# Patient Record
Sex: Male | Born: 1945
Health system: Southern US, Community
[De-identification: ages and names within clinical notes are randomized; demographics above are authoritative.]

## PROBLEM LIST (undated history)

## (undated) DIAGNOSIS — N179 Acute kidney failure, unspecified: Secondary | ICD-10-CM

## (undated) DIAGNOSIS — N400 Enlarged prostate without lower urinary tract symptoms: Secondary | ICD-10-CM

## (undated) DIAGNOSIS — M549 Dorsalgia, unspecified: Secondary | ICD-10-CM

## (undated) DIAGNOSIS — A498 Other bacterial infections of unspecified site: Secondary | ICD-10-CM

## (undated) DIAGNOSIS — G4733 Obstructive sleep apnea (adult) (pediatric): Secondary | ICD-10-CM

## (undated) DIAGNOSIS — G709 Myoneural disorder, unspecified: Secondary | ICD-10-CM

## (undated) DIAGNOSIS — G5790 Unspecified mononeuropathy of unspecified lower limb: Secondary | ICD-10-CM

## (undated) DIAGNOSIS — I10 Essential (primary) hypertension: Secondary | ICD-10-CM

## (undated) DIAGNOSIS — F32A Depression, unspecified: Secondary | ICD-10-CM

## (undated) DIAGNOSIS — F329 Major depressive disorder, single episode, unspecified: Secondary | ICD-10-CM

## (undated) DIAGNOSIS — M1990 Unspecified osteoarthritis, unspecified site: Secondary | ICD-10-CM

## (undated) DIAGNOSIS — R600 Localized edema: Secondary | ICD-10-CM

## (undated) DIAGNOSIS — K439 Ventral hernia without obstruction or gangrene: Secondary | ICD-10-CM

## (undated) DIAGNOSIS — F419 Anxiety disorder, unspecified: Secondary | ICD-10-CM

## (undated) DIAGNOSIS — I509 Heart failure, unspecified: Secondary | ICD-10-CM

## (undated) DIAGNOSIS — M545 Low back pain, unspecified: Secondary | ICD-10-CM

## (undated) DIAGNOSIS — E7800 Pure hypercholesterolemia, unspecified: Secondary | ICD-10-CM

## (undated) DIAGNOSIS — I82409 Acute embolism and thrombosis of unspecified deep veins of unspecified lower extremity: Secondary | ICD-10-CM

## (undated) DIAGNOSIS — E119 Type 2 diabetes mellitus without complications: Secondary | ICD-10-CM

## (undated) DIAGNOSIS — J189 Pneumonia, unspecified organism: Secondary | ICD-10-CM

## (undated) DIAGNOSIS — I82411 Acute embolism and thrombosis of right femoral vein: Secondary | ICD-10-CM

## (undated) DIAGNOSIS — M2550 Pain in unspecified joint: Secondary | ICD-10-CM

## (undated) DIAGNOSIS — I2699 Other pulmonary embolism without acute cor pulmonale: Secondary | ICD-10-CM

## (undated) DIAGNOSIS — D689 Coagulation defect, unspecified: Secondary | ICD-10-CM

## (undated) DIAGNOSIS — R519 Headache, unspecified: Secondary | ICD-10-CM

## (undated) DIAGNOSIS — C801 Malignant (primary) neoplasm, unspecified: Secondary | ICD-10-CM

## (undated) DIAGNOSIS — I4892 Unspecified atrial flutter: Secondary | ICD-10-CM

## (undated) DIAGNOSIS — R300 Dysuria: Secondary | ICD-10-CM

## (undated) DIAGNOSIS — K649 Unspecified hemorrhoids: Secondary | ICD-10-CM

## (undated) DIAGNOSIS — M109 Gout, unspecified: Secondary | ICD-10-CM

## (undated) DIAGNOSIS — A080 Rotaviral enteritis: Secondary | ICD-10-CM

## (undated) HISTORY — DX: Pain in unspecified joint: M25.50

## (undated) HISTORY — DX: Acute embolism and thrombosis of right femoral vein: I82.411

## (undated) HISTORY — DX: Major depressive disorder, single episode, unspecified: F32.9

## (undated) HISTORY — DX: Acute kidney failure, unspecified: N17.9

## (undated) HISTORY — DX: Acute embolism and thrombosis of unspecified deep veins of unspecified lower extremity: I82.409

## (undated) HISTORY — DX: Pure hypercholesterolemia, unspecified: E78.00

## (undated) HISTORY — DX: Unspecified osteoarthritis, unspecified site: M19.90

## (undated) HISTORY — DX: Myoneural disorder, unspecified: G70.9

## (undated) HISTORY — PX: HERNIA REPAIR: SHX51

## (undated) HISTORY — PX: EYE SURGERY: SHX253

## (undated) HISTORY — DX: Depression, unspecified: F32.A

## (undated) HISTORY — DX: Other bacterial infections of unspecified site: A49.8

## (undated) HISTORY — DX: Type 2 diabetes mellitus without complications: E11.9

## (undated) HISTORY — DX: Ventral hernia without obstruction or gangrene: K43.9

## (undated) HISTORY — DX: Coagulation defect, unspecified: D68.9

## (undated) HISTORY — DX: Benign prostatic hyperplasia without lower urinary tract symptoms: N40.0

## (undated) HISTORY — DX: Localized edema: R60.0

## (undated) HISTORY — DX: Obstructive sleep apnea (adult) (pediatric): G47.33

## (undated) HISTORY — DX: Other pulmonary embolism without acute cor pulmonale: I26.99

## (undated) HISTORY — DX: Essential (primary) hypertension: I10

## (undated) HISTORY — DX: Headache, unspecified: R51.9

## (undated) HISTORY — DX: Rotaviral enteritis: A08.0

## (undated) HISTORY — DX: Gout, unspecified: M10.9

## (undated) HISTORY — DX: Dysuria: R30.0

## (undated) HISTORY — DX: Unspecified hemorrhoids: K64.9

## (undated) HISTORY — PX: HX HEMORRHOIDECTOMY: SHX153

---

## 1997-06-15 HISTORY — PX: HERNIA REPAIR: SHX51

## 1997-12-22 ENCOUNTER — Emergency Department (HOSPITAL_COMMUNITY): Admission: EM | Admit: 1997-12-22 | Discharge: 1997-12-22 | Payer: Self-pay | Admitting: Emergency Medicine

## 1997-12-24 ENCOUNTER — Encounter: Admission: RE | Admit: 1997-12-24 | Discharge: 1998-03-22 | Payer: Self-pay | Admitting: Internal Medicine

## 2001-10-06 ENCOUNTER — Encounter (INDEPENDENT_AMBULATORY_CARE_PROVIDER_SITE_OTHER): Payer: Self-pay | Admitting: Specialist

## 2001-10-06 ENCOUNTER — Encounter: Payer: Self-pay | Admitting: General Surgery

## 2001-10-06 ENCOUNTER — Ambulatory Visit (HOSPITAL_COMMUNITY): Admission: RE | Admit: 2001-10-06 | Discharge: 2001-10-06 | Payer: Self-pay | Admitting: General Surgery

## 2001-10-26 ENCOUNTER — Ambulatory Visit (HOSPITAL_COMMUNITY): Admission: RE | Admit: 2001-10-26 | Discharge: 2001-10-26 | Payer: Self-pay | Admitting: General Surgery

## 2001-10-26 ENCOUNTER — Encounter: Payer: Self-pay | Admitting: General Surgery

## 2003-06-21 ENCOUNTER — Ambulatory Visit (HOSPITAL_BASED_OUTPATIENT_CLINIC_OR_DEPARTMENT_OTHER): Admission: RE | Admit: 2003-06-21 | Discharge: 2003-06-21 | Payer: Self-pay | Admitting: Internal Medicine

## 2004-05-19 ENCOUNTER — Ambulatory Visit (HOSPITAL_COMMUNITY): Admission: RE | Admit: 2004-05-19 | Discharge: 2004-05-19 | Payer: Self-pay | Admitting: Ophthalmology

## 2004-05-26 ENCOUNTER — Ambulatory Visit (HOSPITAL_COMMUNITY): Admission: RE | Admit: 2004-05-26 | Discharge: 2004-05-26 | Payer: Self-pay | Admitting: Ophthalmology

## 2004-11-20 ENCOUNTER — Ambulatory Visit: Payer: Self-pay | Admitting: Internal Medicine

## 2004-12-02 ENCOUNTER — Ambulatory Visit: Payer: Self-pay | Admitting: Internal Medicine

## 2006-03-30 ENCOUNTER — Ambulatory Visit: Payer: Self-pay | Admitting: Internal Medicine

## 2006-03-30 LAB — CONVERTED CEMR LAB
ALT: 19 units/L (ref 0–40)
AST: 18 units/L (ref 0–37)
Albumin: 3.6 g/dL (ref 3.5–5.2)
Alkaline Phosphatase: 75 units/L (ref 39–117)
BUN: 16 mg/dL (ref 6–23)
Basophils Absolute: 0 10*3/uL (ref 0.0–0.1)
Basophils Relative: 0.5 % (ref 0.0–1.0)
Bilirubin Urine: NEGATIVE
CO2: 27 meq/L (ref 19–32)
Calcium: 8.9 mg/dL (ref 8.4–10.5)
Chloride: 106 meq/L (ref 96–112)
Chol/HDL Ratio, serum: 5
Cholesterol: 144 mg/dL (ref 0–200)
Creatinine, Ser: 1 mg/dL (ref 0.4–1.5)
Eosinophil percent: 1.5 % (ref 0.0–5.0)
GFR calc non Af Amer: 81 mL/min
Glomerular Filtration Rate, Af Am: 98 mL/min/{1.73_m2}
Glucose, Bld: 108 mg/dL — ABNORMAL HIGH (ref 70–99)
HCT: 42 % (ref 39.0–52.0)
HDL: 28.6 mg/dL — ABNORMAL LOW (ref >39.0)
Hemoglobin, Urine: NEGATIVE
Hemoglobin: 14.3 g/dL (ref 13.0–17.0)
Ketones, ur: NEGATIVE mg/dL
LDL Cholesterol: 78 mg/dL (ref 0–99)
Leukocytes, UA: NEGATIVE
Lymphocytes Relative: 19.8 % (ref 12.0–46.0)
MCHC: 34 g/dL (ref 30.0–36.0)
MCV: 88.6 fL (ref 78.0–100.0)
Monocytes Absolute: 0.3 10*3/uL (ref 0.2–0.7)
Monocytes Relative: 5.1 % (ref 3.0–11.0)
Neutro Abs: 5.1 10*3/uL (ref 1.4–7.7)
Neutrophils Relative %: 73.1 % (ref 43.0–77.0)
Nitrite: NEGATIVE
PSA: 1.36 ng/mL (ref 0.10–4.00)
Platelets: 198 10*3/uL (ref 150–400)
Potassium: 4.2 meq/L (ref 3.5–5.1)
RBC: 4.74 M/uL (ref 4.22–5.81)
RDW: 13.5 % (ref 11.5–14.6)
Sodium: 140 meq/L (ref 135–145)
Specific Gravity, Urine: 1.02 (ref 1.000–1.03)
TSH: 2.15 microintl units/mL (ref 0.35–5.50)
Total Bilirubin: 0.5 mg/dL (ref 0.3–1.2)
Total Protein, Urine: NEGATIVE mg/dL
Total Protein: 6.2 g/dL (ref 6.0–8.3)
Triglyceride fasting, serum: 187 mg/dL — ABNORMAL HIGH (ref 0–149)
Urine Glucose: NEGATIVE mg/dL
Urobilinogen, UA: 0.2 (ref 0.0–1.0)
VLDL: 37 mg/dL (ref 0–40)
WBC: 6.8 10*3/uL (ref 4.5–10.5)
pH: 6 (ref 5.0–8.0)

## 2006-04-05 ENCOUNTER — Ambulatory Visit: Payer: Self-pay | Admitting: Internal Medicine

## 2006-05-31 ENCOUNTER — Ambulatory Visit: Payer: Self-pay | Admitting: Internal Medicine

## 2006-06-22 ENCOUNTER — Ambulatory Visit: Payer: Self-pay | Admitting: Internal Medicine

## 2007-03-29 ENCOUNTER — Encounter: Payer: Self-pay | Admitting: Internal Medicine

## 2007-03-29 ENCOUNTER — Ambulatory Visit: Payer: Self-pay | Admitting: Internal Medicine

## 2007-03-29 DIAGNOSIS — L0390 Cellulitis, unspecified: Secondary | ICD-10-CM

## 2007-03-29 DIAGNOSIS — N400 Enlarged prostate without lower urinary tract symptoms: Secondary | ICD-10-CM | POA: Insufficient documentation

## 2007-03-29 DIAGNOSIS — M1710 Unilateral primary osteoarthritis, unspecified knee: Secondary | ICD-10-CM | POA: Insufficient documentation

## 2007-03-29 DIAGNOSIS — F329 Major depressive disorder, single episode, unspecified: Secondary | ICD-10-CM | POA: Insufficient documentation

## 2007-03-29 DIAGNOSIS — I10 Essential (primary) hypertension: Secondary | ICD-10-CM | POA: Insufficient documentation

## 2007-03-29 DIAGNOSIS — G4733 Obstructive sleep apnea (adult) (pediatric): Secondary | ICD-10-CM | POA: Insufficient documentation

## 2007-03-29 DIAGNOSIS — F411 Generalized anxiety disorder: Secondary | ICD-10-CM | POA: Insufficient documentation

## 2007-03-29 DIAGNOSIS — F32A Depression, unspecified: Secondary | ICD-10-CM | POA: Insufficient documentation

## 2007-03-29 DIAGNOSIS — M169 Osteoarthritis of hip, unspecified: Secondary | ICD-10-CM | POA: Insufficient documentation

## 2007-03-29 DIAGNOSIS — H409 Unspecified glaucoma: Secondary | ICD-10-CM | POA: Insufficient documentation

## 2007-03-29 DIAGNOSIS — Z8669 Personal history of other diseases of the nervous system and sense organs: Secondary | ICD-10-CM | POA: Insufficient documentation

## 2007-03-29 DIAGNOSIS — L0291 Cutaneous abscess, unspecified: Secondary | ICD-10-CM | POA: Insufficient documentation

## 2007-03-29 DIAGNOSIS — Z87898 Personal history of other specified conditions: Secondary | ICD-10-CM | POA: Insufficient documentation

## 2007-06-02 ENCOUNTER — Encounter: Payer: Self-pay | Admitting: Internal Medicine

## 2007-06-03 ENCOUNTER — Telehealth: Payer: Self-pay | Admitting: Internal Medicine

## 2007-06-29 ENCOUNTER — Encounter: Payer: Self-pay | Admitting: *Deleted

## 2007-06-29 DIAGNOSIS — Z9889 Other specified postprocedural states: Secondary | ICD-10-CM | POA: Insufficient documentation

## 2007-06-29 DIAGNOSIS — M722 Plantar fascial fibromatosis: Secondary | ICD-10-CM | POA: Insufficient documentation

## 2007-09-13 ENCOUNTER — Ambulatory Visit: Payer: Self-pay | Admitting: Internal Medicine

## 2007-09-13 LAB — CONVERTED CEMR LAB
ALT: 18 units/L (ref 0–53)
AST: 19 units/L (ref 0–37)
Albumin: 3.3 g/dL — ABNORMAL LOW (ref 3.5–5.2)
Alkaline Phosphatase: 78 units/L (ref 39–117)
BUN: 17 mg/dL (ref 6–23)
Bacteria, UA: NEGATIVE
Basophils Absolute: 0 10*3/uL (ref 0.0–0.1)
Basophils Relative: 0.3 % (ref 0.0–1.0)
Bilirubin Urine: NEGATIVE
Bilirubin, Direct: 0.1 mg/dL (ref 0.0–0.3)
CO2: 30 meq/L (ref 19–32)
Calcium: 9 mg/dL (ref 8.4–10.5)
Chloride: 104 meq/L (ref 96–112)
Cholesterol: 133 mg/dL (ref 0–200)
Creatinine, Ser: 1 mg/dL (ref 0.4–1.5)
Crystals: NEGATIVE
Direct LDL: 65.9 mg/dL
Eosinophils Absolute: 0.2 10*3/uL (ref 0.0–0.7)
Eosinophils Relative: 2.1 % (ref 0.0–5.0)
GFR calc Af Amer: 97 mL/min
GFR calc non Af Amer: 80 mL/min
Glucose, Bld: 107 mg/dL — ABNORMAL HIGH (ref 70–99)
HCT: 43.1 % (ref 39.0–52.0)
HDL: 29 mg/dL — ABNORMAL LOW (ref >39.0)
Hemoglobin, Urine: NEGATIVE
Hemoglobin: 14 g/dL (ref 13.0–17.0)
Ketones, ur: NEGATIVE mg/dL
Leukocytes, UA: NEGATIVE
Lymphocytes Relative: 23.1 % (ref 12.0–46.0)
MCHC: 32.4 g/dL (ref 30.0–36.0)
MCV: 86.2 fL (ref 78.0–100.0)
Monocytes Absolute: 0.5 10*3/uL (ref 0.1–1.0)
Monocytes Relative: 6.9 % (ref 3.0–12.0)
Mucus, UA: NEGATIVE
Neutro Abs: 5.4 10*3/uL (ref 1.4–7.7)
Neutrophils Relative %: 67.6 % (ref 43.0–77.0)
Nitrite: NEGATIVE
PSA: 1.09 ng/mL (ref 0.10–4.00)
Platelets: 193 10*3/uL (ref 150–400)
Potassium: 4 meq/L (ref 3.5–5.1)
RBC: 5 M/uL (ref 4.22–5.81)
RDW: 13.5 % (ref 11.5–14.6)
Sodium: 143 meq/L (ref 135–145)
Specific Gravity, Urine: 1.02 (ref 1.000–1.03)
TSH: 2.28 microintl units/mL (ref 0.35–5.50)
Total Bilirubin: 0.7 mg/dL (ref 0.3–1.2)
Total CHOL/HDL Ratio: 4.6
Total Protein, Urine: 30 mg/dL — AB
Total Protein: 6.5 g/dL (ref 6.0–8.3)
Triglycerides: 219 mg/dL (ref 0–149)
Urine Glucose: NEGATIVE mg/dL
Urobilinogen, UA: 0.2 (ref 0.0–1.0)
VLDL: 44 mg/dL — ABNORMAL HIGH (ref 0–40)
WBC: 7.9 10*3/uL (ref 4.5–10.5)
pH: 6.5 (ref 5.0–8.0)

## 2007-09-16 ENCOUNTER — Ambulatory Visit: Payer: Self-pay | Admitting: Internal Medicine

## 2007-09-16 DIAGNOSIS — G4700 Insomnia, unspecified: Secondary | ICD-10-CM | POA: Insufficient documentation

## 2010-10-31 NOTE — Op Note (Signed)
[facility]  Patient:    AZARI, HASLER Visit Number: [id number] MRN: [id number]          Service Type: DSU Location: DAY Attending Physician:  Carson Myrtle Dictated by:   Sheppard Plumber Earlene Plater, M.D. Proc. Date: 10/06/01 Admit Date:  10/06/2001   CC:         Buren Kos, M.D.   Operative Report  PREOPERATIVE DIAGNOSIS:  Incarcerated umbilical hernia.  POSTOPERATIVE DIAGNOSIS:  Incarcerated umbilical hernia.  PROCEDURE:  Repair of incarcerated umbilical hernia with mesh.  SURGEON:  Timothy E. Earlene Plater, M.D.  ANESTHESIA:  General.  HISTORY:  Mr. Gunning is 65 years old, is rather significantly obese with a large abdominal girth. He is a heavy smoker and has an incarcerated umbilical hernia. After careful discussion, he wishes to have a repair. His preoperative chest x-ray is abnormal and will be followed up.  DESCRIPTION OF PROCEDURE:  The patient was evaluated by anesthesia, taken to the operating room, placed supine, general endotracheal anesthesia administered. The abdomen was shaved, scrubbed, prepped and draped in the usual fashion. A large incarcerated umbilical hernia was present. The area was anesthetized with 0.25% Marcaine with epinephrine. A supraumbilical curvilinear incision made. The hernia was in the subcutaneous tissue. It was dissected from the surrounding tissue down to its neck. The actual hernia defect was approximately 1.5 to 2 cm. The hernia contents were incarcerated, the sac was opened. The omentum was dissected off of the hernia sac and reduced into the abdomen. The hernia sac was excised. A circular portion of mesh was placed in the defect and anchored with four sutures of 0 Prolene to the surrounding strong fascia and then a running 2-0 Prolene was placed circling the mesh and attaching it to the fascia. With this, the procedure was complete. The deep subcutaneous was closed with 3-0 Monocryl and the skin with 3-0  Monocryl subcuticular. Steri-Strips were applied. A formed dressing was placed into the umbilical defect. He tolerated it well, was awakened and taken to the recovery room in good condition.  Written and verbal instructions were given including Percocet #36 and he will be followed as an outpatient. Dictated by:   Sheppard Plumber Earlene Plater, M.D. Attending Physician:  Carson Myrtle DD:  10/06/01 TD:  10/06/01 Job: [id number]

## 2010-10-31 NOTE — Assessment & Plan Note (Signed)
[facility]                             PRIMARY CARE OFFICE NOTE   NAME:[name] [name] MAHRT                       MRN:          [id number]  DATE:04/05/2006                            DOB:          03-25-1946    [name] [name] is a 65 year old Caucasian gentleman who is followed for:  1. History of right facial hemiparesis secondary to Bell's palsy.  2. History of glaucoma and status post iridotomies.  3. Obstructive sleep apnea, rated as mild by sleep study.  4. Hammertoe difficulties with painful feet.  5. The patient had a mild depression that was situational in nature.  6. Benign prostatic hypertrophy.  7. Obesity.   CHIEF COMPLAINT:  1. Easy bruising.  2. Pruritus in the EAC's. Swelling of the AC's and decreased hearing.   PAST MEDICAL HISTORY:   SURGICAL:  1. 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  2. Stress fracture left foot.  3. Umbilical hernia repair.   MEDICAL:  1. Usual childhood diseases.  2. Plantar fasciitis.  3. Degenerative joint disease knee and hip.  4. Benign prostatic hypertrophy.  5. Obstructive sleep apnea.  6. Obesity.  7. Anxiety with depression.   CURRENT MEDICATIONS:  1. Multivitamin daily.  2. Aspirin 81 mg daily.  3. Vitamin E 400 international units daily.  4. Wellbutrin XL 300 mg daily.  5. Avodart 0.5 mg daily.  6. Meloxicam 7.5 mg daily.  7. Glucosamine daily.   FAMILY HISTORY:  Positive for arthritis in his mother. Positive secondary  kinship for heart disease and stroke. Positive for diabetes in his mother.   SOCIAL HISTORY:  The patient has had a distinguished armed forces career.  The patient is married 38 years. He has two sons, ages 32 and 59. He has  three grandchildren. 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 He reports he is now restarting a  similar business working with his  son.   HABITS:  The patient has a greater than 40-pack year smoking history, but  has quit cigarettes. He rarely uses alcohol.   ALLERGIES:  No known drug allergies.   In my chart review, the patient had an MRI of the brain due to Bell's palsy,  which was read out showing abnormal asymmetric enhancement of the right  facial nerve associated with Bell's palsy. No other abnormalities were  otherwise noted.   REVIEW OF SYSTEMS:  The patient has had no fevers, sweats or chills. His  weight fluctuated some during the year, but he wound up with a net 1 pound  weight loss from last year. No opthalmic, ENT, cardiovascular, respiratory,  GI or GU complaints.   PHYSICAL EXAMINATION:  Temperature: 98.5. Blood pressure: 164/87. Pulse: 96.  Weight: 297.  GENERAL APPEARANCE: This is an obese, Caucasian male who looks his stated  age in no acute distress.  HEENT: Normocephalic, atraumatic. EAC's with cerumen, which irrigated  easily. Oropharynx  with native dentition with no lesions noted. The patient  has obvious lid lag on the right. Conjunctivae and sclerae was clear on both  eyes. Pupils equal, round and reactive to light and accommodation.  Funduscopic examination reveals normal disc margins with no abnormality.  Neck was supple without thyromegaly.  NODES: No adenopathy was noted in the cervical or supraclavicular regions.  CHEST: No costovertebral angle tenderness. The patient has mild gynecomastia  secondary to obesity.  LUNGS:  Clear to auscultation and percussion.  CARDIOVASCULAR: 2+ radial pulses. No JVD or carotid bruits. He had a quiet  precordium with regular rate and rhythm without murmurs, rubs or gallops.  ABDOMEN: Obese, soft. No guarding or rebound. Palpation was hindered by his  girth. He had no tenderness, no rebound, no guarding.  RECTAL: Revealed the patient to have external skin tags. He had normal  sphincter tone. Prostate was smooth,  round, normal in size and contour  without nodules.  EXTREMITIES: Without clubbing, cyanosis, edema and no deformities were  noted.  DERM: The patient has some small bruises on his upper extremities, which  appear to be capillary in nature with no significant deep bruising.  NEUROLOGIC: Was nonfocal.   DATABASE: Hemoglobin 14.3 grams. White count was 6,800 with a normal  differential. Cholesterol was 144, triglycerides 187, HDL 28.6, LDL was 78.  Chemistries revealed glucose 108, electrolytes were normal. Creatinine 1.0.  Liver functions were normal. A TSH normal at 2.15. PSA was normal at 1.36,  down from 3.30. Urinalysis was negative.   ASSESSMENT/PLAN:  1. Benign prostatic hypertrophy: Patient with significant improvement in      symptoms on Avodart which I believe is responsible for his drop in his      PSA.  Plan: The patient is to continue his present regimen.  2. Obstructive sleep apnea: The patient has not ever had consultation or      treatment. In reviewing his sleep study, he was read out as having mild-      moderate obstructive sleep apnea. The patient is asymptomatic at this      time. Plan: Weight loss. The patient is instructed if his symptoms      become worse including increased daytime somnolence then referral to a      sleep specialist would be appropriate.  3. Anxiety/depression: The patient reports he is doing well with      Wellbutrin XL 300 mg once daily and would like to continue the same.  4. Plantar fasciitis and foot pain: The patient is well-controlled on      meloxicam at this time.  5. Obesity: Discussed at length with the patient in regards to need for      weight loss. Counseled him on the need for daily exercise even if 15-20      minutes including just plain walking. He also is instructed in smart      food choices and in portion size restriction. Weight loss goal was one      pound per month on a steady and regular basis. 6. Neurology: The patient  with stable Bell's palsy. There has been no      change in his facial droop. At this point this is most likely a      permanent deficit. No further evaluation is needed at this time.  7. Health maintenance: I could not locate a colonoscopy on the chart and      the patient would be a good candidate for colonoscopy for  routine      colorectal cancer screening. The patient otherwise is currently up-to-      date with health screening. The patient did have a 12-lead      electrocardiogram today which revealed incomplete right bundle branch      block, possible left atrial enlargement and otherwise was unremarkable.   In summary, this is a very pleasant gentleman with his medical problems  essentially being stable. His biggest medical problem at this point is his  weight.   The patient did have his ears irrigated today as it was no trouble.            ______________________________  Rosalyn Gess. Norins, MD      MEN/MedQ  DD:  04/05/2006  DT:  04/06/2006  Job #:  [id number]

## 2011-08-21 ENCOUNTER — Emergency Department (HOSPITAL_COMMUNITY)
Admission: EM | Admit: 2011-08-21 | Discharge: 2011-08-21 | Disposition: A | Discharge status: Home / Self Care | Payer: Medicare Other | Attending: Emergency Medicine | Admitting: Emergency Medicine

## 2011-08-21 ENCOUNTER — Encounter (HOSPITAL_COMMUNITY): Payer: Self-pay | Admitting: Emergency Medicine

## 2011-08-21 DIAGNOSIS — I10 Essential (primary) hypertension: Secondary | ICD-10-CM | POA: Insufficient documentation

## 2011-08-21 DIAGNOSIS — M533 Sacrococcygeal disorders, not elsewhere classified: Secondary | ICD-10-CM | POA: Insufficient documentation

## 2011-08-21 DIAGNOSIS — M25559 Pain in unspecified hip: Secondary | ICD-10-CM | POA: Insufficient documentation

## 2011-08-21 HISTORY — DX: Essential (primary) hypertension: I10

## 2011-08-21 MED ORDER — HYDROCODONE-ACETAMINOPHEN 5-325 MG PO TABS: 1.0000 | ORAL_TABLET | ORAL | Status: AC | PRN

## 2011-08-21 MED ORDER — PREDNISONE 10 MG PO TABS: 40.0000 mg | ORAL_TABLET | Freq: Every day | ORAL | Status: DC

## 2011-08-21 NOTE — Discharge Instructions (Signed)
Sacroiliac Joint Dysfunction The sacroiliac joint connects the lower part of the spine (the sacrum) with the bones of the pelvis. CAUSES  Sometimes, there is no obvious reason for sacroiliac joint dysfunction. Other times, it may occur   During pregnancy.   After injury, such as:   Car accidents.   Sport-related injuries.   Work-related injuries.   Due to one leg being shorter than the other.   Due to other conditions that affect the joints, such as:   Rheumatoid arthritis.   Gout.   Psoriasis.   Joint infection (septic arthritis).  SYMPTOMS  Symptoms may include:  Pain in the:   Lower back.   Buttocks.   Groin.   Thighs and legs.   Difficult sitting, standing, walking, lying, bending or lifting.  DIAGNOSIS  A number of tests may be used to help diagnose the cause of sacroiliac joint dysfunction, including:  Imaging tests to look for other causes of pain, including:   MRI.   CT scan.   Bone scan.   Diagnostic injection: During a special x-ray (called fluoroscopy), a needle is put into the sacroiliac joint. A numbing medicine is injected into the joint. If the pain is improved or stopped, the diagnosis of sacroiliac joint dysfunction is more likely.  TREATMENT  There are a number of types of treatment used for sacroiliac joint dysfunction, including:  Only take over-the-counter or prescription medicines for pain, discomfort, or fever as directed by your caregiver.   Medications to relax muscles.   Rest. Decreasing activity can help cut down on painful muscle spasms and allow the back to heal.   Application of heat or ice to the lower back may improve muscle spasms and soothe pain.   Brace. A special back brace, called a sacroiliac belt, can help support the joint while your back is healing.   Physical therapy can help teach comfortable positions and exercises to strengthen muscles that support the sacroiliac joint.   Cortisone injections. Injections  of steroid medicine into the joint can help decrease swelling and improve pain.   Hyaluronic acid injections. This chemical improves lubrication within the sacroiliac joint, thereby decreasing pain.   Radiofrequency ablation. A special needle is placed into the joint, where it burns away nerves that are carrying pain messages from the joint.   Surgery. Because pain occurs during movement of the joint, screws and plates may be installed in order to limit or prevent joint motion.  HOME CARE INSTRUCTIONS   Take all medications exactly as directed.   Follow instructions regarding both rest and physical activity, to avoid worsening the pain.   Do physical therapy exercises exactly as prescribed.  SEEK IMMEDIATE MEDICAL CARE IF:  You experience increasingly severe pain.   You develop new symptoms, such as numbness or tingling in your legs or feet.   You lose bladder or bowel control.  Document Released: 08/28/2008 Document Revised: 05/21/2011 Document Reviewed: 08/28/2008 Exit[employer] Patient Information 2012 [employer]. 

## 2011-08-21 NOTE — ED Notes (Signed)
Pt goes to [place] for epidurals to relieve pain. C/o sacroiliac pain radiating down rt leg.

## 2011-08-21 NOTE — ED Provider Notes (Signed)
Medical screening examination/treatment/procedure(s) were performed by non-physician practitioner and as supervising physician I was immediately available for consultation/collaboration.  Ethelda Chick, MD 08/21/11 [phone]

## 2011-08-21 NOTE — ED Provider Notes (Signed)
History     CSN: [id number]  Arrival date & time 08/21/11  1028   First MD Initiated Contact with Patient 08/21/11 1209      Chief Complaint  Patient presents with  . Hip Pain    (Consider location/radiation/quality/duration/timing/severity/associated sxs/prior treatment) HPI Comments: Patient reports that three days ago he began having pain of the right buttocks.  He reports that the pain radiates down his right posterior leg.  Pain is constant and is worse with palpation.  No trauma or injury.  No fever/chills.  No erythema or swelling.  No numbness or tingling.  He is able to ambulate and move both of his hips.  He states he has a history of arthritis in his lower back.  He states that he is seen by the [place] in [place] and has been given epidural shots for his back pain.  Patient denies any bowel or urinary incontinence.    Patient is a 66 y.o. male presenting with hip pain. The history is provided by the patient.  Hip Pain This is a new problem. Episode onset: approximately 3 days ago. Pertinent negatives include no chills, diaphoresis, fever, joint swelling, neck pain, numbness, rash, vomiting or weakness. The symptoms are aggravated by walking. He has tried NSAIDs for the symptoms. The treatment provided mild relief.    Past Medical History  Diagnosis Date  . Hypertension     History reviewed. No pertinent past surgical history.  History reviewed. No pertinent family history.  History  Substance Use Topics  . Smoking status: Never Smoker   . Smokeless tobacco: Not on file  . Alcohol Use: Yes     occasional      Review of Systems  Constitutional: Negative for fever, chills and diaphoresis.  HENT: Negative for neck pain and neck stiffness.   Gastrointestinal: Negative for vomiting.  Genitourinary: Negative for hematuria and decreased urine volume.  Musculoskeletal: Positive for back pain. Negative for joint swelling and gait problem.  Skin: Negative for color change,  rash and wound.  Neurological: Negative for weakness and numbness.    Allergies  Codeine sulfate  Home Medications   Current Outpatient Rx  Name Route Sig Dispense Refill  . ASPIRIN EC 81 MG PO TBEC Oral Take 81 mg by mouth every evening.    Marland Kitchen BUPROPION HCL ER (SR) 150 MG PO TB12 Oral Take 150 mg by mouth 2 (two) times daily.    Marland Kitchen FINASTERIDE 5 MG PO TABS Oral Take 5 mg by mouth at bedtime.    Marland Kitchen GABAPENTIN 300 MG PO CAPS Oral Take 300 mg by mouth daily.    Marland Kitchen HYDROCODONE-ACETAMINOPHEN 7.5-750 MG PO TABS Oral Take 1 tablet by mouth every 6 (six) hours as needed. For pain    . THERA M PLUS PO TABS Oral Take 1 tablet by mouth daily.    . TRAMADOL HCL 50 MG PO TABS Oral Take 100 mg by mouth 3 (three) times daily.    Marland Kitchen ZOLPIDEM TARTRATE 10 MG PO TABS Oral Take 10 mg by mouth at bedtime as needed. For sleep      BP 139/76  Pulse 89  Temp 98.8 F (37.1 C)  Resp 20  SpO2 95%  Physical Exam  Nursing note and vitals reviewed. Constitutional: He is oriented to person, place, and time. He appears well-developed and well-nourished. No distress.  HENT:  Head: Normocephalic and atraumatic.  Neck: Normal range of motion. Neck supple.  Cardiovascular: Normal rate, regular rhythm and normal heart sounds.  Pulmonary/Chest: Effort normal and breath sounds normal.  Musculoskeletal: Normal range of motion. He exhibits no edema and no tenderness.       Right hip: He exhibits normal range of motion, no tenderness, no bony tenderness and no deformity.       Left hip: He exhibits normal range of motion, normal strength, no bony tenderness, no swelling and no deformity.       Tenderness to palpation of the posterior SI joint.  No erythema, edema, or warmth to the touch.    Neurological: He is alert and oriented to person, place, and time.  Skin: Skin is warm and dry. He is not diaphoretic. No erythema.  Psychiatric: He has a normal mood and affect.    ED Course  Procedures (including critical care  time)  Labs Reviewed - No data to display No results found.   No diagnosis found.    MDM  Patient with pain over the posterior SI joint.  Patient given short course of prednisone and instructed to follow up with his PCP.  No acute trauma or injury, therefore, did not order xrays.          Pascal [place], PA-C 08/21/11 [phone]

## 2011-08-21 NOTE — ED Notes (Signed)
Pt c/o right hip pain with radiation down right leg starting yesterday; pt denies hx of same; pt sts hx of chronic back pain

## 2013-02-12 ENCOUNTER — Ambulatory Visit (INDEPENDENT_AMBULATORY_CARE_PROVIDER_SITE_OTHER): Payer: Medicare Other | Admitting: Family Medicine

## 2013-02-12 ENCOUNTER — Ambulatory Visit (HOSPITAL_COMMUNITY)
Admission: RE | Admit: 2013-02-12 | Discharge: 2013-02-12 | Disposition: A | Discharge status: Home / Self Care | Payer: Medicare Other | Source: Ambulatory Visit | Attending: Family Medicine | Admitting: Family Medicine

## 2013-02-12 VITALS — BP 138/84 | HR 84 | Temp 98.2°F | Resp 16 | Ht 66.5 in | Wt 281.8 lb

## 2013-02-12 DIAGNOSIS — I824Y9 Acute embolism and thrombosis of unspecified deep veins of unspecified proximal lower extremity: Secondary | ICD-10-CM | POA: Insufficient documentation

## 2013-02-12 DIAGNOSIS — I82402 Acute embolism and thrombosis of unspecified deep veins of left lower extremity: Secondary | ICD-10-CM

## 2013-02-12 DIAGNOSIS — M7989 Other specified soft tissue disorders: Secondary | ICD-10-CM

## 2013-02-12 DIAGNOSIS — M79609 Pain in unspecified limb: Secondary | ICD-10-CM | POA: Insufficient documentation

## 2013-02-12 DIAGNOSIS — I82409 Acute embolism and thrombosis of unspecified deep veins of unspecified lower extremity: Secondary | ICD-10-CM

## 2013-02-12 DIAGNOSIS — G629 Polyneuropathy, unspecified: Secondary | ICD-10-CM

## 2013-02-12 DIAGNOSIS — G589 Mononeuropathy, unspecified: Secondary | ICD-10-CM

## 2013-02-12 LAB — COMPREHENSIVE METABOLIC PANEL WITH GFR
AST: 15 U/L (ref 0–37)
Albumin: 4.3 g/dL (ref 3.5–5.2)
Alkaline Phosphatase: 91 U/L (ref 39–117)
Glucose, Bld: 88 mg/dL (ref 70–99)
Potassium: 3.7 meq/L (ref 3.5–5.3)
Sodium: 138 meq/L (ref 135–145)
Total Bilirubin: 0.8 mg/dL (ref 0.3–1.2)
Total Protein: 6.7 g/dL (ref 6.0–8.3)

## 2013-02-12 LAB — POCT CBC
Granulocyte percent: 72.8 % (ref 37–80)
HCT, POC: 50.6 % (ref 43.5–53.7)
Hemoglobin: 16.5 g/dL (ref 14.1–18.1)
Lymph, poc: 2.2 (ref 0.6–3.4)
MCH, POC: 29.6 pg (ref 27–31.2)
MCHC: 32.6 g/dL (ref 31.8–35.4)
MCV: 90.9 fL (ref 80–97)
MID (cbc): 0.7 (ref 0–0.9)
MPV: 8.2 fL (ref 0–99.8)
POC Granulocyte: 7.6 — AB (ref 2–6.9)
POC LYMPH PERCENT: 20.9 % (ref 10–50)
POC MID %: 6.3 % (ref 0–12)
Platelet Count, POC: 240 10*3/uL (ref 142–424)
RBC: 5.57 M/uL (ref 4.69–6.13)
RDW, POC: 15.1 %
WBC: 10.5 10*3/uL — AB (ref 4.6–10.2)

## 2013-02-12 LAB — COMPREHENSIVE METABOLIC PANEL
ALT: 18 U/L (ref 0–53)
BUN: 23 mg/dL (ref 6–23)
CO2: 34 mEq/L — ABNORMAL HIGH (ref 19–32)
Calcium: 9.6 mg/dL (ref 8.4–10.5)
Chloride: 95 mEq/L — ABNORMAL LOW (ref 96–112)
Creat: 1.24 mg/dL (ref 0.50–1.35)

## 2013-02-12 LAB — POCT URINALYSIS DIPSTICK
Bilirubin, UA: NEGATIVE
Blood, UA: NEGATIVE
Glucose, UA: NEGATIVE
Ketones, UA: NEGATIVE
Leukocytes, UA: NEGATIVE
Nitrite, UA: NEGATIVE
Spec Grav, UA: >=1.03
Urobilinogen, UA: 0.2
pH, UA: 5.5

## 2013-02-12 MED ORDER — RIVAROXABAN 20 MG PO TABS: 20.0000 mg | ORAL_TABLET | Freq: Every day | ORAL | Status: DC

## 2013-02-12 MED ORDER — RIVAROXABAN 15 MG PO TABS: 15.0000 mg | ORAL_TABLET | Freq: Two times a day (BID) | ORAL | Status: DC

## 2013-02-12 NOTE — Patient Instructions (Addendum)
Go to [facility] ER and let them know that you are there for a Venous Doppler.Deep Vein Thrombosis A deep vein thrombosis (DVT) is a blood clot that develops in a deep vein. A DVT is a clot in the deep, larger veins of the leg, arm, or pelvis. These are more dangerous than clots that might form in veins near the surface of the body. A DVT can lead to complications if the clot breaks off and travels in the bloodstream to the lungs.  A DVT can damage the valves in your leg veins, so that instead of flowing upwards, the blood pools in the lower leg. This is called post-thrombotic syndrome, and can result in pain, swelling, discoloration, and sores on the leg. Once identified, a DVT can be treated. It can also be prevented in some circumstances. Once you have had a DVT, you may be at increased risk for a DVT in the future. CAUSES Blood clots form in a vein for different reasons. Usually several things contribute to blood clots. Contributing factors include:  The flow of blood slows down.  The inside of the vein is damaged in some way.  The person has a condition that makes blood clot more easily. Some people are more likely than others to develop blood clots. That is because they have more factors that make clots likely. These are called risk factors. Risk factors include:   Older age, especially over 45 years old.  Having a history of blood clots. This means you have had one before. Or, it means that someone else in your family has had blood clots. You may have a genetic tendency to form clots.  Having major or lengthy surgery. This is especially true for surgery on the hip, knee, or belly (abdomen). Hip surgery is particularly high risk.  Breaking a hip or leg.  Sitting or lying still for a long time. This includes long distance travel, paralysis, or recovery from an illness or surgery.  Cancer, or cancer treatment.  Having a long, thin tube (catheter) placed inside a vein during a medical  procedure.  Being overweight (obese).  Pregnancy and childbirth. Hormone changes make the blood clot more easily during pregnancy. The fetus puts pressure on the veins of the pelvis. There is also risk of injury to veins during delivery or a caesarean. The risk is at its highest just after childbirth.  Medicines with the male hormone estrogen. This includes birth control pills and hormone replacement therapy.  Smoking.  Other circulation or heart problems. SYMPTOMS When a clot forms, it can either partially or totally block the blood flow in that vein. Symptoms of a DVT can include:  Swelling of the leg or arm, especially if one side is much worse.  Warmth and redness of the leg or arm, especially if one side is much worse.  Pain in an arm or leg. If the clot is in the leg, symptoms may be more noticeable or worse when standing or walking. The symptoms of a DVT that has traveled to the lungs (pulmonary embolism, PE) usually start suddenly, and include:  Shortness of breath.  Coughing.  Coughing up blood or blood-tinged phlegm.  Chest pain. The chest pain is often worse with deep breaths.  Rapid heartbeat. Anyone with these symptoms should get emergency medical treatment right away. Call your local emergency services (911 in [place] if you have these symptoms. DIAGNOSIS If a DVT is suspected, your caregiver will take a full medical history and carry out  a physical exam. Tests that also may be required include:  Blood tests, including studies of the clotting properties of the blood.  Ultrasonography to see if you have clots in your legs or lungs.  X-rays to show the flow of blood when dye is injected into the veins (venography).  Studies of your lungs, if you have any chest symptoms. PREVENTION  Exercise the legs regularly. Take a brisk 30 minute walk every day.  Maintain a weight that is appropriate for your height.  Avoid sitting or lying in bed for long periods of time  without moving your legs.  Women, particularly those over the age of 52, should consider the risks and benefits of taking estrogen medicines, including birth control pills.  Do not smoke, especially if you take estrogen medicines.  Long distance travel can increase your risk of DVT. You should exercise your legs by walking or pumping the muscles every hour.  In-hospital prevention:  Many of the risk factors above relate to situations that exist with hospitalization, either for illness, injury, or elective surgery.  Your caregiver will assess you for the need for venous thromboembolism prophylaxis when you are admitted to the hospital. If you are having surgery, your surgeon will assess you the day of or day after surgery.  Prevention may include medical and nonmedical measures. TREATMENT Treatment for DVT helps prevent death and disability. The most common treatment for DVT is blood thinning (anticoagulant) medicine, which reduces the blood's tendency to clot. Anticoagulants can stop new blood clots from forming and old ones from growing. They cannot dissolve existing clots. Your body does this by itself over time. Anticoagulants can be given by mouth, by intravenous (IV) access, or by injection. Your caregiver will determine the best program for you.  Heparin or related medicines (low molecular weight heparin) are usually the first treatment for a blood clot. They act quickly. However, they cannot be taken orally.  Heparin can cause a fall in a component of blood that stops bleeding and forms blood clots (platelets). You will be monitored with blood tests to be sure this does not occur.  Warfarin is an anticoagulant that can be swallowed (taken orally). It takes a few days to start working, so usually heparin or related medicines are used in combination. Once warfarin is working, heparin is usually stopped.  Less commonly, clot dissolving drugs (thrombolytics) are used to dissolve a DVT.  They carry a high risk of bleeding, so they are used mainly in severe cases, where a life or limb is threatened.  Very rarely, a blood clot in the leg needs to be removed surgically.  If you are unable to take anticoagulants, your caregiver may arrange for you to have a filter placed in a main vein in your belly (abdomen). This filter prevents clots from traveling to your lungs. HOME CARE INSTRUCTIONS  Take all medicines prescribed by your caregiver. Follow the directions carefully.  Warfarin. Most people will continue taking warfarin after hospital discharge. Your caregiver will advise you on the length of treatment (usually 3 6 months, sometimes lifelong).  Too much and too little warfarin are both dangerous. Too much warfarin increases the risk of bleeding. Too little warfarin continues to allow the risk for blood clots. While taking warfarin, you will need to have regular blood tests to measure your blood clotting time. These blood tests usually include both the prothrombin time (PT) and international normalized ratio (INR) tests. The PT and INR results allow your caregiver to adjust  your dose of warfarin. The dose can change for many reasons. It is critically important that you take warfarin exactly as prescribed, and that you have your PT and INR levels drawn exactly as directed.  Many foods, especially foods high in vitamin K can interfere with warfarin and affect the PT and INR results. Foods high in vitamin K include spinach, kale, broccoli, cabbage, collard and turnip greens, brussels sprouts, peas, cauliflower, seaweed, and parsley as well as beef and pork liver, green tea, and soybean oil. You should eat a consistent amount of foods high in vitamin K. Avoid major changes in your diet, or notify your caregiver before changing your diet. Arrange a visit with a dietitian to answer your questions.  Many medicines can interfere with warfarin and affect the PT and INR results. You must tell your  caregiver about any and all medicines you take, this includes all vitamins and supplements. Be especially cautious with aspirin and anti-inflammatory medicines. Ask your caregiver before taking these. Do not take or discontinue any prescribed or over-the-counter medicine except on the advice of your caregiver or pharmacist.  Warfarin can have side effects, primarily excessive bruising or bleeding. You will need to hold pressure over cuts for longer than usual. Your caregiver or pharmacist will discuss other potential side effects.  Alcohol can change the body's ability to handle warfarin. It is best to avoid alcoholic drinks or consume only very small amounts while taking warfarin. Notify your caregiver if you change your alcohol intake.  Notify your dentist or other caregivers before procedures.  Activity. Ask your caregiver how soon you can go back to normal activities. It is important to stay active to prevent blood clots. If you are on anticoagulant medicine, avoid contact sports.  Exercise. It is very important to exercise. This is especially important while traveling, sitting or standing for long periods of time. Exercise your legs by walking or by pumping the muscles frequently. Take frequent walks.  Compression stockings. These are tight elastic stockings that apply pressure to the lower legs. This pressure can help keep the blood in the legs from clotting. You may need to wear compressions stockings at home to help prevent a DVT.  Smoking. If you smoke, quit. Ask your caregiver for help with quitting smoking.  Learn as much as you can about DVT. Knowing more about the condition should help you keep it from coming back.  Wear a medical alert bracelet or carry a medical alert card. SEEK MEDICAL CARE IF:  You notice a rapid heartbeat.  You feel weaker or more tired than usual.  You feel faint.  You notice increased bruising.  You feel your symptoms are not getting better in the  time expected.  You believe you are having side effects of medicine. SEEK IMMEDIATE MEDICAL CARE IF:  You have chest pain.  You have trouble breathing.  You have new or increased swelling or pain in one leg.  You cough up blood.  You notice blood in vomit, in a bowel movement, or in urine. MAKE SURE YOU:  Understand these instructions.  Will watch your condition.  Will get help right away if you are not doing well or get worse. Document Released: 06/01/2005 Document Revised: 02/24/2012 Document Reviewed: 07/24/2010 [facility] Patient Information 2014 [place], [place].

## 2013-02-12 NOTE — Progress Notes (Signed)
[facility]:  Office Visit  Chief Complaint:  Chief Complaint  Patient presents with  . Leg Pain    edema x 3-4 dys  . Foot Swelling    x 3-4 dys    HPI: Tanner [name] is a 67 y.o. male who complains of  Left leg swelling x 3-4 days.  He denies SOB, cough, fevers or chills There has been significant swelling in left leg He has some color change and redness He normally weighs  170 lbs and has been stable at that weight, denies CP, PND, orthopnea He took 40 mg of his wife's lasix x 2 days without relief of swelling, he has propped up his leg without releif as well He denies recent long travels, prior DVT, malignancy, trauma/surgery. He gets up every hr, but admits to being sedentary Normal swelling in leg is usually in both ankles and goes away in 1-2 days No new meds besides lasix.  He has neuropathy in his L3-5 spin and so cannot tell me if his leg swelling is painful. He states it is "uncomfortable"    Past Medical History  Diagnosis Date  . Hypertension   . Depression   . Neuromuscular disorder    Past Surgical History  Procedure Laterality Date  . Eye surgery    . Hernia repair     History   Social History  . Marital Status: Married    Spouse Name: N/A    Number of Children: N/A  . Years of Education: N/A   Social History Main Topics  . Smoking status: Never Smoker   . Smokeless tobacco: None  . Alcohol Use: Yes     Comment: occasional  . Drug Use: No  . Sexual Activity: None   Other Topics Concern  . None   Social History Narrative  . None   History reviewed. No pertinent family history. Allergies  Allergen Reactions  . Codeine Sulfate    Prior to Admission medications   Medication Sig Start Date End Date Taking? Authorizing Provider  aspirin EC 81 MG tablet Take 81 mg by mouth every evening.   Yes Historical Provider, MD  buPROPion (WELLBUTRIN SR) 150 MG 12 hr tablet Take 150 mg by mouth 2 (two) times daily.   Yes Historical  Provider, MD  finasteride (PROSCAR) 5 MG tablet Take 5 mg by mouth at bedtime.   Yes Historical Provider, MD  gabapentin (NEURONTIN) 300 MG capsule Take 300 mg by mouth daily.   Yes Historical Provider, MD  HYDROcodone-acetaminophen (VICODIN ES) 7.5-750 MG per tablet Take 1 tablet by mouth every 6 (six) hours as needed. For pain   Yes Historical Provider, MD  Multiple Vitamins-Minerals (MULTIVITAMINS THER. W/MINERALS) TABS Take 1 tablet by mouth daily.   Yes Historical Provider, MD  traMADol (ULTRAM) 50 MG tablet Take 100 mg by mouth 3 (three) times daily.   Yes Historical Provider, MD  zolpidem (AMBIEN) 10 MG tablet Take 10 mg by mouth at bedtime as needed. For sleep   Yes Historical Provider, MD     ROS: The patient denies fevers, chills, night sweats, unintentional weight loss, chest pain, palpitations, wheezing, dyspnea on exertion, nausea, vomiting, abdominal pain, dysuria, hematuria, melena, + numbness, weakness, or tingling.   All other systems have been reviewed and were otherwise negative with the exception of those mentioned in the HPI and as above.    PHYSICAL EXAM: Filed Vitals:   02/12/13 1351  BP: 138/84  Pulse: 84  Temp:  98.2 F (36.8 C)  Resp: 16   Filed Vitals:   02/12/13 1351  Height: 5' 6.5" (1.689 m)  Weight: 281 lb 12.8 oz (127.824 kg)   Body mass index is 44.81 kg/(m^2).  General: Alert, no acute distress HEENT:  Normocephalic, atraumatic, oropharynx patent. EOMI, PERRLA Cardiovascular:  Regular rate and rhythm, no rubs murmurs or gallops.  No Carotid bruits, radial pulse intact.+ left leg edema Respiratory: Clear to auscultation bilaterally.  No wheezes, rales, or rhonchi.  No cyanosis, no use of accessory musculature GI: No organomegaly, abdomen is soft and non-tender, positive bowel sounds.  No masses. Skin: No rashes. Neurologic: Facial musculature asymmetric. + right sided Bell's Palsy. Psychiatric: Patient is appropriate throughout our  interaction. Lymphatic: No cervical lymphadenopathy Musculoskeletal: Gait intact. Left leg 13.5 inches distal tibfib and right is 11 inches  + nonpitting edema Slightly ruddy color   LABS: Results for orders placed in visit on 02/12/13  POCT CBC      Result Value Range   WBC 10.5 (*) 4.6 - 10.2 K/uL   Lymph, poc 2.2  0.6 - 3.4   POC LYMPH PERCENT 20.9  10 - 50 %L   MID (cbc) 0.7  0 - 0.9   POC MID % 6.3  0 - 12 %M   POC Granulocyte 7.6 (*) 2 - 6.9   Granulocyte percent 72.8  37 - 80 %G   RBC 5.57  4.69 - 6.13 M/uL   Hemoglobin 16.5  14.1 - 18.1 g/dL   HCT, POC 45.4  09.8 - 53.7 %   MCV 90.9  80 - 97 fL   MCH, POC 29.6  27 - 31.2 pg   MCHC 32.6  31.8 - 35.4 g/dL   RDW, POC 11.9     Platelet Count, POC 240  142 - 424 K/uL   MPV 8.2  0 - 99.8 fL  POCT URINALYSIS DIPSTICK      Result Value Range   Color, UA dark yellow     Clarity, UA clear     Glucose, UA neg     Bilirubin, UA neg     Ketones, UA neg     Spec Grav, UA >=1.030     Blood, UA neg     pH, UA 5.5     Protein, UA trace     Urobilinogen, UA 0.2     Nitrite, UA neg     Leukocytes, UA Negative       EKG/XRAY:   Primary read interpreted by Dr. Conley Rolls at [facility].   ASSESSMENT/PLAN: Encounter Diagnoses  Name Primary?  . Left leg swelling Yes  . Neuropathy     Rule out DVT, WBC nonimpressive for cellulitis Will get stat venous doppler He is not on amlodipine Will follow-up with him on next steps once get doppler results.   Rockne Coons, DO 02/12/2013 3:04 PM   Patient came back to discuss DVT and also rx Rx Xarelto 15 mg BID x 21 days then 20 mg daily Refer to Dr Twanna Hy for further management, advise to go to ER prn for worsening sxs or f/u with [name] if he has not gotten referral in 1 week with Dr. Pearlie Oyster Advise to avoid exertional actvity and compression of Tanner [name] Gross sideeffects, risk and benefits, and alternatives of medications d/w patient. Patient is aware that all medications have potential  sideeffects and we are unable to predict every sideeffect or drug-drug interaction that may occur.   Summary: Findings consistent with occlusive,  acute deep vein thrombosis involving the femoral, popliteal and posterior tibial veins of the left lower extremity. There is also non occlusive, acute DVT noted in the left common femoral vein. No propagation noted to the right lower extremity.

## 2013-02-12 NOTE — Progress Notes (Signed)
VASCULAR LAB PRELIMINARY  PRELIMINARY  PRELIMINARY  PRELIMINARY  Left lower extremity venous Doppler completed.    Preliminary report:  There is acute, occlusive DVT noted in the left posterior tibial, popliteal, and common femoral veins.  There is acute, non occlusive DVT noted in the left common femoral vein.  No propagation of DVT noted to the right lower extremity.  Mushka Laconte, RVT 02/12/2013, 4:42 PM

## 2013-02-14 LAB — HYPERCOAGULABLE PANEL, COMPREHENSIVE
AntiThromb III Func: 101 % (ref 76–126)
Anticardiolipin IgA: 2 U/mL (ref <22)
Anticardiolipin IgG: 5 GPL U/mL (ref <23)
Anticardiolipin IgM: 2 [MPL'U]/mL (ref <11)
Beta-2 Glyco I IgG: 3 G Units (ref <20)
Beta-2-Glycoprotein I IgA: 0 A Units (ref <20)
Beta-2-Glycoprotein I IgM: 0 M Units (ref <20)
DRVVT: 33.1 secs (ref <42.9)
Lupus Anticoagulant: NOT DETECTED
PTT Lupus Anticoagulant: 30.9 s (ref 28.0–43.0)
Protein C Activity: 154 % — ABNORMAL HIGH (ref 75–133)
Protein C, Total: 89 % (ref 72–160)
Protein S Activity: 65 % — ABNORMAL LOW (ref 69–129)
Protein S Total: 77 % (ref 60–150)

## 2013-02-15 ENCOUNTER — Telehealth: Payer: Self-pay | Admitting: Hematology & Oncology

## 2013-02-15 NOTE — Telephone Encounter (Signed)
Wife called wanting to schedule appointment. She is aware we got referral yesterday and that MD is reviewing.

## 2013-02-15 NOTE — Telephone Encounter (Signed)
Pt aware of 02-17-13 appointment °

## 2013-02-17 ENCOUNTER — Ambulatory Visit (HOSPITAL_BASED_OUTPATIENT_CLINIC_OR_DEPARTMENT_OTHER)
Admission: RE | Admit: 2013-02-17 | Discharge: 2013-02-17 | Disposition: A | Discharge status: Home / Self Care | Payer: Medicare Other | Source: Ambulatory Visit | Attending: Hematology & Oncology | Admitting: Hematology & Oncology

## 2013-02-17 ENCOUNTER — Ambulatory Visit: Payer: Medicare Other

## 2013-02-17 ENCOUNTER — Encounter (HOSPITAL_BASED_OUTPATIENT_CLINIC_OR_DEPARTMENT_OTHER): Payer: Self-pay | Admitting: Emergency Medicine

## 2013-02-17 ENCOUNTER — Inpatient Hospital Stay (HOSPITAL_BASED_OUTPATIENT_CLINIC_OR_DEPARTMENT_OTHER)
Admission: EM | Admit: 2013-02-17 | Discharge: 2013-02-21 | DRG: 176 | Disposition: A | Discharge status: Home / Self Care | Payer: Medicare Other | Attending: Internal Medicine | Admitting: Internal Medicine

## 2013-02-17 ENCOUNTER — Ambulatory Visit (HOSPITAL_BASED_OUTPATIENT_CLINIC_OR_DEPARTMENT_OTHER): Payer: Medicare Other | Admitting: Hematology & Oncology

## 2013-02-17 ENCOUNTER — Other Ambulatory Visit: Payer: Medicare Other | Admitting: Lab

## 2013-02-17 ENCOUNTER — Other Ambulatory Visit: Payer: Self-pay

## 2013-02-17 VITALS — BP 124/52 | HR 67 | Temp 98.2°F | Resp 20 | Ht 67.0 in | Wt 283.0 lb

## 2013-02-17 DIAGNOSIS — N400 Enlarged prostate without lower urinary tract symptoms: Secondary | ICD-10-CM | POA: Diagnosis present

## 2013-02-17 DIAGNOSIS — I82402 Acute embolism and thrombosis of unspecified deep veins of left lower extremity: Secondary | ICD-10-CM | POA: Diagnosis present

## 2013-02-17 DIAGNOSIS — R1032 Left lower quadrant pain: Secondary | ICD-10-CM

## 2013-02-17 DIAGNOSIS — R059 Cough, unspecified: Secondary | ICD-10-CM

## 2013-02-17 DIAGNOSIS — Z79899 Other long term (current) drug therapy: Secondary | ICD-10-CM

## 2013-02-17 DIAGNOSIS — F3289 Other specified depressive episodes: Secondary | ICD-10-CM | POA: Diagnosis present

## 2013-02-17 DIAGNOSIS — R030 Elevated blood-pressure reading, without diagnosis of hypertension: Secondary | ICD-10-CM

## 2013-02-17 DIAGNOSIS — R05 Cough: Secondary | ICD-10-CM

## 2013-02-17 DIAGNOSIS — R609 Edema, unspecified: Secondary | ICD-10-CM

## 2013-02-17 DIAGNOSIS — H409 Unspecified glaucoma: Secondary | ICD-10-CM

## 2013-02-17 DIAGNOSIS — I2699 Other pulmonary embolism without acute cor pulmonale: Principal | ICD-10-CM | POA: Diagnosis present

## 2013-02-17 DIAGNOSIS — I10 Essential (primary) hypertension: Secondary | ICD-10-CM | POA: Diagnosis present

## 2013-02-17 DIAGNOSIS — G5790 Unspecified mononeuropathy of unspecified lower limb: Secondary | ICD-10-CM | POA: Diagnosis present

## 2013-02-17 DIAGNOSIS — G4700 Insomnia, unspecified: Secondary | ICD-10-CM

## 2013-02-17 DIAGNOSIS — I82409 Acute embolism and thrombosis of unspecified deep veins of unspecified lower extremity: Secondary | ICD-10-CM | POA: Diagnosis present

## 2013-02-17 DIAGNOSIS — E669 Obesity, unspecified: Secondary | ICD-10-CM | POA: Diagnosis present

## 2013-02-17 DIAGNOSIS — F329 Major depressive disorder, single episode, unspecified: Secondary | ICD-10-CM | POA: Diagnosis present

## 2013-02-17 HISTORY — DX: Dorsalgia, unspecified: M54.9

## 2013-02-17 HISTORY — DX: Unspecified mononeuropathy of unspecified lower limb: G57.90

## 2013-02-17 HISTORY — DX: Low back pain: M54.5

## 2013-02-17 HISTORY — DX: Low back pain, unspecified: M54.50

## 2013-02-17 LAB — CBC WITH DIFFERENTIAL/PLATELET
Basophils Absolute: 0 10*3/uL (ref 0.0–0.1)
Eosinophils Absolute: 0.1 10*3/uL (ref 0.0–0.7)
Lymphocytes Relative: 18 % (ref 12–46)
Monocytes Relative: 6 % (ref 3–12)
Platelets: 207 10*3/uL (ref 150–400)
RDW: 14.9 % (ref 11.5–15.5)
WBC: 11.4 10*3/uL — ABNORMAL HIGH (ref 4.0–10.5)

## 2013-02-17 LAB — BASIC METABOLIC PANEL
CO2: 32 mEq/L (ref 19–32)
Calcium: 9.9 mg/dL (ref 8.4–10.5)
Chloride: 93 mEq/L — ABNORMAL LOW (ref 96–112)
GFR calc Af Amer: 70 mL/min — ABNORMAL LOW (ref >90)
Sodium: 137 mEq/L (ref 135–145)

## 2013-02-17 LAB — PROTIME-INR
INR: 1.31 (ref 0.00–1.49)
Prothrombin Time: 16 seconds — ABNORMAL HIGH (ref 11.6–15.2)

## 2013-02-17 LAB — APTT: aPTT: 34 seconds (ref 24–37)

## 2013-02-17 MED ORDER — IOHEXOL 300 MG/ML  SOLN
100.0000 mL | Freq: Once | INTRAMUSCULAR | Status: AC | PRN
  Administered 2013-02-17: 100 mL via INTRAVENOUS

## 2013-02-17 MED ORDER — HEPARIN (PORCINE) IN NACL 100-0.45 UNIT/ML-% IJ SOLN
INTRAMUSCULAR | Status: AC
  Filled 2013-02-17: qty 250

## 2013-02-17 MED ORDER — HEPARIN SODIUM (PORCINE) 5000 UNIT/ML IJ SOLN: 60.0000 [IU]/kg | Freq: Once | INTRAMUSCULAR | Status: DC

## 2013-02-17 MED ORDER — HEPARIN (PORCINE) IN NACL 100-0.45 UNIT/ML-% IJ SOLN: 12.0000 [IU]/kg/h | Freq: Once | INTRAMUSCULAR | Status: DC

## 2013-02-17 MED ORDER — HEPARIN (PORCINE) IN NACL 100-0.45 UNIT/ML-% IJ SOLN
1500.0000 [IU]/h | INTRAMUSCULAR | Status: AC
  Administered 2013-02-17: 1700 [IU]/h via INTRAVENOUS
  Administered 2013-02-18 – 2013-02-19 (×3): 1500 [IU]/h via INTRAVENOUS
  Filled 2013-02-17 (×6): qty 250

## 2013-02-17 NOTE — ED Notes (Signed)
heparin checked with TM RN

## 2013-02-17 NOTE — ED Notes (Signed)
Pt brought to Ed from radiology where he was having an outpatient CTA.  Bilateral PE found on CT. Pt diagnosed with left leg DVT earlier this week.

## 2013-02-17 NOTE — Progress Notes (Signed)
This office note has been dictated.

## 2013-02-17 NOTE — ED Provider Notes (Signed)
CSN: [id number]     Arrival date & time 02/17/13  1729 History   First MD Initiated Contact with Patient 02/17/13 1739     Chief Complaint  Patient presents with  . Shortness of Breath    HPI Patient presents to the emergency room after being diagnosed with a pulmonary embolism as an outpatient. The patient was sent to the emergency room to arrange for a hospital admission. Patient states he was diagnosed with a DVT about a week ago. He experienced pain and swelling in his left leg. He saw his doctor and had an outpatient ultrasound which confirmed a pulmonary embolism. The patient was seen Dr. Robbi Garter today as an outpatient. Patient states he had a CT scan to evaluate the source of his blood clot. Patient states he was not having any trouble with any chest pain or shortness of breath. He continues to be asymptomatic other than the calf pain and discomfort.  Past Medical History  Diagnosis Date  . Hypertension   . Depression   . Neuromuscular disorder   . Back pain   . Low back pain potentially associated with spinal stenosis   . Neuropathy of lower extremity     bilateral   Past Surgical History  Procedure Laterality Date  . Eye surgery    . Hernia repair     No family history on file. History  Substance Use Topics  . Smoking status: Former Games developer  . Smokeless tobacco: Never Used  . Alcohol Use: Yes     Comment: occasional    Review of Systems  All other systems reviewed and are negative.    Allergies  Codeine sulfate  Home Medications   Current Outpatient Rx  Name  Route  Sig  Dispense  Refill  . atenolol (TENORMIN) 25 MG tablet   Oral   Take 12.5 mg by mouth daily.         . hydrochlorothiazide (HYDRODIURIL) 25 MG tablet   Oral   Take 25 mg by mouth daily.         Marland Kitchen ketoconazole (NIZORAL) 2 % shampoo   Topical   Apply topically daily.         . Menthol-Methyl Salicylate (THERA-GESIC PLUS) CREA   Apply externally   Apply topically.         Marland Kitchen  aspirin EC 81 MG tablet   Oral   Take 81 mg by mouth every evening.         Marland Kitchen buPROPion (WELLBUTRIN SR) 150 MG 12 hr tablet   Oral   Take 150 mg by mouth 2 (two) times daily.         . finasteride (PROSCAR) 5 MG tablet   Oral   Take 5 mg by mouth at bedtime.         . gabapentin (NEURONTIN) 300 MG capsule   Oral   Take 300 mg by mouth daily.         . Multiple Vitamins-Minerals (MULTIVITAMINS THER. W/MINERALS) TABS   Oral   Take 1 tablet by mouth daily.         . Rivaroxaban (XARELTO) 20 MG TABS tablet   Oral   Take 20 mg by mouth daily.         . traMADol (ULTRAM) 50 MG tablet   Oral   Take 100 mg by mouth 3 (three) times daily. Takes 2 tabs         . zolpidem (AMBIEN) 10 MG tablet   Oral  Take 10 mg by mouth at bedtime as needed. For sleep          BP 139/77  Pulse 72  Temp(Src) 98.6 F (37 C) (Oral)  Resp 19  Ht 5' 6.93" (1.7 m)  Wt 283 lb 1.1 oz (128.4 kg)  BMI 44.43 kg/m2  SpO2 98% Physical Exam  Nursing note and vitals reviewed. Constitutional: He appears well-developed and well-nourished. No distress.  Obese  HENT:  Head: Normocephalic and atraumatic.  Right Ear: External ear normal.  Left Ear: External ear normal.  Eyes: Conjunctivae are normal. Right eye exhibits no discharge. Left eye exhibits no discharge. No scleral icterus.  Neck: Neck supple. No tracheal deviation present.  Cardiovascular: Normal rate, regular rhythm and intact distal pulses.   Pulmonary/Chest: Effort normal and breath sounds normal. No stridor. No respiratory distress. He has no wheezes. He has no rales.  Abdominal: Soft. Bowel sounds are normal. He exhibits no distension. There is no tenderness. There is no rebound and no guarding.  Musculoskeletal: He exhibits edema and tenderness.  Swelling and tenderness of the left calf mild erythema  Neurological: He is alert. He has normal strength. No sensory deficit. Cranial nerve deficit:  no gross defecits noted. He  exhibits normal muscle tone. He displays no seizure activity. Coordination normal.  Skin: Skin is warm and dry. No rash noted.  Psychiatric: He has a normal mood and affect.    ED Course  Procedures (including critical care time)  EKG Normal sinus rhythm rate 80 Right bundle branch block Normal ST T waves No significant change when compared to EKG dated 06 October 2001 Labs Review Labs Reviewed  PROTIME-INR - Abnormal; Notable for the following:    Prothrombin Time 16.0 (*)    All other components within normal limits  CBC WITH DIFFERENTIAL - Abnormal; Notable for the following:    WBC 11.4 (*)    Neutro Abs 8.5 (*)    All other components within normal limits  BASIC METABOLIC PANEL - Abnormal; Notable for the following:    Chloride 93 (*)    GFR calc non Af Amer 60 (*)    GFR calc Af Amer 70 (*)    All other components within normal limits  APTT  HEPARIN LEVEL (UNFRACTIONATED)   Imaging Review Ct Chest W Contrast  02/17/2013   *RADIOLOGY REPORT*  Clinical Data:  Cough.  New deep venous thrombosis identified in the left leg.  Abdominal pain.  CT CHEST, ABDOMEN AND PELVIS WITH CONTRAST  Technique:  Multidetector CT imaging of the chest, abdomen and pelvis was performed following the standard protocol during bolus administration of intravenous contrast.  Contrast: OMNIPAQUE IOHEXOL 300 MG/ML  SOLN,  Comparison:  No priors.  CT CHEST  Findings:  Mediastinum: There are multiple filling defects within the pulmonary arterial tree in the lungs bilaterally (right greater than left), compatible with acute pulmonary embolus.  The largest burden of clot is in the distal right main pulmonary artery extending predominately into lobar, segmental and subsegmental sized branches to the right lower lobe.  Several of these appear completely occluded.  There is also predominately nonocclusive embolus to the distal left main pulmonary artery extending into the left lower lobe segmental and  subsegmental sized branches.  Heart size is normal. There is no significant pericardial fluid, thickening or pericardial calcification. No pathologically enlarged mediastinal or hilar lymph nodes. Esophagus is unremarkable in appearance.  Lungs/Pleura: Minimal subsegmental atelectasis or scarring in the medial segment of the right  middle lobe and inferior segment of the lingula.  No acute consolidative airspace disease to suggest hemorrhage from pulmonary infarction, or underlying infection.  No suspicious appearing pulmonary nodule or mass.  No pneumothorax.  Musculoskeletal: There are no aggressive appearing lytic or blastic lesions noted in the visualized portions of the skeleton.  IMPRESSION:  1.  Study is positive for acute pulmonary embolus in the lungs bilaterally, as detailed above.  No current findings to suggest pulmonary infarction are identified at this time.  CT ABDOMEN AND PELVIS  Findings:  Abdomen/Pelvis: Low attenuation in the hepatic parenchyma, suggestive of hepatic steatosis (this is difficult to confirm on a contrast enhanced CT scan).  No focal hepatic lesions.  The appearance of the gallbladder, pancreas, spleen and bilateral adrenal glands is unremarkable.  There are several low attenuation lesions in the kidneys bilaterally, largest of which measures 3.8 cm in diameter extending exophytically off the lower pole of the left kidney, compatible with multiple simple cysts.  The deep venous system of the abdomen, pelvis and visualized upper thighs appears widely patent without definite filling defect to suggest deep venous thrombosis in these regions at this time.  No significant volume of ascites.  No pneumoperitoneum.  No pathologic distension of small bowel.  No definite pathologic lymphadenopathy identified within the abdomen or pelvis.  Normal appendix.  Mild atherosclerosis throughout the abdominal and pelvic vasculature, without evidence of aneurysm or dissection.  Prostate gland and  urinary bladder are unremarkable in appearance.  Musculoskeletal: There are no aggressive appearing lytic or blastic lesions noted in the visualized portions of the skeleton.  IMPRESSION:  1.  No acute findings in the abdomen or pelvis. 2.  Probable hepatic steatosis. 3.  Multiple simple cysts in the kidneys bilaterally.  These results were called by telephone on 02/17/2013 at 05:20 p.m. to Dr. Myna Hidalgo, who verbally acknowledged these results.   Original Report Authenticated By: Trudie Reed, M.D.   Ct Abdomen Pelvis W Contrast  02/17/2013   *RADIOLOGY REPORT*  Clinical Data:  Cough.  New deep venous thrombosis identified in the left leg.  Abdominal pain.  CT CHEST, ABDOMEN AND PELVIS WITH CONTRAST  Technique:  Multidetector CT imaging of the chest, abdomen and pelvis was performed following the standard protocol during bolus administration of intravenous contrast.  Contrast: OMNIPAQUE IOHEXOL 300 MG/ML  SOLN,  Comparison:  No priors.  CT CHEST  Findings:  Mediastinum: There are multiple filling defects within the pulmonary arterial tree in the lungs bilaterally (right greater than left), compatible with acute pulmonary embolus.  The largest burden of clot is in the distal right main pulmonary artery extending predominately into lobar, segmental and subsegmental sized branches to the right lower lobe.  Several of these appear completely occluded.  There is also predominately nonocclusive embolus to the distal left main pulmonary artery extending into the left lower lobe segmental and subsegmental sized branches.  Heart size is normal. There is no significant pericardial fluid, thickening or pericardial calcification. No pathologically enlarged mediastinal or hilar lymph nodes. Esophagus is unremarkable in appearance.  Lungs/Pleura: Minimal subsegmental atelectasis or scarring in the medial segment of the right middle lobe and inferior segment of the lingula.  No acute consolidative airspace disease to  suggest hemorrhage from pulmonary infarction, or underlying infection.  No suspicious appearing pulmonary nodule or mass.  No pneumothorax.  Musculoskeletal: There are no aggressive appearing lytic or blastic lesions noted in the visualized portions of the skeleton.  IMPRESSION:  1.  Study is  positive for acute pulmonary embolus in the lungs bilaterally, as detailed above.  No current findings to suggest pulmonary infarction are identified at this time.  CT ABDOMEN AND PELVIS  Findings:  Abdomen/Pelvis: Low attenuation in the hepatic parenchyma, suggestive of hepatic steatosis (this is difficult to confirm on a contrast enhanced CT scan).  No focal hepatic lesions.  The appearance of the gallbladder, pancreas, spleen and bilateral adrenal glands is unremarkable.  There are several low attenuation lesions in the kidneys bilaterally, largest of which measures 3.8 cm in diameter extending exophytically off the lower pole of the left kidney, compatible with multiple simple cysts.  The deep venous system of the abdomen, pelvis and visualized upper thighs appears widely patent without definite filling defect to suggest deep venous thrombosis in these regions at this time.  No significant volume of ascites.  No pneumoperitoneum.  No pathologic distension of small bowel.  No definite pathologic lymphadenopathy identified within the abdomen or pelvis.  Normal appendix.  Mild atherosclerosis throughout the abdominal and pelvic vasculature, without evidence of aneurysm or dissection.  Prostate gland and urinary bladder are unremarkable in appearance.  Musculoskeletal: There are no aggressive appearing lytic or blastic lesions noted in the visualized portions of the skeleton.  IMPRESSION:  1.  No acute findings in the abdomen or pelvis. 2.  Probable hepatic steatosis. 3.  Multiple simple cysts in the kidneys bilaterally.  These results were called by telephone on 02/17/2013 at 05:20 p.m. to Dr. Myna Hidalgo, who verbally  acknowledged these results.   Original Report Authenticated By: Trudie Reed, M.D.    MDM   1. Pulmonary embolism      The patient had been taking Xarelto as an outpatient for his DVT. He remains asymptomatic with his pulmonary embolism. It is unclear if he or he had these pulmonary emboli at the time that he was started on Xarelto for his DVT or after the fact.  I suspect that may be the former as he continues to be asymptomatic without chest pain or shortness of breath. He cannot confirm this however.  I spoke with Dr Fonnie Mu, who sent him to the ED, and she wants the patient started on IV heparin and admitted to the hospital.    Celene Kras, MD 02/17/13 [phone]

## 2013-02-17 NOTE — Progress Notes (Signed)
ANTICOAGULATION CONSULT NOTE - Initial Consult  Pharmacy Consult for heparin Indication: pulmonary embolus  Allergies  Allergen Reactions  . Codeine Sulfate     Patient Measurements:   Heparin Dosing Weight: 101 kg  Vital Signs: Temp: 98.6 F (37 C) (09/05 1738) Temp src: Oral (09/05 1738) BP: 152/79 mmHg (09/05 1806) Pulse Rate: 80 (09/05 1806)  Labs: No results found for this basename: HGB, HCT, PLT, APTT, LABPROT, INR, HEPARINUNFRC, CREATININE, CKTOTAL, CKMB, TROPONINI,  in the last 72 hours  The CrCl is unknown because both a height and weight (above a minimum accepted value) are required for this calculation.   Medical History: Past Medical History  Diagnosis Date  . Hypertension   . Depression   . Neuromuscular disorder   . Back pain   . Low back pain potentially associated with spinal stenosis   . Neuropathy of lower extremity     bilateral    Medications:    Assessment: L leg DVT + B PE found on outpatient CT Heparin Dosing Weight: 101 kg  67 y/o M patient of Dr. Myna Hidalgo has been on Xarelto approx. 1 week for L leg DVT now found to have B PE on CT. Dr. Myna Hidalgo would like patient admitted and started on IV heparin for possible Xarelto failure (spoke with Dr. Lynelle Doctor at [facility]). Last dose of Xarelto was this am 9/5 at 0900 per patient Aurther Loft RN).   Labs: Scr 1.2, Hgb 15.8, Plts 207, Baseline INR 1.3 likely elevated secondary to Xarelto  Goal of Therapy:  Heparin level 0.3-0.7 units/ml Monitor platelets by anticoagulation protocol: Yes   Plan:  Heparin infusion (no bolus due to recent Xarelto administration) at 1700 units/hr. Check heparin level in 6 hrs and daily.   Misty Stanley Stillinger 02/17/2013,6:37 PM

## 2013-02-18 DIAGNOSIS — I82402 Acute embolism and thrombosis of unspecified deep veins of left lower extremity: Secondary | ICD-10-CM | POA: Diagnosis present

## 2013-02-18 DIAGNOSIS — Z7901 Long term (current) use of anticoagulants: Secondary | ICD-10-CM

## 2013-02-18 DIAGNOSIS — I82409 Acute embolism and thrombosis of unspecified deep veins of unspecified lower extremity: Secondary | ICD-10-CM

## 2013-02-18 DIAGNOSIS — I2699 Other pulmonary embolism without acute cor pulmonale: Secondary | ICD-10-CM | POA: Diagnosis present

## 2013-02-18 LAB — CBC
HCT: 44.3 % (ref 39.0–52.0)
MCV: 85.2 fL (ref 78.0–100.0)
Platelets: 213 10*3/uL (ref 150–400)
RBC: 5.2 MIL/uL (ref 4.22–5.81)
WBC: 10.9 10*3/uL — ABNORMAL HIGH (ref 4.0–10.5)

## 2013-02-18 LAB — BASIC METABOLIC PANEL
CO2: 27 mEq/L (ref 19–32)
Chloride: 96 mEq/L (ref 96–112)
Creatinine, Ser: 1.12 mg/dL (ref 0.50–1.35)
Potassium: 3.2 mEq/L — ABNORMAL LOW (ref 3.5–5.1)

## 2013-02-18 LAB — HEPARIN LEVEL (UNFRACTIONATED)
Heparin Unfractionated: 0.95 IU/mL — ABNORMAL HIGH (ref 0.30–0.70)
Heparin Unfractionated: 0.45 IU/mL (ref 0.30–0.70)

## 2013-02-18 MED ORDER — ZOLPIDEM TARTRATE 5 MG PO TABS
5.0000 mg | ORAL_TABLET | Freq: Every day | ORAL | Status: DC
  Administered 2013-02-18 – 2013-02-20 (×4): 5 mg via ORAL
  Filled 2013-02-18 (×4): qty 1

## 2013-02-18 MED ORDER — BUPROPION HCL ER (SR) 150 MG PO TB12
150.0000 mg | ORAL_TABLET | Freq: Two times a day (BID) | ORAL | Status: DC
  Administered 2013-02-18 – 2013-02-21 (×6): 150 mg via ORAL
  Filled 2013-02-18 (×8): qty 1

## 2013-02-18 MED ORDER — FINASTERIDE 5 MG PO TABS
5.0000 mg | ORAL_TABLET | Freq: Every day | ORAL | Status: DC
  Administered 2013-02-18 – 2013-02-20 (×3): 5 mg via ORAL
  Filled 2013-02-18 (×4): qty 1

## 2013-02-18 MED ORDER — FAMOTIDINE IN NACL 20-0.9 MG/50ML-% IV SOLN
20.0000 mg | INTRAVENOUS | Status: DC
  Filled 2013-02-18: qty 50

## 2013-02-18 MED ORDER — GABAPENTIN 400 MG PO CAPS
400.0000 mg | ORAL_CAPSULE | Freq: Every day | ORAL | Status: DC
  Administered 2013-02-18 – 2013-02-20 (×3): 400 mg via ORAL
  Filled 2013-02-18 (×4): qty 1

## 2013-02-18 MED ORDER — GABAPENTIN 400 MG PO CAPS
400.0000 mg | ORAL_CAPSULE | Freq: Once | ORAL | Status: AC
  Administered 2013-02-18: 400 mg via ORAL
  Filled 2013-02-18: qty 1

## 2013-02-18 MED ORDER — ONDANSETRON HCL 4 MG/2ML IJ SOLN: 4.0000 mg | Freq: Four times a day (QID) | INTRAMUSCULAR | Status: DC | PRN

## 2013-02-18 MED ORDER — DEXAMETHASONE SODIUM PHOSPHATE 10 MG/ML IJ SOLN
10.0000 mg | INTRAMUSCULAR | Status: DC
  Filled 2013-02-18: qty 1

## 2013-02-18 MED ORDER — SODIUM CHLORIDE 0.9 % IJ SOLN
3.0000 mL | Freq: Two times a day (BID) | INTRAMUSCULAR | Status: DC
  Administered 2013-02-18 – 2013-02-20 (×4): 3 mL via INTRAVENOUS

## 2013-02-18 MED ORDER — SODIUM CHLORIDE 0.9 % IV SOLN
500.0000 mg | INTRAVENOUS | Status: DC
  Filled 2013-02-18: qty 5

## 2013-02-18 MED ORDER — TRAMADOL HCL 50 MG PO TABS
100.0000 mg | ORAL_TABLET | Freq: Three times a day (TID) | ORAL | Status: DC
  Administered 2013-02-18 – 2013-02-21 (×11): 100 mg via ORAL
  Filled 2013-02-18 (×9): qty 2
  Filled 2013-02-18 (×2): qty 1
  Filled 2013-02-18: qty 2

## 2013-02-18 MED ORDER — BUPROPION HCL ER (SR) 150 MG PO TB12
150.0000 mg | ORAL_TABLET | Freq: Once | ORAL | Status: AC
  Administered 2013-02-18: 150 mg via ORAL
  Filled 2013-02-18: qty 1

## 2013-02-18 MED ORDER — FINASTERIDE 5 MG PO TABS
5.0000 mg | ORAL_TABLET | Freq: Once | ORAL | Status: AC
  Administered 2013-02-18: 5 mg via ORAL
  Filled 2013-02-18: qty 1

## 2013-02-18 NOTE — Progress Notes (Signed)
ANTICOAGULATION CONSULT NOTE - Follow-up  Pharmacy Consult for heparin Indication: pulmonary embolus  Allergies  Allergen Reactions  . Codeine Sulfate     Patient Measurements: Height: 5\' 6"  (167.6 cm) Weight: 281 lb 1.4 oz (127.5 kg) IBW/kg (Calculated) : 63.8 Heparin Dosing Weight: 101 kg  Vital Signs: Temp: 98.6 F (37 C) (09/06 0525) Temp src: Oral (09/06 0525) BP: 108/70 mmHg (09/06 0525) Pulse Rate: 73 (09/06 0525)  Labs:  Recent Labs  02/17/13 1846 02/18/13 0540 02/18/13 0810  HGB 15.8 15.4  --   HCT 47.0 44.3  --   PLT 207 213  --   APTT 34  --   --   LABPROT 16.0*  --   --   INR 1.31  --   --   HEPARINUNFRC  --   --  0.95*  CREATININE 1.20 1.12  --     Estimated Creatinine Clearance: 79.7 ml/min (by C-G formula based on Cr of 1.12).  Assessment:  67 yom with L leg DVT + B PE found on outpatient CT previously on Xarelto. Initial heparin level is supratherapeutic at 0.95. CBC is WNL, no bleeding noted. Baseline PTT had been WNL.   Goal of Therapy:  Heparin level 0.3-0.7 units/ml Monitor platelets by anticoagulation protocol: Yes   Plan:  1. Reduce heparin gtt to 1500 units/hr 2. Check an 8 hour heparin level  Lysle Pearl, PharmD, BCPS Pager # [phone] 02/18/2013 9:06 AM

## 2013-02-18 NOTE — Consult Note (Signed)
NAMEMarland Kitchen  ZACHARIAS, RIDLING NO.:  [id number]  MEDICAL RECORD NO.:  [id number]  LOCATION:  [id number]                        FACILITY:  [facility]  PHYSICIAN:  Josph Macho, M.D.  DATE OF BIRTH:  03-Jan-1946  DATE OF CONSULTATION: DATE OF DISCHARGE:                                CONSULTATION   REFERRING PHYSICIAN:  Dr. Jerald Kief.  REASON FOR CONSULTATION: 1. Acute pulmonary embolism-bilateral. 2. Left lower extremity deep venous thrombosis.  HISTORY OF PRESENT ILLNESS:  Mr. Omlor is a very nice 67 year old white gentleman.  I initially saw him in the office on September 5.  At that point in time, he presented with a DVT of the left leg a week ago.  He did have hypercoagulable studies done.  All studies were pretty much came back normal.  His protein S was minimally depressed at 65.  He was put on Xarelto.  He said his leg was getting better with less swelling.  He does not have any cough or shortness of breath.  There was no chest wall pain.  He had no hemoptysis.  I did go ahead and got the scans on him.  We did CT of the chest, abdomen and pelvis.  The CT of the abdomen and pelvis did not show any pathology below the diaphragm.  He may have had a fatty liver.  However, a CT of the chest showed multiple filling defects within the pulmonary artery tree.  He had bilateral pulmonary emboli.  These were occlusive. No pulmonary hemorrhage was noted.  There is no pulmonary infarction was noted.  As such, he was admitted.  He now is on IV heparin.  Again he has been totally asymptomatic.  PAST MEDICAL HISTORY:  Remarkable for; 1. Hypertension. 2. Depression. 3. Lower extremity neuropathy. 4. Chronic back pain.  ALLERGIES:  Codeine.  MEDICATIONS:  Atenolol 12.5 mg p.o. daily, hydrochlorothiazide 25 mg p.o. daily, aspirin 81 mg p.o. daily, Wellbutrin SR 150 mg p.o. b.i.d., Proscar 5 mg p.o. at bedtime, Neurontin 300 mg p.o. q. day, Ambien 10 mg p.o. at  bedtime, Ultram 100 mg p.o. t.i.d. and Xarelto 20 mg p.o. daily.  SOCIAL HISTORY:  Remarkable for past tobacco use.  There is no significant alcohol use.  FAMILY HISTORY:  Noncontributory for any thromboembolic event.  REVIEW OF SYSTEMS:  As stated in history of present illness.  PHYSICAL EXAMINATION:  GENERAL:  This is a mildly obese white gentleman in no obvious distress. VITAL SIGNS:  Temperature 98.6 pulse 73, respiratory rate 17, blood pressure 108/70. HEAD AND NECK:  No ocular or oral lesions.  There are no palpable cervical or supraclavicular lymph nodes. LUNGS:  Clear bilaterally. CARDIAC:  Regular rate and rhythm with no murmurs, rubs, or bruits. ABDOMEN:  Soft.  He is somewhat obese.  He has decent bowel sounds. There is no fluid wave.  There is no palpable hepatosplenomegaly. EXTREMITIES:  Some mild nonpitting edema of the left leg.  No obvious venous cords noted in the legs. NEUROLOGICAL:  No focal neurological deficits.  LABORATORY STUDIES:  White cell count 10.9, hemoglobin 15.4, hematocrit 44.3, platelet count 213.  His sodium 136, potassium 3.2, BUN 16,  creatinine 1.12.  INR is 1.31.  PTT 34.  IMPRESSION:  Mr. Peddie is a 67 year old gentleman with deep venous thrombosis of the left lower extremity.  He unfortunately now has bilateral pulmonary emboli.  The real problem that we have is that I do not know if these pulmonary emboli developed while on Xarelto or if he had these all along.  Since we are not sure what the "sequence of events are", I think we going to have to get him on Coumadin as the next preferred anticoagulant.  I will clearly keep him on heparin over the weekend.  I would then get him on Arixtra for about 3 weeks.  I think that we really need to get an aggressive anticoagulation program for him.  I think that once we do the 3 weeks of the Arixtra, then I would get him on Coumadin.  I suspect that he likely will need anticoagulation for a good  year.  I am just surprised that he is asymptomatic.  We will go ahead and plan to follow along.  I very much appreciate the great care that he is getting over at [facility] [place].     Josph Macho, M.D.     PRE/MEDQ  D:  02/18/2013  T:  02/18/2013  Job:  [id number]

## 2013-02-18 NOTE — Progress Notes (Signed)
DIAGNOSIS:  Idiopathic deep vein thrombosis of the left leg.  HISTORY OF PRESENT ILLNESS:  [name] is a really nice 67 year old white gentleman.  He has been in pretty decent health.  He has history of hypertension.  He just woke up,I think last Wednesday or Thursday, with swelling in his left leg.  From the notes that I have from Dr. Conley Rolls, apparent there has been some swelling for about 3 or 4 days.  He has not had any recent trips.  He has not been immobilized.  There has been no change in medications.  He does not smoke currently, although he used to smoke several years ago.  He subsequently went to the emergency room.  He had a Doppler done.  The Doppler showed an acute thrombus in the left femoral, popliteal and posterior tibial veins.  He did have a nonocclusive thrombus in the left common femoral vein.  He had a hypercoagulable panel done at that time.  Everything was essentially negative.  He had a minimally depressed protein S level of 65%.  Factor V and prothrombin II gene mutations were all negative.  Again, he was placed on Xarelto at 15 mg twice a day.  We were then asked to see him for further recommendations.  He says his leg is feeling a little bit better.  It is still swollen.  He has had no abdominal pain.  He has had no cough or shortness of breath.  There is no hemoptysis.  He has had no back pain.  He does have some epidural injections because of chronic back issues.  There have been no rashes.  He has had no ecchymoses.  He has had no dysphagia or odynophagia.  There have been no visual issues.  Overall, his performance status has been quite good.  Again, were asked to see him to help with any management issues.  PAST MEDICAL HISTORY:  Remarkable for: 1. Hypertension. 2. Depression. 3. "Neuromuscular disease.". 4. Umbilical hernia repair. 5. Bell palsy on the right face with some ptosis of the right eye. 6. BPH. 7. Insomnia.  ALLERGIES:   Codeine.  MEDICATIONS: 1. Aspirin 81 mg p.o. daily (his wife says he has not taken this for     awhile). 2. Wellbutrin SR 150 mg p.o. b.i.d. 3. Proscar 5 mg p.o. q.h.s. 4. Neurontin 300 mg p.o. daily. 5. Vicodin 7.5/750 one p.o. q.6 hours p.r.n. 6. Tramadol 100 mg p.o. t.i.d. 7. Ambien 10 mg p.o. q.h.s.  SOCIAL HISTORY:  Remarkable for past tobacco use.  He probably has about a 50 pack year history of tobacco use.  He stopped I think about 12 years ago.  There is some alcohol use but very mild.  He has no obvious occupational exposures.  FAMILY HISTORY:  Negative for any thromboembolic events.  There are no miscarriages in the family.  There are no family members who died early of a heart attack or stroke.  REVIEW OF SYSTEMS:  This is unremarkable.  A 12-system review was negative.  PHYSICAL EXAMINATION:  General:  This is an obese white gentleman in no obvious distress.  Vital signs:  Temperature of 98.2, pulse 65, respiratory rate 20, blood pressure 124/52.  Weight is 283.  Head and neck:  Normocephalic, atraumatic skull.  There are no ocular or oral lesions.  There are no palpable cervical or supraclavicular lymph nodes. Lungs:  Clear bilaterally.  Cardiac:  Regular rate and rhythm with a normal S1 and S2.  There are no  murmurs, rubs or bruits.  Abdomen: Soft.  He is moderately obese.  He has decent bowel sounds.  There is no fluid wave.  There is no palpable hepatosplenomegaly.  Extremities show 2+ nonpitting edema of the left leg.  There may be some slight plethora. The swelling is mostly below the knee.  He has decent range motion of his joints.  He has good pulses in his distal extremities.  Neurologic: No focal neurological deficits.  LABORATORY STUDIES:  Not done this visit as he just had these done 4 days ago.  IMPRESSION:  [name] is a 67 year old gentleman with a deep vein thrombosis of the left leg.  I would have to suspect that this is idiopathic.  Again, the  protein S level is minimally depressed, and I really cannot get "too excited" about this.  I do think that we should get a CT scan of his chest, abdomen and pelvis.  He does have history of tobacco use.  I think it is important that we make sure that there is no underlying malignancy that could have precipitated this.  I agree with the Xarelto.  I think this is appropriate for him.  He will need a year total of Xarelto.  I probably would repeat his protein S levels in about 2 months or so.  I believe he is going to need a compression stocking for his left leg. I am really worried about the possibility of postphlebitic syndrome. This would be a devastating issue for him and would really minimize and limit his quality of life.  [name] and his wife are both very, very nice.  I had good fellowship with them.  They have a strong faith.  I want to see him back in another 6 weeks.  Again, I do not see any contraindication to him being on Xarelto.  I think this would be a good choice for him and would make life a lot easier for him so that he would not have to be tested every couple weeks with poor venous access.    ______________________________ Josph Macho, M.D. PRE/MEDQ  D:  02/17/2013  T:  02/18/2013  Job:  [id number]

## 2013-02-18 NOTE — Progress Notes (Signed)
Subjective: Tanner [name] was last seen at [facility] in 2009. He was lost to follow up having switched his care to the [place]. He reports that in the interval he has not had any serious medical illness, had eye surgery and no major injury. He has been treated for HTN, BPH.   August 31 he presented to Urgent Care for leg swelling. Was found to have DVT and was started on Xeralto. Despite NOAC he developed multiple pulmonary emboli and Dr. Myna Hidalgo, who was consults for coagulopathy, referred to ED for admission for IV anticoagulation.  Currently he is feeling OK. Denies any chest pain, SOB. He has not been active so he does not know if he has exertional dyspnea.    Objective: Lab:  Recent Labs  02/17/13 1846 02/18/13 0540  WBC 11.4* 10.9*  NEUTROABS 8.5*  --   HGB 15.8 15.4  HCT 47.0 44.3  MCV 85.6 85.2  PLT 207 213    Recent Labs  02/17/13 1846 02/18/13 0540  NA 137 136  K 3.8 3.2*  CL 93* 96  GLUCOSE 96 115*  BUN 17 16  CREATININE 1.20 1.12  CALCIUM 9.9 9.5   INR 1.31, Heparin level 0.95   Imaging:  Scheduled Meds: . sodium chloride  3 mL Intravenous Q12H  . traMADol  100 mg Oral TID  . zolpidem  5 mg Oral QHS   Continuous Infusions: . heparin 1,500 Units/hr (02/18/13 0945)   PRN Meds:.ondansetron (ZOFRAN) IV   Physical Exam: Filed Vitals:   02/18/13 0525  BP: 108/70  Pulse: 73  Temp: 98.6 F (37 C)  Resp: 17   Gen'l - obese white man in no acute distress HEENT C&S clear, PERRLA, droop of the right eyelid Cor- RRR PUlm - good breath sounds, no increased WOB Abd - obese Neuro - A&O x 3      Assessment/Plan: 1. Coagulopathy - pt has had DVT and PE. Not known if he had PE while on Xeralto but this has been stopped. Dr. Myna Hidalgo is consulting hematologist and full coag work-up is deferred to him. His recommendations have been reviewed:Heparin over the weekend that Arixtra 10 mg sq daily x 21 days then convert to coumadin.  Plan Plan as  above.  2. HTN - adequate control  3. BPH- stable.   Illene Regulus [facility] (o) [phone]; (c) [phone] Call-grp - [facility]  Tele: [phone]  02/18/2013, 11:12 AM

## 2013-02-18 NOTE — H&P (Signed)
[facility] History and Physical  Tanner [name] ZOX:[id number] DOB: July 11, 1945 DOA: 02/17/2013  Referring physician: ED PCP: Illene Regulus, MD , Saw [facility] for diagnosis of DVT  Chief Complaint: PE  HPI: Tanner [name] is a 67 y.o. male who developed leg swelling on 02/12/13.  He presented to [facility] and was diagnosed with occlusive LLE DVT fairly extensive LLE clot burden.  He was stared on Xarelto and referred to Dr. Myna Hidalgo for evaluation as this was an unprovoked DVT.  Dr. Myna Hidalgo did a hematologic work up, and because this was negative, ordered a CT scan on the patient looking for occult malignancy as the cause of the DVT.  The CT scan of his chest/abd/pelvis was negative for malignancy, but was positive for acute B pulmonary emboli (despite the fact that he had and continues to have no cardiopulmonary symptoms).  The patient was sent to the ED, and Dr. Myna Hidalgo requests that the patient be taken off of Xarelto and put instead on coumadin and admitted to the hospital.  Review of Systems: 12 systems reviewed and otherwise negative.  Past Medical History  Diagnosis Date  . Hypertension   . Depression   . Neuromuscular disorder   . Back pain   . Low back pain potentially associated with spinal stenosis   . Neuropathy of lower extremity     bilateral   Past Surgical History  Procedure Laterality Date  . Eye surgery    . Hernia repair     Social History:  reports that he has quit smoking. He has never used smokeless tobacco. He reports that  drinks alcohol. He reports that he does not use illicit drugs.  Allergies  Allergen Reactions  . Codeine Sulfate     No family history on file. No history of clotting disorder.  Prior to Admission medications   Medication Sig Start Date End Date Taking? Authorizing Provider  atenolol (TENORMIN) 25 MG tablet Take 12.5 mg by mouth daily.   Yes Historical Provider, MD  hydrochlorothiazide (HYDRODIURIL) 25 MG tablet Take 25 mg by mouth daily.    Yes Historical Provider, MD  ketoconazole (NIZORAL) 2 % shampoo Apply topically daily.   Yes Historical Provider, MD  Menthol-Methyl Salicylate (THERA-GESIC PLUS) CREA Apply topically.   Yes Historical Provider, MD  aspirin EC 81 MG tablet Take 81 mg by mouth every evening.    Historical Provider, MD  buPROPion (WELLBUTRIN SR) 150 MG 12 hr tablet Take 150 mg by mouth 2 (two) times daily.    Historical Provider, MD  finasteride (PROSCAR) 5 MG tablet Take 5 mg by mouth at bedtime.    Historical Provider, MD  gabapentin (NEURONTIN) 300 MG capsule Take 300 mg by mouth daily.    Historical Provider, MD  Multiple Vitamins-Minerals (MULTIVITAMINS THER. W/MINERALS) TABS Take 1 tablet by mouth daily.    Historical Provider, MD  Rivaroxaban (XARELTO) 20 MG TABS tablet Take 20 mg by mouth daily. 02/12/13   Thao P Le, DO  traMADol (ULTRAM) 50 MG tablet Take 100 mg by mouth 3 (three) times daily. Takes 2 tabs    Historical Provider, MD  zolpidem (AMBIEN) 10 MG tablet Take 10 mg by mouth at bedtime as needed. For sleep    Historical Provider, MD   Physical Exam: Filed Vitals:   02/17/13 2225  BP: 138/98  Pulse: 81  Temp: 98.5 F (36.9 C)  Resp: 18    General:  NAD, resting comfortably in bed Eyes: PEERLA EOMI ENT: mucous membranes moist  Neck: supple w/o JVD Cardiovascular: RRR w/o MRG Respiratory: CTA B Abdomen: soft, nt, nd, bs+ Skin: LLE swelling and erythema noted on exam Musculoskeletal: MAE, full ROM all 4 extremities Psychiatric: normal tone and affect Neurologic: AAOx3, grossly non-focal  Labs on Admission:  Basic Metabolic Panel:  Recent Labs Lab 02/12/13 1424 02/17/13 1846  NA 138 137  K 3.7 3.8  CL 95* 93*  CO2 34* 32  GLUCOSE 88 96  BUN 23 17  CREATININE 1.24 1.20  CALCIUM 9.6 9.9   Liver Function Tests:  Recent Labs Lab 02/12/13 1424  AST 15  ALT 18  ALKPHOS 91  BILITOT 0.8  PROT 6.7  ALBUMIN 4.3   No results found for this basename: LIPASE, AMYLASE,  in the  last 168 hours No results found for this basename: AMMONIA,  in the last 168 hours CBC:  Recent Labs Lab 02/12/13 1424 02/17/13 1846  WBC 10.5* 11.4*  NEUTROABS  --  8.5*  HGB 16.5 15.8  HCT 50.6 47.0  MCV 90.9 85.6  PLT  --  207   Cardiac Enzymes: No results found for this basename: CKTOTAL, CKMB, CKMBINDEX, TROPONINI,  in the last 168 hours  BNP (last 3 results) No results found for this basename: PROBNP,  in the last 8760 hours CBG: No results found for this basename: GLUCAP,  in the last 168 hours  Radiological Exams on Admission: Ct Chest W Contrast  02/17/2013   *RADIOLOGY REPORT*  Clinical Data:  Cough.  New deep venous thrombosis identified in the left leg.  Abdominal pain.  CT CHEST, ABDOMEN AND PELVIS WITH CONTRAST  Technique:  Multidetector CT imaging of the chest, abdomen and pelvis was performed following the standard protocol during bolus administration of intravenous contrast.  Contrast: OMNIPAQUE IOHEXOL 300 MG/ML  SOLN,  Comparison:  No priors.  CT CHEST  Findings:  Mediastinum: There are multiple filling defects within the pulmonary arterial tree in the lungs bilaterally (right greater than left), compatible with acute pulmonary embolus.  The largest burden of clot is in the distal right main pulmonary artery extending predominately into lobar, segmental and subsegmental sized branches to the right lower lobe.  Several of these appear completely occluded.  There is also predominately nonocclusive embolus to the distal left main pulmonary artery extending into the left lower lobe segmental and subsegmental sized branches.  Heart size is normal. There is no significant pericardial fluid, thickening or pericardial calcification. No pathologically enlarged mediastinal or hilar lymph nodes. Esophagus is unremarkable in appearance.  Lungs/Pleura: Minimal subsegmental atelectasis or scarring in the medial segment of the right middle lobe and inferior segment of the lingula.   No acute consolidative airspace disease to suggest hemorrhage from pulmonary infarction, or underlying infection.  No suspicious appearing pulmonary nodule or mass.  No pneumothorax.  Musculoskeletal: There are no aggressive appearing lytic or blastic lesions noted in the visualized portions of the skeleton.  IMPRESSION:  1.  Study is positive for acute pulmonary embolus in the lungs bilaterally, as detailed above.  No current findings to suggest pulmonary infarction are identified at this time.  CT ABDOMEN AND PELVIS  Findings:  Abdomen/Pelvis: Low attenuation in the hepatic parenchyma, suggestive of hepatic steatosis (this is difficult to confirm on a contrast enhanced CT scan).  No focal hepatic lesions.  The appearance of the gallbladder, pancreas, spleen and bilateral adrenal glands is unremarkable.  There are several low attenuation lesions in the kidneys bilaterally, largest of which measures 3.8 cm in  diameter extending exophytically off the lower pole of the left kidney, compatible with multiple simple cysts.  The deep venous system of the abdomen, pelvis and visualized upper thighs appears widely patent without definite filling defect to suggest deep venous thrombosis in these regions at this time.  No significant volume of ascites.  No pneumoperitoneum.  No pathologic distension of small bowel.  No definite pathologic lymphadenopathy identified within the abdomen or pelvis.  Normal appendix.  Mild atherosclerosis throughout the abdominal and pelvic vasculature, without evidence of aneurysm or dissection.  Prostate gland and urinary bladder are unremarkable in appearance.  Musculoskeletal: There are no aggressive appearing lytic or blastic lesions noted in the visualized portions of the skeleton.  IMPRESSION:  1.  No acute findings in the abdomen or pelvis. 2.  Probable hepatic steatosis. 3.  Multiple simple cysts in the kidneys bilaterally.  These results were called by telephone on 02/17/2013 at 05:20  p.m. to Dr. Myna Hidalgo, who verbally acknowledged these results.   Original Report Authenticated By: Trudie Reed, M.D.   Ct Abdomen Pelvis W Contrast  02/17/2013   *RADIOLOGY REPORT*  Clinical Data:  Cough.  New deep venous thrombosis identified in the left leg.  Abdominal pain.  CT CHEST, ABDOMEN AND PELVIS WITH CONTRAST  Technique:  Multidetector CT imaging of the chest, abdomen and pelvis was performed following the standard protocol during bolus administration of intravenous contrast.  Contrast: OMNIPAQUE IOHEXOL 300 MG/ML  SOLN,  Comparison:  No priors.  CT CHEST  Findings:  Mediastinum: There are multiple filling defects within the pulmonary arterial tree in the lungs bilaterally (right greater than left), compatible with acute pulmonary embolus.  The largest burden of clot is in the distal right main pulmonary artery extending predominately into lobar, segmental and subsegmental sized branches to the right lower lobe.  Several of these appear completely occluded.  There is also predominately nonocclusive embolus to the distal left main pulmonary artery extending into the left lower lobe segmental and subsegmental sized branches.  Heart size is normal. There is no significant pericardial fluid, thickening or pericardial calcification. No pathologically enlarged mediastinal or hilar lymph nodes. Esophagus is unremarkable in appearance.  Lungs/Pleura: Minimal subsegmental atelectasis or scarring in the medial segment of the right middle lobe and inferior segment of the lingula.  No acute consolidative airspace disease to suggest hemorrhage from pulmonary infarction, or underlying infection.  No suspicious appearing pulmonary nodule or mass.  No pneumothorax.  Musculoskeletal: There are no aggressive appearing lytic or blastic lesions noted in the visualized portions of the skeleton.  IMPRESSION:  1.  Study is positive for acute pulmonary embolus in the lungs bilaterally, as detailed above.  No current  findings to suggest pulmonary infarction are identified at this time.  CT ABDOMEN AND PELVIS  Findings:  Abdomen/Pelvis: Low attenuation in the hepatic parenchyma, suggestive of hepatic steatosis (this is difficult to confirm on a contrast enhanced CT scan).  No focal hepatic lesions.  The appearance of the gallbladder, pancreas, spleen and bilateral adrenal glands is unremarkable.  There are several low attenuation lesions in the kidneys bilaterally, largest of which measures 3.8 cm in diameter extending exophytically off the lower pole of the left kidney, compatible with multiple simple cysts.  The deep venous system of the abdomen, pelvis and visualized upper thighs appears widely patent without definite filling defect to suggest deep venous thrombosis in these regions at this time.  No significant volume of ascites.  No pneumoperitoneum.  No pathologic distension  of small bowel.  No definite pathologic lymphadenopathy identified within the abdomen or pelvis.  Normal appendix.  Mild atherosclerosis throughout the abdominal and pelvic vasculature, without evidence of aneurysm or dissection.  Prostate gland and urinary bladder are unremarkable in appearance.  Musculoskeletal: There are no aggressive appearing lytic or blastic lesions noted in the visualized portions of the skeleton.  IMPRESSION:  1.  No acute findings in the abdomen or pelvis. 2.  Probable hepatic steatosis. 3.  Multiple simple cysts in the kidneys bilaterally.  These results were called by telephone on 02/17/2013 at 05:20 p.m. to Dr. Myna Hidalgo, who verbally acknowledged these results.   Original Report Authenticated By: Trudie Reed, M.D.    EKG: Independently reviewed.  Assessment/Plan Principal Problem:   Acute pulmonary embolism Active Problems:   Left leg DVT   1. Acute PE from LLE DVT - patient on heparin gtt, admitted at request of Dr. Myna Hidalgo, will need to call him in the morning to see what further testing he wants ordered.   Sounds like the plan per patient was to observe and make sure that blood clots were not becoming more extensive in the next few days.  Have patient on tele monitor.    Code Status: Full Code (must indicate code status--if unknown or must be presumed, indicate so) Family Communication: Spoke with family at bedside (indicate person spoken with, if applicable, with phone number if by telephone) Disposition Plan: Admit to obs (indicate anticipated LOS)  Time spent: 50 min  Sharyl Panchal M. [facility] Pager [phone]  If 7PM-7AM, please contact night-coverage [email or web address] Password [email or web address] 02/18/2013, 1:01 AM

## 2013-02-18 NOTE — Consult Note (Signed)
#   [id number] is consultation note.  I very much believe that he would benefit from heparin over the weekend. I would then put him on Arixtra. He really 10 mg a day subcutaneous. Keep him on this for good 3 weeks and then convert over to Coumadin.  I does feel that he has an aggressive thromboembolic events. The fact that he was asymptomatic is troublesome.  I really believe that the heparin over the next 3 days will be quite beneficial for causing regression of his pulmonary emboli. 3 weeks of LMWH would then further improve his clot burden.  The problem that we have is that I don't know if these emboli developed while on Xarelto or if he had these initially.  As much is I prefer not to use Coumadin, this still does have a role in thromboembolic therapy.  I very much appreciate the great care that he has received so far.  We will follow along.  [name]

## 2013-02-18 NOTE — Progress Notes (Signed)
Utilization Review completed.  

## 2013-02-18 NOTE — Progress Notes (Signed)
ANTICOAGULATION CONSULT NOTE - Follow Up Consult  Pharmacy Consult for Heparin Indication: pulmonary embolus  Allergies  Allergen Reactions  . Codeine Sulfate Itching and Nausea Only    Patient Measurements: Height: 5\' 6"  (167.6 cm) Weight: 281 lb 1.4 oz (127.5 kg) IBW/kg (Calculated) : 63.8 Heparin Dosing Weight: 101kg  Vital Signs: Temp: 98.7 F (37.1 C) (09/06 1423) Temp src: Oral (09/06 1423) BP: 116/73 mmHg (09/06 1423) Pulse Rate: 79 (09/06 1423)  Labs:  Recent Labs  02/17/13 1846 02/18/13 0540 02/18/13 0810 02/18/13 1730  HGB 15.8 15.4  --   --   HCT 47.0 44.3  --   --   PLT 207 213  --   --   APTT 34  --   --   --   LABPROT 16.0*  --   --   --   INR 1.31  --   --   --   HEPARINUNFRC  --   --  0.95* 0.45  CREATININE 1.20 1.12  --   --     Estimated Creatinine Clearance: 79.7 ml/min (by C-G formula based on Cr of 1.12).   Medications:  Heparin @ 1500 units/hr  Assessment: 67yom continues on heparin for PE (9/5). Heparin level is now therapeutic after rate decrease this morning. No bleeding reported.  Goal of Therapy:  Heparin level 0.3-0.7 units/ml Monitor platelets by anticoagulation protocol: Yes   Plan:  1) Continue heparin at 1500 units/hr 2) 6 hour heparin level to confirm  Fredrik Rigger 02/18/2013,6:16 PM

## 2013-02-19 LAB — CBC
HCT: 41.6 % (ref 39.0–52.0)
Hemoglobin: 14.2 g/dL (ref 13.0–17.0)
MCH: 29.1 pg (ref 26.0–34.0)
MCHC: 34.1 g/dL (ref 30.0–36.0)
MCV: 85.2 fL (ref 78.0–100.0)
RDW: 14.9 % (ref 11.5–15.5)

## 2013-02-19 LAB — HEPARIN LEVEL (UNFRACTIONATED): Heparin Unfractionated: 0.35 IU/mL (ref 0.30–0.70)

## 2013-02-19 MED ORDER — FONDAPARINUX SODIUM 10 MG/0.8ML ~~LOC~~ SOLN
10.0000 mg | Freq: Every day | SUBCUTANEOUS | Status: DC
  Administered 2013-02-20 – 2013-02-21 (×2): 10 mg via SUBCUTANEOUS
  Filled 2013-02-19 (×3): qty 0.8

## 2013-02-19 MED ORDER — FONDAPARINUX (ARIXTRA) PATIENT EDUCATION KIT
PACK | Freq: Once | Status: DC
  Filled 2013-02-19: qty 1

## 2013-02-19 NOTE — Progress Notes (Signed)
Mr. Advani is doing okay. He is on a heparin drip. We will continue this today. There is no bleeding. There is no cough or shortness of breath. No chest wall pain. Left leg may be a little bit less swollen. No venous cord is noted in the left leg. He is ambulating.  His vital signs are all stable. Blood pressure 133/72. No change in his physical exam.  Again, we will maintain his heparin drip today. Tomorrow, I would start Arixtra at 10 mg a day. Hopefully, his insurance company will pay for this. If not, then he will need twice-daily Lovenox for 21 days.  I will start Coumadin as an outpatient in about 3 weeks.  He needs a compression stocking for his left leg. He is at significant risk for post phlebitic syndrome.  I appreciate great care that he is getting on [address]!!  [name]

## 2013-02-19 NOTE — Progress Notes (Signed)
ANTICOAGULATION CONSULT NOTE - Follow Up Consult  Pharmacy Consult for Heparin Indication: pulmonary embolus  Allergies  Allergen Reactions  . Codeine Sulfate Itching and Nausea Only    Patient Measurements: Height: 5\' 6"  (167.6 cm) Weight: 281 lb 1.4 oz (127.5 kg) IBW/kg (Calculated) : 63.8 Heparin Dosing Weight: 101kg  Vital Signs: Temp: 98.2 F (36.8 C) (09/07 0425) Temp src: Oral (09/07 0425) BP: 133/72 mmHg (09/07 0425) Pulse Rate: 74 (09/07 0425)  Labs:  Recent Labs  02/17/13 1846 02/18/13 0540 02/18/13 0810 02/18/13 1730 02/19/13 0415  HGB 15.8 15.4  --   --  14.2  HCT 47.0 44.3  --   --  41.6  PLT 207 213  --   --  193  APTT 34  --   --   --   --   LABPROT 16.0*  --   --   --   --   INR 1.31  --   --   --   --   HEPARINUNFRC  --   --  0.95* 0.45 0.35  CREATININE 1.20 1.12  --   --   --     Estimated Creatinine Clearance: 79.7 ml/min (by C-G formula based on Cr of 1.12).   Medications:  Heparin @ 1500 units/hr  Assessment: 67yom continues on heparin for PE (9/5). Heparin level is therapeutic. CBC is stable. No bleeding reported. Noted plan by Dr. Myna Hidalgo to switch to arixtra or lovenox tomorrow.  Goal of Therapy:  Heparin level 0.3-0.7 units/ml Monitor platelets by anticoagulation protocol: Yes   Plan:  1) Continue heparin at 1500 units/hr 2) Heparin level, CBC in AM 3) Follow up further anticoagulation plans  Fredrik Rigger 02/19/2013,10:54 AM

## 2013-02-19 NOTE — Progress Notes (Signed)
Subjective: Appreciate Dr. Gustavo Lah help - no changes from his outlined plan.  Tanner [name] is up ambulating. Has no complaints. Understands the plan as laid out by Dr. Myna Hidalgo  Objective: Lab:  Recent Labs  02/17/13 1846 02/18/13 0540 02/19/13 0415  WBC 11.4* 10.9* 7.6  NEUTROABS 8.5*  --   --   HGB 15.8 15.4 14.2  HCT 47.0 44.3 41.6  MCV 85.6 85.2 85.2  PLT 207 213 193    Recent Labs  02/17/13 1846 02/18/13 0540  NA 137 136  K 3.8 3.2*  CL 93* 96  GLUCOSE 96 115*  BUN 17 16  CREATININE 1.20 1.12  CALCIUM 9.9 9.5    Imaging:  Scheduled Meds: . buPROPion  150 mg Oral BID  . finasteride  5 mg Oral QHS  . gabapentin  400 mg Oral QHS  . sodium chloride  3 mL Intravenous Q12H  . traMADol  100 mg Oral TID  . zolpidem  5 mg Oral QHS   Continuous Infusions: . heparin 1,500 Units/hr (02/19/13 0304)   PRN Meds:.ondansetron (ZOFRAN) IV   Physical Exam: Filed Vitals:   02/19/13 0425  BP: 133/72  Pulse: 74  Temp: 98.2 F (36.8 C)  Resp: 16   Gen'l obese white male in no distress Up ambulating - no further exam      Assessment/Plan: 1. Coagulopathy - on heparin.  Plan Start arixtra tomorrow - nursing to teach self-injection    Tanner [name] [place] [facility] (o) [phone]; (c) [phone] Call-grp - Tanner [name] [facility]  Tele: [phone]  02/19/2013, 11:44 AM

## 2013-02-20 ENCOUNTER — Other Ambulatory Visit: Payer: Self-pay | Admitting: Hematology & Oncology

## 2013-02-20 DIAGNOSIS — I82402 Acute embolism and thrombosis of unspecified deep veins of left lower extremity: Secondary | ICD-10-CM

## 2013-02-20 DIAGNOSIS — I2699 Other pulmonary embolism without acute cor pulmonale: Secondary | ICD-10-CM

## 2013-02-20 LAB — CBC
MCH: 29.2 pg (ref 26.0–34.0)
MCHC: 34.2 g/dL (ref 30.0–36.0)
MCV: 85.4 fL (ref 78.0–100.0)
Platelets: 195 10*3/uL (ref 150–400)
RBC: 4.72 MIL/uL (ref 4.22–5.81)

## 2013-02-20 NOTE — Progress Notes (Signed)
Provided discharge education for arixtra injections to pt and pt's wife. Both verbalized understanding of how to give injections and rotating spots. Pt was given lovenox starter kit since we do not have arixtra kit. Pt is well educated and ready for discharge with wife.

## 2013-02-20 NOTE — Progress Notes (Signed)
Tanner [name] had a good day yesterday. He now is off heparin and on Arixtra. He has a compression stocking on his left leg. There is still some swelling in the left leg. There is no chest pain. He has no dyspnea. There is no hemoptysis.  His lab work looks good today. Platelet count 195. Hemoglobin is 13.8.   His vital signs are stable. Oxygen saturation on room air is 93%. Blood pressure 119/58. He is afebrile. Lungs are clear. Cardiac exam regular rate and rhythm with no murmurs rubs or bruits. Abdomen is obese. Extremities shows a mild nonpitting edema of the left leg. Right leg is unremarkable.  I suspect he will be discharged today. I will follow him up as an outpatient next week.  My plan is to keep him on Arixtra for 3 weeks. I will then transitioned over to Coumadin. I still suspect that the pulmonary emboli were present initially. However, I still cannot rule out the possibility that they developed while he was on Xarelto.  I very much appreciate Dr. Debby Bud' outstanding care and also the care from the staff on [address].  [name]

## 2013-02-20 NOTE — Progress Notes (Addendum)
Subjective: Holding note - if he does well with self injection will d/c home this PM  Objective: Lab:  Recent Labs  02/17/13 1846 02/18/13 0540 02/19/13 0415 02/20/13 0502  WBC 11.4* 10.9* 7.6 7.7  NEUTROABS 8.5*  --   --   --   HGB 15.8 15.4 14.2 13.8  HCT 47.0 44.3 41.6 40.3  MCV 85.6 85.2 85.2 85.4  PLT 207 213 193 195    Recent Labs  02/17/13 1846 02/18/13 0540  NA 137 136  K 3.8 3.2*  CL 93* 96  GLUCOSE 96 115*  BUN 17 16  CREATININE 1.20 1.12  CALCIUM 9.9 9.5    Imaging:  Scheduled Meds: . buPROPion  150 mg Oral BID  . finasteride  5 mg Oral QHS  . fondaparinux (ARIXTRA) injection  10 mg Subcutaneous Q0600  . fondaparinux   Does not apply Once  . gabapentin  400 mg Oral QHS  . sodium chloride  3 mL Intravenous Q12H  . traMADol  100 mg Oral TID  . zolpidem  5 mg Oral QHS   Continuous Infusions:  PRN Meds:.ondansetron (ZOFRAN) IV   Physical Exam: Filed Vitals:   02/20/13 0428  BP: 119/68  Pulse: 77  Temp: 98.4 F (36.9 C)  Resp: 16  gen'l obese white man in no distress Cor- RRR PUlm - normal respirations      Assessment/Plan: Appears stable. For d/c home this PM if all goes well today.   addendeum - Arixtra not approved at this time Plan Will ask to have medical director BC/BS call me at my office if there is a problem with approval after all the submitted information is reviewed.  The alternative per Dr. Myna Hidalgo would be lovenox full dose twice a day at an approximate cost of $90 per syringe/injection  Plan to discharge 9/9 on either Arixtra or Lovenox.   Illene Regulus [place] [facility] (o) [phone]; (c) [phone] Call-grp - [facility]  Tele: [phone]  02/20/2013, 6:34 AM

## 2013-02-20 NOTE — Care Management Note (Signed)
    Page 1 of 2   02/21/2013     2:46:52 PM   CARE MANAGEMENT NOTE 02/21/2013  Patient:  Tanner [name], Tanner [name]   Account Number:  [id number]  Date Initiated:  02/18/2013  Documentation initiated by:  Lanier Clam  Subjective/Objective Assessment:   66 y/o m admitted w/l leg dvt.     Action/Plan:   From home.   Anticipated DC Date:  02/21/2013   Anticipated DC Plan:  HOME/SELF CARE      DC Planning Services  CM consult  Medication Assistance      Choice offered to / List presented to:             Status of service:  Completed, signed off Medicare Important Message given?   (If response is "NO", the following Medicare IM given date fields will be blank) Date Medicare IM given:   Date Additional Medicare IM given:    Discharge Disposition:  HOME/SELF CARE  Per UR Regulation:  Reviewed for med. necessity/level of care/duration of stay  If discussed at Long Length of Stay Meetings, dates discussed:    Comments:  02/21/13 Avantika Shere,RN,BSN [phone] [employer] WILL APPROVE GENERIC FONDAPARINUX, BUT NOT BRAND NAME ARIXTRA.  PER [facility] ON [address], COPAY WILL BE $168.51 FOR RX.  PT STATES HE CAN AFFORD THIS.  MED BEING ORDERED TODAY, AND WILL BE AVAILABLE AT DRUG STORE TOMORROW.  EXPLAINED ALL THIS TO PT/WIFE.  PT GIVEN NEEDY MEDS RX DISCOUNT CARD FOR POSSIBLE COPAY DISCOUNT.  02/20/13 Osvaldo Lamping,RN,BSN [phone] FOLLOW UP CALL TO [employer] TO CHECK STATUS OF NEEDED PRE AUTHORIZATION.  REP STATED THAT THE NURSE THAT HAS THE CASE HAS LEFT FOR THE DAY, AND WE WILL NOT HAVE AN ANSWER UNTIL TOMORROW AM.  NOTIFIED BEDSIDE RN, HOPE OF THIS.  SHE WILL RELAY MESSAGE TO PT/WIFE.  WILL FOLLOW UP IN AM.   02/20/13 Daylani Deblois,RN,BSN [phone] PT TO DC ON ARIXTRA 10MG  SQ X 3 WEEKS.  SUBMITTED BENEFITS CHECK FOR THIS MED; MED NEEDS PREAUTHORIZATION AND IS NOT ON BLUE MEDICARE'S FORMULARY.  RECEIVED RX PRE AUTH FORM AND COMPLETED; DR ENNEVER COMPLETED, SIGNED AND FAXED BACK TO  ME.  ALL CLINICALS AND PREAUTH DOCUMENTATION FAXED TO [employer] HMO/PPO COVERAGE DETERMINATIONS AT [phone]. AWAIT WORD FROM INSURANCE CO. ON PREAUTH APPROVAL.  PT INFORMED OF THIS SITUATION, AND THAT HE MAY NOT BE DC'D TODAY IF WE DON'T HEAR FROM INSURANCE CO TODAY.  COST OF EACH SYRINGE IS $117, PER PT'S PHARMACY, [facility] ON [address].  PHARMACY WILL NEED TO ORDER ARIXTRA AS THEY DO NOT HAVE IN STOCK.

## 2013-02-21 ENCOUNTER — Telehealth: Payer: Self-pay | Admitting: Hematology & Oncology

## 2013-02-21 LAB — CBC
HCT: 39.9 % (ref 39.0–52.0)
MCHC: 34.3 g/dL (ref 30.0–36.0)
Platelets: 189 10*3/uL (ref 150–400)
RDW: 14.9 % (ref 11.5–15.5)
WBC: 8.1 10*3/uL (ref 4.0–10.5)

## 2013-02-21 MED ORDER — FONDAPARINUX SODIUM 10 MG/0.8ML ~~LOC~~ SOLN: 10.0000 mg | Freq: Every day | SUBCUTANEOUS | Status: DC

## 2013-02-21 MED ORDER — ACETAMINOPHEN 500 MG PO TABS: 1000.0000 mg | ORAL_TABLET | Freq: Three times a day (TID) | ORAL | Status: DC | PRN

## 2013-02-21 NOTE — Telephone Encounter (Signed)
Patient called and sch 03/13/13 follow up apt from hospital.

## 2013-02-21 NOTE — Progress Notes (Addendum)
Discharge instructions and new prescriptions given to pt and pt's wife. Pt has been well educated on new injections- Arixtra. Pt feels comfortable with injections. Pt is stable for discharge with wife.

## 2013-02-22 NOTE — Discharge Summary (Signed)
NAMEMarland Kitchen  RONDARIUS, KADRMAS NO.:  [id number]  MEDICAL RECORD NO.:  [id number]  LOCATION:  [id number]                        FACILITY:  [facility]  PHYSICIAN:  Rosalyn Gess. Kynzie Polgar, MD  DATE OF BIRTH:  September 25, 1945  DATE OF ADMISSION:  02/17/2013 DATE OF DISCHARGE:  02/21/2013                              DISCHARGE SUMMARY   ADMITTING DIAGNOSES:  Deep vein thrombosis with pulmonary embolus, having failed Xarelto.  DISCHARGE DIAGNOSES:  Deep vein thrombosis with pulmonary embolus, having failed Xarelto.  CONSULTANTS:  Josph Macho, M.D., for Hematology.  PROCEDURES: 1. CT abdomen and pelvis, February 17, 2013.  There are no acute     findings in the abdomen or pelvis.  Probable hepatic steatosis.     Multiple simple cysts in the kidneys bilaterally. 2. CT of the chest, study is positive for acute pulmonary embolus in     the lungs bilaterally with multiple defects in the arterial tree,     right greater than left, compatible with acute PE.  Largest burden     of clot is in the distal right main pulmonary artery, extending     predominantly into lobar segmental and subsegmental __________     branches to the right lower lobe.  Several of these appeared     completely occluded.  There is also predominantly nonocclusive     embolus in the distal left main pulmonary artery, extending into     the left lower lobe segmental and subsegmental __________ branches.  HISTORY OF PRESENT ILLNESS:  Mr. Grainger is a 67 year old gentleman who has been getting his medical care at the [facility].  He developed swelling of his left leg on February 12, 2013, and was seen at [facility], was diagnosed with an occlusive left lower extremity DVT that was fairly extensive.  He was started on Xarelto and referred to Dr. Myna Hidalgo for evaluation for an unprovoked DVT.  Dr. Myna Hidalgo did a hematologic workup and did order a CT scan of the patient, which revealed the patient to have multiple  PEs as described above. Because of his failure of Xarelto as an outpatient, he was referred to the emergency department for evaluation and admission to be started on IV heparin.  Please see the [facility] for past medical history, family history, social history, and admitting examination.  HOSPITAL COURSE:  Hematology.  The patient with multiple PEs of unknown source.  Dr. Myna Hidalgo has been working the patient up as an outpatient. No data is available other than his note which indicated that there was no coag factor deficiency.  Dr. Gustavo Lah plan was for the patient to receive IV heparin for the 3-day weekend and then to be converted over to Arixtra 10 mg daily for 3 weeks following that to be converted to Coumadin.  The patient did well during his hospitalizations with no shortness of breath, chest pain, or other significant symptoms.  The patient was scheduled for discharge, but there was a delay because [employer] refused to provide authorization for Arixtra.  At the time of this dictation, approval from [facility] is pending for Arixtra versus Lovenox, with  Arixtra being the less expensive alternative.  Once the patient has been given prior authorization for a needed anticoagulant medication, he will be discharged home.  He has followup with Dr. Myna Hidalgo in short order.  DISCHARGE PHYSICAL EXAMINATION:  VITAL SIGNS:  Temperature was 99.1, blood pressure 135/78, heart rate was 82, respirations 18, and oxygen saturations 94% on room air. GENERAL APPEARANCE:  This is an obese Caucasian gentleman, in no acute distress. HEENT:  Unremarkable.  Conjunctivae and sclerae being clear. NECK:  Supple, obese.  No thyromegaly was noted. PULMONARY:  The patient is moving air well.  He has no increased work of breathing.  He has no rales, wheezes, or rhonchi. CARDIOVASCULAR:  2+ peripheral pulses.  He had a quiet precordium.  He had a regular rate and rhythm  without murmurs, rubs, or gallops. ABDOMEN:  Morbidly obese.  Positive bowel sounds were noted. GENITALIA:  Deferred. EXTREMITIES:  The patient has no deformity.  He is wearing TED hose on his left leg for significant swelling and to prevent thrombophlebitis.  FINAL LABORATORY DATA:  CBC from February 21, 2013, with a white count of 8100, hemoglobin 13.7 g, platelet count 189,000.  Final heparin level 0.35 on February 19, 2013.  Chemistries from February 18, 2013, with a sodium of 136, potassium 3.2, chloride 96, CO2 of 27, BUN 16, creatinine 1.12, glucose was 115.  DISCHARGE MEDICATIONS:  Tylenol 500 mg 2 tablets q.8 p.r.n., aspirin 81 mg daily, Tenormin 25 mg tablet 1/2 tablet daily, Wellbutrin SR 150 mg b.i.d., Proscar 5 mg daily, Arixtra 10 mg subcutaneously daily, gabapentin 300 mg daily, hydrochlorothiazide 25 mg daily, Nizoral shampoo applied p.r.n. for dandruff, menthol methyl salicylate cream apply topically as needed, multivitamins daily, Cortisporin 1% solution otic 3 drops in each ear 3 times daily as needed, tramadol 50 mg t.i.d. p.r.n., zolpidem 10 mg q.h.s., which will be adjusted downward at office followup.  DISPOSITION:  The patient to be discharged home.  He is to see Dr. Myna Hidalgo ASAP, he needs to call the office for an appointment.  He will be followed by Dr. Debby Bud in 10 days.  Office will call with appointment.  Note:  If [employer] will not approve Arixtra, we will attempt to get Lovenox approved.  If Lovenox is not approved, the patient will need to remain in hospital for anticoagulant therapy.  I have requested that these medications were denied that medical director for [facility] directly call Dr. Debby Bud to try to resolve this issue.  [name] condition at the time of discharge dictation is medically stable.     Rosalyn Gess Celines Femia, MD     MEN/MEDQ  D:  02/21/2013  T:  02/21/2013  Job:  [id number]

## 2013-02-23 ENCOUNTER — Telehealth: Payer: Self-pay | Admitting: General Practice

## 2013-02-23 NOTE — Telephone Encounter (Signed)
Attempted transitional care call.  Left message.

## 2013-02-23 NOTE — Telephone Encounter (Signed)
Transitional care call:  Hospital discharge on 02/21/2013 - Hospital dc diagnosis:  DVT with PE having failed Xarelto.  Patient states that he is doing well.  Patient denies questions regarding hospital discharge instructions.  RN reviewed medications with patient and patient states that all medicines are in the home.  Patient lives with his wife.  Patient denies questions for Dr. Debby Bud at the present time.  Follow up appointment with Dr. Debby Bud on 9/17.

## 2013-03-01 ENCOUNTER — Encounter: Payer: Self-pay | Admitting: Internal Medicine

## 2013-03-01 ENCOUNTER — Ambulatory Visit (INDEPENDENT_AMBULATORY_CARE_PROVIDER_SITE_OTHER): Payer: Medicare Other | Admitting: Internal Medicine

## 2013-03-01 ENCOUNTER — Other Ambulatory Visit (INDEPENDENT_AMBULATORY_CARE_PROVIDER_SITE_OTHER): Payer: Medicare Other

## 2013-03-01 VITALS — BP 104/62 | HR 74 | Temp 98.6°F | Wt 285.6 lb

## 2013-03-01 DIAGNOSIS — R7309 Other abnormal glucose: Secondary | ICD-10-CM

## 2013-03-01 DIAGNOSIS — E876 Hypokalemia: Secondary | ICD-10-CM

## 2013-03-01 DIAGNOSIS — I2699 Other pulmonary embolism without acute cor pulmonale: Secondary | ICD-10-CM

## 2013-03-01 DIAGNOSIS — R739 Hyperglycemia, unspecified: Secondary | ICD-10-CM

## 2013-03-01 LAB — COMPREHENSIVE METABOLIC PANEL
Albumin: 3.9 g/dL (ref 3.5–5.2)
BUN: 19 mg/dL (ref 6–23)
CO2: 31 mEq/L (ref 19–32)
Calcium: 9.1 mg/dL (ref 8.4–10.5)
Chloride: 99 mEq/L (ref 96–112)
Creatinine, Ser: 1.5 mg/dL (ref 0.4–1.5)
GFR: 47.94 mL/min — ABNORMAL LOW (ref >60.00)
Glucose, Bld: 85 mg/dL (ref 70–99)

## 2013-03-01 NOTE — Patient Instructions (Addendum)
1. Pulmonary Embolism - you seem stable with good oxygen level today. The plan is to take Arixtra for 21 days- if there is an issue with coverage for the full period of time start working with [employer] now. We should both contact Dr. Myna Hidalgo about the starting date for coumadin. There will need to be an overlap. We can do the monitoring here.   2. Low potassium - mild issue at discharge. Also you had mildly elevated glucose - will check an A1C, a 90 picture of blood sugar.

## 2013-03-02 ENCOUNTER — Telehealth: Payer: Self-pay | Admitting: *Deleted

## 2013-03-02 NOTE — Telephone Encounter (Signed)
Ok for the additional 6 syringes

## 2013-03-02 NOTE — Telephone Encounter (Signed)
Spoke with the pharmacy advised of MDs order

## 2013-03-02 NOTE — Telephone Encounter (Signed)
Eryn from [facility] states the current Rx for Arixtra is not enough for the complete 21 day dose.  Will need an additional 4.70mL or 6 syringes to complete the 21 day regiment.  Please advise

## 2013-03-02 NOTE — Telephone Encounter (Signed)
Elease Hashimoto called states the Coumadin is to be started on Monday 9.22.14.

## 2013-03-03 ENCOUNTER — Other Ambulatory Visit: Payer: Self-pay | Admitting: General Practice

## 2013-03-03 ENCOUNTER — Telehealth: Payer: Self-pay | Admitting: General Practice

## 2013-03-03 MED ORDER — WARFARIN SODIUM 5 MG PO TABS: ORAL_TABLET | ORAL | Status: DC

## 2013-03-03 NOTE — Telephone Encounter (Signed)
Spoke with patient and instructed to continue Arixtra and start taking coumadin, 5mg  on Monday 9/22.  Patient will come to coumadin clinic on 9/26.  Patient verbalized understanding.

## 2013-03-03 NOTE — Telephone Encounter (Signed)
This patient will need transition from Arixtra to coumadin for h/o PE. Your help is greatly appreciated.  Thanks

## 2013-03-04 NOTE — Assessment & Plan Note (Signed)
Patient with large pulmonary clot burder but relatively asymptomatic. He is being seen by Dr. Myna Hidalgo for coagulopathy.  Plan Full 21 days of Arixtra and starting 03/06/13 begin a transition to coumadin for life.

## 2013-03-04 NOTE — Progress Notes (Signed)
HPI  Tanner [name] is seen for hospital follow-up after admission for heavy PE burden. He was asymptomatic. There was a question of whether he developed clots on Xeralto and is thus a candidate for long term coumadin therapy. Hospital d/C reviewed. Since being home he has done fine. No shortness of breath beyond his usual, no chest pain. He is tolerating Arixtra w/o adverse affects.  Past Medical History   Diagnosis  Date   .  Hypertension    .  Depression    .  Neuromuscular disorder    .  Back pain    .  Low back pain potentially associated with spinal stenosis    .  Neuropathy of lower extremity      bilateral    Past Surgical History   Procedure  Laterality  Date   .  Eye surgery     .  Hernia repair      Family History   Problem  Relation  Age of Onset   .  Diabetes  Mother    .  Pneumonia  Mother    .  Alzheimer's disease  Mother     History    Social History   .  Marital Status:  Married     Spouse Name:  N/A     Number of Children:  N/A   .  Years of Education:  N/A    Occupational History   .  Not on file.    Social History Main Topics   .  Smoking status:  Former Games developer   .  Smokeless tobacco:  Never Used   .  Alcohol Use:  Yes      Comment: occasional   .  Drug Use:  No   .  Sexual Activity:  Not on file    Other Topics  Concern   .  Not on file    Social History Narrative    [employer]    [employer]-2 years    Married '66    2 sons '68, '72; 3 grandchildren    Sales-petroleum equipment sales    Current Outpatient Prescriptions on File Prior to Visit   Medication  Sig  Dispense  Refill   .  acetaminophen (TYLENOL) 500 MG tablet  Take 2 tablets (1,000 mg total) by mouth every 8 (eight) hours as needed for pain.  30 tablet  0   .  aspirin EC 81 MG tablet  Take 81 mg by mouth every evening.     Marland Kitchen  atenolol (TENORMIN) 25 MG tablet  Take 12.5 mg by mouth daily.     Marland Kitchen  buPROPion (WELLBUTRIN SR) 150 MG 12 hr tablet  Take 150 mg by mouth 2 (two) times daily.     .   finasteride (PROSCAR) 5 MG tablet  Take 5 mg by mouth at bedtime.     .  fondaparinux (ARIXTRA) 10 MG/0.8ML SOLN injection  Inject 0.8 mLs (10 mg total) into the skin daily at 6 (six) AM.  4 mL  2   .  gabapentin (NEURONTIN) 300 MG capsule  Take 300 mg by mouth daily.     .  hydrochlorothiazide (HYDRODIURIL) 25 MG tablet  Take 25 mg by mouth daily.     Marland Kitchen  ketoconazole (NIZORAL) 2 % shampoo  Apply topically daily.     .  Menthol-Methyl Salicylate (THERA-GESIC PLUS) CREA  Apply 1 application topically at bedtime. Apply to lower back     .  Multiple Vitamins-Minerals (MULTIVITAMINS  THER. W/MINERALS) TABS  Take 1 tablet by mouth daily.     .  NEOMYCIN-POLYMYXIN-HYDROCORTISONE (CORTISPORIN) 1 % SOLN otic solution  Place 3 drops into both ears 3 (three) times daily as needed (for fungal infection).     .  traMADol (ULTRAM) 50 MG tablet  Take 100 mg by mouth 3 (three) times daily. Takes 2 tabs     .  zolpidem (AMBIEN) 10 MG tablet  Take 10 mg by mouth at bedtime. For sleep      No current facility-administered medications on file prior to visit.    .  Review of Systems  System review is negative for any constitutional, cardiac, pulmonary, GI or neuro symptoms or complaints other than as described in the HPI.   Objective:   Physical Exam  Filed Vitals:    03/01/13 1433   BP:  104/62   Pulse:  74   Temp:  98.6 F (37 C)    Wt Readings from Last 3 Encounters:   03/01/13  285 lb 9.6 oz (129.547 kg)   02/17/13  281 lb 1.4 oz (127.5 kg)   02/17/13  283 lb (128.368 kg)    Gen'l- obese white man in no distress  Cor- RRR  Pulm - no increased WOB, no rales, no wheezes.  Neuro - A&O x 3

## 2013-03-10 ENCOUNTER — Ambulatory Visit (INDEPENDENT_AMBULATORY_CARE_PROVIDER_SITE_OTHER): Payer: Medicare Other | Admitting: General Practice

## 2013-03-10 DIAGNOSIS — Z7901 Long term (current) use of anticoagulants: Secondary | ICD-10-CM

## 2013-03-10 DIAGNOSIS — I2699 Other pulmonary embolism without acute cor pulmonale: Secondary | ICD-10-CM

## 2013-03-10 LAB — POCT INR: INR: 1.6

## 2013-03-10 NOTE — Patient Instructions (Signed)

## 2013-03-13 ENCOUNTER — Other Ambulatory Visit (HOSPITAL_BASED_OUTPATIENT_CLINIC_OR_DEPARTMENT_OTHER): Payer: Medicare Other | Admitting: Lab

## 2013-03-13 ENCOUNTER — Ambulatory Visit (HOSPITAL_BASED_OUTPATIENT_CLINIC_OR_DEPARTMENT_OTHER): Payer: Medicare Other | Admitting: Hematology & Oncology

## 2013-03-13 VITALS — BP 117/52 | HR 70 | Temp 98.1°F | Resp 18 | Ht 66.0 in | Wt 286.0 lb

## 2013-03-13 DIAGNOSIS — I82402 Acute embolism and thrombosis of unspecified deep veins of left lower extremity: Secondary | ICD-10-CM

## 2013-03-13 DIAGNOSIS — I82409 Acute embolism and thrombosis of unspecified deep veins of unspecified lower extremity: Secondary | ICD-10-CM

## 2013-03-13 DIAGNOSIS — Z7901 Long term (current) use of anticoagulants: Secondary | ICD-10-CM

## 2013-03-13 DIAGNOSIS — I2699 Other pulmonary embolism without acute cor pulmonale: Secondary | ICD-10-CM

## 2013-03-13 LAB — CBC WITH DIFFERENTIAL (CANCER CENTER ONLY)
BASO%: 0.6 % (ref 0.0–2.0)
Eosinophils Absolute: 0.3 10*3/uL (ref 0.0–0.5)
MONO#: 0.7 10*3/uL (ref 0.1–0.9)
NEUT#: 5.1 10*3/uL (ref 1.5–6.5)
Platelets: 166 10*3/uL (ref 145–400)
RBC: 5.25 10*6/uL (ref 4.20–5.70)
RDW: 14.9 % (ref 11.1–15.7)
WBC: 7.7 10*3/uL (ref 4.0–10.0)

## 2013-03-13 LAB — D-DIMER, QUANTITATIVE: D-Dimer, Quant: 0.27 ug/mL-FEU (ref 0.00–0.48)

## 2013-03-13 NOTE — Progress Notes (Signed)
This office note has been dictated.

## 2013-03-14 NOTE — Progress Notes (Signed)
CC:   Rosalyn Gess. Norins, MD  DIAGNOSES: 1. Deep venous thrombosis of the left leg. 2. Pulmonary embolism-bilateral. 3. Possible protein S deficiency.  CURRENT THERAPY:  Coumadin to maintain INR between 2-3--patient will need 1 year of therapy.  INTERIM HISTORY:  Mr. Schrieber comes in for followup.  This is his second office visit.  When I first saw him, I decided to get a CT scan of his chest.  When I saw him, he had already been on Arixtra.  His hypercoagulable studies showed a minimal-depressed protein S level of 65%.  Shockingly, his CT of the chest showed bilateral pulmonary emboli.  I do not know if these developed while on Xarelto or if they were already present.  He was asymptomatic.  Regards, he was admitted.  We went ahead and got him on heparin.  We then got him on Coumadin.  He was on, I think, Arixtra or Lovenox shots for 3 weeks prior to Coumadin.  He feels well.  He has a compression stocking on his left leg.  He has not complained of too much pain in the left leg.  There has been no bleeding.  He has had no change in bowel or bladder habits.  He has had no nausea or vomiting.  There has been no chest pain.  He has had no hemoptysis.  His weight is quite high.  He has had no change in medications.  Dr. Debby Bud is doing a great job of following him.  Mr. Patmon is getting his Coumadin checked at the [facility].  PHYSICAL EXAMINATION:  General:  On his physical exam, this is an obese white gentleman, in no obvious distress.  Vital signs showed temperature of 98.1, pulse 70, respiratory rate 18, blood pressure 117/52.  Weight is 286 pounds.  Head and neck exam shows a  normocephalic, atraumatic skull.  There are no ocular or oral lesions.  He has no adenopathy in the neck.  Thyroid is not palpable.  Lungs are clear bilaterally.  I hear no friction rubs.  There are no rales or wheezes.  He has good air movement bilaterally.  Cardiac Exam:  Regular rate  and rhythm with a normal S1, S2.  There are no murmurs, rubs or bruits.  Abdomen:  Soft. He has good bowel sounds.  He is obese.  There is no fluid wave.  There is no guarding or rebound tenderness.  There is no palpable hepatosplenomegaly.  Extremities:  Show some trace, nonpitting edema of the left leg.  No venous cord is noted in the legs bilaterally.  He has good joint strength.  He has good range of motion.  He has good muscle strength bilaterally.  Neurological exam shows no focal neurological deficits.  Skin Exam:  No rashes, ecchymoses or petechia.  LABORATORY STUDIES:  White cell count is 7.7, hemoglobin 15.1, hematocrit 45.2, platelet count 166.  D-dimer is 0.27.  IMPRESSION:  Mr. Mavis is a nice, 67 year old gentleman.  He has DVT of the left leg and bilateral pulmonary emboli.  Personally, I believe that these were all at the same time.  However, since we are not sure of any type of Xarelto resistance, he is on Coumadin.  I would put him on blood thinner for 1 year.  I think this would be appropriate since he has a pulmonary embolism.  The protein S decrease is unclear as to whether or not this is clinically significant.  I probably would have to recheck this after  he gets done with all of his anticoagulation.  I do want to repeat scans on him.  I want to do CT angio of the chest and Doppler, probably in November.  Hopefully, we will find that there has been resolution of his thromboembolic disease.  I want see him back in 6 weeks.  He will continue to have his INR checked by the [facility].  I spent a good 40 minutes or so with Mr. Cicero and his wife.  I answered all their questions.  I am glad to see that he is feeling better.    ______________________________ Josph Macho, M.D. PRE/MEDQ  D:  03/13/2013  T:  03/14/2013  Job:  [id number]

## 2013-03-17 ENCOUNTER — Ambulatory Visit (INDEPENDENT_AMBULATORY_CARE_PROVIDER_SITE_OTHER): Payer: Medicare Other | Admitting: General Practice

## 2013-03-17 DIAGNOSIS — I2699 Other pulmonary embolism without acute cor pulmonale: Secondary | ICD-10-CM

## 2013-03-17 DIAGNOSIS — Z7901 Long term (current) use of anticoagulants: Secondary | ICD-10-CM

## 2013-03-17 LAB — POCT INR: INR: 3.1

## 2013-03-22 DIAGNOSIS — I2699 Other pulmonary embolism without acute cor pulmonale: Secondary | ICD-10-CM

## 2013-03-22 DIAGNOSIS — R7309 Other abnormal glucose: Secondary | ICD-10-CM

## 2013-03-22 DIAGNOSIS — E876 Hypokalemia: Secondary | ICD-10-CM

## 2013-03-28 ENCOUNTER — Ambulatory Visit (INDEPENDENT_AMBULATORY_CARE_PROVIDER_SITE_OTHER): Payer: Medicare Other | Admitting: General Practice

## 2013-03-28 DIAGNOSIS — Z7901 Long term (current) use of anticoagulants: Secondary | ICD-10-CM

## 2013-03-28 DIAGNOSIS — I2699 Other pulmonary embolism without acute cor pulmonale: Secondary | ICD-10-CM

## 2013-04-04 ENCOUNTER — Ambulatory Visit (INDEPENDENT_AMBULATORY_CARE_PROVIDER_SITE_OTHER): Payer: Medicare Other | Admitting: Internal Medicine

## 2013-04-04 DIAGNOSIS — I2699 Other pulmonary embolism without acute cor pulmonale: Secondary | ICD-10-CM

## 2013-04-04 DIAGNOSIS — Z7901 Long term (current) use of anticoagulants: Secondary | ICD-10-CM

## 2013-04-04 LAB — POCT INR: INR: 2.7

## 2013-04-11 ENCOUNTER — Other Ambulatory Visit (HOSPITAL_BASED_OUTPATIENT_CLINIC_OR_DEPARTMENT_OTHER): Payer: Medicare Other | Admitting: Lab

## 2013-04-11 ENCOUNTER — Ambulatory Visit (HOSPITAL_BASED_OUTPATIENT_CLINIC_OR_DEPARTMENT_OTHER): Payer: Medicare Other | Admitting: Hematology & Oncology

## 2013-04-11 VITALS — BP 125/48 | HR 66 | Temp 98.2°F | Resp 18 | Ht 66.0 in | Wt 285.0 lb

## 2013-04-11 DIAGNOSIS — I2699 Other pulmonary embolism without acute cor pulmonale: Secondary | ICD-10-CM

## 2013-04-11 DIAGNOSIS — I82409 Acute embolism and thrombosis of unspecified deep veins of unspecified lower extremity: Secondary | ICD-10-CM

## 2013-04-11 DIAGNOSIS — I82402 Acute embolism and thrombosis of unspecified deep veins of left lower extremity: Secondary | ICD-10-CM

## 2013-04-11 LAB — CBC WITH DIFFERENTIAL (CANCER CENTER ONLY)
BASO#: 0 10*3/uL (ref 0.0–0.2)
BASO%: 0.4 % (ref 0.0–2.0)
Eosinophils Absolute: 0.2 10*3/uL (ref 0.0–0.5)
HCT: 45.8 % (ref 38.7–49.9)
HGB: 15.3 g/dL (ref 13.0–17.1)
LYMPH#: 2 10*3/uL (ref 0.9–3.3)
MONO#: 0.7 10*3/uL (ref 0.1–0.9)
NEUT#: 5.5 10*3/uL (ref 1.5–6.5)
NEUT%: 65.8 % (ref 40.0–80.0)
RBC: 5.32 10*6/uL (ref 4.20–5.70)
RDW: 14.9 % (ref 11.1–15.7)
WBC: 8.4 10*3/uL (ref 4.0–10.0)

## 2013-04-11 LAB — D-DIMER, QUANTITATIVE (NOT AT ARMC): D-Dimer, Quant: 0.27 ug/mL-FEU (ref 0.00–0.48)

## 2013-04-11 NOTE — Progress Notes (Signed)
This office note has been dictated.

## 2013-04-12 NOTE — Progress Notes (Signed)
DIAGNOSES: 1. Deep venous thrombosis of the left leg/pulmonary embolism. 2. Possible protein S deficiency.  CURRENT THERAPY:  Coumadin to maintain INR between 2-3.  INTERIM HISTORY:  Tanner [name] comes in for his followup.  He is feeling well.  He has had a stocking for his left leg.  This seems to be helping out a little bit.  He has had no problems with cough or shortness of breath.  There has been no change in bowel or bladder habits.  He has had no rashes.  He has had no bleeding.  There has been no cough or chest wall pain.  He has had no headache.  PHYSICAL EXAMINATION:  General:  This is an obese white gentleman in no obvious distress.  Vital signs:  Temperature of 98.2, pulse 66, respiratory rate 18, blood pressure 125/48.  Weight is 285 pounds. Head/Neck:  A normocephalic atraumatic skull.  There are no ocular or oral lesions.  There are no palpable cervical or supraclavicular lymph nodes.  Lungs:  Clear bilaterally.  Cardiac:  Regular rate and rhythm with a normal S1 and S2.  He has no murmurs, rubs, or bruits.  Abdomen: Obese but soft.  He has good bowel sounds.  There is no fluid wave. There is no palpable abdominal mass.  There is no palpable hepatosplenomegaly.  Extremities:  No venous cord in his legs.  He has a good range motion of his joints.  He has good strength.  Skin:  No rashes, ecchymoses or petechia.  Neurological:  No focal neurological deficits.  LABORATORY STUDIES:  White cell count is 8.4, hemoglobin 15.3, hematocrit 45.8, platelet count 172.  D-dimer is 0.27.  IMPRESSION:  Tanner [name] is a 67 year old gentleman with DVT of the left leg.  He had asymptomatic pulmonary emboli when we checked him for this.  He is on Coumadin.  He had been on Xarelto, so it is hard to say if he developed a pulmonary emboli while on Xarelto.  He will need a year of anticoagulation.  It is hard to say whether he has protein S deficiency.  We will have to check him for this  after he gets off Coumadin.  We will go ahead and plan for another CT angiogram of his chest after Thanksgiving.  I will also do a Doppler of his left leg.  Hopefully, we will see resolution of his thromboembolic event.  The fact that his D-dimer is certainly an encouraging sign.    ______________________________ Josph Macho, M.D. PRE/MEDQ  D:  04/11/2013  T:  04/12/2013  Job:  [id number]

## 2013-04-18 ENCOUNTER — Telehealth: Payer: Self-pay | Admitting: Hematology & Oncology

## 2013-04-18 ENCOUNTER — Ambulatory Visit (INDEPENDENT_AMBULATORY_CARE_PROVIDER_SITE_OTHER): Payer: Medicare Other | Admitting: General Practice

## 2013-04-18 DIAGNOSIS — Z7901 Long term (current) use of anticoagulants: Secondary | ICD-10-CM

## 2013-04-18 DIAGNOSIS — I2699 Other pulmonary embolism without acute cor pulmonale: Secondary | ICD-10-CM

## 2013-04-18 NOTE — Telephone Encounter (Signed)
Pt aware of 12-4 and 12-12 appointments

## 2013-04-20 ENCOUNTER — Other Ambulatory Visit: Payer: Self-pay

## 2013-04-26 ENCOUNTER — Telehealth: Payer: Self-pay | Admitting: Hematology & Oncology

## 2013-04-26 NOTE — Telephone Encounter (Signed)
Pt moved 12-12 to 12-18 his wife is going to [facility] that day

## 2013-05-08 ENCOUNTER — Ambulatory Visit (INDEPENDENT_AMBULATORY_CARE_PROVIDER_SITE_OTHER): Payer: Medicare Other | Admitting: Internal Medicine

## 2013-05-08 ENCOUNTER — Encounter: Payer: Self-pay | Admitting: Internal Medicine

## 2013-05-08 VITALS — BP 124/60 | HR 78 | Temp 99.2°F | Wt 293.0 lb

## 2013-05-08 DIAGNOSIS — M25519 Pain in unspecified shoulder: Secondary | ICD-10-CM

## 2013-05-08 DIAGNOSIS — M25512 Pain in left shoulder: Secondary | ICD-10-CM

## 2013-05-08 NOTE — Progress Notes (Signed)
Pre visit review using our clinic review tool, if applicable. No additional management support is needed unless otherwise documented below in the visit note. 

## 2013-05-09 NOTE — Progress Notes (Signed)
  Subjective:    Patient ID: Tanner [name], male    DOB: 01-07-1946, 67 y.o.   MRN: [id number]  HPI Tanner [name] presents with left shoulder pain. He has some limited ROM. He is not a candidate for injection therapy due to being on coumadin   Review of Systems     Objective:   Physical Exam        Assessment & Plan:  Shoulder pain - left. Due to anti-coagulation he is referred to Dr. Michiel Sites for eval and treatment, with possible U/S guided injection.

## 2013-05-10 ENCOUNTER — Other Ambulatory Visit (INDEPENDENT_AMBULATORY_CARE_PROVIDER_SITE_OTHER): Payer: Medicare Other

## 2013-05-10 ENCOUNTER — Ambulatory Visit (INDEPENDENT_AMBULATORY_CARE_PROVIDER_SITE_OTHER): Payer: Medicare Other | Admitting: Family Medicine

## 2013-05-10 ENCOUNTER — Encounter: Payer: Self-pay | Admitting: Family Medicine

## 2013-05-10 ENCOUNTER — Ambulatory Visit (INDEPENDENT_AMBULATORY_CARE_PROVIDER_SITE_OTHER): Payer: Medicare Other | Admitting: General Practice

## 2013-05-10 VITALS — BP 124/70 | HR 78 | Wt 297.0 lb

## 2013-05-10 DIAGNOSIS — M75100 Unspecified rotator cuff tear or rupture of unspecified shoulder, not specified as traumatic: Secondary | ICD-10-CM

## 2013-05-10 DIAGNOSIS — Z7901 Long term (current) use of anticoagulants: Secondary | ICD-10-CM

## 2013-05-10 DIAGNOSIS — M7550 Bursitis of unspecified shoulder: Secondary | ICD-10-CM | POA: Insufficient documentation

## 2013-05-10 DIAGNOSIS — M7552 Bursitis of left shoulder: Secondary | ICD-10-CM

## 2013-05-10 DIAGNOSIS — M25519 Pain in unspecified shoulder: Secondary | ICD-10-CM

## 2013-05-10 DIAGNOSIS — M25512 Pain in left shoulder: Secondary | ICD-10-CM

## 2013-05-10 DIAGNOSIS — I2699 Other pulmonary embolism without acute cor pulmonale: Secondary | ICD-10-CM

## 2013-05-10 LAB — POCT INR: INR: 2.5

## 2013-05-10 NOTE — Progress Notes (Signed)
I'm seeing this patient by the request  of:  Illene Regulus, MD  CC: Left shoulder pain  HPI: Patient is a very pleasant 67 year old right-hand-dominant male coming in with the complaint of left shoulder pain. Patient does not remember any true injury. Patient states that it started approximately 3-4 weeks ago. Patient was moving some heavy boxes is all he can remember. Patient states that he is not having any significant weakness and only pain with certain movements. Patient states sitting position holding a book seem to make it worse. Patient denies any nighttime awakening. Patient is a severity a 6/10. Patient describes the pain as more of a dull aching with a sharp pain with certain movements. No home modality seem to help.  Of note patient is on chronic Coumadin use for acute pulmonary embolism   Past medical, surgical, family and social history reviewed. Medications reviewed all in the electronic medical record.   Review of Systems: No headache, visual changes, nausea, vomiting, diarrhea, constipation, dizziness, abdominal pain, skin rash, fevers, chills, night sweats, weight loss, swollen lymph nodes, body aches, joint swelling, muscle aches, chest pain, shortness of breath, mood changes.   Objective:    Blood pressure 124/70, pulse 78, weight 297 lb (134.718 kg), SpO2 92.00%.   General: No apparent distress alert and oriented x3 mood and affect normal, dressed appropriately.  HEENT: Pupils equal, extraocular movements intact Respiratory: Patient's speak in full sentences and does not appear short of breath Cardiovascular: No lower extremity edema, non tender, no erythema Skin: Warm dry intact with no signs of infection or rash on extremities or on axial skeleton. Abdomen: Soft nontender Neuro: Cranial nerves II through XII are intact, neurovascularly intact in all extremities with 2+ DTRs and 2+ pulses. Lymph: No lymphadenopathy of posterior or anterior cervical chain or axillae  bilaterally.  Gait normal with good balance and coordination.  MSK: Non tender with full range of motion and good stability and symmetric strength and tone of elbows, wrist, hip, knee and ankles bilaterally. Patient does have crepitus of the knees bilaterally as was the hips bilaterally with mild decrease in internal and of motion bilaterally. Shoulder: Left Inspection reveals no abnormalities, atrophy or asymmetry. Palpation is normal with no tenderness over AC joint or bicipital groove. Patient does have some mild decreased range of motion with external rotation to 20 and internal rotation to L5. Patient has full range of motion of the contralateral side. Rotator cuff strength normal throughout. Positive signs of impingement with negative Neer and Hawkin's tests, but negative empty can sign. Speeds and Yergason's tests normal. No labral pathology noted with negative Obrien's, negative clunk and good stability. Normal scapular function observed. No painful arc and no drop arm sign. No apprehension sign Contralateral shoulder unremarkable  MSK US performed of: Left This study was ordered, performed, and interpreted by Terrilee Files D.O.  Shoulder:   Supraspinatus: Tendon itself looks normal the patient does have a bursal boles her on the acromion. Impingement view. Patient has what appears to be a subacromial bursitis as well .  Infraspinatus:  Appears normal on long and transverse views. Subscapularis:  Appears normal on long and transverse views. Mild hypoechoic changes Teres Minor:  Appears normal on long and transverse views. AC joint:  Capsule distended. Glenohumeral Joint:  Appears normal without effusion. Glenoid Labrum:  Intact without visualized tears. Biceps Tendon:  Appears normal on long and transverse views, no fraying of tendon, tendon located in intertubercular groove, no subluxation with shoulder internal or external  rotation. No increased power doppler signal. Impression:  Subacromial bursitis  Procedure: Real-time Ultrasound Guided Injection of left glenohumeral joint Device: GE Logiq E  Ultrasound guided injection is preferred based studies that show increased duration, increased effect, greater accuracy, decreased procedural pain, increased response rate with ultrasound guided versus blind injection.  Verbal informed consent obtained.  Time-out conducted.  Noted no overlying erythema, induration, or other signs of local infection.  Skin prepped in a sterile fashion.  Local anesthesia: Topical Ethyl chloride.  With sterile technique and under real time ultrasound guidance:  Joint visualized.  23g 1  inch needle inserted posterior approach. Pictures taken for needle placement. Patient did have injection of 2 cc of 1% lidocaine, 2 cc of 0.5% Marcaine, and 1cc of Kenalog 40 mg/dL. Completed without difficulty  Pain immediately resolved suggesting accurate placement of the medication.  Advised to call if fevers/chills, erythema, induration, drainage, or persistent bleeding.  Images permanently stored and available for review in the ultrasound unit.  Impression: Technically successful ultrasound guided injection.    Impression and Recommendations:     This case required medical decision making of moderate complexity.

## 2013-05-10 NOTE — Assessment & Plan Note (Signed)
Patient did have what appeared to be a subacromial bursitis. No rotator cuff the tear appreciated. I do think is a good idea to do ultrasound guidance injection today secondary to patient's Coumadin used to make sure no vessel was injured. It is not a contraindication to do an injection the patient is on anticoagulants. Patient tolerated the procedure well. Patient was given home exercise program. Discussed trying tramadol and 6 hours to avoid post injection flare. Patient will try these interventions and come back and see me again in 4 weeks if continuing pain. At that time it was about potential cervical radiculopathy as a cause with patient's ultrasound being only positive for minimal bursitis.

## 2013-05-10 NOTE — Patient Instructions (Signed)
Very nice to meet you Do homework daily.  Ice 20 minutes 2 times a day can help.  Maybe see me again 4 weeks.

## 2013-05-15 ENCOUNTER — Ambulatory Visit: Payer: Medicare Other | Admitting: Internal Medicine

## 2013-05-18 ENCOUNTER — Other Ambulatory Visit: Payer: Self-pay | Admitting: *Deleted

## 2013-05-18 ENCOUNTER — Ambulatory Visit (HOSPITAL_BASED_OUTPATIENT_CLINIC_OR_DEPARTMENT_OTHER)
Admission: RE | Admit: 2013-05-18 | Discharge: 2013-05-18 | Disposition: A | Discharge status: Home / Self Care | Payer: Medicare Other | Source: Ambulatory Visit | Attending: Hematology & Oncology | Admitting: Hematology & Oncology

## 2013-05-18 ENCOUNTER — Other Ambulatory Visit (HOSPITAL_BASED_OUTPATIENT_CLINIC_OR_DEPARTMENT_OTHER): Payer: Medicare Other | Admitting: Lab

## 2013-05-18 DIAGNOSIS — Z7901 Long term (current) use of anticoagulants: Secondary | ICD-10-CM

## 2013-05-18 DIAGNOSIS — I82402 Acute embolism and thrombosis of unspecified deep veins of left lower extremity: Secondary | ICD-10-CM

## 2013-05-18 DIAGNOSIS — I2699 Other pulmonary embolism without acute cor pulmonale: Secondary | ICD-10-CM

## 2013-05-18 DIAGNOSIS — I825Y9 Chronic embolism and thrombosis of unspecified deep veins of unspecified proximal lower extremity: Secondary | ICD-10-CM | POA: Insufficient documentation

## 2013-05-18 LAB — BASIC METABOLIC PANEL - CANCER CENTER ONLY
BUN, Bld: 25 mg/dL — ABNORMAL HIGH (ref 7–22)
Chloride: 101 mEq/L (ref 98–108)
Creat: 1.2 mg/dl (ref 0.6–1.2)
Glucose, Bld: 93 mg/dL (ref 73–118)
Potassium: 4.1 mEq/L (ref 3.3–4.7)

## 2013-05-18 MED ORDER — IOHEXOL 350 MG/ML SOLN
80.0000 mL | Freq: Once | INTRAVENOUS | Status: AC | PRN
  Administered 2013-05-18: 80 mL via INTRAVENOUS

## 2013-05-26 ENCOUNTER — Ambulatory Visit: Payer: Medicare Other | Admitting: Hematology & Oncology

## 2013-06-01 ENCOUNTER — Ambulatory Visit (HOSPITAL_BASED_OUTPATIENT_CLINIC_OR_DEPARTMENT_OTHER): Payer: Medicare Other | Admitting: Hematology & Oncology

## 2013-06-01 VITALS — BP 133/65 | HR 78 | Temp 98.0°F | Resp 18 | Ht 66.0 in | Wt 290.0 lb

## 2013-06-01 DIAGNOSIS — I82402 Acute embolism and thrombosis of unspecified deep veins of left lower extremity: Secondary | ICD-10-CM

## 2013-06-01 DIAGNOSIS — I82409 Acute embolism and thrombosis of unspecified deep veins of unspecified lower extremity: Secondary | ICD-10-CM

## 2013-06-01 DIAGNOSIS — I2699 Other pulmonary embolism without acute cor pulmonale: Secondary | ICD-10-CM

## 2013-06-01 NOTE — Progress Notes (Signed)
This office note has been dictated.

## 2013-06-02 NOTE — Progress Notes (Signed)
DIAGNOSES: 1. Deep vein thrombosis of the left leg. 2. Pulmonary embolus. 3. Possible protein S deficiency.  CURRENT THERAPY:  Coumadin to maintain INR between 2-3.  INTERIM HISTORY:  [name] comes in for followup.  He has been doing fairly well.  It has now been a little over 3 months since he initially presented with the DVT and pulmonary embolism.  The pulmonary embolism was asymptomatic.  We did go ahead and repeat a CT angiogram.  This was done on December 4th.  This did not show any residual pulmonary embolism in his lungs.  We then did a venous ultrasound of his left leg.  This shows residual thrombus.  This was canalized.  This was located in the distal femoral vein.  Otherwise, the rest of the left lower leg looked okay.  He is feeling well.  He is wearing compression hose for the left leg.  He has had no bleeding with the Coumadin.  His INR is being followed by his primary doctor.  He has had no cough or shortness of breath.  He has had no rashes.  He has had no headache.  He has had fever.  He has had no change in bowel or bladder habits.  PHYSICAL EXAMINATION:  General:  This is a morbidly obese white gentleman in no obvious distress.  Vital Signs:  Temperature of 98, pulse 70, respiratory rate 18, blood pressure 133/65.  Weight is 290 pounds.  Head and Neck:  Normocephalic, atraumatic skull.  There are no ocular or oral lesions.  He has no palpable cervical or supraclavicular lymph nodes.  Lungs:  Clear bilaterally.  There is a slight decrease at the bases.  Cardiac:  Regular rate and rhythm with normal S1, S2.  There are no murmurs, rubs, or bruits.  Abdomen:  Obese but soft.  He has good bowel sounds.  There is no fluid wave.  There is no palpable abdominal mass.  There is no palpable hepatosplenomegaly.  Back:  No tenderness over the spine, ribs, or hips.  Extremities:  Show no clubbing, cyanosis, or edema.  He has a compression stocking on his left leg.   No obvious venous cord is noted in the left lower leg.  He has good pulses in both distal extremities.  Skin:  No rashes, ecchymosis, or petechia. Neurologic:  Shows no focal neurological deficits.  LABORATORY STUDIES:  Were not done this visit.  IMPRESSION:  [name] is a 67 year old gentleman.  He had a left lower extremity deep venous thrombosis.  We then found he had an asymptomatic pulmonary embolism.  He was on Xarelto.  We had to admit him.  We got him on heparin.  We then put him on Coumadin.  It is hard to say whether or not he does have the protein S deficiency. He is on Coumadin and we have to keep him on Coumadin.  I still think that we will need 1 year of anticoagulation.  I told him that the changes in his left leg will be present and maybe it may improve, but would probably never go away totally.  We probably will not repeat another Doppler for about 6 months.  I do not see that we have to repeat a CT angiogram unless he has symptoms.  He has done very, very well.  I will plan to see him back myself in 3 months.    ______________________________ Josph Macho, M.D. PRE/MEDQ  D:  06/01/2013  T:  06/02/2013  Job:  [id number] 

## 2013-06-07 ENCOUNTER — Ambulatory Visit (INDEPENDENT_AMBULATORY_CARE_PROVIDER_SITE_OTHER): Payer: Medicare Other | Admitting: General Practice

## 2013-06-07 DIAGNOSIS — Z7901 Long term (current) use of anticoagulants: Secondary | ICD-10-CM

## 2013-06-07 DIAGNOSIS — I2699 Other pulmonary embolism without acute cor pulmonale: Secondary | ICD-10-CM

## 2013-06-07 NOTE — Progress Notes (Signed)
Pre-visit discussion using our clinic review tool. No additional management support is needed unless otherwise documented below in the visit note.  

## 2013-07-04 ENCOUNTER — Encounter: Payer: Self-pay | Admitting: *Deleted

## 2013-07-04 NOTE — Progress Notes (Signed)
[employer]  Holiday representative met with pt and pt's wife at [facility] to review and complete Regulatory affairs officer.  CSW, pt, and pt's wife reviewed AD booklet and both pt and wife completed individual documents.  Documents were witnessed and notarized.  Copies were given to pt and wife, and a copy will also be placed in pt's medical record.    Tanner [name], MSW, [place] Worker [facility] [phone]

## 2013-07-05 ENCOUNTER — Ambulatory Visit (INDEPENDENT_AMBULATORY_CARE_PROVIDER_SITE_OTHER): Payer: Managed Care, Other (non HMO) | Admitting: General Practice

## 2013-07-05 DIAGNOSIS — Z7901 Long term (current) use of anticoagulants: Secondary | ICD-10-CM

## 2013-07-05 DIAGNOSIS — I2699 Other pulmonary embolism without acute cor pulmonale: Secondary | ICD-10-CM

## 2013-07-05 LAB — POCT INR: INR: 2.3

## 2013-07-05 NOTE — Progress Notes (Signed)
Pre-visit discussion using our clinic review tool. No additional management support is needed unless otherwise documented below in the visit note.  

## 2013-08-09 ENCOUNTER — Ambulatory Visit (INDEPENDENT_AMBULATORY_CARE_PROVIDER_SITE_OTHER): Payer: Managed Care, Other (non HMO) | Admitting: General Practice

## 2013-08-09 DIAGNOSIS — Z7901 Long term (current) use of anticoagulants: Secondary | ICD-10-CM

## 2013-08-09 DIAGNOSIS — Z5181 Encounter for therapeutic drug level monitoring: Secondary | ICD-10-CM

## 2013-08-09 DIAGNOSIS — I2699 Other pulmonary embolism without acute cor pulmonale: Secondary | ICD-10-CM

## 2013-08-09 LAB — POCT INR: INR: 2.9

## 2013-08-09 NOTE — Progress Notes (Signed)
Pre visit review using our clinic review tool, if applicable. No additional management support is needed unless otherwise documented below in the visit note. 

## 2013-08-14 ENCOUNTER — Telehealth: Payer: Self-pay | Admitting: *Deleted

## 2013-08-14 MED ORDER — WARFARIN SODIUM 5 MG PO TABS: ORAL_TABLET | ORAL | Status: DC

## 2013-08-14 NOTE — Telephone Encounter (Signed)
Spouse phoned stating that patient's coumadin (only-at this time) needed to be changed to [facility] [address].  Refilled per protocol (only needed for new pharmacy).  Spouse also requests for PCP to make PCP recommendation for once PCP retires.  CB# [phone]

## 2013-08-14 NOTE — Telephone Encounter (Signed)
PCP recommendation - Dr. Jenny Reichmann

## 2013-08-14 NOTE — Telephone Encounter (Signed)
Phoned patient & relayed MD's recommendation.

## 2013-08-31 ENCOUNTER — Other Ambulatory Visit (HOSPITAL_BASED_OUTPATIENT_CLINIC_OR_DEPARTMENT_OTHER): Payer: Managed Care, Other (non HMO) | Admitting: Lab

## 2013-08-31 ENCOUNTER — Ambulatory Visit (HOSPITAL_BASED_OUTPATIENT_CLINIC_OR_DEPARTMENT_OTHER): Payer: Managed Care, Other (non HMO) | Admitting: Hematology & Oncology

## 2013-08-31 ENCOUNTER — Encounter: Payer: Self-pay | Admitting: Hematology & Oncology

## 2013-08-31 VITALS — BP 143/71 | HR 80 | Temp 98.5°F | Resp 20 | Wt 298.0 lb

## 2013-08-31 DIAGNOSIS — I82409 Acute embolism and thrombosis of unspecified deep veins of unspecified lower extremity: Secondary | ICD-10-CM

## 2013-08-31 DIAGNOSIS — I82402 Acute embolism and thrombosis of unspecified deep veins of left lower extremity: Secondary | ICD-10-CM

## 2013-08-31 DIAGNOSIS — I2699 Other pulmonary embolism without acute cor pulmonale: Secondary | ICD-10-CM

## 2013-08-31 LAB — CBC WITH DIFFERENTIAL (CANCER CENTER ONLY)
BASO#: 0.1 10*3/uL (ref 0.0–0.2)
BASO%: 0.5 % (ref 0.0–2.0)
EOS%: 3.8 % (ref 0.0–7.0)
Eosinophils Absolute: 0.4 10*3/uL (ref 0.0–0.5)
HCT: 44.7 % (ref 38.7–49.9)
HGB: 14.9 g/dL (ref 13.0–17.1)
LYMPH#: 1.9 10*3/uL (ref 0.9–3.3)
LYMPH%: 19.8 % (ref 14.0–48.0)
MCH: 29.6 pg (ref 28.0–33.4)
MCHC: 33.3 g/dL (ref 32.0–35.9)
MCV: 89 fL (ref 82–98)
MONO#: 0.7 10*3/uL (ref 0.1–0.9)
MONO%: 7.1 % (ref 0.0–13.0)
NEUT#: 6.6 10*3/uL — ABNORMAL HIGH (ref 1.5–6.5)
NEUT%: 68.8 % (ref 40.0–80.0)
PLATELETS: 199 10*3/uL (ref 145–400)
RBC: 5.03 10*6/uL (ref 4.20–5.70)
RDW: 15.3 % (ref 11.1–15.7)
WBC: 9.6 10*3/uL (ref 4.0–10.0)

## 2013-09-01 NOTE — Progress Notes (Signed)
Hematology and Oncology Follow Up Visit  Tanner [name] [id number] December 16, 1945 68 y.o. 09/01/2013   Principle Diagnosis:   DVT of the left leg.  Pulmonary embolus  Possible protein S deficiency  Current Therapy:    Coumadin to maintain INR between 2-3     Interim History:  Tanner [name] is in for followup. He's doing fairly well. His wife recently underwent breast surgery. She had a fairly extensive carcinoma in situ.  He's had done well. He's had no cough or shortness of breath. He still has some swelling in the left leg. His last Doppler showed partial resolution.  Is no bleeding. He's had no chest pain. There's been no cough. He's had no change in bowel or bladder habits. There's been no rashes.  Medications: Current outpatient prescriptions:acetaminophen (TYLENOL) 500 MG tablet, Take 2 tablets (1,000 mg total) by mouth every 8 (eight) hours as needed for pain., Disp: 30 tablet, Rfl: 0;  atenolol (TENORMIN) 25 MG tablet, Take 12.5 mg by mouth daily., Disp: , Rfl: ;  buPROPion (WELLBUTRIN SR) 150 MG 12 hr tablet, Take 150 mg by mouth 2 (two) times daily., Disp: , Rfl: ;  finasteride (PROSCAR) 5 MG tablet, Take 5 mg by mouth at bedtime., Disp: , Rfl:  gabapentin (NEURONTIN) 300 MG capsule, Take 300 mg by mouth daily., Disp: , Rfl: ;  hydrochlorothiazide (HYDRODIURIL) 25 MG tablet, Take 25 mg by mouth daily., Disp: , Rfl: ;  ketoconazole (NIZORAL) 2 % shampoo, Apply topically daily., Disp: , Rfl: ;  Menthol-Methyl Salicylate (THERA-GESIC PLUS) CREA, Apply 1 application topically at bedtime. Apply to lower back, Disp: , Rfl:  NEOMYCIN-POLYMYXIN-HYDROCORTISONE (CORTISPORIN) 1 % SOLN otic solution, Place 3 drops into both ears 3 (three) times daily as needed (for fungal infection)., Disp: , Rfl: ;  traMADol (ULTRAM) 50 MG tablet, Take 100 mg by mouth 3 (three) times daily. Takes 2 tabs, Disp: , Rfl: ;  warfarin (COUMADIN) 5 MG tablet, Take as directed by coumadin clinic, Disp: 35 tablet, Rfl:  2 zolpidem (AMBIEN) 10 MG tablet, Take 10 mg by mouth at bedtime. For sleep, Disp: , Rfl: ;  Multiple Vitamins-Minerals (MULTIVITAMINS THER. W/MINERALS) TABS, Take 1 tablet by mouth daily., Disp: , Rfl:   Allergies:  Allergies  Allergen Reactions  . Codeine Sulfate Itching and Nausea Only    Past Medical History, Surgical history, Social history, and Family History were reviewed and updated.  Review of Systems: As above  Physical Exam:  weight is 298 lb (135.172 kg). His temperature is 98.5 F (36.9 C). His blood pressure is 143/71 and his pulse is 80. His respiration is 20.   Obese gentleman. Lungs are clear. Cardiac exam regular in rhythm. No murmurs rubs or bruits. Abdomen obese but soft. Has good bowel sounds. There is no fluid wave. There is no palpable liver or spleen tip. Back exam no tenderness over the spine ribs or hips. Extremities shows some mild chronic nonpitting edema of the left leg. No venous cord is noted. No problems were noted with the right leg. She has good pulses. Skin exam no rashes. Neurological exam nonfocal.  Lab Results  Component Value Date   WBC 9.6 08/31/2013   HGB 14.9 08/31/2013   HCT 44.7 08/31/2013   MCV 89 08/31/2013   PLT 199 08/31/2013     Chemistry      Component Value Date/Time   NA 138 05/18/2013 0949   NA 137 03/01/2013 1546   K 4.1 05/18/2013 0949   K  3.8 03/01/2013 1546   CL 101 05/18/2013 0949   CL 99 03/01/2013 1546   CO2 30 05/18/2013 0949   CO2 31 03/01/2013 1546   BUN 25* 05/18/2013 0949   BUN 19 03/01/2013 1546   CREATININE 1.2 05/18/2013 0949   CREATININE 1.5 03/01/2013 1546      Component Value Date/Time   CALCIUM 8.9 05/18/2013 0949   CALCIUM 9.1 03/01/2013 1546   ALKPHOS 72 03/01/2013 1546   AST 14 03/01/2013 1546   ALT 19 03/01/2013 1546   BILITOT 0.6 03/01/2013 1546         Impression and Plan: Tanner [name] is 68 year old gentleman with a DVT of the left leg. Also has a pulmonary embolism. The pulmonary embolism has resolved by  his last scan.  I will plan to see him back in 3 months. I will get a Doppler of his leg at that time.  I believe that one year of therapeutic anticoagulation will be indicated. We will complete this in September of 2015. After this, then I'll use aspirin.  Levophed his like to her surgery. She has severe lung disease and it was a tough decision to put her through surgery but she made it.   Volanda Napoleon, MD 3/20/20156:21 PM

## 2013-09-04 LAB — D-DIMER, QUANTITATIVE: D-Dimer, Quant: 0.27 ug/mL-FEU (ref 0.00–0.48)

## 2013-09-04 LAB — PROTEIN S, TOTAL: Protein S Total: 37 % — ABNORMAL LOW (ref 60–150)

## 2013-09-20 ENCOUNTER — Ambulatory Visit (INDEPENDENT_AMBULATORY_CARE_PROVIDER_SITE_OTHER): Payer: Managed Care, Other (non HMO) | Admitting: General Practice

## 2013-09-20 DIAGNOSIS — I2699 Other pulmonary embolism without acute cor pulmonale: Secondary | ICD-10-CM

## 2013-09-20 DIAGNOSIS — Z7901 Long term (current) use of anticoagulants: Secondary | ICD-10-CM

## 2013-09-20 DIAGNOSIS — Z5181 Encounter for therapeutic drug level monitoring: Secondary | ICD-10-CM

## 2013-09-20 LAB — POCT INR: INR: 2.9

## 2013-09-20 NOTE — Progress Notes (Signed)
Pre visit review using our clinic review tool, if applicable. No additional management support is needed unless otherwise documented below in the visit note. 

## 2013-09-21 ENCOUNTER — Encounter: Payer: Self-pay | Admitting: *Deleted

## 2013-09-21 ENCOUNTER — Telehealth: Payer: Self-pay | Admitting: *Deleted

## 2013-09-21 NOTE — Telephone Encounter (Signed)
Received call from patient stating that he needs instructions about how to manage Coumadin with upcoming dental surgery involving extractions.  Letter written per dr. Marin Olp instructions and will be put up front for patient to pick up.  Told patient that he needs to call us with a surgery date so we could send prescription for Arixtra as per letter.  Patient states that he has given himself shots in the stomach before. Patient agrees to let us know

## 2013-10-03 ENCOUNTER — Other Ambulatory Visit: Payer: Self-pay | Admitting: *Deleted

## 2013-10-03 MED ORDER — WARFARIN SODIUM 5 MG PO TABS: ORAL_TABLET | ORAL | Status: DC

## 2013-10-25 ENCOUNTER — Other Ambulatory Visit: Payer: Self-pay | Admitting: Hematology & Oncology

## 2013-10-25 DIAGNOSIS — I82402 Acute embolism and thrombosis of unspecified deep veins of left lower extremity: Secondary | ICD-10-CM

## 2013-10-25 MED ORDER — FONDAPARINUX SODIUM 10 MG/0.8ML ~~LOC~~ SOLN: 10.0000 mg | SUBCUTANEOUS | Status: DC

## 2013-10-30 ENCOUNTER — Telehealth: Payer: Self-pay | Admitting: *Deleted

## 2013-10-30 ENCOUNTER — Encounter: Payer: Self-pay | Admitting: *Deleted

## 2013-10-30 NOTE — Telephone Encounter (Signed)
Pt's wife called stating that the pt has been having excruciating pain from his teeth and he cannot swallow his Amoxicillin or pain medicine. She also states that pt cannot talk. Wife is concerned about pt and wants to know what Dr Marin Olp thinks he should do. Pt has appt. With dental surgeon this afternoon at 2:30 and the wife is unsure if they should attend this appt or go to the ER. I let pt know that Dr Marin Olp thinks the pt should go to the ER. I suggested pt call the surgeons office first before heading to the ER.

## 2013-11-29 ENCOUNTER — Encounter: Payer: Self-pay | Admitting: Family Medicine

## 2013-11-29 ENCOUNTER — Ambulatory Visit (INDEPENDENT_AMBULATORY_CARE_PROVIDER_SITE_OTHER): Payer: Medicare HMO | Admitting: Family Medicine

## 2013-11-29 ENCOUNTER — Ambulatory Visit (INDEPENDENT_AMBULATORY_CARE_PROVIDER_SITE_OTHER): Payer: Medicare HMO | Admitting: General Practice

## 2013-11-29 ENCOUNTER — Other Ambulatory Visit (INDEPENDENT_AMBULATORY_CARE_PROVIDER_SITE_OTHER): Payer: Managed Care, Other (non HMO)

## 2013-11-29 ENCOUNTER — Ambulatory Visit: Payer: Medicare HMO

## 2013-11-29 VITALS — BP 106/72 | HR 69 | Ht 66.0 in | Wt 280.0 lb

## 2013-11-29 DIAGNOSIS — Z5181 Encounter for therapeutic drug level monitoring: Secondary | ICD-10-CM

## 2013-11-29 DIAGNOSIS — I2699 Other pulmonary embolism without acute cor pulmonale: Secondary | ICD-10-CM

## 2013-11-29 DIAGNOSIS — M79601 Pain in right arm: Secondary | ICD-10-CM

## 2013-11-29 DIAGNOSIS — Z7901 Long term (current) use of anticoagulants: Secondary | ICD-10-CM

## 2013-11-29 DIAGNOSIS — M7550 Bursitis of unspecified shoulder: Secondary | ICD-10-CM

## 2013-11-29 DIAGNOSIS — M79609 Pain in unspecified limb: Secondary | ICD-10-CM

## 2013-11-29 DIAGNOSIS — IMO0002 Reserved for concepts with insufficient information to code with codable children: Secondary | ICD-10-CM

## 2013-11-29 DIAGNOSIS — M75100 Unspecified rotator cuff tear or rupture of unspecified shoulder, not specified as traumatic: Secondary | ICD-10-CM

## 2013-11-29 LAB — PROTIME-INR
INR: 5.6 ratio — AB (ref 0.8–1.0)
PROTHROMBIN TIME: 59.7 s — AB (ref 9.6–13.1)

## 2013-11-29 NOTE — Assessment & Plan Note (Signed)
Patient did have injection as described above. Patient did tolerate the procedure well with near complete resolution of pain immediately. Patient was given home exercise program to do a regular basis as well as a pacing protocol. Patient will try these interventions and come back and see me again in 3-4 weeks for further evaluation.  Spent greater than 25 minutes with patient face-to-face and had greater than 50% of counseling including as described above in assessment and plan.

## 2013-11-29 NOTE — Patient Instructions (Signed)
Good to see you again.  Ice 20 minutes 2 times a day can help Exercises 3 times a week.  See me when you need me.

## 2013-11-29 NOTE — Progress Notes (Signed)
Pre visit review using our clinic review tool, if applicable. No additional management support is needed unless otherwise documented below in the visit note. 

## 2013-11-29 NOTE — Progress Notes (Signed)
[name] [place], [place] [address] Phone: [phone] Subjective:     CC: right arm pain.   JHE:[id number] Tanner [name] is a 68 y.o. male coming in with complaint of right arm pain. Patient states that this does somewhat similar to his left arm previously. Patient was given an injection in his left shoulder didn't seem to completely resolve that problem. Patient states though this one seems to be worse than the other side. Patient states that it is affecting his daily activities. Patient states that certain movements he has sharp pain that can radiate down his arm. Denies any weakness toe. Patient states the pain can wake him up at night. Has tried over-the-counter medicines with minimal improvement. States that the pain is 9/10.     Past medical history, social, surgical and family history all reviewed in electronic medical record.   Review of Systems: No headache, visual changes, nausea, vomiting, diarrhea, constipation, dizziness, abdominal pain, skin rash, fevers, chills, night sweats, weight loss, swollen lymph nodes, body aches, joint swelling, muscle aches, chest pain, shortness of breath, mood changes.   Objective Blood pressure 106/72, pulse 69, height 5\' 6"  (1.676 m), weight 280 lb (127.007 kg), SpO2 90.00%.  General: No apparent distress alert and oriented x3 mood and affect normal, dressed appropriately.  HEENT: Pupils equal, extraocular movements intact  Respiratory: Patient's speak in full sentences and does not appear short of breath  Cardiovascular: No lower extremity edema, non tender, no erythema  Skin: Warm dry intact with no signs of infection or rash on extremities or on axial skeleton.  Abdomen: Soft nontender  Neuro: Cranial nerves II through XII are intact, neurovascularly intact in all extremities with 2+ DTRs and 2+ pulses.  Lymph: No lymphadenopathy of posterior or anterior cervical chain or axillae bilaterally.    Gait normal with good balance and coordination.  MSK:  Non tender with full range of motion and good stability and symmetric strength and tone of  elbows, wrist, hip, knee and ankles bilaterally.  Shoulder: Right Inspection reveals no abnormalities, atrophy or asymmetry. Palpation is normal with no tenderness over AC joint or bicipital groove. ROM is full in all planes passively. Rotator cuff strength normal throughout. signs of impingement with positive Neer and Hawkin's tests, but negative empty can sign. Speeds and Yergason's tests normal. No labral pathology noted with negative Obrien's, negative clunk and good stability. Normal scapular function observed. No painful arc and no drop arm sign. No apprehension sign  MSK US performed of: Right This study was ordered, performed, and interpreted by Charlann Boxer D.O.  Shoulder:   Supraspinatus:  Appears normal on long and transverse views, Bursal bulge seen with shoulder abduction on impingement view. Infraspinatus:  Appears normal on long and transverse views. Significant increase in Doppler flow Subscapularis:  Appears normal on long and transverse views. Positive bursa Teres Minor:  Appears normal on long and transverse views. AC joint:  Capsule undistended, no geyser sign. Glenohumeral Joint:  Appears normal without effusion. Glenoid Labrum:  Intact without visualized tears. Biceps Tendon:  Appears normal on long and transverse views, no fraying of tendon, tendon located in intertubercular groove, no subluxation with shoulder internal or external rotation.  Impression: Subacromial bursitis  Procedure: Real-time Ultrasound Guided Injection of right glenohumeral joint Device: GE Logiq E  Ultrasound guided injection is preferred based studies that show increased duration, increased effect, greater accuracy, decreased procedural pain, increased response rate with ultrasound guided versus blind  injection.  Verbal informed consent  obtained.  Time-out conducted.  Noted no overlying erythema, induration, or other signs of local infection.  Skin prepped in a sterile fashion.  Local anesthesia: Topical Ethyl chloride.  With sterile technique and under real time ultrasound guidance:  Joint visualized.  23g 1  inch needle inserted posterior approach. Pictures taken for needle placement. Patient did have injection of 2 cc of 1% lidocaine, 2 cc of 0.5% Marcaine, and 1.0 cc of Kenalog 40 mg/dL. Completed without difficulty  Pain immediately resolved suggesting accurate placement of the medication.  Advised to call if fevers/chills, erythema, induration, drainage, or persistent bleeding.  Images permanently stored and available for review in the ultrasound unit.  Impression: Technically successful ultrasound guided injection.    Impression and Recommendations:     This case required medical decision making of moderate complexity.

## 2013-11-30 ENCOUNTER — Encounter: Payer: Self-pay | Admitting: Hematology & Oncology

## 2013-11-30 ENCOUNTER — Other Ambulatory Visit (HOSPITAL_BASED_OUTPATIENT_CLINIC_OR_DEPARTMENT_OTHER): Payer: Managed Care, Other (non HMO) | Admitting: Lab

## 2013-11-30 ENCOUNTER — Ambulatory Visit (HOSPITAL_BASED_OUTPATIENT_CLINIC_OR_DEPARTMENT_OTHER)
Admission: RE | Admit: 2013-11-30 | Discharge: 2013-11-30 | Disposition: A | Discharge status: Home / Self Care | Payer: Medicare HMO | Source: Ambulatory Visit | Attending: Hematology & Oncology | Admitting: Hematology & Oncology

## 2013-11-30 ENCOUNTER — Ambulatory Visit (HOSPITAL_BASED_OUTPATIENT_CLINIC_OR_DEPARTMENT_OTHER): Payer: Managed Care, Other (non HMO) | Admitting: Hematology & Oncology

## 2013-11-30 VITALS — BP 122/58 | HR 70 | Temp 98.1°F | Resp 20 | Ht 66.0 in | Wt 278.0 lb

## 2013-11-30 DIAGNOSIS — I82402 Acute embolism and thrombosis of unspecified deep veins of left lower extremity: Secondary | ICD-10-CM

## 2013-11-30 DIAGNOSIS — I2699 Other pulmonary embolism without acute cor pulmonale: Secondary | ICD-10-CM

## 2013-11-30 DIAGNOSIS — I82409 Acute embolism and thrombosis of unspecified deep veins of unspecified lower extremity: Secondary | ICD-10-CM

## 2013-11-30 DIAGNOSIS — R197 Diarrhea, unspecified: Secondary | ICD-10-CM

## 2013-11-30 LAB — CBC WITH DIFFERENTIAL (CANCER CENTER ONLY)
BASO#: 0 10*3/uL (ref 0.0–0.2)
BASO%: 0.2 % (ref 0.0–2.0)
EOS ABS: 0.1 10*3/uL (ref 0.0–0.5)
EOS%: 0.4 % (ref 0.0–7.0)
HCT: 47.3 % (ref 38.7–49.9)
HEMOGLOBIN: 16 g/dL (ref 13.0–17.1)
LYMPH#: 1.4 10*3/uL (ref 0.9–3.3)
LYMPH%: 8.3 % — ABNORMAL LOW (ref 14.0–48.0)
MCH: 29 pg (ref 28.0–33.4)
MCHC: 33.8 g/dL (ref 32.0–35.9)
MCV: 86 fL (ref 82–98)
MONO#: 1 10*3/uL — ABNORMAL HIGH (ref 0.1–0.9)
MONO%: 5.9 % (ref 0.0–13.0)
NEUT#: 14.2 10*3/uL — ABNORMAL HIGH (ref 1.5–6.5)
NEUT%: 85.2 % — ABNORMAL HIGH (ref 40.0–80.0)
PLATELETS: 234 10*3/uL (ref 145–400)
RBC: 5.51 10*6/uL (ref 4.20–5.70)
RDW: 14.8 % (ref 11.1–15.7)
WBC: 16.6 10*3/uL — AB (ref 4.0–10.0)

## 2013-11-30 MED ORDER — METRONIDAZOLE 500 MG PO TABS: 500.0000 mg | ORAL_TABLET | Freq: Three times a day (TID) | ORAL | Status: DC

## 2013-11-30 NOTE — Progress Notes (Signed)
Hematology and Oncology Follow Up Visit  Tanner [name] [id number] 12/29/1945 68 y.o. 11/30/2013   Principle Diagnosis:   DVT of the left leg.  Pulmonary embolus  Possible protein S deficiency  Current Therapy:    Patient to finish Coumadin today. He will start aspirin 162 mg by mouth daily     Interim History:  [name] is back for followup. We will ahead and do a Doppler of his left leg. This is negative for any thrombus.  He's having more problems with his Coumadin. He had his level checked 2 days ago his INR was 5.6.  I really believe that we can get him off Coumadin now. I wanted one year but is becoming more of a difficulty for 2 him therapeutic. I think this is we cannot document any thrombus in his left leg, further anticoagulation would not be beneficial.  I will get him on aspirin. He will take 162 mg daily with food.  His problem now is that he has diarrhea. He has had for about 10 days. This happened after he took antibiotics for a mouth infection.  I will get him on some Flagyl.  He did not have a family doctor now. He probably needs to be referred to one. We will have to see what we can do.  He has had no cough. There has been no shortness of breath. He has had no bleeding.  Medications: Current outpatient prescriptions:acetaminophen (TYLENOL) 500 MG tablet, Take 2 tablets (1,000 mg total) by mouth every 8 (eight) hours as needed for pain., Disp: 30 tablet, Rfl: 0;  atenolol (TENORMIN) 25 MG tablet, Take 12.5 mg by mouth daily., Disp: , Rfl: ;  buPROPion (WELLBUTRIN SR) 150 MG 12 hr tablet, Take 150 mg by mouth 2 (two) times daily., Disp: , Rfl: ;  finasteride (PROSCAR) 5 MG tablet, Take 5 mg by mouth at bedtime., Disp: , Rfl:  fondaparinux (ARIXTRA) 10 MG/0.8ML SOLN injection, Inject 10 mg into the skin as directed., Disp: , Rfl: ;  gabapentin (NEURONTIN) 300 MG capsule, Take 300 mg by mouth daily., Disp: , Rfl: ;  hydrochlorothiazide (HYDRODIURIL) 25 MG tablet, Take  25 mg by mouth daily., Disp: , Rfl: ;  ketoconazole (NIZORAL) 2 % shampoo, Apply topically daily., Disp: , Rfl:  Menthol-Methyl Salicylate (THERA-GESIC PLUS) CREA, Apply 1 application topically at bedtime. Apply to lower back, Disp: , Rfl: ;  Multiple Vitamins-Minerals (MULTIVITAMINS THER. W/MINERALS) TABS, Take 1 tablet by mouth daily., Disp: , Rfl: ;  NEOMYCIN-POLYMYXIN-HYDROCORTISONE (CORTISPORIN) 1 % SOLN otic solution, Place 3 drops into both ears 3 (three) times daily as needed (for fungal infection)., Disp: , Rfl:  traMADol (ULTRAM) 50 MG tablet, Take 100 mg by mouth 3 (three) times daily. Takes 2 tabs, Disp: , Rfl: ;  warfarin (COUMADIN) 5 MG tablet, Take 5 mg by mouth daily at 6 PM. HOLD FOR 6-17--19-2017 RESTART @@ 1/2 TAB 6-20-, Disp: , Rfl: ;  zolpidem (AMBIEN) 10 MG tablet, Take 10 mg by mouth at bedtime. For sleep, Disp: , Rfl:   Allergies:  Allergies  Allergen Reactions  . Codeine Sulfate Itching and Nausea Only    Past Medical History, Surgical history, Social history, and Family History were reviewed and updated.  Review of Systems: As above  Physical Exam:  height is 5\' 6"  (1.676 m) and weight is 278 lb (126.1 kg). His oral temperature is 98.1 F (36.7 C). His blood pressure is 122/58 and his pulse is 70. His respiration is 20.  Obese white gentleman. Lungs are clear. Cardiac exam regular in rhythm with no murmurs rubs or bruits. Abdomen is soft. He is obese. She has good bowel sounds. There is no palpable liver or spleen tip. Back exam shows no tenderness over the spine ribs or hips. Extremities shows mild nonpitting edema of his lower legs. This is chronic. No venous cord is noted. Skin exam is slightly dry. No ecchymoses or petechia are noted. Neurological exam is nonfocal.  Lab Results  Component Value Date   WBC 16.6* 11/30/2013   HGB 16.0 11/30/2013   HCT 47.3 11/30/2013   MCV 86 11/30/2013   PLT 234 11/30/2013     Chemistry      Component Value Date/Time   NA 138  05/18/2013 0949   NA 137 03/01/2013 1546   K 4.1 05/18/2013 0949   K 3.8 03/01/2013 1546   CL 101 05/18/2013 0949   CL 99 03/01/2013 1546   CO2 30 05/18/2013 0949   CO2 31 03/01/2013 1546   BUN 25* 05/18/2013 0949   BUN 19 03/01/2013 1546   CREATININE 1.2 05/18/2013 0949   CREATININE 1.5 03/01/2013 1546      Component Value Date/Time   CALCIUM 8.9 05/18/2013 0949   CALCIUM 9.1 03/01/2013 1546   ALKPHOS 72 03/01/2013 1546   AST 14 03/01/2013 1546   ALT 19 03/01/2013 1546   BILITOT 0.6 03/01/2013 1546         Impression and Plan: [name] is 68 year old gentleman with a left lower extremity thrombus. He also may have had a pulmonary embolism. We had had him on anticoagulation for 9 months. We will now stop. We'll put him on aspirin.  The diarrhea is a real issue. We will see how Flagyl does. I also told to take a pro-biotic.  He is to have oral surgery to remove all of his teeth. We will just have him stop his aspirin 7 days beforehand. He does not need to be on any anticoagulation to get him to surgery. He is not sure when surgery will be.  We we will get him back in another 4-6 weeks.   Volanda Napoleon, MD 6/18/20153:46 PM

## 2013-12-01 ENCOUNTER — Encounter: Payer: Self-pay | Admitting: *Deleted

## 2013-12-01 LAB — D-DIMER, QUANTITATIVE (NOT AT ARMC): D DIMER QUANT: 0.27 ug{FEU}/mL (ref 0.00–0.48)

## 2013-12-19 ENCOUNTER — Emergency Department (HOSPITAL_BASED_OUTPATIENT_CLINIC_OR_DEPARTMENT_OTHER): Payer: Medicare HMO

## 2013-12-19 ENCOUNTER — Encounter (HOSPITAL_BASED_OUTPATIENT_CLINIC_OR_DEPARTMENT_OTHER): Payer: Self-pay | Admitting: Emergency Medicine

## 2013-12-19 ENCOUNTER — Inpatient Hospital Stay (HOSPITAL_BASED_OUTPATIENT_CLINIC_OR_DEPARTMENT_OTHER)
Admission: EM | Admit: 2013-12-19 | Discharge: 2013-12-21 | DRG: 372 | Disposition: A | Discharge status: Home / Self Care | Payer: Medicare HMO | Attending: Internal Medicine | Admitting: Internal Medicine

## 2013-12-19 DIAGNOSIS — F329 Major depressive disorder, single episode, unspecified: Secondary | ICD-10-CM | POA: Diagnosis present

## 2013-12-19 DIAGNOSIS — K529 Noninfective gastroenteritis and colitis, unspecified: Secondary | ICD-10-CM | POA: Insufficient documentation

## 2013-12-19 DIAGNOSIS — N400 Enlarged prostate without lower urinary tract symptoms: Secondary | ICD-10-CM | POA: Diagnosis present

## 2013-12-19 DIAGNOSIS — G4733 Obstructive sleep apnea (adult) (pediatric): Secondary | ICD-10-CM | POA: Diagnosis present

## 2013-12-19 DIAGNOSIS — M169 Osteoarthritis of hip, unspecified: Secondary | ICD-10-CM

## 2013-12-19 DIAGNOSIS — Z86718 Personal history of other venous thrombosis and embolism: Secondary | ICD-10-CM

## 2013-12-19 DIAGNOSIS — Z87898 Personal history of other specified conditions: Secondary | ICD-10-CM

## 2013-12-19 DIAGNOSIS — I2699 Other pulmonary embolism without acute cor pulmonale: Secondary | ICD-10-CM

## 2013-12-19 DIAGNOSIS — Z8669 Personal history of other diseases of the nervous system and sense organs: Secondary | ICD-10-CM

## 2013-12-19 DIAGNOSIS — Z7982 Long term (current) use of aspirin: Secondary | ICD-10-CM

## 2013-12-19 DIAGNOSIS — M722 Plantar fascial fibromatosis: Secondary | ICD-10-CM

## 2013-12-19 DIAGNOSIS — Z86711 Personal history of pulmonary embolism: Secondary | ICD-10-CM | POA: Diagnosis present

## 2013-12-19 DIAGNOSIS — G4700 Insomnia, unspecified: Secondary | ICD-10-CM

## 2013-12-19 DIAGNOSIS — Z9889 Other specified postprocedural states: Secondary | ICD-10-CM

## 2013-12-19 DIAGNOSIS — IMO0002 Reserved for concepts with insufficient information to code with codable children: Secondary | ICD-10-CM

## 2013-12-19 DIAGNOSIS — Z5181 Encounter for therapeutic drug level monitoring: Secondary | ICD-10-CM

## 2013-12-19 DIAGNOSIS — N179 Acute kidney failure, unspecified: Secondary | ICD-10-CM | POA: Diagnosis present

## 2013-12-19 DIAGNOSIS — F32A Depression, unspecified: Secondary | ICD-10-CM | POA: Diagnosis present

## 2013-12-19 DIAGNOSIS — L0291 Cutaneous abscess, unspecified: Secondary | ICD-10-CM

## 2013-12-19 DIAGNOSIS — D72829 Elevated white blood cell count, unspecified: Secondary | ICD-10-CM | POA: Diagnosis present

## 2013-12-19 DIAGNOSIS — L0390 Cellulitis, unspecified: Secondary | ICD-10-CM

## 2013-12-19 DIAGNOSIS — Z87891 Personal history of nicotine dependence: Secondary | ICD-10-CM

## 2013-12-19 DIAGNOSIS — I82402 Acute embolism and thrombosis of unspecified deep veins of left lower extremity: Secondary | ICD-10-CM

## 2013-12-19 DIAGNOSIS — E860 Dehydration: Secondary | ICD-10-CM | POA: Diagnosis present

## 2013-12-19 DIAGNOSIS — A0472 Enterocolitis due to Clostridium difficile, not specified as recurrent: Principal | ICD-10-CM | POA: Diagnosis present

## 2013-12-19 DIAGNOSIS — M7552 Bursitis of left shoulder: Secondary | ICD-10-CM

## 2013-12-19 DIAGNOSIS — J449 Chronic obstructive pulmonary disease, unspecified: Secondary | ICD-10-CM | POA: Diagnosis present

## 2013-12-19 DIAGNOSIS — R030 Elevated blood-pressure reading, without diagnosis of hypertension: Secondary | ICD-10-CM

## 2013-12-19 DIAGNOSIS — F411 Generalized anxiety disorder: Secondary | ICD-10-CM

## 2013-12-19 DIAGNOSIS — M1610 Unilateral primary osteoarthritis, unspecified hip: Secondary | ICD-10-CM

## 2013-12-19 DIAGNOSIS — J4489 Other specified chronic obstructive pulmonary disease: Secondary | ICD-10-CM | POA: Diagnosis present

## 2013-12-19 DIAGNOSIS — M1710 Unilateral primary osteoarthritis, unspecified knee: Secondary | ICD-10-CM

## 2013-12-19 DIAGNOSIS — Z7901 Long term (current) use of anticoagulants: Secondary | ICD-10-CM

## 2013-12-19 DIAGNOSIS — N1832 Chronic kidney disease, stage 3b: Secondary | ICD-10-CM | POA: Diagnosis present

## 2013-12-19 DIAGNOSIS — Z6841 Body Mass Index (BMI) 40.0 and over, adult: Secondary | ICD-10-CM

## 2013-12-19 DIAGNOSIS — F3289 Other specified depressive episodes: Secondary | ICD-10-CM | POA: Diagnosis present

## 2013-12-19 DIAGNOSIS — Z833 Family history of diabetes mellitus: Secondary | ICD-10-CM

## 2013-12-19 DIAGNOSIS — I10 Essential (primary) hypertension: Secondary | ICD-10-CM | POA: Diagnosis present

## 2013-12-19 DIAGNOSIS — H409 Unspecified glaucoma: Secondary | ICD-10-CM

## 2013-12-19 DIAGNOSIS — E669 Obesity, unspecified: Secondary | ICD-10-CM | POA: Diagnosis present

## 2013-12-19 LAB — CBC WITH DIFFERENTIAL/PLATELET
Basophils Absolute: 0 10*3/uL (ref 0.0–0.1)
Basophils Relative: 0 % (ref 0–1)
EOS PCT: 0 % (ref 0–5)
Eosinophils Absolute: 0 10*3/uL (ref 0.0–0.7)
HCT: 50.4 % (ref 39.0–52.0)
Hemoglobin: 17 g/dL (ref 13.0–17.0)
Lymphocytes Relative: 8 % — ABNORMAL LOW (ref 12–46)
Lymphs Abs: 1.3 10*3/uL (ref 0.7–4.0)
MCH: 29.1 pg (ref 26.0–34.0)
MCHC: 33.7 g/dL (ref 30.0–36.0)
MCV: 86.2 fL (ref 78.0–100.0)
MONOS PCT: 8 % (ref 3–12)
Monocytes Absolute: 1.3 10*3/uL — ABNORMAL HIGH (ref 0.1–1.0)
NEUTROS PCT: 84 % — AB (ref 43–77)
Neutro Abs: 14.2 10*3/uL — ABNORMAL HIGH (ref 1.7–7.7)
PLATELETS: 231 10*3/uL (ref 150–400)
RBC: 5.85 MIL/uL — ABNORMAL HIGH (ref 4.22–5.81)
RDW: 17.5 % — ABNORMAL HIGH (ref 11.5–15.5)
WBC: 16.9 10*3/uL — ABNORMAL HIGH (ref 4.0–10.5)

## 2013-12-19 LAB — COMPREHENSIVE METABOLIC PANEL
ALT: 13 U/L (ref 0–53)
ANION GAP: 16 — AB (ref 5–15)
AST: 16 U/L (ref 0–37)
Albumin: 3 g/dL — ABNORMAL LOW (ref 3.5–5.2)
Alkaline Phosphatase: 67 U/L (ref 39–117)
BILIRUBIN TOTAL: 0.8 mg/dL (ref 0.3–1.2)
BUN: 31 mg/dL — AB (ref 6–23)
CO2: 25 mEq/L (ref 19–32)
CREATININE: 1.5 mg/dL — AB (ref 0.50–1.35)
Calcium: 9.2 mg/dL (ref 8.4–10.5)
Chloride: 97 mEq/L (ref 96–112)
GFR calc Af Amer: 53 mL/min — ABNORMAL LOW (ref >90)
GFR calc non Af Amer: 46 mL/min — ABNORMAL LOW (ref >90)
Glucose, Bld: 131 mg/dL — ABNORMAL HIGH (ref 70–99)
Potassium: 4 mEq/L (ref 3.7–5.3)
Sodium: 138 mEq/L (ref 137–147)
TOTAL PROTEIN: 6 g/dL (ref 6.0–8.3)

## 2013-12-19 LAB — PROTIME-INR
INR: 1.13 (ref 0.00–1.49)
PROTHROMBIN TIME: 14.5 s (ref 11.6–15.2)

## 2013-12-19 LAB — CLOSTRIDIUM DIFFICILE BY PCR: CDIFFPCR: POSITIVE — AB

## 2013-12-19 LAB — PRO B NATRIURETIC PEPTIDE: PRO B NATRI PEPTIDE: 46.4 pg/mL (ref 0–125)

## 2013-12-19 LAB — LIPASE, BLOOD: LIPASE: 12 U/L (ref 11–59)

## 2013-12-19 MED ORDER — SODIUM CHLORIDE 0.9 % IV SOLN: INTRAVENOUS | Status: DC

## 2013-12-19 MED ORDER — ASPIRIN 81 MG PO CHEW
162.0000 mg | CHEWABLE_TABLET | Freq: Every day | ORAL | Status: DC
  Administered 2013-12-19 – 2013-12-21 (×3): 162 mg via ORAL
  Filled 2013-12-19 (×3): qty 2

## 2013-12-19 MED ORDER — SODIUM CHLORIDE 0.9 % IV SOLN
INTRAVENOUS | Status: DC
  Administered 2013-12-19 – 2013-12-20 (×3): via INTRAVENOUS

## 2013-12-19 MED ORDER — ADULT MULTIVITAMIN W/MINERALS CH
1.0000 | ORAL_TABLET | Freq: Every day | ORAL | Status: DC
  Administered 2013-12-19 – 2013-12-21 (×3): 1 via ORAL
  Filled 2013-12-19 (×3): qty 1

## 2013-12-19 MED ORDER — VANCOMYCIN 50 MG/ML ORAL SOLUTION
125.0000 mg | Freq: Four times a day (QID) | ORAL | Status: DC
  Filled 2013-12-19 (×3): qty 2.5

## 2013-12-19 MED ORDER — THERA M PLUS PO TABS: 1.0000 | ORAL_TABLET | Freq: Every day | ORAL | Status: DC

## 2013-12-19 MED ORDER — ASPIRIN 81 MG PO TABS: 162.0000 mg | ORAL_TABLET | Freq: Every day | ORAL | Status: DC

## 2013-12-19 MED ORDER — GABAPENTIN 400 MG PO CAPS
400.0000 mg | ORAL_CAPSULE | Freq: Two times a day (BID) | ORAL | Status: DC
  Administered 2013-12-20 – 2013-12-21 (×3): 400 mg via ORAL
  Filled 2013-12-19 (×4): qty 1

## 2013-12-19 MED ORDER — BUPROPION HCL ER (SR) 150 MG PO TB12
150.0000 mg | ORAL_TABLET | Freq: Two times a day (BID) | ORAL | Status: DC
  Administered 2013-12-19 – 2013-12-21 (×4): 150 mg via ORAL
  Filled 2013-12-19 (×5): qty 1

## 2013-12-19 MED ORDER — ACETAMINOPHEN 325 MG PO TABS: 650.0000 mg | ORAL_TABLET | Freq: Four times a day (QID) | ORAL | Status: DC | PRN

## 2013-12-19 MED ORDER — NEOMYCIN-POLYMYXIN-HC 1 % OT SOLN: 3.0000 [drp] | Freq: Three times a day (TID) | OTIC | Status: DC | PRN

## 2013-12-19 MED ORDER — ONDANSETRON HCL 4 MG/2ML IJ SOLN
4.0000 mg | Freq: Four times a day (QID) | INTRAMUSCULAR | Status: DC | PRN
  Administered 2013-12-20: 4 mg via INTRAVENOUS
  Filled 2013-12-19: qty 2

## 2013-12-19 MED ORDER — ZOLPIDEM TARTRATE 5 MG PO TABS
5.0000 mg | ORAL_TABLET | Freq: Every day | ORAL | Status: DC
  Administered 2013-12-19 – 2013-12-20 (×2): 5 mg via ORAL
  Filled 2013-12-19 (×2): qty 1

## 2013-12-19 MED ORDER — IOHEXOL 300 MG/ML  SOLN
50.0000 mL | Freq: Once | INTRAMUSCULAR | Status: AC | PRN
  Administered 2013-12-19: 50 mL via ORAL

## 2013-12-19 MED ORDER — GABAPENTIN 300 MG PO CAPS: 300.0000 mg | ORAL_CAPSULE | Freq: Every day | ORAL | Status: DC

## 2013-12-19 MED ORDER — FINASTERIDE 5 MG PO TABS
5.0000 mg | ORAL_TABLET | Freq: Every day | ORAL | Status: DC
  Administered 2013-12-19 – 2013-12-20 (×2): 5 mg via ORAL
  Filled 2013-12-19 (×3): qty 1

## 2013-12-19 MED ORDER — SODIUM CHLORIDE 0.9 % IV BOLUS (SEPSIS)
250.0000 mL | Freq: Once | INTRAVENOUS | Status: AC
  Administered 2013-12-19: 250 mL via INTRAVENOUS

## 2013-12-19 MED ORDER — ONDANSETRON HCL 4 MG PO TABS: 4.0000 mg | ORAL_TABLET | Freq: Four times a day (QID) | ORAL | Status: DC | PRN

## 2013-12-19 MED ORDER — IOHEXOL 300 MG/ML  SOLN
100.0000 mL | Freq: Once | INTRAMUSCULAR | Status: AC | PRN
  Administered 2013-12-19: 80 mL via INTRAVENOUS

## 2013-12-19 MED ORDER — VANCOMYCIN 50 MG/ML ORAL SOLUTION
125.0000 mg | Freq: Four times a day (QID) | ORAL | Status: DC
  Administered 2013-12-19 – 2013-12-21 (×8): 125 mg via ORAL
  Filled 2013-12-19 (×10): qty 2.5

## 2013-12-19 MED ORDER — SACCHAROMYCES BOULARDII 250 MG PO CAPS
250.0000 mg | ORAL_CAPSULE | Freq: Two times a day (BID) | ORAL | Status: DC
  Administered 2013-12-19 – 2013-12-21 (×4): 250 mg via ORAL
  Filled 2013-12-19 (×5): qty 1

## 2013-12-19 MED ORDER — HEPARIN SODIUM (PORCINE) 5000 UNIT/ML IJ SOLN
5000.0000 [IU] | Freq: Three times a day (TID) | INTRAMUSCULAR | Status: DC
  Administered 2013-12-19 – 2013-12-21 (×5): 5000 [IU] via SUBCUTANEOUS
  Filled 2013-12-19 (×8): qty 1

## 2013-12-19 MED ORDER — ONDANSETRON HCL 4 MG/2ML IJ SOLN
4.0000 mg | Freq: Once | INTRAMUSCULAR | Status: AC
  Administered 2013-12-19: 4 mg via INTRAVENOUS
  Filled 2013-12-19: qty 2

## 2013-12-19 MED ORDER — MORPHINE SULFATE 2 MG/ML IJ SOLN: 2.0000 mg | INTRAMUSCULAR | Status: DC | PRN

## 2013-12-19 MED ORDER — ATENOLOL 12.5 MG HALF TABLET
12.5000 mg | ORAL_TABLET | Freq: Every day | ORAL | Status: DC
  Administered 2013-12-19 – 2013-12-21 (×3): 12.5 mg via ORAL
  Filled 2013-12-19 (×3): qty 1

## 2013-12-19 MED ORDER — ACETAMINOPHEN 650 MG RE SUPP: 650.0000 mg | Freq: Four times a day (QID) | RECTAL | Status: DC | PRN

## 2013-12-19 MED ORDER — TRAMADOL HCL 50 MG PO TABS
100.0000 mg | ORAL_TABLET | Freq: Three times a day (TID) | ORAL | Status: DC
  Administered 2013-12-19 – 2013-12-21 (×6): 100 mg via ORAL
  Filled 2013-12-19 (×6): qty 2

## 2013-12-19 NOTE — Progress Notes (Signed)
Took a call from Dr. Rogene Houston, accepted patient to [facility] floor, patient with 1 month long history of diarrhea, abdominal pain, poor po intake, CT with pancolitis and C diff positive. Coming for antibiotics and IV hydration.   Maleya Leever M. Cruzita Lederer, MD [facility] [phone]

## 2013-12-19 NOTE — ED Notes (Signed)
C/o n/v/d x 1 month. C/o weakness. States he has had blood clots in the past. Has been on antibiotic for diarrhea and then diarrhea got worse after finished antibiotic.  Pt states he is not eating well.

## 2013-12-19 NOTE — H&P (Addendum)
[facility] History and Physical  HERSCHEL FLEAGLE YQI:[id number] DOB: 02-28-1946 DOA: 12/19/2013  Referring physician: Dr Venita Sheffield PCP: No primary provider on file. patient planned to establish care  with  [facility]  ( saw Dr Veverly Fells in patient)  Chief Complaint:  Abdominal pain for 2 days Diarrhea off-and-on for 3 weeks  HPI:  68 year old obese male with history of hypertension, depression, DVT and PE completed anticoagulation recently who presented to [facility] with abdominal cramps and ongoing diarrhea for the past 3 weeks. Patient reports  being on a 2 weeks course of amoxicillin for a dental procedure about one month back. Shortly after completing the antibiotics he started having 2-3 episodes of diarrhea daily. He saw his hematologist Dr. Lucrezia Starch on June 18 as a followup for his DVT and PE and he was placed on oral Flagyl for a 7 day course along with. He reports his diarrhea to have subsided during that time but as soon as he completed the antibiotic he again started having watery diarrhea. For past 2 days he has been mild diffuse abdominal cramps. Since yesterday his diarrhea worsened symptoms almost every one hour. He reports having some chills today. Denies any nausea or vomiting. Denies any blood in the stool. He denies any sick contacts or recent travel. Reports that he and his wife eat mostly outside. Denies any similar symptoms in the past. Reports poor appetite for past 2 days. Patient denies headache, dizziness, fever, chills, nausea , vomiting, chest pain, palpitations, SOB,   urinary symptoms. Denies change in weight .  Course in the ED Patient's vitals were stable. Blood work done showed WBC of 16.9 K., hemoglobin of 17, hct of 50 and platelets of 231. Chemistry showed sodium of 138, k of 4, chloride 97, CO2 25, BUN of 31 creatinine 1.5. Glucose was 131. CT scan of the abdomen and pelvis done showed diffuse colitis without any obstruction. Stool for C. difficile sent  and was positive. Patient started on oral vancomycin and given findings of C. difficile colitis transferred to [facility] long for further management.   Review of Systems:  Constitutional: Denies fever,  diaphoresis,  and fatigue.  poor appetite and chills+ HEENT: Denieseye pain,  ear pain, congestion, sore throat, rhinorrhea, sneezing, mouth sores, trouble swallowing, neck pain,  Respiratory: Denies SOB, DOE, cough, chest tightness,  and wheezing.   Cardiovascular: Denies chest pain, palpitations and leg swelling.  Gastrointestinal: Abdominal pain, watery diarrhea, Denies nausea, vomiting, , constipation, blood in stool and abdominal distention.  denies blood in stool Genitourinary: Denies dysuria, urgency, frequency, hematuria, flank pain and difficulty urinating.  Endocrine: Denies: hot or cold intolerance,  polyuria, polydipsia. Musculoskeletal: Denies myalgias, back pain, joint swelling, arthralgias and gait problem.  Skin: Denies pallor, rash and wound.  Neurological: Denies dizziness, seizures, syncope, weakness, light-headedness, numbness and headaches.  Psychiatric/Behavioral: Denies confusion,    Past Medical History  Diagnosis Date  . Hypertension   . Depression   . Neuromuscular disorder   . Back pain   . Low back pain potentially associated with spinal stenosis   . Neuropathy of lower extremity     bilateral   Past Surgical History  Procedure Laterality Date  . Eye surgery    . Hernia repair     Social History:  reports that he quit smoking about 12 years ago. His smoking use included Cigarettes. He started smoking about 45 years ago. He has a 66 pack-year smoking history. He has never used smokeless tobacco. He  reports that he drinks alcohol. He reports that he does not use illicit drugs.  Allergies  Allergen Reactions  . Codeine Sulfate Itching and Nausea Only    Family History  Problem Relation Age of Onset  . Diabetes Mother   . Pneumonia Mother   . Alzheimer's  disease Mother     Prior to Admission medications   Medication Sig Start Date End Date Taking? Authorizing Provider  acetaminophen (TYLENOL) 500 MG tablet Take 2 tablets (1,000 mg total) by mouth every 8 (eight) hours as needed for pain. 02/21/13   Neena Rhymes, MD  atenolol (TENORMIN) 25 MG tablet Take 12.5 mg by mouth daily.    Historical Provider, MD  buPROPion (WELLBUTRIN SR) 150 MG 12 hr tablet Take 150 mg by mouth 2 (two) times daily.    Historical Provider, MD  finasteride (PROSCAR) 5 MG tablet Take 5 mg by mouth at bedtime.    Historical Provider, MD  gabapentin (NEURONTIN) 300 MG capsule Take 300 mg by mouth daily.    Historical Provider, MD  hydrochlorothiazide (HYDRODIURIL) 25 MG tablet Take 25 mg by mouth daily.    Historical Provider, MD  ketoconazole (NIZORAL) 2 % shampoo Apply topically daily.    Historical Provider, MD  Menthol-Methyl Salicylate (THERA-GESIC PLUS) CREA Apply 1 application topically at bedtime. Apply to lower back    Historical Provider, MD  metroNIDAZOLE (FLAGYL) 500 MG tablet Take 1 tablet (500 mg total) by mouth 3 (three) times daily. 11/30/13   Volanda Napoleon, MD  Multiple Vitamins-Minerals (MULTIVITAMINS THER. W/MINERALS) TABS Take 1 tablet by mouth daily.    Historical Provider, MD  NEOMYCIN-POLYMYXIN-HYDROCORTISONE (CORTISPORIN) 1 % SOLN otic solution Place 3 drops into both ears 3 (three) times daily as needed (for fungal infection).    Historical Provider, MD  traMADol (ULTRAM) 50 MG tablet Take 100 mg by mouth 3 (three) times daily. Takes 2 tabs    Historical Provider, MD  zolpidem (AMBIEN) 10 MG tablet Take 10 mg by mouth at bedtime. For sleep    Historical Provider, MD     Physical Exam:  Filed Vitals:   12/19/13 1352 12/19/13 1515 12/19/13 1612  BP: 128/56 126/68 127/87  Pulse: 94 89 100  Temp: 97.7 F (36.5 C) 98.3 F (36.8 C) 98.3 F (36.8 C)  TempSrc: Oral  Oral  Resp: 18 20 20   Height:   5\' 6"  (1.676 m)  Weight:   121.564 kg (268  lb)  SpO2: 97% 98% 97%    Constitutional: Vital signs reviewed.  Patient is an elderly obese male in no acute distress  HEENT: no pallor, no icterus, dry oral mucosa,  Cardiovascular: RRR, S1 normal, S2 normal, no MRG Chest: CTAB, no wheezes, rales, or rhonchi Abdominal: Soft. , non-distended, has a ventral hernia, bowel sounds are normal, mild tenderness over left lower quadrant no masses, organomegaly, or guarding present.  Ext: warm, no edema Neurological: A&O x3, non focal  Labs on Admission:  Basic Metabolic Panel:  Recent Labs Lab 12/19/13 1115  NA 138  K 4.0  CL 97  CO2 25  GLUCOSE 131*  BUN 31*  CREATININE 1.50*  CALCIUM 9.2   Liver Function Tests:  Recent Labs Lab 12/19/13 1115  AST 16  ALT 13  ALKPHOS 67  BILITOT 0.8  PROT 6.0  ALBUMIN 3.0*    Recent Labs Lab 12/19/13 1115  LIPASE 12   No results found for this basename: AMMONIA,  in the last 168 hours CBC:  Recent  Labs Lab 12/19/13 1115  WBC 16.9*  NEUTROABS 14.2*  HGB 17.0  HCT 50.4  MCV 86.2  PLT 231   Cardiac Enzymes: No results found for this basename: CKTOTAL, CKMB, CKMBINDEX, TROPONINI,  in the last 168 hours BNP: No components found with this basename: POCBNP,  CBG: No results found for this basename: GLUCAP,  in the last 168 hours  Radiological Exams on Admission: Dg Chest 2 View  12/19/2013   CLINICAL DATA:  Nausea vomiting and diarrhea for 1 month; recent antibiotic therapy.  EXAM: CHEST  2 VIEW  COMPARISON:  CT scan of the chest dated May 18, 2013.  FINDINGS: The lungs are mildly hyperinflated. There is no focal pneumonia. The heart and pulmonary vascularity are within the limits of normal. There is no pleural effusion. The bony thorax is unremarkable.  IMPRESSION: COPD.  There is no evidence of pneumonia nor CHF.   Electronically Signed   By: David  Martinique   On: 12/19/2013 14:03   Ct Abdomen Pelvis W Contrast  12/19/2013   CLINICAL DATA:  Diarrhea.  EXAM: CT ABDOMEN AND  PELVIS WITH CONTRAST  TECHNIQUE: Multidetector CT imaging of the abdomen and pelvis was performed using the standard protocol following bolus administration of intravenous contrast.  CONTRAST:  71mL OMNIPAQUE IOHEXOL 300 MG/ML SOLN, 66mL OMNIPAQUE IOHEXOL 300 MG/ML SOLN  COMPARISON:  CT 02/17/2013.  FINDINGS: No focal hepatic abnormality. Spleen normal. Pancreas normal. No biliary distention. Gallbladder nondistended.  Adrenals normal. Multiple bilateral renal cysts again noted and unchanged. No significant renal cortical abnormalities identified. No hydronephrosis. No obstructing ureteral stone. Bladder is nondistended. Mild prostate prominence. No pathologic pelvic fluid collections.  No significant adenopathy. Aortoiliac atherosclerotic vascular disease. No aneurysm. Visceral vessels are patent. Portal vein is patent.  Appendix unremarkable. Diffuse colonic wall thickening noted particularly in the right colon. These findings suggest colitis. Mild adjacent mesenteric fat stranding is present. No evidence of bowel obstruction or free air. Stomach is not distended. No mesenteric mass or hernia.  Lung bases are clear. Degenerative changes lumbar spine and both hips. No acute bony abnormality identified.  IMPRESSION: 1.  Diffuse colitis.  2. Bilateral renal simple cysts again noted.  3. Mild prostate prominence.   Electronically Signed   By: Marcello Moores  Register   On: 12/19/2013 14:06      Assessment/Plan Principal Problem:   C. difficile colitis Possibly in the setting of  antibiotic use for dental procedure. -Start on oral vancomycin given significant leukocytosis, diffuse colitis on CT scan and dehydration.. maintain contact precautions. We'll add florastar. Patient has diffuse colitis on CT scan of the abdomen but has minimal symptoms. We'll order when necessary morphine for severe pain. Continue home dose Tylenol and tramadol. -IV hydration with normal saline. -Monitor WBC.  Active Problems: Acute  kidney injury Secondary to dehydration. Hold HCTZ. Monitor with IV fluids  BPH Continue finasteride  History of PE and DVT Patient completed a course of Coumadin for left leg DVT and PE recently about 3 weeks back. A repeat Doppler of the lower extremity checked by his hematologist Dr. Marin Olp was negative. As for him patient possibly has protein S deficiency. He was started on aspirin 162 mg daily since his last visit which I would continue.   Depression Continue Wellbutrin  Obstructive sleep apnea Not on CPAP  Diet: full liquids  DVT prophylaxis: sq heparin    Code Status: full code Family Communication: discussed with wife over the phone Disposition Plan: home once improved  Danielys Madry  [facility] Pager [phone]  Total time spent on admission :70 minutes  If 7PM-7AM, please contact night-coverage [email or web address] Password [facility] 12/19/2013, 4:39 PM

## 2013-12-19 NOTE — ED Provider Notes (Addendum)
CSN: [id number]     Arrival date & time 12/19/13  1041 History   First MD Initiated Contact with Patient 12/19/13 1130     Chief Complaint  Patient presents with  . Diarrhea     (Consider location/radiation/quality/duration/timing/severity/associated sxs/prior Treatment) Patient is a 68 y.o. male presenting with diarrhea. The history is provided by the patient and the spouse.  Diarrhea Associated symptoms: abdominal pain and vomiting   Associated symptoms: no fever and no headaches    patient with a one-month history of nausea vomiting and diarrhea. On June 18 was treated by his hematology doctor with Flagyl and a pro antibiotic. Patient's symptoms have persisted. Associated with nausea not much vomiting diarrhea no blood in either one. Mild abdominal cramping no significant abdominal pain. Patient's primary care doctor used to be Dr. Evlyn Courier who is retired.  Past Medical History  Diagnosis Date  . Hypertension   . Depression   . Neuromuscular disorder   . Back pain   . Low back pain potentially associated with spinal stenosis   . Neuropathy of lower extremity     bilateral   Past Surgical History  Procedure Laterality Date  . Eye surgery    . Hernia repair     Family History  Problem Relation Age of Onset  . Diabetes Mother   . Pneumonia Mother   . Alzheimer's disease Mother    History  Substance Use Topics  . Smoking status: Former Smoker -- 2.00 packs/day for 33 years    Types: Cigarettes    Start date: 08/30/1968    Quit date: 07/02/2001  . Smokeless tobacco: Never Used     Comment: quit 12 years ago  . Alcohol Use: Yes     Comment: occasional    Review of Systems  Constitutional: Positive for fatigue. Negative for fever.  HENT: Negative for congestion.   Eyes: Negative for visual disturbance.  Respiratory: Negative for shortness of breath.   Cardiovascular: Negative for chest pain.  Gastrointestinal: Positive for nausea, vomiting, abdominal pain and  diarrhea.  Genitourinary: Negative for dysuria.  Musculoskeletal: Negative for back pain.  Skin: Negative for rash.  Neurological: Negative for headaches.  Hematological: Bruises/bleeds easily.  Psychiatric/Behavioral: Negative for confusion.      Allergies  Codeine sulfate  Home Medications   Prior to Admission medications   Medication Sig Start Date End Date Taking? Authorizing Provider  acetaminophen (TYLENOL) 500 MG tablet Take 2 tablets (1,000 mg total) by mouth every 8 (eight) hours as needed for pain. 02/21/13   Neena Rhymes, MD  atenolol (TENORMIN) 25 MG tablet Take 12.5 mg by mouth daily.    Historical Provider, MD  buPROPion (WELLBUTRIN SR) 150 MG 12 hr tablet Take 150 mg by mouth 2 (two) times daily.    Historical Provider, MD  finasteride (PROSCAR) 5 MG tablet Take 5 mg by mouth at bedtime.    Historical Provider, MD  gabapentin (NEURONTIN) 300 MG capsule Take 300 mg by mouth daily.    Historical Provider, MD  hydrochlorothiazide (HYDRODIURIL) 25 MG tablet Take 25 mg by mouth daily.    Historical Provider, MD  ketoconazole (NIZORAL) 2 % shampoo Apply topically daily.    Historical Provider, MD  Menthol-Methyl Salicylate (THERA-GESIC PLUS) CREA Apply 1 application topically at bedtime. Apply to lower back    Historical Provider, MD  metroNIDAZOLE (FLAGYL) 500 MG tablet Take 1 tablet (500 mg total) by mouth 3 (three) times daily. 11/30/13   Volanda Napoleon, MD  Multiple  Vitamins-Minerals (MULTIVITAMINS THER. W/MINERALS) TABS Take 1 tablet by mouth daily.    Historical Provider, MD  NEOMYCIN-POLYMYXIN-HYDROCORTISONE (CORTISPORIN) 1 % SOLN otic solution Place 3 drops into both ears 3 (three) times daily as needed (for fungal infection).    Historical Provider, MD  traMADol (ULTRAM) 50 MG tablet Take 100 mg by mouth 3 (three) times daily. Takes 2 tabs    Historical Provider, MD  zolpidem (AMBIEN) 10 MG tablet Take 10 mg by mouth at bedtime. For sleep    Historical Provider, MD    BP 128/56  Pulse 94  Temp(Src) 97.7 F (36.5 C) (Oral)  Resp 18  SpO2 97% Physical Exam  Nursing note and vitals reviewed. Constitutional: He is oriented to person, place, and time. He appears well-developed and well-nourished. No distress.  HENT:  Head: Normocephalic and atraumatic.  Mouth/Throat: Oropharynx is clear and moist.  Eyes: Conjunctivae and EOM are normal. Pupils are equal, round, and reactive to light.  Neck: Normal range of motion.  Cardiovascular: Normal rate, regular rhythm and normal heart sounds.   Pulmonary/Chest: Effort normal and breath sounds normal.  Abdominal: Soft. Bowel sounds are normal. There is no tenderness.  Musculoskeletal: Normal range of motion.  Neurological: He is alert and oriented to person, place, and time. No cranial nerve deficit. He exhibits normal muscle tone. Coordination normal.  Skin: Skin is warm. No erythema.    ED Course  Procedures (including critical care time) Labs Review Labs Reviewed  COMPREHENSIVE METABOLIC PANEL - Abnormal; Notable for the following:    Glucose, Bld 131 (*)    BUN 31 (*)    Creatinine, Ser 1.50 (*)    Albumin 3.0 (*)    GFR calc non Af Amer 46 (*)    GFR calc Af Amer 53 (*)    Anion gap 16 (*)    All other components within normal limits  CBC WITH DIFFERENTIAL - Abnormal; Notable for the following:    WBC 16.9 (*)    RBC 5.85 (*)    RDW 17.5 (*)    Neutrophils Relative % 84 (*)    Neutro Abs 14.2 (*)    Lymphocytes Relative 8 (*)    Monocytes Absolute 1.3 (*)    All other components within normal limits  STOOL CULTURE  CLOSTRIDIUM DIFFICILE BY PCR  LIPASE, BLOOD  PRO B NATRIURETIC PEPTIDE  PROTIME-INR   Results for orders placed during the hospital encounter of 12/19/13  COMPREHENSIVE METABOLIC PANEL      Result Value Ref Range   Sodium 138  137 - 147 mEq/L   Potassium 4.0  3.7 - 5.3 mEq/L   Chloride 97  96 - 112 mEq/L   CO2 25  19 - 32 mEq/L   Glucose, Bld 131 (*) 70 - 99 mg/dL    BUN 31 (*) 6 - 23 mg/dL   Creatinine, Ser 1.50 (*) 0.50 - 1.35 mg/dL   Calcium 9.2  8.4 - 10.5 mg/dL   Total Protein 6.0  6.0 - 8.3 g/dL   Albumin 3.0 (*) 3.5 - 5.2 g/dL   AST 16  0 - 37 U/L   ALT 13  0 - 53 U/L   Alkaline Phosphatase 67  39 - 117 U/L   Total Bilirubin 0.8  0.3 - 1.2 mg/dL   GFR calc non Af Amer 46 (*) >90 mL/min   GFR calc Af Amer 53 (*) >90 mL/min   Anion gap 16 (*) 5 - 15  LIPASE, BLOOD  Result Value Ref Range   Lipase 12  11 - 59 U/L  CBC WITH DIFFERENTIAL      Result Value Ref Range   WBC 16.9 (*) 4.0 - 10.5 K/uL   RBC 5.85 (*) 4.22 - 5.81 MIL/uL   Hemoglobin 17.0  13.0 - 17.0 g/dL   HCT 50.4  39.0 - 52.0 %   MCV 86.2  78.0 - 100.0 fL   MCH 29.1  26.0 - 34.0 pg   MCHC 33.7  30.0 - 36.0 g/dL   RDW 17.5 (*) 11.5 - 15.5 %   Platelets 231  150 - 400 K/uL   Neutrophils Relative % 84 (*) 43 - 77 %   Neutro Abs 14.2 (*) 1.7 - 7.7 K/uL   Lymphocytes Relative 8 (*) 12 - 46 %   Lymphs Abs 1.3  0.7 - 4.0 K/uL   Monocytes Relative 8  3 - 12 %   Monocytes Absolute 1.3 (*) 0.1 - 1.0 K/uL   Eosinophils Relative 0  0 - 5 %   Eosinophils Absolute 0.0  0.0 - 0.7 K/uL   Basophils Relative 0  0 - 1 %   Basophils Absolute 0.0  0.0 - 0.1 K/uL  PRO B NATRIURETIC PEPTIDE      Result Value Ref Range   Pro B Natriuretic peptide (BNP) 46.4  0 - 125 pg/mL  PROTIME-INR      Result Value Ref Range   Prothrombin Time 14.5  11.6 - 15.2 seconds   INR 1.13  0.00 - 1.49     Imaging Review Dg Chest 2 View  12/19/2013   CLINICAL DATA:  Nausea vomiting and diarrhea for 1 month; recent antibiotic therapy.  EXAM: CHEST  2 VIEW  COMPARISON:  CT scan of the chest dated May 18, 2013.  FINDINGS: The lungs are mildly hyperinflated. There is no focal pneumonia. The heart and pulmonary vascularity are within the limits of normal. There is no pleural effusion. The bony thorax is unremarkable.  IMPRESSION: COPD.  There is no evidence of pneumonia nor CHF.   Electronically Signed   By:  David  Martinique   On: 12/19/2013 14:03   Ct Abdomen Pelvis W Contrast  12/19/2013   CLINICAL DATA:  Diarrhea.  EXAM: CT ABDOMEN AND PELVIS WITH CONTRAST  TECHNIQUE: Multidetector CT imaging of the abdomen and pelvis was performed using the standard protocol following bolus administration of intravenous contrast.  CONTRAST:  74mL OMNIPAQUE IOHEXOL 300 MG/ML SOLN, 62mL OMNIPAQUE IOHEXOL 300 MG/ML SOLN  COMPARISON:  CT 02/17/2013.  FINDINGS: No focal hepatic abnormality. Spleen normal. Pancreas normal. No biliary distention. Gallbladder nondistended.  Adrenals normal. Multiple bilateral renal cysts again noted and unchanged. No significant renal cortical abnormalities identified. No hydronephrosis. No obstructing ureteral stone. Bladder is nondistended. Mild prostate prominence. No pathologic pelvic fluid collections.  No significant adenopathy. Aortoiliac atherosclerotic vascular disease. No aneurysm. Visceral vessels are patent. Portal vein is patent.  Appendix unremarkable. Diffuse colonic wall thickening noted particularly in the right colon. These findings suggest colitis. Mild adjacent mesenteric fat stranding is present. No evidence of bowel obstruction or free air. Stomach is not distended. No mesenteric mass or hernia.  Lung bases are clear. Degenerative changes lumbar spine and both hips. No acute bony abnormality identified.  IMPRESSION: 1.  Diffuse colitis.  2. Bilateral renal simple cysts again noted.  3. Mild prostate prominence.   Electronically Signed   By: Marcello Moores  Register   On: 12/19/2013 14:06     EKG Interpretation  None      MDM   Final diagnoses:  Colitis    Patient now with a one-month history of diarrhea with some nausea and vomiting. Increasing weakness. May very well have C. difficile. Patient was on amoxicillin regular Dr. started him empirically on Flagyl and Cipro antibiotic on June 18. Symptoms have persisted. Marked leukocytosis. Patient vital sign wise not toxic but will  require admission. C. difficile sent stool cultures pending we'll discuss with hospitalist patient followed by [facility] internal medicine in the past.    Fredia Sorrow, MD 12/19/13 1449  The patient will be transferred to [facility] [place] service.. Patient's C. difficile did come back positive. Based on that'll be started on oral vancomycin.  Fredia Sorrow, MD 12/19/13 [phone]

## 2013-12-19 NOTE — ED Notes (Signed)
PTAR at bedside preparing to transfer.

## 2013-12-19 NOTE — Discharge Instructions (Signed)
Clostridium Difficile FAQs °What is Clostridium difficile infection?  °Clostridium difficile [pronounced Klo-STRID-ee-um dif-uh-SEEL], also known as "C. diff" [See-dif], is a germ that can cause diarrhea. Most cases of C. diff infection occur in patients taking antibiotics. The most common symptoms of a C. diff infection include: °· Watery diarrhea °· Fever °· Loss of appetite °· Nausea °· Belly pain and tenderness °Who is most likely to get C. diff infection? °The elderly and people with certain medical problems have the greatest chance of getting C. diff. C. diff spores can live outside the human body for a very long time and may be found on things in the environment such as bed linens, bed rails, bathroom fixtures, and medical equipment. C. diff infection can spread from person-to-person on contaminated equipment and on the hands of doctors, nurses, other healthcare providers and visitors. °Can C. diff infection be treated? °Yes, there are antibiotics that can be used to treat C. diff. In some severe cases, a person might have to have surgery to remove the infected part of the intestines. This surgery is needed in only 1 or 2 out of every 100 persons with C. diff. °What are some of the things that hospitals are doing to prevent C. diff infections? °To prevent C. diff infections, doctors, nurses, and other healthcare providers: °· Clean their hands with soap and water or an alcohol-based hand rub before and after caring for every patient. This can prevent C. diff and other germs from being passed from one patient to another on their hands. °· Carefully clean hospital rooms and medical equipment that have been used for patients with C. diff. °· Use Contact Precautions to prevent C. diff from spreading to other patients. Contact Precautions mean: °¨ Whenever possible, patients with C. diff will have a single room or share a room only with someone else who also has C. diff. °¨ Healthcare providers will put on gloves  and wear a gown over their clothing while taking care of patients with C. diff. °¨ Visitors may also be asked to wear a gown and gloves. °¨ When leaving the room, hospital providers and visitors remove their gown and gloves and clean their hands. °¨ Patients on Contact Precautions are asked to stay in their hospital rooms as much as possible. They should not go to common areas, such as the gift shop or cafeteria. They can go to other areas of the hospital for treatments and tests. °· Only give patients antibiotics when it is necessary. °What can I do to help prevent C. diff infections? °· Make sure that all doctors, nurses, and other healthcare providers clean their hands with soap and water or an alcohol-based hand rub before and after caring for you. °· If you do not see your providers clean their hands, please ask them to do so. °· Only take antibiotics as prescribed by your doctor. °· Be sure to clean your own hands often, especially after using the bathroom and before eating. °Can my friends and family get C. diff when they visit me? °C. diff infection usually does not occur in persons who are not taking antibiotics. Visitors are not likely to get C. diff. Still, to make it safer for visitors, they should: °· Clean their hands before they enter your room and as they leave your room °· Ask the nurse if they need to wear protective gowns and gloves when they visit you. °What do I need to do when I go home from the hospital? °Once you   are back at home, you can return to your normal routine. Often, the diarrhea will be better or completely gone before you go home. This makes giving C. diff to other people much less likely. There are a few things you should do, however, to lower the chances of developing C. diff infection again or of spreading it to others.  If you are given a prescription to treat C. diff, take the medicine exactly as prescribed by your doctor and pharmacist. Do not take half-doses or stop before  you run out.  Wash your hands often, especially after going to the bathroom and before preparing food.  People who live with you should wash their hands often as well.  If you develop more diarrhea after you get home, tell your doctor immediately.  Your doctor may give you additional instructions. If you have questions, please ask your doctor or nurse. Developed and co-sponsored by [employer] for [place] [phone]); Infectious Diseases Society of [place] (IDSA); [place]; Association for Professionals in Infection Control and Epidemiology (APIC); Centers for Disease Control and Prevention (CDC); and [employer]. Document Released: 06/06/2013 Document Reviewed: 05/15/2013 [facility] Patient Information 2015 [employer]. This information is not intended to replace advice given to you by your health care provider. Make sure you discuss any questions you have with your health care provider. Clostridium Difficile Infection Clostridium difficile (C. difficile) is a germ found in the intestines. C. difficile infection can occur after taking some medicines. C. difficile infection can cause watery poop (diarrhea) or severe disease. HOME CARE   Drink enough fluids to keep your pee (urine) clear or pale yellow. Avoid milk, caffeine, and alcohol.  Ask your doctor how to replace body fluid losses (rehydrate).  Eat small meals more often rather than large meals.  Take your medicine (antibiotics) as told. Finish it even if you start to feel better.  Do not  use medicines to slow the watery poop.  Wash your hands well after using the bathroom and before preparing food.  Make sure people who live with you wash their hands often.  Clean all surfaces. Use a product that contains chlorine bleach. GET HELP RIGHT AWAY IF:   The watery poop does not stop, or it comes back after you finish your medicine.  You feel very dry or thirsty  (dehydrated).  You have a fever.  You have more belly (abdominal) pain or tenderness.  There is blood in your poop (stool), or your poop is black and tar-like.  You cannot eat food or drink liquids without throwing up (vomiting). MAKE SURE YOU:   Understand these instructions.  Will watch your condition.  Will get help right away if you are not doing well or get worse. Document Released: 03/29/2009 Document Revised: 09/26/2012 Document Reviewed: 11/07/2010 [facility] Patient Information 2015 [place], [place]. This information is not intended to replace advice given to you by your health care provider. Make sure you discuss any questions you have with your health care provider.

## 2013-12-20 DIAGNOSIS — K5289 Other specified noninfective gastroenteritis and colitis: Secondary | ICD-10-CM

## 2013-12-20 LAB — BASIC METABOLIC PANEL
ANION GAP: 12 (ref 5–15)
BUN: 28 mg/dL — ABNORMAL HIGH (ref 6–23)
CO2: 25 mEq/L (ref 19–32)
CREATININE: 1.27 mg/dL (ref 0.50–1.35)
Calcium: 8.4 mg/dL (ref 8.4–10.5)
Chloride: 98 mEq/L (ref 96–112)
GFR calc Af Amer: 65 mL/min — ABNORMAL LOW (ref >90)
GFR, EST NON AFRICAN AMERICAN: 56 mL/min — AB (ref >90)
Glucose, Bld: 104 mg/dL — ABNORMAL HIGH (ref 70–99)
Potassium: 3.9 mEq/L (ref 3.7–5.3)
SODIUM: 135 meq/L — AB (ref 137–147)

## 2013-12-20 LAB — CBC
HCT: 44.6 % (ref 39.0–52.0)
Hemoglobin: 14.5 g/dL (ref 13.0–17.0)
MCH: 28.2 pg (ref 26.0–34.0)
MCHC: 32.5 g/dL (ref 30.0–36.0)
MCV: 86.6 fL (ref 78.0–100.0)
PLATELETS: 210 10*3/uL (ref 150–400)
RBC: 5.15 MIL/uL (ref 4.22–5.81)
RDW: 15.7 % — AB (ref 11.5–15.5)
WBC: 14.4 10*3/uL — ABNORMAL HIGH (ref 4.0–10.5)

## 2013-12-20 NOTE — Care Management Note (Signed)
    Page 1 of 1   12/20/2013     11:43:31 AM CARE MANAGEMENT NOTE 12/20/2013  Patient:  Tanner [name], Tanner [name]   Account Number:  [id number]  Date Initiated:  12/20/2013  Documentation initiated by:  [facility]  Subjective/Objective Assessment:   adm: abdominal pain; C. Difficile colitis     Action/Plan:   from home with wife   Anticipated DC Date:  12/23/2013   Anticipated DC Plan:  HOME/SELF CARE         Choice offered to / List presented to:             Status of service:  Completed, signed off Medicare Important Message given?   (If response is "NO", the following Medicare IM given date fields will be blank) Date Medicare IM given:   Medicare IM given by:   Date Additional Medicare IM given:   Additional Medicare IM given by:    Discharge Disposition:  HOME/SELF CARE  Per UR Regulation:  Reviewed for med. necessity/level of care/duration of stay  If discussed at [place] of Stay Meetings, dates discussed:    Comments:  12/20/13 11:40 CM reviewed.  Mariane Masters, BSN, CM [phone].

## 2013-12-20 NOTE — Progress Notes (Signed)
Patient ID: Tanner [name], male   DOB: [birth year], 68 y.o.   MRN: [id number]  [facility] PROGRESS NOTE  HEATHER STREEPER NTI:[id number] DOB: September 11, 1945 DOA: 12/19/2013 PCP: No primary provider on file.  Brief narrative: 68 year old obese male with history of hypertension, depression, DVT and PE completed anticoagulation recently who [facility] with abdominal cramps and ongoing diarrhea for the past 3 weeks. Patient reported being on a 2 weeks course of amoxicillin for a dental procedure about one month back. Shortly after completing the antibiotics he started having 2-3 episodes of diarrhea daily. He saw his hematologist Dr. Lucrezia Starch on June 18th as a followup for his DVT and PE and he was placed on oral Flagyl for a 7 day course. He reports his diarrhea to have subsided during that time but as soon as he completed the antibiotic he again started having watery diarrhea.  Principal Problem:   C. difficile colitis - pt reports clinically improving and able to tolerate current diet, full liquid  - continue oral vancomycin day #2/14 - continue to provide supportive care, advance diet to regular  Active Problems:   Leukocytosis - from C. Dif colitis - ABX as noted above - WBC is trending down, repeat CBC in AM   DEPRESSION - clinically stable    OBSTRUCTIVE SLEEP APNEA - stable, pt maintaining oxygen saturation at target range    BENIGN PROSTATIC HYPERTROPHY - good urine output    History of pulmonary embolus (PE) - recently completed AC therapy    Acute kidney failure - secondary to pre renal etiology - IVF provided and pt responded well, Cr is now stable and WNL - repeat BMP in AM  Consultants:  None Procedures/Studies: Dg Chest 2 View  12/19/2013  COPD.  There is no evidence of pneumonia nor CHF.   Ct Abdomen Pelvis W Contrast  12/19/2013  Diffuse colitis.  Bilateral renal simple cysts again noted.  Mild prostate prominence.   Antibiotics:  Vancomycin 7/7  -->   Code Status: Full Family Communication: Pt at bedside Disposition Plan: Home when medically stable  HPI/Subjective: No events overnight.   Objective: Filed Vitals:   12/19/13 1515 12/19/13 1612 12/19/13 2252 12/20/13 0530  BP: 126/68 127/87 125/75 129/68  Pulse: 89 100 74 69  Temp: 98.3 F (36.8 C) 98.3 F (36.8 C) 98.1 F (36.7 C) 98.1 F (36.7 C)  TempSrc:  Oral Oral Oral  Resp: 20 20 20 20   Height:  5\' 6"  (1.676 m)    Weight:  121.564 kg (268 lb)    SpO2: 98% 97% 95% 93%    Intake/Output Summary (Last 24 hours) at 12/20/13 1510 Last data filed at 12/20/13 0947  Gross per 24 hour  Intake 2288.33 ml  Output      0 ml  Net 2288.33 ml    Exam:   General:  Pt is alert, follows commands appropriately, not in acute distress  Cardiovascular: Regular rate and rhythm, no rubs, no gallops  Respiratory: Clear to auscultation bilaterally, no wheezing, no crackles, no rhonchi  Abdomen: Soft, non tender, non distended, bowel sounds present, no guarding  Extremities: pulses DP and PT palpable bilaterally  Neuro: Grossly nonfocal  Data Reviewed: Basic Metabolic Panel:  Recent Labs Lab 12/19/13 1115 12/20/13 0418  NA 138 135*  K 4.0 3.9  CL 97 98  CO2 25 25  GLUCOSE 131* 104*  BUN 31* 28*  CREATININE 1.50* 1.27  CALCIUM 9.2 8.4   Liver Function  Tests:  Recent Labs Lab 12/19/13 1115  AST 16  ALT 13  ALKPHOS 67  BILITOT 0.8  PROT 6.0  ALBUMIN 3.0*    Recent Labs Lab 12/19/13 1115  LIPASE 12   CBC:  Recent Labs Lab 12/19/13 1115 12/20/13 0418  WBC 16.9* 14.4*  NEUTROABS 14.2*  --   HGB 17.0 14.5  HCT 50.4 44.6  MCV 86.2 86.6  PLT 231 210   Recent Results (from the past 240 hour(s))  STOOL CULTURE     Status: None   Collection Time    12/19/13 12:22 PM      Result Value Ref Range Status   Specimen Description STOOL   Final   Special Requests NONE   Final   Culture     Final   Value: Culture reincubated for better growth      Performed at [facility]   Report Status PENDING   Incomplete  CLOSTRIDIUM DIFFICILE BY PCR     Status: Abnormal   Collection Time    12/19/13 12:22 PM      Result Value Ref Range Status   C difficile by pcr POSITIVE (*) NEGATIVE Final   Comment: CRITICAL RESULT CALLED TO, READ BACK BY AND VERIFIED WITH:     A. BURNS RN 14:55 12/19/13 ([email or web address])     Performed at [facility]     Scheduled Meds: . aspirin  162 mg Oral Daily  . atenolol  12.5 mg Oral Daily  . buPROPion  150 mg Oral BID  . finasteride  5 mg Oral QHS  . gabapentin  400 mg Oral BID  . heparin  5,000 Units Subcutaneous 3 times per day  . saccharomyces boulardii  250 mg Oral BID  . traMADol  100 mg Oral TID  . vancomycin  125 mg Oral QID  . zolpidem  5 mg Oral QHS   Continuous Infusions: . sodium chloride 100 mL/hr at 12/20/13 0224    Faye Ramsay, MD  [facility] Pager [phone]  If 7PM-7AM, please contact night-coverage [email or web address] Password [email or web address] 12/20/2013, 3:10 PM   LOS: 1 day

## 2013-12-20 NOTE — Progress Notes (Signed)
INITIAL NUTRITION ASSESSMENT  DOCUMENTATION CODES Per approved criteria  -Morbid Obesity   INTERVENTION: -Encouraged intake of fluids for hydration -Encouraged intake of heart healthy proteins and balanced meals as tolerated -Promoted use of yogurt for intestinal health -Diet advancement per MD -Will continue to monitor  NUTRITION DIAGNOSIS: Inadequate oral intake related to nausea/abd pain as evidenced by decreased PO intake for one month, 4.3% body weight loss in one month.   Goal: Pt to meet >/= 90% of their estimated nutrition needs    Monitor:  Total protein/energy intake, labs, GI profile, labs, weights  Reason for Assessment: MST  68 y.o. male  Admitting Dx: C. difficile colitis  ASSESSMENT: 68 year old obese male with history of hypertension, depression, DVT and PE completed anticoagulation recently who [facility] with abdominal cramps and ongoing diarrhea for the past 3 weeks. Patient reports being on a 2 weeks course of amoxicillin for a dental procedure about one month back.  -Pt reported decreased appetite for past one month d/t nausea/abd pain and loose stools -Diet recall indicates pt consuming fruits, and soups. Wife had tried to encourage Boost supplements, however this exacerbated pt's nausea and discontinued regimen -Endorsed a 30 lbs unintentional wt loss, which appears to have been in past 8 months since 04/2013 (10% body weight loss, non-significant for time frame). Has lost 10-12 lbs (out of 30) in past one month, most likely r/t to C.diff related nutrition side effects. Dental surgery has also contributed to decreased appetite/PO intake as he had a slow recovery with food tolerance -Is tolerating full liquid diet, encouraged fluids and heart healthy protein sources (milk/yogurt) while on liquid diet. Discussed foods that contain binding fiber to assist with stools. Pt noted that appetite was improving with improvement in GI symptoms.  Declined supplements as he does not like taste  Height: Ht Readings from Last 1 Encounters:  12/19/13 5\' 6"  (1.676 m)    Weight: Wt Readings from Last 1 Encounters:  12/19/13 268 lb (121.564 kg)    Ideal Body Weight: 142 lbs  % Ideal Body Weight: 189%  Wt Readings from Last 10 Encounters:  12/19/13 268 lb (121.564 kg)  11/30/13 278 lb (126.1 kg)  11/29/13 280 lb (127.007 kg)  08/31/13 298 lb (135.172 kg)  06/01/13 290 lb (131.543 kg)  05/10/13 297 lb (134.718 kg)  05/08/13 293 lb (132.904 kg)  04/11/13 285 lb (129.275 kg)  03/13/13 286 lb (129.729 kg)  03/01/13 285 lb 9.6 oz (129.547 kg)    Usual Body Weight:298%  % Usual Body Weight:90%  BMI:  Body mass index is 43.28 kg/(m^2). Morbid Obesity  Estimated Nutritional Needs: Kcal: 1700-1800 Protein: 110-120 Fluid: >/=1800 ml/daily  Skin: WDL  Diet Order: Full Liquid  EDUCATION NEEDS: -Education needs addressed   Intake/Output Summary (Last 24 hours) at 12/20/13 1101 Last data filed at 12/20/13 0947  Gross per 24 hour  Intake 2688.33 ml  Output      0 ml  Net 2688.33 ml    Last BM: 7/07   Labs:   Recent Labs Lab 12/19/13 1115 12/20/13 0418  NA 138 135*  K 4.0 3.9  CL 97 98  CO2 25 25  BUN 31* 28*  CREATININE 1.50* 1.27  CALCIUM 9.2 8.4  GLUCOSE 131* 104*    CBG (last 3)  No results found for this basename: GLUCAP,  in the last 72 hours  Scheduled Meds: . aspirin  162 mg Oral Daily  . atenolol  12.5 mg Oral  Daily  . buPROPion  150 mg Oral BID  . finasteride  5 mg Oral QHS  . gabapentin  400 mg Oral BID  . heparin  5,000 Units Subcutaneous 3 times per day  . multivitamin with minerals  1 tablet Oral Daily  . saccharomyces boulardii  250 mg Oral BID  . traMADol  100 mg Oral TID  . vancomycin  125 mg Oral QID  . zolpidem  5 mg Oral QHS    Continuous Infusions: . sodium chloride 100 mL/hr at 12/20/13 4782    Past Medical History  Diagnosis Date  . Hypertension   . Depression    . Neuromuscular disorder   . Back pain   . Low back pain potentially associated with spinal stenosis   . Neuropathy of lower extremity     bilateral    Past Surgical History  Procedure Laterality Date  . Eye surgery    . Hernia repair      Atlee Abide MS RD LDN Clinical Dietitian [phone]

## 2013-12-21 LAB — BASIC METABOLIC PANEL
Anion gap: 11 (ref 5–15)
BUN: 21 mg/dL (ref 6–23)
CHLORIDE: 99 meq/L (ref 96–112)
CO2: 25 mEq/L (ref 19–32)
CREATININE: 1.1 mg/dL (ref 0.50–1.35)
Calcium: 8.2 mg/dL — ABNORMAL LOW (ref 8.4–10.5)
GFR calc Af Amer: 77 mL/min — ABNORMAL LOW (ref >90)
GFR, EST NON AFRICAN AMERICAN: 67 mL/min — AB (ref >90)
Glucose, Bld: 88 mg/dL (ref 70–99)
POTASSIUM: 3.6 meq/L — AB (ref 3.7–5.3)
Sodium: 135 mEq/L — ABNORMAL LOW (ref 137–147)

## 2013-12-21 LAB — CBC
HEMATOCRIT: 40.6 % (ref 39.0–52.0)
Hemoglobin: 13.3 g/dL (ref 13.0–17.0)
MCH: 28.4 pg (ref 26.0–34.0)
MCHC: 32.8 g/dL (ref 30.0–36.0)
MCV: 86.8 fL (ref 78.0–100.0)
PLATELETS: 196 10*3/uL (ref 150–400)
RBC: 4.68 MIL/uL (ref 4.22–5.81)
RDW: 15.6 % — ABNORMAL HIGH (ref 11.5–15.5)
WBC: 10.9 10*3/uL — AB (ref 4.0–10.5)

## 2013-12-21 MED ORDER — POTASSIUM CHLORIDE CRYS ER 20 MEQ PO TBCR: 20.0000 meq | EXTENDED_RELEASE_TABLET | Freq: Every day | ORAL | Status: DC

## 2013-12-21 MED ORDER — VANCOMYCIN 50 MG/ML ORAL SOLUTION: 125.0000 mg | Freq: Four times a day (QID) | ORAL | Status: DC

## 2013-12-21 MED ORDER — POTASSIUM CHLORIDE CRYS ER 20 MEQ PO TBCR
40.0000 meq | EXTENDED_RELEASE_TABLET | Freq: Once | ORAL | Status: AC
  Administered 2013-12-21: 40 meq via ORAL
  Filled 2013-12-21: qty 2

## 2013-12-21 MED ORDER — ZOLPIDEM TARTRATE 10 MG PO TABS: 10.0000 mg | ORAL_TABLET | Freq: Every day | ORAL | Status: DC

## 2013-12-21 MED ORDER — SACCHAROMYCES BOULARDII 250 MG PO CAPS: 250.0000 mg | ORAL_CAPSULE | Freq: Two times a day (BID) | ORAL | Status: DC

## 2013-12-21 NOTE — Progress Notes (Signed)
Patient's d/c instructions rendered. Prescriptions given to patient. Teach back used to check patient knowledge on his meds. to be taken at home, verbalized understanding. Alert and orientedx4. Ambulated with PT. Has fair appetite. Stable.Sandie Ano RN

## 2013-12-21 NOTE — Evaluation (Signed)
Physical Therapy One Time Evaluation Patient Details Name: Tanner [name] MRN: [id number] DOB: 18-Nov-1945 Today's Date: 12/21/2013   History of Present Illness  68 year old obese male with history of hypertension, depression, DVT and PE completed anticoagulation recently admitted 12/19/13 with abdominal cramps and ongoing diarrhea for the past 3 weeks and diagnosed with c-diff.  Clinical Impression  Patient evaluated by Physical Therapy with no further acute PT needs identified. All education has been completed and the patient has no further questions.Pt able to ambulate around room modified independent and supervision level for longer distances as pt with occasional LOB however able to self correct and states he does not like ambulating in socks (used to always wearing his shoes) as well as this is first time ambulating since admission.  Pt reports feeling better and stronger and states spouse can assist at home.  Pt with LOB upon marching (also able to self correct) (mimicking steps) so recommended assist with steps upon return home for safety. See below for any follow-up Physial Therapy or equipment needs. PT is signing off. Thank you for this referral.     Follow Up Recommendations No PT follow up    Equipment Recommendations  None recommended by PT    Recommendations for Other Services       Precautions / Restrictions Precautions Precautions: Fall      Mobility  Bed Mobility               General bed mobility comments: pt up in recliner upon arrival  Transfers Overall transfer level: Needs assistance Equipment used: None Transfers: Sit to/from Stand Sit to Stand: Supervision;Modified independent (Device/Increase time)            Ambulation/Gait Ambulation/Gait assistance: Supervision Ambulation Distance (Feet): 400 Feet Assistive device: None Gait Pattern/deviations: Step-through pattern;Decreased stride length     General Gait Details: 1-2 instances of LOB  however pt able to self correct (looking downward and toward side) however pt able to look right and left for 30 feet without difficulty as well as walk backwards and start/stop without difficulty  Stairs            Wheelchair Mobility    Modified Rankin (Stroke Patients Only)       Balance Overall balance assessment: History of Falls (reports only fall was Sunday prior to admission)                                           Pertinent Vitals/Pain No pain reported    Home Living Family/patient expects to be discharged to:: Private residence Living Arrangements: Spouse/significant other   Type of Home: House Home Access: Stairs to enter   Technical brewer of Steps: 2-3 Home Layout: One level Home Equipment: None      Prior Function Level of Independence: Independent               Hand Dominance        Extremity/Trunk Assessment   Upper Extremity Assessment: Overall WFL for tasks assessed           Lower Extremity Assessment: Overall WFL for tasks assessed         Communication   Communication: No difficulties  Cognition Arousal/Alertness: Awake/alert Behavior During Therapy: WFL for tasks assessed/performed Overall Cognitive Status: Within Functional Limits for tasks assessed  General Comments      Exercises        Assessment/Plan    PT Assessment Patent does not need any further PT services  PT Diagnosis     PT Problem List    PT Treatment Interventions     PT Goals (Current goals can be found in the Care Plan section) Acute Rehab PT Goals PT Goal Formulation: No goals set, d/c therapy    Frequency     Barriers to discharge        Co-evaluation               End of Session   Activity Tolerance: Patient tolerated treatment well Patient left: in chair Nurse Communication: Mobility status         Time: 1310-1319 PT Time Calculation (min): 9 min   Charges:    PT Evaluation $Initial PT Evaluation Tier I: 1 Procedure PT Treatments $Gait Training: 8-22 mins   PT G Codes:          Anaya Bovee,KATHrine E 12/21/2013, 2:52 PM Carmelia Bake, PT, DPT 12/21/2013 Pager: [phone]

## 2013-12-21 NOTE — Progress Notes (Signed)
Patient d/c home. Stable.- Marion Rosenberry RN 

## 2013-12-21 NOTE — Discharge Summary (Signed)
Physician Discharge Summary  Tanner [name] ZDG:[id number] DOB: 1946/02/23 DOA: 12/19/2013  PCP: No primary provider on file.  Admit date: 12/19/2013 Discharge date: 12/21/2013  Recommendations for Outpatient Follow-up:  1. Pt will need to follow up with PCP in 2-3 weeks post discharge 2. Please obtain BMP to evaluate electrolytes and kidney function 3. Please also check CBC to evaluate Hg and Hct levels 4. Continue vancomycin PO upon discharge for C. Diff colitis   Discharge Diagnoses: C. Diff colitis  Principal Problem:   C. difficile colitis Active Problems:   DEPRESSION   OBSTRUCTIVE SLEEP APNEA   BENIGN PROSTATIC HYPERTROPHY   History of pulmonary embolus (PE)   Acute kidney injury    Discharge Condition: Stable  Diet recommendation: Heart healthy diet discussed in details   Brief narrative:  68 year old obese male with history of hypertension, depression, DVT and PE completed anticoagulation recently who [facility] with abdominal cramps and ongoing diarrhea for the past 3 weeks. Patient reported being on a 2 weeks course of amoxicillin for a dental procedure about one month back. Shortly after completing the antibiotics he started having 2-3 episodes of diarrhea daily. He saw his hematologist Dr. Lucrezia Starch on June 18th as a followup for his DVT and PE and he was placed on oral Flagyl for a 7 day course. He reports his diarrhea to have subsided during that time but as soon as he completed the antibiotic he again started having watery diarrhea.   Principal Problem:  C. difficile colitis  - pt reports clinically improving and able to tolerate current diet, full liquid  - continue oral vancomycin day #3/14  - continue to provide supportive care, advance diet to regular  Active Problems:  Leukocytosis  - from C. Dif colitis  - ABX as noted above  - WBC is trending down DEPRESSION  - clinically stable  OBSTRUCTIVE SLEEP APNEA  - stable, pt maintaining  oxygen saturation at target range  BENIGN PROSTATIC HYPERTROPHY  - good urine output  History of pulmonary embolus (PE)  - recently completed AC therapy  Acute kidney failure  - secondary to pre renal etiology  - IVF provided and pt responded well, Cr is now stable and WNL   Consultants:  None Procedures/Studies:  Dg Chest 2 View 12/19/2013 COPD. There is no evidence of pneumonia nor CHF.  Ct Abdomen Pelvis W Contrast 12/19/2013 Diffuse colitis. Bilateral renal simple cysts again noted. Mild prostate prominence.  Antibiotics:  Vancomycin 7/7 --> continue upon discharge   Code Status: Full  Family Communication: Pt at bedside  Disposition Plan: Home  Discharge Exam: Filed Vitals:   12/21/13 0954  BP: 140/72  Pulse: 75  Temp:   Resp:    Filed Vitals:   12/20/13 0530 12/20/13 2047 12/21/13 0532 12/21/13 0954  BP: 129/68 126/55 116/58 140/72  Pulse: 69 64 71 75  Temp: 98.1 F (36.7 C) 97.9 F (36.6 C) 97.4 F (36.3 C)   TempSrc: Oral Oral Oral   Resp: 20 20 18    Height:      Weight:      SpO2: 93% 90% 95%     General: Pt is alert, follows commands appropriately, not in acute distress Cardiovascular: Regular rate and rhythm, S1/S2 +, no murmurs, no rubs, no gallops Respiratory: Clear to auscultation bilaterally, no wheezing, no crackles, no rhonchi  Discharge Instructions  Discharge Instructions   Diet - low sodium heart healthy    Complete by:  As directed  Increase activity slowly    Complete by:  As directed             Medication List    STOP taking these medications       metroNIDAZOLE 500 MG tablet  Commonly known as:  FLAGYL      TAKE these medications       acetaminophen 500 MG tablet  Commonly known as:  TYLENOL  Take 2 tablets (1,000 mg total) by mouth every 8 (eight) hours as needed for pain.     aspirin 81 MG tablet  Take 162 mg by mouth daily.     atenolol 25 MG tablet  Commonly known as:  TENORMIN  Take 12.5 mg by mouth daily.      buPROPion 150 MG 12 hr tablet  Commonly known as:  WELLBUTRIN SR  Take 150 mg by mouth 2 (two) times daily.     finasteride 5 MG tablet  Commonly known as:  PROSCAR  Take 5 mg by mouth at bedtime.     gabapentin 300 MG capsule  Commonly known as:  NEURONTIN  Take 600 mg by mouth 2 (two) times daily.     hydrochlorothiazide 25 MG tablet  Commonly known as:  HYDRODIURIL  Take 25 mg by mouth daily.     ketoconazole 2 % shampoo  Commonly known as:  NIZORAL  Apply topically daily.     multivitamins ther. w/minerals Tabs tablet  Take 1 tablet by mouth daily.     potassium chloride SA 20 MEQ tablet  Commonly known as:  K-DUR,KLOR-CON  Take 1 tablet (20 mEq total) by mouth daily.     saccharomyces boulardii 250 MG capsule  Commonly known as:  FLORASTOR  Take 1 capsule (250 mg total) by mouth 2 (two) times daily.     THERA-GESIC PLUS Crea  Apply 1 application topically at bedtime. Apply to lower back     traMADol 50 MG tablet  Commonly known as:  ULTRAM  Take 100 mg by mouth 3 (three) times daily. Takes 2 tabs     vancomycin 50 mg/mL oral solution  Commonly known as:  VANCOCIN  Take 2.5 mLs (125 mg total) by mouth 4 (four) times daily. Stop taking after July 21st, 2015.     zolpidem 10 MG tablet  Commonly known as:  AMBIEN  Take 1 tablet (10 mg total) by mouth at bedtime. For sleep           Follow-up Information   Follow up with Faye Ramsay, MD. (As needed, If symptoms worsen, call my cell phone [phone])    Specialty:  Internal Medicine   Contact information:   [address] [place] [address] [phone]        The results of significant diagnostics from this hospitalization (including imaging, microbiology, ancillary and laboratory) are listed below for reference.     Microbiology: Recent Results (from the past 240 hour(s))  STOOL CULTURE     Status: None   Collection Time    12/19/13 12:22 PM      Result Value Ref Range Status    Specimen Description STOOL   Final   Special Requests NONE   Final   Culture     Final   Value: NO SUSPICIOUS COLONIES, CONTINUING TO HOLD     Performed at [facility]   Report Status PENDING   Incomplete  CLOSTRIDIUM DIFFICILE BY PCR     Status: Abnormal   Collection Time  12/19/13 12:22 PM      Result Value Ref Range Status   C difficile by pcr POSITIVE (*) NEGATIVE Final   Comment: CRITICAL RESULT CALLED TO, READ BACK BY AND VERIFIED WITH:     A. BURNS RN 14:55 12/19/13 ([email or web address])     Performed at [facility]: Basic Metabolic Panel:  Recent Labs Lab 12/19/13 1115 12/20/13 0418 12/21/13 0020  NA 138 135* 135*  K 4.0 3.9 3.6*  CL 97 98 99  CO2 25 25 25   GLUCOSE 131* 104* 88  BUN 31* 28* 21  CREATININE 1.50* 1.27 1.10  CALCIUM 9.2 8.4 8.2*   Liver Function Tests:  Recent Labs Lab 12/19/13 1115  AST 16  ALT 13  ALKPHOS 67  BILITOT 0.8  PROT 6.0  ALBUMIN 3.0*    Recent Labs Lab 12/19/13 1115  LIPASE 12   CBC:  Recent Labs Lab 12/19/13 1115 12/20/13 0418 12/21/13 0020  WBC 16.9* 14.4* 10.9*  NEUTROABS 14.2*  --   --   HGB 17.0 14.5 13.3  HCT 50.4 44.6 40.6  MCV 86.2 86.6 86.8  PLT 231 210 196   BNP (last 3 results)  Recent Labs  12/19/13 1115  PROBNP 46.4   CBG: No results found for this basename: GLUCAP,  in the last 168 hours   SIGNED: Time coordinating discharge: Over 30 minutes  Faye Ramsay, MD  [facility] 12/21/2013, 11:17 AM Pager [phone]  If 7PM-7AM, please contact night-coverage [email or web address] Password [email or web address]

## 2013-12-23 LAB — STOOL CULTURE

## 2013-12-29 ENCOUNTER — Ambulatory Visit (INDEPENDENT_AMBULATORY_CARE_PROVIDER_SITE_OTHER): Payer: Medicare HMO | Admitting: General Practice

## 2013-12-29 ENCOUNTER — Ambulatory Visit (HOSPITAL_BASED_OUTPATIENT_CLINIC_OR_DEPARTMENT_OTHER): Payer: Medicare HMO | Admitting: Hematology & Oncology

## 2013-12-29 ENCOUNTER — Other Ambulatory Visit (HOSPITAL_BASED_OUTPATIENT_CLINIC_OR_DEPARTMENT_OTHER): Payer: Medicare HMO | Admitting: Lab

## 2013-12-29 ENCOUNTER — Encounter: Payer: Self-pay | Admitting: Hematology & Oncology

## 2013-12-29 VITALS — BP 117/49 | HR 68 | Temp 98.0°F | Resp 18 | Ht 66.0 in | Wt 276.0 lb

## 2013-12-29 DIAGNOSIS — I2699 Other pulmonary embolism without acute cor pulmonale: Secondary | ICD-10-CM

## 2013-12-29 DIAGNOSIS — Z86711 Personal history of pulmonary embolism: Secondary | ICD-10-CM

## 2013-12-29 DIAGNOSIS — Z86718 Personal history of other venous thrombosis and embolism: Secondary | ICD-10-CM

## 2013-12-29 DIAGNOSIS — R197 Diarrhea, unspecified: Secondary | ICD-10-CM

## 2013-12-29 DIAGNOSIS — K029 Dental caries, unspecified: Secondary | ICD-10-CM

## 2013-12-29 LAB — CBC WITH DIFFERENTIAL (CANCER CENTER ONLY)
BASO#: 0 10*3/uL (ref 0.0–0.2)
BASO%: 0.3 % (ref 0.0–2.0)
EOS%: 2.4 % (ref 0.0–7.0)
Eosinophils Absolute: 0.2 10*3/uL (ref 0.0–0.5)
HCT: 43.7 % (ref 38.7–49.9)
HEMOGLOBIN: 14.6 g/dL (ref 13.0–17.1)
LYMPH#: 2.1 10*3/uL (ref 0.9–3.3)
LYMPH%: 23.4 % (ref 14.0–48.0)
MCH: 29.4 pg (ref 28.0–33.4)
MCHC: 33.4 g/dL (ref 32.0–35.9)
MCV: 88 fL (ref 82–98)
MONO#: 0.6 10*3/uL (ref 0.1–0.9)
MONO%: 6.2 % (ref 0.0–13.0)
NEUT#: 6.1 10*3/uL (ref 1.5–6.5)
NEUT%: 67.7 % (ref 40.0–80.0)
PLATELETS: 201 10*3/uL (ref 145–400)
RBC: 4.96 10*6/uL (ref 4.20–5.70)
RDW: 15.5 % (ref 11.1–15.7)
WBC: 9 10*3/uL (ref 4.0–10.0)

## 2013-12-29 LAB — CMP (CANCER CENTER ONLY)
ALBUMIN: 3.1 g/dL — AB (ref 3.3–5.5)
ALK PHOS: 59 U/L (ref 26–84)
ALT: 27 U/L (ref 10–47)
AST: 24 U/L (ref 11–38)
BUN, Bld: 15 mg/dL (ref 7–22)
CO2: 33 mEq/L (ref 18–33)
Calcium: 8.9 mg/dL (ref 8.0–10.3)
Chloride: 97 mEq/L — ABNORMAL LOW (ref 98–108)
Creat: 1.2 mg/dl (ref 0.6–1.2)
GLUCOSE: 110 mg/dL (ref 73–118)
POTASSIUM: 3.4 meq/L (ref 3.3–4.7)
SODIUM: 136 meq/L (ref 128–145)
TOTAL PROTEIN: 5.7 g/dL — AB (ref 6.4–8.1)
Total Bilirubin: 0.6 mg/dl (ref 0.20–1.60)

## 2013-12-29 NOTE — Progress Notes (Signed)
Hematology and Oncology Follow Up Visit  REICE BIENVENUE [id number] 12/13/1945 68 y.o. 12/29/2013   Principle Diagnosis:   DVT of the left leg.  Pulmonary embolus  Current Therapy:    Aspirin 162 mg by mouth daily     Interim History:  Mr.  Orrego is back for followup. He recently was hospitalized because of clostridium difficile colitis. He got this from taking antibiotics for a urinary tract infection and for an oral abscess. He was on clindamycin and amoxicillin.  He currently is on oral vancomycin.  Thank you, he is off Coumadin. We got him off Coumadin we repeated his Doppler test which did not show any evidence of recurrent or residual thrombus in the left leg.  We aren't checking his protein S levels to make sure that he does not have a protein S deficiency.  He still needs some oral surgery. I will see about referring him to our dental clinic to see if they can help.  He's had no bleeding. His appetite is coming back. He's had no cough or shortness of breath. There's been no chest wall pain.  Medications: Current outpatient prescriptions:acetaminophen (TYLENOL) 500 MG tablet, Take 2 tablets (1,000 mg total) by mouth every 8 (eight) hours as needed for pain., Disp: 30 tablet, Rfl: 0;  aspirin 81 MG tablet, Take 162 mg by mouth daily., Disp: , Rfl: ;  atenolol (TENORMIN) 25 MG tablet, Take 12.5 mg by mouth daily., Disp: , Rfl: ;  buPROPion (WELLBUTRIN SR) 150 MG 12 hr tablet, Take 150 mg by mouth 2 (two) times daily., Disp: , Rfl:  finasteride (PROSCAR) 5 MG tablet, Take 5 mg by mouth at bedtime., Disp: , Rfl: ;  gabapentin (NEURONTIN) 300 MG capsule, Take 600 mg by mouth 2 (two) times daily. , Disp: , Rfl: ;  hydrochlorothiazide (HYDRODIURIL) 25 MG tablet, Take 25 mg by mouth daily., Disp: , Rfl: ;  ketoconazole (NIZORAL) 2 % shampoo, Apply topically daily., Disp: , Rfl:  Menthol-Methyl Salicylate (THERA-GESIC PLUS) CREA, Apply 1 application topically at bedtime. Apply to lower back,  Disp: , Rfl: ;  metroNIDAZOLE (FLAGYL) 500 MG tablet, , Disp: , Rfl: ;  Multiple Vitamins-Minerals (MULTIVITAMINS THER. W/MINERALS) TABS, Take 1 tablet by mouth daily., Disp: , Rfl: ;  saccharomyces boulardii (FLORASTOR) 250 MG capsule, Take 1 capsule (250 mg total) by mouth 2 (two) times daily., Disp: 60 capsule, Rfl: 1 traMADol (ULTRAM) 50 MG tablet, Take 100 mg by mouth 3 (three) times daily. Takes 2 tabs, Disp: , Rfl: ;  vancomycin (VANCOCIN) 50 mg/mL oral solution, Take 2.5 mLs (125 mg total) by mouth 4 (four) times daily. Stop taking after July 21st, 2015., Disp: 150 mL, Rfl: 0;  zolpidem (AMBIEN) 10 MG tablet, Take 1 tablet (10 mg total) by mouth at bedtime. For sleep, Disp: 30 tablet, Rfl: 0 potassium chloride SA (K-DUR,KLOR-CON) 20 MEQ tablet, Take 1 tablet (20 mEq total) by mouth daily., Disp: 5 tablet, Rfl: 0  Allergies:  Allergies  Allergen Reactions  . Codeine Sulfate Itching and Nausea Only    Past Medical History, Surgical history, Social history, and Family History were reviewed and updated.  Review of Systems: As above  Physical Exam:  height is 5\' 6"  (1.676 m) and weight is 276 lb (125.193 kg). His oral temperature is 98 F (36.7 C). His blood pressure is 117/49 and his pulse is 68. His respiration is 18.   Obese white woman in no obvious distress. Lungs are clear. Cardiac exam regular in  rhythm with no murmurs rubs or bruits. Abdomen is soft. He is obese. Has good bowel sounds. There is no guarding or rebound tenderness. There is no palpable liver or spleen tip. Back exam shows no tenderness over the spine ribs or hips. Extremities shows no clubbing cyanosis or edema. No venous cord is noted in his legs. His good range motion of his joints. Skin exam no rashes ecchymosis or petechia. Neurological exam shows no focal deficits.  Lab Results  Component Value Date   WBC 9.0 12/29/2013   HGB 14.6 12/29/2013   HCT 43.7 12/29/2013   MCV 88 12/29/2013   PLT 201 12/29/2013      Chemistry      Component Value Date/Time   NA 136 12/29/2013 1356   NA 135* 12/21/2013 0020   K 3.4 12/29/2013 1356   K 3.6* 12/21/2013 0020   CL 97* 12/29/2013 1356   CL 99 12/21/2013 0020   CO2 33 12/29/2013 1356   CO2 25 12/21/2013 0020   BUN 15 12/29/2013 1356   BUN 21 12/21/2013 0020   CREATININE 1.2 12/29/2013 1356   CREATININE 1.10 12/21/2013 0020      Component Value Date/Time   CALCIUM 8.9 12/29/2013 1356   CALCIUM 8.2* 12/21/2013 0020   ALKPHOS 59 12/29/2013 1356   ALKPHOS 67 12/19/2013 1115   AST 24 12/29/2013 1356   AST 16 12/19/2013 1115   ALT 27 12/29/2013 1356   ALT 13 12/19/2013 1115   BILITOT 0.60 12/29/2013 1356   BILITOT 0.8 12/19/2013 1115         Impression and Plan: Mr. Olazabal is a 68 year old gentleman with a history of a thrombus in the left leg and pulmonary embolism. He was on anticoagulation for about 10 months. We had him on Coumadin. He did well with this.  We were sending off a protein S levels. Again, I want to make sure that he does not have any protein S deficiency.  I feel bad about the colitis. This does seem to be getting better.  We can probably get him back down in 3 months.  Again, we will make a referral for the dental clinic.   Volanda Napoleon, MD 7/17/20155:58 PM

## 2013-12-30 LAB — D-DIMER, QUANTITATIVE (NOT AT ARMC): D DIMER QUANT: 0.79 ug{FEU}/mL — AB (ref 0.00–0.48)

## 2014-01-01 ENCOUNTER — Telehealth: Payer: Self-pay | Admitting: Hematology & Oncology

## 2014-01-01 LAB — PROTEIN S ACTIVITY: PROTEIN S ACTIVITY: 71 % (ref 69–129)

## 2014-01-01 LAB — PROTEIN S, ANTIGEN, FREE: PROTEIN S AG FREE: 72 %{normal} (ref 57–171)

## 2014-01-01 NOTE — Telephone Encounter (Signed)
Left message with Dental Clinic to please call and confirm they got referral in Epic

## 2014-01-09 ENCOUNTER — Telehealth (HOSPITAL_COMMUNITY): Payer: Self-pay

## 2014-01-09 NOTE — Telephone Encounter (Signed)
01/09/2014 Called and spoke w/patient about following up with regular dentist for caries treatment on exsisting bridges and implants. Also, recommended other dentist for possible other dental treatment as well.  LRI

## 2014-01-23 ENCOUNTER — Ambulatory Visit (INDEPENDENT_AMBULATORY_CARE_PROVIDER_SITE_OTHER): Payer: Medicare HMO | Admitting: Internal Medicine

## 2014-01-23 ENCOUNTER — Encounter: Payer: Self-pay | Admitting: Internal Medicine

## 2014-01-23 ENCOUNTER — Other Ambulatory Visit: Payer: Self-pay | Admitting: Internal Medicine

## 2014-01-23 ENCOUNTER — Other Ambulatory Visit (INDEPENDENT_AMBULATORY_CARE_PROVIDER_SITE_OTHER): Payer: Medicare HMO

## 2014-01-23 VITALS — BP 128/72 | HR 78 | Temp 98.2°F | Wt 278.5 lb

## 2014-01-23 DIAGNOSIS — R197 Diarrhea, unspecified: Secondary | ICD-10-CM

## 2014-01-23 DIAGNOSIS — R609 Edema, unspecified: Secondary | ICD-10-CM

## 2014-01-23 LAB — CBC WITH DIFFERENTIAL/PLATELET
Basophils Absolute: 0.1 10*3/uL (ref 0.0–0.1)
Basophils Relative: 0.7 % (ref 0.0–3.0)
Eosinophils Absolute: 0.1 10*3/uL (ref 0.0–0.7)
Eosinophils Relative: 0.6 % (ref 0.0–5.0)
HCT: 45.3 % (ref 39.0–52.0)
HEMOGLOBIN: 15.3 g/dL (ref 13.0–17.0)
LYMPHS PCT: 13.6 % (ref 12.0–46.0)
Lymphs Abs: 1.8 10*3/uL (ref 0.7–4.0)
MCHC: 33.7 g/dL (ref 30.0–36.0)
MCV: 87.5 fl (ref 78.0–100.0)
MONOS PCT: 6.3 % (ref 3.0–12.0)
Monocytes Absolute: 0.8 10*3/uL (ref 0.1–1.0)
NEUTROS ABS: 10.3 10*3/uL — AB (ref 1.4–7.7)
Neutrophils Relative %: 78.8 % — ABNORMAL HIGH (ref 43.0–77.0)
Platelets: 186 10*3/uL (ref 150.0–400.0)
RBC: 5.17 Mil/uL (ref 4.22–5.81)
RDW: 16.7 % — ABNORMAL HIGH (ref 11.5–15.5)
WBC: 13.1 10*3/uL — ABNORMAL HIGH (ref 4.0–10.5)

## 2014-01-23 LAB — BASIC METABOLIC PANEL
BUN: 18 mg/dL (ref 6–23)
CALCIUM: 8.6 mg/dL (ref 8.4–10.5)
CO2: 24 mEq/L (ref 19–32)
Chloride: 104 mEq/L (ref 96–112)
Creatinine, Ser: 1.3 mg/dL (ref 0.4–1.5)
GFR: 60.83 mL/min (ref >60.00)
Glucose, Bld: 90 mg/dL (ref 70–99)
Potassium: 3.9 mEq/L (ref 3.5–5.1)
SODIUM: 139 meq/L (ref 135–145)

## 2014-01-23 MED ORDER — DIPHENOXYLATE-ATROPINE 2.5-0.025 MG PO TABS: 1.0000 | ORAL_TABLET | Freq: Four times a day (QID) | ORAL | Status: DC | PRN

## 2014-01-23 NOTE — Progress Notes (Signed)
Pre visit review using our clinic review tool, if applicable. No additional management support is needed unless otherwise documented below in the visit note. 

## 2014-01-23 NOTE — Progress Notes (Signed)
   Subjective:    Patient ID: Tanner [name], male    DOB: 03-Sep-1945, 68 y.o.   MRN: [id number]  HPI   He was hospitalized 7/7-12/21/13 with C. difficile colitis. This occurred after he had  been on 2 rounds of amoxicillin necessitated by dental work he is having done. This is a prelude to complete dentures  He completed 14 days of vancomycin as of 01/02/14  As of 01/18/14 he developed frankly watery stool. He continued to have up to 16 bowel movements a day. This is somewhat better through the day.There is essentially no formed stool.   He's lost 15-20 pounds during this illness.  He had been taking Florastor, probiotic but this was discontinued as he did not feel it was of benefit  He denies nausea, vomiting, dyspepsia, abdominal pain, hematemesis, melena, or rectal bleeding.  He has not had fever but has noted some chills.  He also denies lightheadedness or decreased urine volume.  He has had some increased lower extremity edema. He does not restrict sodium. This did improve with support hose. He is concerned in that he has had a past history of deep venous thrombosis and pulmonary thromboemboli. He has no active cardio pulmonary symptoms at this time.   Review of Systems    Chest pain, palpitations, tachycardia, exertional dyspnea, paroxysmal nocturnal dyspnea, or claudication are absent.        Objective:   Physical Exam Pertinent positive findings include: There is asymmetry of the eyes; this relates to Bell's palsy and subsequent surgeries. He has an upper partial. He has fair to poor mandibular dentition. Abdomen is massive with central obesity.  He has a large ventral hernia He has 1+ pitting edema. Posterior tibial pulses are decreased due to the edema Homans sign is negative.  General appearance :adequately nourished; in no distress. Eyes: No conjunctival inflammation or scleral icterus is present. Oral exam:  Lips and gums are healthy appearing.There is no  oropharyngeal erythema or exudate noted.  Heart:  Normal rate and regular rhythm. S1 and S2 normal without gallop, murmur, click, rub or other extra sounds   Lungs:Chest clear to auscultation; no wheezes, rhonchi,rales ,or rubs present.No increased work of breathing.  Abdomen: bowel sounds normal, soft and non-tender without masses or organomegaly noted.  No guarding or rebound. No flank tenderness to percussion. Skin:Warm & dry.  Intact without suspicious lesions or rashes ; no jaundice or tenting Lymphatic: No lymphadenopathy is noted about the head, neck, axilla         Assessment & Plan:  #1 frank diarrhea; rule out recurrent C. difficile colitis #2 edema; clinically no DVT present  See orders and recommendations

## 2014-01-23 NOTE — Patient Instructions (Signed)
Immodium AD as needed for frank diarrhea  Please take a probiotic , Florastor OR Align, every day if stools loose. This will replace the normal bacteria which  are necessary for formation of normal stool and processing of food.   Avoid ingestion of  excess salt/sodium.Cook with pepper & other spices . Use the salt substitute "No Salt" OR the Mrs Deliah Boston products to season food @ the table. Avoid foods which taste salty or "vinegary" as their sodium content will be high.

## 2014-01-24 ENCOUNTER — Telehealth: Payer: Self-pay

## 2014-01-24 LAB — C. DIFFICILE GDH AND TOXIN A/B
C. difficile GDH: DETECTED — AB
C. difficile Toxin A/B: DETECTED — AB

## 2014-01-24 NOTE — Telephone Encounter (Signed)
Phone call from [employer] lab. Patient tested positive for c-diff. Has been on Vancomycin 50 mg/ml solution in the past, with directions to take 2.5 ml by mouth four times a day. 150 ml was dispensed.

## 2014-01-25 ENCOUNTER — Telehealth: Payer: Self-pay | Admitting: *Deleted

## 2014-01-25 ENCOUNTER — Other Ambulatory Visit: Payer: Self-pay | Admitting: Internal Medicine

## 2014-01-25 DIAGNOSIS — A0472 Enterocolitis due to Clostridium difficile, not specified as recurrent: Secondary | ICD-10-CM

## 2014-01-25 MED ORDER — VANCOMYCIN 50 MG/ML ORAL SOLUTION: 125.0000 mg | Freq: Four times a day (QID) | ORAL | Status: DC

## 2014-01-25 NOTE — Telephone Encounter (Signed)
Wife called and stated md was suppose to send pt another refill on his vancomycin. MD sent msg on mychart with C-Diff results. I confirm msg inform wife will send to [place]...Tanner [name]

## 2014-01-26 ENCOUNTER — Encounter: Payer: Self-pay | Admitting: Internal Medicine

## 2014-03-29 ENCOUNTER — Encounter: Payer: Self-pay | Admitting: Internal Medicine

## 2014-03-29 ENCOUNTER — Ambulatory Visit (INDEPENDENT_AMBULATORY_CARE_PROVIDER_SITE_OTHER): Payer: Medicare HMO | Admitting: Internal Medicine

## 2014-03-29 VITALS — BP 134/64 | HR 72 | Ht 66.0 in | Wt 283.5 lb

## 2014-03-29 DIAGNOSIS — A047 Enterocolitis due to Clostridium difficile: Secondary | ICD-10-CM

## 2014-03-29 DIAGNOSIS — A0472 Enterocolitis due to Clostridium difficile, not specified as recurrent: Secondary | ICD-10-CM

## 2014-03-29 NOTE — Progress Notes (Signed)
HISTORY OF PRESENT ILLNESS:  Tanner [name] is a 68 y.o. male with multiple significant medical problems including morbid obesity who presents today upon referral from Dr. hopper regarding recurrent Clostridium difficile diarrhea. Patient has had medical care at the [facility] over the past 3 years. He reports colonoscopy 2 years ago with some polyps removed. In April of this year he was treated with antibiotics for urinary tract infection as well as dental issues. In June he developed Clostridium difficile associated diarrhea which resulted in 3 day hospitalization. He was discharged on vancomycin with resolution of diarrhea. However, in August diarrhea recurred. He was treated with vancomycin for 2 weeks. Diarrhea has resolved. No recurrence since. He reports normal bowel habits at his baseline. GI review of systems is otherwise negative.  REVIEW OF SYSTEMS:  All non-GI ROS negative except for arthritis, back pain, swelling of the feet  Past Medical History  Diagnosis Date  . Hypertension   . Depression   . Neuromuscular disorder   . Back pain   . Low back pain potentially associated with spinal stenosis   . Neuropathy of lower extremity     bilateral  . Clostridium difficile infection   . Ventral hernia   . DVT (deep venous thrombosis)   . PE (pulmonary embolism)   . OSA (obstructive sleep apnea)   . BPH (benign prostatic hypertrophy)   . Acute kidney failure     Past Surgical History  Procedure Laterality Date  . Eye surgery    . Hernia repair      Social History Tanner [name]  reports that he quit smoking about 12 years ago. His smoking use included Cigarettes. He started smoking about 45 years ago. He has a 66 pack-year smoking history. He has never used smokeless tobacco. He reports that he drinks alcohol. He reports that he does not use illicit drugs.  family history includes Alzheimer's disease in his mother; Diabetes in his mother; Pneumonia in his mother. There is no  history of Colon cancer or Colon polyps.  Allergies  Allergen Reactions  . Codeine Sulfate Itching and Nausea Only       PHYSICAL EXAMINATION: Vital signs: BP 134/64  Pulse 72  Ht 5\' 6"  (1.676 m)  Wt 283 lb 8 oz (128.595 kg)  BMI 45.78 kg/m2 General: Markedly obese, Well-developed, well-nourished, no acute distress HEENT: Sclerae are anicteric, conjunctiva pink. Oral mucosa intact Lungs: Clear Heart: Regular Abdomen: soft, obese, nontender, nondistended, no obvious ascites, no peritoneal signs, normal bowel sounds. No organomegaly. Extremities: No edema Psychiatric: alert and oriented x3. Cooperative   ASSESSMENT:  #1. Recurrent Clostridium difficile colitis in face of antibiotic therapy. Resolved after her second course of vancomycin   PLAN:  #1. I discussed with him Clostridium difficile associated diarrhea. I discussed pathophysiology as well as recurrence rates and treatment options for recurrent or recalcitrant disease. Thankfully, he is doing well at this time. I did caution him with regards to future antibiotic use. I asked him to contact the office if needed. Otherwise, resume general medical care with his primary provider

## 2014-03-29 NOTE — Patient Instructions (Signed)
Please follow up with Dr. Perry as needed 

## 2014-03-30 ENCOUNTER — Encounter: Payer: Self-pay | Admitting: Hematology & Oncology

## 2014-03-30 ENCOUNTER — Other Ambulatory Visit (HOSPITAL_BASED_OUTPATIENT_CLINIC_OR_DEPARTMENT_OTHER): Payer: Medicare HMO | Admitting: Lab

## 2014-03-30 ENCOUNTER — Ambulatory Visit (HOSPITAL_BASED_OUTPATIENT_CLINIC_OR_DEPARTMENT_OTHER): Payer: Medicare HMO | Admitting: Hematology & Oncology

## 2014-03-30 VITALS — BP 128/56 | HR 71 | Temp 97.7°F | Resp 18 | Ht 66.0 in | Wt 284.0 lb

## 2014-03-30 DIAGNOSIS — Z86718 Personal history of other venous thrombosis and embolism: Secondary | ICD-10-CM

## 2014-03-30 DIAGNOSIS — I2699 Other pulmonary embolism without acute cor pulmonale: Secondary | ICD-10-CM

## 2014-03-30 DIAGNOSIS — Z86711 Personal history of pulmonary embolism: Secondary | ICD-10-CM

## 2014-03-30 DIAGNOSIS — I82402 Acute embolism and thrombosis of unspecified deep veins of left lower extremity: Secondary | ICD-10-CM

## 2014-03-30 LAB — CBC WITH DIFFERENTIAL (CANCER CENTER ONLY)
BASO#: 0 10*3/uL (ref 0.0–0.2)
BASO%: 0.4 % (ref 0.0–2.0)
EOS%: 2 % (ref 0.0–7.0)
Eosinophils Absolute: 0.2 10*3/uL (ref 0.0–0.5)
HEMATOCRIT: 42 % (ref 38.7–49.9)
HEMOGLOBIN: 14.3 g/dL (ref 13.0–17.1)
LYMPH#: 2 10*3/uL (ref 0.9–3.3)
LYMPH%: 23.4 % (ref 14.0–48.0)
MCH: 30.3 pg (ref 28.0–33.4)
MCHC: 34 g/dL (ref 32.0–35.9)
MCV: 89 fL (ref 82–98)
MONO#: 0.8 10*3/uL (ref 0.1–0.9)
MONO%: 9.1 % (ref 0.0–13.0)
NEUT#: 5.6 10*3/uL (ref 1.5–6.5)
NEUT%: 65.1 % (ref 40.0–80.0)
Platelets: 176 10*3/uL (ref 145–400)
RBC: 4.72 10*6/uL (ref 4.20–5.70)
RDW: 14.9 % (ref 11.1–15.7)
WBC: 8.6 10*3/uL (ref 4.0–10.0)

## 2014-03-30 NOTE — Progress Notes (Signed)
Hematology and Oncology Follow Up Visit  Tanner [name] [id number] Oct 20, 1945 68 y.o. 03/30/2014   Principle Diagnosis:   DVT of the left leg.  Pulmonary embolus  Current Therapy:    Aspirin 162 mg by mouth daily     Interim History:  [name] is back for followup. He's doing pretty well. He now has dentures in. He had his teeth removed and had a full dentures put in. He's getting use to these.  He is on aspirin. He was on Coumadin for 10 months. He presented with a pulmonary embolism  and left leg thrombus back in September of 2014.  We repeated his protein S levels. They were normal. His protein S activity was 71%.  He's had no cough. There's no shortness of breath. He's had no bleeding. He's had no leg swelling.  He is trying to exercise a little more.     Medications: Current outpatient prescriptions:acetaminophen (TYLENOL) 500 MG tablet, Take 1,000 mg by mouth as needed., Disp: , Rfl: ;  aspirin 81 MG tablet, Take 162 mg by mouth daily., Disp: , Rfl: ;  atenolol (TENORMIN) 25 MG tablet, Take 12.5 mg by mouth daily., Disp: , Rfl: ;  buPROPion (WELLBUTRIN SR) 150 MG 12 hr tablet, Take 150 mg by mouth 2 (two) times daily., Disp: , Rfl:  diphenoxylate-atropine (LOMOTIL) 2.5-0.025 MG per tablet, Take 1 tablet by mouth as needed for diarrhea or loose stools., Disp: , Rfl: ;  finasteride (PROSCAR) 5 MG tablet, Take 5 mg by mouth at bedtime., Disp: , Rfl: ;  gabapentin (NEURONTIN) 300 MG capsule, Take 600 mg by mouth 2 (two) times daily. , Disp: , Rfl: ;  hydrochlorothiazide (HYDRODIURIL) 25 MG tablet, Take 25 mg by mouth daily., Disp: , Rfl:  ketoconazole (NIZORAL) 2 % shampoo, Apply topically daily., Disp: , Rfl: ;  Menthol-Methyl Salicylate (THERA-GESIC PLUS) CREA, Apply 1 application topically at bedtime. Apply to lower back, Disp: , Rfl: ;  Multiple Vitamins-Minerals (MULTIVITAMINS THER. W/MINERALS) TABS, Take 1 tablet by mouth daily., Disp: , Rfl: ;  potassium chloride SA  (K-DUR,KLOR-CON) 20 MEQ tablet, Take 20 mEq by mouth as needed., Disp: , Rfl:  saccharomyces boulardii (FLORASTOR) 250 MG capsule, Take 1 capsule (250 mg total) by mouth 2 (two) times daily., Disp: 60 capsule, Rfl: 1;  traMADol (ULTRAM) 50 MG tablet, Take 100 mg by mouth 3 (three) times daily. Takes 2 tabs, Disp: , Rfl: ;  zolpidem (AMBIEN) 10 MG tablet, Take 1 tablet (10 mg total) by mouth at bedtime. For sleep, Disp: 30 tablet, Rfl: 0  Allergies:  Allergies  Allergen Reactions  . Codeine Sulfate Itching and Nausea Only    Past Medical History, Surgical history, Social history, and Family History were reviewed and updated.  Review of Systems: As above  Physical Exam:  height is 5\' 6"  (1.676 m) and weight is 284 lb (128.822 kg). His oral temperature is 97.7 F (36.5 C). His blood pressure is 128/56 and his pulse is 71. His respiration is 18.   Obese white woman in no obvious distress. Lungs are clear. Cardiac exam regular in rhythm with no murmurs rubs or bruits. Abdomen is soft. He is obese. Has good bowel sounds. There is no guarding or rebound tenderness. There is no palpable liver or spleen tip. Back exam shows no tenderness over the spine ribs or hips. Extremities shows no clubbing cyanosis or edema. No venous cord is noted in his legs. His good range motion of his joints. Skin  exam no rashes ecchymosis or petechia. Neurological exam shows no focal deficits.  Lab Results  Component Value Date   WBC 8.6 03/30/2014   HGB 14.3 03/30/2014   HCT 42.0 03/30/2014   MCV 89 03/30/2014   PLT 176 03/30/2014     Chemistry      Component Value Date/Time   NA 139 01/23/2014 1233   NA 136 12/29/2013 1356   K 3.9 01/23/2014 1233   K 3.4 12/29/2013 1356   CL 104 01/23/2014 1233   CL 97* 12/29/2013 1356   CO2 24 01/23/2014 1233   CO2 33 12/29/2013 1356   BUN 18 01/23/2014 1233   BUN 15 12/29/2013 1356   CREATININE 1.3 01/23/2014 1233   CREATININE 1.2 12/29/2013 1356      Component Value Date/Time    CALCIUM 8.6 01/23/2014 1233   CALCIUM 8.9 12/29/2013 1356   ALKPHOS 59 12/29/2013 1356   ALKPHOS 67 12/19/2013 1115   AST 24 12/29/2013 1356   AST 16 12/19/2013 1115   ALT 27 12/29/2013 1356   ALT 13 12/19/2013 1115   BILITOT 0.60 12/29/2013 1356   BILITOT 0.8 12/19/2013 1115         Impression and Plan: Tanner [name] is a 68 year old gentleman with a history of a thrombus in the left leg and pulmonary embolism. He was on anticoagulation for about 10 months. We had him on Coumadin. He did well with this.  I think we can let him go from the clinic now. He comes in with his wife so we do see him if there is any problems.  Hopefully, he will not have problems down the road. He does have some risk factors, mostly his weight.   Volanda Napoleon, MD 10/16/20153:56 PM

## 2014-04-13 LAB — BASIC METABOLIC PANEL
BUN: 23 mg/dL — AB (ref 4–21)
CREATININE: 1.4 mg/dL — AB (ref 0.6–1.3)
Glucose: 87 mg/dL
POTASSIUM: 3.5 mmol/L (ref 3.4–5.3)
Sodium: 140 mmol/L (ref 137–147)

## 2014-04-13 LAB — HEPATIC FUNCTION PANEL
ALT: 32 U/L (ref 10–40)
AST: 22 U/L (ref 14–40)
Alkaline Phosphatase: 104 U/L (ref 25–125)
Bilirubin, Total: 0.6 mg/dL

## 2014-04-13 LAB — LIPID PANEL
CHOLESTEROL: 91 mg/dL (ref 0–200)
HDL: 31 mg/dL — AB (ref 35–70)
LDL Cholesterol: 40 mg/dL
TRIGLYCERIDES: 298 mg/dL — AB (ref 40–160)

## 2014-04-13 LAB — HEMOGLOBIN A1C: HEMOGLOBIN A1C: 2.1 % — AB (ref 4.0–6.0)

## 2014-04-19 ENCOUNTER — Encounter: Payer: Self-pay | Admitting: Internal Medicine

## 2014-04-19 ENCOUNTER — Ambulatory Visit (INDEPENDENT_AMBULATORY_CARE_PROVIDER_SITE_OTHER): Payer: Medicare HMO | Admitting: Internal Medicine

## 2014-04-19 VITALS — BP 124/72 | HR 72 | Temp 98.2°F | Resp 12 | Ht 68.0 in | Wt 289.4 lb

## 2014-04-19 DIAGNOSIS — Z86711 Personal history of pulmonary embolism: Secondary | ICD-10-CM

## 2014-04-19 DIAGNOSIS — A047 Enterocolitis due to Clostridium difficile: Secondary | ICD-10-CM

## 2014-04-19 DIAGNOSIS — A0472 Enterocolitis due to Clostridium difficile, not specified as recurrent: Secondary | ICD-10-CM

## 2014-04-19 DIAGNOSIS — Z0000 Encounter for general adult medical examination without abnormal findings: Secondary | ICD-10-CM

## 2014-04-19 DIAGNOSIS — N400 Enlarged prostate without lower urinary tract symptoms: Secondary | ICD-10-CM

## 2014-04-19 NOTE — Patient Instructions (Signed)
We want you to work on increasing her exercise and getting back in better shape. This will likely help take some of the stress off your back.   Come back in about 6-12 months for checkup. If you continue to have problems with swelling in your legs feel free to call us back sooner. If you're sitting at home please try to keep your legs elevated as this will reduce the amount of swelling.

## 2014-04-19 NOTE — Progress Notes (Signed)
   Subjective:    Patient ID: Tanner [name], male    DOB: 1946/04/18, 68 y.o.   MRN: [id number]  HPI The patient is a 68 year old man who has past medical history BPH, recurrent C. Difficile, depression, PE (9/14 status post 10 months course Coumadin). He still goes to the [place] for some of his medical care. He is is doing very well at this time and has not had any problem with his bowels. They're back to normal. He has not had any further leg pain or swelling. He does still have some edema in both legs however this has been going on for multiple months. No shortness of breath, chest pain. He is not exercising much lately and feels that he has been gaining some weight.  Review of Systems  Constitutional: Negative for fever, activity change, appetite change, fatigue and unexpected weight change.  HENT: Negative.   Respiratory: Negative for cough, chest tightness, shortness of breath and wheezing.   Cardiovascular: Negative for chest pain, palpitations and leg swelling.  Gastrointestinal: Negative for abdominal pain, diarrhea, constipation and abdominal distention.  Musculoskeletal: Negative.   Skin: Negative.   Neurological: Negative.       Objective:   Physical Exam  Constitutional: He is oriented to person, place, and time. He appears well-developed and well-nourished.  HENT:  Head: Normocephalic and atraumatic.  Eyes: EOM are normal.  Neck: Normal range of motion.  Cardiovascular: Normal rate and regular rhythm.   Pulmonary/Chest: Effort normal and breath sounds normal. No respiratory distress. He has no wheezes. He has no rales.  Abdominal: Soft. Bowel sounds are normal.  Neurological: He is alert and oriented to person, place, and time. Coordination normal.  Skin: Skin is warm and dry.   Filed Vitals:   04/19/14 1304  BP: 124/72  Pulse: 72  Temp: 98.2 F (36.8 C)  TempSrc: Oral  Resp: 12  Height: 5\' 8"  (1.727 m)  Weight: 289 lb 6.4 oz (131.271 kg)  SpO2: 95%        Assessment & Plan:

## 2014-04-19 NOTE — Progress Notes (Signed)
Pre visit review using our clinic review tool, if applicable. No additional management support is needed unless otherwise documented below in the visit note. 

## 2014-04-20 DIAGNOSIS — Z0000 Encounter for general adult medical examination without abnormal findings: Secondary | ICD-10-CM | POA: Insufficient documentation

## 2014-04-20 NOTE — Assessment & Plan Note (Signed)
Doing well on finasteride 

## 2014-04-20 NOTE — Assessment & Plan Note (Signed)
Without any known cause. He did 10 months of warfarin and now is off anticoagulation.

## 2014-04-20 NOTE — Assessment & Plan Note (Signed)
Bowels back to normal, no problems with diarrhea.

## 2014-04-20 NOTE — Assessment & Plan Note (Signed)
Prevention he has had colonoscopy, he has been offered CT scan of the chest high resolution for his past history of smoking. Does not want any injection today.

## 2014-06-21 ENCOUNTER — Ambulatory Visit (HOSPITAL_COMMUNITY): Payer: Self-pay

## 2014-07-09 LAB — HM COLONOSCOPY

## 2014-08-06 DIAGNOSIS — Z6833 Body mass index (BMI) 33.0-33.9, adult: Secondary | ICD-10-CM | POA: Insufficient documentation

## 2014-10-30 LAB — HM COLONOSCOPY

## 2014-11-07 LAB — BASIC METABOLIC PANEL
BUN: 18 mg/dL (ref 4–21)
Creatinine: 1.2 mg/dL (ref 0.6–1.3)
GLUCOSE: 84 mg/dL
POTASSIUM: 3.8 mmol/L (ref 3.4–5.3)
Sodium: 140 mmol/L (ref 137–147)

## 2014-11-07 LAB — HEPATIC FUNCTION PANEL
ALT: 29 U/L (ref 10–40)
AST: 17 U/L (ref 14–40)

## 2014-11-07 LAB — LIPID PANEL
CHOLESTEROL: 91 mg/dL (ref 0–200)
HDL: 29 mg/dL — AB (ref 35–70)
LDL CALC: 43 mg/dL
Triglycerides: 235 mg/dL — AB (ref 40–160)

## 2014-11-07 LAB — HEMOGLOBIN A1C: HEMOGLOBIN A1C: 5.8 % (ref 4.0–6.0)

## 2015-01-29 ENCOUNTER — Telehealth: Payer: Self-pay

## 2015-01-29 NOTE — Telephone Encounter (Signed)
Patient called to educate on Medicare Wellness apt. LVM for the patient to call back to educate and schedule for wellness visit.  Left message to call [phone] or practice at [phone] and schedule fup with [facility] and [facility] with Manuela Schwartz

## 2015-03-15 ENCOUNTER — Encounter: Payer: Self-pay | Admitting: Internal Medicine

## 2015-03-15 ENCOUNTER — Ambulatory Visit (INDEPENDENT_AMBULATORY_CARE_PROVIDER_SITE_OTHER): Payer: Medicare HMO | Admitting: Internal Medicine

## 2015-03-15 VITALS — BP 128/68 | HR 72 | Temp 97.9°F | Ht 68.0 in | Wt 304.5 lb

## 2015-03-15 DIAGNOSIS — K130 Diseases of lips: Secondary | ICD-10-CM

## 2015-03-15 DIAGNOSIS — Z23 Encounter for immunization: Secondary | ICD-10-CM | POA: Diagnosis not present

## 2015-03-15 MED ORDER — AMOXICILLIN-POT CLAVULANATE 875-125 MG PO TABS: 1.0000 | ORAL_TABLET | Freq: Two times a day (BID) | ORAL | Status: DC

## 2015-03-15 NOTE — Progress Notes (Signed)
Pre visit review using our clinic review tool, if applicable. No additional management support is needed unless otherwise documented below in the visit note. 

## 2015-03-15 NOTE — Assessment & Plan Note (Addendum)
Abscess in lower lip of unknown etiology He will start warm compresses Start Augmentin bid x 7 days - hopefully that will be enough to treat the infection - will try to keep antibiotics to a minimum given his history of cdiff x 2 (related to extensive antibiotics after a dental infection).  At this point I think the benefits of the antibiotics outweigh the risk of cidff. He will continue his probiotics and call if he develops any diarrhea  If there is no improvement over the weekend he will call his dentist - may need to see him or an oral surgeon for drainage  He will call with any concerns

## 2015-03-15 NOTE — Progress Notes (Signed)
   Subjective:    Patient ID: Tanner [name], male    DOB: 1946-01-21, 69 y.o.   MRN: [id number]  HPI He woke up yesterday with mild puffiness in his lower lip on the right.  The swelling worsened throughout the day yesterday and the lip was the most swollen this morning.  During the day today the swelling has decreased slightly. There is a palpable lump in the lip that is painful.  The lump has not changed in size.  There has been no drainage.  He denies any new medications, otc meds, food, products and bug bites that he is aware of.  He has not had any dental work recently and has dentures.  He denies gum pain.    He denies fevers, cold symptoms, sore throat, difficulty swallowing.  He denies numbness/tingling in face/mouth.    Medications and allergies reviewed with patient and updated if appropriate.   Review of Systems  Constitutional: Negative for fever and chills.  HENT: Negative for congestion, dental problem, ear pain, facial swelling, mouth sores, sinus pressure, sore throat and trouble swallowing.   Respiratory: Negative for cough, chest tightness, shortness of breath and wheezing.   Cardiovascular: Negative for chest pain.  Skin: Negative for rash.  Neurological: Negative for light-headedness and headaches.       Objective:   Physical Exam  Constitutional: He appears well-developed and well-nourished.  HENT:  Mouth/Throat: No oropharyngeal exudate.  Oral mucosa dry.  Lower lip swollen on right side only with a tender lump more prominent on the inside of the lip.  No drainage.  Dentures removed - no gum swelling or abscess.    Eyes: Conjunctivae are normal.  Right eye ptosis from prior bells palsy  Neck: No thyromegaly present.  Lymphadenopathy:    He has no cervical adenopathy.      Filed Vitals:   03/15/15 1329  BP: 128/68  Pulse: 72  Temp: 97.9 F (36.6 C)  TempSrc: Oral  Height: 5\' 8"  (1.727 m)  Weight: 304 lb 8 oz (138.12 kg)  SpO2: 93%         Assessment & Plan:   He is afebrile and would like to get his vaccines today.  He will receive the flu and prevnar vaccines.

## 2015-03-15 NOTE — Patient Instructions (Addendum)
You have an abscess (infection) in your lower lip.  An antibiotic was called into your pharmacy.  Take it as directed.  Apply warm compresses as often as possible to help facilitate drainage.  If no improvement over the weekend call your dentist - you may need to be referred to an oral surgeon to drain the abscess.  Continue your antibiotic.  If you experience any diarrhea call us immediately.    You have received the flu shot and pneumonia shot.     Abscess An abscess is an infected area that contains a collection of pus and debris.It can occur in almost any part of the body. An abscess is also known as a furuncle or boil. CAUSES  An abscess occurs when tissue gets infected. This can occur from blockage of oil or sweat glands, infection of hair follicles, or a minor injury to the skin. As the body tries to fight the infection, pus collects in the area and creates pressure under the skin. This pressure causes pain. People with weakened immune systems have difficulty fighting infections and get certain abscesses more often.  SYMPTOMS Usually an abscess develops on the skin and becomes a painful mass that is red, warm, and tender. If the abscess forms under the skin, you may feel a moveable soft area under the skin. Some abscesses break open (rupture) on their own, but most will continue to get worse without care. The infection can spread deeper into the body and eventually into the bloodstream, causing you to feel ill.  DIAGNOSIS  Your caregiver will take your medical history and perform a physical exam. A sample of fluid may also be taken from the abscess to determine what is causing your infection. TREATMENT  Your caregiver may prescribe antibiotic medicines to fight the infection. However, taking antibiotics alone usually does not cure an abscess. Your caregiver may need to make a small cut (incision) in the abscess to drain the pus. In some cases, gauze is packed into the abscess to reduce  pain and to continue draining the area. HOME CARE INSTRUCTIONS   Only take over-the-counter or prescription medicines for pain, discomfort, or fever as directed by your caregiver.  If you were prescribed antibiotics, take them as directed. Finish them even if you start to feel better.  If gauze is used, follow your caregiver's directions for changing the gauze.  To avoid spreading the infection:  Keep your draining abscess covered with a bandage.  Wash your hands well.  Do not share personal care items, towels, or whirlpools with others.  Avoid skin contact with others.  Keep your skin and clothes clean around the abscess.  Keep all follow-up appointments as directed by your caregiver. SEEK MEDICAL CARE IF:   You have increased pain, swelling, redness, fluid drainage, or bleeding.  You have muscle aches, chills, or a general ill feeling.  You have a fever. MAKE SURE YOU:   Understand these instructions.  Will watch your condition.  Will get help right away if you are not doing well or get worse. Document Released: 03/11/2005 Document Revised: 12/01/2011 Document Reviewed: 08/14/2011 [facility] Patient Information 2015 [place], [place]. This information is not intended to replace advice given to you by your health care provider. Make sure you discuss any questions you have with your health care provider.

## 2015-03-22 ENCOUNTER — Telehealth: Payer: Self-pay | Admitting: *Deleted

## 2015-03-22 NOTE — Telephone Encounter (Signed)
Pt left msg on triage stating saw Dr. Quay Burow couple days ago was told to let her know if sores in his mouth clear up. Pt stated yes took antibiotic that md rx feel a lot better...Tanner [name]

## 2015-04-19 ENCOUNTER — Ambulatory Visit (INDEPENDENT_AMBULATORY_CARE_PROVIDER_SITE_OTHER): Payer: Medicare HMO | Admitting: Family Medicine

## 2015-04-19 VITALS — BP 132/78 | HR 75 | Temp 98.4°F | Resp 20 | Ht 67.0 in | Wt 304.8 lb

## 2015-04-19 DIAGNOSIS — N400 Enlarged prostate without lower urinary tract symptoms: Secondary | ICD-10-CM | POA: Diagnosis not present

## 2015-04-19 DIAGNOSIS — N3001 Acute cystitis with hematuria: Secondary | ICD-10-CM

## 2015-04-19 DIAGNOSIS — N3000 Acute cystitis without hematuria: Secondary | ICD-10-CM | POA: Diagnosis not present

## 2015-04-19 LAB — POC MICROSCOPIC URINALYSIS (UMFC)

## 2015-04-19 LAB — POCT URINALYSIS DIP (MANUAL ENTRY)
BILIRUBIN UA: NEGATIVE
Bilirubin, UA: NEGATIVE
Glucose, UA: NEGATIVE
Nitrite, UA: NEGATIVE
PH UA: 8
Spec Grav, UA: 1.015
Urobilinogen, UA: 0.2

## 2015-04-19 MED ORDER — CIPROFLOXACIN HCL 250 MG PO TABS: 250.0000 mg | ORAL_TABLET | Freq: Two times a day (BID) | ORAL | Status: DC

## 2015-04-19 MED ORDER — PHENAZOPYRIDINE HCL 200 MG PO TABS: 200.0000 mg | ORAL_TABLET | Freq: Three times a day (TID) | ORAL | Status: DC | PRN

## 2015-04-19 NOTE — Patient Instructions (Signed)

## 2015-04-19 NOTE — Progress Notes (Signed)
Subjective:    Patient ID: Tanner [name], male    DOB: [birth year], 69 y.o.   MRN: [id number] This chart was scribed for Delman Cheadle, MD by Zola Button, Medical Scribe. This patient was seen in Room 10 and the patient's care was started at 2:06 PM.   Chief Complaint  Patient presents with  . Urinary Tract Infection    1 week    HPI HPI Comments: Tanner [name] is a 69 y.o. male with a history of BPH and C. Difficile colitis who presents to the [facility] complaining of burning dysuria that started 4 days ago. Patient reports having associated urinary urgency, urinary frequency, increased nocturia from baseline, and dark malodorous urine. He has been eating, drinking and making bowel movements without problems. Patient denies fever, chills, groin rash, groin pain, penile discharge, flank pain, worsening of back pain from baseline, nausea, vomiting, diarrhea, and constipation. He also denies history of kidney stones and other kidney problems. He was put on Augmentin about a month ago for lip abscess.  Past Medical History  Diagnosis Date  . Hypertension   . Depression   . Neuromuscular disorder ([place])   . Back pain   . Low back pain potentially associated with spinal stenosis   . Neuropathy of lower extremity     bilateral  . Clostridium difficile infection   . Ventral hernia   . DVT (deep venous thrombosis) ([place])   . PE (pulmonary embolism)   . OSA (obstructive sleep apnea)   . BPH (benign prostatic hypertrophy)   . Acute kidney failure ([place])   . Arthritis   . Clotting disorder [facility])    Current Outpatient Prescriptions on File Prior to Visit  Medication Sig Dispense Refill  . acetaminophen (TYLENOL) 500 MG tablet Take 1,000 mg by mouth as needed.    Marland Kitchen aspirin 81 MG tablet Take 162 mg by mouth daily.    Marland Kitchen atenolol (TENORMIN) 25 MG tablet Take 12.5 mg by mouth daily.    Marland Kitchen buPROPion (WELLBUTRIN SR) 150 MG 12 hr tablet Take 150 mg by mouth 2 (two) times daily.    .  finasteride (PROSCAR) 5 MG tablet Take 5 mg by mouth at bedtime.    . furosemide (LASIX) 20 MG tablet Take 20 mg by mouth every other day.    . gabapentin (NEURONTIN) 300 MG capsule 600 mg. Take 3 tablets twice daily    . hydrochlorothiazide (HYDRODIURIL) 25 MG tablet Take 25 mg by mouth daily.    Marland Kitchen ketoconazole (NIZORAL) 2 % shampoo Apply topically daily.    . Menthol-Methyl Salicylate (THERA-GESIC PLUS) CREA Apply 1 application topically at bedtime. Apply to lower back    . Multiple Vitamins-Minerals (MULTIVITAMINS THER. W/MINERALS) TABS Take 1 tablet by mouth daily.    . potassium chloride SA (K-DUR,KLOR-CON) 20 MEQ tablet Take 20 mEq by mouth as needed.    . saccharomyces boulardii (FLORASTOR) 250 MG capsule Take 1 capsule (250 mg total) by mouth 2 (two) times daily. 60 capsule 1  . traMADol (ULTRAM) 50 MG tablet Take 100 mg by mouth 3 (three) times daily. Takes 2 tabs    . zolpidem (AMBIEN) 10 MG tablet Take 1 tablet (10 mg total) by mouth at bedtime. For sleep 30 tablet 0  . diphenoxylate-atropine (LOMOTIL) 2.5-0.025 MG per tablet Take 1 tablet by mouth as needed for diarrhea or loose stools.     No current facility-administered medications on file prior to visit.   Allergies  Allergen Reactions  . Codeine Sulfate Itching and Nausea Only     Review of Systems  Constitutional: Negative for fever and chills.  Gastrointestinal: Negative for nausea, vomiting, diarrhea and constipation.  Genitourinary: Positive for dysuria, urgency and frequency. Negative for flank pain, discharge and genital sores.  Skin: Negative for rash.       Objective:  BP 132/78 mmHg  Pulse 75  Temp(Src) 98.4 F (36.9 C) (Oral)  Resp 20  Ht 5\' 7"  (1.702 m)  Wt 304 lb 12.8 oz (138.256 kg)  BMI 47.73 kg/m2  SpO2 94%  Physical Exam  Constitutional: He is oriented to person, place, and time. He appears well-developed and well-nourished. No distress.  HENT:  Head: Normocephalic and atraumatic.    Mouth/Throat: Oropharynx is clear and moist. No oropharyngeal exudate.  Eyes: Pupils are equal, round, and reactive to light.  Neck: Neck supple.  Cardiovascular: Normal rate.   Distant heart sounds due to body habitus.  Pulmonary/Chest: Effort normal.  Abdominal: Soft. Bowel sounds are normal. He exhibits distension. A hernia is present. Hernia confirmed positive in the ventral area.  Organomegaly not palpable due to body habitus. Positive ventral hernias, diastasis recti, and question of lipomas in abdominal wall.  Musculoskeletal: He exhibits no edema.  Neurological: He is alert and oriented to person, place, and time. No cranial nerve deficit.  Skin: Skin is warm and dry. No rash noted.  Psychiatric: He has a normal mood and affect. His behavior is normal.  Nursing note and vitals reviewed.   Results for orders placed or performed in visit on 04/19/15  POCT urinalysis dipstick  Result Value Ref Range   Color, UA yellow yellow   Clarity, UA clear clear   Glucose, UA negative negative   Bilirubin, UA negative negative   Ketones, POC UA negative negative   Spec Grav, UA 1.015    Blood, UA trace-intact (A) negative   pH, UA 8.0    Protein Ur, POC trace (A) negative   Urobilinogen, UA 0.2    Nitrite, UA Negative Negative   Leukocytes, UA Trace (A) Negative  POCT Microscopic Urinalysis ([facility])  Result Value Ref Range   WBC,UR,HPF,POC Few (A) None WBC/hpf   RBC,UR,HPF,POC Moderate (A) None RBC/hpf   Bacteria Moderate (A) None, Too numerous to count   Mucus Present (A) Absent   Epithelial Cells, UR Per Microscopy Few (A) None, Too numerous to count cells/hpf         Assessment & Plan:   1. BPH (benign prostatic hyperplasia)   2. Acute cystitis with hematuria     Orders Placed This Encounter  Procedures  . Urine culture  . POCT urinalysis dipstick  . POCT Microscopic Urinalysis ([facility])    Meds ordered this encounter  Medications  . ciprofloxacin (CIPRO) 250 MG  tablet    Sig: Take 1 tablet (250 mg total) by mouth 2 (two) times daily.    Dispense:  14 tablet    Refill:  0  . phenazopyridine (PYRIDIUM) 200 MG tablet    Sig: Take 1 tablet (200 mg total) by mouth 3 (three) times daily as needed (bladder pain).    Dispense:  10 tablet    Refill:  0    I personally performed the services described in this documentation, which was scribed in my presence. The recorded information has been reviewed and considered, and addended by me as needed.  Delman Cheadle, MD MPH   By signing my name below, I, Zola Button, attest that  this documentation has been prepared under the direction and in the presence of Delman Cheadle, MD.  Electronically Signed: Zola Button, Medical Scribe. 04/19/2015. 2:06 PM.

## 2015-04-22 LAB — URINE CULTURE: Colony Count: >=100000

## 2015-05-03 ENCOUNTER — Encounter: Payer: Self-pay | Admitting: Internal Medicine

## 2015-10-11 DIAGNOSIS — Z0000 Encounter for general adult medical examination without abnormal findings: Secondary | ICD-10-CM | POA: Diagnosis not present

## 2015-10-11 DIAGNOSIS — E7800 Pure hypercholesterolemia, unspecified: Secondary | ICD-10-CM | POA: Diagnosis not present

## 2015-10-11 DIAGNOSIS — Z6841 Body Mass Index (BMI) 40.0 and over, adult: Secondary | ICD-10-CM | POA: Diagnosis not present

## 2015-10-11 DIAGNOSIS — I10 Essential (primary) hypertension: Secondary | ICD-10-CM | POA: Diagnosis not present

## 2016-01-29 NOTE — Progress Notes (Signed)
[name] [address], [place] [address] Phone: [phone] Subjective:    I'm seeing this patient by the request  of:  Hoyt Koch, MD   CC: Left knee pain  [id number]  Tanner [name] is a 70 y.o. male coming in with complaint of left knee pain. Patient has a past medical history significant for left leg DVT. Patient states he is having more of the left knee pain. Was doing some mild increase in activity. Last 2 days started having swelling of the leg. Was unable to move the knee very well. Overall starting to improve a little bit. Walking with the aid of a cane. Rates the severity of pain a 6 out of 10 but initially was 9 out of 10. She did have a fall a proximal a one month ago that could've been contributing he states.     Past Medical History:  Diagnosis Date  . Acute kidney failure ([place])   . Arthritis   . Back pain   . BPH (benign prostatic hypertrophy)   . Clostridium difficile infection   . Clotting disorder ([place])   . Depression   . DVT (deep venous thrombosis) ([place])   . Hypertension   . Low back pain potentially associated with spinal stenosis   . Neuromuscular disorder ([place])   . Neuropathy of lower extremity    bilateral  . OSA (obstructive sleep apnea)   . PE (pulmonary embolism)   . Ventral hernia    Past Surgical History:  Procedure Laterality Date  . EYE SURGERY    . HERNIA REPAIR     Social History   Social History  . Marital status: Married    Spouse name: N/A  . Number of children: 2  . Years of education: N/A   Occupational History  . Retired    Social History Main Topics  . Smoking status: Former Smoker    Packs/day: 2.00    Years: 33.00    Types: Cigarettes    Start date: 08/30/1968    Quit date: 07/02/2001  . Smokeless tobacco: Never Used     Comment: quit 12 years ago  . Alcohol use Yes     Comment: occasional  . Drug use: No  . Sexual activity: Not Currently   Other Topics  Concern  . Not on file   Social History Narrative   HSG   [employer]-2 years   Married '66    2 sons '68, '72; 3 grandchildren   Sales-petroleum equipment sales    Allergies  Allergen Reactions  . Codeine Sulfate Itching and Nausea Only   Family History  Problem Relation Age of Onset  . Diabetes Mother   . Pneumonia Mother   . Alzheimer's disease Mother   . Colon cancer Neg Hx   . Colon polyps Neg Hx     Past medical history, social, surgical and family history all reviewed in electronic medical record.  No pertanent information unless stated regarding to the chief complaint.   Review of Systems: No headache, visual changes, nausea, vomiting, diarrhea, constipation, dizziness, abdominal pain, skin rash, fevers, chills, night sweats, weight loss, swollen lymph nodes, body aches, joint swelling, muscle aches, chest pain, shortness of breath, mood changes.   Objective  There were no vitals taken for this visit.  General: No apparent distress alert and oriented x3 mood and affect normal, dressed appropriately.  HEENT: Pupils equal, extraocular movements intact  Respiratory: Patient's speak in full sentences  and does not appear short of breath  Cardiovascular: No lower extremity edema, non tender, no erythema  Skin: Warm dry intact with no signs of infection or rash on extremities or on axial skeleton.  Abdomen: Soft nontender  Neuro: Cranial nerves II through XII are intact, neurovascularly intact in all extremities with 2+ DTRs and 2+ pulses.  Lymph: No lymphadenopathy of posterior or anterior cervical chain or axillae bilaterally.  Gait antalgic gait MSK:  Non tender with full range of motion and good stability and symmetric strength and tone of shoulders, elbows, wrist, hip and ankles bilaterally.  Knee:left  Distal to assess secondary to patient's body habitus. Large thigh to calf ratio noted.. Pain mostly over the medial joint line as well as the patellofemoral joint Lacks last 5  degrees of flexion and extension.  Ligaments with solid consistent endpoints including ACL, PCL, LCL, MCL. Negative Mcmurray's, Apley's, and Thessalonian tests. painful patellar compression. Patellar glide with moderate crepitus. Patellar and quadriceps tendons unremarkable. Hamstring and quadriceps strength is normal.  Difficult to assess secondary to patient's body habitus but no pain.  MSK US performed of: left  This study was ordered, performed, and interpreted by Charlann Boxer D.O.  Knee: All structures visualized. Narrowing of the patellofemoral joint. Patient does have what appears to be a chronic nonhealing fracture on the lateral aspect of the patella. No significant displacement. Hypoechoic changes noted. Increasing Doppler flow noted. Patient is a some mild degenerative changes of the medial and lateral joint space. Patellar Tendon unremarkable on long and transverse views without effusion. No abnormality of prepatellar bursa.  IMPRESSION:  Patellofemoral arthritis    After informed written and verbal consent, patient was seated on exam table. Left knee was prepped with alcohol swab and utilizing anterolateral approach, patient's left knee space was injected with 4:1  marcaine 0.5%: Kenalog 40mg /dL. Patient tolerated the procedure well without immediate complications. Impression and Recommendations:     This case required medical decision making of moderate complexity.      Note: This dictation was prepared with Dragon dictation along with smaller phrase technology. Any transcriptional errors that result from this process are unintentional.

## 2016-01-30 ENCOUNTER — Other Ambulatory Visit: Payer: Self-pay

## 2016-01-30 ENCOUNTER — Encounter: Payer: Self-pay | Admitting: Family Medicine

## 2016-01-30 ENCOUNTER — Ambulatory Visit (INDEPENDENT_AMBULATORY_CARE_PROVIDER_SITE_OTHER): Payer: Medicare HMO | Admitting: Family Medicine

## 2016-01-30 VITALS — BP 118/80 | HR 84 | Wt 304.0 lb

## 2016-01-30 DIAGNOSIS — M25562 Pain in left knee: Secondary | ICD-10-CM

## 2016-01-30 DIAGNOSIS — M1712 Unilateral primary osteoarthritis, left knee: Secondary | ICD-10-CM | POA: Diagnosis not present

## 2016-01-30 DIAGNOSIS — Z6841 Body Mass Index (BMI) 40.0 and over, adult: Secondary | ICD-10-CM | POA: Insufficient documentation

## 2016-01-30 NOTE — Assessment & Plan Note (Signed)
Patient given injection today and tolerated the procedure well. We discussed icing regimen and home exercises. We discussed avoiding certain activities. Patient could be a candidate for custom brace as well as viscous supple mentation if he continues. We did discuss with him to watch for swelling with him having a history of having a blood clot but this does not have any redness, any swelling that is out of his ordinary at the moment. Same size as other leg.  RTC in 4 weeks.

## 2016-01-30 NOTE — Patient Instructions (Signed)
Great to see you  Ice 20 minutes 2 times daily. Usually after activity and before bed. Exercises 3 times a week.  pennsaid pinkie amount topically 2 times daily as needed.  Avoid twisting motions See me again in 4 weeks to [facility] sure you are doing better.

## 2016-02-26 ENCOUNTER — Ambulatory Visit (INDEPENDENT_AMBULATORY_CARE_PROVIDER_SITE_OTHER): Payer: Medicare HMO | Admitting: Family Medicine

## 2016-02-26 ENCOUNTER — Encounter: Payer: Self-pay | Admitting: Family Medicine

## 2016-02-26 VITALS — BP 116/72 | HR 69 | Wt 304.0 lb

## 2016-02-26 DIAGNOSIS — Z23 Encounter for immunization: Secondary | ICD-10-CM | POA: Diagnosis not present

## 2016-02-26 DIAGNOSIS — M1712 Unilateral primary osteoarthritis, left knee: Secondary | ICD-10-CM | POA: Diagnosis not present

## 2016-02-26 NOTE — Assessment & Plan Note (Signed)
Patient did not have any significant improvement with the cortical steroid injection. Patient has failed all other conservative therapy and elected try viscous supplementation. Patient has tramadol for breakthrough pain. We discussed icing regimen and home exercises. Patient declined a custom brace at this moment. Failed formal physical therapy previously and does not want to repeat it at this time. Follow-up again in 6 weeks. In 6 weeks if worsening pain we'll consider injection in the knee.

## 2016-02-26 NOTE — Patient Instructions (Signed)
Good to see you  Ice 20 minutes 2 times daily. Usually after activity and before bed. We did monovisc today that should help Keep trying to be active.  Good shoes and weight loss can help See me again in 6 weeks to make sure you are doing well and if needed we will repeat the steroid shot.

## 2016-02-26 NOTE — Progress Notes (Signed)
[name] [address], [place] [address] Phone: [phone] Subjective:    I'm seeing this patient by the request  of:  Hoyt Koch, MD   CC: Left knee pain Follow-up  [id number]  Tanner [name] is a 70 y.o. male coming in with complaint of left knee pain. Patient has a past medical history significant for left leg DVT. Patient states he is having more of the left knee pain.  Patient was seen one month ago. Patient was given injections in the left knee for the underlying arthritis. Patient states that for 2 weeks he is approximately 80% better and he is still 20% better than prior to visit. Unfortunately though continues have pain and some instability. Has done multiple different things for this over the course of time. Once seems very is anything else that can be done other than surgical intervention.     Past Medical History:  Diagnosis Date  . Acute kidney failure ([place])   . Arthritis   . Back pain   . BPH (benign prostatic hypertrophy)   . Clostridium difficile infection   . Clotting disorder ([place])   . Depression   . DVT (deep venous thrombosis) ([place])   . Hypertension   . Low back pain potentially associated with spinal stenosis   . Neuromuscular disorder ([place])   . Neuropathy of lower extremity    bilateral  . OSA (obstructive sleep apnea)   . PE (pulmonary embolism)   . Ventral hernia    Past Surgical History:  Procedure Laterality Date  . EYE SURGERY    . HERNIA REPAIR     Social History   Social History  . Marital status: Married    Spouse name: N/A  . Number of children: 2  . Years of education: N/A   Occupational History  . Retired    Social History Main Topics  . Smoking status: Former Smoker    Packs/day: 2.00    Years: 33.00    Types: Cigarettes    Start date: 08/30/1968    Quit date: 07/02/2001  . Smokeless tobacco: Never Used     Comment: quit 12 years ago  . Alcohol use Yes     Comment:  occasional  . Drug use: No  . Sexual activity: Not Currently   Other Topics Concern  . None   Social History Narrative   HSG   [employer]-2 years   Married '66    2 sons '68, '72; 3 grandchildren   Sales-petroleum equipment sales    Allergies  Allergen Reactions  . Codeine Sulfate Itching and Nausea Only   Family History  Problem Relation Age of Onset  . Diabetes Mother   . Pneumonia Mother   . Alzheimer's disease Mother   . Colon cancer Neg Hx   . Colon polyps Neg Hx     Past medical history, social, surgical and family history all reviewed in electronic medical record.  No pertanent information unless stated regarding to the chief complaint.   Review of Systems: No headache, visual changes, nausea, vomiting, diarrhea, constipation, dizziness, abdominal pain, skin rash, fevers, chills, night sweats, weight loss, swollen lymph nodes, body aches, joint swelling, muscle aches, chest pain, shortness of breath, mood changes.   Objective  Blood pressure 116/72, weight (!) 304 lb (137.9 kg).  General: No apparent distress alert and oriented x3 mood and affect normal, dressed appropriately.  HEENT: Pupils equal, extraocular movements intact  Respiratory: Patient's speak in  full sentences and does not appear short of breath  Cardiovascular: No lower extremity edema, non tender, no erythema  Skin: Warm dry intact with no signs of infection or rash on extremities or on axial skeleton.  Abdomen: Soft nontender  Neuro: Cranial nerves II through XII are intact, neurovascularly intact in all extremities with 2+ DTRs and 2+ pulses.  Lymph: No lymphadenopathy of posterior or anterior cervical chain or axillae bilaterally.  Gait antalgic gait MSK:  Non tender with full range of motion and good stability and symmetric strength and tone of shoulders, elbows, wrist, hip and ankles bilaterally.  Knee:left  Distal to assess secondary to patient's body habitus. Large thigh to calf ratio noted.. Pain  mostly over the medial joint line as well as the patellofemoral joint Lacks last 5 degrees of flexion and extension.  Instability with valgus force painful patellar compression. Patellar glide with moderate crepitus. Patellar and quadriceps tendons unremarkable. Hamstring and quadriceps strength is normal.  Difficult to assess secondary to patient's body habitus but no pain. Mild worsening from previous exam.    After informed written and verbal consent, patient was seated on exam table. Left knee was prepped with alcohol swab and utilizing anterolateral approach, patient's left knee space was injected with 22 mg/1 mL of Monovisc (sodium hyaluronate) in a prefilled syringe was injected easily into the knee through a 22-gauge needle.. Patient tolerated the procedure well without immediate complications. Impression and Recommendations:     This case required medical decision making of moderate complexity.      Note: This dictation was prepared with Dragon dictation along with smaller phrase technology. Any transcriptional errors that result from this process are unintentional.

## 2016-04-07 ENCOUNTER — Ambulatory Visit: Payer: Medicare HMO | Admitting: Family Medicine

## 2016-04-08 NOTE — Progress Notes (Signed)
[name] [address] [place], [place] [address] Phone: [phone] Subjective:    I'm seeing this patient by the request  of:  Hoyt Koch, MD   CC: Left knee pain Follow-up  [id number]  Tanner [name] is a 70 y.o. male coming in with complaint of left knee pain. Patient has a past medical history significant for left leg DVT. Patient states he is having more of the left knee pain.  Patient was seen 6 weeks ago and was given viscous supplementation after failing all other conservative therapy. Patient was to continue all other conservative therapy. Patient states He is doing about 70% better. Would state that he does not have pain but continues to have discomfort. Patient denies any increasing instability. Patient states that he is able to do daily activities. Patient is the primary caregiver for his ailing wife that sometimes seems to aggravate the knee.     Past Medical History:  Diagnosis Date  . Acute kidney failure ([place])   . Arthritis   . Back pain   . BPH (benign prostatic hypertrophy)   . Clostridium difficile infection   . Clotting disorder ([place])   . Depression   . DVT (deep venous thrombosis) ([place])   . Hypertension   . Low back pain potentially associated with spinal stenosis   . Neuromuscular disorder ([place])   . Neuropathy of lower extremity    bilateral  . OSA (obstructive sleep apnea)   . PE (pulmonary embolism)   . Ventral hernia    Past Surgical History:  Procedure Laterality Date  . EYE SURGERY    . HERNIA REPAIR     Social History   Social History  . Marital status: Married    Spouse name: Tanner [name]  . Number of children: 2  . Years of education: Tanner [name]   Occupational History  . Retired    Social History Main Topics  . Smoking status: Former Smoker    Packs/day: 2.00    Years: 33.00    Types: Cigarettes    Start date: 08/30/1968    Quit date: 07/02/2001  . Smokeless tobacco: Never Used     Comment: quit 12  years ago  . Alcohol use Yes     Comment: occasional  . Drug use: No  . Sexual activity: Not Currently   Other Topics Concern  . Not on file   Social History Narrative   HSG   [employer]-2 years   Married '66    2 sons '68, '72; 3 grandchildren   Sales-petroleum equipment sales    Allergies  Allergen Reactions  . Codeine Sulfate Itching and Nausea Only   Family History  Problem Relation Age of Onset  . Diabetes Mother   . Pneumonia Mother   . Alzheimer's disease Mother   . Colon cancer Neg Hx   . Colon polyps Neg Hx     Past medical history, social, surgical and family history all reviewed in electronic medical record.  No pertanent information unless stated regarding to the chief complaint.   Review of Systems: No headache, visual changes, nausea, vomiting, diarrhea, constipation, dizziness, abdominal pain, skin rash, fevers, chills, night sweats, weight loss, swollen lymph nodes, , chest pain, shortness of breath, mood changes.   Objective  There were no vitals taken for this visit.  General: No apparent distress alert and oriented x3 mood and affect normal, dressed appropriately.  HEENT: Pupils equal, extraocular movements intact mild lazy eye on right  Respiratory: Patient's speak in full sentences and does not appear short of breath  Cardiovascular: No lower extremity edema, non tender, no erythema  Skin: Warm dry intact with no signs of infection or rash on extremities or on axial skeleton.  Abdomen: Soft nontender  Neuro: Cranial nerves II through XII are intact, neurovascularly intact in all extremities with 2+ DTRs and 2+ pulses.  Lymph: No lymphadenopathy of posterior or anterior cervical chain or axillae bilaterally.  Gait antalgic gait still present.  MSK:  Non tender with full range of motion and good stability and symmetric strength and tone of shoulders, elbows, wrist, hip and ankles bilaterally. Arthritic changes of some joints.  Knee:left  Difficult to assess  secondary to patient's body habitus. Large thigh to calf ratio noted.. Continued discomfort over the medial and patellofemoral joints Instability with valgus force painful patellar compression. Patellar glide with mild itus. Patellar and quadriceps tendons unremarkable. Hamstring and quadriceps strength is normal.  Contralateral knee has degenerative changes but no pain.       Impression and Recommendations:     This case required medical decision making of moderate complexity.      Note: This dictation was prepared with Dragon dictation along with smaller phrase technology. Any transcriptional errors that result from this process are unintentional.

## 2016-04-09 ENCOUNTER — Ambulatory Visit (INDEPENDENT_AMBULATORY_CARE_PROVIDER_SITE_OTHER): Payer: Medicare HMO | Admitting: Family Medicine

## 2016-04-09 ENCOUNTER — Encounter: Payer: Self-pay | Admitting: Family Medicine

## 2016-04-09 DIAGNOSIS — M1712 Unilateral primary osteoarthritis, left knee: Secondary | ICD-10-CM

## 2016-04-09 NOTE — Assessment & Plan Note (Signed)
Patient does have some degenerative changes of the left knee. Patient is doing better after the viscous supplementation. Encourage weight loss. Patient declined formal physical therapy again at this time. Patient also does not want a custom brace yet. Follow-up as needed.

## 2016-04-09 NOTE — Patient Instructions (Signed)
I aglad we are doing better Overall keep trucking a long Call me if you want the brace If pain worsens we can try the steroid injection again  Need to wait until March for the monovisc again  See me again when you need me

## 2016-06-23 ENCOUNTER — Other Ambulatory Visit (INDEPENDENT_AMBULATORY_CARE_PROVIDER_SITE_OTHER): Payer: Medicare HMO

## 2016-06-23 ENCOUNTER — Ambulatory Visit (INDEPENDENT_AMBULATORY_CARE_PROVIDER_SITE_OTHER): Payer: Medicare HMO | Admitting: Physician Assistant

## 2016-06-23 ENCOUNTER — Ambulatory Visit: Payer: Medicare HMO | Admitting: Nurse Practitioner

## 2016-06-23 ENCOUNTER — Encounter: Payer: Self-pay | Admitting: Physician Assistant

## 2016-06-23 VITALS — BP 124/70 | HR 76 | Ht 66.0 in | Wt 292.0 lb

## 2016-06-23 DIAGNOSIS — Z8619 Personal history of other infectious and parasitic diseases: Secondary | ICD-10-CM | POA: Diagnosis not present

## 2016-06-23 DIAGNOSIS — R197 Diarrhea, unspecified: Secondary | ICD-10-CM

## 2016-06-23 LAB — CBC WITH DIFFERENTIAL/PLATELET
BASOS ABS: 0.1 10*3/uL (ref 0.0–0.1)
Basophils Relative: 0.6 % (ref 0.0–3.0)
EOS ABS: 0.2 10*3/uL (ref 0.0–0.7)
Eosinophils Relative: 2.2 % (ref 0.0–5.0)
HEMATOCRIT: 43.5 % (ref 39.0–52.0)
HEMOGLOBIN: 14.6 g/dL (ref 13.0–17.0)
LYMPHS PCT: 22 % (ref 12.0–46.0)
Lymphs Abs: 2.3 10*3/uL (ref 0.7–4.0)
MCHC: 33.6 g/dL (ref 30.0–36.0)
MCV: 87.7 fl (ref 78.0–100.0)
MONO ABS: 0.6 10*3/uL (ref 0.1–1.0)
Monocytes Relative: 6.2 % (ref 3.0–12.0)
Neutro Abs: 7.2 10*3/uL (ref 1.4–7.7)
Neutrophils Relative %: 69 % (ref 43.0–77.0)
Platelets: 233 10*3/uL (ref 150.0–400.0)
RBC: 4.96 Mil/uL (ref 4.22–5.81)
RDW: 15.7 % — AB (ref 11.5–15.5)
WBC: 10.4 10*3/uL (ref 4.0–10.5)

## 2016-06-23 NOTE — Progress Notes (Signed)
Subjective:    Patient ID: Tanner [name], male    DOB: [birth year], 71 y.o.   MRN: [id number]  HPI Tanner [name] is a pleasant 71 year old white male known previously to Dr. Henrene Pastor. He has not been seen in our office in the past couple of years. He did have colonoscopy done in May 2016 at the [facility] and had 5 sessile polyps removed. Is also noted to have left colon diverticulosis. I do not have access to his path report but he was told to have follow-up in 3 years. Patient had an admission in July 2015 with C. difficile colitis which responded to vancomycin He says he has had one other episode of milder C. difficile colitis as well. Comes in today with 5 week history of diarrhea. He has not been on any recent antibiotics, and denies any new medication supplements etc. He has not had any known infectious exposures, no recent travel etc. He says he feels like he felt early on with the C. difficile previously. He is having 8-9 bowel movements per 24 hours. He says usually has several bowel movements early in the morning none of his stools are formed. He says he will usually get up at least once in the middle of the night with diarrhea as well. He is also had urgency. He has not noted any melena or hematochezia, no fever or chills. No complaints of abdominal pain or cramping and appetite has been fine. Does admit to being fatigued.  Review of Systems Pertinent positive and negative review of systems were noted in the above HPI section.  All other review of systems was otherwise negative.  Outpatient Encounter Prescriptions as of 06/23/2016  Medication Sig  . acetaminophen (TYLENOL) 500 MG tablet Take 1,000 mg by mouth as needed.  Marland Kitchen aspirin 81 MG tablet Take 162 mg by mouth daily.  Marland Kitchen atenolol (TENORMIN) 25 MG tablet Take 12.5 mg by mouth daily.  Marland Kitchen buPROPion (WELLBUTRIN SR) 150 MG 12 hr tablet Take 150 mg by mouth 2 (two) times daily.  . diphenoxylate-atropine (LOMOTIL) 2.5-0.025 MG per tablet Take 1 tablet  by mouth as needed for diarrhea or loose stools.  . finasteride (PROSCAR) 5 MG tablet Take 5 mg by mouth at bedtime.  . furosemide (LASIX) 20 MG tablet Take 20 mg by mouth every other day.  . gabapentin (NEURONTIN) 300 MG capsule 600 mg. Take 3 tablets twice daily  . hydrochlorothiazide (HYDRODIURIL) 25 MG tablet Take 25 mg by mouth daily.  Marland Kitchen ketoconazole (NIZORAL) 2 % shampoo Apply topically daily.  . Menthol-Methyl Salicylate (THERA-GESIC PLUS) CREA Apply 1 application topically at bedtime. Apply to lower back  . Multiple Vitamins-Minerals (MULTIVITAMINS THER. W/MINERALS) TABS Take 1 tablet by mouth daily.  . potassium chloride SA (K-DUR,KLOR-CON) 20 MEQ tablet Take 20 mEq by mouth as needed.  . traMADol (ULTRAM) 50 MG tablet Take 100 mg by mouth 3 (three) times daily. Takes 2 tabs  . zolpidem (AMBIEN) 10 MG tablet Take 1 tablet (10 mg total) by mouth at bedtime. For sleep   No facility-administered encounter medications on file as of 06/23/2016.    Allergies  Allergen Reactions  . Codeine Sulfate Itching and Nausea Only   Patient Active Problem List   Diagnosis Date Noted  . Degenerative arthritis of left knee 01/30/2016  . Morbid obesity ([place]) 01/30/2016  . Lip abscess 03/15/2015  . Routine general medical examination at a health care facility 04/20/2014  . C. difficile colitis 12/19/2013  . History of  pulmonary embolus (PE) 12/19/2013  . Subacromial bursitis 05/10/2013  . INSOMNIA UNSPECIFIED 09/16/2007  . ANXIETY 03/29/2007  . DEPRESSION 03/29/2007  . OBSTRUCTIVE SLEEP APNEA 03/29/2007  . GLAUCOMA NOS 03/29/2007  . BPH (benign prostatic hyperplasia) 03/29/2007  . OSTEOARTHRITIS, KNEE 03/29/2007  . ELEVATED BP W/O HYPERTENSION 03/29/2007  . BELL'S PALSY, HX OF 03/29/2007   Social History   Social History  . Marital status: Married    Spouse name: N/A  . Number of children: 2  . Years of education: N/A   Occupational History  . Retired    Social History Main Topics    . Smoking status: Former Smoker    Packs/day: 2.00    Years: 33.00    Types: Cigarettes    Start date: 08/30/1968    Quit date: 07/02/2001  . Smokeless tobacco: Never Used     Comment: quit 12 years ago  . Alcohol use Yes     Comment: occasional  . Drug use: No  . Sexual activity: Not Currently   Other Topics Concern  . Not on file   Social History Narrative   HSG   [employer]-2 years   Married '66    2 sons '68, '72; 3 grandchildren   Sales-petroleum equipment sales     Mr. Seliga family history includes Alzheimer's disease in his mother; Diabetes in his mother; Pneumonia in his mother.      Objective:    Vitals:   06/23/16 1345  BP: 124/70  Pulse: 76    Physical Exam     well-developed older white male in no acute distress, pleasant blood pressure 124/70 pulse 76, BMI 47.1. HEENT; nontraumatic normocephalic EOMI PERRLA sclera anicteric Cardiovascular; regular rate and rhythm with S1-S2 no murmur or gallop, Pulmonary; clear bilaterally, Abdomen; obese soft nontender nondistended bowel sounds are active there is no palpable mass or hepatosplenomegaly, Rectal; exam not done, Extremities; no clubbing cyanosis or edema skin warm and dry, Neuropsych ;mood and affect appropriate       Assessment & Plan:   #64 71 year old white male with 4-5 week history of diarrhea with several bowel movements per day. Otherwise relatively asymptomatic. Patient does have previous history of C. difficile colitis in 2015.  Rule out infectious etiologies i.e. recurrent C. difficile colitis  #2 history of colon polyps, 5 sessile polyps removed at last colonoscopy 2016 done at the [facility] probably adenomatous , and was slated for 3 year interval follow-up #3 morbid obesity #4 sleep apnea #5 previous history of pulmonary embolism  Plan; CBC with differential, CMET, GI pathogen panel and stool for C. difficile by PCR Patient was encouraged to return in the stool specimens as soon as possible and  we'll hold on any medications until stool studies have resulted. He will use Imodium on a when necessary basis in the interim.    Jonus Coble S Zohal Reny PA-C 06/23/2016   Cc: Hoyt Koch, *

## 2016-06-23 NOTE — Progress Notes (Signed)
Initial assessment and plan as noted. Amy to follow-up on testing

## 2016-06-23 NOTE — Patient Instructions (Signed)
Please go to the basement level to have your labs drawn and stool studies.

## 2016-06-24 ENCOUNTER — Other Ambulatory Visit: Payer: Medicare HMO

## 2016-06-24 DIAGNOSIS — Z8619 Personal history of other infectious and parasitic diseases: Secondary | ICD-10-CM

## 2016-06-24 DIAGNOSIS — R197 Diarrhea, unspecified: Secondary | ICD-10-CM

## 2016-06-25 ENCOUNTER — Other Ambulatory Visit: Payer: Self-pay

## 2016-06-25 LAB — CLOSTRIDIUM DIFFICILE BY PCR: CDIFFPCR: DETECTED — AB

## 2016-06-25 MED ORDER — VANCOMYCIN HCL 250 MG PO CAPS: 250.0000 mg | ORAL_CAPSULE | Freq: Four times a day (QID) | ORAL | 0 refills | Status: DC

## 2016-06-26 ENCOUNTER — Telehealth: Payer: Self-pay | Admitting: Nurse Practitioner

## 2016-06-26 NOTE — Telephone Encounter (Signed)
Pt's wife called back and they had the script transferred to another pharmacy and would like to cancel this message out

## 2016-06-29 NOTE — Telephone Encounter (Signed)
Per Webb Laws, the patient got the Vancomycin at another pharmacy and they do not need [name] to do anything with it. They were able to get the medication.

## 2016-06-30 LAB — GASTROINTESTINAL PATHOGEN PANEL PCR
C. DIFFICILE TOX A/B, PCR: DETECTED — AB
CAMPYLOBACTER, PCR: NOT DETECTED
CRYPTOSPORIDIUM, PCR: NOT DETECTED
E COLI (STEC) STX1/STX2, PCR: NOT DETECTED
E coli (ETEC) LT/ST PCR: NOT DETECTED
E coli 0157, PCR: NOT DETECTED
GIARDIA LAMBLIA, PCR: NOT DETECTED
NOROVIRUS, PCR: NOT DETECTED
ROTAVIRUS, PCR: NOT DETECTED
Salmonella, PCR: NOT DETECTED
Shigella, PCR: NOT DETECTED

## 2016-07-03 ENCOUNTER — Telehealth: Payer: Self-pay | Admitting: Physician Assistant

## 2016-07-03 NOTE — Telephone Encounter (Signed)
This has been addressed and the patient has been treated for cdiff

## 2016-08-12 NOTE — Progress Notes (Signed)
[name] [address], [place] [address] Phone: [phone] Subjective:    I'm seeing this patient by the request  of:  Hoyt Koch, MD   CC: Left knee pain Follow-up  [id number]  Tanner [name] is a 71 y.o. male coming in with complaint of left knee pain. Patient has a past medical history significant for left leg DVT. Patient was last seen nearly 6 months ago after viscous supplementation was doing approximate 70% better. Patient states Certain worsening symptoms again over the course last several weeks. Patient states that making a more difficult to do daily activities. Patient states sometimes having increasing instability as well. Sometimes wakes him up at night.     Past Medical History:  Diagnosis Date  . Acute kidney failure ([place])   . Arthritis   . Back pain   . BPH (benign prostatic hypertrophy)   . Clostridium difficile infection   . Clotting disorder ([place])   . Depression   . DVT (deep venous thrombosis) ([place])   . Hypertension   . Low back pain potentially associated with spinal stenosis   . Neuromuscular disorder ([place])   . Neuropathy of lower extremity    bilateral  . OSA (obstructive sleep apnea)   . PE (pulmonary embolism)   . Ventral hernia    Past Surgical History:  Procedure Laterality Date  . EYE SURGERY    . HERNIA REPAIR     Social History   Social History  . Marital status: Married    Spouse name: N/A  . Number of children: 2  . Years of education: N/A   Occupational History  . Retired    Social History Main Topics  . Smoking status: Former Smoker    Packs/day: 2.00    Years: 33.00    Types: Cigarettes    Start date: 08/30/1968    Quit date: 07/02/2001  . Smokeless tobacco: Never Used     Comment: quit 12 years ago  . Alcohol use Yes     Comment: occasional  . Drug use: No  . Sexual activity: Not Currently   Other Topics Concern  . None   Social History Narrative   HSG   [employer]-2 years   Married '66    2 sons '68, '72; 3 grandchildren   Sales-petroleum equipment sales    Allergies  Allergen Reactions  . Codeine Sulfate Itching and Nausea Only   Family History  Problem Relation Age of Onset  . Diabetes Mother   . Pneumonia Mother   . Alzheimer's disease Mother   . Colon cancer Neg Hx   . Colon polyps Neg Hx     Past medical history, social, surgical and family history all reviewed in electronic medical record.  No pertanent information unless stated regarding to the chief complaint.   Review of Systems: No headache, visual changes, nausea, vomiting, diarrhea, constipation, dizziness, abdominal pain, skin rash, fevers, chills, night sweats, weight loss, swollen lymph nodes, body aches, joint swelling, muscle aches, chest pain, shortness of breath, mood changes.    Objective  Blood pressure 138/72, pulse 78, height 5\' 7"  (1.702 m), weight 293 lb (132.9 kg).  General: No apparent distress alert and oriented x3 mood and affect normal, dressed appropriately. Left sided facial droop  HEENT: Pupils equal, extraocular movements intact mild lazy eye on right  Respiratory: Patient's speak in full sentences and does not appear short of breath  Cardiovascular: No lower extremity edema, non tender,  no erythema  Skin: Warm dry intact with no signs of infection or rash on extremities or on axial skeleton.  Abdomen: Soft nontender  Neuro: Cranial nerves II through XII are intact, neurovascularly intact in all extremities with 2+ DTRs and 2+ pulses.  Lymph: No lymphadenopathy of posterior or anterior cervical chain or axillae bilaterally.  Gait antalgic gait minorly worse.  MSK:  Non tender with full range of motion and good stability and symmetric strength and tone of shoulders, elbows, wrist, hip and ankles bilaterally. Arthritic changes of most joints.  Knee:left  Difficult to assess secondary to patient's body habitus. Large thigh to calf ratio noted.. Continued  discomfort over the medial and patellofemoral joints Instability with valgus force painful patellar compression. Patellar glide with mild itus. Patellar and quadriceps tendons unremarkable. Hamstring and quadriceps strength is normal.  Contralateral knee has degenerative changes but no pain.   After informed written and verbal consent, patient was seated on exam table. Left knee was prepped with alcohol swab and utilizing anterolateral approach, patient's left knee space was injected with 4:1  marcaine 0.5%: Kenalog 40mg /dL. Patient tolerated the procedure well without immediate complications.    Impression and Recommendations:     This case required medical decision making of moderate complexity.      Note: This dictation was prepared with Dragon dictation along with smaller phrase technology. Any transcriptional errors that result from this process are unintentional.

## 2016-08-13 ENCOUNTER — Ambulatory Visit (INDEPENDENT_AMBULATORY_CARE_PROVIDER_SITE_OTHER): Payer: Medicare HMO | Admitting: Family Medicine

## 2016-08-13 ENCOUNTER — Encounter: Payer: Self-pay | Admitting: Family Medicine

## 2016-08-13 DIAGNOSIS — M1712 Unilateral primary osteoarthritis, left knee: Secondary | ICD-10-CM

## 2016-08-13 NOTE — Patient Instructions (Addendum)
God to see you  Tanner [name] is your friend.  Stay active.  We injected the knee again and hope it helps.  They will get the brace for you as well. They will call you  Lets see you again in 2-3 weeks and if not better lets do the monovisc or orthovisc .

## 2016-08-13 NOTE — Assessment & Plan Note (Signed)
Repeat injection today. Tolerated the procedure well. Was having worsening symptoms. Patient will follow-up in 2-3 weeks and at that time he would be a candidate for viscous supplementation again. Patient will be fitted for custom bracing instability as well as the large thigh to calf ratio. Follow-up again in 2-3 weeks.

## 2016-08-19 ENCOUNTER — Telehealth: Payer: Self-pay | Admitting: *Deleted

## 2016-08-19 DIAGNOSIS — A0472 Enterocolitis due to Clostridium difficile, not specified as recurrent: Secondary | ICD-10-CM

## 2016-08-19 NOTE — Telephone Encounter (Signed)
Per Dr Hilarie Fredrickson, he has accepted this patient into his practice. He would like patient to be scheduled for next available office visit and to have a C Diff PCR. C Diff ordered today and office visit scheduled for Tuesday 10/06/16 @ 11:00 am.

## 2016-08-25 ENCOUNTER — Other Ambulatory Visit: Payer: Medicare HMO

## 2016-08-25 DIAGNOSIS — A0472 Enterocolitis due to Clostridium difficile, not specified as recurrent: Secondary | ICD-10-CM

## 2016-08-26 ENCOUNTER — Other Ambulatory Visit: Payer: Self-pay

## 2016-08-26 DIAGNOSIS — A498 Other bacterial infections of unspecified site: Secondary | ICD-10-CM

## 2016-08-26 LAB — CLOSTRIDIUM DIFFICILE BY PCR: CDIFFPCR: DETECTED — AB

## 2016-08-26 MED ORDER — VANCOMYCIN 50 MG/ML ORAL SOLUTION: ORAL | 0 refills | Status: DC

## 2016-08-31 ENCOUNTER — Ambulatory Visit (INDEPENDENT_AMBULATORY_CARE_PROVIDER_SITE_OTHER): Payer: Medicare HMO | Admitting: Family Medicine

## 2016-08-31 ENCOUNTER — Ambulatory Visit: Payer: Medicare HMO | Admitting: Internal Medicine

## 2016-08-31 ENCOUNTER — Encounter: Payer: Self-pay | Admitting: Family Medicine

## 2016-08-31 DIAGNOSIS — M1712 Unilateral primary osteoarthritis, left knee: Secondary | ICD-10-CM

## 2016-08-31 NOTE — Patient Instructions (Signed)
Good to see you  Tanner [name] is your friend.  We will see you the next 3 weeks.  Should do well!

## 2016-08-31 NOTE — Assessment & Plan Note (Signed)
Failed all conservative therapy. Started on viscous supplementation. Patient will continue conservative therapy. Being fitted with custom brace. Will come back and see me again in 1 week for second in a series of 4 injections.

## 2016-08-31 NOTE — Progress Notes (Signed)
[name] [place],  [address] Phone: [phone] Subjective:    I'm seeing this patient by the request  of:  Tanner Koch, MD   CC: Left knee pain Follow-up  [id number]  Tanner [name] is a 71 y.o. male coming in with complaint of left knee pain. Patient has a past medical history significant for left leg DVT.Severe arthritic changes of the knee previously. Was given an injection. Was doing somewhat better. Starting have pain again and continued instability. Being fitted for a custom brace today. Patient states that the swelling is improved but still has some discomfort.    Past Medical History:  Diagnosis Date  . Acute kidney failure ([place])   . Arthritis   . Back pain   . BPH (benign prostatic hypertrophy)   . Clostridium difficile infection   . Clotting disorder ([place])   . Depression   . DVT (deep venous thrombosis) ([place])   . Hypertension   . Low back pain potentially associated with spinal stenosis   . Neuromuscular disorder ([place])   . Neuropathy of lower extremity    bilateral  . OSA (obstructive sleep apnea)   . PE (pulmonary embolism)   . Ventral hernia    Past Surgical History:  Procedure Laterality Date  . EYE SURGERY    . HERNIA REPAIR     Social History   Social History  . Marital status: Married    Spouse name: Tanner [name]  . Number of children: 2  . Years of education: Tanner [name]   Occupational History  . Retired    Social History Main Topics  . Smoking status: Former Smoker    Packs/day: 2.00    Years: 33.00    Types: Cigarettes    Start date: 08/30/1968    Quit date: 07/02/2001  . Smokeless tobacco: Never Used     Comment: quit 12 years ago  . Alcohol use Yes     Comment: occasional  . Drug use: No  . Sexual activity: Not Currently   Other Topics Concern  . None   Social History Narrative   HSG   [employer]-2 years   Married '66    2 sons '68, '72; 3 grandchildren   Sales-petroleum equipment  sales    Allergies  Allergen Reactions  . Codeine Sulfate Itching and Nausea Only   Family History  Problem Relation Age of Onset  . Diabetes Mother   . Pneumonia Mother   . Alzheimer's disease Mother   . Colon cancer Neg Hx   . Colon polyps Neg Hx     Past medical history, social, surgical and family history all reviewed in electronic medical record.  No pertanent information unless stated regarding to the chief complaint.   Review of Systems: No headache, visual changes, nausea, vomiting, diarrhea, constipation, dizziness, abdominal pain, skin rash, fevers, chills, night sweats, weight loss, swollen lymph nodes, chest pain, shortness of breath, mood changes.  Positive body aches, joint swelling, muscle aches  Objective  Blood pressure 118/68, pulse 76, height 5\' 7"  (1.702 m), weight 293 lb (132.9 kg).  Systems examined below as of 08/31/16 General: NAD A&O x3 mood, affect normal  HEENT: Pupils equal, extraocular movements intact no nystagmus Respiratory: not short of breath at rest or with speaking Cardiovascular: No lower extremity edema, non tender Skin: Warm dry intact with no signs of infection or rash on extremities or on axial skeleton. Abdomen: Soft nontender, no masses Neuro: Cranial nerves  intact, neurovascularly intact in all extremities with 2+ DTRs and 2+ pulses. Lymph: No lymphadenopathy appreciated today  Gait antalgic gait  MSK:  Non tender with full range of motion and good stability and symmetric strength and tone of shoulders, elbows, wrist, hip and ankles bilaterally. Arthritic changes of most joints.  Knee: Left valgus deformity noted. Large thigh to calf ratio.  Tender to palpation over medial and PF joint line.  ROM full in flexion and extension and lower leg rotation. instability with valgus force.  painful patellar compression. Patellar glide with moderate crepitus. Patellar and quadriceps tendons unremarkable. Hamstring and quadriceps strength is  normal. Contralateral knee shows arthritic changes but no tenderness  Left knee was prepped with alcohol swab and utilizing anterolateral approach, patient's left knee space was injected with15 mg/2.5 mL of Orthovisc(sodium hyaluronate) in a prefilled syringe was injected easily into the knee through a 22-gauge needle..Patient tolerated the procedure well without immediate complications..    Impression and Recommendations:     This case required medical decision making of moderate complexity.      Note: This dictation was prepared with Dragon dictation along with smaller phrase technology. Any transcriptional errors that result from this process are unintentional.

## 2016-09-02 ENCOUNTER — Ambulatory Visit: Payer: Medicare HMO | Admitting: Family Medicine

## 2016-09-07 ENCOUNTER — Encounter: Payer: Self-pay | Admitting: Family Medicine

## 2016-09-07 ENCOUNTER — Ambulatory Visit (INDEPENDENT_AMBULATORY_CARE_PROVIDER_SITE_OTHER): Payer: Medicare HMO | Admitting: Family Medicine

## 2016-09-07 DIAGNOSIS — M1712 Unilateral primary osteoarthritis, left knee: Secondary | ICD-10-CM | POA: Diagnosis not present

## 2016-09-07 NOTE — Progress Notes (Signed)
[name] [address] [place], [place] [address] Phone: [phone] Subjective:    I'm seeing this patient by the request  of:  Hoyt Koch, MD   CC: Left knee pain Follow-up  [id number]  Tanner [name] is a 71 y.o. male coming in with complaint of left knee pain. Patient was seen previously and started on viscous supplementation after failing all conservative therapy. Patient is here for second in a series of 4 injections. Has noticed some mild improvement at this time. No worsening symptoms.    Past Medical History:  Diagnosis Date  . Acute kidney failure ([place])   . Arthritis   . Back pain   . BPH (benign prostatic hypertrophy)   . Clostridium difficile infection   . Clotting disorder ([place])   . Depression   . DVT (deep venous thrombosis) ([place])   . Hypertension   . Low back pain potentially associated with spinal stenosis   . Neuromuscular disorder ([place])   . Neuropathy of lower extremity    bilateral  . OSA (obstructive sleep apnea)   . PE (pulmonary embolism)   . Ventral hernia    Past Surgical History:  Procedure Laterality Date  . EYE SURGERY    . HERNIA REPAIR     Social History   Social History  . Marital status: Married    Spouse name: N/A  . Number of children: 2  . Years of education: N/A   Occupational History  . Retired    Social History Main Topics  . Smoking status: Former Smoker    Packs/day: 2.00    Years: 33.00    Types: Cigarettes    Start date: 08/30/1968    Quit date: 07/02/2001  . Smokeless tobacco: Never Used     Comment: quit 12 years ago  . Alcohol use Yes     Comment: occasional  . Drug use: No  . Sexual activity: Not Currently   Other Topics Concern  . None   Social History Narrative   HSG   [employer]-2 years   Married '66    2 sons '68, '72; 3 grandchildren   Sales-petroleum equipment sales    Allergies  Allergen Reactions  . Codeine Sulfate Itching and Nausea Only   Family  History  Problem Relation Age of Onset  . Diabetes Mother   . Pneumonia Mother   . Alzheimer's disease Mother   . Colon cancer Neg Hx   . Colon polyps Neg Hx     Past medical history, social, surgical and family history all reviewed in electronic medical record.  No pertanent information unless stated regarding to the chief complaint.   Review of Systems: Positive body aches, joint swelling, muscle achesStill noted. Patient denies any fevers chills, any abnormal weight loss, any abdominal pain, headaches or visual changes.  Objective  Blood pressure 124/64, pulse 78, height 5\' 7"  (1.702 m), weight 291 lb (132 kg).  Systems examined below as of 09/07/16 General: NAD A&O x3 mood, affect normal Morbidly obese HEENT: Pupils equal, extraocular movements intact no nystagmus Respiratory: not short of breath at rest or with speaking Cardiovascular: No lower extremity edema, non tender Skin: Warm dry intact with no signs of infection or rash on extremities or on axial skeleton. Abdomen: Soft nontender, no masses Neuro: Cranial nerves  intact, neurovascularly intact in all extremities with 2+ DTRs and 2+ pulses. Lymph: No lymphadenopathy appreciated today  Gait normal with good balance and coordination.  MSK:  Non tender with full range of motion and good stability and symmetric strength and tone of shoulders, elbows, wrist,  knee hips and ankles bilaterally.  Significant arthritic changes of multiple joints  Knee: Left valgus deformity noted. Large thigh to calf ratio.  Tender to palpation over medial and PF joint line.  ROM full in flexion and extension and lower leg rotation. instability with valgus force.  painful patellar compression. Patellar glide with moderate crepitus. Patellar and quadriceps tendons unremarkable. Hamstring and quadriceps strength is normal. Contralateral knee shows arthritic changes with instability but no pain  After informed written and verbal consent, patient  was seated on exam table. Left knee was prepped with alcohol swab and utilizing anterolateral approach, patient's left knee space was injected with15 mg/2.5 mL of Orthovisc(sodium hyaluronate) in a prefilled syringe was injected easily into the knee through a 22-gauge needle..Patient tolerated the procedure well without immediate complications.    Impression and Recommendations:     This case required medical decision making of moderate complexity.      Note: This dictation was prepared with Dragon dictation along with smaller phrase technology. Any transcriptional errors that result from this process are unintentional.

## 2016-09-07 NOTE — Patient Instructions (Signed)
Good see you  Start the effexor and half the wellbutrin if you can  B12 1044mcg daily  B6 200mg  daily  See you next week

## 2016-09-07 NOTE — Assessment & Plan Note (Signed)
Patient is doing very well with the viscous supplementation. We discussed icing regimen. Follow-up in one week for third in a series of 4 injections.

## 2016-09-08 ENCOUNTER — Other Ambulatory Visit: Payer: Self-pay | Admitting: Family Medicine

## 2016-09-08 ENCOUNTER — Other Ambulatory Visit: Payer: Self-pay

## 2016-09-08 MED ORDER — VENLAFAXINE HCL ER 37.5 MG PO CP24: 37.5000 mg | ORAL_CAPSULE | Freq: Every day | ORAL | 1 refills | Status: DC

## 2016-09-14 ENCOUNTER — Ambulatory Visit (INDEPENDENT_AMBULATORY_CARE_PROVIDER_SITE_OTHER): Payer: Medicare HMO | Admitting: Family Medicine

## 2016-09-14 ENCOUNTER — Encounter: Payer: Self-pay | Admitting: Family Medicine

## 2016-09-14 DIAGNOSIS — M1712 Unilateral primary osteoarthritis, left knee: Secondary | ICD-10-CM | POA: Diagnosis not present

## 2016-09-14 MED ORDER — VENLAFAXINE HCL ER 37.5 MG PO CP24: 37.5000 mg | ORAL_CAPSULE | Freq: Every day | ORAL | 0 refills | Status: DC

## 2016-09-14 NOTE — Progress Notes (Signed)
[name] [place], [place] [address] Phone: [phone] Subjective:    I'm seeing this patient by the request  of:  Hoyt Koch, MD   CC: Left knee pain Follow-up  ERX:[id number]  Tanner [name] is a 71 y.o. male coming in with complaint of left knee pain. Patient was seen previously and started on viscous supplementation after failing all conservative therapy. Patient is here for third and fourth injection in viscous supplementation. Continues to improve.    Past Medical History:  Diagnosis Date  . Acute kidney failure ([place])   . Arthritis   . Back pain   . BPH (benign prostatic hypertrophy)   . Clostridium difficile infection   . Clotting disorder ([place])   . Depression   . DVT (deep venous thrombosis) ([place])   . Hypertension   . Low back pain potentially associated with spinal stenosis   . Neuromuscular disorder ([facility])   . Neuropathy of lower extremity    bilateral  . OSA (obstructive sleep apnea)   . PE (pulmonary embolism)   . Ventral hernia    Past Surgical History:  Procedure Laterality Date  . EYE SURGERY    . HERNIA REPAIR     Social History   Social History  . Marital status: Married    Spouse name: N/A  . Number of children: 2  . Years of education: N/A   Occupational History  . Retired    Social History Main Topics  . Smoking status: Former Smoker    Packs/day: 2.00    Years: 33.00    Types: Cigarettes    Start date: 08/30/1968    Quit date: 07/02/2001  . Smokeless tobacco: Never Used     Comment: quit 12 years ago  . Alcohol use Yes     Comment: occasional  . Drug use: No  . Sexual activity: Not Currently   Other Topics Concern  . None   Social History Narrative   HSG   [employer]-2 years   Married '66    2 sons '68, '72; 3 grandchildren   Sales-petroleum equipment sales    Allergies  Allergen Reactions  . Codeine Sulfate Itching and Nausea Only   Family History  Problem Relation Age  of Onset  . Diabetes Mother   . Pneumonia Mother   . Alzheimer's disease Mother   . Colon cancer Neg Hx   . Colon polyps Neg Hx     Past medical history, social, surgical and family history all reviewed in electronic medical record.  No pertanent information unless stated regarding to the chief complaint.   Review of Systems: Positive body aches, joint swelling, muscle achesStill noted. Patient denies any fevers chills, any abnormal weight loss, any abdominal pain, headaches or visual changes.  Objective  Blood pressure 126/72, pulse 83, weight 293 lb (132.9 kg), SpO2 93 %.  Systems examined below as of 09/14/16 General: NAD A&O x3 mood, affect normal  HEENT: Pupils equal, extraocular movements intact no nystagmus Respiratory: not short of breath at rest or with speaking Cardiovascular: No lower extremity edema, non tender Skin: Warm dry intact with no signs of infection or rash on extremities or on axial skeleton. Abdomen: Soft nontender, no masses Neuro: Cranial nerves  intact, neurovascularly intact in all extremities with 2+ DTRs and 2+ pulses. Lymph: No lymphadenopathy appreciated today  Gait antalgic gait MSK: Non tender with full range of motion and good stability and symmetric strength and tone of  shoulders, elbows, wrist,  knee hips and ankles bilaterally.  Significant arthritic changes of multiple joints  Knee: Bilateral valgus deformity noted. Large thigh to calf ratio.  Tender to palpation over medial and PF joint line.  ROM full in flexion and extension and lower leg rotation. instability with valgus force.  painful patellar compression. Patellar glide with moderate crepitus. Patellar and quadriceps tendons unremarkable. Hamstring and quadriceps strength is normal.   After informed written and verbal consent, patient was seated on exam table. Left knee was prepped with alcohol swab and utilizing anterolateral approach, patient's left knee space was injected with15  mg/2.5 mL of Orthovisc(sodium hyaluronate) in a prefilled syringe was injected easily into the knee through a 22-gauge needle..Patient tolerated the procedure well without immediate complications.    Impression and Recommendations:     This case required medical decision making of moderate complexity.      Note: This dictation was prepared with Dragon dictation along with smaller phrase technology. Any transcriptional errors that result from this process are unintentional.

## 2016-09-14 NOTE — Patient Instructions (Signed)
3 down and one to go! You know the drill  One more to go.

## 2016-09-14 NOTE — Progress Notes (Signed)
Pre-visit discussion using our clinic review tool. No additional management support is needed unless otherwise documented below in the visit note.  

## 2016-09-14 NOTE — Assessment & Plan Note (Signed)
Patient has done well with the viscous supplementation. We discussed icing regimen and home exercise. Encourage patient to increase activity as tolerated. Follow-up in one week for fourth and final injection.

## 2016-09-20 NOTE — Progress Notes (Signed)
[name] [address] [place], [place] [address] Phone: [phone] Subjective:    I'm seeing this patient by the request  of:  Hoyt Koch, MD   CC: Left knee pain Follow-up  Tanner [name]  Tanner [name] is a 71 y.o. male coming in with complaint of left knee pain. Patient was seen previously and started on viscous supplementation after failing all conservative therapy. Here for final injection.    Past Medical History:  Diagnosis Date  . Acute kidney failure ([place])   . Arthritis   . Back pain   . BPH (benign prostatic hypertrophy)   . Clostridium difficile infection   . Clotting disorder ([place])   . Depression   . DVT (deep venous thrombosis) ([place])   . Hypertension   . Low back pain potentially associated with spinal stenosis   . Neuromuscular disorder ([place])   . Neuropathy of lower extremity    bilateral  . OSA (obstructive sleep apnea)   . PE (pulmonary embolism)   . Ventral hernia    Past Surgical History:  Procedure Laterality Date  . EYE SURGERY    . HERNIA REPAIR     Social History   Social History  . Marital status: Married    Spouse name: N/A  . Number of children: 2  . Years of education: N/A   Occupational History  . Retired    Social History Main Topics  . Smoking status: Former Smoker    Packs/day: 2.00    Years: 33.00    Types: Cigarettes    Start date: 08/30/1968    Quit date: 07/02/2001  . Smokeless tobacco: Never Used     Comment: quit 12 years ago  . Alcohol use Yes     Comment: occasional  . Drug use: No  . Sexual activity: Not Currently   Other Topics Concern  . None   Social History Narrative   HSG   [employer]-2 years   Married '66    2 sons '68, '72; 3 grandchildren   Sales-petroleum equipment sales    Allergies  Allergen Reactions  . Codeine Sulfate Itching and Nausea Only   Family History  Problem Relation Age of Onset  . Diabetes Mother   . Pneumonia Mother   . Alzheimer's  disease Mother   . Colon cancer Neg Hx   . Colon polyps Neg Hx     Past medical history, social, surgical and family history all reviewed in electronic medical record.  No pertanent information unless stated regarding to the chief complaint.   Positive for some mild joint swelling  Objective  Blood pressure 134/72, pulse 72, resp. rate 16, weight 293 lb 4 oz (133 kg), SpO2 90 %.  Systems examined below as of 09/21/16 General: NAD A&O x3 mood, affect normal  HEENT: Pupils equal, extraocular movements intact no nystagmus Respiratory: not short of breath at rest or with speaking Cardiovascular: No lower extremity edema, non tender Skin: Warm dry intact with no signs of infection or rash on extremities or on axial skeleton. Abdomen: Soft nontender, no masses Neuro: Cranial nerves  intact, neurovascularly intact in all extremities with 2+ DTRs and 2+ pulses. Lymph: No lymphadenopathy appreciated today  Gait Antalgic gait MSK: Non tender with full range of motion and good stability and symmetric strength and tone of shoulders, elbows, wrist,  hips and ankles bilaterally.   Arthritic changes of multiple joints Knee: Bilateral  valgus deformity noted. Large thigh to calf ratio.  Tender to palpation over medial and PF joint line.  ROM full in flexion and extension and lower leg rotation. instability with valgus force.  painful patellar compression. Patellar glide with moderate crepitus. Patellar and quadriceps tendons unremarkable. Hamstring and quadriceps strength is normal.  After informed written and verbal consent, patient was seated on exam table. Left knee was prepped with alcohol swab and utilizing anterolateral approach, patient's left knee space was injected with15 mg/2.5 mL of Orthovisc(sodium hyaluronate) in a prefilled syringe was injected easily into the knee through a 22-gauge needle..Patient tolerated the procedure well without immediate complications.    Impression and  Recommendations:     This case required medical decision making of moderate complexity.      Note: This dictation was prepared with Dragon dictation along with smaller phrase technology. Any transcriptional errors that result from this process are unintentional.

## 2016-09-21 ENCOUNTER — Ambulatory Visit (INDEPENDENT_AMBULATORY_CARE_PROVIDER_SITE_OTHER): Payer: Medicare HMO | Admitting: Family Medicine

## 2016-09-21 ENCOUNTER — Encounter: Payer: Self-pay | Admitting: Family Medicine

## 2016-09-21 DIAGNOSIS — M1712 Unilateral primary osteoarthritis, left knee: Secondary | ICD-10-CM

## 2016-09-21 NOTE — Progress Notes (Signed)
Pre-visit discussion using our clinic review tool. No additional management support is needed unless otherwise documented below in the visit note.  

## 2016-09-21 NOTE — Assessment & Plan Note (Signed)
Orthovisc injection given. Fourth injection given. Hopefully patient will do well with conservative therapy. Follow-up in 4 weeks.

## 2016-09-21 NOTE — Patient Instructions (Signed)
Saunders Glance are done! See me again in 4 weeks so I know you are doing well!

## 2016-09-24 ENCOUNTER — Encounter: Payer: Self-pay | Admitting: Internal Medicine

## 2016-09-24 ENCOUNTER — Ambulatory Visit: Payer: Medicare HMO | Admitting: Internal Medicine

## 2016-09-24 ENCOUNTER — Ambulatory Visit (INDEPENDENT_AMBULATORY_CARE_PROVIDER_SITE_OTHER): Payer: Medicare HMO | Admitting: Internal Medicine

## 2016-09-24 VITALS — BP 126/77 | HR 72 | Temp 98.3°F | Ht 67.0 in | Wt 293.0 lb

## 2016-09-24 DIAGNOSIS — A0471 Enterocolitis due to Clostridium difficile, recurrent: Secondary | ICD-10-CM

## 2016-09-24 NOTE — Progress Notes (Signed)
RFV: recurrent c.difficile pcp dr Sharlet Salina  Patient ID: Tanner [name], male   DOB: 08/29/1945, 71 y.o.   MRN: [id number]  HPI Mr Berg is a 71 yo M with recurrent c.difficile. His  First episode in July 2015 -hospitalized during first episode, vanco 125mg  QID x 14 days but did well for over 2 yrs.   In 2017 also had episode, after receiving treatment for urinary tract infection. He started to have watery diarrhea and dx in January 2018 - + test. Again he felt his symptoms improved but then recurred in March 2018 - + test. He is followed by dr pyrtle who placed him on his current vanco taper. 125mg  QId x 14 d, then BID x 7, then 1 daily x 7 d then once every 3 days x 4 wk. He is referred to ID clinic for eval for FMT.  In the last 3-4 months, intentional weight loss in addn to being sick. He has lost 11 lb from July -jan.   HIs current BM are mostly 1 soft BM , extruded, not formed stool. Not back to his baseline  When he has symptoms he gets bloated in addn to water diarrhea.   I have reviewed his records in [facility] link  Outpatient Encounter Prescriptions as of 09/24/2016  Medication Sig  . acetaminophen (TYLENOL) 500 MG tablet Take 1,000 mg by mouth as needed.  Marland Kitchen aspirin 81 MG tablet Take 162 mg by mouth daily.  Marland Kitchen atenolol (TENORMIN) 25 MG tablet Take 12.5 mg by mouth daily.  Marland Kitchen buPROPion (WELLBUTRIN SR) 150 MG 12 hr tablet Take 150 mg by mouth 2 (two) times daily.  . finasteride (PROSCAR) 5 MG tablet Take 5 mg by mouth at bedtime.  . furosemide (LASIX) 20 MG tablet Take 20 mg by mouth every other day.  . gabapentin (NEURONTIN) 300 MG capsule 600 mg. Take 3 tablets twice daily  . hydrochlorothiazide (HYDRODIURIL) 25 MG tablet Take 25 mg by mouth daily.  Marland Kitchen ketoconazole (NIZORAL) 2 % shampoo Apply topically daily.  . Menthol-Methyl Salicylate (THERA-GESIC PLUS) CREA Apply 1 application topically at bedtime. Apply to lower back  . Multiple Vitamins-Minerals (MULTIVITAMINS THER.  W/MINERALS) TABS Take 1 tablet by mouth daily.  . potassium chloride SA (K-DUR,KLOR-CON) 20 MEQ tablet Take 20 mEq by mouth as needed.  . traMADol (ULTRAM) 50 MG tablet Take 100 mg by mouth 3 (three) times daily. Takes 2 tabs  . vancomycin (VANCOCIN) 50 mg/mL oral solution Take 125mg  QID for 14 days Take 125mg  BID for 7 days Take 125mg  QD for 7 days Take 125mg  every 3 days for 4 weeks  . venlafaxine XR (EFFEXOR-XR) 37.5 MG 24 hr capsule Take 1 capsule (37.5 mg total) by mouth daily with breakfast.  . zolpidem (AMBIEN) 10 MG tablet Take 1 tablet (10 mg total) by mouth at bedtime. For sleep   No facility-administered encounter medications on file as of 09/24/2016.      Patient Active Problem List   Diagnosis Date Noted  . Degenerative arthritis of left knee 01/30/2016  . Morbid obesity ([place]) 01/30/2016  . Lip abscess 03/15/2015  . Routine general medical examination at a health care facility 04/20/2014  . C. difficile colitis 12/19/2013  . History of pulmonary embolus (PE) 12/19/2013  . Subacromial bursitis 05/10/2013  . INSOMNIA UNSPECIFIED 09/16/2007  . ANXIETY 03/29/2007  . DEPRESSION 03/29/2007  . OBSTRUCTIVE SLEEP APNEA 03/29/2007  . GLAUCOMA NOS 03/29/2007  . BPH (benign prostatic hyperplasia) 03/29/2007  . OSTEOARTHRITIS, KNEE  03/29/2007  . ELEVATED BP W/O HYPERTENSION 03/29/2007  . BELL'S PALSY, HX OF 03/29/2007     Health Maintenance Due  Topic Date Due  . Hepatitis C Screening  30-Mar-1946    [employer] - [place] [employer]. 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  Retired - worked Landscape architect Social History  Substance Use Topics  . Smoking status: Former Smoker    Packs/day: 2.00    Years: 33.00    Types: Cigarettes    Start date: 08/30/1968    Quit date: 07/02/2001  . Smokeless tobacco: Never Used     Comment: quit 12 years ago  . Alcohol use Yes     Comment: occasional   family history includes Alzheimer's disease in his mother; Diabetes in his mother; Pneumonia in  his mother.  Review of Systems  Constitutional: Negative for fever, chills, diaphoresis, activity change, appetite change, fatigue and unexpected weight change.  HENT: Negative for congestion, sore throat, rhinorrhea, sneezing, trouble swallowing and sinus pressure.  Eyes: Negative for photophobia and visual disturbance.  Respiratory: Negative for cough, chest tightness, shortness of breath, wheezing and stridor.  Cardiovascular: Negative for chest pain, palpitations and leg swelling.  Gastrointestinal: Negative for nausea, vomiting, abdominal pain, diarrhea, constipation, blood in stool, abdominal distention and anal bleeding.  Genitourinary: Negative for dysuria, hematuria, flank pain and difficulty urinating.  Musculoskeletal: Negative for myalgias, back pain, joint swelling, arthralgias and gait problem.  Skin: Negative for color change, pallor, rash and wound.  Neurological: Negative for dizziness, tremors, weakness and light-headedness.  Hematological: Negative for adenopathy. Does not bruise/bleed easily.  Psychiatric/Behavioral: Negative for behavioral problems, confusion, sleep disturbance, dysphoric mood, decreased concentration and agitation.    Physical Exam   Ht 5\' 7"  (1.702 m)   Wt 293 lb (132.9 kg)   BMI 45.89 kg/m   Physical Exam  Constitutional: He is oriented to person, place, and time. He appears well-developed and well-nourished. No distress.  HENT:  Mouth/Throat: Oropharynx is clear and moist. No oropharyngeal exudate.  Abdominal: Soft. Bowel sounds are normal. He exhibits no distension. There is no tenderness.  Neurological: He is alert and oriented to person, place, and time.  Skin: Skin is warm and dry. No rash noted. No erythema.  Psychiatric: He has a normal mood and affect. His behavior is normal.     CBC Lab Results  Component Value Date   WBC 10.4 06/23/2016   RBC 4.96 06/23/2016   HGB 14.6 06/23/2016   HCT 43.5 06/23/2016   PLT 233.0 06/23/2016     MCV 87.7 06/23/2016   MCH 30.3 03/30/2014   MCHC 33.6 06/23/2016   RDW 15.7 (H) 06/23/2016   LYMPHSABS 2.3 06/23/2016   MONOABS 0.6 06/23/2016   EOSABS 0.2 06/23/2016    BMET Lab Results  Component Value Date   NA 140 11/07/2014   K 3.8 11/07/2014   CL 104 01/23/2014   CO2 24 01/23/2014   GLUCOSE 90 01/23/2014   BUN 18 11/07/2014   CREATININE 1.2 11/07/2014   CALCIUM 8.6 01/23/2014   GFRNONAA 67 (L) 12/21/2013   GFRAA 77 (L) 12/21/2013      Assessment and Plan  - recurrent cdiff = recommend to finish his current taper.  Will move forward with FMT if he has another relapse/episode. I have given him an outline of what the process is like.  - patient assistance with [facility]  I have spent 35 min with patient discussing FMT tx for recurrent cdifficile infection.

## 2016-09-29 DIAGNOSIS — M1712 Unilateral primary osteoarthritis, left knee: Secondary | ICD-10-CM | POA: Diagnosis not present

## 2016-10-06 ENCOUNTER — Ambulatory Visit (INDEPENDENT_AMBULATORY_CARE_PROVIDER_SITE_OTHER): Payer: Medicare HMO | Admitting: Internal Medicine

## 2016-10-06 ENCOUNTER — Encounter: Payer: Self-pay | Admitting: Internal Medicine

## 2016-10-06 VITALS — BP 130/60 | HR 76 | Ht 65.75 in | Wt 291.5 lb

## 2016-10-06 DIAGNOSIS — Z86010 Personal history of colonic polyps: Secondary | ICD-10-CM | POA: Diagnosis not present

## 2016-10-06 DIAGNOSIS — A0472 Enterocolitis due to Clostridium difficile, not specified as recurrent: Secondary | ICD-10-CM | POA: Diagnosis not present

## 2016-10-06 NOTE — Patient Instructions (Signed)
Please complete your vancomycin taper.  Continue your florastor twice daily x at least 1 month after you have finished your vancomycin taper. You should also remember to take florastor as soon as you start any antibiotics in the future and for at least 1 month thereafter.  As per Dr Storm Frisk recommendation, if you have 5 bowel movements within a 1 day period soon after you have completed the vancomycin taper, please complete the stool studies that Dr Baxter Flattery has given you.  You will be due for a recall colonoscopy in 06/2017. We will send you a reminder in the mail when it gets closer to that time.  If you are age 86 or older, your body mass index should be between 23-30. Your Body mass index is 47.41 kg/m. If this is out of the aforementioned range listed, please consider follow up with your Primary Care Provider.  If you are age 64 or younger, your body mass index should be between 19-25. Your Body mass index is 47.41 kg/m. If this is out of the aformentioned range listed, please consider follow up with your Primary Care Provider.

## 2016-10-06 NOTE — Progress Notes (Signed)
Subjective:    Patient ID: Tanner [name], male    DOB: Dec 23, 1945, 71 y.o.   MRN: [id number]  HPI Tanner [name] is a 71 -year-old male with a history of recurrent C. difficile colitis, history of adenomatous colon polyps who is here for follow-up. He is here alone today. He also has a history of remote DVT, low back pain, hypertension, sleep apnea.  He has seen Dr. Baxter Flattery with infectious disease to discuss possible fecal microbiota transplantation given his recurrent C. difficile.  His first C. difficile episode was in July 2015 for which he was hospitalized. He was treated with vancomycin and did well for over 2 years. Last year after receiving antibiotics for UTI he had watery diarrhea in January 2018. He tested positive for C. difficile which was felt to have resolved and then recurred in March 2018. He is currently on a prolonged vancomycin taper. He is currently taking vancomycin 125 mg every third day over 4 weeks. He has 2 weeks left.  His diarrhea has resolved. He's having mostly one formed stool daily. No blood in his stool or melena. No abdominal pain. Appetite is been good.  He states his only complaint is that he is often sleepy in the morning despite sleeping well at night. He is using 5 mg of Ambien at night which she finds very helpful. He denies fever, chills, chest pain and dyspnea today.  He has been using Florastor 250 mg twice a day  Past Medical History:  Diagnosis Date  . Acute kidney failure ([place])   . Arthritis   . Back pain   . BPH (benign prostatic hypertrophy)   . Clostridium difficile infection   . Clotting disorder ([place])   . Depression   . DVT (deep venous thrombosis) ([place])   . Hypertension   . Low back pain potentially associated with spinal stenosis   . Neuromuscular disorder ([place])   . Neuropathy of lower extremity    bilateral  . OSA (obstructive sleep apnea)   . PE (pulmonary embolism)   . Ventral hernia       Review of Systems As per HPI,  otherwise negative  Current Medications, Allergies, Past Medical History, Past Surgical History, Family History and Social History were reviewed in [facility] record.     Objective:   Physical Exam BP 130/60 (BP Location: Left Arm, Patient Position: Sitting, Cuff Size: Large)   Pulse 76   Ht 5' 5.75" (1.67 m) Comment: height measured without shoes  Wt 291 lb 8 oz (132.2 kg)   BMI 47.41 kg/m  Constitutional: Well-developed and well-nourished. No distress. HEENT: Normocephalic and atraumatic.   No scleral icterus. Psychiatric: Normal mood and affect. Behavior is normal.  CBC    Component Value Date/Time   WBC 10.4 06/23/2016 1442   RBC 4.96 06/23/2016 1442   HGB 14.6 06/23/2016 1442   HGB 14.3 03/30/2014 1511   HCT 43.5 06/23/2016 1442   HCT 42.0 03/30/2014 1511   PLT 233.0 06/23/2016 1442   PLT 176 03/30/2014 1511   MCV 87.7 06/23/2016 1442   MCV 89 03/30/2014 1511   MCH 30.3 03/30/2014 1511   MCH 28.4 12/21/2013 0020   MCHC 33.6 06/23/2016 1442   RDW 15.7 (H) 06/23/2016 1442   RDW 14.9 03/30/2014 1511   LYMPHSABS 2.3 06/23/2016 1442   LYMPHSABS 2.0 03/30/2014 1511   MONOABS 0.6 06/23/2016 1442   EOSABS 0.2 06/23/2016 1442   EOSABS 0.2 03/30/2014 1511   BASOSABS 0.1  06/23/2016 1442   BASOSABS 0.0 03/30/2014 1511   [facility] colonoscopy Jan 2016 -- reviewed including with the patient    Assessment & Plan:  71 -year-old male with a history of recurrent C. difficile colitis, history of adenomatous colon polyps who is here for follow-up.  1. Recurrent C. difficile colitis - completing vancomycin taper now. Agree with Dr. Baxter Flattery that if he recurs again we should proceed to fecal transplantation. We spent time today discussing this specifically the procedure and likely stool donation from the [facility]. He already has a C. difficile test ordered through Dr. Storm [name] office and he knows that he should submit stool for testing if he has 5 days of loose  stools following vancomycin therapy. Will continue Florastor 250 mg twice daily for at least 1 month after vancomycin therapy. I recommended he resume this if and when he is prescribed antibiotics in the future.  2. History of adenomatous colon polyps -- surveillance colonoscopy due January 2019  25 minutes spent with the patient today. Greater than 50% was spent in counseling and coordination of care with the patient

## 2016-11-10 ENCOUNTER — Telehealth: Payer: Self-pay | Admitting: Internal Medicine

## 2016-11-10 ENCOUNTER — Telehealth: Payer: Self-pay

## 2016-11-10 NOTE — Telephone Encounter (Signed)
Patient is on the list for [facility] 2018 and may be a good candidate for an AWV. Please let me know if/when appt is scheduled.   

## 2016-11-10 NOTE — Telephone Encounter (Signed)
Called patient to schedule awv. Lvm for patient to call office to schedule appt.  °

## 2016-12-11 ENCOUNTER — Ambulatory Visit: Payer: Medicare HMO | Admitting: Nurse Practitioner

## 2016-12-11 ENCOUNTER — Ambulatory Visit (INDEPENDENT_AMBULATORY_CARE_PROVIDER_SITE_OTHER): Payer: Medicare HMO | Admitting: Internal Medicine

## 2016-12-11 ENCOUNTER — Encounter: Payer: Self-pay | Admitting: Internal Medicine

## 2016-12-11 VITALS — BP 144/84 | HR 75 | Ht 65.7 in | Wt 295.0 lb

## 2016-12-11 DIAGNOSIS — J019 Acute sinusitis, unspecified: Secondary | ICD-10-CM | POA: Diagnosis not present

## 2016-12-11 DIAGNOSIS — J309 Allergic rhinitis, unspecified: Secondary | ICD-10-CM | POA: Diagnosis not present

## 2016-12-11 DIAGNOSIS — Z794 Long term (current) use of insulin: Secondary | ICD-10-CM

## 2016-12-11 DIAGNOSIS — E119 Type 2 diabetes mellitus without complications: Secondary | ICD-10-CM | POA: Insufficient documentation

## 2016-12-11 DIAGNOSIS — E1140 Type 2 diabetes mellitus with diabetic neuropathy, unspecified: Secondary | ICD-10-CM | POA: Diagnosis not present

## 2016-12-11 HISTORY — DX: Type 2 diabetes mellitus without complications: E11.9

## 2016-12-11 MED ORDER — METFORMIN HCL ER 500 MG PO TB24: 500.0000 mg | ORAL_TABLET | Freq: Every day | ORAL | 3 refills | Status: DC

## 2016-12-11 MED ORDER — AMOXICILLIN 500 MG PO CAPS: 1000.0000 mg | ORAL_CAPSULE | Freq: Two times a day (BID) | ORAL | 0 refills | Status: DC

## 2016-12-11 MED ORDER — CETIRIZINE HCL 10 MG PO TABS: 10.0000 mg | ORAL_TABLET | Freq: Every day | ORAL | 11 refills | Status: DC

## 2016-12-11 NOTE — Patient Instructions (Addendum)
.  Please take all new medication as prescribed - the antibiotic, as well as zyrtec as needed for allergy symptoms  Please take all new medication as prescribed - the metformin ER 500 mg - 1 per day for new elevated sugar  Please continue all other medications as before, and refills have been done if requested.  Please have the pharmacy call with any other refills you may need.  Please keep your appointments with your specialists as you may have planned  Please return in 3 months, or sooner if needed, with Lab testing done 3-5 days before

## 2016-12-11 NOTE — Progress Notes (Signed)
Subjective:    Patient ID: Tanner [name], male    DOB: 05/30/1946, 71 y.o.   MRN: [id number]  HPI   Here with 2-3 days acute onset fever, facial pain, pressure, headache, general weakness and malaise, and greenish d/c, with mild ST and cough, but pt denies chest pain, wheezing, increased sob or doe, orthopnea, PND, increased LE swelling, palpitations, dizziness or syncope.  Does have several wks ongoing nasal allergy symptoms with clearish congestion, itch and sneezing, without fever, pain, ST, cough, swelling or wheezing.  Also mentions had A1c done twice at [place] earlier this mo, now at 7.1, and 7.7, as when last done here was < 6 in 2016.  For some reason it suggested per pt that he "might have diabetes" and no tx.  He went to an appt and found there he doctor had had his schedule changed, and was not seen Past Medical History:  Diagnosis Date  . Acute kidney failure ([place])   . Arthritis   . Back pain   . BPH (benign prostatic hypertrophy)   . Clostridium difficile infection   . Clotting disorder ([place])   . Depression   . Diabetes ([place]) 12/11/2016  . DVT (deep venous thrombosis) ([place])   . Hypertension   . Low back pain potentially associated with spinal stenosis   . Neuromuscular disorder ([place])   . Neuropathy of lower extremity    bilateral  . OSA (obstructive sleep apnea)   . PE (pulmonary embolism)   . Ventral hernia    Past Surgical History:  Procedure Laterality Date  . EYE SURGERY    . HERNIA REPAIR      reports that he quit smoking about 15 years ago. His smoking use included Cigarettes. He started smoking about 48 years ago. He has a 66.00 pack-year smoking history. He has never used smokeless tobacco. He reports that he drinks alcohol. He reports that he does not use drugs. family history includes Alzheimer's disease in his mother; Diabetes in his mother; Pneumonia in his mother. Allergies  Allergen Reactions  . Codeine Sulfate Itching and Nausea Only   Current Outpatient  Prescriptions on File Prior to Visit  Medication Sig Dispense Refill  . acetaminophen (TYLENOL) 500 MG tablet Take 1,000 mg by mouth as needed.    Marland Kitchen aspirin 81 MG tablet Take 162 mg by mouth daily.    Marland Kitchen atenolol (TENORMIN) 25 MG tablet Take 12.5 mg by mouth daily.    Marland Kitchen buPROPion (WELLBUTRIN SR) 150 MG 12 hr tablet Take 150 mg by mouth 2 (two) times daily.    . finasteride (PROSCAR) 5 MG tablet Take 5 mg by mouth at bedtime.    . furosemide (LASIX) 20 MG tablet Take 20 mg by mouth every other day.    . gabapentin (NEURONTIN) 300 MG capsule 600 mg. Take 3 tablets twice daily    . hydrochlorothiazide (HYDRODIURIL) 25 MG tablet Take 25 mg by mouth daily.    Marland Kitchen ketoconazole (NIZORAL) 2 % shampoo Apply topically daily.    . Menthol-Methyl Salicylate (THERA-GESIC PLUS) CREA Apply 1 application topically at bedtime. Apply to lower back    . Multiple Vitamins-Minerals (MULTIVITAMINS THER. W/MINERALS) TABS Take 1 tablet by mouth daily.    . potassium chloride SA (K-DUR,KLOR-CON) 20 MEQ tablet Take 20 mEq by mouth as needed.    . traMADol (ULTRAM) 50 MG tablet Take 100 mg by mouth 3 (three) times daily. Takes 2 tabs    . zolpidem (AMBIEN) 10 MG tablet  Take 1 tablet (10 mg total) by mouth at bedtime. For sleep 30 tablet 0   No current facility-administered medications on file prior to visit.    Review of Systems  Constitutional: Negative for other unusual diaphoresis or sweats HENT: Negative for ear discharge or swelling Eyes: Negative for other worsening visual disturbances Respiratory: Negative for stridor or other swelling  Gastrointestinal: Negative for worsening distension or other blood Genitourinary: Negative for retention or other urinary change Musculoskeletal: Negative for other MSK pain or swelling Skin: Negative for color change or other new lesions Neurological: Negative for worsening tremors and other numbness  Psychiatric/Behavioral: Negative for worsening agitation or other  fatigue All other system neg per pt    Objective:   Physical Exam BP (!) 144/84   Pulse 75   Ht 5' 5.7" (1.669 m)   Wt 295 lb (133.8 kg)   SpO2 96%   BMI 48.05 kg/m  VS noted, mild ill Constitutional: Pt appears in NAD HENT: Head: NCAT.  Right Ear: External ear normal.  Left Ear: External ear normal.  Bilat tm's with mild erythema.  Max sinus areas mild tender.  Pharynx with mild erythema, no exudate Eyes: . Pupils are equal, round, and reactive to light. Conjunctivae and EOM are normal Nose: without d/c or deformity Neck: Neck supple. Gross normal ROM Cardiovascular: Normal rate and regular rhythm.   Pulmonary/Chest: Effort normal and breath sounds without rales or wheezing.  Abd:  Soft, NT, ND, + BS, no organomegaly Neurological: Pt is alert. At baseline orientation, motor grossly intact Skin: Skin is warm. No rashes, other new lesions, no LE edema Psychiatric: Pt behavior is normal without agitation  No other exam findings  Lab Results  Component Value Date   WBC 10.4 06/23/2016   HGB 14.6 06/23/2016   HCT 43.5 06/23/2016   PLT 233.0 06/23/2016   GLUCOSE 90 01/23/2014   CHOL 91 11/07/2014   TRIG 235 (A) 11/07/2014   HDL 29 (A) 11/07/2014   LDLDIRECT 65.9 09/13/2007   LDLCALC 43 11/07/2014   ALT 29 11/07/2014   AST 17 11/07/2014   NA 140 11/07/2014   K 3.8 11/07/2014   CL 104 01/23/2014   CREATININE 1.2 11/07/2014   BUN 18 11/07/2014   CO2 24 01/23/2014   TSH 2.28 09/13/2007   PSA 1.09 09/13/2007   INR 1.13 12/19/2013   HGBA1C 5.8 11/07/2014       Assessment & Plan:

## 2016-12-11 NOTE — Assessment & Plan Note (Signed)
Mild to mod, for antibx course,  to f/u any worsening symptoms or concerns 

## 2016-12-11 NOTE — Assessment & Plan Note (Signed)
Mild to mod, for zyrtec daily prn,  to f/u any worsening symptoms or concerns

## 2016-12-11 NOTE — Assessment & Plan Note (Signed)
New onset, asympt, for metformiin ER 500 qd, DM diet, f/u at 3 mo with labs,

## 2016-12-23 ENCOUNTER — Encounter: Payer: Self-pay | Admitting: Internal Medicine

## 2016-12-23 ENCOUNTER — Ambulatory Visit (INDEPENDENT_AMBULATORY_CARE_PROVIDER_SITE_OTHER): Payer: Medicare HMO | Admitting: Internal Medicine

## 2016-12-23 VITALS — BP 118/70 | HR 63 | Ht 65.7 in | Wt 294.0 lb

## 2016-12-23 DIAGNOSIS — K1121 Acute sialoadenitis: Secondary | ICD-10-CM | POA: Diagnosis not present

## 2016-12-23 DIAGNOSIS — A0472 Enterocolitis due to Clostridium difficile, not specified as recurrent: Secondary | ICD-10-CM | POA: Diagnosis not present

## 2016-12-23 DIAGNOSIS — Z794 Long term (current) use of insulin: Secondary | ICD-10-CM

## 2016-12-23 DIAGNOSIS — E1140 Type 2 diabetes mellitus with diabetic neuropathy, unspecified: Secondary | ICD-10-CM

## 2016-12-23 MED ORDER — LEVOFLOXACIN 500 MG PO TABS: 500.0000 mg | ORAL_TABLET | Freq: Every day | ORAL | 0 refills | Status: AC

## 2016-12-23 NOTE — Assessment & Plan Note (Addendum)
Mild to mod, for antibx course - levaquin, to avoid augmentin or cleocin with higher risk of c diff,,  to f/u any worsening symptoms or concerns, to continue probiotic as well

## 2016-12-23 NOTE — Progress Notes (Signed)
Subjective:    Patient ID: Tanner [name], male    DOB: [birth year], 71 y.o.   MRN: [id number]  HPI  Here to f/u with c/o 7/4 onset left facial swelling and persistent tenderness, worse to push the tip of tongue to cheek but hard to tell discrete area except points to the parotid area; has no TMJ like symptoms of pain with chewing or talking, and denies sinus pain or congestion, ear pain, other HA, fever, chills and overall just not getting better.  Has hx of difficult to clear c diff x 3, almost tx with fecal transplant at last episode.  Tylenol has helped pain , but o/w nothing else makes better or worse. Pt denies chest pain, increased sob or doe, wheezing, orthopnea, PND, increased LE swelling, palpitations, dizziness or syncope.   Pt denies polydipsia, polyuria Past Medical History:  Diagnosis Date  . Acute kidney failure ([place])   . Arthritis   . Back pain   . BPH (benign prostatic hypertrophy)   . Clostridium difficile infection   . Clotting disorder ([place])   . Depression   . Diabetes ([place]) 12/11/2016  . DVT (deep venous thrombosis) ([place])   . Hypertension   . Low back pain potentially associated with spinal stenosis   . Neuromuscular disorder ([place])   . Neuropathy of lower extremity    bilateral  . OSA (obstructive sleep apnea)   . PE (pulmonary embolism)   . Ventral hernia    Past Surgical History:  Procedure Laterality Date  . EYE SURGERY    . HERNIA REPAIR      reports that he quit smoking about 15 years ago. His smoking use included Cigarettes. He started smoking about 48 years ago. He has a 66.00 pack-year smoking history. He has never used smokeless tobacco. He reports that he drinks alcohol. He reports that he does not use drugs. family history includes Alzheimer's disease in his mother; Diabetes in his mother; Pneumonia in his mother. Allergies  Allergen Reactions  . Codeine Sulfate Itching and Nausea Only   Current Outpatient Prescriptions on File Prior to Visit    Medication Sig Dispense Refill  . acetaminophen (TYLENOL) 500 MG tablet Take 1,000 mg by mouth as needed.    Marland Kitchen aspirin 81 MG tablet Take 162 mg by mouth daily.    Marland Kitchen atenolol (TENORMIN) 25 MG tablet Take 12.5 mg by mouth daily.    Marland Kitchen buPROPion (WELLBUTRIN SR) 150 MG 12 hr tablet Take 150 mg by mouth 2 (two) times daily.    . cetirizine (ZYRTEC) 10 MG tablet Take 1 tablet (10 mg total) by mouth daily. 30 tablet 11  . finasteride (PROSCAR) 5 MG tablet Take 5 mg by mouth at bedtime.    . furosemide (LASIX) 20 MG tablet Take 20 mg by mouth every other day.    . gabapentin (NEURONTIN) 300 MG capsule 600 mg. Take 3 tablets twice daily    . hydrochlorothiazide (HYDRODIURIL) 25 MG tablet Take 25 mg by mouth daily.    Marland Kitchen ketoconazole (NIZORAL) 2 % shampoo Apply topically daily.    . Menthol-Methyl Salicylate (THERA-GESIC PLUS) CREA Apply 1 application topically at bedtime. Apply to lower back    . metFORMIN (GLUCOPHAGE-XR) 500 MG 24 hr tablet Take 1 tablet (500 mg total) by mouth daily with breakfast. 90 tablet 3  . Multiple Vitamins-Minerals (MULTIVITAMINS THER. W/MINERALS) TABS Take 1 tablet by mouth daily.    . potassium chloride SA (K-DUR,KLOR-CON) 20 MEQ tablet Take 20 mEq  by mouth as needed.    . traMADol (ULTRAM) 50 MG tablet Take 100 mg by mouth 3 (three) times daily. Takes 2 tabs    . zolpidem (AMBIEN) 10 MG tablet Take 1 tablet (10 mg total) by mouth at bedtime. For sleep 30 tablet 0   No current facility-administered medications on file prior to visit.    Review of Systems  Constitutional: Negative for other unusual diaphoresis or sweats HENT: Negative for ear discharge or swelling Eyes: Negative for other worsening visual disturbances Respiratory: Negative for stridor or other swelling  Gastrointestinal: Negative for worsening distension or other blood Genitourinary: Negative for retention or other urinary change Musculoskeletal: Negative for other MSK pain or swelling Skin: Negative  for color change or other new lesions Neurological: Negative for worsening tremors and other numbness  Psychiatric/Behavioral: Negative for worsening agitation or other fatigue All other system neg per pt    Objective:   Physical Exam BP 118/70   Pulse 63   Ht 5' 5.7" (1.669 m)   Wt 294 lb (133.4 kg)   SpO2 97%   BMI 47.89 kg/m  VS noted, non toxic Constitutional: Pt appears in NAD HENT: Head: NCAT.  Right Ear: External ear normal.  Left Ear: External ear normal.  Bilat tm's clear, sinus nontender Left face with parotid area nondiscrete area of swelling, tender trace to 1+ without overlying skin or oral mucosa change Eyes: . Pupils are equal, round, and reactive to light. Conjunctivae and EOM are normal Nose: without d/c or deformity Neck: Neck supple. Gross normal ROM Cardiovascular: Normal rate and regular rhythm.   Pulmonary/Chest: Effort normal and breath sounds without rales or wheezing.  Neurological: Pt is alert. At baseline orientation, motor grossly intact, cn 2-12 intact Skin: Skin is warm. No rashes, other new lesions, no LE edema Psychiatric: Pt behavior is normal without agitation  No other exam findings Lab Results  Component Value Date   WBC 10.4 06/23/2016   HGB 14.6 06/23/2016   HCT 43.5 06/23/2016   PLT 233.0 06/23/2016   GLUCOSE 90 01/23/2014   CHOL 91 11/07/2014   TRIG 235 (A) 11/07/2014   HDL 29 (A) 11/07/2014   LDLDIRECT 65.9 09/13/2007   LDLCALC 43 11/07/2014   ALT 29 11/07/2014   AST 17 11/07/2014   NA 140 11/07/2014   K 3.8 11/07/2014   CL 104 01/23/2014   CREATININE 1.2 11/07/2014   BUN 18 11/07/2014   CO2 24 01/23/2014   TSH 2.28 09/13/2007   PSA 1.09 09/13/2007   INR 1.13 12/19/2013   HGBA1C 5.8 11/07/2014       Assessment & Plan:

## 2016-12-23 NOTE — Assessment & Plan Note (Signed)
stable overall by history and exam, recent data reviewed with pt, and pt to continue medical treatment as before,  to f/u any worsening symptoms or concerns Lab Results  Component Value Date   HGBA1C 5.8 11/07/2014

## 2016-12-23 NOTE — Patient Instructions (Signed)
Please take all new medication as prescribed - the levaquin  Please continue all other medications as before, and refills have been done if requested.  Please have the pharmacy call with any other refills you may need  Please keep your appointments with your specialists as you may have planned   

## 2016-12-23 NOTE — Assessment & Plan Note (Signed)
Resolved, but to follow closely with further antibx

## 2016-12-29 ENCOUNTER — Encounter: Payer: Self-pay | Admitting: *Deleted

## 2016-12-29 NOTE — Congregational Nurse Program (Signed)
[id number]/ASNKNL Davis,BSN,RN3,CCM: Patient came into office at [facility] with one day old abrasions to the left lower arm/skin tears present but no lacerations/fell while mowing grass/no other trauma or injuries present/no loc at time of fall/patient did wash the area well with soap and water and placed a light dressing over.  Damaged skin is intact over the area and patient encouraged to leave areas open to the air and keep clean/light dressing at night if rubbing up against bedcovers/may use light covering of neosporin ointment if no allergies/nkda/sign of infection reviewed with patient and will watch for any redness or swelling or purulent discharge from the areas.  Patient satisfied with information and plan of care.

## 2017-01-22 ENCOUNTER — Ambulatory Visit (INDEPENDENT_AMBULATORY_CARE_PROVIDER_SITE_OTHER): Payer: Medicare HMO | Admitting: Internal Medicine

## 2017-01-22 ENCOUNTER — Other Ambulatory Visit (INDEPENDENT_AMBULATORY_CARE_PROVIDER_SITE_OTHER): Payer: Medicare HMO

## 2017-01-22 ENCOUNTER — Encounter: Payer: Self-pay | Admitting: Internal Medicine

## 2017-01-22 VITALS — BP 126/78 | HR 68 | Ht 65.7 in | Wt 292.0 lb

## 2017-01-22 DIAGNOSIS — R69 Illness, unspecified: Secondary | ICD-10-CM | POA: Diagnosis not present

## 2017-01-22 DIAGNOSIS — E1140 Type 2 diabetes mellitus with diabetic neuropathy, unspecified: Secondary | ICD-10-CM

## 2017-01-22 DIAGNOSIS — R221 Localized swelling, mass and lump, neck: Secondary | ICD-10-CM

## 2017-01-22 DIAGNOSIS — Z794 Long term (current) use of insulin: Secondary | ICD-10-CM

## 2017-01-22 DIAGNOSIS — F411 Generalized anxiety disorder: Secondary | ICD-10-CM

## 2017-01-22 LAB — BASIC METABOLIC PANEL
BUN: 14 mg/dL (ref 6–23)
CHLORIDE: 101 meq/L (ref 96–112)
CO2: 33 meq/L — AB (ref 19–32)
CREATININE: 1.15 mg/dL (ref 0.40–1.50)
Calcium: 9.4 mg/dL (ref 8.4–10.5)
GFR: 66.4 mL/min (ref >60.00)
Glucose, Bld: 156 mg/dL — ABNORMAL HIGH (ref 70–99)
Potassium: 3.5 mEq/L (ref 3.5–5.1)
Sodium: 140 mEq/L (ref 135–145)

## 2017-01-22 MED ORDER — HYDROCODONE-ACETAMINOPHEN 5-325 MG PO TABS: 1.0000 | ORAL_TABLET | Freq: Four times a day (QID) | ORAL | 0 refills | Status: DC | PRN

## 2017-01-22 NOTE — Assessment & Plan Note (Signed)
stable overall by history and exam, recent data reviewed with pt, and pt to continue medical treatment as before,  to f/u any worsening symptoms or concerns Lab Results  Component Value Date   HGBA1C 5.8 11/07/2014

## 2017-01-22 NOTE — Patient Instructions (Addendum)
Please take all new medication as prescribed - the hydrocodone  Please continue all other medications as before, and refills have been done if requested.  Please have the pharmacy call with any other refills you may need.  Please continue your efforts at being more active, low cholesterol diet, and weight control.  You are otherwise up to date with prevention measures today.  Please keep your appointments with your specialists as you may have planned  You will be contacted regarding the referral for: ENT referral and Neck CT scan  Please go to the LAB in the Basement (turn left off the elevator) for the tests to be done today  You will be contacted by phone if any changes need to be made immediately.  Otherwise, you will receive a letter about your results with an explanation, but please check with MyChart first.  Please remember to sign up for MyChart if you have not done so, as this will be important to you in the future with finding out test results, communicating by private email, and scheduling acute appointments online when needed.

## 2017-01-22 NOTE — Progress Notes (Signed)
Subjective:    Patient ID: Tanner [name], male    DOB: 12/19/1945, 71 y.o.   MRN: [id number]  HPI  Here to f/u, unfortunately though maybe some improved after last visit 1 mo ago for a few days, pt still c/o moderate persistent sharp left facial pain, worse to talk or eat, and has some discomfort to LT of the left cheek, and overall assoc with left check and left lower jaw/neck swelling and tender.   Pt denies fever, wt loss, night sweats, loss of appetite, or other constitutional symptoms Pt denies new neurological symptoms such as new headache, or facial or extremity weakness or numbness   Pt denies polydipsia, polyuria Wt Readings from Last 3 Encounters:  01/22/17 292 lb (132.5 kg)  12/23/16 294 lb (133.4 kg)  12/11/16 295 lb (133.8 kg)  Did incidentally have a fall with bruise to left arm last wk, but no other injury. Past Medical History:  Diagnosis Date  . Acute kidney failure ([place])   . Arthritis   . Back pain   . BPH (benign prostatic hypertrophy)   . Clostridium difficile infection   . Clotting disorder ([place])   . Depression   . Diabetes ([place]) 12/11/2016  . DVT (deep venous thrombosis) ([place])   . Hypertension   . Low back pain potentially associated with spinal stenosis   . Neuromuscular disorder ([place])   . Neuropathy of lower extremity    bilateral  . OSA (obstructive sleep apnea)   . PE (pulmonary embolism)   . Ventral hernia    Past Surgical History:  Procedure Laterality Date  . EYE SURGERY    . HERNIA REPAIR      reports that he quit smoking about 15 years ago. His smoking use included Cigarettes. He started smoking about 48 years ago. He has a 66.00 pack-year smoking history. He has never used smokeless tobacco. He reports that he drinks alcohol. He reports that he does not use drugs. family history includes Alzheimer's disease in his mother; Diabetes in his mother; Pneumonia in his mother. Allergies  Allergen Reactions  . Codeine Sulfate Itching and Nausea Only    Current Outpatient Prescriptions on File Prior to Visit  Medication Sig Dispense Refill  . acetaminophen (TYLENOL) 500 MG tablet Take 1,000 mg by mouth as needed.    Marland Kitchen aspirin 81 MG tablet Take 162 mg by mouth daily.    Marland Kitchen atenolol (TENORMIN) 25 MG tablet Take 12.5 mg by mouth daily.    Marland Kitchen buPROPion (WELLBUTRIN SR) 150 MG 12 hr tablet Take 150 mg by mouth 2 (two) times daily.    . cetirizine (ZYRTEC) 10 MG tablet Take 1 tablet (10 mg total) by mouth daily. 30 tablet 11  . finasteride (PROSCAR) 5 MG tablet Take 5 mg by mouth at bedtime.    . furosemide (LASIX) 20 MG tablet Take 20 mg by mouth every other day.    . gabapentin (NEURONTIN) 300 MG capsule 600 mg. Take 3 tablets twice daily    . hydrochlorothiazide (HYDRODIURIL) 25 MG tablet Take 25 mg by mouth daily.    Marland Kitchen ketoconazole (NIZORAL) 2 % shampoo Apply topically daily.    . Menthol-Methyl Salicylate (THERA-GESIC PLUS) CREA Apply 1 application topically at bedtime. Apply to lower back    . metFORMIN (GLUCOPHAGE-XR) 500 MG 24 hr tablet Take 1 tablet (500 mg total) by mouth daily with breakfast. 90 tablet 3  . Multiple Vitamins-Minerals (MULTIVITAMINS THER. W/MINERALS) TABS Take 1 tablet by mouth daily.    Marland Kitchen  potassium chloride SA (K-DUR,KLOR-CON) 20 MEQ tablet Take 20 mEq by mouth as needed.    . traMADol (ULTRAM) 50 MG tablet Take 100 mg by mouth 3 (three) times daily. Takes 2 tabs    . zolpidem (AMBIEN) 10 MG tablet Take 1 tablet (10 mg total) by mouth at bedtime. For sleep 30 tablet 0   No current facility-administered medications on file prior to visit.    Review of Systems  Constitutional: Negative for other unusual diaphoresis or sweats HENT: Negative for ear discharge or swelling Eyes: Negative for other worsening visual disturbances Respiratory: Negative for stridor or other swelling  Gastrointestinal: Negative for worsening distension or other blood Genitourinary: Negative for retention or other urinary  change Musculoskeletal: Negative for other MSK pain or swelling Skin: Negative for color change or other new lesions Neurological: Negative for worsening tremors and other numbness  Psychiatric/Behavioral: Negative for worsening agitation or other fatigue All other sytem neg per pt    Objective:   Physical Exam BP 126/78   Pulse 68   Ht 5' 5.7" (1.669 m)   Wt 292 lb (132.5 kg)   SpO2 97%   BMI 47.56 kg/m  VS noted, not ill appearing Constitutional: Pt appears in NAD HENT: Head: NCAT.  Right Ear: External ear normal.  Left Ear: External ear normal.  Eyes: . Pupils are equal, round, and reactive to light. Conjunctivae and EOM are normal Nose: without d/c or deformity Left face with diffuse nondiscrete swelling primarily the left 5th nerve middle and lower dermatome areas; tender to touch but no overlying skin change or fluctuance; also with firm nodule/mass to just behind and below the angle of the jaw; left TM joint NT Neck: Neck supple. Gross normal ROM Cardiovascular: Normal rate and regular rhythm.   Pulmonary/Chest: Effort normal and breath sounds without rales or wheezing.  Neurological: Pt is alert. At baseline orientation, motor grossly intact, cn 2-12 intact Skin: Skin is warm. No rashes, other new lesions, no LE edema Psychiatric: Pt behavior is normal without agitation , 1+ nervous No other exam findings Lab Results  Component Value Date   WBC 10.4 06/23/2016   HGB 14.6 06/23/2016   HCT 43.5 06/23/2016   PLT 233.0 06/23/2016   GLUCOSE 156 (H) 01/22/2017   CHOL 91 11/07/2014   TRIG 235 (A) 11/07/2014   HDL 29 (A) 11/07/2014   LDLDIRECT 65.9 09/13/2007   LDLCALC 43 11/07/2014   ALT 29 11/07/2014   AST 17 11/07/2014   NA 140 01/22/2017   K 3.5 01/22/2017   CL 101 01/22/2017   CREATININE 1.15 01/22/2017   BUN 14 01/22/2017   CO2 33 (H) 01/22/2017   TSH 2.28 09/13/2007   PSA 1.09 09/13/2007   INR 1.13 12/19/2013   HGBA1C 5.8 11/07/2014       Assessment &  Plan:

## 2017-01-22 NOTE — Assessment & Plan Note (Addendum)
Pain, swelling and mass like effect to left lower face/upper neck near the angle of jaw; I think may be somewhat increased in size over last visit, for hydrocodone prn pain, also for CT neck with CM, and ENT referral to r/o malignancy

## 2017-01-22 NOTE — Assessment & Plan Note (Signed)
Mild situatonally worse today, reassured, cont same tx,  to f/u any worsening symptoms or concerns

## 2017-01-27 ENCOUNTER — Other Ambulatory Visit: Payer: Self-pay | Admitting: Internal Medicine

## 2017-01-27 ENCOUNTER — Ambulatory Visit
Admission: RE | Admit: 2017-01-27 | Discharge: 2017-01-27 | Disposition: A | Discharge status: Home / Self Care | Payer: Medicare HMO | Source: Ambulatory Visit | Attending: Internal Medicine | Admitting: Internal Medicine

## 2017-01-27 ENCOUNTER — Telehealth: Payer: Self-pay

## 2017-01-27 ENCOUNTER — Telehealth: Payer: Self-pay | Admitting: Internal Medicine

## 2017-01-27 DIAGNOSIS — R221 Localized swelling, mass and lump, neck: Secondary | ICD-10-CM

## 2017-01-27 MED ORDER — OXYCODONE-ACETAMINOPHEN 10-325 MG PO TABS: 1.0000 | ORAL_TABLET | Freq: Four times a day (QID) | ORAL | 0 refills | Status: DC | PRN

## 2017-01-27 NOTE — Telephone Encounter (Signed)
-----   Message from Biagio Borg, MD sent at 01/27/2017  5:32 PM EDT ----- Left message on MyChart, pt to cont same tx except  The test results show that your current treatment is OK, except the ultrasound does confirm that "something" is in the left neck area.  We now need to get the CT of the Neck (the same thing we originally ordered that was denied by the insurance).    You should be called from the office as well.  [place] to please inform pt, I will do re-order for the CT neck with CM

## 2017-01-27 NOTE — Telephone Encounter (Signed)
Spoke to pt and he states his neck is hurting pretty bad and says the medication that was given to him last week isn't helping very much.  He is getting an ultra sound done this afternoon.  Please contact pt

## 2017-01-27 NOTE — Telephone Encounter (Signed)
[place] for ultrasound - will order

## 2017-01-27 NOTE — Telephone Encounter (Signed)
Pt has been informed and expressed understanding Script at front desk

## 2017-01-27 NOTE — Telephone Encounter (Signed)
Ok for [employer] BUT do NOT take with any leftover hydrocodone - Done hardcopy to [employer]

## 2017-01-27 NOTE — Telephone Encounter (Signed)
Pt's request for a CT of the neck has been denied through insurance pending peer to peer.  They say its because there is no previous ultra sound.  Would like to have pt do ultra sound first? Please advise.

## 2017-01-27 NOTE — Addendum Note (Signed)
Addended by: Biagio Borg on: 01/27/2017 04:56 PM   Modules accepted: Orders

## 2017-01-27 NOTE — Telephone Encounter (Signed)
Pt has been informed and expressed understanding.  

## 2017-01-27 NOTE — Telephone Encounter (Signed)
Should the pt come back in for an OV? Please advise.

## 2017-01-27 NOTE — Telephone Encounter (Signed)
See below

## 2017-01-28 ENCOUNTER — Ambulatory Visit
Admission: RE | Admit: 2017-01-28 | Discharge: 2017-01-28 | Disposition: A | Discharge status: Home / Self Care | Payer: Medicare HMO | Source: Ambulatory Visit | Attending: Internal Medicine | Admitting: Internal Medicine

## 2017-01-28 ENCOUNTER — Telehealth: Payer: Self-pay

## 2017-01-28 DIAGNOSIS — R221 Localized swelling, mass and lump, neck: Secondary | ICD-10-CM | POA: Diagnosis not present

## 2017-01-28 IMAGING — CT CT NECK W/ CM
4 of 5 series · 16 of 33 positions shown, 19 images · IV contrast (75CC ISOVUE 300)
Comparison: Sonography from yesterday.

CLINICAL DATA: Evaluate left submental mass, present for 5-10 days.

EXAM:
CT NECK WITH CONTRAST
TECHNIQUE: Multidetector CT imaging of the neck was performed using the
standard protocol following the bolus administration of intravenous
contrast.
CONTRAST:  75mL [37] IOPAMIDOL ([37]) INJECTION 61%

[Series 3: axial neck · axial · 0.49mm/px · z∈[+66,+136]mm · 2 of 111 slices shown]
[im 28/111  bone]
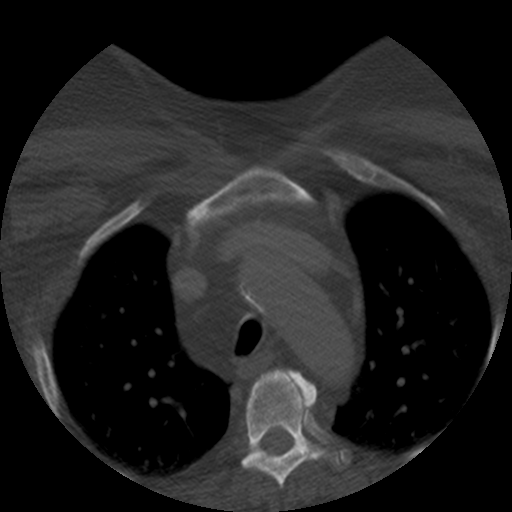
[im 56/111  bone]
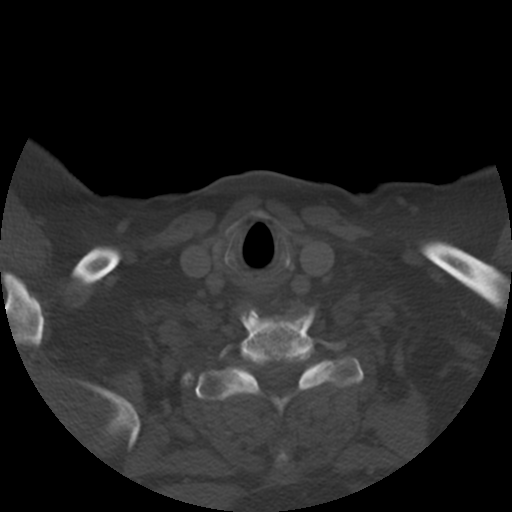

[Series 201: sagittal · sagittal · 0.55mm/px · 5 of 127 slices shown, 6 images]
[im 43/127  bone]
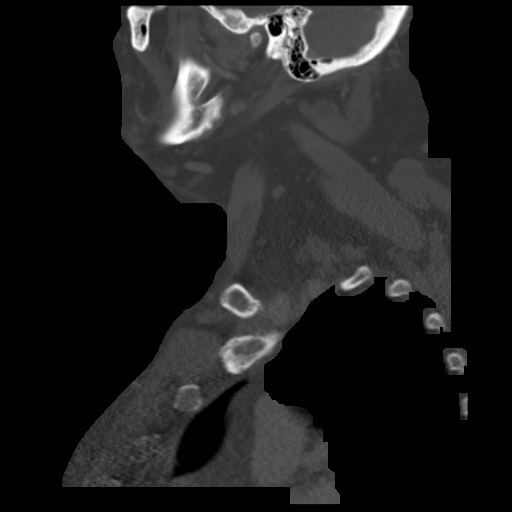
[im 53/127  bone]
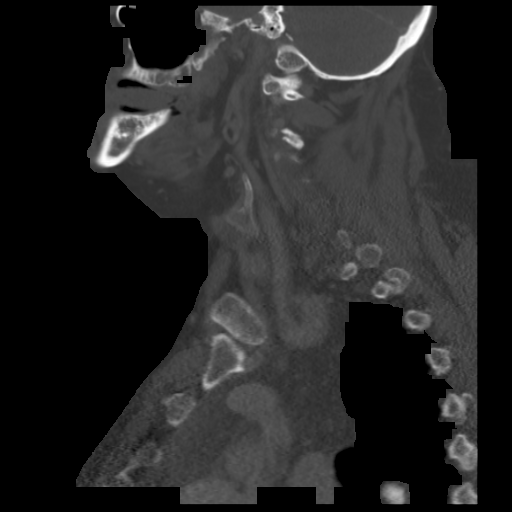
[im 64/127  soft-tissue]
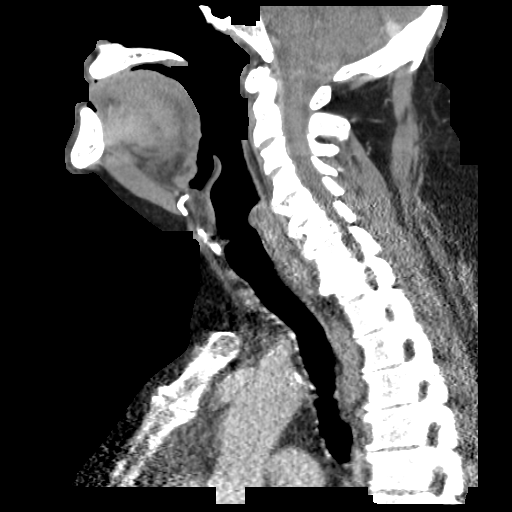
[im 64/127  bone]
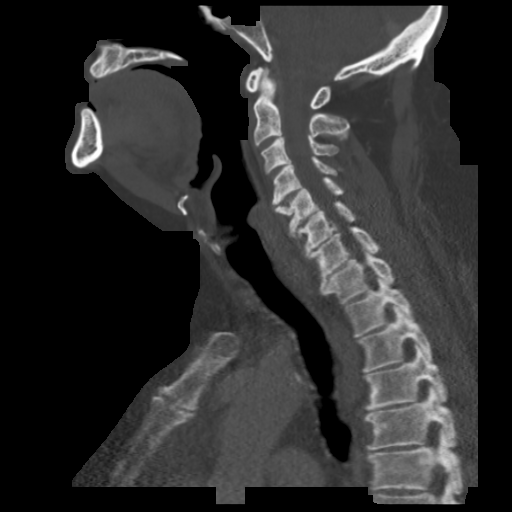
[im 74/127  bone]
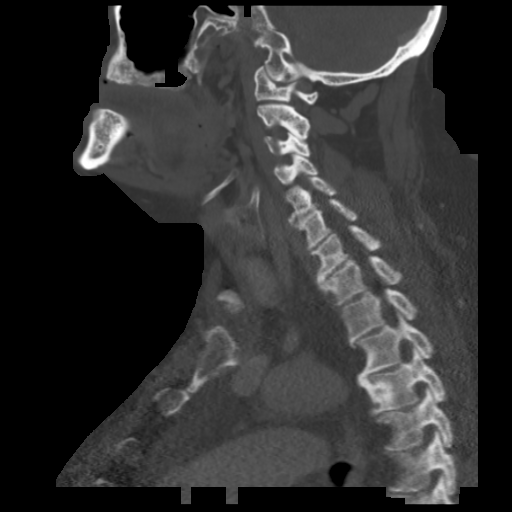
[im 85/127  bone]
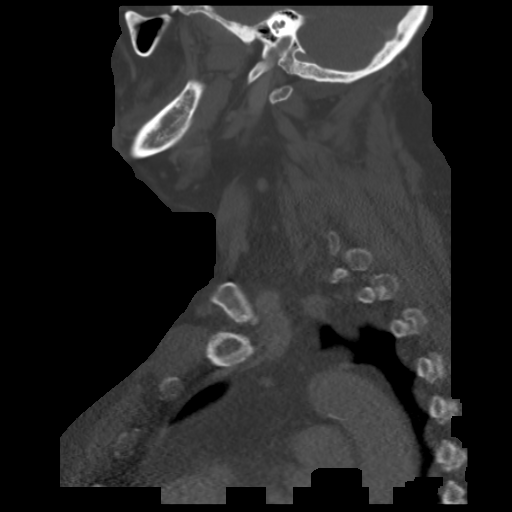

[Series 202: coronal · coronal · 0.55mm/px · 3 of 136 slices shown]
[im 42/136  bone]
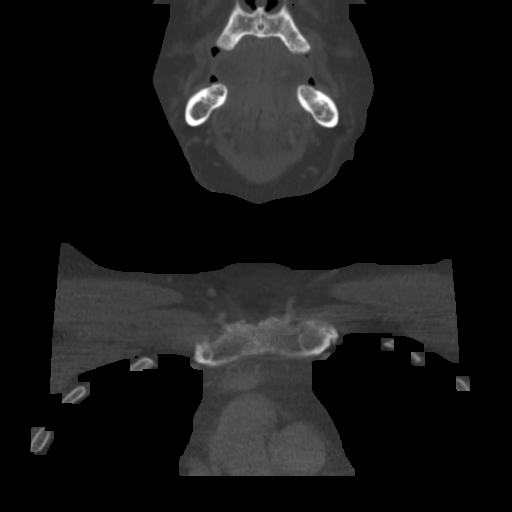
[im 59/136  bone]
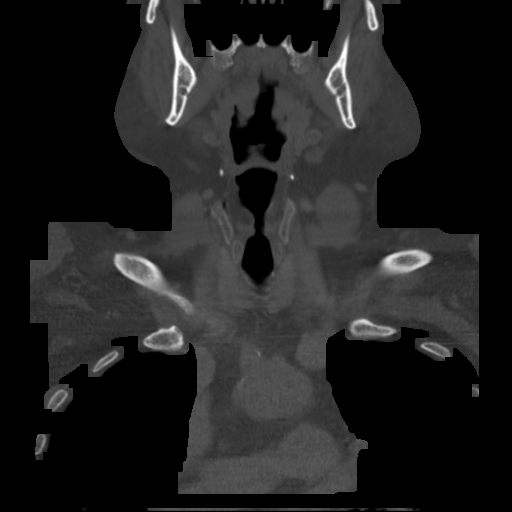
[im 77/136  bone]
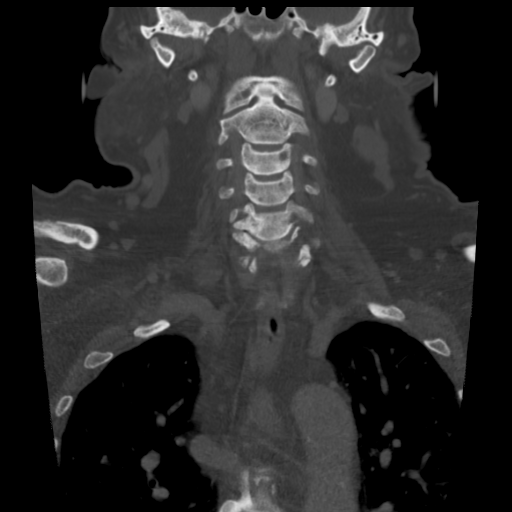

[Series 203: angled to hyoid · axial · 0.55mm/px · z∈[-39,+180]mm · 6 of 170 slices shown, 8 images]
[im 25/170  soft-tissue]
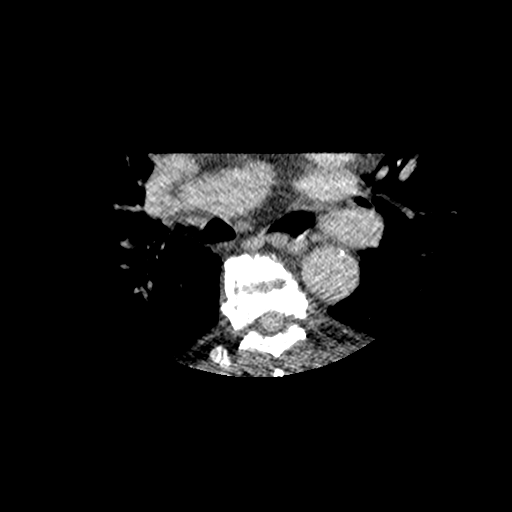
[im 25/170  bone]
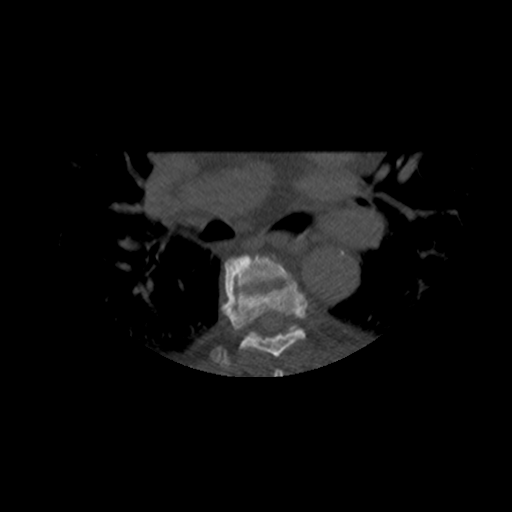
[im 49/170  bone]
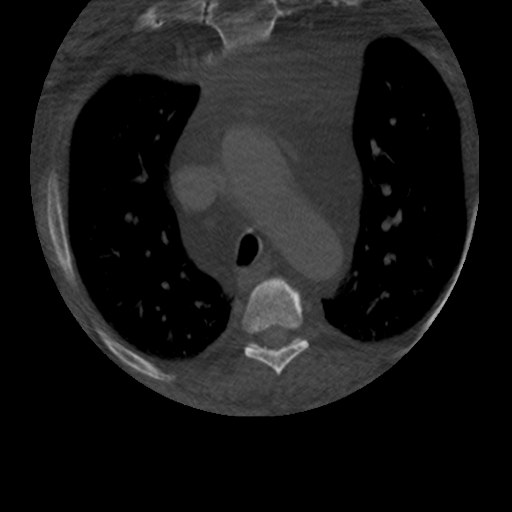
[im 73/170  bone]
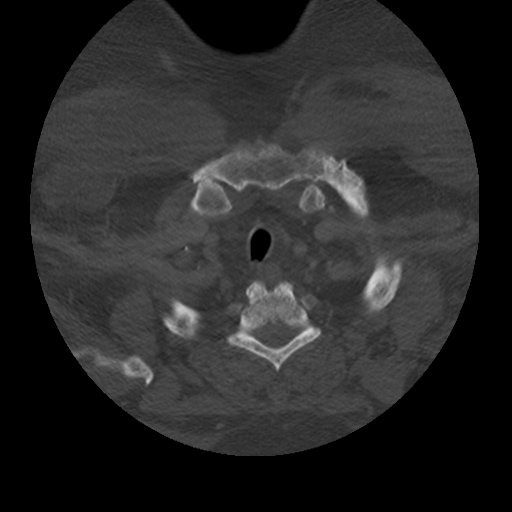
[im 97/170  bone]
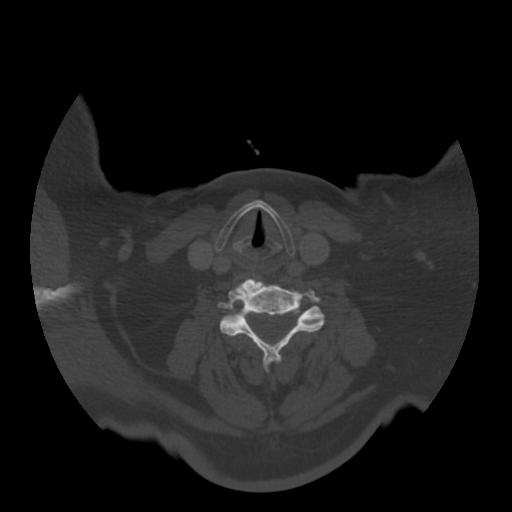
[im 121/170  soft-tissue]
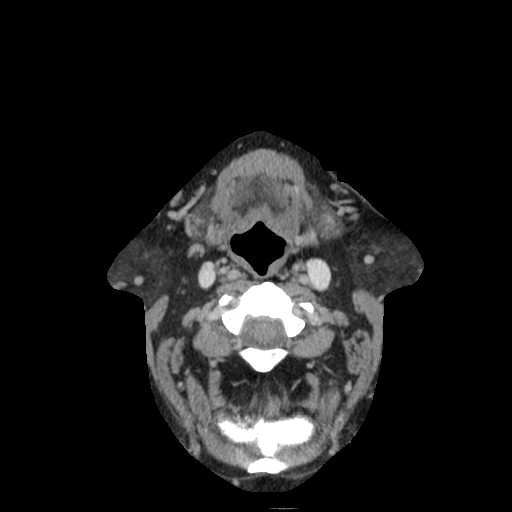
[im 121/170  bone]
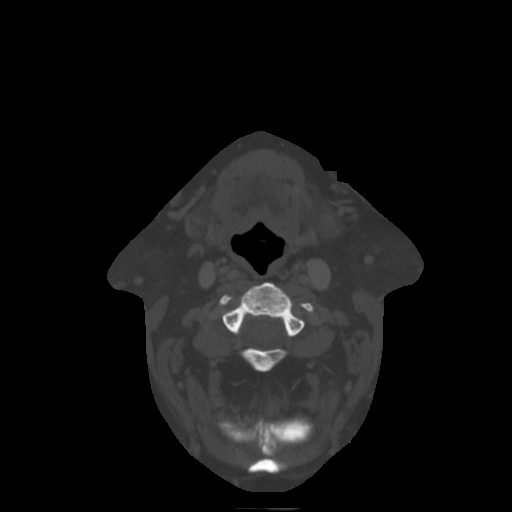
[im 145/170  bone]
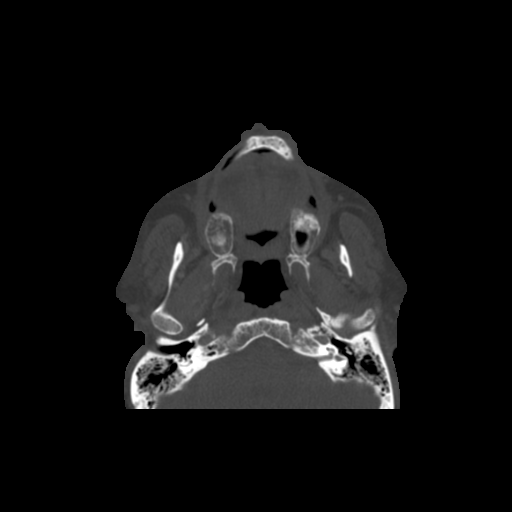

[16 of 33 positions shown; findings below may reference images not displayed]

FINDINGS: Pharynx and larynx: Normal. No mass or swelling.

Salivary glands: Unremarkable, including the left submandibular
gland which is deep to the palpable marker.

Thyroid: 6 mm low-density nodule in the left thyroid, incidental
based on small size.

Lymph nodes: None enlarged or abnormal density.

Vascular: Atherosclerotic calcification that is overall mild for
age.

Limited intracranial: Negative

Visualized orbits: Essentially not visualized

Mastoids and visualized paranasal sinuses: Clear

Skeleton: Upper cervical facet arthropathy with slight C4-5
anterolisthesis. Spondylosis.

Upper chest: NODARSE shape of the trachea and possible mild
emphysematous change.
IMPRESSION: No submandibular mass or adenopathy. No explanation for palpable
complaint or sonographic findings

## 2017-01-28 MED ORDER — IOPAMIDOL (ISOVUE-300) INJECTION 61%
75.0000 mL | Freq: Once | INTRAVENOUS | Status: AC | PRN
  Administered 2017-01-28: 75 mL via INTRAVENOUS

## 2017-01-28 NOTE — Telephone Encounter (Signed)
I am not sure but that is possible.  I see he has been referred to ENT so I would start there if he has not already seen them

## 2017-01-28 NOTE — Telephone Encounter (Signed)
Pt has been informed and expressed understanding however he would like to know what could be causing the pain? Nerve?

## 2017-01-28 NOTE — Telephone Encounter (Signed)
-----   Message from Biagio Borg, MD sent at 01/28/2017  4:10 PM EDT ----- Ok to let pt know - CT is negative for any mass  No need for further evaluation

## 2017-01-29 NOTE — Telephone Encounter (Signed)
Pt has been informed. He stated that he has not received a call from the ENT yet.Please call pt with an update if one is available.

## 2017-02-01 DIAGNOSIS — R6884 Jaw pain: Secondary | ICD-10-CM | POA: Diagnosis not present

## 2017-03-12 ENCOUNTER — Other Ambulatory Visit (INDEPENDENT_AMBULATORY_CARE_PROVIDER_SITE_OTHER): Payer: Medicare HMO

## 2017-03-12 DIAGNOSIS — Z794 Long term (current) use of insulin: Secondary | ICD-10-CM

## 2017-03-12 DIAGNOSIS — E1140 Type 2 diabetes mellitus with diabetic neuropathy, unspecified: Secondary | ICD-10-CM

## 2017-03-12 LAB — HEPATIC FUNCTION PANEL
ALK PHOS: 74 U/L (ref 39–117)
ALT: 15 U/L (ref 0–53)
AST: 15 U/L (ref 0–37)
Albumin: 3.8 g/dL (ref 3.5–5.2)
BILIRUBIN DIRECT: 0.1 mg/dL (ref 0.0–0.3)
BILIRUBIN TOTAL: 0.7 mg/dL (ref 0.2–1.2)
Total Protein: 6 g/dL (ref 6.0–8.3)

## 2017-03-12 LAB — HEMOGLOBIN A1C: Hgb A1c MFr Bld: 6.3 % (ref 4.6–6.5)

## 2017-03-12 LAB — BASIC METABOLIC PANEL
BUN: 13 mg/dL (ref 6–23)
CHLORIDE: 98 meq/L (ref 96–112)
CO2: 30 mEq/L (ref 19–32)
Calcium: 9.1 mg/dL (ref 8.4–10.5)
Creatinine, Ser: 1.17 mg/dL (ref 0.40–1.50)
GFR: 65.06 mL/min (ref >60.00)
Glucose, Bld: 128 mg/dL — ABNORMAL HIGH (ref 70–99)
Potassium: 3.7 mEq/L (ref 3.5–5.1)
Sodium: 137 mEq/L (ref 135–145)

## 2017-03-12 LAB — LIPID PANEL
CHOL/HDL RATIO: 3
Cholesterol: 92 mg/dL (ref 0–200)
HDL: 27.4 mg/dL — AB (ref >39.00)
LDL Cholesterol: 33 mg/dL (ref 0–99)
NonHDL: 64.93
TRIGLYCERIDES: 161 mg/dL — AB (ref 0.0–149.0)
VLDL: 32.2 mg/dL (ref 0.0–40.0)

## 2017-03-17 ENCOUNTER — Encounter: Payer: Self-pay | Admitting: Internal Medicine

## 2017-03-17 ENCOUNTER — Ambulatory Visit (INDEPENDENT_AMBULATORY_CARE_PROVIDER_SITE_OTHER): Payer: Medicare HMO | Admitting: Internal Medicine

## 2017-03-17 VITALS — BP 138/86 | HR 74 | Temp 98.5°F | Ht 65.7 in | Wt 289.0 lb

## 2017-03-17 DIAGNOSIS — Z23 Encounter for immunization: Secondary | ICD-10-CM | POA: Diagnosis not present

## 2017-03-17 DIAGNOSIS — Z794 Long term (current) use of insulin: Secondary | ICD-10-CM

## 2017-03-17 DIAGNOSIS — Z0001 Encounter for general adult medical examination with abnormal findings: Secondary | ICD-10-CM

## 2017-03-17 DIAGNOSIS — Z0000 Encounter for general adult medical examination without abnormal findings: Secondary | ICD-10-CM

## 2017-03-17 DIAGNOSIS — R200 Anesthesia of skin: Secondary | ICD-10-CM

## 2017-03-17 DIAGNOSIS — E1140 Type 2 diabetes mellitus with diabetic neuropathy, unspecified: Secondary | ICD-10-CM | POA: Diagnosis not present

## 2017-03-17 DIAGNOSIS — G629 Polyneuropathy, unspecified: Secondary | ICD-10-CM | POA: Insufficient documentation

## 2017-03-17 DIAGNOSIS — Z1159 Encounter for screening for other viral diseases: Secondary | ICD-10-CM | POA: Diagnosis not present

## 2017-03-17 NOTE — Patient Instructions (Addendum)
You had the flu shot today  Please continue all other medications as before, and refills have been done if requested.  Please have the pharmacy call with any other refills you may need.  Please continue your efforts at being more active, low cholesterol diet, and weight control.  You are otherwise up to date with prevention measures today.  Please keep your appointments with your specialists as you may have planned  You will be contacted regarding the referral for: Neurology  Please return in 6 months, or sooner if needed, with Lab testing done 3-5 days before

## 2017-03-17 NOTE — Assessment & Plan Note (Signed)
stable overall by history and exam, recent data reviewed with pt, and pt to continue medical treatment as before,  to f/u any worsening symptoms or concerns  

## 2017-03-17 NOTE — Progress Notes (Signed)
Subjective:    Patient ID: Tanner [name], male    DOB: Apr 19, 1946, 71 y.o.   MRN: [id number]  HPI  Here for wellness and f/u;  Overall doing ok;  Pt denies Chest pain, worsening SOB, DOE, wheezing, orthopnea, PND, worsening LE edema, palpitations, dizziness or syncope.  Pt denies neurological change such as new headache, facial or extremity weakness.  Pt denies polydipsia, polyuria, or low sugar symptoms. Pt states overall good compliance with treatment and medications, good tolerability, and has been trying to follow appropriate diet.  Pt denies worsening depressive symptoms, suicidal ideation or panic. No fever, night sweats, wt loss, loss of appetite, or other constitutional symptoms.  Pt states good ability with ADL's, has low fall risk, home safety reviewed and adequate, no other significant changes in hearing or vision, and not active with exercise. Has nov 2018 eye exam scheduled at the [place].  Has PSA and other labs done at [place].  Still having left lower face issue with now pain and numbness after recent tx for ? Parotitis and supernumary tooth surgury, asks to see Neurology Past Medical History:  Diagnosis Date  . Acute kidney failure ([place])   . Arthritis   . Back pain   . BPH (benign prostatic hypertrophy)   . Clostridium difficile infection   . Clotting disorder ([place])   . Depression   . Diabetes ([place]) 12/11/2016  . DVT (deep venous thrombosis) ([place])   . Hypertension   . Low back pain potentially associated with spinal stenosis   . Neuromuscular disorder ([place])   . Neuropathy of lower extremity    bilateral  . OSA (obstructive sleep apnea)   . PE (pulmonary embolism)   . Ventral hernia    Past Surgical History:  Procedure Laterality Date  . EYE SURGERY    . HERNIA REPAIR      reports that he quit smoking about 15 years ago. His smoking use included Cigarettes. He started smoking about 48 years ago. He has a 66.00 pack-year smoking history. He has never used smokeless tobacco. He  reports that he drinks alcohol. He reports that he does not use drugs. family history includes Alzheimer's disease in his mother; Diabetes in his mother; Pneumonia in his mother. Allergies  Allergen Reactions  . Codeine Sulfate Itching and Nausea Only   Current Outpatient Prescriptions on File Prior to Visit  Medication Sig Dispense Refill  . acetaminophen (TYLENOL) 500 MG tablet Take 1,000 mg by mouth as needed.    Marland Kitchen aspirin 81 MG tablet Take 162 mg by mouth daily.    Marland Kitchen atenolol (TENORMIN) 25 MG tablet Take 12.5 mg by mouth daily.    Marland Kitchen buPROPion (WELLBUTRIN SR) 150 MG 12 hr tablet Take 150 mg by mouth 2 (two) times daily.    . cetirizine (ZYRTEC) 10 MG tablet Take 1 tablet (10 mg total) by mouth daily. 30 tablet 11  . finasteride (PROSCAR) 5 MG tablet Take 5 mg by mouth at bedtime.    . furosemide (LASIX) 20 MG tablet Take 20 mg by mouth every other day.    . gabapentin (NEURONTIN) 300 MG capsule 600 mg. Take 3 tablets twice daily    . hydrochlorothiazide (HYDRODIURIL) 25 MG tablet Take 25 mg by mouth daily.    Marland Kitchen HYDROcodone-acetaminophen (NORCO/VICODIN) 5-325 MG tablet Take 1 tablet by mouth every 6 (six) hours as needed for moderate pain. 30 tablet 0  . ketoconazole (NIZORAL) 2 % shampoo Apply topically daily.    . Menthol-Methyl  Salicylate (THERA-GESIC PLUS) CREA Apply 1 application topically at bedtime. Apply to lower back    . metFORMIN (GLUCOPHAGE-XR) 500 MG 24 hr tablet Take 1 tablet (500 mg total) by mouth daily with breakfast. 90 tablet 3  . Multiple Vitamins-Minerals (MULTIVITAMINS THER. W/MINERALS) TABS Take 1 tablet by mouth daily.    Marland Kitchen oxyCODONE-acetaminophen (PERCOCET) 10-325 MG tablet Take 1 tablet by mouth every 6 (six) hours as needed for pain. 30 tablet 0  . potassium chloride SA (K-DUR,KLOR-CON) 20 MEQ tablet Take 20 mEq by mouth as needed.    . zolpidem (AMBIEN) 10 MG tablet Take 1 tablet (10 mg total) by mouth at bedtime. For sleep 30 tablet 0   No current  facility-administered medications on file prior to visit.    Review of Systems Constitutional: Negative for other unusual diaphoresis, sweats, appetite or weight changes HENT: Negative for other worsening hearing loss, ear pain, facial swelling, mouth sores or neck stiffness.   Eyes: Negative for other worsening pain, redness or other visual disturbance.  Respiratory: Negative for other stridor or swelling Cardiovascular: Negative for other palpitations or other chest pain  Gastrointestinal: Negative for worsening diarrhea or loose stools, blood in stool, distention or other pain Genitourinary: Negative for hematuria, flank pain or other change in urine volume.  Musculoskeletal: Negative for myalgias or other joint swelling.  Skin: Negative for other color change, or other wound or worsening drainage.  Neurological: Negative for other syncope or numbness. Hematological: Negative for other adenopathy or swelling Psychiatric/Behavioral: Negative for hallucinations, other worsening agitation, SI, self-injury, or new decreased concentration All other system neg per pt    Objective:   Physical Exam BP 138/86   Pulse 74   Temp 98.5 F (36.9 C) (Oral)   Ht 5' 5.7" (1.669 m)   Wt 289 lb (131.1 kg)   SpO2 96%   BMI 47.07 kg/m  VS noted, morbid obese Constitutional: Pt is oriented to person, place, and time. Appears well-developed and well-nourished, in no significant distress and comfortable Head: Normocephalic and atraumatic  Eyes: Conjunctivae and EOM are normal. Pupils are equal, round, and reactive to light Right Ear: External ear normal without discharge Left Ear: External ear normal without discharge Nose: Nose without discharge or deformity Mouth/Throat: Oropharynx is without other ulcerations and moist  Neck: Normal range of motion. Neck supple. No JVD present. No tracheal deviation present or significant neck LA or mass Cardiovascular: Normal rate, regular rhythm, normal heart  sounds and intact distal pulses.   Pulmonary/Chest: WOB normal and breath sounds without rales or wheezing  Abdominal: Soft. Bowel sounds are normal. NT. No HSM  Musculoskeletal: Normal range of motion.  Lymphadenopathy: Has no other cervical adenopathy.  Neurological: Pt is alert and oriented to person, place, and time. Pt has normal reflexes. No cranial nerve deficit. Motor grossly intact, Gait intact, has reduced sensation to the feet Skin: Skin is warm and dry. No rash noted or new ulcerations, has chronic bilat 1+ edema Psychiatric:  Has normal mood and affect. Behavior is normal without agitation No other exam finding Lab Results  Component Value Date   WBC 10.4 06/23/2016   HGB 14.6 06/23/2016   HCT 43.5 06/23/2016   PLT 233.0 06/23/2016   GLUCOSE 128 (H) 03/12/2017   CHOL 92 03/12/2017   TRIG 161.0 (H) 03/12/2017   HDL 27.40 (L) 03/12/2017   LDLDIRECT 65.9 09/13/2007   LDLCALC 33 03/12/2017   ALT 15 03/12/2017   AST 15 03/12/2017   NA 137 03/12/2017  K 3.7 03/12/2017   CL 98 03/12/2017   CREATININE 1.17 03/12/2017   BUN 13 03/12/2017   CO2 30 03/12/2017   TSH 2.28 09/13/2007   PSA 1.09 09/13/2007   INR 1.13 12/19/2013   HGBA1C 6.3 03/12/2017        Assessment & Plan:

## 2017-03-17 NOTE — Assessment & Plan Note (Signed)
With persistent pain, ok for neurology referral

## 2017-03-17 NOTE — Assessment & Plan Note (Signed)

## 2017-03-19 ENCOUNTER — Encounter: Payer: Self-pay | Admitting: Neurology

## 2017-03-25 ENCOUNTER — Telehealth: Payer: Self-pay | Admitting: *Deleted

## 2017-03-25 MED ORDER — OXYCODONE-ACETAMINOPHEN 10-325 MG PO TABS: 1.0000 | ORAL_TABLET | Freq: Four times a day (QID) | ORAL | 0 refills | Status: DC | PRN

## 2017-03-25 NOTE — Telephone Encounter (Signed)
Pt notified Script at front desk 

## 2017-03-25 NOTE — Telephone Encounter (Signed)
Left msg on triage requesting refill on the pain med Oxycodone. She states his appt is not until 04/13/17 w/ the neurologist.../lmb

## 2017-03-25 NOTE — Telephone Encounter (Signed)
Done hardcopy to [place]  

## 2017-04-06 ENCOUNTER — Telehealth: Payer: Self-pay | Admitting: Neurology

## 2017-04-06 NOTE — Telephone Encounter (Signed)
Returning a call to the office. Thanks

## 2017-04-07 ENCOUNTER — Ambulatory Visit (INDEPENDENT_AMBULATORY_CARE_PROVIDER_SITE_OTHER): Payer: Medicare HMO | Admitting: Neurology

## 2017-04-07 ENCOUNTER — Other Ambulatory Visit (INDEPENDENT_AMBULATORY_CARE_PROVIDER_SITE_OTHER): Payer: Medicare HMO

## 2017-04-07 ENCOUNTER — Encounter: Payer: Self-pay | Admitting: Neurology

## 2017-04-07 VITALS — BP 140/86 | HR 75 | Ht 67.0 in | Wt 288.5 lb

## 2017-04-07 DIAGNOSIS — G500 Trigeminal neuralgia: Secondary | ICD-10-CM | POA: Diagnosis not present

## 2017-04-07 DIAGNOSIS — R202 Paresthesia of skin: Secondary | ICD-10-CM

## 2017-04-07 DIAGNOSIS — E1142 Type 2 diabetes mellitus with diabetic polyneuropathy: Secondary | ICD-10-CM | POA: Diagnosis not present

## 2017-04-07 DIAGNOSIS — Z8669 Personal history of other diseases of the nervous system and sense organs: Secondary | ICD-10-CM | POA: Diagnosis not present

## 2017-04-07 LAB — VITAMIN B12: Vitamin B-12: >1500 pg/mL — ABNORMAL HIGH (ref 211–911)

## 2017-04-07 LAB — SEDIMENTATION RATE: SED RATE: 20 mm/h (ref 0–20)

## 2017-04-07 LAB — C-REACTIVE PROTEIN: CRP: 2.4 mg/dL (ref 0.5–20.0)

## 2017-04-07 MED ORDER — NORTRIPTYLINE HCL 10 MG PO CAPS: ORAL_CAPSULE | ORAL | 5 refills | Status: DC

## 2017-04-07 NOTE — Progress Notes (Signed)
[facility] Clinic Note - Initial Visit   Date: 04/07/17  KEEGHAN BIALY MRN: [id number] DOB: [birth year]   Dear Dr. Jenny Reichmann:   Thank you for your kind referral of Tanner [name] for consultation of left facial pain. Although his history is well known to you, please allow [name] to reiterate it for the purpose of our medical record. The patient was accompanied to the clinic by self.  History of Present Illness: Tanner [name] is a 71 y.o. right-handed Caucasian male with diabetes complicated by neuropathy, hypertension, OSA, depression, right Bell's palsy with residual facial weakness, and BPH presenting for evaluation of left facial pain.    He started having achy pain over the left upper gums in early August.  He was treated with antibiotics for parotiditis.  This did not help and made pain worse over the left upper and lower gums.   Initially US of the neck showed ill-defined mass measuring 0.6 x 1.7 x 1.3 cm in the subcutaneous fat.  However, this findings was not apparent on CT neck which was normal.  He also saw ENT whose evaluation did not disclose any reason for this pain and referred to see his dentist.  He was found to have supernumary tooth and was referred to [facility] surgeon who extracted his tooth.  I am not sure if there was any local nerve block, patient reports getting nitrous oxide.  Following this, his pain had completely resolved for about a week.  Then, his pain returned much more severe with burning sensation over the left side of the lips and into his jaw, not involving the skin over the cheek or chin.  The lips are very hypersensitive.  He cannot drink coffee because it makes the pain worse.  When he wakes up, he usually takes a oxycodone which controls his pain until noon and then gets worse around 3pm. He takes another oxycodone at 3pm and 6pm, which provides adequate relief and allows him to eat meals and sleep.  He denies any numbness/tingling of  the left side of the face/lips.  He denies any facial weakness of the left side, change taste, or difficulty swallowing.  He has right facial weakness from two episodes of Bell's palsy.    Out-side paper records, electronic medical record, and images have been reviewed where available and summarized as:  US neck 01/27/2017:  The palpable abnormality in the left submandibular corresponds to a nonspecific subcutaneous mass as described. CT neck with contrast is recommended to further characterize.  CT neck wwo contrast 01/28/2017: No submandibular mass or adenopathy. No explanation for palpable complaint or sonographic findings    Past Medical History:  Diagnosis Date  . Acute kidney failure ([place])   . Arthritis   . Back pain   . BPH (benign prostatic hypertrophy)   . Clostridium difficile infection   . Clotting disorder ([place])   . Depression   . Diabetes ([place]) 12/11/2016  . DVT (deep venous thrombosis) ([place])   . Hypertension   . Low back pain potentially associated with spinal stenosis   . Neuromuscular disorder ([place])   . Neuropathy of lower extremity    bilateral  . OSA (obstructive sleep apnea)   . PE (pulmonary embolism)   . Ventral hernia     Past Surgical History:  Procedure Laterality Date  . EYE SURGERY    . HERNIA REPAIR       Medications:  Outpatient Encounter Prescriptions as of 04/07/2017  Medication Sig  .  acetaminophen (TYLENOL) 500 MG tablet Take 1,000 mg by mouth as needed.  Marland Kitchen aspirin 81 MG tablet Take 162 mg by mouth daily.  Marland Kitchen atenolol (TENORMIN) 25 MG tablet Take 12.5 mg by mouth daily.  Marland Kitchen buPROPion (WELLBUTRIN SR) 150 MG 12 hr tablet Take 150 mg by mouth 2 (two) times daily.  . cetirizine (ZYRTEC) 10 MG tablet Take 1 tablet (10 mg total) by mouth daily.  . finasteride (PROSCAR) 5 MG tablet Take 5 mg by mouth at bedtime.  . furosemide (LASIX) 20 MG tablet Take 20 mg by mouth every other day.  . gabapentin (NEURONTIN) 300 MG capsule 600 mg. Take 3 tablets  twice daily  . hydrochlorothiazide (HYDRODIURIL) 25 MG tablet Take 25 mg by mouth daily.  Marland Kitchen HYDROcodone-acetaminophen (NORCO/VICODIN) 5-325 MG tablet Take 1 tablet by mouth every 6 (six) hours as needed for moderate pain.  Marland Kitchen ketoconazole (NIZORAL) 2 % shampoo Apply topically daily.  . Menthol-Methyl Salicylate (THERA-GESIC PLUS) CREA Apply 1 application topically at bedtime. Apply to lower back  . metFORMIN (GLUCOPHAGE-XR) 500 MG 24 hr tablet Take 1 tablet (500 mg total) by mouth daily with breakfast.  . Multiple Vitamins-Minerals (MULTIVITAMINS THER. W/MINERALS) TABS Take 1 tablet by mouth daily.  Marland Kitchen oxyCODONE-acetaminophen (PERCOCET) 10-325 MG tablet Take 1 tablet by mouth every 6 (six) hours as needed for pain.  . potassium chloride SA (K-DUR,KLOR-CON) 20 MEQ tablet Take 20 mEq by mouth as needed.  . traMADol (ULTRAM) 50 MG tablet Take by mouth every 6 (six) hours as needed.  . zolpidem (AMBIEN) 10 MG tablet Take 1 tablet (10 mg total) by mouth at bedtime. For sleep  . nortriptyline (PAMELOR) 10 MG capsule Start nortriptyline 51m at bedtime for 2 week, then increase to 2 tablet at bedtime   No facility-administered encounter medications on file as of 04/07/2017.      Allergies:  Allergies  Allergen Reactions  . Codeine Sulfate Itching and Nausea Only    Family History: Family History  Problem Relation Age of Onset  . Diabetes Mother   . Pneumonia Mother   . Alzheimer's disease Mother   . Other Father 236      Drowned on boating accident  . Colon cancer Neg Hx   . Colon polyps Neg Hx     Social History: Social History  Substance Use Topics  . Smoking status: Former Smoker    Packs/day: 2.00    Years: 33.00    Types: Cigarettes    Start date: 08/30/1968    Quit date: 07/02/2001  . Smokeless tobacco: Never Used     Comment: quit 12 years ago  . Alcohol use Yes     Comment: occasional   Social History   Social History Narrative   [employer]   [employer]-2 years   Married '66     2 sons '68, '72; 3 grandchildren   Sales-petroleum eOccupational hygienist  Retired.    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     Review of Systems:  CONSTITUTIONAL: No fevers, chills, night sweats, or weight loss.   EYES: No visual changes or eye pain +oral pain ENT: No hearing changes.  No history of nose bleeds.   RESPIRATORY: No cough, wheezing and shortness of breath.   CARDIOVASCULAR: Negative for chest pain, and palpitations.   GI: Negative for abdominal discomfort, blood in stools or black stools.  No recent change in bowel habits.   GU:  No history of incontinence.   MUSCLOSKELETAL: +history of  joint pain or swelling.  No myalgias.   SKIN: Negative for lesions, rash, and itching.   HEMATOLOGY/ONCOLOGY: Negative for prolonged bleeding, bruising easily, and swollen nodes.  No history of cancer.   ENDOCRINE: Negative for cold or heat intolerance, polydipsia or goiter.   PSYCH:  +depression or anxiety symptoms.   NEURO: As Above.   Vital Signs:  BP 140/86   Pulse 75   Ht _0  (1.702 m)   Wt 288 lb 8 oz (130.9 kg)   SpO2 97%   BMI 45.19 kg/m    General Medical Exam:   General:  Well appearing, comfortable.   Eyes/ENT: see cranial nerve examination.   Neck: No masses appreciated.  Full range of motion without tenderness.  No carotid bruits. Respiratory:  Clear to auscultation, good air entry bilaterally.   Cardiac:  Regular rate and rhythm, no murmur.   Extremities:  No deformities, edema, or skin discoloration.  Skin:  No rashes or lesions.  Neurological Exam: MENTAL STATUS including orientation to time, place, person, recent and remote memory, attention span and concentration, language, and fund of knowledge is normal.  Speech is not dysarthric.  CRANIAL NERVES: II:  No visual field defects.  Unremarkable fundi.   III-IV-VI: Pupils equal round and reactive to light.  Normal conjugate, extra-ocular eye movements in all directions of gaze.  No nystagmus.  Moderate right ptosis  (old).   V:  Normal facial sensation.  Jaw jerk is absent.   VII:  Right facial asymmetry with weakness involving R frontalis, orbicularis oris, orbicularis oculi, and buccinator muscles 4/5 (old, Bell's palsy).  Left facial muscles are intact. Myerson's sign is present.  VIII:  Normal hearing and vestibular function.   IX-X:  Normal palatal movement.   XI:  Normal shoulder shrug and head rotation.   XII:  Normal tongue strength and range of motion, no deviation or fasciculation.  MOTOR:  No atrophy, fasciculations or abnormal movements.  No pronator drift.  Tone is normal.    Right Upper Extremity:    Left Upper Extremity:    Deltoid  5/5   Deltoid  5/5   Biceps  5/5   Biceps  5/5   Triceps  5/5   Triceps  5/5   Wrist extensors  5/5   Wrist extensors  5/5   Wrist flexors  5/5   Wrist flexors  5/5   Finger extensors  5/5   Finger extensors  5/5   Finger flexors  5/5   Finger flexors  5/5   Dorsal interossei  5/5   Dorsal interossei  5/5   Abductor pollicis  5/5   Abductor pollicis  5/5   Tone (Ashworth scale)  0  Tone (Ashworth scale)  0   Right Lower Extremity:    Left Lower Extremity:    Hip flexors  5/5   Hip flexors  5/5   Hip extensors  5/5   Hip extensors  5/5   Knee flexors  5/5   Knee flexors  5/5   Knee extensors  5/5   Knee extensors  5/5   Dorsiflexors  5/5   Dorsiflexors  5/5   Plantarflexors  5/5   Plantarflexors  5/5   Toe extensors  5/5   Toe extensors  5/5   Toe flexors  5/5   Toe flexors  5/5   Tone (Ashworth scale)  0  Tone (Ashworth scale)  0   MSRs:   Right  Left brachioradialis 2+  brachioradialis 2+  biceps 2+  biceps 2+  triceps 2+  triceps 2+  patellar 2+  patellar 2+  ankle jerk 0  ankle jerk 0  Hoffman no  Hoffman no  plantar response down  plantar response down   SENSORY: Vibration, pinprick, and temperature is diminished distal to the ankles bilaterally.   COORDINATION/GAIT: Normal  finger-to- nose-finger.  Intact rapid alternating movements bilaterally.  Gait is wide-based, due to body habitus, and stable.     IMPRESSION: 1.  Left facial painful paresthesias involving the upper and lower lips and gums.  Pain spares cheek and chin.  There is no associated weakness.  Symptoms are not classic for trigeminal neuralgia given the absence of a lancinating and radiating pain.  His discomfort is very localized and described as hyperesthesia.  Need to evaluate for any structural pathology of the distal trigeminal nerve with MRI face. He also has history of shrapnel to the right eyebrow region and will need plain film xray to be sure he is safe to undergo MRI. He has had prior MRI brain to evaluate for Bell's palsy, which was reportedly normal several years ago.  Check labs to look for treatable causes of nerve pathology.   2.  Diabetic peripheral neuropathy affecting the feet.  He takes gabapentin 600 mg 3 times daily and extra 300 mg as needed.  3.  History of Bell's palsy affecting the right side x 2.  Unknown etiology. I do not have images to review.   PLAN/RECOMMENDATIONS:  1.  Check ESR, CRP, vitamin B12, SPEP with IFE, SSA/B, ACE copper  2.  MRI face wwo contrast  3.  Start nortriptyline 56m at bedtime for 2 week, then increase to 2 tablet at bedtime 4.  Patient informed our office does not prescribe opiates and he will need to request refills through his PCP's office or we can refer him to Pain Management.  Further recommendations will be based on results of studies.  Thank you for allowing me to participate in patient's care.  If I can answer any additional questions, I would be pleased to do so.    Sincerely,    Ces[place]o Vickrey K. PPosey Pronto DO

## 2017-04-07 NOTE — Patient Instructions (Signed)
1.  MRI face  2.  Check labs 3.  Start nortriptyline 10mg  at bedtime for 2 week, then increase to 2 tablet at bedtime  We will call you with the results and let you know the next step

## 2017-04-08 ENCOUNTER — Other Ambulatory Visit: Payer: Self-pay | Admitting: Neurology

## 2017-04-08 DIAGNOSIS — G500 Trigeminal neuralgia: Secondary | ICD-10-CM

## 2017-04-08 DIAGNOSIS — R202 Paresthesia of skin: Secondary | ICD-10-CM

## 2017-04-12 ENCOUNTER — Telehealth: Payer: Self-pay | Admitting: Internal Medicine

## 2017-04-12 LAB — PROTEIN ELECTROPHORESIS, SERUM
ALPHA 1: 0.4 g/dL — AB (ref 0.2–0.3)
ALPHA 2: 0.8 g/dL (ref 0.5–0.9)
Albumin ELP: 3.7 g/dL — ABNORMAL LOW (ref 3.8–4.8)
BETA 2: 0.3 g/dL (ref 0.2–0.5)
Beta Globulin: 0.5 g/dL (ref 0.4–0.6)
GAMMA GLOBULIN: 0.6 g/dL — AB (ref 0.8–1.7)
Total Protein: 6.3 g/dL (ref 6.1–8.1)

## 2017-04-12 LAB — IMMUNOFIXATION ELECTROPHORESIS
IGG (IMMUNOGLOBIN G), SERUM: 549 mg/dL — AB (ref 694–1618)
IgM, Serum: 144 mg/dL (ref 48–271)
Immunoglobulin A: 66 mg/dL — ABNORMAL LOW (ref 81–463)

## 2017-04-12 LAB — SJOGREN'S SYNDROME ANTIBODS(SSA + SSB)
SSA (Ro) (ENA) Antibody, IgG: <1 AI
SSB (LA) (ENA) ANTIBODY, IGG: NEGATIVE AI

## 2017-04-12 LAB — COPPER, SERUM

## 2017-04-12 LAB — ANGIOTENSIN CONVERTING ENZYME: Angiotensin-Converting Enzyme: 56 U/L (ref 9–67)

## 2017-04-12 MED ORDER — HYDROCODONE-ACETAMINOPHEN 5-325 MG PO TABS: 1.0000 | ORAL_TABLET | Freq: Four times a day (QID) | ORAL | 0 refills | Status: DC | PRN

## 2017-04-12 NOTE — Telephone Encounter (Signed)
Done hardcopy to [place]  

## 2017-04-12 NOTE — Telephone Encounter (Signed)
I cannot refill since I have not seen him for this acute issue. You can either forward to Dr. Jenny Reichmann to see if he would refill or schedule visit.

## 2017-04-12 NOTE — Telephone Encounter (Signed)
Pt's wife called stating that the pt would like a refill on oxyCODONE-acetaminophen (PERCOCET) 10-325 MG tablet that was prescribed by Dr Jenny Reichmann. She said that he is still in pain and is not scheduled for an MRI until next week. He is down to his last pill. Please advise.

## 2017-04-12 NOTE — Telephone Encounter (Signed)
Pt's wife called back checking on this. I told her that it was waiting on approval.

## 2017-04-13 ENCOUNTER — Telehealth: Payer: Self-pay

## 2017-04-13 NOTE — Telephone Encounter (Signed)
Patient advised norco rx ready for pick up

## 2017-04-14 ENCOUNTER — Ambulatory Visit: Payer: Medicare HMO | Admitting: Neurology

## 2017-04-15 ENCOUNTER — Telehealth: Payer: Self-pay | Admitting: Neurology

## 2017-04-15 NOTE — Telephone Encounter (Signed)
MRI approved #[id number].

## 2017-04-21 ENCOUNTER — Ambulatory Visit
Admission: RE | Admit: 2017-04-21 | Discharge: 2017-04-21 | Disposition: A | Discharge status: Home / Self Care | Payer: Medicare HMO | Source: Ambulatory Visit | Attending: Neurology | Admitting: Neurology

## 2017-04-21 ENCOUNTER — Other Ambulatory Visit: Payer: Medicare HMO

## 2017-04-21 DIAGNOSIS — Z01818 Encounter for other preprocedural examination: Secondary | ICD-10-CM | POA: Diagnosis not present

## 2017-04-21 DIAGNOSIS — G500 Trigeminal neuralgia: Secondary | ICD-10-CM

## 2017-04-21 DIAGNOSIS — R202 Paresthesia of skin: Secondary | ICD-10-CM

## 2017-04-23 ENCOUNTER — Telehealth: Payer: Self-pay | Admitting: Neurology
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
## 2017-04-27 ENCOUNTER — Encounter: Payer: Self-pay | Admitting: Neurology

## 2017-04-27 ENCOUNTER — Telehealth: Payer: Self-pay | Admitting: Neurology

## 2017-04-27 NOTE — Telephone Encounter (Signed)
Please inform patient that his labs look good, there is mild elevation in one of his proteins which can be very nonspecific.  So far, nothing that is pointing to a reason for him to have the facial numbness..  I will follow this again in 3 months.

## 2017-04-27 NOTE — Telephone Encounter (Signed)
Please advise 

## 2017-04-27 NOTE — Telephone Encounter (Signed)
Pt left a message saying he wanted to know if his test results are available

## 2017-04-28 ENCOUNTER — Telehealth: Payer: Self-pay | Admitting: *Deleted

## 2017-04-28 NOTE — Telephone Encounter (Signed)
-----   Message from Alda Berthold, DO sent at 04/27/2017  2:43 PM EST ----- Please inform patient that due to metal object in the eye, MRI cannot be performed. We can order CT maxillofacial with contrast to see for any structural changes, however we would not be able to get as much information as an MRI would provide. If patient agrees, please order. Thanks.

## 2017-04-28 NOTE — Telephone Encounter (Signed)
Patient notified via My Chart.  Requested for him to let me know if he would like CT ordered.

## 2017-04-28 NOTE — Telephone Encounter (Signed)
Patient notified via My Chart

## 2017-04-29 ENCOUNTER — Other Ambulatory Visit: Payer: Self-pay | Admitting: *Deleted

## 2017-04-29 DIAGNOSIS — Z8669 Personal history of other diseases of the nervous system and sense organs: Secondary | ICD-10-CM

## 2017-04-29 DIAGNOSIS — R202 Paresthesia of skin: Secondary | ICD-10-CM

## 2017-04-29 DIAGNOSIS — G500 Trigeminal neuralgia: Secondary | ICD-10-CM

## 2017-05-03 ENCOUNTER — Other Ambulatory Visit: Payer: Self-pay | Admitting: Internal Medicine

## 2017-05-03 MED ORDER — HYDROCODONE-ACETAMINOPHEN 5-325 MG PO TABS: 1.0000 | ORAL_TABLET | Freq: Four times a day (QID) | ORAL | 0 refills | Status: DC | PRN

## 2017-05-03 NOTE — Telephone Encounter (Signed)
Check [place] [facility] last filled 04/13/2017...[name]

## 2017-05-03 NOTE — Telephone Encounter (Signed)
I have not seen since 2015 and have never prescribed so cannot be refilled.

## 2017-05-03 NOTE — Telephone Encounter (Signed)
Called pt spoke w/wife she states pt see Dr. Jenny Reichmann not [name]. Husband just saw Dr. Jenny Reichmann for his annual in Oct. Inform will forward to Dr. Jenny Reichmann for his response...Tanner [name]

## 2017-05-03 NOTE — Telephone Encounter (Signed)
Done erx 

## 2017-05-11 ENCOUNTER — Ambulatory Visit
Admission: RE | Admit: 2017-05-11 | Discharge: 2017-05-11 | Disposition: A | Discharge status: Home / Self Care | Payer: Medicare HMO | Source: Ambulatory Visit | Attending: Neurology | Admitting: Neurology

## 2017-05-11 DIAGNOSIS — R202 Paresthesia of skin: Secondary | ICD-10-CM

## 2017-05-11 DIAGNOSIS — G500 Trigeminal neuralgia: Secondary | ICD-10-CM

## 2017-05-11 DIAGNOSIS — R51 Headache: Secondary | ICD-10-CM | POA: Diagnosis not present

## 2017-05-11 DIAGNOSIS — Z8669 Personal history of other diseases of the nervous system and sense organs: Secondary | ICD-10-CM

## 2017-05-11 MED ORDER — IOPAMIDOL (ISOVUE-300) INJECTION 61%
75.0000 mL | Freq: Once | INTRAVENOUS | Status: AC | PRN
  Administered 2017-05-11: 75 mL via INTRAVENOUS

## 2017-05-12 ENCOUNTER — Encounter: Payer: Self-pay | Admitting: Neurology

## 2017-05-26 ENCOUNTER — Ambulatory Visit: Payer: Medicare HMO | Admitting: Family

## 2017-05-26 ENCOUNTER — Ambulatory Visit (INDEPENDENT_AMBULATORY_CARE_PROVIDER_SITE_OTHER)
Admission: RE | Admit: 2017-05-26 | Discharge: 2017-05-26 | Disposition: A | Discharge status: Home / Self Care | Payer: Medicare HMO | Source: Ambulatory Visit | Attending: Family | Admitting: Family

## 2017-05-26 ENCOUNTER — Ambulatory Visit: Payer: Self-pay | Admitting: *Deleted

## 2017-05-26 ENCOUNTER — Encounter: Payer: Self-pay | Admitting: Family

## 2017-05-26 VITALS — BP 138/70 | HR 83 | Temp 98.6°F | Ht 67.0 in | Wt 277.0 lb

## 2017-05-26 DIAGNOSIS — R059 Cough, unspecified: Secondary | ICD-10-CM

## 2017-05-26 DIAGNOSIS — R05 Cough: Secondary | ICD-10-CM | POA: Diagnosis not present

## 2017-05-26 MED ORDER — DOXYCYCLINE HYCLATE 100 MG PO TABS: 100.0000 mg | ORAL_TABLET | Freq: Two times a day (BID) | ORAL | 0 refills | Status: DC

## 2017-05-26 NOTE — Telephone Encounter (Signed)
   Reason for Disposition . SEVERE coughing spells (e.g., whooping sound after coughing, vomiting after coughing)  Answer Assessment - Initial Assessment Questions 1. ONSET: "When did the cough begin?"      1 week 2. SEVERITY: "How bad is the cough today?"      Patient had deep cough- is having a hard time getting congestion up 3. RESPIRATORY DISTRESS: "Describe your breathing."      no 4. FEVER: "Do you have a fever?" If so, ask: "What is your temperature, how was it measured, and when did it start?"     no 5. SPUTUM: "Describe the color of your sputum" (clear, white, yellow, green)     yellow 6. HEMOPTYSIS: "Are you coughing up any blood?" If so ask: "How much?" (flecks, streaks, tablespoons, etc.)     There has been some- not in several days streaks 7. CARDIAC HISTORY: "Do you have any history of heart disease?" (e.g., heart attack, congestive heart failure)      no 8. LUNG HISTORY: "Do you have any history of lung disease?"  (e.g., pulmonary embolus, asthma, emphysema)     no 9. PE RISK FACTORS: "Do you have a history of blood clots?" (or: recent major surgery, recent prolonged travel, bedridden )     History of blood clots- several years ago 10. OTHER SYMPTOMS: "Do you have any other symptoms?" (e.g., runny nose, wheezing, chest pain)       Runny nose, head feels like sinus headache, trouble walking- using cane- weak, confusion last week 11. PREGNANCY: "Is there any chance you are pregnant?" "When was your last menstrual period?"       n/a 12. TRAVEL: "Have you traveled out of the country in the last month?" (e.g., travel history, exposures)       n/a    Patient's wife is calling- she is very concerned about her husband. He has had a cough for a week now and he is getting weaker and not improving. Acute appointment given.  Protocols used: COUGH - ACUTE PRODUCTIVE-A-AH
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
## 2017-05-27 NOTE — Progress Notes (Signed)
Spoke with patient's wife; explained that CXR does show pneumonia as suspected; keep planned follow-up for tomorrow, 12/14; strict ER precautions discussed.

## 2017-05-28 ENCOUNTER — Other Ambulatory Visit: Payer: Self-pay

## 2017-05-28 ENCOUNTER — Ambulatory Visit: Payer: Medicare HMO | Admitting: Family

## 2017-05-28 ENCOUNTER — Inpatient Hospital Stay (HOSPITAL_COMMUNITY)
Admission: EM | Admit: 2017-05-28 | Discharge: 2017-05-31 | DRG: 193 | Disposition: A | Discharge status: Home / Self Care | Payer: Medicare HMO | Attending: Internal Medicine | Admitting: Internal Medicine

## 2017-05-28 ENCOUNTER — Encounter (HOSPITAL_COMMUNITY): Payer: Self-pay

## 2017-05-28 ENCOUNTER — Encounter: Payer: Self-pay | Admitting: Family

## 2017-05-28 ENCOUNTER — Emergency Department (HOSPITAL_COMMUNITY): Payer: Medicare HMO

## 2017-05-28 VITALS — BP 122/66 | HR 73 | Temp 97.4°F | Ht 67.0 in | Wt 278.0 lb

## 2017-05-28 DIAGNOSIS — F32A Depression, unspecified: Secondary | ICD-10-CM | POA: Diagnosis present

## 2017-05-28 DIAGNOSIS — E662 Morbid (severe) obesity with alveolar hypoventilation: Secondary | ICD-10-CM | POA: Diagnosis present

## 2017-05-28 DIAGNOSIS — J189 Pneumonia, unspecified organism: Secondary | ICD-10-CM | POA: Diagnosis not present

## 2017-05-28 DIAGNOSIS — Z79899 Other long term (current) drug therapy: Secondary | ICD-10-CM | POA: Diagnosis not present

## 2017-05-28 DIAGNOSIS — Z86718 Personal history of other venous thrombosis and embolism: Secondary | ICD-10-CM | POA: Diagnosis not present

## 2017-05-28 DIAGNOSIS — A0471 Enterocolitis due to Clostridium difficile, recurrent: Secondary | ICD-10-CM | POA: Diagnosis not present

## 2017-05-28 DIAGNOSIS — I5032 Chronic diastolic (congestive) heart failure: Secondary | ICD-10-CM | POA: Diagnosis not present

## 2017-05-28 DIAGNOSIS — Z66 Do not resuscitate: Secondary | ICD-10-CM | POA: Diagnosis present

## 2017-05-28 DIAGNOSIS — Z87891 Personal history of nicotine dependence: Secondary | ICD-10-CM | POA: Diagnosis not present

## 2017-05-28 DIAGNOSIS — Z86711 Personal history of pulmonary embolism: Secondary | ICD-10-CM | POA: Diagnosis not present

## 2017-05-28 DIAGNOSIS — G8929 Other chronic pain: Secondary | ICD-10-CM | POA: Diagnosis present

## 2017-05-28 DIAGNOSIS — G629 Polyneuropathy, unspecified: Secondary | ICD-10-CM

## 2017-05-28 DIAGNOSIS — Z7951 Long term (current) use of inhaled steroids: Secondary | ICD-10-CM | POA: Diagnosis not present

## 2017-05-28 DIAGNOSIS — J181 Lobar pneumonia, unspecified organism: Secondary | ICD-10-CM

## 2017-05-28 DIAGNOSIS — E1122 Type 2 diabetes mellitus with diabetic chronic kidney disease: Secondary | ICD-10-CM | POA: Diagnosis present

## 2017-05-28 DIAGNOSIS — I130 Hypertensive heart and chronic kidney disease with heart failure and stage 1 through stage 4 chronic kidney disease, or unspecified chronic kidney disease: Secondary | ICD-10-CM | POA: Diagnosis not present

## 2017-05-28 DIAGNOSIS — Z7982 Long term (current) use of aspirin: Secondary | ICD-10-CM | POA: Diagnosis not present

## 2017-05-28 DIAGNOSIS — Z833 Family history of diabetes mellitus: Secondary | ICD-10-CM

## 2017-05-28 DIAGNOSIS — Z7901 Long term (current) use of anticoagulants: Secondary | ICD-10-CM

## 2017-05-28 DIAGNOSIS — N182 Chronic kidney disease, stage 2 (mild): Secondary | ICD-10-CM | POA: Diagnosis present

## 2017-05-28 DIAGNOSIS — E1142 Type 2 diabetes mellitus with diabetic polyneuropathy: Secondary | ICD-10-CM | POA: Diagnosis present

## 2017-05-28 DIAGNOSIS — F329 Major depressive disorder, single episode, unspecified: Secondary | ICD-10-CM | POA: Diagnosis present

## 2017-05-28 DIAGNOSIS — J9601 Acute respiratory failure with hypoxia: Secondary | ICD-10-CM | POA: Diagnosis present

## 2017-05-28 DIAGNOSIS — Z6841 Body Mass Index (BMI) 40.0 and over, adult: Secondary | ICD-10-CM | POA: Diagnosis not present

## 2017-05-28 DIAGNOSIS — R609 Edema, unspecified: Secondary | ICD-10-CM | POA: Diagnosis not present

## 2017-05-28 DIAGNOSIS — N400 Enlarged prostate without lower urinary tract symptoms: Secondary | ICD-10-CM | POA: Diagnosis present

## 2017-05-28 DIAGNOSIS — Z7984 Long term (current) use of oral hypoglycemic drugs: Secondary | ICD-10-CM

## 2017-05-28 DIAGNOSIS — E119 Type 2 diabetes mellitus without complications: Secondary | ICD-10-CM

## 2017-05-28 DIAGNOSIS — I2699 Other pulmonary embolism without acute cor pulmonale: Secondary | ICD-10-CM | POA: Diagnosis present

## 2017-05-28 DIAGNOSIS — I82402 Acute embolism and thrombosis of unspecified deep veins of left lower extremity: Secondary | ICD-10-CM | POA: Diagnosis present

## 2017-05-28 DIAGNOSIS — R0602 Shortness of breath: Secondary | ICD-10-CM | POA: Diagnosis not present

## 2017-05-28 LAB — CBC WITH DIFFERENTIAL/PLATELET
BASOS ABS: 0 10*3/uL (ref 0.0–0.1)
Basophils Relative: 0 %
EOS PCT: 3 %
Eosinophils Absolute: 0.4 10*3/uL (ref 0.0–0.7)
HEMATOCRIT: 39.8 % (ref 39.0–52.0)
Hemoglobin: 12.9 g/dL — ABNORMAL LOW (ref 13.0–17.0)
LYMPHS PCT: 17 %
Lymphs Abs: 2.3 10*3/uL (ref 0.7–4.0)
MCH: 28.2 pg (ref 26.0–34.0)
MCHC: 32.4 g/dL (ref 30.0–36.0)
MCV: 87.1 fL (ref 78.0–100.0)
MONO ABS: 1.1 10*3/uL — AB (ref 0.1–1.0)
MONOS PCT: 8 %
NEUTROS ABS: 9.8 10*3/uL — AB (ref 1.7–7.7)
Neutrophils Relative %: 72 %
PLATELETS: 277 10*3/uL (ref 150–400)
RBC: 4.57 MIL/uL (ref 4.22–5.81)
RDW: 15.1 % (ref 11.5–15.5)
WBC: 13.5 10*3/uL — ABNORMAL HIGH (ref 4.0–10.5)

## 2017-05-28 LAB — INFLUENZA PANEL BY PCR (TYPE A & B)
Influenza A By PCR: NEGATIVE
Influenza B By PCR: NEGATIVE

## 2017-05-28 LAB — COMPREHENSIVE METABOLIC PANEL WITH GFR
ALT: 22 U/L (ref 17–63)
AST: 23 U/L (ref 15–41)
Albumin: 2.8 g/dL — ABNORMAL LOW (ref 3.5–5.0)
Alkaline Phosphatase: 74 U/L (ref 38–126)
Anion gap: 12 (ref 5–15)
BUN: 22 mg/dL — ABNORMAL HIGH (ref 6–20)
CO2: 28 mmol/L (ref 22–32)
Calcium: 8.8 mg/dL — ABNORMAL LOW (ref 8.9–10.3)
Chloride: 96 mmol/L — ABNORMAL LOW (ref 101–111)
Creatinine, Ser: 1.21 mg/dL (ref 0.61–1.24)
GFR calc Af Amer: >60 mL/min (ref >60)
GFR calc non Af Amer: 58 mL/min — ABNORMAL LOW (ref >60)
Glucose, Bld: 86 mg/dL (ref 65–99)
Potassium: 3.7 mmol/L (ref 3.5–5.1)
Sodium: 136 mmol/L (ref 135–145)
Total Bilirubin: 0.7 mg/dL (ref 0.3–1.2)
Total Protein: 6.4 g/dL — ABNORMAL LOW (ref 6.5–8.1)

## 2017-05-28 LAB — GLUCOSE, CAPILLARY: Glucose-Capillary: 114 mg/dL — ABNORMAL HIGH (ref 65–99)

## 2017-05-28 LAB — I-STAT CG4 LACTIC ACID, ED
LACTIC ACID, VENOUS: 0.8 mmol/L (ref 0.5–1.9)
Lactic Acid, Venous: 1.08 mmol/L (ref 0.5–1.9)

## 2017-05-28 LAB — BRAIN NATRIURETIC PEPTIDE: B Natriuretic Peptide: 33.8 pg/mL (ref 0.0–100.0)

## 2017-05-28 MED ORDER — VANCOMYCIN HCL 10 G IV SOLR: 1250.0000 mg | INTRAVENOUS | Status: DC

## 2017-05-28 MED ORDER — GABAPENTIN 300 MG PO CAPS
300.0000 mg | ORAL_CAPSULE | Freq: Every day | ORAL | Status: DC
  Administered 2017-05-28 – 2017-05-30 (×3): 300 mg via ORAL
  Filled 2017-05-28 (×3): qty 1

## 2017-05-28 MED ORDER — ZOLPIDEM TARTRATE 10 MG PO TABS
5.0000 mg | ORAL_TABLET | Freq: Every day | ORAL | Status: DC
  Administered 2017-05-28 – 2017-05-30 (×3): 5 mg via ORAL
  Filled 2017-05-28 (×3): qty 1

## 2017-05-28 MED ORDER — FINASTERIDE 5 MG PO TABS
5.0000 mg | ORAL_TABLET | Freq: Every day | ORAL | Status: DC
  Administered 2017-05-28 – 2017-05-30 (×3): 5 mg via ORAL
  Filled 2017-05-28 (×3): qty 1

## 2017-05-28 MED ORDER — ADULT MULTIVITAMIN W/MINERALS CH
1.0000 | ORAL_TABLET | Freq: Every day | ORAL | Status: DC
  Administered 2017-05-29 – 2017-05-31 (×3): 1 via ORAL
  Filled 2017-05-28 (×3): qty 1

## 2017-05-28 MED ORDER — DEXTROSE 5 % IV SOLN
2.0000 g | INTRAVENOUS | Status: DC
  Administered 2017-05-28 – 2017-05-30 (×3): 2 g via INTRAVENOUS
  Filled 2017-05-28 (×4): qty 2

## 2017-05-28 MED ORDER — HYDROCHLOROTHIAZIDE 25 MG PO TABS
25.0000 mg | ORAL_TABLET | Freq: Every day | ORAL | Status: DC
  Administered 2017-05-29 – 2017-05-30 (×2): 25 mg via ORAL
  Filled 2017-05-28 (×2): qty 1

## 2017-05-28 MED ORDER — INSULIN ASPART 100 UNIT/ML ~~LOC~~ SOLN
0.0000 [IU] | Freq: Three times a day (TID) | SUBCUTANEOUS | Status: DC
  Administered 2017-05-29 – 2017-05-31 (×4): 2 [IU] via SUBCUTANEOUS

## 2017-05-28 MED ORDER — ASPIRIN EC 81 MG PO TBEC
162.0000 mg | DELAYED_RELEASE_TABLET | Freq: Every day | ORAL | Status: DC
  Administered 2017-05-28 – 2017-05-31 (×4): 162 mg via ORAL
  Filled 2017-05-28 (×3): qty 2

## 2017-05-28 MED ORDER — GABAPENTIN 300 MG PO CAPS
600.0000 mg | ORAL_CAPSULE | Freq: Two times a day (BID) | ORAL | Status: DC
  Administered 2017-05-29: 600 mg via ORAL
  Filled 2017-05-28: qty 2

## 2017-05-28 MED ORDER — LORATADINE 10 MG PO TABS
10.0000 mg | ORAL_TABLET | Freq: Every day | ORAL | Status: DC
  Administered 2017-05-29 – 2017-05-31 (×3): 10 mg via ORAL
  Filled 2017-05-28 (×3): qty 1

## 2017-05-28 MED ORDER — DOXYCYCLINE HYCLATE 100 MG IV SOLR
100.0000 mg | Freq: Once | INTRAVENOUS | Status: AC
  Administered 2017-05-28: 100 mg via INTRAVENOUS
  Filled 2017-05-28: qty 100

## 2017-05-28 MED ORDER — VANCOMYCIN HCL 10 G IV SOLR
2000.0000 mg | Freq: Once | INTRAVENOUS | Status: AC
  Administered 2017-05-28: 2000 mg via INTRAVENOUS
  Filled 2017-05-28: qty 2000

## 2017-05-28 MED ORDER — ENOXAPARIN SODIUM 60 MG/0.6ML ~~LOC~~ SOLN
60.0000 mg | SUBCUTANEOUS | Status: DC
  Administered 2017-05-28 – 2017-05-30 (×3): 60 mg via SUBCUTANEOUS
  Filled 2017-05-28 (×3): qty 0.6

## 2017-05-28 MED ORDER — ALBUTEROL SULFATE (2.5 MG/3ML) 0.083% IN NEBU
2.5000 mg | INHALATION_SOLUTION | Freq: Once | RESPIRATORY_TRACT | Status: DC
  Administered 2017-05-28: 2.5 mg via RESPIRATORY_TRACT

## 2017-05-28 MED ORDER — FUROSEMIDE 20 MG PO TABS
20.0000 mg | ORAL_TABLET | ORAL | Status: DC
  Administered 2017-05-29: 20 mg via ORAL
  Filled 2017-05-28: qty 1

## 2017-05-28 MED ORDER — BUPROPION HCL ER (SR) 150 MG PO TB12
150.0000 mg | ORAL_TABLET | Freq: Two times a day (BID) | ORAL | Status: DC
  Administered 2017-05-28 – 2017-05-31 (×6): 150 mg via ORAL
  Filled 2017-05-28 (×6): qty 1

## 2017-05-28 MED ORDER — VANCOMYCIN 50 MG/ML ORAL SOLUTION
125.0000 mg | Freq: Four times a day (QID) | ORAL | Status: DC
  Administered 2017-05-28 – 2017-05-31 (×12): 125 mg via ORAL
  Filled 2017-05-28 (×13): qty 2.5

## 2017-05-28 MED ORDER — ATENOLOL 25 MG PO TABS
12.5000 mg | ORAL_TABLET | Freq: Every day | ORAL | Status: DC
  Administered 2017-05-29 – 2017-05-31 (×3): 12.5 mg via ORAL
  Filled 2017-05-28 (×3): qty 1

## 2017-05-28 MED ORDER — AZITHROMYCIN 250 MG PO TABS
500.0000 mg | ORAL_TABLET | Freq: Every day | ORAL | Status: DC
  Administered 2017-05-28 – 2017-05-30 (×3): 500 mg via ORAL
  Filled 2017-05-28 (×3): qty 2

## 2017-05-28 NOTE — H&P (Signed)
History and Physical    Tanner [name] ZOX:[id number] DOB: 09/11/1945 DOA: 05/28/2017  PCP: Hoyt Koch, MD  Patient coming from: home c  Chief Complaint: cough  HPI: Tanner [name] is a 71 y.o. male with medical history significant of dvt/pe, htn, dm, recurrent c-diff, ckd2, bph, obesity, chronic pain, who presents referred from pcp for hypoxia and pneumonia.  Presented to pcp 2 days ago complaining of about 2 weeks worsening non-productive cough and general malaise/weakness. Denies fevers. Not a productive cough. No new leg swelling, no hemoptysis. No recent abx other than what was prescribed at pcp's 2 days ago. Tolerating PO. No chest pain or significant dyspnea. Started on doxycycline by pcp 2 days ago and interms of energy and fatigue was improving, but still with hacking cough. At pcp's today was hypoxic to upper 80s per patient report. Denies significant dyspnea. No history of hypoxia.   ED Course: oxygen, labs, doxycycline  Review of Systems: As per HPI otherwise 10 point review of systems negative.    Past Medical History:  Diagnosis Date  . Acute kidney failure ([place])   . Arthritis   . Back pain   . BPH (benign prostatic hypertrophy)   . Clostridium difficile infection   . Clotting disorder ([place])   . Depression   . Diabetes ([place]) 12/11/2016  . DVT (deep venous thrombosis) ([place])   . Hypertension   . Low back pain potentially associated with spinal stenosis   . Neuromuscular disorder ([place])   . Neuropathy of lower extremity    bilateral  . OSA (obstructive sleep apnea)   . PE (pulmonary embolism)   . Ventral hernia     Past Surgical History:  Procedure Laterality Date  . EYE SURGERY    . HERNIA REPAIR       reports that he quit smoking about 15 years ago. His smoking use included cigarettes. He started smoking about 48 years ago. He has a 66.00 pack-year smoking history. he has never used smokeless tobacco. He reports that he drinks alcohol. He reports  that he does not use drugs.  Allergies  Allergen Reactions  . Codeine Sulfate Itching and Nausea Only    Family History  Problem Relation Age of Onset  . Diabetes Mother   . Pneumonia Mother   . Alzheimer's disease Mother   . Other Father 51       Drowned on boating accident  . Colon cancer Neg Hx   . Colon polyps Neg Hx     Prior to Admission medications   Medication Sig Start Date End Date Taking? Authorizing Provider  acetaminophen (TYLENOL) 500 MG tablet Take 1,000 mg by mouth as needed. 02/21/13  Yes Norins, Heinz Knuckles, MD  aspirin 81 MG tablet Take 162 mg by mouth daily.   Yes [provider]  atenolol (TENORMIN) 25 MG tablet Take 12.5 mg by mouth daily.   Yes [provider]  budesonide-formoterol (SYMBICORT) 160-4.5 MCG/ACT inhaler Inhale 2 puffs into the lungs 2 (two) times daily. 2 PUFFS IN THE MORNING AND 2 PUFFS IN THE EVENING   Yes [provider]  buPROPion (WELLBUTRIN SR) 150 MG 12 hr tablet Take 150 mg by mouth 2 (two) times daily.   Yes [provider]  cetirizine (ZYRTEC) 10 MG tablet Take 1 tablet (10 mg total) by mouth daily. 12/11/16 12/11/17 Yes Biagio Borg, MD  doxycycline (VIBRA-TABS) 100 MG tablet Take 1 tablet (100 mg total) by mouth 2 (two) times daily. 05/26/17  Yes Sherlene Shams, FNP  finasteride (PROSCAR) 5 MG tablet Take 5 mg by mouth at bedtime.   Yes [provider]  furosemide (LASIX) 20 MG tablet Take 20 mg by mouth every other day.   Yes [provider]  gabapentin (NEURONTIN) 300 MG capsule 600 mg. Take 3 tablets twice daily AND 300 MG AT BEDTIME   Yes [provider]  hydrochlorothiazide (HYDRODIURIL) 25 MG tablet Take 25 mg by mouth daily.   Yes [provider]  HYDROcodone-acetaminophen (NORCO/VICODIN) 5-325 MG tablet Take 1 tablet every 6 (six) hours as needed by mouth for moderate pain. 05/03/17  Yes Biagio Borg, MD  ketoconazole (NIZORAL) 2 % shampoo Apply topically  daily.   Yes [provider]  Menthol-Methyl Salicylate (THERA-GESIC PLUS) CREA Apply 1 application topically at bedtime. Apply to lower back   Yes [provider]  metFORMIN (GLUCOPHAGE-XR) 500 MG 24 hr tablet Take 1 tablet (500 mg total) by mouth daily with breakfast. 12/11/16  Yes Biagio Borg, MD  Multiple Vitamins-Minerals (MULTIVITAMINS THER. W/MINERALS) TABS Take 1 tablet by mouth daily.   Yes [provider]  nortriptyline (PAMELOR) 10 MG capsule Start nortriptyline 10mg  at bedtime for 2 week, then increase to 2 tablet at bedtime 04/07/17  Yes Patel, Donika K, DO  oxyCODONE-acetaminophen (PERCOCET) 10-325 MG tablet Take 1 tablet by mouth every 6 (six) hours as needed for pain. 03/25/17  Yes Biagio Borg, MD  potassium chloride SA (K-DUR,KLOR-CON) 20 MEQ tablet Take 20 mEq by mouth as needed. 12/21/13  Yes Theodis Blaze, MD  traMADol Veatrice Bourbon) 50 MG tablet Take by mouth every 6 (six) hours as needed.   Yes [provider]  zolpidem (AMBIEN) 10 MG tablet Take 1 tablet (10 mg total) by mouth at bedtime. For sleep 12/21/13  Yes Theodis Blaze, MD    Physical Exam: Vitals:   05/28/17 1900 05/28/17 1930 05/28/17 2000 05/28/17 2030  BP: 115/73 118/87 115/64 129/70  Pulse: 72 71 66 76  Resp: 17 14 10 16   Temp:      TempSrc:      SpO2: 92% 93% 93% 93%    Constitutional: NAD, calm, comfortable Vitals:   05/28/17 1900 05/28/17 1930 05/28/17 2000 05/28/17 2030  BP: 115/73 118/87 115/64 129/70  Pulse: 72 71 66 76  Resp: 17 14 10 16   Temp:      TempSrc:      SpO2: 92% 93% 93% 93%   Eyes: PERRL, lids and conjunctivae normal ENMT: Mucous membranes are moist. Posterior pharynx clear of any exudate or lesions.Normal dentition.  Neck: normal, supple, Respiratory: clear to auscultation bilaterally, no wheezing, no crackles. Normal respiratory effort. No accessory muscle use.  Cardiovascular: Regular rate and rhythm, no murmurs / rubs / gallops. Moderate LE  pitting edema b/l Abdomen: no tenderness, no masses palpated. No hepatosplenomegaly. Bowel sounds positive.  Musculoskeletal: no clubbing / cyanosis. No joint deformity upper and lower extremities. Good ROM, no contractures. Normal muscle tone.  Skin: no rashes, lesions, ulcers.  Neurologic: moving all 4 extremities, alert Psychiatric: Normal judgment and insight. Alert. Normal mood.   Ecg: stable rbb, qtc 470  Labs on Admission: I have personally reviewed following labs and imaging studies  CBC: Recent Labs  Lab 05/28/17 1808  WBC 13.5*  NEUTROABS 9.8*  HGB 12.9*  HCT 39.8  MCV 87.1  PLT 161   Basic Metabolic Panel: Recent Labs  Lab 05/28/17 1808  NA 136  K 3.7  CL 96*  CO2 28  GLUCOSE 86  BUN 22*  CREATININE 1.21  CALCIUM 8.8*   GFR: Estimated Creatinine Clearance: 70.3 mL/min (by C-G formula based on SCr of 1.21 mg/dL). Liver Function Tests: Recent Labs  Lab 05/28/17 1808  AST 23  ALT 22  ALKPHOS 74  BILITOT 0.7  PROT 6.4*  ALBUMIN 2.8*   No results for input(s): LIPASE, AMYLASE in the last 168 hours. No results for input(s): AMMONIA in the last 168 hours. Coagulation Profile: No results for input(s): INR, PROTIME in the last 168 hours. Cardiac Enzymes: No results for input(s): CKTOTAL, CKMB, CKMBINDEX, TROPONINI in the last 168 hours. BNP (last 3 results) No results for input(s): PROBNP in the last 8760 hours. HbA1C: No results for input(s): HGBA1C in the last 72 hours. CBG: No results for input(s): GLUCAP in the last 168 hours. Lipid Profile: No results for input(s): CHOL, HDL, LDLCALC, TRIG, CHOLHDL, LDLDIRECT in the last 72 hours. Thyroid Function Tests: No results for input(s): TSH, T4TOTAL, FREET4, T3FREE, THYROIDAB in the last 72 hours. Anemia Panel: No results for input(s): VITAMINB12, FOLATE, FERRITIN, TIBC, IRON, RETICCTPCT in the last 72 hours. Urine analysis:    Component Value Date/Time   COLORURINE LT YELLOW 09/13/2007 1028    APPEARANCEUR Clear 09/13/2007 1028   LABSPEC 1.020 09/13/2007 1028   PHURINE 6.5 09/13/2007 1028   GLUCOSEU NEGATIVE 09/13/2007 1028   BILIRUBINUR negative 04/19/2015 1417   BILIRUBINUR neg 02/12/2013 1449   KETONESUR negative 04/19/2015 1417   KETONESUR NEGATIVE 09/13/2007 1028   PROTEINUR trace (A) 04/19/2015 1417   PROTEINUR trace 02/12/2013 1449   UROBILINOGEN 0.2 04/19/2015 1417   UROBILINOGEN 0.2 mg/dL 09/13/2007 1028   NITRITE Negative 04/19/2015 1417   NITRITE neg 02/12/2013 1449   NITRITE Negative 09/13/2007 1028   LEUKOCYTESUR Trace (A) 04/19/2015 1417    Radiological Exams on Admission: Dg Chest 2 View  Result Date: 05/28/2017 CLINICAL DATA:  Shortness of breath. EXAM: CHEST  2 VIEW COMPARISON:  05/26/2017 FINDINGS: Mild cardiac enlargement. No pleural effusion. Airspace consolidation within the posterior right lower lobe identified. The appearance is unchanged from the previous exam left lung is relatively clear. IMPRESSION: 1. Persistent posterior right lower lobe airspace opacity which may represent pneumonia. Followup PA and lateral chest X-ray is recommended in 3-4 weeks following trial of antibiotic therapy to ensure resolution and exclude underlying malignancy. Electronically Signed   By: Kerby Moors M.D.   On: 05/28/2017 16:12     Assessment/Plan Active Problems:   Major depressive disorder   BPH (benign prostatic hyperplasia)   History of pulmonary embolus (PE)   Diabetes (HCC)   Peripheral neuropathy   Community acquired pneumonia   Recurrent Clostridium difficile diarrhea   # Community acquired pneumonia - improving on doxycycline but with new hypoxia, satting normally on [address]. CXR shows RLL infiltrate. Overall well appearing, no tachypnea. Mild leukocytosis. Afebrile. - think stable for floor - given recent abx do think mrsa risk elevated, will treat with vanc/ceftriaxone/azithromycin - f/u legionella urine antigen - not producing sputum so not  ordering sputum gram stain/culture - f/u flu panel - [place] o2, wean as able - incentive spirometry - given non-severe status holding on glucocorticoids - f/u blood cultures  # Recurrent c-diff colitis - at April GI visit plan was fecal transplant if another episode, so I do deem this patient at very high risk for recurrence - will start oral vancomycin as preventive measure  # Diabetes mellitus - initial glucose  wnl - hold home metformin, start SSI  # hx dvt/pe - says told by hematology does not need anticoagulation - continue home aspirin, dvt ppx as below  # Chronic pain # bph # htn # depression - all stable - continue home meds    DVT prophylaxis: lovenox, scds  Code Status: dnr Family Communication: son Tanner [name] [phone] or wife Tanner [name] [phone] Disposition Plan: home Consults called: none Admission status: med/surg   Desma Maxim MD [facility] Pager [phone]  If 7PM-7AM, please contact night-coverage [email or web address] Password [email or web address]  05/28/2017, 9:06 PM

## 2017-05-28 NOTE — ED Triage Notes (Signed)
Patient states that he has had a productive cough with green/gray sputum x 3 weeks. Patient went for a follow up with his PCP and was told he had pneumonia. Sats at PCP office was 84%. CXR was done 2 days ago,

## 2017-05-28 NOTE — Progress Notes (Signed)
Tanner [name] is a 71 y.o. male with the following history as recorded in EpicCare:  Patient Active Problem List   Diagnosis Date Noted  . Peripheral neuropathy 03/17/2017  . Facial numbness 03/17/2017  . Mass of left side of neck 01/22/2017  . Acute parotitis 12/23/2016  . Diabetes ([place]) 12/11/2016  . Allergic rhinitis 12/11/2016  . Degenerative arthritis of left knee 01/30/2016  . Morbid obesity ([place]) 01/30/2016  . Lip abscess 03/15/2015  . Routine general medical examination at a health care facility 04/20/2014  . C. difficile colitis 12/19/2013  . History of pulmonary embolus (PE) 12/19/2013  . Subacromial bursitis 05/10/2013  . INSOMNIA UNSPECIFIED 09/16/2007  . Anxiety state 03/29/2007  . DEPRESSION 03/29/2007  . OBSTRUCTIVE SLEEP APNEA 03/29/2007  . GLAUCOMA NOS 03/29/2007  . BPH (benign prostatic hyperplasia) 03/29/2007  . OSTEOARTHRITIS, KNEE 03/29/2007  . ELEVATED BP W/O HYPERTENSION 03/29/2007  . BELL'S PALSY, HX OF 03/29/2007    Current Outpatient Medications  Medication Sig Dispense Refill  . acetaminophen (TYLENOL) 500 MG tablet Take 1,000 mg by mouth as needed.    Marland Kitchen aspirin 81 MG tablet Take 162 mg by mouth daily.    Marland Kitchen atenolol (TENORMIN) 25 MG tablet Take 12.5 mg by mouth daily.    Marland Kitchen buPROPion (WELLBUTRIN SR) 150 MG 12 hr tablet Take 150 mg by mouth 2 (two) times daily.    . cetirizine (ZYRTEC) 10 MG tablet Take 1 tablet (10 mg total) by mouth daily. 30 tablet 11  . doxycycline (VIBRA-TABS) 100 MG tablet Take 1 tablet (100 mg total) by mouth 2 (two) times daily. 20 tablet 0  . finasteride (PROSCAR) 5 MG tablet Take 5 mg by mouth at bedtime.    . furosemide (LASIX) 20 MG tablet Take 20 mg by mouth every other day.    . gabapentin (NEURONTIN) 300 MG capsule 600 mg. Take 3 tablets twice daily    . hydrochlorothiazide (HYDRODIURIL) 25 MG tablet Take 25 mg by mouth daily.    Marland Kitchen HYDROcodone-acetaminophen (NORCO/VICODIN) 5-325 MG tablet Take 1 tablet every 6 (six)  hours as needed by mouth for moderate pain. 40 tablet 0  . ketoconazole (NIZORAL) 2 % shampoo Apply topically daily.    . Menthol-Methyl Salicylate (THERA-GESIC PLUS) CREA Apply 1 application topically at bedtime. Apply to lower back    . metFORMIN (GLUCOPHAGE-XR) 500 MG 24 hr tablet Take 1 tablet (500 mg total) by mouth daily with breakfast. 90 tablet 3  . Multiple Vitamins-Minerals (MULTIVITAMINS THER. W/MINERALS) TABS Take 1 tablet by mouth daily.    . nortriptyline (PAMELOR) 10 MG capsule Start nortriptyline 10mg  at bedtime for 2 week, then increase to 2 tablet at bedtime 60 capsule 5  . oxyCODONE-acetaminophen (PERCOCET) 10-325 MG tablet Take 1 tablet by mouth every 6 (six) hours as needed for pain. 30 tablet 0  . potassium chloride SA (K-DUR,KLOR-CON) 20 MEQ tablet Take 20 mEq by mouth as needed.    . traMADol (ULTRAM) 50 MG tablet Take by mouth every 6 (six) hours as needed.    . zolpidem (AMBIEN) 10 MG tablet Take 1 tablet (10 mg total) by mouth at bedtime. For sleep 30 tablet 0   No current facility-administered medications for this visit.     Allergies: Codeine sulfate  Past Medical History:  Diagnosis Date  . Acute kidney failure ([place])   . Arthritis   . Back pain   . BPH (benign prostatic hypertrophy)   . Clostridium difficile infection   . Clotting disorder (  [place])   . Depression   . Diabetes ([place]) 12/11/2016  . DVT (deep venous thrombosis) ([place])   . Hypertension   . Low back pain potentially associated with spinal stenosis   . Neuromuscular disorder ([place])   . Neuropathy of lower extremity    bilateral  . OSA (obstructive sleep apnea)   . PE (pulmonary embolism)   . Ventral hernia     Past Surgical History:  Procedure Laterality Date  . EYE SURGERY    . HERNIA REPAIR      Family History  Problem Relation Age of Onset  . Diabetes Mother   . Pneumonia Mother   . Alzheimer's disease Mother   . Other Father 68       Drowned on boating accident  . Colon cancer Neg Hx    . Colon polyps Neg Hx     Social History   Tobacco Use  . Smoking status: Former Smoker    Packs/day: 2.00    Years: 33.00    Pack years: 66.00    Types: Cigarettes    Start date: 08/30/1968    Last attempt to quit: 07/02/2001    Years since quitting: 15.9  . Smokeless tobacco: Never Used  . Tobacco comment: quit 12 years ago  Substance Use Topics  . Alcohol use: Yes    Comment: occasional    Subjective:  2 day follow-up on RLL pneumonia; is accompanied by his son today; notes that he does actually feel somewhat better; has felt like cleaning the house some in the past day and getting up and moving around "a little more." Denies any fever or chest pain; does feel that Symbicort is beneficial; still having "bad coughing fits" especially at night.   Objective:  Vitals:   05/28/17 1309  BP: 122/66  Pulse: 73  Temp: (!) 97.4 F (36.3 C)  TempSrc: Oral  SpO2: 90%  Weight: 278 lb (126.1 kg)  Height: 5\' 7"  (1.702 m)    General: Well developed, well nourished, in no acute distress  Skin : Warm and dry.  Head: Normocephalic and atraumatic  Lungs: Respirations unlabored; RLL pneumonia CVS exam: normal rate and regular rhythm.  Abdomen: Soft; nontender; nondistended; normoactive bowel sounds; no masses or hepatosplenomegaly  Musculoskeletal: No deformities; no active joint inflammation  Extremities: No edema, cyanosis, clubbing  Vessels: Symmetric bilaterally  Neurologic: Alert and oriented; speech intact; face symmetrical; moves all extremities well; CNII-XII intact without focal deficit  Assessment:  1. Pneumonia of right lower lobe due to infectious organism [facility])     Plan:  Patient clinically looks better today compared to Wednesday but his O2 sats are much worse; albuterol nebulizer treatment is given in office but the highest reading we can get in the office is at 90 but average is 87%; based on this am recommending ER evaluation for possible admission for pneumonia  treatment. Son agrees and will take son to [facility] for further evaluation; nurse at [facility]   No Follow-up on file.  No orders of the defined types were placed in this encounter.   Requested Prescriptions    No prescriptions requested or ordered in this encounter

## 2017-05-28 NOTE — ED Provider Notes (Signed)
[facility] Provider Note  CSN: [id number] Arrival date & time: 05/28/17 1404  Chief Complaint(s) Shortness of Breath and Cough  HPI Tanner [name] is a 71 y.o. male with an extensive past medical history listed below who presents to the emergency department for hypoxia in the setting of pneumonia.  He was seen by PCP several days ago and diagnosed with PNA and placed on Doxy. Today went for follow up and noted to be hypoxic to the 80s on room air.  Patient denies any change in his cough.  Endorses some mild shortness of breath.  Denies any chest pain, nausea, vomiting, abdominal pain, diarrhea.  No alleviating or aggravating factors.  HPI   Past Medical History Past Medical History:  Diagnosis Date  . Acute kidney failure ([place])   . Arthritis   . Back pain   . BPH (benign prostatic hypertrophy)   . Clostridium difficile infection   . Clotting disorder ([place])   . Depression   . Diabetes ([place]) 12/11/2016  . DVT (deep venous thrombosis) ([place])   . Hypertension   . Low back pain potentially associated with spinal stenosis   . Neuromuscular disorder ([place])   . Neuropathy of lower extremity    bilateral  . OSA (obstructive sleep apnea)   . PE (pulmonary embolism)   . Ventral hernia    Patient Active Problem List   Diagnosis Date Noted  . Community acquired pneumonia 05/28/2017  . Recurrent Clostridium difficile diarrhea 05/28/2017  . Peripheral neuropathy 03/17/2017  . Facial numbness 03/17/2017  . Mass of left side of neck 01/22/2017  . Acute parotitis 12/23/2016  . Diabetes ([place]) 12/11/2016  . Allergic rhinitis 12/11/2016  . Degenerative arthritis of left knee 01/30/2016  . Morbid obesity ([place]) 01/30/2016  . Lip abscess 03/15/2015  . Routine general medical examination at a health care facility 04/20/2014  . C. difficile colitis 12/19/2013  . History of pulmonary embolus (PE) 12/19/2013  . Subacromial bursitis 05/10/2013  . INSOMNIA  UNSPECIFIED 09/16/2007  . Anxiety state 03/29/2007  . Major depressive disorder 03/29/2007  . OBSTRUCTIVE SLEEP APNEA 03/29/2007  . GLAUCOMA NOS 03/29/2007  . BPH (benign prostatic hyperplasia) 03/29/2007  . OSTEOARTHRITIS, KNEE 03/29/2007  . ELEVATED BP W/O HYPERTENSION 03/29/2007  . BELL'S PALSY, HX OF 03/29/2007   Home Medication(s) Prior to Admission medications   Medication Sig Start Date End Date Taking? Authorizing Provider  acetaminophen (TYLENOL) 500 MG tablet Take 1,000 mg by mouth as needed. 02/21/13  Yes Norins, Heinz Knuckles, MD  aspirin 81 MG tablet Take 162 mg by mouth daily.   Yes [provider]  atenolol (TENORMIN) 25 MG tablet Take 12.5 mg by mouth daily.   Yes [provider]  budesonide-formoterol (SYMBICORT) 160-4.5 MCG/ACT inhaler Inhale 2 puffs into the lungs 2 (two) times daily. 2 PUFFS IN THE MORNING AND 2 PUFFS IN THE EVENING   Yes [provider]  buPROPion (WELLBUTRIN SR) 150 MG 12 hr tablet Take 150 mg by mouth 2 (two) times daily.   Yes [provider]  cetirizine (ZYRTEC) 10 MG tablet Take 1 tablet (10 mg total) by mouth daily. 12/11/16 12/11/17 Yes Biagio Borg, MD  doxycycline (VIBRA-TABS) 100 MG tablet Take 1 tablet (100 mg total) by mouth 2 (two) times daily. 05/26/17  Yes Sherlene Shams, FNP  finasteride (PROSCAR) 5 MG tablet Take 5 mg by mouth at bedtime.   Yes [provider]  furosemide (LASIX) 20 MG  tablet Take 20 mg by mouth every other day.   Yes [provider]  gabapentin (NEURONTIN) 300 MG capsule 600 mg. Take 3 tablets twice daily AND 300 MG AT BEDTIME   Yes [provider]  hydrochlorothiazide (HYDRODIURIL) 25 MG tablet Take 25 mg by mouth daily.   Yes [provider]  HYDROcodone-acetaminophen (NORCO/VICODIN) 5-325 MG tablet Take 1 tablet every 6 (six) hours as needed by mouth for moderate pain. 05/03/17  Yes Biagio Borg, MD  ketoconazole (NIZORAL) 2 % shampoo Apply topically  daily.   Yes [provider]  Menthol-Methyl Salicylate (THERA-GESIC PLUS) CREA Apply 1 application topically at bedtime. Apply to lower back   Yes [provider]  metFORMIN (GLUCOPHAGE-XR) 500 MG 24 hr tablet Take 1 tablet (500 mg total) by mouth daily with breakfast. 12/11/16  Yes Biagio Borg, MD  Multiple Vitamins-Minerals (MULTIVITAMINS THER. W/MINERALS) TABS Take 1 tablet by mouth daily.   Yes [provider]  nortriptyline (PAMELOR) 10 MG capsule Start nortriptyline 10mg  at bedtime for 2 week, then increase to 2 tablet at bedtime 04/07/17  Yes Patel, Donika K, DO  oxyCODONE-acetaminophen (PERCOCET) 10-325 MG tablet Take 1 tablet by mouth every 6 (six) hours as needed for pain. 03/25/17  Yes Biagio Borg, MD  potassium chloride SA (K-DUR,KLOR-CON) 20 MEQ tablet Take 20 mEq by mouth as needed. 12/21/13  Yes Theodis Blaze, MD  traMADol Veatrice Bourbon) 50 MG tablet Take by mouth every 6 (six) hours as needed.   Yes [provider]  zolpidem (AMBIEN) 10 MG tablet Take 1 tablet (10 mg total) by mouth at bedtime. For sleep 12/21/13  Yes Theodis Blaze, MD                                                                                                                                    Past Surgical History Past Surgical History:  Procedure Laterality Date  . EYE SURGERY    . HERNIA REPAIR     Family History Family History  Problem Relation Age of Onset  . Diabetes Mother   . Pneumonia Mother   . Alzheimer's disease Mother   . Other Father 88       Drowned on boating accident  . Colon cancer Neg Hx   . Colon polyps Neg Hx     Social History Social History   Tobacco Use  . Smoking status: Former Smoker    Packs/day: 2.00    Years: 33.00    Pack years: 66.00    Types: Cigarettes    Start date: 08/30/1968    Last attempt to quit: 07/02/2001    Years since quitting: 15.9  . Smokeless tobacco: Never Used  . Tobacco comment: quit 12 years ago  Substance Use  Topics  . Alcohol use: Yes    Comment: occasional  . Drug use: No   Allergies Codeine sulfate  Review of  Systems Review of Systems All other systems are reviewed and are negative for acute change except as noted in the HPI  Physical Exam Vital Signs  I have reviewed the triage vital signs BP (!) 159/62 (BP Location: Left Arm)   Pulse 77   Temp 97.8 F (36.6 C) (Oral)   Resp 18   SpO2 90%   Physical Exam  Constitutional: He is oriented to person, place, and time. He appears well-developed and well-nourished. No distress.  HENT:  Head: Normocephalic and atraumatic.  Nose: Nose normal.  Eyes: Conjunctivae and EOM are normal. Pupils are equal, round, and reactive to light. Right eye exhibits no discharge. Left eye exhibits no discharge. No scleral icterus.  Neck: Normal range of motion. Neck supple.  Cardiovascular: Normal rate and regular rhythm. Exam reveals no gallop and no friction rub.  No murmur heard. Pulmonary/Chest: Effort normal. No stridor. No respiratory distress. He has wheezes in the right lower field. He has no rales.  Abdominal: Soft. He exhibits no distension. There is no tenderness.  Musculoskeletal: He exhibits no edema or tenderness.  2+ BLE edema  Neurological: He is alert and oriented to person, place, and time.  Skin: Skin is warm and dry. No rash noted. He is not diaphoretic. No erythema.  Psychiatric: He has a normal mood and affect.  Vitals reviewed.   ED Results and Treatments Labs (all labs ordered are listed, but only abnormal results are displayed) Labs Reviewed  CBC WITH DIFFERENTIAL/PLATELET - Abnormal; Notable for the following components:      Result Value   WBC 13.5 (*)    Hemoglobin 12.9 (*)    Neutro Abs 9.8 (*)    Monocytes Absolute 1.1 (*)    All other components within normal limits  COMPREHENSIVE METABOLIC PANEL - Abnormal; Notable for the following components:   Chloride 96 (*)    BUN 22 (*)    Calcium 8.8 (*)    Total  Protein 6.4 (*)    Albumin 2.8 (*)    GFR calc non Af Amer 58 (*)    All other components within normal limits  GLUCOSE, CAPILLARY - Abnormal; Notable for the following components:   Glucose-Capillary 114 (*)    All other components within normal limits  CULTURE, BLOOD (ROUTINE X 2)  CULTURE, BLOOD (ROUTINE X 2)  MRSA PCR SCREENING  BRAIN NATRIURETIC PEPTIDE  INFLUENZA PANEL BY PCR (TYPE A & B)  LEGIONELLA PNEUMOPHILA SEROGP 1 UR AG  I-STAT CG4 LACTIC ACID, ED  I-STAT CG4 LACTIC ACID, ED                                                                                                                         EKG  EKG Interpretation  Date/Time:  Friday May 28 2017 15:25:04 EST Ventricular Rate:  71 PR Interval:    QRS Duration: 154 QT Interval:  432 QTC Calculation: 470 R Axis:   16 Text Interpretation:  Sinus rhythm Right bundle  branch block No significant change since last tracing Confirmed by Addison Lank [phone]) on 05/28/2017 7:52:23 PM      Radiology Dg Chest 2 View  Result Date: 05/28/2017 CLINICAL DATA:  Shortness of breath. EXAM: CHEST  2 VIEW COMPARISON:  05/26/2017 FINDINGS: Mild cardiac enlargement. No pleural effusion. Airspace consolidation within the posterior right lower lobe identified. The appearance is unchanged from the previous exam left lung is relatively clear. IMPRESSION: 1. Persistent posterior right lower lobe airspace opacity which may represent pneumonia. Followup PA and lateral chest X-ray is recommended in 3-4 weeks following trial of antibiotic therapy to ensure resolution and exclude underlying malignancy. Electronically Signed   By: Kerby Moors M.D.   On: 05/28/2017 16:12   Pertinent labs & imaging results that were available during my care of the patient were reviewed by me and considered in my medical decision making (see chart for details).  Medications Ordered in ED Medications  gabapentin (NEURONTIN) capsule 600 mg (not administered)    finasteride (PROSCAR) tablet 5 mg (5 mg Oral Given 05/28/17 2205)  buPROPion (WELLBUTRIN SR) 12 hr tablet 150 mg (150 mg Oral Given 05/28/17 2205)  multivitamin with minerals tablet 1 tablet (not administered)  atenolol (TENORMIN) tablet 12.5 mg (not administered)  hydrochlorothiazide (HYDRODIURIL) tablet 25 mg (not administered)  aspirin EC tablet 162 mg (162 mg Oral Given 05/28/17 2355)  zolpidem (AMBIEN) tablet 5 mg (5 mg Oral Given 05/28/17 2205)  furosemide (LASIX) tablet 20 mg (not administered)  loratadine (CLARITIN) tablet 10 mg (not administered)  gabapentin (NEURONTIN) capsule 300 mg (300 mg Oral Given 05/28/17 2205)  enoxaparin (LOVENOX) injection 60 mg (60 mg Subcutaneous Given 05/28/17 2207)  insulin aspart (novoLOG) injection 0-15 Units (not administered)  azithromycin (ZITHROMAX) tablet 500 mg (500 mg Oral Given 05/28/17 2205)  vancomycin (VANCOCIN) 50 mg/mL oral solution 125 mg (125 mg Oral Given 05/28/17 2205)  cefTRIAXone (ROCEPHIN) 2 g in dextrose 5 % 50 mL IVPB (0 g Intravenous Stopped 05/28/17 2307)  vancomycin (VANCOCIN) 1,250 mg in sodium chloride 0.9 % 250 mL IVPB (not administered)  doxycycline (VIBRAMYCIN) 100 mg in dextrose 5 % 250 mL IVPB (0 mg Intravenous Stopped 05/28/17 2015)  vancomycin (VANCOCIN) 2,000 mg in sodium chloride 0.9 % 500 mL IVPB (0 mg Intravenous Stopped 05/29/17 0012)                                                                                                                                    Procedures Procedures  (including critical care time)  Medical Decision Making / ED Course I have reviewed the nursing notes for this encounter and the patient's prior records (if available in EHR or on provided paperwork).  Clinical Course as of May 29 57  Fri May 28, 2017  1911 Patient's has hypoxia with sats to 46 on room air in the setting of known pneumonia currently being treated with doxycycline.  Patient has evidence  of peripheral edema  on Lasix without a diagnosis of heart failure.  Chest x-ray did not reveal any pulmonary edema.  BNP within normal limits.  CBC  With leukocytosis.  Other labs grossly reassuring without significant electrolyte derangements or renal insufficiency.    Patient was placed on 3 L nasal cannula.  Will admit for hypoxia in the setting of pneumonia.  Given dose of IV antibiotics prior to admission.  [PC]    Clinical Course User Index [PC] Shoua Ressler, Grayce Sessions, MD     Final Clinical Impression(s) / ED Diagnoses Final diagnoses:  Community acquired pneumonia of right lower lobe of lung ([place])  Acute respiratory failure with hypoxia ([place])      This chart was dictated using voice recognition software.  Despite best efforts to proofread,  errors can occur which can change the documentation meaning.   Fatima Blank, MD 05/29/17 [phone]

## 2017-05-28 NOTE — ED Notes (Signed)
Bed: WA15 Expected date:  Expected time:  Means of arrival:  Comments: Hold for triage 

## 2017-05-28 NOTE — Progress Notes (Signed)
Pharmacy Antibiotic Note  Tanner [name] is a 71 y.o. male admitted on 05/28/2017 with pneumonia.  Pharmacy has been consulted for vancomycin and ceftriaxone dosing, MD dosing azithromycin and PO vancomycin.  Plan:  Ceftriaxone 2g IV q24h for weight > 100kg (no further dosing adjustments needed)  Vancomycin 2g IV x 1.  Discussed with MD - will check MRSA PCR nasal swab and if negative will d/c vancomycin; if positive, will continue with 1250g IV q24h for estimated AUC 452 (goal 400-500)  Follow up renal function & cultures    Temp (24hrs), Avg:97.6 F (36.4 C), Min:97.4 F (36.3 C), Max:97.8 F (36.6 C)  Recent Labs  Lab 05/28/17 1808 05/28/17 1823 05/28/17 2047  WBC 13.5*  --   --   CREATININE 1.21  --   --   LATICACIDVEN  --  1.08 0.80    Estimated Creatinine Clearance: 70.3 mL/min (by C-G formula based on SCr of 1.21 mg/dL).    Allergies  Allergen Reactions  . Codeine Sulfate Itching and Nausea Only    Antimicrobials this admission: 12/14 Doxycycline x 1 12/14 Vancomycin IV >> 12/14 Vancomycin PO >> 12/14 Rocephin >> 12/14 Azithromycin >>  Dose adjustments this admission:   Microbiology results: 12/14 BCx: sent 12/14 MRSA PCR: sent  Thank you for allowing pharmacy to be a part of this patient's care.  Peggyann Juba, PharmD, BCPS Pager: [phone] 05/28/2017 9:22 PM

## 2017-05-28 NOTE — Addendum Note (Signed)
Addended by: Karren Cobble on: 05/28/2017 02:45 PM   Modules accepted: Orders

## 2017-05-29 LAB — GLUCOSE, CAPILLARY
GLUCOSE-CAPILLARY: 130 mg/dL — AB (ref 65–99)
Glucose-Capillary: 110 mg/dL — ABNORMAL HIGH (ref 65–99)
Glucose-Capillary: 94 mg/dL (ref 65–99)
Glucose-Capillary: 94 mg/dL (ref 65–99)

## 2017-05-29 LAB — MRSA PCR SCREENING: MRSA BY PCR: NEGATIVE

## 2017-05-29 MED ORDER — TRAMADOL HCL 50 MG PO TABS
100.0000 mg | ORAL_TABLET | Freq: Three times a day (TID) | ORAL | Status: DC | PRN
  Administered 2017-05-29 – 2017-05-31 (×4): 100 mg via ORAL
  Filled 2017-05-29 (×4): qty 2

## 2017-05-29 MED ORDER — IPRATROPIUM-ALBUTEROL 0.5-2.5 (3) MG/3ML IN SOLN
3.0000 mL | Freq: Four times a day (QID) | RESPIRATORY_TRACT | Status: DC
  Administered 2017-05-29 (×2): 3 mL via RESPIRATORY_TRACT
  Filled 2017-05-29 (×2): qty 3

## 2017-05-29 MED ORDER — DM-GUAIFENESIN ER 30-600 MG PO TB12
1.0000 | ORAL_TABLET | Freq: Two times a day (BID) | ORAL | Status: DC
  Administered 2017-05-29 – 2017-05-31 (×5): 1 via ORAL
  Filled 2017-05-29 (×5): qty 1

## 2017-05-29 MED ORDER — IPRATROPIUM-ALBUTEROL 0.5-2.5 (3) MG/3ML IN SOLN
3.0000 mL | Freq: Three times a day (TID) | RESPIRATORY_TRACT | Status: DC
  Administered 2017-05-30 – 2017-05-31 (×4): 3 mL via RESPIRATORY_TRACT
  Filled 2017-05-29 (×4): qty 3

## 2017-05-29 MED ORDER — GABAPENTIN 300 MG PO CAPS
600.0000 mg | ORAL_CAPSULE | Freq: Three times a day (TID) | ORAL | Status: DC
  Administered 2017-05-29 – 2017-05-31 (×7): 600 mg via ORAL
  Filled 2017-05-29 (×7): qty 2

## 2017-05-29 MED ORDER — SACCHAROMYCES BOULARDII 250 MG PO CAPS
250.0000 mg | ORAL_CAPSULE | Freq: Two times a day (BID) | ORAL | Status: DC
  Administered 2017-05-29 – 2017-05-31 (×5): 250 mg via ORAL
  Filled 2017-05-29 (×5): qty 1

## 2017-05-29 NOTE — Progress Notes (Signed)
[facility] Progress Note  Patient: Tanner [name] FBP:[id number]   PCP: Hoyt Koch, MD DOB: 01-13-1946   DOA: 05/28/2017   DOS: 05/29/2017   Date of Service: the patient was seen and examined on 05/29/2017  Subjective: Feeling better, no chest pain abdominal pain but no nausea no vomiting.  Has some shortness of breath and cough.  Brief hospital course: Pt. with PMH of DVT, HTN, type II DM, recurrent C. Difficile, CKD 2, BPH, obesity, chronic pain, former smoker; admitted on 05/28/2017, presented with complaint of cough and shortness of breath, was found to have acute hypoxic respiratory failure from pneumonia. Currently further plan is continue current IV antibiotics.  Assessment and Plan: 1.  Community-acquired pneumonia. Seen by PCP 2 days ago before arrival, started on doxycycline.  Presented with cough and shortness of breath.  Was found hypoxic. Right basal crackles, bilateral expiratory wheezes. Continue current management IV antibiotics and Florastor and oral vancomycin. Monitor cultures. Incentive spirometry and Mucinex.  2.  Recurrent C. difficile colitis. He did not have any history of fecal transplant but that would be the next step should the patient has another C. difficile episode. On oral vancomycin for now. Monitor.  3.  Type 2 diabetes mellitus. Holding home metformin on sliding scale insulin.  4.  History of hypertension. Suspected chronic diastolic CHF. Continue current home regimen.  5.  History of DVT PE. Not on any anticoagulation right now. On DVT prophylaxis. Right lower lobe opacity could be PE but chronology of the event as well as other symptomatology points more toward pneumonia.  6.  Chronic pain. Continuing home regimen.  Diet: cardic diet DVT Prophylaxis: subcutaneous Heparin  Advance goals of care discussion: DNR DNI  Family Communication: family was present at bedside, at the time of interview. The pt provided permission  to discuss medical plan with the family. Opportunity was given to ask question and all questions were answered satisfactorily.   Disposition:  Discharge to home.  Consultants: none Procedures: none  Antibiotics: Anti-infectives (From admission, onward)   Start     Dose/Rate Route Frequency Ordered Stop   05/29/17 2200  vancomycin (VANCOCIN) 1,250 mg in sodium chloride 0.9 % 250 mL IVPB  Status:  Discontinued     1,250 mg 166.7 mL/hr over 90 Minutes Intravenous Every 24 hours 05/28/17 2129 05/29/17 0337   05/28/17 2300  vancomycin (VANCOCIN) 50 mg/mL oral solution 125 mg     125 mg Oral 4 times daily 05/28/17 2102 06/07/17 2159   05/28/17 2200  azithromycin (ZITHROMAX) tablet 500 mg     500 mg Oral Daily at bedtime 05/28/17 2101     05/28/17 2200  vancomycin (VANCOCIN) 2,000 mg in sodium chloride 0.9 % 500 mL IVPB     2,000 mg 250 mL/hr over 120 Minutes Intravenous  Once 05/28/17 2120 05/29/17 0012   05/28/17 2200  cefTRIAXone (ROCEPHIN) 2 g in dextrose 5 % 50 mL IVPB     2 g 100 mL/hr over 30 Minutes Intravenous Every 24 hours 05/28/17 2122     05/28/17 1730  doxycycline (VIBRAMYCIN) 100 mg in dextrose 5 % 250 mL IVPB     100 mg 125 mL/hr over 120 Minutes Intravenous  Once 05/28/17 1727 05/28/17 2015       Objective: Physical Exam: Vitals:   05/28/17 2130 05/29/17 0650 05/29/17 1438 05/29/17 1504  BP: (!) 124/58 (!) 120/53 (!) 119/52   Pulse: 76 70 74   Resp: 17 19 20    Temp:  97.8 F (36.6 C) 97.8 F (36.6 C) 97.8 F (36.6 C)   TempSrc: Oral Oral Oral   SpO2: 95% 98% 100% 93%  Weight: 126.1 kg (278 lb)     Height: 5\' 7"  (1.702 m)       Intake/Output Summary (Last 24 hours) at 05/29/2017 1747 Last data filed at 05/29/2017 1300 Gross per 24 hour  Intake 530 ml  Output 550 ml  Net -20 ml   Filed Weights   05/28/17 2130  Weight: 126.1 kg (278 lb)   General: Alert, Awake and Oriented to Time, Place and Person. Appear in mild distress, affect appropriate Eyes:  PERRL, Conjunctiva normal ENT: Oral Mucosa clear moist. Neck: no JVD, no Abnormal Mass Or lumps Cardiovascular: S1 and S2 Present, no Murmur, Peripheral Pulses Present Respiratory: normal respiratory effort, Bilateral Air entry equal and Decreased, no use of accessory muscle, right basal Crackles, bilateral wheezes Abdomen: Bowel Sound present, Soft and no tenderness, no hernia Skin: no redness, no Rash, no induration Extremities: bilateral Pedal edema, no calf tenderness Neurologic: Grossly no focal neuro deficit. Bilaterally Equal motor strength  Data Reviewed: CBC: Recent Labs  Lab 05/28/17 1808  WBC 13.5*  NEUTROABS 9.8*  HGB 12.9*  HCT 39.8  MCV 87.1  PLT 086   Basic Metabolic Panel: Recent Labs  Lab 05/28/17 1808  NA 136  K 3.7  CL 96*  CO2 28  GLUCOSE 86  BUN 22*  CREATININE 1.21  CALCIUM 8.8*    Liver Function Tests: Recent Labs  Lab 05/28/17 1808  AST 23  ALT 22  ALKPHOS 74  BILITOT 0.7  PROT 6.4*  ALBUMIN 2.8*   No results for input(s): LIPASE, AMYLASE in the last 168 hours. No results for input(s): AMMONIA in the last 168 hours. Coagulation Profile: No results for input(s): INR, PROTIME in the last 168 hours. Cardiac Enzymes: No results for input(s): CKTOTAL, CKMB, CKMBINDEX, TROPONINI in the last 168 hours. BNP (last 3 results) No results for input(s): PROBNP in the last 8760 hours. CBG: Recent Labs  Lab 05/28/17 2142 05/29/17 0809 05/29/17 1152 05/29/17 1712  GLUCAP 114* 110* 94 130*   Studies: No results found.  Scheduled Meds: . aspirin EC  162 mg Oral Daily  . atenolol  12.5 mg Oral Daily  . azithromycin  500 mg Oral QHS  . buPROPion  150 mg Oral BID  . dextromethorphan-guaiFENesin  1 tablet Oral BID  . enoxaparin (LOVENOX) injection  60 mg Subcutaneous Q24H  . finasteride  5 mg Oral QHS  . furosemide  20 mg Oral QODAY  . gabapentin  300 mg Oral QHS  . gabapentin  600 mg Oral TID PC  . hydrochlorothiazide  25 mg Oral Daily    . insulin aspart  0-15 Units Subcutaneous TID WC  . ipratropium-albuterol  3 mL Nebulization Q6H  . loratadine  10 mg Oral Daily  . multivitamin with minerals  1 tablet Oral Daily  . saccharomyces boulardii  250 mg Oral BID  . vancomycin  125 mg Oral QID  . zolpidem  5 mg Oral QHS   Continuous Infusions: . cefTRIAXone (ROCEPHIN)  IV Stopped (05/28/17 2307)   PRN Meds: traMADol  Time spent: 35 minutes  Author: Berle Mull, MD [facility] Pager: [phone] 05/29/2017 5:47 PM  If 7PM-7AM, please contact night-coverage at [email or web address], password [facility]

## 2017-05-30 ENCOUNTER — Inpatient Hospital Stay (HOSPITAL_COMMUNITY): Payer: Medicare HMO

## 2017-05-30 DIAGNOSIS — J9601 Acute respiratory failure with hypoxia: Secondary | ICD-10-CM

## 2017-05-30 LAB — GLUCOSE, CAPILLARY
GLUCOSE-CAPILLARY: 95 mg/dL (ref 65–99)
GLUCOSE-CAPILLARY: 127 mg/dL — AB (ref 65–99)
GLUCOSE-CAPILLARY: 144 mg/dL — AB (ref 65–99)
Glucose-Capillary: 121 mg/dL — ABNORMAL HIGH (ref 65–99)

## 2017-05-30 LAB — CBC
HEMATOCRIT: 40.5 % (ref 39.0–52.0)
Hemoglobin: 13.1 g/dL (ref 13.0–17.0)
MCH: 28.1 pg (ref 26.0–34.0)
MCHC: 32.3 g/dL (ref 30.0–36.0)
MCV: 86.9 fL (ref 78.0–100.0)
Platelets: 274 10*3/uL (ref 150–400)
RBC: 4.66 MIL/uL (ref 4.22–5.81)
RDW: 15 % (ref 11.5–15.5)
WBC: 9.8 10*3/uL (ref 4.0–10.5)

## 2017-05-30 LAB — BASIC METABOLIC PANEL
Anion gap: 11 (ref 5–15)
BUN: 20 mg/dL (ref 6–20)
CHLORIDE: 98 mmol/L — AB (ref 101–111)
CO2: 29 mmol/L (ref 22–32)
CREATININE: 1.23 mg/dL (ref 0.61–1.24)
Calcium: 9 mg/dL (ref 8.9–10.3)
GFR calc Af Amer: >60 mL/min (ref >60)
GFR calc non Af Amer: 57 mL/min — ABNORMAL LOW (ref >60)
GLUCOSE: 114 mg/dL — AB (ref 65–99)
POTASSIUM: 3.5 mmol/L (ref 3.5–5.1)
Sodium: 138 mmol/L (ref 135–145)

## 2017-05-30 LAB — ECHOCARDIOGRAM COMPLETE
Height: 67 in
Weight: 4448 oz

## 2017-05-30 LAB — MAGNESIUM: Magnesium: 1.7 mg/dL (ref 1.7–2.4)

## 2017-05-30 LAB — LEGIONELLA PNEUMOPHILA SEROGP 1 UR AG: L. pneumophila Serogp 1 Ur Ag: NEGATIVE

## 2017-05-30 MED ORDER — FUROSEMIDE 10 MG/ML IJ SOLN
20.0000 mg | Freq: Once | INTRAMUSCULAR | Status: AC
  Administered 2017-05-30: 20 mg via INTRAVENOUS
  Filled 2017-05-30: qty 2

## 2017-05-30 NOTE — Progress Notes (Signed)
[facility] Progress Note  Patient: Tanner [name] HBZ:[id number]   PCP: Hoyt Koch, MD DOB: Mar 18, 1946   DOA: 05/28/2017   DOS: 05/30/2017   Date of Service: the patient was seen and examined on 05/30/2017  Subjective: Feeling better, remains hypoxic, dropped to 80 on ambulation.  Brief hospital course: Pt. with PMH of DVT, HTN, type II DM, recurrent C. Difficile, CKD 2, BPH, obesity, chronic pain, former smoker; admitted on 05/28/2017, presented with complaint of cough and shortness of breath, was found to have acute hypoxic respiratory failure from pneumonia. Currently further plan is continue current IV antibiotics.  Assessment and Plan: 1.  Community-acquired pneumonia. Acute hypoxemic respiratory failure.  Seen by PCP 2 days ago before arrival, started on doxycycline.  Presented with cough and shortness of breath.  Was found hypoxic. Right basal crackles, bilateral expiratory wheezes. Continue current management IV antibiotics and Florastor and oral vancomycin. Monitor cultures. Incentive spirometry and Mucinex. Check lower extremity Doppler to rule out DVT, if positive will require CT chest PE protocol.  2.  Recurrent C. difficile colitis. He did not have any history of fecal transplant but that would be the next step should the patient has another C. difficile episode. On oral vancomycin for now. Monitor.  3.  Type 2 diabetes mellitus. Holding home metformin on sliding scale insulin.  4.  Hypertension. Suspected chronic diastolic CHF. Continue current home regimen. Give IV Lasix x1. Check echocardiogram  5.  History of DVT PE. Not on any anticoagulation right now. On DVT prophylaxis. Right lower lobe opacity could be PE but chronology of the event as well as other symptomatology points more toward pneumonia.  6.  Chronic pain. Continuing home regimen.  Diet: cardic diet DVT Prophylaxis: subcutaneous Heparin  Advance goals of care discussion:  DNR DNI  Family Communication: family was present at bedside, at the time of interview. The pt provided permission to discuss medical plan with the family. Opportunity was given to ask question and all questions were answered satisfactorily.   Disposition:  Discharge to home.  Consultants: none Procedures: none  Antibiotics: Anti-infectives (From admission, onward)   Start     Dose/Rate Route Frequency Ordered Stop   05/29/17 2200  vancomycin (VANCOCIN) 1,250 mg in sodium chloride 0.9 % 250 mL IVPB  Status:  Discontinued     1,250 mg 166.7 mL/hr over 90 Minutes Intravenous Every 24 hours 05/28/17 2129 05/29/17 0337   05/28/17 2300  vancomycin (VANCOCIN) 50 mg/mL oral solution 125 mg     125 mg Oral 4 times daily 05/28/17 2102 06/07/17 2159   05/28/17 2200  azithromycin (ZITHROMAX) tablet 500 mg     500 mg Oral Daily at bedtime 05/28/17 2101     05/28/17 2200  vancomycin (VANCOCIN) 2,000 mg in sodium chloride 0.9 % 500 mL IVPB     2,000 mg 250 mL/hr over 120 Minutes Intravenous  Once 05/28/17 2120 05/29/17 0012   05/28/17 2200  cefTRIAXone (ROCEPHIN) 2 g in dextrose 5 % 50 mL IVPB     2 g 100 mL/hr over 30 Minutes Intravenous Every 24 hours 05/28/17 2122     05/28/17 1730  doxycycline (VIBRAMYCIN) 100 mg in dextrose 5 % 250 mL IVPB     100 mg 125 mL/hr over 120 Minutes Intravenous  Once 05/28/17 1727 05/28/17 2015       Objective: Physical Exam: Vitals:   05/29/17 2141 05/30/17 0555 05/30/17 0851 05/30/17 1421  BP:  (!) 120/47    Pulse:  73    Resp:  18    Temp:  98.6 F (37 C)    TempSrc:  Oral    SpO2: 92% 94% 98% 93%  Weight:      Height:        Intake/Output Summary (Last 24 hours) at 05/30/2017 1602 Last data filed at 05/30/2017 9629 Gross per 24 hour  Intake 480 ml  Output 300 ml  Net 180 ml   Filed Weights   05/28/17 2130  Weight: 126.1 kg (278 lb)   General: Alert, Awake and Oriented to Time, Place and Person. Appear in mild distress, affect  appropriate Eyes: PERRL, Conjunctiva normal ENT: Oral Mucosa clear moist. Neck: no JVD, no Abnormal Mass Or lumps Cardiovascular: S1 and S2 Present, no Murmur, Peripheral Pulses Present Respiratory: normal respiratory effort, Bilateral Air entry equal and Decreased, no use of accessory muscle, right basal Crackles, bilateral wheezes Abdomen: Bowel Sound present, Soft and no tenderness, no hernia Skin: no redness, no Rash, no induration Extremities: bilateral Pedal edema, no calf tenderness Neurologic: Grossly no focal neuro deficit. Bilaterally Equal motor strength  Data Reviewed: CBC: Recent Labs  Lab 05/28/17 1808 05/30/17 0558  WBC 13.5* 9.8  NEUTROABS 9.8*  --   HGB 12.9* 13.1  HCT 39.8 40.5  MCV 87.1 86.9  PLT 277 528   Basic Metabolic Panel: Recent Labs  Lab 05/28/17 1808 05/30/17 0558  NA 136 138  K 3.7 3.5  CL 96* 98*  CO2 28 29  GLUCOSE 86 114*  BUN 22* 20  CREATININE 1.21 1.23  CALCIUM 8.8* 9.0  MG  --  1.7    Liver Function Tests: Recent Labs  Lab 05/28/17 1808  AST 23  ALT 22  ALKPHOS 74  BILITOT 0.7  PROT 6.4*  ALBUMIN 2.8*   No results for input(s): LIPASE, AMYLASE in the last 168 hours. No results for input(s): AMMONIA in the last 168 hours. Coagulation Profile: No results for input(s): INR, PROTIME in the last 168 hours. Cardiac Enzymes: No results for input(s): CKTOTAL, CKMB, CKMBINDEX, TROPONINI in the last 168 hours. BNP (last 3 results) No results for input(s): PROBNP in the last 8760 hours. CBG: Recent Labs  Lab 05/29/17 1152 05/29/17 1712 05/29/17 2110 05/30/17 0737 05/30/17 1222  GLUCAP 94 130* 94 121* 95   Studies: No results found.  Scheduled Meds: . aspirin EC  162 mg Oral Daily  . atenolol  12.5 mg Oral Daily  . azithromycin  500 mg Oral QHS  . buPROPion  150 mg Oral BID  . dextromethorphan-guaiFENesin  1 tablet Oral BID  . enoxaparin (LOVENOX) injection  60 mg Subcutaneous Q24H  . finasteride  5 mg Oral QHS  .  gabapentin  300 mg Oral QHS  . gabapentin  600 mg Oral TID PC  . insulin aspart  0-15 Units Subcutaneous TID WC  . ipratropium-albuterol  3 mL Nebulization TID  . loratadine  10 mg Oral Daily  . multivitamin with minerals  1 tablet Oral Daily  . saccharomyces boulardii  250 mg Oral BID  . vancomycin  125 mg Oral QID  . zolpidem  5 mg Oral QHS   Continuous Infusions: . cefTRIAXone (ROCEPHIN)  IV Stopped (05/29/17 2201)   PRN Meds: traMADol  Time spent: 35 minutes  Author: Berle Mull, MD [facility] Pager: [phone] 05/30/2017 4:02 PM  If 7PM-7AM, please contact night-coverage at [email or web address], password [facility]

## 2017-05-30 NOTE — Progress Notes (Signed)
Nutrition Brief Note  Patient identified on the Malnutrition Screening Tool (MST) Report  Pt reported in MST screen, "49 lbs" of weight loss over the past year. Pt with insignificant weight loss of ~14 lb since August. Pt is consuming 100% of meals so far this admission.   Wt Readings from Last 15 Encounters:  05/28/17 278 lb (126.1 kg)  05/28/17 278 lb (126.1 kg)  05/26/17 277 lb (125.6 kg)  04/07/17 288 lb 8 oz (130.9 kg)  03/17/17 289 lb (131.1 kg)  01/22/17 292 lb (132.5 kg)  12/23/16 294 lb (133.4 kg)  12/11/16 295 lb (133.8 kg)  10/06/16 291 lb 8 oz (132.2 kg)  09/24/16 293 lb (132.9 kg)  09/21/16 293 lb 4 oz (133 kg)  09/14/16 293 lb (132.9 kg)  09/07/16 291 lb (132 kg)  08/31/16 293 lb (132.9 kg)  08/13/16 293 lb (132.9 kg)    Body mass index is 43.54 kg/m. Patient meets criteria for morbid obesity based on current BMI.   Current diet order is heart healthy, patient is consuming approximately 100% of meals at this time. Labs and medications reviewed.   No nutrition interventions warranted at this time. If nutrition issues arise, please consult RD.   Clayton Bibles, MS, RD, [place] Dietitian Pager: [phone] After Hours Pager: [phone]

## 2017-05-30 NOTE — Progress Notes (Signed)
  Echocardiogram 2D Echocardiogram has been performed.  Tanner [name] 05/30/2017, 3:23 PM

## 2017-05-30 NOTE — Progress Notes (Signed)
SATURATION QUALIFICATIONS: (This note is used to comply with regulatory documentation for home oxygen)  Patient Saturations on Room Air at Rest = 95% - 96%  Patient Saturations on Room Air while Ambulating = 84%  Patient Saturations on 3 Liters of oxygen while Ambulating = 94% - 96% Saturation remained between 90% - 91% on 2 liters while ambulating.

## 2017-05-30 NOTE — Progress Notes (Signed)
SATURATION QUALIFICATIONS: (This note is used to comply with regulatory documentation for home oxygen)  Patient Saturations on Room Air at Rest = 95%-96%  Patient Saturations on Room Air while Ambulating = 84%

## 2017-05-31 ENCOUNTER — Inpatient Hospital Stay (HOSPITAL_COMMUNITY): Payer: Medicare HMO

## 2017-05-31 ENCOUNTER — Encounter (HOSPITAL_COMMUNITY): Payer: Self-pay | Admitting: Radiology

## 2017-05-31 DIAGNOSIS — J9601 Acute respiratory failure with hypoxia: Secondary | ICD-10-CM

## 2017-05-31 DIAGNOSIS — R609 Edema, unspecified: Secondary | ICD-10-CM

## 2017-05-31 DIAGNOSIS — I82402 Acute embolism and thrombosis of unspecified deep veins of left lower extremity: Secondary | ICD-10-CM | POA: Insufficient documentation

## 2017-05-31 LAB — BASIC METABOLIC PANEL
ANION GAP: 12 (ref 5–15)
BUN: 22 mg/dL — ABNORMAL HIGH (ref 6–20)
CALCIUM: 9.2 mg/dL (ref 8.9–10.3)
CO2: 29 mmol/L (ref 22–32)
Chloride: 98 mmol/L — ABNORMAL LOW (ref 101–111)
Creatinine, Ser: 1.15 mg/dL (ref 0.61–1.24)
GFR calc Af Amer: >60 mL/min (ref >60)
GFR calc non Af Amer: >60 mL/min (ref >60)
GLUCOSE: 109 mg/dL — AB (ref 65–99)
Potassium: 3.7 mmol/L (ref 3.5–5.1)
Sodium: 139 mmol/L (ref 135–145)

## 2017-05-31 LAB — CBC
HCT: 42.6 % (ref 39.0–52.0)
Hemoglobin: 13.7 g/dL (ref 13.0–17.0)
MCH: 28.1 pg (ref 26.0–34.0)
MCHC: 32.2 g/dL (ref 30.0–36.0)
MCV: 87.3 fL (ref 78.0–100.0)
PLATELETS: 288 10*3/uL (ref 150–400)
RBC: 4.88 MIL/uL (ref 4.22–5.81)
RDW: 15.1 % (ref 11.5–15.5)
WBC: 10.6 10*3/uL — AB (ref 4.0–10.5)

## 2017-05-31 LAB — GLUCOSE, CAPILLARY
Glucose-Capillary: 115 mg/dL — ABNORMAL HIGH (ref 65–99)
Glucose-Capillary: 91 mg/dL (ref 65–99)
Glucose-Capillary: 121 mg/dL — ABNORMAL HIGH (ref 65–99)

## 2017-05-31 LAB — MAGNESIUM: Magnesium: 1.9 mg/dL (ref 1.7–2.4)

## 2017-05-31 MED ORDER — ENOXAPARIN SODIUM 120 MG/0.8ML ~~LOC~~ SOLN
120.0000 mg | Freq: Once | SUBCUTANEOUS | Status: AC
Start: 2017-05-31 — End: 2017-05-31
  Administered 2017-05-31: 120 mg via SUBCUTANEOUS
  Filled 2017-05-31 (×2): qty 0.8

## 2017-05-31 MED ORDER — IPRATROPIUM-ALBUTEROL 0.5-2.5 (3) MG/3ML IN SOLN: 3.0000 mL | Freq: Two times a day (BID) | RESPIRATORY_TRACT | Status: DC

## 2017-05-31 MED ORDER — AZITHROMYCIN 500 MG PO TABS: 500.0000 mg | ORAL_TABLET | Freq: Every day | ORAL | 0 refills | Status: DC

## 2017-05-31 MED ORDER — WARFARIN - PHARMACIST DOSING INPATIENT: Freq: Every day | Status: DC

## 2017-05-31 MED ORDER — WARFARIN SODIUM 5 MG PO TABS: 5.0000 mg | ORAL_TABLET | Freq: Every day | ORAL | 0 refills | Status: DC

## 2017-05-31 MED ORDER — IOPAMIDOL (ISOVUE-370) INJECTION 76%
INTRAVENOUS | Status: AC
  Filled 2017-05-31: qty 100

## 2017-05-31 MED ORDER — WARFARIN SODIUM 5 MG PO TABS
5.0000 mg | ORAL_TABLET | Freq: Once | ORAL | Status: AC
  Administered 2017-05-31: 5 mg via ORAL
  Filled 2017-05-31: qty 1

## 2017-05-31 MED ORDER — SACCHAROMYCES BOULARDII 250 MG PO CAPS: 250.0000 mg | ORAL_CAPSULE | Freq: Two times a day (BID) | ORAL | 0 refills | Status: DC

## 2017-05-31 MED ORDER — ENOXAPARIN SODIUM 120 MG/0.8ML ~~LOC~~ SOLN: 120.0000 mg | Freq: Two times a day (BID) | SUBCUTANEOUS | 0 refills | Status: DC

## 2017-05-31 MED ORDER — CEPHALEXIN 500 MG PO CAPS: 500.0000 mg | ORAL_CAPSULE | Freq: Two times a day (BID) | ORAL | 0 refills | Status: AC

## 2017-05-31 MED ORDER — ENOXAPARIN SODIUM 120 MG/0.8ML ~~LOC~~ SOLN
120.0000 mg | Freq: Two times a day (BID) | SUBCUTANEOUS | Status: DC
  Filled 2017-05-31: qty 0.8

## 2017-05-31 MED ORDER — COUMADIN BOOK
Freq: Once | Status: AC
  Administered 2017-05-31: 17:00:00
  Filled 2017-05-31: qty 1

## 2017-05-31 MED ORDER — ENOXAPARIN (LOVENOX) PATIENT EDUCATION KIT
PACK | Freq: Once | Status: AC
  Administered 2017-05-31: 17:00:00
  Filled 2017-05-31: qty 1

## 2017-05-31 MED ORDER — DM-GUAIFENESIN ER 30-600 MG PO TB12: 1.0000 | ORAL_TABLET | Freq: Two times a day (BID) | ORAL | 0 refills | Status: DC

## 2017-05-31 MED ORDER — WARFARIN VIDEO
Freq: Once | Status: AC
  Administered 2017-05-31: 17:00:00

## 2017-05-31 MED ORDER — IOPAMIDOL (ISOVUE-370) INJECTION 76%
100.0000 mL | Freq: Once | INTRAVENOUS | Status: AC | PRN
  Administered 2017-05-31: 100 mL via INTRAVENOUS

## 2017-05-31 NOTE — Progress Notes (Signed)
Bilateral lower extremity venous duplex has been completed. There is evidence of age indeterminate deep vein thrombosis involving the popliteal and intramuscular gastrocnemius veins of the left lower extremity. Results were given to the patient's nurse, [place].  05/31/17 12:02 PM Tanner [name] RVT

## 2017-05-31 NOTE — Progress Notes (Signed)
RN advised by Vascular team that the patient is positive for DVT in left leg. Md notified. Will continue to monitor the patient.

## 2017-05-31 NOTE — Progress Notes (Signed)
SATURATION QUALIFICATIONS: (This note is used to comply with regulatory documentation for home oxygen)  Patient Saturations on Room Air at Rest = 96%  Patient Saturations on Room Air while Ambulating = 91%  Patient Saturations on 0 Liters of oxygen while Ambulating = 91%  Please briefly explain why patient needs home oxygen:

## 2017-05-31 NOTE — Progress Notes (Addendum)
ANTICOAGULATION CONSULT NOTE - Initial Consult  Pharmacy Consult for Enoxaparin + Warfarin Indication: VTE treatment  Allergies  Allergen Reactions  . Codeine Sulfate Itching and Nausea Only    Patient Measurements: Height: 5\' 7"  (170.2 cm) Weight: 278 lb (126.1 kg) IBW/kg (Calculated) : 66.1  Vital Signs: Temp: 98.2 F (36.8 C) (12/17 0615) BP: 120/57 (12/17 0615) Pulse Rate: 73 (12/17 0615)  Labs: Recent Labs    05/28/17 1808 05/30/17 0558 05/31/17 0518  HGB 12.9* 13.1 13.7  HCT 39.8 40.5 42.6  PLT 277 274 288  CREATININE 1.21 1.23 1.15    Estimated Creatinine Clearance: 74 mL/min (by C-G formula based on SCr of 1.15 mg/dL).   Medical History: Past Medical History:  Diagnosis Date  . Acute kidney failure ([place])   . Arthritis   . Back pain   . BPH (benign prostatic hypertrophy)   . Clostridium difficile infection   . Clotting disorder ([place])   . Depression   . Diabetes ([place]) 12/11/2016  . DVT (deep venous thrombosis) ([place])   . Hypertension   . Low back pain potentially associated with spinal stenosis   . Neuromuscular disorder ([place])   . Neuropathy of lower extremity    bilateral  . OSA (obstructive sleep apnea)   . PE (pulmonary embolism)   . Ventral hernia     Medications:  Scheduled:  . aspirin EC  162 mg Oral Daily  . atenolol  12.5 mg Oral Daily  . azithromycin  500 mg Oral QHS  . buPROPion  150 mg Oral BID  . coumadin book   Does not apply Once  . dextromethorphan-guaiFENesin  1 tablet Oral BID  . [START ON 06/01/2017] enoxaparin (LOVENOX) injection  120 mg Subcutaneous Q12H  . enoxaparin (LOVENOX) injection  120 mg Subcutaneous Once  . enoxaparin   Does not apply Once  . finasteride  5 mg Oral QHS  . gabapentin  300 mg Oral QHS  . gabapentin  600 mg Oral TID PC  . insulin aspart  0-15 Units Subcutaneous TID WC  . iopamidol      . ipratropium-albuterol  3 mL Nebulization BID  . loratadine  10 mg Oral Daily  . multivitamin with minerals  1  tablet Oral Daily  . saccharomyces boulardii  250 mg Oral BID  . vancomycin  125 mg Oral QID  . warfarin  5 mg Oral ONCE-1800  . warfarin   Does not apply Once  . Warfarin - Pharmacist Dosing Inpatient   Does not apply q1800  . zolpidem  5 mg Oral QHS    Assessment: 43 y/oM with PMH of DVT (no longer on anticoagulation), recurrent C.diff, CKD II, BPH obesity, admitted for CAP. Bilateral lower extremity venous duplex today with evidence of age indeterminate DVT on left. Pharmacy consulted to dose enoxaparin and warfarin.    LFTs WNL (on 12/14)  SCr 1.15 with CrCl ~ 74 ml/min  CBC: H/H, Pltc WNL  DDI with warfarin: ASA, Azithromycin  Goal of Therapy:  INR 2-3 Monitor platelets by anticoagulation protocol: Yes   Plan:   Warfarin 5mg  PO x 1 today  Enoxaparin increased to 120mg  SQ q12h (unable to do once daily dosing for this patient due to patient size/weight)-continue for at least 5 days and until INR therapeutic x 2.  Daily PT/INR.  Monitor closely for s/sx of bleeding.   Provided education to patient.  Per Dr. Posey Pronto, patient to continue on aspirin 162mg  daily.    Lindell Spar, PharmD, BCPS  Pager: [phone] 05/31/2017 6:20 PM

## 2017-05-31 NOTE — Discharge Instructions (Signed)

## 2017-05-31 NOTE — Progress Notes (Signed)
[id number] Davis,BSN,RN3,CCM/[phone]/Patient has o2 and supplies at home through his wife does not want to take anymore if needed from himself/son will bring travel o2 tank if needed at discharge.

## 2017-06-01 NOTE — Telephone Encounter (Unsigned)
Copied from [place]. Topic: Appointment Scheduling - Scheduling Inquiry for Clinic >> Jun 01, 2017  1:49 PM Scherrie Gerlach wrote: Reason for CRM: pt discharged today from hospital with coum.  Pt instructed through AVS to have his first coum check on Thursday. Can you please send to [facility] to set up for his first coum check? Please call the wife to schedule >> Jun 01, 2017  4:43 PM Morey Hummingbird wrote: New patient for Coumadin,  Patient is coming in on Friday to see [facility]  Per hospital discharge needs to star tgetting coum checked here

## 2017-06-01 NOTE — Discharge Summary (Signed)
[facility] Discharge Summary   Patient: Tanner [name]   PCP: Hoyt Koch, MD DOB: Oct 12, 1945   Date of admission: 05/28/2017   Date of discharge: 05/31/2017     Discharge Diagnoses:  Active Problems:   Major depressive disorder   BPH (benign prostatic hyperplasia)   History of pulmonary embolus (PE)   Diabetes (HCC)   Peripheral neuropathy   Community acquired pneumonia   Recurrent Clostridium difficile diarrhea   Admitted From: home Disposition:  home  Recommendations for Outpatient Follow-up:  1. Please follow-up with PCP in 1 week.  Patient will need a repeat chest x-ray in 4 weeks to ensure resolution of the pneumonia. 2. Please establish care again with hematology to discuss duration of the anticoagulation short-term versus lifelong as it is a second unprovoked DVT.  Follow-up Information    Volanda Napoleon, MD. Schedule an appointment as soon as possible for a visit in 1 month(s).   Specialty:  Oncology Contact information: [address] [place] [address] [phone]        Hoyt Koch, MD. Schedule an appointment as soon as possible for a visit in 1 week(s).   Specialty:  Internal Medicine Contact information: [place] [id number] [phone]          Diet recommendation: Cardiac diet  Activity: The patient is advised to gradually reintroduce usual activities.  Discharge Condition: good  Code Status: DNR/DNI  History of present illness: As per the H and P dictated on admission, "Tanner [name] is a 71 y.o. male with medical history significant of dvt/pe, htn, dm, recurrent c-diff, ckd2, bph, obesity, chronic pain, who presents referred from pcp for hypoxia and pneumonia.  Presented to pcp 2 days ago complaining of about 2 weeks worsening non-productive cough and general malaise/weakness. Denies fevers. Not a productive cough. No new leg swelling, no hemoptysis. No  recent abx other than what was prescribed at pcp's 2 days ago. Tolerating PO. No chest pain or significant dyspnea. Started on doxycycline by pcp 2 days ago and interms of energy and fatigue was improving, but still with hacking cough. At pcp's today was hypoxic to upper 80s per patient report. Denies significant dyspnea. No history of hypoxia.  "  Hospital Course:  Summary of his active problems in the hospital is as following. 1.  Community-acquired pneumonia. Acute hypoxemic respiratory failure.  Seen by PCP 2 days ago before arrival, started on doxycycline.  Presented with cough and shortness of breath.  Was found hypoxic. Right basal crackles, bilateral expiratory wheezes. Started on IV ceftriaxone and azithromycin in the hospital with oral vancomycin as the patient has history of recurrent C. difficile. On discharge patient will be on 2 days course of Keflex, 2-day course of azithromycin as well as Florastor. Blood cultures are negative for any growth. Incentive spirometry and Mucinex.  2.  Recurrent C. difficile colitis. He did not have any history of fecal transplant but that would be the next step should the patient has another C. difficile episode. Patient has been given oral vancomycin in the hospital, Florastor on discharge.  3.  Type 2 diabetes mellitus. Resuming home medication. Recommend to hold metformin for 48 hours on discharge due to CT scan.  4.  Hypertension. Suspected chronic diastolic CHF. Continue current home regimen. Continuing home regimen for now. Echocardiogram shows preserved EF, unable to make any comments on diastolic dysfunction although the patient has classic symptomatology of right-sided heart  failure.  5.  Recurrent DVT PE unprovoked.  History of DVT PE. Patient had persistent hypoxia, workup was initiated for other etiologies.  Lower extremity Doppler was positive for indeterminate age DVT in the left leg. CT scan of the chest was also  positive for for small pulmonary emboli on the left side. Patient's last lower externally Doppler was negative for any DVT. Based on this patient was recommended to resume anticoagulation. Discussed options with the patient with his 126 kg weight DO AC are not appropriate and therefore patient will be switched to Lovenox for bridging as well as Coumadin. Patient was able to give himself Lovenox before discharge.  5-day supply of Lovenox was provided. Patient was referred to [facility] PCP office for Coumadin monitoring first INR monitoring should be done on Thursday. In the past patient was given 5 mg Coumadin on a daily basis except Wednesday and Saturday for 2.5 mg. Currently will discharge on 5 mg of Coumadin and will do only 7-day blood supply for the supplies should be provided by PCP as well as Coumadin clinic. Since this is patient's second DVT PE episode which is unprovoked, recommend patient to reestablish care with oncology discussed the duration of the anticoagulation as I suspect patient qualifies for lifelong anticoagulation. Since the patient is on anticoagulation will hold aspirin on discharge.  6.  Chronic pain. Continuing home regimen  All other chronic medical condition were stable during the hospitalization.  Patient was ambulatory without any assistance. On the day of the discharge the patient's vitals were stable, and no other acute medical condition were reported by patient. the patient was felt safe to be discharge at home with family.  Procedures and Results:  Echocardiogram   Consultations:  none  DISCHARGE MEDICATION: Allergies as of 05/31/2017      Reactions   Codeine Sulfate Itching, Nausea Only      Medication List    STOP taking these medications   aspirin 81 MG tablet   doxycycline 100 MG tablet Commonly known as:  VIBRA-TABS     TAKE these medications   acetaminophen 500 MG tablet Commonly known as:  TYLENOL Take 1,000 mg by mouth as needed.     atenolol 25 MG tablet Commonly known as:  TENORMIN Take 12.5 mg by mouth daily.   azithromycin 500 MG tablet Commonly known as:  ZITHROMAX Take 1 tablet (500 mg total) by mouth daily.   budesonide-formoterol 160-4.5 MCG/ACT inhaler Commonly known as:  SYMBICORT Inhale 2 puffs into the lungs 2 (two) times daily. 2 PUFFS IN THE MORNING AND 2 PUFFS IN THE EVENING   buPROPion 150 MG 12 hr tablet Commonly known as:  WELLBUTRIN SR Take 150 mg by mouth 2 (two) times daily.   cephALEXin 500 MG capsule Commonly known as:  KEFLEX Take 1 capsule (500 mg total) by mouth 2 (two) times daily for 3 days.   cetirizine 10 MG tablet Commonly known as:  ZYRTEC Take 1 tablet (10 mg total) by mouth daily.   dextromethorphan-guaiFENesin 30-600 MG 12hr tablet Commonly known as:  MUCINEX DM Take 1 tablet by mouth 2 (two) times daily.   enoxaparin 120 MG/0.8ML injection Commonly known as:  LOVENOX Inject 0.8 mLs (120 mg total) into the skin every 12 (twelve) hours for 5 days.   finasteride 5 MG tablet Commonly known as:  PROSCAR Take 5 mg by mouth at bedtime.   furosemide 20 MG tablet Commonly known as:  LASIX Take 20 mg by mouth every other day.  gabapentin 300 MG capsule Commonly known as:  NEURONTIN 600 mg. Take 2 tablets three daily AND 300 MG AT BEDTIME   hydrochlorothiazide 25 MG tablet Commonly known as:  HYDRODIURIL Take 25 mg by mouth daily.   HYDROcodone-acetaminophen 5-325 MG tablet Commonly known as:  NORCO/VICODIN Take 1 tablet every 6 (six) hours as needed by mouth for moderate pain.   ketoconazole 2 % shampoo Commonly known as:  NIZORAL Apply topically daily.   metFORMIN 500 MG 24 hr tablet Commonly known as:  GLUCOPHAGE-XR Take 1 tablet (500 mg total) by mouth daily with breakfast.   multivitamins ther. w/minerals Tabs tablet Take 1 tablet by mouth daily.   nortriptyline 10 MG capsule Commonly known as:  PAMELOR Start nortriptyline 10mg  at bedtime for 2  week, then increase to 2 tablet at bedtime   oxyCODONE-acetaminophen 10-325 MG tablet Commonly known as:  PERCOCET Take 1 tablet by mouth every 6 (six) hours as needed for pain.   potassium chloride SA 20 MEQ tablet Commonly known as:  K-DUR,KLOR-CON Take 20 mEq by mouth as needed.   saccharomyces boulardii 250 MG capsule Commonly known as:  FLORASTOR Take 1 capsule (250 mg total) by mouth 2 (two) times daily.   THERA-GESIC PLUS Crea Apply 1 application topically at bedtime. Apply to lower back   traMADol 50 MG tablet Commonly known as:  ULTRAM Take 100 mg by mouth 3 (three) times daily as needed.   warfarin 5 MG tablet Commonly known as:  COUMADIN Take 1 tablet (5 mg total) by mouth daily.   zolpidem 10 MG tablet Commonly known as:  AMBIEN Take 1 tablet (10 mg total) by mouth at bedtime. For sleep      Allergies  Allergen Reactions  . Codeine Sulfate Itching and Nausea Only   Discharge Instructions    Ambulatory referral to Anticoagulation Monitoring   Complete by:  As directed    INR Goal:  2.0-3.0   Responsible Group:  [facility]   Date of first INR:  06/02/2017   Diet - low sodium heart healthy   Complete by:  As directed    Discharge instructions   Complete by:  As directed    It is important that you read following instructions as well as go over your medication list with RN to help you understand your care after this hospitalization.  Discharge Instructions: Please follow-up with PCP in one week  Please request your primary care physician to go over all Hospital Tests and Procedure/Radiological results at the follow up,  Please get all Hospital records sent to your PCP by signing hospital release before you go home.   Do not take more than prescribed Pain, Sleep and Anxiety Medications. You were cared for by a hospitalist during your hospital stay. If you have any questions about your discharge medications or the care you received while you were in the  hospital after you are discharged, you can call the unit and ask to speak with the hospitalist on call if the hospitalist that took care of you is not available.  Once you are discharged, your primary care physician will handle any further medical issues. Please note that NO REFILLS for any discharge medications will be authorized once you are discharged, as it is imperative that you return to your primary care physician (or establish a relationship with a primary care physician if you do not have one) for your aftercare needs so that they can reassess your need for medications and monitor your  lab values. You Must read complete instructions/literature along with all the possible adverse reactions/side effects for all the Medicines you take and that have been prescribed to you. Take any new Medicines after you have completely understood and accept all the possible adverse reactions/side effects. Wear Seat belts while driving. If you have smoked or chewed Tobacco in the last 2 yrs please stop smoking and/or stop any Recreational drug use.   Increase activity slowly   Complete by:  As directed      Discharge Exam: Filed Weights   05/28/17 2130  Weight: 126.1 kg (278 lb)   Vitals:   05/31/17 0615 05/31/17 1648  BP: (!) 120/57 122/66  Pulse: 73 75  Resp: 18   Temp: 98.2 F (36.8 C) 97.8 F (36.6 C)  SpO2: 94% 95%   General: Appear in no distress, no Rash; Oral Mucosa moist. Cardiovascular: S1 and S2 Present, no Murmur, no JVD Respiratory: Bilateral Air entry present and Clear to Auscultation, no Crackles, no wheezes Abdomen: Bowel Sound present, Soft and no tenderness Extremities: trace Pedal edema, no calf tenderness Neurology: Grossly no focal neuro deficit.  The results of significant diagnostics from this hospitalization (including imaging, microbiology, ancillary and laboratory) are listed below for reference.    Significant Diagnostic Studies: Dg Chest 2 View  Result Date:  05/28/2017 CLINICAL DATA:  Shortness of breath. EXAM: CHEST  2 VIEW COMPARISON:  05/26/2017 FINDINGS: Mild cardiac enlargement. No pleural effusion. Airspace consolidation within the posterior right lower lobe identified. The appearance is unchanged from the previous exam left lung is relatively clear. IMPRESSION: 1. Persistent posterior right lower lobe airspace opacity which may represent pneumonia. Followup PA and lateral chest X-ray is recommended in 3-4 weeks following trial of antibiotic therapy to ensure resolution and exclude underlying malignancy. Electronically Signed   By: Kerby Moors M.D.   On: 05/28/2017 16:12   Dg Chest 2 View  Result Date: 05/27/2017 CLINICAL DATA:  Cough, congestion, wheezing EXAM: CHEST  2 VIEW COMPARISON:  12/19/2013 FINDINGS: Consolidation in the right lower lobe compatible with pneumonia. Lingular atelectasis or scarring. Heart is upper limits normal in size. No effusions or acute bony abnormality. IMPRESSION: Right lower lobe consolidation compatible with pneumonia. Followup PA and lateral chest X-ray is recommended in 3-4 weeks following trial of antibiotic therapy to ensure resolution and exclude underlying malignancy. Electronically Signed   By: Rolm Baptise M.D.   On: 05/27/2017 08:17   Ct Angio Chest Pe W Or Wo Contrast  Result Date: 05/31/2017 CLINICAL DATA:  Left leg deep venous thrombosis. New pulmonary infiltrate. EXAM: CT ANGIOGRAPHY CHEST WITH CONTRAST TECHNIQUE: Multidetector CT imaging of the chest was performed using the standard protocol during bolus administration of intravenous contrast. Multiplanar CT image reconstructions and MIPs were obtained to evaluate the vascular anatomy. CONTRAST:  129mL ISOVUE-370 IOPAMIDOL (ISOVUE-370) INJECTION 76% COMPARISON:  Chest CT dated 05/18/2013 and chest x-rays dated 05/28/2017, 05/26/2017 and 12/19/2013 FINDINGS: Cardiovascular: There are multiple small bilateral pulmonary emboli. Heart size is normal. RV/LV  ratio is normal. Mediastinum/Nodes: Slight prominence of the lymph nodes at the inferior aspect of the right hilum. Lungs/Pleura: There is only a poorly defined lobulated area of consolidation in the right lower lobe with slight adenopathy at the inferior aspect of the right hilum. There is a tiny right effusion or focal pleural thickening along the posterior aspect of the right upper lobe. Left lung is clear. Upper Abdomen: Negative. Musculoskeletal: No acute abnormality. Review of the MIP images confirms the above  findings. IMPRESSION: 1. Multiple bilateral small pulmonary emboli. 2. Poorly defined lobulated area of consolidation in the right lower lobe. The pulmonary arteries to this area are patent. Therefore, I do not think this represents pulmonary infarct. This may represent pneumonia but the possibility of an infiltrating tumor should also be considered. Electronically Signed   By: Lorriane Shire M.D.   On: 05/31/2017 15:38   Ct Maxillofacial W Contrast  Result Date: 05/11/2017 CLINICAL DATA:  71 y/o M; pain and tingling in the left-sided jaw since dental work. EXAM: CT MAXILLOFACIAL WITH CONTRAST TECHNIQUE: Multidetector CT imaging of the maxillofacial structures was performed with intravenous contrast. Multiplanar CT image reconstructions were also generated. CONTRAST:  64mL ISOVUE-300 IOPAMIDOL (ISOVUE-300) INJECTION 61% COMPARISON:  01/28/2017 CT of the neck FINDINGS: Osseous: Left parasymphyseal mandible small bony defect likely representing tooth extraction cavity. No fracture or findings of osteomyelitis. Orbits: Negative. No traumatic or inflammatory finding. Sinuses: Clear. Soft tissues: No inflammatory process or collection identified. Limited intracranial: No significant or unexpected finding. IMPRESSION: Left parasymphyseal mandible small bony defect likely representing tooth extraction cavity. No fracture or findings of osteomyelitis. No soft tissue inflammatory change. Electronically  Signed   By: Kristine Garbe M.D.   On: 05/11/2017 14:01    Microbiology: Recent Results (from the past 240 hour(s))  Culture, blood (routine x 2)     Status: None (Preliminary result)   Collection Time: 05/28/17  8:33 PM  Result Value Ref Range Status   Specimen Description BLOOD RIGHT HAND  Final   Special Requests   Final    BOTTLES DRAWN AEROBIC AND ANAEROBIC Blood Culture adequate volume   Culture   Final    NO GROWTH 2 DAYS Performed at [facility], [place] [address], [place], [place] [address]    Report Status PENDING  Incomplete  Culture, blood (routine x 2)     Status: None (Preliminary result)   Collection Time: 05/28/17  8:41 PM  Result Value Ref Range Status   Specimen Description BLOOD LEFT HAND  Final   Special Requests   Final    BOTTLES DRAWN AEROBIC AND ANAEROBIC Blood Culture adequate volume   Culture   Final    NO GROWTH 2 DAYS Performed at [facility], [place] [address] [place], [place] [address]    Report Status PENDING  Incomplete  MRSA PCR Screening     Status: None   Collection Time: 05/28/17 11:49 PM  Result Value Ref Range Status   MRSA by PCR NEGATIVE NEGATIVE Final    Comment:        The GeneXpert MRSA Assay (FDA approved for NASAL specimens only), is one component of a comprehensive MRSA colonization surveillance program. It is not intended to diagnose MRSA infection nor to guide or monitor treatment for MRSA infections.      Labs: CBC: Recent Labs  Lab 05/28/17 1808 05/30/17 0558 05/31/17 0518  WBC 13.5* 9.8 10.6*  NEUTROABS 9.8*  --   --   HGB 12.9* 13.1 13.7  HCT 39.8 40.5 42.6  MCV 87.1 86.9 87.3  PLT 277 274 696   Basic Metabolic Panel: Recent Labs  Lab 05/28/17 1808 05/30/17 0558 05/31/17 0518  NA 136 138 139  K 3.7 3.5 3.7  CL 96* 98* 98*  CO2 28 29 29   GLUCOSE 86 114* 109*  BUN 22* 20 22*  CREATININE 1.21 1.23 1.15  CALCIUM 8.8* 9.0 9.2  MG  --  1.7 1.9   Liver Function  Tests: Recent Labs   Lab 05/28/17 1808  AST 23  ALT 22  ALKPHOS 74  BILITOT 0.7  PROT 6.4*  ALBUMIN 2.8*   No results for input(s): LIPASE, AMYLASE in the last 168 hours. No results for input(s): AMMONIA in the last 168 hours. Cardiac Enzymes: No results for input(s): CKTOTAL, CKMB, CKMBINDEX, TROPONINI in the last 168 hours. BNP (last 3 results) Recent Labs    05/28/17 1809  BNP 33.8   CBG: Recent Labs  Lab 05/30/17 1713 05/30/17 2045 05/31/17 0734 05/31/17 1156 05/31/17 1624  GLUCAP 127* 144* 115* 91 121*   Time spent: 35 minutes  Signed:  Berle Mull  [facility] 05/31/2017   , 9:17 AM

## 2017-06-02 ENCOUNTER — Telehealth: Payer: Self-pay | Admitting: *Deleted

## 2017-06-02 NOTE — Telephone Encounter (Signed)
Called pt to verify appt that was schedule for this Friday 06/04/17 Hosp f/u. Per wife they are aware, and also suppose to see someione in coumadin clinic as well. Inform pt per chart coumadin appt was also scheduled that morning @ 11:00. Inform aslo had additional questions concerning hospital D/C finished TCM call below.Tanner [name]  Transition Care Management Follow-up Telephone Call   Date discharged? 05/31/17   How have you been since you were released from the hospital? Wife states he is doing ok   Do you understand why you were in the hospital? YES   Do you understand the discharge instructions? YES   Where were you discharged to? Home  Items Reviewed:  Medications reviewed: YES  Allergies reviewed: YES  Dietary changes reviewed: NO  Referrals reviewed: YES- coumadin appt also was made   Functional Questionnaire:   Activities of Daily Living (ADLs):   She  states he are independent in the following: bathing and hygiene, feeding, continence, grooming, toileting and dressing States he require assistance with the following: ambulation   Any transportation issues/concerns?: NO   Any patient concerns? NO   Confirmed importance and date/time of follow-up visits scheduled YES. appt was made for 06/04/17  Provider Appointment booked with [name]  Confirmed with patient if condition begins to worsen call PCP or go to the ER.  Patient was given the office number and encouraged to call back with question or concerns.  : YES

## 2017-06-03 LAB — CULTURE, BLOOD (ROUTINE X 2)
CULTURE: NO GROWTH
CULTURE: NO GROWTH
Special Requests: ADEQUATE
Special Requests: ADEQUATE

## 2017-06-04 ENCOUNTER — Ambulatory Visit: Payer: Medicare HMO | Admitting: General Practice

## 2017-06-04 ENCOUNTER — Ambulatory Visit: Payer: Medicare HMO | Admitting: Internal Medicine

## 2017-06-04 ENCOUNTER — Encounter: Payer: Self-pay | Admitting: Internal Medicine

## 2017-06-04 ENCOUNTER — Other Ambulatory Visit: Payer: Self-pay | Admitting: General Practice

## 2017-06-04 VITALS — BP 138/60 | HR 76 | Temp 97.7°F | Ht 67.0 in | Wt 274.0 lb

## 2017-06-04 DIAGNOSIS — I82402 Acute embolism and thrombosis of unspecified deep veins of left lower extremity: Secondary | ICD-10-CM | POA: Diagnosis not present

## 2017-06-04 DIAGNOSIS — J189 Pneumonia, unspecified organism: Secondary | ICD-10-CM

## 2017-06-04 LAB — POCT INR: INR: 2.2

## 2017-06-04 MED ORDER — WARFARIN SODIUM 5 MG PO TABS: 5.0000 mg | ORAL_TABLET | Freq: Every day | ORAL | 0 refills | Status: DC

## 2017-06-04 NOTE — Assessment & Plan Note (Signed)
This is recurrent from another time. He was started on coumadin and lovenox. Will need lovenox until therapeutic. He states he was told NOACs are bad in the past and will see hematology (seen Dr. Marin Olp in the past). Informed him that likely will be lifelong anticoagulation and he is not sure he wants to do that. Concern over CXR findings (if malignant then this would be provoked PE).

## 2017-06-04 NOTE — Assessment & Plan Note (Signed)
CXR ordered for 4 weeks from now. This is concerning given the DVT/PE on exam which does not have any provocation. If any remnants on CXR will need repeat CT scan to rule out underlying malignancy. He is a former smoker. Done with antibiotics and clinically improving.

## 2017-06-04 NOTE — Patient Instructions (Signed)
Come back the week of January 21st sometime for the chest x-ray. This is located in our basement of the office. We will call you back with the results.   Come back in about 3 moths for a check on the health.

## 2017-06-04 NOTE — Patient Instructions (Addendum)
Pre visit review using our clinic review tool, if applicable. No additional management support is needed unless otherwise documented below in the visit note.  Take 1 tablet daily except 1/2 tablet on Monday and Thursday.  Re-check in 1 week.  Stop Lovenox injections.

## 2017-06-04 NOTE — Progress Notes (Signed)
Medical treatment/procedure(s) were performed by non-physician practitioner and as supervising physician I was immediately available for consultation/collaboration. I agree with above. Stefhanie Kachmar A Mari Battaglia, MD  

## 2017-06-04 NOTE — Progress Notes (Signed)
   Subjective:    Patient ID: Tanner [name], male    DOB: 02-03-1946, 71 y.o.   MRN: [id number]  HPI The patient is a 71 YO man coming in for hospital follow up (in for several problems including CAP treatment failure, new PE/DVT and second started on lovenox and coumadin). He has finished antibiotics and feels like he is coughing less. He is still some SOB and less energy. Overall mildly better. Denies fevers or chills. Coughing some but not as much. Denies diarrhea or constipation. Denies chest pains. Denies taking his pain medication recently as the nortriptyline seems to be helping prevent the pain.   PMH, [facility], social history reviewed and updated.   Review of Systems  Constitutional: Positive for activity change and fatigue. Negative for appetite change and unexpected weight change.  HENT: Negative.   Eyes: Negative.   Respiratory: Positive for cough and shortness of breath. Negative for chest tightness.   Cardiovascular: Negative for chest pain, palpitations and leg swelling.  Gastrointestinal: Negative for abdominal distention, abdominal pain, constipation, diarrhea, nausea and vomiting.  Musculoskeletal: Negative.   Skin: Negative.   Neurological: Positive for weakness.  Psychiatric/Behavioral: Negative.       Objective:   Physical Exam  Constitutional: He is oriented to person, place, and time. He appears well-developed and well-nourished.  Overweight  HENT:  Head: Normocephalic and atraumatic.  Eyes: EOM are normal.  Neck: Normal range of motion.  Cardiovascular: Normal rate and regular rhythm.  Pulmonary/Chest: Effort normal and breath sounds normal. No respiratory distress. He has no wheezes. He has no rales.  Some coarse sounds in the right base which partially clear with coughing.  Abdominal: Soft. Bowel sounds are normal. He exhibits no distension. There is no tenderness. There is no rebound.  Musculoskeletal: He exhibits no edema.  Neurological: He is alert and  oriented to person, place, and time. Coordination normal.  Skin: Skin is warm and dry.  Psychiatric: He has a normal mood and affect.   Vitals:   06/04/17 0947  BP: 138/60  Pulse: 76  Temp: 97.7 F (36.5 C)  TempSrc: Oral  SpO2: 98%  Weight: 274 lb (124.3 kg)  Height: 5\' 7"  (1.702 m)      Assessment & Plan:

## 2017-06-11 ENCOUNTER — Ambulatory Visit: Payer: Medicare HMO | Admitting: General Practice

## 2017-06-11 DIAGNOSIS — I82402 Acute embolism and thrombosis of unspecified deep veins of left lower extremity: Secondary | ICD-10-CM | POA: Diagnosis not present

## 2017-06-11 LAB — POCT INR: INR: 3

## 2017-06-11 NOTE — Patient Instructions (Addendum)
Pre visit review using our clinic review tool, if applicable. No additional management support is needed unless otherwise documented below in the visit note.  Take 1 tablet daily except 1/2 tablet on Monday and Thursday.  Re-check in 1 week.

## 2017-06-18 ENCOUNTER — Ambulatory Visit: Payer: Medicare HMO | Admitting: General Practice

## 2017-06-18 DIAGNOSIS — I82402 Acute embolism and thrombosis of unspecified deep veins of left lower extremity: Secondary | ICD-10-CM | POA: Diagnosis not present

## 2017-06-18 LAB — POCT INR: INR: 2.1

## 2017-06-18 NOTE — Progress Notes (Signed)
Medical treatment/procedure(s) were performed by non-physician practitioner and as supervising physician I was immediately available for consultation/collaboration. I agree with above. Elizabeth A Crawford, MD  

## 2017-06-18 NOTE — Patient Instructions (Addendum)
Pre visit review using our clinic review tool, if applicable. No additional management support is needed unless otherwise documented below in the visit note.  Take 1 tablet daily except 1/2 tablet on Monday/Wed/Fridays.  Re-check in 2 weeks.

## 2017-06-24 DIAGNOSIS — E1142 Type 2 diabetes mellitus with diabetic polyneuropathy: Secondary | ICD-10-CM | POA: Diagnosis not present

## 2017-06-24 DIAGNOSIS — E11620 Type 2 diabetes mellitus with diabetic dermatitis: Secondary | ICD-10-CM | POA: Diagnosis not present

## 2017-06-24 DIAGNOSIS — I10 Essential (primary) hypertension: Secondary | ICD-10-CM | POA: Diagnosis not present

## 2017-06-24 DIAGNOSIS — E8881 Metabolic syndrome: Secondary | ICD-10-CM | POA: Diagnosis not present

## 2017-06-24 DIAGNOSIS — I2510 Atherosclerotic heart disease of native coronary artery without angina pectoris: Secondary | ICD-10-CM | POA: Diagnosis not present

## 2017-06-24 DIAGNOSIS — R69 Illness, unspecified: Secondary | ICD-10-CM | POA: Diagnosis not present

## 2017-06-24 DIAGNOSIS — G4700 Insomnia, unspecified: Secondary | ICD-10-CM | POA: Diagnosis not present

## 2017-06-24 DIAGNOSIS — G8929 Other chronic pain: Secondary | ICD-10-CM | POA: Diagnosis not present

## 2017-06-24 DIAGNOSIS — K219 Gastro-esophageal reflux disease without esophagitis: Secondary | ICD-10-CM | POA: Diagnosis not present

## 2017-06-25 ENCOUNTER — Other Ambulatory Visit: Payer: Self-pay | Admitting: *Deleted

## 2017-06-28 ENCOUNTER — Inpatient Hospital Stay: Payer: Medicare HMO | Attending: Hematology & Oncology | Admitting: Hematology & Oncology

## 2017-06-28 ENCOUNTER — Inpatient Hospital Stay: Payer: Medicare HMO

## 2017-06-28 ENCOUNTER — Other Ambulatory Visit: Payer: Self-pay

## 2017-06-28 VITALS — BP 118/82 | HR 69 | Temp 98.4°F | Resp 20 | Wt 279.0 lb

## 2017-06-28 DIAGNOSIS — I2699 Other pulmonary embolism without acute cor pulmonale: Secondary | ICD-10-CM | POA: Diagnosis not present

## 2017-06-28 DIAGNOSIS — E119 Type 2 diabetes mellitus without complications: Secondary | ICD-10-CM | POA: Diagnosis not present

## 2017-06-28 DIAGNOSIS — I82402 Acute embolism and thrombosis of unspecified deep veins of left lower extremity: Secondary | ICD-10-CM | POA: Insufficient documentation

## 2017-06-28 DIAGNOSIS — Z7901 Long term (current) use of anticoagulants: Secondary | ICD-10-CM | POA: Insufficient documentation

## 2017-06-28 DIAGNOSIS — Z7984 Long term (current) use of oral hypoglycemic drugs: Secondary | ICD-10-CM | POA: Insufficient documentation

## 2017-06-28 DIAGNOSIS — J849 Interstitial pulmonary disease, unspecified: Secondary | ICD-10-CM | POA: Diagnosis not present

## 2017-06-28 LAB — COMPREHENSIVE METABOLIC PANEL
ALBUMIN: 3.2 g/dL — AB (ref 3.5–5.0)
ALT: 20 U/L (ref 0–55)
ANION GAP: 10 (ref 3–11)
AST: 18 U/L (ref 5–34)
Alkaline Phosphatase: 94 U/L (ref 40–150)
BILIRUBIN TOTAL: 0.5 mg/dL (ref 0.2–1.2)
BUN: 13 mg/dL (ref 7–26)
CALCIUM: 9 mg/dL (ref 8.4–10.4)
CO2: 29 mmol/L (ref 22–29)
Chloride: 101 mmol/L (ref 98–109)
Creatinine, Ser: 1.2 mg/dL (ref 0.70–1.30)
GFR calc Af Amer: >60 mL/min (ref >60)
GFR calc non Af Amer: 59 mL/min — ABNORMAL LOW (ref >60)
GLUCOSE: 89 mg/dL (ref 70–140)
Potassium: 4.1 mmol/L (ref 3.5–5.1)
Sodium: 140 mmol/L (ref 136–145)
TOTAL PROTEIN: 6.3 g/dL — AB (ref 6.4–8.3)

## 2017-06-28 LAB — CBC WITH DIFFERENTIAL/PLATELET
BASOS PCT: 1 %
Basophils Absolute: 0.1 10*3/uL (ref 0.0–0.1)
Eosinophils Absolute: 0.5 10*3/uL (ref 0.0–0.5)
Eosinophils Relative: 5 %
HEMATOCRIT: 44.1 % (ref 38.7–49.9)
Hemoglobin: 14.3 g/dL (ref 13.0–17.1)
LYMPHS ABS: 1.6 10*3/uL (ref 0.9–3.3)
LYMPHS PCT: 17 %
MCH: 28.5 pg (ref 28.0–33.4)
MCHC: 32.4 g/dL (ref 32.0–35.9)
MCV: 87.8 fL (ref 82.0–98.0)
MONO ABS: 0.5 10*3/uL (ref 0.1–0.9)
MONOS PCT: 6 %
NEUTROS ABS: 7 10*3/uL — AB (ref 1.5–6.5)
NEUTROS PCT: 71 %
Platelets: 153 10*3/uL (ref 145–400)
RBC: 5.02 MIL/uL (ref 4.20–5.70)
RDW: 16.7 % — AB (ref 11.1–15.7)
WBC: 9.7 10*3/uL (ref 4.0–10.0)

## 2017-06-28 MED ORDER — APIXABAN 5 MG PO TABS: 5.0000 mg | ORAL_TABLET | Freq: Two times a day (BID) | ORAL | 12 refills | Status: DC

## 2017-06-28 NOTE — Progress Notes (Signed)
Hematology and Oncology Follow Up Visit  Tanner [name] [id number] 18-Jan-1946 72 y.o. 06/28/2017   Principle Diagnosis:   Recurrent pulmonary emboli/left leg thrombus  Current Therapy:    Eliquis 5 mg p.o. twice daily-start 06/29/2017.  Patient to stop Coumadin     Interim History:  Tanner [name] is back for a long awaited visit.  We last saw him back in 2016.  He had a blood clot and pulmonary embolism back in September 2014.  He was on Coumadin.  He was then transitioned over to aspirin.  In December of this year, began to feel poorly.  He just did not feel all that well.  He felt a little short of breath.  He had no leg swelling.  His ankles are always swollen.  He had no cough.  There was no hemoptysis.  He actually was hospitalized.  At first, looks like they thought he had pneumonia.  However, more extensive studies were done.  He had a thrombus in his left leg.  On December 17, Doppler was done.  Looks like there was a problem in the popliteal vein and gastrocnemius vein.  A CT angiogram was done on 17 December.  This showed small bilateral pulmonary emboli.  He was given Lovenox.  He was discharged on Coumadin.  He says he feels better.  His wife is not doing all that well.  She has bad interstitial lung disease.  There is been no problems with bleeding.  He has had no fever.  He has had no obvious leg swelling.  He was referred back to the [facility] for any further recommendations.  I want to try to get him off the Coumadin.  I think try to manage Coumadin with him would be very difficult.  I want to try him on Eliquis.  Overall, his performance status is ECOG 1.  Medications:  Current Outpatient Medications:  .  acetaminophen (TYLENOL) 500 MG tablet, Take 1,000 mg by mouth as needed., Disp: , Rfl:  .  atenolol (TENORMIN) 25 MG tablet, Take 12.5 mg by mouth daily., Disp: , Rfl:  .  buPROPion (WELLBUTRIN SR) 150 MG 12 hr tablet, Take 150 mg by mouth 2  (two) times daily., Disp: , Rfl:  .  cetirizine (ZYRTEC) 10 MG tablet, Take 1 tablet (10 mg total) by mouth daily., Disp: 30 tablet, Rfl: 11 .  dextromethorphan-guaiFENesin (MUCINEX DM) 30-600 MG 12hr tablet, Take 1 tablet by mouth 2 (two) times daily., Disp: 30 tablet, Rfl: 0 .  finasteride (PROSCAR) 5 MG tablet, Take 5 mg by mouth at bedtime., Disp: , Rfl:  .  furosemide (LASIX) 20 MG tablet, Take 20 mg by mouth every other day., Disp: , Rfl:  .  gabapentin (NEURONTIN) 300 MG capsule, 600 mg. Take 2 tablets three daily AND 300 MG AT BEDTIME, Disp: , Rfl:  .  hydrochlorothiazide (HYDRODIURIL) 25 MG tablet, Take 25 mg by mouth daily., Disp: , Rfl:  .  ketoconazole (NIZORAL) 2 % shampoo, Apply topically daily., Disp: , Rfl:  .  Menthol-Methyl Salicylate (THERA-GESIC PLUS) CREA, Apply 1 application topically at bedtime. Apply to lower back, Disp: , Rfl:  .  metFORMIN (GLUCOPHAGE-XR) 500 MG 24 hr tablet, Take 1 tablet (500 mg total) by mouth daily with breakfast., Disp: 90 tablet, Rfl: 3 .  Multiple Vitamins-Minerals (MULTIVITAMINS THER. W/MINERALS) TABS, Take 1 tablet by mouth daily., Disp: , Rfl:  .  nortriptyline (PAMELOR) 10 MG capsule, Start nortriptyline 10mg  at bedtime for 2  week, then increase to 2 tablet at bedtime, Disp: 60 capsule, Rfl: 5 .  potassium chloride SA (K-DUR,KLOR-CON) 20 MEQ tablet, Take 20 mEq by mouth as needed., Disp: , Rfl:  .  saccharomyces boulardii (FLORASTOR) 250 MG capsule, Take 1 capsule (250 mg total) by mouth 2 (two) times daily., Disp: 30 capsule, Rfl: 0 .  traMADol (ULTRAM) 50 MG tablet, Take 100 mg by mouth 3 (three) times daily as needed. , Disp: , Rfl:  .  zolpidem (AMBIEN) 10 MG tablet, Take 1 tablet (10 mg total) by mouth at bedtime. For sleep, Disp: 30 tablet, Rfl: 0 .  apixaban (ELIQUIS) 5 MG TABS tablet, Take 1 tablet (5 mg total) by mouth 2 (two) times daily., Disp: 60 tablet, Rfl: 12 .  HYDROcodone-acetaminophen (NORCO/VICODIN) 5-325 MG tablet, Take 1  tablet every 6 (six) hours as needed by mouth for moderate pain. (Patient not taking: Reported on 06/28/2017), Disp: 40 tablet, Rfl: 0  Allergies:  Allergies  Allergen Reactions  . Codeine Sulfate Itching and Nausea Only    Past Medical History, Surgical history, Social history, and Family History were reviewed and updated.  Review of Systems: Review of Systems  Constitutional: Positive for fatigue.  HENT:  Negative.   Eyes: Negative.   Respiratory: Positive for shortness of breath.   Cardiovascular: Positive for leg swelling.  Endocrine: Negative.   Genitourinary: Negative.    Musculoskeletal: Positive for back pain and neck pain.  Neurological: Positive for dizziness.  Hematological: Negative.   Psychiatric/Behavioral: Negative.     Physical Exam:  weight is 279 lb (126.6 kg). His oral temperature is 98.4 F (36.9 C). His blood pressure is 118/82 and his pulse is 69. His respiration is 20 and oxygen saturation is 94%.   Wt Readings from Last 3 Encounters:  06/28/17 279 lb (126.6 kg)  06/04/17 274 lb (124.3 kg)  05/28/17 278 lb (126.1 kg)    Physical Exam  Constitutional: He is oriented to person, place, and time.  HENT:  Head: Normocephalic and atraumatic.  Mouth/Throat: Oropharynx is clear and moist.  Eyes: EOM are normal. Pupils are equal, round, and reactive to light.  Neck: Normal range of motion.  Cardiovascular: Normal rate, regular rhythm and normal heart sounds.  Pulmonary/Chest: Effort normal and breath sounds normal.  Abdominal: Soft. Bowel sounds are normal.  Musculoskeletal: Normal range of motion. He exhibits no edema, tenderness or deformity.  Lymphadenopathy:    He has no cervical adenopathy.  Neurological: He is alert and oriented to person, place, and time.  Skin: Skin is warm and dry. No rash noted. No erythema.  Psychiatric: He has a normal mood and affect. His behavior is normal. Judgment and thought content normal.  Vitals reviewed.    Lab  Results  Component Value Date   WBC 9.7 06/28/2017   HGB 14.3 06/28/2017   HCT 44.1 06/28/2017   MCV 87.8 06/28/2017   PLT 153 06/28/2017     Chemistry      Component Value Date/Time   NA 139 05/31/2017 0518   NA 140 11/07/2014   NA 136 12/29/2013 1356   K 3.7 05/31/2017 0518   K 3.4 12/29/2013 1356   CL 98 (L) 05/31/2017 0518   CL 97 (L) 12/29/2013 1356   CO2 29 05/31/2017 0518   CO2 33 12/29/2013 1356   BUN 22 (H) 05/31/2017 0518   BUN 18 11/07/2014   BUN 15 12/29/2013 1356   CREATININE 1.15 05/31/2017 0518   CREATININE 1.2 12/29/2013 1356  GLU 84 11/07/2014      Component Value Date/Time   CALCIUM 9.2 05/31/2017 0518   CALCIUM 8.9 12/29/2013 1356   ALKPHOS 74 05/28/2017 1808   ALKPHOS 59 12/29/2013 1356   AST 23 05/28/2017 1808   AST 24 12/29/2013 1356   ALT 22 05/28/2017 1808   ALT 27 12/29/2013 1356   BILITOT 0.7 05/28/2017 1808   BILITOT 0.60 12/29/2013 1356         Impression and Plan: Tanner [name] is a 72 year old white male.  He has recurrence of a thrombus.  He is diabetic.  I am sure that that is his risk factor.  He is morbidly obese which is not helping.  I suspect that he will need lifelong anticoagulation.  I think he is shown [name] that he is prone to thromboembolic disease.  I will see how he does with Eliquis.  I probably would not do another CT angiogram or Doppler for another 2 or 3 months.  It was nice to see him again.  Typically I have seen him when he comes in with his wife.  I told him that he does not have to go back to the Coumadin clinic.  He was happy to hear that.  I told him if he has any problems with the Eliquis (i.e. bleeding, shortness of breath, coughing of blood) to call me immediately.  I spent about 45 minutes with him today.   Volanda Napoleon, MD 1/14/20191:14 PM

## 2017-06-29 ENCOUNTER — Ambulatory Visit: Payer: Medicare HMO

## 2017-06-29 LAB — PROTEIN S, TOTAL: Protein S Ag, Total: 34 % — ABNORMAL LOW (ref 60–150)

## 2017-06-29 LAB — LUPUS ANTICOAGULANT PANEL
DRVVT: 48.6 s — AB (ref 0.0–47.0)
PTT LA: 36.7 s (ref 0.0–51.9)

## 2017-06-29 LAB — PROTEIN S ACTIVITY: Protein S Activity: 21 % — ABNORMAL LOW (ref 63–140)

## 2017-06-29 LAB — PROTEIN C, TOTAL: Protein C, Total: 61 % (ref 60–150)

## 2017-06-29 LAB — PROTEIN C ACTIVITY: Protein C Activity: 56 % — ABNORMAL LOW (ref 73–180)

## 2017-06-29 LAB — DRVVT MIX: DRVVT MIX: 37.8 s (ref 0.0–47.0)

## 2017-06-30 LAB — CARDIOLIPIN ANTIBODIES, IGG, IGM, IGA
ANTICARDIOLIPIN IGM: 17 [MPL'U]/mL — AB (ref 0–12)
Anticardiolipin IgG: <9 GPL U/mL (ref 0–14)

## 2017-06-30 LAB — BETA-2-GLYCOPROTEIN I ABS, IGG/M/A: Beta-2 Glyco I IgG: <9 GPI IgG units (ref 0–20)

## 2017-07-02 LAB — FACTOR 5 LEIDEN

## 2017-07-05 ENCOUNTER — Encounter: Payer: Self-pay | Admitting: Internal Medicine

## 2017-07-05 LAB — PROTHROMBIN GENE MUTATION

## 2017-07-08 ENCOUNTER — Ambulatory Visit (INDEPENDENT_AMBULATORY_CARE_PROVIDER_SITE_OTHER)
Admission: RE | Admit: 2017-07-08 | Discharge: 2017-07-08 | Disposition: A | Discharge status: Home / Self Care | Payer: Medicare HMO | Source: Ambulatory Visit | Attending: Internal Medicine | Admitting: Internal Medicine

## 2017-07-08 DIAGNOSIS — J189 Pneumonia, unspecified organism: Secondary | ICD-10-CM | POA: Diagnosis not present

## 2017-07-08 DIAGNOSIS — J181 Lobar pneumonia, unspecified organism: Secondary | ICD-10-CM | POA: Diagnosis not present

## 2017-07-09 ENCOUNTER — Other Ambulatory Visit: Payer: Self-pay | Admitting: Internal Medicine

## 2017-07-09 DIAGNOSIS — J189 Pneumonia, unspecified organism: Secondary | ICD-10-CM

## 2017-07-26 ENCOUNTER — Inpatient Hospital Stay: Payer: Medicare HMO | Attending: Hematology & Oncology | Admitting: Hematology & Oncology

## 2017-07-26 ENCOUNTER — Inpatient Hospital Stay: Payer: Medicare HMO

## 2017-07-26 ENCOUNTER — Other Ambulatory Visit: Payer: Self-pay

## 2017-07-26 VITALS — BP 130/52 | HR 70 | Temp 98.4°F | Resp 19 | Wt 282.8 lb

## 2017-07-26 DIAGNOSIS — M542 Cervicalgia: Secondary | ICD-10-CM

## 2017-07-26 DIAGNOSIS — Z7901 Long term (current) use of anticoagulants: Secondary | ICD-10-CM | POA: Insufficient documentation

## 2017-07-26 DIAGNOSIS — R42 Dizziness and giddiness: Secondary | ICD-10-CM | POA: Diagnosis not present

## 2017-07-26 DIAGNOSIS — R0602 Shortness of breath: Secondary | ICD-10-CM

## 2017-07-26 DIAGNOSIS — E119 Type 2 diabetes mellitus without complications: Secondary | ICD-10-CM | POA: Diagnosis not present

## 2017-07-26 DIAGNOSIS — I2699 Other pulmonary embolism without acute cor pulmonale: Secondary | ICD-10-CM | POA: Diagnosis not present

## 2017-07-26 DIAGNOSIS — M7989 Other specified soft tissue disorders: Secondary | ICD-10-CM | POA: Diagnosis not present

## 2017-07-26 DIAGNOSIS — I82402 Acute embolism and thrombosis of unspecified deep veins of left lower extremity: Secondary | ICD-10-CM | POA: Insufficient documentation

## 2017-07-26 DIAGNOSIS — M549 Dorsalgia, unspecified: Secondary | ICD-10-CM | POA: Insufficient documentation

## 2017-07-26 LAB — CMP (CANCER CENTER ONLY)
ALK PHOS: 93 U/L — AB (ref 26–84)
ALT: 20 U/L (ref 0–55)
AST: 22 U/L (ref 5–34)
Albumin: 3.4 g/dL — ABNORMAL LOW (ref 3.5–5.0)
Anion gap: 5 (ref 5–15)
BUN: 15 mg/dL (ref 7–22)
CHLORIDE: 104 mmol/L (ref 98–108)
CO2: 33 mmol/L (ref 18–33)
CREATININE: 1.1 mg/dL (ref 0.70–1.30)
Calcium: 9.2 mg/dL (ref 8.0–10.3)
Glucose, Bld: 131 mg/dL — ABNORMAL HIGH (ref 73–118)
Potassium: 3.5 mmol/L (ref 3.3–4.7)
SODIUM: 142 mmol/L (ref 128–145)
Total Bilirubin: 0.7 mg/dL (ref 0.2–1.2)
Total Protein: 6.2 g/dL — ABNORMAL LOW (ref 6.4–8.1)

## 2017-07-26 LAB — CBC WITH DIFFERENTIAL (CANCER CENTER ONLY)
Basophils Absolute: 0 10*3/uL (ref 0.0–0.1)
Basophils Relative: 1 %
EOS ABS: 0.3 10*3/uL (ref 0.0–0.5)
Eosinophils Relative: 4 %
HCT: 44 % (ref 38.7–49.9)
Hemoglobin: 14.3 g/dL (ref 13.0–17.1)
LYMPHS ABS: 1.8 10*3/uL (ref 0.9–3.3)
Lymphocytes Relative: 23 %
MCH: 29.3 pg (ref 28.0–33.4)
MCHC: 32.5 g/dL (ref 32.0–35.9)
MCV: 90.2 fL (ref 82.0–98.0)
MONO ABS: 0.5 10*3/uL (ref 0.1–0.9)
MONOS PCT: 6 %
Neutro Abs: 5.3 10*3/uL (ref 1.5–6.5)
Neutrophils Relative %: 66 %
PLATELETS: 175 10*3/uL (ref 145–400)
RBC: 4.88 MIL/uL (ref 4.20–5.70)
RDW: 17 % — AB (ref 11.1–15.7)
WBC: 7.9 10*3/uL (ref 4.0–10.0)

## 2017-07-26 LAB — D-DIMER, QUANTITATIVE (NOT AT ARMC): D DIMER QUANT: 0.3 ug{FEU}/mL (ref 0.00–0.50)

## 2017-07-26 LAB — ANTITHROMBIN III: ANTITHROMB III FUNC: 98 % (ref 75–120)

## 2017-07-26 NOTE — Progress Notes (Signed)
Hematology and Oncology Follow Up Visit  Tanner [name] [id number] 1946-06-01 72 y.o. 07/26/2017   Principle Diagnosis:   Recurrent pulmonary emboli/left leg thrombus  Current Therapy:    Eliquis 5 mg p.o. twice daily-start 06/29/2017.       Interim History:  Tanner [name] is back for for follow-up.  He is much happier now that he is on Eliquis.  He is done well with the Eliquis.  There is been no problems with bleeding.  He has had no problems with cough or shortness of breath.  He has had no leg swelling.  There is been no fever.  He has had no rashes.  He says that he had a chest x-ray which showed pneumonia in the right lower lung.  However, he is not on any antibiotics.  Apparently, Tanner [name] is not doing too well right now.  She has issues with breathing.  She is on chronic oxygen therapy.  Overall, I would say that Tanner performance status is ECOG is 1.    Medications:  Current Outpatient Medications:  .  acetaminophen (TYLENOL) 500 MG tablet, Take 1,000 mg by mouth as needed., Disp: , Rfl:  .  apixaban (ELIQUIS) 5 MG TABS tablet, Take 1 tablet (5 mg total) by mouth 2 (two) times daily., Disp: 60 tablet, Rfl: 12 .  atenolol (TENORMIN) 25 MG tablet, Take 12.5 mg by mouth daily., Disp: , Rfl:  .  buPROPion (WELLBUTRIN SR) 150 MG 12 hr tablet, Take 150 mg by mouth 2 (two) times daily., Disp: , Rfl:  .  cetirizine (ZYRTEC) 10 MG tablet, Take 1 tablet (10 mg total) by mouth daily., Disp: 30 tablet, Rfl: 11 .  dextromethorphan-guaiFENesin (MUCINEX DM) 30-600 MG 12hr tablet, Take 1 tablet by mouth 2 (two) times daily., Disp: 30 tablet, Rfl: 0 .  finasteride (PROSCAR) 5 MG tablet, Take 5 mg by mouth at bedtime., Disp: , Rfl:  .  furosemide (LASIX) 20 MG tablet, Take 20 mg by mouth every other day., Disp: , Rfl:  .  gabapentin (NEURONTIN) 300 MG capsule, 600 mg. Take 2 tablets three daily AND 300 MG AT BEDTIME, Disp: , Rfl:  .  hydrochlorothiazide (HYDRODIURIL) 25 MG tablet, Take 25 mg by  mouth daily., Disp: , Rfl:  .  ketoconazole (NIZORAL) 2 % shampoo, Apply topically daily., Disp: , Rfl:  .  Menthol-Methyl Salicylate (THERA-GESIC PLUS) CREA, Apply 1 application topically at bedtime. Apply to lower back, Disp: , Rfl:  .  metFORMIN (GLUCOPHAGE-XR) 500 MG 24 hr tablet, Take 1 tablet (500 mg total) by mouth daily with breakfast., Disp: 90 tablet, Rfl: 3 .  Multiple Vitamins-Minerals (MULTIVITAMINS THER. W/MINERALS) TABS, Take 1 tablet by mouth daily., Disp: , Rfl:  .  nortriptyline (PAMELOR) 10 MG capsule, Start nortriptyline 10mg  at bedtime for 2 week, then increase to 2 tablet at bedtime, Disp: 60 capsule, Rfl: 5 .  potassium chloride SA (K-DUR,KLOR-CON) 20 MEQ tablet, Take 20 mEq by mouth as needed., Disp: , Rfl:  .  saccharomyces boulardii (FLORASTOR) 250 MG capsule, Take 1 capsule (250 mg total) by mouth 2 (two) times daily., Disp: 30 capsule, Rfl: 0 .  traMADol (ULTRAM) 50 MG tablet, Take 100 mg by mouth 3 (three) times daily as needed. , Disp: , Rfl:  .  zolpidem (AMBIEN) 10 MG tablet, Take 1 tablet (10 mg total) by mouth at bedtime. For sleep, Disp: 30 tablet, Rfl: 0  Allergies:  Allergies  Allergen Reactions  . Codeine Sulfate Itching and Nausea  Only    Past Medical History, Surgical history, Social history, and Family History were reviewed and updated.  Review of Systems: Review of Systems  Constitutional: Positive for fatigue.  HENT:  Negative.   Eyes: Negative.   Respiratory: Positive for shortness of breath.   Cardiovascular: Positive for leg swelling.  Endocrine: Negative.   Genitourinary: Negative.    Musculoskeletal: Positive for back pain and neck pain.  Neurological: Positive for dizziness.  Hematological: Negative.   Psychiatric/Behavioral: Negative.     Physical Exam:  weight is 282 lb 12.8 oz (128.3 kg). Tanner oral temperature is 98.4 F (36.9 C). Tanner blood pressure is 130/52 (abnormal) and Tanner pulse is 70. Tanner respiration is 19 and oxygen  saturation is 97%.   Wt Readings from Last 3 Encounters:  07/26/17 282 lb 12.8 oz (128.3 kg)  06/28/17 279 lb (126.6 kg)  06/04/17 274 lb (124.3 kg)    Physical Exam  Constitutional: He is oriented to person, place, and time.  HENT:  Head: Normocephalic and atraumatic.  Mouth/Throat: Oropharynx is clear and moist.  Eyes: EOM are normal. Pupils are equal, round, and reactive to light.  Neck: Normal range of motion.  Cardiovascular: Normal rate, regular rhythm and normal heart sounds.  Pulmonary/Chest: Effort normal and breath sounds normal.  Abdominal: Soft. Bowel sounds are normal.  Musculoskeletal: Normal range of motion. He exhibits no edema, tenderness or deformity.  Lymphadenopathy:    He has no cervical adenopathy.  Neurological: He is alert and oriented to person, place, and time.  Skin: Skin is warm and dry. No rash noted. No erythema.  Psychiatric: He has a normal mood and affect. Tanner behavior is normal. Judgment and thought content normal.  Vitals reviewed.    Lab Results  Component Value Date   WBC 7.9 07/26/2017   HGB 14.3 06/28/2017   HCT 44.0 07/26/2017   MCV 90.2 07/26/2017   PLT 175 07/26/2017     Chemistry      Component Value Date/Time   NA 142 07/26/2017 1115   NA 140 11/07/2014   NA 136 12/29/2013 1356   K 3.5 07/26/2017 1115   K 3.4 12/29/2013 1356   CL 104 07/26/2017 1115   CL 97 (L) 12/29/2013 1356   CO2 33 07/26/2017 1115   CO2 33 12/29/2013 1356   BUN 15 07/26/2017 1115   BUN 18 11/07/2014   BUN 15 12/29/2013 1356   CREATININE 1.10 07/26/2017 1115   CREATININE 1.2 12/29/2013 1356   GLU 84 11/07/2014      Component Value Date/Time   CALCIUM 9.2 07/26/2017 1115   CALCIUM 8.9 12/29/2013 1356   ALKPHOS 93 (H) 07/26/2017 1115   ALKPHOS 59 12/29/2013 1356   AST 22 07/26/2017 1115   ALT 20 07/26/2017 1115   ALT 27 12/29/2013 1356   BILITOT 0.7 07/26/2017 1115         Impression and Plan: Tanner [name] is a 72 year old white male.  He  has recurrence of a thrombus.  He is diabetic.  I am sure that that is Tanner risk factor.  He is morbidly obese which is not helping.  We will go ahead and get a CT angiogram of Tanner chest we see him back in April.  I also want to get a Doppler of Tanner left leg when I see him back.  He will be on lifelong Eliquis.  Hopefully, we might be able to get him on low-dose therapy.   Volanda Napoleon, MD 2/11/20191:32 PM

## 2017-07-27 LAB — PROTEIN S, TOTAL: Protein S Ag, Total: 67 % (ref 60–150)

## 2017-07-27 LAB — PROTEIN C ACTIVITY: PROTEIN C ACTIVITY: 135 % (ref 73–180)

## 2017-07-27 LAB — PROTEIN S ACTIVITY: PROTEIN S ACTIVITY: 70 % (ref 63–140)

## 2017-07-28 LAB — CARDIOLIPIN ANTIBODIES, IGG, IGM, IGA
Anticardiolipin IgA: <9 APL U/mL (ref 0–11)
Anticardiolipin IgG: <9 GPL U/mL (ref 0–14)
Anticardiolipin IgM: 16 MPL U/mL — ABNORMAL HIGH (ref 0–12)

## 2017-07-28 LAB — DRVVT MIX: DRVVT MIX: 54.6 s — AB (ref 0.0–47.0)

## 2017-07-28 LAB — BETA-2-GLYCOPROTEIN I ABS, IGG/M/A: Beta-2 Glyco I IgG: <9 GPI IgG units (ref 0–20)

## 2017-07-28 LAB — PROTEIN C, TOTAL: Protein C, Total: 94 % (ref 60–150)

## 2017-07-28 LAB — LUPUS ANTICOAGULANT PANEL
DRVVT: 77.5 s — ABNORMAL HIGH (ref 0.0–47.0)
PTT LA: 35.7 s (ref 0.0–51.9)

## 2017-07-28 LAB — DRVVT CONFIRM: dRVVT Confirm: 1.1 ratio (ref 0.8–1.2)

## 2017-07-29 ENCOUNTER — Other Ambulatory Visit: Payer: Self-pay | Admitting: Family

## 2017-07-29 DIAGNOSIS — I82402 Acute embolism and thrombosis of unspecified deep veins of left lower extremity: Secondary | ICD-10-CM

## 2017-07-30 LAB — FACTOR 5 LEIDEN

## 2017-07-30 LAB — PROTHROMBIN GENE MUTATION

## 2017-08-05 ENCOUNTER — Other Ambulatory Visit: Payer: Self-pay | Admitting: Internal Medicine

## 2017-08-05 ENCOUNTER — Ambulatory Visit (INDEPENDENT_AMBULATORY_CARE_PROVIDER_SITE_OTHER)
Admission: RE | Admit: 2017-08-05 | Discharge: 2017-08-05 | Disposition: A | Discharge status: Home / Self Care | Payer: Medicare HMO | Source: Ambulatory Visit | Attending: Internal Medicine | Admitting: Internal Medicine

## 2017-08-05 DIAGNOSIS — J189 Pneumonia, unspecified organism: Secondary | ICD-10-CM | POA: Diagnosis not present

## 2017-08-05 DIAGNOSIS — R05 Cough: Secondary | ICD-10-CM | POA: Diagnosis not present

## 2017-08-10 ENCOUNTER — Ambulatory Visit: Payer: Self-pay | Admitting: General Practice

## 2017-08-11 ENCOUNTER — Ambulatory Visit (INDEPENDENT_AMBULATORY_CARE_PROVIDER_SITE_OTHER)
Admission: RE | Admit: 2017-08-11 | Discharge: 2017-08-11 | Disposition: A | Discharge status: Home / Self Care | Payer: Medicare HMO | Source: Ambulatory Visit | Attending: Internal Medicine | Admitting: Internal Medicine

## 2017-08-11 DIAGNOSIS — J432 Centrilobular emphysema: Secondary | ICD-10-CM | POA: Diagnosis not present

## 2017-08-11 DIAGNOSIS — J189 Pneumonia, unspecified organism: Secondary | ICD-10-CM

## 2017-08-13 ENCOUNTER — Telehealth: Payer: Self-pay | Admitting: Internal Medicine

## 2017-08-13 NOTE — Telephone Encounter (Signed)
Check [place] registry last filled 05/03/2017.Marland KitchenJohny Chess

## 2017-08-13 NOTE — Telephone Encounter (Signed)
Hydrocodone-Acetaminophen refill LOV: 01/22/17 PCP: Dr Sharlet Salina Pharmacy: [address] Pt with trigeminal neuralgia flare

## 2017-08-13 NOTE — Telephone Encounter (Signed)
Copied from [place]. Topic: Quick Communication - Rx Refill/Question >> Aug 13, 2017  9:44 AM Synthia Innocent wrote: Medication:  HYDROcodone-acetaminophen (NORCO) 5-325 MG per tablet [[id number]]   Has the patient contacted their pharmacy? Yes, no refills available last filled 04/2017, this is for trigeminal neuralgia   (Agent: If no, request that the patient contact the pharmacy for the refill.)   Preferred Pharmacy (with phone number or street name): [facility] [address]   Agent: Please be advised that RX refills may take up to 3 business days. We ask that you follow-up with your pharmacy.

## 2017-08-13 NOTE — Telephone Encounter (Signed)
As stated before last time he requested this I have never prescribed this or seen him for this issue and cannot fill.

## 2017-08-16 NOTE — Telephone Encounter (Signed)
Notified pt wife with MD response, pt has appt w/MD on 3/21. Will discuss w/MD at that time.Marland KitchenJohny [name]

## 2017-09-02 ENCOUNTER — Other Ambulatory Visit: Payer: Self-pay | Admitting: Internal Medicine

## 2017-09-02 ENCOUNTER — Ambulatory Visit: Payer: Medicare HMO | Admitting: Internal Medicine

## 2017-09-03 NOTE — Telephone Encounter (Signed)
Routing to cindy/CC, please advise, thanks

## 2017-09-09 ENCOUNTER — Encounter: Payer: Self-pay | Admitting: Family Medicine

## 2017-09-09 ENCOUNTER — Ambulatory Visit: Payer: Medicare HMO | Admitting: Family Medicine

## 2017-09-09 DIAGNOSIS — M1712 Unilateral primary osteoarthritis, left knee: Secondary | ICD-10-CM

## 2017-09-09 NOTE — Progress Notes (Signed)
[name] [address] [place], [place] [address] Phone: [phone] Subjective:     CC: Left knee pain  Tanner [name]:[id number]  Tanner [name] is a 72 y.o. male coming in with complaint of left knee pain. His pain started in October but he had some other medical issues that too precedence over the knee pain. Patient has pain with standing and twisting. Pain is dull and achy.  Patient states some mild increase in instability again.  Wants to be walking.  Had significant amount of health issues including recent DVT.  Patient wants to be active and trying to get healthier but finds it difficult with the knee at the moment.      Past Medical History:  Diagnosis Date  . Acute kidney failure ([place])   . Arthritis   . Back pain   . BPH (benign prostatic hypertrophy)   . Clostridium difficile infection   . Clotting disorder ([place])   . Depression   . Diabetes ([place]) 12/11/2016  . DVT (deep venous thrombosis) ([place])   . Hypertension   . Low back pain potentially associated with spinal stenosis   . Neuromuscular disorder ([place])   . Neuropathy of lower extremity    bilateral  . OSA (obstructive sleep apnea)   . PE (pulmonary embolism)   . Ventral hernia    Past Surgical History:  Procedure Laterality Date  . EYE SURGERY    . HERNIA REPAIR     Social History   Socioeconomic History  . Marital status: Married    Spouse name: Not on file  . Number of children: 2  . Years of education: Not on file  . Highest education level: Not on file  Occupational History  . Occupation: Retired  Scientific laboratory technician  . Financial resource strain: Not on file  . Food insecurity:    Worry: Not on file    Inability: Not on file  . Transportation needs:    Medical: Not on file    Non-medical: Not on file  Tobacco Use  . Smoking status: Former Smoker    Packs/day: 2.00    Years: 33.00    Pack years: 66.00    Types: Cigarettes    Start date: 08/30/1968    Last attempt to quit:  07/02/2001    Years since quitting: 16.2  . Smokeless tobacco: Never Used  . Tobacco comment: quit 12 years ago  Substance and Sexual Activity  . Alcohol use: Yes    Comment: occasional  . Drug use: No  . Sexual activity: Not Currently  Lifestyle  . Physical activity:    Days per week: Not on file    Minutes per session: Not on file  . Stress: Not on file  Relationships  . Social connections:    Talks on phone: Not on file    Gets together: Not on file    Attends religious service: Not on file    Active member of club or organization: Not on file    Attends meetings of clubs or organizations: Not on file    Relationship status: Not on file  Other Topics Concern  . 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    Allergies  Allergen Reactions  . Codeine Sulfate Itching and  Nausea Only   Family History  Problem Relation Age of Onset  . Diabetes Mother   . Pneumonia Mother   . Alzheimer's disease Mother   . Other Father 36       Drowned on boating accident  . Colon cancer Neg Hx   . Colon polyps Neg Hx      Past medical history, social, surgical and family history all reviewed in electronic medical record.  No pertanent information unless stated regarding to the chief complaint.   Review of Systems:Review of systems updated and as accurate as of 09/09/17  No headache, visual changes, nausea, vomiting, diarrhea, constipation, dizziness, abdominal pain, skin rash, fevers, chills, night sweats, weight loss, swollen lymph nodes, , chest pain, shortness of breath, mood changes.  Positive muscle aches, joint swelling, lower extremity swelling  Objective  Blood pressure 122/64, pulse 80, weight 284 lb (128.8 kg), SpO2 93 %. Systems examined below as of 09/09/17   General: No apparent distress alert and oriented x3 mood and  affect normal, dressed appropriately.  Asymmetry of the face noted but is his baseline HEENT: Pupils equal, extraocular movements intact  Respiratory: Patient's speak in full sentences and does not appear short of breath  Cardiovascular: 2+lower extremity edema, mild tender, no erythema  Skin: Warm dry intact with no signs of infection or rash on extremities or on axial skeleton.  Abdomen: Soft nontender  Neuro: Cranial nerves II through XII are intact, neurovascularly intact in all extremities with 2+ DTRs and 2+ pulses.  Lymph: No lymphadenopathy of posterior or anterior cervical chain or axillae bilaterally.  Gait antalgic gait MSK:  Non tender with full range of motion and good stability and symmetric strength and tone of shoulders, elbows, wrist, hip and ankles bilaterally.  Knee: Left knee valgus deformity noted. Large thigh to calf ratio.  Tender to palpation over medial and PF joint line.  ROM full in flexion and extension and lower leg rotation. instability with valgus force.  painful patellar compression. Patellar glide with moderate crepitus. Patellar and quadriceps tendons unremarkable. Hamstring and quadriceps strength is normal. Contralateral knee shows show some mild arthritic changes as well.    Impression and Recommendations:     This case required medical decision making of moderate complexity.      Note: This dictation was prepared with Dragon dictation along with smaller phrase technology. Any transcriptional errors that result from this process are unintentional.

## 2017-09-09 NOTE — Patient Instructions (Addendum)
Good to see you  Tanner [name] is your friend. Ice 20 minutes 2 times daily. Usually after activity and before bed. pennsaid pinkie amount topically 2 times daily as needed.  See me again in 2-3 weeks. Will start Orthovisc at that time

## 2017-09-09 NOTE — Assessment & Plan Note (Signed)
Worsening pain.  Discussed icing regimen, given steroid injection today.  We will see if we get approval for Visco supplementation the patient responded very well to it previously.  Patient will follow up with me again in 2-3 weeks

## 2017-09-15 ENCOUNTER — Other Ambulatory Visit (INDEPENDENT_AMBULATORY_CARE_PROVIDER_SITE_OTHER): Payer: Medicare HMO

## 2017-09-15 ENCOUNTER — Encounter: Payer: Self-pay | Admitting: Internal Medicine

## 2017-09-15 ENCOUNTER — Ambulatory Visit (INDEPENDENT_AMBULATORY_CARE_PROVIDER_SITE_OTHER): Payer: Medicare HMO | Admitting: Internal Medicine

## 2017-09-15 VITALS — BP 120/70 | HR 75 | Temp 98.5°F | Ht 67.0 in | Wt 279.0 lb

## 2017-09-15 DIAGNOSIS — I10 Essential (primary) hypertension: Secondary | ICD-10-CM | POA: Diagnosis not present

## 2017-09-15 DIAGNOSIS — E1142 Type 2 diabetes mellitus with diabetic polyneuropathy: Secondary | ICD-10-CM | POA: Diagnosis not present

## 2017-09-15 DIAGNOSIS — Z1159 Encounter for screening for other viral diseases: Secondary | ICD-10-CM | POA: Diagnosis not present

## 2017-09-15 DIAGNOSIS — F325 Major depressive disorder, single episode, in full remission: Secondary | ICD-10-CM | POA: Diagnosis not present

## 2017-09-15 DIAGNOSIS — R69 Illness, unspecified: Secondary | ICD-10-CM | POA: Diagnosis not present

## 2017-09-15 LAB — COMPREHENSIVE METABOLIC PANEL
ALK PHOS: 89 U/L (ref 39–117)
ALT: 31 U/L (ref 0–53)
AST: 21 U/L (ref 0–37)
Albumin: 3.9 g/dL (ref 3.5–5.2)
BUN: 22 mg/dL (ref 6–23)
CO2: 35 mEq/L — ABNORMAL HIGH (ref 19–32)
Calcium: 9.5 mg/dL (ref 8.4–10.5)
Chloride: 97 mEq/L (ref 96–112)
Creatinine, Ser: 1.19 mg/dL (ref 0.40–1.50)
GFR: 63.71 mL/min (ref >60.00)
GLUCOSE: 87 mg/dL (ref 70–99)
POTASSIUM: 4 meq/L (ref 3.5–5.1)
Sodium: 140 mEq/L (ref 135–145)
TOTAL PROTEIN: 6.6 g/dL (ref 6.0–8.3)
Total Bilirubin: 0.7 mg/dL (ref 0.2–1.2)

## 2017-09-15 LAB — CBC
HEMATOCRIT: 48.3 % (ref 39.0–52.0)
Hemoglobin: 16 g/dL (ref 13.0–17.0)
MCHC: 33.2 g/dL (ref 30.0–36.0)
MCV: 87.3 fl (ref 78.0–100.0)
Platelets: 202 10*3/uL (ref 150.0–400.0)
RBC: 5.53 Mil/uL (ref 4.22–5.81)
RDW: 16.2 % — AB (ref 11.5–15.5)
WBC: 12.2 10*3/uL — AB (ref 4.0–10.5)

## 2017-09-15 LAB — HEMOGLOBIN A1C: Hgb A1c MFr Bld: 6.2 % (ref 4.6–6.5)

## 2017-09-15 NOTE — Assessment & Plan Note (Signed)
Will try to titrate off per his request. Will take 1/2 pill daily for 1 month then off if stable. If not stable resume current dosing.

## 2017-09-15 NOTE — Assessment & Plan Note (Signed)
Will check HgA1c and if <7 will stop metformin. We talked about how his goal is <8 and he can work on diet to help. Given information about diet. Foot exam done. Eye exam current.

## 2017-09-15 NOTE — Progress Notes (Signed)
   Subjective:    Patient ID: Tanner [name], male    DOB: Oct 29, 1945, 72 y.o.   MRN: [id number]  HPI The patient is a 72 YO man coming in for follow up of his diabetes (taking metformin currently, highest HgA1c 7.1 without meds, most recent on metformin 6.3, would like to eliminate meds if possible, does have chronic numbness in his feet, denies new sores or cuts on feet, checks them, [facility] exam [facility]), and his blood pressure (taking atenolol and hctz and lasix, denies swelling except rare, does not eat low sodium diet), and morbid obesity (complicated by blood clot, htn, diabetes, denies changes to diet or exercise lately but would like to lose weight).   Review of Systems  Constitutional: Positive for fatigue.  HENT: Negative.   Eyes: Negative.   Respiratory: Positive for shortness of breath. Negative for cough and chest tightness.   Cardiovascular: Negative for chest pain, palpitations and leg swelling.  Gastrointestinal: Negative for abdominal distention, abdominal pain, constipation, diarrhea, nausea and vomiting.  Musculoskeletal: Negative.   Skin: Negative.   Neurological: Positive for numbness.  Psychiatric/Behavioral: Negative.       Objective:   Physical Exam  Constitutional: He is oriented to person, place, and time. He appears well-developed and well-nourished.  HENT:  Head: Normocephalic and atraumatic.  Eyes: EOM are normal.  Neck: Normal range of motion.  Cardiovascular: Normal rate and regular rhythm.  Pulmonary/Chest: Effort normal and breath sounds normal. No respiratory distress. He has no wheezes. He has no rales.  Abdominal: Soft. Bowel sounds are normal. He exhibits no distension. There is no tenderness. There is no rebound.  Musculoskeletal: He exhibits no edema.  Neurological: He is alert and oriented to person, place, and time. Coordination normal.  Skin: Skin is warm and dry.  Foot exam done  Psychiatric: He has a normal mood and affect.   Vitals:    09/15/17 1111  BP: 120/70  Pulse: 75  Temp: 98.5 F (36.9 C)  TempSrc: Oral  SpO2: 95%  Weight: 279 lb (126.6 kg)  Height: 5\' 7"  (1.702 m)      Assessment & Plan:

## 2017-09-15 NOTE — Assessment & Plan Note (Signed)
Will stop lasix and continue hctz and atenolol. Checking CMP today and adjust as needed.

## 2017-09-15 NOTE — Assessment & Plan Note (Signed)
Given some dietary information for his diabetes. He will work on changes to diet. He is aware that his weight is causing problems with diabetes, arthritis.

## 2017-09-15 NOTE — Patient Instructions (Addendum)
We will check the labs today and stop the metformin if we can.  We will have you cut back on the wellbutrin to 1/2 pill daily for 1 month. If you start having more problems go back to 1 pill daily. If you do not have any problems stop taking.   Stop doing the lasix (furosemide) daily. This is for swelling so only take it if you are swelling in legs worse.    Diabetes Mellitus and Nutrition When you have diabetes (diabetes mellitus), it is very important to have healthy eating habits because your blood sugar (glucose) levels are greatly affected by what you eat and drink. Eating healthy foods in the appropriate amounts, at about the same times every day, can help you:  Control your blood glucose.  Lower your risk of heart disease.  Improve your blood pressure.  Reach or maintain a healthy weight.  Every person with diabetes is different, and each person has different needs for a meal plan. Your health care provider may recommend that you work with a diet and nutrition specialist (dietitian) to make a meal plan that is best for you. Your meal plan may vary depending on factors such as:  The calories you need.  The medicines you take.  Your weight.  Your blood glucose, blood pressure, and cholesterol levels.  Your activity level.  Other health conditions you have, such as heart or kidney disease.  How do carbohydrates affect me? Carbohydrates affect your blood glucose level more than any other type of food. Eating carbohydrates naturally increases the amount of glucose in your blood. Carbohydrate counting is a method for keeping track of how many carbohydrates you eat. Counting carbohydrates is important to keep your blood glucose at a healthy level, especially if you use insulin or take certain oral diabetes medicines. It is important to know how many carbohydrates you can safely have in each meal. This is different for every person. Your dietitian can help you calculate how many  carbohydrates you should have at each meal and for snack. Foods that contain carbohydrates include:  Bread, cereal, rice, pasta, and crackers.  Potatoes and corn.  Peas, beans, and lentils.  Milk and yogurt.  Fruit and juice.  Desserts, such as cakes, cookies, ice cream, and candy.  How does alcohol affect me? Alcohol can cause a sudden decrease in blood glucose (hypoglycemia), especially if you use insulin or take certain oral diabetes medicines. Hypoglycemia can be a life-threatening condition. Symptoms of hypoglycemia (sleepiness, dizziness, and confusion) are similar to symptoms of having too much alcohol. If your health care provider says that alcohol is safe for you, follow these guidelines:  Limit alcohol intake to no more than 1 drink per day for nonpregnant women and 2 drinks per day for men. One drink equals 12 oz of beer, 5 oz of wine, or 1 oz of hard liquor.  Do not drink on an empty stomach.  Keep yourself hydrated with water, diet soda, or unsweetened iced tea.  Keep in mind that regular soda, juice, and other mixers may contain a lot of sugar and must be counted as carbohydrates.  What are tips for following this plan? Reading food labels  Start by checking the serving size on the label. The amount of calories, carbohydrates, fats, and other nutrients listed on the label are based on one serving of the food. Many foods contain more than one serving per package.  Check the total grams (g) of carbohydrates in one serving. You can  calculate the number of servings of carbohydrates in one serving by dividing the total carbohydrates by 15. For example, if a food has 30 g of total carbohydrates, it would be equal to 2 servings of carbohydrates.  Check the number of grams (g) of saturated and trans fats in one serving. Choose foods that have low or no amount of these fats.  Check the number of milligrams (mg) of sodium in one serving. Most people should limit total sodium  intake to less than 2,300 mg per day.  Always check the nutrition information of foods labeled as "low-fat" or "nonfat". These foods may be higher in added sugar or refined carbohydrates and should be avoided.  Talk to your dietitian to identify your daily goals for nutrients listed on the label. Shopping  Avoid buying canned, premade, or processed foods. These foods tend to be high in fat, sodium, and added sugar.  Shop around the outside edge of the grocery store. This includes fresh fruits and vegetables, bulk grains, fresh meats, and fresh dairy. Cooking  Use low-heat cooking methods, such as baking, instead of high-heat cooking methods like deep frying.  Cook using healthy oils, such as olive, canola, or sunflower oil.  Avoid cooking with butter, cream, or high-fat meats. Meal planning  Eat meals and snacks regularly, preferably at the same times every day. Avoid going long periods of time without eating.  Eat foods high in fiber, such as fresh fruits, vegetables, beans, and whole grains. Talk to your dietitian about how many servings of carbohydrates you can eat at each meal.  Eat 4-6 ounces of lean protein each day, such as lean meat, chicken, fish, eggs, or tofu. 1 ounce is equal to 1 ounce of meat, chicken, or fish, 1 egg, or 1/4 cup of tofu.  Eat some foods each day that contain healthy fats, such as avocado, nuts, seeds, and fish. Lifestyle   Check your blood glucose regularly.  Exercise at least 30 minutes 5 or more days each week, or as told by your health care provider.  Take medicines as told by your health care provider.  Do not use any products that contain nicotine or tobacco, such as cigarettes and e-cigarettes. If you need help quitting, ask your health care provider.  Work with a Social worker or diabetes educator to identify strategies to manage stress and any emotional and social challenges. What are some questions to ask my health care provider?  Do I need  to meet with a diabetes educator?  Do I need to meet with a dietitian?  What number can I call if I have questions?  When are the best times to check my blood glucose? Where to find more information:  American Diabetes Association: diabetes.org/food-and-fitness/food  Academy of Nutrition and Dietetics: PokerClues.dk  [employer] of Diabetes and Digestive and Kidney Diseases (NIH): ContactWire.be Summary  A healthy meal plan will help you control your blood glucose and maintain a healthy lifestyle.  Working with a diet and nutrition specialist (dietitian) can help you make a meal plan that is best for you.  Keep in mind that carbohydrates and alcohol have immediate effects on your blood glucose levels. It is important to count carbohydrates and to use alcohol carefully. This information is not intended to replace advice given to you by your health care provider. Make sure you discuss any questions you have with your health care provider. Document Released: 02/26/2005 Document Revised: 07/06/2016 Document Reviewed: 07/06/2016 Elsevier Interactive Patient Education  [employer].

## 2017-09-16 LAB — HEPATITIS C ANTIBODY
HEP C AB: NONREACTIVE
SIGNAL TO CUT-OFF: 0.02 (ref <1.00)

## 2017-09-21 ENCOUNTER — Other Ambulatory Visit: Payer: Self-pay | Admitting: Neurology

## 2017-09-23 ENCOUNTER — Ambulatory Visit (HOSPITAL_BASED_OUTPATIENT_CLINIC_OR_DEPARTMENT_OTHER)
Admission: RE | Admit: 2017-09-23 | Discharge: 2017-09-23 | Disposition: A | Discharge status: Home / Self Care | Payer: Medicare HMO | Source: Ambulatory Visit | Attending: Hematology & Oncology | Admitting: Hematology & Oncology

## 2017-09-23 ENCOUNTER — Inpatient Hospital Stay: Payer: Medicare HMO | Attending: Hematology & Oncology | Admitting: Family

## 2017-09-23 ENCOUNTER — Other Ambulatory Visit: Payer: Self-pay

## 2017-09-23 ENCOUNTER — Encounter: Payer: Self-pay | Admitting: Family

## 2017-09-23 ENCOUNTER — Inpatient Hospital Stay: Payer: Medicare HMO

## 2017-09-23 VITALS — BP 128/53 | HR 75 | Temp 98.0°F | Resp 20 | Wt 280.0 lb

## 2017-09-23 DIAGNOSIS — I82402 Acute embolism and thrombosis of unspecified deep veins of left lower extremity: Secondary | ICD-10-CM

## 2017-09-23 DIAGNOSIS — J439 Emphysema, unspecified: Secondary | ICD-10-CM | POA: Diagnosis not present

## 2017-09-23 DIAGNOSIS — I82512 Chronic embolism and thrombosis of left femoral vein: Secondary | ICD-10-CM | POA: Insufficient documentation

## 2017-09-23 DIAGNOSIS — I82532 Chronic embolism and thrombosis of left popliteal vein: Secondary | ICD-10-CM | POA: Diagnosis not present

## 2017-09-23 DIAGNOSIS — Z7901 Long term (current) use of anticoagulants: Secondary | ICD-10-CM | POA: Insufficient documentation

## 2017-09-23 DIAGNOSIS — I2699 Other pulmonary embolism without acute cor pulmonale: Secondary | ICD-10-CM | POA: Diagnosis present

## 2017-09-23 DIAGNOSIS — Z86711 Personal history of pulmonary embolism: Secondary | ICD-10-CM | POA: Insufficient documentation

## 2017-09-23 DIAGNOSIS — I700 Atherosclerosis of aorta: Secondary | ICD-10-CM | POA: Diagnosis not present

## 2017-09-23 LAB — CBC WITH DIFFERENTIAL (CANCER CENTER ONLY)
BASOS ABS: 0 10*3/uL (ref 0.0–0.1)
Basophils Relative: 0 %
Eosinophils Absolute: 0.4 10*3/uL (ref 0.0–0.5)
Eosinophils Relative: 4 %
HEMATOCRIT: 46.6 % (ref 38.7–49.9)
HEMOGLOBIN: 15.8 g/dL (ref 13.0–17.1)
LYMPHS PCT: 27 %
Lymphs Abs: 2.6 10*3/uL (ref 0.9–3.3)
MCH: 29.9 pg (ref 28.0–33.4)
MCHC: 33.9 g/dL (ref 32.0–35.9)
MCV: 88.3 fL (ref 82.0–98.0)
MONO ABS: 0.6 10*3/uL (ref 0.1–0.9)
Monocytes Relative: 7 %
NEUTROS ABS: 5.7 10*3/uL (ref 1.5–6.5)
Neutrophils Relative %: 62 %
Platelet Count: 174 10*3/uL (ref 145–400)
RBC: 5.28 MIL/uL (ref 4.20–5.70)
RDW: 15 % (ref 11.1–15.7)
WBC Count: 9.3 10*3/uL (ref 4.0–10.0)

## 2017-09-23 LAB — CMP (CANCER CENTER ONLY)
ALT: 28 U/L (ref 10–47)
ANION GAP: 6 (ref 5–15)
AST: 20 U/L (ref 11–38)
Albumin: 3.7 g/dL (ref 3.5–5.0)
Alkaline Phosphatase: 90 U/L — ABNORMAL HIGH (ref 26–84)
BILIRUBIN TOTAL: 0.8 mg/dL (ref 0.2–1.6)
BUN: 17 mg/dL (ref 7–22)
CALCIUM: 9.2 mg/dL (ref 8.0–10.3)
CO2: 35 mmol/L — ABNORMAL HIGH (ref 18–33)
Chloride: 99 mmol/L (ref 98–108)
Creatinine: 1.3 mg/dL — ABNORMAL HIGH (ref 0.60–1.20)
Glucose, Bld: 146 mg/dL — ABNORMAL HIGH (ref 73–118)
Potassium: 3.1 mmol/L — ABNORMAL LOW (ref 3.3–4.7)
Sodium: 140 mmol/L (ref 128–145)
TOTAL PROTEIN: 6.5 g/dL (ref 6.4–8.1)

## 2017-09-23 MED ORDER — IOPAMIDOL (ISOVUE-370) INJECTION 76%
100.0000 mL | Freq: Once | INTRAVENOUS | Status: AC | PRN
  Administered 2017-09-23: 100 mL via INTRAVENOUS

## 2017-09-23 NOTE — Progress Notes (Signed)
Hematology and Oncology Follow Up Visit  ALDYN TOON [id number] January 01, 1946 72 y.o. 09/23/2017   Principle Diagnosis:  Recurrent pulmonary emboli/left leg thrombus  Current Therapy:   Eliquis 5 mg p.o. twice daily - started 06/29/2017   Interim History: Mr. Brandis is here today for follow-up. He is doing well on Eliquis 5 mg PO BID. He states that he bruises easily but has had no issue with bleeding.  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  CT angio showed resolution of pulmonary emboli.  He has occasional SOB with over exertion and will take breaks to rest as needed.  No fever, chills, n/v, cough, rash, dizziness, chest pain, palpitations, abdominal pain or changes in bowel or bladder habits.  He states that the puffiness in his ankles comes and goes. He takes lasix PRN to help control this.  The neuropathy in his feet is unchanged.  No lymphadenopathy found on exam.  He has maintained a good appetite and is staying well hydrated. His weight is stable.   ECOG Performance Status: 1 - Symptomatic but completely ambulatory  Medications:  Allergies as of 09/23/2017      Reactions   Codeine Sulfate Itching, Nausea Only      Medication List        Accurate as of 09/23/17 11:43 AM. Always use your most recent med list.          acetaminophen 500 MG tablet Commonly known as:  TYLENOL Take 1,000 mg by mouth as needed.   apixaban 5 MG Tabs tablet Commonly known as:  ELIQUIS Take 1 tablet (5 mg total) by mouth 2 (two) times daily.   atenolol 25 MG tablet Commonly known as:  TENORMIN Take 12.5 mg by mouth daily.   buPROPion 150 MG 12 hr tablet Commonly known as:  WELLBUTRIN SR Take 150 mg by mouth 2 (two) times daily.   finasteride 5 MG tablet Commonly known as:  PROSCAR Take 5 mg by mouth at bedtime.   gabapentin 300 MG  capsule Commonly known as:  NEURONTIN 600 mg. Take 2 tablets three daily AND 300 MG AT BEDTIME   hydrochlorothiazide 25 MG tablet Commonly known as:  HYDRODIURIL Take 25 mg by mouth daily.   ketoconazole 2 % shampoo Commonly known as:  NIZORAL Apply topically daily.   metFORMIN 500 MG 24 hr tablet Commonly known as:  GLUCOPHAGE-XR Take 1 tablet (500 mg total) by mouth daily with breakfast.   multivitamins ther. w/minerals Tabs tablet Take 1 tablet by mouth daily.   nortriptyline 10 MG capsule Commonly known as:  PAMELOR TAKE 1 CAPSULE BY MOUTH EVERY NIGHT AT BEDTIME FOR 2 WEEKS, THEN INCREASE TO 2 CAPSULES BY MOUTH EVERY NIGHT AT BEDTIME   saccharomyces boulardii 250 MG capsule Commonly known as:  FLORASTOR Take 1 capsule (250 mg total) by mouth 2 (two) times daily.   THERA-GESIC PLUS Crea Apply 1 application topically at bedtime. Apply to lower back   traMADol 50 MG tablet Commonly known as:  ULTRAM Take 100 mg by mouth 3 (three) times daily as needed.   zolpidem 10 MG tablet Commonly known as:  AMBIEN Take 1 tablet (10 mg total) by mouth at bedtime. For sleep       Allergies:  Allergies  Allergen Reactions  . Codeine Sulfate Itching and Nausea Only    Past  Medical History, Surgical history, Social history, and Family History were reviewed and updated.  Review of Systems: All other 10 point review of systems is negative.   Physical Exam:  weight is 280 lb (127 kg). His oral temperature is 98 F (36.7 C). His blood pressure is 128/53 (abnormal) and his pulse is 75. His respiration is 20 and oxygen saturation is 93%.   Wt Readings from Last 3 Encounters:  09/23/17 280 lb (127 kg)  09/15/17 279 lb (126.6 kg)  09/09/17 284 lb (128.8 kg)    Ocular: Sclerae unicteric, pupils equal, round and reactive to light Ear-nose-throat: Oropharynx clear, dentition fair Lymphatic: No cervical, supraclavicular or axillary adenopathy Lungs no rales or rhonchi, good  excursion bilaterally Heart regular rate and rhythm, no murmur appreciated Abd soft, nontender, positive bowel sounds, no liver or spleen tip palpated on exam, no fluid wave  MSK no focal spinal tenderness, no joint edema Neuro: non-focal, well-oriented, appropriate affect Breasts: Deferred   Lab Results  Component Value Date   WBC 9.3 09/23/2017   HGB 16.0 09/15/2017   HCT 46.6 09/23/2017   MCV 88.3 09/23/2017   PLT 174 09/23/2017   No results found for: FERRITIN, IRON, TIBC, UIBC, IRONPCTSAT Lab Results  Component Value Date   RBC 5.28 09/23/2017   No results found for: KPAFRELGTCHN, LAMBDASER, [facility] Lab Results  Component Value Date   IGGSERUM 549 (L) 04/07/2017   IGMSERUM 144 04/07/2017   Lab Results  Component Value Date   ALBUMINELP 3.7 (L) 04/07/2017   BETS 0.5 04/07/2017   BETA2SER 0.3 04/07/2017   GAMS 0.6 (L) 04/07/2017   SPEI  04/07/2017     Comment:     . One or more serum protein fractions are outside the normal ranges.  No abnormal protein bands are apparent. .      Chemistry      Component Value Date/Time   NA 140 09/23/2017 0935   NA 140 11/07/2014   NA 136 12/29/2013 1356   K 3.1 (L) 09/23/2017 0935   K 3.4 12/29/2013 1356   CL 99 09/23/2017 0935   CL 97 (L) 12/29/2013 1356   CO2 35 (H) 09/23/2017 0935   CO2 33 12/29/2013 1356   BUN 17 09/23/2017 0935   BUN 18 11/07/2014   BUN 15 12/29/2013 1356   CREATININE 1.30 (H) 09/23/2017 0935   CREATININE 1.2 12/29/2013 1356   GLU 84 11/07/2014      Component Value Date/Time   CALCIUM 9.2 09/23/2017 0935   CALCIUM 8.9 12/29/2013 1356   ALKPHOS 90 (H) 09/23/2017 0935   ALKPHOS 59 12/29/2013 1356   AST 20 09/23/2017 0935   ALT 28 09/23/2017 0935   ALT 27 12/29/2013 1356   BILITOT 0.8 09/23/2017 0935      Impression and Plan: Mr. Kallenbach is a very pleasant 71 yo caucasian gentleman with recurrent thrombus. He is doing well on Eliquis. US showed chronic nonocclusive thrombus of the femoral  and popliteal veins. CT angio showed complete resolution of pulmonary emboli.  He will continue his same regimen.  We will plan to see him back in another 3 months for follow-up.  He will contact our office with any questions or concerns. We can certainly see him sooner if need be.   Laverna Peace, NP 4/11/201911:43 AM

## 2017-09-24 LAB — HOMOCYSTEINE: HOMOCYSTEINE-NORM: 8.7 umol/L (ref 0.0–15.0)

## 2017-09-27 ENCOUNTER — Telehealth: Payer: Self-pay | Admitting: *Deleted

## 2017-09-27 NOTE — Telephone Encounter (Addendum)
Patient is aware of results  ----- Message from Volanda Napoleon, MD sent at 09/26/2017  8:28 PM EDT ----- Call - NO blood clot in the lung!!  Lattie Haw, Rudell Cobb, MD  P Onc Nurse [facility]        Call - No growth of the blood clot. Everything is stable!! pete

## 2017-10-20 DIAGNOSIS — R0789 Other chest pain: Secondary | ICD-10-CM | POA: Insufficient documentation

## 2017-11-09 ENCOUNTER — Ambulatory Visit: Payer: Medicare HMO | Admitting: Family Medicine

## 2017-11-09 NOTE — Progress Notes (Signed)
[name] [address], [place] [address] Phone: [phone] Subjective:     CC: Left knee pain follow-up  MPN:[id number]  Tanner [name] is a 72 y.o. male coming in with complaint of left knee pain. He had a decrease in pain for 6-8 weeks. He said that the pain did come back but he has be en using ice to keep his pain down.  Waking him up at night.  Affecting daily activities.       Past Medical History:  Diagnosis Date  . Acute kidney failure ([place])   . Arthritis   . Back pain   . BPH (benign prostatic hypertrophy)   . Clostridium difficile infection   . Clotting disorder ([place])   . Depression   . Diabetes ([place]) 12/11/2016  . DVT (deep venous thrombosis) ([place])   . Hypertension   . Low back pain potentially associated with spinal stenosis   . Neuromuscular disorder ([place])   . Neuropathy of lower extremity    bilateral  . OSA (obstructive sleep apnea)   . PE (pulmonary embolism)   . Ventral hernia    Past Surgical History:  Procedure Laterality Date  . EYE SURGERY    . HERNIA REPAIR     Social History   Socioeconomic History  . Marital status: Married    Spouse name: Not on file  . Number of children: 2  . Years of education: Not on file  . Highest education level: Not on file  Occupational History  . Occupation: Retired  Scientific laboratory technician  . Financial resource strain: Not on file  . Food insecurity:    Worry: Not on file    Inability: Not on file  . Transportation needs:    Medical: Not on file    Non-medical: Not on file  Tobacco Use  . Smoking status: Former Smoker    Packs/day: 2.00    Years: 33.00    Pack years: 66.00    Types: Cigarettes    Start date: 08/30/1968    Last attempt to quit: 07/02/2001    Years since quitting: 16.3  . Smokeless tobacco: Never Used  . Tobacco comment: quit 12 years ago  Substance and Sexual Activity  . Alcohol use: Yes    Comment: occasional  . Drug use: No  . Sexual activity: Not  Currently  Lifestyle  . Physical activity:    Days per week: Not on file    Minutes per session: Not on file  . Stress: Not on file  Relationships  . Social connections:    Talks on phone: Not on file    Gets together: Not on file    Attends religious service: Not on file    Active member of club or organization: Not on file    Attends meetings of clubs or organizations: Not on file    Relationship status: Not on file  Other Topics Concern  . 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    Allergies  Allergen Reactions  . Codeine Sulfate Itching and Nausea Only   Family History  Problem Relation Age of Onset  . Diabetes Mother   . Pneumonia Mother   . Alzheimer's disease Mother   . Other Father 43  Drowned on boating accident  . Colon cancer Neg Hx   . Colon polyps Neg Hx      Past medical history, social, surgical and family history all reviewed in electronic medical record.  No pertanent information unless stated regarding to the chief complaint.   Review of Systems:Review of systems updated and as accurate as of 11/10/17  No headache, visual changes, nausea, vomiting, diarrhea, constipation, dizziness, abdominal pain, skin rash, fevers, chills, night sweats, weight loss, swollen lymph nodes, body aches, joint swelling,  chest pain, shortness of breath, mood changes.  Positive muscle aches  Objective  Blood pressure (!) 100/50, pulse 65, height 5\' 7"  (1.702 m), weight 280 lb (127 kg), SpO2 96 %. Systems examined below as of 11/10/17   General: No apparent distress alert and oriented x3 mood and affect normal, dressed appropriately.  Asymmetry of the face HEENT: Pupils equal, extraocular movements intact  Respiratory: Patient's speak in full sentences and does not appear short of breath  Cardiovascular: No lower  extremity edema, non tender, no erythema  Skin: Warm dry intact with no signs of infection or rash on extremities or on axial skeleton.  Abdomen: Soft nontender  Neuro: Cranial nerves II through XII are intact, neurovascularly intact in all extremities with 2+ DTRs and 2+ pulses.  Lymph: No lymphadenopathy of posterior or anterior cervical chain or axillae bilaterally.  Gait normal with good balance and coordination.  MSK:  Non tender with full range of motion and good stability and symmetric strength and tone of shoulders, elbows, wrist, hip, and ankles bilaterally.  Knee: left valgus deformity noted. Large thigh to calf ratio.  Tender to palpation over medial and PF joint line.  ROM full in flexion and extension and lower leg rotation. instability with valgus force.  painful patellar compression. Patellar glide with moderate crepitus. Patellar and quadriceps tendons unremarkable. Hamstring and quadriceps strength is normal. Contralateral knee shows mild arthritic changes  After informed written and verbal consent, patient was seated on exam table. Left knee was prepped with alcohol swab and utilizing anterolateral approach, patient's left knee space was injected with15 mg/2.5 mL of Orthovisc(sodium hyaluronate) in a prefilled syringe was injected easily into the knee through a 22-gauge needle..Patient tolerated the procedure well without immediate complications.    Impression and Recommendations:     This case required medical decision making of moderate complexity.      Note: This dictation was prepared with Dragon dictation along with smaller phrase technology. Any transcriptional errors that result from this process are unintentional.

## 2017-11-10 ENCOUNTER — Encounter

## 2017-11-10 ENCOUNTER — Ambulatory Visit (INDEPENDENT_AMBULATORY_CARE_PROVIDER_SITE_OTHER): Payer: Medicare HMO | Admitting: Family Medicine

## 2017-11-10 DIAGNOSIS — M1712 Unilateral primary osteoarthritis, left knee: Secondary | ICD-10-CM

## 2017-11-10 NOTE — Patient Instructions (Signed)
Good to see you  You know the drill  Ice is your friend See you again real soon

## 2017-11-10 NOTE — Assessment & Plan Note (Signed)
Started [place] supplementation again.  Discussed icing regimen and home exercise.  Discussed which activities to do which wants to avoid.  Patient is to increase activity as tolerated.  Follow-up again 1 weeks.

## 2017-11-16 ENCOUNTER — Encounter: Payer: Self-pay | Admitting: Family Medicine

## 2017-11-16 ENCOUNTER — Ambulatory Visit (INDEPENDENT_AMBULATORY_CARE_PROVIDER_SITE_OTHER): Payer: Medicare HMO | Admitting: Family Medicine

## 2017-11-16 DIAGNOSIS — M1712 Unilateral primary osteoarthritis, left knee: Secondary | ICD-10-CM | POA: Diagnosis not present

## 2017-11-16 NOTE — Progress Notes (Signed)
[name] [address] [place], [place] [address] Phone: [phone] Subjective:     CC: Knee pain  [id number]  Tanner [name] is a 72 y.o. male coming in with complaint of left knee pain. He has already been feeling improvement from the first injection. He has not had any pain this week.  Patient has been doing reasonably well after the first injection     Past Medical History:  Diagnosis Date  . Acute kidney failure ([place])   . Arthritis   . Back pain   . BPH (benign prostatic hypertrophy)   . Clostridium difficile infection   . Clotting disorder ([place])   . Depression   . Diabetes ([place]) 12/11/2016  . DVT (deep venous thrombosis) ([place])   . Hypertension   . Low back pain potentially associated with spinal stenosis   . Neuromuscular disorder ([place])   . Neuropathy of lower extremity    bilateral  . OSA (obstructive sleep apnea)   . PE (pulmonary embolism)   . Ventral hernia    Past Surgical History:  Procedure Laterality Date  . EYE SURGERY    . HERNIA REPAIR     Social History   Socioeconomic History  . Marital status: Married    Spouse name: Not on file  . Number of children: 2  . Years of education: Not on file  . Highest education level: Not on file  Occupational History  . Occupation: Retired  Scientific laboratory technician  . Financial resource strain: Not on file  . Food insecurity:    Worry: Not on file    Inability: Not on file  . Transportation needs:    Medical: Not on file    Non-medical: Not on file  Tobacco Use  . Smoking status: Former Smoker    Packs/day: 2.00    Years: 33.00    Pack years: 66.00    Types: Cigarettes    Start date: 08/30/1968    Last attempt to quit: 07/02/2001    Years since quitting: 16.3  . Smokeless tobacco: Never Used  . Tobacco comment: quit 12 years ago  Substance and Sexual Activity  . Alcohol use: Yes    Comment: occasional  . Drug use: No  . Sexual activity: Not Currently  Lifestyle  .  Physical activity:    Days per week: Not on file    Minutes per session: Not on file  . Stress: Not on file  Relationships  . Social connections:    Talks on phone: Not on file    Gets together: Not on file    Attends religious service: Not on file    Active member of club or organization: Not on file    Attends meetings of clubs or organizations: Not on file    Relationship status: Not on file  Other Topics Concern  . 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    Allergies  Allergen Reactions  . Codeine Sulfate Itching and Nausea Only   Family History  Problem Relation Age of Onset  . Diabetes Mother   . Pneumonia Mother   . Alzheimer's disease Mother   . Other Father 18       Drowned on boating accident  . Colon cancer Neg Hx   .  Colon polyps Neg Hx      Past medical history, social, surgical and family history all reviewed in electronic medical record.  No pertanent information unless stated regarding to the chief complaint.   Review of Systems:Review of systems updated and as accurate as of 11/16/17  No headache, visual changes, nausea, vomiting, diarrhea, constipation, dizziness, abdominal pain, skin rash, fevers, chills, night sweats, weight loss, swollen lymph nodes, body aches, joint swelling, muscle aches, chest pain, shortness of breath, mood changes.   Objective  Blood pressure 126/66, pulse 78, height 5\' 7"  (1.702 m), weight 287 lb (130.2 kg), SpO2 91 %. Systems examined below as of 11/16/17   General: No apparent distress alert and oriented x3 mood and affect normal, dressed appropriately.   Gait mild antalgic MSK:  Non tender with full range of motion and good stability and symmetric strength and tone of shoulders, elbows, wrist, hip, and ankles bilaterally.  Mild arthritic changes of multiple  joints  Knee: Left valgus deformity noted. Large thigh to calf ratio.  Tender to palpation over medial and PF joint line.  ROM full in flexion and extension and lower leg rotation. instability with valgus force.  painful patellar compression. Patellar glide with moderate crepitus. Patellar and quadriceps tendons unremarkable. Hamstring and quadriceps strength is normal. Contralateral knee shows mild arthritic changes  After informed written and verbal consent, patient was seated on exam table. Left knee was prepped with alcohol swab and utilizing anterolateral approach, patient's left knee space was injected with15 mg/2.5 mL of Orthovisc(sodium hyaluronate) in a prefilled syringe was injected easily into the knee through a 22-gauge needle..Patient tolerated the procedure well without immediate complications.    Impression and Recommendations:     This case required medical decision making of moderate complexity.      Note: This dictation was prepared with Dragon dictation along with smaller phrase technology. Any transcriptional errors that result from this process are unintentional.

## 2017-11-16 NOTE — Assessment & Plan Note (Signed)
Patient responded well to Visco supplementation.  To in a series of 4 injections done today.  Discussed icing regimen.  Follow-up again 1 week for third in a series of 4 injections.

## 2017-11-22 NOTE — Progress Notes (Signed)
[name] [address] [place], [place] [address] Phone: [phone] Subjective:     CC: Knee pain  YQI:[id number]  Tanner [name] is a 72 y.o. male coming in with complaint of left knee pain. He is here for the 3rd of 4 Orthovisc injections. He is having some pain over the patellar tendon today. Describes an achiness with both sitting, standing, and walking.     Past Medical History:  Diagnosis Date  . Acute kidney failure ([place])   . Arthritis   . Back pain   . BPH (benign prostatic hypertrophy)   . Clostridium difficile infection   . Clotting disorder ([place])   . Depression   . Diabetes ([place]) 12/11/2016  . DVT (deep venous thrombosis) ([place])   . Hypertension   . Low back pain potentially associated with spinal stenosis   . Neuromuscular disorder ([place])   . Neuropathy of lower extremity    bilateral  . OSA (obstructive sleep apnea)   . PE (pulmonary embolism)   . Ventral hernia    Past Surgical History:  Procedure Laterality Date  . EYE SURGERY    . HERNIA REPAIR     Social History   Socioeconomic History  . Marital status: Married    Spouse name: Not on file  . Number of children: 2  . Years of education: Not on file  . Highest education level: Not on file  Occupational History  . Occupation: Retired  Scientific laboratory technician  . Financial resource strain: Not on file  . Food insecurity:    Worry: Not on file    Inability: Not on file  . Transportation needs:    Medical: Not on file    Non-medical: Not on file  Tobacco Use  . Smoking status: Former Smoker    Packs/day: 2.00    Years: 33.00    Pack years: 66.00    Types: Cigarettes    Start date: 08/30/1968    Last attempt to quit: 07/02/2001    Years since quitting: 16.4  . Smokeless tobacco: Never Used  . Tobacco comment: quit 12 years ago  Substance and Sexual Activity  . Alcohol use: Yes    Comment: occasional  . Drug use: No  . Sexual activity: Not Currently  Lifestyle  .  Physical activity:    Days per week: Not on file    Minutes per session: Not on file  . Stress: Not on file  Relationships  . Social connections:    Talks on phone: Not on file    Gets together: Not on file    Attends religious service: Not on file    Active member of club or organization: Not on file    Attends meetings of clubs or organizations: Not on file    Relationship status: Not on file  Other Topics Concern  . 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    Allergies  Allergen Reactions  . Codeine Sulfate Itching and Nausea Only   Family History  Problem Relation Age of Onset  . Diabetes Mother   . Pneumonia Mother   . Alzheimer's disease Mother   . Other Father 22       Drowned on boating accident  . Colon cancer Neg Hx   .  Colon polyps Neg Hx      Past medical history, social, surgical and family history all reviewed in electronic medical record.  No pertanent information unless stated regarding to the chief complaint.   Review of Systems:Review of systems updated and as accurate as of 11/23/17  No headache, visual changes, nausea, vomiting, diarrhea, constipation, dizziness, abdominal pain, skin rash, fevers, chills, night sweats, weight loss, swollen lymph nodes, body aches, , chest pain, shortness of breath, mood changes.  Positive muscle aches and joint swelling  Objective  Blood pressure (!) 118/52, pulse 81, height 5\' 7"  (1.702 m), weight 286 lb (129.7 kg), SpO2 91 %. Systems examined below as of 11/23/17   General: No apparent distress alert and oriented x3 mood and affect normal, dressed appropriately.  HEENT: Pupils equal, extraocular movements intact  Respiratory: Patient's speak in full sentences and does not appear short of breath  Cardiovascular: No lower extremity edema, non tender, no erythema    Skin: Warm dry intact with no signs of infection or rash on extremities or on axial skeleton.  Abdomen: Soft nontender  Neuro: Cranial nerves II through XII are intact, neurovascularly intact in all extremities with 2+ DTRs and 2+ pulses.  Lymph: No lymphadenopathy of posterior or anterior cervical chain or axillae bilaterally.  Gait mild antalgic gait MSK:  Non tender with full range of motion and good stability and symmetric strength and tone of shoulders, elbows, wrist, hip, and ankles bilaterally.  Knee: Left valgus deformity noted. Large thigh to calf ratio.  Tender to palpation over medial and PF joint line.  ROM full in flexion and extension and lower leg rotation. instability with valgus force.  painful patellar compression. Patellar glide with moderate crepitus. Patellar and quadriceps tendons unremarkable. Hamstring and quadriceps strength is normal. Contralateral knee shows mild arthritic changes  After informed written and verbal consent, patient was seated on exam table. Left knee was prepped with alcohol swab and utilizing anterolateral approach, patient's left knee space was injected with15 mg/2.5 mL of Orthovisc(sodium hyaluronate) in a prefilled syringe was injected easily into the knee through a 22-gauge needle..Patient tolerated the procedure well without immediate complications.   Impression and Recommendations:     This case required medical decision making of moderate complexity.      Note: This dictation was prepared with Dragon dictation along with smaller phrase technology. Any transcriptional errors that result from this process are unintentional.

## 2017-11-23 ENCOUNTER — Encounter: Payer: Self-pay | Admitting: Family Medicine

## 2017-11-23 ENCOUNTER — Ambulatory Visit (INDEPENDENT_AMBULATORY_CARE_PROVIDER_SITE_OTHER): Payer: Medicare HMO | Admitting: Family Medicine

## 2017-11-23 DIAGNOSIS — M1712 Unilateral primary osteoarthritis, left knee: Secondary | ICD-10-CM | POA: Diagnosis not present

## 2017-11-23 NOTE — Assessment & Plan Note (Signed)
Visco supplementation given again today.  Discussed icing regimen and home exercise.  Discussed which activities to do which wants to avoid.  Increase activity as tolerated.  Follow-up again injection

## 2017-11-28 NOTE — Progress Notes (Signed)
[name] [address] [place], [place] [address] Phone: [phone] Subjective:    I'm seeing this patient by the request  of:    CC: Knee pain  MWU:[id number]  Tanner [name] is a 72 y.o. male coming in with complaint of knee pain. He is here today for the 4th injection of orthovisc. He has had improvement. Does note that he had some pain this past weekend. Not on feet more than usual. Also is complaining of tightness in the MCP joints of his hands bilaterally. Started about 2 weeks ago.        Past Medical History:  Diagnosis Date  . Acute kidney failure ([place])   . Arthritis   . Back pain   . BPH (benign prostatic hypertrophy)   . Clostridium difficile infection   . Clotting disorder ([place])   . Depression   . Diabetes ([place]) 12/11/2016  . DVT (deep venous thrombosis) ([place])   . Hypertension   . Low back pain potentially associated with spinal stenosis   . Neuromuscular disorder ([place])   . Neuropathy of lower extremity    bilateral  . OSA (obstructive sleep apnea)   . PE (pulmonary embolism)   . Ventral hernia    Past Surgical History:  Procedure Laterality Date  . EYE SURGERY    . HERNIA REPAIR     Social History   Socioeconomic History  . Marital status: Married    Spouse name: Not on file  . Number of children: 2  . Years of education: Not on file  . Highest education level: Not on file  Occupational History  . Occupation: Retired  Scientific laboratory technician  . Financial resource strain: Not on file  . Food insecurity:    Worry: Not on file    Inability: Not on file  . Transportation needs:    Medical: Not on file    Non-medical: Not on file  Tobacco Use  . Smoking status: Former Smoker    Packs/day: 2.00    Years: 33.00    Pack years: 66.00    Types: Cigarettes    Start date: 08/30/1968    Last attempt to quit: 07/02/2001    Years since quitting: 16.4  . Smokeless tobacco: Never Used  . Tobacco comment: quit 12 years ago    Substance and Sexual Activity  . Alcohol use: Yes    Comment: occasional  . Drug use: No  . Sexual activity: Not Currently  Lifestyle  . Physical activity:    Days per week: Not on file    Minutes per session: Not on file  . Stress: Not on file  Relationships  . Social connections:    Talks on phone: Not on file    Gets together: Not on file    Attends religious service: Not on file    Active member of club or organization: Not on file    Attends meetings of clubs or organizations: Not on file    Relationship status: Not on file  Other Topics Concern  . 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    Allergies  Allergen Reactions  . Codeine Sulfate Itching and Nausea Only   Family History  Problem Relation Age of Onset  . Diabetes Mother   .  Pneumonia Mother   . Alzheimer's disease Mother   . Other Father 6       Drowned on boating accident  . Colon cancer Neg Hx   . Colon polyps Neg Hx      Past medical history, social, surgical and family history all reviewed in electronic medical record.  No pertanent information unless stated regarding to the chief complaint.   Objective  Blood pressure (!) 126/58, pulse 78, height 5\' 7"  (1.702 m), weight 287 lb (130.2 kg), SpO2 91 %. Systems examined below as of 11/29/17   General: No apparent distress alert and oriented x3 mood and affect normal, dressed appropriately.  HEENT: Pupils equal, extraocular movements intact  Respiratory: Patient's speak in full sentences and does not appear short of breath  Cardiovascular: No lower extremity edema, non tender, no erythema  Skin: Warm dry intact with no signs of infection or rash on extremities or on axial skeleton.  Abdomen: Soft nontender  Neuro: Cranial nerves II through XII are intact, neurovascularly intact in all  extremities with 2+ DTRs and 2+ pulses.  Lymph: No lymphadenopathy of posterior or anterior cervical chain or axillae bilaterally.  Gait mild antalgic MSK:  Non tender with full range of motion and good stability and symmetric strength and tone of shoulders, elbows, wrist, hip, and ankles bilaterally.  Knee: Left valgus deformity noted. Large thigh to calf ratio.  Tender to palpation over medial and PF joint line.  ROM full in flexion and extension and lower leg rotation. instability with valgus force.  painful patellar compression. Patellar glide with moderate crepitus. Patellar and quadriceps tendons unremarkable. Hamstring and quadriceps strength is normal. Contralateral knee shows mild arthritic changes  After informed written and verbal consent, patient was seated on exam table. Left knee was prepped with alcohol swab and utilizing anterolateral approach, patient's left knee space was injected with15 mg/2.5 mL of Orthovisc(sodium hyaluronate) in a prefilled syringe was injected easily into the knee through a 22-gauge needle..Patient tolerated the procedure well without immediate complications.   Impression and Recommendations:     This case required medical decision making of moderate complexity.      Note: This dictation was prepared with Dragon dictation along with smaller phrase technology. Any transcriptional errors that result from this process are unintentional.

## 2017-11-29 ENCOUNTER — Other Ambulatory Visit (INDEPENDENT_AMBULATORY_CARE_PROVIDER_SITE_OTHER): Payer: Medicare HMO

## 2017-11-29 ENCOUNTER — Ambulatory Visit (INDEPENDENT_AMBULATORY_CARE_PROVIDER_SITE_OTHER): Payer: Medicare HMO | Admitting: Family Medicine

## 2017-11-29 VITALS — BP 126/58 | HR 78 | Ht 67.0 in | Wt 287.0 lb

## 2017-11-29 DIAGNOSIS — M1712 Unilateral primary osteoarthritis, left knee: Secondary | ICD-10-CM

## 2017-11-29 DIAGNOSIS — M2550 Pain in unspecified joint: Secondary | ICD-10-CM

## 2017-11-29 LAB — CBC WITH DIFFERENTIAL/PLATELET
Basophils Absolute: 0.1 K/uL (ref 0.0–0.1)
Basophils Relative: 0.7 % (ref 0.0–3.0)
Eosinophils Absolute: 0.3 K/uL (ref 0.0–0.7)
Eosinophils Relative: 3.6 % (ref 0.0–5.0)
HCT: 44.3 % (ref 39.0–52.0)
Hemoglobin: 15.2 g/dL (ref 13.0–17.0)
Lymphocytes Relative: 20.8 % (ref 12.0–46.0)
Lymphs Abs: 1.7 K/uL (ref 0.7–4.0)
MCHC: 34.2 g/dL (ref 30.0–36.0)
MCV: 85.9 fl (ref 78.0–100.0)
Monocytes Absolute: 0.5 K/uL (ref 0.1–1.0)
Monocytes Relative: 6.5 % (ref 3.0–12.0)
Neutro Abs: 5.6 K/uL (ref 1.4–7.7)
Neutrophils Relative %: 68.4 % (ref 43.0–77.0)
Platelets: 194 K/uL (ref 150.0–400.0)
RBC: 5.16 Mil/uL (ref 4.22–5.81)
RDW: 15.4 % (ref 11.5–15.5)
WBC: 8.2 K/uL (ref 4.0–10.5)

## 2017-11-29 LAB — IBC PANEL
Iron: 80 ug/dL (ref 42–165)
Saturation Ratios: 22.9 % (ref 20.0–50.0)
Transferrin: 249 mg/dL (ref 212.0–360.0)

## 2017-11-29 LAB — BASIC METABOLIC PANEL
BUN: 16 mg/dL (ref 6–23)
CHLORIDE: 98 meq/L (ref 96–112)
CO2: 29 meq/L (ref 19–32)
Calcium: 9.1 mg/dL (ref 8.4–10.5)
Creatinine, Ser: 1.2 mg/dL (ref 0.40–1.50)
GFR: 63.06 mL/min (ref >60.00)
Glucose, Bld: 207 mg/dL — ABNORMAL HIGH (ref 70–99)
POTASSIUM: 3.4 meq/L — AB (ref 3.5–5.1)
SODIUM: 139 meq/L (ref 135–145)

## 2017-11-29 LAB — URIC ACID: Uric Acid, Serum: 10.3 mg/dL — ABNORMAL HIGH (ref 4.0–7.8)

## 2017-11-29 LAB — VITAMIN D 25 HYDROXY (VIT D DEFICIENCY, FRACTURES): VITD: 29.47 ng/mL — ABNORMAL LOW (ref 30.00–100.00)

## 2017-11-29 LAB — SEDIMENTATION RATE: Sed Rate: 26 mm/hr — ABNORMAL HIGH (ref 0–20)

## 2017-11-29 MED ORDER — ALLOPURINOL 100 MG PO TABS: 200.0000 mg | ORAL_TABLET | Freq: Every day | ORAL | 6 refills | Status: DC

## 2017-11-29 NOTE — Assessment & Plan Note (Signed)
Patient given injection.  Tolerated the procedure.  Discussed icing regimen and home exercise.  Discussed which activities doing which wants to avoid.  Patient is to increase activity as tolerated.  Follow-up again in 4 to 8 weeks

## 2017-11-29 NOTE — Addendum Note (Signed)
Addended by: Lyndal Pulley on: 11/29/2017 04:40 PM   Modules accepted: Orders

## 2017-11-29 NOTE — Patient Instructions (Addendum)
You made it! You are done with [name].  As long as it goes well see [name] when you need [name].  Ice is your friend.  Get labs downstairs maybe from the cancer center to watch the kidney Have a great 4th

## 2017-11-30 ENCOUNTER — Ambulatory Visit: Payer: Medicare HMO | Admitting: Family Medicine

## 2017-12-20 ENCOUNTER — Other Ambulatory Visit: Payer: Self-pay | Admitting: Internal Medicine

## 2017-12-20 ENCOUNTER — Other Ambulatory Visit: Payer: Self-pay | Admitting: Neurology

## 2017-12-21 ENCOUNTER — Inpatient Hospital Stay (HOSPITAL_COMMUNITY)
Admission: EM | Admit: 2017-12-21 | Discharge: 2017-12-21 | Disposition: A | Discharge status: Home / Self Care | Payer: No Typology Code available for payment source

## 2017-12-21 DIAGNOSIS — M549 Dorsalgia, unspecified: Secondary | ICD-10-CM

## 2017-12-23 ENCOUNTER — Encounter: Payer: Self-pay | Admitting: Hematology & Oncology

## 2017-12-23 ENCOUNTER — Inpatient Hospital Stay: Payer: Medicare HMO

## 2017-12-23 ENCOUNTER — Other Ambulatory Visit: Payer: Self-pay

## 2017-12-23 ENCOUNTER — Inpatient Hospital Stay: Payer: Medicare HMO | Attending: Hematology & Oncology | Admitting: Hematology & Oncology

## 2017-12-23 VITALS — BP 136/55 | HR 67 | Temp 97.8°F | Resp 18 | Wt 288.0 lb

## 2017-12-23 DIAGNOSIS — I82502 Chronic embolism and thrombosis of unspecified deep veins of left lower extremity: Secondary | ICD-10-CM | POA: Insufficient documentation

## 2017-12-23 DIAGNOSIS — Z7901 Long term (current) use of anticoagulants: Secondary | ICD-10-CM

## 2017-12-23 DIAGNOSIS — I2601 Septic pulmonary embolism with acute cor pulmonale: Secondary | ICD-10-CM

## 2017-12-23 DIAGNOSIS — I82402 Acute embolism and thrombosis of unspecified deep veins of left lower extremity: Secondary | ICD-10-CM

## 2017-12-23 DIAGNOSIS — R635 Abnormal weight gain: Secondary | ICD-10-CM

## 2017-12-23 DIAGNOSIS — I2699 Other pulmonary embolism without acute cor pulmonale: Secondary | ICD-10-CM | POA: Diagnosis not present

## 2017-12-23 LAB — CMP (CANCER CENTER ONLY)
ALBUMIN: 3.8 g/dL (ref 3.5–5.0)
ALT: 20 U/L (ref 0–44)
AST: 20 U/L (ref 15–41)
Alkaline Phosphatase: 104 U/L (ref 38–126)
Anion gap: 10 (ref 5–15)
BUN: 13 mg/dL (ref 8–23)
CHLORIDE: 99 mmol/L (ref 98–111)
CO2: 30 mmol/L (ref 22–32)
Calcium: 9.5 mg/dL (ref 8.9–10.3)
Creatinine: 1.28 mg/dL — ABNORMAL HIGH (ref 0.61–1.24)
GFR, EST NON AFRICAN AMERICAN: 54 mL/min — AB (ref >60)
GFR, Est AFR Am: >60 mL/min (ref >60)
Glucose, Bld: 184 mg/dL — ABNORMAL HIGH (ref 70–99)
POTASSIUM: 4 mmol/L (ref 3.5–5.1)
SODIUM: 139 mmol/L (ref 135–145)
Total Bilirubin: 0.7 mg/dL (ref 0.3–1.2)
Total Protein: 6.5 g/dL (ref 6.5–8.1)

## 2017-12-23 LAB — CBC WITH DIFFERENTIAL (CANCER CENTER ONLY)
BASOS PCT: 1 %
Basophils Absolute: 0 10*3/uL (ref 0.0–0.1)
EOS ABS: 0.3 10*3/uL (ref 0.0–0.5)
Eosinophils Relative: 4 %
HCT: 46.3 % (ref 38.7–49.9)
HEMOGLOBIN: 15.3 g/dL (ref 13.0–17.1)
Lymphocytes Relative: 24 %
Lymphs Abs: 1.8 10*3/uL (ref 0.9–3.3)
MCH: 29.3 pg (ref 28.0–33.4)
MCHC: 33 g/dL (ref 32.0–35.9)
MCV: 88.5 fL (ref 82.0–98.0)
MONO ABS: 0.4 10*3/uL (ref 0.1–0.9)
MONOS PCT: 5 %
NEUTROS PCT: 66 %
Neutro Abs: 5.1 10*3/uL (ref 1.5–6.5)
Platelet Count: 176 10*3/uL (ref 145–400)
RBC: 5.23 MIL/uL (ref 4.20–5.70)
RDW: 15.6 % (ref 11.1–15.7)
WBC Count: 7.7 10*3/uL (ref 4.0–10.0)

## 2017-12-23 NOTE — Progress Notes (Signed)
Hematology and Oncology Follow Up Visit  Tanner [name] [id number] [birth year] 72 y.o. 12/23/2017   Principle Diagnosis:  Recurrent pulmonary emboli/left leg thrombus -- Idiopathic  Current Therapy:   Eliquis 5 mg p.o. twice daily - started 06/29/2017   Interim History: Tanner [name] is here today for follow-up. Not a temperature of his he is doing okay.  He has had no problems with respect to the Eliquis.  He has had no bleeding.  He unfortunately is gaining weight.  This is a problem.  He is not exercising.  He stays around the house most of the time.  His wife, who is an oxygen dependent COPD, Really cannot do all that much.  As such, he does not do all that much.   he has had no change in medications.  He has had no change in bowel or bladder habits.  He has had no chest wall pain.  He has had no cough.  ECOG Performance Status: 1 - Symptomatic but completely ambulatory  Medications:  Allergies as of 12/23/2017      Reactions   Codeine Sulfate Itching, Nausea Only      Medication List        Accurate as of 12/23/17  2:19 PM. Always use your most recent med list.          acetaminophen 500 MG tablet Commonly known as:  TYLENOL Take 1,000 mg by mouth as needed.   allopurinol 100 MG tablet Commonly known as:  ZYLOPRIM Take 2 tablets (200 mg total) by mouth daily.   apixaban 5 MG Tabs tablet Commonly known as:  ELIQUIS Take 1 tablet (5 mg total) by mouth 2 (two) times daily.   atenolol 25 MG tablet Commonly known as:  TENORMIN Take 12.5 mg by mouth daily.   buPROPion 150 MG 12 hr tablet Commonly known as:  WELLBUTRIN SR Take 150 mg by mouth 2 (two) times daily.   cetirizine 10 MG tablet Commonly known as:  ZYRTEC TAKE 1 TABLET(10 MG) BY MOUTH DAILY   finasteride 5 MG tablet Commonly known as:  PROSCAR Take 5 mg by mouth at bedtime.   gabapentin 300 MG capsule Commonly known as:  NEURONTIN 600 mg. Take 2 tablets three daily AND 300 MG AT BEDTIME     hydrochlorothiazide 25 MG tablet Commonly known as:  HYDRODIURIL Take 25 mg by mouth daily.   ketoconazole 2 % shampoo Commonly known as:  NIZORAL Apply topically daily.   multivitamins ther. w/minerals Tabs tablet Take 1 tablet by mouth daily.   nortriptyline 10 MG capsule Commonly known as:  PAMELOR 2 CAPSULES EVERY NIGHT AT BEDTIME   saccharomyces boulardii 250 MG capsule Commonly known as:  FLORASTOR Take 1 capsule (250 mg total) by mouth 2 (two) times daily.   THERA-GESIC PLUS Crea Apply 1 application topically at bedtime. Apply to lower back   traMADol 50 MG tablet Commonly known as:  ULTRAM Take 100 mg by mouth 3 (three) times daily as needed.   zolpidem 10 MG tablet Commonly known as:  AMBIEN Take 1 tablet (10 mg total) by mouth at bedtime. For sleep       Allergies:  Allergies  Allergen Reactions  . Codeine Sulfate Itching and Nausea Only    Past Medical History, Surgical history, Social history, and Family History were reviewed and updated.  Review of Systems: Review of Systems  Constitutional: Negative.   HENT: Negative.   Eyes: Negative.   Respiratory: Negative.   Cardiovascular: Negative.  Gastrointestinal: Negative.   Genitourinary: Negative.   Musculoskeletal: Negative.   Skin: Negative.   Neurological: Negative.   Endo/Heme/Allergies: Negative.   Psychiatric/Behavioral: Negative.      Physical Exam:  weight is 288 lb (130.6 kg). His oral temperature is 97.8 F (36.6 C). His blood pressure is 136/55 (abnormal) and his pulse is 67. His respiration is 18 and oxygen saturation is 94%.   Wt Readings from Last 3 Encounters:  12/23/17 288 lb (130.6 kg)  11/29/17 287 lb (130.2 kg)  11/23/17 286 lb (129.7 kg)    Physical Exam  Constitutional: He is oriented to person, place, and time.  HENT:  Head: Normocephalic and atraumatic.  Mouth/Throat: Oropharynx is clear and moist.  Eyes: Pupils are equal, round, and reactive to light. EOM are  normal.  Neck: Normal range of motion.  Cardiovascular: Normal rate, regular rhythm and normal heart sounds.  Pulmonary/Chest: Effort normal and breath sounds normal.  Abdominal: Soft. Bowel sounds are normal.  Musculoskeletal: Normal range of motion. He exhibits no edema, tenderness or deformity.  Lymphadenopathy:    He has no cervical adenopathy.  Neurological: He is alert and oriented to person, place, and time.  Skin: Skin is warm and dry. No rash noted. No erythema.  Psychiatric: He has a normal mood and affect. His behavior is normal. Judgment and thought content normal.  Vitals reviewed.    Lab Results  Component Value Date   WBC 7.7 12/23/2017   HGB 15.3 12/23/2017   HCT 46.3 12/23/2017   MCV 88.5 12/23/2017   PLT 176 12/23/2017   Lab Results  Component Value Date   IRON 80 11/29/2017   IRONPCTSAT 22.9 11/29/2017   Lab Results  Component Value Date   RBC 5.23 12/23/2017   No results found for: KPAFRELGTCHN, LAMBDASER, [facility] Lab Results  Component Value Date   IGGSERUM 549 (L) 04/07/2017   IGMSERUM 144 04/07/2017   Lab Results  Component Value Date   ALBUMINELP 3.7 (L) 04/07/2017   BETS 0.5 04/07/2017   BETA2SER 0.3 04/07/2017   GAMS 0.6 (L) 04/07/2017   SPEI  04/07/2017     Comment:     . One or more serum protein fractions are outside the normal ranges.  No abnormal protein bands are apparent. .      Chemistry      Component Value Date/Time   NA 139 11/29/2017 1332   NA 140 11/07/2014   NA 136 12/29/2013 1356   K 3.4 (L) 11/29/2017 1332   K 3.4 12/29/2013 1356   CL 98 11/29/2017 1332   CL 97 (L) 12/29/2013 1356   CO2 29 11/29/2017 1332   CO2 33 12/29/2013 1356   BUN 16 11/29/2017 1332   BUN 18 11/07/2014   BUN 15 12/29/2013 1356   CREATININE 1.20 11/29/2017 1332   CREATININE 1.30 (H) 09/23/2017 0935   CREATININE 1.2 12/29/2013 1356   GLU 84 11/07/2014      Component Value Date/Time   CALCIUM 9.1 11/29/2017 1332   CALCIUM 8.9  12/29/2013 1356   ALKPHOS 90 (H) 09/23/2017 0935   ALKPHOS 59 12/29/2013 1356   AST 20 09/23/2017 0935   ALT 28 09/23/2017 0935   ALT 27 12/29/2013 1356   BILITOT 0.8 09/23/2017 0935      Impression and Plan: Tanner [name] is a very pleasant 72 yo caucasian gentleman with recurrent Pulmonary embolism/thromboembolic disease.  He is on lifelong Eliquis.  I do not think we have to do any scans on  him.  I do not think we need any Dopplers right now.  He has a chronic nonocclusive thrombus in his left leg.  We will go ahead and plan to see him back in 6 months.  I think this would be reasonable at this point.ions or concerns. We can certainly see him sooner if need be.   Volanda Napoleon, MD 7/11/20192:19 PM

## 2017-12-29 DIAGNOSIS — I4892 Unspecified atrial flutter: Secondary | ICD-10-CM | POA: Insufficient documentation

## 2018-01-26 ENCOUNTER — Inpatient Hospital Stay (HOSPITAL_COMMUNITY)
Admission: EM | Admit: 2018-01-26 | Discharge: 2018-01-26 | Disposition: A | Discharge status: Home / Self Care | Payer: No Typology Code available for payment source

## 2018-01-26 DIAGNOSIS — R0600 Dyspnea, unspecified: Secondary | ICD-10-CM

## 2018-01-26 DIAGNOSIS — Y92410 Unspecified street and highway as the place of occurrence of the external cause: Secondary | ICD-10-CM

## 2018-03-17 ENCOUNTER — Telehealth: Payer: Self-pay | Admitting: *Deleted

## 2018-03-17 NOTE — Telephone Encounter (Signed)
Patient is scheduled for a colonoscopy on 05/31/18 and he is currently on Eliquis. He'd like to know instruction for the medication.  Dr Marin Olp would like the patient to hold Eliquis 2 days before and restart the day after his procedure.  Patient is aware of instruction. Confirmed with teach back.

## 2018-03-18 ENCOUNTER — Ambulatory Visit (INDEPENDENT_AMBULATORY_CARE_PROVIDER_SITE_OTHER): Payer: Self-pay

## 2018-03-20 ENCOUNTER — Other Ambulatory Visit: Payer: Self-pay | Admitting: Neurology

## 2018-03-29 LAB — BASIC METABOLIC PANEL, FASTING
CREATININE: 1.02
GLUCOSE, FASTING: 92
POTASSIUM: 3.7
SODIUM: 140

## 2018-03-29 LAB — LIPID PANEL
CHOLESTEROL: 198
HDL-CHOLESTEROL: 58
LDL (CALCULATED): 82
TRIGLYCERIDES: 82

## 2018-03-29 LAB — AST (SGOT): AST (SGOT): 15

## 2018-03-30 ENCOUNTER — Encounter (INDEPENDENT_AMBULATORY_CARE_PROVIDER_SITE_OTHER): Payer: Self-pay | Admitting: Family Medicine

## 2018-03-30 ENCOUNTER — Ambulatory Visit (INDEPENDENT_AMBULATORY_CARE_PROVIDER_SITE_OTHER): Payer: No Typology Code available for payment source | Admitting: Family Medicine

## 2018-03-30 VITALS — BP 132/96 | HR 58 | Ht 70.5 in | Wt 230.4 lb

## 2018-03-30 DIAGNOSIS — I10 Essential (primary) hypertension: Principal | ICD-10-CM

## 2018-03-30 DIAGNOSIS — I4892 Unspecified atrial flutter: Secondary | ICD-10-CM

## 2018-03-30 DIAGNOSIS — E782 Mixed hyperlipidemia: Secondary | ICD-10-CM

## 2018-03-30 NOTE — Progress Notes (Signed)
Thomas [name] is a 72 y.o. male who presents today for follow-up of hypertension and hyperlipidemia and chronic medical problems.  The patient reports current adherence to recommended diet is good. The pt is compliant with medicine.  He was here a few weeks ago and had his Lasix and potassium restarted because his blood pressure was elevated.  He brought in a list of his readings and you can see exactly where he went back on the Lasix his blood pressure started coming down dramatically.  He had been consistently 140s to 160s and sometimes 170s and then after he restarted the Lasix it has now been in the 110s to 130s consistently.  Regarding HTN: The following symptoms are also present: none, and he also denies the following symptoms: headache, blurred vision, chest discomfort, shortness of breath and palpitations.  Side effects of medication:none.  Pt's BP has been well controlled at home.   Regarding lipid treatment: He isnot taking lipitor anymore. and is having no side effects.  He states he just did not want to take it as he was trying to decrease the number of pills he had to take.  He admits that he did not really have problems with that.  He states he is willing to start taking it again every other day.  Last labs revealed a total cholesterol of 198, triglycerides of 82, LDL 122, HDL of 58    REVIEW OF SYSTEMS:  GENERAL: negative for fevers/chills, fatigue, significant weight change, sleep disturbance  HEENT:denies visual changes, sore throat or URI symptoms  RESPIRATORY: No shortness of breath, cough, or wheeze  CARDIAC: No chest pains;no exertional dyspnea, no palpitations  GI: no nausea/vomitting/diarrhea, no abdominal pain  MUSCULOSKELETAL: no myalgias or arthragias, no recent trauma, no edema   NEUROLOGIC: no headache, visual changes or mental status changes  GU: no urinary symptoms or CVA tenderness      No Known Allergies  Current Outpatient Medications   Medication Sig   . amLODIPine (NORVASC) 5 mg  Oral Tablet TAKE ONE TABLET BY MOUTH DAILY   . apixaban (ELIQUIS) 5 mg Oral Tablet Take 5 mg by mouth   . furosemide (LASIX) 20 mg Oral Tablet Take 20 mg by mouth   . nitroGLYCERIN (NITROSTAT) 0.4 mg Sublingual Tablet, Sublingual 0.4 mg by Sublingual route   . potassium chloride (KLOR-CON) 10 mEq Oral Tablet Sustained Release Take 10 mEq by mouth     Past Medical History:   Diagnosis Date   . Arthritis    . Dysuria    . Enteritis due to Rotavirus    . Gout    . Headache    . Hemorrhoid    . Hypercholesterolemia    . Hypertension          Past Surgical History:   Procedure Laterality Date   . HX HEMORRHOIDECTOMY           Social History     Tobacco Use   . Smoking status: Former Smoker     Years: 60.00   . Smokeless tobacco: Current User     Types: Chew, Snuff   Substance Use Topics   . Alcohol use: Never     Frequency: Never        PHYSICAL EXAM:   The patient appears to be in no acute distress.  Vitals: BP (!) 132/96   Pulse 58   Ht 1.791 m (5' 10.5")   Wt 104.5 kg (230 lb 6 oz)   SpO2 97%  BMI 32.59 kg/m   Respiratory: clear to ascultation B/L, no respiratory distress, equal BS throughout  Heart: RRR, no murmur, no edema, normal peripheral pulses  Abdomen: soft, nontender, positive bowel sounds  Extremities: no edema, no calf pain or tenderness  Neuro: AAOx3, cranial nerves's intact, DTR's 2/4 throughout  Skin: warm and dry, no rashes or lesions    ASSESSMENT:   1. Essential hypertension --blood pressure much better controlled .  Continue current regimen.   2. Mixed hyperlipidemia --patient is going to restart a statin maybe take it every other day.  Repeat his labs in 6 months   3. Atrial flutter, unspecified type (CMS HCC) --asymptomatic.  Now normal sinus rhythm       PLAN:   I reviewed appropriate screenings and HCM with patient.  He refuses a flu shot today.  He is up-to-date on pneumonia vaccines and tetanus  Orders Placed This Encounter   . ALT (SGPT)   . AST (SGOT)   . LIPID PANEL   . BASIC  METABOLIC PANEL, FASTING     Medication: continue current medication regimen unchanged  Return in about 6 months (around 09/29/2018) for blood work first.

## 2018-04-01 LAB — ALT (SGPT): ALT (SGPT): 14

## 2018-04-06 ENCOUNTER — Other Ambulatory Visit (INDEPENDENT_AMBULATORY_CARE_PROVIDER_SITE_OTHER): Payer: Self-pay

## 2018-06-29 ENCOUNTER — Encounter: Payer: Self-pay | Admitting: Hematology & Oncology

## 2018-06-29 ENCOUNTER — Inpatient Hospital Stay (HOSPITAL_BASED_OUTPATIENT_CLINIC_OR_DEPARTMENT_OTHER): Payer: Medicare HMO | Admitting: Hematology & Oncology

## 2018-06-29 ENCOUNTER — Other Ambulatory Visit: Payer: Self-pay

## 2018-06-29 ENCOUNTER — Inpatient Hospital Stay: Payer: Medicare HMO | Attending: Hematology & Oncology

## 2018-06-29 VITALS — BP 128/52 | HR 77 | Temp 98.3°F | Resp 16 | Wt 276.5 lb

## 2018-06-29 DIAGNOSIS — I82411 Acute embolism and thrombosis of right femoral vein: Secondary | ICD-10-CM

## 2018-06-29 DIAGNOSIS — I82502 Chronic embolism and thrombosis of unspecified deep veins of left lower extremity: Secondary | ICD-10-CM | POA: Diagnosis not present

## 2018-06-29 DIAGNOSIS — Z7901 Long term (current) use of anticoagulants: Secondary | ICD-10-CM | POA: Insufficient documentation

## 2018-06-29 DIAGNOSIS — I10 Essential (primary) hypertension: Secondary | ICD-10-CM

## 2018-06-29 DIAGNOSIS — E1140 Type 2 diabetes mellitus with diabetic neuropathy, unspecified: Secondary | ICD-10-CM

## 2018-06-29 DIAGNOSIS — J8410 Pulmonary fibrosis, unspecified: Secondary | ICD-10-CM | POA: Insufficient documentation

## 2018-06-29 DIAGNOSIS — I2782 Chronic pulmonary embolism: Secondary | ICD-10-CM

## 2018-06-29 DIAGNOSIS — E119 Type 2 diabetes mellitus without complications: Secondary | ICD-10-CM | POA: Diagnosis not present

## 2018-06-29 DIAGNOSIS — I2699 Other pulmonary embolism without acute cor pulmonale: Secondary | ICD-10-CM | POA: Insufficient documentation

## 2018-06-29 DIAGNOSIS — Z794 Long term (current) use of insulin: Principal | ICD-10-CM

## 2018-06-29 DIAGNOSIS — I2601 Septic pulmonary embolism with acute cor pulmonale: Secondary | ICD-10-CM

## 2018-06-29 DIAGNOSIS — G629 Polyneuropathy, unspecified: Secondary | ICD-10-CM | POA: Insufficient documentation

## 2018-06-29 HISTORY — DX: Acute embolism and thrombosis of right femoral vein: I82.411

## 2018-06-29 LAB — CBC WITH DIFFERENTIAL (CANCER CENTER ONLY)
Abs Immature Granulocytes: 0.04 10*3/uL (ref 0.00–0.07)
BASOS PCT: 1 %
Basophils Absolute: 0.1 10*3/uL (ref 0.0–0.1)
EOS ABS: 0.5 10*3/uL (ref 0.0–0.5)
Eosinophils Relative: 6 %
HCT: 45.3 % (ref 39.0–52.0)
Hemoglobin: 14.5 g/dL (ref 13.0–17.0)
IMMATURE GRANULOCYTES: 0 %
LYMPHS ABS: 1.7 10*3/uL (ref 0.7–4.0)
Lymphocytes Relative: 18 %
MCH: 29.1 pg (ref 26.0–34.0)
MCHC: 32 g/dL (ref 30.0–36.0)
MCV: 90.8 fL (ref 80.0–100.0)
MONO ABS: 0.5 10*3/uL (ref 0.1–1.0)
MONOS PCT: 6 %
Neutro Abs: 6.6 10*3/uL (ref 1.7–7.7)
Neutrophils Relative %: 69 %
Platelet Count: 201 10*3/uL (ref 150–400)
RBC: 4.99 MIL/uL (ref 4.22–5.81)
RDW: 15.1 % (ref 11.5–15.5)
WBC Count: 9.5 10*3/uL (ref 4.0–10.5)
nRBC: 0 % (ref 0.0–0.2)

## 2018-06-29 LAB — CMP (CANCER CENTER ONLY)
ALT: 12 U/L (ref 0–44)
AST: 13 U/L — AB (ref 15–41)
Albumin: 4.1 g/dL (ref 3.5–5.0)
Alkaline Phosphatase: 79 U/L (ref 38–126)
Anion gap: 8 (ref 5–15)
BUN: 16 mg/dL (ref 8–23)
CO2: 31 mmol/L (ref 22–32)
Calcium: 9.2 mg/dL (ref 8.9–10.3)
Chloride: 98 mmol/L (ref 98–111)
Creatinine: 1.35 mg/dL — ABNORMAL HIGH (ref 0.61–1.24)
GFR, EST AFRICAN AMERICAN: 60 mL/min — AB (ref >60)
GFR, EST NON AFRICAN AMERICAN: 52 mL/min — AB (ref >60)
Glucose, Bld: 144 mg/dL — ABNORMAL HIGH (ref 70–99)
POTASSIUM: 3.9 mmol/L (ref 3.5–5.1)
Sodium: 137 mmol/L (ref 135–145)
TOTAL PROTEIN: 6.2 g/dL — AB (ref 6.5–8.1)
Total Bilirubin: 0.7 mg/dL (ref 0.3–1.2)

## 2018-06-29 NOTE — Progress Notes (Signed)
Hematology and Oncology Follow Up Visit  ADAEL CULBREATH [id number] 03-Aug-1945 73 y.o. 06/29/2018   Principle Diagnosis:  Recurrent pulmonary emboli/left leg thrombus -- Idiopathic  Current Therapy:   Eliquis 5 mg p.o. twice daily - started 06/29/2017   Interim History: Mr. Ditullio is here today for follow-up.  He seems to be hanging in.  He is mostly affected by his diabetes.  He has neuropathy in his hands and feet.  He says that he got this from agent orange exposure in [place].  I think that the [place] is helping pay for this with respect to disability.  His wife is not doing all that well.  She has pulmonary fibrosis.  She is oxygen dependent.  She is becoming less active.  He is on Eliquis.  He is doing fairly well with Eliquis.  He has had no bleeding.  There is no change in bowel or bladder habits.  He has had no rashes.  He has had no headache.  Overall, his performance status is ECOG 1.    Medications:  Allergies as of 06/29/2018      Reactions   Codeine Sulfate Itching, Nausea Only      Medication List       Accurate as of June 29, 2018  2:26 PM. Always use your most recent med list.        acetaminophen 500 MG tablet Commonly known as:  TYLENOL Take 1,000 mg by mouth as needed.   allopurinol 100 MG tablet Commonly known as:  ZYLOPRIM Take 2 tablets (200 mg total) by mouth daily.   apixaban 5 MG Tabs tablet Commonly known as:  ELIQUIS Take 1 tablet (5 mg total) by mouth 2 (two) times daily.   atenolol 25 MG tablet Commonly known as:  TENORMIN Take 12.5 mg by mouth daily.   buPROPion 150 MG 12 hr tablet Commonly known as:  WELLBUTRIN SR Take 150 mg by mouth 2 (two) times daily.   finasteride 5 MG tablet Commonly known as:  PROSCAR Take 5 mg by mouth at bedtime.   gabapentin 300 MG capsule Commonly known as:  NEURONTIN 600 mg. Take 2 tablets three daily AND 300 MG AT BEDTIME   hydrochlorothiazide 25 MG tablet Commonly known as:  HYDRODIURIL Take  25 mg by mouth daily.   ketoconazole 2 % shampoo Commonly known as:  NIZORAL Apply topically daily.   multivitamins ther. w/minerals Tabs tablet Take 1 tablet by mouth daily.   nortriptyline 10 MG capsule Commonly known as:  PAMELOR 2 CAPSULES EVERY NIGHT AT BEDTIME   saccharomyces boulardii 250 MG capsule Commonly known as:  FLORASTOR Take 1 capsule (250 mg total) by mouth 2 (two) times daily.   THERA-GESIC PLUS Crea Apply 1 application topically at bedtime. Apply to lower back   traMADol 50 MG tablet Commonly known as:  ULTRAM Take 100 mg by mouth 3 (three) times daily as needed.   zolpidem 10 MG tablet Commonly known as:  AMBIEN Take 1 tablet (10 mg total) by mouth at bedtime. For sleep       Allergies:  Allergies  Allergen Reactions  . Codeine Sulfate Itching and Nausea Only    Past Medical History, Surgical history, Social history, and Family History were reviewed and updated.  Review of Systems: Review of Systems  Constitutional: Negative.   HENT: Negative.   Eyes: Negative.   Respiratory: Negative.   Cardiovascular: Negative.   Gastrointestinal: Negative.   Genitourinary: Negative.   Musculoskeletal: Negative.  Skin: Negative.   Neurological: Negative.   Endo/Heme/Allergies: Negative.   Psychiatric/Behavioral: Negative.      Physical Exam:  weight is 276 lb 8 oz (125.4 kg). His oral temperature is 98.3 F (36.8 C). His blood pressure is 128/52 (abnormal) and his pulse is 77. His respiration is 16 and oxygen saturation is 94%.   Wt Readings from Last 3 Encounters:  06/29/18 276 lb 8 oz (125.4 kg)  12/23/17 288 lb (130.6 kg)  11/29/17 287 lb (130.2 kg)    Physical Exam Vitals signs reviewed.  HENT:     Head: Normocephalic and atraumatic.  Eyes:     Pupils: Pupils are equal, round, and reactive to light.  Neck:     Musculoskeletal: Normal range of motion.  Cardiovascular:     Rate and Rhythm: Normal rate and regular rhythm.     Heart  sounds: Normal heart sounds.  Pulmonary:     Effort: Pulmonary effort is normal.     Breath sounds: Normal breath sounds.  Abdominal:     General: Bowel sounds are normal.     Palpations: Abdomen is soft.  Musculoskeletal: Normal range of motion.        General: No tenderness or deformity.  Lymphadenopathy:     Cervical: No cervical adenopathy.  Skin:    General: Skin is warm and dry.     Findings: No erythema or rash.  Neurological:     Mental Status: He is alert and oriented to person, place, and time.  Psychiatric:        Behavior: Behavior normal.        Thought Content: Thought content normal.        Judgment: Judgment normal.      Lab Results  Component Value Date   WBC 9.5 06/29/2018   HGB 14.5 06/29/2018   HCT 45.3 06/29/2018   MCV 90.8 06/29/2018   PLT 201 06/29/2018   Lab Results  Component Value Date   IRON 80 11/29/2017   IRONPCTSAT 22.9 11/29/2017   Lab Results  Component Value Date   RBC 4.99 06/29/2018   No results found for: KPAFRELGTCHN, LAMBDASER, [facility] Lab Results  Component Value Date   IGGSERUM 549 (L) 04/07/2017   IGMSERUM 144 04/07/2017   Lab Results  Component Value Date   ALBUMINELP 3.7 (L) 04/07/2017   A1GS 0.4 (H) 04/07/2017   A2GS 0.8 04/07/2017   BETS 0.5 04/07/2017   BETA2SER 0.3 04/07/2017   GAMS 0.6 (L) 04/07/2017   SPEI  04/07/2017     Comment:     . One or more serum protein fractions are outside the normal ranges.  No abnormal protein bands are apparent. .      Chemistry      Component Value Date/Time   NA 137 06/29/2018 1315   NA 140 11/07/2014   NA 136 12/29/2013 1356   K 3.9 06/29/2018 1315   K 3.4 12/29/2013 1356   CL 98 06/29/2018 1315   CL 97 (L) 12/29/2013 1356   CO2 31 06/29/2018 1315   CO2 33 12/29/2013 1356   BUN 16 06/29/2018 1315   BUN 18 11/07/2014   BUN 15 12/29/2013 1356   CREATININE 1.35 (H) 06/29/2018 1315   CREATININE 1.2 12/29/2013 1356   GLU 84 11/07/2014      Component  Value Date/Time   CALCIUM 9.2 06/29/2018 1315   CALCIUM 8.9 12/29/2013 1356   ALKPHOS 79 06/29/2018 1315   ALKPHOS 59 12/29/2013 1356   AST  13 (L) 06/29/2018 1315   ALT 12 06/29/2018 1315   ALT 27 12/29/2013 1356   BILITOT 0.7 06/29/2018 1315      Impression and Plan: Mr. Canada is a very pleasant 73 yo caucasian gentleman with recurrent Pulmonary embolism/thromboembolic disease.  He is on lifelong Eliquis.  I do not think we have to do any scans on him.  I do not think we need any Dopplers right now.  He has a chronic nonocclusive thrombus in his left leg.  We will go ahead and plan to see him back in 6 months.  I think this would be reasonable at this point. We can certainly see him sooner if need be.   Volanda Napoleon, MD 1/15/20202:26 PM

## 2018-06-30 ENCOUNTER — Ambulatory Visit: Payer: Medicare HMO | Admitting: Family Medicine

## 2018-06-30 ENCOUNTER — Encounter

## 2018-06-30 ENCOUNTER — Encounter: Payer: Self-pay | Admitting: Family Medicine

## 2018-06-30 DIAGNOSIS — M1712 Unilateral primary osteoarthritis, left knee: Secondary | ICD-10-CM | POA: Diagnosis not present

## 2018-06-30 NOTE — Progress Notes (Signed)
[name] [address], [place] [address] Phone: [phone] Subjective:    I Tanner [name] am serving as a Education administrator for Dr. Hulan Saas.    CC: Left knee pain  QAS:[id number]  Tanner [name] is a 73 y.o. male coming in with complaint of left knee pain. States that his knee is doing ok. States the injection did not last long. Had visco.  More discomfort, increasing swelling, increasing instability.  Patient has tried to be more active since the new year and is finding it difficult.      Past Medical History:  Diagnosis Date  . Acute kidney failure ([place])   . Arthritis   . Back pain   . BPH (benign prostatic hypertrophy)   . Clostridium difficile infection   . Clotting disorder ([place])   . Depression   . Diabetes ([place]) 12/11/2016  . DVT (deep venous thrombosis) ([place])   . DVT of deep femoral vein, right ([place]) 06/29/2018  . Hypertension   . Low back pain potentially associated with spinal stenosis   . Neuromuscular disorder ([place])   . Neuropathy of lower extremity    bilateral  . OSA (obstructive sleep apnea)   . PE (pulmonary embolism)   . Ventral hernia    Past Surgical History:  Procedure Laterality Date  . EYE SURGERY    . HERNIA REPAIR     Social History   Socioeconomic History  . Marital status: Married    Spouse name: Not on file  . Number of children: 2  . Years of education: Not on file  . Highest education level: Not on file  Occupational History  . Occupation: Retired  Scientific laboratory technician  . Financial resource strain: Not on file  . Food insecurity:    Worry: Not on file    Inability: Not on file  . Transportation needs:    Medical: Not on file    Non-medical: Not on file  Tobacco Use  . Smoking status: Former Smoker    Packs/day: 2.00    Years: 33.00    Pack years: 66.00    Types: Cigarettes    Start date: 08/30/1968    Last attempt to quit: 07/02/2001    Years since quitting: 17.0  . Smokeless tobacco: Never  Used  . Tobacco comment: quit 12 years ago  Substance and Sexual Activity  . Alcohol use: Yes    Comment: occasional  . Drug use: No  . Sexual activity: Not Currently  Lifestyle  . Physical activity:    Days per week: Not on file    Minutes per session: Not on file  . Stress: Not on file  Relationships  . Social connections:    Talks on phone: Not on file    Gets together: Not on file    Attends religious service: Not on file    Active member of club or organization: Not on file    Attends meetings of clubs or organizations: Not on file    Relationship status: Not on file  Other Topics Concern  . 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    Allergies  Allergen Reactions  . Codeine Sulfate Itching and Nausea Only   Family History  Problem Relation Age of Onset  .  Diabetes Mother   . Pneumonia Mother   . Alzheimer's disease Mother   . Other Father 53       Drowned on boating accident  . Colon cancer Neg Hx   . Colon polyps Neg Hx      Current Outpatient Medications (Cardiovascular):  .  atenolol (TENORMIN) 25 MG tablet, Take 12.5 mg by mouth daily. .  hydrochlorothiazide (HYDRODIURIL) 25 MG tablet, Take 25 mg by mouth daily.   Current Outpatient Medications (Analgesics):  .  acetaminophen (TYLENOL) 500 MG tablet, Take 1,000 mg by mouth as needed. Marland Kitchen  allopurinol (ZYLOPRIM) 100 MG tablet, Take 2 tablets (200 mg total) by mouth daily. .  traMADol (ULTRAM) 50 MG tablet, Take 100 mg by mouth 3 (three) times daily as needed.   Current Outpatient Medications (Hematological):  .  apixaban (ELIQUIS) 5 MG TABS tablet, Take 1 tablet (5 mg total) by mouth 2 (two) times daily.  Current Outpatient Medications (Other):  Marland Kitchen  buPROPion (WELLBUTRIN SR) 150 MG 12 hr tablet, Take 150 mg by mouth 2 (two) times daily. .   finasteride (PROSCAR) 5 MG tablet, Take 5 mg by mouth at bedtime. .  gabapentin (NEURONTIN) 300 MG capsule, 600 mg. Take 2 tablets three daily AND 300 MG AT BEDTIME .  ketoconazole (NIZORAL) 2 % shampoo, Apply topically daily. .  Menthol-Methyl Salicylate (THERA-GESIC PLUS) CREA, Apply 1 application topically at bedtime. Apply to lower back .  Multiple Vitamins-Minerals (MULTIVITAMINS THER. W/MINERALS) TABS, Take 1 tablet by mouth daily. .  nortriptyline (PAMELOR) 10 MG capsule, 2 CAPSULES EVERY NIGHT AT BEDTIME .  saccharomyces boulardii (FLORASTOR) 250 MG capsule, Take 1 capsule (250 mg total) by mouth 2 (two) times daily. Marland Kitchen  zolpidem (AMBIEN) 10 MG tablet, Take 1 tablet (10 mg total) by mouth at bedtime. For sleep    Past medical history, social, surgical and family history all reviewed in electronic medical record.  No pertanent information unless stated regarding to the chief complaint.   Review of Systems:  No headache, visual changes, nausea, vomiting, diarrhea, constipation, dizziness, abdominal pain, skin rash, fevers, chills, night sweats, weight loss, swollen lymph nodes, body aches,chest pain, shortness of breath, mood changes.  Positive muscle aches, joint swelling  Objective  Blood pressure 130/70, pulse 81, height 5\' 7"  (1.702 m), weight 275 lb (124.7 kg), SpO2 92 %.   General: No apparent distress alert and oriented x3 mood and affect normal, dressed appropriately.  Morbidly obese HEENT: Pupils equal, extraocular movements intact  Respiratory: Patient's speak in full sentences and does not appear short of breath  Cardiovascular: No lower extremity edema, non tender, no erythema  Skin: Warm dry intact with no signs of infection or rash on extremities or on axial skeleton.  Abdomen: Soft nontender  Obese  Neuro: Cranial nerves II through XII are intact, neurovascularly intact in all extremities with 2+ DTRs and 2+ pulses.  Lymph: No lymphadenopathy of posterior or anterior  cervical chain or axillae bilaterally.  Gait antalgic MSK:  tender with limited range of motion and good stability and symmetric strength and tone of shoulders, elbows, wrist, hip, and ankles bilaterally.  Knee: Left valgus deformity noted. Large thigh to calf ratio. Effusion noted  Tender to palpation over medial and PF joint line.  ROM full in flexion and extension and lower leg rotation. instability with valgus force.  painful patellar compression. Patellar glide with moderate crepitus. Patellar and quadriceps tendons unremarkable. Hamstring and quadriceps strength is normal. Contralateral knee shows mild arthritis  but no significant instability noted today  After informed written and verbal consent, patient was seated on exam table. Left knee was prepped with alcohol swab and utilizing anterolateral approach, patient's left knee space was injected with 4:1  marcaine 0.5%: Kenalog 40mg /dL. Patient tolerated the procedure well without immediate complications.    Impression and Recommendations:      The above documentation has been reviewed and is accurate and complete Tanner Pulley, DO       Note: This dictation was prepared with Dragon dictation along with smaller phrase technology. Any transcriptional errors that result from this process are unintentional.

## 2018-06-30 NOTE — Patient Instructions (Signed)
Good to see you  Ice is your friend Steroid injection today  Will get approval for other injections just in case See me again in 4-6 weeks

## 2018-06-30 NOTE — Assessment & Plan Note (Signed)
Worsening arthritis.  Given a steroid injection today.  We will see if we get approval for the possibility of Visco supplementation.  Has been 6 months, actually 7 months since the last injection of Visco supplementation.  We will follow-up and see how patient responds

## 2018-07-06 DIAGNOSIS — E11620 Type 2 diabetes mellitus with diabetic dermatitis: Secondary | ICD-10-CM | POA: Diagnosis not present

## 2018-07-06 DIAGNOSIS — G4700 Insomnia, unspecified: Secondary | ICD-10-CM | POA: Diagnosis not present

## 2018-07-06 DIAGNOSIS — Z6841 Body Mass Index (BMI) 40.0 and over, adult: Secondary | ICD-10-CM | POA: Diagnosis not present

## 2018-07-06 DIAGNOSIS — E1142 Type 2 diabetes mellitus with diabetic polyneuropathy: Secondary | ICD-10-CM | POA: Diagnosis not present

## 2018-07-06 DIAGNOSIS — I129 Hypertensive chronic kidney disease with stage 1 through stage 4 chronic kidney disease, or unspecified chronic kidney disease: Secondary | ICD-10-CM | POA: Diagnosis not present

## 2018-07-06 DIAGNOSIS — E1122 Type 2 diabetes mellitus with diabetic chronic kidney disease: Secondary | ICD-10-CM | POA: Diagnosis not present

## 2018-07-06 DIAGNOSIS — R69 Illness, unspecified: Secondary | ICD-10-CM | POA: Diagnosis not present

## 2018-07-06 DIAGNOSIS — G8929 Other chronic pain: Secondary | ICD-10-CM | POA: Diagnosis not present

## 2018-07-06 DIAGNOSIS — F419 Anxiety disorder, unspecified: Secondary | ICD-10-CM | POA: Diagnosis not present

## 2018-07-11 ENCOUNTER — Encounter (INDEPENDENT_AMBULATORY_CARE_PROVIDER_SITE_OTHER): Payer: Self-pay | Admitting: Nurse Practitioner

## 2018-07-11 ENCOUNTER — Ambulatory Visit (INDEPENDENT_AMBULATORY_CARE_PROVIDER_SITE_OTHER): Payer: No Typology Code available for payment source | Admitting: Nurse Practitioner

## 2018-07-11 VITALS — BP 140/72 | HR 63 | Ht 70.5 in | Wt 249.1 lb

## 2018-07-11 DIAGNOSIS — R600 Localized edema: Secondary | ICD-10-CM

## 2018-07-11 DIAGNOSIS — R635 Abnormal weight gain: Secondary | ICD-10-CM

## 2018-07-11 DIAGNOSIS — I4892 Unspecified atrial flutter: Secondary | ICD-10-CM

## 2018-07-11 DIAGNOSIS — R0602 Shortness of breath: Secondary | ICD-10-CM

## 2018-07-11 DIAGNOSIS — I10 Essential (primary) hypertension: Secondary | ICD-10-CM

## 2018-07-11 NOTE — Progress Notes (Addendum)
Thomas [name] is a 73 y.o. male who complains of Medication Check and Abnormal Weight Gain  .    HPI:  Pt here for an acute visit.  He is c/o intermittent sob, pedal edema and weight gain for the past 2 weeks.  He normally follows with Dr. Gordan Payment and last saw her for a regular f/u in 10-19.  At that visit, she advised him to resume taking his lasix and Kcl daily.  She documented that this was lasix 20 mg daily.  However, the pt states that he has been taking lasix 40 mg daily and we confirmed this dosage with his pharmacy.  He is taking kcl 10 mEq daily.  He wears compression stockings daily.  He states that his abd seems distended at times and his legs are more swollen than before.  He reports he actually took 80 mg of lasix on 2 occasions, on 07-04-2018 and 07-08-2018.  He thought it helped his sx the first time, but not the second time.  Today, he denies any sob and abd distention.  He feels he has been traveling more and maybe not as active due to weather.  He denies chest pain.  He is also c/o diarrhea for the past couple of months. Denies abdominal pain.  He is wondering if he could take a different diuretic in case the lasix is causing diarrhea.  He had seen cardiology in the past, but not recently.  He would be willing to see.  ECHO from 5-19 revealed EF 60%.  He does have a history of hypertension and had been on lisinopril 40 mg daily.  Amlodipine 5 mg was added to his regimen back in September.  He does have a history of atrial flutter and was started on a beta-blocker and anticoagulant at the time of diagnosis.  He states that his bp has been normal at home.   Pedal edema is always equal and bilateral and is never associated with redness, heat or calf pain.  He admits to some difficulty breathing when he lies flat, but states that these sx have been present and unchanged for the past 4 to 5 years.    REVIEW OF SYSTEMS:  GENERAL: negative for fatigue, significant weight change, sleep disturbance  HEENT: No  recent visual or hearing changes.   RESPIRATORY: No cough, wheezing or hemoptysis.  CARDIAC: No chest pains; no palpitations; no edema  GI: no N/V/D, no abdominal pain.  BMs regular  MUSCULOSKELETAL: no myalgias, no joint pain, no edema or erythema  NEUROLOGIC: no headache, weakness, numbness or tingling  GU: no urinary sxs or flank pain  SKIN:  negative for rashes.      No Known Allergies  Current Outpatient Medications   Medication Sig   . acetaminophen (TYLENOL) 500 mg Oral Tablet Take 500 mg by mouth Every 4 hours as needed for Pain (pt takes tylenol pm)   . amLODIPine (NORVASC) 5 mg Oral Tablet TAKE ONE TABLET BY MOUTH DAILY   . apixaban (ELIQUIS) 5 mg Oral Tablet Take 5 mg by mouth   . furosemide (LASIX) 20 mg Oral Tablet Take 20 mg by mouth   . lisinopril (PRINIVIL) 40 mg Oral Tablet TAKE ONE TABLET BY MOUTH EVERY DAY   . lisinopril (PRINIVIL) 40 mg Oral Tablet Take 40 mg by mouth   . metoprolol tartrate (LOPRESSOR) 25 mg Oral Tablet take 1/2 tablet by mouth twice daily.   . nitroGLYCERIN (NITROSTAT) 0.4 mg Sublingual Tablet, Sublingual 0.4 mg by Sublingual  route   . potassium chloride (KLOR-CON) 10 mEq Oral Tablet Sustained Release Take 10 mEq by mouth   . simvastatin (ZOCOR) 20 mg Oral Tablet Take 20 mg by mouth Every evening   . VITAMIN E ORAL Take 800 mg by mouth      Past Medical History:   Diagnosis Date   . Arthritis    . Dysuria    . Enteritis due to Rotavirus    . Gout    . Headache    . Hemorrhoid    . Hypercholesterolemia    . Hypertension          Patient Active Problem List   Diagnosis   . Atrial flutter (CMS HCC)   . Body mass index (BMI) of 33.0 to 33.9 in adult   . Chest tightness   . Essential hypertension   . Hyperlipidemia   . Left bundle branch hemiblock     Past Surgical History:   Procedure Laterality Date   . HX HEMORRHOIDECTOMY           Social History     Tobacco Use   . Smoking status: Former Smoker     Years: 60.00     Types: Cigarettes, Cigars   . Smokeless tobacco: Current User      Types: Chew, Snuff   Substance Use Topics   . Alcohol use: Never     Frequency: Never      Family Medical History:     Problem Relation (Age of Onset)    Cancer Mother    Hypertension (High Blood Pressure) Mother, Father              PHYSICAL EXAM:   The patient appears to be in no acute distress.  Vitals: BP 140/72   Pulse 63   Ht 1.791 m (5' 10.5")   Wt 113 kg (249 lb 2 oz)   SpO2 93%   BMI 35.24 kg/m    BP 140/80  He has gained 19 lb since his last visit March 30, 2018.   HEENT:   Head Normocephalic. No masses, lesions, tenderness or abnormalities  Eyes: PERRLA, EOMI, conjunctiva wnl.  Bilateral fundoscopic exams wnl.  Ears: Bilateral TMs intact and clear.  Nose: Bilateral nares patent  Throat: Pharynx clear. No lymphadenopathy or thyromegaly.  RESPIRATORY: CTA, no resp distress, equal BS  HEART: Regular rate and rhythm, no murmur, no edema, pedal pulses 2+  GI:  BS present in 4 quadrants.  No distention, tenderness, masses or organomegaly.  Abdomen soft.  EXTREMITIES: no heat, erythema or tenderness noted to bilateral LE.  Bilateral calf measurements are equal.  Homan's sign is neg to bilateral LE.    NEURO: AAOx3, CN's intact, DTR's 2/4 throughout, Romberg negative    ASSESSMENT:     ICD-10-CM    1. Pedal edema R60.0 CXR PA & Lat     Basic Metabolic Panel-Fasting     B Type Natriuretic Peptide   2. Shortness of breath R06.02 CXR PA & Lat     Basic Metabolic Panel-Fasting     B Type Natriuretic Peptide   3. Atrial flutter (CMS HCC) I48.92    4. Essential hypertension I10 CXR PA & Lat     Basic Metabolic Panel-Fasting     B Type Natriuretic Peptide   5. Weight gain R63.5 CXR PA & Lat     Basic Metabolic Panel-Fasting     B Type Natriuretic Peptide     PLAN: d/c amlodipine as this  may be the cause of the edema.  Continue lasix 40 mg daily and kcl 10 mEq daily for now.  Check cxr and labs.  Go to ER at oncew ith new/worsening sx and f/u 2 weeks for a recheck.  Orders Placed This Encounter   . CXR PA & Lat     . Basic Metabolic Panel-Fasting   . B Type Natriuretic Peptide

## 2018-07-11 NOTE — Patient Instructions (Addendum)
Discontinue amlodipine 5 mg daily.  Continue Lasix 40 mg daily.

## 2018-07-11 NOTE — Nursing Note (Signed)
Pt in office, states he has increased unexplained weight gain, claims he was not in the ED recently.

## 2018-07-14 ENCOUNTER — Other Ambulatory Visit (INDEPENDENT_AMBULATORY_CARE_PROVIDER_SITE_OTHER): Payer: Self-pay | Admitting: Nurse Practitioner

## 2018-07-14 DIAGNOSIS — R600 Localized edema: Secondary | ICD-10-CM

## 2018-07-14 DIAGNOSIS — R635 Abnormal weight gain: Secondary | ICD-10-CM

## 2018-07-14 DIAGNOSIS — R0602 Shortness of breath: Secondary | ICD-10-CM

## 2018-07-14 DIAGNOSIS — I10 Essential (primary) hypertension: Secondary | ICD-10-CM

## 2018-07-14 LAB — BASIC METABOLIC PANEL, FASTING
BUN: 14
CALCIUM: 9.4
CREATININE: 0.9
CREATININE: 0.9
GLUCOSE, FASTING: 99

## 2018-07-14 LAB — B-TYPE NATRIURETIC PEPTIDE: B-TYPE NATRIURETIC PEPTIDE: 37.8

## 2018-07-15 ENCOUNTER — Other Ambulatory Visit (INDEPENDENT_AMBULATORY_CARE_PROVIDER_SITE_OTHER): Payer: Self-pay

## 2018-07-15 ENCOUNTER — Telehealth (INDEPENDENT_AMBULATORY_CARE_PROVIDER_SITE_OTHER): Payer: Self-pay | Admitting: Nurse Practitioner

## 2018-07-15 ENCOUNTER — Encounter (INDEPENDENT_AMBULATORY_CARE_PROVIDER_SITE_OTHER): Payer: Self-pay | Admitting: Nurse Practitioner

## 2018-07-15 DIAGNOSIS — R600 Localized edema: Secondary | ICD-10-CM

## 2018-07-15 DIAGNOSIS — J986 Disorders of diaphragm: Secondary | ICD-10-CM

## 2018-07-15 DIAGNOSIS — R635 Abnormal weight gain: Secondary | ICD-10-CM

## 2018-07-15 DIAGNOSIS — R9389 Abnormal findings on diagnostic imaging of other specified body structures: Secondary | ICD-10-CM

## 2018-07-15 DIAGNOSIS — R197 Diarrhea, unspecified: Secondary | ICD-10-CM

## 2018-07-15 DIAGNOSIS — R0602 Shortness of breath: Secondary | ICD-10-CM

## 2018-07-15 DIAGNOSIS — R198 Other specified symptoms and signs involving the digestive system and abdomen: Secondary | ICD-10-CM

## 2018-07-15 DIAGNOSIS — I10 Essential (primary) hypertension: Secondary | ICD-10-CM

## 2018-07-15 NOTE — Telephone Encounter (Signed)
Chest x-ray is normal other than elevation of the left hemidiaphragm.  This was not noted on his previous chest x-ray or CTA chest from July 2019 the was noted on a CTA scan chest from August 2019.  This does warrant further investigation to see if there is underlying cause for this elevation.  Check CT abdomen pelvis with contrast to further evaluate.

## 2018-07-18 NOTE — Telephone Encounter (Signed)
vm left to call office.

## 2018-07-19 NOTE — Telephone Encounter (Signed)
2nd attempt to reach pt, left msg.

## 2018-07-20 ENCOUNTER — Encounter (INDEPENDENT_AMBULATORY_CARE_PROVIDER_SITE_OTHER): Payer: Self-pay | Admitting: Family Medicine

## 2018-07-20 NOTE — Telephone Encounter (Signed)
3rd attempt to reach the pt. Letter mailed.

## 2018-07-21 ENCOUNTER — Encounter (INDEPENDENT_AMBULATORY_CARE_PROVIDER_SITE_OTHER): Payer: Self-pay | Admitting: Nurse Practitioner

## 2018-07-25 ENCOUNTER — Encounter (INDEPENDENT_AMBULATORY_CARE_PROVIDER_SITE_OTHER): Payer: Self-pay | Admitting: Nurse Practitioner

## 2018-07-25 ENCOUNTER — Telehealth (INDEPENDENT_AMBULATORY_CARE_PROVIDER_SITE_OTHER): Payer: Self-pay | Admitting: Nurse Practitioner

## 2018-07-25 ENCOUNTER — Ambulatory Visit (INDEPENDENT_AMBULATORY_CARE_PROVIDER_SITE_OTHER): Payer: No Typology Code available for payment source | Admitting: Nurse Practitioner

## 2018-07-25 ENCOUNTER — Other Ambulatory Visit: Payer: Self-pay

## 2018-07-25 VITALS — BP 120/74 | HR 55 | Temp 97.8°F | Ht 70.5 in | Wt 248.0 lb

## 2018-07-25 DIAGNOSIS — R140 Abdominal distension (gaseous): Secondary | ICD-10-CM

## 2018-07-25 DIAGNOSIS — R0602 Shortness of breath: Secondary | ICD-10-CM

## 2018-07-25 DIAGNOSIS — R198 Other specified symptoms and signs involving the digestive system and abdomen: Secondary | ICD-10-CM

## 2018-07-25 DIAGNOSIS — J986 Disorders of diaphragm: Secondary | ICD-10-CM

## 2018-07-25 DIAGNOSIS — I10 Essential (primary) hypertension: Secondary | ICD-10-CM

## 2018-07-25 DIAGNOSIS — R600 Localized edema: Secondary | ICD-10-CM

## 2018-07-25 NOTE — Nursing Note (Signed)
Pt is here for a 2 week follow up exam.

## 2018-07-25 NOTE — Telephone Encounter (Signed)
Patient was seen in the office and he is aware.

## 2018-07-25 NOTE — Progress Notes (Signed)
Thomas [name] is a 73 y.o. male who complains of Follow-up  .    HPI:  Patient presents to the office today for follow-up.  He was last evaluated on July 11, 2018 20. He came in that day with complaints of intermittent shortness of breath, pedal edema and weight gain for the preceding 2 weeks.  He reported that he felt like his abdomen was distended at times.  He felt that his legs were more swollen than before.  Amlodipine 5 mg daily was added to his regimen back in September.  That coincided with when he started to notice the increased edema.  I advised that he discontinue amlodipine and that he continue Lasix 40 mg daily and potassium 10 mEq daily.  I advised that he proceed with lab work.  He did have a normal BM P including BUN and creatinine 14 and 0.9, respectively, on July 14, 2018. Also, BNP was normal at 37.8.  Chest x-ray was normal other than elevation of the left hemidiaphragm which was new compared to a prior chest radiograph.  He was advised to proceed with CT abdomen pelvis for further evaluation, but he did not return our calls regarding this.  He presents today stating that he continues to notice intermittent sob and abdominal bloating.  The bloating is worse after eating.  He denies abdominal pain.  He had an episode of diarrhea x 5 days.  He admits that he gets constipated and his sob and bloating seem better after a BM.  He denies chest pain, vomiting, rectal bleeding.  He states that the leg swelling has resolved.   He has not had a GI consult were colonoscopy in the past.  He had cologard 05-27-17 and this was neg.  He did have PFTs August 2019 and there is mild airflow obstruction.\    REVIEW OF SYSTEMS:  GENERAL: negative for fatigue, significant weight change, sleep disturbance  HEENT: No recent visual or hearing changes.   RESPIRATORY: No SOB, cough, wheezes  CARDIAC: No chest pains; no palpitations; no edema  GI: no N/V/D, no abdominal pain.  BMs regular  MUSCULOSKELETAL: no  myalgias, no joint pain, no edema or erythema  NEUROLOGIC: no headache, weakness, numbness or tingling  GU: no urinary sxs or flank pain  SKIN:  negative for rashes.      No Known Allergies  Current Outpatient Medications   Medication Sig   . acetaminophen (TYLENOL) 500 mg Oral Tablet Take 500 mg by mouth Every 4 hours as needed for Pain (pt takes tylenol pm)   . apixaban (ELIQUIS) 5 mg Oral Tablet Take 5 mg by mouth   . furosemide (LASIX) 40 mg Oral Tablet Take 1 Tab (40 mg total) by mouth Once a day   . lisinopril (PRINIVIL) 40 mg Oral Tablet TAKE ONE TABLET BY MOUTH EVERY DAY   . lisinopril (PRINIVIL) 40 mg Oral Tablet Take 40 mg by mouth   . metoprolol tartrate (LOPRESSOR) 25 mg Oral Tablet take 1/2 tablet by mouth twice daily.   . nitroGLYCERIN (NITROSTAT) 0.4 mg Sublingual Tablet, Sublingual 0.4 mg by Sublingual route   . potassium chloride (KLOR-CON) 10 mEq Oral Tablet Sustained Release Take 10 mEq by mouth   . simvastatin (ZOCOR) 20 mg Oral Tablet Take 20 mg by mouth Every evening   . VITAMIN E ORAL Take 800 mg by mouth      Past Medical History:   Diagnosis Date   . Arthritis    . Dysuria    .  Enteritis due to Rotavirus    . Gout    . Headache    . Hemorrhoid    . Hypercholesterolemia    . Hypertension          Patient Active Problem List   Diagnosis   . Atrial flutter (CMS HCC)   . Body mass index (BMI) of 33.0 to 33.9 in adult   . Chest tightness   . Essential hypertension   . Hyperlipidemia   . Left bundle branch hemiblock     Past Surgical History:   Procedure Laterality Date   . HX HEMORRHOIDECTOMY           Social History     Tobacco Use   . Smoking status: Former Smoker     Years: 60.00     Types: Cigarettes, Cigars   . Smokeless tobacco: Current User     Types: Chew, Snuff   Substance Use Topics   . Alcohol use: Never     Frequency: Never      Family Medical History:     Problem Relation (Age of Onset)    Cancer Mother    Hypertension (High Blood Pressure) Mother, Father              PHYSICAL EXAM:     The patient appears to be in no acute distress.  Vitals: BP 120/74 (Site: Left, Patient Position: Sitting)   Pulse 55   Temp 36.6 C (97.8 F) (Oral)   Ht 1.791 m (5' 10.5")   Wt 112.5 kg (248 lb)   SpO2 96%   BMI 35.08 kg/m       HEENT:   Head Normocephalic. No masses, lesions, tenderness or abnormalities  Eyes: PERRLA, EOMI, conjunctiva wnl.  Bilateral fundoscopic exams wnl.  Ears: Bilateral TMs intact and clear.  Nose: Bilateral nares patent  Throat: Pharynx clear. No lymphadenopathyor thyromegaly.  RESPIRATORY: CTA, no resp distress, equal BS  HEART: Regular rate and rhythm, no murmur, no edema, pedal pulses 2+  GI:  BS present in 4 quadrants.  No tenderness, masses, organomegaly.  Abdomen soft.  EXTREMITIES: no edema, erythema or tenderness  NEURO: AAOx3, CN's intact, DTR's 2/4 throughout, Romberg negative    ASSESSMENT:     ICD-10-CM    1. Elevated hemidiaphragm J98.6    2. Abdominal bloating R14.0 OUTSIDE CONSULT/REFERRAL PROVIDER(AMB)   3. Pedal edema R60.0    4. Essential hypertension I10    5. Alternating constipation and diarrhea R19.8 OUTSIDE CONSULT/REFERRAL PROVIDER(AMB)   6. Shortness of breath R06.02          PLAN:   I advised that he remain off of amlodipine.  Continue Lasix and potassium daily.  Report any new or worsening symptoms in the meantime.  Proceed with CT abd/pelvis.  Proceed with GI consult.  Return to the office in 2 weeks for a nurse visit for blood pressure check and I advised that he bring his blood pressure cuff with him for comparison.  Orders Placed This Encounter   . OUTSIDE CONSULT/REFERRAL PROVIDER(AMB)   . CANCELED: Pulmonary Function Testing-Adult

## 2018-07-25 NOTE — Telephone Encounter (Signed)
Patient scheduled at [facility] for CT Abd/pelvis on 08-08-18 @ 9 am. 3 hr fast. Script faxed and also given to patient at check out. Per patient left a detailed message with appt.

## 2018-07-25 NOTE — Patient Instructions (Addendum)
Bring blood pressure cuff to next visit.  No PFTs- order cancelled.

## 2018-07-27 ENCOUNTER — Ambulatory Visit: Payer: Medicare HMO | Admitting: Family Medicine

## 2018-07-27 DIAGNOSIS — M1712 Unilateral primary osteoarthritis, left knee: Secondary | ICD-10-CM | POA: Diagnosis not present

## 2018-07-27 NOTE — Patient Instructions (Addendum)
Good to see you  Ice is your friend  Stay active  Will take 4 weeks to work  Add tart cherry extract 1200mg  at night to help with the gout  Cut the HCTZ in 1/2 to see if help some of the aches and pains. Try the horizant and take 1 pill at night and stop the gabapentin for now  See me again in 3 weeks

## 2018-07-27 NOTE — Progress Notes (Signed)
[name] [address] [place], [place] [address] Phone: [phone] Subjective:   Tanner [name], am serving as a scribe for Dr. Hulan Saas.   CC: Left knee pain  FKC:[id number]   06/30/2018: Worsening arthritis.  Given a steroid injection today.  We will see if we get approval for the possibility of Visco supplementation.  Has been 6 months, actually 7 months since the last injection of Visco supplementation.  We will follow-up and see how patient responds  Update 07/27/2018: Tanner [name] is a 73 y.o. male coming in with complaint of left knee pain. Pain occurs with weight bearing. Pain subsides with sedentary activity.  Patient does have known arthritic changes.  Patient is here for another injection.       Past Medical History:  Diagnosis Date  . Acute kidney failure ([place])   . Arthritis   . Back pain   . BPH (benign prostatic hypertrophy)   . Clostridium difficile infection   . Clotting disorder ([place])   . Depression   . Diabetes ([place]) 12/11/2016  . DVT (deep venous thrombosis) ([place])   . DVT of deep femoral vein, right ([place]) 06/29/2018  . Hypertension   . Low back pain potentially associated with spinal stenosis   . Neuromuscular disorder ([place])   . Neuropathy of lower extremity    bilateral  . OSA (obstructive sleep apnea)   . PE (pulmonary embolism)   . Ventral hernia    Past Surgical History:  Procedure Laterality Date  . EYE SURGERY    . HERNIA REPAIR     Social History   Socioeconomic History  . Marital status: Married    Spouse name: Not on file  . Number of children: 2  . Years of education: Not on file  . Highest education level: Not on file  Occupational History  . Occupation: Retired  Scientific laboratory technician  . Financial resource strain: Not on file  . Food insecurity:    Worry: Not on file    Inability: Not on file  . Transportation needs:    Medical: Not on file    Non-medical: Not on file  Tobacco Use  . Smoking  status: Former Smoker    Packs/day: 2.00    Years: 33.00    Pack years: 66.00    Types: Cigarettes    Start date: 08/30/1968    Last attempt to quit: 07/02/2001    Years since quitting: 17.0  . Smokeless tobacco: Never Used  . Tobacco comment: quit 12 years ago  Substance and Sexual Activity  . Alcohol use: Yes    Comment: occasional  . Drug use: [name]  . Sexual activity: Not Currently  Lifestyle  . Physical activity:    Days per week: Not on file    Minutes per session: Not on file  . Stress: Not on file  Relationships  . Social connections:    Talks on phone: Not on file    Gets together: Not on file    Attends religious service: Not on file    Active member of club or organization: Not on file    Attends meetings of clubs or organizations: Not on file    Relationship status: Not on file  Other Topics Concern  . 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    Allergies  Allergen Reactions  . Codeine Sulfate Itching and Nausea Only   Family History  Problem Relation Age of Onset  . Diabetes Mother   . Pneumonia Mother   . Alzheimer's disease Mother   . Other Father 67       Drowned on boating accident  . Colon cancer Neg Hx   . Colon polyps Neg Hx      Current Outpatient Medications (Cardiovascular):  .  atenolol (TENORMIN) 25 MG tablet, Take 12.5 mg by mouth daily. .  hydrochlorothiazide (HYDRODIURIL) 25 MG tablet, Take 25 mg by mouth daily.   Current Outpatient Medications (Analgesics):  .  acetaminophen (TYLENOL) 500 MG tablet, Take 1,000 mg by mouth as needed. Marland Kitchen  allopurinol (ZYLOPRIM) 100 MG tablet, Take 2 tablets (200 mg total) by mouth daily. .  traMADol (ULTRAM) 50 MG tablet, Take 100 mg by mouth 3 (three) times daily as needed.   Current Outpatient Medications (Hematological):  .  apixaban (ELIQUIS) 5  MG TABS tablet, Take 1 tablet (5 mg total) by mouth 2 (two) times daily.  Current Outpatient Medications (Other):  Marland Kitchen  buPROPion (WELLBUTRIN SR) 150 MG 12 hr tablet, Take 150 mg by mouth 2 (two) times daily. .  finasteride (PROSCAR) 5 MG tablet, Take 5 mg by mouth at bedtime. .  gabapentin (NEURONTIN) 300 MG capsule, 600 mg. Take 2 tablets three daily AND 300 MG AT BEDTIME .  ketoconazole (NIZORAL) 2 % shampoo, Apply topically daily. .  Menthol-Methyl Salicylate (THERA-GESIC PLUS) CREA, Apply 1 application topically at bedtime. Apply to lower back .  Multiple Vitamins-Minerals (MULTIVITAMINS THER. W/MINERALS) TABS, Take 1 tablet by mouth daily. .  nortriptyline (PAMELOR) 10 MG capsule, 2 CAPSULES EVERY NIGHT AT BEDTIME .  saccharomyces boulardii (FLORASTOR) 250 MG capsule, Take 1 capsule (250 mg total) by mouth 2 (two) times daily. Marland Kitchen  zolpidem (AMBIEN) 10 MG tablet, Take 1 tablet (10 mg total) by mouth at bedtime. For sleep    Past medical history, social, surgical and family history all reviewed in electronic medical record.  [name] pertanent information unless stated regarding to the chief complaint.   Review of Systems:  [name] headache, visual changes, nausea, vomiting, diarrhea, constipation, dizziness, abdominal pain, skin rash, fevers, chills, night sweats, weight loss, swollen lymph nodes, body aches, joint swelling, , chest pain, shortness of breath, mood changes.  Positive muscle aches  Objective  Blood pressure 132/62, pulse 90, height 5\' 7"  (1.702 m), weight 274 lb (124.3 kg), SpO2 92 %.   General: [name] apparent distress alert and oriented x3 mood and affect normal, dressed appropriately.  HEENT: Pupils equal, extraocular movements intact  Respiratory: Patient's speak in full sentences and does not appear short of breath  Cardiovascular: [name] lower extremity edema, non tender, [name] erythema  Skin: Warm dry intact with [name] signs of infection or rash on extremities or on axial skeleton.    Abdomen: Soft nontender  Neuro: Cranial nerves II through XII are intact, neurovascularly intact in all extremities with 2+ DTRs and 2+ pulses.  Lymph: [name] lymphadenopathy of posterior or anterior cervical chain or axillae bilaterally.  Gait antalgic gait MSK:  tender with right knee mild limited range of motion and good stability and symmetric strength and tone of elbows, wrist, hip, knee and ankles bilaterally.  Knee: Left valgus deformity noted.  Normal thigh to calf ratio.  Tender to palpation over medial and PF joint line.  ROM full  in flexion and extension and lower leg rotation. instability with valgus force.  painful patellar compression. Patellar glide with moderate crepitus. Patellar and quadriceps tendons unremarkable. Hamstring and quadriceps strength is normal. Contralateral knee shows Right knee has some arthritic changes  After informed written and verbal consent, patient was seated on exam table. Left knee was prepped with alcohol swab and utilizing anterolateral approach, patient's left knee space was injected with 22mg /mL of Monovisc (sodium hyaluronate) in a prefilled syringe was injected easily into the knee through a 22-gauge needle..Patient tolerated the procedure well without immediate complications.    Impression and Recommendations:     This case required medical decision making of moderate complexity. The above documentation has been reviewed and is accurate and complete Lyndal Pulley, DO       Note: This dictation was prepared with Dragon dictation along with smaller phrase technology. Any transcriptional errors that result from this process are unintentional.

## 2018-07-27 NOTE — Assessment & Plan Note (Signed)
Patient does have arthritic changes.  Viscosupplementation given today.  Discussed icing regimen and home exercise.  Follow-up again in 4 to 8 weeks

## 2018-08-08 ENCOUNTER — Ambulatory Visit (INDEPENDENT_AMBULATORY_CARE_PROVIDER_SITE_OTHER): Payer: No Typology Code available for payment source

## 2018-08-08 ENCOUNTER — Other Ambulatory Visit: Payer: Self-pay

## 2018-08-08 ENCOUNTER — Telehealth (INDEPENDENT_AMBULATORY_CARE_PROVIDER_SITE_OTHER): Payer: Self-pay | Admitting: Nurse Practitioner

## 2018-08-08 VITALS — BP 118/60 | HR 58 | Resp 18

## 2018-08-08 DIAGNOSIS — Z0130 Encounter for examination of blood pressure without abnormal findings: Secondary | ICD-10-CM

## 2018-08-08 NOTE — Nursing Note (Signed)
Patient presented for BP check.  Patient reports he started Amlodipine again approximately 5 days ago due to BP running high- had previously been advised to hold.  Denies any s/s hypo/hypertension.  In office VS:   126/64 (LT- sitting)  118/60 (RT- sitting)  HR- 58, SAO2-97% RA    Pt did bring home BP cuff- 131/72 (left sitting)    Please refer to telephone encounter for plan of treatment.

## 2018-08-08 NOTE — Telephone Encounter (Addendum)
Patient presented for BP check.  Patient reports he started Amlodipine again approximately 5 days ago due to BP running high- had previously been advised to hold.  Denies any s/s hypo/hypertension.  In office VS:   126/64 (LT- sitting)  118/60 (RT- sitting)  HR- 58, SAO2-97% RA    Pt did bring home BP cuff- 131/72 (left sitting)  Home BP log scanned for review    Please advise if any changes

## 2018-08-08 NOTE — Telephone Encounter (Signed)
I am concerned that he restarted amlodipine because he had experienced pedal edema from this previously.  Also, his BP was 120/74 at his last ov and this was while he was not taking it.  I am concerned that his diastolic BP is now in the 60s with the amlodipine back on board.  Also, he may start to notice edema again.  His BP cuff reads higher than our readings.  I recommend that he d/c the amlodipine.  Continue lasix and K+.  Come in for nurse visit for BP check in 1 week.

## 2018-08-08 NOTE — Telephone Encounter (Signed)
Left message for patient to return call to office. 

## 2018-08-09 ENCOUNTER — Other Ambulatory Visit (INDEPENDENT_AMBULATORY_CARE_PROVIDER_SITE_OTHER): Payer: Self-pay

## 2018-08-09 DIAGNOSIS — J986 Disorders of diaphragm: Secondary | ICD-10-CM

## 2018-08-09 DIAGNOSIS — R9389 Abnormal findings on diagnostic imaging of other specified body structures: Secondary | ICD-10-CM

## 2018-08-10 ENCOUNTER — Telehealth (INDEPENDENT_AMBULATORY_CARE_PROVIDER_SITE_OTHER): Payer: Self-pay | Admitting: Nurse Practitioner

## 2018-08-10 DIAGNOSIS — J9811 Atelectasis: Secondary | ICD-10-CM

## 2018-08-10 DIAGNOSIS — J986 Disorders of diaphragm: Secondary | ICD-10-CM

## 2018-08-10 DIAGNOSIS — R0602 Shortness of breath: Secondary | ICD-10-CM

## 2018-08-10 NOTE — Telephone Encounter (Signed)
Spoke with the patient about stopping the amlodipine and just continuing with the lasix and K+. He states he did not have any swelling with the amlodipine just some shortness of Breath and admits he actually felt he had less swelling with this medication. He is aware that his diastolic number is a bit low in 60s and that his home BP cuff reds higher. He is not able to schedule to come back in the office for a BP check at this time but will keep a log of his BP numbers and will cal the office.

## 2018-08-10 NOTE — Telephone Encounter (Signed)
Please notify the patient that his CT abdomen does show elevation of the left hemidiaphragm and a decrease in lung volume in the left lower lobe.  I did consult with Dr. Gordan Payment and she did advised to proceed with referral to pulmonology as well as to check CT chest.  Ordered.

## 2018-08-12 NOTE — Telephone Encounter (Signed)
Patient aware of results below. Patient would like to be scheduled at [facility] for CT chest. He is requesting a morning appt around 10 am. No earlier. Patient also requesting to see [facility] pulmonology. He is aware we fax form and they will call him.

## 2018-08-12 NOTE — Telephone Encounter (Signed)
Lm at [phone] for pt to return call for test results.

## 2018-08-12 NOTE — Telephone Encounter (Signed)
Faxed paperwork to [facility] and they will call patient to schedule.     Patient scheduled at [facility] for CT Chest on 08-22-18 @ 2:15 pm. Script faxed and confirmation received. Per patient left a detailed message with appt.

## 2018-08-22 ENCOUNTER — Ambulatory Visit: Payer: Medicare HMO | Admitting: Family Medicine

## 2018-08-24 ENCOUNTER — Other Ambulatory Visit (INDEPENDENT_AMBULATORY_CARE_PROVIDER_SITE_OTHER): Payer: Self-pay

## 2018-08-24 ENCOUNTER — Telehealth (INDEPENDENT_AMBULATORY_CARE_PROVIDER_SITE_OTHER): Payer: Self-pay | Admitting: Nurse Practitioner

## 2018-08-24 DIAGNOSIS — J9811 Atelectasis: Secondary | ICD-10-CM

## 2018-08-24 DIAGNOSIS — J986 Disorders of diaphragm: Secondary | ICD-10-CM

## 2018-08-24 DIAGNOSIS — R0602 Shortness of breath: Secondary | ICD-10-CM

## 2018-08-24 NOTE — Telephone Encounter (Signed)
Please notify the patient that this CT chest is normal other than continued elevation of the left hemidiaphragm and left lower lobe atelectasis.  He was referred to pulmonology for this in his symptoms of shortness of breath previously.  Please make sure he is scheduled and follows through with the pulmonology consult.  I consulted with Dr. Gordan Payment regarding the patient's symptoms and test results and she does agree.

## 2018-08-25 NOTE — Telephone Encounter (Signed)
Pt aware of CT results, is waiting for pulm to return call to schedule.

## 2018-08-25 NOTE — Telephone Encounter (Signed)
Lm to r.c

## 2018-08-29 ENCOUNTER — Ambulatory Visit: Payer: Medicare HMO | Admitting: Family Medicine

## 2018-09-01 ENCOUNTER — Telehealth (INDEPENDENT_AMBULATORY_CARE_PROVIDER_SITE_OTHER): Payer: Self-pay | Admitting: Nurse Practitioner

## 2018-09-01 NOTE — Telephone Encounter (Signed)
Spoke with Thomas [name] scheduling he is scheduled at [facility] across [facility] [place] on 12/22/18 at 3:25pm. They are mailing him out his appointment information.

## 2018-09-01 NOTE — Telephone Encounter (Signed)
I called and spoke to the patient.  I did review all of his test results.  I looked over the CT chest with him and explained that he does have the left lower lobe atelectasis and continued elevation of the left hemidiaphragm.  He does have some old healing left rib fractures there.  This could be associated with what's causing those findings on the CT.  He denies any new or worsening symptoms.  He denies any current problems, but states that he has not yet been contacted from pulmonology.  He was referred to pulmonology previously.  Please reach out to find out if they are going to schedule the patient.

## 2018-09-01 NOTE — Telephone Encounter (Signed)
Patient wanted to speak to you and has some questions about his elevated diaphragm. He had questions on if this was treatable or if he would have it for the rest of his life. Also if he would be able to still do sports such as golfing. If it would affect his condition. He can be reached at [phone].

## 2018-09-06 ENCOUNTER — Ambulatory Visit: Payer: Medicare HMO | Admitting: Family Medicine

## 2018-09-06 ENCOUNTER — Other Ambulatory Visit: Payer: Self-pay

## 2018-09-06 DIAGNOSIS — M1712 Unilateral primary osteoarthritis, left knee: Secondary | ICD-10-CM

## 2018-09-06 NOTE — Assessment & Plan Note (Signed)
Repeat injection given today.  Tolerated the procedure well.  Failed viscosupplementation, discussed PRP patient wants to hold on that at the moment.  Discussed icing regimen and home exercise.  Discussed which activities to do which wants to avoid.  Follow-up with me again 4 to 8 weeks

## 2018-09-06 NOTE — Progress Notes (Signed)
[name] [address] [place], [place] [address] Phone: [phone] Subjective:   [name], am serving as a scribe for Dr. Hulan Saas.  I'm seeing this patient by the request  of:    CC: Left knee pain follow-up  KCM:[id number]   07/27/2018: Patient does have arthritic changes.  Viscosupplementation given today.  Discussed icing regimen and home exercise.  Follow-up again in 4 to 8 weeks  Update 09/06/2018: Tanner [name] is a 73 y.o. male coming in with complaint of left knee pain. Patient states that his pain has increased. Injection last visit only provided him with 2 days of relief.      Past Medical History:  Diagnosis Date  . Acute kidney failure ([place])   . Arthritis   . Back pain   . BPH (benign prostatic hypertrophy)   . Clostridium difficile infection   . Clotting disorder ([place])   . Depression   . Diabetes ([place]) 12/11/2016  . DVT (deep venous thrombosis) ([place])   . DVT of deep femoral vein, right ([place]) 06/29/2018  . Hypertension   . Low back pain potentially associated with spinal stenosis   . Neuromuscular disorder ([place])   . Neuropathy of lower extremity    bilateral  . OSA (obstructive sleep apnea)   . PE (pulmonary embolism)   . Ventral hernia    Past Surgical History:  Procedure Laterality Date  . EYE SURGERY    . HERNIA REPAIR     Social History   Socioeconomic History  . Marital status: Married    Spouse name: Not on file  . Number of children: 2  . Years of education: Not on file  . Highest education level: Not on file  Occupational History  . Occupation: Retired  Scientific laboratory technician  . Financial resource strain: Not on file  . Food insecurity:    Worry: Not on file    Inability: Not on file  . Transportation needs:    Medical: Not on file    Non-medical: Not on file  Tobacco Use  . Smoking status: Former Smoker    Packs/day: 2.00    Years: 33.00    Pack years: 66.00    Types: Cigarettes    Start date:  08/30/1968    Last attempt to quit: 07/02/2001    Years since quitting: 17.1  . Smokeless tobacco: Never Used  . Tobacco comment: quit 12 years ago  Substance and Sexual Activity  . Alcohol use: Yes    Comment: occasional  . Drug use: No  . Sexual activity: Not Currently  Lifestyle  . Physical activity:    Days per week: Not on file    Minutes per session: Not on file  . Stress: Not on file  Relationships  . Social connections:    Talks on phone: Not on file    Gets together: Not on file    Attends religious service: Not on file    Active member of club or organization: Not on file    Attends meetings of clubs or organizations: Not on file    Relationship status: Not on file  Other Topics Concern  . 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    Allergies  Allergen Reactions  .  Codeine Sulfate Itching and Nausea Only   Family History  Problem Relation Age of Onset  . Diabetes Mother   . Pneumonia Mother   . Alzheimer's disease Mother   . Other Father 66       Drowned on boating accident  . Colon cancer Neg Hx   . Colon polyps Neg Hx      Current Outpatient Medications (Cardiovascular):  .  atenolol (TENORMIN) 25 MG tablet, Take 12.5 mg by mouth daily. .  hydrochlorothiazide (HYDRODIURIL) 25 MG tablet, Take 25 mg by mouth daily.   Current Outpatient Medications (Analgesics):  .  acetaminophen (TYLENOL) 500 MG tablet, Take 1,000 mg by mouth as needed. Marland Kitchen  allopurinol (ZYLOPRIM) 100 MG tablet, Take 2 tablets (200 mg total) by mouth daily. .  traMADol (ULTRAM) 50 MG tablet, Take 100 mg by mouth 3 (three) times daily as needed.   Current Outpatient Medications (Hematological):  .  apixaban (ELIQUIS) 5 MG TABS tablet, Take 1 tablet (5 mg total) by mouth 2 (two) times daily.  Current Outpatient Medications (Other):   Marland Kitchen  buPROPion (WELLBUTRIN SR) 150 MG 12 hr tablet, Take 150 mg by mouth 2 (two) times daily. .  finasteride (PROSCAR) 5 MG tablet, Take 5 mg by mouth at bedtime. .  gabapentin (NEURONTIN) 300 MG capsule, 600 mg. Take 2 tablets three daily AND 300 MG AT BEDTIME .  ketoconazole (NIZORAL) 2 % shampoo, Apply topically daily. .  Menthol-Methyl Salicylate (THERA-GESIC PLUS) CREA, Apply 1 application topically at bedtime. Apply to lower back .  Multiple Vitamins-Minerals (MULTIVITAMINS THER. W/MINERALS) TABS, Take 1 tablet by mouth daily. .  nortriptyline (PAMELOR) 10 MG capsule, 2 CAPSULES EVERY NIGHT AT BEDTIME .  saccharomyces boulardii (FLORASTOR) 250 MG capsule, Take 1 capsule (250 mg total) by mouth 2 (two) times daily. Marland Kitchen  zolpidem (AMBIEN) 10 MG tablet, Take 1 tablet (10 mg total) by mouth at bedtime. For sleep    Past medical history, social, surgical and family history all reviewed in electronic medical record.  No pertanent information unless stated regarding to the chief complaint.   Review of Systems:  No headache, visual changes, nausea, vomiting, diarrhea, constipation, dizziness, abdominal pain, skin rash, fevers, chills, night sweats, weight loss, swollen lymph nodes, body aches, joint swelling,chest pain, shortness of breath, mood changes.  Positive muscle aches  Objective  Blood pressure (!) 118/52, pulse 75, height 5\' 7"  (1.702 m), weight 274 lb (124.3 kg), SpO2 91 %.   General: No apparent distress alert and oriented x3 mood and affect normal, dressed appropriately.  Face asymmetry noted HEENT: Pupils equal, extraocular movements intact  Respiratory: Patient's speak in full sentences and does not appear short of breath  Cardiovascular: 1+ lower extremity edema, non tender, no erythema  Skin: Warm dry intact with no signs of infection or rash on extremities or on axial skeleton.  Abdomen: Soft nontender  Neuro: Cranial nerves II through XII are intact, neurovascularly intact in  all extremities with 2+ DTRs and 2+ pulses.  Lymph: No lymphadenopathy of posterior or anterior cervical chain or axillae bilaterally.  Gait antalgic MSK:  Non tender with full range of motion and good stability and symmetric strength and tone of shoulders, elbows, wrist, hip and ankles bilaterally.  Knee: Left valgus deformity noted. Large thigh to calf ratio.  Tender to palpation over medial and PF joint line.  ROM full in flexion and extension and lower leg rotation. instability with valgus force.  painful patellar compression. Patellar glide with moderate  crepitus. Patellar and quadriceps tendons unremarkable. Hamstring and quadriceps strength is normal. Contralateral knee shows arthritic changes but no instability  After informed written and verbal consent, patient was seated on exam table. Left knee was prepped with alcohol swab and utilizing anterolateral approach, patient's left knee space was injected with 4:1  marcaine 0.5%: Kenalog 40mg /dL. Patient tolerated the procedure well without immediate complications.    Impression and Recommendations:     . The above documentation has been reviewed and is accurate and complete Lyndal Pulley, DO       Note: This dictation was prepared with Dragon dictation along with smaller phrase technology. Any transcriptional errors that result from this process are unintentional.

## 2018-09-06 NOTE — Patient Instructions (Addendum)
Good to see you  Sorry injection did not help  Steroid injection today  Ice is your friend Stay active but wear the brace a little more  Go home and be safe See me again in 6-8 weeks

## 2018-09-07 ENCOUNTER — Other Ambulatory Visit (INDEPENDENT_AMBULATORY_CARE_PROVIDER_SITE_OTHER): Payer: Self-pay | Admitting: Family Medicine

## 2018-09-07 MED ORDER — LISINOPRIL 40 MG TABLET
40.0000 mg | ORAL_TABLET | Freq: Every day | ORAL | 1 refills | Status: DC
Start: 2018-09-07 — End: 2018-12-02

## 2018-09-07 NOTE — Telephone Encounter (Signed)
Reviewed active medication list.  Parameters for refill met per departmental refill protocol.  Refill sent electronically to pharmacy as noted in the signed orders section of this encounter.  Order forwarded to provider for co-signature.

## 2018-09-21 ENCOUNTER — Encounter (INDEPENDENT_AMBULATORY_CARE_PROVIDER_SITE_OTHER): Payer: Self-pay | Admitting: Family Medicine

## 2018-09-28 ENCOUNTER — Encounter (INDEPENDENT_AMBULATORY_CARE_PROVIDER_SITE_OTHER): Payer: Self-pay | Admitting: Family Medicine

## 2018-10-20 ENCOUNTER — Other Ambulatory Visit (INDEPENDENT_AMBULATORY_CARE_PROVIDER_SITE_OTHER): Payer: Self-pay | Admitting: Nurse Practitioner

## 2018-10-20 MED ORDER — FUROSEMIDE 40 MG TABLET
40.0000 mg | ORAL_TABLET | Freq: Every day | ORAL | 1 refills | Status: DC
Start: 2018-10-20 — End: 2019-03-24

## 2018-10-20 MED ORDER — POTASSIUM CHLORIDE ER 10 MEQ TABLET,EXTENDED RELEASE
10.00 meq | ORAL_TABLET | Freq: Two times a day (BID) | ORAL | 1 refills | Status: DC
Start: 2018-10-20 — End: 2019-03-24

## 2018-10-20 NOTE — Telephone Encounter (Signed)
Reviewed active medication list.  Parameters for refill met per departmental refill protocol.  Refill sent electronically to pharmacy as noted in the signed orders section of this encounter.  Order forwarded to provider for co-signature.

## 2018-11-02 ENCOUNTER — Telehealth: Payer: Self-pay | Admitting: *Deleted

## 2018-11-02 NOTE — Telephone Encounter (Signed)
[id number]/tct-home phone/breavment call due to the passing of the wife Tanner [name] this am.  Condolences given ands offered help in whatever way is needed.Tanner [name],BSN,RN3,CCM,CN

## 2018-11-16 ENCOUNTER — Other Ambulatory Visit (INDEPENDENT_AMBULATORY_CARE_PROVIDER_SITE_OTHER): Payer: Self-pay

## 2018-11-16 DIAGNOSIS — I10 Essential (primary) hypertension: Secondary | ICD-10-CM

## 2018-11-16 DIAGNOSIS — E782 Mixed hyperlipidemia: Secondary | ICD-10-CM

## 2018-11-16 LAB — BASIC METABOLIC PANEL, FASTING
BUN: 16
CARBON DIOXIDE: 31
CHLORIDE: 104
GLUCOSE, FASTING: 101
POTASSIUM: 3.9
SODIUM: 141

## 2018-11-16 LAB — LIPID PANEL
CHOLESTEROL: 151
HDL-CHOLESTEROL: 52
LDL (CALCULATED): 87
TRIGLYCERIDES: 58

## 2018-11-16 LAB — ALT (SGPT): ALT (SGPT): 17

## 2018-11-16 LAB — AST (SGOT): AST (SGOT): 19

## 2018-11-21 ENCOUNTER — Telehealth (INDEPENDENT_AMBULATORY_CARE_PROVIDER_SITE_OTHER): Payer: Self-pay | Admitting: Family Medicine

## 2018-11-21 ENCOUNTER — Other Ambulatory Visit (INDEPENDENT_AMBULATORY_CARE_PROVIDER_SITE_OTHER): Payer: Self-pay

## 2018-11-21 ENCOUNTER — Encounter (INDEPENDENT_AMBULATORY_CARE_PROVIDER_SITE_OTHER): Payer: Self-pay | Admitting: Family Medicine

## 2018-11-21 ENCOUNTER — Other Ambulatory Visit: Payer: Self-pay

## 2018-11-21 ENCOUNTER — Ambulatory Visit (INDEPENDENT_AMBULATORY_CARE_PROVIDER_SITE_OTHER): Payer: No Typology Code available for payment source | Admitting: Family Medicine

## 2018-11-21 VITALS — BP 140/108 | HR 51 | Temp 97.3°F | Ht 70.5 in | Wt 251.5 lb

## 2018-11-21 DIAGNOSIS — R0602 Shortness of breath: Secondary | ICD-10-CM

## 2018-11-21 DIAGNOSIS — I10 Essential (primary) hypertension: Principal | ICD-10-CM

## 2018-11-21 DIAGNOSIS — J986 Disorders of diaphragm: Secondary | ICD-10-CM

## 2018-11-21 DIAGNOSIS — R079 Chest pain, unspecified: Secondary | ICD-10-CM

## 2018-11-21 DIAGNOSIS — I4892 Unspecified atrial flutter: Secondary | ICD-10-CM

## 2018-11-21 DIAGNOSIS — E785 Hyperlipidemia, unspecified: Secondary | ICD-10-CM

## 2018-11-21 MED ORDER — DILTIAZEM CD 120 MG CAPSULE,EXTENDED RELEASE 24 HR
120.0000 mg | ORAL_CAPSULE | Freq: Every day | ORAL | 1 refills | Status: DC
Start: 2018-11-21 — End: 2019-01-16

## 2018-11-21 MED ORDER — NITROGLYCERIN 0.4 MG SUBLINGUAL TABLET
0.4000 mg | SUBLINGUAL_TABLET | SUBLINGUAL | 1 refills | Status: DC | PRN
Start: 2018-11-21 — End: 2020-10-18

## 2018-11-21 NOTE — Nursing Note (Signed)
Follow up chronic medical conditions.       X couple weeks, elevated BP and pulse. Irregular heart rates on his home machine.

## 2018-11-21 NOTE — Progress Notes (Signed)
Thomas [name] is a 73 y.o. male who presents today for follow-up of hypertension and hyperlipidemia and chronic medical problems.  The patient reports current adherence to recommended diet is good. The pt is compliant with medicine.  Regarding HTN: The following symptoms are also present: none, and he also denies the following symptoms: headache, blurred vision, chest discomfort, shortness of breath and palpitations.  Side effects of medication:none.  Pt's BP has not always been well controlled at home.  He did bring in a list of his reading and is checks his blood pressure at least 3-4 times a day.  He is having a lot of systolic numbers recently over the last week or so that her in the 140s and 150s and his diastolic has been more often in the 90s lately.  He did not tolerate a beta-blocker.  He is now just on lisinopril 40 mg and Lasix 40 mg. He had been on amlodipine at 1 point but had pedal edema so was stopped by the nurse practitioner.  Regarding lipid treatment: He is taking Zocor and is having no side effects. Last labs were excellent with a total cholesterol 151, triglycerides 58, HDL 52 and LDL 87  Pt having LBP for 10 years.    Pt having SOB, even at rest.  He did have a lot of workup done by the nurse practitioner when she saw him a couple of times this winter.  He was diagnosed with an elevated left hemidiaphragm.  He was referred to GI for this and for alternating constipation and diarrhea.  He did not have an EGD done.  He was supposed to flee see pulmonology but apparently that appointment never happened because of COVID-19.  He states that he short of breath even at rest but he does get short of breath if he eats a large meal or lies down.  He is not having a productive cough or wheezing.  Has taken   He does have a history of atrial flutter and has follow-up with cardiology.  [place] but has not been there in a little while because he says he cannot afford to  go the co-pay was over 200 dollars.  The last office visit note that I could find was from May of 2019 at that time the cardiologist mention that his Holter echo and stress test were ordered and to continue his anticoagulant return in 6 months and they were considering cardioversion.  He states he has had some episode of chest pain lately and has been having to take a few nitroglycerin.  He states that does not even happen on exertion it can be random.  He denies any palpitations, lightheadedness or dizziness.  Again he did just have a stress test done last year and did follow with cardiology but no recent office visits within the last few months.  He is willing to transfer to a cardiologist more local.    REVIEW OF SYSTEMS:  GENERAL: negative for fevers/chills, fatigue, significant weight change, sleep disturbance  HEENT:denies visual changes, sore throat or URI symptoms  RESPIRATORY:shortness of breath as abpve, no cough, or wheeze  CARDIAC:  Chest pains and shortness of breath as above  GI: no nausea/vomitting/diarrhea, no abdominal pain  MUSCULOSKELETAL: no myalgias or arthragias, no recent trauma, no edema   NEUROLOGIC: no headache, visual changes or mental status changes  GU: no urinary symptoms or CVA tenderness      No Known Allergies  Current Outpatient  Medications   Medication Sig   . acetaminophen (TYLENOL) 500 mg Oral Tablet Take 500 mg by mouth Every 4 hours as needed for Pain (pt takes tylenol pm)   . apixaban (ELIQUIS) 5 mg Oral Tablet Take 5 mg by mouth   . dilTIAZem (CARDIZEM CD) 120 mg Oral Capsule, Sust. Release 24 hr Take 1 Cap (120 mg total) by mouth Once a day   . furosemide (LASIX) 40 mg Oral Tablet Take 1 Tab (40 mg total) by mouth Once a day   . lisinopriL (PRINIVIL) 40 mg Oral Tablet Take 1 Tab (40 mg total) by mouth Once a day   . melatonin 5 mg Oral Tablet Take 5 mg by mouth Every night   . nitroGLYCERIN (NITROSTAT) 0.4 mg Sublingual Tablet, Sublingual 1 Tab (0.4 mg total) by Sublingual  route Every 5 minutes as needed for Chest pain   . potassium chloride (KLOR-CON) 10 mEq Oral Tablet Sustained Release Take 1 Tab (10 mEq total) by mouth Twice daily   . simvastatin (ZOCOR) 20 mg Oral Tablet Take 20 mg by mouth Every evening   . VITAMIN E ORAL Take 800 mg by mouth     Past Medical History:   Diagnosis Date   . Arthritis    . Dysuria    . Enteritis due to Rotavirus    . Gout    . Headache    . Hemorrhoid    . Hypercholesterolemia    . Hypertension          Past Surgical History:   Procedure Laterality Date   . HX HEMORRHOIDECTOMY           Social History     Tobacco Use   . Smoking status: Former Smoker     Years: 60.00     Types: Cigarettes, Cigars   . Smokeless tobacco: Current User     Types: Chew, Snuff   Substance Use Topics   . Alcohol use: Yes     Frequency: Never        PHYSICAL EXAM:   The patient appears to be in no acute distress.  Vitals: BP (!) 140/108   Pulse 51   Temp 36.3 C (97.3 F) (Tympanic)   Ht 1.791 m (5' 10.5")   Wt 114 kg (251 lb 8 oz)   SpO2 96%   BMI 35.58 kg/m   Respiratory: clear to ascultation B/L, no respiratory distress, equal BS throughout, no bibasilar rales  Heart:  Irregularly irregular,, no murmur, no edema, normal peripheral pulses  Abdomen: soft, nontender, positive bowel sounds  Extremities: no edema, no calf pain or tenderness  Neuro: AAOx3, cranial nerves's intact, DTR's 2/4 throughout  Skin: warm and dry, no rashes or lesions    ASSESSMENT:   .Marland Kitchen    ICD-10-CM    1. Essential hypertension I10 Continue lisinopril and Lasix.  Add diltiazem at low dose.  He is to watch his heart rate and his blood pressure carefully at and come in for blood pressure check.  He did not tolerate amlodipine secondary to edema and he did not tolerate beta-blockers.   2. Hyperlipidemia E78.5 Lipids are excellent continue current regimen of statin   3. Atrial flutter (CMS HCC) I48.92 OUTSIDE CONSULT/REFERRAL PROVIDER(AMB)-continue anticoagulation and heart rate control.  We will  get him to see a new cardiologist in [place]   4. Shortness of breath R06.02 OUTSIDE CONSULT/REFERRAL PROVIDER(AMB)-the patient still has not been able to get to pulmonology as we will reschedule that today.  5. Elevated hemidiaphragm J98.6 OUTSIDE CONSULT/REFERRAL PROVIDER(AMB)   6. Chest pain R07.9 OUTSIDE CONSULT/REFERRAL PROVIDER(AMB)     PLAN:   I reviewed appropriate screenings and HCM with patient.   Orders Placed This Encounter   . OUTSIDE CONSULT/REFERRAL PROVIDER(AMB)   . OUTSIDE CONSULT/REFERRAL PROVIDER(AMB)   . dilTIAZem (CARDIZEM CD) 120 mg Oral Capsule, Sust. Release 24 hr   . nitroGLYCERIN (NITROSTAT) 0.4 mg Sublingual Tablet, Sublingual     Medication: continue current medication regimen unchanged  Return in about 4 months (around 03/23/2019).

## 2018-11-21 NOTE — Telephone Encounter (Signed)
Scheduled patient with [facility] CR[facility] on Friday June 12,20 @ 9:20. Patient aware.    Faxed all paperwork to [facility] and they will call patient to schedule appt. Patient aware.

## 2018-11-25 ENCOUNTER — Ambulatory Visit (INDEPENDENT_AMBULATORY_CARE_PROVIDER_SITE_OTHER): Payer: No Typology Code available for payment source | Admitting: Family

## 2018-11-25 ENCOUNTER — Encounter (INDEPENDENT_AMBULATORY_CARE_PROVIDER_SITE_OTHER): Payer: Self-pay | Admitting: Family

## 2018-11-25 ENCOUNTER — Other Ambulatory Visit: Payer: Self-pay

## 2018-11-25 ENCOUNTER — Telehealth (INDEPENDENT_AMBULATORY_CARE_PROVIDER_SITE_OTHER): Payer: Self-pay | Admitting: Family

## 2018-11-25 VITALS — BP 134/82 | HR 62 | Ht 70.0 in | Wt 154.0 lb

## 2018-11-25 DIAGNOSIS — I444 Left anterior fascicular block: Secondary | ICD-10-CM

## 2018-11-25 DIAGNOSIS — Z87891 Personal history of nicotine dependence: Secondary | ICD-10-CM

## 2018-11-25 DIAGNOSIS — E782 Mixed hyperlipidemia: Secondary | ICD-10-CM

## 2018-11-25 DIAGNOSIS — R002 Palpitations: Principal | ICD-10-CM

## 2018-11-25 DIAGNOSIS — I4892 Unspecified atrial flutter: Secondary | ICD-10-CM

## 2018-11-25 DIAGNOSIS — R079 Chest pain, unspecified: Secondary | ICD-10-CM

## 2018-11-25 DIAGNOSIS — I10 Essential (primary) hypertension: Secondary | ICD-10-CM

## 2018-11-25 NOTE — Progress Notes (Signed)
Thomas [name] is a 73 y.o. male seen in the office on 11/25/2018    Chief Complaint   Patient presents with   . Follow Up 6 Months     LBBB, HTN, HLD        History of Present Illness:  This is 73 year old male patient with a past medical history of paroxysmal atrial flutter, hypertension, hyperlipidemia, gout, and chronic shortness of breath-patient is to see pulmonology in this regard.  He was seen one time by Dr. Janyth Contes on 10/20/2017 secondary to atrial flutter.  Holter monitor, echocardiogram, and stress test were ordered.  Echocardiogram revealed borderline left ventricle size.  Left ventricle has normal systolic function.  Estimated ejection fraction is 60-65%.  Normal right ventricular size.  Right ventricle has normal function.  Mild mitral regurgitation.  Mild tricuspid regurgitation.  The left atrium is mildly enlarged.  Borderline pulmonary hypertension; estimated pulmonary artery systolic pressure 36 mmHg.  The ascending aorta is dilated at 4 cm.  Diastolic function could not be assessed due to atrial fibrillation.  Stress test revealed resting perfusion abnormality in the inferior apical wall, consistent with prior infarction the right or circumflex coronary artery distribution.  No presence of ischemia was seen.  Holter monitor revealed an average heart rate of 79 beats per minute.  Minimum heart rate was 37 beats per minute, maximum heart rate was 129 beats per minute.  Predominant rhythm was atrial flutter.  There were frequent PVCs.  There were no sustained ventricular arrhythmias.  There were 0 ventricular runs, 4 ventricular triplets, and 3-54 ventricular couplets.  Some of these beats may be aberrantly conducted.  There were no significant pauses.  The longest pause was 1.9 seconds.  A diary was returned without any cardiac symptoms.      Patient presents our office today as a new patient to establish care.  He tells me he never followed up with his prior cardiologist because he was feeling  well.  He now presents with complaints of chest pain and palpitations.  He tells me he began to developed chest pain approximately 2 weeks ago.  He tells me it is midsternal and radiates into his left shoulder and down his left arm.  He has had to take 4 nitroglycerin pills in the past 2 weeks.  He does endorse associated shortness of breath but also states he has chronic shortness of breath.  He tells me the pain lasts approximately 10 minutes and typically is relieved by nitro.  He reports that it seems to occur randomly.  He tells me he was not having chest pain previously.  His palpitations have been coming and going.  He felt as if he may be back in atrial flutter.  EKG today reveals normal sinus rhythm.  He does not have a significant family history of coronary artery disease.  He is a former smoker.  He tells me his PCP recently discontinued him off of metoprolol tartrate because he feels he cannot tolerate beta blockers.  He is on diltiazem.  He is also anticoagulated on Eliquis 5 mg b.i.d. without any signs or symptoms of bleeding.  His vital signs are stable today.        Current Outpatient Medications   Medication Sig   . acetaminophen (TYLENOL) 500 mg Oral Tablet Take 500 mg by mouth Every 4 hours as needed for Pain (pt takes tylenol pm)   . apixaban (ELIQUIS) 5 mg Oral Tablet Take 5 mg by mouth   . dilTIAZem (  CARDIZEM CD) 120 mg Oral Capsule, Sust. Release 24 hr Take 1 Cap (120 mg total) by mouth Once a day   . furosemide (LASIX) 40 mg Oral Tablet Take 1 Tab (40 mg total) by mouth Once a day   . lisinopriL (PRINIVIL) 40 mg Oral Tablet Take 1 Tab (40 mg total) by mouth Once a day   . melatonin 5 mg Oral Tablet Take 5 mg by mouth Every night   . nitroGLYCERIN (NITROSTAT) 0.4 mg Sublingual Tablet, Sublingual 1 Tab (0.4 mg total) by Sublingual route Every 5 minutes as needed for Chest pain   . potassium chloride (KLOR-CON) 10 mEq Oral Tablet Sustained Release Take 1 Tab (10 mEq total) by mouth Twice daily      . simvastatin (ZOCOR) 20 mg Oral Tablet Take 20 mg by mouth Every evening   . VITAMIN E ORAL Take 800 mg by mouth       No Known Allergies  Family Medical History:     Problem Relation (Age of Onset)    Cancer Mother    Hypertension (High Blood Pressure) Mother, Father            Social History     Socioeconomic History   . Marital status: Married     Spouse name: Not on file   . Number of children: Not on file   . Years of education: Not on file   . Highest education level: Not on file   Tobacco Use   . Smoking status: Former Smoker     Years: 60.00     Types: Cigarettes, Cigars   . Smokeless tobacco: Current User     Types: Chew, Snuff   Substance and Sexual Activity   . Alcohol use: Yes     Frequency: Never   . Drug use: Never   . Sexual activity: Not Currently       Review of Systems:   Constitutional:  The patient denies fevers, chills, weight gain or loss, decrease in activity  HEENT:  The patient denies headache, vision disturbance, nose bleeds, ulcerations gingivitis, pharyngitis  Respiratory:  Patient reports he has chronic shortness of breath and will be establishing care with pulmonology.  Denies asthma, no COPD, no cough, sputum production, hemoptysis, or wheezing.  Cardiovascular :  Patient has a history of paroxysmal atrial flutter.  He has a normal sinus rhythm today.  He does present with complaints of chest pain and palpitations.  Patient denies history of CAD, MI, CABG, orthopnea, PND, or edema  Gastrointestinal:  Patient denies nausea, vomiting, diarrhea, blood in stool or constipation  Genitourinary:  Patient denies urgency, frequency, retention, difficulty with stream, hematuria, dysuria  Musculoskeletal:  Patient denies arthritis or myalgias  Neurologic:  No stroke, TIA, paralysis, seizures or tremors  Psychiatric:  No depression, anxiety or increase in stress  Endocrine:  Patient is not diabetic.  Denies polyuria, polydipsia, polyphagia, heat or cold intolerance.  Integumentary:  The patient  denies open areas, rashes, or color changes.    Physical Exam:  BP 134/82   Pulse 62   Ht 1.778 m (5\' 10" )   Wt 69.9 kg (154 lb)   SpO2 97%   BMI 22.10 kg/m       Body mass index is 22.1 kg/m.  Wt Readings from Last 5 Encounters:   11/25/18 69.9 kg (154 lb)   11/21/18 114 kg (251 lb 8 oz)   07/25/18 112 kg (248 lb)   07/11/18 113 kg (249 lb 2  oz)   03/30/18 104 kg (230 lb 6 oz)       General Appearance: well appearing, well nourished, in no apparent distress  Head: normocephalic/atraumatic  Eyes: Sclera nonicteric; conjunctiva normal.  Neck: No jugular venous distention. The carotid upstroke is normal and no bruit is heard.  Cardiovascular: There is no lift or thrill. The rhythm is regular. No significant murmur, gallop, rub, or click is heard.  Respiratory: lungs CTA bilaterally, no wheezes, rales, rhonchi.  Gastrointestinal: abdomen soft and non-tender, no organomegaly or palpable masses; the aorta is not palpable  Musculoskeletal: no kyphosis or scoliosis, gait appears normal, muscle strength was normal with no evidence of muscular atrophy  Skin: no evidence of stasis, dermatitis or ulcers  Neurological: oriented x3 with normal effect  Psychiatric: speech, memory, cognition intact  Hematologic/Lymphatic: no clubbing, cyanosis or edema; peripheral pulses are intact.    PERTINENT DIAGNOSTICS:  EKG today revealed normal sinus rhythm with left anterior fascicular block, probable anterior infarct.  Lipid profile from 6-3 revealed total cholesterol 151, triglycerides 58, HDL 52, LDL 87.     IMPRESSION:  1. Palpitations    2. Paroxysmal atrial flutter    3. Essential hypertension    4. Mixed hyperlipidemia    5. Chest pain, unspecified type    6. Left anterior fascicular block (LAFB)    7. Former smoker          PLAN:  Patient presents to the office today to establish care.  He does have complaints of new onset of chest pain as well as palpitations.  He does have a history of paroxysmal atrial flutter but is in a  normal sinus rhythm today.  He is properly anticoagulated on Eliquis 5 mg b.i.d. without any signs or symptoms of bleeding.  I am recommending as a Lexiscan stress test to be completed to evaluate for ischemia.  I have also ordered a 48 hour Holter monitor to assess for recurrence of atrial flutter.  I did inform patient that if his stress test returns positive, a cardiac catheterization would be indicated at that time.  He verbalized understanding.  No changes have been made to his current medication regimen.  We will phone him with results of his diagnostic testing.  I did encourage him to establish with pulmonology.  He will return to our office in 3 months or sooner if needed.  He will phone our office with any questions or concerns in the meantime.  Dr. Francie MassingAbbasi was readily available for discussion regarding the care of this patient.  Orders Placed This Encounter   . ECG (IN CLINIC)   . HOLTER MONITOR 48 HOURS   . MYOCARDIAL PERFUSION COMPLETE

## 2018-11-25 NOTE — Telephone Encounter (Signed)
11/25/18 Faxed order for Nuclear stress test and 48 hr Holter to [facility] to be scheduled.

## 2018-12-02 ENCOUNTER — Other Ambulatory Visit (INDEPENDENT_AMBULATORY_CARE_PROVIDER_SITE_OTHER): Payer: Self-pay | Admitting: Family Medicine

## 2018-12-02 MED ORDER — SIMVASTATIN 20 MG TABLET
20.00 mg | ORAL_TABLET | Freq: Every evening | ORAL | 1 refills | Status: DC
Start: 2018-12-02 — End: 2019-03-24

## 2018-12-02 MED ORDER — LISINOPRIL 40 MG TABLET
40.0000 mg | ORAL_TABLET | Freq: Every day | ORAL | 1 refills | Status: DC
Start: 2018-12-02 — End: 2019-03-24

## 2018-12-02 NOTE — Telephone Encounter (Signed)
Reviewed active medication list.  Parameters for refill met per departmental refill protocol.  Refill sent electronically to pharmacy as noted in the signed orders section of this encounter.  Order forwarded to provider for co-signature.

## 2018-12-08 ENCOUNTER — Telehealth (INDEPENDENT_AMBULATORY_CARE_PROVIDER_SITE_OTHER): Payer: Self-pay | Admitting: Family

## 2018-12-08 DIAGNOSIS — R9439 Abnormal result of other cardiovascular function study: Secondary | ICD-10-CM

## 2018-12-08 DIAGNOSIS — R0789 Other chest pain: Secondary | ICD-10-CM

## 2018-12-08 NOTE — Telephone Encounter (Signed)
Notified patient of positive stress test. He states he will think about having cath done and will call us back.

## 2018-12-09 ENCOUNTER — Other Ambulatory Visit (INDEPENDENT_AMBULATORY_CARE_PROVIDER_SITE_OTHER): Payer: Self-pay

## 2018-12-09 DIAGNOSIS — R002 Palpitations: Secondary | ICD-10-CM

## 2018-12-09 DIAGNOSIS — R079 Chest pain, unspecified: Secondary | ICD-10-CM

## 2018-12-09 NOTE — Telephone Encounter (Addendum)
Patient agreeable to cath. States he is out of town until July 5, advised to not exert himself and go to nearest ER with any chest pain/pressure/SOB. Education provided, verbalized understanding. Orders placed. He will call the office with any additional questions/concerns.

## 2018-12-12 ENCOUNTER — Telehealth (INDEPENDENT_AMBULATORY_CARE_PROVIDER_SITE_OTHER): Payer: Self-pay | Admitting: Family

## 2018-12-12 NOTE — Telephone Encounter (Signed)
12/12/18 Faxed order for Left heart cath to [facility] to be scheduled. Auth # [id number] expires 02/07/19.

## 2018-12-22 ENCOUNTER — Telehealth (INDEPENDENT_AMBULATORY_CARE_PROVIDER_SITE_OTHER): Payer: Self-pay | Admitting: Family Medicine

## 2018-12-22 ENCOUNTER — Ambulatory Visit (INDEPENDENT_AMBULATORY_CARE_PROVIDER_SITE_OTHER): Payer: Self-pay | Admitting: Internal Medicine

## 2018-12-22 NOTE — Telephone Encounter (Signed)
Diane from [facility] called to report that patient's request to see an out of network pulmonologist has been denied because he does not have out of network benefits and there are pulmonologists within network that the patient may see.     Routing to scheduling as notification.

## 2018-12-22 NOTE — Telephone Encounter (Signed)
Patient aware and will call insurance to see who is in network and will schedule appt himself.

## 2018-12-26 ENCOUNTER — Telehealth (INDEPENDENT_AMBULATORY_CARE_PROVIDER_SITE_OTHER): Payer: Self-pay | Admitting: Cardiovascular Disease

## 2018-12-26 ENCOUNTER — Other Ambulatory Visit (INDEPENDENT_AMBULATORY_CARE_PROVIDER_SITE_OTHER): Payer: Self-pay | Admitting: Cardiovascular Disease

## 2018-12-26 DIAGNOSIS — I4892 Unspecified atrial flutter: Secondary | ICD-10-CM

## 2018-12-26 DIAGNOSIS — E782 Mixed hyperlipidemia: Secondary | ICD-10-CM

## 2018-12-26 DIAGNOSIS — R079 Chest pain, unspecified: Secondary | ICD-10-CM

## 2018-12-26 NOTE — Telephone Encounter (Signed)
12/26/18 Faxed order for TEE and cardioversion to [facility] to be scheduled for next week. No auth needed. Reference # [id number]

## 2018-12-27 ENCOUNTER — Other Ambulatory Visit (INDEPENDENT_AMBULATORY_CARE_PROVIDER_SITE_OTHER): Payer: Self-pay

## 2018-12-27 DIAGNOSIS — R0789 Other chest pain: Secondary | ICD-10-CM

## 2018-12-27 DIAGNOSIS — R9439 Abnormal result of other cardiovascular function study: Secondary | ICD-10-CM

## 2018-12-28 ENCOUNTER — Ambulatory Visit: Payer: Medicare HMO | Admitting: Hematology & Oncology

## 2018-12-28 ENCOUNTER — Other Ambulatory Visit: Payer: Medicare HMO

## 2019-01-04 ENCOUNTER — Inpatient Hospital Stay: Payer: Medicare HMO | Attending: Hematology & Oncology

## 2019-01-04 ENCOUNTER — Encounter: Payer: Self-pay | Admitting: Hematology & Oncology

## 2019-01-04 ENCOUNTER — Inpatient Hospital Stay (HOSPITAL_BASED_OUTPATIENT_CLINIC_OR_DEPARTMENT_OTHER): Payer: Medicare HMO | Admitting: Hematology & Oncology

## 2019-01-04 ENCOUNTER — Other Ambulatory Visit: Payer: Self-pay

## 2019-01-04 VITALS — BP 127/76 | HR 86 | Temp 97.8°F | Resp 20 | Wt 270.0 lb

## 2019-01-04 DIAGNOSIS — E1140 Type 2 diabetes mellitus with diabetic neuropathy, unspecified: Secondary | ICD-10-CM

## 2019-01-04 DIAGNOSIS — I82502 Chronic embolism and thrombosis of unspecified deep veins of left lower extremity: Secondary | ICD-10-CM | POA: Diagnosis not present

## 2019-01-04 DIAGNOSIS — Z7901 Long term (current) use of anticoagulants: Secondary | ICD-10-CM | POA: Insufficient documentation

## 2019-01-04 DIAGNOSIS — E119 Type 2 diabetes mellitus without complications: Secondary | ICD-10-CM | POA: Diagnosis not present

## 2019-01-04 DIAGNOSIS — I2699 Other pulmonary embolism without acute cor pulmonale: Secondary | ICD-10-CM | POA: Insufficient documentation

## 2019-01-04 DIAGNOSIS — I10 Essential (primary) hypertension: Secondary | ICD-10-CM

## 2019-01-04 DIAGNOSIS — Z794 Long term (current) use of insulin: Secondary | ICD-10-CM

## 2019-01-04 DIAGNOSIS — I82411 Acute embolism and thrombosis of right femoral vein: Secondary | ICD-10-CM

## 2019-01-04 LAB — CMP (CANCER CENTER ONLY)
ALT: 15 U/L (ref 0–44)
AST: 16 U/L (ref 15–41)
Albumin: 4.1 g/dL (ref 3.5–5.0)
Alkaline Phosphatase: 82 U/L (ref 38–126)
Anion gap: 9 (ref 5–15)
BUN: 23 mg/dL (ref 8–23)
CO2: 34 mmol/L — ABNORMAL HIGH (ref 22–32)
Calcium: 8.8 mg/dL — ABNORMAL LOW (ref 8.9–10.3)
Chloride: 98 mmol/L (ref 98–111)
Creatinine: 1.41 mg/dL — ABNORMAL HIGH (ref 0.61–1.24)
GFR, Est AFR Am: 56 mL/min — ABNORMAL LOW (ref >60)
GFR, Estimated: 49 mL/min — ABNORMAL LOW (ref >60)
Glucose, Bld: 112 mg/dL — ABNORMAL HIGH (ref 70–99)
Potassium: 3.2 mmol/L — ABNORMAL LOW (ref 3.5–5.1)
Sodium: 141 mmol/L (ref 135–145)
Total Bilirubin: 0.6 mg/dL (ref 0.3–1.2)
Total Protein: 6 g/dL — ABNORMAL LOW (ref 6.5–8.1)

## 2019-01-04 LAB — CBC WITH DIFFERENTIAL (CANCER CENTER ONLY)
Abs Immature Granulocytes: 0.05 10*3/uL (ref 0.00–0.07)
Basophils Absolute: 0.1 10*3/uL (ref 0.0–0.1)
Basophils Relative: 1 %
Eosinophils Absolute: 0.5 10*3/uL (ref 0.0–0.5)
Eosinophils Relative: 4 %
HCT: 46.8 % (ref 39.0–52.0)
Hemoglobin: 15.3 g/dL (ref 13.0–17.0)
Immature Granulocytes: 1 %
Lymphocytes Relative: 25 %
Lymphs Abs: 2.5 10*3/uL (ref 0.7–4.0)
MCH: 29.9 pg (ref 26.0–34.0)
MCHC: 32.7 g/dL (ref 30.0–36.0)
MCV: 91.4 fL (ref 80.0–100.0)
Monocytes Absolute: 0.7 10*3/uL (ref 0.1–1.0)
Monocytes Relative: 7 %
Neutro Abs: 6.4 10*3/uL (ref 1.7–7.7)
Neutrophils Relative %: 62 %
Platelet Count: 210 10*3/uL (ref 150–400)
RBC: 5.12 MIL/uL (ref 4.22–5.81)
RDW: 14.3 % (ref 11.5–15.5)
WBC Count: 10.2 10*3/uL (ref 4.0–10.5)
nRBC: 0 % (ref 0.0–0.2)

## 2019-01-04 LAB — HEMOGLOBIN A1C
Hgb A1c MFr Bld: 5.9 % — ABNORMAL HIGH (ref 4.8–5.6)
Mean Plasma Glucose: 122.63 mg/dL

## 2019-01-04 NOTE — Progress Notes (Signed)
Hematology and Oncology Follow Up Visit  Tanner [name] [id number] 09-Oct-1945 73 y.o. 01/04/2019   Principle Diagnosis:  Recurrent pulmonary emboli/left leg thrombus -- Idiopathic  Current Therapy:   Eliquis 5 mg p.o. twice daily - started 06/29/2017   Interim History: Tanner [name] is here today for follow-up.  Unfortunately, his wife passed away in 2022-11-26.  She really had a tough lung problem.  I think she had pulmonary fibrosis.  He seems to be holding on.  He goes to the [facility] hospital in [place].  Unfortunately, I do not be able to see him quite often because of the coronavirus.  He is having problems with his left knee.  He really needs surgery for this.  He gets injections.  His back is also causing a lot of problems.  His lower back is deteriorating.  I am sure the weight that he has on him is probably a big problem there he says that they have not done any injections because he is on the Eliquis.  I told him that the Eliquis can be stopped 2 days before any injection and he would be safe.  He has had no bleeding.  He has had no cough or chest wall pain.  He has had no change in bowel bladder habits.    Overall, his performance status is ECOG 1.    Medications:  Allergies as of 01/04/2019      Reactions   Codeine Sulfate Itching, Nausea Only      Medication List       Accurate as of January 04, 2019  3:49 PM. If you have any questions, ask your nurse or doctor.        acetaminophen 500 MG tablet Commonly known as: TYLENOL Take 1,000 mg by mouth as needed.   allopurinol 100 MG tablet Commonly known as: ZYLOPRIM Take 2 tablets (200 mg total) by mouth daily.   apixaban 5 MG Tabs tablet Commonly known as: Eliquis Take 1 tablet (5 mg total) by mouth 2 (two) times daily.   atenolol 25 MG tablet Commonly known as: TENORMIN Take 12.5 mg by mouth daily.   buPROPion 150 MG 12 hr tablet Commonly known as: WELLBUTRIN SR Take 150 mg by mouth 2 (two) times daily.   finasteride 5  MG tablet Commonly known as: PROSCAR Take 5 mg by mouth at bedtime.   gabapentin 300 MG capsule Commonly known as: NEURONTIN 600 mg. Take 2 tablets three daily AND 300 MG AT BEDTIME   hydrochlorothiazide 25 MG tablet Commonly known as: HYDRODIURIL Take 25 mg by mouth daily.   ketoconazole 2 % shampoo Commonly known as: NIZORAL Apply topically daily.   multivitamins ther. w/minerals Tabs tablet Take 1 tablet by mouth daily.   nortriptyline 10 MG capsule Commonly known as: PAMELOR 2 CAPSULES EVERY NIGHT AT BEDTIME   saccharomyces boulardii 250 MG capsule Commonly known as: FLORASTOR Take 1 capsule (250 mg total) by mouth 2 (two) times daily.   Thera-Gesic Plus Crea Apply 1 application topically at bedtime. Apply to lower back   traMADol 50 MG tablet Commonly known as: ULTRAM Take 100 mg by mouth 3 (three) times daily as needed.   zolpidem 10 MG tablet Commonly known as: AMBIEN Take 1 tablet (10 mg total) by mouth at bedtime. For sleep       Allergies:  Allergies  Allergen Reactions  . Codeine Sulfate Itching and Nausea Only    Past Medical History, Surgical history, Social history, and Family History were  reviewed and updated.  Review of Systems: Review of Systems  Constitutional: Negative.   HENT: Negative.   Eyes: Negative.   Respiratory: Negative.   Cardiovascular: Negative.   Gastrointestinal: Negative.   Genitourinary: Negative.   Musculoskeletal: Negative.   Skin: Negative.   Neurological: Negative.   Endo/Heme/Allergies: Negative.   Psychiatric/Behavioral: Negative.      Physical Exam:  weight is 270 lb (122.5 kg). His oral temperature is 97.8 F (36.6 C). His blood pressure is 127/76 and his pulse is 86. His respiration is 20 and oxygen saturation is 99%.   Wt Readings from Last 3 Encounters:  01/04/19 270 lb (122.5 kg)  09/06/18 274 lb (124.3 kg)  07/27/18 274 lb (124.3 kg)    Physical Exam Vitals signs reviewed.  HENT:     Head:  Normocephalic and atraumatic.  Eyes:     Pupils: Pupils are equal, round, and reactive to light.  Neck:     Musculoskeletal: Normal range of motion.  Cardiovascular:     Rate and Rhythm: Normal rate and regular rhythm.     Heart sounds: Normal heart sounds.  Pulmonary:     Effort: Pulmonary effort is normal.     Breath sounds: Normal breath sounds.  Abdominal:     General: Bowel sounds are normal.     Palpations: Abdomen is soft.  Musculoskeletal: Normal range of motion.        General: No tenderness or deformity.  Lymphadenopathy:     Cervical: No cervical adenopathy.  Skin:    General: Skin is warm and dry.     Findings: No erythema or rash.  Neurological:     Mental Status: He is alert and oriented to person, place, and time.  Psychiatric:        Behavior: Behavior normal.        Thought Content: Thought content normal.        Judgment: Judgment normal.      Lab Results  Component Value Date   WBC 10.2 01/04/2019   HGB 15.3 01/04/2019   HCT 46.8 01/04/2019   MCV 91.4 01/04/2019   PLT 210 01/04/2019   Lab Results  Component Value Date   IRON 80 11/29/2017   IRONPCTSAT 22.9 11/29/2017   Lab Results  Component Value Date   RBC 5.12 01/04/2019   No results found for: KPAFRELGTCHN, LAMBDASER, [facility] Lab Results  Component Value Date   IGGSERUM 549 (L) 04/07/2017   IGMSERUM 144 04/07/2017   Lab Results  Component Value Date   ALBUMINELP 3.7 (L) 04/07/2017   A1GS 0.4 (H) 04/07/2017   A2GS 0.8 04/07/2017   BETS 0.5 04/07/2017   BETA2SER 0.3 04/07/2017   GAMS 0.6 (L) 04/07/2017   SPEI  04/07/2017     Comment:     . One or more serum protein fractions are outside the normal ranges.  No abnormal protein bands are apparent. .      Chemistry      Component Value Date/Time   NA 141 01/04/2019 1416   NA 140 11/07/2014   NA 136 12/29/2013 1356   K 3.2 (L) 01/04/2019 1416   K 3.4 12/29/2013 1356   CL 98 01/04/2019 1416   CL 97 (L) 12/29/2013 1356    CO2 34 (H) 01/04/2019 1416   CO2 33 12/29/2013 1356   BUN 23 01/04/2019 1416   BUN 18 11/07/2014   BUN 15 12/29/2013 1356   CREATININE 1.41 (H) 01/04/2019 1416   CREATININE 1.2 12/29/2013 1356  GLU 84 11/07/2014      Component Value Date/Time   CALCIUM 8.8 (L) 01/04/2019 1416   CALCIUM 8.9 12/29/2013 1356   ALKPHOS 82 01/04/2019 1416   ALKPHOS 59 12/29/2013 1356   AST 16 01/04/2019 1416   ALT 15 01/04/2019 1416   ALT 27 12/29/2013 1356   BILITOT 0.6 01/04/2019 1416      Impression and Plan: Mr. Danielski is a very pleasant 73 yo caucasian gentleman with recurrent Pulmonary embolism/thromboembolic disease.  He is on lifelong Eliquis.  I do not think we have to do any scans on him.  I do not think we need any Dopplers right now.  He has a chronic nonocclusive thrombus in his left leg.  We will go ahead and plan to see him back in 6 months.  I think this would be reasonable at this point. We can certainly see him sooner if need be.   If he ever does have surgery, I told him to make sure that the surgeon calls [name] so we can help coordinate his Eliquis dosing.  Volanda Napoleon, MD 7/22/20203:49 PM

## 2019-01-10 ENCOUNTER — Other Ambulatory Visit (INDEPENDENT_AMBULATORY_CARE_PROVIDER_SITE_OTHER): Payer: Self-pay

## 2019-01-10 DIAGNOSIS — I340 Nonrheumatic mitral (valve) insufficiency: Secondary | ICD-10-CM

## 2019-01-11 ENCOUNTER — Other Ambulatory Visit (INDEPENDENT_AMBULATORY_CARE_PROVIDER_SITE_OTHER): Payer: Self-pay

## 2019-01-11 DIAGNOSIS — E782 Mixed hyperlipidemia: Secondary | ICD-10-CM

## 2019-01-11 DIAGNOSIS — I4892 Unspecified atrial flutter: Secondary | ICD-10-CM

## 2019-01-13 ENCOUNTER — Other Ambulatory Visit (INDEPENDENT_AMBULATORY_CARE_PROVIDER_SITE_OTHER): Payer: Self-pay | Admitting: Family Medicine

## 2019-01-13 NOTE — Telephone Encounter (Signed)
Last scheduled appointment with you was 11/21/2018.  Currently scheduled future appointment is 03/23/2019.    Patient has been seen within the last year: Yes.    Confirmed preferred pharmacy for this refill encounter is   [facility] [place]    [place] [address]    Phone: [phone] Fax: [phone]    Not a 24 hour pharmacy; exact hours not known.      Kathryne Gin, RN  01/13/2019, 26:94

## 2019-01-24 ENCOUNTER — Telehealth (INDEPENDENT_AMBULATORY_CARE_PROVIDER_SITE_OTHER): Payer: Self-pay | Admitting: Family Medicine

## 2019-01-24 NOTE — Telephone Encounter (Signed)
Received voicemail from patient requesting call back.    Placed a call back to patient- no answer, message left.

## 2019-01-26 ENCOUNTER — Telehealth: Payer: Self-pay

## 2019-01-26 NOTE — Telephone Encounter (Signed)
Patient called to schedule appointment for knee appointment. Scheduled.

## 2019-01-31 ENCOUNTER — Encounter (INDEPENDENT_AMBULATORY_CARE_PROVIDER_SITE_OTHER): Payer: Self-pay | Admitting: Cardiovascular Disease

## 2019-02-01 ENCOUNTER — Ambulatory Visit (INDEPENDENT_AMBULATORY_CARE_PROVIDER_SITE_OTHER): Payer: Self-pay | Admitting: Student in an Organized Health Care Education/Training Program

## 2019-02-03 ENCOUNTER — Telehealth (INDEPENDENT_AMBULATORY_CARE_PROVIDER_SITE_OTHER): Payer: Self-pay | Admitting: Cardiovascular Disease

## 2019-02-03 MED ORDER — FLECAINIDE 100 MG TABLET
100.00 mg | ORAL_TABLET | Freq: Two times a day (BID) | ORAL | 2 refills | Status: DC
Start: 2019-02-03 — End: 2019-02-07

## 2019-02-03 NOTE — Telephone Encounter (Signed)
[facility] called pt needs refill on flecainide spoke with dr Ginger Organ ok to refill

## 2019-02-07 ENCOUNTER — Ambulatory Visit (INDEPENDENT_AMBULATORY_CARE_PROVIDER_SITE_OTHER): Payer: No Typology Code available for payment source | Admitting: Family

## 2019-02-07 ENCOUNTER — Other Ambulatory Visit: Payer: Self-pay

## 2019-02-07 ENCOUNTER — Encounter (INDEPENDENT_AMBULATORY_CARE_PROVIDER_SITE_OTHER): Payer: Self-pay | Admitting: Family

## 2019-02-07 VITALS — BP 126/78 | HR 91 | Temp 96.8°F | Ht 70.0 in | Wt 252.0 lb

## 2019-02-07 DIAGNOSIS — I10 Essential (primary) hypertension: Secondary | ICD-10-CM

## 2019-02-07 DIAGNOSIS — Z9889 Other specified postprocedural states: Secondary | ICD-10-CM

## 2019-02-07 DIAGNOSIS — I4460 Unspecified fascicular block: Secondary | ICD-10-CM

## 2019-02-07 DIAGNOSIS — E782 Mixed hyperlipidemia: Secondary | ICD-10-CM

## 2019-02-07 DIAGNOSIS — I4892 Unspecified atrial flutter: Secondary | ICD-10-CM

## 2019-02-07 NOTE — Progress Notes (Signed)
[facility]  [facility]  [address]  [place] [address]  [phone]    Thomas [name] is a 73 y.o. male seen in the office on 02/07/2019    Chief Complaint   Patient presents with   . Follow Up 6 Months     HTN, LBBB, Atrial flutter          Patient Active Problem List   Diagnosis   . Atrial flutter (CMS HCC)   . Body mass index (BMI) of 33.0 to 33.9 in adult   . Chest tightness   . Essential hypertension   . Hyperlipidemia   . Left bundle branch hemiblock   . Paroxysmal atrial flutter (CMS HCC)   . History of cardioversion   . History of cardiac cath-Overall normal coronary anatomy 12/26/2018       History of Present Illness: This is 73 year old male patient with a past medical history of paroxysmal atrial flutter/fib, hypertension, hyperlipidemia, gout, and chronic shortness of breath-patient is to see pulmonology in this regard.  He was seen one time by Dr. Kristen Cardinal on 10/20/2017 secondary to atrial flutter.  Holter monitor, echocardiogram, and stress test were completed in the past.  Patient was seen at our office to establish care.  He was complaining of chest pain that radiated into his left arm and palpitations.  Nuclear stress test and Holter monitor was ordered.  Patient underwent nuclear stress test which returned abnormal.  Holter monitor was never completed.  Cardiac catheterization was completed 12/26/2018 which revealed overall normal coronary anatomy with an estimated ejection fraction of 55%.  Dr. Francie Massing recommended TEE/DC cardioversion as he was in atrial fibrillation/atrial flutter.  TEE was completed revealing an EF of 55-60%.  No evidence of thrombus was identified.  The patient was successfully cardioverted to normal sinus rhythm and initiated on flecainide.    He presents the office today for follow-up.  Unfortunately, he is back in atrial fibrillation.  His rate is controlled.  He does experience some discomfort in his  chest occasionally as well as palpitations, but states it typically occurs when he is stressed out.  They are not very bothersome to him.  His vital signs are stable today.  He denies shortness of breath, dizziness, syncope or near-syncope.      Current Outpatient Medications   Medication Sig   . acetaminophen (TYLENOL) 500 mg Oral Tablet Take 500 mg by mouth Every 4 hours as needed for Pain (pt takes tylenol pm)   . apixaban (ELIQUIS) 5 mg Oral Tablet Take 5 mg by mouth   . dilTIAZem (CARDIZEM CD) 120 mg Oral Capsule, Sust. Release 24 hr TAKE ONE CAPSULE BY MOUTH DAILY   . furosemide (LASIX) 40 mg Oral Tablet Take 1 Tab (40 mg total) by mouth Once a day   . lisinopriL (PRINIVIL) 40 mg Oral Tablet Take 1 Tab (40 mg total) by mouth Once a day   . melatonin 5 mg Oral Tablet Take 5 mg by mouth Every night   . nitroGLYCERIN (NITROSTAT) 0.4 mg Sublingual Tablet, Sublingual 1 Tab (0.4 mg total) by Sublingual route Every 5 minutes as needed for Chest pain   . potassium chloride (KLOR-CON) 10 mEq Oral Tablet Sustained Release Take 1 Tab (10 mEq total) by mouth Twice daily   . simvastatin (ZOCOR) 20 mg Oral Tablet Take 1 Tab (20 mg total) by mouth Every evening   . umeclidinium-vilanteroL (ANORO ELLIPTA) 62.5-25 mcg/actuation Inhalation Disk with Device  Take by inhalation   . VITAMIN E ORAL Take 800 mg by mouth       No Known Allergies  Family Medical History:     Problem Relation (Age of Onset)    Cancer Mother    Hypertension (High Blood Pressure) Mother, Father            Social History     Socioeconomic History   . Marital status: Widowed     Spouse name: Not on file   . Number of children: Not on file   . Years of education: Not on file   . Highest education level: Not on file   Tobacco Use   . Smoking status: Former Smoker     Years: 60.00     Types: Cigarettes, Cigars   . Smokeless tobacco: Current User     Types: Chew, Snuff   Substance and Sexual Activity   . Alcohol use: Yes     Frequency: Never   . Drug use: Never    . Sexual activity: Not Currently         Physical Exam:  BP 126/78   Pulse 91   Temp 36 C (96.8 F)   Ht 1.778 m (5\' 10" )   Wt 114 kg (252 lb)   SpO2 96%   BMI 36.16 kg/m       Body mass index is 36.16 kg/m.  Wt Readings from Last 5 Encounters:   02/07/19 114 kg (252 lb)   11/25/18 69.9 kg (154 lb)   11/21/18 114 kg (251 lb 8 oz)   07/25/18 112 kg (248 lb)   07/11/18 113 kg (249 lb 2 oz)     The patient is in no distress. Skin is warm and dry. There is no neck vein distention. No carotid bruits heard. The lungs are clear bilaterally. The heart is irregularly irregular without any significant murmur, gallop,rub,or click.  Bowel sounds are positive.  The abdomen is soft and nontender. The aorta is not palpable. Extremities show no significant edema.  Distal pulses are intact.    PERTINENT DIAGNOSTICS:  EKG today reveals atrial fibrillation at 80 beats per minute.  Lipid profile from 03-2017 revealed total cholesterol 198, triglycerides 82, HDL 58, LDL 82.     IMPRESSION:  1. Paroxysmal Atrial fibrillation/atrial flutter    2. Essential hypertension    3. Mixed hyperlipidemia    4. Left bundle branch hemiblock    5. History of cardioversion    6. History of cardiac cath-Overall normal coronary anatomy 12/26/2018          PLAN:  Patient presents for follow-up after undergoing successful TEE/cardioversion on 01/10/2019.  He was initiated on flecainide.  Unfortunately, he is back into atrial fibrillation.  For the most part, he is asymptomatic in this regard.  I have ordered to discontinue his flecainide as it is no longer indicated.  I did discuss treatment options going forward to include re-attempting TEE/cardioversion in the future, referral to electrophysiology, or continuing with rate control and anticoagulation.  At this time, patient wishes to continue with rate control and anticoagulation.  He will phone our office if he decides otherwise.  His rate is well controlled today.  He is properly  anticoagulated on Eliquis without signs or symptoms of bleeding.  No further changes have been made to his current medication regimen.  He will return to our office in 6 months or sooner if needed.  He will phone our office with any questions or  concerns in the meantime.  Dr. Ginger Organ was readily available for discussion regarding this patient's care.  Orders Placed This Encounter   . ECG (IN CLINIC)     Lucrezia Starch, CRNP

## 2019-02-09 ENCOUNTER — Encounter (INDEPENDENT_AMBULATORY_CARE_PROVIDER_SITE_OTHER): Payer: Self-pay | Admitting: Family Medicine

## 2019-02-22 DIAGNOSIS — R69 Illness, unspecified: Secondary | ICD-10-CM | POA: Diagnosis not present

## 2019-02-27 ENCOUNTER — Encounter (INDEPENDENT_AMBULATORY_CARE_PROVIDER_SITE_OTHER): Payer: Self-pay | Admitting: Family

## 2019-02-27 ENCOUNTER — Other Ambulatory Visit (INDEPENDENT_AMBULATORY_CARE_PROVIDER_SITE_OTHER): Payer: Self-pay | Admitting: Family Medicine

## 2019-02-27 NOTE — Telephone Encounter (Signed)
Last scheduled appointment with you was 11/21/2018.  Currently scheduled future appointment is 03/23/2019.    Patient has been seen within the last year: Yes.    Confirmed preferred pharmacy for this refill encounter is   [facility] [place], [place]    [place] [address]    Phone: [phone] Fax: [phone]    Not a 24 hour pharmacy; exact hours not known.      Christianne Dolin, RN  02/27/2019, 15:47

## 2019-02-28 NOTE — Progress Notes (Signed)
[name] [place], [place] [address] Phone: [phone] Subjective:   [name], am serving as a scribe for Dr. Hulan Saas.   CC: Left knee pain  [id number]   09/06/2018 Repeat injection given today.  Tolerated the procedure well.  Failed viscosupplementation, discussed PRP patient wants to hold on that at the moment.  Discussed icing regimen and home exercise.  Discussed which activities to do which wants to avoid.  Follow-up with me again 4 to 8 weeks  Update 03/01/2019 Tanner [name] is a 73 y.o. male coming in with complaint of left knee pain. States that pain has been slowly increasing for 6 weeks. Is thinking about getting a new knee. Is [name] longer caring for his wife who passed in May.  Not as active as usual.  Still having instability.  Considering the possibility of a replacement now.     Past Medical History:  Diagnosis Date  . Acute kidney failure ([place])   . Arthritis   . Back pain   . BPH (benign prostatic hypertrophy)   . Clostridium difficile infection   . Clotting disorder ([place])   . Depression   . Diabetes ([place]) 12/11/2016  . DVT (deep venous thrombosis) ([place])   . DVT of deep femoral vein, right ([place]) 06/29/2018  . Hypertension   . Low back pain potentially associated with spinal stenosis   . Neuromuscular disorder ([place])   . Neuropathy of lower extremity    bilateral  . OSA (obstructive sleep apnea)   . PE (pulmonary embolism)   . Ventral hernia    Past Surgical History:  Procedure Laterality Date  . EYE SURGERY    . HERNIA REPAIR     Social History   Socioeconomic History  . Marital status: Married    Spouse name: Not on file  . Number of children: 2  . Years of education: Not on file  . Highest education level: Not on file  Occupational History  . Occupation: Retired  Scientific laboratory technician  . Financial resource strain: Not on file  . Food insecurity    Worry: Not on file    Inability: Not on file   . Transportation needs    Medical: Not on file    Non-medical: Not on file  Tobacco Use  . Smoking status: Former Smoker    Packs/day: 2.00    Years: 33.00    Pack years: 66.00    Types: Cigarettes    Start date: 08/30/1968    Quit date: 07/02/2001    Years since quitting: 17.6  . Smokeless tobacco: Never Used  . Tobacco comment: quit 12 years ago  Substance and Sexual Activity  . Alcohol use: Yes    Comment: occasional  . Drug use: [name]  . Sexual activity: Not Currently  Lifestyle  . Physical activity    Days per week: Not on file    Minutes per session: Not on file  . Stress: Not on file  Relationships  . Social Herbalist on phone: Not on file    Gets together: Not on file    Attends religious service: Not on file    Active member of club or organization: Not on file    Attends meetings of clubs or organizations: Not on file    Relationship status: Not on file  Other Topics Concern  . 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    Allergies  Allergen Reactions  . Codeine Sulfate Itching and Nausea Only   Family History  Problem Relation Age of Onset  . Diabetes Mother   . Pneumonia Mother   . Alzheimer's disease Mother   . Other Father 49       Drowned on boating accident  . Colon cancer Neg Hx   . Colon polyps Neg Hx      Current Outpatient Medications (Cardiovascular):  .  atenolol (TENORMIN) 25 MG tablet, Take 12.5 mg by mouth daily. .  hydrochlorothiazide (HYDRODIURIL) 25 MG tablet, Take 25 mg by mouth daily.   Current Outpatient Medications (Analgesics):  .  acetaminophen (TYLENOL) 500 MG tablet, Take 1,000 mg by mouth as needed. Marland Kitchen  allopurinol (ZYLOPRIM) 100 MG tablet, Take 2 tablets (200 mg total) by mouth daily. .  traMADol (ULTRAM) 50 MG tablet, Take 100 mg by mouth 3 (three) times  daily as needed.   Current Outpatient Medications (Hematological):  .  apixaban (ELIQUIS) 5 MG TABS tablet, Take 1 tablet (5 mg total) by mouth 2 (two) times daily.  Current Outpatient Medications (Other):  Marland Kitchen  buPROPion (WELLBUTRIN SR) 150 MG 12 hr tablet, Take 150 mg by mouth 2 (two) times daily. .  finasteride (PROSCAR) 5 MG tablet, Take 5 mg by mouth at bedtime. .  gabapentin (NEURONTIN) 300 MG capsule, 600 mg. Take 2 tablets three daily AND 300 MG AT BEDTIME .  ketoconazole (NIZORAL) 2 % shampoo, Apply topically daily. .  Menthol-Methyl Salicylate (THERA-GESIC PLUS) CREA, Apply 1 application topically at bedtime. Apply to lower back .  Multiple Vitamins-Minerals (MULTIVITAMINS THER. W/MINERALS) TABS, Take 1 tablet by mouth daily. .  nortriptyline (PAMELOR) 10 MG capsule, 2 CAPSULES EVERY NIGHT AT BEDTIME .  saccharomyces boulardii (FLORASTOR) 250 MG capsule, Take 1 capsule (250 mg total) by mouth 2 (two) times daily. Marland Kitchen  zolpidem (AMBIEN) 10 MG tablet, Take 1 tablet (10 mg total) by mouth at bedtime. For sleep    Past medical history, social, surgical and family history all reviewed in electronic medical record.  [name] pertanent information unless stated regarding to the chief complaint.   Review of Systems:  [name] headache, visual changes, nausea, vomiting, diarrhea, constipation, dizziness, abdominal pain, skin rash, fevers, chills, night sweats, weight loss, swollen lymph nodes,chest pain, shortness of breath, mood changes.  Muscle aches, joint swelling  Objective  Blood pressure 122/74, pulse 76, height 5\' 7"  (1.702 m), weight 281 lb (127.5 kg), SpO2 96 %.    General: [name] apparent distress alert and oriented x3 mood and affect normal, dressed appropriately.  Asymmetry of the face HEENT: Pupils equal, extraocular movements intact  Respiratory: Patient's speak in full sentences and does not appear short of breath  Cardiovascular: 1+ lower extremity edema, bilateral non tender, [name] erythema   Skin: Warm dry intact with [name] signs of infection or rash on extremities or on axial skeleton.  Abdomen: Soft nontender  Neuro: Cranial nerves II through XII are intact, neurovascularly intact in all extremities with 2+ DTRs and 2+ pulses.  Lymph: [name] lymphadenopathy of posterior or anterior cervical chain or axillae bilaterally.  Gait antalgic gait MSK:  tender with limited range of motion and good stability and symmetric strength and tone of shoulders, elbows, wrist, and ankles bilaterally.  Knee: Left valgus deformity noted.  Abnormal  thigh to calf ratio.  Tender to palpation over medial and PF joint line.  ROM decreased lacking the last 10 degrees of extension of the last 20 degrees of flexion instability with valgus force.  painful patellar compression. Patellar glide with moderate crepitus. Patellar and quadriceps tendons unremarkable. Hamstring and quadriceps strength is normal. Contralateral knee mild arthritic changes 1  After informed written and verbal consent, patient was seated on exam table. Left knee was prepped with alcohol swab and utilizing anterolateral approach, patient's left knee space was injected with 4:1  marcaine 0.5%: Kenalog 40mg /dL. Patient tolerated the procedure well without immediate complications.      Impression and Recommendations:     This case required medical decision making of moderate complexity. The above documentation has been reviewed and is accurate and complete Lyndal Pulley, DO       Note: This dictation was prepared with Dragon dictation along with smaller phrase technology. Any transcriptional errors that result from this process are unintentional.

## 2019-03-01 ENCOUNTER — Encounter: Payer: Self-pay | Admitting: Family Medicine

## 2019-03-01 ENCOUNTER — Ambulatory Visit (INDEPENDENT_AMBULATORY_CARE_PROVIDER_SITE_OTHER): Payer: Medicare HMO | Admitting: Family Medicine

## 2019-03-01 DIAGNOSIS — M1712 Unilateral primary osteoarthritis, left knee: Secondary | ICD-10-CM | POA: Diagnosis not present

## 2019-03-01 NOTE — Assessment & Plan Note (Signed)
Patient has had this for quite some time.  Patient was holding off in any type of surgical intervention secondary to patient's wife being sick.  Patient is considering the possibility of surgery at this time and will be referred.  We discussed otherwise viscosupplementation or PRP.  Patient will consider these things.  Follow-up again in 4 to 6 weeks.

## 2019-03-01 NOTE — Patient Instructions (Signed)
Dr. Latanya Maudlin or Dr. Judd Lien [facility] See me in 6-8 weeks

## 2019-03-17 ENCOUNTER — Ambulatory Visit (INDEPENDENT_AMBULATORY_CARE_PROVIDER_SITE_OTHER): Payer: No Typology Code available for payment source | Admitting: Nurse Practitioner

## 2019-03-17 ENCOUNTER — Other Ambulatory Visit: Payer: Self-pay

## 2019-03-17 ENCOUNTER — Telehealth (INDEPENDENT_AMBULATORY_CARE_PROVIDER_SITE_OTHER): Payer: Self-pay | Admitting: Nurse Practitioner

## 2019-03-17 ENCOUNTER — Encounter (INDEPENDENT_AMBULATORY_CARE_PROVIDER_SITE_OTHER): Payer: Self-pay | Admitting: Nurse Practitioner

## 2019-03-17 VITALS — BP 160/94 | HR 91 | Temp 97.0°F | Ht 70.0 in | Wt 256.1 lb

## 2019-03-17 DIAGNOSIS — Z136 Encounter for screening for cardiovascular disorders: Secondary | ICD-10-CM

## 2019-03-17 DIAGNOSIS — Z0000 Encounter for general adult medical examination without abnormal findings: Secondary | ICD-10-CM

## 2019-03-17 NOTE — Telephone Encounter (Signed)
Patient will call and schedule his abd duplex screening for AAA at [facility] script and number for scheduling to patient

## 2019-03-17 NOTE — Nursing Note (Signed)
03/17/19 1000   Medicare Wellness Assessment   Medicare initial or wellness physical in the last year? No   Advance Directives   Does patient have a living will or MPOA no   Has patient provided [employer] with a copy? no   Advance directive information given to the patient today? YES   Activities of Daily Living   Do you need help with dressing, bathing, or walking? No   Do you need help with shopping, housekeeping, medications, or finances? No   Do you have rugs in hallways, broken steps, or poor lighting? No   Do you have grab bars in your bathroom, non-slip strips in your tub, and hand rails on your stairs? Yes   Cognitive Function Screen   What is you age? 1   What is the time to the nearest hour? 1   What is the year? 1   What is the name of this clinic? 1   Can the patient recognize two persons (the doctor, the nurse, home help, etc.)? 1   What is the date of your birth? (day and month sufficient)  1   In what year did World War II end? 1   Who is the current president of the [place]? 1   Count from 20 down to 1? 1   What address did I give you earlier? 1   Total Score 10   Depression Screen   Little interest or pleasure in doing things. 0   Feeling down, depressed, or hopeless 0   PHQ 2 Total 0   Hearing Screen   Have you noticed any hearing difficulties? Yes   After whispering 9-1-6 how many numbers did the patient repeat correctly? 3   Fall Risk Assessment   Do you feel unsteady when standing or walking? No   Do you worry about falling? No   Have you fallen in the past year? No   Vision Screen   Right Eye = 20 20   Left Eye = 20 20

## 2019-03-17 NOTE — Progress Notes (Signed)
[facility]  [place]  [place] [address]    Medicare Annual Wellness Visit    Name: Thomas [name] MRN:  [id number]   Date: 03/17/2019 Age: 73 y.o.       SUBJECTIVE:   Thomas [name] is a 73 y.o. male for presenting for Medicare Wellness exam.   I have reviewed and reconciled the medication list with the patient today.    Comprehensive Health Assessment:  Paper document [place] reviewed, signed and scanned into medical record    I have reviewed and updated as appropriate the past medical, family and social history. 03/17/2019 as summarized below:  Past Medical History:   Diagnosis Date   . Arthritis    . Dysuria    . Enteritis due to Rotavirus    . Gout    . Headache    . Hemorrhoid    . Hypercholesterolemia    . Hypertension      Past Surgical History:   Procedure Laterality Date   . Hx hemorrhoidectomy       Current Outpatient Medications   Medication Sig   . acetaminophen (TYLENOL) 500 mg Oral Tablet Take 500 mg by mouth Every 4 hours as needed for Pain (pt takes tylenol pm)   . apixaban (ELIQUIS) 5 mg Oral Tablet Take 5 mg by mouth   . dilTIAZem (CARDIZEM CD) 120 mg Oral Capsule, Sust. Release 24 hr TAKE ONE CAPSULE BY MOUTH DAILY   . furosemide (LASIX) 40 mg Oral Tablet Take 1 Tab (40 mg total) by mouth Once a day   . lisinopriL (PRINIVIL) 40 mg Oral Tablet Take 1 Tab (40 mg total) by mouth Once a day   . melatonin 5 mg Oral Tablet Take 5 mg by mouth Every night   . nitroGLYCERIN (NITROSTAT) 0.4 mg Sublingual Tablet, Sublingual 1 Tab (0.4 mg total) by Sublingual route Every 5 minutes as needed for Chest pain   . potassium chloride (KLOR-CON) 10 mEq Oral Tablet Sustained Release Take 1 Tab (10 mEq total) by mouth Twice daily   . simvastatin (ZOCOR) 20 mg Oral Tablet Take 1 Tab (20 mg total) by mouth Every evening   . STIOLTO RESPIMAT 2.5-2.5 mcg/actuation Inhalation Mist INHALE TWO PUFFS BY MOUTH DAILY   . umeclidinium-vilanteroL (ANORO ELLIPTA)  62.5-25 mcg/actuation Inhalation Disk with Device Take by inhalation   . VITAMIN E ORAL Take 800 mg by mouth     Family Medical History:     Problem Relation (Age of Onset)    Cancer Mother    Hypertension (High Blood Pressure) Mother, Father            Social History     Socioeconomic History   . Marital status: Widowed     Spouse name: Not on file   . Number of children: Not on file   . Years of education: Not on file   . Highest education level: Not on file   Tobacco Use   . Smoking status: Former Smoker     Years: 60.00     Types: Cigarettes, Cigars   . Smokeless tobacco: Current User     Types: Chew, Snuff   Substance and Sexual Activity   . Alcohol use: Yes     Frequency: Never   . Drug use: Never   . Sexual activity: Not Currently     Review of Systems: A comprehensive review of systems was negative.     List of Current Health  Care Providers   Care Team     PCP     Name Type Specialty Phone Number    Rhoderick Moody, DO Physician [facility] [phone]          Care Team     No care team found                  Health Maintenance   Topic Date Due   . Hepatitis C screening  10/28/1989   . Shingles Vaccine (1 of 2) 10/29/1994   . Colonoscopy  10/29/1994   . AAA Screening  10/28/2009   . Annual Wellness Visit  01/12/2018   . Depression Screening  11/21/2019   . Adult Tdap-Td (1 - Tdap) 05/25/2026   . Pneumococcal 65+ Years Low Risk  Completed   . Influenza Vaccine  Completed     Medicare Wellness Assessment   Medicare initial or wellness physical in the last year?: No  Advance Directives (optional)   Does patient have a living will or MPOA: no   Has patient provided [employer] with a copy?: no   Advance directive information given to the patient today?: YES      Activities of Daily Living   Do you need help with dressing, bathing, or walking?: No   Do you need help with shopping, housekeeping, medications, or finances?: No   Do you have rugs in hallways, broken steps, or poor lighting?: No   Do you have  grab bars in your bathroom, non-slip strips in your tub, and hand rails on your stairs?: Yes   Urinary Incontinence Screen (Women >=65 only)       Cognitive Function Screen (1=Yes, 0=No)   What is you age?: Correct   What is the time to the nearest hour?: Correct   What is the year?: Correct   What is the name of this clinic?: Correct   Can the patient recognize two persons (the doctor, the nurse, home help, etc.)?: Correct   What is the date of your birth? (day and month sufficient) : Correct   In what year did World War II end?: Correct   Who is the current president of the [place]?: Correct   Count from 20 down to 1?: Correct   What address did I give you earlier?: Correct   Total Score: 10       Hearing Screen   Have you noticed any hearing difficulties?: Yes  After whispering 9-1-6 how many numbers did the patient repeat correctly?: 3   Fall Risk Screen   Do you feel unsteady when standing or walking?: No  Do you worry about falling?: No  Have you fallen in the past year?: No   Vision Screen   Right Eye = 20: 20   Left Eye = 20: 20   Depression Screen   Little interest or pleasure in doing things.: Not at all  Feeling down, depressed, or hopeless: Not at all  PHQ 2 Total: 0        OBJECTIVE:   BP (!) 160/94   Pulse 91   Temp 36.1 C (97 F)   Ht 1.778 m (5\' 10" )   Wt 116 kg (256 lb 2 oz)   SpO2 95%   BMI 36.75 kg/m     BP recheck:  120/88    Other appropriate exam:  HEENT:   Head Normocephalic. No masses, lesions, tenderness or abnormalities  Eyes: PERRLA, EOMI, conjunctiva wnl.  Bilateral fundoscopic exams wnl.  Ears: Bilateral TMs intact and clear.  Nose: Bilateral nares patent  Throat: Pharynx clear. No lymphadenopathyor thyromegaly.  RESPIRATORY: CTA, no resp distress, equal BS  HEART: Regular rate and rhythm, no murmur, no edema, pedal pulses 2+  GI:  BS present in 4 quadrants.  No tenderness, masses, organomegaly.  Abdomen soft.  EXTREMITIES: no edema, erythema or tenderness  NEURO: AAOx3,  CN's intact, DTR's 2/4 throughout, Romberg negative    Health Maintenance Due   Topic Date Due   . Hepatitis C screening  10/28/1989   . Shingles Vaccine (1 of 2) 10/29/1994   . Colonoscopy  10/29/1994   . AAA Screening  10/28/2009   . Annual Wellness Visit  01/12/2018      ASSESSMENT & PLAN:      Identified Risk Factors/ Recommended Actions     He is up-to-date with hepatitis C antibody screening from July 24, 2015 and January 18, 2016. (Neg).   he has not had a colonoscopy but did have negative Cologuard testing May 27, 2017.   advised Shingrix vaccine.  Pt refused.   advised AAA screening.  Ordered.        Return for regular follow-up of chronic medical problems as planned.         The patient has been educated about risk factors and recommended preventive care. Written Prevention Plan completed/ updated and given to patient (see After Visit Summary).    Genelle Gatherherie Dolan, CRNP  [facility]  [facility]  [address]  [place] [id number]  [phone]

## 2019-03-17 NOTE — Patient Instructions (Signed)
Medicare Preventive Services  Medicare coverage information Recommendation for YOU   Heart Disease and Diabetes   Lipid profile every 5 years or more often if at risk for cardiovascular disease  Last Lipid Panel  (Last result in the past 2 years)      Cholesterol   HDL   LDL   Direct LDL   Triglycerides      11/16/18 151 52 87   58         Diabetes Screening with Blood Glucose test or Glucose Tolerance Test  yearly for those at risk for diabetes, up to two tests per year for those with prediabetes  Last Glucose: 101     Diabetes Self-Management Training initial training ten hours per year, and follow-up training two hours per subsequent year. Optional for those with diabetes    Medical Nutrition Therapy three hours of one-on-one counseling in first year, two hours in subsequent years. Optional for those with diabetes, kidney disease   Intensive Behavioral Therapy for Obesity  Face-to-face counseling, first month every week, month 2-6 every other week, month 7-12 every month if continued progress is documented Optional for those with Body Mass Index 30 or higher  Your Body mass index is 36.16 kg/m.   Tobacco Cessation (Quitting) Counseling   Two attempts per year, max 4 sessions per attempt, up to 8 sessions per year Optional for those who use tobacco    Cancer Screening   Colorectal screening   for anyone age 59 to 64 or any age if high risk:  . Screening Colonoscopy every 10 years, more frequent if high risk  OR  . Flexible  Sigmoidoscopy  every 5 years OR  . Fecal Occult Blood Testing yearly OR  . Cologuard Stool DNA test once every 3 years OR  . CT Colonography every 5 years  See Your Schedule below   Prostate Cancer Screening  Prostate Specific Antigen and Digital Rectal Exam NOT routinely recommended  Medicare will still cover annually after age 76 if recommended by your provider      Lung Cancer Screening  Annual low dose computed tomography (LDCT scan) is recommended for those age 80-77 who smoked 30  pack-years and are current smokers or quit smoking within past 15 years (one pack-year= smoking one PPD for one year), after counseling by your doctor or nurse clinician about the possible benefits or harms. See Your Schedule below   Vaccinations   Pneumococcal Vaccine recommended routinely age 59+ with two separate vaccines one year apart (Prevnar then Pneumovax).  Recommended before age 70 if medical conditions increase risk  Seasonal Influenza Vaccine once every flu season   Hepatitis B Vaccine 3 doses if risk (including anyone with diabetes or liver disease)  Shingles Vaccine Once or twice age 10 or older  Diphtheria Tetanus Pertussis Vaccine ONCE as adult and a booster every 10 years      Immunization History   Administered Date(s) Administered   . Diptheria & Tetanus Toxoid,Adsorbed, (Decavac/Tenivac) 63YRS & OLDER (Admin) 05/25/2016   . Influenza Vaccine High-Dose IM-QUAD 05/25/2016, 03/02/2019   . Influenza Vaccine IM-Quadrivalent (Admin) 03/26/2017, 05/07/2018   . Pneumovax 07/14/2013   . Prevnar 13 (Admin) 01/22/2016, 05/24/2018     Shingles vaccine and Diphtheria Tetanus Pertussis vaccines are available at pharmacies or local health department without a prescription.   Other Screening   Glaucoma Screening   yearly if in high risk group such as diabetes, family history, African American age 66+ or Hispanic American age  65+    Hepatitis C Screening recommended ONCE for those born between 1945-1965, or high risk for HCV infection  See Your Schedule below   HIV Testing recommended routinely at least ONCE, covered every year for age 69 to 33 regardless of risk, and every year for age over 57 who ask for the test or higher risk See Your Schedule below  Abdominal Aortic Aneurysm Screening Ultrasound Recommended ONCE for any male who smoked 100 cigarettes/lifetime OR with family history of aortic aneurysm     Your Personalized Schedule for Preventive Tests   Health Maintenance: Pending and Last Completed        Date Due Completion Date    Hepatitis C screening 10/28/1989 ---    Shingles Vaccine (1 of 2) 10/29/1994 ---    Colonoscopy 10/29/1994 ---    AAA Screening 10/28/2009 ---    Annual Wellness Visit 01/12/2018 ---    Depression Screening 11/21/2019 11/21/2018    Adult Tdap-Td (1 - Tdap) 05/25/2026 05/25/2016

## 2019-03-23 ENCOUNTER — Encounter (INDEPENDENT_AMBULATORY_CARE_PROVIDER_SITE_OTHER): Payer: Self-pay | Admitting: Family Medicine

## 2019-03-24 ENCOUNTER — Encounter (INDEPENDENT_AMBULATORY_CARE_PROVIDER_SITE_OTHER): Payer: Self-pay | Admitting: Family Medicine

## 2019-03-24 ENCOUNTER — Ambulatory Visit (INDEPENDENT_AMBULATORY_CARE_PROVIDER_SITE_OTHER): Payer: No Typology Code available for payment source | Admitting: Family Medicine

## 2019-03-24 ENCOUNTER — Other Ambulatory Visit: Payer: Self-pay

## 2019-03-24 VITALS — BP 120/80 | HR 72 | Temp 98.3°F | Ht 70.0 in | Wt 260.5 lb

## 2019-03-24 DIAGNOSIS — E785 Hyperlipidemia, unspecified: Secondary | ICD-10-CM

## 2019-03-24 DIAGNOSIS — Z9889 Other specified postprocedural states: Secondary | ICD-10-CM

## 2019-03-24 DIAGNOSIS — R39198 Other difficulties with micturition: Secondary | ICD-10-CM

## 2019-03-24 DIAGNOSIS — I10 Essential (primary) hypertension: Secondary | ICD-10-CM

## 2019-03-24 DIAGNOSIS — I4892 Unspecified atrial flutter: Secondary | ICD-10-CM

## 2019-03-24 DIAGNOSIS — R600 Localized edema: Secondary | ICD-10-CM

## 2019-03-24 MED ORDER — SIMVASTATIN 20 MG TABLET
20.00 mg | ORAL_TABLET | ORAL | Status: DC
Start: 2019-03-24 — End: 2019-10-20

## 2019-03-24 MED ORDER — LISINOPRIL 40 MG TABLET
40.0000 mg | ORAL_TABLET | Freq: Every day | ORAL | 1 refills | Status: DC
Start: 2019-03-24 — End: 2019-09-29

## 2019-03-24 MED ORDER — FUROSEMIDE 40 MG TABLET
40.0000 mg | ORAL_TABLET | Freq: Every day | ORAL | 1 refills | Status: DC
Start: 2019-03-24 — End: 2019-09-29

## 2019-03-24 MED ORDER — POTASSIUM CHLORIDE ER 10 MEQ TABLET,EXTENDED RELEASE
10.0000 meq | ORAL_TABLET | Freq: Every day | ORAL | 3 refills | Status: DC
Start: 2019-03-24 — End: 2019-12-15

## 2019-03-24 NOTE — Nursing Note (Signed)
Follow up chronic medical conditions.

## 2019-03-24 NOTE — Progress Notes (Signed)
Thomas [name] is a 73 y.o. male who presents today for follow-up of hypertension and hyperlipidemia and chronic medical problems.  The patient reports current adherence to recommended diet is good. The pt is compliant with medicine.  Regarding HTN: The following symptoms are also present: none, and he also denies the following symptoms: headache, blurred vision, chest discomfort, shortness of breath and palpitations.  Side effects of medication:none.    Regarding lipid treatment: He is taking Zocor and is having no side effects. Last labs were excellent from 11/16/2018 with a total cholesterol 151, triglycerides 58, HDL 52 and LDL 87  He does have a history of atrial flutter and has follow-up with cardiology.  He did see his cardiologist in [place] at the end of August.  He had had a cardioversion in July that was successful initially but then he did thank you go back into atrial fibrillation and at the cardiology appointment they stopped his flecainide because they felt that it was not working.  The patient admits that just recently went back on his flecainide because he noticed his blood pressure and heart rate going up.  He states that since he went back on the medication is numbers are better.  When he showed me a list of his readings the highest his heart rate went was 90. Marland Kitchen  He was not feeling any symptoms such as chest tightness or shortness of breath or palpitations.  No lightheadedness or dizziness.Marland Kitchen  He also takes diltiazem for his rate control but he did not feel that that was doing a good enough job controlling his heart rate and like I stated he should me a list of his readings which he does multiple times a day and they were never over 90. He does continue his anticoagulant and is not having any bleeding.    He does admit to take the Lasix and potassium on a daily basis.  His only other complaint is is difficulty with his urinary stream.  He feels that he is able to start the stream but then it  starts and stops very often while he is trying to urinate.  He did see urology in the past but he did not like the doctor that he went to because he states that he treated him very routinely and he would like to see someone else.    REVIEW OF SYSTEMS:  GENERAL: negative for fevers/chills, fatigue, significant weight change, sleep disturbance  HEENT:denies visual changes, sore throat or URI symptoms  RESPIRATORY:shortness of breath as abpve, no cough, or wheeze  CARDIAC:  Chest pains and shortness of breath as above  GI: no nausea/vomitting/diarrhea, no abdominal pain  MUSCULOSKELETAL: no myalgias or arthragias, no recent trauma, no edema   NEUROLOGIC: no headache, visual changes or mental status changes  GU: no urinary symptoms or CVA tenderness, difficulty keeping a good urinary stream as above in history of present illness      No Known Allergies  Current Outpatient Medications   Medication Sig   . acetaminophen (TYLENOL) 500 mg Oral Tablet Take 500 mg by mouth Every 4 hours as needed for Pain (pt takes tylenol pm)   . apixaban (ELIQUIS) 5 mg Oral Tablet Take 5 mg by mouth   . dilTIAZem (CARDIZEM CD) 120 mg Oral Capsule, Sust. Release 24 hr TAKE ONE CAPSULE BY MOUTH DAILY   . furosemide (LASIX) 40 mg Oral Tablet Take 1 Tab (40 mg total) by mouth Once a day   . lisinopriL (PRINIVIL) 40  mg Oral Tablet Take 1 Tab (40 mg total) by mouth Once a day   . melatonin 5 mg Oral Tablet Take 5 mg by mouth Every night   . nitroGLYCERIN (NITROSTAT) 0.4 mg Sublingual Tablet, Sublingual 1 Tab (0.4 mg total) by Sublingual route Every 5 minutes as needed for Chest pain   . potassium chloride (KLOR-CON) 10 mEq Oral Tablet Sustained Release Take 1 Tab (10 mEq total) by mouth Once a day   . simvastatin (ZOCOR) 20 mg Oral Tablet Take 1 Tab (20 mg total) by mouth Every other day   . STIOLTO RESPIMAT 2.5-2.5 mcg/actuation Inhalation Mist INHALE TWO PUFFS BY MOUTH DAILY   . VITAMIN E ORAL Take 800 mg by mouth     Past Medical History:      Diagnosis Date   . Arthritis    . Dysuria    . Enteritis due to Rotavirus    . Gout    . Headache    . Hemorrhoid    . Hypercholesterolemia    . Hypertension          Past Surgical History:   Procedure Laterality Date   . HX HEMORRHOIDECTOMY           Social History     Tobacco Use   . Smoking status: Former Smoker     Years: 60.00     Types: Cigarettes, Cigars   . Smokeless tobacco: Current User     Types: Chew, Snuff   Substance Use Topics   . Alcohol use: Yes     Frequency: Never        PHYSICAL EXAM:   The patient appears to be in no acute distress.  Vitals: BP 120/80   Pulse 72   Temp 36.8 C (98.3 F)   Ht 1.778 m (5\' 10" )   Wt 118 kg (260 lb 8 oz)   SpO2 96%   BMI 37.38 kg/m   Respiratory: clear to ascultation B/L, no respiratory distress, equal BS throughout, no bibasilar rales  Heart:  Irregularly irregular,, no murmur, no edema, normal peripheral pulses  Abdomen: soft, nontender, positive bowel sounds  Extremities: no edema, no calf pain or tenderness  Neuro: AAOx3, cranial nerves's intact, DTR's 2/4 throughout  Skin: warm and dry, no rashes or lesions    ASSESSMENT:   .    ICD-10-CM    1. Essential hypertension  I10 BASIC METABOLIC PANEL, FASTING     CBC/DIFF-his blood pressure is excellent today.  Continue his current regimen   2. Hyperlipidemia  E78.5 ALT (SGPT)-lipids are excellent.  Continue current dose of statin     AST (SGOT)     BASIC METABOLIC PANEL, FASTING     LIPID PANEL     CBC/DIFF   3. Pedal edema  R60.0 Well controlled with Lasix.   4. Paroxysmal atrial flutter (CMS HCC)  I48.92 I explained to the patient that he cannot start and stop his anti arrhythmic medication on his own and he needs to call and speak with his cardiologist.  He actually is in normal sinus rhythm today.  He is going to call Cardiology.  He did have a cardioversion done in July however did go back into an abnormal atrial rhythm when he was there for his follow-up in August.  He is anticoagulated his heart rate  is very well controlled.  I explained him that he is not need to check it so often during the day because he is going to see a  natural variation in his heart rate numbers.  and I did tell him that it was okay because his highest heart rate he even had was 90 and usually it was in the 70s and 80s.  More recently was going down in the 50s once he started his flecainide.   5. History of cardioversion  Z98.890    6. Urinary stream slowing  R39.198 OUTSIDE CONSULT/REFERRAL PROVIDER(AMB)     PLAN:   I reviewed appropriate screenings and HCM with patient.   Orders Placed This Encounter   . ALT (SGPT)   . AST (SGOT)   . BASIC METABOLIC PANEL, FASTING   . LIPID PANEL   . CBC/DIFF   . OUTSIDE CONSULT/REFERRAL PROVIDER(AMB)   . simvastatin (ZOCOR) 20 mg Oral Tablet   . furosemide (LASIX) 40 mg Oral Tablet   . potassium chloride (KLOR-CON) 10 mEq Oral Tablet Sustained Release   . lisinopriL (PRINIVIL) 40 mg Oral Tablet     Medication: continue current medication regimen unchanged  Return in about 6 months (around 09/22/2019) for blood work first.

## 2019-03-27 ENCOUNTER — Telehealth (INDEPENDENT_AMBULATORY_CARE_PROVIDER_SITE_OTHER): Payer: Self-pay | Admitting: Family

## 2019-03-27 NOTE — Telephone Encounter (Signed)
Called in stating he went to his PCP dr Amada Jupiter and he started to take his fleccaid on his on cause his hr was up  She advise him to call cardiology to adjust his meds and find out if he still needs the cardizem  You saw him last  advise

## 2019-03-27 NOTE — Telephone Encounter (Signed)
To come in tomorrow for EKG and BP check before making any adjustments

## 2019-03-28 ENCOUNTER — Other Ambulatory Visit: Payer: Self-pay

## 2019-03-28 ENCOUNTER — Other Ambulatory Visit (INDEPENDENT_AMBULATORY_CARE_PROVIDER_SITE_OTHER): Payer: Self-pay

## 2019-03-28 ENCOUNTER — Ambulatory Visit (INDEPENDENT_AMBULATORY_CARE_PROVIDER_SITE_OTHER): Payer: No Typology Code available for payment source

## 2019-03-28 VITALS — BP 128/68 | HR 60 | Temp 97.2°F

## 2019-03-28 DIAGNOSIS — I4891 Unspecified atrial fibrillation: Secondary | ICD-10-CM

## 2019-03-28 NOTE — Nursing Note (Signed)
Reviewed finding with Thomas [name]  He is to restart his fleccainde and to remain on same dose of cardizem and call with any issues  accepts and understands

## 2019-03-28 NOTE — Telephone Encounter (Signed)
Pt had EKG and BP check done was in nsr BP 128/68  Placed back on fleccanide and cardizem dose was unchanged

## 2019-04-02 ENCOUNTER — Other Ambulatory Visit (INDEPENDENT_AMBULATORY_CARE_PROVIDER_SITE_OTHER): Payer: Self-pay | Admitting: Family Medicine

## 2019-04-03 ENCOUNTER — Other Ambulatory Visit (INDEPENDENT_AMBULATORY_CARE_PROVIDER_SITE_OTHER): Payer: Self-pay

## 2019-04-03 DIAGNOSIS — Z0000 Encounter for general adult medical examination without abnormal findings: Secondary | ICD-10-CM

## 2019-04-03 DIAGNOSIS — Z136 Encounter for screening for cardiovascular disorders: Secondary | ICD-10-CM

## 2019-04-03 NOTE — Telephone Encounter (Signed)
Last Visit:03/24/2019     Upcoming appointments: 04/03/2019           Kathryne Gin, RN  04/03/2019, 15:08

## 2019-04-04 ENCOUNTER — Telehealth (INDEPENDENT_AMBULATORY_CARE_PROVIDER_SITE_OTHER): Payer: Self-pay | Admitting: Nurse Practitioner

## 2019-04-04 NOTE — Telephone Encounter (Signed)
-----   Message from [place], [place] sent at 04/03/2019  5:10 PM EDT -----  Please notify the patient that his abdominal ultrasound does show a very small abdominal aortic aneurysm.  According to the small size, this does need to be monitored by abdominal ultrasound again in 3 years.  Please set reminder.

## 2019-04-04 NOTE — Telephone Encounter (Signed)
Called patient, no answer, LMOM for patient to call office back.

## 2019-04-11 NOTE — Telephone Encounter (Signed)
Call back from patient, advised of results states understanding.  Reminder set.

## 2019-04-17 ENCOUNTER — Ambulatory Visit (INDEPENDENT_AMBULATORY_CARE_PROVIDER_SITE_OTHER): Payer: Medicare HMO | Admitting: Family Medicine

## 2019-04-17 ENCOUNTER — Other Ambulatory Visit: Payer: Self-pay

## 2019-04-17 ENCOUNTER — Encounter: Payer: Self-pay | Admitting: Family Medicine

## 2019-04-17 DIAGNOSIS — M1712 Unilateral primary osteoarthritis, left knee: Secondary | ICD-10-CM | POA: Diagnosis not present

## 2019-04-17 NOTE — Assessment & Plan Note (Signed)
Patient given injection today, discussed icing regimen, home exercise, which activities to do which wants to avoid.  Patient is to increase activity as tolerated.  Patient may have an increase with home exercise, follow-up with me again in 4 to 8 weeks

## 2019-04-17 NOTE — Progress Notes (Signed)
[name] [address], [place] [address] Phone: [phone] Subjective:   Tanner [name], am serving as a scribe for Dr. Hulan Saas. I'm seeing this patient by the request  of:    CC: Left knee pain follow-up  [id number]   03/01/2019 Patient has had this for quite some time.  Patient was holding off in any type of surgical intervention secondary to patient's wife being sick.  Patient is considering the possibility of surgery at this time and will be referred.  We discussed otherwise viscosupplementation or PRP.  Patient will consider these things.  Follow-up again in 4 to 6 weeks.  Update 04/17/2019 Tanner [name] is a 73 y.o. male coming in with complaint of left knee pain. Patient states that his knee is sore and stiff. Patient had 3 weeks of relief from last injection he states.         Past Medical History:  Diagnosis Date  . Acute kidney failure ([place])   . Arthritis   . Back pain   . BPH (benign prostatic hypertrophy)   . Clostridium difficile infection   . Clotting disorder ([place])   . Depression   . Diabetes ([place]) 12/11/2016  . DVT (deep venous thrombosis) ([place])   . DVT of deep femoral vein, right ([place]) 06/29/2018  . Hypertension   . Low back pain potentially associated with spinal stenosis   . Neuromuscular disorder ([place])   . Neuropathy of lower extremity    bilateral  . OSA (obstructive sleep apnea)   . PE (pulmonary embolism)   . Ventral hernia    Past Surgical History:  Procedure Laterality Date  . EYE SURGERY    . HERNIA REPAIR     Social History   Socioeconomic History  . Marital status: Married    Spouse name: Not on file  . Number of children: 2  . Years of education: Not on file  . Highest education level: Not on file  Occupational History  . Occupation: Retired  Scientific laboratory technician  . Financial resource strain: Not on file  . Food insecurity    Worry: Not on file    Inability: Not on file  .  Transportation needs    Medical: Not on file    Non-medical: Not on file  Tobacco Use  . Smoking status: Former Smoker    Packs/day: 2.00    Years: 33.00    Pack years: 66.00    Types: Cigarettes    Start date: 08/30/1968    Quit date: 07/02/2001    Years since quitting: 17.8  . Smokeless tobacco: Never Used  . Tobacco comment: quit 12 years ago  Substance and Sexual Activity  . Alcohol use: Yes    Comment: occasional  . Drug use: [name]  . Sexual activity: Not Currently  Lifestyle  . Physical activity    Days per week: Not on file    Minutes per session: Not on file  . Stress: Not on file  Relationships  . Social Herbalist on phone: Not on file    Gets together: Not on file    Attends religious service: Not on file    Active member of club or organization: Not on file    Attends meetings of clubs or organizations: Not on file    Relationship status: Not on file  Other Topics Concern  . 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    Allergies  Allergen Reactions  . Codeine Sulfate Itching and Nausea Only   Family History  Problem Relation Age of Onset  . Diabetes Mother   . Pneumonia Mother   . Alzheimer's disease Mother   . Other Father 33       Drowned on boating accident  . Colon cancer Neg Hx   . Colon polyps Neg Hx      Current Outpatient Medications (Cardiovascular):  .  atenolol (TENORMIN) 25 MG tablet, Take 12.5 mg by mouth daily. .  hydrochlorothiazide (HYDRODIURIL) 25 MG tablet, Take 25 mg by mouth daily.   Current Outpatient Medications (Analgesics):  .  acetaminophen (TYLENOL) 500 MG tablet, Take 1,000 mg by mouth as needed. Marland Kitchen  allopurinol (ZYLOPRIM) 100 MG tablet, Take 2 tablets (200 mg total) by mouth daily. .  traMADol (ULTRAM) 50 MG tablet, Take 100 mg by mouth 3 (three) times daily  as needed.   Current Outpatient Medications (Hematological):  .  apixaban (ELIQUIS) 5 MG TABS tablet, Take 1 tablet (5 mg total) by mouth 2 (two) times daily.  Current Outpatient Medications (Other):  Marland Kitchen  buPROPion (WELLBUTRIN SR) 150 MG 12 hr tablet, Take 150 mg by mouth 2 (two) times daily. .  finasteride (PROSCAR) 5 MG tablet, Take 5 mg by mouth at bedtime. .  gabapentin (NEURONTIN) 300 MG capsule, 600 mg. Take 2 tablets three daily AND 300 MG AT BEDTIME .  ketoconazole (NIZORAL) 2 % shampoo, Apply topically daily. .  Menthol-Methyl Salicylate (THERA-GESIC PLUS) CREA, Apply 1 application topically at bedtime. Apply to lower back .  Multiple Vitamins-Minerals (MULTIVITAMINS THER. W/MINERALS) TABS, Take 1 tablet by mouth daily. .  nortriptyline (PAMELOR) 10 MG capsule, 2 CAPSULES EVERY NIGHT AT BEDTIME .  saccharomyces boulardii (FLORASTOR) 250 MG capsule, Take 1 capsule (250 mg total) by mouth 2 (two) times daily. Marland Kitchen  zolpidem (AMBIEN) 10 MG tablet, Take 1 tablet (10 mg total) by mouth at bedtime. For sleep    Past medical history, social, surgical and family history all reviewed in electronic medical record.  [name] pertanent information unless stated regarding to the chief complaint.   Review of Systems:  [name] headache, visual changes, nausea, vomiting, diarrhea, constipation, dizziness, abdominal pain, skin rash, fevers, chills, night sweats, weight loss, swollen lymph nodes,  chest pain, shortness of breath, mood changes.  Positive muscle aches + body aches   Objective  Blood pressure 124/68, pulse 80, height 5\' 7"  (1.702 m), weight 283 lb (128.4 kg), SpO2 94 %.    General: [name] apparent distress alert and oriented x3 mood and affect normal, dressed appropriately.  HEENT: Pupils equal, extraocular movements intact  Respiratory: Patient's speak in full sentences and does not appear short of breath  Cardiovascular: [name] lower extremity edema, non tender, [name] erythema  Skin: Warm dry intact with  [name] signs of infection or rash on extremities or on axial skeleton.  Abdomen: Soft nontender  Neuro: Cranial nerves II through XII are intact, neurovascularly intact in all extremities with 2+ DTRs and 2+ pulses.  Lymph: [name] lymphadenopathy of posterior or anterior cervical chain or axillae bilaterally.  Gait antalgic MSK:  tender with limited range of motion and stability and symmetric strength and tone of shoulders, elbows, wrist, hip and ankles bilaterally.   Knee: Left valgus deformity noted. Large thigh  to calf ratio.  Tender to palpation over medial and PF joint line.  ROM full in flexion and extension and lower leg rotation. instability with valgus force.  painful patellar compression. Patellar glide with moderate crepitus. Patellar and quadriceps tendons unremarkable. Hamstring and quadriceps strength is normal.  After informed written and verbal consent, patient was seated on exam table. Right knee was prepped with alcohol swab and utilizing anterolateral approach, patient's right knee space was injected with 22mg /mL of Monovisc (sodium hyaluronate) in a prefilled syringe was injected easily into the knee through a 22-gauge needle..Patient tolerated the procedure well without immediate complications.    Impression and Recommendations:      The above documentation has been reviewed and is accurate and complete Lyndal Pulley, DO       Note: This dictation was prepared with Dragon dictation along with smaller phrase technology. Any transcriptional errors that result from this process are unintentional.

## 2019-04-17 NOTE — Patient Instructions (Signed)
See me in 8 weeks  

## 2019-05-25 ENCOUNTER — Telehealth (INDEPENDENT_AMBULATORY_CARE_PROVIDER_SITE_OTHER): Payer: Self-pay | Admitting: Family

## 2019-05-25 ENCOUNTER — Other Ambulatory Visit (INDEPENDENT_AMBULATORY_CARE_PROVIDER_SITE_OTHER): Payer: Self-pay | Admitting: Family

## 2019-05-25 MED ORDER — DILTIAZEM CD 180 MG CAPSULE,EXTENDED RELEASE 24 HR
180.0000 mg | ORAL_CAPSULE | Freq: Every day | ORAL | 4 refills | Status: DC
Start: 2019-05-25 — End: 2019-08-11

## 2019-05-25 NOTE — Nursing Note (Signed)
Called in stating his BP has been somewhat elevated  138/96 149/94 139/94  Was just wondering if he needed to adjust his meds

## 2019-06-12 ENCOUNTER — Other Ambulatory Visit: Payer: Self-pay

## 2019-06-12 ENCOUNTER — Encounter: Payer: Self-pay | Admitting: Family Medicine

## 2019-06-12 ENCOUNTER — Ambulatory Visit: Payer: Medicare HMO | Admitting: Family Medicine

## 2019-06-12 VITALS — BP 140/82 | HR 78 | Ht 67.0 in | Wt 290.0 lb

## 2019-06-12 DIAGNOSIS — M1712 Unilateral primary osteoarthritis, left knee: Secondary | ICD-10-CM | POA: Diagnosis not present

## 2019-06-12 DIAGNOSIS — M25562 Pain in left knee: Secondary | ICD-10-CM | POA: Diagnosis not present

## 2019-06-12 DIAGNOSIS — G8929 Other chronic pain: Secondary | ICD-10-CM | POA: Diagnosis not present

## 2019-06-12 NOTE — Patient Instructions (Addendum)
Good to see you Tanner [name] at [place], [place] [address] [phone] Pennsaid small amount over the most painful spot on the knee See me again in 2 months weeks

## 2019-06-12 NOTE — Progress Notes (Signed)
[name] [address] [place], [place] [address] Phone: [phone] Subjective:   I Tanner [name] am serving as a Education administrator for Dr. Hulan Saas.  This visit occurred during the SARS-CoV-2 public health emergency.  Safety protocols were in place, including screening questions prior to the visit, additional usage of staff PPE, and extensive cleaning of exam room while observing appropriate contact time as indicated for disinfecting solutions.      CC: Left leg pain follow-up  [id number]   04/17/2019 Patient given injection today, discussed icing regimen, home exercise, which activities to do which wants to avoid.  Patient is to increase activity as tolerated.  Patient may have an increase with home exercise, follow-up with me again in 4 to 8 weeks  Update 06/12/2019 Tanner [name] is a 73 y.o. male coming in with complaint of left knee pain. Patient states his knee is sore.  Patient was given viscosupplementation 4 weeks ago. He had increasing discomfort again fairly quickly.  Patient is having some increasing instability.  Patient is wondering what else can possibly done.  Patient was the primary caregiver for his wife who recently passed away.  Patient is ready to focus more than self.     Past Medical History:  Diagnosis Date  . Acute kidney failure ([place])   . Arthritis   . Back pain   . BPH (benign prostatic hypertrophy)   . Clostridium difficile infection   . Clotting disorder ([place])   . Depression   . Diabetes ([place]) 12/11/2016  . DVT (deep venous thrombosis) ([place])   . DVT of deep femoral vein, right ([place]) 06/29/2018  . Hypertension   . Low back pain potentially associated with spinal stenosis   . Neuromuscular disorder ([place])   . Neuropathy of lower extremity    bilateral  . OSA (obstructive sleep apnea)   . PE (pulmonary embolism)   . Ventral hernia    Past Surgical History:  Procedure Laterality Date  . EYE SURGERY    . HERNIA REPAIR      Social History   Socioeconomic History  . Marital status: Married    Spouse name: Not on file  . Number of children: 2  . Years of education: Not on file  . Highest education level: Not on file  Occupational History  . Occupation: Retired  Tobacco Use  . Smoking status: Former Smoker    Packs/day: 2.00    Years: 33.00    Pack years: 66.00    Types: Cigarettes    Start date: 08/30/1968    Quit date: 07/02/2001    Years since quitting: 17.9  . Smokeless tobacco: Never Used  . Tobacco comment: quit 12 years ago  Substance and Sexual Activity  . Alcohol use: Yes    Comment: occasional  . Drug use: No  . Sexual activity: Not Currently  Other Topics Concern  . 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    Social Determinants of Health   Financial Resource Strain:   . Difficulty of Paying Living Expenses: Not on file  Food Insecurity:   . Worried About Charity fundraiser in the Last Year: Not on file  . Ran Out of Food in the Last Year: Not on file  Transportation Needs:   . Film/video editor (Medical): Not on file  . Lack of Transportation (Non-Medical): Not on file  Physical Activity:   . Days of Exercise per Week: Not on file  . Minutes of Exercise per Session: Not on file  Stress:   . Feeling of Stress : Not on file  Social Connections:   . Frequency of Communication with Friends and Family: Not on file  . Frequency of Social Gatherings with Friends and Family: Not on file  . Attends Religious Services: Not on file  . Active Member of Clubs or Organizations: Not on file  . Attends Archivist Meetings: Not on file  . Marital Status: Not on file   Allergies  Allergen Reactions  . Codeine Sulfate Itching and Nausea Only   Family History  Problem Relation Age of Onset  . Diabetes Mother   .  Pneumonia Mother   . Alzheimer's disease Mother   . Other Father 20       Drowned on boating accident  . Colon cancer Neg Hx   . Colon polyps Neg Hx      Current Outpatient Medications (Cardiovascular):  .  atenolol (TENORMIN) 25 MG tablet, Take 12.5 mg by mouth daily. .  hydrochlorothiazide (HYDRODIURIL) 25 MG tablet, Take 25 mg by mouth daily.   Current Outpatient Medications (Analgesics):  .  acetaminophen (TYLENOL) 500 MG tablet, Take 1,000 mg by mouth as needed. Marland Kitchen  allopurinol (ZYLOPRIM) 100 MG tablet, Take 2 tablets (200 mg total) by mouth daily. .  traMADol (ULTRAM) 50 MG tablet, Take 100 mg by mouth 3 (three) times daily as needed.   Current Outpatient Medications (Hematological):  .  apixaban (ELIQUIS) 5 MG TABS tablet, Take 1 tablet (5 mg total) by mouth 2 (two) times daily.  Current Outpatient Medications (Other):  Marland Kitchen  buPROPion (WELLBUTRIN SR) 150 MG 12 hr tablet, Take 150 mg by mouth 2 (two) times daily. .  finasteride (PROSCAR) 5 MG tablet, Take 5 mg by mouth at bedtime. .  gabapentin (NEURONTIN) 300 MG capsule, 600 mg. Take 2 tablets three daily AND 300 MG AT BEDTIME .  ketoconazole (NIZORAL) 2 % shampoo, Apply topically daily. .  Menthol-Methyl Salicylate (THERA-GESIC PLUS) CREA, Apply 1 application topically at bedtime. Apply to lower back .  Multiple Vitamins-Minerals (MULTIVITAMINS THER. W/MINERALS) TABS, Take 1 tablet by mouth daily. .  nortriptyline (PAMELOR) 10 MG capsule, 2 CAPSULES EVERY NIGHT AT BEDTIME .  saccharomyces boulardii (FLORASTOR) 250 MG capsule, Take 1 capsule (250 mg total) by mouth 2 (two) times daily. Marland Kitchen  zolpidem (AMBIEN) 10 MG tablet, Take 1 tablet (10 mg total) by mouth at bedtime. For sleep    Past medical history, social, surgical and family history all reviewed in electronic medical record.  No pertanent information unless stated regarding to the chief complaint.   Review of Systems:  No headache, visual changes, nausea, vomiting,  diarrhea, constipation, dizziness, abdominal pain, skin rash, fevers, chills, night sweats, weight loss, swollen lymph nodes, body aches, joint swelling, muscle aches, chest pain, shortness of breath, mood changes.   Objective  Blood pressure 140/82, pulse 78, height 5\' 7"  (1.702 m), weight 290 lb (131.5 kg), SpO2 91 %.    General: No apparent distress alert and oriented x3 mood and affect normal, dressed appropriately.  Bell's palsy noted HEENT: Pupils equal, extraocular movements intact  Respiratory: Patient's speak in full sentences and does not appear short of breath  Cardiovascular: No lower extremity edema, non  tender, no erythema  Skin: Warm dry intact with no signs of infection or rash on extremities or on axial skeleton.  Abdomen: Soft nontender  Neuro: Cranial nerves II through XII are intact, neurovascularly intact in all extremities with 2+ DTRs and 2+ pulses.  Lymph: No lymphadenopathy of posterior or anterior cervical chain or axillae bilaterally.  Gait antalgic gait MSK: Arthritic changes in multiple joints Knee: Left  valgus deformity noted. Large thigh to calf ratio.  Tender to palpation over medial and PF joint line.  ROM full in flexion and extension and lower leg rotation. instability with valgus force.  painful patellar compression. Patellar glide with moderate crepitus. Patellar and quadriceps tendons unremarkable. Hamstring and quadriceps strength is normal.  After informed written and verbal consent, patient was seated on exam table. Left knee was prepped with alcohol swab and utilizing anterolateral approach, patient's left knee space was injected with 4:1  marcaine 0.5%: Kenalog 40mg /dL. Patient tolerated the procedure well without immediate complications.    Impression and Recommendations:     This case required medical decision making of moderate complexity. The above documentation has been reviewed and is accurate and complete Tanner Pulley, DO        Note: This dictation was prepared with Dragon dictation along with smaller phrase technology. Any transcriptional errors that result from this process are unintentional.

## 2019-06-12 NOTE — Assessment & Plan Note (Signed)
Viscosupplementation did not seem to make significant improvement.  Steroid injection given again today.  Discussed with patient that otherwise surgical intervention is necessary.  Patient is no longer being the primary caregiver for his wife and is able to focus more on himself.  Patient will be referred today to orthopedic surgery to discuss potential replacement.

## 2019-06-27 ENCOUNTER — Encounter (INDEPENDENT_AMBULATORY_CARE_PROVIDER_SITE_OTHER): Payer: Self-pay | Admitting: Family Medicine

## 2019-06-29 DIAGNOSIS — M1712 Unilateral primary osteoarthritis, left knee: Secondary | ICD-10-CM | POA: Diagnosis not present

## 2019-06-29 DIAGNOSIS — M25562 Pain in left knee: Secondary | ICD-10-CM | POA: Diagnosis not present

## 2019-07-05 ENCOUNTER — Encounter: Payer: Self-pay | Admitting: Hematology & Oncology

## 2019-07-05 ENCOUNTER — Inpatient Hospital Stay: Payer: Medicare HMO | Attending: Hematology & Oncology | Admitting: Hematology & Oncology

## 2019-07-05 ENCOUNTER — Inpatient Hospital Stay: Payer: Medicare HMO

## 2019-07-05 ENCOUNTER — Telehealth: Payer: Self-pay | Admitting: Hematology & Oncology

## 2019-07-05 ENCOUNTER — Other Ambulatory Visit: Payer: Self-pay

## 2019-07-05 VITALS — BP 133/75 | HR 77 | Temp 96.8°F | Resp 18 | Wt 285.2 lb

## 2019-07-05 DIAGNOSIS — I2699 Other pulmonary embolism without acute cor pulmonale: Secondary | ICD-10-CM | POA: Insufficient documentation

## 2019-07-05 DIAGNOSIS — Z7901 Long term (current) use of anticoagulants: Secondary | ICD-10-CM | POA: Insufficient documentation

## 2019-07-05 DIAGNOSIS — E119 Type 2 diabetes mellitus without complications: Secondary | ICD-10-CM | POA: Insufficient documentation

## 2019-07-05 DIAGNOSIS — G629 Polyneuropathy, unspecified: Secondary | ICD-10-CM | POA: Insufficient documentation

## 2019-07-05 DIAGNOSIS — E0800 Diabetes mellitus due to underlying condition with hyperosmolarity without nonketotic hyperglycemic-hyperosmolar coma (NKHHC): Secondary | ICD-10-CM

## 2019-07-05 DIAGNOSIS — I82502 Chronic embolism and thrombosis of unspecified deep veins of left lower extremity: Secondary | ICD-10-CM | POA: Diagnosis not present

## 2019-07-05 DIAGNOSIS — I82411 Acute embolism and thrombosis of right femoral vein: Secondary | ICD-10-CM

## 2019-07-05 LAB — CMP (CANCER CENTER ONLY)
ALT: 18 U/L (ref 0–44)
AST: 15 U/L (ref 15–41)
Albumin: 4.2 g/dL (ref 3.5–5.0)
Alkaline Phosphatase: 98 U/L (ref 38–126)
Anion gap: 7 (ref 5–15)
BUN: 19 mg/dL (ref 8–23)
CO2: 34 mmol/L — ABNORMAL HIGH (ref 22–32)
Calcium: 9.6 mg/dL (ref 8.9–10.3)
Chloride: 98 mmol/L (ref 98–111)
Creatinine: 1.57 mg/dL — ABNORMAL HIGH (ref 0.61–1.24)
GFR, Est AFR Am: 50 mL/min — ABNORMAL LOW (ref >60)
GFR, Estimated: 43 mL/min — ABNORMAL LOW (ref >60)
Glucose, Bld: 173 mg/dL — ABNORMAL HIGH (ref 70–99)
Potassium: 3.8 mmol/L (ref 3.5–5.1)
Sodium: 139 mmol/L (ref 135–145)
Total Bilirubin: 0.8 mg/dL (ref 0.3–1.2)
Total Protein: 6.5 g/dL (ref 6.5–8.1)

## 2019-07-05 LAB — CBC WITH DIFFERENTIAL (CANCER CENTER ONLY)
Abs Immature Granulocytes: 0.05 10*3/uL (ref 0.00–0.07)
Basophils Absolute: 0.1 10*3/uL (ref 0.0–0.1)
Basophils Relative: 1 %
Eosinophils Absolute: 0.2 10*3/uL (ref 0.0–0.5)
Eosinophils Relative: 2 %
HCT: 46.5 % (ref 39.0–52.0)
Hemoglobin: 15.5 g/dL (ref 13.0–17.0)
Immature Granulocytes: 1 %
Lymphocytes Relative: 22 %
Lymphs Abs: 2 10*3/uL (ref 0.7–4.0)
MCH: 29.1 pg (ref 26.0–34.0)
MCHC: 33.3 g/dL (ref 30.0–36.0)
MCV: 87.4 fL (ref 80.0–100.0)
Monocytes Absolute: 0.6 10*3/uL (ref 0.1–1.0)
Monocytes Relative: 6 %
Neutro Abs: 6.2 10*3/uL (ref 1.7–7.7)
Neutrophils Relative %: 68 %
Platelet Count: 192 10*3/uL (ref 150–400)
RBC: 5.32 MIL/uL (ref 4.22–5.81)
RDW: 14.5 % (ref 11.5–15.5)
WBC Count: 9.1 10*3/uL (ref 4.0–10.5)
nRBC: 0 % (ref 0.0–0.2)

## 2019-07-05 LAB — HEMOGLOBIN A1C
Hgb A1c MFr Bld: 7.5 % — ABNORMAL HIGH (ref 4.8–5.6)
Mean Plasma Glucose: 168.55 mg/dL

## 2019-07-05 NOTE — Telephone Encounter (Signed)
Appointments scheduled calendar printed per 1/20 los

## 2019-07-05 NOTE — Progress Notes (Signed)
Hematology and Oncology Follow Up Visit  Tanner [name] [id number] 1946/01/20 74 y.o. 07/05/2019   Principle Diagnosis:  Recurrent pulmonary emboli/left leg thrombus -- Idiopathic  Current Therapy:   Eliquis 5 mg p.o. twice daily - started 06/29/2017   Interim History: Tanner [name] is here today for follow-up.  For right now, everything is pretty much stable with him.  He is going need surgery for his left knee.  The problem is that he needs to lose more weight.  Is now been 7 months since his wife passed on.  She had very poor pulmonary function.  I think she also had congestive heart failure.  He has had no problems with bleeding.  There is been no problems with cough.  He has had issues with his diabetes.  He has neuropathy.  He goes to the [facility] hospital.  He is try to get disability for the neuropathy.  I think he had exposure to Agent Orange.  He has had no headache.  Overall, his performance status is ECOG 1.    Medications:  Allergies as of 07/05/2019      Reactions   Codeine Sulfate Itching, Nausea Only      Medication List       Accurate as of July 05, 2019 12:55 PM. If you have any questions, ask your nurse or doctor.        acetaminophen 500 MG tablet Commonly known as: TYLENOL Take 1,000 mg by mouth as needed.   allopurinol 100 MG tablet Commonly known as: ZYLOPRIM Take 2 tablets (200 mg total) by mouth daily.   apixaban 5 MG Tabs tablet Commonly known as: Eliquis Take 1 tablet (5 mg total) by mouth 2 (two) times daily.   atenolol 25 MG tablet Commonly known as: TENORMIN Take 12.5 mg by mouth daily.   buPROPion 150 MG 12 hr tablet Commonly known as: WELLBUTRIN SR Take 150 mg by mouth 2 (two) times daily.   finasteride 5 MG tablet Commonly known as: PROSCAR Take 5 mg by mouth at bedtime.   gabapentin 300 MG capsule Commonly known as: NEURONTIN 600 mg. Take 2 tablets three daily AND 300 MG AT BEDTIME   hydrochlorothiazide 25 MG tablet Commonly  known as: HYDRODIURIL Take 25 mg by mouth daily.   ketoconazole 2 % shampoo Commonly known as: NIZORAL Apply topically daily.   multivitamins ther. w/minerals Tabs tablet Take 1 tablet by mouth daily.   nortriptyline 10 MG capsule Commonly known as: PAMELOR 2 CAPSULES EVERY NIGHT AT BEDTIME   saccharomyces boulardii 250 MG capsule Commonly known as: FLORASTOR Take 1 capsule (250 mg total) by mouth 2 (two) times daily.   Thera-Gesic Plus Crea Apply 1 application topically at bedtime. Apply to lower back   traMADol 50 MG tablet Commonly known as: ULTRAM Take 100 mg by mouth 3 (three) times daily as needed.   zolpidem 10 MG tablet Commonly known as: AMBIEN Take 1 tablet (10 mg total) by mouth at bedtime. For sleep       Allergies:  Allergies  Allergen Reactions  . Codeine Sulfate Itching and Nausea Only    Past Medical History, Surgical history, Social history, and Family History were reviewed and updated.  Review of Systems: Review of Systems  Constitutional: Negative.   HENT: Negative.   Eyes: Negative.   Respiratory: Negative.   Cardiovascular: Negative.   Gastrointestinal: Negative.   Genitourinary: Negative.   Musculoskeletal: Negative.   Skin: Negative.   Neurological: Negative.   Endo/Heme/Allergies: Negative.  Psychiatric/Behavioral: Negative.      Physical Exam:  weight is 285 lb 4 oz (129.4 kg). His temporal temperature is 96.8 F (36 C) (abnormal). His blood pressure is 133/75 and his pulse is 77. His respiration is 18 and oxygen saturation is 94%.   Wt Readings from Last 3 Encounters:  07/05/19 285 lb 4 oz (129.4 kg)  06/12/19 290 lb (131.5 kg)  04/17/19 283 lb (128.4 kg)    Physical Exam Vitals reviewed.  HENT:     Head: Normocephalic and atraumatic.  Eyes:     Pupils: Pupils are equal, round, and reactive to light.  Cardiovascular:     Rate and Rhythm: Normal rate and regular rhythm.     Heart sounds: Normal heart sounds.    Pulmonary:     Effort: Pulmonary effort is normal.     Breath sounds: Normal breath sounds.  Abdominal:     General: Bowel sounds are normal.     Palpations: Abdomen is soft.  Musculoskeletal:        General: No tenderness or deformity. Normal range of motion.     Cervical back: Normal range of motion.  Lymphadenopathy:     Cervical: No cervical adenopathy.  Skin:    General: Skin is warm and dry.     Findings: No erythema or rash.  Neurological:     Mental Status: He is alert and oriented to person, place, and time.  Psychiatric:        Behavior: Behavior normal.        Thought Content: Thought content normal.        Judgment: Judgment normal.      Lab Results  Component Value Date   WBC 9.1 07/05/2019   HGB 15.5 07/05/2019   HCT 46.5 07/05/2019   MCV 87.4 07/05/2019   PLT 192 07/05/2019   Lab Results  Component Value Date   IRON 80 11/29/2017   IRONPCTSAT 22.9 11/29/2017   Lab Results  Component Value Date   RBC 5.32 07/05/2019   No results found for: KPAFRELGTCHN, LAMBDASER, [facility] Lab Results  Component Value Date   IGGSERUM 549 (L) 04/07/2017   IGMSERUM 144 04/07/2017   Lab Results  Component Value Date   ALBUMINELP 3.7 (L) 04/07/2017   A1GS 0.4 (H) 04/07/2017   A2GS 0.8 04/07/2017   BETS 0.5 04/07/2017   BETA2SER 0.3 04/07/2017   GAMS 0.6 (L) 04/07/2017   SPEI  04/07/2017     Comment:     . One or more serum protein fractions are outside the normal ranges.  No abnormal protein bands are apparent. .      Chemistry      Component Value Date/Time   NA 139 07/05/2019 1112   NA 140 11/07/2014 0000   NA 136 12/29/2013 1356   K 3.8 07/05/2019 1112   K 3.4 12/29/2013 1356   CL 98 07/05/2019 1112   CL 97 (L) 12/29/2013 1356   CO2 34 (H) 07/05/2019 1112   CO2 33 12/29/2013 1356   BUN 19 07/05/2019 1112   BUN 18 11/07/2014 0000   BUN 15 12/29/2013 1356   CREATININE 1.57 (H) 07/05/2019 1112   CREATININE 1.2 12/29/2013 1356   GLU 84  11/07/2014 0000      Component Value Date/Time   CALCIUM 9.6 07/05/2019 1112   CALCIUM 8.9 12/29/2013 1356   ALKPHOS 98 07/05/2019 1112   ALKPHOS 59 12/29/2013 1356   AST 15 07/05/2019 1112   ALT 18 07/05/2019 1112  ALT 27 12/29/2013 1356   BILITOT 0.8 07/05/2019 1112      Impression and Plan: Mr. Rattler is a very pleasant 74 yo caucasian gentleman with recurrent Pulmonary embolism/thromboembolic disease.  He is on lifelong Eliquis.  I do not think we have to do any scans on him.  I do not think we need any Dopplers right now.  He has a chronic nonocclusive thrombus in his left leg.  If he ever has surgery for the left knee, I will see any problems with him having this.  I am would to stop his Eliquis for a couple days.  I suppose that to make things safer for him, we can was have a temporary IVC filter placed.  I told him to make sure he lets [name] know if he is going to have knee surgery.    We will go ahead and plan to see him back in 6 months.  I think this would be reasonable at this point. We can certainly see him sooner if need be.    Volanda Napoleon, MD 1/20/202112:55 PM

## 2019-07-06 ENCOUNTER — Telehealth: Payer: Self-pay | Admitting: *Deleted

## 2019-07-06 NOTE — Telephone Encounter (Signed)
-----   Message from Volanda Napoleon, MD sent at 07/05/2019  4:31 PM EST ----- Please fax this lab result to [phone].  Thanks!!  Laurey Arrow

## 2019-07-06 NOTE — Telephone Encounter (Signed)
As noted below by Dr. Marin Olp, I faxed the labs to [phone]. It's going to [facility], which is part of the [place]. Patient is aware that I faxed them.

## 2019-07-10 ENCOUNTER — Ambulatory Visit (INDEPENDENT_AMBULATORY_CARE_PROVIDER_SITE_OTHER): Payer: Self-pay | Admitting: Family Medicine

## 2019-07-10 NOTE — Telephone Encounter (Signed)
Regarding: covid vaccine questions  ----- Message from Marko Stai sent at 07/10/2019 12:46 PM EST -----  Rhoderick Moody, DO    Pt has questions regarding the Covid vaccine .please call back advise.    Thank You Rene Kocher

## 2019-07-10 NOTE — Telephone Encounter (Signed)
Left message to call office back

## 2019-07-14 ENCOUNTER — Telehealth: Payer: Self-pay | Admitting: Family Medicine

## 2019-07-14 NOTE — Telephone Encounter (Signed)
Sonal - pharmacist from [facility] called regarding the [place] approval. She said that it was approved from Jul 27, 2018 through Oct 25, 2018 but shows that it was given on Apr 17, 2019. Case # [id number] They recently received another prior authorization from Dr Frederik Pear for [facility] (Case # [id number]). They are confused because they were told that it was last given 6 months ago, but does not seem to be the case. Can you help with this?  [place] Department # [phone]

## 2019-07-31 ENCOUNTER — Other Ambulatory Visit: Payer: Self-pay

## 2019-07-31 ENCOUNTER — Ambulatory Visit: Payer: Medicare HMO | Attending: Internal Medicine

## 2019-07-31 DIAGNOSIS — Z23 Encounter for immunization: Secondary | ICD-10-CM

## 2019-07-31 NOTE — Progress Notes (Signed)
   Covid-19 Vaccination Clinic  Name:  Tanner [name]    MRN: [id number] DOB: Oct 24, 1945  07/31/2019  Tanner [name] was observed post Covid-19 immunization for 30 minutes based on pre-vaccination screening without incidence. He was provided with Vaccine Information Sheet and instruction to access the V-Safe system.   Tanner [name] was instructed to call 911 with any severe reactions post vaccine: Marland Kitchen Difficulty breathing  . Swelling of your face and throat  . A fast heartbeat  . A bad rash all over your body  . Dizziness and weakness    Immunizations Administered    Name Date Dose VIS Date Route   Moderna COVID-19 Vaccine 07/31/2019 11:49 AM 0.5 mL 05/16/2019 Intramuscular   Manufacturer: Moderna   Lot: [id number]   [id number]

## 2019-08-07 DIAGNOSIS — R69 Illness, unspecified: Secondary | ICD-10-CM | POA: Diagnosis not present

## 2019-08-08 DIAGNOSIS — E1142 Type 2 diabetes mellitus with diabetic polyneuropathy: Secondary | ICD-10-CM | POA: Diagnosis not present

## 2019-08-08 DIAGNOSIS — A0472 Enterocolitis due to Clostridium difficile, not specified as recurrent: Secondary | ICD-10-CM | POA: Diagnosis not present

## 2019-08-08 DIAGNOSIS — R69 Illness, unspecified: Secondary | ICD-10-CM | POA: Diagnosis not present

## 2019-08-08 DIAGNOSIS — N529 Male erectile dysfunction, unspecified: Secondary | ICD-10-CM | POA: Diagnosis not present

## 2019-08-08 DIAGNOSIS — I10 Essential (primary) hypertension: Secondary | ICD-10-CM | POA: Diagnosis not present

## 2019-08-08 DIAGNOSIS — G8929 Other chronic pain: Secondary | ICD-10-CM | POA: Diagnosis not present

## 2019-08-08 DIAGNOSIS — M1990 Unspecified osteoarthritis, unspecified site: Secondary | ICD-10-CM | POA: Diagnosis not present

## 2019-08-08 DIAGNOSIS — N400 Enlarged prostate without lower urinary tract symptoms: Secondary | ICD-10-CM | POA: Diagnosis not present

## 2019-08-11 ENCOUNTER — Ambulatory Visit (INDEPENDENT_AMBULATORY_CARE_PROVIDER_SITE_OTHER): Payer: No Typology Code available for payment source | Admitting: Family

## 2019-08-11 ENCOUNTER — Encounter (INDEPENDENT_AMBULATORY_CARE_PROVIDER_SITE_OTHER): Payer: Self-pay | Admitting: Family

## 2019-08-11 ENCOUNTER — Other Ambulatory Visit: Payer: Self-pay

## 2019-08-11 VITALS — BP 138/80 | HR 70 | Temp 96.8°F | Ht 70.5 in | Wt 259.2 lb

## 2019-08-11 DIAGNOSIS — I10 Essential (primary) hypertension: Secondary | ICD-10-CM

## 2019-08-11 DIAGNOSIS — I4510 Unspecified right bundle-branch block: Secondary | ICD-10-CM

## 2019-08-11 DIAGNOSIS — Z7901 Long term (current) use of anticoagulants: Secondary | ICD-10-CM

## 2019-08-11 DIAGNOSIS — I447 Left bundle-branch block, unspecified: Secondary | ICD-10-CM

## 2019-08-11 DIAGNOSIS — I4892 Unspecified atrial flutter: Secondary | ICD-10-CM

## 2019-08-11 DIAGNOSIS — E782 Mixed hyperlipidemia: Secondary | ICD-10-CM

## 2019-08-11 DIAGNOSIS — I4460 Unspecified fascicular block: Secondary | ICD-10-CM

## 2019-08-11 DIAGNOSIS — I4891 Unspecified atrial fibrillation: Secondary | ICD-10-CM

## 2019-08-11 DIAGNOSIS — Z9889 Other specified postprocedural states: Secondary | ICD-10-CM

## 2019-08-11 MED ORDER — DILTIAZEM CD 240 MG CAPSULE,EXTENDED RELEASE 24 HR
240.0000 mg | ORAL_CAPSULE | Freq: Every day | ORAL | 4 refills | Status: DC
Start: 2019-08-11 — End: 2020-02-13

## 2019-08-11 NOTE — Progress Notes (Signed)
[facility]  CARDIOLOGY, Sharlyne Cai  [address]  [place] [address]  [phone]    Thomas [name] is a 74 y.o. male seen in the office on 08/11/2019    Chief Complaint   Patient presents with   . Follow Up 6 Months     afib         Patient Active Problem List   Diagnosis   . Atrial flutter (CMS HCC)   . Body mass index (BMI) of 33.0 to 33.9 in adult   . Chest tightness   . Essential hypertension   . Hyperlipidemia   . Left bundle branch hemiblock   . Paroxysmal atrial flutter (CMS HCC)   . History of cardioversion   . History of cardiac cath-Overall normal coronary anatomy 12/26/2018       History of Present Illness: This is 74 year old male patient with a past medical history of paroxysmal atrial flutter/fib, hypertension, hyperlipidemia, gout, and chronic shortness of breath-patient is to see pulmonology in this regard. He was seen one time byDr. Kristen Cardinal on05/08/2019secondary to atrial flutter.Holter monitor, echocardiogram, and stress test werecompleted in the past. Patient was seen at our office to establish care.  He was complaining of chest pain that radiated into his left arm and palpitations.  Nuclear stress test and Holter monitor was ordered.  Patient underwent nuclear stress test which returned abnormal.  Holter monitor was never completed.  Cardiac catheterization was completed 12/26/2018 which revealed overall normal coronary anatomy with an estimated ejection fraction of 55%.  Dr. Francie Massing recommended TEE/DC cardioversion as he was in atrial fibrillation/atrial flutter.  TEE was completed revealing an EF of 55-60%.  No evidence of thrombus was identified.  The patient was successfully cardioverted to normal sinus rhythm and initiated on flecainide.  When the patient presented for follow-up following TEE/DC cardioversion he was back in atrial fibrillation.  The patient wished to continue with rate control and anticoagulation.  His  flecainide was discontinued.  Since then, he did convert back to normal sinus and his flecainide was re-initiated.  He has continued to go in and out of atrial fibrillation/flutter since then.    He presents to the office today for routine follow-up.  He presents today with a list of recent blood pressure and heart rate readings.  His pressure has been consistently elevated systolically.  He does report intermittent palpitations, but for the most part has been feeling well.  He tells me he feels he is occasionally going into atrial flutter, but for the most part he believes he is in a normal sinus rhythm.  He reports he has been compliant with his current medicaton regimen.  He denies any signs or symptoms of bleeding with use of Eliquis.  He denies chest pain, shortness of breath, dizziness, syncope or near syncope.  His vital signs are stable.          Current Outpatient Medications   Medication Sig   . acetaminophen (TYLENOL) 500 mg Oral Tablet Take 500 mg by mouth Every 4 hours as needed for Pain (pt takes tylenol pm)   . apixaban (ELIQUIS) 5 mg Oral Tablet Take 5 mg by mouth   . dilTIAZem (CARDIZEM CD) 240 mg Oral Capsule, Sust. Release 24 hr Take 1 Capsule (240 mg total) by mouth Once a day   . furosemide (LASIX) 40 mg Oral Tablet Take 1 Tab (40 mg total) by mouth Once a day   . lisinopriL (PRINIVIL) 40 mg Oral Tablet  Take 1 Tab (40 mg total) by mouth Once a day   . melatonin 5 mg Oral Tablet Take 5 mg by mouth Every night   . nitroGLYCERIN (NITROSTAT) 0.4 mg Sublingual Tablet, Sublingual 1 Tab (0.4 mg total) by Sublingual route Every 5 minutes as needed for Chest pain   . potassium chloride (KLOR-CON) 10 mEq Oral Tablet Sustained Release Take 1 Tab (10 mEq total) by mouth Once a day   . simvastatin (ZOCOR) 20 mg Oral Tablet Take 1 Tab (20 mg total) by mouth Every other day   . VITAMIN E ORAL Take 800 mg by mouth       No Known Allergies  Family Medical History:     Problem Relation (Age of Onset)    Cancer  Mother    Hypertension (High Blood Pressure) Mother, Father            Social History     Socioeconomic History   . Marital status: Widowed     Spouse name: Not on file   . Number of children: Not on file   . Years of education: Not on file   . Highest education level: Not on file   Tobacco Use   . Smoking status: Former Smoker     Years: 60.00     Types: Cigarettes, Cigars   . Smokeless tobacco: Current User     Types: Chew, Snuff   Vaping Use   . Vaping Use: Never used   Substance and Sexual Activity   . Alcohol use: Yes   . Drug use: Never   . Sexual activity: Not Currently     Social Determinants of Health     Financial Resource Strain:    . Difficulty of Paying Living Expenses:    Food Insecurity:    . Worried About Programme researcher, broadcasting/film/video in the Last Year:    . Barista in the Last Year:    Transportation Needs:    . Freight forwarder (Medical):    Marland Kitchen Lack of Transportation (Non-Medical):    Physical Activity:    . Days of Exercise per Week:    . Minutes of Exercise per Session:    Stress:    . Feeling of Stress :    Intimate Partner Violence:    . Fear of Current or Ex-Partner:    . Emotionally Abused:    Marland Kitchen Physically Abused:    . Sexually Abused:          Physical Exam:  BP 138/80 (Site: Left)   Pulse 70   Temp 36 C (96.8 F)   Ht 1.791 m (5' 10.5")   Wt 118 kg (259 lb 3.2 oz)   SpO2 96%   BMI 36.67 kg/m       Body mass index is 36.67 kg/m.  Wt Readings from Last 5 Encounters:   08/11/19 118 kg (259 lb 3.2 oz)   03/24/19 118 kg (260 lb 8 oz)   03/17/19 116 kg (256 lb 2 oz)   02/07/19 114 kg (252 lb)   11/25/18 69.9 kg (154 lb)     The patient is in no distress. Skin is warm and dry. There is no neck vein distention. No carotid bruits heard. The lungs are clear bilaterally. The heart is irregular without any significant murmur, gallop,rub,or click. Bowel sounds are positive. The abdomen is soft and nontender. The aorta is not palpable. Extremities show no significant edema.  Distal pulses are  intact.  PERTINENT DIAGNOSTICS: EKG today reveals atrial flutter with controlled ventricular response.    IMPRESSION:  1. Paroxysmal atrial flutter (CMS HCC)    2. Mixed hyperlipidemia    3. Essential hypertension    4. History of cardioversion    5. Left bundle branch hemiblock    6. History of cardiac cath-Overall normal coronary anatomy 12/26/2018          PLAN: Patient does appear to be stable from a cardiac standpoint.  He is in atrial flutter today and feels well.  His blood pressure machine tells him when his heart rate is irregular and he tells me for the most part it is regular.  I do feel he is likely going in and out of atrial flutter.  His blood pressure has been slightly elevated and I have ordered to increase his diltiazem.  He is agreeable to this.  No further changes have been made to his current medication regimen.  He has been educated on signs and symptoms of bleeding with use of Eliquis.  He will continue to monitor his blood pressure at home and let us know if it remains elevated.  I will have him return to the office in 6 months or sooner if needed.  He will phone our office with any questions or concerns in the meantime.  Dr. Ginger Organ was readily available for discussion regarding this patient's care.  Orders Placed This Encounter   . ECG (IN CLINIC)   . dilTIAZem (CARDIZEM CD) 240 mg Oral Capsule, Sust. Release 24 hr      Return in about 6 months (around 02/08/2020).   Lucrezia Starch, APRN,FNP-BC

## 2019-08-14 ENCOUNTER — Ambulatory Visit: Payer: Medicare HMO | Admitting: Family Medicine

## 2019-08-18 ENCOUNTER — Other Ambulatory Visit: Payer: Self-pay

## 2019-08-18 ENCOUNTER — Ambulatory Visit: Payer: Medicare HMO | Admitting: Family Medicine

## 2019-08-18 ENCOUNTER — Encounter: Payer: Self-pay | Admitting: Family Medicine

## 2019-08-18 VITALS — BP 118/72 | HR 82 | Ht 67.0 in | Wt 287.0 lb

## 2019-08-18 DIAGNOSIS — M1712 Unilateral primary osteoarthritis, left knee: Secondary | ICD-10-CM

## 2019-08-18 DIAGNOSIS — E669 Obesity, unspecified: Secondary | ICD-10-CM

## 2019-08-18 DIAGNOSIS — R69 Illness, unspecified: Secondary | ICD-10-CM | POA: Diagnosis not present

## 2019-08-18 NOTE — Patient Instructions (Signed)
[facility] will call you See me again in 8 weeks

## 2019-08-18 NOTE — Assessment & Plan Note (Signed)
Patient has failed all conservative therapy.  Does need a replacement but is high risk secondary to patient's comorbidities and weight.  Hopefully patient will be able to lose some weight first and then have the replacement.  Patient does seem somewhat motivated.  Discussed with him only every 8 weeks we can do steroid injections to help with the pain.  Patient is in agreement with the plan.

## 2019-08-18 NOTE — Assessment & Plan Note (Signed)
Referral to [facility] 

## 2019-08-18 NOTE — Progress Notes (Signed)
Tanner Tanner [name] Phone: [phone] Subjective:   Tanner Tanner [name], am serving as a scribe for Dr. Hulan [name]. This visit occurred during the SARS-CoV-2 public health emergency.  Safety protocols were in place, including screening questions prior to the visit, additional usage of staff PPE, and extensive cleaning of exam room while observing appropriate contact time as indicated for disinfecting solutions.    I'm seeing this patient by the request  of:  Tanner Koch, MD  CC: Knee pain follow-up  [id number]   06/12/2019 Viscosupplementation did not seem to make significant improvement.  Steroid injection given again today.  Discussed with patient that otherwise surgical intervention is necessary.  Patient is Tanner [name] longer being the primary caregiver for his wife and is able to focus more on himself.  Patient will be referred today to orthopedic surgery to discuss potential replacement.  Update 08/18/2019 Tanner Tanner [name] is a 74 y.o. male coming in with complaint of left knee pain. Patient states that spoke with Dr. Mayer Tanner [name] who said patient needs to lose weight before he will do surgery. Did suggest gel injection for patient. Pain constantly on medial aspect.       Past Medical History:  Diagnosis Date  . Acute kidney failure ([place])   . Arthritis   . Back pain   . BPH (benign prostatic hypertrophy)   . Clostridium difficile infection   . Clotting disorder ([place])   . Depression   . Diabetes ([place]) 12/11/2016  . DVT (deep venous thrombosis) ([place])   . DVT of deep femoral vein, right ([place]) 06/29/2018  . Hypertension   . Low back pain potentially associated with spinal stenosis   . Neuromuscular disorder ([place])   . Neuropathy of lower extremity    bilateral  . OSA (obstructive sleep apnea)   . PE (pulmonary embolism)   . Ventral hernia    Past Surgical History:  Procedure Laterality Date  . EYE SURGERY    . HERNIA  REPAIR     Social History   Socioeconomic History  . Marital status: Married    Spouse name: Not on file  . Number of children: 2  . Years of education: Not on file  . Highest education level: Not on file  Occupational History  . Occupation: Retired  Tobacco Use  . Smoking status: Former Smoker    Packs/day: 2.00    Years: 33.00    Pack years: 66.00    Types: Cigarettes    Start date: 08/30/1968    Quit date: 07/02/2001    Years since quitting: 18.1  . Smokeless tobacco: Never Used  . Tobacco comment: quit 12 years ago  Substance and Sexual Activity  . Alcohol use: Yes    Comment: occasional  . Drug use: Tanner [name]  . Sexual activity: Not Currently  Other Topics Concern  . 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    Social Determinants of Health   Financial Resource Strain:   . Difficulty of Paying Living Expenses: Not on file  Food Insecurity:   . Worried About Charity fundraiser in the Last Year: Not on file  . Ran Out of Food in the Last Year: Not on file  Transportation Needs:   .  Lack of Transportation (Medical): Not on file  . Lack of Transportation (Non-Medical): Not on file  Physical Activity:   . Days of Exercise per Week: Not on file  . Minutes of Exercise per Session: Not on file  Stress:   . Feeling of Stress : Not on file  Social Connections:   . Frequency of Communication with Friends and Family: Not on file  . Frequency of Social Gatherings with Friends and Family: Not on file  . Attends Religious Services: Not on file  . Active Member of Clubs or Organizations: Not on file  . Attends Archivist Meetings: Not on file  . Marital Status: Not on file   Allergies  Allergen Reactions  . Codeine Sulfate Itching and Nausea Only   Family History  Problem Relation Age of Onset  . Diabetes  Mother   . Pneumonia Mother   . Alzheimer's disease Mother   . Other Father 32       Drowned on boating accident  . Colon cancer Neg Hx   . Colon polyps Neg Hx      Current Outpatient Medications (Cardiovascular):  .  atenolol (TENORMIN) 25 MG tablet, Take 12.5 mg by mouth daily. .  hydrochlorothiazide (HYDRODIURIL) 25 MG tablet, Take 25 mg by mouth daily.   Current Outpatient Medications (Analgesics):  .  acetaminophen (TYLENOL) 500 MG tablet, Take 1,000 mg by mouth as needed. Marland Kitchen  allopurinol (ZYLOPRIM) 100 MG tablet, Take 2 tablets (200 mg total) by mouth daily. .  traMADol (ULTRAM) 50 MG tablet, Take 100 mg by mouth 3 (three) times daily as needed.   Current Outpatient Medications (Hematological):  .  apixaban (ELIQUIS) 5 MG TABS tablet, Take 1 tablet (5 mg total) by mouth 2 (two) times daily.  Current Outpatient Medications (Other):  Marland Kitchen  buPROPion (WELLBUTRIN SR) 150 MG 12 hr tablet, Take 150 mg by mouth 2 (two) times daily. .  finasteride (PROSCAR) 5 MG tablet, Take 5 mg by mouth at bedtime. .  gabapentin (NEURONTIN) 300 MG capsule, 600 mg. Take 2 tablets three daily AND 300 MG AT BEDTIME .  ketoconazole (NIZORAL) 2 % shampoo, Apply topically daily. .  Menthol-Methyl Salicylate (THERA-GESIC PLUS) CREA, Apply 1 application topically at bedtime. Apply to lower back .  Multiple Vitamins-Minerals (MULTIVITAMINS THER. W/MINERALS) TABS, Take 1 tablet by mouth daily. .  nortriptyline (PAMELOR) 10 MG capsule, 2 CAPSULES EVERY NIGHT AT BEDTIME .  saccharomyces boulardii (FLORASTOR) 250 MG capsule, Take 1 capsule (250 mg total) by mouth 2 (two) times daily. Marland Kitchen  zolpidem (AMBIEN) 10 MG tablet, Take 1 tablet (10 mg total) by mouth at bedtime. For sleep   Reviewed prior external information including notes and imaging from  primary care provider As well as notes that were available from care everywhere and other healthcare systems.  Past medical history, social, surgical and family  history all reviewed in electronic medical record.  Tanner [name] pertanent information unless stated regarding to the chief complaint.   Review of Systems:  Tanner [name] headache, visual changes, nausea, vomiting, diarrhea, constipation, dizziness, abdominal pain, skin rash, fevers, chills, night sweats, weight loss, swollen lymph nodes, body aches, joint swelling, chest pain, shortness of breath, mood changes. POSITIVE muscle aches  Objective  Blood pressure 118/72, pulse 82, height 5\' 7"  (1.702 m), weight 287 lb (130.2 kg), SpO2 95 %.   General: Tanner [name] apparent distress alert and oriented x3 mood and affect normal, dressed appropriately.  Morbidly obese HEENT: Pupils equal, extraocular movements intact  Respiratory: Patient's speak in full sentences and does not appear short of breath  Cardiovascular: 1+ lower extremity edema, tender, Tanner [name] erythema  Skin: Warm dry intact with Tanner [name] signs of infection or rash on extremities or on axial skeleton.  Abdomen: Soft nontender  Neuro: Cranial nerves II through XII are intact, neurovascularly intact in all extremities with 2+ DTRs and 2+ pulses.  Lymph: Tanner [name] lymphadenopathy of posterior or anterior cervical chain or axillae bilaterally.  Gait antalgic using a cane MSK: Knee: Left valgus deformity noted. Large thigh to calf ratio.  Tender to palpation over medial and PF joint line.  ROM full in flexion and extension and lower leg rotation. instability with valgus force.  painful patellar compression. Patellar glide with moderate crepitus. Patellar and quadriceps tendons unremarkable. Hamstring and quadriceps strength is normal.   After informed written and verbal consent, patient was seated on exam table. Left knee was prepped with alcohol swab and utilizing anterolateral approach, patient's left knee space was injected with 4:1  marcaine 0.5%: Kenalog 40mg /dL. Patient tolerated the procedure well without immediate complications.    Impression and Recommendations:      This case required medical decision making of moderate complexity. The above documentation has been reviewed and is accurate and complete Lyndal Pulley, DO       Note: This dictation was prepared with Dragon dictation along with smaller phrase technology. Any transcriptional errors that result from this process are unintentional.

## 2019-08-29 ENCOUNTER — Ambulatory Visit: Payer: Medicare HMO | Attending: Internal Medicine

## 2019-08-29 DIAGNOSIS — Z23 Encounter for immunization: Secondary | ICD-10-CM

## 2019-08-29 NOTE — Progress Notes (Signed)
   Covid-19 Vaccination Clinic  Name:  Tanner [name]    MRN: [id number] DOB: 1946-01-23  08/29/2019  [name] was observed post Covid-19 immunization for 15 minutes without incident. He was provided with Vaccine Information Sheet and instruction to access the V-Safe system.   [name] was instructed to call 911 with any severe reactions post vaccine: Marland Kitchen Difficulty breathing  . Swelling of face and throat  . A fast heartbeat  . A bad rash all over body  . Dizziness and weakness   Immunizations Administered    Name Date Dose VIS Date Route   Moderna COVID-19 Vaccine 08/29/2019 11:41 AM 0.5 mL 05/16/2019 Intramuscular   Manufacturer: Moderna   Lot: [id number]   [id number]

## 2019-09-28 LAB — CBC/DIFF
HCT: 44.4
HGB: 14.8
PLATELET COUNT: 186
RBC: 4.74
WBC: 7.5

## 2019-09-28 LAB — BASIC METABOLIC PANEL, FASTING
BUN: 14
CREATININE: 1
GLUCOSE, FASTING: 101
POTASSIUM: 4.2
SODIUM: 143

## 2019-09-28 LAB — LIPID PANEL
CHOLESTEROL: 153
HDL-CHOLESTEROL: 54
LDL (CALCULATED): 86
TRIGLYCERIDES: 64

## 2019-09-28 LAB — ALT (SGPT): ALT (SGPT): 13

## 2019-09-28 LAB — AST (SGOT): AST (SGOT): 14

## 2019-09-29 ENCOUNTER — Ambulatory Visit (INDEPENDENT_AMBULATORY_CARE_PROVIDER_SITE_OTHER): Payer: No Typology Code available for payment source | Admitting: Family Medicine

## 2019-09-29 ENCOUNTER — Encounter (INDEPENDENT_AMBULATORY_CARE_PROVIDER_SITE_OTHER): Payer: Self-pay | Admitting: Family Medicine

## 2019-09-29 ENCOUNTER — Other Ambulatory Visit: Payer: Self-pay

## 2019-09-29 VITALS — BP 142/96 | HR 79 | Ht 70.5 in | Wt 259.5 lb

## 2019-09-29 DIAGNOSIS — Z87891 Personal history of nicotine dependence: Secondary | ICD-10-CM

## 2019-09-29 DIAGNOSIS — E785 Hyperlipidemia, unspecified: Secondary | ICD-10-CM

## 2019-09-29 DIAGNOSIS — H6191 Disorder of right external ear, unspecified: Secondary | ICD-10-CM

## 2019-09-29 DIAGNOSIS — J986 Disorders of diaphragm: Secondary | ICD-10-CM

## 2019-09-29 DIAGNOSIS — I10 Essential (primary) hypertension: Secondary | ICD-10-CM

## 2019-09-29 DIAGNOSIS — J449 Chronic obstructive pulmonary disease, unspecified: Secondary | ICD-10-CM

## 2019-09-29 DIAGNOSIS — I4892 Unspecified atrial flutter: Secondary | ICD-10-CM

## 2019-09-29 DIAGNOSIS — H6190 Disorder of external ear, unspecified, unspecified ear: Secondary | ICD-10-CM

## 2019-09-29 MED ORDER — LISINOPRIL 40 MG TABLET
40.0000 mg | ORAL_TABLET | Freq: Every day | ORAL | 3 refills | Status: DC
Start: 2019-09-29 — End: 2020-10-01

## 2019-09-29 MED ORDER — FUROSEMIDE 40 MG TABLET
40.0000 mg | ORAL_TABLET | Freq: Every day | ORAL | 3 refills | Status: DC
Start: 2019-09-29 — End: 2020-09-13

## 2019-09-29 NOTE — Nursing Note (Signed)
Pt here for F.U , C/O having numbness and p-ain in legs, small rash on back of ear

## 2019-09-29 NOTE — Progress Notes (Signed)
Thomas [name] is a 74 y.o. male who presents today for a six-month follow-up of hypertension and hyperlipidemia and chronic medical problems.  The patient reports current adherence to recommended diet is good. The pt is compliant with medicine.  Regarding HTN: The following symptoms are also present: none, and he also denies the following symptoms: headache, blurred vision, chest discomfort, shortness of breath and palpitations.  Side effects of medication:none.    Regarding lipid treatment: He is taking Zocor and is having no side effects. Last labs were excellent that were just drawn yesterday.  Total cholesterol is 153, triglycerides are 64, HDL 54 and LDL only 86. Fasting blood sugar is only 100 and kidney and liver function are normal.  He does have a history of atrial flutter and has follow-up with cardiology.  He did see his cardiologist in [place] in February.  He had had a cardioversion in July that was successful initially but then he did  go back into atrial fibrillation.  They stopped his flecainide because they felt that it was not working. He  takes diltiazem for his rate control his recent appointment with cardiology increased his dose to 240 mg. He continues on his Eliquis as his anticoagulant and it seems to be work.  He denies having any bleeding.  He denies any palpitations, lightheadedness, dizziness or chest pain.    He does admit to take the Lasix and potassium on a daily basis.  This does help control his swelling.  The patient does have a history of COPD.  He was refill immunology because of an elevated hemidiaphragm and shortness of breath.  He was diagnosed by PFTs with moderate COPD.  He also had a CT scan of the chest showing some granulomas in a debated left hemidiaphragm.  Because of the elevated left hemidiaphragm id testing to evaluate his phrenic nerve.  He was a maintenance inhaler.  He did have some side effects to Stiolto so they did switch him to [facility] which he is tolerating  better.  Any pulmonary symptoms at this time.  He does have a lesion on outside of the right ear.  He stated does seem to bleed at nighttime he is lying down he noticed his pillow sometimes.  He thought maybe it was impetigo.  He has not been picking at it.  It is not painful or itchy  He does state that there is some pain in his left lower leg sometimes when he sits in his recliner but if he sits forward the pain goes away.  It only when he is sitting in a certain position.  He is not having any calf pain with walking.  He is not having of the calf.  He has no significant back pain and he is not having any pain radiating down his buttock and the back of his leg.    REVIEW OF SYSTEMS:  GENERAL: negative for fevers/chills, fatigue, significant weight change, sleep disturbance  HEENT:denies visual changes, sore throat or URI symptoms  RESPIRATORY:shortness of breath as abpve, no cough, or wheeze  CARDIAC:  Chest pains and shortness of breath as above  GI: no nausea/vomitting/diarrhea, no abdominal pain  MUSCULOSKELETAL: no myalgias or arthragias, no recent trauma, no edema , left leg pain as above in history of present illness  NEUROLOGIC: no headache, visual changes or mental status changes  GU: no urinary symptoms or CVA tenderness      No Known Allergies  Current Outpatient Medications   Medication Sig  acetaminophen (TYLENOL) 500 mg Oral Tablet Take 500 mg by mouth Every 4 hours as needed for Pain (pt takes tylenol pm)    ANORO ELLIPTA 62.5-25 mcg/actuation Inhalation Disk with Device INHALE ONE PUFF BY MOUTH DAILY    apixaban (ELIQUIS) 5 mg Oral Tablet Take 5 mg by mouth    dilTIAZem (CARDIZEM CD) 240 mg Oral Capsule, Sust. Release 24 hr Take 1 Capsule (240 mg total) by mouth Once a day    furosemide (LASIX) 40 mg Oral Tablet Take 1 Tablet (40 mg total) by mouth Once a day    lisinopriL (PRINIVIL) 40 mg Oral Tablet Take 1 Tablet (40 mg total) by mouth Once a day    melatonin 5 mg Oral Tablet Take 5 mg by  mouth Every night    nitroGLYCERIN (NITROSTAT) 0.4 mg Sublingual Tablet, Sublingual 1 Tab (0.4 mg total) by Sublingual route Every 5 minutes as needed for Chest pain    potassium chloride (KLOR-CON) 10 mEq Oral Tablet Sustained Release Take 1 Tab (10 mEq total) by mouth Once a day    simvastatin (ZOCOR) 20 mg Oral Tablet Take 1 Tab (20 mg total) by mouth Every other day    VITAMIN E ORAL Take 800 mg by mouth     Past Medical History:   Diagnosis Date    Arthritis     Dysuria     Enteritis due to Rotavirus     Gout     Headache     Hemorrhoid     Hypercholesterolemia     Hypertension          Past Surgical History:   Procedure Laterality Date    HX HEMORRHOIDECTOMY           Social History     Tobacco Use    Smoking status: Former Smoker     Years: 60.00     Types: Cigarettes, Cigars    Smokeless tobacco: Current User     Types: Chew, Snuff   Substance Use Topics    Alcohol use: Yes        PHYSICAL EXAM:   The patient appears to be in no acute distress.  Vitals: BP (!) 142/96    Pulse 79    Ht 1.791 m (5' 10.5")    Wt 118 kg (259 lb 8 oz)    SpO2 96%    BMI 36.71 kg/m   Respiratory: clear to ascultation B/L, no respiratory distress, equal BS throughout, no bibasilar rales  Heart: normal sinus rhythm today, no murmur, no edema, normal peripheral pulses  Abdomen: soft, nontender, positive bowel sounds  Extremities: no edema, no calf pain or tenderness  Neuro: AAOx3, cranial nerves's intact, DTR's 2/4 throughout  Skin: warm and dry, no rashes or lesions    ASSESSMENT:     ICD-10-CM    1. Paroxysmal atrial flutter (CMS HCC)  I48.92 His heart rate is very well controlled.  He brought his list of readings from home and rarely does he even go over 90. Continue calcium channel blocker and continue anticoagulation with Eliquis.  Follow-up with cardiologist.   2. Lesion of external ear, unspecified laterality  H61.90 OUTSIDE CONSULT/REFERRAL PROVIDER(AMB)-refer to dermatology   3. Essential hypertension  I10  His blood pressure cold.  He did bring in all of his home readings and rarely does he have a blood pressure greater than 140. Continue current regimen   4. Hyperlipidemia  E78.5 Lipids are excellent   5. Elevated hemidiaphragm  J98.6 Follow-up  with pulmonology   6. COPD (chronic obstructive pulmonary disease) (CMS HCC)  J44.9 Continue maintenance inhaler and follow-up with his pulmonologist.  He denies any complaints today from a pulmonary standpoint.     PLAN:   I reviewed appropriate screenings and HCM with patient.  Immunizations are up-to-date.  He was able to get his COVID-19 vaccine.  Orders Placed This Encounter    OUTSIDE CONSULT/REFERRAL PROVIDER(AMB)    lisinopriL (PRINIVIL) 40 mg Oral Tablet    furosemide (LASIX) 40 mg Oral Tablet     Medication: continue current medication regimen unchanged  Return in about 6 months (around 03/30/2020).

## 2019-10-02 ENCOUNTER — Other Ambulatory Visit (INDEPENDENT_AMBULATORY_CARE_PROVIDER_SITE_OTHER): Payer: Self-pay

## 2019-10-02 DIAGNOSIS — R9439 Abnormal result of other cardiovascular function study: Secondary | ICD-10-CM

## 2019-10-02 DIAGNOSIS — E785 Hyperlipidemia, unspecified: Secondary | ICD-10-CM

## 2019-10-02 DIAGNOSIS — I10 Essential (primary) hypertension: Secondary | ICD-10-CM

## 2019-10-02 DIAGNOSIS — R0789 Other chest pain: Secondary | ICD-10-CM

## 2019-10-10 ENCOUNTER — Other Ambulatory Visit: Payer: Self-pay

## 2019-10-10 ENCOUNTER — Ambulatory Visit: Payer: Medicare HMO | Admitting: Family Medicine

## 2019-10-10 ENCOUNTER — Encounter: Payer: Self-pay | Admitting: Family Medicine

## 2019-10-10 DIAGNOSIS — M1712 Unilateral primary osteoarthritis, left knee: Secondary | ICD-10-CM | POA: Diagnosis not present

## 2019-10-10 NOTE — Assessment & Plan Note (Signed)
Severe overall.  Has failed all conservative therapy.  Unable to get a knee replacement secondary to comorbidities, morbid obesity, blood thinner to name a few.  Patient will be given of steroid injection today.  Can repeat every 8 weeks.  Follow-up sooner and could continue with a potential Toradol injection.  Patient is agreement with the plan.

## 2019-10-10 NOTE — Patient Instructions (Signed)
Good to see you Give it 8 weeks between injection If worse call me we will fit you in to do a Toradol injection in the knee

## 2019-10-10 NOTE — Progress Notes (Signed)
[facility] [address] [place] Phone: [phone] Subjective:   I [name] am serving as a Education administrator for Dr. Hulan [name].  This visit occurred during the SARS-CoV-2 public health emergency.  Safety protocols were in place, including screening questions prior to the visit, additional usage of staff PPE, and extensive cleaning of exam room while observing appropriate contact time as indicated for disinfecting solutions.   I'm seeing this patient by the request  of:  Hoyt Koch, MD  CC: Left knee pain  [id number]   08/18/2019 Patient has failed all conservative therapy.  Does need a replacement but is high risk secondary to patient's comorbidities and weight.  Hopefully patient will be able to lose some weight first and then have the replacement.  Patient does seem somewhat motivated.  Discussed with him only every 8 weeks we can do steroid injections to help with the pain.  Patient is in agreement with the plan.  Update 10/10/2019 Tanner [name] is a 74 y.o. male coming in with complaint of left knee pain. Patient states he is feeling worse today.  Patient is having increasing instability and feels like the swelling is worsening at this time.  Affecting daily activities.  Now using a walking stick at all times.       Past Medical History:  Diagnosis Date  . Acute kidney failure ([place])   . Arthritis   . Back pain   . BPH (benign prostatic hypertrophy)   . Clostridium difficile infection   . Clotting disorder ([place])   . Depression   . Diabetes ([place]) 12/11/2016  . DVT (deep venous thrombosis) ([place])   . DVT of deep femoral vein, right ([place]) 06/29/2018  . Hypertension   . Low back pain potentially associated with spinal stenosis   . Neuromuscular disorder ([place])   . Neuropathy of lower extremity    bilateral  . OSA (obstructive sleep apnea)   . PE (pulmonary embolism)   . Ventral hernia    Past Surgical History:    Procedure Laterality Date  . EYE SURGERY    . HERNIA REPAIR     Social History   Socioeconomic History  . Marital status: Married    Spouse name: Not on file  . Number of children: 2  . Years of education: Not on file  . Highest education level: Not on file  Occupational History  . Occupation: Retired  Tobacco Use  . Smoking status: Former Smoker    Packs/day: 2.00    Years: 33.00    Pack years: 66.00    Types: Cigarettes    Start date: 08/30/1968    Quit date: 07/02/2001    Years since quitting: 18.2  . Smokeless tobacco: Never Used  . Tobacco comment: quit 12 years ago  Substance and Sexual Activity  . Alcohol use: Yes    Comment: occasional  . Drug use: No  . Sexual activity: Not Currently  Other Topics Concern  . 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    Social Determinants of Health   Financial Resource Strain:   . Difficulty of Paying Living Expenses:   Food Insecurity:   . Worried About Charity fundraiser in the Last Year:   .  Ran Out of Food in the Last Year:   Transportation Needs:   . Film/video editor (Medical):   Marland Kitchen Lack of Transportation (Non-Medical):   Physical Activity:   . Days of Exercise per Week:   . Minutes of Exercise per Session:   Stress:   . Feeling of Stress :   Social Connections:   . Frequency of Communication with Friends and Family:   . Frequency of Social Gatherings with Friends and Family:   . Attends Religious Services:   . Active Member of Clubs or Organizations:   . Attends Archivist Meetings:   Marland Kitchen Marital Status:    Allergies  Allergen Reactions  . Codeine Sulfate Itching and Nausea Only   Family History  Problem Relation Age of Onset  . Diabetes Mother   . Pneumonia Mother   . Alzheimer's disease Mother   . Other Father 44       Drowned on  boating accident  . Colon cancer Neg Hx   . Colon polyps Neg Hx      Current Outpatient Medications (Cardiovascular):  .  atenolol (TENORMIN) 25 MG tablet, Take 12.5 mg by mouth daily. .  hydrochlorothiazide (HYDRODIURIL) 25 MG tablet, Take 25 mg by mouth daily.   Current Outpatient Medications (Analgesics):  .  acetaminophen (TYLENOL) 500 MG tablet, Take 1,000 mg by mouth as needed. Marland Kitchen  allopurinol (ZYLOPRIM) 100 MG tablet, Take 2 tablets (200 mg total) by mouth daily. .  traMADol (ULTRAM) 50 MG tablet, Take 100 mg by mouth 3 (three) times daily as needed.   Current Outpatient Medications (Hematological):  .  apixaban (ELIQUIS) 5 MG TABS tablet, Take 1 tablet (5 mg total) by mouth 2 (two) times daily.  Current Outpatient Medications (Other):  Marland Kitchen  buPROPion (WELLBUTRIN SR) 150 MG 12 hr tablet, Take 150 mg by mouth 2 (two) times daily. .  finasteride (PROSCAR) 5 MG tablet, Take 5 mg by mouth at bedtime. .  gabapentin (NEURONTIN) 300 MG capsule, 600 mg. Take 2 tablets three daily AND 300 MG AT BEDTIME .  ketoconazole (NIZORAL) 2 % shampoo, Apply topically daily. .  Menthol-Methyl Salicylate (THERA-GESIC PLUS) CREA, Apply 1 application topically at bedtime. Apply to lower back .  Multiple Vitamins-Minerals (MULTIVITAMINS THER. W/MINERALS) TABS, Take 1 tablet by mouth daily. .  nortriptyline (PAMELOR) 10 MG capsule, 2 CAPSULES EVERY NIGHT AT BEDTIME .  saccharomyces boulardii (FLORASTOR) 250 MG capsule, Take 1 capsule (250 mg total) by mouth 2 (two) times daily. Marland Kitchen  zolpidem (AMBIEN) 10 MG tablet, Take 1 tablet (10 mg total) by mouth at bedtime. For sleep   Reviewed prior external information including notes and imaging from  primary care provider As well as notes that were available from care everywhere and other healthcare systems.  Past medical history, social, surgical and family history all reviewed in electronic medical record.  No pertanent information unless stated regarding to  the chief complaint.   Review of Systems:  No headache, visual changes, nausea, vomiting, diarrhea, constipation, dizziness, abdominal pain, skin rash, fevers, chills, night sweats, weight loss, swollen lymph nodes, , chest pain, shortness of breath, mood changes. POSITIVE muscle aches, body aches, joint swelling  Objective  Blood pressure 124/60, pulse 72, height 5\' 7"  (1.702 m), weight 286 lb (129.7 kg), SpO2 90 %.   General: No apparent distress alert and oriented x3 mood and affect normal, dressed appropriately.  Asymmetry of the face still noted  Respiratory: Patient's speak in full sentences and does  not appear short of breath  Cardiovascular: 2+ lower extremity edema, non tender, no erythema  Neuro: Cranial nerves II through XII are intact, neurovascularly intact in all extremities with 2+ DTRs and 2+ pulses.  Gait antalgic MSK:   Knee: Left valgus deformity noted.  Abnormal thigh to calf ratio.  Tender to palpation over medial and PF joint line.  ROM full in flexion and extension and lower leg rotation. instability with valgus force.  painful patellar compression. Patellar glide with moderate crepitus. Patellar and quadriceps tendons unremarkable.  After informed written and verbal consent, patient was seated on exam table. Left knee was prepped with alcohol swab and utilizing anterolateral approach, patient's left knee space was injected with 4:1  marcaine 0.5%: Kenalog 40mg /dL. Patient tolerated the procedure well without immediate complications.    Impression and Recommendations:     This case required medical decision making of moderate complexity. The above documentation has been reviewed and is accurate and complete Lyndal Pulley, DO       Note: This dictation was prepared with Dragon dictation along with smaller phrase technology. Any transcriptional errors that result from this process are unintentional.

## 2019-10-11 DIAGNOSIS — R69 Illness, unspecified: Secondary | ICD-10-CM | POA: Diagnosis not present

## 2019-10-12 ENCOUNTER — Encounter (INDEPENDENT_AMBULATORY_CARE_PROVIDER_SITE_OTHER): Payer: Self-pay | Admitting: Family Medicine

## 2019-10-12 ENCOUNTER — Other Ambulatory Visit: Payer: Self-pay

## 2019-10-12 ENCOUNTER — Ambulatory Visit (INDEPENDENT_AMBULATORY_CARE_PROVIDER_SITE_OTHER): Payer: Medicare HMO | Admitting: Family Medicine

## 2019-10-12 VITALS — BP 118/70 | HR 68 | Temp 98.0°F | Ht 65.0 in | Wt 283.0 lb

## 2019-10-12 DIAGNOSIS — E1169 Type 2 diabetes mellitus with other specified complication: Secondary | ICD-10-CM | POA: Diagnosis not present

## 2019-10-12 DIAGNOSIS — R5383 Other fatigue: Secondary | ICD-10-CM | POA: Diagnosis not present

## 2019-10-12 DIAGNOSIS — I152 Hypertension secondary to endocrine disorders: Secondary | ICD-10-CM

## 2019-10-12 DIAGNOSIS — G4733 Obstructive sleep apnea (adult) (pediatric): Secondary | ICD-10-CM | POA: Diagnosis not present

## 2019-10-12 DIAGNOSIS — E119 Type 2 diabetes mellitus without complications: Secondary | ICD-10-CM

## 2019-10-12 DIAGNOSIS — E785 Hyperlipidemia, unspecified: Secondary | ICD-10-CM

## 2019-10-12 DIAGNOSIS — Z1331 Encounter for screening for depression: Secondary | ICD-10-CM | POA: Diagnosis not present

## 2019-10-12 DIAGNOSIS — Z6841 Body Mass Index (BMI) 40.0 and over, adult: Secondary | ICD-10-CM

## 2019-10-12 DIAGNOSIS — I10 Essential (primary) hypertension: Secondary | ICD-10-CM | POA: Diagnosis not present

## 2019-10-12 DIAGNOSIS — E1159 Type 2 diabetes mellitus with other circulatory complications: Secondary | ICD-10-CM

## 2019-10-12 DIAGNOSIS — R0602 Shortness of breath: Secondary | ICD-10-CM

## 2019-10-12 DIAGNOSIS — Z0289 Encounter for other administrative examinations: Secondary | ICD-10-CM

## 2019-10-12 NOTE — Progress Notes (Signed)
Dear Dr. Tamala Julian,   Thank you for referring Tanner [name] to our clinic. The following note includes my evaluation and treatment recommendations.  Chief Complaint:   OBESITY Tanner [name] (MR# [id number]) is a 74 y.o. male who presents for evaluation and treatment of obesity and related comorbidities. Current BMI is Body mass index is 47.09 kg/m. Tanner [name] has been struggling with his weight for many years and has been unsuccessful in either losing weight, maintaining weight loss, or reaching his healthy weight goal.  Tanner [name] is currently in the action stage of change and ready to dedicate time achieving and maintaining a healthier weight. Tanner [name] is interested in becoming our patient and working on intensive lifestyle modifications including (but not limited to) diet and exercise for weight loss.  Story says "I'm a cookie eater".  He is in need of a left knee replacement.  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  Will eat crackers or cookies or a Pop Tart to take medications. Lunch:  Sandwich:  Ham/turkey. Dinner:  Eat out.  "Hamburger jointt."  Tanner [name] is a favorite. He likes Hungry Man microwave meals as well.  Tanner [name]'s habits were reviewed today and are as follows: his desired weight loss is 86 pounds, he has been heavy most of his adult life, he started gaining weight over the last several years, his heaviest weight ever was 321 pounds, he craves pasta, he snacks frequently in the evenings, he skips breakfast 2-3 times a week, he is frequently drinking liquids with calories, he frequently makes poor food choices and he struggles with emotional eating.  Depression Screen Tanner [name]'s Food and Mood (modified PHQ-9) score was 12.  Depression screen Tanner [name] 2/9 10/12/2019  Decreased Interest 3  Down, Depressed, Hopeless 3  PHQ - 2 Score 6  Altered  sleeping 0  Tired, decreased energy 3  Change in appetite 1  Feeling bad or failure about yourself  1  Trouble concentrating 1  Moving slowly or fidgety/restless 0  Suicidal thoughts 0  PHQ-9 Score 12  Difficult doing work/chores Not difficult at all   Subjective:   1. Other fatigue Tanner [name] admits to daytime somnolence and reports waking up still tired. Patent has a history of symptoms of daytime fatigue, morning fatigue and snoring. Tanner [name] generally gets 6 or 7 hours of sleep per night, and states that he has poor quality sleep. Snoring is present. Apneic episodes are not present. Epworth Sleepiness Score is 12.  2. SOB (shortness of breath) on exertion Tanner [name] notes increasing shortness of breath with exercising and seems to be worsening over time with weight gain. He notes getting out of breath sooner with activity than he used to. This has gotten worse recently. Tanner [name] denies shortness of breath at rest or orthopnea.  3. OSA (obstructive sleep apnea) Tanner [name] has a diagnosis of sleep apnea. He reports that he is not using a CPAP regularly.   4. Hypertension associated with diabetes ([place]) Review: taking medications as instructed, no medication side effects noted, no chest pain on exertion, no dyspnea on exertion, no swelling of ankles. He is taking atenolol and HCTZ for blood pressure.  BP Readings from Last 3 Encounters:  10/12/19 118/70  10/10/19 124/60  08/18/19 118/72   5. Hyperlipidemia associated with type 2 diabetes mellitus ([place]) Tanner [name] has hyperlipidemia and has been trying to improve his cholesterol levels with  intensive lifestyle modification including a low saturated fat diet, exercise and weight loss. He denies any chest pain, claudication or myalgias.  He is taking atorvastatin.  Lab Results  Component Value Date   ALT 18 07/05/2019   AST 15 07/05/2019   ALKPHOS 98 07/05/2019   BILITOT 0.8 07/05/2019   Lab Results  Component Value Date   CHOL 92 03/12/2017     HDL 27.40 (L) 03/12/2017   LDLCALC 33 03/12/2017   LDLDIRECT 65.9 09/13/2007   TRIG 161.0 (H) 03/12/2017   CHOLHDL 3 03/12/2017   6. Type 2 diabetes mellitus without complication, without long-term current use of insulin ([place]) Tanner [name] says the [place] took him off metformin due to kidney issues He is not on any DM medication at this time. I recommend a GLP1RA if covered at the [place] or with his insurance plan.  7. Depression screening Tanner [name] was screened for depression as part of his new patient paperwork.  PHQ-9 is 12 today.  Assessment/Plan:   1. Other fatigue Tanner [name] does feel that his weight is causing his energy to be lower than it should be. Fatigue may be related to obesity, depression or many other causes. Labs will be ordered, and in the meanwhile, Tanner will focus on self care including making healthy food choices, increasing physical activity and focusing on stress reduction.  Orders - EKG 12-Lead - VITAMIN D 25 Hydroxy (Vit-D Deficiency, Fractures) - TSH - T4, free - T3 - Anemia panel  2. SOB (shortness of breath) on exertion Tanner [name] does feel that he gets out of breath more easily that he used to when he exercises. Tanner [name]'s shortness of breath appears to be obesity related and exercise induced. He has agreed to work on weight loss and gradually increase exercise to treat his exercise induced shortness of breath. Will continue to monitor closely.  3. OSA (obstructive sleep apnea) Intensive lifestyle modifications are the first line treatment for this issue. We discussed several lifestyle modifications today and he will continue to work on diet, exercise and weight loss efforts. We will continue to monitor. Orders and follow up as documented in patient record.   Counseling  Sleep apnea is a condition in which breathing pauses or becomes shallow during sleep. This happens over and over during the night. This disrupts your sleep and keeps your body from getting the rest that  it needs, which can cause tiredness and lack of energy (fatigue) during the day.  Sleep apnea treatment: If you were given a device to open your airway while you sleep, USE IT!  Sleep hygiene:   Limit or avoid alcohol, caffeinated beverages, and cigarettes, especially close to bedtime.   Do not eat a large meal or eat spicy foods right before bedtime. This can lead to digestive discomfort that can make it hard for you to sleep.  Keep a sleep diary to help you and your health care provider figure out what could be causing your insomnia.  . Make your bedroom a dark, comfortable place where it is easy to fall asleep. ? Put up shades or blackout curtains to block light from outside. ? Use a white noise machine to block noise. ? Keep the temperature cool. . Limit screen use before bedtime. This includes: ? Watching TV. ? Using your smartphone, tablet, or computer. . Stick to a routine that includes going to bed and waking up at the same times every day and night. This can help you fall asleep faster. Consider making a quiet activity,  such as reading, part of your nighttime routine. . Try to avoid taking naps during the day so that you sleep better at night. . Get out of bed if you are still awake after 15 minutes of trying to sleep. Keep the lights down, but try reading or doing a quiet activity. When you feel sleepy, go back to bed.  4. Hypertension associated with diabetes ([place]) Tanner [name] is working on healthy weight loss and exercise to improve blood pressure control. We will watch for signs of hypotension as he continues his lifestyle modifications.  Orders - CBC with Differential/Platelet  5. Hyperlipidemia associated with type 2 diabetes mellitus ([place]) Cardiovascular risk and specific lipid/LDL goals reviewed.  We discussed several lifestyle modifications today and Tanner [name] will continue to work on diet, exercise and weight loss efforts. Orders and follow up as documented in patient  record.   Counseling Intensive lifestyle modifications are the first line treatment for this issue. . Dietary changes: Increase soluble fiber. Decrease simple carbohydrates. . Exercise changes: Moderate to vigorous-intensity aerobic activity 150 minutes per week if tolerated. . Lipid-lowering medications: see documented in medical record.  Orders - Lipid panel  6. Type 2 diabetes mellitus without complication, without long-term current use of insulin ([facility]) Good blood sugar control is important to decrease the likelihood of diabetic complications such as nephropathy, neuropathy, limb loss, blindness, coronary artery disease, and death. Intensive lifestyle modification including diet, exercise and weight loss are the first line of treatment for diabetes.   Orders - Comprehensive metabolic panel - Hemoglobin A1c - Insulin, random  7. Depression screening Tanner [name] had a positive depression screening. Depression is commonly associated with obesity and often results in emotional eating behaviors. We will monitor this closely and work on CBT to help improve the non-hunger eating patterns. Referral to Psychology may be required if no improvement is seen as he continues in our clinic.  8. Class 3 severe obesity with serious comorbidity and body mass index (BMI) of 45.0 to 49.9 in adult, unspecified obesity type [name]) Tanner [name] is currently in the action stage of change and his goal is to continue with weight loss efforts. I recommend Tanner [name] begin the structured treatment plan as follows:  He has agreed to the Category 1 Plan.  Exercise goals: No exercise has been prescribed at this time.   Behavioral modification strategies: increasing lean protein intake, decreasing simple carbohydrates, increasing vegetables, increasing water intake and decreasing liquid calories.  He was informed of the importance of frequent follow-up visits to maximize his success with intensive lifestyle modifications for  his multiple health conditions. He was informed we would discuss his lab results at his next visit unless there is a critical issue that needs to be addressed sooner. Tanner [name] agreed to keep his next visit at the agreed upon time to discuss these results.  Objective:   Blood pressure 118/70, pulse 68, temperature 98 F (36.7 C), temperature source Oral, height 5\' 5"  (1.651 m), weight 283 lb (128.4 kg), SpO2 92 %. Body mass index is 47.09 kg/m.  EKG: Normal sinus rhythm, rate 70 bpm.  Indirect Calorimeter completed today shows a VO2 of 201 and a REE of 1396.  His calculated basal metabolic rate is [id number] thus his basal metabolic rate is worse than expected.  General: Cooperative, alert, well developed, in no acute distress. HEENT: Conjunctivae and lids unremarkable. Cardiovascular: Regular rhythm.  Lungs: Normal work of breathing. Neurologic: No focal deficits.  He uses a cane with ambulation.  Lab Results  Component  Value Date   CREATININE 1.57 (H) 07/05/2019   BUN 19 07/05/2019   NA 139 07/05/2019   K 3.8 07/05/2019   CL 98 07/05/2019   CO2 34 (H) 07/05/2019   Lab Results  Component Value Date   ALT 18 07/05/2019   AST 15 07/05/2019   ALKPHOS 98 07/05/2019   BILITOT 0.8 07/05/2019   Lab Results  Component Value Date   HGBA1C 7.5 (H) 07/05/2019   HGBA1C 5.9 (H) 01/04/2019   HGBA1C 6.2 09/15/2017   HGBA1C 6.3 03/12/2017   HGBA1C 5.8 11/07/2014   Lab Results  Component Value Date   TSH 2.28 09/13/2007   Lab Results  Component Value Date   CHOL 92 03/12/2017   HDL 27.40 (L) 03/12/2017   LDLCALC 33 03/12/2017   LDLDIRECT 65.9 09/13/2007   TRIG 161.0 (H) 03/12/2017   CHOLHDL 3 03/12/2017   Lab Results  Component Value Date   WBC 9.1 07/05/2019   HGB 15.5 07/05/2019   HCT 46.5 07/05/2019   MCV 87.4 07/05/2019   PLT 192 07/05/2019   Lab Results  Component Value Date   IRON 80 11/29/2017   Attestation Statements:   This is the patient's first visit at [facility]. [name] NEW PATIENT PACKET was reviewed at length. Included in the packet: current and past health history, medications, allergies, ROS, gynecologic history (women only), surgical history, family history, social history, weight history, weight loss surgery history (for those that have had weight loss surgery), nutritional evaluation, mood and food questionnaire, PHQ9, Epworth questionnaire, sleep habits questionnaire, patient life and health improvement goals questionnaire. These will all be scanned into the patient's chart under media.   During the visit, I independently reviewed the patient's EKG, bioimpedance scale results, and indirect calorimeter results. I used this information to tailor a meal plan for the patient that will help him to lose weight and will improve his obesity-related conditions going forward. I performed a medically necessary appropriate examination and/or evaluation. I discussed the assessment and treatment plan with the patient. The patient was provided an opportunity to ask questions and all were answered. The patient agreed with the plan and demonstrated an understanding of the instructions. Labs were ordered at this visit and will be reviewed at the next visit unless more critical results need to be addressed immediately. Clinical information was updated and documented in the EMR.   Time spent on visit including pre-visit chart review and post-visit care was 62 minutes.   I, Water quality scientist, CMA, am acting as Location manager for [employer], DO.  I have reviewed the above documentation for accuracy and completeness, and I agree with the above. Briscoe Deutscher, DO

## 2019-10-13 LAB — COMPREHENSIVE METABOLIC PANEL
ALT: 16 IU/L (ref 0–44)
AST: 24 IU/L (ref 0–40)
Albumin/Globulin Ratio: 2.1 (ref 1.2–2.2)
Albumin: 4.2 g/dL (ref 3.7–4.7)
Alkaline Phosphatase: 117 IU/L (ref 39–117)
BUN/Creatinine Ratio: 15 (ref 10–24)
BUN: 25 mg/dL (ref 8–27)
Bilirubin Total: 0.6 mg/dL (ref 0.0–1.2)
CO2: 26 mmol/L (ref 20–29)
Calcium: 9.5 mg/dL (ref 8.6–10.2)
Chloride: 98 mmol/L (ref 96–106)
Creatinine, Ser: 1.71 mg/dL — ABNORMAL HIGH (ref 0.76–1.27)
GFR calc Af Amer: 45 mL/min/{1.73_m2} — ABNORMAL LOW (ref >59)
GFR calc non Af Amer: 39 mL/min/{1.73_m2} — ABNORMAL LOW (ref >59)
Globulin, Total: 2 g/dL (ref 1.5–4.5)
Glucose: 153 mg/dL — ABNORMAL HIGH (ref 65–99)
Potassium: 4.4 mmol/L (ref 3.5–5.2)
Sodium: 141 mmol/L (ref 134–144)
Total Protein: 6.2 g/dL (ref 6.0–8.5)

## 2019-10-13 LAB — LIPID PANEL
Chol/HDL Ratio: 3.6 ratio (ref 0.0–5.0)
Cholesterol, Total: 109 mg/dL (ref 100–199)
HDL: 30 mg/dL — ABNORMAL LOW (ref >39)
LDL Chol Calc (NIH): 50 mg/dL (ref 0–99)
Triglycerides: 173 mg/dL — ABNORMAL HIGH (ref 0–149)
VLDL Cholesterol Cal: 29 mg/dL (ref 5–40)

## 2019-10-13 LAB — HEMOGLOBIN A1C
Est. average glucose Bld gHb Est-mCnc: 186 mg/dL
Hgb A1c MFr Bld: 8.1 % — ABNORMAL HIGH (ref 4.8–5.6)

## 2019-10-13 LAB — VITAMIN D 25 HYDROXY (VIT D DEFICIENCY, FRACTURES): Vit D, 25-Hydroxy: 32.6 ng/mL (ref 30.0–100.0)

## 2019-10-13 LAB — CBC WITH DIFFERENTIAL/PLATELET
Basophils Absolute: 0.1 10*3/uL (ref 0.0–0.2)
Basos: 1 %
EOS (ABSOLUTE): 0.2 10*3/uL (ref 0.0–0.4)
Eos: 2 %
Hemoglobin: 14.8 g/dL (ref 13.0–17.7)
Immature Grans (Abs): 0.1 10*3/uL (ref 0.0–0.1)
Immature Granulocytes: 1 %
Lymphocytes Absolute: 1.8 10*3/uL (ref 0.7–3.1)
Lymphs: 20 %
MCH: 30.1 pg (ref 26.6–33.0)
MCHC: 33.9 g/dL (ref 31.5–35.7)
MCV: 89 fL (ref 79–97)
Monocytes Absolute: 0.5 10*3/uL (ref 0.1–0.9)
Monocytes: 6 %
Neutrophils Absolute: 6.7 10*3/uL (ref 1.4–7.0)
Neutrophils: 70 %
Platelets: 211 10*3/uL (ref 150–450)
RBC: 4.92 x10E6/uL (ref 4.14–5.80)
RDW: 14.5 % (ref 11.6–15.4)
WBC: 9.4 10*3/uL (ref 3.4–10.8)

## 2019-10-13 LAB — ANEMIA PANEL
Ferritin: 80 ng/mL (ref 30–400)
Folate, Hemolysate: >620 ng/mL
Folate, RBC: >1419 ng/mL (ref >498)
Hematocrit: 43.7 % (ref 37.5–51.0)
Iron Saturation: 29 % (ref 15–55)
Iron: 86 ug/dL (ref 38–169)
Retic Ct Pct: 2.5 % (ref 0.6–2.6)
Total Iron Binding Capacity: 297 ug/dL (ref 250–450)
UIBC: 211 ug/dL (ref 111–343)
Vitamin B-12: 586 pg/mL (ref 232–1245)

## 2019-10-13 LAB — T3: T3, Total: 86 ng/dL (ref 71–180)

## 2019-10-13 LAB — TSH: TSH: 3.05 u[IU]/mL (ref 0.450–4.500)

## 2019-10-13 LAB — INSULIN, RANDOM: INSULIN: 19.4 u[IU]/mL (ref 2.6–24.9)

## 2019-10-13 LAB — T4, FREE: Free T4: 1.35 ng/dL (ref 0.82–1.77)

## 2019-10-20 ENCOUNTER — Other Ambulatory Visit (INDEPENDENT_AMBULATORY_CARE_PROVIDER_SITE_OTHER): Payer: Self-pay | Admitting: Family Medicine

## 2019-10-20 NOTE — Telephone Encounter (Signed)
Last Visit: 09/29/19    Upcoming appointments: 04/01/20          Lynwood Dawley  10/20/2019, 07:36

## 2019-10-24 DIAGNOSIS — M1712 Unilateral primary osteoarthritis, left knee: Secondary | ICD-10-CM | POA: Diagnosis not present

## 2019-10-26 ENCOUNTER — Other Ambulatory Visit: Payer: Self-pay

## 2019-10-26 ENCOUNTER — Encounter (INDEPENDENT_AMBULATORY_CARE_PROVIDER_SITE_OTHER): Payer: Self-pay | Admitting: Family Medicine

## 2019-10-26 ENCOUNTER — Ambulatory Visit (INDEPENDENT_AMBULATORY_CARE_PROVIDER_SITE_OTHER): Payer: Medicare HMO | Admitting: Family Medicine

## 2019-10-26 VITALS — BP 128/72 | HR 63 | Temp 97.6°F | Ht 65.0 in | Wt 273.0 lb

## 2019-10-26 DIAGNOSIS — R69 Illness, unspecified: Secondary | ICD-10-CM | POA: Diagnosis not present

## 2019-10-26 DIAGNOSIS — E785 Hyperlipidemia, unspecified: Secondary | ICD-10-CM | POA: Diagnosis not present

## 2019-10-26 DIAGNOSIS — M25562 Pain in left knee: Secondary | ICD-10-CM

## 2019-10-26 DIAGNOSIS — G8929 Other chronic pain: Secondary | ICD-10-CM

## 2019-10-26 DIAGNOSIS — Z6841 Body Mass Index (BMI) 40.0 and over, adult: Secondary | ICD-10-CM | POA: Diagnosis not present

## 2019-10-26 DIAGNOSIS — E559 Vitamin D deficiency, unspecified: Secondary | ICD-10-CM | POA: Diagnosis not present

## 2019-10-26 DIAGNOSIS — F3289 Other specified depressive episodes: Secondary | ICD-10-CM

## 2019-10-26 DIAGNOSIS — E1165 Type 2 diabetes mellitus with hyperglycemia: Secondary | ICD-10-CM

## 2019-10-26 DIAGNOSIS — N1832 Chronic kidney disease, stage 3b: Secondary | ICD-10-CM

## 2019-10-26 DIAGNOSIS — E1169 Type 2 diabetes mellitus with other specified complication: Secondary | ICD-10-CM

## 2019-10-26 NOTE — Progress Notes (Signed)
Chief Complaint:   OBESITY Shon is here to discuss his progress with his obesity treatment plan along with follow-up of his obesity related diagnoses. Massie is on the Category 1 Plan and states he is following his eating plan approximately 50% of the time. Kveon states he is exercising for 0 minutes 0 times per week.  Today's visit was #: 2 Starting weight: 283 lbs Starting date: 10/12/2019 Today's weight: 273 lbs Today's date: 10/26/2019 Total lbs lost to date: 10 lbs Total lbs lost since last in-office visit: 10 lbs  Interim History: Aybel endorses polyphagia at dinner.    Jacyon provided the following food recall today:  Breakfast:  Skips half the time or 2 eggs and toast. Lunch:  Sandwich with 4 ounces of meat. Dinner:  Frozen meals 4 times a week. Snack:  Apples. Water:  2 bottles per day.  Subjective:   1. Vitamin D deficiency Hardin's Vitamin D level was 32.6 on 10/12/2019. He is currently taking no vitamin D supplement. He denies nausea, vomiting or muscle weakness.  2. Stage 3b chronic kidney disease Nyeem's GFR is 39.  Lab Results  Component Value Date   CREATININE 1.71 (H) 10/12/2019   CREATININE 1.57 (H) 07/05/2019   CREATININE 1.41 (H) 01/04/2019   Lab Results  Component Value Date   CREATININE 1.71 (H) 10/12/2019   BUN 25 10/12/2019   NA 141 10/12/2019   K 4.4 10/12/2019   CL 98 10/12/2019   CO2 26 10/12/2019   3. Type 2 diabetes mellitus with hyperglycemia, without long-term current use of insulin (HCC) Medications reviewed. Diabetic ROS: no polyuria or polydipsia, no chest pain, dyspnea or TIA's, no numbness, tingling or pain in extremities.   Lab Results  Component Value Date   HGBA1C 8.1 (H) 10/12/2019   HGBA1C 7.5 (H) 07/05/2019   HGBA1C 5.9 (H) 01/04/2019   Lab Results  Component Value Date   LDLCALC 50 10/12/2019   CREATININE 1.71 (H) 10/12/2019   Lab Results  Component Value Date   INSULIN 19.4 10/12/2019   4.  Chronic pain of left knee Willliam is working toward a BMI less than 39.5 to qualify for TKR.  5. Hyperlipidemia associated with type 2 diabetes mellitus ([place]) Jerritt has hyperlipidemia and has been trying to improve his cholesterol levels with intensive lifestyle modification including a low saturated fat diet, exercise and weight loss. He denies any chest pain, claudication or myalgias.  He is taking Lipitor.  Lab Results  Component Value Date   ALT 16 10/12/2019   AST 24 10/12/2019   ALKPHOS 117 10/12/2019   BILITOT 0.6 10/12/2019   Lab Results  Component Value Date   CHOL 109 10/12/2019   HDL 30 (L) 10/12/2019   LDLCALC 50 10/12/2019   LDLDIRECT 65.9 09/13/2007   TRIG 173 (H) 10/12/2019   CHOLHDL 3.6 10/12/2019   6. Other depression, with emotional eating Javante is struggling with emotional eating and using food for comfort to the extent that it is negatively impacting his health. He has been working on behavior modification techniques to help reduce his emotional eating and has been unsuccessful. He shows no sign of suicidal or homicidal ideations.  Assessment/Plan:   1. Vitamin D deficiency Isiaha says he will begin taking an OTC vitamin D supplement.  2. Stage 3b chronic kidney disease Lab results and trends reviewed. We discussed several lifestyle modifications today and he will continue to work on diet, exercise and weight loss efforts. Avoid nephrotoxic medications. Orders  and follow up as documented in patient record.   Counseling . Chronic kidney disease (CKD) happens when the kidneys are damaged over a long period of time. . Most of the time, this condition does not go away, but it can usually be controlled. Steps must be taken to slow down the kidney damage or to stop it from getting worse. . Intensive lifestyle modifications are the first line treatment for this issue.  Marland Kitchen Avoid buying foods that are: processed, frozen, or prepackaged to avoid excess salt.  3.  Type 2 diabetes mellitus with hyperglycemia, without long-term current use of insulin (HCC) Tarrin is going to call his PCP at the [place] to see if his insurance will cover a GLP-1.  4. Chronic pain of left knee Will follow because mobility and pain control are important for weight management.  5. Hyperlipidemia associated with type 2 diabetes mellitus ([place]) Cardiovascular risk and specific lipid/LDL goals reviewed.  We discussed several lifestyle modifications today and Kaycen will continue to work on diet, exercise and weight loss efforts. Orders and follow up as documented in patient record.   Counseling Intensive lifestyle modifications are the first line treatment for this issue. . Dietary changes: Increase soluble fiber. Decrease simple carbohydrates. . Exercise changes: Moderate to vigorous-intensity aerobic activity 150 minutes per week if tolerated. . Lipid-lowering medications: see documented in medical record.  6. Other depression, with emotional eating Behavior modification techniques were discussed today to help Lenton deal with his emotional/non-hunger eating behaviors.  Orders and follow up as documented in patient record.   7. Class 3 severe obesity with serious comorbidity and body mass index (BMI) of 45.0 to 49.9 in adult, unspecified obesity type [facility]) Kenson is currently in the action stage of change. As such, his goal is to continue with weight loss efforts. He has agreed to the Category 1 Plan.   Exercise goals: No exercise has been prescribed at this time.  Behavioral modification strategies: increasing lean protein intake and increasing water intake.  Add Fairlife milk at meals.  Increase water to 3 bottles per day.  Darean has agreed to follow-up with our clinic in 2 weeks. He was informed of the importance of frequent follow-up visits to maximize his success with intensive lifestyle modifications for his multiple health conditions.   Objective:   Blood  pressure 128/72, pulse 63, temperature 97.6 F (36.4 C), temperature source Oral, height 5\' 5"  (1.651 m), weight 273 lb (123.8 kg), SpO2 93 %. Body mass index is 45.43 kg/m.  General: Cooperative, alert, well developed, in no acute distress. HEENT: Conjunctivae and lids unremarkable. Cardiovascular: Regular rhythm.  Lungs: Normal work of breathing. Neurologic: No focal deficits.   Lab Results  Component Value Date   CREATININE 1.71 (H) 10/12/2019   BUN 25 10/12/2019   NA 141 10/12/2019   K 4.4 10/12/2019   CL 98 10/12/2019   CO2 26 10/12/2019   Lab Results  Component Value Date   ALT 16 10/12/2019   AST 24 10/12/2019   ALKPHOS 117 10/12/2019   BILITOT 0.6 10/12/2019   Lab Results  Component Value Date   HGBA1C 8.1 (H) 10/12/2019   HGBA1C 7.5 (H) 07/05/2019   HGBA1C 5.9 (H) 01/04/2019   HGBA1C 6.2 09/15/2017   HGBA1C 6.3 03/12/2017   Lab Results  Component Value Date   INSULIN 19.4 10/12/2019   Lab Results  Component Value Date   TSH 3.050 10/12/2019   Lab Results  Component Value Date   CHOL 109 10/12/2019  HDL 30 (L) 10/12/2019   LDLCALC 50 10/12/2019   LDLDIRECT 65.9 09/13/2007   TRIG 173 (H) 10/12/2019   CHOLHDL 3.6 10/12/2019   Lab Results  Component Value Date   WBC 9.4 10/12/2019   HGB 14.8 10/12/2019   HCT 43.7 10/12/2019   MCV 89 10/12/2019   PLT 211 10/12/2019   Lab Results  Component Value Date   IRON 86 10/12/2019   TIBC 297 10/12/2019   FERRITIN 80 10/12/2019   Obesity Behavioral Intervention:   Approximately 15 minutes were spent on the discussion below.  ASK: We discussed the diagnosis of obesity with [name] today and Carnie agreed to give [name] permission to discuss obesity behavioral modification therapy today.  ASSESS: Mussa has the diagnosis of obesity and his BMI today is 45.4. Olav is in the action stage of change.   ADVISE: Farrell was educated on the multiple health risks of obesity as well as the benefit of  weight loss to improve his health. He was advised of the need for long term treatment and the importance of lifestyle modifications to improve his current health and to decrease his risk of future health problems.  AGREE: Multiple dietary modification options and treatment options were discussed and Macrae agreed to follow the recommendations documented in the above note.  ARRANGE: Kaimana was educated on the importance of frequent visits to treat obesity as outlined per CMS and USPSTF guidelines and agreed to schedule his next follow up appointment today.  Attestation Statements:   Reviewed by clinician on day of visit: allergies, medications, problem list, medical history, surgical history, family history, social history, and previous encounter notes.  I, Water quality scientist, CMA, am acting as Location manager for [employer], DO.  I have reviewed the above documentation for accuracy and completeness, and I agree with the above. Briscoe Deutscher, DO

## 2019-10-31 DIAGNOSIS — M1712 Unilateral primary osteoarthritis, left knee: Secondary | ICD-10-CM | POA: Diagnosis not present

## 2019-11-03 ENCOUNTER — Other Ambulatory Visit (INDEPENDENT_AMBULATORY_CARE_PROVIDER_SITE_OTHER): Payer: Self-pay | Admitting: Cardiovascular Disease

## 2019-11-03 NOTE — Telephone Encounter (Signed)
Reviewed active medications.  Parameters for refill met per departmental refill protocol . Refill sent electronically to pharmacy.  Order forwarded to provider for [facility]

## 2019-11-07 DIAGNOSIS — M1712 Unilateral primary osteoarthritis, left knee: Secondary | ICD-10-CM | POA: Diagnosis not present

## 2019-11-09 ENCOUNTER — Encounter (INDEPENDENT_AMBULATORY_CARE_PROVIDER_SITE_OTHER): Payer: Self-pay | Admitting: Family Medicine

## 2019-11-09 ENCOUNTER — Ambulatory Visit (INDEPENDENT_AMBULATORY_CARE_PROVIDER_SITE_OTHER): Payer: Medicare HMO | Admitting: Family Medicine

## 2019-11-09 ENCOUNTER — Other Ambulatory Visit: Payer: Self-pay

## 2019-11-09 VITALS — BP 112/62 | HR 66 | Temp 98.0°F | Ht 65.0 in | Wt 273.0 lb

## 2019-11-09 DIAGNOSIS — I2510 Atherosclerotic heart disease of native coronary artery without angina pectoris: Secondary | ICD-10-CM | POA: Diagnosis not present

## 2019-11-09 DIAGNOSIS — R748 Abnormal levels of other serum enzymes: Secondary | ICD-10-CM

## 2019-11-09 DIAGNOSIS — E8881 Metabolic syndrome: Secondary | ICD-10-CM

## 2019-11-09 DIAGNOSIS — E1169 Type 2 diabetes mellitus with other specified complication: Secondary | ICD-10-CM | POA: Diagnosis not present

## 2019-11-09 DIAGNOSIS — E66813 Obesity, class 3: Secondary | ICD-10-CM

## 2019-11-09 DIAGNOSIS — I700 Atherosclerosis of aorta: Secondary | ICD-10-CM

## 2019-11-09 DIAGNOSIS — Z6841 Body Mass Index (BMI) 40.0 and over, adult: Secondary | ICD-10-CM | POA: Diagnosis not present

## 2019-11-14 MED ORDER — TRULICITY 0.75 MG/0.5ML ~~LOC~~ SOAJ: 0.7500 mg | SUBCUTANEOUS | 0 refills | Status: DC

## 2019-11-14 NOTE — Progress Notes (Signed)
Chief Complaint:   OBESITY Kyair is here to discuss his progress with his obesity treatment plan along with follow-up of his obesity related diagnoses. Nicko is on the Category 1 Plan and states he is following his eating plan approximately 50% of the time. Yeng states he is exercising for 0 minutes 0 times per week.  Today's visit was #: 3 Starting weight: 283 lbs Starting date: 10/12/2019 Today's weight: 273 lbs Today's date: 11/09/2019 Total lbs lost to date: 10 lbs Total lbs lost since last in-office visit: 0  Interim History: Desi received a letter from the [place] stating, "not eligible for GLP-1 RA since no history of CVD or other that would meet approval".  Review of chart:  CLINICAL DATA:  Follow-up pulmonary emboli  EXAM: CT ANGIOGRAPHY CHEST WITH CONTRAST  TECHNIQUE: Multidetector CT imaging of the chest was performed using the standard protocol during bolus administration of intravenous contrast. Multiplanar CT image reconstructions and MIPs were obtained to evaluate the vascular anatomy.  CONTRAST:  122mL ISOVUE-370 IOPAMIDOL (ISOVUE-370) INJECTION 76%  COMPARISON:  05/31/2017  FINDINGS: Cardiovascular: Thoracic aorta is within normal limits with mild atherosclerotic calcifications. No aneurysmal dilatation or dissection is identified. No cardiac enlargement is noted. Scattered coronary calcifications are seen. Pulmonary artery is well visualized and demonstrates a normal branching pattern. Previously seen bilateral pulmonary emboli have resolved in the interval. No new emboli are identified.  Mediastinum/Nodes: Thoracic inlet is within normal limits. The esophagus is unremarkable. No hilar or mediastinal adenopathy is seen.  Lungs/Pleura: Emphysematous changes are noted. Minimal atelectatic changes are noted in the right middle lobe. Previously seen infiltrate in the right lower lobe has resolved in the interval.  Upper Abdomen: No  acute abnormality.  Musculoskeletal: Degenerative changes of the thoracic spine are noted. No compression deformity is seen.  Review of the MIP images confirms the above findings.  IMPRESSION: Previously seen pulmonary emboli have resolved in the interval.  Resolution of previously seen right lower lobe consolidation.  Mild right middle lobe atelectatic changes are noted.  Aortic Atherosclerosis (ICD10-I70.0) and Emphysema (ICD10-J43.9).   Electronically Signed   By: Inez Catalina M.D.   On: 09/23/2017 11:25  Subjective:   1. Type 2 diabetes mellitus without complication, without long-term current use of insulin (HCC) Medications reviewed. Diabetic ROS: no polyuria or polydipsia, no chest pain, dyspnea or TIA's, no numbness, tingling or pain in extremities.   Lab Results  Component Value Date   HGBA1C 8.1 (H) 10/12/2019   HGBA1C 7.5 (H) 07/05/2019   HGBA1C 5.9 (H) 01/04/2019   Lab Results  Component Value Date   LDLCALC 50 10/12/2019   CREATININE 1.71 (H) 10/12/2019   Lab Results  Component Value Date   INSULIN 19.4 10/12/2019   2. Metabolic syndrome Risk factors (3 or more constitute Metabolic Syndrome): waist > 40" for male impaired fasting blood sugar on blood pressure medication on cholesterol medication.  3. Coronary artery disease, angina presence unspecified, unspecified vessel or lesion type, unspecified whether native or transplanted heart Jayvian has the diagnosis of coronary artery disease.    4. Elevated creatine kinase Isadore is unable to take metformin due to his elevated creatinine.  Lab Results  Component Value Date   CREATININE 1.71 (H) 10/12/2019   Assessment/Plan:   1. Type 2 diabetes mellitus without complication, without long-term current use of insulin (HCC) Good blood sugar control is important to decrease the likelihood of diabetic complications such as nephropathy, neuropathy, limb loss, blindness, coronary artery disease,  and  death. Intensive lifestyle modification including diet, exercise and weight loss are the first line of treatment for diabetes.  Will start [name] on Trulicity [id number] mg weekly.  2. Metabolic syndrome Counseling: Intensive lifestyle modifications are the first line treatment for this issue. We discussed several lifestyle modifications today and he will continue to work on diet, exercise and weight loss efforts. We will continue to monitor. Orders and follow up as documented in patient record.  Counseling  Metabolic syndrome is a combination of conditions such as abdominal obesity, high blood sugar, high blood pressure, and unhealthy levels of cholesterol. Metabolic syndrome raises your risk of developing heart disease, diabetes, and stroke.  Following a heart-healthy diet can help you lower your risk of heart disease and improve metabolic syndrome. This diet includes lean proteins, healthy fats, nuts, and plenty of fruits and vegetables.  Along with regular exercise, following a healthy diet can help your body use insulin better and help you lose weight.  A common goal for people with metabolic syndrome is to lose 7-10% of their starting weight.  3. Coronary artery disease We will continue to monitor.  4. Elevated creatine kinase Lab results reviewed with patient.  5. Class 3 severe obesity with serious comorbidity and body mass index (BMI) of 45.0 to 49.9 in adult, unspecified obesity type [facility]) Jhonnie is currently in the action stage of change. As such, his goal is to continue with weight loss efforts. He has agreed to the Category 1 Plan.   Exercise goals: No exercise has been prescribed at this time.  Behavioral modification strategies: increasing lean protein intake and increasing water intake.  Dekendrick has agreed to follow-up with our clinic in 2 weeks. He was informed of the importance of frequent follow-up visits to maximize his success with intensive lifestyle modifications for  his multiple health conditions.   Objective:   Blood pressure 112/62, pulse 66, temperature 98 F (36.7 C), temperature source Oral, height 5\' 5"  (1.651 m), weight 273 lb (123.8 kg), SpO2 90 %. Body mass index is 45.43 kg/m.  General: Cooperative, alert, well developed, in no acute distress. HEENT: Conjunctivae and lids unremarkable. Cardiovascular: Regular rhythm.  Lungs: Normal work of breathing. Neurologic: No focal deficits.   Lab Results  Component Value Date   CREATININE 1.71 (H) 10/12/2019   BUN 25 10/12/2019   NA 141 10/12/2019   K 4.4 10/12/2019   CL 98 10/12/2019   CO2 26 10/12/2019   Lab Results  Component Value Date   ALT 16 10/12/2019   AST 24 10/12/2019   ALKPHOS 117 10/12/2019   BILITOT 0.6 10/12/2019   Lab Results  Component Value Date   HGBA1C 8.1 (H) 10/12/2019   HGBA1C 7.5 (H) 07/05/2019   HGBA1C 5.9 (H) 01/04/2019   HGBA1C 6.2 09/15/2017   HGBA1C 6.3 03/12/2017   Lab Results  Component Value Date   INSULIN 19.4 10/12/2019   Lab Results  Component Value Date   TSH 3.050 10/12/2019   Lab Results  Component Value Date   CHOL 109 10/12/2019   HDL 30 (L) 10/12/2019   LDLCALC 50 10/12/2019   LDLDIRECT 65.9 09/13/2007   TRIG 173 (H) 10/12/2019   CHOLHDL 3.6 10/12/2019   Lab Results  Component Value Date   WBC 9.4 10/12/2019   HGB 14.8 10/12/2019   HCT 43.7 10/12/2019   MCV 89 10/12/2019   PLT 211 10/12/2019   Lab Results  Component Value Date   IRON 86 10/12/2019   TIBC  297 10/12/2019   FERRITIN 80 10/12/2019   Obesity Behavioral Intervention:   Approximately 15 minutes were spent on the discussion below.  ASK: We discussed the diagnosis of obesity with [name] today and Zekhi agreed to give [name] permission to discuss obesity behavioral modification therapy today.  ASSESS: Laikyn has the diagnosis of obesity and his BMI today is 45.4. Chaun is in the action stage of change.   ADVISE: Jasn was educated on the multiple  health risks of obesity as well as the benefit of weight loss to improve his health. He was advised of the need for long term treatment and the importance of lifestyle modifications to improve his current health and to decrease his risk of future health problems.  AGREE: Multiple dietary modification options and treatment options were discussed and Ervie agreed to follow the recommendations documented in the above note.  ARRANGE: Woodson was educated on the importance of frequent visits to treat obesity as outlined per CMS and USPSTF guidelines and agreed to schedule his next follow up appointment today.  Attestation Statements:   Reviewed by clinician on day of visit: allergies, medications, problem list, medical history, surgical history, family history, social history, and previous encounter notes.  I, Water quality scientist, CMA, am acting as Location manager for [employer], DO.  I have reviewed the above documentation for accuracy and completeness, and I agree with the above. Briscoe Deutscher, DO

## 2019-11-15 DIAGNOSIS — I700 Atherosclerosis of aorta: Secondary | ICD-10-CM | POA: Insufficient documentation

## 2019-11-15 DIAGNOSIS — I2510 Atherosclerotic heart disease of native coronary artery without angina pectoris: Secondary | ICD-10-CM | POA: Insufficient documentation

## 2019-11-15 DIAGNOSIS — E8881 Metabolic syndrome: Secondary | ICD-10-CM | POA: Insufficient documentation

## 2019-11-15 DIAGNOSIS — R748 Abnormal levels of other serum enzymes: Secondary | ICD-10-CM | POA: Insufficient documentation

## 2019-12-05 ENCOUNTER — Encounter: Payer: Self-pay | Admitting: Family Medicine

## 2019-12-05 ENCOUNTER — Other Ambulatory Visit: Payer: Self-pay

## 2019-12-05 ENCOUNTER — Encounter (INDEPENDENT_AMBULATORY_CARE_PROVIDER_SITE_OTHER): Payer: Self-pay | Admitting: Family Medicine

## 2019-12-05 ENCOUNTER — Ambulatory Visit: Payer: Medicare HMO | Admitting: Family Medicine

## 2019-12-05 ENCOUNTER — Ambulatory Visit (INDEPENDENT_AMBULATORY_CARE_PROVIDER_SITE_OTHER): Payer: Medicare HMO | Admitting: Family Medicine

## 2019-12-05 VITALS — BP 111/66 | HR 71 | Temp 97.7°F | Ht 65.0 in | Wt 273.0 lb

## 2019-12-05 DIAGNOSIS — Z6841 Body Mass Index (BMI) 40.0 and over, adult: Secondary | ICD-10-CM | POA: Diagnosis not present

## 2019-12-05 DIAGNOSIS — E1169 Type 2 diabetes mellitus with other specified complication: Secondary | ICD-10-CM

## 2019-12-05 DIAGNOSIS — M1712 Unilateral primary osteoarthritis, left knee: Secondary | ICD-10-CM | POA: Diagnosis not present

## 2019-12-05 DIAGNOSIS — R600 Localized edema: Secondary | ICD-10-CM

## 2019-12-05 DIAGNOSIS — E8881 Metabolic syndrome: Secondary | ICD-10-CM

## 2019-12-05 DIAGNOSIS — G5600 Carpal tunnel syndrome, unspecified upper limb: Secondary | ICD-10-CM | POA: Insufficient documentation

## 2019-12-05 DIAGNOSIS — M1909 Primary osteoarthritis, other specified site: Secondary | ICD-10-CM | POA: Diagnosis not present

## 2019-12-05 DIAGNOSIS — G5603 Carpal tunnel syndrome, bilateral upper limbs: Secondary | ICD-10-CM

## 2019-12-05 NOTE — Assessment & Plan Note (Signed)
Appears to be bilateral.  Right greater than left.  Knee brace given and home exercises.  Discussed the gabapentin.  Follow-up again 4 to 8 weeks worsening pain consider potential injection.

## 2019-12-05 NOTE — Progress Notes (Signed)
[place] Phone: [phone] Subjective:   [name], am serving as a scribe for Dr. Hulan Saas. This visit occurred during the SARS-CoV-2 public health emergency.  Safety protocols were in place, including screening questions prior to the visit, additional usage of staff PPE, and extensive cleaning of exam room while observing appropriate contact time as indicated for disinfecting solutions.   I'm seeing this patient by the request  of:  Hoyt Koch, MD  CC: Knee pain follow-up, hand pain bilateral  OFB:[id number]   10/10/2019 Severe overall.  Has failed all conservative therapy.  Unable to get a knee replacement secondary to comorbidities, morbid obesity, blood thinner to name a few.  Patient will be given of steroid injection today.  Can repeat every 8 weeks.  Follow-up sooner and could continue with a potential Toradol injection.  Patient is agreement with the plan.  Update 12/05/2019 DRUE HARR is a 74 y.o. male coming in with complaint of left knee pain. Patient states that the last injection did not provide him with any relief.  Patient states that unfortunately continues to have difficulty with the knee.  Pain on a daily basis.  Can even get him pain at night.  Increasing instability.  Attempting to lose weight so he would have better success with knee replacement but is finding it difficult.  Thinking he is going to need the potential surgery before he even loses weight if that is an option.  Patient states that he is becoming more more frustrated.      Past Medical History:  Diagnosis Date  . Acute kidney failure ([place])   . Arthritis   . Back pain   . BPH (benign prostatic hypertrophy)   . Clostridium difficile infection   . Clotting disorder ([place])   . Depression   . Diabetes ([place]) 12/11/2016  . DVT (deep venous thrombosis) ([place])   . DVT of deep femoral vein, right ([place]) 06/29/2018    . Edema, lower extremity   . High cholesterol   . Hypertension   . Joint pain   . Low back pain potentially associated with spinal stenosis   . Neuromuscular disorder ([place])   . Neuropathy of lower extremity    bilateral  . OSA (obstructive sleep apnea)   . Osteoarthritis   . PE (pulmonary embolism)   . Ventral hernia    Past Surgical History:  Procedure Laterality Date  . EYE SURGERY    . HERNIA REPAIR  1999   Social History   Socioeconomic History  . Marital status: Widowed    Spouse name: Not on file  . Number of children: 2  . Years of education: Not on file  . Highest education level: Not on file  Occupational History  . Occupation: Retired  Tobacco Use  . Smoking status: Former Smoker    Packs/day: 2.00    Years: 33.00    Pack years: 66.00    Types: Cigarettes    Start date: 08/30/1968    Quit date: 07/02/2001    Years since quitting: 18.4  . Smokeless tobacco: Never Used  . Tobacco comment: quit 12 years ago  Vaping Use  . Vaping Use: Never used  Substance and Sexual Activity  . Alcohol use: Yes    Comment: occasional  . Drug use: No  . Sexual activity: Not Currently  Other Topics Concern  . Not on file  Social History Narrative   [employer]  [employer]-2 years   Married '66    2 sons '68, '72; 3 grandchildren   Sales-petroleum Occupational hygienist.  Retired.    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    Social Determinants of Health   Financial Resource Strain:   . Difficulty of Paying Living Expenses:   Food Insecurity:   . Worried About Charity fundraiser in the Last Year:   . Arboriculturist in the Last Year:   Transportation Needs:   . Film/video editor (Medical):   Marland Kitchen Lack of Transportation (Non-Medical):   Physical Activity:   . Days of Exercise per Week:   . Minutes of Exercise per Session:   Stress:   . Feeling of Stress :   Social Connections:   . Frequency of Communication with Friends and Family:   . Frequency of Social Gatherings with  Friends and Family:   . Attends Religious Services:   . Active Member of Clubs or Organizations:   . Attends Archivist Meetings:   Marland Kitchen Marital Status:    Allergies  Allergen Reactions  . Codeine Sulfate Itching and Nausea Only   Family History  Problem Relation Age of Onset  . Diabetes Mother   . Pneumonia Mother   . Alzheimer's disease Mother   . Hyperlipidemia Mother   . Thyroid disease Mother   . Depression Mother   . Other Father 20       Drowned on boating accident  . Colon cancer Neg Hx   . Colon polyps Neg Hx     Current Outpatient Medications (Endocrine & Metabolic):  Marland Kitchen  Dulaglutide (TRULICITY) 6.54 YT/0.3TW SOPN, Inject 0.75 mg into the skin once a week.  Current Outpatient Medications (Cardiovascular):  .  atenolol (TENORMIN) 25 MG tablet, Take 12.5 mg by mouth daily. Marland Kitchen  atorvastatin (LIPITOR) 80 MG tablet, Take 40 mg by mouth daily. .  hydrochlorothiazide (HYDRODIURIL) 25 MG tablet, Take 25 mg by mouth daily.   Current Outpatient Medications (Analgesics):  .  acetaminophen (TYLENOL) 500 MG tablet, Take 1,000 mg by mouth as needed.  Current Outpatient Medications (Hematological):  .  apixaban (ELIQUIS) 5 MG TABS tablet, Take 1 tablet (5 mg total) by mouth 2 (two) times daily.  Current Outpatient Medications (Other):  Marland Kitchen  buPROPion (WELLBUTRIN SR) 150 MG 12 hr tablet, Take 150 mg by mouth 2 (two) times daily. .  finasteride (PROSCAR) 5 MG tablet, Take 5 mg by mouth at bedtime. .  gabapentin (NEURONTIN) 300 MG capsule, 600 mg. Take 2 tablets three daily AND 300 MG AT BEDTIME .  ketoconazole (NIZORAL) 2 % shampoo, Apply topically daily. .  Menthol-Methyl Salicylate (THERA-GESIC PLUS) CREA, Apply 1 application topically at bedtime. Apply to lower back .  Multiple Vitamins-Minerals (MULTIVITAMINS THER. W/MINERALS) TABS, Take 1 tablet by mouth daily. .  nortriptyline (PAMELOR) 10 MG capsule, 2 CAPSULES EVERY NIGHT AT BEDTIME .  saccharomyces boulardii  (FLORASTOR) 250 MG capsule, Take 1 capsule (250 mg total) by mouth 2 (two) times daily. Marland Kitchen  VISCO-3 25 MG/2.5ML SOSY,    Reviewed prior external information including notes and imaging from  primary care provider As well as notes that were available from care everywhere and other healthcare systems.  Past medical history, social, surgical and family history all reviewed in electronic medical record.  No pertanent information unless stated regarding to the chief complaint.   Review of Systems:  No headache, visual changes, nausea, vomiting, diarrhea, constipation, dizziness, abdominal pain, skin  rash, fevers, chills, night sweats, weight loss, swollen lymph nodes, body aches, joint swelling, chest pain, shortness of breath, mood changes. POSITIVE muscle aches  Objective  Blood pressure 122/70, pulse 72, height 5\' 5"  (1.651 m), weight 278 lb (126.1 kg), SpO2 96 %.   General: No apparent distress alert and oriented x3 mood and affect normal, dressed appropriately.  Obese, face asymmetry noted Respiratory: Patient's speak in full sentences and does not appear short of breath  Cardiovascular: Trace lower extremity edema, non tender, no erythema  Neuro: Cranial nerves II through XII are intact, neurovascularly intact in all extremities with 2+ DTRs and 2+ pulses.  Gait antalgic walking with the aid of a cane MSK:  tender with mild limited range of motion arthritic changes of multiple joints Knee: Left valgus deformity noted. Large thigh to calf ratio.  Tender to palpation over medial and PF joint line.  ROM full in flexion and extension and lower leg rotation. instability with valgus force.  painful patellar compression. Patellar glide with moderate crepitus. Patellar and quadriceps tendons unremarkable. Hamstring and quadriceps strength is normal. Contralateral knee shows no significant instability but mildly tender  Patient's hands show that patient does have some mild atrophy noted of  the thenar eminence right greater than left.  Mild positive Phalen's test on the right.  Good grip strength still at this time.  After informed written and verbal consent, patient was seated on exam table. Left knee was prepped with alcohol swab and utilizing anterolateral approach, patient's left knee space was injected with 4:1  marcaine 0.5%: Kenalog 40mg /dL. Patient tolerated the procedure well without immediate complications.   Impression and Recommendations:     The above documentation has been reviewed and is accurate and complete Lyndal Pulley, DO       Note: This dictation was prepared with Dragon dictation along with smaller phrase technology. Any transcriptional errors that result from this process are unintentional.

## 2019-12-05 NOTE — Assessment & Plan Note (Signed)
Patient is failed all conservative therapy at this time.  Patient does have an elevated BMI of 46.  Patient is attempting to lose weight and working with healthy weight and wellness but is still having difficulty with losing weight.  Patient feels like if he was able to get a replacement of the knee he would be able to lose weight potentially better.  Patient is motivated but also disheartened at the moment.  Feels that also of the lack of activity is causing him to feel more melancholy.  I will consider the possibility of referring to another orthopedic surgeon to discuss potential if surgical intervention is possible.  I will try to touch base and see what they think.  Follow-up in 8 weeks otherwise

## 2019-12-05 NOTE — Patient Instructions (Addendum)
Injected knee Brace day and night for 2 weeks then at night for 2 weeks Exercises 3x a week See me again in 8 weeks

## 2019-12-07 DIAGNOSIS — M25462 Effusion, left knee: Secondary | ICD-10-CM | POA: Diagnosis not present

## 2019-12-11 NOTE — Progress Notes (Signed)
Chief Complaint:   OBESITY Tanner [name] is here to discuss his progress with his obesity treatment plan along with follow-up of his obesity related diagnoses. Tanner [name] is on the Category 4 Plan and states he is following his eating plan approximately 50% of the time. Tanner [name] states he is walking and stretching for exercise.  Today's visit was #: 4 Starting weight: 283 lbs Starting date: 10/12/2019 Today's weight: 273 lbs Today's date: 12/05/2019 Total lbs lost to date: 10 lbs Total lbs lost since last in-office visit: 0  Interim History: Tanner [name] says that he has been skipping breakfast.  He will start drinking Glucerna.  Perhaps Starling Manns has [facility] options.  Subjective:   1. Metabolic syndrome Risk factors (3 or more constitute Metabolic Syndrome): waist > 40" for male impaired fasting blood sugar on blood pressure medication on cholesterol medication.  2. Type 2 diabetes mellitus with other specified complication, without long-term current use of insulin (HCC) Medications reviewed. Diabetic ROS: no polyuria or polydipsia, no chest pain, dyspnea or TIA's, no numbness, tingling or pain in extremities.  He is taking Trulicity 2.50 mg weekly.  He has had 2 doses with no side effects.  Lab Results  Component Value Date   HGBA1C 8.1 (H) 10/12/2019   HGBA1C 7.5 (H) 07/05/2019   HGBA1C 5.9 (H) 01/04/2019   Lab Results  Component Value Date   LDLCALC 50 10/12/2019   CREATININE 1.71 (H) 10/12/2019   Lab Results  Component Value Date   INSULIN 19.4 10/12/2019   3. Osteoarthritis of other site [name] needs left TKR.  4. Lower extremity edema [name] is experiencing edema in his lower extremities.  He is taking HCTZ.  Assessment/Plan:   1. Metabolic syndrome Counseling: Intensive lifestyle modifications are the first line treatment for this issue. We discussed several lifestyle modifications today and he will continue to work on diet, exercise and weight loss efforts. We will  continue to monitor. Orders and follow up as documented in patient record.  Counseling  Metabolic syndrome is a combination of conditions such as abdominal obesity, high blood sugar, high blood pressure, and unhealthy levels of cholesterol. Metabolic syndrome raises your risk of developing heart disease, diabetes, and stroke.  Following a heart-healthy diet can help you lower your risk of heart disease and improve metabolic syndrome. This diet includes lean proteins, healthy fats, nuts, and plenty of fruits and vegetables.  Along with regular exercise, following a healthy diet can help your body use insulin better and help you lose weight.  A common goal for people with metabolic syndrome is to lose 7-10% of their starting weight.  2. Type 2 diabetes mellitus with other specified complication, without long-term current use of insulin (HCC) Good blood sugar control is important to decrease the likelihood of diabetic complications such as nephropathy, neuropathy, limb loss, blindness, coronary artery disease, and death. Intensive lifestyle modification including diet, exercise and weight loss are the first line of treatment for diabetes.  He will continue Trulicity and take MiraLAX and Metamucil for bowel regimen.  Orders - Dulaglutide (TRULICITY) 1.5 NL/9.7QB SOPN; Inject 0.5 mLs (1.5 mg total) into the skin once a week.  Dispense: 4 pen; Refill: 0  3. Osteoarthritis of other site, unspecified osteoarthritis type Will continue to work on decreasing BMI to below 39.5.   4. Lower extremity edema Morse will stop HCTZ due to creatinine elevation to 1.71.  He will start Lasix 20 mg 2 tablets int he morning and potassium 10 mEq 4 tablets daily  with Lasix.  Orders - furosemide (LASIX) 20 MG tablet; Take 2 tablets (40 mg total) by mouth every morning.  Dispense: 60 tablet; Refill: 0 - potassium chloride (KLOR-CON) 10 MEQ tablet; Take 4 tablets (40 mEq total) by mouth daily.  Dispense: 120 tablet;  Refill: 0  5. Class 3 severe obesity with serious comorbidity and body mass index (BMI) of 45.0 to 49.9 in adult, unspecified obesity type [facility]) Tanner [name] is currently in the action stage of change. As such, his goal is to continue with weight loss efforts. He has agreed to the Category 1 Plan.   Exercise goals: Older adults should follow the adult guidelines. When older adults cannot meet the adult guidelines, they should be as physically active as their abilities and conditions will allow.  Older adults should do exercises that maintain or improve balance if they are at risk of falling.   Behavioral modification strategies: increasing lean protein intake and increasing water intake.  Tanner [name] has agreed to follow-up with our clinic in 3 weeks. He was informed of the importance of frequent follow-up visits to maximize his success with intensive lifestyle modifications for his multiple health conditions.   Objective:   Blood pressure 111/66, pulse 71, temperature 97.7 F (36.5 C), temperature source Oral, height 5\' 5"  (6.759 m), weight 273 lb (123.8 kg), SpO2 91 %. Body mass index is 45.43 kg/m.  General: Cooperative, alert, well developed, in no acute distress. HEENT: Conjunctivae and lids unremarkable. Cardiovascular: Regular rhythm.  Lungs: Normal work of breathing. Neurologic: No focal deficits.   Lab Results  Component Value Date   CREATININE 1.71 (H) 10/12/2019   BUN 25 10/12/2019   NA 141 10/12/2019   K 4.4 10/12/2019   CL 98 10/12/2019   CO2 26 10/12/2019   Lab Results  Component Value Date   ALT 16 10/12/2019   AST 24 10/12/2019   ALKPHOS 117 10/12/2019   BILITOT 0.6 10/12/2019   Lab Results  Component Value Date   HGBA1C 8.1 (H) 10/12/2019   HGBA1C 7.5 (H) 07/05/2019   HGBA1C 5.9 (H) 01/04/2019   HGBA1C 6.2 09/15/2017   HGBA1C 6.3 03/12/2017   Lab Results  Component Value Date   INSULIN 19.4 10/12/2019   Lab Results  Component Value Date   TSH 3.050  10/12/2019   Lab Results  Component Value Date   CHOL 109 10/12/2019   HDL 30 (L) 10/12/2019   LDLCALC 50 10/12/2019   LDLDIRECT 65.9 09/13/2007   TRIG 173 (H) 10/12/2019   CHOLHDL 3.6 10/12/2019   Lab Results  Component Value Date   WBC 9.4 10/12/2019   HGB 14.8 10/12/2019   HCT 43.7 10/12/2019   MCV 89 10/12/2019   PLT 211 10/12/2019   Lab Results  Component Value Date   IRON 86 10/12/2019   TIBC 297 10/12/2019   FERRITIN 80 10/12/2019   Obesity Behavioral Intervention:   Approximately 15 minutes were spent on the discussion below.  ASK: We discussed the diagnosis of obesity with [name] today and Shahzad agreed to give [name] permission to discuss obesity behavioral modification therapy today.  ASSESS: Taesean has the diagnosis of obesity and his BMI today is 45.5. Avner is in the action stage of change.   ADVISE: Garv was educated on the multiple health risks of obesity as well as the benefit of weight loss to improve his health. He was advised of the need for long term treatment and the importance of lifestyle modifications to improve his current health and  to decrease his risk of future health problems.  AGREE: Multiple dietary modification options and treatment options were discussed and Teofilo agreed to follow the recommendations documented in the above note.  ARRANGE: Zolton was educated on the importance of frequent visits to treat obesity as outlined per CMS and USPSTF guidelines and agreed to schedule his next follow up appointment today.  Attestation Statements:   Reviewed by clinician on day of visit: allergies, medications, problem list, medical history, surgical history, family history, social history, and previous encounter notes.  I, Water quality scientist, CMA, am acting as transcriptionist for Briscoe Deutscher, DO  I have reviewed the above documentation for accuracy and completeness, and I agree with the above. Briscoe Deutscher, DO

## 2019-12-12 ENCOUNTER — Other Ambulatory Visit: Payer: Self-pay

## 2019-12-12 ENCOUNTER — Ambulatory Visit (INDEPENDENT_AMBULATORY_CARE_PROVIDER_SITE_OTHER): Payer: Medicare HMO | Admitting: Internal Medicine

## 2019-12-12 ENCOUNTER — Encounter: Payer: Self-pay | Admitting: Internal Medicine

## 2019-12-12 VITALS — BP 126/82 | HR 75 | Temp 98.6°F | Ht 65.0 in | Wt 282.0 lb

## 2019-12-12 DIAGNOSIS — Z86711 Personal history of pulmonary embolism: Secondary | ICD-10-CM | POA: Diagnosis not present

## 2019-12-12 DIAGNOSIS — N1831 Chronic kidney disease, stage 3a: Secondary | ICD-10-CM | POA: Diagnosis not present

## 2019-12-12 DIAGNOSIS — I10 Essential (primary) hypertension: Secondary | ICD-10-CM | POA: Diagnosis not present

## 2019-12-12 DIAGNOSIS — E1169 Type 2 diabetes mellitus with other specified complication: Secondary | ICD-10-CM | POA: Diagnosis not present

## 2019-12-12 DIAGNOSIS — Z6841 Body Mass Index (BMI) 40.0 and over, adult: Secondary | ICD-10-CM | POA: Diagnosis not present

## 2019-12-12 DIAGNOSIS — M1710 Unilateral primary osteoarthritis, unspecified knee: Secondary | ICD-10-CM

## 2019-12-12 LAB — RENAL FUNCTION PANEL
Albumin: 3.8 g/dL (ref 3.5–5.2)
BUN: 19 mg/dL (ref 6–23)
CO2: 31 mEq/L (ref 19–32)
Calcium: 8.8 mg/dL (ref 8.4–10.5)
Chloride: 103 mEq/L (ref 96–112)
Creatinine, Ser: 1.43 mg/dL (ref 0.40–1.50)
GFR: 48.19 mL/min — ABNORMAL LOW (ref >60.00)
Glucose, Bld: 97 mg/dL (ref 70–99)
Phosphorus: 2.5 mg/dL (ref 2.3–4.6)
Potassium: 3.7 mEq/L (ref 3.5–5.1)
Sodium: 140 mEq/L (ref 135–145)

## 2019-12-12 MED ORDER — TRULICITY 1.5 MG/0.5ML ~~LOC~~ SOAJ: 1.5000 mg | SUBCUTANEOUS | 0 refills | Status: DC

## 2019-12-12 MED ORDER — FUROSEMIDE 20 MG PO TABS: 40.0000 mg | ORAL_TABLET | ORAL | 0 refills | Status: DC

## 2019-12-12 MED ORDER — POTASSIUM CHLORIDE ER 10 MEQ PO TBCR: 40.0000 meq | EXTENDED_RELEASE_TABLET | Freq: Every day | ORAL | 0 refills | Status: DC

## 2019-12-12 NOTE — Progress Notes (Signed)
Subjective:   Patient ID: Tanner [name], male    DOB: [birth year], 74 y.o.   MRN: [id number]  HPI The patient is a 74 YO man coming in for follow up of his medical conditions including type 2 diabetes (last seen 2019, has had labs checked recently with uncontrolled sugars to 8.1 with weight loss clinic, they advised he follow up and started him on trulicity, he is currently on .75 mg weekly dosing and they will have him increase to 1.5 mg weekly, gets care from [place] and he is upset that he underwent medical exam for benefits with them and because he wasn't on meds for it they declined that diagnosis, previously on metformin but due to rising creatinine likely he was taken off it, has neuropathy and taking gabapentin for that without significant relief except at night time so he can sleep), and cholesterol (taking lipitor 80 mg daily, denies side effects, denies chest pains or stroke symptoms), and blood pressure (about 2 months ago was taken off hctz due to higher than normal creatinine levels and switched to lasix only, on atenolol as well, not on ACE-I or ARB, denies headaches or chest pains, denies side effects), and past history DVT/PE (recurrent idiopathic and on lifelong eliquis, previously has seen oncology last 12/2018), and OA knee (he is trying to lose weight so he can get knee surgery done, they need him to lose weight before they will schedule, it makes getting around difficult).   Review of Systems  Constitutional: Positive for activity change. Negative for appetite change, fatigue, fever and unexpected weight change.  HENT: Negative.   Eyes: Negative.   Respiratory: Positive for shortness of breath. Negative for cough and chest tightness.   Cardiovascular: Negative for chest pain, palpitations and leg swelling.  Gastrointestinal: Negative for abdominal distention, abdominal pain, constipation, diarrhea, nausea and vomiting.  Musculoskeletal: Positive for arthralgias, back pain and  myalgias.  Skin: Negative.   Neurological: Positive for numbness.  Psychiatric/Behavioral: Negative.     Objective:  Physical Exam Constitutional:      Appearance: He is well-developed. He is obese.  HENT:     Head: Normocephalic and atraumatic.  Cardiovascular:     Rate and Rhythm: Normal rate and regular rhythm.  Pulmonary:     Effort: Pulmonary effort is normal. No respiratory distress.     Breath sounds: Normal breath sounds. No wheezing or rales.  Abdominal:     General: Bowel sounds are normal. There is no distension.     Palpations: Abdomen is soft.     Tenderness: There is no abdominal tenderness. There is no rebound.  Musculoskeletal:     Cervical back: Normal range of motion.  Skin:    General: Skin is warm and dry.     Comments: Foot exam done  Neurological:     Mental Status: He is alert and oriented to person, place, and time.     Coordination: Coordination abnormal.     Comments: Slow to stand     Vitals:   12/12/19 1417  BP: 126/82  Pulse: 75  Temp: 98.6 F (37 C)  TempSrc: Oral  SpO2: 93%  Weight: 282 lb (127.9 kg)  Height: 5\' 5"  (1.651 m)    This visit occurred during the SARS-CoV-2 public health emergency.  Safety protocols were in place, including screening questions prior to the visit, additional usage of staff PPE, and extensive cleaning of exam room while observing appropriate contact time as indicated for disinfecting solutions.  Assessment & Plan:  Visit time 35 minutes in face to face communication with patient and coordination of care, additional 10 minutes spent in record review, coordination or care, ordering tests, communicating/referring to other healthcare professionals, documenting in medical records all on the same day of the visit for total time 45 minutes spent on the visit.

## 2019-12-12 NOTE — Patient Instructions (Addendum)
We will recheck the labs today and call you back about the results.    Diabetes Mellitus and Standards of Medical Care Managing diabetes (diabetes mellitus) can be complicated. Your diabetes treatment may be managed by a team of health care providers, including:  A physician who specializes in diabetes (endocrinologist).  A nurse practitioner or physician assistant.  Nurses.  A diet and nutrition specialist (registered dietitian).  A certified diabetes educator (CDE).  An exercise specialist.  A pharmacist.  An eye doctor.  A foot specialist (podiatrist).  A dentist.  A primary care provider.  A mental health provider. Your health care providers follow guidelines to help you get the best quality of care. The following schedule is a general guideline for your diabetes management plan. Your health care providers may give you more specific instructions. Physical exams Upon being diagnosed with diabetes mellitus, and each year after that, your health care provider will ask about your medical and family history. He or she will also do a physical exam. Your exam may include:  Measuring your height, weight, and body mass index (BMI).  Checking your blood pressure. This will be done at every routine medical visit. Your target blood pressure may vary depending on your medical conditions, your age, and other factors.  Thyroid gland exam.  Skin exam.  Screening for damage to your nerves (peripheral neuropathy). This may include checking the pulse in your legs and feet and checking the level of sensation in your hands and feet.  A complete foot exam to inspect the structure and skin of your feet, including checking for cuts, bruises, redness, blisters, sores, or other problems.  Screening for blood vessel (vascular) problems, which may include checking the pulse in your legs and feet and checking your temperature. Blood tests Depending on your treatment plan and your personal  needs, you may have the following tests done:  HbA1c (hemoglobin A1c). This test provides information about blood sugar (glucose) control over the previous 2-3 months. It is used to adjust your treatment plan, if needed. This test will be done: ? At least 2 times a year, if you are meeting your treatment goals. ? 4 times a year, if you are not meeting your treatment goals or if treatment goals have changed.  Lipid testing, including total, LDL, and HDL cholesterol and triglyceride levels. ? The goal for LDL is less than 100 mg/dL (5.5 mmol/L). If you are at high risk for complications, the goal is less than 70 mg/dL (3.9 mmol/L). ? The goal for HDL is 40 mg/dL (2.2 mmol/L) or higher for men and 50 mg/dL (2.8 mmol/L) or higher for women. An HDL cholesterol of 60 mg/dL (3.3 mmol/L) or higher gives some protection against heart disease. ? The goal for triglycerides is less than 150 mg/dL (8.3 mmol/L).  Liver function tests.  Kidney function tests.  Thyroid function tests. Dental and eye exams  Visit your dentist two times a year.  If you have type 1 diabetes, your health care provider may recommend an eye exam 3-5 years after you are diagnosed, and then once a year after your first exam. ? For children with type 1 diabetes, a health care provider may recommend an eye exam when your child is age 12 or older and has had diabetes for 3-5 years. After the first exam, your child should get an eye exam once a year.  If you have type 2 diabetes, your health care provider may recommend an eye exam as soon as  you are diagnosed, and then once a year after your first exam. Immunizations   The yearly flu (influenza) vaccine is recommended for everyone 6 months or older who has diabetes.  The pneumonia (pneumococcal) vaccine is recommended for everyone 2 years or older who has diabetes. If you are 93 or older, you may get the pneumonia vaccine as a series of two separate shots.  The hepatitis B  vaccine is recommended for adults shortly after being diagnosed with diabetes.  Adults and children with diabetes should receive all other vaccines according to age-specific recommendations from the Centers for Disease Control and Prevention (CDC). Mental and emotional health Screening for symptoms of eating disorders, anxiety, and depression is recommended at the time of diagnosis and afterward as needed. If your screening shows that you have symptoms (positive screening result), you may need more evaluation and you may work with a mental health care provider. Treatment plan Your treatment plan will be reviewed at every medical visit. You and your health care provider will discuss:  How you are taking your medicines, including insulin.  Any side effects you are experiencing.  Your blood glucose target goals.  The frequency of your blood glucose monitoring.  Lifestyle habits, such as activity level as well as tobacco, alcohol, and substance use. Diabetes self-management education Your health care provider will assess how well you are monitoring your blood glucose levels and whether you are taking your insulin correctly. He or she may refer you to:  A certified diabetes educator to manage your diabetes throughout your life, starting at diagnosis.  A registered dietitian who can create or review your personal nutrition plan.  An exercise specialist who can discuss your activity level and exercise plan. Summary  Managing diabetes (diabetes mellitus) can be complicated. Your diabetes treatment may be managed by a team of health care providers.  Your health care providers follow guidelines in order to help you get the best quality of care.  Standards of care including having regular physical exams, blood tests, blood pressure monitoring, immunizations, screening tests, and education about how to manage your diabetes.  Your health care providers may also give you more specific instructions  based on your individual health. This information is not intended to replace advice given to you by your health care provider. Make sure you discuss any questions you have with your health care provider. Document Revised: 02/18/2018 Document Reviewed: 02/28/2016 Elsevier Patient Education  [place].

## 2019-12-13 DIAGNOSIS — N183 Chronic kidney disease, stage 3 unspecified: Secondary | ICD-10-CM | POA: Insufficient documentation

## 2019-12-13 NOTE — Assessment & Plan Note (Signed)
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 Sees hematology yearly and last seen 12/2018. No signs/symptoms of GI bleeding and recent CBC is at goal.

## 2019-12-13 NOTE — Assessment & Plan Note (Signed)
Recent HgA1c above goal of <8. [facility]ed [facility] and too soon for recheck, encouraged him to titrate up to 1.5 mg weekly dosing as recommended by weight loss clinic as he is tolerating well and weight is stable. Not on ACE-I/ARB for unclear reason. Is on statin. Complicated by neuropathy.

## 2019-12-13 NOTE — Assessment & Plan Note (Signed)
Working on weight loss without much recent success. Recent addition of trulicity will hopefully help.

## 2019-12-13 NOTE — Assessment & Plan Note (Signed)
He is working on losing weight to have surgery on his knee. Hopefully trulicity will help as weight seems stable since starting to see [facility]. Encouraged to continue with efforts.

## 2019-12-13 NOTE — Assessment & Plan Note (Signed)
BP at goal on lasix and atenolol. If BP high would add ACE-I or ARB. Recent creatinine was higher than average and rechecking renal function today.

## 2019-12-13 NOTE — Assessment & Plan Note (Signed)
Typically stage 3a and stable for several years with recent worsening. Checking renal function today. Adjust as needed. Does have HTN and DM.

## 2019-12-15 ENCOUNTER — Other Ambulatory Visit (INDEPENDENT_AMBULATORY_CARE_PROVIDER_SITE_OTHER): Payer: Self-pay | Admitting: Family Medicine

## 2019-12-15 NOTE — Telephone Encounter (Signed)
Last Visit: 09/2019    Upcoming appointments: 6/18        Lynwood Dawley, MA  12/15/2019, 12:39

## 2019-12-27 ENCOUNTER — Other Ambulatory Visit: Payer: Self-pay

## 2019-12-27 ENCOUNTER — Ambulatory Visit (INDEPENDENT_AMBULATORY_CARE_PROVIDER_SITE_OTHER): Payer: Medicare HMO | Admitting: Family Medicine

## 2019-12-27 ENCOUNTER — Encounter (INDEPENDENT_AMBULATORY_CARE_PROVIDER_SITE_OTHER): Payer: Self-pay | Admitting: Family Medicine

## 2019-12-27 VITALS — BP 122/70 | HR 78 | Temp 98.2°F | Ht 65.0 in | Wt 275.0 lb

## 2019-12-27 DIAGNOSIS — Z6841 Body Mass Index (BMI) 40.0 and over, adult: Secondary | ICD-10-CM | POA: Diagnosis not present

## 2019-12-27 DIAGNOSIS — M1710 Unilateral primary osteoarthritis, unspecified knee: Secondary | ICD-10-CM

## 2019-12-27 DIAGNOSIS — R609 Edema, unspecified: Secondary | ICD-10-CM

## 2019-12-27 DIAGNOSIS — E66813 Obesity, class 3: Secondary | ICD-10-CM

## 2019-12-27 MED ORDER — FUROSEMIDE 20 MG PO TABS: 40.0000 mg | ORAL_TABLET | Freq: Two times a day (BID) | ORAL | 0 refills | Status: DC

## 2019-12-27 MED ORDER — POTASSIUM CHLORIDE ER 10 MEQ PO TBCR: 40.0000 meq | EXTENDED_RELEASE_TABLET | Freq: Two times a day (BID) | ORAL | 0 refills | Status: DC

## 2019-12-27 NOTE — Progress Notes (Signed)
Chief Complaint:   OBESITY Tanner [name] is here to discuss his progress with his obesity treatment plan along with follow-up of his obesity related diagnoses. Tanner [name] is on the Category 1 Plan and states he is following his eating plan approximately 50% of the time. Tanner [name] states he has increased his activity level.  Today's visit was #: 5 Starting weight: 283 lbs Starting date: 10/12/2019 Today's weight: 275 lbs Today's date: 12/27/2019 Total lbs lost to date: 8 lbs Total lbs lost since last in-office visit: 0  Interim History: [place] started Trulicity 1.5 mg subcu last Friday.  He says he has no side effects.  He bought new compression hose, but he is not wearing them.    Lanny provided the following food recall today:  Breakfast:  None or sandwich for breakfast. Lunch:  None. Dinner:  Frozen meal. He has oranges 2-3 times per day.  Subjective:   1. Edema, unspecified type Shamarr is currently taking Lasix 40 mg in the morning and potassium 40 mEq daily.  2. Primary osteoarthritis of knee, unspecified laterality Courtenay needs a total knee replacement.   Assessment/Plan:   1. Edema Will double doses of Lasix and potassium temporarily, as per below.  Orders - furosemide (LASIX) 20 MG tablet; Take 2 tablets (40 mg total) by mouth 2 (two) times daily.  Dispense: 120 tablet; Refill: 0 - potassium chloride (KLOR-CON) 10 MEQ tablet; Take 4 tablets (40 mEq total) by mouth 2 (two) times daily.  Dispense: 240 tablet; Refill: 0  2. Primary osteoarthritis of knee, unspecified laterality Ether's BMI must be below 40 so he can get a total knee replacement.  It is currently 45.7.  3. Class 3 severe obesity with serious comorbidity and body mass index (BMI) of 45.0 to 49.9 in adult, unspecified obesity type [facility]) [place] is currently in the action stage of change. As such, his goal is to continue with weight loss efforts. He has agreed to the Category 1 Plan.   Exercise goals:  Older adults should follow the adult guidelines. When older adults cannot meet the adult guidelines, they should be as physically active as their abilities and conditions will allow.  Older adults should do exercises that maintain or improve balance if they are at risk of falling.   Behavioral modification strategies: increasing lean protein intake, decreasing simple carbohydrates, increasing vegetables, increasing water intake, decreasing sodium intake and increasing high fiber foods.  We discussed lower sodium options.  Jerris has agreed to follow-up with our clinic in 2-3 weeks. He was informed of the importance of frequent follow-up visits to maximize his success with intensive lifestyle modifications for his multiple health conditions.   Objective:   Blood pressure 122/70, pulse 78, temperature 98.2 F (36.8 C), temperature source Oral, height 5\' 5"  (1.651 m), weight 275 lb (124.7 kg), SpO2 90 %. Body mass index is 45.76 kg/m.  General: Cooperative, alert, well developed, in no acute distress. HEENT: Conjunctivae and lids unremarkable. Cardiovascular: Regular rhythm.  Lungs: Normal work of breathing. Neurologic: No focal deficits.   Lab Results  Component Value Date   CREATININE 1.43 12/12/2019   BUN 19 12/12/2019   NA 140 12/12/2019   K 3.7 12/12/2019   CL 103 12/12/2019   CO2 31 12/12/2019   Lab Results  Component Value Date   ALT 16 10/12/2019   AST 24 10/12/2019   ALKPHOS 117 10/12/2019   BILITOT 0.6 10/12/2019   Lab Results  Component Value Date   HGBA1C 8.1 (H)  10/12/2019   HGBA1C 7.5 (H) 07/05/2019   HGBA1C 5.9 (H) 01/04/2019   HGBA1C 6.2 09/15/2017   HGBA1C 6.3 03/12/2017   Lab Results  Component Value Date   INSULIN 19.4 10/12/2019   Lab Results  Component Value Date   TSH 3.050 10/12/2019   Lab Results  Component Value Date   CHOL 109 10/12/2019   HDL 30 (L) 10/12/2019   LDLCALC 50 10/12/2019   LDLDIRECT 65.9 09/13/2007   TRIG 173 (H)  10/12/2019   CHOLHDL 3.6 10/12/2019   Lab Results  Component Value Date   WBC 9.4 10/12/2019   HGB 14.8 10/12/2019   HCT 43.7 10/12/2019   MCV 89 10/12/2019   PLT 211 10/12/2019   Lab Results  Component Value Date   IRON 86 10/12/2019   TIBC 297 10/12/2019   FERRITIN 80 10/12/2019   Obesity Behavioral Intervention:   Approximately 15 minutes were spent on the discussion below.  ASK: We discussed the diagnosis of obesity with [name] today and Jaeden agreed to give [name] permission to discuss obesity behavioral modification therapy today.  ASSESS: Rhet has the diagnosis of obesity and his BMI today is 45.76. [place]an is in the action stage of change.   ADVISE: Maicol was educated on the multiple health risks of obesity as well as the benefit of weight loss to improve his health. He was advised of the need for long term treatment and the importance of lifestyle modifications to improve his current health and to decrease his risk of future health problems.  AGREE: Multiple dietary modification options and treatment options were discussed and Tyeler agreed to follow the recommendations documented in the above note.  ARRANGE: Charon was educated on the importance of frequent visits to treat obesity as outlined per CMS and USPSTF guidelines and agreed to schedule his next follow up appointment today.  Attestation Statements:   Reviewed by clinician on day of visit: allergies, medications, problem list, medical history, surgical history, family history, social history, and previous encounter notes.  I, Water quality scientist, CMA, am acting as transcriptionist for Briscoe Deutscher, DO  I have reviewed the above documentation for accuracy and completeness, and I agree with the above. Briscoe Deutscher, DO

## 2020-01-02 ENCOUNTER — Encounter: Payer: Self-pay | Admitting: Hematology & Oncology

## 2020-01-02 ENCOUNTER — Other Ambulatory Visit: Payer: Self-pay

## 2020-01-02 ENCOUNTER — Inpatient Hospital Stay (HOSPITAL_BASED_OUTPATIENT_CLINIC_OR_DEPARTMENT_OTHER): Payer: Medicare HMO | Admitting: Hematology & Oncology

## 2020-01-02 ENCOUNTER — Inpatient Hospital Stay: Payer: Medicare HMO | Attending: Hematology & Oncology

## 2020-01-02 VITALS — BP 134/61 | HR 78 | Temp 98.1°F | Resp 19 | Wt 279.0 lb

## 2020-01-02 DIAGNOSIS — Z86718 Personal history of other venous thrombosis and embolism: Secondary | ICD-10-CM | POA: Insufficient documentation

## 2020-01-02 DIAGNOSIS — Z7901 Long term (current) use of anticoagulants: Secondary | ICD-10-CM | POA: Diagnosis not present

## 2020-01-02 DIAGNOSIS — Z86711 Personal history of pulmonary embolism: Secondary | ICD-10-CM | POA: Insufficient documentation

## 2020-01-02 DIAGNOSIS — E0800 Diabetes mellitus due to underlying condition with hyperosmolarity without nonketotic hyperglycemic-hyperosmolar coma (NKHHC): Secondary | ICD-10-CM

## 2020-01-02 LAB — CMP (CANCER CENTER ONLY)
ALT: 11 U/L (ref 0–44)
AST: 12 U/L — ABNORMAL LOW (ref 15–41)
Albumin: 4.3 g/dL (ref 3.5–5.0)
Alkaline Phosphatase: 95 U/L (ref 38–126)
Anion gap: 8 (ref 5–15)
BUN: 21 mg/dL (ref 8–23)
CO2: 32 mmol/L (ref 22–32)
Calcium: 9.6 mg/dL (ref 8.9–10.3)
Chloride: 100 mmol/L (ref 98–111)
Creatinine: 1.7 mg/dL — ABNORMAL HIGH (ref 0.61–1.24)
GFR, Est AFR Am: 45 mL/min — ABNORMAL LOW (ref >60)
GFR, Estimated: 39 mL/min — ABNORMAL LOW (ref >60)
Glucose, Bld: 91 mg/dL (ref 70–99)
Potassium: 4.1 mmol/L (ref 3.5–5.1)
Sodium: 140 mmol/L (ref 135–145)
Total Bilirubin: 0.8 mg/dL (ref 0.3–1.2)
Total Protein: 6.5 g/dL (ref 6.5–8.1)

## 2020-01-02 LAB — CBC WITH DIFFERENTIAL (CANCER CENTER ONLY)
Abs Immature Granulocytes: 0.05 10*3/uL (ref 0.00–0.07)
Basophils Absolute: 0.1 10*3/uL (ref 0.0–0.1)
Basophils Relative: 1 %
Eosinophils Absolute: 1.5 10*3/uL — ABNORMAL HIGH (ref 0.0–0.5)
Eosinophils Relative: 13 %
HCT: 47.8 % (ref 39.0–52.0)
Hemoglobin: 15.2 g/dL (ref 13.0–17.0)
Immature Granulocytes: 0 %
Lymphocytes Relative: 19 %
Lymphs Abs: 2.2 10*3/uL (ref 0.7–4.0)
MCH: 29.2 pg (ref 26.0–34.0)
MCHC: 31.8 g/dL (ref 30.0–36.0)
MCV: 91.7 fL (ref 80.0–100.0)
Monocytes Absolute: 0.7 10*3/uL (ref 0.1–1.0)
Monocytes Relative: 6 %
Neutro Abs: 7.1 10*3/uL (ref 1.7–7.7)
Neutrophils Relative %: 61 %
Platelet Count: 195 10*3/uL (ref 150–400)
RBC: 5.21 MIL/uL (ref 4.22–5.81)
RDW: 15.5 % (ref 11.5–15.5)
WBC Count: 11.7 10*3/uL — ABNORMAL HIGH (ref 4.0–10.5)
nRBC: 0 % (ref 0.0–0.2)

## 2020-01-02 NOTE — Progress Notes (Signed)
Hematology and Oncology Follow Up Visit  Tanner [name] [id number] 09-23-1945 74 y.o. 01/02/2020   Principle Diagnosis:  Recurrent pulmonary emboli/left leg thrombus -- Idiopathic  Current Therapy:   Eliquis 5 mg p.o. twice daily - started 06/29/2017   Interim History: Mr. Farrelly is here today for follow-up.  For right now, everything is pretty much stable with him.  It has been a little over a year since his wife passed on.  She passed on 11-08-2018.  He is still not had surgery for his left knee.  I think his weight is the real problem.  I would think that he might need weight loss surgery.,  Sure if he will do this.  He gets a lot of his care at the [facility] in [place].  He does have some stasis dermatitis in his lower legs.  He has some chronic swelling in his lower legs.  He has had no fever.  He has had the coronavirus vaccinations.    Overall, his performance status is ECOG 1.    Medications:  Allergies as of 01/02/2020      Reactions   Codeine Sulfate Itching, Nausea Only      Medication List       Accurate as of January 02, 2020 12:23 PM. If you have any questions, ask your nurse or doctor.        STOP taking these medications   Visco-3 25 MG/2.5ML Sosy Generic drug: Sodium Hyaluronate Stopped by: Volanda Napoleon, MD     TAKE these medications   acetaminophen 500 MG tablet Commonly known as: TYLENOL Take 1,000 mg by mouth as needed.   apixaban 5 MG Tabs tablet Commonly known as: Eliquis Take 1 tablet (5 mg total) by mouth 2 (two) times daily.   atenolol 25 MG tablet Commonly known as: TENORMIN Take 12.5 mg by mouth daily.   atorvastatin 80 MG tablet Commonly known as: LIPITOR Take 40 mg by mouth daily.   buPROPion 150 MG 12 hr tablet Commonly known as: WELLBUTRIN SR Take 150 mg by mouth 2 (two) times daily.   finasteride 5 MG tablet Commonly known as: PROSCAR Take 5 mg by mouth at bedtime.   furosemide 20 MG tablet Commonly known as:  LASIX Take 2 tablets (40 mg total) by mouth 2 (two) times daily.   gabapentin 300 MG capsule Commonly known as: NEURONTIN 600 mg. Take 2 tablets three daily AND 300 MG AT BEDTIME   ketoconazole 2 % shampoo Commonly known as: NIZORAL Apply topically daily.   multivitamins ther. w/minerals Tabs tablet Take 1 tablet by mouth daily.   nortriptyline 10 MG capsule Commonly known as: PAMELOR 2 CAPSULES EVERY NIGHT AT BEDTIME   potassium chloride 10 MEQ tablet Commonly known as: KLOR-CON Take 4 tablets (40 mEq total) by mouth 2 (two) times daily.   saccharomyces boulardii 250 MG capsule Commonly known as: FLORASTOR Take 1 capsule (250 mg total) by mouth 2 (two) times daily.   Thera-Gesic Plus Crea Apply 1 application topically at bedtime. Apply to lower back   Trulicity 1.5 OQ/9.4TM Sopn Generic drug: Dulaglutide Inject 0.5 mLs (1.5 mg total) into the skin once a week.       Allergies:  Allergies  Allergen Reactions  . Codeine Sulfate Itching and Nausea Only    Past Medical History, Surgical history, Social history, and Family History were reviewed and updated.  Review of Systems: Review of Systems  Constitutional: Negative.   HENT: Negative.   Eyes:  Negative.   Respiratory: Negative.   Cardiovascular: Negative.   Gastrointestinal: Negative.   Genitourinary: Negative.   Musculoskeletal: Negative.   Skin: Negative.   Neurological: Negative.   Endo/Heme/Allergies: Negative.   Psychiatric/Behavioral: Negative.      Physical Exam:  weight is 279 lb (126.6 kg). His oral temperature is 98.1 F (36.7 C). His blood pressure is 134/61 and his pulse is 78. His respiration is 19 and oxygen saturation is 94%.   Wt Readings from Last 3 Encounters:  01/02/20 279 lb (126.6 kg)  12/27/19 275 lb (124.7 kg)  12/12/19 282 lb (127.9 kg)    Physical Exam Vitals reviewed.  HENT:     Head: Normocephalic and atraumatic.  Eyes:     Pupils: Pupils are equal, round, and  reactive to light.  Cardiovascular:     Rate and Rhythm: Normal rate and regular rhythm.     Heart sounds: Normal heart sounds.  Pulmonary:     Effort: Pulmonary effort is normal.     Breath sounds: Normal breath sounds.  Abdominal:     General: Bowel sounds are normal.     Palpations: Abdomen is soft.  Musculoskeletal:        General: No tenderness or deformity. Normal range of motion.     Cervical back: Normal range of motion.  Lymphadenopathy:     Cervical: No cervical adenopathy.  Skin:    General: Skin is warm and dry.     Findings: No erythema or rash.  Neurological:     Mental Status: He is alert and oriented to person, place, and time.  Psychiatric:        Behavior: Behavior normal.        Thought Content: Thought content normal.        Judgment: Judgment normal.      Lab Results  Component Value Date   WBC 11.7 (H) 01/02/2020   HGB 15.2 01/02/2020   HCT 47.8 01/02/2020   MCV 91.7 01/02/2020   PLT 195 01/02/2020   Lab Results  Component Value Date   FERRITIN 80 10/12/2019   IRON 86 10/12/2019   TIBC 297 10/12/2019   UIBC 211 10/12/2019   IRONPCTSAT 29 10/12/2019   Lab Results  Component Value Date   RETICCTPCT 2.5 10/12/2019   RBC 5.21 01/02/2020   No results found for: KPAFRELGTCHN, LAMBDASER, KAPLAMBRATIO Lab Results  Component Value Date   IGGSERUM 549 (L) 04/07/2017   IGMSERUM 144 04/07/2017   Lab Results  Component Value Date   ALBUMINELP 3.7 (L) 04/07/2017   A1GS 0.4 (H) 04/07/2017   A2GS 0.8 04/07/2017   BETS 0.5 04/07/2017   BETA2SER 0.3 04/07/2017   GAMS 0.6 (L) 04/07/2017   SPEI  04/07/2017     Comment:     . One or more serum protein fractions are outside the normal ranges.  No abnormal protein bands are apparent. .      Chemistry      Component Value Date/Time   NA 140 01/02/2020 1127   NA 141 10/12/2019 1459   NA 136 12/29/2013 1356   K 4.1 01/02/2020 1127   K 3.4 12/29/2013 1356   CL 100 01/02/2020 1127   CL 97 (L)  12/29/2013 1356   CO2 32 01/02/2020 1127   CO2 33 12/29/2013 1356   BUN 21 01/02/2020 1127   BUN 25 10/12/2019 1459   BUN 15 12/29/2013 1356   CREATININE 1.70 (H) 01/02/2020 1127   CREATININE 1.2 12/29/2013 1356  GLU 84 11/07/2014 0000      Component Value Date/Time   CALCIUM 9.6 01/02/2020 1127   CALCIUM 8.9 12/29/2013 1356   ALKPHOS 95 01/02/2020 1127   ALKPHOS 59 12/29/2013 1356   AST 12 (L) 01/02/2020 1127   ALT 11 01/02/2020 1127   ALT 27 12/29/2013 1356   BILITOT 0.8 01/02/2020 1127      Impression and Plan: Mr. Tuch is a very pleasant 74 yo caucasian gentleman with recurrent pulmonary embolism/thromboembolic disease.  He is on lifelong Eliquis.  I do not think we have to do any scans on him.  I do not think we need any Dopplers right now.  He has a chronic nonocclusive thrombus in his left leg.  If he ever has surgery for the left knee, I do not see any problems with him having this.  I would to stop his Eliquis for a couple days.  I suppose that to make things safer for him, we can was have a temporary IVC filter placed.  I told him to make sure he lets [name] know if he is going to have knee surgery.    We will go ahead and plan to see him back in 6 months.  I think this would be reasonable at this point. We can certainly see him sooner if need be.    Volanda Napoleon, MD 7/20/202112:23 PM

## 2020-01-19 ENCOUNTER — Encounter (INDEPENDENT_AMBULATORY_CARE_PROVIDER_SITE_OTHER): Payer: Self-pay

## 2020-01-23 ENCOUNTER — Other Ambulatory Visit: Payer: Self-pay

## 2020-01-23 ENCOUNTER — Ambulatory Visit (INDEPENDENT_AMBULATORY_CARE_PROVIDER_SITE_OTHER): Payer: Medicare HMO | Admitting: Family Medicine

## 2020-01-23 ENCOUNTER — Ambulatory Visit: Payer: No Typology Code available for payment source | Attending: Medical | Admitting: Medical

## 2020-01-23 ENCOUNTER — Encounter (INDEPENDENT_AMBULATORY_CARE_PROVIDER_SITE_OTHER): Payer: Self-pay | Admitting: Medical

## 2020-01-23 ENCOUNTER — Encounter (INDEPENDENT_AMBULATORY_CARE_PROVIDER_SITE_OTHER): Payer: Self-pay | Admitting: Family Medicine

## 2020-01-23 VITALS — BP 113/70 | HR 71 | Temp 97.9°F | Ht 65.0 in | Wt 266.0 lb

## 2020-01-23 VITALS — BP 158/72 | HR 64 | Ht 71.0 in | Wt 259.6 lb

## 2020-01-23 DIAGNOSIS — E1169 Type 2 diabetes mellitus with other specified complication: Secondary | ICD-10-CM | POA: Diagnosis not present

## 2020-01-23 DIAGNOSIS — Z6841 Body Mass Index (BMI) 40.0 and over, adult: Secondary | ICD-10-CM | POA: Diagnosis not present

## 2020-01-23 DIAGNOSIS — R609 Edema, unspecified: Secondary | ICD-10-CM

## 2020-01-23 DIAGNOSIS — I4892 Unspecified atrial flutter: Secondary | ICD-10-CM | POA: Insufficient documentation

## 2020-01-23 DIAGNOSIS — E782 Mixed hyperlipidemia: Secondary | ICD-10-CM

## 2020-01-23 DIAGNOSIS — I480 Paroxysmal atrial fibrillation: Secondary | ICD-10-CM

## 2020-01-23 DIAGNOSIS — I10 Essential (primary) hypertension: Secondary | ICD-10-CM | POA: Insufficient documentation

## 2020-01-23 DIAGNOSIS — I4460 Unspecified fascicular block: Secondary | ICD-10-CM | POA: Insufficient documentation

## 2020-01-23 MED ORDER — TRULICITY 1.5 MG/0.5ML ~~LOC~~ SOAJ: 1.5000 mg | SUBCUTANEOUS | 0 refills | Status: DC

## 2020-01-23 NOTE — Progress Notes (Signed)
Chief Complaint:   OBESITY Lucile is here to discuss his progress with his obesity treatment plan along with follow-up of his obesity related diagnoses. Lavon is on the Category 1 Plan and states he is following his eating plan approximately 50% of the time. Zaccai states he is exercising for 0 minutes 0 times per week.  Today's visit was #: 6 Starting weight: 283 lbs Starting date: 10/12/2019 Today's weight: 266 lbs Today's date: 01/23/2020 Total lbs lost to date: 17 lbs Total lbs lost since last in-office visit: 9 lbs Total weight loss percentage to date: -6.01%  Interim History: Linell says he is eating about 2 meals per day.  He sometimes takes his Lasix in the morning and skips it in the afternoon.  His edema is improving.  Assessment/Plan:   1. Edema Improving with Lasix use. Will check CMP to monitor creatinine and electrolytes.  2. Type 2 diabetes mellitus with other specified complication, without long-term current use of insulin ([place]) He reports being out of Trulicity. Intensive lifestyle modification including diet, exercise and weight loss are the first line of treatment for diabetes.  Will refill Trulicity, as per below.  - Dulaglutide (TRULICITY) 1.5 HE/1.7EY SOPN; Inject 0.5 mLs (1.5 mg total) into the skin once a week.  Dispense: 6 mL; Refill: 0  3. Class 3 severe obesity with serious comorbidity and body mass index (BMI) of 40.0 to 44.9 in adult, unspecified obesity type [facility]) Harvin is currently in the action stage of change. As such, his goal is to continue with weight loss efforts. He has agreed to the Category 1 Plan and keeping a food journal and adhering to recommended goals of 1000 calories and 75 grams of protein.   Exercise goals: Older adults should follow the adult guidelines. When older adults cannot meet the adult guidelines, they should be as physically active as their abilities and conditions will allow.  Older adults should do exercises that  maintain or improve balance if they are at risk of falling.   Behavioral modification strategies: increasing lean protein intake.  Wynter has agreed to follow-up with our clinic in 2 weeks. He was informed of the importance of frequent follow-up visits to maximize his success with intensive lifestyle modifications for his multiple health conditions.   Objective:   Blood pressure 113/70, pulse 71, temperature 97.9 F (36.6 C), temperature source Oral, height 5\' 5"  (1.651 m), weight 266 lb (120.7 kg), SpO2 91 %. Body mass index is 44.26 kg/m.  General: Cooperative, alert, well developed, in no acute distress. HEENT: Conjunctivae and lids unremarkable. Cardiovascular: Regular rhythm.  Lungs: Normal work of breathing. Neurologic: No focal deficits.   Lab Results  Component Value Date   CREATININE 1.70 (H) 01/02/2020   BUN 21 01/02/2020   NA 140 01/02/2020   K 4.1 01/02/2020   CL 100 01/02/2020   CO2 32 01/02/2020   Lab Results  Component Value Date   ALT 11 01/02/2020   AST 12 (L) 01/02/2020   ALKPHOS 95 01/02/2020   BILITOT 0.8 01/02/2020   Lab Results  Component Value Date   HGBA1C 8.1 (H) 10/12/2019   HGBA1C 7.5 (H) 07/05/2019   HGBA1C 5.9 (H) 01/04/2019   HGBA1C 6.2 09/15/2017   HGBA1C 6.3 03/12/2017   Lab Results  Component Value Date   INSULIN 19.4 10/12/2019   Lab Results  Component Value Date   TSH 3.050 10/12/2019   Lab Results  Component Value Date   CHOL 109 10/12/2019  HDL 30 (L) 10/12/2019   LDLCALC 50 10/12/2019   LDLDIRECT 65.9 09/13/2007   TRIG 173 (H) 10/12/2019   CHOLHDL 3.6 10/12/2019   Lab Results  Component Value Date   WBC 11.7 (H) 01/02/2020   HGB 15.2 01/02/2020   HCT 47.8 01/02/2020   MCV 91.7 01/02/2020   PLT 195 01/02/2020   Lab Results  Component Value Date   IRON 86 10/12/2019   TIBC 297 10/12/2019   FERRITIN 80 10/12/2019   Obesity Behavioral Intervention:   Approximately 15 minutes were spent on the discussion  below.  ASK: We discussed the diagnosis of obesity with [name] today and Ulyess agreed to give [name] permission to discuss obesity behavioral modification therapy today.  ASSESS: Treyven has the diagnosis of obesity and his BMI today is 44.3. Rodrigues is in the action stage of change.   ADVISE: Joevanni was educated on the multiple health risks of obesity as well as the benefit of weight loss to improve his health. He was advised of the need for long term treatment and the importance of lifestyle modifications to improve his current health and to decrease his risk of future health problems.  AGREE: Multiple dietary modification options and treatment options were discussed and TRUE agreed to follow the recommendations documented in the above note.  ARRANGE: Taje was educated on the importance of frequent visits to treat obesity as outlined per CMS and USPSTF guidelines and agreed to schedule his next follow up appointment today.  Attestation Statements:   Reviewed by clinician on day of visit: allergies, medications, problem list, medical history, surgical history, family history, social history, and previous encounter notes.  I, Water quality scientist, CMA, am acting as transcriptionist for Briscoe Deutscher, DO  I have reviewed the above documentation for accuracy and completeness, and I agree with the above. Briscoe Deutscher, DO

## 2020-01-23 NOTE — Progress Notes (Cosign Needed)
[facility]  CARDIOLOGY, Sharlyne Cai  [address]  [place] [address]  [phone]    Thomas [name] is a 74 y.o. male seen in the office on 01/23/2020    Chief Complaint   Patient presents with   . Follow Up 6 Months     atrial flutter         Patient Active Problem List   Diagnosis   . Atrial flutter (CMS HCC)   . Body mass index (BMI) of 33.0 to 33.9 in adult   . Chest tightness   . Essential hypertension   . Hyperlipidemia   . Left bundle branch hemiblock   . Paroxysmal atrial flutter (CMS HCC)   . History of cardioversion   . History of cardiac cath-Overall normal coronary anatomy 12/26/2018       History of Present Illness: This is 74 year old male patient with a past medical history of paroxysmal atrial flutter/fib, hypertension, hyperlipidemia, gout, and chronic shortness of breath-patient is to see pulmonology in this regard. He was seen one time byDr. Kristen Cardinal on05/08/2019secondary to atrial flutter.Holter monitor, echocardiogram, and stress test werecompleted in the past. Patient was seen at our office to establish care.  He was complaining of chest pain that radiated into his left arm and palpitations.  Nuclear stress test and Holter monitor was ordered.  Patient underwent nuclear stress test which returned abnormal.  Holter monitor was never completed.  Cardiac catheterization was completed 12/26/2018 which revealed overall normal coronary anatomy with an estimated ejection fraction of 55%.  Dr. Francie Massing recommended TEE/DC cardioversion as he was in atrial fibrillation/atrial flutter.  TEE was completed revealing an EF of 55-60%.  No evidence of thrombus was identified.  The patient was successfully cardioverted to normal sinus rhythm and initiated on flecainide.  When the patient presented for follow-up following TEE/DC cardioversion he was back in atrial fibrillation.  The patient wished to continue with rate control and anticoagulation.  His  flecainide was discontinued.  Since then, he did convert back to normal sinus and his flecainide was re-initiated.  He has continued to go in and out of atrial fibrillation/flutter since then.    He presents to the office today for routine follow-up.  He presents with several questions.  He has a list of his most recent blood pressure values.  Patient tells me he has been taking his medications as directed.  He may have a delay within 1-2 hours dosing.  He has been obtaining blood pressure values prior to his morning meds .  Systolic readings have been elevated.  There are some diastolic elevations as well.  He tells me he remains active.  Reports occasional chest discomfort which seems to occur when his blood pressures are elevated.  He denies shortness of breath, palpitations, dizziness, syncope or near-syncope.  He remains compliant with Eliquis 5 mg twice daily with regard to his PAF/flutter.  Patient reports no bleeding with use.  His blood pressure is slightly elevated upon arrival at 158/72.  He denies use of NSAIDs.  Reports limited use of dietary salt.        Current Outpatient Medications   Medication Sig   . acetaminophen (TYLENOL) 500 mg Oral Tablet Take 500 mg by mouth Every 4 hours as needed for Pain (pt takes tylenol pm)   . ANORO ELLIPTA 62.5-25 mcg/actuation Inhalation Disk with Device INHALE ONE PUFF BY MOUTH DAILY   . apixaban (ELIQUIS) 5 mg Oral Tablet Take 5 mg by mouth   .  dilTIAZem (CARDIZEM CD) 240 mg Oral Capsule, Sust. Release 24 hr Take 1 Capsule (240 mg total) by mouth Once a day   . flecainide (TAMBOCOR) 100 mg Oral Tablet TAKE ONE TABLET BY MOUTH TWO TIMES A DAY   . furosemide (LASIX) 40 mg Oral Tablet Take 1 Tablet (40 mg total) by mouth Once a day   . lisinopriL (PRINIVIL) 40 mg Oral Tablet Take 1 Tablet (40 mg total) by mouth Once a day   . melatonin 5 mg Oral Tablet Take 5 mg by mouth Every night   . nitroGLYCERIN (NITROSTAT) 0.4 mg Sublingual Tablet, Sublingual 1 Tab (0.4 mg  total) by Sublingual route Every 5 minutes as needed for Chest pain   . potassium chloride (KLOR-CON) 10 mEq Oral Tablet Sustained Release TAKE ONE TABLET BY MOUTH DAILY   . simvastatin (ZOCOR) 20 mg Oral Tablet TAKE 1 TABLET BY MOUTH EVERY EVENING   . VITAMIN E ORAL Take 800 mg by mouth       No Known Allergies  Family Medical History:     Problem Relation (Age of Onset)    Cancer Mother    Hypertension (High Blood Pressure) Mother, Father            Social History     Socioeconomic History   . Marital status: Widowed     Spouse name: Not on file   . Number of children: Not on file   . Years of education: Not on file   . Highest education level: Not on file   Tobacco Use   . Smoking status: Former Smoker     Years: 60.00     Types: Cigarettes, Cigars   . Smokeless tobacco: Current User     Types: Chew, Snuff   Vaping Use   . Vaping Use: Never used   Substance and Sexual Activity   . Alcohol use: Yes   . Drug use: Never   . Sexual activity: Not Currently     Social Determinants of Health     Financial Resource Strain:    . Difficulty of Paying Living Expenses:    Food Insecurity:    . Worried About Programme researcher, broadcasting/film/video in the Last Year:    . Barista in the Last Year:    Transportation Needs:    . Freight forwarder (Medical):    Marland Kitchen Lack of Transportation (Non-Medical):    Physical Activity:    . Days of Exercise per Week:    . Minutes of Exercise per Session:    Stress:    . Feeling of Stress :    Intimate Partner Violence:    . Fear of Current or Ex-Partner:    . Emotionally Abused:    Marland Kitchen Physically Abused:    . Sexually Abused:          Physical Exam:  BP (!) 158/72 (Site: Left)   Pulse 64   Ht 1.803 m (5\' 11" )   Wt 118 kg (259 lb 9.6 oz)   SpO2 92%   BMI 36.21 kg/m       Body mass index is 36.21 kg/m.  Wt Readings from Last 5 Encounters:   01/23/20 118 kg (259 lb 9.6 oz)   09/29/19 118 kg (259 lb 8 oz)   08/11/19 118 kg (259 lb 3.2 oz)   03/24/19 118 kg (260 lb 8 oz)   03/17/19 116 kg (256 lb 2  oz)     The patient is  in no distress. Skin is warm and dry. There is no neck vein distention. No carotid bruits heard. The lungs are clear bilaterally. The heart is irregular without any significant murmur, gallop,rub,or click.  The abdomen is obese, normoactive, soft and non-tender. The aorta is not palpable. Extremities show no significant edema.  Distal pulses are intact.      PERTINENT DIAGNOSTICS: EKG today   23-Jan-2020 13:29:02 [facility] ROUTINE RETRIEVAL  Sinus bradycardia with premature ventricular complexes or fusion complexes  Pulmonary disease pattern  Left anterior fascicular block  Abnormal ECG  No previous ECGs available    Component 3 mo ago   TRIGLYCERIDES 64    CHOLESTEROL 153    HDL-CHOLESTEROL 54    LDL (CALCULATED) 86       Narrative    SEE CBC RESULT DATE 09/28/19 FOR SCAN        IMPRESSION:  1. Paroxysmal atrial flutter (CMS HCC)    2. Essential hypertension    3. Atrial flutter, unspecified type (CMS HCC)    4. Left bundle branch hemiblock    5. Mixed hyperlipidemia          PLAN:  Patient presents with continued blood pressure elevation.  He does admit to use of dietary salt and inconsistent dosing of his cardiac therapies.  Have had a meaningful discussion with him with regard to diet.  We have reviewed all medications and dosing instructions as well.  I have encouraged compliance with medication dosing.  Recommendations were made to discontinue dietary salt use alternative spices to enhance flavor.  Fortunately he is maintaining sinus rhythm.  He remains on Eliquis with no bleeding.  I have reviewed his medical therapies and at this point will make no adjustment.  Patient has been advised on steps to take should bleeding occur her with use of anticoagulant.  He will continue to monitor his blood pressure values with readings to our status post dosing of morning meds.  He will phone our office should his readings remain elevated at which point we may consider the use of  hydralazine.  I will have him back in 6 months for routine visit; sooner if needed He will phone with any questions or concerns.  Dr. Francie Massing was readily available for discussion regarding this patient's care.  Orders Placed This Encounter   . EKG - In Clinic, Same Day (93[address])

## 2020-01-24 LAB — COMPREHENSIVE METABOLIC PANEL
ALT: 15 IU/L (ref 0–44)
AST: 16 IU/L (ref 0–40)
Albumin/Globulin Ratio: 2.3 — ABNORMAL HIGH (ref 1.2–2.2)
Albumin: 4.1 g/dL (ref 3.7–4.7)
Alkaline Phosphatase: 103 IU/L (ref 48–121)
BUN/Creatinine Ratio: 11 (ref 10–24)
BUN: 16 mg/dL (ref 8–27)
Bilirubin Total: 0.7 mg/dL (ref 0.0–1.2)
CO2: 28 mmol/L (ref 20–29)
Calcium: 9.2 mg/dL (ref 8.6–10.2)
Chloride: 103 mmol/L (ref 96–106)
Creatinine, Ser: 1.43 mg/dL — ABNORMAL HIGH (ref 0.76–1.27)
GFR calc Af Amer: 55 mL/min/{1.73_m2} — ABNORMAL LOW (ref >59)
GFR calc non Af Amer: 48 mL/min/{1.73_m2} — ABNORMAL LOW (ref >59)
Globulin, Total: 1.8 g/dL (ref 1.5–4.5)
Glucose: 87 mg/dL (ref 65–99)
Potassium: 4.6 mmol/L (ref 3.5–5.2)
Sodium: 143 mmol/L (ref 134–144)
Total Protein: 5.9 g/dL — ABNORMAL LOW (ref 6.0–8.5)

## 2020-01-30 ENCOUNTER — Encounter: Payer: Self-pay | Admitting: Family Medicine

## 2020-01-30 ENCOUNTER — Ambulatory Visit: Payer: Medicare HMO | Admitting: Family Medicine

## 2020-01-30 ENCOUNTER — Other Ambulatory Visit: Payer: Self-pay

## 2020-01-30 VITALS — BP 130/80 | HR 70 | Ht 65.0 in | Wt 274.0 lb

## 2020-01-30 DIAGNOSIS — Z6841 Body Mass Index (BMI) 40.0 and over, adult: Secondary | ICD-10-CM | POA: Diagnosis not present

## 2020-01-30 DIAGNOSIS — M1712 Unilateral primary osteoarthritis, left knee: Secondary | ICD-10-CM

## 2020-01-30 DIAGNOSIS — N1831 Chronic kidney disease, stage 3a: Secondary | ICD-10-CM | POA: Diagnosis not present

## 2020-01-30 NOTE — Assessment & Plan Note (Signed)
Patient given injection today, tolerated the procedure well, discussed icing regimen and home exercise, which activities to do which wants to avoid, patient has a chronic problem with exacerbation.  Patient is attempting to lose weight which I think will be beneficial also gives him the possibility for potential surgical intervention.  Increase activity were tolerated.  Patient continues to wear to use a cane but wants to avoid using the walker on a regular basis.  Follow-up again in  8 weeks

## 2020-01-30 NOTE — Progress Notes (Signed)
Tanner [name] [address] Tanner [name] Phone: [phone] Subjective:   I Tanner [name] am serving as a Education administrator for Dr. Hulan [name].  This visit occurred during the SARS-CoV-2 public health emergency.  Safety protocols were in place, including screening questions prior to the visit, additional usage of staff PPE, and extensive cleaning of exam room while observing appropriate contact time as indicated for disinfecting solutions.   I'm seeing this patient by the request  of:  Tanner Koch, MD  CC: Left knee pain follow-up  [id number]   12/05/2019 Patient is failed all conservative therapy at this time.  Patient does have an elevated BMI of 46.  Patient is attempting to lose weight and working with healthy weight and wellness but is still having difficulty with losing weight.  Patient feels like if he was able to get a replacement of the knee he would be able to lose weight potentially better.  Patient is motivated but also disheartened at the moment.  Feels that also of the lack of activity is causing him to feel more melancholy.  I will consider the possibility of referring to another orthopedic surgeon to discuss potential if surgical intervention is possible.  I will try to touch base and see what they think.  Follow-up in 8 weeks otherwise  Update 01/30/2020 Tanner [name] is a 73 y.o. male coming in with complaint of bilateral knee pain. Patient states he feels about the same.  No significant improvement.  Attempting to lose weight and patient does state that he is down 17 pounds.  Patient is wanting to lose enough weight so he would be a surgical candidate for the potential knee replacement.  Multiple other comorbidities will need to discuss with primary care provider.  Continues to have trouble with daily activities and can even wake him up at night     Past Medical History:  Diagnosis Date  . Acute kidney failure ([place])   .  Arthritis   . Back pain   . BPH (benign prostatic hypertrophy)   . Clostridium difficile infection   . Clotting disorder ([place])   . Depression   . Diabetes ([place]) 12/11/2016  . DVT (deep venous thrombosis) ([place])   . DVT of deep femoral vein, right ([place]) 06/29/2018  . Edema, lower extremity   . High cholesterol   . Hypertension   . Joint pain   . Low back pain potentially associated with spinal stenosis   . Neuromuscular disorder ([place])   . Neuropathy of lower extremity    bilateral  . OSA (obstructive sleep apnea)   . Osteoarthritis   . PE (pulmonary embolism)   . Ventral hernia    Past Surgical History:  Procedure Laterality Date  . EYE SURGERY    . HERNIA REPAIR  1999   Social History   Socioeconomic History  . Marital status: Widowed    Spouse name: Not on file  . Number of children: 2  . Years of education: Not on file  . Highest education level: Not on file  Occupational History  . Occupation: Retired  Tobacco Use  . Smoking status: Former Smoker    Packs/day: 2.00    Years: 33.00    Pack years: 66.00    Types: Cigarettes    Start date: 08/30/1968    Quit date: 07/02/2001    Years since quitting: 18.5  . Smokeless tobacco: Never Used  . Tobacco comment: quit 12 years ago  Vaping  Use  . Vaping Use: Never used  Substance and Sexual Activity  . Alcohol use: Yes    Comment: occasional  . Drug use: No  . Sexual activity: Not Currently  Other Topics Concern  . 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    Social Determinants of Health   Financial Resource Strain:   . Difficulty of Paying Living Expenses:   Food Insecurity:   . Worried About Charity fundraiser in the Last Year:   . Arboriculturist in the Last Year:   Transportation Needs:   . Film/video editor (Medical):   Marland Kitchen Lack of Transportation  (Non-Medical):   Physical Activity:   . Days of Exercise per Week:   . Minutes of Exercise per Session:   Stress:   . Feeling of Stress :   Social Connections:   . Frequency of Communication with Friends and Family:   . Frequency of Social Gatherings with Friends and Family:   . Attends Religious Services:   . Active Member of Clubs or Organizations:   . Attends Archivist Meetings:   Marland Kitchen Marital Status:    Allergies  Allergen Reactions  . Codeine Sulfate Itching and Nausea Only   Family History  Problem Relation Age of Onset  . Diabetes Mother   . Pneumonia Mother   . Alzheimer's disease Mother   . Hyperlipidemia Mother   . Thyroid disease Mother   . Depression Mother   . Other Father 7       Drowned on boating accident  . Colon cancer Neg Hx   . Colon polyps Neg Hx     Current Outpatient Medications (Endocrine & Metabolic):  Marland Kitchen  Dulaglutide (TRULICITY) 1.5 GY/1.8HU SOPN, Inject 0.5 mLs (1.5 mg total) into the skin once a week.  Current Outpatient Medications (Cardiovascular):  .  atenolol (TENORMIN) 25 MG tablet, Take 12.5 mg by mouth daily. Marland Kitchen  atorvastatin (LIPITOR) 80 MG tablet, Take 40 mg by mouth daily. .  furosemide (LASIX) 20 MG tablet, Take 2 tablets (40 mg total) by mouth 2 (two) times daily.   Current Outpatient Medications (Analgesics):  .  acetaminophen (TYLENOL) 500 MG tablet, Take 1,000 mg by mouth as needed.  Current Outpatient Medications (Hematological):  .  apixaban (ELIQUIS) 5 MG TABS tablet, Take 1 tablet (5 mg total) by mouth 2 (two) times daily.  Current Outpatient Medications (Other):  Marland Kitchen  buPROPion (WELLBUTRIN SR) 150 MG 12 hr tablet, Take 150 mg by mouth 2 (two) times daily. .  finasteride (PROSCAR) 5 MG tablet, Take 5 mg by mouth at bedtime. .  gabapentin (NEURONTIN) 300 MG capsule, 600 mg. Take 2 tablets three daily AND 300 MG AT BEDTIME .  ketoconazole (NIZORAL) 2 % shampoo, Apply topically daily. .  Menthol-Methyl Salicylate  (THERA-GESIC PLUS) CREA, Apply 1 application topically at bedtime. Apply to lower back .  Multiple Vitamins-Minerals (MULTIVITAMINS THER. W/MINERALS) TABS, Take 1 tablet by mouth daily. .  nortriptyline (PAMELOR) 10 MG capsule, 2 CAPSULES EVERY NIGHT AT BEDTIME .  potassium chloride (KLOR-CON) 10 MEQ tablet, Take 4 tablets (40 mEq total) by mouth 2 (two) times daily. Marland Kitchen  saccharomyces boulardii (FLORASTOR) 250 MG capsule, Take 1 capsule (250 mg total) by mouth 2 (two) times daily.  Reviewed prior external information including notes and imaging from  primary care provider As well as notes that were available from care everywhere and other healthcare systems.  Past medical history, social, surgical and family history all reviewed in electronic medical record.  No pertanent information unless stated regarding to the chief complaint.   Review of Systems:  No headache, visual changes, nausea, vomiting, diarrhea, constipation, dizziness, abdominal pain, skin rash, fevers, chills, night sweats, weight loss, swollen lymph nodes, , chest pain, shortness of breath, mood changes. POSITIVE muscle aches, body aches, joint swelling  Objective  Blood pressure 130/80, pulse 70, height 5\' 5"  (1.651 m), weight 274 lb (124.3 kg), SpO2 90 %.   General: No apparent distress alert and oriented x3 mood and affect normal, dressed appropriately.  Asymmetry of the face at baseline Respiratory: Patient's speak in full sentences and does not appear short of breath  Cardiovascular: 2+ lower extremity edema, mildly tender, no erythema  Gait antalgic favoring the left knee MSK:   Knee: Left valgus deformity noted. Large thigh to calf ratio.  Tender to palpation over medial and PF joint line.  ROM decreased lacking the last 5 degrees of extension in the last 10 degrees of flexion instability with valgus force.  painful patellar compression. Patellar glide with moderate crepitus. Patellar and quadriceps tendons  unremarkable. Hamstring and quadriceps strength is normal.  After informed written and verbal consent, patient was seated on exam table. Left knee was prepped with alcohol swab and utilizing anterolateral approach, patient's left knee space was injected with 4:1  marcaine 0.5%: Kenalog 40mg /dL. Patient tolerated the procedure well without immediate complications.   Impression and Recommendations:     The above documentation has been reviewed and is accurate and complete Tanner Pulley, DO       Note: This dictation was prepared with Dragon dictation along with smaller phrase technology. Any transcriptional errors that result from this process are unintentional.

## 2020-01-30 NOTE — Assessment & Plan Note (Signed)
Avoid antiinflammatories.   

## 2020-01-30 NOTE — Patient Instructions (Signed)
Hold on referral See me again in 8 weeks

## 2020-02-02 ENCOUNTER — Other Ambulatory Visit (INDEPENDENT_AMBULATORY_CARE_PROVIDER_SITE_OTHER): Payer: Self-pay | Admitting: Family

## 2020-02-07 ENCOUNTER — Telehealth (INDEPENDENT_AMBULATORY_CARE_PROVIDER_SITE_OTHER): Payer: Self-pay | Admitting: Medical

## 2020-02-07 NOTE — Nursing Note (Signed)
Called in with BP reading since his meds were adjusted for past 2 weeks they have bben in the 130's 70's for the most part he said he got just one reading and it was 150/90 and that was one of the first reading that he started taken  Reviewed with michele no changes were made at this time

## 2020-02-09 ENCOUNTER — Other Ambulatory Visit (INDEPENDENT_AMBULATORY_CARE_PROVIDER_SITE_OTHER): Payer: Self-pay | Admitting: Family

## 2020-02-12 ENCOUNTER — Telehealth: Payer: Self-pay | Admitting: Internal Medicine

## 2020-02-12 NOTE — Progress Notes (Signed)
°  Chronic Care Management   Note  02/12/2020 Name: ZAFAR DEBROSSE MRN: [id number] DOB: 1946-01-13  HARLIS CHAMPOUX is a 74 y.o. year old male who is a primary care patient of Hoyt Koch, MD. I reached out to Lizabeth Leyden by phone today in response to a referral sent by Mr. Dalton Molesworth Farish's PCP, Hoyt Koch, MD.   Mr. Appleby was given information about Chronic Care Management services today including:  1. CCM service includes personalized support from designated clinical staff supervised by his physician, including individualized plan of care and coordination with other care providers 2. 24/7 contact phone numbers for assistance for urgent and routine care needs. 3. Service will only be billed when office clinical staff spend 20 minutes or more in a month to coordinate care. 4. Only one practitioner may furnish and bill the service in a calendar month. 5. The patient may stop CCM services at any time (effective at the end of the month) by phone call to the office staff.   Patient agreed to services and verbal consent obtained.   Follow up plan:   Earney [place] Upstream Scheduler

## 2020-02-13 NOTE — Telephone Encounter (Signed)
Reviewed active medications.  Parameters for refill met per departmental refill protocol . Refill sent electronically to pharmacy.  Order forwarded to provider for [facility]

## 2020-02-13 NOTE — Telephone Encounter (Signed)
,,,  Reviewed active medications.  Parameters for refill met per departmental refill protocol . Refill sent electronically to pharmacy.  Order forwarded to provider for [facility]

## 2020-02-21 ENCOUNTER — Ambulatory Visit (INDEPENDENT_AMBULATORY_CARE_PROVIDER_SITE_OTHER): Payer: Medicare HMO | Admitting: Family Medicine

## 2020-02-21 ENCOUNTER — Other Ambulatory Visit: Payer: Self-pay

## 2020-02-21 ENCOUNTER — Encounter (INDEPENDENT_AMBULATORY_CARE_PROVIDER_SITE_OTHER): Payer: Self-pay | Admitting: Family Medicine

## 2020-02-21 VITALS — BP 144/75 | HR 85 | Temp 97.5°F | Ht 65.0 in | Wt 267.0 lb

## 2020-02-21 DIAGNOSIS — E1169 Type 2 diabetes mellitus with other specified complication: Secondary | ICD-10-CM

## 2020-02-21 DIAGNOSIS — Z6841 Body Mass Index (BMI) 40.0 and over, adult: Secondary | ICD-10-CM

## 2020-02-21 DIAGNOSIS — M1712 Unilateral primary osteoarthritis, left knee: Secondary | ICD-10-CM

## 2020-02-21 DIAGNOSIS — R262 Difficulty in walking, not elsewhere classified: Secondary | ICD-10-CM

## 2020-02-22 MED ORDER — OZEMPIC (0.25 OR 0.5 MG/DOSE) 2 MG/1.5ML ~~LOC~~ SOPN: 0.5000 mg | PEN_INJECTOR | SUBCUTANEOUS | 0 refills | Status: DC

## 2020-02-25 NOTE — Progress Notes (Signed)
Chief Complaint:   OBESITY Kingsley is here to discuss his progress with his obesity treatment plan along with follow-up of his obesity related diagnoses. Kemond is on the Category 1 Plan and states he is following his eating plan approximately 30% of the time. Brodan states he is trying to walk more, but having balance issues and worsening knee paiin.  Total lbs lost to date: 17  Interim History: Knee pain and balance are worsening. Reviewed note with Dr. Tamala Julian, Sports Medicine. Dale's motivation is declining with decline in mood. He does still try to make protein-focused meal choices.   Assessment/Plan:   1. Type 2 diabetes mellitus with other specified complication, without long-term current use of insulin (HCC) Trulicity costing $604 per month while Zaim is in the gap. We will see if Ozempic is more affordable. We can also see if samples are available with PCP.  - Semaglutide,0.25 or 0.5MG /DOS, (OZEMPIC, 0.25 OR 0.5 MG/DOSE,) 2 MG/1.5ML SOPN; Inject 0.5 mg into the skin once a week.  Dispense: 1.5 mL; Refill: 0  2. Primary osteoarthritis of left knee Worsening. BMI < 40 needed for surgery. This issue directly impacts care plan for optimization of BMI and metabolic health as it impacts the patient's ability to make lifestyle changes.  3. Trouble walking Due to knee OA. He uses a cane, but admits to near-falls. Consider PT.   4. Class 3 severe obesity with serious comorbidity and body mass index (BMI) of 40.0 to 44.9 in adult, unspecified obesity type [facility])  Khoury is currently in the action stage of change. As such, his goal is to continue with weight loss efforts. He has agreed to the Category 1 Plan.   Exercise goals: Older adults should follow the adult guidelines. When older adults cannot meet the adult guidelines, they should be as physically active as their abilities and conditions will allow.  I recommend that [name] start Physical Therapy.  Behavioral  modification strategies: increasing lean protein intake, decreasing simple carbohydrates, increasing vegetables and increasing water intake.  Acheron has agreed to follow-up with our clinic in 3 weeks. He was informed of the importance of frequent follow-up visits to maximize his success with intensive lifestyle modifications for his multiple health conditions.   Objective:   Blood pressure (!) 144/75, pulse 85, temperature (!) 97.5 F (36.4 C), temperature source Oral, height 5\' 5"  (1.651 m), weight 267 lb (121.1 kg), SpO2 90 %. Body mass index is 44.43 kg/m.  General: Cooperative, alert, well developed, in no acute distress. HEENT: Conjunctivae and lids unremarkable. Cardiovascular: Regular rhythm.  Lungs: Normal work of breathing. Neurologic: No focal deficits.   Lab Results  Component Value Date   CREATININE 1.43 (H) 01/23/2020   BUN 16 01/23/2020   NA 143 01/23/2020   K 4.6 01/23/2020   CL 103 01/23/2020   CO2 28 01/23/2020   Lab Results  Component Value Date   ALT 15 01/23/2020   AST 16 01/23/2020   ALKPHOS 103 01/23/2020   BILITOT 0.7 01/23/2020   Lab Results  Component Value Date   HGBA1C 8.1 (H) 10/12/2019   HGBA1C 7.5 (H) 07/05/2019   HGBA1C 5.9 (H) 01/04/2019   HGBA1C 6.2 09/15/2017   HGBA1C 6.3 03/12/2017   Lab Results  Component Value Date   INSULIN 19.4 10/12/2019   Lab Results  Component Value Date   TSH 3.050 10/12/2019   Lab Results  Component Value Date   CHOL 109 10/12/2019   HDL 30 (L) 10/12/2019  LDLCALC 50 10/12/2019   LDLDIRECT 65.9 09/13/2007   TRIG 173 (H) 10/12/2019   CHOLHDL 3.6 10/12/2019   Lab Results  Component Value Date   WBC 11.7 (H) 01/02/2020   HGB 15.2 01/02/2020   HCT 47.8 01/02/2020   MCV 91.7 01/02/2020   PLT 195 01/02/2020   Lab Results  Component Value Date   IRON 86 10/12/2019   TIBC 297 10/12/2019   FERRITIN 80 10/12/2019    Obesity Behavioral Intervention:   Approximately 15 minutes were spent on  the discussion below.  ASK: We discussed the diagnosis of obesity with [name] today and Michaelangelo agreed to give [name] permission to discuss obesity behavioral modification therapy today.  ASSESS: Nnaemeka has the diagnosis of obesity and his BMI today is 44.43. Othal is in the action stage of change.   ADVISE: Brynn was educated on the multiple health risks of obesity as well as the benefit of weight loss to improve his health. He was advised of the need for long term treatment and the importance of lifestyle modifications to improve his current health and to decrease his risk of future health problems.  AGREE: Multiple dietary modification options and treatment options were discussed and Jovann agreed to follow the recommendations documented in the above note.  ARRANGE: Larico was educated on the importance of frequent visits to treat obesity as outlined per CMS and USPSTF guidelines and agreed to schedule his next follow up appointment today.  Attestation Statements:   Reviewed by clinician on day of visit: allergies, medications, problem list, medical history, surgical history, family history, social history, and previous encounter notes.

## 2020-03-08 ENCOUNTER — Ambulatory Visit: Payer: Medicare HMO | Admitting: Family

## 2020-03-08 DIAGNOSIS — Z0289 Encounter for other administrative examinations: Secondary | ICD-10-CM

## 2020-03-13 ENCOUNTER — Ambulatory Visit (INDEPENDENT_AMBULATORY_CARE_PROVIDER_SITE_OTHER): Payer: Medicare HMO | Admitting: Family Medicine

## 2020-03-13 ENCOUNTER — Other Ambulatory Visit: Payer: Self-pay

## 2020-03-13 ENCOUNTER — Encounter (INDEPENDENT_AMBULATORY_CARE_PROVIDER_SITE_OTHER): Payer: Self-pay | Admitting: Family Medicine

## 2020-03-13 VITALS — BP 117/70 | HR 72 | Temp 97.6°F | Ht 65.0 in | Wt 251.0 lb

## 2020-03-13 DIAGNOSIS — Z6841 Body Mass Index (BMI) 40.0 and over, adult: Secondary | ICD-10-CM

## 2020-03-13 DIAGNOSIS — K0889 Other specified disorders of teeth and supporting structures: Secondary | ICD-10-CM | POA: Diagnosis not present

## 2020-03-13 DIAGNOSIS — E1169 Type 2 diabetes mellitus with other specified complication: Secondary | ICD-10-CM

## 2020-03-13 DIAGNOSIS — M1712 Unilateral primary osteoarthritis, left knee: Secondary | ICD-10-CM | POA: Diagnosis not present

## 2020-03-14 NOTE — Progress Notes (Signed)
Chief Complaint:   OBESITY Tanner [name] is here to discuss his progress with his obesity treatment plan along with follow-up of his obesity related diagnoses. Tanner [name] is on the Category 1 Plan and states he is following his eating plan approximately 50% of the time. [name] states he has increased his walking.  Today's visit was #: 7 Starting weight: 283 lbs Starting date: 10/12/2019 Today's weight: 251 lbs Today's date: 03/13/2020 Total lbs lost to date: 32 lbs Total lbs lost since last in-office visit: 16 lbs  Interim History: Tanner [name] had a recent dental infection and says he was doing less eating.  He has a history of C.diff x2 and is on amoxicillin now.  She also has a history of a fall onto his left knee.  Assessment/Plan:   1. Primary osteoarthritis of left knee Needs BMI of less than 40.  He had a recent fall. Will continue to monitor symptoms as they relate to his weight loss journey. This issue directly impacts care plan for optimization of BMI and metabolic health as it impacts the patient's ability to make lifestyle changes.  2. Type 2 diabetes mellitus with other specified complication, without long-term current use of insulin (HCC) Diabetes Mellitus: needs improvement. Medication: Ozempic. Issues reviewed with him: blood sugar goals, complications of diabetes mellitus, hypoglycemia prevention and treatment, exercise, nutrition, and carbohydrate counting.   Lab Results  Component Value Date   HGBA1C 8.1 (H) 10/12/2019   HGBA1C 7.5 (H) 07/05/2019   HGBA1C 5.9 (H) 01/04/2019   Lab Results  Component Value Date   LDLCALC 50 10/12/2019   CREATININE 1.43 (H) 01/23/2020   3. Dentalgia Recent infection caused difficulty eating due to pain. Will continue to monitor symptoms as they relate to his weight loss journey. He will continue to focus on protein-rich, low simple carbohydrate foods. We reviewed the importance of hydration, regular exercise for stress reduction, and  restorative sleep.   4. Class 3 severe obesity with serious comorbidity and body mass index (BMI) of 40.0 to 44.9 in adult, unspecified obesity type [facility])  Tanner [name] is currently in the action stage of change. As such, his goal is to continue with weight loss efforts. He has agreed to keeping a food journal and adhering to recommended goals of 1000 calories and 85 grams of protein. (DASH diet).   Exercise goals: As tolerated.  Behavioral modification strategies: increasing lean protein intake and increasing water intake.  Tanner [name] has agreed to follow-up with our clinic in 3 weeks. He was informed of the importance of frequent follow-up visits to maximize his success with intensive lifestyle modifications for his multiple health conditions.   Objective:   Blood pressure 117/70, pulse 72, temperature 97.6 F (36.4 C), temperature source Oral, height 5\' 5"  (1.651 m), weight 251 lb (113.9 kg), SpO2 92 %. Body mass index is 41.77 kg/m.  General: Cooperative, alert, well developed, in no acute distress. HEENT: Conjunctivae and lids unremarkable. Cardiovascular: Regular rhythm.  Lungs: Normal work of breathing. Neurologic: No focal deficits.   Lab Results  Component Value Date   CREATININE 1.43 (H) 01/23/2020   BUN 16 01/23/2020   NA 143 01/23/2020   K 4.6 01/23/2020   CL 103 01/23/2020   CO2 28 01/23/2020   Lab Results  Component Value Date   ALT 15 01/23/2020   AST 16 01/23/2020   ALKPHOS 103 01/23/2020   BILITOT 0.7 01/23/2020   Lab Results  Component Value Date   HGBA1C 8.1 (H) 10/12/2019  HGBA1C 7.5 (H) 07/05/2019   HGBA1C 5.9 (H) 01/04/2019   HGBA1C 6.2 09/15/2017   HGBA1C 6.3 03/12/2017   Lab Results  Component Value Date   INSULIN 19.4 10/12/2019   Lab Results  Component Value Date   TSH 3.050 10/12/2019   Lab Results  Component Value Date   CHOL 109 10/12/2019   HDL 30 (L) 10/12/2019   LDLCALC 50 10/12/2019   LDLDIRECT 65.9 09/13/2007   TRIG 173 (H)  10/12/2019   CHOLHDL 3.6 10/12/2019   Lab Results  Component Value Date   WBC 11.7 (H) 01/02/2020   HGB 15.2 01/02/2020   HCT 47.8 01/02/2020   MCV 91.7 01/02/2020   PLT 195 01/02/2020   Lab Results  Component Value Date   IRON 86 10/12/2019   TIBC 297 10/12/2019   FERRITIN 80 10/12/2019   Obesity Behavioral Intervention:   Approximately 15 minutes were spent on the discussion below.  ASK: We discussed the diagnosis of obesity with Tanner [name] today and Tanner [name] agreed to give [name] permission to discuss obesity behavioral modification therapy today.  ASSESS: [name] has the diagnosis of obesity and his BMI today is 41.9. [name] is in the action stage of change.   ADVISE: Tanner [name] was educated on the multiple health risks of obesity as well as the benefit of weight loss to improve his health. He was advised of the need for long term treatment and the importance of lifestyle modifications to improve his current health and to decrease his risk of future health problems.  AGREE: Multiple dietary modification options and treatment options were discussed and Tanner [name] agreed to follow the recommendations documented in the above note.  ARRANGE: Tanner [name] was educated on the importance of frequent visits to treat obesity as outlined per CMS and USPSTF guidelines and agreed to schedule his next follow up appointment today.  Attestation Statements:   Reviewed by clinician on day of visit: allergies, medications, problem list, medical history, surgical history, family history, social history, and previous encounter notes.  I, Water quality scientist, CMA, am acting as transcriptionist for Tanner Deutscher, DO  I have reviewed the above documentation for accuracy and completeness, and I agree with the above. Tanner Deutscher, DO

## 2020-03-27 ENCOUNTER — Encounter: Payer: Self-pay | Admitting: Family Medicine

## 2020-03-27 ENCOUNTER — Ambulatory Visit: Payer: Medicare HMO | Admitting: Family Medicine

## 2020-03-27 ENCOUNTER — Other Ambulatory Visit: Payer: Self-pay

## 2020-03-27 DIAGNOSIS — M1712 Unilateral primary osteoarthritis, left knee: Secondary | ICD-10-CM | POA: Diagnosis not present

## 2020-03-27 DIAGNOSIS — G609 Hereditary and idiopathic neuropathy, unspecified: Secondary | ICD-10-CM

## 2020-03-27 NOTE — Patient Instructions (Signed)
Injected the knee Will check with Dr. Mayer Camel Get the labs  See me again in 8 weeks

## 2020-03-27 NOTE — Assessment & Plan Note (Signed)
History of peripheral neuropathy.  Patient is on gabapentin.  History of diabetes.  We will need to monitor.

## 2020-03-27 NOTE — Assessment & Plan Note (Signed)
Patient continues to lose weight at this time.  Has made significant strides at the moment.  I do believe that patient would be a candidate at about a BMI of 40.  I think I would like to refer patient to orthopedic surgery to discuss potential placement of the knee secondary to the history of falls as well as is a concern for a hip fracture when I think he would do very well with that knee replacement.  Otherwise can continue to do injections every 8 to 10 weeks   Body mass index is 41.77 kg/m.

## 2020-03-27 NOTE — Progress Notes (Signed)
Tanner [name]: [phone] Subjective:   [name], am serving as a scribe for Dr. Hulan Saas. This visit occurred during the SARS-CoV-2 public health emergency.  Safety protocols were in place, including screening questions prior to the visit, additional usage of staff PPE, and extensive cleaning of exam room while observing appropriate contact time as indicated for disinfecting solutions.   I'm seeing this patient by the request  of:  Hoyt Koch, MD  CC: Knee pain follow-up  RFF:[id number]   12/05/2019 Patient is failed all conservative therapy at this time.  Patient does have an elevated BMI of 46.  Patient is attempting to lose weight and working with healthy weight and wellness but is still having difficulty with losing weight.  Patient feels like if he was able to get a replacement of the knee he would be able to lose weight potentially better.  Patient is motivated but also disheartened at the moment.  Feels that also of the lack of activity is causing him to feel more melancholy.  I will consider the possibility of referring to another orthopedic surgeon to discuss potential if surgical intervention is possible.  I will try to touch base and see what they think.  Follow-up in 8 weeks otherwise  Appears to be bilateral.  Right greater than left.  Knee brace given and home exercises.  Discussed the gabapentin.  Follow-up again 4 to 8 weeks worsening pain consider potential injection.  01/30/2020 Patient given injection today, tolerated the procedure well, discussed icing regimen and home exercise, which activities to do which wants to avoid, patient has a chronic problem with exacerbation.  Patient is attempting to lose weight which I think will be beneficial also gives him the possibility for potential surgical intervention.  Increase activity were tolerated.  Patient continues to wear to use a cane  but wants to avoid using the walker on a regular basis.  Follow-up again in  8 weeks    Update 03/27/2020 Tanner [name] is a 74 y.o. male coming in with complaint of left knee pain and bilateral carpal tunnel. Patient continues to have stiffness and tingling in both hands.  Patient states that his balance is off and he has fallen twice since we last saw him. Pain in knees is constant.   Patient also has lower back pain that is constant. Patient has had epidural injections. Is not able to get epidural injection due to blood thinner.       Past Medical History:  Diagnosis Date  . Acute kidney failure ([place])   . Arthritis   . Back pain   . BPH (benign prostatic hypertrophy)   . Clostridium difficile infection   . Clotting disorder ([place])   . Depression   . Diabetes ([place]) 12/11/2016  . DVT (deep venous thrombosis) ([place])   . DVT of deep femoral vein, right ([place]) 06/29/2018  . Edema, lower extremity   . High cholesterol   . Hypertension   . Joint pain   . Low back pain potentially associated with spinal stenosis   . Neuromuscular disorder ([place])   . Neuropathy of lower extremity    bilateral  . OSA (obstructive sleep apnea)   . Osteoarthritis   . PE (pulmonary embolism)   . Ventral hernia    Past Surgical History:  Procedure Laterality Date  . EYE SURGERY    . [place]   Social History  Socioeconomic History  . Marital status: Widowed    Spouse name: Not on file  . Number of children: 2  . Years of education: Not on file  . Highest education level: Not on file  Occupational History  . Occupation: Retired  Tobacco Use  . Smoking status: Former Smoker    Packs/day: 2.00    Years: 33.00    Pack years: 66.00    Types: Cigarettes    Start date: 08/30/1968    Quit date: 07/02/2001    Years since quitting: 18.7  . Smokeless tobacco: Never Used  . Tobacco comment: quit 12 years ago  Vaping Use  . Vaping Use: Never used  Substance and Sexual Activity  .  Alcohol use: Yes    Comment: occasional  . Drug use: No  . Sexual activity: Not Currently  Other Topics Concern  . 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    Social Determinants of Health   Financial Resource Strain:   . Difficulty of Paying Living Expenses: Not on file  Food Insecurity:   . Worried About Charity fundraiser in the Last Year: Not on file  . Ran Out of Food in the Last Year: Not on file  Transportation Needs:   . Lack of Transportation (Medical): Not on file  . Lack of Transportation (Non-Medical): Not on file  Physical Activity:   . Days of Exercise per Week: Not on file  . Minutes of Exercise per Session: Not on file  Stress:   . Feeling of Stress : Not on file  Social Connections:   . Frequency of Communication with Friends and Family: Not on file  . Frequency of Social Gatherings with Friends and Family: Not on file  . Attends Religious Services: Not on file  . Active Member of Clubs or Organizations: Not on file  . Attends Archivist Meetings: Not on file  . Marital Status: Not on file   Allergies  Allergen Reactions  . Codeine Sulfate Itching and Nausea Only   Family History  Problem Relation Age of Onset  . Diabetes Mother   . Pneumonia Mother   . Alzheimer's disease Mother   . Hyperlipidemia Mother   . Thyroid disease Mother   . Depression Mother   . Other Father 32       Drowned on boating accident  . Colon cancer Neg Hx   . Colon polyps Neg Hx     Current Outpatient Medications (Endocrine & Metabolic):  .  Semaglutide,0.25 or 0.5MG /DOS, (OZEMPIC, 0.25 OR 0.5 MG/DOSE,) 2 MG/1.5ML SOPN, Inject 0.5 mg into the skin once a week.  Current Outpatient Medications (Cardiovascular):  .  atenolol (TENORMIN) 25 MG tablet, Take 12.5 mg by mouth daily. Marland Kitchen  atorvastatin (LIPITOR) 80 MG  tablet, Take 40 mg by mouth daily. .  furosemide (LASIX) 20 MG tablet, Take 2 tablets (40 mg total) by mouth 2 (two) times daily.   Current Outpatient Medications (Analgesics):  .  acetaminophen (TYLENOL) 500 MG tablet, Take 1,000 mg by mouth as needed.  Current Outpatient Medications (Hematological):  .  apixaban (ELIQUIS) 5 MG TABS tablet, Take 1 tablet (5 mg total) by mouth 2 (two) times daily.  Current Outpatient Medications (Other):  Marland Kitchen  buPROPion [facility] SR) 150  MG 12 hr tablet, Take 150 mg by mouth 2 (two) times daily. .  finasteride (PROSCAR) 5 MG tablet, Take 5 mg by mouth at bedtime. .  gabapentin (NEURONTIN) 300 MG capsule, 600 mg. Take 2 tablets three daily AND 300 MG AT BEDTIME .  ketoconazole (NIZORAL) 2 % shampoo, Apply topically daily. .  Menthol-Methyl Salicylate (THERA-GESIC PLUS) CREA, Apply 1 application topically at bedtime. Apply to lower back .  Multiple Vitamins-Minerals (MULTIVITAMINS THER. W/MINERALS) TABS, Take 1 tablet by mouth daily. .  nortriptyline (PAMELOR) 10 MG capsule, 2 CAPSULES EVERY NIGHT AT BEDTIME .  potassium chloride (KLOR-CON) 10 MEQ tablet, Take 4 tablets (40 mEq total) by mouth 2 (two) times daily. Marland Kitchen  saccharomyces boulardii (FLORASTOR) 250 MG capsule, Take 1 capsule (250 mg total) by mouth 2 (two) times daily.   Reviewed prior external information including notes and imaging from  primary care provider As well as notes that were available from care everywhere and other healthcare systems.  Past medical history, social, surgical and family history all reviewed in electronic medical record.  No pertanent information unless stated regarding to the chief complaint.   Review of Systems:  No headache, visual changes, nausea, vomiting, diarrhea, constipation, dizziness, abdominal pain, skin rash, fevers, chills, night sweats, weight loss, swollen lymph nodes,  chest pain, shortness of breath, mood changes. POSITIVE muscle aches, body aches, joint  swelling  Objective  Blood pressure 122/74, pulse 65, height 5\' 5"  (1.651 m), weight 257 lb (116.6 kg), SpO2 98 %.   General: No apparent distress alert and oriented x3 mood and affect normal, dressed appropriately.  Asymmetry of the face noted but patient's baseline HEENT: Pupils equal, extraocular movements intact  Respiratory: Patient's speak in full sentences and does not appear short of breath  Cardiovascular: 1+ lower extremity edema, non tender, no erythema  Knee: Left valgus deformity noted. Large thigh to calf ratio.  Tender to palpation over medial and PF joint line.  ROM mild decrease in extension of 5 degrees in flexion to 10 degrees instability with valgus force.  painful patellar compression. Patellar glide with moderate crepitus. Patellar and quadriceps tendons unremarkable. Hamstring and quadriceps strength is normal. Antalgic gait walking with the aid of a cane  After informed written and verbal consent, patient was seated on exam table. Left knee was prepped with alcohol swab and utilizing anterolateral approach, patient's left knee space was injected with 4:1  marcaine 0.5%: Kenalog 40mg /dL. Patient tolerated the procedure well without immediate complications.    Impression and Recommendations:     The above documentation has been reviewed and is accurate and complete Lyndal Pulley, DO

## 2020-03-28 NOTE — Progress Notes (Signed)
Called patient to let him know that he can call [facility] to schedule. Provided patient with phone number.

## 2020-03-28 NOTE — Progress Notes (Signed)
BMI of 41 is great, have him come in to talk about surgery thank you!

## 2020-03-28 NOTE — Progress Notes (Signed)
It is great that his BMI is going down to 41.  Happy to see him and talk about surgery

## 2020-04-01 ENCOUNTER — Ambulatory Visit (INDEPENDENT_AMBULATORY_CARE_PROVIDER_SITE_OTHER): Payer: No Typology Code available for payment source | Admitting: Family Medicine

## 2020-04-01 ENCOUNTER — Encounter (INDEPENDENT_AMBULATORY_CARE_PROVIDER_SITE_OTHER): Payer: Self-pay | Admitting: Family Medicine

## 2020-04-01 ENCOUNTER — Other Ambulatory Visit: Payer: Self-pay

## 2020-04-01 ENCOUNTER — Other Ambulatory Visit (INDEPENDENT_AMBULATORY_CARE_PROVIDER_SITE_OTHER): Payer: Self-pay | Admitting: Family Medicine

## 2020-04-01 VITALS — BP 140/100 | HR 63 | Ht 71.0 in | Wt 267.0 lb

## 2020-04-01 DIAGNOSIS — E1169 Type 2 diabetes mellitus with other specified complication: Secondary | ICD-10-CM

## 2020-04-01 DIAGNOSIS — M5441 Lumbago with sciatica, right side: Secondary | ICD-10-CM

## 2020-04-01 DIAGNOSIS — M7989 Other specified soft tissue disorders: Secondary | ICD-10-CM

## 2020-04-01 DIAGNOSIS — J449 Chronic obstructive pulmonary disease, unspecified: Secondary | ICD-10-CM

## 2020-04-01 DIAGNOSIS — E785 Hyperlipidemia, unspecified: Secondary | ICD-10-CM

## 2020-04-01 DIAGNOSIS — F17220 Nicotine dependence, chewing tobacco, uncomplicated: Secondary | ICD-10-CM

## 2020-04-01 DIAGNOSIS — I10 Essential (primary) hypertension: Secondary | ICD-10-CM

## 2020-04-01 DIAGNOSIS — I4892 Unspecified atrial flutter: Secondary | ICD-10-CM

## 2020-04-01 NOTE — Progress Notes (Signed)
Thomas [name] is a 74 y.o. male who presents today for a six-month follow-up of hypertension and hyperlipidemia and chronic medical problems.  The patient reports current adherence to recommended diet is good. The pt is compliant with medicine.  Regarding HTN: The following symptoms are also present: none, and he also denies the following symptoms: headache, blurred vision, chest discomfort, shortness of breath and palpitations.  Side effects of medication:none.    Regarding lipid treatment: He is taking Zocor and is having no side effects.  Most recent labs were from 09/28/2019 revealing a total cholesterol 153, triglycerides of 64, HDL 54 and LDL only 86.   He does have a history of atrial flutter and has follow-up with cardiology.  Last office visit was 01/23/2020.  At that appointment his blood pressure was elevated.  They recommended he follow a low-salt diet and did not adjust any of his medications.  They stated they would consider using hydralazine if his blood pressures remained elevated.  He continues on his Eliquis as his anticoagulant and it seems to be working well.  He is on a calcium channel blocker for rate control. He denies having any bleeding.  He denies any palpitations, lightheadedness, dizziness or chest pain.    He is having swelling in the left lower leg. No injury.  No calf pain.  He has no history of a blood clot.  He has been taking Lasix mg in the morning, but not later in the day.Marland Kitchen  He is complaining of low back pain to the right of midline going down the right leg.  No weakness or numbness.  He has not had any imaging studies done yet.  He did not have any injury.  The patient does have a history of COPD.  He was diagnosed by PFTs with moderate COPD.   He continues to take the Anoro on a regular basis.  He states that he is not having to use his rescue inhaler very often.  He denies any pulmonary symptoms today.    REVIEW OF SYSTEMS:  GENERAL: negative for fevers/chills, fatigue,  significant weight change, sleep disturbance  HEENT:denies visual changes, sore throat or URI symptoms  RESPIRATORY:shortness of breath as above, no cough, or wheeze  CARDIAC:  Chest pains and shortness of breath as above  GI: no nausea/vomitting/diarrhea, no abdominal pain  MUSCULOSKELETAL: no myalgias or arthragias, no recent trauma, no edema   NEUROLOGIC: no headache, visual changes or mental status changes  GU: no urinary symptoms or CVA tenderness      No Known Allergies  Current Outpatient Medications   Medication Sig    acetaminophen (TYLENOL) 500 mg Oral Tablet Take 500 mg by mouth Every 4 hours as needed for Pain (pt takes tylenol pm)    ANORO ELLIPTA 62.5-25 mcg/actuation Inhalation Disk with Device INHALE ONE PUFF BY MOUTH DAILY    apixaban (ELIQUIS) 5 mg Oral Tablet Take 5 mg by mouth    dilTIAZem (CARDIZEM CD) 240 mg Oral Capsule, Sust. Release 24 hr TAKE 1 CAPSULE BY MOUTH ONCE A DAY    flecainide (TAMBOCOR) 100 mg Oral Tablet TAKE ONE TABLET BY MOUTH TWO TIMES A DAY    furosemide (LASIX) 40 mg Oral Tablet Take 1 Tablet (40 mg total) by mouth Once a day    lisinopriL (PRINIVIL) 40 mg Oral Tablet Take 1 Tablet (40 mg total) by mouth Once a day    melatonin 5 mg Oral Tablet Take 5 mg by mouth Every night  nitroGLYCERIN (NITROSTAT) 0.4 mg Sublingual Tablet, Sublingual 1 Tab (0.4 mg total) by Sublingual route Every 5 minutes as needed for Chest pain    potassium chloride (KLOR-CON) 10 mEq Oral Tablet Sustained Release TAKE ONE TABLET BY MOUTH DAILY    simvastatin (ZOCOR) 20 mg Oral Tablet TAKE 1 TABLET BY MOUTH EVERY EVENING    VITAMIN E ORAL Take 800 mg by mouth     Past Medical History:   Diagnosis Date    Arthritis     Dysuria     Enteritis due to Rotavirus     Gout     Headache     Hemorrhoid     Hypercholesterolemia     Hypertension          Past Surgical History:   Procedure Laterality Date    HX HEMORRHOIDECTOMY           Social History     Tobacco Use    Smoking status:  Former Smoker     Years: 60.00     Types: Cigarettes, Cigars    Smokeless tobacco: Current User     Types: Chew, Snuff   Substance Use Topics    Alcohol use: Yes        PHYSICAL EXAM:   The patient appears to be in no acute distress.  Vitals: BP (!) 140/100    Pulse 63    Ht 1.803 m (5\' 11" )    Wt 121 kg (267 lb)    SpO2 96%    BMI 37.24 kg/m   Respiratory: clear to ascultation B/L, no respiratory distress, equal BS throughout, no bibasilar rales  Heart: normal sinus rhythm today, no murmur, no edema, normal peripheral pulses  Abdomen: soft, nontender, positive bowel sounds  Extremities: no edema, no calf pain or tenderness  Neuro: AAOx3, cranial nerves's intact, DTR's 2/4 throughout  Skin: warm and dry, no rashes or lesions    ASSESSMENT:     ICD-10-CM    1. Paroxysmal atrial flutter (CMS HCC)  I48.92 Continue anticoagulation and rate controlling medications the following up with Cardiology.   2. Low back pain with right-sided sciatica  M54.41 PHYSICAL THERAPY OUTPATIENT EXTERNAL REFERRAL-refer for x-ray and physical therapy.     XR LUMBAR SPINE SERIES   3. Left leg swelling  M79.89 Peripheral Venous Duplex - Lower   4. Essential hypertension  I10 ALT (SGPT)-but lately elevated today.  He will continue to monitor this closely at home and watch the salt intake in his diet.     AST (SGOT)     BASIC METABOLIC PANEL, FASTING     LIPID PANEL     CBC/DIFF   5. Hyperlipidemia  E78.5 ALT (SGPT)-check fasting labs prior to next office visit.     AST (SGOT)     LIPID PANEL   6. COPD (chronic obstructive pulmonary disease) (CMS HCC)  J44.9 Continue maintenance inhalers.     PLAN:   I reviewed appropriate screenings and HCM with patient.  Immunizations are up-to-date.    Orders Placed This Encounter    XR LUMBAR SPINE SERIES    ALT (SGPT)    AST (SGOT)    BASIC METABOLIC PANEL, FASTING    LIPID PANEL    CBC/DIFF    PHYSICAL THERAPY OUTPATIENT EXTERNAL REFERRAL    Peripheral Venous Duplex - Lower     Medication:  continue current medication regimen unchanged  No follow-ups on file.

## 2020-04-01 NOTE — Nursing Note (Signed)
Follow up chronic medical conditions.

## 2020-04-01 NOTE — Nursing Note (Signed)
04/01/20 0900   Depression Screen   Little interest or pleasure in doing things. 0   Feeling down, depressed, or hopeless 0   PHQ 2 Total 0

## 2020-04-03 ENCOUNTER — Telehealth: Payer: Medicare HMO

## 2020-04-03 ENCOUNTER — Telehealth: Payer: Self-pay | Admitting: Pharmacist

## 2020-04-03 NOTE — Progress Notes (Signed)
Chronic Care Management Pharmacy Assistant   Name: Tanner [name]  MRN: [id number] DOB: 1946/04/22  Reason for Encounter: Initial Questions    PCP : Tanner Koch, MD  Allergies:   Allergies  Allergen Reactions  . Codeine Sulfate Itching and Nausea Only    Medications: Outpatient Encounter Medications as of 04/03/2020  Medication Sig  . acetaminophen (TYLENOL) 500 MG tablet Take 1,000 mg by mouth as needed.  Marland Kitchen apixaban (ELIQUIS) 5 MG TABS tablet Take 1 tablet (5 mg total) by mouth 2 (two) times daily.  Marland Kitchen atenolol (TENORMIN) 25 MG tablet Take 12.5 mg by mouth daily.  Marland Kitchen atorvastatin (LIPITOR) 80 MG tablet Take 40 mg by mouth daily.  Marland Kitchen buPROPion (WELLBUTRIN SR) 150 MG 12 hr tablet Take 150 mg by mouth 2 (two) times daily.  . finasteride (PROSCAR) 5 MG tablet Take 5 mg by mouth at bedtime.  . furosemide (LASIX) 20 MG tablet Take 2 tablets (40 mg total) by mouth 2 (two) times daily.  Marland Kitchen gabapentin (NEURONTIN) 300 MG capsule 600 mg. Take 2 tablets three daily AND 300 MG AT BEDTIME  . ketoconazole (NIZORAL) 2 % shampoo Apply topically daily.  . Menthol-Methyl Salicylate (THERA-GESIC PLUS) CREA Apply 1 application topically at bedtime. Apply to lower back  . Multiple Vitamins-Minerals (MULTIVITAMINS THER. W/MINERALS) TABS Take 1 tablet by mouth daily.  . nortriptyline (PAMELOR) 10 MG capsule 2 CAPSULES EVERY NIGHT AT BEDTIME  . potassium chloride (KLOR-CON) 10 MEQ tablet Take 4 tablets (40 mEq total) by mouth 2 (two) times daily.  Marland Kitchen saccharomyces boulardii (FLORASTOR) 250 MG capsule Take 1 capsule (250 mg total) by mouth 2 (two) times daily.  . Semaglutide,0.25 or 0.5MG /DOS, (OZEMPIC, 0.25 OR 0.5 MG/DOSE,) 2 MG/1.5ML SOPN Inject 0.5 mg into the skin once a week.   No facility-administered encounter medications on file as of 04/03/2020.    Current Diagnosis: Patient Active Problem List   Diagnosis Date Noted  . Chronic kidney disease (CKD), stage III (moderate) ([place])  12/13/2019  . Carpal tunnel syndrome 12/05/2019  . Coronary artery disease involving native coronary artery of native heart without angina pectoris 11/15/2019  . Elevated creatine kinase 11/15/2019  . Aortic atherosclerosis ([place]) 11/15/2019  . Recurrent Clostridium difficile diarrhea 05/28/2017  . Peripheral neuropathy 03/17/2017  . Diabetes ([place]) 12/11/2016  . Allergic rhinitis 12/11/2016  . Degenerative arthritis of left knee 01/30/2016  . Class 3 severe obesity with serious comorbidity and body mass index (BMI) of 45.0 to 49.9 in adult ([place]) 01/30/2016  . History of pulmonary embolus (PE) 12/19/2013  . Anxiety state 03/29/2007  . Major depressive disorder 03/29/2007  . OSA (obstructive sleep apnea) 03/29/2007  . Unspecified glaucoma 03/29/2007  . BPH (benign prostatic hyperplasia) 03/29/2007  . OA (osteoarthritis) of knee 03/29/2007  . Essential hypertension 03/29/2007  . History of Bell's palsy 03/29/2007    Goals Addressed   None     Follow-Up:  Pharmacist Review   Have you seen any other providers since your last visit? yes Any changes in your medications or health? Yes, The patient started  Ozempic 0.5mg  weekly. Any side effects from any medications? no Do you have an symptoms or problems not managed by your medications? no Any concerns about your health right now? Yes, Weight loss the patient has lost 32 pounds in the past 3 months.  Has your provider asked that you check blood pressure, blood sugar, or follow special diet at home? The patient has not been checking his blood  sugar he does not have a glucometer. The last time his checked his blood pressure it was 105/85.  Do you get any type of exercise on a regular basis? no Can you think of a goal you would like to reach for your health? The patient would like to be able to play golf but he is unable to due to his knee and having bilateral neuropathy in both hands.  Do you have any problems getting your medications? No,  The patient gets his medication from the [place] he is not paying anything for his medications.  Is there anything that you would like to discuss during the appointment? The patient would like to discuss possibly getting a freestyle Libra he does not want to stick his figure 3 times daily.  Please bring medications and supplements to appointment    Tanner [name], [facility]  Practice Team Manager/ CPA (Clinical Pharmacist Assistant) [phone]

## 2020-04-04 ENCOUNTER — Other Ambulatory Visit (INDEPENDENT_AMBULATORY_CARE_PROVIDER_SITE_OTHER): Payer: Self-pay | Admitting: Family Medicine

## 2020-04-04 ENCOUNTER — Ambulatory Visit (INDEPENDENT_AMBULATORY_CARE_PROVIDER_SITE_OTHER): Payer: Medicare HMO | Admitting: Family Medicine

## 2020-04-04 ENCOUNTER — Other Ambulatory Visit: Payer: Self-pay

## 2020-04-04 ENCOUNTER — Encounter (INDEPENDENT_AMBULATORY_CARE_PROVIDER_SITE_OTHER): Payer: Self-pay | Admitting: Family Medicine

## 2020-04-04 VITALS — BP 118/76 | HR 72 | Temp 97.6°F | Ht 65.0 in | Wt 249.0 lb

## 2020-04-04 DIAGNOSIS — M1712 Unilateral primary osteoarthritis, left knee: Secondary | ICD-10-CM

## 2020-04-04 DIAGNOSIS — R2689 Other abnormalities of gait and mobility: Secondary | ICD-10-CM | POA: Diagnosis not present

## 2020-04-04 DIAGNOSIS — E1169 Type 2 diabetes mellitus with other specified complication: Secondary | ICD-10-CM | POA: Diagnosis not present

## 2020-04-04 DIAGNOSIS — Z6841 Body Mass Index (BMI) 40.0 and over, adult: Secondary | ICD-10-CM | POA: Diagnosis not present

## 2020-04-05 ENCOUNTER — Ambulatory Visit (HOSPITAL_COMMUNITY): Payer: No Typology Code available for payment source

## 2020-04-05 ENCOUNTER — Ambulatory Visit
Admission: RE | Admit: 2020-04-05 | Discharge: 2020-04-05 | Disposition: A | Discharge status: Home / Self Care | Payer: No Typology Code available for payment source | Source: Ambulatory Visit | Attending: Family Medicine | Admitting: Family Medicine

## 2020-04-05 ENCOUNTER — Other Ambulatory Visit: Payer: Self-pay

## 2020-04-05 DIAGNOSIS — M5441 Lumbago with sciatica, right side: Secondary | ICD-10-CM | POA: Insufficient documentation

## 2020-04-05 DIAGNOSIS — I10 Essential (primary) hypertension: Secondary | ICD-10-CM | POA: Insufficient documentation

## 2020-04-05 DIAGNOSIS — E785 Hyperlipidemia, unspecified: Secondary | ICD-10-CM | POA: Insufficient documentation

## 2020-04-05 LAB — CBC WITH DIFF
BASOPHIL #: <0.1 10*3/uL (ref <=0.20)
BASOPHIL %: 1 %
EOSINOPHIL #: 0.29 10*3/uL (ref <=0.50)
EOSINOPHIL %: 4 %
HCT: 46.1 % (ref 38.9–52.0)
HGB: 15.2 g/dL (ref 13.4–17.5)
IMMATURE GRANULOCYTE #: <0.1 10*3/uL (ref <0.10)
IMMATURE GRANULOCYTE %: 0 % (ref 0–1)
LYMPHOCYTE #: 2.46 10*3/uL (ref 1.00–4.80)
LYMPHOCYTE %: 31 %
MCH: 31 pg (ref 26.0–32.0)
MCHC: 33 g/dL (ref 31.0–35.5)
MCV: 93.9 fL (ref 78.0–100.0)
MONOCYTE #: 0.58 10*3/uL (ref 0.20–1.10)
MONOCYTE %: 7 %
MPV: 10.2 fL (ref 8.7–12.5)
NEUTROPHIL #: 4.45 10*3/uL (ref 1.50–7.70)
NEUTROPHIL %: 57 %
PLATELETS: 200 10*3/uL (ref 150–400)
RBC: 4.91 10*6/uL (ref 4.50–6.10)
RDW-CV: 12.9 % (ref 11.5–15.5)
WBC: 7.8 10*3/uL (ref 3.7–11.0)

## 2020-04-05 LAB — LIPID PANEL
CHOL/HDL RATIO: 3.1
CHOLESTEROL: 164 mg/dL (ref 75–200)
HDL CHOL: 53 mg/dL (ref 27–58)
LDL CALC: 87 mg/dL (ref 0–99)
TRIGLYCERIDES: 120 mg/dL (ref 28–153)
VLDL CALC: 24 mg/dL (ref 0–50)

## 2020-04-05 LAB — ALT (SGPT): ALT (SGPT): 16 U/L (ref 0–55)

## 2020-04-05 LAB — BASIC METABOLIC PANEL, FASTING
ANION GAP: 8 mmol/L (ref 6–15)
BUN: 16 mg/dL (ref 7–21)
CALCIUM: 9.3 mg/dL (ref 8.0–10.6)
CHLORIDE: 101 mmol/L (ref 98–107)
CO2 TOTAL: 32 mmol/L (ref 21–32)
CREATININE: 0.99 mg/dL (ref 0.80–1.60)
ESTIMATED GFR: >60 mL/min/{1.73_m2}
GLUCOSE: 91 mg/dL (ref 70–100)
POTASSIUM: 3.9 mmol/L (ref 3.3–5.1)
SODIUM: 141 mmol/L (ref 136–146)

## 2020-04-05 LAB — AST (SGOT): AST (SGOT): 14 U/L (ref 5–34)

## 2020-04-10 NOTE — Progress Notes (Signed)
Chief Complaint:   OBESITY Tanner [name] is here to discuss his progress with his obesity treatment plan along with follow-up of his obesity related diagnoses.   Today's visit was #: 8 Starting weight: 283 lbs Starting date: 10/12/2019 Today's weight: 249 lbs Today's date: 04/04/2020 Total lbs lost to date: 34 lbs Body mass index is 41.44 kg/m.  Total weight loss percentage to date: -12.01%  Interim History: Tanner [name] reports that he has been having frequent falls.  He has been continuing to work on [employer].  Nutrition Plan:  Keeping a food journal and adhering to recommended goals of 1000 calories and 85 grams of protein (DASH diet) for 50% of the time.  Anti-obesity medications: Ozempic 0.5 mg subcutaneously weekly. Reported side effects: None. Hunger is well controlled controlled. Cravings are well controlled controlled.  Activity: None.  Assessment/Plan:   1. Primary osteoarthritis of left knee Needs BMI of less than 40.  He reports difficulty with balance and frequent falls.  Will continue to monitor symptoms as they relate to his weight loss journey. This issue directly impacts care plan for optimization of BMI and metabolic health as it impacts the patient's ability to make lifestyle changes.  2. Type 2 diabetes mellitus with other specified complication, without long-term current use of insulin (HCC) Diabetes Mellitus: needs improvement. Medication: Ozempic. Issues reviewed with him: blood sugar goals, complications of diabetes mellitus, hypoglycemia prevention and treatment, exercise, nutrition, and carbohydrate counting.   Lab Results  Component Value Date   HGBA1C 8.1 (H) 10/12/2019   HGBA1C 7.5 (H) 07/05/2019   HGBA1C 5.9 (H) 01/04/2019   Lab Results  Component Value Date   LDLCALC 50 10/12/2019   CREATININE 1.43 (H) 01/23/2020   3. Imbalance Tanner [name] has OA in his knees and is on high risk medications.  He is a high fall risk.  He reports 2 falls  within the past few months.  Plan:  Will refer to PT today for help with balance.  - Ambulatory referral to Physical Therapy  4. Class 3 severe obesity with serious comorbidity and body mass index (BMI) of 40.0 to 44.9 in adult, unspecified obesity type [facility])  Tanner [name] is currently in the action stage of change. As such, his goal is to continue with weight loss efforts.   Nutrition goals: He has agreed to practicing portion control and making smarter food choices, such as increasing vegetables and decreasing simple carbohydrates.   Exercise goals: Older adults should follow the adult guidelines. When older adults cannot meet the adult guidelines, they should be as physically active as their abilities and conditions will allow.  Older adults should do exercises that maintain or improve balance if they are at risk of falling.   Behavioral modification strategies: increasing lean protein intake, decreasing simple carbohydrates, increasing vegetables and increasing water intake.  Tanner [name] has agreed to follow-up with our clinic in 3 weeks. He was informed of the importance of frequent follow-up visits to maximize his success with intensive lifestyle modifications for his multiple health conditions.   Objective:   Blood pressure 118/76, pulse 72, temperature 97.6 F (36.4 C), height 5\' 5"  (1.651 m), weight 249 lb (112.9 kg), SpO2 95 %. Body mass index is 41.44 kg/m.  General: Cooperative, alert, well developed, in no acute distress. HEENT: Conjunctivae and lids unremarkable. Cardiovascular: Regular rhythm.  Lungs: Normal work of breathing. Neurologic: No focal deficits.  Alert and oriented.  Right eye ptosis - chronic (s/p eye surgery).  Smile asymmetry -  chronic.  Walks with a cane.  Poor distal LE sensation.  Right knee with abrasion with no signs of infection.  Lab Results  Component Value Date   CREATININE 1.43 (H) 01/23/2020   BUN 16 01/23/2020   NA 143 01/23/2020   K 4.6 01/23/2020    CL 103 01/23/2020   CO2 28 01/23/2020   Lab Results  Component Value Date   ALT 15 01/23/2020   AST 16 01/23/2020   ALKPHOS 103 01/23/2020   BILITOT 0.7 01/23/2020   Lab Results  Component Value Date   HGBA1C 8.1 (H) 10/12/2019   HGBA1C 7.5 (H) 07/05/2019   HGBA1C 5.9 (H) 01/04/2019   HGBA1C 6.2 09/15/2017   HGBA1C 6.3 03/12/2017   Lab Results  Component Value Date   INSULIN 19.4 10/12/2019   Lab Results  Component Value Date   TSH 3.050 10/12/2019   Lab Results  Component Value Date   CHOL 109 10/12/2019   HDL 30 (L) 10/12/2019   LDLCALC 50 10/12/2019   LDLDIRECT 65.9 09/13/2007   TRIG 173 (H) 10/12/2019   CHOLHDL 3.6 10/12/2019   Lab Results  Component Value Date   WBC 11.7 (H) 01/02/2020   HGB 15.2 01/02/2020   HCT 47.8 01/02/2020   MCV 91.7 01/02/2020   PLT 195 01/02/2020   Lab Results  Component Value Date   IRON 86 10/12/2019   TIBC 297 10/12/2019   FERRITIN 80 10/12/2019   Obesity Behavioral Intervention:   Approximately 15 minutes were spent on the discussion below.  ASK: We discussed the diagnosis of obesity with Tanner [name] today and Tanner [name] agreed to give [name] permission to discuss obesity behavioral modification therapy today.  ASSESS: Tanner [name] has the diagnosis of obesity and his BMI today is 41.4. Tanner [name] is in the action stage of change.   ADVISE: [place] was educated on the multiple health risks of obesity as well as the benefit of weight loss to improve his health. He was advised of the need for long term treatment and the importance of lifestyle modifications to improve his current health and to decrease his risk of future health problems.  AGREE: Multiple dietary modification options and treatment options were discussed and Tanner [name] agreed to follow the recommendations documented in the above note.  ARRANGE: Tanner [name] was educated on the importance of frequent visits to treat obesity as outlined per CMS and USPSTF guidelines and agreed to  schedule his next follow up appointment today.  Attestation Statements:   Reviewed by clinician on day of visit: allergies, medications, problem list, medical history, surgical history, family history, social history, and previous encounter notes.  I, Water quality scientist, CMA, am acting as transcriptionist for Briscoe Deutscher, DO  I have reviewed the above documentation for accuracy and completeness, and I agree with the above. Briscoe Deutscher, DO

## 2020-04-15 LAB — LIPID PANEL
Cholesterol: 85 (ref 0–200)
HDL: 33 — AB (ref 35–70)
LDL Cholesterol: 43
Triglycerides: 122 (ref 40–160)

## 2020-04-15 LAB — COMPREHENSIVE METABOLIC PANEL
Calcium: 8.9 (ref 8.7–10.7)
GFR calc non Af Amer: 48.5

## 2020-04-15 LAB — BASIC METABOLIC PANEL
CO2: 31 — AB (ref 13–22)
Chloride: 106 (ref 99–108)

## 2020-04-15 LAB — HEMOGLOBIN A1C: Hemoglobin A1C: 5.4

## 2020-04-15 LAB — HEPATIC FUNCTION PANEL
ALT: 24 (ref 10–40)
AST: 13 — AB (ref 14–40)

## 2020-04-15 LAB — VITAMIN D 25 HYDROXY (VIT D DEFICIENCY, FRACTURES): Vit D, 25-Hydroxy: 51

## 2020-04-18 ENCOUNTER — Other Ambulatory Visit (HOSPITAL_BASED_OUTPATIENT_CLINIC_OR_DEPARTMENT_OTHER): Payer: Self-pay

## 2020-04-18 DIAGNOSIS — M1712 Unilateral primary osteoarthritis, left knee: Secondary | ICD-10-CM | POA: Diagnosis not present

## 2020-05-01 ENCOUNTER — Other Ambulatory Visit: Payer: Self-pay

## 2020-05-01 ENCOUNTER — Ambulatory Visit: Payer: Medicare HMO | Admitting: Pharmacist

## 2020-05-01 DIAGNOSIS — E1169 Type 2 diabetes mellitus with other specified complication: Secondary | ICD-10-CM

## 2020-05-01 DIAGNOSIS — I2510 Atherosclerotic heart disease of native coronary artery without angina pectoris: Secondary | ICD-10-CM

## 2020-05-01 DIAGNOSIS — N400 Enlarged prostate without lower urinary tract symptoms: Secondary | ICD-10-CM

## 2020-05-01 DIAGNOSIS — I10 Essential (primary) hypertension: Secondary | ICD-10-CM

## 2020-05-01 NOTE — Chronic Care Management (AMB) (Signed)
Chronic Care Management Pharmacy  Name: Tanner [name]  MRN: [id number] DOB: Sep 14, 1945   Chief Complaint/ HPI  Lizabeth Leyden,  74 y.o. , male presents for their Initial CCM visit with the clinical pharmacist via telephone due to COVID-19 Pandemic.  PCP : Hoyt Koch, MD Patient Care Team: Hoyt Koch, MD as PCP - General (Internal Medicine) Charlton Haws, [facility] as Pharmacist (Pharmacist)  Their chronic conditions include: Hypertension, Diabetes, Coronary Artery Disease, Chronic Kidney Disease, Depression, Anxiety, Osteoarthritis, BPH and Allergic Rhinitis, Hx PE, peripheral neuropathy,   Brother is in hospital today.  Office Visits: 12/12/19 Dr Sharlet Salina OV: chronic f/u. Previously taken off metformin for rising creatinine.   Consult Visit: 04/04/20 Dr [name] (weight mgmt): 34 lb wt loss since 10/12/19. Referred to PT.  03/27/20 Dr Tamala Julian (sports med): knee injection given.  02/21/20 Dr [name] (weight mgmt): switched Trulicity to Ozempic due to donut hole.   01/02/20 Dr Marin Olp (heme/onc): f/u for hx recurrent PE (06/2017)  12/05/19 Dr [name] (weight mgmt): stopped HCTZ d/t elevated Cr, rx'd furosemide, KCl and increased Trulicity to 1.5 mg.   Allergies  Allergen Reactions  . Codeine Sulfate Itching and Nausea Only   Medications: Outpatient Encounter Medications as of 05/01/2020  Medication Sig  . acetaminophen (TYLENOL) 500 MG tablet Take 1,000 mg by mouth as needed.  Marland Kitchen apixaban (ELIQUIS) 5 MG TABS tablet Take 1 tablet (5 mg total) by mouth 2 (two) times daily.  Marland Kitchen atenolol (TENORMIN) 25 MG tablet Take 12.5 mg by mouth daily.  Marland Kitchen atorvastatin (LIPITOR) 80 MG tablet Take 40 mg by mouth daily.  Marland Kitchen buPROPion (WELLBUTRIN SR) 150 MG 12 hr tablet Take 150 mg by mouth 2 (two) times daily.  . finasteride (PROSCAR) 5 MG tablet Take 5 mg by mouth at bedtime.  . furosemide (LASIX) 20 MG tablet Take 2 tablets (40 mg total) by mouth 2 (two) times daily. (Patient  taking differently: Take 40 mg by mouth daily. )  . gabapentin (NEURONTIN) 300 MG capsule Take 2 tablets (600 mg) three times daily AND 300 MG AT BEDTIME  . ketoconazole (NIZORAL) 2 % shampoo Apply topically daily.  . Multiple Vitamins-Minerals (MULTIVITAMINS THER. W/MINERALS) TABS Take 1 tablet by mouth daily.  . nortriptyline (PAMELOR) 10 MG capsule 2 CAPSULES EVERY NIGHT AT BEDTIME  . OZEMPIC, 0.25 OR 0.5 MG/DOSE, 2 MG/1.5ML SOPN INJECT 0.$RemoveBefor'5MG'VofWWqLwtRNg$  UNDER THE SKIN EVERY WEEK  . potassium chloride (KLOR-CON) 10 MEQ tablet Take 4 tablets (40 mEq total) by mouth 2 (two) times daily. (Patient taking differently: Take 20 mEq by mouth daily. )  . saccharomyces boulardii (FLORASTOR) 250 MG capsule Take 1 capsule (250 mg total) by mouth 2 (two) times daily.  . Menthol-Methyl Salicylate (THERA-GESIC PLUS) CREA Apply 1 application topically at bedtime. Apply to lower back (Patient not taking: Reported on 05/01/2020)   No facility-administered encounter medications on file as of 05/01/2020.    Wt Readings from Last 3 Encounters:  04/04/20 249 lb (112.9 kg)  03/27/20 257 lb (116.6 kg)  03/13/20 251 lb (113.9 kg)    Current Diagnosis/Assessment:  SDOH Interventions     Most Recent Value  SDOH Interventions  Financial Strain Interventions Intervention Not Indicated      Goals Addressed            This Visit's Progress   . Pharmacy Care Plan       CARE PLAN ENTRY (see longitudinal plan of care for additional care plan information)  Current Barriers:  .  Chronic Disease Management support, education, and care coordination needs related to Hypertension, Hyperlipidemia, Diabetes, Coronary Artery Disease, and BPH   Hypertension BP Readings from Last 3 Encounters:  04/04/20 118/76  03/27/20 122/74  03/13/20 117/70 .  Pharmacist Clinical Goal(s): o Over the next 180 days, patient will work with PharmD and providers to maintain BP goal <130/80 . Current regimen:  o Atenolol 25 mg - 1/2 tab  daily o Furosemide 20 mg - 2 tab twice a day . Interventions: o Discussed BP goals and benefits of medications for prevention of heart attack / stroke o Discussed benefit of switching atenolol to lisinopril for kidney protection; [facility] . Patient self care activities - Over the next 180 days, patient will: o Check BP twice a week, document, and provide at future appointments o Ensure daily salt intake < 2300 mg/day o Discuss switching atenolol to lisinopril for kidney benefits with [facility] provider  Hyperlipidemia / CAD Lab Results  Component Value Date/Time   LDLCALC 50 10/12/2019 02:59 PM   LDLDIRECT 65.9 09/13/2007 10:28 AM .  Pharmacist Clinical Goal(s): o Over the next 180 days, patient will work with PharmD and providers to maintain LDL goal < 70 . Current regimen:  o Atorvastatin 80 mg - 1/2 tab daily . Interventions: o Discussed cholesterol goals and benefits of medications for prevention of heart attack / stroke o Discussed improvement in cholesterol after 30+ lb intentional weight loss this year . Patient self care activities - Over the next 180 days, patient will: o Continue medication as prescribed  Diabetes Lab Results  Component Value Date/Time   HGBA1C 8.1 (H) 10/12/2019 02:59 PM   HGBA1C 7.5 (H) 07/05/2019 11:23 AM .  Pharmacist Clinical Goal(s): o Over the next 180 days, patient will work with PharmD and providers to maintain A1c goal <7% . Current regimen:  o Ozempic 0.5 mg weekly . Interventions: o Discussed importance of maintaining sugars at goal to prevent complications of diabetes including kidney damage, retinal damage, and cardiovascular disease o Pt has achieved A1c < 6% (per [facility] labs) with weight loss and medication . Patient self care activities - Over the next 180 days, patient will: o Check blood sugar as needed if signs/symptoms of low blood sugar o Contact provider with any episodes of hypoglycemia  BPH . Pharmacist Clinical  Goal(s) o Over the next 180 days, patient will work with PharmD and providers to optimize therapy . Current regimen:  o Finasteride 5 mg daily . Interventions: o Discussed side effects of finasteride; pt may benefit from switching to better-tolerated agent like tamsulosin; pt sees [place] provider for this issue, advised to discuss at next visit . Patient self care activities - Over the next 180 days, patient will: o Discuss finasteride side effects at follow up with [facility] provider  Medication management . Pharmacist Clinical Goal(s): o Over the next 180 days, patient will work with PharmD and providers to maintain optimal medication adherence . Current pharmacy: [facility] / [place] mail order . Interventions o Comprehensive medication review performed. o Continue current medication management strategy . Patient self care activities - Over the next 180 days, patient will: o Focus on medication adherence by pil box o Take medications as prescribed o Report any questions or concerns to PharmD and/or provider(s)  Initial goal documentation       Hypertension   BP goal is:  <130/80  Office blood pressures are  BP Readings from Last 3 Encounters:  04/04/20 118/76  03/27/20 122/74  03/13/20 117/70   Lab Results  Component Value Date   CREATININE 1.43 (H) 01/23/2020   BUN 16 01/23/2020   GFR 48.19 (L) 12/12/2019   GFRNONAA 48 (L) 01/23/2020   GFRAA 55 (L) 01/23/2020   NA 143 01/23/2020   K 4.6 01/23/2020   CALCIUM 9.2 01/23/2020   CO2 28 01/23/2020   Patient checks BP at home when feeling symptomatic Patient home BP readings are ranging: 120s-130s/60s  Patient has failed these meds in the past: HCTZ Patient is currently controlled on the following medications:  . Atenolol 25 mg - 1/2 tab daily AM  . Furosemide 20 mg - 2 tab daily . Klor-Con 10 mEq - 2 tab daily  We discussed diet and exercise extensively; pt reports furosemide causes excessive urination; he does report swelling  in ankles; he takes potassium and furosemide less than prescribed but symptoms are manageable and potassium is normal per patient report (he had labs at [place] this month);   Pt may benefit from ACE/ARB for kidney protection given fluctuating kidney function (GFR 38-50 over past year); pt has been on atenolol for over 10 years, it does not have additional benefits and does have worse side effect profile; recommend switching atenolol to lisinopril 5 mg to optimize regimen - [facility] provider prescribes [facility]ol, pt plans to discuss at next follow up  Plan  Recommend switching atenolol to lisinopril 5 mg for additional kidney protection benefits - pt to discuss with [facility] provider  Hyperlipidemia / CAD   LDL goal < 70 Hx CAD  04/15/20 labs at [facility] (per patient report) Chol 86 TRIG 122 HDL 33 Non-HDL 52  Last lipids Lab Results  Component Value Date   CHOL 109 10/12/2019   HDL 30 (L) 10/12/2019   LDLCALC 50 10/12/2019   LDLDIRECT 65.9 09/13/2007   TRIG 173 (H) 10/12/2019   CHOLHDL 3.6 10/12/2019   Hepatic Function Latest Ref Rng & Units 01/23/2020 01/02/2020 12/12/2019  Total Protein 6.0 - 8.5 g/dL 5.9(L) 6.5 -  Albumin 3.7 - 4.7 g/dL 4.1 4.3 3.8  AST 0 - 40 IU/L 16 12(L) -  ALT 0 - 44 IU/L 15 11 -  Alk Phosphatase 48 - 121 IU/L 103 95 -  Total Bilirubin 0.0 - 1.2 mg/dL 0.7 0.8 -  Bilirubin, Direct 0.0 - 0.3 mg/dL - - -    The ASCVD Risk score Mikey Bussing DC Jr., et al., 2013) failed to calculate for the following reasons:   The valid total cholesterol range is 130 to 320 mg/dL   Patient has failed these meds in past: n/a Patient is currently controlled on the following medications:  . Atorvastatin 80 mg - 1/2 tab daily HS  We discussed:  diet and exercise extensively; Cholesterol goals; benefits of statin for ASCVD risk reduction; cholesterol has improved since pt has lost >30 lbs  Plan  Continue current medications and control with diet and exercise  Diabetes   A1c goal <7% A1c 5.4% Nov  1st at [facility] (per pt report)  Recent Relevant Labs: Lab Results  Component Value Date/Time   HGBA1C 8.1 (H) 10/12/2019 02:59 PM   HGBA1C 7.5 (H) 07/05/2019 11:23 AM   GFR 48.19 (L) 12/12/2019 02:43 PM   GFR 63.06 11/29/2017 01:32 PM    Last diabetic Eye exam: No results found for: HMDIABEYEEXA  Last diabetic Foot exam: No results found for: HMDIABFOOTEX   Checking BG: Never   Patient has failed these meds in past: Trulicity, metformin Patient is  currently controlled on the following medications: . Ozempic 0.5 mg weekly ($47/month)  We discussed: diet and exercise extensively and how to recognize and treat signs of hypoglycemia; excellent A1c control per patient report after >30 lbs wt loss; pt denies hypoglycemia; discussed BG monitoring - pt does not check BG at home, given A1c control and lack of hypoglycemia-causing agents, pt does not need to check BG regularly at home  Plan  Continue current medications and control with diet and exercise  Depression   Depression screen [facility] 2/9 10/12/2019 09/24/2016 04/19/2015  Decreased Interest 3 0 0  Down, Depressed, Hopeless 3 0 0  PHQ - 2 Score 6 0 0  Altered sleeping 0 - -  Tired, decreased energy 3 - -  Change in appetite 1 - -  Feeling bad or failure about yourself  1 - -  Trouble concentrating 1 - -  Moving slowly or fidgety/restless 0 - -  Suicidal thoughts 0 - -  PHQ-9 Score 12 - -  Difficult doing work/chores Not difficult at all - -   Patient has failed these meds in past: n/a Patient is currently controlled on the following medications:  . Bupropion SR 150 mg BID  We discussed: Patient is satisfied with current regimen and denies issues  Plan  Continue current medications  Pain   Peripheral neuropathy Degenerative osteoarthritis of knee  Patient has failed these meds in past: tramadol Patient is currently controlled on the following medications:  . Gabapentin 300 mg - 2 AM, 2 lunch, 2 PM, 1 HS . Nortriptyline 10  mg - 2 cap HS  Tylenol 500 mg PRN  We discussed:  Pt reports taking nortriptyline for neuropathy (not mood); Patient is satisfied with current regimen and denies issues  Plan  Continue current medications  Hx PE   Patient has failed these meds in past: n/a Patient is currently controlled on the following medications:  . Eliquis 5 mg BID  We discussed: lifelong indication for Eliquis; bleeding risk; pt endorses occasional bruising and nuisance bleeding; denies major bleeding  Plan  Continue current medications  BPH   PSA  Date Value Ref Range Status  09/13/2007 1.09 0.10 - 4.00 ng/mL Final  03/30/2006 1.36 0.10 - 4.00 ng/mL Final    Patient has failed these meds in past: tamsulosin? Patient is currently controlled on the following medications:  . Finasteride 5 mg HS  We discussed:  Pt reports side effects he attributes to finasteride - erectile dysfunction mainly; he has not discussed this with prescriber before; he thinks he has taken Flomax before; [facility] pres[facility] his BPH medications; advised he discuss optimizing his BPH treatment to minimize side effects  Plan  Continue current medications  Pt to discuss therapy options with [facility] provider  Health Maintenance   Patient is currently controlled on the following medications:  Marland Kitchen Multivitamin  . Ketoconazole 2% shampoo  We discussed:  Patient is satisfied with current regimen and denies issues   Plan  Continue current medications  Medication Management   Pt uses [facility] [place] pharmacy for all medications Uses pill box? Yes Pt endorses 100% compliance  We discussed: Current pharmacy is preferred with insurance plan and patient is satisfied with pharmacy services  Plan  Continue current medication management strategy   Follow up: 6 month phone visit  Charlene Brooke, PharmD, BCACP Clinical Pharmacist [facility] [phone]

## 2020-05-01 NOTE — Patient Instructions (Addendum)
Visit Information  Phone number for Pharmacist: [phone]  Thank you for meeting with me to discuss your medications! I look forward to working with you to achieve your health care goals. Below is a summary of what we talked about during the visit:  Goals Addressed            This Visit's Progress   . Pharmacy Care Plan       CARE PLAN ENTRY (see longitudinal plan of care for additional care plan information)  Current Barriers:  . Chronic Disease Management support, education, and care coordination needs related to Hypertension, Hyperlipidemia, Diabetes, Coronary Artery Disease, and BPH   Hypertension BP Readings from Last 3 Encounters:  04/04/20 118/76  03/27/20 122/74  03/13/20 117/70 .  Pharmacist Clinical Goal(s): o Over the next 180 days, patient will work with PharmD and providers to maintain BP goal <130/80 . Current regimen:  o Atenolol 25 mg - 1/2 tab daily o Furosemide 20 mg - 2 tab twice a day . Interventions: o Discussed BP goals and benefits of medications for prevention of heart attack / stroke o Discussed benefit of switching atenolol to lisinopril for kidney protection; [facility] . Patient self care activities - Over the next 180 days, patient will: o Check BP twice a week, document, and provide at future appointments o Ensure daily salt intake < 2300 mg/day o Discuss switching atenolol to lisinopril for kidney benefits with [facility] provider  Hyperlipidemia / CAD Lab Results  Component Value Date/Time   LDLCALC 50 10/12/2019 02:59 PM   LDLDIRECT 65.9 09/13/2007 10:28 AM .  Pharmacist Clinical Goal(s): o Over the next 180 days, patient will work with PharmD and providers to maintain LDL goal < 70 . Current regimen:  o Atorvastatin 80 mg - 1/2 tab daily . Interventions: o Discussed cholesterol goals and benefits of medications for prevention of heart attack / stroke o Discussed improvement in cholesterol after 30+ lb intentional weight  loss this year . Patient self care activities - Over the next 180 days, patient will: o Continue medication as prescribed  Diabetes Lab Results  Component Value Date/Time   HGBA1C 8.1 (H) 10/12/2019 02:59 PM   HGBA1C 7.5 (H) 07/05/2019 11:23 AM .  Pharmacist Clinical Goal(s): o Over the next 180 days, patient will work with PharmD and providers to maintain A1c goal <7% . Current regimen:  o Ozempic 0.5 mg weekly . Interventions: o Discussed importance of maintaining sugars at goal to prevent complications of diabetes including kidney damage, retinal damage, and cardiovascular disease o Pt has achieved A1c < 6% (per [facility] labs) with weight loss and medication . Patient self care activities - Over the next 180 days, patient will: o Check blood sugar as needed if signs/symptoms of low blood sugar o Contact provider with any episodes of hypoglycemia  BPH . Pharmacist Clinical Goal(s) o Over the next 180 days, patient will work with PharmD and providers to optimize therapy . Current regimen:  o Finasteride 5 mg daily . Interventions: o Discussed side effects of finasteride; pt may benefit from switching to better-tolerated agent like tamsulosin; pt sees [place] provider for this issue, advised to discuss at next visit . Patient self care activities - Over the next 180 days, patient will: o Discuss finasteride side effects at follow up with [facility] provider  Medication management . Pharmacist Clinical Goal(s): o Over the next 180 days, patient will work with PharmD and providers to maintain optimal medication adherence .  Current pharmacy: [facility] / [place] mail order . Interventions o Comprehensive medication review performed. o Continue current medication management strategy . Patient self care activities - Over the next 180 days, patient will: o Focus on medication adherence by pil box o Take medications as prescribed o Report any questions or concerns to PharmD and/or provider(s)  Initial goal  documentation      Tanner [name] was given information about Chronic Care Management services today including:  1. CCM service includes personalized support from designated clinical staff supervised by his physician, including individualized plan of care and coordination with other care providers 2. 24/7 contact phone numbers for assistance for urgent and routine care needs. 3. Standard insurance, coinsurance, copays and deductibles apply for chronic care management only during months in which we provide at least 20 minutes of these services. Most insurances cover these services at 100%, however patients may be responsible for any copay, coinsurance and/or deductible if applicable. This service may help you avoid the need for more expensive face-to-face services. 4. Only one practitioner may furnish and bill the service in a calendar month. 5. The patient may stop CCM services at any time (effective at the end of the month) by phone call to the office staff.  Patient agreed to services and verbal consent obtained.   The patient verbalized understanding of instructions, educational materials, and care plan provided today and agreed to receive a mailed copy of patient instructions, educational materials, and care plan.  Telephone follow up appointment with pharmacy team member scheduled for: 6 months  Charlene Brooke, PharmD, BCACP Clinical Pharmacist [facility] [phone]  Chronic Kidney Disease, Adult Chronic kidney disease (CKD) occurs when the kidneys become damaged slowly over a long period of time. The kidneys are a pair of organs that do many important jobs in the body, including:  Removing waste and extra fluid from the blood to make urine.  Making hormones that maintain the amount of fluid in tissues and blood vessels.  Maintaining the right amount of fluids and chemicals in the body. A small amount of kidney damage may not cause problems, but a large  amount of damage may make it hard or impossible for the kidneys to work the way they should. If steps are not taken to slow down kidney damage or to stop it from getting worse, the kidneys may stop working permanently (end-stage renal disease or ESRD). Most of the time, CKD does not go away, but it can often be controlled. People who have CKD are usually able to live normal lives. What are the causes? The most common causes of this condition are diabetes and high blood pressure (hypertension). Other causes include:  Heart and blood vessel (cardiovascular) disease.  Kidney diseases, such as: ? Glomerulonephritis. ? Interstitial nephritis. ? Polycystic kidney disease. ? Renal vascular disease.  Diseases that affect the immune system.  Genetic diseases.  Medicines that damage the kidneys, such as anti-inflammatory medicines.  Being around or being in contact with poisonous (toxic) substances.  A kidney or urinary infection that occurs again and again (recurs).  Vasculitis. This is swelling or inflammation of the blood vessels.  A problem with urine flow that may be caused by: ? Cancer. ? Having kidney stones more than one time. ? An enlarged prostate, in males. What increases the risk? You are more likely to develop this condition if you:  Are older than age 54.  Are male.  Are African-American, Hispanic, Asian, [place], or  American [place].  Are a current or former smoker.  Are obese.  Have a family history of kidney disease or failure.  Often take medicines that are damaging to the kidneys. What are the signs or symptoms? Symptoms of this condition include:  Swelling (edema) of the face, legs, ankles, or feet.  Tiredness (lethargy) and having less energy.  Nausea or vomiting.  Confusion or trouble concentrating.  Problems with urination, such as: ? Painful or burning feeling during urination. ? Decreased urine production. ? Frequent urination,  especially at night. ? Bloody urine.  Muscle twitches and cramps, especially in the legs.  Shortness of breath.  Weakness.  Loss of appetite.  Metallic taste in the mouth.  Trouble sleeping.  Dry, itchy skin.  A low blood count (anemia).  Pale lining of the eyelids and surface of the eye (conjunctiva). Symptoms develop slowly and may not be obvious until the kidney damage becomes severe. It is possible to have kidney disease for years without having any symptoms. How is this diagnosed? This condition may be diagnosed based on:  Blood tests.  Urine tests.  Imaging tests, such as an ultrasound or CT scan.  A test in which a sample of tissue is removed from the kidneys to be examined under a microscope (kidney biopsy). These test results will help your health care provider determine how serious the CKD is. How is this treated? There is no cure for most cases of this condition, but treatment usually relieves symptoms and prevents or slows the progression of the disease. Treatment may include:  Making diet changes, which may require you to avoid alcohol, salty foods (sodium), and foods that are high in potassium, calcium, and protein.  Medicines: ? To lower blood pressure. ? To control blood glucose. ? To relieve anemia. ? To relieve swelling. ? To protect your bones. ? To improve the balance of electrolytes in your blood.  Removing toxic waste from the body through types of dialysis, if the kidneys can no longer do their job (kidney failure).  Managing any other conditions that are causing your CKD or making it worse. Follow these instructions at home: Medicines  Take over-the-counter and prescription medicines only as told by your health care provider. The dose of some medicines that you take may need to be adjusted.  Do not take any new medicines unless approved by your health care provider. Many medicines can worsen your kidney damage.  Do not take any vitamin  and mineral supplements unless approved by your health care provider. Many nutritional supplements can worsen your kidney damage. General instructions  Follow your prescribed diet as told by your health care provider.  Do not use any products that contain nicotine or tobacco, such as cigarettes and e-cigarettes. If you need help quitting, ask your health care provider.  Monitor and track your blood pressure at home. Report changes in your blood pressure as told by your health care provider.  If you are being treated for diabetes, monitor and track your blood sugar (blood glucose) levels as told by your health care provider.  Maintain a healthy weight. If you need help with this, ask your health care provider.  Start or continue an exercise plan. Exercise at least 30 minutes a day, 5 days a week.  Keep your immunizations up to date as told by your health care provider.  Keep all follow-up visits as told by your health care provider. This is important. Where to find more information  American Association of  Kidney Patients: BombTimer.gl  National Kidney Foundation: www.kidney.[place]: https://mathis.com/  Life [facility]: www.lifeoptions.org and www.kidneyschool.org Contact a health care provider if:  Your symptoms get worse.  You develop new symptoms. Get help right away if:  You develop symptoms of ESRD, which include: ? Headaches. ? Numbness in the hands or feet. ? Easy bruising. ? Frequent hiccups. ? Chest pain. ? Shortness of breath. ? Lack of menstruation, in women.  You have a fever.  You have decreased urine production.  You have pain or bleeding when you urinate. Summary  Chronic kidney disease (CKD) occurs when the kidneys become damaged slowly over a long period of time.  The most common causes of this condition are diabetes and high blood pressure (hypertension).  There is no cure for most cases of this condition, but  treatment usually relieves symptoms and prevents or slows the progression of the disease. Treatment may include a combination of medicines and lifestyle changes. This information is not intended to replace advice given to you by your health care provider. Make sure you discuss any questions you have with your health care provider. Document Revised: 05/14/2017 Document Reviewed: 07/09/2016 Elsevier Patient Education  2020 [employer].

## 2020-05-02 ENCOUNTER — Ambulatory Visit (INDEPENDENT_AMBULATORY_CARE_PROVIDER_SITE_OTHER): Payer: Medicare HMO | Admitting: Family Medicine

## 2020-05-02 ENCOUNTER — Encounter (INDEPENDENT_AMBULATORY_CARE_PROVIDER_SITE_OTHER): Payer: Self-pay | Admitting: Family Medicine

## 2020-05-02 VITALS — BP 117/72 | Ht 65.0 in | Wt 250.0 lb

## 2020-05-02 DIAGNOSIS — R2689 Other abnormalities of gait and mobility: Secondary | ICD-10-CM | POA: Diagnosis not present

## 2020-05-02 DIAGNOSIS — M1712 Unilateral primary osteoarthritis, left knee: Secondary | ICD-10-CM

## 2020-05-02 DIAGNOSIS — Z6841 Body Mass Index (BMI) 40.0 and over, adult: Secondary | ICD-10-CM

## 2020-05-02 DIAGNOSIS — E1169 Type 2 diabetes mellitus with other specified complication: Secondary | ICD-10-CM | POA: Diagnosis not present

## 2020-05-07 ENCOUNTER — Other Ambulatory Visit (INDEPENDENT_AMBULATORY_CARE_PROVIDER_SITE_OTHER): Payer: Self-pay | Admitting: Family Medicine

## 2020-05-07 DIAGNOSIS — E1169 Type 2 diabetes mellitus with other specified complication: Secondary | ICD-10-CM

## 2020-05-07 NOTE — Telephone Encounter (Signed)
Dr Wallace pt °

## 2020-05-07 NOTE — Telephone Encounter (Signed)
Last seen by Dr. Wallace. 

## 2020-05-07 NOTE — Progress Notes (Signed)
Chief Complaint:   OBESITY Tanner [name] is here to discuss his progress with his obesity treatment plan along with follow-up of his obesity related diagnoses.   Today's visit was #: 9 Starting weight: 283 lbs Starting date: 10/12/2019 Today's weight: 250 lbs Today's date: 05/02/2020 Total lbs lost to date: 33 lbs Body mass index is 41.6 kg/m.  Total weight loss percentage to date: -11.66%  Interim History: Tanner [name] will start PT in December.  Nutrition Plan: the Category 1 Plan. Anti-obesity medications: Ozempic. Reported side effects: None. Hunger is well controlled controlled. Cravings are well controlled controlled.  Activity: Walking for 10 minutes 7 times per week.  Assessment/Plan:   1. Type 2 diabetes mellitus with other specified complication, without long-term current use of insulin ([place]) Diabetes Mellitus: well controlled.  New A1c went from 8.1 to 5.4.  Recent labs brought in by patient from [place]. Will abstract into chart. Medication: Ozempic.   Plan:  Issues reviewed with him: blood sugar goals, complications of diabetes mellitus, hypoglycemia prevention and treatment, exercise, and nutrition.  Lab Results  Component Value Date   HGBA1C 8.1 (H) 10/12/2019   HGBA1C 7.5 (H) 07/05/2019   HGBA1C 5.9 (H) 01/04/2019   Lab Results  Component Value Date   LDLCALC 50 10/12/2019   CREATININE 1.43 (H) 01/23/2020   2. Primary osteoarthritis of left knee Needs BMI of less than 40.Body mass index is 41.6 kg/m. He reports difficulty with balance and frequent falls.  He will be starting PT soon. We will continue to monitor symptoms as they relate to his weight loss journey.  3. Imbalance Will start PT in December.  No new falls.  4. Class 3 severe obesity with serious comorbidity and body mass index (BMI) of 40.0 to 44.9 in adult, unspecified obesity type Tanner [name])  Course: Tanner [name] is currently in the action stage of change. As such, his goal is to continue with weight loss  efforts.   Nutrition goals: He has agreed to the Category 1 Plan.   Exercise goals: Older adults should follow the adult guidelines. When older adults cannot meet the adult guidelines, they should be as physically active as their abilities and conditions will allow.  Older adults should do exercises that maintain or improve balance if they are at risk of falling.   Behavioral modification strategies: increasing lean protein intake, decreasing simple carbohydrates, increasing vegetables and increasing water intake.  Tanner [name] has agreed to follow-up with our clinic in 3 weeks. He was informed of the importance of frequent follow-up visits to maximize his success with intensive lifestyle modifications for his multiple health conditions.   Objective:   Blood pressure 117/72, height 5\' 5"  (1.651 m), weight 250 lb (113.4 kg). Body mass index is 41.6 kg/m.  General: Cooperative, alert, well developed, in no acute distress. HEENT: Conjunctivae and lids unremarkable. Cardiovascular: Regular rhythm.  Lungs: Normal work of breathing. Neurologic: No focal deficits.   Lab Results  Component Value Date   CREATININE 1.43 (H) 01/23/2020   BUN 16 01/23/2020   NA 143 01/23/2020   K 4.6 01/23/2020   CL 103 01/23/2020   CO2 28 01/23/2020   Lab Results  Component Value Date   ALT 15 01/23/2020   AST 16 01/23/2020   ALKPHOS 103 01/23/2020   BILITOT 0.7 01/23/2020   Lab Results  Component Value Date   HGBA1C 8.1 (H) 10/12/2019   HGBA1C 7.5 (H) 07/05/2019   HGBA1C 5.9 (H) 01/04/2019   HGBA1C 6.2 09/15/2017  HGBA1C 6.3 03/12/2017   Lab Results  Component Value Date   INSULIN 19.4 10/12/2019   Lab Results  Component Value Date   TSH 3.050 10/12/2019   Lab Results  Component Value Date   CHOL 109 10/12/2019   HDL 30 (L) 10/12/2019   LDLCALC 50 10/12/2019   LDLDIRECT 65.9 09/13/2007   TRIG 173 (H) 10/12/2019   CHOLHDL 3.6 10/12/2019   Lab Results  Component Value Date   WBC 11.7  (H) 01/02/2020   HGB 15.2 01/02/2020   HCT 47.8 01/02/2020   MCV 91.7 01/02/2020   PLT 195 01/02/2020   Lab Results  Component Value Date   IRON 86 10/12/2019   TIBC 297 10/12/2019   FERRITIN 80 10/12/2019   Obesity Behavioral Intervention:   Approximately 15 minutes were spent on the discussion below.  ASK: We discussed the diagnosis of obesity with Tanner [name] today and Tanner [name] agreed to give [name] permission to discuss obesity behavioral modification therapy today.  ASSESS: Tanner [name] has the diagnosis of obesity and his BMI today is 41.7. Tanner [name] is in the action stage of change.   ADVISE: Tanner [name] was educated on the multiple health risks of obesity as well as the benefit of weight loss to improve his health. He was advised of the need for long term treatment and the importance of lifestyle modifications to improve his current health and to decrease his risk of future health problems.  AGREE: Multiple dietary modification options and treatment options were discussed and Tanner [name] agreed to follow the recommendations documented in the above note.  ARRANGE: Tanner [name] was educated on the importance of frequent visits to treat obesity as outlined per CMS and USPSTF guidelines and agreed to schedule his next follow up appointment today.  Attestation Statements:   Reviewed by clinician on day of visit: allergies, medications, problem list, medical history, surgical history, family history, social history, and previous encounter notes.  I, Water quality scientist, CMA, am acting as transcriptionist for Briscoe Deutscher, DO  I have reviewed the above documentation for accuracy and completeness, and I agree with the above. Briscoe Deutscher, DO

## 2020-05-08 ENCOUNTER — Other Ambulatory Visit (INDEPENDENT_AMBULATORY_CARE_PROVIDER_SITE_OTHER): Payer: Self-pay | Admitting: Family Medicine

## 2020-05-08 DIAGNOSIS — E1169 Type 2 diabetes mellitus with other specified complication: Secondary | ICD-10-CM

## 2020-05-08 NOTE — Telephone Encounter (Signed)
With increase in patients A1c, do you want patient to continue this dose?

## 2020-05-09 ENCOUNTER — Other Ambulatory Visit (INDEPENDENT_AMBULATORY_CARE_PROVIDER_SITE_OTHER): Payer: Self-pay | Admitting: Family Medicine

## 2020-05-10 NOTE — Telephone Encounter (Signed)
Last Visit:04/01/2020     Upcoming appointments: 09/30/2020           Lynwood Dawley, MA  05/10/2020, 07:59

## 2020-05-15 ENCOUNTER — Encounter (INDEPENDENT_AMBULATORY_CARE_PROVIDER_SITE_OTHER): Payer: Self-pay | Admitting: Family Medicine

## 2020-05-15 DIAGNOSIS — E1169 Type 2 diabetes mellitus with other specified complication: Secondary | ICD-10-CM

## 2020-05-16 MED ORDER — OZEMPIC (0.25 OR 0.5 MG/DOSE) 2 MG/1.5ML ~~LOC~~ SOPN: PEN_INJECTOR | SUBCUTANEOUS | 0 refills | Status: DC

## 2020-05-22 ENCOUNTER — Ambulatory Visit: Payer: Medicare HMO | Admitting: Family Medicine

## 2020-05-22 ENCOUNTER — Other Ambulatory Visit: Payer: Self-pay

## 2020-05-22 ENCOUNTER — Encounter: Payer: Self-pay | Admitting: Family Medicine

## 2020-05-22 DIAGNOSIS — M1712 Unilateral primary osteoarthritis, left knee: Secondary | ICD-10-CM

## 2020-05-22 NOTE — Progress Notes (Signed)
[name] [place] Phone: [phone] Subjective:   Rito Ehrlich, am serving as a scribe for Dr. Hulan Saas. This visit occurred during the SARS-CoV-2 public health emergency.  Safety protocols were in place, including screening questions prior to the visit, additional usage of staff PPE, and extensive cleaning of exam room while observing appropriate contact time as indicated for disinfecting solutions.   I'm seeing this patient by the request  of:  Hoyt Koch, MD  CC: Knee pain follow-up  EQA:[id number]  MINOR IDEN is a 74 y.o. male coming in with complaint of Left knee pain   Last seen 03/27/2020 Patient continues to lose weight at this time.  Has made significant strides at the moment.  I do believe that patient would be a candidate at about a BMI of 40.  I think I would like to refer patient to orthopedic surgery to discuss potential placement of the knee secondary to the history of falls as well as is a concern for a hip fracture when I think he would do very well with that knee replacement.  Otherwise can continue to do injections every 8 to 10 weeks  Update 05/22/2020 Patient states that the Left knee has been hurting pretty bad worse when walking is okay while sitting.  Patient states that any type of walking causes more discomfort and pain no.  Patient states that still affecting daily activities.  Did see orthopedic surgery and discussed that they would still like to watch him lose about 15 pounds before surgery.  At this point patient would like another recommendation for surgeon to discuss.  Patient states continuous instability noted.     Past Medical History:  Diagnosis Date  . Acute kidney failure ([place])   . Arthritis   . Back pain   . BPH (benign prostatic hypertrophy)   . Clostridium difficile infection   . Clotting disorder ([place])   . Depression   . Diabetes ([place]) 12/11/2016  . DVT  (deep venous thrombosis) ([place])   . DVT of deep femoral vein, right ([place]) 06/29/2018  . Edema, lower extremity   . High cholesterol   . Hypertension   . Joint pain   . Low back pain potentially associated with spinal stenosis   . Neuromuscular disorder ([place])   . Neuropathy of lower extremity    bilateral  . OSA (obstructive sleep apnea)   . Osteoarthritis   . PE (pulmonary embolism)   . Ventral hernia    Past Surgical History:  Procedure Laterality Date  . EYE SURGERY    . HERNIA REPAIR  1999   Social History   Socioeconomic History  . Marital status: Widowed    Spouse name: Not on file  . Number of children: 2  . Years of education: Not on file  . Highest education level: Not on file  Occupational History  . Occupation: Retired  Tobacco Use  . Smoking status: Former Smoker    Packs/day: 2.00    Years: 33.00    Pack years: 66.00    Types: Cigarettes    Start date: 08/30/1968    Quit date: 07/02/2001    Years since quitting: 18.9  . Smokeless tobacco: Never Used  . Tobacco comment: quit 12 years ago  Vaping Use  . Vaping Use: Never used  Substance and Sexual Activity  . Alcohol use: Yes    Comment: occasional  . Drug use: No  . Sexual  activity: Not Currently  Other Topics Concern  . 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    Social Determinants of Health   Financial Resource Strain: Low Risk   . Difficulty of Paying Living Expenses: Not very hard  Food Insecurity:   . Worried About Charity fundraiser in the Last Year: Not on file  . Ran Out of Food in the Last Year: Not on file  Transportation Needs:   . Lack of Transportation (Medical): Not on file  . Lack of Transportation (Non-Medical): Not on file  Physical Activity:   . Days of Exercise per Week: Not on file  . Minutes of Exercise per Session:  Not on file  Stress:   . Feeling of Stress : Not on file  Social Connections:   . Frequency of Communication with Friends and Family: Not on file  . Frequency of Social Gatherings with Friends and Family: Not on file  . Attends Religious Services: Not on file  . Active Member of Clubs or Organizations: Not on file  . Attends Archivist Meetings: Not on file  . Marital Status: Not on file   Allergies  Allergen Reactions  . Codeine Sulfate Itching and Nausea Only   Family History  Problem Relation Age of Onset  . Diabetes Mother   . Pneumonia Mother   . Alzheimer's disease Mother   . Hyperlipidemia Mother   . Thyroid disease Mother   . Depression Mother   . Other Father 13       Drowned on boating accident  . Colon cancer Neg Hx   . Colon polyps Neg Hx     Current Outpatient Medications (Endocrine & Metabolic):  .  Semaglutide,0.25 or 0.5MG /DOS, (OZEMPIC, 0.25 OR 0.5 MG/DOSE,) 2 MG/1.5ML SOPN, INJECT 0.5MG  UNDER THE SKIN EVERY WEEK  Current Outpatient Medications (Cardiovascular):  .  atenolol (TENORMIN) 25 MG tablet, Take 12.5 mg by mouth daily. Marland Kitchen  atorvastatin (LIPITOR) 80 MG tablet, Take 40 mg by mouth daily. .  furosemide (LASIX) 20 MG tablet, Take 2 tablets (40 mg total) by mouth 2 (two) times daily. (Patient taking differently: Take 40 mg by mouth daily. )   Current Outpatient Medications (Analgesics):  .  acetaminophen (TYLENOL) 500 MG tablet, Take 1,000 mg by mouth as needed.  Current Outpatient Medications (Hematological):  .  apixaban (ELIQUIS) 5 MG TABS tablet, Take 1 tablet (5 mg total) by mouth 2 (two) times daily.  Current Outpatient Medications (Other):  Marland Kitchen  buPROPion (WELLBUTRIN SR) 150 MG 12 hr tablet, Take 150 mg by mouth 2 (two) times daily. .  finasteride (PROSCAR) 5 MG tablet, Take 5 mg by mouth at bedtime. .  gabapentin (NEURONTIN) 300 MG capsule, Take 2 tablets (600 mg) three times daily AND 300 MG AT BEDTIME .  ketoconazole (NIZORAL) 2 %  shampoo, Apply topically daily. .  Menthol-Methyl Salicylate (THERA-GESIC PLUS) CREA, Apply 1 application topically at bedtime. Apply to lower back .  Multiple Vitamins-Minerals (MULTIVITAMINS THER. W/MINERALS) TABS, Take 1 tablet by mouth daily. .  nortriptyline (PAMELOR) 10 MG capsule, 2 CAPSULES EVERY NIGHT AT BEDTIME .  potassium chloride (KLOR-CON) 10 MEQ tablet, Take 4 tablets (40 mEq total) by mouth 2 (two) times daily. (Patient taking differently: Take 20 mEq by mouth  daily. ) .  saccharomyces boulardii (FLORASTOR) 250 MG capsule, Take 1 capsule (250 mg total) by mouth 2 (two) times daily.   Reviewed prior external information including notes and imaging from  primary care provider As well as notes that were available from care everywhere and other healthcare systems.  Past medical history, social, surgical and family history all reviewed in electronic medical record.  No pertanent information unless stated regarding to the chief complaint.   Review of Systems:  No headache, visual changes, nausea, vomiting, diarrhea, constipation, dizziness, abdominal pain, skin rash, fevers, chills, night sweats, weight loss, swollen lymph nodes, body aches, joint swelling, chest pain, shortness of breath, mood changes. POSITIVE muscle aches  Objective  Blood pressure 120/70, pulse 75, height 5\' 5"  (1.651 m), weight 250 lb (113.4 kg), SpO2 91 %.   General: No apparent distress alert and oriented x3 mood and affect normal, dressed appropriately.  Overweight HEENT: Pupils equal, extraocular movements intact  Respiratory: Patient's speak in full sentences and does not appear short of breath  Cardiovascular: 2+ lower extremity edema, non tender, no erythema  Severely antalgic walking with the aid of a cane. Knee: Left valgus deformity noted. Large thigh to calf ratio.  Trace effusion noted Tender to palpation over medial and PF joint line.  ROM lacks last 5 degrees of extension in last 10 degrees of  flexion instability with valgus force.  painful patellar compression. Patellar glide with moderate crepitus. Patellar and quadriceps tendons unremarkable. Hamstring and quadriceps strength is normal.  After informed written and verbal consent, patient was seated on exam table. Left knee was prepped with alcohol swab and utilizing anterolateral approach, patient's left knee space was injected with 4:1  marcaine 0.5%: Kenalog 40mg /dL. Patient tolerated the procedure well without immediate complications.   Impression and Recommendations:     The above documentation has been reviewed and is accurate and complete Lyndal Pulley, DO

## 2020-05-22 NOTE — Assessment & Plan Note (Signed)
Patient given another steroid injection today.  Tolerated procedure well.  Discussed with patient that we can repeat injections every 8 to 10 weeks if necessary.  Patient is going to be referred to another orthopedic surgeon to discuss potential surgical intervention.  Has lost a good amount of weight and BMI is at 41.  Hopefully patient will continue to lose some weight.  We know that the holidays can be hard.  Follow-up with me again in 10 weeks

## 2020-05-22 NOTE — Patient Instructions (Addendum)
Good to see you  Left knee injection today Dr. Rush Farmer to discuss See me again in 8-10 weeks

## 2020-05-23 ENCOUNTER — Ambulatory Visit (INDEPENDENT_AMBULATORY_CARE_PROVIDER_SITE_OTHER): Payer: Medicare HMO | Admitting: Family Medicine

## 2020-05-23 ENCOUNTER — Other Ambulatory Visit: Payer: Self-pay

## 2020-05-23 ENCOUNTER — Encounter (INDEPENDENT_AMBULATORY_CARE_PROVIDER_SITE_OTHER): Payer: Self-pay | Admitting: Family Medicine

## 2020-05-23 VITALS — BP 120/66 | HR 81 | Temp 97.5°F | Ht 65.0 in | Wt 249.0 lb

## 2020-05-23 DIAGNOSIS — Z6841 Body Mass Index (BMI) 40.0 and over, adult: Secondary | ICD-10-CM

## 2020-05-23 DIAGNOSIS — E1169 Type 2 diabetes mellitus with other specified complication: Secondary | ICD-10-CM

## 2020-05-23 DIAGNOSIS — I152 Hypertension secondary to endocrine disorders: Secondary | ICD-10-CM | POA: Diagnosis not present

## 2020-05-23 DIAGNOSIS — E1159 Type 2 diabetes mellitus with other circulatory complications: Secondary | ICD-10-CM | POA: Diagnosis not present

## 2020-05-23 MED ORDER — OZEMPIC (0.25 OR 0.5 MG/DOSE) 2 MG/1.5ML ~~LOC~~ SOPN: PEN_INJECTOR | SUBCUTANEOUS | 0 refills | Status: DC

## 2020-05-23 NOTE — Progress Notes (Signed)
Chief Complaint:   OBESITY Tanner [name] is here to discuss his progress with his obesity treatment plan along with follow-up of his obesity related diagnoses. Tanner [name] is on the Category 1 Plan and states he is following his eating plan approximately 25% of the time. Tanner [name] states he is walking 20-30 minutes 7 times per week.  Today's visit was #: 10 Starting weight: 283 lbs Starting date: 10/12/2019 Today's weight: 249 lbs Today's date: 05/23/2020 Total lbs lost to date: 34 lbs Total lbs lost since last in-office visit: 1 lb Total weight loss percentage to date: -12.01%  Interim History: Tanner [name] states program is going well. He has no issues with the plan. He is only eating 4 oz of protein at lunch, but eats his "lunch" frozen meal for dinner every night.  Plan: Recommend patient get the 6 oz of protein from dinner in at some point during the day, if he continues to eat frozen meals for supper.  Assessment/Plan:   1. Type 2 diabetes mellitus with other specified complication, without long-term current use of insulin (HCC) Tanner [name] is tolerating Ozempic well with no side effects. His last A1c was 5.4 in 04/2020 at the [place]. Tanner [name] is requesting a refill of Ozempic.  Plan: Refill Ozempic at current dose for 1 month, as per below. Recommend home blood sugar monitoring as needed is he feels poorly. Continue current treatment plan, otherwise through PCP.  Refill- Semaglutide,0.25 or 0.5MG /DOS, (OZEMPIC, 0.25 OR 0.5 MG/DOSE,) 2 MG/1.5ML SOPN; INJECT 0.5MG  UNDER THE SKIN EVERY WEEK  Dispense: 1.5 mL; Refill: 0  2. Hypertension associated with type 2 diabetes mellitus ([place]) Tanner [name] has no new symptoms or concerns. He has a blood pressure monitor to check his BP but hasn't been doing so.  Plan: Tanner [name]'s blood pressure is at goal. Continue current medications per PCP/specialists. Home blood pressure monitoring recommended.  3. Class 3 severe obesity with serious comorbidity and body mass index  (BMI) of 40.0 to 44.9 in adult, unspecified obesity type Tanner [name]) Tanner [name] is currently in the action stage of change. As such, his goal is to continue with weight loss efforts. He has agreed to the Category 1 Plan.   Exercise goals: As is  Behavioral modification strategies: no skipping meals, keeping healthy foods in the home, holiday eating strategies  and celebration eating strategies.  Tanner [name] has agreed to follow-up with our clinic in 3-4 weeks with Dr. [name]. He was informed of the importance of frequent follow-up visits to maximize his success with intensive lifestyle modifications for his multiple health conditions.   Objective:   Blood pressure 120/66, pulse 81, temperature (!) 97.5 F (36.4 C), height 5\' 5"  (1.651 m), weight 249 lb (112.9 kg), SpO2 94 %. Body mass index is 41.44 kg/m.  General: Cooperative, alert, well developed, in no acute distress. HEENT: Conjunctivae and lids unremarkable. Cardiovascular: Regular rhythm.  Lungs: Normal work of breathing. Neurologic: No focal deficits.   Lab Results  Component Value Date   CREATININE 1.43 (H) 01/23/2020   BUN 16 01/23/2020   NA 143 01/23/2020   K 4.6 01/23/2020   CL 106 04/15/2020   CO2 31 (A) 04/15/2020   Lab Results  Component Value Date   ALT 24 04/15/2020   AST 13 (A) 04/15/2020   ALKPHOS 103 01/23/2020   BILITOT 0.7 01/23/2020   Lab Results  Component Value Date   HGBA1C 5.4 04/15/2020   HGBA1C 8.1 (H) 10/12/2019   HGBA1C 7.5 (H) 07/05/2019   HGBA1C 5.9 (H) 01/04/2019  HGBA1C 6.2 09/15/2017   Lab Results  Component Value Date   INSULIN 19.4 10/12/2019   Lab Results  Component Value Date   TSH 3.050 10/12/2019   Lab Results  Component Value Date   CHOL 85 04/15/2020   HDL 33 (A) 04/15/2020   LDLCALC 43 04/15/2020   LDLDIRECT 65.9 09/13/2007   TRIG 122 04/15/2020   CHOLHDL 3.6 10/12/2019   Lab Results  Component Value Date   WBC 11.7 (H) 01/02/2020   HGB 15.2 01/02/2020   HCT 47.8  01/02/2020   MCV 91.7 01/02/2020   PLT 195 01/02/2020   Lab Results  Component Value Date   IRON 86 10/12/2019   TIBC 297 10/12/2019   FERRITIN 80 10/12/2019    Obesity Behavioral Intervention:   Approximately 15 minutes were spent on the discussion below.  ASK: We discussed the diagnosis of obesity with Tanner [name] today and Tanner [name] agreed to give [name] permission to discuss obesity behavioral modification therapy today.  ASSESS: Tanner [name] has the diagnosis of obesity and his BMI today is 41.5. Tanner [name] is in the action stage of change.   ADVISE: Tanner [name] was educated on the multiple health risks of obesity as well as the benefit of weight loss to improve his health. He was advised of the need for long term treatment and the importance of lifestyle modifications to improve his current health and to decrease his risk of future health problems.  AGREE: Multiple dietary modification options and treatment options were discussed and Tanner [name] agreed to follow the recommendations documented in the above note.  ARRANGE: Tanner [name] was educated on the importance of frequent visits to treat obesity as outlined per CMS and USPSTF guidelines and agreed to schedule his next follow up appointment today.  Attestation Statements:   Reviewed by clinician on day of visit: allergies, medications, problem list, medical history, surgical history, family history, social history, and previous encounter notes.  Tanner [name], am acting as Location manager for [employer], DO.  I have reviewed the above documentation for accuracy and completeness, and I agree with the above. Tanner [name], D.O.  The [place] into law in 2016 which includes the topic of electronic health records.  This provides immediate access to information in MyChart.  This includes consultation notes, operative notes, office notes, lab results and pathology reports.  If you have any questions about what you  read please let us know at your next visit so we can discuss your concerns and take corrective action if need be.  We are right here with you.

## 2020-06-03 ENCOUNTER — Ambulatory Visit: Payer: Medicare HMO | Admitting: Orthopaedic Surgery

## 2020-06-04 ENCOUNTER — Encounter: Payer: Self-pay | Admitting: Physical Therapy

## 2020-06-04 ENCOUNTER — Telehealth (INDEPENDENT_AMBULATORY_CARE_PROVIDER_SITE_OTHER): Payer: Self-pay | Admitting: Family

## 2020-06-04 ENCOUNTER — Ambulatory Visit: Payer: Medicare HMO | Attending: Family Medicine | Admitting: Physical Therapy

## 2020-06-04 ENCOUNTER — Other Ambulatory Visit: Payer: Self-pay

## 2020-06-04 DIAGNOSIS — R296 Repeated falls: Secondary | ICD-10-CM | POA: Diagnosis not present

## 2020-06-04 DIAGNOSIS — M6281 Muscle weakness (generalized): Secondary | ICD-10-CM | POA: Insufficient documentation

## 2020-06-04 NOTE — Patient Instructions (Signed)
Access Code: [email or web address] URL: [email or web address] Date: 06/04/2020 Prepared by: Venetia Night Rolanda Campa  Exercises Sit to Stand with Counter Support - 3 x daily - 7 x weekly - 1 sets - 5 reps Standing March with Counter Support - 3 x daily - 7 x weekly - 1 sets - 20 reps Standing Single Leg Stance with Counter Support - 3 x daily - 7 x weekly - 1 sets - 2 reps - 15 hold Seated Long Arc Quad - 1 x daily - 7 x weekly - 2 sets - 10 reps

## 2020-06-04 NOTE — Therapy (Signed)
[facility] [address], [place], [place], [address] Phone: [phone]   Fax:  [phone]  Physical Therapy Evaluation  Patient Details  Name: Tanner [name] MRN: [id number] Date of Birth: 10/12/1945 Referring Provider (PT): R26.89 (ICD-10-CM) - Imbalance   Encounter Date: 06/04/2020   PT End of Session - 06/04/20 1536    Visit Number 1    Date for PT Re-Evaluation 07/30/20    Authorization Type Aetna Medicare    Progress Note Due on Visit 10    PT Start Time 1446    PT Stop Time 1530    PT Time Calculation (min) 44 min    Activity Tolerance Patient tolerated treatment well    Behavior During Therapy [facility] for tasks assessed/performed           Past Medical History:  Diagnosis Date  . Acute kidney failure ([place])   . Arthritis   . Back pain   . BPH (benign prostatic hypertrophy)   . Clostridium difficile infection   . Clotting disorder ([place])   . Depression   . Diabetes ([place]) 12/11/2016  . DVT (deep venous thrombosis) ([place])   . DVT of deep femoral vein, right ([place]) 06/29/2018  . Edema, lower extremity   . High cholesterol   . Hypertension   . Joint pain   . Low back pain potentially associated with spinal stenosis   . Neuromuscular disorder ([place])   . Neuropathy of lower extremity    bilateral  . OSA (obstructive sleep apnea)   . Osteoarthritis   . PE (pulmonary embolism)   . Ventral hernia     Past Surgical History:  Procedure Laterality Date  . EYE SURGERY    . HERNIA REPAIR  1999    There were no vitals filed for this visit.    Subjective Assessment - 06/04/20 1449    Subjective Pt is referred to OPPT for imbalance.  Pt states he falls too often.  Twice he fell getting up from the recliner and he has fallen getting out of his house.  He is scheduled for Lt TKR in Jan 2022.  He is actively losing weight with an MD program to prep for surgery.    Pertinent History Lt knee OA with anticipated  TKR in Jan if he loses enough weight to be cleared    Limitations Walking   transfers, stairs, turning quick   How long can you walk comfortably? 5 min    Patient Stated Goals get stronger and better balance    Currently in Pain? Yes    Pain Score 9     Pain Location Knee    Pain Orientation Left;Anterior    Pain Type Chronic pain    Pain Onset More than a month ago    Pain Frequency Constant    Aggravating Factors  walking    Pain Relieving Factors elevation in recliner              [facility] PT Assessment - 06/04/20 0001      Assessment   Medical Diagnosis R26.89 (ICD-10-CM) - Imbalance    Referring Provider (PT) R26.89 (ICD-10-CM) - Imbalance    Onset Date/Surgical Date --   2 years   Next MD Visit next week    Prior Therapy no      Precautions   Precautions Fall      Restrictions   Weight Bearing Restrictions No      Balance Screen   Has the  patient fallen in the past 6 months Yes    How many times? 7    Has the patient had a decrease in activity level because of a fear of falling?  Yes    Is the patient reluctant to leave their home because of a fear of falling?  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 Status Within Functional Limits for tasks assessed      ROM / Strength   AROM / PROM / Strength AROM;Strength      AROM   Overall AROM  Deficits    Overall AROM Comments trunk ROM limited secondary to imbalance and body habitus    AROM Assessment Site Lumbar    Lumbar Flexion fingers  to knees      Strength   Overall Strength Comments signif abdominal diastasis, trunk cardinal planes 4/5, LEs 4/5 bil      Ambulation/Gait   Ambulation/Gait Yes    Assistive device Straight cane    Gait Pattern Step-through pattern;Wide base of support    Ambulation Surface Level    Stairs Yes    Stairs Assistance 6: Modified independent (Device/Increase time)    Stair Management Technique One rail Right;Step to pattern    Number of Stairs 4    Height of Stairs 6      Standardized Balance Assessment   Standardized Balance Assessment Five Times Sit to Stand;Timed Up and Go Test;Berg Balance Test    Five times sit to stand comments  17 sec, signif use of UEs on arm rests      Berg Balance Test   Sit to Stand Able to stand  independently using hands    Standing Unsupported Able to stand safely 2 minutes    Sitting with Back Unsupported but Feet Supported on Floor or Stool Able to sit safely and securely 2 minutes    Stand to Sit Sits safely with minimal use of hands    Transfers Able to transfer safely, minor use of hands    Standing Unsupported with Eyes Closed Able to stand 10 seconds with supervision    Standing Unsupported with Feet Together Able to place feet together independently and stand for 1 minute with supervision    From Standing, Reach Forward with Outstretched Arm Can reach confidently >25 cm (10")    From Standing Position, Pick up Object from Floor Able to pick up shoe, needs supervision    From Standing Position, Turn to Look Behind Over each Shoulder Turn sideways only but maintains balance    Turn 360 Degrees Able to turn 360 degrees safely but slowly    Standing Unsupported, Alternately Place Feet on Step/Stool Able to stand independently and safely and complete 8 steps in 20 seconds    Standing Unsupported, One Foot in Front Needs help to step but can hold 15 seconds    Standing on One Leg Unable  to try or needs assist to prevent fall    Total Score 41    Berg  comment: 41/56      Timed Up and Go Test   TUG Normal TUG    Normal TUG (seconds) 14    TUG Comments with Rt UE cane                      Objective measurements completed on examination: See above findings.               PT Education - 06/04/20 1528    Education Details Access Code: [email or web address]    Person(s) Educated Patient    Methods Explanation;Demonstration;Handout    Comprehension Verbalized understanding;Returned demonstration            PT Short Term Goals - 06/04/20 1547      PT SHORT TERM GOAL #1   Title Pt will be ind with HEP    Time 3    Period Weeks    Status New    Target Date 06/25/20      PT SHORT TERM GOAL #2   Title Pt will demo sit to stand followed by forward reach for cane 5x without LOB    Time 4    Period Weeks    Status New    Target Date 07/02/20      PT SHORT TERM GOAL #3   Title 5x sit to stand without UE support in 15 sec or less    Time 4    Period Weeks    Status New    Target Date 07/02/20             PT Long Term Goals - 06/04/20 1549      PT LONG TERM GOAL #1   Title Pt will be ind with advanced HEP and understand how to safely progress    Time 8    Period Weeks    Status New    Target Date 07/30/20      PT LONG TERM GOAL #2   Title Pt will improve BERG score to at least 48/56 to demo reduced falls.    Time 8    Period Weeks    Status New    Target Date 07/30/20      PT LONG TERM GOAL #3   Title Pt will report feeling at least 70% more confidence in his balance with stairs and transfers.    Time 8    Period Weeks    Status New    Target Date 07/30/20      PT LONG TERM GOAL #4   Title Pt will achieve at least 4+/5 strength in bil LEs and trunk to improve postural support and stability with transfers and stairs    Time 8    Period Weeks    Status New    Target Date 07/30/20                  Plan - 06/04/20 1538    Clinical Impression Statement Pt is a pleasant 74yo male  referred to OPPT for imbalance.  He has fallen approx 7 times in the past 6 months.  He has fallen when getting out of recliner, using stairs at home entry, and when turning while standing in line at store.  He uses a straight cane in Rt UE for balance and to support Lt painful knee.  Pt's PT may be interrupted by his upcoming scheduled surgery  to have Lt TKR 07/08/20 if he loses enough weight per MD instructions.  He has 4/5 trunk and bil LE strength.  He participated in 5x sit to stand (17 sec with UE use), TUG (14 sec with cane) and BERG (41/56), demonstrating fall risk.  He performs stairs safely using cane + 1 railing using step to pattern.  He ambulates with wide BOS.  Pt's challenges in BERG were mostly in narrow BOS, SLS and eyes closed.  Pt will benefit from skilled PT to address deficits and improve safety.    Personal Factors and Comorbidities Age;Fitness;Comorbidity 1;Comorbidity 2;Comorbidity 3+    Comorbidities DM neuropathy, obesity, Lt knee OA    Examination-Activity Limitations Locomotion Level;Transfers;Stairs;Squat;Bend;Lift    Examination-Participation Restrictions Cleaning;Community Activity;Shop;Laundry;Meal Prep    Stability/Clinical Decision Making Stable/Uncomplicated    Clinical Decision Making Low    Rehab Potential Good    PT Frequency 2x / week    PT Duration 8 weeks    PT Treatment/Interventions ADLs/Self Care Home Management;Balance training;Therapeutic exercise;Functional mobility training;Stair training;Gait training;Therapeutic activities;Patient/family education;Neuromuscular re-education    PT Next Visit Plan NuStep, review HEP, add seated or standing UE ther ex for trunk control, do stairs for functional training, balance progression at counter narrow BOS eyes open, stand with eyes closed    PT Home Exercise Plan Access Code: [email or web address]    Consulted and Agree with Plan of Care Patient           Patient will benefit from skilled therapeutic intervention in order to  improve the following deficits and impairments:  Abnormal gait,Decreased range of motion,Difficulty walking,Obesity,Decreased endurance,Pain,Decreased activity tolerance,Decreased balance,Improper body mechanics,Postural dysfunction,Decreased strength,Decreased mobility  Visit Diagnosis: Muscle weakness (generalized) - Plan: PT plan of care cert/re-cert  Repeated falls - Plan: PT plan of care cert/re-cert     Problem List Patient Active Problem List   Diagnosis Date Noted  . Hypertension associated with type 2 diabetes mellitus ([place]) 05/23/2020  . Chronic kidney disease (CKD), stage III (moderate) ([place]) 12/13/2019  . Carpal tunnel syndrome 12/05/2019  . Coronary artery disease involving native coronary artery of native heart without angina pectoris 11/15/2019  . Elevated creatine kinase 11/15/2019  . Aortic atherosclerosis ([place]) 11/15/2019  . Recurrent Clostridium difficile diarrhea 05/28/2017  . Peripheral neuropathy 03/17/2017  . Diabetes ([place]) 12/11/2016  . Allergic rhinitis 12/11/2016  . Degenerative arthritis of left knee 01/30/2016  . Class 3 severe obesity with serious comorbidity and body mass index (BMI) of 45.0 to 49.9 in adult ([place]) 01/30/2016  . History of pulmonary embolus (PE) 12/19/2013  . Anxiety state 03/29/2007  . Major depressive disorder 03/29/2007  . OSA (obstructive sleep apnea) 03/29/2007  . Unspecified glaucoma 03/29/2007  . BPH (benign prostatic hyperplasia) 03/29/2007  . OA (osteoarthritis) of knee 03/29/2007  . Essential hypertension 03/29/2007  . History of Bell's palsy 03/29/2007    Alene Mires Cordella Nyquist 06/04/2020, 3:54 PM  [facility] [address] [place], [place], [address] Phone: [phone]   Fax:  [phone]  Name: Tanner [name] MRN: [id number] Date of Birth: 21-May-1946

## 2020-06-04 NOTE — Telephone Encounter (Signed)
Spoke with pt reviewed recommendations  understands  informed him to call if still having issues

## 2020-06-04 NOTE — Nursing Note (Signed)
Called in stating hisbp has been running 120 to 150's over 90 to 100 for past couple months   This weekend he developed chest pain and had to take NTG when his BP was 150 100  advise

## 2020-06-06 ENCOUNTER — Other Ambulatory Visit: Payer: Self-pay

## 2020-06-06 ENCOUNTER — Ambulatory Visit: Payer: Medicare HMO | Admitting: Physical Therapy

## 2020-06-06 DIAGNOSIS — R296 Repeated falls: Secondary | ICD-10-CM | POA: Diagnosis not present

## 2020-06-06 DIAGNOSIS — M6281 Muscle weakness (generalized): Secondary | ICD-10-CM

## 2020-06-06 NOTE — Patient Instructions (Signed)
Access Code: [email or web address] URL: [email or web address] Date: 06/06/2020 Prepared by: Ruben Im  Exercises Sit to Stand with Counter Support - 3 x daily - 7 x weekly - 1 sets - 5 reps Standing March with Counter Support - 3 x daily - 7 x weekly - 1 sets - 20 reps Standing Single Leg Stance with Counter Support - 3 x daily - 7 x weekly - 1 sets - 2 reps - 15 hold Seated Long Arc Quad - 1 x daily - 7 x weekly - 2 sets - 10 reps Seated Hip Abduction with Resistance - 1 x daily - 7 x weekly - 1 sets - 20 reps Seated Hip Flexion - 1 x daily - 7 x weekly - 1 sets - 10 reps Seated Knee Extension with Resistance - 1 x daily - 7 x weekly - 1 sets - 10 reps Seated Shoulder Diagonal Pull Up with Resistance - 1 x daily - 7 x weekly - 1 sets - 10 reps Push-Up on Counter - 1 x daily - 7 x weekly - 1 sets - 10 reps

## 2020-06-06 NOTE — Therapy (Signed)
[facility] [address] [place], [place], [address] Phone: [phone]   Fax:  [phone]  Physical Therapy Treatment  Patient Details  Name: Tanner [name] MRN: [id number] Date of Birth: [birth year] Referring Provider (PT): R26.89 (ICD-10-CM) - Imbalance   Encounter Date: 06/06/2020   PT End of Session - 06/06/20 1748    Visit Number 2    Date for PT Re-Evaluation 07/30/20    Authorization Type Aetna Medicare    Progress Note Due on Visit 10    PT Start Time [id number]    PT Stop Time 1058    PT Time Calculation (min) 43 min    Activity Tolerance Patient tolerated treatment well           Past Medical History:  Diagnosis Date  . Acute kidney failure ([place])   . Arthritis   . Back pain   . BPH (benign prostatic hypertrophy)   . Clostridium difficile infection   . Clotting disorder ([place])   . Depression   . Diabetes ([place]) 12/11/2016  . DVT (deep venous thrombosis) ([place])   . DVT of deep femoral vein, right ([place]) 06/29/2018  . Edema, lower extremity   . High cholesterol   . Hypertension   . Joint pain   . Low back pain potentially associated with spinal stenosis   . Neuromuscular disorder ([place])   . Neuropathy of lower extremity    bilateral  . OSA (obstructive sleep apnea)   . Osteoarthritis   . PE (pulmonary embolism)   . Ventral hernia     Past Surgical History:  Procedure Laterality Date  . EYE SURGERY    . HERNIA REPAIR  1999    There were no vitals filed for this visit.   Subjective Assessment - 06/06/20 1019    Subjective I wasn't sure how many times and how often to do the exercises.  I walked a lot Christmas shopping yesterday.    Pertinent History Lt knee OA with anticipated TKR in Jan if he loses enough weight to be cleared    Patient Stated Goals get stronger and better balance    Currently in Pain? Yes    Pain Score 9     Pain Location Knee    Pain Orientation Left    Pain Type Chronic  pain                             [facility] Adult PT Treatment/Exercise - 06/06/20 0001      Knee/Hip Exercises: Aerobic   Nustep L1 5 min while discussing status      Knee/Hip Exercises: Standing   SLS 5x right/left with UE assist needed    Other Standing Knee Exercises marching 20x per HEP    Other Standing Knee Exercises counter top push ups 15x      Knee/Hip Exercises: Seated   Long Arc Quad Right;Left;10 reps    Long Arc Quad Limitations red band    Clamshell with TheraBand Red   20x   Other Seated Knee/Hip Exercises red band anchored under feet with pulling up 10x    Marching Right;Left;10 reps    Marching Limitations red band around feet    Sit to [employer] 5 reps   black cushion, no hands                 PT Education - 06/06/20 1747    Education Details red band seated  clams, knee extensions and march; UE seated pulling up on band anchored under feet;  counter push ups    Person(s) Educated Patient    Methods Explanation;Demonstration;Handout    Comprehension Returned demonstration;Verbalized understanding            PT Short Term Goals - 06/04/20 1547      PT SHORT TERM GOAL #1   Title Pt will be ind with HEP    Time 3    Period Weeks    Status New    Target Date 06/25/20      PT SHORT TERM GOAL #2   Title Pt will demo sit to stand followed by forward reach for cane 5x without LOB    Time 4    Period Weeks    Status New    Target Date 07/02/20      PT SHORT TERM GOAL #3   Title 5x sit to stand without UE support in 15 sec or less    Time 4    Period Weeks    Status New    Target Date 07/02/20             PT Long Term Goals - 06/04/20 1549      PT LONG TERM GOAL #1   Title Pt will be ind with advanced HEP and understand how to safely progress    Time 8    Period Weeks    Status New    Target Date 07/30/20      PT LONG TERM GOAL #2   Title Pt will improve BERG score to at least 48/56 to demo reduced falls.    Time 8     Period Weeks    Status New    Target Date 07/30/20      PT LONG TERM GOAL #3   Title Pt will report feeling at least 70% more confidence in his balance with stairs and transfers.    Time 8    Period Weeks    Status New    Target Date 07/30/20      PT LONG TERM GOAL #4   Title Pt will achieve at least 4+/5 strength in bil LEs and trunk to improve postural support and stability with transfers and stairs    Time 8    Period Weeks    Status New    Target Date 07/30/20                 Plan - 06/06/20 1749    Clinical Impression Statement The patient returns for first follow up after inital eval.  Demonstrates follow through with initial HEP just needs instruction on where to find frequency and reps.  Added seated resistance to LEs for strengthening in the seated position which did not aggravate his left arthritic knee (TKR planned for 07/08/20).  He hopes to improve his balance to prepare for his upcoming surgery.  Therapist monitoring response throughout treatment session and providing close supervision for safety secondary to fall risk.    Comorbidities DM neuropathy, obesity, Lt knee OA    Examination-Activity Limitations Locomotion Level;Transfers;Stairs;Squat;Bend;Lift    Examination-Participation Restrictions Cleaning;Community Activity;Shop;Laundry;Meal Prep    Rehab Potential Good    PT Frequency 2x / week    PT Duration 8 weeks    PT Treatment/Interventions ADLs/Self Care Home Management;Balance training;Therapeutic exercise;Functional mobility training;Stair training;Gait training;Therapeutic activities;Patient/family education;Neuromuscular re-education    PT Next Visit Plan NuStep, review HEP, seated or standing UE ther ex for trunk control, do  stairs for functional training, balance progression at counter narrow BOS eyes open, stand with eyes closed    PT Home Exercise Plan Access Code: [email or web address]           Patient will benefit from skilled therapeutic intervention in  order to improve the following deficits and impairments:  Abnormal gait,Decreased range of motion,Difficulty walking,Obesity,Decreased endurance,Pain,Decreased activity tolerance,Decreased balance,Improper body mechanics,Postural dysfunction,Decreased strength,Decreased mobility  Visit Diagnosis: Muscle weakness (generalized)  Repeated falls     Problem List Patient Active Problem List   Diagnosis Date Noted  . Hypertension associated with type 2 diabetes mellitus ([place]) 05/23/2020  . Chronic kidney disease (CKD), stage III (moderate) ([place]) 12/13/2019  . Carpal tunnel syndrome 12/05/2019  . Coronary artery disease involving native coronary artery of native heart without angina pectoris 11/15/2019  . Elevated creatine kinase 11/15/2019  . Aortic atherosclerosis ([place]) 11/15/2019  . Recurrent Clostridium difficile diarrhea 05/28/2017  . Peripheral neuropathy 03/17/2017  . Diabetes ([place]) 12/11/2016  . Allergic rhinitis 12/11/2016  . Degenerative arthritis of left knee 01/30/2016  . Class 3 severe obesity with serious comorbidity and body mass index (BMI) of 45.0 to 49.9 in adult ([place]) 01/30/2016  . History of pulmonary embolus (PE) 12/19/2013  . Anxiety state 03/29/2007  . Major depressive disorder 03/29/2007  . OSA (obstructive sleep apnea) 03/29/2007  . Unspecified glaucoma 03/29/2007  . BPH (benign prostatic hyperplasia) 03/29/2007  . OA (osteoarthritis) of knee 03/29/2007  . Essential hypertension 03/29/2007  . History of Bell's palsy 03/29/2007   Ruben Im, PT 06/06/20 5:54 PM Phone: [phone] Fax: [phone] Alvera Singh 06/06/2020, 5:54 PM  [facility] [address], [place], [place], [address] Phone: [phone]   Fax:  [phone]  Name: Tanner [name] MRN: [id number] Date of Birth: [birth year]

## 2020-06-13 ENCOUNTER — Other Ambulatory Visit: Payer: Self-pay | Admitting: Orthopedic Surgery

## 2020-06-13 DIAGNOSIS — Z01818 Encounter for other preprocedural examination: Secondary | ICD-10-CM

## 2020-06-17 ENCOUNTER — Telehealth: Payer: Self-pay | Admitting: *Deleted

## 2020-06-17 ENCOUNTER — Other Ambulatory Visit (INDEPENDENT_AMBULATORY_CARE_PROVIDER_SITE_OTHER): Payer: Self-pay | Admitting: Medical

## 2020-06-17 MED ORDER — HYDRALAZINE 10 MG TABLET
10.0000 mg | ORAL_TABLET | Freq: Three times a day (TID) | ORAL | 2 refills | Status: DC
Start: 2020-06-17 — End: 2021-02-07

## 2020-06-17 NOTE — Telephone Encounter (Signed)
Patient called.  Is to have knee replacement surgery 07/08/2020.  When should he stop his Eliquis?  Per Dr Myna Hidalgo, stop Eliquis 2 days prior to surgery and restart day after.  Will see Dr Myna Hidalgo on 07/04/2020 right before his surgery. Patient understands instructions and will comply

## 2020-06-17 NOTE — Telephone Encounter (Signed)
Spoke with pt  reviewed recommendation  understands  script sent

## 2020-06-17 NOTE — Nursing Note (Signed)
Called in stating since the increase of lisinopril on 12/21 his BP are still running high 137-155  /85 -105   Asking to advise

## 2020-06-18 ENCOUNTER — Telehealth: Payer: Self-pay

## 2020-06-18 DIAGNOSIS — M1712 Unilateral primary osteoarthritis, left knee: Secondary | ICD-10-CM | POA: Diagnosis not present

## 2020-06-18 NOTE — Telephone Encounter (Signed)
Rec'd request from [facility] for pre-operative risk assessment for left knee arthroplasty.  Notes, labs, EKG or speak studies need to be returned with assessment. Pt notified that surgical clearance appt needed.  Pt states surgery has been delayed 6weeks b/c more wt loss is needed (originally scheduled for 1/24).  Pt last OV 12/12/19; noted in that AVS that pt needed 39mo f/u.  Appt scheduled for surgical clearance & f/u for 07/23/20 after visit with Dr Katrinka Blazing in Sports Med.  Will place form needed with Dr Okey Dupre CMA.

## 2020-06-20 DIAGNOSIS — E261 Secondary hyperaldosteronism: Secondary | ICD-10-CM | POA: Diagnosis not present

## 2020-06-20 DIAGNOSIS — Z008 Encounter for other general examination: Secondary | ICD-10-CM | POA: Diagnosis not present

## 2020-06-20 DIAGNOSIS — I25119 Atherosclerotic heart disease of native coronary artery with unspecified angina pectoris: Secondary | ICD-10-CM | POA: Diagnosis not present

## 2020-06-20 DIAGNOSIS — I700 Atherosclerosis of aorta: Secondary | ICD-10-CM | POA: Diagnosis not present

## 2020-06-20 DIAGNOSIS — I509 Heart failure, unspecified: Secondary | ICD-10-CM | POA: Diagnosis not present

## 2020-06-20 DIAGNOSIS — I130 Hypertensive heart and chronic kidney disease with heart failure and stage 1 through stage 4 chronic kidney disease, or unspecified chronic kidney disease: Secondary | ICD-10-CM | POA: Diagnosis not present

## 2020-06-20 DIAGNOSIS — R69 Illness, unspecified: Secondary | ICD-10-CM | POA: Diagnosis not present

## 2020-06-20 DIAGNOSIS — E1142 Type 2 diabetes mellitus with diabetic polyneuropathy: Secondary | ICD-10-CM | POA: Diagnosis not present

## 2020-06-20 DIAGNOSIS — E1122 Type 2 diabetes mellitus with diabetic chronic kidney disease: Secondary | ICD-10-CM | POA: Diagnosis not present

## 2020-06-20 DIAGNOSIS — Z6841 Body Mass Index (BMI) 40.0 and over, adult: Secondary | ICD-10-CM | POA: Diagnosis not present

## 2020-06-25 ENCOUNTER — Other Ambulatory Visit: Payer: Self-pay

## 2020-06-25 ENCOUNTER — Encounter (INDEPENDENT_AMBULATORY_CARE_PROVIDER_SITE_OTHER): Payer: Self-pay | Admitting: Family Medicine

## 2020-06-25 ENCOUNTER — Ambulatory Visit (INDEPENDENT_AMBULATORY_CARE_PROVIDER_SITE_OTHER): Payer: Medicare HMO | Admitting: Family Medicine

## 2020-06-25 VITALS — BP 130/70 | HR 65 | Temp 97.9°F | Ht 65.0 in | Wt 254.0 lb

## 2020-06-25 DIAGNOSIS — R600 Localized edema: Secondary | ICD-10-CM | POA: Diagnosis not present

## 2020-06-25 DIAGNOSIS — G8929 Other chronic pain: Secondary | ICD-10-CM

## 2020-06-25 DIAGNOSIS — E1169 Type 2 diabetes mellitus with other specified complication: Secondary | ICD-10-CM

## 2020-06-25 DIAGNOSIS — Z6841 Body Mass Index (BMI) 40.0 and over, adult: Secondary | ICD-10-CM | POA: Diagnosis not present

## 2020-06-25 DIAGNOSIS — M25561 Pain in right knee: Secondary | ICD-10-CM | POA: Diagnosis not present

## 2020-06-26 ENCOUNTER — Other Ambulatory Visit: Payer: Self-pay

## 2020-06-26 ENCOUNTER — Encounter: Payer: Self-pay | Admitting: Physical Therapy

## 2020-06-26 ENCOUNTER — Ambulatory Visit: Payer: Medicare HMO | Attending: Family Medicine | Admitting: Physical Therapy

## 2020-06-26 DIAGNOSIS — M6281 Muscle weakness (generalized): Secondary | ICD-10-CM | POA: Insufficient documentation

## 2020-06-26 DIAGNOSIS — R296 Repeated falls: Secondary | ICD-10-CM

## 2020-06-26 MED ORDER — OZEMPIC (0.25 OR 0.5 MG/DOSE) 2 MG/1.5ML ~~LOC~~ SOPN: PEN_INJECTOR | SUBCUTANEOUS | 0 refills | Status: DC

## 2020-06-26 NOTE — Therapy (Signed)
[facility] [address], [place], [place], [address] Phone: [phone]   Fax:  [phone]  Physical Therapy Treatment  Patient Details  Name: Tanner [name] MRN: [id number] Date of Birth: [birth year] Referring Provider (PT): R26.89 (ICD-10-CM) - Imbalance   Encounter Date: 06/26/2020   PT End of Session - 06/26/20 1001    Visit Number 3    Date for PT Re-Evaluation 07/30/20    Authorization Type Aetna Medicare    PT Start Time 1001    PT Stop Time 1045    PT Time Calculation (min) 44 min    Activity Tolerance Patient tolerated treatment well    Behavior During Therapy [facility] for tasks assessed/performed           Past Medical History:  Diagnosis Date  . Acute kidney failure ([place])   . Arthritis   . Back pain   . BPH (benign prostatic hypertrophy)   . Clostridium difficile infection   . Clotting disorder ([place])   . Depression   . Diabetes ([place]) 12/11/2016  . DVT (deep venous thrombosis) ([place])   . DVT of deep femoral vein, right ([place]) 06/29/2018  . Edema, lower extremity   . High cholesterol   . Hypertension   . Joint pain   . Low back pain potentially associated with spinal stenosis   . Neuromuscular disorder ([place])   . Neuropathy of lower extremity    bilateral  . OSA (obstructive sleep apnea)   . Osteoarthritis   . PE (pulmonary embolism)   . Ventral hernia     Past Surgical History:  Procedure Laterality Date  . EYE SURGERY    . HERNIA REPAIR  1999    There were no vitals filed for this visit.   Subjective Assessment - 06/26/20 1011    Subjective Pain today a little higher than my average.    Pertinent History Lt knee OA with anticipated TKR in Jan if he loses enough weight to be cleared    Currently in Pain? Yes    Pain Score 6     Pain Location Knee    Pain Orientation Left    Pain Descriptors / Indicators Dull;Aching    Aggravating Factors  walking    Pain Relieving Factors Sititng                              [facility] Adult PT Treatment/Exercise - 06/26/20 0001      Knee/Hip Exercises: Aerobic   Nustep L1 x 8 min PTA present      Knee/Hip Exercises: Standing   Heel Raises Both;2 sets;10 reps    Other Standing Knee Exercises SLS LT with Rt toe taps on TM 2x10      Knee/Hip Exercises: Seated   Long Arc Quad Left;2 sets;10 reps;Weights    Long Arc Quad Weight 2 lbs.    Ball Squeeze 3 sec hold 10x    Clamshell with TheraBand --   Green loop 20x   Marching Both;1 set;20 reps   green loop                 PT Education - 06/26/20 1043    Education Details HEP: Pt is open to adding another round of walking to his HEP but not as many reps. Currently he walks the driveway 5x, the second round may be 1-3 driveway lengths.    Person(s) Educated Patient    Methods  Explanation    Comprehension Verbalized understanding            PT Short Term Goals - 06/04/20 1547      PT SHORT TERM GOAL #1   Title Pt will be ind with HEP    Time 3    Period Weeks    Status New    Target Date 06/25/20      PT SHORT TERM GOAL #2   Title Pt will demo sit to stand followed by forward reach for cane 5x without LOB    Time 4    Period Weeks    Status New    Target Date 07/02/20      PT SHORT TERM GOAL #3   Title 5x sit to stand without UE support in 15 sec or less    Time 4    Period Weeks    Status New    Target Date 07/02/20             PT Long Term Goals - 06/04/20 1549      PT LONG TERM GOAL #1   Title Pt will be ind with advanced HEP and understand how to safely progress    Time 8    Period Weeks    Status New    Target Date 07/30/20      PT LONG TERM GOAL #2   Title Pt will improve BERG score to at least 48/56 to demo reduced falls.    Time 8    Period Weeks    Status New    Target Date 07/30/20      PT LONG TERM GOAL #3   Title Pt will report feeling at least 70% more confidence in his balance with stairs and transfers.    Time  8    Period Weeks    Status New    Target Date 07/30/20      PT LONG TERM GOAL #4   Title Pt will achieve at least 4+/5 strength in bil LEs and trunk to improve postural support and stability with transfers and stairs    Time 8    Period Weeks    Status New    Target Date 07/30/20                 Plan - 06/26/20 1002    Clinical Impression Statement Pt saw MD, is giving pt 6 more weeks ( beyond 1/24 )  to loose another 10#. Pt is discouraged at this time and does not understand why the MD changed "the rules." Knee pain about the same and he remains compliant with HEP.  We discussed ways to increase his activity at home without increasing his knee pain. Pt is open to adding another walking bout to his day in hopes of achieving the new weight loss goal. Mild knee pain with standing exercises.    Personal Factors and Comorbidities Age;Fitness;Comorbidity 1;Comorbidity 2;Comorbidity 3+    Comorbidities DM neuropathy, obesity, Lt knee OA    Examination-Activity Limitations Locomotion Level;Transfers;Stairs;Squat;Bend;Lift    Examination-Participation Restrictions Cleaning;Community Activity;Shop;Laundry;Meal Prep    Stability/Clinical Decision Making Stable/Uncomplicated    Rehab Potential Good    PT Frequency 2x / week    PT Duration 8 weeks    PT Treatment/Interventions ADLs/Self Care Home Management;Balance training;Therapeutic exercise;Functional mobility training;Stair training;Gait training;Therapeutic activities;Patient/family education;Neuromuscular re-education    PT Next Visit Plan Nustep; increase time or resistance to help in weight loss goal. Consider seated trunk exercises, balance exercises.  PT Home Exercise Plan Access Code: [email or web address] and Agree with Plan of Care Patient           Patient will benefit from skilled therapeutic intervention in order to improve the following deficits and impairments:  Abnormal gait,Decreased range of motion,Difficulty  walking,Obesity,Decreased endurance,Pain,Decreased activity tolerance,Decreased balance,Improper body mechanics,Postural dysfunction,Decreased strength,Decreased mobility  Visit Diagnosis: Muscle weakness (generalized)  Repeated falls     Problem List Patient Active Problem List   Diagnosis Date Noted  . Hypertension associated with type 2 diabetes mellitus ([place]) 05/23/2020  . Chronic kidney disease (CKD), stage III (moderate) ([place]) 12/13/2019  . Carpal tunnel syndrome 12/05/2019  . Coronary artery disease involving native coronary artery of native heart without angina pectoris 11/15/2019  . Elevated creatine kinase 11/15/2019  . Aortic atherosclerosis ([place]) 11/15/2019  . Recurrent Clostridium difficile diarrhea 05/28/2017  . Peripheral neuropathy 03/17/2017  . Diabetes ([place]) 12/11/2016  . Allergic rhinitis 12/11/2016  . Degenerative arthritis of left knee 01/30/2016  . Class 3 severe obesity with serious comorbidity and body mass index (BMI) of 45.0 to 49.9 in adult ([place]) 01/30/2016  . History of pulmonary embolus (PE) 12/19/2013  . Anxiety state 03/29/2007  . Major depressive disorder 03/29/2007  . OSA (obstructive sleep apnea) 03/29/2007  . Unspecified glaucoma 03/29/2007  . BPH (benign prostatic hyperplasia) 03/29/2007  . OA (osteoarthritis) of knee 03/29/2007  . Essential hypertension 03/29/2007  . History of Bell's palsy 03/29/2007    COCHRAN,JENNIFER, PTA 06/26/2020, 10:49 AM  [facility] [address] [place], [place], [address] Phone: [phone]   Fax:  [phone]  Name: Tanner [name] MRN: [id number] Date of Birth: [birth year]

## 2020-06-27 ENCOUNTER — Telehealth (INDEPENDENT_AMBULATORY_CARE_PROVIDER_SITE_OTHER): Payer: Self-pay | Admitting: Family

## 2020-06-27 NOTE — Progress Notes (Signed)
Chief Complaint:   OBESITY Tanner [name] is here to discuss his progress with his obesity treatment plan along with follow-up of his obesity related diagnoses.   Today's visit was #: 11 Starting weight: 283 lbs Starting date: 10/12/2019 Today's weight: 254 lbs Today's date: 06/27/2020 Total lbs lost to date: 29 lbs Body mass index is 42.27 kg/m.  Total weight loss percentage to date: -10.25%  Interim History: Ashan says he is out of Ozempic and had trouble getting it.  He is up in weight.  He says his knee pain is worsening.  He has increased lower extremity edema.  Nutrition Plan: the Category 1 Plan for 0% of the time.  Activity: Stretching and strength training for 25 minutes 7 times per week.  Assessment/Plan:   1. Type 2 diabetes mellitus with other specified complication, without long-term current use of insulin (HCC) Diabetes Mellitus: stable. Medication: Ozempic. Issues reviewed with him: blood sugar goals, complications of diabetes mellitus, hypoglycemia prevention and treatment, exercise, and nutrition.  Will restart Ozempic at 0.25 mg subcutaneously weekly for 2 weeks, then increase to 0.5 mg subcutaneously weekly for 2 weeks.  Lab Results  Component Value Date   HGBA1C 5.4 04/15/2020   HGBA1C 8.1 (H) 10/12/2019   HGBA1C 7.5 (H) 07/05/2019   Lab Results  Component Value Date   LDLCALC 43 04/15/2020   CREATININE 1.43 (H) 01/23/2020   -Refill Semaglutide,0.25 or 0.5MG /DOS, (OZEMPIC, 0.25 OR 0.5 MG/DOSE,) 2 MG/1.5ML SOPN; Inject 0.25MG  weekly x 2 weeks, then increase to 0.5MG  weekly for 2 weeks  Dispense: 1.5 mL; Refill: 0  2. Chronic pain of right knee Worsening.  Working toward BMI approval for TKR.  3. Lower extremity edema Worsening.  He is taking furosemide 40 mg daily. We reviewed monitoring, decreasing salt, and elevating legs.   4. Class 3 severe obesity with serious comorbidity and body mass index (BMI) of 40.0 to 44.9 in adult, unspecified obesity  type [facility])  Course: Virgel is currently in the action stage of change. As such, his goal is to continue with weight loss efforts.   Nutrition goals: He has agreed to the Category 1 Plan.   Exercise goals: Older adults should follow the adult guidelines. When older adults cannot meet the adult guidelines, they should be as physically active as their abilities and conditions will allow.  Older adults should do exercises that maintain or improve balance if they are at risk of falling.   Behavioral modification strategies: increasing lean protein intake, decreasing simple carbohydrates, increasing vegetables and increasing water intake.  Dash has agreed to follow-up with our clinic in 3 weeks. He was informed of the importance of frequent follow-up visits to maximize his success with intensive lifestyle modifications for his multiple health conditions.   Objective:   Blood pressure 130/70, pulse 65, temperature 97.9 F (36.6 C), temperature source Oral, height 5\' 5"  (1.651 m), weight 254 lb (115.2 kg), SpO2 91 %. Body mass index is 42.27 kg/m.  General: Cooperative, alert, well developed, in no acute distress. HEENT: Conjunctivae and lids unremarkable. Cardiovascular: Regular rhythm.  Lungs: Normal work of breathing. Neurologic: No focal deficits.   Lab Results  Component Value Date   CREATININE 1.43 (H) 01/23/2020   BUN 16 01/23/2020   NA 143 01/23/2020   K 4.6 01/23/2020   CL 106 04/15/2020   CO2 31 (A) 04/15/2020   Lab Results  Component Value Date   ALT 24 04/15/2020   AST 13 (A) 04/15/2020   ALKPHOS  103 01/23/2020   BILITOT 0.7 01/23/2020   Lab Results  Component Value Date   HGBA1C 5.4 04/15/2020   HGBA1C 8.1 (H) 10/12/2019   HGBA1C 7.5 (H) 07/05/2019   HGBA1C 5.9 (H) 01/04/2019   HGBA1C 6.2 09/15/2017   Lab Results  Component Value Date   INSULIN 19.4 10/12/2019   Lab Results  Component Value Date   TSH 3.050 10/12/2019   Lab Results  Component Value  Date   CHOL 85 04/15/2020   HDL 33 (A) 04/15/2020   LDLCALC 43 04/15/2020   LDLDIRECT 65.9 09/13/2007   TRIG 122 04/15/2020   CHOLHDL 3.6 10/12/2019   Lab Results  Component Value Date   WBC 11.7 (H) 01/02/2020   HGB 15.2 01/02/2020   HCT 47.8 01/02/2020   MCV 91.7 01/02/2020   PLT 195 01/02/2020   Lab Results  Component Value Date   IRON 86 10/12/2019   TIBC 297 10/12/2019   FERRITIN 80 10/12/2019   Obesity Behavioral Intervention:   Approximately 15 minutes were spent on the discussion below.  ASK: We discussed the diagnosis of obesity with [name] today and [place] agreed to give [name] permission to discuss obesity behavioral modification therapy today.  ASSESS: Dvontae has the diagnosis of obesity and his BMI today is 42.4. Samier is in the action stage of change.   ADVISE: Tank was educated on the multiple health risks of obesity as well as the benefit of weight loss to improve his health. He was advised of the need for long term treatment and the importance of lifestyle modifications to improve his current health and to decrease his risk of future health problems.  AGREE: Multiple dietary modification options and treatment options were discussed and [place] agreed to follow the recommendations documented in the above note.  ARRANGE: Shahil was educated on the importance of frequent visits to treat obesity as outlined per CMS and USPSTF guidelines and agreed to schedule his next follow up appointment today.  Attestation Statements:   Reviewed by clinician on day of visit: allergies, medications, problem list, medical history, surgical history, family history, social history, and previous encounter notes.  I, Water quality scientist, CMA, am acting as transcriptionist for Briscoe Deutscher, DO  I have reviewed the above documentation for accuracy and completeness, and I agree with the above. Briscoe Deutscher, DO

## 2020-06-27 NOTE — Nursing Note (Signed)
Called in with BP reading on 12/21 BP was high started hydralazine 10 tid   Told to  Watch BP   Today reading are from113-147   Over 76-90   Those are taken 2 hours after taken his meds

## 2020-06-27 NOTE — Telephone Encounter (Signed)
Spoke with pt reviewed recommendations  understands

## 2020-06-28 ENCOUNTER — Encounter: Payer: Self-pay | Admitting: Physical Therapy

## 2020-06-28 ENCOUNTER — Ambulatory Visit: Payer: Medicare HMO | Admitting: Physical Therapy

## 2020-06-28 ENCOUNTER — Other Ambulatory Visit: Payer: Self-pay

## 2020-06-28 DIAGNOSIS — M6281 Muscle weakness (generalized): Secondary | ICD-10-CM | POA: Diagnosis not present

## 2020-06-28 DIAGNOSIS — R296 Repeated falls: Secondary | ICD-10-CM

## 2020-06-28 NOTE — Therapy (Signed)
[facility] [address] [place], [place], [address] Phone: [phone]   Fax:  [phone]  Physical Therapy Treatment  Patient Details  Name: Tanner [name] MRN: [id number] Date of Birth: 09-15-1945 Referring Provider (PT): R26.89 (ICD-10-CM) - Imbalance   Encounter Date: 06/28/2020   PT End of Session - 06/28/20 1009    Visit Number 4    Date for PT Re-Evaluation 07/30/20    Authorization Type Aetna Medicare    Progress Note Due on Visit 10    PT Start Time 1009    PT Stop Time 1048    PT Time Calculation (min) 39 min    Activity Tolerance Patient tolerated treatment well    Behavior During Therapy [facility] for tasks assessed/performed           Past Medical History:  Diagnosis Date  . Acute kidney failure ([place])   . Arthritis   . Back pain   . BPH (benign prostatic hypertrophy)   . Clostridium difficile infection   . Clotting disorder ([place])   . Depression   . Diabetes ([place]) 12/11/2016  . DVT (deep venous thrombosis) ([place])   . DVT of deep femoral vein, right ([place]) 06/29/2018  . Edema, lower extremity   . High cholesterol   . Hypertension   . Joint pain   . Low back pain potentially associated with spinal stenosis   . Neuromuscular disorder ([place])   . Neuropathy of lower extremity    bilateral  . OSA (obstructive sleep apnea)   . Osteoarthritis   . PE (pulmonary embolism)   . Ventral hernia     Past Surgical History:  Procedure Laterality Date  . EYE SURGERY    . HERNIA REPAIR  1999    There were no vitals filed for this visit.   Subjective Assessment - 06/28/20 1010    Subjective I'm getting along slowly this AM. KNee was pretty sore after last session.    Pertinent History Lt knee OA with anticipated TKR in Jan if he loses enough weight to be cleared    Currently in Pain? Yes    Pain Score 8     Pain Location Knee    Pain Orientation Left    Pain Descriptors / Indicators Sore;Tightness     Multiple Pain Sites No              [facility] PT Assessment - 06/28/20 0001      Standardized Balance Assessment   Five times sit to stand comments  17 sec moderate use of UE, UE more on deceleration      Berg Balance Test   Sit to Stand Able to stand  independently using hands    Standing Unsupported Able to stand safely 2 minutes    Sitting with Back Unsupported but Feet Supported on Floor or Stool Able to sit safely and securely 2 minutes    Stand to Sit Sits safely with minimal use of hands    Transfers Able to transfer safely, minor use of hands    Standing Unsupported with Eyes Closed Able to stand 10 seconds with supervision    Standing Unsupported with Feet Together Able to place feet together independently and stand for 1 minute with supervision    From Standing, Reach Forward with Outstretched Arm Can reach confidently >25 cm (10")    From Standing Position, Pick up Object from Floor Able to pick up shoe, needs supervision    From Standing Position,  Turn to Look Behind Over each Shoulder Turn sideways only but maintains balance    Turn 360 Degrees Able to turn 360 degrees safely one side only in 4 seconds or less    Standing Unsupported, Alternately Place Feet on Step/Stool Able to stand independently and safely and complete 8 steps in 20 seconds    Standing Unsupported, One Foot in Front Able to take small step independently and hold 30 seconds    Standing on One Leg Tries to lift leg/unable to hold 3 seconds but remains standing independently    Total Score 44    Berg comment: 44/56                         [facility] Adult PT Treatment/Exercise - 06/28/20 0001      Knee/Hip Exercises: Aerobic   Nustep L2 x 10 min PTA present      Knee/Hip Exercises: Standing   Heel Raises Both;2 sets;15 reps      Knee/Hip Exercises: Seated   Long Arc Quad Strengthening;Left;2 sets;15 reps    Long Arc Quad Weight 2 lbs.    Ball Squeeze 5 sec hold 10x2    Clamshell with  TheraBand --   red loop 20x2   Marching Both;20 reps;2 sets   green loop                   PT Short Term Goals - 06/28/20 1032      PT SHORT TERM GOAL #1   Title Pt will be ind with HEP    Time 3    Period Weeks    Status Achieved    Target Date 06/25/20      PT SHORT TERM GOAL #2   Title Pt will demo sit to stand followed by forward reach for cane 5x without LOB    Time 4    Period Weeks    Status Achieved    Target Date 07/02/20      PT SHORT TERM GOAL #3   Title 5x sit to stand without UE support in 15 sec or less    Time 4    Period Weeks    Status On-going   17 sec with mod UE use            PT Long Term Goals - 06/04/20 1549      PT LONG TERM GOAL #1   Title Pt will be ind with advanced HEP and understand how to safely progress    Time 8    Period Weeks    Status New    Target Date 07/30/20      PT LONG TERM GOAL #2   Title Pt will improve BERG score to at least 48/56 to demo reduced falls.    Time 8    Period Weeks    Status New    Target Date 07/30/20      PT LONG TERM GOAL #3   Title Pt will report feeling at least 70% more confidence in his balance with stairs and transfers.    Time 8    Period Weeks    Status New    Target Date 07/30/20      PT LONG TERM GOAL #4   Title Pt will achieve at least 4+/5 strength in bil LEs and trunk to improve postural support and stability with transfers and stairs    Time 8    Period Weeks    Status New  Target Date 07/30/20                 Plan - 06/28/20 1009    Clinical Impression Statement Pt arrives with fairly high level of pain. Despite sore knee pt was able to accomplish a great deal of exercise: Sit to stand was 17 sec but with less reliance on UE, pt increased his BERG by 3 points, he increased time on the Nustep and increased resistance with his hip strengthening exercises. Knee was "sore" at end of session. Pt was instructed to use ice x10 min when he got home. pt agreed.     Personal Factors and Comorbidities Age;Fitness;Comorbidity 1;Comorbidity 2;Comorbidity 3+    Comorbidities DM neuropathy, obesity, Lt knee OA    Examination-Activity Limitations Locomotion Level;Transfers;Stairs;Squat;Bend;Lift    Examination-Participation Restrictions Cleaning;Community Activity;Shop;Laundry;Meal Prep    Stability/Clinical Decision Making Stable/Uncomplicated    Rehab Potential Good    PT Frequency 2x / week    PT Duration 8 weeks    PT Treatment/Interventions ADLs/Self Care Home Management;Balance training;Therapeutic exercise;Functional mobility training;Stair training;Gait training;Therapeutic activities;Patient/family education;Neuromuscular re-education    PT Next Visit Plan Nustep; increase time or resistance to help in weight loss goal. Consider seated trunk exercises, balance exercises.    PT Home Exercise Plan Access Code: [email or web address]    FUXNATFTD and Agree with Plan of Care Patient           Patient will benefit from skilled therapeutic intervention in order to improve the following deficits and impairments:  Abnormal gait,Decreased range of motion,Difficulty walking,Obesity,Decreased endurance,Pain,Decreased activity tolerance,Decreased balance,Improper body mechanics,Postural dysfunction,Decreased strength,Decreased mobility  Visit Diagnosis: Muscle weakness (generalized)  Repeated falls     Problem List Patient Active Problem List   Diagnosis Date Noted  . Hypertension associated with type 2 diabetes mellitus ([place]) 05/23/2020  . Chronic kidney disease (CKD), stage III (moderate) ([place]) 12/13/2019  . Carpal tunnel syndrome 12/05/2019  . Coronary artery disease involving native coronary artery of native heart without angina pectoris 11/15/2019  . Elevated creatine kinase 11/15/2019  . Aortic atherosclerosis ([place]) 11/15/2019  . Recurrent Clostridium difficile diarrhea 05/28/2017  . Peripheral neuropathy 03/17/2017  . Diabetes ([place]) 12/11/2016  . Allergic  rhinitis 12/11/2016  . Degenerative arthritis of left knee 01/30/2016  . Class 3 severe obesity with serious comorbidity and body mass index (BMI) of 45.0 to 49.9 in adult ([place]) 01/30/2016  . History of pulmonary embolus (PE) 12/19/2013  . Anxiety state 03/29/2007  . Major depressive disorder 03/29/2007  . OSA (obstructive sleep apnea) 03/29/2007  . Unspecified glaucoma 03/29/2007  . BPH (benign prostatic hyperplasia) 03/29/2007  . OA (osteoarthritis) of knee 03/29/2007  . Essential hypertension 03/29/2007  . History of Bell's palsy 03/29/2007    Lateasha Breuer, PTA 06/28/2020, 10:52 AM  [facility] [address] [place], [place], [address] Phone: [phone]   Fax:  [phone]  Name: Tanner [name] MRN: [id number] Date of Birth: 09-20-1945

## 2020-07-01 ENCOUNTER — Encounter: Payer: Medicare HMO | Admitting: Physical Therapy

## 2020-07-02 ENCOUNTER — Encounter (HOSPITAL_COMMUNITY): Payer: Medicare HMO

## 2020-07-03 ENCOUNTER — Encounter: Payer: Self-pay | Admitting: Physical Therapy

## 2020-07-03 ENCOUNTER — Ambulatory Visit: Payer: Medicare HMO | Admitting: Physical Therapy

## 2020-07-03 ENCOUNTER — Other Ambulatory Visit: Payer: Self-pay

## 2020-07-03 DIAGNOSIS — R296 Repeated falls: Secondary | ICD-10-CM | POA: Diagnosis not present

## 2020-07-03 DIAGNOSIS — M6281 Muscle weakness (generalized): Secondary | ICD-10-CM

## 2020-07-03 NOTE — Therapy (Signed)
[facility] [address] [place], [place], [address] Phone: [phone]   Fax:  [phone]  Physical Therapy Treatment  Patient Details  Name: Tanner [name] MRN: [id number] Date of Birth: 10-Oct-1945 Referring Provider (PT): R26.89 (ICD-10-CM) - [place]   Encounter Date: 07/03/2020   PT End of Session - 07/03/20 1103    Visit Number 5    Date for PT Re-Evaluation 07/30/20    Authorization Type Aetna Medicare    Progress Note Due on Visit 10    PT Start Time 1055    PT Stop Time 1136    PT Time Calculation (min) 41 min    Activity Tolerance Patient tolerated treatment well    Behavior During Therapy [facility] for tasks assessed/performed           Past Medical History:  Diagnosis Date  . Acute kidney failure ([place])   . Arthritis   . Back pain   . BPH (benign prostatic hypertrophy)   . Clostridium difficile infection   . Clotting disorder ([place])   . Depression   . Diabetes ([place]) 12/11/2016  . DVT (deep venous thrombosis) ([place])   . DVT of deep femoral vein, right ([place]) 06/29/2018  . Edema, lower extremity   . High cholesterol   . Hypertension   . Joint pain   . Low back pain potentially associated with spinal stenosis   . Neuromuscular disorder ([place])   . Neuropathy of lower extremity    bilateral  . OSA (obstructive sleep apnea)   . Osteoarthritis   . PE (pulmonary embolism)   . Ventral hernia     Past Surgical History:  Procedure Laterality Date  . EYE SURGERY    . HERNIA REPAIR  1999    There were no vitals filed for this visit.   Subjective Assessment - 07/03/20 1101    Subjective Doing ok, less pain than usual.  4-5/10.  I can't walk outside due to snow.  Trying to walk in driveway but too icy.    Pertinent History Lt knee OA with anticipated TKR in Jan    Limitations Walking    How long can you walk comfortably? 5 min    Patient Stated Goals get stronger and better balance    Currently in  Pain? Yes    Pain Score 4     Pain Location Knee    Pain Orientation Left    Pain Descriptors / Indicators Tightness;Sore    Pain Type Chronic pain    Pain Onset More than a month ago    Pain Frequency Constant    Aggravating Factors  walking    Pain Relieving Factors sitting                             [facility] Adult PT Treatment/Exercise - 07/03/20 0001      Exercises   Exercises Knee/Hip      Knee/Hip Exercises: Aerobic   Nustep L2 x 12' PT present to monitor and discuss symptoms      Knee/Hip Exercises: Standing   Heel Raises Both;2 sets;10 reps    Knee Flexion Strengthening;Right;Left;1 set;20 reps    Knee Flexion Limitations 3lb    Functional Squat 15 reps    Functional Squat Limitations holding edge of TM, VCs to use UEs less    SLS SLS foot on edge of TM 1x10 sec each    Other Standing Knee Exercises march  alt LEs toe tap edge of TM x 1'      Knee/Hip Exercises: Seated   Long Arc Quad Strengthening;Both;10 reps;Weights;3 sets    [employer] Weight 3 lbs.    Ball Squeeze 5 sec hold 10x2    Marching Strengthening;Both;2 sets;20 reps;Weights    Marching Limitations 3lb ankle weights    Sit to [employer] 5 reps                    PT Short Term Goals - 07/03/20 1130      PT SHORT TERM GOAL #1   Title Pt will be ind with HEP    Status Achieved      PT SHORT TERM GOAL #2   Title Pt will demo sit to stand followed by forward reach for cane 5x without LOB    Status Achieved      PT SHORT TERM GOAL #3   Title 5x sit to stand without UE support in 15 sec or less    Status On-going             PT Long Term Goals - 07/03/20 1130      PT LONG TERM GOAL #1   Title Pt will be ind with advanced HEP and understand how to safely progress    Status On-going      PT LONG TERM GOAL #2   Title Pt will improve BERG score to at least 48/56 to demo reduced falls.    Status On-going      PT LONG TERM GOAL #3   Title Pt will report feeling at  least 70% more confidence in his balance with stairs and transfers.    Baseline improved confidence with transfers but not with stairs yet    Status On-going      PT LONG TERM GOAL #4   Title Pt will achieve at least 4+/5 strength in bil LEs and trunk to improve postural support and stability with transfers and stairs    Status On-going                 Plan - 07/03/20 1125    Clinical Impression Statement Pt arrived with 4-5/10 pain in bil knees which is lower than usual.  He has not been able to walk as much secondary to snow/ice.  He increased his resistance for LE weights for hamstrings and quads and his time on NuStep today.  He is able to perform sit to stand without UE use or knees on back of chair for assistance.  PT began standing marhcing and standing in SLS with contralateral foot on step but Pt is only able to tolerate this for 10 sec due to knee pain.  PT introduced counter functional squats and cued more weight into LEs vs hanging through arms.  Pt with consistent work rate throughout session without exacerbation of knee pain.  Due to canceled surgery in Jan, PT had Pt schedule more visits for ongoing time to work toward goals of strength, endurance, weight loss and functional balance.    Comorbidities DM neuropathy, obesity, Lt knee OA    PT Frequency 2x / week    PT Duration 8 weeks    PT Treatment/Interventions ADLs/Self Care Home Management;Balance training;Therapeutic exercise;Functional mobility training;Stair training;Gait training;Therapeutic activities;Patient/family education;Neuromuscular re-education    PT Next Visit Plan add seated trunk exercises, continue sit to stand, NuStep 12+ min, standing ther ex at counter to tolerance including LE strength and balance/weight shifting march/short duration  SLS foot on step    PT Home Exercise Plan Access Code: [email or web address]    Consulted and Agree with Plan of Care Patient           Patient will benefit from skilled  therapeutic intervention in order to improve the following deficits and impairments:     Visit Diagnosis: Muscle weakness (generalized)  Repeated falls     Problem List Patient Active Problem List   Diagnosis Date Noted  . Hypertension associated with type 2 diabetes mellitus ([place]) 05/23/2020  . Chronic kidney disease (CKD), stage III (moderate) ([place]) 12/13/2019  . Carpal tunnel syndrome 12/05/2019  . Coronary artery disease involving native coronary artery of native heart without angina pectoris 11/15/2019  . Elevated creatine kinase 11/15/2019  . Aortic atherosclerosis ([place]) 11/15/2019  . Recurrent Clostridium difficile diarrhea 05/28/2017  . Peripheral neuropathy 03/17/2017  . Diabetes ([place]) 12/11/2016  . Allergic rhinitis 12/11/2016  . Degenerative arthritis of left knee 01/30/2016  . Class 3 severe obesity with serious comorbidity and body mass index (BMI) of 45.0 to 49.9 in adult ([place]) 01/30/2016  . History of pulmonary embolus (PE) 12/19/2013  . Anxiety state 03/29/2007  . Major depressive disorder 03/29/2007  . OSA (obstructive sleep apnea) 03/29/2007  . Unspecified glaucoma 03/29/2007  . BPH (benign prostatic hyperplasia) 03/29/2007  . OA (osteoarthritis) of knee 03/29/2007  . Essential hypertension 03/29/2007  . History of Bell's palsy 03/29/2007    Alene Mires Beuhring 07/03/2020, 11:34 AM  [facility] [address] [place], [place], [address] Phone: [phone]   Fax:  [phone]  Name: Tanner [name] MRN: [id number] Date of Birth: 09/17/1945

## 2020-07-04 ENCOUNTER — Encounter: Payer: Self-pay | Admitting: Hematology & Oncology

## 2020-07-04 ENCOUNTER — Inpatient Hospital Stay: Payer: Medicare HMO | Attending: Hematology & Oncology

## 2020-07-04 ENCOUNTER — Inpatient Hospital Stay (HOSPITAL_BASED_OUTPATIENT_CLINIC_OR_DEPARTMENT_OTHER): Payer: Medicare HMO | Admitting: Hematology & Oncology

## 2020-07-04 VITALS — BP 156/68 | HR 73 | Temp 98.1°F | Resp 16 | Wt 257.0 lb

## 2020-07-04 DIAGNOSIS — I2699 Other pulmonary embolism without acute cor pulmonale: Secondary | ICD-10-CM | POA: Insufficient documentation

## 2020-07-04 DIAGNOSIS — Z7901 Long term (current) use of anticoagulants: Secondary | ICD-10-CM | POA: Insufficient documentation

## 2020-07-04 DIAGNOSIS — Z79899 Other long term (current) drug therapy: Secondary | ICD-10-CM | POA: Insufficient documentation

## 2020-07-04 DIAGNOSIS — I82502 Chronic embolism and thrombosis of unspecified deep veins of left lower extremity: Secondary | ICD-10-CM | POA: Insufficient documentation

## 2020-07-04 DIAGNOSIS — Z86711 Personal history of pulmonary embolism: Secondary | ICD-10-CM

## 2020-07-04 LAB — CBC WITH DIFFERENTIAL (CANCER CENTER ONLY)
Abs Immature Granulocytes: 0.03 10*3/uL (ref 0.00–0.07)
Basophils Absolute: 0.1 10*3/uL (ref 0.0–0.1)
Basophils Relative: 1 %
Eosinophils Absolute: 0.2 10*3/uL (ref 0.0–0.5)
Eosinophils Relative: 3 %
HCT: 46.1 % (ref 39.0–52.0)
Hemoglobin: 14.6 g/dL (ref 13.0–17.0)
Immature Granulocytes: 0 %
Lymphocytes Relative: 22 %
Lymphs Abs: 1.8 10*3/uL (ref 0.7–4.0)
MCH: 28.3 pg (ref 26.0–34.0)
MCHC: 31.7 g/dL (ref 30.0–36.0)
MCV: 89.5 fL (ref 80.0–100.0)
Monocytes Absolute: 0.5 10*3/uL (ref 0.1–1.0)
Monocytes Relative: 6 %
Neutro Abs: 5.6 10*3/uL (ref 1.7–7.7)
Neutrophils Relative %: 68 %
Platelet Count: 199 10*3/uL (ref 150–400)
RBC: 5.15 MIL/uL (ref 4.22–5.81)
RDW: 14.8 % (ref 11.5–15.5)
WBC Count: 8.3 10*3/uL (ref 4.0–10.5)
nRBC: 0 % (ref 0.0–0.2)

## 2020-07-04 LAB — CMP (CANCER CENTER ONLY)
ALT: 26 U/L (ref 0–44)
AST: 28 U/L (ref 15–41)
Albumin: 4 g/dL (ref 3.5–5.0)
Alkaline Phosphatase: 91 U/L (ref 38–126)
Anion gap: 6 (ref 5–15)
BUN: 19 mg/dL (ref 8–23)
CO2: 32 mmol/L (ref 22–32)
Calcium: 9.7 mg/dL (ref 8.9–10.3)
Chloride: 102 mmol/L (ref 98–111)
Creatinine: 1.4 mg/dL — ABNORMAL HIGH (ref 0.61–1.24)
GFR, Estimated: 52 mL/min — ABNORMAL LOW (ref >60)
Glucose, Bld: 92 mg/dL (ref 70–99)
Potassium: 4.1 mmol/L (ref 3.5–5.1)
Sodium: 140 mmol/L (ref 135–145)
Total Bilirubin: 0.5 mg/dL (ref 0.3–1.2)
Total Protein: 6.5 g/dL (ref 6.5–8.1)

## 2020-07-04 NOTE — Progress Notes (Signed)
Hematology and Oncology Follow Up Visit  Tanner [name] [id number] 1945-07-12 75 y.o. 07/04/2020   Principle Diagnosis:  Recurrent pulmonary emboli/left leg thrombus -- Idiopathic  Current Therapy:   Eliquis 5 mg p.o. twice daily - started 06/29/2017   Interim History: Tanner [name] is here today for follow-up.  He was supposed to have surgery for the left knee next week.  Unfortunately, this is been put off for at least 6 weeks.  The orthopedist wants him to lose a little bit more weight.  He actually has lost about 40 pounds since we last saw him.  He is doing nicely with that.  He is having no problems with the Eliquis.  He does have a chronic thrombus in the left leg.  I think if he is going to have knee surgery, he can be off Eliquis for 2 days before surgery and then get back on Eliquis the day after surgery.  He has had no problems with bleeding.  There is no chest wall pain.  He has had no nausea or vomiting.  Has had no change in bowel or bladder habits.  He gets a lot of his medical care at the [facility].  He likes going out there.  He had a pretty tough holiday season because he was not with his wife who had passed on back in Nov 06, 2018.    Overall, his performance status is ECOG 1.    Medications:  Allergies as of 07/04/2020      Reactions   Codeine Sulfate Itching, Nausea Only      Medication List       Accurate as of July 04, 2020 12:14 PM. If you have any questions, ask your nurse or doctor.        acetaminophen 500 MG tablet Commonly known as: TYLENOL Take 1,000 mg by mouth as needed.   apixaban 5 MG Tabs tablet Commonly known as: Eliquis Take 1 tablet (5 mg total) by mouth 2 (two) times daily.   atenolol 25 MG tablet Commonly known as: TENORMIN Take 12.5 mg by mouth daily.   atorvastatin 80 MG tablet Commonly known as: LIPITOR Take 40 mg by mouth daily.   buPROPion 150 MG 12 hr tablet Commonly known as: WELLBUTRIN SR Take 150 mg by mouth  2 (two) times daily.   finasteride 5 MG tablet Commonly known as: PROSCAR Take 5 mg by mouth at bedtime.   furosemide 20 MG tablet Commonly known as: LASIX Take 2 tablets (40 mg total) by mouth 2 (two) times daily. What changed: when to take this   gabapentin 300 MG capsule Commonly known as: NEURONTIN Take 2 tablets (600 mg) three times daily AND 300 MG AT BEDTIME   ketoconazole 2 % shampoo Commonly known as: NIZORAL Apply topically daily.   multivitamins ther. w/minerals Tabs tablet Take 1 tablet by mouth daily.   nortriptyline 10 MG capsule Commonly known as: PAMELOR 2 CAPSULES EVERY NIGHT AT BEDTIME   Ozempic (0.25 or 0.5 MG/DOSE) 2 MG/1.5ML Sopn Generic drug: Semaglutide(0.25 or 0.5MG /DOS) Inject 0.25MG  weekly x 2 weeks, then increase to 0.5MG  weekly for 2 weeks   potassium chloride 10 MEQ tablet Commonly known as: KLOR-CON Take 4 tablets (40 mEq total) by mouth 2 (two) times daily. What changed:   how much to take  when to take this   saccharomyces boulardii 250 MG capsule Commonly known as: FLORASTOR Take 1 capsule (250 mg total) by mouth 2 (two) times daily.   Thera-Gesic  Plus Crea Apply 1 application topically at bedtime. Apply to lower back       Allergies:  Allergies  Allergen Reactions  . Codeine Sulfate Itching and Nausea Only    Past Medical History, Surgical history, Social history, and Family History were reviewed and updated.  Review of Systems: Review of Systems  Constitutional: Negative.   HENT: Negative.   Eyes: Negative.   Respiratory: Negative.   Cardiovascular: Negative.   Gastrointestinal: Negative.   Genitourinary: Negative.   Musculoskeletal: Negative.   Skin: Negative.   Neurological: Negative.   Endo/Heme/Allergies: Negative.   Psychiatric/Behavioral: Negative.      Physical Exam:  vitals were not taken for this visit.   Wt Readings from Last 3 Encounters:  06/25/20 254 lb (115.2 kg)  05/23/20 249 lb (112.9 kg)   05/22/20 250 lb (113.4 kg)    Physical Exam Vitals reviewed.  HENT:     Head: Normocephalic and atraumatic.  Eyes:     Pupils: Pupils are equal, round, and reactive to light.  Cardiovascular:     Rate and Rhythm: Normal rate and regular rhythm.     Heart sounds: Normal heart sounds.  Pulmonary:     Effort: Pulmonary effort is normal.     Breath sounds: Normal breath sounds.  Abdominal:     General: Bowel sounds are normal.     Palpations: Abdomen is soft.  Musculoskeletal:        General: No tenderness or deformity. Normal range of motion.     Cervical back: Normal range of motion.  Lymphadenopathy:     Cervical: No cervical adenopathy.  Skin:    General: Skin is warm and dry.     Findings: No erythema or rash.  Neurological:     Mental Status: He is alert and oriented to person, place, and time.  Psychiatric:        Behavior: Behavior normal.        Thought Content: Thought content normal.        Judgment: Judgment normal.      Lab Results  Component Value Date   WBC 8.3 07/04/2020   HGB 14.6 07/04/2020   HCT 46.1 07/04/2020   MCV 89.5 07/04/2020   PLT 199 07/04/2020   Lab Results  Component Value Date   FERRITIN 80 10/12/2019   IRON 86 10/12/2019   TIBC 297 10/12/2019   UIBC 211 10/12/2019   IRONPCTSAT 29 10/12/2019   Lab Results  Component Value Date   RETICCTPCT 2.5 10/12/2019   RBC 5.15 07/04/2020   No results found for: KPAFRELGTCHN, LAMBDASER, KAPLAMBRATIO Lab Results  Component Value Date   IGGSERUM 549 (L) 04/07/2017   IGMSERUM 144 04/07/2017   Lab Results  Component Value Date   ALBUMINELP 3.7 (L) 04/07/2017   A1GS 0.4 (H) 04/07/2017   A2GS 0.8 04/07/2017   BETS 0.5 04/07/2017   BETA2SER 0.3 04/07/2017   GAMS 0.6 (L) 04/07/2017   SPEI  04/07/2017     Comment:     . One or more serum protein fractions are outside the normal ranges.  No abnormal protein bands are apparent. .      Chemistry      Component Value Date/Time   NA  140 07/04/2020 1135   NA 143 01/23/2020 1603   NA 136 12/29/2013 1356   K 4.1 07/04/2020 1135   K 3.4 12/29/2013 1356   CL 102 07/04/2020 1135   CL 97 (L) 12/29/2013 1356   CO2 32 07/04/2020  1135   CO2 33 12/29/2013 1356   BUN 19 07/04/2020 1135   BUN 16 01/23/2020 1603   BUN 15 12/29/2013 1356   CREATININE 1.40 (H) 07/04/2020 1135   CREATININE 1.2 12/29/2013 1356   GLU 84 11/07/2014 0000      Component Value Date/Time   CALCIUM 9.7 07/04/2020 1135   CALCIUM 8.9 12/29/2013 1356   ALKPHOS 91 07/04/2020 1135   ALKPHOS 59 12/29/2013 1356   AST 28 07/04/2020 1135   ALT 26 07/04/2020 1135   ALT 27 12/29/2013 1356   BILITOT 0.5 07/04/2020 1135      Impression and Plan: Tanner [name] is a very pleasant 75 yo caucasian gentleman with recurrent pulmonary embolism/thromboembolic disease.  He is on lifelong Eliquis.  I do not think we have to do any scans on him.  I do not think we need any Dopplers right now.  He has a chronic nonocclusive thrombus in his left leg.  If he ever has surgery for the left knee, I do not see any problems with him having this.  I would to stop his Eliquis for a couple days.  I suppose that to make things safer for him, we can always have a temporary IVC filter placed.  I told him to make sure he lets [name] know if he is going to have knee surgery.    We will go ahead and plan to see him back in 6 months.  I think this would be reasonable at this point. We can certainly see him sooner if need be.    Volanda Napoleon, MD 1/20/202212:14 PM

## 2020-07-08 ENCOUNTER — Ambulatory Visit: Admit: 2020-07-08 | Payer: Medicare HMO | Admitting: Orthopedic Surgery

## 2020-07-08 SURGERY — ARTHROPLASTY, KNEE, TOTAL
Anesthesia: Spinal | Site: Knee | Laterality: Left

## 2020-07-18 ENCOUNTER — Other Ambulatory Visit (INDEPENDENT_AMBULATORY_CARE_PROVIDER_SITE_OTHER): Payer: Self-pay | Admitting: Family Medicine

## 2020-07-18 ENCOUNTER — Other Ambulatory Visit (INDEPENDENT_AMBULATORY_CARE_PROVIDER_SITE_OTHER): Payer: Self-pay | Admitting: Medical

## 2020-07-18 NOTE — Telephone Encounter (Signed)
Reviewed active medications.  Parameters for refill met per departmental refill protocol . Refill sent electronically to pharmacy.  Order forwarded to provider for [facility]

## 2020-07-19 ENCOUNTER — Other Ambulatory Visit: Payer: Self-pay

## 2020-07-19 ENCOUNTER — Ambulatory Visit: Payer: Medicare HMO | Attending: Family Medicine | Admitting: Physical Therapy

## 2020-07-19 ENCOUNTER — Encounter: Payer: Self-pay | Admitting: Physical Therapy

## 2020-07-19 DIAGNOSIS — M6281 Muscle weakness (generalized): Secondary | ICD-10-CM | POA: Diagnosis not present

## 2020-07-19 DIAGNOSIS — R296 Repeated falls: Secondary | ICD-10-CM | POA: Diagnosis not present

## 2020-07-19 NOTE — Therapy (Signed)
[facility] [address] [place], [place], [address] Phone: [phone]   Fax:  [phone]  Physical Therapy Treatment  Patient Details  Name: Tanner [name] MRN: [id number] Date of Birth: [birth year] Referring Provider (PT): R26.89 (ICD-10-CM) - [place]   Encounter Date: 07/19/2020   PT End of Session - 07/19/20 1022    Visit Number 6    Date for PT Re-Evaluation 07/30/20    Authorization Type Aetna Medicare    Progress Note Due on Visit 10    PT Start Time 1015    PT Stop Time 1053    PT Time Calculation (min) 38 min    Activity Tolerance Patient tolerated treatment well    Behavior During Therapy [facility] for tasks assessed/performed           Past Medical History:  Diagnosis Date  . Acute kidney failure ([place])   . Arthritis   . Back pain   . BPH (benign prostatic hypertrophy)   . Clostridium difficile infection   . Clotting disorder ([place])   . Depression   . Diabetes ([place]) 12/11/2016  . DVT (deep venous thrombosis) ([place])   . DVT of deep femoral vein, right ([place]) 06/29/2018  . Edema, lower extremity   . High cholesterol   . Hypertension   . Joint pain   . Low back pain potentially associated with spinal stenosis   . Neuromuscular disorder ([place])   . Neuropathy of lower extremity    bilateral  . OSA (obstructive sleep apnea)   . Osteoarthritis   . PE (pulmonary embolism)   . Ventral hernia     Past Surgical History:  Procedure Laterality Date  . EYE SURGERY    . HERNIA REPAIR  1999    There were no vitals filed for this visit.                      [place] Adult PT Treatment/Exercise - 07/19/20 0001      Knee/Hip Exercises: Aerobic   Nustep L3 x 12' PT present to monitor and discuss symptoms      Knee/Hip Exercises: Seated   Long Arc Quad Strengthening;Both;2 sets;10 reps;Weights    Long Arc Quad Weight 3 lbs.    Ball Squeeze 5 sec hold 10x2    Marching Strengthening;Both;2  sets;20 reps;Weights    Marching Limitations 3lb ankle weights    Abd/Adduction Limitations Ankle rocker board warm up x4 min    Sit to [employer] 2 sets;5 reps;without UE support                    PT Short Term Goals - 07/03/20 1130      PT SHORT TERM GOAL #1   Title Pt will be ind with HEP    Status Achieved      PT SHORT TERM GOAL #2   Title Pt will demo sit to stand followed by forward reach for cane 5x without LOB    Status Achieved      PT SHORT TERM GOAL #3   Title 5x sit to stand without UE support in 15 sec or less    Status On-going             PT Long Term Goals - 07/03/20 1130      PT LONG TERM GOAL #1   Title Pt will be ind with advanced HEP and understand how to safely progress    Status On-going  PT LONG TERM GOAL #2   Title Pt will improve BERG score to at least 48/56 to demo reduced falls.    Status On-going      PT LONG TERM GOAL #3   Title Pt will report feeling at least 70% more confidence in his balance with stairs and transfers.    Baseline improved confidence with transfers but not with stairs yet    Status On-going      PT LONG TERM GOAL #4   Title Pt will achieve at least 4+/5 strength in bil LEs and trunk to improve postural support and stability with transfers and stairs    Status On-going                 Plan - 07/19/20 1022    Clinical Impression Statement Pt's Lt knee pain slightly higher today than usual but this did not impact his ability to perform LE strenthening exercises in sitting, pt did not enjoy standing so we did not do weightbearing exercises today. . Pt did complete longer duration and increased resistance on the Nustep to aide in weight loss and for knee ROM & strength in a non-weightbearing manner. Pt has not been able to do much walking due to the bad weather ( raining today.) Pt will see Dr.Smith on Tuesday.    Personal Factors and Comorbidities Age;Fitness;Comorbidity 1;Comorbidity 2;Comorbidity 3+     Comorbidities DM neuropathy, obesity, Lt knee OA    Examination-Activity Limitations Locomotion Level;Transfers;Stairs;Squat;Bend;Lift    Examination-Participation Restrictions Cleaning;Community Activity;Shop;Laundry;Meal Prep    Stability/Clinical Decision Making Stable/Uncomplicated    Rehab Potential Good    PT Frequency 2x / week    PT Duration 8 weeks    PT Treatment/Interventions ADLs/Self Care Home Management;Balance training;Therapeutic exercise;Functional mobility training;Stair training;Gait training;Therapeutic activities;Patient/family education;Neuromuscular re-education    PT Next Visit Plan ERO visit next , follow up with how visit with Dr Tamala Julian went. Continue with hip & knee strength as able.    PT Home Exercise Plan Access Code: [email or web address] and Agree with Plan of Care Patient           Patient will benefit from skilled therapeutic intervention in order to improve the following deficits and impairments:  Abnormal gait,Decreased range of motion,Difficulty walking,Obesity,Decreased endurance,Pain,Decreased activity tolerance,Decreased balance,Improper body mechanics,Postural dysfunction,Decreased strength,Decreased mobility  Visit Diagnosis: Muscle weakness (generalized)  Repeated falls     Problem List Patient Active Problem List   Diagnosis Date Noted  . Hypertension associated with type 2 diabetes mellitus ([place]) 05/23/2020  . Chronic kidney disease (CKD), stage III (moderate) ([place]) 12/13/2019  . Carpal tunnel syndrome 12/05/2019  . Coronary artery disease involving native coronary artery of native heart without angina pectoris 11/15/2019  . Elevated creatine kinase 11/15/2019  . Aortic atherosclerosis ([place]) 11/15/2019  . Recurrent Clostridium difficile diarrhea 05/28/2017  . Peripheral neuropathy 03/17/2017  . Diabetes ([place]) 12/11/2016  . Allergic rhinitis 12/11/2016  . Degenerative arthritis of left knee 01/30/2016  . Class 3 severe obesity with  serious comorbidity and body mass index (BMI) of 45.0 to 49.9 in adult ([place]) 01/30/2016  . History of pulmonary embolus (PE) 12/19/2013  . Anxiety state 03/29/2007  . Major depressive disorder 03/29/2007  . OSA (obstructive sleep apnea) 03/29/2007  . Unspecified glaucoma 03/29/2007  . BPH (benign prostatic hyperplasia) 03/29/2007  . OA (osteoarthritis) of knee 03/29/2007  . Essential hypertension 03/29/2007  . History of Bell's palsy 03/29/2007    Clatie Kessen, PTA 07/19/2020, 10:55 AM    [facility] [address] [place], [place], [address] Phone: [phone]   Fax:  [phone]  Name: Tanner [name] MRN: [id number] Date of Birth: 1946/01/16

## 2020-07-22 NOTE — Progress Notes (Unsigned)
[name] [place] Phone: [phone] Subjective:   Tanner [name], am serving as a scribe for Dr. Hulan Saas.  This visit occurred during the SARS-CoV-2 public health emergency.  Safety protocols were in place, including screening questions prior to the visit, additional usage of staff PPE, and extensive cleaning of exam room while observing appropriate contact time as indicated for disinfecting solutions.    I'm seeing this patient by the request  of:  Hoyt Koch, MD  CC: Left knee pain  CHE:[id number]   05/22/2020 Patient given another steroid injection today.  Tolerated procedure well.  Discussed with patient that we can repeat injections every 8 to 10 weeks if necessary.  Patient is going to be referred to another orthopedic surgeon to discuss potential surgical intervention.  Has lost a good amount of weight and BMI is at 41.  Hopefully patient will continue to lose some weight.  We know that the holidays can be hard.  Follow-up with me again in 10 weeks  Update 07/23/2020 Tanner [name] is a 75 y.o. male coming in with complaint of left knee pain. Patient has been doing rehab, was seen by healthy weight and wellness as well as Dr. Mayer Camel. Patient states that l knee is hurting like "Crazy" and that it is getting worse. Did have surgery scheduled but cancelled because he needs to lose ten more pounds.  Patient states that the pain is severe and having more instability which is putting more pressure on his hip.      Past Medical History:  Diagnosis Date  . Acute kidney failure ([place])   . Arthritis   . Back pain   . BPH (benign prostatic hypertrophy)   . Clostridium difficile infection   . Clotting disorder ([place])   . Depression   . Diabetes ([place]) 12/11/2016  . DVT (deep venous thrombosis) ([place])   . DVT of deep femoral vein, right ([place]) 06/29/2018  . Edema, lower extremity   . High cholesterol   .  Hypertension   . Joint pain   . Low back pain potentially associated with spinal stenosis   . Neuromuscular disorder ([place])   . Neuropathy of lower extremity    bilateral  . OSA (obstructive sleep apnea)   . Osteoarthritis   . PE (pulmonary embolism)   . Ventral hernia    Past Surgical History:  Procedure Laterality Date  . EYE SURGERY    . HERNIA REPAIR  1999   Social History   Socioeconomic History  . Marital status: Widowed    Spouse name: Not on file  . Number of children: 2  . Years of education: Not on file  . Highest education level: Not on file  Occupational History  . Occupation: Retired  Tobacco Use  . Smoking status: Former Smoker    Packs/day: 2.00    Years: 33.00    Pack years: 66.00    Types: Cigarettes    Start date: 08/30/1968    Quit date: 07/02/2001    Years since quitting: 19.0  . Smokeless tobacco: Never Used  . Tobacco comment: quit 12 years ago  Vaping Use  . Vaping Use: Never used  Substance and Sexual Activity  . Alcohol use: Yes    Comment: occasional  . Drug use: No  . Sexual activity: Not Currently  Other Topics Concern  . 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    Social Determinants of Health   Financial Resource Strain: Low Risk   . Difficulty of Paying Living Expenses: Not very hard  Food Insecurity: Not on file  Transportation Needs: Not on file  Physical Activity: Not on file  Stress: Not on file  Social Connections: Not on file   Allergies  Allergen Reactions  . Codeine Sulfate Itching and Nausea Only   Family History  Problem Relation Age of Onset  . Diabetes Mother   . Pneumonia Mother   . Alzheimer's disease Mother   . Hyperlipidemia Mother   . Thyroid disease Mother   . Depression Mother   . Other Father 21       Drowned on boating accident  . Colon  cancer Neg Hx   . Colon polyps Neg Hx     Current Outpatient Medications (Endocrine & Metabolic):  .  Semaglutide,0.25 or 0.5MG /DOS, (OZEMPIC, 0.25 OR 0.5 MG/DOSE,) 2 MG/1.5ML SOPN, Inject 0.25MG  weekly x 2 weeks, then increase to 0.5MG  weekly for 2 weeks  Current Outpatient Medications (Cardiovascular):  .  atenolol (TENORMIN) 25 MG tablet, Take 12.5 mg by mouth daily. Marland Kitchen  atorvastatin (LIPITOR) 80 MG tablet, Take 40 mg by mouth daily. .  furosemide (LASIX) 20 MG tablet, Take 2 tablets (40 mg total) by mouth 2 (two) times daily. (Patient taking differently: Take 40 mg by mouth daily.)   Current Outpatient Medications (Analgesics):  .  acetaminophen (TYLENOL) 500 MG tablet, Take 1,000 mg by mouth as needed.  Current Outpatient Medications (Hematological):  .  apixaban (ELIQUIS) 5 MG TABS tablet, Take 1 tablet (5 mg total) by mouth 2 (two) times daily.  Current Outpatient Medications (Other):  Marland Kitchen  buPROPion (WELLBUTRIN SR) 150 MG 12 hr tablet, Take 150 mg by mouth 2 (two) times daily. .  finasteride (PROSCAR) 5 MG tablet, Take 5 mg by mouth at bedtime. .  gabapentin (NEURONTIN) 300 MG capsule, Take 2 tablets (600 mg) three times daily AND 300 MG AT BEDTIME .  ketoconazole (NIZORAL) 2 % shampoo, Apply topically daily. .  Menthol-Methyl Salicylate (THERA-GESIC PLUS) CREA, Apply 1 application topically at bedtime. Apply to lower back .  Multiple Vitamins-Minerals (MULTIVITAMINS THER. W/MINERALS) TABS, Take 1 tablet by mouth daily. .  nortriptyline (PAMELOR) 10 MG capsule, 2 CAPSULES EVERY NIGHT AT BEDTIME .  potassium chloride (KLOR-CON) 10 MEQ tablet, Take 4 tablets (40 mEq total) by mouth 2 (two) times daily. (Patient taking differently: Take 20 mEq by mouth daily.) .  saccharomyces boulardii (FLORASTOR) 250 MG capsule, Take 1 capsule (250 mg total) by mouth 2 (two) times daily.   Reviewed prior external information including notes and imaging from  primary care provider As well as notes  that were available from care everywhere and other healthcare systems.  Past medical history, social, surgical and family history all reviewed in electronic medical record.  No pertanent information unless stated regarding to the chief complaint.   Review of Systems:  No headache, visual changes, nausea, vomiting, diarrhea, constipation, dizziness, abdominal pain, skin rash, fevers, chills, night sweats, weight loss, swollen lymph nodes, body aches, joint swelling, chest pain, shortness of breath, mood changes. POSITIVE muscle aches  Objective  Blood pressure 132/70, pulse 72, height 5\' 5"  (1.651 m), weight 252 lb (114.3 kg), SpO2 92 %.   General: No apparent distress  alert and oriented x3 mood and affect normal, dressed appropriately.  HEENT: Asymmetry of the face noted. Respiratory: Patient's speak in full sentences and does not appear short of breath  Cardiovascular: Trace lower extremity edema, non tender, no erythema  Gait severely antalgic walking with the aid of a walking stick Patient seems severely tender to palpation.  Did not get significant testing today.   Impression and Recommendations:     The above documentation has been reviewed and is accurate and complete Lyndal Pulley, DO

## 2020-07-23 ENCOUNTER — Other Ambulatory Visit: Payer: Self-pay

## 2020-07-23 ENCOUNTER — Encounter: Payer: Self-pay | Admitting: Internal Medicine

## 2020-07-23 ENCOUNTER — Encounter: Payer: Self-pay | Admitting: Family Medicine

## 2020-07-23 ENCOUNTER — Ambulatory Visit (INDEPENDENT_AMBULATORY_CARE_PROVIDER_SITE_OTHER): Payer: Medicare HMO | Admitting: Internal Medicine

## 2020-07-23 ENCOUNTER — Ambulatory Visit (INDEPENDENT_AMBULATORY_CARE_PROVIDER_SITE_OTHER): Payer: Medicare HMO | Admitting: Family Medicine

## 2020-07-23 DIAGNOSIS — M1712 Unilateral primary osteoarthritis, left knee: Secondary | ICD-10-CM | POA: Diagnosis not present

## 2020-07-23 DIAGNOSIS — N1831 Chronic kidney disease, stage 3a: Secondary | ICD-10-CM

## 2020-07-23 DIAGNOSIS — I700 Atherosclerosis of aorta: Secondary | ICD-10-CM | POA: Diagnosis not present

## 2020-07-23 DIAGNOSIS — I10 Essential (primary) hypertension: Secondary | ICD-10-CM | POA: Diagnosis not present

## 2020-07-23 DIAGNOSIS — G4733 Obstructive sleep apnea (adult) (pediatric): Secondary | ICD-10-CM | POA: Diagnosis not present

## 2020-07-23 DIAGNOSIS — E1169 Type 2 diabetes mellitus with other specified complication: Secondary | ICD-10-CM

## 2020-07-23 MED ORDER — OZEMPIC (0.25 OR 0.5 MG/DOSE) 2 MG/1.5ML ~~LOC~~ SOPN: 0.5000 mg | PEN_INJECTOR | SUBCUTANEOUS | 3 refills | Status: DC

## 2020-07-23 NOTE — Assessment & Plan Note (Signed)
Ozempic refilled per patient request and last HgA1c 5.4 and repeat every 6-12 months not due today.

## 2020-07-23 NOTE — Assessment & Plan Note (Signed)
Severe arthritic changes of the knee.  Patient is going to follow-up with an orthopedic surgeon in 1 week.  I do believe that patient is now lost enough weight but hopefully can have surgical intervention and should do relatively well.  Patient's BMI is a little elevated but I do think he will do well and follow-up with me again only as needed.

## 2020-07-23 NOTE — Assessment & Plan Note (Signed)
Stable GFR

## 2020-07-23 NOTE — Assessment & Plan Note (Signed)
BP at goal. Recent Creatinine and GFR stable. Continue lasix.

## 2020-07-23 NOTE — Patient Instructions (Signed)
You are fine to have the surgery and you do not need labs today.  We will fax them the form.

## 2020-07-23 NOTE — Progress Notes (Signed)
   Subjective:   Patient ID: Tanner [name], male    DOB: 1945/06/26, 75 y.o.   MRN: [id number]  HPI The patient is a 75 YO man coming in for pre-op clearance. He has been working on losing weight to have left knee replacement surgery. Has lost about 30 pounds since last summer intentionally. He did see sports medicine today and they feel he is appropriate candidate now. Needs clearance. Denies chest pains or SOB. Activity is limited due to knee pain. Denies changes to medications. Does have history of PE and takes eliquis BID without missing. Hx CKD stage 3 which is stable on recent labs reviewed in computer and recent HgA1c down to 5.5. Recent visit with oncology and they approved him to stop eliquis 2 days prior to surgery and then resume day after surgery. EKG from 4/21 reviewed during visit and unchanged from prior and no indication for repeat today.  PMH, [facility], social history reviewed and updated  Review of Systems  Constitutional: Negative.   HENT: Negative.   Eyes: Negative.   Respiratory: Negative for cough, chest tightness and shortness of breath.   Cardiovascular: Negative for chest pain, palpitations and leg swelling.  Gastrointestinal: Negative for abdominal distention, abdominal pain, constipation, diarrhea, nausea and vomiting.  Musculoskeletal: Positive for arthralgias, gait problem and myalgias.  Skin: Negative.   Psychiatric/Behavioral: Negative.     Objective:  Physical Exam Constitutional:      Appearance: He is well-developed and well-nourished. He is obese.  HENT:     Head: Normocephalic and atraumatic.  Eyes:     Extraocular Movements: EOM normal.  Cardiovascular:     Rate and Rhythm: Normal rate and regular rhythm.  Pulmonary:     Effort: Pulmonary effort is normal. No respiratory distress.     Breath sounds: Normal breath sounds. No wheezing or rales.  Abdominal:     General: Bowel sounds are normal. There is no distension.     Palpations: Abdomen is soft.      Tenderness: There is no abdominal tenderness. There is no rebound.  Musculoskeletal:        General: No edema.     Cervical back: Normal range of motion.  Skin:    General: Skin is warm and dry.  Neurological:     Mental Status: He is alert and oriented to person, place, and time.     Coordination: Coordination abnormal.     Comments: Cane for ambulation  Psychiatric:        Mood and Affect: Mood and affect normal.     Vitals:   07/23/20 1350  BP: 132/86  Pulse: 75  Resp: 18  Temp: 97.7 F (36.5 C)  TempSrc: Oral  SpO2: 94%  Weight: 253 lb (114.8 kg)  Height: 5\' 5"  (1.651 m)    This visit occurred during the SARS-CoV-2 public health emergency.  Safety protocols were in place, including screening questions prior to the visit, additional usage of staff PPE, and extensive cleaning of exam room while observing appropriate contact time as indicated for disinfecting solutions.   Assessment & Plan:

## 2020-07-23 NOTE — Patient Instructions (Addendum)
Good to see you. Talk to Dr. Mayer Camel I think you have lost enough weight to have the surgery If not he will be able to do the injection. Call or write with any questions.

## 2020-07-23 NOTE — Assessment & Plan Note (Signed)
Using CPAP and stable.

## 2020-07-23 NOTE — Assessment & Plan Note (Signed)
Needs replacement due to pain and limited mobility related to same. Would recommend to proceed with medical and cardiac clearance. Able to hold eliquis 2 days prior to surgery and resume day after surgery according to heme/onc and recent visit with them.

## 2020-07-23 NOTE — Assessment & Plan Note (Signed)
Taking statin, EKG within 1 year without change. No chest pains so no indication for further cardiac workup prior to surgery.

## 2020-07-24 ENCOUNTER — Ambulatory Visit (INDEPENDENT_AMBULATORY_CARE_PROVIDER_SITE_OTHER): Payer: Medicare HMO | Admitting: Family Medicine

## 2020-07-24 VITALS — BP 127/70 | HR 65 | Temp 97.4°F | Ht 65.5 in | Wt 245.0 lb

## 2020-07-24 DIAGNOSIS — E65 Localized adiposity: Secondary | ICD-10-CM | POA: Diagnosis not present

## 2020-07-24 DIAGNOSIS — M170 Bilateral primary osteoarthritis of knee: Secondary | ICD-10-CM | POA: Diagnosis not present

## 2020-07-24 DIAGNOSIS — R600 Localized edema: Secondary | ICD-10-CM | POA: Diagnosis not present

## 2020-07-24 DIAGNOSIS — Z6841 Body Mass Index (BMI) 40.0 and over, adult: Secondary | ICD-10-CM | POA: Diagnosis not present

## 2020-07-25 NOTE — Progress Notes (Signed)
Chief Complaint:   OBESITY Breydan is here to discuss his progress with his obesity treatment plan along with follow-up of his obesity related diagnoses.   Today's visit was #: 12 Starting weight: 283 lbs Starting date: 10/12/2019 Today's weight: 245 lbs Today's date: 07/24/2020 Total lbs lost to date: 38 lbs Body mass index is 40.15 kg/m.  Total weight loss percentage to date: -13.43%  Interim History: Lorenza is not taking Lasix on appointment days. Nutrition Plan: Category 1 Plan for 30-40% of the time. Anti-obesity medications: Ozempic. Reported side effects: None. Activity: Increased activity.  Assessment/Plan:   1. Lower extremity edema Discussed getting Lasix dose on morning of Ortho appointment to decrease water weight.  2. Primary osteoarthritis of both knees Will hopefully qualify for surgery next week.  3. Visceral obesity Current visceral fat rating: 29. Visceral fat rating should be < 13. Visceral adipose tissue is a hormonally active component of total body fat. This body composition phenotype is associated with medical disorders such as metabolic syndrome, cardiovascular disease and several malignancies including prostate, breast, and colorectal cancers. Starting goal: Lose 7-10% of starting weight.   4. Class 3 severe obesity with serious comorbidity and body mass index (BMI) of 40.0 to 44.9 in adult, unspecified obesity type [facility])  Course: Andrell is currently in the action stage of change. As such, his goal is to continue with weight loss efforts.   Nutrition goals: He has agreed to the Category 1 Plan.   Exercise goals: As is.  Behavioral modification strategies: increasing lean protein intake, decreasing simple carbohydrates and increasing vegetables.  Sherri has agreed to follow-up with our clinic in 2 weeks. He was informed of the importance of frequent follow-up visits to maximize his success with intensive lifestyle modifications for his multiple  health conditions.   Objective:   Blood pressure 127/70, pulse 65, temperature (!) 97.4 F (36.3 C), temperature source Oral, height 5' 5.5" (1.664 m), weight 245 lb (111.1 kg), SpO2 91 %. Body mass index is 40.15 kg/m.  General: Cooperative, alert, well developed, in no acute distress. HEENT: Conjunctivae and lids unremarkable. Cardiovascular: Regular rhythm.  Lungs: Normal work of breathing. Neurologic: No focal deficits.   Lab Results  Component Value Date   CREATININE 1.40 (H) 07/04/2020   BUN 19 07/04/2020   NA 140 07/04/2020   K 4.1 07/04/2020   CL 102 07/04/2020   CO2 32 07/04/2020   Lab Results  Component Value Date   ALT 26 07/04/2020   AST 28 07/04/2020   ALKPHOS 91 07/04/2020   BILITOT 0.5 07/04/2020   Lab Results  Component Value Date   HGBA1C 5.4 04/15/2020   HGBA1C 8.1 (H) 10/12/2019   HGBA1C 7.5 (H) 07/05/2019   HGBA1C 5.9 (H) 01/04/2019   HGBA1C 6.2 09/15/2017   Lab Results  Component Value Date   INSULIN 19.4 10/12/2019   Lab Results  Component Value Date   TSH 3.050 10/12/2019   Lab Results  Component Value Date   CHOL 85 04/15/2020   HDL 33 (A) 04/15/2020   LDLCALC 43 04/15/2020   LDLDIRECT 65.9 09/13/2007   TRIG 122 04/15/2020   CHOLHDL 3.6 10/12/2019   Lab Results  Component Value Date   WBC 8.3 07/04/2020   HGB 14.6 07/04/2020   HCT 46.1 07/04/2020   MCV 89.5 07/04/2020   PLT 199 07/04/2020   Lab Results  Component Value Date   IRON 86 10/12/2019   TIBC 297 10/12/2019   FERRITIN 80 10/12/2019  Obesity Behavioral Intervention:   Approximately 15 minutes were spent on the discussion below.  ASK: We discussed the diagnosis of obesity with [name] today and Cheron agreed to give [name] permission to discuss obesity behavioral modification therapy today.  ASSESS: Esias has the diagnosis of obesity and his BMI today is 40.9. [place] is in the action stage of change.   ADVISE: Kregg was educated on the multiple health  risks of obesity as well as the benefit of weight loss to improve his health. He was advised of the need for long term treatment and the importance of lifestyle modifications to improve his current health and to decrease his risk of future health problems.  AGREE: Multiple dietary modification options and treatment options were discussed and Kaveon agreed to follow the recommendations documented in the above note.  ARRANGE: Cheyne was educated on the importance of frequent visits to treat obesity as outlined per CMS and USPSTF guidelines and agreed to schedule his next follow up appointment today.  Attestation Statements:   Reviewed by clinician on day of visit: allergies, medications, problem list, medical history, surgical history, family history, social history, and previous encounter notes.  I, Water quality scientist, CMA, am acting as transcriptionist for Briscoe Deutscher, DO  I have reviewed the above documentation for accuracy and completeness, and I agree with the above. Briscoe Deutscher, DO

## 2020-07-26 ENCOUNTER — Other Ambulatory Visit: Payer: Self-pay

## 2020-07-26 ENCOUNTER — Telehealth: Payer: Self-pay | Admitting: Pharmacist

## 2020-07-26 ENCOUNTER — Encounter: Payer: Self-pay | Admitting: Physical Therapy

## 2020-07-26 ENCOUNTER — Ambulatory Visit: Payer: Medicare HMO | Admitting: Physical Therapy

## 2020-07-26 DIAGNOSIS — M6281 Muscle weakness (generalized): Secondary | ICD-10-CM | POA: Diagnosis not present

## 2020-07-26 DIAGNOSIS — R296 Repeated falls: Secondary | ICD-10-CM | POA: Diagnosis not present

## 2020-07-26 NOTE — Therapy (Signed)
[facility] [address], [place], [place], [address] Phone: [phone]   Fax:  [phone]  Physical Therapy Treatment  Patient Details  Name: Tanner [name] MRN: [id number] Date of Birth: 29-Oct-1945 Referring Provider (PT): Briscoe Deutscher, DO   Encounter Date: 07/26/2020   PT End of Session - 07/26/20 1020    Visit Number 7    Date for PT Re-Evaluation 07/30/20    Authorization Type A[employer]na Medicare    Progress Note Due on Visit 10    PT Start Time 1015    PT Stop Time 1100    PT Time Calculation (min) 45 min    Activity Tolerance Patient tolerated treatment well;Patient limited by pain    Behavior During Therapy [facility] for tasks assessed/performed           Past Medical History:  Diagnosis Date  . Acute kidney failure ([place])   . Arthritis   . Back pain   . BPH (benign prostatic hypertrophy)   . Clostridium difficile infection   . Clotting disorder ([place])   . Depression   . Diabetes ([place]) 12/11/2016  . DVT (deep venous thrombosis) ([place])   . DVT of deep femoral vein, right ([place]) 06/29/2018  . Edema, lower extremity   . High cholesterol   . Hypertension   . Joint pain   . Low back pain potentially associated with spinal stenosis   . Neuromuscular disorder ([place])   . Neuropathy of lower extremity    bilateral  . OSA (obstructive sleep apnea)   . Osteoarthritis   . PE (pulmonary embolism)   . Ventral hernia     Past Surgical History:  Procedure Laterality Date  . EYE SURGERY    . HERNIA REPAIR  1999    There were no vitals filed for this visit.   Subjective Assessment - 07/26/20 1018    Subjective My Lt knee hasn't improved with PT. I see the surgeon again next week.  I got a pre-surgical clearance.  My balance confidence has improved about 15% since starting PT.    Pertinent History Lt knee OA with anticipated TKR in Jan    Limitations Walking    How long can you walk comfortably? 5 min    Patient  Stated Goals get stronger and better balance    Currently in Pain? Yes    Pain Score 8     Pain Location Knee    Pain Orientation Left    Pain Descriptors / Indicators Tightness;Sore    Pain Type Chronic pain    Pain Onset More than a month ago    Pain Frequency Constant    Aggravating Factors  walking    Pain Relieving Factors sitting              [facility] PT Assessment - 07/26/20 0001      Assessment   Medical Diagnosis R26.89 (ICD-10-CM) - Imbalance    Referring Provider (PT) Briscoe Deutscher, DO    Onset Date/Surgical Date --   2 years   Next MD Visit next week    Prior Therapy no      Precautions   Precautions [place] residence    Living Arrangements Alone    Type of [place] to enter    Entrance Stairs-Number of Steps 5    Entrance Stairs-Rails Right    [place] One level  Home Equipment Cane - single point      Strength   Overall Strength Comments weak core with diastatsis, Rt hip 5/5    Strength Assessment Site Knee;Hip    Right/Left Hip Left    Left Hip Flexion 4/5    Left Hip Extension 4/5    Left Hip ABduction 4/5    Right/Left Knee Right;Left    Right Knee Flexion 4+/5    Right Knee Extension 4+/5    Left Knee Flexion 4+/5   pain   Left Knee Extension 4+/5   pain     Ambulation/Gait   Ambulation/Gait Yes    Assistive device Straight cane    Gait Pattern Step-through pattern;Wide base of support    Stairs Yes    Stairs Assistance 6: Modified independent (Device/Increase time)    Stair Management Technique One rail Right;Step to pattern    Number of Stairs 4    Height of Stairs 6      Standardized Balance Assessment   Five times sit to stand comments  12 sec, using UEs on chair      Berg Balance Test   Sit to Stand Able to stand without using hands and stabilize independently    Standing Unsupported Able to stand safely 2 minutes    Sitting with Back Unsupported but  Feet Supported on Floor or Stool Able to sit safely and securely 2 minutes    Stand to Sit Sits safely with minimal use of hands    Transfers Able to transfer safely, minor use of hands    Standing Unsupported with Eyes Closed Able to stand 10 seconds safely    Standing Unsupported with Feet Together Able to place feet together independently and stand 1 minute safely    From Standing, Reach Forward with Outstretched Arm Can reach confidently >25 cm (10")    From Standing Position, Pick up Object from Floor Able to pick up shoe safely and easily    From Standing Position, Turn to Look Behind Over each Shoulder Looks behind one side only/other side shows less weight shift    Turn 360 Degrees Able to turn 360 degrees safely one side only in 4 seconds or less    Standing Unsupported, Alternately Place Feet on Step/Stool Able to stand independently and safely and complete 8 steps in 20 seconds    Standing Unsupported, One Foot in Front Able to plae foot ahead of the other independently and hold 30 seconds    Standing on One Leg Tries to lift leg/unable to hold 3 seconds but remains standing independently    Total Score 50    Berg comment: 50/56      Timed Up and Go Test   TUG Normal TUG    Normal TUG (seconds) 12    TUG Comments with Rt UE cane                         [facility] Adult PT Treatment/Exercise - 07/26/20 0001      Exercises   Exercises Knee/Hip      Knee/Hip Exercises: Aerobic   Nustep L3 x 10' PT present to discuss progress and goals      Knee/Hip Exercises: Seated   [place] Quad Strengthening;Both;2 sets;10 reps;Weights    [place] Quad Weight 3 lbs.    [place] Quad Limitations VCs for pause and slow eccentric control    Other Seated Knee/Hip Exercises heel/toe raises x 20  Marching Strengthening;Right;Left;Weights;20 reps;2 sets    Marching Limitations 3lb ankle weights    Sit to Sand 2 sets;5 reps;with UE support;without UE support   2nd set without UEs,  improved ability to perform with LEs                   PT Short Term Goals - 07/26/20 1020      PT SHORT TERM GOAL #1   Title Pt will be ind with HEP    Status Achieved      PT SHORT TERM GOAL #2   Title Pt will demo sit to stand followed by forward reach for cane 5x without LOB    Status Achieved      PT SHORT TERM GOAL #3   Title 5x sit to stand without UE support in 15 sec or less             PT Long Term Goals - 07/26/20 1021      PT LONG TERM GOAL #1   Title Pt will be ind with advanced HEP and understand how to safely progress    Status On-going      PT LONG TERM GOAL #2   Title Pt will improve BERG score to at least 48/56 to demo reduced falls.    Baseline 41/56 at eval, 50/56 07/26/20    Time 8    Period Weeks    Target Date 09/20/20      PT LONG TERM GOAL #3   Title Pt will report feeling at least 70% more confidence in his balance with stairs and transfers.    Baseline 15% improved confidence    Time 8    Period Weeks    Status On-going    Target Date 09/20/20      PT LONG TERM GOAL #4   Title Pt will achieve at least 4+/5 strength in bil LEs and trunk to improve postural support and stability with transfers and stairs    Baseline Lt knee 4+/5 with pain, Lt hip flexion/abd/ext 4/5, core 3+/5    Time 8    Period Weeks    Status On-going    Target Date 09/20/20                 Plan - 07/26/20 1211    Clinical Impression Statement Pt continues to have fluctuating levels of Lt knee pain, with some days having high pain levels like today which was 8/10.  Pt always actively participates willingly during sessions despite pain.  He continues to target weight loss and got pre-surgical clearance from his MD last week with hopes he will get approved for Lt knee surgery by surgeon when he meets with him next week.  Pt has 4+/5 strength surrounding Lt knee and 4/5 strength in Lt hip.  He demos ability to perform sit to stand without need for UE  assist.  He feels 15% improvement in balance confidence since starting PT.  His scores have improved on all balance measures from eval: TUG from 14 sec to 12 sec, 5x sit to stand from 17 sec to 12 sec, and BERG balance score from 41/56 to 50/56.  Pt was encouraged to see these measured successes.  He will continue to benefit from ongoing PT to further strengthen trunk and Lt LE and work on remaining areas of balance challenge.    Personal Factors and Comorbidities Age;Fitness;Comorbidity 1;Comorbidity 2;Comorbidity 3+    Comorbidities DM neuropathy, obesity, Lt knee OA  Examination-Activity Limitations Locomotion Level;Transfers;Stairs;Squat;Bend;Lift    Examination-Participation Restrictions Cleaning;Community Activity;Shop;Laundry;Meal Prep    Stability/Clinical Decision Making Stable/Uncomplicated    Clinical Decision Making Low    Rehab Potential Good    PT Frequency 2x / week    PT Duration 8 weeks    PT Treatment/Interventions ADLs/Self Care Home Management;Balance training;Therapeutic exercise;Functional mobility training;Stair training;Gait training;Therapeutic activities;Patient/family education;Neuromuscular re-education    PT Next Visit Plan continue hip and knee strength, balance in narrow BOS (tandem and feet together eyes closed), incorporate UE bands in standing for trunk strength and balance    PT Home Exercise Plan Access Code: [email or web address]    Consulted and Agree with Plan of Care Patient           Patient will benefit from skilled therapeutic intervention in order to improve the following deficits and impairments:  Abnormal gait,Decreased range of motion,Difficulty walking,Obesity,Decreased endurance,Pain,Decreased activity tolerance,Decreased balance,Improper body mechanics,Postural dysfunction,Decreased strength,Decreased mobility  Visit Diagnosis: Muscle weakness (generalized) - Plan: PT plan of care cert/re-cert  Repeated falls - Plan: PT plan of care  cert/re-cert     Problem List Patient Active Problem List   Diagnosis Date Noted  . Hypertension associated with type 2 diabetes mellitus ([place]) 05/23/2020  . Chronic kidney disease (CKD), stage III (moderate) ([place]) 12/13/2019  . Carpal tunnel syndrome 12/05/2019  . Elevated creatine kinase 11/15/2019  . Aortic atherosclerosis ([place]) 11/15/2019  . Recurrent Clostridium difficile diarrhea 05/28/2017  . Peripheral neuropathy 03/17/2017  . Diabetes ([place]) 12/11/2016  . Allergic rhinitis 12/11/2016  . Degenerative arthritis of left knee 01/30/2016  . Class 3 severe obesity with serious comorbidity and body mass index (BMI) of 45.0 to 49.9 in adult ([place]) 01/30/2016  . History of pulmonary embolus (PE) 12/19/2013  . Anxiety state 03/29/2007  . Major depressive disorder 03/29/2007  . OSA (obstructive sleep apnea) 03/29/2007  . Unspecified glaucoma 03/29/2007  . BPH (benign prostatic hyperplasia) 03/29/2007  . OA (osteoarthritis) of knee 03/29/2007  . Essential hypertension 03/29/2007  . History of Bell's palsy 03/29/2007    Alene Mires [place] 07/26/2020, 12:17 PM  [facility] [address] [place], [place], [address] Phone: [phone]   Fax:  [phone]  Name: Tanner [name] MRN: [id number] Date of Birth: 1946/05/09

## 2020-07-26 NOTE — Progress Notes (Signed)
Chronic Care Management Pharmacy Assistant   Name: Tanner [name]  MRN: [id number] DOB: [birth year]  Reason for Encounter: General Adherence Call   PCP : Tanner Koch, MD  Allergies:   Allergies  Allergen Reactions  . Codeine Sulfate Itching and Nausea Only    Medications: Outpatient Encounter Medications as of 07/26/2020  Medication Sig  . acetaminophen (TYLENOL) 500 MG tablet Take 1,000 mg by mouth as needed.  [place] Kitchen apixaban (ELIQUIS) 5 MG TABS tablet Take 1 tablet (5 mg total) by mouth 2 (two) times daily.  Mar[place] Kitchen atenolol (TENORMIN) 25 MG tablet Take 12.5 mg by mouth daily.  Mar[place] Kitchen atorvastatin (LIPITOR) 80 MG tablet Take 40 mg by mouth daily.  Mar[place] Kitchen buPROPion (WELLBUTRIN SR) 150 MG 12 hr tablet Take 150 mg by mouth 2 (two) times daily.  . finasteride (PROSCAR) 5 MG tablet Take 5 mg by mouth at bedtime.  . furosemide (LASIX) 20 MG tablet Take 2 tablets (40 mg total) by mouth 2 (two) times daily. (Patient taking differently: Take 40 mg by mouth daily.)  . gabapentin (NEURONTIN) 300 MG capsule Take 2 tablets (600 mg) three times daily AND 300 MG AT BEDTIME  . ketoconazole (NIZORAL) 2 % shampoo Apply topically daily.  . Menthol-Methyl Salicylate (THERA-GESIC PLUS) CREA Apply 1 application topically at bedtime. Apply to lower back  . Multiple Vitamins-Minerals (MULTIVITAMINS THER. W/MINERALS) TABS Take 1 tablet by mouth daily.  Mar[place] Kitchen neomycin-polymyxin-hydrocortisone (CORTISPORIN) 3.5-10000-1 OTIC suspension Place 2 drops into both ears daily.  . nortriptyline (PAMELOR) 10 MG capsule 2 CAPSULES EVERY NIGHT AT BEDTIME  . potassium chloride (KLOR-CON) 10 MEQ tablet Take 4 tablets (40 mEq total) by mouth 2 (two) times daily. (Patient taking differently: Take 20 mEq by mouth daily.)  . saccharomyces boulardii (FLORASTOR) 250 MG capsule Take 1 capsule (250 mg total) by mouth 2 (two) times daily.  . Semaglutide,0.25 or 0.5MG /DOS, (OZEMPIC, 0.25 OR 0.5 MG/DOSE,) 2 MG/1.5ML SOPN Inject 0.5 mg into the  skin once a week.   No facility-administered encounter medications on file as of 07/26/2020.    Current Diagnosis: Patient Active Problem List   Diagnosis Date Noted  . Hypertension associated with type 2 diabetes mellitus ([place]) 05/23/2020  . Chronic kidney disease (CKD), stage III (moderate) ([place]) 12/13/2019  . Carpal tunnel syndrome 12/05/2019  . Elevated creatine kinase 11/15/2019  . Aortic atherosclerosis ([place]) 11/15/2019  . Recurrent Clostridium difficile diarrhea 05/28/2017  . Peripheral neuropathy 03/17/2017  . Diabetes ([place]) 12/11/2016  . Allergic rhinitis 12/11/2016  . Degenerative arthritis of left knee 01/30/2016  . Class 3 severe obesity with serious comorbidity and body mass index (BMI) of 45.0 to 49.9 in adult ([place]) 01/30/2016  . History of pulmonary embolus (PE) 12/19/2013  . Anxiety state 03/29/2007  . Major depressive disorder 03/29/2007  . OSA (obstructive sleep apnea) 03/29/2007  . Unspecified glaucoma 03/29/2007  . BPH (benign prostatic hyperplasia) 03/29/2007  . OA (osteoarthritis) of knee 03/29/2007  . Essential hypertension 03/29/2007  . History of Bell's palsy 03/29/2007    Goals Addressed   None     Follow-Up:  Pharmacist Review    A general adherence call was made to Tanner [name] to check on how he has been doing since he last spoke with the clinical pharmacist Tanner [name]. The patient was asked about his blood pressure which he states have been pretty good lately. He states that he remembers his last blood pressure reading at home for him was 136/58 and that he has not had  any high or low readings. He states that he does not have any dizziness or light headedness but when he does he checks his blood pressure. Patient states that he continues to take Atenolol for his blood pressure.  The patient was also asked about his blood sugar and how often he checks it at home. The patient states that he does not take it as often as he needs to, only when he thinks he is  feeling some kind of way. The patient did not have any recent readings. The patient stated that he is having some swelling and numbness in his feet and hands and more worse in hands. He states that he is trying to lose weight to help with this and also trying to lose so that he can have surgery on his knee. He is going to [place]. I let the patient know that I will forward the information to the clinical pharmacist Tanner [name] for review.   Tanner [name], [place] [phone]

## 2020-07-29 ENCOUNTER — Other Ambulatory Visit: Payer: Self-pay

## 2020-07-29 ENCOUNTER — Encounter: Payer: Self-pay | Admitting: Physical Therapy

## 2020-07-29 ENCOUNTER — Ambulatory Visit: Payer: Medicare HMO | Admitting: Physical Therapy

## 2020-07-29 DIAGNOSIS — R296 Repeated falls: Secondary | ICD-10-CM | POA: Diagnosis not present

## 2020-07-29 DIAGNOSIS — M6281 Muscle weakness (generalized): Secondary | ICD-10-CM

## 2020-07-29 NOTE — Therapy (Signed)
[facility] [address] [place], [place], [address] Phone: [phone]   Fax:  [phone]  Physical Therapy Treatment  Patient Details  Name: Tanner [name] MRN: [id number] Date of Birth: Jun 17, 1945 Referring Provider (PT): Briscoe Deutscher, DO   Encounter Date: 07/29/2020   PT End of Session - 07/29/20 1226    Visit Number 8    Date for PT Re-Evaluation 07/30/20    Authorization Type Aetna Medicare    Progress Note Due on Visit 10    PT Start Time 1224    PT Stop Time 1302    PT Time Calculation (min) 38 min    Activity Tolerance Patient tolerated treatment well    Behavior During Therapy [facility] for tasks assessed/performed           Past Medical History:  Diagnosis Date  . Acute kidney failure ([place])   . Arthritis   . Back pain   . BPH (benign prostatic hypertrophy)   . Clostridium difficile infection   . Clotting disorder ([place])   . Depression   . Diabetes ([place]) 12/11/2016  . DVT (deep venous thrombosis) ([place])   . DVT of deep femoral vein, right ([place]) 06/29/2018  . Edema, lower extremity   . High cholesterol   . Hypertension   . Joint pain   . Low back pain potentially associated with spinal stenosis   . Neuromuscular disorder ([place])   . Neuropathy of lower extremity    bilateral  . OSA (obstructive sleep apnea)   . Osteoarthritis   . PE (pulmonary embolism)   . Ventral hernia     Past Surgical History:  Procedure Laterality Date  . EYE SURGERY    . HERNIA REPAIR  1999    There were no vitals filed for this visit.   Subjective Assessment - 07/29/20 1227    Subjective Mild pain day.    Pertinent History Lt knee OA with anticipated TKR in Jan    Limitations Walking    Currently in Pain? Yes    Pain Score 4     Pain Location Knee    Pain Orientation Left    Pain Descriptors / Indicators Sore    Multiple Pain Sites No                             [facility] Adult PT  Treatment/Exercise - 07/29/20 0001      Knee/Hip Exercises: Aerobic   Nustep L3 x 10' PT present to discuss progress and goals      Knee/Hip Exercises: Standing   Heel Raises Both;1 set;20 reps    Other Standing Knee Exercises on black pad: static balance 1 min, weight shifting 1 min, narrow BOS 1 min, marching    Other Standing Knee Exercises green band rows 15x2   5# KB upright rows in standing 2x10     Knee/Hip Exercises: Seated   Ball Squeeze 20x, added 4# LAQ 10X2    Clamshell with TheraBand Yellow   loop 25x   Sit to Sand 2 sets;5 reps;with UE support;without UE support   2nd set without UEs, improved ability to perform with LEs                   PT Short Term Goals - 07/26/20 1020      PT SHORT TERM GOAL #1   Title Pt will be ind with HEP    Status Achieved  PT SHORT TERM GOAL #2   Title Pt will demo sit to stand followed by forward reach for cane 5x without LOB    Status Achieved      PT SHORT TERM GOAL #3   Title 5x sit to stand without UE support in 15 sec or less             PT Long Term Goals - 07/26/20 1021      PT LONG TERM GOAL #1   Title Pt will be ind with advanced HEP and understand how to safely progress    Status On-going      PT LONG TERM GOAL #2   Title Pt will improve BERG score to at least 48/56 to demo reduced falls.    Baseline 41/56 at eval, 50/56 07/26/20    Time 8    Period Weeks    Target Date 09/20/20      PT LONG TERM GOAL #3   Title Pt will report feeling at least 70% more confidence in his balance with stairs and transfers.    Baseline 15% improved confidence    Time 8    Period Weeks    Status On-going    Target Date 09/20/20      PT LONG TERM GOAL #4   Title Pt will achieve at least 4+/5 strength in bil LEs and trunk to improve postural support and stability with transfers and stairs    Baseline Lt knee 4+/5 with pain, Lt hip flexion/abd/ext 4/5, core 3+/5    Time 8    Period Weeks    Status On-going     Target Date 09/20/20                 Plan - 07/29/20 1226    Clinical Impression Statement Pt scheduled to see ortho MD tomorrow. Pt's knee pain today described as mild. He was able to perform all exercises saying "I can feel my knee but not too bad." Pt performed balance activities ontop of the balance pad where he had to use  either light UE or just CGA for the static activities.    Personal Factors and Comorbidities Age;Fitness;Comorbidity 1;Comorbidity 2;Comorbidity 3+    Comorbidities DM neuropathy, obesity, Lt knee OA    Examination-Activity Limitations Locomotion Level;Transfers;Stairs;Squat;Bend;Lift    Examination-Participation Restrictions Cleaning;Community Activity;Shop;Laundry;Meal Prep    Stability/Clinical Decision Making Stable/Uncomplicated    Rehab Potential Good    PT Frequency 2x / week    PT Duration 8 weeks    PT Treatment/Interventions ADLs/Self Care Home Management;Balance training;Therapeutic exercise;Functional mobility training;Stair training;Gait training;Therapeutic activities;Patient/family education;Neuromuscular re-education    PT Next Visit Plan Pt sees ortho MD tomorrow, see what the plan is.    PT Home Exercise Plan Access Code: [email or web address]    [email or web address] and Agree with Plan of Care Patient           Patient will benefit from skilled therapeutic intervention in order to improve the following deficits and impairments:  Abnormal gait,Decreased range of motion,Difficulty walking,Obesity,Decreased endurance,Pain,Decreased activity tolerance,Decreased balance,Improper body mechanics,Postural dysfunction,Decreased strength,Decreased mobility  Visit Diagnosis: Muscle weakness (generalized)  Repeated falls     Problem List Patient Active Problem List   Diagnosis Date Noted  . Hypertension associated with type 2 diabetes mellitus ([place]) 05/23/2020  . Chronic kidney disease (CKD), stage III (moderate) ([place]) 12/13/2019  . Carpal tunnel syndrome  12/05/2019  . Elevated creatine kinase 11/15/2019  . Aortic atherosclerosis ([place]) 11/15/2019  . Recurrent Clostridium difficile diarrhea 05/28/2017  .  Peripheral neuropathy 03/17/2017  . Diabetes ([place]) 12/11/2016  . Allergic rhinitis 12/11/2016  . Degenerative arthritis of left knee 01/30/2016  . Class 3 severe obesity with serious comorbidity and body mass index (BMI) of 45.0 to 49.9 in adult ([place]) 01/30/2016  . History of pulmonary embolus (PE) 12/19/2013  . Anxiety state 03/29/2007  . Major depressive disorder 03/29/2007  . OSA (obstructive sleep apnea) 03/29/2007  . Unspecified glaucoma 03/29/2007  . BPH (benign prostatic hyperplasia) 03/29/2007  . OA (osteoarthritis) of knee 03/29/2007  . Essential hypertension 03/29/2007  . History of Bell's palsy 03/29/2007    COCHRAN,JENNIFER, PTA 07/29/2020, 1:05 PM  [facility] [address] [place], [place], [address] Phone: [phone]   Fax:  [phone]  Name: Tanner [name] MRN: [id number] Date of Birth: [birth year]

## 2020-07-30 ENCOUNTER — Other Ambulatory Visit: Payer: Self-pay | Admitting: Orthopedic Surgery

## 2020-07-30 DIAGNOSIS — Z01818 Encounter for other preprocedural examination: Secondary | ICD-10-CM

## 2020-07-30 DIAGNOSIS — M1712 Unilateral primary osteoarthritis, left knee: Secondary | ICD-10-CM | POA: Diagnosis not present

## 2020-07-31 ENCOUNTER — Telehealth: Payer: Self-pay | Admitting: Internal Medicine

## 2020-07-31 NOTE — Telephone Encounter (Signed)
Patient called and said that he is needing lab work done for his surgical clearance. His last ov was 2.8.22. Please advise

## 2020-07-31 NOTE — Telephone Encounter (Signed)
Per [name] labs have already been done and are in the patient's chart.

## 2020-08-02 ENCOUNTER — Ambulatory Visit: Payer: Medicare HMO | Admitting: Physical Therapy

## 2020-08-02 ENCOUNTER — Other Ambulatory Visit: Payer: Self-pay

## 2020-08-02 ENCOUNTER — Encounter: Payer: Self-pay | Admitting: Physical Therapy

## 2020-08-02 DIAGNOSIS — M6281 Muscle weakness (generalized): Secondary | ICD-10-CM

## 2020-08-02 DIAGNOSIS — R296 Repeated falls: Secondary | ICD-10-CM | POA: Diagnosis not present

## 2020-08-02 NOTE — Therapy (Signed)
[facility] [address], [place], [place], [address] Phone: [phone]   Fax:  [phone]  Physical Therapy Treatment  Patient Details  Name: Tanner [name] MRN: [id number] Date of Birth: 1945-10-23 Referring Provider (PT): Briscoe Deutscher, DO   Encounter Date: 08/02/2020   PT End of Session - 08/02/20 1012    Visit Number 9    Date for PT Re-Evaluation 07/30/20    Authorization Type [employer] Medicare    Progress Note Due on Visit 10    PT Start Time 1012    PT Stop Time 1050    PT Time Calculation (min) 38 min    Activity Tolerance Patient tolerated treatment well    Behavior During Therapy [facility] for tasks assessed/performed           Past Medical History:  Diagnosis Date  . Acute kidney failure ([place])   . Arthritis   . Back pain   . BPH (benign prostatic hypertrophy)   . Clostridium difficile infection   . Clotting disorder ([place])   . Depression   . Diabetes ([place]) 12/11/2016  . DVT (deep venous thrombosis) ([place])   . DVT of deep femoral vein, right ([place]) 06/29/2018  . Edema, lower extremity   . High cholesterol   . Hypertension   . Joint pain   . Low back pain potentially associated with spinal stenosis   . Neuromuscular disorder ([place])   . Neuropathy of lower extremity    bilateral  . OSA (obstructive sleep apnea)   . Osteoarthritis   . PE (pulmonary embolism)   . Ventral hernia     Past Surgical History:  Procedure Laterality Date  . EYE SURGERY    . HERNIA REPAIR  1999    There were no vitals filed for this visit.   Subjective Assessment - 08/02/20 1013    Subjective Pt saw MD, is scheduled for TKR March 14. Pre-op is on the 8th. Mild knee pain today, feels stiff.    Pertinent History Lt knee OA with anticipated TKR in Jan    Currently in Pain? Yes    Pain Score 4     Pain Location Knee    Pain Orientation Left    Pain Descriptors / Indicators Tightness;Sore    Aggravating Factors  Walking  too much on it    Pain Relieving Factors Sitting, Nustep makes it feel better    Multiple Pain Sites No                             [facility] Adult PT Treatment/Exercise - 08/02/20 0001      Knee/Hip Exercises: Aerobic   Nustep L4 x 12 min PTA present      Knee/Hip Exercises: Standing   Heel Raises Both;1 set;20 reps    Other Standing Knee Exercises SLS with oppsite toe taps on first step 10x each side. Definite use of UE for support    Other Standing Knee Exercises green band rows 20x, extensions 20x      Knee/Hip Exercises: Seated   Ball Squeeze 20x, added 4# LAQ 10X2    Clamshell with TheraBand --   20x red   Abd/Adduction Limitations Ankle rocker board warm up x4 min   edema in ankles, pt didn't take pills he reports                   PT Short Term Goals - 07/26/20 1020  PT SHORT TERM GOAL #1   Title Pt will be ind with HEP    Status Achieved      PT SHORT TERM GOAL #2   Title Pt will demo sit to stand followed by forward reach for cane 5x without LOB    Status Achieved      PT SHORT TERM GOAL #3   Title 5x sit to stand without UE support in 15 sec or less             PT Long Term Goals - 07/26/20 1021      PT LONG TERM GOAL #1   Title Pt will be ind with advanced HEP and understand how to safely progress    Status On-going      PT LONG TERM GOAL #2   Title Pt will improve BERG score to at least 48/56 to demo reduced falls.    Baseline 41/56 at eval, 50/56 07/26/20    Time 8    Period Weeks    Target Date 09/20/20      PT LONG TERM GOAL #3   Title Pt will report feeling at least 70% more confidence in his balance with stairs and transfers.    Baseline 15% improved confidence    Time 8    Period Weeks    Status On-going    Target Date 09/20/20      PT LONG TERM GOAL #4   Title Pt will achieve at least 4+/5 strength in bil LEs and trunk to improve postural support and stability with transfers and stairs    Baseline Lt knee  4+/5 with pain, Lt hip flexion/abd/ext 4/5, core 3+/5    Time 8    Period Weeks    Status On-going    Target Date 09/20/20                 Plan - 08/02/20 1012    Clinical Impression Statement Pt saw MD this week and is scheduled for TKR 08/26/20. His plan is 4# more weight loss before then. Pt reports no falls "in several weeks" but still does not feel super confident with his balance especially when moving from one surface to another surface or when there is height change ( step down ). PTA alternated exercises between standing and seated to help avoid increasing his knee pain too much.    Personal Factors and Comorbidities Age;Fitness;Comorbidity 1;Comorbidity 2;Comorbidity 3+    Comorbidities DM neuropathy, obesity, Lt knee OA    Examination-Activity Limitations Locomotion Level;Transfers;Stairs;Squat;Bend;Lift    Examination-Participation Restrictions Cleaning;Community Activity;Shop;Laundry;Meal Prep    Stability/Clinical Decision Making Stable/Uncomplicated    Rehab Potential Good    PT Frequency 2x / week    PT Duration 8 weeks    PT Treatment/Interventions ADLs/Self Care Home Management;Balance training;Therapeutic exercise;Functional mobility training;Stair training;Gait training;Therapeutic activities;Patient/family education;Neuromuscular re-education    PT Next Visit Plan Discuss with Pt DC plan next session. 10th visit PN, MMT for LTG    PT Home Exercise Plan Access Code: [email or web address]    Consulted and Agree with Plan of Care Patient           Patient will benefit from skilled therapeutic intervention in order to improve the following deficits and impairments:  Abnormal gait,Decreased range of motion,Difficulty walking,Obesity,Decreased endurance,Pain,Decreased activity tolerance,Decreased balance,Improper body mechanics,Postural dysfunction,Decreased strength,Decreased mobility  Visit Diagnosis: Muscle weakness (generalized)  Repeated falls     Problem  List Patient Active Problem List   Diagnosis Date Noted  . Hypertension associated with  type 2 diabetes mellitus ([place]) 05/23/2020  . Chronic kidney disease (CKD), stage III (moderate) ([place]) 12/13/2019  . Carpal tunnel syndrome 12/05/2019  . Elevated creatine kinase 11/15/2019  . Aortic atherosclerosis ([place]) 11/15/2019  . Recurrent Clostridium difficile diarrhea 05/28/2017  . Peripheral neuropathy 03/17/2017  . Diabetes ([place]) 12/11/2016  . Allergic rhinitis 12/11/2016  . Degenerative arthritis of left knee 01/30/2016  . Class 3 severe obesity with serious comorbidity and body mass index (BMI) of 45.0 to 49.9 in adult ([place]) 01/30/2016  . History of pulmonary embolus (PE) 12/19/2013  . Anxiety state 03/29/2007  . Major depressive disorder 03/29/2007  . OSA (obstructive sleep apnea) 03/29/2007  . Unspecified glaucoma 03/29/2007  . BPH (benign prostatic hyperplasia) 03/29/2007  . OA (osteoarthritis) of knee 03/29/2007  . Essential hypertension 03/29/2007  . History of Bell's palsy 03/29/2007    Jillane Po, PTA 08/02/2020, 10:52 AM  [facility] [address], [place], [place], [address] Phone: [phone]   Fax:  [phone]  Name: Tanner [name] MRN: [id number] Date of Birth: 1945/07/09

## 2020-08-06 ENCOUNTER — Encounter: Payer: Self-pay | Admitting: Physical Therapy

## 2020-08-06 ENCOUNTER — Other Ambulatory Visit: Payer: Self-pay

## 2020-08-06 ENCOUNTER — Ambulatory Visit: Payer: Medicare HMO | Admitting: Physical Therapy

## 2020-08-06 DIAGNOSIS — R296 Repeated falls: Secondary | ICD-10-CM | POA: Diagnosis not present

## 2020-08-06 DIAGNOSIS — M6281 Muscle weakness (generalized): Secondary | ICD-10-CM | POA: Diagnosis not present

## 2020-08-06 NOTE — Therapy (Signed)
[facility] [address] [place], [place], [address] Phone: [phone]   Fax:  [phone]  Physical Therapy Treatment  Patient Details  Name: Tanner [name] MRN: [id number] Date of Birth: [birth year] Referring Provider (PT): Briscoe Deutscher, DO  Progress Note Reporting Period 06/04/20 to 08/06/20  See note below for Objective Data and Assessment of Progress/Goals.       Encounter Date: 08/06/2020   PT End of Session - 08/06/20 1243    Visit Number 10    Date for PT Re-Evaluation 09/20/20    Authorization Type Aetna Medicare    Progress Note Due on Visit 20    PT Start Time 1230    PT Stop Time 1315    PT Time Calculation (min) 45 min    Activity Tolerance Patient tolerated treatment well    Behavior During Therapy [facility] for tasks assessed/performed           Past Medical History:  Diagnosis Date  . Acute kidney failure ([place])   . Arthritis   . Back pain   . BPH (benign prostatic hypertrophy)   . Clostridium difficile infection   . Clotting disorder ([place])   . Depression   . Diabetes ([place]) 12/11/2016  . DVT (deep venous thrombosis) ([place])   . DVT of deep femoral vein, right ([place]) 06/29/2018  . Edema, lower extremity   . High cholesterol   . Hypertension   . Joint pain   . Low back pain potentially associated with spinal stenosis   . Neuromuscular disorder ([place])   . Neuropathy of lower extremity    bilateral  . OSA (obstructive sleep apnea)   . Osteoarthritis   . PE (pulmonary embolism)   . Ventral hernia     Past Surgical History:  Procedure Laterality Date  . EYE SURGERY    . HERNIA REPAIR  1999    There were no vitals filed for this visit.   Subjective Assessment - 08/06/20 1237    Subjective Feeling good but not doing much.  I was able to take trash out to curb this morning.  Pain is 1-2/10 today in knee.    Pertinent History Lt knee OA with anticipated TKR in March    Limitations  Walking    How long can you walk comfortably? 5 min    Patient Stated Goals get stronger and better balance    Currently in Pain? Yes    Pain Score 2     Pain Location Knee    Pain Orientation Left    Pain Descriptors / Indicators Sore;Tightness    Pain Type Chronic pain    Aggravating Factors  walking    Pain Relieving Factors sitting, NuStep              [facility] PT Assessment - 08/06/20 0001      Assessment   Medical Diagnosis R26.89 (ICD-10-CM) - Imbalance    Referring Provider (PT) Briscoe Deutscher, DO    Onset Date/Surgical Date --   2 years   Next MD Visit next week    Prior Therapy no      Precautions   Precautions Fall      Strength   Overall Strength Comments weak core with diastatsis, Rt hip 5/5    Left Hip Flexion 4/5    Left Hip Extension 4/5    Left Hip ABduction 4/5    Right Knee Flexion 4+/5    Right Knee Extension 4+/5  Left Knee Flexion 4+/5   pain   Left Knee Extension 4+/5   pain     Ambulation/Gait   Ambulation/Gait Yes    Assistive device Straight cane    Gait Pattern Step-through pattern;Wide base of support    Stairs Yes    Stairs Assistance 6: Modified independent (Device/Increase time)    Stair Management Technique One rail Right;Step to pattern    Number of Stairs 4    Height of Stairs 6      Standardized Balance Assessment   Five times sit to stand comments  12 sec, using UEs on chair      Berg Balance Test   Sit to Stand Able to stand without using hands and stabilize independently    Standing Unsupported Able to stand safely 2 minutes    Sitting with Back Unsupported but Feet Supported on Floor or Stool Able to sit safely and securely 2 minutes    Stand to Sit Sits safely with minimal use of hands    Transfers Able to transfer safely, minor use of hands    Standing Unsupported with Eyes Closed Able to stand 10 seconds safely    Standing Unsupported with Feet Together Able to place feet together independently and stand 1 minute  safely    From Standing, Reach Forward with Outstretched Arm Can reach confidently >25 cm (10")    From Standing Position, Pick up Object from Floor Able to pick up shoe safely and easily    From Standing Position, Turn to Look Behind Over each Shoulder Looks behind one side only/other side shows less weight shift    Turn 360 Degrees Able to turn 360 degrees safely one side only in 4 seconds or less    Standing Unsupported, Alternately Place Feet on Step/Stool Able to stand independently and safely and complete 8 steps in 20 seconds    Standing Unsupported, One Foot in Front Able to plae foot ahead of the other independently and hold 30 seconds    Standing on One Leg Tries to lift leg/unable to hold 3 seconds but remains standing independently    Total Score 50    Berg comment: 50/56      Timed Up and Go Test   TUG Normal TUG    Normal TUG (seconds) 12    TUG Comments with Rt UE cane                         [facility] Adult PT Treatment/Exercise - 08/06/20 0001      Exercises   Exercises Lumbar      Lumbar Exercises: Standing   Other Standing Lumbar Exercises dead lift 5lb to low mat table then to knees 1x10 each      Knee/Hip Exercises: Standing   Heel Raises Both;1 set;20 reps    Heel Raises Limitations with toe raise      Knee/Hip Exercises: Seated   Long Arc Quad Strengthening;Both;2 sets;10 reps;Weights    Long Arc Quad Weight 4 lbs.    Clamshell with TheraBand Red   20   Marching Strengthening;Both;20 reps;Weights    Marching Limitations 4lb ankle weights    Sit to [employer] 2 sets;5 reps;without UE support               Balance Exercises - 08/06/20 0001      Balance Exercises: Standing   Stepping Strategy Anterior;Posterior;Lateral   multi-color disc step taps around the world, varied heights, footwork cueing so  not to twist with fixed feet              PT Short Term Goals - 07/26/20 1020      PT SHORT TERM GOAL #1   Title Pt will be ind with HEP     Status Achieved      PT SHORT TERM GOAL #2   Title Pt will demo sit to stand followed by forward reach for cane 5x without LOB    Status Achieved      PT SHORT TERM GOAL #3   Title 5x sit to stand without UE support in 15 sec or less             PT Long Term Goals - 07/26/20 1021      PT LONG TERM GOAL #1   Title Pt will be ind with advanced HEP and understand how to safely progress    Status On-going      PT LONG TERM GOAL #2   Title Pt will improve BERG score to at least 48/56 to demo reduced falls.    Baseline 41/56 at eval, 50/56 07/26/20    Time 8    Period Weeks    Target Date 09/20/20      PT LONG TERM GOAL #3   Title Pt will report feeling at least 70% more confidence in his balance with stairs and transfers.    Baseline 15% improved confidence    Time 8    Period Weeks    Status On-going    Target Date 09/20/20      PT LONG TERM GOAL #4   Title Pt will achieve at least 4+/5 strength in bil LEs and trunk to improve postural support and stability with transfers and stairs    Baseline Lt knee 4+/5 with pain, Lt hip flexion/abd/ext 4/5, core 3+/5    Time 8    Period Weeks    Status On-going    Target Date 09/20/20                 Plan - 08/06/20 1309    Clinical Impression Statement Pt continues to be limited by Lt knee pain for which he is scheduled for TKR in March. He performs short distance ambulation only. Pt had recently demo'd signif improvements in balance scale scores including Berg, TUG and 5x sit to stand. He reports 15% improvement in balance since starting PT.  He tends to fall upon standing when reaching for cane or item on side table, so PT introduced footwork for turn/reach drills today with close supervision and good tolerance.  Pt also did well with 5lb kettlebell deadlifts to knees today.  PT recommends continued PT up to surgery date in March to continue working on strength, balance and endurance.    Comorbidities DM neuropathy,  obesity, Lt knee OA    Rehab Potential Good    PT Frequency 2x / week    PT Duration 8 weeks    PT Treatment/Interventions ADLs/Self Care Home Management;Balance training;Therapeutic exercise;Functional mobility training;Stair training;Gait training;Therapeutic activities;Patient/family education;Neuromuscular re-education    PT Next Visit Plan continue balance stepping drills with footwork cueing and close supervision, dead lifts to knees 5lb or try 10lb, LE strength for upcoming surgery prep    PT Home Exercise Plan Access Code: [email or web address]    Consulted and Agree with Plan of Care Patient           Patient will benefit from skilled therapeutic intervention in order to improve the  following deficits and impairments:     Visit Diagnosis: Muscle weakness (generalized)  Repeated falls     Problem List Patient Active Problem List   Diagnosis Date Noted  . Hypertension associated with type 2 diabetes mellitus ([place]) 05/23/2020  . Chronic kidney disease (CKD), stage III (moderate) ([place]) 12/13/2019  . Carpal tunnel syndrome 12/05/2019  . Elevated creatine kinase 11/15/2019  . Aortic atherosclerosis ([place]) 11/15/2019  . Recurrent Clostridium difficile diarrhea 05/28/2017  . Peripheral neuropathy 03/17/2017  . Diabetes ([place]) 12/11/2016  . Allergic rhinitis 12/11/2016  . Degenerative arthritis of left knee 01/30/2016  . Class 3 severe obesity with serious comorbidity and body mass index (BMI) of 45.0 to 49.9 in adult ([place]) 01/30/2016  . History of pulmonary embolus (PE) 12/19/2013  . Anxiety state 03/29/2007  . Major depressive disorder 03/29/2007  . OSA (obstructive sleep apnea) 03/29/2007  . Unspecified glaucoma 03/29/2007  . BPH (benign prostatic hyperplasia) 03/29/2007  . OA (osteoarthritis) of knee 03/29/2007  . Essential hypertension 03/29/2007  . History of Bell's palsy 03/29/2007    Alene Mires Sharise Lippy 08/06/2020, 1:15 PM  [facility] [address] [place], [place], [address] Phone: [phone]   Fax:  [phone]  Name: Tanner [name] MRN: [id number] Date of Birth: [birth year]

## 2020-08-09 ENCOUNTER — Ambulatory Visit: Payer: Medicare HMO | Admitting: Physical Therapy

## 2020-08-09 ENCOUNTER — Other Ambulatory Visit: Payer: Self-pay

## 2020-08-09 ENCOUNTER — Encounter: Payer: Self-pay | Admitting: Physical Therapy

## 2020-08-09 DIAGNOSIS — M6281 Muscle weakness (generalized): Secondary | ICD-10-CM

## 2020-08-09 DIAGNOSIS — R296 Repeated falls: Secondary | ICD-10-CM

## 2020-08-09 NOTE — Therapy (Signed)
[facility] [address], [place], [place], [address] Phone: [phone]   Fax:  [phone]  Physical Therapy Treatment  Patient Details  Name: Tanner [name] MRN: [id number] Date of Birth: June 18, 1945 Referring Provider (PT): Briscoe Deutscher, DO   Encounter Date: 08/09/2020   PT End of Session - 08/09/20 1010    Visit Number 11    Date for PT Re-Evaluation 09/20/20    Authorization Type Aetna Medicare    Progress Note Due on Visit 20    PT Start Time 1010    PT Stop Time 1043    PT Time Calculation (min) 33 min    Activity Tolerance Patient tolerated treatment well    Behavior During Therapy [facility] for tasks assessed/performed           Past Medical History:  Diagnosis Date  . Acute kidney failure ([place])   . Arthritis   . Back pain   . BPH (benign prostatic hypertrophy)   . Clostridium difficile infection   . Clotting disorder ([place])   . Depression   . Diabetes ([place]) 12/11/2016  . DVT (deep venous thrombosis) ([place])   . DVT of deep femoral vein, right ([place]) 06/29/2018  . Edema, lower extremity   . High cholesterol   . Hypertension   . Joint pain   . Low back pain potentially associated with spinal stenosis   . Neuromuscular disorder ([place])   . Neuropathy of lower extremity    bilateral  . OSA (obstructive sleep apnea)   . Osteoarthritis   . PE (pulmonary embolism)   . Ventral hernia     Past Surgical History:  Procedure Laterality Date  . EYE SURGERY    . HERNIA REPAIR  1999    There were no vitals filed for this visit.   Subjective Assessment - 08/09/20 1016    Subjective Knee 3/10 this AM.    Pertinent History Lt knee OA with anticipated TKR in March    Currently in Pain? Yes    Pain Score 3     Pain Location Knee    Pain Orientation Left    Pain Descriptors / Indicators Dull;Sore    Multiple Pain Sites No                             [facility] Adult PT Treatment/Exercise -  08/09/20 0001      Knee/Hip Exercises: Aerobic   Nustep L4 x 12 min      Knee/Hip Exercises: Standing   Heel Raises Both;1 set;20 reps    Heel Raises Limitations with toe raise      Knee/Hip Exercises: Seated   Long Arc Quad Strengthening;Both;2 sets;10 reps;Weights    Long Arc Quad Weight 4 lbs.    Ball Squeeze 25X    Clamshell with TheraBand Red   20   Marching Strengthening;Both;20 reps;Weights    Marching Limitations 4lb ankle weights    Sit to [employer] 2 sets;5 reps;without UE support                    PT Short Term Goals - 07/26/20 1020      PT SHORT TERM GOAL #1   Title Pt will be ind with HEP    Status Achieved      PT SHORT TERM GOAL #2   Title Pt will demo sit to stand followed by forward reach for cane 5x without LOB  Status Achieved      PT SHORT TERM GOAL #3   Title 5x sit to stand without UE support in 15 sec or less             PT Long Term Goals - 07/26/20 1021      PT LONG TERM GOAL #1   Title Pt will be ind with advanced HEP and understand how to safely progress    Status On-going      PT LONG TERM GOAL #2   Title Pt will improve BERG score to at least 48/56 to demo reduced falls.    Baseline 41/56 at eval, 50/56 07/26/20    Time 8    Period Weeks    Target Date 09/20/20      PT LONG TERM GOAL #3   Title Pt will report feeling at least 70% more confidence in his balance with stairs and transfers.    Baseline 15% improved confidence    Time 8    Period Weeks    Status On-going    Target Date 09/20/20      PT LONG TERM GOAL #4   Title Pt will achieve at least 4+/5 strength in bil LEs and trunk to improve postural support and stability with transfers and stairs    Baseline Lt knee 4+/5 with pain, Lt hip flexion/abd/ext 4/5, core 3+/5    Time 8    Period Weeks    Status On-going    Target Date 09/20/20                 Plan - 08/09/20 1043    Clinical Impression Statement Pt able to complete his TE without much  exacerbation of knee pain, some, but not too much as we kept the exercises mostly seated.    Personal Factors and Comorbidities Age;Fitness;Comorbidity 1;Comorbidity 2;Comorbidity 3+    Comorbidities DM neuropathy, obesity, Lt knee OA    Examination-Activity Limitations Locomotion Level;Transfers;Stairs;Squat;Bend;Lift    Examination-Participation Restrictions Cleaning;Community Activity;Shop;Laundry;Meal Prep    Stability/Clinical Decision Making Stable/Uncomplicated    Rehab Potential Good    PT Frequency 2x / week    PT Duration 8 weeks    PT Treatment/Interventions ADLs/Self Care Home Management;Balance training;Therapeutic exercise;Functional mobility training;Stair training;Gait training;Therapeutic activities;Patient/family education;Neuromuscular re-education    PT Home Exercise Plan Access Code: [email or web address]    Consulted and Agree with Plan of Care Patient           Patient will benefit from skilled therapeutic intervention in order to improve the following deficits and impairments:  Abnormal gait,Decreased range of motion,Difficulty walking,Obesity,Decreased endurance,Pain,Decreased activity tolerance,Decreased balance,Improper body mechanics,Postural dysfunction,Decreased strength,Decreased mobility  Visit Diagnosis: Muscle weakness (generalized)  Repeated falls     Problem List Patient Active Problem List   Diagnosis Date Noted  . Hypertension associated with type 2 diabetes mellitus ([place]) 05/23/2020  . Chronic kidney disease (CKD), stage III (moderate) ([place]) 12/13/2019  . Carpal tunnel syndrome 12/05/2019  . Elevated creatine kinase 11/15/2019  . Aortic atherosclerosis ([place]) 11/15/2019  . Recurrent Clostridium difficile diarrhea 05/28/2017  . Peripheral neuropathy 03/17/2017  . Diabetes ([place]) 12/11/2016  . Allergic rhinitis 12/11/2016  . Degenerative arthritis of left knee 01/30/2016  . Class 3 severe obesity with serious comorbidity and body mass index (BMI) of 45.0  to 49.9 in adult ([place]) 01/30/2016  . History of pulmonary embolus (PE) 12/19/2013  . Anxiety state 03/29/2007  . Major depressive disorder 03/29/2007  . OSA (obstructive sleep apnea) 03/29/2007  . Unspecified glaucoma 03/29/2007  .  BPH (benign prostatic hyperplasia) 03/29/2007  . OA (osteoarthritis) of knee 03/29/2007  . Essential hypertension 03/29/2007  . History of Bell's palsy 03/29/2007    Romano Stigger, PTA 08/09/2020, 10:48 AM  [facility] [address] [place], [place], [address] Phone: [phone]   Fax:  [phone]  Name: TARIN NAVAREZ MRN: [id number] Date of Birth: 03-20-1946

## 2020-08-12 ENCOUNTER — Other Ambulatory Visit: Payer: Self-pay

## 2020-08-12 ENCOUNTER — Ambulatory Visit: Payer: Medicare HMO | Admitting: Physical Therapy

## 2020-08-12 ENCOUNTER — Encounter (INDEPENDENT_AMBULATORY_CARE_PROVIDER_SITE_OTHER): Payer: Self-pay | Admitting: Family Medicine

## 2020-08-12 ENCOUNTER — Encounter: Payer: Self-pay | Admitting: Physical Therapy

## 2020-08-12 DIAGNOSIS — M6281 Muscle weakness (generalized): Secondary | ICD-10-CM | POA: Diagnosis not present

## 2020-08-12 DIAGNOSIS — R296 Repeated falls: Secondary | ICD-10-CM | POA: Diagnosis not present

## 2020-08-12 NOTE — Therapy (Signed)
[facility] [address] [place], [place], [address] Phone: [phone]   Fax:  [phone]  Physical Therapy Treatment  Patient Details  Name: Tanner [name] MRN: [id number] Date of Birth: 1946-04-18 Referring Provider (PT): Briscoe Deutscher, DO   Encounter Date: 08/12/2020   PT End of Session - 08/12/20 1013    Visit Number 12    Date for PT Re-Evaluation 09/20/20    Authorization Type [employer] Medicare    Progress Note Due on Visit 20    PT Start Time 1012    PT Stop Time 1050    PT Time Calculation (min) 38 min    Activity Tolerance Patient tolerated treatment well    Behavior During Therapy [facility] for tasks assessed/performed           Past Medical History:  Diagnosis Date  . Acute kidney failure ([place])   . Arthritis   . Back pain   . BPH (benign prostatic hypertrophy)   . Clostridium difficile infection   . Clotting disorder ([place])   . Depression   . Diabetes ([place]) 12/11/2016  . DVT (deep venous thrombosis) ([place])   . DVT of deep femoral vein, right ([place]) 06/29/2018  . Edema, lower extremity   . High cholesterol   . Hypertension   . Joint pain   . Low back pain potentially associated with spinal stenosis   . Neuromuscular disorder ([place])   . Neuropathy of lower extremity    bilateral  . OSA (obstructive sleep apnea)   . Osteoarthritis   . PE (pulmonary embolism)   . Ventral hernia     Past Surgical History:  Procedure Laterality Date  . EYE SURGERY    . HERNIA REPAIR  1999    There were no vitals filed for this visit.   Subjective Assessment - 08/12/20 1015    Subjective My knee is a little sore sore today. Pt ambulating a touch slower, more cautious he reports because of knee. Knee swollen. pt has TKR pre-op/weigh in visit today with Dr Mayer Camel.    Currently in Pain? Yes    Pain Score 5     Pain Location Knee    Pain Orientation Left    Pain Descriptors / Indicators Sore    Aggravating Factors   walking, weightbearing    Pain Relieving Factors Sitting    Multiple Pain Sites No                             [facility] Adult PT Treatment/Exercise - 08/12/20 0001      Lumbar Exercises: Standing   Other Standing Lumbar Exercises dead lift 10lb from stool to chest 10x2    Other Standing Lumbar Exercises Red band Pallof press 15x each front press, then lateral press      Knee/Hip Exercises: Aerobic   Nustep L4 x 12 min PTA present to discuss current status.      Knee/Hip Exercises: Standing   Heel Raises Both;1 set;20 reps    Other Standing Knee Exercises alt toe traps to first step requires UE assist 20x      Knee/Hip Exercises: Seated   Long Arc Quad Strengthening;Both;2 sets;Weights;15 reps    Long Arc Quad Weight 4 lbs.    Ball Squeeze 30x    Clamshell with TheraBand Red   20   Marching Strengthening;Both;20 reps;Weights;2 sets    Marching Limitations 4lb ankle weights  PT Short Term Goals - 07/26/20 1020      PT SHORT TERM GOAL #1   Title Pt will be ind with HEP    Status Achieved      PT SHORT TERM GOAL #2   Title Pt will demo sit to stand followed by forward reach for cane 5x without LOB    Status Achieved      PT SHORT TERM GOAL #3   Title 5x sit to stand without UE support in 15 sec or less             PT Long Term Goals - 07/26/20 1021      PT LONG TERM GOAL #1   Title Pt will be ind with advanced HEP and understand how to safely progress    Status On-going      PT LONG TERM GOAL #2   Title Pt will improve BERG score to at least 48/56 to demo reduced falls.    Baseline 41/56 at eval, 50/56 07/26/20    Time 8    Period Weeks    Target Date 09/20/20      PT LONG TERM GOAL #3   Title Pt will report feeling at least 70% more confidence in his balance with stairs and transfers.    Baseline 15% improved confidence    Time 8    Period Weeks    Status On-going    Target Date 09/20/20      PT LONG TERM GOAL  #4   Title Pt will achieve at least 4+/5 strength in bil LEs and trunk to improve postural support and stability with transfers and stairs    Baseline Lt knee 4+/5 with pain, Lt hip flexion/abd/ext 4/5, core 3+/5    Time 8    Period Weeks    Status On-going    Target Date 09/20/20                 Plan - 08/12/20 1014    Clinical Impression Statement Pt arrives with more knee soreness than previously. Today he has his pre-op visit with Dr Mayer Camel for TKR. Again pt can complete his knee strengthening exercises and standing trunk strength exericses but knee did become increasingly sore throughout the session. Knee has edema. Pt's knee pain effected his walking today, much more cautious/slow.    Personal Factors and Comorbidities Age;Fitness;Comorbidity 1;Comorbidity 2;Comorbidity 3+    Comorbidities DM neuropathy, obesity, Lt knee OA    Examination-Activity Limitations Locomotion Level;Transfers;Stairs;Squat;Bend;Lift    Examination-Participation Restrictions Cleaning;Community Activity;Shop;Laundry;Meal Prep    Stability/Clinical Decision Making Stable/Uncomplicated    Rehab Potential Good    PT Frequency 2x / week    PT Duration 8 weeks    PT Treatment/Interventions ADLs/Self Care Home Management;Balance training;Therapeutic exercise;Functional mobility training;Stair training;Gait training;Therapeutic activities;Patient/family education;Neuromuscular re-education    PT Next Visit Plan continue balance stepping drills with footwork cueing and close supervision, dead lifts to knees 5lb or try 10lb, LE strength for upcoming surgery prep    PT Home Exercise Plan Access Code: [email or web address]    Consulted and Agree with Plan of Care Patient           Patient will benefit from skilled therapeutic intervention in order to improve the following deficits and impairments:  Abnormal gait,Decreased range of motion,Difficulty walking,Obesity,Decreased endurance,Pain,Decreased activity  tolerance,Decreased balance,Improper body mechanics,Postural dysfunction,Decreased strength,Decreased mobility  Visit Diagnosis: Muscle weakness (generalized)  Repeated falls     Problem List Patient Active Problem List   Diagnosis Date Noted  .  Hypertension associated with type 2 diabetes mellitus ([place]) 05/23/2020  . Chronic kidney disease (CKD), stage III (moderate) ([place]) 12/13/2019  . Carpal tunnel syndrome 12/05/2019  . Elevated creatine kinase 11/15/2019  . Aortic atherosclerosis ([place]) 11/15/2019  . Recurrent Clostridium difficile diarrhea 05/28/2017  . Peripheral neuropathy 03/17/2017  . Diabetes ([place]) 12/11/2016  . Allergic rhinitis 12/11/2016  . Degenerative arthritis of left knee 01/30/2016  . Class 3 severe obesity with serious comorbidity and body mass index (BMI) of 45.0 to 49.9 in adult ([place]) 01/30/2016  . History of pulmonary embolus (PE) 12/19/2013  . Anxiety state 03/29/2007  . Major depressive disorder 03/29/2007  . OSA (obstructive sleep apnea) 03/29/2007  . Unspecified glaucoma 03/29/2007  . BPH (benign prostatic hyperplasia) 03/29/2007  . OA (osteoarthritis) of knee 03/29/2007  . Essential hypertension 03/29/2007  . History of Bell's palsy 03/29/2007    Garmon Dehn,PTA 08/12/2020, 10:53 AM   [facility] [address], [place], [place], [address] Phone: [phone]   Fax:  [phone]  Name: Tanner [name] MRN: [id number] Date of Birth: [birth year]

## 2020-08-14 ENCOUNTER — Encounter: Payer: Self-pay | Admitting: Physical Therapy

## 2020-08-14 ENCOUNTER — Ambulatory Visit: Payer: Medicare HMO | Attending: Family Medicine | Admitting: Physical Therapy

## 2020-08-14 ENCOUNTER — Other Ambulatory Visit: Payer: Self-pay

## 2020-08-14 DIAGNOSIS — M25562 Pain in left knee: Secondary | ICD-10-CM | POA: Diagnosis not present

## 2020-08-14 DIAGNOSIS — R296 Repeated falls: Secondary | ICD-10-CM | POA: Diagnosis not present

## 2020-08-14 DIAGNOSIS — R600 Localized edema: Secondary | ICD-10-CM | POA: Insufficient documentation

## 2020-08-14 DIAGNOSIS — M6281 Muscle weakness (generalized): Secondary | ICD-10-CM | POA: Insufficient documentation

## 2020-08-14 DIAGNOSIS — G8929 Other chronic pain: Secondary | ICD-10-CM | POA: Diagnosis not present

## 2020-08-14 NOTE — Therapy (Signed)
[facility] [address], [place], [place], [address] Phone: [phone]   Fax:  [phone]  Physical Therapy Treatment  Patient Details  Name: Tanner [name] MRN: [id number] Date of Birth: 02/12/1946 Referring Provider (PT): Briscoe Deutscher, DO   Encounter Date: 08/14/2020   PT End of Session - 08/14/20 1015    Visit Number 13    Date for PT Re-Evaluation 09/20/20    Authorization Type Aetna Medicare    Progress Note Due on Visit 20    PT Start Time 1015    PT Stop Time 1055    PT Time Calculation (min) 40 min    Activity Tolerance Patient tolerated treatment well    Behavior During Therapy [facility] for tasks assessed/performed           Past Medical History:  Diagnosis Date  . Acute kidney failure ([place])   . Arthritis   . Back pain   . BPH (benign prostatic hypertrophy)   . Clostridium difficile infection   . Clotting disorder ([place])   . Depression   . Diabetes ([place]) 12/11/2016  . DVT (deep venous thrombosis) ([place])   . DVT of deep femoral vein, right ([place]) 06/29/2018  . Edema, lower extremity   . High cholesterol   . Hypertension   . Joint pain   . Low back pain potentially associated with spinal stenosis   . Neuromuscular disorder ([place])   . Neuropathy of lower extremity    bilateral  . OSA (obstructive sleep apnea)   . Osteoarthritis   . PE (pulmonary embolism)   . Ventral hernia     Past Surgical History:  Procedure Laterality Date  . EYE SURGERY    . HERNIA REPAIR  1999    There were no vitals filed for this visit.   Subjective Assessment - 08/14/20 1019    Subjective 5# down! Surgery date 3/14.    Currently in Pain? Yes    Pain Score 3     Pain Location Knee    Pain Orientation Left    Pain Descriptors / Indicators Dull;Aching;Sore    Multiple Pain Sites No                             [facility] Adult PT Treatment/Exercise - 08/14/20 0001      Lumbar Exercises: Aerobic    Other Aerobic Exercise Ankle rocker board 3 min for initial assessment      Lumbar Exercises: Standing   Other Standing Lumbar Exercises dead lift 10lb from stool to chest 10x2    Other Standing Lumbar Exercises Blue band Pallof press 15x each front press, then lateral press      Knee/Hip Exercises: Aerobic   Nustep L4 x 14 min PTA present to discuss current status.      Knee/Hip Exercises: Seated   Long Arc Quad Strengthening;Both;2 sets;Weights;15 reps    Long Arc Quad Weight 4 lbs.    Ball Squeeze 30x    Clamshell with TheraBand Red   20   Marching Strengthening;Both;20 reps;Weights;2 sets    Marching Limitations 4lb ankle weights   Done in standing today                   PT Short Term Goals - 07/26/20 1020      PT SHORT TERM GOAL #1   Title Pt will be ind with HEP    Status Achieved  PT SHORT TERM GOAL #2   Title Pt will demo sit to stand followed by forward reach for cane 5x without LOB    Status Achieved      PT SHORT TERM GOAL #3   Title 5x sit to stand without UE support in 15 sec or less             PT Long Term Goals - 07/26/20 1021      PT LONG TERM GOAL #1   Title Pt will be ind with advanced HEP and understand how to safely progress    Status On-going      PT LONG TERM GOAL #2   Title Pt will improve BERG score to at least 48/56 to demo reduced falls.    Baseline 41/56 at eval, 50/56 07/26/20    Time 8    Period Weeks    Target Date 09/20/20      PT LONG TERM GOAL #3   Title Pt will report feeling at least 70% more confidence in his balance with stairs and transfers.    Baseline 15% improved confidence    Time 8    Period Weeks    Status On-going    Target Date 09/20/20      PT LONG TERM GOAL #4   Title Pt will achieve at least 4+/5 strength in bil LEs and trunk to improve postural support and stability with transfers and stairs    Baseline Lt knee 4+/5 with pain, Lt hip flexion/abd/ext 4/5, core 3+/5    Time 8    Period Weeks     Status On-going    Target Date 09/20/20                 Plan - 08/14/20 1044    Clinical Impression Statement Pt arrives with mlld knee pain today, "better than Monday." Pt reports he is down another 5# per his visit with the MD on Monday, surgery is still scheduled for 3/14.    Personal Factors and Comorbidities Age;Fitness;Comorbidity 1;Comorbidity 2;Comorbidity 3+    Comorbidities DM neuropathy, obesity, Lt knee OA    Examination-Activity Limitations Locomotion Level;Transfers;Stairs;Squat;Bend;Lift    Examination-Participation Restrictions Cleaning;Community Activity;Shop;Laundry;Meal Prep    Stability/Clinical Decision Making Stable/Uncomplicated    Rehab Potential Good    PT Frequency 2x / week    PT Duration 8 weeks    PT Treatment/Interventions ADLs/Self Care Home Management;Balance training;Therapeutic exercise;Functional mobility training;Stair training;Gait training;Therapeutic activities;Patient/family education;Neuromuscular re-education    PT Next Visit Plan Balance & trunk strength, continue with surgical prep    PT Home Exercise Plan Access Code: [email or web address]    Consulted and Agree with Plan of Care Patient           Patient will benefit from skilled therapeutic intervention in order to improve the following deficits and impairments:  Abnormal gait,Decreased range of motion,Difficulty walking,Obesity,Decreased endurance,Pain,Decreased activity tolerance,Decreased balance,Improper body mechanics,Postural dysfunction,Decreased strength,Decreased mobility  Visit Diagnosis: Muscle weakness (generalized)  Repeated falls     Problem List Patient Active Problem List   Diagnosis Date Noted  . Hypertension associated with type 2 diabetes mellitus ([place]) 05/23/2020  . Chronic kidney disease (CKD), stage III (moderate) ([place]) 12/13/2019  . Carpal tunnel syndrome 12/05/2019  . Elevated creatine kinase 11/15/2019  . Aortic atherosclerosis ([place]) 11/15/2019  . Recurrent  Clostridium difficile diarrhea 05/28/2017  . Peripheral neuropathy 03/17/2017  . Diabetes ([place]) 12/11/2016  . Allergic rhinitis 12/11/2016  . Degenerative arthritis of left knee 01/30/2016  . Class 3 severe  obesity with serious comorbidity and body mass index (BMI) of 45.0 to 49.9 in adult ([place]) 01/30/2016  . History of pulmonary embolus (PE) 12/19/2013  . Anxiety state 03/29/2007  . Major depressive disorder 03/29/2007  . OSA (obstructive sleep apnea) 03/29/2007  . Unspecified glaucoma 03/29/2007  . BPH (benign prostatic hyperplasia) 03/29/2007  . OA (osteoarthritis) of knee 03/29/2007  . Essential hypertension 03/29/2007  . History of Bell's palsy 03/29/2007    Laurin Paulo, PTA 08/14/2020, 10:48 AM  [facility] [address] [place], [place], [address] Phone: [phone]   Fax:  [phone]  Name: TYLIQUE AULL MRN: [id number] Date of Birth: 12/15/1945

## 2020-08-15 NOTE — Progress Notes (Signed)
DUE TO COVID-19 ONLY ONE VISITOR IS ALLOWED TO COME WITH YOU AND STAY IN THE WAITING ROOM ONLY DURING PRE OP AND PROCEDURE DAY OF SURGERY. THE 1 VISITOR  MAY VISIT WITH YOU AFTER SURGERY IN YOUR PRIVATE ROOM DURING VISITING HOURS ONLY!  YOU NEED TO HAVE A COVID 19 TEST ON__3/03/2021 _____ @_______ , THIS TEST MUST BE DONE BEFORE SURGERY,  COVID TESTING SITE [address] [place] [address], IT IS ON THE RIGHT GOING OUT [address] APPROXIMATELY  2 MINUTES PAST [employer] ON THE RIGHT. ONCE YOUR COVID TEST IS COMPLETED,  PLEASE BEGIN THE QUARANTINE INSTRUCTIONS AS OUTLINED IN YOUR HANDOUT.                Tanner [name]  08/15/2020   Your procedure is scheduled on: 08/26/2020    Report to [facility] Main  Entrance   Report to admitting at    [place] AM     Call this number if you have problems the morning of surgery [phone]    REMEMBER: NO  SOLID FOOD CANDY OR GUM AFTER MIDNIGHT. CLEAR LIQUIDS UNTIL   0415am       . NOTHING BY MOUTH EXCEPT CLEAR LIQUIDS UNTIL    . PLEASE FINISH ENSURE DRINK PER SURGEON ORDER  WHICH NEEDS TO BE COMPLETED AT  0415am     .      CLEAR LIQUID DIET   Foods Allowed                                                                    Coffee and tea, regular and decaf                            Fruit ices (not with fruit pulp)                                      Iced Popsicles                                    Carbonated beverages, regular and diet                                    Cranberry, grape and apple juices Sports drinks like Gatorade Lightly seasoned clear broth or consume(fat free) Sugar, honey syrup ___________________________________________________________________      BRUSH YOUR TEETH MORNING OF SURGERY AND RINSE YOUR MOUTH OUT, NO CHEWING GUM CANDY OR MINTS.     Take these medicines the morning of surgery with A SIP OF WATER: Atenolol, Wellbutrin, Gabapentin   DO NOT TAKE ANY DIABETIC MEDICATIONS DAY OF  YOUR SURGERY                               You may not have any metal on your body including hair pins and              piercings  Do not wear jewelry, make-up, lotions, powders or perfumes, deodorant             Do not wear nail polish on your fingernails.  Do not shave  48 hours prior to surgery.              Men may shave face and neck.   Do not bring valuables to the hospital. [place].  Contacts, dentures or bridgework may not be worn into surgery.  Leave suitcase in the car. After surgery it may be brought to your room.     Patients discharged the day of surgery will not be allowed to drive home. IF YOU ARE HAVING SURGERY AND GOING HOME THE SAME DAY, YOU MUST HAVE AN ADULT TO DRIVE YOU HOME AND BE WITH YOU FOR 24 HOURS. YOU MAY GO HOME BY TAXI OR UBER OR ORTHERWISE, BUT AN ADULT MUST ACCOMPANY YOU HOME AND STAY WITH YOU FOR 24 HOURS.  Name and phone number of your driver:  Special Instructions: N/A              Please read over the following fact sheets you were given: _____________________________________________________________________  [facility] - Preparing for Surgery Before surgery, you can play an important role.  Because skin is not sterile, your skin needs to be as free of germs as possible.  You can reduce the number of germs on your skin by washing with CHG (chlorahexidine gluconate) soap before surgery.  CHG is an antiseptic cleaner which kills germs and bonds with the skin to continue killing germs even after washing. Please DO NOT use if you have an allergy to CHG or antibacterial soaps.  If your skin becomes reddened/irritated stop using the CHG and inform your nurse when you arrive at Short Stay. Do not shave (including legs and underarms) for at least 48 hours prior to the first CHG shower.  You may shave your face/neck. Please follow these instructions carefully:  1.  Shower with CHG Soap the night before surgery and  the  morning of Surgery.  2.  If you choose to wash your hair, wash your hair first as usual with your  normal  shampoo.  3.  After you shampoo, rinse your hair and body thoroughly to remove the  shampoo.                           4.  Use CHG as you would any other liquid soap.  You can apply chg directly  to the skin and wash                       Gently with a scrungie or clean washcloth.  5.  Apply the CHG Soap to your body ONLY FROM THE NECK DOWN.   Do not use on face/ open                           Wound or open sores. Avoid contact with eyes, ears mouth and genitals (private parts).                       Wash face,  Genitals (private parts) with your normal soap.             6.  Wash  thoroughly, paying special attention to the area where your surgery  will be performed.  7.  Thoroughly rinse your body with warm water from the neck down.  8.  DO NOT shower/wash with your normal soap after using and rinsing off  the CHG Soap.                9.  Pat yourself dry with a clean towel.            10.  Wear clean pajamas.            11.  Place clean sheets on your bed the night of your first shower and do not  sleep with pets. Day of Surgery : Do not apply any lotions/deodorants the morning of surgery.  Please wear clean clothes to the hospital/surgery center.  FAILURE TO FOLLOW THESE INSTRUCTIONS MAY RESULT IN THE CANCELLATION OF YOUR SURGERY PATIENT SIGNATURE_________________________________  NURSE SIGNATURE__________________________________  ________________________________________________________________________

## 2020-08-19 ENCOUNTER — Encounter: Payer: Self-pay | Admitting: Physical Therapy

## 2020-08-19 ENCOUNTER — Ambulatory Visit: Payer: Medicare HMO | Admitting: Physical Therapy

## 2020-08-19 ENCOUNTER — Other Ambulatory Visit: Payer: Self-pay

## 2020-08-19 DIAGNOSIS — R600 Localized edema: Secondary | ICD-10-CM | POA: Diagnosis not present

## 2020-08-19 DIAGNOSIS — M25562 Pain in left knee: Secondary | ICD-10-CM | POA: Diagnosis not present

## 2020-08-19 DIAGNOSIS — R296 Repeated falls: Secondary | ICD-10-CM | POA: Diagnosis not present

## 2020-08-19 DIAGNOSIS — G8929 Other chronic pain: Secondary | ICD-10-CM | POA: Diagnosis not present

## 2020-08-19 DIAGNOSIS — M6281 Muscle weakness (generalized): Secondary | ICD-10-CM | POA: Diagnosis not present

## 2020-08-19 NOTE — Therapy (Signed)
[facility] [address], [place], [place], [address] Phone: [phone]   Fax:  [phone]  Physical Therapy Treatment  Patient Details  Name: Tanner [name] MRN: [id number] Date of Birth: August 24, 1945 Referring Provider (PT): Briscoe Deutscher, DO   Encounter Date: 08/19/2020   PT End of Session - 08/19/20 1057    Visit Number 14    Date for PT Re-Evaluation 09/20/20    Authorization Type [employer]    Progress Note Due on Visit 20    PT Start Time 1015    PT Stop Time 1058    PT Time Calculation (min) 43 min    Activity Tolerance Patient tolerated treatment well    Behavior During Therapy [facility] for tasks assessed/performed           Past Medical History:  Diagnosis Date  . Acute kidney failure ([place])   . Arthritis   . Back pain   . BPH (benign prostatic hypertrophy)   . Clostridium difficile infection   . Clotting disorder ([place])   . Depression   . Diabetes ([place]) 12/11/2016  . DVT (deep venous thrombosis) ([place])   . DVT of deep femoral vein, right ([place]) 06/29/2018  . Edema, lower extremity   . High cholesterol   . Hypertension   . Joint pain   . Low back pain potentially associated with spinal stenosis   . Neuromuscular disorder ([place])   . Neuropathy of lower extremity    bilateral  . OSA (obstructive sleep apnea)   . Osteoarthritis   . PE (pulmonary embolism)   . Ventral hernia     Past Surgical History:  Procedure Laterality Date  . EYE SURGERY    . HERNIA REPAIR  1999    There were no vitals filed for this visit.   Subjective Assessment - 08/19/20 1024    Subjective My knee is not good today.    Pertinent History Lt knee OA with anticipated TKR in March    Currently in Pain? Yes    Pain Score 7     Pain Location Knee    Pain Orientation Left    Pain Descriptors / Indicators Throbbing;Sore;Tightness    Aggravating Factors  Weightbearing    Pain Relieving Factors Sitting    Multiple Pain  Sites No                             [facility] Adult PT Treatment/Exercise - 08/19/20 0001      Lumbar Exercises: Standing   Other Standing Lumbar Exercises dead lift 10lb from stool to chest 10x2    Other Standing Lumbar Exercises Blue band Pallof press 15x each front press, then lateral press      Knee/Hip Exercises: Aerobic   Nustep L4 x 14 min PTA present to discuss current status.      Knee/Hip Exercises: Seated   Long Arc Quad Strengthening;Both;2 sets;Weights;15 reps    Long Arc Quad Weight 4 lbs.    Ball Squeeze 30x    Clamshell with TheraBand Red   20x2   Marching Strengthening;Both;20 reps;Weights;2 sets    Marching Limitations 4lb ankle weights   Done in standing today                   PT Short Term Goals - 07/26/20 1020      PT SHORT TERM GOAL #1   Title Pt will be ind with HEP  Status Achieved      PT SHORT TERM GOAL #2   Title Pt will demo sit to stand followed by forward reach for cane 5x without LOB    Status Achieved      PT SHORT TERM GOAL #3   Title 5x sit to stand without UE support in 15 sec or less             PT Long Term Goals - 07/26/20 1021      PT LONG TERM GOAL #1   Title Pt will be ind with advanced HEP and understand how to safely progress    Status On-going      PT LONG TERM GOAL #2   Title Pt will improve BERG score to at least 48/56 to demo reduced falls.    Baseline 41/56 at eval, 50/56 07/26/20    Time 8    Period Weeks    Target Date 09/20/20      PT LONG TERM GOAL #3   Title Pt will report feeling at least 70% more confidence in his balance with stairs and transfers.    Baseline 15% improved confidence    Time 8    Period Weeks    Status On-going    Target Date 09/20/20      PT LONG TERM GOAL #4   Title Pt will achieve at least 4+/5 strength in bil LEs and trunk to improve postural support and stability with transfers and stairs    Baseline Lt knee 4+/5 with pain, Lt hip flexion/abd/ext 4/5,  core 3+/5    Time 8    Period Weeks    Status On-going    Target Date 09/20/20                 Plan - 08/19/20 1027    Clinical Impression Statement Pt arrives with increased LT knee pain today, definitely moving slower due to this pain. Pt continues to tolerate all TE without increasing pain, just a reported "fatigue" in his legs after the standing exercises.    Personal Factors and Comorbidities Age;Fitness;Comorbidity 1;Comorbidity 2;Comorbidity 3+    Comorbidities DM neuropathy, obesity, Lt knee OA    Examination-Activity Limitations Locomotion Level;Transfers;Stairs;Squat;Bend;Lift    Examination-Participation Restrictions Cleaning;Community Activity;Shop;Laundry;Meal Prep    Stability/Clinical Decision Making Stable/Uncomplicated    Rehab Potential Good    PT Frequency 2x / week    PT Duration 8 weeks    PT Treatment/Interventions ADLs/Self Care Home Management;Balance training;Therapeutic exercise;Functional mobility training;Stair training;Gait training;Therapeutic activities;Patient/family education;Neuromuscular re-education    PT Next Visit Plan Last visit on Wed, TKR on Monday. MMT, etc...DC visit    PT Home Exercise Plan Access Code: [email or web address]    Consulted and Agree with Plan of Care Patient           Patient will benefit from skilled therapeutic intervention in order to improve the following deficits and impairments:  Abnormal gait,Decreased range of motion,Difficulty walking,Obesity,Decreased endurance,Pain,Decreased activity tolerance,Decreased balance,Improper body mechanics,Postural dysfunction,Decreased strength,Decreased mobility  Visit Diagnosis: Repeated falls  Muscle weakness (generalized)     Problem List Patient Active Problem List   Diagnosis Date Noted  . Hypertension associated with type 2 diabetes mellitus ([place]) 05/23/2020  . Chronic kidney disease (CKD), stage III (moderate) ([place]) 12/13/2019  . Carpal tunnel syndrome 12/05/2019  .  Elevated creatine kinase 11/15/2019  . Aortic atherosclerosis ([place]) 11/15/2019  . Recurrent Clostridium difficile diarrhea 05/28/2017  . Peripheral neuropathy 03/17/2017  . Diabetes ([place]) 12/11/2016  . Allergic rhinitis 12/11/2016  .  Degenerative arthritis of left knee 01/30/2016  . Class 3 severe obesity with serious comorbidity and body mass index (BMI) of 45.0 to 49.9 in adult ([place]) 01/30/2016  . History of pulmonary embolus (PE) 12/19/2013  . Anxiety state 03/29/2007  . Major depressive disorder 03/29/2007  . OSA (obstructive sleep apnea) 03/29/2007  . Unspecified glaucoma 03/29/2007  . BPH (benign prostatic hyperplasia) 03/29/2007  . OA (osteoarthritis) of knee 03/29/2007  . Essential hypertension 03/29/2007  . History of Bell's palsy 03/29/2007    Dennisha Mouser, PTA 08/19/2020, 11:01 AM  [facility] [address] [place], [place], [address] Phone: [phone]   Fax:  [phone]  Name: Tanner [name] MRN: [id number] Date of Birth: 28-May-1946

## 2020-08-20 ENCOUNTER — Other Ambulatory Visit: Payer: Self-pay

## 2020-08-20 ENCOUNTER — Encounter (HOSPITAL_COMMUNITY)
Admission: RE | Admit: 2020-08-20 | Discharge: 2020-08-20 | Disposition: A | Discharge status: Home / Self Care | Payer: Medicare HMO | Source: Ambulatory Visit | Attending: Orthopedic Surgery | Admitting: Orthopedic Surgery

## 2020-08-20 ENCOUNTER — Encounter (HOSPITAL_COMMUNITY): Payer: Self-pay

## 2020-08-20 ENCOUNTER — Ambulatory Visit (HOSPITAL_COMMUNITY)
Admission: RE | Admit: 2020-08-20 | Discharge: 2020-08-20 | Disposition: A | Discharge status: Home / Self Care | Payer: Medicare HMO | Source: Ambulatory Visit | Attending: Orthopedic Surgery | Admitting: Orthopedic Surgery

## 2020-08-20 DIAGNOSIS — Z7901 Long term (current) use of anticoagulants: Secondary | ICD-10-CM | POA: Insufficient documentation

## 2020-08-20 DIAGNOSIS — Z87891 Personal history of nicotine dependence: Secondary | ICD-10-CM | POA: Diagnosis not present

## 2020-08-20 DIAGNOSIS — M1712 Unilateral primary osteoarthritis, left knee: Secondary | ICD-10-CM | POA: Diagnosis not present

## 2020-08-20 DIAGNOSIS — Z01818 Encounter for other preprocedural examination: Secondary | ICD-10-CM | POA: Insufficient documentation

## 2020-08-20 DIAGNOSIS — Z79899 Other long term (current) drug therapy: Secondary | ICD-10-CM | POA: Diagnosis not present

## 2020-08-20 HISTORY — DX: Pneumonia, unspecified organism: J18.9

## 2020-08-20 HISTORY — DX: Anxiety disorder, unspecified: F41.9

## 2020-08-20 LAB — URINALYSIS, ROUTINE W REFLEX MICROSCOPIC
Bilirubin Urine: NEGATIVE
Glucose, UA: NEGATIVE mg/dL
Hgb urine dipstick: NEGATIVE
Ketones, ur: NEGATIVE mg/dL
Leukocytes,Ua: NEGATIVE
Nitrite: NEGATIVE
Protein, ur: NEGATIVE mg/dL
Specific Gravity, Urine: 1.014 (ref 1.005–1.030)
pH: 5 (ref 5.0–8.0)

## 2020-08-20 LAB — BASIC METABOLIC PANEL
Anion gap: 9 (ref 5–15)
BUN: 19 mg/dL (ref 8–23)
CO2: 29 mmol/L (ref 22–32)
Calcium: 9.7 mg/dL (ref 8.9–10.3)
Chloride: 102 mmol/L (ref 98–111)
Creatinine, Ser: 1.48 mg/dL — ABNORMAL HIGH (ref 0.61–1.24)
GFR, Estimated: 49 mL/min — ABNORMAL LOW (ref >60)
Glucose, Bld: 89 mg/dL (ref 70–99)
Potassium: 3.8 mmol/L (ref 3.5–5.1)
Sodium: 140 mmol/L (ref 135–145)

## 2020-08-20 LAB — CBC WITH DIFFERENTIAL/PLATELET
Abs Immature Granulocytes: 0.04 10*3/uL (ref 0.00–0.07)
Basophils Absolute: 0.1 10*3/uL (ref 0.0–0.1)
Basophils Relative: 1 %
Eosinophils Absolute: 0.2 10*3/uL (ref 0.0–0.5)
Eosinophils Relative: 3 %
HCT: 48.8 % (ref 39.0–52.0)
Hemoglobin: 15.5 g/dL (ref 13.0–17.0)
Immature Granulocytes: 1 %
Lymphocytes Relative: 24 %
Lymphs Abs: 2.1 10*3/uL (ref 0.7–4.0)
MCH: 28.8 pg (ref 26.0–34.0)
MCHC: 31.8 g/dL (ref 30.0–36.0)
MCV: 90.7 fL (ref 80.0–100.0)
Monocytes Absolute: 0.6 10*3/uL (ref 0.1–1.0)
Monocytes Relative: 6 %
Neutro Abs: 5.8 10*3/uL (ref 1.7–7.7)
Neutrophils Relative %: 65 %
Platelets: 174 10*3/uL (ref 150–400)
RBC: 5.38 MIL/uL (ref 4.22–5.81)
RDW: 14.6 % (ref 11.5–15.5)
WBC: 8.8 10*3/uL (ref 4.0–10.5)
nRBC: 0 % (ref 0.0–0.2)

## 2020-08-20 LAB — HEMOGLOBIN A1C
Hgb A1c MFr Bld: 5.1 % (ref 4.8–5.6)
Mean Plasma Glucose: 99.67 mg/dL

## 2020-08-20 LAB — APTT: aPTT: 38 seconds — ABNORMAL HIGH (ref 24–36)

## 2020-08-20 LAB — SURGICAL PCR SCREEN
MRSA, PCR: NEGATIVE
Staphylococcus aureus: NEGATIVE

## 2020-08-20 LAB — GLUCOSE, CAPILLARY: Glucose-Capillary: 89 mg/dL (ref 70–99)

## 2020-08-20 LAB — PROTIME-INR
INR: 1.4 — ABNORMAL HIGH (ref 0.8–1.2)
Prothrombin Time: 16.5 seconds — ABNORMAL HIGH (ref 11.4–15.2)

## 2020-08-20 NOTE — Progress Notes (Deleted)
Anesthesia Review:  PCP:  Cardiologist : Dr Loralie Champagne Lov- 07/16/20 ICe- Last device check 07/26/20  Chest x-ray : 03/27/20- One View  EKG :07/16/20 Echo : 07/16/20  Stress test:08/13/20 Cardiac Cath :  Activity level:  Sleep Study/ CPAP : Fasting Blood Sugar :      / Checks Blood Sugar -- times a day:   Blood Thinner/ Instructions /Last Dose: ASA / Instructions/ Last Dose :  DM- type 2

## 2020-08-20 NOTE — Care Plan (Signed)
Ortho Bundle Case Management Note  Patient Details  Name: Tanner [name] MRN: [id number] Date of Birth: 1946-06-08  Spoke with patient prior to surgery. She will discharge to home with family to assist. Has walker. HHPT referral to [place] [facility] and OPPT set up with [facility] [place] [facility]. Patient and MD in agreement with plan. Choice offered.               DME Arranged:    DME Agency:     HH Arranged:  PT HH Agency:  [facility]  Additional Comments: Please contact me with any questions of if this plan should need to change.  Ladell Heads,  [facility]  [phone] 08/20/2020, 2:07 PM

## 2020-08-20 NOTE — Progress Notes (Addendum)
       Anesthesia Review:  PCP: DR Pricilla Holm  LOv 07/23/20  Cardiologist : Chest x-ray :08/20/20 EKG :10/12/2019  Echo : 2018  Stress test: Cardiac Cath :  Activity level: can do a flight of stsairs without difficulty  Sleep Study/ CPAP : no  Fasting Blood Sugar :      / Checks Blood Sugar -- times a day:   Blood Thinner/ Instructions /Last Dose: ASA / Instructions/ Last Dose :  eliquis- to stop 2 days prior to surgery  PT/Ptt done 08/20/2020 routed to DR rowan.  DM- type 2 - does not check glucose at home  hgba1c-08/20/20-5.1

## 2020-08-21 ENCOUNTER — Encounter: Payer: Self-pay | Admitting: Physical Therapy

## 2020-08-21 ENCOUNTER — Ambulatory Visit: Payer: Medicare HMO | Admitting: Physical Therapy

## 2020-08-21 DIAGNOSIS — G8929 Other chronic pain: Secondary | ICD-10-CM | POA: Diagnosis not present

## 2020-08-21 DIAGNOSIS — R600 Localized edema: Secondary | ICD-10-CM | POA: Diagnosis not present

## 2020-08-21 DIAGNOSIS — M6281 Muscle weakness (generalized): Secondary | ICD-10-CM

## 2020-08-21 DIAGNOSIS — R296 Repeated falls: Secondary | ICD-10-CM | POA: Diagnosis not present

## 2020-08-21 DIAGNOSIS — M25562 Pain in left knee: Secondary | ICD-10-CM | POA: Diagnosis not present

## 2020-08-21 NOTE — H&P (Signed)
TOTAL KNEE ADMISSION H&P  Patient is being admitted for left total knee arthroplasty.  Subjective:  Chief Complaint:left knee pain.  HPI: Tanner [name], 75 y.o. male, has a history of pain and functional disability in the left knee due to arthritis and has failed non-surgical conservative treatments for greater than 12 weeks to includeNSAID's and/or analgesics, corticosteriod injections, viscosupplementation injections, flexibility and strengthening excercises, use of assistive devices and weight reduction as appropriate.  Onset of symptoms was gradual, starting several years ago with gradually worsening course since that time. The patient noted no past surgery on the left knee(s).  Patient currently rates pain in the left knee(s) at 10 out of 10 with activity. Patient has night pain, worsening of pain with activity and weight bearing, pain that interferes with activities of daily living, pain with passive range of motion, crepitus and joint swelling.  Patient has evidence of joint space narrowing by imaging studies.   There is no active infection.  Patient Active Problem List   Diagnosis Date Noted  . Hypertension associated with type 2 diabetes mellitus ([place]) 05/23/2020  . Chronic kidney disease (CKD), stage III (moderate) ([place]) 12/13/2019  . Carpal tunnel syndrome 12/05/2019  . Elevated creatine kinase 11/15/2019  . Aortic atherosclerosis ([place]) 11/15/2019  . Recurrent Clostridium difficile diarrhea 05/28/2017  . Peripheral neuropathy 03/17/2017  . Diabetes ([place]) 12/11/2016  . Allergic rhinitis 12/11/2016  . Degenerative arthritis of left knee 01/30/2016  . Class 3 severe obesity with serious comorbidity and body mass index (BMI) of 45.0 to 49.9 in adult ([place]) 01/30/2016  . History of pulmonary embolus (PE) 12/19/2013  . Anxiety state 03/29/2007  . Major depressive disorder 03/29/2007  . OSA (obstructive sleep apnea) 03/29/2007  . Unspecified glaucoma 03/29/2007  . BPH (benign  prostatic hyperplasia) 03/29/2007  . OA (osteoarthritis) of knee 03/29/2007  . Essential hypertension 03/29/2007  . History of Bell's palsy 03/29/2007   Past Medical History:  Diagnosis Date  . Acute kidney failure ([place])   . Anxiety   . Arthritis   . Back pain   . BPH (benign prostatic hypertrophy)   . Clostridium difficile infection   . Clotting disorder ([place])   . Depression   . Diabetes ([place]) 12/11/2016   type 2   . DVT (deep venous thrombosis) ([place])   . DVT of deep femoral vein, right ([place]) 06/29/2018  . Edema, lower extremity   . High cholesterol   . Hypertension   . Joint pain   . Low back pain potentially associated with spinal stenosis   . Neuromuscular disorder ([place])   . Neuropathy of lower extremity    bilateral  . OSA (obstructive sleep apnea)    pt denies   . Osteoarthritis   . PE (pulmonary embolism)   . Pneumonia    hx of x 2   . Ventral hernia     Past Surgical History:  Procedure Laterality Date  . EYE SURGERY    . HERNIA REPAIR  1999    No current facility-administered medications for this encounter.   Current Outpatient Medications  Medication Sig Dispense Refill Last Dose  . acetaminophen (TYLENOL) 500 MG tablet Take 1,000 mg by mouth every 6 (six) hours as needed for moderate pain.     Marland Kitchen acidophilus (RISAQUAD) CAPS capsule Take 1 capsule by mouth daily.     Marland Kitchen apixaban (ELIQUIS) 5 MG TABS tablet Take 1 tablet (5 mg total) by mouth 2 (two) times daily. 60 tablet 12   .  atenolol (TENORMIN) 25 MG tablet Take 12.5 mg by mouth daily.     Marland Kitchen atorvastatin (LIPITOR) 80 MG tablet Take 40 mg by mouth daily.     Marland Kitchen buPROPion (WELLBUTRIN SR) 150 MG 12 hr tablet Take 150 mg by mouth 2 (two) times daily.     . finasteride (PROSCAR) 5 MG tablet Take 5 mg by mouth at bedtime.     . furosemide (LASIX) 20 MG tablet Take 2 tablets (40 mg total) by mouth 2 (two) times daily. (Patient taking differently: Take 40 mg by mouth daily.) 120 tablet 0   . gabapentin (NEURONTIN)  300 MG capsule Take 300-600 mg by mouth See admin instructions. Take 600 mg by mouth three times daily and  300 mg at night     . ketoconazole (NIZORAL) 2 % shampoo Apply 1 application topically every other day.     . Menthol-Methyl Salicylate (THERA-GESIC PLUS) CREA Apply 1 application topically at bedtime. Apply to lower back     . Multiple Vitamins-Minerals (MULTIVITAMINS THER. W/MINERALS) TABS Take 1 tablet by mouth daily.     . nortriptyline (PAMELOR) 10 MG capsule 2 CAPSULES EVERY NIGHT AT BEDTIME (Patient taking differently: Take 20 mg by mouth at bedtime.) 180 capsule 0   . potassium chloride (KLOR-CON) 10 MEQ tablet Take 4 tablets (40 mEq total) by mouth 2 (two) times daily. (Patient taking differently: Take 20 mEq by mouth daily.) 240 tablet 0   . Semaglutide,0.25 or 0.5MG /DOS, (OZEMPIC, 0.25 OR 0.5 MG/DOSE,) 2 MG/1.5ML SOPN Inject 0.5 mg into the skin once a week. (Patient taking differently: Inject 0.5 mg into the skin once a week. PT TAKES ON Wednesday) 4.5 mL 3   . neomycin-polymyxin-hydrocortisone (CORTISPORIN) 3.5-10000-1 OTIC suspension Place 2 drops into both ears daily. (Patient not taking: Reported on 08/09/2020)   Not Taking at Unknown time  . saccharomyces boulardii (FLORASTOR) 250 MG capsule Take 1 capsule (250 mg total) by mouth 2 (two) times daily. (Patient not taking: Reported on 08/09/2020) 30 capsule 0 Not Taking at Unknown time   Allergies  Allergen Reactions  . Lisinopril     Other reaction(s): Renal impairment  . Amlodipine Swelling  . Codeine Sulfate Itching and Nausea Only    Social History   Tobacco Use  . Smoking status: Former Smoker    Packs/day: 2.00    Years: 33.00    Pack years: 66.00    Types: Cigarettes    Start date: 08/30/1968    Quit date: 07/02/2001    Years since quitting: 19.1  . Smokeless tobacco: Never Used  . Tobacco comment: quit 12 years ago  Substance Use Topics  . Alcohol use: Yes    Comment: seldom     Family History  Problem  Relation Age of Onset  . Diabetes Mother   . Pneumonia Mother   . Alzheimer's disease Mother   . Hyperlipidemia Mother   . Thyroid disease Mother   . Depression Mother   . Other Father 49       Drowned on boating accident  . Colon cancer Neg Hx   . Colon polyps Neg Hx      Review of Systems  Constitutional: Negative.   HENT: Negative.   Eyes: Negative.   Respiratory: Negative.        Hx pulmonary emboli  Cardiovascular: Negative.   Gastrointestinal: Negative.   Endocrine: Negative.   Genitourinary: Negative.   Musculoskeletal: Positive for arthralgias and myalgias.  Skin: Negative.   Allergic/Immunologic: Negative.  Neurological: Negative.   Hematological: Negative.   Psychiatric/Behavioral: Negative.     Objective:  Physical Exam Constitutional:      Appearance: Normal appearance. He is obese.  HENT:     Head: Normocephalic and atraumatic.     Nose: Nose normal.  Eyes:     Pupils: Pupils are equal, round, and reactive to light.  Cardiovascular:     Pulses: Normal pulses.  Pulmonary:     Effort: Pulmonary effort is normal.  Musculoskeletal:        General: Tenderness present.     Cervical back: Normal range of motion and neck supple.     Comments: He continues to have her range from roughly 10 to 120.  Edema in bilateral lower legs.  He continues to use a single-point cane for ambulation.  Skin:    General: Skin is warm and dry.  Neurological:     General: No focal deficit present.     Mental Status: He is alert and oriented to person, place, and time. Mental status is at baseline.  Psychiatric:        Mood and Affect: Mood normal.        Behavior: Behavior normal.        Thought Content: Thought content normal.        Judgment: Judgment normal.     Vital signs in last 24 hours: Temp:  [97.5 F (36.4 C)] 97.5 F (36.4 C) (03/08 1323) Pulse Rate:  [70] 70 (03/08 1323) Resp:  [16] 16 (03/08 1323) BP: (124)/(58) 124/58 (03/08 1323) SpO2:  [98 %] 98  % (03/08 1323)  Labs:   Estimated body mass index is 40.15 kg/m as calculated from the following:   Height as of 07/24/20: 5' 5.5" (1.664 m).   Weight as of 07/24/20: 111.1 kg.   Imaging Review Plain radiographs demonstrate bilateral AP weightbearing, bilateral Rosenberg, lateral sunrise views of the left knee are taken and reviewed in office today.  Patient does have end-stage bone-on-bone arthritis of the medial compartment.   Assessment/Plan:  End stage arthritis, left knee   The patient history, physical examination, clinical judgment of the provider and imaging studies are consistent with end stage degenerative joint disease of the left knee(s) and total knee arthroplasty is deemed medically necessary. The treatment options including medical management, injection therapy arthroscopy and arthroplasty were discussed at length. The risks and benefits of total knee arthroplasty were presented and reviewed. The risks due to aseptic loosening, infection, stiffness, patella tracking problems, thromboembolic complications and other imponderables were discussed. The patient acknowledged the explanation, agreed to proceed with the plan and consent was signed. Patient is being admitted for inpatient treatment for surgery, pain control, PT, OT, prophylactic antibiotics, VTE prophylaxis, progressive ambulation and ADL's and discharge planning. The patient is planning to be discharged home with home health services     Patient's anticipated LOS is less than 2 midnights, meeting these requirements: - Lives within 1 hour of care - Has a competent adult at home to recover with post-op recover - NO history of  - Chronic pain requiring opiods  - Diabetes  - Coronary Artery Disease  - Heart failure  - Heart attack  - Stroke  - DVT/VTE  - Cardiac arrhythmia  - Respiratory Failure/COPD  - Renal failure  - Anemia  - Advanced Liver disease

## 2020-08-21 NOTE — Therapy (Signed)
[facility] [address], [place], [place], [address] Phone: [phone]   Fax:  [phone]  Physical Therapy Treatment  Patient Details  Name: Tanner [name] MRN: [id number] Date of Birth: June 09, 1946 Referring Provider (PT): Briscoe Deutscher, DO   Encounter Date: 08/21/2020   PT End of Session - 08/21/20 1103    Visit Number 15    Date for PT Re-Evaluation 09/20/20    Authorization Type [employer]    Progress Note Due on Visit 20    PT Start Time 1054    PT Stop Time 1140    PT Time Calculation (min) 46 min    Activity Tolerance Patient tolerated treatment well    Behavior During Therapy [facility] for tasks assessed/performed           Past Medical History:  Diagnosis Date  . Acute kidney failure ([place])   . Anxiety   . Arthritis   . Back pain   . BPH (benign prostatic hypertrophy)   . Clostridium difficile infection   . Clotting disorder ([place])   . Depression   . Diabetes ([place]) 12/11/2016   type 2   . DVT (deep venous thrombosis) ([place])   . DVT of deep femoral vein, right ([place]) 06/29/2018  . Edema, lower extremity   . High cholesterol   . Hypertension   . Joint pain   . Low back pain potentially associated with spinal stenosis   . Neuromuscular disorder ([place])   . Neuropathy of lower extremity    bilateral  . OSA (obstructive sleep apnea)    pt denies   . Osteoarthritis   . PE (pulmonary embolism)   . Pneumonia    hx of x 2   . Ventral hernia     Past Surgical History:  Procedure Laterality Date  . EYE SURGERY    . HERNIA REPAIR  1999    There were no vitals filed for this visit.   Subjective Assessment - 08/21/20 1104    Subjective Surgery is next Monday.  Knee isn't too bad today.  3-4/10.    Pertinent History Lt knee OA with anticipated TKR in March    Limitations Walking    How long can you walk comfortably? 5 min    Patient Stated Goals get stronger and better balance    Currently in  Pain? Yes    Pain Score 4     Pain Location Knee    Pain Orientation Left    Pain Descriptors / Indicators Throbbing;Sore    Pain Type Chronic pain    Pain Onset More than a month ago    Pain Frequency Constant    Aggravating Factors  weightbearing    Pain Relieving Factors sitting              [facility] PT Assessment - 08/21/20 0001      Assessment   Medical Diagnosis R26.89 (ICD-10-CM) - Imbalance    Referring Provider (PT) Briscoe Deutscher, DO    Onset Date/Surgical Date --   2 years   Next MD Visit next week    Prior Therapy no      Precautions   Precautions Fall      AROM   AROM Assessment Site Knee    Right/Left Knee Right;Left    Right Knee Flexion 127    Left Knee Flexion 120      Strength   Overall Strength Comments weak core with diastatsis, Rt hip 5/5  Left Hip Flexion 4/5    Left Hip Extension 4/5    Left Hip ABduction 4/5    Right Knee Flexion 5/5    Right Knee Extension 5/5    Left Knee Flexion 5/5    Left Knee Extension 5/5      Standardized Balance Assessment   Five times sit to stand comments  11 sec, hands on thighs      Berg Balance Test   Sit to Stand Able to stand without using hands and stabilize independently    Standing Unsupported Able to stand safely 2 minutes    Sitting with Back Unsupported but Feet Supported on Floor or Stool Able to sit safely and securely 2 minutes    Stand to Sit Sits safely with minimal use of hands    Transfers Able to transfer safely, minor use of hands    Standing Unsupported with Eyes Closed Able to stand 10 seconds safely    Standing Unsupported with Feet Together Able to place feet together independently and stand 1 minute safely    From Standing, Reach Forward with Outstretched Arm Can reach confidently >25 cm (10")    From Standing Position, Pick up Object from Floor Able to pick up shoe safely and easily    From Standing Position, Turn to Look Behind Over each Shoulder Looks behind one side only/other side  shows less weight shift    Turn 360 Degrees Able to turn 360 degrees safely one side only in 4 seconds or less    Standing Unsupported, Alternately Place Feet on Step/Stool Able to stand independently and safely and complete 8 steps in 20 seconds    Standing Unsupported, One Foot in Front Able to plae foot ahead of the other independently and hold 30 seconds    Standing on One Leg Tries to lift leg/unable to hold 3 seconds but remains standing independently    Total Score 50    Berg comment: 50/56      Timed Up and Go Test   TUG Normal TUG    Normal TUG (seconds) 10    TUG Comments with Rt UE cane                         [facility] Adult PT Treatment/Exercise - 08/21/20 0001      Knee/Hip Exercises: Aerobic   Nustep L4 x 14 min PT present to review goals      Knee/Hip Exercises: Machines for Strengthening   Total Gym Leg Press 70lb bil LEs 2x20      Knee/Hip Exercises: Seated   Clamshell with TheraBand Red   20 reps x 2   Marching Strengthening;20 reps;2 sets;Weights    Marching Limitations 4lb weights on thighs, VC to keep trunk level and core engaged    Hamstring Curl Strengthening;Both;10 reps;2 sets    Hamstring Limitations red band around ankles    Sit to Sand 3 sets;5 reps;without UE support                    PT Short Term Goals - 07/26/20 1020      PT SHORT TERM GOAL #1   Title Pt will be ind with HEP    Status Achieved      PT SHORT TERM GOAL #2   Title Pt will demo sit to stand followed by forward reach for cane 5x without LOB    Status Achieved      PT SHORT TERM  GOAL #3   Title 5x sit to stand without UE support in 15 sec or less             PT Long Term Goals - 08/21/20 1123      PT LONG TERM GOAL #1   Title Pt will be ind with advanced HEP and understand how to safely progress    Status Achieved      PT LONG TERM GOAL #2   Title Pt will improve BERG score to at least 48/56 to demo reduced falls.    Baseline 41/56 at eval,  50/56 07/26/20    Status Achieved      PT LONG TERM GOAL #3   Title Pt will report feeling at least 70% more confidence in his balance with stairs and transfers.    Baseline 15% improved confidence    Status Partially Met      PT LONG TERM GOAL #4   Title Pt will achieve at least 4+/5 strength in bil LEs and trunk to improve postural support and stability with transfers and stairs    Baseline 5/5 bil LEs, 4+/5 trunk    Status Achieved                 Plan - 08/21/20 1130    Clinical Impression Statement Pt is having Lt TKR next Monday so d/c PT today.  Pt has met or partially met all LTGs.  He reports 15% improved confidence in balance and strength with PT.  He feels limited by Lt knee pain and stability.  LE strength has improved to 5/5.  BERG balance has improved to 50/56 from 41/56 indicating less fall risk.  He shaved off 1 sec from 5x sit to stand to 11 sec, and 2 seconds off TUG test from 12 sec down to 10 sec.  PT reviewed initial expecations for post-op ther ex following TKR today.  Pre-op measurement for Lt knee flexion is 120 deg (compared to Rt knee flexion 127 deg).  Pt will return to clinic for evaluation after Lt knee surgery for post-op PT.    Comorbidities DM neuropathy, obesity, Lt knee OA    PT Frequency 2x / week    PT Duration 8 weeks    PT Next Visit Plan d/c to HEP, Lt knee TKR next Mon, Pt will return for post-op evalution    PT Home Exercise Plan Access Code: [email or web address]    Consulted and Agree with Plan of Care Patient           Patient will benefit from skilled therapeutic intervention in order to improve the following deficits and impairments:  Abnormal gait,Decreased range of motion,Difficulty walking,Obesity,Decreased endurance,Pain,Decreased activity tolerance,Decreased balance,Improper body mechanics,Postural dysfunction,Decreased strength,Decreased mobility  Visit Diagnosis: Repeated falls  Muscle weakness (generalized)     Problem List Patient  Active Problem List   Diagnosis Date Noted  . Hypertension associated with type 2 diabetes mellitus (HCC) 05/23/2020  . Chronic kidney disease (CKD), stage III (moderate) (HCC) 12/13/2019  . Carpal tunnel syndrome 12/05/2019  . Elevated creatine kinase 11/15/2019  . Aortic atherosclerosis (HCC) 11/15/2019  . Recurrent Clostridium difficile diarrhea 05/28/2017  . Peripheral neuropathy 03/17/2017  . Diabetes (HCC) 12/11/2016  . Allergic rhinitis 12/11/2016  . Degenerative arthritis of left knee 01/30/2016  . Class 3 severe obesity with serious comorbidity and body mass index (BMI) of 45.0 to 49.9 in adult (HCC) 01/30/2016  . History of pulmonary embolus (PE) 12/19/2013  . Anxiety state 03/29/2007  . Major  depressive disorder 03/29/2007  . OSA (obstructive sleep apnea) 03/29/2007  . Unspecified glaucoma 03/29/2007  . BPH (benign prostatic hyperplasia) 03/29/2007  . OA (osteoarthritis) of knee 03/29/2007  . Essential hypertension 03/29/2007  . History of Bell's palsy 03/29/2007    PHYSICAL THERAPY DISCHARGE SUMMARY  Visits from Start of Care: 15  Current functional level related to goals / functional outcomes: See above.  Pt scheduled to have Lt TKR next week.  D/C PT with anticipated return for post-op PT in several weeks.   Remaining deficits: See above   Education / Equipment: HEP Plan: Patient agrees to discharge.  Patient goals were partially met. Patient is being discharged due to a change in medical status.  ?????         Baruch Merl, PT 08/21/20 11:41 AM   [facility] [address], [place], [place], [address] Phone: [phone]   Fax:  [phone]  Name: Tanner [name] MRN: [id number] Date of Birth: Dec 06, 1945

## 2020-08-22 ENCOUNTER — Other Ambulatory Visit (HOSPITAL_COMMUNITY)
Admission: RE | Admit: 2020-08-22 | Discharge: 2020-08-22 | Disposition: A | Discharge status: Home / Self Care | Payer: Medicare HMO | Source: Ambulatory Visit | Attending: Orthopedic Surgery | Admitting: Orthopedic Surgery

## 2020-08-22 DIAGNOSIS — Z20822 Contact with and (suspected) exposure to covid-19: Secondary | ICD-10-CM | POA: Insufficient documentation

## 2020-08-22 DIAGNOSIS — Z01812 Encounter for preprocedural laboratory examination: Secondary | ICD-10-CM | POA: Diagnosis not present

## 2020-08-22 LAB — SARS CORONAVIRUS 2 (TAT 6-24 HRS): SARS Coronavirus 2: NEGATIVE

## 2020-08-25 MED ORDER — TRANEXAMIC ACID 1000 MG/10ML IV SOLN
2000.0000 mg | INTRAVENOUS | Status: DC
  Filled 2020-08-25: qty 20

## 2020-08-25 MED ORDER — BUPIVACAINE LIPOSOME 1.3 % IJ SUSP
20.0000 mL | Freq: Once | INTRAMUSCULAR | Status: DC
  Filled 2020-08-25: qty 20

## 2020-08-25 NOTE — Anesthesia Preprocedure Evaluation (Addendum)
Anesthesia Evaluation  Patient identified by MRN, date of birth, ID band Patient awake    Reviewed: Allergy & Precautions, NPO status , Patient's Chart, lab work & pertinent test results  History of Anesthesia Complications Negative for: history of anesthetic complications  Airway Mallampati: I  TM Distance: >3 FB Neck ROM: Full    Dental  (+) Edentulous Upper, Edentulous Lower, Dental Advisory Given   Pulmonary sleep apnea , former smoker, PE   Pulmonary exam normal        Cardiovascular hypertension, Pt. on home beta blockers and Pt. on medications + DVT  Normal cardiovascular exam  Impressions: Echo 2018  - Mild LVH with LVEF 55-60% and indeterminate diastolic function.  Trivial mitral regurgitation and tricuspid regurgitation.    Neuro/Psych PSYCHIATRIC DISORDERS Anxiety Depression negative neurological ROS     GI/Hepatic negative GI ROS, Neg liver ROS,   Endo/Other  diabetes  Renal/GU Renal InsufficiencyRenal disease     Musculoskeletal negative musculoskeletal ROS (+)   Abdominal   Peds  Hematology  (+) Blood dyscrasia, ,   Anesthesia Other Findings   Reproductive/Obstetrics                           Anesthesia Physical Anesthesia Plan  ASA: III  Anesthesia Plan: General   Post-op Pain Management:  Regional for Post-op pain   Induction: Intravenous  PONV Risk Score and Plan: 3 and Ondansetron, Dexamethasone and Diphenhydramine  Airway Management Planned: LMA  Additional Equipment:   Intra-op Plan:   Post-operative Plan: Extubation in OR  Informed Consent: I have reviewed the patients History and Physical, chart, labs and discussed the procedure including the risks, benefits and alternatives for the proposed anesthesia with the patient or authorized representative who has indicated his/her understanding and acceptance.     Dental advisory given  Plan Discussed  with: Anesthesiologist and CRNA  Anesthesia Plan Comments:       Anesthesia Quick Evaluation

## 2020-08-26 ENCOUNTER — Ambulatory Visit (HOSPITAL_COMMUNITY)
Admission: RE | Admit: 2020-08-26 | Discharge: 2020-08-26 | Disposition: A | Discharge status: Home / Self Care | Payer: Medicare HMO | Source: Ambulatory Visit | Attending: Orthopedic Surgery | Admitting: Orthopedic Surgery

## 2020-08-26 ENCOUNTER — Encounter (HOSPITAL_COMMUNITY): Payer: Self-pay | Admitting: Orthopedic Surgery

## 2020-08-26 ENCOUNTER — Ambulatory Visit (INDEPENDENT_AMBULATORY_CARE_PROVIDER_SITE_OTHER): Payer: Medicare HMO | Admitting: Family Medicine

## 2020-08-26 ENCOUNTER — Encounter (HOSPITAL_COMMUNITY)
Admission: RE | Disposition: A | Discharge status: Home / Self Care | Payer: Self-pay | Source: Ambulatory Visit | Attending: Orthopedic Surgery

## 2020-08-26 ENCOUNTER — Ambulatory Visit (HOSPITAL_COMMUNITY): Payer: Medicare HMO | Admitting: Physician Assistant

## 2020-08-26 ENCOUNTER — Ambulatory Visit (HOSPITAL_COMMUNITY): Payer: Medicare HMO | Admitting: Certified Registered"

## 2020-08-26 DIAGNOSIS — Z7901 Long term (current) use of anticoagulants: Secondary | ICD-10-CM | POA: Diagnosis not present

## 2020-08-26 DIAGNOSIS — M1712 Unilateral primary osteoarthritis, left knee: Secondary | ICD-10-CM | POA: Diagnosis not present

## 2020-08-26 DIAGNOSIS — N183 Chronic kidney disease, stage 3 unspecified: Secondary | ICD-10-CM | POA: Diagnosis not present

## 2020-08-26 DIAGNOSIS — Z87891 Personal history of nicotine dependence: Secondary | ICD-10-CM | POA: Diagnosis not present

## 2020-08-26 DIAGNOSIS — E1122 Type 2 diabetes mellitus with diabetic chronic kidney disease: Secondary | ICD-10-CM | POA: Diagnosis not present

## 2020-08-26 DIAGNOSIS — I129 Hypertensive chronic kidney disease with stage 1 through stage 4 chronic kidney disease, or unspecified chronic kidney disease: Secondary | ICD-10-CM | POA: Diagnosis not present

## 2020-08-26 DIAGNOSIS — Z79899 Other long term (current) drug therapy: Secondary | ICD-10-CM | POA: Insufficient documentation

## 2020-08-26 DIAGNOSIS — Z885 Allergy status to narcotic agent status: Secondary | ICD-10-CM | POA: Insufficient documentation

## 2020-08-26 DIAGNOSIS — Z01818 Encounter for other preprocedural examination: Secondary | ICD-10-CM

## 2020-08-26 DIAGNOSIS — Z888 Allergy status to other drugs, medicaments and biological substances status: Secondary | ICD-10-CM | POA: Diagnosis not present

## 2020-08-26 DIAGNOSIS — G8918 Other acute postprocedural pain: Secondary | ICD-10-CM | POA: Diagnosis not present

## 2020-08-26 HISTORY — PX: TOTAL KNEE ARTHROPLASTY: SHX125

## 2020-08-26 LAB — TYPE AND SCREEN
ABO/RH(D): A POS
Antibody Screen: NEGATIVE

## 2020-08-26 LAB — ABO/RH: ABO/RH(D): A POS

## 2020-08-26 LAB — GLUCOSE, CAPILLARY
Glucose-Capillary: 93 mg/dL (ref 70–99)
Glucose-Capillary: 113 mg/dL — ABNORMAL HIGH (ref 70–99)

## 2020-08-26 LAB — APTT: aPTT: 28 seconds (ref 24–36)

## 2020-08-26 LAB — PROTIME-INR
INR: 1.1 (ref 0.8–1.2)
Prothrombin Time: 13.5 seconds (ref 11.4–15.2)

## 2020-08-26 SURGERY — ARTHROPLASTY, KNEE, TOTAL
Anesthesia: General | Site: Knee | Laterality: Left

## 2020-08-26 MED ORDER — KCL IN DEXTROSE-NACL 20-5-0.45 MEQ/L-%-% IV SOLN: INTRAVENOUS | Status: DC

## 2020-08-26 MED ORDER — ORAL CARE MOUTH RINSE: 15.0000 mL | Freq: Once | OROMUCOSAL | Status: AC

## 2020-08-26 MED ORDER — CHLORHEXIDINE GLUCONATE 0.12 % MT SOLN
15.0000 mL | Freq: Once | OROMUCOSAL | Status: AC
  Administered 2020-08-26: 15 mL via OROMUCOSAL

## 2020-08-26 MED ORDER — POTASSIUM CHLORIDE ER 10 MEQ PO TBCR: 20.0000 meq | EXTENDED_RELEASE_TABLET | Freq: Every day | ORAL | Status: DC

## 2020-08-26 MED ORDER — ROPIVACAINE HCL 7.5 MG/ML IJ SOLN
INTRAMUSCULAR | Status: DC | PRN
  Administered 2020-08-26: 20 mL via PERINEURAL

## 2020-08-26 MED ORDER — LACTATED RINGERS IV SOLN: INTRAVENOUS | Status: DC

## 2020-08-26 MED ORDER — FENTANYL CITRATE (PF) 100 MCG/2ML IJ SOLN: 25.0000 ug | INTRAMUSCULAR | Status: DC | PRN

## 2020-08-26 MED ORDER — ALUM & MAG HYDROXIDE-SIMETH 200-200-20 MG/5ML PO SUSP: 30.0000 mL | ORAL | Status: DC | PRN

## 2020-08-26 MED ORDER — GABAPENTIN 300 MG PO CAPS: 300.0000 mg | ORAL_CAPSULE | ORAL | Status: DC

## 2020-08-26 MED ORDER — BUPIVACAINE-EPINEPHRINE (PF) 0.25% -1:200000 IJ SOLN
INTRAMUSCULAR | Status: DC | PRN
  Administered 2020-08-26 (×2): 15 mL

## 2020-08-26 MED ORDER — OXYCODONE-ACETAMINOPHEN 5-325 MG PO TABS: 1.0000 | ORAL_TABLET | ORAL | 0 refills | Status: DC | PRN

## 2020-08-26 MED ORDER — APIXABAN 2.5 MG PO TABS: 2.5000 mg | ORAL_TABLET | Freq: Two times a day (BID) | ORAL | Status: DC

## 2020-08-26 MED ORDER — EPHEDRINE SULFATE-NACL 50-0.9 MG/10ML-% IV SOSY
PREFILLED_SYRINGE | INTRAVENOUS | Status: DC | PRN
  Administered 2020-08-26: 10 mg via INTRAVENOUS
  Administered 2020-08-26 (×2): 5 mg via INTRAVENOUS
  Administered 2020-08-26: 10 mg via INTRAVENOUS
  Administered 2020-08-26: 5 mg via INTRAVENOUS

## 2020-08-26 MED ORDER — BUPIVACAINE-EPINEPHRINE (PF) 0.25% -1:200000 IJ SOLN
INTRAMUSCULAR | Status: AC
  Filled 2020-08-26: qty 30

## 2020-08-26 MED ORDER — LACTATED RINGERS IV BOLUS
500.0000 mL | Freq: Once | INTRAVENOUS | Status: AC
  Administered 2020-08-26: 500 mL via INTRAVENOUS

## 2020-08-26 MED ORDER — OXYCODONE HCL 5 MG PO TABS: 10.0000 mg | ORAL_TABLET | ORAL | Status: DC | PRN

## 2020-08-26 MED ORDER — OXYCODONE HCL 5 MG PO TABS: 5.0000 mg | ORAL_TABLET | ORAL | Status: DC | PRN

## 2020-08-26 MED ORDER — ACETAMINOPHEN 500 MG PO TABS: 1000.0000 mg | ORAL_TABLET | Freq: Four times a day (QID) | ORAL | Status: DC

## 2020-08-26 MED ORDER — ONDANSETRON HCL 4 MG/2ML IJ SOLN
INTRAMUSCULAR | Status: AC
  Filled 2020-08-26: qty 2

## 2020-08-26 MED ORDER — FINASTERIDE 5 MG PO TABS: 5.0000 mg | ORAL_TABLET | Freq: Every day | ORAL | Status: DC

## 2020-08-26 MED ORDER — SODIUM CHLORIDE (PF) 0.9 % IJ SOLN
INTRAMUSCULAR | Status: DC | PRN
  Administered 2020-08-26 (×2): 25 mL

## 2020-08-26 MED ORDER — ONDANSETRON HCL 4 MG/2ML IJ SOLN
INTRAMUSCULAR | Status: DC | PRN
Start: 2020-08-26 — End: 2020-08-26
  Administered 2020-08-26: 4 mg via INTRAVENOUS

## 2020-08-26 MED ORDER — EPHEDRINE 5 MG/ML INJ
INTRAVENOUS | Status: AC
  Filled 2020-08-26: qty 10

## 2020-08-26 MED ORDER — APIXABAN 2.5 MG PO TABS: 2.5000 mg | ORAL_TABLET | Freq: Two times a day (BID) | ORAL | 0 refills | Status: DC

## 2020-08-26 MED ORDER — ACETAMINOPHEN 325 MG PO TABS: 325.0000 mg | ORAL_TABLET | Freq: Four times a day (QID) | ORAL | Status: DC | PRN

## 2020-08-26 MED ORDER — PHENYLEPHRINE HCL-NACL 10-0.9 MG/250ML-% IV SOLN
INTRAVENOUS | Status: AC
  Filled 2020-08-26: qty 250

## 2020-08-26 MED ORDER — AMISULPRIDE (ANTIEMETIC) 5 MG/2ML IV SOLN: 10.0000 mg | Freq: Once | INTRAVENOUS | Status: DC | PRN

## 2020-08-26 MED ORDER — MIDAZOLAM HCL 2 MG/2ML IJ SOLN
INTRAMUSCULAR | Status: AC
  Filled 2020-08-26: qty 2

## 2020-08-26 MED ORDER — PHENYLEPHRINE 40 MCG/ML (10ML) SYRINGE FOR IV PUSH (FOR BLOOD PRESSURE SUPPORT)
PREFILLED_SYRINGE | INTRAVENOUS | Status: AC
  Filled 2020-08-26: qty 10

## 2020-08-26 MED ORDER — METHOCARBAMOL 500 MG PO TABS: 500.0000 mg | ORAL_TABLET | Freq: Four times a day (QID) | ORAL | Status: DC | PRN

## 2020-08-26 MED ORDER — TIZANIDINE HCL 2 MG PO TABS: 2.0000 mg | ORAL_TABLET | Freq: Four times a day (QID) | ORAL | 0 refills | Status: DC | PRN

## 2020-08-26 MED ORDER — DIPHENHYDRAMINE HCL 12.5 MG/5ML PO ELIX: 12.5000 mg | ORAL_SOLUTION | ORAL | Status: DC | PRN

## 2020-08-26 MED ORDER — HYDROMORPHONE HCL 1 MG/ML IJ SOLN: 0.5000 mg | INTRAMUSCULAR | Status: DC | PRN

## 2020-08-26 MED ORDER — PROPOFOL 10 MG/ML IV BOLUS
INTRAVENOUS | Status: DC | PRN
  Administered 2020-08-26: 140 mg via INTRAVENOUS

## 2020-08-26 MED ORDER — POLYETHYLENE GLYCOL 3350 17 G PO PACK: 17.0000 g | PACK | Freq: Every day | ORAL | Status: DC | PRN

## 2020-08-26 MED ORDER — BUPIVACAINE LIPOSOME 1.3 % IJ SUSP
INTRAMUSCULAR | Status: DC | PRN
  Administered 2020-08-26 (×2): 10 mL

## 2020-08-26 MED ORDER — TRANEXAMIC ACID 1000 MG/10ML IV SOLN
INTRAVENOUS | Status: DC | PRN
  Administered 2020-08-26: 2000 mg via TOPICAL

## 2020-08-26 MED ORDER — WATER FOR IRRIGATION, STERILE IR SOLN
Status: DC | PRN
  Administered 2020-08-26: 2000 mL

## 2020-08-26 MED ORDER — FLEET ENEMA 7-19 GM/118ML RE ENEM: 1.0000 | ENEMA | Freq: Once | RECTAL | Status: DC | PRN

## 2020-08-26 MED ORDER — TRANEXAMIC ACID-NACL 1000-0.7 MG/100ML-% IV SOLN
1000.0000 mg | Freq: Once | INTRAVENOUS | Status: DC
Start: 2020-08-26 — End: 2020-08-26

## 2020-08-26 MED ORDER — ONDANSETRON HCL 4 MG/2ML IJ SOLN: 4.0000 mg | Freq: Four times a day (QID) | INTRAMUSCULAR | Status: DC | PRN

## 2020-08-26 MED ORDER — POVIDONE-IODINE 10 % EX SWAB
2.0000 "application " | Freq: Once | CUTANEOUS | Status: AC
  Administered 2020-08-26: 2 via TOPICAL

## 2020-08-26 MED ORDER — ATENOLOL 25 MG PO TABS: 12.5000 mg | ORAL_TABLET | Freq: Every day | ORAL | Status: DC

## 2020-08-26 MED ORDER — DEXAMETHASONE SODIUM PHOSPHATE 10 MG/ML IJ SOLN
INTRAMUSCULAR | Status: DC | PRN
  Administered 2020-08-26: 4 mg via INTRAVENOUS

## 2020-08-26 MED ORDER — FENTANYL CITRATE (PF) 100 MCG/2ML IJ SOLN
INTRAMUSCULAR | Status: AC
  Filled 2020-08-26: qty 2

## 2020-08-26 MED ORDER — CEFAZOLIN SODIUM-DEXTROSE 2-4 GM/100ML-% IV SOLN: 2.0000 g | INTRAVENOUS | Status: DC

## 2020-08-26 MED ORDER — TRANEXAMIC ACID-NACL 1000-0.7 MG/100ML-% IV SOLN
1000.0000 mg | INTRAVENOUS | Status: AC
  Administered 2020-08-26: 1000 mg via INTRAVENOUS
  Filled 2020-08-26: qty 100

## 2020-08-26 MED ORDER — TRANEXAMIC ACID-NACL 1000-0.7 MG/100ML-% IV SOLN: 1000.0000 mg | INTRAVENOUS | Status: DC

## 2020-08-26 MED ORDER — DEXAMETHASONE SODIUM PHOSPHATE 10 MG/ML IJ SOLN: 10.0000 mg | Freq: Once | INTRAMUSCULAR | Status: DC

## 2020-08-26 MED ORDER — ONDANSETRON HCL 4 MG PO TABS: 4.0000 mg | ORAL_TABLET | Freq: Every day | ORAL | 1 refills | Status: DC | PRN

## 2020-08-26 MED ORDER — BUPROPION HCL ER (SR) 150 MG PO TB12: 150.0000 mg | ORAL_TABLET | Freq: Two times a day (BID) | ORAL | Status: DC

## 2020-08-26 MED ORDER — MIDAZOLAM HCL 2 MG/2ML IJ SOLN
INTRAMUSCULAR | Status: DC | PRN
  Administered 2020-08-26: 1 mg via INTRAVENOUS

## 2020-08-26 MED ORDER — LIDOCAINE 2% (20 MG/ML) 5 ML SYRINGE
INTRAMUSCULAR | Status: AC
  Filled 2020-08-26: qty 5

## 2020-08-26 MED ORDER — PHENOL 1.4 % MT LIQD: 1.0000 | OROMUCOSAL | Status: DC | PRN

## 2020-08-26 MED ORDER — MENTHOL 3 MG MT LOZG: 1.0000 | LOZENGE | OROMUCOSAL | Status: DC | PRN

## 2020-08-26 MED ORDER — TRANEXAMIC ACID 1000 MG/10ML IV SOLN
2000.0000 mg | INTRAVENOUS | Status: DC
  Filled 2020-08-26: qty 20

## 2020-08-26 MED ORDER — CELECOXIB 200 MG PO CAPS
200.0000 mg | ORAL_CAPSULE | Freq: Once | ORAL | Status: AC
  Administered 2020-08-26: 200 mg via ORAL
  Filled 2020-08-26: qty 1

## 2020-08-26 MED ORDER — FUROSEMIDE 40 MG PO TABS: 40.0000 mg | ORAL_TABLET | Freq: Every day | ORAL | Status: DC

## 2020-08-26 MED ORDER — SODIUM CHLORIDE 0.9 % IR SOLN
Status: DC | PRN
  Administered 2020-08-26 (×2): 1000 mL

## 2020-08-26 MED ORDER — ONDANSETRON HCL 4 MG PO TABS: 4.0000 mg | ORAL_TABLET | Freq: Four times a day (QID) | ORAL | Status: DC | PRN

## 2020-08-26 MED ORDER — BUPIVACAINE LIPOSOME 1.3 % IJ SUSP: 20.0000 mL | Freq: Once | INTRAMUSCULAR | Status: DC

## 2020-08-26 MED ORDER — DEXAMETHASONE SODIUM PHOSPHATE 10 MG/ML IJ SOLN
INTRAMUSCULAR | Status: AC
  Filled 2020-08-26: qty 1

## 2020-08-26 MED ORDER — DOCUSATE SODIUM 100 MG PO CAPS
100.0000 mg | ORAL_CAPSULE | Freq: Two times a day (BID) | ORAL | Status: DC
Start: 2020-08-26 — End: 2020-08-26

## 2020-08-26 MED ORDER — CEFAZOLIN SODIUM-DEXTROSE 2-4 GM/100ML-% IV SOLN
2.0000 g | INTRAVENOUS | Status: AC
  Administered 2020-08-26: 2 g via INTRAVENOUS
  Filled 2020-08-26: qty 100

## 2020-08-26 MED ORDER — NORTRIPTYLINE HCL 10 MG PO CAPS: 20.0000 mg | ORAL_CAPSULE | Freq: Every day | ORAL | Status: DC

## 2020-08-26 MED ORDER — BISACODYL 5 MG PO TBEC: 5.0000 mg | DELAYED_RELEASE_TABLET | Freq: Every day | ORAL | Status: DC | PRN

## 2020-08-26 MED ORDER — PANTOPRAZOLE SODIUM 40 MG PO TBEC: 40.0000 mg | DELAYED_RELEASE_TABLET | Freq: Every day | ORAL | Status: DC

## 2020-08-26 MED ORDER — LACTATED RINGERS IV BOLUS
250.0000 mL | Freq: Once | INTRAVENOUS | Status: AC
  Administered 2020-08-26: 250 mL via INTRAVENOUS

## 2020-08-26 MED ORDER — METOCLOPRAMIDE HCL 5 MG/ML IJ SOLN: 5.0000 mg | Freq: Three times a day (TID) | INTRAMUSCULAR | Status: DC | PRN

## 2020-08-26 MED ORDER — METHOCARBAMOL 500 MG IVPB - SIMPLE MED
500.0000 mg | Freq: Four times a day (QID) | INTRAVENOUS | Status: DC | PRN
Start: 2020-08-26 — End: 2020-08-26

## 2020-08-26 MED ORDER — ACETAMINOPHEN 500 MG PO TABS
1000.0000 mg | ORAL_TABLET | Freq: Once | ORAL | Status: AC
  Administered 2020-08-26: 1000 mg via ORAL
  Filled 2020-08-26: qty 2

## 2020-08-26 MED ORDER — FENTANYL CITRATE (PF) 100 MCG/2ML IJ SOLN
INTRAMUSCULAR | Status: DC | PRN
  Administered 2020-08-26: 25 ug via INTRAVENOUS
  Administered 2020-08-26: 50 ug via INTRAVENOUS
  Administered 2020-08-26: 25 ug via INTRAVENOUS

## 2020-08-26 MED ORDER — PHENYLEPHRINE 40 MCG/ML (10ML) SYRINGE FOR IV PUSH (FOR BLOOD PRESSURE SUPPORT)
PREFILLED_SYRINGE | INTRAVENOUS | Status: DC | PRN
  Administered 2020-08-26: 40 ug via INTRAVENOUS

## 2020-08-26 MED ORDER — METOCLOPRAMIDE HCL 5 MG PO TABS: 5.0000 mg | ORAL_TABLET | Freq: Three times a day (TID) | ORAL | Status: DC | PRN

## 2020-08-26 MED ORDER — POVIDONE-IODINE 10 % EX SWAB: 2.0000 "application " | Freq: Once | CUTANEOUS | Status: DC

## 2020-08-26 MED ORDER — PROPOFOL 10 MG/ML IV BOLUS
INTRAVENOUS | Status: AC
  Filled 2020-08-26: qty 40

## 2020-08-26 SURGICAL SUPPLY — 52 items
ATTUNE MED DOME PAT 41 KNEE (Knees) ×2 IMPLANT
ATTUNE PS FEM LT SZ 7 CEM KNEE (Femur) ×2 IMPLANT
ATTUNE PSRP INSR SZ7 6 KNEE (Insert) ×2 IMPLANT
BAG DECANTER FOR FLEXI CONT (MISCELLANEOUS) ×4 IMPLANT
BAG SPEC THK2 15X12 ZIP CLS (MISCELLANEOUS) ×1
BAG ZIPLOCK 12X15 (MISCELLANEOUS) ×2 IMPLANT
BASE TIBIAL ROT PLAT SZ 7 KNEE (Knees) ×1 IMPLANT
BLADE SAG 18X100X1.27 (BLADE) ×2 IMPLANT
BLADE SAW SGTL 11.0X1.19X90.0M (BLADE) ×2 IMPLANT
BLADE SURG SZ10 CARB STEEL (BLADE) ×4 IMPLANT
BNDG CMPR MED 10X6 ELC LF (GAUZE/BANDAGES/DRESSINGS) ×1
BNDG ELASTIC 6X10 VLCR STRL LF (GAUZE/BANDAGES/DRESSINGS) ×2 IMPLANT
BOWL SMART MIX CTS (DISPOSABLE) ×2 IMPLANT
BSPLAT TIB 7 CMNT ROT PLAT STR (Knees) ×1 IMPLANT
CEMENT HV SMART SET (Cement) ×4 IMPLANT
COVER SURGICAL LIGHT HANDLE (MISCELLANEOUS) ×2 IMPLANT
COVER WAND RF STERILE (DRAPES) IMPLANT
CUFF TOURN SGL QUICK 34 (TOURNIQUET CUFF) ×2
CUFF TRNQT CYL 34X4.125X (TOURNIQUET CUFF) ×1 IMPLANT
DECANTER SPIKE VIAL GLASS SM (MISCELLANEOUS) IMPLANT
DRAPE ORTHO SPLIT 77X108 STRL (DRAPES)
DRAPE SURG ORHT 6 SPLT 77X108 (DRAPES) IMPLANT
DRAPE U-SHAPE 47X51 STRL (DRAPES) ×2 IMPLANT
DRSG AQUACEL AG ADV 3.5X10 (GAUZE/BANDAGES/DRESSINGS) ×2 IMPLANT
DURAPREP 26ML APPLICATOR (WOUND CARE) ×2 IMPLANT
ELECT REM PT RETURN 15FT ADLT (MISCELLANEOUS) ×2 IMPLANT
GLOVE SRG 8 PF TXTR STRL LF DI (GLOVE) ×1 IMPLANT
GLOVE SURG ENC MOIS LTX SZ7.5 (GLOVE) ×2 IMPLANT
GLOVE SURG ENC MOIS LTX SZ8.5 (GLOVE) ×2 IMPLANT
GLOVE SURG UNDER POLY LF SZ8 (GLOVE) ×2
GLOVE SURG UNDER POLY LF SZ9 (GLOVE) ×2 IMPLANT
GOWN STRL REUS W/TWL XL LVL3 (GOWN DISPOSABLE) ×4 IMPLANT
HANDPIECE INTERPULSE COAX TIP (DISPOSABLE) ×2
HOOD PEEL AWAY FLYTE STAYCOOL (MISCELLANEOUS) ×6 IMPLANT
KIT TURNOVER KIT A (KITS) ×2 IMPLANT
NEEDLE HYPO 21X1.5 SAFETY (NEEDLE) ×4 IMPLANT
NS IRRIG 1000ML POUR BTL (IV SOLUTION) ×2 IMPLANT
PACK ICE MAXI GEL EZY WRAP (MISCELLANEOUS) ×2 IMPLANT
PACK TOTAL KNEE CUSTOM (KITS) ×2 IMPLANT
PENCIL SMOKE EVACUATOR (MISCELLANEOUS) IMPLANT
PIN DRILL FIX HALF THREAD (BIT) ×2 IMPLANT
PIN STEINMAN FIXATION KNEE (PIN) ×2 IMPLANT
PROTECTOR NERVE ULNAR (MISCELLANEOUS) ×2 IMPLANT
SET HNDPC FAN SPRY TIP SCT (DISPOSABLE) ×1 IMPLANT
SUT VIC AB 1 CTX 36 (SUTURE) ×2
SUT VIC AB 1 CTX36XBRD ANBCTR (SUTURE) ×1 IMPLANT
SUT VIC AB 3-0 CT1 27 (SUTURE) ×6
SUT VIC AB 3-0 CT1 TAPERPNT 27 (SUTURE) ×3 IMPLANT
SYR CONTROL 10ML LL (SYRINGE) ×4 IMPLANT
TIBIAL BASE ROT PLAT SZ 7 KNEE (Knees) ×2 IMPLANT
TRAY FOLEY MTR SLVR 16FR STAT (SET/KITS/TRAYS/PACK) IMPLANT
WATER STERILE IRR 1000ML POUR (IV SOLUTION) ×4 IMPLANT

## 2020-08-26 NOTE — Progress Notes (Signed)
Orthopedic Tech Progress Note Patient Details:  Tanner [name] Mar 22, 1946 [id number]  Ortho Devices Type of Ortho Device: Bone foam zero knee   Post Interventions Instructions Provided: Care of device   [name] 08/26/2020, 9:52 AM

## 2020-08-26 NOTE — Transfer of Care (Signed)
Immediate Anesthesia Transfer of Care Note  Patient: Tanner [name]  Procedure(s) Performed: LEFT TOTAL KNEE ARTHROPLASTY (Left Knee)  Patient Location: PACU  Anesthesia Type:General  Level of Consciousness: awake, drowsy and patient cooperative  Airway & Oxygen Therapy: Patient Spontanous Breathing and Patient connected to face mask oxygen  Post-op Assessment: Report given to RN and Post -op Vital signs reviewed and stable  Post vital signs: Reviewed and stable  Last Vitals:  Vitals Value Taken Time  BP 137/64 08/26/20 0854  Temp    Pulse 80 08/26/20 0857  Resp 16 08/26/20 0857  SpO2 100 % 08/26/20 0857  Vitals shown include unvalidated device data.  Last Pain:  Vitals:   08/26/20 0558  TempSrc: Oral  PainSc:          Complications: No complications documented.

## 2020-08-26 NOTE — Anesthesia Postprocedure Evaluation (Signed)
Anesthesia Post Note  Patient: Tanner [name]  Procedure(s) Performed: LEFT TOTAL KNEE ARTHROPLASTY (Left Knee)     Patient location during evaluation: PACU Anesthesia Type: General Level of consciousness: sedated Pain management: pain level controlled Vital Signs Assessment: post-procedure vital signs reviewed and stable Respiratory status: spontaneous breathing and respiratory function stable Cardiovascular status: stable Postop Assessment: no apparent nausea or vomiting Anesthetic complications: no   No complications documented.  Last Vitals:  Vitals:   08/26/20 0945 08/26/20 0956  BP: 117/62 (!) 113/56  Pulse: 81 83  Resp: (!) 25 17  Temp:  36.6 C  SpO2: 94% 94%    Last Pain:  Vitals:   08/26/20 0956  TempSrc: Oral  PainSc: 0-No pain                 Jemina Scahill DANIEL

## 2020-08-26 NOTE — Op Note (Signed)
PATIENT ID:      Tanner [name]  MRN:     [id number] DOB/AGE:    24-Jul-1945 / 75 y.o.       OPERATIVE REPORT   DATE OF PROCEDURE:  08/26/2020      PREOPERATIVE DIAGNOSIS:   LEFT KNEE OSTEOARTHRITIS      Estimated body mass index is 39.66 kg/m as calculated from the following:   Height as of this encounter: 5' 5.5" (1.664 m).   Weight as of this encounter: 109.8 kg.                                                       POSTOPERATIVE DIAGNOSIS:   Same                                                                  PROCEDURE:  Procedure(s): LEFT TOTAL KNEE ARTHROPLASTY Using DepuyAttune RP implants #7L Femur, #7Tibia, 6 mm Attune RP bearing, 41 Patella    SURGEON: Kerin Salen  ASSISTANT:   Kerry Hough. [employer]   (Present and scrubbed throughout the case, critical for assistance with exposure, retraction, instrumentation, and closure.)        ANESTHESIA: GET, 20cc Exparel, 50cc 0.25% Marcaine EBL: 300 cc FLUID REPLACEMENT: 1500 cc crystaloid TOURNIQUET: DRAINS: None TRANEXAMIC ACID: 1gm IV, 2gm topical COMPLICATIONS:  None         INDICATIONS FOR PROCEDURE: The patient has  LEFT KNEE OSTEOARTHRITIS, Var deformities, XR shows bone on bone arthritis, lateral subluxation of tibia. Patient has failed all conservative measures including anti-inflammatory medicines, narcotics, attempts at exercise and weight loss, cortisone injections and viscosupplementation.  Risks and benefits of surgery have been discussed, questions answered.   DESCRIPTION OF PROCEDURE: The patient identified by armband, received  IV antibiotics, in the holding area at [facility]. Patient taken to the operating room, appropriate anesthetic monitors were attached, and GET anesthesia was  induced. IV Tranexamic acid was given.Tourniquet applied high to the operative thigh. Lateral post and foot positioner applied to the table, the lower extremity was then prepped and draped in usual sterile fashion from the toes  to the tourniquet. Time-out procedure was performed. Kerry Hough. [facility] PAC, was present and scrubbed throughout the case, critical for assistance with, positioning, exposure, retraction, instrumentation, and closure.The skin and subcutaneous tissue along the incision was injected with 20 cc of a mixture of Exparel and Marcaine solution, using a 20-gauge by 1-1/2 inch needle. We began the operation, with the knee flexed 130 degrees, by making the anterior midline incision starting at handbreadth above the patella going over the patella 1 cm medial to and 4 cm distal to the tibial tubercle. Small bleeders in the skin and the subcutaneous tissue identified and cauterized. Transverse retinaculum was incised and reflected medially and a medial parapatellar arthrotomy was accomplished. the patella was everted and theprepatellar fat pad resected. The superficial medial collateral ligament was then elevated from anterior to posterior along the proximal flare of the tibia and anterior half of the menisci resected. The knee was hyperflexed exposing bone on bone arthritis. Peripheral  and notch osteophytes as well as the cruciate ligaments were then resected. We continued to work our way around posteriorly along the proximal tibia, and externally rotated the tibia subluxing it out from underneath the femur. A McHale PCL retractor was placed through the notch and a lateral Hohmann retractor placed, and we then entered the proximal tibia in line with the Depuy starter drill in line with the axis of the tibia followed by an intramedullary guide rod and 0-degree posterior slope cutting guide. The tibial cutting guide, 4 degree posterior sloped, was pinned into place allowing resection of 4 mm of bone medially and 8 mm of bone laterally. Satisfied with the tibial resection, we then entered the distal femur 2 mm anterior to the PCL origin with the intramedullary guide rod and applied the distal femoral cutting guide set at 9 mm, with  5 degrees of valgus. This was pinned along the epicondylar axis. At this point, the distal femoral cut was accomplished without difficulty. We then sized for a #7L femoral component and pinned the guide in 0 degrees of external rotation. The chamfer cutting guide was pinned into place. The anterior, posterior, and chamfer cuts were accomplished without difficulty followed by the Attune RP box cutting guide and the box cut. We also removed posterior osteophytes from the posterior femoral condyles. The posterior capsule was injected with Exparel solution. The knee was brought into full extension. We checked our extension gap and fit a 6 mm bearing. Distracting in extension with a lamina spreader,  bleeders in the posterior capsule, Posterior medial and posterior lateral gutter were cauterized.  The transexamic acid-soaked sponge was then placed in the gap of the knee in extension. The knee was flexed 30. The posterior patella cut was accomplished with the 9.5 mm Attune cutting guide, sized for a 35mm dome, and the fixation pegs drilled.The knee was then once again hyperflexed exposing the proximal tibia. We sized for a # 7 tibial base plate, applied the smokestack and the conical reamer followed by the the Delta fin keel punch. We then hammered into place the Attune RP trial femoral component, drilled the lugs, inserted a  6 mm trial bearing, trial patellar button, and took the knee through range of motion from 0-130 degrees. Medial and lateral ligamentous stability was checked. No thumb pressure was required for patellar Tracking. The tourniquet was not used. All trial components were removed, mating surfaces irrigated with pulse lavage, and dried with suction and sponges. 10 cc of the Exparel solution was applied to the cancellus bone of the patella distal femur and proximal tibia.  After waiting 30 seconds, the bony surfaces were again, dried with sponges. A double batch of DePuy HV cement was mixed and applied to  all bony metallic mating surfaces except for the posterior condyles of the femur itself. In order, we hammered into place the tibial tray and removed excess cement, the femoral component and removed excess cement. The final Attune RP bearing was inserted, and the knee brought to full extension with compression. The patellar button was clamped into place, and excess cement removed. The knee was held at 30 flexion with compression, while the cement cured. The wound was irrigated out with normal saline solution pulse lavage. The rest of the Exparel was injected into the parapatellar arthrotomy, subcutaneous tissues, and periosteal tissues. The parapatellar arthrotomy was closed with running #1 Vicryl suture. The subcutaneous tissue with 3-0 undyed Vicryl suture, and the skin with running 3-0 SQ vicryl. An Aquacil and  Ace wrap were applied. The patient was taken to recovery room without difficulty.   Kerin Salen 08/26/2020, 6:57 AM

## 2020-08-26 NOTE — Discharge Instructions (Addendum)
INSTRUCTIONS AFTER JOINT REPLACEMENT   o Remove items at home which could result in a fall. This includes throw rugs or furniture in walking pathways o ICE to the affected joint every three hours while awake for 30 minutes at a time, for at least the first 3-5 days, and then as needed for pain and swelling.  Continue to use ice for pain and swelling. You may notice swelling that will progress down to the foot and ankle.  This is normal after surgery.  Elevate your leg when you are not up walking on it.   o Continue to use the breathing machine you got in the hospital (incentive spirometer) which will help keep your temperature down.  It is common for your temperature to cycle up and down following surgery, especially at night when you are not up moving around and exerting yourself.  The breathing machine keeps your lungs expanded and your temperature down.   DIET:  As you were doing prior to hospitalization, we recommend a well-balanced diet.  DRESSING / WOUND CARE / SHOWERING  Keep the surgical dressing until follow up.  The dressing is water proof, so you can shower without any extra covering.  IF THE DRESSING FALLS OFF or the wound gets wet inside, change the dressing with sterile gauze.  Please use good hand washing techniques before changing the dressing.  Do not use any lotions or creams on the incision until instructed by your surgeon.    ACTIVITY  o Increase activity slowly as tolerated, but follow the weight bearing instructions below.   o No driving for 6 weeks or until further direction given by your physician.  You cannot drive while taking narcotics.  o No lifting or carrying greater than 10 lbs. until further directed by your surgeon. o Avoid periods of inactivity such as sitting longer than an hour when not asleep. This helps prevent blood clots.  o You may return to work once you are authorized by your doctor.     WEIGHT BEARING   Weight bearing as tolerated with assist  device (walker, cane, etc) as directed, use it as long as suggested by your surgeon or therapist, typically at least 4-6 weeks.   EXERCISES  Results after joint replacement surgery are often greatly improved when you follow the exercise, range of motion and muscle strengthening exercises prescribed by your doctor. Safety measures are also important to protect the joint from further injury. Any time any of these exercises cause you to have increased pain or swelling, decrease what you are doing until you are comfortable again and then slowly increase them. If you have problems or questions, call your caregiver or physical therapist for advice.   Rehabilitation is important following a joint replacement. After just a few days of immobilization, the muscles of the leg can become weakened and shrink (atrophy).  These exercises are designed to build up the tone and strength of the thigh and leg muscles and to improve motion. Often times heat used for twenty to thirty minutes before working out will loosen up your tissues and help with improving the range of motion but do not use heat for the first two weeks following surgery (sometimes heat can increase post-operative swelling).   These exercises can be done on a training (exercise) mat, on the floor, on a table or on a bed. Use whatever works the best and is most comfortable for you.    Use music or television while you are exercising so that   the exercises are a pleasant break in your day. This will make your life better with the exercises acting as a break in your routine that you can look forward to.   Perform all exercises about fifteen times, three times per day or as directed.  You should exercise both the operative leg and the other leg as well.  Exercises include:   . Quad Sets - Tighten up the muscle on the front of the thigh (Quad) and hold for 5-10 seconds.   . Straight Leg Raises - With your knee straight (if you were given a brace, keep it on),  lift the leg to 60 degrees, hold for 3 seconds, and slowly lower the leg.  Perform this exercise against resistance later as your leg gets stronger.  . Leg Slides: Lying on your back, slowly slide your foot toward your buttocks, bending your knee up off the floor (only go as far as is comfortable). Then slowly slide your foot back down until your leg is flat on the floor again.  . Angel Wings: Lying on your back spread your legs to the side as far apart as you can without causing discomfort.  . Hamstring Strength:  Lying on your back, push your heel against the floor with your leg straight by tightening up the muscles of your buttocks.  Repeat, but this time bend your knee to a comfortable angle, and push your heel against the floor.  You may put a pillow under the heel to make it more comfortable if necessary.   A rehabilitation program following joint replacement surgery can speed recovery and prevent re-injury in the future due to weakened muscles. Contact your doctor or a physical therapist for more information on knee rehabilitation.    CONSTIPATION  Constipation is defined medically as fewer than three stools per week and severe constipation as less than one stool per week.  Even if you have a regular bowel pattern at home, your normal regimen is likely to be disrupted due to multiple reasons following surgery.  Combination of anesthesia, postoperative narcotics, change in appetite and fluid intake all can affect your bowels.   YOU MUST use at least one of the following options; they are listed in order of increasing strength to get the job done.  They are all available over the counter, and you may need to use some, POSSIBLY even all of these options:    Drink plenty of fluids (prune juice may be helpful) and high fiber foods Colace 100 mg by mouth twice a day  Senokot for constipation as directed and as needed Dulcolax (bisacodyl), take with full glass of water  Miralax (polyethylene glycol)  once or twice a day as needed.  If you have tried all these things and are unable to have a bowel movement in the first 3-4 days after surgery call either your surgeon or your primary doctor.    If you experience loose stools or diarrhea, hold the medications until you stool forms back up.  If your symptoms do not get better within 1 week or if they get worse, check with your doctor.  If you experience "the worst abdominal pain ever" or develop nausea or vomiting, please contact the office immediately for further recommendations for treatment.   ITCHING:  If you experience itching with your medications, try taking only a single pain pill, or even half a pain pill at a time.  You can also use Benadryl over the counter for itching or also to   help with sleep.   TED HOSE STOCKINGS:  Use stockings on both legs until for at least 2 weeks or as directed by physician office. They may be removed at night for sleeping.  MEDICATIONS:  See your medication summary on the "After Visit Summary" that nursing will review with you.  You may have some home medications which will be placed on hold until you complete the course of blood thinner medication.  It is important for you to complete the blood thinner medication as prescribed.  PRECAUTIONS:  If you experience chest pain or shortness of breath - call 911 immediately for transfer to the hospital emergency department.   If you develop a fever greater that 101 F, purulent drainage from wound, increased redness or drainage from wound, foul odor from the wound/dressing, or calf pain - CONTACT YOUR SURGEON.                                                   FOLLOW-UP APPOINTMENTS:  If you do not already have a post-op appointment, please call the office for an appointment to be seen by your surgeon.  Guidelines for how soon to be seen are listed in your "After Visit Summary", but are typically between 1-4 weeks after surgery.  OTHER INSTRUCTIONS:   Knee  Replacement:  Do not place pillow under knee, focus on keeping the knee straight while resting. CPM instructions: 0-90 degrees, 2 hours in the morning, 2 hours in the afternoon, and 2 hours in the evening. Place foam block, curve side up under heel at all times except when in CPM or when walking.  DO NOT modify, tear, cut, or change the foam block in any way.   DENTAL ANTIBIOTICS:  In most cases prophylactic antibiotics for Dental procdeures after total joint surgery are not necessary.  Exceptions are as follows:  1. History of prior total joint infection  2. Severely immunocompromised (Organ Transplant, cancer chemotherapy, Rheumatoid biologic meds such as [place])  3. Poorly controlled diabetes (A1C &gt; 8.0, blood glucose over 200)  If you have one of these conditions, contact your surgeon for an antibiotic prescription, prior to your dental procedure.   MAKE SURE YOU:  . Understand these instructions.  . Get help right away if you are not doing well or get worse.    Thank you for letting us be a part of your medical care team.  It is a privilege we respect greatly.  We hope these instructions will help you stay on track for a fast and full recovery!    General Anesthesia, Adult, Care After This sheet gives you information about how to care for yourself after your procedure. Your health care provider may also give you more specific instructions. If you have problems or questions, contact your health care provider. What can I expect after the procedure? After the procedure, the following side effects are common:  Pain or discomfort at the IV site.  Nausea.  Vomiting.  Sore throat.  Trouble concentrating.  Feeling cold or chills.  Feeling weak or tired.  Sleepiness and fatigue.  Soreness and body aches. These side effects can affect parts of the body that were not involved in surgery. Follow these instructions at home: For the time period you were told by your  health care provider:  Rest.  Do not participate in activities  where you could fall or become injured.  Do not drive or use machinery.  Do not drink alcohol.  Do not take sleeping pills or medicines that cause drowsiness.  Do not make important decisions or sign legal documents.  Do not take care of children on your own.   Eating and drinking  Follow any instructions from your health care provider about eating or drinking restrictions.  When you feel hungry, start by eating small amounts of foods that are soft and easy to digest (bland), such as toast. Gradually return to your regular diet.  Drink enough fluid to keep your urine pale yellow.  If you vomit, rehydrate by drinking water, juice, or clear broth. General instructions  If you have sleep apnea, surgery and certain medicines can increase your risk for breathing problems. Follow instructions from your health care provider about wearing your sleep device: ? Anytime you are sleeping, including during daytime naps. ? While taking prescription pain medicines, sleeping medicines, or medicines that make you drowsy.  Have a responsible adult stay with you for the time you are told. It is important to have someone help care for you until you are awake and alert.  Return to your normal activities as told by your health care provider. Ask your health care provider what activities are safe for you.  Take over-the-counter and prescription medicines only as told by your health care provider.  If you smoke, do not smoke without supervision.  Keep all follow-up visits as told by your health care provider. This is important. Contact a health care provider if:  You have nausea or vomiting that does not get better with medicine.  You cannot eat or drink without vomiting.  You have pain that does not get better with medicine.  You are unable to pass urine.  You develop a skin rash.  You have a fever.  You have redness around  your IV site that gets worse. Get help right away if:  You have difficulty breathing.  You have chest pain.  You have blood in your urine or stool, or you vomit blood. Summary  After the procedure, it is common to have a sore throat or nausea. It is also common to feel tired.  Have a responsible adult stay with you for the time you are told. It is important to have someone help care for you until you are awake and alert.  When you feel hungry, start by eating small amounts of foods that are soft and easy to digest (bland), such as toast. Gradually return to your regular diet.  Drink enough fluid to keep your urine pale yellow.  Return to your normal activities as told by your health care provider. Ask your health care provider what activities are safe for you. This information is not intended to replace advice given to you by your health care provider. Make sure you discuss any questions you have with your health care provider. Document Revised: 02/15/2020 Document Reviewed: 09/14/2019 Elsevier Patient Education  2021 [employer].

## 2020-08-26 NOTE — Interval H&P Note (Signed)
History and Physical Interval Note:  08/26/2020 6:56 AM  Tanner [name]  has presented today for surgery, with the diagnosis of LEFT KNEE OSTEOARTHRITIS.  The various methods of treatment have been discussed with the patient and family. After consideration of risks, benefits and other options for treatment, the patient has consented to  Procedure(s): LEFT TOTAL KNEE ARTHROPLASTY (Left) as a surgical intervention.  [name] history has been reviewed, patient examined, no change in status, stable for surgery.  I have reviewed the patient's chart and labs.  Questions were answered to the patient's satisfaction.     Kerin Salen

## 2020-08-26 NOTE — Evaluation (Signed)
Physical Therapy Evaluation Patient Details Name: Tanner [name] MRN: [id number] DOB: 06-23-1945 Today's Date: 08/26/2020   History of Present Illness  patient is a 75 y.o. male s/p Lt TKA on 08/26/2020 with PMH significant for pulmonary embolism, neuropathy, HTN, high cholestrol, DVT, DM, depression, anxiety, OA, acute kidney failure, and  LE edema.  Clinical Impression  Pt is a 75y.o. male s/p Lt TKA POD 0. Pt reports that he is modified independent with use of cane for mobility at baseline. Pt required MIN guard with cues for safe hand placement for sit to stand transfer. Pt required MIN assist progressing to MIN guard-supervision for safety with ambulation ~157ft with verbal cues for RW management and step to gait pattern with no LOB. Pt safely performed stair negotiation with MIN assist-MIN guard for safety and cues for sequencing. Pt's son was able to demonstrate safe guarding position during mobility with cues from therapist. PT reviewed therapeutic interventions for promotion of DVT prevention, pt demonstrated understanding. Pt will have assistance from his sons upon discharge. Pt is currently at a safe mobility level for discharge home. Pt will benefit from skilled PT to increase independence and safety with mobility.    Follow Up Recommendations Home health PT;Follow surgeon's recommendation for DC plan and follow-up therapies    Equipment Recommendations  None recommended by PT (pt owns RW)    Recommendations for Other Services       Precautions / Restrictions Precautions Precautions: Fall Restrictions Weight Bearing Restrictions: No Other Position/Activity Restrictions: WBAT      Mobility  Bed Mobility Overal bed mobility: Needs Assistance Bed Mobility: Supine to Sit     Supine to sit: Supervision;HOB elevated     General bed mobility comments: use of bed rail and B UEs to scoot to EOB    Transfers Overall transfer level: Needs assistance Equipment used: Rolling  walker (2 wheeled) Transfers: Sit to/from Stand Sit to Stand: Min guard         General transfer comment: x2 from EOB and recliner; MIN guard for safety with cues for safe hand placement  Ambulation/Gait Ambulation/Gait assistance: Min assist;Min guard;Supervision Gait Distance (Feet): 150 Feet (70, 80) Assistive device: Rolling walker (2 wheeled) Gait Pattern/deviations: Step-to pattern;Decreased stride length;Decreased weight shift to left Gait velocity: decr   General Gait Details: Pt perfomed pre gait marching with use of B UEs on RW with no knee buckling noted. MIN assist progressing to MIN guard-supervision for safety with cues for RW management and step to gait pattern with no LOB. Pt's son demonstrated safe guarding position with cues from therapist.  Stairs Stairs: Yes Stairs assistance: Min assist;Min guard Stair Management: One rail Right;Forwards;With cane Number of Stairs: 3 General stair comments: MIN assist-MIN guard for safety with cues for sequencing "up with the good, down with the bad." Pt's son demonstrated safe guarding position with cues from therapist.  Wheelchair Mobility    Modified Rankin (Stroke Patients Only)       Balance Overall balance assessment: Needs assistance Sitting-balance support: Feet supported Sitting balance-Leahy Scale: Good     Standing balance support: Bilateral upper extremity supported;During functional activity;No upper extremity supported Standing balance-Leahy Scale: Fair Standing balance comment: pt was able to perform hand hygiene without UE support on RW and supervision from therapist for safety with no LOB.                             Pertinent Vitals/Pain Pain Assessment:  No/denies pain    Home Living Family/patient expects to be discharged to:: Private residence Living Arrangements: Alone Available Help at Discharge: Family Type of Home: House Home Access: Stairs to enter Entrance Stairs-Rails:  Right Entrance Stairs-Number of Steps: 4 Home Layout: One level Home Equipment: [place] - 2 wheels;Cane - single point;Shower seat;Grab bars - tub/shower Additional Comments: pt will have asssit from his sons at home    Prior Function Level of Independence: Independent with assistive device(s)         Comments: use of cane     Hand Dominance   Dominant Hand: Right    Extremity/Trunk Assessment   Upper Extremity Assessment Upper Extremity Assessment: Overall WFL for tasks assessed    Lower Extremity Assessment Lower Extremity Assessment: LLE deficits/detail LLE Deficits / Details: pt with good Lt quad set strength and 4+/5 B dorsi/plantar flexin strength. Pt able to complete full SLR with no extensor lag. LLE Sensation: WNL LLE Coordination: WNL    Cervical / Trunk Assessment Cervical / Trunk Assessment: Normal  Communication   Communication: No difficulties  Cognition Arousal/Alertness: Awake/alert Behavior During Therapy: WFL for tasks assessed/performed Overall Cognitive Status: Within Functional Limits for tasks assessed                                        General Comments      Exercises Total Joint Exercises Ankle Circles/Pumps: AROM;Both;15 reps;Seated Quad Sets: AROM;Left;5 reps;Seated Short Arc Quad: AROM;Left;5 reps;Seated Heel Slides: AROM;Left;5 reps;Seated Hip ABduction/ADduction: AROM;Left;5 reps;Seated Straight Leg Raises: AROM;Left;5 reps;Seated Long Arc Quad: AROM;Left;5 reps;Seated   Assessment/Plan    PT Assessment Patient needs continued PT services  PT Problem List Decreased strength;Decreased range of motion;Decreased activity tolerance;Decreased balance;Decreased mobility;Decreased knowledge of use of DME;Pain       PT Treatment Interventions DME instruction;Gait training;Stair training;Functional mobility training;Therapeutic activities;Therapeutic exercise;Balance training;Patient/family education    PT Goals  (Current goals can be found in the Care Plan section)  Acute Rehab PT Goals Patient Stated Goal: get back to helping with activites at church PT Goal Formulation: With patient/family Time For Goal Achievement: 09/02/20 Potential to Achieve Goals: Good    Frequency 7X/week   Barriers to discharge        Co-evaluation               AM-PAC PT "6 Clicks" Mobility  Outcome Measure Help needed turning from your back to your side while in a flat bed without using bedrails?: None Help needed moving from lying on your back to sitting on the side of a flat bed without using bedrails?: None Help needed moving to and from a bed to a chair (including a wheelchair)?: A Little Help needed standing up from a chair using your arms (e.g., wheelchair or bedside chair)?: A Little Help needed to walk in hospital room?: A Little Help needed climbing 3-5 steps with a railing? : A Little 6 Click Score: 20    End of Session Equipment Utilized During Treatment: Gait belt Activity Tolerance: Patient tolerated treatment well Patient left: in chair;with call bell/phone within reach;with family/visitor present Nurse Communication: Mobility status PT Visit Diagnosis: Unsteadiness on feet (R26.81);Muscle weakness (generalized) (M62.81);Pain Pain - Right/Left: Left Pain - part of body: Knee    Time: 9518-8416 PT Time Calculation (min) (ACUTE ONLY): 35 min   Charges:             Ander Purpura  Youngblood, SPT  Acute rehab    Lauren Youngblood 08/26/2020, 1:29 PM

## 2020-08-26 NOTE — Anesthesia Procedure Notes (Signed)
Procedure Name: LMA Insertion Date/Time: 08/26/2020 7:22 AM Performed by: Eben Burow, CRNA Pre-anesthesia Checklist: Patient identified, Emergency Drugs available, Suction available, Patient being monitored and Timeout performed Patient Re-evaluated:Patient Re-evaluated prior to induction Oxygen Delivery Method: Circle system utilized Preoxygenation: Pre-oxygenation with 100% oxygen Induction Type: IV induction Ventilation: Mask ventilation without difficulty LMA: LMA inserted LMA Size: 4.0 Number of attempts: 1 Placement Confirmation: positive ETCO2 Tube secured with: Tape Dental Injury: Teeth and Oropharynx as per pre-operative assessment

## 2020-08-26 NOTE — Anesthesia Procedure Notes (Signed)
Anesthesia Regional Block: Adductor canal block   Pre-Anesthetic Checklist: ,, timeout performed, Correct Patient, Correct Site, Correct Laterality, Correct Procedure, Correct Position, site marked, Risks and benefits discussed,  Surgical consent,  Pre-op evaluation,  At surgeon's request and post-op pain management  Laterality: Left  Prep: chloraprep       Needles:  Injection technique: Single-shot  Needle Type: Stimulator Needle - 80     Needle Length: 10cm  Needle Gauge: 21     Additional Needles:   Narrative:  Start time: 08/26/2020 6:54 AM End time: 08/26/2020 7:04 AM Injection made incrementally with aspirations every 5 mL.  Performed by: Personally

## 2020-08-27 ENCOUNTER — Ambulatory Visit (INDEPENDENT_AMBULATORY_CARE_PROVIDER_SITE_OTHER): Payer: Medicare HMO | Admitting: Family Medicine

## 2020-08-27 ENCOUNTER — Encounter (HOSPITAL_COMMUNITY): Payer: Self-pay | Admitting: Orthopedic Surgery

## 2020-08-27 ENCOUNTER — Telehealth: Payer: Self-pay | Admitting: Pharmacist

## 2020-08-27 DIAGNOSIS — E1159 Type 2 diabetes mellitus with other circulatory complications: Secondary | ICD-10-CM | POA: Diagnosis not present

## 2020-08-27 DIAGNOSIS — J309 Allergic rhinitis, unspecified: Secondary | ICD-10-CM | POA: Diagnosis not present

## 2020-08-27 DIAGNOSIS — E1122 Type 2 diabetes mellitus with diabetic chronic kidney disease: Secondary | ICD-10-CM | POA: Diagnosis not present

## 2020-08-27 DIAGNOSIS — I152 Hypertension secondary to endocrine disorders: Secondary | ICD-10-CM | POA: Diagnosis not present

## 2020-08-27 DIAGNOSIS — G709 Myoneural disorder, unspecified: Secondary | ICD-10-CM | POA: Diagnosis not present

## 2020-08-27 DIAGNOSIS — I700 Atherosclerosis of aorta: Secondary | ICD-10-CM | POA: Diagnosis not present

## 2020-08-27 DIAGNOSIS — N183 Chronic kidney disease, stage 3 unspecified: Secondary | ICD-10-CM | POA: Diagnosis not present

## 2020-08-27 DIAGNOSIS — E1142 Type 2 diabetes mellitus with diabetic polyneuropathy: Secondary | ICD-10-CM | POA: Diagnosis not present

## 2020-08-27 DIAGNOSIS — G5600 Carpal tunnel syndrome, unspecified upper limb: Secondary | ICD-10-CM | POA: Diagnosis not present

## 2020-08-27 DIAGNOSIS — M48061 Spinal stenosis, lumbar region without neurogenic claudication: Secondary | ICD-10-CM | POA: Diagnosis not present

## 2020-08-27 DIAGNOSIS — G510 Bell's palsy: Secondary | ICD-10-CM | POA: Diagnosis not present

## 2020-08-27 DIAGNOSIS — Z471 Aftercare following joint replacement surgery: Secondary | ICD-10-CM | POA: Diagnosis not present

## 2020-08-27 NOTE — Progress Notes (Addendum)
Chronic Care Management Pharmacy Assistant   Name: LAYTHAN HAYTER  MRN: [id number] DOB: Jun 28, 1945   Reason for Encounter: Hypertension Disease State Call   Conditions to be addressed/monitored: HTN  Recent office visits:  NA  Recent consult visits:  [facility] visits:  None in previous 6 months  Medications: Outpatient Encounter Medications as of 08/27/2020  Medication Sig   acetaminophen (TYLENOL) 500 MG tablet Take 1,000 mg by mouth every 6 (six) hours as needed for moderate pain.   acidophilus (RISAQUAD) CAPS capsule Take 1 capsule by mouth daily.   apixaban (ELIQUIS) 2.5 MG TABS tablet Take 1 tablet (2.5 mg total) by mouth 2 (two) times daily.   atenolol (TENORMIN) 25 MG tablet Take 12.5 mg by mouth daily.   atorvastatin (LIPITOR) 80 MG tablet Take 40 mg by mouth daily.   buPROPion (WELLBUTRIN SR) 150 MG 12 hr tablet Take 150 mg by mouth 2 (two) times daily.   finasteride (PROSCAR) 5 MG tablet Take 5 mg by mouth at bedtime.   furosemide (LASIX) 20 MG tablet Take 2 tablets (40 mg total) by mouth 2 (two) times daily. (Patient taking differently: Take 40 mg by mouth daily.)   gabapentin (NEURONTIN) 300 MG capsule Take 300-600 mg by mouth See admin instructions. Take 600 mg by mouth three times daily and  300 mg at night   ketoconazole (NIZORAL) 2 % shampoo Apply 1 application topically every other day.   Menthol-Methyl Salicylate (THERA-GESIC PLUS) CREA Apply 1 application topically at bedtime. Apply to lower back   Multiple Vitamins-Minerals (MULTIVITAMINS THER. W/MINERALS) TABS Take 1 tablet by mouth daily.   neomycin-polymyxin-hydrocortisone (CORTISPORIN) 3.5-10000-1 OTIC suspension Place 2 drops into both ears daily. (Patient not taking: Reported on 08/09/2020)   nortriptyline (PAMELOR) 10 MG capsule 2 CAPSULES EVERY NIGHT AT BEDTIME (Patient taking differently: Take 20 mg by mouth at bedtime.)   ondansetron (ZOFRAN) 4 MG tablet Take 1 tablet (4 mg total) by mouth daily  as needed for nausea or vomiting.   oxyCODONE-acetaminophen (PERCOCET/ROXICET) 5-325 MG tablet Take 1 tablet by mouth every 4 (four) hours as needed for severe pain.   potassium chloride (KLOR-CON) 10 MEQ tablet Take 4 tablets (40 mEq total) by mouth 2 (two) times daily. (Patient taking differently: Take 20 mEq by mouth daily.)   saccharomyces boulardii (FLORASTOR) 250 MG capsule Take 1 capsule (250 mg total) by mouth 2 (two) times daily. (Patient not taking: Reported on 08/09/2020)   Semaglutide,0.25 or 0.5MG /DOS, (OZEMPIC, 0.25 OR 0.5 MG/DOSE,) 2 MG/1.5ML SOPN Inject 0.5 mg into the skin once a week. (Patient taking differently: Inject 0.5 mg into the skin once a week. PT TAKES ON Wednesday)   tiZANidine (ZANAFLEX) 2 MG tablet Take 1 tablet (2 mg total) by mouth every 6 (six) hours as needed for muscle spasms.   No facility-administered encounter medications on file as of 08/27/2020.    Reviewed chart prior to disease state call. Spoke with patient regarding BP  Recent Office Vitals: BP Readings from Last 3 Encounters:  08/26/20 132/80  08/20/20 (!) 124/58  07/24/20 127/70   Pulse Readings from Last 3 Encounters:  08/26/20 86  08/20/20 70  07/24/20 65    Wt Readings from Last 3 Encounters:  08/26/20 242 lb (109.8 kg)  07/24/20 245 lb (111.1 kg)  07/23/20 253 lb (114.8 kg)     Kidney Function Lab Results  Component Value Date/Time   CREATININE 1.48 (H) 08/20/2020 01:26 PM   CREATININE 1.40 (H) 07/04/2020  11:35 AM   CREATININE 1.43 (H) 01/23/2020 04:03 PM   CREATININE 1.70 (H) 01/02/2020 11:27 AM   CREATININE 1.2 12/29/2013 01:56 PM   CREATININE 1.2 05/18/2013 09:49 AM   GFR 48.19 (L) 12/12/2019 02:43 PM   GFRNONAA 49 (L) 08/20/2020 01:26 PM   GFRNONAA 52 (L) 07/04/2020 11:35 AM   GFRAA 55 (L) 01/23/2020 04:03 PM   GFRAA 45 (L) 01/02/2020 11:27 AM    BMP Latest Ref Rng & Units 08/20/2020 07/04/2020 04/15/2020  Glucose 70 - 99 mg/dL 89 92 -  BUN 8 - 23 mg/dL 19 19 -   Creatinine 0.61 - 1.24 mg/dL 1.48(H) 1.40(H) -  BUN/Creat Ratio 10 - 24 - - -  Sodium 135 - 145 mmol/L 140 140 -  Potassium 3.5 - 5.1 mmol/L 3.8 4.1 -  Chloride 98 - 111 mmol/L 102 102 106  CO2 22 - 32 mmol/L 29 32 31(A)  Calcium 8.9 - 10.3 mg/dL 9.7 9.7 8.9    Current antihypertensive regimen:The patient stakes atenolol and furosemide for blood pressure   How often are you checking your Blood Pressure? The patient states that he does check blood pressure occasionally   Current home BP readings: The patient blood pressure reading today was 120/60  What recent interventions/DTPs have been made by any provider to improve Blood Pressure control since last CPP Visit: None  Any recent hospitalizations or ED visits since last visit with CPP? The patient was in the hospital for knee surgery on 08/26/20  What diet changes have been made to improve Blood Pressure Control? The patient stated that he was given a special diet when he left the hospital   What exercise is being done to improve your Blood Pressure Control? The patient cannot exercise at this time since he just had knee surgery   Adherence Review: Is the patient currently on ACE/ARB medication? No Does the patient have >5 day gap between last estimated fill dates? no  Wendy Poet, [employer] [phone]

## 2020-08-29 ENCOUNTER — Telehealth: Payer: Self-pay | Admitting: *Deleted

## 2020-08-29 NOTE — Telephone Encounter (Signed)
[id number]/spoke with [facility] doing well,does have some pain but is handling this and doing physical therapy.  Son is staying with him and helping.  Seems to be in good spirits and does not need anything at this point.  My number given to him if he needs anything.  Tanner [name],BSN,RN3,CCM.CN

## 2020-08-30 DIAGNOSIS — M48061 Spinal stenosis, lumbar region without neurogenic claudication: Secondary | ICD-10-CM | POA: Diagnosis not present

## 2020-08-30 DIAGNOSIS — I700 Atherosclerosis of aorta: Secondary | ICD-10-CM | POA: Diagnosis not present

## 2020-08-30 DIAGNOSIS — G5600 Carpal tunnel syndrome, unspecified upper limb: Secondary | ICD-10-CM | POA: Diagnosis not present

## 2020-08-30 DIAGNOSIS — G510 Bell's palsy: Secondary | ICD-10-CM | POA: Diagnosis not present

## 2020-08-30 DIAGNOSIS — E1122 Type 2 diabetes mellitus with diabetic chronic kidney disease: Secondary | ICD-10-CM | POA: Diagnosis not present

## 2020-08-30 DIAGNOSIS — G709 Myoneural disorder, unspecified: Secondary | ICD-10-CM | POA: Diagnosis not present

## 2020-08-30 DIAGNOSIS — Z471 Aftercare following joint replacement surgery: Secondary | ICD-10-CM | POA: Diagnosis not present

## 2020-08-30 DIAGNOSIS — E1159 Type 2 diabetes mellitus with other circulatory complications: Secondary | ICD-10-CM | POA: Diagnosis not present

## 2020-08-30 DIAGNOSIS — I152 Hypertension secondary to endocrine disorders: Secondary | ICD-10-CM | POA: Diagnosis not present

## 2020-08-30 DIAGNOSIS — N183 Chronic kidney disease, stage 3 unspecified: Secondary | ICD-10-CM | POA: Diagnosis not present

## 2020-08-30 DIAGNOSIS — J309 Allergic rhinitis, unspecified: Secondary | ICD-10-CM | POA: Diagnosis not present

## 2020-08-30 DIAGNOSIS — E1142 Type 2 diabetes mellitus with diabetic polyneuropathy: Secondary | ICD-10-CM | POA: Diagnosis not present

## 2020-08-31 DIAGNOSIS — E1159 Type 2 diabetes mellitus with other circulatory complications: Secondary | ICD-10-CM | POA: Diagnosis not present

## 2020-08-31 DIAGNOSIS — E1122 Type 2 diabetes mellitus with diabetic chronic kidney disease: Secondary | ICD-10-CM | POA: Diagnosis not present

## 2020-08-31 DIAGNOSIS — E1142 Type 2 diabetes mellitus with diabetic polyneuropathy: Secondary | ICD-10-CM | POA: Diagnosis not present

## 2020-08-31 DIAGNOSIS — G709 Myoneural disorder, unspecified: Secondary | ICD-10-CM | POA: Diagnosis not present

## 2020-08-31 DIAGNOSIS — I152 Hypertension secondary to endocrine disorders: Secondary | ICD-10-CM | POA: Diagnosis not present

## 2020-08-31 DIAGNOSIS — G5600 Carpal tunnel syndrome, unspecified upper limb: Secondary | ICD-10-CM | POA: Diagnosis not present

## 2020-08-31 DIAGNOSIS — I700 Atherosclerosis of aorta: Secondary | ICD-10-CM | POA: Diagnosis not present

## 2020-08-31 DIAGNOSIS — M48061 Spinal stenosis, lumbar region without neurogenic claudication: Secondary | ICD-10-CM | POA: Diagnosis not present

## 2020-08-31 DIAGNOSIS — J309 Allergic rhinitis, unspecified: Secondary | ICD-10-CM | POA: Diagnosis not present

## 2020-08-31 DIAGNOSIS — N183 Chronic kidney disease, stage 3 unspecified: Secondary | ICD-10-CM | POA: Diagnosis not present

## 2020-08-31 DIAGNOSIS — Z471 Aftercare following joint replacement surgery: Secondary | ICD-10-CM | POA: Diagnosis not present

## 2020-08-31 DIAGNOSIS — G510 Bell's palsy: Secondary | ICD-10-CM | POA: Diagnosis not present

## 2020-09-02 DIAGNOSIS — G5600 Carpal tunnel syndrome, unspecified upper limb: Secondary | ICD-10-CM | POA: Diagnosis not present

## 2020-09-02 DIAGNOSIS — E1142 Type 2 diabetes mellitus with diabetic polyneuropathy: Secondary | ICD-10-CM | POA: Diagnosis not present

## 2020-09-02 DIAGNOSIS — M48061 Spinal stenosis, lumbar region without neurogenic claudication: Secondary | ICD-10-CM | POA: Diagnosis not present

## 2020-09-02 DIAGNOSIS — Z471 Aftercare following joint replacement surgery: Secondary | ICD-10-CM | POA: Diagnosis not present

## 2020-09-02 DIAGNOSIS — E1122 Type 2 diabetes mellitus with diabetic chronic kidney disease: Secondary | ICD-10-CM | POA: Diagnosis not present

## 2020-09-02 DIAGNOSIS — G510 Bell's palsy: Secondary | ICD-10-CM | POA: Diagnosis not present

## 2020-09-02 DIAGNOSIS — J309 Allergic rhinitis, unspecified: Secondary | ICD-10-CM | POA: Diagnosis not present

## 2020-09-02 DIAGNOSIS — I152 Hypertension secondary to endocrine disorders: Secondary | ICD-10-CM | POA: Diagnosis not present

## 2020-09-02 DIAGNOSIS — I700 Atherosclerosis of aorta: Secondary | ICD-10-CM | POA: Diagnosis not present

## 2020-09-02 DIAGNOSIS — E1159 Type 2 diabetes mellitus with other circulatory complications: Secondary | ICD-10-CM | POA: Diagnosis not present

## 2020-09-02 DIAGNOSIS — G709 Myoneural disorder, unspecified: Secondary | ICD-10-CM | POA: Diagnosis not present

## 2020-09-02 DIAGNOSIS — N183 Chronic kidney disease, stage 3 unspecified: Secondary | ICD-10-CM | POA: Diagnosis not present

## 2020-09-03 NOTE — Progress Notes (Deleted)
Tanner [name] [place] Phone: [phone] Subjective:    I'm seeing this patient by the request  of:  Tanner Koch, MD  CC:   [id number]   07/23/2020 Severe arthritic changes of the knee.  Patient is going to follow-up with an orthopedic surgeon in 1 week.  I do believe that patient is now lost enough weight but hopefully can have surgical intervention and should do relatively well.  Patient's BMI is a little elevated but I do think he will do well and follow-up with me again only as needed.  Update 09/04/2020 Tanner [name] is a 75 y.o. male coming in with complaint of L knee pain. Patient underwent L TKR on 08/26/2020 by Dr. Mayer Camel. Patient sttes   Onset-  Location Duration-  Character- Aggravating factors- Reliving factors-  Therapies tried-  Severity-     Past Medical History:  Diagnosis Date  . Acute kidney failure ([place])   . Anxiety   . Arthritis   . Back pain   . BPH (benign prostatic hypertrophy)   . Clostridium difficile infection   . Clotting disorder ([place])   . Depression   . Diabetes ([place]) 12/11/2016   type 2   . DVT (deep venous thrombosis) ([place])   . DVT of deep femoral vein, right ([place]) 06/29/2018  . Edema, lower extremity   . High cholesterol   . Hypertension   . Joint pain   . Low back pain potentially associated with spinal stenosis   . Neuromuscular disorder ([place])   . Neuropathy of lower extremity    bilateral  . OSA (obstructive sleep apnea)    pt denies   . Osteoarthritis   . PE (pulmonary embolism)   . Pneumonia    hx of x 2   . Ventral hernia    Past Surgical History:  Procedure Laterality Date  . EYE SURGERY    . HERNIA REPAIR  1999  . TOTAL KNEE ARTHROPLASTY Left 08/26/2020   Procedure: LEFT TOTAL KNEE ARTHROPLASTY;  Surgeon: Frederik Pear, MD;  Location: [facility];  Service: Orthopedics;  Laterality: Left;   Social History   Socioeconomic History  . Marital status:  Widowed    Spouse name: Not on file  . Number of children: 2  . Years of education: Not on file  . Highest education level: Not on file  Occupational History  . Occupation: Retired  Tobacco Use  . Smoking status: Former Smoker    Packs/day: 2.00    Years: 33.00    Pack years: 66.00    Types: Cigarettes    Start date: 08/30/1968    Quit date: 07/02/2001    Years since quitting: 19.1  . Smokeless tobacco: Never Used  . Tobacco comment: quit 12 years ago  Vaping Use  . Vaping Use: Never used  Substance and Sexual Activity  . Alcohol use: Yes    Comment: seldom   . Drug use: No  . Sexual activity: Not Currently  Other Topics Concern  . 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    Social Determinants of Health   Financial Resource Strain: Low Risk   . Difficulty of Paying Living Expenses: Not very hard  Food Insecurity: Not on file  Transportation Needs: Not on file  Physical Activity: Not on file  Stress: Not on file  Social Connections: Not on file   Allergies  Allergen Reactions  . Lisinopril     Other reaction(s): Renal impairment  . Amlodipine Swelling  . Codeine Sulfate Itching and Nausea Only   Family History  Problem Relation Age of Onset  . Diabetes Mother   . Pneumonia Mother   . Alzheimer's disease Mother   . Hyperlipidemia Mother   . Thyroid disease Mother   . Depression Mother   . Other Father 5       Drowned on boating accident  . Colon cancer Neg Hx   . Colon polyps Neg Hx     Current Outpatient Medications (Endocrine & Metabolic):  .  Semaglutide,0.25 or 0.5MG /DOS, (OZEMPIC, 0.25 OR 0.5 MG/DOSE,) 2 MG/1.5ML SOPN, Inject 0.5 mg into the skin once a week. (Patient taking differently: Inject 0.5 mg into the skin once a week. PT TAKES ON Wednesday)  Current Outpatient Medications  (Cardiovascular):  .  atenolol (TENORMIN) 25 MG tablet, Take 12.5 mg by mouth daily. Marland Kitchen  atorvastatin (LIPITOR) 80 MG tablet, Take 40 mg by mouth daily. .  furosemide (LASIX) 20 MG tablet, Take 2 tablets (40 mg total) by mouth 2 (two) times daily. (Patient taking differently: Take 40 mg by mouth daily.)   Current Outpatient Medications (Analgesics):  .  acetaminophen (TYLENOL) 500 MG tablet, Take 1,000 mg by mouth every 6 (six) hours as needed for moderate pain. Marland Kitchen  oxyCODONE-acetaminophen (PERCOCET/ROXICET) 5-325 MG tablet, Take 1 tablet by mouth every 4 (four) hours as needed for severe pain.  Current Outpatient Medications (Hematological):  .  apixaban (ELIQUIS) 2.5 MG TABS tablet, Take 1 tablet (2.5 mg total) by mouth 2 (two) times daily.  Current Outpatient Medications (Other):  .  acidophilus (RISAQUAD) CAPS capsule, Take 1 capsule by mouth daily. Marland Kitchen  buPROPion (WELLBUTRIN SR) 150 MG 12 hr tablet, Take 150 mg by mouth 2 (two) times daily. .  finasteride (PROSCAR) 5 MG tablet, Take 5 mg by mouth at bedtime. .  gabapentin (NEURONTIN) 300 MG capsule, Take 300-600 mg by mouth See admin instructions. Take 600 mg by mouth three times daily and  300 mg at night .  ketoconazole (NIZORAL) 2 % shampoo, Apply 1 application topically every other day. .  Menthol-Methyl Salicylate (THERA-GESIC PLUS) CREA, Apply 1 application topically at bedtime. Apply to lower back .  Multiple Vitamins-Minerals (MULTIVITAMINS THER. W/MINERALS) TABS, Take 1 tablet by mouth daily. Marland Kitchen  neomycin-polymyxin-hydrocortisone (CORTISPORIN) 3.5-10000-1 OTIC suspension, Place 2 drops into both ears daily. (Patient not taking: Reported on 08/09/2020) .  nortriptyline (PAMELOR) 10 MG capsule, 2 CAPSULES EVERY NIGHT AT BEDTIME (Patient taking differently: Take 20 mg by mouth at bedtime.) .  ondansetron (ZOFRAN) 4 MG tablet, Take 1 tablet (4 mg total) by mouth daily as needed for nausea or vomiting. .  potassium chloride (KLOR-CON) 10  MEQ tablet, Take 4 tablets (40 mEq total) by mouth 2 (two) times daily. (Patient taking differently: Take 20 mEq by mouth daily.) .  saccharomyces boulardii (FLORASTOR) 250 MG capsule, Take 1 capsule (250 mg total) by mouth 2 (two) times daily. (Patient not taking: Reported on 08/09/2020) .  tiZANidine (ZANAFLEX) 2 MG tablet, Take 1 tablet (2 mg total) by mouth every 6 (six) hours as needed for muscle spasms.   Reviewed prior external information including notes and imaging from  primary care provider As well as notes that were available from care  everywhere and other healthcare systems.  Past medical history, social, surgical and family history all reviewed in electronic medical record.  No pertanent information unless stated regarding to the chief complaint.   Review of Systems:  No headache, visual changes, nausea, vomiting, diarrhea, constipation, dizziness, abdominal pain, skin rash, fevers, chills, night sweats, weight loss, swollen lymph nodes, body aches, joint swelling, chest pain, shortness of breath, mood changes. POSITIVE muscle aches  Objective  There were no vitals taken for this visit.   General: No apparent distress alert and oriented x3 mood and affect normal, dressed appropriately.  HEENT: Pupils equal, extraocular movements intact  Respiratory: Patient's speak in full sentences and does not appear short of breath  Cardiovascular: No lower extremity edema, non tender, no erythema  Gait normal with good balance and coordination.  MSK:  Non tender with full range of motion and good stability and symmetric strength and tone of shoulders, elbows, wrist, hip, knee and ankles bilaterally.     Impression and Recommendations:     The above documentation has been reviewed and is accurate and complete Jacqualin Combes

## 2020-09-04 ENCOUNTER — Ambulatory Visit: Payer: Medicare HMO | Admitting: Family Medicine

## 2020-09-04 DIAGNOSIS — G510 Bell's palsy: Secondary | ICD-10-CM | POA: Diagnosis not present

## 2020-09-04 DIAGNOSIS — M48061 Spinal stenosis, lumbar region without neurogenic claudication: Secondary | ICD-10-CM | POA: Diagnosis not present

## 2020-09-04 DIAGNOSIS — E1122 Type 2 diabetes mellitus with diabetic chronic kidney disease: Secondary | ICD-10-CM | POA: Diagnosis not present

## 2020-09-04 DIAGNOSIS — J309 Allergic rhinitis, unspecified: Secondary | ICD-10-CM | POA: Diagnosis not present

## 2020-09-04 DIAGNOSIS — E1142 Type 2 diabetes mellitus with diabetic polyneuropathy: Secondary | ICD-10-CM | POA: Diagnosis not present

## 2020-09-04 DIAGNOSIS — G709 Myoneural disorder, unspecified: Secondary | ICD-10-CM | POA: Diagnosis not present

## 2020-09-04 DIAGNOSIS — N183 Chronic kidney disease, stage 3 unspecified: Secondary | ICD-10-CM | POA: Diagnosis not present

## 2020-09-04 DIAGNOSIS — I152 Hypertension secondary to endocrine disorders: Secondary | ICD-10-CM | POA: Diagnosis not present

## 2020-09-04 DIAGNOSIS — Z471 Aftercare following joint replacement surgery: Secondary | ICD-10-CM | POA: Diagnosis not present

## 2020-09-04 DIAGNOSIS — E1159 Type 2 diabetes mellitus with other circulatory complications: Secondary | ICD-10-CM | POA: Diagnosis not present

## 2020-09-04 DIAGNOSIS — I700 Atherosclerosis of aorta: Secondary | ICD-10-CM | POA: Diagnosis not present

## 2020-09-04 DIAGNOSIS — G5600 Carpal tunnel syndrome, unspecified upper limb: Secondary | ICD-10-CM | POA: Diagnosis not present

## 2020-09-05 DIAGNOSIS — Z96652 Presence of left artificial knee joint: Secondary | ICD-10-CM | POA: Diagnosis not present

## 2020-09-05 DIAGNOSIS — Z471 Aftercare following joint replacement surgery: Secondary | ICD-10-CM | POA: Diagnosis not present

## 2020-09-06 ENCOUNTER — Ambulatory Visit: Payer: Medicare HMO | Attending: Orthopedic Surgery | Admitting: Physical Therapy

## 2020-09-06 ENCOUNTER — Other Ambulatory Visit: Payer: Self-pay

## 2020-09-06 ENCOUNTER — Encounter: Payer: Self-pay | Admitting: Physical Therapy

## 2020-09-06 ENCOUNTER — Ambulatory Visit: Payer: Medicare HMO | Admitting: Physical Therapy

## 2020-09-06 DIAGNOSIS — M6281 Muscle weakness (generalized): Secondary | ICD-10-CM | POA: Diagnosis present

## 2020-09-06 DIAGNOSIS — M25562 Pain in left knee: Secondary | ICD-10-CM | POA: Insufficient documentation

## 2020-09-06 DIAGNOSIS — R600 Localized edema: Secondary | ICD-10-CM

## 2020-09-06 DIAGNOSIS — G8929 Other chronic pain: Secondary | ICD-10-CM | POA: Insufficient documentation

## 2020-09-06 NOTE — Patient Instructions (Signed)
Access Code: [email or web address] URL: [email or web address] Date: 09/06/2020 Prepared by: Venetia Night Yemariam Magar  Exercises Supine Heel Slide - 3 x daily - 7 x weekly - 2 sets - 10 reps Straight Leg Raise - 1 x daily - 7 x weekly - 3 sets - 5 reps Supine Quadricep Sets - 3 x daily - 7 x weekly - 2 sets - 10 reps - 5 hold Seated Long Arc Quad - 3 x daily - 7 x weekly - 2 sets - 10 reps Heel rises with counter support - 3 x daily - 7 x weekly - 1 sets - 10 reps

## 2020-09-06 NOTE — Therapy (Signed)
[facility] [address] [place], [place], [address] Phone: [phone]   Fax:  [phone]  Physical Therapy Evaluation  Patient Details  Name: Tanner [name] MRN: [id number] Date of Birth: 02/06/1946 Referring Provider (PT): Kathalene Frames. Mayer Camel, MD   Encounter Date: 09/06/2020   PT End of Session - 09/06/20 1205    Visit Number 1    Date for PT Re-Evaluation 11/29/20    Authorization Type [employer] Medicare    Progress Note Due on Visit 20    PT Start Time 1100    PT Stop Time 1200    PT Time Calculation (min) 60 min    Activity Tolerance Patient tolerated treatment well;Patient limited by fatigue    Behavior During Therapy [facility] for tasks assessed/performed           Past Medical History:  Diagnosis Date  . Acute kidney failure ([place])   . Anxiety   . Arthritis   . Back pain   . BPH (benign prostatic hypertrophy)   . Clostridium difficile infection   . Clotting disorder ([place])   . Depression   . Diabetes ([place]) 12/11/2016   type 2   . DVT (deep venous thrombosis) ([place])   . DVT of deep femoral vein, right ([place]) 06/29/2018  . Edema, lower extremity   . High cholesterol   . Hypertension   . Joint pain   . Low back pain potentially associated with spinal stenosis   . Neuromuscular disorder ([place])   . Neuropathy of lower extremity    bilateral  . OSA (obstructive sleep apnea)    pt denies   . Osteoarthritis   . PE (pulmonary embolism)   . Pneumonia    hx of x 2   . Ventral hernia     Past Surgical History:  Procedure Laterality Date  . EYE SURGERY    . HERNIA REPAIR  1999  . TOTAL KNEE ARTHROPLASTY Left 08/26/2020   Procedure: LEFT TOTAL KNEE ARTHROPLASTY;  Surgeon: Frederik Pear, MD;  Location: [facility];  Service: Orthopedics;  Laterality: Left;    There were no vitals filed for this visit.    Subjective Assessment - 09/06/20 1104    Subjective Pt is a previous Pt who had Lt TKR on 08/26/20 by Dr. Mayer Camel.   Pt is 11 days s/p surgery and had 5 sessions of home health PT following discharge.  He is using SPC and compression stocking and is icing several times a day.    Pertinent History Lt knee OA with anticipated TKR in March    Limitations Walking;House hold activities    How long can you walk comfortably? room to room in home    Patient Stated Goals pain relief, functional use of Lt LE, walk farther    Currently in Pain? Yes    Pain Score 4     Pain Location Knee    Pain Orientation Left    Pain Descriptors / Indicators Aching    Pain Type Surgical pain    Pain Onset More than a month ago    Pain Frequency Constant    Aggravating Factors  slightest movement can make it hurt, sitting, standing              [facility] PT Assessment - 09/06/20 0001      Assessment   Medical Diagnosis Lt TKR    Referring Provider (PT) Kathalene Frames. Mayer Camel, MD    Next MD Visit 10/10/20  Prior Therapy yes, pre-op PT at this facitilty      Precautions   Precautions Fall      Restrictions   Weight Bearing Restrictions No      Balance Screen   Has the patient fallen in the past 6 months Yes    How many times? 2    Has the patient had a decrease in activity level because of a fear of falling?  Yes    Is the patient reluctant to leave their home because of a fear of falling?  Yes      [place] residence    Living Arrangements Alone    Type of [place] to enter    Entrance Stairs-Number of Steps 5    Entrance Stairs-Rails Right    [place] One level    [place] - single point      Prior Function   Level of Independence Independent with household mobility with device;Independent with basic ADLs;Independent with community mobility with device    Vocation Retired      Observation/Other Assessments   Observations central vertical incision with scabbing present over 75% of scar, LE edema foot to knee, mild warmth inferior aspect Lt  knee, Pt not wearing compression stockings but has them and wears them consistently    Focus on Therapeutic Outcomes (FOTO)  43% goal 63%      Observation/Other Assessments-Edema    Edema Circumferential      Circumferential Edema   Circumferential - Right 46.5cm    Circumferential - Left  52cm      Functional Tests   Functional tests Sit to Stand      Sit to Stand   Comments ind with UE support      ROM / Strength   AROM / PROM / Strength AROM;Strength      AROM   Overall AROM  Deficits    Right/Left Knee Right;Left    Right Knee Extension 0    Right Knee Flexion 125    Left Knee Extension -16    Left Knee Flexion 103      Strength   Overall Strength Comments knee and hip strength 4/5 on Lt    Strength Assessment Site Knee    Right/Left Knee Left    Left Knee Flexion 4/5    Left Knee Extension 4/5      Flexibility   Soft Tissue Assessment /Muscle Length yes    Hamstrings Lt hamstring limited 75%      Palpation   Patella mobility good med/lateral, fair sup/inf    Palpation comment general Lt knee soreness anterior knee and medial/lateral joint lines, Lt hamstring, Lt proximal gastroc      Transfers   Transfers Sit to Stand;Stand to Sit    Sit to Stand 6: Modified independent (Device/Increase time)    Five time sit to stand comments  18 sec w/ UE support bil    Stand to Sit 6: Modified independent (Device/Increase time)      Ambulation/Gait   Ambulation/Gait Yes    Assistive device Straight cane    Gait Pattern Step-through pattern;Left flexed knee in stance;Decreased trunk rotation    Stairs Yes    Stairs Assistance 6: Modified independent (Device/Increase time)    Stair Management Technique One rail Right;Step to pattern;With cane    Number of Stairs 4    Height of Stairs 6      Standardized  Balance Assessment   Five times sit to stand comments  18 sec UE use bil      Timed Up and Go Test   TUG Normal TUG    Normal TUG (seconds) 14    TUG Comments with  Rt UE cane                      Objective measurements completed on examination: See above findings.       [place] Adult PT Treatment/Exercise - 09/06/20 0001      Self-Care   Self-Care Other Self-Care Comments    Other Self-Care Comments  edema management elevation above heart, ice and compression stocking compliance, frequent household walking throughout day, HEP issued and reviewed      Modalities   Modalities Vasopneumatic      Vasopneumatic   Number Minutes Vasopneumatic  10 minutes    Vasopnuematic Location  Knee   Lt   Vasopneumatic Pressure Low    Vasopneumatic Temperature  34 deg      Manual Therapy   Manual Therapy Soft tissue mobilization    Soft tissue mobilization retrograde massage Lt foot to knee, Lt hamstrings                  PT Education - 09/06/20 1147    Education Details Access Code: [email or web address]    Person(s) Educated Patient    Methods Explanation;Demonstration;Handout;Verbal cues    Comprehension Verbalized understanding;Returned demonstration            PT Short Term Goals - 09/06/20 1218      PT SHORT TERM GOAL #1   Title Pt will be ind with HEP and compliant with edema management of Lt LE    Time 2    Period Weeks    Status New    Target Date 09/20/20      PT SHORT TERM GOAL #2   Title Pt will be able to participate in 3' walk test for improved gait endurance.    Time 3    Period Weeks    Status New    Target Date 09/27/20      PT SHORT TERM GOAL #3   Title 5x sit to stand with light UE support as needed 15 sec or less    Baseline 18 sec signif UE support    Time 4    Period Weeks    Status New    Target Date 10/04/20      PT SHORT TERM GOAL #4   Title Lt knee ROM -10 ext or better and at least 110 flexion    Baseline 4    Period Weeks    Status New    Target Date 10/04/20      PT SHORT TERM GOAL #5   Title Lt knee circumferential edema within 2.5 cm of Rt knee    Baseline Lt 52cm, Rt 46.5cm    Time 4     Period Weeks    Status New    Target Date 10/04/20             PT Long Term Goals - 09/06/20 1221      PT LONG TERM GOAL #1   Title Pt will be able to tolerate standing and walking activities for at least 10 min with min pain    Time 8    Period Weeks    Status New    Target Date 11/01/20      PT LONG  TERM GOAL #2   Title Lt knee ROM -8 ext or better and at least 118 flexion    Time 10    Period Weeks    Status New    Target Date 11/15/20      PT LONG TERM GOAL #3   Title TUG in 10 sec using SPC to demo less fall risk and improved function.    Baseline 14 sec    Time 12    Period Weeks    Status New    Target Date 11/29/20      PT LONG TERM GOAL #4   Title Pt will demo improved functional strength of LEs by demo'ing alt stair pattern using single rail and demo 5x sit to stand without UEs </= 13 sec.    Baseline step to using cane and single rail, 18 sec 5x sit to stand    Time 12    Period Weeks    Status New      PT LONG TERM GOAL #5   Title Pt will be able to participate in 6 min walk test with single rest as needed using SPC with min pain demo'ing improved gait endurance.    Time 12    Period Weeks    Status New    Target Date 11/29/20      Additional Long Term Goals   Additional Long Term Goals Yes      PT LONG TERM GOAL #6   Title Improve FOTO  score by at least 20 points from 43% to 63% to demo improved function of Lt LE.    Baseline 43%    Time 12    Period Weeks    Status New    Target Date 11/29/20                  Plan - 09/06/20 1208    Clinical Impression Statement Pt is a previous Pt who is 11 days s/p Lt TKR by Dr. Mayer Camel.  Pt had 5 sessions of home health PT.  He is ambulating with SPC with knee flexed but good weight shift into Lt LE.  He is able to use step to pattern using cane + 1 railing to access home (5 stairs) and demo'd this in clinic today.  Lt knee ROM is -16deg - 103deg compared to Rt knee 0-125 deg.  Lt hip and knee  strength are 4/5 throughout.  Pt was able to perform 5 straight leg raises today and was ind with chair transfers and gait.  He needed min assistance with Lt LE for supine to sit and sit to supine today.  Bandaging was removed yesterday and incision has approx 75% scabbing with good signs of healing.  Signif edema present knee to Lt foot with circumferential measurement at knee 52cm compared to Rt 46.5cm.  Pt reports he is using ice, compression stocking and some elevation but perhaps not as frequently as he should.  5x sit to stand is 18 sec with UE support and TUG is 14 sec using SPC.  PT performed retrograde STM foot to knee on Lt LE in elevation and STM to distal hamstring and knee today.  Vasopneumatic used for edema and initial HEP issued.  PT encouraged frequent bouts of walking to break up static sitting and encouraged elevation of Lt LE above heart at least 3x/day.  Pt will benefit from skilled PT to address deficits and maximize function and safety post-operatively.    Personal Factors and Comorbidities Age;Fitness;Comorbidity  1;Comorbidity 2;Time since onset of injury/illness/exacerbation;Comorbidity 3+    Comorbidities DM neuropathy, obesity, chronic Hx of Lt knee OA    Examination-Activity Limitations Locomotion Level;Transfers;Bed Mobility;Sleep;Squat;Stairs    Examination-Participation Restrictions Cleaning;Community Activity;Shop;Laundry;Meal Prep    Stability/Clinical Decision Making Stable/Uncomplicated    Clinical Decision Making Low    Rehab Potential Good    PT Frequency 2x / week    PT Duration 12 weeks    PT Treatment/Interventions ADLs/Self Care Home Management;Aquatic Therapy;Cryotherapy;Vasopneumatic Device;Electrical Stimulation;Moist Heat;Gait training;Stair training;Functional mobility training;Therapeutic activities;Therapeutic exercise;Neuromuscular re-education;Manual techniques;Balance training;Passive range of motion;Scar mobilization;Patient/family education;Joint  Manipulations    PT Next Visit Plan f/u on edema management compliance, review HEP, work on sit to stand, retrograde massage, hamstring and proximal gastroc STM, try NuStep, gait endurance, progress knee strength as able, Game Ready    PT Home Exercise Plan Access Code: [email or web address]    Consulted and Agree with Plan of Care Patient           Patient will benefit from skilled therapeutic intervention in order to improve the following deficits and impairments:  Abnormal gait,Decreased range of motion,Difficulty walking,Obesity,Decreased endurance,Pain,Decreased activity tolerance,Improper body mechanics,Decreased strength,Decreased mobility,Increased edema,Impaired flexibility  Visit Diagnosis: Chronic pain of left knee - Plan: PT plan of care cert/re-cert  Localized edema - Plan: PT plan of care cert/re-cert  Muscle weakness (generalized) - Plan: PT plan of care cert/re-cert     Problem List Patient Active Problem List   Diagnosis Date Noted  . Hypertension associated with type 2 diabetes mellitus ([place]) 05/23/2020  . Chronic kidney disease (CKD), stage III (moderate) ([place]) 12/13/2019  . Carpal tunnel syndrome 12/05/2019  . Elevated creatine kinase 11/15/2019  . Aortic atherosclerosis ([place]) 11/15/2019  . Recurrent Clostridium difficile diarrhea 05/28/2017  . Peripheral neuropathy 03/17/2017  . Diabetes ([place]) 12/11/2016  . Allergic rhinitis 12/11/2016  . Degenerative arthritis of left knee 01/30/2016  . Class 3 severe obesity with serious comorbidity and body mass index (BMI) of 45.0 to 49.9 in adult ([place]) 01/30/2016  . History of pulmonary embolus (PE) 12/19/2013  . Anxiety state 03/29/2007  . Major depressive disorder 03/29/2007  . OSA (obstructive sleep apnea) 03/29/2007  . Unspecified glaucoma 03/29/2007  . BPH (benign prostatic hyperplasia) 03/29/2007  . OA (osteoarthritis) of knee 03/29/2007  . Essential hypertension 03/29/2007  . History of Bell's palsy 03/29/2007     Baruch Merl, PT 09/06/20 12:30 PM   [facility] [address], [place], [place], [address] Phone: [phone]   Fax:  [phone]  Name: Tanner [name] MRN: [id number] Date of Birth: 04-01-1946

## 2020-09-09 ENCOUNTER — Other Ambulatory Visit: Payer: Self-pay

## 2020-09-09 ENCOUNTER — Ambulatory Visit: Payer: Medicare HMO | Admitting: Physical Therapy

## 2020-09-09 ENCOUNTER — Encounter: Payer: Self-pay | Admitting: Physical Therapy

## 2020-09-09 DIAGNOSIS — R600 Localized edema: Secondary | ICD-10-CM

## 2020-09-09 DIAGNOSIS — M6281 Muscle weakness (generalized): Secondary | ICD-10-CM

## 2020-09-09 DIAGNOSIS — M25562 Pain in left knee: Secondary | ICD-10-CM

## 2020-09-09 DIAGNOSIS — R296 Repeated falls: Secondary | ICD-10-CM | POA: Diagnosis not present

## 2020-09-09 DIAGNOSIS — G8929 Other chronic pain: Secondary | ICD-10-CM | POA: Diagnosis not present

## 2020-09-09 NOTE — Therapy (Signed)
[facility] [address], [place], [place], [address] Phone: [phone]   Fax:  [phone]  Physical Therapy Treatment  Patient Details  Name: Tanner [name] MRN: [id number] Date of Birth: 1945-11-24 Referring Provider (PT): Kathalene Frames. Mayer Camel, MD   Encounter Date: 09/09/2020   PT End of Session - 09/09/20 0933    Visit Number 2    Date for PT Re-Evaluation 11/29/20    Authorization Type Aetna Medicare    Progress Note Due on Visit 20    PT Start Time 0933    PT Stop Time 1025    PT Time Calculation (min) 52 min    Activity Tolerance Patient tolerated treatment well;Patient limited by fatigue;Patient limited by pain    Behavior During Therapy [facility] for tasks assessed/performed           Past Medical History:  Diagnosis Date  . Acute kidney failure ([place])   . Anxiety   . Arthritis   . Back pain   . BPH (benign prostatic hypertrophy)   . Clostridium difficile infection   . Clotting disorder ([place])   . Depression   . Diabetes ([place]) 12/11/2016   type 2   . DVT (deep venous thrombosis) ([place])   . DVT of deep femoral vein, right ([place]) 06/29/2018  . Edema, lower extremity   . High cholesterol   . Hypertension   . Joint pain   . Low back pain potentially associated with spinal stenosis   . Neuromuscular disorder ([place])   . Neuropathy of lower extremity    bilateral  . OSA (obstructive sleep apnea)    pt denies   . Osteoarthritis   . PE (pulmonary embolism)   . Pneumonia    hx of x 2   . Ventral hernia     Past Surgical History:  Procedure Laterality Date  . EYE SURGERY    . HERNIA REPAIR  1999  . TOTAL KNEE ARTHROPLASTY Left 08/26/2020   Procedure: LEFT TOTAL KNEE ARTHROPLASTY;  Surgeon: Frederik Pear, MD;  Location: [facility];  Service: Orthopedics;  Laterality: Left;    There were no vitals filed for this visit.   Subjective Assessment - 09/09/20 0935    Subjective I was up at night on Sat and fell.  I  couldn't get up and had to call my son to come help me.  I felt on my Rt side so don't think I hurt my knee.    Pertinent History Lt knee OA with anticipated TKR in March    Limitations Walking;House hold activities    How long can you walk comfortably? room to room in home    Patient Stated Goals pain relief, functional use of Lt LE, walk farther    Currently in Pain? Yes    Pain Score 4     Pain Location Knee    Pain Orientation Left    Pain Descriptors / Indicators Aching    Pain Type Surgical pain    Pain Onset More than a month ago    Pain Frequency Constant    Aggravating Factors  movement, standing, sitting    Pain Relieving Factors sitting              [facility] PT Assessment - 09/09/20 0001      AROM   Left Knee Extension -10    Left Knee Flexion 110  [place] Adult PT Treatment/Exercise - 09/09/20 0001      Exercises   Exercises Knee/Hip      Knee/Hip Exercises: Stretches   Active Hamstring Stretch Left;3 reps;20 seconds    Active Hamstring Stretch Limitations seated      Knee/Hip Exercises: Aerobic   Nustep L1 seat 10 arms 10 x 6' PT present to monitor pain      Knee/Hip Exercises: Standing   Lateral Step Up Left;2 sets;5 reps;Hand Hold: 2;Step Height: 2"    Forward Step Up Left;2 sets;5 reps;Step Height: 2";Hand Hold: 2    SLS stand on Lt LE march tap Rt foot edge of TM, bil UE support      Knee/Hip Exercises: Seated   Long Arc Quad Left;2 sets;10 reps    Knee/Hip Flexion Lt knee flexion with slider x2'    Other Seated Knee/Hip Exercises heel/toe raises bil x 20    Sit to Sand 5 reps;with UE support      Vasopneumatic   Number Minutes Vasopneumatic  10 minutes    Vasopnuematic Location  Knee    Vasopneumatic Pressure Low    Vasopneumatic Temperature  34 deg      Manual Therapy   Manual Therapy Soft tissue mobilization    Soft tissue mobilization scar massage Lt knee, retrograde massage Lt LE ankle to knee,  medial/lateral joint lines Lt knee                    PT Short Term Goals - 09/06/20 1218      PT SHORT TERM GOAL #1   Title Pt will be ind with HEP and compliant with edema management of Lt LE    Time 2    Period Weeks    Status New    Target Date 09/20/20      PT SHORT TERM GOAL #2   Title Pt will be able to participate in 3' walk test for improved gait endurance.    Time 3    Period Weeks    Status New    Target Date 09/27/20      PT SHORT TERM GOAL #3   Title 5x sit to stand with light UE support as needed 15 sec or less    Baseline 18 sec signif UE support    Time 4    Period Weeks    Status New    Target Date 10/04/20      PT SHORT TERM GOAL #4   Title Lt knee ROM -10 ext or better and at least 110 flexion    Baseline 4    Period Weeks    Status New    Target Date 10/04/20      PT SHORT TERM GOAL #5   Title Lt knee circumferential edema within 2.5 cm of Rt knee    Baseline Lt 52cm, Rt 46.5cm    Time 4    Period Weeks    Status New    Target Date 10/04/20             PT Long Term Goals - 09/06/20 1221      PT LONG TERM GOAL #1   Title Pt will be able to tolerate standing and walking activities for at least 10 min with min pain    Time 8    Period Weeks    Status New    Target Date 11/01/20      PT LONG TERM GOAL #2   Title Lt knee ROM -  8 ext or better and at least 118 flexion    Time 10    Period Weeks    Status New    Target Date 11/15/20      PT LONG TERM GOAL #3   Title TUG in 10 sec using SPC to demo less fall risk and improved function.    Baseline 14 sec    Time 12    Period Weeks    Status New    Target Date 11/29/20      PT LONG TERM GOAL #4   Title Pt will demo improved functional strength of LEs by demo'ing alt stair pattern using single rail and demo 5x sit to stand without UEs </= 13 sec.    Baseline step to using cane and single rail, 18 sec 5x sit to stand    Time 12    Period Weeks    Status New      PT LONG  TERM GOAL #5   Title Pt will be able to participate in 6 min walk test with single rest as needed using SPC with min pain demo'ing improved gait endurance.    Time 12    Period Weeks    Status New    Target Date 11/29/20      Additional Long Term Goals   Additional Long Term Goals Yes      PT LONG TERM GOAL #6   Title Improve FOTO  score by at least 20 points from 43% to 63% to demo improved function of Lt LE.    Baseline 43%    Time 12    Period Weeks    Status New    Target Date 11/29/20                 Plan - 09/09/20 1008    Clinical Impression Statement Pt had a fall in the night as he adjusted his recliner on Saturday night.  He had to call his son to help him get up.  He fell to his right and doesn't feel he injured his knee.  He had an abrasion on Rt elbow that was bandaged.  He continues to have signif edema in Lt knee to foot but was wearing compression stockings.  STM and vasopneumatic used for edema.  Pt has gained ROM into both extension (-10 from -16) and flexion (110 from 103) since eval.  He was able to perform 2" riser step ups and Lt SLS march taps with Rt foot today with improving quad activation.  Close supervision used for in-clinic amb with straight cane for safety due to history of falls.    Comorbidities DM neuropathy, obesity, chronic Hx of Lt knee OA    Rehab Potential Good    PT Frequency 2x / week    PT Duration 12 weeks    PT Treatment/Interventions ADLs/Self Care Home Management;Aquatic Therapy;Cryotherapy;Vasopneumatic Device;Electrical Stimulation;Moist Heat;Gait training;Stair training;Functional mobility training;Therapeutic activities;Therapeutic exercise;Neuromuscular re-education;Manual techniques;Balance training;Passive range of motion;Scar mobilization;Patient/family education;Joint Manipulations    PT Next Visit Plan f/u on edema management compliance, review HEP, work on sit to stand, retrograde massage, hamstring and proximal gastroc STM,   NuStep L2, gait endurance, progress knee strength as able, Game Ready    PT Home Exercise Plan Access Code: [email or web address]    Consulted and Agree with Plan of Care Patient           Patient will benefit from skilled therapeutic intervention in order to improve the following deficits and impairments:  Visit Diagnosis: Chronic pain of left knee  Localized edema  Muscle weakness (generalized)     Problem List Patient Active Problem List   Diagnosis Date Noted  . Hypertension associated with type 2 diabetes mellitus ([place]) 05/23/2020  . Chronic kidney disease (CKD), stage III (moderate) ([place]) 12/13/2019  . Carpal tunnel syndrome 12/05/2019  . Elevated creatine kinase 11/15/2019  . Aortic atherosclerosis ([place]) 11/15/2019  . Recurrent Clostridium difficile diarrhea 05/28/2017  . Peripheral neuropathy 03/17/2017  . Diabetes ([place]) 12/11/2016  . Allergic rhinitis 12/11/2016  . Degenerative arthritis of left knee 01/30/2016  . Class 3 severe obesity with serious comorbidity and body mass index (BMI) of 45.0 to 49.9 in adult ([place]) 01/30/2016  . History of pulmonary embolus (PE) 12/19/2013  . Anxiety state 03/29/2007  . Major depressive disorder 03/29/2007  . OSA (obstructive sleep apnea) 03/29/2007  . Unspecified glaucoma 03/29/2007  . BPH (benign prostatic hyperplasia) 03/29/2007  . OA (osteoarthritis) of knee 03/29/2007  . Essential hypertension 03/29/2007  . History of Bell's palsy 03/29/2007    Baruch Merl, PT 09/09/20 10:15 AM   [facility] [address], [place], [place], [address] Phone: [phone]   Fax:  [phone]  Name: ELVIN MCCARTIN MRN: [id number] Date of Birth: Jan 22, 1946

## 2020-09-11 ENCOUNTER — Ambulatory Visit: Payer: Medicare HMO | Admitting: Physical Therapy

## 2020-09-11 ENCOUNTER — Encounter: Payer: Self-pay | Admitting: Physical Therapy

## 2020-09-11 ENCOUNTER — Other Ambulatory Visit: Payer: Self-pay

## 2020-09-11 DIAGNOSIS — G8929 Other chronic pain: Secondary | ICD-10-CM | POA: Diagnosis not present

## 2020-09-11 DIAGNOSIS — M25562 Pain in left knee: Secondary | ICD-10-CM | POA: Diagnosis not present

## 2020-09-11 DIAGNOSIS — M6281 Muscle weakness (generalized): Secondary | ICD-10-CM | POA: Diagnosis not present

## 2020-09-11 DIAGNOSIS — R296 Repeated falls: Secondary | ICD-10-CM | POA: Diagnosis not present

## 2020-09-11 DIAGNOSIS — R600 Localized edema: Secondary | ICD-10-CM

## 2020-09-11 NOTE — Therapy (Signed)
[facility] [address], [place], [place], [address] Phone: [phone]   Fax:  [phone]  Physical Therapy Treatment  Patient Details  Name: Tanner [name] MRN: [id number] Date of Birth: Jun 01, 1946 Referring Provider (PT): Kathalene Frames. Mayer Camel, MD   Encounter Date: 09/11/2020   PT End of Session - 09/11/20 1045    Visit Number 3    Date for PT Re-Evaluation 11/29/20    Authorization Type [employer] Medicare    Progress Note Due on Visit 20    PT Start Time 1005    PT Stop Time 1105    PT Time Calculation (min) 60 min    Activity Tolerance Patient tolerated treatment well;Patient limited by fatigue;Patient limited by pain           Past Medical History:  Diagnosis Date  . Acute kidney failure ([place])   . Anxiety   . Arthritis   . Back pain   . BPH (benign prostatic hypertrophy)   . Clostridium difficile infection   . Clotting disorder ([place])   . Depression   . Diabetes ([place]) 12/11/2016   type 2   . DVT (deep venous thrombosis) ([place])   . DVT of deep femoral vein, right ([place]) 06/29/2018  . Edema, lower extremity   . High cholesterol   . Hypertension   . Joint pain   . Low back pain potentially associated with spinal stenosis   . Neuromuscular disorder ([place])   . Neuropathy of lower extremity    bilateral  . OSA (obstructive sleep apnea)    pt denies   . Osteoarthritis   . PE (pulmonary embolism)   . Pneumonia    hx of x 2   . Ventral hernia     Past Surgical History:  Procedure Laterality Date  . EYE SURGERY    . HERNIA REPAIR  1999  . TOTAL KNEE ARTHROPLASTY Left 08/26/2020   Procedure: LEFT TOTAL KNEE ARTHROPLASTY;  Surgeon: Frederik Pear, MD;  Location: [facility];  Service: Orthopedics;  Laterality: Left;    There were no vitals filed for this visit.   Subjective Assessment - 09/11/20 1007    Subjective My knee is more swollen today and my pain is up to a 5/10.  Still not sleeping very well.     Pertinent History Lt knee OA with anticipated TKR in March    Limitations Walking;House hold activities    How long can you walk comfortably? room to room in home    Patient Stated Goals pain relief, functional use of Lt LE, walk farther    Currently in Pain? Yes    Pain Score 5     Pain Location Knee    Pain Orientation Left    Pain Descriptors / Indicators Aching    Pain Type Surgical pain    Pain Onset More than a month ago    Pain Frequency Constant    Aggravating Factors  stand, sit, walk              [facility] PT Assessment - 09/11/20 0001      AROM   Left Knee Extension -8    Left Knee Flexion 105                         [facility] Adult PT Treatment/Exercise - 09/11/20 0001      Knee/Hip Exercises: Stretches   Active Hamstring Stretch Left;3 reps;10 seconds    Active Hamstring  Stretch Limitations foot on 2nd step    Other Knee/Hip Stretches Lt knee towel prop under heel for knee ext x 2'      Knee/Hip Exercises: Aerobic   Nustep L2 seat 9 arms 10 x 6' end of session PT present to monitor      Knee/Hip Exercises: Standing   Knee Flexion Left;10 reps    Knee Flexion Limitations foot on 2nd step      Knee/Hip Exercises: Seated   Long Arc Quad Left;1 set;10 reps    Sit to [employer] 5 reps;without UE support   table + black pad     Knee/Hip Exercises: Supine   Short Arc Quad Sets Strengthening;Left;1 set;10 reps    Short Arc Quad Sets Limitations 5 sec hold    Heel Slides Left;10 reps    Straight Leg Raises Strengthening;Left;10 reps    Straight Leg Raises Limitations from SAQ bolster      Vasopneumatic   Number Minutes Vasopneumatic  15 minutes    Vasopnuematic Location  Knee    Vasopneumatic Pressure Low    Vasopneumatic Temperature  34 deg      Manual Therapy   Manual Therapy Passive ROM;Soft tissue mobilization;Joint mobilization    Joint Mobilization patellar mobs all 4 planes    Soft tissue mobilization scar massage Lt knee, retrograde massage Lt  LE ankle to knee, medial/lateral joint lines Lt knee, inferior anterior aspect of Lt knee    Passive ROM Lt knee flex/ext supine                    PT Short Term Goals - 09/11/20 1054      PT SHORT TERM GOAL #1   Title Pt will be ind with HEP and compliant with edema management of Lt LE    Status On-going      PT SHORT TERM GOAL #2   Title Pt will be able to participate in 3' walk test for improved gait endurance.    Status On-going      PT SHORT TERM GOAL #3   Title 5x sit to stand with light UE support as needed 15 sec or less    Status On-going      PT SHORT TERM GOAL #4   Title Lt knee ROM -10 ext or better and at least 110 flexion    Baseline -8 for ext 3/33/22    Status On-going             PT Long Term Goals - 09/06/20 1221      PT LONG TERM GOAL #1   Title Pt will be able to tolerate standing and walking activities for at least 10 min with min pain    Time 8    Period Weeks    Status New    Target Date 11/01/20      PT LONG TERM GOAL #2   Title Lt knee ROM -8 ext or better and at least 118 flexion    Time 10    Period Weeks    Status New    Target Date 11/15/20      PT LONG TERM GOAL #3   Title TUG in 10 sec using SPC to demo less fall risk and improved function.    Baseline 14 sec    Time 12    Period Weeks    Status New    Target Date 11/29/20      PT LONG TERM GOAL #4   Title Pt  will demo improved functional strength of LEs by demo'ing alt stair pattern using single rail and demo 5x sit to stand without UEs </= 13 sec.    Baseline step to using cane and single rail, 18 sec 5x sit to stand    Time 12    Period Weeks    Status New      PT LONG TERM GOAL #5   Title Pt will be able to participate in 6 min walk test with single rest as needed using SPC with min pain demo'ing improved gait endurance.    Time 12    Period Weeks    Status New    Target Date 11/29/20      Additional Long Term Goals   Additional Long Term Goals Yes       PT LONG TERM GOAL #6   Title Improve FOTO  score by at least 20 points from 43% to 63% to demo improved function of Lt LE.    Baseline 43%    Time 12    Period Weeks    Status New    Target Date 11/29/20                 Plan - 09/11/20 1046    Clinical Impression Statement Pt reported increased pain to 5/10 today.  He continues to ambulate slowly with good WB into Lt LE, with flexed knee and use of single point cane.  Scar continues to heal well with less than 20% scabbing present.  Ongoing edema noted surrounding lower leg and Lt knee.  ROM improved slightly for ext (-8 from -10 deg today) and flexion regressed slightly to 105 deg.  Manual techniques for P/ROM, STM, scar massage and patellar mobs were used today with Pt report of relief with this and use of NuStep.  Pt was able to perform sit to stand without UE support today but with lateral knee pain.  He is activating Lt quad well and was able to perform SLRs from SAQ bolster today x 10 reps.  Continue along POC with ongoing edema management and functional strength.    Comorbidities DM neuropathy, obesity, chronic Hx of Lt knee OA    PT Frequency 2x / week    PT Duration 12 weeks    PT Next Visit Plan f/u on edema management compliance, review HEP, work on sit to stand, retrograde massage, hamstring and proximal gastroc STM,  NuStep L2, gait endurance, progress knee strength as able, Game Ready    PT Home Exercise Plan Access Code: [email or web address]    Consulted and Agree with Plan of Care Patient           Patient will benefit from skilled therapeutic intervention in order to improve the following deficits and impairments:     Visit Diagnosis: Chronic pain of left knee  Localized edema  Muscle weakness (generalized)  Repeated falls     Problem List Patient Active Problem List   Diagnosis Date Noted  . Hypertension associated with type 2 diabetes mellitus ([place]) 05/23/2020  . Chronic kidney disease (CKD), stage III  (moderate) ([place]) 12/13/2019  . Carpal tunnel syndrome 12/05/2019  . Elevated creatine kinase 11/15/2019  . Aortic atherosclerosis ([place]) 11/15/2019  . Recurrent Clostridium difficile diarrhea 05/28/2017  . Peripheral neuropathy 03/17/2017  . Diabetes ([place]) 12/11/2016  . Allergic rhinitis 12/11/2016  . Degenerative arthritis of left knee 01/30/2016  . Class 3 severe obesity with serious comorbidity and body mass index (BMI) of 45.0 to 49.9 in  adult ([place]) 01/30/2016  . History of pulmonary embolus (PE) 12/19/2013  . Anxiety state 03/29/2007  . Major depressive disorder 03/29/2007  . OSA (obstructive sleep apnea) 03/29/2007  . Unspecified glaucoma 03/29/2007  . BPH (benign prostatic hyperplasia) 03/29/2007  . OA (osteoarthritis) of knee 03/29/2007  . Essential hypertension 03/29/2007  . History of Bell's palsy 03/29/2007    Baruch Merl, PT 09/11/20 10:55 AM   [facility] [address], [place], [place], [address] Phone: [phone]   Fax:  [phone]  Name: Tanner [name] MRN: [id number] Date of Birth: June 26, 1945

## 2020-09-12 ENCOUNTER — Other Ambulatory Visit (INDEPENDENT_AMBULATORY_CARE_PROVIDER_SITE_OTHER): Payer: Self-pay | Admitting: Family Medicine

## 2020-09-12 ENCOUNTER — Other Ambulatory Visit (INDEPENDENT_AMBULATORY_CARE_PROVIDER_SITE_OTHER): Payer: Self-pay | Admitting: Medical

## 2020-09-12 NOTE — Telephone Encounter (Signed)
Reviewed active medications.  Parameters for refill met per departmental refill protocol . Refill sent electronically to pharmacy.  Order forwarded to provider for [facility]

## 2020-09-13 ENCOUNTER — Ambulatory Visit: Payer: Medicare HMO | Attending: Family Medicine | Admitting: Physical Therapy

## 2020-09-13 ENCOUNTER — Encounter: Payer: Self-pay | Admitting: Physical Therapy

## 2020-09-13 ENCOUNTER — Other Ambulatory Visit: Payer: Self-pay

## 2020-09-13 DIAGNOSIS — M6281 Muscle weakness (generalized): Secondary | ICD-10-CM | POA: Insufficient documentation

## 2020-09-13 DIAGNOSIS — M25562 Pain in left knee: Secondary | ICD-10-CM | POA: Insufficient documentation

## 2020-09-13 DIAGNOSIS — G8929 Other chronic pain: Secondary | ICD-10-CM | POA: Diagnosis not present

## 2020-09-13 DIAGNOSIS — R600 Localized edema: Secondary | ICD-10-CM | POA: Diagnosis not present

## 2020-09-13 DIAGNOSIS — R296 Repeated falls: Secondary | ICD-10-CM | POA: Insufficient documentation

## 2020-09-13 NOTE — Therapy (Signed)
[facility] [address] [place], [place], [address] Phone: [phone]   Fax:  [phone]  Physical Therapy Treatment  Patient Details  Name: Tanner [name] MRN: [id number] Date of Birth: [birth year] Referring Provider (PT): Kathalene Frames. Mayer Camel, MD   Encounter Date: 09/13/2020   PT End of Session - 09/13/20 1010    Visit Number 4    Date for PT Re-Evaluation 11/29/20    Authorization Type [employer]na Medicare    Progress Note Due on Visit 20    PT Start Time 1011    PT Stop Time 1100    PT Time Calculation (min) 49 min    Equipment Utilized During Treatment Gait belt    Behavior During Therapy WFL for tasks assessed/performed           Past Medical History:  Diagnosis Date  . Acute kidney failure ([place])   . Anxiety   . Arthritis   . Back pain   . BPH (benign prostatic hypertrophy)   . Clostridium difficile infection   . Clotting disorder ([place])   . Depression   . Diabetes ([place]) 12/11/2016   type 2   . DVT (deep venous thrombosis) ([place])   . DVT of deep femoral vein, right ([place]) 06/29/2018  . Edema, lower extremity   . High cholesterol   . Hypertension   . Joint pain   . Low back pain potentially associated with spinal stenosis   . Neuromuscular disorder ([place])   . Neuropathy of lower extremity    bilateral  . OSA (obstructive sleep apnea)    pt denies   . Osteoarthritis   . PE (pulmonary embolism)   . Pneumonia    hx of x 2   . Ventral hernia     Past Surgical History:  Procedure Laterality Date  . EYE SURGERY    . HERNIA REPAIR  1999  . TOTAL KNEE ARTHROPLASTY Left 08/26/2020   Procedure: LEFT TOTAL KNEE ARTHROPLASTY;  Surgeon: Frederik Pear, MD;  Location: [facility];  Service: Orthopedics;  Laterality: Left;    There were no vitals filed for this visit.   Subjective Assessment - 09/13/20 1012    Subjective Knee swelling slightly better, less reddness, still stiff and sore.    Currently in Pain? Yes     Pain Score 4     Pain Location Knee    Pain Orientation Left    Pain Descriptors / Indicators Sore;Tightness    Multiple Pain Sites No              [facility] PT Assessment - 09/13/20 0001      AROM   Left Knee Flexion 110                         [facility] Adult PT Treatment/Exercise - 09/13/20 0001      Knee/Hip Exercises: Aerobic   Nustep L2 x 8 min with PTA present to discuss status.      Knee/Hip Exercises: Seated   Long Arc Quad Left;10 reps;2 sets    Hamstring Curl --   red band 10x   Sit to Sand --   2x10 on hi low table working on = weight distribution     Vasopneumatic   Number Minutes Vasopneumatic  15 minutes    Vasopnuematic Location  Knee    Vasopneumatic Pressure Low    Vasopneumatic Temperature  34 deg      Manual Therapy  Joint Mobilization patellar mobs all 4 planes    Soft tissue mobilization scar massage Lt knee, retrograde massage Lt LE ankle to knee, medial/lateral joint lines Lt knee, inferior anterior aspect of Lt knee    Passive ROM Lt knee flex/ext seated EOB on hi low table.                    PT Short Term Goals - 09/13/20 1044      PT SHORT TERM GOAL #1   Title Pt will be ind with HEP and compliant with edema management of Lt LE    Time 2    Period Weeks    Status Achieved    Target Date 09/20/20      PT SHORT TERM GOAL #4   Title Lt knee ROM -10 ext or better and at least 110 flexion    Period Weeks    Status Achieved             PT Long Term Goals - 09/06/20 1221      PT LONG TERM GOAL #1   Title Pt will be able to tolerate standing and walking activities for at least 10 min with min pain    Time 8    Period Weeks    Status New    Target Date 11/01/20      PT LONG TERM GOAL #2   Title Lt knee ROM -8 ext or better and at least 118 flexion    Time 10    Period Weeks    Status New    Target Date 11/15/20      PT LONG TERM GOAL #3   Title TUG in 10 sec using SPC to demo less fall risk and improved  function.    Baseline 14 sec    Time 12    Period Weeks    Status New    Target Date 11/29/20      PT LONG TERM GOAL #4   Title Pt will demo improved functional strength of LEs by demo'ing alt stair pattern using single rail and demo 5x sit to stand without UEs </= 13 sec.    Baseline step to using cane and single rail, 18 sec 5x sit to stand    Time 12    Period Weeks    Status New      PT LONG TERM GOAL #5   Title Pt will be able to participate in 6 min walk test with single rest as needed using SPC with min pain demo'ing improved gait endurance.    Time 12    Period Weeks    Status New    Target Date 11/29/20      Additional Long Term Goals   Additional Long Term Goals Yes      PT LONG TERM GOAL #6   Title Improve FOTO  score by at least 20 points from 43% to 63% to demo improved function of Lt LE.    Baseline 43%    Time 12    Period Weeks    Status New    Target Date 11/29/20                 Plan - 09/13/20 1011    Clinical Impression Statement Pt making progress this week: improved AROm in both directions ( meeting STG ) less edema & redness as well. Associated knee musculature resting tone softening allowing for greater AROM. Pt does not consistently bear weight into his  LTLE upon sit to stand which we worked on today and pt will continue to work on upon getting up out of car and chairs at home.    Personal Factors and Comorbidities Age;Fitness;Comorbidity 1;Comorbidity 2;Time since onset of injury/illness/exacerbation;Comorbidity 3+    Comorbidities DM neuropathy, obesity, chronic Hx of Lt knee OA    Examination-Activity Limitations Locomotion Level;Transfers;Bed Mobility;Sleep;Squat;Stairs    Examination-Participation Restrictions Cleaning;Community Activity;Shop;Laundry;Meal Prep    Stability/Clinical Decision Making Stable/Uncomplicated    Rehab Potential Good    PT Frequency 2x / week    PT Duration 12 weeks    PT Treatment/Interventions ADLs/Self Care  Home Management;Aquatic Therapy;Cryotherapy;Vasopneumatic Device;Electrical Stimulation;Moist Heat;Gait training;Stair training;Functional mobility training;Therapeutic activities;Therapeutic exercise;Neuromuscular re-education;Manual techniques;Balance training;Passive range of motion;Scar mobilization;Patient/family education;Joint Manipulations    PT Next Visit Plan nustep, sit to stand from chair height with equal weightbearing, add light resisatnce to LAQ, measure circumference in next 1-2 visits. Game Ready.    PT Home Exercise Plan Access Code: [email or web address]    Consulted and Agree with Plan of Care Patient           Patient will benefit from skilled therapeutic intervention in order to improve the following deficits and impairments:  Abnormal gait,Decreased range of motion,Difficulty walking,Obesity,Decreased endurance,Pain,Decreased activity tolerance,Improper body mechanics,Decreased strength,Decreased mobility,Increased edema,Impaired flexibility  Visit Diagnosis: Chronic pain of left knee  Localized edema  Muscle weakness (generalized)  Repeated falls     Problem List Patient Active Problem List   Diagnosis Date Noted  . Hypertension associated with type 2 diabetes mellitus ([place]) 05/23/2020  . Chronic kidney disease (CKD), stage III (moderate) ([place]) 12/13/2019  . Carpal tunnel syndrome 12/05/2019  . Elevated creatine kinase 11/15/2019  . Aortic atherosclerosis ([place]) 11/15/2019  . Recurrent Clostridium difficile diarrhea 05/28/2017  . Peripheral neuropathy 03/17/2017  . Diabetes ([place]) 12/11/2016  . Allergic rhinitis 12/11/2016  . Degenerative arthritis of left knee 01/30/2016  . Class 3 severe obesity with serious comorbidity and body mass index (BMI) of 45.0 to 49.9 in adult ([place]) 01/30/2016  . History of pulmonary embolus (PE) 12/19/2013  . Anxiety state 03/29/2007  . Major depressive disorder 03/29/2007  . OSA (obstructive sleep apnea) 03/29/2007  . Unspecified  glaucoma 03/29/2007  . BPH (benign prostatic hyperplasia) 03/29/2007  . OA (osteoarthritis) of knee 03/29/2007  . Essential hypertension 03/29/2007  . History of Bell's palsy 03/29/2007    Nunzio Banet, PTA 09/13/2020, 10:56 AM  [facility] [address], [place], [place], [address] Phone: [phone]   Fax:  [phone]  Name: JOSHUE BADAL MRN: [id number] Date of Birth: [birth year]

## 2020-09-16 ENCOUNTER — Ambulatory Visit: Payer: Medicare HMO | Admitting: Physical Therapy

## 2020-09-16 ENCOUNTER — Other Ambulatory Visit: Payer: Self-pay

## 2020-09-16 ENCOUNTER — Encounter: Payer: Self-pay | Admitting: Physical Therapy

## 2020-09-16 DIAGNOSIS — G8929 Other chronic pain: Secondary | ICD-10-CM

## 2020-09-16 DIAGNOSIS — R296 Repeated falls: Secondary | ICD-10-CM

## 2020-09-16 DIAGNOSIS — M6281 Muscle weakness (generalized): Secondary | ICD-10-CM | POA: Diagnosis not present

## 2020-09-16 DIAGNOSIS — R600 Localized edema: Secondary | ICD-10-CM | POA: Diagnosis not present

## 2020-09-16 DIAGNOSIS — M25562 Pain in left knee: Secondary | ICD-10-CM

## 2020-09-16 NOTE — Therapy (Signed)
[facility] [address] [place], [place], [address] Phone: [phone]   Fax:  [phone]  Physical Therapy Treatment  Patient Details  Name: Tanner [name] MRN: [id number] Date of Birth: [birth year] Referring Provider (PT): Kathalene Frames. Mayer Camel, MD   Encounter Date: 09/16/2020   PT End of Session - 09/16/20 1019    Visit Number 5    Date for PT Re-Evaluation 11/29/20    Authorization Type [employer] Medicare    PT Start Time 1014    PT Stop Time 1110    PT Time Calculation (min) 56 min    Activity Tolerance Patient tolerated treatment well    Behavior During Therapy WFL for tasks assessed/performed           Past Medical History:  Diagnosis Date  . Acute kidney failure ([place])   . Anxiety   . Arthritis   . Back pain   . BPH (benign prostatic hypertrophy)   . Clostridium difficile infection   . Clotting disorder ([place])   . Depression   . Diabetes ([place]) 12/11/2016   type 2   . DVT (deep venous thrombosis) ([place])   . DVT of deep femoral vein, right ([place]) 06/29/2018  . Edema, lower extremity   . High cholesterol   . Hypertension   . Joint pain   . Low back pain potentially associated with spinal stenosis   . Neuromuscular disorder ([place])   . Neuropathy of lower extremity    bilateral  . OSA (obstructive sleep apnea)    pt denies   . Osteoarthritis   . PE (pulmonary embolism)   . Pneumonia    hx of x 2   . Ventral hernia     Past Surgical History:  Procedure Laterality Date  . EYE SURGERY    . HERNIA REPAIR  1999  . TOTAL KNEE ARTHROPLASTY Left 08/26/2020   Procedure: LEFT TOTAL KNEE ARTHROPLASTY;  Surgeon: Frederik Pear, MD;  Location: [facility];  Service: Orthopedics;  Laterality: Left;    There were no vitals filed for this visit.   Subjective Assessment - 09/16/20 1020    Subjective A littel stiff and sore    Pertinent History Lt knee OA with anticipated TKR in March    Currently in Pain? Yes    Pain  Score 2     Pain Location Knee    Pain Orientation Left    Pain Descriptors / Indicators Tightness;Sore    Multiple Pain Sites No              [facility] PT Assessment - 09/16/20 0001      6 Minute Walk- Baseline   6 Minute Walk- Baseline yes      6 minute walk test results    Endurance additional comments 3 min walk test with SPC on level surface 212 feet                         [facility] Adult PT Treatment/Exercise - 09/16/20 0001      Knee/Hip Exercises: Aerobic   Nustep L2 x 8 min with PTA present to discuss status.      Knee/Hip Exercises: Standing   Forward Step Up Left;1 set;10 reps;Step Height: 4";Hand Hold: 2      Knee/Hip Exercises: Seated   Long Arc Quad Strengthening;Left;1 set;10 reps;Weights    Long Arc Quad Weight 3 lbs.    Hamstring Curl Strengthening;Left;2 sets;10 reps   blue tband  Knee/Hip Exercises: Supine   Heel Slides Left;10 reps      Vasopneumatic   Number Minutes Vasopneumatic  15 minutes    Vasopnuematic Location  Knee    Vasopneumatic Pressure Medium    Vasopneumatic Temperature  34 deg      Manual Therapy   Soft tissue mobilization scar massage Lt knee, retrograde massage Lt LE ankle to knee, medial/lateral joint lines Lt knee, inferior anterior aspect of Lt knee    Passive ROM Lt knee flex/ext seated EOB on hi low table.                    PT Short Term Goals - 09/16/20 1034      PT SHORT TERM GOAL #2   Title Pt will be able to participate in 3' walk test for improved gait endurance.    Baseline 212 feet with SPC    Period Weeks    Status Achieved    Target Date 09/27/20             PT Long Term Goals - 09/06/20 1221      PT LONG TERM GOAL #1   Title Pt will be able to tolerate standing and walking activities for at least 10 min with min pain    Time 8    Period Weeks    Status New    Target Date 11/01/20      PT LONG TERM GOAL #2   Title Lt knee ROM -8 ext or better and at least 118 flexion     Time 10    Period Weeks    Status New    Target Date 11/15/20      PT LONG TERM GOAL #3   Title TUG in 10 sec using SPC to demo less fall risk and improved function.    Baseline 14 sec    Time 12    Period Weeks    Status New    Target Date 11/29/20      PT LONG TERM GOAL #4   Title Pt will demo improved functional strength of LEs by demo'ing alt stair pattern using single rail and demo 5x sit to stand without UEs </= 13 sec.    Baseline step to using cane and single rail, 18 sec 5x sit to stand    Time 12    Period Weeks    Status New      PT LONG TERM GOAL #5   Title Pt will be able to participate in 6 min walk test with single rest as needed using SPC with min pain demo'ing improved gait endurance.    Time 12    Period Weeks    Status New    Target Date 11/29/20      Additional Long Term Goals   Additional Long Term Goals Yes      PT LONG TERM GOAL #6   Title Improve FOTO  score by at least 20 points from 43% to 63% to demo improved function of Lt LE.    Baseline 43%    Time 12    Period Weeks    Status New    Target Date 11/29/20                 Plan - 09/16/20 1045    Clinical Impression Statement Swelling continues to decrease. Pt performed a 3 min walk test today establishing a baseline of 212 feet with SPC. Pt tolerating resistance for quad strength and  began stepping up on 4 inch box today.    Personal Factors and Comorbidities Age;Fitness;Comorbidity 1;Comorbidity 2;Time since onset of injury/illness/exacerbation;Comorbidity 3+    Comorbidities DM neuropathy, obesity, chronic Hx of Lt knee OA    Examination-Activity Limitations Locomotion Level;Transfers;Bed Mobility;Sleep;Squat;Stairs    Examination-Participation Restrictions Cleaning;Community Activity;Shop;Laundry;Meal Prep    Stability/Clinical Decision Making Stable/Uncomplicated    Rehab Potential Good    PT Frequency 2x / week    PT Duration 12 weeks    PT Treatment/Interventions ADLs/Self  Care Home Management;Aquatic Therapy;Cryotherapy;Vasopneumatic Device;Electrical Stimulation;Moist Heat;Gait training;Stair training;Functional mobility training;Therapeutic activities;Therapeutic exercise;Neuromuscular re-education;Manual techniques;Balance training;Passive range of motion;Scar mobilization;Patient/family education;Joint Manipulations    PT Next Visit Plan measure circumference next visit for goals, knee strength and ROM    PT Home Exercise Plan Access Code: [email or web address]    Consulted and Agree with Plan of Care Patient           Patient will benefit from skilled therapeutic intervention in order to improve the following deficits and impairments:  Abnormal gait,Decreased range of motion,Difficulty walking,Obesity,Decreased endurance,Pain,Decreased activity tolerance,Improper body mechanics,Decreased strength,Decreased mobility,Increased edema,Impaired flexibility  Visit Diagnosis: Chronic pain of left knee  Localized edema  Muscle weakness (generalized)  Repeated falls     Problem List Patient Active Problem List   Diagnosis Date Noted  . Hypertension associated with type 2 diabetes mellitus ([place]) 05/23/2020  . Chronic kidney disease (CKD), stage III (moderate) ([place]) 12/13/2019  . Carpal tunnel syndrome 12/05/2019  . Elevated creatine kinase 11/15/2019  . Aortic atherosclerosis ([place]) 11/15/2019  . Recurrent Clostridium difficile diarrhea 05/28/2017  . Peripheral neuropathy 03/17/2017  . Diabetes ([place]) 12/11/2016  . Allergic rhinitis 12/11/2016  . Degenerative arthritis of left knee 01/30/2016  . Class 3 severe obesity with serious comorbidity and body mass index (BMI) of 45.0 to 49.9 in adult ([place]) 01/30/2016  . History of pulmonary embolus (PE) 12/19/2013  . Anxiety state 03/29/2007  . Major depressive disorder 03/29/2007  . OSA (obstructive sleep apnea) 03/29/2007  . Unspecified glaucoma 03/29/2007  . BPH (benign prostatic hyperplasia) 03/29/2007  . OA  (osteoarthritis) of knee 03/29/2007  . Essential hypertension 03/29/2007  . History of Bell's palsy 03/29/2007    Natalija Mavis, PTA 09/16/2020, 10:57 AM  [facility] [address], [place], [place], [address] Phone: [phone]   Fax:  [phone]  Name: YULIAN GOSNEY MRN: [id number] Date of Birth: 1946/05/28

## 2020-09-18 ENCOUNTER — Encounter: Payer: Self-pay | Admitting: Physical Therapy

## 2020-09-18 ENCOUNTER — Other Ambulatory Visit: Payer: Self-pay

## 2020-09-18 ENCOUNTER — Ambulatory Visit: Payer: Medicare HMO | Admitting: Physical Therapy

## 2020-09-18 DIAGNOSIS — M25562 Pain in left knee: Secondary | ICD-10-CM | POA: Diagnosis not present

## 2020-09-18 DIAGNOSIS — R296 Repeated falls: Secondary | ICD-10-CM

## 2020-09-18 DIAGNOSIS — G8929 Other chronic pain: Secondary | ICD-10-CM

## 2020-09-18 DIAGNOSIS — R600 Localized edema: Secondary | ICD-10-CM

## 2020-09-18 DIAGNOSIS — M6281 Muscle weakness (generalized): Secondary | ICD-10-CM

## 2020-09-18 NOTE — Therapy (Signed)
[facility] [address] [place], [place], [address] Phone: [phone]   Fax:  [phone]  Physical Therapy Treatment  Patient Details  Name: Tanner [name] MRN: [id number] Date of Birth: 11-Apr-1946 Referring Provider (PT): Kathalene Frames. Mayer Camel, MD   Encounter Date: 09/18/2020   PT End of Session - 09/18/20 1059    Visit Number 6    Date for PT Re-Evaluation 11/29/20    Authorization Type Aetna Medicare    Progress Note Due on Visit 20    PT Start Time 1015    PT Stop Time 1110   15 min vaso   PT Time Calculation (min) 55 min           Past Medical History:  Diagnosis Date  . Acute kidney failure ([place])   . Anxiety   . Arthritis   . Back pain   . BPH (benign prostatic hypertrophy)   . Clostridium difficile infection   . Clotting disorder ([place])   . Depression   . Diabetes ([place]) 12/11/2016   type 2   . DVT (deep venous thrombosis) ([place])   . DVT of deep femoral vein, right ([place]) 06/29/2018  . Edema, lower extremity   . High cholesterol   . Hypertension   . Joint pain   . Low back pain potentially associated with spinal stenosis   . Neuromuscular disorder ([place])   . Neuropathy of lower extremity    bilateral  . OSA (obstructive sleep apnea)    pt denies   . Osteoarthritis   . PE (pulmonary embolism)   . Pneumonia    hx of x 2   . Ventral hernia     Past Surgical History:  Procedure Laterality Date  . EYE SURGERY    . HERNIA REPAIR  1999  . TOTAL KNEE ARTHROPLASTY Left 08/26/2020   Procedure: LEFT TOTAL KNEE ARTHROPLASTY;  Surgeon: Frederik Pear, MD;  Location: [facility];  Service: Orthopedics;  Laterality: Left;    There were no vitals filed for this visit.   Subjective Assessment - 09/18/20 1018    Subjective Feeling tired and sore.  This is a bigger recovery than I imagined.    Pertinent History Lt knee OA with anticipated TKR in March    Limitations Walking;House hold activities    How long can you  walk comfortably? room to room in home    Patient Stated Goals pain relief, functional use of Lt LE, walk farther    Currently in Pain? Yes    Pain Score 3     Pain Location Knee    Pain Orientation Left    Pain Descriptors / Indicators Sore    Pain Type Surgical pain    Pain Onset More than a month ago    Pain Frequency Constant    Aggravating Factors  stand, sit, walk    Pain Relieving Factors sitting              [facility] PT Assessment - 09/18/20 0001      Circumferential Edema   Circumferential - Right 46.5    Circumferential - Left  50.5      AROM   Left Knee Extension -2    Left Knee Flexion 110                         [facility] Adult PT Treatment/Exercise - 09/18/20 0001      Exercises   Exercises Knee/Hip  Knee/Hip Exercises: Machines for Strengthening   Cybex Leg Press 50lb bil LEs 10x5 sec hold, needed assist getting on/off equipment, VC to push through heels      Knee/Hip Exercises: Standing   Forward Step Up Left;10 reps;Hand Hold: 2;Step Height: 6";1 set      Knee/Hip Exercises: Seated   Long Arc Quad Strengthening;Left;10 reps;Weights;2 sets    Long Arc Quad Weight 3 lbs.    Hamstring Curl Strengthening;Left;2 sets;10 reps   red tband   Sit to Sand 10 reps   chair + black pad     Vasopneumatic   Number Minutes Vasopneumatic  15 minutes    Vasopnuematic Location  Knee    Vasopneumatic Pressure Medium    Vasopneumatic Temperature  34 deg      Manual Therapy   Soft tissue mobilization distal Lt quad, scar massage, STM with knee flexion for quad elongation    Passive ROM Lt knee flex/ext seated EOB on hi low table.                    PT Short Term Goals - 09/18/20 1237      PT SHORT TERM GOAL #1   Title Pt will be ind with HEP and compliant with edema management of Lt LE    Status Achieved      PT SHORT TERM GOAL #2   Title Pt will be able to participate in 3' walk test for improved gait endurance.    Baseline 212 feet  with SPC    Status Achieved      PT SHORT TERM GOAL #3   Title 5x sit to stand with light UE support as needed 15 sec or less    Status On-going      PT SHORT TERM GOAL #4   Title Lt knee ROM -10 ext or better and at least 110 flexion    Status Achieved      PT SHORT TERM GOAL #5   Title Lt knee circumferential edema within 2.5 cm of Rt knee    Baseline Lt 50.5cm, Rt 46.5cm    Status On-going             PT Long Term Goals - 09/06/20 1221      PT LONG TERM GOAL #1   Title Pt will be able to tolerate standing and walking activities for at least 10 min with min pain    Time 8    Period Weeks    Status New    Target Date 11/01/20      PT LONG TERM GOAL #2   Title Lt knee ROM -8 ext or better and at least 118 flexion    Time 10    Period Weeks    Status New    Target Date 11/15/20      PT LONG TERM GOAL #3   Title TUG in 10 sec using SPC to demo less fall risk and improved function.    Baseline 14 sec    Time 12    Period Weeks    Status New    Target Date 11/29/20      PT LONG TERM GOAL #4   Title Pt will demo improved functional strength of LEs by demo'ing alt stair pattern using single rail and demo 5x sit to stand without UEs </= 13 sec.    Baseline step to using cane and single rail, 18 sec 5x sit to stand    Time 12  Period Weeks    Status New      PT LONG TERM GOAL #5   Title Pt will be able to participate in 6 min walk test with single rest as needed using SPC with min pain demo'ing improved gait endurance.    Time 12    Period Weeks    Status New    Target Date 11/29/20      Additional Long Term Goals   Additional Long Term Goals Yes      PT LONG TERM GOAL #6   Title Improve FOTO  score by at least 20 points from 43% to 63% to demo improved function of Lt LE.    Baseline 43%    Time 12    Period Weeks    Status New    Target Date 11/29/20                 Plan - 09/18/20 1101    Clinical Impression Statement Pt has reduced  circumferential edema by .5cm since initial eval but continues to have 4cm difference between Lt/Rt knee.  He has ROM measuring -2 ext to 110 flexion with flexion limitation secondary to distal tightness of quad, scar tissue and edema.  STM to quad and knee improves ease with end range knee flexion.  PT trialed leg press today but Pt needed assistance to get on/off equipment today.  He porgressed step ups to 6" from 4" today with good tolerance.  Vasopneumatic used end of session due to ongoing edema.  Continue along POC with ongoing assessment.    Comorbidities DM neuropathy, obesity, chronic Hx of Lt knee OA    Rehab Potential Good    PT Frequency 2x / week    PT Duration 12 weeks    PT Treatment/Interventions ADLs/Self Care Home Management;Aquatic Therapy;Cryotherapy;Vasopneumatic Device;Electrical Stimulation;Moist Heat;Gait training;Stair training;Functional mobility training;Therapeutic activities;Therapeutic exercise;Neuromuscular re-education;Manual techniques;Balance training;Passive range of motion;Scar mobilization;Patient/family education;Joint Manipulations    PT Home Exercise Plan Access Code: [email or web address]           Patient will benefit from skilled therapeutic intervention in order to improve the following deficits and impairments:     Visit Diagnosis: Chronic pain of left knee  Localized edema  Muscle weakness (generalized)  Repeated falls     Problem List Patient Active Problem List   Diagnosis Date Noted  . Hypertension associated with type 2 diabetes mellitus ([place]) 05/23/2020  . Chronic kidney disease (CKD), stage III (moderate) ([place]) 12/13/2019  . Carpal tunnel syndrome 12/05/2019  . Elevated creatine kinase 11/15/2019  . Aortic atherosclerosis ([place]) 11/15/2019  . Recurrent Clostridium difficile diarrhea 05/28/2017  . Peripheral neuropathy 03/17/2017  . Diabetes ([place]) 12/11/2016  . Allergic rhinitis 12/11/2016  . Degenerative arthritis of left knee 01/30/2016  .  Class 3 severe obesity with serious comorbidity and body mass index (BMI) of 45.0 to 49.9 in adult ([place]) 01/30/2016  . History of pulmonary embolus (PE) 12/19/2013  . Anxiety state 03/29/2007  . Major depressive disorder 03/29/2007  . OSA (obstructive sleep apnea) 03/29/2007  . Unspecified glaucoma 03/29/2007  . BPH (benign prostatic hyperplasia) 03/29/2007  . OA (osteoarthritis) of knee 03/29/2007  . Essential hypertension 03/29/2007  . History of Bell's palsy 03/29/2007    Baruch Merl, PT 09/18/20 12:38 PM   [facility] [address] [place], [place], [address] Phone: [phone]   Fax:  [phone]  Name: ESTUS KRAKOWSKI MRN: [id number] Date of Birth: Jun 22, 1945

## 2020-09-20 ENCOUNTER — Ambulatory Visit: Payer: Medicare HMO | Admitting: Physical Therapy

## 2020-09-20 ENCOUNTER — Other Ambulatory Visit: Payer: Self-pay

## 2020-09-20 ENCOUNTER — Encounter: Payer: Self-pay | Admitting: Physical Therapy

## 2020-09-20 DIAGNOSIS — M25562 Pain in left knee: Secondary | ICD-10-CM | POA: Diagnosis not present

## 2020-09-20 DIAGNOSIS — G8929 Other chronic pain: Secondary | ICD-10-CM | POA: Diagnosis not present

## 2020-09-20 DIAGNOSIS — R600 Localized edema: Secondary | ICD-10-CM

## 2020-09-20 DIAGNOSIS — M6281 Muscle weakness (generalized): Secondary | ICD-10-CM

## 2020-09-20 DIAGNOSIS — R296 Repeated falls: Secondary | ICD-10-CM | POA: Diagnosis not present

## 2020-09-20 NOTE — Therapy (Signed)
[facility] [address] [place], [place], [address] Phone: [phone]   Fax:  [phone]  Physical Therapy Treatment  Patient Details  Name: Tanner [name] MRN: [id number] Date of Birth: 1945-11-27 Referring Provider (PT): Kathalene Frames. Mayer Camel, MD   Encounter Date: 09/20/2020   PT End of Session - 09/20/20 1017    Visit Number 7    Date for PT Re-Evaluation 11/29/20    Authorization Type [employer] Medicare    Progress Note Due on Visit 20    PT Start Time 1012    PT Stop Time 1101    PT Time Calculation (min) 49 min    Activity Tolerance Patient tolerated treatment well    Behavior During Therapy WFL for tasks assessed/performed           Past Medical History:  Diagnosis Date  . Acute kidney failure ([place])   . Anxiety   . Arthritis   . Back pain   . BPH (benign prostatic hypertrophy)   . Clostridium difficile infection   . Clotting disorder ([place])   . Depression   . Diabetes ([place]) 12/11/2016   type 2   . DVT (deep venous thrombosis) ([place])   . DVT of deep femoral vein, right ([place]) 06/29/2018  . Edema, lower extremity   . High cholesterol   . Hypertension   . Joint pain   . Low back pain potentially associated with spinal stenosis   . Neuromuscular disorder ([employer])   . Neuropathy of lower extremity    bilateral  . OSA (obstructive sleep apnea)    pt denies   . Osteoarthritis   . PE (pulmonary embolism)   . Pneumonia    hx of x 2   . Ventral hernia     Past Surgical History:  Procedure Laterality Date  . EYE SURGERY    . HERNIA REPAIR  1999  . TOTAL KNEE ARTHROPLASTY Left 08/26/2020   Procedure: LEFT TOTAL KNEE ARTHROPLASTY;  Surgeon: Frederik Pear, MD;  Location: [facility];  Service: Orthopedics;  Laterality: Left;    There were no vitals filed for this visit.   Subjective Assessment - 09/20/20 1019    Subjective Doing well this AM.    Pertinent History Lt knee OA with anticipated TKR in March     Limitations Walking;House hold activities    Patient Stated Goals pain relief, functional use of Lt LE, walk farther    Currently in Pain? Yes    Pain Score 2     Pain Location Knee    Pain Orientation Left    Pain Descriptors / Indicators Dull;Aching    Multiple Pain Sites No                             [facility] Adult PT Treatment/Exercise - 09/20/20 0001      Knee/Hip Exercises: Aerobic   Nustep L2 x  11min      Knee/Hip Exercises: Standing   Lateral Step Up Left;1 set;10 reps;Hand Hold: 2;Step Height: 4"    Forward Step Up Left;1 set;10 reps;Hand Hold: 2;Step Height: 6"   CGA     Knee/Hip Exercises: Seated   Long Arc Quad Strengthening;Left;2 sets;10 reps;Weights    Long Arc Quad Weight 4 lbs.    Hamstring Curl Strengthening;Left;2 sets;10 reps   blue tband     Vasopneumatic   Number Minutes Vasopneumatic  15 minutes    Vasopnuematic Location  Knee    Vasopneumatic Pressure Medium    Vasopneumatic Temperature  3 flakes      Manual Therapy   Joint Mobilization patella mobs all planes    Soft tissue mobilization distal Lt quad, scar massage, STM with knee flexion for quad elongation    Passive ROM Lt knee flex/ext seated EOB on hi low table.                    PT Short Term Goals - 09/18/20 1237      PT SHORT TERM GOAL #1   Title Pt will be ind with HEP and compliant with edema management of Lt LE    Status Achieved      PT SHORT TERM GOAL #2   Title Pt will be able to participate in 3' walk test for improved gait endurance.    Baseline 212 feet with SPC    Status Achieved      PT SHORT TERM GOAL #3   Title 5x sit to stand with light UE support as needed 15 sec or less    Status On-going      PT SHORT TERM GOAL #4   Title Lt knee ROM -10 ext or better and at least 110 flexion    Status Achieved      PT SHORT TERM GOAL #5   Title Lt knee circumferential edema within 2.5 cm of Rt knee    Baseline Lt 50.5cm, Rt 46.5cm    Status  On-going             PT Long Term Goals - 09/06/20 1221      PT LONG TERM GOAL #1   Title Pt will be able to tolerate standing and walking activities for at least 10 min with min pain    Time 8    Period Weeks    Status New    Target Date 11/01/20      PT LONG TERM GOAL #2   Title Lt knee ROM -8 ext or better and at least 118 flexion    Time 10    Period Weeks    Status New    Target Date 11/15/20      PT LONG TERM GOAL #3   Title TUG in 10 sec using SPC to demo less fall risk and improved function.    Baseline 14 sec    Time 12    Period Weeks    Status New    Target Date 11/29/20      PT LONG TERM GOAL #4   Title Pt will demo improved functional strength of LEs by demo'ing alt stair pattern using single rail and demo 5x sit to stand without UEs </= 13 sec.    Baseline step to using cane and single rail, 18 sec 5x sit to stand    Time 12    Period Weeks    Status New      PT LONG TERM GOAL #5   Title Pt will be able to participate in 6 min walk test with single rest as needed using SPC with min pain demo'ing improved gait endurance.    Time 12    Period Weeks    Status New    Target Date 11/29/20      Additional Long Term Goals   Additional Long Term Goals Yes      PT LONG TERM GOAL #6   Title Improve FOTO  score by at least 20 points from  43% to 63% to demo improved function of Lt LE.    Baseline 43%    Time 12    Period Weeks    Status New    Target Date 11/29/20                 Plan - 09/20/20 1018    Clinical Impression Statement Pt arrives with minimal knee pain today. Pt progressed quad resisatnce today with success focusing on quad strength. As pt was performing step ups his quad fatigued on rep # 7 spo much the knee gave way slightly and pt had to catch his balance. We reduced the height of the box to 4" and this was much better.    Personal Factors and Comorbidities Age;Fitness;Comorbidity 1;Comorbidity 2;Time since onset of  injury/illness/exacerbation;Comorbidity 3+    Comorbidities DM neuropathy, obesity, chronic Hx of Lt knee OA    Examination-Activity Limitations Locomotion Level;Transfers;Bed Mobility;Sleep;Squat;Stairs    Examination-Participation Restrictions Cleaning;Community Activity;Shop;Laundry;Meal Prep    Stability/Clinical Decision Making Stable/Uncomplicated    Rehab Potential Good    PT Frequency 2x / week    PT Duration 12 weeks    PT Treatment/Interventions ADLs/Self Care Home Management;Aquatic Therapy;Cryotherapy;Vasopneumatic Device;Electrical Stimulation;Moist Heat;Gait training;Stair training;Functional mobility training;Therapeutic activities;Therapeutic exercise;Neuromuscular re-education;Manual techniques;Balance training;Passive range of motion;Scar mobilization;Patient/family education;Joint Manipulations    PT Next Visit Plan Knee strength and ROM, step ups if tolerated, vaso    PT Home Exercise Plan Access Code: [email or web address]    Consulted and Agree with Plan of Care Patient           Patient will benefit from skilled therapeutic intervention in order to improve the following deficits and impairments:  Abnormal gait,Decreased range of motion,Difficulty walking,Obesity,Decreased endurance,Pain,Decreased activity tolerance,Improper body mechanics,Decreased strength,Decreased mobility,Increased edema,Impaired flexibility  Visit Diagnosis: Chronic pain of left knee  Localized edema  Muscle weakness (generalized)  Repeated falls     Problem List Patient Active Problem List   Diagnosis Date Noted  . Hypertension associated with type 2 diabetes mellitus ([place]) 05/23/2020  . Chronic kidney disease (CKD), stage III (moderate) ([place]) 12/13/2019  . Carpal tunnel syndrome 12/05/2019  . Elevated creatine kinase 11/15/2019  . Aortic atherosclerosis ([place]) 11/15/2019  . Recurrent Clostridium difficile diarrhea 05/28/2017  . Peripheral neuropathy 03/17/2017  . Diabetes ([place]) 12/11/2016  .  Allergic rhinitis 12/11/2016  . Degenerative arthritis of left knee 01/30/2016  . Class 3 severe obesity with serious comorbidity and body mass index (BMI) of 45.0 to 49.9 in adult ([place]) 01/30/2016  . History of pulmonary embolus (PE) 12/19/2013  . Anxiety state 03/29/2007  . Major depressive disorder 03/29/2007  . OSA (obstructive sleep apnea) 03/29/2007  . Unspecified glaucoma 03/29/2007  . BPH (benign prostatic hyperplasia) 03/29/2007  . OA (osteoarthritis) of knee 03/29/2007  . Essential hypertension 03/29/2007  . History of Bell's palsy 03/29/2007    Tysheena Ginzburg, PTA 09/20/2020, 10:45 AM  [facility] [address] [place], [place], [address] Phone: [phone]   Fax:  [phone]  Name: Tanner [name] MRN: [id number] Date of Birth: 09/18/1945

## 2020-09-23 ENCOUNTER — Ambulatory Visit: Payer: Medicare HMO | Admitting: Physical Therapy

## 2020-09-23 ENCOUNTER — Encounter: Payer: Self-pay | Admitting: Physical Therapy

## 2020-09-23 ENCOUNTER — Other Ambulatory Visit: Payer: Self-pay

## 2020-09-23 DIAGNOSIS — M6281 Muscle weakness (generalized): Secondary | ICD-10-CM

## 2020-09-23 DIAGNOSIS — M25562 Pain in left knee: Secondary | ICD-10-CM | POA: Diagnosis not present

## 2020-09-23 DIAGNOSIS — R296 Repeated falls: Secondary | ICD-10-CM

## 2020-09-23 DIAGNOSIS — G8929 Other chronic pain: Secondary | ICD-10-CM | POA: Diagnosis not present

## 2020-09-23 DIAGNOSIS — R600 Localized edema: Secondary | ICD-10-CM

## 2020-09-23 NOTE — Therapy (Signed)
[facility] [address] [place], [place], [address] Phone: [phone]   Fax:  [phone]  Physical Therapy Treatment  Patient Details  Name: Tanner [name] MRN: [id number] Date of Birth: August 14, 1945 Referring Provider (PT): Kathalene Frames. Mayer Camel, MD   Encounter Date: 09/23/2020   PT End of Session - 09/23/20 1017    Visit Number 8    Date for PT Re-Evaluation 11/29/20    Authorization Type [employer]na Medicare    Progress Note Due on Visit 20    PT Start Time 1016    PT Stop Time 1115    PT Time Calculation (min) 59 min    Activity Tolerance Patient tolerated treatment well    Behavior During Therapy WFL for tasks assessed/performed           Past Medical History:  Diagnosis Date  . Acute kidney failure ([place])   . Anxiety   . Arthritis   . Back pain   . BPH (benign prostatic hypertrophy)   . Clostridium difficile infection   . Clotting disorder ([place])   . Depression   . Diabetes ([place]) 12/11/2016   type 2   . DVT (deep venous thrombosis) ([place])   . DVT of deep femoral vein, right ([place]) 06/29/2018  . Edema, lower extremity   . High cholesterol   . Hypertension   . Joint pain   . Low back pain potentially associated with spinal stenosis   . Neuromuscular disorder ([place])   . Neuropathy of lower extremity    bilateral  . OSA (obstructive sleep apnea)    pt denies   . Osteoarthritis   . PE (pulmonary embolism)   . Pneumonia    hx of x 2   . Ventral hernia     Past Surgical History:  Procedure Laterality Date  . EYE SURGERY    . HERNIA REPAIR  1999  . TOTAL KNEE ARTHROPLASTY Left 08/26/2020   Procedure: LEFT TOTAL KNEE ARTHROPLASTY;  Surgeon: Frederik Pear, MD;  Location: [facility];  Service: Orthopedics;  Laterality: Left;    There were no vitals filed for this visit.   Subjective Assessment - 09/23/20 1019    Subjective My knee is a little achey this AM.    Pertinent History Lt knee OA with anticipated TKR in  March    Currently in Pain? Yes    Pain Score 2     Pain Location Knee    Pain Orientation Left    Aggravating Factors  Not sure, just intermittent    Pain Relieving Factors Sitting, rest, ice machine    Multiple Pain Sites No              [facility] PT Assessment - 09/23/20 0001      Circumferential Edema   Circumferential - Left  50      Strength   Left Hip Extension 4+/5      6 minute walk test results    Endurance additional comments 3 min wALK test 312 feet with SPC                         [facility] Adult PT Treatment/Exercise - 09/23/20 0001      Knee/Hip Exercises: Aerobic   Nustep L2 x  62min      Knee/Hip Exercises: Seated   Long Arc Quad Strengthening;Left;3 sets;10 reps;Weights    Long Arc Quad Weight 4 lbs.    Hamstring Curl Strengthening;Left;10 reps;3  sets   blue tband   Sit to Sand 2 sets;10 reps;without UE support      Vasopneumatic   Number Minutes Vasopneumatic  15 minutes    Vasopnuematic Location  Knee    Vasopneumatic Pressure Medium    Vasopneumatic Temperature  3 flakes      Manual Therapy   Joint Mobilization patella mobs all planes    Soft tissue mobilization distal Lt quad, scar massage, STM with knee flexion for quad elongation    Passive ROM Lt knee flex/ext seated EOB on hi low table.                    PT Short Term Goals - 09/18/20 1237      PT SHORT TERM GOAL #1   Title Pt will be ind with HEP and compliant with edema management of Lt LE    Status Achieved      PT SHORT TERM GOAL #2   Title Pt will be able to participate in 3' walk test for improved gait endurance.    Baseline 212 feet with SPC    Status Achieved      PT SHORT TERM GOAL #3   Title 5x sit to stand with light UE support as needed 15 sec or less    Status On-going      PT SHORT TERM GOAL #4   Title Lt knee ROM -10 ext or better and at least 110 flexion    Status Achieved      PT SHORT TERM GOAL #5   Title Lt knee circumferential edema  within 2.5 cm of Rt knee    Baseline Lt 50.5cm, Rt 46.5cm    Status On-going             PT Long Term Goals - 09/06/20 1221      PT LONG TERM GOAL #1   Title Pt will be able to tolerate standing and walking activities for at least 10 min with min pain    Time 8    Period Weeks    Status New    Target Date 11/01/20      PT LONG TERM GOAL #2   Title Lt knee ROM -8 ext or better and at least 118 flexion    Time 10    Period Weeks    Status New    Target Date 11/15/20      PT LONG TERM GOAL #3   Title TUG in 10 sec using SPC to demo less fall risk and improved function.    Baseline 14 sec    Time 12    Period Weeks    Status New    Target Date 11/29/20      PT LONG TERM GOAL #4   Title Pt will demo improved functional strength of LEs by demo'ing alt stair pattern using single rail and demo 5x sit to stand without UEs </= 13 sec.    Baseline step to using cane and single rail, 18 sec 5x sit to stand    Time 12    Period Weeks    Status New      PT LONG TERM GOAL #5   Title Pt will be able to participate in 6 min walk test with single rest as needed using SPC with min pain demo'ing improved gait endurance.    Time 12    Period Weeks    Status New    Target Date 11/29/20  Additional Long Term Goals   Additional Long Term Goals Yes      PT LONG TERM GOAL #6   Title Improve FOTO  score by at least 20 points from 43% to 63% to demo improved function of Lt LE.    Baseline 43%    Time 12    Period Weeks    Status New    Target Date 11/29/20                 Plan - 09/23/20 1020    Clinical Impression Statement Pt arrives with a little aching in his knee. Edema continues to improve/lessen. Circumference down .5 cm today  for LT knee. MMT LT quad improved 1/2 grade and pt walked 100 more feet during the 3 min walk test.    Personal Factors and Comorbidities Age;Fitness;Comorbidity 1;Comorbidity 2;Time since onset of injury/illness/exacerbation;Comorbidity 3+     Comorbidities DM neuropathy, obesity, chronic Hx of Lt knee OA    Examination-Activity Limitations Locomotion Level;Transfers;Bed Mobility;Sleep;Squat;Stairs    Examination-Participation Restrictions Cleaning;Community Activity;Shop;Laundry;Meal Prep    Stability/Clinical Decision Making Stable/Uncomplicated    Rehab Potential Good    PT Frequency 2x / week    PT Duration 12 weeks    PT Treatment/Interventions ADLs/Self Care Home Management;Aquatic Therapy;Cryotherapy;Vasopneumatic Device;Electrical Stimulation;Moist Heat;Gait training;Stair training;Functional mobility training;Therapeutic activities;Therapeutic exercise;Neuromuscular re-education;Manual techniques;Balance training;Passive range of motion;Scar mobilization;Patient/family education;Joint Manipulations    PT Next Visit Plan Work towards 6 min walk test for LTG, test TUG & sit to stand test in next 1-2 visits.    PT Home Exercise Plan Access Code: [email or web address]    Consulted and Agree with Plan of Care Patient           Patient will benefit from skilled therapeutic intervention in order to improve the following deficits and impairments:  Abnormal gait,Decreased range of motion,Difficulty walking,Obesity,Decreased endurance,Pain,Decreased activity tolerance,Improper body mechanics,Decreased strength,Decreased mobility,Increased edema,Impaired flexibility  Visit Diagnosis: Localized edema  Chronic pain of left knee  Muscle weakness (generalized)  Repeated falls     Problem List Patient Active Problem List   Diagnosis Date Noted  . Hypertension associated with type 2 diabetes mellitus ([place]) 05/23/2020  . Chronic kidney disease (CKD), stage III (moderate) ([place]) 12/13/2019  . Carpal tunnel syndrome 12/05/2019  . Elevated creatine kinase 11/15/2019  . Aortic atherosclerosis ([place]) 11/15/2019  . Recurrent Clostridium difficile diarrhea 05/28/2017  . Peripheral neuropathy 03/17/2017  . Diabetes ([place]) 12/11/2016  . Allergic  rhinitis 12/11/2016  . Degenerative arthritis of left knee 01/30/2016  . Class 3 severe obesity with serious comorbidity and body mass index (BMI) of 45.0 to 49.9 in adult ([place]) 01/30/2016  . History of pulmonary embolus (PE) 12/19/2013  . Anxiety state 03/29/2007  . Major depressive disorder 03/29/2007  . OSA (obstructive sleep apnea) 03/29/2007  . Unspecified glaucoma 03/29/2007  . BPH (benign prostatic hyperplasia) 03/29/2007  . OA (osteoarthritis) of knee 03/29/2007  . Essential hypertension 03/29/2007  . History of Bell's palsy 03/29/2007    Sir Mallis, PTA 09/23/2020, 10:58 AM  [facility] [address] [place], [place], [address] Phone: [phone]   Fax:  [phone]  Name: Tanner [name] MRN: [id number] Date of Birth: [birth year]

## 2020-09-25 ENCOUNTER — Encounter: Payer: Medicare HMO | Admitting: Physical Therapy

## 2020-09-26 ENCOUNTER — Encounter: Payer: Self-pay | Admitting: Physical Therapy

## 2020-09-26 ENCOUNTER — Ambulatory Visit: Payer: Medicare HMO | Admitting: Physical Therapy

## 2020-09-26 ENCOUNTER — Other Ambulatory Visit: Payer: Self-pay

## 2020-09-26 DIAGNOSIS — M6281 Muscle weakness (generalized): Secondary | ICD-10-CM | POA: Diagnosis not present

## 2020-09-26 DIAGNOSIS — R296 Repeated falls: Secondary | ICD-10-CM | POA: Diagnosis not present

## 2020-09-26 DIAGNOSIS — R600 Localized edema: Secondary | ICD-10-CM

## 2020-09-26 DIAGNOSIS — G8929 Other chronic pain: Secondary | ICD-10-CM

## 2020-09-26 DIAGNOSIS — M25562 Pain in left knee: Secondary | ICD-10-CM | POA: Diagnosis not present

## 2020-09-26 NOTE — Therapy (Addendum)
[facility] [address] [place], [place], [address] Phone: [phone]   Fax:  [phone]  Physical Therapy Treatment  Patient Details  Name: Tanner [name] MRN: [id number] Date of Birth: 19-Aug-1945 Referring Provider (PT): Kathalene Frames. Mayer Camel, MD   Encounter Date: 09/26/2020   PT End of Session - 09/26/20 1130    Visit Number 9    Date for PT Re-Evaluation 11/29/20    Authorization Type [employer] Medicare    Progress Note Due on Visit 20    PT Start Time 1100    PT Stop Time 1142    PT Time Calculation (min) 42 min    Activity Tolerance Patient tolerated treatment well    Behavior During Therapy WFL for tasks assessed/performed           Past Medical History:  Diagnosis Date  . Acute kidney failure ([place])   . Anxiety   . Arthritis   . Back pain   . BPH (benign prostatic hypertrophy)   . Clostridium difficile infection   . Clotting disorder ([place])   . Depression   . Diabetes ([place]) 12/11/2016   type 2   . DVT (deep venous thrombosis) ([place])   . DVT of deep femoral vein, right ([place]) 06/29/2018  . Edema, lower extremity   . High cholesterol   . Hypertension   . Joint pain   . Low back pain potentially associated with spinal stenosis   . Neuromuscular disorder ([place])   . Neuropathy of lower extremity    bilateral  . OSA (obstructive sleep apnea)    pt denies   . Osteoarthritis   . PE (pulmonary embolism)   . Pneumonia    hx of x 2   . Ventral hernia     Past Surgical History:  Procedure Laterality Date  . EYE SURGERY    . HERNIA REPAIR  1999  . TOTAL KNEE ARTHROPLASTY Left 08/26/2020   Procedure: LEFT TOTAL KNEE ARTHROPLASTY;  Surgeon: Frederik Pear, MD;  Location: [facility];  Service: Orthopedics;  Laterality: Left;    There were no vitals filed for this visit.   Subjective Assessment - 09/26/20 1101    Subjective Reports increased fatigue this date.    Pertinent History Lt knee OA with anticipated TKR  in March    Limitations Walking;House hold activities    How long can you walk comfortably? room to room in home    Patient Stated Goals pain relief, functional use of Lt LE, walk farther    Currently in Pain? Yes    Pain Score 2     Pain Location Knee    Pain Orientation Left              [facility] PT Assessment - 09/26/20 0001      Standardized Balance Assessment   Standardized Balance Assessment Five Times Sit to Stand    Five times sit to stand comments  13s                         [facility] Adult PT Treatment/Exercise - 09/26/20 0001      Knee/Hip Exercises: Aerobic   Nustep L3 x 13min      Knee/Hip Exercises: Seated   Long Arc Quad Strengthening;Left;3 sets;10 reps;Weights    Long Arc Quad Weight 4 lbs.    Hamstring Curl Strengthening;Left;10 reps;3 sets    Hamstring Limitations blue tband    Sit to Sand 15 reps;without  UE support      Vasopneumatic   Number Minutes Vasopneumatic  10 minutes    Vasopnuematic Location  Knee    Vasopneumatic Pressure Medium    Vasopneumatic Temperature  3 flakes      Manual Therapy   Soft tissue mobilization distal Lt quad, scar massage, retrograde massage for decreased swelling; in knee flexion for quad elongation    Passive ROM Lt knee flex/ext seated EOB on hi low table.                    PT Short Term Goals - 09/26/20 1112      PT SHORT TERM GOAL #3   Title 5x sit to stand with light UE support as needed 15 sec or less    Baseline 18 sec signif UE support    Time 4    Period Weeks   13s from mat table; pushing up on thighs   Status Achieved      PT SHORT TERM GOAL #5   Title Lt knee circumferential edema within 2.5 cm of Rt knee    Baseline Lt 50.5cm, Rt 46.5cm    Time 4    Period Weeks    Status On-going   Lt 46; Rt 40 (measured at center of patella)   Target Date 10/04/20             PT Long Term Goals - 09/06/20 1221      PT LONG TERM GOAL #1   Title Pt will be able to tolerate standing  and walking activities for at least 10 min with min pain    Time 8    Period Weeks    Status New    Target Date 11/01/20      PT LONG TERM GOAL #2   Title Lt knee ROM -8 ext or better and at least 118 flexion    Time 10    Period Weeks    Status New    Target Date 11/15/20      PT LONG TERM GOAL #3   Title TUG in 10 sec using SPC to demo less fall risk and improved function.    Baseline 14 sec    Time 12    Period Weeks    Status New    Target Date 11/29/20      PT LONG TERM GOAL #4   Title Pt will demo improved functional strength of LEs by demo'ing alt stair pattern using single rail and demo 5x sit to stand without UEs </= 13 sec.    Baseline step to using cane and single rail, 18 sec 5x sit to stand    Time 12    Period Weeks    Status New      PT LONG TERM GOAL #5   Title Pt will be able to participate in 6 min walk test with single rest as needed using SPC with min pain demo'ing improved gait endurance.    Time 12    Period Weeks    Status New    Target Date 11/29/20      Additional Long Term Goals   Additional Long Term Goals Yes      PT LONG TERM GOAL #6   Title Improve FOTO  score by at least 20 points from 43% to 63% to demo improved function of Lt LE.    Baseline 43%    Time 12    Period Weeks    Status New  Target Date 11/29/20                 Plan - 09/26/20 1127    Clinical Impression Statement Patient reports increased fatigue this date. 5x sit to stand significantly improved as patient performing in 13s. Noting continued bil LE muscular endurance impairments as patient requiring increased UE support by pushing through thighs as fatigue progressing when performing sit to stand activity. Activity tolerance improved as patient tolerating increased resistance on Nustep. Would benefit from continued skilled intervention to address impairments for improved functional mobility.    Comorbidities DM neuropathy, obesity, chronic Hx of Lt knee OA     Examination-Activity Limitations Locomotion Level;Transfers;Bed Mobility;Sleep;Squat;Stairs    Examination-Participation Restrictions Cleaning;Community Activity;Shop;Laundry;Meal Prep    Rehab Potential Good    PT Frequency 2x / week    PT Duration 12 weeks    PT Treatment/Interventions ADLs/Self Care Home Management;Aquatic Therapy;Cryotherapy;Vasopneumatic Device;Electrical Stimulation;Moist Heat;Gait training;Stair training;Functional mobility training;Therapeutic activities;Therapeutic exercise;Neuromuscular re-education;Manual techniques;Balance training;Passive range of motion;Scar mobilization;Patient/family education;Joint Manipulations    PT Next Visit Plan test TUG and begin progressing towars 6 min walk test; continue LE adding functional component as able    PT Home Exercise Plan Access Code: [email or web address]    Consulted and Agree with Plan of Care Patient           Patient will benefit from skilled therapeutic intervention in order to improve the following deficits and impairments:  Abnormal gait,Decreased range of motion,Difficulty walking,Obesity,Decreased endurance,Pain,Decreased activity tolerance,Improper body mechanics,Decreased strength,Decreased mobility,Increased edema,Impaired flexibility  Visit Diagnosis: Localized edema  Chronic pain of left knee  Muscle weakness (generalized)  Repeated falls     Problem List Patient Active Problem List   Diagnosis Date Noted  . Hypertension associated with type 2 diabetes mellitus ([place]) 05/23/2020  . Chronic kidney disease (CKD), stage III (moderate) ([place]) 12/13/2019  . Carpal tunnel syndrome 12/05/2019  . Elevated creatine kinase 11/15/2019  . Aortic atherosclerosis ([place]) 11/15/2019  . Recurrent Clostridium difficile diarrhea 05/28/2017  . Peripheral neuropathy 03/17/2017  . Diabetes ([place]) 12/11/2016  . Allergic rhinitis 12/11/2016  . Degenerative arthritis of left knee 01/30/2016  . Class 3 severe obesity with serious  comorbidity and body mass index (BMI) of 45.0 to 49.9 in adult ([place]) 01/30/2016  . History of pulmonary embolus (PE) 12/19/2013  . Anxiety state 03/29/2007  . Major depressive disorder 03/29/2007  . OSA (obstructive sleep apnea) 03/29/2007  . Unspecified glaucoma 03/29/2007  . BPH (benign prostatic hyperplasia) 03/29/2007  . OA (osteoarthritis) of knee 03/29/2007  . Essential hypertension 03/29/2007  . History of Bell's palsy 03/29/2007   Everardo All PT, DPT  09/26/20 11:41 AM  [facility] [address] [place], [place], [address] Phone: [phone]   Fax:  [phone]  Name: STAVROS CAIL MRN: [id number] Date of Birth: [birth year]

## 2020-09-30 ENCOUNTER — Other Ambulatory Visit: Payer: Self-pay

## 2020-09-30 ENCOUNTER — Encounter: Payer: Self-pay | Admitting: Physical Therapy

## 2020-09-30 ENCOUNTER — Ambulatory Visit: Payer: Medicare HMO | Admitting: Physical Therapy

## 2020-09-30 ENCOUNTER — Encounter (INDEPENDENT_AMBULATORY_CARE_PROVIDER_SITE_OTHER): Payer: Self-pay | Admitting: Family Medicine

## 2020-09-30 DIAGNOSIS — M6281 Muscle weakness (generalized): Secondary | ICD-10-CM

## 2020-09-30 DIAGNOSIS — G8929 Other chronic pain: Secondary | ICD-10-CM | POA: Diagnosis not present

## 2020-09-30 DIAGNOSIS — R600 Localized edema: Secondary | ICD-10-CM | POA: Diagnosis not present

## 2020-09-30 DIAGNOSIS — R296 Repeated falls: Secondary | ICD-10-CM | POA: Diagnosis not present

## 2020-09-30 DIAGNOSIS — M25562 Pain in left knee: Secondary | ICD-10-CM | POA: Diagnosis not present

## 2020-09-30 NOTE — Therapy (Addendum)
[facility] [address] [place], [place], [address] Phone: [phone]   Fax:  [phone]  Physical Therapy Treatment  Patient Details  Name: Tanner [name] MRN: [id number] Date of Birth: 1945-09-18 Referring Provider (PT): Kathalene Frames. Mayer Camel, MD  Progress Note Reporting Period 09/06/20 to 09/30/20  See note below for Objective Data and Assessment of Progress/Goals.      Encounter Date: 09/30/2020   PT End of Session - 09/30/20 1009    Visit Number 10    Date for PT Re-Evaluation 11/29/20    Authorization Type Aetna Medicare    Progress Note Due on Visit 20    PT Start Time 1008    PT Stop Time 1100    PT Time Calculation (min) 52 min    Activity Tolerance Patient tolerated treatment well    Behavior During Therapy WFL for tasks assessed/performed           Past Medical History:  Diagnosis Date  . Acute kidney failure ([place])   . Anxiety   . Arthritis   . Back pain   . BPH (benign prostatic hypertrophy)   . Clostridium difficile infection   . Clotting disorder ([place])   . Depression   . Diabetes ([place]) 12/11/2016   type 2   . DVT (deep venous thrombosis) ([place])   . DVT of deep femoral vein, right ([place]) 06/29/2018  . Edema, lower extremity   . High cholesterol   . Hypertension   . Joint pain   . Low back pain potentially associated with spinal stenosis   . Neuromuscular disorder ([place])   . Neuropathy of lower extremity    bilateral  . OSA (obstructive sleep apnea)    pt denies   . Osteoarthritis   . PE (pulmonary embolism)   . Pneumonia    hx of x 2   . Ventral hernia     Past Surgical History:  Procedure Laterality Date  . EYE SURGERY    . HERNIA REPAIR  1999  . TOTAL KNEE ARTHROPLASTY Left 08/26/2020   Procedure: LEFT TOTAL KNEE ARTHROPLASTY;  Surgeon: Frederik Pear, MD;  Location: [facility];  Service: Orthopedics;  Laterality: Left;    There were no vitals filed for this visit.   Subjective  Assessment - 09/30/20 1010    Subjective Weather is making my whole body feel stiff today.    Currently in Pain? Yes    Pain Score 3     Pain Location Knee    Pain Orientation Left    Pain Descriptors / Indicators Sore    Aggravating Factors  today weather definitely    Pain Relieving Factors Nustep, ice machine    Multiple Pain Sites No              [facility] PT Assessment - 09/30/20 0001      Observation/Other Assessments   Focus on Therapeutic Outcomes (FOTO)  59      Circumferential Edema   Circumferential - Left  50      AROM   Left Knee Extension -2    Left Knee Flexion 125      Strength   Left Knee Flexion 4+/5    Left Knee Extension 4+/5                         [facility] Adult PT Treatment/Exercise - 09/30/20 0001      Knee/Hip Exercises: Aerobic   Nustep L3 x  8 min with discussion of status      Knee/Hip Exercises: Standing   Heel Raises Both;1 set;10 reps    Hip Flexion AROM;Stengthening;Both;1 set;10 reps;Knee bent   Requires UE support for balance   Hip Abduction Stengthening;Both;1 set;10 reps      Knee/Hip Exercises: Seated   Long Arc Quad Strengthening;Left;2 sets;10 reps;Weights    Long Arc Quad Weight 5 lbs.    Hamstring Curl Strengthening;Left;10 reps;3 sets    Hamstring Limitations blue tband      Vasopneumatic   Number Minutes Vasopneumatic  15 minutes    Vasopnuematic Location  Knee    Vasopneumatic Pressure Medium    Vasopneumatic Temperature  3 flakes      Manual Therapy   Joint Mobilization patella mobs all planes    Soft tissue mobilization distal Lt quad, scar massage, retrograde massage for decreased swelling; in knee flexion for quad elongation    Passive ROM Lt knee flex/ext seated EOB on hi low table.                    PT Short Term Goals - 09/30/20 1044      PT SHORT TERM GOAL #2   Title Pt will be able to participate in 3' walk test for improved gait endurance.    Baseline 212 feet with SPC    Time 3     Period Weeks    Status Achieved      PT SHORT TERM GOAL #3   Title 5x sit to stand with light UE support as needed 15 sec or less    Baseline 18 sec signif UE support    Period Weeks    Status Achieved      PT SHORT TERM GOAL #4   Title Lt knee ROM -10 ext or better and at least 110 flexion    Period Weeks    Status Achieved   2-125 active ROM     PT SHORT TERM GOAL #5   Title Lt knee circumferential edema within 2.5 cm of Rt knee    Baseline Lt 50.5cm, Rt 46.5cm    Time 4    Period Weeks    Status On-going    Target Date 10/04/20             PT Long Term Goals - 09/30/20 1045      PT LONG TERM GOAL #2   Title Lt knee ROM -8 ext or better and at least 118 flexion    Period Weeks    Status Achieved   2-125 degrees AROM     PT LONG TERM GOAL #4   Title Pt will demo improved functional strength of LEs by demo'ing alt stair pattern using single rail and demo 5x sit to stand without UEs </= 13 sec.    Time 12    Period Weeks    Status On-going      PT LONG TERM GOAL #5   Title Pt will be able to participate in 6 min walk test with single rest as needed using SPC with min pain demo'ing improved gait endurance.    Time 12    Period Weeks    Status On-going   Will test in next 1-2 visits     PT LONG TERM GOAL #6   Title Improve FOTO  score by at least 20 points from 43% to 63% to demo improved function of Lt LE.    Time 12    Status On-going  59%                Plan - 09/30/20 1046    Clinical Impression Statement Pt arrives with mild pain and stiffness. Pt can stanid and walk to do activites 10-15 min, meeting short term goal. AROM goals met, AROM 2-125 degrees in supine. FOTO score improves since last intake, progressing towards LTG. Edema slowly reducing, see measurements in chart, progressing towards LTG slowly. 5x sit to stand met for STG, ran out of time for TUG score today.    Personal Factors and Comorbidities Age;Fitness;Comorbidity 1;Comorbidity  2;Time since onset of injury/illness/exacerbation;Comorbidity 3+    Comorbidities DM neuropathy, obesity, chronic Hx of Lt knee OA    Examination-Activity Limitations Locomotion Level;Transfers;Bed Mobility;Sleep;Squat;Stairs    Examination-Participation Restrictions Cleaning;Community Activity;Shop;Laundry;Meal Prep    Stability/Clinical Decision Making Stable/Uncomplicated    Rehab Potential Good    PT Frequency 2x / week    PT Duration 12 weeks    PT Treatment/Interventions ADLs/Self Care Home Management;Aquatic Therapy;Cryotherapy;Vasopneumatic Device;Electrical Stimulation;Moist Heat;Gait training;Stair training;Functional mobility training;Therapeutic activities;Therapeutic exercise;Neuromuscular re-education;Manual techniques;Balance training;Passive range of motion;Scar mobilization;Patient/family education;Joint Manipulations    PT Next Visit Plan 6 min walk test next session    PT Home Exercise Plan Access Code: [email or web address]    Consulted and Agree with Plan of Care Patient           Patient will benefit from skilled therapeutic intervention in order to improve the following deficits and impairments:  Abnormal gait,Decreased range of motion,Difficulty walking,Obesity,Decreased endurance,Pain,Decreased activity tolerance,Improper body mechanics,Decreased strength,Decreased mobility,Increased edema,Impaired flexibility  Visit Diagnosis: Localized edema  Chronic pain of left knee  Muscle weakness (generalized)  Repeated falls     Problem List Patient Active Problem List   Diagnosis Date Noted  . Hypertension associated with type 2 diabetes mellitus ([place]) 05/23/2020  . Chronic kidney disease (CKD), stage III (moderate) ([place]) 12/13/2019  . Carpal tunnel syndrome 12/05/2019  . Elevated creatine kinase 11/15/2019  . Aortic atherosclerosis ([place]) 11/15/2019  . Recurrent Clostridium difficile diarrhea 05/28/2017  . Peripheral neuropathy 03/17/2017  . Diabetes ([place]) 12/11/2016  .  Allergic rhinitis 12/11/2016  . Degenerative arthritis of left knee 01/30/2016  . Class 3 severe obesity with serious comorbidity and body mass index (BMI) of 45.0 to 49.9 in adult ([place]) 01/30/2016  . History of pulmonary embolus (PE) 12/19/2013  . Anxiety state 03/29/2007  . Major depressive disorder 03/29/2007  . OSA (obstructive sleep apnea) 03/29/2007  . Unspecified glaucoma 03/29/2007  . BPH (benign prostatic hyperplasia) 03/29/2007  . OA (osteoarthritis) of knee 03/29/2007  . Essential hypertension 03/29/2007  . History of Bell's palsy 03/29/2007    Artina Minella, PTA 09/30/2020, 10:50 AM  Baruch Merl, PT 10/01/20 1:56 PM   [facility] [address], [place], [place], [address] Phone: [phone]   Fax:  [phone]  Name: LAWERANCE MATSUO MRN: [id number] Date of Birth: 1946-03-20

## 2020-10-01 ENCOUNTER — Ambulatory Visit (INDEPENDENT_AMBULATORY_CARE_PROVIDER_SITE_OTHER): Payer: No Typology Code available for payment source | Admitting: Family Medicine

## 2020-10-01 ENCOUNTER — Other Ambulatory Visit: Payer: Self-pay

## 2020-10-01 ENCOUNTER — Encounter (INDEPENDENT_AMBULATORY_CARE_PROVIDER_SITE_OTHER): Payer: Self-pay | Admitting: Family Medicine

## 2020-10-01 VITALS — BP 134/82 | HR 91 | Ht 71.0 in | Wt 270.5 lb

## 2020-10-01 DIAGNOSIS — J449 Chronic obstructive pulmonary disease, unspecified: Secondary | ICD-10-CM

## 2020-10-01 DIAGNOSIS — E785 Hyperlipidemia, unspecified: Secondary | ICD-10-CM

## 2020-10-01 DIAGNOSIS — I4892 Unspecified atrial flutter: Secondary | ICD-10-CM

## 2020-10-01 DIAGNOSIS — I10 Essential (primary) hypertension: Secondary | ICD-10-CM

## 2020-10-01 DIAGNOSIS — Z9889 Other specified postprocedural states: Secondary | ICD-10-CM

## 2020-10-01 DIAGNOSIS — Z23 Encounter for immunization: Secondary | ICD-10-CM

## 2020-10-01 DIAGNOSIS — Z1211 Encounter for screening for malignant neoplasm of colon: Secondary | ICD-10-CM

## 2020-10-01 MED ORDER — SIMVASTATIN 20 MG TABLET
20.0000 mg | ORAL_TABLET | Freq: Every evening | ORAL | 3 refills | Status: DC
Start: 2020-10-01 — End: 2021-07-18

## 2020-10-01 MED ORDER — LISINOPRIL 40 MG TABLET
40.0000 mg | ORAL_TABLET | Freq: Every day | ORAL | 3 refills | Status: DC
Start: 2020-10-01 — End: 2021-09-30

## 2020-10-01 NOTE — Nursing Note (Signed)
Follow up chronic medical conditions.

## 2020-10-01 NOTE — Progress Notes (Signed)
Thomas [name] is a 75 y.o. male who presents today for a six-month follow-up of hypertension and hyperlipidemia and chronic medical problems.  The patient reports current adherence to recommended diet is good. The pt is compliant with medicine.  Regarding HTN: The following symptoms are also present: none, and he also denies the following symptoms: headache, blurred vision, chest discomfort, shortness of breath and palpitations.  Side effects of medication:none.  He does added hydralazine for his blood pressure most recently.  He is taking this 3 times a day.  He checks his blood pressure often throughout the day.  His diastolic number is sometimes 90s however usually is between 70 and 90.   Regarding lipid treatment: He is taking Zocor and is having no side effects.    Lab Results   Component Value Date    CHOLESTEROL 164 04/05/2020    HDLCHOL 53 04/05/2020    LDLCHOL 87 04/05/2020    TRIG 120 04/05/2020      He does have a history of atrial flutter and has follow-up with cardiology.  Last office visit was 01/23/2020. He hasn't scheduled a f/u. He continues on his Eliquis as his anticoagulant and it seems to be working well.  He is on a calcium channel blocker for rate control. He denies having any bleeding.  He denies any palpitations, lightheadedness, dizziness or chest pain.  he states the reason he has not followed with them is because of the high he has to pay to go to a specialist.    The patient does have a history of COPD.   He continues to take the Anoro on a regular basis.  He states that he is not having to use his rescue inhaler very often.  He denies any pulmonary symptoms today.  He is a nonsmoker.    REVIEW OF SYSTEMS:  GENERAL: negative for fevers/chills, fatigue, significant weight change, sleep disturbance  HEENT:denies visual changes, sore throat or URI symptoms  RESPIRATORY:shortness of breath as above, no cough, or wheeze  CARDIAC:  Chest pains and shortness of breath as above  GI: no  nausea/vomitting/diarrhea, no abdominal pain  MUSCULOSKELETAL: no myalgias or arthragias, no recent trauma, no edema   NEUROLOGIC: no headache, visual changes or mental status changes  GU: no urinary symptoms or CVA tenderness      No Known Allergies  Current Outpatient Medications   Medication Sig   . acetaminophen (TYLENOL) 500 mg Oral Tablet Take 500 mg by mouth Every 4 hours as needed for Pain (pt takes tylenol pm)   . ANORO ELLIPTA 62.5-25 mcg/actuation Inhalation Disk with Device inhale ONE DOSE EVERY DAY AS DIRECTED   . apixaban (ELIQUIS) 5 mg Oral Tablet Take 5 mg by mouth   . dilTIAZem (CARDIZEM CD) 240 mg Oral Capsule, Sust. Release 24 hr TAKE ONE CAPSULE BY MOUTH EVERY DAY   . flecainide (TAMBOCOR) 100 mg Oral Tablet TAKE ONE TABLET BY MOUTH TWICE DAILY   . furosemide (LASIX) 40 mg Oral Tablet TAKE ONE TABLET BY MOUTH EVERY DAY   . hydrALAZINE (APRESOLINE) 10 mg Oral Tablet Take 1 Tablet (10 mg total) by mouth Three times a day   . lisinopriL (PRINIVIL) 40 mg Oral Tablet Take 1 Tablet (40 mg total) by mouth Once a day   . melatonin 5 mg Oral Tablet Take 5 mg by mouth Every night   . nitroGLYCERIN (NITROSTAT) 0.4 mg Sublingual Tablet, Sublingual 1 Tab (0.4 mg total) by Sublingual route Every 5 minutes as needed  for Chest pain   . potassium chloride (KLOR-CON) 10 mEq Oral Tablet Sustained Release TAKE ONE TABLET BY MOUTH EVERY DAY   . simvastatin (ZOCOR) 20 mg Oral Tablet Take 1 Tablet (20 mg total) by mouth Every night   . VITAMIN E ORAL Take 800 mg by mouth     Past Medical History:   Diagnosis Date   . Arthritis    . Dysuria    . Enteritis due to Rotavirus    . Gout    . Headache    . Hemorrhoid    . Hypercholesterolemia    . Hypertension          Past Surgical History:   Procedure Laterality Date   . HX HEMORRHOIDECTOMY           Social History     Tobacco Use   . Smoking status: Former Smoker     Years: 60.00     Types: Cigarettes, Cigars   . Smokeless tobacco: Current User     Types: Chew, Snuff    Substance Use Topics   . Alcohol use: Yes        PHYSICAL EXAM:   The patient appears to be in no acute distress.  Vitals: BP 134/82   Pulse 91   Ht 1.803 m (5\' 11" )   Wt 123 kg (270 lb 8 oz)   SpO2 97%   BMI 37.73 kg/m   Respiratory: clear to ascultation B/L, no respiratory distress, equal BS throughout, no bibasilar rales  Heart: normal sinus rhythm today, no murmur, no edema, normal peripheral pulses  Abdomen: soft, nontender, positive bowel sounds  Extremities: no edema, no calf pain or tenderness  Neuro: AAOx3, cranial nerves's intact, DTR's 2/4 throughout  Skin: warm and dry, no rashes or lesions    ASSESSMENT:    2 ICD-10-CM    1. Paroxysmal atrial flutter (CMS HCC)  I48.92 Refer to [facility]-continue current medical management and follow-up with his cardiologist   2. Essential hypertension  I10 ALT (SGPT)-blood pressure well controlled.  Could not regimen.     AST (SGOT)     LIPID PANEL     BASIC METABOLIC PANEL, FASTING     CBC/DIFF   3. Hyperlipidemia  E78.5 ALT (SGPT)     AST (SGOT)     LIPID PANEL   4. COPD (chronic obstructive pulmonary disease) (CMS HCC)  J44.9 Asymptomatic.  Continue Anoro which is working well for him.  Rescue medication only as needed.   5. Screening for colon cancer  Z12.11 FECAL DNA TESTING (AMB)   6. History of cardioversion  Z98.890      PLAN:   I reviewed appropriate screenings and HCM with patient.  Immunizations are up-to-date. He was given a covid booster #2 today  Orders Placed This Encounter   . COVID-19 VACCINE,PFIZER-BIONTECH,30MCG/.3ML,BOOSTER DOSE(PURPLE TOP)   . ALT (SGPT)   . AST (SGOT)   . LIPID PANEL   . BASIC METABOLIC PANEL, FASTING   . CBC/DIFF   . FECAL DNA TESTING (AMB)   . Refer to [facility]   . lisinopriL (PRINIVIL) 40 mg Oral Tablet   . simvastatin (ZOCOR) 20 mg Oral Tablet     Medication: continue current medication regimen unchanged  Return in about 6 months (around 04/02/2021) for blood work now.

## 2020-10-02 ENCOUNTER — Encounter: Payer: Self-pay | Admitting: Physical Therapy

## 2020-10-02 ENCOUNTER — Ambulatory Visit: Payer: Medicare HMO | Admitting: Physical Therapy

## 2020-10-02 ENCOUNTER — Other Ambulatory Visit: Payer: Self-pay

## 2020-10-02 DIAGNOSIS — G8929 Other chronic pain: Secondary | ICD-10-CM | POA: Diagnosis not present

## 2020-10-02 DIAGNOSIS — M25562 Pain in left knee: Secondary | ICD-10-CM | POA: Diagnosis not present

## 2020-10-02 DIAGNOSIS — M6281 Muscle weakness (generalized): Secondary | ICD-10-CM | POA: Diagnosis not present

## 2020-10-02 DIAGNOSIS — R296 Repeated falls: Secondary | ICD-10-CM | POA: Diagnosis not present

## 2020-10-02 DIAGNOSIS — R600 Localized edema: Secondary | ICD-10-CM

## 2020-10-02 NOTE — Therapy (Signed)
[facility] [address], [place], [place], [address] Phone: [phone]   Fax:  [phone]  Physical Therapy Treatment  Patient Details  Name: Tanner [name] MRN: [id number] Date of Birth: 1946-02-11 Referring Provider (PT): Kathalene Frames. Mayer Camel, MD   Encounter Date: 10/02/2020   PT End of Session - 10/02/20 1014    Visit Number 11    Date for PT Re-Evaluation 11/29/20    Authorization Type [employer]na Medicare    Progress Note Due on Visit 20    PT Start Time 1008    PT Stop Time 1100    PT Time Calculation (min) 52 min    Activity Tolerance Patient tolerated treatment well    Behavior During Therapy WFL for tasks assessed/performed           Past Medical History:  Diagnosis Date  . Acute kidney failure ([place])   . Anxiety   . Arthritis   . Back pain   . BPH (benign prostatic hypertrophy)   . Clostridium difficile infection   . Clotting disorder ([place])   . Depression   . Diabetes ([place]) 12/11/2016   type 2   . DVT (deep venous thrombosis) ([place])   . DVT of deep femoral vein, right ([place]) 06/29/2018  . Edema, lower extremity   . High cholesterol   . Hypertension   . Joint pain   . Low back pain potentially associated with spinal stenosis   . Neuromuscular disorder ([place])   . Neuropathy of lower extremity    bilateral  . OSA (obstructive sleep apnea)    pt denies   . Osteoarthritis   . PE (pulmonary embolism)   . Pneumonia    hx of x 2   . Ventral hernia     Past Surgical History:  Procedure Laterality Date  . EYE SURGERY    . HERNIA REPAIR  1999  . TOTAL KNEE ARTHROPLASTY Left 08/26/2020   Procedure: LEFT TOTAL KNEE ARTHROPLASTY;  Surgeon: Frederik Pear, MD;  Location: [facility];  Service: Orthopedics;  Laterality: Left;    There were no vitals filed for this visit.   Subjective Assessment - 10/02/20 1012    Subjective Pt reports his hips Lt > Rt were very sore Tuesday making it hard for him to walk. Today it  is less sore, knee ok.    Currently in Pain? Yes    Pain Score 3     Pain Location Hip    Pain Orientation Left    Pain Descriptors / Indicators Sore    Aggravating Factors  Maybe those hip exercises?    Pain Relieving Factors Nustep, ice, rest    Multiple Pain Sites No                             [facility] Adult PT Treatment/Exercise - 10/02/20 0001      Lumbar Exercises: Stretches   Lower Trunk Rotation 3 reps;20 seconds      Knee/Hip Exercises: Aerobic   Nustep L3 x 8 min with discussion of status      Knee/Hip Exercises: Standing   Hip Abduction --   Held     Knee/Hip Exercises: Seated   Long Arc Quad Strengthening;Left;2 sets;10 reps;Weights    Long Arc Quad Weight 5 lbs.    Hamstring Curl Strengthening;Left;10 reps;3 sets    Hamstring Limitations black band      Vasopneumatic   Number Minutes Vasopneumatic  15 minutes    Vasopnuematic Location  Knee    Vasopneumatic Pressure Medium    Vasopneumatic Temperature  3 flakes      Manual Therapy   Joint Mobilization patella mobs all planes    Soft tissue mobilization distal Lt quad, scar massage, retrograde massage for decreased swelling; in knee flexion for quad elongation   addaday assit Lt hip/glute medius   Passive ROM Lt knee flex/ext seated EOB on hi low table.                  PT Education - 10/02/20 1052    Education Details HEP    Person(s) Educated Patient    Methods Explanation;Verbal cues;Handout    Comprehension Verbalized understanding;Returned demonstration            PT Short Term Goals - 09/30/20 1044      PT SHORT TERM GOAL #2   Title Pt will be able to participate in 3' walk test for improved gait endurance.    Baseline 212 feet with SPC    Time 3    Period Weeks    Status Achieved      PT SHORT TERM GOAL #3   Title 5x sit to stand with light UE support as needed 15 sec or less    Baseline 18 sec signif UE support    Period Weeks    Status Achieved      PT  SHORT TERM GOAL #4   Title Lt knee ROM -10 ext or better and at least 110 flexion    Period Weeks    Status Achieved   2-125 active ROM     PT SHORT TERM GOAL #5   Title Lt knee circumferential edema within 2.5 cm of Rt knee    Baseline Lt 50.5cm, Rt 46.5cm    Time 4    Period Weeks    Status On-going    Target Date 10/04/20             PT Long Term Goals - 09/30/20 1045      PT LONG TERM GOAL #2   Title Lt knee ROM -8 ext or better and at least 118 flexion    Period Weeks    Status Achieved   2-125 degrees AROM     PT LONG TERM GOAL #4   Title Pt will demo improved functional strength of LEs by demo'ing alt stair pattern using single rail and demo 5x sit to stand without UEs </= 13 sec.    Time 12    Period Weeks    Status On-going      PT LONG TERM GOAL #5   Title Pt will be able to participate in 6 min walk test with single rest as needed using SPC with min pain demo'ing improved gait endurance.    Time 12    Period Weeks    Status On-going   Will test in next 1-2 visits     PT LONG TERM GOAL #6   Title Improve FOTO  score by at least 20 points from 43% to 63% to demo improved function of Lt LE.    Time 12    Status On-going   59%                Plan - 10/02/20 1014    Clinical Impression Statement Pt arrives with reports of having a lot of Lt hip soreness on Tuesday making walking very difficult. PTa suggested it may have  been muscular soreness from Mondays hip exercises especially since today it appears less. Pt points to Lt glute medius as source of soreness. PTA held 6 min walk test today secondary to hip soreness which would skew the test. Time spent stretching and using the Addaday to assist in soft tissue mobility.    Personal Factors and Comorbidities Age;Fitness;Comorbidity 1;Comorbidity 2;Time since onset of injury/illness/exacerbation;Comorbidity 3+    Comorbidities DM neuropathy, obesity, chronic Hx of Lt knee OA    Examination-Activity  Limitations Locomotion Level;Transfers;Bed Mobility;Sleep;Squat;Stairs    Examination-Participation Restrictions Cleaning;Community Activity;Shop;Laundry;Meal Prep    Stability/Clinical Decision Making Stable/Uncomplicated    PT Frequency 2x / week    PT Duration 12 weeks    PT Treatment/Interventions ADLs/Self Care Home Management;Aquatic Therapy;Cryotherapy;Vasopneumatic Device;Electrical Stimulation;Moist Heat;Gait training;Stair training;Functional mobility training;Therapeutic activities;Therapeutic exercise;Neuromuscular re-education;Manual techniques;Balance training;Passive range of motion;Scar mobilization;Patient/family education;Joint Manipulations    PT Next Visit Plan 6 min walk test next in anticipation hip is feeling better    PT Home Exercise Plan Access Code: [email or web address]    Consulted and Agree with Plan of Care Patient           Patient will benefit from skilled therapeutic intervention in order to improve the following deficits and impairments:  Abnormal gait,Decreased range of motion,Difficulty walking,Obesity,Decreased endurance,Pain,Decreased activity tolerance,Improper body mechanics,Decreased strength,Decreased mobility,Increased edema,Impaired flexibility  Visit Diagnosis: Localized edema  Chronic pain of left knee  Muscle weakness (generalized)  Repeated falls     Problem List Patient Active Problem List   Diagnosis Date Noted  . Hypertension associated with type 2 diabetes mellitus ([place]) 05/23/2020  . Chronic kidney disease (CKD), stage III (moderate) ([place]) 12/13/2019  . Carpal tunnel syndrome 12/05/2019  . Elevated creatine kinase 11/15/2019  . Aortic atherosclerosis ([place]) 11/15/2019  . Recurrent Clostridium difficile diarrhea 05/28/2017  . Peripheral neuropathy 03/17/2017  . Diabetes ([place]) 12/11/2016  . Allergic rhinitis 12/11/2016  . Degenerative arthritis of left knee 01/30/2016  . Class 3 severe obesity with serious comorbidity and body mass index  (BMI) of 45.0 to 49.9 in adult ([place]) 01/30/2016  . History of pulmonary embolus (PE) 12/19/2013  . Anxiety state 03/29/2007  . Major depressive disorder 03/29/2007  . OSA (obstructive sleep apnea) 03/29/2007  . Unspecified glaucoma 03/29/2007  . BPH (benign prostatic hyperplasia) 03/29/2007  . OA (osteoarthritis) of knee 03/29/2007  . Essential hypertension 03/29/2007  . History of Bell's palsy 03/29/2007    Sonam Wandel, PTA 10/02/2020, 10:52 AM  [facility] [address], [place], [place], [address] Phone: [phone]   Fax:  [phone]  Name: DIONISIO ARAGONES MRN: [id number] Date of Birth: 1946-04-13

## 2020-10-07 ENCOUNTER — Other Ambulatory Visit: Payer: Self-pay

## 2020-10-07 ENCOUNTER — Encounter: Payer: Self-pay | Admitting: Physical Therapy

## 2020-10-07 ENCOUNTER — Ambulatory Visit: Payer: Medicare HMO | Admitting: Physical Therapy

## 2020-10-07 ENCOUNTER — Ambulatory Visit: Payer: Medicare HMO | Attending: Orthopedic Surgery | Admitting: Physical Therapy

## 2020-10-07 DIAGNOSIS — R296 Repeated falls: Secondary | ICD-10-CM | POA: Diagnosis not present

## 2020-10-07 DIAGNOSIS — R600 Localized edema: Secondary | ICD-10-CM | POA: Diagnosis not present

## 2020-10-07 DIAGNOSIS — G8929 Other chronic pain: Secondary | ICD-10-CM | POA: Insufficient documentation

## 2020-10-07 DIAGNOSIS — M25562 Pain in left knee: Secondary | ICD-10-CM | POA: Diagnosis not present

## 2020-10-07 DIAGNOSIS — M6281 Muscle weakness (generalized): Secondary | ICD-10-CM | POA: Diagnosis not present

## 2020-10-07 NOTE — Therapy (Signed)
[facility] [address], [place], [place], [address] Phone: [phone]   Fax:  [phone]  Physical Therapy Treatment  Patient Details  Name: Tanner [name] MRN: [id number] Date of Birth: [birth year] Referring Provider (PT): Kathalene Frames. Mayer Camel, MD   Encounter Date: 10/07/2020   PT End of Session - 10/07/20 1055    Visit Number 12    Date for PT Re-Evaluation 11/29/20    Authorization Type Aetna Medicare    Progress Note Due on Visit 20    PT Start Time 1012    PT Stop Time 1054    PT Time Calculation (min) 42 min    Activity Tolerance Patient tolerated treatment well    Behavior During Therapy WFL for tasks assessed/performed           Past Medical History:  Diagnosis Date  . Acute kidney failure ([place])   . Anxiety   . Arthritis   . Back pain   . BPH (benign prostatic hypertrophy)   . Clostridium difficile infection   . Clotting disorder ([place])   . Depression   . Diabetes ([place]) 12/11/2016   type 2   . DVT (deep venous thrombosis) ([place])   . DVT of deep femoral vein, right ([place]) 06/29/2018  . Edema, lower extremity   . High cholesterol   . Hypertension   . Joint pain   . Low back pain potentially associated with spinal stenosis   . Neuromuscular disorder ([place])   . Neuropathy of lower extremity    bilateral  . OSA (obstructive sleep apnea)    pt denies   . Osteoarthritis   . PE (pulmonary embolism)   . Pneumonia    hx of x 2   . Ventral hernia     Past Surgical History:  Procedure Laterality Date  . EYE SURGERY    . HERNIA REPAIR  1999  . TOTAL KNEE ARTHROPLASTY Left 08/26/2020   Procedure: LEFT TOTAL KNEE ARTHROPLASTY;  Surgeon: Frederik Pear, MD;  Location: [facility];  Service: Orthopedics;  Laterality: Left;    There were no vitals filed for this visit.   Subjective Assessment - 10/07/20 1018    Subjective I walk back and forth in my driveway with my cane.  I fell in my backyard last week when I  turned too fast.  I don't have knee pain anymore but feel my balance is still off.    Pertinent History Lt knee OA with anticipated TKR in March    Limitations Walking;House hold activities    How long can you walk comfortably? room to room in home    Patient Stated Goals pain relief, functional use of Lt LE, walk farther    Currently in Pain? No/denies              [facility] PT Assessment - 10/07/20 0001      Observation/Other Assessments-Edema    Edema Circumferential      Circumferential Edema   Circumferential - Right 47    Circumferential - Left  50   does not appear to be edema related differential from Rt, hardward likely     AROM   Left Knee Flexion 125      Strength   Left Knee Flexion 4+/5    Left Knee Extension 4+/5      Ambulation/Gait   Stairs Yes    Stairs Assistance 5: Supervision    Stair Management Technique One rail Right;Step to pattern  Height of Stairs 6      Standardized Balance Assessment   Standardized Balance Assessment Timed Up and Go Test;Five Times Sit to Stand    Five times sit to stand comments  14      Timed Up and Go Test   TUG Normal TUG    Normal TUG (seconds) 11    TUG Comments good stability with turns                         [facility] Adult PT Treatment/Exercise - 10/07/20 0001      Exercises   Exercises Knee/Hip      Knee/Hip Exercises: Stretches   Active Hamstring Stretch Left;2 reps;30 seconds      Knee/Hip Exercises: Aerobic   Nustep L3 x 5', shorter to save energy for 6MWT, PT present to discuss status      Knee/Hip Exercises: Standing   Gait Training 6MWT with straight cane, no pain, no need for break, no LOB with turning 180 deg      Knee/Hip Exercises: Seated   Long Arc Quad Strengthening;Left;2 sets;10 reps;Weights    Long Arc Quad Weight 5 lbs.    Long Arc Quad Limitations hold 5 sec each rep    Hamstring Curl Strengthening;3 sets;10 reps;Left    Hamstring Limitations black band                     PT Short Term Goals - 10/07/20 1024      PT SHORT TERM GOAL #5   Title Lt knee circumferential edema within 2.5 cm of Rt knee    Baseline differential is 4cm but likely due to hardward - not swollen anymore    Status Partially Met             PT Long Term Goals - 10/07/20 1044      PT LONG TERM GOAL #1   Title Pt will be able to tolerate standing and walking activities for at least 10 min with min pain    Status Achieved      PT LONG TERM GOAL #2   Title Lt knee ROM -8 ext or better and at least 118 flexion    Baseline 41/56 at eval, 50/56 07/26/20    Status Achieved      PT LONG TERM GOAL #3   Title TUG in 10 sec using SPC to demo less fall risk and improved function.    Baseline 11 sec    Status Partially Met      PT LONG TERM GOAL #4   Title Pt will demo improved functional strength of LEs by demo'ing step to stair pattern using single rail and demo 5x sit to stand without UEs </= 13 sec.    Baseline uses step to pattern for stairs, PT revised goal away from alt pattern for safety, 5x sit to stand 14 sec w/o UEs    Status Partially Met      PT LONG TERM GOAL #5   Title Pt will be able to participate in 6 min walk test with single rest as needed using SPC with min pain demo'ing improved gait endurance.    Baseline completed without pain or need for rest    Status Achieved                 Plan - 10/07/20 1057    Clinical Impression Statement Pt performed 6MWT today without difficulty or pain.  He reports  no pain in Lt knee but feels unsteady on feet secondary to previous and ongoing balance deficits.  He reports a fall in his backyard last week when turning too fast.  He no longer has edema present in Lt knee despite 3cm differential of circumference measurement which likely is hardware related.  Pt demo'd safe step to pattern with single rail today - PT revised goal away from goal of alt pattern due to history of falls.  He has a few stairs for  home access and feels ind and safe using rail + cane.  He improved his time with both 5x sit to stand and TUG today, within 1 sec of LTG for both.  Pt wonders if he might be nearing readiness for d/c.  He sees Psychologist, sport and exercise on Thursday this week and plans to discuss at that time.    Comorbidities DM neuropathy, obesity, chronic Hx of Lt knee OA    PT Frequency 2x / week    PT Duration 12 weeks    PT Treatment/Interventions ADLs/Self Care Home Management;Aquatic Therapy;Cryotherapy;Vasopneumatic Device;Electrical Stimulation;Moist Heat;Gait training;Stair training;Functional mobility training;Therapeutic activities;Therapeutic exercise;Neuromuscular re-education;Manual techniques;Balance training;Passive range of motion;Scar mobilization;Patient/family education;Joint Manipulations    PT Next Visit Plan do FOTO, pt sees MD on Thurs this week and will discuss whether to d/c at that time, may benefit from continued PT for balance work if anything further, d/c'd game ready today    PT Home Exercise Plan Access Code: [email or web address]    Consulted and Agree with Plan of Care Patient           Patient will benefit from skilled therapeutic intervention in order to improve the following deficits and impairments:     Visit Diagnosis: Chronic pain of left knee  Muscle weakness (generalized)  Repeated falls     Problem List Patient Active Problem List   Diagnosis Date Noted  . Hypertension associated with type 2 diabetes mellitus ([place]) 05/23/2020  . Chronic kidney disease (CKD), stage III (moderate) ([place]) 12/13/2019  . Carpal tunnel syndrome 12/05/2019  . Elevated creatine kinase 11/15/2019  . Aortic atherosclerosis ([place]) 11/15/2019  . Recurrent Clostridium difficile diarrhea 05/28/2017  . Peripheral neuropathy 03/17/2017  . Diabetes ([place]) 12/11/2016  . Allergic rhinitis 12/11/2016  . Degenerative arthritis of left knee 01/30/2016  . Class 3 severe obesity with serious comorbidity and body mass index  (BMI) of 45.0 to 49.9 in adult ([place]) 01/30/2016  . History of pulmonary embolus (PE) 12/19/2013  . Anxiety state 03/29/2007  . Major depressive disorder 03/29/2007  . OSA (obstructive sleep apnea) 03/29/2007  . Unspecified glaucoma 03/29/2007  . BPH (benign prostatic hyperplasia) 03/29/2007  . OA (osteoarthritis) of knee 03/29/2007  . Essential hypertension 03/29/2007  . History of Bell's palsy 03/29/2007    Alene Mires Broadus 10/07/2020, 11:01 AM  [facility] [address], [place], [place], [address] Phone: [phone]   Fax:  [phone]  Name: Tanner [name] MRN: [id number] Date of Birth: 07-20-1945

## 2020-10-09 ENCOUNTER — Other Ambulatory Visit: Payer: Self-pay

## 2020-10-09 ENCOUNTER — Encounter: Payer: Self-pay | Admitting: Physical Therapy

## 2020-10-09 ENCOUNTER — Ambulatory Visit: Payer: Medicare HMO | Admitting: Physical Therapy

## 2020-10-09 DIAGNOSIS — R296 Repeated falls: Secondary | ICD-10-CM | POA: Diagnosis not present

## 2020-10-09 DIAGNOSIS — M6281 Muscle weakness (generalized): Secondary | ICD-10-CM | POA: Diagnosis not present

## 2020-10-09 DIAGNOSIS — G8929 Other chronic pain: Secondary | ICD-10-CM | POA: Diagnosis not present

## 2020-10-09 DIAGNOSIS — R600 Localized edema: Secondary | ICD-10-CM | POA: Diagnosis not present

## 2020-10-09 DIAGNOSIS — M25562 Pain in left knee: Secondary | ICD-10-CM | POA: Diagnosis not present

## 2020-10-09 NOTE — Therapy (Addendum)
[facility] [address] [place], [place], [address] Phone: [phone]   Fax:  [phone]  Physical Therapy Treatment  Patient Details  Name: Tanner [name] MRN: [id number] Date of Birth: March 24, 1946 Referring Provider (PT): Kathalene Frames. Mayer Camel, MD   Encounter Date: 10/09/2020   PT End of Session - 10/09/20 1013    Visit Number 13    Date for PT Re-Evaluation 11/29/20    Authorization Type [employer]    Progress Note Due on Visit 20    PT Start Time 1006    PT Stop Time 1044    PT Time Calculation (min) 38 min    Activity Tolerance Patient tolerated treatment well    Behavior During Therapy WFL for tasks assessed/performed           Past Medical History:  Diagnosis Date  . Acute kidney failure ([place])   . Anxiety   . Arthritis   . Back pain   . BPH (benign prostatic hypertrophy)   . Clostridium difficile infection   . Clotting disorder ([place])   . Depression   . Diabetes ([place]) 12/11/2016   type 2   . DVT (deep venous thrombosis) ([place])   . DVT of deep femoral vein, right ([place]) 06/29/2018  . Edema, lower extremity   . High cholesterol   . Hypertension   . Joint pain   . Low back pain potentially associated with spinal stenosis   . Neuromuscular disorder ([place])   . Neuropathy of lower extremity    bilateral  . OSA (obstructive sleep apnea)    pt denies   . Osteoarthritis   . PE (pulmonary embolism)   . Pneumonia    hx of x 2   . Ventral hernia     Past Surgical History:  Procedure Laterality Date  . EYE SURGERY    . HERNIA REPAIR  1999  . TOTAL KNEE ARTHROPLASTY Left 08/26/2020   Procedure: LEFT TOTAL KNEE ARTHROPLASTY;  Surgeon: Frederik Pear, MD;  Location: [facility];  Service: Orthopedics;  Laterality: Left;    There were no vitals filed for this visit.   Subjective Assessment - 10/09/20 1014    Subjective I see the doctor tomorrow    Currently in Pain? Yes    Pain Score 2     Pain Location  Knee    Pain Orientation Left    Pain Descriptors / Indicators Dull;Sore    Aggravating Factors  nothing noted today    Pain Relieving Factors Nustep, ice, rest    Multiple Pain Sites No              [facility] PT Assessment - 10/09/20 0001      Assessment   Medical Diagnosis Lt TKR    Referring Provider (PT) Kathalene Frames. Mayer Camel, MD    Next MD Visit 10/10/20      Precautions   Precautions Fall      Observation/Other Assessments   Focus on Therapeutic Outcomes (FOTO)  66      Circumferential Edema   Circumferential - Right 47    Circumferential - Left  50   does not appear to be edema related differential from Rt, hardward likely     Functional Tests   Functional tests Sit to Stand      Sit to Stand   Comments ind with UE support      AROM   Left Knee Extension -2    Left Knee Flexion 125  Strength   Left Knee Flexion 4+/5    Left Knee Extension 4+/5      Ambulation/Gait   Stairs Yes    Stairs Assistance 5: Supervision    Stair Management Technique One rail Right;Step to pattern    Height of Stairs 6      Standardized Balance Assessment   Standardized Balance Assessment Timed Up and Go Test;Five Times Sit to Stand    Five times sit to stand comments  14      Timed Up and Go Test   TUG Normal TUG    Normal TUG (seconds) 11    TUG Comments good stability with turns                         [facility] Adult PT Treatment/Exercise - 10/09/20 0001      Knee/Hip Exercises: Aerobic   Nustep L3 x 10 min Tanner [name] present to discuss status      Knee/Hip Exercises: Seated   Long Arc Quad Strengthening;Left;2 sets;10 reps;Weights    Long Arc Quad Weight 5 lbs.    Ball Squeeze 20x    Clamshell with TheraBand --   Black band 2x10   Hamstring Curl Strengthening;3 sets;10 reps;Left    Hamstring Limitations black band                    PT Short Term Goals - 10/07/20 1024      PT SHORT TERM GOAL #5   Title Lt knee circumferential edema within 2.5 cm of  Rt knee    Baseline differential is 4cm but likely due to hardward - not swollen anymore    Status Partially Met             PT Long Term Goals - 10/09/20 1037      PT LONG TERM GOAL #1   Title Pt will be able to tolerate standing and walking activities for at least 10 min with min pain    Time 8    Period Weeks    Status Achieved      PT LONG TERM GOAL #2   Title Lt knee ROM -8 ext or better and at least 118 flexion    Period Weeks    Status Achieved      PT LONG TERM GOAL #3   Title TUG in 10 sec using SPC to demo less fall risk and improved function.    Baseline 11 sec    Period Weeks    Status Partially Met      PT LONG TERM GOAL #4   Title Pt will demo improved functional strength of LEs by demo'ing step to stair pattern using single rail and demo 5x sit to stand without UEs </= 13 sec.    Baseline uses step to pattern for stairs, PT revised goal away from alt pattern for safety, 5x sit to stand 14 sec w/o UEs    Time 12    Period Weeks    Status Partially Met      PT LONG TERM GOAL #5   Title Pt will be able to participate in 6 min walk test with single rest as needed using SPC with min pain demo'ing improved gait endurance.    Baseline completed without pain or need for rest    Time 12    Period Weeks    Status Achieved      PT LONG TERM GOAL #6   Title Improve FOTO  score by at least 20 points from 43% to 63% to demo improved function of Lt LE.    Baseline 43%    Time 12    Period Weeks    Status --   66                Plan - 10/09/20 1045    Clinical Impression Statement Pt partially met all long term goals including ROM, walking, and FOTO goals. He presents with no pain and reports his knee pain is like 80%-90% better. He plans on joining the [employer] to conitnue with his exercises if he discharges from therapy.    Personal Factors and Comorbidities Age;Fitness;Comorbidity 1;Comorbidity 2;Time since onset of injury/illness/exacerbation;Comorbidity 3+     Comorbidities DM neuropathy, obesity, chronic Hx of Lt knee OA    Examination-Activity Limitations Locomotion Level;Transfers;Bed Mobility;Sleep;Squat;Stairs    Examination-Participation Restrictions Cleaning;Community Activity;Shop;Laundry;Meal Prep    Stability/Clinical Decision Making Stable/Uncomplicated    Rehab Potential Good    PT Frequency 2x / week    PT Duration 12 weeks    PT Treatment/Interventions ADLs/Self Care Home Management;Aquatic Therapy;Cryotherapy;Vasopneumatic Device;Electrical Stimulation;Moist Heat;Gait training;Stair training;Functional mobility training;Therapeutic activities;Therapeutic exercise;Neuromuscular re-education;Manual techniques;Balance training;Passive range of motion;Scar mobilization;Patient/family education;Joint Manipulations    PT Next Visit Plan Pt will see MD tomorrow. Plans to call our office to let us know if he plans on finishing his course of care out.    PT Home Exercise Plan Access Code: [email or web address]    Consulted and Agree with Plan of Care Patient           Patient will benefit from skilled therapeutic intervention in order to improve the following deficits and impairments:  Abnormal gait,Decreased range of motion,Difficulty walking,Obesity,Decreased endurance,Pain,Decreased activity tolerance,Improper body mechanics,Decreased strength,Decreased mobility,Increased edema,Impaired flexibility  Visit Diagnosis: Chronic pain of left knee  Muscle weakness (generalized)  Repeated falls  Localized edema     Problem List Patient Active Problem List   Diagnosis Date Noted  . Hypertension associated with type 2 diabetes mellitus ([place]) 05/23/2020  . Chronic kidney disease (CKD), stage III (moderate) ([place]) 12/13/2019  . Carpal tunnel syndrome 12/05/2019  . Elevated creatine kinase 11/15/2019  . Aortic atherosclerosis ([place]) 11/15/2019  . Recurrent Clostridium difficile diarrhea 05/28/2017  . Peripheral neuropathy 03/17/2017  . Diabetes  ([place]) 12/11/2016  . Allergic rhinitis 12/11/2016  . Degenerative arthritis of left knee 01/30/2016  . Class 3 severe obesity with serious comorbidity and body mass index (BMI) of 45.0 to 49.9 in adult ([place]) 01/30/2016  . History of pulmonary embolus (PE) 12/19/2013  . Anxiety state 03/29/2007  . Major depressive disorder 03/29/2007  . OSA (obstructive sleep apnea) 03/29/2007  . Unspecified glaucoma 03/29/2007  . BPH (benign prostatic hyperplasia) 03/29/2007  . OA (osteoarthritis) of knee 03/29/2007  . Essential hypertension 03/29/2007  . History of Bell's palsy 03/29/2007    Tanner [name], Tanner [name] 10/09/2020, 10:49 AM  PHYSICAL THERAPY DISCHARGE SUMMARY  Visits from Start of Care: 13  Current functional level related to goals / functional outcomes: See above   Remaining deficits: See above   Education / Equipment: HEP Plan: Patient agrees to discharge.  Patient goals were partially met. Patient is being discharged due to being pleased with the current functional level.  ?????      Baruch Merl, PT 10/11/20 8:25 AM   [facility] [address], [place], [place], [address] Phone: [phone]   Fax:  [phone]  Name: Tanner [name] MRN: [id number] Date of Birth:  08/22/1945   

## 2020-10-11 ENCOUNTER — Ambulatory Visit (INDEPENDENT_AMBULATORY_CARE_PROVIDER_SITE_OTHER): Payer: Self-pay | Admitting: Family Medicine

## 2020-10-11 ENCOUNTER — Telehealth (INDEPENDENT_AMBULATORY_CARE_PROVIDER_SITE_OTHER): Payer: Self-pay | Admitting: Family Medicine

## 2020-10-11 NOTE — Telephone Encounter (Signed)
Regarding: Dr Rhoderick Moody, [employer] form question  ----- Message from Rinaldo Ratel sent at 10/11/2020  9:13 AM EDT -----  Rhoderick Moody, DO    The patient called asking about the status of the [employer], form for this medication:    apixaban (ELIQUIS) 5 mg Oral Tablet   10/26/2017    Sig - Route: Take 5 mg by mouth - Oral   Class: Historical Med    Thank you,  Rinaldo Ratel

## 2020-10-11 NOTE — Telephone Encounter (Signed)
FORMS COMPLETED AND FAXED , PT AWARE

## 2020-10-11 NOTE — Telephone Encounter (Signed)
Form back in in basket, sent this message to see if she could complete this since its for Eliquis, there is one scanned into chart so if signed we can complete

## 2020-10-11 NOTE — Telephone Encounter (Signed)
See scan, pt asking about sleep meds and weight loss meds

## 2020-10-14 ENCOUNTER — Other Ambulatory Visit: Payer: Self-pay

## 2020-10-14 ENCOUNTER — Ambulatory Visit (INDEPENDENT_AMBULATORY_CARE_PROVIDER_SITE_OTHER): Payer: Medicare HMO | Admitting: Pharmacist

## 2020-10-14 DIAGNOSIS — Z86711 Personal history of pulmonary embolism: Secondary | ICD-10-CM

## 2020-10-14 DIAGNOSIS — R69 Illness, unspecified: Secondary | ICD-10-CM | POA: Diagnosis not present

## 2020-10-14 DIAGNOSIS — E1169 Type 2 diabetes mellitus with other specified complication: Secondary | ICD-10-CM | POA: Diagnosis not present

## 2020-10-14 DIAGNOSIS — I10 Essential (primary) hypertension: Secondary | ICD-10-CM

## 2020-10-14 DIAGNOSIS — I2510 Atherosclerotic heart disease of native coronary artery without angina pectoris: Secondary | ICD-10-CM

## 2020-10-14 DIAGNOSIS — F325 Major depressive disorder, single episode, in full remission: Secondary | ICD-10-CM | POA: Diagnosis not present

## 2020-10-14 DIAGNOSIS — N1831 Chronic kidney disease, stage 3a: Secondary | ICD-10-CM | POA: Diagnosis not present

## 2020-10-14 DIAGNOSIS — N400 Enlarged prostate without lower urinary tract symptoms: Secondary | ICD-10-CM | POA: Diagnosis not present

## 2020-10-14 DIAGNOSIS — R9430 Abnormal result of cardiovascular function study, unspecified: Secondary | ICD-10-CM | POA: Insufficient documentation

## 2020-10-14 MED ORDER — TRAZODONE 50 MG TABLET
50.0000 mg | ORAL_TABLET | Freq: Every evening | ORAL | 3 refills | Status: DC
Start: 2020-10-14 — End: 2021-01-24

## 2020-10-14 NOTE — Progress Notes (Signed)
Chronic Care Management Pharmacy Note  10/14/2020 Name:  Tanner [name] MRN:  [id number] DOB:  [birth year]  Subjective: Tanner [name] is an 75 y.o. year old male who is a primary patient of Hoyt Koch, MD.  The CCM team was consulted for assistance with disease management and care coordination needs.    Engaged with patient by telephone for follow up visit in response to provider referral for pharmacy case management and/or care coordination services.   Consent to Services:  The patient was given information about Chronic Care Management services, agreed to services, and gave verbal consent prior to initiation of services.  Please see initial visit note for detailed documentation.   Patient Care Team: Hoyt Koch, MD as PCP - General (Internal Medicine) Charlton Haws, [facility] as Pharmacist (Pharmacist)  Recent office visits: 12/12/19 Dr Sharlet Salina OV: chronic f/u. Previously taken off metformin for rising creatinine.   Recent consult visits: PT for knee @ [facility].  04/04/20 Dr [name] (weight mgmt): 34 lb wt loss since 10/12/19. Referred to PT.  03/27/20 Dr Tamala Julian (sports med): knee injection given.  02/21/20 Dr [name] (weight mgmt): switched Trulicity to Ozempic due to donut hole.  Hospital visits: 08/26/20 admission for knee replacement  Objective:  Lab Results  Component Value Date   CREATININE 1.48 (H) 08/20/2020   BUN 19 08/20/2020   GFR 48.19 (L) 12/12/2019   GFRNONAA 49 (L) 08/20/2020   GFRAA 55 (L) 01/23/2020   NA 140 08/20/2020   K 3.8 08/20/2020   CALCIUM 9.7 08/20/2020   CO2 29 08/20/2020   GLUCOSE 89 08/20/2020    Lab Results  Component Value Date/Time   HGBA1C 5.1 08/20/2020 01:26 PM   HGBA1C 5.4 04/15/2020 12:00 AM   HGBA1C 8.1 (H) 10/12/2019 02:59 PM   GFR 48.19 (L) 12/12/2019 02:43 PM   GFR 63.06 11/29/2017 01:32 PM    Last diabetic Eye exam: No results found for: HMDIABEYEEXA  Last diabetic Foot exam: No  results found for: HMDIABFOOTEX   Lab Results  Component Value Date   CHOL 85 04/15/2020   HDL 33 (A) 04/15/2020   LDLCALC 43 04/15/2020   LDLDIRECT 65.9 09/13/2007   TRIG 122 04/15/2020   CHOLHDL 3.6 10/12/2019    Hepatic Function Latest Ref Rng & Units 07/04/2020 04/15/2020 01/23/2020  Total Protein 6.5 - 8.1 g/dL 6.5 - 5.9(L)  Albumin 3.5 - 5.0 g/dL 4.0 - 4.1  AST 15 - 41 U/L 28 13(A) 16  ALT 0 - 44 U/L $Remo'26 24 15  'CWcvs$ Alk Phosphatase 38 - 126 U/L 91 - 103  Total Bilirubin 0.3 - 1.2 mg/dL 0.5 - 0.7  Bilirubin, Direct 0.0 - 0.3 mg/dL - - -    Lab Results  Component Value Date/Time   TSH 3.050 10/12/2019 02:59 PM   TSH 2.28 09/13/2007 10:28 AM   FREET4 1.35 10/12/2019 02:59 PM    CBC Latest Ref Rng & Units 08/20/2020 07/04/2020 01/02/2020  WBC 4.0 - 10.5 K/uL 8.8 8.3 11.7(H)  Hemoglobin 13.0 - 17.0 g/dL 15.5 14.6 15.2  Hematocrit 39.0 - 52.0 % 48.8 46.1 47.8  Platelets 150 - 400 K/uL 174 199 195    Lab Results  Component Value Date/Time   VD25OH 51 04/15/2020 12:00 AM   VD25OH 32.6 10/12/2019 02:59 PM   VD25OH 29.47 (L) 11/29/2017 01:32 PM    Clinical ASCVD: Yes  The ASCVD Risk score Mikey Bussing DC Jr., et al., 2013) failed to calculate for the following reasons:  The valid total cholesterol range is 130 to 320 mg/dL    Depression screen [facility] 2/9 10/12/2019 09/24/2016 04/19/2015  Decreased Interest 3 0 0  Down, Depressed, Hopeless 3 0 0  PHQ - 2 Score 6 0 0  Altered sleeping 0 - -  Tired, decreased energy 3 - -  Change in appetite 1 - -  Feeling bad or failure about yourself  1 - -  Trouble concentrating 1 - -  Moving slowly or fidgety/restless 0 - -  Suicidal thoughts 0 - -  PHQ-9 Score 12 - -  Difficult doing work/chores Not difficult at all - -  Some recent data might be hidden     Social History   Tobacco Use  Smoking Status Former Smoker  . Packs/day: 2.00  . Years: 33.00  . Pack years: 66.00  . Types: Cigarettes  . Start date: 08/30/1968  . Quit date: 07/02/2001   . Years since quitting: 19.2  Smokeless Tobacco Never Used  Tobacco Comment   quit 12 years ago   BP Readings from Last 3 Encounters:  08/26/20 132/80  08/20/20 (!) 124/58  07/24/20 127/70   Pulse Readings from Last 3 Encounters:  08/26/20 86  08/20/20 70  07/24/20 65   Wt Readings from Last 3 Encounters:  08/26/20 242 lb (109.8 kg)  07/24/20 245 lb (111.1 kg)  07/23/20 253 lb (114.8 kg)   BMI Readings from Last 3 Encounters:  08/26/20 39.66 kg/m  07/24/20 40.15 kg/m  07/23/20 42.10 kg/m    Assessment/Interventions: Review of patient past medical history, allergies, medications, health status, including review of consultants reports, laboratory and other test data, was performed as part of comprehensive evaluation and provision of chronic care management services.   SDOH:  (Social Determinants of Health) assessments and interventions performed: Yes  SDOH Screenings   Alcohol Screen: Not on file  Depression (WCH8-5): Not on file  Financial Resource Strain: Low Risk   . Difficulty of Paying Living Expenses: Not very hard  Food Insecurity: Not on file  Housing: Not on file  Physical Activity: Not on file  Social Connections: Not on file  Stress: Not on file  Tobacco Use: Medium Risk  . Smoking Tobacco Use: Former Smoker  . Smokeless Tobacco Use: Never Used  Transportation Needs: Not on file    CCM Care Plan  Allergies  Allergen Reactions  . Lisinopril     Other reaction(s): Renal impairment  . Amlodipine Swelling  . Codeine Sulfate Itching and Nausea Only    Medications Reviewed Today    Reviewed by Charlton Haws, [facility] (Pharmacist) on 10/14/20 at 1355  Med List Status: <None>  Medication Order Taking? Sig Documenting Provider Last Dose Status Informant  acetaminophen (TYLENOL) 500 MG tablet [id number] Yes Take 1,000 mg by mouth every 6 (six) hours as needed for moderate pain. Norins, Heinz Knuckles, MD Taking Active Self  acidophilus (RISAQUAD) CAPS  capsule [id number] Yes Take 1 capsule by mouth daily. [provider] Taking Active Self  apixaban (ELIQUIS) 5 MG TABS tablet [id number] Yes Take 5 mg by mouth 2 (two) times daily. [provider] Taking Active   atenolol (TENORMIN) 25 MG tablet [id number] Yes Take 12.5 mg by mouth daily. [provider] Taking Active Self  atorvastatin (LIPITOR) 80 MG tablet [id number] Yes Take 40 mg by mouth daily. [provider] Taking Active Self  buPROPion (WELLBUTRIN SR) 150 MG 12 hr tablet [id number] Yes Take 150 mg by mouth 2 (two) times daily. [provider] Taking Active Self  finasteride (PROSCAR) 5 MG tablet [id number] Yes Take 5 mg by mouth at bedtime. [provider] Taking Active Self  furosemide (LASIX) 20 MG tablet [id number] Yes Take 2 tablets (40 mg total) by mouth 2 (two) times daily.  Patient taking differently: Take 40 mg by mouth daily.   Briscoe Deutscher, DO Taking Active Self  gabapentin (NEURONTIN) 300 MG capsule [id number] Yes Take 300-600 mg by mouth See admin instructions. Take 600 mg by mouth three times daily and  300 mg at night [provider] Taking Active Self  ketoconazole (NIZORAL) 2 % shampoo [id number] Yes Apply 1 application topically every other day. [provider] Taking Active Self  Menthol-Methyl Salicylate (THERA-GESIC PLUS) CREA [id number] Yes Apply 1 application topically at bedtime. Apply to lower back [provider] Taking Active Self  Multiple Vitamins-Minerals (MULTIVITAMINS THER. W/MINERALS) Sheral Flow [id number] Yes Take 1 tablet by mouth daily. [provider] Taking Active Self  neomycin-polymyxin-hydrocortisone (CORTISPORIN) 3.5-10000-1 OTIC suspension [id number] Yes Place 2 drops into both ears daily. [provider] Taking Active Self  nortriptyline (PAMELOR) 10 MG capsule [id number] Yes 2 CAPSULES EVERY NIGHT AT BEDTIME  Patient taking differently: Take 20 mg by mouth at bedtime.    Alda Berthold, DO Taking Active Self  potassium chloride (KLOR-CON) 10 MEQ tablet [id number] Yes Take 4 tablets (40 mEq total) by mouth 2 (two) times daily.  Patient taking differently: Take 20 mEq by mouth daily.   Briscoe Deutscher, DO Taking Active Self  saccharomyces boulardii (FLORASTOR) 250 MG capsule [id number] Yes Take 1 capsule (250 mg total) by mouth 2 (two) times daily. Lavina Hamman, MD Taking Active Self  Semaglutide,0.25 or 0.5MG /DOS, (OZEMPIC, 0.25 OR 0.5 MG/DOSE,) 2 MG/1.5ML SOPN [id number] Yes Inject 0.5 mg into the skin once a week.  Patient taking differently: Inject 0.5 mg into the skin once a week. PT TAKES ON Wednesday   Hoyt Koch, MD Taking Active   Med List Note Leslye Peer, CPhT 08/21/11 1042): [place]          Patient Active Problem List   Diagnosis Date Noted  . Hypertension associated with type 2 diabetes mellitus ([place]) 05/23/2020  . Chronic kidney disease (CKD), stage III (moderate) ([place]) 12/13/2019  . Carpal tunnel syndrome 12/05/2019  . Elevated creatine kinase 11/15/2019  . Aortic atherosclerosis ([place]) 11/15/2019  . Recurrent Clostridium difficile diarrhea 05/28/2017  . Peripheral neuropathy 03/17/2017  . Diabetes ([place]) 12/11/2016  . Allergic rhinitis 12/11/2016  . Degenerative arthritis of left knee 01/30/2016  . Class 3 severe obesity with serious comorbidity and body mass index (BMI) of 45.0 to 49.9 in adult ([place]) 01/30/2016  . History of pulmonary embolus (PE) 12/19/2013  . Anxiety state 03/29/2007  . Major depressive disorder 03/29/2007  . OSA (obstructive sleep apnea) 03/29/2007  . Unspecified glaucoma 03/29/2007  . BPH (benign prostatic hyperplasia) 03/29/2007  . OA (osteoarthritis) of knee 03/29/2007  . Essential hypertension 03/29/2007  . History of Bell's palsy 03/29/2007    Immunization History  Administered Date(s) Administered  . Fluad Quad(high Dose 65+) 02/22/2019, 04/15/2020  . H1N1 05/25/2008  . Influenza,  High Dose Seasonal PF 02/26/2016, 03/17/2017, 02/28/2018  . Influenza, Seasonal, Injecte, Preservative Fre 04/02/2009, 04/03/2010, 03/24/2013, 04/06/2014  . Influenza,inj,Quad PF,6+ Mos 03/15/2015  . Influenza-Unspecified 02/16/2008, 03/11/2011, 05/06/2012, 02/14/2015, 03/31/2016, 03/15/2017, 02/22/2019  . Moderna Sars-Covid-2 Vaccination 07/31/2019, 08/29/2019, 05/07/2020  . Pneumococcal Conjugate-13 09/11/2013, 03/15/2015  . Pneumococcal Polysaccharide-23 04/03/2010  . Td 04/15/2020  .  Tdap 11/27/2008  . Zoster 02/05/2010    Conditions to be addressed/monitored:  Hypertension, Hyperlipidemia, Diabetes, Coronary Artery Disease, Depression and BPH   Care Plan : CCM Pharmacy Care Plan  Updates made by Charlton Haws, [place] since 10/14/2020 12:00 AM    Problem: Hypertension, Hyperlipidemia, Diabetes, Coronary Artery Disease, Depression and BPH   Priority: High    Long-Range Goal: Disease management   Start Date: 10/14/2020  Expected End Date: 10/14/2021  This Visit's Progress: On track  Priority: High  Note:   Current Barriers:  . Unable to independently monitor therapeutic efficacy  Pharmacist Clinical Goal(s):  Marland Kitchen Patient will achieve adherence to monitoring guidelines and medication adherence to achieve therapeutic efficacy through collaboration with PharmD and provider.   Interventions: . 1:1 collaboration with Hoyt Koch, MD regarding development and update of comprehensive plan of care as evidenced by provider attestation and co-signature . Inter-disciplinary care team collaboration (see longitudinal plan of care) . Comprehensive medication review performed; medication list updated in electronic medical record  Hypertension / CKD 3a    BP goal is:  <130/80 Patient checks BP at home when feeling symptomatic Patient home BP readings are ranging: 120s-130s/60s   Patient has failed these meds in the past: HCTZ Patient is currently controlled on the following  medications:   Atenolol 25 mg - 1/2 tab daily AM   Furosemide 20 mg - 2 tab daily (pt takes less than prescribed)  Klor-Con 10 mEq - 2 tab daily   We discussed: pt's regimen is not ideal given CKD; pt has been on atenolol for over 10 years via [place] provider; discussed pt may benefit from ACE/ARB for kidney protection given fluctuating kidney function (GFR 38-50 over past year); pt does not want to make changes today but may discuss optimizing BP regimen with [facility] provider   Plan Recommend switching atenolol to lisinopril 5 mg for additional kidney protection benefits - pt to discuss with [facility] provider   Hyperlipidemia / CAD    LDL goal < 70 Hx CAD   04/15/20 labs at [facility] (per patient report) Chol 86 TRIG 122 HDL 33 Non-HDL 52  Patient has failed these meds in past: n/a Patient is currently controlled on the following medications:   Atorvastatin 80 mg - 1/2 tab daily HS   We discussed:  diet and exercise extensively; Cholesterol goals; benefits of statin for ASCVD risk reduction; cholesterol has improved since pt has lost >30 lbs   Plan: Continue current medications and control with diet and exercise   Diabetes    A1c goal <7% A1c 5.4% Nov 1st at [facility] (per pt report) Checking BG: Never    Patient has failed these meds in past: Trulicity, metformin Patient is currently controlled on the following medications:  Ozempic 0.5 mg weekly ($47/month)   We discussed: excellent A1c control per patient report after >30 lbs wt loss; pt denies hypoglycemia; discussed BG monitoring - pt does not check BG at home, given A1c control and lack of hypoglycemia-causing agents, pt does not need to check BG regularly at home; pt gets [facility]empic through [facility] since it is not covered by the [place]   Plan: Continue current medications and control with diet and exercise   Depression   Patient has failed these meds in past: n/a Patient is currently controlled on the following medications:   Bupropion SR 150 mg  BID  Nortriptyline 10 mg - 2 cap HS   We discussed: Patient is satisfied with current regimen and  denies issues; he reports nortriptyline is not for mood  Plan: Continue current medications   Pain    Peripheral neuropathy Degenerative osteoarthritis of knee   Patient has failed these meds in past: tramadol Patient is currently controlled on the following medications:   Gabapentin 300 mg - 2 AM, 2 lunch, 2 PM, 1 HS  Nortriptyline 10 mg - 2 cap HS  Tylenol 500 mg PRN   We discussed:  Pt reports taking nortriptyline for neuropathy (not mood); Patient is satisfied with current regimen and denies issues   Plan: Continue current medications   Hx PE    Patient has failed these meds in past: n/a Patient is currently controlled on the following medications:   Eliquis 5 mg BID   We discussed: lifelong indication for Eliquis; bleeding risk; pt endorses occasional bruising and nuisance bleeding; denies major bleeding -pt was on Eliquis 2.5 mg for a month after his knee surgery, he is now back on full dose given hx of PE   Plan: Continue current medications   BPH     Patient has failed these meds in past: tamsulosin? Patient is currently controlled on the following medications:   Finasteride 5 mg HS   We discussed:  Pt reports side effects he attributes to finasteride - erectile dysfunction mainly; he has not discussed this with prescriber before; he thinks he has taken Flomax before; a [facility] provider prescribes his BPH medications; advised he discuss optimizing his BPH treatment to minimize side effects   Plan Continue current medications  Pt to discuss therapy options with [facility] provider  Patient Goals/Self-Care Activities . Patient will:  - take medications as prescribed focus on medication adherence by pill box check blood pressure daily, document, and provide at future appointments -Discuss optimizing blood pressure medications for kidney disease with [facility] provider -Discuss  optimizing prostate medications with [facility] provider  Follow Up Plan: Telephone follow up appointment with care management team member scheduled for: 6 months      Medication Assistance: None required.  Patient affirms current coverage meets needs.  Patient's preferred pharmacy is:  [id number] - [place], [address] [place] [address] Phone: [phone] Fax: [phone]  Uses pill box? Yes Pt endorses 100% compliance  We discussed: Current pharmacy is preferred with insurance plan and patient is satisfied with pharmacy services Patient decided to: Continue current medication management strategy  Care Plan and Follow Up Patient Decision:  Patient agrees to Care Plan and Follow-up.  Plan: Telephone follow up appointment with care management team member scheduled for:  6 months  Charlene Brooke, PharmD, [place], CPP Clinical Pharmacist [facility] [phone]

## 2020-10-14 NOTE — Patient Instructions (Addendum)
Visit Information  Phone number for Pharmacist: [phone]  Goals Addressed            This Visit's Progress   . Manage My Medicine       Timeframe:  Long-Range Goal Priority:  Medium Start Date:       10/14/20                      Expected End Date: 10/14/21                      Follow Up Date 02/11/21   - call for medicine refill 2 or 3 days before it runs out - call if I am sick and can't take my medicine - keep a list of all the medicines I take; vitamins and herbals too - use a pillbox to sort medicine  -Discuss optimizing blood pressure medications for kidney disease with [facility] provider -Discuss optimizing prostate medications with [facility] provider   Why is this important?   . These steps will help you keep on track with your medicines.   Notes:       Patient verbalizes understanding of instructions provided today and agrees to view in [place].  Telephone follow up appointment with pharmacy team member scheduled for: 6 months  Charlene Brooke, PharmD, [place], CPP Clinical Pharmacist [facility] [phone]

## 2020-10-14 NOTE — Telephone Encounter (Signed)
Patient aware and voiced understanding

## 2020-10-14 NOTE — Telephone Encounter (Signed)
Okay.  Trazodone sent to pharmacy.  He can start with just a half a tablet if he would like to start slowly.  If it is not helping at a whole tablet we are able to increase the dose but he would have to know.  But there is no safe medication to use for weight loss in someone his his age with so many medical problems. Recommend low fat/low calorie diet.  He can be referred to a dietitian if his insurance covers that.

## 2020-10-18 ENCOUNTER — Ambulatory Visit: Payer: No Typology Code available for payment source | Attending: Family Medicine | Admitting: Family

## 2020-10-18 ENCOUNTER — Other Ambulatory Visit: Payer: Self-pay

## 2020-10-18 ENCOUNTER — Encounter (INDEPENDENT_AMBULATORY_CARE_PROVIDER_SITE_OTHER): Payer: Self-pay | Admitting: Family

## 2020-10-18 VITALS — BP 134/78 | HR 104 | Ht 71.0 in | Wt 272.0 lb

## 2020-10-18 DIAGNOSIS — E669 Obesity, unspecified: Secondary | ICD-10-CM | POA: Insufficient documentation

## 2020-10-18 DIAGNOSIS — I10 Essential (primary) hypertension: Secondary | ICD-10-CM | POA: Insufficient documentation

## 2020-10-18 DIAGNOSIS — E782 Mixed hyperlipidemia: Secondary | ICD-10-CM | POA: Insufficient documentation

## 2020-10-18 DIAGNOSIS — R9431 Abnormal electrocardiogram [ECG] [EKG]: Secondary | ICD-10-CM

## 2020-10-18 DIAGNOSIS — I4892 Unspecified atrial flutter: Secondary | ICD-10-CM | POA: Insufficient documentation

## 2020-10-18 DIAGNOSIS — I4460 Unspecified fascicular block: Secondary | ICD-10-CM | POA: Insufficient documentation

## 2020-10-18 LAB — ECG W INTERP (CLINIC ONLY)
Atrial Rate: 211 {beats}/min
Calculated R Axis: -73 degrees
Calculated T Axis: 64 degrees
QRS Duration: 108 ms
QT Interval: 388 ms
QTC Calculation: 469 ms
Ventricular rate: 88 {beats}/min

## 2020-10-18 MED ORDER — NITROGLYCERIN 0.4 MG SUBLINGUAL TABLET
0.4000 mg | SUBLINGUAL_TABLET | SUBLINGUAL | 1 refills | Status: DC | PRN
Start: 2020-10-18 — End: 2023-09-02

## 2020-10-18 NOTE — Progress Notes (Signed)
Thomas [name] is a 75 y.o. male seen in the office on 10/18/2020    Chief Complaint   Patient presents with   . Follow Up 6 Months     A-fib          Patient Active Problem List   Diagnosis   . Atrial flutter (CMS HCC)   . Body mass index (BMI) of 33.0 to 33.9 in adult   . Chest tightness   . Essential hypertension   . Hyperlipidemia   . Left bundle branch hemiblock   . Paroxysmal atrial flutter (CMS HCC)   . History of cardioversion   . History of cardiac cath-Overall normal coronary anatomy 12/26/2018   . Cardiac left ventricular ejection fraction 55-60% on TEE 12/26/18       History of Present Illness:  This is 75 year old male patient with a past medical history of paroxysmal atrial flutter/fib, hypertension, hyperlipidemia, gout, and chronic shortness of breath-patient is to see pulmonology in this regard. He was seen one time byDr. Kristen Cardinal for atrial flutter.Holter monitor, echocardiogram, and stress test werecompleted in the past. Patient was seen at our office to establish care. He was complaining of chest pain that radiated into his left armand palpitations.Nuclear stress test and Holter monitor were ordered. Patient underwent nuclear stress test which returned abnormal. Holter monitor was never completed. Cardiac catheterization was completed 12/26/2018 which revealed overall normal coronary anatomy with an estimated ejection fraction of 55%. Dr. Francie Massing recommended TEE/DC cardioversionashe was in atrial fibrillation/atrial flutter. TEE was completed revealing an EF of 55-60%. No evidence of thrombus was identified. The patient was successfully cardioverted to normal sinus rhythm and initiated on flecainide.  When the patient presented for follow-up following TEE/DC cardioversion he was back in atrial fibrillation.  The patient wished to continue with rate control and anticoagulation.  His flecainide was discontinued.  Since then, he did convert back to normal sinus and his flecainide  was re-initiated.  He has continued to go in and out of atrial fibrillation/flutter since then.    He presents to the office today for routine follow-up.  He presents today for the most part feeling well.  He does tell me he will take a nitroglycerin every now and again for chest discomfort but this is not frequent.  He denies dizziness, palpitations, near-syncope or syncope.  He does get occasional shortness of breath with activity but relates this to his weight.  He continues to take Eliquis and denies any signs or symptoms of bleeding.  His vital signs are stable.  Unfortunately, his EKG today does show he is in atrial flutter.  He is asymptomatic in this regard.      Current Outpatient Medications   Medication Sig   . acetaminophen (TYLENOL) 500 mg Oral Tablet Take 500 mg by mouth Every 4 hours as needed for Pain (pt takes tylenol pm)   . ANORO ELLIPTA 62.5-25 mcg/actuation Inhalation Disk with Device inhale ONE DOSE EVERY DAY AS DIRECTED   . apixaban (ELIQUIS) 5 mg Oral Tablet Take 5 mg by mouth   . dilTIAZem (CARDIZEM CD) 240 mg Oral Capsule, Sust. Release 24 hr TAKE ONE CAPSULE BY MOUTH EVERY DAY   . flecainide (TAMBOCOR) 100 mg Oral Tablet TAKE ONE TABLET BY MOUTH TWICE DAILY   . furosemide (LASIX) 40 mg Oral Tablet TAKE ONE TABLET BY MOUTH EVERY DAY   . hydrALAZINE (APRESOLINE) 10 mg Oral Tablet Take 1 Tablet (10 mg total) by mouth Three times a day   .  lisinopriL (PRINIVIL) 40 mg Oral Tablet Take 1 Tablet (40 mg total) by mouth Once a day   . melatonin 5 mg Oral Tablet Take 5 mg by mouth Every night   . nitroGLYCERIN (NITROSTAT) 0.4 mg Sublingual Tablet, Sublingual Place 1 Tablet (0.4 mg total) under the tongue Every 5 minutes as needed for Chest pain   . potassium chloride (KLOR-CON) 10 mEq Oral Tablet Sustained Release TAKE ONE TABLET BY MOUTH EVERY DAY   . simvastatin (ZOCOR) 20 mg Oral Tablet Take 1 Tablet (20 mg total) by mouth Every night   . traZODone (DESYREL) 50 mg Oral Tablet Take 1 Tablet (50 mg  total) by mouth Every night   . VITAMIN E ORAL Take 800 mg by mouth       No Known Allergies  Family Medical History:     Problem Relation (Age of Onset)    Cancer Mother    Hypertension (High Blood Pressure) Mother, Father          Social History     Socioeconomic History   . Marital status: Widowed   Tobacco Use   . Smoking status: Former Smoker     Years: 60.00     Types: Cigarettes, Cigars   . Smokeless tobacco: Current User     Types: Chew, Snuff   Vaping Use   . Vaping Use: Never used   Substance and Sexual Activity   . Alcohol use: Yes   . Drug use: Never   . Sexual activity: Not Currently         Physical Exam:  BP 134/78   Pulse (!) 104   Ht 1.803 m (5\' 11" )   Wt 123 kg (272 lb)   SpO2 95%   BMI 37.94 kg/m       Body mass index is 37.94 kg/m.  Wt Readings from Last 5 Encounters:   10/18/20 123 kg (272 lb)   10/01/20 123 kg (270 lb 8 oz)   04/01/20 121 kg (267 lb)   01/23/20 118 kg (259 lb 9.6 oz)   09/29/19 118 kg (259 lb 8 oz)     The patient is in no distress. Skin is pink, warm and dry. There is no neck vein distention. No carotid bruits heard. The lungs are clear bilaterally. The heart is irregular without any significant murmur, gallop,rub,or click. The abdomen is soft and nontender.  Bowel sounds positive.  The aorta is not palpable. Extremities show no significant edema.    PERTINENT DIAGNOSTICS:  EKG today shows atrial flutter with a rate of 104. Blood work done in 2021 showed cholesterol 164, triglycerides 120, HDL 53, LDL 87. Sodium 141, potassium 3.9, glucose 91, BUN 16, creatinine 0.99    IMPRESSION:  1. Paroxysmal atrial flutter (CMS HCC)    2. Essential hypertension    3. Left bundle branch hemiblock    4. Mixed hyperlipidemia    5. Obesity (BMI 35.0-39.9 without comorbidity)          PLAN:  This patient does seems stable from a cardiac standpoint.  He is asking for refill on his nitroglycerin.  I will send this to the pharmacy.  I did instruct on signs and symptoms of bleeding with the  use of Eliquis.  It does seem as though he goes in and out of atrial fibrillation/atrial flutter and we will monitor this.  He does continue to take flecainide.  We will see where he is at next visit with his EKG and possibly do  an event monitor.  If he is more so in atrial fibrillation/atrial flutter we may consider discontinuing the flecainide.  I will have him return to our office in 6 months for follow-up or sooner if needed.  He will contact our office with any further questions or concerns.  Dr. Francie Massing was readily available for discussion regarding this patient.    Orders Placed This Encounter   . EKG - In Clinic, Same Day (93000)   . nitroGLYCERIN (NITROSTAT) 0.4 mg Sublingual Tablet, Sublingual

## 2020-11-05 ENCOUNTER — Telehealth: Payer: Self-pay | Admitting: Internal Medicine

## 2020-11-05 NOTE — Telephone Encounter (Signed)
I would recommend to just stop and come for visit 3 months later for HgA1c check.

## 2020-11-05 NOTE — Telephone Encounter (Signed)
Patient is requesting an alternative for the  Semaglutide,0.25 or 0.5MG /DOS, (OZEMPIC, 0.25 OR 0.5 MG/DOSE,) 2 MG/1.5ML SOPN. He is about to be due for a refill but he does not want to refill the Ozempic.  Please advise and call back at [phone]  Preferred Pharmacy:  [id number] - [place], [place] Phone:  [phone]  Fax:  [phone]

## 2020-11-06 NOTE — Telephone Encounter (Signed)
Unable to get in contact with the patient. LDVM with Dr. Nathanial Millman recommendations. Office number was provided in case he has additional questions or concerns.   If patient calls back please schedule him for a 3 month follow up with [name].

## 2020-11-09 LAB — FECAL DNA TESTING (AMB): FECAL DNA TEST (AMB): NEGATIVE

## 2020-11-09 LAB — COLOGUARD® COLON CANCER SCREEN: COLOGUARD RESULT: NEGATIVE

## 2020-11-12 ENCOUNTER — Telehealth: Payer: Self-pay | Admitting: Pharmacist

## 2020-11-12 NOTE — Progress Notes (Signed)
    Chronic Care Management Pharmacy Assistant   Name: BENCE TRAPP  MRN: [id number] DOB: 01/14/1946   Medications: Outpatient Encounter Medications as of 11/12/2020  Medication Sig  . acetaminophen (TYLENOL) 500 MG tablet Take 1,000 mg by mouth every 6 (six) hours as needed for moderate pain.  Marland Kitchen acidophilus (RISAQUAD) CAPS capsule Take 1 capsule by mouth daily.  Marland Kitchen apixaban (ELIQUIS) 5 MG TABS tablet Take 5 mg by mouth 2 (two) times daily.  Marland Kitchen atenolol (TENORMIN) 25 MG tablet Take 12.5 mg by mouth daily.  Marland Kitchen atorvastatin (LIPITOR) 80 MG tablet Take 40 mg by mouth daily.  Marland Kitchen buPROPion (WELLBUTRIN SR) 150 MG 12 hr tablet Take 150 mg by mouth 2 (two) times daily.  . finasteride (PROSCAR) 5 MG tablet Take 5 mg by mouth at bedtime.  . furosemide (LASIX) 20 MG tablet Take 2 tablets (40 mg total) by mouth 2 (two) times daily. (Patient taking differently: Take 40 mg by mouth daily.)  . gabapentin (NEURONTIN) 300 MG capsule Take 300-600 mg by mouth See admin instructions. Take 600 mg by mouth three times daily and  300 mg at night  . ketoconazole (NIZORAL) 2 % shampoo Apply 1 application topically every other day.  . Menthol-Methyl Salicylate (THERA-GESIC PLUS) CREA Apply 1 application topically at bedtime. Apply to lower back  . Multiple Vitamins-Minerals (MULTIVITAMINS THER. W/MINERALS) TABS Take 1 tablet by mouth daily.  Marland Kitchen neomycin-polymyxin-hydrocortisone (CORTISPORIN) 3.5-10000-1 OTIC suspension Place 2 drops into both ears daily.  . nortriptyline (PAMELOR) 10 MG capsule 2 CAPSULES EVERY NIGHT AT BEDTIME (Patient taking differently: Take 20 mg by mouth at bedtime.)  . potassium chloride (KLOR-CON) 10 MEQ tablet Take 4 tablets (40 mEq total) by mouth 2 (two) times daily. (Patient taking differently: Take 20 mEq by mouth daily.)  . saccharomyces boulardii (FLORASTOR) 250 MG capsule Take 1 capsule (250 mg total) by mouth 2 (two) times daily.  . Semaglutide,0.25 or 0.5MG /DOS, (OZEMPIC, 0.25 OR 0.5  MG/DOSE,) 2 MG/1.5ML SOPN Inject 0.5 mg into the skin once a week. (Patient taking differently: Inject 0.5 mg into the skin once a week. PT TAKES ON Wednesday)   No facility-administered encounter medications on file as of 11/12/2020.    Pharmacist Review  Reviewed chart for medication changes and adherence.  No OVs, Consults, or hospital visits since last care coordination call / Pharmacist visit. No medication changes indicated  No gaps in adherence identified. Patient has follow up scheduled with pharmacy team. No further action required.  [place] Pharmacist Assistant [phone]  Time spent:6

## 2020-11-27 ENCOUNTER — Telehealth: Payer: Self-pay | Admitting: Pharmacist

## 2020-11-27 NOTE — Progress Notes (Signed)
Chronic Care Management Pharmacy Assistant   Name: Tanner [name] MRN: [id number] DOB: 1945-09-02  Reason for Encounter: Disease State - Hypertension   Recent office visits:  None noted  Recent consult visits:  None noted  Hospital visits:  None in previous 6 months  Medications: Outpatient Encounter Medications as of 11/27/2020  Medication Sig   acetaminophen (TYLENOL) 500 MG tablet Take 1,000 mg by mouth every 6 (six) hours as needed for moderate pain.   acidophilus (RISAQUAD) CAPS capsule Take 1 capsule by mouth daily.   apixaban (ELIQUIS) 5 MG TABS tablet Take 5 mg by mouth 2 (two) times daily.   atenolol (TENORMIN) 25 MG tablet Take 12.5 mg by mouth daily.   atorvastatin (LIPITOR) 80 MG tablet Take 40 mg by mouth daily.   buPROPion (WELLBUTRIN SR) 150 MG 12 hr tablet Take 150 mg by mouth 2 (two) times daily.   finasteride (PROSCAR) 5 MG tablet Take 5 mg by mouth at bedtime.   furosemide (LASIX) 20 MG tablet Take 2 tablets (40 mg total) by mouth 2 (two) times daily. (Patient taking differently: Take 40 mg by mouth daily.)   gabapentin (NEURONTIN) 300 MG capsule Take 300-600 mg by mouth See admin instructions. Take 600 mg by mouth three times daily and  300 mg at night   ketoconazole (NIZORAL) 2 % shampoo Apply 1 application topically every other day.   Menthol-Methyl Salicylate (THERA-GESIC PLUS) CREA Apply 1 application topically at bedtime. Apply to lower back   Multiple Vitamins-Minerals (MULTIVITAMINS THER. W/MINERALS) TABS Take 1 tablet by mouth daily.   neomycin-polymyxin-hydrocortisone (CORTISPORIN) 3.5-10000-1 OTIC suspension Place 2 drops into both ears daily.   nortriptyline (PAMELOR) 10 MG capsule 2 CAPSULES EVERY NIGHT AT BEDTIME (Patient taking differently: Take 20 mg by mouth at bedtime.)   potassium chloride (KLOR-CON) 10 MEQ tablet Take 4 tablets (40 mEq total) by mouth 2 (two) times daily. (Patient taking differently: Take 20 mEq by mouth daily.)    saccharomyces boulardii (FLORASTOR) 250 MG capsule Take 1 capsule (250 mg total) by mouth 2 (two) times daily.   Semaglutide,0.25 or 0.5MG /DOS, (OZEMPIC, 0.25 OR 0.5 MG/DOSE,) 2 MG/1.5ML SOPN Inject 0.5 mg into the skin once a week. (Patient taking differently: Inject 0.5 mg into the skin once a week. PT TAKES ON Wednesday)   No facility-administered encounter medications on file as of 11/27/2020.    Reviewed chart prior to disease state call. Spoke with patient regarding BP  Recent Office Vitals: BP Readings from Last 3 Encounters:  08/26/20 132/80  08/20/20 (!) 124/58  07/24/20 127/70   Pulse Readings from Last 3 Encounters:  08/26/20 86  08/20/20 70  07/24/20 65    Wt Readings from Last 3 Encounters:  08/26/20 242 lb (109.8 kg)  07/24/20 245 lb (111.1 kg)  07/23/20 253 lb (114.8 kg)     Kidney Function Lab Results  Component Value Date/Time   CREATININE 1.48 (H) 08/20/2020 01:26 PM   CREATININE 1.40 (H) 07/04/2020 11:35 AM   CREATININE 1.43 (H) 01/23/2020 04:03 PM   CREATININE 1.70 (H) 01/02/2020 11:27 AM   CREATININE 1.2 12/29/2013 01:56 PM   CREATININE 1.2 05/18/2013 09:49 AM   GFR 48.19 (L) 12/12/2019 02:43 PM   GFRNONAA 49 (L) 08/20/2020 01:26 PM   GFRNONAA 52 (L) 07/04/2020 11:35 AM   GFRAA 55 (L) 01/23/2020 04:03 PM   GFRAA 45 (L) 01/02/2020 11:27 AM    BMP Latest Ref Rng & Units 08/20/2020 07/04/2020 04/15/2020  Glucose 70 -  99 mg/dL 89 92 -  BUN 8 - 23 mg/dL 19 19 -  Creatinine 0.61 - 1.24 mg/dL 1.48(H) 1.40(H) -  BUN/Creat Ratio 10 - 24 - - -  Sodium 135 - 145 mmol/L 140 140 -  Potassium 3.5 - 5.1 mmol/L 3.8 4.1 -  Chloride 98 - 111 mmol/L 102 102 106  CO2 22 - 32 mmol/L 29 32 31(A)  Calcium 8.9 - 10.3 mg/dL 9.7 9.7 8.9    Current antihypertensive regimen:  Atenolol 25 mg - 1/2 tab daily AM  Furosemide 20 mg - 2 tab daily (prn) Klor-Con 10 mEq - 2 tab daily  How often are you checking your Blood Pressure? infrequently  Current home BP readings:  N/A  What recent interventions/DTPs have been made by any provider to improve Blood Pressure control since last CPP Visit:   None noted   Any recent hospitalizations or ED visits since last visit with CPP?   None  What diet changes have been made to improve Blood Pressure Control?  Patient states he has not had any diet changes but thinks he may have gained about 5 lbs.  What exercise is being done to improve your Blood Pressure Control?  Patient states he goes to the [facility] 2x a week.  Adherence Review: Is the patient currently on ACE/ARB medication? Yes Does the patient have >5 day gap between last estimated fill dates? No Atenolol 25 mg  Furosemide 20 mg  Klor-Con 10 mEq  Patient states he uses mail order and recently received shipment from [facility]: Atorvastatin - Refilled by mail order and recently received. Ozempic - last fill 07/24/20 84D -Patient states [name] told him to stop taking around the beginning of the year.  Patient states he suppose to see [name] in 3 months for a follow up visit.   Orinda Kenner, [place] Clinical Pharmacists Assistant [phone]  Time Spent: 77

## 2020-12-06 NOTE — Addendum Note (Signed)
Addended by: Charlton Haws on: 12/06/2020 01:07 PM   Modules accepted: Orders

## 2020-12-06 NOTE — Telephone Encounter (Signed)
Pt reports he stopped Ozempic months ago. Last A1c was 5.1%. Removed Ozempic from med list.

## 2020-12-12 ENCOUNTER — Other Ambulatory Visit (INDEPENDENT_AMBULATORY_CARE_PROVIDER_SITE_OTHER): Payer: Self-pay | Admitting: Family Medicine

## 2020-12-12 ENCOUNTER — Other Ambulatory Visit (INDEPENDENT_AMBULATORY_CARE_PROVIDER_SITE_OTHER): Payer: Self-pay | Admitting: Family

## 2020-12-13 NOTE — Telephone Encounter (Signed)
Reviewed active medications.  Parameters for refill met per departmental refill protocol . Refill sent electronically to pharmacy.  Order forwarded to provider for [facility]

## 2020-12-18 ENCOUNTER — Ambulatory Visit (INDEPENDENT_AMBULATORY_CARE_PROVIDER_SITE_OTHER): Payer: Self-pay | Admitting: Family Medicine

## 2020-12-18 NOTE — Telephone Encounter (Signed)
Tried to call pt back, lm to r/c

## 2020-12-18 NOTE — Telephone Encounter (Signed)
Regarding: advise/[place]  ----- Message from Gillian Shields sent at 12/18/2020 11:56 AM EDT -----  Rhoderick Moody, DO    Hello!  The pt had a positive home covid test and would like to speak with a nurse. Please advise the pt at [phone]   Thanks!  Gillian Shields

## 2020-12-19 NOTE — Telephone Encounter (Signed)
Left detailed message as i have not been able to reach him, left quarantine guidelines and also if not getting better or worsening to ER for eval and to possibly seek out AB treatment from them as well

## 2020-12-19 NOTE — Telephone Encounter (Signed)
Regarding: [facility] pt// Return call  ----- Message from Hector Brunswick McCutcheon sent at 12/18/2020  3:04 PM EDT -----  Rhoderick Moody, DO    Pt returning call to clinic, please call back when you can.     Thanks,  Hector Brunswick McCutcheon       ----- Message from Gillian Shields sent at 12/18/2020 11:56 AM EDT -----  Rhoderick Moody, DO    Hello!  The pt had a positive home covid test and would like to speak with a nurse. Please advise the pt at [phone]   Thanks!  Gillian Shields

## 2021-01-01 ENCOUNTER — Inpatient Hospital Stay: Payer: Medicare HMO | Admitting: Hematology & Oncology

## 2021-01-01 ENCOUNTER — Inpatient Hospital Stay: Payer: Medicare HMO | Attending: Hematology & Oncology

## 2021-01-01 ENCOUNTER — Other Ambulatory Visit: Payer: Self-pay

## 2021-01-01 ENCOUNTER — Encounter: Payer: Self-pay | Admitting: Hematology & Oncology

## 2021-01-01 VITALS — BP 124/56 | HR 70 | Temp 98.5°F | Resp 18 | Wt 262.0 lb

## 2021-01-01 DIAGNOSIS — Z79899 Other long term (current) drug therapy: Secondary | ICD-10-CM | POA: Insufficient documentation

## 2021-01-01 DIAGNOSIS — I2699 Other pulmonary embolism without acute cor pulmonale: Secondary | ICD-10-CM | POA: Diagnosis not present

## 2021-01-01 DIAGNOSIS — Z86711 Personal history of pulmonary embolism: Secondary | ICD-10-CM | POA: Diagnosis not present

## 2021-01-01 DIAGNOSIS — Z7901 Long term (current) use of anticoagulants: Secondary | ICD-10-CM | POA: Insufficient documentation

## 2021-01-01 DIAGNOSIS — R600 Localized edema: Secondary | ICD-10-CM | POA: Diagnosis not present

## 2021-01-01 DIAGNOSIS — I82402 Acute embolism and thrombosis of unspecified deep veins of left lower extremity: Secondary | ICD-10-CM | POA: Insufficient documentation

## 2021-01-01 LAB — CBC WITH DIFFERENTIAL (CANCER CENTER ONLY)
Abs Immature Granulocytes: 0.02 10*3/uL (ref 0.00–0.07)
Basophils Absolute: 0.1 10*3/uL (ref 0.0–0.1)
Basophils Relative: 1 %
Eosinophils Absolute: 0.3 10*3/uL (ref 0.0–0.5)
Eosinophils Relative: 4 %
HCT: 42.2 % (ref 39.0–52.0)
Hemoglobin: 13.2 g/dL (ref 13.0–17.0)
Immature Granulocytes: 0 %
Lymphocytes Relative: 23 %
Lymphs Abs: 1.7 10*3/uL (ref 0.7–4.0)
MCH: 27 pg (ref 26.0–34.0)
MCHC: 31.3 g/dL (ref 30.0–36.0)
MCV: 86.5 fL (ref 80.0–100.0)
Monocytes Absolute: 0.6 10*3/uL (ref 0.1–1.0)
Monocytes Relative: 8 %
Neutro Abs: 4.8 10*3/uL (ref 1.7–7.7)
Neutrophils Relative %: 64 %
Platelet Count: 174 10*3/uL (ref 150–400)
RBC: 4.88 MIL/uL (ref 4.22–5.81)
RDW: 15.6 % — ABNORMAL HIGH (ref 11.5–15.5)
WBC Count: 7.4 10*3/uL (ref 4.0–10.5)
nRBC: 0 % (ref 0.0–0.2)

## 2021-01-01 LAB — CMP (CANCER CENTER ONLY)
ALT: 10 U/L (ref 0–44)
AST: 11 U/L — ABNORMAL LOW (ref 15–41)
Albumin: 3.9 g/dL (ref 3.5–5.0)
Alkaline Phosphatase: 95 U/L (ref 38–126)
Anion gap: 6 (ref 5–15)
BUN: 19 mg/dL (ref 8–23)
CO2: 30 mmol/L (ref 22–32)
Calcium: 9.5 mg/dL (ref 8.9–10.3)
Chloride: 104 mmol/L (ref 98–111)
Creatinine: 1.57 mg/dL — ABNORMAL HIGH (ref 0.61–1.24)
GFR, Estimated: 45 mL/min — ABNORMAL LOW (ref >60)
Glucose, Bld: 130 mg/dL — ABNORMAL HIGH (ref 70–99)
Potassium: 4.3 mmol/L (ref 3.5–5.1)
Sodium: 140 mmol/L (ref 135–145)
Total Bilirubin: 0.7 mg/dL (ref 0.3–1.2)
Total Protein: 6.2 g/dL — ABNORMAL LOW (ref 6.5–8.1)

## 2021-01-01 LAB — LACTATE DEHYDROGENASE: LDH: 144 U/L (ref 98–192)

## 2021-01-01 NOTE — Progress Notes (Signed)
Hematology and Oncology Follow Up Visit  DAE HIGHLEY [id number] [birth year] 75 y.o. 01/01/2021   Principle Diagnosis:  Recurrent pulmonary emboli/left leg thrombus -- Idiopathic  Current Therapy:   Eliquis 5 mg p.o. twice daily - started 06/29/2017   Interim History: Tanner [name] is here today for follow-up.  He had the surgery for the left knee in March.  He tolerated this quite well.  He had no problems with bleeding.  He had no problems with blood clotting.  He is back on Eliquis.  He is on 5 mg p.o. twice daily.  It would be nice if he would lose a little more weight.  His weight is up 12 pounds since we last saw him.  He has had no issues with fever.  He has had no cough or shortness of breath.  He has had no chest pain.  He has lymphedema in his legs.  This is chronic.  He does have compression stockings.  There is been no change in bowel or bladder habits.  Overall, his performance status is ECOG 1.   Medications:  Allergies as of 01/01/2021       Reactions   Lisinopril    Other reaction(s): Renal impairment   Amlodipine Swelling   Codeine Sulfate Itching, Nausea Only        Medication List        Accurate as of January 01, 2021  1:35 PM. If you have any questions, ask your nurse or doctor.          acetaminophen 500 MG tablet Commonly known as: TYLENOL Take 1,000 mg by mouth every 6 (six) hours as needed for moderate pain.   acetaminophen 500 MG tablet Commonly known as: TYLENOL Take 500 mg by mouth 2 (two) times daily as needed.   acidophilus Caps capsule Take 1 capsule by mouth daily.   atenolol 25 MG tablet Commonly known as: TENORMIN Take 12.5 mg by mouth daily.   atorvastatin 80 MG tablet Commonly known as: LIPITOR Take 40 mg by mouth daily.   buPROPion 150 MG 12 hr tablet Commonly known as: WELLBUTRIN SR Take 150 mg by mouth 2 (two) times daily.   Eliquis 5 MG Tabs tablet Generic drug: apixaban Take 5 mg by mouth 2 (two) times daily.    finasteride 5 MG tablet Commonly known as: PROSCAR Take 5 mg by mouth at bedtime.   furosemide 20 MG tablet Commonly known as: LASIX Take 2 tablets (40 mg total) by mouth 2 (two) times daily. What changed: when to take this   gabapentin 300 MG capsule Commonly known as: NEURONTIN Take 300-600 mg by mouth See admin instructions. Take 600 mg by mouth three times daily and  300 mg at night   gabapentin 300 MG capsule Commonly known as: NEURONTIN Take 300 mg by mouth.   ketoconazole 2 % shampoo Commonly known as: NIZORAL Apply 1 application topically every other day.   multivitamins ther. w/minerals Tabs tablet Take 1 tablet by mouth daily.   neomycin-polymyxin-hydrocortisone 3.5-10000-1 OTIC suspension Commonly known as: CORTISPORIN Place 2 drops into both ears daily.   nortriptyline 10 MG capsule Commonly known as: PAMELOR 2 CAPSULES EVERY NIGHT AT BEDTIME What changed:  how much to take how to take this when to take this additional instructions   potassium chloride 10 MEQ tablet Commonly known as: KLOR-CON Take 4 tablets (40 mEq total) by mouth 2 (two) times daily. What changed:  how much to take when to take this  saccharomyces boulardii 250 MG capsule Commonly known as: FLORASTOR Take 1 capsule (250 mg total) by mouth 2 (two) times daily.   sildenafil 100 MG tablet Commonly known as: VIAGRA Take 100 mg by mouth as needed.   Thera-Gesic Plus Crea Apply 1 application topically at bedtime. Apply to lower back        Allergies:  Allergies  Allergen Reactions   Lisinopril     Other reaction(s): Renal impairment   Amlodipine Swelling   Codeine Sulfate Itching and Nausea Only    Past Medical History, Surgical history, Social history, and Family History were reviewed and updated.  Review of Systems: Review of Systems  Constitutional: Negative.   HENT: Negative.    Eyes: Negative.   Respiratory: Negative.    Cardiovascular: Negative.    Gastrointestinal: Negative.   Genitourinary: Negative.   Musculoskeletal: Negative.   Skin: Negative.   Neurological: Negative.   Endo/Heme/Allergies: Negative.   Psychiatric/Behavioral: Negative.      Physical Exam:  weight is 262 lb (118.8 kg). His oral temperature is 98.5 F (36.9 C). His blood pressure is 124/56 (abnormal) and his pulse is 70. His respiration is 18 and oxygen saturation is 98%.   Wt Readings from Last 3 Encounters:  01/01/21 262 lb (118.8 kg)  08/26/20 242 lb (109.8 kg)  07/24/20 245 lb (111.1 kg)    Physical Exam Vitals reviewed.  HENT:     Head: Normocephalic and atraumatic.  Eyes:     Pupils: Pupils are equal, round, and reactive to light.  Cardiovascular:     Rate and Rhythm: Normal rate and regular rhythm.     Heart sounds: Normal heart sounds.  Pulmonary:     Effort: Pulmonary effort is normal.     Breath sounds: Normal breath sounds.  Abdominal:     General: Bowel sounds are normal.     Palpations: Abdomen is soft.  Musculoskeletal:        General: No tenderness or deformity. Normal range of motion.     Cervical back: Normal range of motion.  Lymphadenopathy:     Cervical: No cervical adenopathy.  Skin:    General: Skin is warm and dry.     Findings: No erythema or rash.  Neurological:     Mental Status: He is alert and oriented to person, place, and time.  Psychiatric:        Behavior: Behavior normal.        Thought Content: Thought content normal.        Judgment: Judgment normal.     Lab Results  Component Value Date   WBC 7.4 01/01/2021   HGB 13.2 01/01/2021   HCT 42.2 01/01/2021   MCV 86.5 01/01/2021   PLT 174 01/01/2021   Lab Results  Component Value Date   FERRITIN 80 10/12/2019   IRON 86 10/12/2019   TIBC 297 10/12/2019   UIBC 211 10/12/2019   IRONPCTSAT 29 10/12/2019   Lab Results  Component Value Date   RETICCTPCT 2.5 10/12/2019   RBC 4.88 01/01/2021   No results found for: KPAFRELGTCHN, LAMBDASER,  KAPLAMBRATIO Lab Results  Component Value Date   IGGSERUM 549 (L) 04/07/2017   IGMSERUM 144 04/07/2017   Lab Results  Component Value Date   ALBUMINELP 3.7 (L) 04/07/2017   A1GS 0.4 (H) 04/07/2017   A2GS 0.8 04/07/2017   BETS 0.5 04/07/2017   BETA2SER 0.3 04/07/2017   GAMS 0.6 (L) 04/07/2017   SPEI  04/07/2017     Comment:     .  One or more serum protein fractions are outside the normal ranges.  No abnormal protein bands are apparent. .      Chemistry      Component Value Date/Time   NA 140 01/01/2021 1143   NA 143 01/23/2020 1603   NA 136 12/29/2013 1356   K 4.3 01/01/2021 1143   K 3.4 12/29/2013 1356   CL 104 01/01/2021 1143   CL 97 (L) 12/29/2013 1356   CO2 30 01/01/2021 1143   CO2 33 12/29/2013 1356   BUN 19 01/01/2021 1143   BUN 16 01/23/2020 1603   BUN 15 12/29/2013 1356   CREATININE 1.57 (H) 01/01/2021 1143   CREATININE 1.2 12/29/2013 1356   GLU 84 11/07/2014 0000      Component Value Date/Time   CALCIUM 9.5 01/01/2021 1143   CALCIUM 8.9 12/29/2013 1356   ALKPHOS 95 01/01/2021 1143   ALKPHOS 59 12/29/2013 1356   AST 11 (L) 01/01/2021 1143   ALT 10 01/01/2021 1143   ALT 27 12/29/2013 1356   BILITOT 0.7 01/01/2021 1143      Impression and Plan: Mr. Tober is a very pleasant 75 yo caucasian gentleman with recurrent pulmonary embolism/thromboembolic disease.  He is on lifelong Eliquis.  I do not think we have to do any scans on him.  I do not think we need any Dopplers right now.  He has a chronic nonocclusive thrombus in his left leg.  We will plan to get him back in 6 months.  Maybe, we will be able to cut his Eliquis dose down to 2.5 mg p.o. twice daily.  Hopefully, he will have a good summer and fall.  I told him to stay hydrated.   Volanda Napoleon, MD 7/20/20221:35 PM

## 2021-01-13 ENCOUNTER — Ambulatory Visit (INDEPENDENT_AMBULATORY_CARE_PROVIDER_SITE_OTHER): Payer: Medicare HMO

## 2021-01-13 ENCOUNTER — Other Ambulatory Visit: Payer: Self-pay

## 2021-01-13 ENCOUNTER — Encounter: Payer: Self-pay | Admitting: Internal Medicine

## 2021-01-13 ENCOUNTER — Other Ambulatory Visit: Payer: Self-pay | Admitting: Internal Medicine

## 2021-01-13 ENCOUNTER — Ambulatory Visit (INDEPENDENT_AMBULATORY_CARE_PROVIDER_SITE_OTHER): Payer: Medicare HMO | Admitting: Internal Medicine

## 2021-01-13 VITALS — BP 132/80 | HR 77 | Temp 97.7°F | Resp 18 | Ht 65.5 in | Wt 264.6 lb

## 2021-01-13 DIAGNOSIS — N529 Male erectile dysfunction, unspecified: Secondary | ICD-10-CM

## 2021-01-13 DIAGNOSIS — M25641 Stiffness of right hand, not elsewhere classified: Secondary | ICD-10-CM | POA: Insufficient documentation

## 2021-01-13 DIAGNOSIS — M19041 Primary osteoarthritis, right hand: Secondary | ICD-10-CM | POA: Diagnosis not present

## 2021-01-13 DIAGNOSIS — M25642 Stiffness of left hand, not elsewhere classified: Secondary | ICD-10-CM

## 2021-01-13 DIAGNOSIS — D234 Other benign neoplasm of skin of scalp and neck: Secondary | ICD-10-CM | POA: Diagnosis not present

## 2021-01-13 DIAGNOSIS — E1169 Type 2 diabetes mellitus with other specified complication: Secondary | ICD-10-CM | POA: Diagnosis not present

## 2021-01-13 DIAGNOSIS — R7989 Other specified abnormal findings of blood chemistry: Secondary | ICD-10-CM

## 2021-01-13 DIAGNOSIS — I10 Essential (primary) hypertension: Secondary | ICD-10-CM

## 2021-01-13 DIAGNOSIS — M19042 Primary osteoarthritis, left hand: Secondary | ICD-10-CM | POA: Diagnosis not present

## 2021-01-13 LAB — VITAMIN D 25 HYDROXY (VIT D DEFICIENCY, FRACTURES): VITD: 45.21 ng/mL (ref 30.00–100.00)

## 2021-01-13 LAB — POCT GLYCOSYLATED HEMOGLOBIN (HGB A1C): Hemoglobin A1C: 5.6 % (ref 4.0–5.6)

## 2021-01-13 LAB — TSH: TSH: 6.45 u[IU]/mL — ABNORMAL HIGH (ref 0.35–5.50)

## 2021-01-13 LAB — VITAMIN B12: Vitamin B-12: 245 pg/mL (ref 211–911)

## 2021-01-13 NOTE — Assessment & Plan Note (Signed)
Ordered x-ray and TSH and vitamin D and B12. Treat as appropriate.

## 2021-01-13 NOTE — Assessment & Plan Note (Signed)
Referral to dermatology for removal

## 2021-01-13 NOTE — Assessment & Plan Note (Signed)
We are asking him to hold atenolol due to possible ED as a result. Monitor BP closely and let us know if >140/80 and will need alternate medication.

## 2021-01-13 NOTE — Assessment & Plan Note (Signed)
Getting viagra rx from [place] and wants to try stopping atenolol as this is side effect. Will stop atenolol and monitor BP closely. Okay to try viagra still for this.

## 2021-01-13 NOTE — Assessment & Plan Note (Signed)
Ordered x-ray left hand and TSH and B12 and vitamin D. Treat as appropriate.

## 2021-01-13 NOTE — Assessment & Plan Note (Addendum)
POC HgA1c done today 5.6. He is diet controlled at this time. Still down about 20 pounds from most recent weight. We talked about checking HgA1c in 6 months and if well controlled then could change to history of diabetes. He is on statin. He is not on ACE-I/ARB due to AKI after.

## 2021-01-13 NOTE — Progress Notes (Signed)
   Subjective:   Patient ID: Tanner [name], male    DOB: [birth year], 75 y.o.   MRN: [id number]  HPI The patient is a 75 YO man coming in for follow up diabetes. Called back in May wanting alternative for ozempic and was advised to stop and follow up 3 months after stopping which he has done. His weight has gone up about 10 pounds since stopping ozempic. Also having several new concerns including pain and stiffness both hands (worsening in recent months, going on years, has tried otc without relief) and cyst on scalp (would like removed, no pain)  Review of Systems  Constitutional: Negative.   HENT: Negative.    Eyes: Negative.   Respiratory:  Negative for cough, chest tightness and shortness of breath.   Cardiovascular:  Negative for chest pain, palpitations and leg swelling.  Gastrointestinal:  Negative for abdominal distention, abdominal pain, constipation, diarrhea, nausea and vomiting.  Musculoskeletal:  Positive for arthralgias, joint swelling and myalgias. Negative for neck pain and neck stiffness.  Skin: Negative.   Neurological: Negative.   Psychiatric/Behavioral: Negative.     Objective:  Physical Exam Constitutional:      Appearance: He is well-developed. He is obese.  HENT:     Head: Normocephalic and atraumatic.     Comments: Cyst right posterior scalp without evidence of infection Cardiovascular:     Rate and Rhythm: Normal rate and regular rhythm.  Pulmonary:     Effort: Pulmonary effort is normal. No respiratory distress.     Breath sounds: Normal breath sounds. No wheezing or rales.  Abdominal:     General: Bowel sounds are normal. There is no distension.     Palpations: Abdomen is soft.     Tenderness: There is no abdominal tenderness. There is no rebound.  Musculoskeletal:     Cervical back: Normal range of motion.     Comments: Well healed left knee replacement scar  Skin:    General: Skin is warm and dry.  Neurological:     Mental Status: He is alert and  oriented to person, place, and time.     Coordination: Coordination normal.    Vitals:   01/13/21 0850  BP: 132/80  Pulse: 77  Resp: 18  Temp: 97.7 F (36.5 C)  TempSrc: Oral  SpO2: 96%  Weight: 264 lb 9.6 oz (120 kg)  Height: 5' 5.5" (1.664 m)    This visit occurred during the SARS-CoV-2 public health emergency.  Safety protocols were in place, including screening questions prior to the visit, additional usage of staff PPE, and extensive cleaning of exam room while observing appropriate contact time as indicated for disinfecting solutions.   Assessment & Plan:

## 2021-01-13 NOTE — Patient Instructions (Addendum)
We will recheck the sugars in about 6 months so keep up the good work with diet.   We will get you in with a dermatologist for the cyst on the scalp.  We will have you stop the atenolol to see if this helps. Watch the blood pressure and let us know how this is doing. If the blood pressure goes above 140/90 let us know as you may need another blood pressure medicine.

## 2021-01-24 ENCOUNTER — Encounter (HOSPITAL_COMMUNITY): Payer: Self-pay | Admitting: Student in an Organized Health Care Education/Training Program

## 2021-01-24 ENCOUNTER — Ambulatory Visit
Payer: No Typology Code available for payment source | Attending: Student in an Organized Health Care Education/Training Program | Admitting: Student in an Organized Health Care Education/Training Program

## 2021-01-24 ENCOUNTER — Other Ambulatory Visit: Payer: Self-pay

## 2021-01-24 ENCOUNTER — Ambulatory Visit (HOSPITAL_COMMUNITY): Payer: No Typology Code available for payment source

## 2021-01-24 VITALS — BP 136/78 | HR 80 | Resp 19 | Ht 71.0 in | Wt 272.0 lb

## 2021-01-24 DIAGNOSIS — E785 Hyperlipidemia, unspecified: Secondary | ICD-10-CM

## 2021-01-24 DIAGNOSIS — I4892 Unspecified atrial flutter: Secondary | ICD-10-CM

## 2021-01-24 DIAGNOSIS — I447 Left bundle-branch block, unspecified: Secondary | ICD-10-CM

## 2021-01-24 DIAGNOSIS — Z7722 Contact with and (suspected) exposure to environmental tobacco smoke (acute) (chronic): Secondary | ICD-10-CM

## 2021-01-24 DIAGNOSIS — I10 Essential (primary) hypertension: Secondary | ICD-10-CM | POA: Insufficient documentation

## 2021-01-24 DIAGNOSIS — E669 Obesity, unspecified: Secondary | ICD-10-CM

## 2021-01-24 DIAGNOSIS — Z87891 Personal history of nicotine dependence: Secondary | ICD-10-CM

## 2021-01-24 DIAGNOSIS — J449 Chronic obstructive pulmonary disease, unspecified: Secondary | ICD-10-CM | POA: Insufficient documentation

## 2021-01-24 DIAGNOSIS — Z9989 Dependence on other enabling machines and devices: Secondary | ICD-10-CM | POA: Insufficient documentation

## 2021-01-24 DIAGNOSIS — G4733 Obstructive sleep apnea (adult) (pediatric): Secondary | ICD-10-CM | POA: Insufficient documentation

## 2021-01-24 LAB — BASIC METABOLIC PANEL, FASTING
ANION GAP: 6 mmol/L (ref 6–15)
BUN: 17 mg/dL (ref 7–21)
CALCIUM: 9 mg/dL (ref 8.0–10.6)
CHLORIDE: 102 mmol/L (ref 98–107)
CO2 TOTAL: 33 mmol/L — ABNORMAL HIGH (ref 21–32)
CREATININE: 0.98 mg/dL (ref 0.80–1.60)
ESTIMATED GFR: >60 mL/min/{1.73_m2}
GLUCOSE: 94 mg/dL (ref 70–100)
POTASSIUM: 3.8 mmol/L (ref 3.3–5.1)
SODIUM: 141 mmol/L (ref 136–146)

## 2021-01-24 LAB — CBC WITH DIFF
BASOPHIL #: <0.1 10*3/uL (ref <=0.20)
BASOPHIL %: 1 %
EOSINOPHIL #: 0.28 10*3/uL (ref <=0.50)
EOSINOPHIL %: 4 %
HCT: 45.5 % (ref 38.9–52.0)
HGB: 15.1 g/dL (ref 13.4–17.5)
IMMATURE GRANULOCYTE #: <0.1 10*3/uL (ref <0.10)
IMMATURE GRANULOCYTE %: 0 % (ref 0–1)
LYMPHOCYTE #: 2.53 10*3/uL (ref 1.00–4.80)
LYMPHOCYTE %: 33 %
MCH: 30.9 pg (ref 26.0–32.0)
MCHC: 33.2 g/dL (ref 31.0–35.5)
MCV: 93.2 fL (ref 78.0–100.0)
MONOCYTE #: 0.68 10*3/uL (ref 0.20–1.10)
MONOCYTE %: 9 %
MPV: 9.6 fL (ref 8.7–12.5)
NEUTROPHIL #: 4.2 10*3/uL (ref 1.50–7.70)
NEUTROPHIL %: 53 %
PLATELETS: 169 10*3/uL (ref 150–400)
RBC: 4.88 10*6/uL (ref 4.50–6.10)
RDW-CV: 12.9 % (ref 11.5–15.5)
WBC: 7.8 10*3/uL (ref 3.7–11.0)

## 2021-01-24 LAB — AST (SGOT): AST (SGOT): 16 U/L (ref 5–34)

## 2021-01-24 LAB — LIPID PANEL
CHOL/HDL RATIO: 2.6
CHOLESTEROL: 126 mg/dL (ref 75–200)
HDL CHOL: 49 mg/dL (ref 27–58)
LDL CALC: 62 mg/dL (ref 0–99)
TRIGLYCERIDES: 77 mg/dL (ref 28–153)
VLDL CALC: 15 mg/dL (ref 0–50)

## 2021-01-24 LAB — ALT (SGPT): ALT (SGPT): 18 U/L (ref 0–55)

## 2021-01-24 MED ORDER — TIOTROPIUM BROMIDE 18 MCG CAPSULE WITH INHALATION DEVICE
18.0000 ug | ORAL_CAPSULE | Freq: Every day | RESPIRATORY_TRACT | 1 refills | Status: DC
Start: 2021-01-24 — End: 2021-04-01

## 2021-01-24 MED ORDER — ALBUTEROL SULFATE HFA 90 MCG/ACTUATION AEROSOL INHALER
1.0000 | INHALATION_SPRAY | Freq: Four times a day (QID) | RESPIRATORY_TRACT | 4 refills | Status: DC | PRN
Start: 2021-01-24 — End: 2021-04-01

## 2021-01-24 NOTE — Progress Notes (Addendum)
PULMONARY OFFICE VISIT    Thomas [name], 75 y.o. male  Date of service: 01/24/2021  Date of Birth:  January 18, 1946    IMPRESSION:  Chronic obstructive pulmonary disease  Elevated left hemidiaphragm  Suspected sleep apnea  Former smoker  Secondhand Smoke exposure  Obesity  Atrial fibrillation/flutter with history of cardioversion  Hypertension  Left bundle branch block    RECOMMENDATIONS:  Patient's old PFT indicates mild airway obstruction.  Will obtain repeat pulmonary function tests to reevaluate.  Command patient on stopping smoking.  Patient also reports concern that his cardiologist thinks his inhaler is making it difficulty to control his blood pressure. We discussed the risks and benefits of stepping down his inhaler therapy and will do a trial of removing LAMA from his regimen. Will change from Anoro to Spiriva. LABA and can possibly cause elevated blood pressure and given the patient's symptoms are well controlled with no exacerbations in the last few years week and a trial of stepping down therapy. Although his CAT score is high his symptoms may be multifactorial including due to deconditioning. If patients symptoms worsen will up titrate therapy again.   Prescribed Albuterol p.r.n. in case of emergencies however patient advised that it may cause side effects including elevated heart rate.  Encouraged patient to continue weight loss, given beneficial effect on his or all health and is breathing.   Will obtain home sleep apnea testing to evaluate for OSA.  Patient consult regarding the possible risks of untreated OSA.  If the patient's symptoms worsen will reconsider stopping of therapy, and person evaluating for elevated left hemidiaphragm as mentioned in prior documentation.  Return to clinic in 3 months.  Patient advised to return sooner if symptoms worsen.    Orders Placed This Encounter   . PULMONARY FUNCTION TESTING - ADULT   . POLYSOMNOGRAPHY - SLEEP STUDY - UNATTENDED   . tiotropium bromide (SPIRIVA  HANDIHALER) 18 mcg Inhalation Capsule, w/Inhalation Device   . albuterol sulfate (VENTOLIN HFA) 90 mcg/actuation Inhalation HFA Aerosol Inhaler      Problem List Items Addressed This Visit    None     Visit Diagnoses     Chronic obstructive pulmonary disease, unspecified COPD type (CMS [place])    -  Primary    Relevant Orders    PULMONARY FUNCTION TESTING - ADULT    OSA on CPAP        OSA (obstructive sleep apnea)        Relevant Orders    POLYSOMNOGRAPHY - SLEEP STUDY - UNATTENDED        CHIEF COMPLAINT:  Shortness of breath    HISTORY OF PRESENT ILLNESS:    Thomas [name] is a 75 y.o. male with PMH of COPD COLD stage B, former smoker, questionable elevated left diaphragm, obesity, hypertension, atrial fibrillation/flutter with history of cardioversion, left bundle branch block.  Patient is here to establish care.  He was diagnosed with COPD 2 years ago and was started on Anoro.  He states that he has been doing pretty well since he has been on air Anoro he.  He noted significant improvement in his shortness of breath.  He does not need albuterol at home.  He has not had any ED visits or hospitalizations.  He has not had any prednisone or antibiotics to use in the last 2 years.  He is able to walk on flat ground without any limitation in his activity, however he is only able to go up 3 flights of  stairs before he gets short of breath.    COPD assessment tet score: 21    Symptoms:  Shortness of breath on exertion  SABA use: none  Triggers:  Exertion.  No known allergic triggers  Intubations:  None  Exacerbations in the last year:  None  Prednisone use in the last year:  None  Antibiotic use in the last year:  None  Disease severity:  COPD gold stage A  Tobacco:  Patient has been smoking cigars and pipes from age 79 to age 39.   Occupation: Teacher, English as a foreign language   Exposures: (asbestos, coal, silica, mold): none  Travel: none  Pets/birds: none  Family history of lung disorder: none   Family history of lung cancer:  none  Thomas [name] has OSA    Patient is High risk for OSA given the history of hypertension, elevated BMI, symptoms of snoring, occasional morning headaches, and Epworth Sleepiness Scale of 10.     Per review of prior records from Care everywhere patient saw Thomas [name] from pulmonary in [facility] on 04/05/2019 at which time he was planned to be evaluated with stiff testing, sleep apnea testing and pulmonary function testing.  The testing was not completed at that time.    Past Medical History:   Diagnosis Date   . Arthritis    . Dysuria    . Enteritis due to Rotavirus    . Gout    . Headache    . Hemorrhoid    . Hypercholesterolemia    . Hypertension      Past Surgical History:   Procedure Laterality Date   . HX HEMORRHOIDECTOMY        Cannot display prior to admission medications because the patient has not been admitted in this contact.        No current facility-administered medications for this visit.    No Known Allergies  Social History     Tobacco Use   . Smoking status: Former Smoker     Years: 20.00     Types: Cigars   . Smokeless tobacco: Current User     Types: Chew, Snuff   Substance Use Topics   . Alcohol use: Yes     Comment: occasional     Family History:  relevant history summarized above in the HPI.   No other family history of pulmonary significance.     ROS:   Other than ROS in the HPI, all other systems were negative.    EXAM:  Heart Rate: 80  BP (Non-Invasive): 136/78  Respiratory Rate: 19  SpO2: 93 %  General: Seated, in no acute distress  HEENT: Atraumatic normocephalic.   Neck: Trachea in the midline. Supple.   Cardiovascular: S1-S2 regular. No murmur.  Lungs: Good air entry bilaterally. No rales rhonchi or wheezing.  GI: Soft, nontender. No rebound or guarding. Active bowel sounds.  Musculoskeletal: No joint swelling or erythema or tenderness  Extremities: No edema clubbing or cyanosis.  Skin: Dry, warm to touch. Normal turgor.   Neurological: Awake, alert , oriented.  Nonfocal.  Psychiatric: Normal mood and affect.      Labs:    Results for orders placed or performed in visit on 01/24/21 (from the past 24 hour(s))   ALT (SGPT)   Result Value Ref Range    ALT (SGPT) 18 0 - 55 U/L   AST (SGOT)   Result Value Ref Range    AST (SGOT) 16 5 - 34 U/L   LIPID PANEL  Result Value Ref Range    HDL CHOL 49 27 - 58 mg/dL    CHOLESTEROL 126 75 - 200 mg/dL    TRIGLYCERIDES 77 28 - 153 mg/dL    LDL CALC 62 0 - 99 mg/dL    VLDL CALC 15 0 - 50 mg/dL    CHOL/HDL RATIO 2.6    BASIC METABOLIC PANEL, FASTING   Result Value Ref Range    SODIUM 141 136 - 146 mmol/L    POTASSIUM 3.8 3.3 - 5.1 mmol/L    CHLORIDE 102 98 - 107 mmol/L    CO2 TOTAL 33 (H) 21 - 32 mmol/L    ANION GAP 6 6 - 15 mmol/L    CALCIUM 9.0 8.0 - 10.6 mg/dL    GLUCOSE 94 70 - 100 mg/dL    BUN 17 7 - 21 mg/dL    CREATININE 0.98 0.80 - 1.60 mg/dL    ESTIMATED GFR >60 mL/min/1.78m2    Narrative    Estimated Glomerular Filtration Rate (eGFR) is calculated using the CKD-EPI (2021) equation, intended for patients 159years of age and older. If gender is not documented or "unknown", there will be no eGFR calculation.   CBC/DIFF    Narrative    The following orders were created for panel order CBC/DIFF.  Procedure                               Abnormality         Status                     ---------                               -----------         ------                     CBC WITH DIFF[[id number]]                                    Final result                 Please view results for these tests on the individual orders.   CBC WITH DIFF   Result Value Ref Range    WBC 7.8 3.7 - 11.0 x10^3/uL    RBC 4.88 4.50 - 6.10 x10^6/uL    HGB 15.1 13.4 - 17.5 g/dL    HCT 45.5 38.9 - 52.0 %    MCV 93.2 78.0 - 100.0 fL    MCH 30.9 26.0 - 32.0 pg    MCHC 33.2 31.0 - 35.5 g/dL    RDW-CV 12.9 11.5 - 15.5 %    PLATELETS 169 150 - 400 x10^3/uL    MPV 9.6 8.7 - 12.5 fL    NEUTROPHIL % 53 %    LYMPHOCYTE % 33 %    MONOCYTE % 9 %    EOSINOPHIL % 4 %    BASOPHIL  % 1 %    NEUTROPHIL # 4.20 1.50 - 7.70 x10^3/uL    LYMPHOCYTE # 2.53 1.00 - 4.80 x10^3/uL    MONOCYTE # 0.68 0.20 - 1.10 x10^3/uL    EOSINOPHIL # 0.28 <=0.50 x10^3/uL    BASOPHIL # <0.10 <=0.20 x10^3/uL    IMMATURE GRANULOCYTE %  0 0 - 1 %    IMMATURE GRANULOCYTE # <0.10 <0.10 x10^3/uL      Imaging Studies:      CXR:  None on file    CT CHEST:  CTA CHEST 01/26/2018, report on care everywhere, no images available for my personal interpretation and review  COMPARISON: CTA chest 12/21/2017.   FINDINGS:   No evidence of acute pulmonary embolism. No thoracic aortic aneurysm or   dissection.   The heart is normal in size without pericardial effusion. No pathologically   enlarged lymph nodes in the chest.   Elevation of the left hemidiaphragm with likely left basilar atelectasis. No   pneumothorax, pleural effusion, or consolidation. Central airways are patent.   Nonobstructing left nephrolithiasis. Upper abdomen otherwise demonstrates no   acute pathology.   Sternum is intact. Thoracic vertebral body heights are preserved. No acute   displaced rib fracture identified. No suspicious bony lesions.   IMPRESSION:   1. No evidence of acute pulmonary embolism.   2. Elevation of the left hemidiaphragm with likely left basilar atelectasis. No pneumothorax, pleural effusion, or consolidation.   3. Nonobstructing left nephrolithiasis.     PULMONARY FUNCTION TESTING  Spirometry 01/28/2018      Data:   Spirometry: The FVC is 3.6 L or 81% predicted which is normal. The FEV1 is 2.36 L or 71% predicted which is mildly reduced. The FEV1 to FVC ratio is 0.66 which is low. MVV is 82 L/min which is what is expected for this FEV1.   Interpretation: There is mild airflow obstruction. There is no previous spirometry to compare this to.     Total time spent on encounter was 50 minutes. In addition to face to face time with the patient performing history and exam, this includes time spent before and after the visit reviewing  records, personally reviewing prior imaging, reviewing prior cardiac studies as available, reviewing prior outpatient notes as available, reviewing the patient's medication record, coordinating with office staff and time spent performing final documentation in the medical record.    Stefan Church, MD

## 2021-01-24 NOTE — Nursing Note (Addendum)
01/24/21 1200   Situation   Sitting and Reading 2   Watching TV 2   Sitting inactive in a public place. 1   As a passenger in a car for an hour without a break. 2   Lying down to rest in the afternoon when circumstances permit. 2   Sitting and Talking to someone 0   Sitting quietly after a lunch without alcohol 1   In a car, while stopped for a few minutes in traffic 0   Epworth Sleepiness Scale Score   Score total 10   neck circ- 18.5 inches

## 2021-02-05 ENCOUNTER — Emergency Department (HOSPITAL_COMMUNITY): Payer: No Typology Code available for payment source

## 2021-02-05 ENCOUNTER — Encounter (HOSPITAL_COMMUNITY): Payer: Self-pay

## 2021-02-05 ENCOUNTER — Emergency Department
Admission: EM | Admit: 2021-02-05 | Discharge: 2021-02-05 | Disposition: A | Discharge status: Home / Self Care | Payer: No Typology Code available for payment source | Attending: Emergency Medicine | Admitting: Emergency Medicine

## 2021-02-05 ENCOUNTER — Other Ambulatory Visit: Payer: Self-pay

## 2021-02-05 ENCOUNTER — Ambulatory Visit (HOSPITAL_COMMUNITY): Payer: Self-pay | Admitting: Family Medicine

## 2021-02-05 DIAGNOSIS — R079 Chest pain, unspecified: Secondary | ICD-10-CM

## 2021-02-05 DIAGNOSIS — Z720 Tobacco use: Secondary | ICD-10-CM | POA: Insufficient documentation

## 2021-02-05 DIAGNOSIS — J449 Chronic obstructive pulmonary disease, unspecified: Secondary | ICD-10-CM

## 2021-02-05 DIAGNOSIS — Z7901 Long term (current) use of anticoagulants: Secondary | ICD-10-CM

## 2021-02-05 DIAGNOSIS — Z87891 Personal history of nicotine dependence: Secondary | ICD-10-CM

## 2021-02-05 DIAGNOSIS — M25511 Pain in right shoulder: Secondary | ICD-10-CM

## 2021-02-05 DIAGNOSIS — I4892 Unspecified atrial flutter: Secondary | ICD-10-CM

## 2021-02-05 DIAGNOSIS — G8929 Other chronic pain: Secondary | ICD-10-CM | POA: Insufficient documentation

## 2021-02-05 DIAGNOSIS — M898X1 Other specified disorders of bone, shoulder: Secondary | ICD-10-CM | POA: Insufficient documentation

## 2021-02-05 LAB — PT/INR
INR: 1.14 — ABNORMAL HIGH (ref 0.90–1.10)
PROTHROMBIN TIME: 11.5 seconds (ref 9.0–13.0)

## 2021-02-05 LAB — PTT (PARTIAL THROMBOPLASTIN TIME): APTT: 30.6 seconds (ref 23.0–32.0)

## 2021-02-05 LAB — COMPREHENSIVE METABOLIC PANEL, NON-FASTING
ALBUMIN/GLOBULIN RATIO: 2.2 (ref >=1.0)
ALBUMIN: 4.3 g/dL (ref 3.5–5.2)
ALKALINE PHOSPHATASE: 88 U/L (ref 41–133)
ALT (SGPT): 16 U/L (ref 0–55)
ANION GAP: 9 mmol/L (ref 6–15)
AST (SGOT): 16 U/L (ref 5–34)
BILIRUBIN TOTAL: 1.1 mg/dL (ref 0.2–1.2)
BUN: 17 mg/dL (ref 7–21)
CALCIUM: 9 mg/dL (ref 8.0–10.6)
CHLORIDE: 104 mmol/L (ref 98–107)
CO2 TOTAL: 28 mmol/L (ref 21–32)
CREATININE: 0.96 mg/dL (ref 0.80–1.60)
ESTIMATED GFR: >60 mL/min/{1.73_m2}
GLUCOSE: 96 mg/dL (ref 70–100)
POTASSIUM: 3.9 mmol/L (ref 3.3–5.1)
PROTEIN TOTAL: 6.3 g/dL — ABNORMAL LOW (ref 6.4–8.3)
SODIUM: 141 mmol/L (ref 136–146)

## 2021-02-05 LAB — ECG 12 LEAD
Atrial Rate: 192 {beats}/min
Calculated R Axis: -67 degrees
Calculated T Axis: 41 degrees
QRS Duration: 104 ms
QT Interval: 422 ms
QTC Calculation: 464 ms
Ventricular rate: 73 {beats}/min

## 2021-02-05 LAB — CBC WITH DIFF
BASOPHIL #: <0.1 10*3/uL (ref <=0.20)
BASOPHIL %: 1 %
EOSINOPHIL #: 0.21 10*3/uL (ref <=0.50)
EOSINOPHIL %: 3 %
HCT: 46.5 % (ref 38.9–52.0)
HGB: 15.2 g/dL (ref 13.4–17.5)
IMMATURE GRANULOCYTE #: <0.1 10*3/uL (ref <0.10)
IMMATURE GRANULOCYTE %: 0 % (ref 0–1)
LYMPHOCYTE #: 1.77 10*3/uL (ref 1.00–4.80)
LYMPHOCYTE %: 25 %
MCH: 30.8 pg (ref 26.0–32.0)
MCHC: 32.7 g/dL (ref 31.0–35.5)
MCV: 94.3 fL (ref 78.0–100.0)
MONOCYTE #: 0.57 10*3/uL (ref 0.20–1.10)
MONOCYTE %: 8 %
MPV: 10.2 fL (ref 8.7–12.5)
NEUTROPHIL #: 4.55 10*3/uL (ref 1.50–7.70)
NEUTROPHIL %: 63 %
PLATELETS: 185 10*3/uL (ref 150–400)
RBC: 4.93 10*6/uL (ref 4.50–6.10)
RDW-CV: 13.1 % (ref 11.5–15.5)
WBC: 7.2 10*3/uL (ref 3.7–11.0)

## 2021-02-05 LAB — TROPONIN I, HIGH SENSITIVITY: TROPONIN I, HIGH SENSITIVITY: 6.3 pg/mL (ref 2.00–20.00)

## 2021-02-05 LAB — D-DIMER: D-DIMER: 0.31 mg/L FEU (ref <0.50)

## 2021-02-05 LAB — MAGNESIUM: MAGNESIUM: 2 mg/dL (ref 1.5–2.5)

## 2021-02-05 LAB — RED TOP TUBE

## 2021-02-05 LAB — CREATINE KINASE (CK), TOTAL, SERUM: CREATINE KINASE: 110 U/L (ref 30–200)

## 2021-02-05 MED ORDER — MORPHINE 4 MG/ML INJECTION SYRINGE
4.0000 mg | INJECTION | Freq: Once | INTRAMUSCULAR | Status: AC
Start: 2021-02-05 — End: 2021-02-05
  Administered 2021-02-05: 4 mg via INTRAVENOUS
  Filled 2021-02-05: qty 1

## 2021-02-05 NOTE — ED Triage Notes (Signed)
RIGHT SHOULDER BLADE PAIN , STARTED YESTERDAY AM  AROUND 10  THIS IS WHERE His ANGINA PAIN OCCURS TOOK NITRO YESTERDAY , WOKE UP PAIN STILL A 4/5 .  FOLLOWS DR [facility] OFFICE .

## 2021-02-05 NOTE — ED Provider Notes (Signed)
[facility] - Emergency Department      Attending Physician: Dr. Hetty Blend  CC:  Chief Complaint   Patient presents with   . Shoulder Pain   . Heart Problem       HPI:  Thomas [name] is a 75 y.o. male who presents to the Emergency Room with c/o pain in his right scapula since yesterday morning.  Patient said that this is the this site where he gets the pain and problem is angina.  He said he took nitro yesterday at been eased up but then it came back.  He said he is slightly short of breath which is due to COPD.  He denies any sweating or dizziness or lightheadedness.  He has a history of for atrial flutter and he is on Eliquis.  He has had it would never told that he has good knee disease.  He had a stress test 4 years ago which was told normal.    Review of Systems:  Constitutional:  Constitutional symptoms negative except what describe in history.  Skin:  Negative except what Idescribed in his history.  HENT:Negative except what I described in his history.  Eyes: Negative except what a described in his history.  Cardio: Negative except what I described in his history.  Respiratory:Negative except what I described in his history.  GI: Negative except what I a described in his history.  GU: Negative except what I described in his history.  MSK: Negative except what I described in his history.  Neuro: Negative except what I described in his history.   All other systems reviewed and are negative or as noted in HPI.    History:  PMH:    Past Medical History:   Diagnosis Date   . Arthritis    . Dysuria    . Enteritis due to Rotavirus    . Gout    . Headache    . Hemorrhoid    . Hypercholesterolemia    . Hypertension          Previous Medications    ACETAMINOPHEN (TYLENOL) 500 MG ORAL TABLET    Take 500 mg by mouth Every 4 hours as needed for Pain (pt takes tylenol pm)    ALBUTEROL SULFATE (VENTOLIN HFA) 90 MCG/ACTUATION INHALATION HFA AEROSOL INHALER    Take 1-2 Puffs by inhalation Every 6 hours as needed    ANORO  ELLIPTA 62.5-25 MCG/ACTUATION INHALATION DISK WITH DEVICE    inhale ONE DOSE EVERY DAY AS DIRECTED    APIXABAN (ELIQUIS) 5 MG ORAL TABLET    Take 5 mg by mouth    DILTIAZEM (CARDIZEM CD) 240 MG ORAL CAPSULE, SUST. RELEASE 24 HR    TAKE ONE CAPSULE BY MOUTH EVERY DAY    FLECAINIDE (TAMBOCOR) 100 MG ORAL TABLET    TAKE ONE TABLET BY MOUTH TWICE DAILY    FUROSEMIDE (LASIX) 40 MG ORAL TABLET    TAKE ONE TABLET BY MOUTH EVERY DAY    HYDRALAZINE (APRESOLINE) 10 MG ORAL TABLET    Take 1 Tablet (10 mg total) by mouth Three times a day    LISINOPRIL (PRINIVIL) 40 MG ORAL TABLET    Take 1 Tablet (40 mg total) by mouth Once a day    NITROGLYCERIN (NITROSTAT) 0.4 MG SUBLINGUAL TABLET, SUBLINGUAL    Place 1 Tablet (0.4 mg total) under the tongue Every 5 minutes as needed for Chest pain    POTASSIUM CHLORIDE (KLOR-CON) 10 MEQ ORAL TABLET SUSTAINED RELEASE  TAKE ONE TABLET BY MOUTH EVERY DAY    SIMVASTATIN (ZOCOR) 20 MG ORAL TABLET    Take 1 Tablet (20 mg total) by mouth Every night    TIOTROPIUM BROMIDE (SPIRIVA HANDIHALER) 18 MCG INHALATION CAPSULE, W/INHALATION DEVICE    Take 1 Capsule (18 mcg total) by inhalation Once a day for 30 days       PSH:    Past Surgical History:   Procedure Laterality Date   . HX HEMORRHOIDECTOMY           Social Hx:    Social History     Socioeconomic History   . Marital status: Widowed     Spouse name: Not on file   . Number of children: Not on file   . Years of education: Not on file   . Highest education level: Not on file   Occupational History   . Not on file   Tobacco Use   . Smoking status: Former Smoker     Years: 20.00     Types: Cigars   . Smokeless tobacco: Current User     Types: Chew, Snuff   Vaping Use   . Vaping Use: Never used   Substance and Sexual Activity   . Alcohol use: Yes     Comment: occasional   . Drug use: Never   . Sexual activity: Not Currently   Other Topics Concern   . Not on file   Social History Narrative   . Not on file     Social Determinants of Health      Financial Resource Strain: Not on file   Food Insecurity: Not on file   Transportation Needs: Not on file   Physical Activity: Not on file   Stress: Not on file   Intimate Partner Violence: Not on file   Housing Stability: Not on file     Family Hx:   Family History   Problem Relation Age of Onset   . Hypertension (High Blood Pressure) Mother    . Cancer Mother    . Hypertension (High Blood Pressure) Father      Allergies: No Known Allergies    Above history reviewed with patient, changes are as documented.    Physical Exam:   Nursing note and vitals reviewed.  ED Triage Vitals [02/05/21 0948]   BP (Non-Invasive) (!) 142/93   Heart Rate 81   Respiratory Rate 18   Temperature 36.1 C (97 F)   SpO2 96 %   Weight 119 kg (262 lb)   Height 1.803 m (5\' 11" )       Constitutional: Pt is alert and oriented and appears well-developed and well-nourished. No acute distress.   Head.  Atraumatic.  Pupils.  Equal and reacting to light.  HENT:   Mouth/Throat: Oropharynx is clear and moist.  Tonsils not enlarged no inflammation.  Eyes: Conjunctivae and extraocular motions are normal. Pupils are equal, round, and reactive to light.   Neck: Normal range of motion. Neck supple.  No JVDs or bruits trachea midline  Cardiovascular:  Heart S1-S2 regular no gallop or murmur.  Normal rate, regular rhythm and intact distal pulses.    Pulmonary/Chest: Effort normal. No respiratory distress. Breath sounds normal.  Lungs are clear no rales rhonchi or wheezing.  Abdominal: Abdomen is soft, nontender, nondistended.  No rebound or guarding noted.  Bowel sounds are present.  Liver spleen and kidneys are not palpable.  Musculoskeletal: Normal range of motion.  Examination of lower extremities shows no  edema.  Neurological: Pt is alert and oriented. Grossly intact. Moving all extremities. Facies symmetric.  No motor or sensory deficits.  Skin: Skin is warm and dry without rash.  Color normal.      Course:   Impression: Pt presenting c/o   Chief  Complaint   Patient presents with   . Shoulder Pain   . Heart Problem       Plan:Will obtain the following labs/imaging and give patient the following medications to alleviate symptoms:  Orders Placed This Encounter   . XR AP MOBILE CHEST   . CBC/DIFF   . COMPREHENSIVE METABOLIC PANEL, NON-FASTING   . CREATINE KINASE (CK), TOTAL, SERUM   . MAGNESIUM   . PT/INR   . PTT (PARTIAL THROMBOPLASTIN TIME)   . TROPONIN I, HIGH SENSITIVITY   . CBC WITH DIFF   . EXTRA TUBES   . RED TOP TUBE   . CANCELED: D-DIMER   . D-DIMER   . PULSE OXIMETRY - CONTINUOUS   . ECG 12 LEAD   . INSERT & MAINTAIN PERIPHERAL IV ACCESS   . morphine 4 mg/mL injection     Laboratory Results:    Results for orders placed or performed during the hospital encounter of 02/05/21 (from the past 12 hour(s))   COMPREHENSIVE METABOLIC PANEL, NON-FASTING   Result Value Ref Range    SODIUM 141 136 - 146 mmol/L    POTASSIUM 3.9 3.3 - 5.1 mmol/L    CHLORIDE 104 98 - 107 mmol/L    CO2 TOTAL 28 21 - 32 mmol/L    ANION GAP 9 6 - 15 mmol/L    BUN 17 7 - 21 mg/dL    CREATININE 1.610.96 0.960.80 - 1.60 mg/dL    ESTIMATED GFR >04>60 VW/UJW/1.19J^4mL/min/1.78m^2    ALBUMIN 4.3 3.5 - 5.2 g/dL    CALCIUM 9.0 8.0 - 78.210.6 mg/dL    GLUCOSE 96 70 - 956100 mg/dL    ALKALINE PHOSPHATASE 88 41 - 133 U/L    ALT (SGPT) 16 0 - 55 U/L    AST (SGOT) 16 5 - 34 U/L    BILIRUBIN TOTAL 1.1 0.2 - 1.2 mg/dL    PROTEIN TOTAL 6.3 (L) 6.4 - 8.3 g/dL    ALBUMIN/GLOBULIN RATIO 2.2 >=1.0   CREATINE KINASE (CK), TOTAL, SERUM   Result Value Ref Range    CREATINE KINASE 110 30 - 200 U/L   MAGNESIUM   Result Value Ref Range    MAGNESIUM 2.0 1.5 - 2.5 mg/dL   PT/INR   Result Value Ref Range    PROTHROMBIN TIME 11.5 9.0 - 13.0 seconds    INR 1.14 (H) 0.90 - 1.10   PTT (PARTIAL THROMBOPLASTIN TIME)   Result Value Ref Range    APTT 30.6 23.0 - 32.0 seconds   D-DIMER   Result Value Ref Range    D-DIMER 0.31 <0.50 mg/L FEU   TROPONIN I, HIGH SENSITIVITY   Result Value Ref Range    TROPONIN I, HIGH SENSITIVITY 6.30 2.00 - 20.00 pg/mL    CBC WITH DIFF   Result Value Ref Range    WBC 7.2 3.7 - 11.0 x10^3/uL    RBC 4.93 4.50 - 6.10 x10^6/uL    HGB 15.2 13.4 - 17.5 g/dL    HCT 21.346.5 08.638.9 - 57.852.0 %    MCV 94.3 78.0 - 100.0 fL    MCH 30.8 26.0 - 32.0 pg    MCHC 32.7 31.0 - 35.5 g/dL    RDW-CV 46.913.1 62.911.5 -  15.5 %    PLATELETS 185 150 - 400 x10^3/uL    MPV 10.2 8.7 - 12.5 fL    NEUTROPHIL % 63 %    LYMPHOCYTE % 25 %    MONOCYTE % 8 %    EOSINOPHIL % 3 %    BASOPHIL % 1 %    NEUTROPHIL # 4.55 1.50 - 7.70 x10^3/uL    LYMPHOCYTE # 1.77 1.00 - 4.80 x10^3/uL    MONOCYTE # 0.57 0.20 - 1.10 x10^3/uL    EOSINOPHIL # 0.21 <=0.50 x10^3/uL    BASOPHIL # <0.10 <=0.20 x10^3/uL    IMMATURE GRANULOCYTE % 0 0 - 1 %    IMMATURE GRANULOCYTE # <0.10 <0.10 x10^3/uL        Radiographical Imaging:   Results for orders placed or performed during the hospital encounter of 02/05/21   XR AP MOBILE CHEST     Status: None    Narrative    INDICATION:  General chest pain.    TECHNIQUE:  Frontal chest was obtained at 1021 hours.    COMPARISON:  Chest Radiograph 07/14/2018, CT scan of 08/22/2018.    FINDINGS:    The cardiomediastinal silhouette is normal in size. Telemetry wires overlie the chest.  Again seen, is elevation of the left hemidiaphragm. There is no consolidation or atelectasis in either lung.  There are no pleural effusions.  There is no pneumothorax.  No acute osseous process.        Impression    Right costophrenic angle is collimated outside the field-of-view.    Otherwise, No acute pulmonary disease.  Stable elevation of the left hemidiaphragm.      Signed by Hedwig Morton       EKG- atrial flutter with controlled rate of 73 per minute with left axis deviation and low voltage QRS be due to old inferior wall MI changes as well as anterior wall MI changes.      All labs were reviewed. Medical Records reviewed.     MDM/Course:  Visit Vitals  Vitals:    02/05/21 0948 02/05/21 1300   BP: (!) 142/93 (!) 138/91   Pulse: 81 66   Resp: 18 16   Temp: 36.1 C (97 F)    SpO2: 96%  96%   Weight: 119 kg (262 lb)    Height: 1.803 m (5\' 11" )    BMI: 36.62               Disposition: Discharged    Clinical Impression:   Encounter Diagnosis   Name Primary?   . Chronic scapular pain Yes     Follow Up: , DO  [address]  [name]  [phone]    In 3 days      Medications Prescribed:   New Prescriptions    No medications on file   Plan and treatment.  Patient has almost no pain now.  His pain is in right scapular area.  I discussed results with him and he was discharged home to follow his doctor.  Patient was told to take some Tylenol.      [phone], MD 02/05/2021, 13:12

## 2021-02-05 NOTE — Progress Notes (Signed)
Pt called PCP to report angina pain , no triage nurse answered so pt was transferred to the NN line. He states that pain began yesterday morning in his back right shoulder blade. Pt states that historically, this is where his cardiac related pain always defers to. He denies pain radiating to neck , arm or jaw. Pt denies worsening sob and states sob is at baseline. Reports he has had 3 episodes of dizziness over the last few months but none during the last 24 hrs. Pt denies confusion, slurred speech. Reports cold sweats yesterday when the pain began, states that pain was a 10/10 and he took 2 nitro 5 mins apart and took a nap. Upon waking, pain was down to a 4/10. This morning the pain is back at a 9-10/10.BP  141/92 this morning. 123/84 yesterday morning when pain began then, 98/68 after nitro. HR is normally in the 90s, 72 after nitro. Pt reports he is taking all meds as ordered. No new meds as per pt. Pt denies any palpitations or actual pain in his chest, arms, jaw or neck. NN advised as per protocol that he needs to go to ED for assessment. Patient verbalized understanding, and states he has someone there to take him. London Pepper, RN  02/05/2021, 08:42  '

## 2021-02-05 NOTE — ED Nurses Note (Signed)
Patient discharged home with family.  AVS reviewed with patient/care giver.  A written copy of the AVS and discharge instructions was given to the patient/care giver.  Questions sufficiently answered as needed.  Patient/care giver encouraged to follow up with PCP as indicated.  In the event of an emergency, patient/care giver instructed to call 911 or go to the nearest emergency room.

## 2021-02-06 ENCOUNTER — Other Ambulatory Visit (INDEPENDENT_AMBULATORY_CARE_PROVIDER_SITE_OTHER): Payer: Self-pay | Admitting: Medical

## 2021-02-06 NOTE — Telephone Encounter (Signed)
Reviewed active medications.  Parameters for refill met per departmental refill protocol . Refill sent electronically to pharmacy.  Order forwarded to provider for [facility]

## 2021-02-07 DIAGNOSIS — L570 Actinic keratosis: Secondary | ICD-10-CM | POA: Diagnosis not present

## 2021-02-07 DIAGNOSIS — I872 Venous insufficiency (chronic) (peripheral): Secondary | ICD-10-CM | POA: Diagnosis not present

## 2021-02-07 DIAGNOSIS — X32XXXA Exposure to sunlight, initial encounter: Secondary | ICD-10-CM | POA: Diagnosis not present

## 2021-02-07 DIAGNOSIS — L7211 Pilar cyst: Secondary | ICD-10-CM | POA: Diagnosis not present

## 2021-02-26 ENCOUNTER — Encounter (HOSPITAL_COMMUNITY): Payer: Self-pay

## 2021-02-27 DIAGNOSIS — L7211 Pilar cyst: Secondary | ICD-10-CM | POA: Diagnosis not present

## 2021-03-04 DIAGNOSIS — Z471 Aftercare following joint replacement surgery: Secondary | ICD-10-CM | POA: Diagnosis not present

## 2021-03-04 DIAGNOSIS — Z96652 Presence of left artificial knee joint: Secondary | ICD-10-CM | POA: Diagnosis not present

## 2021-03-13 ENCOUNTER — Inpatient Hospital Stay
Admission: RE | Admit: 2021-03-13 | Discharge: 2021-03-13 | Disposition: A | Discharge status: Home / Self Care | Payer: No Typology Code available for payment source | Source: Ambulatory Visit | Attending: Student in an Organized Health Care Education/Training Program | Admitting: Student in an Organized Health Care Education/Training Program

## 2021-03-13 ENCOUNTER — Other Ambulatory Visit: Payer: Self-pay

## 2021-03-13 DIAGNOSIS — J449 Chronic obstructive pulmonary disease, unspecified: Secondary | ICD-10-CM | POA: Insufficient documentation

## 2021-03-13 DIAGNOSIS — R942 Abnormal results of pulmonary function studies: Secondary | ICD-10-CM

## 2021-03-13 MED ORDER — ALBUTEROL SULFATE 2.5 MG/3 ML (0.083 %) SOLUTION FOR NEBULIZATION
2.5000 mg | INHALATION_SOLUTION | RESPIRATORY_TRACT | Status: AC
Start: 2021-03-13 — End: 2021-03-13
  Administered 2021-03-13: 2.5 mg via RESPIRATORY_TRACT

## 2021-04-01 ENCOUNTER — Encounter (INDEPENDENT_AMBULATORY_CARE_PROVIDER_SITE_OTHER): Payer: Self-pay | Admitting: Family Medicine

## 2021-04-01 ENCOUNTER — Other Ambulatory Visit: Payer: Self-pay

## 2021-04-01 ENCOUNTER — Ambulatory Visit (INDEPENDENT_AMBULATORY_CARE_PROVIDER_SITE_OTHER): Payer: No Typology Code available for payment source | Admitting: Family Medicine

## 2021-04-01 VITALS — BP 128/86 | HR 84 | Ht 71.0 in | Wt 270.2 lb

## 2021-04-01 DIAGNOSIS — E782 Mixed hyperlipidemia: Secondary | ICD-10-CM

## 2021-04-01 DIAGNOSIS — J449 Chronic obstructive pulmonary disease, unspecified: Secondary | ICD-10-CM

## 2021-04-01 DIAGNOSIS — I4892 Unspecified atrial flutter: Secondary | ICD-10-CM

## 2021-04-01 DIAGNOSIS — Z1159 Encounter for screening for other viral diseases: Secondary | ICD-10-CM

## 2021-04-01 DIAGNOSIS — I10 Essential (primary) hypertension: Secondary | ICD-10-CM

## 2021-04-01 DIAGNOSIS — H6123 Impacted cerumen, bilateral: Secondary | ICD-10-CM

## 2021-04-01 DIAGNOSIS — Z23 Encounter for immunization: Secondary | ICD-10-CM

## 2021-04-01 NOTE — Nursing Note (Signed)
Patient is here for a 6 month follow up.

## 2021-04-01 NOTE — Progress Notes (Signed)
Thomas [name] is a 75 year old male who presents today for a six-month follow-up of hypertension and hyperlipidemia and chronic medical problems.  The patient reports current adherence to recommended diet is good. The pt is compliant with medicine.  He is complaining today that he thinks his ears need to be flushed.  He can not seem to get the water out of his ears when he takes a shower.  He feels like they are clogged.  He is not having any difficulty hearing he is not complaining of any pain in his ears.  Regarding HTN: The following symptoms are also present: none, and he also denies the following symptoms: headache, blurred vision, chest discomfort, shortness of breath and palpitations.  Side effects of medication:none.    Regarding lipid treatment: He is taking Zocor and is having no side effects.    Lab Results   Component Value Date    CHOLESTEROL 126 01/24/2021    HDLCHOL 49 01/24/2021    LDLCHOL 62 01/24/2021    TRIG 77 01/24/2021      He does have a history of atrial flutter and has follow-up with cardiology. He continues on his Eliquis as his anticoagulant and it seems to be working well.  He is on a calcium channel blocker for rate control. He denies having any bleeding.  He denies any palpitations, lightheadedness, dizziness or chest pain.  Apparently the last time he was at his cardiologist they felt that his Anoro could be increasing his blood pressure so they told him to speak with his pulmonologist about this.  The pulmonologist does treat him for COPD and also his Anoro to Spiriva.  The patient states that he was not sure if he was supposed to be taking both are not because she did stop the Anoro.  He admitted he actually stopped the Anoro on his own and then noticed high blood pressure readings after starting the Spiriva so he decided to stop it as well and now he admits that his Breo without either of the inhalers.  He never filled the prescription for rescue inhaler.  He denies any pulmonary  symptoms at this time.  He is not a smoker.    Most recent Cologuard 10/31/2020, negative    REVIEW OF SYSTEMS:  GENERAL: negative for fevers/chills, fatigue, significant weight change, sleep disturbance  HEENT:denies visual changes, sore throat or URI symptoms  RESPIRATORY:shortness of breath as above, no cough, or wheeze  CARDIAC:  Chest pains and shortness of breath as above  GI: no nausea/vomitting/diarrhea, no abdominal pain  MUSCULOSKELETAL: no myalgias or arthragias, no recent trauma, no edema   NEUROLOGIC: no headache, visual changes or mental status changes  GU: no urinary symptoms or CVA tenderness      No Known Allergies  Current Outpatient Medications   Medication Sig   . acetaminophen (TYLENOL) 500 mg Oral Tablet Take 500 mg by mouth Every 4 hours as needed for Pain (pt takes tylenol pm)   . apixaban (ELIQUIS) 5 mg Oral Tablet Take 5 mg by mouth   . dilTIAZem (CARDIZEM CD) 240 mg Oral Capsule, Sust. Release 24 hr TAKE ONE CAPSULE BY MOUTH EVERY DAY   . flecainide (TAMBOCOR) 100 mg Oral Tablet TAKE ONE TABLET BY MOUTH TWICE DAILY   . furosemide (LASIX) 40 mg Oral Tablet TAKE ONE TABLET BY MOUTH EVERY DAY   . hydrALAZINE (APRESOLINE) 10 mg Oral Tablet TAKE ONE TABLET BY MOUTH THREE TIMES DAILY   . lisinopriL (PRINIVIL) 40 mg Oral  Tablet Take 1 Tablet (40 mg total) by mouth Once a day   . nitroGLYCERIN (NITROSTAT) 0.4 mg Sublingual Tablet, Sublingual Place 1 Tablet (0.4 mg total) under the tongue Every 5 minutes as needed for Chest pain   . potassium chloride (KLOR-CON) 10 mEq Oral Tablet Sustained Release TAKE ONE TABLET BY MOUTH EVERY DAY   . simvastatin (ZOCOR) 20 mg Oral Tablet Take 1 Tablet (20 mg total) by mouth Every night     Past Medical History:   Diagnosis Date   . Arthritis    . Dysuria    . Enteritis due to Rotavirus    . Gout    . Headache    . Hemorrhoid    . Hypercholesterolemia    . Hypertension          Past Surgical History:   Procedure Laterality Date   . HX HEMORRHOIDECTOMY            Social History     Tobacco Use   . Smoking status: Former     Types: Cigars   . Smokeless tobacco: Current     Types: Chew, Snuff   Substance Use Topics   . Alcohol use: Yes     Comment: occasional        PHYSICAL EXAM:   The patient appears to be in no acute distress.  Vitals: BP 128/86   Pulse 84   Ht 1.803 m (5\' 11" )   Wt 123 kg (270 lb 3.2 oz)   SpO2 97%   BMI 37.69 kg/m    HEENT:  He does open and packed right, on the left he does have some cerumen, however is not completely blocking the external auditory canal.  Respiratory: clear to ascultation B/L, no respiratory distress, equal BS throughout, no bibasilar rales  Heart: normal sinus rhythm today, no murmur, no edema, normal peripheral pulses  Abdomen: soft, nontender, positive bowel sounds  Extremities: no edema, no calf pain or tenderness  Neuro: AAOx3, cranial nerves's intact, DTR's 2/4 throughout  Skin: warm and dry, no rashes or lesions    ASSESSMENT:     ICD-10-CM    1. Paroxysmal atrial flutter (CMS HCC)  I48.92 Continue anticoagulation with Eliquis.  Continue rate-controlling medications.      2. Encounter for hepatitis C screening test for low risk patient  Z11.59 Hepatitis C Antibody Screen with Reflex to HCV PCR      3. Essential hypertension  I10 LIPID PANEL-the blood pressure well controlled continue current regimen     CBC/DIFF     Comp Metabolic Panel-Fasting      4. Mixed hyperlipidemia  E78.2 LIPID PANEL-lipids are controlled.  Continue current been      5. Chronic obstructive pulmonary disease, unspecified COPD type (CMS HCC)  J44.9 He did stop all of his inhalers and admits that he is breathing better without them.  He will follow-up with pulmonology.      6. Bilateral impacted cerumen  H61.23 He is going to try the over-the-counter earwax removal drops and this is not helping he will let know so we can get to ENT.        PLAN:   I reviewed appropriate screenings and HCM with patient.  He was given a flu shot and COVID-19  booster today.  Orders Placed This Encounter   . Covid-19 Vaccine,Pfizer,34mcg/.3ml,BIVALENT Booster Dose(12 yr+)   . Flu Vaccine, 6 month-adult,0.5 mL IM (Admin)   . Hepatitis C Antibody Screen with Reflex to HCV  PCR   . LIPID PANEL   . CBC/DIFF   . Comp Metabolic Panel-Fasting     Medication: continue current medication regimen unchanged  Return in about 6 months (around 09/30/2021), or and medicare wellness at his conveinence, for blood work first.

## 2021-04-12 NOTE — Procedures (Signed)
[place] [facility]IC[facility]  [facility]ated by [facility]  [address]  [place] [address]  Dept: [phone]  Dept Fax: [phone]             PULMONARY FUNCTION TESTING    RESULTS:  FEV1/FVC ratio is normal at 71%.  FEV1 is normal at 84% of predicted.  FVC is normal at 88% predicted.  Following albuterol, there is no significant improvement in spirometry.      Total lung capacity is normal at 116% of predicted.  Residual volume is increased at 155% of predicted.  Slow vital capacity is normal at 94% of predicted.  Diffusion capacity is reduced at 67% of predicted.  Flow volume loop is obstructed in appearance.    IMPRESSION:  Normal spirometry.    No significant improvement in spirometry following albuterol   Air trapping as indicated by increased residual volume 155% predicted.  Decreased diffusion capacity at 67% predicted, with no significant correction when considering alveolar volume     Though spirometry component is normal, the patient's lung volumes indicate air trapping, diffusion capacity is decreased and flow volume lopo is obstructed appearance.  This pattern suggests an underlying obstructive pulmonary limitation.    Further clinical and radiographic correlation is advised.      Cyril Loosen, MD  04/12/2021

## 2021-04-13 ENCOUNTER — Other Ambulatory Visit (INDEPENDENT_AMBULATORY_CARE_PROVIDER_SITE_OTHER): Payer: Self-pay | Admitting: Family Medicine

## 2021-04-15 ENCOUNTER — Telehealth: Payer: Self-pay | Admitting: Pharmacist

## 2021-04-15 ENCOUNTER — Encounter (INDEPENDENT_AMBULATORY_CARE_PROVIDER_SITE_OTHER): Payer: Self-pay | Admitting: PHYSICIAN ASSISTANT

## 2021-04-15 ENCOUNTER — Ambulatory Visit: Payer: No Typology Code available for payment source | Attending: PHYSICIAN ASSISTANT | Admitting: PHYSICIAN ASSISTANT

## 2021-04-15 ENCOUNTER — Other Ambulatory Visit: Payer: Self-pay

## 2021-04-15 ENCOUNTER — Telehealth: Payer: Medicare HMO

## 2021-04-15 VITALS — BP 148/80 | HR 67 | Ht 71.0 in | Wt 269.0 lb

## 2021-04-15 DIAGNOSIS — I4460 Unspecified fascicular block: Secondary | ICD-10-CM | POA: Insufficient documentation

## 2021-04-15 DIAGNOSIS — I440 Atrioventricular block, first degree: Secondary | ICD-10-CM

## 2021-04-15 DIAGNOSIS — R9431 Abnormal electrocardiogram [ECG] [EKG]: Secondary | ICD-10-CM

## 2021-04-15 DIAGNOSIS — E782 Mixed hyperlipidemia: Secondary | ICD-10-CM | POA: Insufficient documentation

## 2021-04-15 DIAGNOSIS — I10 Essential (primary) hypertension: Secondary | ICD-10-CM

## 2021-04-15 DIAGNOSIS — I4892 Unspecified atrial flutter: Secondary | ICD-10-CM | POA: Insufficient documentation

## 2021-04-15 DIAGNOSIS — I454 Nonspecific intraventricular block: Secondary | ICD-10-CM

## 2021-04-15 DIAGNOSIS — I491 Atrial premature depolarization: Secondary | ICD-10-CM

## 2021-04-15 NOTE — Progress Notes (Deleted)
Chronic Care Management Pharmacy Note  04/15/2021 Name:  Tanner [name] MRN:  [id number] DOB:  [birth year]  Summary: ***  Recommendations/Changes made from today's visit: ***  Plan: ***  Subjective: Tanner [name] is an 75 y.o. year old male who is a primary patient of Hoyt Koch, MD.  The CCM team was consulted for assistance with disease management and care coordination needs.    Engaged with patient by telephone for follow up visit in response to provider referral for pharmacy case management and/or care coordination services.   Consent to Services:  The patient was given information about Chronic Care Management services, agreed to services, and gave verbal consent prior to initiation of services.  Please see initial visit note for detailed documentation.   Patient Care Team: Hoyt Koch, MD as PCP - General (Internal Medicine) Charlton Haws, [facility] as Pharmacist (Pharmacist)  Recent office visits: 01/13/21 Dr Sharlet Salina OV: f/u DM; wt up 10 lbs since stopping Ozempic; hold atenolol d/t possible ED, monitor BP; A1c 5.6, f/u 6 months - could change to hx DM; no ACE/ARB d/t AKI after  12/12/19 Dr Sharlet Salina OV: chronic f/u. Previously taken off metformin for rising creatinine.   Recent consult visits: 01/01/21 Dr Marin Olp (oncology): hx PE; wt up 12 lbs; f/u 6 mos, considering Eliquis dose reduction to 2.5 mg  PT for knee @ [facility].  04/04/20 Dr [name] (weight mgmt): 34 lb wt loss since 10/12/19. Referred to PT.   Hospital visits: 08/26/20 admission for knee replacement  Objective:  Lab Results  Component Value Date   CREATININE 1.57 (H) 01/01/2021   BUN 19 01/01/2021   GFR 48.19 (L) 12/12/2019   GFRNONAA 45 (L) 01/01/2021   GFRAA 55 (L) 01/23/2020   NA 140 01/01/2021   K 4.3 01/01/2021   CALCIUM 9.5 01/01/2021   CO2 30 01/01/2021   GLUCOSE 130 (H) 01/01/2021    Lab Results  Component Value Date/Time   HGBA1C 5.6 01/13/2021  09:23 AM   HGBA1C 5.1 08/20/2020 01:26 PM   HGBA1C 5.4 04/15/2020 12:00 AM   HGBA1C 8.1 (H) 10/12/2019 02:59 PM   GFR 48.19 (L) 12/12/2019 02:43 PM   GFR 63.06 11/29/2017 01:32 PM    Last diabetic Eye exam: No results found for: HMDIABEYEEXA  Last diabetic Foot exam: No results found for: HMDIABFOOTEX   Lab Results  Component Value Date   CHOL 85 04/15/2020   HDL 33 (A) 04/15/2020   LDLCALC 43 04/15/2020   LDLDIRECT 65.9 09/13/2007   TRIG 122 04/15/2020   CHOLHDL 3.6 10/12/2019    Hepatic Function Latest Ref Rng & Units 01/01/2021 07/04/2020 04/15/2020  Total Protein 6.5 - 8.1 g/dL 6.2(L) 6.5 -  Albumin 3.5 - 5.0 g/dL 3.9 4.0 -  AST 15 - 41 U/L 11(L) 28 13(A)  ALT 0 - 44 U/L $Remo'10 26 24  'eiCdk$ Alk Phosphatase 38 - 126 U/L 95 91 -  Total Bilirubin 0.3 - 1.2 mg/dL 0.7 0.5 -  Bilirubin, Direct 0.0 - 0.3 mg/dL - - -    Lab Results  Component Value Date/Time   TSH 6.45 (H) 01/13/2021 09:22 AM   TSH 3.050 10/12/2019 02:59 PM   FREET4 1.35 10/12/2019 02:59 PM    CBC Latest Ref Rng & Units 01/01/2021 08/20/2020 07/04/2020  WBC 4.0 - 10.5 K/uL 7.4 8.8 8.3  Hemoglobin 13.0 - 17.0 g/dL 13.2 15.5 14.6  Hematocrit 39.0 - 52.0 % 42.2 48.8 46.1  Platelets 150 - 400 K/uL 174  174 199    Lab Results  Component Value Date/Time   VD25OH 45.21 01/13/2021 09:22 AM   VD25OH 51 04/15/2020 12:00 AM   VD25OH 32.6 10/12/2019 02:59 PM   VD25OH 29.47 (L) 11/29/2017 01:32 PM    Clinical ASCVD: Yes  The ASCVD Risk score (Arnett DK, et al., 2019) failed to calculate for the following reasons:   The valid total cholesterol range is 130 to 320 mg/dL    Depression screen [facility] 2/9 01/13/2021 10/12/2019 09/24/2016  Decreased Interest 0 3 0  Down, Depressed, Hopeless 0 3 0  PHQ - 2 Score 0 6 0  Altered sleeping 0 0 -  Tired, decreased energy 0 3 -  Change in appetite 0 1 -  Feeling bad or failure about yourself  0 1 -  Trouble concentrating 0 1 -  Moving slowly or fidgety/restless 0 0 -  Suicidal thoughts 0 0  -  PHQ-9 Score 0 12 -  Difficult doing work/chores - Not difficult at all -  Some recent data might be hidden     Social History   Tobacco Use  Smoking Status Former   Packs/day: 2.00   Years: 33.00   Pack years: 66.00   Types: Cigarettes   Start date: 08/30/1968   Quit date: 07/02/2001   Years since quitting: 19.8  Smokeless Tobacco Never  Tobacco Comments   quit 12 years ago   BP Readings from Last 3 Encounters:  01/13/21 132/80  01/01/21 (!) 124/56  08/26/20 132/80   Pulse Readings from Last 3 Encounters:  01/13/21 77  01/01/21 70  08/26/20 86   Wt Readings from Last 3 Encounters:  01/13/21 264 lb 9.6 oz (120 kg)  01/01/21 262 lb (118.8 kg)  08/26/20 242 lb (109.8 kg)   BMI Readings from Last 3 Encounters:  01/13/21 43.36 kg/m  01/01/21 42.94 kg/m  08/26/20 39.66 kg/m    Assessment/Interventions: Review of patient past medical history, allergies, medications, health status, including review of consultants reports, laboratory and other test data, was performed as part of comprehensive evaluation and provision of chronic care management services.   SDOH:  (Social Determinants of Health) assessments and interventions performed: Yes  SDOH Screenings   Alcohol Screen: Not on file  Depression (PHQ2-9): Low Risk    PHQ-2 Score: 0  Financial Resource Strain: Low Risk    Difficulty of Paying Living Expenses: Not very hard  Food Insecurity: Not on file  Housing: Not on file  Physical Activity: Not on file  Social Connections: Not on file  Stress: Not on file  Tobacco Use: Medium Risk   Smoking Tobacco Use: Former   Smokeless Tobacco Use: Never   Passive Exposure: Not on file  Transportation Needs: Not on file    [place]  Allergies  Allergen Reactions   Lisinopril     Other reaction(s): Renal impairment   Amlodipine Swelling   Codeine Sulfate Itching and Nausea Only    Medications Reviewed Today     Reviewed by Hoyt Koch, MD  (Physician) on 01/13/21 at [place] List Status: <None>   Medication Order Taking? Sig Documenting Provider Last Dose Status Informant  acetaminophen (TYLENOL) 500 MG tablet [id number] Yes Take 1,000 mg by mouth every 6 (six) hours as needed for moderate pain. Norins, Heinz Knuckles, MD Taking Active Self  acidophilus (RISAQUAD) CAPS capsule [id number] Yes Take 1 capsule by mouth daily. [provider] Taking Active Self  apixaban (ELIQUIS) 5 MG TABS tablet [id number]  Yes Take 5 mg by mouth 2 (two) times daily. [provider] Taking Active   atenolol (TENORMIN) 25 MG tablet [id number] Yes Take 12.5 mg by mouth daily. [provider] Taking Active Self  atorvastatin (LIPITOR) 80 MG tablet [id number] Yes Take 40 mg by mouth daily. [provider] Taking Active Self  buPROPion (WELLBUTRIN SR) 150 MG 12 hr tablet [id number] Yes Take 150 mg by mouth 2 (two) times daily. [provider] Taking Active Self  finasteride (PROSCAR) 5 MG tablet [id number] Yes Take 5 mg by mouth at bedtime. [provider] Taking Active Self  furosemide (LASIX) 20 MG tablet [id number] Yes Take 2 tablets (40 mg total) by mouth 2 (two) times daily.  Patient taking differently: Take 40 mg by mouth as needed.   Briscoe Deutscher, DO Taking Active Self  gabapentin (NEURONTIN) 300 MG capsule [id number] Yes Take 300-600 mg by mouth See admin instructions. Take 600 mg by mouth three times daily and  300 mg at night [provider] Taking Active Self  ketoconazole (NIZORAL) 2 % shampoo [id number] Yes Apply 1 application topically every other day. [provider] Taking Active Self  Menthol-Methyl Salicylate (THERA-GESIC PLUS) CREA [id number] Yes Apply 1 application topically at bedtime. Apply to lower back [provider] Taking Active Self  Multiple Vitamins-Minerals (MULTIVITAMINS THER. W/MINERALS) Sheral Flow [id number] Yes Take 1 tablet by mouth daily. [provider] Taking  Active Self  neomycin-polymyxin-hydrocortisone (CORTISPORIN) 3.5-10000-1 OTIC suspension [id number] Yes Place 2 drops into both ears daily. [provider] Taking Active Self  nortriptyline (PAMELOR) 10 MG capsule [id number] Yes 2 CAPSULES EVERY NIGHT AT BEDTIME  Patient taking differently: Take 20 mg by mouth at bedtime.   Alda Berthold, DO Taking Active Self  potassium chloride (KLOR-CON) 10 MEQ tablet [id number] Yes Take 4 tablets (40 mEq total) by mouth 2 (two) times daily.  Patient taking differently: Take 20 mEq by mouth daily.   Briscoe Deutscher, DO Taking Active Self  saccharomyces boulardii (FLORASTOR) 250 MG capsule [id number] Yes Take 1 capsule (250 mg total) by mouth 2 (two) times daily.  Patient taking differently: Take 250 mg by mouth daily.   Lavina Hamman, MD Taking Active Self  sildenafil (VIAGRA) 100 MG tablet [id number] Yes Take 100 mg by mouth as needed. [provider] Taking Active   Med List Note Leslye Peer, CPhT 08/21/11 1042): [place]            Patient Active Problem List   Diagnosis Date Noted   ED (erectile dysfunction) 01/13/2021   Stiffness of left hand joint 01/13/2021   Joint stiffness of hand, right 01/13/2021   Cyst, dermoid, scalp and neck 01/13/2021   Hypertension associated with type 2 diabetes mellitus ([place]) 05/23/2020   Chronic kidney disease (CKD), stage III (moderate) (HCC) 12/13/2019   Carpal tunnel syndrome 12/05/2019   Elevated creatine kinase 11/15/2019   Aortic atherosclerosis ([place]) 11/15/2019   Recurrent Clostridium difficile diarrhea 05/28/2017   Peripheral neuropathy 03/17/2017   Diabetes ([place]) 12/11/2016   Allergic rhinitis 12/11/2016   Degenerative arthritis of left knee 01/30/2016   Class 3 severe obesity with serious comorbidity and body mass index (BMI) of 45.0 to 49.9 in adult ([place]) 01/30/2016   History of pulmonary embolus (PE) 12/19/2013   Anxiety state 03/29/2007   Major depressive disorder  03/29/2007   OSA (obstructive sleep apnea) 03/29/2007   Unspecified glaucoma 03/29/2007   BPH (benign prostatic hyperplasia) 03/29/2007   OA (osteoarthritis) of knee 03/29/2007  Essential hypertension 03/29/2007   History of Bell's palsy 03/29/2007    Immunization History  Administered Date(s) Administered   Fluad Quad(high Dose 65+) 02/22/2019, 04/15/2020   H1N1 05/25/2008   Influenza, High Dose Seasonal PF 02/26/2016, 03/17/2017, 02/28/2018   Influenza, Seasonal, Injecte, Preservative Fre 04/02/2009, 04/03/2010, 03/24/2013, 04/06/2014   Influenza,inj,Quad PF,6+ Mos 03/15/2015, 02/25/2021   Influenza-Unspecified 02/16/2008, 03/11/2011, 05/06/2012, 02/14/2015, 03/31/2016, 03/15/2017, 02/22/2019   Moderna Sars-Covid-2 Vaccination 07/31/2019, 08/29/2019, 05/07/2020   Pneumococcal Conjugate-13 09/11/2013, 03/15/2015   Pneumococcal Polysaccharide-23 04/03/2010   Td 04/15/2020   Tdap 11/27/2008   Zoster, Live 02/05/2010    Conditions to be addressed/monitored:  Hypertension, Hyperlipidemia, Diabetes, Coronary Artery Disease, Depression and BPH   There are no care plans that you recently modified to display for this patient.   Compliance/Adherence/Medication fill history: Care Gaps: Eye exam  Urine microalbumin Colonoscopy (due 10/29/17) Foot exam (due 12/11/20)  Star-Rating Drugs: [facility]atin [facility]  Medication Assistance: None required.  Patient affirms current coverage meets needs.  Patient's preferred pharmacy is:  [id number] - , [address] [place] [address] Phone: [phone] Fax: [phone]  Uses pill box? Yes Pt endorses 100% compliance  We discussed: Current pharmacy is preferred with insurance plan and patient is satisfied with pharmacy services Patient decided to: Continue current medication management strategy  Care Plan and Follow Up Patient Decision:  Patient agrees to Care Plan and  Follow-up.  Plan: Telephone follow up appointment with care management team member scheduled for:  6 months  Charlene Brooke, PharmD, [place], CPP Clinical Pharmacist [facility] [phone]   Current Barriers:  Unable to independently monitor therapeutic efficacy  Pharmacist Clinical Goal(s):  Patient will achieve adherence to monitoring guidelines and medication adherence to achieve therapeutic efficacy through collaboration with PharmD and provider.   Interventions: 1:1 collaboration with Hoyt Koch, MD regarding development and update of comprehensive plan of care as evidenced by provider attestation and co-signature Inter-disciplinary care team collaboration (see longitudinal plan of care) Comprehensive medication review performed; medication list updated in electronic medical record  Hypertension / CKD 3a    BP goal is:  <130/80 Patient checks BP at home when feeling symptomatic Patient home BP readings are ranging: 120s-130s/60s   Patient has failed these meds in the past: HCTZ Patient is currently controlled on the following medications:  Atenolol 25 mg - 1/2 tab daily AM - HOLDING Furosemide 20 mg - 2 tab daily (pt takes less than prescribed) Klor-Con 10 mEq - 2 tab daily   We discussed: pt's regimen is not ideal given CKD; pt has been on atenolol for over 10 years via [place] provider; discussed pt may benefit from ACE/ARB for kidney protection given fluctuating kidney function (GFR 38-50 over past year); pt does not want to make changes today but may discuss optimizing BP regimen with [facility] provider   Plan Recommend switching atenolol to lisinopril 5 mg for additional kidney protection benefits - pt to discuss with [facility] provider   Hyperlipidemia / CAD    LDL goal < 70 Hx CAD   04/15/20 labs at [facility] (per patient report) Chol 86 TRIG 122 HDL 33 Non-HDL 52  Patient has failed these meds in past: n/a Patient is currently controlled on  the following medications:  Atorvastatin 80 mg - 1/2 tab daily HS   We discussed:  diet and exercise extensively; Cholesterol goals; benefits of statin for ASCVD risk reduction; cholesterol has improved since  pt has lost >30 lbs   Plan: Continue current medications and control with diet and exercise   Diabetes    A1c goal <7% A1c 5.4% Nov 1st at [facility] (per pt report) Checking BG: Never    Patient has failed these meds in past: Trulicity, metformin, Ozempic Patient is currently controlled on the following medications: None   We discussed: ***   Plan: Continue current medications and control with diet and exercise   Depression   Patient has failed these meds in past: n/a Patient is currently controlled on the following medications:  Bupropion SR 150 mg BID Nortriptyline 10 mg - 2 cap HS   We discussed: Patient is satisfied with current regimen and denies issues; he reports nortriptyline is not for mood  Plan: Continue current medications   Pain    Peripheral neuropathy Degenerative osteoarthritis of knee   Patient has failed these meds in past: tramadol Patient is currently controlled on the following medications:  Gabapentin 300 mg - 2 AM, 2 lunch, 2 PM, 1 HS Nortriptyline 10 mg - 2 cap HS Tylenol 500 mg PRN   We discussed:  Pt reports taking nortriptyline for neuropathy (not mood); Patient is satisfied with current regimen and denies issues   Plan: Continue current medications   Hx PE    Patient has failed these meds in past: n/a Patient is currently controlled on the following medications:  Eliquis 5 mg BID   We discussed: lifelong indication for Eliquis; bleeding risk; pt endorses occasional bruising and nuisance bleeding; denies major bleeding -pt was on Eliquis 2.5 mg for a month after his knee surgery, he is now back on full dose given hx of PE   Plan: Continue current medications   BPH     Patient has failed these meds in past: tamsulosin? Patient is  currently controlled on the following medications:  Finasteride 5 mg HS   We discussed:  Pt reports side effects he attributes to finasteride - erectile dysfunction mainly; he has not discussed this with prescriber before; he thinks he has taken Flomax before; a [facility] provider prescribes his BPH medications; advised he discuss optimizing his BPH treatment to minimize side effects   Plan Continue current medications  Pt to discuss therapy options with [facility] provider  Patient Goals/Self-Care Activities Patient will:  - take medications as prescribed -focus on medication adherence by pill box -check blood pressure daily -Discuss optimizing blood pressure medications for kidney disease with [facility] provider -Discuss optimizing prostate medications with [facility] provider

## 2021-04-15 NOTE — Telephone Encounter (Signed)
  Chronic Care Management   Outreach Note  04/15/2021 Name: MAVEN VARELAS MRN: [id number] DOB: Jul 05, 1945  Referred by: Hoyt Koch, MD  Patient had a phone appointment scheduled with clinical pharmacist today.  An unsuccessful telephone outreach was attempted today. The patient was referred to the pharmacist for assistance with medications, care management and care coordination.   Patient will NOT be penalized in any way for missing a CCM appointment. The no-show fee does not apply.  If possible, a message was left to return call to: [phone] or to [facility]   Charlene Brooke, PharmD, [place], CPP Clinical Pharmacist [facility] [phone]

## 2021-04-15 NOTE — Progress Notes (Signed)
Thomas [name] is a 75 y.o. male seen in the office on 04/15/2021    Chief Complaint   Patient presents with   . Follow Up 6 Months     Atrial flutter          Patient Active Problem List   Diagnosis   . Atrial flutter (CMS HCC)   . Body mass index (BMI) of 33.0 to 33.9 in adult   . Chest tightness   . Essential hypertension   . Hyperlipidemia   . Left bundle branch hemiblock   . Paroxysmal atrial flutter (CMS HCC)   . History of cardioversion   . History of cardiac cath-Overall normal coronary anatomy 12/26/2018   . Cardiac left ventricular ejection fraction 55-60% on TEE 12/26/18       History of Present Illness: This is 75 year old male patient with a past medical history of paroxysmal atrial flutter/fib, hypertension, hyperlipidemia, gout, and chronic shortness of breath-patient is to see pulmonology in this regard. He was seen one time byDr. Janyth Contes for atrial flutter.Holter monitor, echocardiogram, and stress test werecompleted in the past. Patient was seen at our office to establish care. He was complaining of chest pain that radiated into his left armand palpitations.Nuclear stress test and Holter monitor were ordered. Patient underwent nuclear stress test which returned abnormal. Holter monitor was never completed. Cardiac catheterization was completed 12/26/2018 which revealed overall normal coronary anatomy with an estimated ejection fraction of 55%. Dr. Ginger Organ recommended TEE/DC cardioversionashe was in atrial fibrillation/atrial flutter. TEE was completed revealing an EF of 55-60%. No evidence of thrombus was identified. The patient was successfully cardioverted to normal sinus rhythm and initiated on flecainide.  When the patient presented for follow-up following TEE/DC cardioversion he was back in atrial fibrillation.  The patient wished to continue with rate control and anticoagulation.  His flecainide was discontinued.  Since then, he did convert back to normal sinus and his  flecainide was re-initiated.  He has continued to go in and out of atrial fibrillation/flutter since then.    He presents to the office today for routine follow-up.  He is overall feeling well.  He was seen in the emergency department at [facility] on August 2022 with a chief complaint of right-sided scapular pain.  He was concerned that this could have been his heart however he was discharged home diagnosis 1 chronic scapular pain.  Cardiac workup at that time was unremarkable.  He has denied any episodes of chest discomfort.  He has not used any nitro since his last visit.  He does occasionally have chest pain that comes and goes since he was diagnosed with a flutter but it is not any different from previous visits.  He does continue to report shortness of breath but states that this is because he is overweight it is no different from prior visits.  He did recently stop his Anoro and Spiriva secondary to hypertension however his blood pressure does remain elevated.  He does present with a very detailed log of his blood pressure readings.  He on average is taking his blood pressure approximately 3 to 4 times a day.  Review of this log shows that he has several days where his blood pressure is normotensive in the 120/80 ranges it also shows that he has several days where his blood pressure is mildly elevated in the [id number] systolic range.  Patient does note that his heart flutters at times but is unsure of how often this is happening.  He  is also concerned that sometimes his heart rate is in the 50s.  He denies feeling lightheaded or dizzy at that time.  The patient does note concerned that he has bleeding with ejaculation, patient describes this bleeding is only present in ejaculate, states that it is streaky and is not a lot of blood.  He denies any hematuria he denies any blood in his bowel movements.  He does not report any other complaints of bleeding he is tolerating Eliquis as of this  time.      Current Outpatient Medications   Medication Sig   . acetaminophen (TYLENOL) 500 mg Oral Tablet Take 500 mg by mouth Every 4 hours as needed for Pain (pt takes tylenol pm)   . ANORO ELLIPTA 62.5-25 mcg/actuation Inhalation oral diskus inhaler inhale ONE DOSE EVERY DAY AS DIRECTED   . apixaban (ELIQUIS) 5 mg Oral Tablet Take 5 mg by mouth   . dilTIAZem (CARDIZEM CD) 240 mg Oral Capsule, Sust. Release 24 hr TAKE ONE CAPSULE BY MOUTH EVERY DAY   . flecainide (TAMBOCOR) 100 mg Oral Tablet TAKE ONE TABLET BY MOUTH TWICE DAILY   . furosemide (LASIX) 40 mg Oral Tablet TAKE ONE TABLET BY MOUTH EVERY DAY   . hydrALAZINE (APRESOLINE) 10 mg Oral Tablet TAKE ONE TABLET BY MOUTH THREE TIMES DAILY   . lisinopriL (PRINIVIL) 40 mg Oral Tablet Take 1 Tablet (40 mg total) by mouth Once a day   . nitroGLYCERIN (NITROSTAT) 0.4 mg Sublingual Tablet, Sublingual Place 1 Tablet (0.4 mg total) under the tongue Every 5 minutes as needed for Chest pain   . potassium chloride (KLOR-CON) 10 mEq Oral Tablet Sustained Release TAKE ONE TABLET BY MOUTH EVERY DAY   . simvastatin (ZOCOR) 20 mg Oral Tablet Take 1 Tablet (20 mg total) by mouth Every night       No Known Allergies  Family Medical History:     Problem Relation (Age of Onset)    Cancer Mother    Hypertension (High Blood Pressure) Mother, Father          Social History     Socioeconomic History   . Marital status: Widowed   Tobacco Use   . Smoking status: Former     Types: Cigars   . Smokeless tobacco: Current     Types: Chew, Snuff   Vaping Use   . Vaping Use: Never used   Substance and Sexual Activity   . Alcohol use: Yes     Comment: occasional   . Drug use: Never   . Sexual activity: Not Currently         Physical Exam:  BP (!) 148/80   Pulse 67   Ht 1.803 m (5\' 11" )   Wt 122 kg (269 lb)   SpO2 94%   BMI 37.52 kg/m       Body mass index is 37.52 kg/m.  Wt Readings from Last 5 Encounters:   04/15/21 122 kg (269 lb)   04/01/21 123 kg (270 lb 3.2 oz)   02/05/21 119 kg  (262 lb)   01/24/21 123 kg (272 lb)   10/18/20 123 kg (272 lb)       General:  Elderly well-appearing male sitting on exam table, Awake alert oriented, no apparent distress, wife at bedside  Neck: Strong Carotid pulse, no JVD, No carotid bruit,   Heart:  Regular rate and rhythm without murmurs,rubs, or gallops  Lungs:  Clear to auscultation without rales wheezes or rhonchi  Abdomen:  Soft,  nontender nondistended, normoactive bowel sounds  Extremities:  No peripheral edema, bilateral lower extremities warm to touch, palpable bilateral posterior tibialis pulses  Skin:  Warm, dry    PERTINENT DIAGNOSTICS: EKG performed in the office today shows Sinus bradycardia at 53 BMP there is a first-degree AV block with PAC noted left axis deviation, unchanged from prior    Pertinent lab results reviewed from 02/05/2021 show sodium 141, potassium 3.9, chloride 104, BUN 17, creatinine 0.96    Lipid panel 01/24/2021  CHOLESTEROL   75 - 200 mg/dL 126  164  153 R  151 R  198 R    TRIGLYCERIDES   28 - 153 mg/dL 77  120  64 R  58 R  82 R    LDL CALC   0 - 99 mg/dL 62  87  86 R  87 R  82 R    VLDL CALC   0 - 50 mg/dL 15  24       CHOL/HDL RATIO  2.6              IMPRESSION:  1. Atrial flutter, unspecified type (CMS [place])    2. Essential hypertension    3. Mixed hyperlipidemia    4. Left bundle branch hemiblock          PLAN:  Overall this patient does seem stable from cardiac perspective.  His blood pressure is elevated today at 148/80.  He did present to the clinic today with an extensive blood pressure log.  It shows that multiple times throughout the week he does have normotensive blood pressures in the 120 range.  He also has readings chronically elevated in the 140-150 range.  He recently did stop Anoro and Spiriva approximately 2 weeks ago.  He believes these medications may still be raising his blood pressure although I am not convinced of this.  He is compliant with his medication regimen.  He is taking 10 mg of hydralazine  t.i.d..  I did consider increasing this but the patient is somewhat hesitant at this time.  The plan will be for him to continue to keep a log of his blood pressure medication over the next 2 weeks and then call in to the nursing line to discuss his blood pressure readings.  If they continue to remain elevated in the 140-150 range I would recommend increasing hydralazine to 20 mg t.i.d..  The patient at the last visit was going in and out of a flutter and does report that he has had continued fluttering in the chest although he is uncertain as to how often this is happening.  At this time I would place an event monitor as discussed at the prior visit.  In regards to the patient's bleeding with ejaculation I do not feel this is coming from the Eliquis.  He does not have any episodes of hematuria or hematochezia that he is aware of.  I have referred him to PCP and/or urology for evaluation in this regard.  I have asked him to return back to our clinic in 6 months for routine office follow-up.  He can call our office sooner for any acute cardiac concerns.    Chart sent to Dr. Ginger Organ for review.  He was readily available at time of office visit for discussion regarding this patient  Orders Placed This Encounter   . EKG - In Clinic, Same Day (93000)   . 7 DAY EXTENDED HOLTER MONITOR     Total time spent on encounter  was 25 minutes. In addition to face to face time with the patient performing history and exam, this includes time spent before and after the visit reviewing records, personally reviewing prior imaging, reviewing prior cardiac studies as available, reviewing prior outpatient notes as available, reviewing the patient's medication record, coordinating with office staff and time spent performing final documentation in the medical record.

## 2021-04-18 LAB — ECG W INTERP (AMB USE ONLY)(MUSE,IN CLINIC)
Atrial Rate: 53 {beats}/min
Calculated P Axis: 51 degrees
Calculated R Axis: -68 degrees
Calculated T Axis: 57 degrees
PR Interval: 244 ms
QRS Duration: 132 ms
QT Interval: 464 ms
QTC Calculation: 435 ms
Ventricular rate: 53 {beats}/min

## 2021-04-28 ENCOUNTER — Encounter (HOSPITAL_COMMUNITY): Payer: Self-pay | Admitting: Student in an Organized Health Care Education/Training Program

## 2021-04-29 ENCOUNTER — Other Ambulatory Visit: Payer: Self-pay

## 2021-04-29 ENCOUNTER — Ambulatory Visit
Payer: No Typology Code available for payment source | Attending: Student in an Organized Health Care Education/Training Program | Admitting: Student in an Organized Health Care Education/Training Program

## 2021-04-29 ENCOUNTER — Encounter (HOSPITAL_COMMUNITY): Payer: Self-pay | Admitting: Student in an Organized Health Care Education/Training Program

## 2021-04-29 VITALS — BP 142/78 | HR 49 | Temp 96.3°F | Resp 18 | Ht 71.0 in | Wt 267.2 lb

## 2021-04-29 DIAGNOSIS — I4891 Unspecified atrial fibrillation: Secondary | ICD-10-CM | POA: Insufficient documentation

## 2021-04-29 DIAGNOSIS — I10 Essential (primary) hypertension: Secondary | ICD-10-CM | POA: Insufficient documentation

## 2021-04-29 DIAGNOSIS — Z87891 Personal history of nicotine dependence: Secondary | ICD-10-CM | POA: Insufficient documentation

## 2021-04-29 DIAGNOSIS — J986 Disorders of diaphragm: Secondary | ICD-10-CM | POA: Insufficient documentation

## 2021-04-29 DIAGNOSIS — I447 Left bundle-branch block, unspecified: Secondary | ICD-10-CM | POA: Insufficient documentation

## 2021-04-29 DIAGNOSIS — Z9889 Other specified postprocedural states: Secondary | ICD-10-CM | POA: Insufficient documentation

## 2021-04-29 DIAGNOSIS — J449 Chronic obstructive pulmonary disease, unspecified: Secondary | ICD-10-CM | POA: Insufficient documentation

## 2021-04-29 DIAGNOSIS — R0683 Snoring: Secondary | ICD-10-CM | POA: Insufficient documentation

## 2021-04-29 DIAGNOSIS — I4892 Unspecified atrial flutter: Secondary | ICD-10-CM | POA: Insufficient documentation

## 2021-04-29 DIAGNOSIS — E669 Obesity, unspecified: Secondary | ICD-10-CM | POA: Insufficient documentation

## 2021-04-29 MED ORDER — ALBUTEROL SULFATE CONCENTRATE 2.5 MG/0.5 ML SOLUTION FOR NEBULIZATION
2.5000 mg | INHALATION_SOLUTION | RESPIRATORY_TRACT | 4 refills | Status: AC | PRN
Start: 2021-04-29 — End: 2021-07-28

## 2021-04-29 NOTE — Progress Notes (Signed)
PULMONARY OFFICE VISIT    Thomas [name], 75 y.o. male  Date of service: 04/29/2021  Date of Birth:  May 20, 1946    IMPRESSION:  Chronic obstructive pulmonary disease  Elevated left hemidiaphragm  Suspected sleep apnea  Former smoker  Secondhand Smoke exposure  Obesity  Atrial fibrillation/flutter with history of cardioversion  Hypertension  Left bundle branch block    RECOMMENDATIONS:  Patient wishes not to resume inhaler therapy since stopping his inhalers has improved his blood pressure, he has lost 3lb since stopping the anoro and does not note any difference in his breathing with discontinuing inhaler therapy. Given air trapping on PFT i encouraged patient to consider inhaler therapy but he wishes to revisit this discussion at his next visit. Use Albuterol PRN. Call back if breathing worsenins and wishes to resume alternative controller therapy.  Sniff testing to evaluate for diaphragm dysfunction  Advised to follow up with cardiology and PCP for BP control  Encouraged patient to continue weight loss, given beneficial effect on his or all health and is breathing. Offered weight loss clinic referral but patient wishes to continue to lose weight on his own.  Patient pending sleep apnea testing. Advised to avoid driving or operative heavy machinery when sleepy.  Return to clinic in 3 months    Orders Placed This Encounter   . FLUORO SNIFF TEST (FLUORO OF DIAPHRAGM)   . albuterol (PROVENTIL) 2.5 mg/0.5 mL Inhalation Solution for Nebulization      Problem List Items Addressed This Visit    None  Visit Diagnoses     Diaphragm dysfunction    -  Primary    Relevant Orders    FLUORO SNIFF TEST (FLUORO OF DIAPHRAGM)        CHIEF COMPLAINT:  Shortness of breath    HISTORY OF PRESENT ILLNESS:    Thomas [name] is a 75 y.o. male with PMH of COPD COLD stage B, former smoker, questionable elevated left diaphragm, obesity, hypertension, atrial fibrillation/flutter with history of cardioversion, left bundle branch block.       From my prior note 01/2021:  Patient is here to establish care.  He was diagnosed with COPD 2 years ago and was started on Anoro.  He states that he has been doing pretty well since he has been on air Anoro he.  He noted significant improvement in his shortness of breath.  He does not need albuterol at home.  He has not had any ED visits or hospitalizations.  He has not had any prednisone or antibiotics to use in the last 2 years.  He is able to walk on flat ground without any limitation in his activity, however he is only able to go up 3 flights of stairs before he gets short of breath.  Patient is High risk for OSA given the history of hypertension, elevated BMI, symptoms of snoring, occasional morning headaches, and Epworth Sleepiness Scale of 10.   Per review of prior records from Care everywhere patient saw Dr. Carolanne Grumbling from pulmonary in [facility] on 04/05/2019 at which time he was planned to be evaluated with stiff testing, sleep apnea testing and pulmonary function testing.  The testing was not completed at that time.    Today:     COPD assessment tet score: 21  Symptoms:  Shortness of breath on exertion  SABA use: none  Triggers:  Exertion.  No known allergic triggers  Intubations:  None  Exacerbations in the last year:  None  Prednisone  use in the last year:  None  Antibiotic use in the last year:  None  Disease severity:  COPD gold stage A  Tobacco:  Patient has been smoking cigars and pipes from age 38 to age 79.   Occupation: Engineer, water   Exposures: (asbestos, coal, silica, mold): none  Travel: none  Pets/birds: none  Family history of lung disorder: none   Family history of lung cancer: none  Brother has OSA    Past Medical History:   Diagnosis Date   . Arthritis    . Dysuria    . Enteritis due to Rotavirus    . Gout    . Headache    . Hemorrhoid    . Hypercholesterolemia    . Hypertension      Past Surgical History:   Procedure Laterality Date   . HX HEMORRHOIDECTOMY         Cannot display prior to admission medications because the patient has not been admitted in this contact.        No current facility-administered medications for this visit.    No Known Allergies  Social History     Tobacco Use   . Smoking status: Former     Types: Cigars   . Smokeless tobacco: Current     Types: Chew, Snuff   Substance Use Topics   . Alcohol use: Yes     Comment: occasional     Family History:  relevant history summarized above in the HPI.   No other family history of pulmonary significance.     ROS:   Other than ROS in the HPI, all other systems were negative.    EXAM:  Temperature: 35.7 C (96.3 F)  Heart Rate: 49  BP (Non-Invasive): (!) 142/78  Respiratory Rate: 18  SpO2: 96 %  General: Seated, in no acute distress  HEENT: Atraumatic normocephalic.   Neck: Trachea in the midline. Supple.   Cardiovascular: S1-S2 regular. No murmur.  Lungs: Good air entry bilaterally. No rales rhonchi or wheezing.  GI: Soft, nontender. No rebound or guarding. Active bowel sounds.  Musculoskeletal: No joint swelling or erythema or tenderness  Extremities: No edema clubbing or cyanosis.  Skin: Dry, warm to touch. Normal turgor.   Neurological: Awake, alert , oriented. Nonfocal.  Psychiatric: Normal mood and affect.      Labs:    No results found for this or any previous visit (from the past 24 hour(s)).   Imaging Studies:      CXR:  None on file    CT CHEST:  CTA CHEST 01/26/2018, report on care everywhere, no images available for my personal interpretation and review  COMPARISON: CTA chest 12/21/2017.   FINDINGS:   No evidence of acute pulmonary embolism. No thoracic aortic aneurysm or   dissection.   The heart is normal in size without pericardial effusion. No pathologically   enlarged lymph nodes in the chest.   Elevation of the left hemidiaphragm with likely left basilar atelectasis. No   pneumothorax, pleural effusion, or consolidation. Central airways are patent.   Nonobstructing left nephrolithiasis.  Upper abdomen otherwise demonstrates no   acute pathology.   Sternum is intact. Thoracic vertebral body heights are preserved. No acute   displaced rib fracture identified. No suspicious bony lesions.   IMPRESSION:   1. No evidence of acute pulmonary embolism.   2. Elevation of the left hemidiaphragm with likely left basilar atelectasis. No pneumothorax, pleural effusion, or consolidation.   3. Nonobstructing left nephrolithiasis.  PULMONARY FUNCTION TESTING  03/13/2021  RESULTS:  FEV1/FVC ratio is normal at 71%.  FEV1 is normal at 84% of predicted.  FVC is normal at 88% predicted.  Following albuterol, there is no significant improvement in spirometry.    Total lung capacity is normal at 116% of predicted.  Residual volume is increased at 155% of predicted.  Slow vital capacity is normal at 94% of predicted.  Diffusion capacity is reduced at 67% of predicted.  Flow volume loop is obstructed in appearance.  IMPRESSION:  Normal spirometry.    No significant improvement in spirometry following albuterol   Air trapping as indicated by increased residual volume 155% predicted.  Decreased diffusion capacity at 67% predicted, with no significant correction when considering alveolar volume   Though spirometry component is normal, the patient's lung volumes indicate air trapping, diffusion capacity is decreased and flow volume lopo is obstructed appearance.  This pattern suggests an underlying obstructive pulmonary limitation.  Further clinical and radiographic correlation is advised.    Spirometry 01/28/2018      Data:   Spirometry: The FVC is 3.6 L or 81% predicted which is normal. The FEV1 is 2.36 L or 71% predicted which is mildly reduced. The FEV1 to FVC ratio is 0.66 which is low. MVV is 82 L/min which is what is expected for this FEV1.   Interpretation: There is mild airflow obstruction. There is no previous spirometry to compare this to.     Total time spent on encounter was 50 minutes. In addition to  face to face time with the patient performing history and exam, this includes time spent before and after the visit reviewing records, personally reviewing prior imaging, reviewing prior cardiac studies as available, reviewing prior outpatient notes as available, reviewing the patient's medication record, coordinating with office staff and time spent performing final documentation in the medical record.    Stefan Church, MD

## 2021-04-30 ENCOUNTER — Telehealth (HOSPITAL_COMMUNITY): Payer: Self-pay | Admitting: PULMONARY DISEASE

## 2021-04-30 NOTE — Telephone Encounter (Signed)
Returned call to [facility] pharmacy regarding nebulizer e-ecript. Informed that concentrated albuterol is not available. Order will be changed to premixed nebulizer solution.    Mickeal Needy, PA-C

## 2021-05-02 ENCOUNTER — Ambulatory Visit (HOSPITAL_COMMUNITY): Payer: Self-pay | Admitting: Radiology

## 2021-05-06 ENCOUNTER — Ambulatory Visit (HOSPITAL_COMMUNITY): Payer: Self-pay | Admitting: Radiology

## 2021-05-12 ENCOUNTER — Other Ambulatory Visit: Payer: Self-pay

## 2021-05-12 ENCOUNTER — Ambulatory Visit: Payer: No Typology Code available for payment source | Attending: Physician Assistant | Admitting: Physician Assistant

## 2021-05-12 ENCOUNTER — Encounter (INDEPENDENT_AMBULATORY_CARE_PROVIDER_SITE_OTHER): Payer: Self-pay | Admitting: Physician Assistant

## 2021-05-12 VITALS — Resp 16 | Ht 71.0 in | Wt 267.0 lb

## 2021-05-12 DIAGNOSIS — R3912 Poor urinary stream: Secondary | ICD-10-CM

## 2021-05-12 DIAGNOSIS — Z125 Encounter for screening for malignant neoplasm of prostate: Secondary | ICD-10-CM | POA: Insufficient documentation

## 2021-05-12 DIAGNOSIS — R3911 Hesitancy of micturition: Secondary | ICD-10-CM

## 2021-05-12 DIAGNOSIS — R338 Other retention of urine: Secondary | ICD-10-CM

## 2021-05-12 DIAGNOSIS — R361 Hematospermia: Secondary | ICD-10-CM | POA: Insufficient documentation

## 2021-05-12 DIAGNOSIS — N401 Enlarged prostate with lower urinary tract symptoms: Secondary | ICD-10-CM | POA: Insufficient documentation

## 2021-05-12 MED ORDER — TAMSULOSIN 0.4 MG CAPSULE
0.4000 mg | ORAL_CAPSULE | Freq: Every evening | ORAL | 2 refills | Status: DC
Start: 2021-05-12 — End: 2021-07-18

## 2021-05-12 NOTE — Progress Notes (Signed)
[facility]  [facility]  INITIAL PATIENT VISIT          NAME :Thomas [name]  AGE: 75 y.o.  DATE: 05/12/2021  SERVICE: [facility]      CC: BPH     History of present illness:  75 year old male presents for new patient evaluation of weakened stream and hesitancy with hematospermia. He previously was seen by our office 4-5 years ago. He was seen for a similar reason. He reports that he has been having a slow stream with hesitancy for the past few years. He also reports trouble maintaining a stream. He denies dysuria and gross hematuria. He denies urgency and leaking.     He also notes that he has infrequent blood in his ejaculate. He last saw blood one month ago. He reports seeing a small amount of red blood without clots. He denies pain with ejaculation.     He denies family history of GU cancer. He is an infrequent smoker quit around age 16.       Past Medical History:   Diagnosis Date   . Arthritis    . Dysuria    . Enteritis due to Rotavirus    . Gout    . Headache    . Hemorrhoid    . Hypercholesterolemia    . Hypertension            Past Surgical History:   Procedure Laterality Date   . HX HEMORRHOIDECTOMY             No Known Allergies    Family Medical History:     Problem Relation (Age of Onset)    Cancer Mother    Hypertension (High Blood Pressure) Mother, Father            acetaminophen (TYLENOL) 500 mg Oral Tablet, Take 500 mg by mouth Every 4 hours as needed for Pain (pt takes tylenol pm)  albuterol (PROVENTIL) 2.5 mg/0.5 mL Inhalation Solution for Nebulization, Take 0.5 mL (2.5 mg total) by nebulization Every 4 hours as needed for up to 90 days  albuterol sulfate (PROVENTIL) 2.5 mg /3 mL (0.083 %) Inhalation nebulizer solution,   apixaban (ELIQUIS) 5 mg Oral Tablet, Take 5 mg by mouth  dilTIAZem (CARDIZEM CD) 240 mg Oral Capsule, Sust. Release 24 hr, TAKE ONE CAPSULE BY MOUTH EVERY DAY  flecainide (TAMBOCOR) 100 mg Oral Tablet, TAKE ONE TABLET BY MOUTH TWICE  DAILY  furosemide (LASIX) 40 mg Oral Tablet, TAKE ONE TABLET BY MOUTH EVERY DAY  hydrALAZINE (APRESOLINE) 10 mg Oral Tablet, TAKE ONE TABLET BY MOUTH THREE TIMES DAILY  lisinopriL (PRINIVIL) 40 mg Oral Tablet, Take 1 Tablet (40 mg total) by mouth Once a day  nitroGLYCERIN (NITROSTAT) 0.4 mg Sublingual Tablet, Sublingual, Place 1 Tablet (0.4 mg total) under the tongue Every 5 minutes as needed for Chest pain  potassium chloride (KLOR-CON) 10 mEq Oral Tablet Sustained Release, TAKE ONE TABLET BY MOUTH EVERY DAY  simvastatin (ZOCOR) 20 mg Oral Tablet, Take 1 Tablet (20 mg total) by mouth Every night    No facility-administered medications prior to visit.          REVIEW OF SYSTEMS  All 10 systems were reviewed.  Pertinent positive as listed above in HPI. Otherwise ,all other systems are negative.      PHYSICAL EXAMINATION:   General: No apparent distress, well appearing  Skin: warm and dry.    Eyes:  Eyes are clear.   Pulm:  Respiratory effort is unlabored.     Psych: Pt is alert, appropriate mood, and in no acute distress.      MS:  Pt  Ambulates without assistance  CV: No extremity swelling   Neuro:  Neurological exam is consistent with patients age.     Rectal: No gross masses or blood. The prostate is approximately 40 grams.  Symmetric, with normal contour.  No nodules or induration      Office Testing/Procedures:  Nursing Notes:   Dionne Bucy, [place]  05/12/21 1124  Signed     05/12/21 1100   Urine test  (Siemens Multistix 10 SG)   Time collected 1124   Color (Ref Range: Yellow) Yellow   Clarity (Ref Range: Clear) Clear   Glucose (Ref Range: Negative mg/dL) Negative   Bilirubin (Ref Range: Negative mg/dL) Negative   Ketones (Ref Range: Negative mg/dL) Negative   Urine Specific Gravity (Ref Range: 1.005 - 1.030) 1.000   Blood (urine) (Ref Range: Negative mg/dL) Negative   pH (Ref Range: 5.0 - 8.0) 5.0   Protein (Ref Range: Negative mg/dL) Negative   Urobilinogen (Ref Range: Negative mg/dL) Normal    Nitrite (Ref  Range: Negative) Negative   Leukocytes (Ref Range: Negative WBC's/uL) Negative   PVR Volume 35ml          I personally reviewed the following:    Labs - BMP  Imaging - none  Records - pt previous office records          ASSESSMENT:    Encounter Diagnoses   Name Primary?   . Enlarged prostate with lower urinary tract symptoms (LUTS) Yes   . Encounter for prostate cancer screening    . Hematospermia        PLAN: 75 year old male with history of BPH w/  LUTS and hematospermia     PVR 9cc, urinalysis negative   Urine cytology now for hematospermia   PSA now   Will trial Flomax, se's reviewed, consider cystoscopy prn   Prescription management: flomax     Orders Placed This Encounter   . PSA, DIAGNOSTIC   . POCT URINE DIPSTICK   . POCT PVR   . tamsulosin (FLOMAX) 0.4 mg Oral Capsule   . CYTOPATHOLOGY, URINARY     Return in about 6 weeks (around 06/23/2021) for In Person Visit, PVR.      Reviewed signs and symptoms to prompt sooner appointment or ER evaluation       Alfredo Martinez, PA-C  [facility]

## 2021-05-12 NOTE — Nursing Note (Signed)
05/12/21 1100   Urine test  (Siemens Multistix 10 SG)   Time collected 1124   Color (Ref Range: Yellow) Yellow   Clarity (Ref Range: Clear) Clear   Glucose (Ref Range: Negative mg/dL) Negative   Bilirubin (Ref Range: Negative mg/dL) Negative   Ketones (Ref Range: Negative mg/dL) Negative   Urine Specific Gravity (Ref Range: 1.005 - 1.030) 1.000   Blood (urine) (Ref Range: Negative mg/dL) Negative   pH (Ref Range: 5.0 - 8.0) 5.0   Protein (Ref Range: Negative mg/dL) Negative   Urobilinogen (Ref Range: Negative mg/dL) Normal    Nitrite (Ref Range: Negative) Negative   Leukocytes (Ref Range: Negative WBC's/uL) Negative   PVR Volume 34ml

## 2021-05-14 LAB — PSA, DIAGNOSTIC: PSA, TOTAL: 2.22 ng/mL (ref <=4.00)

## 2021-05-15 ENCOUNTER — Ambulatory Visit (HOSPITAL_COMMUNITY): Payer: Self-pay

## 2021-05-15 ENCOUNTER — Telehealth (INDEPENDENT_AMBULATORY_CARE_PROVIDER_SITE_OTHER): Payer: Self-pay | Admitting: Physician Assistant

## 2021-05-15 NOTE — Telephone Encounter (Signed)
Pt called after office hours on Nov 30, stating he took 1 Flomax and woke up at 5am with symptoms of stuffing nose, headache, hard time to breathe at times.  Spoke to [place], advised pt to stop medication and go to er if symptoms get worse.

## 2021-05-27 ENCOUNTER — Telehealth: Payer: Self-pay

## 2021-05-27 DIAGNOSIS — M25562 Pain in left knee: Secondary | ICD-10-CM | POA: Diagnosis not present

## 2021-05-27 NOTE — Progress Notes (Signed)
° ° °  Chronic Care Management Pharmacy Assistant   Name: Tanner [name]  MRN: [id number] DOB: 05-29-1946   Reason for Encounter: Disease State-General Call    Recent office visits:  None ID  Recent consult visits:  None ID  Hospital visits:  None in previous 6 months  Medications: Outpatient Encounter Medications as of 05/27/2021  Medication Sig   acetaminophen (TYLENOL) 500 MG tablet Take 1,000 mg by mouth every 6 (six) hours as needed for moderate pain.   acidophilus (RISAQUAD) CAPS capsule Take 1 capsule by mouth daily.   apixaban (ELIQUIS) 5 MG TABS tablet Take 5 mg by mouth 2 (two) times daily.   atenolol (TENORMIN) 25 MG tablet Take 12.5 mg by mouth daily.   atorvastatin (LIPITOR) 80 MG tablet Take 40 mg by mouth daily.   buPROPion (WELLBUTRIN SR) 150 MG 12 hr tablet Take 150 mg by mouth 2 (two) times daily.   finasteride (PROSCAR) 5 MG tablet Take 5 mg by mouth at bedtime.   furosemide (LASIX) 20 MG tablet Take 2 tablets (40 mg total) by mouth 2 (two) times daily. (Patient taking differently: Take 40 mg by mouth as needed.)   gabapentin (NEURONTIN) 300 MG capsule Take 300-600 mg by mouth See admin instructions. Take 600 mg by mouth three times daily and  300 mg at night   ketoconazole (NIZORAL) 2 % shampoo Apply 1 application topically every other day.   Menthol-Methyl Salicylate (THERA-GESIC PLUS) CREA Apply 1 application topically at bedtime. Apply to lower back   Multiple Vitamins-Minerals (MULTIVITAMINS THER. W/MINERALS) TABS Take 1 tablet by mouth daily.   neomycin-polymyxin-hydrocortisone (CORTISPORIN) 3.5-10000-1 OTIC suspension Place 2 drops into both ears daily.   nortriptyline (PAMELOR) 10 MG capsule 2 CAPSULES EVERY NIGHT AT BEDTIME (Patient taking differently: Take 20 mg by mouth at bedtime.)   potassium chloride (KLOR-CON) 10 MEQ tablet Take 4 tablets (40 mEq total) by mouth 2 (two) times daily. (Patient taking differently: Take 20 mEq by mouth daily.)    saccharomyces boulardii (FLORASTOR) 250 MG capsule Take 1 capsule (250 mg total) by mouth 2 (two) times daily. (Patient taking differently: Take 250 mg by mouth daily.)   sildenafil (VIAGRA) 100 MG tablet Take 100 mg by mouth as needed.   No facility-administered encounter medications on file as of 05/27/2021.   Have you had any problems recently with your health?Patient states that he does not have any new health issues at this time  Have you had any problems with your pharmacy?Patient states that he is not having any problems with getting medications or the cost of medications from the pharmacy  What issues or side effects are you having with your medications?Patient states not side effects from medications  What would you like me to pass along to [facility] for them to help you with? Patient states that he is doing well and has no complaints  What can we do to take care of you better? Patient states that he does not need anything at this time  Care Gaps: Colonoscopy-10/30/14 Diabetic Foot Exam-12/12/19 Ophthalmology-NA Dexa Scan - NA Annual Well Visit - NA Micro albumin-NA Hemoglobin A1c- 08/20/20  Star Rating Drugs: None ID  Ethelene Hal Clinical Pharmacist Assistant [phone]

## 2021-06-11 ENCOUNTER — Ambulatory Visit
Admission: RE | Admit: 2021-06-11 | Discharge: 2021-06-11 | Disposition: A | Discharge status: Home / Self Care | Payer: No Typology Code available for payment source | Source: Ambulatory Visit | Attending: PHYSICIAN ASSISTANT | Admitting: PHYSICIAN ASSISTANT

## 2021-06-11 ENCOUNTER — Other Ambulatory Visit: Payer: Self-pay

## 2021-06-11 DIAGNOSIS — I4892 Unspecified atrial flutter: Secondary | ICD-10-CM | POA: Insufficient documentation

## 2021-06-12 DIAGNOSIS — M25562 Pain in left knee: Secondary | ICD-10-CM | POA: Diagnosis not present

## 2021-06-23 ENCOUNTER — Encounter (INDEPENDENT_AMBULATORY_CARE_PROVIDER_SITE_OTHER): Payer: Self-pay | Admitting: Physician Assistant

## 2021-06-23 ENCOUNTER — Other Ambulatory Visit: Payer: Self-pay

## 2021-06-23 ENCOUNTER — Ambulatory Visit: Payer: No Typology Code available for payment source | Attending: Physician Assistant | Admitting: Physician Assistant

## 2021-06-23 VITALS — Resp 16 | Ht 71.0 in | Wt 265.0 lb

## 2021-06-23 DIAGNOSIS — N401 Enlarged prostate with lower urinary tract symptoms: Secondary | ICD-10-CM | POA: Insufficient documentation

## 2021-06-23 DIAGNOSIS — R361 Hematospermia: Secondary | ICD-10-CM

## 2021-06-23 LAB — 7 DAY EXTENDED HOLTER MONITOR
Isolated SVE count: 6100 episodes
SVE Couplets Counts: 134 episodes

## 2021-06-23 NOTE — Nursing Note (Signed)
06/23/21 1300   Urine test  (Siemens Multistix 10 SG)   PVR Volume 0ml

## 2021-06-23 NOTE — Progress Notes (Signed)
[place]    PATIENT NAME :Thomas [name]   DATE OF SERVICE: 06/23/2021  SERVICE LOCATION: [facility]      Thomas [name] is a 76 y.o. male who presents for followup of weakened stream and hesitancy with hematospermia.    BPH  He reports that he has been having a slow stream with hesitancy for the past few years. He also reported trouble maintaining a stream. Patient was started on Flomax at last office visit. He states he stopped after side effects which he reported cold like symptoms. He recently restarted without trouble. He states that his symptoms are much improved.He denies weakened stream and hesitancy.     Hematospermia  Patient previously was having infrequent blood in his ejaculate. He denies recurrence. His PSA as noted below. He did not complete cytology.     He denies family history of GU cancer. He is an infrequent smoker quit around age 15.     Past Medical History  Past Medical History:   Diagnosis Date    Arthritis     Dysuria     Enteritis due to Rotavirus     Gout     Headache     Hemorrhoid     Hypercholesterolemia     Hypertension            Past Surgical History  Past Surgical History:   Procedure Laterality Date    HX HEMORRHOIDECTOMY             Allergies  No Known Allergies    Medications  acetaminophen (TYLENOL) 500 mg Oral Tablet, Take 1 Tablet (500 mg total) by mouth Every 4 hours as needed for Pain (pt takes tylenol pm)  albuterol (PROVENTIL) 2.5 mg/0.5 mL Inhalation Solution for Nebulization, Take 0.5 mL (2.5 mg total) by nebulization Every 4 hours as needed for up to 90 days  albuterol sulfate (PROVENTIL) 2.5 mg /3 mL (0.083 %) Inhalation nebulizer solution,   apixaban (ELIQUIS) 5 mg Oral Tablet, Take 1 Tablet (5 mg total) by mouth  dilTIAZem (CARDIZEM CD) 240 mg Oral Capsule, Sust. Release 24 hr, TAKE ONE CAPSULE BY MOUTH EVERY DAY  flecainide (TAMBOCOR) 100 mg Oral Tablet, TAKE ONE TABLET BY MOUTH TWICE DAILY  furosemide  (LASIX) 40 mg Oral Tablet, TAKE ONE TABLET BY MOUTH EVERY DAY  hydrALAZINE (APRESOLINE) 10 mg Oral Tablet, TAKE ONE TABLET BY MOUTH THREE TIMES DAILY  lisinopriL (PRINIVIL) 40 mg Oral Tablet, Take 1 Tablet (40 mg total) by mouth Once a day  nitroGLYCERIN (NITROSTAT) 0.4 mg Sublingual Tablet, Sublingual, Place 1 Tablet (0.4 mg total) under the tongue Every 5 minutes as needed for Chest pain  potassium chloride (KLOR-CON) 10 mEq Oral Tablet Sustained Release, TAKE ONE TABLET BY MOUTH EVERY DAY  simvastatin (ZOCOR) 20 mg Oral Tablet, Take 1 Tablet (20 mg total) by mouth Every night  tamsulosin (FLOMAX) 0.4 mg Oral Capsule, Take 1 Capsule (0.4 mg total) by mouth Every evening after dinner    No facility-administered medications prior to visit.      OBJECTIVE:  Resp 16    Ht 1.803 m (5\' 11" )    Wt 120 kg (265 lb)    BMI 36.96 kg/m       General: No apparent distress, well appearing  HEENT:  NCAT, EOMI, OP clear  Neck:  supple, trachea midline  RESP:  Nonlabored breathing, no use of accessory muscles   CV: No extremity swelling  Neuro:  Neurological exam is consistent with patients age.     DRE: Completed at last office visit  Psych: alert, appropriate mood, and in no acute distress.      MSK: Ambulates without assistance      Office Testing/Procedures:  Nursing Notes:   Dimas [place], [place]  06/23/21 1313  Signed     06/23/21 1300   Urine test  (Siemens Multistix 10 SG)   PVR Volume 37ml              PSA:  05/2016 - 1.30  06/2017 - 2.60  04/2021 - 2.22    I personally reviewed the following:    Labs - PSA  Imaging - none  Records - pt previous office records      ASSESSMENT:   Encounter Diagnoses   Name Primary?    Enlarged prostate with lower urinary tract symptoms (LUTS) Yes    Hematospermia          PLAN: 76 year old male with history of BPH w/ LUTS and hematospermia    PVR 0cc  Urine cytology not completed, recommended that patient complete now for hx of hematospermia  PSA stable, repeat 04/2022  Continue Flomax,  recommended daily use for better results, pt agreeable to be compliant, refill sent  Medication management: flomax  All questions and concerns were addressed during this visit.       Orders Placed This Encounter    POCT PVR     Return in about 10 months (around 04/23/2022) for In Person Visit, PVR.      Chong Sicilian, PA-C  [facility]        I reviewed the clinical findings and results as above, and agree with the recommendations as outlined by the Advanced Practice Provider.  Nolon Nations, MD

## 2021-06-24 DIAGNOSIS — I4892 Unspecified atrial flutter: Secondary | ICD-10-CM

## 2021-06-24 LAB — 7 DAY EXTENDED HOLTER MONITOR
Heart rate (average): 53 {beats}/min
Isolated VE Counts: 342 episodes
SVE Triplets Counts: 9 episodes
VE Couplets Counts: 209 episodes

## 2021-06-26 DIAGNOSIS — T8484XA Pain due to internal orthopedic prosthetic devices, implants and grafts, initial encounter: Secondary | ICD-10-CM | POA: Diagnosis not present

## 2021-06-26 DIAGNOSIS — S82002A Unspecified fracture of left patella, initial encounter for closed fracture: Secondary | ICD-10-CM | POA: Diagnosis not present

## 2021-07-04 ENCOUNTER — Ambulatory Visit: Payer: Medicare HMO | Admitting: Hematology & Oncology

## 2021-07-04 ENCOUNTER — Other Ambulatory Visit: Payer: Medicare HMO

## 2021-07-08 ENCOUNTER — Other Ambulatory Visit (INDEPENDENT_AMBULATORY_CARE_PROVIDER_SITE_OTHER): Payer: Self-pay | Admitting: Family Medicine

## 2021-07-10 ENCOUNTER — Inpatient Hospital Stay: Payer: Medicare HMO | Attending: Hematology & Oncology

## 2021-07-10 ENCOUNTER — Encounter: Payer: Self-pay | Admitting: Hematology & Oncology

## 2021-07-10 ENCOUNTER — Other Ambulatory Visit: Payer: Self-pay

## 2021-07-10 ENCOUNTER — Inpatient Hospital Stay: Payer: Medicare HMO | Admitting: Hematology & Oncology

## 2021-07-10 VITALS — BP 157/69 | HR 71 | Temp 98.6°F | Resp 18 | Wt 268.0 lb

## 2021-07-10 DIAGNOSIS — I2699 Other pulmonary embolism without acute cor pulmonale: Secondary | ICD-10-CM | POA: Insufficient documentation

## 2021-07-10 DIAGNOSIS — Z86711 Personal history of pulmonary embolism: Secondary | ICD-10-CM

## 2021-07-10 DIAGNOSIS — I82402 Acute embolism and thrombosis of unspecified deep veins of left lower extremity: Secondary | ICD-10-CM | POA: Insufficient documentation

## 2021-07-10 DIAGNOSIS — Z7901 Long term (current) use of anticoagulants: Secondary | ICD-10-CM | POA: Insufficient documentation

## 2021-07-10 DIAGNOSIS — Z79899 Other long term (current) drug therapy: Secondary | ICD-10-CM | POA: Insufficient documentation

## 2021-07-10 DIAGNOSIS — M25562 Pain in left knee: Secondary | ICD-10-CM | POA: Insufficient documentation

## 2021-07-10 DIAGNOSIS — M7989 Other specified soft tissue disorders: Secondary | ICD-10-CM | POA: Diagnosis not present

## 2021-07-10 LAB — CMP (CANCER CENTER ONLY)
ALT: 10 U/L (ref 0–44)
AST: 12 U/L — ABNORMAL LOW (ref 15–41)
Albumin: 4.2 g/dL (ref 3.5–5.0)
Alkaline Phosphatase: 102 U/L (ref 38–126)
Anion gap: 8 (ref 5–15)
BUN: 19 mg/dL (ref 8–23)
CO2: 30 mmol/L (ref 22–32)
Calcium: 9.5 mg/dL (ref 8.9–10.3)
Chloride: 103 mmol/L (ref 98–111)
Creatinine: 1.44 mg/dL — ABNORMAL HIGH (ref 0.61–1.24)
GFR, Estimated: 50 mL/min — ABNORMAL LOW (ref >60)
Glucose, Bld: 102 mg/dL — ABNORMAL HIGH (ref 70–99)
Potassium: 4 mmol/L (ref 3.5–5.1)
Sodium: 141 mmol/L (ref 135–145)
Total Bilirubin: 0.8 mg/dL (ref 0.3–1.2)
Total Protein: 6.4 g/dL — ABNORMAL LOW (ref 6.5–8.1)

## 2021-07-10 LAB — CBC WITH DIFFERENTIAL (CANCER CENTER ONLY)
Abs Immature Granulocytes: 0.03 10*3/uL (ref 0.00–0.07)
Basophils Absolute: 0.1 10*3/uL (ref 0.0–0.1)
Basophils Relative: 1 %
Eosinophils Absolute: 0.3 10*3/uL (ref 0.0–0.5)
Eosinophils Relative: 3 %
HCT: 46.1 % (ref 39.0–52.0)
Hemoglobin: 14.7 g/dL (ref 13.0–17.0)
Immature Granulocytes: 0 %
Lymphocytes Relative: 19 %
Lymphs Abs: 1.8 10*3/uL (ref 0.7–4.0)
MCH: 28.6 pg (ref 26.0–34.0)
MCHC: 31.9 g/dL (ref 30.0–36.0)
MCV: 89.7 fL (ref 80.0–100.0)
Monocytes Absolute: 0.7 10*3/uL (ref 0.1–1.0)
Monocytes Relative: 7 %
Neutro Abs: 6.4 10*3/uL (ref 1.7–7.7)
Neutrophils Relative %: 70 %
Platelet Count: 181 10*3/uL (ref 150–400)
RBC: 5.14 MIL/uL (ref 4.22–5.81)
RDW: 15 % (ref 11.5–15.5)
WBC Count: 9.3 10*3/uL (ref 4.0–10.5)
nRBC: 0 % (ref 0.0–0.2)

## 2021-07-10 LAB — LACTATE DEHYDROGENASE: LDH: 146 U/L (ref 98–192)

## 2021-07-10 NOTE — Progress Notes (Signed)
Hematology and Oncology Follow Up Visit  Tanner [name] [id number] 1946-05-16 76 y.o. 07/10/2021   Principle Diagnosis:  Recurrent pulmonary emboli/left leg thrombus -- Idiopathic  Current Therapy:   Eliquis 5 mg p.o. twice daily - started 06/29/2017   Interim History: Tanner [name] is here today for follow-up.  Unfortunately, he is having problems with his left knee.  He apparently fell and the knee prosthetic broke.  He is having a lot of pain in the left knee.  There is a little bit of swelling.  He goes back to see the orthopedic surgeon to try to get this fixed.  He is on Eliquis.  He is doing well on the Eliquis.  He is on 5 mg p.o. twice daily.  We may want to think about changing his dose down a little bit.  However, given his knee issues right now, I probably would keep him where he is.  His weight has gone up a little bit.  I know this is been a problem for him.  He has had some chronic swelling in the legs.  I think he has compression stockings but I am not sure he wears these.  He has had no bleeding.  He has had no change in bowel or bladder habits.  Thankfully he has had no problems with COVID.  Overall, his performance status is ECOG 2.    Medications:  Allergies as of 07/10/2021       Reactions   Lisinopril    Other reaction(s): Renal impairment   Amlodipine Swelling   Codeine Sulfate Itching, Nausea Only        Medication List        Accurate as of July 10, 2021 11:41 AM. If you have any questions, ask your nurse or doctor.          acetaminophen 500 MG tablet Commonly known as: TYLENOL Take 1,000 mg by mouth every 6 (six) hours as needed for moderate pain.   acidophilus Caps capsule Take 1 capsule by mouth daily.   atenolol 25 MG tablet Commonly known as: TENORMIN Take 12.5 mg by mouth daily.   atorvastatin 80 MG tablet Commonly known as: LIPITOR Take 40 mg by mouth daily.   buPROPion 150 MG 12 hr tablet Commonly known as: WELLBUTRIN  SR Take 150 mg by mouth 2 (two) times daily.   Eliquis 5 MG Tabs tablet Generic drug: apixaban Take 5 mg by mouth 2 (two) times daily.   finasteride 5 MG tablet Commonly known as: PROSCAR Take 5 mg by mouth at bedtime.   furosemide 20 MG tablet Commonly known as: LASIX Take 2 tablets (40 mg total) by mouth 2 (two) times daily. What changed:  when to take this reasons to take this   gabapentin 300 MG capsule Commonly known as: NEURONTIN Take 300-600 mg by mouth See admin instructions. Take 600 mg by mouth three times daily and  300 mg at night   ketoconazole 2 % shampoo Commonly known as: NIZORAL Apply 1 application topically every other day.   multivitamins ther. w/minerals Tabs tablet Take 1 tablet by mouth daily.   neomycin-polymyxin-hydrocortisone 3.5-10000-1 OTIC suspension Commonly known as: CORTISPORIN Place 2 drops into both ears daily.   nortriptyline 10 MG capsule Commonly known as: PAMELOR 2 CAPSULES EVERY NIGHT AT BEDTIME What changed:  how much to take how to take this when to take this additional instructions   potassium chloride 10 MEQ tablet Commonly known as: KLOR-CON Take 4 tablets (40  mEq total) by mouth 2 (two) times daily. What changed:  how much to take when to take this   saccharomyces boulardii 250 MG capsule Commonly known as: FLORASTOR Take 1 capsule (250 mg total) by mouth 2 (two) times daily. What changed: when to take this   sildenafil 100 MG tablet Commonly known as: VIAGRA Take 100 mg by mouth as needed.   Thera-Gesic Plus Crea Apply 1 application topically at bedtime. Apply to lower back        Allergies:  Allergies  Allergen Reactions   Lisinopril     Other reaction(s): Renal impairment   Amlodipine Swelling   Codeine Sulfate Itching and Nausea Only    Past Medical History, Surgical history, Social history, and Family History were reviewed and updated.  Review of Systems: Review of Systems  Constitutional:  Negative.   HENT: Negative.    Eyes: Negative.   Respiratory: Negative.    Cardiovascular: Negative.   Gastrointestinal: Negative.   Genitourinary: Negative.   Musculoskeletal: Negative.   Skin: Negative.   Neurological: Negative.   Endo/Heme/Allergies: Negative.   Psychiatric/Behavioral: Negative.      Physical Exam:  weight is 268 lb 0.6 oz (121.6 kg). His oral temperature is 98.6 F (37 C). His blood pressure is 157/69 (abnormal) and his pulse is 71. His respiration is 18 and oxygen saturation is 99%.   Wt Readings from Last 3 Encounters:  07/10/21 268 lb 0.6 oz (121.6 kg)  01/13/21 264 lb 9.6 oz (120 kg)  01/01/21 262 lb (118.8 kg)    Physical Exam Vitals reviewed.  HENT:     Head: Normocephalic and atraumatic.  Eyes:     Pupils: Pupils are equal, round, and reactive to light.  Cardiovascular:     Rate and Rhythm: Normal rate and regular rhythm.     Heart sounds: Normal heart sounds.  Pulmonary:     Effort: Pulmonary effort is normal.     Breath sounds: Normal breath sounds.  Abdominal:     General: Bowel sounds are normal.     Palpations: Abdomen is soft.  Musculoskeletal:        General: No tenderness or deformity. Normal range of motion.     Cervical back: Normal range of motion.  Lymphadenopathy:     Cervical: No cervical adenopathy.  Skin:    General: Skin is warm and dry.     Findings: No erythema or rash.  Neurological:     Mental Status: He is alert and oriented to person, place, and time.  Psychiatric:        Behavior: Behavior normal.        Thought Content: Thought content normal.        Judgment: Judgment normal.     Lab Results  Component Value Date   WBC 9.3 07/10/2021   HGB 14.7 07/10/2021   HCT 46.1 07/10/2021   MCV 89.7 07/10/2021   PLT 181 07/10/2021   Lab Results  Component Value Date   FERRITIN 80 10/12/2019   IRON 86 10/12/2019   TIBC 297 10/12/2019   UIBC 211 10/12/2019   IRONPCTSAT 29 10/12/2019   Lab Results   Component Value Date   RETICCTPCT 2.5 10/12/2019   RBC 5.14 07/10/2021   No results found for: KPAFRELGTCHN, LAMBDASER, KAPLAMBRATIO Lab Results  Component Value Date   IGGSERUM 549 (L) 04/07/2017   IGMSERUM 144 04/07/2017   Lab Results  Component Value Date   ALBUMINELP 3.7 (L) 04/07/2017   A1GS 0.4 (H)  04/07/2017   A2GS 0.8 04/07/2017   BETS 0.5 04/07/2017   BETA2SER 0.3 04/07/2017   GAMS 0.6 (L) 04/07/2017   SPEI  04/07/2017     Comment:     . One or more serum protein fractions are outside the normal ranges.  No abnormal protein bands are apparent. .      Chemistry      Component Value Date/Time   NA 141 07/10/2021 1005   NA 143 01/23/2020 1603   NA 136 12/29/2013 1356   K 4.0 07/10/2021 1005   K 3.4 12/29/2013 1356   CL 103 07/10/2021 1005   CL 97 (L) 12/29/2013 1356   CO2 30 07/10/2021 1005   CO2 33 12/29/2013 1356   BUN 19 07/10/2021 1005   BUN 16 01/23/2020 1603   BUN 15 12/29/2013 1356   CREATININE 1.44 (H) 07/10/2021 1005   CREATININE 1.2 12/29/2013 1356   GLU 84 11/07/2014 0000      Component Value Date/Time   CALCIUM 9.5 07/10/2021 1005   CALCIUM 8.9 12/29/2013 1356   ALKPHOS 102 07/10/2021 1005   ALKPHOS 59 12/29/2013 1356   AST 12 (L) 07/10/2021 1005   ALT 10 07/10/2021 1005   ALT 27 12/29/2013 1356   BILITOT 0.8 07/10/2021 1005      Impression and Plan: Mr. Gayden is a very pleasant 76 yo caucasian gentleman with recurrent pulmonary embolism/thromboembolic disease.  He is on lifelong Eliquis.  Hopefully, the left knee can be taken care of.  I am sure that Dr. Mayer Camel of Orthopedic Surgery will be able to help out.  We will plan to get him back in 6 months.  At that time, we may think about decreasing his dose of Eliquis down to 2.5 mg p.o. twice daily.   Volanda Napoleon, MD 1/26/202311:41 AM

## 2021-07-17 ENCOUNTER — Ambulatory Visit (INDEPENDENT_AMBULATORY_CARE_PROVIDER_SITE_OTHER): Payer: Self-pay

## 2021-07-17 DIAGNOSIS — Z9889 Other specified postprocedural states: Secondary | ICD-10-CM | POA: Diagnosis not present

## 2021-07-17 DIAGNOSIS — S82002A Unspecified fracture of left patella, initial encounter for closed fracture: Secondary | ICD-10-CM | POA: Diagnosis not present

## 2021-07-18 ENCOUNTER — Other Ambulatory Visit: Payer: Self-pay

## 2021-07-18 ENCOUNTER — Other Ambulatory Visit (HOSPITAL_COMMUNITY): Payer: No Typology Code available for payment source

## 2021-07-18 ENCOUNTER — Ambulatory Visit: Payer: No Typology Code available for payment source | Attending: Medical | Admitting: Medical

## 2021-07-18 ENCOUNTER — Other Ambulatory Visit (INDEPENDENT_AMBULATORY_CARE_PROVIDER_SITE_OTHER): Payer: Self-pay | Admitting: Medical

## 2021-07-18 VITALS — BP 140/82 | HR 69 | Ht 71.0 in | Wt 266.0 lb

## 2021-07-18 DIAGNOSIS — I10 Essential (primary) hypertension: Secondary | ICD-10-CM | POA: Insufficient documentation

## 2021-07-18 DIAGNOSIS — I4892 Unspecified atrial flutter: Secondary | ICD-10-CM

## 2021-07-18 DIAGNOSIS — I480 Paroxysmal atrial fibrillation: Secondary | ICD-10-CM | POA: Insufficient documentation

## 2021-07-18 DIAGNOSIS — E782 Mixed hyperlipidemia: Secondary | ICD-10-CM

## 2021-07-18 LAB — THYROID STIMULATING HORMONE (SENSITIVE TSH): TSH: 2.114 u[IU]/mL (ref 0.450–5.330)

## 2021-07-18 MED ORDER — SIMVASTATIN 20 MG TABLET
20.0000 mg | ORAL_TABLET | Freq: Every evening | ORAL | 3 refills | Status: DC
Start: 2021-07-18 — End: 2022-07-21

## 2021-07-18 NOTE — Progress Notes (Signed)
[facility]  CARDIOLOGY, Sharlyne Cai  [address]  [place] [address]  [phone]    Thomas [name] is a 76 y.o. male seen in the office on 07/18/2021    Chief Complaint   Patient presents with   . Follow Up 6 Months     Atrial flutter          Patient Active Problem List   Diagnosis   . Atrial flutter (CMS HCC)   . Body mass index (BMI) of 33.0 to 33.9 in adult   . Chest tightness   . Essential hypertension   . Hyperlipidemia   . Left bundle branch hemiblock   . Paroxysmal atrial flutter (CMS HCC)   . History of cardioversion   . History of cardiac cath-Overall normal coronary anatomy 12/26/2018   . Cardiac left ventricular ejection fraction 55-60% on TEE 12/26/18       History of Present Illness: This is 76 year old male patient with a past medical history of paroxysmal atrial flutter/fib, hypertension, hyperlipidemia, gout, and chronic shortness of breath-patient is to see pulmonology in this regard. He was seen one time byDr. Kristen Cardinal on05/08/2019secondary to atrial flutter.Holter monitor, echocardiogram, and stress test werecompleted in the past. Patient was seen at our office to establish care.  He was complaining of chest pain that radiated into his left arm and palpitations.  Nuclear stress test and Holter monitor was ordered.  Patient underwent nuclear stress test which returned abnormal.  Holter monitor was never completed.  Cardiac catheterization was completed 12/26/2018 which revealed overall normal coronary anatomy with an estimated ejection fraction of 55%.  Dr. Francie Massing recommended TEE/DC cardioversion as he was in atrial fibrillation/atrial flutter.  TEE was completed revealing an EF of 55-60%.  No evidence of thrombus was identified.  The patient was successfully cardioverted to normal sinus rhythm and initiated on flecainide.  When the patient presented for follow-up following TEE/DC cardioversion he was back in atrial fibrillation.  The  patient wished to continue with rate control and anticoagulation.  His flecainide was discontinued.  Since then, he did convert back to normal sinus and his flecainide was re-initiated.  He has continued to go in and out of atrial fibrillation/flutter since then.    On recent office visit, patient had a complaint of "heart flutters".  He was ordered a 7 day event monitor.  This was completing showing rare ventricular ectopic beats with no significant complex ectopy.  No cardiac arrhythmias we were identified.  Minimal heart rate was 40 beats per minute with maximum heart rate of 82 beats per minute and average heart rate 53.  He presents today for results of event monitor.  He presents with a list of questions with regard to his current cardiac therapies as well as is Holter monitor.  His previous visit he was initiated on hydralazine 10 mg 3 times daily.  He tells me that his blood pressures have predominantly been around 140 systolic lead.  He has had some difficulties with his pulmonary therapies as some of his metered dose inhalers caused increase in diastolic pressures.  He denies chest pain, palpitations or dizziness.  Is having some difficulties with neck pain and range of motion in the cervical spine.  His vital signs are borderline upon arrival.  Patient voices no concerns.  Current Outpatient Medications   Medication Sig   . acetaminophen (TYLENOL) 500 mg Oral Tablet Take 1 Tablet (500 mg total) by mouth Every 4 hours as needed for  Pain (pt takes tylenol pm)   . albuterol (PROVENTIL) 2.5 mg/0.5 mL Inhalation Solution for Nebulization Take 0.5 mL (2.5 mg total) by nebulization Every 4 hours as needed for up to 90 days   . albuterol sulfate (PROVENTIL) 2.5 mg /3 mL (0.083 %) Inhalation nebulizer solution    . apixaban (ELIQUIS) 5 mg Oral Tablet Take 1 Tablet (5 mg total) by mouth   . dilTIAZem (CARDIZEM CD) 240 mg Oral Capsule, Sust. Release 24 hr TAKE ONE CAPSULE BY MOUTH EVERY DAY   . flecainide  (TAMBOCOR) 100 mg Oral Tablet TAKE ONE TABLET BY MOUTH TWICE DAILY   . furosemide (LASIX) 40 mg Oral Tablet TAKE ONE TABLET BY MOUTH EVERY DAY   . hydrALAZINE (APRESOLINE) 10 mg Oral Tablet TAKE ONE TABLET BY MOUTH THREE TIMES DAILY   . lisinopriL (PRINIVIL) 40 mg Oral Tablet Take 1 Tablet (40 mg total) by mouth Once a day   . nitroGLYCERIN (NITROSTAT) 0.4 mg Sublingual Tablet, Sublingual Place 1 Tablet (0.4 mg total) under the tongue Every 5 minutes as needed for Chest pain   . potassium chloride (KLOR-CON) 10 mEq Oral Tablet Sustained Release TAKE ONE TABLET BY MOUTH EVERY DAY   . simvastatin (ZOCOR) 20 mg Oral Tablet Take 1 Tablet (20 mg total) by mouth Every night       No Known Allergies  Family Medical History:     Problem Relation (Age of Onset)    Cancer Mother    Hypertension (High Blood Pressure) Mother, Father            Social History     Socioeconomic History   . Marital status: Widowed   Tobacco Use   . Smoking status: Former     Types: Cigars   . Smokeless tobacco: Current     Types: Chew, Snuff   Vaping Use   . Vaping Use: Never used   Substance and Sexual Activity   . Alcohol use: Yes     Comment: occasional   . Drug use: Never   . Sexual activity: Not Currently     Social Determinants of Health     Financial Resource Strain: Low Risk    . Difficulty of Paying Living Expenses: Not hard at all   Food Insecurity: No Food Insecurity   . Worried About Programme researcher, broadcasting/film/video in the Last Year: Never true   . Ran Out of Food in the Last Year: Never true   Transportation Needs: No Transportation Needs   . Lack of Transportation (Medical): No   . Lack of Transportation (Non-Medical): No   Physical Activity: Insufficiently Active   . Days of Exercise per Week: 3 days   . Minutes of Exercise per Session: 30 min   Stress: No Stress Concern Present   . Feeling of Stress : Not at all   Intimate Partner Violence: Not At Risk   . Fear of Current or Ex-Partner: No   . Emotionally Abused: No   . Physically Abused: No    . Sexually Abused: No   Housing Stability: Low Risk    . Unable to Pay for Housing in the Last Year: No   . Number of Places Lived in the Last Year: 1   . Unstable Housing in the Last Year: No         Physical Exam:  BP (!) 140/82   Pulse 69   Ht 1.803 m (5\' 11" )   Wt 121 kg (266 lb)   SpO2  96%   BMI 37.10 kg/m       Body mass index is 37.1 kg/m.  Wt Readings from Last 5 Encounters:   07/18/21 121 kg (266 lb)   06/23/21 120 kg (265 lb)   05/12/21 121 kg (267 lb)   04/29/21 121 kg (267 lb 3.2 oz)   04/15/21 122 kg (269 lb)     The patient is in no distress. Skin is warm and dry. There is no neck vein distention. No carotid bruits heard. The lungs are clear bilaterally. The heart is irregular without any significant murmur, gallop,rub,or click.  The abdomen is obese, normoactive, soft and non-tender. The aorta is not palpable. Extremities show no significant edema.  Distal pulses are intact.      PERTINENT DIAGNOSTICS: EKG today sinus bradycardia 49 beats per minute.  Patient is asymptomatic with regard to dizziness.  He has a positive chronotropic response when up about and moving.  No adjustments in medical therapies today.    IMPRESSION:  1. Paroxysmal atrial flutter (CMS HCC)    2. Atrial flutter, unspecified type (CMS HCC)    3. Mixed hyperlipidemia    4. Essential hypertension    5. Paroxysmal atrial fibrillation (CMS HCC)          PLAN:  Patient presents today for results of Holter monitor.  This showed PVCs and PACs but did not show any dysrhythmia or recurrent atrial fibrillation.  I have educated patient is regard.  Upon arrival today he is bradycardic at heart rate of 49 however remains asymptomatic.  His ventricular rate does increase with activity therefore have made no adjustments in his rate controlling therapies.  His blood pressure remains borderline and I have educated patient on the need to lower his systolic pressure.  I have increased his hydralazine from 10 mg to 25 mg 3 times daily.  He  will continue to monitor his pressures at home and phone our office with results.  With regard to his neck pain I have encouraged patient to follow-up with his PCP.  At today's visit he is wearing shorts and a top and states that he is often overheated.  I have ordered TSH for further evaluation and to rule out endocrine source.  I will have him back in 3 months for short-term visit due to medication changes.  I have encouraged patient phone with any questions, concerns or to obtain earlier appointment if needed.  Dr. Francie MassingAbbasi was readily available for discussion with regard to this patient's care.  Orders Placed This Encounter   . THYROID STIMULATING HORMONE (SENSITIVE TSH)   . EKG - In Clinic, Same Day (93000)   . simvastatin (ZOCOR) 20 mg Oral Tablet

## 2021-07-19 DIAGNOSIS — I10 Essential (primary) hypertension: Secondary | ICD-10-CM

## 2021-07-19 DIAGNOSIS — E782 Mixed hyperlipidemia: Secondary | ICD-10-CM

## 2021-07-19 DIAGNOSIS — I4892 Unspecified atrial flutter: Secondary | ICD-10-CM

## 2021-07-19 LAB — ECG W INTERP (CLINIC ONLY)
Atrial Rate: 49 {beats}/min
Calculated P Axis: 34 degrees
Calculated R Axis: -72 degrees
Calculated T Axis: 64 degrees
PR Interval: 200 ms
QRS Duration: 134 ms
QT Interval: 480 ms
QTC Calculation: 433 ms
Ventricular rate: 49 {beats}/min

## 2021-07-30 ENCOUNTER — Encounter (HOSPITAL_COMMUNITY): Payer: Self-pay | Admitting: PULMONARY DISEASE

## 2021-07-30 ENCOUNTER — Other Ambulatory Visit: Payer: Self-pay

## 2021-07-30 ENCOUNTER — Ambulatory Visit: Payer: No Typology Code available for payment source | Attending: PULMONARY DISEASE | Admitting: PULMONARY DISEASE

## 2021-07-30 VITALS — BP 136/70 | HR 52 | Temp 97.9°F | Resp 19 | Ht 71.0 in | Wt 260.0 lb

## 2021-07-30 DIAGNOSIS — Z87891 Personal history of nicotine dependence: Secondary | ICD-10-CM

## 2021-07-30 DIAGNOSIS — E669 Obesity, unspecified: Secondary | ICD-10-CM

## 2021-07-30 DIAGNOSIS — G4733 Obstructive sleep apnea (adult) (pediatric): Secondary | ICD-10-CM | POA: Insufficient documentation

## 2021-07-30 DIAGNOSIS — I4891 Unspecified atrial fibrillation: Secondary | ICD-10-CM

## 2021-07-30 DIAGNOSIS — I10 Essential (primary) hypertension: Secondary | ICD-10-CM

## 2021-07-30 DIAGNOSIS — G4730 Sleep apnea, unspecified: Secondary | ICD-10-CM

## 2021-07-30 NOTE — Patient Instructions (Signed)
Sleep study  Follow up in three months with Dr. Mike Craze

## 2021-07-30 NOTE — Progress Notes (Signed)
[facility]  Pulmonary Office Follow Up      Date of Service:  07/30/2021  Thomas [name], Thomas [name], 76 y.o. male  Date of Birth:  21-Aug-1945  PCP: Trenton Gammon, DO    IMPRESSION:  76 year old male  Chronic obstructive pulmonary disease  Elevated left hemidiaphragm  Suspected sleep apnea  Former smoker  Secondhand Smoke exposure  Obesity  Atrial fibrillation/flutter with history of cardioversion  Hypertension  Left bundle branch block     RECOMMENDATIONS:  Sleep study  Follow up in three months with Dr. Ricka Burdock      SUBJECTIVE:  The patient has continued to have labored breathing, unchanged from prior.  He is not currently on inhaler therapy.  He reports sleeping 6-7 hours per night.  He is using Lasix with decreased edema.  He is not been on prednisone recently.  He reports having had COVID in June of 2022.  He has occasional remaining cough with no other complaints.  There is underlying possibility for obstructive sleep apnea, based on our conversation today.  He has not had a recent evaluation.    OBJECTIVE:  @IOBRIEF @    Current Meds:   No current facility-administered medications for this visit.     Allergies   Allergen Reactions    Flomax [Tamsulosin] Nausea/ Vomiting       Labs:    No results found for this or any previous visit (from the past 24 hour(s)).   @RECENTMICRO @     Imaging Studies:          Examination:  Temperature: 36.6 C (97.9 F) Heart Rate: 52 BP (Non-Invasive): 136/70   Respiratory Rate: 19 SpO2: 94 %       Temperature: 36.6 C (97.9 F)  Heart Rate: 52  BP (Non-Invasive): 136/70  Respiratory Rate: 19  SpO2: 94 %  General: Seated, in no acute distress  HEENT: Atraumatic normocephalic.   Neck: Trachea in the midline.  Cardiovascular: S1-S2 regular. No murmur.  Lungs: Good air entry bilaterally. No rales rhonchi or wheezing.  Abdomen: Non tender   Musculoskeletal: No joint swelling or erythema.  Extremities: No clubbing or cyanosis.  Skin: Dry, warm to touch.  Neurological: Awake, alert , oriented.  Nonfocal.  Psychiatric: Normal mood and affect.    Total time spent on encounter was 42 minutes. In addition to face to face time with the patient performing history and exam, this includes time spent reviewing records, reviewing prior imaging, reviewing prior cardiac studies when available, reviewing prior outpatient notes when available (as referenced above), coordinating with office staff and time spent performing final documentation in the medical record.  Eda Keys, MD

## 2021-08-04 ENCOUNTER — Telehealth: Payer: Medicare HMO

## 2021-08-04 ENCOUNTER — Other Ambulatory Visit (INDEPENDENT_AMBULATORY_CARE_PROVIDER_SITE_OTHER): Payer: Self-pay | Admitting: Family Medicine

## 2021-08-04 ENCOUNTER — Other Ambulatory Visit: Payer: Self-pay

## 2021-08-04 ENCOUNTER — Ambulatory Visit (INDEPENDENT_AMBULATORY_CARE_PROVIDER_SITE_OTHER): Payer: Medicare HMO

## 2021-08-04 DIAGNOSIS — E1169 Type 2 diabetes mellitus with other specified complication: Secondary | ICD-10-CM

## 2021-08-04 DIAGNOSIS — Z86711 Personal history of pulmonary embolism: Secondary | ICD-10-CM

## 2021-08-04 DIAGNOSIS — N1831 Chronic kidney disease, stage 3a: Secondary | ICD-10-CM

## 2021-08-04 DIAGNOSIS — I2510 Atherosclerotic heart disease of native coronary artery without angina pectoris: Secondary | ICD-10-CM

## 2021-08-04 DIAGNOSIS — I10 Essential (primary) hypertension: Secondary | ICD-10-CM

## 2021-08-04 NOTE — Progress Notes (Unsigned)
Chronic Care Management Pharmacy Note  08/04/2021 Name:  Tanner [name] MRN:  [id number] DOB:  [birth year]  Subjective: Tanner [name] is an 76 y.o. year old male who is a primary patient of Myrlene Broker, MD.  The CCM team was consulted for assistance with disease management and care coordination needs.    Engaged with patient by telephone for follow up visit in response to provider referral for pharmacy case management and/or care coordination services.   Consent to Services:  The patient was given information about Chronic Care Management services, agreed to services, and gave verbal consent prior to initiation of services.  Please see initial visit note for detailed documentation.   Patient Care Team: Myrlene Broker, MD as PCP - General (Internal Medicine) Kathyrn Sheriff, [facility] as Pharmacist (Pharmacist)  Recent office visits: 01/13/2021 - Dr. Okey Dupre - hold atenolol, continue hold of DM medications - A1c at goal - follow up in 6 months   Recent consult visits: 07/10/2021 - Dr. Myna Hidalgo - Oncology - continue current dose of eliquis - possible after knee surgery could reduce to 2.5mg  BID with eliquis - follow up in 6 months  06/19/2021 - Dr. Jarold Motto [facility] - increase atenolol to 25mg  daily - continue furosemide 05/27/2021 - Dr. 05/29/2021 - Orthopedic Surgery - knee pain - notes not available  03/04/2021 - Dr. 03/06/2021 - Orthopedic Surgery - notes not available  02/27/2021 - Dr. 03/01/2021 - dermatology - notes not available  02/07/2021 - Dr. 02/09/2021  - dermatology - notes not available   Hospital visits: None in previous 6 months   Objective:  Lab Results  Component Value Date   CREATININE 1.44 (H) 07/10/2021   BUN 19 07/10/2021   GFR 48.19 (L) 12/12/2019   GFRNONAA 50 (L) 07/10/2021   GFRAA 55 (L) 01/23/2020   NA 141 07/10/2021   K 4.0 07/10/2021   CALCIUM 9.5 07/10/2021   CO2 30 07/10/2021   GLUCOSE 102 (H) 07/10/2021    Lab Results  Component Value Date/Time    HGBA1C 5.6 01/13/2021 09:23 AM   HGBA1C 5.1 08/20/2020 01:26 PM   HGBA1C 5.4 04/15/2020 12:00 AM   HGBA1C 8.1 (H) 10/12/2019 02:59 PM   GFR 48.19 (L) 12/12/2019 02:43 PM   GFR 63.06 11/29/2017 01:32 PM    Last diabetic Eye exam: No results found for: HMDIABEYEEXA  Last diabetic Foot exam: No results found for: HMDIABFOOTEX   Lab Results  Component Value Date   CHOL 85 04/15/2020   HDL 33 (A) 04/15/2020   LDLCALC 43 04/15/2020   LDLDIRECT 65.9 09/13/2007   TRIG 122 04/15/2020   CHOLHDL 3.6 10/12/2019    Hepatic Function Latest Ref Rng & Units 07/10/2021 01/01/2021 07/04/2020  Total Protein 6.5 - 8.1 g/dL 6.4(L) 6.2(L) 6.5  Albumin 3.5 - 5.0 g/dL 4.2 3.9 4.0  AST 15 - 41 U/L 12(L) 11(L) 28  ALT 0 - 44 U/L 10 10 26   Alk Phosphatase 38 - 126 U/L 102 95 91  Total Bilirubin 0.3 - 1.2 mg/dL 0.8 0.7 0.5  Bilirubin, Direct 0.0 - 0.3 mg/dL - - -    Lab Results  Component Value Date/Time   TSH 6.45 (H) 01/13/2021 09:22 AM   TSH 3.050 10/12/2019 02:59 PM   FREET4 1.35 10/12/2019 02:59 PM    CBC Latest Ref Rng & Units 07/10/2021 01/01/2021 08/20/2020  WBC 4.0 - 10.5 K/uL 9.3 7.4 8.8  Hemoglobin 13.0 - 17.0 g/dL 01/03/2021 10/20/2020 21.2  Hematocrit 39.0 -  52.0 % 46.1 42.2 48.8  Platelets 150 - 400 K/uL 181 174 174    Lab Results  Component Value Date/Time   VD25OH 45.21 01/13/2021 09:22 AM   VD25OH 51 04/15/2020 12:00 AM   VD25OH 32.6 10/12/2019 02:59 PM   VD25OH 29.47 (L) 11/29/2017 01:32 PM    Clinical ASCVD: Yes  The ASCVD Risk score (Arnett DK, et al., 2019) failed to calculate for the following reasons:   The valid total cholesterol range is 130 to 320 mg/dL    Depression screen [facility] 2/9 01/13/2021 10/12/2019 09/24/2016  Decreased Interest 0 3 0  Down, Depressed, Hopeless 0 3 0  PHQ - 2 Score 0 6 0  Altered sleeping 0 0 -  Tired, decreased energy 0 3 -  Change in appetite 0 1 -  Feeling bad or failure about yourself  0 1 -  Trouble concentrating 0 1 -  Moving slowly or  fidgety/restless 0 0 -  Suicidal thoughts 0 0 -  PHQ-9 Score 0 12 -  Difficult doing work/chores - Not difficult at all -  Some recent data might be hidden     Social History   Tobacco Use  Smoking Status Former   Packs/day: 2.00   Years: 33.00   Pack years: 66.00   Types: Cigarettes   Start date: 08/30/1968   Quit date: 07/02/2001   Years since quitting: 20.1  Smokeless Tobacco Never  Tobacco Comments   quit 12 years ago   BP Readings from Last 3 Encounters:  07/10/21 (!) 157/69  01/13/21 132/80  01/01/21 (!) 124/56   Pulse Readings from Last 3 Encounters:  07/10/21 71  01/13/21 77  01/01/21 70   Wt Readings from Last 3 Encounters:  07/10/21 268 lb 0.6 oz (121.6 kg)  01/13/21 264 lb 9.6 oz (120 kg)  01/01/21 262 lb (118.8 kg)   BMI Readings from Last 3 Encounters:  07/10/21 43.93 kg/m  01/13/21 43.36 kg/m  01/01/21 42.94 kg/m    Assessment/Interventions: Review of patient past medical history, allergies, medications, health status, including review of consultants reports, laboratory and other test data, was performed as part of comprehensive evaluation and provision of chronic care management services.   SDOH:  (Social Determinants of Health) assessments and interventions performed: Yes  SDOH Screenings   Alcohol Screen: Not on file  Depression (PHQ2-9): Low Risk    PHQ-2 Score: 0  Financial Resource Strain: Not on file  Food Insecurity: Not on file  Housing: Not on file  Physical Activity: Not on file  Social Connections: Not on file  Stress: Not on file  Tobacco Use: Medium Risk   Smoking Tobacco Use: Former   Smokeless Tobacco Use: Never   Passive Exposure: Not on file  Transportation Needs: Not on file    [place]  Allergies  Allergen Reactions   Lisinopril     Other reaction(s): Renal impairment   Amlodipine Swelling   Codeine Sulfate Itching and Nausea Only    Medications Reviewed Today     Reviewed by Fabio Neighbors, LPN  (Licensed Practical Nurse) on 07/10/21 at 1109  Med List Status: <None>   Medication Order Taking? Sig Documenting Provider Last Dose Status Informant  acetaminophen (TYLENOL) 500 MG tablet [id number] Yes Take 1,000 mg by mouth every 6 (six) hours as needed for moderate pain. Norins, Heinz Knuckles, MD Taking Active Self  acidophilus (RISAQUAD) CAPS capsule [id number] Yes Take 1 capsule by mouth daily. [provider] Taking Active Self  apixaban (  ELIQUIS) 5 MG TABS tablet [id number] Yes Take 5 mg by mouth 2 (two) times daily. [provider] Taking Active   atenolol (TENORMIN) 25 MG tablet [id number] Yes Take 12.5 mg by mouth daily. [provider] Taking Active Self  atorvastatin (LIPITOR) 80 MG tablet [id number] Yes Take 40 mg by mouth daily. [provider] Taking Active Self  buPROPion (WELLBUTRIN SR) 150 MG 12 hr tablet [id number] Yes Take 150 mg by mouth 2 (two) times daily. [provider] Taking Active Self  finasteride (PROSCAR) 5 MG tablet [id number] Yes Take 5 mg by mouth at bedtime. [provider] Taking Active Self  furosemide (LASIX) 20 MG tablet [id number] Yes Take 2 tablets (40 mg total) by mouth 2 (two) times daily.  Patient taking differently: Take 40 mg by mouth as needed.   Helane Rima, DO Taking Active Self  gabapentin (NEURONTIN) 300 MG capsule [id number] Yes Take 300-600 mg by mouth See admin instructions. Take 600 mg by mouth three times daily and  300 mg at night [provider] Taking Active Self  ketoconazole (NIZORAL) 2 % shampoo [id number] Yes Apply 1 application topically every other day. [provider] Taking Active Self  Menthol-Methyl Salicylate (THERA-GESIC PLUS) CREA [id number] Yes Apply 1 application topically at bedtime. Apply to lower back [provider] Taking Active Self  Multiple Vitamins-Minerals (MULTIVITAMINS THER. W/MINERALS) Evelena Asa [id number] Yes Take 1 tablet by mouth daily. [provider] Taking Active Self  neomycin-polymyxin-hydrocortisone (CORTISPORIN) 3.5-10000-1 OTIC suspension [id number] Yes Place 2 drops into both ears daily. [provider] Taking Active Self  nortriptyline (PAMELOR) 10 MG capsule [id number] Yes 2 CAPSULES EVERY NIGHT AT BEDTIME  Patient taking differently: Take 20 mg by mouth at bedtime.   Glendale Chard, DO Taking Active Self  potassium chloride (KLOR-CON) 10 MEQ tablet [id number] Yes Take 4 tablets (40 mEq total) by mouth 2 (two) times daily.  Patient taking differently: Take 20 mEq by mouth daily.   Helane Rima, DO Taking Active Self  saccharomyces boulardii (FLORASTOR) 250 MG capsule [id number] Yes Take 1 capsule (250 mg total) by mouth 2 (two) times daily.  Patient taking differently: Take 250 mg by mouth daily.   Rolly Salter, MD Taking Active Self  sildenafil (VIAGRA) 100 MG tablet [id number] Yes Take 100 mg by mouth as needed. [provider] Taking Active   Med List Note Arnette Norris, CPhT 08/21/11 1042): [facility]            Patient Active Problem List   Diagnosis Date Noted   ED (erectile dysfunction) 01/13/2021   Stiffness of left hand joint 01/13/2021   Joint stiffness of hand, right 01/13/2021   Cyst, dermoid, scalp and neck 01/13/2021   Hypertension associated with type 2 diabetes mellitus (HCC) 05/23/2020   Chronic kidney disease (CKD), stage III (moderate) (HCC) 12/13/2019   Carpal tunnel syndrome 12/05/2019   Elevated creatine kinase 11/15/2019   Aortic atherosclerosis (HCC) 11/15/2019   Recurrent Clostridium difficile diarrhea 05/28/2017   Peripheral neuropathy 03/17/2017   Diabetes (HCC) 12/11/2016   Allergic rhinitis 12/11/2016   Degenerative arthritis of left knee 01/30/2016   Class 3 severe obesity with serious comorbidity and body mass index (BMI) of 45.0 to 49.9 in adult (HCC) 01/30/2016   History of pulmonary embolus (PE) 12/19/2013   Anxiety state 03/29/2007   Major  depressive disorder 03/29/2007   OSA (obstructive sleep apnea) 03/29/2007   Unspecified glaucoma 03/29/2007   BPH (benign prostatic hyperplasia) 03/29/2007  OA (osteoarthritis) of knee 03/29/2007   Essential hypertension 03/29/2007   History of Bell's palsy 03/29/2007    Immunization History  Administered Date(s) Administered   Fluad Quad(high Dose 65+) 02/22/2019, 04/15/2020   H1N1 05/25/2008   Influenza, High Dose Seasonal PF 02/26/2016, 03/17/2017, 02/28/2018   Influenza, Seasonal, Injecte, Preservative Fre 04/02/2009, 04/03/2010, 03/24/2013, 04/06/2014   Influenza,inj,Quad PF,6+ Mos 03/15/2015, 02/25/2021   Influenza-Unspecified 02/16/2008, 03/11/2011, 05/06/2012, 02/14/2015, 03/31/2016, 03/15/2017, 02/22/2019   Moderna Sars-Covid-2 Vaccination 07/31/2019, 08/29/2019, 05/07/2020   Pneumococcal Conjugate-13 09/11/2013, 03/15/2015   Pneumococcal Polysaccharide-23 04/03/2010   Td 04/15/2020   Tdap 11/27/2008   Zoster, Live 02/05/2010    Conditions to be addressed/monitored:  Hypertension, Hyperlipidemia, Diabetes, Coronary Artery Disease, Depression and BPH   There are no care plans that you recently modified to display for this patient.     Medication Assistance: None required.  Patient affirms current coverage meets needs.  Patient's preferred pharmacy is:  [id number] - [place], [address] [place] [address] Phone: [phone] Fax: [phone]  Uses pill box? Yes Pt endorses 100% compliance  We discussed: Current pharmacy is preferred with insurance plan and patient is satisfied with pharmacy services Patient decided to: Continue current medication management strategy  Care Plan and Follow Up Patient Decision:  Patient agrees to Care Plan and Follow-up.  Plan: Telephone follow up appointment with care management team member scheduled for:  6 months  ***

## 2021-08-04 NOTE — Progress Notes (Cosign Needed)
Chronic Care Management Pharmacy Note  Summary: -Patient reports that he is working with Dr. Mayer Camel on setting a date for knee procedure - recent fall - increased knee pain -Since falling and injuring his knee - BP has been elevated - lowest pt has recorded his BP was 155/75 - checked today in office and was 173/82 HR 64 -Patient reports that he does not take furosemide every day - only takes on days that he is staying in his home - last dose was Wednesday last week  Recommendations/Changes made from today's visit: -Discussed with patient about increasing adherence to furosemide dosing, also recommending for addition of hydralazine $RemoveBefore'10mg'BMtwOKkSQyuoq$  BID  - patient has follow up with his [place] provider next month to discuss BP (atenolol increased to $RemoveBefo'25mg'AyBTQASVWMB$  daily with last visit)  Plan: -F/u in 2 months    08/04/2021 Name:  Tanner [name] MRN:  [id number] DOB:  1945-11-29  Subjective: Tanner [name] is an 76 y.o. year old male who is a primary patient of Hoyt Koch, MD.  The CCM team was consulted for assistance with disease management and care coordination needs.    Engaged with patient face to face for follow up visit in response to provider referral for pharmacy case management and/or care coordination services.   Consent to Services:  The patient was given information about Chronic Care Management services, agreed to services, and gave verbal consent prior to initiation of services.  Please see initial visit note for detailed documentation.   Patient Care Team: Hoyt Koch, MD as PCP - General (Internal Medicine) Tomasa Blase, [facility] (Pharmacist)  Recent office visits: 01/13/2021 - [name] - hold atenolol, continue hold of DM medications - A1c at goal - follow up in 6 months   Recent consult visits: 07/10/2021 - Dr. Marin Olp - Oncology - continue current dose of eliquis - possible after knee surgery could reduce to 2.$RemoveBef'5mg'PyAVHRwTpo$  BID with eliquis - follow up in 6 months  06/19/2021 -  Dr. Sharlett Iles - [facility] - increase atenolol to $RemoveBef'25mg'CcVVqpKQhA$  daily - continue furosemide 05/27/2021 - Dr. Mayer Camel - Orthopedic Surgery - knee pain - notes not available  03/04/2021 - Dr. Mayer Camel - Orthopedic Surgery - notes not available  02/27/2021 - Dr. [place] Crane - dermatology - notes not available  02/07/2021 - Dr. [place] Crane  - dermatology - notes not available   Hospital visits: None in previous 6 months   Objective:  Lab Results  Component Value Date   CREATININE 1.44 (H) 07/10/2021   BUN 19 07/10/2021   GFR 48.19 (L) 12/12/2019   GFRNONAA 50 (L) 07/10/2021   GFRAA 55 (L) 01/23/2020   NA 141 07/10/2021   K 4.0 07/10/2021   CALCIUM 9.5 07/10/2021   CO2 30 07/10/2021   GLUCOSE 102 (H) 07/10/2021    Lab Results  Component Value Date/Time   HGBA1C 5.6 01/13/2021 09:23 AM   HGBA1C 5.1 08/20/2020 01:26 PM   HGBA1C 5.4 04/15/2020 12:00 AM   HGBA1C 8.1 (H) 10/12/2019 02:59 PM   GFR 48.19 (L) 12/12/2019 02:43 PM   GFR 63.06 11/29/2017 01:32 PM    Last diabetic Eye exam: No results found for: HMDIABEYEEXA  Last diabetic Foot exam: No results found for: HMDIABFOOTEX   Lab Results  Component Value Date   CHOL 85 04/15/2020   HDL 33 (A) 04/15/2020   LDLCALC 43 04/15/2020   LDLDIRECT 65.9 09/13/2007   TRIG 122 04/15/2020   CHOLHDL 3.6 10/12/2019    Hepatic Function Latest Ref Rng &  Units 07/10/2021 01/01/2021 07/04/2020  Total Protein 6.5 - 8.1 g/dL 6.4(L) 6.2(L) 6.5  Albumin 3.5 - 5.0 g/dL 4.2 3.9 4.0  AST 15 - 41 U/L 12(L) 11(L) 28  ALT 0 - 44 U/L $Remo'10 10 26  'SNZMJ$ Alk Phosphatase 38 - 126 U/L 102 95 91  Total Bilirubin 0.3 - 1.2 mg/dL 0.8 0.7 0.5  Bilirubin, Direct 0.0 - 0.3 mg/dL - - -    Lab Results  Component Value Date/Time   TSH 6.45 (H) 01/13/2021 09:22 AM   TSH 3.050 10/12/2019 02:59 PM   FREET4 1.35 10/12/2019 02:59 PM    CBC Latest Ref Rng & Units 07/10/2021 01/01/2021 08/20/2020  WBC 4.0 - 10.5 K/uL 9.3 7.4 8.8  Hemoglobin 13.0 - 17.0 g/dL 14.7 13.2 15.5  Hematocrit 39.0 - 52.0 % 46.1 42.2  48.8  Platelets 150 - 400 K/uL 181 174 174    Lab Results  Component Value Date/Time   VD25OH 45.21 01/13/2021 09:22 AM   VD25OH 51 04/15/2020 12:00 AM   VD25OH 32.6 10/12/2019 02:59 PM   VD25OH 29.47 (L) 11/29/2017 01:32 PM    Clinical ASCVD: Yes  The ASCVD Risk score (Arnett DK, et al., 2019) failed to calculate for the following reasons:   The valid total cholesterol range is 130 to 320 mg/dL    Depression screen [facility] 2/9 01/13/2021 10/12/2019 09/24/2016  Decreased Interest 0 3 0  Down, Depressed, Hopeless 0 3 0  PHQ - 2 Score 0 6 0  Altered sleeping 0 0 -  Tired, decreased energy 0 3 -  Change in appetite 0 1 -  Feeling bad or failure about yourself  0 1 -  Trouble concentrating 0 1 -  Moving slowly or fidgety/restless 0 0 -  Suicidal thoughts 0 0 -  PHQ-9 Score 0 12 -  Difficult doing work/chores - Not difficult at all -  Some recent data might be hidden     Social History   Tobacco Use  Smoking Status Former   Packs/day: 2.00   Years: 33.00   Pack years: 66.00   Types: Cigarettes   Start date: 08/30/1968   Quit date: 07/02/2001   Years since quitting: 20.1  Smokeless Tobacco Never  Tobacco Comments   quit 12 years ago   BP Readings from Last 3 Encounters:  07/10/21 (!) 157/69  01/13/21 132/80  01/01/21 (!) 124/56   Pulse Readings from Last 3 Encounters:  07/10/21 71  01/13/21 77  01/01/21 70   Wt Readings from Last 3 Encounters:  07/10/21 268 lb 0.6 oz (121.6 kg)  01/13/21 264 lb 9.6 oz (120 kg)  01/01/21 262 lb (118.8 kg)   BMI Readings from Last 3 Encounters:  07/10/21 43.93 kg/m  01/13/21 43.36 kg/m  01/01/21 42.94 kg/m    Assessment/Interventions: Review of patient past medical history, allergies, medications, health status, including review of consultants reports, laboratory and other test data, was performed as part of comprehensive evaluation and provision of chronic care management services.   SDOH:  (Social Determinants of Health)  assessments and interventions performed: Yes  SDOH Screenings   Alcohol Screen: Not on file  Depression (PHQ2-9): Low Risk    PHQ-2 Score: 0  Financial Resource Strain: Not on file  Food Insecurity: Not on file  Housing: Not on file  Physical Activity: Not on file  Social Connections: Not on file  Stress: Not on file  Tobacco Use: Medium Risk   Smoking Tobacco Use: Former   Smokeless Tobacco Use: Never  Passive Exposure: Not on file  Transportation Needs: Not on file    [place]  Allergies  Allergen Reactions   Lisinopril     Other reaction(s): Renal impairment   Amlodipine Swelling   Codeine Sulfate Itching and Nausea Only    Medications Reviewed Today     Reviewed by Tomasa Blase, [facility] (Pharmacist) on 08/04/21 at 1024  Med List Status: <None>   Medication Order Taking? Sig Documenting Provider Last Dose Status Informant  acetaminophen (TYLENOL) 500 MG tablet [id number] Yes Take 1,000 mg by mouth every 6 (six) hours as needed for moderate pain. Norins, Heinz Knuckles, MD Taking Active Self  acidophilus (RISAQUAD) CAPS capsule [id number] Yes Take 1 capsule by mouth daily. [provider] Taking Active Self  apixaban (ELIQUIS) 5 MG TABS tablet [id number] Yes Take 5 mg by mouth 2 (two) times daily. [provider] Taking Active   atenolol (TENORMIN) 25 MG tablet [id number] Yes Take 25 mg by mouth daily. [provider] Taking Active Self  atorvastatin (LIPITOR) 80 MG tablet [id number] Yes Take 40 mg by mouth daily. [provider] Taking Active Self  buPROPion (WELLBUTRIN SR) 150 MG 12 hr tablet [id number] Yes Take 150 mg by mouth 2 (two) times daily. [provider] Taking Active Self  finasteride (PROSCAR) 5 MG tablet [id number] Yes Take 5 mg by mouth at bedtime. [provider] Taking Active Self  furosemide (LASIX) 20 MG tablet [id number] Yes Take 2 tablets (40 mg total) by mouth 2 (two) times daily.  Patient taking  differently: Take 40 mg by mouth as needed.   Briscoe Deutscher, DO Taking Active Self  gabapentin (NEURONTIN) 300 MG capsule [id number] Yes Take 300-600 mg by mouth See admin instructions. Take 600 mg by mouth three times daily and  300 mg at night [provider] Taking Active Self  ketoconazole (NIZORAL) 2 % shampoo [id number] Yes Apply 1 application topically every other day. [provider] Taking Active Self  Menthol-Methyl Salicylate (THERA-GESIC PLUS) CREA [id number] Yes Apply 1 application topically at bedtime. Apply to lower back [provider] Taking Active Self  Multiple Vitamins-Minerals (MULTIVITAMINS THER. W/MINERALS) Sheral Flow [id number] Yes Take 1 tablet by mouth daily. [provider] Taking Active Self  nortriptyline (PAMELOR) 10 MG capsule [id number] Yes 2 CAPSULES EVERY NIGHT AT BEDTIME  Patient taking differently: Take 20 mg by mouth at bedtime.   Alda Berthold, DO Taking Active Self  potassium chloride (KLOR-CON) 10 MEQ tablet [id number] Yes Take 4 tablets (40 mEq total) by mouth 2 (two) times daily.  Patient taking differently: Take 20 mEq by mouth daily.   Briscoe Deutscher, DO Taking Active Self  saccharomyces boulardii (FLORASTOR) 250 MG capsule [id number] Yes Take 1 capsule (250 mg total) by mouth 2 (two) times daily.  Patient taking differently: Take 250 mg by mouth daily.   Lavina Hamman, MD Taking Active Self  sildenafil (VIAGRA) 100 MG tablet [id number] Yes Take 100 mg by mouth as needed. [provider] Taking Active   Med List Note Leslye Peer, CPhT 08/21/11 1042): [employer]            Patient Active Problem List   Diagnosis Date Noted   ED (erectile dysfunction) 01/13/2021   Stiffness of left hand joint 01/13/2021   Joint stiffness of hand, right 01/13/2021   Cyst, dermoid, scalp and neck 01/13/2021   Hypertension associated with type 2 diabetes mellitus ([place]) 05/23/2020   Chronic kidney disease (CKD), stage III  (  moderate) ([place]) 12/13/2019   Carpal tunnel syndrome 12/05/2019   Elevated creatine kinase 11/15/2019   Aortic atherosclerosis ([place]) 11/15/2019   Recurrent Clostridium difficile diarrhea 05/28/2017   Peripheral neuropathy 03/17/2017   Diabetes ([place]) 12/11/2016   Allergic rhinitis 12/11/2016   Degenerative arthritis of left knee 01/30/2016   Class 3 severe obesity with serious comorbidity and body mass index (BMI) of 45.0 to 49.9 in adult ([place]) 01/30/2016   History of pulmonary embolus (PE) 12/19/2013   Anxiety state 03/29/2007   Major depressive disorder 03/29/2007   OSA (obstructive sleep apnea) 03/29/2007   Unspecified glaucoma 03/29/2007   BPH (benign prostatic hyperplasia) 03/29/2007   OA (osteoarthritis) of knee 03/29/2007   Essential hypertension 03/29/2007   History of Bell's palsy 03/29/2007    Immunization History  Administered Date(s) Administered   Fluad Quad(high Dose 65+) 02/22/2019, 04/15/2020   H1N1 05/25/2008   Influenza, High Dose Seasonal PF 02/26/2016, 03/17/2017, 02/28/2018   Influenza, Seasonal, Injecte, Preservative Fre 04/02/2009, 04/03/2010, 03/24/2013, 04/06/2014   Influenza,inj,Quad PF,6+ Mos 03/15/2015, 02/25/2021   Influenza-Unspecified 02/16/2008, 03/11/2011, 05/06/2012, 02/14/2015, 03/31/2016, 03/15/2017, 02/22/2019   Moderna Sars-Covid-2 Vaccination 07/31/2019, 08/29/2019, 05/07/2020   Pneumococcal Conjugate-13 09/11/2013, 03/15/2015   Pneumococcal Polysaccharide-23 04/03/2010   Td 04/15/2020   Tdap 11/27/2008   Zoster, Live 02/05/2010    Conditions to be addressed/monitored:  Hypertension, Hyperlipidemia, Diabetes, Coronary Artery Disease, Depression and BPH   Care Plan : [place]  Updates made by Tomasa Blase, RPH since 08/04/2021 12:00 AM     Problem: Hypertension, Hyperlipidemia, Diabetes, Coronary Artery Disease, Depression and BPH   Priority: High     Long-Range Goal: Disease management   Start Date: 10/14/2020   Expected End Date: 08/04/2022  This Visit's Progress: Not on track  Recent Progress: On track  Priority: High  Note:   Current Barriers:  Unable to independently monitor therapeutic efficacy  Pharmacist Clinical Goal(s):  Patient will achieve adherence to monitoring guidelines and medication adherence to achieve therapeutic efficacy through collaboration with PharmD and provider.   Interventions: 1:1 collaboration with Hoyt Koch, MD regarding development and update of comprehensive plan of care as evidenced by provider attestation and co-signature Inter-disciplinary care team collaboration (see longitudinal plan of care) Comprehensive medication review performed; medication list updated in electronic medical record  Hypertension / CKD 3a  Uncontrolled  BP goal is:  <130/80 Patient checks BP at home when feeling symptomatic Patient home BP readings are ranging: 120s-130s/60s BP Readings from Last 3 Encounters:  07/10/21 (!) 157/69  01/13/21 132/80  01/01/21 (!) 124/56  Patient has failed these meds in the past: HCTZ Patient is currently controlled on the following medications:  Atenolol 25 mg - 1 tab daily AM  Furosemide 20 mg - 2 tab daily (pt takes less than prescribed) - taking 3-4 times weekly  [place]lor-Con 10 mEq - 2 tab daily - takes 2 tabs with each furosemide dose he takes    We discussed: reducing sodium and caffeine in diet, also stressed adherence to furosemide to help keep BP lower as well.  Recommended for patient to start hydralazine 10mg  BID - [facility] to discuss BP (last visit increased atenolol - patient reports no change in BP since increase) - possible patient could benefit from switch to coreg for improved BP lowering - has been on lisinopril in the past (noted renal impairment intolerance - amlodipine caused swelling)   Plan -Start hydralazine 10mg  BID    Hyperlipidemia /  CAD  Controlled  LDL goal < 70 Hx CAD   Lab  Results  Component Value Date   LDLCALC 43 04/15/2020   Patient has failed these meds in past: n/a Patient is currently controlled on the following medications:  Atorvastatin 80 mg - 1/2 tab daily HS   We discussed:  diet and exercise extensively; Cholesterol goals; benefits of statin for ASCVD risk reduction; cholesterol has improved since pt has lost >30 lbs   Plan: Continue current medications and control with diet and exercise   Diabetes    A1c goal <7% Lab Results  Component Value Date   HGBA1C 5.6 01/13/2021  Checking BG: Never    Patient has failed these meds in past: Trulicity (cost), metformin, ozempic (cost) Patient is currently controlled on the following medications: Diet controlled at this time    We discussed: no changes to current DM plan at this time    Plan: Continue current medications and control with diet and exercise   Depression  Controlled  Patient has failed these meds in past: n/a Patient is currently controlled on the following medications:  Bupropion SR 150 mg BID Nortriptyline 10 mg - 2 cap HS   We discussed: Patient is satisfied with current regimen and denies issues; he reports nortriptyline is not for mood  Plan: Continue current medications   Pain    Peripheral neuropathy Degenerative osteoarthritis of knee   Patient has failed these meds in past: tramadol Patient is currently controlled on the following medications:  Gabapentin 300 mg - 2 AM, 2 lunch, 2 PM, 1 HS Nortriptyline 10 mg - 2 cap HS Tylenol 500 mg PRN   We discussed:  Pt reports taking nortriptyline for neuropathy (not mood); Patient is satisfied with current regimen and denies issues   Plan: Continue current medications   Hx PE    Patient has failed these meds in past: n/a Patient is currently controlled on the following medications:  Eliquis 5 mg BID   We discussed: lifelong indication for Eliquis; bleeding risk; pt endorses occasional bruising and nuisance  bleeding; denies major bleeding -Possible after knee surgery patient will reduce back to 2.$RemoveB'5mg'sieGOIjj$  BID per Dr. Marin Olp - no date set for knee surgery at this time    Plan: Continue current medications   BPH   Controlled  Patient has failed these meds in past: tamsulosin Patient is currently controlled on the following medications:  Finasteride 5 mg HS   We discussed:  Pt stable at this time, no changes to medications    Plan Continue current medications  Pt to discuss therapy options with [facility] provider  Patient Goals/Self-Care Activities Patient will:  - take medications as prescribed focus on medication adherence by pill box check blood pressure daily, document, and provide at future appointments -Discuss optimizing blood pressure medications for kidney disease with [facility] provider  Follow Up Plan: Telephone follow up appointment with care management team member scheduled for: 2-3 months       Medication Assistance: None required.  Patient affirms current coverage meets needs.  Patient's preferred pharmacy is:  [id number] - [place], [address] [place] [address] Phone: [phone] Fax: [phone]  Uses pill box? Yes Pt endorses 100% compliance  We discussed: Current pharmacy is preferred with insurance plan and patient is satisfied with pharmacy services Patient decided to: Continue current medication management strategy  Care Plan and Follow Up Patient Decision:  Patient agrees to Care Plan and Follow-up.  Plan: Telephone follow up appointment with care management team member scheduled for:  6 months  Tomasa Blase, PharmD Clinical Pharmacist, [place]

## 2021-08-04 NOTE — Patient Instructions (Signed)
Visit Information  Following are the goals we discussed today:   Manage My Medicine   Timeframe:  Long-Range Goal Priority:  Medium Start Date:       10/14/20                      Expected End Date: 08/04/2022                     Follow Up Date 09/30/2021   - call for medicine refill 2 or 3 days before it runs out - call if I am sick and can't take my medicine - keep a list of all the medicines I take; vitamins and herbals too - use a pillbox to sort medicine  -Discuss optimizing blood pressure medications for kidney disease with [facility] provider   Why is this important?   These steps will help you keep on track with your medicines.  Plan: Telephone follow up appointment with care management team member scheduled for:  2 months  The patient has been provided with contact information for the care management team and has been advised to call with any health related questions or concerns.   Tomasa Blase, PharmD Clinical Pharmacist, [facility]   Please call the care guide team at [phone] if you need to cancel or reschedule your appointment.   Patient verbalizes understanding of instructions and care plan provided today and agrees to view in [place]. Active MyChart status confirmed with patient.

## 2021-08-12 DIAGNOSIS — I10 Essential (primary) hypertension: Secondary | ICD-10-CM

## 2021-08-12 DIAGNOSIS — I2510 Atherosclerotic heart disease of native coronary artery without angina pectoris: Secondary | ICD-10-CM

## 2021-08-12 DIAGNOSIS — N1831 Chronic kidney disease, stage 3a: Secondary | ICD-10-CM

## 2021-08-12 DIAGNOSIS — E1169 Type 2 diabetes mellitus with other specified complication: Secondary | ICD-10-CM

## 2021-08-13 ENCOUNTER — Other Ambulatory Visit (INDEPENDENT_AMBULATORY_CARE_PROVIDER_SITE_OTHER): Payer: Self-pay | Admitting: Medical

## 2021-08-13 MED ORDER — HYDRALAZINE 10 MG TABLET
10.0000 mg | ORAL_TABLET | Freq: Three times a day (TID) | ORAL | 2 refills | Status: DC
Start: 2021-08-13 — End: 2021-10-15

## 2021-08-14 DIAGNOSIS — T8484XA Pain due to internal orthopedic prosthetic devices, implants and grafts, initial encounter: Secondary | ICD-10-CM | POA: Diagnosis not present

## 2021-08-14 DIAGNOSIS — Z9889 Other specified postprocedural states: Secondary | ICD-10-CM | POA: Diagnosis not present

## 2021-08-14 DIAGNOSIS — M1711 Unilateral primary osteoarthritis, right knee: Secondary | ICD-10-CM | POA: Diagnosis not present

## 2021-08-18 ENCOUNTER — Telehealth: Payer: Self-pay | Admitting: Internal Medicine

## 2021-08-18 NOTE — Telephone Encounter (Signed)
Left message for patient to call back to schedule Medicare Annual Wellness Visit  ? ?No hx of AWV eligible as of 10/14/10 ? ?Please schedule at anytime with [facility] if patient calls the office back.   ? ?40 Minutes appointment  ? ?Any questions, please call me at [phone]  ?

## 2021-09-01 ENCOUNTER — Ambulatory Visit (HOSPITAL_COMMUNITY): Payer: Self-pay

## 2021-09-02 ENCOUNTER — Other Ambulatory Visit (INDEPENDENT_AMBULATORY_CARE_PROVIDER_SITE_OTHER): Payer: Self-pay | Admitting: Urology

## 2021-09-02 MED ORDER — TAMSULOSIN 0.4 MG CAPSULE
0.4000 mg | ORAL_CAPSULE | Freq: Every evening | ORAL | 5 refills | Status: DC
Start: 2021-09-02 — End: 2021-09-30

## 2021-09-11 DIAGNOSIS — M1711 Unilateral primary osteoarthritis, right knee: Secondary | ICD-10-CM | POA: Diagnosis not present

## 2021-09-30 ENCOUNTER — Other Ambulatory Visit: Payer: Self-pay

## 2021-09-30 ENCOUNTER — Ambulatory Visit (INDEPENDENT_AMBULATORY_CARE_PROVIDER_SITE_OTHER): Payer: No Typology Code available for payment source | Admitting: Family Medicine

## 2021-09-30 ENCOUNTER — Encounter (INDEPENDENT_AMBULATORY_CARE_PROVIDER_SITE_OTHER): Payer: Self-pay | Admitting: Family Medicine

## 2021-09-30 VITALS — BP 142/80 | HR 50 | Ht 71.0 in | Wt 269.0 lb

## 2021-09-30 DIAGNOSIS — E782 Mixed hyperlipidemia: Secondary | ICD-10-CM

## 2021-09-30 DIAGNOSIS — M545 Low back pain, unspecified: Secondary | ICD-10-CM

## 2021-09-30 DIAGNOSIS — F17220 Nicotine dependence, chewing tobacco, uncomplicated: Secondary | ICD-10-CM

## 2021-09-30 DIAGNOSIS — J449 Chronic obstructive pulmonary disease, unspecified: Secondary | ICD-10-CM

## 2021-09-30 DIAGNOSIS — G8929 Other chronic pain: Secondary | ICD-10-CM

## 2021-09-30 DIAGNOSIS — M542 Cervicalgia: Secondary | ICD-10-CM

## 2021-09-30 DIAGNOSIS — Z9289 Personal history of other medical treatment: Secondary | ICD-10-CM

## 2021-09-30 DIAGNOSIS — I10 Essential (primary) hypertension: Secondary | ICD-10-CM

## 2021-09-30 DIAGNOSIS — G4733 Obstructive sleep apnea (adult) (pediatric): Secondary | ICD-10-CM

## 2021-09-30 DIAGNOSIS — I4892 Unspecified atrial flutter: Secondary | ICD-10-CM

## 2021-09-30 MED ORDER — LISINOPRIL 40 MG TABLET
40.0000 mg | ORAL_TABLET | Freq: Every day | ORAL | 3 refills | Status: DC
Start: 2021-09-30 — End: 2022-04-01

## 2021-09-30 NOTE — Nursing Note (Signed)
09/30/21 1044   Depression Screen   Little interest or pleasure in doing things. 0   Feeling down, depressed, or hopeless 0   PHQ 2 Total 0

## 2021-09-30 NOTE — Progress Notes (Signed)
Thomas [name] is a 76 year old male who presents today for a six-month follow-up of hypertension and hyperlipidemia and chronic medical problems.  The patient reports current adherence to recommended diet is good. The pt is compliant with medicine.  C/o neck pain and low back pain.  This is a chronic issue for him.  He has had testing done in the past.  He states he used to get her chiropractor company doing that recently.  He states the pain is in the back of his neck on both sides and it hurts if he moves his head side to side.  He is not having radiation down his arms.  No numbness or weakness of his hands.  The pain is in the lower back area left of midline.  It is not radiating into the buttock or down the leg.  No weakness or numbness of his legs or feet.  No bowel or bladder incontinence.  Regarding HTN: The following symptoms are also present: none, and he also denies the following symptoms: headache, blurred vision, chest discomfort, shortness of breath and palpitations.  Side effects of medication:none.    Regarding lipid treatment: He is taking Zocor and is having no side effects.    Lab Results   Component Value Date    CHOLESTEROL 126 01/24/2021    HDLCHOL 49 01/24/2021    LDLCHOL 62 01/24/2021    TRIG 77 01/24/2021      He does have a history of atrial flutter and has follow-up with cardiology. He continues on his Eliquis as his anticoagulant and a calcium channel blocker for rate control. He denies having any bleeding.  He denies any palpitations, lightheadedness, dizziness or chest pain.   He does have a history of COPD.  He follows with pulmonology.  He has been through several different inhalers but has had side effects to some of them and stops them on his own.  He does have an appointment coming up with pulmonology in a couple.  He states he is having some difficulty breathing because he is not currently on an inhaler for this.  Nonsmoker.   Most recent Cologuard 10/31/2020, negative    REVIEW OF  SYSTEMS:  GENERAL: negative for fevers/chills, fatigue, significant weight change, sleep disturbance  HEENT:denies visual changes, sore throat or URI symptoms  RESPIRATORY:  Positive for shortness of breath as above, no cough, or wheeze  CARDIAC:  Negative for chest pain or palpitations  GI: no nausea/vomitting/diarrhea, no abdominal pain  MUSCULOSKELETAL: no myalgias or arthragias, no recent trauma, neck and low back pain as above in history of present illness  NEUROLOGIC: no headache, visual changes or mental status changes  GU: no urinary symptoms or CVA tenderness      Allergies   Allergen Reactions   . Flomax [Tamsulosin] Nausea/ Vomiting     Current Outpatient Medications   Medication Sig   . acetaminophen (TYLENOL) 500 mg Oral Tablet Take 1 Tablet (500 mg total) by mouth Every 4 hours as needed for Pain (pt takes tylenol pm)   . albuterol sulfate (PROVENTIL) 2.5 mg /3 mL (0.083 %) Inhalation nebulizer solution    . apixaban (ELIQUIS) 5 mg Oral Tablet Take 1 Tablet (5 mg total) by mouth   . dilTIAZem (CARDIZEM CD) 240 mg Oral Capsule, Sust. Release 24 hr TAKE ONE CAPSULE BY MOUTH EVERY DAY   . flecainide (TAMBOCOR) 100 mg Oral Tablet TAKE ONE TABLET BY MOUTH TWICE DAILY   . furosemide (LASIX) 40 mg Oral Tablet  TAKE ONE TABLET BY MOUTH EVERY DAY   . hydrALAZINE (APRESOLINE) 10 mg Oral Tablet Take 1 Tablet (10 mg total) by mouth Three times a day   . lisinopriL (PRINIVIL) 40 mg Oral Tablet Take 1 Tablet (40 mg total) by mouth Once a day   . nitroGLYCERIN (NITROSTAT) 0.4 mg Sublingual Tablet, Sublingual Place 1 Tablet (0.4 mg total) under the tongue Every 5 minutes as needed for Chest pain   . potassium chloride (KLOR-CON) 10 mEq Oral Tablet Sustained Release TAKE ONE TABLET BY MOUTH EVERY DAY   . simvastatin (ZOCOR) 20 mg Oral Tablet Take 1 Tablet (20 mg total) by mouth Every night   . tamsulosin (FLOMAX) 0.4 mg Oral Capsule Take 1 Capsule (0.4 mg total) by mouth Every evening after dinner     Past Medical  History:   Diagnosis Date   . Arthritis    . Dysuria    . Enteritis due to Rotavirus    . Gout    . Headache    . Hemorrhoid    . Hypercholesterolemia    . Hypertension          Past Surgical History:   Procedure Laterality Date   . HX HEMORRHOIDECTOMY           Social History     Tobacco Use   . Smoking status: Former     Types: Cigars   . Smokeless tobacco: Current     Types: Chew, Snuff   Substance Use Topics   . Alcohol use: Yes     Comment: occasional        PHYSICAL EXAM:   The patient appears to be in no acute distress.  Vitals: BP (!) 142/80   Pulse 50   Ht 1.803 m ( )   Wt 122 kg (269 lb)   SpO2 96%   BMI 37.52 kg/m    HEENT:  He does open and packed right, on the left he does have some cerumen, however is not completely blocking the external auditory canal.  Respiratory: clear to ascultation B/L, no respiratory distress, equal BS throughout, no bibasilar rales  Heart: normal sinus rhythm today, no murmur, no edema, normal peripheral pulses  Abdomen: soft, nontender, positive bowel sounds  Extremities: no edema, no calf pain or tenderness  Neuro: AAOx3, cranial nerves's intact, DTR's 2/4 throughout  Skin: warm and dry, no rashes or lesions    ASSESSMENT:     ICD-10-CM    1. Paroxysmal atrial flutter (CMS HCC)  I48.92 Continue anticoagulant and rate controlling medications and follow-up with cardiology.      2. Essential hypertension  I10 Blood pressure well controlled here.  He does have some numbers at home that have been higher but he tends to take it at home when he is doing something stress rather than at rest.  I have asked him to continue monitoring at home if he does have systolic blood pressures greater than 150 consistently then he needs his blood pressure medication increased any voices understanding      3. Mixed hyperlipidemia  E78.2 Continues statin therapy      4. Chronic obstructive pulmonary disease, unspecified COPD type (CMS HCC)  J44.9 He has been having trouble finding a  medication that he is able to tolerate.  Follow-up with pulmonology next month      5. History of cardioversion  Z92.89       6. Chronic left-sided low back pain without sciatica  M54.50 I recommended plain  film to physical therapy but he refuses at this time    G89.29       7. Chronic neck pain  M54.2 I recommended plain films and referral to physical therapy but he refuses at this time.  He will let us know if he reconsiders.    G89.29       8. OSA (obstructive sleep apnea)  G47.33 He was sent for sleep apnea testing but refused to be said he refuses to use a CPAP machine.        PLAN:   I reviewed appropriate screenings and HCM with patient.    No orders of the defined types were placed in this encounter.    Medication: continue current medication regimen unchanged  No follow-ups on file.

## 2021-09-30 NOTE — Nursing Note (Signed)
Follow up chronic medical conditions.

## 2021-09-30 NOTE — Nursing Note (Signed)
09/30/21 1000   Transportation   Has lack of transportation kept you from medical appointments, meetings, work, or from getting things needed for daily living?  No

## 2021-09-30 NOTE — Nursing Note (Signed)
09/30/21 1044   Fall Risk Assessment   Do you feel unsteady when standing or walking? Yes   Do you worry about falling? No   Have you fallen in the past year? No   Timed up and go test (in seconds) 12

## 2021-10-01 ENCOUNTER — Ambulatory Visit: Payer: Medicare HMO

## 2021-10-01 DIAGNOSIS — I10 Essential (primary) hypertension: Secondary | ICD-10-CM

## 2021-10-01 DIAGNOSIS — Z86711 Personal history of pulmonary embolism: Secondary | ICD-10-CM

## 2021-10-01 DIAGNOSIS — I2510 Atherosclerotic heart disease of native coronary artery without angina pectoris: Secondary | ICD-10-CM

## 2021-10-01 DIAGNOSIS — N400 Enlarged prostate without lower urinary tract symptoms: Secondary | ICD-10-CM

## 2021-10-01 NOTE — Patient Instructions (Signed)
Visit Information ? ?Following are the goals we discussed today:  ? ?Manage My Medicine  ? ?Timeframe:  Long-Range Goal ?Priority:  Medium ?Start Date:       10/14/20                      ?Expected End Date: 08/04/2022                    ? ?Follow Up Date 03/2022 ?  ?- call for medicine refill 2 or 3 days before it runs out ?- call if I am sick and can't take my medicine ?- keep a list of all the medicines I take; vitamins and herbals too ?- use a pillbox to sort medicine  ?  ?Why is this important?   ?These steps will help you keep on track with your medicines. ? ?Plan: Telephone follow up appointment with care management team member scheduled for:  6 months  ?The patient has been provided with contact information for the care management team and has been advised to call with any health related questions or concerns.  ? ?Tanner [name], PharmD ?Clinical Pharmacist, [place]  ? ?Please call the care guide team at [phone] if you need to cancel or reschedule your appointment.  ? ?Patient verbalizes understanding of instructions and care plan provided today and agrees to view in [place]. Active MyChart status confirmed with patient.   ? ?

## 2021-10-01 NOTE — Progress Notes (Signed)
? ?Chronic Care Management ?Pharmacy Note ? ?Summary: ?-Patient reports that he was seen by [facility] provider earlier this month, was given new BP cuff and clobetasol 0.05% ointment  ?-Notes that since receiving new BP cuff BP has been lower - only has had for 2 days, BP controlled at 138/79 and 133/72 ?-Patient endorses compliance with current medications, denies any issues or concerns  ? ?Recommendations/Changes made from today's visit: ?-Recommended for patient to continue monitoring BP at home daily, to reach out to office / [place] office should BP average >140/90 ? ?Plan: ?-F/u in 6 months  ? ? ?10/01/2021 ?Name:  PHILOPATER MUCHA MRN:  [id number] DOB:  14-Nov-1945 ? ?Subjective: ?IVOR KISHI is an 76 y.o. year old male who is a primary patient of Hoyt Koch, MD.  The CCM team was consulted for assistance with disease management and care coordination needs.   ? ?Engaged with patient by telephone for follow up visit in response to provider referral for pharmacy case management and/or care coordination services.  ? ?Consent to Services:  ?The patient was given information about Chronic Care Management services, agreed to services, and gave verbal consent prior to initiation of services.  Please see initial visit note for detailed documentation.  ? ?Patient Care Team: ?Hoyt Koch, MD as PCP - General (Internal Medicine) ?Tomasa Blase, [facility] (Pharmacist) ? ?Recent office visits: ?01/13/2021 - [name] - hold atenolol, continue hold of DM medications - A1c at goal - follow up in 6 months  ? ?Recent consult visits: ?09/15/2021 - Dr[facility] Nicki [facility] - no changes to medications  ? ?Hospital visits: ?None in previous 6 months  ? ?Objective: ? ?Lab Results  ?Component Value Date  ? CREATININE 1.44 (H) 07/10/2021  ? BUN 19 07/10/2021  ? GFR 48.19 (L) 12/12/2019  ? GFRNONAA 50 (L) 07/10/2021  ? GFRAA 55 (L) 01/23/2020  ? NA 141 07/10/2021  ? K 4.0 07/10/2021  ? CALCIUM 9.5 07/10/2021  ? CO2 30 07/10/2021  ?  GLUCOSE 102 (H) 07/10/2021  ? ? ?Lab Results  ?Component Value Date/Time  ? HGBA1C 5.6 01/13/2021 09:23 AM  ? HGBA1C 5.1 08/20/2020 01:26 PM  ? HGBA1C 5.4 04/15/2020 12:00 AM  ? HGBA1C 8.1 (H) 10/12/2019 02:59 PM  ? GFR 48.19 (L) 12/12/2019 02:43 PM  ? GFR 63.06 11/29/2017 01:32 PM  ?  ?Last diabetic Eye exam: No results found for: HMDIABEYEEXA  ?Last diabetic Foot exam: No results found for: HMDIABFOOTEX  ? ?Lab Results  ?Component Value Date  ? CHOL 85 04/15/2020  ? HDL 33 (A) 04/15/2020  ? [place] 43 04/15/2020  ? LDLDIRECT 65.9 09/13/2007  ? TRIG 122 04/15/2020  ? CHOLHDL 3.6 10/12/2019  ? ? ? ?  Latest Ref Rng & Units 07/10/2021  ? 10:05 AM 01/01/2021  ? 11:43 AM 07/04/2020  ? 11:35 AM  ?Hepatic Function  ?Total Protein 6.5 - 8.1 g/dL 6.4   6.2   6.5    ?Albumin 3.5 - 5.0 g/dL 4.2   3.9   4.0    ?AST 15 - 41 U/L _0 ?ALT 0 - 44 U/L _1 ?Alk Phosphatase 38 - 126 U/L 102   95   91    ?Total Bilirubin 0.3 - 1.2 mg/dL 0.8   0.7   0.5    ? ? ?Lab Results  ?Component Value Date/Time  ? TSH 6.45 (  H) 01/13/2021 09:22 AM  ? TSH 3.050 10/12/2019 02:59 PM  ? FREET4 1.35 10/12/2019 02:59 PM  ? ? ? ?  Latest Ref Rng & Units 07/10/2021  ? 10:05 AM 01/01/2021  ? 11:43 AM 08/20/2020  ?  1:26 PM  ?CBC  ?WBC 4.0 - 10.5 K/uL 9.3   7.4   8.8    ?Hemoglobin 13.0 - 17.0 g/dL 14.7   13.2   15.5    ?Hematocrit 39.0 - 52.0 % 46.1   42.2   48.8    ?Platelets 150 - 400 K/uL 181   174   174    ? ? ?Lab Results  ?Component Value Date/Time  ? VD25OH 45.21 01/13/2021 09:22 AM  ? VD25OH 51 04/15/2020 12:00 AM  ? VD25OH 32.6 10/12/2019 02:59 PM  ? VD25OH 29.47 (L) 11/29/2017 01:32 PM  ? ? ?Clinical ASCVD: Yes  ?The ASCVD Risk score (Arnett DK, et al., 2019) failed to calculate for the following reasons: ?  The valid total cholesterol range is 130 to 320 mg/dL   ? ? ?  01/13/2021  ?  8:56 AM 10/12/2019  ? 10:52 AM 09/24/2016  ? 11:38 AM  ?Depression screen PHQ 2/9  ?Decreased Interest 0 3 0  ?Down, Depressed, Hopeless 0 3 0  ?PHQ -  2 Score 0 6 0  ?Altered sleeping 0 0   ?Tired, decreased energy 0 3   ?Change in appetite 0 1   ?Feeling bad or failure about yourself  0 1   ?Trouble concentrating 0 1   ?Moving slowly or fidgety/restless 0 0   ?Suicidal thoughts 0 0   ?PHQ-9 Score 0 12   ?Difficult doing work/chores  Not difficult at all   ?  ? ?Social History  ? ?Tobacco Use  ?Smoking Status Former  ? Packs/day: 2.00  ? Years: 33.00  ? Pack years: 66.00  ? Types: Cigarettes  ? Start date: 08/30/1968  ? Quit date: 07/02/2001  ? Years since quitting: 20.2  ?Smokeless Tobacco Never  ?Tobacco Comments  ? quit 12 years ago  ? ?BP Readings from Last 3 Encounters:  ?07/10/21 (!) 157/69  ?01/13/21 132/80  ?01/01/21 (!) 124/56  ? ?Pulse Readings from Last 3 Encounters:  ?07/10/21 71  ?01/13/21 77  ?01/01/21 70  ? ?Wt Readings from Last 3 Encounters:  ?07/10/21 268 lb 0.6 oz (121.6 kg)  ?01/13/21 264 lb 9.6 oz (120 kg)  ?01/01/21 262 lb (118.8 kg)  ? ?BMI Readings from Last 3 Encounters:  ?07/10/21 43.93 kg/m?  ?01/13/21 43.36 kg/m?  ?01/01/21 42.94 kg/m?  ? ? ?Assessment/Interventions: Review of patient past medical history, allergies, medications, health status, including review of consultants reports, laboratory and other test data, was performed as part of comprehensive evaluation and provision of chronic care management services.  ? ?SDOH:  (Social Determinants of Health) assessments and interventions performed: Yes ? ?SDOH Screenings  ? ?Alcohol Screen: Not on file  ?Depression (PHQ2-9): Low Risk   ? PHQ-2 Score: 0  ?Financial Resource Strain: Not on file  ?Food Insecurity: Not on file  ?Housing: Not on file  ?Physical Activity: Not on file  ?Social Connections: Not on file  ?Stress: Not on file  ?Tobacco Use: Medium Risk  ? Smoking Tobacco Use: Former  ? Smokeless Tobacco Use: Never  ? Passive Exposure: Not on file  ?Transportation Needs: Not on file  ? ? ?[place] ? ?Allergies  ?Allergen Reactions  ? Lisinopril   ?  Other reaction(s): Renal  impairment  ? Amlodipine Swelling  ? Codeine Sulfate Itching and Nausea Only  ? ? ?Medications Reviewed Today   ? ? Reviewed by Tomasa Blase, [facility] (Pharmacist) on 10/01/21 at 1105  Med List Status: <None>  ? ?Medication Order Taking? Sig Documenting Provider Last Dose Status Informant  ?acetaminophen (TYLENOL) 500 MG tablet [id number] Yes Take 1,000 mg by mouth every 6 (six) hours as needed for moderate pain. Norins, Heinz Knuckles, MD Taking Active Self  ?acidophilus (RISAQUAD) CAPS capsule [id number] Yes Take 1 capsule by mouth daily. [provider] Taking Active Self  ?apixaban (ELIQUIS) 5 MG TABS tablet [id number] Yes Take 5 mg by mouth 2 (two) times daily. [provider] Taking Active   ?atenolol (TENORMIN) 25 MG tablet [id number] Yes Take 25 mg by mouth daily. [provider] Taking Active Self  ?atorvastatin (LIPITOR) 80 MG tablet [id number] Yes Take 40 mg by mouth daily. [provider] Taking Active Self  ?buPROPion (WELLBUTRIN SR) 150 MG 12 hr tablet [id number] Yes Take 150 mg by mouth 2 (two) times daily. [provider] Taking Active Self  ?clobetasol ointment (TEMOVATE) 0.05 % [id number] Yes APPLY SMALL AMOUNT TOPICALLY EVERY DAY AS NEEDED DO NOT USE ON FACE OR GENITALS [provider] Taking Active   ?finasteride (PROSCAR) 5 MG tablet [id number] Yes Take 5 mg by mouth at bedtime. [provider] Taking Active Self  ?furosemide (LASIX) 20 MG tablet [id number] Yes Take 2 tablets (40 mg total) by mouth 2 (two) times daily.  ?Patient taking differently: Take 20-40 mg by mouth daily.  ? Briscoe Deutscher, DO Taking Active Self  ?gabapentin (NEURONTIN) 300 MG capsule [id number] Yes Take 300-600 mg by mouth See admin instructions. Take 600 mg by mouth three times daily and  300 mg at night [provider] Taking Active Self  ?ketoconazole (NIZORAL) 2 % shampoo [id number] Yes Apply 1 application topically every other day. [provider] Taking Active  Self  ?Menthol-Methyl Salicylate (THERA-GESIC PLUS) CREA [id number] Yes Apply 1 application topically at bedtime. Apply to lower back [provider] Taking Active Self  ?Multiple Vitamins-[place]

## 2021-10-15 ENCOUNTER — Ambulatory Visit: Payer: No Typology Code available for payment source | Attending: PHYSICIAN ASSISTANT | Admitting: PHYSICIAN ASSISTANT

## 2021-10-15 ENCOUNTER — Other Ambulatory Visit: Payer: Self-pay

## 2021-10-15 ENCOUNTER — Encounter (INDEPENDENT_AMBULATORY_CARE_PROVIDER_SITE_OTHER): Payer: Self-pay | Admitting: PHYSICIAN ASSISTANT

## 2021-10-15 VITALS — BP 138/80 | HR 76 | Ht 71.0 in | Wt 260.0 lb

## 2021-10-15 DIAGNOSIS — M79606 Pain in leg, unspecified: Secondary | ICD-10-CM | POA: Insufficient documentation

## 2021-10-15 DIAGNOSIS — I10 Essential (primary) hypertension: Secondary | ICD-10-CM

## 2021-10-15 DIAGNOSIS — I4460 Unspecified fascicular block: Secondary | ICD-10-CM | POA: Insufficient documentation

## 2021-10-15 DIAGNOSIS — I4892 Unspecified atrial flutter: Secondary | ICD-10-CM | POA: Insufficient documentation

## 2021-10-15 DIAGNOSIS — R0609 Other forms of dyspnea: Secondary | ICD-10-CM

## 2021-10-15 DIAGNOSIS — R609 Edema, unspecified: Secondary | ICD-10-CM | POA: Insufficient documentation

## 2021-10-15 DIAGNOSIS — E782 Mixed hyperlipidemia: Secondary | ICD-10-CM | POA: Insufficient documentation

## 2021-10-15 LAB — ECG W INTERP (AMB USE ONLY)(MUSE,IN CLINIC)
Atrial Rate: 45 {beats}/min
Calculated P Axis: 40 degrees
Calculated R Axis: -76 degrees
Calculated T Axis: 15 degrees
PR Interval: 218 ms
QRS Duration: 114 ms
QT Interval: 492 ms
QTC Calculation: 425 ms
Ventricular rate: 45 {beats}/min

## 2021-10-15 MED ORDER — HYDRALAZINE 25 MG TABLET
25.0000 mg | ORAL_TABLET | Freq: Three times a day (TID) | ORAL | 3 refills | Status: DC
Start: 2021-10-15 — End: 2022-01-15

## 2021-10-15 NOTE — Progress Notes (Signed)
Thomas [name] is a 76 y.o. male seen in the office on 10/15/2021    Chief Complaint   Patient presents with   . Follow Up 6 Months     Atrial flutter, LT leg edema, and Shortness of breath          Patient Active Problem List   Diagnosis   . Atrial flutter (CMS HCC)   . Body mass index (BMI) of 33.0 to 33.9 in adult   . Chest tightness   . Essential hypertension   . Hyperlipidemia   . Left bundle branch hemiblock   . Paroxysmal atrial flutter (CMS HCC)   . History of cardioversion   . History of cardiac cath-Overall normal coronary anatomy 12/26/2018   . Cardiac left ventricular ejection fraction 55-60% on TEE 12/26/18       History of Present Illness: This is 76 year old male patient with a past medical history of paroxysmal atrial flutter/fib, hypertension, hyperlipidemia, gout, and chronic shortness of breath-patient is to see pulmonology in this regard. He was seen one time byDr. Kristen Cardinal on05/08/2019secondary to atrial flutter.Holter monitor, echocardiogram, and stress test werecompleted in the past. Patient was seen at our office to establish care.  He was complaining of chest pain that radiated into his left arm and palpitations.  Nuclear stress test and Holter monitor was ordered.  Patient underwent nuclear stress test which returned abnormal.  Holter monitor was never completed.  Cardiac catheterization was completed 12/26/2018 which revealed overall normal coronary anatomy with an estimated ejection fraction of 55%.  Dr. Francie Massing recommended TEE/DC cardioversion as he was in atrial fibrillation/atrial flutter.  TEE was completed revealing an EF of 55-60%.  No evidence of thrombus was identified.  The patient was successfully cardioverted to normal sinus rhythm and initiated on flecainide.  When the patient presented for follow-up following TEE/DC cardioversion he was back in atrial fibrillation.  The patient wished to continue with rate control and anticoagulation.  His flecainide was discontinued.   Since then, he did convert back to normal sinus and his flecainide was re-initiated.  He has continued to go in and out of atrial fibrillation/flutter since then.    On recent office visit, patient had a complaint of "heart flutters".  He was ordered a 7 day event monitor.  This was completing showing rare ventricular ectopic beats with no significant complex ectopy.  No cardiac arrhythmias we were identified.  Minimal heart rate was 40 beats per minute with maximum heart rate of 82 beats per minute and average heart rate 53.     Presents to the office today for a three-month short-term follow-up after medication adjustment for hypertension.  At his last office visit his hydralazine was increased from 10 mg to 25 mg t.i.d. however the patient is currently now only taking 10 mg t.i.d. blood pressure today in the office is 138/80.  The patient has me that the increased blood pressure medicine was never sent into the pharmacy after his last visit.  He has been watching his blood pressure at home and states that his blood pressure systolic is typically is in the 140-150 range.  The patient also notices that he has been having some swelling to his lower extremity.  His left leg is significantly more swollen than the right.  He states that this does tend to improve in the evening hours and is better upon awakening.  He does wear compression socks but it does not seem to help.  He also endorses that he  has been having increasing shortness of breath on exertion.  He tells me this is been ongoing for a couple of months.  He had a visit with his pulmonologist and they stopped his Anoro and changed to Spiriva.  Patient states that he takes the Spiriva as a rescue medication and does not take it on a routine basis his shortness of breath began shortly after this medication adjustment.  He has not conveyed to his pulmonologist that he is not taking the Spiriva as prescribed.  He denies any episodes of chest pain or discomfort.   He continues to use smokeless tobacco.  Also continues to drink beer frequently and is asking for information on consequences of tobacco and alcohol use in regards to his heart.    Current Outpatient Medications   Medication Sig   . acetaminophen (TYLENOL) 500 mg Oral Tablet Take 1 Tablet (500 mg total) by mouth Every 4 hours as needed for Pain (pt takes tylenol pm)   . apixaban (ELIQUIS) 5 mg Oral Tablet Take 1 Tablet (5 mg total) by mouth   . dilTIAZem (CARDIZEM CD) 240 mg Oral Capsule, Sust. Release 24 hr TAKE ONE CAPSULE BY MOUTH EVERY DAY   . flecainide (TAMBOCOR) 100 mg Oral Tablet TAKE ONE TABLET BY MOUTH TWICE DAILY   . furosemide (LASIX) 40 mg Oral Tablet TAKE ONE TABLET BY MOUTH EVERY DAY   . hydrALAZINE (APRESOLINE) 25 mg Oral Tablet Take 1 Tablet (25 mg total) by mouth Three times a day   . lisinopriL (PRINIVIL) 40 mg Oral Tablet Take 1 Tablet (40 mg total) by mouth Once a day   . nitroGLYCERIN (NITROSTAT) 0.4 mg Sublingual Tablet, Sublingual Place 1 Tablet (0.4 mg total) under the tongue Every 5 minutes as needed for Chest pain   . potassium chloride (KLOR-CON) 10 mEq Oral Tablet Sustained Release TAKE ONE TABLET BY MOUTH EVERY DAY   . simvastatin (ZOCOR) 20 mg Oral Tablet Take 1 Tablet (20 mg total) by mouth Every night       Allergies   Allergen Reactions   . Flomax [Tamsulosin] Nausea/ Vomiting     Family Medical History:     Problem Relation (Age of Onset)    Cancer Mother    Hypertension (High Blood Pressure) Mother, Father          Social History     Socioeconomic History   . Marital status: Widowed   Tobacco Use   . Smoking status: Former     Types: Cigars   . Smokeless tobacco: Current     Types: Chew, Snuff   Vaping Use   . Vaping Use: Never used   Substance and Sexual Activity   . Alcohol use: Yes     Comment: occasional   . Drug use: Never   . Sexual activity: Not Currently     Social Determinants of Health     Transportation Needs: Low Risk    . SDOH Transportation: No         Physical  Exam:  BP 138/80   Pulse 76   Ht 1.803 m (5\' 11" )   Wt 118 kg (260 lb)   SpO2 93%   BMI 36.26 kg/m       Body mass index is 36.26 kg/m.  Wt Readings from Last 5 Encounters:   10/15/21 118 kg (260 lb)   09/30/21 122 kg (269 lb)   07/30/21 118 kg (260 lb)   07/18/21 121 kg (266 lb)   06/23/21 120  kg (265 lb)       General:  Well-appearing elderly male sitting on exam table, no apparent distress, wife at bedside Awake alert oriented  Neck: Strong Carotid pulse, no JVD, No carotid bruit,   Heart:  Regular rate and rhythm without murmurs,rubs, or gallops  Lungs:  Clear to auscultation without rales wheezes or rhonchi  Abdomen:  Soft, nontender nondistended, normoactive bowel sounds  Extremities:  There is notable swelling to the bilateral lower extremities, left greater than right, patient is wearing compression socks today, bilateral lower extremities warm to touch, palpable bilateral posterior tibialis pulses  Skin:  Warm, dry    PERTINENT DIAGNOSTICS: EKG performed in the office today shows sinus bradycardia 45 beats per minute with first-degree AV block unchanged from prior    IMPRESSION:  Assessment/Plan   1. Atrial flutter, unspecified type (CMS HCC)    2. Essential hypertension    3. Mixed hyperlipidemia    4. Paroxysmal atrial flutter (CMS HCC)    5. Left bundle branch hemiblock    6. DOE (dyspnea on exertion)    7. Edema, unspecified type    8. Leg pain          PLAN:  1. PAF:  Currently controlled and in sinus bradycardia, continue Eliquis, continue Cardizem and flecainide    2. Hypertension:  Blood pressure in the office today 138/80 however patient has list from home and reports blood pressures in the 150-160 systolic range.  I have sent prescription for increased hydralazine to 25 mg 3 times a day to pharmacy today.  Continue lisinopril    3. Dyspnea on exertion:  Patient's last transthoracic echocardiogram was from 2020 EF was good at that time of 55-60%.  I do feel that this dyspnea on exertion can  likely be coming from his pulmonology issues as it predominantly started to occur after he was taken off of Anoro and placed on Spiriva however I will repeat transthoracic echocardiogram at this time.    4. Lower extremity edema:  Patient reports increasing lower extremity edema left side greater than right side is better with rest and upon awakening, is wearing compression socks that have not been helping much.  I do feel this is likely more vascular insufficiency however I will order a venous Dopplers of the bilateral lower extremity to exclude DVT.  Patient is on Eliquis for stroke prophylaxis in regards to his atrial flutter.       Overall he does seem stable from a cardiac perspective.  I will plan to see him back in 3 months for short-term follow-up.  He can call our office sooner for any acute cardiac concerns.    Chart sent to Dr. Francie Massing for review.  He was readily available at time of office visit for discussion regarding this patient  Orders Placed This Encounter   . EKG - In Clinic, Same Day (93000)   . TRANSTHORACIC ECHOCARDIOGRAM - ADULT   . PERIPHERAL VENOUS DUPLEX - LOWER   . hydrALAZINE (APRESOLINE) 25 mg Oral Tablet     Total time spent on encounter was 25 minutes. In addition to face to face time with the patient performing history and exam, this includes time spent before and after the visit reviewing records, personally reviewing prior imaging, reviewing prior cardiac studies as available, reviewing prior outpatient notes as available, reviewing the patient's medication record, coordinating with office staff and time spent performing final documentation in the medical record.

## 2021-10-27 ENCOUNTER — Ambulatory Visit (HOSPITAL_COMMUNITY): Payer: Self-pay | Admitting: Student in an Organized Health Care Education/Training Program

## 2021-10-28 ENCOUNTER — Other Ambulatory Visit: Payer: No Typology Code available for payment source | Attending: Family Medicine

## 2021-10-28 ENCOUNTER — Other Ambulatory Visit: Payer: Self-pay

## 2021-10-28 DIAGNOSIS — I10 Essential (primary) hypertension: Secondary | ICD-10-CM | POA: Insufficient documentation

## 2021-10-28 DIAGNOSIS — E782 Mixed hyperlipidemia: Secondary | ICD-10-CM | POA: Insufficient documentation

## 2021-10-28 DIAGNOSIS — Z1159 Encounter for screening for other viral diseases: Secondary | ICD-10-CM | POA: Insufficient documentation

## 2021-10-28 LAB — COMPREHENSIVE METABOLIC PNL, FASTING
ALBUMIN: 4.1 g/dL (ref 3.4–4.8)
ALKALINE PHOSPHATASE: 103 U/L (ref 45–115)
ALT (SGPT): 15 U/L (ref 10–55)
ANION GAP: 11 mmol/L (ref 4–13)
AST (SGOT): 16 U/L (ref 8–45)
BILIRUBIN TOTAL: 1.3 mg/dL (ref 0.3–1.3)
BUN/CREA RATIO: 20 (ref 6–22)
BUN: 21 mg/dL (ref 8–25)
CALCIUM: 8.9 mg/dL (ref 8.8–10.2)
CHLORIDE: 103 mmol/L (ref 96–111)
CO2 TOTAL: 27 mmol/L (ref 23–31)
CREATININE: 1.04 mg/dL (ref 0.75–1.35)
ESTIMATED GFR: 74 mL/min/BSA (ref >=60)
GLUCOSE: 79 mg/dL (ref 70–99)
POTASSIUM: 4.1 mmol/L (ref 3.5–5.1)
PROTEIN TOTAL: 6.5 g/dL (ref 6.0–8.0)
SODIUM: 141 mmol/L (ref 136–145)

## 2021-10-28 LAB — CBC WITH DIFF
BASOPHIL #: <0.1 10*3/uL (ref <=0.20)
BASOPHIL %: 1 %
EOSINOPHIL #: 0.17 10*3/uL (ref <=0.50)
EOSINOPHIL %: 2 %
HCT: 45.8 % (ref 38.9–52.0)
HGB: 15.7 g/dL (ref 13.4–17.5)
IMMATURE GRANULOCYTE #: <0.1 10*3/uL (ref <0.10)
IMMATURE GRANULOCYTE %: 0 % (ref 0–1)
LYMPHOCYTE #: 2.6 10*3/uL (ref 1.00–4.80)
LYMPHOCYTE %: 31 %
MCH: 31.7 pg (ref 26.0–32.0)
MCHC: 34.3 g/dL (ref 31.0–35.5)
MCV: 92.3 fL (ref 78.0–100.0)
MONOCYTE #: 0.61 10*3/uL (ref 0.20–1.10)
MONOCYTE %: 7 %
MPV: 10.5 fL (ref 8.7–12.5)
NEUTROPHIL #: 4.97 10*3/uL (ref 1.50–7.70)
NEUTROPHIL %: 59 %
PLATELETS: 191 10*3/uL (ref 150–400)
RBC: 4.96 10*6/uL (ref 4.50–6.10)
RDW-CV: 12.7 % (ref 11.5–15.5)
WBC: 8.5 10*3/uL (ref 3.7–11.0)

## 2021-10-28 LAB — LIPID PANEL
CHOL/HDL RATIO: 3
CHOLESTEROL: 149 mg/dL (ref 100–200)
HDL CHOL: 50 mg/dL (ref >=50)
LDL CALC: 87 mg/dL (ref <100)
NON-HDL: 99 mg/dL (ref <=190)
TRIGLYCERIDES: 58 mg/dL (ref <150)
VLDL CALC: 9 mg/dL (ref <30)

## 2021-10-28 LAB — HEPATITIS C ANTIBODY SCREEN WITH REFLEX TO HCV PCR: HCV ANTIBODY QUALITATIVE: NEGATIVE

## 2021-10-28 NOTE — Result Encounter Note (Signed)
Bw good, no change in meds

## 2021-11-06 DIAGNOSIS — R262 Difficulty in walking, not elsewhere classified: Secondary | ICD-10-CM | POA: Diagnosis not present

## 2021-11-07 DIAGNOSIS — R262 Difficulty in walking, not elsewhere classified: Secondary | ICD-10-CM | POA: Diagnosis not present

## 2021-11-11 ENCOUNTER — Ambulatory Visit (HOSPITAL_COMMUNITY): Payer: Self-pay | Admitting: Student in an Organized Health Care Education/Training Program

## 2021-11-11 ENCOUNTER — Encounter (HOSPITAL_COMMUNITY): Payer: Self-pay | Admitting: Student in an Organized Health Care Education/Training Program

## 2021-11-11 ENCOUNTER — Ambulatory Visit
Payer: No Typology Code available for payment source | Attending: Student in an Organized Health Care Education/Training Program | Admitting: Student in an Organized Health Care Education/Training Program

## 2021-11-11 ENCOUNTER — Other Ambulatory Visit: Payer: Self-pay

## 2021-11-11 VITALS — BP 142/86 | HR 51 | Temp 97.9°F | Resp 18 | Ht 71.0 in | Wt 267.2 lb

## 2021-11-11 DIAGNOSIS — Z6837 Body mass index (BMI) 37.0-37.9, adult: Secondary | ICD-10-CM

## 2021-11-11 DIAGNOSIS — E669 Obesity, unspecified: Secondary | ICD-10-CM

## 2021-11-11 DIAGNOSIS — J986 Disorders of diaphragm: Secondary | ICD-10-CM

## 2021-11-11 DIAGNOSIS — Z87891 Personal history of nicotine dependence: Secondary | ICD-10-CM

## 2021-11-11 DIAGNOSIS — R0683 Snoring: Secondary | ICD-10-CM

## 2021-11-11 DIAGNOSIS — R0602 Shortness of breath: Secondary | ICD-10-CM

## 2021-11-11 DIAGNOSIS — Z7722 Contact with and (suspected) exposure to environmental tobacco smoke (acute) (chronic): Secondary | ICD-10-CM

## 2021-11-11 DIAGNOSIS — J449 Chronic obstructive pulmonary disease, unspecified: Secondary | ICD-10-CM | POA: Insufficient documentation

## 2021-11-11 DIAGNOSIS — I10 Essential (primary) hypertension: Secondary | ICD-10-CM

## 2021-11-11 MED ORDER — SPIRIVA WITH HANDIHALER 18 MCG AND INHALATION CAPSULES
18.0000 ug | ORAL_CAPSULE | Freq: Every day | RESPIRATORY_TRACT | 5 refills | Status: DC
Start: 2021-11-11 — End: 2022-04-13

## 2021-11-11 NOTE — Progress Notes (Unsigned)
PULMONARY OFFICE VISIT    [name], Thomas, 76 y.o. male  Date of service: 11/11/2021  Date of Birth:  02/19/1946    IMPRESSION:  Mild Chronic obstructive pulmonary disease gold stage a FEV1 84% predicted  Elevated left hemidiaphragm  Suspected sleep apnea  Former smoker  Secondhand Smoke exposure  Obesity  Atrial fibrillation/flutter with history of cardioversion  Hypertension  Left bundle branch block    RECOMMENDATIONS:  Continue Spiriva  Sniff testing to evaluate for diaphragm dysfunction his shortness of breath worsens.  Prior testing from 2020 shows normal diaphragmatic function  Patient defers sleep apnea testing at this time.  Encouraged patient to continue weight loss, given beneficial effect on his or all health and is breathing. Offered weight loss clinic referral but patient wishes to continue to lose weight on his own.  Advised to avoid driving or operative heavy machinery when sleepy.  Return to clinic in 3 months    Orders Placed This Encounter    SPIRIVA WITH HANDIHALER 18 mcg Inhalation Capsule, w/Inhalation Device      Problem List Items Addressed This Visit    None    CHIEF COMPLAINT:  Shortness of breath    HISTORY OF PRESENT ILLNESS:    Thomas [name] is a 76 y.o. male with PMH of COPD COLD stage B, former smoker, questionable elevated left diaphragm, obesity, hypertension, atrial fibrillation/flutter with history of cardioversion, left bundle branch block.      From my prior note 01/2021:  Patient is here to establish care.  He was diagnosed with COPD 2 years ago and was started on Anoro.  He states that he has been doing pretty well since he has been on air Anoro he.  He noted significant improvement in his shortness of breath.  He does not need albuterol at home.  He has not had any ED visits or hospitalizations.  He has not had any prednisone or antibiotics to use in the last 2 years.  He is able to walk on flat ground without any limitation in his activity, however he is only able to go up  3 flights of stairs before he gets short of breath.  Patient is High risk for OSA given the history of hypertension, elevated BMI, symptoms of snoring, occasional morning headaches, and Epworth Sleepiness Scale of 10.   Per review of prior records from Care everywhere patient saw Dr. Hadley Pen from pulmonary in [facility] on 04/05/2019 at which time he was planned to be evaluated with stiff testing, sleep apnea testing and pulmonary function testing.  The testing was not completed at that time.    Today:  Patient reports that his breathing has gotten worse but he resumed taking Spiriva and since then he has been feeling better.  He also notes that his blood pressure is decreased since he resumed Spiriva use.  He does not report any other complaints.  He has been doing well with exertion.  We discussed at length the high risk of having sleep apnea and the benefits of treating it.  The patient at this time defers testing as he does not want to be bound to wearing a CPAP while going to sleep.  We also discussed alternative therapies if he is found to have sleep apnea however at this time he wishes not to pursue further testing.    Symptoms:  Shortness of breath on exertion  SABA use: none  Triggers:  Exertion.  No known allergic triggers  Intubations:  None  Exacerbations in the last year:  None  Prednisone use in the last year:  None  Antibiotic use in the last year:  None  Disease severity:  COPD gold stage A  Tobacco:  Patient has been smoking cigars and pipes from age 76 to age 76.   Occupation: Engineer, waterdesign engineer   Exposures: (asbestos, coal, silica, mold): none  Travel: none  Pets/birds: none  Family history of lung disorder: none   Family history of lung cancer: none  Brother has OSA    Past Medical History:   Diagnosis Date    Arthritis     Dysuria     Enteritis due to Rotavirus     Gout     Headache     Hemorrhoid     Hypercholesterolemia     Hypertension      Past Surgical History:   Procedure  Laterality Date    HX HEMORRHOIDECTOMY        Cannot display prior to admission medications because the patient has not been admitted in this contact.          No current facility-administered medications for this visit.    Allergies   Allergen Reactions    Flomax [Tamsulosin] Nausea/ Vomiting     Social History     Tobacco Use    Smoking status: Former     Types: Cigars    Smokeless tobacco: Current     Types: Chew, Snuff   Substance Use Topics    Alcohol use: Yes     Comment: occasional     Family History:  relevant history summarized above in the HPI.   No other family history of pulmonary significance.     ROS:   Other than ROS in the HPI, all other systems were negative.    EXAM:  Temperature: 36.6 C (97.9 F)  Heart Rate: 51  BP (Non-Invasive): (!) 142/86  Respiratory Rate: 18  SpO2: 94 %  General: Seated, in no acute distress  HEENT: Atraumatic normocephalic.   Neck: Trachea in the midline. Supple.   Cardiovascular: S1-S2 regular. No murmur.  Lungs: Good air entry bilaterally. No rales rhonchi or wheezing.  GI: Soft, nontender. No rebound or guarding. Active bowel sounds.  Musculoskeletal: No joint swelling or erythema or tenderness  Extremities: No edema clubbing or cyanosis.  Skin: Dry, warm to touch. Normal turgor.   Neurological: Awake, alert , oriented. Nonfocal.  Psychiatric: Normal mood and affect.      Labs:    No results found for this or any previous visit (from the past 24 hour(s)).   Imaging Studies:      CXR:  None on file    CT CHEST:  CTA CHEST 01/26/2018, report on care everywhere, no images available for my personal interpretation and review  COMPARISON:  CTA chest 12/21/2017.   FINDINGS:   No evidence of acute pulmonary embolism.  No thoracic aortic aneurysm or   dissection.   The heart is normal in size without pericardial effusion.  No pathologically   enlarged lymph nodes in the chest.   Elevation of the left hemidiaphragm with likely left basilar atelectasis.  No   pneumothorax, pleural  effusion, or consolidation.  Central airways are patent.   Nonobstructing left nephrolithiasis.  Upper abdomen otherwise demonstrates no   acute pathology.   Sternum is intact.  Thoracic vertebral body heights are preserved.  No acute   displaced rib fracture identified.  No suspicious bony lesions.   IMPRESSION:  1.  No evidence of acute pulmonary embolism.   2.  Elevation of the left hemidiaphragm with likely left basilar atelectasis.  No pneumothorax, pleural effusion, or consolidation.   3.  Nonobstructing left nephrolithiasis.     PULMONARY FUNCTION TESTING  03/13/2021  RESULTS:  FEV1/FVC ratio is normal at 71%.  FEV1 is normal at 84% of predicted.  FVC is normal at 88% predicted.  Following albuterol, there is no significant improvement in spirometry.    Total lung capacity is normal at 116% of predicted.  Residual volume is increased at 155% of predicted.  Slow vital capacity is normal at 94% of predicted.  Diffusion capacity is reduced at 67% of predicted.  Flow volume loop is obstructed in appearance.  IMPRESSION:  Normal spirometry.    No significant improvement in spirometry following albuterol   Air trapping as indicated by increased residual volume 155% predicted.  Decreased diffusion capacity at 67% predicted, with no significant correction when considering alveolar volume   Though spirometry component is normal, the patient's lung volumes indicate air trapping, diffusion capacity is decreased and flow volume lopo is obstructed appearance.  This pattern suggests an underlying obstructive pulmonary limitation.  Further clinical and radiographic correlation is advised.    Spirometry 01/28/2018      Data:   Spirometry: The FVC is 3.6 L or 81% predicted which is normal.  The FEV1 is 2.36 L or 71% predicted which is mildly reduced.  The FEV1 to FVC ratio is 0.66 which is low.  MVV is 82 L/min which is what is expected for this FEV1.   Interpretation: There is mild airflow obstruction.  There is no previous  spirometry to compare this to.     Total time spent on encounter was 38 minutes. In addition to face to face time with the patient performing history and exam, this includes time spent before and after the visit reviewing records, personally reviewing prior imaging, reviewing prior cardiac studies as available, reviewing prior outpatient notes as available, reviewing the patient's medication record, coordinating with office staff and time spent performing final documentation in the medical record.    Lacretia Leigh, MD

## 2021-11-13 ENCOUNTER — Ambulatory Visit (INDEPENDENT_AMBULATORY_CARE_PROVIDER_SITE_OTHER): Payer: Self-pay | Admitting: Family Medicine

## 2021-11-13 MED ORDER — APIXABAN 5 MG TABLET
5.0000 mg | ORAL_TABLET | Freq: Two times a day (BID) | ORAL | 3 refills | Status: DC
Start: 2021-11-13 — End: 2022-07-24

## 2021-11-13 NOTE — Telephone Encounter (Signed)
Regarding: advise  ----- Message from Marden Noble sent at 11/13/2021  3:16 PM EDT -----  Rhoderick Moody, DO    Pt requesting a call back to discuss medication below.  States that PCP has been prescribing and need a refill    apixaban (ELIQUIS) 5 mg Oral Tablet    Thanks  Marden Noble

## 2021-11-13 NOTE — Telephone Encounter (Signed)
Regarding: Gordan Payment Pt// Rx Refill // New Pharmacy  ----- Message from Sharlet Salina sent at 11/12/2021  4:54 PM EDT -----  Rhoderick Moody, DO    Pt called in requesting a refill of the following Rx to be sent to the new pharmacy below.     --apixaban (ELIQUIS) 5 mg Oral Tablet, Take 1 Tablet (5 mg total) by mouth    Preferred Pharmacy      [facility] - [place], [place] - [address]    [address] [place] [address]    Phone: [phone] Fax: [phone]    Hours: Not open 24 hours        Thanks,  Autumn Whisper [employer]

## 2021-11-13 NOTE — Addendum Note (Signed)
Addended byShanda Howells on: 11/13/2021 04:07 PM     Modules accepted: Orders

## 2021-11-13 NOTE — Telephone Encounter (Signed)
This is historical on his med list, is this something you can give him or should it be a specialist?

## 2021-11-13 NOTE — Telephone Encounter (Signed)
He follows with cardiology, im assuming he gets it from them.

## 2021-11-13 NOTE — Telephone Encounter (Signed)
Yes.  Okay for [name] to fill.  Signed

## 2021-11-13 NOTE — Telephone Encounter (Signed)
See other TE from today about this.

## 2021-11-13 NOTE — Telephone Encounter (Addendum)
Pt read me the bottle he has that says your name on it. Do you want to continue filling this or should it be from cardiology?    He has a week and a half left. He gets it from [facility] which helps cover the cost.  Theres a from 04/23/21 where you do fill out the form.   Actually it says 90 and three on it.   Ill have to call and find out what is going on.      I called [facility], they said they see there are still 3 refills and are not sure why his newest refill wasn't sent out.  She transferred me to [facility] who ships out the orders and Lynnea Ferrier explained they do not have the refills that [employer] needs to release the refills to them. She put me on hold to call [facility] back to get the refills from them.  Kerri came back on the line to explain we didn't put the pt "name "on the form to which i did say we DID and will fax the form to her showing we have. And she explained it would be faster to give a verbal to a pharmacist. I will fax the refills on the form SHOWING the name, and speak to a pharmacist.  She then transferred me and after being transferred three times and a pharmacist still not picking up after i had been on the phone for 31 mins and 11 secs, i hung up.  Please electronically send this medication to [facility].

## 2021-11-13 NOTE — Telephone Encounter (Signed)
Lm asking about this

## 2021-11-13 NOTE — Addendum Note (Signed)
Addended by: Rhoderick Moody on: 11/13/2021 05:00 PM     Modules accepted: Orders

## 2021-11-17 DIAGNOSIS — R262 Difficulty in walking, not elsewhere classified: Secondary | ICD-10-CM | POA: Diagnosis not present

## 2021-11-17 DIAGNOSIS — E1142 Type 2 diabetes mellitus with diabetic polyneuropathy: Secondary | ICD-10-CM | POA: Diagnosis not present

## 2021-11-17 DIAGNOSIS — I10 Essential (primary) hypertension: Secondary | ICD-10-CM | POA: Diagnosis not present

## 2021-11-17 DIAGNOSIS — I25119 Atherosclerotic heart disease of native coronary artery with unspecified angina pectoris: Secondary | ICD-10-CM | POA: Diagnosis not present

## 2021-11-17 DIAGNOSIS — N529 Male erectile dysfunction, unspecified: Secondary | ICD-10-CM | POA: Diagnosis not present

## 2021-11-17 DIAGNOSIS — Z6841 Body Mass Index (BMI) 40.0 and over, adult: Secondary | ICD-10-CM | POA: Diagnosis not present

## 2021-11-17 DIAGNOSIS — N400 Enlarged prostate without lower urinary tract symptoms: Secondary | ICD-10-CM | POA: Diagnosis not present

## 2021-11-17 DIAGNOSIS — G43909 Migraine, unspecified, not intractable, without status migrainosus: Secondary | ICD-10-CM | POA: Diagnosis not present

## 2021-11-17 DIAGNOSIS — M4800 Spinal stenosis, site unspecified: Secondary | ICD-10-CM | POA: Diagnosis not present

## 2021-11-17 DIAGNOSIS — R69 Illness, unspecified: Secondary | ICD-10-CM | POA: Diagnosis not present

## 2021-11-17 DIAGNOSIS — M1990 Unspecified osteoarthritis, unspecified site: Secondary | ICD-10-CM | POA: Diagnosis not present

## 2021-11-17 DIAGNOSIS — E785 Hyperlipidemia, unspecified: Secondary | ICD-10-CM | POA: Diagnosis not present

## 2021-11-19 DIAGNOSIS — R262 Difficulty in walking, not elsewhere classified: Secondary | ICD-10-CM | POA: Diagnosis not present

## 2021-11-24 DIAGNOSIS — R262 Difficulty in walking, not elsewhere classified: Secondary | ICD-10-CM | POA: Diagnosis not present

## 2021-11-25 ENCOUNTER — Ambulatory Visit (INDEPENDENT_AMBULATORY_CARE_PROVIDER_SITE_OTHER): Payer: Medicare HMO

## 2021-11-25 ENCOUNTER — Telehealth: Payer: Self-pay

## 2021-11-25 DIAGNOSIS — Z0000 Encounter for general adult medical examination without abnormal findings: Secondary | ICD-10-CM | POA: Diagnosis not present

## 2021-11-25 NOTE — Patient Instructions (Signed)
[name] , Thank you for taking time to come for your Medicare Wellness Visit. I appreciate your ongoing commitment to your health goals. Please review the following plan we discussed and let me know if I can assist you in the future.   Screening recommendations/referrals: Colonoscopy: last done 10/30/2014 Recommended yearly ophthalmology/optometry visit for glaucoma screening and checkup Recommended yearly dental visit for hygiene and checkup  Vaccinations: Influenza vaccine: 02/25/2021 Pneumococcal vaccine: 04/03/2010, 03/15/2015 Tdap vaccine: 04/15/2020; due every 10 years Shingles vaccine: 06/19/2021, 09/15/2021   Covid-19: 07/31/2019, 08/29/2019, 05/07/2020, 06/19/2021  Advanced directives: Yes  Conditions/risks identified: Yes  Next appointment: Please schedule your next Medicare Wellness Visit with your Nurse Health Advisor in 1 year by calling [phone].  Preventive Care 18 Years and Older, Male Preventive care refers to lifestyle choices and visits with your health care provider that can promote health and wellness. What does preventive care include? A yearly physical exam. This is also called an annual well check. Dental exams once or twice a year. Routine eye exams. Ask your health care provider how often you should have your eyes checked. Personal lifestyle choices, including: Daily care of your teeth and gums. Regular physical activity. Eating a healthy diet. Avoiding tobacco and drug use. Limiting alcohol use. Practicing safe sex. Taking low doses of aspirin every day. Taking vitamin and mineral supplements as recommended by your health care provider. What happens during an annual well check? The services and screenings done by your health care provider during your annual well check will depend on your age, overall health, lifestyle risk factors, and family history of disease. Counseling  Your health care provider may ask you questions about your: Alcohol use. Tobacco  use. Drug use. Emotional well-being. Home and relationship well-being. Sexual activity. Eating habits. History of falls. Memory and ability to understand (cognition). Work and work Statistician. Screening  You may have the following tests or measurements: Height, weight, and BMI. Blood pressure. Lipid and cholesterol levels. These may be checked every 5 years, or more frequently if you are over 57 years old. Skin check. Lung cancer screening. You may have this screening every year starting at age 62 if you have a 30-pack-year history of smoking and currently smoke or have quit within the past 15 years. Fecal occult blood test (FOBT) of the stool. You may have this test every year starting at age 34. Flexible sigmoidoscopy or colonoscopy. You may have a sigmoidoscopy every 5 years or a colonoscopy every 10 years starting at age 55. Prostate cancer screening. Recommendations will vary depending on your family history and other risks. Hepatitis C blood test. Hepatitis B blood test. Sexually transmitted disease (STD) testing. Diabetes screening. This is done by checking your blood sugar (glucose) after you have not eaten for a while (fasting). You may have this done every 1-3 years. Abdominal aortic aneurysm (AAA) screening. You may need this if you are a current or former smoker. Osteoporosis. You may be screened starting at age 95 if you are at high risk. Talk with your health care provider about your test results, treatment options, and if necessary, the need for more tests. Vaccines  Your health care provider may recommend certain vaccines, such as: Influenza vaccine. This is recommended every year. Tetanus, diphtheria, and acellular pertussis (Tdap, Td) vaccine. You may need a Td booster every 10 years. Zoster vaccine. You may need this after age 5. Pneumococcal 13-valent conjugate (PCV13) vaccine. One dose is recommended after age 28. Pneumococcal polysaccharide (PPSV23) vaccine.  One dose is recommended after age 55. Talk to your health care provider about which screenings and vaccines you need and how often you need them. This information is not intended to replace advice given to you by your health care provider. Make sure you discuss any questions you have with your health care provider. Document Released: 06/28/2015 Document Revised: 02/19/2016 Document Reviewed: 04/02/2015 Elsevier Interactive Patient Education  2017 [employer] Prevention in the Home Falls can cause injuries. They can happen to people of all ages. There are many things you can do to make your home safe and to help prevent falls. What can I do on the outside of my home? Regularly fix the edges of walkways and driveways and fix any cracks. Remove anything that might make you trip as you walk through a door, such as a raised step or threshold. Trim any bushes or trees on the path to your home. Use bright outdoor lighting. Clear any walking paths of anything that might make someone trip, such as rocks or tools. Regularly check to see if handrails are loose or broken. Make sure that both sides of any steps have handrails. Any raised decks and porches should have guardrails on the edges. Have any leaves, snow, or ice cleared regularly. Use sand or salt on walking paths during winter. Clean up any spills in your garage right away. This includes oil or grease spills. What can I do in the bathroom? Use night lights. Install grab bars by the toilet and in the tub and shower. Do not use towel bars as grab bars. Use non-skid mats or decals in the tub or shower. If you need to sit down in the shower, use a plastic, non-slip stool. Keep the floor dry. Clean up any water that spills on the floor as soon as it happens. Remove soap buildup in the tub or shower regularly. Attach bath mats securely with double-sided non-slip rug tape. Do not have throw rugs and other things on the floor that can make  you trip. What can I do in the bedroom? Use night lights. Make sure that you have a light by your bed that is easy to reach. Do not use any sheets or blankets that are too big for your bed. They should not hang down onto the floor. Have a firm chair that has side arms. You can use this for support while you get dressed. Do not have throw rugs and other things on the floor that can make you trip. What can I do in the kitchen? Clean up any spills right away. Avoid walking on wet floors. Keep items that you use a lot in easy-to-reach places. If you need to reach something above you, use a strong step stool that has a grab bar. Keep electrical cords out of the way. Do not use floor polish or wax that makes floors slippery. If you must use wax, use non-skid floor wax. Do not have throw rugs and other things on the floor that can make you trip. What can I do with my stairs? Do not leave any items on the stairs. Make sure that there are handrails on both sides of the stairs and use them. Fix handrails that are broken or loose. Make sure that handrails are as long as the stairways. Check any carpeting to make sure that it is firmly attached to the stairs. Fix any carpet that is loose or worn. Avoid having throw rugs at the top or bottom of the  stairs. If you do have throw rugs, attach them to the floor with carpet tape. Make sure that you have a light switch at the top of the stairs and the bottom of the stairs. If you do not have them, ask someone to add them for you. What else can I do to help prevent falls? Wear shoes that: Do not have high heels. Have rubber bottoms. Are comfortable and fit you well. Are closed at the toe. Do not wear sandals. If you use a stepladder: Make sure that it is fully opened. Do not climb a closed stepladder. Make sure that both sides of the stepladder are locked into place. Ask someone to hold it for you, if possible. Clearly mark and make sure that you can  see: Any grab bars or handrails. First and last steps. Where the edge of each step is. Use tools that help you move around (mobility aids) if they are needed. These include: Canes. Walkers. Scooters. Crutches. Turn on the lights when you go into a dark area. Replace any light bulbs as soon as they burn out. Set up your furniture so you have a clear path. Avoid moving your furniture around. If any of your floors are uneven, fix them. If there are any pets around you, be aware of where they are. Review your medicines with your doctor. Some medicines can make you feel dizzy. This can increase your chance of falling. Ask your doctor what other things that you can do to help prevent falls. This information is not intended to replace advice given to you by your health care provider. Make sure you discuss any questions you have with your health care provider. Document Released: 03/28/2009 Document Revised: 11/07/2015 Document Reviewed: 07/06/2014 Elsevier Interactive Patient Education  2017 [employer].

## 2021-11-25 NOTE — Telephone Encounter (Signed)
Patient scheduled for AWV-S via phone at 4:00 pm.  Left v-msg for patient to return call to [phone] to complete visit.

## 2021-11-25 NOTE — Progress Notes (Signed)
I connected with Tanner [name] today by telephone and verified that I am speaking with the correct person using two identifiers. Location patient: home Location provider: work Persons participating in the virtual visit: patient, provider.   I discussed the limitations, risks, security and privacy concerns of performing an evaluation and management service by telephone and the availability of in person appointments. I also discussed with the patient that there may be a patient responsible charge related to this service. The patient expressed understanding and verbally consented to this telephonic visit.    Interactive audio and video telecommunications were attempted between this provider and patient, however failed, due to patient having technical difficulties OR patient did not have access to video capability.  We continued and completed visit with audio only.  Some vital signs may be absent or patient reported.   Time Spent with patient on telephone encounter: 30 minutes  Subjective:   Tanner [name] is a 76 y.o. male who presents for Medicare Annual/Subsequent preventive examination.  Review of Systems     Cardiac Risk Factors include: advanced age (>28mn, >>91women);dyslipidemia;family history of premature cardiovascular disease;hypertension;male gender;obesity (BMI >30kg/m2)     Objective:    There were no vitals filed for this visit. There is no height or weight on file to calculate BMI.     11/25/2021    4:13 PM 07/10/2021   11:09 AM 09/06/2020   11:08 AM 08/20/2020    1:00 PM 07/04/2020   12:14 PM 06/04/2020    2:54 PM 01/02/2020   11:50 AM  Advanced Directives  Does Patient Have a Medical Advance Directive? Yes Yes Yes Yes No Yes No  Type of Advance Directive Living will;Healthcare Power of [place]  [place]   Does  patient want to make changes to medical advance directive? No - Patient declined  No - Patient declined   No - Patient declined   Copy of [place] Chart? No - copy requested  No - copy requested   No - copy requested   Would patient like information on creating a medical advance directive?     No - Patient declined  No - Patient declined    Current Medications (verified) Outpatient Encounter Medications as of 11/25/2021  Medication Sig   acetaminophen (TYLENOL) 500 MG tablet Take 1,000 mg by mouth every 6 (six) hours as needed for moderate pain.   acidophilus (RISAQUAD) CAPS capsule Take 1 capsule by mouth daily.   apixaban (ELIQUIS) 5 MG TABS tablet Take 5 mg by mouth 2 (two) times daily.   atenolol (TENORMIN) 25 MG tablet Take 25 mg by mouth daily.   atorvastatin (LIPITOR) 80 MG tablet Take 40 mg by mouth daily.   buPROPion (WELLBUTRIN SR) 150 MG 12 hr tablet Take 150 mg by mouth 2 (two) times daily.   clobetasol ointment (TEMOVATE) 0.05 % APPLY SMALL AMOUNT TOPICALLY EVERY DAY AS NEEDED DO NOT USE ON FACE OR GENITALS   finasteride (PROSCAR) 5 MG tablet Take 5 mg by mouth at bedtime.   furosemide (LASIX) 20 MG tablet Take 2 tablets (40 mg total) by mouth 2 (two) times daily. (Patient taking differently: Take 20-40 mg by mouth daily.)   gabapentin (NEURONTIN) 300 MG capsule Take 300-600 mg by mouth See admin instructions. Take 600 mg by mouth three times daily and  300 mg at night   ketoconazole (NIZORAL)  2 % shampoo Apply 1 application topically every other day.   Menthol-Methyl Salicylate (THERA-GESIC PLUS) CREA Apply 1 application topically at bedtime. Apply to lower back   Multiple Vitamins-Minerals (MULTIVITAMINS THER. W/MINERALS) TABS Take 1 tablet by mouth daily.   nortriptyline (PAMELOR) 10 MG capsule 2 CAPSULES EVERY NIGHT AT BEDTIME   potassium chloride (KLOR-CON) 10 MEQ tablet Take 4 tablets (40 mEq total) by mouth 2 (two) times daily. (Patient taking  differently: Take 20 mEq by mouth daily.)   saccharomyces boulardii (FLORASTOR) 250 MG capsule Take 1 capsule (250 mg total) by mouth 2 (two) times daily. (Patient taking differently: Take 250 mg by mouth daily.)   sildenafil (VIAGRA) 100 MG tablet Take 100 mg by mouth as needed.   No facility-administered encounter medications on file as of 11/25/2021.    Allergies (verified) Lisinopril, Amlodipine, and Codeine sulfate   History: Past Medical History:  Diagnosis Date   Acute kidney failure (HCC)    Anxiety    Arthritis    Back pain    BPH (benign prostatic hypertrophy)    Clostridium difficile infection    Clotting disorder ([place])    Depression    Diabetes ([place]) 12/11/2016   type 2    DVT (deep venous thrombosis) (HCC)    DVT of deep femoral vein, right ([place]) 06/29/2018   Edema, lower extremity    High cholesterol    Hypertension    Joint pain    Low back pain potentially associated with spinal stenosis    Neuromuscular disorder (HCC)    Neuropathy of lower extremity    bilateral   OSA (obstructive sleep apnea)    pt denies    Osteoarthritis    PE (pulmonary embolism)    Pneumonia    hx of x 2    Ventral hernia    Past Surgical History:  Procedure Laterality Date   EYE SURGERY     HERNIA REPAIR  1999   TOTAL KNEE ARTHROPLASTY Left 08/26/2020   Procedure: LEFT TOTAL KNEE ARTHROPLASTY;  Surgeon: Frederik Pear, MD;  Location: [facility];  Service: Orthopedics;  Laterality: Left;   Family History  Problem Relation Age of Onset   Diabetes Mother    Pneumonia Mother    Alzheimer's disease Mother    Hyperlipidemia Mother    Thyroid disease Mother    Depression Mother    Other Father 42       Drowned on boating accident   Colon cancer Neg Hx    Colon polyps Neg Hx    Social History   Socioeconomic History   Marital status: Widowed    Spouse name: Not on file   Number of children: 2   Years of education: Not on file   Highest education level: Not on file   Occupational History   Occupation: Retired  Tobacco Use   Smoking status: Former    Packs/day: 2.00    Years: 33.00    Total pack years: 66.00    Types: Cigarettes    Start date: 08/30/1968    Quit date: 07/02/2001    Years since quitting: 20.4   Smokeless tobacco: Never   Tobacco comments:    quit 12 years ago  Vaping Use   Vaping Use: Never used  Substance and Sexual Activity   Alcohol use: Yes    Comment: seldom    Drug use: No   Sexual activity: Not Currently  Other Topics Concern   Not on file  Social History Narrative  [employer]   [employer]-2 years   Married '66    2 sons '68, '72; 3 grandchildren   Sales-petroleum Occupational hygienist.  Retired.    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    Social Determinants of Health   Financial Resource Strain: Low Risk  (11/25/2021)   Overall Financial Resource Strain (CARDIA)    Difficulty of Paying Living Expenses: Not hard at all  Food Insecurity: No Food Insecurity (11/25/2021)   Hunger Vital Sign    Worried About Running Out of Food in the Last Year: Never true    Ran Out of Food in the Last Year: Never true  Transportation Needs: No Transportation Needs (11/25/2021)   PRAPARE - Hydrologist (Medical): No    Lack of Transportation (Non-Medical): No  Physical Activity: Sufficiently Active (11/25/2021)   Exercise Vital Sign    Days of Exercise per Week: 4 days    Minutes of Exercise per Session: 60 min  Stress: No Stress Concern Present (11/25/2021)   [place]    Feeling of Stress : Not at all  Social Connections: Unknown (11/25/2021)   Social Connection and Isolation Panel [NHANES]    Frequency of Communication with Friends and Family: Patient refused    Frequency of Social Gatherings with Friends and Family: Patient refused    Attends Religious Services: Patient refused    Marine scientist or Organizations: Patient refused    Attends  Music therapist: Patient refused    Marital Status: Not on file    Tobacco Counseling Counseling given: Not Answered Tobacco comments: quit 12 years ago   Clinical Intake:  Pre-visit preparation completed: Yes  Pain : No/denies pain     BMI - recorded: 43.93 Nutritional Status: BMI > 30  Obese Nutritional Risks: None Diabetes: Yes CBG done?: No Did pt. bring in CBG monitor from home?: No  How often do you need to have someone help you when you read instructions, pamphlets, or other written materials from your doctor or pharmacy?: 1 - Never What is the last grade level you completed in school?: 2.5 years of college  Diabetic? yes  Interpreter Needed?: No  Information entered by :: Lisette Abu, LPN.   Activities of Daily Living    11/25/2021    4:18 PM  In your present state of health, do you have any difficulty performing the following activities:  Hearing? 1  Comment wears hearing aids  Vision? 0  Difficulty concentrating or making decisions? 0  Walking or climbing stairs? 0  Dressing or bathing? 0  Doing errands, shopping? 0  Preparing Food and eating ? N  Using the Toilet? N  In the past six months, have you accidently leaked urine? N  Do you have problems with loss of bowel control? N  Managing your Medications? N  Managing your Finances? N  Housekeeping or managing your Housekeeping? N    Patient Care Team: Hoyt Koch, MD as PCP - General (Internal Medicine) Delice Bison Darnelle Maffucci, [facility] (Pharmacist)  Indicate any recent Medical Services you may have received from other than [facility] providers in the past year (date may be approximate).     Assessment:   This is a routine wellness examination for Ardie.  Hearing/Vision screen Hearing Screening - Comments:: Patient has hearing difficulty and wears hearing aids. Vision Screening - Comments:: Patient does wear corrective lenses/contacts.  [facility] exam [facility] by: [facility]  Dietary  issues and exercise activities discussed: Current Exercise Habits: Structured exercise class (In rehab with orthopaedics), Type of exercise: walking, Time (Minutes): 30, Frequency (Times/Week): 5, Weekly Exercise (Minutes/Week): 150, Intensity: Moderate, Exercise limited by: orthopedic condition(s)   Goals Addressed             This Visit's Progress    Continue to stay independent and active.        Depression Screen    11/25/2021    4:15 PM 01/13/2021    8:56 AM 10/12/2019   10:52 AM 09/24/2016   11:38 AM 04/19/2015    1:15 PM 08/31/2013    3:07 PM  PHQ 2/9 Scores  PHQ - 2 Score 0 0 6 0 0 0  PHQ- 9 Score  0 12       Fall Risk    11/25/2021    4:14 PM 01/13/2021    8:56 AM 12/12/2019    2:26 PM 04/07/2017   10:54 AM 03/17/2017   11:07 AM  Fall Risk   Falls in the past year? 0 1 0 Yes Yes  Number falls in past yr: 0 0  2 or more 1  Injury with Fall? 0 1  No No  Risk for fall due to : No Fall Risks   Other (Comment)   Follow up Falls evaluation completed   Falls evaluation completed;Education provided;Falls prevention discussed     FALL RISK PREVENTION PERTAINING TO THE HOME:  Any stairs in or around the home? No  If so, are there any without handrails? No  Home free of loose throw rugs in walkways, pet beds, electrical cords, etc? Yes  Adequate lighting in your home to reduce risk of falls? Yes   ASSISTIVE DEVICES UTILIZED TO PREVENT FALLS:  Life alert? No  Use of a cane, walker or w/c? No  Grab bars in the bathroom? Yes  Shower chair or bench in shower? No  Elevated toilet seat or a handicapped toilet? Yes   TIMED UP AND GO:  Was the test performed? No .  Length of time to ambulate 10 feet: n/a sec.   Appearance of gait: Patient not evaluated for gait during this visit.  Cognitive Function:        11/25/2021    4:21 PM  6CIT Screen  What Year? 0 points  What month? 0 points  What time? 0 points  Count back from 20 0 points  Months in reverse 0 points   Repeat phrase 0 points  Total Score 0 points    Immunizations Immunization History  Administered Date(s) Administered   Fluad Quad(high Dose 65+) 02/22/2019, 04/15/2020   H1N1 05/25/2008   Influenza, High Dose Seasonal PF 02/26/2016, 03/17/2017, 02/28/2018   Influenza, Seasonal, Injecte, Preservative Fre 04/02/2009, 04/03/2010, 03/24/2013, 04/06/2014   Influenza,inj,Quad PF,6+ Mos 03/15/2015, 02/25/2021   Influenza-Unspecified 02/16/2008, 03/11/2011, 05/06/2012, 02/14/2015, 03/31/2016, 03/15/2017, 02/22/2019   Moderna Sars-Covid-2 Vaccination 07/31/2019, 08/29/2019, 05/07/2020   Pneumococcal Conjugate-13 09/11/2013, 03/15/2015   Pneumococcal Polysaccharide-23 04/03/2010   Td 04/15/2020   Tdap 11/27/2008   Zoster, Live 02/05/2010    TDAP status: Up to date  Flu Vaccine status: Up to date  Pneumococcal vaccine status: Up to date  Covid-19 vaccine status: Completed vaccines  Qualifies for Shingles Vaccine? Yes   Zostavax completed Yes   Shingrix Completed?: Yes  Screening Tests Health Maintenance  Topic Date Due   OPHTHALMOLOGY EXAM  Never done   URINE MICROALBUMIN  Never done   COLONOSCOPY (Pts 45-47yr Insurance coverage  will need to be confirmed)  10/29/2017   COVID-19 Vaccine (4 - Booster for Moderna series) 07/02/2020   FOOT EXAM  12/11/2020   HEMOGLOBIN A1C  07/16/2021   INFLUENZA VACCINE  01/13/2022   TETANUS/TDAP  04/15/2030   Pneumonia Vaccine 40+ Years old  Completed   Hepatitis C Screening  Completed   Zoster Vaccines- Shingrix  Completed   HPV VACCINES  Aged Out    Health Maintenance  Health Maintenance Due  Topic Date Due   OPHTHALMOLOGY EXAM  Never done   URINE MICROALBUMIN  Never done   COLONOSCOPY (Pts 45-1yr Insurance coverage will need to be confirmed)  10/29/2017   COVID-19 Vaccine (4 - Booster for Moderna series) 07/02/2020   FOOT EXAM  12/11/2020   HEMOGLOBIN A1C  07/16/2021    Colorectal cancer screening: No longer required.    Lung Cancer Screening: (Low Dose CT Chest recommended if Age 77-80years, 30 pack-year currently smoking OR have quit w/in 15years.) does not qualify.   Lung Cancer Screening Referral: no  Additional Screening:  Hepatitis C Screening: does qualify; Completed 09/15/2017  Vision Screening: Recommended annual ophthalmology exams for early detection of glaucoma and other disorders of the eye. Is the patient up to date with their annual eye exam?  Yes  Who is the provider or what is the name of the [facility] which the patient [facility] If [facility] is not established with a provider, would they like to be referred to a provider to establish care? No .   Dental Screening: Recommended annual dental exams for proper oral hygiene  Community Resource Referral / Chronic Care Management: CRR required this visit?  No   CCM required this visit?  No      Plan:     I have personally reviewed and noted the following in the patient's chart:   Medical and social history Use of alcohol, tobacco or illicit drugs  Current medications and supplements including opioid prescriptions. Patient is not currently taking opioid prescriptions. Functional ability and status Nutritional status Physical activity Advanced directives List of other physicians Hospitalizations, surgeries, and ER visits in previous 12 months Vitals Screenings to include cognitive, depression, and falls Referrals and appointments  In addition, I have reviewed and discussed with patient certain preventive protocols, quality metrics, and best practice recommendations. A written personalized care plan for preventive services as well as general preventive health recommendations were provided to patient.     SSheral Flow LPN   [id number]  Nurse Notes:  There were no vitals filed for this visit. There is no height or weight on file to calculate BMI.

## 2021-11-26 DIAGNOSIS — R262 Difficulty in walking, not elsewhere classified: Secondary | ICD-10-CM | POA: Diagnosis not present

## 2021-12-01 DIAGNOSIS — R262 Difficulty in walking, not elsewhere classified: Secondary | ICD-10-CM | POA: Diagnosis not present

## 2021-12-02 ENCOUNTER — Other Ambulatory Visit: Payer: Self-pay

## 2021-12-02 ENCOUNTER — Ambulatory Visit
Admission: RE | Admit: 2021-12-02 | Discharge: 2021-12-02 | Disposition: A | Discharge status: Home / Self Care | Payer: No Typology Code available for payment source | Source: Ambulatory Visit | Attending: PHYSICIAN ASSISTANT | Admitting: PHYSICIAN ASSISTANT

## 2021-12-02 DIAGNOSIS — R609 Edema, unspecified: Secondary | ICD-10-CM | POA: Insufficient documentation

## 2021-12-02 DIAGNOSIS — M79606 Pain in leg, unspecified: Secondary | ICD-10-CM | POA: Insufficient documentation

## 2021-12-04 ENCOUNTER — Ambulatory Visit (INDEPENDENT_AMBULATORY_CARE_PROVIDER_SITE_OTHER): Payer: Medicare HMO | Admitting: Internal Medicine

## 2021-12-04 ENCOUNTER — Encounter: Payer: Self-pay | Admitting: Internal Medicine

## 2021-12-04 VITALS — BP 130/80 | HR 71 | Resp 18 | Ht 65.5 in | Wt 265.0 lb

## 2021-12-04 DIAGNOSIS — I10 Essential (primary) hypertension: Secondary | ICD-10-CM

## 2021-12-04 DIAGNOSIS — E1169 Type 2 diabetes mellitus with other specified complication: Secondary | ICD-10-CM | POA: Diagnosis not present

## 2021-12-04 DIAGNOSIS — Z6841 Body Mass Index (BMI) 40.0 and over, adult: Secondary | ICD-10-CM | POA: Diagnosis not present

## 2021-12-04 DIAGNOSIS — R002 Palpitations: Secondary | ICD-10-CM

## 2021-12-04 DIAGNOSIS — N1831 Chronic kidney disease, stage 3a: Secondary | ICD-10-CM | POA: Diagnosis not present

## 2021-12-04 LAB — LIPID PANEL
Cholesterol: 105 mg/dL (ref 0–200)
HDL: 30.2 mg/dL — ABNORMAL LOW (ref >39.00)
LDL Cholesterol: 41 mg/dL (ref 0–99)
NonHDL: 75.27
Total CHOL/HDL Ratio: 3
Triglycerides: 172 mg/dL — ABNORMAL HIGH (ref 0.0–149.0)
VLDL: 34.4 mg/dL (ref 0.0–40.0)

## 2021-12-04 LAB — COMPREHENSIVE METABOLIC PANEL
ALT: 14 U/L (ref 0–53)
AST: 17 U/L (ref 0–37)
Albumin: 4.1 g/dL (ref 3.5–5.2)
Alkaline Phosphatase: 106 U/L (ref 39–117)
BUN: 17 mg/dL (ref 6–23)
CO2: 30 mEq/L (ref 19–32)
Calcium: 9.2 mg/dL (ref 8.4–10.5)
Chloride: 100 mEq/L (ref 96–112)
Creatinine, Ser: 1.45 mg/dL (ref 0.40–1.50)
GFR: 46.61 mL/min — ABNORMAL LOW (ref >60.00)
Glucose, Bld: 73 mg/dL (ref 70–99)
Potassium: 3.6 mEq/L (ref 3.5–5.1)
Sodium: 139 mEq/L (ref 135–145)
Total Bilirubin: 0.7 mg/dL (ref 0.2–1.2)
Total Protein: 6.7 g/dL (ref 6.0–8.3)

## 2021-12-04 LAB — HEMOGLOBIN A1C: Hgb A1c MFr Bld: 5.9 % (ref 4.6–6.5)

## 2021-12-04 LAB — CBC
HCT: 48 % (ref 39.0–52.0)
Hemoglobin: 15.8 g/dL (ref 13.0–17.0)
MCHC: 32.9 g/dL (ref 30.0–36.0)
MCV: 86.1 fl (ref 78.0–100.0)
Platelets: 192 10*3/uL (ref 150.0–400.0)
RBC: 5.58 Mil/uL (ref 4.22–5.81)
RDW: 15.8 % — ABNORMAL HIGH (ref 11.5–15.5)
WBC: 9.4 10*3/uL (ref 4.0–10.5)

## 2021-12-04 LAB — VITAMIN D 25 HYDROXY (VIT D DEFICIENCY, FRACTURES): VITD: 49.73 ng/mL (ref 30.00–100.00)

## 2021-12-04 LAB — VITAMIN B12: Vitamin B-12: >1504 pg/mL — ABNORMAL HIGH (ref 211–911)

## 2021-12-04 LAB — TSH: TSH: 4.05 u[IU]/mL (ref 0.35–5.50)

## 2021-12-04 LAB — T4, FREE: Free T4: 0.97 ng/dL (ref 0.60–1.60)

## 2021-12-04 NOTE — Patient Instructions (Addendum)
We have done the EKG today and will check the labs. If these all come back normal we will order a heart monitor to your house to check for the heart racing.

## 2021-12-04 NOTE — Progress Notes (Unsigned)
   Subjective:   Patient ID: Tanner [name], male    DOB: 06-Jul-1945, 76 y.o.   MRN: [id number]  HPI   Review of Systems  Objective:  Physical Exam  There were no vitals filed for this visit.  Assessment & Plan:

## 2021-12-05 ENCOUNTER — Other Ambulatory Visit: Payer: Self-pay | Admitting: Internal Medicine

## 2021-12-05 ENCOUNTER — Encounter: Payer: Self-pay | Admitting: Internal Medicine

## 2021-12-05 DIAGNOSIS — R002 Palpitations: Secondary | ICD-10-CM

## 2021-12-05 LAB — MICROALBUMIN / CREATININE URINE RATIO
Creatinine,U: 126 mg/dL
Microalb Creat Ratio: 23.1 mg/g (ref 0.0–30.0)
Microalb, Ur: 29.1 mg/dL — ABNORMAL HIGH (ref 0.0–1.9)

## 2021-12-05 NOTE — Assessment & Plan Note (Signed)
BP at goal on lasix 40 mg BID and atenolol 25 mg daily.

## 2021-12-08 ENCOUNTER — Ambulatory Visit (INDEPENDENT_AMBULATORY_CARE_PROVIDER_SITE_OTHER): Payer: Medicare HMO

## 2021-12-08 DIAGNOSIS — R002 Palpitations: Secondary | ICD-10-CM

## 2021-12-08 NOTE — Progress Notes (Unsigned)
Enrolled for [employer] to mail a ZIO XT long term holter monitor to the patients address on file.   DOD to read. 

## 2021-12-10 DIAGNOSIS — R262 Difficulty in walking, not elsewhere classified: Secondary | ICD-10-CM | POA: Diagnosis not present

## 2021-12-12 DIAGNOSIS — R002 Palpitations: Secondary | ICD-10-CM | POA: Diagnosis not present

## 2021-12-18 DIAGNOSIS — R262 Difficulty in walking, not elsewhere classified: Secondary | ICD-10-CM | POA: Diagnosis not present

## 2021-12-23 ENCOUNTER — Other Ambulatory Visit (INDEPENDENT_AMBULATORY_CARE_PROVIDER_SITE_OTHER): Payer: Self-pay | Admitting: Family Medicine

## 2021-12-23 DIAGNOSIS — R262 Difficulty in walking, not elsewhere classified: Secondary | ICD-10-CM | POA: Diagnosis not present

## 2021-12-23 NOTE — Telephone Encounter (Signed)
This should be coming from Cardiology

## 2021-12-23 NOTE — Telephone Encounter (Signed)
This med should be given by Cardiology.  This is his anti arrhythmic

## 2021-12-24 ENCOUNTER — Other Ambulatory Visit: Payer: Self-pay

## 2021-12-24 ENCOUNTER — Ambulatory Visit
Admission: RE | Admit: 2021-12-24 | Discharge: 2021-12-24 | Disposition: A | Discharge status: Home / Self Care | Payer: Commercial Managed Care - PPO | Source: Ambulatory Visit | Attending: PHYSICIAN ASSISTANT | Admitting: PHYSICIAN ASSISTANT

## 2021-12-24 DIAGNOSIS — R0609 Other forms of dyspnea: Secondary | ICD-10-CM | POA: Insufficient documentation

## 2021-12-24 DIAGNOSIS — I10 Essential (primary) hypertension: Secondary | ICD-10-CM | POA: Insufficient documentation

## 2021-12-24 DIAGNOSIS — I4892 Unspecified atrial flutter: Secondary | ICD-10-CM | POA: Insufficient documentation

## 2021-12-29 ENCOUNTER — Other Ambulatory Visit (INDEPENDENT_AMBULATORY_CARE_PROVIDER_SITE_OTHER): Payer: Self-pay | Admitting: Family Medicine

## 2021-12-30 DIAGNOSIS — R262 Difficulty in walking, not elsewhere classified: Secondary | ICD-10-CM | POA: Diagnosis not present

## 2022-01-01 DIAGNOSIS — R002 Palpitations: Secondary | ICD-10-CM | POA: Diagnosis not present

## 2022-01-01 DIAGNOSIS — R262 Difficulty in walking, not elsewhere classified: Secondary | ICD-10-CM | POA: Diagnosis not present

## 2022-01-06 DIAGNOSIS — R262 Difficulty in walking, not elsewhere classified: Secondary | ICD-10-CM | POA: Diagnosis not present

## 2022-01-07 ENCOUNTER — Encounter: Payer: Self-pay | Admitting: Hematology & Oncology

## 2022-01-07 ENCOUNTER — Inpatient Hospital Stay: Payer: Medicare HMO | Attending: Hematology & Oncology

## 2022-01-07 ENCOUNTER — Other Ambulatory Visit: Payer: Self-pay

## 2022-01-07 ENCOUNTER — Inpatient Hospital Stay (HOSPITAL_BASED_OUTPATIENT_CLINIC_OR_DEPARTMENT_OTHER): Payer: Medicare HMO | Admitting: Hematology & Oncology

## 2022-01-07 ENCOUNTER — Ambulatory Visit (HOSPITAL_BASED_OUTPATIENT_CLINIC_OR_DEPARTMENT_OTHER)
Admission: RE | Admit: 2022-01-07 | Discharge: 2022-01-07 | Disposition: A | Discharge status: Home / Self Care | Payer: Medicare HMO | Source: Ambulatory Visit | Attending: Hematology & Oncology | Admitting: Hematology & Oncology

## 2022-01-07 VITALS — BP 159/57 | HR 63 | Temp 97.6°F | Resp 19 | Ht 65.0 in | Wt 260.0 lb

## 2022-01-07 DIAGNOSIS — Z86711 Personal history of pulmonary embolism: Secondary | ICD-10-CM

## 2022-01-07 DIAGNOSIS — I82402 Acute embolism and thrombosis of unspecified deep veins of left lower extremity: Secondary | ICD-10-CM | POA: Diagnosis not present

## 2022-01-07 DIAGNOSIS — M7989 Other specified soft tissue disorders: Secondary | ICD-10-CM | POA: Insufficient documentation

## 2022-01-07 DIAGNOSIS — Z7901 Long term (current) use of anticoagulants: Secondary | ICD-10-CM | POA: Diagnosis not present

## 2022-01-07 DIAGNOSIS — I2699 Other pulmonary embolism without acute cor pulmonale: Secondary | ICD-10-CM | POA: Diagnosis not present

## 2022-01-07 DIAGNOSIS — Z79899 Other long term (current) drug therapy: Secondary | ICD-10-CM | POA: Insufficient documentation

## 2022-01-07 LAB — CBC WITH DIFFERENTIAL (CANCER CENTER ONLY)
Abs Immature Granulocytes: 0.03 10*3/uL (ref 0.00–0.07)
Basophils Absolute: 0.1 10*3/uL (ref 0.0–0.1)
Basophils Relative: 1 %
Eosinophils Absolute: 0.4 10*3/uL (ref 0.0–0.5)
Eosinophils Relative: 5 %
HCT: 47.5 % (ref 39.0–52.0)
Hemoglobin: 15 g/dL (ref 13.0–17.0)
Immature Granulocytes: 0 %
Lymphocytes Relative: 20 %
Lymphs Abs: 1.5 10*3/uL (ref 0.7–4.0)
MCH: 27.8 pg (ref 26.0–34.0)
MCHC: 31.6 g/dL (ref 30.0–36.0)
MCV: 88 fL (ref 80.0–100.0)
Monocytes Absolute: 0.4 10*3/uL (ref 0.1–1.0)
Monocytes Relative: 6 %
Neutro Abs: 5.1 10*3/uL (ref 1.7–7.7)
Neutrophils Relative %: 68 %
Platelet Count: 169 10*3/uL (ref 150–400)
RBC: 5.4 MIL/uL (ref 4.22–5.81)
RDW: 14.7 % (ref 11.5–15.5)
WBC Count: 7.5 10*3/uL (ref 4.0–10.5)
nRBC: 0 % (ref 0.0–0.2)

## 2022-01-07 LAB — CMP (CANCER CENTER ONLY)
ALT: 12 U/L (ref 0–44)
AST: 14 U/L — ABNORMAL LOW (ref 15–41)
Albumin: 4.1 g/dL (ref 3.5–5.0)
Alkaline Phosphatase: 97 U/L (ref 38–126)
Anion gap: 6 (ref 5–15)
BUN: 15 mg/dL (ref 8–23)
CO2: 30 mmol/L (ref 22–32)
Calcium: 9.4 mg/dL (ref 8.9–10.3)
Chloride: 103 mmol/L (ref 98–111)
Creatinine: 1.54 mg/dL — ABNORMAL HIGH (ref 0.61–1.24)
GFR, Estimated: 46 mL/min — ABNORMAL LOW (ref >60)
Glucose, Bld: 132 mg/dL — ABNORMAL HIGH (ref 70–99)
Potassium: 3.8 mmol/L (ref 3.5–5.1)
Sodium: 139 mmol/L (ref 135–145)
Total Bilirubin: 0.7 mg/dL (ref 0.3–1.2)
Total Protein: 6.1 g/dL — ABNORMAL LOW (ref 6.5–8.1)

## 2022-01-07 NOTE — Progress Notes (Signed)
Hematology and Oncology Follow Up Visit  Tanner [name] [id number] 1946-05-27 76 y.o. 01/07/2022   Principle Diagnosis:  Recurrent pulmonary emboli/left leg thrombus -- Idiopathic  Current Therapy:   Eliquis 5 mg p.o. twice daily - started 06/29/2017   Interim History: [name] is here today for follow-up.  The problem that he is having right now is that he has lost swelling in his right leg.  I am unsure exactly what has caused this.  He has had the problem with the blood clot in the left leg.  He is on Eliquis 5 mg twice a day.  We will going to have to get a Doppler of his right leg to see what might be going on.  I suspect this might be lymphedema.  I do not think this is a blood clot.  Leg does not appear for.  There may be a little bit of redness with it.  Otherwise, if he is doing okay.  We last saw him back in October of last year.  He made it through the wintertime.  He seems to be a little bit less alert.  I will know if this might be a little bit of Parkinson that he may have.  He has had no obvious bleeding.  There is no change in bowel or bladder habits.  He has had no rashes.  He has had no fever.  There is been no problems with COVID.  Overall, I would say that his performance status is probably ECOG 2.     Medications:  Allergies as of 01/07/2022       Reactions   Lisinopril Other (See Comments)   Other reaction(s): Renal impairment   Amlodipine Swelling   Codeine Sulfate Itching, Nausea Only        Medication List        Accurate as of January 07, 2022 11:23 AM. If you have any questions, ask your nurse or doctor.          acetaminophen 500 MG tablet Commonly known as: TYLENOL Take 1,000 mg by mouth every 6 (six) hours as needed for moderate pain.   acidophilus Caps capsule Take 1 capsule by mouth daily.   atenolol 25 MG tablet Commonly known as: TENORMIN Take 25 mg by mouth daily.   atorvastatin 80 MG tablet Commonly known as: LIPITOR Take 40  mg by mouth daily.   buPROPion 150 MG 12 hr tablet Commonly known as: WELLBUTRIN SR Take 150 mg by mouth 2 (two) times daily.   clobetasol ointment 0.05 % Commonly known as: TEMOVATE APPLY SMALL AMOUNT TOPICALLY EVERY DAY AS NEEDED DO NOT USE ON FACE OR GENITALS   Eliquis 5 MG Tabs tablet Generic drug: apixaban Take 5 mg by mouth 2 (two) times daily.   finasteride 5 MG tablet Commonly known as: PROSCAR Take 5 mg by mouth at bedtime.   furosemide 20 MG tablet Commonly known as: LASIX Take 2 tablets (40 mg total) by mouth 2 (two) times daily. What changed:  how much to take when to take this   gabapentin 300 MG capsule Commonly known as: NEURONTIN Take 300-600 mg by mouth See admin instructions. Take 600 mg by mouth three times daily and  300 mg at night   ketoconazole 2 % shampoo Commonly known as: NIZORAL Apply 1 application topically every other day.   multivitamins ther. w/minerals Tabs tablet Take 1 tablet by mouth daily.   nortriptyline 10 MG capsule Commonly known as: PAMELOR 2 CAPSULES  EVERY NIGHT AT BEDTIME   potassium chloride 10 MEQ tablet Commonly known as: KLOR-CON Take 4 tablets (40 mEq total) by mouth 2 (two) times daily. What changed:  how much to take when to take this   saccharomyces boulardii 250 MG capsule Commonly known as: FLORASTOR Take 1 capsule (250 mg total) by mouth 2 (two) times daily. What changed: when to take this   sildenafil 100 MG tablet Commonly known as: VIAGRA Take 100 mg by mouth as needed.   Thera-Gesic Plus Crea Apply 1 application topically at bedtime. Apply to lower back        Allergies:  Allergies  Allergen Reactions   Lisinopril Other (See Comments)    Other reaction(s): Renal impairment   Amlodipine Swelling   Codeine Sulfate Itching and Nausea Only    Past Medical History, Surgical history, Social history, and Family History were reviewed and updated.  Review of Systems: Review of Systems   Constitutional: Negative.   HENT: Negative.    Eyes: Negative.   Respiratory: Negative.    Cardiovascular: Negative.   Gastrointestinal: Negative.   Genitourinary: Negative.   Musculoskeletal: Negative.   Skin: Negative.   Neurological: Negative.   Endo/Heme/Allergies: Negative.   Psychiatric/Behavioral: Negative.       Physical Exam:  height is '5\' 5"'$  (1.651 m) and weight is 260 lb (117.9 kg). His oral temperature is 97.6 F (36.4 C). His blood pressure is 159/57 (abnormal) and his pulse is 63. His respiration is 19 and oxygen saturation is 92%.   Wt Readings from Last 3 Encounters:  01/07/22 260 lb (117.9 kg)  12/04/21 265 lb (120.2 kg)  07/10/21 268 lb 0.6 oz (121.6 kg)    Physical Exam Vitals reviewed.  HENT:     Head: Normocephalic and atraumatic.  Eyes:     Pupils: Pupils are equal, round, and reactive to light.  Cardiovascular:     Rate and Rhythm: Normal rate and regular rhythm.     Heart sounds: Normal heart sounds.  Pulmonary:     Effort: Pulmonary effort is normal.     Breath sounds: Normal breath sounds.  Abdominal:     General: Bowel sounds are normal.     Palpations: Abdomen is soft.  Musculoskeletal:        General: No tenderness or deformity. Normal range of motion.     Cervical back: Normal range of motion.  Lymphadenopathy:     Cervical: No cervical adenopathy.  Skin:    General: Skin is warm and dry.     Findings: No erythema or rash.  Neurological:     Mental Status: He is alert and oriented to person, place, and time.  Psychiatric:        Behavior: Behavior normal.        Thought Content: Thought content normal.        Judgment: Judgment normal.      Lab Results  Component Value Date   WBC 7.5 01/07/2022   HGB 15.0 01/07/2022   HCT 47.5 01/07/2022   MCV 88.0 01/07/2022   PLT 169 01/07/2022   Lab Results  Component Value Date   FERRITIN 80 10/12/2019   IRON 86 10/12/2019   TIBC 297 10/12/2019   UIBC 211 10/12/2019    IRONPCTSAT 29 10/12/2019   Lab Results  Component Value Date   RETICCTPCT 2.5 10/12/2019   RBC 5.40 01/07/2022   No results found for: "KPAFRELGTCHN", "LAMBDASER", "KAPLAMBRATIO" Lab Results  Component Value Date   IGGSERUM 549 (  L) 04/07/2017   IGMSERUM 144 04/07/2017   Lab Results  Component Value Date   ALBUMINELP 3.7 (L) 04/07/2017   A1GS 0.4 (H) 04/07/2017   A2GS 0.8 04/07/2017   BETS 0.5 04/07/2017   BETA2SER 0.3 04/07/2017   GAMS 0.6 (L) 04/07/2017   SPEI  04/07/2017     Comment:     . One or more serum protein fractions are outside the normal ranges.  No abnormal protein bands are apparent. .      Chemistry      Component Value Date/Time   NA 139 01/07/2022 1035   NA 143 01/23/2020 1603   NA 136 12/29/2013 1356   K 3.8 01/07/2022 1035   K 3.4 12/29/2013 1356   CL 103 01/07/2022 1035   CL 97 (L) 12/29/2013 1356   CO2 30 01/07/2022 1035   CO2 33 12/29/2013 1356   BUN 15 01/07/2022 1035   BUN 16 01/23/2020 1603   BUN 15 12/29/2013 1356   CREATININE 1.54 (H) 01/07/2022 1035   CREATININE 1.2 12/29/2013 1356   GLU 84 11/07/2014 0000      Component Value Date/Time   CALCIUM 9.4 01/07/2022 1035   CALCIUM 8.9 12/29/2013 1356   ALKPHOS 97 01/07/2022 1035   ALKPHOS 59 12/29/2013 1356   AST 14 (L) 01/07/2022 1035   ALT 12 01/07/2022 1035   ALT 27 12/29/2013 1356   BILITOT 0.7 01/07/2022 1035      Impression and Plan: Mr. Thomann is a very pleasant 76 yo caucasian gentleman with recurrent pulmonary embolism/thromboembolic disease.  He is on lifelong Eliquis.  Again, we will have to see what the ultrasound shows of the right leg.  Again I just do not think this is a blood clot.  The leg just does not feel firm or tight.  For right now, I will have to get back probably about a month or so just that we can follow-up with the leg issue.  If there is no blood clot, he is going to have to go back to his family doctor to have this further evaluated.   Volanda Napoleon, MD 7/26/202311:23 AM

## 2022-01-08 ENCOUNTER — Telehealth: Payer: Self-pay | Admitting: *Deleted

## 2022-01-08 NOTE — Telephone Encounter (Signed)
As noted below by Dr. Marin Olp, I informed the patient that there is no blood clot in the right leg. If you continue to have swelling in that leg, please see your family doctor. He verbalized understanding.

## 2022-01-08 NOTE — Telephone Encounter (Signed)
Per 01/07/22 los - called and gave upcoming appointments - confirmed

## 2022-01-08 NOTE — Telephone Encounter (Signed)
-----   Message from Volanda Napoleon, MD sent at 01/08/2022  8:49 AM EDT ----- Please call let him know that there is no blood clot in the right leg.  I think he needs to go back to see his family doctor for the swelling issue.  Thanks.Tanner [name]

## 2022-01-09 DIAGNOSIS — R262 Difficulty in walking, not elsewhere classified: Secondary | ICD-10-CM | POA: Diagnosis not present

## 2022-01-15 ENCOUNTER — Other Ambulatory Visit: Payer: Self-pay

## 2022-01-15 ENCOUNTER — Encounter (INDEPENDENT_AMBULATORY_CARE_PROVIDER_SITE_OTHER): Payer: Self-pay | Admitting: Medical

## 2022-01-15 ENCOUNTER — Ambulatory Visit: Payer: Commercial Managed Care - PPO | Attending: Medical | Admitting: Medical

## 2022-01-15 VITALS — BP 134/72 | HR 51 | Ht 71.0 in | Wt 263.0 lb

## 2022-01-15 DIAGNOSIS — E782 Mixed hyperlipidemia: Secondary | ICD-10-CM | POA: Insufficient documentation

## 2022-01-15 DIAGNOSIS — I10 Essential (primary) hypertension: Secondary | ICD-10-CM | POA: Insufficient documentation

## 2022-01-15 DIAGNOSIS — R001 Bradycardia, unspecified: Secondary | ICD-10-CM

## 2022-01-15 DIAGNOSIS — I4892 Unspecified atrial flutter: Secondary | ICD-10-CM | POA: Insufficient documentation

## 2022-01-15 DIAGNOSIS — R9431 Abnormal electrocardiogram [ECG] [EKG]: Secondary | ICD-10-CM

## 2022-01-15 LAB — ECG 12 LEAD
Atrial Rate: 50 {beats}/min
Calculated P Axis: 16 degrees
Calculated R Axis: -58 degrees
Calculated T Axis: 25 degrees
PR Interval: 204 ms
QRS Duration: 110 ms
QT Interval: 472 ms
QTC Calculation: 430 ms
Ventricular rate: 50 {beats}/min

## 2022-01-15 NOTE — Progress Notes (Signed)
[place], Thomas [name]  [address]  [place] [address]  [phone]    Thomas [name] is a 76 y.o. male seen in the office on 01/15/2022    Chief Complaint   Patient presents with    Follow Up 3 Months     Paroxysmal atrial flutter          Patient Active Problem List   Diagnosis    Atrial flutter (CMS HCC)    Body mass index (BMI) of 33.0 to 33.9 in adult    Chest tightness    Essential hypertension    Hyperlipidemia    Left bundle branch hemiblock    Paroxysmal atrial flutter (CMS HCC)    History of cardioversion    History of cardiac cath-Overall normal coronary anatomy 12/26/2018    Cardiac left ventricular ejection fraction 55% on TTE 12/24/21       History of Present Illness: This is 76 year old male patient with a past medical history of paroxysmal atrial flutter/fib, hypertension, hyperlipidemia, gout, and chronic shortness of breath-patient is to see pulmonology in this regard.  He was seen one time by Dr. Janyth Contes on 10/20/2017 secondary to atrial flutter.  Holter monitor, echocardiogram, and stress test were completed in the past.  Patient was seen at our office to establish care.  He was complaining of chest pain that radiated into his left arm and palpitations.  Nuclear stress test and Holter monitor was ordered.  Patient underwent nuclear stress test which returned abnormal.  Holter monitor was never completed.  Cardiac catheterization was completed 12/26/2018 which revealed overall normal coronary anatomy with an estimated ejection fraction of 55%.  Dr. Ginger Organ recommended TEE/DC cardioversion as he was in atrial fibrillation/atrial flutter.  TEE was completed revealing an EF of 55-60%.  No evidence of thrombus was identified.  The patient was successfully cardioverted to normal sinus rhythm and initiated on flecainide.  When the patient presented for follow-up following TEE/DC cardioversion he was back in atrial fibrillation.  The  patient wished to continue with rate control and anticoagulation.  His flecainide was discontinued.  Since then, he did convert back to normal sinus and his flecainide was re-initiated.  He has continued to go in and out of atrial fibrillation/flutter since then.     A 7 day event monitor ordered incomplete 06/11/2021 due to symptoms of "heart flutters" revealed rare ventricular ectopic beats with no significant complex ectopy.  No cardiac arrhythmias we were identified.  Minimal heart rate was 40 beats per minute with maximum heart rate of 82 beats per minute and average heart rate 53.     He presents today for routine follow-up with a current list of his blood pressure values.  He has been having some fluctuations.  In the past his systolic readings dropped down to 100 he became symptomatic and therefore hydralazine was discontinued.  He tells me that he takes his antihypertensive therapies in the early morning however fluctuates with the time of dosing depending on when he wakes up.  He is consistent with dosing his p.m. medications.  He remains on Eliquis with no signs or symptoms of bleeding.  His vital signs are stable upon arrival.  Patient voices no additional concerns.  Current Outpatient Medications   Medication Sig    acetaminophen (TYLENOL) 500 mg Oral Tablet Take 1 Tablet (500 mg total) by mouth Every 4 hours as needed for Pain (pt takes tylenol pm)    apixaban (ELIQUIS) 5  mg Oral Tablet Take 1 Tablet (5 mg total) by mouth Twice daily    dilTIAZem (CARDIZEM CD) 240 mg Oral Capsule, Sust. Release 24 hr TAKE ONE CAPSULE BY MOUTH EVERY DAY    flecainide (TAMBOCOR) 100 mg Oral Tablet TAKE ONE TABLET BY MOUTH TWICE DAILY    furosemide (LASIX) 40 mg Oral Tablet TAKE ONE TABLET BY MOUTH EVERY DAY    hydrALAZINE (APRESOLINE) 25 mg Oral Tablet Take 1 Tablet (25 mg total) by mouth Three times a day (Patient not taking: Reported on 01/15/2022)    lisinopriL (PRINIVIL) 40 mg Oral Tablet Take 1 Tablet (40 mg total) by  mouth Once a day    nitroGLYCERIN (NITROSTAT) 0.4 mg Sublingual Tablet, Sublingual Place 1 Tablet (0.4 mg total) under the tongue Every 5 minutes as needed for Chest pain    potassium chloride (KLOR-CON) 10 mEq Oral Tablet Sustained Release TAKE ONE TABLET BY MOUTH EVERY DAY    simvastatin (ZOCOR) 20 mg Oral Tablet Take 1 Tablet (20 mg total) by mouth Every night    SPIRIVA WITH HANDIHALER 18 mcg Inhalation Capsule, w/Inhalation Device Take 1 Capsule (18 mcg total) by inhalation Once a day       Allergies   Allergen Reactions    Flomax [Tamsulosin] Nausea/ Vomiting     Family Medical History:       Problem Relation (Age of Onset)    Cancer Mother    Hypertension (High Blood Pressure) Mother, Father              Social History     Socioeconomic History    Marital status: Widowed   Tobacco Use    Smoking status: Former     Types: Cigars    Smokeless tobacco: Current     Types: Chew, Snuff   Vaping Use    Vaping Use: Never used   Substance and Sexual Activity    Alcohol use: Yes     Comment: occasional    Drug use: Never    Sexual activity: Not Currently     Social Determinants of Health     Financial Resource Strain: Unknown    SDOH Financial: Patient chooses not to answer   Transportation Needs: Unknown    SDOH Transportation: Patient chooses not to answer.   Social Connections: Unknown    SDOH Social Isolation: Patient chooses not to answer   Intimate Partner Violence: Unknown    SDOH Domestic Violence: Patient chooses not to answer.   Housing Stability: Unknown    SDOH Housing Situation: Patient chooses not to answer.    SDOH Housing Worry: Patient chooses not to answer.         Physical Exam:  BP 134/72   Pulse 51   Ht 1.803 m (5\' 11" )   Wt 119 kg (263 lb)   SpO2 94%   BMI 36.68 kg/m       Body mass index is 36.68 kg/m.  Wt Readings from Last 5 Encounters:   01/15/22 119 kg (263 lb)   11/11/21 121 kg (267 lb 3.2 oz)   10/15/21 118 kg (260 lb)   09/30/21 122 kg (269 lb)   07/30/21 118 kg (260 lb)     The  patient is in no distress. Skin is warm and dry. There is no neck vein distention. No carotid bruits heard. The lungs are clear bilaterally. The heart is irregular without any significant murmur, gallop,rub,or click.  The abdomen is obese, normoactive, soft and non-tender. The aorta is  not palpable. Extremities show no significant edema.  Distal pulses are intact.      PERTINENT DIAGNOSTICS: EKG today sinus bradycardia 50 beats per minute  Last lipid profile completed 10/28/2021-cholesterol 149, triglyceride 58, HDL 50, LDL 87    IMPRESSION:  Assessment/Plan   1. Atrial flutter, unspecified type (CMS HCC)    2. Essential hypertension    3. Mixed hyperlipidemia          PLAN:    1. Atrial flutter  -maintaining sinus rhythm with use of Cardizem 240 mg, flecainide 100 mg b.i.d.; continue current therapies  -anticoagulated with Eliquis 5 mg b.i.d..  No signs or symptoms of bleeding with use.  I have re-educated patient on signs and symptoms of bleeding as well as steps to take should bleeding occur.    1. Essential hypertension  -controlled   -patient inconsistent with medication dosing hours therefore leaving window where he has unprotected.  Tells me that he has been taking his blood pressures prior to a.m. dose however a.m. dosing fluctuates.  I have re-educated patient on proper dosing.  I will have him take his lisinopril 40 mg with his evening meds as he is consistent in dosing these at the same hour daily.  He will continue to monitor his pressures closely for response.  No adjustments made in his medical therapies today.    2. Mixed hyperlipidemia   -recent lipid profile shows well well-controlled cholesterol, triglyceride HDL levels however LDL level was above goal noted at 87.  I have ordered and Apolipoprotein B if this remains less than 90 no adjustments will be made in his medical therapies.  I have educated patient is regard.      Patient appears to be stable from a cardiac perspective.  He continues to  have fluctuating blood pressures however I do believe it secondary to his in appropriate dosing of his car antihypertensive medications.  I have re-educated patient on proper dosing instructions.  I will have him continue to monitor his blood pressure values of phone our office if he continues to have elevated values.  I will otherwise have him follow-up in 6 months for routine office visit.  He will phone with any questions, concerns or to obtain earlier appointment if needed.  Dr. Francie Massing was readily available for discussion with regard to this patient's care.  Orders Placed This Encounter    APOLIPOPROTEIN B,SERUM    [facility], In [facility], Same Day ([facility])      Total time spent on encounter was 30 minutes. In addition to face to face time with the patient performing history and exam, this includes time spent before and after the visit reviewing records, personally reviewing prior imaging, reviewing prior cardiac studies as available, reviewing prior outpatient notes as available, reviewing the patient's medication record, coordinating with office staff and time spent performing final documentation in the medical record.

## 2022-01-20 ENCOUNTER — Other Ambulatory Visit (INDEPENDENT_AMBULATORY_CARE_PROVIDER_SITE_OTHER): Payer: Self-pay | Admitting: Family Medicine

## 2022-01-20 NOTE — Telephone Encounter (Signed)
Last Visit:09/30/2021     Upcoming appointments: 04/01/2022           Talbot Grumbling, RN  01/20/2022, 08:52

## 2022-01-21 ENCOUNTER — Encounter (INDEPENDENT_AMBULATORY_CARE_PROVIDER_SITE_OTHER): Payer: Self-pay

## 2022-01-26 ENCOUNTER — Ambulatory Visit (INDEPENDENT_AMBULATORY_CARE_PROVIDER_SITE_OTHER): Payer: Medicare HMO | Admitting: Internal Medicine

## 2022-01-26 ENCOUNTER — Encounter: Payer: Self-pay | Admitting: Internal Medicine

## 2022-01-26 VITALS — BP 126/84 | HR 75 | Resp 18 | Ht 65.0 in | Wt 260.8 lb

## 2022-01-26 DIAGNOSIS — M7989 Other specified soft tissue disorders: Secondary | ICD-10-CM | POA: Diagnosis not present

## 2022-01-26 LAB — BRAIN NATRIURETIC PEPTIDE: Pro B Natriuretic peptide (BNP): 119 pg/mL — ABNORMAL HIGH (ref 0.0–100.0)

## 2022-01-26 MED ORDER — TORSEMIDE 20 MG PO TABS: 20.0000 mg | ORAL_TABLET | Freq: Every day | ORAL | 1 refills | Status: DC

## 2022-01-26 NOTE — Patient Instructions (Signed)
We will check the labs today to rule out heart failure.  We will send in torsemide to take 1-2 pills daily for the swelling. Stop the furosemide.

## 2022-01-26 NOTE — Progress Notes (Signed)
   Subjective:   Patient ID: Tanner [name], male    DOB: [birth year], 76 y.o.   MRN: [id number]  HPI The patient is a 76 YO man coming in for right leg swelling.  Review of Systems  Constitutional: Negative.   HENT: Negative.    Eyes: Negative.   Respiratory:  Negative for cough, chest tightness and shortness of breath.   Cardiovascular:  Positive for leg swelling. Negative for chest pain and palpitations.  Gastrointestinal:  Negative for abdominal distention, abdominal pain, constipation, diarrhea, nausea and vomiting.  Musculoskeletal:  Positive for arthralgias.  Skin: Negative.   Neurological: Negative.   Psychiatric/Behavioral: Negative.      Objective:  Physical Exam Constitutional:      Appearance: He is well-developed.  HENT:     Head: Normocephalic and atraumatic.  Cardiovascular:     Rate and Rhythm: Normal rate and regular rhythm.  Pulmonary:     Effort: Pulmonary effort is normal. No respiratory distress.     Breath sounds: Normal breath sounds. No wheezing or rales.  Abdominal:     General: Bowel sounds are normal. There is no distension.     Palpations: Abdomen is soft.     Tenderness: There is no abdominal tenderness. There is no rebound.  Musculoskeletal:     Cervical back: Normal range of motion.     Right lower leg: Edema present.     Left lower leg: Edema present.     Comments: Color change on bilateral legs consistent with chronic venous stasis 2+ edema to knees bilaterally  Skin:    General: Skin is warm and dry.  Neurological:     Mental Status: He is alert and oriented to person, place, and time.     Coordination: Coordination normal.     Vitals:   01/26/22 1343  BP: 126/84  Pulse: 75  Resp: 18  SpO2: 94%  Weight: 260 lb 12.8 oz (118.3 kg)  Height: '5\' 5"'$  (1.651 m)    Assessment & Plan:

## 2022-01-28 ENCOUNTER — Other Ambulatory Visit: Payer: Self-pay | Admitting: Internal Medicine

## 2022-01-28 DIAGNOSIS — R0600 Dyspnea, unspecified: Secondary | ICD-10-CM

## 2022-01-29 DIAGNOSIS — M7989 Other specified soft tissue disorders: Secondary | ICD-10-CM | POA: Insufficient documentation

## 2022-01-29 NOTE — Assessment & Plan Note (Signed)
Recent labs with stable renal function and liver function. Recent doppler study negative for DVT. Will switch lasix 40 mg daily to torsemide 20-40 mg daily to help better with swelling and checking BNP. If elevated needs ECHO (last done 7 years ago fairly normal unable to assess diastolic function well).

## 2022-02-06 ENCOUNTER — Other Ambulatory Visit: Payer: Self-pay

## 2022-02-06 ENCOUNTER — Encounter: Payer: Self-pay | Admitting: Hematology & Oncology

## 2022-02-06 ENCOUNTER — Inpatient Hospital Stay: Payer: Medicare HMO | Admitting: Hematology & Oncology

## 2022-02-06 ENCOUNTER — Inpatient Hospital Stay: Payer: Medicare HMO | Attending: Hematology & Oncology

## 2022-02-06 VITALS — BP 129/50 | HR 74 | Temp 97.7°F | Resp 19 | Ht 65.0 in | Wt 253.1 lb

## 2022-02-06 DIAGNOSIS — I2699 Other pulmonary embolism without acute cor pulmonale: Secondary | ICD-10-CM | POA: Diagnosis not present

## 2022-02-06 DIAGNOSIS — N183 Chronic kidney disease, stage 3 unspecified: Secondary | ICD-10-CM

## 2022-02-06 DIAGNOSIS — Z7901 Long term (current) use of anticoagulants: Secondary | ICD-10-CM | POA: Diagnosis not present

## 2022-02-06 DIAGNOSIS — I82402 Acute embolism and thrombosis of unspecified deep veins of left lower extremity: Secondary | ICD-10-CM | POA: Diagnosis not present

## 2022-02-06 DIAGNOSIS — Z6841 Body Mass Index (BMI) 40.0 and over, adult: Secondary | ICD-10-CM

## 2022-02-06 DIAGNOSIS — M7989 Other specified soft tissue disorders: Secondary | ICD-10-CM

## 2022-02-06 LAB — CMP (CANCER CENTER ONLY)
ALT: 19 U/L (ref 0–44)
AST: 20 U/L (ref 15–41)
Albumin: 4.3 g/dL (ref 3.5–5.0)
Alkaline Phosphatase: 100 U/L (ref 38–126)
Anion gap: 8 (ref 5–15)
BUN: 20 mg/dL (ref 8–23)
CO2: 31 mmol/L (ref 22–32)
Calcium: 9.4 mg/dL (ref 8.9–10.3)
Chloride: 101 mmol/L (ref 98–111)
Creatinine: 1.69 mg/dL — ABNORMAL HIGH (ref 0.61–1.24)
GFR, Estimated: 41 mL/min — ABNORMAL LOW (ref >60)
Glucose, Bld: 116 mg/dL — ABNORMAL HIGH (ref 70–99)
Potassium: 3.7 mmol/L (ref 3.5–5.1)
Sodium: 140 mmol/L (ref 135–145)
Total Bilirubin: 0.7 mg/dL (ref 0.3–1.2)
Total Protein: 6.7 g/dL (ref 6.5–8.1)

## 2022-02-06 LAB — CBC WITH DIFFERENTIAL (CANCER CENTER ONLY)
Abs Immature Granulocytes: 0.04 10*3/uL (ref 0.00–0.07)
Basophils Absolute: 0.1 10*3/uL (ref 0.0–0.1)
Basophils Relative: 1 %
Eosinophils Absolute: 0.4 10*3/uL (ref 0.0–0.5)
Eosinophils Relative: 5 %
HCT: 48.8 % (ref 39.0–52.0)
Hemoglobin: 15.7 g/dL (ref 13.0–17.0)
Immature Granulocytes: 1 %
Lymphocytes Relative: 19 %
Lymphs Abs: 1.6 10*3/uL (ref 0.7–4.0)
MCH: 28.3 pg (ref 26.0–34.0)
MCHC: 32.2 g/dL (ref 30.0–36.0)
MCV: 87.9 fL (ref 80.0–100.0)
Monocytes Absolute: 0.5 10*3/uL (ref 0.1–1.0)
Monocytes Relative: 6 %
Neutro Abs: 5.8 10*3/uL (ref 1.7–7.7)
Neutrophils Relative %: 68 %
Platelet Count: 183 10*3/uL (ref 150–400)
RBC: 5.55 MIL/uL (ref 4.22–5.81)
RDW: 15.2 % (ref 11.5–15.5)
WBC Count: 8.3 10*3/uL (ref 4.0–10.5)
nRBC: 0 % (ref 0.0–0.2)

## 2022-02-06 NOTE — Progress Notes (Signed)
Hematology and Oncology Follow Up Visit  Tanner [name] [id number] 1945/12/26 76 y.o. 02/06/2022   Principle Diagnosis:  Recurrent pulmonary emboli/left leg thrombus -- Idiopathic  Current Therapy:   Eliquis 5 mg p.o. twice daily - started 06/29/2017   Interim History: Tanner [name] is here today for follow-up.  He is doing much better.  We did do a Doppler of his right leg wound last saw him.  Thankfully, there was no blood clot in the right leg.  He does have a lot of lymphedema.  He saw his family doctor.  She put him on Demadex and switched out the Lasix.  This is helped.  The right leg does not look nearly as swollen.  He is on Eliquis.  He is doing okay on the Eliquis.  There has not been any issues with bleeding.  He has had no change in bowel or bladder habits.  There is been no chest wall pain.  He has had no cough or shortness of breath.  He does get around with a cane.  It sounds like he will be going down to [place] to be with a friend over Labor Day weekend.  Overall, I would say his performance status is probably ECOG 2.  Medications:  Allergies as of 02/06/2022       Reactions   Lisinopril Other (See Comments)   Other reaction(s): Renal impairment   Amlodipine Swelling   Codeine Sulfate Itching, Nausea Only        Medication List        Accurate as of February 06, 2022 12:14 PM. If you have any questions, ask your nurse or doctor.          acetaminophen 500 MG tablet Commonly known as: TYLENOL Take 1,000 mg by mouth every 6 (six) hours as needed for moderate pain.   acidophilus Caps capsule Take 1 capsule by mouth daily.   atenolol 25 MG tablet Commonly known as: TENORMIN Take 25 mg by mouth daily.   atorvastatin 80 MG tablet Commonly known as: LIPITOR Take 40 mg by mouth daily.   buPROPion 150 MG 12 hr tablet Commonly known as: WELLBUTRIN SR Take 150 mg by mouth 2 (two) times daily.   clobetasol ointment 0.05 % Commonly known as:  TEMOVATE APPLY SMALL AMOUNT TOPICALLY EVERY DAY AS NEEDED DO NOT USE ON FACE OR GENITALS   Eliquis 5 MG Tabs tablet Generic drug: apixaban Take 5 mg by mouth 2 (two) times daily.   finasteride 5 MG tablet Commonly known as: PROSCAR Take 5 mg by mouth at bedtime.   gabapentin 300 MG capsule Commonly known as: NEURONTIN Take 300-600 mg by mouth See admin instructions. Take 600 mg by mouth three times daily and  300 mg at night   ketoconazole 2 % shampoo Commonly known as: NIZORAL Apply 1 application topically every other day.   multivitamins ther. w/minerals Tabs tablet Take 1 tablet by mouth daily.   nortriptyline 10 MG capsule Commonly known as: PAMELOR 2 CAPSULES EVERY NIGHT AT BEDTIME   potassium chloride 10 MEQ tablet Commonly known as: KLOR-CON Take 4 tablets (40 mEq total) by mouth 2 (two) times daily. What changed:  how much to take when to take this   saccharomyces boulardii 250 MG capsule Commonly known as: FLORASTOR Take 1 capsule (250 mg total) by mouth 2 (two) times daily. What changed: when to take this   sildenafil 100 MG tablet Commonly known as: VIAGRA Take 100 mg by mouth as  needed.   Thera-Gesic Plus Crea Apply 1 application topically at bedtime. Apply to lower back   torsemide 20 MG tablet Commonly known as: DEMADEX Take 1-2 tablets (20-40 mg total) by mouth daily.        Allergies:  Allergies  Allergen Reactions   Lisinopril Other (See Comments)    Other reaction(s): Renal impairment   Amlodipine Swelling   Codeine Sulfate Itching and Nausea Only    Past Medical History, Surgical history, Social history, and Family History were reviewed and updated.  Review of Systems: Review of Systems  Constitutional: Negative.   HENT: Negative.    Eyes: Negative.   Respiratory: Negative.    Cardiovascular: Negative.   Gastrointestinal: Negative.   Genitourinary: Negative.   Musculoskeletal: Negative.   Skin: Negative.   Neurological:  Negative.   Endo/Heme/Allergies: Negative.   Psychiatric/Behavioral: Negative.       Physical Exam:  height is '5\' 5"'$  (1.651 m) and weight is 253 lb 1.9 oz (114.8 kg). His oral temperature is 97.7 F (36.5 C). His blood pressure is 129/50 (abnormal) and his pulse is 74. His respiration is 19 and oxygen saturation is 93%.   Wt Readings from Last 3 Encounters:  02/06/22 253 lb 1.9 oz (114.8 kg)  01/26/22 260 lb 12.8 oz (118.3 kg)  01/07/22 260 lb (117.9 kg)    Physical Exam Vitals reviewed.  HENT:     Head: Normocephalic and atraumatic.  Eyes:     Pupils: Pupils are equal, round, and reactive to light.  Cardiovascular:     Rate and Rhythm: Normal rate and regular rhythm.     Heart sounds: Normal heart sounds.  Pulmonary:     Effort: Pulmonary effort is normal.     Breath sounds: Normal breath sounds.  Abdominal:     General: Bowel sounds are normal.     Palpations: Abdomen is soft.  Musculoskeletal:        General: No tenderness or deformity. Normal range of motion.     Cervical back: Normal range of motion.  Lymphadenopathy:     Cervical: No cervical adenopathy.  Skin:    General: Skin is warm and dry.     Findings: No erythema or rash.  Neurological:     Mental Status: He is alert and oriented to person, place, and time.  Psychiatric:        Behavior: Behavior normal.        Thought Content: Thought content normal.        Judgment: Judgment normal.      Lab Results  Component Value Date   WBC 8.3 02/06/2022   HGB 15.7 02/06/2022   HCT 48.8 02/06/2022   MCV 87.9 02/06/2022   PLT 183 02/06/2022   Lab Results  Component Value Date   FERRITIN 80 10/12/2019   IRON 86 10/12/2019   TIBC 297 10/12/2019   UIBC 211 10/12/2019   IRONPCTSAT 29 10/12/2019   Lab Results  Component Value Date   RETICCTPCT 2.5 10/12/2019   RBC 5.55 02/06/2022   No results found for: "KPAFRELGTCHN", "LAMBDASER", "KAPLAMBRATIO" Lab Results  Component Value Date   IGGSERUM 549 (L)  04/07/2017   IGMSERUM 144 04/07/2017   Lab Results  Component Value Date   ALBUMINELP 3.7 (L) 04/07/2017   A1GS 0.4 (H) 04/07/2017   A2GS 0.8 04/07/2017   BETS 0.5 04/07/2017   BETA2SER 0.3 04/07/2017   GAMS 0.6 (L) 04/07/2017   SPEI  04/07/2017     Comment:     .  One or more serum protein fractions are outside the normal ranges.  No abnormal protein bands are apparent. .      Chemistry      Component Value Date/Time   NA 140 02/06/2022 1124   NA 143 01/23/2020 1603   NA 136 12/29/2013 1356   K 3.7 02/06/2022 1124   K 3.4 12/29/2013 1356   CL 101 02/06/2022 1124   CL 97 (L) 12/29/2013 1356   CO2 31 02/06/2022 1124   CO2 33 12/29/2013 1356   BUN 20 02/06/2022 1124   BUN 16 01/23/2020 1603   BUN 15 12/29/2013 1356   CREATININE 1.69 (H) 02/06/2022 1124   CREATININE 1.2 12/29/2013 1356   GLU 84 11/07/2014 0000      Component Value Date/Time   CALCIUM 9.4 02/06/2022 1124   CALCIUM 8.9 12/29/2013 1356   ALKPHOS 100 02/06/2022 1124   ALKPHOS 59 12/29/2013 1356   AST 20 02/06/2022 1124   ALT 19 02/06/2022 1124   ALT 27 12/29/2013 1356   BILITOT 0.7 02/06/2022 1124      Impression and Plan: Tanner [name] is a very pleasant 76 yo caucasian gentleman with recurrent pulmonary embolism/thromboembolic disease.  He is on lifelong Eliquis.  Thankfully, the extra diuresis has helped the leg.  Thankfully there is no blood clot.  I just think that he is going to have issues with lymphedema.  Now, we can probably get him back I would think after the Holiday season.  I feel confident with that timeframe.   Volanda Napoleon, MD 8/25/202312:14 PM

## 2022-02-09 ENCOUNTER — Telehealth: Payer: Self-pay | Admitting: *Deleted

## 2022-02-09 NOTE — Telephone Encounter (Signed)
Per 02/06/22 los - called and gave upcoming appointments - confirmed

## 2022-02-18 ENCOUNTER — Ambulatory Visit (HOSPITAL_COMMUNITY): Payer: Self-pay | Admitting: Student in an Organized Health Care Education/Training Program

## 2022-02-19 ENCOUNTER — Ambulatory Visit (HOSPITAL_COMMUNITY): Payer: Medicare HMO | Attending: Cardiovascular Disease

## 2022-02-19 DIAGNOSIS — R0609 Other forms of dyspnea: Secondary | ICD-10-CM | POA: Diagnosis not present

## 2022-02-19 DIAGNOSIS — R0600 Dyspnea, unspecified: Secondary | ICD-10-CM | POA: Diagnosis not present

## 2022-02-19 LAB — ECHOCARDIOGRAM COMPLETE
Area-P 1/2: 4.57 cm2
Calc EF: 46.3 %
MV M vel: 5.8 m/s
MV Peak grad: 134.6 mmHg
Radius: 0.75 cm
S' Lateral: 4.1 cm
Single Plane A2C EF: 44.7 %
Single Plane A4C EF: 48 %

## 2022-02-19 MED ORDER — PERFLUTREN LIPID MICROSPHERE
1.0000 mL | INTRAVENOUS | Status: AC | PRN
  Administered 2022-02-19: 2 mL via INTRAVENOUS

## 2022-02-23 ENCOUNTER — Other Ambulatory Visit: Payer: Self-pay | Admitting: Internal Medicine

## 2022-02-23 DIAGNOSIS — I340 Nonrheumatic mitral (valve) insufficiency: Secondary | ICD-10-CM | POA: Insufficient documentation

## 2022-02-23 DIAGNOSIS — I5022 Chronic systolic (congestive) heart failure: Secondary | ICD-10-CM | POA: Insufficient documentation

## 2022-02-26 ENCOUNTER — Other Ambulatory Visit (INDEPENDENT_AMBULATORY_CARE_PROVIDER_SITE_OTHER): Payer: Self-pay | Admitting: Family Medicine

## 2022-03-26 ENCOUNTER — Ambulatory Visit: Payer: Medicare HMO | Attending: Cardiology | Admitting: Cardiology

## 2022-03-26 ENCOUNTER — Encounter: Payer: Self-pay | Admitting: Cardiology

## 2022-03-26 VITALS — BP 140/78 | HR 79 | Ht 65.0 in | Wt 267.2 lb

## 2022-03-26 DIAGNOSIS — I5022 Chronic systolic (congestive) heart failure: Secondary | ICD-10-CM

## 2022-03-26 DIAGNOSIS — I340 Nonrheumatic mitral (valve) insufficiency: Secondary | ICD-10-CM

## 2022-03-26 NOTE — Patient Instructions (Addendum)
Medication Instructions:  The current medical regimen is effective;  continue present plan and medications.  *If you need a refill on your cardiac medications before your next appointment, please call your pharmacy*  Testing/Procedures: Your physician has requested that you have an echocardiogram. Echocardiography is a painless test that uses sound waves to create images of your heart. It provides your doctor with information about the size and shape of your heart and how well your heart's chambers and valves are working. This procedure takes approximately one hour. There are no restrictions for this procedure.  Follow-Up: At [facility], you and your health needs are our priority.  As part of our continuing mission to provide you with exceptional heart care, we have created designated Provider Care Teams.  These Care Teams include your primary Cardiologist (physician) and Advanced Practice Providers (APPs -  Physician Assistants and Nurse Practitioners) who all work together to provide you with the care you need, when you need it.  We recommend signing up for the patient portal called "MyChart".  Sign up information is provided on this After Visit Summary.  MyChart is used to connect with patients for Virtual Visits (Telemedicine).  Patients are able to view lab/test results, encounter notes, upcoming appointments, etc.  Non-urgent messages can be sent to your provider as well.   To learn more about what you can do with MyChart, go to [email or web address].    Your next appointment:   6 months with PA/NP  The format for your next appointment:   In Person  Provider:  and 1 yr with Dr Marlou Porch      Important Information About Sugar

## 2022-03-26 NOTE — Progress Notes (Signed)
Cardiology Office Note:    Date:  03/26/2022   ID:  Tanner [name], DOB 07-18-1945, MRN [id number]  PCP:  Tanner Koch, MD   [facility] Providers Cardiologist:  None     Referring MD: Tanner [name], *   History of Present Illness:    Tanner [name] is a 76 y.o. male here for the evaluation of moderate mitral regurgitation, mild aortic regurgitation and chronic systolic heart failure, at the request of Dr. Sharlet [name].  He saw his PCP 01/26/2022 and complained of RLE edema. Lasix 40 mg daily was switched to torsemide 20-40 mg daily to help better with swelling and checking BNP. He had an echo 02/19/2022 that revealed LVEF 12-87%, grade 1 diastolic dysfunction, moderate mitral valve regurgitation, trivial aortic regurgitation but no aortic stenosis. This was consistent with heart failure, which was felt to be the cause of his LE edema. He was referred to cardiology for further evaluation and management.  Today, he is accompanied by a family member. He has been experiencing pain in his right knee that is severe enough to cause him to limp while walking. He uses a cane for assistance.   Also he complains of shortness of breath beginning a few months ago. He develops shortness of breath with walking any distance. He noticed that he becomes more short of breath when his knee pain increases, which has been making him more sedentary. It can be difficult for him to stand up from a chair.   While he had been wearing the cardiac monitor, he noticed rapid palpitations that would last anywhere from less than a minute to "quite a while."  He has been compliant with his medications, including atorvastatin and torsemide. Also he is on Eliquis due to prior blood clots. Most recent LDL 41; creatinine 1.7.  He denies any chest pain, lightheadedness, headaches, syncope, orthopnea, or PND.   Past Medical History:  Diagnosis Date   Acute kidney failure (HCC)    Anxiety    Arthritis     Back pain    BPH (benign prostatic hypertrophy)    Clostridium difficile infection    Clotting disorder ([place])    Depression    Diabetes ([place]) 12/11/2016   type 2    DVT (deep venous thrombosis) (HCC)    DVT of deep femoral vein, right ([place]) 06/29/2018   Edema, lower extremity    High cholesterol    Hypertension    Joint pain    Low back pain potentially associated with spinal stenosis    Neuromuscular disorder (HCC)    Neuropathy of lower extremity    bilateral   OSA (obstructive sleep apnea)    pt denies    Osteoarthritis    PE (pulmonary embolism)    Pneumonia    hx of x 2    Ventral hernia     Past Surgical History:  Procedure Laterality Date   EYE SURGERY     HERNIA REPAIR  1999   TOTAL KNEE ARTHROPLASTY Left 08/26/2020   Procedure: LEFT TOTAL KNEE ARTHROPLASTY;  Surgeon: Frederik Pear, MD;  Location: [facility];  Service: Orthopedics;  Laterality: Left;    Current Medications: Current Meds  Medication Sig   acetaminophen (TYLENOL) 500 MG tablet Take 1,000 mg by mouth every 6 (six) hours as needed for moderate pain.   acidophilus (RISAQUAD) CAPS capsule Take 1 capsule by mouth daily.   apixaban (ELIQUIS) 5 MG TABS tablet Take 5 mg by mouth 2 (two) times daily.  atenolol (TENORMIN) 25 MG tablet Take 25 mg by mouth daily.   atorvastatin (LIPITOR) 80 MG tablet Take 40 mg by mouth daily.   buPROPion (WELLBUTRIN SR) 150 MG 12 hr tablet Take 150 mg by mouth 2 (two) times daily.   clobetasol ointment (TEMOVATE) 0.05 % APPLY SMALL AMOUNT TOPICALLY EVERY DAY AS NEEDED DO NOT USE ON FACE OR GENITALS   finasteride (PROSCAR) 5 MG tablet Take 5 mg by mouth at bedtime.   gabapentin (NEURONTIN) 300 MG capsule Take 300-600 mg by mouth See admin instructions. Take 600 mg by mouth three times daily and  300 mg at night   ketoconazole (NIZORAL) 2 % shampoo Apply 1 application topically every other day.   Menthol-Methyl Salicylate (THERA-GESIC PLUS) CREA Apply 1 application topically at  bedtime. Apply to lower back   Multiple Vitamins-Minerals (MULTIVITAMINS THER. W/MINERALS) TABS Take 1 tablet by mouth daily.   nortriptyline (PAMELOR) 10 MG capsule 2 CAPSULES EVERY NIGHT AT BEDTIME   saccharomyces boulardii (FLORASTOR) 250 MG capsule Take 1 capsule (250 mg total) by mouth 2 (two) times daily.   sildenafil (VIAGRA) 100 MG tablet Take 100 mg by mouth as needed.   torsemide (DEMADEX) 20 MG tablet Take 1-2 tablets (20-40 mg total) by mouth daily.     Allergies:   Lisinopril, Amlodipine, and Codeine sulfate   Social History   Socioeconomic History   Marital status: Widowed    Spouse name: Not on file   Number of children: 2   Years of education: Not on file   Highest education level: Not on file  Occupational History   Occupation: Retired  Tobacco Use   Smoking status: Former    Packs/day: 2.00    Years: 33.00    Total pack years: 66.00    Types: Cigarettes    Start date: 08/30/1968    Quit date: 07/02/2001    Years since quitting: 20.7   Smokeless tobacco: Never   Tobacco comments:    quit 12 years ago  Vaping Use   Vaping Use: Never used  Substance and Sexual Activity   Alcohol use: Yes    Comment: seldom    Drug use: No   Sexual activity: Not Currently  Other Topics Concern   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    Social Determinants of Health   Financial Resource Strain: Low Risk  (11/25/2021)   Overall Financial Resource Strain (CARDIA)    Difficulty of Paying Living Expenses: Not hard at all  Food Insecurity: No Food Insecurity (11/25/2021)   Hunger Vital Sign    Worried About Running Out of Food in the Last Year: Never true    Ran Out of Food in the Last Year: Never true  Transportation Needs: No Transportation Needs (11/25/2021)   PRAPARE - Hydrologist  (Medical): No    Lack of Transportation (Non-Medical): No  Physical Activity: Sufficiently Active (11/25/2021)   Exercise Vital Sign    Days of Exercise per Week: 4 days    Minutes of Exercise per Session: 60 min  Stress: No Stress Concern Present (11/25/2021)   [place]    Feeling of Stress : Not at all  Social Connections: Unknown (11/25/2021)  Social Licensed conveyancer [NHANES]    Frequency of Communication with Friends and Family: Patient refused    Frequency of Social Gatherings with Friends and Family: Patient refused    Attends Religious Services: Patient refused    Marine scientist or Organizations: Patient refused    Attends Music therapist: Patient refused    Marital Status: Not on file     Family History: [name] family history includes Alzheimer's disease in his mother; Depression in his mother; Diabetes in his mother; Hyperlipidemia in his mother; Other (age of onset: 11) in his father; Pneumonia in his mother; Thyroid disease in his mother. There is no history of Colon cancer or Colon polyps.  ROS:   Please see the history of present illness.    (+) Palpitations (+) Shortness of breath All other systems reviewed and are negative.  EKGs/Labs/Other Studies Reviewed:    The following studies were reviewed today:  Echo 02/19/2022: IMPRESSIONS   1. Left ventricular ejection fraction, by estimation, is 45 to 50%. The  left ventricle has mildly decreased function. The left ventricle  demonstrates global hypokinesis. The left ventricular internal cavity size  was mildly dilated. Left ventricular  diastolic parameters are consistent with Grade I diastolic dysfunction  (impaired relaxation).   2. Right ventricular systolic function is normal. The right ventricular  size is normal. Tricuspid regurgitation signal is inadequate for assessing  PA pressure.   3. Left atrial  size was mildly dilated.   4. The mitral valve is normal in structure. Moderate mitral valve  regurgitation.   5. The aortic valve is tricuspid. Aortic valve regurgitation is trivial.  No aortic stenosis is present.   6. The inferior vena cava is normal in size with greater than 50%  respiratory variability, suggesting right atrial pressure of 3 mmHg.   Comparison(s): A prior study was performed on 05/30/2017. Prior images  reviewed side by side. Changes from prior study are noted. The left  ventricular function is worsened. The left ventricle is slightly dilated.  There is new moderate mitral insufficiency with an eccentric jet.  Right LE Venous Doppler 01/07/2022: IMPRESSION: No evidence of deep venous thrombosis in the visualized lower extremity veins.  Monitor 12/2021 Indication:palpitations   Duration: 13d   Findings HR  avg 54  Min 51-Max 99  PVCs Rare, less than 1%   PACs Rare, less than 1%   SVT Nonsustained NONE   Symptoms: None    Triggered:     Conclusions: No symptoms noted No Arrhythmias noted     Recommendations: None    EKG:  EKG is personally reviewed and interpreted. 03/26/2022: EKG was not ordered.  12/04/2021 (Dr. Sharlet [name]): Rate 64, axis normal, interval normal, RBBB, LAFB, sinus, no new st or t wave changes, no significant change compared to prior 2021  Recent Labs: 12/04/2021: TSH 4.05 01/26/2022: Pro B Natriuretic peptide (BNP) 119.0 02/06/2022: ALT 19; BUN 20; Creatinine 1.69; Hemoglobin 15.7; Platelet Count 183; Potassium 3.7; Sodium 140   Recent Lipid Panel    Component Value Date/Time   CHOL 105 12/04/2021 1200   CHOL 109 10/12/2019 1459   TRIG 172.0 (H) 12/04/2021 1200   TRIG 187 (H) 03/30/2006 0907   HDL 30.20 (L) 12/04/2021 1200   HDL 30 (L) 10/12/2019 1459   CHOLHDL 3 12/04/2021 1200   VLDL 34.4 12/04/2021 1200   LDLCALC 41 12/04/2021 1200   LDLCALC 50 10/12/2019 1459   LDLDIRECT 65.9 09/13/2007 1028  Risk  Assessment/Calculations:          Physical Exam:    VS:  BP (!) 140/78   Pulse 79   Ht '5\' 5"'$  (1.651 m)   Wt 267 lb 3.2 oz (121.2 kg)   SpO2 96%   BMI 44.46 kg/m     Wt Readings from Last 3 Encounters:  03/26/22 267 lb 3.2 oz (121.2 kg)  02/06/22 253 lb 1.9 oz (114.8 kg)  01/26/22 260 lb 12.8 oz (118.3 kg)     GEN: Well nourished, well developed in no acute distress HEENT: Normal NECK: No JVD; No carotid bruits LYMPHATICS: No lymphadenopathy CARDIAC: RRR, 1/6 aortic valve diastolic murmur, 1/6 mitral valve murmur, no rubs or gallops RESPIRATORY:  Clear to auscultation without rales, wheezing or rhonchi  ABDOMEN: Soft, non-tender, non-distended MUSCULOSKELETAL:  + LE edema with erythema; No deformity  SKIN: Warm and dry NEUROLOGIC:  Alert and oriented x 3 PSYCHIATRIC:  Normal affect   ASSESSMENT:    1. Moderate mitral regurgitation   2. Chronic systolic heart failure (HCC)    PLAN:    In order of problems listed above:  Moderate mitral regurgitation Dyspnea Chronic lower extremity edema HFmrEF CKD3b - We will go ahead and continue to monitor this.  At this point, does not require surgical or structural intervention.  I personally reviewed the echocardiogram images with him.  Agree that this is moderate.  Continue with Demadex/torsemide. -Repeat echocardiogram in 1 year - Dyspnea is likely multifactorial as well.  Since his right knee is bothering him so much, he is spending quite a bit of time in the chair.  Utilizes cane. -Edema is in part from chronic venous insufficiency as well. -Currently on atenolol 25 mg daily as well as torsemide.  I would be careful utilizing SGLT2 inhibitors with his current creatinine 1.7.  Hyperlipidemia - Atorvastatin 80 mg a day.  Prior DVT PE - Continuing with Eliquis lifelong.     Follow-up: 6 months.  Medication Adjustments/Labs and Tests Ordered: Current medicines are reviewed at length with the patient today.  Concerns  regarding medicines are outlined above.   Orders Placed This Encounter  Procedures   ECHOCARDIOGRAM COMPLETE   No orders of the defined types were placed in this encounter.  Patient Instructions  Medication Instructions:  The current medical regimen is effective;  continue present plan and medications.  *If you need a refill on your cardiac medications before your next appointment, please call your pharmacy*  Testing/Procedures: Your physician has requested that you have an echocardiogram. Echocardiography is a painless test that uses sound waves to create images of your heart. It provides your doctor with information about the size and shape of your heart and how well your heart's chambers and valves are working. This procedure takes approximately one hour. There are no restrictions for this procedure.  Follow-Up: At [facility], you and your health needs are our priority.  As part of our continuing mission to provide you with exceptional heart care, we have created designated Provider Care Teams.  These Care Teams include your primary Cardiologist (physician) and Advanced Practice Providers (APPs -  Physician Assistants and Nurse Practitioners) who all work together to provide you with the care you need, when you need it.  We recommend signing up for the patient portal called "MyChart".  Sign up information is provided on this After Visit Summary.  MyChart is used to connect with patients for Virtual Visits (Telemedicine).  Patients are able to view  lab/test results, encounter notes, upcoming appointments, etc.  Non-urgent messages can be sent to your provider as well.   To learn more about what you can do with MyChart, go to [email or web address].    Your next appointment:   6 months with PA/NP  The format for your next appointment:   In Person  Provider:  and 1 yr with Dr Marlou Porch      Important Information About Sugar         I,Rachel Rivera,acting as a scribe  for Candee Furbish, MD.,have documented all relevant documentation on the behalf of Candee Furbish, MD,as directed by  Candee Furbish, MD while in the presence of Candee Furbish, MD.  I, Candee Furbish, MD, have reviewed all documentation for this visit. The documentation on 03/26/22 for the exam, diagnosis, procedures, and orders are all accurate and complete.   Signed, Candee Furbish, MD  03/26/2022 3:39 PM    [place]

## 2022-04-01 ENCOUNTER — Ambulatory Visit (INDEPENDENT_AMBULATORY_CARE_PROVIDER_SITE_OTHER): Payer: Commercial Managed Care - PPO | Admitting: Family Medicine

## 2022-04-01 ENCOUNTER — Other Ambulatory Visit: Payer: Self-pay

## 2022-04-01 ENCOUNTER — Encounter (INDEPENDENT_AMBULATORY_CARE_PROVIDER_SITE_OTHER): Payer: Self-pay | Admitting: Family Medicine

## 2022-04-01 ENCOUNTER — Telehealth: Payer: Medicare HMO

## 2022-04-01 VITALS — BP 122/72 | HR 63 | Ht 71.0 in | Wt 249.4 lb

## 2022-04-01 DIAGNOSIS — M542 Cervicalgia: Secondary | ICD-10-CM

## 2022-04-01 DIAGNOSIS — R053 Chronic cough: Secondary | ICD-10-CM

## 2022-04-01 DIAGNOSIS — Z23 Encounter for immunization: Secondary | ICD-10-CM

## 2022-04-01 DIAGNOSIS — I4892 Unspecified atrial flutter: Secondary | ICD-10-CM

## 2022-04-01 DIAGNOSIS — E782 Mixed hyperlipidemia: Secondary | ICD-10-CM

## 2022-04-01 DIAGNOSIS — J449 Chronic obstructive pulmonary disease, unspecified: Secondary | ICD-10-CM

## 2022-04-01 DIAGNOSIS — I10 Essential (primary) hypertension: Secondary | ICD-10-CM

## 2022-04-01 MED ORDER — LOSARTAN 50 MG TABLET
50.0000 mg | ORAL_TABLET | Freq: Every day | ORAL | 3 refills | Status: DC
Start: 2022-04-01 — End: 2022-06-02

## 2022-04-01 NOTE — Progress Notes (Signed)
Thomas [name] is a 76 year old male who presents today for a six-month follow-up of hypertension and hyperlipidemia and chronic medical problems.  The patient reports current adherence to recommended diet is good.  He actually has lost a significant amount of weight since his last regular office visit.  Been more active and has been eating less but has been in and out of the hospital for several months.  Unfortunately she did just die last week.  The pt is compliant with medicine.  He has been complaining of a chronic cough describes it as a Dry cough for a few months.  He does not feel sick it just annoying to him.  No sputum, no blood. Not sob or wheeze.  He does have a history of mild COPD and is on Spiriva.  He does not feel that the Spiriva causing his cough.  He tried going off of the Spiriva and his shortness of breath got worse so he definitely thinks it is helping.  He was supposed to follow up with pulmonology in September but they missed the appointment was in the hospital.  He is had no recent imaging study of his lung.  He is a nonsmoker  Regarding HTN: The following symptoms are also present: none, and he also denies the following symptoms: headache, blurred vision, chest discomfort, shortness of breath and palpitations.  Side effects of medication:none.    Regarding lipid treatment: He is taking Zocor and is having no side effects.    Lab Results   Component Value Date    CHOLESTEROL 149 10/28/2021    HDLCHOL 50 10/28/2021    LDLCHOL 87 10/28/2021    TRIG 58 10/28/2021      He does have a history of atrial flutter and has follow-up with cardiology. He continues on his Eliquis as his anticoagulant and a calcium channel blocker for rate control.  He also continues on flecainide.  He denies having any bleeding.  He denies any palpitations, lightheadedness, dizziness or chest pain.   He is also been complaining of pain in the back of his neck.  He has not had any recent imaging studies of his neck.  We  do know degenerative disc disease in his lumbar spine but more recently he has been complaining of his neck.  He states it does not radiate down his shoulder or arms.  No numbness or tingling in his hands  Most recent Cologuard 10/31/2020, negative    REVIEW OF SYSTEMS:  GENERAL: negative for fevers/chills, fatigue, significant weight change, sleep disturbance  HEENT:denies visual changes, sore throat or URI symptoms  RESPIRATORY:  Positive for dry nonproductive cough as above in history present illness  CARDIAC:  Negative for chest pain or palpitations  GI: no nausea/vomitting/diarrhea, no abdominal pain  MUSCULOSKELETAL: no myalgias or arthragias, no recent trauma, neck pain as above  NEUROLOGIC: no headache, visual changes or mental status changes  GU: no urinary symptoms or CVA tenderness      Allergies   Allergen Reactions    Flomax [Tamsulosin] Nausea/ Vomiting     Current Outpatient Medications   Medication Sig    acetaminophen (TYLENOL) 500 mg Oral Tablet Take 1 Tablet (500 mg total) by mouth Every 4 hours as needed for Pain (pt takes tylenol pm)    apixaban (ELIQUIS) 5 mg Oral Tablet Take 1 Tablet (5 mg total) by mouth Twice daily    diltiazem HCl (TIAZAC) 240 mg Oral Capsule,Sustained Action 24 hr TAKE ONE CAPSULE BY MOUTH EVERY  DAY    flecainide (TAMBOCOR) 100 mg Oral Tablet TAKE ONE TABLET BY MOUTH TWICE DAILY    furosemide (LASIX) 40 mg Oral Tablet TAKE ONE TABLET BY MOUTH EVERY DAY    losartan (COZAAR) 50 mg Oral Tablet Take 1 Tablet (50 mg total) by mouth Once a day    nitroGLYCERIN (NITROSTAT) 0.4 mg Sublingual Tablet, Sublingual Place 1 Tablet (0.4 mg total) under the tongue Every 5 minutes as needed for Chest pain    potassium chloride (KLOR-CON) 10 mEq Oral Tablet Sustained Release TAKE ONE TABLET BY MOUTH EVERY DAY    simvastatin (ZOCOR) 20 mg Oral Tablet Take 1 Tablet (20 mg total) by mouth Every night    SPIRIVA WITH HANDIHALER 18 mcg Inhalation Capsule, w/Inhalation Device Take 1 Capsule (18  mcg total) by inhalation Once a day     Past Medical History:   Diagnosis Date    Arthritis     Dysuria     Enteritis due to Rotavirus     Gout     Headache     Hemorrhoid     Hypercholesterolemia     Hypertension          Past Surgical History:   Procedure Laterality Date    HX HEMORRHOIDECTOMY           Social History     Tobacco Use    Smoking status: Former     Types: Cigars    Smokeless tobacco: Former     Types: Chew, Snuff   Substance Use Topics    Alcohol use: Yes     Comment: occasional        PHYSICAL EXAM:   The patient appears to be in no acute distress.  Vitals: BP 122/72   Pulse 63   Ht 1.803 m (5\' 11" )   Wt 113 kg (249 lb 6 oz)   SpO2 95%   BMI 34.78 kg/m      Respiratory: clear to ascultation B/L, no respiratory distress, equal BS throughout, no bibasilar rales  Heart: normal sinus rhythm today, no murmur, no edema, normal peripheral pulses  Abdomen: soft, nontender, positive bowel sounds  Extremities: no edema, no calf pain or tenderness  Neuro: AAOx3, cranial nerves's intact, DTR's 2/4 throughout  Skin: warm and dry, no rashes or lesions    ASSESSMENT:     ICD-10-CM    1. Paroxysmal atrial flutter (CMS HCC)  I48.92 Continue flecainide and Eliquis as his anticoagulant.  Heart rate is under good control.  Continue following up with Cardiology.  He is asymptomatic currently.      2. Chronic cough  R05.3 XR CHEST AP AND LATERAL [IMG813]-I do suspect this is secondary ACE-inhibitor so we are going to stop due in angiotensin receptor blocker.  Also order a chest x-ray he had no bruising study.  Going to make sure he follows up with his pulmonologist as well      3. Bilateral posterior neck pain  M54.2 XR CERVICAL SPINE 2 OR 3 VW [IMG56]-I recommended Tylenol, moist heat and massage.  I did send him for a chest x-ray and physical therapy as a possible treatment pending results of his film      4. Chronic obstructive pulmonary disease, unspecified COPD type (CMS HCC)  J44.9 Refer to [facility],  [facility]-continue Spiriva and p.r.n. use of his rescue medication.  Will reschedule his appointment with pulmonology      5. Essential hypertension  I10 ALT (SGPT)-his blood pressure is well  controlled.  He did bring in his list of readings at home were a couple lower numbers but he denied any orthostasis.  He has lost about 20 lb since his visit so his antihypertensive need may not be as great.  He is also cough from his ACE-inhibitor so will switch him to losartan 50 mg.  Follow-up here in 2 months continue to monitor blood pressure at home     AST (SGOT)     LIPID PANEL     BASIC METABOLIC PANEL, FASTING     CBC/DIFF      6. Mixed hyperlipidemia  E78.2 ALT (SGPT)-will check fasting lipids prior to next office visit.  Continue statin in the meantime     AST (SGOT)     LIPID PANEL         PLAN:   I reviewed appropriate screenings and HCM with patient.  Was given today.  I did recommend he get the COVID vaccination at the local pharmacy.  Will do Medicare wellness when he follows up in 2 months  Orders Placed This Encounter    XR CHEST AP AND LATERAL [IMG813]    XR CERVICAL SPINE 2 OR 3 VW [IMG56]    Flu Vaccine, 65+,0.5 mL IM (Admin)    ALT (SGPT)    AST (SGOT)    LIPID PANEL    BASIC METABOLIC PANEL, FASTING    CBC/DIFF    Refer to [facility], [facility]    losartan (COZAAR) 50 mg Oral Tablet     Medication: continue current medication regimen unchanged  Return For medical wellness and blood pressure follow-up.  Blood work 1st.

## 2022-04-01 NOTE — Nursing Note (Signed)
Pt here for F>U

## 2022-04-01 NOTE — Nursing Note (Signed)
Immunization administered       Name Date Dose VIS Date Route    Influenza Vaccine, 65+ 04/01/2022 0.5 mL 01/19/2020 Intramuscular    Site: Left deltoid    Given By: Rosebud Koenen, MA    Manufacturer: [employer]    Lot: [id number]    Comment: MA    NDC: [id number]

## 2022-04-06 ENCOUNTER — Telehealth (HOSPITAL_COMMUNITY): Payer: Self-pay | Admitting: Student in an Organized Health Care Education/Training Program

## 2022-04-07 DIAGNOSIS — M1711 Unilateral primary osteoarthritis, right knee: Secondary | ICD-10-CM | POA: Diagnosis not present

## 2022-04-13 ENCOUNTER — Other Ambulatory Visit: Payer: Self-pay

## 2022-04-13 ENCOUNTER — Ambulatory Visit
Payer: Commercial Managed Care - PPO | Attending: Student in an Organized Health Care Education/Training Program | Admitting: Student in an Organized Health Care Education/Training Program

## 2022-04-13 ENCOUNTER — Encounter (HOSPITAL_COMMUNITY): Payer: Self-pay | Admitting: Student in an Organized Health Care Education/Training Program

## 2022-04-13 DIAGNOSIS — J449 Chronic obstructive pulmonary disease, unspecified: Secondary | ICD-10-CM | POA: Insufficient documentation

## 2022-04-13 MED ORDER — SPIRIVA WITH HANDIHALER 18 MCG AND INHALATION CAPSULES
18.0000 ug | ORAL_CAPSULE | Freq: Every day | RESPIRATORY_TRACT | 5 refills | Status: DC
Start: 2022-04-13 — End: 2022-12-02

## 2022-04-13 NOTE — Progress Notes (Signed)
PULMONARY OFFICE VISIT    Thomas [name], Thomas [name], 76 y.o. male  Date of service: 11/11/2021  Date of Birth:  09/06/1945    IMPRESSION:  Mild Chronic obstructive pulmonary disease gold stage a FEV1 84% predicted  Elevated left hemidiaphragm  Suspected sleep apnea  Former smoker  Secondhand Smoke exposure  Obesity  Atrial fibrillation/flutter with history of cardioversion  Hypertension  Left bundle branch block    RECOMMENDATIONS:  Continue Spiriva  Prior testing from 2020 shows normal diaphragmatic function  Patient defers sleep apnea testing   Encouraged patient to continue weight loss, given beneficial effect on his or all health and is breathing. Offered weight loss clinic referral but patient wishes to continue to lose weight on his own.  Advised to avoid driving or operative heavy machinery when sleepy.  Return to clinic in 5 months  Orders Placed This Encounter    SPIRIVA WITH HANDIHALER 18 mcg Inhalation Capsule, w/Inhalation Device      Problem List Items Addressed This Visit    None  Visit Diagnoses       Chronic obstructive pulmonary disease, unspecified COPD type (CMS HCC)              CHIEF COMPLAINT:  Shortness of breath    HISTORY OF PRESENT ILLNESS:    Thomas [name] is a 76 y.o. male with PMH of COPD COLD stage B, former smoker, questionable elevated left diaphragm, obesity, hypertension, atrial fibrillation/flutter with history of cardioversion, left bundle branch block.      From my prior note 01/2021:  Patient is here to establish care.  He was diagnosed with COPD 2 years ago and was started on Anoro.  He states that he has been doing pretty well since he has been on air Anoro he.  He noted significant improvement in his shortness of breath.  He does not need albuterol at home.  He has not had any ED visits or hospitalizations.  He has not had any prednisone or antibiotics to use in the last 2 years.  He is able to walk on flat ground without any limitation in his activity, however he is only able to go  up 3 flights of stairs before he gets short of breath.  Patient is High risk for OSA given the history of hypertension, elevated BMI, symptoms of snoring, occasional morning headaches, and Epworth Sleepiness Scale of 10.   Per review of prior records from Care everywhere patient saw Dr. Hadley Pen from pulmonary in [facility] on 04/05/2019 at which time he was planned to be evaluated with stiff testing, sleep apnea testing and pulmonary function testing.  The testing was not completed at that time.  11/11/21  Patient reports that his breathing has gotten worse but he resumed taking Spiriva and since then he has been feeling better.  He also notes that his blood pressure is decreased since he resumed Spiriva use.  He does not report any other complaints.  He has been doing well with exertion.  We discussed at length the high risk of having sleep apnea and the benefits of treating it.  The patient at this time defers testing as he does not want to be bound to wearing a CPAP while going to sleep.  We also discussed alternative therapies if he is found to have sleep apnea however at this time he wishes not to pursue further testing.  Symptoms:  Shortness of breath on exertion  SABA use: none  Triggers:  Exertion.  No known allergic triggers  Intubations:  None  Exacerbations in the last year:  None  Prednisone use in the last year:  None  Antibiotic use in the last year:  None  Disease severity:  COPD gold stage A  Tobacco:  Patient has been smoking cigars and pipes from age 12 to age 89.   Occupation: Engineer, water   Exposures: (asbestos, coal, silica, mold): none  Travel: none  Pets/birds: none  Family history of lung disorder: none   Family history of lung cancer: none  Brother has OSA    Today 04/13/22 Reports no new respiratory complaints. Has been breathing better since he last 20-30lb over the last few months for walking more. He does not report and wheezing coughing chest discomfort or  worsening dyspnea. EWSS today is 3. He recently lost his significant other, and is working on fixing up his old house that he used to live in 5 years ago, with the help of his daughter. He is hoping to lose a few more pounds so and hopes it will help him breath better so he no longer needs to use Spiriva.    Past Medical History:   Diagnosis Date    Arthritis     Dysuria     Enteritis due to Rotavirus     Gout     Headache     Hemorrhoid     Hypercholesterolemia     Hypertension      Past Surgical History:   Procedure Laterality Date    HX HEMORRHOIDECTOMY        Cannot display prior to admission medications because the patient has not been admitted in this contact.          No current facility-administered medications for this visit.    Allergies   Allergen Reactions    Flomax [Tamsulosin] Nausea/ Vomiting     Social History     Tobacco Use    Smoking status: Former     Types: Cigars    Smokeless tobacco: Former     Types: Chew, Snuff   Substance Use Topics    Alcohol use: Yes     Comment: occasional     Family History:  relevant history summarized above in the HPI.   No other family history of pulmonary significance.     ROS:   Other than ROS in the HPI, all other systems were negative.    EXAM:  Temperature: (!) 35.5 C (95.9 F)  Heart Rate: (!) 44  BP (Non-Invasive): (!) 152/80  Respiratory Rate: 17  SpO2: 96 %  General: Seated, in no acute distress  HEENT: Atraumatic normocephalic.   Neck: Trachea in the midline. Supple.   Cardiovascular: S1-S2 regular. No murmur.  Lungs: Good air entry bilaterally. No rales rhonchi or wheezing.  Extremities: No edema clubbing or cyanosis.  Neurological: Awake, alert , oriented. Nonfocal.  Psychiatric: Normal mood and affect.      Labs:    No results found for this or any previous visit (from the past 24 hour(s)).   Imaging Studies:      CXR:  None on file    CT CHEST:  CTA CHEST 01/26/2018, report on care everywhere, no images available for my personal interpretation and  review  COMPARISON:  CTA chest 12/21/2017.   FINDINGS:   No evidence of acute pulmonary embolism.  No thoracic aortic aneurysm or   dissection.   The heart is normal in size without pericardial effusion.  No pathologically  enlarged lymph nodes in the chest.   Elevation of the left hemidiaphragm with likely left basilar atelectasis.  No   pneumothorax, pleural effusion, or consolidation.  Central airways are patent.   Nonobstructing left nephrolithiasis.  Upper abdomen otherwise demonstrates no   acute pathology.   Sternum is intact.  Thoracic vertebral body heights are preserved.  No acute   displaced rib fracture identified.  No suspicious bony lesions.   IMPRESSION:   1.  No evidence of acute pulmonary embolism.   2.  Elevation of the left hemidiaphragm with likely left basilar atelectasis.  No pneumothorax, pleural effusion, or consolidation.   3.  Nonobstructing left nephrolithiasis.     PULMONARY FUNCTION TESTING  03/13/2021  RESULTS:  FEV1/FVC ratio is normal at 71%.  FEV1 is normal at 84% of predicted.  FVC is normal at 88% predicted.  Following albuterol, there is no significant improvement in spirometry.    Total lung capacity is normal at 116% of predicted.  Residual volume is increased at 155% of predicted.  Slow vital capacity is normal at 94% of predicted.  Diffusion capacity is reduced at 67% of predicted.  Flow volume loop is obstructed in appearance.  IMPRESSION:  Normal spirometry.    No significant improvement in spirometry following albuterol   Air trapping as indicated by increased residual volume 155% predicted.  Decreased diffusion capacity at 67% predicted, with no significant correction when considering alveolar volume   Though spirometry component is normal, the patient's lung volumes indicate air trapping, diffusion capacity is decreased and flow volume lopo is obstructed appearance.  This pattern suggests an underlying obstructive pulmonary limitation.  Further clinical and radiographic  correlation is advised.    Spirometry 01/28/2018      Data:   Spirometry: The FVC is 3.6 L or 81% predicted which is normal.  The FEV1 is 2.36 L or 71% predicted which is mildly reduced.  The FEV1 to FVC ratio is 0.66 which is low.  MVV is 82 L/min which is what is expected for this FEV1.   Interpretation: There is mild airflow obstruction.  There is no previous spirometry to compare this to.     Total time spent on encounter was 32 minutes. In addition to face to face time with the patient performing history and exam, this includes time spent before and after the visit reviewing records, personally reviewing prior imaging, reviewing prior cardiac studies as available, reviewing prior outpatient notes as available, reviewing the patient's medication record, coordinating with office staff and time spent performing final documentation in the medical record.    Stefan Church, MD

## 2022-04-20 ENCOUNTER — Encounter (INDEPENDENT_AMBULATORY_CARE_PROVIDER_SITE_OTHER): Payer: Self-pay | Admitting: Physician Assistant

## 2022-04-20 ENCOUNTER — Other Ambulatory Visit: Payer: Self-pay

## 2022-04-20 ENCOUNTER — Ambulatory Visit: Payer: Commercial Managed Care - PPO | Attending: Physician Assistant | Admitting: Physician Assistant

## 2022-04-20 VITALS — Resp 18 | Ht 71.0 in | Wt 240.0 lb

## 2022-04-20 DIAGNOSIS — Z125 Encounter for screening for malignant neoplasm of prostate: Secondary | ICD-10-CM | POA: Insufficient documentation

## 2022-04-20 DIAGNOSIS — M545 Low back pain, unspecified: Secondary | ICD-10-CM | POA: Insufficient documentation

## 2022-04-20 DIAGNOSIS — N138 Other obstructive and reflux uropathy: Secondary | ICD-10-CM | POA: Insufficient documentation

## 2022-04-20 DIAGNOSIS — R3912 Poor urinary stream: Secondary | ICD-10-CM

## 2022-04-20 DIAGNOSIS — Z87891 Personal history of nicotine dependence: Secondary | ICD-10-CM

## 2022-04-20 DIAGNOSIS — N401 Enlarged prostate with lower urinary tract symptoms: Secondary | ICD-10-CM | POA: Insufficient documentation

## 2022-04-20 DIAGNOSIS — R361 Hematospermia: Secondary | ICD-10-CM | POA: Insufficient documentation

## 2022-04-20 DIAGNOSIS — Z87438 Personal history of other diseases of male genital organs: Secondary | ICD-10-CM

## 2022-04-20 LAB — PSA, DIAGNOSTIC: PSA: 1.66 ng/mL (ref <=4.00)

## 2022-04-20 MED ORDER — TAMSULOSIN 0.4 MG CAPSULE
0.4000 mg | ORAL_CAPSULE | Freq: Every evening | ORAL | 0 refills | Status: DC
Start: 2022-04-20 — End: 2022-09-22

## 2022-04-20 NOTE — Progress Notes (Signed)
[place]    PATIENT NAME :Thomas [name]   DATE OF SERVICE: 04/20/2022  SERVICE LOCATION: [facility]      Thomas [name] is a 76 y.o. male who presents for followup of weakened stream and hesitancy with hematospermia.    BPH  Patient was previously on Flomax for urinary complaints. He discontinued as he felt that this was causing his dizziness and nausea. He reports that he started it approximately 1 week ago and has been doing well on it. He does notice improvement in his symptoms but continues to have a weaker stream with intermittency. He denies hesitancy, urgency and leaking that are bothersome. He does mention lower right sided back pain over the last month.     Hematospermia  Patient previously was having infrequent blood in his ejaculate. He denies recurrence. He did not complete cytology. He denies family history of GU cancer. He is an infrequent smoker quit around age 47.       AUA Symptom Score performed 04/20/2022  Incomplete emptying = 1  Frequency = 1  Intermittency = 5  Urgency = 2  Weak stream = 1  Straining = 0  Nocturia = 1    Total = 11  Quality of Life  3    Past Medical History  Past Medical History:   Diagnosis Date    Arthritis     Dysuria     Enteritis due to Rotavirus     Gout     Headache     Hemorrhoid     Hypercholesterolemia     Hypertension            Past Surgical History  Past Surgical History:   Procedure Laterality Date    HX HEMORRHOIDECTOMY             Allergies  Allergies   Allergen Reactions    Flomax [Tamsulosin] Nausea/ Vomiting       Medications  acetaminophen (TYLENOL) 500 mg Oral Tablet, Take 1 Tablet (500 mg total) by mouth Every 4 hours as needed for Pain (pt takes tylenol pm)  apixaban (ELIQUIS) 5 mg Oral Tablet, Take 1 Tablet (5 mg total) by mouth Twice daily  diltiazem HCl (TIAZAC) 240 mg Oral Capsule,Sustained Action 24 hr, TAKE ONE CAPSULE BY MOUTH EVERY DAY  flecainide (TAMBOCOR) 100 mg Oral Tablet, TAKE  ONE TABLET BY MOUTH TWICE DAILY  furosemide (LASIX) 40 mg Oral Tablet, TAKE ONE TABLET BY MOUTH EVERY DAY  losartan (COZAAR) 50 mg Oral Tablet, Take 1 Tablet (50 mg total) by mouth Once a day  nitroGLYCERIN (NITROSTAT) 0.4 mg Sublingual Tablet, Sublingual, Place 1 Tablet (0.4 mg total) under the tongue Every 5 minutes as needed for Chest pain  potassium chloride (KLOR-CON) 10 mEq Oral Tablet Sustained Release, TAKE ONE TABLET BY MOUTH EVERY DAY  simvastatin (ZOCOR) 20 mg Oral Tablet, Take 1 Tablet (20 mg total) by mouth Every night  SPIRIVA WITH HANDIHALER 18 mcg Inhalation Capsule, w/Inhalation Device, Take 1 Capsule (18 mcg total) by inhalation Once a day    No facility-administered medications prior to visit.      OBJECTIVE:  Resp 18   Ht 1.803 m (5\' 11" )   Wt 109 kg (240 lb)   BMI 33.47 kg/m       General: No apparent distress, well appearing  HEENT:  NCAT, EOMI, OP clear  Neck:  supple, trachea midline  RESP:  Nonlabored breathing, no use of  accessory muscles   CV: No extremity swelling  Neuro:  Neurological exam is consistent with patients age.     DRE: 40 g prostate without nodules or induration  Psych: alert, appropriate mood, and in no acute distress.      MSK: Ambulates without assistance      Office Testing/Procedures:  Nursing Notes:   Dimas [place], [place]  04/20/22 1312  Signed     04/20/22 1300   Urine test  (Siemens Multistix 10 SG)   PVR Volume 73ml            PSA:  05/2016 - 1.30  06/2017 - 2.60  04/2021 - 2.22    I personally reviewed the following:    Labs - PSA  Imaging - none  Records - pt previous office records      ASSESSMENT:   Encounter Diagnoses   Name Primary?    Benign prostatic hyperplasia with urinary obstruction Yes    Encounter for prostate cancer screening          PLAN: 76 year old male with history of BPH w/ LUTS and hematospermia    PVR 20cc, ua/cx now for lower back pain  Continue Flomax, will reassess in a few months, if persistent symptoms, consider Flomax vs.  procedure  PSA now  Recommended limiting prostate irritants to help with urinary sx  Medication management: flomax  All questions and concerns were addressed during this visit.       Orders Placed This Encounter    URINE CULTURE    URINALYSIS, MACROSCOPIC AND MICROSCOPIC    PSA, DIAGNOSTIC    POCT PVR    tamsulosin (FLOMAX) 0.4 mg Oral Capsule     Return in about 2 months (around 06/20/2022) for In Person Visit, PVR.      Chong Sicilian, PA-C  [facility] [place] [facility]

## 2022-04-20 NOTE — Nursing Note (Signed)
04/20/22 1300   Urine test  (Siemens Multistix 10 SG)   PVR Volume 52ml

## 2022-05-01 ENCOUNTER — Emergency Department (HOSPITAL_COMMUNITY): Payer: Medicare HMO

## 2022-05-01 ENCOUNTER — Emergency Department (HOSPITAL_COMMUNITY)
Admission: EM | Admit: 2022-05-01 | Discharge: 2022-05-01 | Disposition: A | Discharge status: Home / Self Care | Payer: Medicare HMO | Attending: Emergency Medicine | Admitting: Emergency Medicine

## 2022-05-01 ENCOUNTER — Inpatient Hospital Stay
Admission: RE | Admit: 2022-05-01 | Discharge: 2022-05-01 | Disposition: A | Discharge status: Home / Self Care | Payer: Commercial Managed Care - PPO | Source: Ambulatory Visit | Attending: Family Medicine | Admitting: Family Medicine

## 2022-05-01 ENCOUNTER — Other Ambulatory Visit (HOSPITAL_COMMUNITY): Payer: Commercial Managed Care - PPO

## 2022-05-01 ENCOUNTER — Other Ambulatory Visit: Payer: Self-pay

## 2022-05-01 ENCOUNTER — Inpatient Hospital Stay (HOSPITAL_COMMUNITY)
Admission: RE | Admit: 2022-05-01 | Discharge: 2022-05-01 | Disposition: A | Discharge status: Home / Self Care | Payer: Commercial Managed Care - PPO | Source: Ambulatory Visit | Attending: Family Medicine | Admitting: Family Medicine

## 2022-05-01 ENCOUNTER — Encounter (HOSPITAL_COMMUNITY): Payer: Self-pay

## 2022-05-01 DIAGNOSIS — N183 Chronic kidney disease, stage 3 unspecified: Secondary | ICD-10-CM | POA: Insufficient documentation

## 2022-05-01 DIAGNOSIS — W19XXXA Unspecified fall, initial encounter: Secondary | ICD-10-CM | POA: Insufficient documentation

## 2022-05-01 DIAGNOSIS — E1122 Type 2 diabetes mellitus with diabetic chronic kidney disease: Secondary | ICD-10-CM | POA: Insufficient documentation

## 2022-05-01 DIAGNOSIS — Z1152 Encounter for screening for COVID-19: Secondary | ICD-10-CM | POA: Insufficient documentation

## 2022-05-01 DIAGNOSIS — Z7901 Long term (current) use of anticoagulants: Secondary | ICD-10-CM | POA: Diagnosis not present

## 2022-05-01 DIAGNOSIS — R600 Localized edema: Secondary | ICD-10-CM | POA: Diagnosis not present

## 2022-05-01 DIAGNOSIS — G319 Degenerative disease of nervous system, unspecified: Secondary | ICD-10-CM | POA: Diagnosis not present

## 2022-05-01 DIAGNOSIS — S0990XA Unspecified injury of head, initial encounter: Secondary | ICD-10-CM | POA: Diagnosis not present

## 2022-05-01 DIAGNOSIS — Y92002 Bathroom of unspecified non-institutional (private) residence single-family (private) house as the place of occurrence of the external cause: Secondary | ICD-10-CM | POA: Diagnosis not present

## 2022-05-01 DIAGNOSIS — R4182 Altered mental status, unspecified: Secondary | ICD-10-CM | POA: Insufficient documentation

## 2022-05-01 DIAGNOSIS — I129 Hypertensive chronic kidney disease with stage 1 through stage 4 chronic kidney disease, or unspecified chronic kidney disease: Secondary | ICD-10-CM | POA: Diagnosis not present

## 2022-05-01 DIAGNOSIS — S6992XA Unspecified injury of left wrist, hand and finger(s), initial encounter: Secondary | ICD-10-CM | POA: Diagnosis not present

## 2022-05-01 DIAGNOSIS — Z043 Encounter for examination and observation following other accident: Secondary | ICD-10-CM | POA: Diagnosis not present

## 2022-05-01 DIAGNOSIS — Z79899 Other long term (current) drug therapy: Secondary | ICD-10-CM | POA: Insufficient documentation

## 2022-05-01 DIAGNOSIS — I10 Essential (primary) hypertension: Secondary | ICD-10-CM

## 2022-05-01 DIAGNOSIS — N401 Enlarged prostate with lower urinary tract symptoms: Secondary | ICD-10-CM | POA: Insufficient documentation

## 2022-05-01 DIAGNOSIS — I4892 Unspecified atrial flutter: Secondary | ICD-10-CM | POA: Insufficient documentation

## 2022-05-01 DIAGNOSIS — M542 Cervicalgia: Secondary | ICD-10-CM | POA: Insufficient documentation

## 2022-05-01 DIAGNOSIS — R361 Hematospermia: Secondary | ICD-10-CM | POA: Insufficient documentation

## 2022-05-01 DIAGNOSIS — N138 Other obstructive and reflux uropathy: Secondary | ICD-10-CM | POA: Insufficient documentation

## 2022-05-01 DIAGNOSIS — R053 Chronic cough: Secondary | ICD-10-CM | POA: Insufficient documentation

## 2022-05-01 DIAGNOSIS — E782 Mixed hyperlipidemia: Secondary | ICD-10-CM

## 2022-05-01 DIAGNOSIS — I480 Paroxysmal atrial fibrillation: Secondary | ICD-10-CM | POA: Insufficient documentation

## 2022-05-01 LAB — COMPREHENSIVE METABOLIC PANEL
ALT: 16 U/L (ref 0–44)
AST: 19 U/L (ref 15–41)
Albumin: 3.8 g/dL (ref 3.5–5.0)
Alkaline Phosphatase: 96 U/L (ref 38–126)
Anion gap: 7 (ref 5–15)
BUN: 17 mg/dL (ref 8–23)
CO2: 27 mmol/L (ref 22–32)
Calcium: 8.9 mg/dL (ref 8.9–10.3)
Chloride: 101 mmol/L (ref 98–111)
Creatinine, Ser: 1.33 mg/dL — ABNORMAL HIGH (ref 0.61–1.24)
GFR, Estimated: 55 mL/min — ABNORMAL LOW (ref >60)
Glucose, Bld: 96 mg/dL (ref 70–99)
Potassium: 3.9 mmol/L (ref 3.5–5.1)
Sodium: 135 mmol/L (ref 135–145)
Total Bilirubin: 1.4 mg/dL — ABNORMAL HIGH (ref 0.3–1.2)
Total Protein: 6.9 g/dL (ref 6.5–8.1)

## 2022-05-01 LAB — URINALYSIS, ROUTINE W REFLEX MICROSCOPIC
Bilirubin Urine: NEGATIVE
Glucose, UA: NEGATIVE mg/dL
Ketones, ur: 5 mg/dL — AB
Leukocytes,Ua: NEGATIVE
Nitrite: NEGATIVE
Protein, ur: >=300 mg/dL — AB
Specific Gravity, Urine: 1.014 (ref 1.005–1.030)
pH: 7 (ref 5.0–8.0)

## 2022-05-01 LAB — CK: Total CK: 48 U/L — ABNORMAL LOW (ref 49–397)

## 2022-05-01 LAB — CBC WITH DIFFERENTIAL/PLATELET
Abs Immature Granulocytes: 0.02 10*3/uL (ref 0.00–0.07)
Basophils Absolute: 0 10*3/uL (ref 0.0–0.1)
Basophils Relative: 0 %
Eosinophils Absolute: 0.1 10*3/uL (ref 0.0–0.5)
Eosinophils Relative: 1 %
HCT: 47.1 % (ref 39.0–52.0)
Hemoglobin: 15 g/dL (ref 13.0–17.0)
Immature Granulocytes: 0 %
Lymphocytes Relative: 16 %
Lymphs Abs: 1.1 10*3/uL (ref 0.7–4.0)
MCH: 27.8 pg (ref 26.0–34.0)
MCHC: 31.8 g/dL (ref 30.0–36.0)
MCV: 87.4 fL (ref 80.0–100.0)
Monocytes Absolute: 0.8 10*3/uL (ref 0.1–1.0)
Monocytes Relative: 11 %
Neutro Abs: 4.7 10*3/uL (ref 1.7–7.7)
Neutrophils Relative %: 72 %
Platelets: 141 10*3/uL — ABNORMAL LOW (ref 150–400)
RBC: 5.39 MIL/uL (ref 4.22–5.81)
RDW: 14.9 % (ref 11.5–15.5)
WBC: 6.7 10*3/uL (ref 4.0–10.5)
nRBC: 0 % (ref 0.0–0.2)

## 2022-05-01 LAB — CBG MONITORING, ED: Glucose-Capillary: 91 mg/dL (ref 70–99)

## 2022-05-01 LAB — RESP PANEL BY RT-PCR (FLU A&B, COVID) ARPGX2
Influenza A by PCR: NEGATIVE
Influenza B by PCR: NEGATIVE
SARS Coronavirus 2 by RT PCR: NEGATIVE

## 2022-05-01 LAB — TROPONIN I (HIGH SENSITIVITY)
Troponin I (High Sensitivity): 10 ng/L (ref <18)
Troponin I (High Sensitivity): 9 ng/L (ref <18)

## 2022-05-01 LAB — AMMONIA: Ammonia: 27 umol/L (ref 9–35)

## 2022-05-01 LAB — CBC WITH DIFF
BASOPHIL #: <0.1 10*3/uL (ref <=0.20)
BASOPHIL %: 1 %
EOSINOPHIL #: 0.17 10*3/uL (ref <=0.50)
EOSINOPHIL %: 3 %
HCT: 45.7 % (ref 38.9–52.0)
HGB: 15 g/dL (ref 13.4–17.5)
IMMATURE GRANULOCYTE #: <0.1 10*3/uL (ref <0.10)
IMMATURE GRANULOCYTE %: 0 % (ref 0–1)
LYMPHOCYTE #: 1.84 10*3/uL (ref 1.00–4.80)
LYMPHOCYTE %: 31 %
MCH: 31.2 pg (ref 26.0–32.0)
MCHC: 32.8 g/dL (ref 31.0–35.5)
MCV: 95 fL (ref 78.0–100.0)
MONOCYTE #: 0.49 10*3/uL (ref 0.20–1.10)
MONOCYTE %: 8 %
MPV: 10.6 fL (ref 8.7–12.5)
NEUTROPHIL #: 3.33 10*3/uL (ref 1.50–7.70)
NEUTROPHIL %: 57 %
PLATELETS: 174 10*3/uL (ref 150–400)
RBC: 4.81 10*6/uL (ref 4.50–6.10)
RDW-CV: 12.9 % (ref 11.5–15.5)
WBC: 5.9 10*3/uL (ref 3.7–11.0)

## 2022-05-01 LAB — BASIC METABOLIC PANEL, FASTING
ANION GAP: 9 mmol/L (ref 4–13)
BUN/CREA RATIO: 13 (ref 6–22)
BUN: 13 mg/dL (ref 8–25)
CALCIUM: 8.8 mg/dL (ref 8.6–10.3)
CHLORIDE: 102 mmol/L (ref 96–111)
CO2 TOTAL: 30 mmol/L (ref 23–31)
CREATININE: 1.02 mg/dL (ref 0.75–1.35)
ESTIMATED GFR - MALE: 76 mL/min/BSA (ref >=60)
GLUCOSE: 89 mg/dL (ref 70–99)
POTASSIUM: 3.6 mmol/L (ref 3.5–5.1)
SODIUM: 141 mmol/L (ref 136–145)

## 2022-05-01 LAB — LIPID PANEL
CHOL/HDL RATIO: 2.6
CHOLESTEROL: 137 mg/dL (ref 100–200)
HDL CHOL: 53 mg/dL (ref >=50)
LDL CALC: 70 mg/dL (ref <100)
NON-HDL: 84 mg/dL (ref <=190)
TRIGLYCERIDES: 70 mg/dL (ref <150)
VLDL CALC: 10 mg/dL (ref <30)

## 2022-05-01 LAB — URINALYSIS, MACROSCOPIC
BILIRUBIN: NEGATIVE mg/dL
BLOOD: NEGATIVE mg/dL
GLUCOSE: NEGATIVE mg/dL
KETONES: NEGATIVE mg/dL
LEUKOCYTES: NEGATIVE WBCs/uL
NITRITE: NEGATIVE
PH: 8 (ref 5.0–9.0)
PROTEIN: NEGATIVE mg/dL
SPECIFIC GRAVITY: 1.006 (ref 1.001–1.030)
UROBILINOGEN: 0.2 mg/dL (ref 0.2–1.0)

## 2022-05-01 LAB — AST (SGOT): AST (SGOT): 14 U/L (ref 8–45)

## 2022-05-01 LAB — URINALYSIS, MICROSCOPIC: BACTERIA: NEGATIVE /hpf

## 2022-05-01 LAB — THYROID STIMULATING HORMONE (SENSITIVE TSH): TSH: 1.665 u[IU]/mL (ref 0.350–4.940)

## 2022-05-01 LAB — ALT (SGPT): ALT (SGPT): 11 U/L (ref 10–55)

## 2022-05-01 NOTE — ED Notes (Signed)
Patient transported to CT 

## 2022-05-01 NOTE — Discharge Instructions (Signed)
You were seen in the emergency department today for a fall and confusion. Your labs here are normal and the CT of your head and neck are normal as well. We discussed you being admitted and observed which you declined. Please return immediately for any worsening symptoms and please follow-up with your primary care. I am also referring you to a neurologist for your confusion. Please call them today to ensure a follow-up appointment.

## 2022-05-01 NOTE — ED Provider Notes (Signed)
[facility] DEPT Provider Note   CSN: [id number] Arrival date & time: 05/01/22  1145     History  Chief Complaint  Patient presents with   Tanner [name] is a 76 y.o. male.  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  Presents with son who helps to provide some of the history.  Level 5 caveat: mental status change  Son states that this morning he checked on his dad who was in the living room sitting on the couch.  He states that at some point last night he fell.  States that the patient states he fell in the bathroom but he found his cane in the kitchen and the furniture somewhat disheveled in the living room.  He states that he is confused and not at his baseline mental status.  States that 2 days ago he received his COVID and flu vaccine at the same time at the [place].  He denies him having any recent illnesses.  States that he is a diabetic but has not had any sugar issues.  Does state that he has been complaining about being cold and noted that he had redness to his left middle finger.  The patient is unable to really provide any history.  He states that he did fall.  Unsure if he struck his head.  He is unsure how long he was on the floor for.  He is complaining of pain to the left middle finger.  He denies any chest pain or abdominal pain or neck pain.  Denies any dysuria, cough, fever.   Fall       Home Medications Prior to Admission medications   Medication Sig Start Date End Date Taking? Authorizing Provider  acetaminophen (TYLENOL) 500 MG tablet Take 1,000 mg by mouth every 6 (six) hours as needed for moderate pain. 02/21/13   Norins, Heinz Knuckles, MD  acidophilus (RISAQUAD) CAPS capsule Take 1 capsule by mouth daily.    [provider]  apixaban (ELIQUIS) 5 MG TABS tablet Take 5 mg by mouth 2 (two) times  daily.    [provider]  atenolol (TENORMIN) 25 MG tablet Take 25 mg by mouth daily.    [provider]  atorvastatin (LIPITOR) 80 MG tablet Take 40 mg by mouth daily.    [provider]  buPROPion (WELLBUTRIN SR) 150 MG 12 hr tablet Take 150 mg by mouth 2 (two) times daily.    [provider]  clobetasol ointment (TEMOVATE) 0.05 % APPLY SMALL AMOUNT TOPICALLY EVERY DAY AS NEEDED DO NOT USE ON FACE OR GENITALS 09/15/21   [provider]  finasteride (PROSCAR) 5 MG tablet Take 5 mg by mouth at bedtime.    [provider]  gabapentin (NEURONTIN) 300 MG capsule Take 300-600 mg by mouth See admin instructions. Take 600 mg by mouth three times daily and  300 mg at night    [provider]  ketoconazole (NIZORAL) 2 % shampoo Apply 1 application topically every other day.    [provider]  Menthol-Methyl Salicylate (THERA-GESIC PLUS) CREA Apply 1 application topically at bedtime. Apply to lower back    [provider]  Multiple Vitamins-Minerals (MULTIVITAMINS THER. W/MINERALS) TABS Take 1 tablet by mouth daily.    [provider]  nortriptyline (PAMELOR) 10 MG capsule 2 CAPSULES EVERY NIGHT AT BEDTIME 12/20/17   Posey Pronto,  Donika K, DO  saccharomyces boulardii (FLORASTOR) 250 MG capsule Take 1 capsule (250 mg total) by mouth 2 (two) times daily. 05/31/17   Lavina Hamman, MD  sildenafil (VIAGRA) 100 MG tablet Take 100 mg by mouth as needed. 02/27/20   [provider]  torsemide (DEMADEX) 20 MG tablet Take 1-2 tablets (20-40 mg total) by mouth daily. 01/26/22   Hoyt Koch, MD      Allergies    Lisinopril, Amlodipine, and Codeine sulfate    Review of Systems   Review of Systems  Constitutional:  Positive for activity change.  Psychiatric/Behavioral:  Positive for confusion.   All other systems reviewed and are negative.   Physical Exam Updated Vital Signs BP (!) 172/71 (BP Location: Right Arm)    Pulse 81   Temp (!) 97.4 F (36.3 C) (Oral)   Resp 18   SpO2 90%  Physical Exam Vitals and nursing note reviewed.  Constitutional:      General: He is not in acute distress.    Appearance: He is obese. He is not ill-appearing or toxic-appearing.  HENT:     Head: Normocephalic and atraumatic.     Nose: Nose normal.     Mouth/Throat:     Mouth: Mucous membranes are moist.     Pharynx: Oropharynx is clear.  Eyes:     General: No scleral icterus.    Extraocular Movements: Extraocular movements intact.     Pupils: Pupils are equal, round, and reactive to light.  Cardiovascular:     Rate and Rhythm: Normal rate and regular rhythm.     Pulses: Normal pulses.     Heart sounds: No murmur heard. Pulmonary:     Effort: Pulmonary effort is normal. No respiratory distress.     Breath sounds: Normal breath sounds.  Abdominal:     General: Bowel sounds are normal. There is no distension.     Palpations: Abdomen is soft.     Tenderness: There is no abdominal tenderness.  Musculoskeletal:        General: Signs of injury present.     Cervical back: Neck supple.     Right lower leg: Edema present.     Left lower leg: Edema present.     Comments: Redness to the left middle finger, no laceration or deformity. Neurovascularly intact   Skin:    General: Skin is warm and dry.     Capillary Refill: Capillary refill takes less than 2 seconds.     Findings: No rash.  Neurological:     General: No focal deficit present.     Mental Status: He is alert. He is disoriented and confused.     GCS: GCS eye subscore is 4. GCS verbal subscore is 4. GCS motor subscore is 6.  Psychiatric:        Mood and Affect: Mood normal.        Behavior: Behavior normal.        Thought Content: Thought content normal.        Judgment: Judgment normal.     ED Results / Procedures / Treatments   Labs (all labs ordered are listed, but only abnormal results are displayed) Labs Reviewed  COMPREHENSIVE METABOLIC  PANEL - Abnormal; Notable for the following components:      Result Value   Creatinine, Ser 1.33 (*)    Total Bilirubin 1.4 (*)    GFR, Estimated 55 (*)    All other components within normal limits  CBC WITH  DIFFERENTIAL/PLATELET - Abnormal; Notable for the following components:   Platelets 141 (*)    All other components within normal limits  URINALYSIS, ROUTINE W REFLEX MICROSCOPIC - Abnormal; Notable for the following components:   Hgb urine dipstick SMALL (*)    Ketones, ur 5 (*)    Protein, ur >=300 (*)    Bacteria, UA RARE (*)    All other components within normal limits  CK - Abnormal; Notable for the following components:   Total CK 48 (*)    All other components within normal limits  RESP PANEL BY RT-PCR (FLU A&B, COVID) ARPGX2  AMMONIA  CBG MONITORING, ED  TROPONIN I (HIGH SENSITIVITY)  TROPONIN I (HIGH SENSITIVITY)   EKG None  Radiology CT Head Wo Contrast  Result Date: 05/01/2022 CLINICAL DATA:  Fall EXAM: CT HEAD WITHOUT CONTRAST CT CERVICAL SPINE WITHOUT CONTRAST TECHNIQUE: Multidetector CT imaging of the head and cervical spine was performed following the standard protocol without intravenous contrast. Multiplanar CT image reconstructions of the cervical spine were also generated. RADIATION DOSE REDUCTION: This exam was performed according to the departmental dose-optimization program which includes automated exposure control, adjustment of the mA and/or kV according to patient size and/or use of iterative reconstruction technique. COMPARISON:  None Available. FINDINGS: CT HEAD FINDINGS Brain: No evidence of acute infarction, hemorrhage, hydrocephalus, extra-axial collection or mass lesion/mass effect. There is sequela of severe chronic microvascular ischemic change. Advanced generalized volume loss. Vascular: No hyperdense vessel or unexpected calcification. Skull: Normal. Negative for fracture or focal lesion. Sinuses/Orbits: Bilateral lens replacement. Paranasal  sinuses are clear. Other: None. CT CERVICAL SPINE FINDINGS Alignment: Normal. Skull base and vertebrae: No acute fracture. No primary bone lesion or focal pathologic process. Soft tissues and spinal canal: No prevertebral fluid or swelling. No visible canal hematoma. Disc levels:  No evidence of high-grade spinal canal stenosis. Upper chest: Negative. Other: Small subcentimeter left thyroid nodule requiring no further follow-up. IMPRESSION: 1. No acute intracranial abnormality. Sequela of severe chronic microvascular ischemic change and generalized volume loss. 2. No acute fracture or traumatic malalignment of the cervical spine. Electronically Signed   By: Marin Roberts M.D.   On: 05/01/2022 13:37   CT Cervical Spine Wo Contrast  Result Date: 05/01/2022 CLINICAL DATA:  Fall EXAM: CT HEAD WITHOUT CONTRAST CT CERVICAL SPINE WITHOUT CONTRAST TECHNIQUE: Multidetector CT imaging of the head and cervical spine was performed following the standard protocol without intravenous contrast. Multiplanar CT image reconstructions of the cervical spine were also generated. RADIATION DOSE REDUCTION: This exam was performed according to the departmental dose-optimization program which includes automated exposure control, adjustment of the mA and/or kV according to patient size and/or use of iterative reconstruction technique. COMPARISON:  None Available. FINDINGS: CT HEAD FINDINGS Brain: No evidence of acute infarction, hemorrhage, hydrocephalus, extra-axial collection or mass lesion/mass effect. There is sequela of severe chronic microvascular ischemic change. Advanced generalized volume loss. Vascular: No hyperdense vessel or unexpected calcification. Skull: Normal. Negative for fracture or focal lesion. Sinuses/Orbits: Bilateral lens replacement. Paranasal sinuses are clear. Other: None. CT CERVICAL SPINE FINDINGS Alignment: Normal. Skull base and vertebrae: No acute fracture. No primary bone lesion or focal pathologic  process. Soft tissues and spinal canal: No prevertebral fluid or swelling. No visible canal hematoma. Disc levels:  No evidence of high-grade spinal canal stenosis. Upper chest: Negative. Other: Small subcentimeter left thyroid nodule requiring no further follow-up. IMPRESSION: 1. No acute intracranial abnormality. Sequela of severe chronic microvascular ischemic change and generalized volume loss. 2.  No acute fracture or traumatic malalignment of the cervical spine. Electronically Signed   By: Marin Roberts M.D.   On: 05/01/2022 13:37   DG Hand Complete Left  Result Date: 05/01/2022 CLINICAL DATA:  Fall, LEFT middle finger injury EXAM: LEFT HAND - COMPLETE 3+ VIEW COMPARISON:  01/13/2021 FINDINGS: Fingers superimposed on lateral view limiting assessment. Osseous mineralization normal. Joint spaces preserved. No acute fracture, dislocation, or bone destruction. Small metallic foreign bodies at little finger and at ulnar margin of hand. IMPRESSION: No acute osseous abnormality. Electronically Signed   By: Lavonia Dana M.D.   On: 05/01/2022 13:33    Procedures Procedures   Medications Ordered in ED Medications - No data to display  ED Course/ Medical Decision Making/ A&P                           Medical Decision Making Amount and/or Complexity of Data Reviewed Labs: ordered. Radiology: ordered.  This patient presents to the ED with chief complaint(s) of fall and confusion with pertinent past medical history of DVT, PE, HTN,  which further complicates the presenting complaint. The complaint involves an extensive differential diagnosis and also carries with it a high risk of complications and morbidity.    The differential diagnosis includes intracranial bleed, stroke, infection   Additional history obtained: Additional history obtained from family Records reviewed Care Everywhere/External Records and Primary Care Documents  ED Course and Reassessment: 76 year old male who presents to the  emergency department with fall and confusion.  On physical exam he is chronically ill-appearing but in no acute distress.  He is nonseptic and nontoxic in appearance.  He is confused to year and month which is not his baseline.  He does have a drooping right eyelid but has a history of Bell's palsy and this is not abnormal for him.  No other cranial nerve deficits.  No focal weakness or sensory deficit.  No ataxia.  No dysarthria or pronator drift.  He ambulates with his cane without assistance which is baseline for him.    Basic labs as well as UA, ammonia, troponin, CK, COVID and flu were obtained to evaluate for any sort of infectious cause of his delirium.  I also ordered a CT head and C-spine and left hand given fall.  The left hand does have some redness over the left middle finger and it does intermittently dislocate.  He is able to reduce the finger without pain.  Labs are unremarkable.  There is no anemia, leukocytosis.  His electrolytes are within normal limits.  His creatinine is stable.  COVID and flu is negative.  UA without UTI.  No hyper or hypoglycemia.  Ammonia is normal.  Troponin is negative.  CT head and C-spine are also negative.  The left hand imaging is negative.  Suspect that he may have a ligamentous injury given the flexibility of the finger.  EKG without arrhythmia.  Dr. Roderic Palau evaluated the patient at bedside.  He is unable to have MRI given retained shrapnel.  It is possible that he had a stroke which is causing his change in mental status.  It was recommended that he be admitted for observation but the patient and family declined.  Do not feel that he needs to sign out AMA at this time.  I have referred him to neurology as well as following up with his primary care provider.  We have given him strict return precautions for worsening altered mental status.  The son verbalized understanding.  He has somebody to stay and watch the patient overnight.  Otherwise we will discharge him  at this time.  Independent labs interpretation:  The following labs were independently interpreted: CBC nl, ck negative, cmp with stable creatinine and electrolytes, covid/flu negative, glucose negative, ammonia normal, troponin negative,UA negative for UTI  Independent visualization of imaging: - I independently visualized the following imaging with scope of interpretation limited to determining acute life threatening conditions related to emergency care: CT head and c-spine, hand left, which revealed no acute findings  Consultation: - Consulted or discussed management/test interpretation w/ external professional: not indicated   Consideration for admission or further workup: recommended admission for observation, but patient declined Social Determinants of health: none identified  Final Clinical Impression(s) / ED Diagnoses Final diagnoses:  Fall, initial encounter  Altered mental status, unspecified altered mental status type    Rx / DC Orders ED Discharge Orders          Ordered    Ambulatory referral to Neurology       Comments: An appointment is requested in approximately: 1 week   05/01/22 1543              Mickie Hillier, PA-C 05/01/22 1553    Milton Ferguson, MD 05/01/22 1659

## 2022-05-01 NOTE — ED Triage Notes (Addendum)
Patient got tangled up in a blanket and fell today. He said the only thing that is hurting him is his left middle finger and right hand is sore. Left middle finger is swollen. Patient is on Eloquis. Hypertensive in triage, but did not take HTN medication today. Uses a cane to ambulate.

## 2022-05-02 LAB — URINE CULTURE: URINE CULTURE: 5000 — AB

## 2022-05-04 ENCOUNTER — Encounter (HOSPITAL_COMMUNITY): Payer: Self-pay

## 2022-05-04 ENCOUNTER — Emergency Department (HOSPITAL_COMMUNITY): Payer: Medicare HMO

## 2022-05-04 ENCOUNTER — Emergency Department (HOSPITAL_COMMUNITY)
Admission: EM | Admit: 2022-05-04 | Discharge: 2022-05-04 | Disposition: A | Discharge status: Home / Self Care | Payer: Medicare HMO | Attending: Emergency Medicine | Admitting: Emergency Medicine

## 2022-05-04 ENCOUNTER — Other Ambulatory Visit: Payer: Self-pay

## 2022-05-04 ENCOUNTER — Telehealth (INDEPENDENT_AMBULATORY_CARE_PROVIDER_SITE_OTHER): Payer: Self-pay | Admitting: Family Medicine

## 2022-05-04 DIAGNOSIS — Z7901 Long term (current) use of anticoagulants: Secondary | ICD-10-CM | POA: Diagnosis not present

## 2022-05-04 DIAGNOSIS — Z79899 Other long term (current) drug therapy: Secondary | ICD-10-CM | POA: Diagnosis not present

## 2022-05-04 DIAGNOSIS — I10 Essential (primary) hypertension: Secondary | ICD-10-CM | POA: Diagnosis not present

## 2022-05-04 DIAGNOSIS — R600 Localized edema: Secondary | ICD-10-CM | POA: Insufficient documentation

## 2022-05-04 DIAGNOSIS — K8020 Calculus of gallbladder without cholecystitis without obstruction: Secondary | ICD-10-CM | POA: Diagnosis not present

## 2022-05-04 DIAGNOSIS — N189 Chronic kidney disease, unspecified: Secondary | ICD-10-CM | POA: Insufficient documentation

## 2022-05-04 DIAGNOSIS — R4182 Altered mental status, unspecified: Secondary | ICD-10-CM | POA: Diagnosis not present

## 2022-05-04 DIAGNOSIS — Z1152 Encounter for screening for COVID-19: Secondary | ICD-10-CM | POA: Insufficient documentation

## 2022-05-04 DIAGNOSIS — R410 Disorientation, unspecified: Secondary | ICD-10-CM | POA: Diagnosis not present

## 2022-05-04 DIAGNOSIS — I129 Hypertensive chronic kidney disease with stage 1 through stage 4 chronic kidney disease, or unspecified chronic kidney disease: Secondary | ICD-10-CM | POA: Insufficient documentation

## 2022-05-04 LAB — CBC WITH DIFFERENTIAL/PLATELET
Abs Immature Granulocytes: 0.02 10*3/uL (ref 0.00–0.07)
Basophils Absolute: 0.1 10*3/uL (ref 0.0–0.1)
Basophils Relative: 1 %
Eosinophils Absolute: 0.2 10*3/uL (ref 0.0–0.5)
Eosinophils Relative: 3 %
HCT: 49.4 % (ref 39.0–52.0)
Hemoglobin: 15.9 g/dL (ref 13.0–17.0)
Immature Granulocytes: 0 %
Lymphocytes Relative: 16 %
Lymphs Abs: 1.3 10*3/uL (ref 0.7–4.0)
MCH: 27.8 pg (ref 26.0–34.0)
MCHC: 32.2 g/dL (ref 30.0–36.0)
MCV: 86.4 fL (ref 80.0–100.0)
Monocytes Absolute: 0.5 10*3/uL (ref 0.1–1.0)
Monocytes Relative: 7 %
Neutro Abs: 5.9 10*3/uL (ref 1.7–7.7)
Neutrophils Relative %: 73 %
Platelets: 197 10*3/uL (ref 150–400)
RBC: 5.72 MIL/uL (ref 4.22–5.81)
RDW: 14.6 % (ref 11.5–15.5)
WBC: 8.1 10*3/uL (ref 4.0–10.5)
nRBC: 0 % (ref 0.0–0.2)

## 2022-05-04 LAB — URINALYSIS, ROUTINE W REFLEX MICROSCOPIC
Bilirubin Urine: NEGATIVE
Glucose, UA: NEGATIVE mg/dL
Ketones, ur: 20 mg/dL — AB
Leukocytes,Ua: NEGATIVE
Nitrite: NEGATIVE
Protein, ur: >=300 mg/dL — AB
Specific Gravity, Urine: 1.024 (ref 1.005–1.030)
pH: 5 (ref 5.0–8.0)

## 2022-05-04 LAB — RESP PANEL BY RT-PCR (FLU A&B, COVID) ARPGX2
Influenza A by PCR: NEGATIVE
Influenza B by PCR: NEGATIVE
SARS Coronavirus 2 by RT PCR: NEGATIVE

## 2022-05-04 LAB — COMPREHENSIVE METABOLIC PANEL
ALT: 14 U/L (ref 0–44)
AST: 21 U/L (ref 15–41)
Albumin: 3.8 g/dL (ref 3.5–5.0)
Alkaline Phosphatase: 83 U/L (ref 38–126)
Anion gap: 11 (ref 5–15)
BUN: 21 mg/dL (ref 8–23)
CO2: 24 mmol/L (ref 22–32)
Calcium: 9 mg/dL (ref 8.9–10.3)
Chloride: 103 mmol/L (ref 98–111)
Creatinine, Ser: 1.13 mg/dL (ref 0.61–1.24)
GFR, Estimated: >60 mL/min (ref >60)
Glucose, Bld: 90 mg/dL (ref 70–99)
Potassium: 4.1 mmol/L (ref 3.5–5.1)
Sodium: 138 mmol/L (ref 135–145)
Total Bilirubin: 1.5 mg/dL — ABNORMAL HIGH (ref 0.3–1.2)
Total Protein: 6.8 g/dL (ref 6.5–8.1)

## 2022-05-04 LAB — AMMONIA: Ammonia: 39 umol/L — ABNORMAL HIGH (ref 9–35)

## 2022-05-04 LAB — LACTIC ACID, PLASMA: Lactic Acid, Venous: 0.9 mmol/L (ref 0.5–1.9)

## 2022-05-04 LAB — CBG MONITORING, ED: Glucose-Capillary: 103 mg/dL — ABNORMAL HIGH (ref 70–99)

## 2022-05-04 NOTE — ED Triage Notes (Signed)
Patient's son reports that the patient had a flu and Covid vaccine 5 days ago and states on Friday AM the patient confused, fell, and not eating. Patient was seen 3 days ago in the ED for the fall and the son states he had CT scans and had a dislocated finger at that time. Patient continues to have increased weakness and not eating as per the son.

## 2022-05-04 NOTE — Discharge Instructions (Addendum)
All the urine and blood test today look okay.  The CAT scan did not show any sign of stroke.  May be a result of having both of those vaccines.  I would expect the cyst will continue to get better.

## 2022-05-04 NOTE — ED Provider Notes (Signed)
[facility] DEPT Provider Note   CSN: [id number] Arrival date & time: 05/04/22  0093     History {Add pertinent medical, surgical, social history, OB history to HPI:1} Chief Complaint  Patient presents with   Fall   Weakness    KARTIK FERNANDO is a 76 y.o. male.   Fall  Weakness    Has a history of hypertension, DVT PE, chronic kidney disease on chronic anticoagulation.  Patient was seen in the emergency room on November 17.  Patient was brought in because sometime the evening prior he had a fall.  Son felt that his father was confused and not at his baseline.  He should have laboratory tests and x-rays.  I considered MRI but the patient has shrapnel and is unable to have an MRI.  The ED team recommend admission to the hospital for further evaluation for possible occult stroke with his confusion.  Patient did not want to stay in the hospital so they brought him home.  Patient returns because family states he does continue to seem confused.  Is not eating well.  Home Medications Prior to Admission medications   Medication Sig Start Date End Date Taking? Authorizing Provider  acetaminophen (TYLENOL) 500 MG tablet Take 1,000 mg by mouth every 6 (six) hours as needed for moderate pain. 02/21/13   Norins, Heinz Knuckles, MD  acidophilus (RISAQUAD) CAPS capsule Take 1 capsule by mouth daily.    [provider]  apixaban (ELIQUIS) 5 MG TABS tablet Take 5 mg by mouth 2 (two) times daily.    [provider]  atenolol (TENORMIN) 25 MG tablet Take 25 mg by mouth daily.    [provider]  atorvastatin (LIPITOR) 80 MG tablet Take 40 mg by mouth daily.    [provider]  buPROPion (WELLBUTRIN SR) 150 MG 12 hr tablet Take 150 mg by mouth 2 (two) times daily.    [provider]  clobetasol ointment (TEMOVATE) 0.05 % APPLY SMALL AMOUNT TOPICALLY EVERY DAY AS NEEDED DO NOT USE ON FACE OR GENITALS 09/15/21   [provider]   finasteride (PROSCAR) 5 MG tablet Take 5 mg by mouth at bedtime.    [provider]  gabapentin (NEURONTIN) 300 MG capsule Take 300-600 mg by mouth See admin instructions. Take 600 mg by mouth three times daily and  300 mg at night    [provider]  ketoconazole (NIZORAL) 2 % shampoo Apply 1 application topically every other day.    [provider]  Menthol-Methyl Salicylate (THERA-GESIC PLUS) CREA Apply 1 application topically at bedtime. Apply to lower back    [provider]  Multiple Vitamins-Minerals (MULTIVITAMINS THER. W/MINERALS) TABS Take 1 tablet by mouth daily.    [provider]  nortriptyline (PAMELOR) 10 MG capsule 2 CAPSULES EVERY NIGHT AT BEDTIME 12/20/17   Patel, Donika K, DO  saccharomyces boulardii (FLORASTOR) 250 MG capsule Take 1 capsule (250 mg total) by mouth 2 (two) times daily. 05/31/17   Lavina Hamman, MD  sildenafil (VIAGRA) 100 MG tablet Take 100 mg by mouth as needed. 02/27/20   [provider]  torsemide (DEMADEX) 20 MG tablet Take 1-2 tablets (20-40 mg total) by mouth daily. 01/26/22   Hoyt Koch, MD      Allergies    Lisinopril, Amlodipine, and Codeine sulfate    Review of Systems   Review of Systems  Neurological:  Positive for weakness.    Physical Exam Updated Vital Signs  BP (!) 176/79 (BP Location: Right Arm)   Pulse 76   Temp 97.8 F (36.6 C) (Oral)   Resp 18   Ht 1.676 m ('5\' 6"'$ )   Wt 119.3 kg   SpO2 90%   BMI 42.45 kg/m  Physical Exam Vitals and nursing note reviewed.  Constitutional:      Appearance: He is well-developed. He is not diaphoretic.     Comments: Increased bmi  HENT:     Head: Normocephalic and atraumatic.     Right Ear: External ear normal.     Left Ear: External ear normal.  Eyes:     General: No visual field deficit or scleral icterus.       Right eye: No discharge.        Left eye: No discharge.     Conjunctiva/sclera: Conjunctivae normal.  Neck:      Trachea: No tracheal deviation.  Cardiovascular:     Rate and Rhythm: Normal rate and regular rhythm.  Pulmonary:     Effort: Pulmonary effort is normal. No respiratory distress.     Breath sounds: Normal breath sounds. No stridor. No wheezing or rales.  Abdominal:     General: Bowel sounds are normal. There is no distension.     Palpations: Abdomen is soft.     Tenderness: There is no abdominal tenderness. There is no guarding or rebound.  Musculoskeletal:        General: No tenderness or deformity.     Cervical back: Neck supple.     Right lower leg: Edema present.     Left lower leg: Edema present.  Skin:    General: Skin is warm and dry.     Comments: Erythema bilateral lower extrem , venous stasis dermatitis?  Neurological:     General: No focal deficit present.     Mental Status: He is alert.     Cranial Nerves: No cranial nerve deficit (no facial droop, extraocular movements intact, no slurred speech), dysarthria or facial asymmetry.     Sensory: No sensory deficit.     Motor: No abnormal muscle tone or seizure activity.     Coordination: Coordination normal.     Comments: Normal finger to nose, normal speech  Psychiatric:        Mood and Affect: Mood normal.     ED Results / Procedures / Treatments   Labs (all labs ordered are listed, but only abnormal results are displayed) Labs Reviewed  CBG MONITORING, ED - Abnormal; Notable for the following components:      Result Value   Glucose-Capillary 103 (*)    All other components within normal limits  CULTURE, BLOOD (ROUTINE X 2)  CULTURE, BLOOD (ROUTINE X 2)  RESP PANEL BY RT-PCR (FLU A&B, COVID) ARPGX2  COMPREHENSIVE METABOLIC PANEL  CBC WITH DIFFERENTIAL/PLATELET  URINALYSIS, ROUTINE W REFLEX MICROSCOPIC  LACTIC ACID, PLASMA  AMMONIA    EKG EKG Interpretation  Date/Time:  Monday May 04 2022 10:03:04 EST Ventricular Rate:  73 PR Interval:  203 QRS Duration: 165 QT Interval:  438 QTC  Calculation: 483 R Axis:   -67 Text Interpretation: Sinus rhythm Probable left atrial enlargement RBBB and LAFB Left ventricular hypertrophy  LAFB is new since last tracing Confirmed by Dorie Rank [phone]) on 05/04/2022 10:07:37 AM  Radiology No results found.  Procedures Procedures  {Document cardiac monitor, telemetry assessment procedure when appropriate:1}  Medications Ordered in ED Medications - No data to display  ED Course/ Medical Decision Making/ A&P  Medical Decision Making  ***  {Document critical care time when appropriate:1} {Document review of labs and clinical decision tools ie heart score, Chads2Vasc2 etc:1}  {Document your independent review of radiology images, and any outside records:1} {Document your discussion with family members, caretakers, and with consultants:1} {Document social determinants of health affecting pt's care:1} {Document your decision making why or why not admission, treatments were needed:1} Final Clinical Impression(s) / ED Diagnoses Final diagnoses:  None    Rx / DC Orders ED Discharge Orders     None

## 2022-05-04 NOTE — ED Provider Triage Note (Signed)
Emergency Medicine Provider Triage Evaluation Note  Tanner [name] , a 76 y.o. male  was evaluated in triage.  Pt complains of altered mental status.  History is given by the patient's sons who are at bedside.  He was seen.  On the 17th after having a fall.  His sons report that he had both his COVID and flu vaccine the day before, became confused after his fall.  He was refusing admission at that time.  His son who has healthcare power of attorney states that he has not gotten any better since then and in fact has been very weak, refusing to eat, more confused and feels that he definitely needs to be admitted today for further management.  Patient had a finger injury and his son states that he is seen it fully dislocate at home and then relocate.  Previous imaging was negative.  Review of Systems  Positive: Ams  Negative: fever  Physical Exam  BP (!) 176/79 (BP Location: Right Arm)   Pulse 76   Temp 97.8 F (36.6 C) (Oral)   Resp 18   SpO2 90%  Gen:   Awake, no distress   Resp:  Normal effort  MSK:   Moves extremities without difficulty  Other:    Medical Decision Making  Medically screening exam initiated at 10:07 AM.  Appropriate orders placed.  Lizabeth Leyden was informed that the remainder of the evaluation will be completed by another provider, this initial triage assessment does not replace that evaluation, and the importance of remaining in the ED until their evaluation is complete.     Margarita Mail, PA-C 05/04/22 1009

## 2022-05-04 NOTE — ED Provider Notes (Signed)
UA was negative for evidence of infection.  Findings discussed with the patient and his family members.  At this time there is no indication for admission and feel that patient is stable for discharge.   Blanchie Dessert, MD 05/04/22 [phone]

## 2022-05-04 NOTE — Telephone Encounter (Signed)
-----   Message from Thomas Gammon, DO sent at 05/04/2022  9:47 AM EST -----  Please let pt know, chest x-ray is normal. No change in regimen

## 2022-05-04 NOTE — Telephone Encounter (Signed)
PT aware

## 2022-05-04 NOTE — Result Encounter Note (Signed)
I will discuss with patient at upcoming visit.

## 2022-05-05 ENCOUNTER — Telehealth (INDEPENDENT_AMBULATORY_CARE_PROVIDER_SITE_OTHER): Payer: Self-pay | Admitting: Family Medicine

## 2022-05-05 DIAGNOSIS — M542 Cervicalgia: Secondary | ICD-10-CM

## 2022-05-05 DIAGNOSIS — M5030 Other cervical disc degeneration, unspecified cervical region: Secondary | ICD-10-CM

## 2022-05-05 LAB — CYTOPATHOLOGY, URINARY

## 2022-05-05 LAB — APOLIPOPROTEIN B,SERUM: APOLIPOPROTEIN B: 56 mg/dL (ref <90)

## 2022-05-05 NOTE — Telephone Encounter (Signed)
-----   Message from Trenton Gammon, DO sent at 05/05/2022  2:05 PM EST -----  Let the patient know that neck x-ray does show degenerative disc disease.  Refer to physical therapy if patient agrees

## 2022-05-05 NOTE — Telephone Encounter (Signed)
Patient aware and voiced understanding patient is willing to do PT

## 2022-05-06 NOTE — Telephone Encounter (Signed)
Internal order, utn will call patient to schedule.

## 2022-05-09 LAB — CULTURE, BLOOD (ROUTINE X 2): Culture: NO GROWTH

## 2022-05-13 ENCOUNTER — Telehealth: Payer: Self-pay

## 2022-05-13 ENCOUNTER — Ambulatory Visit (HOSPITAL_COMMUNITY)
Admission: RE | Admit: 2022-05-13 | Discharge: 2022-05-13 | Disposition: A | Discharge status: Still In Hospital | Payer: Commercial Managed Care - PPO | Source: Ambulatory Visit | Attending: Family Medicine | Admitting: Family Medicine

## 2022-05-13 ENCOUNTER — Other Ambulatory Visit: Payer: Self-pay

## 2022-05-13 DIAGNOSIS — M25511 Pain in right shoulder: Secondary | ICD-10-CM | POA: Insufficient documentation

## 2022-05-13 DIAGNOSIS — M5030 Other cervical disc degeneration, unspecified cervical region: Secondary | ICD-10-CM | POA: Insufficient documentation

## 2022-05-13 DIAGNOSIS — M25512 Pain in left shoulder: Secondary | ICD-10-CM | POA: Insufficient documentation

## 2022-05-13 NOTE — Telephone Encounter (Signed)
     Patient  visit on 11/20  at [facility]   Have you been able to follow up with your primary care ? Yes   The patient was or was not able to obtain any needed medicine or equipment. Yes   Are there diet recommendations that you are having difficulty following? Na   Patient expresses understanding of discharge instructions and education provided has no other needs at this time.  Yes     [place], [facility], Care Management  [phone] [address], [place], [place] [address] Phone: [phone] Email: [email or web address]

## 2022-05-13 NOTE — PT Evaluation (Signed)
[address] [place], [place], [address]  ([phone]   (Fax) [phone]   Outpatient Rehabilitation Services  Physical Therapy Initial Evaluation    Date: 05/13/2022  Patient Name: Thomas [name]  Date of Birth: [birth year]  MRN: [id number]  Payor: Payor: HU[employer] MEDICARE / Plan: Francine Graven CHOICE PPO / Product Type: PPO /   Referring Physician: Dr Rhoderick Moody, MD  Next Physician Follow-up Visit: TBD  Diagnosis:   Bilateral posterior neck pain [M54.2]  - Primary      Degenerative disc disease, cervical [M50.30]        PMH:   Past Medical History:   Diagnosis Date    Arthritis     Dysuria     Enteritis due to Rotavirus     Gout     Headache     Hemorrhoid     Hypercholesterolemia     Hypertension        Past Surgical History:   Procedure Laterality Date    HX HEMORRHOIDECTOMY             HPI   Thomas [name] is a 76 y.o., male, who presents to [facility] outpatient physical therapy clinic with a chief complain of cervical spine pain that has become progressively worse over the past 6 months.  He describes the pain as a constant ache that become more intense as the day goes on.  He has functional difficulty with lifting, carrying, turning his head, looking down to read, and general housekeeping.  He uses Tylenol to manage his symptoms but has had limited success.  He notes the pain radiates to B shoulders R > L and into the laterad region of R upper arm.  He rates is pain at 9/10.    Diagnostic tests: x-rays of cervical spine on 05/01/22 show:   IMPRESSION:  Numerous degenerative changes are seen in the mid to lower cervical spine, as described above.     Mild anterolisthesis of C4 with respect to C5 and straightening of the curvature in the cervical spine are nonspecific findings      Examination findings   Neck Disability Index (NDI) total score: 16/50    ROM     pain provoking   Cervical flexion 20% limited  (yes)   Cervical extension 66% limited  (yes)   RIGHT side bend 50% limited (yes)   LEFT  side bend 33% limited  (no)   RIGHT rotation 66% limited  (yes)   LEFT rotation 33% limited  (no)   Comments: worse with extension, R SB, and R rot    Strength:   right left   Shrug (C3-4) 4+/5 5/5   Shoulder flexion 4-/5 4+/5   Shoulder abduction (C5) 4-/5 4/5   Elbow flexion (C6) 4/5 4+/5   Elbow extension (C7) 4/5 4+/5   Wrist extension (C6) 4+/5 5/5   Wrist flexion (C7) 4+/5 5/5   Thumb extension (C8) 4+/5 5/5   Finger abduction (T1) 4+/5 5/5     Palpation: Pain was elicited on palpation of B upper trap, cervical paraspinal, rhomboids, scalanes, SCM, middle trap, pec major, and levator scapulae musculature    Posture: Patient is seated with a forward head, rounded shoulder, slouched sitting posture    Sensation: intact    Reflexes:   Biceps 1+ B   Triceps 1+ B   Brachioradialis 1+ B             Clinical impressions   Thomas [name] presents to  outpatient physical therapy with signs and symptoms related to cervical spine DDD. Impairments demonstrated today include limited ROM, decreased strength, poor posture, and a decline in functional mobility. Each of these impairments significantly impacts the patient's quality of life by limiting their ability to carryout ADLs/IADLs, including lifting, carrying, turning his head, looking down to read, and general housekeeping.. The NDI score further indicates personal disability. This patient will benefit from physical therapy services to address these impairments. he was taught a HEP and performed exercises that included UT stretch, B scapular retraction, AROM cervical flex/ext, B rot, and posterior shoulder rolls. Educated provided to patient to limit activity to durations before pain begins to worsen as well as to avoid aggravating activities in order to minimize functional deficits associated with high pain levels.    Evaluation Complexity: Moderate complexity       Treatment   Manual therapy x 15 minutes as PT performed STM to cervical and thoracic paraspinals, upper  trap, LS, rhomboids, middle trap, scalenes, SCM, and suboccipital musculature followed by gentle manual traction, SOR, and PROM into cervical flexion/ extension, B SB, and B rot    Therapeutic exercise x 15 minutes as PT instructed patient in performance of UT stretch, B scapular retraction, AROM cervical flex/ext, B rot, and posterior shoulder rolls x 20 each      Goals   STG:  In 2 weeks, pt will demonstrate compliance with their HEP to maximize outcomes with skilled intervention.  In 2 weeks, pt will improve NDI score from 16/50 to 13/50 in order to improve QOL.   In 2 weeks, pt will decrease their worst reported pain levels from 9 to 6 to promote more normalized function.   In 2 weeks, pt will demonstrate improve siting posture to decrease undue stress     LTG:   In 12 weeks, pt will improve NDI score from 16/50 to 8/50 in order to return to prior level of functional capabilities.   In 12 weeks, pt will decrease their worst reported pain levels from 9 to 3 improve QOL and promote more normalized function.   In 12 weeks, pt will improve cervical spine ROM to [facility] to allow patient to turn his head when driving and look down when reading without increase in symptoms   In 12 weeks improve UE strength to 5/5 to allow patient to perform lifting, carrying, and general housekeeping without an increase in symptoms especially at the end of the day    Patient Goals: to be able to lift and carry firewood and turn his head without pain    Total Treatment Time: 60 minutes      Plan of care   Interventions to be included in the patient's plan of care include electrical stimulation, therapeutic ultrasound, taping, ice packs, moist heat, vasocompression, manual therapy, gait training, therapeutic exercise, therapeutic activity, and neuromuscular re-education. Manual therapy and modalities will be used as needed for pain control. Patient will participate in an exercise program that will help improve independence and overall level  of function. Continue to progress per patient's tolerance.     Recommendations: Patient will benefit from physical therapy services for 2-3 days a week for 12 weeks. Plan to reassess as needed.     Certification of Plan               From: 05/13/22  To: 08/10/22     This is to certify that the above named patient who is under my care requires outpatient therapy services  as described in the above treatment plan. The plan of care that I have established/approved will be reviewed periodically to determine continued need.            ____________________________________          __________                  ___________  Physician Signature                                                         Date                               Time     The risks/benefits of therapy have been discussed with the patient and he/she is in agreement with the established plan of care.     Evaluating therapist   Sunday Corn, PT  05/13/2022, 09:59

## 2022-05-15 ENCOUNTER — Ambulatory Visit (HOSPITAL_COMMUNITY)
Admission: RE | Admit: 2022-05-15 | Discharge: 2022-05-15 | Disposition: A | Discharge status: Still In Hospital | Payer: Commercial Managed Care - PPO | Source: Ambulatory Visit | Attending: Family Medicine | Admitting: Family Medicine

## 2022-05-15 ENCOUNTER — Other Ambulatory Visit: Payer: Self-pay

## 2022-05-15 NOTE — PT Treatment (Signed)
[address] [place], [place], [address]  ([phone]   (Fax) [phone]    Outpatient Rehabilitation Services  Physical Therapy Treatment Note    Date: 05/15/2022    Patient Name: Thomas [name]  Date of Birth: 1946-02-17  MRN: [id number]  Payor: Payor: HU[employer]NA MEDICARE / Plan: Francine Graven CHOICE PPO / Product Type: PPO /   Referring Physician:Dr Rhoderick Moody, MD    Diagnosis:   Bilateral posterior neck pain [M54.2]  - Primary      Degenerative disc disease, cervical [M50.30]          Certification of Plan From: 05/13/22  To: 08/10/22   Visit #: 2      Subjective   Patient presents to OP PT treatment with chief complaint of "achy"  Today's pain 5/10    Objective   Manual therapy x 15 minutes   STM to cervical and thoracic paraspinals, upper trap, LS, rhomboids, middle trap, scalenes, SCM, and suboccipital musculature   Gentle manual traction, SOR, and PROM into cervical flexion/ extension, B SB, and B rot     Therapeutic exercise x 23 minutes   UT stretch 20 x 5 seconds   B scapular retraction x 20   AROM cervical flex/ext, B rot x 20   Posterior shoulder rolls x 20   Rows Green x 20     Start Time: 11:00   End time: 11:38        Assessment   Patient presented to treatment today with hypertonicity of  bilateral c-paras and UT.       Plan   Add additional UE stretching NV     The risks/benefits of therapy have been discussed with the patient and he/she is in agreement with the established plan of care.    Therapist   Dierdre Harness, PTA

## 2022-05-18 ENCOUNTER — Ambulatory Visit: Payer: Medicare HMO | Admitting: Internal Medicine

## 2022-05-19 ENCOUNTER — Ambulatory Visit (HOSPITAL_COMMUNITY)
Admission: RE | Admit: 2022-05-19 | Discharge: 2022-05-19 | Disposition: A | Discharge status: Still In Hospital | Payer: Commercial Managed Care - PPO | Source: Ambulatory Visit

## 2022-05-19 ENCOUNTER — Other Ambulatory Visit: Payer: Self-pay

## 2022-05-19 NOTE — PT Treatment (Signed)
[address], [place], [place], [address]  ([phone]   (Fax) [phone]    Outpatient Rehabilitation Services  Physical Therapy Treatment Note    Date: 05/19/2022    Patient Name: Thomas [name]  Date of Birth: 03-28-1946  MRN: [id number]  Payor: Payor: HU[employer]NA MEDICARE / Plan: [employer] CHOICE PPO / Product Type: PPO /   Referring Physician: No ref. provider found   Diagnosis:   Bilateral posterior neck pain [M54.2]  - Primary      Degenerative disc disease, cervical [M50.30]        Certification of Plan From: 05/13/22  To: 08/10/22    Visit #: 3      Subjective   Patient presents to OP PT treatment with chief complaint of increased stiffness and an increase in pain.   Today's pain 4/10    Objective   Modalities x 10 minutes   MH to B- c-paras and UT to prep tissues and decrease hypertonicity     Manual therapy x 10 minutes   STM to cervical and thoracic paraspinals, upper trap, LS, rhomboids, middle trap, scalenes, SCM, and suboccipital musculature   Gentle manual traction, SOR NT   PROM into cervical flexion/ extension, B SB, and B rot     Therapeutic exercise x 20 minutes   UT stretch 20 x 5 seconds   Levator Scap stretch 10 x 5 seconds   B scapular retraction x 20  AROM cervical flex/ext, B rot x 20   Posterior shoulder rolls x 20   Rows Green x 20       Start Time: 2:30  End time: 3:10        Assessment   Patient completed all exercises well today but had some tightness and stiffness at the start of the visit that decreased with modalities and MT. Patient ROM continues to improve with current routine       Plan   Progress as patient can tolerate and progress with more cervical stretches NV     The risks/benefits of therapy have been discussed with the patient and he/she is in agreement with the established plan of care.    Therapist   Dierdre Harness, PTA

## 2022-05-21 ENCOUNTER — Ambulatory Visit (HOSPITAL_COMMUNITY)
Admission: RE | Admit: 2022-05-21 | Discharge: 2022-05-21 | Disposition: A | Discharge status: Still In Hospital | Payer: Commercial Managed Care - PPO | Source: Ambulatory Visit | Attending: Family Medicine | Admitting: Family Medicine

## 2022-05-21 ENCOUNTER — Other Ambulatory Visit (INDEPENDENT_AMBULATORY_CARE_PROVIDER_SITE_OTHER): Payer: Self-pay | Admitting: Family Medicine

## 2022-05-21 ENCOUNTER — Other Ambulatory Visit: Payer: Self-pay

## 2022-05-21 NOTE — PT Treatment (Signed)
[address], [place], [place], [address]  ([phone]   (Fax) [phone]    Outpatient Rehabilitation Services  Physical Therapy Treatment Note    Date: 05/21/2022    Patient Name: Thomas [name]  Date of Birth: [birth year]  MRN: [id number]  Payor: Payor: HUMANA MEDICARE / Plan: Francine Graven CHOICE PPO / Product Type: PPO /   Referring Physician: Rhoderick Moody MD   Diagnosis:   Bilateral posterior neck pain [M54.2]  - Primary      Degenerative disc disease, cervical [M50.30]         Certification of Plan From: 05/13/22  To: 08/10/22    Visit #: 4     Subjective   Patient presents to OP PT treatment with chief complaint of Cervical spiine pain that was a 9-10/10 after packing and moving some boxes yesterday, but notes it is feeling better this afternoon.    Today's pain 3/10    Objective   Modalities x 10 minutes   MH to B- c-paras and UT to prep tissues and decrease hypertonicity      Manual therapy x 15 minutes   STM to cervical and thoracic paraspinals, upper trap, LS, rhomboids, middle trap, scalenes, SCM, and suboccipital musculature   Gentle manual traction, SOR   PROM into cervical flexion/ extension, B SB, and B rot     Therapeutic exercise x 25 minutes   UT stretch 20 x 5 seconds   Levator Scap stretch 10 x 5 seconds   B scapular retraction x 20  AROM cervical flex/ext, B rot x 20   Posterior shoulder rolls x 20   Rows blue t-band x 20   blue t-band lower trap pull backs x 20  Standing shoulder flexion 1# x 20  Standing shoulder abduction 1# x 20        Start Time: 1:28 pm  End time: 2:20 pm        Assessment   PT advised patient to modify his activity level to avoid exacerbation of symptoms when cleaning/ packing object up in his house as he prepares to have new carpet installed in 5 rooms.      Plan   Continue to stress good sitting posture to correct rounded shoulder, slouched sitting position.    The risks/benefits of therapy have been discussed with the patient and he/she is in agreement with the  established plan of care.    Therapist   Sunday Corn, PT

## 2022-05-26 ENCOUNTER — Other Ambulatory Visit: Payer: Self-pay

## 2022-05-26 ENCOUNTER — Ambulatory Visit (HOSPITAL_COMMUNITY)
Admission: RE | Admit: 2022-05-26 | Discharge: 2022-05-26 | Disposition: A | Discharge status: Still In Hospital | Payer: Commercial Managed Care - PPO | Source: Ambulatory Visit | Attending: Family Medicine | Admitting: Family Medicine

## 2022-05-26 ENCOUNTER — Ambulatory Visit (INDEPENDENT_AMBULATORY_CARE_PROVIDER_SITE_OTHER): Payer: Medicare HMO

## 2022-05-26 ENCOUNTER — Ambulatory Visit (INDEPENDENT_AMBULATORY_CARE_PROVIDER_SITE_OTHER): Payer: Medicare HMO | Admitting: Internal Medicine

## 2022-05-26 ENCOUNTER — Encounter: Payer: Self-pay | Admitting: Internal Medicine

## 2022-05-26 VITALS — BP 160/80 | HR 76 | Temp 97.9°F | Ht 66.0 in | Wt 263.0 lb

## 2022-05-26 DIAGNOSIS — I10 Essential (primary) hypertension: Secondary | ICD-10-CM

## 2022-05-26 DIAGNOSIS — E1169 Type 2 diabetes mellitus with other specified complication: Secondary | ICD-10-CM

## 2022-05-26 DIAGNOSIS — M79642 Pain in left hand: Secondary | ICD-10-CM

## 2022-05-26 DIAGNOSIS — M1812 Unilateral primary osteoarthritis of first carpometacarpal joint, left hand: Secondary | ICD-10-CM | POA: Diagnosis not present

## 2022-05-26 MED ORDER — SEMAGLUTIDE (1 MG/DOSE) 4 MG/3ML ~~LOC~~ SOPN: 1.0000 mg | PEN_INJECTOR | SUBCUTANEOUS | 0 refills | Status: DC

## 2022-05-26 MED ORDER — SEMAGLUTIDE (2 MG/DOSE) 8 MG/3ML ~~LOC~~ SOPN: 2.0000 mg | PEN_INJECTOR | SUBCUTANEOUS | 11 refills | Status: DC

## 2022-05-26 MED ORDER — OZEMPIC (0.25 OR 0.5 MG/DOSE) 2 MG/3ML ~~LOC~~ SOPN: PEN_INJECTOR | SUBCUTANEOUS | 0 refills | Status: DC

## 2022-05-26 NOTE — Patient Instructions (Addendum)
We will check the x-ray today.  We have sent in ozempic to take 0.25 mg weekly injected for 1 month, then increase to 0.5 mg weekly for 1 month. Then increase to 1 mg weekly for 1 month. Then increase to 2 mg weekly and stay at that dose.

## 2022-05-26 NOTE — Progress Notes (Signed)
   Subjective:   Patient ID: Tanner [name], male    DOB: 08/15/1945, 76 y.o.   MRN: [id number]  HPI The patient is a 75 YO man coming in for finger pain and fall.   Review of Systems  Constitutional: Negative.   HENT: Negative.    Eyes: Negative.   Respiratory:  Negative for cough, chest tightness and shortness of breath.   Cardiovascular:  Negative for chest pain, palpitations and leg swelling.  Gastrointestinal:  Negative for abdominal distention, abdominal pain, constipation, diarrhea, nausea and vomiting.  Musculoskeletal:  Positive for arthralgias, joint swelling and myalgias.  Skin: Negative.   Neurological: Negative.   Psychiatric/Behavioral: Negative.      Objective:  Physical Exam Constitutional:      Appearance: He is well-developed. He is obese.  HENT:     Head: Normocephalic and atraumatic.  Cardiovascular:     Rate and Rhythm: Normal rate and regular rhythm.  Pulmonary:     Effort: Pulmonary effort is normal. No respiratory distress.     Breath sounds: Normal breath sounds. No wheezing or rales.  Abdominal:     General: Bowel sounds are normal. There is no distension.     Palpations: Abdomen is soft.     Tenderness: There is no abdominal tenderness. There is no rebound.  Musculoskeletal:        General: Tenderness present.     Cervical back: Normal range of motion.  Skin:    General: Skin is warm and dry.  Neurological:     Mental Status: He is alert and oriented to person, place, and time.     Coordination: Coordination abnormal.     Vitals:   05/26/22 1356 05/26/22 1405  BP: (!) 160/80 (!) 160/80  Pulse: 76   Temp: 97.9 F (36.6 C)   TempSrc: Oral   SpO2: 93%   Weight: 263 lb (119.3 kg)   Height: '5\' 6"'$  (1.676 m)     Assessment & Plan:

## 2022-05-26 NOTE — PT Treatment (Signed)
[address], [place], [place], [address]  ([phone]   (Fax) [phone]    Outpatient Rehabilitation Services  Physical Therapy Treatment Note    Date: 05/26/2022    Patient Name: Thomas [name]  Date of Birth: 1946/04/09  MRN: [id number]  Payor: Payor: HUMANA MEDICARE / Plan: Francine Graven CHOICE PPO / Product Type: PPO /   Referring Physician: Rhoderick Moody MD   Diagnosis:   Bilateral posterior neck pain [M54.2]  - Primary      Degenerative disc disease, cervical [M50.30]         Certification of Plan From: 05/13/22  To: 08/10/22    Visit #: 5     Subjective   Patient presents to OP PT treatment with chief complaint of mild pain on right side of cervical spine when turning head to the right, but notes it is getting better.    Today's pain 3/10    Objective   Modalities x 10 minutes   MH to B- cervical / thoracic paraspinals and UT to prep tissues and decrease hypertonicity      Manual therapy x 15 minutes   STM to cervical and thoracic paraspinals, upper trap, LS, rhomboids, middle trap, scalenes, SCM, and suboccipital musculature   Gentle manual traction, SOR   PROM into cervical flexion/ extension, B SB, and B rot     Therapeutic exercise x 25 minutes   UT stretch 20 x 5 seconds   Levator Scap stretch 10 x 5 seconds   B scapular retraction x 20  AROM cervical flex/ext, B rot x 20   Posterior shoulder rolls x 20   Rows blue t-band x 20   blue t-band lower trap pull backs x 20  Blue t-band B shoulder ER x 20  Standing shoulder flexion 1# x 20  Standing shoulder abduction 1# x 20  Cable column rows (NV)            Start Time: 1:25 pm  End time: 2:19 pm        Assessment   Patient feeling better and has improved cervical spine mobility following manual therapy techniques today.      Plan   Next session add cable column rows.    The risks/benefits of therapy have been discussed with the patient and he/she is in agreement with the established plan of care.    Therapist   Sunday Corn, PT

## 2022-05-28 ENCOUNTER — Encounter (HOSPITAL_BASED_OUTPATIENT_CLINIC_OR_DEPARTMENT_OTHER): Payer: Self-pay

## 2022-05-28 ENCOUNTER — Emergency Department (HOSPITAL_BASED_OUTPATIENT_CLINIC_OR_DEPARTMENT_OTHER)
Admission: EM | Admit: 2022-05-28 | Discharge: 2022-05-28 | Disposition: A | Discharge status: Home / Self Care | Payer: Medicare HMO | Attending: Emergency Medicine | Admitting: Emergency Medicine

## 2022-05-28 ENCOUNTER — Emergency Department (HOSPITAL_BASED_OUTPATIENT_CLINIC_OR_DEPARTMENT_OTHER): Payer: Medicare HMO

## 2022-05-28 ENCOUNTER — Ambulatory Visit (HOSPITAL_COMMUNITY)
Admission: RE | Admit: 2022-05-28 | Discharge: 2022-05-28 | Disposition: A | Discharge status: Still In Hospital | Payer: Commercial Managed Care - PPO | Source: Ambulatory Visit

## 2022-05-28 ENCOUNTER — Other Ambulatory Visit: Payer: Self-pay

## 2022-05-28 DIAGNOSIS — R6884 Jaw pain: Secondary | ICD-10-CM | POA: Insufficient documentation

## 2022-05-28 DIAGNOSIS — Z7901 Long term (current) use of anticoagulants: Secondary | ICD-10-CM | POA: Diagnosis not present

## 2022-05-28 DIAGNOSIS — R519 Headache, unspecified: Secondary | ICD-10-CM | POA: Diagnosis not present

## 2022-05-28 DIAGNOSIS — Z1152 Encounter for screening for COVID-19: Secondary | ICD-10-CM | POA: Diagnosis not present

## 2022-05-28 LAB — CBC WITH DIFFERENTIAL/PLATELET
Abs Immature Granulocytes: 0.02 10*3/uL (ref 0.00–0.07)
Basophils Absolute: 0.1 10*3/uL (ref 0.0–0.1)
Basophils Relative: 1 %
Eosinophils Absolute: 0.1 10*3/uL (ref 0.0–0.5)
Eosinophils Relative: 2 %
HCT: 48.2 % (ref 39.0–52.0)
Hemoglobin: 15.7 g/dL (ref 13.0–17.0)
Immature Granulocytes: 0 %
Lymphocytes Relative: 22 %
Lymphs Abs: 1.6 10*3/uL (ref 0.7–4.0)
MCH: 28.1 pg (ref 26.0–34.0)
MCHC: 32.6 g/dL (ref 30.0–36.0)
MCV: 86.4 fL (ref 80.0–100.0)
Monocytes Absolute: 0.4 10*3/uL (ref 0.1–1.0)
Monocytes Relative: 6 %
Neutro Abs: 5.1 10*3/uL (ref 1.7–7.7)
Neutrophils Relative %: 69 %
Platelets: 176 10*3/uL (ref 150–400)
RBC: 5.58 MIL/uL (ref 4.22–5.81)
RDW: 14.8 % (ref 11.5–15.5)
WBC: 7.2 10*3/uL (ref 4.0–10.5)
nRBC: 0 % (ref 0.0–0.2)

## 2022-05-28 LAB — RESP PANEL BY RT-PCR (RSV, FLU A&B, COVID)  RVPGX2
Influenza A by PCR: NEGATIVE
Influenza B by PCR: NEGATIVE
Resp Syncytial Virus by PCR: NEGATIVE
SARS Coronavirus 2 by RT PCR: NEGATIVE

## 2022-05-28 LAB — TROPONIN I (HIGH SENSITIVITY): Troponin I (High Sensitivity): 8 ng/L (ref <18)

## 2022-05-28 LAB — BASIC METABOLIC PANEL
Anion gap: 14 (ref 5–15)
BUN: 17 mg/dL (ref 8–23)
CO2: 22 mmol/L (ref 22–32)
Calcium: 9.1 mg/dL (ref 8.9–10.3)
Chloride: 101 mmol/L (ref 98–111)
Creatinine, Ser: 1.34 mg/dL — ABNORMAL HIGH (ref 0.61–1.24)
GFR, Estimated: 55 mL/min — ABNORMAL LOW (ref >60)
Glucose, Bld: 66 mg/dL — ABNORMAL LOW (ref 70–99)
Potassium: 4.2 mmol/L (ref 3.5–5.1)
Sodium: 137 mmol/L (ref 135–145)

## 2022-05-28 MED ORDER — AMOXICILLIN-POT CLAVULANATE 875-125 MG PO TABS: 1.0000 | ORAL_TABLET | Freq: Two times a day (BID) | ORAL | 0 refills | Status: DC

## 2022-05-28 MED ORDER — IOHEXOL 300 MG/ML  SOLN
100.0000 mL | Freq: Once | INTRAMUSCULAR | Status: AC | PRN
  Administered 2022-05-28: 75 mL via INTRAVENOUS

## 2022-05-28 MED ORDER — AMOXICILLIN-POT CLAVULANATE 875-125 MG PO TABS
1.0000 | ORAL_TABLET | Freq: Once | ORAL | Status: AC
  Administered 2022-05-28: 1 via ORAL
  Filled 2022-05-28: qty 1

## 2022-05-28 NOTE — ED Provider Notes (Signed)
[facility] DEPT Provider Note   CSN: [id number] Arrival date & time: 05/28/22  1546     History  Chief Complaint  Patient presents with   Jaw Pain    Tanner [name] is a 76 y.o. male.  Patient with left jaw pain for the past 3 days that is fairly constant.  He is edentulous.  Denies any fall or injury.  Denies any fevers, chills, nausea or vomiting.  Denies any chest pain or shortness of breath.  States the pain is constant on his left jaw and sometimes worse with swallowing.  Taking Tylenol with partial relief.  No fever.  No drainage or bleeding in his mouth.  He is anticoagulated on Eliquis.  Remote history of pulmonary embolism.  Also with history of hypertension and diabetes.  Denies any cardiac history.  Denies any foul-smelling drainage to his mouth or bleeding to his mouth.  The history is provided by the patient.       Home Medications Prior to Admission medications   Medication Sig Start Date End Date Taking? Authorizing Provider  acetaminophen (TYLENOL) 500 MG tablet Take 1,000 mg by mouth every 6 (six) hours as needed for moderate pain. 02/21/13   Norins, Heinz Knuckles, MD  acidophilus (RISAQUAD) CAPS capsule Take 1 capsule by mouth daily.    [provider]  apixaban (ELIQUIS) 5 MG TABS tablet Take 5 mg by mouth 2 (two) times daily.    [provider]  atenolol (TENORMIN) 25 MG tablet Take 25 mg by mouth daily.    [provider]  atorvastatin (LIPITOR) 80 MG tablet Take 40 mg by mouth daily.    [provider]  buPROPion (WELLBUTRIN SR) 150 MG 12 hr tablet Take 150 mg by mouth 2 (two) times daily.    [provider]  clobetasol ointment (TEMOVATE) 0.05 % APPLY SMALL AMOUNT TOPICALLY EVERY DAY AS NEEDED DO NOT USE ON FACE OR GENITALS 09/15/21   [provider]  finasteride (PROSCAR) 5 MG tablet Take 5 mg by mouth at bedtime.    [provider]  gabapentin (NEURONTIN) 300 MG capsule Take  300-600 mg by mouth See admin instructions. Take 600 mg by mouth three times daily and  300 mg at night    [provider]  ketoconazole (NIZORAL) 2 % shampoo Apply 1 application topically every other day.    [provider]  Menthol-Methyl Salicylate (THERA-GESIC PLUS) CREA Apply 1 application topically at bedtime. Apply to lower back    [provider]  Multiple Vitamins-Minerals (MULTIVITAMINS THER. W/MINERALS) TABS Take 1 tablet by mouth daily.    [provider]  nortriptyline (PAMELOR) 10 MG capsule 2 CAPSULES EVERY NIGHT AT BEDTIME 12/20/17   Patel, Donika K, DO  saccharomyces boulardii (FLORASTOR) 250 MG capsule Take 1 capsule (250 mg total) by mouth 2 (two) times daily. 05/31/17   Lavina Hamman, MD  Semaglutide, 1 MG/DOSE, 4 MG/3ML SOPN Inject 1 mg as directed once a week. 05/26/22   Hoyt Koch, MD  Semaglutide, 2 MG/DOSE, 8 MG/3ML SOPN Inject 2 mg as directed once a week. 05/26/22   Hoyt Koch, MD  Semaglutide,0.25 or 0.'5MG'$ /DOS, (OZEMPIC, 0.25 OR 0.5 MG/DOSE,) 2 MG/3ML SOPN Inject 0.25 mg into the skin once a week for 28 days, THEN 0.5 mg once a week for 28 days. 05/26/22 07/21/22  Hoyt Koch, MD  sildenafil (VIAGRA) 100 MG tablet Take 100 mg by mouth as needed. 02/27/20   [provider]  torsemide (DEMADEX) 20 MG tablet Take 1-2 tablets (20-40 mg total) by mouth daily. 01/26/22   Hoyt Koch, MD      Allergies    Lisinopril, Amlodipine, and Codeine sulfate    Review of Systems   Review of Systems  Constitutional:  Negative for activity change, appetite change and fever.  HENT:  Positive for dental problem.   Respiratory:  Negative for cough, chest tightness and shortness of breath.   Cardiovascular:  Negative for chest pain.  Gastrointestinal:  Negative for abdominal pain, nausea and vomiting.  Genitourinary:  Negative for dysuria.  Musculoskeletal:  Negative for arthralgias and myalgias.   Neurological:  Negative for weakness and headaches.   all other systems are negative except as noted in the HPI and PMH.    Physical Exam Updated Vital Signs BP (!) 180/70 (BP Location: Right Arm)   Pulse 80   Temp 98.1 F (36.7 C)   Resp 16   Ht '5\' 6"'$  (1.676 m)   Wt 119.3 kg   SpO2 97%   BMI 42.45 kg/m  Physical Exam Vitals and nursing note reviewed.  Constitutional:      General: He is not in acute distress.    Appearance: He is well-developed.  HENT:     Head: Normocephalic and atraumatic.     Mouth/Throat:     Pharynx: No oropharyngeal exudate.     Comments: Edentulous.  Floor mouth is soft.  No drainage or fluctuance appreciated.  There is tenderness across lower submandibular area without fluctuance or erythema.  Controlling secretions, no distress, no stridor Eyes:     Conjunctiva/sclera: Conjunctivae normal.     Pupils: Pupils are equal, round, and reactive to light.  Neck:     Comments: No meningismus. Cardiovascular:     Rate and Rhythm: Normal rate and regular rhythm.     Heart sounds: Normal heart sounds. No murmur heard. Pulmonary:     Effort: Pulmonary effort is normal. No respiratory distress.     Breath sounds: Normal breath sounds.  Abdominal:     Palpations: Abdomen is soft.     Tenderness: There is no abdominal tenderness. There is no guarding or rebound.  Musculoskeletal:        General: No tenderness. Normal range of motion.     Cervical back: Normal range of motion and neck supple.  Skin:    General: Skin is warm.  Neurological:     Mental Status: He is alert and oriented to person, place, and time.     Cranial Nerves: No cranial nerve deficit.     Motor: No abnormal muscle tone.     Coordination: Coordination normal.     Comments: No ataxia on finger to nose bilaterally. No pronator drift. 5/5 strength throughout. CN 2-12 intact.Equal grip strength. Sensation intact.   Psychiatric:        Behavior: Behavior normal.     ED Results /  Procedures / Treatments   Labs (all labs ordered are listed, but only abnormal results are displayed) Labs Reviewed  BASIC METABOLIC PANEL - Abnormal; Notable for the following components:      Result Value   Glucose, Bld 66 (*)    Creatinine, Ser 1.34 (*)    GFR, Estimated 55 (*)    All other components within normal limits  RESP PANEL BY RT-PCR (RSV, FLU A&B, COVID)  [id number]  CBC WITH DIFFERENTIAL/PLATELET  TROPONIN I (HIGH SENSITIVITY)  TROPONIN I (HIGH SENSITIVITY)  EKG EKG Interpretation  Date/Time:  Thursday May 28 2022 16:26:57 EST Ventricular Rate:  74 PR Interval:  212 QRS Duration: 172 QT Interval:  452 QTC Calculation: 501 R Axis:   -62 Text Interpretation: Sinus rhythm with 1st degree A-V block with occasional Premature ventricular complexes Right bundle branch block Left anterior fascicular block * Bifascicular block * Minimal voltage criteria for LVH, may be normal variant ( R in aVL ) Septal infarct , age undetermined Abnormal ECG When compared with ECG of 04-May-2022 10:03, PREVIOUS ECG IS PRESENT No significant change was found Confirmed by Ezequiel Essex [phone]) on 05/28/2022 4:47:58 PM  Radiology CT Maxillofacial W Contrast  Result Date: 05/28/2022 CLINICAL DATA:  Left facial pain EXAM: CT MAXILLOFACIAL WITH CONTRAST TECHNIQUE: Multidetector CT imaging of the maxillofacial structures was performed with intravenous contrast. Multiplanar CT image reconstructions were also generated. RADIATION DOSE REDUCTION: This exam was performed according to the departmental dose-optimization program which includes automated exposure control, adjustment of the mA and/or kV according to patient size and/or use of iterative reconstruction technique. CONTRAST:  50m OMNIPAQUE IOHEXOL 300 MG/ML  SOLN COMPARISON:  None Available. FINDINGS: Osseous: No facial or mandibular fracture. Orbits: The globes are intact. Normal appearance of the intra- and extraconal fat. Symmetric  extraocular muscles. Sinuses: No fluid levels or advanced mucosal thickening. Soft tissues: Normal visualized extracranial soft tissues. Limited intracranial: Normal. Other: None. IMPRESSION: No acute abnormality of the face. Electronically Signed   By: KUlyses JarredM.D.   On: 05/28/2022 22:07    Procedures Procedures    Medications Ordered in ED Medications - No data to display  ED Course/ Medical Decision Making/ A&P                           Medical Decision Making Amount and/or Complexity of Data Reviewed Labs: ordered. Decision-making details documented in ED Course. Radiology: ordered and independent interpretation performed. Decision-making details documented in ED Course. ECG/medicine tests: ordered and independent interpretation performed. Decision-making details documented in ED Course.  Risk Prescription drug management.   Left jaw pain for the past 2 days.  Vital stable, no distress, no increased work of breathing.  Labs reassuring.  EKG is sinus rhythm.  Low suspicion for ACS equivalent.  No chest pain or shortness of breath. Troponin negative in setting of 2 days of constant pain.  No obvious dental abscess.  Consider sialodenitis. CT scan is obtained.  This shows no drainable fluid collection or abscess.  No evidence of cellulitis.  Will treat for suspected dental infection versus sialoadenitis  Empiric antibiotics, sialagogues, anti-inflammatories, PCP follow-up.  Return to the ED with new or worsening symptoms.       Final Clinical Impression(s) / ED Diagnoses Final diagnoses:  Jaw pain    Rx / DC Orders ED Discharge Orders     None         Maliki Gignac, SAnnie Main MD 05/28/22 2307

## 2022-05-28 NOTE — ED Triage Notes (Signed)
Patient here POV from Home.  Endorses Left Sided Jaw Pain and Swelling that began a few days ago. Painful to Chew and Swallow.   No other associated symptoms or Fevers.   NAD Noted during Triage. A&Ox4. GCS 15. Ambulatory.

## 2022-05-28 NOTE — Discharge Instructions (Signed)
Take the antibiotics as prescribed.  You may suck on sour candies to increase saliva flow.  Follow-up with your primary doctor.  Return to the ED with difficulty breathing, difficulty swallowing, other concerns

## 2022-05-28 NOTE — PT Treatment (Signed)
[address] [place], [place], [address]  ([phone]   (Fax) [phone]    Outpatient Rehabilitation Services  Physical Therapy Treatment Note    Date: 05/28/2022    Patient Name: Thomas [name]  Date of Birth: [birth year]  MRN: [id number]  Payor: Payor: [employer] MEDICARE / Plan: Francine Graven CHOICE PPO / Product Type: PPO /   Referring Physician: Rhoderick Moody MD   Diagnosis:   Bilateral posterior neck pain [M54.2]  - Primary      Degenerative disc disease, cervical [M50.30]         Certification of Plan From: 05/13/22  To: 08/10/22    Visit #: 6     Subjective   Patient presents to OP PT treatment with chief complaint of cervical spine pain when lifting or turning his head.    Today's pain 2/10    Objective   Modalities x 10 minutes   MH to B cervical / thoracic paraspinals and UT to prep tissues and decrease hypertonicity      Manual therapy x 15 minutes   STM to cervical and thoracic paraspinals, upper trap, LS, rhomboids, middle trap, scalenes, SCM, and suboccipital musculature   Gentle manual traction, SOR   PROM into cervical flexion/ extension, B SB, and B rot     Therapeutic exercise x 25 minutes   UT stretch 20 x 5 seconds   Levator Scap stretch 10 x 5 seconds   B scapular retraction x 20  AROM cervical flex/ext, B rot x 20   Posterior shoulder rolls x 20   Rows blue t-band x 20   blue t-band lower trap pull backs x 20  Blue t-band B shoulder ER x 20  Standing shoulder flexion 1# x 20  Standing shoulder abduction 1# x 20  Cable column rows 18# x 20      Start Time: 1:25 pm  End time: 2:20 pm        Assessment   Patient is feeling better with less frequent and less intense symptoms now. He shows improved sitting posture as well, but occasionally needs cueing.      Plan   Continue to progress strength of postural, scapular, and spinal musculature.    The risks/benefits of therapy have been discussed with the patient and he/she is in agreement with the established plan of care.    Therapist   Sunday Corn,  PT

## 2022-05-29 ENCOUNTER — Encounter: Payer: Self-pay | Admitting: Internal Medicine

## 2022-05-29 DIAGNOSIS — M79642 Pain in left hand: Secondary | ICD-10-CM | POA: Insufficient documentation

## 2022-05-29 NOTE — Assessment & Plan Note (Signed)
Elevated due to not taking fluid pill and other BP meds prior to visit. Previously with meds and at home BP at goal. Continue atenolol 25 mg daily and torsemide 20-40 mg daily.

## 2022-05-29 NOTE — Assessment & Plan Note (Signed)
Rx ozempic for concurrent morbid obesity and diabetes to help. He is needing joint replacement and weight is too high currently for this.

## 2022-05-29 NOTE — Assessment & Plan Note (Signed)
With some swelling 3rd finger. Ordered x-ray hand to assess for occult fracture. Fall about 1 month ago and painful since that time.

## 2022-05-29 NOTE — Assessment & Plan Note (Signed)
Rx ozempic standard titration with follow up 2-3 months. He is needing weight loss to help with qualifying for joint replacement surgery. He will continue on statin.

## 2022-06-02 ENCOUNTER — Other Ambulatory Visit: Payer: Self-pay

## 2022-06-02 ENCOUNTER — Ambulatory Visit (INDEPENDENT_AMBULATORY_CARE_PROVIDER_SITE_OTHER): Payer: Commercial Managed Care - PPO | Admitting: Family Medicine

## 2022-06-02 ENCOUNTER — Encounter (INDEPENDENT_AMBULATORY_CARE_PROVIDER_SITE_OTHER): Payer: Self-pay | Admitting: Family Medicine

## 2022-06-02 ENCOUNTER — Ambulatory Visit (HOSPITAL_COMMUNITY)
Admission: RE | Admit: 2022-06-02 | Discharge: 2022-06-02 | Disposition: A | Discharge status: Still In Hospital | Payer: Commercial Managed Care - PPO | Source: Ambulatory Visit | Attending: Family Medicine | Admitting: Family Medicine

## 2022-06-02 VITALS — BP 132/76 | HR 57 | Ht 71.0 in | Wt 240.4 lb

## 2022-06-02 DIAGNOSIS — I10 Essential (primary) hypertension: Secondary | ICD-10-CM

## 2022-06-02 DIAGNOSIS — M5030 Other cervical disc degeneration, unspecified cervical region: Secondary | ICD-10-CM

## 2022-06-02 DIAGNOSIS — M542 Cervicalgia: Secondary | ICD-10-CM

## 2022-06-02 DIAGNOSIS — T464X5A Adverse effect of angiotensin-converting-enzyme inhibitors, initial encounter: Secondary | ICD-10-CM

## 2022-06-02 DIAGNOSIS — Z0000 Encounter for general adult medical examination without abnormal findings: Secondary | ICD-10-CM

## 2022-06-02 DIAGNOSIS — R058 Other specified cough: Secondary | ICD-10-CM

## 2022-06-02 MED ORDER — SHINGRIX (PF) 50 MCG/0.5 ML INTRAMUSCULAR SUSPENSION, KIT
0.5000 mL | INHALATION_SUSPENSION | Freq: Once | INTRAMUSCULAR | 0 refills | Status: AC
Start: 2022-06-02 — End: 2022-06-02

## 2022-06-02 MED ORDER — LOSARTAN 50 MG TABLET
50.0000 mg | ORAL_TABLET | Freq: Every day | ORAL | 3 refills | Status: DC
Start: 2022-06-02 — End: 2023-06-14

## 2022-06-02 NOTE — Nursing Note (Signed)
06/02/22 1300   Medicare Wellness Assessment   Medicare initial or wellness physical in the last year? Yes   Advance Directives   Does patient have a living will or MPOA no   Has patient provided [employer] with a copy? no   Advance directive information given to the patient today? no   Activities of Daily Living   Do you need help with dressing, bathing, or walking? No   Do you need help with shopping, housekeeping, medications, or finances? No   Do you have rugs in hallways, broken steps, or poor lighting? No   Do you have grab bars in your bathroom, non-slip strips in your tub, and hand rails on your stairs? No   Urinary Incontinence Screen   Do you ever leak urine when you don't want to? YES   Cognitive Function Screen   What is you age? 1   What is the time to the nearest hour? 1   What is the year? 1   What is the name of this clinic? 1   Can the patient recognize two persons (the doctor, the nurse, home help, etc.)? 1   What is the date of your birth? (day and month sufficient)  1   In what year did World War II end? 1   Who is the current president of the [place]? 1   Count from 20 down to 1? 1   What address did I give you earlier? 1   Total Score 10   Depression Screen   Little interest or pleasure in doing things. 0   Feeling down, depressed, or hopeless 0   PHQ 2 Total 0   Pain Score   Pain Score Two   Substance Use Screening   In Past 12 MONTHS, how often have you used any tobacco product (for example, cigarettes, e-cigarettes, cigars, pipes, or smokeless tobacco)? Daily   In the PAST 3 MONTHS, did you smoke a cigarette containing tobacco or use any other nicotine delivery product (i.e., e-cigarette, vaping or chewing tobacco)? Yes   In the PAST 3 MONTHS, did you usually smoke more than 10 cigarettes, vape, use an e-cigarette or chew tobacco more than 10 times each day? No   In the PAST 3 MONTHS, did you usually smoke/use an e-cigarette, vape or chew tobacco within 30 minutes after waking?  Yes   In the PAST 12 MONTHS, how often have you had 5 (men)/4 (women) or more drinks containing alcohol in one day? Never   In the PAST 12 months, how often have you used any prescription medications just for the feeling, more than prescribed, or that were not prescribed for you? Prescriptions may include: opioids, benzodiazepines, medications for ADHD Never   In the PAST 12 MONTHS, how often have you used any drugs, including marijuana, cocaine or crack, heroin, methamphetamine, hallucinogens, ecstasy/MDMA? Never   Hearing Screen   Have you noticed any hearing difficulties? No   After whispering 9-1-6 how many numbers did the patient repeat correctly? 3   Fall Risk Assessment   Do you feel unsteady when standing or walking? No   Do you worry about falling? No   Have you fallen in the past year? No   Vision Screen   Right Eye = 20 25   Left Eye = 20 20

## 2022-06-02 NOTE — Patient Instructions (Signed)
Medicare Preventive Services  Medicare coverage information Recommendation for YOU   Heart Disease and Diabetes   Lipid profile every 5 years or more often if at risk for cardiovascular disease  Last Lipid Panel  (Last result in the past 2 years)      Cholesterol   HDL   LDL   Direct LDL   Triglycerides        05/01/22 0744 137   53   70  Comment: <100 mg/dL, Optimal  161-096 mg/dL, Near/Above Optimal  045-409 mg/dL, Borderline High  811-914 mg/dL, High  >=782 mg/dL, Very high     70            Diabetes Screening with Blood Glucose test or Glucose Tolerance Test  yearly for those at risk for diabetes, up to two tests per year for those with prediabetes  Last Glucose: 96     Diabetes Self-Management Training initial training ten hours per year, and follow-up training two hours per subsequent year. Optional for those with diabetes    Medical Nutrition Therapy three hours of one-on-one counseling in first year, two hours in subsequent years. Optional for those with diabetes, kidney disease   Intensive Behavioral Therapy for Obesity  Face-to-face counseling, first month every week, month 2-6 every other week, month 7-12 every month if continued progress is documented Optional for those with Body Mass Index 30 or higher  Your Body mass index is 33.53 kg/m.   Tobacco Cessation (Quitting) Counseling   Two attempts per year, max 4 sessions per attempt, up to 8 sessions per year Optional for those who use tobacco    Cancer Screening   Colorectal screening   For anyone age 98 to 28 or any age if high risk:  Screening Colonoscopy every 10 yrs if low risk,  more frequent if higher risk  OR  Cologuard Stool DNA test once every 3 years OR  Fecal Occult Blood Testing yearly OR  Flexible  Sigmoidoscopy  every 5 yr OR  CT Colonography every 5 yrs  See Your Schedule below   Prostate Cancer Screening  Prostate Specific Antigen blood test based on joint decision making with your provider for ages 74-69  A joint decision between you  and your primary care provider   Abdominal aortic aneurysm screening  One time screening for men 68 or older who have smoked more than 100 cigarettes in their lifetime.  See Your Schedule below   Lung Cancer Screening  Annual low dose computed tomography (LDCT scan) is recommended for those age 57-77 who smoked 20 pack-years and are current smokers or quit smoking within past 15 years (one pack-year= smoking one PPD for one year), after counseling by your doctor or nurse clinician about the possible benefits or harms. See Your Schedule below   Vaccinations   Pneumococcal Vaccine: Recommended routinely age 37+ with one or two separate vaccines based on your risk    Recommended before age 70 if medical conditions with increased risk  Seasonal Influenza Vaccine: Once every flu season   Hepatitis B Vaccine: 3 doses if risk (including anyone with diabetes or liver disease)  Shingles Vaccine: Two doses at age 30 or older  Diphtheria Tetanus Pertussis Vaccine: ONCE as adult, booster every 10 years     Immunization History   Administered Date(s) Administered   . Covid-19 Vaccine,Pfizer Bivalent,25mcg/0.3ml,12 yrs+ 04/01/2021   . Covid-19 Vaccine,Pfizer-BioNTech,Purple Top,85yrs+ 08/04/2019, 08/25/2019, 05/08/2020, 10/01/2020   . Diptheria & Tetanus Toxoid,Adsorbed, (Decavac/Tenivac) 46YRS & OLDER 05/25/2016   .  High-Dose Influenza Vaccine, 65+ 05/25/2016, 03/02/2019   . INFLUENZA VIRUS VACCINE (ADMIN) 05/25/2016, 03/26/2017, 05/07/2018   . Influenza Vaccine, 6 month-adult 05/07/2018, 03/29/2020, 04/01/2021   . Influenza Vaccine, 65+ 03/29/2020, 04/01/2022   . PREVNAR 13 01/22/2016, 05/24/2018   . Pneumovax 07/14/2013     Shingles vaccine and Diphtheria Tetanus Pertussis vaccines are available at pharmacies or local health department without a prescription.   Other Screening   Glaucoma Screening   yearly if in high risk group such as diabetes, family history, African American age 71+ or Hispanic American age 66+ See your Eye  Care Provider   Hepatitis C Screening recommended ONCE for those born between 1945-1965, or high risk for HCV infection  See Your Schedule below   HIV Testing recommended routinely at least ONCE, covered every year for age 53 to 20 regardless of risk, and every year for age over 47 who ask for the test or higher risk See Your Schedule belowAbdominal Aortic Aneurysm Screening Ultrasound Recommended ONCE for any male who smoked 100 cigarettes/lifetime OR with family history of aortic aneurysm       Your Personalized Schedule for Preventive Tests   Health Maintenance: Pending and Last Completed       Date Due Completion Date    Shingles Vaccine (1 of 2) Never done ---    Covid-19 Vaccine (6 - 2023-24 season) 02/13/2022 04/01/2021    Depression Screening 06/03/2023 06/02/2022    Medicare Annual Wellness Visit 06/03/2023 06/02/2022    Adult Tdap-Td (1 - Tdap) 05/25/2026 05/25/2016

## 2022-06-02 NOTE — Progress Notes (Signed)
PRIMARY CARE, [facility]  [address]  [place] [address]    Medicare Annual Wellness Visit    Name: Thomas [name] MRN:  [id number]   Date: 06/02/2022 Age: 76 y.o.       SUBJECTIVE:   Thomas [name] is a 76 y.o. male for presenting for Medicare Wellness exam.   I have reviewed and reconciled the medication list with the patient today.    He was here for his regular follow-up of chronic medical problems 2 months ago and was complaining of a chronic cough.  We did stop his ACE inhibitor and switched him to losartan 50 mg. The cough has decreased, almost completely gone.  Blood pressure has been well controlled.  He does not have any other pulmonary symptoms.  Also he had been complaining of pain in the back of his neck radiating into the right upper trapezius area and shoulder.  He did have x-rays showing some degenerative disc disease of the cervical spine and he was referred to physical therapy.  Gone to a few sessions of physical therapy so far and it started about better.  He denies any numbness or tingling in his hands or weakness of his hands.    Most recent Cologuard, 10/31/2020, negative     Comprehensive Health Assessment:  Paper document COMPREHENSIVE HEALTH ASSESSMENT reviewed and scanned into medical record    I have reviewed and updated as appropriate the past medical, family and social history. 06/02/2022 as summarized below:  Past Medical History:   Diagnosis Date    Arthritis     Dysuria     Enteritis due to Rotavirus     Gout     Headache     Hemorrhoid     Hypercholesterolemia     Hypertension      Past Surgical History:   Procedure Laterality Date    Hx hemorrhoidectomy       Current Outpatient Medications   Medication Sig    acetaminophen (TYLENOL) 500 mg Oral Tablet Take 1 Tablet (500 mg total) by mouth Every 4 hours as needed for Pain (pt takes tylenol pm)    apixaban (ELIQUIS) 5 mg Oral Tablet Take 1 Tablet (5 mg total) by mouth Twice daily    diltiazem HCl  (TIAZAC) 240 mg Oral Capsule,Sustained Action 24 hr TAKE ONE CAPSULE BY MOUTH EVERY DAY    flecainide (TAMBOCOR) 100 mg Oral Tablet TAKE ONE TABLET BY MOUTH TWICE DAILY    furosemide (LASIX) 40 mg Oral Tablet TAKE ONE TABLET BY MOUTH EVERY DAY    losartan (COZAAR) 50 mg Oral Tablet Take 1 Tablet (50 mg total) by mouth Once a day    nitroGLYCERIN (NITROSTAT) 0.4 mg Sublingual Tablet, Sublingual Place 1 Tablet (0.4 mg total) under the tongue Every 5 minutes as needed for Chest pain    potassium chloride (KLOR-CON) 10 mEq Oral Tablet Sustained Release TAKE ONE TABLET BY MOUTH EVERY DAY    simvastatin (ZOCOR) 20 mg Oral Tablet Take 1 Tablet (20 mg total) by mouth Every night    SPIRIVA WITH HANDIHALER 18 mcg Inhalation Capsule, w/Inhalation Device Take 1 Capsule (18 mcg total) by inhalation Once a day    tamsulosin (FLOMAX) 0.4 mg Oral Capsule Take 1 Capsule (0.4 mg total) by mouth Every evening after dinner    varicella-zoster, PF, (SHINGRIX, PF,) 50 mcg/0.5 mL IntraMUSCULAR Suspension for Reconstitution Inject 0.5 mL into the muscle One time for 1 dose     Family Medical History:  Problem Relation (Age of Onset)    Cancer Mother    Hypertension (High Blood Pressure) Mother, Father            Social History     Socioeconomic History    Marital status: Widowed   Tobacco Use    Smoking status: Former     Types: Cigars    Smokeless tobacco: Former     Types: Chew, Snuff   Vaping Use    Vaping Use: Never used   Substance and Sexual Activity    Alcohol use: Yes     Comment: occasional    Drug use: Never    Sexual activity: Not Currently     Social Determinants of Health     Financial Resource Strain: Unknown (11/11/2021)    Financial Resource Strain     SDOH Financial: Patient chooses not to answer   Transportation Needs: Unknown (11/11/2021)    Transportation Needs     SDOH Transportation: Patient chooses not to answer.   Social Connections: Unknown (11/11/2021)    Social Connections     SDOH Social Isolation: Patient  chooses not to answer   Intimate Partner Violence: Unknown (11/11/2021)    Intimate Partner Violence     SDOH Domestic Violence: Patient chooses not to answer.   Housing Stability: Unknown (11/11/2021)    Housing Stability     SDOH Housing Situation: Patient chooses not to answer.     SDOH Housing Worry: Patient chooses not to answer.   Health Literacy: Unknown (11/11/2021)    Health Literacy     SDOH Health Literacy: Patient chooses not to answer.   Employment Status: Unknown (11/11/2021)    Employment Status     SDOH Employment: Patient chooses not to answer.         List of Current Health Care Providers   Care Team       PCP       Name Type Specialty Phone Number    Rhoderick Moody, DO Physician [facility] [phone]              Care Team       No care team found                      Health Maintenance   Topic Date Due    Shingles Vaccine (1 of 2) Never done    Covid-19 Vaccine (6 - 2023-24 season) 02/13/2022    Depression Screening  06/03/2023    Medicare Annual Wellness Visit  06/03/2023    Adult Tdap-Td (1 - Tdap) 05/25/2026    Hepatitis C screening  Completed    Influenza Vaccine  Completed    Pneumococcal Vaccination, Age 59+  Completed    Meningococcal Vaccine  Aged Out     Medicare Wellness Assessment   Medicare initial or wellness physical in the last year?: Yes  Advance Directives   Does patient have a living will or MPOA: no   Has patient provided [employer] with a copy?: no   Advance directive information given to the patient today?: no      Activities of Daily Living   Do you need help with dressing, bathing, or walking?: No   Do you need help with shopping, housekeeping, medications, or finances?: No   Do you have rugs in hallways, broken steps, or poor lighting?: No   Do you have grab bars in your bathroom, non-slip strips in your tub, and hand rails on your stairs?:  No   Urinary Incontinence Screen   Do you ever leak urine when you don't want to?: YES   Cognitive Function Screen (1=Yes, 0=No)    What is you age?: Correct   What is the time to the nearest hour?: Correct   What is the year?: Correct   What is the name of this clinic?: Correct   Can the patient recognize two persons (the doctor, the nurse, home help, etc.)?: Correct   What is the date of your birth? (day and month sufficient) : Correct   In what year did World War II end?: Correct   Who is the current president of the [place]?: Correct   Count from 20 down to 1?: Correct   What address did I give you earlier?: Correct   Total Score: 10       Hearing Screen   Have you noticed any hearing difficulties?: No  After whispering 9-1-6 how many numbers did the patient repeat correctly?: 3   Fall Risk Screen   Do you feel unsteady when standing or walking?: No  Do you worry about falling?: No  Have you fallen in the past year?: No   Vision Screen   Right Eye = 20: 25   Left Eye = 20: 20   Depression Screen     Little interest or pleasure in doing things.: Not at all  Feeling down, depressed, or hopeless: Not at all  PHQ 2 Total: 0     Pain Score   Pain Score:   2    Substance Use-Abuse Screening     Tobacco Use     In Past 12 MONTHS, how often have you used any tobacco product (for example, cigarettes, e-cigarettes, cigars, pipes, or smokeless tobacco)?: Daily  In the PAST 3 MONTHS, did you smoke a cigarette containing tobacco or use any other nicotine delivery product (i.e., e-cigarette, vaping or chewing tobacco)?: Yes  In the PAST 3 MONTHS, did you usually smoke more than 10 cigarettes, vape, use an e-cigarette or chew tobacco more than 10 times each day?: No  In the PAST 3 MONTHS, did you usually smoke/use an e-cigarette, vape or chew tobacco within 30 minutes after waking?: Yes     Alcohol use     In the PAST 12 MONTHS, how often have you had 5 (men)/4 (women) or more drinks containing alcohol in one day?: Never     Prescription Drug Use     In the PAST 12 months, how often have you used any prescription medications just for the feeling,  more than prescribed, or that were not prescribed for you? Prescriptions may include: opioids, benzodiazepines, medications for ADHD: Never           Illicit Drug Use   In the PAST 12 MONTHS, how often have you used any drugs, including marijuana, cocaine or crack, heroin, methamphetamine, hallucinogens, ecstasy/MDMA?: Never           OBJECTIVE:   BP 132/76   Pulse 57   Ht 1.803 m ( )   Wt 109 kg (240 lb 6 oz)   SpO2 95%   BMI 33.53 kg/m        Physical Exam  Constitutional:       General: He is not in acute distress.     Appearance: Normal appearance.   HENT:      Head: Normocephalic and atraumatic.      Right Ear: Tympanic membrane normal.      Left  Ear: Tympanic membrane normal.      Nose: Nose normal.      Mouth/Throat:      Mouth: Mucous membranes are moist.      Pharynx: Oropharynx is clear. No posterior oropharyngeal erythema.   Eyes:      Extraocular Movements: Extraocular movements intact.      Pupils: Pupils are equal, round, and reactive to light.   Neck:      Vascular: No carotid bruit.   Cardiovascular:      Rate and Rhythm: Normal rate and regular rhythm.      Heart sounds: No murmur heard.  Pulmonary:      Effort: Pulmonary effort is normal.      Breath sounds: Normal breath sounds. No wheezing.   Abdominal:      General: Abdomen is flat. Bowel sounds are normal.      Palpations: Abdomen is soft.      Tenderness: There is no abdominal tenderness.   Musculoskeletal:         General: No swelling, tenderness or signs of injury. Normal range of motion.      Cervical back: Neck supple.   Lymphadenopathy:      Cervical: No cervical adenopathy.   Skin:     General: Skin is warm and dry.      Findings: No rash.   Neurological:      General: No focal deficit present.      Mental Status: He is alert and oriented to person, place, and time.      Cranial Nerves: No cranial nerve deficit.   Psychiatric:         Mood and Affect: Mood normal.         Behavior: Behavior normal.          Health Maintenance  Due   Topic Date Due    Shingles Vaccine (1 of 2) Never done    Covid-19 Vaccine (6 - 2023-24 season) 02/13/2022      ASSESSMENT & PLAN:   Assessment/Plan   1. Medicare annual wellness visit, subsequent -immunizations and health care maintenance up-to-date.  I did recommend shingles vaccine at the pharmacy and also recommended the new COVID 19 vaccination to get at the local pharmacy.   2. Essential hypertension -blood pressure well controlled now on angiotensin receptor blocker.  Will continue this   3. Degenerative disc disease, cervical -continue course of physical therapy.  If he is not noticing significant improvement will consider ordering an MRI.  He is to watch for symptoms such as numbness or weakness of his hand.   4. Bilateral posterior neck pain -continue physical therapy   5. Cough due to ACE inhibitor -ACE inhibitor discontinued and his cough has resolved.      Identified Risk Factors/ Recommended Actions       The PHQ 2 Total: 0 depression screen is interpreted as negative.  Urinary Incontinence Plan of Care:  Flomax and f/u with Urology (he         Orders Placed This Encounter    ALT (SGPT)    AST (SGOT)    BASIC METABOLIC PANEL, FASTING    LIPID PANEL    CBC    losartan (COZAAR) 50 mg Oral Tablet    varicella-zoster, PF, (SHINGRIX, PF,) 50 mcg/0.5 mL IntraMUSCULAR Suspension for Reconstitution          The patient has been educated about risk factors and recommended preventive care. Written Prevention Plan completed/ updated and given to patient (  see After Visit Summary).    Return in about 6 months (around 12/02/2022) for blood work first.    Rhoderick MoodySarah Else Habermann, DO

## 2022-06-02 NOTE — PT Treatment (Signed)
[address], [place], [place], [address]  (Office636 [phone]   (Fax) [phone]    Outpatient Rehabilitation Services  Physical Therapy Treatment Note    Date: 06/02/2022    Patient Name: Thomas [name]  Date of Birth: 05/20/1946  MRN: [id number]  Payor: Payor: [employer] MEDICARE / Plan: Francine Graven CHOICE PPO / Product Type: PPO /   Referring Physician: Rhoderick Moody MD   Diagnosis:   Bilateral posterior neck pain [M54.2]  - Primary      Degenerative disc disease, cervical [M50.30]         Certification of Plan From: 05/13/22  To: 08/10/22    Visit #: 7     Subjective   Patient presents to OP PT treatment with chief complaint of mild increase in cervical spine pain after trimming his tree yesterday.    Today's pain 4/10    Objective   Modalities x 10 minutes   MH to B cervical / thoracic paraspinals and UT to prep tissues and decrease hypertonicity      Manual therapy x 15 minutes   STM to cervical and thoracic paraspinals, upper trap, LS, rhomboids, middle trap, scalenes, SCM, and suboccipital musculature   Gentle manual traction, SOR   PROM into cervical flexion/ extension, B SB, and B rot     Therapeutic exercise x 25 minutes   UT stretch 20 x 5 seconds   Levator Scap stretch 10 x 5 seconds   B scapular retraction x 20  AROM cervical flex/ext, B rot x 20   Posterior shoulder rolls x 20   Rows blue t-band x 20   blue t-band lower trap pull backs x 20  Blue t-band B shoulder ER x 20  Standing shoulder flexion 1# x 20  Standing shoulder abduction 1# x 20  Cable column rows 18# x 20         Start Time: 11:58 am  End time: 12:52 pm        Assessment   Patient is progressing very well and has resumed a lot of his activities with only mild increase in soreness and he is learning how much he can "push it".        Plan   Prepare for DC to HEP after next session if signs and symptoms will allow.    The risks/benefits of therapy have been discussed with the patient and he/she is in agreement with the established plan of  care.    Therapist   Sunday Corn, PT

## 2022-06-04 ENCOUNTER — Encounter (HOSPITAL_COMMUNITY): Payer: Self-pay

## 2022-06-09 ENCOUNTER — Ambulatory Visit
Admission: RE | Admit: 2022-06-09 | Discharge: 2022-06-09 | Disposition: A | Discharge status: Still In Hospital | Payer: Commercial Managed Care - PPO | Source: Ambulatory Visit | Attending: Family Medicine | Admitting: Family Medicine

## 2022-06-09 ENCOUNTER — Other Ambulatory Visit: Payer: Self-pay

## 2022-06-09 NOTE — PT Treatment (Signed)
[address], [place], [place], [address]  ([phone]   [phone]    Outpatient Rehabilitation Services  Physical Therapy Discharge Note    Date: 06/09/2022    Patient Name: Thomas [name]  Date of Birth: [birth year]  MRN: [id number]  Payor: Payor: [employer] MEDICARE / Plan: Mcarthur Rossetti CHOICE PPO / Product Type: PPO /   Referring Physician: Trenton Gammon MD   Diagnosis:   Bilateral posterior neck pain [M54.2]  - Primary      Degenerative disc disease, cervical [H96.22]         Certification of Plan From: 05/13/22  To: 08/10/22    Visit #: 8     Subjective   Patient presents to OP PT treatment with chief complaint of pinching on R side of cervical spine when looking up.    Today's pain: 2/10    Objective   Neck Disability Index (NDI) total score: 5/50     ROM      pain provoking   Cervical flexion WFL (no)   Cervical extension 20% limited  (yes)   RIGHT side bend 10% limited (no)   LEFT side bend 10% limited  (no)   RIGHT rotation 20% limited  (no)   LEFT rotation 10% limited  (no)   Comments: worse with extension, R SB, and R rot     Strength:    right left   Shrug (C3-4) 5/5 5/5   Shoulder flexion 4+/5 4+/5   Shoulder abduction (C5) 4+/5 4/5   Elbow flexion (C6) 4+/5 4+/5   Elbow extension (C7) 4+/5 4+/5   Wrist extension (C6) 4/5 5/5   Wrist flexion (C7) 5/5 5/5   Thumb extension (C8) 5/5 5/5   Finger abduction (T1) 4+/5 5/5            TREATMENT  Modalities x 15 minutes   MH to B cervical / thoracic paraspinals and UT to prep tissues and decrease hypertonicity      Manual therapy x 15 minutes   STM to cervical and thoracic paraspinals, upper trap, LS, rhomboids, middle trap, scalenes, SCM, and suboccipital musculature   Gentle manual traction, SOR   PROM into cervical flexion/ extension, B SB, and B rot     Therapeutic exercise x 25 minutes   UT stretch 20 x 5 seconds   Levator Scap stretch 10 x 5 seconds   B scapular retraction x 20  AROM cervical flex/ext, B rot x 20   Posterior shoulder rolls x 20    Rows blue t-band x 20   blue t-band lower trap pull backs x 20  Blue t-band B shoulder ER x 20  Standing shoulder flexion 2# x 20  Standing shoulder abduction 2# x 20  Cable column rows 18# x 20            Start Time: 1:40 pm  End time: 2:35 pm        Assessment   Patient has progressed well with current plan of treatment and has met all goals of PT intervention at this time.    STG:  In 2 weeks, pt will demonstrate compliance with their HEP to maximize outcomes with skilled intervention...................met.  In 2 weeks, pt will improve NDI score from 16/50 to 13/50 in order to improve QOL........................met..   In 2 weeks, pt will decrease their worst reported pain levels from 9 to 6 to promote more normalized function.................met.  In 2 weeks, pt will demonstrate improve siting posture to decrease undue  stress to cervical spine..........................met.      LTG:   In 12 weeks, pt will improve NDI score from 16/50 to 8/50 in order to return to prior level of functional capabilities...............met.   In 12 weeks, pt will decrease their worst reported pain levels from 9 to 3 improve QOL and promote more normalized function...........................met.   In 12 weeks, pt will improve cervical spine ROM to [facility] to allow patient to turn his head when driving and look down when reading without increase in symptoms............................met.  In 12 weeks improve UE strength to 4+/5 or better to allow patient to perform lifting, carrying, and general housekeeping without an increase in symptoms especially at the end of the day....................met.         Plan   Patient is DC to HEP.    The risks/benefits of therapy have been discussed with the patient and he/she is in agreement with the established plan of care.    Therapist   Loman Brooklyn, PT

## 2022-06-11 ENCOUNTER — Encounter (HOSPITAL_COMMUNITY): Payer: Self-pay

## 2022-06-15 DIAGNOSIS — L03211 Cellulitis of face: Secondary | ICD-10-CM | POA: Diagnosis not present

## 2022-06-16 ENCOUNTER — Ambulatory Visit: Payer: Medicare HMO | Admitting: Internal Medicine

## 2022-06-17 ENCOUNTER — Other Ambulatory Visit: Payer: Self-pay

## 2022-06-17 ENCOUNTER — Emergency Department (HOSPITAL_COMMUNITY): Payer: Medicare HMO

## 2022-06-17 ENCOUNTER — Emergency Department (HOSPITAL_COMMUNITY)
Admission: EM | Admit: 2022-06-17 | Discharge: 2022-06-17 | Discharge status: Against Medical Advice | Payer: Medicare HMO | Attending: Student | Admitting: Student

## 2022-06-17 DIAGNOSIS — Z20822 Contact with and (suspected) exposure to covid-19: Secondary | ICD-10-CM | POA: Diagnosis not present

## 2022-06-17 DIAGNOSIS — R059 Cough, unspecified: Secondary | ICD-10-CM | POA: Diagnosis not present

## 2022-06-17 DIAGNOSIS — I10 Essential (primary) hypertension: Secondary | ICD-10-CM | POA: Diagnosis not present

## 2022-06-17 DIAGNOSIS — Z5321 Procedure and treatment not carried out due to patient leaving prior to being seen by health care provider: Secondary | ICD-10-CM | POA: Diagnosis not present

## 2022-06-17 DIAGNOSIS — R0602 Shortness of breath: Secondary | ICD-10-CM | POA: Diagnosis not present

## 2022-06-17 DIAGNOSIS — Z743 Need for continuous supervision: Secondary | ICD-10-CM | POA: Diagnosis not present

## 2022-06-17 LAB — CBC WITH DIFFERENTIAL/PLATELET
Abs Immature Granulocytes: 0.04 10*3/uL (ref 0.00–0.07)
Basophils Absolute: 0.1 10*3/uL (ref 0.0–0.1)
Basophils Relative: 1 %
Eosinophils Absolute: 0.1 10*3/uL (ref 0.0–0.5)
Eosinophils Relative: 1 %
HCT: 51.4 % (ref 39.0–52.0)
Hemoglobin: 16.4 g/dL (ref 13.0–17.0)
Immature Granulocytes: 1 %
Lymphocytes Relative: 13 %
Lymphs Abs: 1 10*3/uL (ref 0.7–4.0)
MCH: 28.1 pg (ref 26.0–34.0)
MCHC: 31.9 g/dL (ref 30.0–36.0)
MCV: 88.2 fL (ref 80.0–100.0)
Monocytes Absolute: 0.4 10*3/uL (ref 0.1–1.0)
Monocytes Relative: 6 %
Neutro Abs: 6 10*3/uL (ref 1.7–7.7)
Neutrophils Relative %: 78 %
Platelets: 141 10*3/uL — ABNORMAL LOW (ref 150–400)
RBC: 5.83 MIL/uL — ABNORMAL HIGH (ref 4.22–5.81)
RDW: 18.3 % — ABNORMAL HIGH (ref 11.5–15.5)
WBC: 7.6 10*3/uL (ref 4.0–10.5)
nRBC: 0 % (ref 0.0–0.2)

## 2022-06-17 LAB — BASIC METABOLIC PANEL
Anion gap: 12 (ref 5–15)
BUN: 13 mg/dL (ref 8–23)
CO2: 24 mmol/L (ref 22–32)
Calcium: 9.2 mg/dL (ref 8.9–10.3)
Chloride: 103 mmol/L (ref 98–111)
Creatinine, Ser: 1.16 mg/dL (ref 0.61–1.24)
GFR, Estimated: >60 mL/min (ref >60)
Glucose, Bld: 95 mg/dL (ref 70–99)
Potassium: 3.7 mmol/L (ref 3.5–5.1)
Sodium: 139 mmol/L (ref 135–145)

## 2022-06-17 LAB — BRAIN NATRIURETIC PEPTIDE: B Natriuretic Peptide: 68.3 pg/mL (ref 0.0–100.0)

## 2022-06-17 LAB — RESP PANEL BY RT-PCR (RSV, FLU A&B, COVID)  RVPGX2
Influenza A by PCR: NEGATIVE
Influenza B by PCR: NEGATIVE
Resp Syncytial Virus by PCR: NEGATIVE
SARS Coronavirus 2 by RT PCR: NEGATIVE

## 2022-06-17 LAB — TROPONIN I (HIGH SENSITIVITY): Troponin I (High Sensitivity): 9 ng/L (ref <18)

## 2022-06-17 NOTE — ED Triage Notes (Signed)
Pt BIB EMS from home c/o shortness of breath x few. Cough and runny nose. Denies chest pain.   178/102 BP 94% O2 96 HR 117 CBG

## 2022-06-17 NOTE — ED Provider Triage Note (Cosign Needed)
Emergency Medicine Provider Triage Evaluation Note  BERNIS SCHREUR , a 77 y.o. male  was evaluated in triage.  Pt complains of shortness of breath for the last few days.  Denies any chest pain.  He has had a cough, nonproductive.  Has not noticed any lower extremity swelling, was seen in ED few days ago for jaw pain..  Review of Systems  Per HPI  Physical Exam  There were no vitals taken for this visit. Gen:   Awake, no distress   Resp:  Normal effort  MSK:   Moves extremities without difficulty  Other:    Medical Decision Making  Medically screening exam initiated at 10:21 AM.  Appropriate orders placed.  Lizabeth Leyden was informed that the remainder of the evaluation will be completed by another provider, this initial triage assessment does not replace that evaluation, and the importance of remaining in the ED until their evaluation is complete.     Sherrill Raring, PA-C 06/17/22 1021

## 2022-06-19 ENCOUNTER — Telehealth: Payer: Self-pay

## 2022-06-19 ENCOUNTER — Inpatient Hospital Stay: Payer: Medicare HMO | Attending: Hematology & Oncology

## 2022-06-19 ENCOUNTER — Inpatient Hospital Stay: Payer: Medicare HMO | Admitting: Hematology & Oncology

## 2022-06-19 NOTE — Telephone Encounter (Signed)
        Patient  visited [facility] on 06/17/2022  for shortness of breath.   Telephone encounter attempt :  1st  A HIPAA compliant voice message was left requesting a return call.  Instructed patient to call back at Unable to leave message voicemail is full.   [place] Resource Care Guide   ??[name]  ?? [phone]   Website: [email or web address]

## 2022-06-23 ENCOUNTER — Telehealth: Payer: Self-pay

## 2022-06-23 ENCOUNTER — Ambulatory Visit: Payer: Commercial Managed Care - PPO | Attending: Physician Assistant | Admitting: Physician Assistant

## 2022-06-23 ENCOUNTER — Other Ambulatory Visit: Payer: Self-pay

## 2022-06-23 ENCOUNTER — Other Ambulatory Visit (INDEPENDENT_AMBULATORY_CARE_PROVIDER_SITE_OTHER): Payer: Self-pay | Admitting: Family Medicine

## 2022-06-23 ENCOUNTER — Encounter (INDEPENDENT_AMBULATORY_CARE_PROVIDER_SITE_OTHER): Payer: Self-pay | Admitting: Physician Assistant

## 2022-06-23 VITALS — Resp 18 | Ht 71.0 in | Wt 240.0 lb

## 2022-06-23 DIAGNOSIS — N401 Enlarged prostate with lower urinary tract symptoms: Secondary | ICD-10-CM

## 2022-06-23 DIAGNOSIS — N138 Other obstructive and reflux uropathy: Secondary | ICD-10-CM

## 2022-06-23 DIAGNOSIS — Z87438 Personal history of other diseases of male genital organs: Secondary | ICD-10-CM

## 2022-06-23 DIAGNOSIS — Z125 Encounter for screening for malignant neoplasm of prostate: Secondary | ICD-10-CM | POA: Insufficient documentation

## 2022-06-23 MED ORDER — FINASTERIDE 5 MG TABLET
5.0000 mg | ORAL_TABLET | Freq: Every day | ORAL | 2 refills | Status: DC
Start: 2022-06-23 — End: 2022-09-15

## 2022-06-23 MED ORDER — ALFUZOSIN ER 10 MG TABLET,EXTENDED RELEASE 24 HR
10.0000 mg | ORAL_TABLET | Freq: Every day | ORAL | 2 refills | Status: DC
Start: 2022-06-23 — End: 2022-09-15

## 2022-06-23 NOTE — Patient Outreach (Signed)
  Care Coordination TOC Note Transition Care Management Unsuccessful Follow-up Telephone Call  Date of discharge and from where:  1/3/824[facility] [facility]  Attempts:  1st Attempt  Reason for unsuccessful TCM follow-up call:  Unable to reach patient    Enzo Montgomery, RN,BSN,CCM [facility] Management Telephonic Care Management Coordinator Direct Phone: [phone] Toll Free: [phone] Fax: [phone]

## 2022-06-23 NOTE — Telephone Encounter (Addendum)
        Patient  visited [facility] on 06/17/2022  for shortness of breath.   Telephone encounter attempt :  2nd  Unable to leave message voicemail if full.   [place] Resource Care Guide   [email or web address]  ?? [phone]   Website: [email or web address]  [email or web address]

## 2022-06-23 NOTE — Nursing Note (Signed)
06/23/22 1300   Urine test  ([employer] 10 SG)   PVR Volume 47ml

## 2022-06-23 NOTE — Progress Notes (Signed)
Thomas [name]    PATIENT NAME :Thomas [name]   DATE OF SERVICE: 06/23/2022  SERVICE LOCATION: [facility]      Thomas [name] is a 77 y.o. male who presents for followup of weakened stream and hesitancy with hematospermia.    BPH  Patient was started on Flomax at last office visit for trouble urinating. He reports that his medication helps sometimes but other times it does not. He reports that weakened stream and intermittency come and go. He denies urgency, leaking, dysuria and gross hematuria. He is bothered by retrograde ejaculation as well.    Hematospermia  Patient previously was having infrequent blood in his ejaculate. He denies recurrence. He did not complete cytology. He denies family history of GU cancer. He is an infrequent smoker quit around age 56.       Past Medical History  Past Medical History:   Diagnosis Date    Arthritis     Dysuria     Enteritis due to Rotavirus     Gout     Headache     Hemorrhoid     Hypercholesterolemia     Hypertension            Past Surgical History  Past Surgical History:   Procedure Laterality Date    HX HEMORRHOIDECTOMY             Allergies  No Active Allergies      Medications  acetaminophen (TYLENOL) 500 mg Oral Tablet, Take 1 Tablet (500 mg total) by mouth Every 4 hours as needed for Pain (pt takes tylenol pm)  apixaban (ELIQUIS) 5 mg Oral Tablet, Take 1 Tablet (5 mg total) by mouth Twice daily  diltiazem HCl (TIAZAC) 240 mg Oral Capsule,Sustained Action 24 hr, TAKE ONE CAPSULE BY MOUTH EVERY DAY  flecainide (TAMBOCOR) 100 mg Oral Tablet, TAKE ONE TABLET BY MOUTH TWICE DAILY  furosemide (LASIX) 40 mg Oral Tablet, TAKE ONE TABLET BY MOUTH EVERY DAY  losartan (COZAAR) 50 mg Oral Tablet, Take 1 Tablet (50 mg total) by mouth Once a day  nitroGLYCERIN (NITROSTAT) 0.4 mg Sublingual Tablet, Sublingual, Place 1 Tablet (0.4 mg total) under the tongue Every 5 minutes as needed for Chest pain  potassium chloride  (KLOR-CON) 10 mEq Oral Tablet Sustained Release, TAKE ONE TABLET BY MOUTH EVERY DAY  simvastatin (ZOCOR) 20 mg Oral Tablet, Take 1 Tablet (20 mg total) by mouth Every night  SPIRIVA WITH HANDIHALER 18 mcg Inhalation Capsule, w/Inhalation Device, Take 1 Capsule (18 mcg total) by inhalation Once a day  tamsulosin (FLOMAX) 0.4 mg Oral Capsule, Take 1 Capsule (0.4 mg total) by mouth Every evening after dinner    No facility-administered medications prior to visit.      OBJECTIVE:  Resp 18   Ht 1.803 m (5\' 11" )   Wt 109 kg (240 lb)   BMI 33.47 kg/m       General: No apparent distress, well appearing  HEENT:  NCAT, EOMI, OP clear  Neck:  supple, trachea midline  RESP:  Nonlabored breathing, no use of accessory muscles   CV: No extremity swelling  Neuro:  Neurological exam is consistent with patients age.     DRE: completed last office visit  Psych: alert, appropriate mood, and in no acute distress.      MSK: Ambulates without assistance      Office Testing/Procedures:  Nursing Notes:   Dionne Bucy, [place]  06/23/22 1335  Signed  06/23/22 1300   Urine test  (Siemens Multistix 10 SG)   PVR Volume 75ml            PSA:  05/2016 - 1.30  06/2017 - 2.60  04/2021 - 2.22  04/2022 - 1.66    I personally reviewed the following:    Labs - none  Imaging - none  Records - pt previous office records      ASSESSMENT:   Encounter Diagnoses   Name Primary?    Benign prostatic hyperplasia with urinary obstruction Yes    Encounter for prostate cancer screening          PLAN: 77 year old male with history of BPH w/ LUTS and hematospermia    PVR 55cc, urine testing negative as of 04/2022.  D/c Flomax due to side effects, will start alfuzosin  Discussed with patient addition of Proscar vs. Cysto for potential surgical management of BOO, pt would like to trial Proscar, se's reviewed  Sx check in 3 months  PSA stable as of 04/2022  Recommended limiting prostate irritants to help with urinary sx  Medication management: Flomax d/c,  alfuzosin, proscar  All questions and concerns were addressed during this visit.       Orders Placed This Encounter    POCT PVR    alfuzosin (UROXATRAL) 10 mg Oral Tablet Sustained Release 24 hr    finasteride (PROSCAR) 5 mg Oral Tablet     Return in about 3 months (around 09/22/2022) for In Person Visit, PVR.      Chong Sicilian, PA-C  [facility]

## 2022-06-24 ENCOUNTER — Ambulatory Visit (INDEPENDENT_AMBULATORY_CARE_PROVIDER_SITE_OTHER): Payer: Medicare HMO | Admitting: Internal Medicine

## 2022-06-24 ENCOUNTER — Encounter: Payer: Self-pay | Admitting: Internal Medicine

## 2022-06-24 VITALS — BP 114/80 | HR 61 | Ht 66.0 in | Wt 243.0 lb

## 2022-06-24 DIAGNOSIS — R6884 Jaw pain: Secondary | ICD-10-CM | POA: Insufficient documentation

## 2022-06-24 DIAGNOSIS — A0471 Enterocolitis due to Clostridium difficile, recurrent: Secondary | ICD-10-CM

## 2022-06-24 DIAGNOSIS — Z86711 Personal history of pulmonary embolism: Secondary | ICD-10-CM | POA: Diagnosis not present

## 2022-06-24 MED ORDER — PREDNISONE 20 MG PO TABS: 40.0000 mg | ORAL_TABLET | Freq: Every day | ORAL | 0 refills | Status: DC

## 2022-06-24 NOTE — Assessment & Plan Note (Signed)
Taking eliquis without missing and no evidence for recurrence in recent ER visits. SOB is improving and no pain with breathing.

## 2022-06-24 NOTE — Patient Instructions (Addendum)
We have sent in prednisone to take 2 pills daily for 5 days to help the jaw pain.

## 2022-06-24 NOTE — Progress Notes (Signed)
   Subjective:   Patient ID: Tanner [name], male    DOB: 01-Jul-1945, 77 y.o.   MRN: [id number]  HPI The patient is a 77 YO man coming in for 2 ER visits and additional urgent care visit we do not have records for. Jaw pain and left hand pain (from fall) and some SOB recently. Given course of antibiotics augmentin initially which did not help then urgent care gave clindamycin which he thinks is helping some. He has had recurrent nerve type pain in the jaw since tooth extraction some time ago which is random. It has never lasted more than 1 day which is different than this episode.  Review of Systems  Constitutional:  Positive for activity change and appetite change.  HENT:  Positive for congestion.        Jaw pain  Eyes: Negative.   Respiratory:  Positive for cough and shortness of breath. Negative for chest tightness.   Cardiovascular:  Negative for chest pain, palpitations and leg swelling.  Gastrointestinal:  Negative for abdominal distention, abdominal pain, constipation, diarrhea, nausea and vomiting.  Musculoskeletal: Negative.   Skin: Negative.   Neurological: Negative.   Psychiatric/Behavioral: Negative.      Objective:  Physical Exam Constitutional:      Appearance: He is well-developed. He is obese.  HENT:     Head: Normocephalic and atraumatic.     Comments: Jaw without obvious cellulitis or erythema or purulent drainage, edentulous Cardiovascular:     Rate and Rhythm: Normal rate and regular rhythm.  Pulmonary:     Effort: Pulmonary effort is normal. No respiratory distress.     Breath sounds: Normal breath sounds. No wheezing or rales.  Abdominal:     General: Bowel sounds are normal. There is no distension.     Palpations: Abdomen is soft.     Tenderness: There is no abdominal tenderness. There is no rebound.  Musculoskeletal:        General: Tenderness present.     Cervical back: Normal range of motion.  Skin:    General: Skin is warm and dry.  Neurological:      Mental Status: He is alert and oriented to person, place, and time.     Coordination: Coordination normal.     Vitals:   06/24/22 0928  BP: 114/80  Pulse: 61  SpO2: 91%  Weight: 243 lb (110.2 kg)  Height: '5\' 6"'$  (1.676 m)    Assessment & Plan:  Visit time 25 minutes in face to face communication with patient and coordination of care, additional 5 minutes spent in record review, coordination or care, ordering tests, communicating/referring to other healthcare professionals, documenting in medical records all on the same day of the visit for total time 30 minutes spent on the visit.

## 2022-06-24 NOTE — Assessment & Plan Note (Signed)
Not currently having diarrhea. Clindamycin is high risk for C dif so asked them to monitor closely and let us know quickly for any symptoms.

## 2022-06-24 NOTE — Assessment & Plan Note (Signed)
Suspect nerve damage flare. He is taking gabapentin 300 mg TID and will continue. Rx prednisone 5 day course to see if this helps.

## 2022-06-24 NOTE — Telephone Encounter (Signed)
Last Visit:06/02/2022     Upcoming appointments: 12/02/2022           [name], [name]  06/24/2022, 08:17

## 2022-06-25 ENCOUNTER — Telehealth: Payer: Self-pay

## 2022-06-25 NOTE — Telephone Encounter (Signed)
     Patient  visit on 06/17/2022  at [facility] was for shortness of breath.  Have you been able to follow up with your primary care physician? Yes 06/24/2022.  The patient was or was not able to obtain any needed medicine or equipment. No medications prescribed.  Are there diet recommendations that you are having difficulty following? No  Patient expresses understanding of discharge instructions and education provided has no other needs at this time.    [facility]   [email or web address]  ?? [phone]   Website: [email or web address]  [email or web address]

## 2022-06-30 DIAGNOSIS — M1711 Unilateral primary osteoarthritis, right knee: Secondary | ICD-10-CM | POA: Diagnosis not present

## 2022-07-02 ENCOUNTER — Ambulatory Visit (INDEPENDENT_AMBULATORY_CARE_PROVIDER_SITE_OTHER): Payer: Self-pay | Admitting: Family Medicine

## 2022-07-02 NOTE — Telephone Encounter (Signed)
-----  Message from [place] sent at 07/02/2022  2:03 PM EST -----  Trenton Gammon, DO    Patient callign in wanting to know if there was a medication that could help with his arthritis in his neck    Preferred [place], [facility] [id number]    [address] [place] [address]    Phone: [phone] Fax: [phone]    Hours: Not open 24 hours         Thank you,  Jinny Blossom

## 2022-07-02 NOTE — Telephone Encounter (Signed)
Please advise 

## 2022-07-02 NOTE — Telephone Encounter (Signed)
Would rec tylenol arthritis. He cannot take NSAIDs because he is on a blood thinner

## 2022-07-03 ENCOUNTER — Other Ambulatory Visit: Payer: Self-pay | Admitting: Internal Medicine

## 2022-07-03 NOTE — Telephone Encounter (Signed)
Patient also called on this RX

## 2022-07-03 NOTE — Telephone Encounter (Signed)
Patient aware and voiced understanding

## 2022-07-07 ENCOUNTER — Encounter (HOSPITAL_COMMUNITY): Payer: Self-pay | Admitting: PULMONARY DISEASE

## 2022-07-07 NOTE — Progress Notes (Signed)
Completed PA in CoverMyMeds for [employer]. Started 01/2021. Key: [id number].     Phill Mutter, PA-C

## 2022-07-16 ENCOUNTER — Other Ambulatory Visit (INDEPENDENT_AMBULATORY_CARE_PROVIDER_SITE_OTHER): Payer: Self-pay | Admitting: Physician Assistant

## 2022-07-16 ENCOUNTER — Other Ambulatory Visit: Payer: Self-pay

## 2022-07-16 ENCOUNTER — Ambulatory Visit (INDEPENDENT_AMBULATORY_CARE_PROVIDER_SITE_OTHER): Payer: Commercial Managed Care - PPO | Admitting: Family Medicine

## 2022-07-16 ENCOUNTER — Encounter (INDEPENDENT_AMBULATORY_CARE_PROVIDER_SITE_OTHER): Payer: Self-pay | Admitting: Family Medicine

## 2022-07-16 VITALS — BP 138/64 | HR 53 | Ht 71.0 in | Wt 240.1 lb

## 2022-07-16 DIAGNOSIS — L03116 Cellulitis of left lower limb: Secondary | ICD-10-CM

## 2022-07-16 MED ORDER — CEPHALEXIN 500 MG CAPSULE
500.0000 mg | ORAL_CAPSULE | Freq: Three times a day (TID) | ORAL | 0 refills | Status: AC
Start: 2022-07-16 — End: 2022-07-26

## 2022-07-16 NOTE — Progress Notes (Signed)
PRIMARY CARE, [facility]  [address]  [place] [address]  Phone: [phone]  Fax: [phone]    Encounter Date: 07/16/2022    Patient ID:  Thomas [name]  AVW:[id number]    DOB: 1945/08/27  Age: 77 y.o. male    Subjective:     Chief Complaint   Patient presents with    Swelling     The history is provided by the patient.   Leg Swelling  This is a new (left leg swelling, anterior lower leg) problem. Episode onset: a few days ago. The problem has not changed since onset.Pertinent negatives include no chest pain, no abdominal pain, no headaches and no shortness of breath. Associated symptoms comments: A little red and tender to the touch.   He is on Eliquis, complient with med  No SOB, no injury. No DVT/PE risk factors    Current Outpatient Medications   Medication Sig    acetaminophen (TYLENOL) 500 mg Oral Tablet Take 1 Tablet (500 mg total) by mouth Every 4 hours as needed for Pain (pt takes tylenol pm)    alfuzosin (UROXATRAL) 10 mg Oral Tablet Sustained Release 24 hr Take 1 Tablet (10 mg total) by mouth Once a day    apixaban (ELIQUIS) 5 mg Oral Tablet Take 1 Tablet (5 mg total) by mouth Twice daily    cephalexin (KEFLEX) 500 mg Oral Capsule Take 1 Capsule (500 mg total) by mouth Three times a day for 10 days    diltiazem HCl (TIAZAC) 240 mg Oral Capsule,Sustained Action 24 hr TAKE ONE CAPSULE BY MOUTH EVERY DAY    finasteride (PROSCAR) 5 mg Oral Tablet Take 1 Tablet (5 mg total) by mouth Once a day    flecainide (TAMBOCOR) 100 mg Oral Tablet TAKE ONE TABLET BY MOUTH TWICE DAILY    furosemide (LASIX) 40 mg Oral Tablet TAKE ONE TABLET BY MOUTH EVERY DAY    losartan (COZAAR) 50 mg Oral Tablet Take 1 Tablet (50 mg total) by mouth Once a day    nitroGLYCERIN (NITROSTAT) 0.4 mg Sublingual Tablet, Sublingual Place 1 Tablet (0.4 mg total) under the tongue Every 5 minutes as needed for Chest pain    potassium chloride (KLOR-CON) 10 mEq Oral Tablet Sustained Release TAKE ONE TABLET BY  MOUTH EVERY DAY    simvastatin (ZOCOR) 20 mg Oral Tablet Take 1 Tablet (20 mg total) by mouth Every night    SPIRIVA WITH HANDIHALER 18 mcg Inhalation Capsule, w/Inhalation Device Take 1 Capsule (18 mcg total) by inhalation Once a day    tamsulosin (FLOMAX) 0.4 mg Oral Capsule Take 1 Capsule (0.4 mg total) by mouth Every evening after dinner     No Known Allergies  Past Medical History:   Diagnosis Date    Arthritis     Dysuria     Enteritis due to Rotavirus     Gout     Headache     Hemorrhoid     Hypercholesterolemia     Hypertension          Past Surgical History:   Procedure Laterality Date    HX HEMORRHOIDECTOMY           Family Medical History:       Problem Relation (Age of Onset)    Cancer Mother    Hypertension (High Blood Pressure) Mother, Father            Social History     Tobacco Use    Smoking status: Former  Types: Cigars    Smokeless tobacco: Former     Types: Chew, Snuff   Vaping Use    Vaping Use: Never used   Substance Use Topics    Alcohol use: Yes     Comment: occasional    Drug use: Never       Review of Systems   Constitutional:  Negative for chills and fever.   Respiratory:  Negative for cough, shortness of breath and wheezing.    Cardiovascular:  Positive for leg swelling. Negative for chest pain and palpitations.   Gastrointestinal:  Negative for abdominal pain, nausea and vomiting.   Skin:  Positive for color change.   Neurological:  Negative for dizziness and headaches.     Objective:   Vitals: BP 138/64   Pulse 53   Ht 1.803 m (5\' 11" )   Wt 109 kg (240 lb 2 oz)   SpO2 95%   BMI 33.49 kg/m         Physical Exam  Constitutional:       General: He is not in acute distress.     Appearance: Normal appearance. He is not ill-appearing.   Cardiovascular:      Rate and Rhythm: Normal rate and regular rhythm.      Heart sounds: No murmur heard.  Pulmonary:      Effort: Pulmonary effort is normal. No respiratory distress.      Breath sounds: Normal breath sounds. No wheezing.    Musculoskeletal:         General: Swelling present. No tenderness.      Cervical back: No rigidity.      Comments: He does have  erythema, increased warmth, mild tenderness and swelling of the anterior aspect of his left lower leg.  No calf pain or tenderness and negative Homans sign.   Lymphadenopathy:      Cervical: No cervical adenopathy.   Skin:     General: Skin is warm and dry.   Neurological:      General: No focal deficit present.      Mental Status: He is alert and oriented to person, place, and time.         Assessment & Plan:     ENCOUNTER DIAGNOSES     ICD-10-CM   1. Cellulitis of left anterior lower leg  L03.116       Orders Placed This Encounter    cephalexin (KEFLEX) 500 mg Oral Capsule       Return if symptoms worsen or fail to improve.    Trenton Gammon, DO

## 2022-07-16 NOTE — Nursing Note (Signed)
Pt here for swelling of left leg x 1 week, is also slightly red in color and warm to the touch

## 2022-07-17 ENCOUNTER — Ambulatory Visit (INDEPENDENT_AMBULATORY_CARE_PROVIDER_SITE_OTHER): Payer: Medicare HMO | Admitting: Internal Medicine

## 2022-07-17 ENCOUNTER — Encounter: Payer: Self-pay | Admitting: Internal Medicine

## 2022-07-17 VITALS — BP 160/80 | HR 80 | Temp 98.5°F | Ht 66.0 in | Wt 239.0 lb

## 2022-07-17 DIAGNOSIS — R6884 Jaw pain: Secondary | ICD-10-CM

## 2022-07-17 MED ORDER — LIDOCAINE VISCOUS HCL 2 % MT SOLN: 15.0000 mL | OROMUCOSAL | 0 refills | Status: DC | PRN

## 2022-07-17 MED ORDER — PREDNISONE 20 MG PO TABS: ORAL_TABLET | ORAL | 0 refills | Status: DC

## 2022-07-17 NOTE — Patient Instructions (Addendum)
We have sent in prednisone to take 2 pills daily for 1 week. Then take 1 pill daily for the next week. Then take 1/2 pill daily for 4 days.  We have sent in lidocaine to swish around the mouth.

## 2022-07-17 NOTE — Progress Notes (Signed)
   Subjective:   Patient ID: Tanner [name], male    DOB: [birth year], 77 y.o.   MRN: [id number]  HPI Having jaw pain ongoing prednisone did not help.  Review of Systems  Constitutional: Negative.   HENT: Negative.         Jaw pain  Eyes: Negative.   Respiratory:  Negative for cough, chest tightness and shortness of breath.   Cardiovascular:  Negative for chest pain, palpitations and leg swelling.  Gastrointestinal:  Negative for abdominal distention, abdominal pain, constipation, diarrhea, nausea and vomiting.  Musculoskeletal: Negative.   Skin: Negative.   Neurological: Negative.   Psychiatric/Behavioral: Negative.      Objective:  Physical Exam Constitutional:      Appearance: He is well-developed. He is obese.  HENT:     Head: Normocephalic and atraumatic.     Comments: Jaw pain Cardiovascular:     Rate and Rhythm: Normal rate and regular rhythm.  Pulmonary:     Effort: Pulmonary effort is normal. No respiratory distress.     Breath sounds: Normal breath sounds. No wheezing or rales.  Abdominal:     General: Bowel sounds are normal. There is no distension.     Palpations: Abdomen is soft.     Tenderness: There is no abdominal tenderness. There is no rebound.  Musculoskeletal:     Cervical back: Normal range of motion.  Skin:    General: Skin is warm and dry.  Neurological:     Mental Status: He is alert and oriented to person, place, and time.     Coordination: Coordination normal.     Vitals:   07/17/22 1506 07/17/22 1512  BP: (!) 160/80 (!) 160/80  Pulse: 80   Temp: 98.5 F (36.9 C)   TempSrc: Oral   SpO2: 95%   Weight: 239 lb (108.4 kg)   Height: '5\' 6"'$  (1.676 m)     Assessment & Plan:

## 2022-07-17 NOTE — Assessment & Plan Note (Addendum)
Rx prednisone longer course 40 mg daily for 1 week, then 20 mg daily for 1 week then 10 mg daily for 4 days. Rx lidocaine to help with the pain.

## 2022-07-20 ENCOUNTER — Other Ambulatory Visit (INDEPENDENT_AMBULATORY_CARE_PROVIDER_SITE_OTHER): Payer: Self-pay | Admitting: Family Medicine

## 2022-07-20 ENCOUNTER — Other Ambulatory Visit (INDEPENDENT_AMBULATORY_CARE_PROVIDER_SITE_OTHER): Payer: Self-pay | Admitting: Medical

## 2022-07-20 DIAGNOSIS — R69 Illness, unspecified: Secondary | ICD-10-CM | POA: Diagnosis not present

## 2022-07-20 DIAGNOSIS — N529 Male erectile dysfunction, unspecified: Secondary | ICD-10-CM | POA: Diagnosis not present

## 2022-07-20 DIAGNOSIS — G4730 Sleep apnea, unspecified: Secondary | ICD-10-CM | POA: Diagnosis not present

## 2022-07-20 DIAGNOSIS — Z008 Encounter for other general examination: Secondary | ICD-10-CM | POA: Diagnosis not present

## 2022-07-20 DIAGNOSIS — E785 Hyperlipidemia, unspecified: Secondary | ICD-10-CM | POA: Diagnosis not present

## 2022-07-20 DIAGNOSIS — E1142 Type 2 diabetes mellitus with diabetic polyneuropathy: Secondary | ICD-10-CM | POA: Diagnosis not present

## 2022-07-20 DIAGNOSIS — M1990 Unspecified osteoarthritis, unspecified site: Secondary | ICD-10-CM | POA: Diagnosis not present

## 2022-07-20 DIAGNOSIS — N400 Enlarged prostate without lower urinary tract symptoms: Secondary | ICD-10-CM | POA: Diagnosis not present

## 2022-07-20 DIAGNOSIS — N189 Chronic kidney disease, unspecified: Secondary | ICD-10-CM | POA: Diagnosis not present

## 2022-07-20 DIAGNOSIS — R269 Unspecified abnormalities of gait and mobility: Secondary | ICD-10-CM | POA: Diagnosis not present

## 2022-07-20 DIAGNOSIS — M4800 Spinal stenosis, site unspecified: Secondary | ICD-10-CM | POA: Diagnosis not present

## 2022-07-20 NOTE — Telephone Encounter (Signed)
Last Visit:07/16/2022     Upcoming appointments: 12/02/2022           [name], [place]  07/20/2022, 16:03

## 2022-07-20 NOTE — Telephone Encounter (Unsigned)
[place], [facility]  [address]  [place] [address]  [phone]  Breaker E Hutto  Date of Service:  07/20/2022  MRN:  [id number]    Medication reconcillation complete and refill parameters met. Request routed to provider and will then be sent electronically to pharmacy.     Veneta Penton, RN  07/20/2022, 16:01

## 2022-07-22 ENCOUNTER — Other Ambulatory Visit: Payer: Self-pay

## 2022-07-22 ENCOUNTER — Ambulatory Visit: Payer: Commercial Managed Care - PPO | Attending: PHYSICIAN ASSISTANT | Admitting: PHYSICIAN ASSISTANT

## 2022-07-22 ENCOUNTER — Encounter (INDEPENDENT_AMBULATORY_CARE_PROVIDER_SITE_OTHER): Payer: Self-pay | Admitting: PHYSICIAN ASSISTANT

## 2022-07-22 VITALS — BP 140/80 | HR 76 | Ht 71.0 in | Wt 239.0 lb

## 2022-07-22 DIAGNOSIS — Z7901 Long term (current) use of anticoagulants: Secondary | ICD-10-CM

## 2022-07-22 DIAGNOSIS — E782 Mixed hyperlipidemia: Secondary | ICD-10-CM | POA: Insufficient documentation

## 2022-07-22 DIAGNOSIS — Z79899 Other long term (current) drug therapy: Secondary | ICD-10-CM

## 2022-07-22 DIAGNOSIS — I10 Essential (primary) hypertension: Secondary | ICD-10-CM | POA: Insufficient documentation

## 2022-07-22 DIAGNOSIS — I4892 Unspecified atrial flutter: Secondary | ICD-10-CM | POA: Insufficient documentation

## 2022-07-22 LAB — ECG 12 LEAD
Atrial Rate: 52 {beats}/min
Calculated P Axis: 52 degrees
Calculated R Axis: -72 degrees
Calculated T Axis: 34 degrees
PR Interval: 234 ms
QRS Duration: 100 ms
QT Interval: 470 ms
QTC Calculation: 437 ms
Ventricular rate: 52 {beats}/min

## 2022-07-22 NOTE — Progress Notes (Signed)
Thomas [name] is a 77 y.o. male seen in the office on 07/22/2022    Chief Complaint   Patient presents with    Atrial Flutter         Patient Active Problem List   Diagnosis    Atrial flutter (CMS HCC)    Body mass index (BMI) of 33.0 to 33.9 in adult    Essential hypertension    Hyperlipidemia    Left bundle branch hemiblock    Paroxysmal atrial flutter (CMS HCC)    History of cardioversion    History of cardiac cath-Overall normal coronary anatomy 12/26/2018    Cardiac left ventricular ejection fraction 55% on TTE 12/24/21       History of Present Illness: This is 77 year old male patient with a past medical history of paroxysmal atrial flutter/fib, hypertension, hyperlipidemia, gout, and chronic shortness of breath-patient is to see pulmonology in this regard.  He was seen one time by Dr. Janyth Contes on 10/20/2017 secondary to atrial flutter.  Holter monitor, echocardiogram, and stress test were completed in the past.  Patient was seen at our office to establish care.  He was complaining of chest pain that radiated into his left arm and palpitations.  Nuclear stress test and Holter monitor was ordered.  Patient underwent nuclear stress test which returned abnormal.  Holter monitor was never completed.  Cardiac catheterization was completed 12/26/2018 which revealed overall normal coronary anatomy with an estimated ejection fraction of 55%.  Dr. Ginger Organ recommended TEE/DC cardioversion as he was in atrial fibrillation/atrial flutter.  TEE was completed revealing an EF of 55-60%.  No evidence of thrombus was identified.  The patient was successfully cardioverted to normal sinus rhythm and initiated on flecainide.  When the patient presented for follow-up following TEE/DC cardioversion he was back in atrial fibrillation.  The patient wished to continue with rate control and anticoagulation.  His flecainide was discontinued.  Since then, he did convert back to normal sinus and his flecainide was re-initiated.  He has  continued to go in and out of atrial fibrillation/flutter since then.     A 7 day event monitor ordered incomplete 06/11/2021 due to symptoms of "heart flutters" revealed rare ventricular ectopic beats with no significant complex ectopy.  No cardiac arrhythmias we were identified.  Minimal heart rate was 40 beats per minute with maximum heart rate of 82 beats per minute and average heart rate 53.     He presents to the clinic today for routine cardiology office follow-up visit.  He is recently been diagnosed with cellulitis of his left leg and is currently taking Keflex.  Reports the leg is still red and warm and is not really getting any better and he feels it is worse.  From a cardiac perspective he is feeling well.  He denies chest pain, shortness of breath, lightheadedness, dizziness, syncope or near-syncope.  He declines need for medication refill today he voices no acute cardiac concerns.      Current Outpatient Medications   Medication Sig    acetaminophen (TYLENOL) 500 mg Oral Tablet Take 1 Tablet (500 mg total) by mouth Every 4 hours as needed for Pain (pt takes tylenol pm)    alfuzosin (UROXATRAL) 10 mg Oral Tablet Sustained Release 24 hr Take 1 Tablet (10 mg total) by mouth Once a day    apixaban (ELIQUIS) 5 mg Oral Tablet Take 1 Tablet (5 mg total) by mouth Twice daily    cephalexin (KEFLEX) 500 mg Oral Capsule Take 1 Capsule (  500 mg total) by mouth Three times a day for 10 days    diltiazem HCl (TIAZAC) 240 mg Oral Capsule,Sustained Action 24 hr TAKE ONE CAPSULE BY MOUTH EVERY DAY    finasteride (PROSCAR) 5 mg Oral Tablet Take 1 Tablet (5 mg total) by mouth Once a day    flecainide (TAMBOCOR) 100 mg Oral Tablet TAKE ONE TABLET BY MOUTH TWICE DAILY    furosemide (LASIX) 40 mg Oral Tablet TAKE ONE TABLET BY MOUTH EVERY DAY    losartan (COZAAR) 50 mg Oral Tablet Take 1 Tablet (50 mg total) by mouth Once a day    nitroGLYCERIN (NITROSTAT) 0.4 mg Sublingual Tablet, Sublingual Place 1 Tablet (0.4 mg total)  under the tongue Every 5 minutes as needed for Chest pain    potassium chloride (KLOR-CON) 10 mEq Oral Tablet Sustained Release TAKE ONE TABLET BY MOUTH EVERY DAY    simvastatin (ZOCOR) 20 mg Oral Tablet TAKE ONE TABLET BY MOUTH every other night    SPIRIVA WITH HANDIHALER 18 mcg Inhalation Capsule, w/Inhalation Device Take 1 Capsule (18 mcg total) by inhalation Once a day    tamsulosin (FLOMAX) 0.4 mg Oral Capsule Take 1 Capsule (0.4 mg total) by mouth Every evening after dinner       No Known Allergies  Family Medical History:       Problem Relation (Age of Onset)    Cancer Mother    Hypertension (High Blood Pressure) Mother, Father            Social History     Socioeconomic History    Marital status: Widowed   Tobacco Use    Smoking status: Former     Types: Cigars    Smokeless tobacco: Former     Types: Chew, Snuff   Vaping Use    Vaping Use: Never used   Substance and Sexual Activity    Alcohol use: Yes     Comment: occasional    Drug use: Never    Sexual activity: Not Currently     Social Determinants of Health     Financial Resource Strain: Low Risk  (07/22/2022)    Financial Resource Strain     SDOH Financial: No   Transportation Needs: Low Risk  (07/22/2022)    Transportation Needs     SDOH Transportation: No   Social Connections: Low Risk  (07/22/2022)    Social Connections     SDOH Social Isolation: 5 or more times a week   Intimate Partner Violence: Low Risk  (07/22/2022)    Intimate Partner Violence     SDOH Domestic Violence: I have not had a partner in the past year.   Housing Stability: Low Risk  (07/22/2022)    Housing Stability     SDOH Housing Situation: I have housing.     SDOH Housing Worry: No         Physical Exam:  BP (!) 140/80   Pulse 76   Ht 1.803 m (5' 11"$ )   Wt 108 kg (239 lb)   SpO2 93%   BMI 33.33 kg/m       Body mass index is 33.33 kg/m.  Wt Readings from Last 5 Encounters:   07/22/22 108 kg (239 lb)   07/16/22 109 kg (240 lb 2 oz)   06/23/22 109 kg (240 lb)   06/02/22 109 kg (240 lb 6  oz)   04/20/22 109 kg (240 lb)       General:  Well-appearing elderly adult male sitting  on exam table, no apparent distress Awake alert oriented  Neck: Strong Carotid pulse, no JVD, No carotid bruit,   Heart:  Regular rate and rhythm without murmurs,rubs, or gallops  Lungs:  Clear to auscultation without rales wheezes or rhonchi  Abdomen:  Soft, nontender nondistended, normoactive bowel sounds  Extremities:  No peripheral edema, bilateral lower extremities warm to touch, palpable bilateral posterior tibialis pulses  Skin:  Warm, dry    PERTINENT DIAGNOSTICS: EKG performed in the office today shows sinus bradycardia at 52 beats per minute no acute ischemic change    Pertinent labs reviewed from 05/01/2022 show sodium 141, potassium 3.6, chloride 102, BUN 13, creatinine 1.02    Lipid panel performed on 05/01/2022  CHOLESTEROL  100 - 200 mg/dL 137 149 126 R 164 R 153 R 151 R 198 R   HDL CHOL  >=50 mg/dL 53 50 49 R 53 R 54 R 52 R 58 R   TRIGLYCERIDES  <150 mg/dL 70 58 77 R 120 R 64 R 58 R 82 R   LDL CALC  <100 mg/dL 70 87 CM 62 R 87 R 86 R 87 R 82 R   VLDL CALC  <30 mg/dL 10 9 15 $ R 24 R      NON-HDL  <=190 mg/dL 84 99        CHOL/HDL RATIO 2.6 3.0 2.6 3.1          IMPRESSION:  Assessment/Plan   1. Atrial flutter (CMS HCC)    2. Essential hypertension    3. Mixed hyperlipidemia          PLAN:    1. Atrial flutter paroxysmal: Currently maintaining sinus rhythm, continue Cardizem, flecainide, Eliquis      2. Essential hypertension:  The patient has brought a log of his blood pressure readings and for the most part his blood pressure ranges in the 115-120 range.  He does have several outlier readings that are in the 130-140 range however it appears that over 50% of the time he is normotensive.  Continue losartan    3. Mixed hyperlipidemia:  Well controlled as documented above, continue simvastatin    Chart sent to Dr. Ginger Organ for review.  He was readily available at time of office visit for discussion regarding this  patient  Orders Placed This Encounter    ECG, In Clinc, Same Day ([facility])

## 2022-07-23 ENCOUNTER — Emergency Department (HOSPITAL_COMMUNITY): Payer: Commercial Managed Care - PPO | Admitting: Ultrasound

## 2022-07-23 ENCOUNTER — Emergency Department
Admission: EM | Admit: 2022-07-23 | Discharge: 2022-07-23 | Disposition: A | Discharge status: Home / Self Care | Payer: Commercial Managed Care - PPO | Attending: Emergency Medicine | Admitting: Emergency Medicine

## 2022-07-23 ENCOUNTER — Ambulatory Visit (INDEPENDENT_AMBULATORY_CARE_PROVIDER_SITE_OTHER): Payer: Self-pay | Admitting: Family Medicine

## 2022-07-23 ENCOUNTER — Other Ambulatory Visit: Payer: Self-pay

## 2022-07-23 ENCOUNTER — Emergency Department (HOSPITAL_COMMUNITY): Payer: Commercial Managed Care - PPO

## 2022-07-23 ENCOUNTER — Encounter (HOSPITAL_COMMUNITY): Payer: Self-pay

## 2022-07-23 DIAGNOSIS — M7989 Other specified soft tissue disorders: Secondary | ICD-10-CM | POA: Insufficient documentation

## 2022-07-23 DIAGNOSIS — L03116 Cellulitis of left lower limb: Secondary | ICD-10-CM | POA: Insufficient documentation

## 2022-07-23 LAB — COMPREHENSIVE METABOLIC PANEL, NON-FASTING
ALBUMIN: 3.7 g/dL (ref 3.4–4.8)
ALKALINE PHOSPHATASE: 121 U/L — ABNORMAL HIGH (ref 45–115)
ALT (SGPT): 12 U/L (ref 10–55)
ANION GAP: 6 mmol/L (ref 4–13)
AST (SGOT): 14 U/L (ref 8–45)
BILIRUBIN TOTAL: 0.6 mg/dL (ref 0.3–1.3)
BUN/CREA RATIO: 16 (ref 6–22)
BUN: 14 mg/dL (ref 8–25)
CALCIUM: 8.9 mg/dL (ref 8.6–10.3)
CHLORIDE: 106 mmol/L (ref 96–111)
CO2 TOTAL: 31 mmol/L (ref 23–31)
CREATININE: 0.86 mg/dL (ref 0.75–1.35)
ESTIMATED GFR - MALE: >90 mL/min/BSA (ref >=60)
GLUCOSE: 74 mg/dL (ref 65–125)
POTASSIUM: 4.4 mmol/L (ref 3.5–5.1)
PROTEIN TOTAL: 6.1 g/dL (ref 6.0–8.0)
SODIUM: 143 mmol/L (ref 136–145)

## 2022-07-23 LAB — CBC
HCT: 42.8 % (ref 38.9–52.0)
HGB: 14.2 g/dL (ref 13.4–17.5)
MCH: 31.4 pg (ref 26.0–32.0)
MCHC: 33.2 g/dL (ref 31.0–35.5)
MCV: 94.7 fL (ref 78.0–100.0)
MPV: 10.2 fL (ref 8.7–12.5)
PLATELETS: 191 10*3/uL (ref 150–400)
RBC: 4.52 10*6/uL (ref 4.50–6.10)
RDW-CV: 13.1 % (ref 11.5–15.5)
WBC: 7.5 10*3/uL (ref 3.7–11.0)

## 2022-07-23 LAB — BLUE TOP TUBE

## 2022-07-23 LAB — LACTIC ACID LEVEL W/ REFLEX FOR LEVEL >2.0: LACTIC ACID: 0.8 mmol/L (ref 0.5–2.2)

## 2022-07-23 MED ORDER — DOXYCYCLINE HYCLATE 100 MG CAPSULE
100.0000 mg | ORAL_CAPSULE | ORAL | Status: AC
Start: 2022-07-23 — End: 2022-07-23
  Administered 2022-07-23: 100 mg via ORAL
  Filled 2022-07-23: qty 1

## 2022-07-23 MED ORDER — DOXYCYCLINE HYCLATE 100 MG CAPSULE
100.0000 mg | ORAL_CAPSULE | Freq: Two times a day (BID) | ORAL | 0 refills | Status: AC
Start: 2022-07-23 — End: 2022-08-02

## 2022-07-23 NOTE — Telephone Encounter (Signed)
Regarding: Dr Thomas [name], acute appt  ----- Message from [name] sent at 07/23/2022  4:36 PM EST -----  Thomas Gammon, DO    The patient called - he scheduled the soonest appointment in the office on 07/29/22.  He is complaining of left leg cellulitis with two days left on the antibiotics.  He said it looks worse, the red spot is larger.    Thank you,  [name]

## 2022-07-23 NOTE — Telephone Encounter (Signed)
Per pt he actually feels his cellulitis is getting worse, he has 2 days left of Keflex and feels worse.   He states the area is swelled up and tender.  He saw cardio yesterday who told him contact PCP because he should be feeling better by now.  The call center scheduled him 2/14 for an appt regarding this, can you please advise?    Denies fever, body aches

## 2022-07-23 NOTE — ED Nurses Note (Signed)
IV placed, blood work obtained and sent to lab. The risks and benefits have been discussed with patient in regards to placing a peripheral IV and drawing blood in the triage area. The patient was advised that they may have to wait in ED waiting room after the PIV. The patient was advised not to tamper with the PIV or infuse anything through the PIV. The patient was advised if there is any concern or problem with PIV to contact nurse or staff. The patient was advised if they choose to leave before being seen by a provider or without treatment; the patient needs to notify the nurse or staff to have PIV removed. The patient is aware not to leave the ED waiting area while the PIV is in place. The patient verbalized understanding and agrees with the plan of action.

## 2022-07-23 NOTE — Telephone Encounter (Signed)
No.  He should not wait until next week for this.  He has already been on antibiotics for greater than 48 hours.  He should be seen either in the emergency department tonight if this is actually worsening or in the office tomorrow.

## 2022-07-23 NOTE — Telephone Encounter (Signed)
Pt is refusing to go to the ER tonight and wants to be seen in office tomorrow. Instead. I advised ER 3 times and he declines.  Appt scheduled for in the morning.

## 2022-07-23 NOTE — ED Triage Notes (Signed)
Cellulitis lle ... Given abx oral 8 days ago w/o relief.. sent by dr Amada Jupiter

## 2022-07-23 NOTE — ED Provider Notes (Signed)
[facility]  ED Primary Provider Note  History of Present Illness   Chief Complaint   Patient presents with    Cellulitis     Thomas [name] is a 77 y.o. male who had concerns including Cellulitis.  Arrival: The patient arrived by Car  {Dissapearing Help Tip  Use the HPI, Phys Exam, & MDM tabs at the top of the note composer to access the Notewriter SmartBlocks for the respective sections as needed. As of Jun 15, 2021 you are only required to have a "medically appropriate" History, ROS, and PE for billing purposes:38154}  HPI  History Reviewed This Encounter: Medical History  Surgical History  Family History  Social History    Physical Exam   ED Triage Vitals   BP (Non-Invasive) 07/23/22 1756 (!) 150/93   Heart Rate 07/23/22 1756 62   Respiratory Rate 07/23/22 1756 20   Temperature 07/23/22 1756 36.7 C (98.1 F)   SpO2 07/23/22 1756 97 %   Weight 07/23/22 1755 109 kg (241 lb)   Height 07/23/22 1755 1.803 m (5' 11"$ )     Physical Exam  Patient Data   {Dissapearing Help Tip  Click here to open the ED Workup Activity for clinical data review:123}Labs Ordered/Reviewed - No data to display  No orders to display     Medical Decision Making   ED clinical impression missing, please click on the following link to add Clinical Impressions ***  then refresh the note prior to signing.  {Dissapearing Help Tip  Be sure to fill out the MDM SmartBlock in Notewriter to the JP:[id number]  Medical Decision Making              ED clinical impression missing, please click on the following link to add Clinical Impressions ***  then refresh the note prior to signing.    Disposition: Data Unavailable  {Dissapearing Help Tip  Click Here to Open Level of Service Calculator:123}  {Critical Care Time (Optional):37527}     {Dissapearing Help Tip  Remember to refresh your note prior to signing. Use Control + [id number] or click the refresh button at the bottom of the note:38154}

## 2022-07-24 ENCOUNTER — Ambulatory Visit (INDEPENDENT_AMBULATORY_CARE_PROVIDER_SITE_OTHER): Payer: Self-pay | Admitting: Nurse Practitioner

## 2022-07-24 ENCOUNTER — Other Ambulatory Visit (INDEPENDENT_AMBULATORY_CARE_PROVIDER_SITE_OTHER): Payer: Self-pay | Admitting: Family Medicine

## 2022-07-24 LAB — RED TOP TUBE

## 2022-07-24 NOTE — Telephone Encounter (Signed)
PT has forms from [facility] that needs faxed with script, script must include DEA and shipping preference (i will add ship to patient), I did scan in forms for reference

## 2022-07-27 MED ORDER — APIXABAN 5 MG TABLET
5.0000 mg | ORAL_TABLET | Freq: Two times a day (BID) | ORAL | 3 refills | Status: DC
Start: 2022-07-27 — End: 2022-08-17

## 2022-07-28 LAB — ADULT ROUTINE BLOOD CULTURE, SET OF 2 BOTTLES (BACTERIA AND YEAST): BLOOD CULTURE, ROUTINE: NO GROWTH

## 2022-07-29 ENCOUNTER — Ambulatory Visit (INDEPENDENT_AMBULATORY_CARE_PROVIDER_SITE_OTHER): Payer: Self-pay | Admitting: PHYSICIAN ASSISTANT

## 2022-08-07 ENCOUNTER — Ambulatory Visit (INDEPENDENT_AMBULATORY_CARE_PROVIDER_SITE_OTHER): Payer: Self-pay | Admitting: Family Medicine

## 2022-08-07 NOTE — Telephone Encounter (Signed)
Regarding: shipping delivery preference  ----- Message from [place] sent at 08/07/2022 12:22 PM EST -----  Trenton Gammon, DO  Thomas [name] is needing shipping delivery preference   please fax to [phone]  for the medication below      Disp Refills Start End   apixaban (ELIQUIS) 5 mg Oral Tablet 180 Tablet 3 07/27/2022 -   Sig - Route: Take 1 Tablet (5 mg total) by mouth Twice daily SHIP TO PATIENT,  [id number]  STATE LICENSE- 0000000 L

## 2022-08-07 NOTE — Telephone Encounter (Signed)
Unsure of what they are wanting, script that was faxed stated to ship to patient, I did write this on cover sheet and faxed to number given, if this is not correct need to know what the options are

## 2022-08-12 ENCOUNTER — Encounter (INDEPENDENT_AMBULATORY_CARE_PROVIDER_SITE_OTHER): Payer: Self-pay | Admitting: Family Medicine

## 2022-08-12 ENCOUNTER — Ambulatory Visit (INDEPENDENT_AMBULATORY_CARE_PROVIDER_SITE_OTHER): Payer: Commercial Managed Care - PPO | Admitting: Family Medicine

## 2022-08-12 ENCOUNTER — Other Ambulatory Visit: Payer: Self-pay

## 2022-08-12 VITALS — BP 128/80 | HR 55 | Ht 71.0 in | Wt 233.0 lb

## 2022-08-12 DIAGNOSIS — R600 Localized edema: Secondary | ICD-10-CM

## 2022-08-12 DIAGNOSIS — L03116 Cellulitis of left lower limb: Secondary | ICD-10-CM

## 2022-08-12 DIAGNOSIS — M5030 Other cervical disc degeneration, unspecified cervical region: Secondary | ICD-10-CM

## 2022-08-12 DIAGNOSIS — E782 Mixed hyperlipidemia: Secondary | ICD-10-CM

## 2022-08-12 DIAGNOSIS — M542 Cervicalgia: Secondary | ICD-10-CM

## 2022-08-12 MED ORDER — ROSUVASTATIN 10 MG TABLET
10.0000 mg | ORAL_TABLET | Freq: Every evening | ORAL | 3 refills | Status: DC
Start: 2022-08-12 — End: 2023-07-06

## 2022-08-12 NOTE — Nursing Note (Signed)
Patient here for ed follow up uh

## 2022-08-12 NOTE — Progress Notes (Signed)
[facility]  PRIMARY CARE, [facility]  [place] [place]  [place] [address]  [phone]     Chief complaint:  Emergency room follow-up    History of present illness : This is a 77 y.o. male presenting today for an Emergency room follow-up.  Emergency room records were received and reviewed prior to this dictation.    He initially presented here on 07/16/2022 with complaining of left leg swelling and was diagnosed with cellulitis and put on Keflex.   He was not getting much improvement with the Keflex so he did present to [facility] on 07/23/2022.  Ultrasound was negative for DVT.  White blood cell count was normal and lactic acid level was normal.  Blood cultures were normal.  He was also given a prescription for doxycycline and his 1st dose was given in the emergency department.  He was to continue the full course of doxycycline and also finish his Keflex.  He is here today for follow-up.  He does not have as much redness.  He has no fevers or chills.  His leg is still swollen but it has improved somewhat.  Both of his legs swell but the left has always been more.  He does wear over-the-counter compression socks.  He is on diuretics.    He is still complaining of pain in his neck.  He did have x-rays done in November showing significant degenerative disc disease and was referred to physical therapy.  He did not get any relief from physical therapy and he did go to a chiropractor and got some relief.  He states that he is interested in going to the next step and is wondering if he should have an injection like his brother did.  He states the pain of his neck more to the right side but does not radiate down his arms.  No numbness or tingling in his hands.  No weakness of his hands.  No injury.  He has not had an MRI nor has he seen neurosurgery.    REVIEW OF SYSTEMS:  GENERAL: negative for fatigue, significant weight change, sleep disturbance  HEENT:denies  visual changes, denies sore throat or upper respiratory symptoms  RESPIRATORY: No shortness of breath, cough,or wheeze  CARDIAC: No chest pains, shortness of breath on exertion or palpitations  GI: no nausea, vomiting or diarrhea, no abdominal pain  MUSCULOSKELETAL: no new myalgias or arthragias, neck pain as above  NEUROLOGIC: no headache, visual changes or mental status changes  GU: no urinary symptoms or flank tenderness     Current Outpatient Medications   Medication Sig    acetaminophen (TYLENOL) 500 mg Oral Tablet Take 1 Tablet (500 mg total) by mouth Every 4 hours as needed for Pain (pt takes tylenol pm)    alfuzosin (UROXATRAL) 10 mg Oral Tablet Sustained Release 24 hr Take 1 Tablet (10 mg total) by mouth Once a day    apixaban (ELIQUIS) 5 mg Oral Tablet Take 1 Tablet (5 mg total) by mouth Twice daily SHIP TO PATIENT,   [id number]  STATE LICENSE- 0000000 L  NPI- [id number]    diltiazem HCl (TIAZAC) 240 mg Oral Capsule,Sustained Action 24 hr TAKE ONE CAPSULE BY MOUTH EVERY DAY    finasteride (PROSCAR) 5 mg Oral Tablet Take 1 Tablet (5 mg total) by mouth Once a day    flecainide (TAMBOCOR) 100 mg Oral Tablet TAKE ONE TABLET BY MOUTH TWICE DAILY    furosemide (LASIX) 40 mg Oral Tablet TAKE ONE TABLET BY  MOUTH EVERY DAY    losartan (COZAAR) 50 mg Oral Tablet Take 1 Tablet (50 mg total) by mouth Once a day    nitroGLYCERIN (NITROSTAT) 0.4 mg Sublingual Tablet, Sublingual Place 1 Tablet (0.4 mg total) under the tongue Every 5 minutes as needed for Chest pain    potassium chloride (KLOR-CON) 10 mEq Oral Tablet Sustained Release TAKE ONE TABLET BY MOUTH EVERY DAY    rosuvastatin (CRESTOR) 10 mg Oral Tablet Take 1 Tablet (10 mg total) by mouth Every evening    SPIRIVA WITH HANDIHALER 18 mcg Inhalation Capsule, w/Inhalation Device Take 1 Capsule (18 mcg total) by inhalation Once a day    tamsulosin (FLOMAX) 0.4 mg Oral Capsule Take 1 Capsule (0.4 mg total) by mouth Every evening after dinner     No Known  Allergies  Past Medical History:   Diagnosis Date    Arthritis     Dysuria     Enteritis due to Rotavirus     Gout     Headache     Hemorrhoid     Hypercholesterolemia     Hypertension          Social History     Tobacco Use    Smoking status: Former     Types: Cigars    Smokeless tobacco: Former     Types: Chew, Snuff   Substance Use Topics    Alcohol use: Yes     Comment: occasional       PHYSICAL EXAM:   The patient appears to be in no acute distress.  BP 128/80   Pulse 55   Ht 1.803 m ('5\' 11"'$ )   Wt 106 kg (233 lb)   SpO2 97%   BMI 32.50 kg/m     HEENT: Normocephalic. Atraumatic. No lesions, tenderness or abnormalities, EYES: PERRLA, EOMI, conjunctiva wnl.  EARS: Bilateral TMs intact and clear.,  Neck: supple, no thyromegaly, no lymphadenopathy  Respiratory: Clear and equal breath sounds throughout, no respiratory distress  Heart: regular rate and rythum, no murmur, no edema, normal pulses  Abdomen: soft, non-tender, positive bowel sounds throughout  Extremities:  Does have edema bilaterally but more so on the left.  He has no posterior calf pain or tenderness and a negative Homans sign  Neuro: awake, alert and oriented x3, Cranial nerves intact, DTR's 2/4 throughout  Skin: warm and dry, no lesions or rashes , he does have some chronic changes of his left lower extremity especially greater than the right.  No ulcerations or skin breakdown.  No increased warmth or tenderness    ASSESSMENT/PLAN:     ICD-10-CM    1. Edema of both lower legs  R60.0 DME - Compression Stockings-I did send medical compression stockings 20-30 mmHg.  I am writing him a script for these today I also recommended elevating his legs as much as possible, decreasing salt and continuing his diuretics.  Will lymphedema clinic if his symptoms worsen.      2. Posterior neck pain  M54.2 MRI Spine Cervical WO Contrast-MRI ordered.  Will get him referred to Neurosurgery versus pain management based on the results.      3. Degenerative disc  disease, cervical  M50.30 MRI Spine Cervical WO Contrast      4. Mixed hyperlipidemia  E78.2 Patient has been having difficult time with the simvastatin he states his insurance does not want to pay for it anymore so I am switching him to Crestor which is ultimately at better statin  5. Cellulitis of left leg  L03.116 He did finish 2 courses of antibiotics.  His CBC, lactic acid and blood cultures are all negative.  I do not feel there was any need to treat him with anymore antibiotics.         Orders Placed This Encounter    MRI Spine Cervical WO Contrast    rosuvastatin (CRESTOR) 10 mg Oral Tablet    DME - Compression Stockings

## 2022-08-13 ENCOUNTER — Other Ambulatory Visit: Payer: Self-pay | Admitting: Internal Medicine

## 2022-08-17 ENCOUNTER — Other Ambulatory Visit (INDEPENDENT_AMBULATORY_CARE_PROVIDER_SITE_OTHER): Payer: Self-pay | Admitting: Family Medicine

## 2022-08-17 MED ORDER — APIXABAN 5 MG TABLET
5.0000 mg | ORAL_TABLET | Freq: Two times a day (BID) | ORAL | 1 refills | Status: DC
Start: 2022-08-17 — End: 2022-08-18

## 2022-08-17 NOTE — Telephone Encounter (Signed)
Regarding: Thomas Gammon, DO // Send Rx to [facility]  ----- Message from Velna Hatchet sent at 08/17/2022 11:49 AM EST -----  Thomas Gammon, DO    Pt called back in stating he needs the following Rx sent to [place] so he can use his insurance card to get the Rx.     --apixaban (ELIQUIS) 5 mg Oral Tablet, Take 1 Tablet (5 mg total) by mouth Twice daily SHIP TO PATIENT, [id number] STATE LICENSE- 0000000 Cook- [id number]    Preferred Pharmacy     [facility] [place] [address]    [address] [place] [address]    Phone: [phone] Fax: [phone]    Hours: Not open 24 hours     Thanks,  Autumn Whisper [employer]    ----- Message from Ladean Raya sent at 08/07/2022 12:22 PM EST -----  Thomas Gammon, DO  Gwen is needing shipping delivery preference   please fax to [phone]  for the medication below      Disp Refills Start End   apixaban (ELIQUIS) 5 mg Oral Tablet 180 Tablet 3 07/27/2022 -   Sig - Route: Take 1 Tablet (5 mg total) by mouth Twice daily SHIP TO PATIENT,  [id number]  STATE LICENSE- 0000000 L

## 2022-08-18 ENCOUNTER — Emergency Department (HOSPITAL_COMMUNITY): Payer: Medicare HMO

## 2022-08-18 ENCOUNTER — Inpatient Hospital Stay (HOSPITAL_COMMUNITY)
Admission: EM | Admit: 2022-08-18 | Discharge: 2022-08-20 | DRG: 291 | Disposition: A | Discharge status: Home / Self Care | Payer: Medicare HMO | Attending: Internal Medicine | Admitting: Internal Medicine

## 2022-08-18 ENCOUNTER — Other Ambulatory Visit (INDEPENDENT_AMBULATORY_CARE_PROVIDER_SITE_OTHER): Payer: Self-pay | Admitting: Family Medicine

## 2022-08-18 ENCOUNTER — Other Ambulatory Visit: Payer: Self-pay

## 2022-08-18 DIAGNOSIS — Z83438 Family history of other disorder of lipoprotein metabolism and other lipidemia: Secondary | ICD-10-CM

## 2022-08-18 DIAGNOSIS — Z743 Need for continuous supervision: Secondary | ICD-10-CM | POA: Diagnosis not present

## 2022-08-18 DIAGNOSIS — Z7901 Long term (current) use of anticoagulants: Secondary | ICD-10-CM

## 2022-08-18 DIAGNOSIS — Z86711 Personal history of pulmonary embolism: Secondary | ICD-10-CM

## 2022-08-18 DIAGNOSIS — Z87891 Personal history of nicotine dependence: Secondary | ICD-10-CM

## 2022-08-18 DIAGNOSIS — I480 Paroxysmal atrial fibrillation: Secondary | ICD-10-CM | POA: Diagnosis not present

## 2022-08-18 DIAGNOSIS — R0602 Shortness of breath: Secondary | ICD-10-CM | POA: Diagnosis not present

## 2022-08-18 DIAGNOSIS — N1831 Chronic kidney disease, stage 3a: Secondary | ICD-10-CM

## 2022-08-18 DIAGNOSIS — R0902 Hypoxemia: Secondary | ICD-10-CM | POA: Diagnosis not present

## 2022-08-18 DIAGNOSIS — Z6838 Body mass index (BMI) 38.0-38.9, adult: Secondary | ICD-10-CM | POA: Diagnosis not present

## 2022-08-18 DIAGNOSIS — Z885 Allergy status to narcotic agent status: Secondary | ICD-10-CM

## 2022-08-18 DIAGNOSIS — E7800 Pure hypercholesterolemia, unspecified: Secondary | ICD-10-CM | POA: Diagnosis not present

## 2022-08-18 DIAGNOSIS — R000 Tachycardia, unspecified: Secondary | ICD-10-CM | POA: Diagnosis not present

## 2022-08-18 DIAGNOSIS — I4510 Unspecified right bundle-branch block: Secondary | ICD-10-CM | POA: Diagnosis not present

## 2022-08-18 DIAGNOSIS — I482 Chronic atrial fibrillation, unspecified: Secondary | ICD-10-CM | POA: Diagnosis present

## 2022-08-18 DIAGNOSIS — I130 Hypertensive heart and chronic kidney disease with heart failure and stage 1 through stage 4 chronic kidney disease, or unspecified chronic kidney disease: Principal | ICD-10-CM | POA: Diagnosis present

## 2022-08-18 DIAGNOSIS — N183 Chronic kidney disease, stage 3 unspecified: Secondary | ICD-10-CM | POA: Diagnosis present

## 2022-08-18 DIAGNOSIS — E1141 Type 2 diabetes mellitus with diabetic mononeuropathy: Secondary | ICD-10-CM | POA: Diagnosis not present

## 2022-08-18 DIAGNOSIS — E1122 Type 2 diabetes mellitus with diabetic chronic kidney disease: Secondary | ICD-10-CM | POA: Diagnosis not present

## 2022-08-18 DIAGNOSIS — E785 Hyperlipidemia, unspecified: Secondary | ICD-10-CM | POA: Diagnosis not present

## 2022-08-18 DIAGNOSIS — G5793 Unspecified mononeuropathy of bilateral lower limbs: Secondary | ICD-10-CM | POA: Diagnosis not present

## 2022-08-18 DIAGNOSIS — R0600 Dyspnea, unspecified: Secondary | ICD-10-CM

## 2022-08-18 DIAGNOSIS — Z818 Family history of other mental and behavioral disorders: Secondary | ICD-10-CM

## 2022-08-18 DIAGNOSIS — I10 Essential (primary) hypertension: Secondary | ICD-10-CM | POA: Diagnosis not present

## 2022-08-18 DIAGNOSIS — E1169 Type 2 diabetes mellitus with other specified complication: Secondary | ICD-10-CM | POA: Diagnosis present

## 2022-08-18 DIAGNOSIS — E66812 Obesity, class 2: Secondary | ICD-10-CM | POA: Diagnosis present

## 2022-08-18 DIAGNOSIS — Z86718 Personal history of other venous thrombosis and embolism: Secondary | ICD-10-CM

## 2022-08-18 DIAGNOSIS — I4891 Unspecified atrial fibrillation: Secondary | ICD-10-CM | POA: Diagnosis not present

## 2022-08-18 DIAGNOSIS — Z8619 Personal history of other infectious and parasitic diseases: Secondary | ICD-10-CM

## 2022-08-18 DIAGNOSIS — M1990 Unspecified osteoarthritis, unspecified site: Secondary | ICD-10-CM | POA: Diagnosis present

## 2022-08-18 DIAGNOSIS — Z79899 Other long term (current) drug therapy: Secondary | ICD-10-CM | POA: Diagnosis not present

## 2022-08-18 DIAGNOSIS — E669 Obesity, unspecified: Secondary | ICD-10-CM | POA: Diagnosis not present

## 2022-08-18 DIAGNOSIS — Z833 Family history of diabetes mellitus: Secondary | ICD-10-CM | POA: Diagnosis not present

## 2022-08-18 DIAGNOSIS — G4733 Obstructive sleep apnea (adult) (pediatric): Secondary | ICD-10-CM | POA: Diagnosis present

## 2022-08-18 DIAGNOSIS — F32A Depression, unspecified: Secondary | ICD-10-CM | POA: Diagnosis not present

## 2022-08-18 DIAGNOSIS — I5023 Acute on chronic systolic (congestive) heart failure: Secondary | ICD-10-CM | POA: Diagnosis present

## 2022-08-18 DIAGNOSIS — F419 Anxiety disorder, unspecified: Secondary | ICD-10-CM | POA: Diagnosis present

## 2022-08-18 DIAGNOSIS — I110 Hypertensive heart disease with heart failure: Secondary | ICD-10-CM | POA: Diagnosis not present

## 2022-08-18 DIAGNOSIS — J9811 Atelectasis: Secondary | ICD-10-CM | POA: Diagnosis not present

## 2022-08-18 DIAGNOSIS — Z888 Allergy status to other drugs, medicaments and biological substances status: Secondary | ICD-10-CM

## 2022-08-18 DIAGNOSIS — N179 Acute kidney failure, unspecified: Secondary | ICD-10-CM | POA: Diagnosis present

## 2022-08-18 DIAGNOSIS — Z8701 Personal history of pneumonia (recurrent): Secondary | ICD-10-CM

## 2022-08-18 DIAGNOSIS — Z96652 Presence of left artificial knee joint: Secondary | ICD-10-CM | POA: Diagnosis present

## 2022-08-18 DIAGNOSIS — I4892 Unspecified atrial flutter: Secondary | ICD-10-CM | POA: Diagnosis present

## 2022-08-18 DIAGNOSIS — N400 Enlarged prostate without lower urinary tract symptoms: Secondary | ICD-10-CM | POA: Diagnosis present

## 2022-08-18 DIAGNOSIS — Z7985 Long-term (current) use of injectable non-insulin antidiabetic drugs: Secondary | ICD-10-CM

## 2022-08-18 DIAGNOSIS — R059 Cough, unspecified: Secondary | ICD-10-CM | POA: Diagnosis not present

## 2022-08-18 DIAGNOSIS — I340 Nonrheumatic mitral (valve) insufficiency: Secondary | ICD-10-CM | POA: Diagnosis not present

## 2022-08-18 DIAGNOSIS — I5021 Acute systolic (congestive) heart failure: Secondary | ICD-10-CM | POA: Diagnosis not present

## 2022-08-18 DIAGNOSIS — I509 Heart failure, unspecified: Secondary | ICD-10-CM | POA: Diagnosis not present

## 2022-08-18 LAB — COMPREHENSIVE METABOLIC PANEL
ALT: 16 U/L (ref 0–44)
AST: 25 U/L (ref 15–41)
Albumin: 3.4 g/dL — ABNORMAL LOW (ref 3.5–5.0)
Alkaline Phosphatase: 62 U/L (ref 38–126)
Anion gap: 13 (ref 5–15)
BUN: 23 mg/dL (ref 8–23)
CO2: 23 mmol/L (ref 22–32)
Calcium: 9.3 mg/dL (ref 8.9–10.3)
Chloride: 102 mmol/L (ref 98–111)
Creatinine, Ser: 1.45 mg/dL — ABNORMAL HIGH (ref 0.61–1.24)
GFR, Estimated: 50 mL/min — ABNORMAL LOW (ref >60)
Glucose, Bld: 128 mg/dL — ABNORMAL HIGH (ref 70–99)
Potassium: 4.3 mmol/L (ref 3.5–5.1)
Sodium: 138 mmol/L (ref 135–145)
Total Bilirubin: 1.8 mg/dL — ABNORMAL HIGH (ref 0.3–1.2)
Total Protein: 6.8 g/dL (ref 6.5–8.1)

## 2022-08-18 LAB — CBC
HCT: 48.5 % (ref 39.0–52.0)
Hemoglobin: 15 g/dL (ref 13.0–17.0)
MCH: 29.9 pg (ref 26.0–34.0)
MCHC: 30.9 g/dL (ref 30.0–36.0)
MCV: 96.8 fL (ref 80.0–100.0)
Platelets: 160 10*3/uL (ref 150–400)
RBC: 5.01 MIL/uL (ref 4.22–5.81)
RDW: 18.3 % — ABNORMAL HIGH (ref 11.5–15.5)
WBC: 8.3 10*3/uL (ref 4.0–10.5)
nRBC: 0 % (ref 0.0–0.2)

## 2022-08-18 LAB — GLUCOSE, CAPILLARY
Glucose-Capillary: 139 mg/dL — ABNORMAL HIGH (ref 70–99)
Glucose-Capillary: 118 mg/dL — ABNORMAL HIGH (ref 70–99)

## 2022-08-18 LAB — PROTIME-INR
INR: 1.1 (ref 0.8–1.2)
Prothrombin Time: 14.3 seconds (ref 11.4–15.2)

## 2022-08-18 MED ORDER — GABAPENTIN 300 MG PO CAPS
300.0000 mg | ORAL_CAPSULE | Freq: Three times a day (TID) | ORAL | Status: DC
  Administered 2022-08-18 – 2022-08-20 (×6): 300 mg via ORAL
  Filled 2022-08-18 (×6): qty 1

## 2022-08-18 MED ORDER — ACETAMINOPHEN 325 MG PO TABS: 650.0000 mg | ORAL_TABLET | Freq: Four times a day (QID) | ORAL | Status: DC | PRN

## 2022-08-18 MED ORDER — ADULT MULTIVITAMIN W/MINERALS CH
1.0000 | ORAL_TABLET | Freq: Every day | ORAL | Status: DC
  Administered 2022-08-18 – 2022-08-20 (×3): 1 via ORAL
  Filled 2022-08-18 (×3): qty 1

## 2022-08-18 MED ORDER — AMIODARONE HCL IN DEXTROSE 360-4.14 MG/200ML-% IV SOLN
60.0000 mg/h | INTRAVENOUS | Status: AC
  Administered 2022-08-18 (×2): 60 mg/h via INTRAVENOUS
  Filled 2022-08-18: qty 200

## 2022-08-18 MED ORDER — NYSTATIN 100000 UNIT/GM EX POWD
Freq: Two times a day (BID) | CUTANEOUS | Status: DC
  Administered 2022-08-20: 1 via TOPICAL
  Filled 2022-08-18: qty 15

## 2022-08-18 MED ORDER — INSULIN ASPART 100 UNIT/ML IJ SOLN: 0.0000 [IU] | Freq: Every day | INTRAMUSCULAR | Status: DC

## 2022-08-18 MED ORDER — AMIODARONE LOAD VIA INFUSION
150.0000 mg | Freq: Once | INTRAVENOUS | Status: AC
  Administered 2022-08-18: 150 mg via INTRAVENOUS
  Filled 2022-08-18: qty 83.34

## 2022-08-18 MED ORDER — BUPROPION HCL ER (SR) 150 MG PO TB12
150.0000 mg | ORAL_TABLET | Freq: Two times a day (BID) | ORAL | Status: DC
  Administered 2022-08-18 – 2022-08-20 (×5): 150 mg via ORAL
  Filled 2022-08-18 (×5): qty 1

## 2022-08-18 MED ORDER — DILTIAZEM LOAD VIA INFUSION
10.0000 mg | Freq: Once | INTRAVENOUS | Status: AC
  Administered 2022-08-18: 10 mg via INTRAVENOUS
  Filled 2022-08-18: qty 10

## 2022-08-18 MED ORDER — FUROSEMIDE 10 MG/ML IJ SOLN
40.0000 mg | Freq: Two times a day (BID) | INTRAMUSCULAR | Status: DC
  Administered 2022-08-18 – 2022-08-20 (×4): 40 mg via INTRAVENOUS
  Filled 2022-08-18 (×4): qty 4

## 2022-08-18 MED ORDER — HYDROCERIN EX CREA
TOPICAL_CREAM | Freq: Two times a day (BID) | CUTANEOUS | Status: DC
  Filled 2022-08-18: qty 113

## 2022-08-18 MED ORDER — FINASTERIDE 5 MG PO TABS
5.0000 mg | ORAL_TABLET | Freq: Every day | ORAL | Status: DC
  Administered 2022-08-18 – 2022-08-19 (×2): 5 mg via ORAL
  Filled 2022-08-18 (×2): qty 1

## 2022-08-18 MED ORDER — SODIUM CHLORIDE 0.9 % IV SOLN
2.0000 g | INTRAVENOUS | Status: DC
  Administered 2022-08-18: 2 g via INTRAVENOUS
  Filled 2022-08-18: qty 20

## 2022-08-18 MED ORDER — INSULIN ASPART 100 UNIT/ML IJ SOLN
0.0000 [IU] | Freq: Three times a day (TID) | INTRAMUSCULAR | Status: DC
  Administered 2022-08-18: 1 [IU] via SUBCUTANEOUS

## 2022-08-18 MED ORDER — NORTRIPTYLINE HCL 25 MG PO CAPS
25.0000 mg | ORAL_CAPSULE | Freq: Every day | ORAL | Status: DC
  Administered 2022-08-18 – 2022-08-19 (×2): 25 mg via ORAL
  Filled 2022-08-18 (×3): qty 1

## 2022-08-18 MED ORDER — AMIODARONE HCL IN DEXTROSE 360-4.14 MG/200ML-% IV SOLN
30.0000 mg/h | INTRAVENOUS | Status: DC
  Administered 2022-08-19: 30 mg/h via INTRAVENOUS
  Filled 2022-08-18 (×2): qty 200

## 2022-08-18 MED ORDER — APIXABAN 5 MG PO TABS
5.0000 mg | ORAL_TABLET | Freq: Two times a day (BID) | ORAL | Status: DC
  Administered 2022-08-18 – 2022-08-20 (×5): 5 mg via ORAL
  Filled 2022-08-18 (×5): qty 1

## 2022-08-18 MED ORDER — ATORVASTATIN CALCIUM 80 MG PO TABS
80.0000 mg | ORAL_TABLET | Freq: Every day | ORAL | Status: DC
  Administered 2022-08-18 – 2022-08-20 (×3): 80 mg via ORAL
  Filled 2022-08-18: qty 1
  Filled 2022-08-18: qty 2
  Filled 2022-08-18: qty 1

## 2022-08-18 MED ORDER — LORAZEPAM 2 MG/ML IJ SOLN
0.5000 mg | Freq: Once | INTRAMUSCULAR | Status: AC
  Administered 2022-08-18: 0.5 mg via INTRAVENOUS
  Filled 2022-08-18: qty 1

## 2022-08-18 MED ORDER — DILTIAZEM HCL-DEXTROSE 125-5 MG/125ML-% IV SOLN (PREMIX)
5.0000 mg/h | INTRAVENOUS | Status: DC
  Administered 2022-08-18: 5 mg/h via INTRAVENOUS
  Filled 2022-08-18: qty 125

## 2022-08-18 MED ORDER — APIXABAN 5 MG TABLET
5.0000 mg | ORAL_TABLET | Freq: Two times a day (BID) | ORAL | 1 refills | Status: DC
Start: 2022-08-18 — End: 2022-12-02

## 2022-08-18 NOTE — ED Triage Notes (Signed)
PT woke up this AM and became SOB.  Found to be tachy at 130 by EMS.  EMS states he appeared to be in afib so they gave '10mg'$  of Cardizem.  Pt was 110-120 bpm on arrival.  PT also received 120m of fluid.   Pt is A*O x4.  Chronic swelling to LE. Pt ambulates with a cane.

## 2022-08-18 NOTE — ED Provider Notes (Signed)
[facility] Provider Note   CSN: [id number] Arrival date & time: 08/18/22  [id number]     History  Chief Complaint  Patient presents with   Shortness of Breath    Tanner [name] is a 77 y.o. male.  HPI 77 year old male presents today complaining of shortness of breath.  Patient has history of A-fib and was found to be in A-fib with RVR rate of 130 by EMS.  Prehospital received 10 mg of Cardizem.  Home Medications Prior to Admission medications   Medication Sig Start Date End Date Taking? Authorizing Provider  acetaminophen (TYLENOL) 500 MG tablet Take 1,000 mg by mouth every 6 (six) hours as needed for moderate pain. 02/21/13   Norins, Heinz Knuckles, MD  acidophilus (RISAQUAD) CAPS capsule Take 1 capsule by mouth daily.    [provider]  amoxicillin-clavulanate (AUGMENTIN) 875-125 MG tablet Take 1 tablet by mouth every 12 (twelve) hours. 05/28/22   Rancour, Annie Main, MD  apixaban (ELIQUIS) 5 MG TABS tablet Take 5 mg by mouth 2 (two) times daily.    [provider]  atenolol (TENORMIN) 25 MG tablet Take 25 mg by mouth daily.    [provider]  atorvastatin (LIPITOR) 80 MG tablet Take 40 mg by mouth daily.    [provider]  buPROPion (WELLBUTRIN SR) 150 MG 12 hr tablet Take 150 mg by mouth 2 (two) times daily.    [provider]  clindamycin (CLEOCIN) 75 MG/5ML solution SMARTSIG:20 Milliliter(s) By Mouth 3 Times Daily 06/15/22   [provider]  clobetasol ointment (TEMOVATE) 0.05 % APPLY SMALL AMOUNT TOPICALLY EVERY DAY AS NEEDED DO NOT USE ON FACE OR GENITALS 09/15/21   [provider]  finasteride (PROSCAR) 5 MG tablet Take 5 mg by mouth at bedtime.    [provider]  gabapentin (NEURONTIN) 300 MG capsule Take 300-600 mg by mouth See admin instructions. Take 600 mg by mouth three times daily and  300 mg at night    [provider]  ibuprofen (ADVIL) 100 MG/5ML  suspension SMARTSIG:30 Milliliter(s) By Mouth Every 6 Hours PRN 06/15/22   [provider]  ketoconazole (NIZORAL) 2 % shampoo Apply 1 application topically every other day.    [provider]  lidocaine (XYLOCAINE) 2 % solution Use as directed 15 mLs in the mouth or throat as needed for mouth pain. 07/17/22   Hoyt Koch, MD  Menthol-Methyl Salicylate (THERA-GESIC PLUS) CREA Apply 1 application topically at bedtime. Apply to lower back    [provider]  Multiple Vitamins-Minerals (MULTIVITAMINS THER. W/MINERALS) TABS Take 1 tablet by mouth daily.    [provider]  nortriptyline (PAMELOR) 10 MG capsule 2 CAPSULES EVERY NIGHT AT BEDTIME 12/20/17   Patel, Donika K, DO  predniSONE (DELTASONE) 20 MG tablet Take 2 pills daily for 7 days then 1 pill daily for 7 days then 1/2 pill daily for 4 days then stop. 07/17/22   Hoyt Koch, MD  saccharomyces boulardii (FLORASTOR) 250 MG capsule Take 1 capsule (250 mg total) by mouth 2 (two) times daily. 05/31/17   Lavina Hamman, MD  Semaglutide, 1 MG/DOSE, 4 MG/3ML SOPN Inject 1 mg as directed once a week. 05/26/22   Hoyt Koch, MD  Semaglutide, 2 MG/DOSE, 8 MG/3ML SOPN Inject 2 mg as directed once a week. 05/26/22   Hoyt Koch, MD  sildenafil (VIAGRA) 100 MG tablet Take 100 mg by mouth as needed. 02/27/20  [provider]  torsemide (DEMADEX) 20 MG tablet Take 1-2 tablets (20-40 mg total) by mouth daily. 01/26/22   Hoyt Koch, MD      Allergies    Lisinopril, Amlodipine, and Codeine sulfate    Review of Systems   Review of Systems  Physical Exam Updated Vital Signs BP (!) 146/130   Pulse 73   Temp 97.6 F (36.4 C) (Oral)   Resp (!) 21   SpO2 95%  Physical Exam Vitals and nursing note reviewed.  Constitutional:      General: He is in acute distress.     Appearance: He is well-developed. He is obese. He is not ill-appearing.  HENT:     Head: Normocephalic.      Mouth/Throat:     Mouth: Mucous membranes are moist.  Eyes:     Pupils: Pupils are equal, round, and reactive to light.  Cardiovascular:     Rate and Rhythm: Tachycardia present. Rhythm irregular.  Pulmonary:     Effort: Pulmonary effort is normal.     Breath sounds: Normal breath sounds.  Abdominal:     General: Bowel sounds are normal.     Palpations: Abdomen is soft.  Musculoskeletal:     Cervical back: Normal range of motion.     Comments: Bilateral lower extremity erythema and swelling with right greater than left.  Patient states this is baseline for him.  Skin:    General: Skin is warm and dry.     Capillary Refill: Capillary refill takes less than 2 seconds.  Neurological:     General: No focal deficit present.     Mental Status: He is alert. He is disoriented.  Psychiatric:        Mood and Affect: Mood normal.        Behavior: Behavior normal.     ED Results / Procedures / Treatments   Labs (all labs ordered are listed, but only abnormal results are displayed) Labs Reviewed  CBC - Abnormal; Notable for the following components:      Result Value   RDW 18.3 (*)    All other components within normal limits  COMPREHENSIVE METABOLIC PANEL - Abnormal; Notable for the following components:   Glucose, Bld 128 (*)    Creatinine, Ser 1.45 (*)    Albumin 3.4 (*)    Total Bilirubin 1.8 (*)    GFR, Estimated 50 (*)    All other components within normal limits  PROTIME-INR    EKG EKG Interpretation  Date/Time:  Tuesday August 18 2022 10:03:41 EST Ventricular Rate:  125 PR Interval:    QRS Duration: 159 QT Interval:  359 QTC Calculation: 518 R Axis:   -58 Text Interpretation: Atrial fibrillation with rapid ventricular response Ventricular premature complex RBBB and LAFB Left ventricular hypertrophy Anterior Q waves, possibly due to LVH Confirmed by Pattricia Boss [phone]) on 08/18/2022 10:08:14 AM  Radiology DG Chest Port 1 View  Result Date: 08/18/2022 CLINICAL  DATA:  Cough. EXAM: PORTABLE CHEST 1 VIEW COMPARISON:  06/17/2022. FINDINGS: 1035 hours. Streaky opacity in the left lung base with patchy bibasilar opacities, may reflect some combination of atelectasis, aspiration or infection. Stable cardiac and mediastinal contours. No pleural effusion or pneumothorax. IMPRESSION: Patchy bibasilar opacities, may reflect some combination of atelectasis, aspiration or infection. Electronically Signed   By: Emmit Alexanders M.D.   On: 08/18/2022 10:52    Procedures .Critical Care  Performed by: Pattricia Boss, MD Authorized by: Pattricia Boss, MD   Critical  care provider statement:    Critical care time (minutes):  30   Critical care was time spent personally by me on the following activities:  Development of treatment plan with patient or surrogate, discussions with consultants, evaluation of patient's response to treatment, examination of patient, ordering and review of laboratory studies, ordering and review of radiographic studies, ordering and performing treatments and interventions, pulse oximetry, re-evaluation of patient's condition and review of old charts     Medications Ordered in ED Medications  diltiazem (CARDIZEM) 1 mg/mL load via infusion 10 mg (10 mg Intravenous Bolus from Bag 08/18/22 1045)    And  diltiazem (CARDIZEM) 125 mg in dextrose 5% 125 mL (1 mg/mL) infusion (15 mg/hr Intravenous Infusion Verify 08/18/22 1311)  apixaban (ELIQUIS) tablet 5 mg (5 mg Oral Given 08/18/22 1310)  cefTRIAXone (ROCEPHIN) 2 g in sodium chloride 0.9 % 100 mL IVPB (has no administration in time range)  furosemide (LASIX) injection 40 mg (has no administration in time range)  LORazepam (ATIVAN) injection 0.5 mg (0.5 mg Intravenous Given 08/18/22 1306)    ED Course/ Medical Decision Making/ A&P Clinical Course as of 08/18/22 1327  Tue Aug 18, 2022  1051 Chest x-Macguire Holsinger reviewed interpreted and compared to first prior without significant change noted on my interpretation [DR]   [id number] Complete metabolic panel is reviewed interpreted significant for creatinine increased to 1.45 CBC reviewed interpreted and within normal limits [DR]    Clinical Course User Index [DR] Pattricia Boss, MD                             Medical Decision Making Amount and/or Complexity of Data Reviewed Labs: ordered. Radiology: ordered.  Risk Prescription drug management.   77 year old male presents today with dyspnea.  Patient was found to be in A-fib with RVR.  Patient has known history of A-fib and takes Eliquis.  Did not take this morning's dose and was dosed here in the ED.  He has some dyspnea which I suspect is secondary to the rapid rate with some failure.  Chest x-[facility] Wachter shows some patchy infiltrates that I suspect are secondary to the A-fib not to infection.  He has not had fever chills or productive cough. He has had having some pain in the right leg since he has been here in the bed but I suspect that this is positional.  He has some ongoing redness and swelling with the right greater than left and is fairly erythematous, but patient states that this is baseline for him.  I have a low index of suspicion for DVT although it is possible that he has this.  He is on anticoagulation. Creatinine today is 1.45.  He has an elevated creatinine in the past but has also been down to 1.12 for his first prior Patient is required rate control here in the ED with IV Cardizem.  Patient had received prehospital Cardizem with no adverse reactions.  He is on a drip here.  He is a patient of [facility].  Consult was discussed with them and they will see. Discussed with hospitalist for admission.        Final Clinical Impression(s) / ED Diagnoses Final diagnoses:  Paroxysmal atrial fibrillation (HCC)  Atrial fibrillation with RVR (HCC)  Dyspnea, unspecified type    Rx / DC Orders ED Discharge Orders     None         Pattricia Boss, MD 08/18/22 1327

## 2022-08-18 NOTE — Consult Note (Addendum)
Cardiology Consultation   Patient ID: Tanner [name] MRN: [id number]; DOB: [birth year]  Admit date: 08/18/2022 Date of Consult: 08/18/2022  PCP:  Hoyt Koch, MD   [place] Providers Cardiologist:  None        Patient Profile:   Tanner [name] is a 77 y.o. male with a hx of mitral regurgitation, aortic regurgitation, chronic systolic heart failure, DM type II, DVT and PE (on eliquis), hypertension,  who is being seen 08/18/2022 for the evaluation of afib with RVR at the request of Dr. Jeanell Sparrow.  History of Present Illness:   Tanner [name] presented to the ED this morning via EMS after developing shortness of breath after waking up this morning. He says he just generally felt stress/anxious for no obvious reason. Denies palpitations, chest pain this AM. Patient says that prior to this morning, he has been feeling well, without baseline dyspnea or exertional limitation. He lives by himself and has no problems completing ADLs. Patient says that he had a similar episode of dyspnea in early January for which EMS transported him to the [facility] ED. Unfortunately appears that due to prolonged triage, he became frustrated and left prior to definitive diagnosis/management. Patient has a cardiac history notable for moderate MR and mild aortic regurgitation with chronic systolic heart failure. He was seen by Dr. Marlou Porch in October of 2023 at the request of patient's PCP, [name]. At that time it was felt that patient's dyspnea on exertion was multi-factorial. Of note, patient also wore a Zio heart monitor for 13 days in July of 2023 after he reported palpitations. This monitor did not find significant arrhythmia. Denies history of sleep apnea and reports negative sleep study in the past. Patient does not smoke and consumes about one alcoholic beverage per year.  Past Medical History:  Diagnosis Date   Acute kidney failure (HCC)    Anxiety    Arthritis    Back pain    BPH (benign  prostatic hypertrophy)    Clostridium difficile infection    Clotting disorder ([place])    Depression    Diabetes ([place]) 12/11/2016   type 2    DVT (deep venous thrombosis) (HCC)    DVT of deep femoral vein, right ([place]) 06/29/2018   Edema, lower extremity    High cholesterol    Hypertension    Joint pain    Low back pain potentially associated with spinal stenosis    Neuromuscular disorder (HCC)    Neuropathy of lower extremity    bilateral   OSA (obstructive sleep apnea)    pt denies    Osteoarthritis    PE (pulmonary embolism)    Pneumonia    hx of x 2    Ventral hernia     Past Surgical History:  Procedure Laterality Date   EYE SURGERY     HERNIA REPAIR  1999   TOTAL KNEE ARTHROPLASTY Left 08/26/2020   Procedure: LEFT TOTAL KNEE ARTHROPLASTY;  Surgeon: Frederik Pear, MD;  Location: [facility] ORS;  Service: Orthopedics;  Laterality: Left;     Home Medications:  Prior to Admission medications   Medication Sig Start Date End Date Taking? Authorizing Provider  acetaminophen (TYLENOL) 500 MG tablet Take 1,000 mg by mouth every 6 (six) hours as needed for moderate pain. 02/21/13   Norins, Heinz Knuckles, MD  acidophilus (RISAQUAD) CAPS capsule Take 1 capsule by mouth daily.    [provider]  amoxicillin-clavulanate (AUGMENTIN) 875-125 MG tablet Take 1 tablet by  mouth every 12 (twelve) hours. Patient not taking: Reported on 08/18/2022 05/28/22   Rancour, Annie Main, MD  apixaban (ELIQUIS) 5 MG TABS tablet Take 5 mg by mouth 2 (two) times daily.    [provider]  atenolol (TENORMIN) 25 MG tablet Take 25 mg by mouth daily.    [provider]  atorvastatin (LIPITOR) 80 MG tablet Take 40 mg by mouth daily.    [provider]  buPROPion (WELLBUTRIN SR) 150 MG 12 hr tablet Take 150 mg by mouth 2 (two) times daily.    [provider]  clindamycin (CLEOCIN) 75 MG/5ML solution SMARTSIG:20 Milliliter(s) By Mouth 3 Times Daily 06/15/22   [provider]   clobetasol ointment (TEMOVATE) [id number] % Apply 1 Application topically daily as needed. 09/15/21   [provider]  finasteride (PROSCAR) 5 MG tablet Take 5 mg by mouth at bedtime.    [provider]  gabapentin (NEURONTIN) 300 MG capsule Take 300-600 mg by mouth See admin instructions. Take 600 mg by mouth three times daily and  300 mg at night    [provider]  ibuprofen (ADVIL) 100 MG/5ML suspension SMARTSIG:30 Milliliter(s) By Mouth Every 6 Hours PRN Patient not taking: Reported on 08/18/2022 06/15/22   [provider]  ketoconazole (NIZORAL) 2 % shampoo Apply 1 application topically every other day.    [provider]  lidocaine (XYLOCAINE) 2 % solution Use as directed 15 mLs in the mouth or throat as needed for mouth pain. 07/17/22   Hoyt Koch, MD  Menthol-Methyl Salicylate (THERA-GESIC PLUS) CREA Apply 1 application topically at bedtime. Apply to lower back    [provider]  Multiple Vitamins-Minerals (MULTIVITAMINS THER. W/MINERALS) TABS Take 1 tablet by mouth daily.    [provider]  nortriptyline (PAMELOR) 10 MG capsule 2 CAPSULES EVERY NIGHT AT BEDTIME Patient taking differently: Take 20 mg by mouth at bedtime. 12/20/17   Patel, Arvin Collard K, DO  predniSONE (DELTASONE) 20 MG tablet Take 2 pills daily for 7 days then 1 pill daily for 7 days then 1/2 pill daily for 4 days then stop. Patient not taking: Reported on 08/18/2022 07/17/22   Hoyt Koch, MD  saccharomyces boulardii (FLORASTOR) 250 MG capsule Take 1 capsule (250 mg total) by mouth 2 (two) times daily. 05/31/17   Lavina Hamman, MD  Semaglutide, 1 MG/DOSE, 4 MG/3ML SOPN Inject 1 mg as directed once a week. 05/26/22   Hoyt Koch, MD  Semaglutide, 2 MG/DOSE, 8 MG/3ML SOPN Inject 2 mg as directed once a week. 05/26/22   Hoyt Koch, MD  sildenafil (VIAGRA) 100 MG tablet Take 100 mg by mouth as needed. 02/27/20   [provider]   torsemide (DEMADEX) 20 MG tablet Take 1-2 tablets (20-40 mg total) by mouth daily. 01/26/22   Hoyt Koch, MD    Inpatient Medications: Scheduled Meds:  amiodarone  150 mg Intravenous Once   apixaban  5 mg Oral BID   atorvastatin  80 mg Oral Daily   buPROPion ER  150 mg Oral BID   finasteride  5 mg Oral QHS   furosemide  40 mg Intravenous BID   gabapentin  300 mg Oral TID   hydrocerin   Topical BID   insulin aspart  0-5 Units Subcutaneous QHS   insulin aspart  0-9 Units Subcutaneous TID WC   multivitamin with minerals  1 tablet Oral Daily   nortriptyline  25 mg Oral QHS   Continuous Infusions:  amiodarone     Followed by   amiodarone     cefTRIAXone (ROCEPHIN)  IV 2 g (08/18/22 1421)   PRN Meds:   Allergies:    Allergies  Allergen Reactions   Lisinopril Other (See Comments)    Other reaction(s): Renal impairment   Amlodipine Swelling   Codeine Sulfate Itching and Nausea Only    Social History:   Social History   Socioeconomic History   Marital status: Widowed    Spouse name: Not on file   Number of children: 2   Years of education: Not on file   Highest education level: Not on file  Occupational History   Occupation: Retired  Tobacco Use   Smoking status: Former    Packs/day: 2.00    Years: 33.00    Total pack years: 66.00    Types: Cigarettes    Start date: 08/30/1968    Quit date: 07/02/2001    Years since quitting: 21.1   Smokeless tobacco: Never   Tobacco comments:    quit 12 years ago  Vaping Use   Vaping Use: Never used  Substance and Sexual Activity   Alcohol use: Yes    Comment: seldom    Drug use: No   Sexual activity: Not Currently  Other Topics Concern   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    Social Determinants of Health   Financial Resource Strain: Low Risk   (11/25/2021)   Overall Financial Resource Strain (CARDIA)    Difficulty of Paying Living Expenses: Not hard at all  Food Insecurity: No Food Insecurity (11/25/2021)   Hunger Vital Sign    Worried About Running Out of Food in the Last Year: Never true    Ran Out of Food in the Last Year: Never true  Transportation Needs: No Transportation Needs (11/25/2021)   PRAPARE - Hydrologist (Medical): No    Lack of Transportation (Non-Medical): No  Physical Activity: Sufficiently Active (11/25/2021)   Exercise Vital Sign    Days of Exercise per Week: 4 days    Minutes of Exercise per Session: 60 min  Stress: No Stress Concern Present (11/25/2021)   [place]    Feeling of Stress : Not at all  Social Connections: Unknown (11/25/2021)   Social Connection and Isolation Panel [NHANES]    Frequency of Communication with Friends and Family: Patient refused    Frequency of Social Gatherings with Friends and Family: Patient refused    Attends Religious Services: Patient refused    Active Member of Clubs or Organizations: Patient refused    Attends Archivist Meetings: Patient refused    Marital Status: Not on file  Intimate Partner Violence: Not At Risk (11/25/2021)   Humiliation, Afraid, Rape, and Kick questionnaire    Fear of Current or Ex-Partner: No    Emotionally Abused: No    Physically Abused: No    Sexually Abused: No    Family History:    Family History  Problem Relation Age of Onset   Diabetes Mother    Pneumonia Mother    Alzheimer's disease Mother    Hyperlipidemia Mother    Thyroid disease Mother    Depression Mother    Other  Father 64       Drowned on boating accident   Colon cancer Neg Hx    Colon polyps Neg Hx      ROS:  Please see the history of present illness.   All other ROS reviewed and negative.     Physical Exam/Data:   Vitals:   08/18/22 1230 08/18/22 1245  08/18/22 1300 08/18/22 1330  BP: (!) 197/78  (!) 146/130 (!) 167/90  Pulse: (!) 102 (!) 101 73 76  Resp: (!) 24 (!) 21 (!) 21 (!) 24  Temp:      TempSrc:      SpO2: 98% 97% 95% 93%    Intake/Output Summary (Last 24 hours) at 08/18/2022 1512 Last data filed at 08/18/2022 1311 Gross per 24 hour  Intake 50.05 ml  Output --  Net 50.05 ml      07/17/2022    3:06 PM 06/24/2022    9:28 AM 06/17/2022   10:22 AM  Last 3 Weights  Weight (lbs) 239 lb 243 lb 262 lb 5.6 oz  Weight (kg) 108.41 kg 110.224 kg 119 kg     There is no height or weight on file to calculate BMI.  General:  Well nourished, well developed, in no acute distress HEENT: normal Neck: no JVD Vascular: No carotid bruits; Distal pulses 2+ bilaterally Cardiac:  irregularly irregular rate/rhythm; faint diastolic murmur at apex Lungs:  clear to auscultation bilaterally, no wheezing, rhonchi or rales  Abd: soft, nontender, no hepatomegaly  Ext: bilateral edema in lower extremities R>L (baseline per patient). Minimal pitting. Right leg appears to be acutely erythematous, cellulitic appearing. Musculoskeletal:  No deformities, BUE and BLE strength normal and equal Skin: warm and dry  Neuro:  CNs 2-12 intact, no focal abnormalities noted Psych:  Normal affect   EKG:  The EKG was personally reviewed and demonstrates:  afib with RVR. RBBB (known) with LAFB Telemetry:  Telemetry was personally reviewed and demonstrates:  afib with RVR  Relevant CV Studies:  02/19/22 TTE  IMPRESSIONS     1. Left ventricular ejection fraction, by estimation, is 45 to 50%. The  left ventricle has mildly decreased function. The left ventricle  demonstrates global hypokinesis. The left ventricular internal cavity size  was mildly dilated. Left ventricular  diastolic parameters are consistent with Grade I diastolic dysfunction  (impaired relaxation).   2. Right ventricular systolic function is normal. The right ventricular  size is normal.  Tricuspid regurgitation signal is inadequate for assessing  PA pressure.   3. Left atrial size was mildly dilated.   4. The mitral valve is normal in structure. Moderate mitral valve  regurgitation.   5. The aortic valve is tricuspid. Aortic valve regurgitation is trivial.  No aortic stenosis is present.   6. The inferior vena cava is normal in size with greater than 50%  respiratory variability, suggesting right atrial pressure of 3 mmHg.   Comparison(s): A prior study was performed on 05/30/2017. Prior images  reviewed side by side. Changes from prior study are noted. The left  ventricular function is worsened. The left ventricle is slightly dilated.  There is new moderate mitral  insufficiency with an eccentric jet.   FINDINGS   Left Ventricle: Left ventricular ejection fraction, by estimation, is 45  to 50%. The left ventricle has mildly decreased function. The left  ventricle demonstrates global hypokinesis. Definity contrast agent was  given IV to delineate the left ventricular   endocardial borders. The left ventricular internal cavity size was  mildly  dilated. There is no left ventricular hypertrophy. Left ventricular  diastolic parameters are consistent with Grade I diastolic dysfunction  (impaired relaxation). Indeterminate  filling pressures.   Right Ventricle: The right ventricular size is normal. Right vetricular  wall thickness was not well visualized. Right ventricular systolic  function is normal. Tricuspid regurgitation signal is inadequate for  assessing PA pressure.   Left Atrium: Left atrial size was mildly dilated.   Right Atrium: Right atrial size was not well visualized.   Pericardium: There is no evidence of pericardial effusion.   Mitral Valve: The mitral valve is normal in structure. Moderate mitral  valve regurgitation, with eccentric posteriorly directed jet.   Tricuspid Valve: The tricuspid valve is normal in structure. Tricuspid  valve  regurgitation is not demonstrated.   Aortic Valve: The aortic valve is tricuspid. Aortic valve regurgitation is  trivial. No aortic stenosis is present.   Pulmonic Valve: The pulmonic valve was normal in structure. Pulmonic valve  regurgitation is not visualized.   Aorta: The aortic root and ascending aorta are structurally normal, with  no evidence of dilitation.   Venous: The inferior vena cava is normal in size with greater than 50%  respiratory variability, suggesting right atrial pressure of 3 mmHg.   IAS/Shunts: No atrial level shunt detected by color flow Doppler.   Laboratory Data:  High Sensitivity Troponin:  No results for input(s): "TROPONINIHS" in the last 720 hours.   Chemistry Recent Labs  Lab 08/18/22 1033  NA 138  K 4.3  CL 102  CO2 23  GLUCOSE 128*  BUN 23  CREATININE 1.45*  CALCIUM 9.3  GFRNONAA 50*  ANIONGAP 13    Recent Labs  Lab 08/18/22 1033  PROT 6.8  ALBUMIN 3.4*  AST 25  ALT 16  ALKPHOS 62  BILITOT 1.8*   Lipids No results for input(s): "CHOL", "TRIG", "HDL", "LABVLDL", "LDLCALC", "CHOLHDL" in the last 168 hours.  Hematology Recent Labs  Lab 08/18/22 1033  WBC 8.3  RBC 5.01  HGB 15.0  HCT 48.5  MCV 96.8  MCH 29.9  MCHC 30.9  RDW 18.3*  PLT 160   Thyroid No results for input(s): "TSH", "FREET4" in the last 168 hours.  BNPNo results for input(s): "BNP", "PROBNP" in the last 168 hours.  DDimer No results for input(s): "DDIMER" in the last 168 hours.   Radiology/Studies:  DG Chest Port 1 View  Result Date: 08/18/2022 CLINICAL DATA:  Cough. EXAM: PORTABLE CHEST 1 VIEW COMPARISON:  06/17/2022. FINDINGS: 1035 hours. Streaky opacity in the left lung base with patchy bibasilar opacities, may reflect some combination of atelectasis, aspiration or infection. Stable cardiac and mediastinal contours. No pleural effusion or pneumothorax. IMPRESSION: Patchy bibasilar opacities, may reflect some combination of atelectasis, aspiration or  infection. Electronically Signed   By: Emmit Alexanders M.D.   On: 08/18/2022 10:52     Assessment and Plan:   Atrial fibrillation with RVR  Patient with acute dyspnea this morning, found to be in afib with RVR by EMS. Now on diltiazem as ordered by ED provider to control rates. No obvious provocation for afib/RVR today.   Suspect that patient may have been having paroxysmal atrial fibrillation for up to the last 8 months as he has reported palpitations and/or dyspnea on several occasions. Unfortunately, a heart monitor worn last July did not catch significant arrhythmia. As patient is on life long [place] due to hx DVT/PE and has not missed doses, will more aggressively pursue a rate control  strategy. Load amiodarone today with plans for DCCV tomorrow. Stop Diltiazem. Continue Eliquis '5mg'$  BID.  Chronic HFmrEF Moderate MR  Patient with LVEF noted to be decreased 45-50% on September 2023 TTE. Also found with moderate mitral regurgitation. Patient on torsemide '20mg'$  per PCP and reports stable weights. Has lower extremity edema that is chronic.   Patient with notable lower extremity edema, right>left with erythema though no pulmonary edema or elevation of JVP noted on physical exam. Suspect that lower extremity edema is primarily driven by venous insufficiency. Patchy bibasilar opacities on CXR not clearly HF driven. Check TTE tomorrow. If patient has evidence of intravascular volume overload, could more aggressively diurese. For now, would continue with regular home Torsemide dosing especially with baseline CKD. Continue home atenolol '25mg'$ . Consider adding MRA/SGLT2i this admission.  Hyperlipidemia  LDL at goal last check. Continue Atorvastatin '80mg'$ .  Lab Results  Component Value Date   LDLCALC 41 12/04/2021   Hx DVT  Right lower extremity edema>left though patient says this is chronic/not new. Consider lower extremity dopplers. Continue Eliquis '5mg'$ .   Risk Assessment/Risk Scores:         New York Heart Association (NYHA) Functional Class NYHA Class I  CHA2DS2-VASc Score = 5   This indicates a 7.2% annual risk of stroke. [name] score is based upon: CHF History: 1 HTN History: 1 Diabetes History: 1 Stroke History: 0 Vascular Disease History: 0 Age Score: 2 Gender Score: 0         For questions or updates, please contact [place] Please consult [email or web address] for contact info under    Signed, Shelva Majestic, MD  08/18/2022 3:12 PM  Patient seen and examined. Agree with assessment and plan.  Tanner [name] is a 77 year old gentleman who is followed by Dr. Marlou Porch and has a history of mitral and aortic regurgitation, chronic systolic heart failure with EF at 31 to 50% on last echo on February 19, 2022 as well as remote DVT and PE on Eliquis, hypertension, venous insufficiency, and type 2 diabetes mellitus.  Patient admits to 100% compliance with his twice daily dosing of Eliquis and has not missed any doses.  This morning, he developed shortness of breath after awakening he presented to the emergency room and was found to be in atrial fibrillation with RVR.  He has been started on Cardizem drip at 15 mg/h and continues to be in AF in the 125 range.  Has been on atenolol 25 mg daily for hypertension, atorvastatin 40 mg for hyperlipidemia and takes torsemide 20 mg for lower extremity edema and venous stasis changes.  Lower extremity Doppler study in July was negative for DVT.  The patient admits to using compression stockings at home.  As only, he denies any chest pain.  Blood pressure is elevated at 150/86.  Heart rate in the 120s.  HEENT is unremarkable.  There is no significant JVD.  Lungs are clear without wheezing or rales.  Rhythm is irregularly irregular and tachycardic with 1/6 to 2/6 systolic murmur the apex.  He has central adiposity.  Bowel sounds are positive.  There is 3+ right and 2+ left lower extremity edema with venous stasis changes.   Neurologic exam is grossly nonfocal.  Laboratory is notable for creatinine increased at 1.45 consistent with stage IIIa CKD.  Potassium 4.3.  Hemoglobin/hematocrit 15/48.5.  Chest x-ray reveals patchy bibasilar opacities.  Agree  with plans to initiate IV amiodarone bolus plus infusion to see if he can pharmacologically cardiovert.  Since he  has not missed any Eliquis doses, tentatively plan for DC cardioversion tomorrow if he maintains in atrial fibrillation.  May need dose adjustment to his torsemide but will not increase presently.  Troy Sine, MD, [facility] 08/18/2022 3:12 PM

## 2022-08-18 NOTE — Progress Notes (Signed)
   Patient seen by me again this evening. Remains in afib with RVR on Amiodarone. Continues to be relatively asymptomatic. Discussed plans for DCCV tomorrow and will make him NPO at midnight in preparation for this.  Shared Decision Making/Informed Consent The risks (stroke, cardiac arrhythmias rarely resulting in the need for a temporary or permanent pacemaker, skin irritation or burns and complications associated with conscious sedation including aspiration, arrhythmia, respiratory failure and death), benefits (restoration of normal sinus rhythm) and alternatives of a direct current cardioversion were explained in detail to Tanner [name] and he agrees to proceed.   Lily Kocher, PA-C

## 2022-08-18 NOTE — ED Notes (Signed)
Additional 20 mg bolus of Cardizem given per Dr. Jeanell Sparrow.

## 2022-08-18 NOTE — H&P (Signed)
History and Physical    Patient: Tanner [name] [id number] DOB: [birth year] DOA: 08/18/2022 DOS: the patient was seen and examined on 08/18/2022 PCP: Hoyt Koch, MD  Patient coming from: Home  Chief Complaint:  Chief Complaint  Patient presents with   Shortness of Breath   HPI: Tanner [name] is a 77 y.o. male with medical history significant of systolic CHF who presents to the hospital for SOB. He was noted to be in afib to the 130s per EMS.  Pt reports that his sob started this morning and has had to sleep in the recliner (as he is unable to lay flat).  He denies any cough, chest pain, fever or chills.  Exercise tolerance is poor and he ambulates with a wooden cane stick.  He also endorses lower leg swelling with redness in the b/l lower legs. In the ER he was started on a cardizem drip. CXR showed patchy bibasilar opacities. As a result, the pt will be admitted to the medicine service.  Review of Systems: As mentioned in the history of present illness. All other systems reviewed and are negative. Past Medical History:  Diagnosis Date   Acute kidney failure (HCC)    Anxiety    Arthritis    Back pain    BPH (benign prostatic hypertrophy)    Clostridium difficile infection    Clotting disorder ([place])    Depression    Diabetes ([place]) 12/11/2016   type 2    DVT (deep venous thrombosis) (HCC)    DVT of deep femoral vein, right ([place]) 06/29/2018   Edema, lower extremity    High cholesterol    Hypertension    Joint pain    Low back pain potentially associated with spinal stenosis    Neuromuscular disorder (HCC)    Neuropathy of lower extremity    bilateral   OSA (obstructive sleep apnea)    pt denies    Osteoarthritis    PE (pulmonary embolism)    Pneumonia    hx of x 2    Ventral hernia    Past Surgical History:  Procedure Laterality Date   EYE SURGERY     HERNIA REPAIR  1999   TOTAL KNEE ARTHROPLASTY Left 08/26/2020   Procedure: LEFT TOTAL KNEE ARTHROPLASTY;   Surgeon: Frederik Pear, MD;  Location: [facility];  Service: Orthopedics;  Laterality: Left;   Social History:  reports that he quit smoking about 21 years ago. His smoking use included cigarettes. He started smoking about 54 years ago. He has a 66.00 pack-year smoking history. He has never used smokeless tobacco. He reports current alcohol use. He reports that he does not use drugs.  Allergies  Allergen Reactions   Lisinopril Other (See Comments)    Other reaction(s): Renal impairment   Amlodipine Swelling   Codeine Sulfate Itching and Nausea Only    Family History  Problem Relation Age of Onset   Diabetes Mother    Pneumonia Mother    Alzheimer's disease Mother    Hyperlipidemia Mother    Thyroid disease Mother    Depression Mother    Other Father 45       Drowned on boating accident   Colon cancer Neg Hx    Colon polyps Neg Hx     Prior to Admission medications   Medication Sig Start Date End Date Taking? Authorizing Provider  acetaminophen (TYLENOL) 500 MG tablet Take 1,000 mg by mouth every 6 (six) hours as needed for moderate pain. 02/21/13  Norins, Heinz Knuckles, MD  acidophilus (RISAQUAD) CAPS capsule Take 1 capsule by mouth daily.    [provider]  amoxicillin-clavulanate (AUGMENTIN) 875-125 MG tablet Take 1 tablet by mouth every 12 (twelve) hours. 05/28/22   Rancour, Annie Main, MD  apixaban (ELIQUIS) 5 MG TABS tablet Take 5 mg by mouth 2 (two) times daily.    [provider]  atenolol (TENORMIN) 25 MG tablet Take 25 mg by mouth daily.    [provider]  atorvastatin (LIPITOR) 80 MG tablet Take 40 mg by mouth daily.    [provider]  buPROPion (WELLBUTRIN SR) 150 MG 12 hr tablet Take 150 mg by mouth 2 (two) times daily.    [provider]  clindamycin (CLEOCIN) 75 MG/5ML solution SMARTSIG:20 Milliliter(s) By Mouth 3 Times Daily 06/15/22   [provider]  clobetasol ointment (TEMOVATE) 0.05 % APPLY SMALL AMOUNT TOPICALLY EVERY  DAY AS NEEDED DO NOT USE ON FACE OR GENITALS 09/15/21   [provider]  finasteride (PROSCAR) 5 MG tablet Take 5 mg by mouth at bedtime.    [provider]  gabapentin (NEURONTIN) 300 MG capsule Take 300-600 mg by mouth See admin instructions. Take 600 mg by mouth three times daily and  300 mg at night    [provider]  ibuprofen (ADVIL) 100 MG/5ML suspension SMARTSIG:30 Milliliter(s) By Mouth Every 6 Hours PRN 06/15/22   [provider]  ketoconazole (NIZORAL) 2 % shampoo Apply 1 application topically every other day.    [provider]  lidocaine (XYLOCAINE) 2 % solution Use as directed 15 mLs in the mouth or throat as needed for mouth pain. 07/17/22   Hoyt Koch, MD  Menthol-Methyl Salicylate (THERA-GESIC PLUS) CREA Apply 1 application topically at bedtime. Apply to lower back    [provider]  Multiple Vitamins-Minerals (MULTIVITAMINS THER. W/MINERALS) TABS Take 1 tablet by mouth daily.    [provider]  nortriptyline (PAMELOR) 10 MG capsule 2 CAPSULES EVERY NIGHT AT BEDTIME 12/20/17   Patel, Donika K, DO  predniSONE (DELTASONE) 20 MG tablet Take 2 pills daily for 7 days then 1 pill daily for 7 days then 1/2 pill daily for 4 days then stop. 07/17/22   Hoyt Koch, MD  saccharomyces boulardii (FLORASTOR) 250 MG capsule Take 1 capsule (250 mg total) by mouth 2 (two) times daily. 05/31/17   Lavina Hamman, MD  Semaglutide, 1 MG/DOSE, 4 MG/3ML SOPN Inject 1 mg as directed once a week. 05/26/22   Hoyt Koch, MD  Semaglutide, 2 MG/DOSE, 8 MG/3ML SOPN Inject 2 mg as directed once a week. 05/26/22   Hoyt Koch, MD  sildenafil (VIAGRA) 100 MG tablet Take 100 mg by mouth as needed. 02/27/20   [provider]  torsemide (DEMADEX) 20 MG tablet Take 1-2 tablets (20-40 mg total) by mouth daily. 01/26/22   Hoyt Koch, MD    Physical Exam: Vitals:   08/18/22 1225 08/18/22 1230 08/18/22  1245 08/18/22 1300  BP: (!) 175/101 (!) 197/78  (!) 146/130  Pulse: 99 (!) 102 (!) 101 73  Resp: 18 (!) 24 (!) 21 (!) 21  Temp:      TempSrc:      SpO2: 97% 98% 97% 95%   Physical Exam HENT:     Head: Normocephalic and atraumatic.  Eyes:     Pupils: Pupils are equal, round, and reactive to light.  Cardiovascular:     Rate and Rhythm: Tachycardia present.  Pulmonary:  Effort: Pulmonary effort is normal.     Comments: Decreased breath sounds at the bases  Abdominal:     Palpations: Abdomen is soft.     Comments: Obese  Musculoskeletal:     Cervical back: Neck supple.  Skin:    General: Skin is warm and dry.     Comments: B/l lower leg erythema   Neurological:     Mental Status: He is alert.     Comments: At baseline   Psychiatric:        Mood and Affect: Mood normal.     Data Reviewed:  There are no new results to review at this time.  Assessment and Plan: Acute systolic CHF exacerbation  - ECHO  - IV lasix 40 mg bid - Cardiology consulted by the ER provider - Cardiac monitoring  - Daily weights - Strict I and O    Chronic afib (now with RVR) - IV cardizem drip - Eliquis 5 mg PO bid  - TSH/Free T4  B/l lower leg cellulitis  - IV ceftriaxone 2 g daily   DM - A1c - Novolog SS ACHS   DVT prophylaxis: Eliquis 5 mg PO bid    Advance Care Planning:   Code Status: Prior FULL   Consults: Cardiology  Severity of Illness: The appropriate patient status for this patient is INPATIENT. Inpatient status is judged to be reasonable and necessary in order to provide the required intensity of service to ensure the patient's safety. [name] presenting symptoms, physical exam findings, and initial radiographic and laboratory data in the context of their chronic comorbidities is felt to place them at high risk for further clinical deterioration. Furthermore, it is not anticipated that the patient will be medically stable for discharge from the hospital within 2  midnights of admission.   * I certify that at the point of admission it is my clinical judgment that the patient will require inpatient hospital care spanning beyond 2 midnights from the point of admission due to high intensity of service, high risk for further deterioration and high frequency of surveillance required.*  Author: Lucienne Minks , MD 08/18/2022 1:26 PM  For on call review [email or web address].

## 2022-08-18 NOTE — ED Notes (Signed)
ED TO INPATIENT HANDOFF REPORT  ED Nurse Name and Phone #: Iona Coach Name/Age/Gender Lizabeth Leyden 77 y.o. male Room/Bed: 015C/015C  Code Status   Code Status: Prior  Home/SNF/Other Home Patient oriented to: self, place, time, and situation Is this baseline? Yes   Triage Complete: Triage complete  Chief Complaint Acute systolic CHF (congestive heart failure) ([place]) [I50.21]  Triage Note PT woke up this AM and became SOB.  Found to be tachy at 130 by EMS.  EMS states he appeared to be in afib so they gave '10mg'$  of Cardizem.  Pt was 110-120 bpm on arrival.  PT also received 162m of fluid.   Pt is A*O x4.  Chronic swelling to LE. Pt ambulates with a cane.    Allergies Allergies  Allergen Reactions   Lisinopril Other (See Comments)    Other reaction(s): Renal impairment   Amlodipine Swelling   Codeine Sulfate Itching and Nausea Only    Level of Care/Admitting Diagnosis ED Disposition     ED Disposition  Admit   Condition  --   Comment  [facility]: [id number]]  Level of Care: Telemetry Cardiac [103]  [facility] equivalent level of care is available:: Yes  Covid Evaluation: Asymptomatic - no recent exposure (last 10 days) testing not required  Diagnosis: Acute systolic CHF (congestive heart failure) ([facility] [[id number] Admitting Physician: SLucienne Minks[[id number] Attending Physician: SLucienne Minks[[id number] Certification:: I certify this patient will need inpatient services for at least 2 midnights  Estimated Length of Stay: 3          B Medical/Surgery History Past Medical History:  Diagnosis Date   Acute kidney failure ([place]    Anxiety    Arthritis    Back pain    BPH (benign prostatic hypertrophy)    Clostridium difficile infection    Clotting disorder ([facility] [facility] [place]    Depression    Diabetes ([facility] 12/11/2016   type 2    DVT (deep venous thrombosis) ([facility]    DVT of deep femoral vein, right  ([facility] 06/29/2018   Edema, lower extremity    High cholesterol    Hypertension    Joint pain    Low back pain potentially associated with spinal stenosis    Neuromuscular disorder (HCC)    Neuropathy of lower extremity    bilateral   OSA (obstructive sleep apnea)    pt denies    Osteoarthritis    PE (pulmonary embolism)    Pneumonia    hx of x 2    Ventral hernia    Past Surgical History:  Procedure Laterality Date   EYE SURGERY     HERNIA REPAIR  1999   TOTAL KNEE ARTHROPLASTY Left 08/26/2020   Procedure: LEFT TOTAL KNEE ARTHROPLASTY;  Surgeon: RFrederik Pear MD;  Location[facility];  Service: Orthopedics;  Laterality: Left;     A IV Location/Drains/Wounds Patient Lines/Drains/Airways Status     Active Line/Drains/Airways     Name Placement date Placement time Site Days   Peripheral IV 08/18/22 20 G Posterior;Right Hand 08/18/22  1005  Hand  less than 1   Peripheral IV 08/18/22 18 G Posterior;Right Forearm 08/18/22  1043  Forearm  less than 1            Intake/Output Last 24 hours  Intake/Output Summary (Last 24 hours) at 08/18/2022 1351 Last data filed at 08/18/2022 1311 Gross per 24 hour  Intake 50.05 ml  Output --  Net 50.05 ml    Labs/Imaging Results for orders placed or performed during the hospital encounter of 08/18/22 (from the past 48 hour(s))  CBC     Status: Abnormal   Collection Time: 08/18/22 10:33 AM  Result Value Ref Range   WBC 8.3 4.0 - 10.5 K/uL   RBC 5.01 4.22 - 5.81 MIL/uL   Hemoglobin 15.0 13.0 - 17.0 g/dL   HCT 48.5 39.0 - 52.0 %   MCV 96.8 80.0 - 100.0 fL   MCH 29.9 26.0 - 34.0 pg   MCHC 30.9 30.0 - 36.0 g/dL   RDW 18.3 (H) 11.5 - 15.5 %   Platelets 160 150 - 400 K/uL   nRBC 0.0 0.0 - 0.2 %    Comment: Performed at [facility], [place] [address], [place], [place] [address]  Comprehensive metabolic panel     Status: Abnormal   Collection Time: 08/18/22 10:33 AM  Result Value Ref Range   Sodium 138 135 - 145 mmol/L   Potassium 4.3  3.5 - 5.1 mmol/L   Chloride 102 98 - 111 mmol/L   CO2 23 22 - 32 mmol/L   Glucose, Bld 128 (H) 70 - 99 mg/dL    Comment: Glucose reference range applies only to samples taken after fasting for at least 8 hours.   BUN 23 8 - 23 mg/dL   Creatinine, Ser 1.45 (H) 0.61 - 1.24 mg/dL   Calcium 9.3 8.9 - 10.3 mg/dL   Total Protein 6.8 6.5 - 8.1 g/dL   Albumin 3.4 (L) 3.5 - 5.0 g/dL   AST 25 15 - 41 U/L   ALT 16 0 - 44 U/L   Alkaline Phosphatase 62 38 - 126 U/L   Total Bilirubin 1.8 (H) 0.3 - 1.2 mg/dL   GFR, Estimated 50 (L) >60 mL/min    Comment: (NOTE) Calculated using the CKD-EPI Creatinine Equation (2021)    Anion gap 13 5 - 15    Comment: Performed at [place] [address], [place], [place] [address]  Protime-INR     Status: None   Collection Time: 08/18/22 10:33 AM  Result Value Ref Range   Prothrombin Time 14.3 11.4 - 15.2 seconds   INR 1.1 0.8 - 1.2    Comment: (NOTE) INR goal varies based on device and disease states. Performed at [facility], [place] [address], [place], [place] [address]    DG Chest Port 1 View  Result Date: 08/18/2022 CLINICAL DATA:  Cough. EXAM: PORTABLE CHEST 1 VIEW COMPARISON:  06/17/2022. FINDINGS: 1035 hours. Streaky opacity in the left lung base with patchy bibasilar opacities, may reflect some combination of atelectasis, aspiration or infection. Stable cardiac and mediastinal contours. No pleural effusion or pneumothorax. IMPRESSION: Patchy bibasilar opacities, may reflect some combination of atelectasis, aspiration or infection. Electronically Signed   By: Emmit Alexanders M.D.   On: 08/18/2022 10:52    Pending Labs Unresulted Labs (From admission, onward)     Start     Ordered   08/19/22 0500  CBC  Tomorrow morning,   R        08/18/22 1323   08/19/22 0500  Comprehensive metabolic panel  Tomorrow morning,   R        08/18/22 1323   08/19/22 0500  Magnesium  Tomorrow morning,   R        08/18/22 1323   08/19/22 0500  Phosphorus   Tomorrow morning,  R        08/18/22 1323   08/19/22 0500  C-reactive protein  Tomorrow morning,   R        08/18/22 1323   08/19/22 0500  Hemoglobin A1c  Tomorrow morning,   R        08/18/22 1323   08/19/22 0500  TSH  Tomorrow morning,   R        08/18/22 1325   08/19/22 0500  T4, free  Tomorrow morning,   R        08/18/22 1325            Vitals/Pain Today's Vitals   08/18/22 1225 08/18/22 1230 08/18/22 1245 08/18/22 1300  BP: (!) 175/101 (!) 197/78  (!) 146/130  Pulse: 99 (!) 102 (!) 101 73  Resp: 18 (!) 24 (!) 21 (!) 21  Temp:      TempSrc:      SpO2: 97% 98% 97% 95%    Isolation Precautions No active isolations  Medications Medications  diltiazem (CARDIZEM) 1 mg/mL load via infusion 10 mg (10 mg Intravenous Bolus from Bag 08/18/22 1045)    And  diltiazem (CARDIZEM) 125 mg in dextrose 5% 125 mL (1 mg/mL) infusion (15 mg/hr Intravenous Infusion Verify 08/18/22 1311)  apixaban (ELIQUIS) tablet 5 mg (5 mg Oral Given 08/18/22 1310)  cefTRIAXone (ROCEPHIN) 2 g in sodium chloride 0.9 % 100 mL IVPB (has no administration in time range)  furosemide (LASIX) injection 40 mg (has no administration in time range)  insulin aspart (novoLOG) injection 0-9 Units (has no administration in time range)  insulin aspart (novoLOG) injection 0-5 Units (has no administration in time range)  acetaminophen (TYLENOL) tablet 650 mg (has no administration in time range)  atorvastatin (LIPITOR) tablet 80 mg (has no administration in time range)  buPROPion (WELLBUTRIN SR) 12 hr tablet 150 mg (has no administration in time range)  hydrocerin (EUCERIN) cream (has no administration in time range)  finasteride (PROSCAR) tablet 5 mg (has no administration in time range)  gabapentin (NEURONTIN) capsule 300 mg (has no administration in time range)  multivitamin with minerals tablet 1 tablet (has no administration in time range)  nortriptyline (PAMELOR) capsule 25 mg (has no administration in time range)   LORazepam (ATIVAN) injection 0.5 mg (0.5 mg Intravenous Given 08/18/22 1306)    Mobility walks with device     Focused Assessments Cardiac Assessment Handoff:    Lab Results  Component Value Date   CKTOTAL 48 (L) 05/01/2022   Lab Results  Component Value Date   DDIMER 0.30 07/26/2017   Does the Patient currently have chest pain? No    R Recommendations: See Admitting Provider Note  Report given to:   Additional Notes:

## 2022-08-18 NOTE — Telephone Encounter (Signed)
Can you resend please, it was not confirmed received by pharmacy

## 2022-08-18 NOTE — Telephone Encounter (Signed)
Regarding: Thomas Gammon, DO // Send Rx to [facility]  ----- Message from [facility] sent at 08/18/2022  9:54 AM EST -----  Pt is calling about this and it is needing to be sent electronically    ----- Message from [facility] sent at 08/17/2022 11:49 AM EST -----  Thomas Gammon, DO    Pt called back in stating he needs the following Rx sent to [place] so he can use his insurance card to get the Rx.     --apixaban (ELIQUIS) 5 mg Oral Tablet, Take 1 Tablet (5 mg total) by mouth Twice daily SHIP TO PATIENT, [id number] STATE LICENSE- [id number] [place]- [id number]    [place], [place] [id number]    [address] [place] [address]    Phone: [phone] Fax: [phone]    Hours: Not open 24 hours     Thanks,  Autumn Whisper [employer]    ----- Message from Ladean Raya sent at 08/07/2022 12:22 PM EST -----  Thomas Gammon, DO  Gwen is needing shipping delivery preference   please fax to [phone]  for the medication below      Disp Refills Start End   apixaban (ELIQUIS) 5 mg Oral Tablet 180 Tablet 3 07/27/2022 -   Sig - Route: Take 1 Tablet (5 mg total) by mouth Twice daily SHIP TO PATIENT,  [id number]  STATE LICENSE- [id number] L

## 2022-08-19 ENCOUNTER — Encounter (HOSPITAL_COMMUNITY)
Admission: EM | Disposition: A | Discharge status: Home / Self Care | Payer: Self-pay | Source: Home / Self Care | Attending: Internal Medicine

## 2022-08-19 ENCOUNTER — Encounter (HOSPITAL_COMMUNITY): Payer: Self-pay | Admitting: Internal Medicine

## 2022-08-19 ENCOUNTER — Inpatient Hospital Stay (HOSPITAL_COMMUNITY): Payer: Medicare HMO | Admitting: Certified Registered Nurse Anesthetist

## 2022-08-19 ENCOUNTER — Inpatient Hospital Stay (HOSPITAL_COMMUNITY): Payer: Medicare HMO

## 2022-08-19 DIAGNOSIS — I480 Paroxysmal atrial fibrillation: Principal | ICD-10-CM

## 2022-08-19 DIAGNOSIS — I110 Hypertensive heart disease with heart failure: Secondary | ICD-10-CM

## 2022-08-19 DIAGNOSIS — E66812 Obesity, class 2: Secondary | ICD-10-CM | POA: Diagnosis present

## 2022-08-19 DIAGNOSIS — I5023 Acute on chronic systolic (congestive) heart failure: Secondary | ICD-10-CM

## 2022-08-19 DIAGNOSIS — N1831 Chronic kidney disease, stage 3a: Secondary | ICD-10-CM

## 2022-08-19 DIAGNOSIS — E785 Hyperlipidemia, unspecified: Secondary | ICD-10-CM | POA: Diagnosis present

## 2022-08-19 DIAGNOSIS — I10 Essential (primary) hypertension: Secondary | ICD-10-CM | POA: Diagnosis not present

## 2022-08-19 DIAGNOSIS — E1169 Type 2 diabetes mellitus with other specified complication: Secondary | ICD-10-CM

## 2022-08-19 DIAGNOSIS — I509 Heart failure, unspecified: Secondary | ICD-10-CM | POA: Diagnosis not present

## 2022-08-19 DIAGNOSIS — I4891 Unspecified atrial fibrillation: Secondary | ICD-10-CM

## 2022-08-19 DIAGNOSIS — Z87891 Personal history of nicotine dependence: Secondary | ICD-10-CM

## 2022-08-19 DIAGNOSIS — E669 Obesity, unspecified: Secondary | ICD-10-CM

## 2022-08-19 DIAGNOSIS — I5021 Acute systolic (congestive) heart failure: Secondary | ICD-10-CM | POA: Diagnosis not present

## 2022-08-19 HISTORY — PX: CARDIOVERSION: SHX1299

## 2022-08-19 LAB — GLUCOSE, CAPILLARY
Glucose-Capillary: 95 mg/dL (ref 70–99)
Glucose-Capillary: 94 mg/dL (ref 70–99)
Glucose-Capillary: 107 mg/dL — ABNORMAL HIGH (ref 70–99)
Glucose-Capillary: 98 mg/dL (ref 70–99)

## 2022-08-19 LAB — CBC
HCT: 42.4 % (ref 39.0–52.0)
Hemoglobin: 14 g/dL (ref 13.0–17.0)
MCH: 30.1 pg (ref 26.0–34.0)
MCHC: 33 g/dL (ref 30.0–36.0)
MCV: 91.2 fL (ref 80.0–100.0)
Platelets: 185 10*3/uL (ref 150–400)
RBC: 4.65 MIL/uL (ref 4.22–5.81)
RDW: 18.1 % — ABNORMAL HIGH (ref 11.5–15.5)
WBC: 9.9 10*3/uL (ref 4.0–10.5)
nRBC: 0 % (ref 0.0–0.2)

## 2022-08-19 LAB — COMPREHENSIVE METABOLIC PANEL
ALT: 13 U/L (ref 0–44)
AST: 18 U/L (ref 15–41)
Albumin: 3 g/dL — ABNORMAL LOW (ref 3.5–5.0)
Alkaline Phosphatase: 53 U/L (ref 38–126)
Anion gap: 14 (ref 5–15)
BUN: 20 mg/dL (ref 8–23)
CO2: 24 mmol/L (ref 22–32)
Calcium: 8.9 mg/dL (ref 8.9–10.3)
Chloride: 99 mmol/L (ref 98–111)
Creatinine, Ser: 1.27 mg/dL — ABNORMAL HIGH (ref 0.61–1.24)
GFR, Estimated: 58 mL/min — ABNORMAL LOW (ref >60)
Glucose, Bld: 104 mg/dL — ABNORMAL HIGH (ref 70–99)
Potassium: 3.5 mmol/L (ref 3.5–5.1)
Sodium: 137 mmol/L (ref 135–145)
Total Bilirubin: 1.2 mg/dL (ref 0.3–1.2)
Total Protein: 6 g/dL — ABNORMAL LOW (ref 6.5–8.1)

## 2022-08-19 LAB — ECHOCARDIOGRAM COMPLETE
AR max vel: 2.85 cm2
AV Area VTI: 2.43 cm2
AV Area mean vel: 2.56 cm2
AV Mean grad: 2.5 mmHg
AV Peak grad: 4 mmHg
Ao pk vel: 1 m/s
Area-P 1/2: 6.54 cm2
Calc EF: 59.1 %
Height: 64 in
P 1/2 time: 627 msec
S' Lateral: 3.2 cm
Single Plane A2C EF: 54.3 %
Single Plane A4C EF: 60.7 %
Weight: 3612.02 oz

## 2022-08-19 LAB — TSH: TSH: 1.915 u[IU]/mL (ref 0.350–4.500)

## 2022-08-19 LAB — BRAIN NATRIURETIC PEPTIDE: B Natriuretic Peptide: 200.4 pg/mL — ABNORMAL HIGH (ref 0.0–100.0)

## 2022-08-19 LAB — T4, FREE: Free T4: 1.34 ng/dL — ABNORMAL HIGH (ref 0.61–1.12)

## 2022-08-19 LAB — MAGNESIUM: Magnesium: 1.9 mg/dL (ref 1.7–2.4)

## 2022-08-19 LAB — PHOSPHORUS: Phosphorus: 3.5 mg/dL (ref 2.5–4.6)

## 2022-08-19 LAB — C-REACTIVE PROTEIN: CRP: 7 mg/dL — ABNORMAL HIGH (ref <1.0)

## 2022-08-19 SURGERY — CARDIOVERSION
Anesthesia: General

## 2022-08-19 MED ORDER — MAGNESIUM SULFATE 2 GM/50ML IV SOLN
2.0000 g | Freq: Once | INTRAVENOUS | Status: AC
  Administered 2022-08-19: 2 g via INTRAVENOUS
  Filled 2022-08-19: qty 50

## 2022-08-19 MED ORDER — AMIODARONE HCL 200 MG PO TABS
400.0000 mg | ORAL_TABLET | Freq: Two times a day (BID) | ORAL | Status: DC
  Administered 2022-08-19 – 2022-08-20 (×3): 400 mg via ORAL
  Filled 2022-08-19 (×3): qty 2

## 2022-08-19 MED ORDER — AMIODARONE HCL 200 MG PO TABS: 200.0000 mg | ORAL_TABLET | Freq: Every day | ORAL | Status: DC

## 2022-08-19 MED ORDER — LIDOCAINE 2% (20 MG/ML) 5 ML SYRINGE
INTRAMUSCULAR | Status: DC | PRN
  Administered 2022-08-19: 60 mg via INTRAVENOUS

## 2022-08-19 MED ORDER — PERFLUTREN LIPID MICROSPHERE
1.0000 mL | INTRAVENOUS | Status: AC | PRN
  Administered 2022-08-19: 2 mL via INTRAVENOUS

## 2022-08-19 MED ORDER — SODIUM CHLORIDE 0.9 % IV SOLN: INTRAVENOUS | Status: DC | PRN

## 2022-08-19 MED ORDER — POTASSIUM CHLORIDE CRYS ER 20 MEQ PO TBCR
40.0000 meq | EXTENDED_RELEASE_TABLET | Freq: Once | ORAL | Status: AC
  Administered 2022-08-19: 40 meq via ORAL
  Filled 2022-08-19: qty 2

## 2022-08-19 MED ORDER — EMPAGLIFLOZIN 10 MG PO TABS
10.0000 mg | ORAL_TABLET | Freq: Every day | ORAL | Status: DC
  Administered 2022-08-19 – 2022-08-20 (×2): 10 mg via ORAL
  Filled 2022-08-19 (×2): qty 1

## 2022-08-19 MED ORDER — AMIODARONE HCL 200 MG PO TABS: 400.0000 mg | ORAL_TABLET | Freq: Two times a day (BID) | ORAL | Status: DC

## 2022-08-19 MED ORDER — PROPOFOL 10 MG/ML IV BOLUS
INTRAVENOUS | Status: DC | PRN
  Administered 2022-08-19: 60 mg via INTRAVENOUS

## 2022-08-19 MED ORDER — CARVEDILOL 6.25 MG PO TABS
6.2500 mg | ORAL_TABLET | Freq: Two times a day (BID) | ORAL | Status: DC
  Administered 2022-08-19 – 2022-08-20 (×3): 6.25 mg via ORAL
  Filled 2022-08-19 (×3): qty 1

## 2022-08-19 NOTE — Interval H&P Note (Signed)
History and Physical Interval Note:  08/19/2022 11:52 AM  Tanner [name]  has presented today for surgery, with the diagnosis of afib.  The various methods of treatment have been discussed with the patient and family. After consideration of risks, benefits and other options for treatment, the patient has consented to  Procedure(s): CARDIOVERSION (N/A) as a surgical intervention.  [name] history has been reviewed, patient examined, no change in status, stable for surgery.  I have reviewed the patient's chart and labs.  Questions were answered to the patient's satisfaction.     Freada Bergeron

## 2022-08-19 NOTE — Progress Notes (Signed)
Rounding Note    Patient Name: Tanner [name] Date of Encounter: 08/19/2022  [place] Cardiologist: None   Subjective   He feels well this AM. No SOB. On room air  Inpatient Medications    Scheduled Meds:  apixaban  5 mg Oral BID   atorvastatin  80 mg Oral Daily   buPROPion ER  150 mg Oral BID   finasteride  5 mg Oral QHS   furosemide  40 mg Intravenous BID   gabapentin  300 mg Oral TID   hydrocerin   Topical BID   insulin aspart  0-5 Units Subcutaneous QHS   insulin aspart  0-9 Units Subcutaneous TID WC   multivitamin with minerals  1 tablet Oral Daily   nortriptyline  25 mg Oral QHS   nystatin   Topical BID   Continuous Infusions:  amiodarone 30 mg/hr (08/19/22 0621)   cefTRIAXone (ROCEPHIN)  IV Stopped (08/18/22 1713)   PRN Meds: acetaminophen   Vital Signs    Vitals:   08/18/22 2000 08/18/22 2136 08/19/22 0116 08/19/22 0448  BP:  (!) 150/75 (!) 155/74 (!) 156/60  Pulse:  (!) 108 77 83  Resp: '18 18 18 18  '$ Temp:  97.7 F (36.5 C) 97.7 F (36.5 C) 98 F (36.7 C)  TempSrc:   Oral Oral  SpO2:   91% 90%  Weight:   102.4 kg   Height:        Intake/Output Summary (Last 24 hours) at 08/19/2022 0904 Last data filed at 08/19/2022 [id number] Gross per 24 hour  Intake 507.63 ml  Output 1650 ml  Net -1142.37 ml      08/19/2022    1:16 AM 08/18/2022    3:00 PM 07/17/2022    3:06 PM  Last 3 Weights  Weight (lbs) 225 lb 12 oz 223 lb 9.6 oz 239 lb  Weight (kg) 102.4 kg 101.424 kg 108.41 kg      Telemetry    Afib, does appear to have some P waves in and out with c/f sinus tach/PAT; pending 12 lead - Personally Reviewed  ECG    NA - Personally Reviewed  Physical Exam   Vitals:   08/19/22 0116 08/19/22 0448  BP: (!) 155/74 (!) 156/60  Pulse: 77 83  Resp: 18 18  Temp: 97.7 F (36.5 C) 98 F (36.7 C)  SpO2: 91% 90%    GEN: No acute distress.   Cardiac: IRRR, no murmurs, rubs, or gallops.  Respiratory: Clear to auscultation bilaterally. GI:  Soft, nontender, non-distended  MS: erythema of the R leg Neuro:  Nonfocal  Psych: Normal affect   Labs    High Sensitivity Troponin:  No results for input(s): "TROPONINIHS" in the last 720 hours.   Chemistry Recent Labs  Lab 08/18/22 1033 08/19/22 0104  NA 138 137  K 4.3 3.5  CL 102 99  CO2 23 24  GLUCOSE 128* 104*  BUN 23 20  CREATININE 1.45* 1.27*  CALCIUM 9.3 8.9  MG  --  1.9  PROT 6.8 6.0*  ALBUMIN 3.4* 3.0*  AST 25 18  ALT 16 13  ALKPHOS 62 53  BILITOT 1.8* 1.2  GFRNONAA 50* 58*  ANIONGAP 13 14    Lipids No results for input(s): "CHOL", "TRIG", "HDL", "LABVLDL", "LDLCALC", "CHOLHDL" in the last 168 hours.  Hematology Recent Labs  Lab 08/18/22 1033 08/19/22 0104  WBC 8.3 9.9  RBC 5.01 4.65  HGB 15.0 14.0  HCT 48.5 42.4  MCV 96.8 91.2  MCH 29.9 30.1  MCHC 30.9 33.0  RDW 18.3* 18.1*  PLT 160 185   Thyroid  Recent Labs  Lab 08/19/22 0104  TSH 1.915  FREET4 1.34*    BNPNo results for input(s): "BNP", "PROBNP" in the last 168 hours.  DDimer No results for input(s): "DDIMER" in the last 168 hours.   Radiology    DG Chest Port 1 View  Result Date: 08/18/2022 CLINICAL DATA:  Cough. EXAM: PORTABLE CHEST 1 VIEW COMPARISON:  06/17/2022. FINDINGS: 1035 hours. Streaky opacity in the left lung base with patchy bibasilar opacities, may reflect some combination of atelectasis, aspiration or infection. Stable cardiac and mediastinal contours. No pleural effusion or pneumothorax. IMPRESSION: Patchy bibasilar opacities, may reflect some combination of atelectasis, aspiration or infection. Electronically Signed   By: Emmit Alexanders M.D.   On: 08/18/2022 10:52    Cardiac Studies   TTE 02/19/2022 1. Left ventricular ejection fraction, by estimation, is 45 to 50%. The  left ventricle has mildly decreased function. The left ventricle  demonstrates global hypokinesis. The left ventricular internal cavity size  was mildly dilated. Left ventricular  diastolic parameters  are consistent with Grade I diastolic dysfunction  (impaired relaxation).   2. Right ventricular systolic function is normal. The right ventricular  size is normal. Tricuspid regurgitation signal is inadequate for assessing  PA pressure.   3. Left atrial size was mildly dilated.   4. The mitral valve is normal in structure. Moderate mitral valve  regurgitation.   5. The aortic valve is tricuspid. Aortic valve regurgitation is trivial.  No aortic stenosis is present.   6. The inferior vena cava is normal in size with greater than 50%  respiratory variability, suggesting right atrial pressure of 3 mmHg.   Patient Profile     Tanner [name] is a 77 y.o. male with a hx of mitral regurgitation, aortic regurgitation, chronic systolic heart failure, DM type II, DVT and PE (on eliquis), hypertension,  who is being seen 08/18/2022 for the evaluation of afib with RVR at the request of Dr. Jeanell Sparrow.   Assessment & Plan    Paroxysmal Afib: here with RVR -seems to be going in and out of it. See distinctive P waves, can be PAT. Will get a 12 lead - if going back into afib, plan for DCCV this afternoon at 12:30 PM - pending repeat TTE - will transition IV amiodarone to oral today; 400 mg BID for 10 days, then 200 mg amiodarone daily thereafter - change home atenolol to coreg 6.25 mg BID [ for better BP control, atenolol not great with fluctuating renal fxn] - continue eliquis 5 mg BID  Chronic HFmrEF Moderate MR - not too volume overloaded - diuresing with IV lasix , can transition to PO diuretics tomorrow - getting BNP - crt stable - start jardiance 10 mg daily [if cost prohibitive, can dc considering age]  HLD -continue lipitor 80 mg daily  R leg erythema: chronic since [place], from [place] for DC tomorrow if converted. Will do one more day of IV diuresis, than back to oral. Will continue with oral amiodarone.  For questions or updates, please contact [place] Please consult [email or web address] for contact info under     Time Spent Directly with Patient:  I have spent a total of 35 minutes with the patient reviewing hospital notes, telemetry, EKGs, labs and examining the patient as well as establishing an assessment and plan that was discussed personally with the  patient.  > 50% of time was spent in direct patient care.      Signed, Janina Mayo, MD  08/19/2022, 9:04 AM

## 2022-08-19 NOTE — Assessment & Plan Note (Addendum)
Echocardiogram with mild reduction in LV systolic function 45 to A999333, with global hypokinesis. RV systolic function preserved, moderate mitral regurgitation.  Urine output is 123XX123 ml Systolic blood pressure is 156 to 130 mmHg.   Plan to continue carvedilol and empagliflozin. Needs better rate and rhythm control atrial fibrillation. Diuresis with furosemide 40 mg IV q12 hrs.   Lower extremity erythema, possible related to vascular insufficiency. Hold on antibiotics for now.  Cellulitis ruled out.

## 2022-08-19 NOTE — Assessment & Plan Note (Addendum)
CKD stage 3a  Renal functio with serum cr at 1.27. K is 3,5 and serum bicarbonate at 24.  Mg 1.9  Add 40 meq Kcl and 2 g mag sulfate, keep K at 4 and Mg at 2.  Continue diuresis with furosemide and follow up renal function in am.

## 2022-08-19 NOTE — Consult Note (Signed)
   [facility] Inpatient Consult   08/19/2022  Tanner [name] October 08, 1945 [id number]     Orientation with Natividad Brood, [facility] Liaison for review.   Location: [facility] Liaison met with pt and son via bedside  [facility]).   [facility]) [place] Patient: Insurance [facility])    Primary Care Provider:  Hoyt Koch, MD At [facility] at [facility]  Patient screened for less than 30 days readmission hospitalization with noted medium risk score for 4 ED visits and 1 IP in 6 months on unplanned readmission to assess for potential [facility]) Care Management service needs for post hospital transition for care coordination. Pt continues to drive with assistance from son when needed .  Pt also receives assistance from his neighbor.  Pt states he is active and visits his provider regularly. SDOH assessed with no needs presented at this time. Pt was given an appointment reminder card and 24 hours Nurse Advise Line magnet.      Plan:  HIPAA verified and [facility] RN will continue to follow ongoing discharge disposition to assess for post hospital community care coordination/management needs.  Referral request for community care coordination upon discharge: ED visits/HF and new onset of Atrial Fibrillation (pending cardioversion) as indicated in progressive round.    [place] does not replace or interfere with any arrangements made by the Inpatient Transition of Care team.   For questions contact:   Raina Mina, RN, [place] Hours M-F 8:00 am to 4:30 pm [phone] [Office toll free line][facility] Office Hours are M-F 8:30 - 5 pm 24 hour nurse advise line [phone] Conceirge  [email or web address]

## 2022-08-19 NOTE — Assessment & Plan Note (Signed)
His glucose remained stable during his hospitalization.  Continue semaglutide as outpatient.  Continue with statin therapy.

## 2022-08-19 NOTE — Assessment & Plan Note (Signed)
Continue anticoagulation with apixaban.  ?

## 2022-08-19 NOTE — H&P (View-Only) (Signed)
Rounding Note    Patient Name: Tanner [name] Date of Encounter: 08/19/2022  [place] Cardiologist: None   Subjective   He feels well this AM. No SOB. On room air  Inpatient Medications    Scheduled Meds:  apixaban  5 mg Oral BID   atorvastatin  80 mg Oral Daily   buPROPion ER  150 mg Oral BID   finasteride  5 mg Oral QHS   furosemide  40 mg Intravenous BID   gabapentin  300 mg Oral TID   hydrocerin   Topical BID   insulin aspart  0-5 Units Subcutaneous QHS   insulin aspart  0-9 Units Subcutaneous TID WC   multivitamin with minerals  1 tablet Oral Daily   nortriptyline  25 mg Oral QHS   nystatin   Topical BID   Continuous Infusions:  amiodarone 30 mg/hr (08/19/22 0621)   cefTRIAXone (ROCEPHIN)  IV Stopped (08/18/22 1713)   PRN Meds: acetaminophen   Vital Signs    Vitals:   08/18/22 2000 08/18/22 2136 08/19/22 0116 08/19/22 0448  BP:  (!) 150/75 (!) 155/74 (!) 156/60  Pulse:  (!) 108 77 83  Resp: '18 18 18 18  '$ Temp:  97.7 F (36.5 C) 97.7 F (36.5 C) 98 F (36.7 C)  TempSrc:   Oral Oral  SpO2:   91% 90%  Weight:   102.4 kg   Height:        Intake/Output Summary (Last 24 hours) at 08/19/2022 0904 Last data filed at 08/19/2022 [id number] Gross per 24 hour  Intake 507.63 ml  Output 1650 ml  Net -1142.37 ml      08/19/2022    1:16 AM 08/18/2022    3:00 PM 07/17/2022    3:06 PM  Last 3 Weights  Weight (lbs) 225 lb 12 oz 223 lb 9.6 oz 239 lb  Weight (kg) 102.4 kg 101.424 kg 108.41 kg      Telemetry    Afib, does appear to have some P waves in and out with c/f sinus tach/PAT; pending 12 lead - Personally Reviewed  ECG    NA - Personally Reviewed  Physical Exam   Vitals:   08/19/22 0116 08/19/22 0448  BP: (!) 155/74 (!) 156/60  Pulse: 77 83  Resp: 18 18  Temp: 97.7 F (36.5 C) 98 F (36.7 C)  SpO2: 91% 90%    GEN: No acute distress.   Cardiac: IRRR, no murmurs, rubs, or gallops.  Respiratory: Clear to auscultation bilaterally. GI:  Soft, nontender, non-distended  MS: erythema of the R leg Neuro:  Nonfocal  Psych: Normal affect   Labs    High Sensitivity Troponin:  No results for input(s): "TROPONINIHS" in the last 720 hours.   Chemistry Recent Labs  Lab 08/18/22 1033 08/19/22 0104  NA 138 137  K 4.3 3.5  CL 102 99  CO2 23 24  GLUCOSE 128* 104*  BUN 23 20  CREATININE 1.45* 1.27*  CALCIUM 9.3 8.9  MG  --  1.9  PROT 6.8 6.0*  ALBUMIN 3.4* 3.0*  AST 25 18  ALT 16 13  ALKPHOS 62 53  BILITOT 1.8* 1.2  GFRNONAA 50* 58*  ANIONGAP 13 14    Lipids No results for input(s): "CHOL", "TRIG", "HDL", "LABVLDL", "LDLCALC", "CHOLHDL" in the last 168 hours.  Hematology Recent Labs  Lab 08/18/22 1033 08/19/22 0104  WBC 8.3 9.9  RBC 5.01 4.65  HGB 15.0 14.0  HCT 48.5 42.4  MCV 96.8 91.2  MCH 29.9 30.1  MCHC 30.9 33.0  RDW 18.3* 18.1*  PLT 160 185   Thyroid  Recent Labs  Lab 08/19/22 0104  TSH 1.915  FREET4 1.34*    BNPNo results for input(s): "BNP", "PROBNP" in the last 168 hours.  DDimer No results for input(s): "DDIMER" in the last 168 hours.   Radiology    DG Chest Port 1 View  Result Date: 08/18/2022 CLINICAL DATA:  Cough. EXAM: PORTABLE CHEST 1 VIEW COMPARISON:  06/17/2022. FINDINGS: 1035 hours. Streaky opacity in the left lung base with patchy bibasilar opacities, may reflect some combination of atelectasis, aspiration or infection. Stable cardiac and mediastinal contours. No pleural effusion or pneumothorax. IMPRESSION: Patchy bibasilar opacities, may reflect some combination of atelectasis, aspiration or infection. Electronically Signed   By: Emmit Alexanders M.D.   On: 08/18/2022 10:52    Cardiac Studies   TTE 02/19/2022 1. Left ventricular ejection fraction, by estimation, is 45 to 50%. The  left ventricle has mildly decreased function. The left ventricle  demonstrates global hypokinesis. The left ventricular internal cavity size  was mildly dilated. Left ventricular  diastolic parameters  are consistent with Grade I diastolic dysfunction  (impaired relaxation).   2. Right ventricular systolic function is normal. The right ventricular  size is normal. Tricuspid regurgitation signal is inadequate for assessing  PA pressure.   3. Left atrial size was mildly dilated.   4. The mitral valve is normal in structure. Moderate mitral valve  regurgitation.   5. The aortic valve is tricuspid. Aortic valve regurgitation is trivial.  No aortic stenosis is present.   6. The inferior vena cava is normal in size with greater than 50%  respiratory variability, suggesting right atrial pressure of 3 mmHg.   Patient Profile     Tanner [name] is a 77 y.o. male with a hx of mitral regurgitation, aortic regurgitation, chronic systolic heart failure, DM type II, DVT and PE (on eliquis), hypertension,  who is being seen 08/18/2022 for the evaluation of afib with RVR at the request of Dr. Jeanell Sparrow.   Assessment & Plan    Paroxysmal Afib: here with RVR -seems to be going in and out of it. See distinctive P waves, can be PAT. Will get a 12 lead - if going back into afib, plan for DCCV this afternoon at 12:30 PM - pending repeat TTE - will transition IV amiodarone to oral today; 400 mg BID for 10 days, then 200 mg amiodarone daily thereafter - change home atenolol to coreg 6.25 mg BID [ for better BP control, atenolol not great with fluctuating renal fxn] - continue eliquis 5 mg BID  Chronic HFmrEF Moderate MR - not too volume overloaded - diuresing with IV lasix , can transition to PO diuretics tomorrow - getting BNP - crt stable - start jardiance 10 mg daily [if cost prohibitive, can dc considering age]  HLD -continue lipitor 80 mg daily  R leg erythema: chronic since [place], from [place] for DC tomorrow if converted. Will do one more day of IV diuresis, than back to oral. Will continue with oral amiodarone.  For questions or updates, please contact [place] Please consult [email or web address] for contact info under     Time Spent Directly with Patient:  I have spent a total of 35 minutes with the patient reviewing hospital notes, telemetry, EKGs, labs and examining the patient as well as establishing an assessment and plan that was discussed personally with the  patient.  > 50% of time was spent in direct patient care.      Signed, Janina Mayo, MD  08/19/2022, 9:04 AM

## 2022-08-19 NOTE — Progress Notes (Signed)
Progress Note   Patient: Tanner [name] [id number] DOB: [birth year] DOA: 08/18/2022     1 DOS: the patient was seen and examined on 08/19/2022   Brief hospital course: [name] was admitted to the hospital with the working diagnosis of heart failure exacerbation.   77 yo male with the past medical history of heart failure, T2DM, hypertension and dyslipidemia who presented with dyspnea. Positive worsening dyspnea, lower extremity edema and orthopnea. EMS was called and he was found in atrial fibrillation with RVR. In the ED he was placed on IV diltiazem for rate control, hid blood pressure was 175/101, HR 102, RR 24, 02 saturation 97%, heart with S1 and S2 present irregularly irregular with no gallops, rubs or murmurs, lungs with decreased breath sounds at bases, abdomen with no distention and bilateral lower extremity edema.   Na 138, K 4,3 Cl 102. Bicarbonate 23, glucose 128, bun 23 cr 1,45  Sars covid 19 negative   Chest radiograph with mild cardiomegaly and bilateral atelectasis at bases.   EKG 125 bpm, left axis with right bundle branch block, qtc 518, atrial flutter with variable block, positive PVC, no significant ST segment or T wave changes.   Patient was transitioned to amiodarone.  Plan for cardioversion this pm.       Assessment and Plan: * Acute on chronic systolic CHF (congestive heart failure) (HCC) Echocardiogram with mild reduction in LV systolic function 45 to A999333, with global hypokinesis. RV systolic function preserved, moderate mitral regurgitation.  Urine output is 123XX123 ml Systolic blood pressure is 156 to 130 mmHg.   Plan to continue carvedilol and empagliflozin. Needs better rate and rhythm control atrial fibrillation. Diuresis with furosemide 40 mg IV q12 hrs.   Lower extremity erythema, possible related to vascular insufficiency. Hold on antibiotics for now.  Cellulitis ruled out.   Atrial fibrillation with RVR (HCC) Continue RVR 110's bpm. Plan to  continue amiodarone load and TEE/ direct current cardioversion today. Continue anticoagulation with amiodarone.    Essential hypertension Continue blood pressure control with carvedilol.   Chronic kidney disease (CKD), stage III (moderate) (HCC) CKD stage 3a  Renal functio with serum cr at 1.27. K is 3,5 and serum bicarbonate at 24.  Mg 1.9  Add 40 meq Kcl and 2 g mag sulfate, keep K at 4 and Mg at 2.  Continue diuresis with furosemide and follow up renal function in am.   History of pulmonary embolus (PE) Continue anticoagulation with apixaban.   Type 2 diabetes mellitus with hyperlipidemia (HCC) Fasting glucose this am is 104. Continue insulin sliding scale for glucose cover and monitoring.   Class 2 obesity Calculated BMI is 38.7   Depression Continue with nortriptyline and alprazolam.         Subjective: Patient is feeling better, continue to have rapid heart rate, edema lower extremities have improved.   Physical Exam: Vitals:   08/18/22 2136 08/19/22 0116 08/19/22 0448 08/19/22 1025  BP: (!) 150/75 (!) 155/74 (!) 156/60 130/79  Pulse: (!) 108 77 83 99  Resp: '18 18 18 18  '$ Temp: 97.7 F (36.5 C) 97.7 F (36.5 C) 98 F (36.7 C) 98.3 F (36.8 C)  TempSrc:  Oral Oral Oral  SpO2:  91% 90% 93%  Weight:  102.4 kg    Height:       Neurology awake and alert ENT with mild pallor Cardiovascular with S1 and S2 present, irregularly irregular, tachycardic with no gallops, rubs or murmurs Respiratory with mild rales at  bases with no wheezing Abdomen with no distention Positive lower extremity edema, + with faint erythema bilateral, anterior legs.  Data Reviewed:    Family Communication: I spoke with patient's son at the bedside, we talked in detail about patient's condition, plan of care and prognosis and all questions were addressed.   Disposition: Status is: Inpatient Remains inpatient appropriate because: atrial fibrillation with RVR   Planned Discharge  Destination: Home  Author: Tawni Millers, MD 08/19/2022 11:26 AM  For on call review [email or web address].

## 2022-08-19 NOTE — Assessment & Plan Note (Signed)
Calculated BMI is 38.7

## 2022-08-19 NOTE — Assessment & Plan Note (Signed)
Paroxysmal atrial fibrillation.  Patient was placed on IV amiodarone load and underwent direct current cardioversion on 08/19/22, with successful restoration of sinus rhythm. Plan to continue with oral amiodarone load and continue anticoagulation with apixaban.

## 2022-08-19 NOTE — Anesthesia Postprocedure Evaluation (Signed)
Anesthesia Post Note  Patient: Tanner [name]  Procedure(s) Performed: CARDIOVERSION     Patient location during evaluation: PACU Anesthesia Type: General Level of consciousness: awake and alert Pain management: pain level controlled Vital Signs Assessment: post-procedure vital signs reviewed and stable Respiratory status: spontaneous breathing, nonlabored ventilation, respiratory function stable and patient connected to nasal cannula oxygen Cardiovascular status: blood pressure returned to baseline and stable Postop Assessment: no apparent nausea or vomiting Anesthetic complications: no  No notable events documented.  Last Vitals:  Vitals:   08/19/22 1220 08/19/22 1228  BP: 120/61 124/74  Pulse: 82 83  Resp: (!) 24 (!) 21  Temp:    SpO2: 94% 92%    Last Pain:  Vitals:   08/19/22 1210  TempSrc:   PainSc: 0-No pain                 Effie Berkshire

## 2022-08-19 NOTE — Anesthesia Procedure Notes (Signed)
Procedure Name: General with mask airway Date/Time: 08/19/2022 11:58 AM  Performed by: Carolan Clines, CRNAPre-anesthesia Checklist: Patient identified, Emergency Drugs available, Suction available and Patient being monitored Patient Re-evaluated:Patient Re-evaluated prior to induction Oxygen Delivery Method: Ambu bag Preoxygenation: Pre-oxygenation with 100% oxygen Induction Type: IV induction Dental Injury: Teeth and Oropharynx as per pre-operative assessment

## 2022-08-19 NOTE — Assessment & Plan Note (Signed)
Continue blood pressure control with carvedilol.  

## 2022-08-19 NOTE — Progress Notes (Signed)
  Echocardiogram 2D Echocardiogram has been performed.  Tanner [name] 08/19/2022, 10:08 AM

## 2022-08-19 NOTE — Transfer of Care (Signed)
Immediate Anesthesia Transfer of Care Note  Patient: Tanner [name]  Procedure(s) Performed: CARDIOVERSION  Patient Location: Endoscopy Unit  Anesthesia Type:General  Level of Consciousness: drowsy  Airway & Oxygen Therapy: Patient Spontanous Breathing  Post-op Assessment: Report given to RN and Post -op Vital signs reviewed and stable  Post vital signs: Reviewed and stable  Last Vitals:  Vitals Value Taken Time  BP 106/65   Temp    Pulse 84   Resp 21   SpO2 98     Last Pain:  Vitals:   08/19/22 1130  TempSrc: Temporal  PainSc: 0-No pain         Complications: No notable events documented.

## 2022-08-19 NOTE — CV Procedure (Signed)
Procedure: Electrical Cardioversion Indications:  Atrial Fibrillation  Procedure Details:  Consent: Risks of procedure as well as the alternatives and risks of each were explained to the (patient/caregiver).  Consent for procedure obtained.  Time Out: Verified patient identification, verified procedure, site/side was marked, verified correct patient position, special equipment/implants available, medications/allergies/relevent history reviewed, required imaging and test results available. PERFORMED.  Patient placed on cardiac monitor, pulse oximetry, supplemental oxygen as necessary.  Sedation given:  Propofol '60mg'$ ; lidocaine '60mg'$  Pacer pads placed anterior and posterior chest.  Cardioverted 1 time(s).  Cardioversion with synchronized biphasic 200J shock.  Evaluation: Findings: Post procedure EKG shows: NSR Complications: None Patient did tolerate procedure well.  Time Spent Directly with the Patient:  8mnutes   Tanner Bergeron3/11/2022, 12:03 PM

## 2022-08-19 NOTE — Assessment & Plan Note (Signed)
Continue with nortriptyline and alprazolam.

## 2022-08-19 NOTE — Hospital Course (Addendum)
Mr. Swarr was admitted to the hospital with the working diagnosis of heart failure exacerbation.   77 yo male with the past medical history of heart failure, T2DM, hypertension and dyslipidemia who presented with dyspnea. Positive worsening dyspnea, lower extremity edema and orthopnea. EMS was called and he was found in atrial fibrillation with RVR. In the ED he was placed on IV diltiazem for rate control, hid blood pressure was 175/101, HR 102, RR 24, 02 saturation 97%, heart with S1 and S2 present irregularly irregular with no gallops, rubs or murmurs, lungs with decreased breath sounds at bases, abdomen with no distention and bilateral lower extremity edema.   Na 138, K 4,3 Cl 102. Bicarbonate 23, glucose 128, bun 23 cr 1,45  Sars covid 19 negative   Chest radiograph with mild cardiomegaly and bilateral atelectasis at bases.   EKG 125 bpm, left axis with right bundle branch block, qtc 518, atrial flutter with variable block, positive PVC, no significant ST segment or T wave changes.   Patient was transitioned to amiodarone.  Plan for cardioversion this pm.

## 2022-08-19 NOTE — Anesthesia Preprocedure Evaluation (Addendum)
Anesthesia Evaluation  Patient identified by MRN, date of birth, ID band Patient awake    Reviewed: Allergy & Precautions, NPO status , Patient's Chart, lab work & pertinent test results  Airway Mallampati: II  TM Distance: >3 FB Neck ROM: Full    Dental  (+) Edentulous Lower, Edentulous Upper   Pulmonary sleep apnea , former smoker   breath sounds clear to auscultation       Cardiovascular hypertension, Pt. on home beta blockers +CHF  + dysrhythmias  Rhythm:Irregular Rate:Abnormal  Echo: 1. Left ventricular ejection fraction, by estimation, is 45 to 50%. The  left ventricle has mildly decreased function. The left ventricle  demonstrates global hypokinesis. The left ventricular internal cavity size  was mildly dilated. Left ventricular  diastolic parameters are consistent with Grade I diastolic dysfunction  (impaired relaxation).   2. Right ventricular systolic function is normal. The right ventricular  size is normal. Tricuspid regurgitation signal is inadequate for assessing  PA pressure.   3. Left atrial size was mildly dilated.   4. The mitral valve is normal in structure. Moderate mitral valve  regurgitation.   5. The aortic valve is tricuspid. Aortic valve regurgitation is trivial.  No aortic stenosis is present.   6. The inferior vena cava is normal in size with greater than 50%  respiratory variability, suggesting right atrial pressure of 3 mmHg.     Neuro/Psych  PSYCHIATRIC DISORDERS Anxiety Depression       GI/Hepatic negative GI ROS, Neg liver ROS,,,  Endo/Other  diabetes, Type 2, Oral Hypoglycemic Agents    Renal/GU Renal InsufficiencyRenal disease     Musculoskeletal  (+) Arthritis ,    Abdominal   Peds  Hematology   Anesthesia Other Findings   Reproductive/Obstetrics                             Anesthesia Physical Anesthesia Plan  ASA: 3  Anesthesia Plan: General    Post-op Pain Management: Minimal or no pain anticipated   Induction: Intravenous  PONV Risk Score and Plan: 0 and Treatment may vary due to age or medical condition  Airway Management Planned: Mask and Natural Airway  Additional Equipment: None  Intra-op Plan:   Post-operative Plan:   Informed Consent: I have reviewed the patients History and Physical, chart, labs and discussed the procedure including the risks, benefits and alternatives for the proposed anesthesia with the patient or authorized representative who has indicated his/her understanding and acceptance.       Plan Discussed with: CRNA  Anesthesia Plan Comments:        Anesthesia Quick Evaluation

## 2022-08-20 ENCOUNTER — Other Ambulatory Visit (HOSPITAL_COMMUNITY): Payer: Self-pay

## 2022-08-20 DIAGNOSIS — I480 Paroxysmal atrial fibrillation: Secondary | ICD-10-CM | POA: Diagnosis not present

## 2022-08-20 DIAGNOSIS — I5023 Acute on chronic systolic (congestive) heart failure: Secondary | ICD-10-CM | POA: Diagnosis not present

## 2022-08-20 DIAGNOSIS — I5021 Acute systolic (congestive) heart failure: Secondary | ICD-10-CM | POA: Diagnosis not present

## 2022-08-20 DIAGNOSIS — I4891 Unspecified atrial fibrillation: Secondary | ICD-10-CM | POA: Diagnosis not present

## 2022-08-20 DIAGNOSIS — I10 Essential (primary) hypertension: Secondary | ICD-10-CM | POA: Diagnosis not present

## 2022-08-20 LAB — CBC WITH DIFFERENTIAL/PLATELET
Abs Immature Granulocytes: 0.07 10*3/uL (ref 0.00–0.07)
Basophils Absolute: 0.1 10*3/uL (ref 0.0–0.1)
Basophils Relative: 1 %
Eosinophils Absolute: 0.3 10*3/uL (ref 0.0–0.5)
Eosinophils Relative: 4 %
HCT: 42.4 % (ref 39.0–52.0)
Hemoglobin: 13.9 g/dL (ref 13.0–17.0)
Immature Granulocytes: 1 %
Lymphocytes Relative: 24 %
Lymphs Abs: 2.2 10*3/uL (ref 0.7–4.0)
MCH: 30 pg (ref 26.0–34.0)
MCHC: 32.8 g/dL (ref 30.0–36.0)
MCV: 91.6 fL (ref 80.0–100.0)
Monocytes Absolute: 1 10*3/uL (ref 0.1–1.0)
Monocytes Relative: 11 %
Neutro Abs: 5.3 10*3/uL (ref 1.7–7.7)
Neutrophils Relative %: 59 %
Platelets: 206 10*3/uL (ref 150–400)
RBC: 4.63 MIL/uL (ref 4.22–5.81)
RDW: 18.2 % — ABNORMAL HIGH (ref 11.5–15.5)
WBC: 8.9 10*3/uL (ref 4.0–10.5)
nRBC: 0 % (ref 0.0–0.2)

## 2022-08-20 LAB — BASIC METABOLIC PANEL
Anion gap: 10 (ref 5–15)
BUN: 25 mg/dL — ABNORMAL HIGH (ref 8–23)
CO2: 27 mmol/L (ref 22–32)
Calcium: 8.8 mg/dL — ABNORMAL LOW (ref 8.9–10.3)
Chloride: 100 mmol/L (ref 98–111)
Creatinine, Ser: 1.81 mg/dL — ABNORMAL HIGH (ref 0.61–1.24)
GFR, Estimated: 38 mL/min — ABNORMAL LOW (ref >60)
Glucose, Bld: 100 mg/dL — ABNORMAL HIGH (ref 70–99)
Potassium: 4 mmol/L (ref 3.5–5.1)
Sodium: 137 mmol/L (ref 135–145)

## 2022-08-20 LAB — HEMOGLOBIN A1C
Hgb A1c MFr Bld: 5.2 % (ref 4.8–5.6)
Mean Plasma Glucose: 103 mg/dL

## 2022-08-20 LAB — GLUCOSE, CAPILLARY
Glucose-Capillary: 95 mg/dL (ref 70–99)
Glucose-Capillary: 126 mg/dL — ABNORMAL HIGH (ref 70–99)

## 2022-08-20 LAB — MAGNESIUM: Magnesium: 2.2 mg/dL (ref 1.7–2.4)

## 2022-08-20 MED ORDER — TORSEMIDE 20 MG PO TABS
20.0000 mg | ORAL_TABLET | Freq: Every day | ORAL | Status: DC
  Administered 2022-08-20: 20 mg via ORAL
  Filled 2022-08-20: qty 1

## 2022-08-20 MED ORDER — EMPAGLIFLOZIN 10 MG PO TABS
10.0000 mg | ORAL_TABLET | Freq: Every day | ORAL | 0 refills | Status: DC
  Filled 2022-08-20: qty 30, 30d supply, fill #0

## 2022-08-20 MED ORDER — TORSEMIDE 20 MG PO TABS
20.0000 mg | ORAL_TABLET | Freq: Every day | ORAL | 0 refills | Status: DC
  Filled 2022-08-20: qty 30, 30d supply, fill #0

## 2022-08-20 MED ORDER — AMIODARONE HCL 200 MG PO TABS
ORAL_TABLET | ORAL | 0 refills | Status: DC
  Filled 2022-08-20: qty 90, 30d supply, fill #0

## 2022-08-20 MED ORDER — AMIODARONE HCL 200 MG PO TABS
ORAL_TABLET | ORAL | 0 refills | Status: DC
  Filled 2022-08-20: qty 90, 63d supply, fill #0

## 2022-08-20 MED ORDER — ATORVASTATIN CALCIUM 80 MG PO TABS
80.0000 mg | ORAL_TABLET | Freq: Every day | ORAL | 0 refills | Status: DC
  Filled 2022-08-20: qty 30, 30d supply, fill #0

## 2022-08-20 MED ORDER — CARVEDILOL 6.25 MG PO TABS
6.2500 mg | ORAL_TABLET | Freq: Two times a day (BID) | ORAL | 0 refills | Status: DC
  Filled 2022-08-20: qty 60, 30d supply, fill #0

## 2022-08-20 NOTE — Progress Notes (Signed)
Heart Failure Navigator Progress Note  Assessed for Heart & Vascular TOC clinic readiness.  Patient EF 45-50%, has [facility] follow up appointment on 08/31/22.   Navigator will sign off at this time.  Earnestine Leys, BSN, Clinical cytogeneticist Only

## 2022-08-20 NOTE — TOC Transition Note (Addendum)
Transition of Care [facility]) - CM/SW Discharge Note   Patient Details  Name: Tanner [name] MRN: [id number] Date of Birth: [birth year]  Transition of [facility]) CM/SW Contact:  Zenon Mayo, RN Phone Number: 08/20/2022, 11:13 AM   Clinical Narrative:    From home alone, he uses a cane at home, he has PCP.  He states he also goes to [facility].  His son will transport him home today. Patient says he still drives also.  He has no needs. NCM called [facility], left vm for April of admission.   NCM contacted [place] notification line as well,  ref ID is [id number].    Final next level of care: Home/Self Care Barriers to Discharge: No Barriers Identified   Patient Goals and CMS Choice   Choice offered to / list presented to : NA  Discharge Placement                         Discharge Plan and Services Additional resources added to the After Visit Summary for   In-house Referral: NA Discharge Planning Services: CM Consult Post Acute Care Choice: NA          DME Arranged: N/A DME Agency: NA       HH Arranged: NA          Social Determinants of Health (SDOH) Interventions SDOH Screenings   Food Insecurity: No Food Insecurity (08/18/2022)  Housing: Low Risk  (08/18/2022)  Transportation Needs: No Transportation Needs (08/18/2022)  Utilities: Not At Risk (08/18/2022)  Alcohol Screen: Low Risk  (11/25/2021)  Depression (PHQ2-9): Low Risk  (06/24/2022)  Financial Resource Strain: Low Risk  (11/25/2021)  Physical Activity: Sufficiently Active (11/25/2021)  Social Connections: Unknown (11/25/2021)  Stress: No Stress Concern Present (11/25/2021)  Tobacco Use: Medium Risk (08/19/2022)     Readmission Risk Interventions    08/20/2022   11:10 AM  Readmission Risk Prevention Plan  Transportation Screening Complete  PCP or Specialist Appt within 3-5 Days Complete  HRI or [place] Complete  Palliative Care Screening Not Applicable  Medication Review (RN Care Manager)  Complete

## 2022-08-20 NOTE — Progress Notes (Signed)
Rounding Note    Patient Name: Tanner [name] Date of Encounter: 08/20/2022  [place] Cardiologist: None   Subjective   He feels well this AM. No issues  Inpatient Medications    Scheduled Meds:  [START ON 08/29/2022] amiodarone  200 mg Oral Daily   amiodarone  400 mg Oral BID   apixaban  5 mg Oral BID   atorvastatin  80 mg Oral Daily   buPROPion ER  150 mg Oral BID   carvedilol  6.25 mg Oral BID WC   empagliflozin  10 mg Oral Daily   finasteride  5 mg Oral QHS   gabapentin  300 mg Oral TID   hydrocerin   Topical BID   insulin aspart  0-5 Units Subcutaneous QHS   insulin aspart  0-9 Units Subcutaneous TID WC   multivitamin with minerals  1 tablet Oral Daily   nortriptyline  25 mg Oral QHS   nystatin   Topical BID   torsemide  20 mg Oral Daily   Continuous Infusions:   PRN Meds: acetaminophen   Vital Signs    Vitals:   08/19/22 1928 08/20/22 0013 08/20/22 0326 08/20/22 0806  BP: 126/76 (!) 150/76 139/63 138/60  Pulse:  84 76 95  Resp: '19 18 19 18  '$ Temp: 97.7 F (36.5 C) 97.6 F (36.4 C) 98.2 F (36.8 C) 98.2 F (36.8 C)  TempSrc: Oral Oral Oral Oral  SpO2: 92% 91% 90% 95%  Weight:  100.9 kg    Height:        Intake/Output Summary (Last 24 hours) at 08/20/2022 0947 Last data filed at 08/20/2022 [id number] Gross per 24 hour  Intake 827.98 ml  Output 1000 ml  Net -172.02 ml      08/20/2022   12:13 AM 08/19/2022    1:16 AM 08/18/2022    3:00 PM  Last 3 Weights  Weight (lbs) 222 lb 6.4 oz 225 lb 12 oz 223 lb 9.6 oz  Weight (kg) 100.88 kg 102.4 kg 101.424 kg      Telemetry    NSR - Personally Reviewed  ECG    NA - Personally Reviewed  Physical Exam   Vitals:   08/20/22 0326 08/20/22 0806  BP: 139/63 138/60  Pulse: 76 95  Resp: 19 18  Temp: 98.2 F (36.8 C) 98.2 F (36.8 C)  SpO2: 90% 95%    GEN: No acute distress.   Cardiac: RRR, no murmurs, rubs, or gallops.  Respiratory: Clear to auscultation bilaterally. GI: Soft, nontender,  non-distended  MS: erythema of the R leg Neuro:  Nonfocal  Psych: Normal affect   Labs    High Sensitivity Troponin:  No results for input(s): "TROPONINIHS" in the last 720 hours.   Chemistry Recent Labs  Lab 08/18/22 1033 08/19/22 0104 08/20/22 0045  NA 138 137 137  K 4.3 3.5 4.0  CL 102 99 100  CO2 '23 24 27  '$ GLUCOSE 128* 104* 100*  BUN 23 20 25*  CREATININE 1.45* 1.27* 1.81*  CALCIUM 9.3 8.9 8.8*  MG  --  1.9 2.2  PROT 6.8 6.0*  --   ALBUMIN 3.4* 3.0*  --   AST 25 18  --   ALT 16 13  --   ALKPHOS 62 53  --   BILITOT 1.8* 1.2  --   GFRNONAA 50* 58* 38*  ANIONGAP '13 14 10    '$ Lipids No results for input(s): "CHOL", "TRIG", "HDL", "LABVLDL", "LDLCALC", "CHOLHDL" in the last 168 hours.  Hematology Recent Labs  Lab 08/18/22 1033 08/19/22 0104 08/20/22 0045  WBC 8.3 9.9 8.9  RBC 5.01 4.65 4.63  HGB 15.0 14.0 13.9  HCT 48.5 42.4 42.4  MCV 96.8 91.2 91.6  MCH 29.9 30.1 30.0  MCHC 30.9 33.0 32.8  RDW 18.3* 18.1* 18.2*  PLT 160 185 206   Thyroid  Recent Labs  Lab 08/19/22 0104  TSH 1.915  FREET4 1.34*    BNP Recent Labs  Lab 08/19/22 0104  BNP 200.4*    DDimer No results for input(s): "DDIMER" in the last 168 hours.   Radiology    ECHOCARDIOGRAM COMPLETE  Result Date: 08/19/2022    ECHOCARDIOGRAM REPORT   Patient Name:   Tanner [name] Date of Exam: 08/19/2022 Medical Rec #:  [id number]      Height:       64.0 in Accession #:    [id number]     Weight:       225.7 lb Date of Birth:  [birth year]      BSA:          2.060 m Patient Age:    77 years       BP:           156/60 mmHg Patient Gender: M              HR:           102 bpm. Exam Location:  Inpatient Procedure: 2D Echo and Intracardiac Opacification Agent Indications:    CHF  History:        Patient has prior history of Echocardiogram examinations, most                 recent 02/19/2022. CHF; Risk Factors:Hypertension and Diabetes.  Sonographer:    Harvie Junior Referring Phys: [id number] [facility]  Sonographer  Comments: Technically difficult study due to poor echo windows and patient is obese. Image acquisition challenging due to patient body habitus. IMPRESSIONS  1. Left ventricular ejection fraction, by estimation, is 45 to 50%. The left ventricle has mildly decreased function. The left ventricle demonstrates global hypokinesis. There is mild concentric left ventricular hypertrophy. Left ventricular diastolic parameters are indeterminate.  2. Right ventricular systolic function is normal. The right ventricular size is normal. Tricuspid regurgitation signal is inadequate for assessing PA pressure.  3. The mitral valve is normal in structure. No evidence of mitral valve regurgitation. No evidence of mitral stenosis.  4. The aortic valve is tricuspid. Aortic valve regurgitation is trivial. No aortic stenosis is present.  5. The inferior vena cava is normal in size with greater than 50% respiratory variability, suggesting right atrial pressure of 3 mmHg.  6. The patient was in atrial fibrillation.  7. Technically difficult study with poor acoustic windows. FINDINGS  Left Ventricle: Left ventricular ejection fraction, by estimation, is 45 to 50%. The left ventricle has mildly decreased function. The left ventricle demonstrates global hypokinesis. Definity contrast agent was given IV to delineate the left ventricular  endocardial borders. The left ventricular internal cavity size was normal in size. There is mild concentric left ventricular hypertrophy. Left ventricular diastolic parameters are indeterminate. Right Ventricle: The right ventricular size is normal. Right vetricular wall thickness was not well visualized. Right ventricular systolic function is normal. Tricuspid regurgitation signal is inadequate for assessing PA pressure. Left Atrium: Left atrial size was normal in size. Right Atrium: Right atrial size was normal in size. Pericardium: Trivial pericardial effusion is present. Mitral Valve: The mitral valve is  normal in structure. No evidence of mitral valve regurgitation. No evidence of mitral valve stenosis. Tricuspid Valve: The tricuspid valve is not well visualized. Tricuspid valve regurgitation is not demonstrated. Aortic Valve: The aortic valve is tricuspid. Aortic valve regurgitation is trivial. Aortic regurgitation PHT measures 627 msec. No aortic stenosis is present. Aortic valve mean gradient measures 2.5 mmHg. Aortic valve peak gradient measures 4.0 mmHg. Aortic valve area, by VTI measures 2.43 cm. Pulmonic Valve: The pulmonic valve was normal in structure. Pulmonic valve regurgitation is not visualized. Aorta: The aortic root is normal in size and structure. Venous: The inferior vena cava is normal in size with greater than 50% respiratory variability, suggesting right atrial pressure of 3 mmHg. IAS/Shunts: No atrial level shunt detected by color flow Doppler.  LEFT VENTRICLE PLAX 2D LVIDd:         4.10 cm      Diastology LVIDs:         3.20 cm      LV e' medial:    13.83 cm/s LV PW:         1.10 cm      LV E/e' medial:  7.8 LV IVS:        1.00 cm      LV e' lateral:   16.07 cm/s LVOT diam:     2.10 cm      LV E/e' lateral: 6.8 LV SV:         39 LV SV Index:   19 LVOT Area:     3.46 cm  LV Volumes (MOD) LV vol d, MOD A2C: 54.5 ml LV vol d, MOD A4C: 129.0 ml LV vol s, MOD A2C: 24.9 ml LV vol s, MOD A4C: 50.7 ml LV SV MOD A2C:     29.6 ml LV SV MOD A4C:     129.0 ml LV SV MOD BP:      51.1 ml RIGHT VENTRICLE RV Basal diam:  2.90 cm RV Mid diam:    2.70 cm RV S prime:     11.80 cm/s TAPSE (M-mode): 1.7 cm LEFT ATRIUM           Index LA Vol (A4C): 46.9 ml 22.77 ml/m  AORTIC VALVE                    PULMONIC VALVE AV Area (Vmax):    2.85 cm     PV Vmax:       0.89 m/s AV Area (Vmean):   2.56 cm     PV Peak grad:  3.2 mmHg AV Area (VTI):     2.43 cm AV Vmax:           100.10 cm/s AV Vmean:          70.200 cm/s AV VTI:            0.162 m AV Peak Grad:      4.0 mmHg AV Mean Grad:      2.5 mmHg LVOT Vmax:          82.25 cm/s LVOT Vmean:        51.900 cm/s LVOT VTI:          0.113 m LVOT/AV VTI ratio: 0.70 AI PHT:            627 msec  AORTA Ao Root diam: 3.20 cm Ao Asc diam:  3.00 cm MITRAL VALVE MV Area (PHT): 6.54 cm     SHUNTS MV Decel Time: 116 msec  Systemic VTI:  0.11 m MV E velocity: 108.50 cm/s  Systemic Diam: 2.10 cm MV A velocity: 52.90 cm/s MV E/A ratio:  2.05 Dalton McleanMD Electronically signed by Franki Monte Signature Date/Time: 08/19/2022/1:51:15 PM    Final    DG Chest Port 1 View  Result Date: 08/18/2022 CLINICAL DATA:  Cough. EXAM: PORTABLE CHEST 1 VIEW COMPARISON:  06/17/2022. FINDINGS: 1035 hours. Streaky opacity in the left lung base with patchy bibasilar opacities, may reflect some combination of atelectasis, aspiration or infection. Stable cardiac and mediastinal contours. No pleural effusion or pneumothorax. IMPRESSION: Patchy bibasilar opacities, may reflect some combination of atelectasis, aspiration or infection. Electronically Signed   By: Emmit Alexanders M.D.   On: 08/18/2022 10:52    Cardiac Studies  TTE 08/19/2022 1. Left ventricular ejection fraction, by estimation, is 45 to 50%. The  left ventricle has mildly decreased function. The left ventricle  demonstrates global hypokinesis. There is mild concentric left ventricular  hypertrophy. Left ventricular diastolic  parameters are indeterminate.   2. Right ventricular systolic function is normal. The right ventricular  size is normal. Tricuspid regurgitation signal is inadequate for assessing  PA pressure.   3. The mitral valve is normal in structure. No evidence of mitral valve  regurgitation. No evidence of mitral stenosis.   4. The aortic valve is tricuspid. Aortic valve regurgitation is trivial.  No aortic stenosis is present.   5. The inferior vena cava is normal in size with greater than 50%  respiratory variability, suggesting right atrial pressure of 3 mmHg.   6. The patient was in atrial fibrillation.   7.  Technically difficult study with poor acoustic windows.   TTE 02/19/2022 1. Left ventricular ejection fraction, by estimation, is 45 to 50%. The  left ventricle has mildly decreased function. The left ventricle  demonstrates global hypokinesis. The left ventricular internal cavity size  was mildly dilated. Left ventricular  diastolic parameters are consistent with Grade I diastolic dysfunction  (impaired relaxation).   2. Right ventricular systolic function is normal. The right ventricular  size is normal. Tricuspid regurgitation signal is inadequate for assessing  PA pressure.   3. Left atrial size was mildly dilated.   4. The mitral valve is normal in structure. Moderate mitral valve  regurgitation.   5. The aortic valve is tricuspid. Aortic valve regurgitation is trivial.  No aortic stenosis is present.   6. The inferior vena cava is normal in size with greater than 50%  respiratory variability, suggesting right atrial pressure of 3 mmHg.   Patient Profile     Tanner [name] is a 77 y.o. male with a hx of mitral regurgitation, aortic regurgitation, chronic systolic heart failure, DM type II, DVT and PE (on eliquis), hypertension,  who is being seen 08/18/2022 for the evaluation of afib with RVR at the request of Dr. Jeanell Sparrow.   Assessment & Plan    Paroxysmal Afib: here with RVR - s/p DCCV 3/6 with restoration of sinus rhythm - EF unchanged, no MR - will transition IV amiodarone to oral today; 400 mg BID for 10 days, then 200 mg amiodarone daily thereafter - change home atenolol to coreg 6.25 mg BID [ for better BP control, atenolol not great with fluctuating renal fxn] - continue eliquis 5 mg BID  Chronic HFmrEF No sig MR - euvolemic - s/p IV lasix, restart home torsemide - crt stable - start jardiance 10 mg daily [if cost prohibitive, can dc considering age]  HLD -continue lipitor 80  mg daily  R leg erythema: chronic since [place], from [place] for DC today. We  will work on FU  For questions or updates, please contact [place] Please consult [email or web address] for contact info under     Time Spent Directly with Patient:  I have spent a total of 35 minutes with the patient reviewing hospital notes, telemetry, EKGs, labs and examining the patient as well as establishing an assessment and plan that was discussed personally with the patient.  > 50% of time was spent in direct patient care.      Signed, Janina Mayo, MD  08/20/2022, 9:47 AM

## 2022-08-20 NOTE — TOC Initial Note (Signed)
Transition of Care [facility]) - Initial/Assessment Note    Patient Details  Name: Tanner [name] MRN: [id number] Date of Birth: [birth year]  [facility] CM/SW Contact:    Zenon Mayo, RN Phone Number: 08/20/2022, 11:12 AM  Clinical Narrative:                 From home alone, he uses a cane at home, he has PCP.  He states he also goes to [facility].  His son will transport him home today. Patient says he still drives also.  He has no needs.   Expected Discharge Plan: Home/Self Care Barriers to Discharge: No Barriers Identified   Patient Goals and CMS Choice Patient states their goals for this hospitalization and ongoing recovery are:: return home   Choice offered to / list presented to : NA      Expected Discharge Plan and Services In-house Referral: NA Discharge Planning Services: CM Consult Post Acute Care Choice: NA Living arrangements for the past 2 months: Single Family Home Expected Discharge Date: 08/20/22               DME Arranged: N/A DME Agency: NA       HH Arranged: NA          Prior Living Arrangements/Services Living arrangements for the past 2 months: Single Family Home Lives with:: Self Patient language and need for interpreter reviewed:: Yes Do you feel safe going back to the place where you live?: Yes      Need for Family Participation in Patient Care: Yes (Comment) Care giver support system in place?: Yes (comment)   Criminal Activity/Legal Involvement Pertinent to Current Situation/Hospitalization: No - Comment as needed  Activities of Daily Living Home Assistive Devices/Equipment: Cane (specify quad or straight) ADL Screening (condition at time of admission) Patient's cognitive ability adequate to safely complete daily activities?: Yes Is the patient deaf or have difficulty hearing?: No Does the patient have difficulty seeing, even when wearing glasses/contacts?: No Does the patient have difficulty concentrating,  remembering, or making decisions?: No Patient able to express need for assistance with ADLs?: Yes Does the patient have difficulty dressing or bathing?: No Independently performs ADLs?: Yes (appropriate for developmental age) Does the patient have difficulty walking or climbing stairs?: Yes Weakness of Legs: Both Weakness of Arms/Hands: Both  Permission Sought/Granted                  Emotional Assessment Appearance:: Appears stated age Attitude/Demeanor/Rapport: Engaged Affect (typically observed): Appropriate Orientation: : Oriented to Self, Oriented to Place, Oriented to  Time, Oriented to Situation Alcohol / Substance Use: Not Applicable Psych Involvement: No (comment)  Admission diagnosis:  Paroxysmal atrial fibrillation ([place]) [I48.0] Atrial fibrillation with RVR ([place]) 0000000 Acute systolic CHF (congestive heart failure) ([place]) [I50.21] Dyspnea, unspecified type [R06.00] Patient Active Problem List   Diagnosis Date Noted   Type 2 diabetes mellitus with hyperlipidemia ([place]) 08/19/2022   Atrial fibrillation with RVR ([place]) 08/19/2022   Class 2 obesity 08/19/2022   Paroxysmal atrial fibrillation ([place]) 08/19/2022   Acute on chronic systolic CHF (congestive heart failure) ([place]) 08/18/2022   Jaw pain 06/24/2022   Left hand pain [id number]   Chronic systolic heart failure ([place]) 02/23/2022   Moderate mitral regurgitation 02/23/2022   Leg swelling 01/29/2022   Palpitations 12/05/2021   ED (erectile dysfunction) 01/13/2021   Stiffness of left hand joint 01/13/2021   Joint stiffness of hand, right 01/13/2021   Cyst, dermoid, scalp and neck 01/13/2021  Hypertension associated with type 2 diabetes mellitus ([place]) 05/23/2020   Chronic kidney disease (CKD), stage III (moderate) (HCC) 12/13/2019   Carpal tunnel syndrome 12/05/2019   Elevated creatine kinase 11/15/2019   Aortic atherosclerosis ([place]) 11/15/2019   Recurrent Clostridium difficile diarrhea 05/28/2017   Peripheral  neuropathy 03/17/2017   Diabetes ([place]) 12/11/2016   Allergic rhinitis 12/11/2016   Degenerative arthritis of left knee 01/30/2016   Morbid obesity ([place]) 01/30/2016   History of pulmonary embolus (PE) 12/19/2013   Anxiety state 03/29/2007   Depression 03/29/2007   OSA (obstructive sleep apnea) 03/29/2007   Unspecified glaucoma 03/29/2007   BPH (benign prostatic hyperplasia) 03/29/2007   OA (osteoarthritis) of knee 03/29/2007   Essential hypertension 03/29/2007   History of Bell's palsy 03/29/2007   PCP:  Hoyt Koch, MD Pharmacy:   [id number] [place] [address]Phone: [phone]: [phone] [facility] [address] [place] [place]Phone: [phone]: [phone]    Social Determinants of Health (SDOH) Social History: SDOH Screenings   Food Insecurity: No Food Insecurity (08/18/2022)  Housing: Low Risk  (08/18/2022)  Transportation Needs: No Transportation Needs (08/18/2022)  Utilities: Not At Risk (08/18/2022)  Alcohol Screen: Low Risk  (11/25/2021)  Depression (PHQ2-9): Low Risk  (06/24/2022)  Financial Resource Strain: Low Risk  (11/25/2021)  Physical Activity: Sufficiently Active (11/25/2021)  Social Connections: Unknown (11/25/2021)  Stress: No Stress Concern Present (11/25/2021)  Tobacco Use: Medium Risk (08/19/2022)   SDOH Interventions:     Readmission Risk Interventions    08/20/2022   11:10 AM  Readmission Risk Prevention Plan  Transportation Screening Complete  PCP or Specialist Appt within 3-5 Days Complete  HRI or Home Care Consult Complete  Palliative Care Screening Not Applicable  Medication Review (RN Care Manager) Complete

## 2022-08-20 NOTE — Discharge Summary (Signed)
Physician Discharge Summary   Patient: Tanner [name] MRN: [id number] DOB: 1945/08/02  Admit date:     08/18/2022  Discharge date: 08/20/22  Discharge Physician: Jimmy Picket Halcyon Heck   PCP: Hoyt Koch, MD   Recommendations at discharge:    Patient will continue rhythm control with amiodarone, oral load with 400 mg po bid until 08/27/24, (ten days), then continue with maintenance of 200 mg po daily. Atenolol changed to carvedilol Added SGLT 2 inh and reduced dose of torsemide to 20 mg po daily. Plan to follow up renal function and electrolytes in 7 days as outpatient. Follow up with Dr Sharlet Salina in 7 to 10 days. Follow up with Cardiology as scheduled.     Discharge Diagnoses: Principal Problem:   Acute on chronic systolic CHF (congestive heart failure) (HCC) Active Problems:   Atrial fibrillation with RVR (HCC)   Essential hypertension   Chronic kidney disease (CKD), stage III (moderate) (HCC)   History of pulmonary embolus (PE)   Type 2 diabetes mellitus with hyperlipidemia (HCC)   Class 2 obesity   Depression   Paroxysmal atrial fibrillation (HCC)  Resolved Problems:   * No resolved hospital problems. [facility] Course: Tanner [name] was admitted to the hospital with the working diagnosis of heart failure exacerbation.   77 yo male with the past medical history of heart failure, T2DM, hypertension and dyslipidemia who presented with dyspnea. Positive worsening dyspnea, lower extremity edema and orthopnea. EMS was called and he was found in atrial fibrillation with RVR. In the ED he was placed on IV diltiazem for rate control, hid blood pressure was 175/101, HR 102, RR 24, 02 saturation 97%, heart with S1 and S2 present irregularly irregular with no gallops, rubs or murmurs, lungs with decreased breath sounds at bases, abdomen with no distention and bilateral lower extremity edema.   Na 138, K 4,3 Cl 102. Bicarbonate 23, glucose 128, bun 23 cr 1,45  Sars covid 19  negative   Chest radiograph with mild cardiomegaly and bilateral atelectasis at bases.   EKG 125 bpm, left axis with right bundle branch block, qtc 518, atrial flutter with variable block, positive PVC, no significant ST segment or T wave changes.   Patient was transitioned to amiodarone with good toleration.  03/06 direct current cardioversion, converting to sinus rhythm.  03/07 patient with improvement in volume status, plan to discharge home and follow up as outpatient.       Assessment and Plan: * Acute on chronic systolic CHF (congestive heart failure) (HCC) Echocardiogram with mild reduction in LV systolic function 45 to [id number], with global hypokinesis. RV systolic function preserved, moderate mitral regurgitation.  Patient was placed on IV furosemide for diuresis, negative fluid balance was achieved, with significant improvement in his symptoms.   Patient will continue medical management with carvedilol and empagliflozin. Loop diuretic with torsemide.    Lower extremity erythema, possible related to vascular insufficiency.  Cellulitis ruled out and antibiotic therapy was discontinued.   Atrial fibrillation with RVR (HCC) Paroxysmal atrial fibrillation.  Patient was placed on IV amiodarone load and underwent direct current cardioversion on 08/19/22, with successful restoration of sinus rhythm. Plan to continue with oral amiodarone load and continue anticoagulation with apixaban.    Essential hypertension Continue blood pressure control with carvedilol.   Chronic kidney disease (CKD), stage III (moderate) (HCC) AKI on CKD stage 3a to 3b.   Patient had improvement in his volume status, on the day of his discharge his renal function had  a serum cr of 1,81 with K at 4,0 and serum bicarbonate at 27. Na 137 and Mg, 2,2   Plan to continue diuresis with torsemide and empagliflozin.  Follow up renal function as outpatient.   History of pulmonary embolus (PE) Continue  anticoagulation with apixaban.   Type 2 diabetes mellitus with hyperlipidemia ([place]) His glucose remained stable during his hospitalization.  Continue semaglutide as outpatient.  Continue with statin therapy.    Class 2 obesity Calculated BMI is 38.7   Depression Continue with nortriptyline and alprazolam.          Consultants: cardiology  Procedures performed: direct current cardioversion   Disposition: Home Diet recommendation:  Discharge Diet Orders (From admission, onward)     Start     Ordered   08/20/22 0000  Diet - low sodium heart healthy        08/20/22 1101           Cardiac and Carb modified diet DISCHARGE MEDICATION: Allergies as of 08/20/2022       Reactions   Lisinopril Other (See Comments)    Renal impairment   Amlodipine Swelling   Codeine Sulfate Itching, Nausea Only        Medication List     STOP taking these medications    amoxicillin-clavulanate 875-125 MG tablet Commonly known as: AUGMENTIN   atenolol 25 MG tablet Commonly known as: TENORMIN   clindamycin 75 MG/5ML solution Commonly known as: CLEOCIN   ibuprofen 100 MG/5ML suspension Commonly known as: ADVIL   predniSONE 20 MG tablet Commonly known as: DELTASONE       TAKE these medications    acetaminophen 500 MG tablet Commonly known as: TYLENOL Take 1,000 mg by mouth every 6 (six) hours as needed for moderate pain.   acidophilus Caps capsule Take 1 capsule by mouth daily.   amiodarone 200 MG tablet Commonly known as: PACERONE Take 2 tablets twice daily for 10 days, until 08/28/22, then continue with one tablet daily starting on 08/29/22. Start taking on: August 29, 2022   atorvastatin 80 MG tablet Commonly known as: LIPITOR Take 1 tablet (80 mg total) by mouth daily. Start taking on: August 21, 2022 What changed: how much to take   buPROPion 150 MG 12 hr tablet Commonly known as: WELLBUTRIN SR Take 150 mg by mouth 2 (two) times daily.   carvedilol 6.25 MG  tablet Commonly known as: COREG Take 1 tablet (6.25 mg total) by mouth 2 (two) times daily with a meal.   clobetasol ointment 0.05 % Commonly known as: TEMOVATE Apply 1 Application topically daily as needed.   Eliquis 5 MG Tabs tablet Generic drug: apixaban Take 5 mg by mouth 2 (two) times daily.   empagliflozin 10 MG Tabs tablet Commonly known as: JARDIANCE Take 1 tablet (10 mg total) by mouth daily. Start taking on: August 21, 2022   finasteride 5 MG tablet Commonly known as: PROSCAR Take 5 mg by mouth at bedtime.   gabapentin 300 MG capsule Commonly known as: NEURONTIN Take 300-600 mg by mouth See admin instructions. Take 600 mg by mouth three times daily and  300 mg at night   ketoconazole 2 % shampoo Commonly known as: NIZORAL Apply 1 application topically every other day.   lidocaine 2 % solution Commonly known as: XYLOCAINE Use as directed 15 mLs in the mouth or throat as needed for mouth pain.   multivitamins ther. w/minerals Tabs tablet Take 1 tablet by mouth daily.   nortriptyline 10 MG  capsule Commonly known as: PAMELOR 2 CAPSULES EVERY NIGHT AT BEDTIME What changed:  how much to take how to take this when to take this additional instructions   saccharomyces boulardii 250 MG capsule Commonly known as: FLORASTOR Take 1 capsule (250 mg total) by mouth 2 (two) times daily.   Semaglutide (1 MG/DOSE) 4 MG/3ML Sopn Inject 1 mg as directed once a week.   Semaglutide (2 MG/DOSE) 8 MG/3ML Sopn Inject 2 mg as directed once a week.   sildenafil 100 MG tablet Commonly known as: VIAGRA Take 100 mg by mouth as needed.   Thera-Gesic Plus Crea Apply 1 application topically at bedtime. Apply to lower back   torsemide 20 MG tablet Commonly known as: DEMADEX Take 1 tablet (20 mg total) by mouth daily. Start taking on: August 21, 2022 What changed: how much to take        Discharge Exam: Filed Weights   08/18/22 1500 08/19/22 0116 08/20/22 0013  Weight:  101.4 kg 102.4 kg 100.9 kg   BP 138/60 (BP Location: Right Arm)   Pulse 95   Temp 98.2 F (36.8 C) (Oral)   Resp 18   Ht '5\' 4"'$  (1.626 m)   Wt 100.9 kg   SpO2 95%   BMI 38.17 kg/m   Patient is feeling better, dyspnea and edema have improved, no chest pain or palpitations.   Neurology awake and alert ENT with mild pallor Cardiovascular with S1 and S2 present and positive murmur at the right lower sternal border, systolic.  Respiratory with no rales or wheezing Abdomen with no distention   Condition at discharge: stable  The results of significant diagnostics from this hospitalization (including imaging, microbiology, ancillary and laboratory) are listed below for reference.   Imaging Studies: ECHOCARDIOGRAM COMPLETE  Result Date: 08/19/2022    ECHOCARDIOGRAM REPORT   Patient Name:   EAGLE KILDUFF Date of Exam: 08/19/2022 Medical Rec #:  [id number]      Height:       64.0 in Accession #:    [id number]     Weight:       225.7 lb Date of Birth:  1945-08-07      BSA:          2.060 m Patient Age:    29 years       BP:           156/60 mmHg Patient Gender: M              HR:           102 bpm. Exam Location:  Inpatient Procedure: 2D Echo and Intracardiac Opacification Agent Indications:    CHF  History:        Patient has prior history of Echocardiogram examinations, most                 recent 02/19/2022. CHF; Risk Factors:Hypertension and Diabetes.  Sonographer:    Harvie Junior Referring Phys: [id number] [facility]  Sonographer Comments: Technically difficult study due to poor echo windows and patient is obese. Image acquisition challenging due to patient body habitus. IMPRESSIONS  1. Left ventricular ejection fraction, by estimation, is 45 to 50%. The left ventricle has mildly decreased function. The left ventricle demonstrates global hypokinesis. There is mild concentric left ventricular hypertrophy. Left ventricular diastolic parameters are indeterminate.  2. Right ventricular systolic function  is normal. The right ventricular size is normal. Tricuspid regurgitation signal is inadequate for assessing PA pressure.  3. The mitral valve  is normal in structure. No evidence of mitral valve regurgitation. No evidence of mitral stenosis.  4. The aortic valve is tricuspid. Aortic valve regurgitation is trivial. No aortic stenosis is present.  5. The inferior vena cava is normal in size with greater than 50% respiratory variability, suggesting right atrial pressure of 3 mmHg.  6. The patient was in atrial fibrillation.  7. Technically difficult study with poor acoustic windows. FINDINGS  Left Ventricle: Left ventricular ejection fraction, by estimation, is 45 to 50%. The left ventricle has mildly decreased function. The left ventricle demonstrates global hypokinesis. Definity contrast agent was given IV to delineate the left ventricular  endocardial borders. The left ventricular internal cavity size was normal in size. There is mild concentric left ventricular hypertrophy. Left ventricular diastolic parameters are indeterminate. Right Ventricle: The right ventricular size is normal. Right vetricular wall thickness was not well visualized. Right ventricular systolic function is normal. Tricuspid regurgitation signal is inadequate for assessing PA pressure. Left Atrium: Left atrial size was normal in size. Right Atrium: Right atrial size was normal in size. Pericardium: Trivial pericardial effusion is present. Mitral Valve: The mitral valve is normal in structure. No evidence of mitral valve regurgitation. No evidence of mitral valve stenosis. Tricuspid Valve: The tricuspid valve is not well visualized. Tricuspid valve regurgitation is not demonstrated. Aortic Valve: The aortic valve is tricuspid. Aortic valve regurgitation is trivial. Aortic regurgitation PHT measures 627 msec. No aortic stenosis is present. Aortic valve mean gradient measures 2.5 mmHg. Aortic valve peak gradient measures 4.0 mmHg. Aortic valve  area, by VTI measures 2.43 cm. Pulmonic Valve: The pulmonic valve was normal in structure. Pulmonic valve regurgitation is not visualized. Aorta: The aortic root is normal in size and structure. Venous: The inferior vena cava is normal in size with greater than 50% respiratory variability, suggesting right atrial pressure of 3 mmHg. IAS/Shunts: No atrial level shunt detected by color flow Doppler.  LEFT VENTRICLE PLAX 2D LVIDd:         4.10 cm      Diastology LVIDs:         3.20 cm      LV e' medial:    13.83 cm/s LV PW:         1.10 cm      LV E/e' medial:  7.8 LV IVS:        1.00 cm      LV e' lateral:   16.07 cm/s LVOT diam:     2.10 cm      LV E/e' lateral: 6.8 LV SV:         39 LV SV Index:   19 LVOT Area:     3.46 cm  LV Volumes (MOD) LV vol d, MOD A2C: 54.5 ml LV vol d, MOD A4C: 129.0 ml LV vol s, MOD A2C: 24.9 ml LV vol s, MOD A4C: 50.7 ml LV SV MOD A2C:     29.6 ml LV SV MOD A4C:     129.0 ml LV SV MOD BP:      51.1 ml RIGHT VENTRICLE RV Basal diam:  2.90 cm RV Mid diam:    2.70 cm RV S prime:     11.80 cm/s TAPSE (M-mode): 1.7 cm LEFT ATRIUM           Index LA Vol (A4C): 46.9 ml 22.77 ml/m  AORTIC VALVE                    PULMONIC VALVE AV  Area (Vmax):    2.85 cm     PV Vmax:       0.89 m/s AV Area (Vmean):   2.56 cm     PV Peak grad:  3.2 mmHg AV Area (VTI):     2.43 cm AV Vmax:           100.10 cm/s AV Vmean:          70.200 cm/s AV VTI:            0.162 m AV Peak Grad:      4.0 mmHg AV Mean Grad:      2.5 mmHg LVOT Vmax:         82.25 cm/s LVOT Vmean:        51.900 cm/s LVOT VTI:          0.113 m LVOT/AV VTI ratio: 0.70 AI PHT:            627 msec  AORTA Ao Root diam: 3.20 cm Ao Asc diam:  3.00 cm MITRAL VALVE MV Area (PHT): 6.54 cm     SHUNTS MV Decel Time: 116 msec     Systemic VTI:  0.11 m MV E velocity: 108.50 cm/s  Systemic Diam: 2.10 cm MV A velocity: 52.90 cm/s MV E/A ratio:  2.05 Tanner [name] Electronically signed by Franki Monte Signature Date/Time: 08/19/2022/1:51:15 PM    Final     DG Chest Port 1 View  Result Date: 08/18/2022 CLINICAL DATA:  Cough. EXAM: PORTABLE CHEST 1 VIEW COMPARISON:  06/17/2022. FINDINGS: 1035 hours. Streaky opacity in the left lung base with patchy bibasilar opacities, may reflect some combination of atelectasis, aspiration or infection. Stable cardiac and mediastinal contours. No pleural effusion or pneumothorax. IMPRESSION: Patchy bibasilar opacities, may reflect some combination of atelectasis, aspiration or infection. Electronically Signed   By: Emmit Alexanders M.D.   On: 08/18/2022 10:52    Microbiology: Results for orders placed or performed during the hospital encounter of 06/17/22  Resp panel by RT-PCR (RSV, Flu A&B, Covid) Anterior Nasal Swab     Status: None   Collection Time: 06/17/22 11:05 AM   Specimen: Anterior Nasal Swab  Result Value Ref Range Status   SARS Coronavirus 2 by RT PCR NEGATIVE NEGATIVE Final    Comment: (NOTE) SARS-CoV-2 target nucleic acids are NOT DETECTED.  The SARS-CoV-2 RNA is generally detectable in upper respiratory specimens during the acute phase of infection. The lowest concentration of SARS-CoV-2 viral copies this assay can detect is 138 copies/mL. A negative result does not preclude SARS-Cov-2 infection and should not be used as the sole basis for treatment or other patient management decisions. A negative result may occur with  improper specimen collection/handling, submission of specimen other than nasopharyngeal swab, presence of viral mutation(s) within the areas targeted by this assay, and inadequate number of viral copies(<138 copies/mL). A negative result must be combined with clinical observations, patient history, and epidemiological information. The expected result is Negative.  Fact Sheet for Patients:  EntrepreneurPulse.com.au  Fact Sheet for Healthcare Providers:  IncredibleEmployment.be  This test is no t yet approved or cleared by the [place] FDA and  has been authorized for detection and/or diagnosis of SARS-CoV-2 by FDA under an Emergency Use Authorization (EUA). This EUA will remain  in effect (meaning this test can be used) for the duration of the COVID-19 declaration under Section 564(b)(1) of the Act, 21 U.S.C.section 360bbb-3(b)(1), unless the authorization is terminated  or revoked sooner.  Influenza A by PCR NEGATIVE NEGATIVE Final   Influenza B by PCR NEGATIVE NEGATIVE Final    Comment: (NOTE) The Xpert Xpress SARS-CoV-2/FLU/RSV plus assay is intended as an aid in the diagnosis of influenza from Nasopharyngeal swab specimens and should not be used as a sole basis for treatment. Nasal washings and aspirates are unacceptable for Xpert Xpress SARS-CoV-2/FLU/RSV testing.  Fact Sheet for Patients: EntrepreneurPulse.com.au  Fact Sheet for Healthcare Providers: IncredibleEmployment.be  This test is not yet approved or cleared by the [place] FDA and has been authorized for detection and/or diagnosis of SARS-CoV-2 by FDA under an Emergency Use Authorization (EUA). This EUA will remain in effect (meaning this test can be used) for the duration of the COVID-19 declaration under Section 564(b)(1) of the Act, 21 U.S.C. section 360bbb-3(b)(1), unless the authorization is terminated or revoked.     Resp Syncytial Virus by PCR NEGATIVE NEGATIVE Final    Comment: (NOTE) Fact Sheet for Patients: EntrepreneurPulse.com.au  Fact Sheet for Healthcare Providers: IncredibleEmployment.be  This test is not yet approved or cleared by the [place] FDA and has been authorized for detection and/or diagnosis of SARS-CoV-2 by FDA under an Emergency Use Authorization (EUA). This EUA will remain in effect (meaning this test can be used) for the duration of the COVID-19 declaration under Section 564(b)(1) of the Act, 21 U.S.C. section  360bbb-3(b)(1), unless the authorization is terminated or revoked.  Performed at [facility], [place] [address] [place], [place] [address]     Labs: CBC: Recent Labs  Lab 08/18/22 1033 08/19/22 0104 08/20/22 0045  WBC 8.3 9.9 8.9  NEUTROABS  --   --  5.3  HGB 15.0 14.0 13.9  HCT 48.5 42.4 42.4  MCV 96.8 91.2 91.6  PLT 160 185 [id number]   Basic Metabolic Panel: Recent Labs  Lab 08/18/22 1033 08/19/22 0104 08/20/22 0045  NA 138 137 137  K 4.3 3.5 4.0  CL 102 99 100  CO2 '23 24 27  '$ GLUCOSE 128* 104* 100*  BUN 23 20 25*  CREATININE 1.45* 1.27* 1.81*  CALCIUM 9.3 8.9 8.8*  MG  --  1.9 2.2  PHOS  --  3.5  --    Liver Function Tests: Recent Labs  Lab 08/18/22 1033 08/19/22 0104  AST 25 18  ALT 16 13  ALKPHOS 62 53  BILITOT 1.8* 1.2  PROT 6.8 6.0*  ALBUMIN 3.4* 3.0*   CBG: Recent Labs  Lab 08/19/22 0608 08/19/22 1147 08/19/22 1629 08/19/22 2105 08/20/22 0612  GLUCAP 95 94 107* 98 95    Discharge time spent: greater than 30 minutes.  Signed: Tawni Millers, MD [facility] 08/20/2022

## 2022-08-21 ENCOUNTER — Telehealth: Payer: Self-pay | Admitting: *Deleted

## 2022-08-21 ENCOUNTER — Encounter: Payer: Self-pay | Admitting: *Deleted

## 2022-08-21 NOTE — Progress Notes (Signed)
  Care Coordination   Note   08/21/2022 Name: Tanner [name] MRN: [id number] DOB: 1946-03-17  Tanner [name] is a 77 y.o. year old male who sees Hoyt Koch, MD for primary care. I reached out to Lizabeth Leyden by phone today to offer care coordination services.  Tanner [name] was given information about Care Coordination services today including:   The Care Coordination services include support from the care team which includes your Nurse Coordinator, Clinical Social Worker, or Pharmacist.  The Care Coordination team is here to help remove barriers to the health concerns and goals most important to you. Care Coordination services are voluntary, and the patient may decline or stop services at any time by request to their care team member.   Care Coordination Consent Status: Patient agreed to services and verbal consent obtained.   Follow up plan:  Telephone appointment with care coordination team member scheduled for:  08/27/2022  Encounter Outcome:  Pt. Scheduled from referral   Julian Hy, [place] Direct Dial: [phone]

## 2022-08-21 NOTE — Transitions of Care (Post Inpatient/ED Visit) (Signed)
08/21/2022  Name: Tanner [name] MRN: [id number] DOB: [birth year]  Today's TOC FU Call Status: Today's TOC FU Call Status:: Successful TOC FU Call Competed TOC FU Call Complete Date: 08/21/22  Transition Care Management Follow-up Telephone Call Date of Discharge: 08/20/22 Discharge Facility: [facility]) Type of Discharge: Inpatient Admission Primary Inpatient Discharge Diagnosis:: Acute CHF exacerbation with A-Fib/ cardioversion with successful conversion How have you been since you were released from the hospital?: Better ("I am doing okay, I guess, just still feel very weak.  My blood pressure was a lot lower this morning than it was before I went into the hospital; it was 102/63; I don't feel dizzy, just kind of weak after being in the hospital.  I check my BP's at home") Any questions or concerns?: Yes Patient Questions/Concerns:: "lower blood pressure than normal"  during medication review-- noted patient has significant confusion around amiodaraone loading dose instructions Patient Questions/Concerns Addressed: Other: (see interventions below; extensive education/ reinforcement provided around both-- patient requires slow guidance and much reinforcement)  Items Reviewed: Did you receive and understand the discharge instructions provided?: Yes (thoroughly reviewed with patient who verbalizes fair understanding of same) Medications obtained and verified?: Yes (Medications Reviewed) (full medication review completed; confirmed patient obtained/ is taking all newly Rx'd medications- reports significant confusion around amiodaraone loading dose- education provided- NEEDS REINFORCEMENT; self-manages medications) Any new allergies since your discharge?: No Dietary orders reviewed?: Yes Type of Diet Ordered:: Heart Healthy; low salt Do you have support at home?: Yes People in Home: alone Name of Support/Comfort Primary Source: lives alone; local adult son checks in frequently; patient  reports he is independent in self care activities  [place] and Equipment/Supplies: [place] Ordered?: No Any new equipment or medical supplies ordered?: No  Functional Questionnaire: Do you need assistance with bathing/showering or dressing?: No Do you need assistance with meal preparation?: No Do you need assistance with eating?: No Do you have difficulty maintaining continence: No Do you need assistance with getting out of bed/getting out of a chair/moving?: No Do you have difficulty managing or taking your medications?: No  Folllow up appointments reviewed: PCP Follow-up appointment confirmed?: Yes Date of PCP follow-up appointment?: 09/01/22 Follow-up Provider: PCP [facility] Follow-up appointment confirmed?: Yes Date of Specialist follow-up appointment?: 08/31/22 Follow-Up Specialty Provider:: cardiology Do you need transportation to your follow-up appointment?: No Do you understand care options if your condition(s) worsen?: Yes-patient verbalized understanding  SDOH Interventions Today    Flowsheet Row Most Recent Value  SDOH Interventions   Food Insecurity Interventions Intervention Not Indicated  Transportation Interventions Intervention Not Indicated  [reports drives self,  son assists as needed/ indicated]      TOC Interventions Today    Flowsheet Row Most Recent Value  TOC Interventions   TOC Interventions Discussed/Reviewed TOC Interventions Discussed  [provided my direct contact information should questions/ concerns/ needs arise post-TOC call, prior to RN CM telephone visit 08/27/22]      Interventions Today    Flowsheet Row Most Recent Value  Chronic Disease   Chronic disease during today's visit Atrial Fibrillation (AFib), Congestive Heart Failure (CHF)  General Interventions   General Interventions Discussed/Reviewed General Interventions Discussed, Doctor Visits, Communication with  [confirmed patient has been scheduled with  RN CM for ongoing care coordination outreach,  telephone visit scheduled for 08/27/22]  Doctor Visits Discussed/Reviewed Specialist, PCP, Doctor Visits Discussed  PCP/Specialist Visits Compliance with follow-up visit  Communication with PCP/Specialists, RN  [regarding patients noted confusion  around amiodarone loading dosing,  along with reported asympotomatic low blood pressure readings at home-- as FYI]  Education Interventions   Education Provided Provided Education  Provided Verbal Education On Other, Medication, When to see the doctor  [signs/ symptoms and corresponding action plan for low blood pressure,  need to continue monitoring and recording blood pressures at home,  significant reinforcement and education around medications]  Nutrition Interventions   Nutrition Discussed/Reviewed Nutrition Discussed, Decreasing salt  Pharmacy Interventions   Pharmacy Dicussed/Reviewed Pharmacy Topics Discussed, Medications and their functions  [significant reinforcement/ education re: discharge instructions for amiodaraone loading dose, when to switch to (1) pill QD- on 08/29/22- pt. eventually able to verbalize accurate understanding,  education around which medications can lead to low BP]  Safety Interventions   Safety Discussed/Reviewed Fall Risk  [Fall assessment and education provided]      Oneta Rack, RN, BSN, CCRN Alumnus RN CM Care Coordination/ Transition of [place] Management [phone]: direct office

## 2022-08-23 ENCOUNTER — Encounter (HOSPITAL_COMMUNITY): Payer: Self-pay | Admitting: Cardiology

## 2022-08-24 ENCOUNTER — Ambulatory Visit: Payer: Self-pay

## 2022-08-24 NOTE — Chronic Care Management (AMB) (Signed)
   08/24/2022  Lizabeth Leyden 01/22/1946 [id number]  Reason for Encounter: Change in CCM enrollment status    Horris Latino RN Care Manager/Chronic Care Management [phone]

## 2022-08-25 ENCOUNTER — Encounter: Payer: Self-pay | Admitting: *Deleted

## 2022-08-25 ENCOUNTER — Ambulatory Visit: Payer: Self-pay | Admitting: *Deleted

## 2022-08-25 ENCOUNTER — Ambulatory Visit (INDEPENDENT_AMBULATORY_CARE_PROVIDER_SITE_OTHER): Payer: Self-pay | Admitting: Family Medicine

## 2022-08-25 NOTE — Patient Outreach (Signed)
Care Coordination outreach to RN [facility] Care Coordinator scheduled for 08/27/22  Made RN CM Care Coordinator aware of ED EMMI Red Flag from inpatient admission, "sad, hopeless, anxious/ empty," so this may addressed with patient at time of scheduled call  Oneta Rack, RN, BSN, CCRN Alumnus RN CM Care Coordination/ Transition of [place] Management [phone]: direct office

## 2022-08-25 NOTE — Telephone Encounter (Signed)
Regarding: R/[place]  ----- Message from Laurann Montana sent at 08/25/2022 11:55 AM EDT -----  Natalia, DO     Hello!  Medication Requested:apixaban (ELIQUIS) 5 mg Oral Tablet    Preferred Pharmacy     [facility], [place] [id number]    [address] [place] [address]    Phone: [phone] Fax: [phone]    Hours: Not open 24 hours      Thanks!  Laurann Montana

## 2022-08-25 NOTE — Telephone Encounter (Signed)
It looks like for some reason this is not going through electronically. I called and spoke to [place] at [facility] and gave verbal.       Disp Refills Start End     apixaban (ELIQUIS) 5 mg Oral Tablet 180 Tablet 1 08/18/2022 --    Sig - Route: Take 1 Tablet (5 mg total) by mouth Twice daily

## 2022-08-27 ENCOUNTER — Ambulatory Visit: Payer: Self-pay

## 2022-08-27 NOTE — Patient Instructions (Addendum)
Visit Information  Thank you for taking time to visit with me today. Please don't hesitate to contact me if I can be of assistance to you.   Following are the goals we discussed today:   Goals Addressed             This Visit's Progress    continue to improve post hospitalization       Interventions Today    Flowsheet Row Most Recent Value  Chronic Disease   Chronic disease during today's visit Congestive Heart Failure (CHF), Atrial Fibrillation (AFib)  General Interventions   General Interventions Discussed/Reviewed General Interventions Discussed, Doctor Visits  Doctor Visits Discussed/Reviewed Doctor Visits Discussed, Doctor Visits Reviewed, Specialist  PCP/Specialist Visits Compliance with follow-up visit  Education Interventions   Education Provided Provided Education, Provided Web-based Education  Provided Verbal Education On Other, Medication, Nutrition, When to see the doctor  [discussed signs/symptoms of HF exacerbation]  Mental Health Interventions   Mental Health Discussed/Reviewed Coping Strategies, Other  [Active listening.]  Nutrition Interventions   Nutrition Discussed/Reviewed Decreasing salt, Nutrition Discussed  [name] eats a lot of ready prepared meals. discussed sodium and how to real a lable for sodium content]  Pharmacy Interventions   Pharmacy Dicussed/Reviewed Pharmacy Topics Discussed, Medications and their functions, Medication Adherence            Our next appointment is by telephone on 09/03/22 at 3:00 pm  Please call the care guide team at [phone] if you need to cancel or reschedule your appointment.   If you are experiencing a Mental Health or [place] or need someone to talk to, please call the Suicide and Crisis Lifeline: [place], RN, MSN, BSN, [employer] [phone]

## 2022-08-27 NOTE — Patient Outreach (Signed)
  Care Coordination   Initial Visit Note   08/27/2022 Name: Tanner [name] MRN: [id number] DOB: 02-22-1946  Tanner [name] is a 77 y.o. year old male who sees Tanner Koch, MD for primary care. I spoke with  Tanner [name] by phone today.  What matters to the patients health and wellness today?  Recent admission 08/18/22-08/20/22 with a/c heart failure, PAF. Tanner [name] reports he is doing better since hospitalization. RNCM noted per chart review: red flag "sad, hopeless, anxious/ empty," Patient states, "I'm not hopeless, but not real positive. I'd like to work my way back to where I was" referring to activities and socialization. Patient reports his wife has passed, but states has two sons who live in [place] and reports they are supportive.   Goals Addressed             This Visit's Progress    continue to improve post hospitalization       Interventions Today    Flowsheet Row Most Recent Value  Chronic Disease   Chronic disease during today's visit Congestive Heart Failure (CHF), Atrial Fibrillation (AFib)  General Interventions   General Interventions Discussed/Reviewed General Interventions Discussed, Doctor Visits  Doctor Visits Discussed/Reviewed Doctor Visits Discussed, Doctor Visits Reviewed, Specialist  PCP/Specialist Visits Compliance with follow-up visit  Education Interventions   Education Provided Provided Education, Provided Web-based Education  Provided Verbal Education On Other, Medication, Nutrition, When to see the doctor  [discussed signs/symptoms of HF exacerbation]  Mental Health Interventions   Mental Health Discussed/Reviewed Coping Strategies, Other  [Active listening.]  Nutrition Interventions   Nutrition Discussed/Reviewed Decreasing salt, Nutrition Discussed  Tanner [name] eats a lot of ready prepared meals. discussed sodium and how to real a lable for sodium content]  Pharmacy Interventions   Pharmacy Dicussed/Reviewed Pharmacy Topics Discussed,  Medications and their functions, Medication Adherence            SDOH assessments and interventions completed:  Yesrecently completed readdress and no changes.   Care Coordination Interventions:  Yes, provided   Follow up plan: Follow up call scheduled for 09/03/22    Encounter Outcome:  Pt. Visit Completed   Thea Silversmith, RN, MSN, BSN, [place] Coordinator [phone]

## 2022-08-31 ENCOUNTER — Encounter: Payer: Self-pay | Admitting: Internal Medicine

## 2022-08-31 ENCOUNTER — Ambulatory Visit: Payer: Medicare HMO | Attending: Internal Medicine | Admitting: Internal Medicine

## 2022-08-31 VITALS — BP 150/78 | HR 72 | Ht 64.5 in | Wt 236.0 lb

## 2022-08-31 DIAGNOSIS — I480 Paroxysmal atrial fibrillation: Secondary | ICD-10-CM

## 2022-08-31 NOTE — Patient Instructions (Addendum)
Medication Instructions:   Your physician recommends that you continue on your current medications as directed. Please refer to the Current Medication list given to you today.  *If you need a refill on your cardiac medications before your next appointment, please call your pharmacy*  Lab Work: Your physician recommends that you return for lab work TOMORROW:  Hepatic (Liver) Function Test  TSH   If you have labs (blood work) drawn today and your tests are completely normal, you will receive your results only by: [place] (if you have MyChart) OR A paper copy in the mail If you have any lab test that is abnormal or we need to change your treatment, we will call you to review the results.  Testing/Procedures: NONE ordered at this time of appointment   Follow-Up: At [facility], you and your health needs are our priority.  As part of our continuing mission to provide you with exceptional heart care, we have created designated Provider Care Teams.  These Care Teams include your primary Cardiologist (physician) and Advanced Practice Providers (APPs -  Physician Assistants and Nurse Practitioners) who all work together to provide you with the care you need, when you need it.  Your next appointment:   6 month(s)  Provider:   Janina Mayo, MD     Other Instructions

## 2022-08-31 NOTE — Progress Notes (Signed)
Cardiology Office Note:    Date:  08/31/2022   ID:  [name], DOB 1945-09-15, MRN [id number]  PCP:  Tanner Koch, MD   Tanner [name] Cardiologist:  Tanner Mayo, MD     Referring MD: Tanner [name], *   No chief complaint on file. Paroxysmal atrial fibrillation  History of Present Illness:    Tanner [name] is a 77 y.o. male with a hx of HFpEF, type II DM, CKD stage III, DVT and PE (on prior eliquis), hypertension,  who is being seen 08/18/2022 for the evaluation of afib with RVR. He was brought via EMS with SOB. He underwent DCCV on 3/6 with 1 shock at 200J. He restored sinus rhythm. He was started on IV amiodarone which was transitioned to oral amiodarone. His home atenolol to coreg. He was mainatined on eliquis. TSH is normal. He had some decompensation and had IV lasix. He was transitioned to his home torsemide.   Had hospital admission 03/26/2022, saw Dr. Marlou [name]. He was had leg edema. He was noted to have moderate MR. Managed with diuretics.   Today, blood pressures well controlled at home 120s-130s. He has felt fair since he left the hospital. He notes scale at the hospital he did not have clothes on. At home he weighed 232.3. He notes planning for knee replacement, [facility].  His sons help him with grocery shopping.  He has 5 stairs going into his house , he denies CP or SOB. No palpitations. No syncope. No orthopnea or PND. Had prior sleep study that was negative.  Wt Readings from Last 3 Encounters:  08/31/22 236 lb (107 kg)  08/20/22 222 lb 6.4 oz (100.9 kg)  07/17/22 239 lb (108.4 kg)     Cardiology Studies: TTE 08/19/2022: EF 45-50%, can be challenging to assess EF with afib. Otherwise no significant valve dx. RA pressure 3 mmHg.   Past Medical History:  Diagnosis Date   Acute kidney failure (HCC)    Anxiety    Arthritis    Back pain    BPH (benign prostatic hypertrophy)    Clostridium difficile infection     Clotting disorder ([place])    Depression    Diabetes ([place]) 12/11/2016   type 2    DVT (deep venous thrombosis) (HCC)    DVT of deep femoral vein, right ([place]) 06/29/2018   Edema, lower extremity    High cholesterol    Hypertension    Joint pain    Low back pain potentially associated with spinal stenosis    Neuromuscular disorder (HCC)    Neuropathy of lower extremity    bilateral   OSA (obstructive sleep apnea)    pt denies    Osteoarthritis    PE (pulmonary embolism)    Pneumonia    hx of x 2    Ventral hernia     Past Surgical History:  Procedure Laterality Date   CARDIOVERSION N/A 08/19/2022   Procedure: CARDIOVERSION;  Surgeon: Tanner Bergeron, MD;  Location: [facility];  Service: Cardiovascular;  Laterality: N/A;   [facility] Left 08/26/2020   Procedure: LEFT TOTAL KNEE ARTHROPLASTY;  Surgeon: Frederik Pear, MD;  Location: [facility];  Service: Orthopedics;  Laterality: Left;    Current Medications: Current Outpatient Medications on File Prior to Visit  Medication Sig Dispense Refill   acetaminophen (TYLENOL) 500 MG tablet Take 1,000 mg by mouth every 6 (  six) hours as needed for moderate pain.     acidophilus (RISAQUAD) CAPS capsule Take 1 capsule by mouth daily.     amiodarone (PACERONE) 200 MG tablet Take 2 tablets twice daily until 08/28/22, then continue with one tablet daily starting on 08/29/22. 90 tablet 0   apixaban (ELIQUIS) 5 MG TABS tablet Take 5 mg by mouth 2 (two) times daily.     atorvastatin (LIPITOR) 80 MG tablet Take 1 tablet (80 mg total) by mouth daily. 30 tablet 0   buPROPion (WELLBUTRIN SR) 150 MG 12 hr tablet Take 150 mg by mouth 2 (two) times daily.     carvedilol (COREG) 6.25 MG tablet Take 1 tablet (6.25 mg total) by mouth 2 (two) times daily with a meal. 60 tablet 0   clobetasol ointment (TEMOVATE) [id number] % Apply 1 Application topically daily as needed.     empagliflozin (JARDIANCE) 10 MG TABS tablet  Take 1 tablet (10 mg total) by mouth daily. 30 tablet 0   finasteride (PROSCAR) 5 MG tablet Take 5 mg by mouth at bedtime.     gabapentin (NEURONTIN) 300 MG capsule Take 300-600 mg by mouth See admin instructions. Take 600 mg by mouth three times daily and  300 mg at night     ketoconazole (NIZORAL) 2 % shampoo Apply 1 application topically every other day. (Patient not taking: Reported on 08/21/2022)     lidocaine (XYLOCAINE) 2 % solution Use as directed 15 mLs in the mouth or throat as needed for mouth pain. (Patient not taking: Reported on 08/27/2022) 100 mL 0   Menthol-Methyl Salicylate (THERA-GESIC PLUS) CREA Apply 1 application topically at bedtime. Apply to lower back     Multiple Vitamins-Minerals (MULTIVITAMINS THER. W/MINERALS) TABS Take 1 tablet by mouth daily.     nortriptyline (PAMELOR) 10 MG capsule 2 CAPSULES EVERY NIGHT AT BEDTIME (Patient taking differently: Take 20 mg by mouth at bedtime.) 180 capsule 0   saccharomyces boulardii (FLORASTOR) 250 MG capsule Take 1 capsule (250 mg total) by mouth 2 (two) times daily. 30 capsule 0   Semaglutide, 1 MG/DOSE, 4 MG/3ML SOPN Inject 1 mg as directed once a week. (Patient not taking: Reported on 08/21/2022) 3 mL 0   Semaglutide, 2 MG/DOSE, 8 MG/3ML SOPN Inject 2 mg as directed once a week. (Patient not taking: Reported on 08/21/2022) 3 mL 11   sildenafil (VIAGRA) 100 MG tablet Take 100 mg by mouth as needed. (Patient not taking: Reported on 08/27/2022)     torsemide (DEMADEX) 20 MG tablet Take 1 tablet (20 mg total) by mouth daily. 30 tablet 0   No current facility-administered medications on file prior to visit.     Allergies:   Lisinopril, Amlodipine, and Codeine sulfate   Social History   Socioeconomic History   Marital status: Widowed    Spouse name: Not on file   Number of children: 2   Years of education: Not on file   Highest education level: Not on file  Occupational History   Occupation: Retired  Tobacco Use   Smoking status:  Former    Packs/day: 2.00    Years: 33.00    Additional pack years: 0.00    Total pack years: 66.00    Types: Cigarettes    Start date: 08/30/1968    Quit date: 07/02/2001    Years since quitting: 21.1   Smokeless tobacco: Never   Tobacco comments:    quit 12 years ago  Vaping Use   Vaping Use: Never used  Substance and Sexual Activity   Alcohol use: Yes    Comment: seldom    Drug use: No   Sexual activity: Not Currently  Other Topics Concern   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    Social Determinants of Health   Financial Resource Strain: Low Risk  (11/25/2021)   Overall Financial Resource Strain (CARDIA)    Difficulty of Paying Living Expenses: Not hard at all  Food Insecurity: No Food Insecurity (08/21/2022)   Hunger Vital Sign    Worried About Running Out of Food in the Last Year: Never true    Ran Out of Food in the Last Year: Never true  Transportation Needs: No Transportation Needs (08/21/2022)   PRAPARE - Hydrologist (Medical): No    Lack of Transportation (Non-Medical): No  Physical Activity: Sufficiently Active (11/25/2021)   Exercise Vital Sign    Days of Exercise per Week: 4 days    Minutes of Exercise per Session: 60 min  Stress: No Stress Concern Present (11/25/2021)   [place]    Feeling of Stress : Not at all  Social Connections: Unknown (11/25/2021)   Social Connection and Isolation Panel [NHANES]    Frequency of Communication with Friends and Family: Patient declined    Frequency of Social Gatherings with Friends and Family: Patient declined    Attends Religious Services: Patient declined    Marine scientist or Organizations: Patient declined    Attends Archivist Meetings: Patient  declined    Marital Status: Not on file     Family History: [name] family history includes Alzheimer's disease in his mother; Depression in his mother; Diabetes in his mother; Hyperlipidemia in his mother; Other (age of onset: 12) in his father; Pneumonia in his mother; Thyroid disease in his mother. There is no history of Colon cancer or Colon polyps.  ROS:   Please see the history of present illness.     All other systems reviewed and are negative.  EKGs/Labs/Other Studies Reviewed:    The following studies were reviewed today:  EKG:  EKG is  ordered today.  The ekg ordered today demonstrates   None today  Recent Labs: 01/26/2022: Pro B Natriuretic peptide (BNP) 119.0 08/19/2022: ALT 13; B Natriuretic Peptide 200.4; TSH 1.915 08/20/2022: BUN 25; Creatinine, Ser 1.81; Hemoglobin 13.9; Magnesium 2.2; Platelets 206; Potassium 4.0; Sodium 137  Recent Lipid Panel    Component Value Date/Time   CHOL 105 12/04/2021 1200   CHOL 109 10/12/2019 1459   TRIG 172.0 (H) 12/04/2021 1200   TRIG 187 (H) 03/30/2006 0907   HDL 30.20 (L) 12/04/2021 1200   HDL 30 (L) 10/12/2019 1459   CHOLHDL 3 12/04/2021 1200   VLDL 34.4 12/04/2021 1200   LDLCALC 41 12/04/2021 1200   LDLCALC 50 10/12/2019 1459   LDLDIRECT 65.9 09/13/2007 1028     Risk Assessment/Calculations:    CHA2DS2-VASc Score = 5   This indicates a 7.2% annual risk of stroke. [name] score is based upon: CHF History: 1 HTN History: 1 Diabetes History: 1 Stroke History: 0 Vascular Disease History: 0 Age Score: 2 Gender Score: 0       Physical Exam:  VS:   Vitals:   08/31/22 1646  BP: (!) 150/78  Pulse: 72    Wt Readings from Last 3 Encounters:  08/31/22 236 lb (107 kg)  08/20/22 222 lb 6.4 oz (100.9 kg)  07/17/22 239 lb (108.4 kg)     GEN: Obese, Well nourished, well developed in no acute distress HEENT: Normal NECK: No JVD; No carotid bruits LYMPHATICS: No lymphadenopathy CARDIAC: RRR, no  murmurs, rubs, gallops RESPIRATORY:  Clear to auscultation without rales, wheezing or rhonchi  ABDOMEN: Soft, non-tender, non-distended MUSCULOSKELETAL:  No edema; No deformity  SKIN: Warm and dry NEUROLOGIC:  Alert and oriented x 3 PSYCHIATRIC:  Normal affect   ASSESSMENT:    HFpEF: weight stable. No leg edema. He is euvolemic. Continue torsemide 20 mg daily. Continue jardiance 10 mg  daily  pAF: new onset in March 2024. DCCV 08/19/2022 with restoration of sinus rhythm. In NSR. Continue coreg. Continue eliquis 5 mg BID  HLD: lipitor 80 mg daily  HTN: well controlled at home. Continue coreg 6.25 mg BID  Pre-Op: Planned for knee replacement. No active chest pain.He can do >4 METS without significant symptoms. RCRI Class II; 6.0% risk of an adverse cardiac event. He is acceptable cardiac risk for knee replacement PLAN:    In order of problems listed above:  Hepatic function test, TSH Follow up 6 months       Medication Adjustments/Labs and Tests Ordered: Current medicines are reviewed at length with the patient today.  Concerns regarding medicines are outlined above.  Orders Placed This Encounter  Procedures   Hepatic function panel   TSH   No orders of the defined types were placed in this encounter.   Patient Instructions  Medication Instructions:   Your physician recommends that you continue on your current medications as directed. Please refer to the Current Medication list given to you today.  *If you need a refill on your cardiac medications before your next appointment, please call your pharmacy*  Lab Work: Your physician recommends that you return for lab work TOMORROW:  Hepatic (Liver) Function Test  TSH   If you have labs (blood work) drawn today and your tests are completely normal, you will receive your results only by: [place] (if you have MyChart) OR A paper copy in the mail If you have any lab test that is abnormal or we need to change your  treatment, we will call you to review the results.  Testing/Procedures: NONE ordered at this time of appointment   Follow-Up: At [facility], you and your health needs are our priority.  As part of our continuing mission to provide you with exceptional heart care, we have created designated Provider Care Teams.  These Care Teams include your primary Cardiologist (physician) and [name] (APPs -  Physician Assistants and Nurse Practitioners) who all work together to provide you with the care you need, when you need it.  Your next appointment:   6 month(s)  Provider:   Janina Mayo, MD     Other Instructions     Signed, Tanner Mayo, MD  08/31/2022 5:15 PM    [place]

## 2022-09-01 ENCOUNTER — Ambulatory Visit (INDEPENDENT_AMBULATORY_CARE_PROVIDER_SITE_OTHER): Payer: Medicare HMO | Admitting: Internal Medicine

## 2022-09-01 ENCOUNTER — Encounter: Payer: Self-pay | Admitting: Internal Medicine

## 2022-09-01 VITALS — BP 138/80 | HR 67 | Temp 92.6°F | Ht 64.5 in

## 2022-09-01 DIAGNOSIS — I480 Paroxysmal atrial fibrillation: Secondary | ICD-10-CM | POA: Diagnosis not present

## 2022-09-01 DIAGNOSIS — Z86711 Personal history of pulmonary embolism: Secondary | ICD-10-CM

## 2022-09-01 DIAGNOSIS — R6884 Jaw pain: Secondary | ICD-10-CM

## 2022-09-01 DIAGNOSIS — I10 Essential (primary) hypertension: Secondary | ICD-10-CM | POA: Diagnosis not present

## 2022-09-01 DIAGNOSIS — I5022 Chronic systolic (congestive) heart failure: Secondary | ICD-10-CM | POA: Diagnosis not present

## 2022-09-01 DIAGNOSIS — E1169 Type 2 diabetes mellitus with other specified complication: Secondary | ICD-10-CM

## 2022-09-01 LAB — COMPREHENSIVE METABOLIC PANEL
ALT: 16 U/L (ref 0–53)
AST: 21 U/L (ref 0–37)
Albumin: 3.7 g/dL (ref 3.5–5.2)
Alkaline Phosphatase: 83 U/L (ref 39–117)
BUN: 22 mg/dL (ref 6–23)
CO2: 30 mEq/L (ref 19–32)
Calcium: 9.4 mg/dL (ref 8.4–10.5)
Chloride: 98 mEq/L (ref 96–112)
Creatinine, Ser: 1.76 mg/dL — ABNORMAL HIGH (ref 0.40–1.50)
GFR: 36.75 mL/min — ABNORMAL LOW (ref >60.00)
Glucose, Bld: 71 mg/dL (ref 70–99)
Potassium: 3.7 mEq/L (ref 3.5–5.1)
Sodium: 141 mEq/L (ref 135–145)
Total Bilirubin: 0.8 mg/dL (ref 0.2–1.2)
Total Protein: 6.3 g/dL (ref 6.0–8.3)

## 2022-09-01 LAB — CBC
HCT: 41.4 % (ref 39.0–52.0)
Hemoglobin: 13.9 g/dL (ref 13.0–17.0)
MCHC: 33.5 g/dL (ref 30.0–36.0)
MCV: 91.1 fl (ref 78.0–100.0)
Platelets: 237 10*3/uL (ref 150.0–400.0)
RBC: 4.55 Mil/uL (ref 4.22–5.81)
RDW: 18 % — ABNORMAL HIGH (ref 11.5–15.5)
WBC: 8.8 10*3/uL (ref 4.0–10.5)

## 2022-09-01 LAB — TSH: TSH: 5.53 u[IU]/mL — ABNORMAL HIGH (ref 0.35–5.50)

## 2022-09-01 MED ORDER — EMPAGLIFLOZIN 10 MG PO TABS: 10.0000 mg | ORAL_TABLET | Freq: Every day | ORAL | 3 refills | Status: DC

## 2022-09-01 NOTE — Progress Notes (Signed)
   Subjective:   Patient ID: Tanner [name], male    DOB: 02/16/1946, 77 y.o.   MRN: [id number]  HPI The patient is a 77 YO man coming in for hospital follow up (in for SOB and A fib with cardioversion and some diuresis). Weight stable since leaving hospital. Saw cardiology yesterday.   PMH, [facility], social history reviewed and updated  Review of Systems  Constitutional:  Positive for activity change and fatigue.  HENT: Negative.    Eyes: Negative.   Respiratory:  Negative for cough, chest tightness and shortness of breath.   Cardiovascular:  Negative for chest pain, palpitations and leg swelling.  Gastrointestinal:  Negative for abdominal distention, abdominal pain, constipation, diarrhea, nausea and vomiting.  Musculoskeletal:  Positive for arthralgias and myalgias.  Skin: Negative.   Neurological: Negative.   Psychiatric/Behavioral: Negative.      Objective:  Physical Exam Constitutional:      Appearance: He is well-developed. He is obese.  HENT:     Head: Normocephalic and atraumatic.  Cardiovascular:     Rate and Rhythm: Normal rate and regular rhythm.  Pulmonary:     Effort: Pulmonary effort is normal. No respiratory distress.     Breath sounds: Normal breath sounds. No wheezing or rales.  Abdominal:     General: Bowel sounds are normal. There is no distension.     Palpations: Abdomen is soft.     Tenderness: There is no abdominal tenderness. There is no rebound.  Musculoskeletal:        General: Tenderness present.     Cervical back: Normal range of motion.  Skin:    General: Skin is warm and dry.  Neurological:     Mental Status: He is alert and oriented to person, place, and time.     Coordination: Coordination normal.     Vitals:   09/01/22 1057  BP: 138/80  Pulse: 67  Temp: (!) 92.6 F (33.7 C)  TempSrc: Oral  SpO2: 97%  Height: 5' 4.5" (1.638 m)    Assessment & Plan:  Visit time 20 minutes in face to face communication with patient and coordination  of care, additional 10 minutes spent in record review, coordination or care, ordering tests, communicating/referring to other healthcare professionals, documenting in medical records all on the same day of the visit for total time 30 minutes spent on the visit.

## 2022-09-01 NOTE — Assessment & Plan Note (Signed)
Resolved after extended course of prednisone.

## 2022-09-01 NOTE — Assessment & Plan Note (Signed)
Checking TSH and hepatic function and will forward results to his cardiologist. Added renal function due to elevated levels on discharge which need recheck.

## 2022-09-01 NOTE — Assessment & Plan Note (Signed)
With reduction of renal function with diuresis inpatient. Checking CMP today to assess stability. BP is at goal and will adjust torsemide 20 mg daily and coreg 6.25 mg BID as needed based on results.

## 2022-09-01 NOTE — Assessment & Plan Note (Signed)
Continue eliquis and checking CMP today as creatinine elevated at end of hospital stay.

## 2022-09-01 NOTE — Assessment & Plan Note (Signed)
No flare today and weight is stable on home torsemide dosing 20 mg daily. Is on coreg 6.25 mg BID and jardiance 10 mg daily. He did not understand that jardiance was helping heart failure and explained this to him today. Refilled today.

## 2022-09-01 NOTE — Patient Instructions (Signed)
We will check the labs and forward them to the heart doctor.

## 2022-09-01 NOTE — Assessment & Plan Note (Signed)
Taking ozempic and jardiance and will continue. HgA1c at goal recently checked. Continue same.

## 2022-09-02 ENCOUNTER — Other Ambulatory Visit: Payer: Self-pay | Admitting: Internal Medicine

## 2022-09-02 ENCOUNTER — Ambulatory Visit: Payer: Self-pay | Admitting: Licensed Clinical Social Worker

## 2022-09-02 NOTE — Patient Outreach (Signed)
  Care Coordination  Initial Visit Note   09/02/2022 Name: Tanner [name] MRN: [id number] DOB: [birth year]  Tanner [name] is a 77 y.o. year old male who sees Hoyt Koch, MD for primary care. I spoke with  Lizabeth Leyden by phone today.  What matters to the patients health and wellness today?  Managing his health and decreasing loneness  Patient report loneness with wife passing in 2020. He is currently active with church activities , grief support group, and [employer] brothers. Reports he wants to find something to do during the day. He may benefit from and willing to explore volunteer opportunities    Goals Addressed             This [place] opportunies to decrease loneness and getting out of the house       Activities and task to complete in order to accomplish goals.   Call 211 and [place] to explore volunteer opportunities   Per your request all information has been e-mailed to you see copy of information below.  Please completed the Forensic scientist with [employer] this was e-mailed to you [employer] [facility] [place] Attn: Renato Battles, Volunteer Coordinator [address] [place], [place] [address]  For more information, contact: Renato Battles Volunteer Coordinator [phone] extension [phone] [email or web address]  [email or web address]  website if you want more information    Other option is 211 [place] 211 is an information and referral service provided by [employer] [facility] [place].  Families and individuals can call 2-1-1 or (606)[phone] to receive free and confidential information on health and human services within their community. PokerAddress.es           SDOH assessments and interventions completed:  Yes  SDOH Interventions Today    Flowsheet Row Most Recent Value  SDOH Interventions   Stress Interventions  Provide Counseling  Social Connections Interventions Intervention Not Indicated      Care Coordination Interventions:  Yes, provided  Interventions Today    Flowsheet Row Most Recent Value  Chronic Disease   Chronic disease during today's visit Hypertension (HTN), Congestive Heart Failure (CHF), Diabetes, Chronic Kidney Disease/End Stage Renal Disease (ESRD)  General Interventions   General Interventions Discussed/Reviewed General Interventions Discussed, Community Resources, Level of Care  Level of Care Assisted Living  [not interested at this time]  Education Interventions   Education Provided Provided Education, Provided Web-based Education  Provided Verbal Education On Principal Financial opportunity reviewed with [place] Discussed/Reviewed Coping Strategies, Grief and Loss       Follow up plan: Follow up call scheduled for 09/14/22     Encounter Outcome:  Pt. Visit Completed   Casimer Lanius, [place] [phone]

## 2022-09-02 NOTE — Patient Instructions (Signed)
Visit Information  Thank you for taking time to visit with me today. Please don't hesitate to contact me if I can be of assistance to you.   Following are the goals we discussed today:   Goals Addressed             This Visit's Progress    Find Volunteer opportunies to decrease loneness and getting out of the house       Activities and task to complete in order to accomplish goals.   Call 211 and [place] to explore volunteer opportunities   Per your request all information has been e-mailed to you see copy of information below.  Please completed the Forensic scientist with [employer] this was e-mailed to you [employer] [place] Attn: Renato Battles, Volunteer Coordinator [address] [place], [place] [address]  For more information, contact: Renato Battles Volunteer Coordinator [phone] extension [phone] [email or web address]  [email or web address]  website if you want more information    Other option is 211 [place] 211 is an information and referral service provided by [employer] [facility] [place].  Families and individuals can call 2-1-1 or 562-115-8820 to receive free and confidential information on health and human services within their community. PokerAddress.es           Our next appointment is by telephone on 09/14/22 at 2:30  Please call the care guide team at [phone] if you need to cancel or reschedule your appointment.   If you are experiencing a Mental Health or [place] or need someone to talk to, please call 1-800-273-TALK (toll free, 24 hour hotline)   Patient verbalizes understanding of instructions and care plan provided today and agrees to view in [place]. Active MyChart status and patient understanding of how to access instructions and care plan via MyChart confirmed with patient.     Casimer Lanius, [place] [phone]

## 2022-09-03 NOTE — Telephone Encounter (Signed)
Pls advise on dosage.Marland KitchenJohny Chess

## 2022-09-03 NOTE — Telephone Encounter (Signed)
Call patient to verify ozempic dose he is taking, how long taking that dose. He should have followed sig and taken 0.25 mg weekly 4 weeks then 0.5 mg weekly 5 weeks then 1 mg weekly 4 weeks then 2 mg weekly 4 weeks then follow up

## 2022-09-07 ENCOUNTER — Ambulatory Visit: Payer: Self-pay

## 2022-09-07 NOTE — Patient Instructions (Signed)
Visit Information  Thank you for taking time to visit with me today. Please don't hesitate to contact me if I can be of assistance to you.   Following are the goals we discussed today:   Goals Addressed             This Visit's Progress    continue to improve post hospitalization       Interventions Today    Flowsheet Row Most Recent Value  Chronic Disease   Chronic disease during today's visit Diabetes, Congestive Heart Failure (CHF), Atrial Fibrillation (AFib)  General Interventions   General Interventions Discussed/Reviewed General Interventions Reviewed, Doctor Visits  Doctor Visits Discussed/Reviewed Doctor Visits Reviewed, PCP, Specialist  PCP/Specialist Visits Compliance with follow-up visit  Education Interventions   Education Provided Provided Education  Provided Verbal Education On Other, Medication, Nutrition  [medications reviewed. discussed signs/symptoms of HF exacerbation/a-fib exacerbation and when to call the doctor.]  Nutrition Interventions   Nutrition Discussed/Reviewed Nutrition Reviewed, Decreasing salt, Fluid intake  [advised patient when eating out at restaraunt to ask for sodium to be cut by 1/2 when preparing meal. advised keep fluid <2L/day.]  Pharmacy Interventions   Pharmacy Dicussed/Reviewed Pharmacy Topics Reviewed            Our next appointment is by telephone on 10/06/22 at 1:30 pm.  Please call the care guide team at [phone] if you need to cancel or reschedule your appointment.   If you are experiencing a Mental Health or [place] or need someone to talk to, please call the Suicide and Crisis Lifeline: [place], RN, MSN, BSN, [place] [phone]

## 2022-09-07 NOTE — Patient Outreach (Signed)
  Care Coordination   Follow Up Visit Note   09/07/2022 Name: Tanner [name] MRN: [id number] DOB: 08/01/1945  Tanner [name] is a 77 y.o. year old male who sees Tanner Koch, MD for primary care. I spoke with  Tanner [name] by phone today.  What matters to the patients health and wellness today?  Tanner [name] reports he has been to follow up visit with PCP and cardiologist. He states he is taking medications as prescribed. He states he has been eating well since discharge from the hospital eating out a lot with family and friends. He reports he is in the process of trying to loose some weight in order to have future knee surgery.    Goals Addressed             This Visit's Progress    continue to improve post hospitalization       Interventions Today    Flowsheet Row Most Recent Value  Chronic Disease   Chronic disease during today's visit Diabetes, Congestive Heart Failure (CHF), Atrial Fibrillation (AFib)  General Interventions   General Interventions Discussed/Reviewed General Interventions Reviewed, Doctor Visits  Doctor Visits Discussed/Reviewed Doctor Visits Reviewed, PCP, Specialist  PCP/Specialist Visits Compliance with follow-up visit  Education Interventions   Education Provided Provided Education  Provided Verbal Education On Other, Medication, Nutrition  [medications reviewed. discussed signs/symptoms of HF exacerbation/a-fib exacerbation and when to call the doctor.]  Nutrition Interventions   Nutrition Discussed/Reviewed Nutrition Reviewed, Decreasing salt, Fluid intake  [advised patient when eating out at restaraunt to ask for sodium to be cut by 1/2 when preparing meal. advised keep fluid <2L/day.]  Pharmacy Interventions   Pharmacy Dicussed/Reviewed Pharmacy Topics Reviewed            SDOH assessments and interventions completed:  No  Care Coordination Interventions:  Yes, provided   Follow up plan: Follow up call scheduled for 10/06/22    Encounter  Outcome:  Pt. Visit Completed   Tanner Silversmith, RN, MSN, BSN, [place] Coordinator [phone]

## 2022-09-10 ENCOUNTER — Telehealth: Payer: Self-pay | Admitting: Internal Medicine

## 2022-09-10 NOTE — Telephone Encounter (Signed)
Prescription Request  09/10/2022  LOV: 09/01/2022  What is the name of the medication or equipment? Jardiance, carvedilol, atorvastatin, amiodorone  Have you contacted your pharmacy to request a refill? No   Which pharmacy would you like this sent to?  [id number] [address], [place] [address] Phone: [phone] Fax: [phone]   Patient notified that their request is being sent to the clinical staff for review and that they should receive a response within 2 business days.   Please advise at Mobile [phone] (mobile)

## 2022-09-11 ENCOUNTER — Inpatient Hospital Stay
Admission: RE | Admit: 2022-09-11 | Discharge: 2022-09-11 | Disposition: A | Discharge status: Home / Self Care | Payer: Commercial Managed Care - PPO | Source: Ambulatory Visit | Attending: Family Medicine | Admitting: Family Medicine

## 2022-09-11 ENCOUNTER — Other Ambulatory Visit: Payer: Self-pay

## 2022-09-11 DIAGNOSIS — M4802 Spinal stenosis, cervical region: Secondary | ICD-10-CM

## 2022-09-11 DIAGNOSIS — M542 Cervicalgia: Secondary | ICD-10-CM | POA: Insufficient documentation

## 2022-09-11 DIAGNOSIS — M47892 Other spondylosis, cervical region: Secondary | ICD-10-CM

## 2022-09-11 DIAGNOSIS — M5030 Other cervical disc degeneration, unspecified cervical region: Secondary | ICD-10-CM | POA: Insufficient documentation

## 2022-09-14 ENCOUNTER — Ambulatory Visit: Payer: Self-pay | Admitting: Licensed Clinical Social Worker

## 2022-09-14 ENCOUNTER — Encounter (HOSPITAL_COMMUNITY): Payer: Self-pay | Admitting: Radiology

## 2022-09-14 ENCOUNTER — Other Ambulatory Visit: Payer: Self-pay

## 2022-09-14 MED ORDER — EMPAGLIFLOZIN 10 MG PO TABS: 10.0000 mg | ORAL_TABLET | Freq: Every day | ORAL | 3 refills | Status: DC

## 2022-09-14 NOTE — Patient Instructions (Signed)
  It was a pleasure speaking with you today. I am sorry you were unable to keep your phone appointment today.   Per your request a Care Coordination phone appointment is scheduled 09/17/22  Casimer Lanius, [place] Work Care Coordination  [phone]

## 2022-09-14 NOTE — Telephone Encounter (Signed)
Was able to reorder jardiance since Dr Sharlet Salina prescribed this

## 2022-09-14 NOTE — Patient Outreach (Signed)
  Care Coordination  Follow Up Visit Note   09/14/2022 Name: ENSO YOKE MRN: [id number] DOB: Jan 24, 1946  CORDELLE CHOUDHARY is a 77 y.o. year old male who sees Hoyt Koch, MD for primary care. I spoke with  Lizabeth Leyden by phone today.  What matters to the patients health and wellness today?  Unable to keep appointment today would like to reschedule.   SDOH assessments and interventions completed:  No   Care Coordination Interventions:  No, not indicated   Follow up plan: Follow up call scheduled for 09/17/22    Encounter Outcome:  Pt. Visit Completed   Casimer Lanius, [place] [phone]

## 2022-09-15 ENCOUNTER — Other Ambulatory Visit (INDEPENDENT_AMBULATORY_CARE_PROVIDER_SITE_OTHER): Payer: Self-pay | Admitting: Family Medicine

## 2022-09-15 ENCOUNTER — Telehealth (INDEPENDENT_AMBULATORY_CARE_PROVIDER_SITE_OTHER): Payer: Self-pay | Admitting: Family Medicine

## 2022-09-15 ENCOUNTER — Encounter (INDEPENDENT_AMBULATORY_CARE_PROVIDER_SITE_OTHER): Payer: Self-pay

## 2022-09-15 ENCOUNTER — Other Ambulatory Visit (INDEPENDENT_AMBULATORY_CARE_PROVIDER_SITE_OTHER): Payer: Self-pay | Admitting: Physician Assistant

## 2022-09-15 DIAGNOSIS — M542 Cervicalgia: Secondary | ICD-10-CM

## 2022-09-15 DIAGNOSIS — M5030 Other cervical disc degeneration, unspecified cervical region: Secondary | ICD-10-CM

## 2022-09-15 NOTE — Telephone Encounter (Signed)
Last Visit:08/12/2022     Upcoming appointments: 12/02/2022           Marinell Blight, MA  09/15/2022, 15:34

## 2022-09-15 NOTE — Telephone Encounter (Signed)
LM TO R/C, MYC SENT

## 2022-09-15 NOTE — Telephone Encounter (Signed)
-----   Message from Trenton Gammon, DO sent at 09/15/2022  1:46 PM EDT -----  Patient needs referred to the spine center at [facility] regarding significant arthritis changes and disc disease in his cervical spine, especially at C3-4 and C5-6 where he does have some severe narrowing.  Referral done

## 2022-09-17 ENCOUNTER — Ambulatory Visit: Payer: Self-pay | Admitting: Licensed Clinical Social Worker

## 2022-09-17 ENCOUNTER — Other Ambulatory Visit (HOSPITAL_COMMUNITY): Payer: Self-pay

## 2022-09-17 ENCOUNTER — Telehealth: Payer: Self-pay | Admitting: Internal Medicine

## 2022-09-17 MED ORDER — CARVEDILOL 6.25 MG PO TABS: 6.2500 mg | ORAL_TABLET | Freq: Two times a day (BID) | ORAL | 1 refills | Status: DC

## 2022-09-17 MED ORDER — TORSEMIDE 20 MG PO TABS: 20.0000 mg | ORAL_TABLET | Freq: Every day | ORAL | 1 refills | Status: DC

## 2022-09-17 MED ORDER — ATORVASTATIN CALCIUM 80 MG PO TABS: 80.0000 mg | ORAL_TABLET | Freq: Every day | ORAL | 5 refills | Status: DC

## 2022-09-17 NOTE — Telephone Encounter (Signed)
Prescription Request  09/17/2022  LOV: 09/01/2022  What is the name of the medication or equipment? atorvastatin (LIPITOR) 80 MG tablet  carvedilol (COREG) 6.25 MG tablet  torsemide (DEMADEX) 20 MG tablet  Have you contacted your pharmacy to request a refill? Yes   Which pharmacy would you like this sent to?  [id number] [address], [address] [place] [address] Phone: [phone] Fax: [phone]    Patient notified that their request is being sent to the clinical staff for review and that they should receive a response within 2 business days.   Please advise at Mobile [phone] (mobile)

## 2022-09-17 NOTE — Telephone Encounter (Signed)
Sent refill to [facility].../lmb 

## 2022-09-17 NOTE — Patient Instructions (Signed)
Visit Information  Thank you for taking time to visit with me today. Please don't hesitate to contact me if I can be of assistance to you.   Following are the goals we discussed today:   Goals Addressed             This Visit's Progress    Find Volunteer opportunies to decrease loneness and getting out of the house       Activities and task to complete in order to accomplish goals.   Call 211 and [place] to explore volunteer opportunities   Per your request all information has been e-mailed to you see copy of information below in addition I have also sent a request to the volunteer coordinator to contact to explain their volunteer opportunities  Please completed the Forensic scientist with [employer] this was e-mailed to you [employer] [facility] [place] Attn: Renato Battles, Volunteer Coordinator [address] [place], [place] [address]  For more information, contact: Renato Battles Volunteer Coordinator [phone] extension [phone] [email or web address]  [email or web address]  website if you want more information    Other option is 211 [place] 211 is an information and referral service provided by [employer] [facility] [place].  Families and individuals can call 2-1-1 or 865-147-9363 to receive free and confidential information on health and human services within their community. PokerAddress.es            Our next appointment is by telephone on 09/30/22 at 1:15  Please call the care guide team at [phone] if you need to cancel or reschedule your appointment.   If you are experiencing a Mental Health or [place] or need someone to talk to, please  call the Suicide and Crisis Lifeline: 988 call the [place] National Suicide Prevention Lifeline: 651-527-3065 or TTY: 215-578-6122 TTY (317)557-9854) to talk to a trained counselor call 1-800-273-TALK (toll  free, 24 hour hotline) go to [facility] [address] [place] [id number])   Patient verbalizes understanding of instructions and care plan provided today and agrees to view in [place]. Active MyChart status and patient understanding of how to access instructions and care plan via MyChart confirmed with patient.      Casimer Lanius, [place] [phone]

## 2022-09-17 NOTE — Patient Outreach (Signed)
  Care Coordination  Follow Up Visit Note   09/17/2022 Name: Tanner [name] MRN: [id number] DOB: 09-16-1945  Tanner [name] is a 77 y.o. year old male who sees Hoyt Koch, MD for primary care. I spoke with  Tanner [name] by phone today.  What matters to the patients health and wellness today?  He wants to get out of the house more and find something to do.    Goals Addressed             This Visit's Progress    Find Volunteer opportunies to decrease loneness and getting out of the house       Activities and task to complete in order to accomplish goals.   Call 211 and [place] to explore volunteer opportunities   Per your request all information has been e-mailed to you see copy of information below in addition I have also sent a request to the volunteer coordinator to contact to explain their volunteer opportunities  Please completed the Forensic scientist with [employer] this was e-mailed to you [employer] [facility] [place] Attn: Renato Battles, Volunteer Coordinator [address] [place], [place] [address]  For more information, contact: Renato Battles Volunteer Coordinator [phone] extension 243 [email or web address]  [email or web address]  website if you want more information    Other option is 211 [place] 211 is an information and referral service provided by [employer] [place].  Families and individuals can call 2-1-1 or [phone])192-9985 to receive free and confidential information on health and human services within their community. PokerAddress.es           SDOH assessments and interventions completed:  No  Care Coordination Interventions:  Yes, provided  Interventions Today    Flowsheet Row Most Recent Value  Chronic Disease   Chronic disease during today's visit Hypertension (HTN), Congestive Heart Failure (CHF), Diabetes  General  Interventions   General Interventions Discussed/Reviewed General Interventions Reviewed, [employer], Communication with  Communication with --  Biochemist, clinical at [place] Discussed/Reviewed [place] Reviewed, Coping Strategies, Anxiety, Depression  [solution focus, task centered, emotional support, active listening]       Follow up plan:  Referral made to [employer] for volunteer Opportunities  Follow up call scheduled for 09/30/22    Encounter Outcome:  Pt. Visit Completed   Tanner [name], Tanner [name] [phone]

## 2022-09-17 NOTE — Telephone Encounter (Signed)
Pt aware,ok to see them

## 2022-09-17 NOTE — Telephone Encounter (Signed)
Regarding: call back requested  ----- Message from Reginia Naas Postelwait sent at 09/17/2022 11:26 AM EDT -----  Rhoderick Moody, DO    Pt is calling in regards to MRI results. Please call    Thank you and have a nice day,    [facility]

## 2022-09-17 NOTE — Telephone Encounter (Signed)
Internal referral they will call patient to schedule

## 2022-09-22 ENCOUNTER — Other Ambulatory Visit: Payer: Self-pay

## 2022-09-22 ENCOUNTER — Ambulatory Visit: Payer: Commercial Managed Care - PPO | Attending: Physician Assistant | Admitting: Physician Assistant

## 2022-09-22 ENCOUNTER — Encounter (INDEPENDENT_AMBULATORY_CARE_PROVIDER_SITE_OTHER): Payer: Self-pay | Admitting: Physician Assistant

## 2022-09-22 VITALS — Resp 18 | Ht 71.0 in | Wt 233.0 lb

## 2022-09-22 DIAGNOSIS — Z87891 Personal history of nicotine dependence: Secondary | ICD-10-CM

## 2022-09-22 DIAGNOSIS — N401 Enlarged prostate with lower urinary tract symptoms: Secondary | ICD-10-CM | POA: Insufficient documentation

## 2022-09-22 DIAGNOSIS — R361 Hematospermia: Secondary | ICD-10-CM

## 2022-09-22 DIAGNOSIS — N138 Other obstructive and reflux uropathy: Secondary | ICD-10-CM | POA: Insufficient documentation

## 2022-09-22 DIAGNOSIS — R3912 Poor urinary stream: Secondary | ICD-10-CM

## 2022-09-22 DIAGNOSIS — R39198 Other difficulties with micturition: Secondary | ICD-10-CM

## 2022-09-22 NOTE — Progress Notes (Signed)
[facility] MEDICINE  [place] [facility] - FOLLOWUP    PATIENT NAME :Thomas [name]   DATE OF SERVICE: 09/22/2022  SERVICE LOCATION: [facility]      Thomas [name] is a 77 y.o. male who presents for followup of weakened stream and hesitancy with hematospermia.    Patient has a hx of BPH in which he is on afluzosin and now on Proscar. He reports that he continued to have a weakened stream and intermittency which is bothersome for him. He previously was having infrequent blood in his ejaculate. He denies recurrence. He did not complete cytology. He denies family history of GU cancer. He is an infrequent smoker quit around age 14.       Past Medical History  Past Medical History:   Diagnosis Date    Arthritis     Dysuria     Enteritis due to Rotavirus     Gout     Headache     Hemorrhoid     Hypercholesterolemia     Hypertension            Past Surgical History  Past Surgical History:   Procedure Laterality Date    HX HEMORRHOIDECTOMY             Allergies  No Known Allergies      Medications  acetaminophen (TYLENOL) 500 mg Oral Tablet, Take 1 Tablet (500 mg total) by mouth Every 4 hours as needed for Pain (pt takes tylenol pm)  alfuzosin (UROXATRAL) 10 mg Oral Tablet Sustained Release 24 hr, TAKE ONE TABLET BY MOUTH EVERY DAY  apixaban (ELIQUIS) 5 mg Oral Tablet, Take 1 Tablet (5 mg total) by mouth Twice daily SHIP TO PATIENT,   [id number]  STATE LICENSE- [id number]  NPI- [id number]  diltiazem HCl (TIAZAC) 240 mg Oral Capsule,Sustained Action 24 hr, TAKE ONE CAPSULE BY MOUTH DAILY  finasteride (PROSCAR) 5 mg Oral Tablet, TAKE ONE TABLET BY MOUTH EVERY DAY  flecainide (TAMBOCOR) 100 mg Oral Tablet, TAKE ONE TABLET BY MOUTH TWICE DAILY  furosemide (LASIX) 40 mg Oral Tablet, TAKE ONE TABLET BY MOUTH EVERY DAY  losartan (COZAAR) 50 mg Oral Tablet, Take 1 Tablet (50 mg total) by mouth Once a day  nitroGLYCERIN (NITROSTAT) 0.4 mg Sublingual Tablet, Sublingual, Place 1 Tablet (0.4 mg total) under the  tongue Every 5 minutes as needed for Chest pain  potassium chloride (KLOR-CON) 10 mEq Oral Tablet Sustained Release, TAKE ONE TABLET BY MOUTH EVERY DAY  rosuvastatin (CRESTOR) 10 mg Oral Tablet, Take 1 Tablet (10 mg total) by mouth Every evening  SPIRIVA WITH HANDIHALER 18 mcg Inhalation Capsule, w/Inhalation Device, Take 1 Capsule (18 mcg total) by inhalation Once a day  tamsulosin (FLOMAX) 0.4 mg Oral Capsule, Take 1 Capsule (0.4 mg total) by mouth Every evening after dinner    No facility-administered medications prior to visit.      OBJECTIVE:  Resp 18   Ht 1.803 m (5\' 11" )   Wt 106 kg (233 lb)   BMI 32.50 kg/m       General: No apparent distress, well appearing  HEENT:  NCAT, EOMI, OP clear  Neck:  supple, trachea midline  RESP:  Nonlabored breathing, no use of accessory muscles   CV: No extremity swelling  Neuro:  Neurological exam is consistent with patients age.     DRE: completed last office visit  Psych: alert, appropriate mood, and in no acute distress.      MSK: Ambulates without assistance  Office Testing/Procedures:  There are no exam notes on file for this visit.       PSA:  05/2016 - 1.30  06/2017 - 2.60  04/2021 - 2.22  04/2022 - 1.66    I personally reviewed the following:    Labs - none  Imaging - none  Records - pt previous office records      ASSESSMENT:   Encounter Diagnoses   Name Primary?    Benign prostatic hyperplasia with urinary obstruction Yes    Hematospermia            PLAN: 77 year old male with history of BPH w/ LUTS and hematospermia    PVR previously low  Continue afluzosin and finasteride  Will plan cystoscopy for further evaluation to r/o BOO; consider surgical management prn  PSA stable as of 04/2022  Recommended limiting prostate irritants to help with urinary sx  Medication management: Flomax d/c, alfuzosin, proscar  All questions and concerns were addressed during this visit.       Orders Placed This Encounter    cystoscopy Prior Auth/Scheduling Procedure     Return  for Next Available cystoscopy.      Chong Sicilian, PA-C  [facility]

## 2022-09-24 ENCOUNTER — Ambulatory Visit (INDEPENDENT_AMBULATORY_CARE_PROVIDER_SITE_OTHER): Payer: Self-pay | Admitting: Family Medicine

## 2022-09-24 NOTE — Telephone Encounter (Signed)
Regarding: referral  ----- Message from Marko Stai sent at 09/24/2022 12:47 PM EDT -----  Thomas Moody, DO'    Pt is following up on a referral for spine center he states he hasn't heard anything yet ,

## 2022-09-24 NOTE — Telephone Encounter (Signed)
Gave pt their phone number to schedule

## 2022-09-28 ENCOUNTER — Other Ambulatory Visit: Payer: Self-pay

## 2022-09-28 ENCOUNTER — Encounter (HOSPITAL_COMMUNITY): Payer: Self-pay | Admitting: ORTHOPAEDIC SURGERY

## 2022-09-28 ENCOUNTER — Ambulatory Visit: Payer: Commercial Managed Care - PPO | Attending: ORTHOPAEDIC SURGERY | Admitting: ORTHOPAEDIC SURGERY

## 2022-09-28 VITALS — Ht 71.0 in | Wt 233.0 lb

## 2022-09-28 DIAGNOSIS — M5030 Other cervical disc degeneration, unspecified cervical region: Secondary | ICD-10-CM | POA: Insufficient documentation

## 2022-09-28 DIAGNOSIS — M4802 Spinal stenosis, cervical region: Secondary | ICD-10-CM | POA: Insufficient documentation

## 2022-09-28 DIAGNOSIS — M542 Cervicalgia: Secondary | ICD-10-CM

## 2022-09-28 NOTE — Patient Instructions (Addendum)
CT- Our office will authorize. facility will call to schedule. [facility] Scheduling [phone]  Any imaging outside of network please obtain disc and bring to follow up appointment.   Any Questions please call the office at [phone].   Let us know once the CT is done and we will call you back with the results

## 2022-09-28 NOTE — Progress Notes (Signed)
[facility]  Spine Center  Clinic Note     Name: Thomas [name] MRN:  [id number]   Date: 09/28/2022 Age: 77 y.o.       HPI:  Thomas [name] is a 77 y.o. male presents with about a 1 year history of right-sided neck pain.  It began without any trauma.  He is not having any arm symptoms or leg symptoms.  No bowel or bladder dysfunction.  He has not been clumsy.  He has done physical therapy from October to December and then subsequently chiropractic from January to February and feels the chiropractic visits helped him the most.  The pain is relatively sharp in is associated with turning his head particularly to the left.  He would characterize the pain as about a 2 at its best to a 6 at its worst at this point when a couple of months ago it was a 7 or 8 at all times.  He has had no injections in his neck.  He has had an MRI scan done.      Past Medical History:  Past Medical History:   Diagnosis Date    Arthritis     Dysuria     Enteritis due to Rotavirus     Gout     Headache     Hemorrhoid     Hypercholesterolemia     Hypertension            Past Surgical History:  Past Surgical History:   Procedure Laterality Date    HX HEMORRHOIDECTOMY             Medications:  Current Outpatient Medications   Medication Sig    acetaminophen (TYLENOL) 500 mg Oral Tablet Take 1 Tablet (500 mg total) by mouth Every 4 hours as needed for Pain (pt takes tylenol pm)    alfuzosin (UROXATRAL) 10 mg Oral Tablet Sustained Release 24 hr TAKE ONE TABLET BY MOUTH EVERY DAY    apixaban (ELIQUIS) 5 mg Oral Tablet Take 1 Tablet (5 mg total) by mouth Twice daily SHIP TO PATIENT,   [id number]  STATE LICENSE- [id number] L  NPI- [id number]    diltiazem HCl (TIAZAC) 240 mg Oral Capsule,Sustained Action 24 hr TAKE ONE CAPSULE BY MOUTH DAILY    finasteride (PROSCAR) 5 mg Oral Tablet TAKE ONE TABLET BY MOUTH EVERY DAY    flecainide (TAMBOCOR) 100 mg Oral Tablet TAKE ONE TABLET BY MOUTH TWICE DAILY    furosemide (LASIX) 40 mg Oral Tablet TAKE ONE  TABLET BY MOUTH EVERY DAY    losartan (COZAAR) 50 mg Oral Tablet Take 1 Tablet (50 mg total) by mouth Once a day    nitroGLYCERIN (NITROSTAT) 0.4 mg Sublingual Tablet, Sublingual Place 1 Tablet (0.4 mg total) under the tongue Every 5 minutes as needed for Chest pain    potassium chloride (KLOR-CON) 10 mEq Oral Tablet Sustained Release TAKE ONE TABLET BY MOUTH EVERY DAY    rosuvastatin (CRESTOR) 10 mg Oral Tablet Take 1 Tablet (10 mg total) by mouth Every evening    SPIRIVA WITH HANDIHALER 18 mcg Inhalation Capsule, w/Inhalation Device Take 1 Capsule (18 mcg total) by inhalation Once a day        Allergies:  No Known Allergies     Social History:  Social History     Socioeconomic History    Marital status: Widowed   Tobacco Use    Smoking status: Former     Types: Cigars    Smokeless tobacco: Former  Types: Chew, Snuff   Vaping Use    Vaping status: Never Used   Substance and Sexual Activity    Alcohol use: Yes     Comment: occasional    Drug use: Never    Sexual activity: Not Currently     Social Determinants of Health     Financial Resource Strain: Low Risk  (07/22/2022)    Financial Resource Strain     SDOH Financial: No   Transportation Needs: Low Risk  (07/22/2022)    Transportation Needs     SDOH Transportation: No   Social Connections: Low Risk  (07/22/2022)    Social Connections     SDOH Social Isolation: 5 or more times a week   Intimate Partner Violence: Low Risk  (07/22/2022)    Intimate Partner Violence     SDOH Domestic Violence: I have not had a partner in the past year.   Housing Stability: Low Risk  (07/22/2022)    Housing Stability     SDOH Housing Situation: I have housing.     SDOH Housing Worry: No        Family History:  Family Medical History:       Problem Relation (Age of Onset)    Cancer Mother    Hypertension (High Blood Pressure) Mother, Father                 General Physical Exam:  Ht 1.803 m (5\' 11" )   Wt 106 kg (233 lb)   BMI 32.50 kg/m     Healthy-appearing pleasant gentleman no acute  distress.  Alert and oriented x3.    Physical Exam:    Nontender to palpation cervical spine.    Cervical range of motion is full.    Neurological Examination:    Mental Status: Alert and oriented x3. Normal speech without aphasia or dysarthria. Normal attention. Normal concentration. Normal memory and normal fund of knowledge.     Gait: Gait and station are normal.     Right Upper Extremity:   Motor: 5/5 in all major muscle groups.   Sensory: Intact to light touch.   Reflexes: 2 in the extremity. Hoffman's signs are negative.  Left Upper Extremity:   Motor: 5/5 in all major muscle groups.   Sensory: Intact to light touch.   Reflexes: 2 in the extremity. Hoffman's signs are negative.      Pulses:  2+ radial pulses bilaterally.    Bilateral painless shoulder range of motion.    Diagnostic Studies:  MRI cervical spine shows severe facet disease at C2-3 into a lesser extent at C4-5 on the right side consistent with his symptoms.  Multilevel foraminal stenosis but no central stenosis.    I personally reviewed the patient's films in the office today.     Assessment/Plan:  ENCOUNTER DIAGNOSES     ICD-10-CM   1. Neck pain  M54.2   2. Posterior neck pain  M54.2   3. Degenerative disc disease, cervical  M50.30   4. Cervical stenosis of spinal canal  M48.02       Patient's symptoms are most likely coming from his facet disease in the right side of his cervical spine.  In order to better evaluate this I will get a CT scan.  I will call him after we review that and likely assuming the CT scan also shows significant facet disease, we will send him for facet blocks.  No current surgical plans.    Thank you for your kind referral.  With Best regards,  Davonna Belling, MD , 09/28/2022    This note has been generated by a word recognition computerized program. There may be grammatical, typographical, or word substitution errors which have escaped my editorial review.

## 2022-09-30 ENCOUNTER — Ambulatory Visit: Payer: Self-pay | Admitting: Licensed Clinical Social Worker

## 2022-09-30 NOTE — Patient Outreach (Signed)
  Care Coordination  Follow Up Visit Note   09/30/2022 Name: Tanner [name] MRN: [id number] DOB: [birth year]  Tanner [name] is a 77 y.o. year old male who sees Myrlene Broker, MD for primary care. I spoke with  Tanner [name] by phone today.  What matters to the patients health and wellness today?  Understanding why he is falling  Patient reports two falls today and one fall earlier this week.  Denies being hurt states he has a few bruises.  Was using his cane when he fell, states he will start using his walker.  Reminder patient of CCM RN call next week and assisted with making appointment to see doctor tomorrow ref. the falls.   Goals Addressed             This Visit's Progress    decrease loneness and manage health       Activities and task to complete in order to accomplish goals.   Keep all upcoming appointment discussed today ( 10/01/22 appointment with Dr. Jonny Ruiz) Continue with compliance of taking medication prescribed by Doctor Self Support options  (use walker)        SDOH assessments and interventions completed:  No  Care Coordination Interventions:  Yes, provided  Interventions Today    Flowsheet Row Most Recent Value  Chronic Disease   Chronic disease during today's visit Hypertension (HTN), Congestive Heart Failure (CHF), Chronic Kidney Disease/End Stage Renal Disease (ESRD), Diabetes  General Interventions   General Interventions Discussed/Reviewed Communication with, Doctor Visits, General Interventions Reviewed  [would like to put volunteering on the back burner and focus on his health]  Doctor Visits Discussed/Reviewed Doctor Visits Discussed  [assisted with getting appointment scheduled ( patient reports 2  falls today and one earlier this week)]  Mental Health Interventions   Mental Health Discussed/Reviewed Mental Health Reviewed  [motivational interviewing, solution focused]  Safety Interventions   Safety Discussed/Reviewed Safety Discussed  [patient  will use walker]       Follow up plan: Follow up call scheduled for 4 weeks    Encounter Outcome:  Pt. Visit Completed   Tanner Hines, LCSW Social Work Care Coordination  [facility] Emmie Niemann [employer] [phone]

## 2022-09-30 NOTE — Patient Instructions (Signed)
Visit Information  Thank you for taking time to visit with me today. Please don't hesitate to contact me if I can be of assistance to you.   Following are the goals we discussed today:   Goals Addressed             This Visit's Progress    decrease loneness and manage health       Activities and task to complete in order to accomplish goals.   Keep all upcoming appointment discussed today ( 10/01/22 appointment with Dr. Jonny Ruiz) Continue with compliance of taking medication prescribed by Doctor Self Support options  (use walker)         Our next appointment is by telephone on 10/26/22 at 1:15  Please call the care guide team at [phone] if you need to cancel or reschedule your appointment.   If you are experiencing a Mental Health or Behavioral Health Crisis or need someone to talk to, please call the Suicide and Crisis Lifeline: 988 call the [place] National Suicide Prevention Lifeline: 580-557-3207 or TTY: 307-298-8182 TTY (313) 762-2679) to talk to a trained counselor call 1-800-273-TALK (toll free, 24 hour hotline) go to [facility] [address], [place] [phone])   Patient verbalizes understanding of instructions and care plan provided today and agrees to view in MyChart. Active MyChart status and patient understanding of how to access instructions and care plan via MyChart confirmed with patient.     Sammuel Hines, LCSW Social Work Care Coordination  [facility] [employer] [phone]

## 2022-10-01 ENCOUNTER — Encounter: Payer: Self-pay | Admitting: Internal Medicine

## 2022-10-01 ENCOUNTER — Ambulatory Visit (INDEPENDENT_AMBULATORY_CARE_PROVIDER_SITE_OTHER): Payer: Medicare HMO | Admitting: Internal Medicine

## 2022-10-01 VITALS — BP 136/82 | HR 70 | Temp 98.2°F | Ht 64.5 in | Wt 241.0 lb

## 2022-10-01 DIAGNOSIS — R296 Repeated falls: Secondary | ICD-10-CM

## 2022-10-01 DIAGNOSIS — Z7729 Contact with and (suspected ) exposure to other hazardous substances: Secondary | ICD-10-CM | POA: Insufficient documentation

## 2022-10-01 DIAGNOSIS — Z7985 Long-term (current) use of injectable non-insulin antidiabetic drugs: Secondary | ICD-10-CM

## 2022-10-01 DIAGNOSIS — E1169 Type 2 diabetes mellitus with other specified complication: Secondary | ICD-10-CM | POA: Diagnosis not present

## 2022-10-01 DIAGNOSIS — M1712 Unilateral primary osteoarthritis, left knee: Secondary | ICD-10-CM

## 2022-10-01 DIAGNOSIS — I5022 Chronic systolic (congestive) heart failure: Secondary | ICD-10-CM | POA: Diagnosis not present

## 2022-10-01 DIAGNOSIS — N1831 Chronic kidney disease, stage 3a: Secondary | ICD-10-CM | POA: Diagnosis not present

## 2022-10-01 LAB — CBC WITH DIFFERENTIAL/PLATELET
Basophils Absolute: 0.1 10*3/uL (ref 0.0–0.1)
Basophils Relative: 1.2 % (ref 0.0–3.0)
Eosinophils Absolute: 0.2 10*3/uL (ref 0.0–0.7)
Eosinophils Relative: 2.9 % (ref 0.0–5.0)
HCT: 42 % (ref 39.0–52.0)
Hemoglobin: 13.8 g/dL (ref 13.0–17.0)
Lymphocytes Relative: 18.4 % (ref 12.0–46.0)
Lymphs Abs: 1.6 10*3/uL (ref 0.7–4.0)
MCHC: 32.9 g/dL (ref 30.0–36.0)
MCV: 90.7 fl (ref 78.0–100.0)
Monocytes Absolute: 0.6 10*3/uL (ref 0.1–1.0)
Monocytes Relative: 7.2 % (ref 3.0–12.0)
Neutro Abs: 6 10*3/uL (ref 1.4–7.7)
Neutrophils Relative %: 70.3 % (ref 43.0–77.0)
Platelets: 232 10*3/uL (ref 150.0–400.0)
RBC: 4.63 Mil/uL (ref 4.22–5.81)
RDW: 15.2 % (ref 11.5–15.5)
WBC: 8.5 10*3/uL (ref 4.0–10.5)

## 2022-10-01 LAB — BASIC METABOLIC PANEL
BUN: 21 mg/dL (ref 6–23)
CO2: 36 mEq/L — ABNORMAL HIGH (ref 19–32)
Calcium: 9.4 mg/dL (ref 8.4–10.5)
Chloride: 99 mEq/L (ref 96–112)
Creatinine, Ser: 1.81 mg/dL — ABNORMAL HIGH (ref 0.40–1.50)
GFR: 35.52 mL/min — ABNORMAL LOW (ref >60.00)
Glucose, Bld: 86 mg/dL (ref 70–99)
Potassium: 4.5 mEq/L (ref 3.5–5.1)
Sodium: 141 mEq/L (ref 135–145)

## 2022-10-01 LAB — HEPATIC FUNCTION PANEL
ALT: 15 U/L (ref 0–53)
AST: 19 U/L (ref 0–37)
Albumin: 4 g/dL (ref 3.5–5.2)
Alkaline Phosphatase: 99 U/L (ref 39–117)
Bilirubin, Direct: 0.2 mg/dL (ref 0.0–0.3)
Total Bilirubin: 0.7 mg/dL (ref 0.2–1.2)
Total Protein: 6.6 g/dL (ref 6.0–8.3)

## 2022-10-01 MED ORDER — EMPAGLIFLOZIN 10 MG PO TABS: 10.0000 mg | ORAL_TABLET | Freq: Every day | ORAL | 3 refills | Status: DC

## 2022-10-01 NOTE — Patient Instructions (Signed)
Ok to restart the jardiance for sugar and kidneys  Please continue all other medications as before, including the ozempic per Dr C  Please have the pharmacy call with any other refills you may need.  Please continue your efforts at being more active, low cholesterol diet, and weight control  Please keep your appointments with your specialists as you may have planned  You will be contacted regarding the referral for: Home Health with RN and Physical Therapy  Please go to the LAB at the blood drawing area for the tests to be done  You will be contacted by phone if any changes need to be made immediately.  Otherwise, you will receive a letter about your results with an explanation, but please check with MyChart first.  Please remember to sign up for MyChart if you have not done so, as this will be important to you in the future with finding out test results, communicating by private email, and scheduling acute appointments online when needed.  Please make an Appointment to return in 3 months, or sooner if needed, to Dr Okey Dupre

## 2022-10-01 NOTE — Progress Notes (Signed)
The test results show that your current treatment is OK, as the tests are stable.  Please continue the same plan.  There is no other need for change of treatment or further evaluation based on these results, at this time.  thanks 

## 2022-10-01 NOTE — Progress Notes (Signed)
Patient ID: Tanner [name], male   DOB: 03-04-1946, 77 y.o.   MRN: [id number]        Chief Complaint: follow up right knee arthritis with recurrent falls, obesity, dm, ckd       HPI:  Tanner [name] is a 77 y.o. male here with c/o several recent falls without injury, but has ongoing right knee pain, has seen orthopedic and improved with cortisone, but needs to lose 20 lbs at least to be eligible for right knee TKR.  Did start ozempic 0.25 mg weekly recently and tolerating well, but hard to get currently as on backorder.  Pt denies chest pain, increased sob or doe, wheezing, orthopnea, PND, increased LE swelling, palpitations, dizziness or syncope.   Pt denies polydipsia, polyuria, or new focal neuro s/s.    Pt denies fever, wt loss, night sweats, loss of appetite, or other constitutional symptoms  Not currently taking jardiance       Wt Readings from Last 3 Encounters:  10/01/22 241 lb (109.3 kg)  08/31/22 236 lb (107 kg)  08/20/22 222 lb 6.4 oz (100.9 kg)   BP Readings from Last 3 Encounters:  10/01/22 136/82  09/01/22 138/80  08/31/22 (!) 150/78         Past Medical History:  Diagnosis Date   Acute kidney failure    Anxiety    Arthritis    Back pain    BPH (benign prostatic hypertrophy)    Clostridium difficile infection    Clotting disorder    Depression    Diabetes 12/11/2016   type 2    DVT (deep venous thrombosis)    DVT of deep femoral vein, right 06/29/2018   Edema, lower extremity    High cholesterol    Hypertension    Joint pain    Low back pain potentially associated with spinal stenosis    Neuromuscular disorder    Neuropathy of lower extremity    bilateral   OSA (obstructive sleep apnea)    pt denies    Osteoarthritis    PE (pulmonary embolism)    Pneumonia    hx of x 2    Ventral hernia    Past Surgical History:  Procedure Laterality Date   CARDIOVERSION N/A 08/19/2022   Procedure: CARDIOVERSION;  Surgeon: Meriam Sprague, MD;  Location: [facility] ENDOSCOPY;   Service: Cardiovascular;  Laterality: N/A;   EYE SURGERY     HERNIA REPAIR  1999   TOTAL KNEE ARTHROPLASTY Left 08/26/2020   Procedure: LEFT TOTAL KNEE ARTHROPLASTY;  Surgeon: Gean Birchwood, MD;  Location: [facility] ORS;  Service: Orthopedics;  Laterality: Left;    reports that he quit smoking about 21 years ago. His smoking use included cigarettes. He started smoking about 54 years ago. He has a 66.00 pack-year smoking history. He has never used smokeless tobacco. He reports current alcohol use. He reports that he does not use drugs. family history includes Alzheimer's disease in his mother; Depression in his mother; Diabetes in his mother; Hyperlipidemia in his mother; Other (age of onset: 50) in his father; Pneumonia in his mother; Thyroid disease in his mother. Allergies  Allergen Reactions   Lisinopril Other (See Comments)     Renal impairment   Amlodipine Swelling   Codeine Sulfate Itching and Nausea Only   Current Outpatient Medications on File Prior to Visit  Medication Sig Dispense Refill   acetaminophen (TYLENOL) 500 MG tablet Take 1,000 mg by mouth every 6 (six) hours as needed for moderate pain.  acidophilus (RISAQUAD) CAPS capsule Take 1 capsule by mouth daily.     amiodarone (PACERONE) 200 MG tablet Take 2 tablets twice daily until 08/28/22, then continue with one tablet daily starting on 08/29/22. 90 tablet 0   apixaban (ELIQUIS) 5 MG TABS tablet Take 5 mg by mouth 2 (two) times daily.     atorvastatin (LIPITOR) 80 MG tablet Take 1 tablet (80 mg total) by mouth daily. 30 tablet 5   buPROPion (WELLBUTRIN SR) 150 MG 12 hr tablet Take 150 mg by mouth 2 (two) times daily.     carvedilol (COREG) 6.25 MG tablet Take 1 tablet (6.25 mg total) by mouth 2 (two) times daily with a meal. 180 tablet 1   clobetasol ointment (TEMOVATE) 0.05 % Apply 1 Application topically daily as needed.     finasteride (PROSCAR) 5 MG tablet Take 5 mg by mouth at bedtime.     gabapentin (NEURONTIN) 300 MG capsule  Take 300-600 mg by mouth See admin instructions. Take 600 mg by mouth three times daily and  300 mg at night     ketoconazole (NIZORAL) 2 % shampoo Apply 1 application  topically every other day.     lidocaine (XYLOCAINE) 2 % solution Use as directed 15 mLs in the mouth or throat as needed for mouth pain. 100 mL 0   Menthol-Methyl Salicylate (THERA-GESIC PLUS) CREA Apply 1 application topically at bedtime. Apply to lower back     Multiple Vitamins-Minerals (MULTIVITAMINS THER. W/MINERALS) TABS Take 1 tablet by mouth daily.     nortriptyline (PAMELOR) 10 MG capsule 2 CAPSULES EVERY NIGHT AT BEDTIME (Patient taking differently: Take 20 mg by mouth at bedtime.) 180 capsule 0   saccharomyces boulardii (FLORASTOR) 250 MG capsule Take 1 capsule (250 mg total) by mouth 2 (two) times daily. 30 capsule 0   Semaglutide, 1 MG/DOSE, 4 MG/3ML SOPN Inject 1 mg as directed once a week. 3 mL 0   Semaglutide, 2 MG/DOSE, 8 MG/3ML SOPN Inject 2 mg as directed once a week. 3 mL 11   Semaglutide,0.25 or 0.5MG /DOS, (OZEMPIC, 0.25 OR 0.5 MG/DOSE,) 2 MG/3ML SOPN INJECT 0.25 MG INTO THE SKIN ONCE A WEEK FOR 28 DAYS, THEN 0.5 MG ONCE A WEEK FOR 28 DAYS. 6 mL 0   sildenafil (VIAGRA) 100 MG tablet Take 100 mg by mouth as needed.     torsemide (DEMADEX) 20 MG tablet Take 1 tablet (20 mg total) by mouth daily. 90 tablet 1   No current facility-administered medications on file prior to visit.        ROS:  All others reviewed and negative.  Objective        PE:  BP 136/82 (BP Location: Left Arm, Patient Position: Sitting, Cuff Size: Normal)   Pulse 70   Temp 98.2 F (36.8 C) (Oral)   Ht 5' 4.5" (1.638 m)   Wt 241 lb (109.3 kg)   SpO2 94%   BMI 40.73 kg/m                 Constitutional: Pt appears in NAD               HENT: Head: NCAT.                Right Ear: External ear normal.                 Left Ear: External ear normal.                Eyes: .  Pupils are equal, round, and reactive to light. Conjunctivae and  EOM are normal               Nose: without d/c or deformity               Neck: Neck supple. Gross normal ROM               Cardiovascular: Normal rate and regular rhythm.                 Pulmonary/Chest: Effort normal and breath sounds without rales or wheezing.                Abd:  Soft, NT, ND, + BS, no organomegaly               Neurological: Pt is alert. At baseline orientation, motor grossly intact               Skin: Skin is warm. No rashes, no other new lesions, LE edema - none;  right knee with severe degenerative arthritic changes               Psychiatric: Pt behavior is normal without agitation   Micro: none  Cardiac tracings I have personally interpreted today:  none  Pertinent Radiological findings (summarize): none   Lab Results  Component Value Date   WBC 8.5 10/01/2022   HGB 13.8 10/01/2022   HCT 42.0 10/01/2022   PLT 232.0 10/01/2022   GLUCOSE 86 10/01/2022   CHOL 105 12/04/2021   TRIG 172.0 (H) 12/04/2021   HDL 30.20 (L) 12/04/2021   LDLDIRECT 65.9 09/13/2007   LDLCALC 41 12/04/2021   ALT 15 10/01/2022   AST 19 10/01/2022   NA 141 10/01/2022   K 4.5 10/01/2022   CL 99 10/01/2022   CREATININE 1.81 (H) 10/01/2022   BUN 21 10/01/2022   CO2 36 (H) 10/01/2022   TSH 5.53 (H) 09/01/2022   PSA 1.09 09/13/2007   INR 1.1 08/18/2022   HGBA1C 5.2 08/19/2022   MICROALBUR 29.1 (H) 12/04/2021   Assessment/Plan:  Tanner [name] is a 77 y.o. White or Caucasian [1] male with  has a past medical history of Acute kidney failure, Anxiety, Arthritis, Back pain, BPH (benign prostatic hypertrophy), Clostridium difficile infection, Clotting disorder, Depression, Diabetes (12/11/2016), DVT (deep venous thrombosis), DVT of deep femoral vein, right (06/29/2018), Edema, lower extremity, High cholesterol, Hypertension, Joint pain, Low back pain potentially associated with spinal stenosis, Neuromuscular disorder, Neuropathy of lower extremity, OSA (obstructive sleep apnea),  Osteoarthritis, PE (pulmonary embolism), Pneumonia, and Ventral hernia.  Diabetes (HCC) Lab Results  Component Value Date   HGBA1C 5.2 08/19/2022   Stable, pt to continue current medical treatment ozempic 0.25 mg weekly and titrate up as per PCP for hopefully wt loss and sugar control   Morbid obesity (HCC) Also hopefully to improve with ozempic as well  OA (osteoarthritis) of knee Severe, with recent falls, for ozempic for wt loss and f/u ortho for TKR when eligible, also for now for [facility] with RN, PT  Chronic kidney disease (CKD), stage III (moderate) (HCC) Lab Results  Component Value Date   CREATININE 1.81 (H) 10/01/2022   Stable overall, cont to avoid nephrotoxins, also for jardiance 10 mg qd restart   Chronic systolic heart failure (HCC) Volume stable, cont current med tx  Followup: Return in about 3 months (around 12/31/2022) for follow up with Dr Okey Dupre.  Oliver Barre, MD 10/03/2022 4:31 PM [facility]  Primary Care - [facility] Internal Medicine

## 2022-10-03 NOTE — Assessment & Plan Note (Signed)
Volume stable, cont current med tx 

## 2022-10-03 NOTE — Assessment & Plan Note (Signed)
Severe, with recent falls, for ozempic for wt loss and f/u ortho for TKR when eligible, also for now for [facility] with RN, PT

## 2022-10-03 NOTE — Assessment & Plan Note (Signed)
Lab Results  Component Value Date   HGBA1C 5.2 08/19/2022   Stable, pt to continue current medical treatment ozempic 0.25 mg weekly and titrate up as per PCP for hopefully wt loss and sugar control

## 2022-10-03 NOTE — Assessment & Plan Note (Addendum)
Lab Results  Component Value Date   CREATININE 1.81 (H) 10/01/2022   Stable overall, cont to avoid nephrotoxins, also for jardiance 10 mg qd restart

## 2022-10-03 NOTE — Assessment & Plan Note (Signed)
Also hopefully to improve with ozempic as well

## 2022-10-07 DIAGNOSIS — Z87891 Personal history of nicotine dependence: Secondary | ICD-10-CM | POA: Diagnosis not present

## 2022-10-07 DIAGNOSIS — Z86711 Personal history of pulmonary embolism: Secondary | ICD-10-CM | POA: Diagnosis not present

## 2022-10-07 DIAGNOSIS — M1711 Unilateral primary osteoarthritis, right knee: Secondary | ICD-10-CM | POA: Diagnosis not present

## 2022-10-07 DIAGNOSIS — I700 Atherosclerosis of aorta: Secondary | ICD-10-CM | POA: Diagnosis not present

## 2022-10-07 DIAGNOSIS — N1831 Chronic kidney disease, stage 3a: Secondary | ICD-10-CM | POA: Diagnosis not present

## 2022-10-07 DIAGNOSIS — I5022 Chronic systolic (congestive) heart failure: Secondary | ICD-10-CM | POA: Diagnosis not present

## 2022-10-07 DIAGNOSIS — Z6841 Body Mass Index (BMI) 40.0 and over, adult: Secondary | ICD-10-CM | POA: Diagnosis not present

## 2022-10-07 DIAGNOSIS — Z7985 Long-term (current) use of injectable non-insulin antidiabetic drugs: Secondary | ICD-10-CM | POA: Diagnosis not present

## 2022-10-07 DIAGNOSIS — I340 Nonrheumatic mitral (valve) insufficiency: Secondary | ICD-10-CM | POA: Diagnosis not present

## 2022-10-07 DIAGNOSIS — N400 Enlarged prostate without lower urinary tract symptoms: Secondary | ICD-10-CM | POA: Diagnosis not present

## 2022-10-07 DIAGNOSIS — Z86718 Personal history of other venous thrombosis and embolism: Secondary | ICD-10-CM | POA: Diagnosis not present

## 2022-10-07 DIAGNOSIS — G709 Myoneural disorder, unspecified: Secondary | ICD-10-CM | POA: Diagnosis not present

## 2022-10-07 DIAGNOSIS — E785 Hyperlipidemia, unspecified: Secondary | ICD-10-CM | POA: Diagnosis not present

## 2022-10-07 DIAGNOSIS — G4733 Obstructive sleep apnea (adult) (pediatric): Secondary | ICD-10-CM | POA: Diagnosis not present

## 2022-10-07 DIAGNOSIS — F411 Generalized anxiety disorder: Secondary | ICD-10-CM | POA: Diagnosis not present

## 2022-10-07 DIAGNOSIS — Z9181 History of falling: Secondary | ICD-10-CM | POA: Diagnosis not present

## 2022-10-07 DIAGNOSIS — Z96652 Presence of left artificial knee joint: Secondary | ICD-10-CM | POA: Diagnosis not present

## 2022-10-07 DIAGNOSIS — M545 Low back pain, unspecified: Secondary | ICD-10-CM | POA: Diagnosis not present

## 2022-10-07 DIAGNOSIS — E1122 Type 2 diabetes mellitus with diabetic chronic kidney disease: Secondary | ICD-10-CM | POA: Diagnosis not present

## 2022-10-07 DIAGNOSIS — D689 Coagulation defect, unspecified: Secondary | ICD-10-CM | POA: Diagnosis not present

## 2022-10-07 DIAGNOSIS — E1142 Type 2 diabetes mellitus with diabetic polyneuropathy: Secondary | ICD-10-CM | POA: Diagnosis not present

## 2022-10-07 DIAGNOSIS — I130 Hypertensive heart and chronic kidney disease with heart failure and stage 1 through stage 4 chronic kidney disease, or unspecified chronic kidney disease: Secondary | ICD-10-CM | POA: Diagnosis not present

## 2022-10-07 DIAGNOSIS — R296 Repeated falls: Secondary | ICD-10-CM | POA: Diagnosis not present

## 2022-10-07 DIAGNOSIS — F32A Depression, unspecified: Secondary | ICD-10-CM | POA: Diagnosis not present

## 2022-10-09 ENCOUNTER — Encounter (HOSPITAL_COMMUNITY): Payer: Self-pay

## 2022-10-09 ENCOUNTER — Emergency Department (HOSPITAL_COMMUNITY): Payer: Commercial Managed Care - PPO

## 2022-10-09 ENCOUNTER — Emergency Department
Admission: EM | Admit: 2022-10-09 | Discharge: 2022-10-09 | Disposition: A | Discharge status: Home / Self Care | Payer: Commercial Managed Care - PPO | Attending: Emergency Medicine | Admitting: Emergency Medicine

## 2022-10-09 ENCOUNTER — Other Ambulatory Visit: Payer: Self-pay

## 2022-10-09 ENCOUNTER — Emergency Department (HOSPITAL_COMMUNITY): Payer: Commercial Managed Care - PPO | Admitting: Ultrasound

## 2022-10-09 DIAGNOSIS — R9431 Abnormal electrocardiogram [ECG] [EKG]: Secondary | ICD-10-CM

## 2022-10-09 DIAGNOSIS — I4891 Unspecified atrial fibrillation: Secondary | ICD-10-CM | POA: Insufficient documentation

## 2022-10-09 DIAGNOSIS — R601 Generalized edema: Secondary | ICD-10-CM | POA: Insufficient documentation

## 2022-10-09 DIAGNOSIS — M7989 Other specified soft tissue disorders: Secondary | ICD-10-CM | POA: Insufficient documentation

## 2022-10-09 DIAGNOSIS — I4581 Long QT syndrome: Secondary | ICD-10-CM

## 2022-10-09 DIAGNOSIS — Z7901 Long term (current) use of anticoagulants: Secondary | ICD-10-CM | POA: Insufficient documentation

## 2022-10-09 DIAGNOSIS — S8012XA Contusion of left lower leg, initial encounter: Secondary | ICD-10-CM | POA: Insufficient documentation

## 2022-10-09 DIAGNOSIS — X58XXXA Exposure to other specified factors, initial encounter: Secondary | ICD-10-CM

## 2022-10-09 DIAGNOSIS — M79605 Pain in left leg: Secondary | ICD-10-CM

## 2022-10-09 HISTORY — DX: Malignant (primary) neoplasm, unspecified: C80.1

## 2022-10-09 HISTORY — DX: Unspecified atrial flutter: I48.92

## 2022-10-09 LAB — COMPREHENSIVE METABOLIC PANEL, NON-FASTING
ALBUMIN: 3.8 g/dL (ref 3.4–4.8)
ALKALINE PHOSPHATASE: 106 U/L (ref 45–115)
ALT (SGPT): 13 U/L (ref 10–55)
ANION GAP: 7 mmol/L (ref 4–13)
AST (SGOT): 14 U/L (ref 8–45)
BILIRUBIN TOTAL: 0.9 mg/dL (ref 0.3–1.3)
BUN/CREA RATIO: 22 (ref 6–22)
BUN: 20 mg/dL (ref 8–25)
CALCIUM: 8.6 mg/dL (ref 8.6–10.3)
CHLORIDE: 106 mmol/L (ref 96–111)
CO2 TOTAL: 29 mmol/L (ref 23–31)
CREATININE: 0.91 mg/dL (ref 0.75–1.35)
ESTIMATED GFR - MALE: 87 mL/min/BSA (ref >=60)
GLUCOSE: 133 mg/dL — ABNORMAL HIGH (ref 65–125)
POTASSIUM: 3.6 mmol/L (ref 3.5–5.1)
PROTEIN TOTAL: 6.2 g/dL (ref 6.0–8.0)
SODIUM: 142 mmol/L (ref 136–145)

## 2022-10-09 LAB — CBC WITH DIFF
BASOPHIL #: <0.1 10*3/uL (ref <=0.20)
BASOPHIL %: 1 %
EOSINOPHIL #: 0.19 10*3/uL (ref <=0.50)
EOSINOPHIL %: 2 %
HCT: 42.4 % (ref 38.9–52.0)
HGB: 13.8 g/dL (ref 13.4–17.5)
IMMATURE GRANULOCYTE #: <0.1 10*3/uL (ref <0.10)
IMMATURE GRANULOCYTE %: 0 % (ref 0.0–1.0)
LYMPHOCYTE #: 1.65 10*3/uL (ref 1.00–4.80)
LYMPHOCYTE %: 19 %
MCH: 31 pg (ref 26.0–32.0)
MCHC: 32.5 g/dL (ref 31.0–35.5)
MCV: 95.3 fL (ref 78.0–100.0)
MONOCYTE #: 0.38 10*3/uL (ref 0.20–1.10)
MONOCYTE %: 4 %
MPV: 10.8 fL (ref 8.7–12.5)
NEUTROPHIL #: 6.6 10*3/uL (ref 1.50–7.70)
NEUTROPHIL %: 74 %
PLATELETS: 155 10*3/uL (ref 150–400)
RBC: 4.45 10*6/uL — ABNORMAL LOW (ref 4.50–6.10)
RDW-CV: 13.1 % (ref 11.5–15.5)
WBC: 8.9 10*3/uL (ref 3.7–11.0)

## 2022-10-09 LAB — PT/INR
INR: 1.14 — ABNORMAL HIGH (ref 0.90–1.10)
PROTHROMBIN TIME: 11.9 seconds (ref 9.0–13.0)

## 2022-10-09 LAB — LIGHT GREEN TOP TUBE

## 2022-10-09 LAB — ECG 12 LEAD
Calculated R Axis: -50 degrees
Calculated T Axis: 25 degrees
QRS Duration: 114 ms
QT Interval: 574 ms
QTC Calculation: 529 ms
Ventricular rate: 51 {beats}/min

## 2022-10-09 LAB — LAVENDER TOP TUBE

## 2022-10-09 LAB — BLUE TOP TUBE

## 2022-10-09 LAB — RED TOP TUBE

## 2022-10-09 LAB — PTT (PARTIAL THROMBOPLASTIN TIME): APTT: 29.9 seconds (ref 23.0–32.0)

## 2022-10-09 NOTE — ED Nurses Note (Signed)
Patient discharged home with family.  AVS reviewed with patient/care giver.  A written copy of the AVS and discharge instructions was given to the patient/care giver. Scripts handed to patient/care giver. Questions sufficiently answered as needed.  Patient/care giver encouraged to follow up with PCP as indicated.  In the event of an emergency, patient/care giver instructed to call 911 or go to the nearest emergency room.

## 2022-10-09 NOTE — ED Provider Notes (Signed)
[place] - Emergency Department      Attending Physician: Dr. Emeline Gins  CC:  Chief Complaint   Patient presents with    Leg Swelling     Patient seen immediately on arrival to the emergency room.  HPI:  Thomas [name] is a 77 y.o. male who presents to the Emergency Room with c/o swelling tenderness on left leg below the knee in the upper shin area.  He has been going on for last 2 weeks.  Patient is on Eliquis for AFib.  He also generalized edema of his left lower extremity but not right leg.  He denies chest pain or shortness of breath.    Review of Systems:  Constitutional:  Constitutional symptoms negative except what describe in history.  Skin:  Negative except what Idescribed in his history.  HENT:Negative except what I described in his history.  Eyes: Negative except what a described in his history.  Cardio: Negative except what I described in his history.  Respiratory:Negative except what I described in his history.  GI: Negative except what I a described in his history.  GU: Negative except what I described in his history.  MSK: Negative except what I described in his history.  Neuro: Negative except what I described in his history.   All other systems reviewed and are negative or as noted in HPI.    History:  PMH:    Past Medical History:   Diagnosis Date    Arthritis     Atrial flutter (CMS HCC)     Cancer (CMS HCC)     Dysuria     Enteritis due to Rotavirus     Gout     Headache     Hemorrhoid     Hypercholesterolemia     Hypertension          Previous Medications    ACETAMINOPHEN (TYLENOL) 500 MG ORAL TABLET    Take 1 Tablet (500 mg total) by mouth Every 4 hours as needed for Pain (pt takes tylenol pm)    ALFUZOSIN (UROXATRAL) 10 MG ORAL TABLET SUSTAINED RELEASE 24 HR    TAKE ONE TABLET BY MOUTH EVERY DAY    APIXABAN (ELIQUIS) 5 MG ORAL TABLET    Take 1 Tablet (5 mg total) by mouth Twice daily SHIP TO PATIENT,   [id number]  STATE LICENSE- [id number]  NPI- [id number]    DILTIAZEM HCL (TIAZAC)  240 MG ORAL CAPSULE,SUSTAINED ACTION 24 HR    TAKE ONE CAPSULE BY MOUTH DAILY    FINASTERIDE (PROSCAR) 5 MG ORAL TABLET    TAKE ONE TABLET BY MOUTH EVERY DAY    FLECAINIDE (TAMBOCOR) 100 MG ORAL TABLET    TAKE ONE TABLET BY MOUTH TWICE DAILY    FUROSEMIDE (LASIX) 40 MG ORAL TABLET    TAKE ONE TABLET BY MOUTH EVERY DAY    LOSARTAN (COZAAR) 50 MG ORAL TABLET    Take 1 Tablet (50 mg total) by mouth Once a day    NITROGLYCERIN (NITROSTAT) 0.4 MG SUBLINGUAL TABLET, SUBLINGUAL    Place 1 Tablet (0.4 mg total) under the tongue Every 5 minutes as needed for Chest pain    POTASSIUM CHLORIDE (KLOR-CON) 10 MEQ ORAL TABLET SUSTAINED RELEASE    TAKE ONE TABLET BY MOUTH EVERY DAY    ROSUVASTATIN (CRESTOR) 10 MG ORAL TABLET    Take 1 Tablet (10 mg total) by mouth Every evening    SPIRIVA WITH HANDIHALER 18 MCG INHALATION CAPSULE, W/INHALATION DEVICE    Take  1 Capsule (18 mcg total) by inhalation Once a day       PSH:    Past Surgical History:   Procedure Laterality Date    HX HEMORRHOIDECTOMY           Social Hx:    Social History     Socioeconomic History    Marital status: Widowed     Spouse name: Not on file    Number of children: Not on file    Years of education: Not on file    Highest education level: Not on file   Occupational History    Not on file   Tobacco Use    Smoking status: Former     Types: Cigars    Smokeless tobacco: Current     Types: Chew, Snuff   Vaping Use    Vaping status: Never Used   Substance and Sexual Activity    Alcohol use: Yes     Comment: few days a week    Drug use: Never    Sexual activity: Not Currently   Other Topics Concern    Not on file   Social History Narrative    Not on file     Social Determinants of Health     Financial Resource Strain: Low Risk  (07/22/2022)    Financial Resource Strain     SDOH Financial: No   Transportation Needs: Low Risk  (07/22/2022)    Transportation Needs     SDOH Transportation: No   Social Connections: Low Risk  (07/22/2022)    Social Connections     SDOH Social  Isolation: 5 or more times a week   Intimate Partner Violence: Low Risk  (07/22/2022)    Intimate Partner Violence     SDOH Domestic Violence: I have not had a partner in the past year.   Housing Stability: Low Risk  (07/22/2022)    Housing Stability     SDOH Housing Situation: I have housing.     SDOH Housing Worry: No     Family Hx:   Family History   Problem Relation Age of Onset    Hypertension (High Blood Pressure) Mother     Cancer Mother     Hypertension (High Blood Pressure) Father      Allergies: No Known Allergies    Above history reviewed with patient, changes are as documented.    Physical Exam:   Nursing note and vitals reviewed.  ED Triage Vitals [10/09/22 1009]   BP (Non-Invasive) 132/68   Heart Rate (!) 45   Respiratory Rate 18   Temperature (!) 35.8 C (96.4 F)   SpO2 98 %   Weight 110 kg (243 lb)   Height 1.803 m (5\' 11" )       Constitutional: Pt is alert and oriented and appears well-developed and well-nourished. No acute distress.   Head.  Atraumatic.  Pupils.  Equal and reacting to light.  HENT:   Mouth/Throat: Oropharynx is clear and moist.  Tonsils not enlarged no inflammation.  Eyes: Conjunctivae and extraocular motions are normal. Pupils are equal, round, and reactive to light.   Neck: Normal range of motion. Neck supple.  No JVDs or bruits trachea midline  Cardiovascular:  Heart S1-S2 regular no gallop or murmur.  Normal rate, regular rhythm and intact distal pulses.    Pulmonary/Chest: Effort normal. No respiratory distress. Breath sounds normal.  Lungs are clear no rales rhonchi or wheezing.  Abdominal: Abdomen is soft, nontender, nondistended.  No rebound or  guarding noted.  Bowel sounds are present.  Liver spleen and kidneys are not palpable.  Musculoskeletal:  Examination of left lower extremity reveals that below his left knee and the front on shin area there is a large subcutaneous fluctuating hematoma.  Neurological: Pt is alert and oriented. Grossly intact. Moving all extremities.  Facies symmetric.  No motor or sensory deficits.  Skin: Skin is warm and dry without rash.  Color normal.      Course:   Impression: Pt presenting c/o   Chief Complaint   Patient presents with    Leg Swelling       Plan:Will obtain the following labs/imaging and give patient the following medications to alleviate symptoms:  Orders Placed This Encounter    XR TIBIA-FIBULA LEFT    RAINBOW DRAW - UTN ONLY    BLUE TOP TUBE    LIGHT GREEN TOP TUBE    LAVENDER TOP TUBE    RED TOP TUBE    CANCELED: CBC/DIFF    CANCELED: COMPREHENSIVE METABOLIC PANEL, NON-FASTING    CANCELED: PT/INR    CANCELED: PTT (PARTIAL THROMBOPLASTIN TIME)    CANCELED: CBC WITH DIFF    CBC/DIFF    COMPREHENSIVE METABOLIC PANEL, NON-FASTING    PT/INR    PTT (PARTIAL THROMBOPLASTIN TIME)    CBC WITH DIFF    ECG 12 LEAD    PERIPHERAL VENOUS DUPLEX - LOWER     Laboratory Results:    Results for orders placed or performed during the hospital encounter of 10/09/22 (from the past 12 hour(s))   COMPREHENSIVE METABOLIC PANEL, NON-FASTING   Result Value Ref Range    SODIUM 142 136 - 145 mmol/L    POTASSIUM 3.6 3.5 - 5.1 mmol/L    CHLORIDE 106 96 - 111 mmol/L    CO2 TOTAL 29 23 - 31 mmol/L    ANION GAP 7 4 - 13 mmol/L    BUN 20 8 - 25 mg/dL    CREATININE 6.21 3.08 - 1.35 mg/dL    BUN/CREA RATIO 22 6 - 22    ALBUMIN 3.8 3.4 - 4.8 g/dL     CALCIUM 8.6 8.6 - 65.7 mg/dL    GLUCOSE 846 (H) 65 - 125 mg/dL    ALKALINE PHOSPHATASE 106 45 - 115 U/L    ALT (SGPT) 13 10 - 55 U/L    AST (SGOT)  14 8 - 45 U/L    BILIRUBIN TOTAL 0.9 0.3 - 1.3 mg/dL    PROTEIN TOTAL 6.2 6.0 - 8.0 g/dL    ESTIMATED GFR - MALE 87 >=60 mL/min/BSA   PT/INR   Result Value Ref Range    PROTHROMBIN TIME 11.9 9.0 - 13.0 seconds    INR 1.14 (H) 0.90 - 1.10   PTT (PARTIAL THROMBOPLASTIN TIME)   Result Value Ref Range    APTT 29.9 23.0 - 32.0 seconds   CBC WITH DIFF   Result Value Ref Range    WBC 8.9 3.7 - 11.0 x10^3/uL    RBC 4.45 (L) 4.50 - 6.10 x10^6/uL    HGB 13.8 13.4 - 17.5 g/dL    HCT 96.2 95.2 -  84.1 %    MCV 95.3 78.0 - 100.0 fL    MCH 31.0 26.0 - 32.0 pg    MCHC 32.5 31.0 - 35.5 g/dL    RDW-CV 32.4 40.1 - 02.7 %    PLATELETS 155 150 - 400 x10^3/uL    MPV 10.8 8.7 - 12.5 fL    NEUTROPHIL % 74.0 %  LYMPHOCYTE % 19.0 %    MONOCYTE % 4.0 %    EOSINOPHIL % 2.0 %    BASOPHIL % 1.0 %    NEUTROPHIL # 6.60 1.50 - 7.70 x10^3/uL    LYMPHOCYTE # 1.65 1.00 - 4.80 x10^3/uL    MONOCYTE # 0.38 0.20 - 1.10 x10^3/uL    EOSINOPHIL # 0.19 <=0.50 x10^3/uL    BASOPHIL # <0.10 <=0.20 x10^3/uL    IMMATURE GRANULOCYTE % 0.0 0.0 - 1.0 %    IMMATURE GRANULOCYTE # <0.10 <0.10 x10^3/uL        Radiographical Imaging:   Results for orders placed or performed during the hospital encounter of 10/09/22   XR TIBIA-FIBULA LEFT     Status: None    Narrative    INDICATION:  Left leg pain and swelling.    TECHNIQUE:  Two views of the left tibia and fibula.    COMPARISON:  None Available.    FINDINGS:    There is no displaced fracture.  The alignment is anatomic.  Generalized soft tissue edema is noted..  No subcutaneous gas      Impression    1.  No acute fractures subluxation or osseous lesions.      Signed by Dallie Dad, MD       EKG-   Most Recent EKG This Encounter   ECG 12 LEAD    Collection Time: 10/09/22 10:26 AM   Result Value    Ventricular rate 51    QRS Duration 114    QT Interval 574    QTC Calculation 529    Calculated R Axis -50    Calculated T Axis 25    Narrative    Atrial fibrillation with slow ventricular response  Left axis deviation  Pulmonary disease pattern  Inferior infarct (cited on or before 15-Jan-2022)  Prolonged QT  Abnormal ECG  When compared with ECG of 22-Jul-2022 15:30,  Atrial fibrillation has replaced Sinus rhythm  Criteria for Anterior infarct are no longer present  QT has lengthened  Confirmed by Ninfa Linden, Joniah Bednarski ([id number]) on 10/09/2022 10:32:26 AM         All labs were reviewed. Medical Records reviewed.     MDM/Course:    Medical Decision Making  Amount and/or Complexity of Data Reviewed  Labs:  ordered.  Radiology: ordered.         Visit Vitals  Vitals:    10/09/22 1009 10/09/22 1030 10/09/22 1045   BP: 132/68 109/63    Pulse: (!) 45 (!) 45 (!) 46   Resp: 18 14    Temp: (!) 35.8 C (96.4 F)     SpO2: 98% 95% 95%   Weight: 110 kg (243 lb)     Height: 1.803 m (5\' 11" )     BMI: 33.96                Disposition: Discharged    Clinical Impression:   Clinical Impression   Pain and swelling of left lower extremity   Leg hematoma, left, initial encounter (Primary)     Follow Up: Rhoderick Moody, DO  [address]  [place] [address]  [phone]    In 1 week      Medications Prescribed:   New Prescriptions    No medications on file   Plan and treatment.  Patient has hematoma on his left leg just below his knee anteriorly.  He is currently on Eliquis.  I told patient told Eliquis were 4 days and put local cold  compresses and also follow-up your doctor.      Emeline Gins, MD 10/09/2022, 12:05

## 2022-10-09 NOTE — ED Triage Notes (Addendum)
Takes Eliquis-since yesterday-there is bruising/abrasions/swelling/pain from left shin area to below his knee. Denies injury. Hr in triage is 42-45

## 2022-10-10 ENCOUNTER — Telehealth (HOSPITAL_COMMUNITY): Payer: Self-pay | Admitting: Family Medicine

## 2022-10-10 NOTE — Telephone Encounter (Signed)
Post Ed Follow-Up    Post ED Follow-Up:   Document completed and/or attempted interactive contact(s) after transition to home after emergency department stay.:   Transition Facility and relevant Date:   Discharge Date: 10/09/22  Discharge from [facility] Emergency Department?: Yes  Discharge Facility: [facility]  Contacted by: Glorianne Manchester, RN  Contact method: Patient/Caregiver Telephone  Contact completed: 10/10/2022 11:59 AM  MyChart message sent?: No  Was the AVS reviewed with patient?: Yes  How is the patient recovering?: Improving  Medications prescribed: No  Interventions: No needs identified

## 2022-10-13 ENCOUNTER — Other Ambulatory Visit: Payer: Self-pay

## 2022-10-13 ENCOUNTER — Ambulatory Visit
Payer: Commercial Managed Care - PPO | Attending: Student in an Organized Health Care Education/Training Program | Admitting: Student in an Organized Health Care Education/Training Program

## 2022-10-13 ENCOUNTER — Encounter (HOSPITAL_COMMUNITY): Payer: Self-pay | Admitting: Student in an Organized Health Care Education/Training Program

## 2022-10-13 ENCOUNTER — Telehealth: Payer: Self-pay

## 2022-10-13 VITALS — BP 126/72 | HR 50 | Temp 97.3°F | Resp 16 | Ht 71.0 in | Wt 239.0 lb

## 2022-10-13 DIAGNOSIS — J449 Chronic obstructive pulmonary disease, unspecified: Secondary | ICD-10-CM | POA: Insufficient documentation

## 2022-10-13 NOTE — Telephone Encounter (Signed)
Called back and confirmed with the nurse about patient advise from Dr Okey Dupre, nurse verbalized agreement and understanding and will have patient schedule a visit to be seen in office with [name].

## 2022-10-13 NOTE — Telephone Encounter (Signed)
If weight is up from normal advise double on fluid pill torsemide dosing for 3 days. If any SOB please advise to go to urgent care. If no SOB okay to make visit at office this week to assess within 2 days.

## 2022-10-13 NOTE — Progress Notes (Signed)
PULMONARY OFFICE VISIT    Shameek, Nyquist, 77 y.o. male  Date of service: 11/11/2021  Date of Birth:  04-Oct-1945    IMPRESSION:  Mild Chronic obstructive pulmonary disease gold stage a FEV1 84% predicted  Elevated left hemidiaphragm  Suspected sleep apnea  Former smoker  Secondhand Smoke exposure  Obesity  Atrial fibrillation/flutter with history of cardioversion  Hypertension  Left bundle branch block    RECOMMENDATIONS:  Trial off Spiriva to monitor respiratory symptoms  Prior testing from 2020 shows normal diaphragmatic function  Patient defers sleep apnea testing   Patient has lost 35 lb since last visit encouraged to continue weight loss as this will help improve his breathing.  Encouraged patient to continue weight loss, given beneficial effect on his or all health and is breathing. Offered weight loss clinic referral but patient wishes to continue to lose weight on his own.  Advised to avoid driving or operative heavy machinery when sleepy.  Return to clinic in 5 months  No orders of the defined types were placed in this encounter.     Problem List Items Addressed This Visit    None      CHIEF COMPLAINT:  Shortness of breath    HISTORY OF PRESENT ILLNESS:    BELL CAI is a 77 y.o. male with PMH of COPD COLD stage B, former smoker, questionable elevated left diaphragm, obesity, hypertension, atrial fibrillation/flutter with history of cardioversion, left bundle branch block.      From my prior note 01/2021:  Patient is here to establish care.  He was diagnosed with COPD 2 years ago and was started on Anoro.  He states that he has been doing pretty well since he has been on air Anoro he.  He noted significant improvement in his shortness of breath.  He does not need albuterol at home.  He has not had any ED visits or hospitalizations.  He has not had any prednisone or antibiotics to use in the last 2 years.  He is able to walk on flat ground without any limitation in his activity, however he is only able  to go up 3 flights of stairs before he gets short of breath.  Patient is High risk for OSA given the history of hypertension, elevated BMI, symptoms of snoring, occasional morning headaches, and Epworth Sleepiness Scale of 10.   Per review of prior records from Care everywhere patient saw Dr. Hadley Pen from pulmonary in [facility] on 04/05/2019 at which time he was planned to be evaluated with stiff testing, sleep apnea testing and pulmonary function testing.  The testing was not completed at that time.  11/11/21  Patient reports that his breathing has gotten worse but he resumed taking Spiriva and since then he has been feeling better.  He also notes that his blood pressure is decreased since he resumed Spiriva use.  He does not report any other complaints.  He has been doing well with exertion.  We discussed at length the high risk of having sleep apnea and the benefits of treating it.  The patient at this time defers testing as he does not want to be bound to wearing a CPAP while going to sleep.  We also discussed alternative therapies if he is found to have sleep apnea however at this time he wishes not to pursue further testing.  Symptoms:  Shortness of breath on exertion  SABA use: none  Triggers:  Exertion.  No known allergic triggers  Intubations:  None  Exacerbations in the last year:  None  Prednisone use in the last year:  None  Antibiotic use in the last year:  None  Disease severity:  COPD gold stage A  Tobacco:  Patient has been smoking cigars and pipes from age 30 to age 69.   Occupation: Engineer, water   Exposures: (asbestos, coal, silica, mold): none  Travel: none  Pets/birds: none  Family history of lung disorder: none   Family history of lung cancer: none  Brother has OSA    04/13/22 Reports no new respiratory complaints. Has been breathing better since he last 20-30lb over the last few months for walking more. He does not report and wheezing coughing chest discomfort or  worsening dyspnea. EWSS today is 3. He recently lost his significant other, and is working on fixing up his old house that he used to live in 5 years ago, with the help of his daughter. He is hoping to lose a few more pounds so and hopes it will help him breath better so he no longer needs to use Spiriva.    Today 10/13/2022 patient states he is doing okay from a breathing standpoint.  He has not needed his rescue inhaler at all.  He does use Spiriva daily.  He does not feel that he needs it any longer as his breathing has improved significantly after losing 35 lb over the last few months.  No recent exacerbations of chronic obstructive pulmonary disease.  He does have fluctuation in his blood pressure and heart rate.  He has noted that his blood pressures on the lower side he does feel more tired and without energy.  He does have a log of his blood pressure and heart rate.  Advised to discuss with primary care and cardiologist.  He did injure his left leg while cutting firewood.  It is swollen and bruised and erythematous.  However he does have pedal pulses in his able to walk and move the left leg without any limitations.  He denies any paresthesias or difficulty ambulating.  Advised cold compresses elevating leg compression stockings and follow-up with PCP if symptoms do not improve over the next 3-4 days.    Past Medical History:   Diagnosis Date    Arthritis     Atrial flutter (CMS HCC)     Cancer (CMS HCC)     Dysuria     Enteritis due to Rotavirus     Gout     Headache     Hemorrhoid     Hypercholesterolemia     Hypertension      Past Surgical History:   Procedure Laterality Date    HX HEMORRHOIDECTOMY        Cannot display prior to admission medications because the patient has not been admitted in this contact.          No current facility-administered medications for this visit.    No Known Allergies    Social History     Tobacco Use    Smoking status: Former     Types: Cigars    Smokeless tobacco: Current      Types: Chew, Snuff   Substance Use Topics    Alcohol use: Yes     Alcohol/week: 2.0 standard drinks of alcohol     Types: 2 Cans of beer per week     Comment: few days a week     Family History:  relevant history summarized above in the HPI.   No  other family history of pulmonary significance.     ROS:   Other than ROS in the HPI, all other systems were negative.    EXAM:  Temperature: 36.3 C (97.3 F)  Heart Rate: 50  BP (Non-Invasive): 126/72  Respiratory Rate: 16  SpO2: 95 %  General: Seated, in no acute distress  HEENT: Atraumatic normocephalic.   Neck: Trachea in the midline. Supple.   Cardiovascular: S1-S2 regular. No murmur.  Lungs: Good air entry bilaterally. No rales rhonchi or wheezing.  Extremities: No edema clubbing or cyanosis.  Neurological: Awake, alert , oriented. Nonfocal.  Psychiatric: Normal mood and affect.      Labs:    No results found for this or any previous visit (from the past 24 hour(s)).   Imaging Studies:      CXR:  None on file    CT CHEST:  CTA CHEST 01/26/2018, report on care everywhere, no images available for my personal interpretation and review  COMPARISON:  CTA chest 12/21/2017.   FINDINGS:   No evidence of acute pulmonary embolism.  No thoracic aortic aneurysm or   dissection.   The heart is normal in size without pericardial effusion.  No pathologically   enlarged lymph nodes in the chest.   Elevation of the left hemidiaphragm with likely left basilar atelectasis.  No   pneumothorax, pleural effusion, or consolidation.  Central airways are patent.   Nonobstructing left nephrolithiasis.  Upper abdomen otherwise demonstrates no   acute pathology.   Sternum is intact.  Thoracic vertebral body heights are preserved.  No acute   displaced rib fracture identified.  No suspicious bony lesions.   IMPRESSION:   1.  No evidence of acute pulmonary embolism.   2.  Elevation of the left hemidiaphragm with likely left basilar atelectasis.  No pneumothorax, pleural effusion, or  consolidation.   3.  Nonobstructing left nephrolithiasis.     PULMONARY FUNCTION TESTING  03/13/2021  RESULTS:  FEV1/FVC ratio is normal at 71%.  FEV1 is normal at 84% of predicted.  FVC is normal at 88% predicted.  Following albuterol, there is no significant improvement in spirometry.    Total lung capacity is normal at 116% of predicted.  Residual volume is increased at 155% of predicted.  Slow vital capacity is normal at 94% of predicted.  Diffusion capacity is reduced at 67% of predicted.  Flow volume loop is obstructed in appearance.  IMPRESSION:  Normal spirometry.    No significant improvement in spirometry following albuterol   Air trapping as indicated by increased residual volume 155% predicted.  Decreased diffusion capacity at 67% predicted, with no significant correction when considering alveolar volume   Though spirometry component is normal, the patient's lung volumes indicate air trapping, diffusion capacity is decreased and flow volume lopo is obstructed appearance.  This pattern suggests an underlying obstructive pulmonary limitation.  Further clinical and radiographic correlation is advised.    Spirometry 01/28/2018      Data:   Spirometry: The FVC is 3.6 L or 81% predicted which is normal.  The FEV1 is 2.36 L or 71% predicted which is mildly reduced.  The FEV1 to FVC ratio is 0.66 which is low.  MVV is 82 L/min which is what is expected for this FEV1.   Interpretation: There is mild airflow obstruction.  There is no previous spirometry to compare this to.     Total time spent on encounter was 20 minutes. In addition to face to face time with the patient  performing history and exam, this includes time spent before and after the visit reviewing records, personally reviewing prior imaging, reviewing prior cardiac studies as available, reviewing prior outpatient notes as available, reviewing the patient's medication record, coordinating with office staff and time spent performing final documentation  in the medical record.    Lacretia Leigh, MD

## 2022-10-14 ENCOUNTER — Telehealth: Payer: Self-pay | Admitting: Internal Medicine

## 2022-10-14 NOTE — Telephone Encounter (Signed)
Brandy with [facility] called states physical therapy was with patient yesterday and every time patient walked his oxygen levels would drop in the 80's but when sitting it would be in the 90's. States patient has plus 1 in his legs, and lungs sound like there is fluid at the bases. States patient was told to double his torsemide but he did not do it yesterday but did do it today. Patient is scheduled for a follow up on 10/19/22. If any questions Gearldine Bienenstock can be reached at [phone].

## 2022-10-14 NOTE — Telephone Encounter (Signed)
Contacted Tanner [name] to schedule their annual wellness visit. Appointment made for 10/28/2022.  [facility] Care Guide [facility] AW[facility] TEAM Direct Dial: [phone]

## 2022-10-14 NOTE — Telephone Encounter (Signed)
HH ORDERS   Caller Name: [facility] Agency Name: Glynda Jaeger Phone #: [phone]  Service Requested: PT (examples: OT/PT/Skilled Nursing/Social Work/Speech Therapy/Wound Care)  Frequency of Visits: 2X a week for 3 weeks, 1X a week for 5 weeks

## 2022-10-15 DIAGNOSIS — R635 Abnormal weight gain: Secondary | ICD-10-CM | POA: Diagnosis not present

## 2022-10-15 DIAGNOSIS — J810 Acute pulmonary edema: Secondary | ICD-10-CM | POA: Diagnosis not present

## 2022-10-15 DIAGNOSIS — I517 Cardiomegaly: Secondary | ICD-10-CM | POA: Diagnosis not present

## 2022-10-15 DIAGNOSIS — R0602 Shortness of breath: Secondary | ICD-10-CM | POA: Diagnosis not present

## 2022-10-15 DIAGNOSIS — N3946 Mixed incontinence: Secondary | ICD-10-CM | POA: Diagnosis not present

## 2022-10-15 DIAGNOSIS — R609 Edema, unspecified: Secondary | ICD-10-CM | POA: Diagnosis not present

## 2022-10-15 NOTE — Telephone Encounter (Signed)
Ok for verbals 

## 2022-10-15 NOTE — Telephone Encounter (Signed)
Notified Laura w/ MD response../lmb ?

## 2022-10-16 ENCOUNTER — Ambulatory Visit (INDEPENDENT_AMBULATORY_CARE_PROVIDER_SITE_OTHER): Payer: Commercial Managed Care - PPO | Admitting: PHYSICIAN ASSISTANT

## 2022-10-16 ENCOUNTER — Inpatient Hospital Stay
Admission: RE | Admit: 2022-10-16 | Discharge: 2022-10-16 | Disposition: A | Discharge status: Home / Self Care | Payer: Commercial Managed Care - PPO | Source: Ambulatory Visit | Attending: PHYSICIAN ASSISTANT | Admitting: PHYSICIAN ASSISTANT

## 2022-10-16 ENCOUNTER — Other Ambulatory Visit: Payer: Self-pay

## 2022-10-16 ENCOUNTER — Telehealth (INDEPENDENT_AMBULATORY_CARE_PROVIDER_SITE_OTHER): Payer: Self-pay | Admitting: PHYSICIAN ASSISTANT

## 2022-10-16 ENCOUNTER — Encounter (INDEPENDENT_AMBULATORY_CARE_PROVIDER_SITE_OTHER): Payer: Self-pay | Admitting: PHYSICIAN ASSISTANT

## 2022-10-16 VITALS — BP 130/62 | HR 61 | Wt 237.0 lb

## 2022-10-16 DIAGNOSIS — L0390 Cellulitis, unspecified: Secondary | ICD-10-CM

## 2022-10-16 DIAGNOSIS — R2242 Localized swelling, mass and lump, left lower limb: Secondary | ICD-10-CM | POA: Insufficient documentation

## 2022-10-16 MED ORDER — DOXYCYCLINE HYCLATE 100 MG CAPSULE
100.0000 mg | ORAL_CAPSULE | Freq: Two times a day (BID) | ORAL | 0 refills | Status: AC
Start: 2022-10-16 — End: 2022-10-26

## 2022-10-16 NOTE — Progress Notes (Signed)
PRIMARY CARE, [facility]  [address] [place] [address]       Name: Thomas [name] MRN:  [id number]   Date: 10/16/2022 Age: 77 y.o.        Thomas [name] is a 77 y.o. male who presents to the office today for     Chief Complaint   Patient presents with    ED Follow-up          Subjective:   Thomas [name] is a 77 y.o. male who presents to the office today for ER follow up. Patient had presented to [facility] on 10/09/22 for swelling and tenderness of left leg in the upper shin area that had been going on for the past two weeks. He is on Eliquis for a fib. Exam revealed hematoma. Ultrasound ordered to r/o DVT which showed no evidence of DVT within left lower extremity, suboptimal visualization of the left calf veins due to edema and large hematoma within the area of bruising within the left anterior proximal calf. Patient reports he is still having pain which is now primarily located in the back of the calf. He denies any injury or trauma prior to all of this. He notes before symptoms had started he was out cutting firewood and may have bumped his leg off of something without noticing. He woke up this morning with erythema. He had cellulitis previously diagnosed in February that improved after second antibiotic. No sob, chest pain.       Review of Systems   Constitutional:  Negative for chills and fever.   Respiratory:  Negative for cough and shortness of breath.    Cardiovascular:  Negative for chest pain.   Musculoskeletal:         Left lower extremity swelling and erythema.    Neurological:  Negative for tingling.        Past Medical History:   Diagnosis Date    Arthritis     Atrial flutter (CMS HCC)     Cancer (CMS HCC)     Dysuria     Enteritis due to Rotavirus     Gout     Headache     Hemorrhoid     Hypercholesterolemia     Hypertension             Current Outpatient Medications   Medication Sig    acetaminophen (TYLENOL) 500 mg Oral Tablet Take 1 Tablet (500 mg total)  by mouth Every 4 hours as needed for Pain (pt takes tylenol pm)    alfuzosin (UROXATRAL) 10 mg Oral Tablet Sustained Release 24 hr TAKE ONE TABLET BY MOUTH EVERY DAY    apixaban (ELIQUIS) 5 mg Oral Tablet Take 1 Tablet (5 mg total) by mouth Twice daily SHIP TO PATIENT,   [id number]  STATE LICENSE- [id number]  NPI- [id number]    diltiazem HCl (TIAZAC) 240 mg Oral Capsule,Sustained Action 24 hr TAKE ONE CAPSULE BY MOUTH DAILY    doxycycline hyclate (VIBRAMYCIN) 100 mg Oral Capsule Take 1 Capsule (100 mg total) by mouth Twice daily for 10 days    finasteride (PROSCAR) 5 mg Oral Tablet TAKE ONE TABLET BY MOUTH EVERY DAY    flecainide (TAMBOCOR) 100 mg Oral Tablet TAKE ONE TABLET BY MOUTH TWICE DAILY    furosemide (LASIX) 40 mg Oral Tablet TAKE ONE TABLET BY MOUTH EVERY DAY    losartan (COZAAR) 50 mg Oral Tablet Take 1 Tablet (50 mg total) by mouth Once a day  nitroGLYCERIN (NITROSTAT) 0.4 mg Sublingual Tablet, Sublingual Place 1 Tablet (0.4 mg total) under the tongue Every 5 minutes as needed for Chest pain    potassium chloride (KLOR-CON) 10 mEq Oral Tablet Sustained Release TAKE ONE TABLET BY MOUTH EVERY DAY    rosuvastatin (CRESTOR) 10 mg Oral Tablet Take 1 Tablet (10 mg total) by mouth Every evening    SPIRIVA WITH HANDIHALER 18 mcg Inhalation Capsule, w/Inhalation Device Take 1 Capsule (18 mcg total) by inhalation Once a day         Objective:     BP 130/62 (Site: Left, Patient Position: Sitting)   Pulse 61   Wt 108 kg (237 lb)   SpO2 95%   BMI 33.05 kg/m         Physical Exam  Constitutional:       General: He is not in acute distress.     Appearance: He is not ill-appearing or toxic-appearing.   HENT:      Head: Normocephalic.   Cardiovascular:      Rate and Rhythm: Normal rate and regular rhythm.      Heart sounds: No murmur heard.  Pulmonary:      Effort: Pulmonary effort is normal. No respiratory distress.      Breath sounds: Normal breath sounds.   Musculoskeletal:      Left lower leg: Edema  present.      Comments: 1+ left lower extremity edema with erythema extending from below the knee to ankle. Large 3 cm swelling over anterior shin inferior to patella consistent with hematoma. Tenderness to palpation of posterior calf.    Neurological:      General: No focal deficit present.      Mental Status: He is alert and oriented to person, place, and time.   Psychiatric:         Mood and Affect: Mood normal.         Behavior: Behavior normal.            Assessment/Plan:     1. Localized swelling of left lower extremity  - doxycycline hyclate (VIBRAMYCIN) 100 mg Oral Capsule; Take 1 Capsule (100 mg total) by mouth Twice daily for 10 days  Dispense: 20 Capsule; Refill: 0  - Peripheral Venous Duplex - Lower; Future    2. Cellulitis, unspecified cellulitis site  - doxycycline hyclate (VIBRAMYCIN) 100 mg Oral Capsule; Take 1 Capsule (100 mg total) by mouth Twice daily for 10 days  Dispense: 20 Capsule; Refill: 0      Patient is aware to call the office with any new or worsening symptoms. Will order stat duplex given his symptoms. He is on Eliquis but did take half dose for three doses after ER visit as he states he was instructed by the ER provider to do this because of his hematoma. He state he resumed his full dose twice daily on Sunday. No hx of blood clot in the past. He does have evidence of cellulitis on exam and will treat with doxycycline as he failed keflex previously in February. We will make further recommendations pending results. If he has DVT despite Eliquis use will need to report to ER for management.     The supervising/collaborating physician on site for this visit was Dr. Romeo Apple.    Thomas Evans, PA-C  10/16/2022

## 2022-10-16 NOTE — Telephone Encounter (Signed)
Spoke with Pt who says that he "didn't say he had a blood clot" but says that the pain as moved from the front of his leg to the back. Advised Pt to report to ER if he feels this is truly a blood clot, to which he says "he doesn't know if it is a blood clot, or something else, but would like to have it checked out." Informed Pt that if blood clot is suspected at office visit, that he will be referred to the ER anyway. Pt is okay with this and wishes to keep appt.

## 2022-10-16 NOTE — Nursing Note (Signed)
Pt was at [facility] for [facility]. Pt states new pain radiating from front to back of the calf, its now red and swollen. Also has bruising.

## 2022-10-19 ENCOUNTER — Encounter: Payer: Self-pay | Admitting: Internal Medicine

## 2022-10-19 ENCOUNTER — Ambulatory Visit (INDEPENDENT_AMBULATORY_CARE_PROVIDER_SITE_OTHER): Payer: Medicare HMO | Admitting: Internal Medicine

## 2022-10-19 ENCOUNTER — Encounter (HOSPITAL_COMMUNITY): Payer: Self-pay | Admitting: Radiology

## 2022-10-19 ENCOUNTER — Telehealth (INDEPENDENT_AMBULATORY_CARE_PROVIDER_SITE_OTHER): Payer: Self-pay | Admitting: PHYSICIAN ASSISTANT

## 2022-10-19 VITALS — BP 160/80 | HR 76 | Temp 98.2°F | Ht 64.5 in | Wt 240.0 lb

## 2022-10-19 DIAGNOSIS — J9601 Acute respiratory failure with hypoxia: Secondary | ICD-10-CM

## 2022-10-19 DIAGNOSIS — Z86711 Personal history of pulmonary embolism: Secondary | ICD-10-CM | POA: Diagnosis not present

## 2022-10-19 DIAGNOSIS — I5022 Chronic systolic (congestive) heart failure: Secondary | ICD-10-CM | POA: Diagnosis not present

## 2022-10-19 NOTE — Patient Instructions (Addendum)
Stay on the torsemide 20 mg daily and let us know if the fluid comes back.

## 2022-10-19 NOTE — Progress Notes (Unsigned)
   Subjective:   Patient ID: Tanner [name], male    DOB: 11/17/1945, 77 y.o.   MRN: [id number]  HPI The patient is a 77 YO man coming in for low oxygen levels with activity with PT.   Review of Systems  Constitutional:  Positive for fatigue.  HENT: Negative.    Eyes: Negative.   Respiratory:  Negative for cough, chest tightness and shortness of breath.   Cardiovascular:  Negative for chest pain, palpitations and leg swelling.  Gastrointestinal:  Negative for abdominal distention, abdominal pain, constipation, diarrhea, nausea and vomiting.  Musculoskeletal:  Positive for arthralgias.  Skin: Negative.   Neurological: Negative.   Psychiatric/Behavioral: Negative.      Objective:  Physical Exam Constitutional:      Appearance: He is well-developed. He is obese. He is ill-appearing.  HENT:     Head: Normocephalic and atraumatic.  Cardiovascular:     Rate and Rhythm: Normal rate and regular rhythm.  Pulmonary:     Effort: Pulmonary effort is normal. No respiratory distress.     Breath sounds: Normal breath sounds. No wheezing or rales.  Abdominal:     General: Bowel sounds are normal. There is no distension.     Palpations: Abdomen is soft.     Tenderness: There is no abdominal tenderness. There is no rebound.  Musculoskeletal:     Cervical back: Normal range of motion.     Right lower leg: Edema present.     Left lower leg: Edema present.     Comments: Chronic stable bilateral leg edema with venous stasis changes  Skin:    General: Skin is warm and dry.  Neurological:     Mental Status: He is alert and oriented to person, place, and time.     Coordination: Coordination abnormal.     Comments: Slow gait but steady     Vitals:   10/19/22 1540 10/19/22 1543  BP: (!) 160/80 (!) 160/80  Pulse: 82 76  Temp: 98.2 F (36.8 C)   TempSrc: Oral   SpO2: (!) 84% 96%  Weight: 240 lb (108.9 kg)   Height: 5' 4.5" (1.638 m)     Assessment & Plan:

## 2022-10-19 NOTE — Telephone Encounter (Signed)
-----   Message from [place], [place] sent at 10/19/2022  9:31 AM EDT -----  Please let patient know that ultrasound did not show DVT and no significant change or previous ultrasound he had. How is he doing?

## 2022-10-19 NOTE — Telephone Encounter (Signed)
Pt aware of U/S results, states he hasn't had much change in Sx since his previous visit. Pt advised to monitor Sx and call office with any changes/Questions.

## 2022-10-20 ENCOUNTER — Other Ambulatory Visit: Payer: Self-pay | Admitting: Internal Medicine

## 2022-10-20 NOTE — Telephone Encounter (Signed)
Pls advise ok to refill. Med was rx by hospitalist../lb

## 2022-10-20 NOTE — Telephone Encounter (Signed)
Prescription Request  10/20/2022  LOV: 10/19/2022  What is the name of the medication or equipment? amiodarone (PACERONE) 200 MG tablet   Have you contacted your pharmacy to request a refill? No   Which pharmacy would you like this sent to?  [id number] [place], [place] - [address] [place] [address] Phone: [phone] Fax: [phone]    Patient notified that their request is being sent to the clinical staff for review and that they should receive a response within 2 business days.   Please advise at Mobile [phone] (mobile)

## 2022-10-20 NOTE — Telephone Encounter (Signed)
Cardiology should prescribe ok to forward to them

## 2022-10-21 ENCOUNTER — Telehealth: Payer: Self-pay | Admitting: Internal Medicine

## 2022-10-21 ENCOUNTER — Ambulatory Visit: Payer: Self-pay

## 2022-10-21 MED ORDER — AMIODARONE HCL 200 MG PO TABS: 200.0000 mg | ORAL_TABLET | Freq: Every day | ORAL | 3 refills | Status: DC

## 2022-10-21 NOTE — Telephone Encounter (Signed)
Pt's medication was sent to pt's pharmacy as requested. Confirmation received.  °

## 2022-10-21 NOTE — Telephone Encounter (Signed)
*  STAT* If patient is at the pharmacy, call can be transferred to refill team.   1. Which medications need to be refilled? (please list name of each medication and dose if known) amiodarone (PACERONE) 200 MG tablet   2. Which pharmacy/location (including street and city if local pharmacy) is medication to be sent to?  [id number] - [place], [place] - [address]    3. Do they need a 30 day or 90 day supply? 90

## 2022-10-21 NOTE — Telephone Encounter (Signed)
Please refill for patient.

## 2022-10-21 NOTE — Patient Outreach (Signed)
  Care Coordination   Follow Up Visit Note   10/21/2022 Name: Tanner [name] MRN: [id number] DOB: 05/25/1946  Tanner [name] is a 77 y.o. year old male who sees Tanner Broker, MD for primary care. I spoke with  Tanner [name] by phone today.  What matters to the patients health and wellness today?  Tanner [name] reports recent fall, but states he did not get hurt. He states- tripped over a rug at the front door. Tanner [name] reports provider aware. He is active with home health therapy. Office visit with PCP 10/19/22. Tanner [name] reports he is in need of a refill on amiodarone. He continues to be followed at the [place] and reports next appointment 10/26/22.   Goals Addressed             This Visit's Progress    continue to improve post hospitalization       Interventions Today    Flowsheet Row Most Recent Value  Chronic Disease   Chronic disease during today's visit Congestive Heart Failure (CHF), Hypertension (HTN)  General Interventions   General Interventions Discussed/Reviewed General Interventions Reviewed, Doctor Visits  Doctor Visits Discussed/Reviewed Doctor Visits Discussed  [discussed reviewed upcoming scheduled appointments]  Education Interventions   Education Provided Provided Education  Provided Verbal Education On Medication  [encouraged to continue to take medications as prescribed and attend provider visit as scheduled]  Nutrition Interventions   Nutrition Discussed/Reviewed Nutrition Reviewed  Pharmacy Interventions   Pharmacy Dicussed/Reviewed Pharmacy Topics Reviewed  [extensive medication review completed. per chart review amiodarone prescribed while in hospital. advised patient to follow up with cardiologist regarding amiodarone refill.]  Safety Interventions   Safety Discussed/Reviewed Fall Risk, Safety Reviewed  [discussed fall prevention strategies]            SDOH assessments and interventions completed:  No  Care Coordination Interventions:  Yes, provided    Follow up plan: Follow up call scheduled for 12/03/22    Encounter Outcome:  Pt. Visit Completed   Tanner Sheriff, RN, MSN, BSN, CCM [facility] Care Coordinator [phone]

## 2022-10-21 NOTE — Addendum Note (Signed)
Addended by: Deatra James on: 10/21/2022 11:13 AM   Modules accepted: Orders

## 2022-10-21 NOTE — Patient Instructions (Signed)
Visit Information  Thank you for taking time to visit with me today. Please don't hesitate to contact me if I can be of assistance to you.   Following are the goals we discussed today:  Continue to take Medications as scheduled Continues to be perform exercises recommended by home health therapist Reminder of fall prevention strategies discussed Call your pharmacy to check to see if amiodarone medication refill is complete. If not complete contact your cardiologist.   Our next appointment is by telephone on 12/03/22 at 1:30 pm  Please call the care guide team at [phone] if you need to cancel or reschedule your appointment.   If you are experiencing a Mental Health or Behavioral Health Crisis or need someone to talk to, please call the Suicide and Crisis Lifeline: 54   Kathyrn Sheriff, RN, MSN, BSN, CCM [facility] Care Coordinator [phone]

## 2022-10-22 ENCOUNTER — Telehealth: Payer: Self-pay | Admitting: Internal Medicine

## 2022-10-22 DIAGNOSIS — J9601 Acute respiratory failure with hypoxia: Secondary | ICD-10-CM | POA: Insufficient documentation

## 2022-10-22 NOTE — Assessment & Plan Note (Signed)
We did discuss that he does qualify at this time for home oxygen with activity. With rest he does not need this. He does not desire this. We did discuss I recommend working with PT/OT to increase stamina and this may resolve on its own as he previously did not have this. He has had several medical conditions which have caused deconditioning lately. We agree together to observe his levels and work with PT despite low O2 levels.

## 2022-10-22 NOTE — Assessment & Plan Note (Signed)
Taking eliquis 5 mg BID and no evidence for recurrence.

## 2022-10-22 NOTE — Telephone Encounter (Signed)
Devon with [facility] home health called stated there was a schedule conflict so patient received home health and physical therapy today. Best callback number is [phone].

## 2022-10-22 NOTE — Assessment & Plan Note (Signed)
No signs of volume overload today. Chronic leg edema. He understands that having low oxygen levels with activity can put more strain on his heart. Taking jardiance 10 mg daily and torsemide 20 mg daily and more as needed for weight gain.

## 2022-10-24 ENCOUNTER — Inpatient Hospital Stay
Admission: RE | Admit: 2022-10-24 | Discharge: 2022-10-24 | Disposition: A | Discharge status: Home / Self Care | Payer: Commercial Managed Care - PPO | Source: Ambulatory Visit | Attending: ORTHOPAEDIC SURGERY | Admitting: ORTHOPAEDIC SURGERY

## 2022-10-24 ENCOUNTER — Other Ambulatory Visit: Payer: Self-pay

## 2022-10-24 DIAGNOSIS — M4802 Spinal stenosis, cervical region: Secondary | ICD-10-CM | POA: Insufficient documentation

## 2022-10-24 DIAGNOSIS — M5030 Other cervical disc degeneration, unspecified cervical region: Secondary | ICD-10-CM | POA: Insufficient documentation

## 2022-10-24 DIAGNOSIS — M542 Cervicalgia: Secondary | ICD-10-CM | POA: Insufficient documentation

## 2022-10-26 ENCOUNTER — Encounter: Payer: Medicare HMO | Admitting: Licensed Clinical Social Worker

## 2022-10-26 ENCOUNTER — Telehealth (HOSPITAL_COMMUNITY): Payer: Self-pay | Admitting: ORTHOPAEDIC SURGERY

## 2022-10-26 DIAGNOSIS — M4802 Spinal stenosis, cervical region: Secondary | ICD-10-CM

## 2022-10-26 NOTE — Telephone Encounter (Signed)
Patient's CT scan shows severe facet disease at C2-3 and C3-4 on the right side consistent with his pain.  There are erosive arthritic changes there.  This is unlikely to be infectious.  I called him and discussed the findings with him.  Currently his pain is down to about a 1/10 and he is heading out to play golf.  If his symptoms worsen he would be a good candidate for some injections in those joints.  He will call me if that happens.

## 2022-10-27 ENCOUNTER — Telehealth: Payer: Self-pay

## 2022-10-27 ENCOUNTER — Ambulatory Visit (INDEPENDENT_AMBULATORY_CARE_PROVIDER_SITE_OTHER): Payer: Medicare HMO

## 2022-10-27 VITALS — Ht 64.5 in | Wt 240.0 lb

## 2022-10-27 DIAGNOSIS — Z0000 Encounter for general adult medical examination without abnormal findings: Secondary | ICD-10-CM | POA: Diagnosis not present

## 2022-10-27 NOTE — Patient Instructions (Addendum)
Tanner [name] , Thank you for taking time to come for your Medicare Wellness Visit. I appreciate your ongoing commitment to your health goals. Please review the following plan we discussed and let me know if I can assist you in the future.   These are the goals we discussed:  Goals      continue to improve post hospitalization     Interventions Today    Flowsheet Row Most Recent Value  Chronic Disease   Chronic disease during today's visit Congestive Heart Failure (CHF), Hypertension (HTN)  General Interventions   General Interventions Discussed/Reviewed General Interventions Reviewed, Doctor Visits  Doctor Visits Discussed/Reviewed Doctor Visits Discussed  [discussed reviewed upcoming scheduled appointments]  Education Interventions   Education Provided Provided Education  Provided Verbal Education On Medication  [encouraged to continue to take medications as prescribed and attend provider visit as scheduled]  Nutrition Interventions   Nutrition Discussed/Reviewed Nutrition Reviewed  Pharmacy Interventions   Pharmacy Dicussed/Reviewed Pharmacy Topics Reviewed  [extensive medication review completed. per chart review amiodarone prescribed while in hospital. advised patient to follow up with cardiologist regarding amiodarone refill.]  Safety Interventions   Safety Discussed/Reviewed Fall Risk, Safety Reviewed  [discussed fall prevention strategies]           decrease loneness and manage health     Activities and task to complete in order to accomplish goals.   Keep all upcoming appointment discussed today ( 10/01/22 appointment with Dr. Jonny Ruiz) Continue with compliance of taking medication prescribed by Doctor Self Support options  (use walker)      My goal for 2024 is to get my knee fixed.        This is a list of the screening recommended for you and due dates:  Health Maintenance  Topic Date Due   COVID-19 Vaccine (5 - 2023-24 season) 02/13/2022   Colon Cancer Screening   11/25/2022*   Eye exam for diabetics  04/21/2023*   Yearly kidney health urinalysis for diabetes  12/05/2022   Complete foot exam   12/05/2022   Flu Shot  01/14/2023   Hemoglobin A1C  02/19/2023   Yearly kidney function blood test for diabetes  10/01/2023   Medicare Annual Wellness Visit  10/27/2023   DTaP/Tdap/Td vaccine (4 - Td or Tdap) 04/15/2030   Pneumonia Vaccine  Completed   Hepatitis C Screening: USPSTF Recommendation to screen - Ages 78-79 yo.  Completed   Zoster (Shingles) Vaccine  Completed   HPV Vaccine  Aged Out  *Topic was postponed. The date shown is not the original due date.    Advanced directives: Yes  Conditions/risks identified: Yes  Next appointment: Follow up in one year for your annual wellness visit.   Preventive Care 78 Years and Older, Male  Preventive care refers to lifestyle choices and visits with your health care provider that can promote health and wellness. What does preventive care include? A yearly physical exam. This is also called an annual well check. Dental exams once or twice a year. Routine eye exams. Ask your health care provider how often you should have your eyes checked. Personal lifestyle choices, including: Daily care of your teeth and gums. Regular physical activity. Eating a healthy diet. Avoiding tobacco and drug use. Limiting alcohol use. Practicing safe sex. Taking low doses of aspirin every day. Taking vitamin and mineral supplements as recommended by your health care provider. What happens during an annual well check? The services and screenings done by your health care provider during your annual well  check will depend on your age, overall health, lifestyle risk factors, and family history of disease. Counseling  Your health care provider may ask you questions about your: Alcohol use. Tobacco use. Drug use. Emotional well-being. Home and relationship well-being. Sexual activity. Eating habits. History of  falls. Memory and ability to understand (cognition). Work and work Astronomer. Screening  You may have the following tests or measurements: Height, weight, and BMI. Blood pressure. Lipid and cholesterol levels. These may be checked every 5 years, or more frequently if you are over 8 years old. Skin check. Lung cancer screening. You may have this screening every year starting at age 78 if you have a 30-pack-year history of smoking and currently smoke or have quit within the past 15 years. Fecal occult blood test (FOBT) of the stool. You may have this test every year starting at age 78. Flexible sigmoidoscopy or colonoscopy. You may have a sigmoidoscopy every 5 years or a colonoscopy every 10 years starting at age 78. Prostate cancer screening. Recommendations will vary depending on your family history and other risks. Hepatitis C blood test. Hepatitis B blood test. Sexually transmitted disease (STD) testing. Diabetes screening. This is done by checking your blood sugar (glucose) after you have not eaten for a while (fasting). You may have this done every 1-3 years. Abdominal aortic aneurysm (AAA) screening. You may need this if you are a current or former smoker. Osteoporosis. You may be screened starting at age 78 if you are at high risk. Talk with your health care provider about your test results, treatment options, and if necessary, the need for more tests. Vaccines  Your health care provider may recommend certain vaccines, such as: Influenza vaccine. This is recommended every year. Tetanus, diphtheria, and acellular pertussis (Tdap, Td) vaccine. You may need a Td booster every 10 years. Zoster vaccine. You may need this after age 78. Pneumococcal 13-valent conjugate (PCV13) vaccine. One dose is recommended after age 78. Pneumococcal polysaccharide (PPSV23) vaccine. One dose is recommended after age 78. Talk to your health care provider about which screenings and vaccines you need and  how often you need them. This information is not intended to replace advice given to you by your health care provider. Make sure you discuss any questions you have with your health care provider. Document Released: 06/28/2015 Document Revised: 02/19/2016 Document Reviewed: 04/02/2015 Elsevier Interactive Patient Education  2017 [employer].  Fall Prevention in the Home Falls can cause injuries. They can happen to people of all ages. There are many things you can do to make your home safe and to help prevent falls. What can I do on the outside of my home? Regularly fix the edges of walkways and driveways and fix any cracks. Remove anything that might make you trip as you walk through a door, such as a raised step or threshold. Trim any bushes or trees on the path to your home. Use bright outdoor lighting. Clear any walking paths of anything that might make someone trip, such as rocks or tools. Regularly check to see if handrails are loose or broken. Make sure that both sides of any steps have handrails. Any raised decks and porches should have guardrails on the edges. Have any leaves, snow, or ice cleared regularly. Use sand or salt on walking paths during winter. Clean up any spills in your garage right away. This includes oil or grease spills. What can I do in the bathroom? Use night lights. Install grab bars by the toilet and  in the tub and shower. Do not use towel bars as grab bars. Use non-skid mats or decals in the tub or shower. If you need to sit down in the shower, use a plastic, non-slip stool. Keep the floor dry. Clean up any water that spills on the floor as soon as it happens. Remove soap buildup in the tub or shower regularly. Attach bath mats securely with double-sided non-slip rug tape. Do not have throw rugs and other things on the floor that can make you trip. What can I do in the bedroom? Use night lights. Make sure that you have a light by your bed that is easy to  reach. Do not use any sheets or blankets that are too big for your bed. They should not hang down onto the floor. Have a firm chair that has side arms. You can use this for support while you get dressed. Do not have throw rugs and other things on the floor that can make you trip. What can I do in the kitchen? Clean up any spills right away. Avoid walking on wet floors. Keep items that you use a lot in easy-to-reach places. If you need to reach something above you, use a strong step stool that has a grab bar. Keep electrical cords out of the way. Do not use floor polish or wax that makes floors slippery. If you must use wax, use non-skid floor wax. Do not have throw rugs and other things on the floor that can make you trip. What can I do with my stairs? Do not leave any items on the stairs. Make sure that there are handrails on both sides of the stairs and use them. Fix handrails that are broken or loose. Make sure that handrails are as long as the stairways. Check any carpeting to make sure that it is firmly attached to the stairs. Fix any carpet that is loose or worn. Avoid having throw rugs at the top or bottom of the stairs. If you do have throw rugs, attach them to the floor with carpet tape. Make sure that you have a light switch at the top of the stairs and the bottom of the stairs. If you do not have them, ask someone to add them for you. What else can I do to help prevent falls? Wear shoes that: Do not have high heels. Have rubber bottoms. Are comfortable and fit you well. Are closed at the toe. Do not wear sandals. If you use a stepladder: Make sure that it is fully opened. Do not climb a closed stepladder. Make sure that both sides of the stepladder are locked into place. Ask someone to hold it for you, if possible. Clearly mark and make sure that you can see: Any grab bars or handrails. First and last steps. Where the edge of each step is. Use tools that help you move  around (mobility aids) if they are needed. These include: Canes. Walkers. Scooters. Crutches. Turn on the lights when you go into a dark area. Replace any light bulbs as soon as they burn out. Set up your furniture so you have a clear path. Avoid moving your furniture around. If any of your floors are uneven, fix them. If there are any pets around you, be aware of where they are. Review your medicines with your doctor. Some medicines can make you feel dizzy. This can increase your chance of falling. Ask your doctor what other things that you can do to help prevent falls. This  information is not intended to replace advice given to you by your health care provider. Make sure you discuss any questions you have with your health care provider. Document Released: 03/28/2009 Document Revised: 11/07/2015 Document Reviewed: 07/06/2014 Elsevier Interactive Patient Education  2017 [employer].

## 2022-10-27 NOTE — Telephone Encounter (Signed)
No ? Just FYI...Tanner [name]

## 2022-10-27 NOTE — Progress Notes (Signed)
I connected with  [name] on 10/27/22 by a audio enabled telemedicine application and verified that I am speaking with the correct person using two identifiers.  Patient Location: Home  Provider Location: Office/Clinic  I discussed the limitations of evaluation and management by telemedicine. The patient expressed understanding and agreed to proceed.  Subjective:   Tanner [name] is a 77 y.o. male who presents for Medicare Annual/Subsequent preventive examination.  Review of Systems     Cardiac Risk Factors include: advanced age (>43men, >63 women);diabetes mellitus;family history of premature cardiovascular disease;hypertension;dyslipidemia;male gender;obesity (BMI >30kg/m2);sedentary lifestyle     Objective:    Today's Vitals   10/27/22 1308  Weight: 240 lb (108.9 kg)  Height: 5' 4.5" (1.638 m)  PainSc: 0-No pain   Body mass index is 40.56 kg/m.     10/27/2022    1:11 PM 08/18/2022    3:02 PM 06/17/2022   10:23 AM 05/28/2022    4:14 PM 05/04/2022   10:11 AM 05/01/2022   11:55 AM 02/06/2022   11:47 AM  Advanced Directives  Does Patient Have a Medical Advance Directive? Yes Yes No No Yes Yes Yes  Type of Estate agent of [place];Living will Living will   Healthcare Power of [place];Living will Healthcare Power of [place];Living will Healthcare Power of [place];Living will  Does patient want to make changes to medical advance directive?  No - Patient declined     No - Patient declined  Copy of Healthcare Power of Attorney in Chart? No - copy requested      No - copy requested  Would patient like information on creating a medical advance directive?   No - Patient declined No - Patient declined       Current Medications (verified) Outpatient Encounter Medications as of 10/27/2022  Medication Sig   acetaminophen (TYLENOL) 500 MG tablet Take 1,000 mg by mouth every 6 (six) hours as needed for moderate pain.   acidophilus (RISAQUAD) CAPS capsule  Take 1 capsule by mouth daily.   amiodarone (PACERONE) 200 MG tablet Take 1 tablet (200 mg total) by mouth daily.   apixaban (ELIQUIS) 5 MG TABS tablet Take 5 mg by mouth 2 (two) times daily.   atorvastatin (LIPITOR) 80 MG tablet Take 1 tablet (80 mg total) by mouth daily.   buPROPion (WELLBUTRIN SR) 150 MG 12 hr tablet Take 150 mg by mouth 2 (two) times daily.   carvedilol (COREG) 6.25 MG tablet Take 1 tablet (6.25 mg total) by mouth 2 (two) times daily with a meal.   clobetasol ointment (TEMOVATE) 0.05 % Apply 1 Application topically daily as needed.   empagliflozin (JARDIANCE) 10 MG TABS tablet Take 1 tablet (10 mg total) by mouth daily.   finasteride (PROSCAR) 5 MG tablet Take 5 mg by mouth at bedtime.   gabapentin (NEURONTIN) 300 MG capsule Take 300-600 mg by mouth See admin instructions. Take 600 mg by mouth three times daily and  300 mg at night   ketoconazole (NIZORAL) 2 % shampoo Apply 1 application  topically every other day.   lidocaine (XYLOCAINE) 2 % solution Use as directed 15 mLs in the mouth or throat as needed for mouth pain.   Menthol-Methyl Salicylate (THERA-GESIC PLUS) CREA Apply 1 application topically at bedtime. Apply to lower back   Multiple Vitamins-Minerals (MULTIVITAMINS THER. W/MINERALS) TABS Take 1 tablet by mouth daily.   nortriptyline (PAMELOR) 10 MG capsule 2 CAPSULES EVERY NIGHT AT BEDTIME (Patient taking differently: Take 20 mg by mouth  at bedtime.)   saccharomyces boulardii (FLORASTOR) 250 MG capsule Take 1 capsule (250 mg total) by mouth 2 (two) times daily.   Semaglutide, 1 MG/DOSE, 4 MG/3ML SOPN Inject 1 mg as directed once a week.   Semaglutide, 2 MG/DOSE, 8 MG/3ML SOPN Inject 2 mg as directed once a week.   Semaglutide,0.25 or 0.5MG /DOS, (OZEMPIC, 0.25 OR 0.5 MG/DOSE,) 2 MG/3ML SOPN INJECT 0.25 MG INTO THE SKIN ONCE A WEEK FOR 28 DAYS, THEN 0.5 MG ONCE A WEEK FOR 28 DAYS.   sildenafil (VIAGRA) 100 MG tablet Take 100 mg by mouth as needed.   torsemide  (DEMADEX) 20 MG tablet Take 1 tablet (20 mg total) by mouth daily.   No facility-administered encounter medications on file as of 10/27/2022.    Allergies (verified) Lisinopril, Amlodipine, and Codeine sulfate   History: Past Medical History:  Diagnosis Date   Acute kidney failure (HCC)    Anxiety    Arthritis    Back pain    BPH (benign prostatic hypertrophy)    Clostridium difficile infection    Clotting disorder (HCC)    Depression    Diabetes (HCC) 12/11/2016   type 2    DVT (deep venous thrombosis) (HCC)    DVT of deep femoral vein, right (HCC) 06/29/2018   Edema, lower extremity    High cholesterol    Hypertension    Joint pain    Low back pain potentially associated with spinal stenosis    Neuromuscular disorder (HCC)    Neuropathy of lower extremity    bilateral   OSA (obstructive sleep apnea)    pt denies    Osteoarthritis    PE (pulmonary embolism)    Pneumonia    hx of x 2    Ventral hernia    Past Surgical History:  Procedure Laterality Date   CARDIOVERSION N/A 08/19/2022   Procedure: CARDIOVERSION;  Surgeon: Meriam Sprague, MD;  Location: [facility];  Service: Cardiovascular;  Laterality: N/A;   EYE SURGERY     HERNIA REPAIR  1999   TOTAL KNEE ARTHROPLASTY Left 08/26/2020   Procedure: LEFT TOTAL KNEE ARTHROPLASTY;  Surgeon: Gean Birchwood, MD;  Location: [facility];  Service: Orthopedics;  Laterality: Left;   Family History  Problem Relation Age of Onset   Diabetes Mother    Pneumonia Mother    Alzheimer's disease Mother    Hyperlipidemia Mother    Thyroid disease Mother    Depression Mother    Other Father 55       Drowned on boating accident   Colon cancer Neg Hx    Colon polyps Neg Hx    Social History   Socioeconomic History   Marital status: Widowed    Spouse name: Not on file   Number of children: 2   Years of education: Not on file   Highest education level: Not on file  Occupational History   Occupation: Retired  Tobacco Use    Smoking status: Former    Packs/day: 2.00    Years: 33.00    Additional pack years: 0.00    Total pack years: 66.00    Types: Cigarettes    Start date: 08/30/1968    Quit date: 07/02/2001    Years since quitting: 21.3   Smokeless tobacco: Never   Tobacco comments:    quit 12 years ago  Vaping Use   Vaping Use: Never used  Substance and Sexual Activity   Alcohol use: Yes    Comment: seldom  Drug use: No   Sexual activity: Not Currently  Other Topics Concern   Not on file  Social History Narrative   [employer]   [employer]-2 years   Married '66    2 sons '68, '72; 3 grandchildren   [employer] doctor.  Retired.    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    Social Determinants of Health   Financial Resource Strain: Low Risk  (10/27/2022)   Overall Financial Resource Strain (CARDIA)    Difficulty of Paying Living Expenses: Not hard at all  Food Insecurity: No Food Insecurity (10/27/2022)   Hunger Vital Sign    Worried About Running Out of Food in the Last Year: Never true    Ran Out of Food in the Last Year: Never true  Transportation Needs: No Transportation Needs (10/27/2022)   PRAPARE - Administrator, Civil Service (Medical): No    Lack of Transportation (Non-Medical): No  Physical Activity: Inactive (10/27/2022)   Exercise Vital Sign    Days of Exercise per Week: 0 days    Minutes of Exercise per Session: 0 min  Stress: No Stress Concern Present (10/27/2022)   [employer] of Occupational Health - Occupational Stress Questionnaire    Feeling of Stress : Not at all  Recent Concern: Stress - Stress Concern Present (09/02/2022)   [employer] of Occupational Health - Occupational Stress Questionnaire    Feeling of Stress : To some extent  Social Connections: Moderately Integrated (10/27/2022)   Social Connection and Isolation Panel [NHANES]    Frequency of Communication with Friends and Family: More than three times a week    Frequency of Social  Gatherings with Friends and Family: Once a week    Attends Religious Services: More than 4 times per year    Active Member of [employer] or Organizations: Yes    Attends Banker Meetings: More than 4 times per year    Marital Status: Widowed    Tobacco Counseling Counseling given: Not Answered Tobacco comments: quit 12 years ago   Clinical Intake:  Pre-visit preparation completed: Yes  Pain : No/denies pain Pain Score: 0-No pain     BMI - recorded: 40.56 Nutritional Status: BMI > 30  Obese Nutritional Risks: None Diabetes: No  How often do you need to have someone help you when you read instructions, pamphlets, or other written materials from your doctor or pharmacy?: 1 - Never What is the last grade level you completed in school?: HSG  Nutrition Risk Assessment:  Has the patient had any N/V/D within the last 2 months?  No  Does the patient have any non-healing wounds?  No  Has the patient had any unintentional weight loss or weight gain?  No   Diabetes:  Is the patient diabetic?  Yes ; controlled by oral medications If diabetic, was a CBG obtained today?  No  Did the patient bring in their glucometer from home?  No  How often do you monitor your CBG's? None.   Financial Strains and Diabetes Management:  Are you having any financial strains with the device, your supplies or your medication? No .  Does the patient want to be seen by Chronic Care Management for management of their diabetes?  No  Would the patient like to be referred to a Nutritionist or for Diabetic Management?  No   Diabetic Exams:  Diabetic Eye Exam: Com[facility] at [facility] Diabetic Foot Exam: Completed 12/04/2021   Interpreter Needed?: No  Information  entered by :: Susie Cassette, LPN.   Activities of Daily Living    10/27/2022    1:14 PM 08/18/2022    3:02 PM  In your present state of health, do you have any difficulty performing the following activities:  Hearing? 0 0  Comment  no hearing   Vision? 0 0  Comment wears readers for fine print   Difficulty concentrating or making decisions? 0 0  Walking or climbing stairs? 0 1  Dressing or bathing? 0 0  Doing errands, shopping? 1 0  Preparing Food and eating ? N   Using the Toilet? N   In the past six months, have you accidently leaked urine? N   Do you have problems with loss of bowel control? N   Managing your Medications? N   Managing your Finances? N   Housekeeping or managing your Housekeeping? N     Patient Care Team: Myrlene Broker, MD as PCP - General (Internal Medicine) Wyline Mood Alben Spittle, MD as PCP - Cardiology (Cardiology) Szabat, Vinnie Level, [facility] (Inactive) (Pharmacist) Soundra Pilon, LCSW as Social Worker (Licensed Clinical Social Worker)  Indicate any recent [employer] you may have received from other than [facility] providers in the past year (date may be approximate).     Assessment:   This is a routine wellness examination for Dontee.  Hearing/Vision screen Hearing Screening - Comments:: Denies hearing difficulties   Vision Screening - Comments:: Wears rx glasses - [facility]   Dietary issues and exercise activities discussed: Current Exercise Habits: The patient does not participate in regular exercise at present, Exercise limited by: cardiac condition(s);orthopedic condition(s);respiratory conditions(s);neurologic condition(s)   Goals Addressed             This Visit's Progress    decrease loneness and manage health       Activities and task to complete in order to accomplish goals.   Keep all upcoming appointment discussed today ( 10/01/22 appointment with Dr. Jonny Ruiz) Continue with compliance of taking medication prescribed by Doctor Self Support options  (use walker)      My goal for 2024 is to get my knee fixed.        Depression Screen    10/27/2022    1:12 PM 10/01/2022    3:10 PM 08/27/2022    9:14 AM 06/24/2022    9:40 AM  05/26/2022    2:02 PM 01/26/2022    1:48 PM 12/04/2021   11:07 AM  PHQ 2/9 Scores  PHQ - 2 Score 0 0 0 0 0 2 0  PHQ- 9 Score 0   0 0 5 0    Fall Risk    10/27/2022    1:12 PM 10/01/2022    3:10 PM 08/21/2022   12:08 PM 06/24/2022    9:40 AM 05/26/2022    2:02 PM  Fall Risk   Falls in the past year? 1 1 1 1 1   Number falls in past yr: 1 1 1  0 0  Injury with Fall? 1 1 1 1  0  Comment   concussion with mechanical fall in late fall/ early winter 2023    Risk for fall due to : History of fall(s);Impaired balance/gait;Orthopedic patient History of fall(s) History of fall(s);Impaired mobility;Impaired balance/gait;Medication side effect    Follow up Education provided;Falls prevention discussed Falls evaluation completed Falls prevention discussed Falls evaluation completed Falls evaluation completed    FALL RISK PREVENTION PERTAINING TO THE HOME:  Any  stairs in or around the home? Yes  If so, are there any without handrails? No  Home free of loose throw rugs in walkways, pet beds, electrical cords, etc? Yes  Adequate lighting in your home to reduce risk of falls? Yes   ASSISTIVE DEVICES UTILIZED TO PREVENT FALLS:  Life alert? No  Use of a cane, walker or w/c? No  Grab bars in the bathroom? Yes  Shower chair or bench in shower? No  Elevated toilet seat or a handicapped toilet? Yes   TIMED UP AND GO:  Was the test performed? No . Telephonic Visit   Cognitive Function:        10/27/2022    1:18 PM 11/25/2021    4:21 PM  6CIT Screen  What Year? 0 points 0 points  What month? 0 points 0 points  What time? 0 points 0 points  Count back from 20 0 points 0 points  Months in reverse 0 points 0 points  Repeat phrase 0 points 0 points  Total Score 0 points 0 points    Immunizations Immunization History  Administered Date(s) Administered   Fluad Quad(high Dose 65+) 02/22/2019, 04/15/2020   H1N1 05/25/2008   Influenza, High Dose Seasonal PF 02/26/2016, 03/17/2017, 02/28/2018    Influenza, Seasonal, Injecte, Preservative Fre 04/02/2009, 04/03/2010, 03/24/2013, 04/06/2014   Influenza,inj,Quad PF,6+ Mos 03/15/2015, 02/25/2021   Influenza-Unspecified 02/16/2008, 03/11/2011, 05/06/2012, 02/14/2015, 03/31/2016, 03/15/2017, 02/22/2019, 04/29/2022   Moderna Sars-Covid-2 Vaccination 07/31/2019, 08/29/2019, 05/07/2020, 01/20/2021   Pneumococcal Conjugate-13 09/11/2013, 03/15/2015   Pneumococcal Polysaccharide-23 04/03/2010   Td 04/15/2020   Td (Adult), 2 Lf Tetanus Toxid, Preservative Free 04/15/2020   Tdap 11/27/2008   Zoster Recombinat (Shingrix) 06/19/2021, 09/15/2021   Zoster, Live 02/05/2010    TDAP status: Up to date  Flu Vaccine status: Up to date  Pneumococcal vaccine status: Up to date  Covid-19 vaccine status: Completed vaccines  Qualifies for Shingles Vaccine? Yes   Zostavax completed Yes   Shingrix Completed?: No.    Education has been provided regarding the importance of this vaccine. Patient has been advised to call insurance company to determine out of pocket expense if they have not yet received this vaccine. Advised may also receive vaccine at local pharmacy or Health Dept. Verbalized acceptance and understanding.  Screening Tests Health Maintenance  Topic Date Due   COVID-19 Vaccine (5 - 2023-24 season) 02/13/2022   COLONOSCOPY (Pts 45-85yrs Insurance coverage will need to be confirmed)  11/25/2022 (Originally 10/29/2017)   OPHTHALMOLOGY EXAM  04/21/2023 (Originally 10/29/1955)   Diabetic kidney evaluation - Urine ACR  12/05/2022   FOOT EXAM  12/05/2022   INFLUENZA VACCINE  01/14/2023   HEMOGLOBIN A1C  02/19/2023   Diabetic kidney evaluation - eGFR measurement  10/01/2023   Medicare Annual Wellness (AWV)  10/27/2023   DTaP/Tdap/Td (4 - Td or Tdap) 04/15/2030   Pneumonia Vaccine 87+ Years old  Completed   Hepatitis C Screening  Completed   Zoster Vaccines- Shingrix  Completed   HPV VACCINES  Aged Out    Health Maintenance  Health  Maintenance Due  Topic Date Due   COVID-19 Vaccine (5 - 2023-24 season) 02/13/2022    Colorectal cancer screening: No longer required.   Lung Cancer Screening: (Low Dose CT Chest recommended if Age 8-80 years, 30 pack-year currently smoking OR have quit w/in 15years.) does not qualify.   Lung Cancer Screening Referral: No  Additional Screening:  Hepatitis C Screening: does qualify; Completed 09/15/2017  Vision Screening: Recommended annual ophthalmology exams for early  detection of glaucoma and other disorders of the eye. Is the patient up to date with their annual eye exam?  Yes  Who is the provider or what is the name of the office in which the patient attends annual eye exams? [facility]  If pt is not established with a provider, would they like to be referred to a provider to establish care? No .   Dental Screening: Recommended annual dental exams for proper oral hygiene  Community Resource Referral / Chronic Care Management: CRR required this visit?  No   CCM required this visit?  No      Plan:     I have personally reviewed and noted the following in the patient's chart:   Medical and social history Use of alcohol, tobacco or illicit drugs  Current medications and supplements including opioid prescriptions. Patient is not currently taking opioid prescriptions. Functional ability and status Nutritional status Physical activity Advanced directives List of other physicians Hospitalizations, surgeries, and ER visits in previous 12 months Vitals Screenings to include cognitive, depression, and falls Referrals and appointments  In addition, I have reviewed and discussed with patient certain preventive protocols, quality metrics, and best practice recommendations. A written personalized care plan for preventive services as well as general preventive health recommendations were provided to patient.     Mickeal Needy, LPN   1/61/0960   Nurse Notes: Normal  cognitive status assessed by direct observation via telephone conversation by this Nurse Health Advisor. No abnormalities found.

## 2022-10-27 NOTE — Telephone Encounter (Signed)
Spoke with patient; AWV was completed.  Evelean Bigler N. Mindy Behnken, LPN. [facility] AWV Team Direct Dial: [phone]

## 2022-10-27 NOTE — Telephone Encounter (Signed)
I don't see any question is there one?

## 2022-10-30 ENCOUNTER — Ambulatory Visit: Payer: Self-pay | Admitting: Licensed Clinical Social Worker

## 2022-10-30 NOTE — Patient Outreach (Signed)
  Care Coordination  Follow Up Visit Note   10/30/2022 Name: Tanner [name] MRN: [id number] DOB: December 02, 1945  Tanner [name] is a 77 y.o. year old male who sees Myrlene Broker, MD for primary care. I spoke with  [name] by phone today.  What matters to the patients health and wellness today?  Managing stress related to decline in health  Patient reports not being able to do the things he use to do, difficulty with button his shirt, noticed continued decline in his health. Concerned with his knee and not wanting to fall, he has decreased going out.   Goals Addressed             This Visit's Progress    decrease stress related to health decline       Activities and task to complete in order to accomplish goals.   Keep all upcoming appointment discussed today ( 11/06/22 consultation for knee surgery) Continue with compliance of taking medication prescribed by Doctor Self Support options  (use walker, reconnect with support group at church)        SDOH assessments and interventions completed:  Yes  SDOH Interventions Today    Flowsheet Row Most Recent Value  SDOH Interventions   Stress Interventions Provide Counseling       Care Coordination Interventions:  Yes, provided  Interventions Today    Flowsheet Row Most Recent Value  Chronic Disease   Chronic disease during today's visit Hypertension (HTN), Diabetes, Congestive Heart Failure (CHF), Chronic Kidney Disease/End Stage Renal Disease (ESRD)  General Interventions   Doctor Visits Discussed/Reviewed Doctor Visits Discussed  Jorje Guild listen with ref. upcoming appointment for knee surgery]  Mental Health Interventions   Mental Health Discussed/Reviewed Mental Health Reviewed, Coping Strategies, Grief and Loss  [emotional support,   active listening,   solution focused behavioral activation,]       Follow up plan: Follow up call scheduled for 2 weeks    Encounter Outcome:  Pt. Visit Completed   Sammuel Hines,  LCSW Social Work Care Coordination  [facility] [employer] [phone]

## 2022-10-30 NOTE — Patient Instructions (Signed)
Social Work Visit Information  Thank you for taking time to visit with me today. Please don't hesitate to contact me if I can be of assistance to you.   Following are the goals we discussed today:   Goals Addressed             This Visit's Progress    decrease stress related to health decline       Activities and task to complete in order to accomplish goals.   Keep all upcoming appointment discussed today ( 11/06/22 consultation for knee surgery) Continue with compliance of taking medication prescribed by Doctor Self Support options  (use walker, reconnect with support group at church)         Our next appointment is by telephone on 11/13/22 at 11:00   Please call the care guide team at [phone] if you need to cancel or reschedule your appointment.   If you or anyone you know are experiencing a Mental Health or Behavioral Health Crisis or need someone to talk to, please call the Suicide and Crisis Lifeline: 988 call the [place] National Suicide Prevention Lifeline: (725)475-0754 or TTY: (302) 082-8253 TTY 813-058-5225) to talk to a trained counselor call 1-800-273-TALK (toll free, 24 hour hotline) go to [facility] [address] [place] [phone])   Patient verbalizes understanding of instructions and care plan provided today and agrees to view in MyChart. Active MyChart status and patient understanding of how to access instructions and care plan via MyChart confirmed with patient.       Sammuel Hines, LCSW Social Work Care Coordination  [facility] [employer] [phone]

## 2022-11-06 ENCOUNTER — Ambulatory Visit: Payer: Commercial Managed Care - PPO | Attending: Urology | Admitting: Urology

## 2022-11-06 ENCOUNTER — Encounter (INDEPENDENT_AMBULATORY_CARE_PROVIDER_SITE_OTHER): Payer: Self-pay | Admitting: Urology

## 2022-11-06 ENCOUNTER — Other Ambulatory Visit: Payer: Self-pay

## 2022-11-06 ENCOUNTER — Other Ambulatory Visit (INDEPENDENT_AMBULATORY_CARE_PROVIDER_SITE_OTHER): Payer: Self-pay | Admitting: Urology

## 2022-11-06 VITALS — Resp 18 | Ht 70.98 in | Wt 237.0 lb

## 2022-11-06 DIAGNOSIS — N400 Enlarged prostate without lower urinary tract symptoms: Secondary | ICD-10-CM

## 2022-11-06 DIAGNOSIS — N3289 Other specified disorders of bladder: Secondary | ICD-10-CM

## 2022-11-06 DIAGNOSIS — N138 Other obstructive and reflux uropathy: Secondary | ICD-10-CM | POA: Insufficient documentation

## 2022-11-06 DIAGNOSIS — N401 Enlarged prostate with lower urinary tract symptoms: Secondary | ICD-10-CM

## 2022-11-06 NOTE — Procedures (Signed)
[facility]  [address]  [place] [address]  Operated by [facility]  Procedure Note    Name: Thomas [name] MRN:  [id number]   Date: 11/06/2022 DOB:  1945-08-16 (77 y.o.)         Cysto-Diagnostic    Performed by: Nolon Nations, MD  Authorized by: Nolon Nations, MD    Procedure discussed: discussed risks, benefits and alternatives    Chaperone present: yes    Timeout: timeout called immediately prior to procedure    Prep: patient was prepped and draped in usual sterile fashion    Prep type: Betadine    Anesthesia: local anesthesia      Procedure Details     Cystoscope type: flexible    Cystoscopy route: transurethral      Cystoscopy location: native bladder      Irrigation used: saline      Position: supine    Urethra     Urethra: normal      Prostate     Prostate: abnormal      Appearance: obstructing      Lobe enlargement: lateral lobe enlargement      Bladder neck characteristics: open      Bladder     Bladder: abnormal      Abnormal bladder findings: trabeculation      Trabeculation comment: Mild to moderate    Right ureteral orifice appearance: orthotopic      Left ureteral orifice appearance: orthotopic      Right ureteral orifice efflux appearance: clear      Left ureteral orifice efflux appearance: clear      Post-Procedure Details     Catheter placed: no      Appearance of urine after procedure: clear    Outcome: patient tolerated procedure well with no complications      Disposition: discharged home in satisfactory condition            Nolon Nations, MD

## 2022-11-06 NOTE — Progress Notes (Signed)
[facility] MEDICINE  [place] [facility] - FOLLOWUP    PATIENT NAME :Thomas [name]   DATE OF SERVICE: 11/06/2022  SERVICE LOCATION: [facility]      Thomas [name] is a 77 y.o. male who presents for followup of weakened stream and hesitancy with hematospermia.    He has been on oral medications for some time, and has had minimal improvement with additional Proscar.  He has not had any gross hematuria, but did have intermittent hematospermia.  He denies any flank pain, nausea vomiting.      From review of prior imaging, he is undergone CT scan of the abdomen pelvis in 2020, showing a 6.6 x 5.3 x 4.2cm = 76.7g prostate.    AUASS  11/06/2022 - 14 / 5 on max medical therapy.      Past Medical History  Past Medical History:   Diagnosis Date    Arthritis     Atrial flutter (CMS HCC)     Cancer (CMS HCC)     Dysuria     Enteritis due to Rotavirus     Gout     Headache     Hemorrhoid     Hypercholesterolemia     Hypertension            Past Surgical History  Past Surgical History:   Procedure Laterality Date    HX HEMORRHOIDECTOMY             Allergies  No Known Allergies      Medications  acetaminophen (TYLENOL) 500 mg Oral Tablet, Take 1 Tablet (500 mg total) by mouth Every 4 hours as needed for Pain (pt takes tylenol pm)  alfuzosin (UROXATRAL) 10 mg Oral Tablet Sustained Release 24 hr, TAKE ONE TABLET BY MOUTH EVERY DAY  apixaban (ELIQUIS) 5 mg Oral Tablet, Take 1 Tablet (5 mg total) by mouth Twice daily SHIP TO PATIENT,   [id number]  STATE LICENSE- [id number]  NPI- [id number]  diltiazem HCl (TIAZAC) 240 mg Oral Capsule,Sustained Action 24 hr, TAKE ONE CAPSULE BY MOUTH DAILY  finasteride (PROSCAR) 5 mg Oral Tablet, TAKE ONE TABLET BY MOUTH EVERY DAY  flecainide (TAMBOCOR) 100 mg Oral Tablet, TAKE ONE TABLET BY MOUTH TWICE DAILY  furosemide (LASIX) 40 mg Oral Tablet, TAKE ONE TABLET BY MOUTH EVERY DAY  losartan (COZAAR) 50 mg Oral Tablet, Take 1 Tablet (50 mg total) by mouth Once a  day  nitroGLYCERIN (NITROSTAT) 0.4 mg Sublingual Tablet, Sublingual, Place 1 Tablet (0.4 mg total) under the tongue Every 5 minutes as needed for Chest pain  potassium chloride (KLOR-CON) 10 mEq Oral Tablet Sustained Release, TAKE ONE TABLET BY MOUTH EVERY DAY  rosuvastatin (CRESTOR) 10 mg Oral Tablet, Take 1 Tablet (10 mg total) by mouth Every evening  SPIRIVA WITH HANDIHALER 18 mcg Inhalation Capsule, w/Inhalation Device, Take 1 Capsule (18 mcg total) by inhalation Once a day    No facility-administered medications prior to visit.      OBJECTIVE:  Resp 18   Ht 1.803 m (5' 10.98")   Wt 108 kg (237 lb)   BMI 33.07 kg/m       General: No apparent distress, well appearing  HEENT:  NCAT, EOMI, OP clear  Neck:  supple, trachea midline  RESP:  Nonlabored breathing, no use of accessory muscles   CV: No extremity swelling  Neuro:  Neurological exam is consistent with patients age.     GU:  Phallus within normal limits, orthotopic meatus.  Scrotal skin and contents  within normal limits  DRE: completed last office visit  Psych: alert, appropriate mood, and in no acute distress.      MSK: Ambulates without assistance, bilateral compression stockings in place      PSA:  05/2016 - 1.30  06/2017 - 2.60  04/2021 - 2.22  04/2022 - 1.66    I personally reviewed the following:    Labs - none  Imaging - none  Records - pt previous office records      ASSESSMENT:   Encounter Diagnoses   Name Primary?    Benign prostatic hyperplasia with urinary obstruction Yes             PLAN: 77 year old male with history of BPH w/ LUTS and hematospermia  - we discussed his AUA symptom score, and considerable hypertrophy of the prostate seen on cystoscopy today.    We discussed different surgical options.  His last prostate sizing via CT was a few years ago.  We discussed proceeding with transrectal ultrasound of the prostate for appropriate sizing.  We discussed minimally invasive therapies versus trans urethral resection.  He will think over  these options after reviewing literature provided.  - will see him back after review of his trust to better counts regarding treatment options  - All questions and concerns were addressed during this visit.   - More that 20 minutes was spent in counseling regarding trans urethral resection of the prostate, UroLift common resume, as well as other minimally invasive therapies, relevant anatomy regarding bladder outlet surgeries.  This time was spent separate from cystoscopic maneuvers      Orders Placed This Encounter    Cysto-Diagnostic     Nolon Nations, MD   [facility]

## 2022-11-10 ENCOUNTER — Telehealth: Payer: Self-pay | Admitting: *Deleted

## 2022-11-10 NOTE — Telephone Encounter (Signed)
   Name: Tanner [name]  DOB: Jun 24, 1945  MRN: [id number]  Primary Cardiologist: Maisie Fus, MD  Chart reviewed as part of pre-operative protocol coverage. The patient has an upcoming visit scheduled with  Jari Favre, PA on 12/01/2022 at which time clearance can be addressed in case there are any issues that would impact surgical recommendations.  I will route this message as FYI to requesting party and remove this message from the preop box as separate preop APP input not needed at this time.   Please call with any questions.  Napoleon Form, Leodis Rains, NP  11/10/2022, 3:40 PM

## 2022-11-10 NOTE — Telephone Encounter (Signed)
   Pre-operative Risk Assessment    Patient Name: Tanner [name]  DOB: [birth year] MRN: [id number]      Request for Surgical Clearance    Procedure:   Right Total Knee Arthroplasty.  Date of Surgery:  Clearance 12/15/22                                 Surgeon:  Dr. Durene Romans Surgeon's Group or Practice Name:  [facility] Phone number:  [phone] Fax number:  [phone]   Type of Clearance Requested:   - Medical  - Pharmacy:  Hold Apixaban (Eliquis) Not Indicated.   Type of Anesthesia:  Spinal   Additional requests/questions:  Jari Favre, PA is seeing pt on 12/01/2022. Pre op added to notes.   Signed, Emmit Pomfret   11/10/2022, 3:29 PM

## 2022-11-11 ENCOUNTER — Telehealth: Payer: Self-pay | Admitting: Internal Medicine

## 2022-11-11 NOTE — Telephone Encounter (Signed)
Patient dropped off document Surgical Clearance, to be filled out by provider. Patient requested to send it via Fax within 7-days. Document is located in providers tray at front office.Please advise at Mobile [phone] (mobile)  Please fax to: [phone]

## 2022-11-11 NOTE — Telephone Encounter (Signed)
Placed inside Dr Crawfords office box 

## 2022-11-13 ENCOUNTER — Encounter (HOSPITAL_COMMUNITY): Payer: Self-pay

## 2022-11-13 ENCOUNTER — Ambulatory Visit: Payer: Self-pay | Admitting: Licensed Clinical Social Worker

## 2022-11-13 NOTE — Patient Instructions (Signed)
Social Work Visit Information  Thank you for taking time to visit with me today. Please don't hesitate to contact me if I can be of assistance to you.   Following are the goals we discussed today:   Goals Addressed             This Visit's Progress    decrease stress related to health decline       Activities and task to complete in order to accomplish goals.   Keep all upcoming appointment discussed today ( all consultations leading up to knee surgery) Continue with compliance of taking medication prescribed by Doctor Self Support options  (continue using walker, reconnect with support group at church, continue to receive support from your son)        Our next appointment is by telephone on 12/31/22 at 11:00   Please call the care guide team at [phone] if you need to cancel or reschedule your appointment.   If you or anyone you know are experiencing a Mental Health or Behavioral Health Crisis or need someone to talk to, please call the Suicide and Crisis Lifeline: 988 call the [place] National Suicide Prevention Lifeline: 971 488 5737 or TTY: 929 730 4540 TTY 380-188-3814) to talk to a trained counselor call 1-800-273-TALK (toll free, 24 hour hotline) go to [facility] [address] [place] [phone])   Patient verbalizes understanding of instructions and care plan provided today and agrees to view in MyChart. Active MyChart status and patient understanding of how to access instructions and care plan via MyChart confirmed with patient.      Sammuel Hines, LCSW Social Work Care Coordination  [facility] [employer] [phone]

## 2022-11-13 NOTE — Patient Outreach (Signed)
  Care Coordination  Follow Up Visit Note   11/13/2022 Name: Tanner [name] MRN: [id number] DOB: [birth year]  Tanner [name] is a 77 y.o. year old male who sees Myrlene Broker, MD for primary care. I spoke with  [name] by phone today.  What matters to the patients health and wellness today?  Reducing knee pain   Patient reports doing well today with mental and physical health.  He is excited about upcoming knee surgery which has reduced his level of stress.  Report he continues to use his walker and has not had any recent falls.   Goals Addressed             This Visit's Progress    decrease stress related to health decline       Activities and task to complete in order to accomplish goals.   Keep all upcoming appointment discussed today ( all consultations leading up to knee surgery) Continue with compliance of taking medication prescribed by Doctor Self Support options  (continue using walker, reconnect with support group at church, continue to receive support from your son)        SDOH assessments and interventions completed:  No   Care Coordination Interventions:  Yes, provided  Interventions Today    Flowsheet Row Most Recent Value  Chronic Disease   Chronic disease during today's visit Hypertension (HTN), Congestive Heart Failure (CHF), Diabetes, Chronic Kidney Disease/End Stage Renal Disease (ESRD)  General Interventions   General Interventions Discussed/Reviewed General Interventions Reviewed  Doctor Visits Discussed/Reviewed Doctor Visits Reviewed  [upcoming surgery]  Education Interventions   Education Provided Provided Education  Provided Verbal Education On Mental Health/Coping with Illness  [active listening, emotional support]  Advanced Directive Interventions   Advanced Directives Discussed/Reviewed Advanced Directives Reviewed  Mosetta Pigeon documents on file under media tab]       Follow up plan: Follow up call scheduled for 12/31/22    Encounter  Outcome:  Pt. Visit Completed   Sammuel Hines, LCSW Social Work Care Coordination  [facility] [employer] [phone]

## 2022-11-16 ENCOUNTER — Telehealth: Payer: Self-pay | Admitting: Internal Medicine

## 2022-11-16 NOTE — Telephone Encounter (Signed)
Sonya from [facility] called to check on the status of their fax. They faxed a face to face clarification over on 11/13/2022. Best callback is [phone]).

## 2022-11-18 ENCOUNTER — Telehealth: Payer: Self-pay | Admitting: Internal Medicine

## 2022-11-18 NOTE — Telephone Encounter (Signed)
Gerri Spore Lippart with [facility] Phone:  [phone]  [facility] called to report that Mr. Seats had a fall today.  6/65/2024 - Patient states that he had been on the floor since 7:00 am.  Patient had 2 little skin tears on right forearm.  Vital signs all within normal limits.  BP  134/58, oxygen 92-93% - Heart Rate at 93.

## 2022-11-18 NOTE — Telephone Encounter (Signed)
Pt has already left for today will forward to her desktop to review when she return tomorrow.Marland KitchenShearon Stalls

## 2022-11-19 ENCOUNTER — Ambulatory Visit: Payer: Commercial Managed Care - PPO | Attending: Urology

## 2022-11-19 ENCOUNTER — Other Ambulatory Visit: Payer: Self-pay

## 2022-11-19 ENCOUNTER — Other Ambulatory Visit (HOSPITAL_COMMUNITY): Payer: Commercial Managed Care - PPO

## 2022-11-19 DIAGNOSIS — I10 Essential (primary) hypertension: Secondary | ICD-10-CM

## 2022-11-19 DIAGNOSIS — N400 Enlarged prostate without lower urinary tract symptoms: Secondary | ICD-10-CM

## 2022-11-19 LAB — BASIC METABOLIC PANEL, FASTING
ANION GAP: 7 mmol/L (ref 4–13)
BUN/CREA RATIO: 21 (ref 6–22)
BUN: 18 mg/dL (ref 8–25)
CALCIUM: 8.6 mg/dL (ref 8.6–10.3)
CHLORIDE: 105 mmol/L (ref 96–111)
CO2 TOTAL: 30 mmol/L (ref 23–31)
CREATININE: 0.86 mg/dL (ref 0.75–1.35)
ESTIMATED GFR - MALE: >90 mL/min/BSA (ref >=60)
GLUCOSE: 84 mg/dL (ref 70–99)
POTASSIUM: 3.4 mmol/L — ABNORMAL LOW (ref 3.5–5.1)
SODIUM: 142 mmol/L (ref 136–145)

## 2022-11-19 LAB — CBC
HCT: 43.6 % (ref 38.9–52.0)
HGB: 13.8 g/dL (ref 13.4–17.5)
MCH: 30.7 pg (ref 26.0–32.0)
MCHC: 31.7 g/dL (ref 31.0–35.5)
MCV: 96.9 fL (ref 78.0–100.0)
MPV: 10.1 fL (ref 8.7–12.5)
PLATELETS: 173 10*3/uL (ref 150–400)
RBC: 4.5 10*6/uL (ref 4.50–6.10)
RDW-CV: 13.2 % (ref 11.5–15.5)
WBC: 6.7 10*3/uL (ref 3.7–11.0)

## 2022-11-19 LAB — LIPID PANEL
CHOL/HDL RATIO: 1.8
CHOLESTEROL: 130 mg/dL (ref 100–200)
HDL CHOL: 71 mg/dL (ref >=50)
LDL CALC: 48 mg/dL (ref <100)
NON-HDL: 59 mg/dL (ref <=190)
TRIGLYCERIDES: 45 mg/dL (ref <150)
VLDL CALC: 6 mg/dL (ref <30)

## 2022-11-19 LAB — ALT (SGPT): ALT (SGPT): 18 U/L (ref 10–55)

## 2022-11-19 LAB — AST (SGOT): AST (SGOT): 16 U/L (ref 8–45)

## 2022-11-19 NOTE — Telephone Encounter (Signed)
Ok to monitor.

## 2022-11-19 NOTE — Result Encounter Note (Signed)
I will discuss with patient at upcoming visit.

## 2022-11-20 NOTE — Telephone Encounter (Signed)
Spoke with Boyd and stated to him to monitor per providers recommendations

## 2022-11-24 NOTE — Telephone Encounter (Signed)
Have not received this.

## 2022-11-30 NOTE — Progress Notes (Addendum)
COVID Vaccine Completed:  Yes  Date of COVID positive in last 90 days:  PCP - Hillard Danker, MD Cardiologist - Carolan Clines, MD.  Appt scheduled for 12-01-22  Cardiac clearance in Epic dated 08-31-22 by Dr. Wyline Mood  Medical clearance on chart dated 11-12-22  Chest x-ray - 08-18-22 Epic EKG - 08-20-22 Epic Stress Test -  ECHO - 08-19-22 Epic Cardiac Cath -  Pacemaker/ICD device last checked: Spinal Cord Stimulator: Long Term Monitor - 01-01-22 Epic  Bowel Prep -   Sleep Study - Yes, +sleep apnea CPAP -   Fasting Blood Sugar -  Checks Blood Sugar _____ times a day  Semaglutide Last dose of GLP1 agonist-  12-07-22 GLP1 instructions:  Hold x7 days   Jardiance Last dose of SGLT-2 inhibitors-  12-11-22 SGLT-2 instructions: Hold x3 days   Blood Thinner Instructions:  Eliquis Time Aspirin Instructions: Last Dose:  Activity level:  Can go up a flight of stairs and perform activities of daily living without stopping and without symptoms of chest pain or shortness of breath.  Able to exercise without symptoms  Unable to go up a flight of stairs without symptoms of     Anesthesia review:  Afib, heart failure, mod mitral regurg, RBBB, HTN, DM, CKD  Patient denies shortness of breath, fever, cough and chest pain at PAT appointment  Patient verbalized understanding of instructions that were given to them at the PAT appointment. Patient was also instructed that they will need to review over the PAT instructions again at home before surgery.

## 2022-11-30 NOTE — Progress Notes (Unsigned)
Cardiology Office Note:  .   Date:  12/01/2022  ID:  ZAYLYNN KAMERMAN, DOB 17-Jan-1946, MRN [id number] PCP: Myrlene Broker, MD  [facility] Providers Cardiologist:  Maisie Fus, MD { History of Present Illness: .   Tanner [name] is a 77 y.o. male with a past medical history of HFpEF, type 2 diabetes mellitus, CKD stage III, DVT, and PE (on previous Eliquis), HTN who is being seen for preop evaluation.  Patient with a history of shortness of breath and was brought to the hospital via EMS.  Underwent DCCV on 3/6 for atrial fibrillation and RVR.  Sinus rhythm was restored.  Started on IV Amio which was transitioned to oral amnio.  Changed his home atenolol to Coreg.  Was maintained on Eliquis.  TSH normal.  Had some decompensation and had IV Lasix.  Was transition to home torsemide.  Hospital admission 03/26/2022 and saw Dr. Anne Fu.  Had some leg edema.  Was noted to have moderate MR.  Managed with diuretics.  He was last seen by Dr. Carolan Clines March 2024 and at that time was doing well.  BP well-controlled.  He felt fair since leaving the hospital.  He noted that weight had been stable around 232.  Planning for knee replacement.  No other CV symptoms.  Today, he tells me that he has not had any chest pain or shortness of breath.  He has not passed out again.  He is trying to get his knee replacement has been working with [facility] to get this done.  [facility].  He presents today for clearance.  Luckily, he has been doing well from a cardiac standpoint.  Blood pressure is well-controlled today.  He has been compliant with all his medications.  No further issues with atrial fibrillation, palpitations.  Reports no shortness of breath nor dyspnea on exertion. Reports no chest pain, pressure, or tightness. No edema, orthopnea, PND. Reports no palpitations.    ROS: Please review HPI for pertinent ROS.  Studies Reviewed: Marland Kitchen    EKG:  reviewed  and in the chart   Risk Assessment/Calculations:    CHA2DS2-VASc Score = 8  This indicates a 10.8% annual risk of stroke. [name] score is based upon: CHF History: 1 HTN History: 1 Diabetes History: 1 Stroke History: 2 (Recurrent idiopathic pulmonary emboli/left leg thrombus) Vascular Disease History: 1 Age Score: 2 Gender Score: 0           Physical Exam:   VS:  BP 130/72   Ht 5' 4.5" (1.638 m)   Wt 236 lb 12.8 oz (107.4 kg)   SpO2 92%   BMI 40.02 kg/m    Wt Readings from Last 3 Encounters:  12/01/22 236 lb 12.8 oz (107.4 kg)  10/27/22 240 lb (108.9 kg)  10/19/22 240 lb (108.9 kg)    GEN: Well nourished, well developed in no acute distress NECK: No JVD; No carotid bruits CARDIAC: RRR, no murmurs, rubs, gallops RESPIRATORY:  Clear to auscultation without rales, wheezing or rhonchi  ABDOMEN: Soft, non-tender, non-distended EXTREMITIES:  No edema; No deformity   ASSESSMENT AND PLAN: .   1.  Preop evaluation -His DASI exceeds 4 METS which is the minimum requirement.  According to RCRI he is at low risk for any cardiac complication.  We have advised him to hold his Eliquis 3 days prior to his surgery and resume medically safe to do so.  We  also have suggested that he get clearance from Dr. Myna Hidalgo.  2.  HFpEF -Small amount of lower extremity edema today -He is not taking his Demadex -Continue lower extremity compression and diuretics -Elevate legs when able -Continue low-sodium, heart healthy diet  3.  PAF -No further issues with atrial fibrillation -Continue current medication regimen which includes amiodarone 200 mg daily, Eliquis 5 mg twice a day, carvedilol 6.25 mg twice a day -asymptomatic today  4.  HLD -Most recent LDL 41 (12/04/2021) -He will need an updated lipid panel when he is next in the office -For now, continue Lipitor 80 mg daily  5.  HTN -Blood pressure well-controlled today, 130/72 -Continue to check blood pressure at home -Continue  current medications      Dispo: He can follow-up in 6 months with Dr. Carolan Clines  Signed, Sharlene Dory, PA-C

## 2022-11-30 NOTE — Patient Instructions (Signed)
SURGICAL WAITING ROOM VISITATION Patients having surgery or a procedure may have no more than 2 support people in the waiting area - these visitors may rotate.    Children under the age of 73 must have an adult with them who is not the patient.  If the patient needs to stay at the hospital during part of their recovery, the visitor guidelines for inpatient rooms apply. Pre-op nurse will coordinate an appropriate time for 1 support person to accompany patient in pre-op.  This support person may not rotate.    Please refer to the [facility] website for the visitor guidelines for Inpatients (after your surgery is over and you are in a regular room).    Your procedure is scheduled on: 12-15-22   Report to [facility] Main Entrance    Report to admitting at 11:50 AM   Call this number if you have problems the morning of surgery [phone]   Do not eat food :After Midnight.   After Midnight you may have the following liquids until 11:20 AM DAY OF SURGERY  Water Non-Citrus Juices (without pulp, NO RED-Apple, White grape, White cranberry) Black Coffee (NO MILK/CREAM OR CREAMERS, sugar ok)  Clear Tea (NO MILK/CREAM OR CREAMERS, sugar ok) regular and decaf                             Plain Jell-O (NO RED)                                           Fruit ices (not with fruit pulp, NO RED)                                     Popsicles (NO RED)                                                               Sports drinks like Gatorade (NO RED)     The day of surgery:  Drink ONE (1) Pre-Surgery G2 at 11:20 AM the morning of surgery. Drink in one sitting. Do not sip.  This drink was given to you during your hospital  pre-op appointment visit. Nothing else to drink after completing the Pre-Surgery G2.          If you have questions, please contact your surgeon's office.   FOLLOW  ANY ADDITIONAL PRE OP INSTRUCTIONS YOU RECEIVED FROM YOUR SURGEON'S OFFICE!!!     Oral Hygiene is also  important to reduce your risk of infection.                                    Remember - BRUSH YOUR TEETH THE MORNING OF SURGERY WITH YOUR REGULAR TOOTHPASTE   Take these medicines the morning of surgery with A SIP OF WATER:   Amiodarone  Atorvastatin  Bupropion  Carvedilol  Gabapentin  Tylenol if needed    How to Manage Your Diabetes Before and After Surgery  Why is it important to  control my blood sugar before and after surgery? Improving blood sugar levels before and after surgery helps healing and can limit problems. A way of improving blood sugar control is eating a healthy diet by:  Eating less sugar and carbohydrates  Increasing activity/exercise  Talking with your doctor about reaching your blood sugar goals High blood sugars (greater than 180 mg/dL) can raise your risk of infections and slow your recovery, so you will need to focus on controlling your diabetes during the weeks before surgery. Make sure that the doctor who takes care of your diabetes knows about your planned surgery including the date and location.  How do I manage my blood sugar before surgery? Check your blood sugar at least 4 times a day, starting 2 days before surgery, to make sure that the level is not too high or low. Check your blood sugar the morning of your surgery when you wake up and every 2 hours until you get to the Short Stay unit. If your blood sugar is less than 70 mg/dL, you will need to treat for low blood sugar: Do not take insulin. Treat a low blood sugar (less than 70 mg/dL) with  cup of clear juice (cranberry or apple), 4 glucose tablets, OR glucose gel. Recheck blood sugar in 15 minutes after treatment (to make sure it is greater than 70 mg/dL). If your blood sugar is not greater than 70 mg/dL on recheck, call [phone] for further instructions. Report your blood sugar to the short stay nurse when you get to Short Stay.  If you are admitted to the hospital after surgery: Your  blood sugar will be checked by the staff and you will probably be given insulin after surgery (instead of oral diabetes medicines) to make sure you have good blood sugar levels. The goal for blood sugar control after surgery is 80-180 mg/dL.   WHAT DO I DO ABOUT MY DIABETES MEDICATION?  Do not take oral diabetes medicines (pills) the morning of surgery.  Semaglutide (Ozempic) Last dose:  do not take after 12-12-22  (Hold x7 days before surgery)   Jardiance Last dose: 12-11-22 (Hold x3 days before surgery)  DO NOT TAKE THE FOLLOWING 7 DAYS PRIOR TO SURGERY: Ozempic, Wegovy, Rybelsus (Semaglutide), Byetta (exenatide), Bydureon (exenatide ER), Victoza, Saxenda (liraglutide), or Trulicity (dulaglutide) Mounjaro (Tirzepatide) Adlyxin (Lixisenatide), Polyethylene Glycol Loxenatide. Reviewed and Endorsed by [facility] Patient Education Committee, August 2015                              You may not have any metal on your body including  jewelry, and body piercing             Do not wear  lotions, powders, cologne, or deodorant              Men may shave face and neck.   Do not bring valuables to the hospital. [facility] IS NOT RESPONSIBLE   FOR VALUABLES.   Contacts, dentures or bridgework may not be worn into surgery.   Bring small overnight bag day of surgery.   DO NOT BRING YOUR HOME MEDICATIONS TO THE HOSPITAL. PHARMACY WILL DISPENSE MEDICATIONS LISTED ON YOUR MEDICATION LIST TO YOU DURING YOUR ADMISSION IN THE HOSPITAL!              Please read over the following fact sheets you were given: IF YOU HAVE QUESTIONS ABOUT YOUR PRE-OP INSTRUCTIONS PLEASE CALL [phone]- [facility] [place]  If you received a COVID test during your pre-op visit  it is requested that you wear a mask when out in public, stay away from anyone that may not be feeling well and notify your surgeon if you develop symptoms. If you test positive for Covid or have been in contact with anyone that has tested positive in the  last 10 days please notify you surgeon.   Pre-operative 5 CHG Bath Instructions   You can play a key role in reducing the risk of infection after surgery. Your skin needs to be as free of germs as possible. You can reduce the number of germs on your skin by washing with CHG (chlorhexidine gluconate) soap before surgery. CHG is an antiseptic soap that kills germs and continues to kill germs even after washing.   DO NOT use if you have an allergy to chlorhexidine/CHG or antibacterial soaps. If your skin becomes reddened or irritated, stop using the CHG and notify one of our RNs at  [phone] .   Please shower with the CHG soap starting 4 days before surgery using the following schedule:     Please keep in mind the following:  DO NOT shave, including legs and underarms, starting the day of your first shower.   You may shave your face at any point before/day of surgery.  Place clean sheets on your bed the day you start using CHG soap. Use a clean washcloth (not used since being washed) for each shower. DO NOT sleep with pets once you start using the CHG.   CHG Shower Instructions:  If you choose to wash your hair and private area, wash first with your normal shampoo/soap.  After you use shampoo/soap, rinse your hair and body thoroughly to remove shampoo/soap residue.  Turn the water OFF and apply about 3 tablespoons (45 ml) of CHG soap to a CLEAN washcloth.  Apply CHG soap ONLY FROM YOUR NECK DOWN TO YOUR TOES (washing for 3-5 minutes)  DO NOT use CHG soap on face, private areas, open wounds, or sores.  Pay special attention to the area where your surgery is being performed.  If you are having back surgery, having someone wash your back for you may be helpful. Wait 2 minutes after CHG soap is applied, then you may rinse off the CHG soap.  Pat dry with a clean towel  Put on clean clothes/pajamas   If you choose to wear lotion, please use ONLY the CHG-compatible lotions on the back of  this paper.     Additional instructions for the day of surgery: DO NOT APPLY any lotions, deodorants, cologne, or perfumes.   Put on clean/comfortable clothes.  Brush your teeth.  Ask your nurse before applying any prescription medications to the skin.      CHG Compatible Lotions   Aveeno Moisturizing lotion  Cetaphil Moisturizing Cream  Cetaphil Moisturizing Lotion  Clairol Herbal Essence Moisturizing Lotion, Dry Skin  Clairol Herbal Essence Moisturizing Lotion, Extra Dry Skin  Clairol Herbal Essence Moisturizing Lotion, Normal Skin  Curel Age Defying Therapeutic Moisturizing Lotion with Alpha Hydroxy  Curel Extreme Care Body Lotion  Curel Soothing Hands Moisturizing Hand Lotion  Curel Therapeutic Moisturizing Cream, Fragrance-Free  Curel Therapeutic Moisturizing Lotion, Fragrance-Free  Curel Therapeutic Moisturizing Lotion, Original Formula  Eucerin Daily Replenishing Lotion  Eucerin Dry Skin Therapy Plus Alpha Hydroxy Crme  Eucerin Dry Skin Therapy Plus Alpha Hydroxy Lotion  Eucerin Original Crme  Eucerin Original Lotion  Eucerin Plus Crme Eucerin Plus Lotion  Eucerin  TriLipid Replenishing Lotion  Keri Anti-Bacterial Hand Lotion  Keri Deep Conditioning Original Lotion Dry Skin Formula Softly Scented  Keri Deep Conditioning Original Lotion, Fragrance Free Sensitive Skin Formula  Keri Lotion Fast Absorbing Fragrance Free Sensitive Skin Formula  Keri Lotion Fast Absorbing Softly Scented Dry Skin Formula  Keri Original Lotion  Keri Skin Renewal Lotion Keri Silky Smooth Lotion  Keri Silky Smooth Sensitive Skin Lotion  Nivea Body Creamy Conditioning Oil  Nivea Body Extra Enriched Lotion  Nivea Body Original Lotion  Nivea Body Sheer Moisturizing Lotion Nivea Crme  Nivea Skin Firming Lotion  NutraDerm 30 Skin Lotion  NutraDerm Skin Lotion  NutraDerm Therapeutic Skin Cream  NutraDerm Therapeutic Skin Lotion  ProShield Protective Hand Cream  Provon moisturizing lotion    PATIENT SIGNATURE_________________________________  NURSE SIGNATURE__________________________________  ________________________________________________________________________    Tanner [name]  An incentive spirometer is a tool that can help keep your lungs clear and active. This tool measures how well you are filling your lungs with each breath. Taking long deep breaths may help reverse or decrease the chance of developing breathing (pulmonary) problems (especially infection) following: A long period of time when you are unable to move or be active. BEFORE THE PROCEDURE  If the spirometer includes an indicator to show your best effort, your nurse or respiratory therapist will set it to a desired goal. If possible, sit up straight or lean slightly forward. Try not to slouch. Hold the incentive spirometer in an upright position. INSTRUCTIONS FOR USE  Sit on the edge of your bed if possible, or sit up as far as you can in bed or on a chair. Hold the incentive spirometer in an upright position. Breathe out normally. Place the mouthpiece in your mouth and seal your lips tightly around it. Breathe in slowly and as deeply as possible, raising the piston or the ball toward the top of the column. Hold your breath for 3-5 seconds or for as long as possible. Allow the piston or ball to fall to the bottom of the column. Remove the mouthpiece from your mouth and breathe out normally. Rest for a few seconds and repeat Steps 1 through 7 at least 10 times every 1-2 hours when you are awake. Take your time and take a few normal breaths between deep breaths. The spirometer may include an indicator to show your best effort. Use the indicator as a goal to work toward during each repetition. After each set of 10 deep breaths, practice coughing to be sure your lungs are clear. If you have an incision (the cut made at the time of surgery), support your incision when coughing by placing a pillow or  rolled up towels firmly against it. Once you are able to get out of bed, walk around indoors and cough well. You may stop using the incentive spirometer when instructed by your caregiver.  RISKS AND COMPLICATIONS Take your time so you do not get dizzy or light-headed. If you are in pain, you may need to take or ask for pain medication before doing incentive spirometry. It is harder to take a deep breath if you are having pain. AFTER USE Rest and breathe slowly and easily. It can be helpful to keep track of a log of your progress. Your caregiver can provide you with a simple table to help with this. If you are using the spirometer at home, follow these instructions: SEEK MEDICAL CARE IF:  You are having difficultly using the spirometer. You have trouble using the spirometer as  often as instructed. Your pain medication is not giving enough relief while using the spirometer. You develop fever of 100.5 F (38.1 C) or higher. SEEK IMMEDIATE MEDICAL CARE IF:  You cough up bloody sputum that had not been present before. You develop fever of 102 F (38.9 C) or greater. You develop worsening pain at or near the incision site. MAKE SURE YOU:  Understand these instructions. Will watch your condition. Will get help right away if you are not doing well or get worse. Document Released: 10/12/2006 Document Revised: 08/24/2011 Document Reviewed: 12/13/2006 [facility] Patient Information 2014 [place], [place].   ________________________________________________________________________

## 2022-12-01 ENCOUNTER — Other Ambulatory Visit (INDEPENDENT_AMBULATORY_CARE_PROVIDER_SITE_OTHER): Payer: Self-pay | Admitting: Family Medicine

## 2022-12-01 ENCOUNTER — Ambulatory Visit: Payer: Medicare HMO | Attending: Physician Assistant | Admitting: Physician Assistant

## 2022-12-01 ENCOUNTER — Encounter: Payer: Self-pay | Admitting: Physician Assistant

## 2022-12-01 VITALS — BP 130/72 | Ht 64.5 in | Wt 236.8 lb

## 2022-12-01 DIAGNOSIS — I5030 Unspecified diastolic (congestive) heart failure: Secondary | ICD-10-CM | POA: Diagnosis not present

## 2022-12-01 DIAGNOSIS — E785 Hyperlipidemia, unspecified: Secondary | ICD-10-CM | POA: Diagnosis not present

## 2022-12-01 DIAGNOSIS — I340 Nonrheumatic mitral (valve) insufficiency: Secondary | ICD-10-CM | POA: Diagnosis not present

## 2022-12-01 DIAGNOSIS — R0600 Dyspnea, unspecified: Secondary | ICD-10-CM

## 2022-12-01 DIAGNOSIS — I10 Essential (primary) hypertension: Secondary | ICD-10-CM | POA: Diagnosis not present

## 2022-12-01 DIAGNOSIS — I480 Paroxysmal atrial fibrillation: Secondary | ICD-10-CM | POA: Diagnosis not present

## 2022-12-01 NOTE — Telephone Encounter (Signed)
Patient with diagnosis of afib on Eliquis for anticoagulation.    Procedure: right TKA Date of procedure: 12/15/22  CHA2DS2-VASc Score = 8  This indicates a 10.8% annual risk of stroke. [name] score is based upon: CHF History: 1 HTN History: 1 Diabetes History: 1 Stroke History: 2 (Recurrent idiopathic pulmonary emboli/left leg thrombus) Vascular Disease History: 1 Age Score: 2 Gender Score: 0   DCCV 08/19/22. On Eliquis prior to afib dx for prior VTE. Follows with Dr Myna Hidalgo for recurrent idiopathic pulmonary emboli/left leg thrombus, on lifelong Eliquis.  CrCl 52mL/min using adjusted body weight. Platelet count 232K  From an afib perspective, he is fine to hold Eliquis for 3 days, however he was taking Eliquis prior to afib diagnosis for recurrent idiopathic PE/DVT and is on lifelong anticoag per heme/onc. Would prefer they weigh in as well to ensure that pt is at acceptable risk to hold his Eliquis for 3 days prior to knee replacement.  **This guidance is not considered finalized until pre-operative APP has relayed final recommendations.**

## 2022-12-01 NOTE — Patient Instructions (Addendum)
Medication Instructions:  Your physician recommends that you continue on your current medications as directed. Please refer to the Current Medication list given to you today.  *If you need a refill on your cardiac medications before your next appointment, please call your pharmacy*  Lab Work: None ordered If you have labs (blood work) drawn today and your tests are completely normal, you will receive your results only by: MyChart Message (if you have MyChart) OR A paper copy in the mail If you have any lab test that is abnormal or we need to change your treatment, we will call you to review the results.  Follow-Up: At [facility], you and your health needs are our priority.  As part of our continuing mission to provide you with exceptional heart care, we have created designated Provider Care Teams.  These Care Teams include your primary Cardiologist (physician) and Advanced Practice Providers (APPs -  Physician Assistants and Nurse Practitioners) who all work together to provide you with the care you need, when you need it.  Your next appointment:   6 month(s)  Provider:   Maisie Fus, MD    Low-Sodium Eating Plan Salt (sodium) helps you keep a healthy balance of fluids in your body. Too much sodium can raise your blood pressure. It can also cause fluid and waste to be held in your body. Your health care provider or dietitian may recommend a low-sodium eating plan if you have high blood pressure (hypertension), kidney disease, liver disease, or heart failure. Eating less sodium can help lower your blood pressure and reduce swelling. It can also protect your heart, liver, and kidneys. What are tips for following this plan? Reading food labels  Check food labels for the amount of sodium per serving. If you eat more than one serving, you must multiply the listed amount by the number of servings. Choose foods with less than 140 milligrams (mg) of sodium per serving. Avoid foods  with 300 mg of sodium or more per serving. Always check how much sodium is in a product, even if the label says "unsalted" or "no salt added." Shopping  Buy products labeled as "low-sodium" or "no salt added." Buy fresh foods. Avoid canned foods and pre-made or frozen meals. Avoid canned, cured, or processed meats. Buy breads that have less than 80 mg of sodium per slice. Cooking  Eat more home-cooked food. Try to eat less restaurant, buffet, and fast food. Try not to add salt when you cook. Use salt-free seasonings or herbs instead of table salt or sea salt. Check with your provider or pharmacist before using salt substitutes. Cook with plant-based oils, such as canola, sunflower, or olive oil. Meal planning When eating at a restaurant, ask if your food can be made with less salt or no salt. Avoid dishes labeled as brined, pickled, cured, or smoked. Avoid dishes made with soy sauce, miso, or teriyaki sauce. Avoid foods that have monosodium glutamate (MSG) in them. MSG may be added to some restaurant food, sauces, soups, bouillon, and canned foods. Make meals that can be grilled, baked, poached, roasted, or steamed. These are often made with less sodium. General information Try to limit your sodium intake to 1,500-2,300 mg each day, or the amount told by your provider. What foods should I eat? Fruits Fresh, frozen, or canned fruit. Fruit juice. Vegetables Fresh or frozen vegetables. "No salt added" canned vegetables. "No salt added" tomato sauce and paste. Low-sodium or reduced-sodium tomato and vegetable juice. Grains Low-sodium cereals,  such as oats, puffed wheat and rice, and shredded wheat. Low-sodium crackers. Unsalted rice. Unsalted pasta. Low-sodium bread. Whole grain breads and whole grain pasta. Meats and other proteins Fresh or frozen meat, poultry, seafood, and fish. These should have no added salt. Low-sodium canned tuna and salmon. Unsalted nuts. Dried peas, beans, and  lentils without added salt. Unsalted canned beans. Eggs. Unsalted nut butters. Dairy Milk. Soy milk. Cheese that is naturally low in sodium, such as ricotta cheese, fresh mozzarella, or Swiss cheese. Low-sodium or reduced-sodium cheese. Cream cheese. Yogurt. Seasonings and condiments Fresh and dried herbs and spices. Salt-free seasonings. Low-sodium mustard and ketchup. Sodium-free salad dressing. Sodium-free light mayonnaise. Fresh or refrigerated horseradish. Lemon juice. Vinegar. Other foods Homemade, reduced-sodium, or low-sodium soups. Unsalted popcorn and pretzels. Low-salt or salt-free chips. The items listed above may not be all the foods and drinks you can have. Talk to a dietitian to learn more. What foods should I avoid? Vegetables Sauerkraut, pickled vegetables, and relishes. Olives. [place] fries. Onion rings. Regular canned vegetables, except low-sodium or reduced-sodium items. Regular canned tomato sauce and paste. Regular tomato and vegetable juice. Frozen vegetables in sauces. Grains Instant hot cereals. Bread stuffing, pancake, and biscuit mixes. Croutons. Seasoned rice or pasta mixes. Noodle soup cups. Boxed or frozen macaroni and cheese. Regular salted crackers. Self-rising flour. Meats and other proteins Meat or fish that is salted, canned, smoked, spiced, or pickled. Precooked or cured meat, such as sausages or meat loaves. Tanner [name]. Ham. Pepperoni. Hot dogs. Corned beef. Chipped beef. Salt pork. Jerky. Pickled herring, anchovies, and sardines. Regular canned tuna. Salted nuts. Dairy Processed cheese and cheese spreads. Hard cheeses. Cheese curds. Blue cheese. Feta cheese. String cheese. Regular cottage cheese. Buttermilk. Canned milk. Fats and oils Salted butter. Regular margarine. Ghee. Bacon fat. Seasonings and condiments Onion salt, garlic salt, seasoned salt, table salt, and sea salt. Canned and packaged gravies. Worcestershire sauce. Tartar sauce. Barbecue sauce. Teriyaki  sauce. Soy sauce, including reduced-sodium soy sauce. Steak sauce. Fish sauce. Oyster sauce. Cocktail sauce. Horseradish that you find on the shelf. Regular ketchup and mustard. Meat flavorings and tenderizers. Bouillon cubes. Hot sauce. Pre-made or packaged marinades. Pre-made or packaged taco seasonings. Relishes. Regular salad dressings. Salsa. Other foods Salted popcorn and pretzels. Corn chips and puffs. Potato and tortilla chips. Canned or dried soups. Pizza. Frozen entrees and pot pies. The items listed above may not be all the foods and drinks you should avoid. Talk to a dietitian to learn more. This information is not intended to replace advice given to you by your health care provider. Make sure you discuss any questions you have with your health care provider. Document Revised: 06/18/2022 Document Reviewed: 06/18/2022 Elsevier Patient Education  2024 [employer]  Heart-Healthy Eating Plan Many factors influence your heart health, including eating and exercise habits. Heart health is also called coronary health. Coronary risk increases with abnormal blood fat (lipid) levels. A heart-healthy eating plan includes limiting unhealthy fats, increasing healthy fats, limiting salt (sodium) intake, and making other diet and lifestyle changes. What is my plan? Your health care provider may recommend that: You limit your fat intake to _________% or less of your total calories each day. You limit your saturated fat intake to _________% or less of your total calories each day. You limit the amount of cholesterol in your diet to less than _________ mg per day. You limit the amount of sodium in your diet to less than _________ mg per day. What are tips for following  this plan? Cooking Cook foods using methods other than frying. Baking, boiling, grilling, and broiling are all good options. Other ways to reduce fat include: Removing the skin from poultry. Removing all visible fats from  meats. Steaming vegetables in water or broth. Meal planning  At meals, imagine dividing your plate into fourths: Fill one-half of your plate with vegetables and green salads. Fill one-fourth of your plate with whole grains. Fill one-fourth of your plate with lean protein foods. Eat 2-4 cups of vegetables per day. One cup of vegetables equals 1 cup (91 g) broccoli or cauliflower florets, 2 medium carrots, 1 large bell pepper, 1 large sweet potato, 1 large tomato, 1 medium white potato, 2 cups (150 g) raw leafy greens. Eat 1-2 cups of fruit per day. One cup of fruit equals 1 small apple, 1 large banana, 1 cup (237 g) mixed fruit, 1 large orange,  cup (82 g) dried fruit, 1 cup (240 mL) 100% fruit juice. Eat more foods that contain soluble fiber. Examples include apples, broccoli, carrots, beans, peas, and barley. Aim to get 25-30 g of fiber per day. Increase your consumption of legumes, nuts, and seeds to 4-5 servings per week. One serving of dried beans or legumes equals  cup (90 g) cooked, 1 serving of nuts is  oz (12 almonds, 24 pistachios, or 7 walnut halves), and 1 serving of seeds equals  oz (8 g). Fats Choose healthy fats more often. Choose monounsaturated and polyunsaturated fats, such as olive and canola oils, avocado oil, flaxseeds, walnuts, almonds, and seeds. Eat more omega-3 fats. Choose salmon, mackerel, sardines, tuna, flaxseed oil, and ground flaxseeds. Aim to eat fish at least 2 times each week. Check food labels carefully to identify foods with trans fats or high amounts of saturated fat. Limit saturated fats. These are found in animal products, such as meats, butter, and cream. Plant sources of saturated fats include palm oil, palm kernel oil, and coconut oil. Avoid foods with partially hydrogenated oils in them. These contain trans fats. Examples are stick margarine, some tub margarines, cookies, crackers, and other baked goods. Avoid fried foods. General information Eat  more home-cooked food and less restaurant, buffet, and fast food. Limit or avoid alcohol. Limit foods that are high in added sugar and simple starches such as foods made using white refined flour (white breads, pastries, sweets). Lose weight if you are overweight. Losing just 5-10% of your body weight can help your overall health and prevent diseases such as diabetes and heart disease. Monitor your sodium intake, especially if you have high blood pressure. Talk with your health care provider about your sodium intake. Try to incorporate more vegetarian meals weekly. What foods should I eat? Fruits All fresh, canned (in natural juice), or frozen fruits. Vegetables Fresh or frozen vegetables (raw, steamed, roasted, or grilled). Green salads. Grains Most grains. Choose whole wheat and whole grains most of the time. Rice and pasta, including brown rice and pastas made with whole wheat. Meats and other proteins Lean, well-trimmed beef, veal, pork, and lamb. Chicken and [place] without skin. All fish and shellfish. Wild duck, rabbit, pheasant, and venison. Egg whites or low-cholesterol egg substitutes. Dried beans, peas, lentils, and tofu. Seeds and most nuts. Dairy Low-fat or nonfat cheeses, including ricotta and mozzarella. Skim or 1% milk (liquid, powdered, or evaporated). Buttermilk made with low-fat milk. Nonfat or low-fat yogurt. Fats and oils Non-hydrogenated (trans-free) margarines. Vegetable oils, including soybean, sesame, sunflower, olive, avocado, peanut, safflower, corn, canola, and cottonseed. Salad  dressings or mayonnaise made with a vegetable oil. Beverages Water (mineral or sparkling). Coffee and tea. Unsweetened ice tea. Diet beverages. Sweets and desserts Sherbet, gelatin, and fruit ice. Small amounts of dark chocolate. Limit all sweets and desserts. Seasonings and condiments All seasonings and condiments. The items listed above may not be a complete list of foods and beverages  you can eat. Contact a dietitian for more options. What foods should I avoid? Fruits Canned fruit in heavy syrup. Fruit in cream or butter sauce. Fried fruit. Limit coconut. Vegetables Vegetables cooked in cheese, cream, or butter sauce. Fried vegetables. Grains Breads made with saturated or trans fats, oils, or whole milk. Croissants. Sweet rolls. Donuts. High-fat crackers, such as cheese crackers and chips. Meats and other proteins Fatty meats, such as hot dogs, ribs, sausage, bacon, rib-eye roast or steak. High-fat deli meats, such as salami and bologna. Caviar. Domestic duck and goose. Organ meats, such as liver. Dairy Cream, sour cream, cream cheese, and creamed cottage cheese. Whole-milk cheeses. Whole or 2% milk (liquid, evaporated, or condensed). Whole buttermilk. Cream sauce or high-fat cheese sauce. Whole-milk yogurt. Fats and oils Meat fat, or shortening. Cocoa butter, hydrogenated oils, palm oil, coconut oil, palm kernel oil. Solid fats and shortenings, including bacon fat, salt pork, lard, and butter. Nondairy cream substitutes. Salad dressings with cheese or sour cream. Beverages Regular sodas and any drinks with added sugar. Sweets and desserts Frosting. Pudding. Cookies. Cakes. Pies. Milk chocolate or white chocolate. Buttered syrups. Full-fat ice cream or ice cream drinks. The items listed above may not be a complete list of foods and beverages to avoid. Contact a dietitian for more information. Summary Heart-healthy meal planning includes limiting unhealthy fats, increasing healthy fats, limiting salt (sodium) intake and making other diet and lifestyle changes. Lose weight if you are overweight. Losing just 5-10% of your body weight can help your overall health and prevent diseases such as diabetes and heart disease. Focus on eating a balance of foods, including fruits and vegetables, low-fat or nonfat dairy, lean protein, nuts and legumes, whole grains, and heart-healthy oils  and fats. This information is not intended to replace advice given to you by your health care provider. Make sure you discuss any questions you have with your health care provider. Document Revised: 07/07/2021 Document Reviewed: 07/07/2021 Elsevier Patient Education  2024 [employer].

## 2022-12-02 ENCOUNTER — Encounter (HOSPITAL_COMMUNITY): Payer: Self-pay

## 2022-12-02 ENCOUNTER — Other Ambulatory Visit: Payer: Self-pay

## 2022-12-02 ENCOUNTER — Other Ambulatory Visit (INDEPENDENT_AMBULATORY_CARE_PROVIDER_SITE_OTHER): Payer: Self-pay

## 2022-12-02 ENCOUNTER — Telehealth (INDEPENDENT_AMBULATORY_CARE_PROVIDER_SITE_OTHER): Payer: Self-pay

## 2022-12-02 ENCOUNTER — Ambulatory Visit (INDEPENDENT_AMBULATORY_CARE_PROVIDER_SITE_OTHER): Payer: Commercial Managed Care - PPO | Admitting: Family Medicine

## 2022-12-02 ENCOUNTER — Encounter (INDEPENDENT_AMBULATORY_CARE_PROVIDER_SITE_OTHER): Payer: Self-pay | Admitting: Family Medicine

## 2022-12-02 ENCOUNTER — Encounter (INDEPENDENT_AMBULATORY_CARE_PROVIDER_SITE_OTHER): Payer: Self-pay

## 2022-12-02 ENCOUNTER — Inpatient Hospital Stay: Payer: Medicare HMO | Attending: Hematology & Oncology

## 2022-12-02 ENCOUNTER — Encounter (HOSPITAL_COMMUNITY)
Admission: RE | Admit: 2022-12-02 | Discharge: 2022-12-02 | Disposition: A | Discharge status: Home / Self Care | Payer: Medicare HMO | Source: Ambulatory Visit | Attending: Orthopedic Surgery | Admitting: Orthopedic Surgery

## 2022-12-02 ENCOUNTER — Encounter: Payer: Self-pay | Admitting: Family

## 2022-12-02 ENCOUNTER — Inpatient Hospital Stay: Payer: Medicare HMO | Admitting: Family

## 2022-12-02 VITALS — BP 133/48 | HR 73 | Temp 98.2°F | Resp 18 | Wt 235.1 lb

## 2022-12-02 VITALS — BP 160/62 | HR 69 | Temp 98.5°F | Resp 16 | Ht 65.5 in | Wt 235.0 lb

## 2022-12-02 VITALS — BP 138/78 | HR 52 | Ht 71.0 in | Wt 240.2 lb

## 2022-12-02 DIAGNOSIS — N400 Enlarged prostate without lower urinary tract symptoms: Secondary | ICD-10-CM | POA: Insufficient documentation

## 2022-12-02 DIAGNOSIS — Z7901 Long term (current) use of anticoagulants: Secondary | ICD-10-CM | POA: Insufficient documentation

## 2022-12-02 DIAGNOSIS — N183 Chronic kidney disease, stage 3 unspecified: Secondary | ICD-10-CM | POA: Insufficient documentation

## 2022-12-02 DIAGNOSIS — Z86718 Personal history of other venous thrombosis and embolism: Secondary | ICD-10-CM | POA: Insufficient documentation

## 2022-12-02 DIAGNOSIS — M1711 Unilateral primary osteoarthritis, right knee: Secondary | ICD-10-CM | POA: Insufficient documentation

## 2022-12-02 DIAGNOSIS — I130 Hypertensive heart and chronic kidney disease with heart failure and stage 1 through stage 4 chronic kidney disease, or unspecified chronic kidney disease: Secondary | ICD-10-CM | POA: Insufficient documentation

## 2022-12-02 DIAGNOSIS — Z01818 Encounter for other preprocedural examination: Secondary | ICD-10-CM | POA: Insufficient documentation

## 2022-12-02 DIAGNOSIS — Z79899 Other long term (current) drug therapy: Secondary | ICD-10-CM | POA: Insufficient documentation

## 2022-12-02 DIAGNOSIS — I509 Heart failure, unspecified: Secondary | ICD-10-CM | POA: Insufficient documentation

## 2022-12-02 DIAGNOSIS — E119 Type 2 diabetes mellitus without complications: Secondary | ICD-10-CM

## 2022-12-02 DIAGNOSIS — Z86711 Personal history of pulmonary embolism: Secondary | ICD-10-CM | POA: Insufficient documentation

## 2022-12-02 DIAGNOSIS — E1122 Type 2 diabetes mellitus with diabetic chronic kidney disease: Secondary | ICD-10-CM | POA: Insufficient documentation

## 2022-12-02 DIAGNOSIS — I10 Essential (primary) hypertension: Secondary | ICD-10-CM

## 2022-12-02 DIAGNOSIS — R600 Localized edema: Secondary | ICD-10-CM

## 2022-12-02 DIAGNOSIS — Z7189 Other specified counseling: Secondary | ICD-10-CM

## 2022-12-02 DIAGNOSIS — J449 Chronic obstructive pulmonary disease, unspecified: Secondary | ICD-10-CM

## 2022-12-02 DIAGNOSIS — E782 Mixed hyperlipidemia: Secondary | ICD-10-CM

## 2022-12-02 DIAGNOSIS — I4892 Unspecified atrial flutter: Secondary | ICD-10-CM

## 2022-12-02 HISTORY — DX: Heart failure, unspecified: I50.9

## 2022-12-02 LAB — CMP (CANCER CENTER ONLY)
ALT: 12 U/L (ref 0–44)
AST: 15 U/L (ref 15–41)
Albumin: 3.8 g/dL (ref 3.5–5.0)
Alkaline Phosphatase: 74 U/L (ref 38–126)
Anion gap: 8 (ref 5–15)
BUN: 19 mg/dL (ref 8–23)
CO2: 32 mmol/L (ref 22–32)
Calcium: 9.4 mg/dL (ref 8.9–10.3)
Chloride: 103 mmol/L (ref 98–111)
Creatinine: 1.87 mg/dL — ABNORMAL HIGH (ref 0.61–1.24)
GFR, Estimated: 36 mL/min — ABNORMAL LOW (ref >60)
Glucose, Bld: 105 mg/dL — ABNORMAL HIGH (ref 70–99)
Potassium: 4.6 mmol/L (ref 3.5–5.1)
Sodium: 143 mmol/L (ref 135–145)
Total Bilirubin: 0.7 mg/dL (ref 0.3–1.2)
Total Protein: 6.1 g/dL — ABNORMAL LOW (ref 6.5–8.1)

## 2022-12-02 LAB — CBC WITH DIFFERENTIAL (CANCER CENTER ONLY)
Abs Immature Granulocytes: 0.04 10*3/uL (ref 0.00–0.07)
Basophils Absolute: 0.1 10*3/uL (ref 0.0–0.1)
Basophils Relative: 1 %
Eosinophils Absolute: 0.2 10*3/uL (ref 0.0–0.5)
Eosinophils Relative: 3 %
HCT: 42.9 % (ref 39.0–52.0)
Hemoglobin: 13.1 g/dL (ref 13.0–17.0)
Immature Granulocytes: 0 %
Lymphocytes Relative: 16 %
Lymphs Abs: 1.5 10*3/uL (ref 0.7–4.0)
MCH: 26.7 pg (ref 26.0–34.0)
MCHC: 30.5 g/dL (ref 30.0–36.0)
MCV: 87.4 fL (ref 80.0–100.0)
Monocytes Absolute: 0.7 10*3/uL (ref 0.1–1.0)
Monocytes Relative: 7 %
Neutro Abs: 7.1 10*3/uL (ref 1.7–7.7)
Neutrophils Relative %: 73 %
Platelet Count: 196 10*3/uL (ref 150–400)
RBC: 4.91 MIL/uL (ref 4.22–5.81)
RDW: 14.8 % (ref 11.5–15.5)
WBC Count: 9.6 10*3/uL (ref 4.0–10.5)
nRBC: 0 % (ref 0.0–0.2)

## 2022-12-02 LAB — SURGICAL PCR SCREEN
MRSA, PCR: NEGATIVE
Staphylococcus aureus: NEGATIVE

## 2022-12-02 LAB — HEMOGLOBIN A1C
Hgb A1c MFr Bld: 4.8 % (ref 4.8–5.6)
Mean Plasma Glucose: 91.06 mg/dL

## 2022-12-02 LAB — GLUCOSE, CAPILLARY: Glucose-Capillary: 114 mg/dL — ABNORMAL HIGH (ref 70–99)

## 2022-12-02 MED ORDER — APIXABAN 5 MG TABLET
5.0000 mg | ORAL_TABLET | Freq: Two times a day (BID) | ORAL | 3 refills | Status: DC
Start: 2022-12-02 — End: 2023-10-18

## 2022-12-02 NOTE — Progress Notes (Addendum)
COVID Vaccine Completed:  Yes   Date of COVID positive in last 90 days: no   PCP - Hillard Danker, MD Cardiologist - Carolan Clines, MD.  Appt scheduled for 12-01-22   Cardiac clearance in Epic dated 08-31-22 by Dr. Wyline Mood   Medical clearance on chart dated 11-12-22   Chest x-ray - 08-18-22 Epic EKG - 08-20-22 Epic Stress Test - n/a ECHO - 08-19-22 Epic Cardiac Cath - n/a Pacemaker/ICD device last checked: no Spinal Cord Stimulator: no Long Term Monitor - 01-01-22 Epic   Bowel Prep - no   Sleep Study - Yes, +sleep apnea CPAP - no   Fasting Blood Sugar - no checks at home Checks Blood Sugar     Semaglutide Last dose of GLP1 agonist-  12-12-22 GLP1 instructions:  Hold x7 days   Jardiance Last dose of SGLT-2 inhibitors-  12-11-22 SGLT-2 instructions: Hold x3 days     Blood Thinner Instructions:  Eliquis, hold 2 days Aspirin Instructions: Last Dose: 12/12/22 2100   Activity level:   Can go up a flight of stairs and perform activities of daily living without stopping and without symptoms of chest pain or shortness of breath.               Anesthesia review:  Afib, heart failure, mod mitral regurg, RBBB, HTN, DM, CKD, skin tear to right forearm, healing well per pt, creatinine 1.87   Patient denies shortness of breath, fever, cough and chest pain at PAT appointment   Patient verbalized understanding of instructions that were given to them at the PAT appointment. Patient was also instructed that they will need to review over the PAT instructions again at home before surgery

## 2022-12-02 NOTE — Progress Notes (Signed)
Hematology and Oncology Follow Up Visit  Tanner [name] [id number] [birth year] 77 y.o. 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 He is doing fairly well but has a lot of pain in the right knee which has caused a few falls. Thankfully he states that he has not been injured.  He will be having a total knee arthroplasty on 12/15/2022.  We will have him hold his Eliquis starting 2 days prior to surgery and then he can resume taking the day after. I wrote these instructions down for him to take home and he verbalized understanding.  He has not had any issues with blood loss. He has some bruising on his arms but not excessive. No petechiae.  Mild SOB with over exertion resolves with taking a moment to rest.  No fever, chills, n/v, cough, rash, dizziness, chest pain, palpitations, abdominal pain or changes in bowel or bladder habits.  Appetite and hydration have been good. Weight is stable at 235 lbs.   ECOG Performance Status: 1 - Symptomatic but completely ambulatory  Medications:  Allergies as of 12/02/2022       Reactions   Lisinopril Other (See Comments)    Renal impairment   Amlodipine Swelling   Codeine Sulfate Itching, Nausea Only        Medication List        Accurate as of December 02, 2022  9:59 AM. If you have any questions, ask your nurse or doctor.          acetaminophen 500 MG tablet Commonly known as: TYLENOL Take 1,000 mg by mouth every 6 (six) hours as needed for moderate pain.   acidophilus Caps capsule Take 1 capsule by mouth daily.   amiodarone 200 MG tablet Commonly known as: PACERONE Take 1 tablet (200 mg total) by mouth daily.   atorvastatin 80 MG tablet Commonly known as: LIPITOR Take 1 tablet (80 mg total) by mouth daily.   buPROPion 150 MG 12 hr tablet Commonly known as:  WELLBUTRIN SR Take 150 mg by mouth 2 (two) times daily.   carvedilol 6.25 MG tablet Commonly known as: COREG Take 1 tablet (6.25 mg total) by mouth 2 (two) times daily with a meal.   clobetasol ointment 0.05 % Commonly known as: TEMOVATE Apply 1 Application topically daily as needed (irritation).   Eliquis 5 MG Tabs tablet Generic drug: apixaban Take 5 mg by mouth 2 (two) times daily.   empagliflozin 10 MG Tabs tablet Commonly known as: JARDIANCE Take 1 tablet (10 mg total) by mouth daily.   finasteride 5 MG tablet Commonly known as: PROSCAR Take 5 mg by mouth at bedtime.   gabapentin 300 MG capsule Commonly known as: NEURONTIN Take 300-600 mg by mouth See admin instructions. Take 600 mg by mouth in the morning, afternoon and evening and take 300 mg at night   lidocaine 2 % solution Commonly known as: XYLOCAINE Use as directed 15 mLs in the mouth or throat as needed for mouth pain.   multivitamins ther. w/minerals Tabs tablet Take 1 tablet by mouth daily.   nortriptyline 10 MG capsule Commonly known as: PAMELOR 2 CAPSULES EVERY NIGHT AT BEDTIME   saccharomyces boulardii 250 MG capsule Commonly known as: FLORASTOR Take 1 capsule (250 mg total) by mouth 2 (two) times daily.  Semaglutide (1 MG/DOSE) 4 MG/3ML Sopn Inject 1 mg as directed once a week.   Semaglutide (2 MG/DOSE) 8 MG/3ML Sopn Inject 2 mg as directed once a week.   sildenafil 100 MG tablet Commonly known as: VIAGRA Take 100 mg by mouth as needed for erectile dysfunction.   Thera-Gesic Plus Crea Apply 1 application topically at bedtime. Apply to lower back   torsemide 20 MG tablet Commonly known as: DEMADEX Take 1 tablet (20 mg total) by mouth daily.        Allergies:  Allergies  Allergen Reactions   Lisinopril Other (See Comments)     Renal impairment   Amlodipine Swelling   Codeine Sulfate Itching and Nausea Only    Past Medical History, Surgical history, Social history, and Family  History were reviewed and updated.  Review of Systems: All other 10 point review of systems is negative.   Physical Exam:  weight is 235 lb 1.9 oz (106.6 kg). His oral temperature is 98.2 F (36.8 C). His blood pressure is 133/48 (abnormal) and his pulse is 73. His respiration is 18 and oxygen saturation is 92%.   Wt Readings from Last 3 Encounters:  12/02/22 235 lb 1.9 oz (106.6 kg)  12/01/22 236 lb 12.8 oz (107.4 kg)  10/27/22 240 lb (108.9 kg)    Ocular: Sclerae unicteric, pupils equal, round and reactive to light Ear-nose-throat: Oropharynx clear, dentition fair Lymphatic: No cervical or supraclavicular adenopathy Lungs no rales or rhonchi, good excursion bilaterally Heart regular rate and rhythm, no murmur appreciated Abd soft, nontender, positive bowel sounds MSK no focal spinal tenderness, no joint edema Neuro: non-focal, well-oriented, appropriate affect Breasts: Deferred   Lab Results  Component Value Date   WBC 9.6 12/02/2022   HGB 13.1 12/02/2022   HCT 42.9 12/02/2022   MCV 87.4 12/02/2022   PLT 196 12/02/2022   Lab Results  Component Value Date   FERRITIN 80 10/12/2019   IRON 86 10/12/2019   TIBC 297 10/12/2019   UIBC 211 10/12/2019   IRONPCTSAT 29 10/12/2019   Lab Results  Component Value Date   RETICCTPCT 2.5 10/12/2019   RBC 4.91 12/02/2022   No results found for: "KPAFRELGTCHN", "LAMBDASER", "KAPLAMBRATIO" Lab Results  Component Value Date   IGGSERUM 549 (L) 04/07/2017   IGMSERUM 144 04/07/2017   Lab Results  Component Value Date   ALBUMINELP 3.7 (L) 04/07/2017   A1GS 0.4 (H) 04/07/2017   A2GS 0.8 04/07/2017   BETS 0.5 04/07/2017   BETA2SER 0.3 04/07/2017   GAMS 0.6 (L) 04/07/2017   SPEI  04/07/2017     Comment:     . One or more serum protein fractions are outside the normal ranges.  No abnormal protein bands are apparent. .      Chemistry      Component Value Date/Time   NA 143 12/02/2022 0902   NA 143 01/23/2020 1603   NA 136  12/29/2013 1356   K 4.6 12/02/2022 0902   K 3.4 12/29/2013 1356   CL 103 12/02/2022 0902   CL 97 (L) 12/29/2013 1356   CO2 32 12/02/2022 0902   CO2 33 12/29/2013 1356   BUN 19 12/02/2022 0902   BUN 16 01/23/2020 1603   BUN 15 12/29/2013 1356   CREATININE 1.87 (H) 12/02/2022 0902   CREATININE 1.2 12/29/2013 1356   GLU 84 11/07/2014 0000      Component Value Date/Time   CALCIUM 9.4 12/02/2022 0902   CALCIUM 8.9 12/29/2013 1356   ALKPHOS 74  12/02/2022 0902   ALKPHOS 59 12/29/2013 1356   AST 15 12/02/2022 0902   ALT 12 12/02/2022 0902   ALT 27 12/29/2013 1356   BILITOT 0.7 12/02/2022 0902       Impression and Plan: Tanner [name] is a very pleasant 77 yo caucasian gentleman with recurrent pulmonary embolism/thromboembolic disease. He is on lifelong Eliquis.  He is doing well and so far there has been do evidence of recurrence.  He was given the above instructions for Eliquis prior to and after surgery.  Follow-up in 6 months.  He can contact our office with any questions or concerns. We can certainly see him sooner if needed.   Eileen Stanford, NP 6/19/20249:59 AM

## 2022-12-02 NOTE — Nursing Note (Signed)
POPULATION HEALTH    COMPLEX CARE MANAGEMENT    Chronic Care Management - CCM  Status: Enrolled  Effective Dates: 12/02/2022 - present  Responsible Staff: Alden Benjamin, RN        Thomas [name] reports that he is feeling good. Thomas [name] had PCP appointment today with Dr. Gordan Payment. Thomas [name] is a widow and does have a daughter out of town that is his social support. Thomas [name] lives alone in a home with no financial / transportation/ or home concerns. Thomas [name] does have CAD and Hypertension. He checks his blood pressure at home usually 2 times a day. Last BP in office today   BP Readings from Last 1 Encounters:   12/02/22 138/78     Thomas [name] is compliant with taking medications. He does state there are times when his BP runs low and feels dizzy . PCP is happy with medications at this point. Thomas [name] has recently lost 30lbs. Thomas [name] has increase heart healthy activity and tries to make healthy diet choices. Thomas [name] denies SOB/CP Palpatations. Thomas [name] follows Urology and has upcoming appointment 6/28. Thomas [name] has Cardiac appointment 8/7. Thomas [name] is interested in MOW. Thomas [name] is also concerned about the construction preventing him form his pharmacy 1 mile away. He states the alternates would be too far. Is interested in mail order for the the next few months until able to use [facility].     Care Management Tasks Completed:  Chart review completed.  Full intake assessment completed.  Social determinants of health completed.  Care plan initiated.  Goals addressed and updated.  Education completed.  Self management plan initiated and provided to Thomas [name].  See Thomas [name] care coordination note.  Barriers to health, care plan, and goals identified.  Interventions addressed below as applicable.  Provided Thomas [name] with direct contact information and 24 hour nurse navigator number.  Provided Thomas [name] with enrollment pamphlet, business card, and enrollment letter.Health maintenance reviewed and discussed with Thomas [name].  Health maintenance updated as  applicable.  Discussed upcoming appointment dates and times.  Reinforced importance of keeping all provider appointments.  Reinforced the importance of taking all medications as prescribed.  Medical history, surgical history, hospitalizations, and medications reviewed and updated as applicable.    ASSESSMENT INTERVENTIONS:  SDOH   Lives at home alone no housing, finaicial, or transportation concerns.    Community Resources  Currently does not use any community resources is interested in meals on wheels.    Visual & Hearing  Denies any visual or hearing deficits.    ADLs  Completes ADL'S independently no assistive equipment.    Life Planning Activities   No POA or MLW    Cultural and Linguistic Needs  No current cultural or Linguistic needs.     Caregiver Resources:  Thomas [name] is close with daughter who comes in to visit Thomas [name] is a widow and also lost a daughter.    Behavioral Health Status  No current Behavorial health Needs.     Assessment of Benefits:  Healthcare Benefits:Value Based Agreement  Healthcare Benefits Assessment:Assessed and adequate to cover care    Additional Interventions:  Educated on Hypertension  Reviewed BP   Provided Thomas [name] with BP log  Reviewed heart healthy Diet and activity   Reviewed recent weight loss  Discussed upcoming Urology Appointment  Referral to Pharmacy   Referral to [facility] Meals on Wheels   Discussed another pharmacy option since road to [facility] is under construction/ Possible mail order ?   Educated on CAD     Case Manager To Do List  for the Next Interaction:  Monthly outreachF/U MOW  F/U Pharmacy delivery  F/U BP   F/U BP Medications Dizziness  F/U Urology Appointment  Review Care Gaps     Plan to call Thomas [name] in ~ one month to reassess and update plan of care.  Instructed Thomas [name] to call with change in symptoms or as needed prior to next follow up.      Alden Benjamin, RN   Disease Management Coordinator  Population Health  [phone]  Chronic Care Management Consent  He  meets criteria for Chronic Care Management (CCM) services, as he has multiple chronic conditions and is at risk for hospitalization, deterioration of function and/or exacerbation.    Thomas [name] was given information about Chronic Care Management services today including:   CCM service includes personalized support from designated clinical staff supervised by his physician, including individualized plan of care and coordination with other care providers  24/7 contact phone numbers for assistance for urgent and routine care needs.  Service will only be billed when office clinical staff spend 20 minutes or more in a month to coordinate care.  Only one practitioner may furnish and bill the service in a calendar month.  The Thomas [name] may stop CCM services at any time (effective at the end of the month) by phone call to the office staff.  The Thomas [name] will be responsible for cost sharing (co-pay) of up to 20% of the service fee (after annual deductible is met).    The Thomas [name] agrees to Chronic Care Management services.  Continuity Practitioner (for routine appointments): Rhoderick Moody, DO

## 2022-12-02 NOTE — Nursing Note (Signed)
Care Coordination Identified Note    DATE: 12/02/2022, 13:28  PATIENT NAME: Thomas [name]  MRN: [id number]  DOB: [birth year]  PCP: Rhoderick Moody, DO  INSURANCE: Payor: [employer] MEDICARE / Plan: [employer] CHOICE PPO / Product Type: PPO /     The Disease Management Coordinator has marked  this patient as identified at this time for potential enrollment. Chart review completed.  A future outgoing call will be made to patient within 48 hours to discuss Care Coordination and obtain verbal consent if interested.     Alden Benjamin, RN  12/02/2022  13:28

## 2022-12-02 NOTE — Progress Notes (Signed)
Thomas [name] is a 77 year old male who presents today for a six-month follow-up of hypertension and hyperlipidemia and chronic medical problems.  The patient reports current adherence to recommended diet is good.  States that he has lost about 30 lb over all.  The pt is compliant with medicine.  He does follow with urology routinely for history of BPH and he was having some issues with his stream.  They did do a cystoscopy and apparently saw something compressive so did a ultrasound of the prostate.  He does have a follow-up coming up with them next week.  The patient already did look at his results on his MyChart account and told me that his prostate was found to be of normal size and no mass.  He states his urinary symptoms seem to be better and he continues to take Proscar and Uroxatral.  He has a follow-up coming up with them next week.  Regarding HTN:  He continues on a low dose of losartan he is already on diltiazem for rate control.  He said because of his weight loss he was hoping maybe he could go off the medications however his blood pressure was initially elevated when he came in today.  The following symptoms are also present: none, and he also denies the following symptoms: headache, blurred vision, chest discomfort, shortness of breath and palpitations.  Side effects of medication:none.  He does check his blood pressures at home and he does usually get numbers in the 130s and 140s and sometimes as low as 120.   Regarding lipid treatment: He is taking Crestor 10 mg and is having no side effects.  Lab Results   Component Value Date    CHOLESTEROL 130 11/19/2022    HDLCHOL 71 11/19/2022    LDLCHOL 48 11/19/2022    TRIG 45 11/19/2022      He does have a history of atrial flutter and has follow-up with cardiology. He continues on his Eliquis as his anticoagulant and a calcium channel blocker for rate control.  He also continues on flecainide.  He denies having any bleeding.  He denies any palpitations,  lightheadedness, dizziness or chest pain.   he does have a history of chronic obstructive pulmonary disease and follows with pulmonology.  They did put him on Spiriva but he has not been taking that medication.  He has not having any pulmonary symptoms at this time.  He does have history of lower extremity edema and continues to take Lasix and potassium.  This does help control his swelling.  Kidney function has been excellent    Most recent Cologuard 10/31/2020, negative    REVIEW OF SYSTEMS:  GENERAL: negative for fevers/chills, fatigue, significant weight change, sleep disturbance  HEENT:denies visual changes, sore throat or URI symptoms  RESPIRATORY:  Currently denying shortness of breath, cough or wheezing  CARDIAC:  Negative for chest pain or palpitations  GI: no nausea/vomitting/diarrhea, no abdominal pain  MUSCULOSKELETAL: no myalgias or arthragias, no recent trauma  NEUROLOGIC: no headache, visual changes or mental status changes  GU: no urinary symptoms or CVA tenderness      No Known Allergies    Current Outpatient Medications   Medication Sig    acetaminophen (TYLENOL) 500 mg Oral Tablet Take 1 Tablet (500 mg total) by mouth Every 4 hours as needed for Pain (pt takes tylenol pm)    alfuzosin (UROXATRAL) 10 mg Oral Tablet Sustained Release 24 hr TAKE ONE TABLET BY MOUTH EVERY DAY    apixaban (  ELIQUIS) 5 mg Oral Tablet Take 1 Tablet (5 mg total) by mouth Twice daily SHIP TO PATIENT,   [id number]  STATE LICENSE- [id number]  NPI- [id number]    diltiazem HCl (TIAZAC) 240 mg Oral Capsule,Sustained Action 24 hr TAKE ONE CAPSULE BY MOUTH DAILY    finasteride (PROSCAR) 5 mg Oral Tablet TAKE ONE TABLET BY MOUTH EVERY DAY    flecainide (TAMBOCOR) 100 mg Oral Tablet TAKE ONE TABLET BY MOUTH TWICE DAILY    furosemide (LASIX) 40 mg Oral Tablet TAKE ONE TABLET BY MOUTH EVERY DAY    losartan (COZAAR) 50 mg Oral Tablet Take 1 Tablet (50 mg total) by mouth Once a day    nitroGLYCERIN (NITROSTAT) 0.4 mg Sublingual  Tablet, Sublingual Place 1 Tablet (0.4 mg total) under the tongue Every 5 minutes as needed for Chest pain    potassium chloride (KLOR-CON) 10 mEq Oral Tablet Sustained Release TAKE ONE TABLET BY MOUTH EVERY DAY    rosuvastatin (CRESTOR) 10 mg Oral Tablet Take 1 Tablet (10 mg total) by mouth Every evening     Past Medical History:   Diagnosis Date    Arthritis     Atrial flutter (CMS HCC)     Cancer (CMS HCC)     Dysuria     Enteritis due to Rotavirus     Gout     Headache     Hemorrhoid     Hypercholesterolemia     Hypertension          Past Surgical History:   Procedure Laterality Date    HX HEMORRHOIDECTOMY           Social History     Tobacco Use    Smoking status: Former     Types: Cigars    Smokeless tobacco: Current     Types: Chew, Snuff   Substance Use Topics    Alcohol use: Yes     Alcohol/week: 2.0 standard drinks of alcohol     Types: 2 Cans of beer per week     Comment: few days a week        PHYSICAL EXAM:   The patient appears to be in no acute distress.  Vitals: BP 138/78 (Site: Left, Patient Position: Sitting, Cuff Size: Adult Large)   Pulse 52   Ht 1.803 m (5\' 11" )   Wt 109 kg (240 lb 3.2 oz)   SpO2 96%   BMI 33.50 kg/m      Respiratory: clear to ascultation B/L, no respiratory distress, equal BS throughout, no bibasilar rales  Heart: normal sinus rhythm today, no murmur, no edema, normal peripheral pulses  Abdomen: soft, nontender, positive bowel sounds  Extremities: no edema, no calf pain or tenderness  Neuro: AAOx3, cranial nerves's intact, DTR's 2/4 throughout  Skin: warm and dry, no rashes or lesions    ASSESSMENT:     ICD-10-CM    1. Essential hypertension  I10 ALT (SGPT)-blood pressure is very well controlled.  I explained to the patient that he can not go down on his medications because his blood pressure would certainly be 2 elevated if he did.  Will let us know if he has any symptoms of hypotension-     AST (SGOT)     BASIC METABOLIC PANEL, FASTING     LIPID PANEL     CBC      2.  Mixed hyperlipidemia  E78.2 ALT (SGPT)-well well controlled with Crestor     AST (SGOT)     LIPID PANEL  3. Paroxysmal atrial flutter (CMS HCC)  I48.92 -continue flecainide, Eliquis and diltiazem and follow up with Cardiology.  No symptoms currently.      4. Edema of both lower legs  R60.0 Continue Lasix and potassium      5. Chronic obstructive pulmonary disease, unspecified COPD type (CMS HCC)  J44.9 He does follow up with pulmonology and has an appointment coming up in August.  He is no longer taking the Spiriva.  He uses a rescue inhaler that he uses as needed.  No pulmonary symptoms today.      6. Benign prostatic hyperplasia  N40.0 Continue following up with Urology as an appointment to discuss his ultrasound results next week.         PLAN:   I reviewed appropriate screenings and HCM with patient.    Orders Placed This Encounter    ALT (SGPT)    AST (SGOT)    BASIC METABOLIC PANEL, FASTING    LIPID PANEL    CBC     Medication: continue current medication regimen unchanged  Return in about 6 months (around 06/03/2023), or for medicare wellness (last was 06/02/22), for blood work first.

## 2022-12-02 NOTE — Progress Notes (Signed)
Population Health    Reason for Encounter: New Patient Medication Assistance    I was sent a referral from [facility], Alden Benjamin to assist patient with retrieving medications during construction that will be taking place that will prevent him access to the pharmacy. I called and spoke with Annabelle Harman at [facility]. I asked if there was any way to mail his prescriptions as this was construction was preventing him from being able to travel to the pharmacy. She stated that they can not mail but they were willing to make an exception and deliver his medications to him free of charge during this time. She documented this in his profile there and I passed along this information to [place] and told him to just also remind them to deliver any medication that he requests or that is refilled.    Additionally, I referenced his [facility] plan and called and verified that his plan has the DOAC program as a benefit to the patient. I called and made him an appointment for a [employer] representative to call him Friday 6/22 from 12-1:00 to discuss this benefit where he can get Eliquis free of charge through mail order and mailed at no charge. I told [name] this and he was extremely grateful as he was not aware of this program or benefit. I provided him with the [facility] DOAC Program phone number [phone]) and let him know of the appointment I scheduled for him and let him know he could call and change it to a more convenient time if that did not work for him. Also, I sent the Patient Navigators a referral for Extra Help as I have not seen an application or referral for this and wanted to see if he would qualify for assistance with all of his medications.    I left Bill my contact information for any future assistance I can provide or any questions or concerns that I can assist him with. Thank you for allowing me to care for this patient and for all that you do!  Please feel free to contact me with any questions or concerns or if there is anything I  can do to further assist you or Bill at this time or in the future.    Thanks!  Darlys Gales, PHARMD      No results found for: "HA1C"  Lab Results   Component Value Date    CHOLESTEROL 130 11/19/2022    HDLCHOL 71 11/19/2022    LDLCHOL 48 11/19/2022    TRIG 45 11/19/2022      No results for input(s): "MICALBRNUR", "MICALBCRERAT" in the last 09811 hours.      THYROID STIMULATING HORMONE  Lab Results   Component Value Date    TSH 1.665 05/01/2022           Current Outpatient Medications   Medication Sig    acetaminophen (TYLENOL) 500 mg Oral Tablet Take 1 Tablet (500 mg total) by mouth Every 4 hours as needed for Pain (pt takes tylenol pm)    alfuzosin (UROXATRAL) 10 mg Oral Tablet Sustained Release 24 hr TAKE ONE TABLET BY MOUTH EVERY DAY    apixaban (ELIQUIS) 5 mg Oral Tablet Take 1 Tablet (5 mg total) by mouth Twice daily SHIP TO PATIENT,   [id number]  STATE LICENSE- [id number] L  NPI- [id number]    diltiazem HCl (TIAZAC) 240 mg Oral Capsule,Sustained Action 24 hr TAKE ONE CAPSULE BY MOUTH DAILY    finasteride (PROSCAR) 5 mg Oral Tablet TAKE ONE TABLET BY MOUTH  EVERY DAY    flecainide (TAMBOCOR) 100 mg Oral Tablet TAKE ONE TABLET BY MOUTH TWICE DAILY    furosemide (LASIX) 40 mg Oral Tablet TAKE ONE TABLET BY MOUTH EVERY DAY    losartan (COZAAR) 50 mg Oral Tablet Take 1 Tablet (50 mg total) by mouth Once a day    nitroGLYCERIN (NITROSTAT) 0.4 mg Sublingual Tablet, Sublingual Place 1 Tablet (0.4 mg total) under the tongue Every 5 minutes as needed for Chest pain    potassium chloride (KLOR-CON) 10 mEq Oral Tablet Sustained Release TAKE ONE TABLET BY MOUTH EVERY DAY    rosuvastatin (CRESTOR) 10 mg Oral Tablet Take 1 Tablet (10 mg total) by mouth Every evening

## 2022-12-02 NOTE — Telephone Encounter (Signed)
Population Health    I called and spoke with a [employer] representative for the DOAC Program to check eligibility and his plan has this as a benefit and scheduled an appointment for Friday, June 22 at 12:00 to call the patient with information on enrollment,    Thanks!  Darlys Gales, PHARMD

## 2022-12-02 NOTE — Nursing Note (Signed)
Pt here for 6 month follow up. Pt would like to discuss if he can stop any meds due to recent weight loss.

## 2022-12-03 ENCOUNTER — Telehealth: Payer: Self-pay

## 2022-12-03 ENCOUNTER — Other Ambulatory Visit (INDEPENDENT_AMBULATORY_CARE_PROVIDER_SITE_OTHER): Payer: Self-pay

## 2022-12-03 ENCOUNTER — Encounter (INDEPENDENT_AMBULATORY_CARE_PROVIDER_SITE_OTHER): Payer: Self-pay

## 2022-12-03 DIAGNOSIS — Z7189 Other specified counseling: Secondary | ICD-10-CM

## 2022-12-03 NOTE — Progress Notes (Signed)
Creatinine 1.87 results routed to Dr. Charlann Boxer

## 2022-12-03 NOTE — Patient Outreach (Signed)
  Care Coordination   12/03/2022 Name: Tanner [name] MRN: [id number] DOB: 10/04/1945   Care Coordination Outreach Attempts:  An unsuccessful telephone outreach was attempted for a scheduled appointment today.  Follow Up Plan:  Additional outreach attempts will be made to offer the patient care coordination information and services.   Encounter Outcome:  No Answer   Care Coordination Interventions:  No, not indicated    Kathyrn Sheriff, RN, MSN, BSN, CCM [facility] Care Coordinator [phone]

## 2022-12-03 NOTE — Nursing Note (Signed)
Population Health Management   Social Determinants of Health  Social Work Case Management   In Progress   Enrollment in Progress      PCP: Rhoderick Moody, DO  Population Health Social Worker: Daron Offer, SOCIAL WORKER   Date: 12/03/2022 08:59        Received referral to Social Determinants of Health from [facility]. The patient has been marked as identified for potential enrollment. A future outgoing call will be made to the patient to further discuss Population Health Social Work services and obtain verbal consent if interested.    MSW received referral re: Meals on Wheels. MSW will follow up with patient and update [facility].   Daron Offer, SOCIAL WORKER  12/03/2022, 08:59  Population Health Child psychotherapist

## 2022-12-04 ENCOUNTER — Other Ambulatory Visit (INDEPENDENT_AMBULATORY_CARE_PROVIDER_SITE_OTHER): Payer: Self-pay

## 2022-12-04 ENCOUNTER — Telehealth: Payer: Self-pay | Admitting: Internal Medicine

## 2022-12-04 DIAGNOSIS — Z7189 Other specified counseling: Secondary | ICD-10-CM

## 2022-12-04 NOTE — Telephone Encounter (Signed)
Verbal orders  1 week for 2 weeks 2 week for 4 weeks  Tanner [name] [phone] [facility]

## 2022-12-04 NOTE — Telephone Encounter (Signed)
Spoke with Tanner [name] and gave ok verbals on behalf of the patient

## 2022-12-04 NOTE — Telephone Encounter (Signed)
Fine

## 2022-12-04 NOTE — Nursing Note (Signed)
Social Determinants of Health  Population Health  Initial    Primary Care Provider: Rhoderick Moody, DO  Program Details: Chronic Care Management - CCM  Status: Enrolled  Effective Dates: 12/02/2022 - present  Responsible Staff: Alden Benjamin, RN        MSW received referral from [facility] re: Meals on Wheels. MSW completed chart review, and contacted patient on this date. [facility] sent referral for Meals on Wheels via MyChart. MSW attempted to reach worker at assessment unit at [employer] on Aging, left a voicemail. MSW spoke with patient by phone. Patient confirms that he received a follow up phone call from [facility], and was told that he would receive follow up from [facility] office, and Meals on Wheels will then begin. MSW assessed for other needs at this time. Patient reports that he has no other needs or concerns. MSW informed patient of social worker assistance available if needed. Patient verbalized understanding, and was appreciative. At this time, MSW will not enroll in [facility] program as patient feels his needs are being met. Referral for Meals on Wheels initiated by [facility] and confirmed follow up by [facility] has been made.    A new referral is welcome at anytime.       SDoH Assessment:       Social Determinants of Health     Financial Resource Strain: Low Risk  (12/02/2022)    Financial Resource Strain     SDOH Financial: No   Transportation Needs: Low Risk  (12/02/2022)    Transportation Needs     SDOH Transportation: No   Social Connections: Medium Risk (12/02/2022)    Social Connections     SDOH Social Isolation: 3 to 5 times a week   Intimate Partner Violence: Low Risk  (12/02/2022)    Intimate Partner Violence     SDOH Domestic Violence: No   Housing Stability: Low Risk  (12/02/2022)    Housing Stability     SDOH Housing Situation: I have housing.     SDOH Housing Worry: No   Health Literacy: Low Risk  (12/02/2022)    Health Literacy     SDOH Health Literacy: Never   Employment  Status: Low Risk  (12/02/2022)    Employment Status     SDOH Employment: Otherwise unemployed but not seeking work (ex. Consulting civil engineer, retired, disabled, unpaid primary care giver)       Plan: MSW will not enroll in SDOH program at this time, as patient reports needs are being met. [facility] initiated referral to Meals on wheels. MSW confirmed follow up has been made by [employer] on Aging. A new referral is welcome at any time.     Daron Offer, SOCIAL WORKER

## 2022-12-07 ENCOUNTER — Other Ambulatory Visit (INDEPENDENT_AMBULATORY_CARE_PROVIDER_SITE_OTHER): Payer: Self-pay

## 2022-12-07 ENCOUNTER — Other Ambulatory Visit: Payer: Self-pay | Admitting: Internal Medicine

## 2022-12-07 DIAGNOSIS — Z7189 Other specified counseling: Secondary | ICD-10-CM

## 2022-12-07 NOTE — Anesthesia Preprocedure Evaluation (Addendum)
Anesthesia Evaluation  Patient identified by MRN, date of birth, ID band Patient awake    Reviewed: Allergy & Precautions, NPO status , Patient's Chart, lab work & pertinent test results  Airway Mallampati: III  TM Distance: >3 FB Neck ROM: Full    Dental  (+) Edentulous Lower, Edentulous Upper   Pulmonary sleep apnea , former smoker, PE   Pulmonary exam normal        Cardiovascular hypertension, Pt. on home beta blockers +CHF and + DVT  Normal cardiovascular exam+ dysrhythmias Atrial Fibrillation   ECHO: 1. Left ventricular ejection fraction, by estimation, is 45 to 50%. The  left ventricle has mildly decreased function. The left ventricle  demonstrates global hypokinesis. There is mild concentric left ventricular  hypertrophy. Left ventricular diastolic  parameters are indeterminate.   2. Right ventricular systolic function is normal. The right ventricular  size is normal. Tricuspid regurgitation signal is inadequate for assessing  PA pressure.   3. The mitral valve is normal in structure. No evidence of mitral valve  regurgitation. No evidence of mitral stenosis.   4. The aortic valve is tricuspid. Aortic valve regurgitation is trivial.  No aortic stenosis is present.   5. The inferior vena cava is normal in size with greater than 50%  respiratory variability, suggesting right atrial pressure of 3 mmHg.   6. The patient was in atrial fibrillation.   7. Technically difficult study with poor acoustic windows.     Neuro/Psych  PSYCHIATRIC DISORDERS Anxiety Depression     Neuromuscular disease    GI/Hepatic negative GI ROS, Neg liver ROS,,,  Endo/Other  diabetes    Renal/GU Renal disease     Musculoskeletal  (+) Arthritis ,  Ambulates with cane and walker   Abdominal  (+) + obese  Peds  Hematology  (+) Blood dyscrasia (Eliquis)   Anesthesia Other Findings Right knee osteoarthritis  Reproductive/Obstetrics                              Anesthesia Physical Anesthesia Plan  ASA: 3  Anesthesia Plan: Spinal and Regional   Post-op Pain Management: Regional block*   Induction: Intravenous  PONV Risk Score and Plan: 1 and Ondansetron, Dexamethasone, Propofol infusion and Treatment may vary due to age or medical condition  Airway Management Planned: Simple Face Mask  Additional Equipment:   Intra-op Plan:   Post-operative Plan:   Informed Consent: I have reviewed the patients History and Physical, chart, labs and discussed the procedure including the risks, benefits and alternatives for the proposed anesthesia with the patient or authorized representative who has indicated his/her understanding and acceptance.     Dental advisory given  Plan Discussed with: CRNA  Anesthesia Plan Comments: (PAT note 12/02/2022)       Anesthesia Quick Evaluation

## 2022-12-07 NOTE — Progress Notes (Signed)
Anesthesia Chart Review   Case: [id number] Date/Time: 12/15/22 1405   Procedure: TOTAL KNEE ARTHROPLASTY (Right: Knee)   Anesthesia type: Spinal   Pre-op diagnosis: Right knee osteoarthritis   Location: [facility] 10 / [facility]   Surgeons: Durene Romans, MD       DISCUSSION:77 y.o. former smoker with h/o HTN, DVT, PE, CHF, atrial fibrillation, DM II, CKD Stage III, BPH, right knee OA scheduled for above procedure 12/15/22 with Dr. Durene Romans.   Pt advised to hold Eliquis 2 days prior to procedure by hematology.   Pt last seen by cardiology 12/01/2022. Per OV note, "His DASI exceeds 4 METS which is the minimum requirement.  According to RCRI he is at low risk for any cardiac complication.  We have advised him to hold his Eliquis 3 days prior to his surgery and resume medically safe to do so.  We also have suggested that he get clearance from Dr. Myna Hidalgo. "  Discussed with hematology, ok with 72 hour hold.  Pt reports last dose will be 12/12/22 AM dose.  VS: BP (!) 160/62   Pulse 69   Temp 36.9 C (Oral)   Resp 16   Ht 5' 5.5" (1.664 m)   Wt 106.6 kg   SpO2 94%   BMI 38.51 kg/m   PROVIDERS: Myrlene Broker, MD is PCP   Cardiologist - Carolan Clines, MD LABS: Labs reviewed: Acceptable for surgery. (all labs ordered are listed, but only abnormal results are displayed)  Labs Reviewed  GLUCOSE, CAPILLARY - Abnormal; Notable for the following components:      Result Value   Glucose-Capillary 114 (*)    All other components within normal limits  SURGICAL PCR SCREEN  HEMOGLOBIN A1C     IMAGES:   EKG:   CV: Echo 08/19/2022 1. Left ventricular ejection fraction, by estimation, is 45 to 50%. The  left ventricle has mildly decreased function. The left ventricle  demonstrates global hypokinesis. There is mild concentric left ventricular  hypertrophy. Left ventricular diastolic  parameters are indeterminate.   2. Right ventricular systolic function is normal. The right  ventricular  size is normal. Tricuspid regurgitation signal is inadequate for assessing  PA pressure.   3. The mitral valve is normal in structure. No evidence of mitral valve  regurgitation. No evidence of mitral stenosis.   4. The aortic valve is tricuspid. Aortic valve regurgitation is trivial.  No aortic stenosis is present.   5. The inferior vena cava is normal in size with greater than 50%  respiratory variability, suggesting right atrial pressure of 3 mmHg.   6. The patient was in atrial fibrillation.   7. Technically difficult study with poor acoustic windows.  Past Medical History:  Diagnosis Date   Acute kidney failure (HCC)    Anxiety    Arthritis    Back pain    BPH (benign prostatic hypertrophy)    CHF (congestive heart failure) (HCC)    Clostridium difficile infection    Clotting disorder (HCC)    Depression    Diabetes (HCC) 12/11/2016   type 2    DVT (deep venous thrombosis) (HCC)    DVT of deep femoral vein, right (HCC) 06/29/2018   Edema, lower extremity    High cholesterol    Hypertension    Joint pain    Low back pain potentially associated with spinal stenosis    Neuromuscular disorder (HCC)    Neuropathy of lower extremity    bilateral   OSA (obstructive  sleep apnea)    pt denies    Osteoarthritis    PE (pulmonary embolism)    Pneumonia    hx of x 2    Ventral hernia     Past Surgical History:  Procedure Laterality Date   CARDIOVERSION N/A 08/19/2022   Procedure: CARDIOVERSION;  Surgeon: Meriam Sprague, MD;  Location: [facility] ENDOSCOPY;  Service: Cardiovascular;  Laterality: N/A;   EYE SURGERY     HERNIA REPAIR  1999   TOTAL KNEE ARTHROPLASTY Left 08/26/2020   Procedure: LEFT TOTAL KNEE ARTHROPLASTY;  Surgeon: Gean Birchwood, MD;  Location: [facility];  Service: Orthopedics;  Laterality: Left;    MEDICATIONS:  acetaminophen (TYLENOL) 500 MG tablet   acidophilus (RISAQUAD) CAPS capsule   amiodarone (PACERONE) 200 MG tablet   apixaban (ELIQUIS) 5  MG TABS tablet   atorvastatin (LIPITOR) 80 MG tablet   buPROPion (WELLBUTRIN SR) 150 MG 12 hr tablet   carvedilol (COREG) 6.25 MG tablet   clobetasol ointment (TEMOVATE) 0.05 %   empagliflozin (JARDIANCE) 10 MG TABS tablet   finasteride (PROSCAR) 5 MG tablet   gabapentin (NEURONTIN) 300 MG capsule   lidocaine (XYLOCAINE) 2 % solution   Menthol-Methyl Salicylate (THERA-GESIC PLUS) CREA   Multiple Vitamins-Minerals (MULTIVITAMINS THER. W/MINERALS) TABS   nortriptyline (PAMELOR) 10 MG capsule   saccharomyces boulardii (FLORASTOR) 250 MG capsule   Semaglutide, 1 MG/DOSE, 4 MG/3ML SOPN   Semaglutide, 2 MG/DOSE, 8 MG/3ML SOPN   sildenafil (VIAGRA) 100 MG tablet   torsemide (DEMADEX) 20 MG tablet   No current facility-administered medications for this encounter.     Jodell Cipro Ward, PA-C [facility] [phone]

## 2022-12-07 NOTE — Nursing Note (Signed)
Population Health - Patient Outreach    Patient Navigator received a referral from Darlys Gales [facility] regarding assistance with Extra Help application. Full chart review completed prior to contact. Patient Navigator contacted patient via preferred telephone number to assist patient with filling out application for Extra Help through Medicare.     Patient Navigator spoke to patient about filling out the Extra Help application. Patient stated he is already getting his Eliquis at no cost and the other medications are only $4.50 a month so he did not see a reason to fill it out at this time. PN explained if anything changes to let his case manager know and we can fill out the application at another time. Patient understood.    Edd Arbour, Patient Navigator 12/07/2022 15:20  Population Health, [facility]  P: [phone]

## 2022-12-08 ENCOUNTER — Other Ambulatory Visit (INDEPENDENT_AMBULATORY_CARE_PROVIDER_SITE_OTHER): Payer: Self-pay | Admitting: Physician Assistant

## 2022-12-08 ENCOUNTER — Other Ambulatory Visit (INDEPENDENT_AMBULATORY_CARE_PROVIDER_SITE_OTHER): Payer: Self-pay

## 2022-12-08 DIAGNOSIS — Z7189 Other specified counseling: Secondary | ICD-10-CM

## 2022-12-08 NOTE — Telephone Encounter (Signed)
Regarding: Dr Thomas [name], refill medication  ----- Message from Rinaldo Ratel sent at 12/08/2022  9:24 AM EDT -----  Thomas Moody, DO    Patient called for refill:     Disp Refills Start End   apixaban (ELIQUIS) 5 mg Oral Tablet 180 Tablet 3 12/02/2022 -   Sig - Route: Take 1 Tablet (5 mg total) by mouth Twice daily SHIP TO PATIENT,  [id number]  STATE LICENSE- [id number] L  NPI- [id number] - Oral   Class: Print   Non-formulary Exception Code: RXHUB/No Formulary Info Available     Preferred Pharmacy     [facility] - [place], [place] - [address]    [address] [place] [address]    Phone: [phone] Fax: [phone]    Hours: Not open 24 hours      Thank you,  Rinaldo Ratel

## 2022-12-08 NOTE — Telephone Encounter (Signed)
Transmission failed, pharmacy did not received, pt aware that we will try to resend

## 2022-12-08 NOTE — Nursing Note (Signed)
POPULATION HEALTH    DISEASE MANAGEMENT COORDINATOR    Patient reports that [facility] pharmacy did not receive script from Dr. Gordan Payment sent 6/19. [facility] offered to call and confirm. [facility] pulled order up in Epic sent over by DR. Gordan Payment and Pharmacist. [facility] contacted pharmacy and  verbally reordered medication. They did not receive medication order on 6/19 even though is is complete on EPIC. Patient made aware script was called in and will be mailed.   Alden Benjamin, RN, BSN  Disease Management Coordinator  [employer]  [phone]

## 2022-12-09 ENCOUNTER — Other Ambulatory Visit (INDEPENDENT_AMBULATORY_CARE_PROVIDER_SITE_OTHER): Payer: Self-pay

## 2022-12-09 MED ORDER — FINASTERIDE 5 MG TABLET
5.0000 mg | ORAL_TABLET | Freq: Every day | ORAL | 2 refills | Status: DC
Start: 2022-12-09 — End: 2023-03-08

## 2022-12-10 ENCOUNTER — Telehealth: Payer: Self-pay | Admitting: *Deleted

## 2022-12-10 NOTE — Progress Notes (Signed)
  Care Coordination Note  12/10/2022 Name: Tanner [name] MRN: [id number] DOB: June 01, 1946  Tanner [name] is a 77 y.o. year old male who is a primary care patient of Myrlene Broker, MD and is actively engaged with the care management team. I reached out to [name] by phone today to assist with re-scheduling a follow up visit with the RN Case Manager  Follow up plan: Unsuccessful telephone outreach attempt made. A HIPAA compliant phone message was left for the patient providing contact information and requesting a return call.   Burman Nieves, CCMA Care Coordination Care Guide Direct Dial: [phone]

## 2022-12-10 NOTE — Progress Notes (Unsigned)
[facility] MEDICINE  [place] [facility] - FOLLOWUP    PATIENT NAME :Thomas [name]   DATE OF SERVICE: 12/11/2022  SERVICE LOCATION: [facility]      Thomas [name] is a 77 y.o. male who presents for followup of weakened stream and hesitancy with hematospermia.    He has been on oral medications for some time, and has had minimal improvement with additional Proscar.  He continues on Proscar and Uroxatral.  He denies any new changes in his urinary symptoms, in his here to review results from his recent transrectal ultrasound.  He has been able to review some literature regarding minimally invasive therapies, and is leaning towards UroLift.    From review of prior imaging, he is undergone CT scan of the abdomen pelvis in 2020, showing a 6.6 x 5.3 x 4.2cm = 76.7g prostate.    AUASS  10/2022 - 14 / 5 on max medical therapy.      Past Medical History  Past Medical History:   Diagnosis Date    Arthritis     Atrial flutter (CMS HCC)     Cancer (CMS HCC)     Dysuria     Enteritis due to Rotavirus     Gout     Headache     Hemorrhoid     Hypercholesterolemia     Hypertension            Past Surgical History  Past Surgical History:   Procedure Laterality Date    HX HEMORRHOIDECTOMY             Allergies  No Known Allergies      Medications  acetaminophen (TYLENOL) 500 mg Oral Tablet, Take 1 Tablet (500 mg total) by mouth Every 4 hours as needed for Pain (pt takes tylenol pm)  alfuzosin (UROXATRAL) 10 mg Oral Tablet Sustained Release 24 hr, TAKE ONE TABLET BY MOUTH EVERY DAY  apixaban (ELIQUIS) 5 mg Oral Tablet, Take 1 Tablet (5 mg total) by mouth Twice daily SHIP TO PATIENT,   [id number]  STATE LICENSE- [id number] L  NPI- [id number]  diltiazem HCl (TIAZAC) 240 mg Oral Capsule,Sustained Action 24 hr, TAKE ONE CAPSULE BY MOUTH DAILY  finasteride (PROSCAR) 5 mg Oral Tablet, Take 1 Tablet (5 mg total) by mouth Once a day  flecainide (TAMBOCOR) 100 mg Oral Tablet, TAKE ONE TABLET BY MOUTH TWICE  DAILY  furosemide (LASIX) 40 mg Oral Tablet, TAKE ONE TABLET BY MOUTH EVERY DAY  losartan (COZAAR) 50 mg Oral Tablet, Take 1 Tablet (50 mg total) by mouth Once a day  nitroGLYCERIN (NITROSTAT) 0.4 mg Sublingual Tablet, Sublingual, Place 1 Tablet (0.4 mg total) under the tongue Every 5 minutes as needed for Chest pain  potassium chloride (KLOR-CON) 10 mEq Oral Tablet Sustained Release, TAKE ONE TABLET BY MOUTH EVERY DAY  rosuvastatin (CRESTOR) 10 mg Oral Tablet, Take 1 Tablet (10 mg total) by mouth Every evening    No facility-administered medications prior to visit.      OBJECTIVE:  Resp 16   Ht 1.803 m (5\' 11" )   Wt 109 kg (240 lb)   BMI 33.47 kg/m       General: No apparent distress, well appearing  HEENT:  NCAT, EOMI, OP clear  Neck:  supple, trachea midline  RESP:  Nonlabored breathing, no use of accessory muscles   CV: No extremity swelling  Neuro:  Neurological exam is consistent with patients age.     GU:  Phallus within normal limits, orthotopic  meatus.  Scrotal skin and contents within normal limits  DRE: completed last office visit  Psych: alert, appropriate mood, and in no acute distress.      MSK: Ambulates without assistance, bilateral compression stockings in place      PSA:  05/2016 - 1.30  06/2017 - 2.60  04/2021 - 2.22  04/2022 - 1.66    TRUS Suzing  11/2022  1. Prostate gland heterogeneous echogenicity, no solid mass; a few tiny nonaggressive cysts.   2. Prostate gland not enlarged, 42.7 cm3 (25.0-55.0 cm3).        ASSESSMENT:   Encounter Diagnoses   Name Primary?    Enlarged prostate with lower urinary tract symptoms (LUTS) Yes               PLAN: 77 year old male with history of BPH w/ LUTS and hematospermia  - we discussed his AUA symptom score, and considerable hypertrophy of the prostate seen on cystoscopy, and findings of prostate less than 50 g.  We discussed continued path of observation, alternative alpha blockers or 5 alpha reductase inhibitors, PDE5 inhibitors, and surgical options.  We discussed minimally invasive therapies, particularly UroLift and Rezum, and goals for improvement in his quality of life.  We discussed ejaculatory changes, erectile dysfunction, and he was not currently bothered by these, as he remains a widower.  - I will see him back in 6 months with a symptom check, or earlier if he decides to proceed with intervention.  - 20 minutes was spent in review of his recent prostate ultrasound results.  We reviewed these images in the office, as well as has a additional discussion regarding bladder outlet management options.    No orders of the defined types were placed in this encounter.    Nolon Nations, MD   [facility]

## 2022-12-11 ENCOUNTER — Other Ambulatory Visit: Payer: Self-pay

## 2022-12-11 ENCOUNTER — Encounter (INDEPENDENT_AMBULATORY_CARE_PROVIDER_SITE_OTHER): Payer: Self-pay | Admitting: Urology

## 2022-12-11 ENCOUNTER — Other Ambulatory Visit (INDEPENDENT_AMBULATORY_CARE_PROVIDER_SITE_OTHER): Payer: Commercial Managed Care - PPO

## 2022-12-11 ENCOUNTER — Ambulatory Visit: Payer: Commercial Managed Care - PPO | Attending: Urology | Admitting: Urology

## 2022-12-11 VITALS — Resp 16 | Ht 71.0 in | Wt 240.0 lb

## 2022-12-11 DIAGNOSIS — E782 Mixed hyperlipidemia: Secondary | ICD-10-CM

## 2022-12-11 DIAGNOSIS — I10 Essential (primary) hypertension: Secondary | ICD-10-CM

## 2022-12-11 DIAGNOSIS — Z7189 Other specified counseling: Secondary | ICD-10-CM

## 2022-12-11 DIAGNOSIS — N401 Enlarged prostate with lower urinary tract symptoms: Secondary | ICD-10-CM

## 2022-12-11 NOTE — Progress Notes (Signed)
Dr. Rhoderick Moody, DO      This patient has met the requirements to bill for COMPLEX Chronic Care Management this month.       Please add .[email or web address] to this note for attestation and billing.      Chronic Care Management Time Documentation on 12/11/2022 10:34.   Time spent during current encounter is 1 minutes.   Cumulative time during current month's episode (month-to-date) is 120 minutes.          ICD-10-CM    1. Encounter for counseling for care management of patient with chronic conditions and complex health needs using nurse-based model  Z71.89       2. Essential hypertension  I10       3. Mixed hyperlipidemia  E78.2             acetaminophen (TYLENOL) 500 mg Oral Tablet, Take 1 Tablet (500 mg total) by mouth Every 4 hours as needed for Pain (pt takes tylenol pm)  alfuzosin (UROXATRAL) 10 mg Oral Tablet Sustained Release 24 hr, TAKE ONE TABLET BY MOUTH EVERY DAY  apixaban (ELIQUIS) 5 mg Oral Tablet, Take 1 Tablet (5 mg total) by mouth Twice daily SHIP TO PATIENT,   [id number]  STATE LICENSE- [id number] L  NPI- [id number]  diltiazem HCl (TIAZAC) 240 mg Oral Capsule,Sustained Action 24 hr, TAKE ONE CAPSULE BY MOUTH DAILY  finasteride (PROSCAR) 5 mg Oral Tablet, Take 1 Tablet (5 mg total) by mouth Once a day  flecainide (TAMBOCOR) 100 mg Oral Tablet, TAKE ONE TABLET BY MOUTH TWICE DAILY  furosemide (LASIX) 40 mg Oral Tablet, TAKE ONE TABLET BY MOUTH EVERY DAY  losartan (COZAAR) 50 mg Oral Tablet, Take 1 Tablet (50 mg total) by mouth Once a day  nitroGLYCERIN (NITROSTAT) 0.4 mg Sublingual Tablet, Sublingual, Place 1 Tablet (0.4 mg total) under the tongue Every 5 minutes as needed for Chest pain  potassium chloride (KLOR-CON) 10 mEq Oral Tablet Sustained Release, TAKE ONE TABLET BY MOUTH EVERY DAY  rosuvastatin (CRESTOR) 10 mg Oral Tablet, Take 1 Tablet (10 mg total) by mouth Every evening    No facility-administered medications prior to visit.      Thanks,  Your Chronic Care Management Team

## 2022-12-14 NOTE — H&P (Signed)
TOTAL KNEE ADMISSION H&P  Patient is being admitted for right total knee arthroplasty.  Therapy Plans: outpatient therapy at [facility] Disposition: Home with sons Planned DVT Prophylaxis: Eliquis 5 mg BID (hx of DVT) DME needed: none PCP: Dr. Okey Dupre, clearance received Cardio: Dr. Wyline Mood Heme: Dr. Myna Hidalgo - clearance received TXA: IV Allergies: codeine - itching, lisinopril - unknown Anesthesia Concerns: none BMI: 39 Last HgbA1c: 4.8%   Other: - Hx of left TKA in 2021 by Dr. Turner Daniels - did well - oxycodone, robaxin, tylenol   Subjective:  Chief Complaint:right knee pain.  HPI: Tanner [name], 77 y.o. male, has a history of pain and functional disability in the right knee due to arthritis and has failed non-surgical conservative treatments for greater than 12 weeks to includeNSAID's and/or analgesics, corticosteriod injections, and activity modification.  Onset of symptoms was gradual, starting 2 years ago with gradually worsening course since that time. The patient noted no past surgery on the right knee(s).  Patient currently rates pain in the right knee(s) at 8 out of 10 with activity. Patient has worsening of pain with activity and weight bearing, pain that interferes with activities of daily living, and pain with passive range of motion.  Patient has evidence of joint space narrowing by imaging studies. There is no active infection.  Patient Active Problem List   Diagnosis Date Noted   Acute respiratory failure with hypoxemia (HCC) 10/22/2022   Class 2 obesity 08/19/2022   Paroxysmal atrial fibrillation (HCC) 08/19/2022   Jaw pain 06/24/2022   Left hand pain 05/29/2022   Chronic systolic heart failure (HCC) 02/23/2022   Moderate mitral regurgitation 02/23/2022   Leg swelling 01/29/2022   ED (erectile dysfunction) 01/13/2021   Cyst, dermoid, scalp and neck 01/13/2021   Chronic kidney disease (CKD), stage III (moderate) (HCC) 12/13/2019   Carpal tunnel syndrome  12/05/2019   Elevated creatine kinase 11/15/2019   Aortic atherosclerosis (HCC) 11/15/2019   Recurrent Clostridium difficile diarrhea 05/28/2017   Peripheral neuropathy 03/17/2017   Diabetes (HCC) 12/11/2016   Allergic rhinitis 12/11/2016   Degenerative arthritis of left knee 01/30/2016   Morbid obesity (HCC) 01/30/2016   History of pulmonary embolus (PE) 12/19/2013   Anxiety state 03/29/2007   Depression 03/29/2007   OSA (obstructive sleep apnea) 03/29/2007   Unspecified glaucoma 03/29/2007   BPH (benign prostatic hyperplasia) 03/29/2007   OA (osteoarthritis) of knee 03/29/2007   Essential hypertension 03/29/2007   History of Bell's palsy 03/29/2007   Past Medical History:  Diagnosis Date   Acute kidney failure (HCC)    Anxiety    Arthritis    Back pain    BPH (benign prostatic hypertrophy)    CHF (congestive heart failure) (HCC)    Clostridium difficile infection    Clotting disorder (HCC)    Depression    Diabetes (HCC) 12/11/2016   type 2    DVT (deep venous thrombosis) (HCC)    DVT of deep femoral vein, right (HCC) 06/29/2018   Edema, lower extremity    High cholesterol    Hypertension    Joint pain    Low back pain potentially associated with spinal stenosis    Neuromuscular disorder (HCC)    Neuropathy of lower extremity    bilateral   OSA (obstructive sleep apnea)    pt denies    Osteoarthritis    PE (pulmonary embolism)    Pneumonia    hx of x 2    Ventral hernia     Past Surgical  History:  Procedure Laterality Date   CARDIOVERSION N/A 08/19/2022   Procedure: CARDIOVERSION;  Surgeon: Meriam Sprague, MD;  Location: [facility] ENDOSCOPY;  Service: Cardiovascular;  Laterality: N/A;   EYE SURGERY     HERNIA REPAIR  1999   TOTAL KNEE ARTHROPLASTY Left 08/26/2020   Procedure: LEFT TOTAL KNEE ARTHROPLASTY;  Surgeon: Gean Birchwood, MD;  Location: [facility] ORS;  Service: Orthopedics;  Laterality: Left;    No current facility-administered medications for this encounter.    Current Outpatient Medications  Medication Sig Dispense Refill Last Dose   acetaminophen (TYLENOL) 500 MG tablet Take 1,000 mg by mouth every 6 (six) hours as needed for moderate pain.      acidophilus (RISAQUAD) CAPS capsule Take 1 capsule by mouth daily.      amiodarone (PACERONE) 200 MG tablet Take 1 tablet (200 mg total) by mouth daily. 90 tablet 3    apixaban (ELIQUIS) 5 MG TABS tablet Take 5 mg by mouth 2 (two) times daily.      atorvastatin (LIPITOR) 80 MG tablet Take 1 tablet (80 mg total) by mouth daily. 30 tablet 5    buPROPion (WELLBUTRIN SR) 150 MG 12 hr tablet Take 150 mg by mouth 2 (two) times daily.      carvedilol (COREG) 6.25 MG tablet Take 1 tablet (6.25 mg total) by mouth 2 (two) times daily with a meal. 180 tablet 1    clobetasol ointment (TEMOVATE) 0.05 % Apply 1 Application topically daily as needed (irritation).      empagliflozin (JARDIANCE) 10 MG TABS tablet Take 1 tablet (10 mg total) by mouth daily. 90 tablet 3    finasteride (PROSCAR) 5 MG tablet Take 5 mg by mouth at bedtime.      gabapentin (NEURONTIN) 300 MG capsule Take 300-600 mg by mouth See admin instructions. Take 600 mg by mouth in the morning, afternoon and evening and take 300 mg at night      Menthol-Methyl Salicylate (THERA-GESIC PLUS) CREA Apply 1 application topically at bedtime. Apply to lower back      Multiple Vitamins-Minerals (MULTIVITAMINS THER. W/MINERALS) TABS Take 1 tablet by mouth daily.      nortriptyline (PAMELOR) 10 MG capsule 2 CAPSULES EVERY NIGHT AT BEDTIME 180 capsule 0    Semaglutide, 1 MG/DOSE, 4 MG/3ML SOPN Inject 1 mg as directed once a week. 3 mL 0    sildenafil (VIAGRA) 100 MG tablet Take 100 mg by mouth as needed for erectile dysfunction.      torsemide (DEMADEX) 20 MG tablet Take 1 tablet (20 mg total) by mouth daily. 90 tablet 1    lidocaine (XYLOCAINE) 2 % solution Use as directed 15 mLs in the mouth or throat as needed for mouth pain. 100 mL 0 Not Taking   saccharomyces  boulardii (FLORASTOR) 250 MG capsule Take 1 capsule (250 mg total) by mouth 2 (two) times daily. 30 capsule 0 Not Taking   Semaglutide, 2 MG/DOSE, 8 MG/3ML SOPN Inject 2 mg as directed once a week. 3 mL 11    Allergies  Allergen Reactions   Lisinopril Other (See Comments)     Renal impairment   Amlodipine Swelling   Codeine Sulfate Itching and Nausea Only    Social History   Tobacco Use   Smoking status: Former    Packs/day: 2.00    Years: 33.00    Additional pack years: 0.00    Total pack years: 66.00    Types: Cigarettes    Start date: 08/30/1968  Quit date: 07/02/2001    Years since quitting: 21.4   Smokeless tobacco: Never   Tobacco comments:    quit 12 years ago  Substance Use Topics   Alcohol use: Yes    Comment: seldom     Family History  Problem Relation Age of Onset   Diabetes Mother    Pneumonia Mother    Alzheimer's disease Mother    Hyperlipidemia Mother    Thyroid disease Mother    Depression Mother    Other Father 43       Drowned on boating accident   Colon cancer Neg Hx    Colon polyps Neg Hx      Review of Systems  Constitutional:  Negative for chills and fever.  Respiratory:  Negative for cough and shortness of breath.   Cardiovascular:  Negative for chest pain.  Gastrointestinal:  Negative for nausea and vomiting.  Musculoskeletal:  Positive for arthralgias.     Objective:  Physical Exam Well nourished and well developed. General: Alert and oriented x3, cooperative and pleasant, no acute distress. Head: normocephalic, atraumatic, neck supple. Eyes: EOMI.  Musculoskeletal: Right knee exam: Slight valgus right knee with pain over the lateral side of the knee Slight flexion contracture with flexion to 110 degrees with tightness over the anterior lateral aspect knee Stable medial and lateral collateral ligaments with a passively correctable valgus  Left knee exam: Well-healed surgical incision without signs of infection Less than 5  degree flexion contracture with flexion to 110 degrees with tightness No significant lower extremity edema, erythema or calf tenderness at this point despite history of DVT in the left lower extremity   Calves soft and nontender. Motor function intact in LE. Strength 5/5 LE bilaterally. Neuro: Distal pulses 2+. Sensation to light touch intact in LE.  Vital signs in last 24 hours:    Labs:   Estimated body mass index is 38.51 kg/m as calculated from the following:   Height as of 12/02/22: 5' 5.5" (1.664 m).   Weight as of 12/02/22: 106.6 kg.   Imaging Review Plain radiographs demonstrate severe degenerative joint disease of the right knee(s). The overall alignment isneutral. The bone quality appears to be adequate for age and reported activity level.      Assessment/Plan:  End stage arthritis, right knee   The patient history, physical examination, clinical judgment of the provider and imaging studies are consistent with end stage degenerative joint disease of the right knee(s) and total knee arthroplasty is deemed medically necessary. The treatment options including medical management, injection therapy arthroscopy and arthroplasty were discussed at length. The risks and benefits of total knee arthroplasty were presented and reviewed. The risks due to aseptic loosening, infection, stiffness, patella tracking problems, thromboembolic complications and other imponderables were discussed. The patient acknowledged the explanation, agreed to proceed with the plan and consent was signed. Patient is being admitted for inpatient treatment for surgery, pain control, PT, OT, prophylactic antibiotics, VTE prophylaxis, progressive ambulation and ADL's and discharge planning. The patient is planning to be discharged  home.     Patient's anticipated LOS is less than 2 midnights, meeting these requirements: - Younger than 31 - Lives within 1 hour of care - Has a competent adult at home to  recover with post-op recover - NO history of  - Chronic pain requiring opiods  - Diabetes  - Coronary Artery Disease  - Heart failure  - Heart attack  - Stroke  - DVT/VTE  - Cardiac arrhythmia  -  Respiratory Failure/COPD  - Renal failure  - Anemia  - Advanced Liver disease  Rosalene Billings, PA-C Orthopedic Surgery [facility] [place] [phone]

## 2022-12-15 ENCOUNTER — Ambulatory Visit (HOSPITAL_COMMUNITY): Payer: No Typology Code available for payment source | Admitting: Physician Assistant

## 2022-12-15 ENCOUNTER — Encounter (HOSPITAL_COMMUNITY)
Admission: RE | Disposition: A | Discharge status: Home / Self Care | Payer: Self-pay | Source: Ambulatory Visit | Attending: Orthopedic Surgery

## 2022-12-15 ENCOUNTER — Observation Stay (HOSPITAL_COMMUNITY)
Admission: RE | Admit: 2022-12-15 | Discharge: 2022-12-16 | Disposition: A | Discharge status: Home / Self Care | Payer: No Typology Code available for payment source | Source: Ambulatory Visit | Attending: Orthopedic Surgery | Admitting: Orthopedic Surgery

## 2022-12-15 ENCOUNTER — Other Ambulatory Visit (INDEPENDENT_AMBULATORY_CARE_PROVIDER_SITE_OTHER): Payer: Self-pay | Admitting: Physician Assistant

## 2022-12-15 ENCOUNTER — Other Ambulatory Visit (INDEPENDENT_AMBULATORY_CARE_PROVIDER_SITE_OTHER): Payer: Self-pay | Admitting: Family Medicine

## 2022-12-15 ENCOUNTER — Ambulatory Visit (HOSPITAL_COMMUNITY): Payer: No Typology Code available for payment source | Admitting: Registered Nurse

## 2022-12-15 ENCOUNTER — Other Ambulatory Visit: Payer: Self-pay

## 2022-12-15 ENCOUNTER — Encounter (HOSPITAL_COMMUNITY): Payer: Self-pay | Admitting: Orthopedic Surgery

## 2022-12-15 DIAGNOSIS — Z86718 Personal history of other venous thrombosis and embolism: Secondary | ICD-10-CM | POA: Diagnosis not present

## 2022-12-15 DIAGNOSIS — I480 Paroxysmal atrial fibrillation: Secondary | ICD-10-CM | POA: Insufficient documentation

## 2022-12-15 DIAGNOSIS — Z7901 Long term (current) use of anticoagulants: Secondary | ICD-10-CM | POA: Diagnosis not present

## 2022-12-15 DIAGNOSIS — I5022 Chronic systolic (congestive) heart failure: Secondary | ICD-10-CM | POA: Insufficient documentation

## 2022-12-15 DIAGNOSIS — Z96652 Presence of left artificial knee joint: Secondary | ICD-10-CM | POA: Insufficient documentation

## 2022-12-15 DIAGNOSIS — N183 Chronic kidney disease, stage 3 unspecified: Secondary | ICD-10-CM | POA: Insufficient documentation

## 2022-12-15 DIAGNOSIS — M1711 Unilateral primary osteoarthritis, right knee: Secondary | ICD-10-CM | POA: Diagnosis not present

## 2022-12-15 DIAGNOSIS — E119 Type 2 diabetes mellitus without complications: Secondary | ICD-10-CM

## 2022-12-15 DIAGNOSIS — G8918 Other acute postprocedural pain: Secondary | ICD-10-CM | POA: Diagnosis not present

## 2022-12-15 DIAGNOSIS — E1122 Type 2 diabetes mellitus with diabetic chronic kidney disease: Secondary | ICD-10-CM | POA: Diagnosis not present

## 2022-12-15 DIAGNOSIS — I130 Hypertensive heart and chronic kidney disease with heart failure and stage 1 through stage 4 chronic kidney disease, or unspecified chronic kidney disease: Secondary | ICD-10-CM | POA: Diagnosis not present

## 2022-12-15 DIAGNOSIS — Z96651 Presence of right artificial knee joint: Secondary | ICD-10-CM

## 2022-12-15 DIAGNOSIS — Z86711 Personal history of pulmonary embolism: Secondary | ICD-10-CM | POA: Insufficient documentation

## 2022-12-15 DIAGNOSIS — Z79899 Other long term (current) drug therapy: Secondary | ICD-10-CM | POA: Insufficient documentation

## 2022-12-15 DIAGNOSIS — Z87891 Personal history of nicotine dependence: Secondary | ICD-10-CM

## 2022-12-15 HISTORY — PX: TOTAL KNEE ARTHROPLASTY: SHX125

## 2022-12-15 LAB — GLUCOSE, CAPILLARY
Glucose-Capillary: 85 mg/dL (ref 70–99)
Glucose-Capillary: 76 mg/dL (ref 70–99)
Glucose-Capillary: 79 mg/dL (ref 70–99)
Glucose-Capillary: 146 mg/dL — ABNORMAL HIGH (ref 70–99)

## 2022-12-15 SURGERY — ARTHROPLASTY, KNEE, TOTAL
Anesthesia: Regional | Site: Knee | Laterality: Right

## 2022-12-15 MED ORDER — ONDANSETRON HCL 4 MG/2ML IJ SOLN: 4.0000 mg | Freq: Four times a day (QID) | INTRAMUSCULAR | Status: DC | PRN

## 2022-12-15 MED ORDER — INSULIN ASPART 100 UNIT/ML IJ SOLN
0.0000 [IU] | Freq: Three times a day (TID) | INTRAMUSCULAR | Status: DC
  Administered 2022-12-16 (×2): 2 [IU] via SUBCUTANEOUS

## 2022-12-15 MED ORDER — ACETAMINOPHEN 10 MG/ML IV SOLN: 1000.0000 mg | Freq: Once | INTRAVENOUS | Status: DC | PRN

## 2022-12-15 MED ORDER — OXYCODONE HCL 5 MG PO TABS: 10.0000 mg | ORAL_TABLET | ORAL | Status: DC | PRN

## 2022-12-15 MED ORDER — PHENYLEPHRINE HCL-NACL 20-0.9 MG/250ML-% IV SOLN
INTRAVENOUS | Status: DC | PRN
  Administered 2022-12-15: 25 ug/min via INTRAVENOUS

## 2022-12-15 MED ORDER — PHENOL 1.4 % MT LIQD: 1.0000 | OROMUCOSAL | Status: DC | PRN

## 2022-12-15 MED ORDER — CEFAZOLIN SODIUM-DEXTROSE 2-4 GM/100ML-% IV SOLN
2.0000 g | Freq: Four times a day (QID) | INTRAVENOUS | Status: AC
  Administered 2022-12-15 – 2022-12-16 (×2): 2 g via INTRAVENOUS
  Filled 2022-12-15 (×2): qty 100

## 2022-12-15 MED ORDER — ONDANSETRON HCL 4 MG/2ML IJ SOLN
INTRAMUSCULAR | Status: DC | PRN
  Administered 2022-12-15: 4 mg via INTRAVENOUS

## 2022-12-15 MED ORDER — TRANEXAMIC ACID-NACL 1000-0.7 MG/100ML-% IV SOLN
1000.0000 mg | Freq: Once | INTRAVENOUS | Status: AC
  Administered 2022-12-15: 1000 mg via INTRAVENOUS
  Filled 2022-12-15: qty 100

## 2022-12-15 MED ORDER — POLYETHYLENE GLYCOL 3350 17 G PO PACK
17.0000 g | PACK | Freq: Two times a day (BID) | ORAL | Status: DC
  Administered 2022-12-16: 17 g via ORAL
  Filled 2022-12-15: qty 1

## 2022-12-15 MED ORDER — GABAPENTIN 300 MG PO CAPS
300.0000 mg | ORAL_CAPSULE | Freq: Every day | ORAL | Status: DC
  Administered 2022-12-15: 300 mg via ORAL
  Filled 2022-12-15: qty 1

## 2022-12-15 MED ORDER — DEXAMETHASONE SODIUM PHOSPHATE 10 MG/ML IJ SOLN
8.0000 mg | Freq: Once | INTRAMUSCULAR | Status: AC
  Administered 2022-12-15: 6 mg via INTRAVENOUS

## 2022-12-15 MED ORDER — DIPHENHYDRAMINE HCL 12.5 MG/5ML PO ELIX: 12.5000 mg | ORAL_SOLUTION | ORAL | Status: DC | PRN

## 2022-12-15 MED ORDER — ONDANSETRON HCL 4 MG/2ML IJ SOLN
INTRAMUSCULAR | Status: AC
  Filled 2022-12-15: qty 2

## 2022-12-15 MED ORDER — HYDROMORPHONE HCL 1 MG/ML IJ SOLN: 0.5000 mg | INTRAMUSCULAR | Status: DC | PRN

## 2022-12-15 MED ORDER — CEFAZOLIN SODIUM-DEXTROSE 2-4 GM/100ML-% IV SOLN
2.0000 g | INTRAVENOUS | Status: AC
  Administered 2022-12-15: 2 g via INTRAVENOUS
  Filled 2022-12-15: qty 100

## 2022-12-15 MED ORDER — MIDAZOLAM HCL 2 MG/2ML IJ SOLN
INTRAMUSCULAR | Status: AC
  Filled 2022-12-15: qty 2

## 2022-12-15 MED ORDER — FINASTERIDE 5 MG PO TABS
5.0000 mg | ORAL_TABLET | Freq: Every day | ORAL | Status: DC
  Administered 2022-12-15: 5 mg via ORAL
  Filled 2022-12-15: qty 1

## 2022-12-15 MED ORDER — SODIUM CHLORIDE (PF) 0.9 % IJ SOLN
INTRAMUSCULAR | Status: AC
  Filled 2022-12-15: qty 30

## 2022-12-15 MED ORDER — ROPIVACAINE HCL 5 MG/ML IJ SOLN
INTRAMUSCULAR | Status: DC | PRN
  Administered 2022-12-15: 30 mL via PERINEURAL

## 2022-12-15 MED ORDER — FENTANYL CITRATE (PF) 100 MCG/2ML IJ SOLN
INTRAMUSCULAR | Status: DC | PRN
  Administered 2022-12-15: 100 ug via INTRAVENOUS

## 2022-12-15 MED ORDER — EPINEPHRINE PF 1 MG/ML IJ SOLN
INTRAMUSCULAR | Status: AC
  Filled 2022-12-15: qty 1

## 2022-12-15 MED ORDER — FENTANYL CITRATE PF 50 MCG/ML IJ SOSY
PREFILLED_SYRINGE | INTRAMUSCULAR | Status: AC
  Filled 2022-12-15: qty 1

## 2022-12-15 MED ORDER — 0.9 % SODIUM CHLORIDE (POUR BTL) OPTIME
TOPICAL | Status: DC | PRN
  Administered 2022-12-15: 1000 mL

## 2022-12-15 MED ORDER — BISACODYL 10 MG RE SUPP: 10.0000 mg | Freq: Every day | RECTAL | Status: DC | PRN

## 2022-12-15 MED ORDER — FENTANYL CITRATE PF 50 MCG/ML IJ SOSY
25.0000 ug | PREFILLED_SYRINGE | INTRAMUSCULAR | Status: DC | PRN
  Administered 2022-12-15 (×2): 50 ug via INTRAVENOUS

## 2022-12-15 MED ORDER — NORTRIPTYLINE HCL 10 MG PO CAPS
20.0000 mg | ORAL_CAPSULE | Freq: Every day | ORAL | Status: DC
  Filled 2022-12-15: qty 2

## 2022-12-15 MED ORDER — METHOCARBAMOL 500 MG IVPB - SIMPLE MED: 500.0000 mg | Freq: Four times a day (QID) | INTRAVENOUS | Status: DC | PRN

## 2022-12-15 MED ORDER — EMPAGLIFLOZIN 10 MG PO TABS
10.0000 mg | ORAL_TABLET | Freq: Every day | ORAL | Status: DC
  Administered 2022-12-16: 10 mg via ORAL
  Filled 2022-12-15: qty 1

## 2022-12-15 MED ORDER — DEXAMETHASONE SODIUM PHOSPHATE 10 MG/ML IJ SOLN
INTRAMUSCULAR | Status: AC
  Filled 2022-12-15: qty 1

## 2022-12-15 MED ORDER — TORSEMIDE 20 MG PO TABS
20.0000 mg | ORAL_TABLET | Freq: Every day | ORAL | Status: DC
  Administered 2022-12-16: 20 mg via ORAL
  Filled 2022-12-15: qty 1

## 2022-12-15 MED ORDER — BUPIVACAINE-EPINEPHRINE (PF) 0.25% -1:200000 IJ SOLN
INTRAMUSCULAR | Status: DC | PRN
  Administered 2022-12-15: 30 mL

## 2022-12-15 MED ORDER — OXYCODONE HCL 5 MG PO TABS
5.0000 mg | ORAL_TABLET | ORAL | Status: DC | PRN
  Administered 2022-12-15 – 2022-12-16 (×2): 5 mg via ORAL
  Filled 2022-12-15 (×2): qty 1

## 2022-12-15 MED ORDER — POVIDONE-IODINE 10 % EX SWAB: 2.0000 | Freq: Once | CUTANEOUS | Status: DC

## 2022-12-15 MED ORDER — CARVEDILOL 6.25 MG PO TABS
6.2500 mg | ORAL_TABLET | Freq: Two times a day (BID) | ORAL | Status: DC
  Administered 2022-12-15 – 2022-12-16 (×2): 6.25 mg via ORAL
  Filled 2022-12-15 (×2): qty 1

## 2022-12-15 MED ORDER — METOCLOPRAMIDE HCL 5 MG PO TABS: 5.0000 mg | ORAL_TABLET | Freq: Three times a day (TID) | ORAL | Status: DC | PRN

## 2022-12-15 MED ORDER — LACTATED RINGERS IV SOLN: INTRAVENOUS | Status: DC

## 2022-12-15 MED ORDER — SODIUM CHLORIDE (PF) 0.9 % IJ SOLN
INTRAMUSCULAR | Status: DC | PRN
  Administered 2022-12-15: 30 mL

## 2022-12-15 MED ORDER — BUPIVACAINE HCL 0.25 % IJ SOLN
INTRAMUSCULAR | Status: AC
  Filled 2022-12-15: qty 1

## 2022-12-15 MED ORDER — SODIUM CHLORIDE 0.9 % IV SOLN: INTRAVENOUS | Status: DC

## 2022-12-15 MED ORDER — ONDANSETRON HCL 4 MG/2ML IJ SOLN: 4.0000 mg | Freq: Once | INTRAMUSCULAR | Status: DC | PRN

## 2022-12-15 MED ORDER — GABAPENTIN 300 MG PO CAPS: 600.0000 mg | ORAL_CAPSULE | Freq: Two times a day (BID) | ORAL | Status: DC

## 2022-12-15 MED ORDER — ORAL CARE MOUTH RINSE: 15.0000 mL | Freq: Once | OROMUCOSAL | Status: AC

## 2022-12-15 MED ORDER — ONDANSETRON HCL 4 MG PO TABS: 4.0000 mg | ORAL_TABLET | Freq: Four times a day (QID) | ORAL | Status: DC | PRN

## 2022-12-15 MED ORDER — INSULIN ASPART 100 UNIT/ML IJ SOLN: 0.0000 [IU] | INTRAMUSCULAR | Status: DC | PRN

## 2022-12-15 MED ORDER — APIXABAN 5 MG PO TABS
5.0000 mg | ORAL_TABLET | Freq: Two times a day (BID) | ORAL | Status: DC
  Administered 2022-12-16: 5 mg via ORAL
  Filled 2022-12-15: qty 1

## 2022-12-15 MED ORDER — AMIODARONE HCL 200 MG PO TABS
200.0000 mg | ORAL_TABLET | Freq: Every day | ORAL | Status: DC
  Administered 2022-12-16: 200 mg via ORAL
  Filled 2022-12-15: qty 1

## 2022-12-15 MED ORDER — FENTANYL CITRATE (PF) 100 MCG/2ML IJ SOLN
INTRAMUSCULAR | Status: AC
  Filled 2022-12-15: qty 2

## 2022-12-15 MED ORDER — BUPROPION HCL ER (SR) 150 MG PO TB12
150.0000 mg | ORAL_TABLET | Freq: Two times a day (BID) | ORAL | Status: DC
  Administered 2022-12-15 – 2022-12-16 (×2): 150 mg via ORAL
  Filled 2022-12-15 (×2): qty 1

## 2022-12-15 MED ORDER — FENTANYL CITRATE PF 50 MCG/ML IJ SOSY
50.0000 ug | PREFILLED_SYRINGE | INTRAMUSCULAR | Status: DC
  Administered 2022-12-15: 50 ug via INTRAVENOUS
  Filled 2022-12-15: qty 2

## 2022-12-15 MED ORDER — ROCURONIUM BROMIDE 100 MG/10ML IV SOLN
INTRAVENOUS | Status: DC | PRN
  Administered 2022-12-15: 50 mg via INTRAVENOUS

## 2022-12-15 MED ORDER — PROPOFOL 1000 MG/100ML IV EMUL
INTRAVENOUS | Status: AC
  Filled 2022-12-15: qty 100

## 2022-12-15 MED ORDER — MENTHOL 3 MG MT LOZG: 1.0000 | LOZENGE | OROMUCOSAL | Status: DC | PRN

## 2022-12-15 MED ORDER — TRANEXAMIC ACID-NACL 1000-0.7 MG/100ML-% IV SOLN
1000.0000 mg | INTRAVENOUS | Status: AC
  Administered 2022-12-15: 1000 mg via INTRAVENOUS
  Filled 2022-12-15: qty 100

## 2022-12-15 MED ORDER — KETOROLAC TROMETHAMINE 30 MG/ML IJ SOLN
INTRAMUSCULAR | Status: AC
  Filled 2022-12-15: qty 1

## 2022-12-15 MED ORDER — PROPOFOL 500 MG/50ML IV EMUL
INTRAVENOUS | Status: DC | PRN
  Administered 2022-12-15: 180 mg via INTRAVENOUS
  Administered 2022-12-15: 25 ug/kg/min via INTRAVENOUS

## 2022-12-15 MED ORDER — SODIUM CHLORIDE 0.9 % IR SOLN
Status: DC | PRN
  Administered 2022-12-15: 1000 mL

## 2022-12-15 MED ORDER — DOCUSATE SODIUM 100 MG PO CAPS
100.0000 mg | ORAL_CAPSULE | Freq: Two times a day (BID) | ORAL | Status: DC
  Administered 2022-12-16: 100 mg via ORAL
  Filled 2022-12-15 (×2): qty 1

## 2022-12-15 MED ORDER — GABAPENTIN 300 MG PO CAPS
600.0000 mg | ORAL_CAPSULE | Freq: Three times a day (TID) | ORAL | Status: DC
  Administered 2022-12-16 (×2): 600 mg via ORAL
  Filled 2022-12-15 (×2): qty 2

## 2022-12-15 MED ORDER — GABAPENTIN 300 MG PO CAPS: 300.0000 mg | ORAL_CAPSULE | ORAL | Status: DC

## 2022-12-15 MED ORDER — SUGAMMADEX SODIUM 200 MG/2ML IV SOLN
INTRAVENOUS | Status: DC | PRN
  Administered 2022-12-15: 200 mg via INTRAVENOUS

## 2022-12-15 MED ORDER — AMISULPRIDE (ANTIEMETIC) 5 MG/2ML IV SOLN: 10.0000 mg | Freq: Once | INTRAVENOUS | Status: DC | PRN

## 2022-12-15 MED ORDER — KETOROLAC TROMETHAMINE 30 MG/ML IJ SOLN
INTRAMUSCULAR | Status: DC | PRN
  Administered 2022-12-15: 30 mg via INTRAMUSCULAR

## 2022-12-15 MED ORDER — METHOCARBAMOL 500 MG PO TABS
500.0000 mg | ORAL_TABLET | Freq: Four times a day (QID) | ORAL | Status: DC | PRN
  Administered 2022-12-15 – 2022-12-16 (×2): 500 mg via ORAL
  Filled 2022-12-15 (×2): qty 1

## 2022-12-15 MED ORDER — FENTANYL CITRATE PF 50 MCG/ML IJ SOSY
PREFILLED_SYRINGE | INTRAMUSCULAR | Status: AC
  Administered 2022-12-15: 50 ug via INTRAVENOUS
  Filled 2022-12-15: qty 2

## 2022-12-15 MED ORDER — ACETAMINOPHEN 500 MG PO TABS
1000.0000 mg | ORAL_TABLET | Freq: Four times a day (QID) | ORAL | Status: DC
  Administered 2022-12-15 – 2022-12-16 (×4): 1000 mg via ORAL
  Filled 2022-12-15 (×4): qty 2

## 2022-12-15 MED ORDER — DEXAMETHASONE SODIUM PHOSPHATE 10 MG/ML IJ SOLN
10.0000 mg | Freq: Once | INTRAMUSCULAR | Status: AC
  Administered 2022-12-16: 10 mg via INTRAVENOUS
  Filled 2022-12-15: qty 1

## 2022-12-15 MED ORDER — ATORVASTATIN CALCIUM 40 MG PO TABS
80.0000 mg | ORAL_TABLET | Freq: Every day | ORAL | Status: DC
  Administered 2022-12-16: 80 mg via ORAL
  Filled 2022-12-15: qty 2

## 2022-12-15 MED ORDER — CHLORHEXIDINE GLUCONATE 0.12 % MT SOLN
15.0000 mL | Freq: Once | OROMUCOSAL | Status: AC
  Administered 2022-12-15: 15 mL via OROMUCOSAL

## 2022-12-15 MED ORDER — METOCLOPRAMIDE HCL 5 MG/ML IJ SOLN: 5.0000 mg | Freq: Three times a day (TID) | INTRAMUSCULAR | Status: DC | PRN

## 2022-12-15 SURGICAL SUPPLY — 60 items
ADH SKN CLS APL DERMABOND .7 (GAUZE/BANDAGES/DRESSINGS) ×1
ATTUNE MED ANAT PAT 41 KNEE (Knees) IMPLANT
BAG COUNTER SPONGE SURGICOUNT (BAG) IMPLANT
BAG SPEC THK2 15X12 ZIP CLS (MISCELLANEOUS)
BAG SPNG CNTER NS LX DISP (BAG)
BAG ZIPLOCK 12X15 (MISCELLANEOUS) IMPLANT
BASE TIBIAL CEM ATTUNE SZ 7 (Knees) ×1 IMPLANT
BASEPLATE TIB CEM ATTUNE SZ7 (Knees) IMPLANT
BLADE SAW SGTL 11.0X1.19X90.0M (BLADE) IMPLANT
BLADE SAW SGTL 13.0X1.19X90.0M (BLADE) ×1 IMPLANT
BNDG CMPR 5X62 HK CLSR LF (GAUZE/BANDAGES/DRESSINGS) ×1
BNDG CMPR 6"X 5 YARDS HK CLSR (GAUZE/BANDAGES/DRESSINGS) ×1
BNDG CMPR MED 10X6 ELC LF (GAUZE/BANDAGES/DRESSINGS) ×1
BNDG ELASTIC 6INX 5YD STR LF (GAUZE/BANDAGES/DRESSINGS) ×1 IMPLANT
BNDG ELASTIC 6X10 VLCR STRL LF (GAUZE/BANDAGES/DRESSINGS) IMPLANT
BOWL SMART MIX CTS (DISPOSABLE) ×1 IMPLANT
BSPLAT TIB 7 CMNT FX BRNG STRL (Knees) ×1 IMPLANT
CEMENT HV SMART SET (Cement) IMPLANT
COMP FEM CMT ATTUNE KNEE 7 RT (Joint) ×1 IMPLANT
COMPONENT FEM CMT ATTN KN 7 RT (Joint) IMPLANT
COVER SURGICAL LIGHT HANDLE (MISCELLANEOUS) ×1 IMPLANT
CUFF TOURN SGL QUICK 34 (TOURNIQUET CUFF) ×1
CUFF TRNQT CYL 34X4.125X (TOURNIQUET CUFF) ×1 IMPLANT
DERMABOND ADVANCED .7 DNX12 (GAUZE/BANDAGES/DRESSINGS) ×1 IMPLANT
DRAPE U-SHAPE 47X51 STRL (DRAPES) ×1 IMPLANT
DRESSING AQUACEL AG SP 3.5X10 (GAUZE/BANDAGES/DRESSINGS) ×1 IMPLANT
DRSG AQUACEL AG ADV 3.5X10 (GAUZE/BANDAGES/DRESSINGS) IMPLANT
DRSG AQUACEL AG SP 3.5X10 (GAUZE/BANDAGES/DRESSINGS) ×1
DURAPREP 26ML APPLICATOR (WOUND CARE) ×2 IMPLANT
ELECT REM PT RETURN 15FT ADLT (MISCELLANEOUS) ×1 IMPLANT
GLOVE BIO SURGEON STRL SZ 6 (GLOVE) ×1 IMPLANT
GLOVE BIOGEL PI IND STRL 6.5 (GLOVE) ×1 IMPLANT
GLOVE BIOGEL PI IND STRL 7.5 (GLOVE) ×1 IMPLANT
GLOVE ORTHO TXT STRL SZ7.5 (GLOVE) ×2 IMPLANT
GOWN STRL REUS W/ TWL LRG LVL3 (GOWN DISPOSABLE) ×2 IMPLANT
GOWN STRL REUS W/TWL LRG LVL3 (GOWN DISPOSABLE) ×2
HANDPIECE INTERPULSE COAX TIP (DISPOSABLE) ×1
HOLDER FOLEY CATH W/STRAP (MISCELLANEOUS) IMPLANT
INSERT TIB CMT MED KNEE 7 5 RT (Insert) IMPLANT
KIT TURNOVER KIT A (KITS) IMPLANT
MANIFOLD NEPTUNE II (INSTRUMENTS) ×1 IMPLANT
NDL SAFETY ECLIP 18X1.5 (MISCELLANEOUS) IMPLANT
NS IRRIG 1000ML POUR BTL (IV SOLUTION) ×1 IMPLANT
PACK TOTAL KNEE CUSTOM (KITS) ×1 IMPLANT
PIN FIX SIGMA LCS THRD HI (PIN) IMPLANT
PROTECTOR NERVE ULNAR (MISCELLANEOUS) ×1 IMPLANT
SET HNDPC FAN SPRY TIP SCT (DISPOSABLE) ×1 IMPLANT
SET PAD KNEE POSITIONER (MISCELLANEOUS) ×1 IMPLANT
SPIKE FLUID TRANSFER (MISCELLANEOUS) ×2 IMPLANT
SUT MNCRL AB 4-0 PS2 18 (SUTURE) ×1 IMPLANT
SUT STRATAFIX PDS+ 0 24IN (SUTURE) ×1 IMPLANT
SUT VIC AB 1 CT1 36 (SUTURE) ×1 IMPLANT
SUT VIC AB 2-0 CT1 27 (SUTURE) ×2
SUT VIC AB 2-0 CT1 TAPERPNT 27 (SUTURE) ×2 IMPLANT
SYR 3ML LL SCALE MARK (SYRINGE) ×1 IMPLANT
TOWEL GREEN STERILE FF (TOWEL DISPOSABLE) ×1 IMPLANT
TRAY FOLEY MTR SLVR 16FR STAT (SET/KITS/TRAYS/PACK) ×1 IMPLANT
TUBE SUCTION HIGH CAP CLEAR NV (SUCTIONS) ×1 IMPLANT
WATER STERILE IRR 1000ML POUR (IV SOLUTION) ×2 IMPLANT
WRAP KNEE MAXI GEL POST OP (GAUZE/BANDAGES/DRESSINGS) ×1 IMPLANT

## 2022-12-15 NOTE — Anesthesia Procedure Notes (Signed)
Procedure Name: Intubation Date/Time: 12/15/2022 2:02 PM  Performed by: Elisabeth Cara, CRNAPre-anesthesia Checklist: Patient identified, Emergency Drugs available, Suction available, Patient being monitored and Timeout performed Patient Re-evaluated:Patient Re-evaluated prior to induction Oxygen Delivery Method: Circle system utilized Preoxygenation: Pre-oxygenation with 100% oxygen Induction Type: IV induction Ventilation: Mask ventilation without difficulty Laryngoscope Size: Mac and 4 Grade View: Grade I Tube type: Oral Tube size: 7.5 mm Number of attempts: 1 Airway Equipment and Method: Stylet Placement Confirmation: ETT inserted through vocal cords under direct vision, positive ETCO2 and breath sounds checked- equal and bilateral Secured at: 22 cm Tube secured with: Tape Dental Injury: Teeth and Oropharynx as per pre-operative assessment

## 2022-12-15 NOTE — Plan of Care (Signed)
  Problem: Coping: Goal: Level of anxiety will decrease Outcome: Progressing   Problem: Elimination: Goal: Will not experience complications related to bowel motility Outcome: Progressing Goal: Will not experience complications related to urinary retention Outcome: Progressing   Problem: Pain Managment: Goal: General experience of comfort will improve Outcome: Progressing   

## 2022-12-15 NOTE — Op Note (Signed)
NAME:  Tanner [name]                      MEDICAL RECORD NO.:  [id number]                             FACILITY:  [facility]      PHYSICIAN:  Madlyn Frankel. Charlann Boxer, M.D.  DATE OF BIRTH:  Feb 05, 1946      DATE OF PROCEDURE:  12/15/2022                                     OPERATIVE REPORT         PREOPERATIVE DIAGNOSIS:  Right knee osteoarthritis.      POSTOPERATIVE DIAGNOSIS:  Right knee osteoarthritis.      FINDINGS:  The patient was noted to have complete loss of cartilage and   bone-on-bone arthritis with associated osteophytes in the medial and patellofemoral compartments of   the knee with a significant synovitis and associated effusion.  The patient had failed months of conservative treatment including medications, injection therapy, activity modification.     PROCEDURE:  Right total knee replacement.      COMPONENTS USED:  DePuy Attune FB CR MS knee   system, a size 7 femur, 7 tibia, size 5 mm CR MS AOX insert, and 41 anatomic patellar   button.      SURGEON:  Madlyn Frankel. Charlann Boxer, M.D.      ASSISTANT:  Rosalene Billings, PA-C.      ANESTHESIA:  Regional and Spinal.      SPECIMENS:  None.      COMPLICATION:  None.      DRAINS:  None.  EBL: <200 cc      TOURNIQUET TIME:  29 min at 225 mmHg     The patient was stable to the recovery room.      INDICATION FOR PROCEDURE:  Tanner [name] is a 77 y.o. male patient of   mine.  The patient had been seen, evaluated, and treated for months conservatively in the   office with medication, activity modification, and injections.  The patient had   radiographic changes of bone-on-bone arthritis with endplate sclerosis and osteophytes noted.  Based on the radiographic changes and failed conservative measures, the patient   decided to proceed with definitive treatment, total knee replacement.  Risks of infection, DVT, component failure, need for revision surgery, neurovascular injury were reviewed in the office setting.  The postop course was reviewed  stressing the efforts to maximize post-operative satisfaction and function.  Consent was obtained for benefit of pain   relief.      PROCEDURE IN DETAIL:  The patient was brought to the operative theater.   Once adequate anesthesia, preoperative antibiotics, 2 gm of Ancef,1 gm of Tranexamic Acid, and 10 mg of Decadron administered, the patient was positioned supine with a right thigh tourniquet placed.  The  right lower extremity was prepped and draped in sterile fashion.  A time-   out was performed identifying the patient, planned procedure, and the appropriate extremity.      The right lower extremity was placed in the [facility] leg holder.  The leg was   exsanguinated, tourniquet elevated to 225 mmHg.  A midline incision was   made followed by median parapatellar arthrotomy.  Following initial   exposure, attention was  first directed to the patella.  Precut   measurement was noted to be 25 mm.  I resected down to 14 mm and used a   41 anatomic patellar button to restore patellar height as well as cover the cut surface.      The lug holes were drilled and a metal shim was placed to protect the   patella from retractors and saw blade during the procedure.      At this point, attention was now directed to the femur.  The femoral   canal was opened with a drill, irrigated to try to prevent fat emboli.  An   intramedullary rod was passed at 5 degrees valgus, 10 mm of bone was   resected off the distal femur.  Following this resection, the tibia was   subluxated anteriorly.  Using the extramedullary guide, 2 mm of bone was resected off   the proximal medial tibia.  We confirmed the gap would be   stable medially and laterally with a size 5 spacer block as well as confirmed that the tibial cut was perpendicular in the coronal plane, checking with an alignment rod.      Once this was done, I sized the femur to be a size 7 in the anterior-   posterior dimension, chose a standard component based on  medial and   lateral dimension.  The size 7 rotation block was then pinned in   position anterior referenced using the C-clamp to set rotation.  The   anterior, posterior, and  chamfer cuts were made without difficulty nor   notching making certain that I was along the anterior cortex to help   with flexion gap stability.      The final box cut was made off the lateral aspect of distal femur.      At this point, the tibia was sized to be a size 7.  The size 7 tray was   then pinned in position through the medial third of the tubercle,   drilled, and keel punched.  Trial reduction was now carried with a 7 femur,  7 tibia, a size 5 mm CR MS insert, and the 41 anatomic patella botton.  The knee was brought to full extension with good flexion stability with the patella   tracking through the trochlea without application of pressure.  Given   all these findings the trial components removed.  Final components were   opened and cement was mixed.  The knee was irrigated with normal saline solution and pulse lavage.  The synovial lining was   then injected with 30 cc of 0.25% Marcaine with epinephrine, 1 cc of Toradol and 30 cc of NS for a total of 61 cc.     Final implants were then cemented onto cleaned and dried cut surfaces of bone with the knee brought to extension with a size 5 mm CR MS trial insert.      Once the cement had fully cured, excess cement was removed   throughout the knee.  I confirmed that I was satisfied with the range of   motion and stability, and the final size 5 mm CR MS AOX insert was chosen.  It was   placed into the knee.      The tourniquet had been let down at 29 minutes.  No significant   hemostasis was required.  The extensor mechanism was then reapproximated using #1 Vicryl and #1 Stratafix sutures with the knee   in flexion.  The   remaining wound was closed with 2-0 Vicryl and running 4-0 Monocryl.   The knee was cleaned, dried, dressed sterilely using Dermabond  and   Aquacel dressing.  The patient was then   brought to recovery room in stable condition, tolerating the procedure   well.   Please note that Physician Assistant, Rosalene Billings, PA-C was present for the entirety of the case, and was utilized for pre-operative positioning, peri-operative retractor management, general facilitation of the procedure and for primary wound closure at the end of the case.              Madlyn Frankel Charlann Boxer, M.D.    12/15/2022 12:36 PM

## 2022-12-15 NOTE — Interval H&P Note (Signed)
History and Physical Interval Note:  12/15/2022 12:36 PM  Tanner [name]  has presented today for surgery, with the diagnosis of Right knee osteoarthritis.  The various methods of treatment have been discussed with the patient and family. After consideration of risks, benefits and other options for treatment, the patient has consented to  Procedure(s): TOTAL KNEE ARTHROPLASTY (Right) as a surgical intervention.  [name] history has been reviewed, patient examined, no change in status, stable for surgery.  I have reviewed the patient's chart and labs.  Questions were answered to the patient's satisfaction.     Shelda Pal

## 2022-12-15 NOTE — Discharge Instructions (Signed)

## 2022-12-15 NOTE — Progress Notes (Signed)
Pt has noted red scrape to left knee. Area cleaned with soap and water. Tegaderm applied

## 2022-12-15 NOTE — Anesthesia Postprocedure Evaluation (Signed)
Anesthesia Post Note  Patient: Tanner [name]  Procedure(s) Performed: TOTAL KNEE ARTHROPLASTY (Right: Knee)     Patient location during evaluation: PACU Anesthesia Type: Regional and General Level of consciousness: awake Pain management: pain level controlled Vital Signs Assessment: post-procedure vital signs reviewed and stable Respiratory status: spontaneous breathing, nonlabored ventilation and respiratory function stable Cardiovascular status: blood pressure returned to baseline and stable Postop Assessment: no apparent nausea or vomiting Anesthetic complications: no   No notable events documented.  Last Vitals:  Vitals:   12/15/22 1645 12/15/22 1700  BP: (!) 160/63 (!) 173/83  Pulse: 61   Resp: 19 13  Temp:    SpO2: 95% 98%    Last Pain:  Vitals:   12/15/22 1700  TempSrc:   PainSc: 0-No pain                 Abhiram Criado P Augie Vane

## 2022-12-15 NOTE — Transfer of Care (Signed)
Immediate Anesthesia Transfer of Care Note  Patient: Tanner [name]  Procedure(s) Performed: TOTAL KNEE ARTHROPLASTY (Right: Knee)  Patient Location: PACU  Anesthesia Type:General  Level of Consciousness: sedated, patient cooperative, and responds to stimulation  Airway & Oxygen Therapy: Patient Spontanous Breathing and Patient connected to face mask oxygen  Post-op Assessment: Report given to RN and Post -op Vital signs reviewed and stable  Post vital signs: Reviewed and stable  Last Vitals:  Vitals Value Taken Time  BP 154/65 12/15/22 1536  Temp    Pulse 60 12/15/22 1539  Resp 14 12/15/22 1539  SpO2 100 % 12/15/22 1539  Vitals shown include unvalidated device data.  Last Pain:  Vitals:   12/15/22 1205  TempSrc:   PainSc: 3       Patients Stated Pain Goal: 3 (12/15/22 1205)  Complications: No notable events documented.

## 2022-12-15 NOTE — Anesthesia Procedure Notes (Signed)
Anesthesia Regional Block: Adductor canal block   Pre-Anesthetic Checklist: , timeout performed,  Correct Patient, Correct Site, Correct Laterality,  Correct Procedure,, site marked,  Risks and benefits discussed,  Surgical consent,  Pre-op evaluation,  At surgeon's request and post-op pain management  Laterality: Right  Prep: chloraprep       Needles:  Injection technique: Single-shot  Needle Type: Echogenic Stimulator Needle     Needle Length: 10cm  Needle Gauge: 20     Additional Needles:   Procedures:,,,, ultrasound used (permanent image in chart),,    Narrative:  Start time: 12/15/2022 12:55 PM End time: 12/15/2022 1:05 PM Injection made incrementally with aspirations every 5 mL.  Performed by: Personally  Anesthesiologist: Leonides Grills, MD  Additional Notes: Functioning IV was confirmed and monitors were applied. A time-out was performed. Hand hygiene and sterile gloves were used. The thigh was placed in a frog-leg position and prepped in a sterile fashion. A 20ga Bbraun echogenic stimulator needle was placed using ultrasound guidance.  Negative aspiration and negative test dose prior to incremental administration of local anesthetic. The patient tolerated the procedure well.

## 2022-12-15 NOTE — Telephone Encounter (Signed)
Last Visit:12/02/2022     Upcoming appointments: 06/07/2023           Mayer Camel, CNA  12/15/2022, 09:27

## 2022-12-16 ENCOUNTER — Encounter (HOSPITAL_COMMUNITY): Payer: Self-pay | Admitting: Orthopedic Surgery

## 2022-12-16 DIAGNOSIS — M1711 Unilateral primary osteoarthritis, right knee: Secondary | ICD-10-CM | POA: Diagnosis not present

## 2022-12-16 LAB — GLUCOSE, CAPILLARY
Glucose-Capillary: 127 mg/dL — ABNORMAL HIGH (ref 70–99)
Glucose-Capillary: 127 mg/dL — ABNORMAL HIGH (ref 70–99)

## 2022-12-16 LAB — BASIC METABOLIC PANEL
Anion gap: 10 (ref 5–15)
BUN: 22 mg/dL (ref 8–23)
CO2: 25 mmol/L (ref 22–32)
Calcium: 8.5 mg/dL — ABNORMAL LOW (ref 8.9–10.3)
Chloride: 101 mmol/L (ref 98–111)
Creatinine, Ser: 1.56 mg/dL — ABNORMAL HIGH (ref 0.61–1.24)
GFR, Estimated: 45 mL/min — ABNORMAL LOW (ref >60)
Glucose, Bld: 144 mg/dL — ABNORMAL HIGH (ref 70–99)
Potassium: 4.7 mmol/L (ref 3.5–5.1)
Sodium: 136 mmol/L (ref 135–145)

## 2022-12-16 LAB — CBC
HCT: 39.3 % (ref 39.0–52.0)
Hemoglobin: 12.2 g/dL — ABNORMAL LOW (ref 13.0–17.0)
MCH: 26.8 pg (ref 26.0–34.0)
MCHC: 31 g/dL (ref 30.0–36.0)
MCV: 86.4 fL (ref 80.0–100.0)
Platelets: 169 10*3/uL (ref 150–400)
RBC: 4.55 MIL/uL (ref 4.22–5.81)
RDW: 15.2 % (ref 11.5–15.5)
WBC: 8.9 10*3/uL (ref 4.0–10.5)
nRBC: 0 % (ref 0.0–0.2)

## 2022-12-16 MED ORDER — METHOCARBAMOL 500 MG PO TABS: 500.0000 mg | ORAL_TABLET | Freq: Four times a day (QID) | ORAL | 2 refills | Status: DC | PRN

## 2022-12-16 MED ORDER — SENNA 8.6 MG PO TABS: 2.0000 | ORAL_TABLET | Freq: Every day | ORAL | 0 refills | Status: AC

## 2022-12-16 MED ORDER — CEFADROXIL 500 MG PO CAPS: 500.0000 mg | ORAL_CAPSULE | Freq: Two times a day (BID) | ORAL | 0 refills | Status: AC

## 2022-12-16 MED ORDER — OXYCODONE HCL 5 MG PO TABS: 5.0000 mg | ORAL_TABLET | ORAL | 0 refills | Status: DC | PRN

## 2022-12-16 MED ORDER — POLYETHYLENE GLYCOL 3350 17 G PO PACK: 17.0000 g | PACK | Freq: Two times a day (BID) | ORAL | 0 refills | Status: DC

## 2022-12-16 NOTE — Plan of Care (Signed)

## 2022-12-16 NOTE — Progress Notes (Signed)
Subjective: 1 Day Post-Op Procedure(s) (LRB): TOTAL KNEE ARTHROPLASTY (Right) Patient reports pain as mild.   Patient seen in rounds with Dr. Charlann Boxer. Patient is well, and has had no acute complaints or problems. No acute events overnight. Foley catheter removed. Patient has not been up with PT yet.  We will start therapy today.   Objective: Vital signs in last 24 hours: Temp:  [96.9 F (36.1 C)-98.2 F (36.8 C)] 98.2 F (36.8 C) (07/03 0617) Pulse Rate:  [60-82] 68 (07/03 0617) Resp:  [10-20] 16 (07/03 0617) BP: (118-186)/(63-92) 118/64 (07/03 0617) SpO2:  [90 %-100 %] 97 % (07/03 0617) Weight:  [106.6 kg] 106.6 kg (07/02 1205)  Intake/Output from previous day:  Intake/Output Summary (Last 24 hours) at 12/16/2022 0754 Last data filed at 12/16/2022 0617 Gross per 24 hour  Intake 3408.37 ml  Output 1900 ml  Net 1508.37 ml     Intake/Output this shift: No intake/output data recorded.  Labs: Recent Labs    12/16/22 0335  HGB 12.2*   Recent Labs    12/16/22 0335  WBC 8.9  RBC 4.55  HCT 39.3  PLT 169   Recent Labs    12/16/22 0335  NA 136  K 4.7  CL 101  CO2 25  BUN 22  CREATININE 1.56*  GLUCOSE 144*  CALCIUM 8.5*   No results for input(s): "LABPT", "INR" in the last 72 hours.  Exam: General - Patient is Alert and Oriented Extremity - Neurologically intact Sensation intact distally Intact pulses distally Dorsiflexion/Plantar flexion intact Dressing - dressing C/D/I Motor Function - intact, moving foot and toes well on exam.   Past Medical History:  Diagnosis Date   Acute kidney failure (HCC)    Anxiety    Arthritis    Back pain    BPH (benign prostatic hypertrophy)    CHF (congestive heart failure) (HCC)    Clostridium difficile infection    Clotting disorder (HCC)    Depression    Diabetes (HCC) 12/11/2016   type 2    DVT (deep venous thrombosis) (HCC)    DVT of deep femoral vein, right (HCC) 06/29/2018   Edema, lower extremity    High  cholesterol    Hypertension    Joint pain    Low back pain potentially associated with spinal stenosis    Neuromuscular disorder (HCC)    Neuropathy of lower extremity    bilateral   OSA (obstructive sleep apnea)    pt denies    Osteoarthritis    PE (pulmonary embolism)    Pneumonia    hx of x 2    Ventral hernia     Assessment/Plan: 1 Day Post-Op Procedure(s) (LRB): TOTAL KNEE ARTHROPLASTY (Right) Principal Problem:   S/P total knee arthroplasty, right  Estimated body mass index is 38.51 kg/m as calculated from the following:   Height as of this encounter: 5' 5.5" (1.664 m).   Weight as of this encounter: 106.6 kg. Advance diet Up with therapy D/C IV fluids   Patient's anticipated LOS is less than 2 midnights, meeting these requirements: - Younger than 43 - Lives within 1 hour of care - Has a competent adult at home to recover with post-op recover - NO history of  - Chronic pain requiring opiods  - Diabetes  - Coronary Artery Disease  - Heart failure  - Heart attack  - Stroke  - DVT/VTE  - Cardiac arrhythmia  - Respiratory Failure/COPD  - Renal failure  - Anemia  -  Advanced Liver disease     DVT Prophylaxis -  Eliquis Weight bearing as tolerated.  Hgb stable at 12.2 this AM. Cr 1.56, near baseline   Will place on 1 week po abx  Plan is to go Home after hospital stay. Plan for discharge today following 1-2 sessions of PT as long as they are meeting their goals. Patient is scheduled for OPPT. Follow up in the office in 2 weeks.   Rosalene Billings, PA-C Orthopedic Surgery [phone] 12/16/2022, 7:54 AM

## 2022-12-16 NOTE — Progress Notes (Signed)
Physical Therapy Treatment Patient Details Name: Tanner [name] MRN: [id number] DOB: Dec 04, 1945 Today's Date: 12/16/2022   History of Present Illness 77 yo male s/p R TKA on 12/15/22. PMH: PE, HTN, DVT, DM, depression, anxiety, OA, L TKA 2022    PT Comments  Pt progressing well this session, son present for session. Pt is meeting goals and motivated to d/c home today.     Assistance Recommended at Discharge Frequent or constant Supervision/Assistance  If plan is discharge home, recommend the following:  Can travel by private vehicle    A little help with walking and/or transfers;A little help with bathing/dressing/bathroom;Assistance with cooking/housework;Assist for transportation;Help with stairs or ramp for entrance      Equipment Recommendations  None recommended by PT    Recommendations for Other Services       Precautions / Restrictions Precautions Precautions: Fall;Knee Restrictions Weight Bearing Restrictions: No RLE Weight Bearing: Weight bearing as tolerated     Mobility  Bed Mobility Overal bed mobility: Needs Assistance Bed Mobility: Supine to Sit     Supine to sit: Supervision     General bed mobility comments: in recliner'    Transfers Overall transfer level: Needs assistance Equipment used: Rolling walker (2 wheels) Transfers: Sit to/from Stand Sit to Stand: Min guard           General transfer comment: cues for hand placement, min/guard for safety    Ambulation/Gait Ambulation/Gait assistance: Min guard Gait Distance (Feet): 60 Feet Assistive device: Rolling walker (2 wheels) Gait Pattern/deviations: Step-to pattern, Step-through pattern       General Gait Details: cues for posture and proximity to [employer] Stairs: Yes Stairs assistance: Min guard Stair Management: One rail Right, Step to pattern, Forwards, With cane Number of Stairs: 5 (x2) General stair comments: cues for sequence and technique; son present and able to  assist/cue as needed; good stability, no LON or knee buckling noted   Wheelchair Mobility     Tilt Bed    Modified Rankin (Stroke Patients Only)       Balance Overall balance assessment: Mild deficits observed, not formally tested                                          Cognition Arousal/Alertness: Awake/alert Behavior During Therapy: WFL for tasks assessed/performed Overall Cognitive Status: Within Functional Limits for tasks assessed                                          Exercises Total Joint Exercises Ankle Circles/Pumps: AROM, Both, 10 reps Quad Sets: 10 reps, Both, AROM Heel Slides: AROM, Right, 10 reps    General Comments        Pertinent Vitals/Pain Pain Assessment Pain Assessment: No/denies pain Pain Score: 1  Pain Location: right knee Pain Descriptors / Indicators: Grimacing Pain Intervention(s): Limited activity within patient's tolerance, Monitored during session    Home Living Family/patient expects to be discharged to:: Private residence Living Arrangements: Alone Available Help at Discharge: Family Type of Home: House Home Access: Stairs to enter Entrance Stairs-Rails: Right Entrance Stairs-Number of Steps: 4-5   Home Layout: One level Home Equipment: Agricultural consultant (2 wheels);Gilmer Mor - single point      Prior Function  PT Goals (current goals can now be found in the care plan section) Acute Rehab PT Goals PT Goal Formulation: With patient Time For Goal Achievement: 12/23/22 Potential to Achieve Goals: Good Progress towards PT goals: Progressing toward goals    Frequency    7X/week      PT Plan Current plan remains appropriate    Co-evaluation              AM-PAC PT "6 Clicks" Mobility   Outcome Measure  Help needed turning from your back to your side while in a flat bed without using bedrails?: A Little Help needed moving from lying on your back to sitting on the side  of a flat bed without using bedrails?: A Little Help needed moving to and from a bed to a chair (including a wheelchair)?: A Little Help needed standing up from a chair using your arms (e.g., wheelchair or bedside chair)?: A Little Help needed to walk in hospital room?: A Little Help needed climbing 3-5 steps with a railing? : A Little 6 Click Score: 18    End of Session Equipment Utilized During Treatment: Gait belt Activity Tolerance: Patient tolerated treatment well Patient left: in chair;with call bell/phone within reach;with chair alarm set;with family/visitor present Nurse Communication: Mobility status PT Visit Diagnosis: Other abnormalities of gait and mobility (R26.89);Difficulty in walking, not elsewhere classified (R26.2)     Time: 4098-1191 PT Time Calculation (min) (ACUTE ONLY): 27 min  Charges:    $Gait Training: 23-37 mins PT General Charges $$ ACUTE PT VISIT: 1 Visit                     Ilijah Doucet, PT  Acute Rehab Dept ([facility]) [phone]  12/16/2022    [facility] 12/16/2022, 2:21 PM

## 2022-12-16 NOTE — Evaluation (Signed)
Physical Therapy Evaluation Patient Details Name: Tanner [name] MRN: [id number] DOB: 19-Apr-1946 Today's Date: 12/16/2022  History of Present Illness  77 yo male s/p R TKA on 12/15/22. PMH: PE, HTN, DVT, DM, depression, anxiety, OA, L TKA 2022  Clinical Impression  Pt is s/p TKA resulting in the deficits listed below (see PT Problem List).  Pt amb ~ 120' with RW and min assist to min/guard. Son present for session. Pt motivated to d/c home if meeting goals second session.   Pt will benefit from acute skilled PT to increase their independence and safety with mobility to allow discharge.          Assistance Recommended at Discharge Frequent or constant Supervision/Assistance  If plan is discharge home, recommend the following:  Can travel by private vehicle  A little help with walking and/or transfers;A little help with bathing/dressing/bathroom;Assistance with cooking/housework;Assist for transportation;Help with stairs or ramp for entrance        Equipment Recommendations None recommended by PT  Recommendations for Other Services       Functional Status Assessment Patient has had a recent decline in their functional status and demonstrates the ability to make significant improvements in function in a reasonable and predictable amount of time.     Precautions / Restrictions Precautions Precautions: Fall Restrictions Weight Bearing Restrictions: No RLE Weight Bearing: Weight bearing as tolerated      Mobility  Bed Mobility Overal bed mobility: Needs Assistance Bed Mobility: Supine to Sit     Supine to sit: Supervision     General bed mobility comments: for safety, incr time    Transfers Overall transfer level: Needs assistance Equipment used: Rolling walker (2 wheels) Transfers: Sit to/from Stand Sit to Stand: Min guard           General transfer comment: cues for hand placement, min/guard for safety    Ambulation/Gait Ambulation/Gait assistance: Min  guard Gait Distance (Feet): 120 Feet Assistive device: Rolling walker (2 wheels) Gait Pattern/deviations: Step-to pattern, Step-through pattern       General Gait Details: initial knee instability/slight R knee buckling which improved with cues and distance. no overt LOB, min/guard for safety  Stairs            Wheelchair Mobility     Tilt Bed    Modified Rankin (Stroke Patients Only)       Balance Overall balance assessment: Mild deficits observed, not formally tested                                           Pertinent Vitals/Pain Pain Assessment Pain Assessment: 0-10 Pain Score: 1  Pain Location: right knee Pain Descriptors / Indicators: Grimacing Pain Intervention(s): Limited activity within patient's tolerance, Monitored during session    Home Living Family/patient expects to be discharged to:: Private residence Living Arrangements: Alone Available Help at Discharge: Family Type of Home: House Home Access: Stairs to enter Entrance Stairs-Rails: Right Entrance Stairs-Number of Steps: 4-5   Home Layout: One level Home Equipment: Agricultural consultant (2 wheels);Cane - single point      Prior Function Prior Level of Function : Independent/Modified Independent             Mobility Comments: amb with RW d/t pain with incr distance       Hand Dominance        Extremity/Trunk Assessment   Upper Extremity Assessment  Upper Extremity Assessment: Overall WFL for tasks assessed    Lower Extremity Assessment Lower Extremity Assessment: RLE deficits/detail RLE Deficits / Details: ankle grossly WFL for pt baseline; knee extension and hip flexion ~3/5; knee AROM ~ 6 to 60 degrees flexion       Communication   Communication: No difficulties  Cognition Arousal/Alertness: Awake/alert Behavior During Therapy: WFL for tasks assessed/performed Overall Cognitive Status: Within Functional Limits for tasks assessed                                           General Comments      Exercises Total Joint Exercises Ankle Circles/Pumps: AROM, Both, 10 reps Quad Sets: 10 reps, Both, AROM Heel Slides: AROM, Right, 10 reps   Assessment/Plan    PT Assessment Patient needs continued PT services  PT Problem List Decreased strength;Decreased range of motion;Decreased balance;Decreased mobility;Decreased knowledge of precautions;Obesity       PT Treatment Interventions DME instruction;Therapeutic exercise;Gait training;Stair training;Functional mobility training;Therapeutic activities;Patient/family education    PT Goals (Current goals can be found in the Care Plan section)  Acute Rehab PT Goals PT Goal Formulation: With patient Time For Goal Achievement: 12/23/22 Potential to Achieve Goals: Good    Frequency 7X/week     Co-evaluation               AM-PAC PT "6 Clicks" Mobility  Outcome Measure Help needed turning from your back to your side while in a flat bed without using bedrails?: A Little Help needed moving from lying on your back to sitting on the side of a flat bed without using bedrails?: A Little Help needed moving to and from a bed to a chair (including a wheelchair)?: A Little Help needed standing up from a chair using your arms (e.g., wheelchair or bedside chair)?: A Little Help needed to walk in hospital room?: A Little   6 Click Score: 15    End of Session Equipment Utilized During Treatment: Gait belt Activity Tolerance: Patient tolerated treatment well Patient left: in chair;with call bell/phone within reach;with chair alarm set;with family/visitor present   PT Visit Diagnosis: Other abnormalities of gait and mobility (R26.89);Difficulty in walking, not elsewhere classified (R26.2)    Time: 1103-1130 PT Time Calculation (min) (ACUTE ONLY): 27 min   Charges:   PT Evaluation $PT Eval Low Complexity: 1 Low PT Treatments $Gait Training: 8-22 mins PT General Charges $$ ACUTE  PT VISIT: 1 Visit         Raeanna Soberanes, PT  Acute Rehab Dept [facility]) [phone]  12/16/2022   [facility] 12/16/2022, 11:42 AM

## 2022-12-16 NOTE — TOC Transition Note (Signed)
Transition of Care [facility]) - CM/SW Discharge Note   Patient Details  Name: Tanner [name] MRN: [id number] Date of Birth: 05-Mar-1946  Transition of Care [facility]) CM/SW Contact:  Amada Jupiter, LCSW Phone Number: 12/16/2022, 10:23 AM   Clinical Narrative:     Met with pt and confirming he has needed DME in the home.  OPPT already arranged with [facility]).  No TOC needs.  Final next level of care: OP Rehab Barriers to Discharge: No Barriers Identified   Patient Goals and CMS Choice      Discharge Placement                         Discharge Plan and Services Additional resources added to the After Visit Summary for                  DME Arranged: N/A DME Agency: NA                  Social Determinants of Health (SDOH) Interventions SDOH Screenings   Food Insecurity: No Food Insecurity (12/15/2022)  Housing: Low Risk  (12/15/2022)  Transportation Needs: No Transportation Needs (12/15/2022)  Utilities: Not At Risk (12/15/2022)  Alcohol Screen: Low Risk  (10/27/2022)  Depression (PHQ2-9): Low Risk  (10/27/2022)  Financial Resource Strain: Low Risk  (10/27/2022)  Physical Activity: Inactive (10/27/2022)  Social Connections: Moderately Integrated (10/27/2022)  Stress: Stress Concern Present (10/30/2022)  Tobacco Use: Medium Risk (12/15/2022)     Readmission Risk Interventions    08/20/2022   11:10 AM  Readmission Risk Prevention Plan  Transportation Screening Complete  PCP or Specialist Appt within 3-5 Days Complete  HRI or Home Care Consult Complete  Palliative Care Screening Not Applicable  Medication Review (RN Care Manager) Complete

## 2022-12-20 NOTE — Discharge Summary (Signed)
Patient ID: Tanner [name] MRN: [id number] DOB/AGE: Feb 08, 1946 77 y.o.  Admit date: 12/15/2022 Discharge date: 12/16/2022  Admission Diagnoses:  Right knee osteoarthritis  Discharge Diagnoses:  Principal Problem:   S/P total knee arthroplasty, right   Past Medical History:  Diagnosis Date   Acute kidney failure (HCC)    Anxiety    Arthritis    Back pain    BPH (benign prostatic hypertrophy)    CHF (congestive heart failure) (HCC)    Clostridium difficile infection    Clotting disorder (HCC)    Depression    Diabetes (HCC) 12/11/2016   type 2    DVT (deep venous thrombosis) (HCC)    DVT of deep femoral vein, right (HCC) 06/29/2018   Edema, lower extremity    High cholesterol    Hypertension    Joint pain    Low back pain potentially associated with spinal stenosis    Neuromuscular disorder (HCC)    Neuropathy of lower extremity    bilateral   OSA (obstructive sleep apnea)    pt denies    Osteoarthritis    PE (pulmonary embolism)    Pneumonia    hx of x 2    Ventral hernia     Surgeries: Procedure(s): TOTAL KNEE ARTHROPLASTY on 12/15/2022   Consultants:   Discharged Condition: Improved  Hospital Course: Tanner [name] is an 77 y.o. male who was admitted 12/15/2022 for operative treatment ofS/P total knee arthroplasty, right. Patient has severe unremitting pain that affects sleep, daily activities, and work/hobbies. After pre-op clearance the patient was taken to the operating room on 12/15/2022 and underwent  Procedure(s): TOTAL KNEE ARTHROPLASTY.    Patient was given perioperative antibiotics:  Anti-infectives (From admission, onward)    Start     Dose/Rate Route Frequency Ordered Stop   12/16/22 0000  cefadroxil (DURICEF) 500 MG capsule        500 mg Oral 2 times daily 12/16/22 1006 12/23/22 2359   12/15/22 2000  ceFAZolin (ANCEF) IVPB 2g/100 mL premix        2 g 200 mL/hr over 30 Minutes Intravenous Every 6 hours 12/15/22 1713 12/16/22 0725   12/15/22 1145   ceFAZolin (ANCEF) IVPB 2g/100 mL premix        2 g 200 mL/hr over 30 Minutes Intravenous On call to O.R. 12/15/22 1142 12/15/22 1433        Patient was given sequential compression devices, early ambulation, and chemoprophylaxis to prevent DVT. Patient worked with PT and was meeting their goals regarding safe ambulation and transfers.  Patient benefited maximally from hospital stay and there were no complications.    Recent vital signs: No data found.   Recent laboratory studies: No results for input(s): "WBC", "HGB", "HCT", "PLT", "NA", "K", "CL", "CO2", "BUN", "CREATININE", "GLUCOSE", "INR", "CALCIUM" in the last 72 hours.  Invalid input(s): "PT", "2"   Discharge Medications:   Allergies as of 12/16/2022       Reactions   Lisinopril Other (See Comments)    Renal impairment   Amlodipine Swelling   Codeine Sulfate Itching, Nausea Only        Medication List     TAKE these medications    acetaminophen 500 MG tablet Commonly known as: TYLENOL Take 1,000 mg by mouth every 6 (six) hours as needed for moderate pain.   acidophilus Caps capsule Take 1 capsule by mouth daily.   amiodarone 200 MG tablet Commonly known as: PACERONE Take 1 tablet (200 mg total) by mouth  daily.   atorvastatin 80 MG tablet Commonly known as: LIPITOR Take 1 tablet (80 mg total) by mouth daily.   buPROPion 150 MG 12 hr tablet Commonly known as: WELLBUTRIN SR Take 150 mg by mouth 2 (two) times daily.   carvedilol 6.25 MG tablet Commonly known as: COREG Take 1 tablet (6.25 mg total) by mouth 2 (two) times daily with a meal.   cefadroxil 500 MG capsule Commonly known as: DURICEF Take 1 capsule (500 mg total) by mouth 2 (two) times daily for 7 days.   clobetasol ointment 0.05 % Commonly known as: TEMOVATE Apply 1 Application topically daily as needed (irritation).   Eliquis 5 MG Tabs tablet Generic drug: apixaban Take 5 mg by mouth 2 (two) times daily.   empagliflozin 10 MG Tabs  tablet Commonly known as: JARDIANCE Take 1 tablet (10 mg total) by mouth daily.   finasteride 5 MG tablet Commonly known as: PROSCAR Take 5 mg by mouth at bedtime.   gabapentin 300 MG capsule Commonly known as: NEURONTIN Take 300-600 mg by mouth See admin instructions. Take 600 mg by mouth in the morning, afternoon and evening and take 300 mg at night   lidocaine 2 % solution Commonly known as: XYLOCAINE Use as directed 15 mLs in the mouth or throat as needed for mouth pain.   methocarbamol 500 MG tablet Commonly known as: ROBAXIN Take 1 tablet (500 mg total) by mouth every 6 (six) hours as needed for muscle spasms.   multivitamins ther. w/minerals Tabs tablet Take 1 tablet by mouth daily.   nortriptyline 10 MG capsule Commonly known as: PAMELOR 2 CAPSULES EVERY NIGHT AT BEDTIME   oxyCODONE 5 MG immediate release tablet Commonly known as: Oxy IR/ROXICODONE Take 1 tablet (5 mg total) by mouth every 4 (four) hours as needed for severe pain.   polyethylene glycol 17 g packet Commonly known as: MIRALAX / GLYCOLAX Take 17 g by mouth 2 (two) times daily.   saccharomyces boulardii 250 MG capsule Commonly known as: FLORASTOR Take 1 capsule (250 mg total) by mouth 2 (two) times daily.   Semaglutide (1 MG/DOSE) 4 MG/3ML Sopn Inject 1 mg as directed once a week.   Semaglutide (2 MG/DOSE) 8 MG/3ML Sopn Inject 2 mg as directed once a week.   senna 8.6 MG Tabs tablet Commonly known as: SENOKOT Take 2 tablets (17.2 mg total) by mouth at bedtime for 14 days.   sildenafil 100 MG tablet Commonly known as: VIAGRA Take 100 mg by mouth as needed for erectile dysfunction.   Thera-Gesic Plus Crea Apply 1 application topically at bedtime. Apply to lower back   torsemide 20 MG tablet Commonly known as: DEMADEX Take 1 tablet (20 mg total) by mouth daily.               Discharge Care Instructions  (From admission, onward)           Start     Ordered   12/16/22 0000   Change dressing       Comments: Maintain surgical dressing until follow up in the clinic. If the edges start to pull up, may reinforce with tape. If the dressing is no longer working, may remove and cover with gauze and tape, but must keep the area dry and clean.  Call with any questions or concerns.   12/16/22 1006            Diagnostic Studies: No results found.  Disposition: Discharge disposition: 01-Home or Self Care  Discharge Instructions     Call MD / Call 911   Complete by: As directed    If you experience chest pain or shortness of breath, CALL 911 and be transported to the hospital emergency room.  If you develope a fever above 101 F, pus (white drainage) or increased drainage or redness at the wound, or calf pain, call your surgeon's office.   Change dressing   Complete by: As directed    Maintain surgical dressing until follow up in the clinic. If the edges start to pull up, may reinforce with tape. If the dressing is no longer working, may remove and cover with gauze and tape, but must keep the area dry and clean.  Call with any questions or concerns.   Constipation Prevention   Complete by: As directed    Drink plenty of fluids.  Prune juice may be helpful.  You may use a stool softener, such as Colace (over the counter) 100 mg twice a day.  Use MiraLax (over the counter) for constipation as needed.   Diet - low sodium heart healthy   Complete by: As directed    Increase activity slowly as tolerated   Complete by: As directed    Weight bearing as tolerated with assist device (walker, cane, etc) as directed, use it as long as suggested by your surgeon or therapist, typically at least 4-6 weeks.   Post-operative opioid taper instructions:   Complete by: As directed    POST-OPERATIVE OPIOID TAPER INSTRUCTIONS: It is important to wean off of your opioid medication as soon as possible. If you do not need pain medication after your surgery it is ok to stop day  one. Opioids include: Codeine, Hydrocodone(Norco, Vicodin), Oxycodone(Percocet, oxycontin) and hydromorphone amongst others.  Long term and even short term use of opiods can cause: Increased pain response Dependence Constipation Depression Respiratory depression And more.  Withdrawal symptoms can include Flu like symptoms Nausea, vomiting And more Techniques to manage these symptoms Hydrate well Eat regular healthy meals Stay active Use relaxation techniques(deep breathing, meditating, yoga) Do Not substitute Alcohol to help with tapering If you have been on opioids for less than two weeks and do not have pain than it is ok to stop all together.  Plan to wean off of opioids This plan should start within one week post op of your joint replacement. Maintain the same interval or time between taking each dose and first decrease the dose.  Cut the total daily intake of opioids by one tablet each day Next start to increase the time between doses. The last dose that should be eliminated is the evening dose.      TED hose   Complete by: As directed    Use stockings (TED hose) for 2 weeks on both leg(s).  You may remove them at night for sleeping.        Follow-up Information     Durene Romans, MD. Schedule an appointment as soon as possible for a visit in 2 week(s).   Specialty: Orthopedic Surgery Contact information: [address] [place] [address] [phone]                  Signed: Cassandria Anger 12/20/2022, 10:15 AM

## 2022-12-21 ENCOUNTER — Encounter: Payer: Self-pay | Admitting: Physical Therapy

## 2022-12-21 ENCOUNTER — Ambulatory Visit: Payer: Medicare HMO | Attending: Student | Admitting: Physical Therapy

## 2022-12-21 DIAGNOSIS — Z96651 Presence of right artificial knee joint: Secondary | ICD-10-CM | POA: Diagnosis not present

## 2022-12-21 DIAGNOSIS — M25661 Stiffness of right knee, not elsewhere classified: Secondary | ICD-10-CM | POA: Diagnosis not present

## 2022-12-21 DIAGNOSIS — M6281 Muscle weakness (generalized): Secondary | ICD-10-CM | POA: Insufficient documentation

## 2022-12-21 DIAGNOSIS — R262 Difficulty in walking, not elsewhere classified: Secondary | ICD-10-CM | POA: Insufficient documentation

## 2022-12-21 NOTE — Therapy (Signed)
OUTPATIENT PHYSICAL THERAPY LOWER EXTREMITY EVALUATION   Patient Name: Tanner [name] MRN: [id number] DOB:[birth year], 77 y.o., male Today's Date: 12/21/2022  END OF SESSION:  PT End of Session - 12/21/22 1459     Visit Number 1    Date for PT Re-Evaluation 03/15/23    PT Start Time 1458    PT Stop Time 1536    PT Time Calculation (min) 38 min    Activity Tolerance Patient tolerated treatment well    Behavior During Therapy WFL for tasks assessed/performed             Past Medical History:  Diagnosis Date   Acute kidney failure (HCC)    Anxiety    Arthritis    Back pain    BPH (benign prostatic hypertrophy)    CHF (congestive heart failure) (HCC)    Clostridium difficile infection    Clotting disorder (HCC)    Depression    Diabetes (HCC) 12/11/2016   type 2    DVT (deep venous thrombosis) (HCC)    DVT of deep femoral vein, right (HCC) 06/29/2018   Edema, lower extremity    High cholesterol    Hypertension    Joint pain    Low back pain potentially associated with spinal stenosis    Neuromuscular disorder (HCC)    Neuropathy of lower extremity    bilateral   OSA (obstructive sleep apnea)    pt denies    Osteoarthritis    PE (pulmonary embolism)    Pneumonia    hx of x 2    Ventral hernia    Past Surgical History:  Procedure Laterality Date   CARDIOVERSION N/A 08/19/2022   Procedure: CARDIOVERSION;  Surgeon: Meriam Sprague, MD;  Location: [facility] ENDOSCOPY;  Service: Cardiovascular;  Laterality: N/A;   EYE SURGERY     HERNIA REPAIR  1999   TOTAL KNEE ARTHROPLASTY Left 08/26/2020   Procedure: LEFT TOTAL KNEE ARTHROPLASTY;  Surgeon: Gean Birchwood, MD;  Location: [facility] ORS;  Service: Orthopedics;  Laterality: Left;   TOTAL KNEE ARTHROPLASTY Right 12/15/2022   Procedure: TOTAL KNEE ARTHROPLASTY;  Surgeon: Durene Romans, MD;  Location: [facility] ORS;  Service: Orthopedics;  Laterality: Right;   Patient Active Problem List   Diagnosis Date Noted   S/P total knee  arthroplasty, right 12/15/2022   Acute respiratory failure with hypoxemia (HCC) 10/22/2022   Class 2 obesity 08/19/2022   Paroxysmal atrial fibrillation (HCC) 08/19/2022   Jaw pain 06/24/2022   Left hand pain 05/29/2022   Chronic systolic heart failure (HCC) 02/23/2022   Moderate mitral regurgitation 02/23/2022   Leg swelling 01/29/2022   ED (erectile dysfunction) 01/13/2021   Cyst, dermoid, scalp and neck 01/13/2021   Chronic kidney disease (CKD), stage III (moderate) (HCC) 12/13/2019   Carpal tunnel syndrome 12/05/2019   Elevated creatine kinase 11/15/2019   Aortic atherosclerosis (HCC) 11/15/2019   Recurrent Clostridium difficile diarrhea 05/28/2017   Peripheral neuropathy 03/17/2017   Diabetes (HCC) 12/11/2016   Allergic rhinitis 12/11/2016   Degenerative arthritis of left knee 01/30/2016   Morbid obesity (HCC) 01/30/2016   History of pulmonary embolus (PE) 12/19/2013   Anxiety state 03/29/2007   Depression 03/29/2007   OSA (obstructive sleep apnea) 03/29/2007   Unspecified glaucoma 03/29/2007   BPH (benign prostatic hyperplasia) 03/29/2007   OA (osteoarthritis) of knee 03/29/2007   Essential hypertension 03/29/2007   History of Bell's palsy 03/29/2007    PCP: Hillard Danker a, MD  REFERRING PROVIDER: Durene Romans, MD  REFERRING DIAG:  Diagnosis  Z96.659 (ICD-10-CM) - S/P total knee arthroplasty    THERAPY DIAG:  Difficulty in walking, not elsewhere classified  Muscle weakness (generalized)  Stiffness of right knee, not elsewhere classified  Status post total right knee replacement  Rationale for Evaluation and Treatment: Rehabilitation  ONSET DATE: 12/16/22  SUBJECTIVE:   SUBJECTIVE STATEMENT: Patient reports that he has been home since surgery. His son has stayed with since the surgery, but he is leaving after tonight. He feels confident that he can manage on his own.  PERTINENT HISTORY: 77 yo male s/p R TKA on 12/15/22. PMH: PE, HTN, DVT, DM,  depression, anxiety, OA, L TKA 2022   PAIN:  Are you having pain? Yes: NPRS scale: 7/10 Pain location: R knee and R quad Pain description: sharp, lingering. Aggravating factors: Standing up Relieving factors: rest  PRECAUTIONS: None  WEIGHT BEARING RESTRICTIONS: Yes WBAT  FALLS:  Has patient fallen in last 6 months? Yes. Number of falls several- he tripped once and thinks the last one was caused by his knee giving way.  LIVING ENVIRONMENT: Lives with: lives alone Lives in: House/apartment Stairs: Yes: External: 4 steps; on right going up Has following equipment at home: Single point cane and Walker - 2 wheeled  OCCUPATION: Retired, plays golf  PLOF: Independent with household mobility with device  PATIENT GOALS: Play golf, work with [employer]  NEXT MD VISIT: July 18th  OBJECTIVE:   DIAGNOSTIC FINDINGS: N/A  PATIENT SURVEYS:  FOTO 42  COGNITION: Overall cognitive status: Within functional limits for tasks assessed     SENSATION: Has PN in B hands and feet  EDEMA:  R LE edema noted  POSTURE: rounded shoulders, forward head, flexed trunk , and weight shift left  PALPATION: TTP R quad  LOWER EXTREMITY ROM: WFL R hip and ankle, L hip, knee, ankle  Active ROM Right eval Left eval  Hip flexion    Hip extension    Hip abduction    Hip adduction    Hip internal rotation    Hip external rotation    Knee flexion 87   Knee extension 11   Ankle dorsiflexion    Ankle plantarflexion    Ankle inversion    Ankle eversion     (Blank rows = not tested)  LOWER EXTREMITY MMT: LLE 4/5, R knee 3+/5, HIP and ankle 4-/5   FUNCTIONAL TESTS:  5 times sit to stand: TBD Timed up and go (TUG): TBD  GAIT: Distance walked: In clinic distances Assistive device utilized: Walker - 2 wheeled Level of assistance: Modified independence Comments: Decreased WB, decreased stance on R, flexed posture   TODAY'S TREATMENT:                                                                                                                               DATE:  Education   PATIENT EDUCATION:  Education details: POC, HEP Person educated: Patient Education method: Medical illustrator  Education comprehension: verbalized understanding and returned demonstration  HOME EXERCISE PROGRAM: [email or web address]  ASSESSMENT:  CLINICAL IMPRESSION: Patient is a 77 y.o. who was seen today for physical therapy evaluation and treatment for R TKR. He is doing very well with his current ROM and strength. Initiated HEP to continue to increase both. Will benefit from PT to progress hime through his rehab, including the strength and ROM as well as functional mobility re-education to maximize safety and I with all functional mobility to return to PLOF.  OBJECTIVE IMPAIRMENTS: Abnormal gait, decreased activity tolerance, decreased balance, decreased endurance, decreased mobility, difficulty walking, decreased ROM, decreased strength, impaired flexibility, improper body mechanics, and pain.   ACTIVITY LIMITATIONS: lifting, bending, sitting, squatting, sleeping, stairs, and locomotion level  PARTICIPATION LIMITATIONS: meal prep, cleaning, laundry, driving, shopping, and community activity  PERSONAL FACTORS: Past/current experiences are also affecting patient's functional outcome.   REHAB POTENTIAL: Good  CLINICAL DECISION MAKING: Evolving/moderate complexity  EVALUATION COMPLEXITY: Moderate   GOALS: Goals reviewed with patient? Yes  SHORT TERM GOALS: Target date: 01/02/23 I with basic HEP Baseline: Goal status: INITIAL  LONG TERM GOALS: Target date: 03/15/23  I with final HEP Baseline:  Goal status: INITIAL  2.  Complete 5 x STS in < 12 sec Baseline: TBD Goal status: INITIAL  3.  Complete Tug in < 12 sec, LRAD Baseline: TBD Goal status: INITIAL  4.  Increase R knee ROM from 0-120 Baseline: 11-87 AROM Goal status: INITIAL  5.  Ambulation x at least 400' on level  and unlevel surfaces, MI, with LRAD, equal WB through B LE. Baseline: 80', RW, slow, Decreased WB through RLE Goal status: INITIAL  6.  Increase R knee strength to at least 4+/5 Baseline: 3+/5 Goal status: INITIAL   PLAN:  PT FREQUENCY: 3x/week  PT DURATION: 12 weeks  PLANNED INTERVENTIONS: Therapeutic exercises, Therapeutic activity, Neuromuscular re-education, Balance training, Gait training, Patient/Family education, Self Care, Joint mobilization, Stair training, Cryotherapy, Moist heat, Vasopneumatic device, and Manual therapy  PLAN FOR NEXT SESSION: Update Hep, strength and ROM training.   Iona Beard, DPT 12/21/2022, 4:38 PM

## 2022-12-23 ENCOUNTER — Encounter: Payer: Self-pay | Admitting: Physical Therapy

## 2022-12-23 ENCOUNTER — Ambulatory Visit: Payer: Medicare HMO | Admitting: Physical Therapy

## 2022-12-23 DIAGNOSIS — Z96651 Presence of right artificial knee joint: Secondary | ICD-10-CM | POA: Diagnosis not present

## 2022-12-23 DIAGNOSIS — M6281 Muscle weakness (generalized): Secondary | ICD-10-CM

## 2022-12-23 DIAGNOSIS — R262 Difficulty in walking, not elsewhere classified: Secondary | ICD-10-CM

## 2022-12-23 DIAGNOSIS — M25661 Stiffness of right knee, not elsewhere classified: Secondary | ICD-10-CM

## 2022-12-23 NOTE — Therapy (Signed)
OUTPATIENT PHYSICAL THERAPY LOWER EXTREMITY EVALUATION   Patient Name: Tanner [name] MRN: [id number] DOB:05-31-1946, 77 y.o., male Today's Date: 12/23/2022  END OF SESSION:  PT End of Session - 12/23/22 1556     Visit Number 2    Date for PT Re-Evaluation 03/15/23    PT Start Time 1600    PT Stop Time 1645    PT Time Calculation (min) 45 min    Activity Tolerance Patient tolerated treatment well    Behavior During Therapy WFL for tasks assessed/performed             Past Medical History:  Diagnosis Date   Acute kidney failure (HCC)    Anxiety    Arthritis    Back pain    BPH (benign prostatic hypertrophy)    CHF (congestive heart failure) (HCC)    Clostridium difficile infection    Clotting disorder (HCC)    Depression    Diabetes (HCC) 12/11/2016   type 2    DVT (deep venous thrombosis) (HCC)    DVT of deep femoral vein, right (HCC) 06/29/2018   Edema, lower extremity    High cholesterol    Hypertension    Joint pain    Low back pain potentially associated with spinal stenosis    Neuromuscular disorder (HCC)    Neuropathy of lower extremity    bilateral   OSA (obstructive sleep apnea)    pt denies    Osteoarthritis    PE (pulmonary embolism)    Pneumonia    hx of x 2    Ventral hernia    Past Surgical History:  Procedure Laterality Date   CARDIOVERSION N/A 08/19/2022   Procedure: CARDIOVERSION;  Surgeon: Meriam Sprague, MD;  Location: [facility] ENDOSCOPY;  Service: Cardiovascular;  Laterality: N/A;   EYE SURGERY     HERNIA REPAIR  1999   TOTAL KNEE ARTHROPLASTY Left 08/26/2020   Procedure: LEFT TOTAL KNEE ARTHROPLASTY;  Surgeon: Gean Birchwood, MD;  Location: [facility];  Service: Orthopedics;  Laterality: Left;   TOTAL KNEE ARTHROPLASTY Right 12/15/2022   Procedure: TOTAL KNEE ARTHROPLASTY;  Surgeon: Durene Romans, MD;  Location: [facility];  Service: Orthopedics;  Laterality: Right;   Patient Active Problem List   Diagnosis Date Noted   S/P total knee  arthroplasty, right 12/15/2022   Acute respiratory failure with hypoxemia (HCC) 10/22/2022   Class 2 obesity 08/19/2022   Paroxysmal atrial fibrillation (HCC) 08/19/2022   Jaw pain 06/24/2022   Left hand pain 05/29/2022   Chronic systolic heart failure (HCC) 02/23/2022   Moderate mitral regurgitation 02/23/2022   Leg swelling 01/29/2022   ED (erectile dysfunction) 01/13/2021   Cyst, dermoid, scalp and neck 01/13/2021   Chronic kidney disease (CKD), stage III (moderate) (HCC) 12/13/2019   Carpal tunnel syndrome 12/05/2019   Elevated creatine kinase 11/15/2019   Aortic atherosclerosis (HCC) 11/15/2019   Recurrent Clostridium difficile diarrhea 05/28/2017   Peripheral neuropathy 03/17/2017   Diabetes (HCC) 12/11/2016   Allergic rhinitis 12/11/2016   Degenerative arthritis of left knee 01/30/2016   Morbid obesity (HCC) 01/30/2016   History of pulmonary embolus (PE) 12/19/2013   Anxiety state 03/29/2007   Depression 03/29/2007   OSA (obstructive sleep apnea) 03/29/2007   Unspecified glaucoma 03/29/2007   BPH (benign prostatic hyperplasia) 03/29/2007   OA (osteoarthritis) of knee 03/29/2007   Essential hypertension 03/29/2007   History of Bell's palsy 03/29/2007    PCP: Hillard Danker a, MD  REFERRING PROVIDER: Durene Romans, MD  REFERRING DIAG:  Diagnosis  Z96.659 (ICD-10-CM) - S/P total knee arthroplasty    THERAPY DIAG:  No diagnosis found.  Rationale for Evaluation and Treatment: Rehabilitation  ONSET DATE: 12/16/22  SUBJECTIVE:   SUBJECTIVE STATEMENT: "All right nothing wonderful"   PERTINENT HISTORY: 77 yo male s/p R TKA on 12/15/22. PMH: PE, HTN, DVT, DM, depression, anxiety, OA, L TKA 2022   PAIN:  Are you having pain? Yes: NPRS scale: 5/10 Pain location: R knee and R quad Pain description: sharp, lingering. Aggravating factors: Standing up Relieving factors: rest  PRECAUTIONS: None  WEIGHT BEARING RESTRICTIONS: Yes WBAT  FALLS:  Has patient  fallen in last 6 months? Yes. Number of falls several- he tripped once and thinks the last one was caused by his knee giving way.  LIVING ENVIRONMENT: Lives with: lives alone Lives in: House/apartment Stairs: Yes: External: 4 steps; on right going up Has following equipment at home: Single point cane and Walker - 2 wheeled  OCCUPATION: Retired, plays golf  PLOF: Independent with household mobility with device  PATIENT GOALS: Play golf, work with [employer]  NEXT MD VISIT: July 18th  OBJECTIVE:   DIAGNOSTIC FINDINGS: N/A  PATIENT SURVEYS:  FOTO 42  COGNITION: Overall cognitive status: Within functional limits for tasks assessed     SENSATION: Has PN in B hands and feet  EDEMA:  R LE edema noted  POSTURE: rounded shoulders, forward head, flexed trunk , and weight shift left  PALPATION: TTP R quad  LOWER EXTREMITY ROM: WFL R hip and ankle, L hip, knee, ankle  Active ROM Right eval Left eval  Hip flexion    Hip extension    Hip abduction    Hip adduction    Hip internal rotation    Hip external rotation    Knee flexion 87   Knee extension 11   Ankle dorsiflexion    Ankle plantarflexion    Ankle inversion    Ankle eversion     (Blank rows = not tested)  LOWER EXTREMITY MMT: LLE 4/5, R knee 3+/5, HIP and ankle 4-/5   FUNCTIONAL TESTS:  5 times sit to stand: TBD Timed up and go (TUG): TBD  GAIT: Distance walked: In clinic distances Assistive device utilized: Environmental consultant - 2 wheeled Level of assistance: Modified independence Comments: Decreased WB, decreased stance on R, flexed posture   TODAY'S TREATMENT:                                                                                                                              DATE:  12/23/22 NuStep L5 x 6 min PROM/stretching/patellar mobs R knee Quad sets x10 LAQs 1lb RLE 2x10 HS curls green t-band RLE 2x10 STS x10; x5 Fitter presses 2 blue bands 2x15 Standing marches holding onto WC  2x10 Education   PATIENT EDUCATION:  Education details: POC, HEP Person educated: Patient Education method: Medical illustrator Education comprehension: verbalized understanding and returned demonstration  HOME EXERCISE PROGRAM: [email or web address]  ASSESSMENT:  CLINICAL IMPRESSION: Patient is a 77 y.o. who was seen today for physical therapy treatment for R TKR. His RLE is swollen compared to the LLE. He has good ROM in his R knee. Needed verbal cues for proper foot positioning during STS so he would weight distribute more onto his R. Cue to increase ROM with seated HS curls needed. He tolerated treatment well and will benefit from continued PT to improve ROM of his R knee and overall balance and strength. Pt stated that he will ice at home.  OBJECTIVE IMPAIRMENTS: Abnormal gait, decreased activity tolerance, decreased balance, decreased endurance, decreased mobility, difficulty walking, decreased ROM, decreased strength, impaired flexibility, improper body mechanics, and pain.   ACTIVITY LIMITATIONS: lifting, bending, sitting, squatting, sleeping, stairs, and locomotion level  PARTICIPATION LIMITATIONS: meal prep, cleaning, laundry, driving, shopping, and community activity  PERSONAL FACTORS: Past/current experiences are also affecting patient's functional outcome.   REHAB POTENTIAL: Good  CLINICAL DECISION MAKING: Evolving/moderate complexity  EVALUATION COMPLEXITY: Moderate   GOALS: Goals reviewed with patient? Yes  SHORT TERM GOALS: Target date: 01/02/23 I with basic HEP Baseline: Goal status: INITIAL  LONG TERM GOALS: Target date: 03/15/23  I with final HEP Baseline:  Goal status: INITIAL  2.  Complete 5 x STS in < 12 sec Baseline: TBD Goal status: INITIAL  3.  Complete Tug in < 12 sec, LRAD Baseline: TBD Goal status: INITIAL  4.  Increase R knee ROM from 0-120 Baseline: 11-87 AROM Goal status: INITIAL  5.  Ambulation x at least 400' on level and unlevel  surfaces, MI, with LRAD, equal WB through B LE. Baseline: 80', RW, slow, Decreased WB through RLE Goal status: INITIAL  6.  Increase R knee strength to at least 4+/5 Baseline: 3+/5 Goal status: INITIAL   PLAN:  PT FREQUENCY: 3x/week  PT DURATION: 12 weeks  PLANNED INTERVENTIONS: Therapeutic exercises, Therapeutic activity, Neuromuscular re-education, Balance training, Gait training, Patient/Family education, Self Care, Joint mobilization, Stair training, Cryotherapy, Moist heat, Vasopneumatic device, and Manual therapy  PLAN FOR NEXT SESSION: continue with strength and ROM training   Iona Beard, DPT 12/23/2022, 3:57 PM

## 2022-12-28 ENCOUNTER — Encounter: Payer: Self-pay | Admitting: Physical Therapy

## 2022-12-28 ENCOUNTER — Ambulatory Visit: Payer: Medicare HMO | Admitting: Physical Therapy

## 2022-12-28 DIAGNOSIS — M25661 Stiffness of right knee, not elsewhere classified: Secondary | ICD-10-CM

## 2022-12-28 DIAGNOSIS — M6281 Muscle weakness (generalized): Secondary | ICD-10-CM | POA: Diagnosis not present

## 2022-12-28 DIAGNOSIS — Z96651 Presence of right artificial knee joint: Secondary | ICD-10-CM

## 2022-12-28 DIAGNOSIS — R262 Difficulty in walking, not elsewhere classified: Secondary | ICD-10-CM | POA: Diagnosis not present

## 2022-12-28 NOTE — Therapy (Signed)
OUTPATIENT PHYSICAL THERAPY LOWER EXTREMITY EVALUATION   Patient Name: Tanner [name] MRN: [id number] DOB:01/21/1946, 77 y.o., male Today's Date: 12/28/2022  END OF SESSION:  PT End of Session - 12/28/22 1451     Visit Number 3    Date for PT Re-Evaluation 03/15/23    PT Start Time 1450    PT Stop Time 1535    PT Time Calculation (min) 45 min    Activity Tolerance Patient tolerated treatment well    Behavior During Therapy WFL for tasks assessed/performed              Past Medical History:  Diagnosis Date   Acute kidney failure (HCC)    Anxiety    Arthritis    Back pain    BPH (benign prostatic hypertrophy)    CHF (congestive heart failure) (HCC)    Clostridium difficile infection    Clotting disorder (HCC)    Depression    Diabetes (HCC) 12/11/2016   type 2    DVT (deep venous thrombosis) (HCC)    DVT of deep femoral vein, right (HCC) 06/29/2018   Edema, lower extremity    High cholesterol    Hypertension    Joint pain    Low back pain potentially associated with spinal stenosis    Neuromuscular disorder (HCC)    Neuropathy of lower extremity    bilateral   OSA (obstructive sleep apnea)    pt denies    Osteoarthritis    PE (pulmonary embolism)    Pneumonia    hx of x 2    Ventral hernia    Past Surgical History:  Procedure Laterality Date   CARDIOVERSION N/A 08/19/2022   Procedure: CARDIOVERSION;  Surgeon: Meriam Sprague, MD;  Location: [facility] ENDOSCOPY;  Service: Cardiovascular;  Laterality: N/A;   EYE SURGERY     HERNIA REPAIR  1999   TOTAL KNEE ARTHROPLASTY Left 08/26/2020   Procedure: LEFT TOTAL KNEE ARTHROPLASTY;  Surgeon: Gean Birchwood, MD;  Location: [facility];  Service: Orthopedics;  Laterality: Left;   TOTAL KNEE ARTHROPLASTY Right 12/15/2022   Procedure: TOTAL KNEE ARTHROPLASTY;  Surgeon: Durene Romans, MD;  Location: [facility];  Service: Orthopedics;  Laterality: Right;   Patient Active Problem List   Diagnosis Date Noted   S/P total knee  arthroplasty, right 12/15/2022   Acute respiratory failure with hypoxemia (HCC) 10/22/2022   Class 2 obesity 08/19/2022   Paroxysmal atrial fibrillation (HCC) 08/19/2022   Jaw pain 06/24/2022   Left hand pain 05/29/2022   Chronic systolic heart failure (HCC) 02/23/2022   Moderate mitral regurgitation 02/23/2022   Leg swelling 01/29/2022   ED (erectile dysfunction) 01/13/2021   Cyst, dermoid, scalp and neck 01/13/2021   Chronic kidney disease (CKD), stage III (moderate) (HCC) 12/13/2019   Carpal tunnel syndrome 12/05/2019   Elevated creatine kinase 11/15/2019   Aortic atherosclerosis (HCC) 11/15/2019   Recurrent Clostridium difficile diarrhea 05/28/2017   Peripheral neuropathy 03/17/2017   Diabetes (HCC) 12/11/2016   Allergic rhinitis 12/11/2016   Degenerative arthritis of left knee 01/30/2016   Morbid obesity (HCC) 01/30/2016   History of pulmonary embolus (PE) 12/19/2013   Anxiety state 03/29/2007   Depression 03/29/2007   OSA (obstructive sleep apnea) 03/29/2007   Unspecified glaucoma 03/29/2007   BPH (benign prostatic hyperplasia) 03/29/2007   OA (osteoarthritis) of knee 03/29/2007   Essential hypertension 03/29/2007   History of Bell's palsy 03/29/2007    PCP: Hillard Danker a, MD  REFERRING PROVIDER: Durene Romans, MD  REFERRING DIAG:  Diagnosis  Z96.659 (ICD-10-CM) - S/P total knee arthroplasty    THERAPY DIAG:  Muscle weakness (generalized)  Stiffness of right knee, not elsewhere classified  Difficulty in walking, not elsewhere classified  Status post total right knee replacement  Rationale for Evaluation and Treatment: Rehabilitation  ONSET DATE: 12/16/22  SUBJECTIVE:   SUBJECTIVE STATEMENT: Patient reports bleeding from lower incision. He had to change his bandage. Called his Dr who was not too concerned. He returns to surgeon some time this week.  PERTINENT HISTORY: 77 yo male s/p R TKA on 12/15/22. PMH: PE, HTN, DVT, DM, depression, anxiety, OA,  L TKA 2022   PAIN:  Are you having pain? Yes: NPRS scale: 5/10 Pain location: R knee and R quad Pain description: sharp, lingering. Aggravating factors: Standing up Relieving factors: rest  PRECAUTIONS: None  WEIGHT BEARING RESTRICTIONS: Yes WBAT  FALLS:  Has patient fallen in last 6 months? Yes. Number of falls several- he tripped once and thinks the last one was caused by his knee giving way.  LIVING ENVIRONMENT: Lives with: lives alone Lives in: House/apartment Stairs: Yes: External: 4 steps; on right going up Has following equipment at home: Single point cane and Walker - 2 wheeled  OCCUPATION: Retired, plays golf  PLOF: Independent with household mobility with device  PATIENT GOALS: Play golf, work with [employer]  NEXT MD VISIT: July 18th  OBJECTIVE:   DIAGNOSTIC FINDINGS: N/A  PATIENT SURVEYS:  FOTO 42  COGNITION: Overall cognitive status: Within functional limits for tasks assessed     SENSATION: Has PN in B hands and feet  EDEMA:  R LE edema noted  POSTURE: rounded shoulders, forward head, flexed trunk , and weight shift left  PALPATION: TTP R quad  LOWER EXTREMITY ROM: WFL R hip and ankle, L hip, knee, ankle  Active ROM Right eval Left eval  Hip flexion    Hip extension    Hip abduction    Hip adduction    Hip internal rotation    Hip external rotation    Knee flexion 87   Knee extension 11   Ankle dorsiflexion    Ankle plantarflexion    Ankle inversion    Ankle eversion     (Blank rows = not tested)  LOWER EXTREMITY MMT: LLE 4/5, R knee 3+/5, HIP and ankle 4-/5   FUNCTIONAL TESTS:  5 times sit to stand: TBD Timed up and go (TUG): TBD  GAIT: Distance walked: In clinic distances Assistive device utilized: Walker - 2 wheeled Level of assistance: Modified independence Comments: Decreased WB, decreased stance on R, flexed posture   TODAY'S TREATMENT:                                                                                                                               DATE:  12/28/22 NuStep L5 x 6 minutes Supine RLE strengthening-knee press, SAQ, SLR, HS x 10 each Seated LAQ, active knee flex against G  tband x 10 each AAROM for R knee flexion R kn [id number] degrees Step ups onto 4" step, leading with RLE x 10, BUE support on RW, encouraged to minimize UE support. Ambulation with RW x 130', VC to stand up and avoid WB through arms  12/23/22 NuStep L5 x 6 min PROM/stretching/patellar mobs R knee Quad sets x10 LAQs 1lb RLE 2x10 HS curls green t-band RLE 2x10 STS x10; x5 Fitter presses 2 blue bands 2x15 Standing marches holding onto WC 2x10 Education   PATIENT EDUCATION:  Education details: POC, HEP Person educated: Patient Education method: Medical illustrator Education comprehension: verbalized understanding and returned demonstration  HOME EXERCISE PROGRAM: [id number]  ASSESSMENT:  CLINICAL IMPRESSION: Patient had to change his bandage due to bleeding from incision. Leg remains swollen, has area of redness R Lat/Distal to knee. Heshowed progress in both ROM and strength/stability of the knee, does not C/O excessive pain. Called the DR re the bleeding and the Dr was not too concerned. Educated to monitor for increased pain, swelling, or bleeding, but encouraged to continue his HEP. Has DR app later this week.  OBJECTIVE IMPAIRMENTS: Abnormal gait, decreased activity tolerance, decreased balance, decreased endurance, decreased mobility, difficulty walking, decreased ROM, decreased strength, impaired flexibility, improper body mechanics, and pain.   ACTIVITY LIMITATIONS: lifting, bending, sitting, squatting, sleeping, stairs, and locomotion level  PARTICIPATION LIMITATIONS: meal prep, cleaning, laundry, driving, shopping, and community activity  PERSONAL FACTORS: Past/current experiences are also affecting patient's functional outcome.   REHAB POTENTIAL: Good  CLINICAL DECISION  MAKING: Evolving/moderate complexity  EVALUATION COMPLEXITY: Moderate   GOALS: Goals reviewed with patient? Yes  SHORT TERM GOALS: Target date: 01/02/23 I with basic HEP Baseline: Goal status: INITIAL  LONG TERM GOALS: Target date: 03/15/23  I with final HEP Baseline:  Goal status: INITIAL  2.  Complete 5 x STS in < 12 sec Baseline: TBD Goal status: INITIAL  3.  Complete Tug in < 12 sec, LRAD Baseline: TBD Goal status: INITIAL  4.  Increase R knee ROM from 0-120 Baseline: 11-87 AROM Goal status: INITIAL  5.  Ambulation x at least 400' on level and unlevel surfaces, MI, with LRAD, equal WB through B LE. Baseline: 80', RW, slow, Decreased WB through RLE Goal status: INITIAL  6.  Increase R knee strength to at least 4+/5 Baseline: 3+/5 Goal status: INITIAL   PLAN:  PT FREQUENCY: 3x/week  PT DURATION: 12 weeks  PLANNED INTERVENTIONS: Therapeutic exercises, Therapeutic activity, Neuromuscular re-education, Balance training, Gait training, Patient/Family education, Self Care, Joint mobilization, Stair training, Cryotherapy, Moist heat, Vasopneumatic device, and Manual therapy  PLAN FOR NEXT SESSION: continue with strength and ROM training   Iona Beard, DPT 12/28/2022, 3:41 PM

## 2022-12-30 ENCOUNTER — Ambulatory Visit: Payer: Medicare HMO

## 2022-12-30 DIAGNOSIS — M6281 Muscle weakness (generalized): Secondary | ICD-10-CM | POA: Diagnosis not present

## 2022-12-30 DIAGNOSIS — Z96651 Presence of right artificial knee joint: Secondary | ICD-10-CM

## 2022-12-30 DIAGNOSIS — R262 Difficulty in walking, not elsewhere classified: Secondary | ICD-10-CM | POA: Diagnosis not present

## 2022-12-30 DIAGNOSIS — M25661 Stiffness of right knee, not elsewhere classified: Secondary | ICD-10-CM

## 2022-12-30 NOTE — Therapy (Signed)
OUTPATIENT PHYSICAL THERAPY LOWER EXTREMITY TREATMENT   Patient Name: Tanner [name] MRN: [id number] DOB:04/04/1946, 77 y.o., male Today's Date: 12/30/2022  END OF SESSION:  PT End of Session - 12/30/22 1431     Visit Number 4    Date for PT Re-Evaluation 03/15/23    PT Start Time 1434    PT Stop Time 1515    PT Time Calculation (min) 41 min    Equipment Utilized During Treatment Gait belt    Activity Tolerance Patient tolerated treatment well    Behavior During Therapy WFL for tasks assessed/performed               Past Medical History:  Diagnosis Date   Acute kidney failure (HCC)    Anxiety    Arthritis    Back pain    BPH (benign prostatic hypertrophy)    CHF (congestive heart failure) (HCC)    Clostridium difficile infection    Clotting disorder (HCC)    Depression    Diabetes (HCC) 12/11/2016   type 2    DVT (deep venous thrombosis) (HCC)    DVT of deep femoral vein, right (HCC) 06/29/2018   Edema, lower extremity    High cholesterol    Hypertension    Joint pain    Low back pain potentially associated with spinal stenosis    Neuromuscular disorder (HCC)    Neuropathy of lower extremity    bilateral   OSA (obstructive sleep apnea)    pt denies    Osteoarthritis    PE (pulmonary embolism)    Pneumonia    hx of x 2    Ventral hernia    Past Surgical History:  Procedure Laterality Date   CARDIOVERSION N/A 08/19/2022   Procedure: CARDIOVERSION;  Surgeon: Meriam Sprague, MD;  Location: [facility] ENDOSCOPY;  Service: Cardiovascular;  Laterality: N/A;   EYE SURGERY     HERNIA REPAIR  1999   TOTAL KNEE ARTHROPLASTY Left 08/26/2020   Procedure: LEFT TOTAL KNEE ARTHROPLASTY;  Surgeon: Gean Birchwood, MD;  Location: [facility] ORS;  Service: Orthopedics;  Laterality: Left;   TOTAL KNEE ARTHROPLASTY Right 12/15/2022   Procedure: TOTAL KNEE ARTHROPLASTY;  Surgeon: Durene Romans, MD;  Location: [facility] ORS;  Service: Orthopedics;  Laterality: Right;   Patient Active Problem  List   Diagnosis Date Noted   S/P total knee arthroplasty, right 12/15/2022   Acute respiratory failure with hypoxemia (HCC) 10/22/2022   Class 2 obesity 08/19/2022   Paroxysmal atrial fibrillation (HCC) 08/19/2022   Jaw pain 06/24/2022   Left hand pain 05/29/2022   Chronic systolic heart failure (HCC) 02/23/2022   Moderate mitral regurgitation 02/23/2022   Leg swelling 01/29/2022   ED (erectile dysfunction) 01/13/2021   Cyst, dermoid, scalp and neck 01/13/2021   Chronic kidney disease (CKD), stage III (moderate) (HCC) 12/13/2019   Carpal tunnel syndrome 12/05/2019   Elevated creatine kinase 11/15/2019   Aortic atherosclerosis (HCC) 11/15/2019   Recurrent Clostridium difficile diarrhea 05/28/2017   Peripheral neuropathy 03/17/2017   Diabetes (HCC) 12/11/2016   Allergic rhinitis 12/11/2016   Degenerative arthritis of left knee 01/30/2016   Morbid obesity (HCC) 01/30/2016   History of pulmonary embolus (PE) 12/19/2013   Anxiety state 03/29/2007   Depression 03/29/2007   OSA (obstructive sleep apnea) 03/29/2007   Unspecified glaucoma 03/29/2007   BPH (benign prostatic hyperplasia) 03/29/2007   OA (osteoarthritis) of knee 03/29/2007   Essential hypertension 03/29/2007   History of Bell's palsy 03/29/2007    PCP: Hillard Danker a,  MD  REFERRING PROVIDER: Durene Romans, MD  REFERRING DIAG:  Diagnosis  9344104312 (ICD-10-CM) - S/P total knee arthroplasty    THERAPY DIAG:  Muscle weakness (generalized)  Stiffness of right knee, not elsewhere classified  Difficulty in walking, not elsewhere classified  Status post total right knee replacement  Rationale for Evaluation and Treatment: Rehabilitation  ONSET DATE: 12/16/22  SUBJECTIVE:   SUBJECTIVE STATEMENT: Patient reports bleeding from lower incision. He removed and applied a new bandage again today.  To see surgeon for follow up this am. He returns to surgeon some time this week.  PERTINENT HISTORY: 77 yo male s/p  R TKA on 12/15/22. PMH: PE, HTN, DVT, DM, depression, anxiety, OA, L TKA 2022   PAIN:  Are you having pain? Yes: NPRS scale: 5/10 Pain location: R knee and R quad Pain description: sharp, lingering. Aggravating factors: Standing up Relieving factors: rest  PRECAUTIONS: None  WEIGHT BEARING RESTRICTIONS: Yes WBAT  FALLS:  Has patient fallen in last 6 months? Yes. Number of falls several- he tripped once and thinks the last one was caused by his knee giving way.  LIVING ENVIRONMENT: Lives with: lives alone Lives in: House/apartment Stairs: Yes: External: 4 steps; on right going up Has following equipment at home: Single point cane and Walker - 2 wheeled  OCCUPATION: Retired, plays golf  PLOF: Independent with household mobility with device  PATIENT GOALS: Play golf, work with [employer]  NEXT MD VISIT: July 18th  OBJECTIVE:   DIAGNOSTIC FINDINGS: N/A  PATIENT SURVEYS:  FOTO 42  COGNITION: Overall cognitive status: Within functional limits for tasks assessed     SENSATION: Has PN in B hands and feet  EDEMA:  R LE edema noted  POSTURE: rounded shoulders, forward head, flexed trunk , and weight shift left  PALPATION: TTP R quad  LOWER EXTREMITY ROM: WFL R hip and ankle, L hip, knee, ankle  Active ROM Right eval Left eval  Hip flexion    Hip extension    Hip abduction    Hip adduction    Hip internal rotation    Hip external rotation    Knee flexion 87   Knee extension 11   Ankle dorsiflexion    Ankle plantarflexion    Ankle inversion    Ankle eversion     (Blank rows = not tested)  LOWER EXTREMITY MMT: LLE 4/5, R knee 3+/5, HIP and ankle 4-/5   FUNCTIONAL TESTS:  5 times sit to stand: TBD Timed up and go (TUG): TBD  GAIT: Distance walked: In clinic distances Assistive device utilized: Walker - 2 wheeled Level of assistance: Modified independence Comments: Decreased WB, decreased stance on R, flexed posture   TODAY'S TREATMENT:                                                                                                                               DATE: 12/30/22:therex: Nustep level  5, x 6 min Measured  R knee flexion 100, ext in supine -10 Seated long arc quads, emphasis on eccentrics, therapist assisted with concentric terminal extension Standing for terminal R knee extension against green theraband 15x Forward step ups onto 2" step within walker 15x Seated for R LE hip and knee drivers into physioball, therapist providing some resistance Supine for SLR R, 10x, measured lage 20 degrees. Good quads contraction noted Seated R ankle plantarflexion against blue theraband 20x     12/28/22 NuStep L5 x 6 minutes Supine RLE strengthening-knee press, SAQ, SLR, HS x 10 each Seated LAQ, active knee flex against G tband x 10 each AAROM for R knee flexion R kn AAROM12-98 degrees Step ups onto 4" step, leading with RLE x 10, BUE support on RW, encouraged to minimize UE support. Ambulation with RW x 130', VC to stand up and avoid WB through arms  12/23/22 NuStep L5 x 6 min PROM/stretching/patellar mobs R knee Quad sets x10 LAQs 1lb RLE 2x10 HS curls green t-band RLE 2x10 STS x10; x5 Fitter presses 2 blue bands 2x15 Standing marches holding onto WC 2x10 Education   PATIENT EDUCATION:  Education details: POC, HEP Person educated: Patient Education method: Medical illustrator Education comprehension: verbalized understanding and returned demonstration  HOME EXERCISE PROGRAM: [id number]  ASSESSMENT:  CLINICAL IMPRESSION: Patient had to change his bandage due to bleeding from incision again this am.  Still has some bloody drainage through new bandage.  To see surgeon tomorrow.  Not in much pain, tolerated today's Rx well. Does have flexed posture trunk, B hips, knees. Lower  Leg/ ankle remains swollen, but not warm or tender with palpation. Educated to monitor for increased pain, swelling, or bleeding, but  encouraged to continue his HEP.  Continues to benefit from skilled PT to address his deficits.  OBJECTIVE IMPAIRMENTS: Abnormal gait, decreased activity tolerance, decreased balance, decreased endurance, decreased mobility, difficulty walking, decreased ROM, decreased strength, impaired flexibility, improper body mechanics, and pain.   ACTIVITY LIMITATIONS: lifting, bending, sitting, squatting, sleeping, stairs, and locomotion level  PARTICIPATION LIMITATIONS: meal prep, cleaning, laundry, driving, shopping, and community activity  PERSONAL FACTORS: Past/current experiences are also affecting patient's functional outcome.   REHAB POTENTIAL: Good  CLINICAL DECISION MAKING: Evolving/moderate complexity  EVALUATION COMPLEXITY: Moderate   GOALS: Goals reviewed with patient? Yes  SHORT TERM GOALS: Target date: 01/02/23 I with basic HEP Baseline: Goal status: INITIAL  LONG TERM GOALS: Target date: 03/15/23  I with final HEP Baseline:  Goal status: INITIAL  2.  Complete 5 x STS in < 12 sec Baseline: TBD Goal status: INITIAL  3.  Complete Tug in < 12 sec, LRAD Baseline: TBD Goal status: INITIAL  4.  Increase R knee ROM from 0-120 Baseline: 11-87 AROM Goal status: INITIAL  5.  Ambulation x at least 400' on level and unlevel surfaces, MI, with LRAD, equal WB through B LE. Baseline: 80', RW, slow, Decreased WB through RLE Goal status: INITIAL  6.  Increase R knee strength to at least 4+/5 Baseline: 3+/5 Goal status: INITIAL   PLAN:  PT FREQUENCY: 3x/week  PT DURATION: 12 weeks  PLANNED INTERVENTIONS: Therapeutic exercises, Therapeutic activity, Neuromuscular re-education, Balance training, Gait training, Patient/Family education, Self Care, Joint mobilization, Stair training, Cryotherapy, Moist heat, Vasopneumatic device, and Manual therapy  PLAN FOR NEXT SESSION: continue with strength and ROM training   Symphani Eckstrom, PT, DPT OCS 12/30/2022, 4:11 PM

## 2022-12-31 ENCOUNTER — Ambulatory Visit: Payer: Self-pay | Admitting: Licensed Clinical Social Worker

## 2022-12-31 NOTE — Patient Outreach (Signed)
  Care Coordination  Follow Up Visit Note   12/31/2022 Name: Tanner [name] MRN: [id number] DOB: 07/03/1945  Tanner [name] is a 77 y.o. year old male who sees Myrlene Broker, MD for primary care. I spoke with  Tanner [name] by phone today.  What matters to the patients health and wellness today?  Continued progress with post knee surgery   Patient reports making good progress with his health, declined ongoing support from RN care management at this time.   Goals Addressed             This Visit's Progress    decrease stress related to health decline       Activities and task to complete in order to accomplish goals.   Keep all upcoming appointment discussed today ( all consultations leading up to knee surgery) Continue with compliance of taking medication prescribed by Doctor Self Support options  (continue using walker, reconnect with support group at church, continue to receive support from your son)  I am glad you are making great progress with your knee        SDOH assessments and interventions completed:  Yes  SDOH Interventions Today    Flowsheet Row Most Recent Value  SDOH Interventions   Stress Interventions Provide Counseling       Care Coordination Interventions:  Yes, provided  Interventions Today    Flowsheet Row Most Recent Value  Chronic Disease   Chronic disease during today's visit Hypertension (HTN), Congestive Heart Failure (CHF), Diabetes, Chronic Kidney Disease/End Stage Renal Disease (ESRD)  General Interventions   General Interventions Discussed/Reviewed General Interventions Reviewed, Communication with  [reminded of upcoming appointments, offer to reschedule with RN care manager/ pt declined]  Communication with RN  Mental Health Interventions   Mental Health Discussed/Reviewed Mental Health Reviewed  Tanner [name] listing , emotional support provided]  Nutrition Interventions   Nutrition Discussed/Reviewed Nutrition Reviewed  Safety  Interventions   Safety Discussed/Reviewed Home Safety, Safety Reviewed       Follow up plan: Follow up call scheduled for 01/28/23    Encounter Outcome:  Pt. Visit Completed   Tanner Hines, LCSW Social Work Care Coordination  Tanner [name] Tanner [name] [employer] [phone]

## 2022-12-31 NOTE — Patient Instructions (Signed)
Social Work Visit Information  Thank you for taking time to visit with me today. Please don't hesitate to contact me if I can be of assistance to you.   Following are the goals we discussed today:   Goals Addressed             This Visit's Progress    decrease stress related to health decline       Activities and task to complete in order to accomplish goals.   Keep all upcoming appointment discussed today ( all consultations leading up to knee surgery) Continue with compliance of taking medication prescribed by Doctor Self Support options  (continue using walker, reconnect with support group at church, continue to receive support from your son)  I am glad you are making great progress with your knee         Our next appointment is by telephone on 01/28/23 at 1:15   Please call the care guide team at [phone] if you need to cancel or reschedule your appointment.   If you or anyone you know are experiencing a Mental Health or Behavioral Health Crisis or need someone to talk to, please call the Suicide and Crisis Lifeline: 988 call the [place] National Suicide Prevention Lifeline: (315) 841-8883 or TTY: 231-606-9788 TTY (807)328-4470) to talk to a trained counselor call 1-800-273-TALK (toll free, 24 hour hotline) go to [facility] [address], [place] [phone])   Patient verbalizes understanding of instructions and care plan provided today and agrees to view in MyChart. Active MyChart status and patient understanding of how to access instructions and care plan via MyChart confirmed with patient.       Sammuel Hines, LCSW Social Work Care Coordination  [facility] Emmie Niemann [employer] [phone]

## 2023-01-01 ENCOUNTER — Other Ambulatory Visit (INDEPENDENT_AMBULATORY_CARE_PROVIDER_SITE_OTHER): Payer: Self-pay

## 2023-01-01 DIAGNOSIS — Z7189 Other specified counseling: Secondary | ICD-10-CM

## 2023-01-01 NOTE — Nursing Note (Signed)
POPULATION HEALTH    COMPLEX CARE MANAGEMENT    Chronic Care Management - CCM  Status: Enrolled  Effective Dates: 12/02/2022 - present  Responsible Staff: Alden Benjamin, RN          Patient reports that he is doing well. He went on recent trip to [place]. Feels good continues to check BP 1-2 times a day . His normal range 120/130's over 70's. Patient states dizziness and lower BP is on and off. Has upcoming Cardiology appointment 8/7. Patient maintaining a healthy diet and activity. Patient did see urology recently . He is to continue Proscar and Uroxatral. Provider suggested some non invasive procedures . Patient started education on Urolift procedure. Patient still has not decided yet. Patient has Urology F/U in six months. Patient started receiving MOW. Patient awaiting [employer] [facility] contact . Patient seems happy and does not have any complaints of dizziness, SOB, or CP at this time.     Care Management Tasks Completed:  Chart review completed.  Social determinants of health reviewed and updated as needed.  Care plan reviewed/discussed/updated.  Interventions ongoing and updated as needed.  Goals addressed and updated.  Interventions ongoing and updated as needed.  Education completed.  Self management plan reviewed and updated.  See patient care coordination note.  Barriers to health, care plan, and goals reviewed.  Interventions ongoing and updated as needed.  Health maintenance/care gaps reviewed and discussed with patient.  Health maintenance/care gaps updated as applicable.  Discussed upcoming appointment dates and times.  Reinforced importance of keeping all provider appointments.  Reinforced the importance of taking all medications as prescribed.  Medical history, surgical history, hospitalizations, and medications reviewed and updated as applicable.    Ongoing and Updated Assessment Interventions:  Reviewed Urology appointment and medications  Reviewed future F/U with urology to  discuss Urolift in 6 months  Encouraged BP checking   Encouraged heart healthy diet and activity  Confirmed MOW delivery started 1 week ago   Confirmed AAA awaiting contact from [facility] office     Case Manager To Do List for the Next Interaction:  Monthly follow up  FU Cardiology appointment 8/7  F/U BP Log  F/U Urology S/S  F/U AAA      Plan to call patient in ~ one month to reassess and update plan of care.  Instructed patient to call with change in symptoms or as needed prior to next follow up.      Alden Benjamin, RN

## 2023-01-04 ENCOUNTER — Encounter: Payer: Self-pay | Admitting: Internal Medicine

## 2023-01-04 ENCOUNTER — Ambulatory Visit: Payer: Self-pay

## 2023-01-04 ENCOUNTER — Ambulatory Visit (INDEPENDENT_AMBULATORY_CARE_PROVIDER_SITE_OTHER): Payer: Medicare HMO | Admitting: Internal Medicine

## 2023-01-04 VITALS — BP 120/80 | HR 55 | Temp 97.6°F | Ht 65.5 in | Wt 232.0 lb

## 2023-01-04 DIAGNOSIS — Z0000 Encounter for general adult medical examination without abnormal findings: Secondary | ICD-10-CM | POA: Diagnosis not present

## 2023-01-04 DIAGNOSIS — G4733 Obstructive sleep apnea (adult) (pediatric): Secondary | ICD-10-CM | POA: Diagnosis not present

## 2023-01-04 DIAGNOSIS — F3342 Major depressive disorder, recurrent, in full remission: Secondary | ICD-10-CM

## 2023-01-04 DIAGNOSIS — E1169 Type 2 diabetes mellitus with other specified complication: Secondary | ICD-10-CM

## 2023-01-04 DIAGNOSIS — I700 Atherosclerosis of aorta: Secondary | ICD-10-CM

## 2023-01-04 NOTE — Progress Notes (Unsigned)
   Subjective:   Patient ID: Tanner [name], male    DOB: Jul 06, 1945, 77 y.o.   MRN: [id number]  HPI The patient is here for physical.  PMH, [facility], social history reviewed and updated  Review of Systems  Constitutional: Negative.   HENT: Negative.    Eyes: Negative.   Respiratory:  Negative for cough, chest tightness and shortness of breath.   Cardiovascular:  Negative for chest pain, palpitations and leg swelling.  Gastrointestinal:  Negative for abdominal distention, abdominal pain, constipation, diarrhea, nausea and vomiting.  Musculoskeletal:  Positive for arthralgias and gait problem.  Skin: Negative.   Neurological:  Positive for weakness and numbness.  Psychiatric/Behavioral: Negative.      Objective:  Physical Exam Constitutional:      Appearance: He is well-developed. He is obese.  HENT:     Head: Normocephalic and atraumatic.  Cardiovascular:     Rate and Rhythm: Normal rate and regular rhythm.  Pulmonary:     Effort: Pulmonary effort is normal. No respiratory distress.     Breath sounds: Normal breath sounds. No wheezing or rales.  Abdominal:     General: Bowel sounds are normal. There is no distension.     Palpations: Abdomen is soft.     Tenderness: There is no abdominal tenderness. There is no rebound.  Musculoskeletal:        General: Tenderness present.     Cervical back: Normal range of motion.  Skin:    General: Skin is warm and dry.  Neurological:     Mental Status: He is alert and oriented to person, place, and time.     Sensory: Sensory deficit present.     Coordination: Coordination abnormal.     Comments: walker     Vitals:   01/04/23 1334  BP: 120/80  Pulse: (!) 55  Temp: 97.6 F (36.4 C)  TempSrc: Oral  SpO2: 97%  Weight: 232 lb (105.2 kg)  Height: 5' 5.5" (1.664 m)    Assessment & Plan:

## 2023-01-04 NOTE — Patient Outreach (Addendum)
  Care Coordination   Closure  Visit Note   01/04/2023 Name: Tanner [name] MRN: [id number] DOB: 30-Jan-1946  Tanner [name] is a 77 y.o. year old male who sees Myrlene Broker, MD for primary care.  No patient contact was made during this encounter.  RNCM received notification per Sammuel Hines, LCSW, "Patient reports making good progress with his health, declined ongoing support from RN care management at this time".  Goals completed.  Goals Addressed             This Visit's Progress    COMPLETED: continue to improve post hospitalization       Interventions Today    Flowsheet Row Most Recent Value  General Interventions   General Interventions Discussed/Reviewed --  ["Patient reports making good progress with his health, declined ongoing support from RN care management at this time"]            SDOH assessments and interventions completed:  No  Care Coordination Interventions:  No, not indicated   Follow up plan: No further intervention required.   Encounter Outcome:  Pt. Visit Completed   Kathyrn Sheriff, RN, MSN, BSN, CCM [facility] Care Coordinator [phone]

## 2023-01-05 ENCOUNTER — Encounter: Payer: Self-pay | Admitting: Physical Therapy

## 2023-01-05 ENCOUNTER — Ambulatory Visit: Payer: Medicare HMO | Admitting: Physical Therapy

## 2023-01-05 DIAGNOSIS — Z96651 Presence of right artificial knee joint: Secondary | ICD-10-CM

## 2023-01-05 DIAGNOSIS — R262 Difficulty in walking, not elsewhere classified: Secondary | ICD-10-CM | POA: Diagnosis not present

## 2023-01-05 DIAGNOSIS — M6281 Muscle weakness (generalized): Secondary | ICD-10-CM

## 2023-01-05 DIAGNOSIS — M25661 Stiffness of right knee, not elsewhere classified: Secondary | ICD-10-CM | POA: Diagnosis not present

## 2023-01-05 NOTE — Therapy (Signed)
OUTPATIENT PHYSICAL THERAPY LOWER EXTREMITY TREATMENT   Patient Name: Tanner [name] MRN: [id number] DOB:1945-07-29, 77 y.o., male Today's Date: 01/05/2023  END OF SESSION:  PT End of Session - 01/05/23 1520     Visit Number 5    Date for PT Re-Evaluation 03/15/23    PT Start Time 1515    PT Stop Time 1600    PT Time Calculation (min) 45 min    Activity Tolerance Patient tolerated treatment well    Behavior During Therapy WFL for tasks assessed/performed               Past Medical History:  Diagnosis Date   Acute kidney failure (HCC)    Anxiety    Arthritis    Back pain    BPH (benign prostatic hypertrophy)    CHF (congestive heart failure) (HCC)    Clostridium difficile infection    Clotting disorder (HCC)    Depression    Diabetes (HCC) 12/11/2016   type 2    DVT (deep venous thrombosis) (HCC)    DVT of deep femoral vein, right (HCC) 06/29/2018   Edema, lower extremity    High cholesterol    Hypertension    Joint pain    Low back pain potentially associated with spinal stenosis    Neuromuscular disorder (HCC)    Neuropathy of lower extremity    bilateral   OSA (obstructive sleep apnea)    pt denies    Osteoarthritis    PE (pulmonary embolism)    Pneumonia    hx of x 2    Ventral hernia    Past Surgical History:  Procedure Laterality Date   CARDIOVERSION N/A 08/19/2022   Procedure: CARDIOVERSION;  Surgeon: Meriam Sprague, MD;  Location: [facility] ENDOSCOPY;  Service: Cardiovascular;  Laterality: N/A;   EYE SURGERY     HERNIA REPAIR  1999   TOTAL KNEE ARTHROPLASTY Left 08/26/2020   Procedure: LEFT TOTAL KNEE ARTHROPLASTY;  Surgeon: Gean Birchwood, MD;  Location: [facility]S;  Service: Orthopedics;  Laterality: Left;   TOTAL KNEE ARTHROPLASTY Right 12/15/2022   Procedure: TOTAL KNEE ARTHROPLASTY;  Surgeon: Durene Romans, MD;  Location: [facility];  Service: Orthopedics;  Laterality: Right;   Patient Active Problem List   Diagnosis Date Noted   S/P total knee  arthroplasty, right 12/15/2022   Acute respiratory failure with hypoxemia (HCC) 10/22/2022   Class 2 obesity 08/19/2022   Paroxysmal atrial fibrillation (HCC) 08/19/2022   Jaw pain 06/24/2022   Left hand pain 05/29/2022   Chronic systolic heart failure (HCC) 02/23/2022   Moderate mitral regurgitation 02/23/2022   Leg swelling 01/29/2022   ED (erectile dysfunction) 01/13/2021   Cyst, dermoid, scalp and neck 01/13/2021   Chronic kidney disease (CKD), stage III (moderate) (HCC) 12/13/2019   Carpal tunnel syndrome 12/05/2019   Elevated creatine kinase 11/15/2019   Aortic atherosclerosis (HCC) 11/15/2019   Recurrent Clostridium difficile diarrhea 05/28/2017   Peripheral neuropathy 03/17/2017   Diabetes (HCC) 12/11/2016   Allergic rhinitis 12/11/2016   Degenerative arthritis of left knee 01/30/2016   Morbid obesity (HCC) 01/30/2016   History of pulmonary embolus (PE) 12/19/2013   Anxiety state 03/29/2007   Depression 03/29/2007   OSA (obstructive sleep apnea) 03/29/2007   Unspecified glaucoma 03/29/2007   BPH (benign prostatic hyperplasia) 03/29/2007   OA (osteoarthritis) of knee 03/29/2007   Essential hypertension 03/29/2007   History of Bell's palsy 03/29/2007    PCP: Hillard Danker a, MD  REFERRING PROVIDER: Durene Romans, MD  REFERRING  DIAG:  Diagnosis  Z96.659 (ICD-10-CM) - S/P total knee arthroplasty    THERAPY DIAG:  Stiffness of right knee, not elsewhere classified  Difficulty in walking, not elsewhere classified  Status post total right knee replacement  Muscle weakness (generalized)  Rationale for Evaluation and Treatment: Rehabilitation  ONSET DATE: 12/16/22  SUBJECTIVE:   SUBJECTIVE STATEMENT: "Im ok"  PERTINENT HISTORY: 77 yo male s/p R TKA on 12/15/22. PMH: PE, HTN, DVT, DM, depression, anxiety, OA, L TKA 2022   PAIN:  Are you having pain? Yes: NPRS scale: 2/10 Pain location: R knee and R quad Pain description: sharp, lingering. Aggravating  factors: Standing up Relieving factors: rest  PRECAUTIONS: None  WEIGHT BEARING RESTRICTIONS: Yes WBAT  FALLS:  Has patient fallen in last 6 months? Yes. Number of falls several- he tripped once and thinks the last one was caused by his knee giving way.  LIVING ENVIRONMENT: Lives with: lives alone Lives in: House/apartment Stairs: Yes: External: 4 steps; on right going up Has following equipment at home: Single point cane and Walker - 2 wheeled  OCCUPATION: Retired, plays golf  PLOF: Independent with household mobility with device  PATIENT GOALS: Play golf, work with [employer]  NEXT MD VISIT: July 18th  OBJECTIVE:   DIAGNOSTIC FINDINGS: N/A  PATIENT SURVEYS:  FOTO 42  COGNITION: Overall cognitive status: Within functional limits for tasks assessed     SENSATION: Has PN in B hands and feet  EDEMA:  R LE edema noted  POSTURE: rounded shoulders, forward head, flexed trunk , and weight shift left  PALPATION: TTP R quad  LOWER EXTREMITY ROM: WFL R hip and ankle, L hip, knee, ankle  Active ROM Right eval Left eval  Hip flexion    Hip extension    Hip abduction    Hip adduction    Hip internal rotation    Hip external rotation    Knee flexion 87   Knee extension 11   Ankle dorsiflexion    Ankle plantarflexion    Ankle inversion    Ankle eversion     (Blank rows = not tested)  LOWER EXTREMITY MMT: LLE 4/5, R knee 3+/5, HIP and ankle 4-/5   FUNCTIONAL TESTS:  5 times sit to stand: TBD Timed up and go (TUG): TBD  GAIT: Distance walked: In clinic distances Assistive device utilized: Environmental consultant - 2 wheeled Level of assistance: Modified independence Comments: Decreased WB, decreased stance on R, flexed posture   TODAY'S TREATMENT:                                                                                                                              DATE: 01/05/23 NuStep L 5 x 6 min R knee PROM with eng range holds S2S 3x5  LAQ RLE 2x15 RLE  HS curls green 2x10 HS Curls green 2x15 Side steps length of mat table  Alt 4 in box taps with SPC 2x5 LOB x2  12/30/22:therex:  Nustep level  5, x 6 min Measured R knee flexion 100, ext in supine -10 Seated long arc quads, emphasis on eccentrics, therapist assisted with concentric terminal extension Standing for terminal R knee extension against green theraband 15x Forward step ups onto 2" step within walker 15x Seated for R LE hip and knee drivers into physioball, therapist providing some resistance Supine for SLR R, 10x, measured lage 20 degrees. Good quads contraction noted Seated R ankle plantarflexion against blue theraband 20x     12/28/22 NuStep L5 x 6 minutes Supine RLE strengthening-knee press, SAQ, SLR, HS x 10 each Seated LAQ, active knee flex against G tband x 10 each AAROM for R knee flexion R kn [id number] degrees Step ups onto 4" step, leading with RLE x 10, BUE support on RW, encouraged to minimize UE support. Ambulation with RW x 130', VC to stand up and avoid WB through arms  12/23/22 NuStep L5 x 6 min PROM/stretching/patellar mobs R knee Quad sets x10 LAQs 1lb RLE 2x10 HS curls green t-band RLE 2x10 STS x10; x5 Fitter presses 2 blue bands 2x15 Standing marches holding onto WC 2x10 Education   PATIENT EDUCATION:  Education details: POC, HEP Person educated: Patient Education method: Medical illustrator Education comprehension: verbalized understanding and returned demonstration  HOME EXERCISE PROGRAM: [email or web address]  ASSESSMENT:  CLINICAL IMPRESSION: Not in much pain, tolerated today's Rx well. Does have flexed posture trunk, B hips, knees. Lower  Leg/ ankle remains swollen, but not warm or tender with palpation. Progress with more functional interventions. Weakness present with sit to stands but gave good effort. LOB x2 with alt box taps, performed in front of the mat table so pt sat down when he lost his balance. Continues to benefit from skilled  PT to address his deficits.  OBJECTIVE IMPAIRMENTS: Abnormal gait, decreased activity tolerance, decreased balance, decreased endurance, decreased mobility, difficulty walking, decreased ROM, decreased strength, impaired flexibility, improper body mechanics, and pain.   ACTIVITY LIMITATIONS: lifting, bending, sitting, squatting, sleeping, stairs, and locomotion level  PARTICIPATION LIMITATIONS: meal prep, cleaning, laundry, driving, shopping, and community activity  PERSONAL FACTORS: Past/current experiences are also affecting patient's functional outcome.   REHAB POTENTIAL: Good  CLINICAL DECISION MAKING: Evolving/moderate complexity  EVALUATION COMPLEXITY: Moderate   GOALS: Goals reviewed with patient? Yes  SHORT TERM GOALS: Target date: 01/02/23 I with basic HEP Baseline: Goal status: INITIAL  LONG TERM GOALS: Target date: 03/15/23  I with final HEP Baseline:  Goal status: INITIAL  2.  Complete 5 x STS in < 12 sec Baseline: TBD Goal status: INITIAL  3.  Complete Tug in < 12 sec, LRAD Baseline: TBD Goal status: INITIAL  4.  Increase R knee ROM from 0-120 Baseline: 11-87 AROM Goal status: INITIAL  5.  Ambulation x at least 400' on level and unlevel surfaces, MI, with LRAD, equal WB through B LE. Baseline: 80', RW, slow, Decreased WB through RLE Goal status: INITIAL  6.  Increase R knee strength to at least 4+/5 Baseline: 3+/5 Goal status: INITIAL   PLAN:  PT FREQUENCY: 3x/week  PT DURATION: 12 weeks  PLANNED INTERVENTIONS: Therapeutic exercises, Therapeutic activity, Neuromuscular re-education, Balance training, Gait training, Patient/Family education, Self Care, Joint mobilization, Stair training, Cryotherapy, Moist heat, Vasopneumatic device, and Manual therapy  PLAN FOR NEXT SESSION: continue with strength and ROM training   Amy Speaks, PT, DPT [facility] 01/05/2023, 3:20 PM

## 2023-01-05 NOTE — Progress Notes (Signed)
Per RN notes, pt completed appt - no f/u indicated

## 2023-01-06 ENCOUNTER — Encounter: Payer: Self-pay | Admitting: Internal Medicine

## 2023-01-06 NOTE — Assessment & Plan Note (Signed)
Taking jardiance and ozempic. Recent labs stable not due for follow up today. Reminded about eye exam and foot exam up to date. On statin.

## 2023-01-06 NOTE — Assessment & Plan Note (Signed)
Flu shot yearly. Pneumonia complete. Shingrix complete. Tetanus due 2031. Colonoscopy due but due to age may not be appropriate. Counseled about sun safety and mole surveillance. Counseled about the dangers of distracted driving. Given 10 year screening recommendations.

## 2023-01-06 NOTE — Assessment & Plan Note (Signed)
Overall stable with wellbutrin 150 mg BID and pamelor 20 mg at bedtime. Continue.

## 2023-01-06 NOTE — Assessment & Plan Note (Signed)
Taking atorvastatin and will continue.

## 2023-01-06 NOTE — Assessment & Plan Note (Signed)
Using CPAP and getting benefit will continue.

## 2023-01-06 NOTE — Assessment & Plan Note (Signed)
Weight stable. Counseled on diet and exercise. BMI 38 but complicated by hypertension and diabetes.

## 2023-01-07 ENCOUNTER — Encounter: Payer: Self-pay | Admitting: Physical Therapy

## 2023-01-07 ENCOUNTER — Ambulatory Visit: Payer: Medicare HMO | Admitting: Physical Therapy

## 2023-01-07 DIAGNOSIS — R262 Difficulty in walking, not elsewhere classified: Secondary | ICD-10-CM | POA: Diagnosis not present

## 2023-01-07 DIAGNOSIS — M6281 Muscle weakness (generalized): Secondary | ICD-10-CM | POA: Diagnosis not present

## 2023-01-07 DIAGNOSIS — Z96651 Presence of right artificial knee joint: Secondary | ICD-10-CM

## 2023-01-07 DIAGNOSIS — M25661 Stiffness of right knee, not elsewhere classified: Secondary | ICD-10-CM

## 2023-01-07 NOTE — Therapy (Signed)
OUTPATIENT PHYSICAL THERAPY LOWER EXTREMITY TREATMENT   Patient Name: Tanner [name] MRN: [id number] DOB:1945-07-11, 77 y.o., male Today's Date: 01/07/2023  END OF SESSION:  PT End of Session - 01/07/23 1508     Visit Number 6    Date for PT Re-Evaluation 03/15/23    PT Start Time 1509    PT Stop Time 1554    PT Time Calculation (min) 45 min    Activity Tolerance Patient tolerated treatment well    Behavior During Therapy WFL for tasks assessed/performed               Past Medical History:  Diagnosis Date   Acute kidney failure (HCC)    Anxiety    Arthritis    Back pain    BPH (benign prostatic hypertrophy)    CHF (congestive heart failure) (HCC)    Clostridium difficile infection    Clotting disorder (HCC)    Depression    Diabetes (HCC) 12/11/2016   type 2    DVT (deep venous thrombosis) (HCC)    DVT of deep femoral vein, right (HCC) 06/29/2018   Edema, lower extremity    High cholesterol    Hypertension    Joint pain    Low back pain potentially associated with spinal stenosis    Neuromuscular disorder (HCC)    Neuropathy of lower extremity    bilateral   OSA (obstructive sleep apnea)    pt denies    Osteoarthritis    PE (pulmonary embolism)    Pneumonia    hx of x 2    Ventral hernia    Past Surgical History:  Procedure Laterality Date   CARDIOVERSION N/A 08/19/2022   Procedure: CARDIOVERSION;  Surgeon: Meriam Sprague, MD;  Location: [facility] ENDOSCOPY;  Service: Cardiovascular;  Laterality: N/A;   EYE SURGERY     HERNIA REPAIR  1999   TOTAL KNEE ARTHROPLASTY Left 08/26/2020   Procedure: LEFT TOTAL KNEE ARTHROPLASTY;  Surgeon: Gean Birchwood, MD;  Location: [facility];  Service: Orthopedics;  Laterality: Left;   TOTAL KNEE ARTHROPLASTY Right 12/15/2022   Procedure: TOTAL KNEE ARTHROPLASTY;  Surgeon: Durene Romans, MD;  Location: [facility];  Service: Orthopedics;  Laterality: Right;   Patient Active Problem List   Diagnosis Date Noted   S/P total knee  arthroplasty, right 12/15/2022   Acute respiratory failure with hypoxemia (HCC) 10/22/2022   Class 2 obesity 08/19/2022   Paroxysmal atrial fibrillation (HCC) 08/19/2022   Jaw pain 06/24/2022   Left hand pain 05/29/2022   Chronic systolic heart failure (HCC) 02/23/2022   Moderate mitral regurgitation 02/23/2022   Leg swelling 01/29/2022   ED (erectile dysfunction) 01/13/2021   Cyst, dermoid, scalp and neck 01/13/2021   Chronic kidney disease (CKD), stage III (moderate) (HCC) 12/13/2019   Carpal tunnel syndrome 12/05/2019   Elevated creatine kinase 11/15/2019   Aortic atherosclerosis (HCC) 11/15/2019   Recurrent Clostridium difficile diarrhea 05/28/2017   Peripheral neuropathy 03/17/2017   Diabetes (HCC) 12/11/2016   Allergic rhinitis 12/11/2016   Degenerative arthritis of left knee 01/30/2016   Morbid obesity (HCC) 01/30/2016   Routine general medical examination at a health care facility 04/20/2014   History of pulmonary embolus (PE) 12/19/2013   Anxiety state 03/29/2007   Depression 03/29/2007   OSA (obstructive sleep apnea) 03/29/2007   Unspecified glaucoma 03/29/2007   BPH (benign prostatic hyperplasia) 03/29/2007   OA (osteoarthritis) of knee 03/29/2007   Essential hypertension 03/29/2007   History of Bell's palsy 03/29/2007    PCP:  Hillard Danker a, MD  REFERRING PROVIDER: Durene Romans, MD  REFERRING DIAG:  Diagnosis  [phone] (ICD-10-CM) - S/P total knee arthroplasty    THERAPY DIAG:  Stiffness of right knee, not elsewhere classified  Difficulty in walking, not elsewhere classified  Status post total right knee replacement  Muscle weakness (generalized)  Rationale for Evaluation and Treatment: Rehabilitation  ONSET DATE: 12/16/22  SUBJECTIVE:   SUBJECTIVE STATEMENT: "Knee is sore"  PERTINENT HISTORY: 77 yo male s/p R TKA on 12/15/22. PMH: PE, HTN, DVT, DM, depression, anxiety, OA, L TKA 2022   PAIN:  Are you having pain? Yes: NPRS scale:  2/10 Pain location: R knee and R quad Pain description: sharp, lingering. Aggravating factors: Standing up Relieving factors: rest  PRECAUTIONS: None  WEIGHT BEARING RESTRICTIONS: Yes WBAT  FALLS:  Has patient fallen in last 6 months? Yes. Number of falls several- he tripped once and thinks the last one was caused by his knee giving way.  LIVING ENVIRONMENT: Lives with: lives alone Lives in: House/apartment Stairs: Yes: External: 4 steps; on right going up Has following equipment at home: Single point cane and Walker - 2 wheeled  OCCUPATION: Retired, plays golf  PLOF: Independent with household mobility with device  PATIENT GOALS: Play golf, work with [employer]  NEXT MD VISIT: July 18th  OBJECTIVE:   DIAGNOSTIC FINDINGS: N/A  PATIENT SURVEYS:  FOTO 42  COGNITION: Overall cognitive status: Within functional limits for tasks assessed     SENSATION: Has PN in B hands and feet  EDEMA:  R LE edema noted  POSTURE: rounded shoulders, forward head, flexed trunk , and weight shift left  PALPATION: TTP R quad  LOWER EXTREMITY ROM: WFL R hip and ankle, L hip, knee, ankle  Active ROM Right eval Left eval  Hip flexion    Hip extension    Hip abduction    Hip adduction    Hip internal rotation    Hip external rotation    Knee flexion 87   Knee extension 11   Ankle dorsiflexion    Ankle plantarflexion    Ankle inversion    Ankle eversion     (Blank rows = not tested)  LOWER EXTREMITY MMT: LLE 4/5, R knee 3+/5, HIP and ankle 4-/5   FUNCTIONAL TESTS:  5 times sit to stand: TBD Timed up and go (TUG): TBD  GAIT: Distance walked: In clinic distances Assistive device utilized: Walker - 2 wheeled Level of assistance: Modified independence Comments: Decreased WB, decreased stance on R, flexed posture   TODAY'S TREATMENT:                                                                                                                               DATE: 01/07/23 NuStep L 5 x 6 min S2S 3x5 R knee PROM with end range holds  LAQ 2lb 2x10 RLE  Hamstring curls blue 2x12 Fitter press 2 blue RLE 2x15 Gait 50ft w/  SPV cue to increase R step length and heel strike Standing march 2lb 2x10  01/05/23 NuStep L 5 x 6 min R knee PROM with eng range holds S2S 3x5  LAQ RLE 2x15 RLE HS curls green 2x10 HS Curls green 2x15 Side steps length of mat table  Alt 4 in box taps with SPC 2x5 LOB x2  12/30/22:therex: Nustep level  5, x 6 min Measured R knee flexion 100, ext in supine -10 Seated long arc quads, emphasis on eccentrics, therapist assisted with concentric terminal extension Standing for terminal R knee extension against green theraband 15x Forward step ups onto 2" step within walker 15x Seated for R LE hip and knee drivers into physioball, therapist providing some resistance Supine for SLR R, 10x, measured lage 20 degrees. Good quads contraction noted Seated R ankle plantarflexion against blue theraband 20x     12/28/22 NuStep L5 x 6 minutes Supine RLE strengthening-knee press, SAQ, SLR, HS x 10 each Seated LAQ, active knee flex against G tband x 10 each AAROM for R knee flexion R kn AAROM12-98 degrees Step ups onto 4" step, leading with RLE x 10, BUE support on RW, encouraged to minimize UE support. Ambulation with RW x 130', VC to stand up and avoid WB through arms  12/23/22 NuStep L5 x 6 min PROM/stretching/patellar mobs R knee Quad sets x10 LAQs 1lb RLE 2x10 HS curls green t-band RLE 2x10 STS x10; x5 Fitter presses 2 blue bands 2x15 Standing marches holding onto WC 2x10 Education   PATIENT EDUCATION:  Education details: POC, HEP Person educated: Patient Education method: Medical illustrator Education comprehension: verbalized understanding and returned demonstration  HOME EXERCISE PROGRAM: [email or web address]  ASSESSMENT:  CLINICAL IMPRESSION: Not in much pain, tolerated today's Rx well. Does have flexed  posture trunk, B hips, knees. Lower  Leg/ ankle remains swollen, but not warm or tender with palpation. Progress with more functional interventions. Weakness present with sit to stands but gave good effort. Progressed to gait with SPC, decrease step length with RLE. Some pain at the end range of PROM. Cue needed to hold contraction with LAQ. Continues to benefit from skilled PT to address his deficits.  OBJECTIVE IMPAIRMENTS: Abnormal gait, decreased activity tolerance, decreased balance, decreased endurance, decreased mobility, difficulty walking, decreased ROM, decreased strength, impaired flexibility, improper body mechanics, and pain.   ACTIVITY LIMITATIONS: lifting, bending, sitting, squatting, sleeping, stairs, and locomotion level  PARTICIPATION LIMITATIONS: meal prep, cleaning, laundry, driving, shopping, and community activity  PERSONAL FACTORS: Past/current experiences are also affecting patient's functional outcome.   REHAB POTENTIAL: Good  CLINICAL DECISION MAKING: Evolving/moderate complexity  EVALUATION COMPLEXITY: Moderate   GOALS: Goals reviewed with patient? Yes  SHORT TERM GOALS: Target date: 01/02/23 I with basic HEP Baseline: Goal status: INITIAL  LONG TERM GOALS: Target date: 03/15/23  I with final HEP Baseline:  Goal status: INITIAL  2.  Complete 5 x STS in < 12 sec Baseline: TBD Goal status: INITIAL  3.  Complete Tug in < 12 sec, LRAD Baseline: TBD Goal status: INITIAL  4.  Increase R knee ROM from 0-120 Baseline: 11-87 AROM Goal status: INITIAL  5.  Ambulation x at least 400' on level and unlevel surfaces, MI, with LRAD, equal WB through B LE. Baseline: 80', RW, slow, Decreased WB through RLE Goal status: INITIAL  6.  Increase R knee strength to at least 4+/5 Baseline: 3+/5 Goal status: INITIAL   PLAN:  PT FREQUENCY: 3x/week  PT DURATION: 12 weeks  PLANNED INTERVENTIONS:  Therapeutic exercises, Therapeutic activity, Neuromuscular  re-education, Balance training, Gait training, Patient/Family education, Self Care, Joint mobilization, Stair training, Cryotherapy, Moist heat, Vasopneumatic device, and Manual therapy  PLAN FOR NEXT SESSION: continue with strength and ROM training   Amy Speaks, PT, DPT [facility] 01/07/2023, 3:09 PM

## 2023-01-08 ENCOUNTER — Other Ambulatory Visit: Payer: Self-pay | Admitting: Internal Medicine

## 2023-01-11 ENCOUNTER — Encounter: Payer: Self-pay | Admitting: Physical Therapy

## 2023-01-11 ENCOUNTER — Other Ambulatory Visit (INDEPENDENT_AMBULATORY_CARE_PROVIDER_SITE_OTHER): Payer: Self-pay | Admitting: Family Medicine

## 2023-01-11 ENCOUNTER — Ambulatory Visit: Payer: Medicare HMO | Admitting: Physical Therapy

## 2023-01-11 DIAGNOSIS — M25661 Stiffness of right knee, not elsewhere classified: Secondary | ICD-10-CM | POA: Diagnosis not present

## 2023-01-11 DIAGNOSIS — R262 Difficulty in walking, not elsewhere classified: Secondary | ICD-10-CM

## 2023-01-11 DIAGNOSIS — M6281 Muscle weakness (generalized): Secondary | ICD-10-CM | POA: Diagnosis not present

## 2023-01-11 DIAGNOSIS — Z96651 Presence of right artificial knee joint: Secondary | ICD-10-CM

## 2023-01-11 NOTE — Therapy (Signed)
OUTPATIENT PHYSICAL THERAPY LOWER EXTREMITY TREATMENT   Patient Name: Tanner [name] MRN: [id number] DOB:[birth year], 77 y.o., male Today's Date: 01/11/2023  END OF SESSION:  PT End of Session - 01/11/23 1504     Visit Number 7    Date for PT Re-Evaluation 03/15/23    PT Start Time 1502    PT Stop Time 1542    PT Time Calculation (min) 40 min    Activity Tolerance Patient tolerated treatment well    Behavior During Therapy WFL for tasks assessed/performed                Past Medical History:  Diagnosis Date   Acute kidney failure (HCC)    Anxiety    Arthritis    Back pain    BPH (benign prostatic hypertrophy)    CHF (congestive heart failure) (HCC)    Clostridium difficile infection    Clotting disorder (HCC)    Depression    Diabetes (HCC) 12/11/2016   type 2    DVT (deep venous thrombosis) (HCC)    DVT of deep femoral vein, right (HCC) 06/29/2018   Edema, lower extremity    High cholesterol    Hypertension    Joint pain    Low back pain potentially associated with spinal stenosis    Neuromuscular disorder (HCC)    Neuropathy of lower extremity    bilateral   OSA (obstructive sleep apnea)    pt denies    Osteoarthritis    PE (pulmonary embolism)    Pneumonia    hx of x 2    Ventral hernia    Past Surgical History:  Procedure Laterality Date   CARDIOVERSION N/A 08/19/2022   Procedure: CARDIOVERSION;  Surgeon: Meriam Sprague, MD;  Location: [facility] ENDOSCOPY;  Service: Cardiovascular;  Laterality: N/A;   EYE SURGERY     HERNIA REPAIR  1999   TOTAL KNEE ARTHROPLASTY Left 08/26/2020   Procedure: LEFT TOTAL KNEE ARTHROPLASTY;  Surgeon: Gean Birchwood, MD;  Location: [facility]S;  Service: Orthopedics;  Laterality: Left;   TOTAL KNEE ARTHROPLASTY Right 12/15/2022   Procedure: TOTAL KNEE ARTHROPLASTY;  Surgeon: Durene Romans, MD;  Location: [facility];  Service: Orthopedics;  Laterality: Right;   Patient Active Problem List   Diagnosis Date Noted   S/P total knee  arthroplasty, right 12/15/2022   Acute respiratory failure with hypoxemia (HCC) 10/22/2022   Class 2 obesity 08/19/2022   Paroxysmal atrial fibrillation (HCC) 08/19/2022   Jaw pain 06/24/2022   Left hand pain 05/29/2022   Chronic systolic heart failure (HCC) 02/23/2022   Moderate mitral regurgitation 02/23/2022   Leg swelling 01/29/2022   ED (erectile dysfunction) 01/13/2021   Cyst, dermoid, scalp and neck 01/13/2021   Chronic kidney disease (CKD), stage III (moderate) (HCC) 12/13/2019   Carpal tunnel syndrome 12/05/2019   Elevated creatine kinase 11/15/2019   Aortic atherosclerosis (HCC) 11/15/2019   Recurrent Clostridium difficile diarrhea 05/28/2017   Peripheral neuropathy 03/17/2017   Diabetes (HCC) 12/11/2016   Allergic rhinitis 12/11/2016   Degenerative arthritis of left knee 01/30/2016   Morbid obesity (HCC) 01/30/2016   Routine general medical examination at a health care facility 04/20/2014   History of pulmonary embolus (PE) 12/19/2013   Anxiety state 03/29/2007   Depression 03/29/2007   OSA (obstructive sleep apnea) 03/29/2007   Unspecified glaucoma 03/29/2007   BPH (benign prostatic hyperplasia) 03/29/2007   OA (osteoarthritis) of knee 03/29/2007   Essential hypertension 03/29/2007   History of Bell's palsy 03/29/2007  PCP: Hillard Danker a, MD  REFERRING PROVIDER: Durene Romans, MD  REFERRING DIAG:  Diagnosis  [phone] (ICD-10-CM) - S/P total knee arthroplasty    THERAPY DIAG:  Stiffness of right knee, not elsewhere classified  Difficulty in walking, not elsewhere classified  Status post total right knee replacement  Muscle weakness (generalized)  Rationale for Evaluation and Treatment: Rehabilitation  ONSET DATE: 12/16/22  SUBJECTIVE:   SUBJECTIVE STATEMENT: "Knee is sore"  PERTINENT HISTORY: 77 yo male s/p R TKA on 12/15/22. PMH: PE, HTN, DVT, DM, depression, anxiety, OA, L TKA 2022   PAIN:  Are you having pain? Yes: NPRS scale:  2/10 Pain location: R knee and R quad Pain description: sharp, lingering. Aggravating factors: Standing up Relieving factors: rest  PRECAUTIONS: None  WEIGHT BEARING RESTRICTIONS: Yes WBAT  FALLS:  Has patient fallen in last 6 months? Yes. Number of falls several- he tripped once and thinks the last one was caused by his knee giving way.  LIVING ENVIRONMENT: Lives with: lives alone Lives in: House/apartment Stairs: Yes: External: 4 steps; on right going up Has following equipment at home: Single point cane and Walker - 2 wheeled  OCCUPATION: Retired, plays golf  PLOF: Independent with household mobility with device  PATIENT GOALS: Play golf, work with [employer]  NEXT MD VISIT: July 18th  OBJECTIVE:   DIAGNOSTIC FINDINGS: N/A  PATIENT SURVEYS:  FOTO 42  COGNITION: Overall cognitive status: Within functional limits for tasks assessed     SENSATION: Has PN in B hands and feet  EDEMA:  R LE edema noted  POSTURE: rounded shoulders, forward head, flexed trunk , and weight shift left  PALPATION: TTP R quad  LOWER EXTREMITY ROM: WFL R hip and ankle, L hip, knee, ankle  Active ROM Right eval Left eval R 01/11/23  Hip flexion     Hip extension     Hip abduction     Hip adduction     Hip internal rotation     Hip external rotation     Knee flexion 87  A94/P97  Knee extension 11  8  Ankle dorsiflexion     Ankle plantarflexion     Ankle inversion     Ankle eversion      (Blank rows = not tested)  LOWER EXTREMITY MMT: LLE 4/5, R knee 3+/5, HIP and ankle 4-/5   FUNCTIONAL TESTS:  5 times sit to stand: TBD Timed up and go (TUG): TBD  GAIT: Distance walked: In clinic distances Assistive device utilized: Walker - 2 wheeled Level of assistance: Modified independence Comments: Decreased WB, decreased stance on R, flexed posture   TODAY'S TREATMENT:                                                                                                                               DATE: 01/11/23 NuStep L6 x 6 minutes. Supine knee press 5 x 5 sec holds Supine heel slides, SLR, SLR with  hip ER x 10 each Seated A and PROM for R knee flex Seated long kicks and knee flex against G Tband, 10 reps each Seated clamshells with G tband Mini squats x10 reps of each exercise. Ambulation with SPC, 2 x 80'. Patient prefers to hold cane in R hand, observed and demonstrates good technique, does not lean on cane, step through gait.  01/07/23 NuStep L 5 x 6 min S2S 3x5 R knee PROM with end range holds  LAQ 2lb 2x10 RLE  Hamstring curls blue 2x12 Fitter press 2 blue RLE 2x15 Gait 46ft w/ SPV cue to increase R step length and heel strike Standing march 2lb 2x10  01/05/23 NuStep L 5 x 6 min R knee PROM with eng range holds S2S 3x5  LAQ RLE 2x15 RLE HS curls green 2x10 HS Curls green 2x15 Side steps length of mat table  Alt 4 in box taps with SPC 2x5 LOB x2  12/30/22:therex: Nustep level  5, x 6 min Measured R knee flexion 100, ext in supine -10 Seated long arc quads, emphasis on eccentrics, therapist assisted with concentric terminal extension Standing for terminal R knee extension against green theraband 15x Forward step ups onto 2" step within walker 15x Seated for R LE hip and knee drivers into physioball, therapist providing some resistance Supine for SLR R, 10x, measured lage 20 degrees. Good quads contraction noted Seated R ankle plantarflexion against blue theraband 20x     12/28/22 NuStep L5 x 6 minutes Supine RLE strengthening-knee press, SAQ, SLR, HS x 10 each Seated LAQ, active knee flex against G tband x 10 each AAROM for R knee flexion R kn [id number] degrees Step ups onto 4" step, leading with RLE x 10, BUE support on RW, encouraged to minimize UE support. Ambulation with RW x 130', VC to stand up and avoid WB through arms  12/23/22 NuStep L5 x 6 min PROM/stretching/patellar mobs R knee Quad sets x10 LAQs 1lb RLE 2x10 HS curls  green t-band RLE 2x10 STS x10; x5 Fitter presses 2 blue bands 2x15 Standing marches holding onto WC 2x10 Education   PATIENT EDUCATION:  Education details: POC, HEP Person educated: Patient Education method: Medical illustrator Education comprehension: verbalized understanding and returned demonstration  HOME EXERCISE PROGRAM: [email or web address]  ASSESSMENT:  CLINICAL IMPRESSION: Patient returned to Dr. He had a small area of wound dehiscence, which appears to be healing. He demosntrates progress in both ROM and strength of RLE. HEP updated to include more challenging strengthening exercises.  OBJECTIVE IMPAIRMENTS: Abnormal gait, decreased activity tolerance, decreased balance, decreased endurance, decreased mobility, difficulty walking, decreased ROM, decreased strength, impaired flexibility, improper body mechanics, and pain.   ACTIVITY LIMITATIONS: lifting, bending, sitting, squatting, sleeping, stairs, and locomotion level  PARTICIPATION LIMITATIONS: meal prep, cleaning, laundry, driving, shopping, and community activity  PERSONAL FACTORS: Past/current experiences are also affecting patient's functional outcome.   REHAB POTENTIAL: Good  CLINICAL DECISION MAKING: Evolving/moderate complexity  EVALUATION COMPLEXITY: Moderate   GOALS: Goals reviewed with patient? Yes  SHORT TERM GOALS: Target date: 01/02/23 I with basic HEP Baseline: Goal status: 01/11/23 met  LONG TERM GOALS: Target date: 03/15/23  I with final HEP Baseline:  Goal status: INITIAL  2.  Complete 5 x STS in < 12 sec Baseline: TBD Goal status: INITIAL  3.  Complete Tug in < 12 sec, LRAD Baseline: TBD Goal status: INITIAL  4.  Increase R knee ROM from 0-120 Baseline: 11-87 AROM Goal status: 01/11/23-7-97 ongoing  5.  Ambulation x  at least 400' on level and unlevel surfaces, MI, with LRAD, equal WB through B LE. Baseline: 80', RW, slow, Decreased WB through RLE Goal status: INITIAL  6.   Increase R knee strength to at least 4+/5 Baseline: 3+/5 Goal status: INITIAL  PLAN:  PT FREQUENCY: 3x/week  PT DURATION: 12 weeks  PLANNED INTERVENTIONS: Therapeutic exercises, Therapeutic activity, Neuromuscular re-education, Balance training, Gait training, Patient/Family education, Self Care, Joint mobilization, Stair training, Cryotherapy, Moist heat, Vasopneumatic device, and Manual therapy  PLAN FOR NEXT SESSION: continue with strength and ROM training   Oley Balm DPT 01/11/23 3:45 PM  01/11/2023, 3:45 PM

## 2023-01-11 NOTE — Telephone Encounter (Signed)
Last Visit:12/02/2022     Upcoming appointments: 06/07/2023           Grover Canavan, MA  01/11/2023, 14:57

## 2023-01-13 ENCOUNTER — Encounter: Payer: Self-pay | Admitting: Physical Therapy

## 2023-01-13 ENCOUNTER — Ambulatory Visit: Payer: Medicare HMO | Admitting: Physical Therapy

## 2023-01-13 ENCOUNTER — Other Ambulatory Visit (INDEPENDENT_AMBULATORY_CARE_PROVIDER_SITE_OTHER): Payer: Commercial Managed Care - PPO

## 2023-01-13 DIAGNOSIS — M6281 Muscle weakness (generalized): Secondary | ICD-10-CM

## 2023-01-13 DIAGNOSIS — Z96651 Presence of right artificial knee joint: Secondary | ICD-10-CM | POA: Diagnosis not present

## 2023-01-13 DIAGNOSIS — M25661 Stiffness of right knee, not elsewhere classified: Secondary | ICD-10-CM | POA: Diagnosis not present

## 2023-01-13 DIAGNOSIS — R262 Difficulty in walking, not elsewhere classified: Secondary | ICD-10-CM | POA: Diagnosis not present

## 2023-01-13 DIAGNOSIS — E782 Mixed hyperlipidemia: Secondary | ICD-10-CM

## 2023-01-13 DIAGNOSIS — Z7189 Other specified counseling: Secondary | ICD-10-CM

## 2023-01-13 DIAGNOSIS — I10 Essential (primary) hypertension: Secondary | ICD-10-CM

## 2023-01-13 NOTE — Therapy (Signed)
OUTPATIENT PHYSICAL THERAPY LOWER EXTREMITY TREATMENT   Patient Name: Tanner [name] MRN: [id number] DOB:1946/06/12, 77 y.o., male Today's Date: 01/13/2023  END OF SESSION:  PT End of Session - 01/13/23 1502     Visit Number 8    Date for PT Re-Evaluation 03/15/23    PT Start Time 1456    PT Stop Time 1540    PT Time Calculation (min) 44 min    Activity Tolerance Patient tolerated treatment well    Behavior During Therapy WFL for tasks assessed/performed                 Past Medical History:  Diagnosis Date   Acute kidney failure (HCC)    Anxiety    Arthritis    Back pain    BPH (benign prostatic hypertrophy)    CHF (congestive heart failure) (HCC)    Clostridium difficile infection    Clotting disorder (HCC)    Depression    Diabetes (HCC) 12/11/2016   type 2    DVT (deep venous thrombosis) (HCC)    DVT of deep femoral vein, right (HCC) 06/29/2018   Edema, lower extremity    High cholesterol    Hypertension    Joint pain    Low back pain potentially associated with spinal stenosis    Neuromuscular disorder (HCC)    Neuropathy of lower extremity    bilateral   OSA (obstructive sleep apnea)    pt denies    Osteoarthritis    PE (pulmonary embolism)    Pneumonia    hx of x 2    Ventral hernia    Past Surgical History:  Procedure Laterality Date   CARDIOVERSION N/A 08/19/2022   Procedure: CARDIOVERSION;  Surgeon: Meriam Sprague, MD;  Location: [facility] ENDOSCOPY;  Service: Cardiovascular;  Laterality: N/A;   EYE SURGERY     HERNIA REPAIR  1999   TOTAL KNEE ARTHROPLASTY Left 08/26/2020   Procedure: LEFT TOTAL KNEE ARTHROPLASTY;  Surgeon: Gean Birchwood, MD;  Location: [facility]S;  Service: Orthopedics;  Laterality: Left;   TOTAL KNEE ARTHROPLASTY Right 12/15/2022   Procedure: TOTAL KNEE ARTHROPLASTY;  Surgeon: Durene Romans, MD;  Location: [facility];  Service: Orthopedics;  Laterality: Right;   Patient Active Problem List   Diagnosis Date Noted   S/P total knee  arthroplasty, right 12/15/2022   Acute respiratory failure with hypoxemia (HCC) 10/22/2022   Class 2 obesity 08/19/2022   Paroxysmal atrial fibrillation (HCC) 08/19/2022   Jaw pain 06/24/2022   Left hand pain 05/29/2022   Chronic systolic heart failure (HCC) 02/23/2022   Moderate mitral regurgitation 02/23/2022   Leg swelling 01/29/2022   ED (erectile dysfunction) 01/13/2021   Cyst, dermoid, scalp and neck 01/13/2021   Chronic kidney disease (CKD), stage III (moderate) (HCC) 12/13/2019   Carpal tunnel syndrome 12/05/2019   Elevated creatine kinase 11/15/2019   Aortic atherosclerosis (HCC) 11/15/2019   Recurrent Clostridium difficile diarrhea 05/28/2017   Peripheral neuropathy 03/17/2017   Diabetes (HCC) 12/11/2016   Allergic rhinitis 12/11/2016   Degenerative arthritis of left knee 01/30/2016   Morbid obesity (HCC) 01/30/2016   Routine general medical examination at a health care facility 04/20/2014   History of pulmonary embolus (PE) 12/19/2013   Anxiety state 03/29/2007   Depression 03/29/2007   OSA (obstructive sleep apnea) 03/29/2007   Unspecified glaucoma 03/29/2007   BPH (benign prostatic hyperplasia) 03/29/2007   OA (osteoarthritis) of knee 03/29/2007   Essential hypertension 03/29/2007   History of Bell's palsy 03/29/2007  PCP: Hillard Danker a, MD  REFERRING PROVIDER: Durene Romans, MD  REFERRING DIAG:  Diagnosis  [phone] (ICD-10-CM) - S/P total knee arthroplasty    THERAPY DIAG:  Stiffness of right knee, not elsewhere classified  Difficulty in walking, not elsewhere classified  Status post total right knee replacement  Muscle weakness (generalized)  Rationale for Evaluation and Treatment: Rehabilitation  ONSET DATE: 12/16/22  SUBJECTIVE:   SUBJECTIVE STATEMENT: The knee is no longer draining.  PERTINENT HISTORY: 77 yo male s/p R TKA on 12/15/22. PMH: PE, HTN, DVT, DM, depression, anxiety, OA, L TKA 2022   PAIN:  Are you having pain? Yes:  NPRS scale: 2/10 Pain location: R knee and R quad Pain description: sharp, lingering. Aggravating factors: Standing up Relieving factors: rest  PRECAUTIONS: None  WEIGHT BEARING RESTRICTIONS: Yes WBAT  FALLS:  Has patient fallen in last 6 months? Yes. Number of falls several- he tripped once and thinks the last one was caused by his knee giving way.  LIVING ENVIRONMENT: Lives with: lives alone Lives in: House/apartment Stairs: Yes: External: 4 steps; on right going up Has following equipment at home: Single point cane and Walker - 2 wheeled  OCCUPATION: Retired, plays golf  PLOF: Independent with household mobility with device  PATIENT GOALS: Play golf, work with [employer]  NEXT MD VISIT: July 18th  OBJECTIVE:   DIAGNOSTIC FINDINGS: N/A  PATIENT SURVEYS:  FOTO 42  COGNITION: Overall cognitive status: Within functional limits for tasks assessed     SENSATION: Has PN in B hands and feet  EDEMA:  R LE edema noted  POSTURE: rounded shoulders, forward head, flexed trunk , and weight shift left  PALPATION: TTP R quad  LOWER EXTREMITY ROM: WFL R hip and ankle, L hip, knee, ankle  Active ROM Right eval Left eval R 01/11/23  Hip flexion     Hip extension     Hip abduction     Hip adduction     Hip internal rotation     Hip external rotation     Knee flexion 87  A94/P97  Knee extension 11  8  Ankle dorsiflexion     Ankle plantarflexion     Ankle inversion     Ankle eversion      (Blank rows = not tested)  LOWER EXTREMITY MMT: LLE 4/5, R knee 3+/5, HIP and ankle 4-/5   FUNCTIONAL TESTS:  5 times sit to stand: TBD Timed up and go (TUG): TBD  GAIT: Distance walked: In clinic distances Assistive device utilized: Walker - 2 wheeled Level of assistance: Modified independence Comments: Decreased WB, decreased stance on R, flexed posture   TODAY'S TREATMENT:                                                                                                                               DATE: 01/13/23 NuStep L5 x 6 minutes Seated knee flexion, 35#, BLE, 2 x 10 reps, RLE 15#,  2 x 10 reps Seated knee ext, 5#, BLE, 2 x 10 reps RLE eccentric ext, 5#, 2 x 10 R knee flexion A/P- 108 Standing on Airex- static, lateral and ant/post weight shifts, marching, minimizing UE support Fitter press 2 blue bands, 3 x 10 with power. B side step on Airex plank in parallel bars. Able to release hands about 80% of the time. 3 x each way Ambulation with SPC, 1 x 100', then B side stepping x 10 each, S.  01/11/23 NuStep L6 x 6 minutes. Supine knee press 5 x 5 sec holds Supine heel slides, SLR, SLR with hip ER x 10 each Seated A and PROM for R knee flex Seated long kicks and knee flex against G Tband, 10 reps each Seated clamshells with G tband Mini squats x10 reps of each exercise. Ambulation with SPC, 2 x 80'. Patient prefers to hold cane in R hand, observed and demonstrates good technique, does not lean on cane, step through gait.  01/07/23 NuStep L 5 x 6 min S2S 3x5 R knee PROM with end range holds  LAQ 2lb 2x10 RLE  Hamstring curls blue 2x12 Fitter press 2 blue RLE 2x15 Gait 29ft w/ SPV cue to increase R step length and heel strike Standing march 2lb 2x10  01/05/23 NuStep L 5 x 6 min R knee PROM with eng range holds S2S 3x5  LAQ RLE 2x15 RLE HS curls green 2x10 HS Curls green 2x15 Side steps length of mat table  Alt 4 in box taps with SPC 2x5 LOB x2  12/30/22:therex: Nustep level  5, x 6 min Measured R knee flexion 100, ext in supine -10 Seated long arc quads, emphasis on eccentrics, therapist assisted with concentric terminal extension Standing for terminal R knee extension against green theraband 15x Forward step ups onto 2" step within walker 15x Seated for R LE hip and knee drivers into physioball, therapist providing some resistance Supine for SLR R, 10x, measured lage 20 degrees. Good quads contraction noted Seated R ankle  plantarflexion against blue theraband 20x  12/28/22 NuStep L5 x 6 minutes Supine RLE strengthening-knee press, SAQ, SLR, HS x 10 each Seated LAQ, active knee flex against G tband x 10 each AAROM for R knee flexion R kn AAROM12-98 degrees Step ups onto 4" step, leading with RLE x 10, BUE support on RW, encouraged to minimize UE support. Ambulation with RW x 130', VC to stand up and avoid WB through arms  12/23/22 NuStep L5 x 6 min PROM/stretching/patellar mobs R knee Quad sets x10 LAQs 1lb RLE 2x10 HS curls green t-band RLE 2x10 STS x10; x5 Fitter presses 2 blue bands 2x15 Standing marches holding onto WC 2x10 Education   PATIENT EDUCATION:  Education details: POC, HEP Person educated: Patient Education method: Medical illustrator Education comprehension: verbalized understanding and returned demonstration  HOME EXERCISE PROGRAM: [email or web address]  ASSESSMENT:  CLINICAL IMPRESSION: Patient continues to progress well. Increased strength challenges today with no issues. Also increased balance challenges and his ROM improved.  OBJECTIVE IMPAIRMENTS: Abnormal gait, decreased activity tolerance, decreased balance, decreased endurance, decreased mobility, difficulty walking, decreased ROM, decreased strength, impaired flexibility, improper body mechanics, and pain.   ACTIVITY LIMITATIONS: lifting, bending, sitting, squatting, sleeping, stairs, and locomotion level  PARTICIPATION LIMITATIONS: meal prep, cleaning, laundry, driving, shopping, and community activity  PERSONAL FACTORS: Past/current experiences are also affecting patient's functional outcome.   REHAB POTENTIAL: Good  CLINICAL DECISION MAKING: Evolving/moderate complexity  EVALUATION COMPLEXITY: Moderate   GOALS: Goals reviewed with  patient? Yes  SHORT TERM GOALS: Target date: 01/02/23 I with basic HEP Baseline: Goal status: 01/11/23 met  LONG TERM GOALS: Target date: 03/15/23  I with final HEP Baseline:   Goal status: INITIAL  2.  Complete 5 x STS in < 12 sec Baseline: TBD Goal status: INITIAL  3.  Complete Tug in < 12 sec, LRAD Baseline: TBD Goal status: INITIAL  4.  Increase R knee ROM from 0-120 Baseline: 11-87 AROM Goal status: 01/11/23-7-97 ongoing  5.  Ambulation x at least 400' on level and unlevel surfaces, MI, with LRAD, equal WB through B LE. Baseline: 80', RW, slow, Decreased WB through RLE Goal status: INITIAL  6.  Increase R knee strength to at least 4+/5 Baseline: 3+/5 Goal status: INITIAL  PLAN:  PT FREQUENCY: 3x/week  PT DURATION: 12 weeks  PLANNED INTERVENTIONS: Therapeutic exercises, Therapeutic activity, Neuromuscular re-education, Balance training, Gait training, Patient/Family education, Self Care, Joint mobilization, Stair training, Cryotherapy, Moist heat, Vasopneumatic device, and Manual therapy  PLAN FOR NEXT SESSION: continue with strength and ROM training   Oley Balm DPT 01/13/23 3:46 PM

## 2023-01-13 NOTE — Progress Notes (Signed)
Dr. Rhoderick Moody, DO  ,    This patient has met the requirements to bill for Chronic Care Management services this month. Please sign this encounter as you typically would any other encounter. There is NO SPECIAL ATTESTATION needed . COSIGN ONLY. Please let us know if you have any questions at all.    Chronic Care Management Time Documentation on 01/13/2023 14:46.   Time spent during current encounter is 1 minutes.   Cumulative time during current month's episode (month-to-date) is 29 minutes.          ICD-10-CM    1. Encounter for counseling for care management of patient with chronic conditions and complex health needs using nurse-based model  Z71.89       2. Essential hypertension  I10       3. Mixed hyperlipidemia  E78.2             acetaminophen (TYLENOL) 500 mg Oral Tablet, Take 1 Tablet (500 mg total) by mouth Every 4 hours as needed for Pain (pt takes tylenol pm)  alfuzosin (UROXATRAL) 10 mg Oral Tablet Sustained Release 24 hr, TAKE ONE TABLET BY MOUTH EVERY DAY  apixaban (ELIQUIS) 5 mg Oral Tablet, Take 1 Tablet (5 mg total) by mouth Twice daily SHIP TO PATIENT,   [id number]  STATE LICENSE- [id number]  NPI- [id number]  diltiazem HCl (TIAZAC) 240 mg Oral Capsule,Sustained Action 24 hr, TAKE ONE CAPSULE BY MOUTH DAILY  finasteride (PROSCAR) 5 mg Oral Tablet, Take 1 Tablet (5 mg total) by mouth Once a day  flecainide (TAMBOCOR) 100 mg Oral Tablet, TAKE ONE TABLET BY MOUTH TWICE DAILY  furosemide (LASIX) 40 mg Oral Tablet, TAKE ONE TABLET BY MOUTH EVERY DAY  losartan (COZAAR) 50 mg Oral Tablet, Take 1 Tablet (50 mg total) by mouth Once a day  nitroGLYCERIN (NITROSTAT) 0.4 mg Sublingual Tablet, Sublingual, Place 1 Tablet (0.4 mg total) under the tongue Every 5 minutes as needed for Chest pain  potassium chloride (KLOR-CON) 10 mEq Oral Tablet Sustained Release, TAKE ONE TABLET BY MOUTH EVERY DAY  rosuvastatin (CRESTOR) 10 mg Oral Tablet, Take 1 Tablet (10 mg total) by mouth Every evening    No  facility-administered medications prior to visit.        Thanks,  Your Chronic Care Management Team

## 2023-01-16 ENCOUNTER — Other Ambulatory Visit: Payer: Self-pay | Admitting: Internal Medicine

## 2023-01-18 ENCOUNTER — Ambulatory Visit: Payer: Medicare HMO | Attending: Student | Admitting: Physical Therapy

## 2023-01-18 DIAGNOSIS — R262 Difficulty in walking, not elsewhere classified: Secondary | ICD-10-CM | POA: Insufficient documentation

## 2023-01-18 DIAGNOSIS — M6281 Muscle weakness (generalized): Secondary | ICD-10-CM | POA: Insufficient documentation

## 2023-01-18 DIAGNOSIS — Z96651 Presence of right artificial knee joint: Secondary | ICD-10-CM | POA: Insufficient documentation

## 2023-01-18 DIAGNOSIS — M25661 Stiffness of right knee, not elsewhere classified: Secondary | ICD-10-CM | POA: Insufficient documentation

## 2023-01-18 NOTE — Therapy (Signed)
OUTPATIENT PHYSICAL THERAPY LOWER EXTREMITY TREATMENT   Patient Name: Tanner [name] MRN: [id number] DOB:[birth year], 77 y.o., male Today's Date: 01/18/2023  END OF SESSION:  PT End of Session - 01/18/23 1426     Visit Number 9    Date for PT Re-Evaluation 03/15/23    PT Start Time 1430    PT Stop Time 1515    PT Time Calculation (min) 45 min    Activity Tolerance Patient tolerated treatment well    Behavior During Therapy WFL for tasks assessed/performed                 Past Medical History:  Diagnosis Date   Acute kidney failure (HCC)    Anxiety    Arthritis    Back pain    BPH (benign prostatic hypertrophy)    CHF (congestive heart failure) (HCC)    Clostridium difficile infection    Clotting disorder (HCC)    Depression    Diabetes (HCC) 12/11/2016   type 2    DVT (deep venous thrombosis) (HCC)    DVT of deep femoral vein, right (HCC) 06/29/2018   Edema, lower extremity    High cholesterol    Hypertension    Joint pain    Low back pain potentially associated with spinal stenosis    Neuromuscular disorder (HCC)    Neuropathy of lower extremity    bilateral   OSA (obstructive sleep apnea)    pt denies    Osteoarthritis    PE (pulmonary embolism)    Pneumonia    hx of x 2    Ventral hernia    Past Surgical History:  Procedure Laterality Date   CARDIOVERSION N/A 08/19/2022   Procedure: CARDIOVERSION;  Surgeon: Meriam Sprague, MD;  Location: [facility] ENDOSCOPY;  Service: Cardiovascular;  Laterality: N/A;   EYE SURGERY     HERNIA REPAIR  1999   TOTAL KNEE ARTHROPLASTY Left 08/26/2020   Procedure: LEFT TOTAL KNEE ARTHROPLASTY;  Surgeon: Gean Birchwood, MD;  Location: [facility] ORS;  Service: Orthopedics;  Laterality: Left;   TOTAL KNEE ARTHROPLASTY Right 12/15/2022   Procedure: TOTAL KNEE ARTHROPLASTY;  Surgeon: Durene Romans, MD;  Location: [facility]S;  Service: Orthopedics;  Laterality: Right;   Patient Active Problem List   Diagnosis Date Noted   S/P total knee  arthroplasty, right 12/15/2022   Acute respiratory failure with hypoxemia (HCC) 10/22/2022   Class 2 obesity 08/19/2022   Paroxysmal atrial fibrillation (HCC) 08/19/2022   Jaw pain 06/24/2022   Left hand pain 05/29/2022   Chronic systolic heart failure (HCC) 02/23/2022   Moderate mitral regurgitation 02/23/2022   Leg swelling 01/29/2022   ED (erectile dysfunction) 01/13/2021   Cyst, dermoid, scalp and neck 01/13/2021   Chronic kidney disease (CKD), stage III (moderate) (HCC) 12/13/2019   Carpal tunnel syndrome 12/05/2019   Elevated creatine kinase 11/15/2019   Aortic atherosclerosis (HCC) 11/15/2019   Recurrent Clostridium difficile diarrhea 05/28/2017   Peripheral neuropathy 03/17/2017   Diabetes (HCC) 12/11/2016   Allergic rhinitis 12/11/2016   Degenerative arthritis of left knee 01/30/2016   Morbid obesity (HCC) 01/30/2016   Routine general medical examination at a health care facility 04/20/2014   History of pulmonary embolus (PE) 12/19/2013   Anxiety state 03/29/2007   Depression 03/29/2007   OSA (obstructive sleep apnea) 03/29/2007   Unspecified glaucoma 03/29/2007   BPH (benign prostatic hyperplasia) 03/29/2007   OA (osteoarthritis) of knee 03/29/2007   Essential hypertension 03/29/2007   History of Bell's palsy 03/29/2007  PCP: Hillard Danker a, MD  REFERRING PROVIDER: Durene Romans, MD  REFERRING DIAG:  Diagnosis  [phone] (ICD-10-CM) - S/P total knee arthroplasty    THERAPY DIAG:  Stiffness of right knee, not elsewhere classified  Difficulty in walking, not elsewhere classified  Status post total right knee replacement  Muscle weakness (generalized)  Rationale for Evaluation and Treatment: Rehabilitation  ONSET DATE: 12/16/22  SUBJECTIVE:   SUBJECTIVE STATEMENT: "Im all right"  PERTINENT HISTORY: 77 yo male s/p R TKA on 12/15/22. PMH: PE, HTN, DVT, DM, depression, anxiety, OA, L TKA 2022   PAIN:  Are you having pain? Yes: NPRS scale:  0/10 Pain location: R knee and R quad Pain description: sharp, lingering. Aggravating factors: Standing up Relieving factors: rest  PRECAUTIONS: None  WEIGHT BEARING RESTRICTIONS: Yes WBAT  FALLS:  Has patient fallen in last 6 months? Yes. Number of falls several- he tripped once and thinks the last one was caused by his knee giving way.  LIVING ENVIRONMENT: Lives with: lives alone Lives in: House/apartment Stairs: Yes: External: 4 steps; on right going up Has following equipment at home: Single point cane and Walker - 2 wheeled  OCCUPATION: Retired, plays golf  PLOF: Independent with household mobility with device  PATIENT GOALS: Play golf, work with [employer]  NEXT MD VISIT: July 18th  OBJECTIVE:   DIAGNOSTIC FINDINGS: N/A  PATIENT SURVEYS:  FOTO 42  COGNITION: Overall cognitive status: Within functional limits for tasks assessed     SENSATION: Has PN in B hands and feet  EDEMA:  R LE edema noted  POSTURE: rounded shoulders, forward head, flexed trunk , and weight shift left  PALPATION: TTP R quad  LOWER EXTREMITY ROM: WFL R hip and ankle, L hip, knee, ankle  Active ROM Right eval Left eval R 01/11/23  Hip flexion     Hip extension     Hip abduction     Hip adduction     Hip internal rotation     Hip external rotation     Knee flexion 87  A94/P97  Knee extension 11  8  Ankle dorsiflexion     Ankle plantarflexion     Ankle inversion     Ankle eversion      (Blank rows = not tested)  LOWER EXTREMITY MMT: LLE 4/5, R knee 3+/5, HIP and ankle 4-/5   FUNCTIONAL TESTS:  5 times sit to stand: TBD Timed up and go (TUG): TBD  GAIT: Distance walked: In clinic distances Assistive device utilized: Walker - 2 wheeled Level of assistance: Modified independence Comments: Decreased WB, decreased stance on R, flexed posture   TODAY'S TREATMENT:                                                                                                                               DATE: 01/18/23 NuStep L5 x 6 min  R knee PROM with end range holds  RLE LAQ 2lb 2x15 HS  curls green 2x15 S2S 2x10 Gait w/ SPC 10ft  Leg press 20lb x10, 40lb x10, RLE 20lb x10 then 30lb x10  01/13/23 NuStep L5 x 6 minutes Seated knee flexion, 35#, BLE, 2 x 10 reps, RLE 15#, 2 x 10 reps Seated knee ext, 5#, BLE, 2 x 10 reps RLE eccentric ext, 5#, 2 x 10 R knee flexion A/P- 108 Standing on Airex- static, lateral and ant/post weight shifts, marching, minimizing UE support Fitter press 2 blue bands, 3 x 10 with power. B side step on Airex plank in parallel bars. Able to release hands about 80% of the time. 3 x each way Ambulation with SPC, 1 x 100', then B side stepping x 10 each, S.  01/11/23 NuStep L6 x 6 minutes. Supine knee press 5 x 5 sec holds Supine heel slides, SLR, SLR with hip ER x 10 each Seated A and PROM for R knee flex Seated long kicks and knee flex against G Tband, 10 reps each Seated clamshells with G tband Mini squats x10 reps of each exercise. Ambulation with SPC, 2 x 80'. Patient prefers to hold cane in R hand, observed and demonstrates good technique, does not lean on cane, step through gait.  01/07/23 NuStep L 5 x 6 min S2S 3x5 R knee PROM with end range holds  LAQ 2lb 2x10 RLE  Hamstring curls blue 2x12 Fitter press 2 blue RLE 2x15 Gait 18ft w/ SPV cue to increase R step length and heel strike Standing march 2lb 2x10  01/05/23 NuStep L 5 x 6 min R knee PROM with eng range holds S2S 3x5  LAQ RLE 2x15 RLE HS curls green 2x10 HS Curls green 2x15 Side steps length of mat table  Alt 4 in box taps with SPC 2x5 LOB x2  12/30/22:therex: Nustep level  5, x 6 min Measured R knee flexion 100, ext in supine -10 Seated long arc quads, emphasis on eccentrics, therapist assisted with concentric terminal extension Standing for terminal R knee extension against green theraband 15x Forward step ups onto 2" step within walker 15x Seated for R LE  hip and knee drivers into physioball, therapist providing some resistance Supine for SLR R, 10x, measured lage 20 degrees. Good quads contraction noted Seated R ankle plantarflexion against blue theraband 20x  12/28/22 NuStep L5 x 6 minutes Supine RLE strengthening-knee press, SAQ, SLR, HS x 10 each Seated LAQ, active knee flex against G tband x 10 each AAROM for R knee flexion R kn [id number] degrees Step ups onto 4" step, leading with RLE x 10, BUE support on RW, encouraged to minimize UE support. Ambulation with RW x 130', VC to stand up and avoid WB through arms  12/23/22 NuStep L5 x 6 min PROM/stretching/patellar mobs R knee Quad sets x10 LAQs 1lb RLE 2x10 HS curls green t-band RLE 2x10 STS x10; x5 Fitter presses 2 blue bands 2x15 Standing marches holding onto WC 2x10 Education   PATIENT EDUCATION:  Education details: POC, HEP Person educated: Patient Education method: Medical illustrator Education comprehension: verbalized understanding and returned demonstration  HOME EXERCISE PROGRAM: [email or web address]  ASSESSMENT:  CLINICAL IMPRESSION: Patient continues to progress well. Increased strength challenges with the use of leg press.  Cue needed to hole contraction with LAQ. Verbal and tactile cues needed for LE positioning during sit to stands. Pt would benefit from skilled PT services to increase functional strength and endurance.  OBJECTIVE IMPAIRMENTS: Abnormal gait, decreased activity tolerance, decreased balance, decreased endurance, decreased mobility, difficulty walking, decreased  ROM, decreased strength, impaired flexibility, improper body mechanics, and pain.   ACTIVITY LIMITATIONS: lifting, bending, sitting, squatting, sleeping, stairs, and locomotion level  PARTICIPATION LIMITATIONS: meal prep, cleaning, laundry, driving, shopping, and community activity  PERSONAL FACTORS: Past/current experiences are also affecting patient's functional outcome.   REHAB  POTENTIAL: Good  CLINICAL DECISION MAKING: Evolving/moderate complexity  EVALUATION COMPLEXITY: Moderate   GOALS: Goals reviewed with patient? Yes  SHORT TERM GOALS: Target date: 01/02/23 I with basic HEP Baseline: Goal status: 01/11/23 met  LONG TERM GOALS: Target date: 03/15/23  I with final HEP Baseline:  Goal status: INITIAL  2.  Complete 5 x STS in < 12 sec Baseline: TBD Goal status: INITIAL  3.  Complete Tug in < 12 sec, LRAD Baseline: TBD Goal status: INITIAL  4.  Increase R knee ROM from 0-120 Baseline: 11-87 AROM Goal status: 01/11/23-7-97 ongoing  5.  Ambulation x at least 400' on level and unlevel surfaces, MI, with LRAD, equal WB through B LE. Baseline: 80', RW, slow, Decreased WB through RLE Goal status: INITIAL  6.  Increase R knee strength to at least 4+/5 Baseline: 3+/5 Goal status: INITIAL  PLAN:  PT FREQUENCY: 3x/week  PT DURATION: 12 weeks  PLANNED INTERVENTIONS: Therapeutic exercises, Therapeutic activity, Neuromuscular re-education, Balance training, Gait training, Patient/Family education, Self Care, Joint mobilization, Stair training, Cryotherapy, Moist heat, Vasopneumatic device, and Manual therapy  PLAN FOR NEXT SESSION: continue with strength and ROM training   Oley Balm DPT 01/18/23 2:32 PM

## 2023-01-20 ENCOUNTER — Ambulatory Visit (INDEPENDENT_AMBULATORY_CARE_PROVIDER_SITE_OTHER): Payer: Self-pay | Admitting: PHYSICIAN ASSISTANT

## 2023-01-20 ENCOUNTER — Encounter: Payer: Self-pay | Admitting: Physical Therapy

## 2023-01-20 ENCOUNTER — Ambulatory Visit: Payer: Medicare HMO | Admitting: Physical Therapy

## 2023-01-20 DIAGNOSIS — Z96651 Presence of right artificial knee joint: Secondary | ICD-10-CM | POA: Diagnosis not present

## 2023-01-20 DIAGNOSIS — M25661 Stiffness of right knee, not elsewhere classified: Secondary | ICD-10-CM | POA: Diagnosis not present

## 2023-01-20 DIAGNOSIS — R262 Difficulty in walking, not elsewhere classified: Secondary | ICD-10-CM

## 2023-01-20 DIAGNOSIS — M6281 Muscle weakness (generalized): Secondary | ICD-10-CM

## 2023-01-20 NOTE — Therapy (Addendum)
OUTPATIENT PHYSICAL THERAPY LOWER EXTREMITY TREATMENT  Progress Note Reporting Period 12/21/22 to 01/20/23  See note below for Objective Data and Assessment of Progress/Goals.      Patient Name: Tanner [name] MRN: [id number] DOB:July 06, 1945, 77 y.o., male Today's Date: 01/20/2023  END OF SESSION:  PT End of Session - 01/20/23 1421     Visit Number 10    Date for PT Re-Evaluation 03/15/23    PT Start Time 1422    PT Stop Time 1507    PT Time Calculation (min) 45 min    Activity Tolerance Patient tolerated treatment well    Behavior During Therapy WFL for tasks assessed/performed                 Past Medical History:  Diagnosis Date   Acute kidney failure (HCC)    Anxiety    Arthritis    Back pain    BPH (benign prostatic hypertrophy)    CHF (congestive heart failure) (HCC)    Clostridium difficile infection    Clotting disorder (HCC)    Depression    Diabetes (HCC) 12/11/2016   type 2    DVT (deep venous thrombosis) (HCC)    DVT of deep femoral vein, right (HCC) 06/29/2018   Edema, lower extremity    High cholesterol    Hypertension    Joint pain    Low back pain potentially associated with spinal stenosis    Neuromuscular disorder (HCC)    Neuropathy of lower extremity    bilateral   OSA (obstructive sleep apnea)    pt denies    Osteoarthritis    PE (pulmonary embolism)    Pneumonia    hx of x 2    Ventral hernia    Past Surgical History:  Procedure Laterality Date   CARDIOVERSION N/A 08/19/2022   Procedure: CARDIOVERSION;  Surgeon: Meriam Sprague, MD;  Location: [facility] ENDOSCOPY;  Service: Cardiovascular;  Laterality: N/A;   EYE SURGERY     HERNIA REPAIR  1999   TOTAL KNEE ARTHROPLASTY Left 08/26/2020   Procedure: LEFT TOTAL KNEE ARTHROPLASTY;  Surgeon: Gean Birchwood, MD;  Location: [facility];  Service: Orthopedics;  Laterality: Left;   TOTAL KNEE ARTHROPLASTY Right 12/15/2022   Procedure: TOTAL KNEE ARTHROPLASTY;  Surgeon: Durene Romans, MD;  Location:  [facility];  Service: Orthopedics;  Laterality: Right;   Patient Active Problem List   Diagnosis Date Noted   S/P total knee arthroplasty, right 12/15/2022   Acute respiratory failure with hypoxemia (HCC) 10/22/2022   Class 2 obesity 08/19/2022   Paroxysmal atrial fibrillation (HCC) 08/19/2022   Jaw pain 06/24/2022   Left hand pain 05/29/2022   Chronic systolic heart failure (HCC) 02/23/2022   Moderate mitral regurgitation 02/23/2022   Leg swelling 01/29/2022   ED (erectile dysfunction) 01/13/2021   Cyst, dermoid, scalp and neck 01/13/2021   Chronic kidney disease (CKD), stage III (moderate) (HCC) 12/13/2019   Carpal tunnel syndrome 12/05/2019   Elevated creatine kinase 11/15/2019   Aortic atherosclerosis (HCC) 11/15/2019   Recurrent Clostridium difficile diarrhea 05/28/2017   Peripheral neuropathy 03/17/2017   Diabetes (HCC) 12/11/2016   Allergic rhinitis 12/11/2016   Degenerative arthritis of left knee 01/30/2016   Morbid obesity (HCC) 01/30/2016   Routine general medical examination at a health care facility 04/20/2014   History of pulmonary embolus (PE) 12/19/2013   Anxiety state 03/29/2007   Depression 03/29/2007   OSA (obstructive sleep apnea) 03/29/2007   Unspecified glaucoma 03/29/2007   BPH (benign prostatic hyperplasia)  03/29/2007   OA (osteoarthritis) of knee 03/29/2007   Essential hypertension 03/29/2007   History of Bell's palsy 03/29/2007    PCP: Hillard Danker a, MD  REFERRING PROVIDER: Durene Romans, MD  REFERRING DIAG:  Diagnosis  [phone] (ICD-10-CM) - S/P total knee arthroplasty    THERAPY DIAG:  Stiffness of right knee, not elsewhere classified  Difficulty in walking, not elsewhere classified  Muscle weakness (generalized)  Status post total right knee replacement  Rationale for Evaluation and Treatment: Rehabilitation  ONSET DATE: 12/16/22  SUBJECTIVE:   SUBJECTIVE STATEMENT: "No good, my legs are tired"  PERTINENT HISTORY: 77 yo  male s/p R TKA on 12/15/22. PMH: PE, HTN, DVT, DM, depression, anxiety, OA, L TKA 2022   PAIN:  Are you having pain? Yes: NPRS scale: 2/10 Pain location: R knee and R quad Pain description: sharp, lingering. Aggravating factors: Standing up Relieving factors: rest  PRECAUTIONS: None  WEIGHT BEARING RESTRICTIONS: Yes WBAT  FALLS:  Has patient fallen in last 6 months? Yes. Number of falls several- he tripped once and thinks the last one was caused by his knee giving way.  LIVING ENVIRONMENT: Lives with: lives alone Lives in: House/apartment Stairs: Yes: External: 4 steps; on right going up Has following equipment at home: Single point cane and Walker - 2 wheeled  OCCUPATION: Retired, plays golf  PLOF: Independent with household mobility with device  PATIENT GOALS: Play golf, work with [employer]  NEXT MD VISIT: July 18th  OBJECTIVE:   DIAGNOSTIC FINDINGS: N/A  PATIENT SURVEYS:  FOTO 42  COGNITION: Overall cognitive status: Within functional limits for tasks assessed     SENSATION: Has PN in B hands and feet  EDEMA:  R LE edema noted  POSTURE: rounded shoulders, forward head, flexed trunk , and weight shift left  PALPATION: TTP R quad  LOWER EXTREMITY ROM: WFL R hip and ankle, L hip, knee, ankle  Active ROM Right eval Left eval R 01/11/23 Right  AROM 01/20/23  Hip flexion      Hip extension      Hip abduction      Hip adduction      Hip internal rotation      Hip external rotation      Knee flexion 87  A94/P97 96  Knee extension 11  8 7   Ankle dorsiflexion      Ankle plantarflexion      Ankle inversion      Ankle eversion       (Blank rows = not tested)  LOWER EXTREMITY MMT: LLE 4/5, R knee 3+/5, HIP and ankle 4-/5   FUNCTIONAL TESTS:  5 times sit to stand: TBD Timed up and go (TUG): TBD  GAIT: Distance walked: In clinic distances Assistive device utilized: Walker - 2 wheeled Level of assistance: Modified independence Comments: Decreased  WB, decreased stance on R, flexed posture   TODAY'S TREATMENT:  DATE: 01/20/23 NuStep L4 x 6 min LE only  Checked Goals   Gait 4 laps ~ 488ft w/ RW cue to increase R hip flexion and step length LAQ RLE 2lb 2x10 S2S slightly elevated mat no Ue2x10 R knee PROM   01/18/23 NuStep L5 x 6 min  R knee PROM with end range holds  RLE LAQ 2lb 2x15 HS curls green 2x15 S2S 2x10 Gait w/ SPC 29ft  Leg press 20lb x10, 40lb x10, RLE 20lb x10 then 30lb x10  01/13/23 NuStep L5 x 6 minutes Seated knee flexion, 35#, BLE, 2 x 10 reps, RLE 15#, 2 x 10 reps Seated knee ext, 5#, BLE, 2 x 10 reps RLE eccentric ext, 5#, 2 x 10 R knee flexion A/P- 108 Standing on Airex- static, lateral and ant/post weight shifts, marching, minimizing UE support Fitter press 2 blue bands, 3 x 10 with power. B side step on Airex plank in parallel bars. Able to release hands about 80% of the time. 3 x each way Ambulation with SPC, 1 x 100', then B side stepping x 10 each, S.  01/11/23 NuStep L6 x 6 minutes. Supine knee press 5 x 5 sec holds Supine heel slides, SLR, SLR with hip ER x 10 each Seated A and PROM for R knee flex Seated long kicks and knee flex against G Tband, 10 reps each Seated clamshells with G tband Mini squats x10 reps of each exercise. Ambulation with SPC, 2 x 80'. Patient prefers to hold cane in R hand, observed and demonstrates good technique, does not lean on cane, step through gait.  01/07/23 NuStep L 5 x 6 min S2S 3x5 R knee PROM with end range holds  LAQ 2lb 2x10 RLE  Hamstring curls blue 2x12 Fitter press 2 blue RLE 2x15 Gait 73ft w/ SPV cue to increase R step length and heel strike Standing march 2lb 2x10  01/05/23 NuStep L 5 x 6 min R knee PROM with eng range holds S2S 3x5  LAQ RLE 2x15 RLE HS curls green 2x10 HS Curls green 2x15 Side steps length of mat table   Alt 4 in box taps with SPC 2x5 LOB x2  12/30/22:therex: Nustep level  5, x 6 min Measured R knee flexion 100, ext in supine -10 Seated long arc quads, emphasis on eccentrics, therapist assisted with concentric terminal extension Standing for terminal R knee extension against green theraband 15x Forward step ups onto 2" step within walker 15x Seated for R LE hip and knee drivers into physioball, therapist providing some resistance Supine for SLR R, 10x, measured lage 20 degrees. Good quads contraction noted Seated R ankle plantarflexion against blue theraband 20x  12/28/22 NuStep L5 x 6 minutes Supine RLE strengthening-knee press, SAQ, SLR, HS x 10 each Seated LAQ, active knee flex against G tband x 10 each AAROM for R knee flexion R kn AAROM12-98 degrees Step ups onto 4" step, leading with RLE x 10, BUE support on RW, encouraged to minimize UE support. Ambulation with RW x 130', VC to stand up and avoid WB through arms  12/23/22 NuStep L5 x 6 min PROM/stretching/patellar mobs R knee Quad sets x10 LAQs 1lb RLE 2x10 HS curls green t-band RLE 2x10 STS x10; x5 Fitter presses 2 blue bands 2x15 Standing marches holding onto WC 2x10 Education   PATIENT EDUCATION:  Education details: POC, HEP Person educated: Patient Education method: Medical illustrator Education comprehension: verbalized understanding and returned demonstration  HOME EXERCISE PROGRAM: [email or web address]  ASSESSMENT:  CLINICAL  IMPRESSION: Patient continues to progress well. Verbal and tactile cues needed for LE positioning during sit to stands. He has progressed towards all LTG's partly meeting some. Some R knee ROM limitations. Pt would benefit from skilled PT services to increase functional strength and endurance.  OBJECTIVE IMPAIRMENTS: Abnormal gait, decreased activity tolerance, decreased balance, decreased endurance, decreased mobility, difficulty walking, decreased ROM, decreased strength, impaired  flexibility, improper body mechanics, and pain.   ACTIVITY LIMITATIONS: lifting, bending, sitting, squatting, sleeping, stairs, and locomotion level  PARTICIPATION LIMITATIONS: meal prep, cleaning, laundry, driving, shopping, and community activity  PERSONAL FACTORS: Past/current experiences are also affecting patient's functional outcome.   REHAB POTENTIAL: Good  CLINICAL DECISION MAKING: Evolving/moderate complexity  EVALUATION COMPLEXITY: Moderate   GOALS: Goals reviewed with patient? Yes  SHORT TERM GOALS: Target date: 01/02/23 I with basic HEP Baseline: Goal status: 01/11/23 met  LONG TERM GOALS: Target date: 03/15/23  I with final HEP Baseline:  Goal status: INITIAL  2.  Complete 5 x STS in < 12 sec Baseline: TBD Goal status: Partly met 10.55 sec but needed to use UE  3.  Complete Tug in < 12 sec, LRAD Baseline: TBD Goal status: Progressing 12.20 01/20/23  4.  Increase R knee ROM from 0-120 Baseline: 11-87 AROM Goal status: 01/20/23  7-96 ongoing  5.  Ambulation x at least 400' on level and unlevel surfaces, MI, with LRAD, equal WB through B LE. Baseline: 80', RW, slow, Decreased WB through RLE Goal status: Progressing 01/20/23  6.  Increase R knee strength to at least 4+/5 Baseline: 3+/5 Goal status: INITIAL  PLAN:  PT FREQUENCY: 3x/week  PT DURATION: 12 weeks  PLANNED INTERVENTIONS: Therapeutic exercises, Therapeutic activity, Neuromuscular re-education, Balance training, Gait training, Patient/Family education, Self Care, Joint mobilization, Stair training, Cryotherapy, Moist heat, Vasopneumatic device, and Manual therapy  PLAN FOR NEXT SESSION: continue with strength and ROM training   Oley Balm DPT 01/20/23 2:21 PM

## 2023-01-25 ENCOUNTER — Ambulatory Visit: Payer: Medicare HMO | Admitting: Physical Therapy

## 2023-01-25 ENCOUNTER — Encounter: Payer: Self-pay | Admitting: Physical Therapy

## 2023-01-25 DIAGNOSIS — M25661 Stiffness of right knee, not elsewhere classified: Secondary | ICD-10-CM | POA: Diagnosis not present

## 2023-01-25 DIAGNOSIS — M6281 Muscle weakness (generalized): Secondary | ICD-10-CM

## 2023-01-25 DIAGNOSIS — R262 Difficulty in walking, not elsewhere classified: Secondary | ICD-10-CM | POA: Diagnosis not present

## 2023-01-25 DIAGNOSIS — Z96651 Presence of right artificial knee joint: Secondary | ICD-10-CM | POA: Diagnosis not present

## 2023-01-25 NOTE — Therapy (Signed)
OUTPATIENT PHYSICAL THERAPY LOWER EXTREMITY TREATMENT      Patient Name: Tanner [name] MRN: [id number] DOB:05-09-1946, 77 y.o., male Today's Date: 01/25/2023  END OF SESSION:  PT End of Session - 01/25/23 1436     Visit Number 11    Date for PT Re-Evaluation 03/15/23    PT Start Time 1430    PT Stop Time 1515    PT Time Calculation (min) 45 min    Activity Tolerance Patient tolerated treatment well    Behavior During Therapy WFL for tasks assessed/performed                 Past Medical History:  Diagnosis Date   Acute kidney failure (HCC)    Anxiety    Arthritis    Back pain    BPH (benign prostatic hypertrophy)    CHF (congestive heart failure) (HCC)    Clostridium difficile infection    Clotting disorder (HCC)    Depression    Diabetes (HCC) 12/11/2016   type 2    DVT (deep venous thrombosis) (HCC)    DVT of deep femoral vein, right (HCC) 06/29/2018   Edema, lower extremity    High cholesterol    Hypertension    Joint pain    Low back pain potentially associated with spinal stenosis    Neuromuscular disorder (HCC)    Neuropathy of lower extremity    bilateral   OSA (obstructive sleep apnea)    pt denies    Osteoarthritis    PE (pulmonary embolism)    Pneumonia    hx of x 2    Ventral hernia    Past Surgical History:  Procedure Laterality Date   CARDIOVERSION N/A 08/19/2022   Procedure: CARDIOVERSION;  Surgeon: Meriam Sprague, MD;  Location: [facility] ENDOSCOPY;  Service: Cardiovascular;  Laterality: N/A;   EYE SURGERY     HERNIA REPAIR  1999   TOTAL KNEE ARTHROPLASTY Left 08/26/2020   Procedure: LEFT TOTAL KNEE ARTHROPLASTY;  Surgeon: Gean Birchwood, MD;  Location: [facility] ORS;  Service: Orthopedics;  Laterality: Left;   TOTAL KNEE ARTHROPLASTY Right 12/15/2022   Procedure: TOTAL KNEE ARTHROPLASTY;  Surgeon: Durene Romans, MD;  Location: [facility] ORS;  Service: Orthopedics;  Laterality: Right;   Patient Active Problem List   Diagnosis Date Noted   S/P total  knee arthroplasty, right 12/15/2022   Acute respiratory failure with hypoxemia (HCC) 10/22/2022   Class 2 obesity 08/19/2022   Paroxysmal atrial fibrillation (HCC) 08/19/2022   Jaw pain 06/24/2022   Left hand pain 05/29/2022   Chronic systolic heart failure (HCC) 02/23/2022   Moderate mitral regurgitation 02/23/2022   Leg swelling 01/29/2022   ED (erectile dysfunction) 01/13/2021   Cyst, dermoid, scalp and neck 01/13/2021   Chronic kidney disease (CKD), stage III (moderate) (HCC) 12/13/2019   Carpal tunnel syndrome 12/05/2019   Elevated creatine kinase 11/15/2019   Aortic atherosclerosis (HCC) 11/15/2019   Recurrent Clostridium difficile diarrhea 05/28/2017   Peripheral neuropathy 03/17/2017   Diabetes (HCC) 12/11/2016   Allergic rhinitis 12/11/2016   Degenerative arthritis of left knee 01/30/2016   Morbid obesity (HCC) 01/30/2016   Routine general medical examination at a health care facility 04/20/2014   History of pulmonary embolus (PE) 12/19/2013   Anxiety state 03/29/2007   Depression 03/29/2007   OSA (obstructive sleep apnea) 03/29/2007   Unspecified glaucoma 03/29/2007   BPH (benign prostatic hyperplasia) 03/29/2007   OA (osteoarthritis) of knee 03/29/2007   Essential hypertension 03/29/2007   History of Bell's palsy  03/29/2007    PCP: Hillard Danker a, MD  REFERRING PROVIDER: Durene Romans, MD  REFERRING DIAG:  Diagnosis  [phone] (ICD-10-CM) - S/P total knee arthroplasty    THERAPY DIAG:  Stiffness of right knee, not elsewhere classified  Difficulty in walking, not elsewhere classified  Muscle weakness (generalized)  Status post total right knee replacement  Rationale for Evaluation and Treatment: Rehabilitation  ONSET DATE: 12/16/22  SUBJECTIVE:   SUBJECTIVE STATEMENT: "Tired, I started not to come"  PERTINENT HISTORY: 77 yo male s/p R TKA on 12/15/22. PMH: PE, HTN, DVT, DM, depression, anxiety, OA, L TKA 2022   PAIN:  Are you having pain?  Yes: NPRS scale: 0/10 Pain location: R knee and R quad Pain description: sharp, lingering. Aggravating factors: Standing up Relieving factors: rest  PRECAUTIONS: None  WEIGHT BEARING RESTRICTIONS: Yes WBAT  FALLS:  Has patient fallen in last 6 months? Yes. Number of falls several- he tripped once and thinks the last one was caused by his knee giving way.  LIVING ENVIRONMENT: Lives with: lives alone Lives in: House/apartment Stairs: Yes: External: 4 steps; on right going up Has following equipment at home: Single point cane and Walker - 2 wheeled  OCCUPATION: Retired, plays golf  PLOF: Independent with household mobility with device  PATIENT GOALS: Play golf, work with [employer]  NEXT MD VISIT: July 18th  OBJECTIVE:   DIAGNOSTIC FINDINGS: N/A  PATIENT SURVEYS:  FOTO 42  COGNITION: Overall cognitive status: Within functional limits for tasks assessed     SENSATION: Has PN in B hands and feet  EDEMA:  R LE edema noted  POSTURE: rounded shoulders, forward head, flexed trunk , and weight shift left  PALPATION: TTP R quad  LOWER EXTREMITY ROM: WFL R hip and ankle, L hip, knee, ankle  Active ROM Right eval Left eval R 01/11/23 Right  AROM 01/20/23  Hip flexion      Hip extension      Hip abduction      Hip adduction      Hip internal rotation      Hip external rotation      Knee flexion 87  A94/P97 96  Knee extension 11  8 7   Ankle dorsiflexion      Ankle plantarflexion      Ankle inversion      Ankle eversion       (Blank rows = not tested)  LOWER EXTREMITY MMT: LLE 4/5, R knee 3+/5, HIP and ankle 4-/5   FUNCTIONAL TESTS:  5 times sit to stand: TBD Timed up and go (TUG): TBD  GAIT: Distance walked: In clinic distances Assistive device utilized: Walker - 2 wheeled Level of assistance: Modified independence Comments: Decreased WB, decreased stance on R, flexed posture   TODAY'S TREATMENT:  DATE: 01/25/23 NuStep L4 x 6 min LE only  Leg press 40lb 2x10, RLE 20lb 2x10 HS curls 25lb 2x10 Leg Ext 10lb 2x10 S2S from slightly mat x10, x5 RLE SLR 2x10   01/20/23 NuStep L4 x 6 min LE only  Checked Goals   Gait 4 laps ~ 486ft w/ RW cue to increase R hip flexion and step length LAQ RLE 2lb 2x10 S2S slightly elevated mat no Ue2x10 R knee PROM   01/18/23 NuStep L5 x 6 min  R knee PROM with end range holds  RLE LAQ 2lb 2x15 HS curls green 2x15 S2S 2x10 Gait w/ SPC 15ft  Leg press 20lb x10, 40lb x10, RLE 20lb x10 then 30lb x10  01/13/23 NuStep L5 x 6 minutes Seated knee flexion, 35#, BLE, 2 x 10 reps, RLE 15#, 2 x 10 reps Seated knee ext, 5#, BLE, 2 x 10 reps RLE eccentric ext, 5#, 2 x 10 R knee flexion A/P- 108 Standing on Airex- static, lateral and ant/post weight shifts, marching, minimizing UE support Fitter press 2 blue bands, 3 x 10 with power. B side step on Airex plank in parallel bars. Able to release hands about 80% of the time. 3 x each way Ambulation with SPC, 1 x 100', then B side stepping x 10 each, S.  01/11/23 NuStep L6 x 6 minutes. Supine knee press 5 x 5 sec holds Supine heel slides, SLR, SLR with hip ER x 10 each Seated A and PROM for R knee flex Seated long kicks and knee flex against G Tband, 10 reps each Seated clamshells with G tband Mini squats x10 reps of each exercise. Ambulation with SPC, 2 x 80'. Patient prefers to hold cane in R hand, observed and demonstrates good technique, does not lean on cane, step through gait.  01/07/23 NuStep L 5 x 6 min S2S 3x5 R knee PROM with end range holds  LAQ 2lb 2x10 RLE  Hamstring curls blue 2x12 Fitter press 2 blue RLE 2x15 Gait 28ft w/ SPV cue to increase R step length and heel strike Standing march 2lb 2x10  01/05/23 NuStep L 5 x 6 min R knee PROM with eng range holds S2S 3x5  LAQ RLE 2x15 RLE HS curls green 2x10 HS Curls green  2x15 Side steps length of mat table  Alt 4 in box taps with SPC 2x5 LOB x2  12/30/22:therex: Nustep level  5, x 6 min Measured R knee flexion 100, ext in supine -10 Seated long arc quads, emphasis on eccentrics, therapist assisted with concentric terminal extension Standing for terminal R knee extension against green theraband 15x Forward step ups onto 2" step within walker 15x Seated for R LE hip and knee drivers into physioball, therapist providing some resistance Supine for SLR R, 10x, measured lage 20 degrees. Good quads contraction noted Seated R ankle plantarflexion against blue theraband 20x  12/28/22 NuStep L5 x 6 minutes Supine RLE strengthening-knee press, SAQ, SLR, HS x 10 each Seated LAQ, active knee flex against G tband x 10 each AAROM for R knee flexion R kn AAROM12-98 degrees Step ups onto 4" step, leading with RLE x 10, BUE support on RW, encouraged to minimize UE support. Ambulation with RW x 130', VC to stand up and avoid WB through arms  12/23/22 NuStep L5 x 6 min PROM/stretching/patellar mobs R knee Quad sets x10 LAQs 1lb RLE 2x10 HS curls green t-band RLE 2x10 STS x10; x5 Fitter presses 2 blue bands 2x15 Standing marches holding onto [facility]  2x10 Education   PATIENT EDUCATION:  Education details: POC, HEP Person educated: Patient Education method: Medical illustrator Education comprehension: verbalized understanding and returned demonstration  HOME EXERCISE PROGRAM: [email or web address]  ASSESSMENT:  CLINICAL IMPRESSION: Patient continues to progress well, he enters with reports fof fatigue. Verbal and tactile cues needed for LE positioning during sit to stands. Progressed pt to more machine level interventions without issue. Cue for full ROM needed with hamstring curls. Some extensor lag with LAQ.  Pt would benefit from skilled PT services to increase functional strength and endurance.  OBJECTIVE IMPAIRMENTS: Abnormal gait, decreased activity tolerance,  decreased balance, decreased endurance, decreased mobility, difficulty walking, decreased ROM, decreased strength, impaired flexibility, improper body mechanics, and pain.   ACTIVITY LIMITATIONS: lifting, bending, sitting, squatting, sleeping, stairs, and locomotion level  PARTICIPATION LIMITATIONS: meal prep, cleaning, laundry, driving, shopping, and community activity  PERSONAL FACTORS: Past/current experiences are also affecting patient's functional outcome.   REHAB POTENTIAL: Good  CLINICAL DECISION MAKING: Evolving/moderate complexity  EVALUATION COMPLEXITY: Moderate   GOALS: Goals reviewed with patient? Yes  SHORT TERM GOALS: Target date: 01/02/23 I with basic HEP Baseline: Goal status: 01/11/23 met  LONG TERM GOALS: Target date: 03/15/23  I with final HEP Baseline:  Goal status: INITIAL  2.  Complete 5 x STS in < 12 sec Baseline: TBD Goal status: Partly met 10.55 sec but needed to use UE  3.  Complete Tug in < 12 sec, LRAD Baseline: TBD Goal status: Progressing 12.20 01/20/23  4.  Increase R knee ROM from 0-120 Baseline: 11-87 AROM Goal status: 01/20/23  7-96 ongoing  5.  Ambulation x at least 400' on level and unlevel surfaces, MI, with LRAD, equal WB through B LE. Baseline: 80', RW, slow, Decreased WB through RLE Goal status: Progressing 01/20/23  6.  Increase R knee strength to at least 4+/5 Baseline: 3+/5 Goal status: INITIAL  PLAN:  PT FREQUENCY: 3x/week  PT DURATION: 12 weeks  PLANNED INTERVENTIONS: Therapeutic exercises, Therapeutic activity, Neuromuscular re-education, Balance training, Gait training, Patient/Family education, Self Care, Joint mobilization, Stair training, Cryotherapy, Moist heat, Vasopneumatic device, and Manual therapy  PLAN FOR NEXT SESSION: continue with strength and ROM training   Oley Balm DPT 01/25/23 2:37 PM

## 2023-01-27 ENCOUNTER — Ambulatory Visit: Payer: Medicare HMO | Admitting: Physical Therapy

## 2023-01-27 ENCOUNTER — Encounter: Payer: Self-pay | Admitting: Physical Therapy

## 2023-01-27 DIAGNOSIS — M25661 Stiffness of right knee, not elsewhere classified: Secondary | ICD-10-CM

## 2023-01-27 DIAGNOSIS — Z96651 Presence of right artificial knee joint: Secondary | ICD-10-CM | POA: Diagnosis not present

## 2023-01-27 DIAGNOSIS — M6281 Muscle weakness (generalized): Secondary | ICD-10-CM | POA: Diagnosis not present

## 2023-01-27 DIAGNOSIS — R262 Difficulty in walking, not elsewhere classified: Secondary | ICD-10-CM | POA: Diagnosis not present

## 2023-01-27 NOTE — Therapy (Signed)
OUTPATIENT PHYSICAL THERAPY LOWER EXTREMITY TREATMENT      Patient Name: Tanner [name] MRN: [id number] DOB:[birth year], 77 y.o., male Today's Date: 01/27/2023  END OF SESSION:  PT End of Session - 01/27/23 1433     Visit Number 12    Date for PT Re-Evaluation 03/15/23    PT Start Time 1430    PT Stop Time 1515    PT Time Calculation (min) 45 min    Activity Tolerance Patient tolerated treatment well    Behavior During Therapy WFL for tasks assessed/performed                 Past Medical History:  Diagnosis Date   Acute kidney failure (HCC)    Anxiety    Arthritis    Back pain    BPH (benign prostatic hypertrophy)    CHF (congestive heart failure) (HCC)    Clostridium difficile infection    Clotting disorder (HCC)    Depression    Diabetes (HCC) 12/11/2016   type 2    DVT (deep venous thrombosis) (HCC)    DVT of deep femoral vein, right (HCC) 06/29/2018   Edema, lower extremity    High cholesterol    Hypertension    Joint pain    Low back pain potentially associated with spinal stenosis    Neuromuscular disorder (HCC)    Neuropathy of lower extremity    bilateral   OSA (obstructive sleep apnea)    pt denies    Osteoarthritis    PE (pulmonary embolism)    Pneumonia    hx of x 2    Ventral hernia    Past Surgical History:  Procedure Laterality Date   CARDIOVERSION N/A 08/19/2022   Procedure: CARDIOVERSION;  Surgeon: Meriam Sprague, MD;  Location: [facility] ENDOSCOPY;  Service: Cardiovascular;  Laterality: N/A;   EYE SURGERY     HERNIA REPAIR  1999   TOTAL KNEE ARTHROPLASTY Left 08/26/2020   Procedure: LEFT TOTAL KNEE ARTHROPLASTY;  Surgeon: Gean Birchwood, MD;  Location: [facility];  Service: Orthopedics;  Laterality: Left;   TOTAL KNEE ARTHROPLASTY Right 12/15/2022   Procedure: TOTAL KNEE ARTHROPLASTY;  Surgeon: Durene Romans, MD;  Location: [facility] ORS;  Service: Orthopedics;  Laterality: Right;   Patient Active Problem List   Diagnosis Date Noted   S/P total  knee arthroplasty, right 12/15/2022   Acute respiratory failure with hypoxemia (HCC) 10/22/2022   Class 2 obesity 08/19/2022   Paroxysmal atrial fibrillation (HCC) 08/19/2022   Jaw pain 06/24/2022   Left hand pain 05/29/2022   Chronic systolic heart failure (HCC) 02/23/2022   Moderate mitral regurgitation 02/23/2022   Leg swelling 01/29/2022   ED (erectile dysfunction) 01/13/2021   Cyst, dermoid, scalp and neck 01/13/2021   Chronic kidney disease (CKD), stage III (moderate) (HCC) 12/13/2019   Carpal tunnel syndrome 12/05/2019   Elevated creatine kinase 11/15/2019   Aortic atherosclerosis (HCC) 11/15/2019   Recurrent Clostridium difficile diarrhea 05/28/2017   Peripheral neuropathy 03/17/2017   Diabetes (HCC) 12/11/2016   Allergic rhinitis 12/11/2016   Degenerative arthritis of left knee 01/30/2016   Morbid obesity (HCC) 01/30/2016   Routine general medical examination at a health care facility 04/20/2014   History of pulmonary embolus (PE) 12/19/2013   Anxiety state 03/29/2007   Depression 03/29/2007   OSA (obstructive sleep apnea) 03/29/2007   Unspecified glaucoma 03/29/2007   BPH (benign prostatic hyperplasia) 03/29/2007   OA (osteoarthritis) of knee 03/29/2007   Essential hypertension 03/29/2007   History of Bell's palsy  03/29/2007    PCP: Hillard Danker a, MD  REFERRING PROVIDER: Durene Romans, MD  REFERRING DIAG:  Diagnosis  [phone] (ICD-10-CM) - S/P total knee arthroplasty    THERAPY DIAG:  Stiffness of right knee, not elsewhere classified  Difficulty in walking, not elsewhere classified  Muscle weakness (generalized)  Status post total right knee replacement  Rationale for Evaluation and Treatment: Rehabilitation  ONSET DATE: 12/16/22  SUBJECTIVE:   SUBJECTIVE STATEMENT: "Pretty good"  PERTINENT HISTORY: 77 yo male s/p R TKA on 12/15/22. PMH: PE, HTN, DVT, DM, depression, anxiety, OA, L TKA 2022   PAIN:  Are you having pain? Yes: NPRS scale:  0/10 Pain location: R knee and R quad Pain description: sharp, lingering. Aggravating factors: Standing up Relieving factors: rest  PRECAUTIONS: None  WEIGHT BEARING RESTRICTIONS: Yes WBAT  FALLS:  Has patient fallen in last 6 months? Yes. Number of falls several- he tripped once and thinks the last one was caused by his knee giving way.  LIVING ENVIRONMENT: Lives with: lives alone Lives in: House/apartment Stairs: Yes: External: 4 steps; on right going up Has following equipment at home: Single point cane and Walker - 2 wheeled  OCCUPATION: Retired, plays golf  PLOF: Independent with household mobility with device  PATIENT GOALS: Play golf, work with [employer]  NEXT MD VISIT: July 18th  OBJECTIVE:   DIAGNOSTIC FINDINGS: N/A  PATIENT SURVEYS:  FOTO 42  COGNITION: Overall cognitive status: Within functional limits for tasks assessed     SENSATION: Has PN in B hands and feet  EDEMA:  R LE edema noted  POSTURE: rounded shoulders, forward head, flexed trunk , and weight shift left  PALPATION: TTP R quad  LOWER EXTREMITY ROM: WFL R hip and ankle, L hip, knee, ankle  Active ROM Right eval Left eval R 01/11/23 Right  AROM 01/20/23  Hip flexion      Hip extension      Hip abduction      Hip adduction      Hip internal rotation      Hip external rotation      Knee flexion 87  A94/P97 96  Knee extension 11  8 7   Ankle dorsiflexion      Ankle plantarflexion      Ankle inversion      Ankle eversion       (Blank rows = not tested)  LOWER EXTREMITY MMT: LLE 4/5, R knee 3+/5, HIP and ankle 4-/5   FUNCTIONAL TESTS:  5 times sit to stand: TBD Timed up and go (TUG): TBD  GAIT: Distance walked: In clinic distances Assistive device utilized: Walker - 2 wheeled Level of assistance: Modified independence Comments: Decreased WB, decreased stance on R, flexed posture   TODAY'S TREATMENT:                                                                                                                               DATE: 01/27/23  NuStep L4 x 7 min LE only  Gait 2 laps w/ SPC cues needed to increase stpe length with RLE  Stair negotiation 4in two rails alt pattern 15 steps  S2S 4x5 Hs curls RLE blue 2x12    01/25/23 NuStep L4 x 6 min LE only  Leg press 40lb 2x10, RLE 20lb 2x10 HS curls 25lb 2x10 Leg Ext 10lb 2x10 S2S from slightly mat x10, x5 RLE SLR 2x10   01/20/23 NuStep L4 x 6 min LE only  Checked Goals   Gait 4 laps ~ 480ft w/ RW cue to increase R hip flexion and step length LAQ RLE 2lb 2x10 S2S slightly elevated mat no Ue2x10 R knee PROM   01/18/23 NuStep L5 x 6 min  R knee PROM with end range holds  RLE LAQ 2lb 2x15 HS curls green 2x15 S2S 2x10 Gait w/ SPC 21ft  Leg press 20lb x10, 40lb x10, RLE 20lb x10 then 30lb x10  01/13/23 NuStep L5 x 6 minutes Seated knee flexion, 35#, BLE, 2 x 10 reps, RLE 15#, 2 x 10 reps Seated knee ext, 5#, BLE, 2 x 10 reps RLE eccentric ext, 5#, 2 x 10 R knee flexion A/P- 108 Standing on Airex- static, lateral and ant/post weight shifts, marching, minimizing UE support Fitter press 2 blue bands, 3 x 10 with power. B side step on Airex plank in parallel bars. Able to release hands about 80% of the time. 3 x each way Ambulation with SPC, 1 x 100', then B side stepping x 10 each, S.  01/11/23 NuStep L6 x 6 minutes. Supine knee press 5 x 5 sec holds Supine heel slides, SLR, SLR with hip ER x 10 each Seated A and PROM for R knee flex Seated long kicks and knee flex against G Tband, 10 reps each Seated clamshells with G tband Mini squats x10 reps of each exercise. Ambulation with SPC, 2 x 80'. Patient prefers to hold cane in R hand, observed and demonstrates good technique, does not lean on cane, step through gait.  01/07/23 NuStep L 5 x 6 min S2S 3x5 R knee PROM with end range holds  LAQ 2lb 2x10 RLE  Hamstring curls blue 2x12 Fitter press 2 blue RLE 2x15 Gait 52ft w/ SPV cue to increase R step  length and heel strike Standing march 2lb 2x10  01/05/23 NuStep L 5 x 6 min R knee PROM with eng range holds S2S 3x5  LAQ RLE 2x15 RLE HS curls green 2x10 HS Curls green 2x15 Side steps length of mat table  Alt 4 in box taps with SPC 2x5 LOB x2  12/30/22:therex: Nustep level  5, x 6 min Measured R knee flexion 100, ext in supine -10 Seated long arc quads, emphasis on eccentrics, therapist assisted with concentric terminal extension Standing for terminal R knee extension against green theraband 15x Forward step ups onto 2" step within walker 15x Seated for R LE hip and knee drivers into physioball, therapist providing some resistance Supine for SLR R, 10x, measured lage 20 degrees. Good quads contraction noted Seated R ankle plantarflexion against blue theraband 20x  12/28/22 NuStep L5 x 6 minutes Supine RLE strengthening-knee press, SAQ, SLR, HS x 10 each Seated LAQ, active knee flex against G tband x 10 each AAROM for R knee flexion R kn AAROM12-98 degrees Step ups onto 4" step, leading with RLE x 10, BUE support on RW, encouraged to minimize UE support. Ambulation with RW x 130', VC to stand up and avoid  WB through arms  12/23/22 NuStep L5 x 6 min PROM/stretching/patellar mobs R knee Quad sets x10 LAQs 1lb RLE 2x10 HS curls green t-band RLE 2x10 STS x10; x5 Fitter presses 2 blue bands 2x15 Standing marches holding onto WC 2x10 Education   PATIENT EDUCATION:  Education details: POC, HEP Person educated: Patient Education method: Medical illustrator Education comprehension: verbalized understanding and returned demonstration  HOME EXERCISE PROGRAM: [email or web address]  ASSESSMENT:  CLINICAL IMPRESSION: Patient continues to progress well, he enters with reports fof fatigue. Verbal and tactile cues needed for LE positioning during sit to stands, with improved ability. Progressed with functional interventions such as gait with SPC and stairs. Cue for full ROM needed  with hamstring curls. Some extensor lag with LAQ. Pt would benefit from skilled PT services to increase functional strength and endurance.  OBJECTIVE IMPAIRMENTS: Abnormal gait, decreased activity tolerance, decreased balance, decreased endurance, decreased mobility, difficulty walking, decreased ROM, decreased strength, impaired flexibility, improper body mechanics, and pain.   ACTIVITY LIMITATIONS: lifting, bending, sitting, squatting, sleeping, stairs, and locomotion level  PARTICIPATION LIMITATIONS: meal prep, cleaning, laundry, driving, shopping, and community activity  PERSONAL FACTORS: Past/current experiences are also affecting patient's functional outcome.   REHAB POTENTIAL: Good  CLINICAL DECISION MAKING: Evolving/moderate complexity  EVALUATION COMPLEXITY: Moderate   GOALS: Goals reviewed with patient? Yes  SHORT TERM GOALS: Target date: 01/02/23 I with basic HEP Baseline: Goal status: 01/11/23 met  LONG TERM GOALS: Target date: 03/15/23  I with final HEP Baseline:  Goal status: INITIAL  2.  Complete 5 x STS in < 12 sec Baseline: TBD Goal status: Partly met 10.55 sec but needed to use UE  3.  Complete Tug in < 12 sec, LRAD Baseline: TBD Goal status: Progressing 12.20 01/20/23  4.  Increase R knee ROM from 0-120 Baseline: 11-87 AROM Goal status: 01/20/23  7-96 ongoing  5.  Ambulation x at least 400' on level and unlevel surfaces, MI, with LRAD, equal WB through B LE. Baseline: 80', RW, slow, Decreased WB through RLE Goal status: Progressing 01/20/23  6.  Increase R knee strength to at least 4+/5 Baseline: 3+/5 Goal status: INITIAL  PLAN:  PT FREQUENCY: 3x/week  PT DURATION: 12 weeks  PLANNED INTERVENTIONS: Therapeutic exercises, Therapeutic activity, Neuromuscular re-education, Balance training, Gait training, Patient/Family education, Self Care, Joint mobilization, Stair training, Cryotherapy, Moist heat, Vasopneumatic device, and Manual therapy  PLAN FOR  NEXT SESSION: continue with strength and ROM training   Oley Balm DPT 01/27/23 2:34 PM

## 2023-01-28 ENCOUNTER — Ambulatory Visit: Payer: Self-pay | Admitting: Licensed Clinical Social Worker

## 2023-01-28 NOTE — Patient Outreach (Signed)
  Care Coordination  Follow Up Visit Note   01/28/2023 Name: Tanner [name] MRN: [id number] DOB: 1945-07-03  ARGUS VANDECAR is a 77 y.o. year old male who sees Myrlene Broker, MD for primary care. I spoke with  [name] by phone today.  What matters to the patients health and wellness today?  Being able to walk without his walker.  Patient recently had knee surgery and has not progressed as fast as he had hoped. His main focus at this time is his health. He has agreed to connect with the [facility].   Goals Addressed             This Visit's Progress    COMPLETED: decrease stress related to health decline       Activities and task to complete in order to accomplish goals.   Keep all upcoming appointment discussed today Continue with compliance of taking medication prescribed by Doctor You have declined counseling services at this time, you will continue with support from your church Self Support options  (continue using walker, connect with support group at church, continue to receive support from your son)  I am glad you are making great progress with your knee  I has scheduled you the RN Care Manager. She will assist you with education on managing your heath.       SDOH assessments and interventions completed:  No   Care Coordination Interventions:  Yes, provided  Interventions Today    Flowsheet Row Most Recent Value  Chronic Disease   Chronic disease during today's visit Diabetes, Hypertension (HTN), Chronic Kidney Disease/End Stage Renal Disease (ESRD)  General Interventions   General Interventions Discussed/Reviewed General Interventions Reviewed, Referral to Nurse  Mental Health Interventions   Mental Health Discussed/Reviewed Mental Health Reviewed, Grief and Loss  [active listening, emotional support, solution focused]       Follow up plan:  No further intervention required for LCSW at this time. Referral made to RN care manager for education on  chronic health conditions and care coordination support   Encounter Outcome:  Pt. Visit Completed   Sammuel Hines, LCSW Social Work Care Coordination  [facility]ie [employer] [phone]

## 2023-01-28 NOTE — Patient Instructions (Signed)
Social Work Visit Information  Thank you for taking time to visit with me today. Please don't hesitate to contact me if I can be of assistance to you.   Following are the goals we discussed today:   Goals Addressed             This Visit's Progress    COMPLETED: decrease stress related to health decline       Activities and task to complete in order to accomplish goals.   Keep all upcoming appointment discussed today Continue with compliance of taking medication prescribed by Doctor You have declined counseling services at this time, you will continue with support from your church Self Support options  (continue using walker, connect with support group at church, continue to receive support from your son)  I am glad you are making great progress with your knee  I has scheduled you the RN Care Manager. She will assist you with education on managing your heath.        No follow up scheduled with social work at this time.   Please call the care guide team at [phone] if you need to cancel or reschedule your appointment.   If you or anyone you know are experiencing a Mental Health or Behavioral Health Crisis or need someone to talk to, please call the Suicide and Crisis Lifeline: 988 call the [place] National Suicide Prevention Lifeline: 574-297-1941 or TTY: 646 470 6781 TTY 561-586-1344) to talk to a trained counselor call 1-800-273-TALK (toll free, 24 hour hotline) go to [facility] [address], [place] [phone])   Patient verbalizes understanding of instructions and care plan provided today and agrees to view in MyChart. Active MyChart status and patient understanding of how to access instructions and care plan via MyChart confirmed with patient.      Sammuel Hines, LCSW Social Work Care Coordination  [facility] [employer] [phone]

## 2023-02-01 ENCOUNTER — Other Ambulatory Visit (INDEPENDENT_AMBULATORY_CARE_PROVIDER_SITE_OTHER): Payer: Self-pay

## 2023-02-01 DIAGNOSIS — Z7189 Other specified counseling: Secondary | ICD-10-CM

## 2023-02-01 NOTE — Nursing Note (Signed)
POPULATION HEALTH    COMPLEX CARE MANAGEMENT    Chronic Care Management - CCM  Status: Enrolled  Effective Dates: 12/02/2022 - present  Responsible Staff: Alden Benjamin, RN          Patient reports that he is doing well. Patients BP has been within normal range 110-130's over 60/70's. Patient states he denies any lightheaded and dizziness. Patient denies CP and SOB. Patient did have Cardiology appointment 8/7 and canceled due to inconvenient time. Rescheduled for 10/9. Patient states urinary symptoms are the same with urgency and hesitation. Patient still on Proscar and Uroxatral. Patient will follow up with Urology in 5 more months. Patient continues to get Meals on Wheels delivered at 11:20am. He also got approved for Area of Agency Assistance and has to completed reading the packet he was sent. Patients daughter has been visiting weekly with a positive relationship.  Patient expresses no other current needs at this time. [facility] continues to follow.     Care Management Tasks Completed:  Chart review completed.  Social determinants of health reviewed and updated as needed.  Care plan reviewed/discussed/updated.  Interventions ongoing and updated as needed.  Goals addressed and updated.  Interventions ongoing and updated as needed.  Education completed.  Self management plan reviewed and updated.  See patient care coordination note.  Barriers to health, care plan, and goals reviewed.  Interventions ongoing and updated as needed.  Health maintenance/care gaps reviewed and discussed with patient.  Health maintenance/care gaps updated as applicable.  Discussed upcoming appointment dates and times.  Reinforced importance of keeping all provider appointments.  Reinforced the importance of taking all medications as prescribed.  Medical history, surgical history, hospitalizations, and medications reviewed and updated as applicable.    Ongoing and Updated Assessment Interventions:  Reviewed Cancellation of Cardiac  appointment, rescheduled to 10/9.   Confirmed MOW delivers around 11:20Am  Encouraged continued BP Checks  Discussed Cardiac Health  Discussed Urology Health    Case Manager To Do List for the Next Interaction:  Monthly follow up  F/U BP  F/U Cardio appointment 10/9   F/U Urology S/S    Plan to call patient in ~ one month to reassess and update plan of care.  Instructed patient to call with change in symptoms or as needed prior to next follow up.      Alden Benjamin, RN

## 2023-02-02 ENCOUNTER — Other Ambulatory Visit (HOSPITAL_COMMUNITY): Payer: Self-pay | Admitting: Student in an Organized Health Care Education/Training Program

## 2023-02-02 MED ORDER — TIOTROPIUM BROMIDE 18 MCG CAPSULE WITH INHALATION DEVICE
18.0000 ug | ORAL_CAPSULE | Freq: Every day | RESPIRATORY_TRACT | 5 refills | Status: DC
Start: 2023-02-02 — End: 2023-04-16

## 2023-02-02 NOTE — Progress Notes (Signed)
Patient requesting refills for Spiriva. Withholding inhaler therapy resulted in worsening dyspnea so he has resumed it.

## 2023-02-10 ENCOUNTER — Ambulatory Visit: Payer: Self-pay

## 2023-02-10 ENCOUNTER — Telehealth: Payer: Self-pay

## 2023-02-10 NOTE — Patient Outreach (Signed)
  Care Coordination   Initial Visit Note   02/10/2023 Name: Tanner [name] MRN: [id number] DOB: 1946/02/11  Tanner [name] is a 77 y.o. year old male who sees Myrlene Broker, MD for primary care. I spoke with  [name] by phone today.  What matters to the patients health and wellness today?  Mr. Belt reports recent right knee replacement which he states is healing well, but he states he is not walking like he should be-he reports he should be walking better. Reports has an appointment to start physical therapy with [facility] on 02/28/23. Also reports obtains medications through the [place], states he has requested a refill, but has not received it and only has one Eliquis tablet left. He is unclear what is causing the delay.  Goals Addressed             This Visit's Progress    Assistance with Health Management       Interventions Today    Flowsheet Row Most Recent Value  Chronic Disease   Chronic disease during today's visit Diabetes, Chronic Obstructive Pulmonary Disease (COPD)  General Interventions   General Interventions Discussed/Reviewed General Interventions Discussed, Doctor Visits  Doctor Visits Discussed/Reviewed Doctor Visits Discussed, PCP, Specialist  PCP/Specialist Visits Compliance with follow-up visit  [reviewed upcoming appointments]  Exercise Interventions   Exercise Discussed/Reviewed Exercise Discussed  [confirmed scheduled for [facility] PT for 02/28/23]  Education Interventions   Education Provided Provided Education  Provided Verbal Education On --  [advsied take medications as prescribed, attend provider visits as scheduled, attend therapy sessions and perform exercises as recommended. encouraged open communication with therapist re: patient's rehab goals.]  Nutrition Interventions   Nutrition Discussed/Reviewed Nutrition Discussed  [discussed montoring salt in diet]  Pharmacy Interventions   Pharmacy Dicussed/Reviewed Pharmacy Topics Discussed,  Referral to Pharmacist  [asisstance with obtaining Eliquis]  Safety Interventions   Safety Discussed/Reviewed Safety Discussed, Fall Risk  [fall prevention strategies discussed]            SDOH assessments and interventions completed:  Yes recently completed. No changes per patient.   Care Coordination Interventions:  Yes, provided   Follow up plan: Follow up call scheduled for 03/04/23    Encounter Outcome:  Pt. Visit Completed   Kathyrn Sheriff, RN, MSN, BSN, CCM Care Management Coordinator [phone]

## 2023-02-10 NOTE — Patient Outreach (Signed)
  Care Coordination   02/10/2023 Name: Tanner [name] MRN: [id number] DOB: 11/21/1945   Care Coordination Outreach Attempts:  An unsuccessful telephone outreach was attempted for a scheduled appointment today.  Follow Up Plan:  Additional outreach attempts will be made to offer the patient care coordination information and services.   Encounter Outcome:  No Answer   Care Coordination Interventions:  No, not indicated    Kathyrn Sheriff, RN, MSN, BSN, CCM Care Management Coordinator [phone]

## 2023-02-10 NOTE — Patient Instructions (Signed)
Visit Information  Thank you for taking time to visit with me today. Please don't hesitate to contact me if I can be of assistance to you.   Following are the goals we discussed today:   Goals Addressed             This Visit's Progress    Assistance with Health Management       Interventions Today    Flowsheet Row Most Recent Value  Chronic Disease   Chronic disease during today's visit Diabetes, Chronic Obstructive Pulmonary Disease (COPD)  General Interventions   General Interventions Discussed/Reviewed General Interventions Discussed, Doctor Visits  Doctor Visits Discussed/Reviewed Doctor Visits Discussed, PCP, Specialist  PCP/Specialist Visits Compliance with follow-up visit  [reviewed upcoming appointments]  Exercise Interventions   Exercise Discussed/Reviewed Exercise Discussed  [confirmed scheduled for [facility] PT for 02/28/23]  Education Interventions   Education Provided Provided Education  Provided Verbal Education On --  [advsied take medications as prescribed, attend provider visits as scheduled, attend therapy sessions and perform exercises as recommended. encouraged open communication with therapist re: patient's rehab goals.]  Nutrition Interventions   Nutrition Discussed/Reviewed Nutrition Discussed  [discussed montoring salt in diet]  Pharmacy Interventions   Pharmacy Dicussed/Reviewed Pharmacy Topics Discussed, Referral to Pharmacist  [asisstance with obtaining Eliquis]  Safety Interventions   Safety Discussed/Reviewed Safety Discussed, Fall Risk  [fall prevention strategies discussed]            Our next appointment is by telephone on 03/04/23 at 11:30 am  Please call the care guide team at [phone] if you need to cancel or reschedule your appointment.   If you are experiencing a Mental Health or Behavioral Health Crisis or need someone to talk to, please call the Suicide and Crisis Lifeline: 988 call the [place] National Suicide Prevention Lifeline:  260 470 8445 or TTY: 367 050 4511 TTY (918)689-7036) to talk to a trained counselor call 1-800-273-TALK (toll free, 24 hour hotline)  Kathyrn Sheriff, RN, MSN, BSN, CCM Care Management Coordinator [phone]

## 2023-02-12 ENCOUNTER — Other Ambulatory Visit (INDEPENDENT_AMBULATORY_CARE_PROVIDER_SITE_OTHER): Payer: Self-pay

## 2023-02-12 DIAGNOSIS — I10 Essential (primary) hypertension: Secondary | ICD-10-CM

## 2023-02-12 DIAGNOSIS — E782 Mixed hyperlipidemia: Secondary | ICD-10-CM

## 2023-02-12 DIAGNOSIS — Z7189 Other specified counseling: Secondary | ICD-10-CM

## 2023-02-12 NOTE — Progress Notes (Signed)
Dr. Rhoderick Moody, DO      This patient has met the requirements to bill for Chronic Care Management services this month. Please sign this encounter as you typically would any other encounter. There is NO SPECIAL ATTESTATION needed . COSIGN ONLY. Please let us know if you have any questions at all.    Chronic Care Management Time Documentation on 02/12/2023 11:12.   Time spent during current encounter is 1 minutes.   Cumulative time during current month's episode (month-to-date) is 35 minutes.          ICD-10-CM    1. Encounter for counseling for care management of patient with chronic conditions and complex health needs using nurse-based model  Z71.89       2. Essential hypertension  I10       3. Mixed hyperlipidemia  E78.2             acetaminophen (TYLENOL) 500 mg Oral Tablet, Take 1 Tablet (500 mg total) by mouth Every 4 hours as needed for Pain (pt takes tylenol pm)  alfuzosin (UROXATRAL) 10 mg Oral Tablet Sustained Release 24 hr, TAKE ONE TABLET BY MOUTH EVERY DAY  apixaban (ELIQUIS) 5 mg Oral Tablet, Take 1 Tablet (5 mg total) by mouth Twice daily SHIP TO PATIENT,   [id number]  STATE LICENSE- [id number]  NPI- [id number]  diltiazem HCl (TIAZAC) 240 mg Oral Capsule,Sustained Action 24 hr, TAKE ONE CAPSULE BY MOUTH DAILY  finasteride (PROSCAR) 5 mg Oral Tablet, Take 1 Tablet (5 mg total) by mouth Once a day  flecainide (TAMBOCOR) 100 mg Oral Tablet, TAKE ONE TABLET BY MOUTH TWICE DAILY  furosemide (LASIX) 40 mg Oral Tablet, TAKE ONE TABLET BY MOUTH EVERY DAY  losartan (COZAAR) 50 mg Oral Tablet, Take 1 Tablet (50 mg total) by mouth Once a day  nitroGLYCERIN (NITROSTAT) 0.4 mg Sublingual Tablet, Sublingual, Place 1 Tablet (0.4 mg total) under the tongue Every 5 minutes as needed for Chest pain  potassium chloride (KLOR-CON) 10 mEq Oral Tablet Sustained Release, TAKE ONE TABLET BY MOUTH EVERY DAY  rosuvastatin (CRESTOR) 10 mg Oral Tablet, Take 1 Tablet (10 mg total) by mouth Every evening  SPIRIVA WITH HANDIHALER  18 mcg Inhalation Capsule, w/Inhalation Device, inhale contents of ONE capsule EVERY DAY AS DIRECTED  tiotropium bromide (SPIRIVA HANDIHALER) 18 mcg Inhalation Capsule, w/Inhalation Device, Take 1 Capsule (18 mcg total) by inhalation Once a day    No facility-administered medications prior to visit.        Thanks,  Your Chronic Care Management Team

## 2023-02-17 ENCOUNTER — Telehealth: Payer: Self-pay

## 2023-02-17 NOTE — Patient Outreach (Signed)
  Care Coordination   Follow Up Visit Note   02/17/2023 Name: Tanner [name] MRN: [id number] DOB: 09/04/1945  Tanner [name] is a 77 y.o. year old male who sees Myrlene Broker, MD for primary care. I spoke with  Tanner [name] by phone today.  What matters to the patients health and wellness today?  RNCM called to follow up to see if patient received Eliquis refill. Tanner [name] states he received his refill the next day after speaking with RNCM. He denies any other concerns or questions at this time.  Goals Addressed             This Visit's Progress    Assistance with Health Management       Interventions Today    Flowsheet Row Most Recent Value  Chronic Disease   Chronic disease during today's visit Diabetes, Chronic Obstructive Pulmonary Disease (COPD), Atrial Fibrillation (AFib), Chronic Kidney Disease/End Stage Renal Disease (ESRD), Congestive Heart Failure (CHF), Hypertension (HTN)  Pharmacy Interventions   Pharmacy Dicussed/Reviewed Pharmacy Topics Reviewed  [[facility]            SDOH assessments and interventions completed:  No  Care Coordination Interventions:  Yes, provided   Follow up plan:  as previously scheduled    Encounter Outcome:  Patient Visit Completed   Kathyrn Sheriff, RN, MSN, BSN, CCM Care Management Coordinator [phone]

## 2023-03-03 ENCOUNTER — Ambulatory Visit: Payer: Medicare HMO | Admitting: Internal Medicine

## 2023-03-03 ENCOUNTER — Other Ambulatory Visit: Payer: Commercial Managed Care - PPO | Attending: Family Medicine

## 2023-03-03 ENCOUNTER — Other Ambulatory Visit: Payer: Self-pay

## 2023-03-04 ENCOUNTER — Telehealth (INDEPENDENT_AMBULATORY_CARE_PROVIDER_SITE_OTHER): Payer: Self-pay | Admitting: Family Medicine

## 2023-03-04 ENCOUNTER — Other Ambulatory Visit (INDEPENDENT_AMBULATORY_CARE_PROVIDER_SITE_OTHER): Payer: Self-pay

## 2023-03-04 DIAGNOSIS — Z7189 Other specified counseling: Secondary | ICD-10-CM

## 2023-03-04 NOTE — Nursing Note (Signed)
POPULATION HEALTH    DISEASE MANAGEMENT COORDINATOR    [facility] called patient for monthly follow up. Patient did not answer. [facility] left voicemail with call back information. [facility] will make another phone call within 48 business hours.     Alden Benjamin, RN, BSN  Disease Management Coordinator  [employer]  [phone]

## 2023-03-04 NOTE — Telephone Encounter (Signed)
Copied from CRM [id number]. Topic: Clinical - Call Back  >> Mar 04, 2023 11:00 AM Toni-Amy M wrote:  Jeanbaptiste, Thomas [name] is returning a call they received from the staff   Pt states he had a missed call and is returning it. Best call back is [phone].

## 2023-03-04 NOTE — Telephone Encounter (Signed)
I do not see where the clinic called the pt today.

## 2023-03-05 ENCOUNTER — Other Ambulatory Visit (INDEPENDENT_AMBULATORY_CARE_PROVIDER_SITE_OTHER): Payer: Self-pay

## 2023-03-05 DIAGNOSIS — Z7189 Other specified counseling: Secondary | ICD-10-CM

## 2023-03-05 NOTE — Nursing Note (Signed)
POPULATION HEALTH    COMPLEX CARE MANAGEMENT    Chronic Care Management - CCM  Status: Enrolled  Effective Dates: 12/02/2022 - present  Responsible Staff: Alden Benjamin, RN          Patient reports that he is sick . He started to not feel good on Tuesday , patient has nasal congestion and did have a low grade fever yesterday. Patient is drinking fluids vitamin c and has taken tylenol as needed for temp. He did sleep a lot and feels a little better today .[facility] did offer to call PCP if patient wanted. Patient did decide that he did not want to at the moment. [facility] will call patient on Monday to check in on patient . Patient is also aware of the nurse navigator 24 hour line. Patient did take tylenol once. Patient BP has been within normal range 136/74. Patient denies any CP or SOB. Patient denies any other urology symptoms. Patient does have Cardiology appointment 10/9 and Pulmonology 11/1. Patient did got to get blood work and they did not accept script they stated it was dated for after 9/18. Patient will get blood work in a couple weeks after he recovers from covid. No other complaints at this time. [facility] will continue to follow patient.     Care Management Tasks Completed:  Chart review completed.  Social determinants of health reviewed and updated as needed.  Care plan reviewed/discussed/updated.  Interventions ongoing and updated as needed.  Goals addressed and updated.  Interventions ongoing and updated as needed.  Education completed.  Self management plan reviewed and updated.  See patient care coordination note.  Barriers to health, care plan, and goals reviewed.  Interventions ongoing and updated as needed.  Health maintenance/care gaps reviewed and discussed with patient.  Health maintenance/care gaps updated as applicable.  Discussed upcoming appointment dates and times.  Reinforced importance of keeping all provider appointments.  Reinforced the importance of taking all medications as prescribed.  Medical  history, surgical history, hospitalizations, and medications reviewed and updated as applicable.    Ongoing and Updated Assessment Interventions:  Encouraged continued BP Checks  Discussed general Health - cold symptoms positive Covid test yesterday .   Discussed Blood Work patient will have to get blood work attempted to get it but date on script was not until after 9/18.   Discussed [facility]  Reviewed upcoming Cardiology appointment 10/9  Reviewed upcoming Pulmonology appointment 11/1      Case Manager To Do List for the Next Interaction:  Monthly follow up  F/U BP  F/U General Health  F/U Cardiac Appointment 10/9  Upcoming 11/9  F/U Urology Health    Plan to call patient in ~ one month to reassess and update plan of care.  Instructed patient to call with change in symptoms or as needed prior to next follow up.      Alden Benjamin, RN

## 2023-03-08 ENCOUNTER — Other Ambulatory Visit (INDEPENDENT_AMBULATORY_CARE_PROVIDER_SITE_OTHER): Payer: Self-pay | Admitting: Family Medicine

## 2023-03-08 ENCOUNTER — Other Ambulatory Visit (INDEPENDENT_AMBULATORY_CARE_PROVIDER_SITE_OTHER): Payer: Self-pay

## 2023-03-08 DIAGNOSIS — Z7189 Other specified counseling: Secondary | ICD-10-CM

## 2023-03-08 NOTE — Nursing Note (Signed)
Patient returned [facility] call and left voicemail that he is doing much better and that all symptoms have resolved.     Alden Benjamin, RN, BSN  Disease Management Coordinator  [employer]  [phone]

## 2023-03-08 NOTE — Telephone Encounter (Signed)
Last Visit:12/02/2022     Upcoming appointments: 06/07/2023           Shanda Howells, MA  03/08/2023, 15:08

## 2023-03-08 NOTE — Nursing Note (Signed)
POPULATION HEALTH    DISEASE MANAGEMENT COORDINATOR    [facility] called patient to check in on how he was feeling after testing positive for covid last week. Patient was improving on Friday. [facility] wanted to make sure that patient continued to improve. Patient did not answer. [facility] left voicemail with contact information, and informed patient to give [facility] a call if he wanted me to contact Dr. Gordan Payment for any needs.     Instructed patient to call with change in symptoms or as needed prior to next follow up.      Alden Benjamin, RN, BSN  Disease Management Coordinator  [employer]  [phone]

## 2023-03-15 ENCOUNTER — Other Ambulatory Visit (INDEPENDENT_AMBULATORY_CARE_PROVIDER_SITE_OTHER): Payer: Self-pay

## 2023-03-15 ENCOUNTER — Other Ambulatory Visit (INDEPENDENT_AMBULATORY_CARE_PROVIDER_SITE_OTHER): Payer: Self-pay | Admitting: Family Medicine

## 2023-03-15 ENCOUNTER — Other Ambulatory Visit (INDEPENDENT_AMBULATORY_CARE_PROVIDER_SITE_OTHER): Payer: Self-pay | Admitting: Physician Assistant

## 2023-03-15 DIAGNOSIS — E782 Mixed hyperlipidemia: Secondary | ICD-10-CM

## 2023-03-15 DIAGNOSIS — Z720 Tobacco use: Secondary | ICD-10-CM

## 2023-03-15 DIAGNOSIS — Z7902 Long term (current) use of antithrombotics/antiplatelets: Secondary | ICD-10-CM

## 2023-03-15 DIAGNOSIS — Z7189 Other specified counseling: Secondary | ICD-10-CM

## 2023-03-15 DIAGNOSIS — I10 Essential (primary) hypertension: Secondary | ICD-10-CM

## 2023-03-15 NOTE — Progress Notes (Cosign Needed Addendum)
Dr. Rhoderick Moody, DO      This patient has met the requirements to bill for COMPLEX Chronic Care Management this month.       Please add .[id number] to this note for attestation and billing...      Chronic Care Management Time Documentation on 03/15/2023 11:56.   Time spent during current encounter is 1 minutes.   Cumulative time during current month's episode (month-to-date) is 65 minutes.          ICD-10-CM    1. Encounter for counseling for care management of patient with chronic conditions and complex health needs using nurse-based model  Z71.89       2. Essential hypertension  I10       3. Mixed hyperlipidemia  E78.2             acetaminophen (TYLENOL) 500 mg Oral Tablet, Take 1 Tablet (500 mg total) by mouth Every 4 hours as needed for Pain (pt takes tylenol pm)  alfuzosin (UROXATRAL) 10 mg Oral Tablet Sustained Release 24 hr, TAKE ONE TABLET BY MOUTH EVERY DAY  apixaban (ELIQUIS) 5 mg Oral Tablet, Take 1 Tablet (5 mg total) by mouth Twice daily SHIP TO PATIENT,   [id number]  STATE LICENSE- [id number] L  NPI- [id number]  diltiazem HCl (TIAZAC) 240 mg Oral Capsule,Sustained Action 24 hr, TAKE ONE CAPSULE BY MOUTH DAILY  finasteride (PROSCAR) 5 mg Oral Tablet, TAKE 1 TABLET EVERY DAY  flecainide (TAMBOCOR) 100 mg Oral Tablet, TAKE ONE TABLET BY MOUTH TWICE DAILY  furosemide (LASIX) 40 mg Oral Tablet, TAKE ONE TABLET BY MOUTH EVERY DAY  losartan (COZAAR) 50 mg Oral Tablet, Take 1 Tablet (50 mg total) by mouth Once a day  nitroGLYCERIN (NITROSTAT) 0.4 mg Sublingual Tablet, Sublingual, Place 1 Tablet (0.4 mg total) under the tongue Every 5 minutes as needed for Chest pain  potassium chloride (KLOR-CON) 10 mEq Oral Tablet Sustained Release, TAKE ONE TABLET BY MOUTH EVERY DAY  rosuvastatin (CRESTOR) 10 mg Oral Tablet, Take 1 Tablet (10 mg total) by mouth Every evening  SPIRIVA WITH HANDIHALER 18 mcg Inhalation Capsule, w/Inhalation Device, inhale contents of ONE capsule EVERY DAY AS DIRECTED  tiotropium bromide  (SPIRIVA HANDIHALER) 18 mcg Inhalation Capsule, w/Inhalation Device, Take 1 Capsule (18 mcg total) by inhalation Once a day    No facility-administered medications prior to visit.      Thanks,  Your Chronic Care Management Team

## 2023-03-15 NOTE — Telephone Encounter (Signed)
Last Visit:12/02/2022     Upcoming appointments: 06/07/2023           Grover Canavan, MA  03/15/2023, 14:34

## 2023-03-17 ENCOUNTER — Ambulatory Visit (HOSPITAL_COMMUNITY): Payer: Medicare HMO | Attending: Cardiology

## 2023-03-17 ENCOUNTER — Encounter (HOSPITAL_COMMUNITY): Payer: Self-pay | Admitting: Cardiology

## 2023-03-17 DIAGNOSIS — R410 Disorientation, unspecified: Secondary | ICD-10-CM | POA: Diagnosis not present

## 2023-03-17 DIAGNOSIS — Z743 Need for continuous supervision: Secondary | ICD-10-CM | POA: Diagnosis not present

## 2023-03-17 DIAGNOSIS — R609 Edema, unspecified: Secondary | ICD-10-CM | POA: Diagnosis not present

## 2023-03-17 DIAGNOSIS — I10 Essential (primary) hypertension: Secondary | ICD-10-CM | POA: Diagnosis not present

## 2023-03-17 DIAGNOSIS — R531 Weakness: Secondary | ICD-10-CM | POA: Diagnosis not present

## 2023-03-18 ENCOUNTER — Other Ambulatory Visit: Payer: Self-pay

## 2023-03-18 ENCOUNTER — Inpatient Hospital Stay (HOSPITAL_COMMUNITY)
Admission: EM | Admit: 2023-03-18 | Discharge: 2023-03-26 | DRG: 871 | Disposition: A | Discharge status: Skilled Nursing Facility | Payer: No Typology Code available for payment source | Attending: Internal Medicine | Admitting: Internal Medicine

## 2023-03-18 ENCOUNTER — Encounter (HOSPITAL_COMMUNITY): Payer: Self-pay

## 2023-03-18 ENCOUNTER — Emergency Department (HOSPITAL_COMMUNITY): Payer: No Typology Code available for payment source

## 2023-03-18 ENCOUNTER — Inpatient Hospital Stay (HOSPITAL_COMMUNITY): Payer: No Typology Code available for payment source

## 2023-03-18 DIAGNOSIS — Z7984 Long term (current) use of oral hypoglycemic drugs: Secondary | ICD-10-CM

## 2023-03-18 DIAGNOSIS — Z83438 Family history of other disorder of lipoprotein metabolism and other lipidemia: Secondary | ICD-10-CM

## 2023-03-18 DIAGNOSIS — J690 Pneumonitis due to inhalation of food and vomit: Secondary | ICD-10-CM | POA: Diagnosis present

## 2023-03-18 DIAGNOSIS — N183 Chronic kidney disease, stage 3 unspecified: Secondary | ICD-10-CM | POA: Diagnosis present

## 2023-03-18 DIAGNOSIS — E1165 Type 2 diabetes mellitus with hyperglycemia: Secondary | ICD-10-CM | POA: Diagnosis present

## 2023-03-18 DIAGNOSIS — Z96651 Presence of right artificial knee joint: Secondary | ICD-10-CM | POA: Diagnosis present

## 2023-03-18 DIAGNOSIS — G4733 Obstructive sleep apnea (adult) (pediatric): Secondary | ICD-10-CM | POA: Diagnosis not present

## 2023-03-18 DIAGNOSIS — J9601 Acute respiratory failure with hypoxia: Secondary | ICD-10-CM | POA: Diagnosis present

## 2023-03-18 DIAGNOSIS — Z6838 Body mass index (BMI) 38.0-38.9, adult: Secondary | ICD-10-CM

## 2023-03-18 DIAGNOSIS — G9341 Metabolic encephalopathy: Secondary | ICD-10-CM | POA: Diagnosis present

## 2023-03-18 DIAGNOSIS — R4182 Altered mental status, unspecified: Secondary | ICD-10-CM | POA: Diagnosis present

## 2023-03-18 DIAGNOSIS — I10 Essential (primary) hypertension: Secondary | ICD-10-CM | POA: Diagnosis present

## 2023-03-18 DIAGNOSIS — L03115 Cellulitis of right lower limb: Secondary | ICD-10-CM | POA: Diagnosis present

## 2023-03-18 DIAGNOSIS — Z96653 Presence of artificial knee joint, bilateral: Secondary | ICD-10-CM | POA: Diagnosis present

## 2023-03-18 DIAGNOSIS — E876 Hypokalemia: Secondary | ICD-10-CM | POA: Diagnosis not present

## 2023-03-18 DIAGNOSIS — Z79899 Other long term (current) drug therapy: Secondary | ICD-10-CM

## 2023-03-18 DIAGNOSIS — J189 Pneumonia, unspecified organism: Principal | ICD-10-CM

## 2023-03-18 DIAGNOSIS — Z87891 Personal history of nicotine dependence: Secondary | ICD-10-CM | POA: Diagnosis not present

## 2023-03-18 DIAGNOSIS — Z86718 Personal history of other venous thrombosis and embolism: Secondary | ICD-10-CM | POA: Diagnosis not present

## 2023-03-18 DIAGNOSIS — Z86711 Personal history of pulmonary embolism: Secondary | ICD-10-CM | POA: Diagnosis not present

## 2023-03-18 DIAGNOSIS — W010XXA Fall on same level from slipping, tripping and stumbling without subsequent striking against object, initial encounter: Secondary | ICD-10-CM | POA: Diagnosis present

## 2023-03-18 DIAGNOSIS — I5042 Chronic combined systolic (congestive) and diastolic (congestive) heart failure: Secondary | ICD-10-CM | POA: Diagnosis present

## 2023-03-18 DIAGNOSIS — N1832 Chronic kidney disease, stage 3b: Secondary | ICD-10-CM | POA: Diagnosis not present

## 2023-03-18 DIAGNOSIS — Z885 Allergy status to narcotic agent status: Secondary | ICD-10-CM

## 2023-03-18 DIAGNOSIS — Z1152 Encounter for screening for COVID-19: Secondary | ICD-10-CM | POA: Diagnosis not present

## 2023-03-18 DIAGNOSIS — E7800 Pure hypercholesterolemia, unspecified: Secondary | ICD-10-CM | POA: Diagnosis present

## 2023-03-18 DIAGNOSIS — R0902 Hypoxemia: Secondary | ICD-10-CM

## 2023-03-18 DIAGNOSIS — Z833 Family history of diabetes mellitus: Secondary | ICD-10-CM

## 2023-03-18 DIAGNOSIS — I480 Paroxysmal atrial fibrillation: Secondary | ICD-10-CM | POA: Diagnosis present

## 2023-03-18 DIAGNOSIS — Z820 Family history of epilepsy and other diseases of the nervous system: Secondary | ICD-10-CM

## 2023-03-18 DIAGNOSIS — I5022 Chronic systolic (congestive) heart failure: Secondary | ICD-10-CM | POA: Diagnosis present

## 2023-03-18 DIAGNOSIS — E11649 Type 2 diabetes mellitus with hypoglycemia without coma: Secondary | ICD-10-CM | POA: Diagnosis not present

## 2023-03-18 DIAGNOSIS — Z8349 Family history of other endocrine, nutritional and metabolic diseases: Secondary | ICD-10-CM

## 2023-03-18 DIAGNOSIS — N400 Enlarged prostate without lower urinary tract symptoms: Secondary | ICD-10-CM | POA: Diagnosis present

## 2023-03-18 DIAGNOSIS — R6520 Severe sepsis without septic shock: Secondary | ICD-10-CM | POA: Diagnosis present

## 2023-03-18 DIAGNOSIS — E1122 Type 2 diabetes mellitus with diabetic chronic kidney disease: Secondary | ICD-10-CM | POA: Diagnosis present

## 2023-03-18 DIAGNOSIS — A419 Sepsis, unspecified organism: Secondary | ICD-10-CM | POA: Diagnosis not present

## 2023-03-18 DIAGNOSIS — N1831 Chronic kidney disease, stage 3a: Secondary | ICD-10-CM | POA: Diagnosis present

## 2023-03-18 DIAGNOSIS — L89892 Pressure ulcer of other site, stage 2: Secondary | ICD-10-CM | POA: Diagnosis present

## 2023-03-18 DIAGNOSIS — L8990 Pressure ulcer of unspecified site, unspecified stage: Secondary | ICD-10-CM | POA: Diagnosis not present

## 2023-03-18 DIAGNOSIS — Z818 Family history of other mental and behavioral disorders: Secondary | ICD-10-CM

## 2023-03-18 DIAGNOSIS — I130 Hypertensive heart and chronic kidney disease with heart failure and stage 1 through stage 4 chronic kidney disease, or unspecified chronic kidney disease: Secondary | ICD-10-CM | POA: Diagnosis present

## 2023-03-18 DIAGNOSIS — M25461 Effusion, right knee: Secondary | ICD-10-CM | POA: Diagnosis present

## 2023-03-18 DIAGNOSIS — Z888 Allergy status to other drugs, medicaments and biological substances status: Secondary | ICD-10-CM

## 2023-03-18 DIAGNOSIS — L0390 Cellulitis, unspecified: Secondary | ICD-10-CM | POA: Diagnosis not present

## 2023-03-18 DIAGNOSIS — Z7901 Long term (current) use of anticoagulants: Secondary | ICD-10-CM

## 2023-03-18 DIAGNOSIS — L89322 Pressure ulcer of left buttock, stage 2: Secondary | ICD-10-CM | POA: Diagnosis not present

## 2023-03-18 LAB — BLOOD GAS, VENOUS
Acid-Base Excess: 2.4 mmol/L — ABNORMAL HIGH (ref 0.0–2.0)
Bicarbonate: 27.2 mmol/L (ref 20.0–28.0)
Drawn by: 1470
O2 Saturation: 57.7 %
Patient temperature: 36.9
pCO2, Ven: 42 mm[Hg] — ABNORMAL LOW (ref 44–60)
pH, Ven: 7.42 (ref 7.25–7.43)
pO2, Ven: 34 mm[Hg] (ref 32–45)

## 2023-03-18 LAB — I-STAT CG4 LACTIC ACID, ED: Lactic Acid, Venous: 1.6 mmol/L (ref 0.5–1.9)

## 2023-03-18 LAB — GLUCOSE, CAPILLARY
Glucose-Capillary: 69 mg/dL — ABNORMAL LOW (ref 70–99)
Glucose-Capillary: 101 mg/dL — ABNORMAL HIGH (ref 70–99)
Glucose-Capillary: 68 mg/dL — ABNORMAL LOW (ref 70–99)
Glucose-Capillary: 92 mg/dL (ref 70–99)
Glucose-Capillary: 79 mg/dL (ref 70–99)
Glucose-Capillary: 97 mg/dL (ref 70–99)

## 2023-03-18 LAB — CBC WITH DIFFERENTIAL/PLATELET
Abs Immature Granulocytes: 0.08 10*3/uL — ABNORMAL HIGH (ref 0.00–0.07)
Basophils Absolute: 0.1 10*3/uL (ref 0.0–0.1)
Basophils Relative: 0 %
Eosinophils Absolute: 0 10*3/uL (ref 0.0–0.5)
Eosinophils Relative: 0 %
HCT: 42.8 % (ref 39.0–52.0)
Hemoglobin: 13 g/dL (ref 13.0–17.0)
Immature Granulocytes: 1 %
Lymphocytes Relative: 8 %
Lymphs Abs: 1.1 10*3/uL (ref 0.7–4.0)
MCH: 24.4 pg — ABNORMAL LOW (ref 26.0–34.0)
MCHC: 30.4 g/dL (ref 30.0–36.0)
MCV: 80.3 fL (ref 80.0–100.0)
Monocytes Absolute: 1.5 10*3/uL — ABNORMAL HIGH (ref 0.1–1.0)
Monocytes Relative: 11 %
Neutro Abs: 10.8 10*3/uL — ABNORMAL HIGH (ref 1.7–7.7)
Neutrophils Relative %: 80 %
Platelets: 197 10*3/uL (ref 150–400)
RBC: 5.33 MIL/uL (ref 4.22–5.81)
RDW: 17.4 % — ABNORMAL HIGH (ref 11.5–15.5)
WBC: 13.6 10*3/uL — ABNORMAL HIGH (ref 4.0–10.5)
nRBC: 0 % (ref 0.0–0.2)

## 2023-03-18 LAB — VITAMIN B12: Vitamin B-12: 362 pg/mL (ref 180–914)

## 2023-03-18 LAB — I-STAT VENOUS BLOOD GAS, ED
Acid-Base Excess: 0 mmol/L (ref 0.0–2.0)
Bicarbonate: 26 mmol/L (ref 20.0–28.0)
Calcium, Ion: 1.11 mmol/L — ABNORMAL LOW (ref 1.15–1.40)
HCT: 45 % (ref 39.0–52.0)
Hemoglobin: 15.3 g/dL (ref 13.0–17.0)
O2 Saturation: 33 %
Potassium: 4.1 mmol/L (ref 3.5–5.1)
Sodium: 139 mmol/L (ref 135–145)
TCO2: 27 mmol/L (ref 22–32)
pCO2, Ven: 44.6 mm[Hg] (ref 44–60)
pH, Ven: 7.374 (ref 7.25–7.43)
pO2, Ven: 21 mm[Hg] — CL (ref 32–45)

## 2023-03-18 LAB — TROPONIN I (HIGH SENSITIVITY)
Troponin I (High Sensitivity): 14 ng/L (ref <18)
Troponin I (High Sensitivity): 17 ng/L (ref <18)

## 2023-03-18 LAB — URINALYSIS, ROUTINE W REFLEX MICROSCOPIC
Bilirubin Urine: NEGATIVE
Glucose, UA: >=500 mg/dL — AB
Ketones, ur: NEGATIVE mg/dL
Nitrite: NEGATIVE
Protein, ur: 100 mg/dL — AB
Specific Gravity, Urine: 1.017 (ref 1.005–1.030)
pH: 5 (ref 5.0–8.0)

## 2023-03-18 LAB — COMPREHENSIVE METABOLIC PANEL
ALT: 16 U/L (ref 0–44)
AST: 15 U/L (ref 15–41)
Albumin: 3.4 g/dL — ABNORMAL LOW (ref 3.5–5.0)
Alkaline Phosphatase: 85 U/L (ref 38–126)
Anion gap: 13 (ref 5–15)
BUN: 18 mg/dL (ref 8–23)
CO2: 23 mmol/L (ref 22–32)
Calcium: 9.1 mg/dL (ref 8.9–10.3)
Chloride: 101 mmol/L (ref 98–111)
Creatinine, Ser: 1.62 mg/dL — ABNORMAL HIGH (ref 0.61–1.24)
GFR, Estimated: 43 mL/min — ABNORMAL LOW (ref >60)
Glucose, Bld: 80 mg/dL (ref 70–99)
Potassium: 4.1 mmol/L (ref 3.5–5.1)
Sodium: 137 mmol/L (ref 135–145)
Total Bilirubin: 1.2 mg/dL (ref 0.3–1.2)
Total Protein: 6.8 g/dL (ref 6.5–8.1)

## 2023-03-18 LAB — RENAL FUNCTION PANEL
Albumin: 2.6 g/dL — ABNORMAL LOW (ref 3.5–5.0)
Anion gap: 14 (ref 5–15)
BUN: 18 mg/dL (ref 8–23)
CO2: 21 mmol/L — ABNORMAL LOW (ref 22–32)
Calcium: 8.5 mg/dL — ABNORMAL LOW (ref 8.9–10.3)
Chloride: 101 mmol/L (ref 98–111)
Creatinine, Ser: 1.61 mg/dL — ABNORMAL HIGH (ref 0.61–1.24)
GFR, Estimated: 44 mL/min — ABNORMAL LOW (ref >60)
Glucose, Bld: 124 mg/dL — ABNORMAL HIGH (ref 70–99)
Phosphorus: 3 mg/dL (ref 2.5–4.6)
Potassium: 4.1 mmol/L (ref 3.5–5.1)
Sodium: 136 mmol/L (ref 135–145)

## 2023-03-18 LAB — RAPID URINE DRUG SCREEN, HOSP PERFORMED
Amphetamines: NOT DETECTED
Barbiturates: NOT DETECTED
Benzodiazepines: NOT DETECTED
Cocaine: NOT DETECTED
Opiates: NOT DETECTED
Tetrahydrocannabinol: NOT DETECTED

## 2023-03-18 LAB — CBC
HCT: 37.1 % — ABNORMAL LOW (ref 39.0–52.0)
Hemoglobin: 11.1 g/dL — ABNORMAL LOW (ref 13.0–17.0)
MCH: 23.9 pg — ABNORMAL LOW (ref 26.0–34.0)
MCHC: 29.9 g/dL — ABNORMAL LOW (ref 30.0–36.0)
MCV: 80 fL (ref 80.0–100.0)
Platelets: 161 10*3/uL (ref 150–400)
RBC: 4.64 MIL/uL (ref 4.22–5.81)
RDW: 17.2 % — ABNORMAL HIGH (ref 11.5–15.5)
WBC: 10.7 10*3/uL — ABNORMAL HIGH (ref 4.0–10.5)
nRBC: 0 % (ref 0.0–0.2)

## 2023-03-18 LAB — RESP PANEL BY RT-PCR (RSV, FLU A&B, COVID)  RVPGX2
Influenza A by PCR: NEGATIVE
Influenza B by PCR: NEGATIVE
Resp Syncytial Virus by PCR: NEGATIVE
SARS Coronavirus 2 by RT PCR: NEGATIVE

## 2023-03-18 LAB — TSH: TSH: 8.665 u[IU]/mL — ABNORMAL HIGH (ref 0.350–4.500)

## 2023-03-18 LAB — PROTIME-INR
INR: 1.5 — ABNORMAL HIGH (ref 0.8–1.2)
Prothrombin Time: 18.4 s — ABNORMAL HIGH (ref 11.4–15.2)

## 2023-03-18 LAB — MAGNESIUM: Magnesium: 1.9 mg/dL (ref 1.7–2.4)

## 2023-03-18 LAB — BRAIN NATRIURETIC PEPTIDE: B Natriuretic Peptide: 157.3 pg/mL — ABNORMAL HIGH (ref 0.0–100.0)

## 2023-03-18 LAB — PROCALCITONIN: Procalcitonin: 0.43 ng/mL

## 2023-03-18 LAB — T4, FREE: Free T4: 0.65 ng/dL (ref 0.61–1.12)

## 2023-03-18 LAB — AMMONIA: Ammonia: 19 umol/L (ref 9–35)

## 2023-03-18 MED ORDER — NORTRIPTYLINE HCL 10 MG PO CAPS
10.0000 mg | ORAL_CAPSULE | Freq: Every day | ORAL | Status: DC
  Filled 2023-03-18: qty 1

## 2023-03-18 MED ORDER — LACTATED RINGERS IV SOLN: INTRAVENOUS | Status: DC

## 2023-03-18 MED ORDER — SODIUM CHLORIDE 0.9 % IV SOLN
500.0000 mg | INTRAVENOUS | Status: DC
  Administered 2023-03-18: 500 mg via INTRAVENOUS
  Filled 2023-03-18: qty 5

## 2023-03-18 MED ORDER — LACTATED RINGERS IV BOLUS (SEPSIS)
1000.0000 mL | Freq: Once | INTRAVENOUS | Status: AC
  Administered 2023-03-18: 1000 mL via INTRAVENOUS

## 2023-03-18 MED ORDER — SODIUM CHLORIDE 0.9 % IV SOLN
1.0000 g | Freq: Once | INTRAVENOUS | Status: AC
  Administered 2023-03-18: 1 g via INTRAVENOUS
  Filled 2023-03-18: qty 10

## 2023-03-18 MED ORDER — INSULIN ASPART 100 UNIT/ML IJ SOLN: 0.0000 [IU] | Freq: Every day | INTRAMUSCULAR | Status: DC

## 2023-03-18 MED ORDER — ATORVASTATIN CALCIUM 80 MG PO TABS
80.0000 mg | ORAL_TABLET | Freq: Every day | ORAL | Status: DC
  Administered 2023-03-18 – 2023-03-26 (×9): 80 mg via ORAL
  Filled 2023-03-18 (×9): qty 1

## 2023-03-18 MED ORDER — INSULIN ASPART 100 UNIT/ML IJ SOLN: 0.0000 [IU] | Freq: Three times a day (TID) | INTRAMUSCULAR | Status: DC

## 2023-03-18 MED ORDER — AMIODARONE HCL 200 MG PO TABS
200.0000 mg | ORAL_TABLET | Freq: Every day | ORAL | Status: DC
  Administered 2023-03-18 – 2023-03-26 (×9): 200 mg via ORAL
  Filled 2023-03-18 (×9): qty 1

## 2023-03-18 MED ORDER — GUAIFENESIN 100 MG/5ML PO LIQD
5.0000 mL | ORAL | Status: DC | PRN
  Administered 2023-03-18: 5 mL via ORAL
  Filled 2023-03-18: qty 15

## 2023-03-18 MED ORDER — SODIUM CHLORIDE 0.9 % IV SOLN
500.0000 mg | Freq: Once | INTRAVENOUS | Status: AC
  Administered 2023-03-18: 500 mg via INTRAVENOUS
  Filled 2023-03-18: qty 5

## 2023-03-18 MED ORDER — MELATONIN 5 MG PO TABS: 5.0000 mg | ORAL_TABLET | Freq: Every evening | ORAL | Status: DC | PRN

## 2023-03-18 MED ORDER — SODIUM CHLORIDE 0.9 % IV SOLN
2.0000 g | INTRAVENOUS | Status: DC
  Administered 2023-03-18 – 2023-03-19 (×2): 2 g via INTRAVENOUS
  Filled 2023-03-18 (×2): qty 20

## 2023-03-18 MED ORDER — CARVEDILOL 3.125 MG PO TABS
3.1250 mg | ORAL_TABLET | Freq: Two times a day (BID) | ORAL | Status: DC
  Administered 2023-03-18 – 2023-03-20 (×5): 3.125 mg via ORAL
  Filled 2023-03-18 (×5): qty 1

## 2023-03-18 MED ORDER — FINASTERIDE 5 MG PO TABS
5.0000 mg | ORAL_TABLET | Freq: Every day | ORAL | Status: DC
  Administered 2023-03-18 – 2023-03-25 (×8): 5 mg via ORAL
  Filled 2023-03-18 (×8): qty 1

## 2023-03-18 MED ORDER — APIXABAN 5 MG PO TABS
5.0000 mg | ORAL_TABLET | Freq: Two times a day (BID) | ORAL | Status: DC
  Administered 2023-03-18 – 2023-03-26 (×17): 5 mg via ORAL
  Filled 2023-03-18 (×17): qty 1

## 2023-03-18 MED ORDER — BUPROPION HCL ER (SR) 150 MG PO TB12
150.0000 mg | ORAL_TABLET | Freq: Two times a day (BID) | ORAL | Status: DC
  Administered 2023-03-18 – 2023-03-26 (×17): 150 mg via ORAL
  Filled 2023-03-18 (×18): qty 1

## 2023-03-18 MED ORDER — PROCHLORPERAZINE EDISYLATE 10 MG/2ML IJ SOLN: 5.0000 mg | Freq: Four times a day (QID) | INTRAMUSCULAR | Status: DC | PRN

## 2023-03-18 MED ORDER — ENOXAPARIN SODIUM 40 MG/0.4ML IJ SOSY: 40.0000 mg | PREFILLED_SYRINGE | Freq: Every day | INTRAMUSCULAR | Status: DC

## 2023-03-18 MED ORDER — GABAPENTIN 100 MG PO CAPS
200.0000 mg | ORAL_CAPSULE | Freq: Three times a day (TID) | ORAL | Status: DC
  Administered 2023-03-18: 200 mg via ORAL
  Filled 2023-03-18: qty 2

## 2023-03-18 MED ORDER — ACETAMINOPHEN 325 MG PO TABS
650.0000 mg | ORAL_TABLET | Freq: Four times a day (QID) | ORAL | Status: DC | PRN
  Administered 2023-03-18 – 2023-03-25 (×4): 650 mg via ORAL
  Filled 2023-03-18 (×4): qty 2

## 2023-03-18 MED ORDER — POLYETHYLENE GLYCOL 3350 17 G PO PACK: 17.0000 g | PACK | Freq: Every day | ORAL | Status: DC | PRN

## 2023-03-18 NOTE — Progress Notes (Signed)
New Admission Note:   Arrival Method: Arrived from [facility] ED via stretcher Mental Orientation: Alert and oriented to person and place Telemetry: Box #21 Assessment: Completed Skin: Pls see docflowsheet IV: Rt FA, Lt Hand Pain: 0/10 Tubes: N/A Safety Measures: Safety Fall Prevention Plan has been discussed.  Admission: Completed Orientation: Patient has been oriented to the room, unit and staff.  Family: None at bedside  Orders have been reviewed and implemented. Will continue to monitor the patient. Call light has been placed within reach and bed alarm has been activated.   Joene Gelder [employer], RN-BC Phone number: [phone]

## 2023-03-18 NOTE — ED Notes (Signed)
Had to cut PT's armband off (left wrist) because it was too tight around his wrist causing indentation

## 2023-03-18 NOTE — ED Provider Notes (Signed)
[place] EMERGENCY DEPARTMENT AT [facility] Provider Note  CSN: [id number] Arrival date & time: 03/18/23 0008  Chief Complaint(s) Altered Mental Status  HPI Tanner [name] is a 77 y.o. male with past medical history as below, significant for CHF, DM2, OSA, PE, A-fib CKD stage III, anticoagulated who presents to the ED with complaint of AMS  Patient here with family, they went to go check on him this evening because he was not answering his phone.  When he arrived at his residence he was slouched over in his recliner, halfway falling out.  Unable to get up out of the recliner.  Felt he was confused not acting himself, he checked his pulse ox with a hand-held pulse oximeter and his pulse ox was mid 80s.  No home oxygen use, diagnosis of OSA on record but patient denies this.  Last known normal was Sunday.  Patient reports that he had a fall at [employer], slipped on a wet spot on the ground and hurt his right knee.  He is postop around 2 months from total knee right.  He has some redness to his left leg and swelling around the knee that is mildly worsened from prior.  Family reports he is more alert at this time that he was on their arrival to his house.  Patient reports he feels tired, coughing, fatigue but otherwise has no other complaints.   Past Medical History Past Medical History:  Diagnosis Date   Acute kidney failure (HCC)    Anxiety    Arthritis    Back pain    BPH (benign prostatic hypertrophy)    CHF (congestive heart failure) (HCC)    Clostridium difficile infection    Clotting disorder (HCC)    Depression    Diabetes (HCC) 12/11/2016   type 2    DVT (deep venous thrombosis) (HCC)    DVT of deep femoral vein, right (HCC) 06/29/2018   Edema, lower extremity    High cholesterol    Hypertension    Joint pain    Low back pain potentially associated with spinal stenosis    Neuromuscular disorder (HCC)    Neuropathy of lower extremity    bilateral   OSA  (obstructive sleep apnea)    pt denies    Osteoarthritis    PE (pulmonary embolism)    Pneumonia    hx of x 2    Ventral hernia    Patient Active Problem List   Diagnosis Date Noted   S/P total knee arthroplasty, right 12/15/2022   Acute respiratory failure with hypoxemia (HCC) 10/22/2022   Class 2 obesity 08/19/2022   Paroxysmal atrial fibrillation (HCC) 08/19/2022   Jaw pain 06/24/2022   Left hand pain 05/29/2022   Chronic systolic heart failure (HCC) 02/23/2022   Moderate mitral regurgitation 02/23/2022   Leg swelling 01/29/2022   ED (erectile dysfunction) 01/13/2021   Cyst, dermoid, scalp and neck 01/13/2021   Chronic kidney disease (CKD), stage III (moderate) (HCC) 12/13/2019   Carpal tunnel syndrome 12/05/2019   Elevated creatine kinase 11/15/2019   Aortic atherosclerosis (HCC) 11/15/2019   Recurrent Clostridium difficile diarrhea 05/28/2017   Peripheral neuropathy 03/17/2017   Diabetes (HCC) 12/11/2016   Allergic rhinitis 12/11/2016   Degenerative arthritis of left knee 01/30/2016   Morbid obesity (HCC) 01/30/2016   Routine general medical examination at a health care facility 04/20/2014   History of pulmonary embolus (PE) 12/19/2013   Anxiety state 03/29/2007   Depression 03/29/2007   OSA (obstructive sleep  apnea) 03/29/2007   Unspecified glaucoma 03/29/2007   BPH (benign prostatic hyperplasia) 03/29/2007   OA (osteoarthritis) of knee 03/29/2007   Essential hypertension 03/29/2007   History of Bell's palsy 03/29/2007   Home Medication(s) Prior to Admission medications   Medication Sig Start Date End Date Taking? Authorizing Provider  acetaminophen (TYLENOL) 500 MG tablet Take 1,000 mg by mouth every 6 (six) hours as needed for moderate pain. 02/21/13   Norins, Rosalyn Gess, MD  acidophilus (RISAQUAD) CAPS capsule Take 1 capsule by mouth daily.    [provider]  amiodarone (PACERONE) 200 MG tablet Take 1 tablet (200 mg total) by mouth daily. 10/21/22    Maisie Fus, MD  apixaban (ELIQUIS) 5 MG TABS tablet Take 5 mg by mouth 2 (two) times daily.    [provider]  atorvastatin (LIPITOR) 80 MG tablet Take 1 tablet (80 mg total) by mouth daily. 09/17/22 12/15/22  Myrlene Broker, MD  buPROPion (WELLBUTRIN SR) 150 MG 12 hr tablet Take 150 mg by mouth 2 (two) times daily.    [provider]  carvedilol (COREG) 6.25 MG tablet Take 1 tablet (6.25 mg total) by mouth 2 (two) times daily with a meal. 09/17/22 03/16/23  Myrlene Broker, MD  clobetasol ointment (TEMOVATE) 0.05 % Apply 1 Application topically daily as needed (irritation). 09/15/21   [provider]  empagliflozin (JARDIANCE) 10 MG TABS tablet Take 1 tablet (10 mg total) by mouth daily. 10/01/22   Corwin Levins, MD  finasteride (PROSCAR) 5 MG tablet Take 5 mg by mouth at bedtime.    [provider]  gabapentin (NEURONTIN) 300 MG capsule Take 300-600 mg by mouth See admin instructions. Take 600 mg by mouth in the morning, afternoon and evening and take 300 mg at night    [provider]  lidocaine (XYLOCAINE) 2 % solution Use as directed 15 mLs in the mouth or throat as needed for mouth pain. 07/17/22   Myrlene Broker, MD  Menthol-Methyl Salicylate (THERA-GESIC PLUS) CREA Apply 1 application topically at bedtime. Apply to lower back    [provider]  methocarbamol (ROBAXIN) 500 MG tablet Take 1 tablet (500 mg total) by mouth every 6 (six) hours as needed for muscle spasms. 12/16/22   Cassandria Anger, PA-C  Multiple Vitamins-Minerals (MULTIVITAMINS THER. W/MINERALS) TABS Take 1 tablet by mouth daily.    [provider]  nortriptyline (PAMELOR) 10 MG capsule 2 CAPSULES EVERY NIGHT AT BEDTIME 12/20/17   Patel, Roxana Hires K, DO  oxyCODONE (OXY IR/ROXICODONE) 5 MG immediate release tablet Take 1 tablet (5 mg total) by mouth every 4 (four) hours as needed for severe pain. 12/16/22   Cassandria Anger, PA-C  polyethylene glycol (MIRALAX /  GLYCOLAX) 17 g packet Take 17 g by mouth 2 (two) times daily. 12/16/22   Cassandria Anger, PA-C  saccharomyces boulardii (FLORASTOR) 250 MG capsule Take 1 capsule (250 mg total) by mouth 2 (two) times daily. 05/31/17   Rolly Salter, MD  Semaglutide, 1 MG/DOSE, 4 MG/3ML SOPN Inject 1 mg as directed once a week. Patient not taking: Reported on 02/10/2023 05/26/22   Myrlene Broker, MD  Semaglutide, 2 MG/DOSE, 8 MG/3ML SOPN Inject 2 mg as directed once a week. Patient not taking: Reported on 02/10/2023 05/26/22   Myrlene Broker, MD  sildenafil (VIAGRA) 100 MG tablet Take 100 mg by mouth as needed for erectile dysfunction. 02/27/20   [provider]  torsemide (DEMADEX) 20 MG  tablet TAKE 1 TABLET BY MOUTH EVERY DAY 01/18/23   Myrlene Broker, MD                                                                                                                                    Past Surgical History Past Surgical History:  Procedure Laterality Date   CARDIOVERSION N/A 08/19/2022   Procedure: CARDIOVERSION;  Surgeon: Meriam Sprague, MD;  Location: [facility] ENDOSCOPY;  Service: Cardiovascular;  Laterality: N/A;   EYE SURGERY     HERNIA REPAIR  1999   TOTAL KNEE ARTHROPLASTY Left 08/26/2020   Procedure: LEFT TOTAL KNEE ARTHROPLASTY;  Surgeon: Gean Birchwood, MD;  Location: [facility] ORS;  Service: Orthopedics;  Laterality: Left;   TOTAL KNEE ARTHROPLASTY Right 12/15/2022   Procedure: TOTAL KNEE ARTHROPLASTY;  Surgeon: Durene Romans, MD;  Location: [facility] ORS;  Service: Orthopedics;  Laterality: Right;   Family History Family History  Problem Relation Age of Onset   Diabetes Mother    Pneumonia Mother    Alzheimer's disease Mother    Hyperlipidemia Mother    Thyroid disease Mother    Depression Mother    Other Father 79       Drowned on boating accident   Colon cancer Neg Hx    Colon polyps Neg Hx     Social History Social History   Tobacco Use   Smoking status: Former    Current  packs/day: 0.00    Average packs/day: 2.0 packs/day for 33.0 years (66.1 ttl pk-yrs)    Types: Cigarettes    Start date: 08/30/1968    Quit date: 07/02/2001    Years since quitting: 21.7   Smokeless tobacco: Never   Tobacco comments:    quit 12 years ago  Vaping Use   Vaping status: Never Used  Substance Use Topics   Alcohol use: Yes    Comment: seldom    Drug use: No   Allergies Lisinopril, Amlodipine, and Codeine sulfate  Review of Systems Review of Systems  Constitutional:  Positive for fatigue. Negative for chills.  Respiratory:  Negative for chest tightness and shortness of breath.   Cardiovascular:  Negative for chest pain and palpitations.  Gastrointestinal:  Negative for abdominal pain, diarrhea, nausea and vomiting.  Genitourinary:  Negative for dysuria.  Musculoskeletal:  Positive for arthralgias.  Neurological:  Negative for tremors, numbness and headaches.  All other systems reviewed and are negative.   Physical Exam Vital Signs  I have reviewed the triage vital signs BP (!) 154/111   Pulse 78   Temp 98.3 F (36.8 C) (Oral)   Resp (!) 24   Ht 5' 5.5" (1.664 m)   Wt 105.2 kg   SpO2 98%   BMI 38.01 kg/m  Physical Exam Vitals and nursing note reviewed.  Constitutional:      General: He is not in acute distress.    Appearance: He is well-developed.  HENT:  Head: Normocephalic and atraumatic.     Right Ear: External ear normal.     Left Ear: External ear normal.     Mouth/Throat:     Mouth: Mucous membranes are moist.  Eyes:     General: No scleral icterus. Cardiovascular:     Rate and Rhythm: Normal rate and regular rhythm.     Pulses: Normal pulses.     Heart sounds: Normal heart sounds.  Pulmonary:     Effort: Pulmonary effort is normal. No respiratory distress.     Breath sounds: Normal breath sounds.  Abdominal:     General: Abdomen is flat.     Palpations: Abdomen is soft.     Tenderness: There is no abdominal tenderness.   Musculoskeletal:     Cervical back: No rigidity.     Right lower leg: No edema.     Left lower leg: No edema.       Legs:     Comments: Swelling to right knee  Pedal edema noted b/l  Erythema to anterior tibia region, similar to prior per pt/family  Skin:    General: Skin is warm and dry.     Capillary Refill: Capillary refill takes less than 2 seconds.  Neurological:     Mental Status: He is alert and oriented to person, place, and time.     GCS: GCS eye subscore is 4. GCS verbal subscore is 5. GCS motor subscore is 6.     Cranial Nerves: Cranial nerves 2-12 are intact. No dysarthria.     Sensory: Sensation is intact.     Motor: Motor function is intact. No tremor.     Coordination: Coordination is intact. Finger-Nose-Finger Test normal.     Comments: Right-sided facial droop unchanged from prior given history of Bell's palsy  Gait testing deferred secondary to patient safety.   Psychiatric:        Mood and Affect: Mood normal.        Behavior: Behavior normal.     ED Results and Treatments Labs (all labs ordered are listed, but only abnormal results are displayed) Labs Reviewed  CBC WITH DIFFERENTIAL/PLATELET - Abnormal; Notable for the following components:      Result Value   WBC 13.6 (*)    MCH 24.4 (*)    RDW 17.4 (*)    Neutro Abs 10.8 (*)    Monocytes Absolute 1.5 (*)    Abs Immature Granulocytes 0.08 (*)    All other components within normal limits  COMPREHENSIVE METABOLIC PANEL - Abnormal; Notable for the following components:   Creatinine, Ser 1.62 (*)    Albumin 3.4 (*)    GFR, Estimated 43 (*)    All other components within normal limits  BRAIN NATRIURETIC PEPTIDE - Abnormal; Notable for the following components:   B Natriuretic Peptide 157.3 (*)    All other components within normal limits  TSH - Abnormal; Notable for the following components:   TSH 8.665 (*)    All other components within normal limits  I-STAT VENOUS BLOOD GAS, ED - Abnormal;  Notable for the following components:   pO2, Ven 21 (*)    Calcium, Ion 1.11 (*)    All other components within normal limits  RESP PANEL BY RT-PCR (RSV, FLU A&B, COVID)  RVPGX2  CULTURE, BLOOD (ROUTINE X 2)  CULTURE, BLOOD (ROUTINE X 2)  T4, FREE  URINALYSIS, ROUTINE W REFLEX MICROSCOPIC  PROTIME-INR  I-STAT CG4 LACTIC ACID, ED  TROPONIN I (HIGH SENSITIVITY)  TROPONIN I (HIGH SENSITIVITY)                                                                                                                          Radiology CT Head Wo Contrast  Result Date: 03/18/2023 CLINICAL DATA:  Altered mental status EXAM: CT HEAD WITHOUT CONTRAST TECHNIQUE: Contiguous axial images were obtained from the base of the skull through the vertex without intravenous contrast. RADIATION DOSE REDUCTION: This exam was performed according to the departmental dose-optimization program which includes automated exposure control, adjustment of the mA and/or kV according to patient size and/or use of iterative reconstruction technique. COMPARISON:  05/04/2022 FINDINGS: Brain: There is atrophy and chronic small vessel disease changes. No acute intracranial abnormality. Specifically, no hemorrhage, hydrocephalus, mass lesion, acute infarction, or significant intracranial injury. Vascular: No hyperdense vessel or unexpected calcification. Skull: No acute calvarial abnormality. Sinuses/Orbits: No acute findings Other: None IMPRESSION: Atrophy, chronic microvascular disease. No acute intracranial abnormality. Electronically Signed   By: Charlett Nose M.D.   On: 03/18/2023 01:57   DG Chest Portable 1 View  Result Date: 03/18/2023 CLINICAL DATA:  Knee pain.  Altered her mental status. EXAM: PORTABLE CHEST 1 VIEW COMPARISON:  08/18/2022 FINDINGS: Heart and mediastinal contours are within normal limits. Bibasilar airspace opacities, right greater than left, no effusions. No acute bony abnormality. IMPRESSION: Bibasilar airspace  opacities, right greater than left. This could reflect atelectasis or pneumonia. Electronically Signed   By: Charlett Nose M.D.   On: 03/18/2023 01:42   DG Knee Complete 4 Views Right  Result Date: 03/18/2023 CLINICAL DATA:  Knee pain EXAM: RIGHT KNEE - COMPLETE 4+ VIEW COMPARISON:  None Available. FINDINGS: Prior right knee replacement. No hardware complicating feature. Large joint effusion. Anterior soft tissue swelling. No acute bony abnormality. Specifically, no fracture, subluxation, or dislocation. IMPRESSION: Right knee replacement. Large joint effusion. No acute bony abnormality. Electronically Signed   By: Charlett Nose M.D.   On: 03/18/2023 01:41    Pertinent labs & imaging results that were available during my care of the patient were reviewed by me and considered in my medical decision making (see MDM for details).  Medications Ordered in ED Medications  azithromycin (ZITHROMAX) 500 mg in sodium chloride 0.9 % 250 mL IVPB (500 mg Intravenous New Bag/Given 03/18/23 0255)  lactated ringers infusion (has no administration in time range)  lactated ringers bolus 1,000 mL (1,000 mLs Intravenous New Bag/Given 03/18/23 0326)  cefTRIAXone (ROCEPHIN) 1 g in sodium chloride 0.9 % 100 mL IVPB (0 g Intravenous Stopped 03/18/23 0327)  Procedures .Critical Care  Performed by: Sloan Leiter, DO Authorized by: Sloan Leiter, DO   Critical care provider statement:    Critical care time (minutes):  41   Critical care time was exclusive of:  Separately billable procedures and treating other patients   Critical care was necessary to treat or prevent imminent or life-threatening deterioration of the following conditions:  Sepsis and respiratory failure   Critical care was time spent personally by me on the following activities:  Development of treatment plan with patient or  surrogate, discussions with consultants, evaluation of patient's response to treatment, examination of patient, ordering and review of laboratory studies, ordering and review of radiographic studies, ordering and performing treatments and interventions, pulse oximetry, re-evaluation of patient's condition, review of old charts and obtaining history from patient or surrogate   Care discussed with: admitting provider     (including critical care time)  Medical Decision Making / ED Course    Medical Decision Making:    KENWOOD ROSIAK is a 77 y.o. male with past medical history as below, significant for CHF, DM2, OSA, PE, A-fib CKD stage III, anticoagulated who presents to the ED with complaint of AMS. The complaint involves an extensive differential diagnosis and also carries with it a high risk of complications and morbidity.  Serious etiology was considered. Ddx includes but is not limited to: Differential diagnoses for altered mental status includes but is not exclusive to alcohol, illicit or prescription medications, intracranial pathology such as stroke, intracerebral hemorrhage, fever or infectious causes including sepsis, hypoxemia, uremia, trauma, endocrine related disorders such as diabetes, hypoglycemia, thyroid-related diseases, etc.   Complete initial physical exam performed, notably the patient  was no acute distress, breathing well with 2 L nasal cannula.    Reviewed and confirmed nursing documentation for past medical history, family history, social history.  Vital signs reviewed.    Clinical Course as of 03/18/23 0352  Thu Mar 18, 2023  0341 HDS, no septic shock [SG]    Clinical Course User Index [SG] Sloan Leiter, DO     Patient with AMS, image reviewed, chest x-ray concerning for pneumonia.  WBC 13.6, he has tachypnea, requiring 2 L nasal cannula.  Meets criteria for sepsis.  No evidence of septic shock.  Start Rocephin azithromycin after sending blood cultures.  Possible  mild cellulitis right lower extremity. Continue 2 L nasal cannula.  AMS likely secondary to infection. Recommend admission.  Patient is agreeable                 Additional history obtained: -Additional history obtained from family -External records from outside source obtained and reviewed including: Chart review including previous notes, labs, imaging, consultation notes including  Home medications Primary care documentation   Lab Tests: -I ordered, reviewed, and interpreted labs.   The pertinent results include:   Labs Reviewed  CBC WITH DIFFERENTIAL/PLATELET - Abnormal; Notable for the following components:      Result Value   WBC 13.6 (*)    MCH 24.4 (*)    RDW 17.4 (*)    Neutro Abs 10.8 (*)    Monocytes Absolute 1.5 (*)    Abs Immature Granulocytes 0.08 (*)    All other components within normal limits  COMPREHENSIVE METABOLIC PANEL - Abnormal; Notable for the following components:   Creatinine, Ser 1.62 (*)    Albumin 3.4 (*)    GFR, Estimated 43 (*)    All other components within normal limits  BRAIN  NATRIURETIC PEPTIDE - Abnormal; Notable for the following components:   B Natriuretic Peptide 157.3 (*)    All other components within normal limits  TSH - Abnormal; Notable for the following components:   TSH 8.665 (*)    All other components within normal limits  I-STAT VENOUS BLOOD GAS, ED - Abnormal; Notable for the following components:   pO2, Ven 21 (*)    Calcium, Ion 1.11 (*)    All other components within normal limits  RESP PANEL BY RT-PCR (RSV, FLU A&B, COVID)  RVPGX2  CULTURE, BLOOD (ROUTINE X 2)  CULTURE, BLOOD (ROUTINE X 2)  T4, FREE  URINALYSIS, ROUTINE W REFLEX MICROSCOPIC  PROTIME-INR  I-STAT CG4 LACTIC ACID, ED  TROPONIN I (HIGH SENSITIVITY)  TROPONIN I (HIGH SENSITIVITY)    Notable for wbc+  EKG   EKG Interpretation Date/Time:  Thursday March 18 2023 00:18:01 EDT Ventricular Rate:  85 PR Interval:  234 QRS  Duration:  184 QT Interval:  419 QTC Calculation: 499 R Axis:   -62  Text Interpretation: Sinus rhythm Prolonged PR interval Probable left atrial enlargement RBBB and LAFB Left ventricular hypertrophy similar to prior Confirmed by Tanda Rockers ([id number]) on 03/18/2023 12:24:01 AM         Imaging Studies ordered: I ordered imaging studies including CTH CXR KNEE XR I independently visualized the following imaging with scope of interpretation limited to determining acute life threatening conditions related to emergency care; findings noted above, significant for pna, knee effusion  I independently visualized and interpreted imaging. I agree with the radiologist interpretation   Medicines ordered and prescription drug management: Meds ordered this encounter  Medications   cefTRIAXone (ROCEPHIN) 1 g in sodium chloride 0.9 % 100 mL IVPB    Order Specific Question:   Antibiotic Indication:    Answer:   CAP   azithromycin (ZITHROMAX) 500 mg in sodium chloride 0.9 % 250 mL IVPB    Order Specific Question:   Antibiotic Indication:    Answer:   CAP   lactated ringers infusion   lactated ringers bolus 1,000 mL    Order Specific Question:   Reason 30 mL/kg dose is not being ordered    Answer:   First Lactic Acid Pending    -I have reviewed the patients home medicines and have made adjustments as needed   Consultations Obtained: na   Cardiac Monitoring: The patient was maintained on a cardiac monitor.  I personally viewed and interpreted the cardiac monitored which showed an underlying rhythm of: NSR  Social Determinants of Health:  Diagnosis or treatment significantly limited by social determinants of health: former smoker and lives alone   Reevaluation: After the interventions noted above, I reevaluated the patient and found that they have improved  Co morbidities that complicate the patient evaluation  Past Medical History:  Diagnosis Date   Acute kidney failure (HCC)     Anxiety    Arthritis    Back pain    BPH (benign prostatic hypertrophy)    CHF (congestive heart failure) (HCC)    Clostridium difficile infection    Clotting disorder (HCC)    Depression    Diabetes (HCC) 12/11/2016   type 2    DVT (deep venous thrombosis) (HCC)    DVT of deep femoral vein, right (HCC) 06/29/2018   Edema, lower extremity    High cholesterol    Hypertension    Joint pain    Low back pain potentially associated with spinal stenosis  Neuromuscular disorder (HCC)    Neuropathy of lower extremity    bilateral   OSA (obstructive sleep apnea)    pt denies    Osteoarthritis    PE (pulmonary embolism)    Pneumonia    hx of x 2    Ventral hernia       Dispostion: Disposition decision including need for hospitalization was considered, and patient admitted to the hospital.    Final Clinical Impression(s) / ED Diagnoses Final diagnoses:  Community acquired pneumonia, unspecified laterality  Altered mental status, unspecified altered mental status type  Sepsis without acute organ dysfunction, due to unspecified organism (HCC)  Hypoxia        Sloan Leiter, DO 03/18/23 [phone]

## 2023-03-18 NOTE — Sepsis Progress Note (Signed)
Messaged Bedside RN about if antibiotics were given before or after blood cultures. No response. Per charting antibiotics given before cultures collected.

## 2023-03-18 NOTE — Sepsis Progress Note (Signed)
Elink monitoring for the code sepsis protocol.  

## 2023-03-18 NOTE — Progress Notes (Signed)
VASCULAR LAB    Right lower extremity venous duplex has been performed.  See CV proc for preliminary results.   Milissa Fesperman, RVT 03/18/2023, 10:05 AM

## 2023-03-18 NOTE — Progress Notes (Signed)
Hypoglycemic Event  CBG: 68  Treatment: 4 oz juice/soda  Symptoms: None  Follow-up CBG: Time:0723 CBG Result:101  Possible Reasons for Event: Unknown  Comments/MD notified:per hypoglycemia protocol    Tanner [name]

## 2023-03-18 NOTE — ED Triage Notes (Signed)
PT bib [facility] with a c/o of altered mental status with a LNK of Sunday after he fell.PT is on eliquis, hx of bells palsy and a had a right knee replacement recently which he hit when he fell on Sunday. Knee is red and legs have cellulitis a/ weeping/edema.PT can noyt have a MRI due to shrapnel. PT was 88%RA and placed on 4l of 02, PT is currently on 2l and stating at 97%

## 2023-03-18 NOTE — ED Notes (Signed)
Please update family when plan of care is determined.

## 2023-03-18 NOTE — ED Notes (Signed)
ED TO INPATIENT HANDOFF REPORT  ED Nurse Name and Phone #: Angelique Holm RN [phone]  S Name/Age/Gender Tanner [name] 77 y.o. male Room/Bed: 83C/042C  Code Status   Code Status: Prior  Home/SNF/Other Home Patient oriented to: self, place, time, and situation Is this baseline? Yes   Triage Complete: Triage complete  Chief Complaint CAP (community acquired pneumonia) [J18.9]  Triage Note PT bib [facility] with a c/o of altered mental status with a LNK of Sunday after he fell.PT is on eliquis, hx of bells palsy and a had a right knee replacement recently which he hit when he fell on Sunday. Knee is red and legs have cellulitis a/ weeping/edema.PT can noyt have a MRI due to shrapnel. PT was 88%RA and placed on 4l of 02, PT is currently on 2l and stating at 97%   Allergies Allergies  Allergen Reactions   Lisinopril Other (See Comments)     Renal impairment   Amlodipine Swelling   Codeine Sulfate Itching and Nausea Only    Level of Care/Admitting Diagnosis ED Disposition     ED Disposition  Admit   Condition  --   Comment  Hospital [facility] [[id number]]  Level of Care: Telemetry Medical [[id number]]  May admit patient to [facility] or [facility] if equivalent level of care is available:: Yes  Covid Evaluation: Asymptomatic - no recent exposure (last 10 days) testing not required  Diagnosis: CAP (community acquired pneumonia) [846962]  Admitting Physician: Darlin Drop [[id number]]  Attending Physician: Darlin Drop [[id number]]  Certification:: I certify this patient will need inpatient services for at least 2 midnights  Expected Medical Readiness: 03/20/2023          B Medical/Surgery History Past Medical History:  Diagnosis Date   Acute kidney failure (HCC)    Anxiety    Arthritis    Back pain    BPH (benign prostatic hypertrophy)    CHF (congestive heart failure) (HCC)    Clostridium difficile infection    Clotting disorder (HCC)    Depression     Diabetes (HCC) 12/11/2016   type 2    DVT (deep venous thrombosis) (HCC)    DVT of deep femoral vein, right (HCC) 06/29/2018   Edema, lower extremity    High cholesterol    Hypertension    Joint pain    Low back pain potentially associated with spinal stenosis    Neuromuscular disorder (HCC)    Neuropathy of lower extremity    bilateral   OSA (obstructive sleep apnea)    pt denies    Osteoarthritis    PE (pulmonary embolism)    Pneumonia    hx of x 2    Ventral hernia    Past Surgical History:  Procedure Laterality Date   CARDIOVERSION N/A 08/19/2022   Procedure: CARDIOVERSION;  Surgeon: Meriam Sprague, MD;  Location: [facility] ENDOSCOPY;  Service: Cardiovascular;  Laterality: N/A;   EYE SURGERY     HERNIA REPAIR  1999   TOTAL KNEE ARTHROPLASTY Left 08/26/2020   Procedure: LEFT TOTAL KNEE ARTHROPLASTY;  Surgeon: Gean Birchwood, MD;  Location: [facility] ORS;  Service: Orthopedics;  Laterality: Left;   TOTAL KNEE ARTHROPLASTY Right 12/15/2022   Procedure: TOTAL KNEE ARTHROPLASTY;  Surgeon: Durene Romans, MD;  Location: [facility] ORS;  Service: Orthopedics;  Laterality: Right;     A IV Location/Drains/Wounds Patient Lines/Drains/Airways Status     Active Line/Drains/Airways     Name Placement date Placement time Site Days  Peripheral IV 03/18/23 20 G Posterior;Right Forearm 03/18/23  0206  Forearm  less than 1   Peripheral IV 03/18/23 20 G 1" Left;Posterior Hand 03/18/23  0323  Hand  less than 1            Intake/Output Last 24 hours No intake or output data in the 24 hours ending 03/18/23 0530  Labs/Imaging Results for orders placed or performed during the hospital encounter of 03/18/23 (from the past 48 hour(s))  CBC with Differential     Status: Abnormal   Collection Time: 03/18/23  2:07 AM  Result Value Ref Range   WBC 13.6 (H) 4.0 - 10.5 K/uL   RBC 5.33 4.22 - 5.81 MIL/uL   Hemoglobin 13.0 13.0 - 17.0 g/dL   HCT 16.1 09.6 - 04.5 %   MCV 80.3 80.0 - 100.0 fL   MCH 24.4 (L)  26.0 - 34.0 pg   MCHC 30.4 30.0 - 36.0 g/dL   RDW 40.9 (H) 81.1 - 91.4 %   Platelets 197 150 - 400 K/uL   nRBC 0.0 0.0 - 0.2 %   Neutrophils Relative % 80 %   Neutro Abs 10.8 (H) 1.7 - 7.7 K/uL   Lymphocytes Relative 8 %   Lymphs Abs 1.1 0.7 - 4.0 K/uL   Monocytes Relative 11 %   Monocytes Absolute 1.5 (H) 0.1 - 1.0 K/uL   Eosinophils Relative 0 %   Eosinophils Absolute 0.0 0.0 - 0.5 K/uL   Basophils Relative 0 %   Basophils Absolute 0.1 0.0 - 0.1 K/uL   Immature Granulocytes 1 %   Abs Immature Granulocytes 0.08 (H) 0.00 - 0.07 K/uL    Comment: Performed at [facility], [address], [place], [place] [address]  Comprehensive metabolic panel     Status: Abnormal   Collection Time: 03/18/23  2:07 AM  Result Value Ref Range   Sodium 137 135 - 145 mmol/L   Potassium 4.1 3.5 - 5.1 mmol/L   Chloride 101 98 - 111 mmol/L   CO2 23 22 - 32 mmol/L   Glucose, Bld 80 70 - 99 mg/dL    Comment: Glucose reference range applies only to samples taken after fasting for at least 8 hours.   BUN 18 8 - 23 mg/dL   Creatinine, Ser 6.21 (H) 0.61 - 1.24 mg/dL   Calcium 9.1 8.9 - 30.8 mg/dL   Total Protein 6.8 6.5 - 8.1 g/dL   Albumin 3.4 (L) 3.5 - 5.0 g/dL   AST 15 15 - 41 U/L   ALT 16 0 - 44 U/L   Alkaline Phosphatase 85 38 - 126 U/L   Total Bilirubin 1.2 0.3 - 1.2 mg/dL   GFR, Estimated 43 (L) >60 mL/min    Comment: (NOTE) Calculated using the CKD-EPI Creatinine Equation (2021)    Anion gap 13 5 - 15    Comment: Performed at [facility], [address] [place], [place] [address]  Troponin I (High Sensitivity)     Status: None   Collection Time: 03/18/23  2:07 AM  Result Value Ref Range   Troponin I (High Sensitivity) 14 <18 ng/L    Comment: (NOTE) Elevated high sensitivity troponin I (hsTnI) values and significant  changes across serial measurements may suggest ACS but many other  chronic and acute conditions are known to elevate hsTnI results.  Refer to the "Links" section  for chest pain algorithms and additional  guidance. Performed at [facility], [address] [place], [place] [address]   Brain natriuretic peptide     Status: Abnormal   Collection Time: 03/18/23  2:07 AM  Result Value Ref Range   B Natriuretic Peptide 157.3 (H) 0.0 - 100.0 pg/mL    Comment: Performed at [facility], [address], [place], [place] [address]  TSH     Status: Abnormal   Collection Time: 03/18/23  2:07 AM  Result Value Ref Range   TSH 8.665 (H) 0.350 - 4.500 uIU/mL    Comment: Performed by a 3rd Generation assay with a functional sensitivity of <=0.01 uIU/mL. Performed at [facility], [address] [place], [place] [address]   T4, free     Status: None   Collection Time: 03/18/23  2:07 AM  Result Value Ref Range   Free T4 0.65 0.61 - 1.12 ng/dL    Comment: (NOTE) Biotin ingestion may interfere with free T4 tests. If the results are inconsistent with the TSH level, previous test results, or the clinical presentation, then consider biotin interference. If needed, order repeat testing after stopping biotin. Performed at [facility], [address] [place], [place] [address]   I-Stat venous blood gas, [facility] ED, [facility], [facility])     Status: Abnormal   Collection Time: 03/18/23  2:11 AM  Result Value Ref Range   pH, Ven 7.374 7.25 - 7.43   pCO2, Ven 44.6 44 - 60 mmHg   pO2, Ven 21 (LL) 32 - 45 mmHg   Bicarbonate 26.0 20.0 - 28.0 mmol/L   TCO2 27 22 - 32 mmol/L   O2 Saturation 33 %   Acid-Base Excess 0.0 0.0 - 2.0 mmol/L   Sodium 139 135 - 145 mmol/L   Potassium 4.1 3.5 - 5.1 mmol/L   Calcium, Ion 1.11 (L) 1.15 - 1.40 mmol/L   HCT 45.0 39.0 - 52.0 %   Hemoglobin 15.3 13.0 - 17.0 g/dL   Sample type VENOUS    Comment NOTIFIED PHYSICIAN   Troponin I (High Sensitivity)     Status: None   Collection Time: 03/18/23  3:23 AM  Result Value Ref Range   Troponin I (High Sensitivity) 17 <18 ng/L    Comment: (NOTE) Elevated high sensitivity  troponin I (hsTnI) values and significant  changes across serial measurements may suggest ACS but many other  chronic and acute conditions are known to elevate hsTnI results.  Refer to the "Links" section for chest pain algorithms and additional  guidance. Performed at [facility], [address], [place], [place] [address]   Protime-INR     Status: Abnormal   Collection Time: 03/18/23  3:25 AM  Result Value Ref Range   Prothrombin Time 18.4 (H) 11.4 - 15.2 seconds   INR 1.5 (H) 0.8 - 1.2    Comment: (NOTE) INR goal varies based on device and disease states. Performed at [facility], [address] [place], [place] [address]   I-Stat Lactic Acid, ED     Status: None   Collection Time: 03/18/23  3:27 AM  Result Value Ref Range   Lactic Acid, Venous 1.6 0.5 - 1.9 mmol/L  Resp panel by RT-PCR (RSV, Flu A&B, Covid) Anterior Nasal Swab     Status: None   Collection Time: 03/18/23  3:32 AM   Specimen: Anterior Nasal Swab  Result Value Ref Range   SARS Coronavirus 2 by RT PCR NEGATIVE NEGATIVE   Influenza A by PCR NEGATIVE NEGATIVE   Influenza  B by PCR NEGATIVE NEGATIVE    Comment: (NOTE) The Xpert Xpress SARS-CoV-2/FLU/RSV plus assay is intended as an aid in the diagnosis of influenza from Nasopharyngeal swab specimens and should not be used as a sole basis for treatment. Nasal washings and aspirates are unacceptable for Xpert Xpress SARS-CoV-2/FLU/RSV testing.  Fact Sheet for Patients: BloggerCourse.com  Fact Sheet for Healthcare Providers: SeriousBroker.it  This test is not yet approved or cleared by the [place] FDA and has been authorized for detection and/or diagnosis of SARS-CoV-2 by FDA under an Emergency Use Authorization (EUA). This EUA will remain in effect (meaning this test can be used) for the duration of the COVID-19 declaration under Section 564(b)(1) of the Act, 21 U.S.C. section  360bbb-3(b)(1), unless the authorization is terminated or revoked.     Resp Syncytial Virus by PCR NEGATIVE NEGATIVE    Comment: (NOTE) Fact Sheet for Patients: BloggerCourse.com  Fact Sheet for Healthcare Providers: SeriousBroker.it  This test is not yet approved or cleared by the [place] FDA and has been authorized for detection and/or diagnosis of SARS-CoV-2 by FDA under an Emergency Use Authorization (EUA). This EUA will remain in effect (meaning this test can be used) for the duration of the COVID-19 declaration under Section 564(b)(1) of the Act, 21 U.S.C. section 360bbb-3(b)(1), unless the authorization is terminated or revoked.  Performed at [facility], [address] [place], [place] [address]    CT Head Wo Contrast  Result Date: 03/18/2023 CLINICAL DATA:  Altered mental status EXAM: CT HEAD WITHOUT CONTRAST TECHNIQUE: Contiguous axial images were obtained from the base of the skull through the vertex without intravenous contrast. RADIATION DOSE REDUCTION: This exam was performed according to the departmental dose-optimization program which includes automated exposure control, adjustment of the mA and/or kV according to patient size and/or use of iterative reconstruction technique. COMPARISON:  05/04/2022 FINDINGS: Brain: There is atrophy and chronic small vessel disease changes. No acute intracranial abnormality. Specifically, no hemorrhage, hydrocephalus, mass lesion, acute infarction, or significant intracranial injury. Vascular: No hyperdense vessel or unexpected calcification. Skull: No acute calvarial abnormality. Sinuses/Orbits: No acute findings Other: None IMPRESSION: Atrophy, chronic microvascular disease. No acute intracranial abnormality. Electronically Signed   By: Charlett Nose M.D.   On: 03/18/2023 01:57   DG Chest Portable 1 View  Result Date: 03/18/2023 CLINICAL DATA:  Knee pain.  Altered her mental  status. EXAM: PORTABLE CHEST 1 VIEW COMPARISON:  08/18/2022 FINDINGS: Heart and mediastinal contours are within normal limits. Bibasilar airspace opacities, right greater than left, no effusions. No acute bony abnormality. IMPRESSION: Bibasilar airspace opacities, right greater than left. This could reflect atelectasis or pneumonia. Electronically Signed   By: Charlett Nose M.D.   On: 03/18/2023 01:42   DG Knee Complete 4 Views Right  Result Date: 03/18/2023 CLINICAL DATA:  Knee pain EXAM: RIGHT KNEE - COMPLETE 4+ VIEW COMPARISON:  None Available. FINDINGS: Prior right knee replacement. No hardware complicating feature. Large joint effusion. Anterior soft tissue swelling. No acute bony abnormality. Specifically, no fracture, subluxation, or dislocation. IMPRESSION: Right knee replacement. Large joint effusion. No acute bony abnormality. Electronically Signed   By: Charlett Nose M.D.   On: 03/18/2023 01:41    Pending Labs Unresulted Labs (From admission, onward)     Start     Ordered   03/18/23 0251  Blood Culture (routine x 2)  (Septic presentation on arrival (screening labs, nursing and treatment orders for obvious sepsis))  BLOOD CULTURE X 2,   STAT  03/18/23 0252   03/18/23 0114  Urinalysis, Routine w reflex microscopic -Urine, Clean Catch  Once,   URGENT       Question:  Specimen Source  Answer:  Urine, Clean Catch   03/18/23 0113            Vitals/Pain Today's Vitals   03/18/23 0016 03/18/23 0021 03/18/23 0025 03/18/23 0230  BP: (!) 155/66   (!) 154/111  Pulse: 95   78  Resp: (!) 24   (!) 24  Temp: 98.3 F (36.8 C)     TempSrc: Oral     SpO2: 90% 95%  98%  Weight:   231 lb 14.8 oz (105.2 kg)   Height:   5' 5.5" (1.664 m)     Isolation Precautions No active isolations  Medications Medications  lactated ringers infusion ( Intravenous New Bag/Given 03/18/23 0402)  cefTRIAXone (ROCEPHIN) 1 g in sodium chloride 0.9 % 100 mL IVPB (0 g Intravenous Stopped 03/18/23 0327)   azithromycin (ZITHROMAX) 500 mg in sodium chloride 0.9 % 250 mL IVPB (0 mg Intravenous Stopped 03/18/23 0402)  lactated ringers bolus 1,000 mL (0 mLs Intravenous Stopped 03/18/23 0435)    Mobility walks with device     Focused Assessments Neuro Assessment Handoff:  Swallow screen pass?  Not needed/ordered     Last date known well: 03/14/23   Neuro Assessment: Within Defined Limits Neuro Checks:      Has TPA been given? No If patient is a Neuro Trauma and patient is going to OR before floor call report to 4N Charge nurse: [phone] or [phone]   R Recommendations: See Admitting Provider Note  Report given to:   Additional Notes: Patient lives at home alone at baseline

## 2023-03-18 NOTE — ED Notes (Signed)
Pt o2 90 on room air, RN notified and 2L oxygen applied

## 2023-03-18 NOTE — Progress Notes (Signed)
Hypoglycemic Event  CBG: 69  Treatment: 4 oz juice/soda  Symptoms: None  Follow-up CBG: Time:0639 CBG Result:68  Possible Reasons for Event: Unknown  Comments/MD notified:Per hypoglycemia protocol    Tanner [name]

## 2023-03-18 NOTE — Evaluation (Signed)
Physical Therapy Evaluation Patient Details Name: Tanner [name] MRN: [id number] DOB: [birth year] Today's Date: 03/18/2023  History of Present Illness  77 yo male [facility] to [facility] on 10/3 with AMS, fall, SOB. R knee xray shows large effusion. Pt with sepsis due to bibasilar aspiration PNA with associated respiratory failure, RLE cellulitis. PMH includes R TKR 12/15/2022, L TKR 2022, anxiety, BPH, HF, depression, DMII, DVT, HTN, PE, afib, CKDIII.  Clinical Impression   Pt presents with debility, impaired balance with heavy R bias in sitting and standing, RLE weakness and pain, R inattention, and decreased activity tolerance. Pt to benefit from acute PT to address deficits. Pt requiring max assist for bed mobility and transfer into standing x1, unable to progress to gait given R bias and very short tolerance for standing. PT concerned about  RUE numbness, R inattention, R bias, and dramatic change in function, RN and MD notified. Patient will benefit from continued inpatient follow up therapy, <3 hours/day. PT to progress mobility as tolerated, and will continue to follow acutely.          If plan is discharge home, recommend the following: Two people to help with walking and/or transfers;Two people to help with bathing/dressing/bathroom   Can travel by private vehicle        Equipment Recommendations None recommended by PT  Recommendations for Other Services       Functional Status Assessment Patient has had a recent decline in their functional status and demonstrates the ability to make significant improvements in function in a reasonable and predictable amount of time.     Precautions / Restrictions Precautions Precautions: Fall Precaution Comments: R TKR 12/2022, RLE cellulitis Restrictions Weight Bearing Restrictions: No      Mobility  Bed Mobility Overal bed mobility: Needs Assistance Bed Mobility: Supine to Sit, Sit to Supine     Supine to sit: Max assist, HOB elevated, Used  rails Sit to supine: Max assist, HOB elevated, Used rails   General bed mobility comments: max assist for trunk and LE management, max sequencing cues    Transfers Overall transfer level: Needs assistance Equipment used: Rolling walker (2 wheels) Transfers: Sit to/from Stand Sit to Stand: Max assist, From elevated surface           General transfer comment: assist for power up, rise, steady. Max multimodal cues, heavy R lateral bias. Stand attempts x3, x1 successful attempt    Ambulation/Gait                  Stairs            Wheelchair Mobility     Tilt Bed    Modified Rankin (Stroke Patients Only)       Balance Overall balance assessment: Needs assistance, History of Falls Sitting-balance support: Feet supported, Bilateral upper extremity supported Sitting balance-Leahy Scale: Poor Sitting balance - Comments: heavy R bias and cannot correct without mod PT trunk support Postural control: Right lateral lean Standing balance support: Reliant on assistive device for balance, Bilateral upper extremity supported Standing balance-Leahy Scale: Zero Standing balance comment: max assist                             Pertinent Vitals/Pain Pain Assessment Pain Assessment: Faces Faces Pain Scale: Hurts little more Pain Location: RLE Pain Descriptors / Indicators: Discomfort, Grimacing, Guarding Pain Intervention(s): Limited activity within patient's tolerance, Monitored during session, Repositioned    Home Living Family/patient expects to  be discharged to:: Private residence Living Arrangements: Alone Available Help at Discharge: Family Type of Home: House Home Access: Stairs to enter Entrance Stairs-Rails: Right Entrance Stairs-Number of Steps: 4-5   Home Layout: One level Home Equipment: Agricultural consultant (2 wheels);Cane - single point      Prior Function Prior Level of Function : Independent/Modified Independent             Mobility  Comments: pt reports using RW PTA, states he has fallen 6-8 times since TKR       Extremity/Trunk Assessment   Upper Extremity Assessment Upper Extremity Assessment: Defer to OT evaluation (pt endorses RUE numbness, not baseline and pt is R handed)    Lower Extremity Assessment Lower Extremity Assessment: Generalized weakness;RLE deficits/detail;Difficult to assess due to impaired cognition RLE Deficits / Details: distal redness and swelling; TKR in 12/2022. strength screened in supine, limited by cognition but at least 3+/5 knee flexion/extension and 1/2 AROM DF/PF. Pt reports sensation WFL vs LLE RLE Sensation: WNL    Cervical / Trunk Assessment Cervical / Trunk Assessment: Normal  Communication   Communication Communication: No apparent difficulties Cueing Techniques: Verbal cues;Gestural cues  Cognition Arousal: Lethargic Behavior During Therapy: Flat affect Overall Cognitive Status: Impaired/Different from baseline Area of Impairment: Orientation, Following commands, Safety/judgement, Problem solving                 Orientation Level: Disoriented to, Place, Time, Situation     Following Commands: Follows one step commands with increased time Safety/Judgement: Decreased awareness of deficits, Decreased awareness of safety   Problem Solving: Decreased initiation, Slow processing, Difficulty sequencing, Requires verbal cues, Requires tactile cues General Comments: Pt states it is "January [birth year], 1976" and is not shocked when PT reorients him. Pt with slowed processing, poor awareness of situation ("I'm going to pee myself" when he has an external catheter on). Max step-by-step cuing        General Comments General comments (skin integrity, edema, etc.): on RA upon PT arrival to room, left on RA    Exercises General Exercises - Lower Extremity Ankle Circles/Pumps: AROM, Both, Supine, 5 reps   Assessment/Plan    PT Assessment Patient needs continued PT services  PT  Problem List Decreased strength;Decreased mobility;Decreased safety awareness;Decreased range of motion;Decreased activity tolerance;Decreased balance;Decreased knowledge of use of DME;Pain;Cardiopulmonary status limiting activity;Decreased knowledge of precautions;Decreased coordination;Decreased cognition       PT Treatment Interventions DME instruction;Therapeutic activities;Gait training;Therapeutic exercise;Patient/family education;Balance training;Stair training;Functional mobility training;Neuromuscular re-education    PT Goals (Current goals can be found in the Care Plan section)  Acute Rehab PT Goals PT Goal Formulation: With patient Time For Goal Achievement: 04/01/23 Potential to Achieve Goals: Fair    Frequency Min 1X/week     Co-evaluation               AM-PAC PT "6 Clicks" Mobility  Outcome Measure Help needed turning from your back to your side while in a flat bed without using bedrails?: A Lot Help needed moving from lying on your back to sitting on the side of a flat bed without using bedrails?: A Lot Help needed moving to and from a bed to a chair (including a wheelchair)?: Total Help needed standing up from a chair using your arms (e.g., wheelchair or bedside chair)?: Total Help needed to walk in hospital room?: Total Help needed climbing 3-5 steps with a railing? : Total 6 Click Score: 8    End of Session   Activity Tolerance: Patient  limited by fatigue;Patient limited by lethargy Patient left: in bed;with call bell/phone within reach;with bed alarm set Nurse Communication: Mobility status;Other (comment) (concerns about pt cognition, RUE numbness, R inattention) PT Visit Diagnosis: Other abnormalities of gait and mobility (R26.89);Muscle weakness (generalized) (M62.81)    Time: 1610-9604 PT Time Calculation (min) (ACUTE ONLY): 20 min   Charges:   PT Evaluation $PT Eval Low Complexity: 1 Low   PT General Charges $$ ACUTE PT VISIT: 1 Visit          Marye Round, PT DPT Acute Rehabilitation Services Secure Chat Preferred  Office [phone]   Tanner [name] Sheliah Plane 03/18/2023, 4:57 PM

## 2023-03-18 NOTE — H&P (Addendum)
History and Physical  NORMAN MONOHAN ZYS:[id number] DOB: 04/13/1946 DOA: 03/18/2023  Referring physician: Dr. Wallace Cullens, EDP  PCP: Myrlene Broker, MD  Outpatient Specialists: Cardiology, orthopedic surgery. Patient coming from: Home.  Chief Complaint: Altered mental status, cough.  HPI: Tanner [name] is a 77 y.o. male with medical history significant for paroxysmal A-fib on Eliquis, essential hypertension, hyperlipidemia, HFpEF, CKD 3A, who presented to the ED with altered mental status.  Associated with a productive cough, body aches, for the last few days.  Upon EMS arrival, the patient was noted to be hypoxic with O2 saturation in the 80s on room air.  Was placed on 4 L nasal cannula with improvement of his saturation to 97%.  Endorses a fall last week, injuring his right knee, now with right lower extremity edema.  In the ED, the patient is more alert, on O2 supplementation.  Lab studies notable for leukocytosis 13.6.  Chest x-ray showing bibasilar opacities right greater than left with suspicion for pneumonia.  The patient was started on Rocephin and azithromycin for community-acquired pneumonia.  Admitted by [facility], hospitalist service.  ED Course: Temperature 98.3.  BP 155/66, pulse 95, respiratory 24, saturation 95% on 2 L.  Lab studies notable for WBC 13.6, creatinine 1.62 with GFR 43, BNP 157.  Review of Systems: Review of systems as noted in the HPI. All other systems reviewed and are negative.   Past Medical History:  Diagnosis Date   Acute kidney failure (HCC)    Anxiety    Arthritis    Back pain    BPH (benign prostatic hypertrophy)    CHF (congestive heart failure) (HCC)    Clostridium difficile infection    Clotting disorder (HCC)    Depression    Diabetes (HCC) 12/11/2016   type 2    DVT (deep venous thrombosis) (HCC)    DVT of deep femoral vein, right (HCC) 06/29/2018   Edema, lower extremity    High cholesterol    Hypertension    Joint pain    Low back pain  potentially associated with spinal stenosis    Neuromuscular disorder (HCC)    Neuropathy of lower extremity    bilateral   OSA (obstructive sleep apnea)    pt denies    Osteoarthritis    PE (pulmonary embolism)    Pneumonia    hx of x 2    Ventral hernia    Past Surgical History:  Procedure Laterality Date   CARDIOVERSION N/A 08/19/2022   Procedure: CARDIOVERSION;  Surgeon: Meriam Sprague, MD;  Location: [facility] ENDOSCOPY;  Service: Cardiovascular;  Laterality: N/A;   EYE SURGERY     HERNIA REPAIR  1999   TOTAL KNEE ARTHROPLASTY Left 08/26/2020   Procedure: LEFT TOTAL KNEE ARTHROPLASTY;  Surgeon: Gean Birchwood, MD;  Location: [facility] ORS;  Service: Orthopedics;  Laterality: Left;   TOTAL KNEE ARTHROPLASTY Right 12/15/2022   Procedure: TOTAL KNEE ARTHROPLASTY;  Surgeon: Durene Romans, MD;  Location: [facility] ORS;  Service: Orthopedics;  Laterality: Right;    Social History:  reports that he quit smoking about 21 years ago. His smoking use included cigarettes. He started smoking about 54 years ago. He has a 66.1 pack-year smoking history. He has never used smokeless tobacco. He reports current alcohol use. He reports that he does not use drugs.   Allergies  Allergen Reactions   Lisinopril Other (See Comments)     Renal impairment   Amlodipine Swelling   Codeine Sulfate Itching and Nausea Only  Family History  Problem Relation Age of Onset   Diabetes Mother    Pneumonia Mother    Alzheimer's disease Mother    Hyperlipidemia Mother    Thyroid disease Mother    Depression Mother    Other Father 72       Drowned on boating accident   Colon cancer Neg Hx    Colon polyps Neg Hx       Prior to Admission medications   Medication Sig Start Date End Date Taking? Authorizing Provider  acetaminophen (TYLENOL) 500 MG tablet Take 1,000 mg by mouth every 6 (six) hours as needed for moderate pain. 02/21/13   Norins, Rosalyn Gess, MD  acidophilus (RISAQUAD) CAPS capsule Take 1 capsule by mouth daily.     [provider]  amiodarone (PACERONE) 200 MG tablet Take 1 tablet (200 mg total) by mouth daily. 10/21/22   Maisie Fus, MD  apixaban (ELIQUIS) 5 MG TABS tablet Take 5 mg by mouth 2 (two) times daily.    [provider]  atorvastatin (LIPITOR) 80 MG tablet Take 1 tablet (80 mg total) by mouth daily. 09/17/22 12/15/22  Myrlene Broker, MD  buPROPion (WELLBUTRIN SR) 150 MG 12 hr tablet Take 150 mg by mouth 2 (two) times daily.    [provider]  carvedilol (COREG) 6.25 MG tablet Take 1 tablet (6.25 mg total) by mouth 2 (two) times daily with a meal. 09/17/22 03/16/23  Myrlene Broker, MD  clobetasol ointment (TEMOVATE) 0.05 % Apply 1 Application topically daily as needed (irritation). 09/15/21   [provider]  empagliflozin (JARDIANCE) 10 MG TABS tablet Take 1 tablet (10 mg total) by mouth daily. 10/01/22   Corwin Levins, MD  finasteride (PROSCAR) 5 MG tablet Take 5 mg by mouth at bedtime.    [provider]  gabapentin (NEURONTIN) 300 MG capsule Take 300-600 mg by mouth See admin instructions. Take 600 mg by mouth in the morning, afternoon and evening and take 300 mg at night    [provider]  lidocaine (XYLOCAINE) 2 % solution Use as directed 15 mLs in the mouth or throat as needed for mouth pain. 07/17/22   Myrlene Broker, MD  Menthol-Methyl Salicylate (THERA-GESIC PLUS) CREA Apply 1 application topically at bedtime. Apply to lower back    [provider]  methocarbamol (ROBAXIN) 500 MG tablet Take 1 tablet (500 mg total) by mouth every 6 (six) hours as needed for muscle spasms. 12/16/22   Cassandria Anger, PA-C  Multiple Vitamins-Minerals (MULTIVITAMINS THER. W/MINERALS) TABS Take 1 tablet by mouth daily.    [provider]  nortriptyline (PAMELOR) 10 MG capsule 2 CAPSULES EVERY NIGHT AT BEDTIME 12/20/17   Patel, Roxana Hires K, DO  oxyCODONE (OXY IR/ROXICODONE) 5 MG immediate release tablet Take 1 tablet (5 mg total) by  mouth every 4 (four) hours as needed for severe pain. 12/16/22   Cassandria Anger, PA-C  polyethylene glycol (MIRALAX / GLYCOLAX) 17 g packet Take 17 g by mouth 2 (two) times daily. 12/16/22   Cassandria Anger, PA-C  saccharomyces boulardii (FLORASTOR) 250 MG capsule Take 1 capsule (250 mg total) by mouth 2 (two) times daily. 05/31/17   Rolly Salter, MD  Semaglutide, 1 MG/DOSE, 4 MG/3ML SOPN Inject 1 mg as directed once a week. Patient not taking: Reported on 02/10/2023 05/26/22   Myrlene Broker, MD  Semaglutide, 2 MG/DOSE, 8 MG/3ML SOPN Inject 2 mg as directed once a week. Patient not taking:  Reported on 02/10/2023 05/26/22   Myrlene Broker, MD  sildenafil (VIAGRA) 100 MG tablet Take 100 mg by mouth as needed for erectile dysfunction. 02/27/20   [provider]  torsemide (DEMADEX) 20 MG tablet TAKE 1 TABLET BY MOUTH EVERY DAY 01/18/23   Myrlene Broker, MD    Physical Exam: BP (!) 154/111   Pulse 78   Temp 98.3 F (36.8 C) (Oral)   Resp (!) 24   Ht 5' 5.5" (1.664 m)   Wt 105.2 kg   SpO2 98%   BMI 38.01 kg/m   General: 77 y.o. year-old male well developed well nourished in no acute distress.  Alert and oriented x3. Cardiovascular: Regular rate and rhythm with no rubs or gallops.  No thyromegaly or JVD noted.  Unilateral right lower extremity edema.   Respiratory: Clear to auscultation with no wheezes or rales. Good inspiratory effort. Abdomen: Soft nontender nondistended with normal bowel sounds x4 quadrants. Muskuloskeletal: No cyanosis or clubbing noted bilaterally Neuro: CN II-XII intact, strength, sensation, reflexes Skin: No ulcerative lesions noted or rashes Psychiatry: Judgement and insight appear normal. Mood is appropriate for condition and setting          Labs on Admission:  Basic Metabolic Panel: Recent Labs  Lab 03/18/23 0207 03/18/23 0211  NA 137 139  K 4.1 4.1  CL 101  --   CO2 23  --   GLUCOSE 80  --   BUN 18  --   CREATININE  1.62*  --   CALCIUM 9.1  --    Liver Function Tests: Recent Labs  Lab 03/18/23 0207  AST 15  ALT 16  ALKPHOS 85  BILITOT 1.2  PROT 6.8  ALBUMIN 3.4*   No results for input(s): "LIPASE", "AMYLASE" in the last 168 hours. No results for input(s): "AMMONIA" in the last 168 hours. CBC: Recent Labs  Lab 03/18/23 0207 03/18/23 0211  WBC 13.6*  --   NEUTROABS 10.8*  --   HGB 13.0 15.3  HCT 42.8 45.0  MCV 80.3  --   PLT 197  --    Cardiac Enzymes: No results for input(s): "CKTOTAL", "CKMB", "CKMBINDEX", "TROPONINI" in the last 168 hours.  BNP (last 3 results) Recent Labs    06/17/22 1054 08/19/22 0104 03/18/23 0207  BNP 68.3 200.4* 157.3*    ProBNP (last 3 results) No results for input(s): "PROBNP" in the last 8760 hours.  CBG: No results for input(s): "GLUCAP" in the last 168 hours.  Radiological Exams on Admission: CT Head Wo Contrast  Result Date: 03/18/2023 CLINICAL DATA:  Altered mental status EXAM: CT HEAD WITHOUT CONTRAST TECHNIQUE: Contiguous axial images were obtained from the base of the skull through the vertex without intravenous contrast. RADIATION DOSE REDUCTION: This exam was performed according to the departmental dose-optimization program which includes automated exposure control, adjustment of the mA and/or kV according to patient size and/or use of iterative reconstruction technique. COMPARISON:  05/04/2022 FINDINGS: Brain: There is atrophy and chronic small vessel disease changes. No acute intracranial abnormality. Specifically, no hemorrhage, hydrocephalus, mass lesion, acute infarction, or significant intracranial injury. Vascular: No hyperdense vessel or unexpected calcification. Skull: No acute calvarial abnormality. Sinuses/Orbits: No acute findings Other: None IMPRESSION: Atrophy, chronic microvascular disease. No acute intracranial abnormality. Electronically Signed   By: Charlett Nose M.D.   On: 03/18/2023 01:57   DG Chest Portable 1 View  Result  Date: 03/18/2023 CLINICAL DATA:  Knee pain.  Altered her mental status. EXAM: PORTABLE CHEST 1  VIEW COMPARISON:  08/18/2022 FINDINGS: Heart and mediastinal contours are within normal limits. Bibasilar airspace opacities, right greater than left, no effusions. No acute bony abnormality. IMPRESSION: Bibasilar airspace opacities, right greater than left. This could reflect atelectasis or pneumonia. Electronically Signed   By: Charlett Nose M.D.   On: 03/18/2023 01:42   DG Knee Complete 4 Views Right  Result Date: 03/18/2023 CLINICAL DATA:  Knee pain EXAM: RIGHT KNEE - COMPLETE 4+ VIEW COMPARISON:  None Available. FINDINGS: Prior right knee replacement. No hardware complicating feature. Large joint effusion. Anterior soft tissue swelling. No acute bony abnormality. Specifically, no fracture, subluxation, or dislocation. IMPRESSION: Right knee replacement. Large joint effusion. No acute bony abnormality. Electronically Signed   By: Charlett Nose M.D.   On: 03/18/2023 01:41    EKG: I independently viewed the EKG done and my findings are as followed: Sinus rhythm rate of 85.  Nonspecific ST-T changes.  QTc 499.  Assessment/Plan Present on Admission:  CAP (community acquired pneumonia)  Principal Problem:   CAP (community acquired pneumonia)  Bibasilar pneumonia/community-acquired pneumonia, POA Productive cough for the past 2 days Obtain baseline procalcitonin Continue Rocephin and IV azithromycin initiated in the ED Antitussives as needed  Acute hypoxic respiratory failure secondary to the above Not on oxygen supplementation at baseline Continue to treat underlying conditions Maintain O2 saturation above 92%  Hypertension BP is not at goal, elevated Resume home Coreg Closely monitor vital signs  Type 2 diabetes with hyperglycemia Obtain hemoglobin A1c Start insulin sliding scale  Generalized weakness with fall Fell 1 week ago PT OT assessment Fall precautions  Unilateral right lower  extremity edema post fall Right knee x-ray positive for large effusion, also edema from knee down History of right total knee replacement in July 2024 by Dr. Charlann Boxer Consider orthopedic surgery consult in the morning to assess his right knee Follow right leg Doppler ultrasound.  Recent history of right knee replacement, posttrauma with joint effusion Follows with Dr. Charlann Boxer outpatient Consider orthopedic surgery's consultation in the morning   Time: 75 minutes.   DVT prophylaxis: Home Eliquis  Code Status: Full code  Family Communication: None at bedside  Disposition Plan: Admitted to telemetry medical unit  Consults called: None.  Admission status: Inpatient status.   Status is: Inpatient The patient requires at least 2 midnights for further evaluation and treatment of present condition.   Darlin Drop MD [facility] Pager [phone]  If 7PM-7AM, please contact night-coverage [email or web address] Password [facility]  03/18/2023, 5:33 AM

## 2023-03-18 NOTE — Consult Note (Signed)
Reason for Consult:Right knee effusion Referring Physician: Candelaria Stagers Time called: 1106 Time at bedside: 1152   Tanner [name] is an 77 y.o. male.  HPI: Tanner [name] was admitted yesterday with PNA. He was noted to also have RLE cellulitis and a knee effusion and orthopedic surgery was consulted. He initially denied knee pain at all but when questioned did admit to some discomfort in the knee that's been present since TKA in July.   Past Medical History:  Diagnosis Date   Acute kidney failure (HCC)    Anxiety    Arthritis    Back pain    BPH (benign prostatic hypertrophy)    CHF (congestive heart failure) (HCC)    Clostridium difficile infection    Clotting disorder (HCC)    Depression    Diabetes (HCC) 12/11/2016   type 2    DVT (deep venous thrombosis) (HCC)    DVT of deep femoral vein, right (HCC) 06/29/2018   Edema, lower extremity    High cholesterol    Hypertension    Joint pain    Low back pain potentially associated with spinal stenosis    Neuromuscular disorder (HCC)    Neuropathy of lower extremity    bilateral   OSA (obstructive sleep apnea)    pt denies    Osteoarthritis    PE (pulmonary embolism)    Pneumonia    hx of x 2    Ventral hernia     Past Surgical History:  Procedure Laterality Date   CARDIOVERSION N/A 08/19/2022   Procedure: CARDIOVERSION;  Surgeon: Meriam Sprague, MD;  Location: [facility] ENDOSCOPY;  Service: Cardiovascular;  Laterality: N/A;   EYE SURGERY     HERNIA REPAIR  1999   TOTAL KNEE ARTHROPLASTY Left 08/26/2020   Procedure: LEFT TOTAL KNEE ARTHROPLASTY;  Surgeon: Gean Birchwood, MD;  Location: [facility] ORS;  Service: Orthopedics;  Laterality: Left;   TOTAL KNEE ARTHROPLASTY Right 12/15/2022   Procedure: TOTAL KNEE ARTHROPLASTY;  Surgeon: Durene Romans, MD;  Location: [facility] ORS;  Service: Orthopedics;  Laterality: Right;    Family History  Problem Relation Age of Onset   Diabetes Mother    Pneumonia Mother    Alzheimer's disease Mother     Hyperlipidemia Mother    Thyroid disease Mother    Depression Mother    Other Father 47       Drowned on boating accident   Colon cancer Neg Hx    Colon polyps Neg Hx     Social History:  reports that he quit smoking about 21 years ago. His smoking use included cigarettes. He started smoking about 54 years ago. He has a 66.1 pack-year smoking history. He has never used smokeless tobacco. He reports current alcohol use. He reports that he does not use drugs.  Allergies:  Allergies  Allergen Reactions   Lisinopril Other (See Comments)     Renal impairment   Amlodipine Swelling   Codeine Sulfate Itching and Nausea Only    Medications: I have reviewed the patient's current medications.  Results for orders placed or performed during the hospital encounter of 03/18/23 (from the past 48 hour(s))  CBC with Differential     Status: Abnormal   Collection Time: 03/18/23  2:07 AM  Result Value Ref Range   WBC 13.6 (H) 4.0 - 10.5 K/uL   RBC 5.33 4.22 - 5.81 MIL/uL   Hemoglobin 13.0 13.0 - 17.0 g/dL   HCT 34.7 42.5 - 95.6 %   MCV 80.3 80.0 -  100.0 fL   MCH 24.4 (L) 26.0 - 34.0 pg   MCHC 30.4 30.0 - 36.0 g/dL   RDW 74.2 (H) 59.5 - 63.8 %   Platelets 197 150 - 400 K/uL   nRBC 0.0 0.0 - 0.2 %   Neutrophils Relative % 80 %   Neutro Abs 10.8 (H) 1.7 - 7.7 K/uL   Lymphocytes Relative 8 %   Lymphs Abs 1.1 0.7 - 4.0 K/uL   Monocytes Relative 11 %   Monocytes Absolute 1.5 (H) 0.1 - 1.0 K/uL   Eosinophils Relative 0 %   Eosinophils Absolute 0.0 0.0 - 0.5 K/uL   Basophils Relative 0 %   Basophils Absolute 0.1 0.0 - 0.1 K/uL   Immature Granulocytes 1 %   Abs Immature Granulocytes 0.08 (H) 0.00 - 0.07 K/uL    Comment: Performed at [facility], [address] [place], [place] [address]  Comprehensive metabolic panel     Status: Abnormal   Collection Time: 03/18/23  2:07 AM  Result Value Ref Range   Sodium 137 135 - 145 mmol/L   Potassium 4.1 3.5 - 5.1 mmol/L   Chloride 101 98 - 111  mmol/L   CO2 23 22 - 32 mmol/L   Glucose, Bld 80 70 - 99 mg/dL    Comment: Glucose reference range applies only to samples taken after fasting for at least 8 hours.   BUN 18 8 - 23 mg/dL   Creatinine, Ser 3.29 (H) 0.61 - 1.24 mg/dL   Calcium 9.1 8.9 - 51.8 mg/dL   Total Protein 6.8 6.5 - 8.1 g/dL   Albumin 3.4 (L) 3.5 - 5.0 g/dL   AST 15 15 - 41 U/L   ALT 16 0 - 44 U/L   Alkaline Phosphatase 85 38 - 126 U/L   Total Bilirubin 1.2 0.3 - 1.2 mg/dL   GFR, Estimated 43 (L) >60 mL/min    Comment: (NOTE) Calculated using the CKD-EPI Creatinine Equation (2021)    Anion gap 13 5 - 15    Comment: Performed at [facility], [address] [place], [place] [address]  Troponin I (High Sensitivity)     Status: None   Collection Time: 03/18/23  2:07 AM  Result Value Ref Range   Troponin I (High Sensitivity) 14 <18 ng/L    Comment: (NOTE) Elevated high sensitivity troponin I (hsTnI) values and significant  changes across serial measurements may suggest ACS but many other  chronic and acute conditions are known to elevate hsTnI results.  Refer to the "Links" section for chest pain algorithms and additional  guidance. Performed at [facility], [address], [place], [place] [address]   Brain natriuretic peptide     Status: Abnormal   Collection Time: 03/18/23  2:07 AM  Result Value Ref Range   B Natriuretic Peptide 157.3 (H) 0.0 - 100.0 pg/mL    Comment: Performed at [facility], [address] [place], [place] [address]  TSH     Status: Abnormal   Collection Time: 03/18/23  2:07 AM  Result Value Ref Range   TSH 8.665 (H) 0.350 - 4.500 uIU/mL    Comment: Performed by a 3rd Generation assay with a functional sensitivity of <=0.01 uIU/mL. Performed at [facility], [address] [place], [place] [address]   T4, free     Status: None   Collection Time: 03/18/23  2:07 AM  Result Value Ref Range   Free T4 0.65  0.61 - 1.12 ng/dL    Comment: (NOTE) Biotin  ingestion may interfere with free T4 tests. If the results are inconsistent with the TSH level, previous test results, or the clinical presentation, then consider biotin interference. If needed, order repeat testing after stopping biotin. Performed at [facility], [address], [place], [place] [address]   I-Stat venous blood gas, [facility])     Status: Abnormal   Collection Time: 03/18/23  2:11 AM  Result Value Ref Range   pH, Ven 7.374 7.25 - 7.43   pCO2, Ven 44.6 44 - 60 mmHg   pO2, Ven 21 (LL) 32 - 45 mmHg   Bicarbonate 26.0 20.0 - 28.0 mmol/L   TCO2 27 22 - 32 mmol/L   O2 Saturation 33 %   Acid-Base Excess 0.0 0.0 - 2.0 mmol/L   Sodium 139 135 - 145 mmol/L   Potassium 4.1 3.5 - 5.1 mmol/L   Calcium, Ion 1.11 (L) 1.15 - 1.40 mmol/L   HCT 45.0 39.0 - 52.0 %   Hemoglobin 15.3 13.0 - 17.0 g/dL   Sample type VENOUS    Comment NOTIFIED PHYSICIAN   Blood Culture (routine x 2)     Status: None (Preliminary result)   Collection Time: 03/18/23  3:22 AM   Specimen: BLOOD RIGHT HAND  Result Value Ref Range   Specimen Description BLOOD RIGHT HAND    Special Requests      BOTTLES DRAWN AEROBIC AND ANAEROBIC Blood Culture results may not be optimal due to an inadequate volume of blood received in culture bottles   Culture      NO GROWTH < 12 HOURS Performed at [facility], [address] [place], [place] [address]    Report Status PENDING   Troponin I (High Sensitivity)     Status: None   Collection Time: 03/18/23  3:23 AM  Result Value Ref Range   Troponin I (High Sensitivity) 17 <18 ng/L    Comment: (NOTE) Elevated high sensitivity troponin I (hsTnI) values and significant  changes across serial measurements may suggest ACS but many other  chronic and acute conditions are known to elevate hsTnI results.  Refer to the "Links" section for chest pain algorithms and additional  guidance. Performed at [facility], [address], [place],  [place] [address]   Protime-INR     Status: Abnormal   Collection Time: 03/18/23  3:25 AM  Result Value Ref Range   Prothrombin Time 18.4 (H) 11.4 - 15.2 seconds   INR 1.5 (H) 0.8 - 1.2    Comment: (NOTE) INR goal varies based on device and disease states. Performed at [facility], [address], [place], [place] [address]   I-Stat Lactic Acid, ED     Status: None   Collection Time: 03/18/23  3:27 AM  Result Value Ref Range   Lactic Acid, Venous 1.6 0.5 - 1.9 mmol/L  Resp panel by RT-PCR (RSV, Flu A&B, Covid) Anterior Nasal Swab     Status: None   Collection Time: 03/18/23  3:32 AM   Specimen: Anterior Nasal Swab  Result Value Ref Range   SARS Coronavirus 2 by RT PCR NEGATIVE NEGATIVE   Influenza A by PCR NEGATIVE NEGATIVE   Influenza B by PCR NEGATIVE NEGATIVE    Comment: (NOTE) The Xpert Xpress SARS-CoV-2/FLU/RSV plus assay is intended as an aid in the diagnosis of influenza from Nasopharyngeal swab specimens and should not be used as a  sole basis for treatment. Nasal washings and aspirates are unacceptable for Xpert Xpress SARS-CoV-2/FLU/RSV testing.  Fact Sheet for Patients: BloggerCourse.com  Fact Sheet for Healthcare Providers: SeriousBroker.it  This test is not yet approved or cleared by the [place] FDA and has been authorized for detection and/or diagnosis of SARS-CoV-2 by FDA under an Emergency Use Authorization (EUA). This EUA will remain in effect (meaning this test can be used) for the duration of the COVID-19 declaration under Section 564(b)(1) of the Act, 21 U.S.C. section 360bbb-3(b)(1), unless the authorization is terminated or revoked.     Resp Syncytial Virus by PCR NEGATIVE NEGATIVE    Comment: (NOTE) Fact Sheet for Patients: BloggerCourse.com  Fact Sheet for Healthcare Providers: SeriousBroker.it  This test is not yet approved or cleared by  the [place] FDA and has been authorized for detection and/or diagnosis of SARS-CoV-2 by FDA under an Emergency Use Authorization (EUA). This EUA will remain in effect (meaning this test can be used) for the duration of the COVID-19 declaration under Section 564(b)(1) of the Act, 21 U.S.C. section 360bbb-3(b)(1), unless the authorization is terminated or revoked.  Performed at [facility], [address] [place], [place] [address]   Glucose, capillary     Status: Abnormal   Collection Time: 03/18/23  6:22 AM  Result Value Ref Range   Glucose-Capillary 69 (L) 70 - 99 mg/dL    Comment: Glucose reference range applies only to samples taken after fasting for at least 8 hours.  Glucose, capillary     Status: Abnormal   Collection Time: 03/18/23  6:39 AM  Result Value Ref Range   Glucose-Capillary 68 (L) 70 - 99 mg/dL    Comment: Glucose reference range applies only to samples taken after fasting for at least 8 hours.  Glucose, capillary     Status: Abnormal   Collection Time: 03/18/23  7:23 AM  Result Value Ref Range   Glucose-Capillary 101 (H) 70 - 99 mg/dL    Comment: Glucose reference range applies only to samples taken after fasting for at least 8 hours.  Blood Culture (routine x 2)     Status: None (Preliminary result)   Collection Time: 03/18/23  8:42 AM   Specimen: BLOOD LEFT HAND  Result Value Ref Range   Specimen Description BLOOD LEFT HAND    Special Requests      BOTTLES DRAWN AEROBIC AND ANAEROBIC Blood Culture results may not be optimal due to an inadequate volume of blood received in culture bottles   Culture      NO GROWTH <12 HOURS Performed at [facility], [address], [place], [place] [address]    Report Status PENDING   Renal function panel     Status: Abnormal   Collection Time: 03/18/23  8:42 AM  Result Value Ref Range   Sodium 136 135 - 145 mmol/L   Potassium 4.1 3.5 - 5.1 mmol/L   Chloride 101 98 - 111 mmol/L   CO2 21 (L) 22 - 32  mmol/L   Glucose, Bld 124 (H) 70 - 99 mg/dL    Comment: Glucose reference range applies only to samples taken after fasting for at least 8 hours.   BUN 18 8 - 23 mg/dL   Creatinine, Ser 2.53 (H) 0.61 - 1.24 mg/dL   Calcium 8.5 (L) 8.9 - 10.3 mg/dL   Phosphorus 3.0 2.5 - 4.6 mg/dL   Albumin 2.6 (L) 3.5 - 5.0 g/dL   GFR, Estimated 44 (L) >60  mL/min    Comment: (NOTE) Calculated using the CKD-EPI Creatinine Equation (2021)    Anion gap 14 5 - 15    Comment: Performed at [facility], [address], [place], [place] [address]  Magnesium     Status: None   Collection Time: 03/18/23  8:42 AM  Result Value Ref Range   Magnesium 1.9 1.7 - 2.4 mg/dL    Comment: Performed at [facility], [address], [place], [place] [address]  CBC     Status: Abnormal   Collection Time: 03/18/23  8:42 AM  Result Value Ref Range   WBC 10.7 (H) 4.0 - 10.5 K/uL   RBC 4.64 4.22 - 5.81 MIL/uL   Hemoglobin 11.1 (L) 13.0 - 17.0 g/dL   HCT 09.8 (L) 11.9 - 14.7 %   MCV 80.0 80.0 - 100.0 fL   MCH 23.9 (L) 26.0 - 34.0 pg   MCHC 29.9 (L) 30.0 - 36.0 g/dL   RDW 82.9 (H) 56.2 - 13.0 %   Platelets 161 150 - 400 K/uL   nRBC 0.0 0.0 - 0.2 %    Comment: Performed at [facility], [address] [place], [place] [address]  Procalcitonin     Status: None   Collection Time: 03/18/23  8:42 AM  Result Value Ref Range   Procalcitonin 0.43 ng/mL    Comment:        Interpretation: PCT (Procalcitonin) <= 0.5 ng/mL: Systemic infection (sepsis) is not likely. Local bacterial infection is possible. (NOTE)       Sepsis PCT Algorithm           Lower Respiratory Tract                                      Infection PCT Algorithm    ----------------------------     ----------------------------         PCT < 0.25 ng/mL                PCT < 0.10 ng/mL          Strongly encourage             Strongly discourage   discontinuation of antibiotics    initiation of antibiotics    ----------------------------      -----------------------------       PCT 0.25 - 0.50 ng/mL            PCT 0.10 - 0.25 ng/mL               OR       >80% decrease in PCT            Discourage initiation of                                            antibiotics      Encourage discontinuation           of antibiotics    ----------------------------     -----------------------------         PCT >= 0.50 ng/mL              PCT 0.26 - 0.50 ng/mL               AND        <  80% decrease in PCT             Encourage initiation of                                             antibiotics       Encourage continuation           of antibiotics    ----------------------------     -----------------------------        PCT >= 0.50 ng/mL                  PCT > 0.50 ng/mL               AND         increase in PCT                  Strongly encourage                                      initiation of antibiotics    Strongly encourage escalation           of antibiotics                                     -----------------------------                                           PCT <= 0.25 ng/mL                                                 OR                                        > 80% decrease in PCT                                      Discontinue / Do not initiate                                             antibiotics  Performed at [facility], [address], [place], [place] [address]   Glucose, capillary     Status: None   Collection Time: 03/18/23 12:14 PM  Result Value Ref Range   Glucose-Capillary 92 70 - 99 mg/dL    Comment: Glucose reference range applies only to samples taken after fasting for at least 8 hours.    VAS [name] LOWER EXTREMITY VENOUS (DVT)  Result Date: 03/18/2023  Lower Venous DVT Study Patient Name:  Tanner [name]  Date of Exam:   03/18/2023 Medical Rec #: [id number]       Accession #:  [id number] Date of Birth: 1946/04/15       Patient Gender: M Patient Age:   47 years Exam Location:  [facility] Procedure:      VAS [name] LOWER EXTREMITY VENOUS (DVT) Referring Phys: Enid Derry HALL --------------------------------------------------------------------------------  Indications: Edema. Status post fall on right knee, knee effusion by X-ray. Right TKR 12/15/22  Comparison Study: Prior negative Right LEV done 01/07/22 Performing Technologist: Sherren Kerns RVS  Examination Guidelines: A complete evaluation includes B-mode imaging, spectral Doppler, color Doppler, and power Doppler as needed of all accessible portions of each vessel. Bilateral testing is considered an integral part of a complete examination. Limited examinations for reoccurring indications may be performed as noted. The reflux portion of the exam is performed with the patient in reverse Trendelenburg.  +--------+---------------+---------+-----------+----------------+-------------+ RIGHT   CompressibilityPhasicitySpontaneityProperties      Thrombus                                                                 Aging         +--------+---------------+---------+-----------+----------------+-------------+ CFV     Full                               pulsatile                                                                waveforms                     +--------+---------------+---------+-----------+----------------+-------------+ SFJ     Full                                                             +--------+---------------+---------+-----------+----------------+-------------+ FV Prox Full                               pulsatile                                                                waveforms                     +--------+---------------+---------+-----------+----------------+-------------+ FV Mid  Full                               pulsatile  waveforms                      +--------+---------------+---------+-----------+----------------+-------------+ FV      Full                                                             Distal                                                                   +--------+---------------+---------+-----------+----------------+-------------+ PFV     Full                                                             +--------+---------------+---------+-----------+----------------+-------------+ POP     Full                               pulsatile                                                                waveforms                     +--------+---------------+---------+-----------+----------------+-------------+ PTV     Full                                                             +--------+---------------+---------+-----------+----------------+-------------+ PERO    Full                                                             +--------+---------------+---------+-----------+----------------+-------------+   +----+---------------+---------+-----------+-------------------+--------------+ LEFTCompressibilityPhasicitySpontaneityProperties         Thrombus Aging +----+---------------+---------+-----------+-------------------+--------------+ CFV Full                               pulsatile waveforms               +----+---------------+---------+-----------+-------------------+--------------+     Summary: RIGHT: - There is no evidence of deep vein thrombosis in the lower extremity.  - Fluid noted in popliteal fossa and medial knee - Ultrasound characteristics of enlarged lymph nodes are noted in the groin. pulsatile waveforms - Possible obstruction proximal to the inguinal ligament.  LEFT: - No evidence  of common femoral vein obstruction.   *See table(s) above for measurements and observations.    Preliminary    CT Head Wo Contrast  Result Date: 03/18/2023 CLINICAL DATA:  Altered mental  status EXAM: CT HEAD WITHOUT CONTRAST TECHNIQUE: Contiguous axial images were obtained from the base of the skull through the vertex without intravenous contrast. RADIATION DOSE REDUCTION: This exam was performed according to the departmental dose-optimization program which includes automated exposure control, adjustment of the mA and/or kV according to patient size and/or use of iterative reconstruction technique. COMPARISON:  05/04/2022 FINDINGS: Brain: There is atrophy and chronic small vessel disease changes. No acute intracranial abnormality. Specifically, no hemorrhage, hydrocephalus, mass lesion, acute infarction, or significant intracranial injury. Vascular: No hyperdense vessel or unexpected calcification. Skull: No acute calvarial abnormality. Sinuses/Orbits: No acute findings Other: None IMPRESSION: Atrophy, chronic microvascular disease. No acute intracranial abnormality. Electronically Signed   By: Charlett Nose M.D.   On: 03/18/2023 01:57   DG Chest Portable 1 View  Result Date: 03/18/2023 CLINICAL DATA:  Knee pain.  Altered her mental status. EXAM: PORTABLE CHEST 1 VIEW COMPARISON:  08/18/2022 FINDINGS: Heart and mediastinal contours are within normal limits. Bibasilar airspace opacities, right greater than left, no effusions. No acute bony abnormality. IMPRESSION: Bibasilar airspace opacities, right greater than left. This could reflect atelectasis or pneumonia. Electronically Signed   By: Charlett Nose M.D.   On: 03/18/2023 01:42   DG Knee Complete 4 Views Right  Result Date: 03/18/2023 CLINICAL DATA:  Knee pain EXAM: RIGHT KNEE - COMPLETE 4+ VIEW COMPARISON:  None Available. FINDINGS: Prior right knee replacement. No hardware complicating feature. Large joint effusion. Anterior soft tissue swelling. No acute bony abnormality. Specifically, no fracture, subluxation, or dislocation. IMPRESSION: Right knee replacement. Large joint effusion. No acute bony abnormality. Electronically Signed   By:  Charlett Nose M.D.   On: 03/18/2023 01:41    Review of Systems  Constitutional:  Negative for chills, diaphoresis and fever.  HENT:  Negative for ear discharge, ear pain, hearing loss and tinnitus.   Eyes:  Negative for photophobia and pain.  Respiratory:  Negative for cough and shortness of breath.   Cardiovascular:  Negative for chest pain.  Gastrointestinal:  Negative for abdominal pain, nausea and vomiting.  Genitourinary:  Negative for dysuria, flank pain, frequency and urgency.  Musculoskeletal:  Negative for arthralgias, back pain, myalgias and neck pain.  Neurological:  Negative for dizziness and headaches.  Hematological:  Does not bruise/bleed easily.  Psychiatric/Behavioral:  The patient is not nervous/anxious.    Blood pressure 125/71, pulse 81, temperature 98 F (36.7 C), temperature source Oral, resp. rate 18, height 5' 5.5" (1.664 m), weight 105.2 kg, SpO2 98%. Physical Exam Constitutional:      General: He is not in acute distress.    Appearance: He is well-developed. He is not diaphoretic.  HENT:     Head: Normocephalic and atraumatic.  Eyes:     General: No scleral icterus.       Right eye: No discharge.        Left eye: No discharge.     Conjunctiva/sclera: Conjunctivae normal.  Cardiovascular:     Rate and Rhythm: Normal rate and regular rhythm.  Pulmonary:     Effort: Pulmonary effort is normal. No respiratory distress.  Musculoskeletal:     Cervical back: Normal range of motion.     Comments: RLE No traumatic wounds or ecchymosis. Lower leg cellulitis noted.  Minimal TTP, AROM of knee >90 degrees  without pain  Mild knee effusion  Knee [name] to varus/ valgus and anterior/posterior stress  Sens DPN, SPN, TN intact  Motor EHL, ext, flex, evers 5/5  DP 2+, PT 2+, No significant edema  Skin:    General: Skin is warm and dry.  Neurological:     Mental Status: He is alert.  Psychiatric:        Mood and Affect: Mood normal.        Behavior: Behavior  normal.     Assessment/Plan: Right knee effusion -- Given excellent ROM and relatively small effusion do not think any benefit in aspiration. He should f/u with Dr. Charlann Boxer at discharge.    Freeman Caldron, PA-C Orthopedic Surgery [phone] 03/18/2023, 2:29 PM

## 2023-03-18 NOTE — ED Notes (Signed)
Report given to Tiffany, RN. 

## 2023-03-18 NOTE — Progress Notes (Addendum)
PROGRESS NOTE  Tanner [name] GEX:[id number] DOB: 1946-03-27   PCP: Myrlene Broker, MD  Patient is from: Home.  Uses rolling walker at baseline.  DOA: 03/18/2023 LOS: 0  Chief complaints Chief Complaint  Patient presents with   Altered Mental Status     Brief Narrative / Interim history: 77 year old M with PMH of PE/PAF on Eliquis, HTN, HLD, HFmrEF, CKD-3A, OSA and recent right TKA by Dr. Charlann Boxer presented to ED with altered mental status, productive cough, myalgia and hypoxemia to 80s per EMS, and admitted for acute respiratory failure with hypoxia due to pneumonia.  CXR showed bibasilar opacities, right > left.  He also had significant right knee effusion with RLE swelling and erythema.  He was hemodynamically stable.  Lower extremity venous Doppler ordered.  Started on ceftriaxone and Zithromax, and admitted.  Recent Labs    06/17/22 1054 08/18/22 1033 08/19/22 0104 08/20/22 0045 09/01/22 1126 10/01/22 1606 12/02/22 0902 12/16/22 0335 03/18/23 0207 03/18/23 0842  BUN 13 23 20  25* 22 21 19 22 18 18   CREATININE 1.16 1.45* 1.27* 1.81* 1.76* 1.81* 1.87* 1.56* 1.62* 1.61*    Subjective: Seen and examined earlier this morning.  No major events overnight of this morning.  No specific complaints but somewhat confused about time of the day.  He reports some cough but no chest pain or shortness of breath.  Denies GI or UTI symptoms.  Objective: Vitals:   03/18/23 0530 03/18/23 0551 03/18/23 0618 03/18/23 0900  BP: (!) 161/60  134/74 125/71  Pulse: 77  81   Resp: (!) 22  18   Temp:  99.9 F (37.7 C) 98.4 F (36.9 C) 98 F (36.7 C)  TempSrc:  Axillary  Oral  SpO2: 99%  97% 98%  Weight:      Height:        Examination:  GENERAL: No apparent distress.  Nontoxic. HEENT: MMM.  Vision and hearing grossly intact.  NECK: Supple.  No apparent JVD.  RESP:  No IWOB.  Fair aeration bilaterally but limited exam due to body habitus. CVS:  RRR. Heart sounds normal.   ABD/GI/GU: BS+. Abd soft, NTND.  MSK/EXT: Significant RLE swelling with knee effusion.  Some ulceration over his knee.  No apparent drainage.  Erythema below his right knee.  Increased warmth to touch. SKIN: As above NEURO: Awake, alert and oriented appropriately.  No apparent focal neuro deficit. PSYCH: Calm. Normal affect.   Procedures:  None  Microbiology summarized: COVID-19, influenza and RSV PCR nonreactive Blood cultures NGTD  Assessment and plan: Principal Problem:   Aspiration pneumonia (HCC) Active Problems:   Essential hypertension   Chronic kidney disease (CKD), stage III (moderate) (HCC)   History of pulmonary embolus (PE)   OSA (obstructive sleep apnea)   Morbid obesity (HCC)   Chronic systolic heart failure (HCC)   Paroxysmal atrial fibrillation (HCC)   S/P total knee arthroplasty, right   Pressure injury of skin   Severe sepsis (HCC)  Severe sepsis due to bibasilar aspiration pneumonia and RLE cellulitis: POA.  Had leukocytosis, tachypnea, respiratory failure and mental status change on presentation.  Concern for aspiration pneumonia versus CAP given mental status change and risk for aspiration.  He also have RLE cellulitis.  Sepsis physiology, respiratory status and mental status improving.  Blood cultures NGTD -Continue IV ceftriaxone.  Given improvement, I would not add anaerobic coverage -Continue Zithromax for 3 days although low suspicion for typical infection -Decrease IV fluid -Aspiration precaution and SLP eval -Pulmonary toilet -  PT/OT eval   Acute hypoxic respiratory failure secondary to the above: Improved.  Currently saturating in upper 90s on 2 L. -Wean oxygen as able -Further management as above  Generalized weakness with fall: Reportedly fell 1 week ago.  No apparent trauma but significant right knee effusion. RLE edema/cellulitis post fall: Right knee x-ray positive for large effusion, also edema from knee down History of right TKA in 12/2022  by Dr. Charlann Boxer Large right knee effusion -Orthopedic surgery consulted -Follow right lower extremity venous Doppler.  Already on Eliquis. -Antibiotics as above -Fall precaution -PT/OT   Acute metabolic encephalopathy: Multifactorial including severe sepsis, respiratory failure and polypharmacy.  On multiple sedating medications.  Encephalopathy seems to have improved -Treat treatable causes -Reorientation and delirium precaution -Minimize or avoid sedating medications  Chronic HFmrEF: TTE in 08/2022 with LVEF of 45 to 50%, GH, indeterminate DD, and normal RV SF.  Appears euvolemic except for right lower extremity swelling.  -Discontinue IV fluid -Continue holding diuretics -Strict intake and output, daily weights, renal functions and electrolytes   DM-2 with hyperglycemia and hypoglycemia A1c 4.8% in 11/2022.  On Jardiance and Ozempic at home. Recent Labs  Lab 03/18/23 0622 03/18/23 0639 03/18/23 0723  GLUCAP 69* 68* 101*  -Discontinue SSI and CBG monitoring -Liberate diet -Recheck hemoglobin A1c  Paroxysmal A-fib: Rate controlled. -Continue home Coreg at reduced dose -Resume home amiodarone -Continue Eliquis for anticoagulation -Optimize electrolytes  OSA  -Order CPAP nightly  CKD-3A: Stable -Continue monitoring   Hypertension: Normotensive -Continue home Coreg at reduced dose  Morbid obesity: Elevated BMI with comorbidity as above Body mass index is 38.01 kg/m. -Already on Ozempic.  Pressure skin injury: POA Pressure Injury 03/18/23 Buttocks Left;Right Stage 2 -  Partial thickness loss of dermis presenting as a shallow open injury with a red, pink wound bed without slough. (Active)  03/18/23 0629  Location: Buttocks  Location Orientation: Left;Right  Staging: Stage 2 -  Partial thickness loss of dermis presenting as a shallow open injury with a red, pink wound bed without slough.  Wound Description (Comments):   Present on Admission: Yes  Dressing Type Foam -  Lift dressing to assess site every shift 03/18/23 0623   DVT prophylaxis:   apixaban (ELIQUIS) tablet 5 mg  Code Status: Full code Family Communication: None at baseline Level of care: Telemetry Medical Status is: Inpatient Remains inpatient appropriate because: Severe sepsis due to aspiration pneumonia and right lower extremity cellulitis   Final disposition: TBD Consultants:  Orthopedic surgery  55 minutes with more than 50% spent in reviewing records, counseling patient/family and coordinating care.   Sch Meds:  Scheduled Meds:  amiodarone  200 mg Oral Daily   apixaban  5 mg Oral BID   carvedilol  3.125 mg Oral BID WC   Continuous Infusions:  azithromycin     cefTRIAXone (ROCEPHIN)  IV 2 g (03/18/23 0910)   PRN Meds:.acetaminophen, guaiFENesin, melatonin, polyethylene glycol, prochlorperazine  Antimicrobials: Anti-infectives (From admission, onward)    Start     Dose/Rate Route Frequency Ordered Stop   03/18/23 2200  azithromycin (ZITHROMAX) 500 mg in sodium chloride 0.9 % 250 mL IVPB        500 mg 250 mL/hr over 60 Minutes Intravenous Every 24 hours 03/18/23 0543 03/22/23 2159   03/18/23 1000  cefTRIAXone (ROCEPHIN) 2 g in sodium chloride 0.9 % 100 mL IVPB        2 g 200 mL/hr over 30 Minutes Intravenous Every 24 hours 03/18/23 0543 03/22/23 0959  03/18/23 0230  cefTRIAXone (ROCEPHIN) 1 g in sodium chloride 0.9 % 100 mL IVPB        1 g 200 mL/hr over 30 Minutes Intravenous  Once 03/18/23 0228 03/18/23 0327   03/18/23 0230  azithromycin (ZITHROMAX) 500 mg in sodium chloride 0.9 % 250 mL IVPB        500 mg 250 mL/hr over 60 Minutes Intravenous  Once 03/18/23 0228 03/18/23 0402        I have personally reviewed the following labs and images: CBC: Recent Labs  Lab 03/18/23 0207 03/18/23 0211 03/18/23 0842  WBC 13.6*  --  10.7*  NEUTROABS 10.8*  --   --   HGB 13.0 15.3 11.1*  HCT 42.8 45.0 37.1*  MCV 80.3  --  80.0  PLT 197  --  161   BMP &GFR Recent  Labs  Lab 03/18/23 0207 03/18/23 0211 03/18/23 0842  NA 137 139 136  K 4.1 4.1 4.1  CL 101  --  101  CO2 23  --  21*  GLUCOSE 80  --  124*  BUN 18  --  18  CREATININE 1.62*  --  1.61*  CALCIUM 9.1  --  8.5*  MG  --   --  1.9  PHOS  --   --  3.0   Estimated Creatinine Clearance: 42.6 mL/min (A) (by C-G formula based on SCr of 1.61 mg/dL (H)). Liver & Pancreas: Recent Labs  Lab 03/18/23 0207 03/18/23 0842  AST 15  --   ALT 16  --   ALKPHOS 85  --   BILITOT 1.2  --   PROT 6.8  --   ALBUMIN 3.4* 2.6*   No results for input(s): "LIPASE", "AMYLASE" in the last 168 hours. No results for input(s): "AMMONIA" in the last 168 hours. Diabetic: No results for input(s): "HGBA1C" in the last 72 hours. Recent Labs  Lab 03/18/23 0622 03/18/23 0639 03/18/23 0723  GLUCAP 69* 68* 101*   Cardiac Enzymes: No results for input(s): "CKTOTAL", "CKMB", "CKMBINDEX", "TROPONINI" in the last 168 hours. No results for input(s): "PROBNP" in the last 8760 hours. Coagulation Profile: Recent Labs  Lab 03/18/23 0325  INR 1.5*   Thyroid Function Tests: Recent Labs    03/18/23 0207  TSH 8.665*  FREET4 0.65   Lipid Profile: No results for input(s): "CHOL", "HDL", "LDLCALC", "TRIG", "CHOLHDL", "LDLDIRECT" in the last 72 hours. Anemia Panel: No results for input(s): "VITAMINB12", "FOLATE", "FERRITIN", "TIBC", "IRON", "RETICCTPCT" in the last 72 hours. Urine analysis:    Component Value Date/Time   COLORURINE YELLOW 05/04/2022 1740   APPEARANCEUR CLOUDY (A) 05/04/2022 1740   LABSPEC 1.024 05/04/2022 1740   PHURINE 5.0 05/04/2022 1740   GLUCOSEU NEGATIVE 05/04/2022 1740   GLUCOSEU NEGATIVE 09/13/2007 1028   HGBUR SMALL (A) 05/04/2022 1740   BILIRUBINUR NEGATIVE 05/04/2022 1740   BILIRUBINUR negative 04/19/2015 1417   BILIRUBINUR neg 02/12/2013 1449   KETONESUR 20 (A) 05/04/2022 1740   PROTEINUR >=300 (A) 05/04/2022 1740   UROBILINOGEN 0.2 04/19/2015 1417   UROBILINOGEN 0.2 mg/dL  47/82/9562 1308   NITRITE NEGATIVE 05/04/2022 1740   LEUKOCYTESUR NEGATIVE 05/04/2022 1740   Sepsis Labs: Invalid input(s): "PROCALCITONIN", "LACTICIDVEN"  Microbiology: Recent Results (from the past 240 hour(s))  Blood Culture (routine x 2)     Status: None (Preliminary result)   Collection Time: 03/18/23  3:22 AM   Specimen: BLOOD RIGHT HAND  Result Value Ref Range Status   Specimen Description BLOOD RIGHT HAND  Final  Special Requests   Final    BOTTLES DRAWN AEROBIC AND ANAEROBIC Blood Culture results may not be optimal due to an inadequate volume of blood received in culture bottles   Culture   Final    NO GROWTH < 12 HOURS Performed at [facility], [address], [place], [place] [address]    Report Status PENDING  Incomplete  Resp panel by RT-PCR (RSV, Flu A&B, Covid) Anterior Nasal Swab     Status: None   Collection Time: 03/18/23  3:32 AM   Specimen: Anterior Nasal Swab  Result Value Ref Range Status   SARS Coronavirus 2 by RT PCR NEGATIVE NEGATIVE Final   Influenza A by PCR NEGATIVE NEGATIVE Final   Influenza B by PCR NEGATIVE NEGATIVE Final    Comment: (NOTE) The Xpert Xpress SARS-CoV-2/FLU/RSV plus assay is intended as an aid in the diagnosis of influenza from Nasopharyngeal swab specimens and should not be used as a sole basis for treatment. Nasal washings and aspirates are unacceptable for Xpert Xpress SARS-CoV-2/FLU/RSV testing.  Fact Sheet for Patients: BloggerCourse.com  Fact Sheet for Healthcare Providers: SeriousBroker.it  This test is not yet approved or cleared by the [place] FDA and has been authorized for detection and/or diagnosis of SARS-CoV-2 by FDA under an Emergency Use Authorization (EUA). This EUA will remain in effect (meaning this test can be used) for the duration of the COVID-19 declaration under Section 564(b)(1) of the Act, 21 U.S.C. section 360bbb-3(b)(1), unless the  authorization is terminated or revoked.     Resp Syncytial Virus by PCR NEGATIVE NEGATIVE Final    Comment: (NOTE) Fact Sheet for Patients: BloggerCourse.com  Fact Sheet for Healthcare Providers: SeriousBroker.it  This test is not yet approved or cleared by the [place] FDA and has been authorized for detection and/or diagnosis of SARS-CoV-2 by FDA under an Emergency Use Authorization (EUA). This EUA will remain in effect (meaning this test can be used) for the duration of the COVID-19 declaration under Section 564(b)(1) of the Act, 21 U.S.C. section 360bbb-3(b)(1), unless the authorization is terminated or revoked.  Performed at [facility], [address], [place], [place] [address]   Blood Culture (routine x 2)     Status: None (Preliminary result)   Collection Time: 03/18/23  8:42 AM   Specimen: BLOOD LEFT HAND  Result Value Ref Range Status   Specimen Description BLOOD LEFT HAND  Final   Special Requests   Final    BOTTLES DRAWN AEROBIC AND ANAEROBIC Blood Culture results may not be optimal due to an inadequate volume of blood received in culture bottles   Culture   Final    NO GROWTH <12 HOURS Performed at [facility], [address], [place], [place] [address]    Report Status PENDING  Incomplete    Radiology Studies: CT Head Wo Contrast  Result Date: 03/18/2023 CLINICAL DATA:  Altered mental status EXAM: CT HEAD WITHOUT CONTRAST TECHNIQUE: Contiguous axial images were obtained from the base of the skull through the vertex without intravenous contrast. RADIATION DOSE REDUCTION: This exam was performed according to the departmental dose-optimization program which includes automated exposure control, adjustment of the mA and/or kV according to patient size and/or use of iterative reconstruction technique. COMPARISON:  05/04/2022 FINDINGS: Brain: There is atrophy and chronic small vessel disease changes.  No acute intracranial abnormality. Specifically, no hemorrhage, hydrocephalus, mass lesion, acute infarction, or significant intracranial injury. Vascular: No hyperdense vessel or unexpected calcification. Skull: No  acute calvarial abnormality. Sinuses/Orbits: No acute findings Other: None IMPRESSION: Atrophy, chronic microvascular disease. No acute intracranial abnormality. Electronically Signed   By: Charlett Nose M.D.   On: 03/18/2023 01:57   DG Chest Portable 1 View  Result Date: 03/18/2023 CLINICAL DATA:  Knee pain.  Altered her mental status. EXAM: PORTABLE CHEST 1 VIEW COMPARISON:  08/18/2022 FINDINGS: Heart and mediastinal contours are within normal limits. Bibasilar airspace opacities, right greater than left, no effusions. No acute bony abnormality. IMPRESSION: Bibasilar airspace opacities, right greater than left. This could reflect atelectasis or pneumonia. Electronically Signed   By: Charlett Nose M.D.   On: 03/18/2023 01:42   DG Knee Complete 4 Views Right  Result Date: 03/18/2023 CLINICAL DATA:  Knee pain EXAM: RIGHT KNEE - COMPLETE 4+ VIEW COMPARISON:  None Available. FINDINGS: Prior right knee replacement. No hardware complicating feature. Large joint effusion. Anterior soft tissue swelling. No acute bony abnormality. Specifically, no fracture, subluxation, or dislocation. IMPRESSION: Right knee replacement. Large joint effusion. No acute bony abnormality. Electronically Signed   By: Charlett Nose M.D.   On: 03/18/2023 01:41      Tanner [name] T. Vidhi Delellis [facility]  If 7PM-7AM, please contact night-coverage [email or web address] 03/18/2023, 12:02 PM

## 2023-03-18 NOTE — Evaluation (Signed)
Clinical/Bedside Swallow Evaluation Patient Details  Name: Tanner [name] MRN: [id number] Date of Birth: 27-Nov-1945  Today's Date: 03/18/2023 Time: SLP Start Time (ACUTE ONLY): 1253 SLP Stop Time (ACUTE ONLY): 1308 SLP Time Calculation (min) (ACUTE ONLY): 15 min  Past Medical History:  Past Medical History:  Diagnosis Date   Acute kidney failure (HCC)    Anxiety    Arthritis    Back pain    BPH (benign prostatic hypertrophy)    CHF (congestive heart failure) (HCC)    Clostridium difficile infection    Clotting disorder (HCC)    Depression    Diabetes (HCC) 12/11/2016   type 2    DVT (deep venous thrombosis) (HCC)    DVT of deep femoral vein, right (HCC) 06/29/2018   Edema, lower extremity    High cholesterol    Hypertension    Joint pain    Low back pain potentially associated with spinal stenosis    Neuromuscular disorder (HCC)    Neuropathy of lower extremity    bilateral   OSA (obstructive sleep apnea)    pt denies    Osteoarthritis    PE (pulmonary embolism)    Pneumonia    hx of x 2    Ventral hernia    Past Surgical History:  Past Surgical History:  Procedure Laterality Date   CARDIOVERSION N/A 08/19/2022   Procedure: CARDIOVERSION;  Surgeon: Meriam Sprague, MD;  Location: [facility];  Service: Cardiovascular;  Laterality: N/A;   EYE SURGERY     HERNIA REPAIR  1999   TOTAL KNEE ARTHROPLASTY Left 08/26/2020   Procedure: LEFT TOTAL KNEE ARTHROPLASTY;  Surgeon: Gean Birchwood, MD;  Location: [facility] ORS;  Service: Orthopedics;  Laterality: Left;   TOTAL KNEE ARTHROPLASTY Right 12/15/2022   Procedure: TOTAL KNEE ARTHROPLASTY;  Surgeon: Durene Romans, MD;  Location: [facility] ORS;  Service: Orthopedics;  Laterality: Right;   HPI:  Tanner [name]" Tanner [name] is a 77 yo male presenting to ED with AMS, productive cough, and body aches. Found to be hypoxic, which was improved with placement of 4L [place]. CXR with bibasilar airspace opacities, R>L, which may be reflective of atelectasis  or PNA. PMH includes paroxysmal A-fib on Eliquis, essential HTN, HLD, HFpEF, CKD 3A    Assessment / Plan / Recommendation  Clinical Impression  Pt reports occasionally having difficulty with large bites of food. He reports a ~6-8 year history of frequent PNA. He states that he typically swallows his pills whole with a cracker to follow which is effective for him. Oral motor exam significant for R sided asymmetry, which is thought to be due to Bells palsy. Pt with a delayed cough and wet vocal quality following trials of thin liquds via straw. He was observed with portions from his lunch tray with notable R side anterior spillage and oral residue. Given pt's signs of oral dysphagia as well as frequent PNA, recommend completing an MBS which will be scheduled as able. Recommend downgrading current diet to Dys 3 texture solids and continuing thin liquids pending MBS results. SLP Visit Diagnosis: Dysphagia, unspecified (R13.10)    Aspiration Risk  Mild aspiration risk    Diet Recommendation Dysphagia 3 (Mech soft);Thin liquid    Liquid Administration via: Cup;Straw Medication Administration: Whole meds with puree Supervision: Patient able to self feed;Intermittent supervision to cue for compensatory strategies Compensations: Minimize environmental distractions;Slow rate;Small sips/bites;Monitor for anterior loss Postural Changes: Seated upright at 90 degrees    Other  Recommendations Oral Care Recommendations: Oral care  BID    Recommendations for follow up therapy are one component of a multi-disciplinary discharge planning process, led by the attending physician.  Recommendations may be updated based on patient status, additional functional criteria and insurance authorization.  Follow up Recommendations Outpatient SLP      Assistance Recommended at Discharge    Functional Status Assessment Patient has had a recent decline in their functional status and demonstrates the ability to make  significant improvements in function in a reasonable and predictable amount of time.  Frequency and Duration min 2x/week  2 weeks       Prognosis Prognosis for improved oropharyngeal function: Good Barriers to Reach Goals: Time post onset      Swallow Study   General HPI: Tanner [name]" Tanner [name] is a 77 yo male presenting to ED with AMS, productive cough, and body aches. Found to be hypoxic, which was improved with placement of 4L [place]. CXR with bibasilar airspace opacities, R>L, which may be reflective of atelectasis or PNA. PMH includes paroxysmal A-fib on Eliquis, essential HTN, HLD, HFpEF, CKD 3A Type of Study: Bedside Swallow Evaluation Previous Swallow Assessment: none in chart Diet Prior to this Study: Regular;Thin liquids (Level 0) Temperature Spikes Noted: No Respiratory Status: Nasal cannula History of Recent Intubation: No Behavior/Cognition: Alert;Cooperative;Pleasant mood Oral Cavity Assessment: Within Functional Limits Oral Care Completed by SLP: No Oral Cavity - Dentition: Dentures, top;Dentures, bottom Vision: Functional for self-feeding Self-Feeding Abilities: Needs set up Patient Positioning: Upright in bed Baseline Vocal Quality: Normal Volitional Cough: Congested;Strong Volitional Swallow: Able to elicit    Oral/Motor/Sensory Function Overall Oral Motor/Sensory Function: Within functional limits   Ice Chips Ice chips: Not tested   Thin Liquid Thin Liquid: Impaired Presentation: Straw;Self Fed Pharyngeal  Phase Impairments: Cough - Delayed;Wet Vocal Quality    Nectar Thick Nectar Thick Liquid: Not tested   Honey Thick Honey Thick Liquid: Not tested   Puree Puree: Within functional limits Presentation: Spoon;Self Fed   Solid     Solid: Impaired Presentation: Self Fed Oral Phase Functional Implications: Oral residue      Gwynneth Aliment, M.A., CF-SLP Speech Language Pathology, Acute Rehabilitation Services  Secure Chat  preferred [phone]  03/18/2023,1:32 PM

## 2023-03-18 NOTE — Progress Notes (Signed)
Per Radiologist Dr. Chase Picket patient has shrapnel in the right orbit. Patient can not have MRI

## 2023-03-19 ENCOUNTER — Inpatient Hospital Stay (HOSPITAL_COMMUNITY): Payer: No Typology Code available for payment source

## 2023-03-19 ENCOUNTER — Ambulatory Visit: Payer: Medicare HMO | Admitting: Internal Medicine

## 2023-03-19 DIAGNOSIS — N1831 Chronic kidney disease, stage 3a: Secondary | ICD-10-CM | POA: Diagnosis not present

## 2023-03-19 DIAGNOSIS — I10 Essential (primary) hypertension: Secondary | ICD-10-CM

## 2023-03-19 DIAGNOSIS — L8990 Pressure ulcer of unspecified site, unspecified stage: Secondary | ICD-10-CM

## 2023-03-19 DIAGNOSIS — G4733 Obstructive sleep apnea (adult) (pediatric): Secondary | ICD-10-CM

## 2023-03-19 DIAGNOSIS — I5022 Chronic systolic (congestive) heart failure: Secondary | ICD-10-CM

## 2023-03-19 DIAGNOSIS — I480 Paroxysmal atrial fibrillation: Secondary | ICD-10-CM

## 2023-03-19 DIAGNOSIS — Z96651 Presence of right artificial knee joint: Secondary | ICD-10-CM

## 2023-03-19 DIAGNOSIS — Z86711 Personal history of pulmonary embolism: Secondary | ICD-10-CM

## 2023-03-19 DIAGNOSIS — J690 Pneumonitis due to inhalation of food and vomit: Secondary | ICD-10-CM | POA: Diagnosis not present

## 2023-03-19 LAB — BASIC METABOLIC PANEL
Anion gap: 10 (ref 5–15)
BUN: 20 mg/dL (ref 8–23)
CO2: 23 mmol/L (ref 22–32)
Calcium: 8.6 mg/dL — ABNORMAL LOW (ref 8.9–10.3)
Chloride: 104 mmol/L (ref 98–111)
Creatinine, Ser: 1.71 mg/dL — ABNORMAL HIGH (ref 0.61–1.24)
GFR, Estimated: 40 mL/min — ABNORMAL LOW (ref >60)
Glucose, Bld: 87 mg/dL (ref 70–99)
Potassium: 4.2 mmol/L (ref 3.5–5.1)
Sodium: 137 mmol/L (ref 135–145)

## 2023-03-19 LAB — CBC
HCT: 37.3 % — ABNORMAL LOW (ref 39.0–52.0)
Hemoglobin: 10.8 g/dL — ABNORMAL LOW (ref 13.0–17.0)
MCH: 24.2 pg — ABNORMAL LOW (ref 26.0–34.0)
MCHC: 29 g/dL — ABNORMAL LOW (ref 30.0–36.0)
MCV: 83.4 fL (ref 80.0–100.0)
Platelets: 155 10*3/uL (ref 150–400)
RBC: 4.47 MIL/uL (ref 4.22–5.81)
RDW: 17.3 % — ABNORMAL HIGH (ref 11.5–15.5)
WBC: 10.7 10*3/uL — ABNORMAL HIGH (ref 4.0–10.5)
nRBC: 0 % (ref 0.0–0.2)

## 2023-03-19 LAB — GLUCOSE, CAPILLARY
Glucose-Capillary: 76 mg/dL (ref 70–99)
Glucose-Capillary: 98 mg/dL (ref 70–99)
Glucose-Capillary: 87 mg/dL (ref 70–99)

## 2023-03-19 LAB — RPR: RPR Ser Ql: NONREACTIVE

## 2023-03-19 LAB — MAGNESIUM: Magnesium: 2.1 mg/dL (ref 1.7–2.4)

## 2023-03-19 MED ORDER — AZITHROMYCIN 500 MG PO TABS
500.0000 mg | ORAL_TABLET | Freq: Every day | ORAL | Status: DC
  Administered 2023-03-19: 500 mg via ORAL
  Filled 2023-03-19: qty 1

## 2023-03-19 NOTE — Progress Notes (Signed)
PROGRESS NOTE  ANTIWAN SOBALVARRO WUJ:[id number] DOB: Oct 19, 1945   PCP: Myrlene Broker, MD  Patient is from: Home.  Uses rolling walker at baseline.  DOA: 03/18/2023 LOS: 1  Chief complaints Chief Complaint  Patient presents with   Altered Mental Status     Brief Narrative / Interim history: 77 year old M with PMH of PE/PAF on Eliquis, HTN, HLD, HFmrEF, CKD-3A, OSA and recent right TKA by Dr. Charlann Boxer presented to ED with altered mental status, productive cough, myalgia and hypoxemia to 80s per EMS, and admitted for acute respiratory failure with hypoxia due to pneumonia.  CXR showed bibasilar opacities, right > left.  He also had significant right knee effusion with RLE swelling and erythema.  He was hemodynamically stable.  Lower extremity venous Doppler ordered.  Started on ceftriaxone and Zithromax, and admitted.  Patient meets criteria for severe sepsis due to aspiration pneumonia.  Blood cultures NGTD.  Encephalopathy improved.  Subjective: Seen and examined earlier this morning.  No major events overnight of this morning.  No specific complaints but somewhat confused about time of the day.  He reports some cough but no chest pain or shortness of breath.  Denies GI or UTI symptoms.  Objective: Vitals:   03/18/23 2024 03/19/23 0545 03/19/23 0740 03/19/23 1133  BP: (!) 143/56 131/63 (!) 140/54 (!) 145/61  Pulse: 79 81 85 70  Resp:  18    Temp: 99.3 F (37.4 C) 98 F (36.7 C) 97.7 F (36.5 C)   TempSrc:   Oral   SpO2: 98% 98% 100% 100%  Weight:      Height:        Examination:  GENERAL: No apparent distress.  Nontoxic. HEENT: MMM.  Vision and hearing grossly intact.  NECK: Supple.  No apparent JVD.  RESP:  No IWOB.  Fair aeration bilaterally but limited exam due to body habitus. CVS:  RRR. Heart sounds normal.  ABD/GI/GU: BS+. Abd soft, NTND.  MSK/EXT: Right lower extremity swelling and erythema improved.  Still with right knee effusion.  Fair range of motion in right  knee.  Improved warmth to touch. SKIN: As above NEURO: Awake, alert and oriented x 4 except date.  No apparent focal neuro deficit.  Right eye drooping likely chronic. PSYCH: Calm. Normal affect.   Procedures:  None  Microbiology summarized: COVID-19, influenza and RSV PCR nonreactive Blood cultures NGTD  Assessment and plan: Principal Problem:   Aspiration pneumonia (HCC) Active Problems:   Essential hypertension   Chronic kidney disease (CKD), stage III (moderate) (HCC)   History of pulmonary embolus (PE)   OSA (obstructive sleep apnea)   Morbid obesity (HCC)   Chronic systolic heart failure (HCC)   Paroxysmal atrial fibrillation (HCC)   S/P total knee arthroplasty, right   Pressure injury of skin   Severe sepsis (HCC)  Severe sepsis due to bibasilar aspiration pneumonia and RLE cellulitis: POA.  Had leukocytosis, tachypnea, respiratory failure and mental status change on presentation.  Concern for aspiration pneumonia versus CAP given mental status change and risk for aspiration.  He also have RLE cellulitis.  Sepsis physiology, respiratory status and mental status improved.  Blood cultures NGTD -Continue IV ceftriaxone.  Given improvement, I would not add anaerobic coverage -Continue Zithromax for 3 days although low suspicion for atypical infection -Appreciate input by SLP-dysphagia 3 diet and MBS -Aspiration precaution -Pulmonary toilet -PT/OT eval   Acute hypoxic respiratory failure secondary to the above: Improved.  Currently saturating in upper 90s on 2 L. -Wean  oxygen as able -Further management as above  Generalized weakness with fall: Reportedly fell 1 week ago.  No apparent trauma but significant right knee effusion. RLE edema/cellulitis post fall: Right knee x-ray showed large effusion.  LE venous Doppler negative for DVT. History of right TKA in 12/2022 by Dr. Charlann Boxer Large right knee effusion -Orthopedic surgery consulted and did not feel he needs aspiration.   Recommended outpatient follow-up with Dr. Charlann Boxer -Antibiotics as above -Fall precaution -PT/OT   Acute metabolic encephalopathy: Multifactorial including severe sepsis, respiratory failure and polypharmacy.    Encephalopathy improved this morning.  No focal neurodeficit on exam.  UDS, ammonia, B12, VBG, TSH and CT head unrevealing. -Treat treatable causes -Reorientation and delirium precaution -Discontinued sedating medication  Chronic HFmrEF: TTE in 08/2022 with LVEF of 45 to 50%, GH, indeterminate DD, and normal RV SF.  Appears euvolemic except for right lower extremity swelling.  -Continue holding diuretics -Strict intake and output, daily weights, renal functions and electrolytes   DM-2 with hyperglycemia and hypoglycemia: A1c 4.8% in 11/2022.  On Jardiance and Ozempic at home. -Discontinued CBG monitoring and SSI. -Recheck hemoglobin A1c  Paroxysmal A-fib: Rate controlled. -Continue home Coreg at reduced dose -Continue home amiodarone and Eliquis -Optimize electrolytes  OSA: VBG reassuring. -Order CPAP nightly  CKD-3A: Stable -Continue monitoring   Hypertension: Normotensive -Continue home Coreg at reduced dose  Morbid obesity: Elevated BMI with comorbidity as above Body mass index is 38.01 kg/m. -Already on Ozempic.  Pressure skin injury: POA Pressure Injury 03/18/23 Buttocks Left;Right Stage 2 -  Partial thickness loss of dermis presenting as a shallow open injury with a red, pink wound bed without slough. (Active)  03/18/23 0629  Location: Buttocks  Location Orientation: Left;Right  Staging: Stage 2 -  Partial thickness loss of dermis presenting as a shallow open injury with a red, pink wound bed without slough.  Wound Description (Comments):   Present on Admission: Yes  Dressing Type Foam - Lift dressing to assess site every shift 03/18/23 2058   DVT prophylaxis:   apixaban (ELIQUIS) tablet 5 mg  Code Status: Full code Family Communication: None at  baseline Level of care: Telemetry Medical Status is: Inpatient Remains inpatient appropriate because: Severe sepsis due to aspiration pneumonia and right lower extremity cellulitis   Final disposition: TBD Consultants:  Orthopedic surgery  55 minutes with more than 50% spent in reviewing records, counseling patient/family and coordinating care.   Sch Meds:  Scheduled Meds:  amiodarone  200 mg Oral Daily   apixaban  5 mg Oral BID   atorvastatin  80 mg Oral Daily   azithromycin  500 mg Oral Daily   buPROPion  150 mg Oral BID   carvedilol  3.125 mg Oral BID WC   finasteride  5 mg Oral QHS   Continuous Infusions:  cefTRIAXone (ROCEPHIN)  IV 2 g (03/19/23 0851)   PRN Meds:.acetaminophen, guaiFENesin, polyethylene glycol  Antimicrobials: Anti-infectives (From admission, onward)    Start     Dose/Rate Route Frequency Ordered Stop   03/19/23 2200  azithromycin (ZITHROMAX) tablet 500 mg        500 mg Oral Daily 03/19/23 1026 03/22/23 2159   03/18/23 2200  azithromycin (ZITHROMAX) 500 mg in sodium chloride 0.9 % 250 mL IVPB  Status:  Discontinued        500 mg 250 mL/hr over 60 Minutes Intravenous Every 24 hours 03/18/23 0543 03/19/23 1026   03/18/23 1000  cefTRIAXone (ROCEPHIN) 2 g in sodium chloride 0.9 % 100  mL IVPB        2 g 200 mL/hr over 30 Minutes Intravenous Every 24 hours 03/18/23 0543 03/22/23 0959   03/18/23 0230  cefTRIAXone (ROCEPHIN) 1 g in sodium chloride 0.9 % 100 mL IVPB        1 g 200 mL/hr over 30 Minutes Intravenous  Once 03/18/23 0228 03/18/23 0327   03/18/23 0230  azithromycin (ZITHROMAX) 500 mg in sodium chloride 0.9 % 250 mL IVPB        500 mg 250 mL/hr over 60 Minutes Intravenous  Once 03/18/23 0228 03/18/23 0402        I have personally reviewed the following labs and images: CBC: Recent Labs  Lab 03/18/23 0207 03/18/23 0211 03/18/23 0842 03/19/23 0758  WBC 13.6*  --  10.7* 10.7*  NEUTROABS 10.8*  --   --   --   HGB 13.0 15.3 11.1* 10.8*   HCT 42.8 45.0 37.1* 37.3*  MCV 80.3  --  80.0 83.4  PLT 197  --  161 155   BMP &GFR Recent Labs  Lab 03/18/23 0207 03/18/23 0211 03/18/23 0842 03/19/23 0758  NA 137 139 136 137  K 4.1 4.1 4.1 4.2  CL 101  --  101 104  CO2 23  --  21* 23  GLUCOSE 80  --  124* 87  BUN 18  --  18 20  CREATININE 1.62*  --  1.61* 1.71*  CALCIUM 9.1  --  8.5* 8.6*  MG  --   --  1.9 2.1  PHOS  --   --  3.0  --    Estimated Creatinine Clearance: 40.1 mL/min (A) (by C-G formula based on SCr of 1.71 mg/dL (H)). Liver & Pancreas: Recent Labs  Lab 03/18/23 0207 03/18/23 0842  AST 15  --   ALT 16  --   ALKPHOS 85  --   BILITOT 1.2  --   PROT 6.8  --   ALBUMIN 3.4* 2.6*   No results for input(s): "LIPASE", "AMYLASE" in the last 168 hours. Recent Labs  Lab 03/18/23 1831  AMMONIA 19   Diabetic: No results for input(s): "HGBA1C" in the last 72 hours. Recent Labs  Lab 03/18/23 1214 03/18/23 1653 03/18/23 2023 03/19/23 0736 03/19/23 1132  GLUCAP 92 79 97 76 98   Cardiac Enzymes: No results for input(s): "CKTOTAL", "CKMB", "CKMBINDEX", "TROPONINI" in the last 168 hours. No results for input(s): "PROBNP" in the last 8760 hours. Coagulation Profile: Recent Labs  Lab 03/18/23 0325  INR 1.5*   Thyroid Function Tests: Recent Labs    03/18/23 0207  TSH 8.665*  FREET4 0.65   Lipid Profile: No results for input(s): "CHOL", "HDL", "LDLCALC", "TRIG", "CHOLHDL", "LDLDIRECT" in the last 72 hours. Anemia Panel: Recent Labs    03/18/23 1831  VITAMINB12 362   Urine analysis:    Component Value Date/Time   COLORURINE YELLOW 03/18/2023 2213   APPEARANCEUR CLEAR 03/18/2023 2213   LABSPEC 1.017 03/18/2023 2213   PHURINE 5.0 03/18/2023 2213   GLUCOSEU >=500 (A) 03/18/2023 2213   GLUCOSEU NEGATIVE 09/13/2007 1028   HGBUR SMALL (A) 03/18/2023 2213   BILIRUBINUR NEGATIVE 03/18/2023 2213   BILIRUBINUR negative 04/19/2015 1417   BILIRUBINUR neg 02/12/2013 1449   KETONESUR NEGATIVE  03/18/2023 2213   PROTEINUR 100 (A) 03/18/2023 2213   UROBILINOGEN 0.2 04/19/2015 1417   UROBILINOGEN 0.2 mg/dL 29/51/8841 6606   NITRITE NEGATIVE 03/18/2023 2213   LEUKOCYTESUR SMALL (A) 03/18/2023 2213   Sepsis Labs: Invalid input(s): "  PROCALCITONIN", "LACTICIDVEN"  Microbiology: Recent Results (from the past 240 hour(s))  Blood Culture (routine x 2)     Status: None (Preliminary result)   Collection Time: 03/18/23  3:22 AM   Specimen: BLOOD RIGHT HAND  Result Value Ref Range Status   Specimen Description BLOOD RIGHT HAND  Final   Special Requests   Final    BOTTLES DRAWN AEROBIC AND ANAEROBIC Blood Culture results may not be optimal due to an inadequate volume of blood received in culture bottles   Culture   Final    NO GROWTH 1 DAY Performed at [facility], [address], [place], [place] [address]    Report Status PENDING  Incomplete  Resp panel by RT-PCR (RSV, Flu A&B, Covid) Anterior Nasal Swab     Status: None   Collection Time: 03/18/23  3:32 AM   Specimen: Anterior Nasal Swab  Result Value Ref Range Status   SARS Coronavirus 2 by RT PCR NEGATIVE NEGATIVE Final   Influenza A by PCR NEGATIVE NEGATIVE Final   Influenza B by PCR NEGATIVE NEGATIVE Final    Comment: (NOTE) The Xpert Xpress SARS-CoV-2/FLU/RSV plus assay is intended as an aid in the diagnosis of influenza from Nasopharyngeal swab specimens and should not be used as a sole basis for treatment. Nasal washings and aspirates are unacceptable for Xpert Xpress SARS-CoV-2/FLU/RSV testing.  Fact Sheet for Patients: BloggerCourse.com  Fact Sheet for Healthcare Providers: SeriousBroker.it  This test is not yet approved or cleared by the [place] FDA and has been authorized for detection and/or diagnosis of SARS-CoV-2 by FDA under an Emergency Use Authorization (EUA). This EUA will remain in effect (meaning this test can be used) for the duration of  the COVID-19 declaration under Section 564(b)(1) of the Act, 21 U.S.C. section 360bbb-3(b)(1), unless the authorization is terminated or revoked.     Resp Syncytial Virus by PCR NEGATIVE NEGATIVE Final    Comment: (NOTE) Fact Sheet for Patients: BloggerCourse.com  Fact Sheet for Healthcare Providers: SeriousBroker.it  This test is not yet approved or cleared by the [place] FDA and has been authorized for detection and/or diagnosis of SARS-CoV-2 by FDA under an Emergency Use Authorization (EUA). This EUA will remain in effect (meaning this test can be used) for the duration of the COVID-19 declaration under Section 564(b)(1) of the Act, 21 U.S.C. section 360bbb-3(b)(1), unless the authorization is terminated or revoked.  Performed at [facility], [address], [place], [place] [address]   Blood Culture (routine x 2)     Status: None (Preliminary result)   Collection Time: 03/18/23  8:42 AM   Specimen: BLOOD LEFT HAND  Result Value Ref Range Status   Specimen Description BLOOD LEFT HAND  Final   Special Requests   Final    BOTTLES DRAWN AEROBIC AND ANAEROBIC Blood Culture results may not be optimal due to an inadequate volume of blood received in culture bottles   Culture   Final    NO GROWTH < 24 HOURS Performed at [facility], [address], [place], [place] [address]    Report Status PENDING  Incomplete    Radiology Studies: No results found.    Ison Wichmann T. Stefanny Pieri [facility]  If 7PM-7AM, please contact night-coverage [email or web address] 03/19/2023, 11:45 AM

## 2023-03-19 NOTE — Progress Notes (Signed)
Speech Language Pathology Treatment: Dysphagia  Patient Details Name: GEORGIOS KINA MRN: [id number] DOB: 12/08/1945 Today's Date: 03/19/2023 Time: 1478-2956 SLP Time Calculation (min) (ACUTE ONLY): 9 min  Assessment / Plan / Recommendation Clinical Impression  Pt and son provided education regarding compensatory strategies and swallow function. SLP reviewed MBS images in detail and all questions were answered to their satisfaction. Pt verbalized agreement with f/u SLP treatment targeting compensatory strategies and management of oral deficits in the presence of R sided weakness due to Bells palsy. Educated pt and his son on potential for swallow function to decline in times of acute deconditioning and they verbalized understanding of the need to take increased precaution. Current diet recommendation remains appropriate. SLP will continue to f/u to target use of compensatory strategies.    HPI HPI: Jaedyn "[name]" DEROLD DORSCH is a 77 yo male presenting to ED with AMS, productive cough, and body aches. Found to be hypoxic, which was improved with placement of 4L [place]. CXR with bibasilar airspace opacities, R>L, which may be reflective of atelectasis or PNA. PMH includes paroxysmal A-fib on Eliquis, essential HTN, HLD, HFpEF, CKD 3A      SLP Plan  Continue with current plan of care      Recommendations for follow up therapy are one component of a multi-disciplinary discharge planning process, led by the attending physician.  Recommendations may be updated based on patient status, additional functional criteria and insurance authorization.    Recommendations  Diet recommendations: Dysphagia 3 (mechanical soft);Thin liquid Liquids provided via: Cup;Straw Medication Administration: Crushed with puree Supervision: Intermittent supervision to cue for compensatory strategies;Staff to assist with self feeding Compensations: Minimize environmental distractions;Slow rate;Small sips/bites;Monitor for anterior  loss;Multiple dry swallows after each bite/sip;Clear throat intermittently;Hard cough after swallow Postural Changes and/or Swallow Maneuvers: Seated upright 90 degrees;Upright 30-60 min after meal                  Oral care BID   Frequent or constant Supervision/Assistance Dysphagia, oropharyngeal phase (R13.12)     Continue with current plan of care     Gwynneth Aliment, M.A., CF-SLP Speech Language Pathology, Acute Rehabilitation Services  Secure Chat preferred [phone]   03/19/2023, 4:06 PM

## 2023-03-19 NOTE — Evaluation (Signed)
Occupational Therapy Evaluation Patient Details Name: Tanner [name] MRN: [id number] DOB: Jul 23, 1945 Today's Date: 03/19/2023   History of Present Illness 77 yo male presents [facility] on 10[facility] with AMS, fall, SOB. R knee xray shows large effusion. Pt with sepsis due to bibasilar aspiration PNA with associated respiratory failure, RLE cellulitis. PMH includes R TKR 12/15/2022, L TKR 2022, anxiety, BPH, HF, depression, DMII, DVT, HTN, PE, afib, CKDIII.   Clinical Impression   Pt lives alone, walks with either his RW or cane and is independent in self care, light meal prep and drives. Pt has a housekeeper for heavy cleaning. Pt presents with impaired cognition, generalized weakness and impaired standing balance. He was able to perform bed mobility and step pivot to chair with min assist and RW. Pt needs set up to total assist for ADLs. SpO2 92% on RA, decreased O2 to 1L, RN aware. Pt with wound on his buttocks, reports he has had it for a while, RN notified. Patient will benefit from continued inpatient follow up therapy, <3 hours/day       If plan is discharge home, recommend the following: Two people to help with walking and/or transfers;A lot of help with bathing/dressing/bathroom;Assistance with cooking/housework;Assistance with feeding;Assist for transportation;Help with stairs or ramp for entrance    Functional Status Assessment  Patient has had a recent decline in their functional status and demonstrates the ability to make significant improvements in function in a reasonable and predictable amount of time.  Equipment Recommendations  BSC/3in1    Recommendations for Other Services       Precautions / Restrictions Precautions Precautions: Fall Precaution Comments: R TKR 12/2022, RLE cellulitis Restrictions Weight Bearing Restrictions: No      Mobility Bed Mobility Overal bed mobility: Needs Assistance Bed Mobility: Supine to Sit     Supine to sit: Min assist     General bed  mobility comments: min assist to raise trunk    Transfers Overall transfer level: Needs assistance Equipment used: Rolling walker (2 wheels) Transfers: Sit to/from Stand Sit to Stand: Min assist, From elevated surface           General transfer comment: assist to rise and steady      Balance Overall balance assessment: Needs assistance, History of Falls Sitting-balance support: Feet supported, Bilateral upper extremity supported Sitting balance-Leahy Scale: Fair       Standing balance-Leahy Scale: Poor Standing balance comment: min assist and RW                           ADL either performed or assessed with clinical judgement   ADL Overall ADL's : Needs assistance/impaired Eating/Feeding: Set up;Sitting   Grooming: Wash/dry hands;Wash/dry face;Sitting;Set up   Upper Body Bathing: Moderate assistance;Sitting   Lower Body Bathing: Total assistance;Sit to/from stand   Upper Body Dressing : Minimal assistance;Sitting   Lower Body Dressing: Total assistance;Bed level   Toilet Transfer: Minimal assistance;Rolling walker (2 wheels);Stand-pivot   Toileting- Architect and Hygiene: Total assistance;Sit to/from stand               Vision Baseline Vision/History: 1 Wears glasses Patient Visual Report: No change from baseline       Perception         Praxis         Pertinent Vitals/Pain Pain Assessment Pain Assessment: Faces Faces Pain Scale: Hurts little more Pain Location: buttocks Pain Descriptors / Indicators: Sore Pain Intervention(s): Monitored during session,  Repositioned     Extremity/Trunk Assessment Upper Extremity Assessment Upper Extremity Assessment: Generalized weakness   Lower Extremity Assessment Lower Extremity Assessment: Defer to PT evaluation   Cervical / Trunk Assessment Cervical / Trunk Assessment: Kyphotic   Communication Communication Communication: No apparent difficulties   Cognition Arousal:  Alert Behavior During Therapy: WFL for tasks assessed/performed Overall Cognitive Status: Impaired/Different from baseline Area of Impairment: Memory, Orientation                 Orientation Level: Situation, Time   Memory: Decreased short-term memory               General Comments       Exercises     Shoulder Instructions      Home Living Family/patient expects to be discharged to:: Private residence Living Arrangements: Alone Available Help at Discharge: Family;Available PRN/intermittently Type of Home: House Home Access: Stairs to enter [employer] of Steps: 4-5 Entrance Stairs-Rails: Right Home Layout: One level     Bathroom Shower/Tub: Producer, television/film/video: Handicapped height     Home Equipment: Agricultural consultant (2 wheels);Cane - single point;Grab bars - tub/shower          Prior Functioning/Environment Prior Level of Function : Independent/Modified Independent;Driving             Mobility Comments: endorses falls, uses cane or walker ADLs Comments: independent, stands to shower, has a housekeeper for heavy cleaning        OT Problem List: Decreased strength;Decreased activity tolerance;Impaired balance (sitting and/or standing);Decreased cognition;Decreased knowledge of use of DME or AE;Pain;Obesity      OT Treatment/Interventions: Self-care/ADL training;DME and/or AE instruction;Therapeutic activities;Patient/family education;Balance training    OT Goals(Current goals can be found in the care plan section) Acute Rehab OT Goals OT Goal Formulation: With patient Time For Goal Achievement: 04/02/23 Potential to Achieve Goals: Good ADL Goals Pt Will Perform Grooming: with contact guard assist;standing (at least one activity) Pt Will Perform Upper Body Dressing: with supervision;with set-up;sitting Pt Will Transfer to Toilet: with contact guard assist;ambulating;bedside commode Pt Will Perform Toileting - Clothing  Manipulation and hygiene: with contact guard assist;sit to/from stand Additional ADL Goal #1: Pt will complete bed mobility mod I in preparation for ADLs.  OT Frequency: Min 1X/week    Co-evaluation              AM-PAC OT "6 Clicks" Daily Activity     Outcome Measure Help from another person eating meals?: A Little Help from another person taking care of personal grooming?: A Little Help from another person toileting, which includes using toliet, bedpan, or urinal?: Total Help from another person bathing (including washing, rinsing, drying)?: A Lot Help from another person to put on and taking off regular upper body clothing?: A Little Help from another person to put on and taking off regular lower body clothing?: Total 6 Click Score: 13   End of Session Equipment Utilized During Treatment: Gait belt;Rolling walker (2 wheels) Nurse Communication: Mobility status;Other (comment) (has sore on buttocks)  Activity Tolerance: Patient tolerated treatment well Patient left: in chair;with call bell/phone within reach;with chair alarm set  OT Visit Diagnosis: Unsteadiness on feet (R26.81);Other abnormalities of gait and mobility (R26.89);Pain;Muscle weakness (generalized) (M62.81);Other symptoms and signs involving cognitive function                Time: 4010-2725 OT Time Calculation (min): 46 min Charges:  OT General Charges $OT Visit: 1 Visit OT Evaluation $OT Eval  Moderate Complexity: 1 Mod OT Treatments $Self Care/Home Management : 23-37 mins Berna Spare, OTR/L Acute Rehabilitation Services Office: [phone]  Evern Bio 03/19/2023, 9:41 AM

## 2023-03-19 NOTE — Progress Notes (Signed)
Modified Barium Swallow Study  Patient Details  Name: Tanner [name] MRN: [id number] Date of Birth: 1946-05-17  Today's Date: 03/19/2023  Modified Barium Swallow completed.  Full report located under Chart Review in the Imaging Section.  History of Present Illness Aceson "[name]" Tanner [name] is a 77 yo male presenting to ED with AMS, productive cough, and body aches. Found to be hypoxic, which was improved with placement of 4L [place]. CXR with bibasilar airspace opacities, R>L, which may be reflective of atelectasis or PNA. PMH includes paroxysmal A-fib on Eliquis, essential HTN, HLD, HFpEF, CKD 3A   Clinical Impression Pt presents with a moderate oropharyngeal dysphagia in the setting of R sided weakness caused by Bells palsy as well as acute deconditioning. Pt's oral phase is characterized by R anterior spillage and moderate residue, which pt has difficulty clearing with the use of a liquid wash. He has reduced tongue base retraction contributing to significant vallecular residue across all trials. Direct visualization of the trachea is hard to achieve due to body habitus and positioning. Penetration of thin liquids without ejection noted (PAS 3), although suspect penetrates may have reached the level of the vocal folds before being sensed and attempts were made to expel. This appeared to improve with the use of a chin tuck posture, however, this continues to be difficult to assess without direct visualization. Pt has copious pharyngeal residue which increases the risk of airway invasion when the pt is at rest and the laryngeal vestibule remains unprotected. During the swallow, pt achieves excellent laryngeal elevation and closure. Suspect pt's most significant risk for aspiration is after the swallow due to residue. Recommend continuing diet of Dys 3 texture solids due to oral deficits with thin liquids and use of a chin tuck and frequent throat clearing and coughing. He may require increased supervision to  provide cueing and monitor for anterior loss and oral residue. Will continue to follow. Factors that may increase risk of adverse event in presence of aspiration [employer] 2021): Respiratory or GI disease;Limited mobility  Swallow Evaluation Recommendations Recommendations: PO diet PO Diet Recommendation: Dysphagia 3 (Mechanical soft);Thin liquids (Level 0) Liquid Administration via: Cup;Straw Medication Administration: Crushed with puree Supervision: Staff to assist with self-feeding;Full supervision/cueing for swallowing strategies Swallowing strategies  : Minimize environmental distractions;Slow rate;Small bites/sips;Check for anterior loss;Multiple dry swallows after each bite/sip;Clear throat intermittently;Hard cough after swallowing Postural changes: Position pt fully upright for meals;Stay upright 30-60 min after meals Oral care recommendations: Oral care QID (4x/day)      Gwynneth Aliment, M.A., CF-SLP Speech Language Pathology, Acute Rehabilitation Services  Secure Chat preferred [phone]  03/19/2023,12:05 PM

## 2023-03-19 NOTE — Progress Notes (Signed)
PHARMACIST - PHYSICIAN COMMUNICATION DR:   Alanda Slim CONCERNING: Antibiotic IV to Oral Route Change Policy  RECOMMENDATION: This patient is receiving azithromycin by the intravenous route.  Based on criteria approved by the Pharmacy and Therapeutics Committee, the antibiotic(s) is/are being converted to the equivalent oral dose form(s).   DESCRIPTION: These criteria include: Patient being treated for a respiratory tract infection, urinary tract infection, cellulitis or clostridium difficile associated diarrhea if on metronidazole The patient is not neutropenic and does not exhibit a GI malabsorption state The patient is eating (either orally or via tube) and/or has been taking other orally administered medications for a least 24 hours The patient is improving clinically and has a Tmax < 100.5  If you have questions about this conversion, please contact the Pharmacy Department  []   [phone] )  [facility] [x]   [phone] )  [facility]  []   [phone] )  [facility] []   [phone] )  [facility]   Rexford Maus, PharmD, BCPS 03/19/2023 10:26 AM

## 2023-03-20 DIAGNOSIS — N1831 Chronic kidney disease, stage 3a: Secondary | ICD-10-CM | POA: Diagnosis not present

## 2023-03-20 DIAGNOSIS — J690 Pneumonitis due to inhalation of food and vomit: Secondary | ICD-10-CM | POA: Diagnosis not present

## 2023-03-20 DIAGNOSIS — Z86711 Personal history of pulmonary embolism: Secondary | ICD-10-CM | POA: Diagnosis not present

## 2023-03-20 DIAGNOSIS — I10 Essential (primary) hypertension: Secondary | ICD-10-CM | POA: Diagnosis not present

## 2023-03-20 LAB — RENAL FUNCTION PANEL
Albumin: 2.5 g/dL — ABNORMAL LOW (ref 3.5–5.0)
Anion gap: 10 (ref 5–15)
BUN: 17 mg/dL (ref 8–23)
CO2: 25 mmol/L (ref 22–32)
Calcium: 8.5 mg/dL — ABNORMAL LOW (ref 8.9–10.3)
Chloride: 101 mmol/L (ref 98–111)
Creatinine, Ser: 1.46 mg/dL — ABNORMAL HIGH (ref 0.61–1.24)
GFR, Estimated: 49 mL/min — ABNORMAL LOW (ref >60)
Glucose, Bld: 97 mg/dL (ref 70–99)
Phosphorus: 3.4 mg/dL (ref 2.5–4.6)
Potassium: 4.3 mmol/L (ref 3.5–5.1)
Sodium: 136 mmol/L (ref 135–145)

## 2023-03-20 LAB — CBC
HCT: 38.8 % — ABNORMAL LOW (ref 39.0–52.0)
Hemoglobin: 11.4 g/dL — ABNORMAL LOW (ref 13.0–17.0)
MCH: 24.3 pg — ABNORMAL LOW (ref 26.0–34.0)
MCHC: 29.4 g/dL — ABNORMAL LOW (ref 30.0–36.0)
MCV: 82.6 fL (ref 80.0–100.0)
Platelets: 234 10*3/uL (ref 150–400)
RBC: 4.7 MIL/uL (ref 4.22–5.81)
RDW: 17.2 % — ABNORMAL HIGH (ref 11.5–15.5)
WBC: 10.4 10*3/uL (ref 4.0–10.5)
nRBC: 0 % (ref 0.0–0.2)

## 2023-03-20 LAB — MAGNESIUM: Magnesium: 2.2 mg/dL (ref 1.7–2.4)

## 2023-03-20 LAB — GLUCOSE, CAPILLARY: Glucose-Capillary: 89 mg/dL (ref 70–99)

## 2023-03-20 LAB — PROCALCITONIN
Procalcitonin: 0.39 ng/mL
Procalcitonin: 0.32 ng/mL

## 2023-03-20 MED ORDER — CARVEDILOL 6.25 MG PO TABS
6.2500 mg | ORAL_TABLET | Freq: Two times a day (BID) | ORAL | Status: DC
  Administered 2023-03-20 – 2023-03-26 (×12): 6.25 mg via ORAL
  Filled 2023-03-20 (×12): qty 1

## 2023-03-20 MED ORDER — AMOXICILLIN-POT CLAVULANATE 875-125 MG PO TABS
1.0000 | ORAL_TABLET | Freq: Two times a day (BID) | ORAL | Status: DC
  Administered 2023-03-20: 1 via ORAL
  Filled 2023-03-20 (×2): qty 1

## 2023-03-20 MED ORDER — AMOXICILLIN-POT CLAVULANATE 875-125 MG PO TABS
1.0000 | ORAL_TABLET | Freq: Two times a day (BID) | ORAL | Status: AC
  Administered 2023-03-20 – 2023-03-24 (×9): 1 via ORAL
  Filled 2023-03-20 (×9): qty 1

## 2023-03-20 MED ORDER — TORSEMIDE 20 MG PO TABS
40.0000 mg | ORAL_TABLET | Freq: Every day | ORAL | Status: DC
  Administered 2023-03-20 – 2023-03-22 (×3): 40 mg via ORAL
  Filled 2023-03-20 (×3): qty 2

## 2023-03-20 NOTE — Progress Notes (Signed)
Name: Tanner [name] MRN: [id number] Date of Birth: 1945-06-27 Today's Date: 03/19/2023 HPI/PMH: HPI: Tanner [name] is a 77 yo male presenting  to ED with AMS, productive cough, and body aches. Found to be hypoxic, which was improved with placement of 4L [place]. CXR with bibasilar airspace opacities, R>L, which may be reflective of atelectasis or PNA. 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 Pt's oral phase is characterized by R anterior spillage and moderate residue, which pt has difficulty clearing with the use of a liquid wash. He has reduced tongue base retraction contributing to significant vallecular residue across all trials. Direct visualization of the trachea is hard to achieve due to body habitus and positioning. Penetration of thin liquids without ejection noted (PAS 3), although suspect penetrates may have reached the level of the vocal folds before being sensed and attempts were made to expel. This appeared to improve with the use of a chin tuck posture, however, this continues to be difficult to assess without direct visualization. Pt has copious pharyngeal residue which increases the risk of airway invasion when the pt is at rest and the laryngeal vestibule remains unprotected. During the swallow, pt achieves excellent laryngeal elevation and closure. Suspect pt's most significant risk for aspiration is after the swallow due to residue. Recommend continuing diet of Dys 3 texture solids due to oral deficits with thin liquids and use of a chin tuck and frequent throat clearing and coughing. He may require increased supervision to provide cueing and monitor for anterior loss and oral residue. Will continue to follow. Factors that may increase risk of adverse event in presence of aspiration Rubye Oaks & Clearance Coots 2021): Factors that may increase risk of adverse event in presence of aspiration Rubye Oaks & Clearance Coots 2021): Respiratory or GI  disease; Limited mobility Recommendations/Plan: Swallowing Evaluation Recommendations Swallowing Evaluation Recommendations Recommendations: PO diet PO Diet Recommendation: Dysphagia 3 (Mechanical soft); Thin liquids (Level 0) Liquid Administration via: Cup; Straw Medication Administration: Crushed with puree Supervision: Staff to assist with self-feeding; Full supervision/cueing for swallowing strategies Swallowing strategies  : Minimize environmental distractions; Slow rate; Small bites/sips; Check for anterior loss; Multiple dry swallows after each bite/sip; Clear throat intermittently; Hard cough after swallowing Postural changes: Position pt fully upright for meals; Stay upright 30-60 min after meals Oral care recommendations: Oral care QID (4x/day) Treatment Plan Treatment Plan Treatment recommendations: Therapy as outlined in treatment plan below Follow-up recommendations: Skilled nursing-short term rehab (<3 hours/day) Functional status assessment: Patient has had a recent decline in their functional status and demonstrates the ability to make significant improvements in function in a reasonable and predictable amount of time. Treatment frequency: Min 2x/week Treatment duration: 2 weeks Interventions: Aspiration precaution training; Patient/family education; Compensatory techniques Recommendations Recommendations for follow up therapy are one component of a multi-disciplinary discharge planning process, led by the attending physician.  Recommendations may be updated based on patient status, additional functional criteria and insurance authorization. Assessment: Orofacial Exam: Orofacial Exam Oral Cavity: Oral Hygiene: WFL Oral Cavity - Dentition: Dentures, top; Dentures, bottom Orofacial Anatomy: WFL Oral Motor/Sensory Function: WFL Anatomy: Anatomy: WFL Boluses Administered: Boluses Administered Boluses Administered: Thin liquids (Level 0); Mildly thick liquids (Level 2, nectar thick); Moderately thick  liquids (Level 3, honey thick); Puree; Solid  Oral Impairment Domain: Oral Impairment Domain Lip Closure: Escape beyond  Name: Tanner [name] MRN: [id number] Date of Birth: 1945-06-27 Today's Date: 03/19/2023 HPI/PMH: HPI: Tanner [name] is a 77 yo male presenting  to ED with AMS, productive cough, and body aches. Found to be hypoxic, which was improved with placement of 4L [place]. CXR with bibasilar airspace opacities, R>L, which may be reflective of atelectasis or PNA. 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 Pt's oral phase is characterized by R anterior spillage and moderate residue, which pt has difficulty clearing with the use of a liquid wash. He has reduced tongue base retraction contributing to significant vallecular residue across all trials. Direct visualization of the trachea is hard to achieve due to body habitus and positioning. Penetration of thin liquids without ejection noted (PAS 3), although suspect penetrates may have reached the level of the vocal folds before being sensed and attempts were made to expel. This appeared to improve with the use of a chin tuck posture, however, this continues to be difficult to assess without direct visualization. Pt has copious pharyngeal residue which increases the risk of airway invasion when the pt is at rest and the laryngeal vestibule remains unprotected. During the swallow, pt achieves excellent laryngeal elevation and closure. Suspect pt's most significant risk for aspiration is after the swallow due to residue. Recommend continuing diet of Dys 3 texture solids due to oral deficits with thin liquids and use of a chin tuck and frequent throat clearing and coughing. He may require increased supervision to provide cueing and monitor for anterior loss and oral residue. Will continue to follow. Factors that may increase risk of adverse event in presence of aspiration Rubye Oaks & Clearance Coots 2021): Factors that may increase risk of adverse event in presence of aspiration Rubye Oaks & Clearance Coots 2021): Respiratory or GI  disease; Limited mobility Recommendations/Plan: Swallowing Evaluation Recommendations Swallowing Evaluation Recommendations Recommendations: PO diet PO Diet Recommendation: Dysphagia 3 (Mechanical soft); Thin liquids (Level 0) Liquid Administration via: Cup; Straw Medication Administration: Crushed with puree Supervision: Staff to assist with self-feeding; Full supervision/cueing for swallowing strategies Swallowing strategies  : Minimize environmental distractions; Slow rate; Small bites/sips; Check for anterior loss; Multiple dry swallows after each bite/sip; Clear throat intermittently; Hard cough after swallowing Postural changes: Position pt fully upright for meals; Stay upright 30-60 min after meals Oral care recommendations: Oral care QID (4x/day) Treatment Plan Treatment Plan Treatment recommendations: Therapy as outlined in treatment plan below Follow-up recommendations: Skilled nursing-short term rehab (<3 hours/day) Functional status assessment: Patient has had a recent decline in their functional status and demonstrates the ability to make significant improvements in function in a reasonable and predictable amount of time. Treatment frequency: Min 2x/week Treatment duration: 2 weeks Interventions: Aspiration precaution training; Patient/family education; Compensatory techniques Recommendations Recommendations for follow up therapy are one component of a multi-disciplinary discharge planning process, led by the attending physician.  Recommendations may be updated based on patient status, additional functional criteria and insurance authorization. Assessment: Orofacial Exam: Orofacial Exam Oral Cavity: Oral Hygiene: WFL Oral Cavity - Dentition: Dentures, top; Dentures, bottom Orofacial Anatomy: WFL Oral Motor/Sensory Function: WFL Anatomy: Anatomy: WFL Boluses Administered: Boluses Administered Boluses Administered: Thin liquids (Level 0); Mildly thick liquids (Level 2, nectar thick); Moderately thick  liquids (Level 3, honey thick); Puree; Solid  Oral Impairment Domain: Oral Impairment Domain Lip Closure: Escape beyond  PROGRESS NOTE  Tanner [name] ZOX:[id number] DOB: 1945/09/10   PCP: Myrlene Broker, MD  Patient is from: Home.  Uses rolling walker at baseline.  DOA: 03/18/2023 LOS: 2  Chief complaints Chief Complaint  Patient presents with   Altered Mental Status     Brief Narrative / Interim history: 77 year old M with PMH of PE/PAF on Eliquis, HTN, HLD, HFmrEF, CKD-3A, OSA and recent right TKA by Dr. Charlann Boxer presented to ED with altered mental status, productive cough, myalgia and hypoxemia to 80s per EMS, and admitted for acute respiratory failure with hypoxia due to pneumonia.  CXR showed bibasilar opacities, right > left.  He also had significant right knee effusion with RLE swelling and erythema.  He was hemodynamically stable.  Lower extremity venous Doppler ordered.  Started on ceftriaxone and Zithromax, and admitted.  Patient meets criteria for severe sepsis due to aspiration pneumonia.  Blood cultures NGTD.  Encephalopathy, respiratory symptoms and right lower extremity cellulitis/swelling improved.  Antibiotics discolored to p.o. Augmentin and Zithromax.  Therapy recommended SNF.  Medically stable for discharge.   Subjective: Seen and examined earlier this morning.  No major events overnight of this morning.  No complaints.  Objective: Vitals:   03/19/23 1651 03/19/23 2142 03/20/23 0602 03/20/23 0915  BP: (!) 155/62 (!) 137/52 (!) 153/59 (!) 155/66  Pulse: 78 78 78 79  Resp:  19 19 18   Temp: 98 F (36.7 C) 98.8 F (37.1 C) 98.1 F (36.7 C) 98 F (36.7 C)  TempSrc: Oral Oral Oral Oral  SpO2: 100% 97% 96% 98%  Weight:      Height:        Examination:  GENERAL: No apparent distress.  Nontoxic. HEENT: MMM.  Vision and hearing grossly intact.  NECK: Supple.  No apparent JVD.  RESP:  No IWOB.  Fair aeration bilaterally but limited exam due to body habitus. CVS:  RRR. Heart sounds normal.  ABD/GI/GU: BS+. Abd soft, NTND.  MSK/EXT: RLE swelling and erythema with warmth to  touch.  Still with right knee effusion.  Fair ROM. SKIN: As above NEURO: Awake, alert and oriented x 4 except date.  No apparent focal neuro deficit.  Right eye drooping likely chronic. PSYCH: Calm. Normal affect.   Procedures:  None  Microbiology summarized: COVID-19, influenza and RSV PCR nonreactive Blood cultures NGTD  Assessment and plan: Principal Problem:   Aspiration pneumonia (HCC) Active Problems:   Essential hypertension   Chronic kidney disease (CKD), stage III (moderate) (HCC)   History of pulmonary embolus (PE)   OSA (obstructive sleep apnea)   Morbid obesity (HCC)   Chronic systolic heart failure (HCC)   Paroxysmal atrial fibrillation (HCC)   S/P total knee arthroplasty, right   Pressure injury of skin   Severe sepsis (HCC)  Severe sepsis due to bibasilar aspiration pneumonia and RLE cellulitis: POA.  Had leukocytosis, tachypnea, respiratory failure and mental status change on presentation.  Concern for aspiration pneumonia versus CAP given mental status change and risk for aspiration.  He also have RLE cellulitis.  Sepsis physiology, respiratory status and mental status improved.  Blood cultures NGTD -IV CTX 10/3>>> p.o. Augmentin 10/5>> for 5 more days to complete total of 7 days course -Zithromax 10/3-10/5. -SLP recommended dysphagia 3 diet. -Aspiration precaution -Pulmonary toilet -PT/OT eval   Acute hypoxic respiratory failure secondary to the above: Improved.  Currently saturating in upper 90s on 2 L. -Wean oxygen as able -Intensity spirometry -Further management as above  Generalized weakness with fall: Reportedly  PROGRESS NOTE  Tanner [name] ZOX:[id number] DOB: 1945/09/10   PCP: Myrlene Broker, MD  Patient is from: Home.  Uses rolling walker at baseline.  DOA: 03/18/2023 LOS: 2  Chief complaints Chief Complaint  Patient presents with   Altered Mental Status     Brief Narrative / Interim history: 77 year old M with PMH of PE/PAF on Eliquis, HTN, HLD, HFmrEF, CKD-3A, OSA and recent right TKA by Dr. Charlann Boxer presented to ED with altered mental status, productive cough, myalgia and hypoxemia to 80s per EMS, and admitted for acute respiratory failure with hypoxia due to pneumonia.  CXR showed bibasilar opacities, right > left.  He also had significant right knee effusion with RLE swelling and erythema.  He was hemodynamically stable.  Lower extremity venous Doppler ordered.  Started on ceftriaxone and Zithromax, and admitted.  Patient meets criteria for severe sepsis due to aspiration pneumonia.  Blood cultures NGTD.  Encephalopathy, respiratory symptoms and right lower extremity cellulitis/swelling improved.  Antibiotics discolored to p.o. Augmentin and Zithromax.  Therapy recommended SNF.  Medically stable for discharge.   Subjective: Seen and examined earlier this morning.  No major events overnight of this morning.  No complaints.  Objective: Vitals:   03/19/23 1651 03/19/23 2142 03/20/23 0602 03/20/23 0915  BP: (!) 155/62 (!) 137/52 (!) 153/59 (!) 155/66  Pulse: 78 78 78 79  Resp:  19 19 18   Temp: 98 F (36.7 C) 98.8 F (37.1 C) 98.1 F (36.7 C) 98 F (36.7 C)  TempSrc: Oral Oral Oral Oral  SpO2: 100% 97% 96% 98%  Weight:      Height:        Examination:  GENERAL: No apparent distress.  Nontoxic. HEENT: MMM.  Vision and hearing grossly intact.  NECK: Supple.  No apparent JVD.  RESP:  No IWOB.  Fair aeration bilaterally but limited exam due to body habitus. CVS:  RRR. Heart sounds normal.  ABD/GI/GU: BS+. Abd soft, NTND.  MSK/EXT: RLE swelling and erythema with warmth to  touch.  Still with right knee effusion.  Fair ROM. SKIN: As above NEURO: Awake, alert and oriented x 4 except date.  No apparent focal neuro deficit.  Right eye drooping likely chronic. PSYCH: Calm. Normal affect.   Procedures:  None  Microbiology summarized: COVID-19, influenza and RSV PCR nonreactive Blood cultures NGTD  Assessment and plan: Principal Problem:   Aspiration pneumonia (HCC) Active Problems:   Essential hypertension   Chronic kidney disease (CKD), stage III (moderate) (HCC)   History of pulmonary embolus (PE)   OSA (obstructive sleep apnea)   Morbid obesity (HCC)   Chronic systolic heart failure (HCC)   Paroxysmal atrial fibrillation (HCC)   S/P total knee arthroplasty, right   Pressure injury of skin   Severe sepsis (HCC)  Severe sepsis due to bibasilar aspiration pneumonia and RLE cellulitis: POA.  Had leukocytosis, tachypnea, respiratory failure and mental status change on presentation.  Concern for aspiration pneumonia versus CAP given mental status change and risk for aspiration.  He also have RLE cellulitis.  Sepsis physiology, respiratory status and mental status improved.  Blood cultures NGTD -IV CTX 10/3>>> p.o. Augmentin 10/5>> for 5 more days to complete total of 7 days course -Zithromax 10/3-10/5. -SLP recommended dysphagia 3 diet. -Aspiration precaution -Pulmonary toilet -PT/OT eval   Acute hypoxic respiratory failure secondary to the above: Improved.  Currently saturating in upper 90s on 2 L. -Wean oxygen as able -Intensity spirometry -Further management as above  Generalized weakness with fall: Reportedly  PROGRESS NOTE  Tanner [name] ZOX:[id number] DOB: 1945/09/10   PCP: Myrlene Broker, MD  Patient is from: Home.  Uses rolling walker at baseline.  DOA: 03/18/2023 LOS: 2  Chief complaints Chief Complaint  Patient presents with   Altered Mental Status     Brief Narrative / Interim history: 77 year old M with PMH of PE/PAF on Eliquis, HTN, HLD, HFmrEF, CKD-3A, OSA and recent right TKA by Dr. Charlann Boxer presented to ED with altered mental status, productive cough, myalgia and hypoxemia to 80s per EMS, and admitted for acute respiratory failure with hypoxia due to pneumonia.  CXR showed bibasilar opacities, right > left.  He also had significant right knee effusion with RLE swelling and erythema.  He was hemodynamically stable.  Lower extremity venous Doppler ordered.  Started on ceftriaxone and Zithromax, and admitted.  Patient meets criteria for severe sepsis due to aspiration pneumonia.  Blood cultures NGTD.  Encephalopathy, respiratory symptoms and right lower extremity cellulitis/swelling improved.  Antibiotics discolored to p.o. Augmentin and Zithromax.  Therapy recommended SNF.  Medically stable for discharge.   Subjective: Seen and examined earlier this morning.  No major events overnight of this morning.  No complaints.  Objective: Vitals:   03/19/23 1651 03/19/23 2142 03/20/23 0602 03/20/23 0915  BP: (!) 155/62 (!) 137/52 (!) 153/59 (!) 155/66  Pulse: 78 78 78 79  Resp:  19 19 18   Temp: 98 F (36.7 C) 98.8 F (37.1 C) 98.1 F (36.7 C) 98 F (36.7 C)  TempSrc: Oral Oral Oral Oral  SpO2: 100% 97% 96% 98%  Weight:      Height:        Examination:  GENERAL: No apparent distress.  Nontoxic. HEENT: MMM.  Vision and hearing grossly intact.  NECK: Supple.  No apparent JVD.  RESP:  No IWOB.  Fair aeration bilaterally but limited exam due to body habitus. CVS:  RRR. Heart sounds normal.  ABD/GI/GU: BS+. Abd soft, NTND.  MSK/EXT: RLE swelling and erythema with warmth to  touch.  Still with right knee effusion.  Fair ROM. SKIN: As above NEURO: Awake, alert and oriented x 4 except date.  No apparent focal neuro deficit.  Right eye drooping likely chronic. PSYCH: Calm. Normal affect.   Procedures:  None  Microbiology summarized: COVID-19, influenza and RSV PCR nonreactive Blood cultures NGTD  Assessment and plan: Principal Problem:   Aspiration pneumonia (HCC) Active Problems:   Essential hypertension   Chronic kidney disease (CKD), stage III (moderate) (HCC)   History of pulmonary embolus (PE)   OSA (obstructive sleep apnea)   Morbid obesity (HCC)   Chronic systolic heart failure (HCC)   Paroxysmal atrial fibrillation (HCC)   S/P total knee arthroplasty, right   Pressure injury of skin   Severe sepsis (HCC)  Severe sepsis due to bibasilar aspiration pneumonia and RLE cellulitis: POA.  Had leukocytosis, tachypnea, respiratory failure and mental status change on presentation.  Concern for aspiration pneumonia versus CAP given mental status change and risk for aspiration.  He also have RLE cellulitis.  Sepsis physiology, respiratory status and mental status improved.  Blood cultures NGTD -IV CTX 10/3>>> p.o. Augmentin 10/5>> for 5 more days to complete total of 7 days course -Zithromax 10/3-10/5. -SLP recommended dysphagia 3 diet. -Aspiration precaution -Pulmonary toilet -PT/OT eval   Acute hypoxic respiratory failure secondary to the above: Improved.  Currently saturating in upper 90s on 2 L. -Wean oxygen as able -Intensity spirometry -Further management as above  Generalized weakness with fall: Reportedly  PROGRESS NOTE  Tanner [name] ZOX:[id number] DOB: 1945/09/10   PCP: Myrlene Broker, MD  Patient is from: Home.  Uses rolling walker at baseline.  DOA: 03/18/2023 LOS: 2  Chief complaints Chief Complaint  Patient presents with   Altered Mental Status     Brief Narrative / Interim history: 77 year old M with PMH of PE/PAF on Eliquis, HTN, HLD, HFmrEF, CKD-3A, OSA and recent right TKA by Dr. Charlann Boxer presented to ED with altered mental status, productive cough, myalgia and hypoxemia to 80s per EMS, and admitted for acute respiratory failure with hypoxia due to pneumonia.  CXR showed bibasilar opacities, right > left.  He also had significant right knee effusion with RLE swelling and erythema.  He was hemodynamically stable.  Lower extremity venous Doppler ordered.  Started on ceftriaxone and Zithromax, and admitted.  Patient meets criteria for severe sepsis due to aspiration pneumonia.  Blood cultures NGTD.  Encephalopathy, respiratory symptoms and right lower extremity cellulitis/swelling improved.  Antibiotics discolored to p.o. Augmentin and Zithromax.  Therapy recommended SNF.  Medically stable for discharge.   Subjective: Seen and examined earlier this morning.  No major events overnight of this morning.  No complaints.  Objective: Vitals:   03/19/23 1651 03/19/23 2142 03/20/23 0602 03/20/23 0915  BP: (!) 155/62 (!) 137/52 (!) 153/59 (!) 155/66  Pulse: 78 78 78 79  Resp:  19 19 18   Temp: 98 F (36.7 C) 98.8 F (37.1 C) 98.1 F (36.7 C) 98 F (36.7 C)  TempSrc: Oral Oral Oral Oral  SpO2: 100% 97% 96% 98%  Weight:      Height:        Examination:  GENERAL: No apparent distress.  Nontoxic. HEENT: MMM.  Vision and hearing grossly intact.  NECK: Supple.  No apparent JVD.  RESP:  No IWOB.  Fair aeration bilaterally but limited exam due to body habitus. CVS:  RRR. Heart sounds normal.  ABD/GI/GU: BS+. Abd soft, NTND.  MSK/EXT: RLE swelling and erythema with warmth to  touch.  Still with right knee effusion.  Fair ROM. SKIN: As above NEURO: Awake, alert and oriented x 4 except date.  No apparent focal neuro deficit.  Right eye drooping likely chronic. PSYCH: Calm. Normal affect.   Procedures:  None  Microbiology summarized: COVID-19, influenza and RSV PCR nonreactive Blood cultures NGTD  Assessment and plan: Principal Problem:   Aspiration pneumonia (HCC) Active Problems:   Essential hypertension   Chronic kidney disease (CKD), stage III (moderate) (HCC)   History of pulmonary embolus (PE)   OSA (obstructive sleep apnea)   Morbid obesity (HCC)   Chronic systolic heart failure (HCC)   Paroxysmal atrial fibrillation (HCC)   S/P total knee arthroplasty, right   Pressure injury of skin   Severe sepsis (HCC)  Severe sepsis due to bibasilar aspiration pneumonia and RLE cellulitis: POA.  Had leukocytosis, tachypnea, respiratory failure and mental status change on presentation.  Concern for aspiration pneumonia versus CAP given mental status change and risk for aspiration.  He also have RLE cellulitis.  Sepsis physiology, respiratory status and mental status improved.  Blood cultures NGTD -IV CTX 10/3>>> p.o. Augmentin 10/5>> for 5 more days to complete total of 7 days course -Zithromax 10/3-10/5. -SLP recommended dysphagia 3 diet. -Aspiration precaution -Pulmonary toilet -PT/OT eval   Acute hypoxic respiratory failure secondary to the above: Improved.  Currently saturating in upper 90s on 2 L. -Wean oxygen as able -Intensity spirometry -Further management as above  Generalized weakness with fall: Reportedly  PROGRESS NOTE  Tanner [name] ZOX:[id number] DOB: 1945/09/10   PCP: Myrlene Broker, MD  Patient is from: Home.  Uses rolling walker at baseline.  DOA: 03/18/2023 LOS: 2  Chief complaints Chief Complaint  Patient presents with   Altered Mental Status     Brief Narrative / Interim history: 77 year old M with PMH of PE/PAF on Eliquis, HTN, HLD, HFmrEF, CKD-3A, OSA and recent right TKA by Dr. Charlann Boxer presented to ED with altered mental status, productive cough, myalgia and hypoxemia to 80s per EMS, and admitted for acute respiratory failure with hypoxia due to pneumonia.  CXR showed bibasilar opacities, right > left.  He also had significant right knee effusion with RLE swelling and erythema.  He was hemodynamically stable.  Lower extremity venous Doppler ordered.  Started on ceftriaxone and Zithromax, and admitted.  Patient meets criteria for severe sepsis due to aspiration pneumonia.  Blood cultures NGTD.  Encephalopathy, respiratory symptoms and right lower extremity cellulitis/swelling improved.  Antibiotics discolored to p.o. Augmentin and Zithromax.  Therapy recommended SNF.  Medically stable for discharge.   Subjective: Seen and examined earlier this morning.  No major events overnight of this morning.  No complaints.  Objective: Vitals:   03/19/23 1651 03/19/23 2142 03/20/23 0602 03/20/23 0915  BP: (!) 155/62 (!) 137/52 (!) 153/59 (!) 155/66  Pulse: 78 78 78 79  Resp:  19 19 18   Temp: 98 F (36.7 C) 98.8 F (37.1 C) 98.1 F (36.7 C) 98 F (36.7 C)  TempSrc: Oral Oral Oral Oral  SpO2: 100% 97% 96% 98%  Weight:      Height:        Examination:  GENERAL: No apparent distress.  Nontoxic. HEENT: MMM.  Vision and hearing grossly intact.  NECK: Supple.  No apparent JVD.  RESP:  No IWOB.  Fair aeration bilaterally but limited exam due to body habitus. CVS:  RRR. Heart sounds normal.  ABD/GI/GU: BS+. Abd soft, NTND.  MSK/EXT: RLE swelling and erythema with warmth to  touch.  Still with right knee effusion.  Fair ROM. SKIN: As above NEURO: Awake, alert and oriented x 4 except date.  No apparent focal neuro deficit.  Right eye drooping likely chronic. PSYCH: Calm. Normal affect.   Procedures:  None  Microbiology summarized: COVID-19, influenza and RSV PCR nonreactive Blood cultures NGTD  Assessment and plan: Principal Problem:   Aspiration pneumonia (HCC) Active Problems:   Essential hypertension   Chronic kidney disease (CKD), stage III (moderate) (HCC)   History of pulmonary embolus (PE)   OSA (obstructive sleep apnea)   Morbid obesity (HCC)   Chronic systolic heart failure (HCC)   Paroxysmal atrial fibrillation (HCC)   S/P total knee arthroplasty, right   Pressure injury of skin   Severe sepsis (HCC)  Severe sepsis due to bibasilar aspiration pneumonia and RLE cellulitis: POA.  Had leukocytosis, tachypnea, respiratory failure and mental status change on presentation.  Concern for aspiration pneumonia versus CAP given mental status change and risk for aspiration.  He also have RLE cellulitis.  Sepsis physiology, respiratory status and mental status improved.  Blood cultures NGTD -IV CTX 10/3>>> p.o. Augmentin 10/5>> for 5 more days to complete total of 7 days course -Zithromax 10/3-10/5. -SLP recommended dysphagia 3 diet. -Aspiration precaution -Pulmonary toilet -PT/OT eval   Acute hypoxic respiratory failure secondary to the above: Improved.  Currently saturating in upper 90s on 2 L. -Wean oxygen as able -Intensity spirometry -Further management as above  Generalized weakness with fall: Reportedly

## 2023-03-20 NOTE — NC FL2 (Signed)
[place] MEDICAID FL2 LEVEL OF CARE FORM     IDENTIFICATION  Patient Name: Tanner [name] Birthdate: 1946/03/13 Sex: male Admission Date (Current Location): 03/18/2023  [facility] and [place] Number:  Producer, television/film/video and Address:  [facility], [address], [place], [place] [address]      Provider Number: [id number]  Attending Physician Name and Address:  Almon Hercules, MD  Relative Name and Phone Number:       Current Level of Care: Hospital Recommended Level of Care: Skilled Nursing Facility Prior Approval Number:    Date Approved/Denied:   PASRR Number: [id number]  Discharge Plan: SNF    Current Diagnoses: Patient Active Problem List   Diagnosis Date Noted   Aspiration pneumonia (HCC) 03/18/2023   Pressure injury of skin 03/18/2023   Severe sepsis (HCC) 03/18/2023   S/P total knee arthroplasty, right 12/15/2022   Acute respiratory failure with hypoxemia (HCC) 10/22/2022   Paroxysmal atrial fibrillation (HCC) 08/19/2022   Jaw pain 06/24/2022   Left hand pain 05/29/2022   Chronic systolic heart failure (HCC) 02/23/2022   Moderate mitral regurgitation 02/23/2022   Leg swelling 01/29/2022   ED (erectile dysfunction) 01/13/2021   Cyst, dermoid, scalp and neck 01/13/2021   Chronic kidney disease (CKD), stage III (moderate) (HCC) 12/13/2019   Carpal tunnel syndrome 12/05/2019   Elevated creatine kinase 11/15/2019   Aortic atherosclerosis (HCC) 11/15/2019   Recurrent Clostridium difficile diarrhea 05/28/2017   Peripheral neuropathy 03/17/2017   Diabetes (HCC) 12/11/2016   Allergic rhinitis 12/11/2016   Degenerative arthritis of left knee 01/30/2016   Morbid obesity (HCC) 01/30/2016   Routine general medical examination at a health care facility 04/20/2014   History of pulmonary embolus (PE) 12/19/2013   Anxiety state 03/29/2007   Depression 03/29/2007   OSA (obstructive sleep apnea) 03/29/2007   Unspecified glaucoma 03/29/2007    BPH (benign prostatic hyperplasia) 03/29/2007   OA (osteoarthritis) of knee 03/29/2007   Essential hypertension 03/29/2007   History of Bell's palsy 03/29/2007    Orientation RESPIRATION BLADDER Height & Weight     Self, Place  O2 (1L nasal cannula) Incontinent, External catheter Weight: 231 lb 14.8 oz (105.2 kg) Height:  5' 5.5" (166.4 cm)  BEHAVIORAL SYMPTOMS/MOOD NEUROLOGICAL BOWEL NUTRITION STATUS      Incontinent Diet (see dc summary)  AMBULATORY STATUS COMMUNICATION OF NEEDS Skin   Extensive Assist Verbally PU Stage and Appropriate Care, Other (Comment) (MASD on abdomen, breast, sacrum; Stage II on buttocks;)                       Personal Care Assistance Level of Assistance  Bathing, Feeding, Dressing Bathing Assistance: Maximum assistance Feeding assistance: Limited assistance Dressing Assistance: Maximum assistance     Functional Limitations Info             SPECIAL CARE FACTORS FREQUENCY  PT (By licensed PT), OT (By licensed OT)     PT Frequency: 5x/week OT Frequency: 5x/week            Contractures Contractures Info: Not present    Additional Factors Info  Code Status, Allergies, Psychotropic Code Status Info: Full Allergies Info: Lisinopril, Amlodipine, Codeine Sulfate Psychotropic Info: Wellbutrin         Current Medications (03/20/2023):  This is the current hospital active medication list Current Facility-Administered Medications  Medication Dose Route Frequency Provider Last Rate Last Admin   acetaminophen (TYLENOL) tablet 650 mg  650 mg Oral Q6H  PRN Darlin Drop, DO   650 mg at 03/18/23 1610   amiodarone (PACERONE) tablet 200 mg  200 mg Oral Daily Candelaria Stagers T, MD   200 mg at 03/20/23 0726   amoxicillin-clavulanate (AUGMENTIN) 875-125 MG per tablet 1 tablet  1 tablet Oral Q12H Almon Hercules, MD       apixaban (ELIQUIS) tablet 5 mg  5 mg Oral BID Dow Adolph N, DO   5 mg at 03/20/23 9604   atorvastatin (LIPITOR) tablet 80 mg  80 mg  Oral Daily Candelaria Stagers T, MD   80 mg at 03/20/23 0726   azithromycin (ZITHROMAX) tablet 500 mg  500 mg Oral Daily Rexford Maus, RPH   500 mg at 03/19/23 2252   buPROPion (WELLBUTRIN SR) 12 hr tablet 150 mg  150 mg Oral BID Candelaria Stagers T, MD   150 mg at 03/20/23 0727   carvedilol (COREG) tablet 3.125 mg  3.125 mg Oral BID WC Dow Adolph N, DO   3.125 mg at 03/20/23 0726   finasteride (PROSCAR) tablet 5 mg  5 mg Oral QHS Gonfa, Taye T, MD   5 mg at 03/19/23 2252   guaiFENesin (ROBITUSSIN) 100 MG/5ML liquid 5 mL  5 mL Oral Q4H PRN Dow Adolph N, DO   5 mL at 03/18/23 0857   polyethylene glycol (MIRALAX / GLYCOLAX) packet 17 g  17 g Oral Daily PRN Darlin Drop, DO         Discharge Medications: Please see discharge summary for a list of discharge medications.  Relevant Imaging Results:  Relevant Lab Results:   Additional Information SSN: [id number]. Use [employer] Medicare Benefit.  Mearl Latin, LCSW

## 2023-03-20 NOTE — TOC Initial Note (Addendum)
Transition of Care [facility]) - Initial/Assessment Note    Patient Details  Name: Tanner [name] MRN: [id number] Date of Birth: 22-Mar-1946  Transition of [facility]) CM/SW Contact:    Mearl Latin, LCSW Phone Number: 03/20/2023, 9:16 AM  Clinical Narrative:                 CSW received consult for possible SNF placement at time of discharge. CSW spoke with patient's son, Luisa Hart, due to patient's documented Ox2 status. Patient's son reported that patent lives alone and son is currently unable to care for patient at home given patient's current physical needs and fall risk. Patient has a cane and walker at home; has never used CPAP. Patient's son expressed understanding of PT recommendation and is agreeable to SNF placement at time of discharge. He reports preference for [facility] [place] as he knows the [facility] and trusts them. CSW discussed insurance authorization process and how we will likely use his [employer] Medicare Benefit as the [employer] SNFs are out of town and often difficult to get into. CSW will provide Medicare SNF ratings list. CSW will send out referrals for review and provide bed offers as available. CSW also went over long term needs and how to apply for Medicaid if needed.   Skilled Nursing Rehab Facilities-   [email or web address]   Ratings out of 5 stars (5 the highest)   Name Address  Phone # Quality Care Staffing Health Inspection Overall  [facility] [address] [phone] 2 2 5 5   [facility] [address], [place] [phone] 3 2 4 4   [facility] [address], [place] [phone] 2 2 2 2   [facility] [address] [place] [phone] 4 2 4 4   [facility]  [address], [place] [phone] 3 3 5 5   [facility] [address] [facility] [phone] 1 2 2 1   [facility] [address] [place] [phone] 4 1 2 1   [facility] [address], [place] [phone] 1 3 3 2   [address] ([facility]) [address], [place] [phone] 2 2 2 2   [facility] [address] [place], [place] [phone] 2 2 1 1   [facility] ([place]) [address], [place] [phone] 4 1 1 1   Eligha Bridegroom [address] Liliane [place] [phone] 4 3 4 4   [facility] [address], [place] [phone] 3 4 3 3           [facility] [address] [place] [phone] 4 2 1 1   [facility], [place] [address], [place] [phone] 1 1 2 1   [facility] [address], [place] [phone] 2 1 4 3   [facility] [address] [phone] 2 1 4 3   [facility] [address], [place] [phone] 2 3 3 3           [address] (no [facility]) [address], [place] [phone] 4 5 5 5   [facility] (No [employer]) [name] [place] [phone] 1 2 4 3   [facility] [address], [place] [phone] 2 1 2 1   [facility] (No [employer]) [address], [place] [phone] 4 5 5 5   [facility] [address], [facility] [phone] 3 3 2 2   [facility] [address], [place] [phone] 2 1 1 1   Hannah Beat [address] [place], [place] [phone] 5  2 5 5   [facility]  [address] [place] [phone] 2 1 2 1   [facility] [address] [place] [phone] 4 1 1 1   [facility] [address] [place], [place] [phone] 2 2 3 3           [facility] [address] [place] [phone] 1 1 1 1   Renelda Mom [address] [place] Clines  [phone] 2 4 3 3   [facility] (No Humana) [address], [place] [phone] 2 2 4 4   [facility]) [address], [place] [phone] 2 1 1 1   [facility] [address] [place] [phone] 4 3 5 5   [facility] [place] [address], [place]  [phone] 1 1 1 1           [facility] [address] [place], [place] [phone] 5 4 5 5   [facility])  [address] [place] [phone] 1 1 2 1   [facility]) [address], [place] [phone]  2 4 4   [facility] [place] [address], [place] [phone] 3 5 5 5   [address] [place], [place] [phone] 4 2 2 2   [facility]) [address]  [phone] 3 1 3 2      Expected Discharge Plan: Skilled Nursing Facility Barriers to Discharge: Continued Medical Work up, English as a second language teacher, SNF Pending bed offer   Patient Goals and CMS Choice Patient states their goals for this hospitalization and ongoing recovery are:: Rehab CMS Medicare.gov Compare Post Acute Care list provided to:: Patient Represenative (must comment) Choice offered to / list presented to : Adult Children Wise ownership interest in [facility].provided to:: Adult Children    Expected Discharge Plan and Services In-house Referral: Clinical Social Work   Post Acute Care Choice: Skilled Nursing Facility Living arrangements for the past 2 months: Single Family Home                                      Prior Living Arrangements/Services Living arrangements for the past 2 months: Single Family Home Lives with:: Self Patient language and need for interpreter reviewed:: Yes Do you feel safe going back to the place where you live?: Yes      Need for Family Participation in Patient Care: Yes (Comment) Care giver support system in place?: Yes (comment) Current home services: DME (walker, cane) Criminal Activity/Legal Involvement Pertinent to Current Situation/Hospitalization: No - Comment as needed  Activities of Daily Living      Permission Sought/Granted Permission sought to share information with : Facility Medical sales representative, Family Supports Permission granted to share  information with : Yes, Verbal Permission Granted  Share Information with NAME: Luisa Hart  Permission granted to share info w AGENCY: SNFs  Permission granted to share info w Relationship: Son  Permission granted to share info w Contact Information: [phone]  Emotional Assessment Appearance:: Appears stated age Attitude/Demeanor/Rapport: Unable to Assess Affect (typically observed): Unable to Assess Orientation: : Oriented to Self, Oriented to Place Alcohol / Substance Use: Not Applicable Psych Involvement: No (comment)  Admission diagnosis:  Hypoxia [R09.02] CAP (community acquired pneumonia) [J18.9] Altered mental status, unspecified altered mental status type [R41.82] Community acquired pneumonia, unspecified laterality [J18.9] Sepsis without acute organ dysfunction, due to unspecified organism [facility]) [A41.9] Patient Active Problem List   Diagnosis Date Noted  Aspiration pneumonia (HCC) 03/18/2023   Pressure injury of skin 03/18/2023   Severe sepsis (HCC) 03/18/2023   S/P total knee arthroplasty, right 12/15/2022   Acute respiratory failure with hypoxemia (HCC) 10/22/2022   Paroxysmal atrial fibrillation (HCC) 08/19/2022   Jaw pain 06/24/2022   Left hand pain 05/29/2022   Chronic systolic heart failure (HCC) 02/23/2022   Moderate mitral regurgitation 02/23/2022   Leg swelling 01/29/2022   ED (erectile dysfunction) 01/13/2021   Cyst, dermoid, scalp and neck 01/13/2021   Chronic kidney disease (CKD), stage III (moderate) (HCC) 12/13/2019   Carpal tunnel syndrome 12/05/2019   Elevated creatine kinase 11/15/2019   Aortic atherosclerosis (HCC) 11/15/2019   Recurrent Clostridium difficile diarrhea 05/28/2017   Peripheral neuropathy 03/17/2017   Diabetes (HCC) 12/11/2016   Allergic rhinitis 12/11/2016   Degenerative arthritis of left knee 01/30/2016   Morbid obesity (HCC) 01/30/2016   Routine general medical examination at a health care facility 04/20/2014   History of  pulmonary embolus (PE) 12/19/2013   Anxiety state 03/29/2007   Depression 03/29/2007   OSA (obstructive sleep apnea) 03/29/2007   Unspecified glaucoma 03/29/2007   BPH (benign prostatic hyperplasia) 03/29/2007   OA (osteoarthritis) of knee 03/29/2007   Essential hypertension 03/29/2007   History of Bell's palsy 03/29/2007   PCP:  Myrlene Broker, MD Pharmacy:   [facility] [address], [place] - [address] [place] [address] Phone: [phone] Fax: [phone]  [facility] [address] [place] [address] Phone: [phone] Fax: [phone]  [id number] [place], [place] - [address] AT [facility] [address] [place] [address] Phone: [phone] Fax: [phone]     Social Determinants of Health (SDOH) Social History: SDOH Screenings   Food Insecurity: No Food Insecurity (12/15/2022)  Housing: Low Risk  (12/15/2022)  Transportation Needs: No Transportation Needs (12/15/2022)  Utilities: Not At Risk (12/15/2022)  Alcohol Screen: Low Risk  (10/27/2022)  Depression (PHQ2-9): Low Risk  (01/04/2023)  Financial Resource Strain: Low Risk  (10/27/2022)  Physical Activity: Inactive (10/27/2022)  Social Connections: Moderately Integrated (10/27/2022)  Stress: No Stress Concern Present (12/31/2022)  Recent Concern: Stress - Stress Concern Present (10/30/2022)  Tobacco Use: Medium Risk (03/18/2023)   SDOH Interventions:     Readmission Risk Interventions    08/20/2022   11:10 AM  Readmission Risk Prevention Plan  Transportation Screening Complete  PCP or Specialist Appt within 3-5 Days Complete  HRI or Home Care Consult Complete  Palliative Care Screening Not Applicable  Medication Review (RN Care Manager) Complete

## 2023-03-21 DIAGNOSIS — N1831 Chronic kidney disease, stage 3a: Secondary | ICD-10-CM | POA: Diagnosis not present

## 2023-03-21 DIAGNOSIS — Z86711 Personal history of pulmonary embolism: Secondary | ICD-10-CM | POA: Diagnosis not present

## 2023-03-21 DIAGNOSIS — I10 Essential (primary) hypertension: Secondary | ICD-10-CM | POA: Diagnosis not present

## 2023-03-21 DIAGNOSIS — J690 Pneumonitis due to inhalation of food and vomit: Secondary | ICD-10-CM | POA: Diagnosis not present

## 2023-03-21 LAB — GLUCOSE, CAPILLARY: Glucose-Capillary: 120 mg/dL — ABNORMAL HIGH (ref 70–99)

## 2023-03-21 LAB — CBC
HCT: 35.7 % — ABNORMAL LOW (ref 39.0–52.0)
Hemoglobin: 11.1 g/dL — ABNORMAL LOW (ref 13.0–17.0)
MCH: 25 pg — ABNORMAL LOW (ref 26.0–34.0)
MCHC: 31.1 g/dL (ref 30.0–36.0)
MCV: 80.4 fL (ref 80.0–100.0)
Platelets: 197 10*3/uL (ref 150–400)
RBC: 4.44 MIL/uL (ref 4.22–5.81)
RDW: 17.2 % — ABNORMAL HIGH (ref 11.5–15.5)
WBC: 10.2 10*3/uL (ref 4.0–10.5)
nRBC: 0 % (ref 0.0–0.2)

## 2023-03-21 LAB — RENAL FUNCTION PANEL
Albumin: 2.4 g/dL — ABNORMAL LOW (ref 3.5–5.0)
Anion gap: 17 — ABNORMAL HIGH (ref 5–15)
BUN: 22 mg/dL (ref 8–23)
CO2: 27 mmol/L (ref 22–32)
Calcium: 8.9 mg/dL (ref 8.9–10.3)
Chloride: 97 mmol/L — ABNORMAL LOW (ref 98–111)
Creatinine, Ser: 1.76 mg/dL — ABNORMAL HIGH (ref 0.61–1.24)
GFR, Estimated: 39 mL/min — ABNORMAL LOW (ref >60)
Glucose, Bld: 119 mg/dL — ABNORMAL HIGH (ref 70–99)
Phosphorus: 3.6 mg/dL (ref 2.5–4.6)
Potassium: 3.9 mmol/L (ref 3.5–5.1)
Sodium: 141 mmol/L (ref 135–145)

## 2023-03-21 LAB — MAGNESIUM: Magnesium: 2 mg/dL (ref 1.7–2.4)

## 2023-03-21 MED ORDER — ORAL CARE MOUTH RINSE: 15.0000 mL | OROMUCOSAL | Status: DC | PRN

## 2023-03-21 MED ORDER — EMPAGLIFLOZIN 25 MG PO TABS
25.0000 mg | ORAL_TABLET | Freq: Every day | ORAL | Status: DC
  Administered 2023-03-21 – 2023-03-26 (×6): 25 mg via ORAL
  Filled 2023-03-21 (×7): qty 1

## 2023-03-21 MED ORDER — ORAL CARE MOUTH RINSE
15.0000 mL | OROMUCOSAL | Status: DC
  Administered 2023-03-21 – 2023-03-26 (×20): 15 mL via OROMUCOSAL

## 2023-03-21 NOTE — Plan of Care (Signed)
  Problem: Education: Goal: Knowledge of the prescribed therapeutic regimen will improve Outcome: Progressing Goal: Individualized Educational Video(s) Outcome: Progressing   Problem: Activity: Goal: Ability to avoid complications of mobility impairment will improve Outcome: Progressing Goal: Range of joint motion will improve Outcome: Progressing   Problem: Clinical Measurements: Goal: Postoperative complications will be avoided or minimized Outcome: Progressing   Problem: Pain Management: Goal: Pain level will decrease with appropriate interventions Outcome: Progressing   Problem: Skin Integrity: Goal: Will show signs of wound healing Outcome: Progressing   Problem: Education: Goal: Ability to describe self-care measures that may prevent or decrease complications (Diabetes Survival Skills Education) will improve Outcome: Progressing Goal: Individualized Educational Video(s) Outcome: Progressing   Problem: Coping: Goal: Ability to adjust to condition or change in health will improve Outcome: Progressing   Problem: Fluid Volume: Goal: Ability to maintain a balanced intake and output will improve Outcome: Progressing   Problem: Health Behavior/Discharge Planning: Goal: Ability to identify and utilize available resources and services will improve Outcome: Progressing Goal: Ability to manage health-related needs will improve Outcome: Progressing   Problem: Metabolic: Goal: Ability to maintain appropriate glucose levels will improve Outcome: Progressing   Problem: Nutritional: Goal: Maintenance of adequate nutrition will improve Outcome: Progressing Goal: Progress toward achieving an optimal weight will improve Outcome: Progressing   Problem: Skin Integrity: Goal: Risk for impaired skin integrity will decrease Outcome: Progressing   Problem: Tissue Perfusion: Goal: Adequacy of tissue perfusion will improve Outcome: Progressing

## 2023-03-21 NOTE — Progress Notes (Signed)
PROGRESS NOTE  Tanner [name] Tanner [name] DOB: Apr 17, 1946   PCP: Myrlene Broker, MD  Patient is from: Home.  Uses rolling walker at baseline.  DOA: 03/18/2023 LOS: 3  Chief complaints Chief Complaint  Patient presents with   Altered Mental Status     Brief Narrative / Interim history: 77 year old M with PMH of PE/PAF on Eliquis, HTN, HLD, HFmrEF, CKD-3A, OSA and recent right TKA by Dr. Charlann Boxer presented to ED with altered mental status, productive cough, myalgia and hypoxemia to 80s per EMS, and admitted for acute respiratory failure with hypoxia due to pneumonia.  CXR showed bibasilar opacities, right > left.  He also had significant right knee effusion with RLE swelling and erythema.  He was hemodynamically stable.  Lower extremity venous Doppler ordered.  Started on ceftriaxone and Zithromax, and admitted.  Patient meets criteria for severe sepsis due to aspiration pneumonia.  Blood cultures NGTD.  Encephalopathy, respiratory symptoms and right lower extremity cellulitis/swelling improved.  Antibiotics de-escalated to p.o. Augmentin and Zithromax.  Therapy recommended SNF.  Medically stable for discharge.   Subjective: Seen and examined earlier this morning.  No major events overnight of this morning.  No complaints. Objective: Vitals:   03/20/23 0915 03/20/23 2012 03/21/23 0405 03/21/23 0938  BP: (!) 155/66 (!) 160/72 (!) 172/66 (!) 168/60  Pulse: 79 77 80 75  Resp: 18  18 18   Temp: 98 F (36.7 C) 98.2 F (36.8 C) 98.5 F (36.9 C) 98.2 F (36.8 C)  TempSrc: Oral  Oral   SpO2: 98% 100% 94% 91%  Weight:      Height:        Examination:  GENERAL: No apparent distress.  Nontoxic. HEENT: MMM.  Vision and hearing grossly intact.  NECK: Supple.  No apparent JVD.  RESP:  No IWOB.  Fair aeration bilaterally but limited exam due to body habitus. CVS:  RRR. Heart sounds normal.  ABD/GI/GU: BS+. Abd soft, NTND.  MSK/EXT: RLE swelling, erythema and warmth improved (see  picture under media).  Right knee effusion.  Fair ROM. SKIN: As above NEURO: Awake, alert and oriented x 4.  No apparent focal neuro deficit.  Right eye drooping likely chronic. PSYCH: Calm. Normal affect.   Procedures:  None  Microbiology summarized: COVID-19, influenza and RSV PCR nonreactive Blood cultures NGTD  Assessment and plan: Principal Problem:   Aspiration pneumonia (HCC) Active Problems:   Essential hypertension   Chronic kidney disease (CKD), stage III (moderate) (HCC)   History of pulmonary embolus (PE)   OSA (obstructive sleep apnea)   Morbid obesity (HCC)   Chronic systolic heart failure (HCC)   Paroxysmal atrial fibrillation (HCC)   S/P total knee arthroplasty, right   Pressure injury of skin   Severe sepsis (HCC)  Severe sepsis due to bibasilar aspiration pneumonia and RLE cellulitis: POA.  Had leukocytosis, tachypnea, respiratory failure and mental status change on presentation.  Concern for aspiration pneumonia versus CAP given mental status change and risk for aspiration.  He also have RLE cellulitis.  Sepsis physiology, respiratory status and mental status improved.  Blood cultures NGTD -IV CTX 10/3>>> p.o. Augmentin 10/5>> for 5 more days to complete total of 7 days course -Zithromax 10/3-10/5. -Continue diuretics for leg swelling. -SLP recommended dysphagia 3 diet. -Aspiration precaution -Pulmonary toilet -PT/OT-recommended SNF.  TOC working on placement.   Acute hypoxic respiratory failure secondary to the above: Resolved.  Currently on room air. -Intensity spirometry -Further management as above  Generalized weakness with fall: Reportedly fell  1 week ago.  No apparent trauma but significant right knee effusion. RLE edema/cellulitis post fall: Right knee x-ray showed large effusion.  LE venous Doppler negative for DVT. History of right TKA in 12/2022 by Dr. Charlann Boxer Large right knee effusion -Orthopedic surgery consulted and did not feel he needs  aspiration.  Recommended outpatient follow-up with Dr. Charlann Boxer -Antibiotics as above -Fall precaution -PT/OT   Acute metabolic encephalopathy: Multifactorial including severe sepsis, respiratory failure and polypharmacy.     No focal neurodeficit on exam.  UDS, ammonia, B12, VBG, TSH and CT head unrevealing.  Resolved. -Treat treatable causes -Reorientation and delirium precaution -Discontinued sedating medication  Chronic HFmrEF: TTE in 08/2022 with LVEF of 45 to 50%, GH, indeterminate DD, and normal RV SF.  Appears euvolemic except for right lower extremity swelling.  -Continue diuretics for leg swelling. -Resume home Jardiance. -Strict intake and output, daily weights, renal functions and electrolytes   DM-2 with hyperglycemia and hypoglycemia: A1c 4.8% in 11/2022.  On Jardiance.  No longer on Ozempic. -Discontinued CBG monitoring and SSI. -Recheck hemoglobin A1c  Paroxysmal A-fib: Rate controlled. -Continue home Coreg, amiodarone and Eliquis -Optimize electrolytes  OSA not on CPAP.  VBG reassuring.  CKD-3B: Stable Recent Labs    08/20/22 0045 09/01/22 1126 10/01/22 1606 12/02/22 0902 12/16/22 0335 03/18/23 0207 03/18/23 0842 03/19/23 0758 03/20/23 1115 03/21/23 0635  BUN 25* 22 21 19 22 18 18 20 17 22   CREATININE 1.81* 1.76* 1.81* 1.87* 1.56* 1.62* 1.61* 1.71* 1.46* 1.76*  -Continue monitoring   Hypertension: BP slightly elevated. -Continue home Coreg -Diuretics and Jardiance  Morbid obesity: Elevated BMI with comorbidity as above Body mass index is 38.01 kg/m.  Pressure skin injury: POA Pressure Injury 03/18/23 Buttocks Left;Right Stage 2 -  Partial thickness loss of dermis presenting as a shallow open injury with a red, pink wound bed without slough. (Active)  03/18/23 0629  Location: Buttocks  Location Orientation: Left;Right  Staging: Stage 2 -  Partial thickness loss of dermis presenting as a shallow open injury with a red, pink wound bed without slough.   Wound Description (Comments):   Present on Admission: Yes  Dressing Type Foam - Lift dressing to assess site every shift 03/20/23 1930   DVT prophylaxis:   apixaban (ELIQUIS) tablet 5 mg  Code Status: Full code Family Communication: None at baseline Level of care: Med-Surg Status is: Inpatient Remains inpatient appropriate because: Severe sepsis due to aspiration pneumonia and right lower extremity cellulitis   Final disposition: SNF.  Medically stable for discharge. Consultants:  Orthopedic surgery  35 minutes with more than 50% spent in reviewing records, counseling patient/family and coordinating care.   Sch Meds:  Scheduled Meds:  amiodarone  200 mg Oral Daily   amoxicillin-clavulanate  1 tablet Oral Q12H   apixaban  5 mg Oral BID   atorvastatin  80 mg Oral Daily   buPROPion  150 mg Oral BID   carvedilol  6.25 mg Oral BID WC   empagliflozin  25 mg Oral Daily   finasteride  5 mg Oral QHS   mouth rinse  15 mL Mouth Rinse 4 times per day   torsemide  40 mg Oral Daily   Continuous Infusions:   PRN Meds:.acetaminophen, guaiFENesin, mouth rinse, polyethylene glycol  Antimicrobials: Anti-infectives (From admission, onward)    Start     Dose/Rate Route Frequency Ordered Stop   03/20/23 2200  amoxicillin-clavulanate (AUGMENTIN) 875-125 MG per tablet 1 tablet        1 tablet  Oral Every 12 hours 03/20/23 1014 03/25/23 0959   03/20/23 1000  amoxicillin-clavulanate (AUGMENTIN) 875-125 MG per tablet 1 tablet  Status:  Discontinued        1 tablet Oral Every 12 hours 03/20/23 0710 03/20/23 1014   03/19/23 2200  azithromycin (ZITHROMAX) tablet 500 mg  Status:  Discontinued        500 mg Oral Daily 03/19/23 1026 03/20/23 1015   03/18/23 2200  azithromycin (ZITHROMAX) 500 mg in sodium chloride 0.9 % 250 mL IVPB  Status:  Discontinued        500 mg 250 mL/hr over 60 Minutes Intravenous Every 24 hours 03/18/23 0543 03/19/23 1026   03/18/23 1000  cefTRIAXone (ROCEPHIN) 2 g in  sodium chloride 0.9 % 100 mL IVPB  Status:  Discontinued        2 g 200 mL/hr over 30 Minutes Intravenous Every 24 hours 03/18/23 0543 03/20/23 0710   03/18/23 0230  cefTRIAXone (ROCEPHIN) 1 g in sodium chloride 0.9 % 100 mL IVPB        1 g 200 mL/hr over 30 Minutes Intravenous  Once 03/18/23 0228 03/18/23 0327   03/18/23 0230  azithromycin (ZITHROMAX) 500 mg in sodium chloride 0.9 % 250 mL IVPB        500 mg 250 mL/hr over 60 Minutes Intravenous  Once 03/18/23 0228 03/18/23 0402        I have personally reviewed the following labs and images: CBC: Recent Labs  Lab 03/18/23 0207 03/18/23 0211 03/18/23 0842 03/19/23 0758 03/20/23 1115 03/21/23 0635  WBC 13.6*  --  10.7* 10.7* 10.4 10.2  NEUTROABS 10.8*  --   --   --   --   --   HGB 13.0 15.3 11.1* 10.8* 11.4* 11.1*  HCT 42.8 45.0 37.1* 37.3* 38.8* 35.7*  MCV 80.3  --  80.0 83.4 82.6 80.4  PLT 197  --  161 155 234 197   BMP &GFR Recent Labs  Lab 03/18/23 0207 03/18/23 0211 03/18/23 0842 03/19/23 0758 03/20/23 1115 03/21/23 0635  NA 137 139 136 137 136 141  K 4.1 4.1 4.1 4.2 4.3 3.9  CL 101  --  101 104 101 97*  CO2 23  --  21* 23 25 27   GLUCOSE 80  --  124* 87 97 119*  BUN 18  --  18 20 17 22   CREATININE 1.62*  --  1.61* 1.71* 1.46* 1.76*  CALCIUM 9.1  --  8.5* 8.6* 8.5* 8.9  MG  --   --  1.9 2.1 2.2 2.0  PHOS  --   --  3.0  --  3.4 3.6   Estimated Creatinine Clearance: 39 mL/min (A) (by C-G formula based on SCr of 1.76 mg/dL (H)). Liver & Pancreas: Recent Labs  Lab 03/18/23 0207 03/18/23 0842 03/20/23 1115 03/21/23 0635  AST 15  --   --   --   ALT 16  --   --   --   ALKPHOS 85  --   --   --   BILITOT 1.2  --   --   --   PROT 6.8  --   --   --   ALBUMIN 3.4* 2.6* 2.5* 2.4*   No results for input(s): "LIPASE", "AMYLASE" in the last 168 hours. Recent Labs  Lab 03/18/23 1831  AMMONIA 19   Diabetic: No results for input(s): "HGBA1C" in the last 72 hours. Recent Labs  Lab 03/18/23 2023  03/19/23 0736 03/19/23 1132 03/19/23 1653  03/20/23 0713  GLUCAP 97 76 98 87 89   Cardiac Enzymes: No results for input(s): "CKTOTAL", "CKMB", "CKMBINDEX", "TROPONINI" in the last 168 hours. No results for input(s): "PROBNP" in the last 8760 hours. Coagulation Profile: Recent Labs  Lab 03/18/23 0325  INR 1.5*   Thyroid Function Tests: No results for input(s): "TSH", "T4TOTAL", "FREET4", "T3FREE", "THYROIDAB" in the last 72 hours.  Lipid Profile: No results for input(s): "CHOL", "HDL", "LDLCALC", "TRIG", "CHOLHDL", "LDLDIRECT" in the last 72 hours. Anemia Panel: Recent Labs    03/18/23 1831  VITAMINB12 362   Urine analysis:    Component Value Date/Time   COLORURINE YELLOW 03/18/2023 2213   APPEARANCEUR CLEAR 03/18/2023 2213   LABSPEC 1.017 03/18/2023 2213   PHURINE 5.0 03/18/2023 2213   GLUCOSEU >=500 (A) 03/18/2023 2213   GLUCOSEU NEGATIVE 09/13/2007 1028   HGBUR SMALL (A) 03/18/2023 2213   BILIRUBINUR NEGATIVE 03/18/2023 2213   BILIRUBINUR negative 04/19/2015 1417   BILIRUBINUR neg 02/12/2013 1449   KETONESUR NEGATIVE 03/18/2023 2213   PROTEINUR 100 (A) 03/18/2023 2213   UROBILINOGEN 0.2 04/19/2015 1417   UROBILINOGEN 0.2 mg/dL 16/03/9603 5409   NITRITE NEGATIVE 03/18/2023 2213   LEUKOCYTESUR SMALL (A) 03/18/2023 2213   Sepsis Labs: Invalid input(s): "PROCALCITONIN", "LACTICIDVEN"  Microbiology: Recent Results (from the past 240 hour(s))  Blood Culture (routine x 2)     Status: None (Preliminary result)   Collection Time: 03/18/23  3:22 AM   Specimen: BLOOD RIGHT HAND  Result Value Ref Range Status   Specimen Description BLOOD RIGHT HAND  Final   Special Requests   Final    BOTTLES DRAWN AEROBIC AND ANAEROBIC Blood Culture results may not be optimal due to an inadequate volume of blood received in culture bottles   Culture   Final    NO GROWTH 3 DAYS Performed at [facility], [address] [place], [place] [address]    Report Status PENDING   Incomplete  Resp panel by RT-PCR (RSV, Flu A&B, Covid) Anterior Nasal Swab     Status: None   Collection Time: 03/18/23  3:32 AM   Specimen: Anterior Nasal Swab  Result Value Ref Range Status   SARS Coronavirus 2 by RT PCR NEGATIVE NEGATIVE Final   Influenza A by PCR NEGATIVE NEGATIVE Final   Influenza B by PCR NEGATIVE NEGATIVE Final    Comment: (NOTE) The Xpert Xpress SARS-CoV-2/FLU/RSV plus assay is intended as an aid in the diagnosis of influenza from Nasopharyngeal swab specimens and should not be used as a sole basis for treatment. Nasal washings and aspirates are unacceptable for Xpert Xpress SARS-CoV-2/FLU/RSV testing.  Fact Sheet for Patients: BloggerCourse.com  Fact Sheet for Healthcare Providers: SeriousBroker.it  This test is not yet approved or cleared by the [place] FDA and has been authorized for detection and/or diagnosis of SARS-CoV-2 by FDA under an Emergency Use Authorization (EUA). This EUA will remain in effect (meaning this test can be used) for the duration of the COVID-19 declaration under Section 564(b)(1) of the Act, 21 U.S.C. section 360bbb-3(b)(1), unless the authorization is terminated or revoked.     Resp Syncytial Virus by PCR NEGATIVE NEGATIVE Final    Comment: (NOTE) Fact Sheet for Patients: BloggerCourse.com  Fact Sheet for Healthcare Providers: SeriousBroker.it  This test is not yet approved or cleared by the [place] FDA and has been authorized for detection and/or diagnosis of SARS-CoV-2 by FDA under an Emergency Use Authorization (EUA). This EUA will remain in effect (meaning this test  can be used) for the duration of the COVID-19 declaration under Section 564(b)(1) of the Act, 21 U.S.C. section 360bbb-3(b)(1), unless the authorization is terminated or revoked.  Performed at [facility], [address]  [place], [place] [address]   Blood Culture (routine x 2)     Status: None (Preliminary result)   Collection Time: 03/18/23  8:42 AM   Specimen: BLOOD LEFT HAND  Result Value Ref Range Status   Specimen Description BLOOD LEFT HAND  Final   Special Requests   Final    BOTTLES DRAWN AEROBIC AND ANAEROBIC Blood Culture results may not be optimal due to an inadequate volume of blood received in culture bottles   Culture   Final    NO GROWTH 3 DAYS Performed at [facility], [address], [place], [place] [address]    Report Status PENDING  Incomplete    Radiology Studies: No results found.    Leilanni Halvorson T. Zamari Bonsall [facility]  If 7PM-7AM, please contact night-coverage [email or web address] 03/21/2023, 12:01 PM

## 2023-03-22 DIAGNOSIS — I10 Essential (primary) hypertension: Secondary | ICD-10-CM | POA: Diagnosis not present

## 2023-03-22 DIAGNOSIS — J690 Pneumonitis due to inhalation of food and vomit: Secondary | ICD-10-CM | POA: Diagnosis not present

## 2023-03-22 DIAGNOSIS — N1831 Chronic kidney disease, stage 3a: Secondary | ICD-10-CM | POA: Diagnosis not present

## 2023-03-22 DIAGNOSIS — Z86711 Personal history of pulmonary embolism: Secondary | ICD-10-CM | POA: Diagnosis not present

## 2023-03-22 LAB — RENAL FUNCTION PANEL
Albumin: 2.6 g/dL — ABNORMAL LOW (ref 3.5–5.0)
Anion gap: 15 (ref 5–15)
BUN: 27 mg/dL — ABNORMAL HIGH (ref 8–23)
CO2: 25 mmol/L (ref 22–32)
Calcium: 8.5 mg/dL — ABNORMAL LOW (ref 8.9–10.3)
Chloride: 96 mmol/L — ABNORMAL LOW (ref 98–111)
Creatinine, Ser: 1.79 mg/dL — ABNORMAL HIGH (ref 0.61–1.24)
GFR, Estimated: 38 mL/min — ABNORMAL LOW (ref >60)
Glucose, Bld: 154 mg/dL — ABNORMAL HIGH (ref 70–99)
Phosphorus: 3.7 mg/dL (ref 2.5–4.6)
Potassium: 3.9 mmol/L (ref 3.5–5.1)
Sodium: 136 mmol/L (ref 135–145)

## 2023-03-22 LAB — HEMOGLOBIN A1C
Hgb A1c MFr Bld: 5.3 % (ref 4.8–5.6)
Mean Plasma Glucose: 105.41 mg/dL

## 2023-03-22 LAB — CBC
HCT: 41.1 % (ref 39.0–52.0)
Hemoglobin: 12.3 g/dL — ABNORMAL LOW (ref 13.0–17.0)
MCH: 23.8 pg — ABNORMAL LOW (ref 26.0–34.0)
MCHC: 29.9 g/dL — ABNORMAL LOW (ref 30.0–36.0)
MCV: 79.5 fL — ABNORMAL LOW (ref 80.0–100.0)
Platelets: 243 10*3/uL (ref 150–400)
RBC: 5.17 MIL/uL (ref 4.22–5.81)
RDW: 17.2 % — ABNORMAL HIGH (ref 11.5–15.5)
WBC: 12.3 10*3/uL — ABNORMAL HIGH (ref 4.0–10.5)
nRBC: 0 % (ref 0.0–0.2)

## 2023-03-22 LAB — MAGNESIUM: Magnesium: 2.3 mg/dL (ref 1.7–2.4)

## 2023-03-22 MED ORDER — ORAL CARE MOUTH RINSE: 15.0000 mL | OROMUCOSAL | Status: DC | PRN

## 2023-03-22 MED ORDER — TORSEMIDE 20 MG PO TABS
20.0000 mg | ORAL_TABLET | Freq: Every day | ORAL | Status: DC
  Administered 2023-03-23: 20 mg via ORAL
  Filled 2023-03-22: qty 1

## 2023-03-22 NOTE — Progress Notes (Signed)
PROGRESS NOTE  Tanner [name] XBJ:[id number] DOB: 26-Feb-1946   PCP: Myrlene Broker, MD  Patient is from: Home.  Uses rolling walker at baseline.  DOA: 03/18/2023 LOS: 4  Chief complaints Chief Complaint  Patient presents with   Altered Mental Status     Brief Narrative / Interim history: 77 year old M with PMH of PE/PAF on Eliquis, HTN, HLD, HFmrEF, CKD-3A, OSA and recent right TKA by Dr. Charlann Boxer presented to ED with altered mental status, productive cough, myalgia and hypoxemia to 80s per EMS, and admitted for acute respiratory failure with hypoxia due to pneumonia.  CXR showed bibasilar opacities, right > left.  He also had significant right knee effusion with RLE swelling and erythema.  He was hemodynamically stable.  Lower extremity venous Doppler ordered.  Started on ceftriaxone and Zithromax, and admitted.  Patient meets criteria for severe sepsis due to aspiration pneumonia.  Blood cultures NGTD.  Encephalopathy, respiratory symptoms and right lower extremity cellulitis/swelling improved.  Antibiotics de-escalated to p.o. Augmentin and Zithromax.  Therapy recommended SNF.  Medically stable for discharge.   Subjective: Seen and examined earlier this morning.  No major events overnight of this morning.  No complaints. Objective: Vitals:   03/21/23 1633 03/21/23 2032 03/22/23 0631 03/22/23 0842  BP: (!) 144/66 (!) 166/64 (!) 156/70 (!) 147/57  Pulse: 70 71 69 78  Resp: 18 18 17 20   Temp: 98.1 F (36.7 C) 98.1 F (36.7 C) 98.3 F (36.8 C) 98.3 F (36.8 C)  TempSrc:  Oral Oral Oral  SpO2: 92% 91% 96% 96%  Weight:      Height:        Examination:  GENERAL: No apparent distress.  Nontoxic. HEENT: MMM.  Vision and hearing grossly intact.  NECK: Supple.  No apparent JVD.  RESP:  No IWOB.  Fair aeration bilaterally but limited exam due to body habitus. CVS:  RRR. Heart sounds normal.  ABD/GI/GU: BS+. Abd soft, NTND.  MSK/EXT: RLE swelling, erythema and warmth improved  (see picture under media).  Right knee effusion.  Fair ROM. SKIN: As above NEURO: Awake, alert and oriented x 4.  No apparent focal neuro deficit.  Right eye drooping likely chronic. PSYCH: Calm. Normal affect.   Procedures:  None  Microbiology summarized: COVID-19, influenza and RSV PCR nonreactive Blood cultures NGTD  Assessment and plan: Principal Problem:   Aspiration pneumonia (HCC) Active Problems:   Essential hypertension   Chronic kidney disease (CKD), stage III (moderate) (HCC)   History of pulmonary embolus (PE)   OSA (obstructive sleep apnea)   Morbid obesity (HCC)   Chronic systolic heart failure (HCC)   Paroxysmal atrial fibrillation (HCC)   S/P total knee arthroplasty, right   Pressure injury of skin   Severe sepsis (HCC)  Severe sepsis due to bibasilar aspiration pneumonia and RLE cellulitis: POA.  Had leukocytosis, tachypnea, respiratory failure and mental status change on presentation.  Concern for aspiration pneumonia versus CAP given mental status change and risk for aspiration.  He also have RLE cellulitis.  Sepsis physiology, respiratory status and mental status improved.  Blood cultures NGTD -IV CTX 10/3>>> p.o. Augmentin 10/5>> for 5 more days to complete total of 7 days course -Zithromax 10/3-10/5. -Continue diuretics for leg swelling. -SLP recommended dysphagia 3 diet. -Mild leukocytosis partly from hemoconcentration. -Aspiration precaution -Pulmonary toilet -PT/OT-recommended SNF.  TOC working on placement.   Acute hypoxic respiratory failure secondary to the above: Resolved.  Currently on room air. -Intensity spirometry -Further management as above  Generalized  weakness with fall: Reportedly fell 1 week ago.  No apparent trauma but significant right knee effusion. RLE edema/cellulitis post fall: Right knee x-ray showed large effusion.  LE venous Doppler negative for DVT. History of right TKA in 12/2022 by Dr. Charlann Boxer Large right knee  effusion -Orthopedic surgery consulted and did not feel he needs aspiration.  Recommended outpatient follow-up with Dr. Charlann Boxer -Antibiotics as above -Fall precaution -PT/OT   Acute metabolic encephalopathy: Multifactorial including severe sepsis, respiratory failure and polypharmacy.     No focal neurodeficit on exam.  UDS, ammonia, B12, VBG, TSH and CT head unrevealing.  Resolved. -Treat treatable causes -Reorientation and delirium precaution -Discontinued sedating medication  Chronic HFmrEF: TTE in 08/2022 with LVEF of 45 to 50%, GH, indeterminate DD, and normal RV SF.  Appears euvolemic except for right lower extremity swelling that has significantly improved with torsemide..  -Decrease torsemide to 20 mg daily.  Has already received 40 mg this morning. -Continue home Jardiance. -Strict intake and output, daily weights, renal functions and electrolytes   DM-2 with hyperglycemia and hypoglycemia: A1c 4.8% in 11/2022.  On Jardiance.  No longer on Ozempic. -Discontinued CBG monitoring and SSI. -Follow hemoglobin A1c  Paroxysmal A-fib: Rate controlled. -Continue home Coreg, amiodarone and Eliquis -Optimize electrolytes  OSA not on CPAP.  VBG reassuring.  CKD-3B: Stable Recent Labs    09/01/22 1126 10/01/22 1606 12/02/22 0902 12/16/22 0335 03/18/23 0207 03/18/23 0842 03/19/23 0758 03/20/23 1115 03/21/23 0635 03/22/23 0921  BUN 22 21 19 22 18 18 20 17 22  27*  CREATININE 1.76* 1.81* 1.87* 1.56* 1.62* 1.61* 1.71* 1.46* 1.76* 1.79*  -Continue monitoring   Hypertension: BP improved. -Continue home Coreg -Diuretics and Jardiance as above.  Morbid obesity: Elevated BMI with comorbidity as above Body mass index is 38.01 kg/m.  Pressure skin injury: POA Pressure Injury 03/18/23 Buttocks Left;Right Stage 2 -  Partial thickness loss of dermis presenting as a shallow open injury with a red, pink wound bed without slough. (Active)  03/18/23 0629  Location: Buttocks  Location  Orientation: Left;Right  Staging: Stage 2 -  Partial thickness loss of dermis presenting as a shallow open injury with a red, pink wound bed without slough.  Wound Description (Comments):   Present on Admission: Yes  Dressing Type Foam - Lift dressing to assess site every shift 03/21/23 2032   DVT prophylaxis:   apixaban (ELIQUIS) tablet 5 mg  Code Status: Full code Family Communication: None at baseline Level of care: Med-Surg Status is: Inpatient Remains inpatient appropriate because: Severe sepsis due to aspiration pneumonia and right lower extremity cellulitis   Final disposition: SNF.  Medically stable for discharge. Consultants:  Orthopedic surgery  35 minutes with more than 50% spent in reviewing records, counseling patient/family and coordinating care.   Sch Meds:  Scheduled Meds:  amiodarone  200 mg Oral Daily   amoxicillin-clavulanate  1 tablet Oral Q12H   apixaban  5 mg Oral BID   atorvastatin  80 mg Oral Daily   buPROPion  150 mg Oral BID   carvedilol  6.25 mg Oral BID WC   empagliflozin  25 mg Oral Daily   finasteride  5 mg Oral QHS   mouth rinse  15 mL Mouth Rinse 4 times per day   torsemide  40 mg Oral Daily   Continuous Infusions:   PRN Meds:.acetaminophen, guaiFENesin, mouth rinse, mouth rinse, polyethylene glycol  Antimicrobials: Anti-infectives (From admission, onward)    Start     Dose/Rate Route Frequency Ordered  Stop   03/20/23 2200  amoxicillin-clavulanate (AUGMENTIN) 875-125 MG per tablet 1 tablet        1 tablet Oral Every 12 hours 03/20/23 1014 03/25/23 0959   03/20/23 1000  amoxicillin-clavulanate (AUGMENTIN) 875-125 MG per tablet 1 tablet  Status:  Discontinued        1 tablet Oral Every 12 hours 03/20/23 0710 03/20/23 1014   03/19/23 2200  azithromycin (ZITHROMAX) tablet 500 mg  Status:  Discontinued        500 mg Oral Daily 03/19/23 1026 03/20/23 1015   03/18/23 2200  azithromycin (ZITHROMAX) 500 mg in sodium chloride 0.9 % 250 mL IVPB   Status:  Discontinued        500 mg 250 mL/hr over 60 Minutes Intravenous Every 24 hours 03/18/23 0543 03/19/23 1026   03/18/23 1000  cefTRIAXone (ROCEPHIN) 2 g in sodium chloride 0.9 % 100 mL IVPB  Status:  Discontinued        2 g 200 mL/hr over 30 Minutes Intravenous Every 24 hours 03/18/23 0543 03/20/23 0710   03/18/23 0230  cefTRIAXone (ROCEPHIN) 1 g in sodium chloride 0.9 % 100 mL IVPB        1 g 200 mL/hr over 30 Minutes Intravenous  Once 03/18/23 0228 03/18/23 0327   03/18/23 0230  azithromycin (ZITHROMAX) 500 mg in sodium chloride 0.9 % 250 mL IVPB        500 mg 250 mL/hr over 60 Minutes Intravenous  Once 03/18/23 0228 03/18/23 0402        I have personally reviewed the following labs and images: CBC: Recent Labs  Lab 03/18/23 0207 03/18/23 0211 03/18/23 0842 03/19/23 0758 03/20/23 1115 03/21/23 0635 03/22/23 0921  WBC 13.6*  --  10.7* 10.7* 10.4 10.2 12.3*  NEUTROABS 10.8*  --   --   --   --   --   --   HGB 13.0   < > 11.1* 10.8* 11.4* 11.1* 12.3*  HCT 42.8   < > 37.1* 37.3* 38.8* 35.7* 41.1  MCV 80.3  --  80.0 83.4 82.6 80.4 79.5*  PLT 197  --  161 155 234 197 243   < > = values in this interval not displayed.   BMP &GFR Recent Labs  Lab 03/18/23 0842 03/19/23 0758 03/20/23 1115 03/21/23 0635 03/22/23 0921  NA 136 137 136 141 136  K 4.1 4.2 4.3 3.9 3.9  CL 101 104 101 97* 96*  CO2 21* 23 25 27 25   GLUCOSE 124* 87 97 119* 154*  BUN 18 20 17 22  27*  CREATININE 1.61* 1.71* 1.46* 1.76* 1.79*  CALCIUM 8.5* 8.6* 8.5* 8.9 8.5*  MG 1.9 2.1 2.2 2.0 2.3  PHOS 3.0  --  3.4 3.6 3.7   Estimated Creatinine Clearance: 38.3 mL/min (A) (by C-G formula based on SCr of 1.79 mg/dL (H)). Liver & Pancreas: Recent Labs  Lab 03/18/23 0207 03/18/23 8295 03/20/23 1115 03/21/23 0635 03/22/23 0921  AST 15  --   --   --   --   ALT 16  --   --   --   --   ALKPHOS 85  --   --   --   --   BILITOT 1.2  --   --   --   --   PROT 6.8  --   --   --   --   ALBUMIN 3.4* 2.6*  2.5* 2.4* 2.6*   No results for input(s): "LIPASE", "AMYLASE" in the last  168 hours. Recent Labs  Lab 03/18/23 1831  AMMONIA 19   Diabetic: No results for input(s): "HGBA1C" in the last 72 hours. Recent Labs  Lab 03/19/23 0736 03/19/23 1132 03/19/23 1653 03/20/23 0713 03/21/23 2057  GLUCAP 76 98 87 89 120*   Cardiac Enzymes: No results for input(s): "CKTOTAL", "CKMB", "CKMBINDEX", "TROPONINI" in the last 168 hours. No results for input(s): "PROBNP" in the last 8760 hours. Coagulation Profile: Recent Labs  Lab 03/18/23 0325  INR 1.5*   Thyroid Function Tests: No results for input(s): "TSH", "T4TOTAL", "FREET4", "T3FREE", "THYROIDAB" in the last 72 hours.  Lipid Profile: No results for input(s): "CHOL", "HDL", "LDLCALC", "TRIG", "CHOLHDL", "LDLDIRECT" in the last 72 hours. Anemia Panel: No results for input(s): "VITAMINB12", "FOLATE", "FERRITIN", "TIBC", "IRON", "RETICCTPCT" in the last 72 hours.  Urine analysis:    Component Value Date/Time   COLORURINE YELLOW 03/18/2023 2213   APPEARANCEUR CLEAR 03/18/2023 2213   LABSPEC 1.017 03/18/2023 2213   PHURINE 5.0 03/18/2023 2213   GLUCOSEU >=500 (A) 03/18/2023 2213   GLUCOSEU NEGATIVE 09/13/2007 1028   HGBUR SMALL (A) 03/18/2023 2213   BILIRUBINUR NEGATIVE 03/18/2023 2213   BILIRUBINUR negative 04/19/2015 1417   BILIRUBINUR neg 02/12/2013 1449   KETONESUR NEGATIVE 03/18/2023 2213   PROTEINUR 100 (A) 03/18/2023 2213   UROBILINOGEN 0.2 04/19/2015 1417   UROBILINOGEN 0.2 mg/dL 86/57/8469 6295   NITRITE NEGATIVE 03/18/2023 2213   LEUKOCYTESUR SMALL (A) 03/18/2023 2213   Sepsis Labs: Invalid input(s): "PROCALCITONIN", "LACTICIDVEN"  Microbiology: Recent Results (from the past 240 hour(s))  Blood Culture (routine x 2)     Status: None (Preliminary result)   Collection Time: 03/18/23  3:22 AM   Specimen: BLOOD RIGHT HAND  Result Value Ref Range Status   Specimen Description BLOOD RIGHT HAND  Final   Special  Requests   Final    BOTTLES DRAWN AEROBIC AND ANAEROBIC Blood Culture results may not be optimal due to an inadequate volume of blood received in culture bottles   Culture   Final    NO GROWTH 4 DAYS Performed at [facility], [address], [place], [place] [address]    Report Status PENDING  Incomplete  Resp panel by RT-PCR (RSV, Flu A&B, Covid) Anterior Nasal Swab     Status: None   Collection Time: 03/18/23  3:32 AM   Specimen: Anterior Nasal Swab  Result Value Ref Range Status   SARS Coronavirus 2 by RT PCR NEGATIVE NEGATIVE Final   Influenza A by PCR NEGATIVE NEGATIVE Final   Influenza B by PCR NEGATIVE NEGATIVE Final    Comment: (NOTE) The Xpert Xpress SARS-CoV-2/FLU/RSV plus assay is intended as an aid in the diagnosis of influenza from Nasopharyngeal swab specimens and should not be used as a sole basis for treatment. Nasal washings and aspirates are unacceptable for Xpert Xpress SARS-CoV-2/FLU/RSV testing.  Fact Sheet for Patients: BloggerCourse.com  Fact Sheet for Healthcare Providers: SeriousBroker.it  This test is not yet approved or cleared by the [place] FDA and has been authorized for detection and/or diagnosis of SARS-CoV-2 by FDA under an Emergency Use Authorization (EUA). This EUA will remain in effect (meaning this test can be used) for the duration of the COVID-19 declaration under Section 564(b)(1) of the Act, 21 U.S.C. section 360bbb-3(b)(1), unless the authorization is terminated or revoked.     Resp Syncytial Virus by PCR NEGATIVE NEGATIVE Final    Comment: (NOTE) Fact Sheet for Patients: BloggerCourse.com  Fact Sheet for Healthcare Providers: SeriousBroker.it  This test is not yet approved or cleared by the [place] and has been authorized for detection and/or diagnosis of SARS-CoV-2 by FDA under an Emergency Use Authorization  (EUA). This EUA will remain in effect (meaning this test can be used) for the duration of the COVID-19 declaration under Section 564(b)(1) of the Act, 21 U.S.C. section 360bbb-3(b)(1), unless the authorization is terminated or revoked.  Performed at [facility], [address], [place], [place] [address]   Blood Culture (routine x 2)     Status: None (Preliminary result)   Collection Time: 03/18/23  8:42 AM   Specimen: BLOOD LEFT HAND  Result Value Ref Range Status   Specimen Description BLOOD LEFT HAND  Final   Special Requests   Final    BOTTLES DRAWN AEROBIC AND ANAEROBIC Blood Culture results may not be optimal due to an inadequate volume of blood received in culture bottles   Culture   Final    NO GROWTH 4 DAYS Performed at [facility], [address], [place], [place] [address]    Report Status PENDING  Incomplete    Radiology Studies: No results found.    Flynn Gwyn T. Garlen Reinig [facility]  If 7PM-7AM, please contact night-coverage [email or web address] 03/22/2023, 12:50 PM

## 2023-03-22 NOTE — Progress Notes (Signed)
Speech Language Pathology Treatment: Dysphagia  Patient Details Name: Tanner [name] MRN: [id number] DOB: 05/07/1946 Today's Date: 03/22/2023 Time: 8295-6213 SLP Time Calculation (min) (ACUTE ONLY): 15 min  Assessment / Plan / Recommendation Clinical Impression  Pt seen for dysphagia therapy. SLP assisted pt in donning dentures. He was unable to recall chin tuck with liquids when initially asked but when given initial moderate verbal and visual cues fading to supervision he was able to perform. SLP placed a sign on wall for him to refer to. No cough with liquids however he did cough after first bite of solid texture possibly due to particles spilling prematurely. Mastication was mildly delayed and requested liquid wash but encouraged to try to swallow before having liquids to reduce risk of premature spill with water. He was able to clear his oral cavity. He also needed verbal reminders to clear throat/hard cough intermittently. Recommend pt continue Dys 3 texture, thin liquid with supervision to recall strategies, tuck chin with liquid, throat clear/cough intermittently throughout meals. ST will continue    HPI HPI: Tanner "Tanner [name]" ALOK [name] is a 77 yo male presenting to ED with AMS, productive cough, and body aches. Found to be hypoxic, which was improved with placement of 4L [place]. CXR with bibasilar airspace opacities, R>L, which may be reflective of atelectasis or PNA. PMH includes paroxysmal A-fib on Eliquis, essential HTN, HLD, HFpEF, CKD 3A      SLP Plan  Continue with current plan of care      Recommendations for follow up therapy are one component of a multi-disciplinary discharge planning process, led by the attending physician.  Recommendations may be updated based on patient status, additional functional criteria and insurance authorization.    Recommendations  Diet recommendations: Dysphagia 3 (mechanical soft);Thin liquid Liquids provided via: Cup;Straw Medication Administration:  Crushed with puree Supervision: Intermittent supervision to cue for compensatory strategies;Staff to assist with self feeding Compensations: Minimize environmental distractions;Slow rate;Small sips/bites;Monitor for anterior loss;Multiple dry swallows after each bite/sip;Clear throat intermittently;Hard cough after swallow;Chin tuck (chin tuck with liquids) Postural Changes and/or Swallow Maneuvers: Seated upright 90 degrees;Upright 30-60 min after meal                  Oral care BID   Frequent or constant Supervision/Assistance Dysphagia, oropharyngeal phase (R13.12)     Continue with current plan of care     Tanner [name]  03/22/2023, 10:30 AM

## 2023-03-22 NOTE — Progress Notes (Signed)
   03/22/23 2016  BiPAP/CPAP/SIPAP  Reason BIPAP/CPAP not in use Non-compliant   Pt. Refused doesn't use a cpap at home

## 2023-03-22 NOTE — Progress Notes (Signed)
Physical Therapy Treatment Patient Details Name: Tanner [name] MRN: [id number] DOB: 03/28/1946 Today's Date: 03/22/2023   History of Present Illness 77 yo male presents to [facility] on 10/3 with AMS, fall, SOB. R knee xray shows large effusion. Pt with sepsis due to bibasilar aspiration PNA with associated respiratory failure, RLE cellulitis. PMH includes R TKR 12/15/2022, L TKR 2022, anxiety, BPH, HF, depression, DMII, DVT, HTN, PE, afib, CKDIII.    PT Comments  Patient is agreeable to PT session. He continues to require physical assistance for bed mobility and transfers. Three bouts of standing performed with rest breaks required between standing bouts due to fatigue. Standing tolerance of less than 20 seconds with external support required to maintain standing balance, posterior lean. The patient is not at his baseline level of functional independence. Anticipate patient will need continued rehabilitation after this hospital stay, <3 hours/day. PT will continue to follow.    If plan is discharge home, recommend the following: Two people to help with walking and/or transfers;Two people to help with bathing/dressing/bathroom   Can travel by private vehicle     Yes  Equipment Recommendations  None recommended by PT    Recommendations for Other Services       Precautions / Restrictions Precautions Precautions: Fall Restrictions Weight Bearing Restrictions: No     Mobility  Bed Mobility Overal bed mobility: Needs Assistance Bed Mobility: Supine to Sit     Supine to sit: Mod assist Sit to supine: Max assist, HOB elevated, Used rails   General bed mobility comments: assistance for trunk and BLE support. cues for technique. increased time and effort with increased support required to return to bed    Transfers Overall transfer level: Needs assistance Equipment used: Rolling walker (2 wheels) Transfers: Sit to/from Stand Sit to Stand: Max assist, From elevated surface            General transfer comment: 3 bouts of standing performed with Max A required with each standing bout. verbal cues for hand placement and technique to facilitate independence with standing.    Ambulation/Gait               General Gait Details: not attempted due to poor standing tolerance, generalized weakness. recommend to have second person for safety for any ambulation attempts   Stairs             Wheelchair Mobility     Tilt Bed    Modified Rankin (Stroke Patients Only)       Balance Overall balance assessment: Needs assistance Sitting-balance support: Feet supported Sitting balance-Leahy Scale: Fair   Postural control: Posterior lean Standing balance support: Bilateral upper extremity supported Standing balance-Leahy Scale: Poor Standing balance comment: external support  requierd with heavy reliance on rolling walker. standing tolerance of less than 20 seconds                            Cognition Arousal: Alert Behavior During Therapy: WFL for tasks assessed/performed Overall Cognitive Status: Impaired/Different from baseline                                 General Comments: patient is able to follow single step commands with increased time        Exercises      General Comments General comments (skin integrity, edema, etc.): patient fatigues quickly with activity and needs rest breaks between  bouts of standing      Pertinent Vitals/Pain Pain Assessment Pain Assessment: No/denies pain    Home Living                          Prior Function            PT Goals (current goals can now be found in the care plan section) Acute Rehab PT Goals Patient Stated Goal: to feel stronger PT Goal Formulation: With patient Time For Goal Achievement: 04/01/23 Potential to Achieve Goals: Fair Progress towards PT goals: Progressing toward goals    Frequency    Min 1X/week      PT Plan      Co-evaluation               AM-PAC PT "6 Clicks" Mobility   Outcome Measure  Help needed turning from your back to your side while in a flat bed without using bedrails?: A Lot Help needed moving from lying on your back to sitting on the side of a flat bed without using bedrails?: A Lot Help needed moving to and from a bed to a chair (including a wheelchair)?: Total Help needed standing up from a chair using your arms (e.g., wheelchair or bedside chair)?: A Lot Help needed to walk in hospital room?: Total Help needed climbing 3-5 steps with a railing? : Total 6 Click Score: 9    End of Session Equipment Utilized During Treatment: Gait belt Activity Tolerance: Patient tolerated treatment well Patient left: in bed;with call bell/phone within reach;with bed alarm set   PT Visit Diagnosis: Other abnormalities of gait and mobility (R26.89);Muscle weakness (generalized) (M62.81)     Time: 0950-1002 PT Time Calculation (min) (ACUTE ONLY): 12 min  Charges:    $Therapeutic Activity: 8-22 mins PT General Charges $$ ACUTE PT VISIT: 1 Visit                     Donna Bernard, PT, MPT   Ina Homes 03/22/2023, 12:26 PM

## 2023-03-22 NOTE — TOC Progression Note (Addendum)
Transition of Care [facility]) - Progression Note    Patient Details  Name: Tanner [name] MRN: [id number] Date of Birth: 22-Mar-1946  [facility] CM/SW Contact  Erin Sons, [place] Phone Number: 03/22/2023, 1:43 PM  Clinical Narrative:     Pt's family is requesting [facility] for SNF. CSW reached out to [facility] and informed they will review review referral.   1230: [facility] is requesting more PT notes; CSW sent recent PT notes in hub.   1345: [facility] notified CSW they can offer bed. TOC to initiate SNF authorization request.   1610: Auth pending. Pt's son is updated  Expected Discharge Plan: Skilled Nursing Facility Barriers to Discharge: Insurance Authorization  Expected Discharge Plan and Services In-house Referral: Clinical Social Work   Post Acute Care Choice: Skilled Nursing Facility Living arrangements for the past 2 months: Single Family Home                                       Social Determinants of Health (SDOH) Interventions SDOH Screenings   Food Insecurity: No Food Insecurity (12/15/2022)  Housing: Low Risk  (12/15/2022)  Transportation Needs: No Transportation Needs (12/15/2022)  Utilities: Not At Risk (12/15/2022)  Alcohol Screen: Low Risk  (10/27/2022)  Depression (PHQ2-9): Low Risk  (01/04/2023)  Financial Resource Strain: Low Risk  (10/27/2022)  Physical Activity: Inactive (10/27/2022)  Social Connections: Moderately Integrated (10/27/2022)  Stress: No Stress Concern Present (12/31/2022)  Recent Concern: Stress - Stress Concern Present (10/30/2022)  Tobacco Use: Medium Risk (03/18/2023)    Readmission Risk Interventions    08/20/2022   11:10 AM  Readmission Risk Prevention Plan  Transportation Screening Complete  PCP or Specialist Appt within 3-5 Days Complete  HRI or Home Care Consult Complete  Palliative Care Screening Not Applicable  Medication Review (RN Care Manager) Complete

## 2023-03-23 DIAGNOSIS — N1832 Chronic kidney disease, stage 3b: Secondary | ICD-10-CM | POA: Diagnosis not present

## 2023-03-23 DIAGNOSIS — J690 Pneumonitis due to inhalation of food and vomit: Secondary | ICD-10-CM | POA: Diagnosis not present

## 2023-03-23 DIAGNOSIS — I10 Essential (primary) hypertension: Secondary | ICD-10-CM | POA: Diagnosis not present

## 2023-03-23 DIAGNOSIS — I5022 Chronic systolic (congestive) heart failure: Secondary | ICD-10-CM | POA: Diagnosis not present

## 2023-03-23 LAB — RENAL FUNCTION PANEL
Albumin: 2.4 g/dL — ABNORMAL LOW (ref 3.5–5.0)
Albumin: 2.6 g/dL — ABNORMAL LOW (ref 3.5–5.0)
Anion gap: 13 (ref 5–15)
Anion gap: 18 — ABNORMAL HIGH (ref 5–15)
BUN: 29 mg/dL — ABNORMAL HIGH (ref 8–23)
BUN: 32 mg/dL — ABNORMAL HIGH (ref 8–23)
CO2: 32 mmol/L (ref 22–32)
CO2: 31 mmol/L (ref 22–32)
Calcium: 8.6 mg/dL — ABNORMAL LOW (ref 8.9–10.3)
Calcium: 8.9 mg/dL (ref 8.9–10.3)
Chloride: 95 mmol/L — ABNORMAL LOW (ref 98–111)
Chloride: 90 mmol/L — ABNORMAL LOW (ref 98–111)
Creatinine, Ser: 1.82 mg/dL — ABNORMAL HIGH (ref 0.61–1.24)
Creatinine, Ser: 1.92 mg/dL — ABNORMAL HIGH (ref 0.61–1.24)
GFR, Estimated: 38 mL/min — ABNORMAL LOW (ref >60)
GFR, Estimated: 35 mL/min — ABNORMAL LOW (ref >60)
Glucose, Bld: 106 mg/dL — ABNORMAL HIGH (ref 70–99)
Glucose, Bld: 123 mg/dL — ABNORMAL HIGH (ref 70–99)
Phosphorus: 4.1 mg/dL (ref 2.5–4.6)
Phosphorus: 3.7 mg/dL (ref 2.5–4.6)
Potassium: 3 mmol/L — ABNORMAL LOW (ref 3.5–5.1)
Potassium: 3.5 mmol/L (ref 3.5–5.1)
Sodium: 140 mmol/L (ref 135–145)
Sodium: 139 mmol/L (ref 135–145)

## 2023-03-23 LAB — CULTURE, BLOOD (ROUTINE X 2)
Culture: NO GROWTH
Culture: NO GROWTH

## 2023-03-23 LAB — CBC
HCT: 41.3 % (ref 39.0–52.0)
Hemoglobin: 12.4 g/dL — ABNORMAL LOW (ref 13.0–17.0)
MCH: 23.6 pg — ABNORMAL LOW (ref 26.0–34.0)
MCHC: 30 g/dL (ref 30.0–36.0)
MCV: 78.5 fL — ABNORMAL LOW (ref 80.0–100.0)
Platelets: 265 10*3/uL (ref 150–400)
RBC: 5.26 MIL/uL (ref 4.22–5.81)
RDW: 17.2 % — ABNORMAL HIGH (ref 11.5–15.5)
WBC: 11.8 10*3/uL — ABNORMAL HIGH (ref 4.0–10.5)
nRBC: 0 % (ref 0.0–0.2)

## 2023-03-23 LAB — PROCALCITONIN: Procalcitonin: 0.22 ng/mL

## 2023-03-23 LAB — MAGNESIUM: Magnesium: 2.2 mg/dL (ref 1.7–2.4)

## 2023-03-23 MED ORDER — POLYETHYLENE GLYCOL 3350 17 GM/SCOOP PO POWD: 17.0000 g | Freq: Two times a day (BID) | ORAL | Status: DC | PRN

## 2023-03-23 MED ORDER — POTASSIUM CHLORIDE CRYS ER 20 MEQ PO TBCR
40.0000 meq | EXTENDED_RELEASE_TABLET | Freq: Once | ORAL | Status: DC
  Filled 2023-03-23: qty 2

## 2023-03-23 MED ORDER — SENNOSIDES-DOCUSATE SODIUM 8.6-50 MG PO TABS: 1.0000 | ORAL_TABLET | Freq: Two times a day (BID) | ORAL | Status: DC | PRN

## 2023-03-23 MED ORDER — POTASSIUM CHLORIDE CRYS ER 20 MEQ PO TBCR
40.0000 meq | EXTENDED_RELEASE_TABLET | ORAL | Status: AC
  Administered 2023-03-23 (×2): 40 meq via ORAL
  Filled 2023-03-23: qty 2

## 2023-03-23 MED ORDER — POTASSIUM CHLORIDE CRYS ER 20 MEQ PO TBCR: 40.0000 meq | EXTENDED_RELEASE_TABLET | ORAL | Status: DC

## 2023-03-23 MED ORDER — AMOXICILLIN-POT CLAVULANATE 875-125 MG PO TABS: 1.0000 | ORAL_TABLET | Freq: Two times a day (BID) | ORAL | Status: DC

## 2023-03-23 NOTE — Discharge Summary (Signed)
Pain Descriptors / Indicators: Sore Pain Intervention(s): Monitored during session End of Session: Start Time:SLP Start Time (ACUTE ONLY): 1054 Stop Time: SLP Stop Time (ACUTE ONLY): 1118 Time Calculation:SLP Time Calculation (min) (ACUTE ONLY): 24 min Charges: SLP Evaluations $ SLP Speech Visit: 1 Visit SLP Evaluations $BSS Swallow: 1 Procedure $MBS Swallow: 1 Procedure SLP visit diagnosis: SLP Visit Diagnosis: Dysphagia, oropharyngeal phase (R13.12) Past Medical History: Past Medical History: Diagnosis Date   Acute kidney failure (HCC)   Anxiety   Arthritis   Back pain   BPH (benign prostatic hypertrophy)   CHF (congestive heart failure) (HCC)   Clostridium difficile infection   Clotting disorder (HCC)   Depression   Diabetes (HCC) 12/11/2016  type 2   DVT (deep venous thrombosis) (HCC)   DVT of deep femoral vein, right (HCC) 06/29/2018  Edema, lower extremity   High cholesterol   Hypertension   Joint pain   Low back pain potentially associated with spinal stenosis   Neuromuscular disorder (HCC)   Neuropathy of lower extremity   bilateral  OSA (obstructive sleep apnea)   pt denies   Osteoarthritis   PE (pulmonary embolism)   Pneumonia   hx of x 2   Ventral hernia  Past Surgical History: Past Surgical History: Procedure Laterality Date  CARDIOVERSION N/A 08/19/2022  Procedure: CARDIOVERSION;  Surgeon: Meriam Sprague, MD;  Location: [facility];  Service: Cardiovascular;  Laterality: N/A;  EYE SURGERY    HERNIA REPAIR  1999  TOTAL KNEE ARTHROPLASTY Left 08/26/2020  Procedure: LEFT TOTAL KNEE ARTHROPLASTY;  Surgeon: Gean Birchwood, MD;  Location: [facility];  Service: Orthopedics;  Laterality: Left;  TOTAL KNEE ARTHROPLASTY Right 12/15/2022  Procedure: TOTAL KNEE ARTHROPLASTY;  Surgeon: Durene Romans, MD;  Location: [facility];  Service: Orthopedics;  Laterality: Right; Gwynneth Aliment, M.A., CF-SLP Speech Language Pathology, Acute Rehabilitation Services Secure Chat preferred [phone] 03/19/2023, 12:06 PM  VAS [name] LOWER EXTREMITY VENOUS (DVT)  Result Date: 03/18/2023  Lower Venous DVT Study Patient Name:  Tanner [name]  Date of Exam:   03/18/2023 Medical Rec #: [id number]       Accession #:    [id number] Date of Birth: 12/21/1945       Patient Gender: M Patient Age:   77 years Exam Location:  [facility] Procedure:      VAS [name] LOWER EXTREMITY VENOUS (DVT) Referring Phys: Dow Adolph --------------------------------------------------------------------------------  Indications: Edema. Status post fall on right knee, knee  effusion by X-ray. Right TKR 12/15/22  Comparison Study: Prior negative Right LEV done 01/07/22 Performing Technologist: Sherren Kerns RVS  Examination Guidelines: A complete evaluation includes B-mode imaging, spectral Doppler, color Doppler, and power Doppler as needed of all accessible portions of each vessel. Bilateral testing is considered an integral part of a complete examination. Limited examinations for reoccurring indications may be performed as noted. The reflux portion of the exam is performed with the patient in reverse Trendelenburg.  +--------+---------------+---------+-----------+----------------+-------------+ RIGHT   CompressibilityPhasicitySpontaneityProperties      Thrombus                                                                 Aging         +--------+---------------+---------+-----------+----------------+-------------+ CFV     Full  known as: PROSCAR Take 5 mg by mouth at bedtime.   Jardiance 25 MG Tabs tablet Generic drug: empagliflozin Take 12.5 mg by mouth daily.   multivitamins ther. w/minerals Tabs tablet Take 1 tablet by mouth daily.   polyethylene glycol powder 17 GM/SCOOP powder Commonly known as: MiraLax Take 17 g by mouth 2 (two) times daily as needed for moderate constipation.   senna-docusate 8.6-50 MG tablet Commonly known as: Senokot-S Take 1 tablet by mouth 2 (two) times daily between meals as needed for mild constipation.   torsemide 20 MG tablet Commonly known as: DEMADEX TAKE 1 TABLET BY MOUTH EVERY DAY               Discharge Care Instructions  (From admission, onward)           Start     Ordered   03/23/23 0000  Discharge wound care:       Comments: Offloading with foam dressing for left buttock  wound Closely monitor.   03/23/23 1129            Consultations: Orthopedic surgery  Procedures/Studies: None   DG Swallowing Func-Speech Pathology  Result Date: 03/19/2023 Table formatting from the original result was not included. Modified Barium Swallow Study Patient Details Name: Tanner [name] MRN: [id number] Date of Birth: 09/07/1945 Today's Date: 03/19/2023 HPI/PMH: HPI: Tanner [name] is a 77 yo male presenting to ED with AMS, productive cough, and body aches. Found to be hypoxic, which was improved with placement of 4L [place]. CXR with bibasilar airspace opacities, R>L, which may be reflective of atelectasis or PNA. 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 Pt's oral phase is characterized by R anterior spillage and moderate residue, which pt has difficulty clearing with the use of a liquid wash. He has reduced tongue base retraction contributing to significant vallecular residue across all trials. Direct visualization of the trachea is hard to achieve due to body habitus and positioning. Penetration of thin liquids without ejection noted (PAS 3), although suspect penetrates may have reached the level of the vocal folds before being sensed and attempts were made to expel. This appeared to improve with the use of a chin tuck posture, however, this continues to be difficult to assess without direct visualization. Pt has copious pharyngeal residue which increases the risk of airway invasion when the pt is at rest and the laryngeal vestibule remains unprotected. During the swallow, pt achieves excellent laryngeal elevation and closure. Suspect pt's most significant risk for aspiration is after the swallow due to residue. Recommend continuing diet of Dys 3 texture solids due to  oral deficits with thin liquids and use of a chin tuck and frequent throat clearing and coughing. He may require increased supervision to provide cueing and monitor for anterior loss and oral residue. Will continue to follow. Factors that may increase risk of adverse event in presence of aspiration Rubye Oaks & Clearance Coots 2021): Factors that may increase risk of adverse event in presence of aspiration Rubye Oaks & Clearance Coots 2021): Respiratory or GI disease; Limited mobility Recommendations/Plan: Swallowing Evaluation Recommendations Swallowing Evaluation Recommendations Recommendations: PO diet PO Diet Recommendation: Dysphagia 3 (Mechanical soft); Thin liquids (Level 0) Liquid Administration via: Cup; Straw Medication Administration: Crushed with puree Supervision: Staff to assist with self-feeding; Full supervision/cueing for swallowing strategies Swallowing strategies  :  Physician Discharge Summary  CORTLAND CREHAN GNF:[id number] DOB: 1946/04/05 DOA: 03/18/2023  PCP: Myrlene Broker, MD  Admit date: 03/18/2023 Discharge date: 03/23/2023 Admitted From: Home Disposition: SNF Recommendations for Outpatient Follow-up:  Outpatient follow-up with orthopedic surgery as previously planned Check CMP and CBC in 1 week Please follow up on the following pending results: None   Discharge Condition: Stable CODE STATUS: Full code   Hospital course 77 year old M with PMH of PE/PAF on Eliquis, HTN, HLD, HFmrEF, CKD-3A, OSA and recent right TKA by Dr. Charlann Boxer presented to ED with altered mental status, productive cough, myalgia and hypoxemia to 80s per EMS, and admitted for acute respiratory failure with hypoxia due to pneumonia.  CXR showed bibasilar opacities, right > left.  He also had significant right knee effusion with RLE swelling and erythema.  He was hemodynamically stable.  Lower extremity venous Doppler ordered.  Started on ceftriaxone and Zithromax, and admitted.   Patient meets criteria for severe sepsis due to aspiration pneumonia.  Blood cultures NGTD.  Encephalopathy, respiratory symptoms and right lower extremity cellulitis/swelling improved.  Antibiotics de-escalated to p.o. Augmentin and Zithromax.  Discharge on p.o. Augmentin for 3 more days to complete a total of 8 days course.  Therapy recommended SNF.    In regards to right TKA, outpatient follow-up with orthopedic surgery.   See individual problem list below for more.   Problems addressed during this hospitalization Principal Problem:   Aspiration pneumonia (HCC) Active Problems:   Essential hypertension   Chronic kidney disease (CKD), stage III (moderate) (HCC)   History of pulmonary embolus (PE)   OSA (obstructive sleep apnea)   Morbid obesity (HCC)   Chronic systolic heart failure (HCC)   Paroxysmal atrial fibrillation (HCC)   S/P total knee arthroplasty, right   Pressure injury of  skin   Severe sepsis (HCC)   Severe sepsis due to bibasilar aspiration pneumonia and RLE cellulitis: POA.  Had leukocytosis, tachypnea, respiratory failure and mental status change on presentation.  Concern for aspiration pneumonia versus CAP given mental status change and risk for aspiration.  He also have RLE cellulitis.  Sepsis physiology, respiratory status and mental status improved.  Blood cultures NGTD -IV CTX 10/3>>> p.o. Augmentin 10/5-10/11 -Zithromax 10/3-10/5. -SLP recommended dysphagia 3 diet. -Aspiration precaution   Acute hypoxic respiratory failure secondary to the above: Resolved.  Currently on room air.    Generalized weakness with fall: Reportedly fell 1 week ago.  No apparent trauma but significant right knee effusion. RLE edema/cellulitis post fall: Right knee x-ray showed large effusion.  LE venous Doppler negative for DVT. History of right TKA in 12/2022 by Dr. Charlann Boxer Large right knee effusion -Orthopedic surgery consulted and did not feel he needs aspiration.  Recommended outpatient follow-up with Dr. Charlann Boxer -Antibiotics as above -Fall precaution -Continue PT/OT   Acute metabolic encephalopathy/delirium: Multifactorial including severe sepsis, respiratory failure and polypharmacy.  No focal neurodeficit on exam.  UDS, ammonia, B12, VBG, TSH and CT head unrevealing.  Resolved. -Treat treatable causes -Discontinued sedating medication -Reorientation and delirium precaution   Chronic HFmrEF: TTE in 08/2022 with LVEF of 45 to 50%, GH, indeterminate DD, and normal RV SF.  Appears euvolemic except for significant RLE edema that has improved with diuretics.  He had net -6 L -Decrease torsemide to home dose -Continue Jardiance -Check renal function in 1 week   DM-2 with hyperglycemia and hypoglycemia: A1c 5.3%.  On Jardiance.  No longer on Ozempic. -Continue home Jardiance   Paroxysmal A-fib: Rate  Pain Descriptors / Indicators: Sore Pain Intervention(s): Monitored during session End of Session: Start Time:SLP Start Time (ACUTE ONLY): 1054 Stop Time: SLP Stop Time (ACUTE ONLY): 1118 Time Calculation:SLP Time Calculation (min) (ACUTE ONLY): 24 min Charges: SLP Evaluations $ SLP Speech Visit: 1 Visit SLP Evaluations $BSS Swallow: 1 Procedure $MBS Swallow: 1 Procedure SLP visit diagnosis: SLP Visit Diagnosis: Dysphagia, oropharyngeal phase (R13.12) Past Medical History: Past Medical History: Diagnosis Date   Acute kidney failure (HCC)   Anxiety   Arthritis   Back pain   BPH (benign prostatic hypertrophy)   CHF (congestive heart failure) (HCC)   Clostridium difficile infection   Clotting disorder (HCC)   Depression   Diabetes (HCC) 12/11/2016  type 2   DVT (deep venous thrombosis) (HCC)   DVT of deep femoral vein, right (HCC) 06/29/2018  Edema, lower extremity   High cholesterol   Hypertension   Joint pain   Low back pain potentially associated with spinal stenosis   Neuromuscular disorder (HCC)   Neuropathy of lower extremity   bilateral  OSA (obstructive sleep apnea)   pt denies   Osteoarthritis   PE (pulmonary embolism)   Pneumonia   hx of x 2   Ventral hernia  Past Surgical History: Past Surgical History: Procedure Laterality Date  CARDIOVERSION N/A 08/19/2022  Procedure: CARDIOVERSION;  Surgeon: Meriam Sprague, MD;  Location: [facility];  Service: Cardiovascular;  Laterality: N/A;  EYE SURGERY    HERNIA REPAIR  1999  TOTAL KNEE ARTHROPLASTY Left 08/26/2020  Procedure: LEFT TOTAL KNEE ARTHROPLASTY;  Surgeon: Gean Birchwood, MD;  Location: [facility];  Service: Orthopedics;  Laterality: Left;  TOTAL KNEE ARTHROPLASTY Right 12/15/2022  Procedure: TOTAL KNEE ARTHROPLASTY;  Surgeon: Durene Romans, MD;  Location: [facility];  Service: Orthopedics;  Laterality: Right; Gwynneth Aliment, M.A., CF-SLP Speech Language Pathology, Acute Rehabilitation Services Secure Chat preferred [phone] 03/19/2023, 12:06 PM  VAS [name] LOWER EXTREMITY VENOUS (DVT)  Result Date: 03/18/2023  Lower Venous DVT Study Patient Name:  Tanner [name]  Date of Exam:   03/18/2023 Medical Rec #: [id number]       Accession #:    [id number] Date of Birth: 12/21/1945       Patient Gender: M Patient Age:   77 years Exam Location:  [facility] Procedure:      VAS [name] LOWER EXTREMITY VENOUS (DVT) Referring Phys: Dow Adolph --------------------------------------------------------------------------------  Indications: Edema. Status post fall on right knee, knee  effusion by X-ray. Right TKR 12/15/22  Comparison Study: Prior negative Right LEV done 01/07/22 Performing Technologist: Sherren Kerns RVS  Examination Guidelines: A complete evaluation includes B-mode imaging, spectral Doppler, color Doppler, and power Doppler as needed of all accessible portions of each vessel. Bilateral testing is considered an integral part of a complete examination. Limited examinations for reoccurring indications may be performed as noted. The reflux portion of the exam is performed with the patient in reverse Trendelenburg.  +--------+---------------+---------+-----------+----------------+-------------+ RIGHT   CompressibilityPhasicitySpontaneityProperties      Thrombus                                                                 Aging         +--------+---------------+---------+-----------+----------------+-------------+ CFV     Full  known as: PROSCAR Take 5 mg by mouth at bedtime.   Jardiance 25 MG Tabs tablet Generic drug: empagliflozin Take 12.5 mg by mouth daily.   multivitamins ther. w/minerals Tabs tablet Take 1 tablet by mouth daily.   polyethylene glycol powder 17 GM/SCOOP powder Commonly known as: MiraLax Take 17 g by mouth 2 (two) times daily as needed for moderate constipation.   senna-docusate 8.6-50 MG tablet Commonly known as: Senokot-S Take 1 tablet by mouth 2 (two) times daily between meals as needed for mild constipation.   torsemide 20 MG tablet Commonly known as: DEMADEX TAKE 1 TABLET BY MOUTH EVERY DAY               Discharge Care Instructions  (From admission, onward)           Start     Ordered   03/23/23 0000  Discharge wound care:       Comments: Offloading with foam dressing for left buttock  wound Closely monitor.   03/23/23 1129            Consultations: Orthopedic surgery  Procedures/Studies: None   DG Swallowing Func-Speech Pathology  Result Date: 03/19/2023 Table formatting from the original result was not included. Modified Barium Swallow Study Patient Details Name: Tanner [name] MRN: [id number] Date of Birth: 09/07/1945 Today's Date: 03/19/2023 HPI/PMH: HPI: Tanner [name] is a 77 yo male presenting to ED with AMS, productive cough, and body aches. Found to be hypoxic, which was improved with placement of 4L [place]. CXR with bibasilar airspace opacities, R>L, which may be reflective of atelectasis or PNA. 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 Pt's oral phase is characterized by R anterior spillage and moderate residue, which pt has difficulty clearing with the use of a liquid wash. He has reduced tongue base retraction contributing to significant vallecular residue across all trials. Direct visualization of the trachea is hard to achieve due to body habitus and positioning. Penetration of thin liquids without ejection noted (PAS 3), although suspect penetrates may have reached the level of the vocal folds before being sensed and attempts were made to expel. This appeared to improve with the use of a chin tuck posture, however, this continues to be difficult to assess without direct visualization. Pt has copious pharyngeal residue which increases the risk of airway invasion when the pt is at rest and the laryngeal vestibule remains unprotected. During the swallow, pt achieves excellent laryngeal elevation and closure. Suspect pt's most significant risk for aspiration is after the swallow due to residue. Recommend continuing diet of Dys 3 texture solids due to  oral deficits with thin liquids and use of a chin tuck and frequent throat clearing and coughing. He may require increased supervision to provide cueing and monitor for anterior loss and oral residue. Will continue to follow. Factors that may increase risk of adverse event in presence of aspiration Rubye Oaks & Clearance Coots 2021): Factors that may increase risk of adverse event in presence of aspiration Rubye Oaks & Clearance Coots 2021): Respiratory or GI disease; Limited mobility Recommendations/Plan: Swallowing Evaluation Recommendations Swallowing Evaluation Recommendations Recommendations: PO diet PO Diet Recommendation: Dysphagia 3 (Mechanical soft); Thin liquids (Level 0) Liquid Administration via: Cup; Straw Medication Administration: Crushed with puree Supervision: Staff to assist with self-feeding; Full supervision/cueing for swallowing strategies Swallowing strategies  :  Pain Descriptors / Indicators: Sore Pain Intervention(s): Monitored during session End of Session: Start Time:SLP Start Time (ACUTE ONLY): 1054 Stop Time: SLP Stop Time (ACUTE ONLY): 1118 Time Calculation:SLP Time Calculation (min) (ACUTE ONLY): 24 min Charges: SLP Evaluations $ SLP Speech Visit: 1 Visit SLP Evaluations $BSS Swallow: 1 Procedure $MBS Swallow: 1 Procedure SLP visit diagnosis: SLP Visit Diagnosis: Dysphagia, oropharyngeal phase (R13.12) Past Medical History: Past Medical History: Diagnosis Date   Acute kidney failure (HCC)   Anxiety   Arthritis   Back pain   BPH (benign prostatic hypertrophy)   CHF (congestive heart failure) (HCC)   Clostridium difficile infection   Clotting disorder (HCC)   Depression   Diabetes (HCC) 12/11/2016  type 2   DVT (deep venous thrombosis) (HCC)   DVT of deep femoral vein, right (HCC) 06/29/2018  Edema, lower extremity   High cholesterol   Hypertension   Joint pain   Low back pain potentially associated with spinal stenosis   Neuromuscular disorder (HCC)   Neuropathy of lower extremity   bilateral  OSA (obstructive sleep apnea)   pt denies   Osteoarthritis   PE (pulmonary embolism)   Pneumonia   hx of x 2   Ventral hernia  Past Surgical History: Past Surgical History: Procedure Laterality Date  CARDIOVERSION N/A 08/19/2022  Procedure: CARDIOVERSION;  Surgeon: Meriam Sprague, MD;  Location: [facility];  Service: Cardiovascular;  Laterality: N/A;  EYE SURGERY    HERNIA REPAIR  1999  TOTAL KNEE ARTHROPLASTY Left 08/26/2020  Procedure: LEFT TOTAL KNEE ARTHROPLASTY;  Surgeon: Gean Birchwood, MD;  Location: [facility];  Service: Orthopedics;  Laterality: Left;  TOTAL KNEE ARTHROPLASTY Right 12/15/2022  Procedure: TOTAL KNEE ARTHROPLASTY;  Surgeon: Durene Romans, MD;  Location: [facility];  Service: Orthopedics;  Laterality: Right; Gwynneth Aliment, M.A., CF-SLP Speech Language Pathology, Acute Rehabilitation Services Secure Chat preferred [phone] 03/19/2023, 12:06 PM  VAS [name] LOWER EXTREMITY VENOUS (DVT)  Result Date: 03/18/2023  Lower Venous DVT Study Patient Name:  Tanner [name]  Date of Exam:   03/18/2023 Medical Rec #: [id number]       Accession #:    [id number] Date of Birth: 12/21/1945       Patient Gender: M Patient Age:   77 years Exam Location:  [facility] Procedure:      VAS [name] LOWER EXTREMITY VENOUS (DVT) Referring Phys: Dow Adolph --------------------------------------------------------------------------------  Indications: Edema. Status post fall on right knee, knee  effusion by X-ray. Right TKR 12/15/22  Comparison Study: Prior negative Right LEV done 01/07/22 Performing Technologist: Sherren Kerns RVS  Examination Guidelines: A complete evaluation includes B-mode imaging, spectral Doppler, color Doppler, and power Doppler as needed of all accessible portions of each vessel. Bilateral testing is considered an integral part of a complete examination. Limited examinations for reoccurring indications may be performed as noted. The reflux portion of the exam is performed with the patient in reverse Trendelenburg.  +--------+---------------+---------+-----------+----------------+-------------+ RIGHT   CompressibilityPhasicitySpontaneityProperties      Thrombus                                                                 Aging         +--------+---------------+---------+-----------+----------------+-------------+ CFV     Full  Pain Descriptors / Indicators: Sore Pain Intervention(s): Monitored during session End of Session: Start Time:SLP Start Time (ACUTE ONLY): 1054 Stop Time: SLP Stop Time (ACUTE ONLY): 1118 Time Calculation:SLP Time Calculation (min) (ACUTE ONLY): 24 min Charges: SLP Evaluations $ SLP Speech Visit: 1 Visit SLP Evaluations $BSS Swallow: 1 Procedure $MBS Swallow: 1 Procedure SLP visit diagnosis: SLP Visit Diagnosis: Dysphagia, oropharyngeal phase (R13.12) Past Medical History: Past Medical History: Diagnosis Date   Acute kidney failure (HCC)   Anxiety   Arthritis   Back pain   BPH (benign prostatic hypertrophy)   CHF (congestive heart failure) (HCC)   Clostridium difficile infection   Clotting disorder (HCC)   Depression   Diabetes (HCC) 12/11/2016  type 2   DVT (deep venous thrombosis) (HCC)   DVT of deep femoral vein, right (HCC) 06/29/2018  Edema, lower extremity   High cholesterol   Hypertension   Joint pain   Low back pain potentially associated with spinal stenosis   Neuromuscular disorder (HCC)   Neuropathy of lower extremity   bilateral  OSA (obstructive sleep apnea)   pt denies   Osteoarthritis   PE (pulmonary embolism)   Pneumonia   hx of x 2   Ventral hernia  Past Surgical History: Past Surgical History: Procedure Laterality Date  CARDIOVERSION N/A 08/19/2022  Procedure: CARDIOVERSION;  Surgeon: Meriam Sprague, MD;  Location: [facility];  Service: Cardiovascular;  Laterality: N/A;  EYE SURGERY    HERNIA REPAIR  1999  TOTAL KNEE ARTHROPLASTY Left 08/26/2020  Procedure: LEFT TOTAL KNEE ARTHROPLASTY;  Surgeon: Gean Birchwood, MD;  Location: [facility];  Service: Orthopedics;  Laterality: Left;  TOTAL KNEE ARTHROPLASTY Right 12/15/2022  Procedure: TOTAL KNEE ARTHROPLASTY;  Surgeon: Durene Romans, MD;  Location: [facility];  Service: Orthopedics;  Laterality: Right; Gwynneth Aliment, M.A., CF-SLP Speech Language Pathology, Acute Rehabilitation Services Secure Chat preferred [phone] 03/19/2023, 12:06 PM  VAS [name] LOWER EXTREMITY VENOUS (DVT)  Result Date: 03/18/2023  Lower Venous DVT Study Patient Name:  Tanner [name]  Date of Exam:   03/18/2023 Medical Rec #: [id number]       Accession #:    [id number] Date of Birth: 12/21/1945       Patient Gender: M Patient Age:   77 years Exam Location:  [facility] Procedure:      VAS [name] LOWER EXTREMITY VENOUS (DVT) Referring Phys: Dow Adolph --------------------------------------------------------------------------------  Indications: Edema. Status post fall on right knee, knee  effusion by X-ray. Right TKR 12/15/22  Comparison Study: Prior negative Right LEV done 01/07/22 Performing Technologist: Sherren Kerns RVS  Examination Guidelines: A complete evaluation includes B-mode imaging, spectral Doppler, color Doppler, and power Doppler as needed of all accessible portions of each vessel. Bilateral testing is considered an integral part of a complete examination. Limited examinations for reoccurring indications may be performed as noted. The reflux portion of the exam is performed with the patient in reverse Trendelenburg.  +--------+---------------+---------+-----------+----------------+-------------+ RIGHT   CompressibilityPhasicitySpontaneityProperties      Thrombus                                                                 Aging         +--------+---------------+---------+-----------+----------------+-------------+ CFV     Full  Physician Discharge Summary  CORTLAND CREHAN GNF:[id number] DOB: 1946/04/05 DOA: 03/18/2023  PCP: Myrlene Broker, MD  Admit date: 03/18/2023 Discharge date: 03/23/2023 Admitted From: Home Disposition: SNF Recommendations for Outpatient Follow-up:  Outpatient follow-up with orthopedic surgery as previously planned Check CMP and CBC in 1 week Please follow up on the following pending results: None   Discharge Condition: Stable CODE STATUS: Full code   Hospital course 77 year old M with PMH of PE/PAF on Eliquis, HTN, HLD, HFmrEF, CKD-3A, OSA and recent right TKA by Dr. Charlann Boxer presented to ED with altered mental status, productive cough, myalgia and hypoxemia to 80s per EMS, and admitted for acute respiratory failure with hypoxia due to pneumonia.  CXR showed bibasilar opacities, right > left.  He also had significant right knee effusion with RLE swelling and erythema.  He was hemodynamically stable.  Lower extremity venous Doppler ordered.  Started on ceftriaxone and Zithromax, and admitted.   Patient meets criteria for severe sepsis due to aspiration pneumonia.  Blood cultures NGTD.  Encephalopathy, respiratory symptoms and right lower extremity cellulitis/swelling improved.  Antibiotics de-escalated to p.o. Augmentin and Zithromax.  Discharge on p.o. Augmentin for 3 more days to complete a total of 8 days course.  Therapy recommended SNF.    In regards to right TKA, outpatient follow-up with orthopedic surgery.   See individual problem list below for more.   Problems addressed during this hospitalization Principal Problem:   Aspiration pneumonia (HCC) Active Problems:   Essential hypertension   Chronic kidney disease (CKD), stage III (moderate) (HCC)   History of pulmonary embolus (PE)   OSA (obstructive sleep apnea)   Morbid obesity (HCC)   Chronic systolic heart failure (HCC)   Paroxysmal atrial fibrillation (HCC)   S/P total knee arthroplasty, right   Pressure injury of  skin   Severe sepsis (HCC)   Severe sepsis due to bibasilar aspiration pneumonia and RLE cellulitis: POA.  Had leukocytosis, tachypnea, respiratory failure and mental status change on presentation.  Concern for aspiration pneumonia versus CAP given mental status change and risk for aspiration.  He also have RLE cellulitis.  Sepsis physiology, respiratory status and mental status improved.  Blood cultures NGTD -IV CTX 10/3>>> p.o. Augmentin 10/5-10/11 -Zithromax 10/3-10/5. -SLP recommended dysphagia 3 diet. -Aspiration precaution   Acute hypoxic respiratory failure secondary to the above: Resolved.  Currently on room air.    Generalized weakness with fall: Reportedly fell 1 week ago.  No apparent trauma but significant right knee effusion. RLE edema/cellulitis post fall: Right knee x-ray showed large effusion.  LE venous Doppler negative for DVT. History of right TKA in 12/2022 by Dr. Charlann Boxer Large right knee effusion -Orthopedic surgery consulted and did not feel he needs aspiration.  Recommended outpatient follow-up with Dr. Charlann Boxer -Antibiotics as above -Fall precaution -Continue PT/OT   Acute metabolic encephalopathy/delirium: Multifactorial including severe sepsis, respiratory failure and polypharmacy.  No focal neurodeficit on exam.  UDS, ammonia, B12, VBG, TSH and CT head unrevealing.  Resolved. -Treat treatable causes -Discontinued sedating medication -Reorientation and delirium precaution   Chronic HFmrEF: TTE in 08/2022 with LVEF of 45 to 50%, GH, indeterminate DD, and normal RV SF.  Appears euvolemic except for significant RLE edema that has improved with diuretics.  He had net -6 L -Decrease torsemide to home dose -Continue Jardiance -Check renal function in 1 week   DM-2 with hyperglycemia and hypoglycemia: A1c 5.3%.  On Jardiance.  No longer on Ozempic. -Continue home Jardiance   Paroxysmal A-fib: Rate  Pain Descriptors / Indicators: Sore Pain Intervention(s): Monitored during session End of Session: Start Time:SLP Start Time (ACUTE ONLY): 1054 Stop Time: SLP Stop Time (ACUTE ONLY): 1118 Time Calculation:SLP Time Calculation (min) (ACUTE ONLY): 24 min Charges: SLP Evaluations $ SLP Speech Visit: 1 Visit SLP Evaluations $BSS Swallow: 1 Procedure $MBS Swallow: 1 Procedure SLP visit diagnosis: SLP Visit Diagnosis: Dysphagia, oropharyngeal phase (R13.12) Past Medical History: Past Medical History: Diagnosis Date   Acute kidney failure (HCC)   Anxiety   Arthritis   Back pain   BPH (benign prostatic hypertrophy)   CHF (congestive heart failure) (HCC)   Clostridium difficile infection   Clotting disorder (HCC)   Depression   Diabetes (HCC) 12/11/2016  type 2   DVT (deep venous thrombosis) (HCC)   DVT of deep femoral vein, right (HCC) 06/29/2018  Edema, lower extremity   High cholesterol   Hypertension   Joint pain   Low back pain potentially associated with spinal stenosis   Neuromuscular disorder (HCC)   Neuropathy of lower extremity   bilateral  OSA (obstructive sleep apnea)   pt denies   Osteoarthritis   PE (pulmonary embolism)   Pneumonia   hx of x 2   Ventral hernia  Past Surgical History: Past Surgical History: Procedure Laterality Date  CARDIOVERSION N/A 08/19/2022  Procedure: CARDIOVERSION;  Surgeon: Meriam Sprague, MD;  Location: [facility];  Service: Cardiovascular;  Laterality: N/A;  EYE SURGERY    HERNIA REPAIR  1999  TOTAL KNEE ARTHROPLASTY Left 08/26/2020  Procedure: LEFT TOTAL KNEE ARTHROPLASTY;  Surgeon: Gean Birchwood, MD;  Location: [facility];  Service: Orthopedics;  Laterality: Left;  TOTAL KNEE ARTHROPLASTY Right 12/15/2022  Procedure: TOTAL KNEE ARTHROPLASTY;  Surgeon: Durene Romans, MD;  Location: [facility];  Service: Orthopedics;  Laterality: Right; Gwynneth Aliment, M.A., CF-SLP Speech Language Pathology, Acute Rehabilitation Services Secure Chat preferred [phone] 03/19/2023, 12:06 PM  VAS [name] LOWER EXTREMITY VENOUS (DVT)  Result Date: 03/18/2023  Lower Venous DVT Study Patient Name:  Tanner [name]  Date of Exam:   03/18/2023 Medical Rec #: [id number]       Accession #:    [id number] Date of Birth: 12/21/1945       Patient Gender: M Patient Age:   77 years Exam Location:  [facility] Procedure:      VAS [name] LOWER EXTREMITY VENOUS (DVT) Referring Phys: Dow Adolph --------------------------------------------------------------------------------  Indications: Edema. Status post fall on right knee, knee  effusion by X-ray. Right TKR 12/15/22  Comparison Study: Prior negative Right LEV done 01/07/22 Performing Technologist: Sherren Kerns RVS  Examination Guidelines: A complete evaluation includes B-mode imaging, spectral Doppler, color Doppler, and power Doppler as needed of all accessible portions of each vessel. Bilateral testing is considered an integral part of a complete examination. Limited examinations for reoccurring indications may be performed as noted. The reflux portion of the exam is performed with the patient in reverse Trendelenburg.  +--------+---------------+---------+-----------+----------------+-------------+ RIGHT   CompressibilityPhasicitySpontaneityProperties      Thrombus                                                                 Aging         +--------+---------------+---------+-----------+----------------+-------------+ CFV     Full  Pain Descriptors / Indicators: Sore Pain Intervention(s): Monitored during session End of Session: Start Time:SLP Start Time (ACUTE ONLY): 1054 Stop Time: SLP Stop Time (ACUTE ONLY): 1118 Time Calculation:SLP Time Calculation (min) (ACUTE ONLY): 24 min Charges: SLP Evaluations $ SLP Speech Visit: 1 Visit SLP Evaluations $BSS Swallow: 1 Procedure $MBS Swallow: 1 Procedure SLP visit diagnosis: SLP Visit Diagnosis: Dysphagia, oropharyngeal phase (R13.12) Past Medical History: Past Medical History: Diagnosis Date   Acute kidney failure (HCC)   Anxiety   Arthritis   Back pain   BPH (benign prostatic hypertrophy)   CHF (congestive heart failure) (HCC)   Clostridium difficile infection   Clotting disorder (HCC)   Depression   Diabetes (HCC) 12/11/2016  type 2   DVT (deep venous thrombosis) (HCC)   DVT of deep femoral vein, right (HCC) 06/29/2018  Edema, lower extremity   High cholesterol   Hypertension   Joint pain   Low back pain potentially associated with spinal stenosis   Neuromuscular disorder (HCC)   Neuropathy of lower extremity   bilateral  OSA (obstructive sleep apnea)   pt denies   Osteoarthritis   PE (pulmonary embolism)   Pneumonia   hx of x 2   Ventral hernia  Past Surgical History: Past Surgical History: Procedure Laterality Date  CARDIOVERSION N/A 08/19/2022  Procedure: CARDIOVERSION;  Surgeon: Meriam Sprague, MD;  Location: [facility];  Service: Cardiovascular;  Laterality: N/A;  EYE SURGERY    HERNIA REPAIR  1999  TOTAL KNEE ARTHROPLASTY Left 08/26/2020  Procedure: LEFT TOTAL KNEE ARTHROPLASTY;  Surgeon: Gean Birchwood, MD;  Location: [facility];  Service: Orthopedics;  Laterality: Left;  TOTAL KNEE ARTHROPLASTY Right 12/15/2022  Procedure: TOTAL KNEE ARTHROPLASTY;  Surgeon: Durene Romans, MD;  Location: [facility];  Service: Orthopedics;  Laterality: Right; Gwynneth Aliment, M.A., CF-SLP Speech Language Pathology, Acute Rehabilitation Services Secure Chat preferred [phone] 03/19/2023, 12:06 PM  VAS [name] LOWER EXTREMITY VENOUS (DVT)  Result Date: 03/18/2023  Lower Venous DVT Study Patient Name:  Tanner [name]  Date of Exam:   03/18/2023 Medical Rec #: [id number]       Accession #:    [id number] Date of Birth: 12/21/1945       Patient Gender: M Patient Age:   77 years Exam Location:  [facility] Procedure:      VAS [name] LOWER EXTREMITY VENOUS (DVT) Referring Phys: Dow Adolph --------------------------------------------------------------------------------  Indications: Edema. Status post fall on right knee, knee  effusion by X-ray. Right TKR 12/15/22  Comparison Study: Prior negative Right LEV done 01/07/22 Performing Technologist: Sherren Kerns RVS  Examination Guidelines: A complete evaluation includes B-mode imaging, spectral Doppler, color Doppler, and power Doppler as needed of all accessible portions of each vessel. Bilateral testing is considered an integral part of a complete examination. Limited examinations for reoccurring indications may be performed as noted. The reflux portion of the exam is performed with the patient in reverse Trendelenburg.  +--------+---------------+---------+-----------+----------------+-------------+ RIGHT   CompressibilityPhasicitySpontaneityProperties      Thrombus                                                                 Aging         +--------+---------------+---------+-----------+----------------+-------------+ CFV     Full

## 2023-03-23 NOTE — Progress Notes (Signed)
Patient refused CPAP for the night.  °

## 2023-03-23 NOTE — Plan of Care (Signed)
  Problem: Education: Goal: Individualized Educational Video(s) Outcome: Progressing   Problem: Activity: Goal: Ability to avoid complications of mobility impairment will improve Outcome: Progressing Goal: Range of joint motion will improve Outcome: Progressing   Problem: Clinical Measurements: Goal: Postoperative complications will be avoided or minimized Outcome: Progressing   Problem: Pain Management: Goal: Pain level will decrease with appropriate interventions Outcome: Progressing   Problem: Skin Integrity: Goal: Will show signs of wound healing Outcome: Progressing   Problem: Education: Goal: Ability to describe self-care measures that may prevent or decrease complications (Diabetes Survival Skills Education) will improve Outcome: Progressing Goal: Individualized Educational Video(s) Outcome: Progressing   Problem: Coping: Goal: Ability to adjust to condition or change in health will improve Outcome: Progressing   Problem: Fluid Volume: Goal: Ability to maintain a balanced intake and output will improve Outcome: Progressing   Problem: Health Behavior/Discharge Planning: Goal: Ability to identify and utilize available resources and services will improve Outcome: Progressing Goal: Ability to manage health-related needs will improve Outcome: Progressing   Problem: Metabolic: Goal: Ability to maintain appropriate glucose levels will improve Outcome: Progressing   Problem: Nutritional: Goal: Maintenance of adequate nutrition will improve Outcome: Progressing Goal: Progress toward achieving an optimal weight will improve Outcome: Progressing   Problem: Skin Integrity: Goal: Risk for impaired skin integrity will decrease Outcome: Progressing   Problem: Tissue Perfusion: Goal: Adequacy of tissue perfusion will improve Outcome: Progressing

## 2023-03-23 NOTE — Progress Notes (Signed)
Occupational Therapy Treatment Patient Details Name: Tanner [name] MRN: [id number] DOB: 1945/06/20 Today's Date: 03/23/2023   History of present illness 77 yo male presents to [facility] on 10/3 with AMS, fall, SOB. R knee xray shows large effusion. Pt with sepsis due to bibasilar aspiration PNA with associated respiratory failure, RLE cellulitis. PMH includes R TKR 12/15/2022, L TKR 2022, anxiety, BPH, HF, depression, DMII, DVT, HTN, PE, afib, CKDIII.   OT comments  Pt motivated to participate and improve. Pt mod A for supine to sit at EOB, able to transfer to [facility] with mod A to power STS and min A for stand pivot transfer. Pt max A for hygiene standing with RW. Pt able to transfer to recliner with increased time. Pt completed all activities with increased time for rest breaks. Pt DC plan still appropriate, planning to leave today.       If plan is discharge home, recommend the following:  Two people to help with walking and/or transfers;A lot of help with bathing/dressing/bathroom;Assistance with cooking/housework;Assistance with feeding;Assist for transportation;Help with stairs or ramp for entrance   Equipment Recommendations  BSC/3in1    Recommendations for Other Services      Precautions / Restrictions Precautions Precautions: Fall Precaution Comments: R TKR 12/2022, RLE cellulitis Restrictions Weight Bearing Restrictions: No       Mobility Bed Mobility Overal bed mobility: Needs Assistance Bed Mobility: Supine to Sit     Supine to sit: Mod assist     General bed mobility comments: mod A for supine to sit    Transfers Overall transfer level: Needs assistance Equipment used: Rolling walker (2 wheels) Transfers: Sit to/from Stand, Bed to chair/wheelchair/BSC Sit to Stand: Mod assist, From elevated surface     Step pivot transfers: Min assist     General transfer comment: mod A to power STS, min A for stand pivot to BSC/recliner     Balance Overall balance assessment:  Needs assistance Sitting-balance support: Single extremity supported, Feet supported Sitting balance-Leahy Scale: Fair Sitting balance - Comments: posterior lean, requires occassional support to maintain balance   Standing balance support: Bilateral upper extremity supported, During functional activity Standing balance-Leahy Scale: Poor Standing balance comment: reliant on RW                           ADL either performed or assessed with clinical judgement   ADL Overall ADL's : Needs assistance/impaired                     Lower Body Dressing: Maximal assistance;Sit to/from stand   Toilet Transfer: Minimal assistance;Rolling walker (2 wheels);Stand-pivot;Moderate assistance   Toileting- Clothing Manipulation and Hygiene: Total assistance;Sit to/from stand         General ADL Comments: Pt transfers to toilet with mod A to power STS, min A for stand pivot    Extremity/Trunk Assessment Upper Extremity Assessment Upper Extremity Assessment: Generalized weakness            Vision       Perception     Praxis      Cognition Arousal: Alert Behavior During Therapy: WFL for tasks assessed/performed Overall Cognitive Status: Impaired/Different from baseline Area of Impairment: Memory, Orientation                 Orientation Level: Situation, Time   Memory: Decreased short-term memory Following Commands: Follows one step commands with increased time Safety/Judgement: Decreased awareness of deficits, Decreased awareness of  safety   Problem Solving: Decreased initiation, Slow processing, Difficulty sequencing, Requires verbal cues, Requires tactile cues General Comments: patient is able to follow single step commands with increased time        Exercises      Shoulder Instructions       General Comments      Pertinent Vitals/ Pain       Pain Assessment Pain Assessment: 0-10 Pain Score: 3  Faces Pain Scale: Hurts a little bit Pain  Location: back Pain Descriptors / Indicators: Sore Pain Intervention(s): Monitored during session  Home Living                                          Prior Functioning/Environment              Frequency  Min 1X/week        Progress Toward Goals  OT Goals(current goals can now be found in the care plan section)  Progress towards OT goals: Progressing toward goals  Acute Rehab OT Goals OT Goal Formulation: With patient Time For Goal Achievement: 04/02/23 Potential to Achieve Goals: Good ADL Goals Pt Will Perform Grooming: with contact guard assist;standing Pt Will Perform Upper Body Dressing: with supervision;with set-up;sitting Pt Will Transfer to Toilet: with contact guard assist;ambulating;bedside commode Pt Will Perform Toileting - Clothing Manipulation and hygiene: with contact guard assist;sit to/from stand Additional ADL Goal #1: Pt will complete bed mobility mod I in preparation for ADLs.  Plan      Co-evaluation                 AM-PAC OT "6 Clicks" Daily Activity     Outcome Measure   Help from another person eating meals?: A Little Help from another person taking care of personal grooming?: A Little Help from another person toileting, which includes using toliet, bedpan, or urinal?: Total Help from another person bathing (including washing, rinsing, drying)?: A Lot Help from another person to put on and taking off regular upper body clothing?: A Little Help from another person to put on and taking off regular lower body clothing?: Total 6 Click Score: 13    End of Session Equipment Utilized During Treatment: Gait belt;Rolling walker (2 wheels)  OT Visit Diagnosis: Unsteadiness on feet (R26.81);Other abnormalities of gait and mobility (R26.89);Pain;Muscle weakness (generalized) (M62.81);Other symptoms and signs involving cognitive function   Activity Tolerance Patient tolerated treatment well   Patient Left in chair;with call  bell/phone within reach;with chair alarm set   Nurse Communication Mobility status;Other (comment)        Time: 1610-9604 OT Time Calculation (min): 34 min  Charges: OT General Charges $OT Visit: 1 Visit OT Treatments $Self Care/Home Management : 23-37 mins  [place], OTR/L   Alexis Goodell 03/23/2023, 11:37 AM

## 2023-03-23 NOTE — TOC Progression Note (Signed)
Transition of Care [facility]) - Progression Note    Patient Details  Name: Tanner [name] MRN: [id number] Date of Birth: 1945/06/23  Transition of Care [facility]) CM/SW Contact  Erin Sons, [place] Phone Number: 03/23/2023, 10:48 AM  Clinical Narrative:     [name] still pending at this time; [name] YQM#[id number]    Expected Discharge Plan: Skilled Nursing Facility Barriers to Discharge: Insurance Authorization  Expected Discharge Plan and Services In-house Referral: Clinical Social Work   Post Acute Care Choice: Skilled Nursing Facility Living arrangements for the past 2 months: Single Family Home                                       Social Determinants of Health (SDOH) Interventions SDOH Screenings   Food Insecurity: No Food Insecurity (12/15/2022)  Housing: Low Risk  (12/15/2022)  Transportation Needs: No Transportation Needs (12/15/2022)  Utilities: Not At Risk (12/15/2022)  Alcohol Screen: Low Risk  (10/27/2022)  Depression (PHQ2-9): Low Risk  (01/04/2023)  Financial Resource Strain: Low Risk  (10/27/2022)  Physical Activity: Inactive (10/27/2022)  Social Connections: Moderately Integrated (10/27/2022)  Stress: No Stress Concern Present (12/31/2022)  Recent Concern: Stress - Stress Concern Present (10/30/2022)  Tobacco Use: Medium Risk (03/18/2023)    Readmission Risk Interventions    08/20/2022   11:10 AM  Readmission Risk Prevention Plan  Transportation Screening Complete  PCP or Specialist Appt within 3-5 Days Complete  HRI or Home Care Consult Complete  Palliative Care Screening Not Applicable  Medication Review (RN Care Manager) Complete

## 2023-03-24 ENCOUNTER — Ambulatory Visit: Payer: Commercial Managed Care - PPO | Attending: PHYSICIAN ASSISTANT | Admitting: PHYSICIAN ASSISTANT

## 2023-03-24 ENCOUNTER — Encounter (INDEPENDENT_AMBULATORY_CARE_PROVIDER_SITE_OTHER): Payer: Self-pay | Admitting: PHYSICIAN ASSISTANT

## 2023-03-24 ENCOUNTER — Other Ambulatory Visit: Payer: Self-pay

## 2023-03-24 VITALS — BP 118/74 | HR 74 | Ht 71.0 in | Wt 249.0 lb

## 2023-03-24 DIAGNOSIS — I10 Essential (primary) hypertension: Secondary | ICD-10-CM | POA: Diagnosis not present

## 2023-03-24 DIAGNOSIS — J690 Pneumonitis due to inhalation of food and vomit: Secondary | ICD-10-CM | POA: Diagnosis not present

## 2023-03-24 DIAGNOSIS — I480 Paroxysmal atrial fibrillation: Secondary | ICD-10-CM | POA: Diagnosis not present

## 2023-03-24 DIAGNOSIS — N1832 Chronic kidney disease, stage 3b: Secondary | ICD-10-CM | POA: Diagnosis not present

## 2023-03-24 DIAGNOSIS — I4892 Unspecified atrial flutter: Secondary | ICD-10-CM | POA: Insufficient documentation

## 2023-03-24 DIAGNOSIS — Z720 Tobacco use: Secondary | ICD-10-CM

## 2023-03-24 DIAGNOSIS — E782 Mixed hyperlipidemia: Secondary | ICD-10-CM | POA: Insufficient documentation

## 2023-03-24 LAB — ECG 12 LEAD
Atrial Rate: 52 {beats}/min
Calculated P Axis: 0 degrees
Calculated R Axis: -65 degrees
Calculated T Axis: 51 degrees
PR Interval: 210 ms
QRS Duration: 88 ms
QT Interval: 450 ms
QTC Calculation: 418 ms
Ventricular rate: 52 {beats}/min

## 2023-03-24 NOTE — Progress Notes (Signed)
PROGRESS NOTE  Tanner [name] KGM:[id number] DOB: Feb 04, 1946   PCP: Myrlene Broker, MD  Patient is from: Home.  Uses rolling walker at baseline.  DOA: 03/18/2023 LOS: 6  Chief complaints Chief Complaint  Patient presents with   Altered Mental Status     Brief Narrative / Interim history: 77 year old M with PMH of PE/PAF on Eliquis, HTN, HLD, HFmrEF, CKD-3A, OSA and recent right TKA by Dr. Charlann Boxer presented to ED with altered mental status, productive cough, myalgia and hypoxemia to 80s per EMS, and admitted for acute respiratory failure with hypoxia due to pneumonia.  CXR showed bibasilar opacities, right > left.  He also had significant right knee effusion with RLE swelling and erythema. Patient met criteria for severe sepsis due to aspiration pneumonia.  Blood cultures NGTD.  Encephalopathy, respiratory symptoms and right lower extremity cellulitis/swelling improved.  Antibiotics de-escalated to p.o. Augmentin and Zithromax.  Therapy recommended SNF.  Medically stable for discharge.     Subjective: Pt denies any new complaints. Met patient sitting on recliner. Requesting to go back to bed    Objective: Vitals:   03/23/23 2048 03/24/23 0530 03/24/23 0927 03/24/23 1435  BP: (!) 143/62 (!) 146/66 (!) 157/68 (!) 157/60  Pulse: 65 69 68 67  Resp: 18 17 20 20   Temp: 98.6 F (37 C) 97.9 F (36.6 C) 98 F (36.7 C) 98 F (36.7 C)  TempSrc: Oral     SpO2: (!) 89% 93% 94% 94%  Weight:      Height:        Examination: General: NAD, chronically ill appearing  Cardiovascular: S1, S2 present Respiratory: Diminished air entry bilaterally  Abdomen: Soft, nontender, nondistended, bowel sounds present Musculoskeletal: No bilateral pedal edema noted Skin: Normal Psychiatry: Normal mood    Procedures:  None  Microbiology summarized: COVID-19, influenza and RSV PCR nonreactive Blood cultures NGTD  Assessment and plan: Principal Problem:   Aspiration pneumonia  (HCC) Active Problems:   Essential hypertension   Chronic kidney disease (CKD), stage III (moderate) (HCC)   History of pulmonary embolus (PE)   OSA (obstructive sleep apnea)   Morbid obesity (HCC)   Chronic systolic heart failure (HCC)   Paroxysmal atrial fibrillation (HCC)   S/P total knee arthroplasty, right   Pressure injury of skin   Severe sepsis (HCC)   Severe sepsis due to bibasilar aspiration pneumonia and RLE cellulitis: POA Had leukocytosis, tachypnea, respiratory failure and mental status change on presentation Concern for aspiration pneumonia versus CAP given mental status change and risk for aspiration.  Blood cultures NGTD IV CTX 10/3>>> p.o. Augmentin 10/5>> for 5 more days to complete total of 7 days course Zithromax 10/3-10/5. SLP recommended dysphagia 3 diet Aspiration precaution Pulmonary toilet PT/OT-recommended SNF.  TOC working on placement.   Acute hypoxic respiratory failure secondary to the above Resolved.  Currently on room air. Intensity spirometry Further management as above  Generalized weakness with fall Reportedly fell 1 week ago.  No apparent trauma but significant right knee effusion SNF  RLE edema/cellulitis post fall: Right knee x-ray showed large effusion.  LE venous Doppler negative for DVT.  History of right TKA in 12/2022 by Dr. Charlann Boxer Large right knee effusion Orthopedic surgery consulted and did not feel he needs aspiration.  Recommended outpatient follow-up with Dr. Charlann Boxer Antibiotics as above Fall precaution PT/OT   Acute metabolic encephalopathy Resolved Multifactorial including severe sepsis, respiratory failure and polypharmacy No focal neurodeficit on exam.  UDS, ammonia, B12, VBG, TSH and CT head  unrevealing.   Reorientation and delirium precaution Discontinued sedating medication  Chronic HFmrEF TTE in 08/2022 with LVEF of 45 to 50%, GH, indeterminate DD, and normal RV SF.  Appears euvolemic except for right lower extremity  swelling that has significantly improved with torsemide..  Held torsemide Continue home Jardiance. Strict intake and output, daily weights, renal functions and electrolytes   DM-2 with hyperglycemia and hypoglycemia A1c 5.3, On Jardiance.  No longer on Ozempic Discontinued CBG monitoring and SSI.  Paroxysmal A-fib Rate controlled Continue home Coreg, amiodarone and Eliquis Optimize electrolytes  OSA not on CPAP  CKD-3B Stable Continue monitoring   Hypertension BP improved. Continue home Coreg  Obesity: Elevated BMI with comorbidity as above Body mass index is 38.01 kg/m.  Pressure skin injury: POA Pressure Injury 03/18/23 Buttocks Left;Right Stage 2 -  Partial thickness loss of dermis presenting as a shallow open injury with a red, pink wound bed without slough. (Active)  03/18/23 0629  Location: Buttocks  Location Orientation: Left;Right  Staging: Stage 2 -  Partial thickness loss of dermis presenting as a shallow open injury with a red, pink wound bed without slough.  Wound Description (Comments):   Present on Admission: Yes  Dressing Type Foam - Lift dressing to assess site every shift 03/24/23 0800   DVT prophylaxis:   apixaban (ELIQUIS) tablet 5 mg  Code Status: Full code Family Communication: None at baseline Level of care: Med-Surg Status is: Inpatient Remains inpatient appropriate because: Severe sepsis due to aspiration pneumonia and right lower extremity cellulitis   Final disposition: SNF.  Medically stable for discharge.   Consultants:  Orthopedic surgery     Sch Meds:  Scheduled Meds:  amiodarone  200 mg Oral Daily   amoxicillin-clavulanate  1 tablet Oral Q12H   apixaban  5 mg Oral BID   atorvastatin  80 mg Oral Daily   buPROPion  150 mg Oral BID   carvedilol  6.25 mg Oral BID WC   empagliflozin  25 mg Oral Daily   finasteride  5 mg Oral QHS   mouth rinse  15 mL Mouth Rinse 4 times per day   potassium chloride  40 mEq Oral Once    Continuous Infusions:   PRN Meds:.acetaminophen, guaiFENesin, mouth rinse, mouth rinse, polyethylene glycol  Antimicrobials: Anti-infectives (From admission, onward)    Start     Dose/Rate Route Frequency Ordered Stop   03/23/23 0000  amoxicillin-clavulanate (AUGMENTIN) 875-125 MG tablet        1 tablet Oral Every 12 hours 03/23/23 1129 03/26/23 2359   03/20/23 2200  amoxicillin-clavulanate (AUGMENTIN) 875-125 MG per tablet 1 tablet        1 tablet Oral Every 12 hours 03/20/23 1014 03/25/23 0959   03/20/23 1000  amoxicillin-clavulanate (AUGMENTIN) 875-125 MG per tablet 1 tablet  Status:  Discontinued        1 tablet Oral Every 12 hours 03/20/23 0710 03/20/23 1014   03/19/23 2200  azithromycin (ZITHROMAX) tablet 500 mg  Status:  Discontinued        500 mg Oral Daily 03/19/23 1026 03/20/23 1015   03/18/23 2200  azithromycin (ZITHROMAX) 500 mg in sodium chloride 0.9 % 250 mL IVPB  Status:  Discontinued        500 mg 250 mL/hr over 60 Minutes Intravenous Every 24 hours 03/18/23 0543 03/19/23 1026   03/18/23 1000  cefTRIAXone (ROCEPHIN) 2 g in sodium chloride 0.9 % 100 mL IVPB  Status:  Discontinued  2 g 200 mL/hr over 30 Minutes Intravenous Every 24 hours 03/18/23 0543 03/20/23 0710   03/18/23 0230  cefTRIAXone (ROCEPHIN) 1 g in sodium chloride 0.9 % 100 mL IVPB        1 g 200 mL/hr over 30 Minutes Intravenous  Once 03/18/23 0228 03/18/23 0327   03/18/23 0230  azithromycin (ZITHROMAX) 500 mg in sodium chloride 0.9 % 250 mL IVPB        500 mg 250 mL/hr over 60 Minutes Intravenous  Once 03/18/23 0228 03/18/23 0402        I have personally reviewed the following labs and images: CBC: Recent Labs  Lab 03/18/23 0207 03/18/23 0211 03/19/23 0758 03/20/23 1115 03/21/23 0635 03/22/23 0921 03/23/23 0420  WBC 13.6*   < > 10.7* 10.4 10.2 12.3* 11.8*  NEUTROABS 10.8*  --   --   --   --   --   --   HGB 13.0   < > 10.8* 11.4* 11.1* 12.3* 12.4*  HCT 42.8   < > 37.3* 38.8* 35.7*  41.1 41.3  MCV 80.3   < > 83.4 82.6 80.4 79.5* 78.5*  PLT 197   < > 155 234 197 243 265   < > = values in this interval not displayed.   BMP &GFR Recent Labs  Lab 03/19/23 0758 03/20/23 1115 03/21/23 0635 03/22/23 0921 03/23/23 0420 03/23/23 1346  NA 137 136 141 136 140 139  K 4.2 4.3 3.9 3.9 3.0* 3.5  CL 104 101 97* 96* 95* 90*  CO2 23 25 27 25  32 31  GLUCOSE 87 97 119* 154* 106* 123*  BUN 20 17 22  27* 29* 32*  CREATININE 1.71* 1.46* 1.76* 1.79* 1.82* 1.92*  CALCIUM 8.6* 8.5* 8.9 8.5* 8.6* 8.9  MG 2.1 2.2 2.0 2.3 2.2  --   PHOS  --  3.4 3.6 3.7 4.1 3.7   Estimated Creatinine Clearance: 35.7 mL/min (A) (by C-G formula based on SCr of 1.92 mg/dL (H)). Liver & Pancreas: Recent Labs  Lab 03/18/23 0207 03/18/23 4098 03/20/23 1115 03/21/23 0635 03/22/23 0921 03/23/23 0420 03/23/23 1346  AST 15  --   --   --   --   --   --   ALT 16  --   --   --   --   --   --   ALKPHOS 85  --   --   --   --   --   --   BILITOT 1.2  --   --   --   --   --   --   PROT 6.8  --   --   --   --   --   --   ALBUMIN 3.4*   < > 2.5* 2.4* 2.6* 2.4* 2.6*   < > = values in this interval not displayed.   No results for input(s): "LIPASE", "AMYLASE" in the last 168 hours. Recent Labs  Lab 03/18/23 1831  AMMONIA 19   Diabetic: Recent Labs    03/22/23 0920  HGBA1C 5.3   Recent Labs  Lab 03/19/23 0736 03/19/23 1132 03/19/23 1653 03/20/23 0713 03/21/23 2057  GLUCAP 76 98 87 89 120*   Cardiac Enzymes: No results for input(s): "CKTOTAL", "CKMB", "CKMBINDEX", "TROPONINI" in the last 168 hours. No results for input(s): "PROBNP" in the last 8760 hours. Coagulation Profile: Recent Labs  Lab 03/18/23 0325  INR 1.5*   Thyroid Function Tests: No results for input(s): "TSH", "T4TOTAL", "FREET4", "T3FREE", "THYROIDAB"  in the last 72 hours.  Lipid Profile: No results for input(s): "CHOL", "HDL", "LDLCALC", "TRIG", "CHOLHDL", "LDLDIRECT" in the last 72 hours. Anemia Panel: No results for  input(s): "VITAMINB12", "FOLATE", "FERRITIN", "TIBC", "IRON", "RETICCTPCT" in the last 72 hours.  Urine analysis:    Component Value Date/Time   COLORURINE YELLOW 03/18/2023 2213   APPEARANCEUR CLEAR 03/18/2023 2213   LABSPEC 1.017 03/18/2023 2213   PHURINE 5.0 03/18/2023 2213   GLUCOSEU >=500 (A) 03/18/2023 2213   GLUCOSEU NEGATIVE 09/13/2007 1028   HGBUR SMALL (A) 03/18/2023 2213   BILIRUBINUR NEGATIVE 03/18/2023 2213   BILIRUBINUR negative 04/19/2015 1417   BILIRUBINUR neg 02/12/2013 1449   KETONESUR NEGATIVE 03/18/2023 2213   PROTEINUR 100 (A) 03/18/2023 2213   UROBILINOGEN 0.2 04/19/2015 1417   UROBILINOGEN 0.2 mg/dL 29/52/8413 2440   NITRITE NEGATIVE 03/18/2023 2213   LEUKOCYTESUR SMALL (A) 03/18/2023 2213   Sepsis Labs: Invalid input(s): "PROCALCITONIN", "LACTICIDVEN"  Microbiology: Recent Results (from the past 240 hour(s))  Blood Culture (routine x 2)     Status: None   Collection Time: 03/18/23  3:22 AM   Specimen: BLOOD RIGHT HAND  Result Value Ref Range Status   Specimen Description BLOOD RIGHT HAND  Final   Special Requests   Final    BOTTLES DRAWN AEROBIC AND ANAEROBIC Blood Culture results may not be optimal due to an inadequate volume of blood received in culture bottles   Culture   Final    NO GROWTH 5 DAYS Performed at [facility], [address] [place], [place] [address]    Report Status 03/23/2023 FINAL  Final  Resp panel by RT-PCR (RSV, Flu A&B, Covid) Anterior Nasal Swab     Status: None   Collection Time: 03/18/23  3:32 AM   Specimen: Anterior Nasal Swab  Result Value Ref Range Status   SARS Coronavirus 2 by RT PCR NEGATIVE NEGATIVE Final   Influenza A by PCR NEGATIVE NEGATIVE Final   Influenza B by PCR NEGATIVE NEGATIVE Final    Comment: (NOTE) The Xpert Xpress SARS-CoV-2/FLU/RSV plus assay is intended as an aid in the diagnosis of influenza from Nasopharyngeal swab specimens and should not be used as a sole basis for treatment. Nasal  washings and aspirates are unacceptable for Xpert Xpress SARS-CoV-2/FLU/RSV testing.  Fact Sheet for Patients: BloggerCourse.com  Fact Sheet for Healthcare Providers: SeriousBroker.it  This test is not yet approved or cleared by the [place] FDA and has been authorized for detection and/or diagnosis of SARS-CoV-2 by FDA under an Emergency Use Authorization (EUA). This EUA will remain in effect (meaning this test can be used) for the duration of the COVID-19 declaration under Section 564(b)(1) of the Act, 21 U.S.C. section 360bbb-3(b)(1), unless the authorization is terminated or revoked.     Resp Syncytial Virus by PCR NEGATIVE NEGATIVE Final    Comment: (NOTE) Fact Sheet for Patients: BloggerCourse.com  Fact Sheet for Healthcare Providers: SeriousBroker.it  This test is not yet approved or cleared by the [place] FDA and has been authorized for detection and/or diagnosis of SARS-CoV-2 by FDA under an Emergency Use Authorization (EUA). This EUA will remain in effect (meaning this test can be used) for the duration of the COVID-19 declaration under Section 564(b)(1) of the Act, 21 U.S.C. section 360bbb-3(b)(1), unless the authorization is terminated or revoked.  Performed at [facility], [address] [place], [place] [address]   Blood Culture (routine x 2)     Status: None   Collection  Time: 03/18/23  8:42 AM   Specimen: BLOOD LEFT HAND  Result Value Ref Range Status   Specimen Description BLOOD LEFT HAND  Final   Special Requests   Final    BOTTLES DRAWN AEROBIC AND ANAEROBIC Blood Culture results may not be optimal due to an inadequate volume of blood received in culture bottles   Culture   Final    NO GROWTH 5 DAYS Performed at [facility], [address], [place], [place] [address]    Report Status 03/23/2023 FINAL  Final    Radiology  Studies: No results found.    Briant Cedar, MD [facility]  If 7PM-7AM, please contact night-coverage [email or web address] 03/24/2023, 6:37 PM

## 2023-03-24 NOTE — Plan of Care (Signed)
  Problem: Coping: Goal: Ability to adjust to condition or change in health will improve Outcome: Progressing   Problem: Pain Management: Goal: Pain level will decrease with appropriate interventions Outcome: Progressing   

## 2023-03-24 NOTE — Progress Notes (Signed)
Patient's nurse informed me that he wants to talk to me regarding his discharge.  Went to talk to the patient and explained that we are just waiting for insurance auth as he already have bed at [facility], but patient was not happy as he was not discharging today and says that he feels trapped here.  He also called 911 earlier and while I was talking to the patient 2 officers from [employer] came to the room. Patient was on the phone with the son and the officers, CN and the son tried to explain the process of insurance approval but the patient is not ready to hear anything from [name]. He states that nobody is listening to him and and not helping in any way.

## 2023-03-24 NOTE — Consult Note (Signed)
Value-Based Care Institute  [facility] Inpatient Consult   03/24/2023  KARSTON HYLAND 02-15-1946 [id number]  Value-Based Care Institute [facility] [[facility]]  Accountable Care Organization [ACO] Patient:  [facility]  Primary Care Provider:  Myrlene Broker, MD with [place] at [facility], patient also seen at [facility]  Patient is currently active with [facility] [[facility]] Care Management for care coordination services.  Patient has been engaged by a Horticulturist, commercial.  The community based plan of care has focused on disease management and community resource support.  Patient admitted with aspiration pneumonia.  Plan: Will update the [facility] Coordinators of patient's hospitalization and disposition needs. Patient is resting on rounds no family at bedside.  Review reveals patient being recommended for a SNF rehab level of care as patient was living alone.  Son is contact.  Awaiting insurance authorization noted in inpatient [facility] team notes.  Of note, [facility] Care Management services does not replace or interfere with any services that are needed or arranged by inpatient [facility] care management team.   Charlesetta Shanks, RN, BSN, CCM [place]  [facility], [facility] Liaison Direct Dial: [phone] or secure chat Website: [email or web address]

## 2023-03-24 NOTE — TOC Progression Note (Signed)
Transition of Care [facility]) - Progression Note    Patient Details  Name: Tanner [name] MRN: [id number] Date of Birth: [birth year]  Transition of [facility]) CM/SW Contact  Erin Sons, [place] Phone Number: 03/24/2023, 4:13 PM  Clinical Narrative:     [name] still pending at this time. Pt's son updated.   Expected Discharge Plan: Skilled Nursing Facility Barriers to Discharge: Insurance Authorization  Expected Discharge Plan and Services In-house Referral: Clinical Social Work   Post Acute Care Choice: Skilled Nursing Facility Living arrangements for the past 2 months: Single Family Home Expected Discharge Date: 03/23/23                                     Social Determinants of Health (SDOH) Interventions SDOH Screenings   Food Insecurity: No Food Insecurity (12/15/2022)  Housing: Low Risk  (12/15/2022)  Transportation Needs: No Transportation Needs (12/15/2022)  Utilities: Not At Risk (12/15/2022)  Alcohol Screen: Low Risk  (10/27/2022)  Depression (PHQ2-9): Low Risk  (01/04/2023)  Financial Resource Strain: Low Risk  (10/27/2022)  Physical Activity: Inactive (10/27/2022)  Social Connections: Moderately Integrated (10/27/2022)  Stress: No Stress Concern Present (12/31/2022)  Recent Concern: Stress - Stress Concern Present (10/30/2022)  Tobacco Use: Medium Risk (03/18/2023)    Readmission Risk Interventions    08/20/2022   11:10 AM  Readmission Risk Prevention Plan  Transportation Screening Complete  PCP or Specialist Appt within 3-5 Days Complete  HRI or Home Care Consult Complete  Palliative Care Screening Not Applicable  Medication Review (RN Care Manager) Complete

## 2023-03-24 NOTE — TOC Progression Note (Signed)
Transition of Care [facility]) - Progression Note    Patient Details  Name: Tanner [name] MRN: [id number] Date of Birth: October 03, 1945  Transition of Care [facility]) CM/SW Contact  Erin Sons, [place] Phone Number: 03/24/2023, 9:45 AM  Clinical Narrative:     [name] still pending at this time. TOC continuing to follow.   Expected Discharge Plan: Skilled Nursing Facility Barriers to Discharge: Insurance Authorization  Expected Discharge Plan and Services In-house Referral: Clinical Social Work   Post Acute Care Choice: Skilled Nursing Facility Living arrangements for the past 2 months: Single Family Home Expected Discharge Date: 03/23/23                                     Social Determinants of Health (SDOH) Interventions SDOH Screenings   Food Insecurity: No Food Insecurity (12/15/2022)  Housing: Low Risk  (12/15/2022)  Transportation Needs: No Transportation Needs (12/15/2022)  Utilities: Not At Risk (12/15/2022)  Alcohol Screen: Low Risk  (10/27/2022)  Depression (PHQ2-9): Low Risk  (01/04/2023)  Financial Resource Strain: Low Risk  (10/27/2022)  Physical Activity: Inactive (10/27/2022)  Social Connections: Moderately Integrated (10/27/2022)  Stress: No Stress Concern Present (12/31/2022)  Recent Concern: Stress - Stress Concern Present (10/30/2022)  Tobacco Use: Medium Risk (03/18/2023)    Readmission Risk Interventions    08/20/2022   11:10 AM  Readmission Risk Prevention Plan  Transportation Screening Complete  PCP or Specialist Appt within 3-5 Days Complete  HRI or Home Care Consult Complete  Palliative Care Screening Not Applicable  Medication Review (RN Care Manager) Complete

## 2023-03-24 NOTE — Progress Notes (Addendum)
Speech Language Pathology Treatment: Dysphagia  Patient Details Name: Tanner [name] MRN: [id number] DOB: August 10, 1945 Today's Date: 03/24/2023 Time: 4098-1191 SLP Time Calculation (min) (ACUTE ONLY): 15 min  Assessment / Plan / Recommendation Clinical Impression  Pt's dentures cleaned and donned. Unfortunately top plate is slipping and will not adhere to hard palate. Pt states he uses denture cream but none at bedside. SLP has asked unit secretary to order and updated RN as well. Pt states he is able to eat "softer" foods without dentures but it is easier with them. He was independently able to recall chin tuck strategy and performed with overall independence. One time he leaned head back but self corrected with the next swallow. There was one delayed cough that pt states was not due to strategy. He does need reminders to clear throat/cough intermittently and multiple swallows- SLP reviewed this with him and added to his sheet on the wall in front of him. Recommend pt continue Dys 3 texture, thin liquids with chin tuck and additional strategies (as mentioned above). ST to continue    HPI HPI: Eastin "[name]" CHARBEL LOS is a 77 yo male presenting to ED with AMS, productive cough, and body aches. Found to be hypoxic, which was improved with placement of 4L [place]. CXR with bibasilar airspace opacities, R>L, which may be reflective of atelectasis or PNA. PMH includes paroxysmal A-fib on Eliquis, essential HTN, HLD, HFpEF, CKD 3A      SLP Plan  Continue with current plan of care      Recommendations for follow up therapy are one component of a multi-disciplinary discharge planning process, led by the attending physician.  Recommendations may be updated based on patient status, additional functional criteria and insurance authorization.    Recommendations  Diet recommendations: Dysphagia 3 (mechanical soft);Thin liquid Liquids provided via: Cup;Straw Medication Administration: Whole meds with  puree Supervision: Intermittent supervision to cue for compensatory strategies;Staff to assist with self feeding Compensations: Minimize environmental distractions;Slow rate;Small sips/bites;Monitor for anterior loss;Multiple dry swallows after each bite/sip;Clear throat intermittently;Hard cough after swallow;Chin tuck Postural Changes and/or Swallow Maneuvers: Seated upright 90 degrees;Upright 30-60 min after meal                  Oral care BID   Intermittent Supervision/Assistance Dysphagia, oropharyngeal phase (R13.12)     Continue with current plan of care     Royce Macadamia  03/24/2023, 10:06 AM

## 2023-03-24 NOTE — Progress Notes (Signed)
Thomas [name] is a 77 y.o. male seen in the office on 03/24/2023    Chief Complaint   Patient presents with    Atrial Flutter    Cardiac Care Follow Up         Patient Active Problem List   Diagnosis    Atrial flutter (CMS HCC)    Body mass index (BMI) of 33.0 to 33.9 in adult    Essential hypertension    Hyperlipidemia    Left bundle branch hemiblock    Paroxysmal atrial flutter (CMS HCC)    History of cardioversion    History of cardiac cath-Overall normal coronary anatomy 12/26/2018    Cardiac left ventricular ejection fraction 55% on TTE 12/24/21       History of Present Illness: This is 77 year old male patient with a past medical history of paroxysmal atrial flutter/fib, hypertension, hyperlipidemia, gout, and chronic shortness of breath-patient is to see pulmonology in this regard.  He was seen one time by Dr. Kristen Cardinal on 10/20/2017 secondary to atrial flutter.  Holter monitor, echocardiogram, and stress test were completed in the past.  Patient was seen at our office to establish care.  He was complaining of chest pain that radiated into his left arm and palpitations.  Nuclear stress test and Holter monitor was ordered.  Patient underwent nuclear stress test which returned abnormal.  Holter monitor was never completed.  Cardiac catheterization was completed 12/26/2018 which revealed overall normal coronary anatomy with an estimated ejection fraction of 55%.  Dr. Francie Massing recommended TEE/DC cardioversion as he was in atrial fibrillation/atrial flutter.  TEE was completed revealing an EF of 55-60%.  No evidence of thrombus was identified.  The patient was successfully cardioverted to normal sinus rhythm and initiated on flecainide.  When the patient presented for follow-up following TEE/DC cardioversion he was back in atrial fibrillation.  The patient wished to continue with rate control and anticoagulation.  His flecainide was discontinued.  Since then, he did convert back to normal sinus and his flecainide  was re-initiated.  He has continued to go in and out of atrial fibrillation/flutter since then.     A 7 day event monitor ordered incomplete 06/11/2021 due to symptoms of "heart flutters" revealed rare ventricular ectopic beats with no significant complex ectopy.  No cardiac arrhythmias we were identified.  Minimal heart rate was 40 beats per minute with maximum heart rate of 82 beats per minute and average heart rate 53.     He presents to the clinic today for routine cardiology office follow-up visit.  He is doing well from a cardiac perspective.  He voices no acute cardiac concerns today.  Denies chest pain, shortness of breath, lightheadedness, dizziness, syncope or near-syncope.      Current Outpatient Medications   Medication Sig    acetaminophen (TYLENOL) 500 mg Oral Tablet Take 1 Tablet (500 mg total) by mouth Every 4 hours as needed for Pain (pt takes tylenol pm)    alfuzosin (UROXATRAL) 10 mg Oral Tablet Sustained Release 24 hr TAKE ONE TABLET BY MOUTH EVERY DAY    apixaban (ELIQUIS) 5 mg Oral Tablet Take 1 Tablet (5 mg total) by mouth Twice daily SHIP TO PATIENT,   [id number]  STATE LICENSE- [id number]  NPI- [id number]    diltiazem HCl (TIAZAC) 240 mg Oral Capsule,Sustained Action 24 hr TAKE ONE CAPSULE BY MOUTH DAILY    finasteride (PROSCAR) 5 mg Oral Tablet TAKE 1 TABLET EVERY DAY    flecainide (TAMBOCOR) 100 mg Oral Tablet  TAKE ONE TABLET BY MOUTH TWICE DAILY    furosemide (LASIX) 40 mg Oral Tablet TAKE ONE TABLET BY MOUTH EVERY DAY    losartan (COZAAR) 50 mg Oral Tablet Take 1 Tablet (50 mg total) by mouth Once a day    nitroGLYCERIN (NITROSTAT) 0.4 mg Sublingual Tablet, Sublingual Place 1 Tablet (0.4 mg total) under the tongue Every 5 minutes as needed for Chest pain    potassium chloride (KLOR-CON) 10 mEq Oral Tablet Sustained Release TAKE ONE TABLET BY MOUTH EVERY DAY    rosuvastatin (CRESTOR) 10 mg Oral Tablet Take 1 Tablet (10 mg total) by mouth Every evening    SPIRIVA WITH HANDIHALER 18 mcg  Inhalation Capsule, w/Inhalation Device inhale contents of ONE capsule EVERY DAY AS DIRECTED    tiotropium bromide (SPIRIVA HANDIHALER) 18 mcg Inhalation Capsule, w/Inhalation Device Take 1 Capsule (18 mcg total) by inhalation Once a day       No Known Allergies  Family Medical History:       Problem Relation (Age of Onset)    Cancer Mother    Hypertension (High Blood Pressure) Mother, Father            Social History     Socioeconomic History    Marital status: Widowed   Tobacco Use    Smoking status: Former     Types: Cigars    Smokeless tobacco: Current     Types: Chew, Snuff   Vaping Use    Vaping status: Never Used   Substance and Sexual Activity    Alcohol use: Yes     Alcohol/week: 2.0 standard drinks of alcohol     Types: 2 Cans of beer per week     Comment: few days a week    Drug use: Never    Sexual activity: Not Currently     Social Determinants of Health     Financial Resource Strain: Low Risk  (03/24/2023)    Financial Resource Strain     SDOH Financial: No   Transportation Needs: Low Risk  (03/24/2023)    Transportation Needs     SDOH Transportation: No   Social Connections: Medium Risk (03/24/2023)    Social Connections     SDOH Social Isolation: 3 to 5 times a week   Intimate Partner Violence: Low Risk  (03/24/2023)    Intimate Partner Violence     SDOH Domestic Violence: No   Housing Stability: Low Risk  (03/24/2023)    Housing Stability     SDOH Housing Situation: I have housing.     SDOH Housing Worry: No         Physical Exam:  BP 118/74   Pulse 74   Ht 1.803 m (5\' 11" )   Wt 113 kg (249 lb)   SpO2 97%   BMI 34.73 kg/m       Body mass index is 34.73 kg/m.  Wt Readings from Last 5 Encounters:   03/24/23 113 kg (249 lb)   12/11/22 109 kg (240 lb)   12/02/22 109 kg (240 lb 3.2 oz)   11/06/22 108 kg (237 lb)   10/16/22 108 kg (237 lb)       General:  Well-appearing adult male sitting in bedside chair, no apparent distress, Awake alert oriented  Neck: Strong Carotid pulse, No carotid bruit,   Heart:   Regular rate and rhythm without murmurs,rubs, or gallops  Lungs:  Clear to auscultation without rales wheezes or rhonchi  Extremities:  No peripheral edema, bilateral lower  extremities warm to touch, palpable bilateral posterior tibialis pulses  Skin:  Warm, dry    PERTINENT DIAGNOSTICS: EKG performed in the office today shows sinus bradycardia first-degree block 52 beats per minute    CMP reviewed from October 09, 2022 show sodium 142, potassium 3.6, chloride 106, BUN 20, creatinine 0.91    CBC reviewed from October 09, 2022 shows white blood cell 8.9, hemoglobin 13.8, hematocrit 42.4, platelets 155    Lab Results   Component Value Date    CHOLESTEROL 130 11/19/2022    HDLCHOL 71 11/19/2022    LDLCHOL 48 11/19/2022    TRIG 45 11/19/2022         IMPRESSION:  Assessment/Plan   1. Atrial flutter (CMS HCC)    2. Essential hypertension    3. Mixed hyperlipidemia          PLAN:    1. Atrial flutter paroxysmal:  Stable and remains in sinus rhythm, continue Cardizem, flecainide, Eliquis        2. Essential hypertension:  Stable well controlled, Continue losartan     3. Mixed hyperlipidemia:  Well controlled as documented above, continue simvastatin    Overall this patient seems stable from a cardiac perspective.  I have reviewed the current cardiac regimen and made no adjustments at this time.  I will plan to have the pt return to office for routine cardiac office follow up visit in 6 -8 months.  The Patient is advised to call our office sooner for any acute cardiac concerns.      Chart sent to Dr. Francie Massing for review.  He was readily available at time of office visit for discussion regarding this patient  Orders Placed This Encounter    [facility], In [facility], Same Day ([facility])

## 2023-03-25 ENCOUNTER — Inpatient Hospital Stay (HOSPITAL_COMMUNITY): Payer: No Typology Code available for payment source

## 2023-03-25 DIAGNOSIS — I10 Essential (primary) hypertension: Secondary | ICD-10-CM | POA: Diagnosis not present

## 2023-03-25 DIAGNOSIS — J690 Pneumonitis due to inhalation of food and vomit: Secondary | ICD-10-CM | POA: Diagnosis not present

## 2023-03-25 DIAGNOSIS — N1832 Chronic kidney disease, stage 3b: Secondary | ICD-10-CM | POA: Diagnosis not present

## 2023-03-25 DIAGNOSIS — I480 Paroxysmal atrial fibrillation: Secondary | ICD-10-CM | POA: Diagnosis not present

## 2023-03-25 LAB — CBC WITH DIFFERENTIAL/PLATELET
Abs Immature Granulocytes: 0.15 10*3/uL — ABNORMAL HIGH (ref 0.00–0.07)
Basophils Absolute: 0.1 10*3/uL (ref 0.0–0.1)
Basophils Relative: 0 %
Eosinophils Absolute: 0.2 10*3/uL (ref 0.0–0.5)
Eosinophils Relative: 1 %
HCT: 41.9 % (ref 39.0–52.0)
Hemoglobin: 12.5 g/dL — ABNORMAL LOW (ref 13.0–17.0)
Immature Granulocytes: 1 %
Lymphocytes Relative: 10 %
Lymphs Abs: 1.4 10*3/uL (ref 0.7–4.0)
MCH: 23.8 pg — ABNORMAL LOW (ref 26.0–34.0)
MCHC: 29.8 g/dL — ABNORMAL LOW (ref 30.0–36.0)
MCV: 79.7 fL — ABNORMAL LOW (ref 80.0–100.0)
Monocytes Absolute: 1.4 10*3/uL — ABNORMAL HIGH (ref 0.1–1.0)
Monocytes Relative: 10 %
Neutro Abs: 10.2 10*3/uL — ABNORMAL HIGH (ref 1.7–7.7)
Neutrophils Relative %: 78 %
Platelets: 306 10*3/uL (ref 150–400)
RBC: 5.26 MIL/uL (ref 4.22–5.81)
RDW: 17.1 % — ABNORMAL HIGH (ref 11.5–15.5)
WBC: 13.4 10*3/uL — ABNORMAL HIGH (ref 4.0–10.5)
nRBC: 0 % (ref 0.0–0.2)

## 2023-03-25 LAB — GLUCOSE, CAPILLARY
Glucose-Capillary: 95 mg/dL (ref 70–99)
Glucose-Capillary: 113 mg/dL — ABNORMAL HIGH (ref 70–99)
Glucose-Capillary: 86 mg/dL (ref 70–99)

## 2023-03-25 LAB — BASIC METABOLIC PANEL
Anion gap: 15 (ref 5–15)
BUN: 31 mg/dL — ABNORMAL HIGH (ref 8–23)
CO2: 28 mmol/L (ref 22–32)
Calcium: 9.2 mg/dL (ref 8.9–10.3)
Chloride: 97 mmol/L — ABNORMAL LOW (ref 98–111)
Creatinine, Ser: 1.79 mg/dL — ABNORMAL HIGH (ref 0.61–1.24)
GFR, Estimated: 38 mL/min — ABNORMAL LOW (ref >60)
Glucose, Bld: 97 mg/dL (ref 70–99)
Potassium: 3.8 mmol/L (ref 3.5–5.1)
Sodium: 140 mmol/L (ref 135–145)

## 2023-03-25 NOTE — Plan of Care (Signed)
  Problem: Education: Goal: Individualized Educational Video(s) Outcome: Progressing   Problem: Activity: Goal: Ability to avoid complications of mobility impairment will improve Outcome: Progressing Goal: Range of joint motion will improve Outcome: Progressing   Problem: Clinical Measurements: Goal: Postoperative complications will be avoided or minimized Outcome: Progressing   Problem: Pain Management: Goal: Pain level will decrease with appropriate interventions Outcome: Progressing   Problem: Skin Integrity: Goal: Will show signs of wound healing Outcome: Progressing   Problem: Coping: Goal: Ability to adjust to condition or change in health will improve Outcome: Progressing   Problem: Health Behavior/Discharge Planning: Goal: Ability to identify and utilize available resources and services will improve Outcome: Progressing Goal: Ability to manage health-related needs will improve Outcome: Progressing   Problem: Nutritional: Goal: Maintenance of adequate nutrition will improve Outcome: Progressing Goal: Progress toward achieving an optimal weight will improve Outcome: Progressing   Problem: Activity: Goal: Risk for activity intolerance will decrease Outcome: Progressing

## 2023-03-25 NOTE — Plan of Care (Signed)
  Problem: Skin Integrity: Goal: Will show signs of wound healing Outcome: Progressing   

## 2023-03-25 NOTE — Progress Notes (Signed)
Occupational Therapy Treatment Patient Details Name: Tanner [name] MRN: [id number] DOB: 11-Jun-1946 Today's Date: 03/25/2023   History of present illness 77 yo male presents to [facility] on 10/3 with AMS, fall, SOB. R knee xray shows large effusion. Pt with sepsis due to bibasilar aspiration PNA with associated respiratory failure, RLE cellulitis. PMH includes R TKR 12/15/2022, L TKR 2022, anxiety, BPH, HF, depression, DMII, DVT, HTN, PE, afib, CKDIII.   OT comments  Pt in chair upon arrival, reports having been up since breakfast. Min assist to stand with RW and pivot to bed. Total assist needed for posterior pericare in standing. Pt completed grooming with set up at EOB and UB dressing with min assist. Mod assist to return to supine. Pt with insight into needing to go to ALF after rehab. Patient will benefit from continued inpatient follow up therapy, <3 hours/day.       If plan is discharge home, recommend the following:  A lot of help with bathing/dressing/bathroom;Assistance with cooking/housework;Assistance with feeding;Assist for transportation;Help with stairs or ramp for entrance;A little help with walking and/or transfers   Equipment Recommendations  BSC/3in1    Recommendations for Other Services      Precautions / Restrictions Precautions Precautions: Fall Restrictions Weight Bearing Restrictions: No       Mobility Bed Mobility Overal bed mobility: Needs Assistance Bed Mobility: Sit to Supine, Rolling Rolling: Min assist     Sit to supine: Mod assist   General bed mobility comments: assist for LEs into bed, pt rolled to offload buttocks    Transfers Overall transfer level: Needs assistance Equipment used: Rolling walker (2 wheels) Transfers: Sit to/from Stand, Bed to chair/wheelchair/BSC Sit to Stand: Min assist     Step pivot transfers: Min assist     General transfer comment: heavy reliance on UEs, slow to rise, cues for safety with sit to stand     Balance  Overall balance assessment: Needs assistance Sitting-balance support: No upper extremity supported, Feet supported Sitting balance-Leahy Scale: Fair Sitting balance - Comments: no LOB while grooming or changing gown   Standing balance support: Bilateral upper extremity supported, During functional activity Standing balance-Leahy Scale: Poor                             ADL either performed or assessed with clinical judgement   ADL Overall ADL's : Needs assistance/impaired     Grooming: Wash/dry hands;Wash/dry face;Sitting;Set up           Upper Body Dressing : Minimal assistance;Sitting Upper Body Dressing Details (indicate cue type and reason): changed soiled gown         Toileting- Clothing Manipulation and Hygiene: Total assistance;Sit to/from stand Toileting - Clothing Manipulation Details (indicate cue type and reason): small amount of BM on chair pad, posterior pericare provided            Extremity/Trunk Assessment              Vision       Perception     Praxis      Cognition Arousal: Alert Behavior During Therapy: WFL for tasks assessed/performed Overall Cognitive Status: Impaired/Different from baseline Area of Impairment: Memory, Following commands                     Memory: Decreased short-term memory Following Commands: Follows one step commands with increased time       General Comments: pt aggravated he is  still not at rehab, states he is considering going to [facility] AL[facility] after rehab        Exercises      Shoulder Instructions       General Comments      Pertinent Vitals/ Pain       Pain Assessment Pain Assessment: Faces Faces Pain Scale: Hurts little more Pain Location: buttocks Pain Descriptors / Indicators: Sore Pain Intervention(s): Repositioned  Home Living                                          Prior Functioning/Environment              Frequency  Min 1X/week         Progress Toward Goals  OT Goals(current goals can now be found in the care plan section)  Progress towards OT goals: Progressing toward goals  Acute Rehab OT Goals OT Goal Formulation: With patient Time For Goal Achievement: 04/02/23 Potential to Achieve Goals: Good  Plan      Co-evaluation                 AM-PAC OT "6 Clicks" Daily Activity     Outcome Measure   Help from another person eating meals?: A Little Help from another person taking care of personal grooming?: A Little Help from another person toileting, which includes using toliet, bedpan, or urinal?: Total Help from another person bathing (including washing, rinsing, drying)?: A Lot Help from another person to put on and taking off regular upper body clothing?: A Little Help from another person to put on and taking off regular lower body clothing?: Total 6 Click Score: 13    End of Session Equipment Utilized During Treatment: Gait belt;Rolling walker (2 wheels)  OT Visit Diagnosis: Unsteadiness on feet (R26.81);Other abnormalities of gait and mobility (R26.89);Pain;Muscle weakness (generalized) (M62.81);Other symptoms and signs involving cognitive function   Activity Tolerance Patient tolerated treatment well   Patient Left in bed;with call bell/phone within reach;with bed alarm set   Nurse Communication Mobility status        Time: 0454-0981 OT Time Calculation (min): 31 min  Charges: OT General Charges $OT Visit: 1 Visit OT Treatments $Self Care/Home Management : 23-37 mins  Berna Spare, OTR/L Acute Rehabilitation Services Office: [phone]   Evern Bio 03/25/2023, 1:07 PM

## 2023-03-25 NOTE — Progress Notes (Signed)
PROGRESS NOTE  Tanner [name] XBJ:[id number] DOB: Jan 13, 1946   PCP: Myrlene Broker, MD  Patient is from: Home.  Uses rolling walker at baseline.  DOA: 03/18/2023 LOS: 7  Chief complaints Chief Complaint  Patient presents with   Altered Mental Status     Brief Narrative / Interim history: 77 year old M with PMH of PE/PAF on Eliquis, HTN, HLD, HFmrEF, CKD-3A, OSA and recent right TKA by Dr. Charlann Boxer presented to ED with altered mental status, productive cough, myalgia and hypoxemia to 80s per EMS, and admitted for acute respiratory failure with hypoxia due to pneumonia.  CXR showed bibasilar opacities, right > left.  He also had significant right knee effusion with RLE swelling and erythema. Patient met criteria for severe sepsis due to aspiration pneumonia.  Blood cultures NGTD.  Encephalopathy, respiratory symptoms and right lower extremity cellulitis/swelling improved.  Antibiotics de-escalated to p.o. Augmentin and Zithromax.  Therapy recommended SNF.  Medically stable for discharge.     Subjective: Overnight, pt called the cops about being held in the hospital. Still waiting on insurance authorization. Patient denies any new complaints, no fever/chills, cough, abd pain, dysuria.    Objective: Vitals:   03/24/23 1435 03/24/23 2105 03/25/23 0503 03/25/23 0900  BP: (!) 157/60 (!) 151/55 (!) 150/57 (!) 148/60  Pulse: 67 62 63 66  Resp: 20 18 18 18   Temp: 98 F (36.7 C) 97.8 F (36.6 C) 97.6 F (36.4 C) 98 F (36.7 C)  TempSrc:    Oral  SpO2: 94% 91% 90% 92%  Weight:      Height:        Examination: General: NAD, chronically ill appearing  Cardiovascular: S1, S2 present Respiratory: Diminished air entry bilaterally  Abdomen: Soft, nontender, nondistended, bowel sounds present Musculoskeletal: No bilateral pedal edema noted Skin: Normal Psychiatry: Normal mood    Procedures:  None  Microbiology summarized: COVID-19, influenza and RSV PCR nonreactive Blood  cultures NGTD  Assessment and plan: Principal Problem:   Aspiration pneumonia (HCC) Active Problems:   Essential hypertension   Chronic kidney disease (CKD), stage III (moderate) (HCC)   History of pulmonary embolus (PE)   OSA (obstructive sleep apnea)   Morbid obesity (HCC)   Chronic systolic heart failure (HCC)   Paroxysmal atrial fibrillation (HCC)   S/P total knee arthroplasty, right   Pressure injury of skin   Severe sepsis (HCC)   Severe sepsis due to bibasilar aspiration pneumonia and RLE cellulitis: POA Had leukocytosis, tachypnea, respiratory failure and mental status change on presentation Concern for aspiration pneumonia versus CAP given mental status change and risk for aspiration Blood cultures NGTD Still with persistent leukocytosis, but no symptoms of infection  Repeat CXR with possible atelectasis Vs infiltrate, will monitor S/p IV ceftriaoxne/azithromycin, completed total of 7 days course of AB SLP recommended dysphagia 3 diet Aspiration precaution Pulmonary toilet PT/OT-recommended SNF.  TOC working on placement.   Acute hypoxic respiratory failure secondary to the above Resolved Remains on room air Intensity spirometry Further management as above  Generalized weakness with fall Reportedly fell 1 week ago.  No apparent trauma but significant right knee effusion SNF  RLE edema/cellulitis post fall Right knee x-ray showed large effusion.  LE venous Doppler negative for DVT.  History of right TKA in 12/2022 by Dr. Charlann Boxer Large right knee effusion Orthopedic surgery consulted and did not feel he needs aspiration.  Recommended outpatient follow-up with Dr. Charlann Boxer Antibiotics as above Fall precaution PT/OT   Acute metabolic encephalopathy Resolved Multifactorial including severe  sepsis, respiratory failure and polypharmacy No focal neurodeficit on exam.  UDS, ammonia, B12, VBG, TSH and CT head unrevealing.   Reorientation and delirium  precaution Discontinued sedating medication  Chronic HFmrEF TTE in 08/2022 with LVEF of 45 to 50%, GH, indeterminate DD, and normal RV SF Appears euvolemic Held torsemide due to rise in Cr Continue home Jardiance. Strict intake and output, daily weights, renal functions and electrolytes   DM-2 with hyperglycemia and hypoglycemia A1c 5.3, On Jardiance.  No longer on Ozempic Discontinued CBG monitoring and SSI.  Paroxysmal A-fib Rate controlled Continue home Coreg, amiodarone and Eliquis Optimize electrolytes  OSA not on CPAP  CKD-3B Stable Continue monitoring   Hypertension BP improved. Continue home Coreg  Obesity: Elevated BMI with comorbidity as above Body mass index is 38.01 kg/m.  Pressure skin injury: POA Pressure Injury 03/18/23 Buttocks Left;Right Stage 2 -  Partial thickness loss of dermis presenting as a shallow open injury with a red, pink wound bed without slough. (Active)  03/18/23 0629  Location: Buttocks  Location Orientation: Left;Right  Staging: Stage 2 -  Partial thickness loss of dermis presenting as a shallow open injury with a red, pink wound bed without slough.  Wound Description (Comments):   Present on Admission: Yes  Dressing Type Foam - Lift dressing to assess site every shift 03/25/23 0800   DVT prophylaxis:   apixaban (ELIQUIS) tablet 5 mg  Code Status: Full code Family Communication: None at baseline Level of care: Med-Surg Status is: Inpatient Remains inpatient appropriate because: Severe sepsis due to aspiration pneumonia and right lower extremity cellulitis   Final disposition: SNF.  Medically stable for discharge.   Consultants:  Orthopedic surgery     Sch Meds:  Scheduled Meds:  amiodarone  200 mg Oral Daily   apixaban  5 mg Oral BID   atorvastatin  80 mg Oral Daily   buPROPion  150 mg Oral BID   carvedilol  6.25 mg Oral BID WC   empagliflozin  25 mg Oral Daily   finasteride  5 mg Oral QHS   mouth rinse  15 mL  Mouth Rinse 4 times per day   Continuous Infusions:   PRN Meds:.acetaminophen, guaiFENesin, mouth rinse, mouth rinse, polyethylene glycol  Antimicrobials: Anti-infectives (From admission, onward)    Start     Dose/Rate Route Frequency Ordered Stop   03/23/23 0000  amoxicillin-clavulanate (AUGMENTIN) 875-125 MG tablet        1 tablet Oral Every 12 hours 03/23/23 1129 03/26/23 2359   03/20/23 2200  amoxicillin-clavulanate (AUGMENTIN) 875-125 MG per tablet 1 tablet        1 tablet Oral Every 12 hours 03/20/23 1014 03/24/23 2133   03/20/23 1000  amoxicillin-clavulanate (AUGMENTIN) 875-125 MG per tablet 1 tablet  Status:  Discontinued        1 tablet Oral Every 12 hours 03/20/23 0710 03/20/23 1014   03/19/23 2200  azithromycin (ZITHROMAX) tablet 500 mg  Status:  Discontinued        500 mg Oral Daily 03/19/23 1026 03/20/23 1015   03/18/23 2200  azithromycin (ZITHROMAX) 500 mg in sodium chloride 0.9 % 250 mL IVPB  Status:  Discontinued        500 mg 250 mL/hr over 60 Minutes Intravenous Every 24 hours 03/18/23 0543 03/19/23 1026   03/18/23 1000  cefTRIAXone (ROCEPHIN) 2 g in sodium chloride 0.9 % 100 mL IVPB  Status:  Discontinued        2 g 200 mL/hr over 30 Minutes  Intravenous Every 24 hours 03/18/23 0543 03/20/23 0710   03/18/23 0230  cefTRIAXone (ROCEPHIN) 1 g in sodium chloride 0.9 % 100 mL IVPB        1 g 200 mL/hr over 30 Minutes Intravenous  Once 03/18/23 0228 03/18/23 0327   03/18/23 0230  azithromycin (ZITHROMAX) 500 mg in sodium chloride 0.9 % 250 mL IVPB        500 mg 250 mL/hr over 60 Minutes Intravenous  Once 03/18/23 0228 03/18/23 0402        I have personally reviewed the following labs and images: CBC: Recent Labs  Lab 03/20/23 1115 03/21/23 0635 03/22/23 0921 03/23/23 0420 03/25/23 0530  WBC 10.4 10.2 12.3* 11.8* 13.4*  NEUTROABS  --   --   --   --  10.2*  HGB 11.4* 11.1* 12.3* 12.4* 12.5*  HCT 38.8* 35.7* 41.1 41.3 41.9  MCV 82.6 80.4 79.5* 78.5* 79.7*   PLT 234 197 243 265 306   BMP &GFR Recent Labs  Lab 03/19/23 0758 03/20/23 1115 03/21/23 0635 03/22/23 0921 03/23/23 0420 03/23/23 1346 03/25/23 0530  NA 137 136 141 136 140 139 140  K 4.2 4.3 3.9 3.9 3.0* 3.5 3.8  CL 104 101 97* 96* 95* 90* 97*  CO2 23 25 27 25  32 31 28  GLUCOSE 87 97 119* 154* 106* 123* 97  BUN 20 17 22  27* 29* 32* 31*  CREATININE 1.71* 1.46* 1.76* 1.79* 1.82* 1.92* 1.79*  CALCIUM 8.6* 8.5* 8.9 8.5* 8.6* 8.9 9.2  MG 2.1 2.2 2.0 2.3 2.2  --   --   PHOS  --  3.4 3.6 3.7 4.1 3.7  --    Estimated Creatinine Clearance: 38.3 mL/min (A) (by C-G formula based on SCr of 1.79 mg/dL (H)). Liver & Pancreas: Recent Labs  Lab 03/20/23 1115 03/21/23 0635 03/22/23 0921 03/23/23 0420 03/23/23 1346  ALBUMIN 2.5* 2.4* 2.6* 2.4* 2.6*   No results for input(s): "LIPASE", "AMYLASE" in the last 168 hours. Recent Labs  Lab 03/18/23 1831  AMMONIA 19   Diabetic: No results for input(s): "HGBA1C" in the last 72 hours.  Recent Labs  Lab 03/19/23 1653 03/20/23 0713 03/21/23 2057 03/25/23 0721 03/25/23 1215  GLUCAP 87 89 120* 95 113*   Cardiac Enzymes: No results for input(s): "CKTOTAL", "CKMB", "CKMBINDEX", "TROPONINI" in the last 168 hours. No results for input(s): "PROBNP" in the last 8760 hours. Coagulation Profile: No results for input(s): "INR", "PROTIME" in the last 168 hours.  Thyroid Function Tests: No results for input(s): "TSH", "T4TOTAL", "FREET4", "T3FREE", "THYROIDAB" in the last 72 hours.  Lipid Profile: No results for input(s): "CHOL", "HDL", "LDLCALC", "TRIG", "CHOLHDL", "LDLDIRECT" in the last 72 hours. Anemia Panel: No results for input(s): "VITAMINB12", "FOLATE", "FERRITIN", "TIBC", "IRON", "RETICCTPCT" in the last 72 hours.  Urine analysis:    Component Value Date/Time   COLORURINE YELLOW 03/18/2023 2213   APPEARANCEUR CLEAR 03/18/2023 2213   LABSPEC 1.017 03/18/2023 2213   PHURINE 5.0 03/18/2023 2213   GLUCOSEU >=500 (A) 03/18/2023  2213   GLUCOSEU NEGATIVE 09/13/2007 1028   HGBUR SMALL (A) 03/18/2023 2213   BILIRUBINUR NEGATIVE 03/18/2023 2213   BILIRUBINUR negative 04/19/2015 1417   BILIRUBINUR neg 02/12/2013 1449   KETONESUR NEGATIVE 03/18/2023 2213   PROTEINUR 100 (A) 03/18/2023 2213   UROBILINOGEN 0.2 04/19/2015 1417   UROBILINOGEN 0.2 mg/dL 06/23/3233 5732   NITRITE NEGATIVE 03/18/2023 2213   LEUKOCYTESUR SMALL (A) 03/18/2023 2213   Sepsis Labs: Invalid input(s): "PROCALCITONIN", "LACTICIDVEN"  Microbiology: Recent  Results (from the past 240 hour(s))  Blood Culture (routine x 2)     Status: None   Collection Time: 03/18/23  3:22 AM   Specimen: BLOOD RIGHT HAND  Result Value Ref Range Status   Specimen Description BLOOD RIGHT HAND  Final   Special Requests   Final    BOTTLES DRAWN AEROBIC AND ANAEROBIC Blood Culture results may not be optimal due to an inadequate volume of blood received in culture bottles   Culture   Final    NO GROWTH 5 DAYS Performed at [facility], [address], [place], [place] [address]    Report Status 03/23/2023 FINAL  Final  Resp panel by RT-PCR (RSV, Flu A&B, Covid) Anterior Nasal Swab     Status: None   Collection Time: 03/18/23  3:32 AM   Specimen: Anterior Nasal Swab  Result Value Ref Range Status   SARS Coronavirus 2 by RT PCR NEGATIVE NEGATIVE Final   Influenza A by PCR NEGATIVE NEGATIVE Final   Influenza B by PCR NEGATIVE NEGATIVE Final    Comment: (NOTE) The Xpert Xpress SARS-CoV-2/FLU/RSV plus assay is intended as an aid in the diagnosis of influenza from Nasopharyngeal swab specimens and should not be used as a sole basis for treatment. Nasal washings and aspirates are unacceptable for Xpert Xpress SARS-CoV-2/FLU/RSV testing.  Fact Sheet for Patients: BloggerCourse.com  Fact Sheet for Healthcare Providers: SeriousBroker.it  This test is not yet approved or cleared by the [place] FDA and has  been authorized for detection and/or diagnosis of SARS-CoV-2 by FDA under an Emergency Use Authorization (EUA). This EUA will remain in effect (meaning this test can be used) for the duration of the COVID-19 declaration under Section 564(b)(1) of the Act, 21 U.S.C. section 360bbb-3(b)(1), unless the authorization is terminated or revoked.     Resp Syncytial Virus by PCR NEGATIVE NEGATIVE Final    Comment: (NOTE) Fact Sheet for Patients: BloggerCourse.com  Fact Sheet for Healthcare Providers: SeriousBroker.it  This test is not yet approved or cleared by the [place] FDA and has been authorized for detection and/or diagnosis of SARS-CoV-2 by FDA under an Emergency Use Authorization (EUA). This EUA will remain in effect (meaning this test can be used) for the duration of the COVID-19 declaration under Section 564(b)(1) of the Act, 21 U.S.C. section 360bbb-3(b)(1), unless the authorization is terminated or revoked.  Performed at [facility], [address], [place], [place] [address]   Blood Culture (routine x 2)     Status: None   Collection Time: 03/18/23  8:42 AM   Specimen: BLOOD LEFT HAND  Result Value Ref Range Status   Specimen Description BLOOD LEFT HAND  Final   Special Requests   Final    BOTTLES DRAWN AEROBIC AND ANAEROBIC Blood Culture results may not be optimal due to an inadequate volume of blood received in culture bottles   Culture   Final    NO GROWTH 5 DAYS Performed at [facility], [address] [place], [place] [address]    Report Status 03/23/2023 FINAL  Final    Radiology Studies: DG CHEST PORT 1 VIEW  Result Date: 03/25/2023 CLINICAL DATA:  Leukocytosis EXAM: PORTABLE CHEST 1 VIEW COMPARISON:  CXR 03/18/23 FINDINGS: Pleural effusion. No pneumothorax. Normal cardiac and mediastinal contours. Hazy bibasilar airspace opacities could represent atelectasis infection. Visualized upper  abdomen is unremarkable. No radiographically apparent displaced rib fractures. IMPRESSION: Hazy bibasilar airspace opacities could represent atelectasis or infection. Electronically  Signed   By: Lorenza Cambridge M.D.   On: 03/25/2023 12:35      Briant Cedar, MD [facility]  If 7PM-7AM, please contact night-coverage [email or web address] 03/25/2023, 3:17 PM

## 2023-03-25 NOTE — Progress Notes (Signed)
SATURATION QUALIFICATIONS: (This note is used to comply with regulatory documentation for home oxygen)  Patient Saturations on Room Air at Rest = 91%  Patient Saturations on Room Air while Ambulating = 87%  Patient Saturations on 1 Liters of oxygen while Resting = 92%  Please briefly explain why patient needs home oxygen: Pt hypoxic on RA with exertion.

## 2023-03-25 NOTE — TOC Progression Note (Signed)
Transition of Care [facility]) - Progression Note    Patient Details  Name: Tanner [name] MRN: [id number] Date of Birth: September 18, 1945  Transition of Care [facility]) CM/SW Contact  Baldemar Len[place], [place] Phone Number: 03/25/2023, 4:02 PM  Clinical Narrative:   CSW checked on insurance authorization throughout the day today and spoke with Luisa Hart to keep him updated on status. Authorization finally received, but [facility] pharmacy closes at 4; unable to get patient's medications today for admission. CSW updated Luisa Hart that SNF can accept tomorrow, he is in agreement. CSW updated MD. CSW to follow.    Expected Discharge Plan: Skilled Nursing Facility Barriers to Discharge: Continued Medical Work up  Expected Discharge Plan and Services In-house Referral: Clinical Social Work   Post Acute Care Choice: Skilled Nursing Facility Living arrangements for the past 2 months: Single Family Home Expected Discharge Date: 03/23/23                                     Social Determinants of Health (SDOH) Interventions SDOH Screenings   Food Insecurity: No Food Insecurity (12/15/2022)  Housing: Low Risk  (12/15/2022)  Transportation Needs: No Transportation Needs (12/15/2022)  Utilities: Not At Risk (12/15/2022)  Alcohol Screen: Low Risk  (10/27/2022)  Depression (PHQ2-9): Low Risk  (01/04/2023)  Financial Resource Strain: Low Risk  (10/27/2022)  Physical Activity: Inactive (10/27/2022)  Social Connections: Moderately Integrated (10/27/2022)  Stress: No Stress Concern Present (12/31/2022)  Recent Concern: Stress - Stress Concern Present (10/30/2022)  Tobacco Use: Medium Risk (03/18/2023)    Readmission Risk Interventions    08/20/2022   11:10 AM  Readmission Risk Prevention Plan  Transportation Screening Complete  PCP or Specialist Appt within 3-5 Days Complete  HRI or Home Care Consult Complete  Palliative Care Screening Not Applicable  Medication Review (RN Care Manager) Complete

## 2023-03-25 NOTE — Progress Notes (Signed)
Physical Therapy Treatment Patient Details Name: Tanner [name] MRN: [id number] DOB: [birth year] Today's Date: 03/25/2023   History of Present Illness 77 yo male presents to [facility] on 10/3 with AMS, fall, SOB. R knee xray shows large effusion. Pt with sepsis due to bibasilar aspiration PNA with associated respiratory failure, RLE cellulitis. PMH includes R TKR 12/15/2022, L TKR 2022, anxiety, BPH, HF, depression, DMII, DVT, HTN, PE, afib, CKDIII.    PT Comments  Pt received in supine, agreeable to therapy session and with excellent participation and improved tolerance for transfer training. Pt able to initiate gait trials short distances in room (to bathroom, then to chair) but close chair follow for safety. Pt slightly hypoxic on RA with exertion (SpO2 87% with activity but with seated break improved to 88-91% on RA). Ok per RN to place pt back on 1L O2 [place] while sitting in chair and preparing to eat at end of session. Pt given geomat cushion for chair to reduce pressure given skin breakdown. Pt continues to benefit from PT services to progress toward functional mobility goals.     If plan is discharge home, recommend the following: Two people to help with walking and/or transfers;Two people to help with bathing/dressing/bathroom   Can travel by private vehicle     Yes  Equipment Recommendations  None recommended by PT    Recommendations for Other Services       Precautions / Restrictions Precautions Precautions: Fall Restrictions Weight Bearing Restrictions: No     Mobility  Bed Mobility Overal bed mobility: Needs Assistance Bed Mobility: Rolling, Supine to Sit Rolling: Min assist   Supine to sit: Min assist, HOB elevated, Used rails     General bed mobility comments: to R EOB, use of rail    Transfers Overall transfer level: Needs assistance Equipment used: Rolling walker (2 wheels) Transfers: Sit to/from Stand, Bed to chair/wheelchair/BSC Sit to Stand: Min assist            General transfer comment: from EOB and to/from [facility], then to chair. Mod cues for sequencing and BUE placement.    Ambulation/Gait Ambulation/Gait assistance: Min assist, +2 safety/equipment Gait Distance (Feet): 15 Feet (32ft, seated break, 87ft) Assistive device: Rolling walker (2 wheels) Gait Pattern/deviations: Step-to pattern, Decreased stride length, Knee flexed in stance - right, Knee flexed in stance - left, Trunk flexed, Decreased dorsiflexion - right, Decreased dorsiflexion - left   Gait velocity interpretation: <1.31 ft/sec, indicative of household ambulator   General Gait Details: pt with improved upright posture in RW today but needs cues to maintain; some posterior instability when turning and with backward stepping. Pt quick to fatigue but with chair follow able to progress to short distance in room. too fatigued to walk all the way back to chair from bathroom after toileting so chair pulled closer for him to sit.   Stairs             Wheelchair Mobility     Tilt Bed    Modified Rankin (Stroke Patients Only)       Balance Overall balance assessment: Needs assistance Sitting-balance support: No upper extremity supported, Feet supported Sitting balance-Leahy Scale: Fair Sitting balance - Comments: EOB   Standing balance support: Bilateral upper extremity supported, During functional activity Standing balance-Leahy Scale: Poor Standing balance comment: reliant on RW                            Cognition Arousal: Alert Behavior  During Therapy: WFL for tasks assessed/performed Overall Cognitive Status: Impaired/Different from baseline Area of Impairment: Memory, Following commands                     Memory: Decreased short-term memory Following Commands: Follows one step commands with increased time       General Comments: Cooperative, some fear of falls/anxiety about mobiltiy but progresses well with encouragement.         Exercises General Exercises - Lower Extremity Ankle Circles/Pumps: AROM, Both, Supine, 10 reps    General Comments General comments (skin integrity, edema, etc.): skin breakdown on bottom so PTA brought geomat cushion to his room and placed under him in chair; RN notified he may need barrier ointment; SpO2 87% with exertion on RA, SpO2 WFL on 1L O2 [place] at rest in chair      Pertinent Vitals/Pain Pain Assessment Pain Assessment: Faces Faces Pain Scale: Hurts a little bit Pain Location: buttocks Pain Descriptors / Indicators: Sore (with peri care) Pain Intervention(s): Monitored during session, Repositioned, Limited activity within patient's tolerance    Home Living                          Prior Function            PT Goals (current goals can now be found in the care plan section) Acute Rehab PT Goals Patient Stated Goal: to feel stronger PT Goal Formulation: With patient Time For Goal Achievement: 04/01/23 Progress towards PT goals: Progressing toward goals    Frequency    Min 1X/week      PT Plan      Co-evaluation              AM-PAC PT "6 Clicks" Mobility   Outcome Measure  Help needed turning from your back to your side while in a flat bed without using bedrails?: A Little Help needed moving from lying on your back to sitting on the side of a flat bed without using bedrails?: A Lot Help needed moving to and from a bed to a chair (including a wheelchair)?: A Lot Help needed standing up from a chair using your arms (e.g., wheelchair or bedside chair)?: A Little Help needed to walk in hospital room?: A Lot Help needed climbing 3-5 steps with a railing? : Total 6 Click Score: 13    End of Session Equipment Utilized During Treatment: Gait belt;Oxygen (1L O2 [place] placed after transfer to chair, SpO2 desat with ambulation) Activity Tolerance: Patient tolerated treatment well Patient left: in chair;with call bell/phone within reach;with chair alarm  set Nurse Communication: Mobility status;Other (comment) (bottom pain, SpO2) PT Visit Diagnosis: Other abnormalities of gait and mobility (R26.89);Muscle weakness (generalized) (M62.81)     Time: 7425-9563 PT Time Calculation (min) (ACUTE ONLY): 29 min  Charges:    $Gait Training: 8-22 mins $Therapeutic Activity: 8-22 mins PT General Charges $$ ACUTE PT VISIT: 1 Visit                     Alanie Syler P., PTA Acute Rehabilitation Services Secure Chat Preferred 9a-5:30pm Office: [phone]    Dorathy Kinsman [facility] 03/25/2023, 6:32 PM

## 2023-03-26 DIAGNOSIS — J9601 Acute respiratory failure with hypoxia: Secondary | ICD-10-CM | POA: Diagnosis not present

## 2023-03-26 DIAGNOSIS — G4733 Obstructive sleep apnea (adult) (pediatric): Secondary | ICD-10-CM | POA: Diagnosis not present

## 2023-03-26 DIAGNOSIS — A419 Sepsis, unspecified organism: Secondary | ICD-10-CM | POA: Diagnosis not present

## 2023-03-26 DIAGNOSIS — I5022 Chronic systolic (congestive) heart failure: Secondary | ICD-10-CM | POA: Diagnosis not present

## 2023-03-26 DIAGNOSIS — J690 Pneumonitis due to inhalation of food and vomit: Secondary | ICD-10-CM | POA: Diagnosis not present

## 2023-03-26 DIAGNOSIS — Z86711 Personal history of pulmonary embolism: Secondary | ICD-10-CM | POA: Diagnosis not present

## 2023-03-26 DIAGNOSIS — Z96651 Presence of right artificial knee joint: Secondary | ICD-10-CM | POA: Diagnosis not present

## 2023-03-26 DIAGNOSIS — I480 Paroxysmal atrial fibrillation: Secondary | ICD-10-CM | POA: Diagnosis not present

## 2023-03-26 DIAGNOSIS — L0390 Cellulitis, unspecified: Secondary | ICD-10-CM | POA: Diagnosis not present

## 2023-03-26 DIAGNOSIS — I10 Essential (primary) hypertension: Secondary | ICD-10-CM | POA: Diagnosis not present

## 2023-03-26 DIAGNOSIS — L89322 Pressure ulcer of left buttock, stage 2: Secondary | ICD-10-CM | POA: Diagnosis not present

## 2023-03-26 DIAGNOSIS — N183 Chronic kidney disease, stage 3 unspecified: Secondary | ICD-10-CM | POA: Diagnosis not present

## 2023-03-26 DIAGNOSIS — L988 Other specified disorders of the skin and subcutaneous tissue: Secondary | ICD-10-CM | POA: Diagnosis not present

## 2023-03-26 LAB — CBC WITH DIFFERENTIAL/PLATELET
Abs Immature Granulocytes: 0.2 10*3/uL — ABNORMAL HIGH (ref 0.00–0.07)
Basophils Absolute: 0.1 10*3/uL (ref 0.0–0.1)
Basophils Relative: 1 %
Eosinophils Absolute: 0.2 10*3/uL (ref 0.0–0.5)
Eosinophils Relative: 2 %
HCT: 41.1 % (ref 39.0–52.0)
Hemoglobin: 12.2 g/dL — ABNORMAL LOW (ref 13.0–17.0)
Immature Granulocytes: 2 %
Lymphocytes Relative: 15 %
Lymphs Abs: 1.9 10*3/uL (ref 0.7–4.0)
MCH: 23.3 pg — ABNORMAL LOW (ref 26.0–34.0)
MCHC: 29.7 g/dL — ABNORMAL LOW (ref 30.0–36.0)
MCV: 78.4 fL — ABNORMAL LOW (ref 80.0–100.0)
Monocytes Absolute: 1.1 10*3/uL — ABNORMAL HIGH (ref 0.1–1.0)
Monocytes Relative: 9 %
Neutro Abs: 9.3 10*3/uL — ABNORMAL HIGH (ref 1.7–7.7)
Neutrophils Relative %: 71 %
Platelets: 327 10*3/uL (ref 150–400)
RBC: 5.24 MIL/uL (ref 4.22–5.81)
RDW: 17.2 % — ABNORMAL HIGH (ref 11.5–15.5)
WBC: 12.7 10*3/uL — ABNORMAL HIGH (ref 4.0–10.5)
nRBC: 0 % (ref 0.0–0.2)

## 2023-03-26 LAB — BASIC METABOLIC PANEL
Anion gap: 14 (ref 5–15)
BUN: 38 mg/dL — ABNORMAL HIGH (ref 8–23)
CO2: 27 mmol/L (ref 22–32)
Calcium: 8.8 mg/dL — ABNORMAL LOW (ref 8.9–10.3)
Chloride: 95 mmol/L — ABNORMAL LOW (ref 98–111)
Creatinine, Ser: 1.91 mg/dL — ABNORMAL HIGH (ref 0.61–1.24)
GFR, Estimated: 35 mL/min — ABNORMAL LOW (ref >60)
Glucose, Bld: 105 mg/dL — ABNORMAL HIGH (ref 70–99)
Potassium: 3.4 mmol/L — ABNORMAL LOW (ref 3.5–5.1)
Sodium: 136 mmol/L (ref 135–145)

## 2023-03-26 LAB — GLUCOSE, CAPILLARY: Glucose-Capillary: 101 mg/dL — ABNORMAL HIGH (ref 70–99)

## 2023-03-26 MED ORDER — FUROSEMIDE 10 MG/ML IJ SOLN
40.0000 mg | Freq: Once | INTRAMUSCULAR | Status: AC
  Administered 2023-03-26: 40 mg via INTRAVENOUS
  Filled 2023-03-26: qty 4

## 2023-03-26 MED ORDER — POTASSIUM CHLORIDE CRYS ER 20 MEQ PO TBCR
40.0000 meq | EXTENDED_RELEASE_TABLET | Freq: Once | ORAL | Status: AC
  Administered 2023-03-26: 40 meq via ORAL
  Filled 2023-03-26: qty 2

## 2023-03-26 NOTE — Progress Notes (Signed)
Speech Language Pathology Treatment: Dysphagia  Patient Details Name: Tanner [name] MRN: [id number] DOB: November 26, 1945 Today's Date: 03/26/2023 Time: 4403-4742 SLP Time Calculation (min) (ACUTE ONLY): 24 min  Assessment / Plan / Recommendation Clinical Impression  Pt sitting upright in chair. Denture cream had been delivered, placed on upper and lower plate and donned. They were well adhered to maxillary and mandible and remained in place throughout session. Pt is performing chin tuck independently and able to recall throat clear strategy but needed mild cues to perform. He did cough several times using chin tuck with thin that he stated was not due to compensatory strategy and may have had airway invasion - cough strong and may have cleared vestibule if penetration occurred. He needed cues for second swallow to assist in clearing residue. He masticated regular solid, used a liquid wash to moisten and cleared oral cavity and recommend continuing Dys 3 texture (in case there are times when he doesn't donn his dentures). Needs cues to recall swallow twice and throat clear. Pt scheduled to go to SNF possibly today per chart notes.    HPI HPI: Varun "[name]" SHELDEN RABORN is a 77 yo male presenting to ED with AMS, productive cough, and body aches. Found to be hypoxic, which was improved with placement of 4L [place]. CXR with bibasilar airspace opacities, R>L, which may be reflective of atelectasis or PNA. PMH includes paroxysmal A-fib on Eliquis, essential HTN, HLD, HFpEF, CKD 3A      SLP Plan  Continue with current plan of care      Recommendations for follow up therapy are one component of a multi-disciplinary discharge planning process, led by the attending physician.  Recommendations may be updated based on patient status, additional functional criteria and insurance authorization.    Recommendations  Diet recommendations: Dysphagia 3 (mechanical soft);Thin liquid Liquids provided via:  Cup;Straw Medication Administration: Whole meds with puree Supervision: Intermittent supervision to cue for compensatory strategies Compensations: Slow rate;Small sips/bites;Multiple dry swallows after each bite/sip;Chin tuck;Other (Comment) (throat clear after liquids) Postural Changes and/or Swallow Maneuvers: Seated upright 90 degrees;Upright 30-60 min after meal                  Oral care BID   Intermittent Supervision/Assistance Dysphagia, oropharyngeal phase (R13.12)     Continue with current plan of care     Royce Macadamia  03/26/2023, 11:11 AM

## 2023-03-26 NOTE — Discharge Summary (Addendum)
Tanner Broker, MD. Schedule an appointment as soon as possible for a visit in 1 week(s).   Specialty: Internal Medicine Contact information: [address] [place] [address] [phone]                Discharge Exam: Ceasar Mons Weights   03/18/23 0025  Weight: 105.2 kg   General: NAD  Cardiovascular: S1, S2 present Respiratory: CTAB Abdomen: Soft, nontender, nondistended, bowel sounds present Musculoskeletal: No bilateral pedal edema noted Skin: Stage II ulcer over left buttock Psychiatry: Normal mood   Condition at discharge: stable  The results of significant diagnostics from this hospitalization (including imaging, microbiology, ancillary and laboratory) are listed below for reference.   Imaging Studies: DG CHEST PORT 1 VIEW  Result Date: 03/25/2023 CLINICAL DATA:  Leukocytosis EXAM: PORTABLE CHEST 1 VIEW COMPARISON:  CXR 03/18/23 FINDINGS: Pleural effusion. No  pneumothorax. Normal cardiac and mediastinal contours. Hazy bibasilar airspace opacities could represent atelectasis infection. Visualized upper abdomen is unremarkable. No radiographically apparent displaced rib fractures. IMPRESSION: Hazy bibasilar airspace opacities could represent atelectasis or infection. Electronically Signed   By: Lorenza Cambridge M.D.   On: 03/25/2023 12:35   DG Swallowing Func-Speech Pathology  Result Date: 03/19/2023 Table formatting from the original result was not included. Modified Barium Swallow Study Patient Details Name: NILES ESS MRN: [id number] Date of Birth: 1945-12-11 Today's Date: 03/19/2023 HPI/PMH: HPI: Pasha "Bill" CHARBEL LOS is a 77 yo male presenting to ED with AMS, productive cough, and body aches. Found to be hypoxic, which was improved with placement of 4L [place]. CXR with bibasilar airspace opacities, R>L, which may be reflective of atelectasis or PNA. 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 Pt's oral phase is characterized by R anterior spillage and moderate residue, which pt has difficulty clearing with the use of a liquid wash. He has reduced tongue base retraction contributing to significant vallecular residue across all trials. Direct visualization of the trachea is hard to achieve due to body habitus and positioning. Penetration of thin liquids without ejection noted (PAS 3), although suspect penetrates may have reached the level of the vocal folds before being sensed and attempts were made to expel. This appeared to improve with the use of a chin tuck posture, however, this continues to be difficult to assess without direct visualization. Pt has copious pharyngeal residue which increases the risk of airway invasion when the pt is at rest and the  laryngeal vestibule remains unprotected. During the swallow, pt achieves excellent laryngeal elevation and closure. Suspect pt's most significant risk for aspiration is after the swallow due to residue. Recommend continuing diet of Dys 3 texture solids due to oral deficits with thin liquids and use of a chin tuck and frequent throat clearing and coughing. He may require increased supervision to provide cueing and monitor for anterior loss and oral residue. Will continue to follow. Factors that may increase risk of adverse event in presence of aspiration Rubye Oaks & Clearance Coots 2021): Factors that may increase risk of adverse event in presence of aspiration Rubye Oaks & Clearance Coots 2021): Respiratory or GI disease; Limited mobility Recommendations/Plan: Swallowing Evaluation Recommendations Swallowing Evaluation Recommendations Recommendations: PO diet PO Diet Recommendation: Dysphagia 3 (Mechanical soft); Thin liquids (Level 0) Liquid Administration via: Cup; Straw Medication Administration: Crushed with puree Supervision: Staff to assist with self-feeding;  Assessment: Faces Faces Pain Scale: 0 Pain Location: buttocks Pain Descriptors / Indicators: Sore Pain Intervention(s): Monitored during session End of Session: Start Time:SLP Start Time (ACUTE ONLY): 1054 Stop Time: SLP Stop Time (ACUTE ONLY): 1118 Time Calculation:SLP Time Calculation (min) (ACUTE ONLY): 24 min Charges: SLP  Evaluations $ SLP Speech Visit: 1 Visit SLP Evaluations $BSS Swallow: 1 Procedure $MBS Swallow: 1 Procedure SLP visit diagnosis: SLP Visit Diagnosis: Dysphagia, oropharyngeal phase (R13.12) Past Medical History: Past Medical History: Diagnosis Date  Acute kidney failure (HCC)   Anxiety   Arthritis   Back pain   BPH (benign prostatic hypertrophy)   CHF (congestive heart failure) (HCC)   Clostridium difficile infection   Clotting disorder (HCC)   Depression   Diabetes (HCC) 12/11/2016  type 2   DVT (deep venous thrombosis) (HCC)   DVT of deep femoral vein, right (HCC) 06/29/2018  Edema, lower extremity   High cholesterol   Hypertension   Joint pain   Low back pain potentially associated with spinal stenosis   Neuromuscular disorder (HCC)   Neuropathy of lower extremity   bilateral  OSA (obstructive sleep apnea)   pt denies   Osteoarthritis   PE (pulmonary embolism)   Pneumonia   hx of x 2   Ventral hernia  Past Surgical History: Past Surgical History: Procedure Laterality Date  CARDIOVERSION N/A 08/19/2022  Procedure: CARDIOVERSION;  Surgeon: Meriam Sprague, MD;  Location: [facility] ENDOSCOPY;  Service: Cardiovascular;  Laterality: N/A;  EYE SURGERY    HERNIA REPAIR  1999  TOTAL KNEE ARTHROPLASTY Left 08/26/2020  Procedure: LEFT TOTAL KNEE ARTHROPLASTY;  Surgeon: Gean Birchwood, MD;  Location: [facility] ORS;  Service: Orthopedics;  Laterality: Left;  TOTAL KNEE ARTHROPLASTY Right 12/15/2022  Procedure: TOTAL KNEE ARTHROPLASTY;  Surgeon: Durene Romans, MD;  Location: [facility] ORS;  Service: Orthopedics;  Laterality: Right; Gwynneth Aliment, M.A., CF-SLP Speech Language Pathology, Acute Rehabilitation Services Secure Chat preferred [phone] 03/19/2023, 12:06 PM  VAS [name] LOWER EXTREMITY VENOUS (DVT)  Result Date: 03/18/2023  Lower Venous DVT Study Patient Name:  HJALMER IOVINO  Date of Exam:   03/18/2023 Medical Rec #: [id number]       Accession #:    [id number] Date of Birth: [birth year]       Patient Gender: M Patient Age:   77 years Exam  Location:  [facility] Procedure:      VAS [name] LOWER EXTREMITY VENOUS (DVT) Referring Phys: Dow Adolph --------------------------------------------------------------------------------  Indications: Edema. Status post fall on right knee, knee effusion by X-ray. Right TKR 12/15/22  Comparison Study: Prior negative Right LEV done 01/07/22 Performing Technologist: Sherren Kerns RVS  Examination Guidelines: A complete evaluation includes B-mode imaging, spectral Doppler, color Doppler, and power Doppler as needed of all accessible portions of each vessel. Bilateral testing is considered an integral part of a complete examination. Limited examinations for reoccurring indications may be performed as noted. The reflux portion of the exam is performed with the patient in reverse Trendelenburg.  +--------+---------------+---------+-----------+----------------+-------------+ RIGHT   CompressibilityPhasicitySpontaneityProperties      Thrombus                                                                 Aging         +--------+---------------+---------+-----------+----------------+-------------+ CFV  Tanner Broker, MD. Schedule an appointment as soon as possible for a visit in 1 week(s).   Specialty: Internal Medicine Contact information: [address] [place] [address] [phone]                Discharge Exam: Ceasar Mons Weights   03/18/23 0025  Weight: 105.2 kg   General: NAD  Cardiovascular: S1, S2 present Respiratory: CTAB Abdomen: Soft, nontender, nondistended, bowel sounds present Musculoskeletal: No bilateral pedal edema noted Skin: Stage II ulcer over left buttock Psychiatry: Normal mood   Condition at discharge: stable  The results of significant diagnostics from this hospitalization (including imaging, microbiology, ancillary and laboratory) are listed below for reference.   Imaging Studies: DG CHEST PORT 1 VIEW  Result Date: 03/25/2023 CLINICAL DATA:  Leukocytosis EXAM: PORTABLE CHEST 1 VIEW COMPARISON:  CXR 03/18/23 FINDINGS: Pleural effusion. No  pneumothorax. Normal cardiac and mediastinal contours. Hazy bibasilar airspace opacities could represent atelectasis infection. Visualized upper abdomen is unremarkable. No radiographically apparent displaced rib fractures. IMPRESSION: Hazy bibasilar airspace opacities could represent atelectasis or infection. Electronically Signed   By: Lorenza Cambridge M.D.   On: 03/25/2023 12:35   DG Swallowing Func-Speech Pathology  Result Date: 03/19/2023 Table formatting from the original result was not included. Modified Barium Swallow Study Patient Details Name: NILES ESS MRN: [id number] Date of Birth: 1945-12-11 Today's Date: 03/19/2023 HPI/PMH: HPI: Pasha "Bill" CHARBEL LOS is a 77 yo male presenting to ED with AMS, productive cough, and body aches. Found to be hypoxic, which was improved with placement of 4L [place]. CXR with bibasilar airspace opacities, R>L, which may be reflective of atelectasis or PNA. 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 Pt's oral phase is characterized by R anterior spillage and moderate residue, which pt has difficulty clearing with the use of a liquid wash. He has reduced tongue base retraction contributing to significant vallecular residue across all trials. Direct visualization of the trachea is hard to achieve due to body habitus and positioning. Penetration of thin liquids without ejection noted (PAS 3), although suspect penetrates may have reached the level of the vocal folds before being sensed and attempts were made to expel. This appeared to improve with the use of a chin tuck posture, however, this continues to be difficult to assess without direct visualization. Pt has copious pharyngeal residue which increases the risk of airway invasion when the pt is at rest and the  laryngeal vestibule remains unprotected. During the swallow, pt achieves excellent laryngeal elevation and closure. Suspect pt's most significant risk for aspiration is after the swallow due to residue. Recommend continuing diet of Dys 3 texture solids due to oral deficits with thin liquids and use of a chin tuck and frequent throat clearing and coughing. He may require increased supervision to provide cueing and monitor for anterior loss and oral residue. Will continue to follow. Factors that may increase risk of adverse event in presence of aspiration Rubye Oaks & Clearance Coots 2021): Factors that may increase risk of adverse event in presence of aspiration Rubye Oaks & Clearance Coots 2021): Respiratory or GI disease; Limited mobility Recommendations/Plan: Swallowing Evaluation Recommendations Swallowing Evaluation Recommendations Recommendations: PO diet PO Diet Recommendation: Dysphagia 3 (Mechanical soft); Thin liquids (Level 0) Liquid Administration via: Cup; Straw Medication Administration: Crushed with puree Supervision: Staff to assist with self-feeding;  Assessment: Faces Faces Pain Scale: 0 Pain Location: buttocks Pain Descriptors / Indicators: Sore Pain Intervention(s): Monitored during session End of Session: Start Time:SLP Start Time (ACUTE ONLY): 1054 Stop Time: SLP Stop Time (ACUTE ONLY): 1118 Time Calculation:SLP Time Calculation (min) (ACUTE ONLY): 24 min Charges: SLP  Evaluations $ SLP Speech Visit: 1 Visit SLP Evaluations $BSS Swallow: 1 Procedure $MBS Swallow: 1 Procedure SLP visit diagnosis: SLP Visit Diagnosis: Dysphagia, oropharyngeal phase (R13.12) Past Medical History: Past Medical History: Diagnosis Date  Acute kidney failure (HCC)   Anxiety   Arthritis   Back pain   BPH (benign prostatic hypertrophy)   CHF (congestive heart failure) (HCC)   Clostridium difficile infection   Clotting disorder (HCC)   Depression   Diabetes (HCC) 12/11/2016  type 2   DVT (deep venous thrombosis) (HCC)   DVT of deep femoral vein, right (HCC) 06/29/2018  Edema, lower extremity   High cholesterol   Hypertension   Joint pain   Low back pain potentially associated with spinal stenosis   Neuromuscular disorder (HCC)   Neuropathy of lower extremity   bilateral  OSA (obstructive sleep apnea)   pt denies   Osteoarthritis   PE (pulmonary embolism)   Pneumonia   hx of x 2   Ventral hernia  Past Surgical History: Past Surgical History: Procedure Laterality Date  CARDIOVERSION N/A 08/19/2022  Procedure: CARDIOVERSION;  Surgeon: Meriam Sprague, MD;  Location: [facility] ENDOSCOPY;  Service: Cardiovascular;  Laterality: N/A;  EYE SURGERY    HERNIA REPAIR  1999  TOTAL KNEE ARTHROPLASTY Left 08/26/2020  Procedure: LEFT TOTAL KNEE ARTHROPLASTY;  Surgeon: Gean Birchwood, MD;  Location: [facility];  Service: Orthopedics;  Laterality: Left;  TOTAL KNEE ARTHROPLASTY Right 12/15/2022  Procedure: TOTAL KNEE ARTHROPLASTY;  Surgeon: Durene Romans, MD;  Location: [facility];  Service: Orthopedics;  Laterality: Right; Gwynneth Aliment, M.A., CF-SLP Speech Language Pathology, Acute Rehabilitation Services Secure Chat preferred [phone] 03/19/2023, 12:06 PM  VAS [name] LOWER EXTREMITY VENOUS (DVT)  Result Date: 03/18/2023  Lower Venous DVT Study Patient Name:  HJALMER IOVINO  Date of Exam:   03/18/2023 Medical Rec #: [id number]       Accession #:    [id number] Date of Birth: [birth year]       Patient Gender: M Patient Age:   77 years Exam  Location:  [facility] Procedure:      VAS [name] LOWER EXTREMITY VENOUS (DVT) Referring Phys: Dow Adolph --------------------------------------------------------------------------------  Indications: Edema. Status post fall on right knee, knee effusion by X-ray. Right TKR 12/15/22  Comparison Study: Prior negative Right LEV done 01/07/22 Performing Technologist: Sherren Kerns RVS  Examination Guidelines: A complete evaluation includes B-mode imaging, spectral Doppler, color Doppler, and power Doppler as needed of all accessible portions of each vessel. Bilateral testing is considered an integral part of a complete examination. Limited examinations for reoccurring indications may be performed as noted. The reflux portion of the exam is performed with the patient in reverse Trendelenburg.  +--------+---------------+---------+-----------+----------------+-------------+ RIGHT   CompressibilityPhasicitySpontaneityProperties      Thrombus                                                                 Aging         +--------+---------------+---------+-----------+----------------+-------------+ CFV  Assessment: Faces Faces Pain Scale: 0 Pain Location: buttocks Pain Descriptors / Indicators: Sore Pain Intervention(s): Monitored during session End of Session: Start Time:SLP Start Time (ACUTE ONLY): 1054 Stop Time: SLP Stop Time (ACUTE ONLY): 1118 Time Calculation:SLP Time Calculation (min) (ACUTE ONLY): 24 min Charges: SLP  Evaluations $ SLP Speech Visit: 1 Visit SLP Evaluations $BSS Swallow: 1 Procedure $MBS Swallow: 1 Procedure SLP visit diagnosis: SLP Visit Diagnosis: Dysphagia, oropharyngeal phase (R13.12) Past Medical History: Past Medical History: Diagnosis Date  Acute kidney failure (HCC)   Anxiety   Arthritis   Back pain   BPH (benign prostatic hypertrophy)   CHF (congestive heart failure) (HCC)   Clostridium difficile infection   Clotting disorder (HCC)   Depression   Diabetes (HCC) 12/11/2016  type 2   DVT (deep venous thrombosis) (HCC)   DVT of deep femoral vein, right (HCC) 06/29/2018  Edema, lower extremity   High cholesterol   Hypertension   Joint pain   Low back pain potentially associated with spinal stenosis   Neuromuscular disorder (HCC)   Neuropathy of lower extremity   bilateral  OSA (obstructive sleep apnea)   pt denies   Osteoarthritis   PE (pulmonary embolism)   Pneumonia   hx of x 2   Ventral hernia  Past Surgical History: Past Surgical History: Procedure Laterality Date  CARDIOVERSION N/A 08/19/2022  Procedure: CARDIOVERSION;  Surgeon: Meriam Sprague, MD;  Location: [facility] ENDOSCOPY;  Service: Cardiovascular;  Laterality: N/A;  EYE SURGERY    HERNIA REPAIR  1999  TOTAL KNEE ARTHROPLASTY Left 08/26/2020  Procedure: LEFT TOTAL KNEE ARTHROPLASTY;  Surgeon: Gean Birchwood, MD;  Location: [facility];  Service: Orthopedics;  Laterality: Left;  TOTAL KNEE ARTHROPLASTY Right 12/15/2022  Procedure: TOTAL KNEE ARTHROPLASTY;  Surgeon: Durene Romans, MD;  Location: [facility];  Service: Orthopedics;  Laterality: Right; Gwynneth Aliment, M.A., CF-SLP Speech Language Pathology, Acute Rehabilitation Services Secure Chat preferred [phone] 03/19/2023, 12:06 PM  VAS [name] LOWER EXTREMITY VENOUS (DVT)  Result Date: 03/18/2023  Lower Venous DVT Study Patient Name:  HJALMER IOVINO  Date of Exam:   03/18/2023 Medical Rec #: [id number]       Accession #:    [id number] Date of Birth: [birth year]       Patient Gender: M Patient Age:   77 years Exam  Location:  [facility] Procedure:      VAS [name] LOWER EXTREMITY VENOUS (DVT) Referring Phys: Dow Adolph --------------------------------------------------------------------------------  Indications: Edema. Status post fall on right knee, knee effusion by X-ray. Right TKR 12/15/22  Comparison Study: Prior negative Right LEV done 01/07/22 Performing Technologist: Sherren Kerns RVS  Examination Guidelines: A complete evaluation includes B-mode imaging, spectral Doppler, color Doppler, and power Doppler as needed of all accessible portions of each vessel. Bilateral testing is considered an integral part of a complete examination. Limited examinations for reoccurring indications may be performed as noted. The reflux portion of the exam is performed with the patient in reverse Trendelenburg.  +--------+---------------+---------+-----------+----------------+-------------+ RIGHT   CompressibilityPhasicitySpontaneityProperties      Thrombus                                                                 Aging         +--------+---------------+---------+-----------+----------------+-------------+ CFV  Tanner Broker, MD. Schedule an appointment as soon as possible for a visit in 1 week(s).   Specialty: Internal Medicine Contact information: [address] [place] [address] [phone]                Discharge Exam: Ceasar Mons Weights   03/18/23 0025  Weight: 105.2 kg   General: NAD  Cardiovascular: S1, S2 present Respiratory: CTAB Abdomen: Soft, nontender, nondistended, bowel sounds present Musculoskeletal: No bilateral pedal edema noted Skin: Stage II ulcer over left buttock Psychiatry: Normal mood   Condition at discharge: stable  The results of significant diagnostics from this hospitalization (including imaging, microbiology, ancillary and laboratory) are listed below for reference.   Imaging Studies: DG CHEST PORT 1 VIEW  Result Date: 03/25/2023 CLINICAL DATA:  Leukocytosis EXAM: PORTABLE CHEST 1 VIEW COMPARISON:  CXR 03/18/23 FINDINGS: Pleural effusion. No  pneumothorax. Normal cardiac and mediastinal contours. Hazy bibasilar airspace opacities could represent atelectasis infection. Visualized upper abdomen is unremarkable. No radiographically apparent displaced rib fractures. IMPRESSION: Hazy bibasilar airspace opacities could represent atelectasis or infection. Electronically Signed   By: Lorenza Cambridge M.D.   On: 03/25/2023 12:35   DG Swallowing Func-Speech Pathology  Result Date: 03/19/2023 Table formatting from the original result was not included. Modified Barium Swallow Study Patient Details Name: NILES ESS MRN: [id number] Date of Birth: 1945-12-11 Today's Date: 03/19/2023 HPI/PMH: HPI: Pasha "Bill" CHARBEL LOS is a 77 yo male presenting to ED with AMS, productive cough, and body aches. Found to be hypoxic, which was improved with placement of 4L [place]. CXR with bibasilar airspace opacities, R>L, which may be reflective of atelectasis or PNA. 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 Pt's oral phase is characterized by R anterior spillage and moderate residue, which pt has difficulty clearing with the use of a liquid wash. He has reduced tongue base retraction contributing to significant vallecular residue across all trials. Direct visualization of the trachea is hard to achieve due to body habitus and positioning. Penetration of thin liquids without ejection noted (PAS 3), although suspect penetrates may have reached the level of the vocal folds before being sensed and attempts were made to expel. This appeared to improve with the use of a chin tuck posture, however, this continues to be difficult to assess without direct visualization. Pt has copious pharyngeal residue which increases the risk of airway invasion when the pt is at rest and the  laryngeal vestibule remains unprotected. During the swallow, pt achieves excellent laryngeal elevation and closure. Suspect pt's most significant risk for aspiration is after the swallow due to residue. Recommend continuing diet of Dys 3 texture solids due to oral deficits with thin liquids and use of a chin tuck and frequent throat clearing and coughing. He may require increased supervision to provide cueing and monitor for anterior loss and oral residue. Will continue to follow. Factors that may increase risk of adverse event in presence of aspiration Rubye Oaks & Clearance Coots 2021): Factors that may increase risk of adverse event in presence of aspiration Rubye Oaks & Clearance Coots 2021): Respiratory or GI disease; Limited mobility Recommendations/Plan: Swallowing Evaluation Recommendations Swallowing Evaluation Recommendations Recommendations: PO diet PO Diet Recommendation: Dysphagia 3 (Mechanical soft); Thin liquids (Level 0) Liquid Administration via: Cup; Straw Medication Administration: Crushed with puree Supervision: Staff to assist with self-feeding;  Physician Discharge Summary   Patient: Tanner [name] MRN: [id number] DOB: [birth year]  Admit date:     03/18/2023  Discharge date: 03/26/23  Discharge Physician: Briant Cedar   PCP: Tanner Broker, MD   Recommendations at discharge:   Outpatient follow-up with orthopedic surgery as previously planned Check CMP and CBC in 1 week  Discharge Diagnoses: Principal Problem:   Aspiration pneumonia (HCC) Active Problems:   Essential hypertension   Chronic kidney disease (CKD), stage III (moderate) (HCC)   History of pulmonary embolus (PE)   OSA (obstructive sleep apnea)   Morbid obesity (HCC)   Chronic systolic heart failure (HCC)   Paroxysmal atrial fibrillation (HCC)   S/P total knee arthroplasty, right   Pressure injury of skin   Severe sepsis [facility])    Hospital Course: 77 year old M with PMH of PE/PAF on Eliquis, HTN, HLD, HFmrEF, CKD-3A, OSA and recent right TKA by Dr. Charlann Boxer presented to ED with altered mental status, productive cough, myalgia and hypoxemia to 80s per EMS, and admitted for acute respiratory failure with hypoxia due to pneumonia.  CXR showed bibasilar opacities, right > left.  He also had significant right knee effusion with RLE swelling and erythema.  He was hemodynamically stable.  Lower extremity venous Doppler ordered.  Started on ceftriaxone and Zithromax, switched to Augmentin/zithromax and completed 8 days. Patient met criteria for severe sepsis due to aspiration pneumonia.  Blood cultures NGTD.  Encephalopathy, respiratory symptoms and right lower extremity cellulitis/swelling improved. Therapy recommended SNF. In regards to right TKA, outpatient follow-up with orthopedic surgery.     Assessment and Plan:  Severe sepsis due to bibasilar aspiration pneumonia and RLE cellulitis: POA Had leukocytosis, tachypnea, respiratory failure and mental status change on presentation Concern for aspiration pneumonia versus CAP given mental status change and  risk for aspiration Blood cultures NGTD Still with fluctuating leukocytosis, but no symptoms of infection  Repeat CXR with possible atelectasis Vs infiltrate, but completely stable with no new complaints S/p IV ceftriaoxne/azithromycin, completed total of 8 days course of AB SLP recommended dysphagia 3 diet Aspiration precaution Pulmonary toilet   Acute hypoxic respiratory failure secondary to the above Remains on room air at rest, but may require minimal O2 upon ambulation Intensity spirometry Further management as above   Generalized weakness with fall Reportedly fell 1 week ago No apparent trauma but significant right knee effusion SNF   RLE edema/cellulitis post fall Right knee x-ray showed large effusion.  LE venous Doppler negative for DVT.   History of right TKA in 12/2022 by Dr. Charlann Boxer Large right knee effusion Orthopedic surgery consulted and did not feel he needs aspiration.  Recommended outpatient follow-up with Dr. Charlann Boxer Antibiotics as above Fall precaution PT/OT   Acute metabolic encephalopathy Resolved Multifactorial including severe sepsis, respiratory failure and polypharmacy No focal neurodeficit on exam.  UDS, ammonia, B12, VBG, TSH and CT head unrevealing.   Reorientation and delirium precaution Discontinued sedating medication   Chronic HFmrEF TTE in 08/2022 with LVEF of 45 to 50%, GH, indeterminate DD, and normal RV SF BNP 157 Restart home torsemide and monitor creatinine very closely Repeat BMP in a week   DM-2 with hyperglycemia and hypoglycemia A1c 5.3, On Jardiance.  No longer on Ozempic   Paroxysmal A-fib Rate controlled Continue home Coreg, amiodarone and Eliquis Optimize electrolytes   OSA not on CPAP   CKD-3B Stable, with mild bump in creatinine Repeat BMP in a week   Hypertension Continue home Coreg   Obesity: Elevated  Assessment: Faces Faces Pain Scale: 0 Pain Location: buttocks Pain Descriptors / Indicators: Sore Pain Intervention(s): Monitored during session End of Session: Start Time:SLP Start Time (ACUTE ONLY): 1054 Stop Time: SLP Stop Time (ACUTE ONLY): 1118 Time Calculation:SLP Time Calculation (min) (ACUTE ONLY): 24 min Charges: SLP  Evaluations $ SLP Speech Visit: 1 Visit SLP Evaluations $BSS Swallow: 1 Procedure $MBS Swallow: 1 Procedure SLP visit diagnosis: SLP Visit Diagnosis: Dysphagia, oropharyngeal phase (R13.12) Past Medical History: Past Medical History: Diagnosis Date  Acute kidney failure (HCC)   Anxiety   Arthritis   Back pain   BPH (benign prostatic hypertrophy)   CHF (congestive heart failure) (HCC)   Clostridium difficile infection   Clotting disorder (HCC)   Depression   Diabetes (HCC) 12/11/2016  type 2   DVT (deep venous thrombosis) (HCC)   DVT of deep femoral vein, right (HCC) 06/29/2018  Edema, lower extremity   High cholesterol   Hypertension   Joint pain   Low back pain potentially associated with spinal stenosis   Neuromuscular disorder (HCC)   Neuropathy of lower extremity   bilateral  OSA (obstructive sleep apnea)   pt denies   Osteoarthritis   PE (pulmonary embolism)   Pneumonia   hx of x 2   Ventral hernia  Past Surgical History: Past Surgical History: Procedure Laterality Date  CARDIOVERSION N/A 08/19/2022  Procedure: CARDIOVERSION;  Surgeon: Meriam Sprague, MD;  [facility] ENDOSCOPY;  Service: Cardiovascular;  Laterality: N/A;  EYE SURGERY    HERNIA REPAIR  1999  TOTAL KNEE ARTHROPLASTY Left 08/26/2020  Procedure: LEFT TOTAL KNEE ARTHROPLASTY;  Surgeon: Gean Birchwood, MD;  Location: [facility] ORS;  Service: Orthopedics;  Laterality: Left;  TOTAL KNEE ARTHROPLASTY Right 12/15/2022  Procedure: TOTAL KNEE ARTHROPLASTY;  Surgeon: Durene Romans, MD;  Location: [facility] ORS;  Service: Orthopedics;  Laterality: Right; Gwynneth Aliment, M.A., CF-SLP Speech Language Pathology, Acute Rehabilitation Services Secure Chat preferred [phone] 03/19/2023, 12:06 PM  VAS [name] LOWER EXTREMITY VENOUS (DVT)  Result Date: 03/18/2023  Lower Venous DVT Study Patient Name:  HJALMER IOVINO  Date of Exam:   03/18/2023 Medical Rec #: [id number]       Accession #:    [id number] Date of Birth: [birth year]       Patient Gender: M Patient Age:   77 years Exam  Location:  [facility] Procedure:      VAS [name] LOWER EXTREMITY VENOUS (DVT) Referring Phys: Dow Adolph --------------------------------------------------------------------------------  Indications: Edema. Status post fall on right knee, knee effusion by X-ray. Right TKR 12/15/22  Comparison Study: Prior negative Right LEV done 01/07/22 Performing Technologist: Sherren Kerns RVS  Examination Guidelines: A complete evaluation includes B-mode imaging, spectral Doppler, color Doppler, and power Doppler as needed of all accessible portions of each vessel. Bilateral testing is considered an integral part of a complete examination. Limited examinations for reoccurring indications may be performed as noted. The reflux portion of the exam is performed with the patient in reverse Trendelenburg.  +--------+---------------+---------+-----------+----------------+-------------+ RIGHT   CompressibilityPhasicitySpontaneityProperties      Thrombus                                                                 Aging         +--------+---------------+---------+-----------+----------------+-------------+ CFV  Tanner Broker, MD. Schedule an appointment as soon as possible for a visit in 1 week(s).   Specialty: Internal Medicine Contact information: [address] [place] [address] [phone]                Discharge Exam: Ceasar Mons Weights   03/18/23 0025  Weight: 105.2 kg   General: NAD  Cardiovascular: S1, S2 present Respiratory: CTAB Abdomen: Soft, nontender, nondistended, bowel sounds present Musculoskeletal: No bilateral pedal edema noted Skin: Stage II ulcer over left buttock Psychiatry: Normal mood   Condition at discharge: stable  The results of significant diagnostics from this hospitalization (including imaging, microbiology, ancillary and laboratory) are listed below for reference.   Imaging Studies: DG CHEST PORT 1 VIEW  Result Date: 03/25/2023 CLINICAL DATA:  Leukocytosis EXAM: PORTABLE CHEST 1 VIEW COMPARISON:  CXR 03/18/23 FINDINGS: Pleural effusion. No  pneumothorax. Normal cardiac and mediastinal contours. Hazy bibasilar airspace opacities could represent atelectasis infection. Visualized upper abdomen is unremarkable. No radiographically apparent displaced rib fractures. IMPRESSION: Hazy bibasilar airspace opacities could represent atelectasis or infection. Electronically Signed   By: Lorenza Cambridge M.D.   On: 03/25/2023 12:35   DG Swallowing Func-Speech Pathology  Result Date: 03/19/2023 Table formatting from the original result was not included. Modified Barium Swallow Study Patient Details Name: NILES ESS MRN: [id number] Date of Birth: 1945-12-11 Today's Date: 03/19/2023 HPI/PMH: HPI: Pasha "Bill" CHARBEL LOS is a 77 yo male presenting to ED with AMS, productive cough, and body aches. Found to be hypoxic, which was improved with placement of 4L [place]. CXR with bibasilar airspace opacities, R>L, which may be reflective of atelectasis or PNA. 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 Pt's oral phase is characterized by R anterior spillage and moderate residue, which pt has difficulty clearing with the use of a liquid wash. He has reduced tongue base retraction contributing to significant vallecular residue across all trials. Direct visualization of the trachea is hard to achieve due to body habitus and positioning. Penetration of thin liquids without ejection noted (PAS 3), although suspect penetrates may have reached the level of the vocal folds before being sensed and attempts were made to expel. This appeared to improve with the use of a chin tuck posture, however, this continues to be difficult to assess without direct visualization. Pt has copious pharyngeal residue which increases the risk of airway invasion when the pt is at rest and the  laryngeal vestibule remains unprotected. During the swallow, pt achieves excellent laryngeal elevation and closure. Suspect pt's most significant risk for aspiration is after the swallow due to residue. Recommend continuing diet of Dys 3 texture solids due to oral deficits with thin liquids and use of a chin tuck and frequent throat clearing and coughing. He may require increased supervision to provide cueing and monitor for anterior loss and oral residue. Will continue to follow. Factors that may increase risk of adverse event in presence of aspiration Rubye Oaks & Clearance Coots 2021): Factors that may increase risk of adverse event in presence of aspiration Rubye Oaks & Clearance Coots 2021): Respiratory or GI disease; Limited mobility Recommendations/Plan: Swallowing Evaluation Recommendations Swallowing Evaluation Recommendations Recommendations: PO diet PO Diet Recommendation: Dysphagia 3 (Mechanical soft); Thin liquids (Level 0) Liquid Administration via: Cup; Straw Medication Administration: Crushed with puree Supervision: Staff to assist with self-feeding;

## 2023-03-26 NOTE — Plan of Care (Signed)
  Problem: Education: Goal: Individualized Educational Video(s) Outcome: Completed/Met   Problem: Activity: Goal: Ability to avoid complications of mobility impairment will improve Outcome: Completed/Met Goal: Range of joint motion will improve Outcome: Completed/Met   Problem: Clinical Measurements: Goal: Postoperative complications will be avoided or minimized Outcome: Completed/Met   Problem: Pain Management: Goal: Pain level will decrease with appropriate interventions Outcome: Completed/Met   Problem: Skin Integrity: Goal: Will show signs of wound healing Outcome: Completed/Met   Problem: Education: Goal: Ability to describe self-care measures that may prevent or decrease complications (Diabetes Survival Skills Education) will improve Outcome: Completed/Met Goal: Individualized Educational Video(s) Outcome: Completed/Met   Problem: Coping: Goal: Ability to adjust to condition or change in health will improve Outcome: Completed/Met   Problem: Fluid Volume: Goal: Ability to maintain a balanced intake and output will improve Outcome: Completed/Met   Problem: Health Behavior/Discharge Planning: Goal: Ability to identify and utilize available resources and services will improve Outcome: Completed/Met Goal: Ability to manage health-related needs will improve Outcome: Completed/Met   Problem: Metabolic: Goal: Ability to maintain appropriate glucose levels will improve Outcome: Completed/Met   Problem: Nutritional: Goal: Maintenance of adequate nutrition will improve Outcome: Completed/Met Goal: Progress toward achieving an optimal weight will improve Outcome: Completed/Met   Problem: Skin Integrity: Goal: Risk for impaired skin integrity will decrease Outcome: Completed/Met   Problem: Tissue Perfusion: Goal: Adequacy of tissue perfusion will improve Outcome: Completed/Met   Problem: Education: Goal: Knowledge of General Education information will  improve Description: Including pain rating scale, medication(s)/side effects and non-pharmacologic comfort measures Outcome: Completed/Met   Problem: Health Behavior/Discharge Planning: Goal: Ability to manage health-related needs will improve Outcome: Completed/Met   Problem: Clinical Measurements: Goal: Ability to maintain clinical measurements within normal limits will improve Outcome: Completed/Met Goal: Will remain free from infection Outcome: Completed/Met Goal: Diagnostic test results will improve Outcome: Completed/Met Goal: Respiratory complications will improve Outcome: Completed/Met Goal: Cardiovascular complication will be avoided Outcome: Completed/Met   Problem: Activity: Goal: Risk for activity intolerance will decrease Outcome: Completed/Met   Problem: Nutrition: Goal: Adequate nutrition will be maintained Outcome: Completed/Met   Problem: Coping: Goal: Level of anxiety will decrease Outcome: Completed/Met   Problem: Elimination: Goal: Will not experience complications related to bowel motility Outcome: Completed/Met Goal: Will not experience complications related to urinary retention Outcome: Completed/Met   Problem: Pain Managment: Goal: General experience of comfort will improve Outcome: Completed/Met   Problem: Safety: Goal: Ability to remain free from injury will improve Outcome: Completed/Met   Problem: Skin Integrity: Goal: Risk for impaired skin integrity will decrease Outcome: Completed/Met

## 2023-03-26 NOTE — TOC Transition Note (Signed)
Transition of Care [facility]) - CM/SW Discharge Note   Patient Details  Name: Tanner [name] MRN: [id number] Date of Birth: [birth year]  Transition of Care [facility] CM/SW Contact:  Erin Sons, LCSW Phone Number: 03/26/2023, 12:23 PM   Clinical Narrative:      Per MD patient ready for DC to [facility]. RN, patient, patient's family, and facility notified of DC. Discharge Summary and FL2 sent to facility. RN to call report prior to discharge [phone] ). DC packet on chart. Ambulance transport requested for patient.   CSW will sign off for now as social work intervention is no longer needed. Please consult [name] again if new needs arise.   Final next level of care: Skilled Nursing Facility Barriers to Discharge: No Barriers Identified   Patient Goals and CMS Choice CMS Medicare.gov Compare Post Acute Care list provided to:: Patient Represenative (must comment) Choice offered to / list presented to : Adult Children  Discharge Placement                Patient chooses bed at: [facility] [place] Patient to be transferred to facility by: [facility] Name of family member notified: Son Luisa Hart Patient and family notified of of transfer: 03/26/23  Discharge Plan and Services Additional resources added to the After Visit Summary for   In-house Referral: Clinical Social Work   Post Acute Care Choice: Skilled Nursing Facility                               Social Determinants of Health (SDOH) Interventions SDOH Screenings   Food Insecurity: No Food Insecurity (03/25/2023)  Housing: Low Risk  (03/25/2023)  Transportation Needs: No Transportation Needs (03/25/2023)  Utilities: Not At Risk (03/25/2023)  Alcohol Screen: Low Risk  (10/27/2022)  Depression (PHQ2-9): Low Risk  (01/04/2023)  Financial Resource Strain: Low Risk  (10/27/2022)  Physical Activity: Inactive (10/27/2022)  Social Connections: Moderately Integrated (10/27/2022)  Stress: No Stress Concern Present (12/31/2022)   Recent Concern: Stress - Stress Concern Present (10/30/2022)  Tobacco Use: Medium Risk (03/18/2023)     Readmission Risk Interventions    08/20/2022   11:10 AM  Readmission Risk Prevention Plan  Transportation Screening Complete  PCP or Specialist Appt within 3-5 Days Complete  HRI or Home Care Consult Complete  Palliative Care Screening Not Applicable  Medication Review (RN Care Manager) Complete

## 2023-03-26 NOTE — Progress Notes (Addendum)
Discharge order received. Patient is going to [facility] via [facility]. IV removed. AVS printed and placed in discharge packet. Report called to Mia.

## 2023-03-31 ENCOUNTER — Telehealth: Payer: Self-pay | Admitting: *Deleted

## 2023-03-31 DIAGNOSIS — L988 Other specified disorders of the skin and subcutaneous tissue: Secondary | ICD-10-CM | POA: Diagnosis not present

## 2023-03-31 DIAGNOSIS — L89322 Pressure ulcer of left buttock, stage 2: Secondary | ICD-10-CM | POA: Diagnosis not present

## 2023-03-31 NOTE — Progress Notes (Signed)
Care Coordination Note  03/31/2023 Name: Tanner [name] MRN: [id number] DOB: September 09, 1945  Tanner [name] is a 77 y.o. year old male who is a primary care patient of Myrlene Broker, MD and is actively engaged with the care management team. I reached out to [name] by phone today to assist with re-scheduling a follow up visit with the RN Case Manager  Follow up plan: Unsuccessful telephone outreach attempt made. A HIPAA compliant phone message was left for the patient providing contact information and requesting a return call.   Burman Nieves, CCMA Care Coordination Care Guide Direct Dial: [phone]

## 2023-04-01 ENCOUNTER — Encounter: Payer: Self-pay | Admitting: *Deleted

## 2023-04-01 NOTE — Congregational Nurse Program (Signed)
  Dept: [phone]   Congregational Nurse Program Note  Date of Encounter: 04/01/2023  Past Medical History: Past Medical History:  Diagnosis Date   Acute kidney failure (HCC)    Anxiety    Arthritis    Back pain    BPH (benign prostatic hypertrophy)    CHF (congestive heart failure) (HCC)    Clostridium difficile infection    Clotting disorder (HCC)    Depression    Diabetes (HCC) 12/11/2016   type 2    DVT (deep venous thrombosis) (HCC)    DVT of deep femoral vein, right (HCC) 06/29/2018   Edema, lower extremity    High cholesterol    Hypertension    Joint pain    Low back pain potentially associated with spinal stenosis    Neuromuscular disorder (HCC)    Neuropathy of lower extremity    bilateral   OSA (obstructive sleep apnea)    pt denies    Osteoarthritis    PE (pulmonary embolism)    Pneumonia    hx of x 2    Ventral hernia     Encounter Details:  patient got back home from [facility] on [phone].  Several church members helped him.  Spoke with bill and he said that he is at home and hit his knee on a door.  His children are coming in for the weekend but may need assistance next week  getting around in the house.  Text message to this effect sent to church leaders in order to see if some of the older members would be able to assist him.

## 2023-04-02 ENCOUNTER — Emergency Department (HOSPITAL_COMMUNITY)
Admission: EM | Admit: 2023-04-02 | Discharge: 2023-04-02 | Disposition: A | Discharge status: Home / Self Care | Payer: Medicare HMO | Attending: Emergency Medicine | Admitting: Emergency Medicine

## 2023-04-02 ENCOUNTER — Other Ambulatory Visit: Payer: Self-pay

## 2023-04-02 ENCOUNTER — Encounter (HOSPITAL_COMMUNITY): Payer: Self-pay | Admitting: *Deleted

## 2023-04-02 DIAGNOSIS — R58 Hemorrhage, not elsewhere classified: Secondary | ICD-10-CM | POA: Diagnosis not present

## 2023-04-02 DIAGNOSIS — R531 Weakness: Secondary | ICD-10-CM | POA: Diagnosis not present

## 2023-04-02 DIAGNOSIS — Z79899 Other long term (current) drug therapy: Secondary | ICD-10-CM | POA: Diagnosis not present

## 2023-04-02 DIAGNOSIS — Z7401 Bed confinement status: Secondary | ICD-10-CM | POA: Diagnosis not present

## 2023-04-02 DIAGNOSIS — I10 Essential (primary) hypertension: Secondary | ICD-10-CM | POA: Diagnosis not present

## 2023-04-02 DIAGNOSIS — W1840XA Slipping, tripping and stumbling without falling, unspecified, initial encounter: Secondary | ICD-10-CM | POA: Diagnosis not present

## 2023-04-02 DIAGNOSIS — S81812A Laceration without foreign body, left lower leg, initial encounter: Secondary | ICD-10-CM | POA: Insufficient documentation

## 2023-04-02 DIAGNOSIS — Z7901 Long term (current) use of anticoagulants: Secondary | ICD-10-CM | POA: Insufficient documentation

## 2023-04-02 DIAGNOSIS — I509 Heart failure, unspecified: Secondary | ICD-10-CM | POA: Insufficient documentation

## 2023-04-02 DIAGNOSIS — Z743 Need for continuous supervision: Secondary | ICD-10-CM | POA: Diagnosis not present

## 2023-04-02 DIAGNOSIS — I110 Hypertensive heart disease with heart failure: Secondary | ICD-10-CM | POA: Diagnosis not present

## 2023-04-02 DIAGNOSIS — S8992XA Unspecified injury of left lower leg, initial encounter: Secondary | ICD-10-CM | POA: Diagnosis not present

## 2023-04-02 MED ORDER — LIDOCAINE HCL (PF) 1 % IJ SOLN
INTRAMUSCULAR | Status: AC
  Filled 2023-04-02: qty 30

## 2023-04-02 NOTE — ED Triage Notes (Signed)
Pt says that he slipped down the steps and having bleeding on his leg, did not hit his head. Dressing to leg was changed just PTA., noted to be bleeding through.

## 2023-04-02 NOTE — ED Triage Notes (Signed)
Pt arrives via [facility], pt is coming from home, slipped down stairs. Skin tear to the right leg. Wrapped it up, when he woke up, he noticed bandage was saturated. On eliquis. 162/70, hr 70, 96%, cbg 128

## 2023-04-02 NOTE — ED Notes (Signed)
[facility] called at this time. 

## 2023-04-02 NOTE — ED Provider Notes (Signed)
[place] EMERGENCY DEPARTMENT AT [facility] Provider Note   CSN: [id number] Arrival date & time: 04/02/23  4098     History  Complaint: Laceration  Tanner [name] is a 77 y.o. male.  HPI   Patient has a history of hypertension depression low back pain with spinal stenosis, neuropathy, DVT, CHF on anticoagulation.  Patient states he tripped and stumbled last evening hitting his shin on steps.  Patient states a dressing was applied and it stopped bleeding but this morning it started bleeding again.  He resides in a nursing facility and was sent to the ED for evaluation.  Home Medications Prior to Admission medications   Medication Sig Start Date End Date Taking? Authorizing Provider  acetaminophen (TYLENOL) 500 MG tablet Take 1,000-1,500 mg by mouth as needed for moderate pain. 02/21/13   Norins, Rosalyn Gess, MD  amiodarone (PACERONE) 200 MG tablet Take 1 tablet (200 mg total) by mouth daily. 10/21/22   Maisie Fus, MD  apixaban (ELIQUIS) 5 MG TABS tablet Take 5 mg by mouth 2 (two) times daily.    [provider]  atorvastatin (LIPITOR) 80 MG tablet Take 1 tablet (80 mg total) by mouth daily. Patient taking differently: Take 80 mg by mouth every evening. 09/17/22 03/18/23  Myrlene Broker, MD  buPROPion (WELLBUTRIN SR) 150 MG 12 hr tablet Take 150 mg by mouth 2 (two) times daily.    [provider]  carvedilol (COREG) 6.25 MG tablet Take 1 tablet (6.25 mg total) by mouth 2 (two) times daily with a meal. 09/17/22 03/18/23  Myrlene Broker, MD  empagliflozin (JARDIANCE) 25 MG TABS tablet Take 12.5 mg by mouth daily.    [provider]  finasteride (PROSCAR) 5 MG tablet Take 5 mg by mouth at bedtime.    [provider]  Multiple Vitamins-Minerals (MULTIVITAMINS THER. W/MINERALS) TABS Take 1 tablet by mouth daily.    [provider]  polyethylene glycol powder (MIRALAX) 17 GM/SCOOP powder Take 17 g by mouth 2 (two) times daily as  needed for moderate constipation. 03/23/23   Almon Hercules, MD  senna-docusate (SENOKOT-S) 8.6-50 MG tablet Take 1 tablet by mouth 2 (two) times daily between meals as needed for mild constipation. 03/23/23   Almon Hercules, MD  torsemide (DEMADEX) 20 MG tablet TAKE 1 TABLET BY MOUTH EVERY DAY 01/18/23   Myrlene Broker, MD      Allergies    Lisinopril, Amlodipine, and Codeine sulfate    Review of Systems   Review of Systems  Physical Exam Updated Vital Signs BP (!) 145/61 (BP Location: Right Arm)   Pulse 65   Temp 98.2 F (36.8 C) (Oral)   Resp 18   Ht 1.651 m (5\' 5" )   Wt 108.9 kg   SpO2 96%   BMI 39.94 kg/m  Physical Exam Vitals and nursing note reviewed.  Constitutional:      General: He is not in acute distress.    Appearance: He is well-developed. He is not diaphoretic.  HENT:     Head: Normocephalic and atraumatic.     Right Ear: External ear normal.     Left Ear: External ear normal.  Eyes:     General: No scleral icterus.       Right eye: No discharge.        Left eye: No discharge.     Conjunctiva/sclera: Conjunctivae normal.  Neck:     Trachea: No tracheal deviation.  Cardiovascular:  Rate and Rhythm: Normal rate.  Pulmonary:     Effort: Pulmonary effort is normal. No respiratory distress.     Breath sounds: No stridor.  Abdominal:     General: There is no distension.  Musculoskeletal:        General: No swelling or deformity.     Cervical back: Neck supple.     Comments: Laceration left lower leg anterior shin, involved skin as well as subcutaneous tissue, oozing of blood but does not stop with direct pressure  Skin:    General: Skin is warm and dry.     Findings: No rash.  Neurological:     Mental Status: He is alert. Mental status is at baseline.     Cranial Nerves: No dysarthria or facial asymmetry.     Motor: No seizure activity.     ED Results / Procedures / Treatments   Labs (all labs ordered are listed, but only abnormal results are  displayed) Labs Reviewed - No data to display  EKG None  Radiology No results found.  Procedures .Marland KitchenLaceration Repair  Date/Time: 04/02/2023 9:45 AM  Performed by: Linwood Dibbles, MD Authorized by: Linwood Dibbles, MD   Consent:    Consent obtained:  Verbal   Consent given by:  Patient Anesthesia:    Anesthesia method:  Local infiltration   Local anesthetic:  Lidocaine 1% WITH epi Laceration details:    Location:  Leg   Leg location:  L lower leg   Length (cm):  14 Pre-procedure details:    Preparation:  Patient was prepped and draped in usual sterile fashion Exploration:    Hemostasis achieved with:  Direct pressure (quickclot)   Wound extent: no foreign body, no signs of injury and no underlying fracture     Contaminated: no   Treatment:    Area cleansed with:  Shur-Clens   Amount of cleaning:  Standard Skin repair:    Repair method:  Sutures   Suture size:  3-0   Suture material:  Prolene   Suture technique:  Horizontal mattress   Number of sutures:  6 Approximation:    Approximation:  Loose Repair type:    Repair type:  Complex Post-procedure details:    Dressing:  Sterile dressing   Procedure completion:  Tolerated well, no immediate complications     Medications Ordered in ED Medications  lidocaine (PF) (XYLOCAINE) 1 % injection (  Given by Other 04/02/23 5638)    ED Course/ Medical Decision Making/ A&P                                 Medical Decision Making Problems Addressed: Leg laceration, left, initial encounter: acute illness or injury that poses a threat to life or bodily functions   Patient with a lower extremity laceration.  Continued to have bleeding.  Avulsion flap type laceration noted but it did involve deeper layer of dermis and subcutaneous tissue.  Horizontal mattress sutures placed to reapproximate wound edges.  Hemostasis achieved        Final Clinical Impression(s) / ED Diagnoses Final diagnoses:  Leg laceration, left, initial  encounter    Rx / DC Orders ED Discharge Orders     None         Linwood Dibbles, MD 04/02/23 [phone]

## 2023-04-02 NOTE — Discharge Instructions (Signed)
Apply antibiotic ointment to the wound daily.  Suture removal recommended in 10 days.  Monitor for signs of infection or other concerns

## 2023-04-03 ENCOUNTER — Other Ambulatory Visit: Payer: Self-pay

## 2023-04-03 ENCOUNTER — Encounter (HOSPITAL_COMMUNITY): Payer: Self-pay

## 2023-04-03 ENCOUNTER — Emergency Department (HOSPITAL_COMMUNITY)
Admission: EM | Admit: 2023-04-03 | Discharge: 2023-04-03 | Disposition: A | Discharge status: Home / Self Care | Payer: No Typology Code available for payment source | Attending: Emergency Medicine | Admitting: Emergency Medicine

## 2023-04-03 DIAGNOSIS — Z7984 Long term (current) use of oral hypoglycemic drugs: Secondary | ICD-10-CM | POA: Diagnosis not present

## 2023-04-03 DIAGNOSIS — E1122 Type 2 diabetes mellitus with diabetic chronic kidney disease: Secondary | ICD-10-CM | POA: Diagnosis not present

## 2023-04-03 DIAGNOSIS — S81812A Laceration without foreign body, left lower leg, initial encounter: Secondary | ICD-10-CM | POA: Insufficient documentation

## 2023-04-03 DIAGNOSIS — W19XXXA Unspecified fall, initial encounter: Secondary | ICD-10-CM | POA: Diagnosis not present

## 2023-04-03 DIAGNOSIS — R58 Hemorrhage, not elsewhere classified: Secondary | ICD-10-CM

## 2023-04-03 DIAGNOSIS — W109XXA Fall (on) (from) unspecified stairs and steps, initial encounter: Secondary | ICD-10-CM | POA: Diagnosis not present

## 2023-04-03 DIAGNOSIS — S81812D Laceration without foreign body, left lower leg, subsequent encounter: Secondary | ICD-10-CM

## 2023-04-03 DIAGNOSIS — N189 Chronic kidney disease, unspecified: Secondary | ICD-10-CM | POA: Insufficient documentation

## 2023-04-03 DIAGNOSIS — Z743 Need for continuous supervision: Secondary | ICD-10-CM | POA: Diagnosis not present

## 2023-04-03 DIAGNOSIS — S8992XA Unspecified injury of left lower leg, initial encounter: Secondary | ICD-10-CM | POA: Diagnosis not present

## 2023-04-03 DIAGNOSIS — Z7401 Bed confinement status: Secondary | ICD-10-CM | POA: Diagnosis not present

## 2023-04-03 DIAGNOSIS — I779 Disorder of arteries and arterioles, unspecified: Secondary | ICD-10-CM | POA: Insufficient documentation

## 2023-04-03 DIAGNOSIS — Z7901 Long term (current) use of anticoagulants: Secondary | ICD-10-CM | POA: Diagnosis not present

## 2023-04-03 LAB — CBC
HCT: 42 % (ref 39.0–52.0)
Hemoglobin: 12.4 g/dL — ABNORMAL LOW (ref 13.0–17.0)
MCH: 24.2 pg — ABNORMAL LOW (ref 26.0–34.0)
MCHC: 29.5 g/dL — ABNORMAL LOW (ref 30.0–36.0)
MCV: 81.9 fL (ref 80.0–100.0)
Platelets: 314 10*3/uL (ref 150–400)
RBC: 5.13 MIL/uL (ref 4.22–5.81)
RDW: 17.2 % — ABNORMAL HIGH (ref 11.5–15.5)
WBC: 15.2 10*3/uL — ABNORMAL HIGH (ref 4.0–10.5)
nRBC: 0 % (ref 0.0–0.2)

## 2023-04-03 MED ORDER — LIDOCAINE-EPINEPHRINE (PF) 2 %-1:200000 IJ SOLN
20.0000 mL | Freq: Once | INTRAMUSCULAR | Status: AC
  Administered 2023-04-03: 20 mL
  Filled 2023-04-03: qty 20

## 2023-04-03 NOTE — Discharge Instructions (Signed)
Evaluation today revealed that he had a slight bleed within your laceration repaired on your left lower leg.  I placed 1 stitch in the bleeding subsided.  If the bleeding returns, or you become short of breath, lightheaded or fatigued or any other concerning symptom please return emergency department further evaluation.  Otherwise recommend that you follow-up with your PCP for wound reevaluation and recheck of your labs.

## 2023-04-03 NOTE — ED Notes (Signed)
Applied dressing and coban to LLE wound.

## 2023-04-03 NOTE — ED Provider Notes (Signed)
[place] EMERGENCY DEPARTMENT AT [facility] Provider Note   CSN: [id number] Arrival date & time: 04/03/23  0547     History  Chief Complaint  Patient presents with   Wound Dehiscence   HPI Tanner [name] is a 77 y.o. male with paroxysmal atrial fibrillation, diabetes, CKD and history of PE presenting for leg wound.  States he tripped and fell down the stairs 2 days ago.  Sustained a skin tear on the left lower mid shin.  Was evaluated here.  Sutures placed and hemostasis was achieved.  Patient was discharged.  States this morning he noticed that the wound started bleeding again.  EMS was called.  They estimate he lost approximately 300 mL of blood.  Patient states he is not short of breath or fatigue at this time.  Patient is on Eliquis.  HPI     Home Medications Prior to Admission medications   Medication Sig Start Date End Date Taking? Authorizing Provider  acetaminophen (TYLENOL) 500 MG tablet Take 1,000-1,500 mg by mouth as needed for moderate pain. 02/21/13   Norins, Rosalyn Gess, MD  amiodarone (PACERONE) 200 MG tablet Take 1 tablet (200 mg total) by mouth daily. 10/21/22   Maisie Fus, MD  apixaban (ELIQUIS) 5 MG TABS tablet Take 5 mg by mouth 2 (two) times daily.    [provider]  atorvastatin (LIPITOR) 80 MG tablet Take 1 tablet (80 mg total) by mouth daily. Patient taking differently: Take 80 mg by mouth every evening. 09/17/22 03/18/23  Myrlene Broker, MD  buPROPion (WELLBUTRIN SR) 150 MG 12 hr tablet Take 150 mg by mouth 2 (two) times daily.    [provider]  carvedilol (COREG) 6.25 MG tablet Take 1 tablet (6.25 mg total) by mouth 2 (two) times daily with a meal. 09/17/22 03/18/23  Myrlene Broker, MD  empagliflozin (JARDIANCE) 25 MG TABS tablet Take 12.5 mg by mouth daily.    [provider]  finasteride (PROSCAR) 5 MG tablet Take 5 mg by mouth at bedtime.    [provider]  Multiple Vitamins-Minerals  (MULTIVITAMINS THER. W/MINERALS) TABS Take 1 tablet by mouth daily.    [provider]  polyethylene glycol powder (MIRALAX) 17 GM/SCOOP powder Take 17 g by mouth 2 (two) times daily as needed for moderate constipation. 03/23/23   Almon Hercules, MD  senna-docusate (SENOKOT-S) 8.6-50 MG tablet Take 1 tablet by mouth 2 (two) times daily between meals as needed for mild constipation. 03/23/23   Almon Hercules, MD  torsemide (DEMADEX) 20 MG tablet TAKE 1 TABLET BY MOUTH EVERY DAY 01/18/23   Myrlene Broker, MD      Allergies    Lisinopril, Amlodipine, and Codeine sulfate    Review of Systems   See HPI for pertinent positives  Physical Exam Updated Vital Signs BP (!) 149/63   Pulse 65   Temp 98.6 F (37 C) (Oral)   Resp 18   Ht 5\' 4"  (1.626 m)   Wt 104.3 kg   SpO2 99%   BMI 39.48 kg/m  Physical Exam Vitals and nursing note reviewed.  HENT:     Head: Normocephalic and atraumatic.     Mouth/Throat:     Mouth: Mucous membranes are moist.  Eyes:     General:        Right eye: No discharge.        Left eye: No discharge.     Conjunctiva/sclera: Conjunctivae normal.  Cardiovascular:  Rate and Rhythm: Normal rate and regular rhythm.     Pulses: Normal pulses.     Heart sounds: Normal heart sounds.  Pulmonary:     Effort: Pulmonary effort is normal.     Breath sounds: Normal breath sounds.  Abdominal:     General: Abdomen is flat.     Palpations: Abdomen is soft.  Musculoskeletal:       Legs:  Skin:    General: Skin is warm and dry.     Coloration: Skin is pale.  Neurological:     General: No focal deficit present.  Psychiatric:        Mood and Affect: Mood normal.     ED Results / Procedures / Treatments   Labs (all labs ordered are listed, but only abnormal results are displayed) Labs Reviewed  CBC - Abnormal; Notable for the following components:      Result Value   WBC 15.2 (*)    Hemoglobin 12.4 (*)    MCH 24.2 (*)    MCHC 29.5 (*)    RDW 17.2  (*)    All other components within normal limits    EKG None  Radiology No results found.  Procedures .Marland KitchenLaceration Repair  Date/Time: 04/03/2023 7:55 AM  Performed by: Gareth Eagle, PA-C Authorized by: Gareth Eagle, PA-C   Consent:    Consent obtained:  Verbal   Consent given by:  Patient   Risks discussed:  Infection, retained foreign body and need for additional repair Universal protocol:    Procedure explained and questions answered to patient or proxy's satisfaction: yes     Relevant documents present and verified: yes     Patient identity confirmed:  Verbally with patient and arm band Anesthesia:    Anesthesia method:  Local infiltration   Local anesthetic:  Lidocaine 1% WITH epi Laceration details:    Location:  Leg   Leg location:  L lower leg   Length (cm):  1 Pre-procedure details:    Preparation:  Patient was prepped and draped in usual sterile fashion Exploration:    Limited defect created (wound extended): no     Hemostasis obtained with: achieved after placing suture.   Wound exploration: wound explored through full range of motion     Wound extent: areolar tissue violated     Contaminated: no   Treatment:    Area cleansed with:  Saline   Amount of cleaning:  Extensive   Irrigation solution:  Sterile saline   Irrigation method:  Pressure wash   Debridement:  None Skin repair:    Repair method:  Sutures   Suture size:  3-0   Suture material:  Prolene   Suture technique:  Horizontal mattress   Number of sutures:  1 Approximation:    Approximation:  Close Repair type:    Repair type:  Simple Post-procedure details:    Dressing:  Non-adherent dressing   Procedure completion:  Tolerated well, no immediate complications     Medications Ordered in ED Medications  lidocaine-EPINEPHrine (XYLOCAINE W/EPI) 2 %-1:200000 (PF) injection 20 mL (20 mLs Infiltration Given 04/03/23 1610)    ED Course/ Medical Decision Making/ A&P                                  Medical Decision Making Amount and/or Complexity of Data Reviewed Labs: ordered.  Risk Prescription drug management.   77 year old well-appearing male presenting for lower  leg laceration reevaluation.  Exam notable for what appeared to be a small superficial arterial bleed within the laceration.  No signs of infection at this time and wound appears well-approximated with residing sutures.  Placed 1 horizontal mattress suture here and hemostasis was achieved.  Labs indicated leukocytosis.  Patient denied cough, fever, nausea vomiting diarrhea, abdominal pain headache.  Stated that he did have pneumonia couple weeks ago and took his antibiotics but no longer has symptoms.  Suspect this could be contributing in someway.  Nevertheless patient looks well overall, no acute distress and hemodynamically stable.  Does not appear to be symptomatically anemic as well.  Advised him to follow-up with his PCP for wound reevaluation and recheck of his labs.  Discussed return precautions.  Vital stable.  Discharged home in good condition.        Final Clinical Impression(s) / ED Diagnoses Final diagnoses:  Laceration of left lower extremity, subsequent encounter  Bleeding    Rx / DC Orders ED Discharge Orders     None         Gareth Eagle, PA-C 04/03/23 0825    Pricilla Loveless, MD 04/03/23 [phone]

## 2023-04-03 NOTE — ED Triage Notes (Addendum)
Pt brought by the EMS from home. PT was recently seen in ED for wound avulsion yesterday pt stated that he was taking step on the stairs and tripped on his left leg. PT was discharge after treatment. But coming back  today because the wounds started bleeding,  As per EMS he had some blood loss of . PT alert and oriented. On Blood Thinners.

## 2023-04-03 NOTE — ED Notes (Signed)
[facility] transport arranged via [employer].

## 2023-04-03 NOTE — ED Notes (Signed)
Pt son Luisa Hart called and notified of pt being discharged and [facility] transport.

## 2023-04-05 ENCOUNTER — Other Ambulatory Visit (INDEPENDENT_AMBULATORY_CARE_PROVIDER_SITE_OTHER): Payer: Self-pay

## 2023-04-05 ENCOUNTER — Inpatient Hospital Stay: Payer: Medicare HMO | Admitting: Internal Medicine

## 2023-04-05 DIAGNOSIS — Z7189 Other specified counseling: Secondary | ICD-10-CM

## 2023-04-05 NOTE — Plan of Care (Signed)
[facility] Tonsillectomy/Adenoidectomy, Postoperative PEDS care plan entered in error.

## 2023-04-05 NOTE — Nursing Note (Signed)
POPULATION HEALTH    DISEASE MANAGEMENT COORDINATOR    [facility] called patient for a monthly follow up. Patient did not answer. [facility] left voicemail with contact information. [facility] will make another call within 48 business hours.       Alden Benjamin, RN, BSN  Disease Management Coordinator  [employer]  [phone]

## 2023-04-06 ENCOUNTER — Emergency Department (HOSPITAL_COMMUNITY): Payer: No Typology Code available for payment source

## 2023-04-06 ENCOUNTER — Telehealth: Payer: Self-pay | Admitting: Internal Medicine

## 2023-04-06 ENCOUNTER — Inpatient Hospital Stay (HOSPITAL_COMMUNITY)
Admission: EM | Admit: 2023-04-06 | Discharge: 2023-04-14 | DRG: 463 | Disposition: A | Discharge status: Skilled Nursing Facility | Payer: No Typology Code available for payment source | Attending: Internal Medicine | Admitting: Internal Medicine

## 2023-04-06 ENCOUNTER — Other Ambulatory Visit: Payer: Self-pay

## 2023-04-06 ENCOUNTER — Other Ambulatory Visit (INDEPENDENT_AMBULATORY_CARE_PROVIDER_SITE_OTHER): Payer: Self-pay

## 2023-04-06 ENCOUNTER — Encounter (HOSPITAL_COMMUNITY): Payer: Self-pay

## 2023-04-06 DIAGNOSIS — E860 Dehydration: Secondary | ICD-10-CM | POA: Diagnosis present

## 2023-04-06 DIAGNOSIS — E66812 Obesity, class 2: Secondary | ICD-10-CM | POA: Diagnosis present

## 2023-04-06 DIAGNOSIS — D62 Acute posthemorrhagic anemia: Secondary | ICD-10-CM | POA: Diagnosis not present

## 2023-04-06 DIAGNOSIS — M4316 Spondylolisthesis, lumbar region: Secondary | ICD-10-CM | POA: Diagnosis not present

## 2023-04-06 DIAGNOSIS — I700 Atherosclerosis of aorta: Secondary | ICD-10-CM | POA: Diagnosis present

## 2023-04-06 DIAGNOSIS — D631 Anemia in chronic kidney disease: Secondary | ICD-10-CM | POA: Diagnosis present

## 2023-04-06 DIAGNOSIS — I5023 Acute on chronic systolic (congestive) heart failure: Secondary | ICD-10-CM | POA: Diagnosis not present

## 2023-04-06 DIAGNOSIS — M549 Dorsalgia, unspecified: Secondary | ICD-10-CM | POA: Diagnosis not present

## 2023-04-06 DIAGNOSIS — B952 Enterococcus as the cause of diseases classified elsewhere: Principal | ICD-10-CM | POA: Diagnosis present

## 2023-04-06 DIAGNOSIS — N281 Cyst of kidney, acquired: Secondary | ICD-10-CM | POA: Diagnosis not present

## 2023-04-06 DIAGNOSIS — Z79899 Other long term (current) drug therapy: Secondary | ICD-10-CM

## 2023-04-06 DIAGNOSIS — D72829 Elevated white blood cell count, unspecified: Secondary | ICD-10-CM

## 2023-04-06 DIAGNOSIS — R531 Weakness: Secondary | ICD-10-CM

## 2023-04-06 DIAGNOSIS — A419 Sepsis, unspecified organism: Secondary | ICD-10-CM | POA: Diagnosis not present

## 2023-04-06 DIAGNOSIS — Z6839 Body mass index (BMI) 39.0-39.9, adult: Secondary | ICD-10-CM

## 2023-04-06 DIAGNOSIS — M25461 Effusion, right knee: Secondary | ICD-10-CM | POA: Diagnosis not present

## 2023-04-06 DIAGNOSIS — R54 Age-related physical debility: Secondary | ICD-10-CM | POA: Diagnosis present

## 2023-04-06 DIAGNOSIS — T8453XD Infection and inflammatory reaction due to internal right knee prosthesis, subsequent encounter: Secondary | ICD-10-CM | POA: Diagnosis not present

## 2023-04-06 DIAGNOSIS — Y92009 Unspecified place in unspecified non-institutional (private) residence as the place of occurrence of the external cause: Secondary | ICD-10-CM | POA: Diagnosis not present

## 2023-04-06 DIAGNOSIS — B955 Unspecified streptococcus as the cause of diseases classified elsewhere: Secondary | ICD-10-CM | POA: Insufficient documentation

## 2023-04-06 DIAGNOSIS — R7881 Bacteremia: Secondary | ICD-10-CM | POA: Diagnosis present

## 2023-04-06 DIAGNOSIS — N2889 Other specified disorders of kidney and ureter: Secondary | ICD-10-CM | POA: Diagnosis not present

## 2023-04-06 DIAGNOSIS — G4733 Obstructive sleep apnea (adult) (pediatric): Secondary | ICD-10-CM | POA: Diagnosis present

## 2023-04-06 DIAGNOSIS — L03115 Cellulitis of right lower limb: Secondary | ICD-10-CM | POA: Diagnosis not present

## 2023-04-06 DIAGNOSIS — M1990 Unspecified osteoarthritis, unspecified site: Secondary | ICD-10-CM | POA: Diagnosis present

## 2023-04-06 DIAGNOSIS — R2681 Unsteadiness on feet: Secondary | ICD-10-CM | POA: Diagnosis not present

## 2023-04-06 DIAGNOSIS — I10 Essential (primary) hypertension: Secondary | ICD-10-CM | POA: Diagnosis not present

## 2023-04-06 DIAGNOSIS — E1122 Type 2 diabetes mellitus with diabetic chronic kidney disease: Secondary | ICD-10-CM | POA: Diagnosis present

## 2023-04-06 DIAGNOSIS — Z043 Encounter for examination and observation following other accident: Secondary | ICD-10-CM | POA: Diagnosis not present

## 2023-04-06 DIAGNOSIS — W1830XA Fall on same level, unspecified, initial encounter: Secondary | ICD-10-CM | POA: Diagnosis present

## 2023-04-06 DIAGNOSIS — R55 Syncope and collapse: Secondary | ICD-10-CM | POA: Diagnosis present

## 2023-04-06 DIAGNOSIS — Z833 Family history of diabetes mellitus: Secondary | ICD-10-CM

## 2023-04-06 DIAGNOSIS — L02419 Cutaneous abscess of limb, unspecified: Secondary | ICD-10-CM

## 2023-04-06 DIAGNOSIS — M6281 Muscle weakness (generalized): Secondary | ICD-10-CM | POA: Diagnosis not present

## 2023-04-06 DIAGNOSIS — R41841 Cognitive communication deficit: Secondary | ICD-10-CM | POA: Diagnosis not present

## 2023-04-06 DIAGNOSIS — N401 Enlarged prostate with lower urinary tract symptoms: Secondary | ICD-10-CM | POA: Diagnosis present

## 2023-04-06 DIAGNOSIS — I5022 Chronic systolic (congestive) heart failure: Secondary | ICD-10-CM | POA: Diagnosis present

## 2023-04-06 DIAGNOSIS — R2689 Other abnormalities of gait and mobility: Secondary | ICD-10-CM | POA: Diagnosis not present

## 2023-04-06 DIAGNOSIS — N179 Acute kidney failure, unspecified: Secondary | ICD-10-CM | POA: Diagnosis present

## 2023-04-06 DIAGNOSIS — R6520 Severe sepsis without septic shock: Secondary | ICD-10-CM | POA: Diagnosis not present

## 2023-04-06 DIAGNOSIS — I480 Paroxysmal atrial fibrillation: Secondary | ICD-10-CM | POA: Diagnosis not present

## 2023-04-06 DIAGNOSIS — T8453XA Infection and inflammatory reaction due to internal right knee prosthesis, initial encounter: Principal | ICD-10-CM | POA: Diagnosis present

## 2023-04-06 DIAGNOSIS — S0990XA Unspecified injury of head, initial encounter: Secondary | ICD-10-CM | POA: Diagnosis not present

## 2023-04-06 DIAGNOSIS — B9562 Methicillin resistant Staphylococcus aureus infection as the cause of diseases classified elsewhere: Secondary | ICD-10-CM | POA: Diagnosis present

## 2023-04-06 DIAGNOSIS — J439 Emphysema, unspecified: Secondary | ICD-10-CM | POA: Diagnosis not present

## 2023-04-06 DIAGNOSIS — Z86711 Personal history of pulmonary embolism: Secondary | ICD-10-CM

## 2023-04-06 DIAGNOSIS — M47816 Spondylosis without myelopathy or radiculopathy, lumbar region: Secondary | ICD-10-CM | POA: Diagnosis not present

## 2023-04-06 DIAGNOSIS — E1141 Type 2 diabetes mellitus with diabetic mononeuropathy: Secondary | ICD-10-CM | POA: Diagnosis present

## 2023-04-06 DIAGNOSIS — W19XXXA Unspecified fall, initial encounter: Secondary | ICD-10-CM

## 2023-04-06 DIAGNOSIS — Y831 Surgical operation with implant of artificial internal device as the cause of abnormal reaction of the patient, or of later complication, without mention of misadventure at the time of the procedure: Secondary | ICD-10-CM | POA: Diagnosis present

## 2023-04-06 DIAGNOSIS — E785 Hyperlipidemia, unspecified: Secondary | ICD-10-CM | POA: Diagnosis present

## 2023-04-06 DIAGNOSIS — Z885 Allergy status to narcotic agent status: Secondary | ICD-10-CM

## 2023-04-06 DIAGNOSIS — N1832 Chronic kidney disease, stage 3b: Secondary | ICD-10-CM | POA: Diagnosis present

## 2023-04-06 DIAGNOSIS — Z8349 Family history of other endocrine, nutritional and metabolic diseases: Secondary | ICD-10-CM

## 2023-04-06 DIAGNOSIS — K219 Gastro-esophageal reflux disease without esophagitis: Secondary | ICD-10-CM | POA: Diagnosis not present

## 2023-04-06 DIAGNOSIS — Z820 Family history of epilepsy and other diseases of the nervous system: Secondary | ICD-10-CM

## 2023-04-06 DIAGNOSIS — M545 Low back pain, unspecified: Secondary | ICD-10-CM | POA: Diagnosis not present

## 2023-04-06 DIAGNOSIS — Z7984 Long term (current) use of oral hypoglycemic drugs: Secondary | ICD-10-CM

## 2023-04-06 DIAGNOSIS — R338 Other retention of urine: Secondary | ICD-10-CM | POA: Diagnosis present

## 2023-04-06 DIAGNOSIS — N183 Chronic kidney disease, stage 3 unspecified: Secondary | ICD-10-CM | POA: Diagnosis not present

## 2023-04-06 DIAGNOSIS — D649 Anemia, unspecified: Secondary | ICD-10-CM | POA: Diagnosis present

## 2023-04-06 DIAGNOSIS — Z86718 Personal history of other venous thrombosis and embolism: Secondary | ICD-10-CM

## 2023-04-06 DIAGNOSIS — R1312 Dysphagia, oropharyngeal phase: Secondary | ICD-10-CM | POA: Diagnosis not present

## 2023-04-06 DIAGNOSIS — Z743 Need for continuous supervision: Secondary | ICD-10-CM | POA: Diagnosis not present

## 2023-04-06 DIAGNOSIS — R278 Other lack of coordination: Secondary | ICD-10-CM | POA: Diagnosis not present

## 2023-04-06 DIAGNOSIS — E782 Mixed hyperlipidemia: Secondary | ICD-10-CM

## 2023-04-06 DIAGNOSIS — R911 Solitary pulmonary nodule: Secondary | ICD-10-CM | POA: Diagnosis not present

## 2023-04-06 DIAGNOSIS — E119 Type 2 diabetes mellitus without complications: Secondary | ICD-10-CM

## 2023-04-06 DIAGNOSIS — Z818 Family history of other mental and behavioral disorders: Secondary | ICD-10-CM

## 2023-04-06 DIAGNOSIS — E1121 Type 2 diabetes mellitus with diabetic nephropathy: Secondary | ICD-10-CM | POA: Diagnosis not present

## 2023-04-06 DIAGNOSIS — L02415 Cutaneous abscess of right lower limb: Secondary | ICD-10-CM | POA: Diagnosis present

## 2023-04-06 DIAGNOSIS — Z96651 Presence of right artificial knee joint: Secondary | ICD-10-CM | POA: Diagnosis not present

## 2023-04-06 DIAGNOSIS — I130 Hypertensive heart and chronic kidney disease with heart failure and stage 1 through stage 4 chronic kidney disease, or unspecified chronic kidney disease: Secondary | ICD-10-CM | POA: Diagnosis present

## 2023-04-06 DIAGNOSIS — E1142 Type 2 diabetes mellitus with diabetic polyneuropathy: Secondary | ICD-10-CM | POA: Diagnosis not present

## 2023-04-06 DIAGNOSIS — J189 Pneumonia, unspecified organism: Secondary | ICD-10-CM | POA: Diagnosis not present

## 2023-04-06 DIAGNOSIS — S199XXA Unspecified injury of neck, initial encounter: Secondary | ICD-10-CM | POA: Diagnosis not present

## 2023-04-06 DIAGNOSIS — Z8701 Personal history of pneumonia (recurrent): Secondary | ICD-10-CM

## 2023-04-06 DIAGNOSIS — N189 Chronic kidney disease, unspecified: Secondary | ICD-10-CM | POA: Diagnosis not present

## 2023-04-06 DIAGNOSIS — D6489 Other specified anemias: Secondary | ICD-10-CM | POA: Diagnosis present

## 2023-04-06 DIAGNOSIS — E1143 Type 2 diabetes mellitus with diabetic autonomic (poly)neuropathy: Secondary | ICD-10-CM | POA: Diagnosis not present

## 2023-04-06 DIAGNOSIS — R471 Dysarthria and anarthria: Secondary | ICD-10-CM | POA: Diagnosis not present

## 2023-04-06 DIAGNOSIS — E7800 Pure hypercholesterolemia, unspecified: Secondary | ICD-10-CM | POA: Diagnosis present

## 2023-04-06 DIAGNOSIS — N170 Acute kidney failure with tubular necrosis: Secondary | ICD-10-CM | POA: Diagnosis not present

## 2023-04-06 DIAGNOSIS — I509 Heart failure, unspecified: Secondary | ICD-10-CM | POA: Diagnosis not present

## 2023-04-06 DIAGNOSIS — Z87891 Personal history of nicotine dependence: Secondary | ICD-10-CM

## 2023-04-06 DIAGNOSIS — Z8614 Personal history of Methicillin resistant Staphylococcus aureus infection: Secondary | ICD-10-CM | POA: Diagnosis not present

## 2023-04-06 DIAGNOSIS — R296 Repeated falls: Secondary | ICD-10-CM | POA: Diagnosis present

## 2023-04-06 DIAGNOSIS — F329 Major depressive disorder, single episode, unspecified: Secondary | ICD-10-CM | POA: Diagnosis not present

## 2023-04-06 DIAGNOSIS — Z96652 Presence of left artificial knee joint: Secondary | ICD-10-CM | POA: Diagnosis present

## 2023-04-06 DIAGNOSIS — R609 Edema, unspecified: Secondary | ICD-10-CM | POA: Diagnosis not present

## 2023-04-06 DIAGNOSIS — J9601 Acute respiratory failure with hypoxia: Secondary | ICD-10-CM | POA: Diagnosis not present

## 2023-04-06 DIAGNOSIS — G9341 Metabolic encephalopathy: Secondary | ICD-10-CM | POA: Diagnosis not present

## 2023-04-06 DIAGNOSIS — M47817 Spondylosis without myelopathy or radiculopathy, lumbosacral region: Secondary | ICD-10-CM | POA: Diagnosis not present

## 2023-04-06 DIAGNOSIS — J690 Pneumonitis due to inhalation of food and vomit: Secondary | ICD-10-CM | POA: Diagnosis not present

## 2023-04-06 DIAGNOSIS — Z888 Allergy status to other drugs, medicaments and biological substances status: Secondary | ICD-10-CM

## 2023-04-06 DIAGNOSIS — Z7901 Long term (current) use of anticoagulants: Secondary | ICD-10-CM

## 2023-04-06 DIAGNOSIS — Z7189 Other specified counseling: Secondary | ICD-10-CM

## 2023-04-06 LAB — CBC WITH DIFFERENTIAL/PLATELET
Abs Immature Granulocytes: 0.28 10*3/uL — ABNORMAL HIGH (ref 0.00–0.07)
Basophils Absolute: 0.1 10*3/uL (ref 0.0–0.1)
Basophils Relative: 0 %
Eosinophils Absolute: 0.1 10*3/uL (ref 0.0–0.5)
Eosinophils Relative: 0 %
HCT: 34.3 % — ABNORMAL LOW (ref 39.0–52.0)
Hemoglobin: 10.3 g/dL — ABNORMAL LOW (ref 13.0–17.0)
Immature Granulocytes: 1 %
Lymphocytes Relative: 10 %
Lymphs Abs: 2 10*3/uL (ref 0.7–4.0)
MCH: 23.8 pg — ABNORMAL LOW (ref 26.0–34.0)
MCHC: 30 g/dL (ref 30.0–36.0)
MCV: 79.2 fL — ABNORMAL LOW (ref 80.0–100.0)
Monocytes Absolute: 1.3 10*3/uL — ABNORMAL HIGH (ref 0.1–1.0)
Monocytes Relative: 6 %
Neutro Abs: 17.1 10*3/uL — ABNORMAL HIGH (ref 1.7–7.7)
Neutrophils Relative %: 83 %
Platelets: 389 10*3/uL (ref 150–400)
RBC: 4.33 MIL/uL (ref 4.22–5.81)
RDW: 17.5 % — ABNORMAL HIGH (ref 11.5–15.5)
WBC: 20.8 10*3/uL — ABNORMAL HIGH (ref 4.0–10.5)
nRBC: 0 % (ref 0.0–0.2)

## 2023-04-06 LAB — CBC
HCT: 33.6 % — ABNORMAL LOW (ref 39.0–52.0)
Hemoglobin: 10 g/dL — ABNORMAL LOW (ref 13.0–17.0)
MCH: 23.6 pg — ABNORMAL LOW (ref 26.0–34.0)
MCHC: 29.8 g/dL — ABNORMAL LOW (ref 30.0–36.0)
MCV: 79.2 fL — ABNORMAL LOW (ref 80.0–100.0)
Platelets: 433 10*3/uL — ABNORMAL HIGH (ref 150–400)
RBC: 4.24 MIL/uL (ref 4.22–5.81)
RDW: 17.5 % — ABNORMAL HIGH (ref 11.5–15.5)
WBC: 21.5 10*3/uL — ABNORMAL HIGH (ref 4.0–10.5)
nRBC: 0 % (ref 0.0–0.2)

## 2023-04-06 LAB — HEPATIC FUNCTION PANEL
ALT: 27 U/L (ref 0–44)
AST: 36 U/L (ref 15–41)
Albumin: 2.5 g/dL — ABNORMAL LOW (ref 3.5–5.0)
Alkaline Phosphatase: 87 U/L (ref 38–126)
Bilirubin, Direct: 0.2 mg/dL (ref 0.0–0.2)
Indirect Bilirubin: 0.3 mg/dL (ref 0.3–0.9)
Total Bilirubin: 0.5 mg/dL (ref 0.3–1.2)
Total Protein: 5.8 g/dL — ABNORMAL LOW (ref 6.5–8.1)

## 2023-04-06 LAB — URINALYSIS, W/ REFLEX TO CULTURE (INFECTION SUSPECTED)
Bilirubin Urine: NEGATIVE
Glucose, UA: >=500 mg/dL — AB
Hgb urine dipstick: NEGATIVE
Ketones, ur: NEGATIVE mg/dL
Leukocytes,Ua: NEGATIVE
Nitrite: NEGATIVE
Protein, ur: NEGATIVE mg/dL
Specific Gravity, Urine: 1.017 (ref 1.005–1.030)
pH: 5 (ref 5.0–8.0)

## 2023-04-06 LAB — BASIC METABOLIC PANEL
Anion gap: 14 (ref 5–15)
BUN: 57 mg/dL — ABNORMAL HIGH (ref 8–23)
CO2: 22 mmol/L (ref 22–32)
Calcium: 8.4 mg/dL — ABNORMAL LOW (ref 8.9–10.3)
Chloride: 98 mmol/L (ref 98–111)
Creatinine, Ser: 4.87 mg/dL — ABNORMAL HIGH (ref 0.61–1.24)
GFR, Estimated: 12 mL/min — ABNORMAL LOW (ref >60)
Glucose, Bld: 91 mg/dL (ref 70–99)
Potassium: 4.2 mmol/L (ref 3.5–5.1)
Sodium: 134 mmol/L — ABNORMAL LOW (ref 135–145)

## 2023-04-06 LAB — LIPASE, BLOOD: Lipase: 37 U/L (ref 11–51)

## 2023-04-06 LAB — BRAIN NATRIURETIC PEPTIDE: B Natriuretic Peptide: 280.2 pg/mL — ABNORMAL HIGH (ref 0.0–100.0)

## 2023-04-06 LAB — CBG MONITORING, ED: Glucose-Capillary: 76 mg/dL (ref 70–99)

## 2023-04-06 LAB — I-STAT CG4 LACTIC ACID, ED: Lactic Acid, Venous: 1.7 mmol/L (ref 0.5–1.9)

## 2023-04-06 LAB — TROPONIN I (HIGH SENSITIVITY)
Troponin I (High Sensitivity): 22 ng/L — ABNORMAL HIGH (ref <18)
Troponin I (High Sensitivity): 21 ng/L — ABNORMAL HIGH (ref <18)

## 2023-04-06 LAB — MAGNESIUM: Magnesium: 2.2 mg/dL (ref 1.7–2.4)

## 2023-04-06 LAB — CK: Total CK: 739 U/L — ABNORMAL HIGH (ref 49–397)

## 2023-04-06 MED ORDER — SODIUM CHLORIDE 0.9 % IV SOLN: 1.0000 g | INTRAVENOUS | Status: DC

## 2023-04-06 MED ORDER — SODIUM CHLORIDE 0.9 % IV BOLUS
500.0000 mL | Freq: Once | INTRAVENOUS | Status: AC
  Administered 2023-04-06: 500 mL via INTRAVENOUS

## 2023-04-06 MED ORDER — MELATONIN 3 MG PO TABS
3.0000 mg | ORAL_TABLET | Freq: Every evening | ORAL | Status: DC | PRN
  Administered 2023-04-10 – 2023-04-13 (×3): 3 mg via ORAL
  Filled 2023-04-06 (×3): qty 1

## 2023-04-06 MED ORDER — SODIUM CHLORIDE 0.9 % IV BOLUS
1000.0000 mL | Freq: Once | INTRAVENOUS | Status: AC
  Administered 2023-04-06: 1000 mL via INTRAVENOUS

## 2023-04-06 MED ORDER — FENTANYL CITRATE PF 50 MCG/ML IJ SOSY: 25.0000 ug | PREFILLED_SYRINGE | INTRAMUSCULAR | Status: DC | PRN

## 2023-04-06 MED ORDER — ACETAMINOPHEN 650 MG RE SUPP: 650.0000 mg | Freq: Four times a day (QID) | RECTAL | Status: DC | PRN

## 2023-04-06 MED ORDER — LIDOCAINE HCL (PF) 1 % IJ SOLN
30.0000 mL | Freq: Once | INTRAMUSCULAR | Status: AC
  Administered 2023-04-06: 30 mL
  Filled 2023-04-06: qty 30

## 2023-04-06 MED ORDER — VANCOMYCIN VARIABLE DOSE PER UNSTABLE RENAL FUNCTION (PHARMACIST DOSING): Status: DC

## 2023-04-06 MED ORDER — LACTATED RINGERS IV SOLN: INTRAVENOUS | Status: AC

## 2023-04-06 MED ORDER — ONDANSETRON HCL 4 MG/2ML IJ SOLN: 4.0000 mg | Freq: Four times a day (QID) | INTRAMUSCULAR | Status: DC | PRN

## 2023-04-06 MED ORDER — NALOXONE HCL 0.4 MG/ML IJ SOLN: 0.4000 mg | INTRAMUSCULAR | Status: DC | PRN

## 2023-04-06 MED ORDER — SODIUM CHLORIDE 0.9 % IV SOLN
2.0000 g | Freq: Once | INTRAVENOUS | Status: AC
  Administered 2023-04-06: 2 g via INTRAVENOUS
  Filled 2023-04-06: qty 20

## 2023-04-06 MED ORDER — ACETAMINOPHEN 325 MG PO TABS
650.0000 mg | ORAL_TABLET | Freq: Four times a day (QID) | ORAL | Status: DC | PRN
  Administered 2023-04-07: 650 mg via ORAL
  Filled 2023-04-06: qty 2

## 2023-04-06 MED ORDER — VANCOMYCIN HCL 2000 MG/400ML IV SOLN
2000.0000 mg | Freq: Once | INTRAVENOUS | Status: AC
  Administered 2023-04-06: 2000 mg via INTRAVENOUS
  Filled 2023-04-06: qty 400

## 2023-04-06 MED ORDER — NYSTATIN 100000 UNIT/GM EX POWD
Freq: Once | CUTANEOUS | Status: AC
  Filled 2023-04-06: qty 15

## 2023-04-06 NOTE — ED Notes (Signed)
Pt bladder scanned and noted to have >500cc.

## 2023-04-06 NOTE — ED Provider Notes (Signed)
[place] EMERGENCY DEPARTMENT AT [facility] Provider Note  CSN: [id number] Arrival date & time: 04/06/23 1330  Chief Complaint(s) Loss of Consciousness  HPI Tanner [name] is a 77 y.o. male history of diabetes, DVT on anticoagulation, paroxysmal A-fib, prior PE, relatively recent right knee replacement, recent admission for pneumonia, right knee cellulitis presenting with fall.  Patient was found home after a fall.  He reports primarily low back pain.  Not sure if he hit his head.  Denies neck pain.  No chest or abdominal pain.  Reports that he has a rash on his bottom.  Patient denies fevers, reports chills.  Reports generalized weakness.  Reports that he sleeps in his recliner and does not get up much.  Thinks he has fallen some other times but he is not sure.  Apparently was in a nursing home and checked out AMA.  He lives alone.  His son found him on the ground today.  He was down for maybe 12 hours.  He denies any nausea, vomiting, diarrhea.   Past Medical History Past Medical History:  Diagnosis Date   Acute kidney failure (HCC)    Anxiety    Arthritis    Back pain    BPH (benign prostatic hypertrophy)    CHF (congestive heart failure) (HCC)    Clostridium difficile infection    Clotting disorder (HCC)    Depression    Diabetes (HCC) 12/11/2016   type 2    DVT (deep venous thrombosis) (HCC)    DVT of deep femoral vein, right (HCC) 06/29/2018   Edema, lower extremity    High cholesterol    Hypertension    Joint pain    Low back pain potentially associated with spinal stenosis    Neuromuscular disorder (HCC)    Neuropathy of lower extremity    bilateral   OSA (obstructive sleep apnea)    pt denies    Osteoarthritis    PE (pulmonary embolism)    Pneumonia    hx of x 2    Ventral hernia    Patient Active Problem List   Diagnosis Date Noted   Aspiration pneumonia (HCC) 03/18/2023   Pressure injury of skin 03/18/2023   Severe sepsis (HCC) 03/18/2023    S/P total knee arthroplasty, right 12/15/2022   Acute respiratory failure with hypoxemia (HCC) 10/22/2022   Paroxysmal atrial fibrillation (HCC) 08/19/2022   Jaw pain 06/24/2022   Left hand pain 05/29/2022   Chronic systolic heart failure (HCC) 02/23/2022   Moderate mitral regurgitation 02/23/2022   Leg swelling 01/29/2022   ED (erectile dysfunction) 01/13/2021   Cyst, dermoid, scalp and neck 01/13/2021   Chronic kidney disease (CKD), stage III (moderate) (HCC) 12/13/2019   Carpal tunnel syndrome 12/05/2019   Elevated creatine kinase 11/15/2019   Aortic atherosclerosis (HCC) 11/15/2019   Recurrent Clostridium difficile diarrhea 05/28/2017   Peripheral neuropathy 03/17/2017   Diabetes (HCC) 12/11/2016   Allergic rhinitis 12/11/2016   Degenerative arthritis of left knee 01/30/2016   Morbid obesity (HCC) 01/30/2016   Routine general medical examination at a health care facility 04/20/2014   History of pulmonary embolus (PE) 12/19/2013   Anxiety state 03/29/2007   Depression 03/29/2007   OSA (obstructive sleep apnea) 03/29/2007   Unspecified glaucoma 03/29/2007   BPH (benign prostatic hyperplasia) 03/29/2007   OA (osteoarthritis) of knee 03/29/2007   Essential hypertension 03/29/2007   History of Bell's palsy 03/29/2007   Home Medication(s) Prior to Admission medications   Medication Sig Start Date End  Date Taking? Authorizing Provider  acetaminophen (TYLENOL) 500 MG tablet Take 1,000-1,500 mg by mouth as needed for moderate pain. 02/21/13   Norins, Rosalyn Gess, MD  amiodarone (PACERONE) 200 MG tablet Take 1 tablet (200 mg total) by mouth daily. 10/21/22   Maisie Fus, MD  apixaban (ELIQUIS) 5 MG TABS tablet Take 5 mg by mouth 2 (two) times daily.    [provider]  atorvastatin (LIPITOR) 80 MG tablet Take 1 tablet (80 mg total) by mouth daily. Patient taking differently: Take 80 mg by mouth every evening. 09/17/22 03/18/23  Myrlene Broker, MD  buPROPion (WELLBUTRIN SR)  150 MG 12 hr tablet Take 150 mg by mouth 2 (two) times daily.    [provider]  carvedilol (COREG) 6.25 MG tablet Take 1 tablet (6.25 mg total) by mouth 2 (two) times daily with a meal. 09/17/22 03/18/23  Myrlene Broker, MD  empagliflozin (JARDIANCE) 25 MG TABS tablet Take 12.5 mg by mouth daily.    [provider]  finasteride (PROSCAR) 5 MG tablet Take 5 mg by mouth at bedtime.    [provider]  Multiple Vitamins-Minerals (MULTIVITAMINS THER. W/MINERALS) TABS Take 1 tablet by mouth daily.    [provider]  polyethylene glycol powder (MIRALAX) 17 GM/SCOOP powder Take 17 g by mouth 2 (two) times daily as needed for moderate constipation. 03/23/23   Almon Hercules, MD  senna-docusate (SENOKOT-S) 8.6-50 MG tablet Take 1 tablet by mouth 2 (two) times daily between meals as needed for mild constipation. 03/23/23   Almon Hercules, MD  torsemide (DEMADEX) 20 MG tablet TAKE 1 TABLET BY MOUTH EVERY DAY 01/18/23   Myrlene Broker, MD                                                                                                                                    Past Surgical History Past Surgical History:  Procedure Laterality Date   CARDIOVERSION N/A 08/19/2022   Procedure: CARDIOVERSION;  Surgeon: Meriam Sprague, MD;  Location: [facility] ENDOSCOPY;  Service: Cardiovascular;  Laterality: N/A;   EYE SURGERY     HERNIA REPAIR  1999   TOTAL KNEE ARTHROPLASTY Left 08/26/2020   Procedure: LEFT TOTAL KNEE ARTHROPLASTY;  Surgeon: Gean Birchwood, MD;  Location: [facility] ORS;  Service: Orthopedics;  Laterality: Left;   TOTAL KNEE ARTHROPLASTY Right 12/15/2022   Procedure: TOTAL KNEE ARTHROPLASTY;  Surgeon: Durene Romans, MD;  Location: [facility] ORS;  Service: Orthopedics;  Laterality: Right;   Family History Family History  Problem Relation Age of Onset   Diabetes Mother    Pneumonia Mother    Alzheimer's disease Mother    Hyperlipidemia Mother    Thyroid disease Mother     Depression Mother    Other Father 31       Drowned on boating accident   Colon cancer Neg Hx    Colon polyps Neg Hx  Social History Social History   Tobacco Use   Smoking status: Former    Current packs/day: 0.00    Average packs/day: 2.0 packs/day for 33.0 years (66.1 ttl pk-yrs)    Types: Cigarettes    Start date: 08/30/1968    Quit date: 07/02/2001    Years since quitting: 21.7   Smokeless tobacco: Never   Tobacco comments:    quit 12 years ago  Vaping Use   Vaping status: Never Used  Substance Use Topics   Alcohol use: Yes    Comment: seldom    Drug use: No   Allergies Lisinopril, Amlodipine, and Codeine sulfate  Review of Systems Review of Systems  All other systems reviewed and are negative.   Physical Exam Vital Signs  I have reviewed the triage vital signs BP 109/75   Pulse 99   Temp 97.7 F (36.5 C) (Oral)   Resp 15   Ht 5\' 4"  (1.626 m)   Wt 104.3 kg   SpO2 96%   BMI 39.47 kg/m  Physical Exam Vitals and nursing note reviewed.  Constitutional:      General: He is not in acute distress.    Comments: Frail, chronically ill-appearing.  HENT:     Head: Normocephalic and atraumatic.     Mouth/Throat:     Mouth: Mucous membranes are dry.  Eyes:     Conjunctiva/sclera: Conjunctivae normal.  Cardiovascular:     Rate and Rhythm: Normal rate and regular rhythm.  Pulmonary:     Effort: Pulmonary effort is normal. No respiratory distress.     Breath sounds: Normal breath sounds.  Abdominal:     General: Abdomen is flat.     Palpations: Abdomen is soft.     Tenderness: There is no abdominal tenderness.  Genitourinary:    Comments: Chaperoned by RN, some redness around bottom with central stage II pressure wound Musculoskeletal:     Comments: Chronic venous stasis changes bilateral shins.  Some superficial wounds over the left anterior shin.  Full active range of motion of bilateral upper and lower extremities with no focal tenderness or deformity.   No midline C, T spine tenderness, mild L-spine tenderness without step-off  Skin:    General: Skin is warm and dry.     Capillary Refill: Capillary refill takes less than 2 seconds.     Comments: Area of erythema and warmth over the right anterior knee, extending superiorly with approximately 1 x 1 cm small abscess  Neurological:     Mental Status: He is alert and oriented to person, place, and time. Mental status is at baseline.  Psychiatric:        Mood and Affect: Mood normal.        Behavior: Behavior normal.     ED Results and Treatments Labs (all labs ordered are listed, but only abnormal results are displayed) Labs Reviewed  CBC - Abnormal; Notable for the following components:      Result Value   WBC 21.5 (*)    Hemoglobin 10.0 (*)    HCT 33.6 (*)    MCV 79.2 (*)    MCH 23.6 (*)    MCHC 29.8 (*)    RDW 17.5 (*)    Platelets 433 (*)    All other components within normal limits  BRAIN NATRIURETIC PEPTIDE - Abnormal; Notable for the following components:   B Natriuretic Peptide 280.2 (*)    All other components within normal limits  CBC WITH DIFFERENTIAL/PLATELET - Abnormal; Notable for  the following components:   WBC 20.8 (*)    Hemoglobin 10.3 (*)    HCT 34.3 (*)    MCV 79.2 (*)    MCH 23.8 (*)    RDW 17.5 (*)    Neutro Abs 17.1 (*)    Monocytes Absolute 1.3 (*)    Abs Immature Granulocytes 0.28 (*)    All other components within normal limits  CULTURE, BLOOD (ROUTINE X 2)  CULTURE, BLOOD (ROUTINE X 2)  URINALYSIS, W/ REFLEX TO CULTURE (INFECTION SUSPECTED)  DIFFERENTIAL  BASIC METABOLIC PANEL  CK  HEPATIC FUNCTION PANEL  LIPASE, BLOOD  I-STAT CG4 LACTIC ACID, ED  I-STAT CG4 LACTIC ACID, ED  TROPONIN I (HIGH SENSITIVITY)  TROPONIN I (HIGH SENSITIVITY)                                                                                                                          Radiology No results found.  Pertinent labs & imaging results that were available  during my care of the patient were reviewed by me and considered in my medical decision making (see MDM for details).  Medications Ordered in ED Medications  cefTRIAXone (ROCEPHIN) 2 g in sodium chloride 0.9 % 100 mL IVPB (has no administration in time range)  vancomycin (VANCOREADY) IVPB 2000 mg/400 mL (has no administration in time range)  sodium chloride 0.9 % bolus 500 mL (500 mLs Intravenous New Bag/Given 04/06/23 1618)                                                                                                                                     Procedures Procedures  (including critical care time)  Medical Decision Making / ED Course   MDM:  77 year old male presenting to the emergency department with fall.  On exam, patient is chronically ill-appearing and frail.  He reports the primary pain he is from a fall is low back pain.  He does have some lumbar tenderness on exam.  He does not have any sign of extremity trauma.  He is not sure if he hit his head but is on Eliquis will obtain CT head and CT cervical spine.  Patient is frail and lives alone.  He has signs of cellulitis on his right knee.  His range of motion of his right knee is intact, lower concern for septic joint.  Appears that this was present when he  was admitted to the hospital previously.  He also has a small abscess in the superior aspect of his cellulitis.  See procedure note for details.  Will give vancomycin and ceftriaxone.  He has leukocytosis, suspect this is primarily the cause of his symptoms, lower concern for other cause such as intra-abdominal process with no abdominal tenderness, lungs clear on exam but will check chest x-ray given recent pneumonia.  He does have urinary retention, will obtain catheter ascites urine to evaluate for urinary infection.  Given his significant weakness, signs of infection, frequent falls, he lives alone, leukocytosis, patient needs to be admitted for further management  pending remainder of his workup.  Signed out to oncoming provider pending results of his testing.      Additional history obtained: -Additional history obtained from ems -External records from outside source obtained and reviewed including: Chart review including previous notes, labs, imaging, consultation notes including prior ER visits and hospitalizations   Lab Tests: -I ordered, reviewed, and interpreted labs.   The pertinent results include:   Labs Reviewed  CBC - Abnormal; Notable for the following components:      Result Value   WBC 21.5 (*)    Hemoglobin 10.0 (*)    HCT 33.6 (*)    MCV 79.2 (*)    MCH 23.6 (*)    MCHC 29.8 (*)    RDW 17.5 (*)    Platelets 433 (*)    All other components within normal limits  BRAIN NATRIURETIC PEPTIDE - Abnormal; Notable for the following components:   B Natriuretic Peptide 280.2 (*)    All other components within normal limits  CBC WITH DIFFERENTIAL/PLATELET - Abnormal; Notable for the following components:   WBC 20.8 (*)    Hemoglobin 10.3 (*)    HCT 34.3 (*)    MCV 79.2 (*)    MCH 23.8 (*)    RDW 17.5 (*)    Neutro Abs 17.1 (*)    Monocytes Absolute 1.3 (*)    Abs Immature Granulocytes 0.28 (*)    All other components within normal limits  CULTURE, BLOOD (ROUTINE X 2)  CULTURE, BLOOD (ROUTINE X 2)  URINALYSIS, W/ REFLEX TO CULTURE (INFECTION SUSPECTED)  DIFFERENTIAL  BASIC METABOLIC PANEL  CK  HEPATIC FUNCTION PANEL  LIPASE, BLOOD  I-STAT CG4 LACTIC ACID, ED  I-STAT CG4 LACTIC ACID, ED  TROPONIN I (HIGH SENSITIVITY)  TROPONIN I (HIGH SENSITIVITY)    Notable for leukocytosis, normal lactic acid.   EKG   EKG Interpretation Date/Time:  Tuesday April 06 2023 13:44:58 EDT Ventricular Rate:  88 PR Interval:    QRS Duration:  199 QT Interval:  535 QTC Calculation: 526 R Axis:   -55  Text Interpretation: Atrial fibrillation RBBB and LAFB Left ventricular hypertrophy Artifact in lead(s) I III aVR aVL V1 Confirmed by  Alvino Blood ([id number]) on 04/06/2023 2:20:29 PM         Imaging Studies ordered: I ordered imaging studies including CXR On my interpretation imaging demonstrates similar as previous  I independently visualized and interpreted imaging. I agree with the radiologist interpretation   Medicines ordered and prescription drug management: Meds ordered this encounter  Medications   sodium chloride 0.9 % bolus 500 mL   cefTRIAXone (ROCEPHIN) 2 g in sodium chloride 0.9 % 100 mL IVPB    Order Specific Question:   Antibiotic Indication:    Answer:   Cellulitis   vancomycin (VANCOREADY) IVPB 2000 mg/400 mL    Order Specific Question:  Indication:    Answer:   Cellulitis    -I have reviewed the patients home medicines and have made adjustments as needed   Cardiac Monitoring: The patient was maintained on a cardiac monitor.  I personally viewed and interpreted the cardiac monitored which showed an underlying rhythm of: afib  Social Determinants of Health:  Diagnosis or treatment significantly limited by social determinants of health: obesity and lives alone   Reevaluation: After the interventions noted above, I reevaluated the patient and found that their symptoms have improved  Co morbidities that complicate the patient evaluation  Past Medical History:  Diagnosis Date   Acute kidney failure (HCC)    Anxiety    Arthritis    Back pain    BPH (benign prostatic hypertrophy)    CHF (congestive heart failure) (HCC)    Clostridium difficile infection    Clotting disorder (HCC)    Depression    Diabetes (HCC) 12/11/2016   type 2    DVT (deep venous thrombosis) (HCC)    DVT of deep femoral vein, right (HCC) 06/29/2018   Edema, lower extremity    High cholesterol    Hypertension    Joint pain    Low back pain potentially associated with spinal stenosis    Neuromuscular disorder (HCC)    Neuropathy of lower extremity    bilateral   OSA (obstructive sleep apnea)    pt  denies    Osteoarthritis    PE (pulmonary embolism)    Pneumonia    hx of x 2    Ventral hernia       Dispostion: Disposition decision including need for hospitalization was considered, and patient disposition pending at time of sign out.    Final Clinical Impression(s) / ED Diagnoses Final diagnoses:  Cellulitis of right knee  Leukocytosis, unspecified type  Frequent falls  Generalized weakness     This chart was dictated using voice recognition software.  Despite best efforts to proofread,  errors can occur which can change the documentation meaning.    Lonell Grandchild, MD 04/06/23 1620

## 2023-04-06 NOTE — Progress Notes (Signed)
Pharmacy Antibiotic Note  Tanner [name] is a 77 y.o. male admitted on 04/06/2023 with cellulitis/abscess of right knee.  Pharmacy has been consulted for vancomycin dosing.  Received vancomycin 2000mg  IV x1 loading dose Scr 4.87 (BL ~1.7?) Right knee abscess with purulent drainage  WBC 20.8, Tm 98.2 afebrile, BP 129/70, RR 17  Plan: Variable vancomycin dosing due to unstable renal function Obtain levels as clinically indicated per pharmacy protocol Monitor CBC, renal function, signs of clinical improvement Deescalate as able   Height: 5\' 4"  (162.6 cm) Weight: 104.3 kg (229 lb 15 oz) IBW/kg (Calculated) : 59.2  Temp (24hrs), Avg:98 F (36.7 C), Min:97.7 F (36.5 C), Max:98.2 F (36.8 C)  Recent Labs  Lab 04/03/23 0700 04/06/23 1351 04/06/23 1400 04/06/23 1513 04/06/23 1540  WBC 15.2* 21.5* 20.8*  --   --   CREATININE  --   --   --   --  4.87*  LATICACIDVEN  --   --   --  1.7  --     Estimated Creatinine Clearance: 13.7 mL/min (A) (by C-G formula based on SCr of 4.87 mg/dL (H)).    Allergies  Allergen Reactions   Lisinopril Other (See Comments)     Renal impairment   Amlodipine Swelling   Codeine Sulfate Itching and Nausea Only    Antimicrobials this admission: 10/22 vancomycin  >>  10/22 ceftriaxone  >>    Microbiology results: 10/22 BCx: sent 10/22 superficial knee culture: sent   Thank you for allowing pharmacy to be a part of this patient's care.  Stephenie Acres, PharmD PGY1 Pharmacy Resident 04/06/2023 10:13 PM

## 2023-04-06 NOTE — ED Provider Notes (Signed)
  Physical Exam  BP 129/70 (BP Location: Right Arm)   Pulse 72   Temp 98.2 F (36.8 C) (Oral)   Resp 17   Ht 5\' 4"  (1.626 m)   Wt 104.3 kg   SpO2 94%   BMI 39.47 kg/m   Physical Exam  Procedures  Procedures  ED Course / MDM    Medical Decision Making Amount and/or Complexity of Data Reviewed Labs: ordered. Radiology: ordered.  Risk Prescription drug management.   Wardell Honour, have assumed care for this patient.  In brief this is a 77 year old male who is here today for failure to thrive, increasing weakness.  Patient was a signout pending labs and admission.  Looking at the patient's labs, he has a leukocytosis, significantly decreased kidney function.  On exam, he has an abscess on his right knee.  I did perform a small incision and drainage, and got 10 cc of purulent drainage.  Have ordered a plain film.  Have placed a consult for orthopedic surgery.  Patient had this knee replaced in July of this past year.  Spoke with Dr. Odis Hollingshead who recommended admission.        Anders Simmonds T, DO 04/06/23 2118

## 2023-04-06 NOTE — H&P (Signed)
History and Physical      Tanner [name] YQM:[id number] DOB: 03-Aug-1945 DOA: 04/06/2023; DOS: 04/06/2023  [facility] *** Patient coming from: home ***  I have personally briefly reviewed patient's old medical records in [facility]  Chief Complaint: ***  HPI: Tanner [name] is a 77 y.o. male with medical history significant for *** who is admitted to [facility] on 04/06/2023 with *** after presenting from home*** to [facility] ED complaining of ***.    ***       ***   ED Course:  Vital signs in the ED were notable for the following: ***  Labs were notable for the following: ***  Per my interpretation, EKG in ED demonstrated the following:  ***  Imaging in the ED, per corresponding formal radiology read, was notable for the following:  ***  EDP d/w on-call [facility], Dr. Odis Hollingshead, Who conveyed that he will forward this information on to Dr. Charlann Boxer, Who performed the patient's right knee replacement.  Dr. Odis Hollingshead conveys that [facility] will formally consult, see the patient in the morning, and requests that the patient be kept n.p.o. after midnight.  While in the ED, the following were administered: ***  Subsequently, the patient was admitted  ***  ***red    Review of Systems: As per HPI otherwise 10 point review of systems negative.   Past Medical History:  Diagnosis Date   Acute kidney failure (HCC)    Anxiety    Arthritis    Back pain    BPH (benign prostatic hypertrophy)    CHF (congestive heart failure) (HCC)    Clostridium difficile infection    Clotting disorder (HCC)    Depression    Diabetes (HCC) 12/11/2016   type 2    DVT (deep venous thrombosis) (HCC)    DVT of deep femoral vein, right (HCC) 06/29/2018   Edema, lower extremity    High cholesterol    Hypertension    Joint pain    Low back pain potentially associated with spinal stenosis    Neuromuscular disorder (HCC)    Neuropathy of lower extremity     bilateral   OSA (obstructive sleep apnea)    pt denies    Osteoarthritis    PE (pulmonary embolism)    Pneumonia    hx of x 2    Ventral hernia     Past Surgical History:  Procedure Laterality Date   CARDIOVERSION N/A 08/19/2022   Procedure: CARDIOVERSION;  Surgeon: Meriam Sprague, MD;  Location: [facility] ENDOSCOPY;  Service: Cardiovascular;  Laterality: N/A;   EYE SURGERY     HERNIA REPAIR  1999   TOTAL KNEE ARTHROPLASTY Left 08/26/2020   Procedure: LEFT TOTAL KNEE ARTHROPLASTY;  Surgeon: Gean Birchwood, MD;  Location: [facility] ORS;  Service: Orthopedics;  Laterality: Left;   TOTAL KNEE ARTHROPLASTY Right 12/15/2022   Procedure: TOTAL KNEE ARTHROPLASTY;  Surgeon: Durene Romans, MD;  Location: [facility] ORS;  Service: Orthopedics;  Laterality: Right;    Social History:  reports that he quit smoking about 21 years ago. His smoking use included cigarettes. He started smoking about 54 years ago. He has a 66.1 pack-year smoking history. He has never used smokeless tobacco. He reports current alcohol use. He reports that he does not use drugs.   Allergies  Allergen Reactions   Lisinopril Other (See Comments)     Renal impairment   Amlodipine Swelling   Codeine Sulfate Itching and Nausea Only    Family History  Problem Relation Age of Onset   Diabetes Mother    Pneumonia Mother    Alzheimer's disease Mother    Hyperlipidemia Mother    Thyroid disease Mother    Depression Mother    Other Father 69       Drowned on boating accident   Colon cancer Neg Hx    Colon polyps Neg Hx     Family history reviewed and not pertinent ***   Prior to Admission medications   Medication Sig Start Date End Date Taking? Authorizing Provider  acetaminophen (TYLENOL) 500 MG tablet Take 500-1,000 mg by mouth as needed for moderate pain (pain score 4-6). 02/21/13  Yes Norins, Rosalyn Gess, MD  apixaban (ELIQUIS) 5 MG TABS tablet Take 5 mg by mouth 2 (two) times daily.   Yes [provider]  atorvastatin  (LIPITOR) 80 MG tablet Take 1 tablet (80 mg total) by mouth daily. Patient taking differently: Take 80 mg by mouth every evening. 09/17/22 04/06/23 Yes Myrlene Broker, MD  buPROPion [facility] SR) 150 MG 12 hr tablet Take 150 mg by mouth 2 (two) times daily.   Yes [provider]  carvedilol (COREG) 6.25 MG tablet Take 1 tablet (6.25 mg total) by mouth 2 (two) times daily with a meal. 09/17/22 04/06/23 Yes Myrlene Broker, MD  empagliflozin (JARDIANCE) 25 MG TABS tablet Take 12.5 mg by mouth daily.   Yes [provider]  finasteride (PROSCAR) 5 MG tablet Take 5 mg by mouth at bedtime.   Yes [provider]  gabapentin (NEURONTIN) 300 MG capsule Take 600 mg by mouth 3 (three) times daily.   Yes [provider]  loratadine (CLARITIN) 10 MG tablet Take 10 mg by mouth daily.   Yes [provider]  Multiple Vitamins-Minerals (MULTIVITAMINS THER. W/MINERALS) TABS Take 1 tablet by mouth daily.   Yes [provider]  nortriptyline (PAMELOR) 10 MG capsule Take 20 mg by mouth at bedtime.   Yes [provider]  torsemide (DEMADEX) 20 MG tablet TAKE 1 TABLET BY MOUTH EVERY DAY 01/18/23  Yes Myrlene Broker, MD     Objective    Physical Exam: Vitals:   04/06/23 1735 04/06/23 1800 04/06/23 1900 04/06/23 1939  BP: 98/65 (!) 122/53  129/70  Pulse: 71 69 71 72  Resp: 17 19 16 17   Temp:    98.2 F (36.8 C)  TempSrc:    Oral  SpO2: 96% 93% 96% 94%  Weight:      Height:        General: appears to be stated age; alert, oriented Skin: warm, dry, no rash Head:  AT/[place] Mouth:  Oral mucosa membranes appear moist, normal dentition Neck: supple; trachea midline Heart:  RRR; did not appreciate any M/R/G Lungs: CTAB, did not appreciate any wheezes, rales, or rhonchi Abdomen: + BS; soft, ND, NT Vascular: 2+ pedal pulses b/l; 2+ radial pulses b/l Extremities: no peripheral edema, no muscle wasting Neuro: strength and sensation  intact in upper and lower extremities b/l ***   *** Neuro: 5/5 strength of the proximal and distal flexors and extensors of the upper and lower extremities bilaterally; sensation intact in upper and lower extremities b/l; cranial nerves II through XII grossly intact; no pronator drift; no evidence suggestive of slurred speech, dysarthria, or facial droop; Normal muscle tone. No tremors.  *** Neuro: In the setting of the patient's current mental status and associated inability to follow instructions, unable to perform full neurologic exam at this time.  As such, assessment of strength, sensation, and cranial nerves is limited at this time. Patient noted to spontaneously move all 4 extremities. No tremors.  ***    Labs on Admission: I have personally reviewed following labs and imaging studies  CBC: Recent Labs  Lab 04/03/23 0700 04/06/23 1351 04/06/23 1400  WBC 15.2* 21.5* 20.8*  NEUTROABS  --   --  17.1*  HGB 12.4* 10.0* 10.3*  HCT 42.0 33.6* 34.3*  MCV 81.9 79.2* 79.2*  PLT 314 433* 389   Basic Metabolic Panel: Recent Labs  Lab 04/06/23 1540  NA 134*  K 4.2  CL 98  CO2 22  GLUCOSE 91  BUN 57*  CREATININE 4.87*  CALCIUM 8.4*   GFR: Estimated Creatinine Clearance: 13.7 mL/min (A) (by C-G formula based on SCr of 4.87 mg/dL (H)). Liver Function Tests: Recent Labs  Lab 04/06/23 1540  AST 36  ALT 27  ALKPHOS 87  BILITOT 0.5  PROT 5.8*  ALBUMIN 2.5*   Recent Labs  Lab 04/06/23 1540  LIPASE 37   No results for input(s): "AMMONIA" in the last 168 hours. Coagulation Profile: No results for input(s): "INR", "PROTIME" in the last 168 hours. Cardiac Enzymes: Recent Labs  Lab 04/06/23 1540  CKTOTAL 739*   BNP (last 3 results) No results for input(s): "PROBNP" in the last 8760 hours. HbA1C: No results for input(s): "HGBA1C" in the last 72 hours. CBG: No results for input(s): "GLUCAP" in the last 168 hours. Lipid Profile: No results for input(s): "CHOL",  "HDL", "LDLCALC", "TRIG", "CHOLHDL", "LDLDIRECT" in the last 72 hours. Thyroid Function Tests: No results for input(s): "TSH", "T4TOTAL", "FREET4", "T3FREE", "THYROIDAB" in the last 72 hours. Anemia Panel: No results for input(s): "VITAMINB12", "FOLATE", "FERRITIN", "TIBC", "IRON", "RETICCTPCT" in the last 72 hours. Urine analysis:    Component Value Date/Time   COLORURINE YELLOW 04/06/2023 1614   APPEARANCEUR CLEAR 04/06/2023 1614   LABSPEC 1.017 04/06/2023 1614   PHURINE 5.0 04/06/2023 1614   GLUCOSEU >=500 (A) 04/06/2023 1614   GLUCOSEU NEGATIVE 09/13/2007 1028   HGBUR NEGATIVE 04/06/2023 1614   BILIRUBINUR NEGATIVE 04/06/2023 1614   BILIRUBINUR negative 04/19/2015 1417   BILIRUBINUR neg 02/12/2013 1449   KETONESUR NEGATIVE 04/06/2023 1614   PROTEINUR NEGATIVE 04/06/2023 1614   UROBILINOGEN 0.2 04/19/2015 1417   UROBILINOGEN 0.2 mg/dL 16/03/9603 5409   NITRITE NEGATIVE 04/06/2023 1614   LEUKOCYTESUR NEGATIVE 04/06/2023 1614    Radiological Exams on Admission: CT Lumbar Spine Wo Contrast  Result Date: 04/06/2023 CLINICAL DATA:  Fall EXAM: CT LUMBAR SPINE WITHOUT CONTRAST TECHNIQUE: Multidetector CT imaging of the lumbar spine was performed without intravenous contrast administration. Multiplanar CT image reconstructions were also generated. RADIATION DOSE REDUCTION: This exam was performed according to the departmental dose-optimization program which includes automated exposure control, adjustment of the mA and/or kV according to patient size and/or use of iterative reconstruction technique. COMPARISON:  CT abdomen pelvis 12/19/2013 FINDINGS: Segmentation: 5 lumbar type vertebrae. Alignment: Right convex scoliosis with apex at L2. Grade 1 anterolisthesis at L4-5. Vertebrae: Multilevel degenerative endplate remodeling throughout the lumbar spine, but greatest at L1-2 and L2-3. No acute fracture. No evidence of discitis-osteomyelitis. Paraspinal and other soft tissues: Enlarged prostate  gland. Calcific aortic atherosclerosis. There is an incompletely visualized left renal contour abnormality that could be a solid mass. Disc levels: L1-2: Disc bulge and facet arthrosis with mild spinal canal stenosis. Mild left foraminal stenosis. L2-3: Disc bulge and facet arthrosis with moderate spinal canal stenosis. L3-4: Disc bulge and facet arthrosis  with mild spinal canal stenosis. Mild left foraminal stenosis. L4-5: Severe facet arthrosis with disc bulge and anterolisthesis. Severe spinal canal stenosis. Mild right foraminal stenosis. L5-S1: Unremarkable aside from mild facet arthrosis. IMPRESSION: 1. No acute fracture or static subluxation of the lumbar spine. 2. Severe spinal canal stenosis at L4-5 due to combination of disc bulge, facet arthrosis and grade 1 anterolisthesis. 3. Incompletely visualized left renal contour abnormality that could be a solid mass. Recommend further evaluation with renal ultrasound or CT abdomen pelvis with contrast. Aortic Atherosclerosis (ICD10-I70.0). Electronically Signed   By: Deatra Robinson M.D.   On: 04/06/2023 19:22   DG Chest Portable 1 View  Result Date: 04/06/2023 CLINICAL DATA:  Fall. Woke up on floor beside couch. Unknown down time. EXAM: PORTABLE CHEST 1 VIEW COMPARISON:  Chest radiographs 03/25/2023, 03/18/2023, 08/18/2022, 06/17/2022 FINDINGS: Cardiac silhouette and mediastinal contours are within limits. Mild calcification within the aortic arch. There are right-greater-than-left linear basilar densities that appear not significantly changed from 06/17/2022. Similar densities are seen on subsequent radiographs, suggesting chronic scarring. No definite acute airspace opacity. No pleural effusion or pneumothorax. Moderate multilevel degenerative disc changes of the thoracic spine. IMPRESSION: 1. No acute cardiopulmonary process. 2. Right-greater-than-left linear basilar densities that appear not significantly changed from 06/17/2022. Similar densities are  seen on subsequent radiographs, suggesting chronic scarring. Electronically Signed   By: Neita Garnet M.D.   On: 04/06/2023 16:44      Assessment/Plan   Principal Problem:   Cellulitis of right lower extremity   ***            ***                ***                 ***               ***               ***               ***                ***               ***               ***               ***               ***              ***          ***  DVT prophylaxis: SCD's ***  Code Status: Full code*** Family Communication: none*** Disposition Plan: Per Rounding Team Consults called: none***;  Admission status: ***     I SPENT GREATER THAN 75 *** MINUTES IN CLINICAL CARE TIME/MEDICAL DECISION-MAKING IN COMPLETING THIS ADMISSION.      Chaney Born Calee Nugent DO [facility]  From 7PM - 7AM   04/06/2023, 10:00 PM   ***

## 2023-04-06 NOTE — ED Triage Notes (Signed)
Pt arrived via [facility] from home. Pt states he doesn't remember falling. Pt states he woke up on the floor behind the couch. Pt on floor for unknown down time. Pt is on eliquis. Per EMS, pt's back is red. Pt c/o back pain. Per EMS,  has an abscess on his right knee approx the size of a quarter.

## 2023-04-06 NOTE — Telephone Encounter (Signed)
Noted  

## 2023-04-06 NOTE — Consult Note (Signed)
106/53 97.7 F (36.5 C) Oral 66 18 96 % -- --    Radiology: CT Lumbar Spine Wo Contrast  Result Date: 04/06/2023 CLINICAL DATA:  Fall EXAM: CT LUMBAR SPINE WITHOUT CONTRAST TECHNIQUE: Multidetector CT imaging of the lumbar spine was performed without intravenous contrast administration. Multiplanar CT image reconstructions were also generated. RADIATION DOSE REDUCTION: This exam was performed according to the departmental dose-optimization program which includes automated exposure control, adjustment of the mA and/or kV according to patient size and/or use of iterative reconstruction technique. COMPARISON:  CT abdomen pelvis 12/19/2013 FINDINGS: Segmentation: 5 lumbar type vertebrae. Alignment: Right convex scoliosis with apex at L2. Grade 1 anterolisthesis at L4-5. Vertebrae: Multilevel degenerative endplate remodeling throughout the lumbar spine, but greatest at L1-2 and L2-3. No acute fracture. No evidence of discitis-osteomyelitis. Paraspinal and other soft tissues: Enlarged prostate gland. Calcific aortic atherosclerosis. There is an incompletely visualized left renal contour abnormality that could be a solid mass. Disc levels: L1-2: Disc bulge and facet arthrosis with mild spinal canal stenosis. Mild left foraminal stenosis. L2-3: Disc bulge and facet arthrosis with moderate spinal canal stenosis. L3-4: Disc bulge and facet arthrosis with mild spinal canal stenosis. Mild left foraminal stenosis. L4-5: Severe facet arthrosis with disc bulge and anterolisthesis. Severe spinal canal stenosis. Mild right foraminal stenosis. L5-S1: Unremarkable aside from mild facet arthrosis. IMPRESSION: 1. No acute fracture or static subluxation of the lumbar spine. 2. Severe spinal canal stenosis at L4-5 due to combination of disc bulge, facet  arthrosis and grade 1 anterolisthesis. 3. Incompletely visualized left renal contour abnormality that could be a solid mass. Recommend further evaluation with renal ultrasound or CT abdomen pelvis with contrast. Aortic Atherosclerosis (ICD10-I70.0). Electronically Signed   By: Deatra Robinson M.D.   On: 04/06/2023 19:22   DG Chest Portable 1 View  Result Date: 04/06/2023 CLINICAL DATA:  Fall. Woke up on floor beside couch. Unknown down time. EXAM: PORTABLE CHEST 1 VIEW COMPARISON:  Chest radiographs 03/25/2023, 03/18/2023, 08/18/2022, 06/17/2022 FINDINGS: Cardiac silhouette and mediastinal contours are within limits. Mild calcification within the aortic arch. There are right-greater-than-left linear basilar densities that appear not significantly changed from 06/17/2022. Similar densities are seen on subsequent radiographs, suggesting chronic scarring. No definite acute airspace opacity. No pleural effusion or pneumothorax. Moderate multilevel degenerative disc changes of the thoracic spine. IMPRESSION: 1. No acute cardiopulmonary process. 2. Right-greater-than-left linear basilar densities that appear not significantly changed from 06/17/2022. Similar densities are seen on subsequent radiographs, suggesting chronic scarring. Electronically Signed   By: Neita Garnet M.D.   On: 04/06/2023 16:44   DG CHEST PORT 1 VIEW  Result Date: 03/25/2023 CLINICAL DATA:  Leukocytosis EXAM: PORTABLE CHEST 1 VIEW COMPARISON:  CXR 03/18/23 FINDINGS: Pleural effusion. No pneumothorax. Normal cardiac and mediastinal contours. Hazy bibasilar airspace opacities could represent atelectasis infection. Visualized upper abdomen is unremarkable. No radiographically apparent displaced rib fractures. IMPRESSION: Hazy bibasilar airspace opacities could represent atelectasis or infection. Electronically Signed   By: Lorenza Cambridge M.D.   On: 03/25/2023 12:35   DG Swallowing Func-Speech Pathology  Result Date: 03/19/2023 Table  formatting from the original result was not included. Modified Barium Swallow Study Patient Details Name: Tanner [name] MRN: [id number] Date of Birth: 01/25/1946 Today's Date: 03/19/2023 HPI/PMH: HPI: Tanner [name] is a 77 yo male presenting to ED with AMS, productive cough, and body aches. Found to be hypoxic, which was improved with placement of 4L [place]. CXR with bibasilar airspace opacities, R>L, which may be reflective of  [facility];  Service: Orthopedics;  Laterality: Left;  TOTAL KNEE ARTHROPLASTY Right 12/15/2022  Procedure: TOTAL KNEE ARTHROPLASTY;  Surgeon: Durene Romans, MD;  Location: [facility];  Service: Orthopedics;  Laterality: Right; Gwynneth Aliment, M.A., CF-SLP Speech Language Pathology, Acute Rehabilitation Services Secure Chat preferred [phone] 03/19/2023, 12:06 PM  VAS [name] LOWER EXTREMITY VENOUS (DVT)  Result Date: 03/18/2023  Lower Venous DVT Study Patient Name:  Tanner [name]  Date of Exam:   03/18/2023 Medical Rec #: [id number]       Accession #:    [id number] Date of Birth: 08-24-1945       Patient Gender: M Patient Age:   28 years Exam Location:  [facility] Procedure:      VAS [name] LOWER EXTREMITY VENOUS (DVT) Referring Phys: Tanner [name] --------------------------------------------------------------------------------  Indications: Edema. Status post fall on right knee, knee effusion by X-ray. Right TKR 12/15/22  Comparison Study: Prior negative Right LEV done 01/07/22 Performing Technologist: Sherren Kerns RVS  Examination Guidelines: A complete evaluation includes B-mode  imaging, spectral Doppler, color Doppler, and power Doppler as needed of all accessible portions of each vessel. Bilateral testing is considered an integral part of a complete examination. Limited examinations for reoccurring indications may be performed as noted. The reflux portion of the exam is performed with the patient in reverse Trendelenburg.  +--------+---------------+---------+-----------+----------------+-------------+ RIGHT   CompressibilityPhasicitySpontaneityProperties      Thrombus                                                                 Aging         +--------+---------------+---------+-----------+----------------+-------------+ CFV     Full                               pulsatile                                                                waveforms                     +--------+---------------+---------+-----------+----------------+-------------+ SFJ     Full                                                             +--------+---------------+---------+-----------+----------------+-------------+ FV Prox Full                               pulsatile  [facility];  Service: Orthopedics;  Laterality: Left;  TOTAL KNEE ARTHROPLASTY Right 12/15/2022  Procedure: TOTAL KNEE ARTHROPLASTY;  Surgeon: Durene Romans, MD;  Location: [facility];  Service: Orthopedics;  Laterality: Right; Gwynneth Aliment, M.A., CF-SLP Speech Language Pathology, Acute Rehabilitation Services Secure Chat preferred [phone] 03/19/2023, 12:06 PM  VAS [name] LOWER EXTREMITY VENOUS (DVT)  Result Date: 03/18/2023  Lower Venous DVT Study Patient Name:  Tanner [name]  Date of Exam:   03/18/2023 Medical Rec #: [id number]       Accession #:    [id number] Date of Birth: 08-24-1945       Patient Gender: M Patient Age:   28 years Exam Location:  [facility] Procedure:      VAS [name] LOWER EXTREMITY VENOUS (DVT) Referring Phys: Tanner [name] --------------------------------------------------------------------------------  Indications: Edema. Status post fall on right knee, knee effusion by X-ray. Right TKR 12/15/22  Comparison Study: Prior negative Right LEV done 01/07/22 Performing Technologist: Sherren Kerns RVS  Examination Guidelines: A complete evaluation includes B-mode  imaging, spectral Doppler, color Doppler, and power Doppler as needed of all accessible portions of each vessel. Bilateral testing is considered an integral part of a complete examination. Limited examinations for reoccurring indications may be performed as noted. The reflux portion of the exam is performed with the patient in reverse Trendelenburg.  +--------+---------------+---------+-----------+----------------+-------------+ RIGHT   CompressibilityPhasicitySpontaneityProperties      Thrombus                                                                 Aging         +--------+---------------+---------+-----------+----------------+-------------+ CFV     Full                               pulsatile                                                                waveforms                     +--------+---------------+---------+-----------+----------------+-------------+ SFJ     Full                                                             +--------+---------------+---------+-----------+----------------+-------------+ FV Prox Full                               pulsatile  106/53 97.7 F (36.5 C) Oral 66 18 96 % -- --    Radiology: CT Lumbar Spine Wo Contrast  Result Date: 04/06/2023 CLINICAL DATA:  Fall EXAM: CT LUMBAR SPINE WITHOUT CONTRAST TECHNIQUE: Multidetector CT imaging of the lumbar spine was performed without intravenous contrast administration. Multiplanar CT image reconstructions were also generated. RADIATION DOSE REDUCTION: This exam was performed according to the departmental dose-optimization program which includes automated exposure control, adjustment of the mA and/or kV according to patient size and/or use of iterative reconstruction technique. COMPARISON:  CT abdomen pelvis 12/19/2013 FINDINGS: Segmentation: 5 lumbar type vertebrae. Alignment: Right convex scoliosis with apex at L2. Grade 1 anterolisthesis at L4-5. Vertebrae: Multilevel degenerative endplate remodeling throughout the lumbar spine, but greatest at L1-2 and L2-3. No acute fracture. No evidence of discitis-osteomyelitis. Paraspinal and other soft tissues: Enlarged prostate gland. Calcific aortic atherosclerosis. There is an incompletely visualized left renal contour abnormality that could be a solid mass. Disc levels: L1-2: Disc bulge and facet arthrosis with mild spinal canal stenosis. Mild left foraminal stenosis. L2-3: Disc bulge and facet arthrosis with moderate spinal canal stenosis. L3-4: Disc bulge and facet arthrosis with mild spinal canal stenosis. Mild left foraminal stenosis. L4-5: Severe facet arthrosis with disc bulge and anterolisthesis. Severe spinal canal stenosis. Mild right foraminal stenosis. L5-S1: Unremarkable aside from mild facet arthrosis. IMPRESSION: 1. No acute fracture or static subluxation of the lumbar spine. 2. Severe spinal canal stenosis at L4-5 due to combination of disc bulge, facet  arthrosis and grade 1 anterolisthesis. 3. Incompletely visualized left renal contour abnormality that could be a solid mass. Recommend further evaluation with renal ultrasound or CT abdomen pelvis with contrast. Aortic Atherosclerosis (ICD10-I70.0). Electronically Signed   By: Deatra Robinson M.D.   On: 04/06/2023 19:22   DG Chest Portable 1 View  Result Date: 04/06/2023 CLINICAL DATA:  Fall. Woke up on floor beside couch. Unknown down time. EXAM: PORTABLE CHEST 1 VIEW COMPARISON:  Chest radiographs 03/25/2023, 03/18/2023, 08/18/2022, 06/17/2022 FINDINGS: Cardiac silhouette and mediastinal contours are within limits. Mild calcification within the aortic arch. There are right-greater-than-left linear basilar densities that appear not significantly changed from 06/17/2022. Similar densities are seen on subsequent radiographs, suggesting chronic scarring. No definite acute airspace opacity. No pleural effusion or pneumothorax. Moderate multilevel degenerative disc changes of the thoracic spine. IMPRESSION: 1. No acute cardiopulmonary process. 2. Right-greater-than-left linear basilar densities that appear not significantly changed from 06/17/2022. Similar densities are seen on subsequent radiographs, suggesting chronic scarring. Electronically Signed   By: Neita Garnet M.D.   On: 04/06/2023 16:44   DG CHEST PORT 1 VIEW  Result Date: 03/25/2023 CLINICAL DATA:  Leukocytosis EXAM: PORTABLE CHEST 1 VIEW COMPARISON:  CXR 03/18/23 FINDINGS: Pleural effusion. No pneumothorax. Normal cardiac and mediastinal contours. Hazy bibasilar airspace opacities could represent atelectasis infection. Visualized upper abdomen is unremarkable. No radiographically apparent displaced rib fractures. IMPRESSION: Hazy bibasilar airspace opacities could represent atelectasis or infection. Electronically Signed   By: Lorenza Cambridge M.D.   On: 03/25/2023 12:35   DG Swallowing Func-Speech Pathology  Result Date: 03/19/2023 Table  formatting from the original result was not included. Modified Barium Swallow Study Patient Details Name: Tanner [name] MRN: [id number] Date of Birth: 01/25/1946 Today's Date: 03/19/2023 HPI/PMH: HPI: Tanner [name] is a 77 yo male presenting to ED with AMS, productive cough, and body aches. Found to be hypoxic, which was improved with placement of 4L [place]. CXR with bibasilar airspace opacities, R>L, which may be reflective of  [facility];  Service: Orthopedics;  Laterality: Left;  TOTAL KNEE ARTHROPLASTY Right 12/15/2022  Procedure: TOTAL KNEE ARTHROPLASTY;  Surgeon: Durene Romans, MD;  Location: [facility];  Service: Orthopedics;  Laterality: Right; Gwynneth Aliment, M.A., CF-SLP Speech Language Pathology, Acute Rehabilitation Services Secure Chat preferred [phone] 03/19/2023, 12:06 PM  VAS [name] LOWER EXTREMITY VENOUS (DVT)  Result Date: 03/18/2023  Lower Venous DVT Study Patient Name:  Tanner [name]  Date of Exam:   03/18/2023 Medical Rec #: [id number]       Accession #:    [id number] Date of Birth: 08-24-1945       Patient Gender: M Patient Age:   28 years Exam Location:  [facility] Procedure:      VAS [name] LOWER EXTREMITY VENOUS (DVT) Referring Phys: Tanner [name] --------------------------------------------------------------------------------  Indications: Edema. Status post fall on right knee, knee effusion by X-ray. Right TKR 12/15/22  Comparison Study: Prior negative Right LEV done 01/07/22 Performing Technologist: Sherren Kerns RVS  Examination Guidelines: A complete evaluation includes B-mode  imaging, spectral Doppler, color Doppler, and power Doppler as needed of all accessible portions of each vessel. Bilateral testing is considered an integral part of a complete examination. Limited examinations for reoccurring indications may be performed as noted. The reflux portion of the exam is performed with the patient in reverse Trendelenburg.  +--------+---------------+---------+-----------+----------------+-------------+ RIGHT   CompressibilityPhasicitySpontaneityProperties      Thrombus                                                                 Aging         +--------+---------------+---------+-----------+----------------+-------------+ CFV     Full                               pulsatile                                                                waveforms                     +--------+---------------+---------+-----------+----------------+-------------+ SFJ     Full                                                             +--------+---------------+---------+-----------+----------------+-------------+ FV Prox Full                               pulsatile  [facility];  Service: Orthopedics;  Laterality: Left;  TOTAL KNEE ARTHROPLASTY Right 12/15/2022  Procedure: TOTAL KNEE ARTHROPLASTY;  Surgeon: Durene Romans, MD;  Location: [facility];  Service: Orthopedics;  Laterality: Right; Gwynneth Aliment, M.A., CF-SLP Speech Language Pathology, Acute Rehabilitation Services Secure Chat preferred [phone] 03/19/2023, 12:06 PM  VAS [name] LOWER EXTREMITY VENOUS (DVT)  Result Date: 03/18/2023  Lower Venous DVT Study Patient Name:  Tanner [name]  Date of Exam:   03/18/2023 Medical Rec #: [id number]       Accession #:    [id number] Date of Birth: 08-24-1945       Patient Gender: M Patient Age:   28 years Exam Location:  [facility] Procedure:      VAS [name] LOWER EXTREMITY VENOUS (DVT) Referring Phys: Tanner [name] --------------------------------------------------------------------------------  Indications: Edema. Status post fall on right knee, knee effusion by X-ray. Right TKR 12/15/22  Comparison Study: Prior negative Right LEV done 01/07/22 Performing Technologist: Sherren Kerns RVS  Examination Guidelines: A complete evaluation includes B-mode  imaging, spectral Doppler, color Doppler, and power Doppler as needed of all accessible portions of each vessel. Bilateral testing is considered an integral part of a complete examination. Limited examinations for reoccurring indications may be performed as noted. The reflux portion of the exam is performed with the patient in reverse Trendelenburg.  +--------+---------------+---------+-----------+----------------+-------------+ RIGHT   CompressibilityPhasicitySpontaneityProperties      Thrombus                                                                 Aging         +--------+---------------+---------+-----------+----------------+-------------+ CFV     Full                               pulsatile                                                                waveforms                     +--------+---------------+---------+-----------+----------------+-------------+ SFJ     Full                                                             +--------+---------------+---------+-----------+----------------+-------------+ FV Prox Full                               pulsatile  106/53 97.7 F (36.5 C) Oral 66 18 96 % -- --    Radiology: CT Lumbar Spine Wo Contrast  Result Date: 04/06/2023 CLINICAL DATA:  Fall EXAM: CT LUMBAR SPINE WITHOUT CONTRAST TECHNIQUE: Multidetector CT imaging of the lumbar spine was performed without intravenous contrast administration. Multiplanar CT image reconstructions were also generated. RADIATION DOSE REDUCTION: This exam was performed according to the departmental dose-optimization program which includes automated exposure control, adjustment of the mA and/or kV according to patient size and/or use of iterative reconstruction technique. COMPARISON:  CT abdomen pelvis 12/19/2013 FINDINGS: Segmentation: 5 lumbar type vertebrae. Alignment: Right convex scoliosis with apex at L2. Grade 1 anterolisthesis at L4-5. Vertebrae: Multilevel degenerative endplate remodeling throughout the lumbar spine, but greatest at L1-2 and L2-3. No acute fracture. No evidence of discitis-osteomyelitis. Paraspinal and other soft tissues: Enlarged prostate gland. Calcific aortic atherosclerosis. There is an incompletely visualized left renal contour abnormality that could be a solid mass. Disc levels: L1-2: Disc bulge and facet arthrosis with mild spinal canal stenosis. Mild left foraminal stenosis. L2-3: Disc bulge and facet arthrosis with moderate spinal canal stenosis. L3-4: Disc bulge and facet arthrosis with mild spinal canal stenosis. Mild left foraminal stenosis. L4-5: Severe facet arthrosis with disc bulge and anterolisthesis. Severe spinal canal stenosis. Mild right foraminal stenosis. L5-S1: Unremarkable aside from mild facet arthrosis. IMPRESSION: 1. No acute fracture or static subluxation of the lumbar spine. 2. Severe spinal canal stenosis at L4-5 due to combination of disc bulge, facet  arthrosis and grade 1 anterolisthesis. 3. Incompletely visualized left renal contour abnormality that could be a solid mass. Recommend further evaluation with renal ultrasound or CT abdomen pelvis with contrast. Aortic Atherosclerosis (ICD10-I70.0). Electronically Signed   By: Deatra Robinson M.D.   On: 04/06/2023 19:22   DG Chest Portable 1 View  Result Date: 04/06/2023 CLINICAL DATA:  Fall. Woke up on floor beside couch. Unknown down time. EXAM: PORTABLE CHEST 1 VIEW COMPARISON:  Chest radiographs 03/25/2023, 03/18/2023, 08/18/2022, 06/17/2022 FINDINGS: Cardiac silhouette and mediastinal contours are within limits. Mild calcification within the aortic arch. There are right-greater-than-left linear basilar densities that appear not significantly changed from 06/17/2022. Similar densities are seen on subsequent radiographs, suggesting chronic scarring. No definite acute airspace opacity. No pleural effusion or pneumothorax. Moderate multilevel degenerative disc changes of the thoracic spine. IMPRESSION: 1. No acute cardiopulmonary process. 2. Right-greater-than-left linear basilar densities that appear not significantly changed from 06/17/2022. Similar densities are seen on subsequent radiographs, suggesting chronic scarring. Electronically Signed   By: Neita Garnet M.D.   On: 04/06/2023 16:44   DG CHEST PORT 1 VIEW  Result Date: 03/25/2023 CLINICAL DATA:  Leukocytosis EXAM: PORTABLE CHEST 1 VIEW COMPARISON:  CXR 03/18/23 FINDINGS: Pleural effusion. No pneumothorax. Normal cardiac and mediastinal contours. Hazy bibasilar airspace opacities could represent atelectasis infection. Visualized upper abdomen is unremarkable. No radiographically apparent displaced rib fractures. IMPRESSION: Hazy bibasilar airspace opacities could represent atelectasis or infection. Electronically Signed   By: Lorenza Cambridge M.D.   On: 03/25/2023 12:35   DG Swallowing Func-Speech Pathology  Result Date: 03/19/2023 Table  formatting from the original result was not included. Modified Barium Swallow Study Patient Details Name: Tanner [name] MRN: [id number] Date of Birth: 01/25/1946 Today's Date: 03/19/2023 HPI/PMH: HPI: Tanner [name] is a 77 yo male presenting to ED with AMS, productive cough, and body aches. Found to be hypoxic, which was improved with placement of 4L [place]. CXR with bibasilar airspace opacities, R>L, which may be reflective of  Patient ID: ISAH CATBAGAN MRN: [id number] DOB/AGE: 19-Jun-1945 77 y.o.  Admit date: 04/06/2023  Admission Diagnoses:  Principal Problem:   Cellulitis of right lower extremity   HPI: Ortho consult for right knee abscess in setting of TKA (12/15/22 - Dr. Charlann Boxer). He has had recurrent falls and found down on multiple occasions including today by his son. The patient tells me that he was doing well from his knee but has started draining for past 2 days.   PMH notable for diabetes, DVT on anticoagulation, paroxysmal A-fib, prior PE, relatively recent right knee replacement, recent admission for pneumonia.  Denies numbness/tingling. Denies fever/chills.  Past Medical History: Past Medical History:  Diagnosis Date   Acute kidney failure (HCC)    Anxiety    Arthritis    Back pain    BPH (benign prostatic hypertrophy)    CHF (congestive heart failure) (HCC)    Clostridium difficile infection    Clotting disorder (HCC)    Depression    Diabetes (HCC) 12/11/2016   type 2    DVT (deep venous thrombosis) (HCC)    DVT of deep femoral vein, right (HCC) 06/29/2018   Edema, lower extremity    High cholesterol    Hypertension    Joint pain    Low back pain potentially associated with spinal stenosis    Neuromuscular disorder (HCC)    Neuropathy of lower extremity    bilateral   OSA (obstructive sleep apnea)    pt denies    Osteoarthritis    PE (pulmonary embolism)    Pneumonia    hx of x 2    Ventral hernia     Surgical History: Past Surgical History:  Procedure Laterality Date   CARDIOVERSION N/A 08/19/2022   Procedure: CARDIOVERSION;  Surgeon: Meriam Sprague, MD;  Location: [facility] ENDOSCOPY;  Service: Cardiovascular;  Laterality: N/A;   EYE SURGERY     HERNIA REPAIR  1999   TOTAL KNEE ARTHROPLASTY Left 08/26/2020   Procedure: LEFT TOTAL KNEE ARTHROPLASTY;  Surgeon: Gean Birchwood, MD;  Location: [facility];  Service: Orthopedics;  Laterality: Left;   TOTAL KNEE ARTHROPLASTY  Right 12/15/2022   Procedure: TOTAL KNEE ARTHROPLASTY;  Surgeon: Durene Romans, MD;  Location: [facility];  Service: Orthopedics;  Laterality: Right;    Family History: Family History  Problem Relation Age of Onset   Diabetes Mother    Pneumonia Mother    Alzheimer's disease Mother    Hyperlipidemia Mother    Thyroid disease Mother    Depression Mother    Other Father 63       Drowned on boating accident   Colon cancer Neg Hx    Colon polyps Neg Hx     Social History: Social History   Socioeconomic History   Marital status: Widowed    Spouse name: Not on file   Number of children: 2   Years of education: Not on file   Highest education level: Not on file  Occupational History   Occupation: Retired  Tobacco Use   Smoking status: Former    Current packs/day: 0.00    Average packs/day: 2.0 packs/day for 33.0 years (66.1 ttl pk-yrs)    Types: Cigarettes    Start date: 08/30/1968    Quit date: 07/02/2001    Years since quitting: 21.7   Smokeless tobacco: Never   Tobacco comments:    quit 12 years ago  Vaping Use   Vaping status: Never Used  Substance and Sexual Activity  [facility];  Service: Orthopedics;  Laterality: Left;  TOTAL KNEE ARTHROPLASTY Right 12/15/2022  Procedure: TOTAL KNEE ARTHROPLASTY;  Surgeon: Durene Romans, MD;  Location: [facility];  Service: Orthopedics;  Laterality: Right; Gwynneth Aliment, M.A., CF-SLP Speech Language Pathology, Acute Rehabilitation Services Secure Chat preferred [phone] 03/19/2023, 12:06 PM  VAS [name] LOWER EXTREMITY VENOUS (DVT)  Result Date: 03/18/2023  Lower Venous DVT Study Patient Name:  Tanner [name]  Date of Exam:   03/18/2023 Medical Rec #: [id number]       Accession #:    [id number] Date of Birth: 08-24-1945       Patient Gender: M Patient Age:   28 years Exam Location:  [facility] Procedure:      VAS [name] LOWER EXTREMITY VENOUS (DVT) Referring Phys: Tanner [name] --------------------------------------------------------------------------------  Indications: Edema. Status post fall on right knee, knee effusion by X-ray. Right TKR 12/15/22  Comparison Study: Prior negative Right LEV done 01/07/22 Performing Technologist: Sherren Kerns RVS  Examination Guidelines: A complete evaluation includes B-mode  imaging, spectral Doppler, color Doppler, and power Doppler as needed of all accessible portions of each vessel. Bilateral testing is considered an integral part of a complete examination. Limited examinations for reoccurring indications may be performed as noted. The reflux portion of the exam is performed with the patient in reverse Trendelenburg.  +--------+---------------+---------+-----------+----------------+-------------+ RIGHT   CompressibilityPhasicitySpontaneityProperties      Thrombus                                                                 Aging         +--------+---------------+---------+-----------+----------------+-------------+ CFV     Full                               pulsatile                                                                waveforms                     +--------+---------------+---------+-----------+----------------+-------------+ SFJ     Full                                                             +--------+---------------+---------+-----------+----------------+-------------+ FV Prox Full                               pulsatile

## 2023-04-06 NOTE — Telephone Encounter (Signed)
Melissa with [facility] called states she has sent patient back to hospital via ambulance, he will be at [facility]. States patient fell sometime last night and was in the floor for about 13 hours. Son found him and got him up before she arrived. States he was shaky, and patient is not safe to stay at home by himself, states she informed patient's son ( also is patient POA). Best callback is [phone].

## 2023-04-06 NOTE — ED Notes (Signed)
Patient transported to CT 

## 2023-04-06 NOTE — ED Notes (Addendum)
Pt has stage 2 excoriated buttocks bilat. Pt has a rash on entire buttocks and some on lower back. We placed a mepilex on pt buttocks

## 2023-04-06 NOTE — Progress Notes (Signed)
ED Pharmacy Antibiotic Sign Off An antibiotic consult was received from an ED provider for vancomycin per pharmacy dosing for cellulitis. A chart review was completed to assess appropriateness.   The following one time order(s) were placed per pharmacy consult:   vancomycin 2000 mg IV x 1 dose  Further antibiotic and/or antibiotic pharmacy consults should be ordered by the admitting provider if indicated.   Thank you for allowing pharmacy to be a part of this patient's care.   Stephenie Acres, PharmD PGY1 Pharmacy Resident 04/06/2023 4:05 PM

## 2023-04-06 NOTE — Nursing Note (Signed)
POPULATION HEALTH    DISEASE MANAGEMENT COORDINATOR    [facility] made second call to patient for monthly follow up. Patient did not answer. [facility] left voicemail with call back information. [facility] will make one more attempt prior to month end.         Alden Benjamin, RN, BSN  Disease Management Coordinator  [employer]  [phone]

## 2023-04-07 ENCOUNTER — Other Ambulatory Visit: Payer: Self-pay

## 2023-04-07 ENCOUNTER — Inpatient Hospital Stay (HOSPITAL_COMMUNITY): Payer: No Typology Code available for payment source

## 2023-04-07 ENCOUNTER — Inpatient Hospital Stay (HOSPITAL_COMMUNITY): Payer: No Typology Code available for payment source | Admitting: Anesthesiology

## 2023-04-07 ENCOUNTER — Other Ambulatory Visit (HOSPITAL_COMMUNITY): Payer: Medicare HMO

## 2023-04-07 ENCOUNTER — Other Ambulatory Visit (INDEPENDENT_AMBULATORY_CARE_PROVIDER_SITE_OTHER): Payer: Self-pay

## 2023-04-07 ENCOUNTER — Other Ambulatory Visit (INDEPENDENT_AMBULATORY_CARE_PROVIDER_SITE_OTHER): Payer: Self-pay | Admitting: Family Medicine

## 2023-04-07 ENCOUNTER — Encounter (HOSPITAL_COMMUNITY)
Admission: EM | Disposition: A | Discharge status: Skilled Nursing Facility | Payer: Self-pay | Source: Home / Self Care | Attending: Internal Medicine

## 2023-04-07 ENCOUNTER — Encounter (HOSPITAL_COMMUNITY): Payer: Self-pay | Admitting: Internal Medicine

## 2023-04-07 DIAGNOSIS — B952 Enterococcus as the cause of diseases classified elsewhere: Secondary | ICD-10-CM | POA: Insufficient documentation

## 2023-04-07 DIAGNOSIS — T8453XA Infection and inflammatory reaction due to internal right knee prosthesis, initial encounter: Secondary | ICD-10-CM

## 2023-04-07 DIAGNOSIS — N170 Acute kidney failure with tubular necrosis: Secondary | ICD-10-CM | POA: Diagnosis not present

## 2023-04-07 DIAGNOSIS — E1122 Type 2 diabetes mellitus with diabetic chronic kidney disease: Secondary | ICD-10-CM | POA: Diagnosis not present

## 2023-04-07 DIAGNOSIS — N189 Chronic kidney disease, unspecified: Secondary | ICD-10-CM

## 2023-04-07 DIAGNOSIS — W19XXXA Unspecified fall, initial encounter: Secondary | ICD-10-CM

## 2023-04-07 DIAGNOSIS — R7881 Bacteremia: Secondary | ICD-10-CM

## 2023-04-07 DIAGNOSIS — R531 Weakness: Secondary | ICD-10-CM

## 2023-04-07 DIAGNOSIS — I480 Paroxysmal atrial fibrillation: Secondary | ICD-10-CM | POA: Diagnosis not present

## 2023-04-07 DIAGNOSIS — B9562 Methicillin resistant Staphylococcus aureus infection as the cause of diseases classified elsewhere: Secondary | ICD-10-CM

## 2023-04-07 DIAGNOSIS — E785 Hyperlipidemia, unspecified: Secondary | ICD-10-CM | POA: Diagnosis present

## 2023-04-07 DIAGNOSIS — L02419 Cutaneous abscess of limb, unspecified: Secondary | ICD-10-CM

## 2023-04-07 DIAGNOSIS — I509 Heart failure, unspecified: Secondary | ICD-10-CM | POA: Diagnosis not present

## 2023-04-07 DIAGNOSIS — D649 Anemia, unspecified: Secondary | ICD-10-CM | POA: Diagnosis present

## 2023-04-07 DIAGNOSIS — I130 Hypertensive heart and chronic kidney disease with heart failure and stage 1 through stage 4 chronic kidney disease, or unspecified chronic kidney disease: Secondary | ICD-10-CM

## 2023-04-07 DIAGNOSIS — B955 Unspecified streptococcus as the cause of diseases classified elsewhere: Secondary | ICD-10-CM | POA: Insufficient documentation

## 2023-04-07 DIAGNOSIS — Z7189 Other specified counseling: Secondary | ICD-10-CM

## 2023-04-07 DIAGNOSIS — F338 Other recurrent depressive disorders: Secondary | ICD-10-CM

## 2023-04-07 HISTORY — PX: I & D KNEE WITH POLY EXCHANGE: SHX5024

## 2023-04-07 LAB — CBC WITH DIFFERENTIAL/PLATELET
Abs Immature Granulocytes: 0.1 10*3/uL — ABNORMAL HIGH (ref 0.00–0.07)
Basophils Absolute: 0.1 10*3/uL (ref 0.0–0.1)
Basophils Relative: 0 %
Eosinophils Absolute: 0.1 10*3/uL (ref 0.0–0.5)
Eosinophils Relative: 1 %
HCT: 28.9 % — ABNORMAL LOW (ref 39.0–52.0)
Hemoglobin: 8.6 g/dL — ABNORMAL LOW (ref 13.0–17.0)
Immature Granulocytes: 1 %
Lymphocytes Relative: 8 %
Lymphs Abs: 1.2 10*3/uL (ref 0.7–4.0)
MCH: 23.8 pg — ABNORMAL LOW (ref 26.0–34.0)
MCHC: 29.8 g/dL — ABNORMAL LOW (ref 30.0–36.0)
MCV: 79.8 fL — ABNORMAL LOW (ref 80.0–100.0)
Monocytes Absolute: 0.8 10*3/uL (ref 0.1–1.0)
Monocytes Relative: 5 %
Neutro Abs: 12.2 10*3/uL — ABNORMAL HIGH (ref 1.7–7.7)
Neutrophils Relative %: 85 %
Platelets: 306 10*3/uL (ref 150–400)
RBC: 3.62 MIL/uL — ABNORMAL LOW (ref 4.22–5.81)
RDW: 17.4 % — ABNORMAL HIGH (ref 11.5–15.5)
WBC: 14.4 10*3/uL — ABNORMAL HIGH (ref 4.0–10.5)
nRBC: 0 % (ref 0.0–0.2)

## 2023-04-07 LAB — IRON AND TIBC
Iron: 10 ug/dL — ABNORMAL LOW (ref 45–182)
Saturation Ratios: 5 % — ABNORMAL LOW (ref 17.9–39.5)
TIBC: 206 ug/dL — ABNORMAL LOW (ref 250–450)
UIBC: 196 ug/dL

## 2023-04-07 LAB — ECHOCARDIOGRAM COMPLETE
AR max vel: 2.61 cm2
AV Area VTI: 2.44 cm2
AV Area mean vel: 2.46 cm2
AV Mean grad: 5 mm[Hg]
AV Peak grad: 9.9 mm[Hg]
Ao pk vel: 1.57 m/s
Area-P 1/2: 3.77 cm2
Calc EF: 83 %
Height: 64 in
S' Lateral: 3.4 cm
Single Plane A2C EF: 84.6 %
Single Plane A4C EF: 81.1 %
Weight: 3555.58 [oz_av]

## 2023-04-07 LAB — BLOOD CULTURE ID PANEL (REFLEXED) - BCID2
A.calcoaceticus-baumannii: NOT DETECTED
Bacteroides fragilis: NOT DETECTED
Candida albicans: NOT DETECTED
Candida auris: NOT DETECTED
Candida glabrata: NOT DETECTED
Candida krusei: NOT DETECTED
Candida parapsilosis: NOT DETECTED
Candida tropicalis: NOT DETECTED
Cryptococcus neoformans/gattii: NOT DETECTED
Enterobacter cloacae complex: NOT DETECTED
Enterobacterales: NOT DETECTED
Enterococcus Faecium: NOT DETECTED
Enterococcus faecalis: DETECTED — AB
Escherichia coli: NOT DETECTED
Haemophilus influenzae: NOT DETECTED
Klebsiella aerogenes: NOT DETECTED
Klebsiella oxytoca: NOT DETECTED
Klebsiella pneumoniae: NOT DETECTED
Listeria monocytogenes: NOT DETECTED
Meth resistant mecA/C and MREJ: DETECTED — AB
Neisseria meningitidis: NOT DETECTED
Proteus species: NOT DETECTED
Pseudomonas aeruginosa: NOT DETECTED
Salmonella species: NOT DETECTED
Serratia marcescens: NOT DETECTED
Staphylococcus aureus (BCID): DETECTED — AB
Staphylococcus epidermidis: NOT DETECTED
Staphylococcus lugdunensis: NOT DETECTED
Staphylococcus species: DETECTED — AB
Stenotrophomonas maltophilia: NOT DETECTED
Streptococcus agalactiae: NOT DETECTED
Streptococcus pneumoniae: NOT DETECTED
Streptococcus pyogenes: NOT DETECTED
Streptococcus species: DETECTED — AB
Vancomycin resistance: NOT DETECTED

## 2023-04-07 LAB — COMPREHENSIVE METABOLIC PANEL
ALT: 25 U/L (ref 0–44)
AST: 31 U/L (ref 15–41)
Albumin: 2.1 g/dL — ABNORMAL LOW (ref 3.5–5.0)
Alkaline Phosphatase: 80 U/L (ref 38–126)
Anion gap: 14 (ref 5–15)
BUN: 54 mg/dL — ABNORMAL HIGH (ref 8–23)
CO2: 21 mmol/L — ABNORMAL LOW (ref 22–32)
Calcium: 8.1 mg/dL — ABNORMAL LOW (ref 8.9–10.3)
Chloride: 100 mmol/L (ref 98–111)
Creatinine, Ser: 4.64 mg/dL — ABNORMAL HIGH (ref 0.61–1.24)
GFR, Estimated: 12 mL/min — ABNORMAL LOW (ref >60)
Glucose, Bld: 89 mg/dL (ref 70–99)
Potassium: 3.9 mmol/L (ref 3.5–5.1)
Sodium: 135 mmol/L (ref 135–145)
Total Bilirubin: 0.5 mg/dL (ref 0.3–1.2)
Total Protein: 5 g/dL — ABNORMAL LOW (ref 6.5–8.1)

## 2023-04-07 LAB — GLUCOSE, CAPILLARY
Glucose-Capillary: 59 mg/dL — ABNORMAL LOW (ref 70–99)
Glucose-Capillary: 88 mg/dL (ref 70–99)
Glucose-Capillary: 111 mg/dL — ABNORMAL HIGH (ref 70–99)
Glucose-Capillary: 72 mg/dL (ref 70–99)
Glucose-Capillary: 48 mg/dL — ABNORMAL LOW (ref 70–99)
Glucose-Capillary: 140 mg/dL — ABNORMAL HIGH (ref 70–99)
Glucose-Capillary: 102 mg/dL — ABNORMAL HIGH (ref 70–99)
Glucose-Capillary: 156 mg/dL — ABNORMAL HIGH (ref 70–99)

## 2023-04-07 LAB — FOLATE: Folate: 16.5 ng/mL (ref >5.9)

## 2023-04-07 LAB — VITAMIN B12: Vitamin B-12: 881 pg/mL (ref 180–914)

## 2023-04-07 LAB — TSH: TSH: 2.721 u[IU]/mL (ref 0.350–4.500)

## 2023-04-07 LAB — C-REACTIVE PROTEIN: CRP: 13.4 mg/dL — ABNORMAL HIGH (ref <1.0)

## 2023-04-07 LAB — MAGNESIUM: Magnesium: 2.3 mg/dL (ref 1.7–2.4)

## 2023-04-07 LAB — SEDIMENTATION RATE: Sed Rate: 58 mm/h — ABNORMAL HIGH (ref 0–16)

## 2023-04-07 LAB — FERRITIN: Ferritin: 197 ng/mL (ref 24–336)

## 2023-04-07 SURGERY — IRRIGATION AND DEBRIDEMENT KNEE WITH POLY EXCHANGE
Anesthesia: General | Site: Knee | Laterality: Right

## 2023-04-07 MED ORDER — TRANEXAMIC ACID-NACL 1000-0.7 MG/100ML-% IV SOLN
1000.0000 mg | INTRAVENOUS | Status: AC
  Administered 2023-04-07: 1000 mg via INTRAVENOUS
  Filled 2023-04-07: qty 100

## 2023-04-07 MED ORDER — DAPTOMYCIN-SODIUM CHLORIDE 700-0.9 MG/100ML-% IV SOLN
700.0000 mg | INTRAVENOUS | Status: DC
  Filled 2023-04-07: qty 100

## 2023-04-07 MED ORDER — ORAL CARE MOUTH RINSE: 15.0000 mL | Freq: Once | OROMUCOSAL | Status: AC

## 2023-04-07 MED ORDER — CHLORHEXIDINE GLUCONATE 0.12 % MT SOLN
OROMUCOSAL | Status: AC
  Administered 2023-04-07: 15 mL via OROMUCOSAL
  Filled 2023-04-07: qty 15

## 2023-04-07 MED ORDER — ACETAMINOPHEN 500 MG PO TABS
1000.0000 mg | ORAL_TABLET | Freq: Once | ORAL | Status: AC
  Administered 2023-04-07: 1000 mg via ORAL
  Filled 2023-04-07: qty 2

## 2023-04-07 MED ORDER — DEXTROSE 50 % IV SOLN
INTRAVENOUS | Status: AC
  Filled 2023-04-07: qty 50

## 2023-04-07 MED ORDER — ONDANSETRON HCL 4 MG/2ML IJ SOLN
INTRAMUSCULAR | Status: DC | PRN
  Administered 2023-04-07: 4 mg via INTRAVENOUS

## 2023-04-07 MED ORDER — VANCOMYCIN HCL 1000 MG IV SOLR
INTRAVENOUS | Status: AC
  Filled 2023-04-07: qty 20

## 2023-04-07 MED ORDER — SODIUM CHLORIDE 0.9 % IR SOLN
Status: DC | PRN
  Administered 2023-04-07: 3000 mL
  Administered 2023-04-07: 1000 mL

## 2023-04-07 MED ORDER — DEXTROSE 50 % IV SOLN
12.5000 g | INTRAVENOUS | Status: AC
Start: 2023-04-07 — End: 2023-04-07
  Administered 2023-04-07: 12.5 g via INTRAVENOUS
  Filled 2023-04-07: qty 50

## 2023-04-07 MED ORDER — FENTANYL CITRATE (PF) 250 MCG/5ML IJ SOLN
INTRAMUSCULAR | Status: DC | PRN
  Administered 2023-04-07: 50 ug via INTRAVENOUS

## 2023-04-07 MED ORDER — INSULIN ASPART 100 UNIT/ML IJ SOLN
0.0000 [IU] | Freq: Four times a day (QID) | INTRAMUSCULAR | Status: DC
  Administered 2023-04-07 – 2023-04-08 (×2): 1 [IU] via SUBCUTANEOUS

## 2023-04-07 MED ORDER — PROPOFOL 10 MG/ML IV BOLUS
INTRAVENOUS | Status: DC | PRN
  Administered 2023-04-07: 80 mg via INTRAVENOUS

## 2023-04-07 MED ORDER — ROCURONIUM BROMIDE 10 MG/ML (PF) SYRINGE
PREFILLED_SYRINGE | INTRAVENOUS | Status: DC | PRN
  Administered 2023-04-07: 60 mg via INTRAVENOUS

## 2023-04-07 MED ORDER — LIDOCAINE 2% (20 MG/ML) 5 ML SYRINGE
INTRAMUSCULAR | Status: DC | PRN
  Administered 2023-04-07: 100 mg via INTRAVENOUS

## 2023-04-07 MED ORDER — FENTANYL CITRATE (PF) 250 MCG/5ML IJ SOLN
INTRAMUSCULAR | Status: AC
  Filled 2023-04-07: qty 5

## 2023-04-07 MED ORDER — SUGAMMADEX SODIUM 200 MG/2ML IV SOLN
INTRAVENOUS | Status: DC | PRN
  Administered 2023-04-07: 200 mg via INTRAVENOUS

## 2023-04-07 MED ORDER — INSULIN ASPART 100 UNIT/ML IJ SOLN: 0.0000 [IU] | Freq: Three times a day (TID) | INTRAMUSCULAR | Status: DC

## 2023-04-07 MED ORDER — PROPOFOL 10 MG/ML IV BOLUS
INTRAVENOUS | Status: AC
  Filled 2023-04-07: qty 20

## 2023-04-07 MED ORDER — CHLORHEXIDINE GLUCONATE 4 % EX SOLN: 60.0000 mL | Freq: Once | CUTANEOUS | Status: DC

## 2023-04-07 MED ORDER — DROPERIDOL 2.5 MG/ML IJ SOLN: 0.6250 mg | Freq: Once | INTRAMUSCULAR | Status: DC | PRN

## 2023-04-07 MED ORDER — PHENYLEPHRINE 80 MCG/ML (10ML) SYRINGE FOR IV PUSH (FOR BLOOD PRESSURE SUPPORT)
PREFILLED_SYRINGE | INTRAVENOUS | Status: DC | PRN
  Administered 2023-04-07: 80 ug via INTRAVENOUS
  Administered 2023-04-07: 240 ug via INTRAVENOUS
  Administered 2023-04-07 (×2): 160 ug via INTRAVENOUS
  Administered 2023-04-07: 240 ug via INTRAVENOUS

## 2023-04-07 MED ORDER — MIDAZOLAM HCL 2 MG/2ML IJ SOLN
INTRAMUSCULAR | Status: DC | PRN
  Administered 2023-04-07: 1 mg via INTRAVENOUS

## 2023-04-07 MED ORDER — OXYCODONE HCL 5 MG/5ML PO SOLN: 5.0000 mg | Freq: Once | ORAL | Status: DC | PRN

## 2023-04-07 MED ORDER — CEFAZOLIN SODIUM-DEXTROSE 2-4 GM/100ML-% IV SOLN
2.0000 g | INTRAVENOUS | Status: AC
  Administered 2023-04-07: 2 g via INTRAVENOUS
  Filled 2023-04-07: qty 100

## 2023-04-07 MED ORDER — SODIUM CHLORIDE 0.9 % IV SOLN: INTRAVENOUS | Status: DC

## 2023-04-07 MED ORDER — PERFLUTREN LIPID MICROSPHERE
1.0000 mL | INTRAVENOUS | Status: AC | PRN
  Administered 2023-04-07: 2 mL via INTRAVENOUS

## 2023-04-07 MED ORDER — CEFTRIAXONE SODIUM 2 G IJ SOLR
2.0000 g | INTRAMUSCULAR | Status: DC
  Administered 2023-04-08: 2 g via INTRAVENOUS
  Filled 2023-04-07: qty 20

## 2023-04-07 MED ORDER — OXYCODONE HCL 5 MG PO TABS: 5.0000 mg | ORAL_TABLET | Freq: Once | ORAL | Status: DC | PRN

## 2023-04-07 MED ORDER — VANCOMYCIN HCL 1000 MG IV SOLR
INTRAVENOUS | Status: DC | PRN
  Administered 2023-04-07: 1000 mg

## 2023-04-07 MED ORDER — POVIDONE-IODINE 10 % EX SWAB
2.0000 | Freq: Once | CUTANEOUS | Status: AC
  Administered 2023-04-07: 2 via TOPICAL

## 2023-04-07 MED ORDER — PHENYLEPHRINE 80 MCG/ML (10ML) SYRINGE FOR IV PUSH (FOR BLOOD PRESSURE SUPPORT)
PREFILLED_SYRINGE | INTRAVENOUS | Status: AC
  Filled 2023-04-07: qty 20

## 2023-04-07 MED ORDER — MIDAZOLAM HCL 2 MG/2ML IJ SOLN
INTRAMUSCULAR | Status: AC
  Filled 2023-04-07: qty 2

## 2023-04-07 MED ORDER — CHLORHEXIDINE GLUCONATE 0.12 % MT SOLN: 15.0000 mL | Freq: Once | OROMUCOSAL | Status: AC

## 2023-04-07 MED ORDER — BUPIVACAINE-EPINEPHRINE (PF) 0.5% -1:200000 IJ SOLN
INTRAMUSCULAR | Status: AC
  Filled 2023-04-07: qty 30

## 2023-04-07 MED ORDER — DEXAMETHASONE SODIUM PHOSPHATE 10 MG/ML IJ SOLN
INTRAMUSCULAR | Status: DC | PRN
  Administered 2023-04-07: 10 mg via INTRAVENOUS

## 2023-04-07 MED ORDER — FENTANYL CITRATE (PF) 100 MCG/2ML IJ SOLN: 25.0000 ug | INTRAMUSCULAR | Status: DC | PRN

## 2023-04-07 MED ORDER — DEXTROSE 50 % IV SOLN
25.0000 g | INTRAVENOUS | Status: AC
  Administered 2023-04-07: 25 g via INTRAVENOUS

## 2023-04-07 SURGICAL SUPPLY — 74 items
ADH SKN CLS APL DERMABOND .7 (GAUZE/BANDAGES/DRESSINGS) ×2
BAG COUNTER SPONGE SURGICOUNT (BAG) ×1 IMPLANT
BAG SPNG CNTER NS LX DISP (BAG) ×1
BANDAGE ESMARK 6X9 LF (GAUZE/BANDAGES/DRESSINGS) ×1 IMPLANT
BNDG CMPR 9X6 STRL LF SNTH (GAUZE/BANDAGES/DRESSINGS) ×1
BNDG CMPR MED 10X6 ELC LF (GAUZE/BANDAGES/DRESSINGS) ×1
BNDG ELASTIC 6X10 VLCR STRL LF (GAUZE/BANDAGES/DRESSINGS) IMPLANT
BNDG ELASTIC 6X5.8 VLCR STR LF (GAUZE/BANDAGES/DRESSINGS) ×1 IMPLANT
BNDG ESMARK 6X9 LF (GAUZE/BANDAGES/DRESSINGS) ×1
BOWL SMART MIX CTS (DISPOSABLE) ×1 IMPLANT
COVER SURGICAL LIGHT HANDLE (MISCELLANEOUS) ×1 IMPLANT
CUFF TOURN SGL QUICK 34 (TOURNIQUET CUFF)
CUFF TOURN SGL QUICK 42 (TOURNIQUET CUFF) IMPLANT
CUFF TRNQT CYL 34X4.125X (TOURNIQUET CUFF) IMPLANT
DERMABOND ADVANCED .7 DNX12 (GAUZE/BANDAGES/DRESSINGS) IMPLANT
DRAPE HALF SHEET 40X57 (DRAPES) ×1 IMPLANT
DRAPE IMP U-DRAPE 54X76 (DRAPES) ×1 IMPLANT
DRAPE POUCH INSTRU U-SHP 10X18 (DRAPES) ×1 IMPLANT
DRAPE U-SHAPE 47X51 STRL (DRAPES) ×1 IMPLANT
DRSG ADAPTIC 3X8 NADH LF (GAUZE/BANDAGES/DRESSINGS) ×1 IMPLANT
DRSG AQUACEL AG ADV 3.5X14 (GAUZE/BANDAGES/DRESSINGS) IMPLANT
ELECT REM PT RETURN 9FT ADLT (ELECTROSURGICAL) ×1
ELECTRODE REM PT RTRN 9FT ADLT (ELECTROSURGICAL) ×1 IMPLANT
FACESHIELD STD STERILE (MASK) ×2 IMPLANT
FACESHIELD WRAPAROUND (MASK) ×1 IMPLANT
FACESHIELD WRAPAROUND OR TEAM (MASK) ×1 IMPLANT
GAUZE PAD ABD 8X10 STRL (GAUZE/BANDAGES/DRESSINGS) ×2 IMPLANT
GAUZE SPONGE 4X4 12PLY STRL (GAUZE/BANDAGES/DRESSINGS) ×1 IMPLANT
GLOVE BIOGEL PI IND STRL 7.5 (GLOVE) ×3 IMPLANT
GLOVE BIOGEL PI IND STRL 8 (GLOVE) ×1 IMPLANT
GLOVE ORTHO TXT STRL SZ7.5 (GLOVE) ×2 IMPLANT
GLOVE SURG ENC MOIS LTX SZ6 (GLOVE) ×1 IMPLANT
GLOVE SURG ORTHO 8.0 STRL STRW (GLOVE) ×1 IMPLANT
GOWN STRL REUS W/ TWL LRG LVL3 (GOWN DISPOSABLE) ×2 IMPLANT
GOWN STRL REUS W/TWL LRG LVL3 (GOWN DISPOSABLE) ×2
HANDLE YANKAUER SUCT OPEN TIP (MISCELLANEOUS) IMPLANT
HANDPIECE INTERPULSE COAX TIP (DISPOSABLE) ×1
IMMOBILIZER KNEE 20 (SOFTGOODS) IMPLANT
IMMOBILIZER KNEE 20 THIGH 36 (SOFTGOODS) IMPLANT
IMMOBILIZER KNEE 22 UNIV (SOFTGOODS) ×1 IMPLANT
IMMOBILIZER KNEE 24 THIGH 36 (MISCELLANEOUS) IMPLANT
IMMOBILIZER KNEE 24 UNIV (MISCELLANEOUS) IMPLANT
INSERT TIB CMT MED KNEE 7 5 RT (Insert) IMPLANT
KIT BASIN OR (CUSTOM PROCEDURE TRAY) ×1 IMPLANT
KIT TURNOVER KIT B (KITS) ×1 IMPLANT
MANIFOLD NEPTUNE II (INSTRUMENTS) ×1 IMPLANT
MARKER SPHERE PSV REFLC THRD 5 (MARKER) ×3 IMPLANT
NDL 18GX1X1/2 (RX/OR ONLY) (NEEDLE) ×2 IMPLANT
NEEDLE 18GX1X1/2 (RX/OR ONLY) (NEEDLE) ×2 IMPLANT
NS IRRIG 1000ML POUR BTL (IV SOLUTION) ×1 IMPLANT
PACK TOTAL JOINT (CUSTOM PROCEDURE TRAY) ×1 IMPLANT
PACK UNIVERSAL I (CUSTOM PROCEDURE TRAY) ×1 IMPLANT
PAD ARMBOARD 7.5X6 YLW CONV (MISCELLANEOUS) ×2 IMPLANT
PADDING CAST COTTON 6X4 STRL (CAST SUPPLIES) ×2 IMPLANT
PIN SCHANZ 4MM 130MM (PIN) IMPLANT
SET HNDPC FAN SPRY TIP SCT (DISPOSABLE) ×1 IMPLANT
SOLUTION IRRIG SURGIPHOR (IV SOLUTION) IMPLANT
SPONGE T-LAP 18X18 ~~LOC~~+RFID (SPONGE) IMPLANT
STRIP CLOSURE SKIN 1/2X4 (GAUZE/BANDAGES/DRESSINGS) ×2 IMPLANT
SUCTION TUBE FRAZIER 10FR DISP (SUCTIONS) ×1 IMPLANT
SUT MNCRL AB 3-0 PS2 18 (SUTURE) IMPLANT
SUT MNCRL AB 4-0 PS2 18 (SUTURE) ×1 IMPLANT
SUT STRATAFIX 1PDS 45CM VIOLET (SUTURE) IMPLANT
SUT VIC AB 1 CT1 27 (SUTURE) ×3
SUT VIC AB 1 CT1 27XBRD ANBCTR (SUTURE) ×3 IMPLANT
SUT VIC AB 2-0 CT1 27 (SUTURE) ×4
SUT VIC AB 2-0 CT1 TAPERPNT 27 (SUTURE) ×3 IMPLANT
SYR 3ML LL SCALE MARK (SYRINGE) ×1 IMPLANT
SYR 50ML SLIP (SYRINGE) ×1 IMPLANT
TOWEL GREEN STERILE (TOWEL DISPOSABLE) ×1 IMPLANT
TOWEL GREEN STERILE FF (TOWEL DISPOSABLE) ×1 IMPLANT
TRAY FOLEY MTR SLVR 16FR STAT (SET/KITS/TRAYS/PACK) ×1 IMPLANT
WATER STERILE IRR 1000ML POUR (IV SOLUTION) ×1 IMPLANT
WRAP KNEE MAXI GEL POST OP (GAUZE/BANDAGES/DRESSINGS) ×1 IMPLANT

## 2023-04-07 NOTE — Plan of Care (Signed)

## 2023-04-07 NOTE — Progress Notes (Signed)
Patient ID: Tanner [name], male   DOB: 1945-09-25, 77 y.o.   MRN: [id number]  Asked to see and evaluate his right knee Admitted for right LE edema and cellulitis  Right knee exam: 5-7 mm diameter reactive tissue anterior proximal knee wound No significant erythema but some warmth noted ROM reveals stiffness but not significant pain  Plan: He will need to go to the OR for at least an I&D poly exchange to manage his knee acutely in this current setting Trying to find OR time this evening verus later this week Orders to follow  On Daptomycin and Rocephin for enterococcus bacteremia

## 2023-04-07 NOTE — Progress Notes (Signed)
Hypoglycemic Event  CBG: 59mg /dl  Treatment: V56 25 mL (12.5 gm)  Symptoms: Hungry  Follow-up CBG: [id number] CBG Result:88mg /dl  Possible Reasons for Event: Inadequate meal intake  Comments/MD notified:Dr.Opyd    Tonna Boehringer

## 2023-04-07 NOTE — Progress Notes (Signed)
PHARMACY - PHYSICIAN COMMUNICATION CRITICAL VALUE ALERT - BLOOD CULTURE IDENTIFICATION (BCID)  Tanner [name] is an 77 y.o. male who presented to [facility] on 04/06/2023 with a chief complaint of cellulitis of the right lower extremity.   Assessment:  77 yo male presenting with RLE cellulitis. BCID positive for enterococcus faecalis, strep species and staph aureus with mec A resistance detected in 2/3. Of note, knee culture also growing rare GNRs on the gram stain. ID will be automatically consulted  Name of physician (or Provider) Contacted: Dr. Jerral Ralph   Current antibiotics: Vancomycin and ceftriaxone - ID will be consulted, likely will change to daptomycin with his renal function  Changes to prescribed antibiotics recommended:  Patient is on recommended antibiotics - No changes needed  Results for orders placed or performed during the hospital encounter of 04/06/23  Blood Culture ID Panel (Reflexed) (Collected: 04/06/2023  3:04 PM)  Result Value Ref Range   Enterococcus faecalis DETECTED (A) NOT DETECTED   Enterococcus Faecium NOT DETECTED NOT DETECTED   Listeria monocytogenes NOT DETECTED NOT DETECTED   Staphylococcus species DETECTED (A) NOT DETECTED   Staphylococcus aureus (BCID) DETECTED (A) NOT DETECTED   Staphylococcus epidermidis NOT DETECTED NOT DETECTED   Staphylococcus lugdunensis NOT DETECTED NOT DETECTED   Streptococcus species DETECTED (A) NOT DETECTED   Streptococcus agalactiae NOT DETECTED NOT DETECTED   Streptococcus pneumoniae NOT DETECTED NOT DETECTED   Streptococcus pyogenes NOT DETECTED NOT DETECTED   A.calcoaceticus-baumannii NOT DETECTED NOT DETECTED   Bacteroides fragilis NOT DETECTED NOT DETECTED   Enterobacterales NOT DETECTED NOT DETECTED   Enterobacter cloacae complex NOT DETECTED NOT DETECTED   Escherichia coli NOT DETECTED NOT DETECTED   Klebsiella aerogenes NOT DETECTED NOT DETECTED   Klebsiella oxytoca NOT DETECTED NOT DETECTED   Klebsiella  pneumoniae NOT DETECTED NOT DETECTED   Proteus species NOT DETECTED NOT DETECTED   Salmonella species NOT DETECTED NOT DETECTED   Serratia marcescens NOT DETECTED NOT DETECTED   Haemophilus influenzae NOT DETECTED NOT DETECTED   Neisseria meningitidis NOT DETECTED NOT DETECTED   Pseudomonas aeruginosa NOT DETECTED NOT DETECTED   Stenotrophomonas maltophilia NOT DETECTED NOT DETECTED   Candida albicans NOT DETECTED NOT DETECTED   Candida auris NOT DETECTED NOT DETECTED   Candida glabrata NOT DETECTED NOT DETECTED   Candida krusei NOT DETECTED NOT DETECTED   Candida parapsilosis NOT DETECTED NOT DETECTED   Candida tropicalis NOT DETECTED NOT DETECTED   Cryptococcus neoformans/gattii NOT DETECTED NOT DETECTED   Meth resistant mecA/C and MREJ DETECTED (A) NOT DETECTED   Vancomycin resistance NOT DETECTED NOT DETECTED    [name] [name] 04/07/2023  8:32 AM

## 2023-04-07 NOTE — Anesthesia Postprocedure Evaluation (Signed)
Anesthesia Post Note  Patient: Tanner [name]  Procedure(s) Performed: IRRIGATION AND DEBRIDEMENT KNEE WITH POLY EXCHANGE (Right: Knee)     Patient location during evaluation: PACU Anesthesia Type: General Level of consciousness: awake and alert, patient cooperative and oriented Pain management: pain level controlled Vital Signs Assessment: post-procedure vital signs reviewed and stable Respiratory status: spontaneous breathing, nonlabored ventilation and respiratory function stable Cardiovascular status: blood pressure returned to baseline and stable Postop Assessment: no apparent nausea or vomiting Anesthetic complications: no   No notable events documented.  Last Vitals:  Vitals:   04/07/23 1940 04/07/23 2000  BP: (!) 138/55 (!) 120/48  Pulse: 68 66  Resp: 14 14  Temp: 36.7 C 36.4 C  SpO2: 98% 100%    Last Pain:  Vitals:   04/07/23 2000  TempSrc: Oral  PainSc:                  Jeffrey Voth,E. Tevis Conger

## 2023-04-07 NOTE — Brief Op Note (Signed)
04/06/2023 - 04/07/2023  6:46 PM  PATIENT:  Tanner [name]  77 y.o. male  PRE-OPERATIVE DIAGNOSIS:  Infected right total knee replacement  POST-OPERATIVE DIAGNOSIS:  Infected right total knee replacement  PROCEDURE:  Procedure(s): IRRIGATION AND DEBRIDEMENT KNEE WITH POLY EXCHANGE (Right)  SURGEON:  Surgeons and Role:    Durene Romans, MD - Primary  PHYSICIAN ASSISTANT: Rosalene Billings, PA-C  ANESTHESIA:   general  EBL:  <200 cc  BLOOD ADMINISTERED:none  DRAINS: none   LOCAL MEDICATIONS USED:  OTHER 1gm of Vancomycin powder  SPECIMEN:  Source of Specimen:  right knee tissue and fluid  DISPOSITION OF SPECIMEN:  PATHOLOGY  COUNTS:  YES  TOURNIQUET:   Total Tourniquet Time Documented: Thigh (Right) - 36 minutes Total: Thigh (Right) - 36 minutes   DICTATION: .Other Dictation: Dictation Number [id number]  PLAN OF CARE: Admit to inpatient   PATIENT DISPOSITION:  PACU - hemodynamically stable.   Delay start of Pharmacological VTE agent (>24hrs) due to surgical blood loss or risk of bleeding: no

## 2023-04-07 NOTE — Op Note (Signed)
NAME: Tanner [name], [facility] MEDICAL RECORD NO: [id number] ACCOUNT NO: [id number] DATE OF BIRTH: 19-Apr-1946 FACILITY: [facility] LOCATION: [id number] PHYSICIAN: Madlyn Frankel. Charlann Boxer, MD  Operative Report   DATE OF PROCEDURE: 04/07/2023  PREOPERATIVE DIAGNOSIS:  Infected right total knee arthroplasty.  POSTOPERATIVE DIAGNOSIS:  Infected right total knee arthroplasty.  PROCEDURE: 1.  Excisional and non-excisional debridement of right knee.  Excision of the knee included skin incision of about 8 inches extended proximally probably an inch on each side with removal of skin, subcutaneous, nonviable fat tendon, as well as synovium.   Non-excisional debridement was carried out with 3 liters of normal saline solution as well as a 500 mL Surgiphor, Betadine based solution. 2.  Polyethylene revision removing his old size 5 polyethylene cruciate retaining medial stabilized insert to match the 7 femur and replacing it with the 5 mm cruciate retaining medial stabilized insert.  SURGEON:  Madlyn Frankel. Charlann Boxer, MD  ASSISTANT:  Rosalene Billings, PA-C.  Note that Ms. Domenic Schwab was present for the entirety of the case from preoperative position, perioperative management of the operative extremity, general facilitation of the case and primary wound closure.  ANESTHESIA:  General.  BLOOD LOSS:  Less than 200 mL.  TOURNIQUET:  Up for 36 minutes at 250 mmHg.  DRAINS:  None.  COMPLICATIONS:  None.  INDICATIONS FOR PROCEDURE:  The patient is a very pleasant 77 year old male who underwent index right total knee arthroplasty in July of this year.  He had progressed fairly well without obvious wound complications in his perioperative period.  However,  he was dealing with some lower extremity cellulitis and edema.  I had not seen him in the office since our last visit in August, where he had progressed well enough to be followed up at an annual checkup.  Over the past month, he has been in and out of  the hospital for several issues  including the potential diagnosis of pneumonia associated with sepsis.  However, it was noted that he was having some right knee pain and swelling associated with lower extremity edema and erythema.  It was in until this  most recent admission where he was noted to have a wound that was progressing on the proximal aspect of the incision.  My partner was on call last night and received the consult and subsequently contacted me for evaluation.  He was seen and evaluated and  noted to have this fungating nonhealing wound on the proximal aspect of the incision measuring about 8 mm in diameter.  Based on the appearance of this, it was obvious that there was underlying infection that need to be addressed.  We discussed managing  his knee in this current state.  I recommended at this point that we perform an I and D and poly exchange of his knee with 6 weeks of IV antibiotics and see if we can improve his overall health and function before proceeding with any potential  definitive surgery if needed or be fortunate enough to treat the infection with suppressive antibiotics.  The risks of recurrent or persistent infection were discussed and reviewed.  Standard risk for further infection, DVT, and need for future surgeries  reviewed were discussed.  Consent was obtained for management of his current infection.  DESCRIPTION OF PROCEDURE:  The patient was brought to the operative theater.  Once adequate anesthesia, preoperative antibiotics previously administered as he is currently on daptomycin and Rocephin on the floor.  He was positioned supine with a right  thigh tourniquet placed.  The right lower extremity was then prepped and draped in sterile fashion.  A timeout was performed identifying the patient, planned procedure, and extremity.  We identified his old incision extended again proximal and distal for  exposure purposes and marked out an elliptically cut out the area where he had this nonhealing wound.  Soft  tissue exposure was carried out.  He had obvious purulence in the proximal aspect of his wound.  I tried to aspirate purulent fluid for culture  analysis.  However, the soft tissues and inflammation was thick enough that I was unable to do this successfully.  Thus, our culture was sent as tissue involving the right knee.  As I exposed his knee, I was debriding all nonviable tissue and collecting  samples.  There was an extensive synovectomy and scar debridement carried out including all the subcutaneous layer as well as the medial and lateral gutters, the parapatellar region and the suprapatellar region.  I debrided as much of the nonviable  appearing tissue around the tendon.  It appeared that some of this infection had ruptured through his quadriceps tendon and thus I was able to debride this area, removing some of the nonviable tendinous structures to get back to healthy appearing tissue.   Once I was satisfied with this overall debridement, we removed his old polyethylene insert to allow for posterior debridement.  Once this was carried out, we irrigated the knee with approximately 1500 mL of normal saline solution followed by the  bottles of Surgiphor Betadine solution.  Allowed the Betadine stay in his wound for about 5 minutes before we removed and reirrigated the knee with the remaining of the 3 liter bag of fluid.  At this point, we removed all of the used instruments and  towels and re-gloved.  We then opened up a new 5 mm insert to match the 7 femur for this cruciate retaining medial stabilized implant.  The implant was snapped into place.  At this point, we let the tourniquet down after 36 minutes.  There was multiple  areas of punctate bleeding from the synovectomy, but no obvious hemostasis obtained.  We then used a gram of vancomycin into the deep wound and reapproximated the extensor mechanism using a combination of #1 Vicryl and #1 Stratafix suture.  The remainder  of wound was closed with  2-0 Vicryl and a running Monocryl stitch.  The wound was clean, dry and dressed sterilely using surgical glue and Aquacel dressing.  He was then awoken from anesthesia and brought to the recovery room in stable condition,  tolerating the procedure well.  I reviewed the findings with his son.  Postoperatively, he will be weightbearing as tolerated.  Infectious disease has been consulted.  He will need a PICC line placed.  We will wait and see if there is any cultures that return positive from the intraoperative tissue thus obtained.   Otherwise, he had already had positive blood cultures with enterococcus present.  I would recommend 6 weeks of IV antibiotics followed by potential oral suppressive antibiotics as directed by infectious disease with plans to follow up with [name] in 2 weeks.   PUS D: 04/07/2023 6:46:20 pm T: 04/07/2023 8:30:00 pm  JOB: [id number]/ [id number]

## 2023-04-07 NOTE — Discharge Instructions (Addendum)
INSTRUCTIONS AFTER JOINT REPLACEMENT   Remove items at home which could result in a fall. This includes throw rugs or furniture in walking pathways ICE to the affected joint every three hours while awake for 30 minutes at a time, for at least the first 3-5 days, and then as needed for pain and swelling.  Continue to use ice for pain and swelling. You may notice swelling that will progress down to the foot and ankle.  This is normal after surgery.  Elevate your leg when you are not up walking on it.   Continue to use the breathing machine you got in the hospital (incentive spirometer) which will help keep your temperature down.  It is common for your temperature to cycle up and down following surgery, especially at night when you are not up moving around and exerting yourself.  The breathing machine keeps your lungs expanded and your temperature down.   DIET:  As you were doing prior to hospitalization, we recommend a well-balanced diet.  DRESSING / WOUND CARE / SHOWERING  Keep the surgical dressing until follow up.  The dressing is water proof, so you can shower without any extra covering.  IF THE DRESSING FALLS OFF or the wound gets wet inside, change the dressing with sterile gauze.  Please use good hand washing techniques before changing the dressing.  Do not use any lotions or creams on the incision until instructed by your surgeon.    ACTIVITY  Increase activity slowly as tolerated, but follow the weight bearing instructions below.   No driving for 6 weeks or until further direction given by your physician.  You cannot drive while taking narcotics.  No lifting or carrying greater than 10 lbs. until further directed by your surgeon. Avoid periods of inactivity such as sitting longer than an hour when not asleep. This helps prevent blood clots.  You may return to work once you are authorized by your doctor.     WEIGHT BEARING   Weight bearing as tolerated with assist device (walker, cane,  etc) as directed, use it as long as suggested by your surgeon or therapist, typically at least 4-6 weeks.   EXERCISES  Results after joint replacement surgery are often greatly improved when you follow the exercise, range of motion and muscle strengthening exercises prescribed by your doctor. Safety measures are also important to protect the joint from further injury. Any time any of these exercises cause you to have increased pain or swelling, decrease what you are doing until you are comfortable again and then slowly increase them. If you have problems or questions, call your caregiver or physical therapist for advice.   Rehabilitation is important following a joint replacement. After just a few days of immobilization, the muscles of the leg can become weakened and shrink (atrophy).  These exercises are designed to build up the tone and strength of the thigh and leg muscles and to improve motion. Often times heat used for twenty to thirty minutes before working out will loosen up your tissues and help with improving the range of motion but do not use heat for the first two weeks following surgery (sometimes heat can increase post-operative swelling).   These exercises can be done on a training (exercise) mat, on the floor, on a table or on a bed. Use whatever works the best and is most comfortable for you.    Use music or television while you are exercising so that the exercises are a pleasant break in your  day. This will make your life better with the exercises acting as a break in your routine that you can look forward to.   Perform all exercises about fifteen times, three times per day or as directed.  You should exercise both the operative leg and the other leg as well.  Exercises include:   Quad Sets - Tighten up the muscle on the front of the thigh (Quad) and hold for 5-10 seconds.   Straight Leg Raises - With your knee straight (if you were given a brace, keep it on), lift the leg to 60  degrees, hold for 3 seconds, and slowly lower the leg.  Perform this exercise against resistance later as your leg gets stronger.  Leg Slides: Lying on your back, slowly slide your foot toward your buttocks, bending your knee up off the floor (only go as far as is comfortable). Then slowly slide your foot back down until your leg is flat on the floor again.  Angel Wings: Lying on your back spread your legs to the side as far apart as you can without causing discomfort.  Hamstring Strength:  Lying on your back, push your heel against the floor with your leg straight by tightening up the muscles of your buttocks.  Repeat, but this time bend your knee to a comfortable angle, and push your heel against the floor.  You may put a pillow under the heel to make it more comfortable if necessary.   A rehabilitation program following joint replacement surgery can speed recovery and prevent re-injury in the future due to weakened muscles. Contact your doctor or a physical therapist for more information on knee rehabilitation.    CONSTIPATION  Constipation is defined medically as fewer than three stools per week and severe constipation as less than one stool per week.  Even if you have a regular bowel pattern at home, your normal regimen is likely to be disrupted due to multiple reasons following surgery.  Combination of anesthesia, postoperative narcotics, change in appetite and fluid intake all can affect your bowels.   YOU MUST use at least one of the following options; they are listed in order of increasing strength to get the job done.  They are all available over the counter, and you may need to use some, POSSIBLY even all of these options:    Drink plenty of fluids (prune juice may be helpful) and high fiber foods Colace 100 mg by mouth twice a day  Senokot for constipation as directed and as needed Dulcolax (bisacodyl), take with full glass of water  Miralax (polyethylene glycol) once or twice a day as  needed.  If you have tried all these things and are unable to have a bowel movement in the first 3-4 days after surgery call either your surgeon or your primary doctor.    If you experience loose stools or diarrhea, hold the medications until you stool forms back up.  If your symptoms do not get better within 1 week or if they get worse, check with your doctor.  If you experience "the worst abdominal pain ever" or develop nausea or vomiting, please contact the office immediately for further recommendations for treatment.   ITCHING:  If you experience itching with your medications, try taking only a single pain pill, or even half a pain pill at a time.  You can also use Benadryl over the counter for itching or also to help with sleep.   TED HOSE STOCKINGS:  Use stockings on both  legs until for at least 2 weeks or as directed by physician office. They may be removed at night for sleeping.  MEDICATIONS:  See your medication summary on the "After Visit Summary" that nursing will review with you.  You may have some home medications which will be placed on hold until you complete the course of blood thinner medication.  It is important for you to complete the blood thinner medication as prescribed.  PRECAUTIONS:  If you experience chest pain or shortness of breath - call 911 immediately for transfer to the hospital emergency department.   If you develop a fever greater that 101 F, purulent drainage from wound, increased redness or drainage from wound, foul odor from the wound/dressing, or calf pain - CONTACT YOUR SURGEON.                                                   FOLLOW-UP APPOINTMENTS:  If you do not already have a post-op appointment, please call the office for an appointment to be seen by your surgeon.  Guidelines for how soon to be seen are listed in your "After Visit Summary", but are typically between 1-4 weeks after surgery.  OTHER INSTRUCTIONS:   Knee Replacement:  Do not place pillow  under knee, focus on keeping the knee straight while resting. CPM instructions: 0-90 degrees, 2 hours in the morning, 2 hours in the afternoon, and 2 hours in the evening. Place foam block, curve side up under heel at all times except when in CPM or when walking.  DO NOT modify, tear, cut, or change the foam block in any way.  POST-OPERATIVE OPIOID TAPER INSTRUCTIONS: It is important to wean off of your opioid medication as soon as possible. If you do not need pain medication after your surgery it is ok to stop day one. Opioids include: Codeine, Hydrocodone(Norco, Vicodin), Oxycodone(Percocet, oxycontin) and hydromorphone amongst others.  Long term and even short term use of opiods can cause: Increased pain response Dependence Constipation Depression Respiratory depression And more.  Withdrawal symptoms can include Flu like symptoms Nausea, vomiting And more Techniques to manage these symptoms Hydrate well Eat regular healthy meals Stay active Use relaxation techniques(deep breathing, meditating, yoga) Do Not substitute Alcohol to help with tapering If you have been on opioids for less than two weeks and do not have pain than it is ok to stop all together.  Plan to wean off of opioids This plan should start within one week post op of your joint replacement. Maintain the same interval or time between taking each dose and first decrease the dose.  Cut the total daily intake of opioids by one tablet each day Next start to increase the time between doses. The last dose that should be eliminated is the evening dose.   MAKE SURE YOU:  Understand these instructions.  Get help right away if you are not doing well or get worse.    Thank you for letting us be a part of your medical care team.  It is a privilege we respect greatly.  We hope these instructions will help you stay on track for a fast and full recovery!    Information on my medicine - ELIQUIS (apixaban)  This medication  education was reviewed with me or my healthcare representative as part of my discharge preparation.    Why  was Eliquis prescribed for you? Eliquis was prescribed to treat blood clots that may have been found in the veins of your legs (deep vein thrombosis) or in your lungs (pulmonary embolism) and to reduce the risk of them occurring again.  What do You need to know about Eliquis ? Continue Eliquis 5 mg tablet taken TWICE daily.  Eliquis may be taken with or without food.   Try to take the dose about the same time in the morning and in the evening. If you have difficulty swallowing the tablet whole please discuss with your pharmacist how to take the medication safely.  Take Eliquis exactly as prescribed and DO NOT stop taking Eliquis without talking to the doctor who prescribed the medication.  Stopping may increase your risk of developing a new blood clot.  Refill your prescription before you run out.  After discharge, you should have regular check-up appointments with your healthcare provider that is prescribing your Eliquis.    What do you do if you miss a dose? If a dose of ELIQUIS is not taken at the scheduled time, take it as soon as possible on the same day and twice-daily administration should be resumed. The dose should not be doubled to make up for a missed dose.  Important Safety Information A possible side effect of Eliquis is bleeding. You should call your healthcare provider right away if you experience any of the following: Bleeding from an injury or your nose that does not stop. Unusual colored urine (red or dark brown) or unusual colored stools (red or black). Unusual bruising for unknown reasons. A serious fall or if you hit your head (even if there is no bleeding).  Some medicines may interact with Eliquis and might increase your risk of bleeding or clotting while on Eliquis. To help avoid this, consult your healthcare provider or pharmacist prior to using any  new prescription or non-prescription medications, including herbals, vitamins, non-steroidal anti-inflammatory drugs (NSAIDs) and supplements.  This website has more information on Eliquis (apixaban): [email or web address]

## 2023-04-07 NOTE — Progress Notes (Signed)
PROGRESS NOTE        PATIENT DETAILS Name: Tanner [name] Age: 77 y.o. Sex: male Date of Birth: Jan 18, 1946 Admit Date: 04/06/2023 Admitting Physician Angie Fava, DO OZH:[id number], [facility]  Brief Summary: Patient is a 77 y.o.  male chronic HFrEF, DM-2, HTN, PAF on Eliquis-sent right knee arthroplasty 12/15/2022-presented with generalized weakness/ground-level fall-along with right knee discomfort for the past several days-upon further evaluation-he was found to have AKI with polymicrobial bacteremia-likely secondary to periprosthetic right knee infection (gram-negative rod on synovial fluid culture)  Significant events: 10/22>> admit to [facility]  Significant studies: 3/6>> echo: EF 45-50% 10/23>> CXR: No PNA 10/23>> CT head: No acute intracranial abnormality 10/23>> CT C-spine: No fracture 10/23>> CT lumbar spine: No fracture 10/23>> x-ray right knee: Right knee arthroplasty without complication-joint effusion with subcutaneous edema anteriorly.  Significant microbiology data: 10/22>> blood culture: Gram-positive cocci in pairs/chains (BCID-Enterococcus, Staphylococcus, Streptococcus) 10/22>> synovial fluid culture: Gram-negative rod.  Procedures: None  Consults: Infectious disease Orthopedics  Subjective: Lying comfortably in bed-denies any chest pain or shortness of breath.  Sleeping when I saw him earlier this morning-easily arousable  Objective: Vitals: Blood pressure (!) 157/125, pulse 71, temperature 98.2 F (36.8 C), temperature source Oral, resp. rate 15, height 5\' 4"  (1.626 m), weight 100.8 kg, SpO2 95%.   Exam: Gen Exam:Alert awake-not in any distress HEENT:atraumatic, normocephalic Chest: B/L clear to auscultation anteriorly CVS:S1S2 regular Abdomen:soft non tender, non distended Extremities: Left leg wrapped-right knee wrapped-but tender/warmth. Neuro: Generalized weakness but nonfocal.  Pertinent Labs/Radiology:     Latest Ref Rng & Units 04/07/2023   12:06 AM 04/06/2023    2:00 PM 04/06/2023    1:51 PM  CBC  WBC 4.0 - 10.5 K/uL 14.4  20.8  21.5   Hemoglobin 13.0 - 17.0 g/dL 8.6  84.1  32.4   Hematocrit 39.0 - 52.0 % 28.9  34.3  33.6   Platelets 150 - 400 K/uL 306  389  433     Lab Results  Component Value Date   NA 135 04/07/2023   K 3.9 04/07/2023   CL 100 04/07/2023   CO2 21 (L) 04/07/2023      Assessment/Plan: AKI on CKD stage IIIb AKI likely hemodynamically mediated in the setting of bacteremia UA negative for proteinuria Check renal ultrasound Monitor urine output closely Follow electrolytes Avoid nephrotoxic agents. If no improvement-nephrology consultation.  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-history of right knee total arthroplasty July 2024 Culture data as above ID following Daptomycin/Rocephin Check TTE Follow culture data  Mechanical fall/generalized weakness Likely due to debility in the setting of AKI/bacteremia Imaging negative for fractures PT/OT eval  Chronic HFrEF Euvolemic Hold diuretics/Jardiance due to AKI  PAF Telemetry monitoring Eliquis on hold-for potential-right knee washout Resume anticoagulation when able  Normocytic anemia Due to acute illness/AKI-no indication of GI loss Follow CBC periodically  DM-2 CBG stable SSI  Recent Labs    04/07/23 0633 04/07/23 0745 04/07/23 1240  GLUCAP 88 111* 72    HTN BP ordered to be stable Resume Coreg over the next several days  Obesity: Estimated body mass index is 38.14 kg/m as calculated from the following:   Height as of this encounter: 5\' 4"  (1.626 m).   Weight as of this encounter: 100.8 kg.   Code status:   Code Status: Full Code   DVT Prophylaxis: SCDs  Start: 04/06/23 2154   Family Communication: Son-Patrick-[phone]-updated over the phone 10/23   Disposition Plan: Status is: Inpatient Remains inpatient appropriate because: Severity of  illness   Planned Discharge Destination:Skilled nursing facility   Diet: Diet Order             Diet NPO time specified  Diet effective midnight                     Antimicrobial agents: Anti-infectives (From admission, onward)    Start     Dose/Rate Route Frequency Ordered Stop   04/07/23 1600  cefTRIAXone (ROCEPHIN) 1 g in sodium chloride 0.9 % 100 mL IVPB  Status:  Discontinued        1 g 200 mL/hr over 30 Minutes Intravenous Every 24 hours 04/06/23 2159 04/07/23 0702   04/07/23 1600  cefTRIAXone (ROCEPHIN) 2 g in sodium chloride 0.9 % 100 mL IVPB        2 g 200 mL/hr over 30 Minutes Intravenous Every 24 hours 04/07/23 0702     04/06/23 2208  vancomycin variable dose per unstable renal function (pharmacist dosing)  Status:  Discontinued         Does not apply See admin instructions 04/06/23 2209 04/07/23 1100   04/06/23 1615  vancomycin (VANCOREADY) IVPB 2000 mg/400 mL        2,000 mg 200 mL/hr over 120 Minutes Intravenous  Once 04/06/23 1606 04/06/23 1939   04/06/23 1600  cefTRIAXone (ROCEPHIN) 2 g in sodium chloride 0.9 % 100 mL IVPB        2 g 200 mL/hr over 30 Minutes Intravenous  Once 04/06/23 1558 04/06/23 1726        MEDICATIONS: Scheduled Meds:  insulin aspart  0-6 Units Subcutaneous Q6H   Continuous Infusions:  cefTRIAXone (ROCEPHIN)  IV     PRN Meds:.acetaminophen **OR** acetaminophen, fentaNYL (SUBLIMAZE) injection, melatonin, naLOXone (NARCAN)  injection, ondansetron (ZOFRAN) IV   I have personally reviewed following labs and imaging studies  LABORATORY DATA: CBC: Recent Labs  Lab 04/03/23 0700 04/06/23 1351 04/06/23 1400 04/07/23 0006  WBC 15.2* 21.5* 20.8* 14.4*  NEUTROABS  --   --  17.1* 12.2*  HGB 12.4* 10.0* 10.3* 8.6*  HCT 42.0 33.6* 34.3* 28.9*  MCV 81.9 79.2* 79.2* 79.8*  PLT 314 433* 389 306    Basic Metabolic Panel: Recent Labs  Lab 04/06/23 1540 04/06/23 2047 04/07/23 0006  NA 134*  --  135  K 4.2  --  3.9  CL 98   --  100  CO2 22  --  21*  GLUCOSE 91  --  89  BUN 57*  --  54*  CREATININE 4.87*  --  4.64*  CALCIUM 8.4*  --  8.1*  MG  --  2.2 2.3    GFR: Estimated Creatinine Clearance: 14.1 mL/min (A) (by C-G formula based on SCr of 4.64 mg/dL (H)).  Liver Function Tests: Recent Labs  Lab 04/06/23 1540 04/07/23 0006  AST 36 31  ALT 27 25  ALKPHOS 87 80  BILITOT 0.5 0.5  PROT 5.8* 5.0*  ALBUMIN 2.5* 2.1*   Recent Labs  Lab 04/06/23 1540  LIPASE 37   No results for input(s): "AMMONIA" in the last 168 hours.  Coagulation Profile: No results for input(s): "INR", "PROTIME" in the last 168 hours.  Cardiac Enzymes: Recent Labs  Lab 04/06/23 1540  CKTOTAL 739*    BNP (last 3 results) No results for input(s): "PROBNP" in the last 8760  hours.  Lipid Profile: No results for input(s): "CHOL", "HDL", "LDLCALC", "TRIG", "CHOLHDL", "LDLDIRECT" in the last 72 hours.  Thyroid Function Tests: Recent Labs    04/07/23 0006  TSH 2.721    Anemia Panel: Recent Labs    04/07/23 0006  VITAMINB12 881  FOLATE 16.5  FERRITIN 197  TIBC 206*  IRON 10*    Urine analysis:    Component Value Date/Time   COLORURINE YELLOW 04/06/2023 1614   APPEARANCEUR CLEAR 04/06/2023 1614   LABSPEC 1.017 04/06/2023 1614   PHURINE 5.0 04/06/2023 1614   GLUCOSEU >=500 (A) 04/06/2023 1614   GLUCOSEU NEGATIVE 09/13/2007 1028   HGBUR NEGATIVE 04/06/2023 1614   BILIRUBINUR NEGATIVE 04/06/2023 1614   BILIRUBINUR negative 04/19/2015 1417   BILIRUBINUR neg 02/12/2013 1449   KETONESUR NEGATIVE 04/06/2023 1614   PROTEINUR NEGATIVE 04/06/2023 1614   UROBILINOGEN 0.2 04/19/2015 1417   UROBILINOGEN 0.2 mg/dL 04/11/2535 6440   NITRITE NEGATIVE 04/06/2023 1614   LEUKOCYTESUR NEGATIVE 04/06/2023 1614    Sepsis Labs: Lactic Acid, Venous    Component Value Date/Time   LATICACIDVEN 1.7 04/06/2023 1513    MICROBIOLOGY: Recent Results (from the past 240 hour(s))  Culture, blood (routine x 2)     Status:  None (Preliminary result)   Collection Time: 04/06/23  3:04 PM   Specimen: BLOOD RIGHT ARM  Result Value Ref Range Status   Specimen Description BLOOD RIGHT ARM  Final   Special Requests   Final    BOTTLES DRAWN AEROBIC AND ANAEROBIC Blood Culture adequate volume   Culture  Setup Time   Final    GRAM POSITIVE COCCI IN PAIRS IN CHAINS IN BOTH AEROBIC AND ANAEROBIC BOTTLES CRITICAL RESULT CALLED TO, READ BACK BY AND VERIFIED WITH: PHARMD Gareth Eagle [phone] [id number] FCP Performed at [facility], [address] [place], [place] [address]    Culture GRAM POSITIVE COCCI  Final   Report Status PENDING  Incomplete  Blood Culture ID Panel (Reflexed)     Status: Abnormal   Collection Time: 04/06/23  3:04 PM  Result Value Ref Range Status   Enterococcus faecalis DETECTED (A) NOT DETECTED Final    Comment: CRITICAL RESULT CALLED TO, READ BACK BY AND VERIFIED WITH: PHARMD RACHEL G. [phone] FCP    Enterococcus Faecium NOT DETECTED NOT DETECTED Final   Listeria monocytogenes NOT DETECTED NOT DETECTED Final   Staphylococcus species DETECTED (A) NOT DETECTED Final    Comment: CRITICAL RESULT CALLED TO, READ BACK BY AND VERIFIED WITH: PHARMD RACHEL G. [phone] FCP    Staphylococcus aureus (BCID) DETECTED (A) NOT DETECTED Final    Comment: Methicillin (oxacillin)-resistant Staphylococcus aureus (MRSA). MRSA is predictably resistant to beta-lactam antibiotics (except ceftaroline). Preferred therapy is vancomycin unless clinically contraindicated. Patient requires contact precautions if  hospitalized. CRITICAL RESULT CALLED TO, READ BACK BY AND VERIFIED WITH: PHARMD RACHEL G. [phone] FCP    Staphylococcus epidermidis NOT DETECTED NOT DETECTED Final   Staphylococcus lugdunensis NOT DETECTED NOT DETECTED Final   Streptococcus species DETECTED (A) NOT DETECTED Final    Comment: Not Enterococcus species, Streptococcus agalactiae, Streptococcus pyogenes, or Streptococcus pneumoniae. CRITICAL  RESULT CALLED TO, READ BACK BY AND VERIFIED WITH: PHARMD RACHEL G. [id number] FCP    Streptococcus agalactiae NOT DETECTED NOT DETECTED Final   Streptococcus pneumoniae NOT DETECTED NOT DETECTED Final   Streptococcus pyogenes NOT DETECTED NOT DETECTED Final   A.calcoaceticus-baumannii NOT DETECTED NOT DETECTED Final   Bacteroides fragilis NOT DETECTED NOT DETECTED Final  Enterobacterales NOT DETECTED NOT DETECTED Final   Enterobacter cloacae complex NOT DETECTED NOT DETECTED Final   Escherichia coli NOT DETECTED NOT DETECTED Final   Klebsiella aerogenes NOT DETECTED NOT DETECTED Final   Klebsiella oxytoca NOT DETECTED NOT DETECTED Final   Klebsiella pneumoniae NOT DETECTED NOT DETECTED Final   Proteus species NOT DETECTED NOT DETECTED Final   Salmonella species NOT DETECTED NOT DETECTED Final   Serratia marcescens NOT DETECTED NOT DETECTED Final   Haemophilus influenzae NOT DETECTED NOT DETECTED Final   Neisseria meningitidis NOT DETECTED NOT DETECTED Final   Pseudomonas aeruginosa NOT DETECTED NOT DETECTED Final   Stenotrophomonas maltophilia NOT DETECTED NOT DETECTED Final   Candida albicans NOT DETECTED NOT DETECTED Final   Candida auris NOT DETECTED NOT DETECTED Final   Candida glabrata NOT DETECTED NOT DETECTED Final   Candida krusei NOT DETECTED NOT DETECTED Final   Candida parapsilosis NOT DETECTED NOT DETECTED Final   Candida tropicalis NOT DETECTED NOT DETECTED Final   Cryptococcus neoformans/gattii NOT DETECTED NOT DETECTED Final   Meth resistant mecA/C and MREJ DETECTED (A) NOT DETECTED Final    Comment: CRITICAL RESULT CALLED TO, READ BACK BY AND VERIFIED WITH: PHARMD RACHEL G. [phone] FCP    Vancomycin resistance NOT DETECTED NOT DETECTED Final    Comment: Performed at [facility], [address] [place], [place] [address]  Culture, blood (routine x 2)     Status: None (Preliminary result)   Collection Time: 04/06/23  6:19 PM   Specimen: BLOOD RIGHT HAND   Result Value Ref Range Status   Specimen Description BLOOD RIGHT HAND  Final   Special Requests   Final    BOTTLES DRAWN AEROBIC ONLY Blood Culture results may not be optimal due to an inadequate volume of blood received in culture bottles   Culture   Final    NO GROWTH < 12 HOURS Performed at [facility], [address] [place], [place] [address]    Report Status PENDING  Incomplete  Aerobic Culture w Gram Stain (superficial specimen)     Status: None (Preliminary result)   Collection Time: 04/06/23  8:49 PM   Specimen: KNEE  Result Value Ref Range Status   Specimen Description KNEE  Final   Special Requests NONE  Final   Gram Stain   Final    ABUNDANT WBC PRESENT, PREDOMINANTLY PMN RARE GRAM NEGATIVE RODS    Culture   Final    NO GROWTH < 12 HOURS Performed at [facility], [address] [place], [place] [address]    Report Status PENDING  Incomplete    RADIOLOGY STUDIES/RESULTS: DG Knee 1-2 Views Right  Result Date: 04/06/2023 CLINICAL DATA:  Fall today, edema. Technologist notes state status post fluid removed from knee at the bedside. EXAM: RIGHT KNEE - 1-2 VIEW COMPARISON:  None Available. FINDINGS: Right knee arthroplasty in expected alignment. No periprosthetic lucency or fracture. Prior patellar resurfacing. No erosive change. Suspected joint effusion with subcutaneous edema anteriorly. Small foci of gas in the anterior soft tissue edema may be related to recent aspiration. IMPRESSION: 1. Right knee arthroplasty without complication. 2. Suspected joint effusion with subcutaneous edema anteriorly. Small foci of gas in the anterior soft tissue edema may be related to recent aspiration. Electronically Signed   By: Narda Rutherford M.D.   On: 04/06/2023 23:12   CT Head Wo Contrast  Result Date: 04/06/2023 CLINICAL DATA:  Head trauma EXAM: CT HEAD WITHOUT CONTRAST CT  CERVICAL SPINE WITHOUT CONTRAST TECHNIQUE: Multidetector CT imaging of the head and cervical  spine was performed following the standard protocol without intravenous contrast. Multiplanar CT image reconstructions of the cervical spine were also generated. RADIATION DOSE REDUCTION: This exam was performed according to the departmental dose-optimization program which includes automated exposure control, adjustment of the mA and/or kV according to patient size and/or use of iterative reconstruction technique. COMPARISON:  None Available. FINDINGS: CT HEAD FINDINGS Brain: There is no mass, hemorrhage or extra-axial collection. The size and configuration of the ventricles and extra-axial CSF spaces are normal. There is hypoattenuation of the periventricular white matter, most commonly indicating chronic ischemic microangiopathy. Vascular: No abnormal hyperdensity of the major intracranial arteries or dural venous sinuses. No intracranial atherosclerosis. Skull: The visualized skull base, calvarium and extracranial soft tissues are normal. Sinuses/Orbits: No fluid levels or advanced mucosal thickening of the visualized paranasal sinuses. No mastoid or middle ear effusion. The orbits are normal. CT CERVICAL SPINE FINDINGS Alignment: No static subluxation. Facets are aligned. Occipital condyles are normally positioned. Skull base and vertebrae: No acute fracture. Soft tissues and spinal canal: No prevertebral fluid or swelling. No visible canal hematoma. Disc levels: No advanced spinal canal or neural foraminal stenosis. Upper chest: Mild biapical emphysema. Other: Normal visualized paraspinal cervical soft tissues. IMPRESSION: 1. No acute intracranial abnormality. 2. Chronic ischemic microangiopathy. 3. No acute fracture or static subluxation of the cervical spine. Emphysema (ICD10-J43.9). Electronically Signed   By: Deatra Robinson M.D.   On: 04/06/2023 19:37   CT Cervical Spine Wo Contrast  Result Date: 04/06/2023 CLINICAL DATA:  Head trauma EXAM: CT HEAD WITHOUT CONTRAST CT CERVICAL SPINE WITHOUT CONTRAST  TECHNIQUE: Multidetector CT imaging of the head and cervical spine was performed following the standard protocol without intravenous contrast. Multiplanar CT image reconstructions of the cervical spine were also generated. RADIATION DOSE REDUCTION: This exam was performed according to the departmental dose-optimization program which includes automated exposure control, adjustment of the mA and/or kV according to patient size and/or use of iterative reconstruction technique. COMPARISON:  None Available. FINDINGS: CT HEAD FINDINGS Brain: There is no mass, hemorrhage or extra-axial collection. The size and configuration of the ventricles and extra-axial CSF spaces are normal. There is hypoattenuation of the periventricular white matter, most commonly indicating chronic ischemic microangiopathy. Vascular: No abnormal hyperdensity of the major intracranial arteries or dural venous sinuses. No intracranial atherosclerosis. Skull: The visualized skull base, calvarium and extracranial soft tissues are normal. Sinuses/Orbits: No fluid levels or advanced mucosal thickening of the visualized paranasal sinuses. No mastoid or middle ear effusion. The orbits are normal. CT CERVICAL SPINE FINDINGS Alignment: No static subluxation. Facets are aligned. Occipital condyles are normally positioned. Skull base and vertebrae: No acute fracture. Soft tissues and spinal canal: No prevertebral fluid or swelling. No visible canal hematoma. Disc levels: No advanced spinal canal or neural foraminal stenosis. Upper chest: Mild biapical emphysema. Other: Normal visualized paraspinal cervical soft tissues. IMPRESSION: 1. No acute intracranial abnormality. 2. Chronic ischemic microangiopathy. 3. No acute fracture or static subluxation of the cervical spine. Emphysema (ICD10-J43.9). Electronically Signed   By: Deatra Robinson M.D.   On: 04/06/2023 19:37   CT Lumbar Spine Wo Contrast  Result Date: 04/06/2023 CLINICAL DATA:  Fall EXAM: CT LUMBAR  SPINE WITHOUT CONTRAST TECHNIQUE: Multidetector CT imaging of the lumbar spine was performed without intravenous contrast administration. Multiplanar CT image reconstructions were also generated. RADIATION DOSE REDUCTION: This exam was performed according to the departmental dose-optimization program which includes  automated exposure control, adjustment of the mA and/or kV according to patient size and/or use of iterative reconstruction technique. COMPARISON:  CT abdomen pelvis 12/19/2013 FINDINGS: Segmentation: 5 lumbar type vertebrae. Alignment: Right convex scoliosis with apex at L2. Grade 1 anterolisthesis at L4-5. Vertebrae: Multilevel degenerative endplate remodeling throughout the lumbar spine, but greatest at L1-2 and L2-3. No acute fracture. No evidence of discitis-osteomyelitis. Paraspinal and other soft tissues: Enlarged prostate gland. Calcific aortic atherosclerosis. There is an incompletely visualized left renal contour abnormality that could be a solid mass. Disc levels: L1-2: Disc bulge and facet arthrosis with mild spinal canal stenosis. Mild left foraminal stenosis. L2-3: Disc bulge and facet arthrosis with moderate spinal canal stenosis. L3-4: Disc bulge and facet arthrosis with mild spinal canal stenosis. Mild left foraminal stenosis. L4-5: Severe facet arthrosis with disc bulge and anterolisthesis. Severe spinal canal stenosis. Mild right foraminal stenosis. L5-S1: Unremarkable aside from mild facet arthrosis. IMPRESSION: 1. No acute fracture or static subluxation of the lumbar spine. 2. Severe spinal canal stenosis at L4-5 due to combination of disc bulge, facet arthrosis and grade 1 anterolisthesis. 3. Incompletely visualized left renal contour abnormality that could be a solid mass. Recommend further evaluation with renal ultrasound or CT abdomen pelvis with contrast. Aortic Atherosclerosis (ICD10-I70.0). Electronically Signed   By: Deatra Robinson M.D.   On: 04/06/2023 19:22   DG Chest  Portable 1 View  Result Date: 04/06/2023 CLINICAL DATA:  Fall. Woke up on floor beside couch. Unknown down time. EXAM: PORTABLE CHEST 1 VIEW COMPARISON:  Chest radiographs 03/25/2023, 03/18/2023, 08/18/2022, 06/17/2022 FINDINGS: Cardiac silhouette and mediastinal contours are within limits. Mild calcification within the aortic arch. There are right-greater-than-left linear basilar densities that appear not significantly changed from 06/17/2022. Similar densities are seen on subsequent radiographs, suggesting chronic scarring. No definite acute airspace opacity. No pleural effusion or pneumothorax. Moderate multilevel degenerative disc changes of the thoracic spine. IMPRESSION: 1. No acute cardiopulmonary process. 2. Right-greater-than-left linear basilar densities that appear not significantly changed from 06/17/2022. Similar densities are seen on subsequent radiographs, suggesting chronic scarring. Electronically Signed   By: Neita Garnet M.D.   On: 04/06/2023 16:44     LOS: 1 day   Jeoffrey Massed, MD  [facility]    To contact the attending provider between 7A-7P or the covering provider during after hours 7P-7A, please log into the web site [email or web address] and access using universal [place] password for that web site. If you do not have the password, please call the hospital operator.  04/07/2023, 12:59 PM

## 2023-04-07 NOTE — Progress Notes (Signed)
PT Cancellation Note  Patient Details Name: Tanner [name] MRN: [id number] DOB: 07-14-1945   Cancelled Treatment:    Reason Eval/Treat Not Completed: Patient at procedure or test/unavailable.  Pt in OR, will see as appropriate 10/24 for evaluation. 04/07/2023  Jacinto Halim., PT Acute Rehabilitation Services [phone]  (office)   Tanner [name] 04/07/2023, 6:06 PM

## 2023-04-07 NOTE — Progress Notes (Signed)
Echocardiogram 2D Echocardiogram has been performed.  Tanner [name] 04/07/2023, 3:47 PM

## 2023-04-07 NOTE — Progress Notes (Addendum)
Pharmacy Antibiotic Note  Tanner [name] is a 77 y.o. male admitted on 04/06/2023 with bacteremia.  Pharmacy has been consulted for daptomycin dosing.   Patient received a load of 2000 mg of vancomycin in the ED on 10/22 ~ 1800. Due to reduced renal function will transition over to daptomycin.   Plan: Start daptomycin 800 mg every 48 hours 10/24 at 1400 Continue ceftriaxone 2 g every 24 hours per MD Trend cultures, renal function, and overall clinical picture  Height: 5\' 4"  (162.6 cm) Weight: 100.8 kg (222 lb 3.6 oz) IBW/kg (Calculated) : 59.2  Temp (24hrs), Avg:98.1 F (36.7 C), Min:97.7 F (36.5 C), Max:98.5 F (36.9 C)  Recent Labs  Lab 04/03/23 0700 04/06/23 1351 04/06/23 1400 04/06/23 1513 04/06/23 1540 04/07/23 0006  WBC 15.2* 21.5* 20.8*  --   --  14.4*  CREATININE  --   --   --   --  4.87* 4.64*  LATICACIDVEN  --   --   --  1.7  --   --     Estimated Creatinine Clearance: 14.1 mL/min (A) (by C-G formula based on SCr of 4.64 mg/dL (H)).    Allergies  Allergen Reactions   Lisinopril Other (See Comments)     Renal impairment   Amlodipine Swelling   Codeine Sulfate Itching and Nausea Only    Antimicrobials this admission: 10/22 Vancomycin x 1 10/22 Ceftriaxone >>  10/24 Daptomycin  Microbiology results: 10/22 BCx: GPCs in pairs and chains, BCID strep spp, MRSA, and E. Faecalis  10/22 superficial knee swab: rare GNRs   Thank you for allowing pharmacy to be a part of this patient's care.  Griffin Dakin 04/07/2023 12:17 PM

## 2023-04-07 NOTE — Anesthesia Preprocedure Evaluation (Addendum)
Anesthesia Evaluation  Patient identified by MRN, date of birth, ID band Patient awake    Reviewed: Allergy & Precautions, NPO status , Patient's Chart, lab work & pertinent test results  History of Anesthesia Complications Negative for: history of anesthetic complications  Airway Mallampati: II  TM Distance: >3 FB Neck ROM: Full    Dental no notable dental hx.    Pulmonary sleep apnea , former smoker, PE   Pulmonary exam normal breath sounds clear to auscultation       Cardiovascular hypertension, Pt. on medications and Pt. on home beta blockers +CHF and + DVT  Normal cardiovascular exam+ dysrhythmias (on Eliquis) Atrial Fibrillation  Rhythm:Regular Rate:Normal  TTE 2024 1. Left ventricular ejection fraction, by estimation, is 60 to 65%. The  left ventricle has normal function. The left ventricle has no regional  wall motion abnormalities. Left ventricular diastolic parameters were  normal.   2. Right ventricular systolic function is normal. The right ventricular  size is normal.   3. The mitral valve is normal in structure. No evidence of mitral valve  regurgitation. No evidence of mitral stenosis.   4. The aortic valve is normal in structure. Aortic valve regurgitation is  not visualized. No aortic stenosis is present.   5. The inferior vena cava is normal in size with greater than 50%  respiratory variability, suggesting right atrial pressure of 3 mmHg.   6. No obvious vegetations identified; consider TEE if clinically  indicated.     Neuro/Psych  PSYCHIATRIC DISORDERS Anxiety Depression    negative neurological ROS     GI/Hepatic negative GI ROS,,,  Endo/Other  diabetes, Type 2, Oral Hypoglycemic Agents    Renal/GU Renal InsufficiencyRenal diseasenegative Renal ROSLab Results      Component                Value               Date                      NA                       135                 04/07/2023                 CL                       100                 04/07/2023                K                        3.9                 04/07/2023                CO2                      21 (L)              04/07/2023                BUN                      54 (H)  04/07/2023                CREATININE               4.64 (H)            04/07/2023                GFRNONAA                 12 (L)              04/07/2023                CALCIUM                  8.1 (L)             04/07/2023                PHOS                     3.7                 03/23/2023                ALBUMIN                  2.1 (L)             04/07/2023                GLUCOSE                  89                  04/07/2023             negative genitourinary   Musculoskeletal negative musculoskeletal ROS (+) Arthritis ,    Abdominal   Peds  Hematology  (+) Blood dyscrasia, anemia Lab Results      Component                Value               Date                      WBC                      14.4 (H)            04/07/2023                HGB                      8.6 (L)             04/07/2023                HCT                      28.9 (L)            04/07/2023                MCV                      79.8 (L)            04/07/2023                PLT  306                 04/07/2023             Anesthesia Other Findings RIGHT KNEE INFECTION  Reproductive/Obstetrics                             Anesthesia Physical Anesthesia Plan  ASA: 3  Anesthesia Plan: General   Post-op Pain Management: Tylenol PO (pre-op)*   Induction: Intravenous  PONV Risk Score and Plan: 2 and Ondansetron, Dexamethasone and Treatment may vary due to age or medical condition  Airway Management Planned: LMA  Additional Equipment:   Intra-op Plan:   Post-operative Plan: Extubation in OR  Informed Consent: I have reviewed the patients History and Physical, chart, labs and discussed the  procedure including the risks, benefits and alternatives for the proposed anesthesia with the patient or authorized representative who has indicated his/her understanding and acceptance.     Dental advisory given  Plan Discussed with: CRNA and Surgeon  Anesthesia Plan Comments:         Anesthesia Quick Evaluation

## 2023-04-07 NOTE — Transfer of Care (Signed)
Immediate Anesthesia Transfer of Care Note  Patient: Tanner [name]  Procedure(s) Performed: IRRIGATION AND DEBRIDEMENT KNEE WITH POLY EXCHANGE (Right: Knee)  Patient Location: PACU  Anesthesia Type:General  Level of Consciousness: awake, alert , and oriented  Airway & Oxygen Therapy: Patient Spontanous Breathing and Patient connected to nasal cannula oxygen  Post-op Assessment: Report given to RN, Post -op Vital signs reviewed and stable, and Patient moving all extremities  Post vital signs: Reviewed and stable  Last Vitals:  Vitals Value Taken Time  BP    Temp    Pulse 69 04/07/23 1914  Resp 16 04/07/23 1914  SpO2 94 % 04/07/23 1914  Vitals shown include unfiled device data.  Last Pain:  Vitals:   04/07/23 1605  TempSrc: Oral  PainSc:          Complications: No notable events documented.

## 2023-04-07 NOTE — Anesthesia Procedure Notes (Signed)
Procedure Name: Intubation Date/Time: 04/07/2023 5:30 PM  Performed by: Georgianne Fick D, CRNAPre-anesthesia Checklist: Patient identified, Emergency Drugs available, Suction available and Patient being monitored Patient Re-evaluated:Patient Re-evaluated prior to induction Oxygen Delivery Method: Circle System Utilized Preoxygenation: Pre-oxygenation with 100% oxygen Induction Type: IV induction Ventilation: Mask ventilation without difficulty Laryngoscope Size: Miller and 2 Grade View: Grade I Tube type: Oral Number of attempts: 1 Airway Equipment and Method: Stylet and Oral airway Placement Confirmation: ETT inserted through vocal cords under direct vision, positive ETCO2 and breath sounds checked- equal and bilateral Secured at: 23 cm Tube secured with: Tape Dental Injury: Teeth and Oropharynx as per pre-operative assessment

## 2023-04-07 NOTE — Consult Note (Cosign Needed Addendum)
[facility]    Date of Admission:  04/06/2023    Total days of antibiotics 2 Vancomycin 10/22 Ceftriaxone 10/22 >>        Reason for Consult: Right lower extremity cellulitis    Referring Provider: Dr. Dewayne Shorter  Assessment: Tanner [name] presented with fall, weakness, and right knee pain and was found to have polymicrobial bacteriemia with Enterococcus faecalis, MRSA, and Streptococcus species. Source of infection is the right knee with sinus tract and drainage.   WBC 14.4 this am down from 20.8. He is afebrile. Right knee looks visibly infected with draining wound. Appears to have acute kidney injury with Creatinine 4.64 (baseline 1.3-1.5). Will switch vanc to dapto. Will continue rocephin until surgical culture results are available. Pt is scheduled for debridement and poly exchange today. Will need TTE to rule out endocarditis given MRSA bacteremia and repeat blood cultures. Therapeutic drug monitoring of CK levels while on daptomycin and for any signs/symptoms of eosinophilic pneumonia. Plan of care discussed with Tanner [name]. Remaining medical and supportive care per Internal Medicine.   Plan: Change vancomycin to daptomycin.  TTE/TEE to check for endocarditis.  Right knee washout/poly exchange per ortho  Monitor CK levels for therapeutic drug monitoring  Repeat blood cultures for clearance of bacteremia. Remaining medical and supportive care per Internal Medicine.    I have personally spent 30 minutes involved in face-to-face and non-face-to-face activities for this patient on the day of the visit. Professional time spent includes the following activities: Preparing to see the patient (review of tests), Obtaining and/or reviewing separately obtained history (admission/discharge record), Performing a medically appropriate examination and/or evaluation , Ordering medications/tests/procedures, referring and communicating with other health care professionals,  Documenting clinical information in the EMR, Independently interpreting results (not separately reported), Communicating results to the patient/family/caregiver, Counseling and educating the patient/family/caregiver and Care coordination (not separately reported).   Principal Problem:   Infection of prosthetic right knee joint (HCC) Active Problems:   MRSA bacteremia   Enterococcal bacteremia   Streptococcal bacteremia   Essential hypertension   Acute renal failure superimposed on stage 3b chronic kidney disease (HCC)   DM2 (diabetes mellitus, type 2) (HCC)   Chronic systolic CHF (congestive heart failure) (HCC)   Paroxysmal atrial fibrillation (HCC)   Generalized weakness   Fall at home, initial encounter   Acute on chronic anemia   Hyperlipidemia    insulin aspart  0-6 Units Subcutaneous Q6H    HPI: Tanner [name] is a 77 y.o. male HFrEF, type 2 diabetes, diabetic peripheral polyneuropathy, a-fib on eliquis, and HTN. He presented to the ED after a fall complaining of generalized weakness. He is also complaining of right knee pain, swelling, redness, and reduced range of motion. He has had a right total knee replacement 12/15/2022. He does not think he hit his knee during the fall.  ED labs were remarkable for creatinine 4.87, WBC 20.8. Blood cultures were positive for MRSA, strep, and e. Faecalis. Right knee x-ray showed suspected joint effusion with anterior subcutaneous edema, small foci of gas in soft tissue edema. He was put on vanc and ceftriaxone.   Orthopedics was consulted, superficial knee culture grew gram negative rods. He is scheduled for poly exchange today.   On exam he has complaints of right knee swelling, pain, and redness. He denies fever or chills. He feels improved from admission.   Review of Systems: Review of Systems  Constitutional:  Negative for chills,  fever and weight loss.  Respiratory: Negative.    Cardiovascular: Negative.   Musculoskeletal:         Swelling and tenderness right knee  Skin:        Draining wound on rt knee  Neurological: Negative.     Past Medical History:  Diagnosis Date   Acute kidney failure (HCC)    Anxiety    Arthritis    Back pain    BPH (benign prostatic hypertrophy)    CHF (congestive heart failure) (HCC)    Clostridium difficile infection    Clotting disorder (HCC)    Depression    Diabetes (HCC) 12/11/2016   type 2    DVT (deep venous thrombosis) (HCC)    DVT of deep femoral vein, right (HCC) 06/29/2018   Edema, lower extremity    High cholesterol    Hypertension    Joint pain    Low back pain potentially associated with spinal stenosis    Neuromuscular disorder (HCC)    Neuropathy of lower extremity    bilateral   OSA (obstructive sleep apnea)    pt denies    Osteoarthritis    PE (pulmonary embolism)    Pneumonia    hx of x 2    Ventral hernia     Social History   Tobacco Use   Smoking status: Former    Current packs/day: 0.00    Average packs/day: 2.0 packs/day for 33.0 years (66.1 ttl pk-yrs)    Types: Cigarettes    Start date: 08/30/1968    Quit date: 07/02/2001    Years since quitting: 21.7   Smokeless tobacco: Never   Tobacco comments:    quit 12 years ago  Vaping Use   Vaping status: Never Used  Substance Use Topics   Alcohol use: Yes    Comment: seldom    Drug use: No    Family History  Problem Relation Age of Onset   Diabetes Mother    Pneumonia Mother    Alzheimer's disease Mother    Hyperlipidemia Mother    Thyroid disease Mother    Depression Mother    Other Father 42       Drowned on boating accident   Colon cancer Neg Hx    Colon polyps Neg Hx    Allergies  Allergen Reactions   Lisinopril Other (See Comments)     Renal impairment   Amlodipine Swelling   Codeine Sulfate Itching and Nausea Only    OBJECTIVE: Blood pressure (!) 157/125, pulse 71, temperature 98.2 F (36.8 C), temperature source Oral, resp. rate 15, height 5\' 4"  (1.626 m), weight  100.8 kg, SpO2 95%.  Physical Exam Constitutional:      Appearance: He is obese.  Cardiovascular:     Rate and Rhythm: Normal rate and regular rhythm.  Pulmonary:     Effort: Pulmonary effort is normal.     Breath sounds: No wheezing or rales.  Musculoskeletal:        General: Swelling and tenderness present.     Comments: Swelling, tenderness, and warmth right knee  Skin:    Comments: 1cm diameter draining lesion on anterior right knee  Neurological:     Mental Status: He is alert and oriented to person, place, and time.     Lab Results Lab Results  Component Value Date   WBC 14.4 (H) 04/07/2023   HGB 8.6 (L) 04/07/2023   HCT 28.9 (L) 04/07/2023   MCV 79.8 (L) 04/07/2023   PLT 306  04/07/2023    Lab Results  Component Value Date   CREATININE 4.64 (H) 04/07/2023   BUN 54 (H) 04/07/2023   NA 135 04/07/2023   K 3.9 04/07/2023   CL 100 04/07/2023   CO2 21 (L) 04/07/2023    Lab Results  Component Value Date   ALT 25 04/07/2023   AST 31 04/07/2023   ALKPHOS 80 04/07/2023   BILITOT 0.5 04/07/2023     Microbiology: Recent Results (from the past 240 hour(s))  Culture, blood (routine x 2)     Status: None (Preliminary result)   Collection Time: 04/06/23  3:04 PM   Specimen: BLOOD RIGHT ARM  Result Value Ref Range Status   Specimen Description BLOOD RIGHT ARM  Final   Special Requests   Final    BOTTLES DRAWN AEROBIC AND ANAEROBIC Blood Culture adequate volume   Culture  Setup Time   Final    GRAM POSITIVE COCCI IN PAIRS IN CHAINS IN BOTH AEROBIC AND ANAEROBIC BOTTLES CRITICAL RESULT CALLED TO, READ BACK BY AND VERIFIED WITH: PHARMD Gareth Eagle [phone] FCP Performed at [facility], [address] [place], [place] [address]    Culture GRAM POSITIVE COCCI  Final   Report Status PENDING  Incomplete  Blood Culture ID Panel (Reflexed)     Status: Abnormal   Collection Time: 04/06/23  3:04 PM  Result Value Ref Range Status   Enterococcus faecalis DETECTED  (A) NOT DETECTED Final    Comment: CRITICAL RESULT CALLED TO, READ BACK BY AND VERIFIED WITH: PHARMD RACHEL G. [phone] FCP    Enterococcus Faecium NOT DETECTED NOT DETECTED Final   Listeria monocytogenes NOT DETECTED NOT DETECTED Final   Staphylococcus species DETECTED (A) NOT DETECTED Final    Comment: CRITICAL RESULT CALLED TO, READ BACK BY AND VERIFIED WITH: PHARMD RACHEL G. [phone] FCP    Staphylococcus aureus (BCID) DETECTED (A) NOT DETECTED Final    Comment: Methicillin (oxacillin)-resistant Staphylococcus aureus (MRSA). MRSA is predictably resistant to beta-lactam antibiotics (except ceftaroline). Preferred therapy is vancomycin unless clinically contraindicated. Patient requires contact precautions if  hospitalized. CRITICAL RESULT CALLED TO, READ BACK BY AND VERIFIED WITH: PHARMD RACHEL G. [phone] FCP    Staphylococcus epidermidis NOT DETECTED NOT DETECTED Final   Staphylococcus lugdunensis NOT DETECTED NOT DETECTED Final   Streptococcus species DETECTED (A) NOT DETECTED Final    Comment: Not Enterococcus species, Streptococcus agalactiae, Streptococcus pyogenes, or Streptococcus pneumoniae. CRITICAL RESULT CALLED TO, READ BACK BY AND VERIFIED WITH: PHARMD RACHEL G. [phone] FCP    Streptococcus agalactiae NOT DETECTED NOT DETECTED Final   Streptococcus pneumoniae NOT DETECTED NOT DETECTED Final   Streptococcus pyogenes NOT DETECTED NOT DETECTED Final   A.calcoaceticus-baumannii NOT DETECTED NOT DETECTED Final   Bacteroides fragilis NOT DETECTED NOT DETECTED Final   Enterobacterales NOT DETECTED NOT DETECTED Final   Enterobacter cloacae complex NOT DETECTED NOT DETECTED Final   Escherichia coli NOT DETECTED NOT DETECTED Final   Klebsiella aerogenes NOT DETECTED NOT DETECTED Final   Klebsiella oxytoca NOT DETECTED NOT DETECTED Final   Klebsiella pneumoniae NOT DETECTED NOT DETECTED Final   Proteus species NOT DETECTED NOT DETECTED Final   Salmonella species  NOT DETECTED NOT DETECTED Final   Serratia marcescens NOT DETECTED NOT DETECTED Final   Haemophilus influenzae NOT DETECTED NOT DETECTED Final   Neisseria meningitidis NOT DETECTED NOT DETECTED Final   Pseudomonas aeruginosa NOT DETECTED NOT DETECTED Final   Stenotrophomonas maltophilia NOT DETECTED NOT DETECTED Final  Candida albicans NOT DETECTED NOT DETECTED Final   Candida auris NOT DETECTED NOT DETECTED Final   Candida glabrata NOT DETECTED NOT DETECTED Final   Candida krusei NOT DETECTED NOT DETECTED Final   Candida parapsilosis NOT DETECTED NOT DETECTED Final   Candida tropicalis NOT DETECTED NOT DETECTED Final   Cryptococcus neoformans/gattii NOT DETECTED NOT DETECTED Final   Meth resistant mecA/C and MREJ DETECTED (A) NOT DETECTED Final    Comment: CRITICAL RESULT CALLED TO, READ BACK BY AND VERIFIED WITH: PHARMD RACHEL G. [phone] FCP    Vancomycin resistance NOT DETECTED NOT DETECTED Final    Comment: Performed at [facility], [address] [place], [place] [address]  Culture, blood (routine x 2)     Status: None (Preliminary result)   Collection Time: 04/06/23  6:19 PM   Specimen: BLOOD RIGHT HAND  Result Value Ref Range Status   Specimen Description BLOOD RIGHT HAND  Final   Special Requests   Final    BOTTLES DRAWN AEROBIC ONLY Blood Culture results may not be optimal due to an inadequate volume of blood received in culture bottles   Culture   Final    NO GROWTH < 12 HOURS Performed at [facility], [address], [place], [place] [address]    Report Status PENDING  Incomplete  Aerobic Culture w Gram Stain (superficial specimen)     Status: None (Preliminary result)   Collection Time: 04/06/23  8:49 PM   Specimen: KNEE  Result Value Ref Range Status   Specimen Description KNEE  Final   Special Requests NONE  Final   Gram Stain   Final    ABUNDANT WBC PRESENT, PREDOMINANTLY PMN RARE GRAM NEGATIVE RODS    Culture   Final    NO GROWTH < 12  HOURS Performed at [facility], [address], [place], [place] [address]    Report Status PENDING  Incomplete    Deniece Ree RN, Student Nurse Practitioner

## 2023-04-07 NOTE — Progress Notes (Signed)
This patient is well known to Dr. Charlann Boxer and I, with history of right TKA on 12/15/22. We were just made aware of his hospitalization.   Superficial knee culture yielded gram negative rods. On rocephin now.  Chart review is concerning for PJI of the right knee. We will better evaluate in person, but may need to take to the OR. There is extremely limited time available in the OR at [facility] currently. NPO for now, will touch base this afternoon for more definitive planning.    Rosalene Billings, PA-C Orthopedic Surgery [facility] [place] [phone]

## 2023-04-07 NOTE — Plan of Care (Signed)
  Problem: Health Behavior/Discharge Planning: Goal: Ability to identify and utilize available resources and services will improve Outcome: Progressing Goal: Ability to manage health-related needs will improve Outcome: Progressing   Problem: Skin Integrity: Goal: Risk for impaired skin integrity will decrease Outcome: Progressing   Problem: Tissue Perfusion: Goal: Adequacy of tissue perfusion will improve Outcome: Progressing   Problem: Education: Goal: Knowledge of General Education information will improve Description: Including pain rating scale, medication(s)/side effects and non-pharmacologic comfort measures Outcome: Progressing   Problem: Clinical Measurements: Goal: Will remain free from infection Outcome: Progressing   Problem: Safety: Goal: Ability to remain free from injury will improve Outcome: Progressing

## 2023-04-07 NOTE — Progress Notes (Signed)
PT Cancellation Note  Patient Details Name: Tanner [name] MRN: [id number] DOB: 1946/02/05   Cancelled Treatment:    Reason Eval/Treat Not Completed: Medical issues which prohibited therapy. Pt likely for OR later today. Will defer until tomorrow.    Angelina Ok [facility] 04/07/2023, 2:56 PM Skip Mayer PT Acute [employer] [phone]

## 2023-04-07 NOTE — Nursing Note (Signed)
POPULATION HEALTH    COMPLEX CARE MANAGEMENT    Chronic Care Management - CCM  Status: Enrolled  Effective Dates: 12/02/2022 - present  Responsible Staff: Alden Benjamin, RN          Patient reports to be doing well. He did state that he feels he may have some seasonal depression. He states he was sleeping a lot and is interested in any services . [facility] did offer BHI Program and patient is interested in services. [facility] will message provide and prompt for BHI order to be placed. Patient did see Cardiologist and  denies CP and SOB. He still remains with aflutter and 1 degree AVB. Patient remains on Cardizem , Flecainide, and Eliquis . Patient states his diet has been on an doff some days he eats a lot others not so much . Patient has upcoming Pulmonary appointment 11/1 at 2:20pm. Patient did state he noticed a few times his hands would tremor in the past two months. His daughter thought maybe due to not eating. Patient denies any other symptoms of numbness and or headache. Patient states it has resolved at this time. [facility] asked if patient would like to move PCP appointment to a sooner date. Patient denied need at this time and will remain with his 12/23 appointment. [facility] will continue to follow patient.     Care Management Tasks Completed:  Chart review completed.  Social determinants of health reviewed and updated as needed.  Care plan reviewed/discussed/updated.  Interventions ongoing and updated as needed.  Goals addressed and updated.  Interventions ongoing and updated as needed.  Education completed.  Self management plan reviewed and updated.  See patient care coordination note.  Barriers to health, care plan, and goals reviewed.  Interventions ongoing and updated as needed.  Health maintenance/care gaps reviewed and discussed with patient.  Health maintenance/care gaps updated as applicable.  Discussed upcoming appointment dates and times.  Reinforced importance of keeping all provider appointments.  Reinforced the  importance of taking all medications as prescribed.  Medical history, surgical history, hospitalizations, and medications reviewed and updated as applicable.    Ongoing and Updated Assessment Interventions:  Encouraged BP Checks   Discussed Cardiac appointment   Reviewed upcoming Pulmonary Appointment 11/1 2:20pm  Discussed General Health  Patient expressed interest in BHI Program for counseling / seasonal depression.   Prompted PCP to place BHI Order  Spoke with [facility] Jae Dire on potential enrollment into BHI   Discussed Acute issue    Case Manager To Do List for the Next Interaction:  Monthly follow up  F/U BHI referral   F/U BP   F/U Hand tremor ( PCP ) appointment  F/U General Health    Plan to call patient in ~ one month to reassess and update plan of care.  Instructed patient to call with change in symptoms or as needed prior to next follow up.      Alden Benjamin, RN

## 2023-04-08 ENCOUNTER — Encounter (HOSPITAL_COMMUNITY): Payer: Self-pay | Admitting: Orthopedic Surgery

## 2023-04-08 ENCOUNTER — Ambulatory Visit (HOSPITAL_COMMUNITY): Payer: Self-pay

## 2023-04-08 DIAGNOSIS — L02419 Cutaneous abscess of limb, unspecified: Secondary | ICD-10-CM | POA: Diagnosis not present

## 2023-04-08 DIAGNOSIS — N170 Acute kidney failure with tubular necrosis: Secondary | ICD-10-CM | POA: Diagnosis not present

## 2023-04-08 DIAGNOSIS — R7881 Bacteremia: Secondary | ICD-10-CM | POA: Diagnosis not present

## 2023-04-08 DIAGNOSIS — I480 Paroxysmal atrial fibrillation: Secondary | ICD-10-CM | POA: Diagnosis not present

## 2023-04-08 DIAGNOSIS — F338 Other recurrent depressive disorders: Secondary | ICD-10-CM

## 2023-04-08 LAB — CBC
HCT: 25.8 % — ABNORMAL LOW (ref 39.0–52.0)
Hemoglobin: 7.8 g/dL — ABNORMAL LOW (ref 13.0–17.0)
MCH: 23.9 pg — ABNORMAL LOW (ref 26.0–34.0)
MCHC: 30.2 g/dL (ref 30.0–36.0)
MCV: 79.1 fL — ABNORMAL LOW (ref 80.0–100.0)
Platelets: 311 10*3/uL (ref 150–400)
RBC: 3.26 MIL/uL — ABNORMAL LOW (ref 4.22–5.81)
RDW: 17.6 % — ABNORMAL HIGH (ref 11.5–15.5)
WBC: 11 10*3/uL — ABNORMAL HIGH (ref 4.0–10.5)
nRBC: 0 % (ref 0.0–0.2)

## 2023-04-08 LAB — COMPREHENSIVE METABOLIC PANEL
ALT: 19 U/L (ref 0–44)
AST: 21 U/L (ref 15–41)
Albumin: 1.8 g/dL — ABNORMAL LOW (ref 3.5–5.0)
Alkaline Phosphatase: 68 U/L (ref 38–126)
Anion gap: 9 (ref 5–15)
BUN: 55 mg/dL — ABNORMAL HIGH (ref 8–23)
CO2: 23 mmol/L (ref 22–32)
Calcium: 8.3 mg/dL — ABNORMAL LOW (ref 8.9–10.3)
Chloride: 105 mmol/L (ref 98–111)
Creatinine, Ser: 4.12 mg/dL — ABNORMAL HIGH (ref 0.61–1.24)
GFR, Estimated: 14 mL/min — ABNORMAL LOW (ref >60)
Glucose, Bld: 155 mg/dL — ABNORMAL HIGH (ref 70–99)
Potassium: 4.7 mmol/L (ref 3.5–5.1)
Sodium: 137 mmol/L (ref 135–145)
Total Bilirubin: 0.4 mg/dL (ref 0.3–1.2)
Total Protein: 4.9 g/dL — ABNORMAL LOW (ref 6.5–8.1)

## 2023-04-08 LAB — BASIC METABOLIC PANEL
Anion gap: 12 (ref 5–15)
BUN: 55 mg/dL — ABNORMAL HIGH (ref 8–23)
CO2: 21 mmol/L — ABNORMAL LOW (ref 22–32)
Calcium: 8.5 mg/dL — ABNORMAL LOW (ref 8.9–10.3)
Chloride: 105 mmol/L (ref 98–111)
Creatinine, Ser: 3.97 mg/dL — ABNORMAL HIGH (ref 0.61–1.24)
GFR, Estimated: 15 mL/min — ABNORMAL LOW (ref >60)
Glucose, Bld: 134 mg/dL — ABNORMAL HIGH (ref 70–99)
Potassium: 4.8 mmol/L (ref 3.5–5.1)
Sodium: 138 mmol/L (ref 135–145)

## 2023-04-08 LAB — GLUCOSE, CAPILLARY
Glucose-Capillary: 151 mg/dL — ABNORMAL HIGH (ref 70–99)
Glucose-Capillary: 120 mg/dL — ABNORMAL HIGH (ref 70–99)
Glucose-Capillary: 145 mg/dL — ABNORMAL HIGH (ref 70–99)
Glucose-Capillary: 177 mg/dL — ABNORMAL HIGH (ref 70–99)
Glucose-Capillary: 198 mg/dL — ABNORMAL HIGH (ref 70–99)

## 2023-04-08 MED ORDER — MENTHOL 3 MG MT LOZG: 1.0000 | LOZENGE | OROMUCOSAL | Status: DC | PRN

## 2023-04-08 MED ORDER — ACETAMINOPHEN 325 MG PO TABS
650.0000 mg | ORAL_TABLET | Freq: Four times a day (QID) | ORAL | Status: AC
  Administered 2023-04-08 – 2023-04-11 (×14): 650 mg via ORAL
  Filled 2023-04-08 (×14): qty 2

## 2023-04-08 MED ORDER — APIXABAN 5 MG PO TABS
5.0000 mg | ORAL_TABLET | Freq: Two times a day (BID) | ORAL | Status: DC
  Administered 2023-04-08 – 2023-04-14 (×12): 5 mg via ORAL
  Filled 2023-04-08 (×12): qty 1

## 2023-04-08 MED ORDER — INSULIN ASPART 100 UNIT/ML IJ SOLN
0.0000 [IU] | Freq: Three times a day (TID) | INTRAMUSCULAR | Status: DC
  Administered 2023-04-08 – 2023-04-09 (×2): 1 [IU] via SUBCUTANEOUS

## 2023-04-08 MED ORDER — BISACODYL 10 MG RE SUPP: 10.0000 mg | Freq: Every day | RECTAL | Status: DC | PRN

## 2023-04-08 MED ORDER — TRANEXAMIC ACID-NACL 1000-0.7 MG/100ML-% IV SOLN
1000.0000 mg | Freq: Once | INTRAVENOUS | Status: AC
  Administered 2023-04-08: 1000 mg via INTRAVENOUS
  Filled 2023-04-08: qty 100

## 2023-04-08 MED ORDER — POLYETHYLENE GLYCOL 3350 17 G PO PACK
17.0000 g | PACK | Freq: Two times a day (BID) | ORAL | Status: DC
  Administered 2023-04-08 – 2023-04-11 (×5): 17 g via ORAL
  Filled 2023-04-08 (×8): qty 1

## 2023-04-08 MED ORDER — SENNA 8.6 MG PO TABS
2.0000 | ORAL_TABLET | Freq: Every day | ORAL | Status: DC
  Administered 2023-04-08 – 2023-04-10 (×3): 17.2 mg via ORAL
  Filled 2023-04-08 (×4): qty 2

## 2023-04-08 MED ORDER — DEXAMETHASONE SODIUM PHOSPHATE 10 MG/ML IJ SOLN
10.0000 mg | Freq: Once | INTRAMUSCULAR | Status: AC
  Administered 2023-04-08: 10 mg via INTRAVENOUS
  Filled 2023-04-08: qty 1

## 2023-04-08 MED ORDER — SODIUM CHLORIDE 0.9 % IV SOLN
10.0000 mg/kg | INTRAVENOUS | Status: DC
  Administered 2023-04-08 – 2023-04-10 (×2): 800 mg via INTRAVENOUS
  Filled 2023-04-08 (×3): qty 16

## 2023-04-08 MED ORDER — OXYCODONE HCL 5 MG PO TABS: 2.5000 mg | ORAL_TABLET | ORAL | Status: DC | PRN

## 2023-04-08 MED ORDER — INSULIN ASPART 100 UNIT/ML IJ SOLN: 0.0000 [IU] | Freq: Three times a day (TID) | INTRAMUSCULAR | Status: DC

## 2023-04-08 MED ORDER — ONDANSETRON HCL 4 MG/2ML IJ SOLN: 4.0000 mg | Freq: Four times a day (QID) | INTRAMUSCULAR | Status: DC | PRN

## 2023-04-08 MED ORDER — LACTATED RINGERS IV SOLN: INTRAVENOUS | Status: AC

## 2023-04-08 MED ORDER — PHENOL 1.4 % MT LIQD: 1.0000 | OROMUCOSAL | Status: DC | PRN

## 2023-04-08 MED ORDER — METOCLOPRAMIDE HCL 10 MG PO TABS: 5.0000 mg | ORAL_TABLET | Freq: Three times a day (TID) | ORAL | Status: DC | PRN

## 2023-04-08 MED ORDER — ONDANSETRON HCL 4 MG PO TABS: 4.0000 mg | ORAL_TABLET | Freq: Four times a day (QID) | ORAL | Status: DC | PRN

## 2023-04-08 MED ORDER — METHOCARBAMOL 1000 MG/10ML IJ SOLN: 500.0000 mg | Freq: Four times a day (QID) | INTRAMUSCULAR | Status: DC | PRN

## 2023-04-08 MED ORDER — HYDROMORPHONE HCL 1 MG/ML IJ SOLN
0.5000 mg | INTRAMUSCULAR | Status: DC | PRN
  Filled 2023-04-08: qty 1

## 2023-04-08 MED ORDER — ALUM & MAG HYDROXIDE-SIMETH 200-200-20 MG/5ML PO SUSP
30.0000 mL | ORAL | Status: DC | PRN
Start: 2023-04-08 — End: 2023-04-14

## 2023-04-08 MED ORDER — SODIUM CHLORIDE 0.9 % IV SOLN
INTRAVENOUS | Status: DC
Start: 2023-04-08 — End: 2023-04-09

## 2023-04-08 MED ORDER — METHOCARBAMOL 500 MG PO TABS
500.0000 mg | ORAL_TABLET | Freq: Four times a day (QID) | ORAL | Status: DC | PRN
  Administered 2023-04-12: 500 mg via ORAL
  Filled 2023-04-08: qty 1

## 2023-04-08 MED ORDER — METOCLOPRAMIDE HCL 5 MG/ML IJ SOLN: 5.0000 mg | Freq: Three times a day (TID) | INTRAMUSCULAR | Status: DC | PRN

## 2023-04-08 MED ORDER — OXYCODONE HCL 5 MG PO TABS: 5.0000 mg | ORAL_TABLET | ORAL | Status: DC | PRN

## 2023-04-08 MED ORDER — DIPHENHYDRAMINE HCL 12.5 MG/5ML PO ELIX: 12.5000 mg | ORAL_SOLUTION | ORAL | Status: DC | PRN

## 2023-04-08 NOTE — Plan of Care (Signed)
  Problem: Education: Goal: Ability to describe self-care measures that may prevent or decrease complications (Diabetes Survival Skills Education) will improve Outcome: Progressing Goal: Individualized Educational Video(s) Outcome: Progressing   Problem: Coping: Goal: Ability to adjust to condition or change in health will improve Outcome: Progressing   Problem: Fluid Volume: Goal: Ability to maintain a balanced intake and output will improve Outcome: Progressing   Problem: Health Behavior/Discharge Planning: Goal: Ability to identify and utilize available resources and services will improve Outcome: Progressing Goal: Ability to manage health-related needs will improve Outcome: Progressing   Problem: Metabolic: Goal: Ability to maintain appropriate glucose levels will improve Outcome: Progressing   Problem: Nutritional: Goal: Maintenance of adequate nutrition will improve Outcome: Progressing Goal: Progress toward achieving an optimal weight will improve Outcome: Progressing   Problem: Skin Integrity: Goal: Risk for impaired skin integrity will decrease Outcome: Progressing   Problem: Tissue Perfusion: Goal: Adequacy of tissue perfusion will improve Outcome: Progressing   Problem: Education: Goal: Knowledge of General Education information will improve Description: Including pain rating scale, medication(s)/side effects and non-pharmacologic comfort measures Outcome: Progressing   Problem: Health Behavior/Discharge Planning: Goal: Ability to manage health-related needs will improve Outcome: Progressing   Problem: Clinical Measurements: Goal: Ability to maintain clinical measurements within normal limits will improve Outcome: Progressing Goal: Will remain free from infection Outcome: Progressing Goal: Diagnostic test results will improve Outcome: Progressing Goal: Respiratory complications will improve Outcome: Progressing Goal: Cardiovascular complication will  be avoided Outcome: Progressing   Problem: Activity: Goal: Risk for activity intolerance will decrease Outcome: Progressing   Problem: Nutrition: Goal: Adequate nutrition will be maintained Outcome: Progressing   Problem: Coping: Goal: Level of anxiety will decrease Outcome: Progressing   Problem: Elimination: Goal: Will not experience complications related to bowel motility Outcome: Progressing Goal: Will not experience complications related to urinary retention Outcome: Progressing   Problem: Pain Management: Goal: General experience of comfort will improve Outcome: Progressing   Problem: Safety: Goal: Ability to remain free from injury will improve Outcome: Progressing   Problem: Skin Integrity: Goal: Risk for impaired skin integrity will decrease Outcome: Progressing   Problem: Education: Goal: Knowledge of the prescribed therapeutic regimen will improve Outcome: Progressing Goal: Individualized Educational Video(s) Outcome: Progressing   Problem: Activity: Goal: Ability to avoid complications of mobility impairment will improve Outcome: Progressing Goal: Range of joint motion will improve Outcome: Progressing   Problem: Clinical Measurements: Goal: Postoperative complications will be avoided or minimized Outcome: Progressing   Problem: Pain Management: Goal: Pain level will decrease with appropriate interventions Outcome: Progressing   Problem: Skin Integrity: Goal: Will show signs of wound healing Outcome: Progressing

## 2023-04-08 NOTE — Progress Notes (Signed)
Subjective: 1 Day Post-Op Procedure(s) (LRB): IRRIGATION AND DEBRIDEMENT KNEE WITH POLY EXCHANGE (Right) Patient reports pain as mild.   Patient seen in rounds for Dr. Charlann Boxer. Patient is well, and has had no acute complaints or problems. No acute events overnight. He reports minimal pain in the knee at the moment. We discussed his intra-op findings and expected treatment.  We will start therapy today.   Objective: Vital signs in last 24 hours: Temp:  [97.6 F (36.4 C)-98.5 F (36.9 C)] 97.6 F (36.4 C) (10/24 0535) Pulse Rate:  [58-73] 58 (10/24 0535) Resp:  [12-19] 12 (10/24 0535) BP: (101-157)/(43-125) 127/54 (10/24 0535) SpO2:  [87 %-100 %] 100 % (10/24 0535) Weight:  [100 kg] 100 kg (10/24 0500)  Intake/Output from previous day:  Intake/Output Summary (Last 24 hours) at 04/08/2023 0743 Last data filed at 04/07/2023 1855 Gross per 24 hour  Intake 250 ml  Output 350 ml  Net -100 ml     Intake/Output this shift: No intake/output data recorded.  Labs: Recent Labs    04/06/23 1351 04/06/23 1400 04/07/23 0006 04/08/23 0609  HGB 10.0* 10.3* 8.6* 7.8*   Recent Labs    04/07/23 0006 04/08/23 0609  WBC 14.4* 11.0*  RBC 3.62* 3.26*  HCT 28.9* 25.8*  PLT 306 311   Recent Labs    04/07/23 0006 04/08/23 0609  NA 135 137  K 3.9 4.7  CL 100 105  CO2 21* 23  BUN 54* 55*  CREATININE 4.64* 4.12*  GLUCOSE 89 155*  CALCIUM 8.1* 8.3*   No results for input(s): "LABPT", "INR" in the last 72 hours.  Exam: General - Patient is Alert and Oriented Extremity - Neurologically intact Sensation intact distally Intact pulses distally Dorsiflexion/Plantar flexion intact Dressing - dressing C/D/I Motor Function - intact, moving foot and toes well on exam.   Past Medical History:  Diagnosis Date   Acute kidney failure (HCC)    Anxiety    Arthritis    Back pain    BPH (benign prostatic hypertrophy)    CHF (congestive heart failure) (HCC)    Clostridium difficile  infection    Clotting disorder (HCC)    Depression    Diabetes (HCC) 12/11/2016   type 2    DVT (deep venous thrombosis) (HCC)    DVT of deep femoral vein, right (HCC) 06/29/2018   Edema, lower extremity    High cholesterol    Hypertension    Joint pain    Low back pain potentially associated with spinal stenosis    Neuromuscular disorder (HCC)    Neuropathy of lower extremity    bilateral   OSA (obstructive sleep apnea)    pt denies    Osteoarthritis    PE (pulmonary embolism)    Pneumonia    hx of x 2    Ventral hernia     Assessment/Plan: 1 Day Post-Op Procedure(s) (LRB): IRRIGATION AND DEBRIDEMENT KNEE WITH POLY EXCHANGE (Right) Principal Problem:   Infection of prosthetic right knee joint (HCC) Active Problems:   Essential hypertension   Acute renal failure superimposed on stage 3b chronic kidney disease (HCC)   DM2 (diabetes mellitus, type 2) (HCC)   Chronic systolic CHF (congestive heart failure) (HCC)   Paroxysmal atrial fibrillation (HCC)   Generalized weakness   Fall at home, initial encounter   Acute on chronic anemia   Hyperlipidemia   MRSA bacteremia   Enterococcal bacteremia   Streptococcal bacteremia  Estimated body mass index is 37.84 kg/m as calculated  from the following:   Height as of this encounter: 5\' 4"  (1.626 m).   Weight as of this encounter: 100 kg. Advance diet Up with therapy  Intra-op, deep cultures pending  Likely PICC line for home therapy once cleared from a blood culture standpoint - per ID recommendations  Hgb stable at 7.8 today  Up with PT as able  ACE wrap on his leg may be removed at any point if uncomfortable Waterproof aquacel underneath     Rosalene Billings, PA-C Orthopedic Surgery [phone] 04/08/2023, 7:43 AM

## 2023-04-08 NOTE — Evaluation (Signed)
Occupational Therapy Evaluation Patient Details Name: Tanner [name] MRN: [id number] DOB: 02/15/1946 Today's Date: 04/08/2023   History of Present Illness 77 y.o. male  admitted to [facility] on 04/06/2023 with cellulitis of the right lower extremity and generalized weakness. +fall, CT head and spine negative, 04/07/23 I&D with poly exchange Rt knee PMH includes R TKR 12/15/2022, L TKR 2022, anxiety, BPH, HF, depression, DMII, DVT on anticoagulation, HTN, PE, afib, CKDIII.   Clinical Impression   Pt admitted for above, needs Max A for LB ADLs at this time but CGA to setup assist for other. Pt was able to stand for 2-3 mins for BP check then BLEs gave out after taking 2 steps, he also has a hx of falls. Discussed with pt that he would not be safe to DC home at this level but he really does not want SNF. Pt would benefit from continued acute skilled OT services to address deficits and help transition to next level of care. Patient would benefit from post acute skilled rehab facility with <3 hours of therapy and 24/7 support, can reconsider potential for [facility] once he progresses.      If plan is discharge home, recommend the following: A lot of help with bathing/dressing/bathroom;Assistance with cooking/housework;A lot of help with walking and/or transfers;Help with stairs or ramp for entrance    Functional Status Assessment  Patient has had a recent decline in their functional status and demonstrates the ability to make significant improvements in function in a reasonable and predictable amount of time.  Equipment Recommendations  None recommended by OT (Pt has rec DME, suggest his ramp gets built at home)    Recommendations for Other Services       Precautions / Restrictions Precautions Precautions: Fall Precaution Comments: multiple trip/falls Restrictions Weight Bearing Restrictions: No      Mobility Bed Mobility               General bed mobility comments: pt up in  recliner on arrival    Transfers Overall transfer level: Needs assistance Equipment used: Rolling walker (2 wheels) Transfers: Sit to/from Stand Sit to Stand: Min assist           General transfer comment: STS x2 more of a light mod A from recliner, after standing 2 mins to retrieve BP attempted to take steps. Pt BLE gave out and was heading to the floor before OT intervention. Significant Total A to lift pt into recliner and assist from staff to reposition chair      Balance Overall balance assessment: Needs assistance Sitting-balance support: No upper extremity supported, Feet supported Sitting balance-Leahy Scale: Fair Sitting balance - Comments: in recliner, not fully assessed   Standing balance support: Bilateral upper extremity supported, During functional activity Standing balance-Leahy Scale: Poor Standing balance comment: reliant on RW                           ADL either performed or assessed with clinical judgement   ADL Overall ADL's : Needs assistance/impaired Eating/Feeding: Independent;Sitting   Grooming: Sitting;Set up   Upper Body Bathing: Sitting;Set up   Lower Body Bathing: Maximal assistance;Sitting/lateral leans   Upper Body Dressing : Sitting;Set up   Lower Body Dressing: Maximal assistance;Sit to/from stand;Sitting/lateral leans Lower Body Dressing Details (indicate cue type and reason): does not wear socks at baseline, slip on shoes Toilet Transfer: Moderate assistance;+2 for safety/equipment;Rolling walker (2 wheels);Stand-pivot Statistician Details (indicate cue type and  reason): based on sts from recliner Toileting- Clothing Manipulation and Hygiene: Maximal assistance;Sit to/from stand               Vision         Perception         Praxis         Pertinent Vitals/Pain Pain Assessment Pain Assessment: Faces Faces Pain Scale: Hurts a little bit Pain Location: BLEs Pain Intervention(s): Limited activity within  patient's tolerance, Monitored during session     Extremity/Trunk Assessment Upper Extremity Assessment Upper Extremity Assessment: Overall WFL for tasks assessed   Lower Extremity Assessment RLE Deficits / Details: bulky bandage post-op; ~0-90 degrees knee flexion; at least 3+; ++edema RLE Sensation: WNL LLE Deficits / Details: AROM WFL; strength grossly 4/5; bandage to left shin (reports he fell going up his steps) LLE Sensation: WNL   Cervical / Trunk Assessment Cervical / Trunk Assessment: Kyphotic   Communication Communication Communication: No apparent difficulties Cueing Techniques: Verbal cues   Cognition Arousal: Alert Behavior During Therapy: WFL for tasks assessed/performed Overall Cognitive Status: Within Functional Limits for tasks assessed                                 General Comments: Really dislikes [employer]     General Comments  VSS on RA, bp 127/46(68) at rest. not able to obtain accurate standing Bp due to pt needing significant reliance on RW to stand    Exercises     Shoulder Instructions      Home Living Family/patient expects to be discharged to:: Private residence Living Arrangements: Alone Available Help at Discharge: Family;Available PRN/intermittently (2 sons both local) Type of Home: House Home Access: Stairs to enter [employer] of Steps: 5 Entrance Stairs-Rails: Right;Left;Can reach both Home Layout: One level     Bathroom Shower/Tub: Producer, television/film/video: Handicapped height     Home Equipment: Agricultural consultant (2 wheels);Cane - single point;Grab bars - tub/shower   Additional Comments: sleeps in recliner, normally has to walk about 89ft to get to bathroom. Reports about 6 falls in the last year d/t losing balance or tripping.      Prior Functioning/Environment Prior Level of Function : Independent/Modified Independent;Driving;History of Falls (last six months)             Mobility  Comments: endorses falls (5 or 6 since July) usually shoes catch the floor and trips/falls, uses cane; sleeps in a recliner ADLs Comments: independent, stands to shower, has a housekeeper for heavy cleaning        OT Problem List: Decreased strength;Decreased activity tolerance;Impaired balance (sitting and/or standing);Obesity      OT Treatment/Interventions: Self-care/ADL training;Therapeutic activities;Patient/family education;Balance training;Therapeutic exercise    OT Goals(Current goals can be found in the care plan section) Acute Rehab OT Goals Patient Stated Goal: To go home OT Goal Formulation: With patient Time For Goal Achievement: 04/22/23 Potential to Achieve Goals: Good ADL Goals Pt Will Perform Grooming: standing;with contact guard assist Pt Will Perform Lower Body Bathing: sitting/lateral leans;with contact guard assist;with set-up Pt Will Transfer to Toilet: with contact guard assist;stand pivot transfer;bedside commode Pt Will Perform Toileting - Clothing Manipulation and hygiene: with contact guard assist;sit to/from stand Pt Will Perform Tub/Shower Transfer: Shower transfer;shower seat;with contact guard assist  OT Frequency: Min 1X/week    Co-evaluation              AM-PAC OT "  6 Clicks" Daily Activity     Outcome Measure Help from another person eating meals?: None Help from another person taking care of personal grooming?: A Little Help from another person toileting, which includes using toliet, bedpan, or urinal?: A Lot Help from another person bathing (including washing, rinsing, drying)?: A Lot Help from another person to put on and taking off regular upper body clothing?: A Little Help from another person to put on and taking off regular lower body clothing?: A Lot 6 Click Score: 16   End of Session Equipment Utilized During Treatment: Gait belt;Rolling walker (2 wheels) Nurse Communication: Mobility status (Rec +2 pivot assist)  Activity  Tolerance: Patient tolerated treatment well Patient left: with call bell/phone within reach;in chair;with chair alarm set  OT Visit Diagnosis: Other abnormalities of gait and mobility (R26.89);Unsteadiness on feet (R26.81)                Time: 0981-1914 OT Time Calculation (min): 29 min Charges:  OT General Charges $OT Visit: 1 Visit OT Evaluation $OT Eval Moderate Complexity: 1 Mod OT Treatments $Therapeutic Activity: 8-22 mins  04/08/2023  AB, OTR/L  Acute Rehabilitation Services  Office: [phone]   Tristan Schroeder 04/08/2023, 2:52 PM

## 2023-04-08 NOTE — Evaluation (Addendum)
Physical Therapy Evaluation Patient Details Name: Tanner [name] MRN: [id number] DOB: [birth year] Today's Date: 04/08/2023  History of Present Illness  77 y.o. male  admitted to [facility] on 04/06/2023 with cellulitis of the right lower extremity and generalized weakness. +fall, CT head and spine negative, 04/07/23 I&D with poly exchange Rt knee PMH includes R TKR 12/15/2022, L TKR 2022, anxiety, BPH, HF, depression, DMII, DVT on anticoagulation, HTN, PE, afib, CKDIII.  Clinical Impression   Patient is s/p above surgery resulting in functional limitations due to the deficits listed below (see PT Problem List). Patient lives alone in one level home with 5 steps to enter. He has had 5 falls since July 2024 surgery. He currently requires min assist for bed mobility and transfers. He became dizzy with standing and deferred ambulation (unable to locate BP cuff to assess BP in standing--once seated for several minutes 141/66). On room air sats >97% during activity. Patient adamantly refuses SNF (has been previously). Recommend HHPT however pt is not yet at a level where he can manage home alone.  Patient will benefit from acute skilled PT to increase their independence and safety with mobility to facilitate discharge.         If plan is discharge home, recommend the following: A little help with walking and/or transfers;Assistance with cooking/housework;Direct supervision/assist for medications management;Direct supervision/assist for financial management;Assist for transportation;Help with stairs or ramp for entrance   Can travel by private vehicle        Equipment Recommendations None recommended by PT  Recommendations for Other Services  OT consult    Functional Status Assessment Patient has had a recent decline in their functional status and demonstrates the ability to make significant improvements in function in a reasonable and predictable amount of time.     Precautions /  Restrictions Precautions Precautions: Fall Precaution Comments: multiple trip/falls Restrictions Weight Bearing Restrictions: No      Mobility  Bed Mobility Overal bed mobility: Needs Assistance Bed Mobility: Supine to Sit     Supine to sit: Min assist, HOB elevated     General bed mobility comments: to R EOB    Transfers Overall transfer level: Needs assistance Equipment used: Rolling walker (2 wheels) Transfers: Sit to/from Stand, Bed to chair/wheelchair/BSC Sit to Stand: Min assist   Step pivot transfers: Min assist       General transfer comment: +lightheaded; unable to find BP cuff to get standing BP and progressed to chair for safety    Ambulation/Gait               General Gait Details: deferred due to dizziness and ?BP  Stairs            Wheelchair Mobility     Tilt Bed    Modified Rankin (Stroke Patients Only)       Balance Overall balance assessment: Needs assistance Sitting-balance support: No upper extremity supported, Feet supported Sitting balance-Leahy Scale: Fair Sitting balance - Comments: EOB   Standing balance support: Bilateral upper extremity supported, During functional activity Standing balance-Leahy Scale: Poor Standing balance comment: reliant on RW                             Pertinent Vitals/Pain Pain Assessment Pain Assessment: 0-10 Pain Score: 0-No pain    Home Living Family/patient expects to be discharged to:: Private residence Living Arrangements: Alone Available Help at Discharge: Family;Available PRN/intermittently (2 sons, both local) Type of  Home: House Home Access: Stairs to enter Entrance Stairs-Rails: Right;Left;Can reach both Entrance Stairs-Number of Steps: 5   Home Layout: One level Home Equipment: Agricultural consultant (2 wheels);Cane - single point;Grab bars - tub/shower      Prior Function Prior Level of Function : Independent/Modified Independent;Driving              Mobility Comments: endorses falls (5 or 6 since July) usually shoes catch the floor and trips/falls, uses cane; sleeps in a recliner ADLs Comments: independent, stands to shower, has a housekeeper for heavy cleaning     Extremity/Trunk Assessment   Upper Extremity Assessment Upper Extremity Assessment: Defer to OT evaluation    Lower Extremity Assessment Lower Extremity Assessment: RLE deficits/detail;LLE deficits/detail RLE Deficits / Details: bulky bandage post-op; ~0-90 degrees knee flexion; at least 3+; ++edema RLE Sensation: WNL LLE Deficits / Details: AROM WFL; strength grossly 4/5; bandage to left shin (reports he fell going up his steps) LLE Sensation: WNL    Cervical / Trunk Assessment Cervical / Trunk Assessment: Kyphotic  Communication   Communication Communication: No apparent difficulties  Cognition Arousal: Lethargic Behavior During Therapy: WFL for tasks assessed/performed Overall Cognitive Status: No family/caregiver present to determine baseline cognitive functioning                       Memory: Decreased short-term memory Following Commands: Follows one step commands with increased time Safety/Judgement: Decreased awareness of deficits, Decreased awareness of safety   Problem Solving: Decreased initiation, Slow processing, Requires verbal cues General Comments: Asked if he went to rehab after last surgery ("where you had a room and stayed overnight") and pt reported no he went straight home, then later mentioned "when I was at the nursing home..."        General Comments General comments (skin integrity, edema, etc.): On 2L sats 100% on arrival; on RA sats 97% with activity    Exercises General Exercises - Lower Extremity Ankle Circles/Pumps: AROM, Both, Supine, 10 reps Quad Sets: AROM, Both, 5 reps Heel Slides: AROM, Both, 5 reps   Assessment/Plan    PT Assessment Patient needs continued PT services  PT Problem List Decreased  strength;Decreased mobility;Decreased safety awareness;Decreased range of motion;Decreased activity tolerance;Decreased balance;Decreased knowledge of use of DME;Pain;Cardiopulmonary status limiting activity;Decreased knowledge of precautions;Decreased cognition;Decreased coordination;Obesity       PT Treatment Interventions DME instruction;Therapeutic activities;Gait training;Therapeutic exercise;Patient/family education;Balance training;Stair training;Functional mobility training;Cognitive remediation    PT Goals (Current goals can be found in the Care Plan section)  Acute Rehab PT Goals Patient Stated Goal: to feel stronger PT Goal Formulation: With patient Time For Goal Achievement: 04/22/23 Potential to Achieve Goals: Fair    Frequency Min 1X/week     Co-evaluation               AM-PAC PT "6 Clicks" Mobility  Outcome Measure Help needed turning from your back to your side while in a flat bed without using bedrails?: A Little Help needed moving from lying on your back to sitting on the side of a flat bed without using bedrails?: A Little Help needed moving to and from a bed to a chair (including a wheelchair)?: A Little   Help needed to walk in hospital room?: Total Help needed climbing 3-5 steps with a railing? : Total 6 Click Score: 11    End of Session Equipment Utilized During Treatment: Gait belt Activity Tolerance: Treatment limited secondary to medical complications (Comment) (dizzy) Patient left: in chair;with  call bell/phone within reach;with chair alarm set Nurse Communication: Mobility status (dizzy with standing; on room air) PT Visit Diagnosis: Other abnormalities of gait and mobility (R26.89);Muscle weakness (generalized) (M62.81);Repeated falls (R29.6)    Time: 1610-9604 PT Time Calculation (min) (ACUTE ONLY): 33 min   Charges:   PT Evaluation $PT Eval Low Complexity: 1 Low PT Treatments $Therapeutic Activity: 8-22 mins PT General Charges $$  ACUTE PT VISIT: 1 Visit          Jerolyn Center, PT Acute Rehabilitation Services  Office [phone]   Zena Amos 04/08/2023, 10:30 AM

## 2023-04-08 NOTE — Progress Notes (Signed)
PROGRESS NOTE        PATIENT DETAILS Name: Tanner [name] Age: 77 y.o. Sex: male Date of Birth: December 12, 1945 Admit Date: 04/06/2023 Admitting Physician Angie Fava, DO XBM:[id number], [facility]  Brief Summary: Patient is a 77 y.o.  male chronic HFrEF, DM-2, HTN, PAF on Eliquis-sent right knee arthroplasty 12/15/2022-presented with generalized weakness/ground-level fall-along with right knee discomfort for the past several days-upon further evaluation-he was found to have AKI with polymicrobial bacteremia-likely secondary to periprosthetic right knee infection (gram-negative rod on synovial fluid culture)  Significant events: 10/22>> admit to [facility]-I&D of right knee abscess by ED physician.  Significant studies: 3/6>> echo: EF 45-50% 10/23>> CXR: No PNA 10/23>> CT head: No acute intracranial abnormality 10/23>> CT C-spine: No fracture 10/23>> CT lumbar spine: No fracture 10/23>> x-ray right knee: Right knee arthroplasty without complication-joint effusion with subcutaneous edema anteriorly. 10/23>> renal ultrasound: No hydronephrosis. 1023>> echocardiogram: EF 60-65%, no obvious vegetations identified.  Significant microbiology data: 10/22>> blood culture: Gram-positive cocci in pairs/chains (BCID-Enterococcus, Staphylococcus, Streptococcus) 10/22>> superficial knee culture: Gram-negative rod.  Procedures: 10/22>> superficial right knee abscess I&D by ED physician 10/23>> excisional/nonexcisional debridement of right knee, polyethylene revision-Dr. Constance Goltz.  Consults: Infectious disease Orthopedics  Subjective: Lying comfortably in bed-pain at the right knee site.  Objective: Vitals: Blood pressure 120/66, pulse 72, temperature 97.7 F (36.5 C), temperature source Oral, resp. rate 16, height 5\' 4"  (1.626 m), weight 100 kg, SpO2 94%.   Exam: Gen Exam:Alert awake-not in any distress HEENT:atraumatic, normocephalic Chest: B/L clear to  auscultation anteriorly CVS:S1S2 regular Abdomen:soft non tender, non distended Extremities:no edema-right knee wrapped, left leg in dressing. Neurology: Non focal Skin: no rash  Pertinent Labs/Radiology:    Latest Ref Rng & Units 04/08/2023    6:09 AM 04/07/2023   12:06 AM 04/06/2023    2:00 PM  CBC  WBC 4.0 - 10.5 K/uL 11.0  14.4  20.8   Hemoglobin 13.0 - 17.0 g/dL 7.8  8.6  36.6   Hematocrit 39.0 - 52.0 % 25.8  28.9  34.3   Platelets 150 - 400 K/uL 311  306  389     Lab Results  Component Value Date   NA 137 04/08/2023   K 4.7 04/08/2023   CL 105 04/08/2023   CO2 23 04/08/2023      Assessment/Plan: AKI on CKD stage IIIb AKI likely hemodynamically mediated in the setting of bacteremia UA negative for proteinuria Renal ultrasound without hydronephrosis Renal function gradually improving with supportive care Avoid nephrotoxic agents Follow creatinine.  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  Normocytic anemia Due to acute illness/AKI-no indication of GI loss Follow CBC periodically  DM-2 CBG stable SSI  Recent Labs    04/08/23 0529 04/08/23 0911 04/08/23 1148  GLUCAP 151* 120* 145*    HTN BP stable Resume Coreg over the next several days  Obesity: Estimated body mass index is 37.84 kg/m as calculated from the following:   Height as of this encounter: 5\' 4"  (1.626  PROGRESS NOTE        PATIENT DETAILS Name: Tanner [name] Age: 77 y.o. Sex: male Date of Birth: December 12, 1945 Admit Date: 04/06/2023 Admitting Physician Angie Fava, DO XBM:[id number], [facility]  Brief Summary: Patient is a 77 y.o.  male chronic HFrEF, DM-2, HTN, PAF on Eliquis-sent right knee arthroplasty 12/15/2022-presented with generalized weakness/ground-level fall-along with right knee discomfort for the past several days-upon further evaluation-he was found to have AKI with polymicrobial bacteremia-likely secondary to periprosthetic right knee infection (gram-negative rod on synovial fluid culture)  Significant events: 10/22>> admit to [facility]-I&D of right knee abscess by ED physician.  Significant studies: 3/6>> echo: EF 45-50% 10/23>> CXR: No PNA 10/23>> CT head: No acute intracranial abnormality 10/23>> CT C-spine: No fracture 10/23>> CT lumbar spine: No fracture 10/23>> x-ray right knee: Right knee arthroplasty without complication-joint effusion with subcutaneous edema anteriorly. 10/23>> renal ultrasound: No hydronephrosis. 1023>> echocardiogram: EF 60-65%, no obvious vegetations identified.  Significant microbiology data: 10/22>> blood culture: Gram-positive cocci in pairs/chains (BCID-Enterococcus, Staphylococcus, Streptococcus) 10/22>> superficial knee culture: Gram-negative rod.  Procedures: 10/22>> superficial right knee abscess I&D by ED physician 10/23>> excisional/nonexcisional debridement of right knee, polyethylene revision-Dr. Constance Goltz.  Consults: Infectious disease Orthopedics  Subjective: Lying comfortably in bed-pain at the right knee site.  Objective: Vitals: Blood pressure 120/66, pulse 72, temperature 97.7 F (36.5 C), temperature source Oral, resp. rate 16, height 5\' 4"  (1.626 m), weight 100 kg, SpO2 94%.   Exam: Gen Exam:Alert awake-not in any distress HEENT:atraumatic, normocephalic Chest: B/L clear to  auscultation anteriorly CVS:S1S2 regular Abdomen:soft non tender, non distended Extremities:no edema-right knee wrapped, left leg in dressing. Neurology: Non focal Skin: no rash  Pertinent Labs/Radiology:    Latest Ref Rng & Units 04/08/2023    6:09 AM 04/07/2023   12:06 AM 04/06/2023    2:00 PM  CBC  WBC 4.0 - 10.5 K/uL 11.0  14.4  20.8   Hemoglobin 13.0 - 17.0 g/dL 7.8  8.6  36.6   Hematocrit 39.0 - 52.0 % 25.8  28.9  34.3   Platelets 150 - 400 K/uL 311  306  389     Lab Results  Component Value Date   NA 137 04/08/2023   K 4.7 04/08/2023   CL 105 04/08/2023   CO2 23 04/08/2023      Assessment/Plan: AKI on CKD stage IIIb AKI likely hemodynamically mediated in the setting of bacteremia UA negative for proteinuria Renal ultrasound without hydronephrosis Renal function gradually improving with supportive care Avoid nephrotoxic agents Follow creatinine.  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  Normocytic anemia Due to acute illness/AKI-no indication of GI loss Follow CBC periodically  DM-2 CBG stable SSI  Recent Labs    04/08/23 0529 04/08/23 0911 04/08/23 1148  GLUCAP 151* 120* 145*    HTN BP stable Resume Coreg over the next several days  Obesity: Estimated body mass index is 37.84 kg/m as calculated from the following:   Height as of this encounter: 5\' 4"  (1.626  PROGRESS NOTE        PATIENT DETAILS Name: Tanner [name] Age: 77 y.o. Sex: male Date of Birth: December 12, 1945 Admit Date: 04/06/2023 Admitting Physician Angie Fava, DO XBM:[id number], [facility]  Brief Summary: Patient is a 77 y.o.  male chronic HFrEF, DM-2, HTN, PAF on Eliquis-sent right knee arthroplasty 12/15/2022-presented with generalized weakness/ground-level fall-along with right knee discomfort for the past several days-upon further evaluation-he was found to have AKI with polymicrobial bacteremia-likely secondary to periprosthetic right knee infection (gram-negative rod on synovial fluid culture)  Significant events: 10/22>> admit to [facility]-I&D of right knee abscess by ED physician.  Significant studies: 3/6>> echo: EF 45-50% 10/23>> CXR: No PNA 10/23>> CT head: No acute intracranial abnormality 10/23>> CT C-spine: No fracture 10/23>> CT lumbar spine: No fracture 10/23>> x-ray right knee: Right knee arthroplasty without complication-joint effusion with subcutaneous edema anteriorly. 10/23>> renal ultrasound: No hydronephrosis. 1023>> echocardiogram: EF 60-65%, no obvious vegetations identified.  Significant microbiology data: 10/22>> blood culture: Gram-positive cocci in pairs/chains (BCID-Enterococcus, Staphylococcus, Streptococcus) 10/22>> superficial knee culture: Gram-negative rod.  Procedures: 10/22>> superficial right knee abscess I&D by ED physician 10/23>> excisional/nonexcisional debridement of right knee, polyethylene revision-Dr. Constance Goltz.  Consults: Infectious disease Orthopedics  Subjective: Lying comfortably in bed-pain at the right knee site.  Objective: Vitals: Blood pressure 120/66, pulse 72, temperature 97.7 F (36.5 C), temperature source Oral, resp. rate 16, height 5\' 4"  (1.626 m), weight 100 kg, SpO2 94%.   Exam: Gen Exam:Alert awake-not in any distress HEENT:atraumatic, normocephalic Chest: B/L clear to  auscultation anteriorly CVS:S1S2 regular Abdomen:soft non tender, non distended Extremities:no edema-right knee wrapped, left leg in dressing. Neurology: Non focal Skin: no rash  Pertinent Labs/Radiology:    Latest Ref Rng & Units 04/08/2023    6:09 AM 04/07/2023   12:06 AM 04/06/2023    2:00 PM  CBC  WBC 4.0 - 10.5 K/uL 11.0  14.4  20.8   Hemoglobin 13.0 - 17.0 g/dL 7.8  8.6  36.6   Hematocrit 39.0 - 52.0 % 25.8  28.9  34.3   Platelets 150 - 400 K/uL 311  306  389     Lab Results  Component Value Date   NA 137 04/08/2023   K 4.7 04/08/2023   CL 105 04/08/2023   CO2 23 04/08/2023      Assessment/Plan: AKI on CKD stage IIIb AKI likely hemodynamically mediated in the setting of bacteremia UA negative for proteinuria Renal ultrasound without hydronephrosis Renal function gradually improving with supportive care Avoid nephrotoxic agents Follow creatinine.  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  Normocytic anemia Due to acute illness/AKI-no indication of GI loss Follow CBC periodically  DM-2 CBG stable SSI  Recent Labs    04/08/23 0529 04/08/23 0911 04/08/23 1148  GLUCAP 151* 120* 145*    HTN BP stable Resume Coreg over the next several days  Obesity: Estimated body mass index is 37.84 kg/m as calculated from the following:   Height as of this encounter: 5\' 4"  (1.626  Status PENDING  Incomplete  Aerobic Culture w Gram Stain (superficial specimen)     Status: None  (Preliminary result)   Collection Time: 04/06/23  8:49 PM   Specimen: KNEE  Result Value Ref Range Status   Specimen Description KNEE  Final   Special Requests NONE  Final   Gram Stain   Final    ABUNDANT WBC PRESENT, PREDOMINANTLY PMN RARE GRAM NEGATIVE RODS    Culture   Final    NO GROWTH < 12 HOURS Performed at [facility], [address] [place], [place] [address]    Report Status PENDING  Incomplete  Aerobic/Anaerobic Culture w Gram Stain (surgical/deep wound)     Status: None (Preliminary result)   Collection Time: 04/07/23  6:09 PM   Specimen: Wound; Tissue  Result Value Ref Range Status   Specimen Description TISSUE RIGHT KNEE  Final   Special Requests NONE  Final   Gram Stain   Final    MODERATE WBC PRESENT, PREDOMINANTLY PMN NO ORGANISMS SEEN Performed at [facility], [address] [place], [place] [address]    Culture PENDING  Incomplete   Report Status PENDING  Incomplete    RADIOLOGY STUDIES/RESULTS: US RENAL  Result Date: 04/08/2023 CLINICAL DATA:  Acute renal injury EXAM: RENAL / URINARY TRACT ULTRASOUND COMPLETE COMPARISON:  CT from 12/19/2013 FINDINGS: Right Kidney: Renal measurements: 9.0 x 4.0 x 4.5 cm. = volume: 83.4 mL. Renal cysts are noted in the lower pole of the right kidney measuring up to 3.8 cm. These are simple in nature. No follow-up is recommended. Left Kidney: Renal measurements: 10.6 x 4.3 x 5.2 cm. = volume: 125.2 mL. Renal cysts are noted in the lower pole of the left kidney. The largest of these measures 6.8 cm. These are also simple in nature. No further follow-up is recommended. Bladder: Appears normal for degree of bladder distention. Other: None. IMPRESSION: Bilateral simple cysts.  No other focal abnormality is noted. Electronically Signed   By: Alcide Clever M.D.   On: 04/08/2023 01:36   ECHOCARDIOGRAM COMPLETE  Result Date: 04/07/2023    ECHOCARDIOGRAM REPORT   Patient Name:   Tanner [name] Date of Exam: 04/07/2023  Medical Rec #:  [id number]      Height:       64.0 in Accession #:    [id number]     Weight:       222.2 lb Date of Birth:  January 15, 1946      BSA:          2.046 m Patient Age:    38 years       BP:           157/125 mmHg Patient Gender: M              HR:           73 bpm. Exam Location:  Inpatient Procedure: 2D Echo, Cardiac Doppler, Color Doppler and Intracardiac            Opacification Agent Indications:    Bacteremia  History:        Patient has prior history of Echocardiogram examinations, most                 recent 08/19/2022. Risk Factors:Hypertension.  Sonographer:    [facility] Referring Phys: [id number] [facility]  Sonographer Comments: Suboptimal apical window and patient is obese. IMPRESSIONS  1. Left ventricular ejection fraction, by estimation, is 60 to 65%. The left  Status PENDING  Incomplete  Aerobic Culture w Gram Stain (superficial specimen)     Status: None  (Preliminary result)   Collection Time: 04/06/23  8:49 PM   Specimen: KNEE  Result Value Ref Range Status   Specimen Description KNEE  Final   Special Requests NONE  Final   Gram Stain   Final    ABUNDANT WBC PRESENT, PREDOMINANTLY PMN RARE GRAM NEGATIVE RODS    Culture   Final    NO GROWTH < 12 HOURS Performed at [facility], [address] [place], [place] [address]    Report Status PENDING  Incomplete  Aerobic/Anaerobic Culture w Gram Stain (surgical/deep wound)     Status: None (Preliminary result)   Collection Time: 04/07/23  6:09 PM   Specimen: Wound; Tissue  Result Value Ref Range Status   Specimen Description TISSUE RIGHT KNEE  Final   Special Requests NONE  Final   Gram Stain   Final    MODERATE WBC PRESENT, PREDOMINANTLY PMN NO ORGANISMS SEEN Performed at [facility], [address] [place], [place] [address]    Culture PENDING  Incomplete   Report Status PENDING  Incomplete    RADIOLOGY STUDIES/RESULTS: US RENAL  Result Date: 04/08/2023 CLINICAL DATA:  Acute renal injury EXAM: RENAL / URINARY TRACT ULTRASOUND COMPLETE COMPARISON:  CT from 12/19/2013 FINDINGS: Right Kidney: Renal measurements: 9.0 x 4.0 x 4.5 cm. = volume: 83.4 mL. Renal cysts are noted in the lower pole of the right kidney measuring up to 3.8 cm. These are simple in nature. No follow-up is recommended. Left Kidney: Renal measurements: 10.6 x 4.3 x 5.2 cm. = volume: 125.2 mL. Renal cysts are noted in the lower pole of the left kidney. The largest of these measures 6.8 cm. These are also simple in nature. No further follow-up is recommended. Bladder: Appears normal for degree of bladder distention. Other: None. IMPRESSION: Bilateral simple cysts.  No other focal abnormality is noted. Electronically Signed   By: Alcide Clever M.D.   On: 04/08/2023 01:36   ECHOCARDIOGRAM COMPLETE  Result Date: 04/07/2023    ECHOCARDIOGRAM REPORT   Patient Name:   Tanner [name] Date of Exam: 04/07/2023  Medical Rec #:  [id number]      Height:       64.0 in Accession #:    [id number]     Weight:       222.2 lb Date of Birth:  January 15, 1946      BSA:          2.046 m Patient Age:    38 years       BP:           157/125 mmHg Patient Gender: M              HR:           73 bpm. Exam Location:  Inpatient Procedure: 2D Echo, Cardiac Doppler, Color Doppler and Intracardiac            Opacification Agent Indications:    Bacteremia  History:        Patient has prior history of Echocardiogram examinations, most                 recent 08/19/2022. Risk Factors:Hypertension.  Sonographer:    [facility] Referring Phys: [id number] [facility]  Sonographer Comments: Suboptimal apical window and patient is obese. IMPRESSIONS  1. Left ventricular ejection fraction, by estimation, is 60 to 65%. The left  PROGRESS NOTE        PATIENT DETAILS Name: Tanner [name] Age: 77 y.o. Sex: male Date of Birth: December 12, 1945 Admit Date: 04/06/2023 Admitting Physician Angie Fava, DO XBM:[id number], [facility]  Brief Summary: Patient is a 77 y.o.  male chronic HFrEF, DM-2, HTN, PAF on Eliquis-sent right knee arthroplasty 12/15/2022-presented with generalized weakness/ground-level fall-along with right knee discomfort for the past several days-upon further evaluation-he was found to have AKI with polymicrobial bacteremia-likely secondary to periprosthetic right knee infection (gram-negative rod on synovial fluid culture)  Significant events: 10/22>> admit to [facility]-I&D of right knee abscess by ED physician.  Significant studies: 3/6>> echo: EF 45-50% 10/23>> CXR: No PNA 10/23>> CT head: No acute intracranial abnormality 10/23>> CT C-spine: No fracture 10/23>> CT lumbar spine: No fracture 10/23>> x-ray right knee: Right knee arthroplasty without complication-joint effusion with subcutaneous edema anteriorly. 10/23>> renal ultrasound: No hydronephrosis. 1023>> echocardiogram: EF 60-65%, no obvious vegetations identified.  Significant microbiology data: 10/22>> blood culture: Gram-positive cocci in pairs/chains (BCID-Enterococcus, Staphylococcus, Streptococcus) 10/22>> superficial knee culture: Gram-negative rod.  Procedures: 10/22>> superficial right knee abscess I&D by ED physician 10/23>> excisional/nonexcisional debridement of right knee, polyethylene revision-Dr. Constance Goltz.  Consults: Infectious disease Orthopedics  Subjective: Lying comfortably in bed-pain at the right knee site.  Objective: Vitals: Blood pressure 120/66, pulse 72, temperature 97.7 F (36.5 C), temperature source Oral, resp. rate 16, height 5\' 4"  (1.626 m), weight 100 kg, SpO2 94%.   Exam: Gen Exam:Alert awake-not in any distress HEENT:atraumatic, normocephalic Chest: B/L clear to  auscultation anteriorly CVS:S1S2 regular Abdomen:soft non tender, non distended Extremities:no edema-right knee wrapped, left leg in dressing. Neurology: Non focal Skin: no rash  Pertinent Labs/Radiology:    Latest Ref Rng & Units 04/08/2023    6:09 AM 04/07/2023   12:06 AM 04/06/2023    2:00 PM  CBC  WBC 4.0 - 10.5 K/uL 11.0  14.4  20.8   Hemoglobin 13.0 - 17.0 g/dL 7.8  8.6  36.6   Hematocrit 39.0 - 52.0 % 25.8  28.9  34.3   Platelets 150 - 400 K/uL 311  306  389     Lab Results  Component Value Date   NA 137 04/08/2023   K 4.7 04/08/2023   CL 105 04/08/2023   CO2 23 04/08/2023      Assessment/Plan: AKI on CKD stage IIIb AKI likely hemodynamically mediated in the setting of bacteremia UA negative for proteinuria Renal ultrasound without hydronephrosis Renal function gradually improving with supportive care Avoid nephrotoxic agents Follow creatinine.  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  Normocytic anemia Due to acute illness/AKI-no indication of GI loss Follow CBC periodically  DM-2 CBG stable SSI  Recent Labs    04/08/23 0529 04/08/23 0911 04/08/23 1148  GLUCAP 151* 120* 145*    HTN BP stable Resume Coreg over the next several days  Obesity: Estimated body mass index is 37.84 kg/m as calculated from the following:   Height as of this encounter: 5\' 4"  (1.626  PROGRESS NOTE        PATIENT DETAILS Name: Tanner [name] Age: 77 y.o. Sex: male Date of Birth: December 12, 1945 Admit Date: 04/06/2023 Admitting Physician Angie Fava, DO XBM:[id number], [facility]  Brief Summary: Patient is a 77 y.o.  male chronic HFrEF, DM-2, HTN, PAF on Eliquis-sent right knee arthroplasty 12/15/2022-presented with generalized weakness/ground-level fall-along with right knee discomfort for the past several days-upon further evaluation-he was found to have AKI with polymicrobial bacteremia-likely secondary to periprosthetic right knee infection (gram-negative rod on synovial fluid culture)  Significant events: 10/22>> admit to [facility]-I&D of right knee abscess by ED physician.  Significant studies: 3/6>> echo: EF 45-50% 10/23>> CXR: No PNA 10/23>> CT head: No acute intracranial abnormality 10/23>> CT C-spine: No fracture 10/23>> CT lumbar spine: No fracture 10/23>> x-ray right knee: Right knee arthroplasty without complication-joint effusion with subcutaneous edema anteriorly. 10/23>> renal ultrasound: No hydronephrosis. 1023>> echocardiogram: EF 60-65%, no obvious vegetations identified.  Significant microbiology data: 10/22>> blood culture: Gram-positive cocci in pairs/chains (BCID-Enterococcus, Staphylococcus, Streptococcus) 10/22>> superficial knee culture: Gram-negative rod.  Procedures: 10/22>> superficial right knee abscess I&D by ED physician 10/23>> excisional/nonexcisional debridement of right knee, polyethylene revision-Dr. Constance Goltz.  Consults: Infectious disease Orthopedics  Subjective: Lying comfortably in bed-pain at the right knee site.  Objective: Vitals: Blood pressure 120/66, pulse 72, temperature 97.7 F (36.5 C), temperature source Oral, resp. rate 16, height 5\' 4"  (1.626 m), weight 100 kg, SpO2 94%.   Exam: Gen Exam:Alert awake-not in any distress HEENT:atraumatic, normocephalic Chest: B/L clear to  auscultation anteriorly CVS:S1S2 regular Abdomen:soft non tender, non distended Extremities:no edema-right knee wrapped, left leg in dressing. Neurology: Non focal Skin: no rash  Pertinent Labs/Radiology:    Latest Ref Rng & Units 04/08/2023    6:09 AM 04/07/2023   12:06 AM 04/06/2023    2:00 PM  CBC  WBC 4.0 - 10.5 K/uL 11.0  14.4  20.8   Hemoglobin 13.0 - 17.0 g/dL 7.8  8.6  36.6   Hematocrit 39.0 - 52.0 % 25.8  28.9  34.3   Platelets 150 - 400 K/uL 311  306  389     Lab Results  Component Value Date   NA 137 04/08/2023   K 4.7 04/08/2023   CL 105 04/08/2023   CO2 23 04/08/2023      Assessment/Plan: AKI on CKD stage IIIb AKI likely hemodynamically mediated in the setting of bacteremia UA negative for proteinuria Renal ultrasound without hydronephrosis Renal function gradually improving with supportive care Avoid nephrotoxic agents Follow creatinine.  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  Normocytic anemia Due to acute illness/AKI-no indication of GI loss Follow CBC periodically  DM-2 CBG stable SSI  Recent Labs    04/08/23 0529 04/08/23 0911 04/08/23 1148  GLUCAP 151* 120* 145*    HTN BP stable Resume Coreg over the next several days  Obesity: Estimated body mass index is 37.84 kg/m as calculated from the following:   Height as of this encounter: 5\' 4"  (1.626  Status PENDING  Incomplete  Aerobic Culture w Gram Stain (superficial specimen)     Status: None  (Preliminary result)   Collection Time: 04/06/23  8:49 PM   Specimen: KNEE  Result Value Ref Range Status   Specimen Description KNEE  Final   Special Requests NONE  Final   Gram Stain   Final    ABUNDANT WBC PRESENT, PREDOMINANTLY PMN RARE GRAM NEGATIVE RODS    Culture   Final    NO GROWTH < 12 HOURS Performed at [facility], [address] [place], [place] [address]    Report Status PENDING  Incomplete  Aerobic/Anaerobic Culture w Gram Stain (surgical/deep wound)     Status: None (Preliminary result)   Collection Time: 04/07/23  6:09 PM   Specimen: Wound; Tissue  Result Value Ref Range Status   Specimen Description TISSUE RIGHT KNEE  Final   Special Requests NONE  Final   Gram Stain   Final    MODERATE WBC PRESENT, PREDOMINANTLY PMN NO ORGANISMS SEEN Performed at [facility], [address] [place], [place] [address]    Culture PENDING  Incomplete   Report Status PENDING  Incomplete    RADIOLOGY STUDIES/RESULTS: US RENAL  Result Date: 04/08/2023 CLINICAL DATA:  Acute renal injury EXAM: RENAL / URINARY TRACT ULTRASOUND COMPLETE COMPARISON:  CT from 12/19/2013 FINDINGS: Right Kidney: Renal measurements: 9.0 x 4.0 x 4.5 cm. = volume: 83.4 mL. Renal cysts are noted in the lower pole of the right kidney measuring up to 3.8 cm. These are simple in nature. No follow-up is recommended. Left Kidney: Renal measurements: 10.6 x 4.3 x 5.2 cm. = volume: 125.2 mL. Renal cysts are noted in the lower pole of the left kidney. The largest of these measures 6.8 cm. These are also simple in nature. No further follow-up is recommended. Bladder: Appears normal for degree of bladder distention. Other: None. IMPRESSION: Bilateral simple cysts.  No other focal abnormality is noted. Electronically Signed   By: Alcide Clever M.D.   On: 04/08/2023 01:36   ECHOCARDIOGRAM COMPLETE  Result Date: 04/07/2023    ECHOCARDIOGRAM REPORT   Patient Name:   Tanner [name] Date of Exam: 04/07/2023  Medical Rec #:  [id number]      Height:       64.0 in Accession #:    [id number]     Weight:       222.2 lb Date of Birth:  January 15, 1946      BSA:          2.046 m Patient Age:    38 years       BP:           157/125 mmHg Patient Gender: M              HR:           73 bpm. Exam Location:  Inpatient Procedure: 2D Echo, Cardiac Doppler, Color Doppler and Intracardiac            Opacification Agent Indications:    Bacteremia  History:        Patient has prior history of Echocardiogram examinations, most                 recent 08/19/2022. Risk Factors:Hypertension.  Sonographer:    [facility] Referring Phys: [id number] [facility]  Sonographer Comments: Suboptimal apical window and patient is obese. IMPRESSIONS  1. Left ventricular ejection fraction, by estimation, is 60 to 65%. The left  PROGRESS NOTE        PATIENT DETAILS Name: Tanner [name] Age: 77 y.o. Sex: male Date of Birth: December 12, 1945 Admit Date: 04/06/2023 Admitting Physician Angie Fava, DO XBM:[id number], [facility]  Brief Summary: Patient is a 77 y.o.  male chronic HFrEF, DM-2, HTN, PAF on Eliquis-sent right knee arthroplasty 12/15/2022-presented with generalized weakness/ground-level fall-along with right knee discomfort for the past several days-upon further evaluation-he was found to have AKI with polymicrobial bacteremia-likely secondary to periprosthetic right knee infection (gram-negative rod on synovial fluid culture)  Significant events: 10/22>> admit to [facility]-I&D of right knee abscess by ED physician.  Significant studies: 3/6>> echo: EF 45-50% 10/23>> CXR: No PNA 10/23>> CT head: No acute intracranial abnormality 10/23>> CT C-spine: No fracture 10/23>> CT lumbar spine: No fracture 10/23>> x-ray right knee: Right knee arthroplasty without complication-joint effusion with subcutaneous edema anteriorly. 10/23>> renal ultrasound: No hydronephrosis. 1023>> echocardiogram: EF 60-65%, no obvious vegetations identified.  Significant microbiology data: 10/22>> blood culture: Gram-positive cocci in pairs/chains (BCID-Enterococcus, Staphylococcus, Streptococcus) 10/22>> superficial knee culture: Gram-negative rod.  Procedures: 10/22>> superficial right knee abscess I&D by ED physician 10/23>> excisional/nonexcisional debridement of right knee, polyethylene revision-Dr. Constance Goltz.  Consults: Infectious disease Orthopedics  Subjective: Lying comfortably in bed-pain at the right knee site.  Objective: Vitals: Blood pressure 120/66, pulse 72, temperature 97.7 F (36.5 C), temperature source Oral, resp. rate 16, height 5\' 4"  (1.626 m), weight 100 kg, SpO2 94%.   Exam: Gen Exam:Alert awake-not in any distress HEENT:atraumatic, normocephalic Chest: B/L clear to  auscultation anteriorly CVS:S1S2 regular Abdomen:soft non tender, non distended Extremities:no edema-right knee wrapped, left leg in dressing. Neurology: Non focal Skin: no rash  Pertinent Labs/Radiology:    Latest Ref Rng & Units 04/08/2023    6:09 AM 04/07/2023   12:06 AM 04/06/2023    2:00 PM  CBC  WBC 4.0 - 10.5 K/uL 11.0  14.4  20.8   Hemoglobin 13.0 - 17.0 g/dL 7.8  8.6  36.6   Hematocrit 39.0 - 52.0 % 25.8  28.9  34.3   Platelets 150 - 400 K/uL 311  306  389     Lab Results  Component Value Date   NA 137 04/08/2023   K 4.7 04/08/2023   CL 105 04/08/2023   CO2 23 04/08/2023      Assessment/Plan: AKI on CKD stage IIIb AKI likely hemodynamically mediated in the setting of bacteremia UA negative for proteinuria Renal ultrasound without hydronephrosis Renal function gradually improving with supportive care Avoid nephrotoxic agents Follow creatinine.  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  Normocytic anemia Due to acute illness/AKI-no indication of GI loss Follow CBC periodically  DM-2 CBG stable SSI  Recent Labs    04/08/23 0529 04/08/23 0911 04/08/23 1148  GLUCAP 151* 120* 145*    HTN BP stable Resume Coreg over the next several days  Obesity: Estimated body mass index is 37.84 kg/m as calculated from the following:   Height as of this encounter: 5\' 4"  (1.626

## 2023-04-08 NOTE — Plan of Care (Signed)
  Problem: Coping: Goal: Ability to adjust to condition or change in health will improve Outcome: Progressing   Problem: Education: Goal: Knowledge of General Education information will improve Description: Including pain rating scale, medication(s)/side effects and non-pharmacologic comfort measures Outcome: Progressing   Problem: Clinical Measurements: Goal: Will remain free from infection Outcome: Progressing Goal: Respiratory complications will improve Outcome: Progressing   Problem: Pain Management: Goal: General experience of comfort will improve Outcome: Progressing   Problem: Safety: Goal: Ability to remain free from injury will improve Outcome: Progressing   Problem: Skin Integrity: Goal: Risk for impaired skin integrity will decrease Outcome: Progressing

## 2023-04-08 NOTE — Nursing Note (Signed)
Behavioral Health Integration--Enrollment In Progress  Population Health Management  BHI Enrollment Status: In Progress      Patient was enrolled in BHI on  .   The patient is   in BHI.    BHI Care Team:   Primary Care Provider Rhoderick Moody, DO  Behavioral Health Case Manager No care team member to display  Collaborating Psychiatrist No care team member to display     Received referral to [facility] Integration from Primary Care Provider. The patient has been marked as identified for potential enrollment. A future outgoing call will be made to the patient to further discuss Behavioral Health Integration and obtain verbal consent if interested.    Tasia Catchings, MSW  04/08/2023, 09:28  Behavioral Health Case Manager

## 2023-04-09 ENCOUNTER — Encounter (HOSPITAL_COMMUNITY): Payer: Self-pay

## 2023-04-09 ENCOUNTER — Ambulatory Visit (HOSPITAL_COMMUNITY): Payer: Self-pay

## 2023-04-09 DIAGNOSIS — I480 Paroxysmal atrial fibrillation: Secondary | ICD-10-CM | POA: Diagnosis not present

## 2023-04-09 DIAGNOSIS — N170 Acute kidney failure with tubular necrosis: Secondary | ICD-10-CM | POA: Diagnosis not present

## 2023-04-09 DIAGNOSIS — L02419 Cutaneous abscess of limb, unspecified: Secondary | ICD-10-CM | POA: Diagnosis not present

## 2023-04-09 DIAGNOSIS — R7881 Bacteremia: Secondary | ICD-10-CM | POA: Diagnosis not present

## 2023-04-09 DIAGNOSIS — F338 Other recurrent depressive disorders: Secondary | ICD-10-CM

## 2023-04-09 LAB — BASIC METABOLIC PANEL
Anion gap: 8 (ref 5–15)
BUN: 56 mg/dL — ABNORMAL HIGH (ref 8–23)
CO2: 21 mmol/L — ABNORMAL LOW (ref 22–32)
Calcium: 8.2 mg/dL — ABNORMAL LOW (ref 8.9–10.3)
Chloride: 106 mmol/L (ref 98–111)
Creatinine, Ser: 3.5 mg/dL — ABNORMAL HIGH (ref 0.61–1.24)
GFR, Estimated: 17 mL/min — ABNORMAL LOW (ref >60)
Glucose, Bld: 152 mg/dL — ABNORMAL HIGH (ref 70–99)
Potassium: 4.2 mmol/L (ref 3.5–5.1)
Sodium: 135 mmol/L (ref 135–145)

## 2023-04-09 LAB — GLUCOSE, CAPILLARY
Glucose-Capillary: 122 mg/dL — ABNORMAL HIGH (ref 70–99)
Glucose-Capillary: 182 mg/dL — ABNORMAL HIGH (ref 70–99)
Glucose-Capillary: 87 mg/dL (ref 70–99)
Glucose-Capillary: 141 mg/dL — ABNORMAL HIGH (ref 70–99)

## 2023-04-09 LAB — CBC
HCT: 22.9 % — ABNORMAL LOW (ref 39.0–52.0)
Hemoglobin: 7.1 g/dL — ABNORMAL LOW (ref 13.0–17.0)
MCH: 24.5 pg — ABNORMAL LOW (ref 26.0–34.0)
MCHC: 31 g/dL (ref 30.0–36.0)
MCV: 79 fL — ABNORMAL LOW (ref 80.0–100.0)
Platelets: 304 10*3/uL (ref 150–400)
RBC: 2.9 MIL/uL — ABNORMAL LOW (ref 4.22–5.81)
RDW: 18 % — ABNORMAL HIGH (ref 11.5–15.5)
WBC: 14.4 10*3/uL — ABNORMAL HIGH (ref 4.0–10.5)
nRBC: 0 % (ref 0.0–0.2)

## 2023-04-09 LAB — AEROBIC CULTURE W GRAM STAIN (SUPERFICIAL SPECIMEN)

## 2023-04-09 MED ORDER — SODIUM CHLORIDE 0.9 % IV SOLN
2.0000 g | INTRAVENOUS | Status: DC
Start: 2023-04-09 — End: 2023-04-13
  Administered 2023-04-09 – 2023-04-12 (×4): 2 g via INTRAVENOUS
  Filled 2023-04-09 (×4): qty 20

## 2023-04-09 MED ORDER — SODIUM CHLORIDE 0.9 % IV SOLN: INTRAVENOUS | Status: AC

## 2023-04-09 NOTE — Progress Notes (Signed)
[facility]  Date of Admission:  04/06/2023   Total days of inpatient antibiotics 3  Principal Problem:   Infection of prosthetic right knee joint (HCC) Active Problems:   Essential hypertension   Acute renal failure superimposed on stage 3b chronic kidney disease (HCC)   DM2 (diabetes mellitus, type 2) (HCC)   Chronic systolic CHF (congestive heart failure) (HCC)   Paroxysmal atrial fibrillation (HCC)   Generalized weakness   Fall at home, initial encounter   Acute on chronic anemia   Hyperlipidemia   MRSA bacteremia   Enterococcal bacteremia   Streptococcal bacteremia          Assessment:  77 year old male with history of heart chronic systolic heart failure, diabetes melitis complicated by diabetic peripheral polyneuropathy, essential hypertension, hyperlipidemia, and A-fib on Eliquis, CKD stage III admitted to [facility] with concern for cellulitis of the right lower extremity.  #Polymicrobial bacteremia with E faecalis, MRSA, strep species 1 out of 2 sets secondary to right TKA #PJI right knee status post debridement and polyethylene exchange on 10/23 -Patient had underwent right TKA on 7/24 with Dr. Charlann Boxer. and notes increased pain for the last few days at the site.  - On arrival to the ED patient  20K.  Afebrile.   - Blood cultures obtained with BC ID positive for E faecalis, MRSA, strep species.  Blood cultures grew E faecalis and MRSA 1/2 - 10/22 knee culture form I&D in ED growing MRSA  with Gram stain GNR taken to the OR on 10/23 with orthopedics for right knee debridement, polyethylene revision. - Or cultures on 10/23 growing rare Staph aureus.  Purulence noted in the OR, cultures growing Staph aureus, reintubating Recommendations: - Continue vancomycin and ceftriaxone given gram statin showed gnr on 10/22 from I&D - Repeat blood cultures from 10/24 no growth so far.  Place PICC on Monday(Blood Cx negative x 72h).  Anticipate at  least 6 weeks of antibiotics followed by p.o. suppression given prosthesis is retained.  - TEE on 10/28   Dr. Renold Don will be covering the weekend.  I will plical service on Monday. Microbiology:   Antibiotics: Vancomycin 10/22 Ceftriaxone 10/22 >>  Cultures: Blood 10/20 to 1/2 E faecalis and MRSA 10/24 NG Urine  Other 10/22 knee aspirate MRSA, Gram stain showing GNR - 10/23 Staph aureus, reintubating  SUBJECTIVE: In chair.  No new complaints. Interval: Afebrile overnight.  WBC 14 54K. Review of Systems: Review of Systems  All other systems reviewed and are negative.    Scheduled Meds:  acetaminophen  650 mg Oral Q6H   apixaban  5 mg Oral BID   insulin aspart  0-6 Units Subcutaneous TID WC   polyethylene glycol  17 g Oral BID   senna  2 tablet Oral QHS   Continuous Infusions:  sodium chloride 40 mL/hr at 04/09/23 1132   cefTRIAXone (ROCEPHIN)  IV 2 g (04/08/23 1745)   DAPTOmycin 800 mg (04/08/23 1644)   PRN Meds:.alum & mag hydroxide-simeth, bisacodyl, diphenhydrAMINE, HYDROmorphone (DILAUDID) injection, melatonin, menthol-cetylpyridinium **OR** phenol, methocarbamol **OR** methocarbamol (ROBAXIN) injection, metoCLOPramide **OR** metoCLOPramide (REGLAN) injection, naLOXone (NARCAN)  injection, ondansetron **OR** ondansetron (ZOFRAN) IV, oxyCODONE, oxyCODONE Allergies  Allergen Reactions   Lisinopril Other (See Comments)     Renal impairment   Amlodipine Swelling   Codeine Sulfate Itching and Nausea Only    OBJECTIVE: Vitals:   04/09/23 0346 04/09/23 0815 04/09/23 1156 04/09/23 1300  BP: (!) 119/46 (!) 120/55 Marland Kitchen)  120/46   Pulse: 62 62 66   Resp: 14 14 (!) 22   Temp: 98 F (36.7 C) 97.6 F (36.4 C) (!) 97.5 F (36.4 C)   TempSrc: Oral Oral Oral   SpO2: 91% 96% 92% 96%  Weight:      Height:       Body mass index is 37.84 kg/m.  Physical Exam Constitutional:      General: He is not in acute distress.    Appearance: He is normal weight. He is not  toxic-appearing.  HENT:     Head: Normocephalic and atraumatic.     Right Ear: External ear normal.     Left Ear: External ear normal.     Nose: No congestion or rhinorrhea.     Mouth/Throat:     Mouth: Mucous membranes are moist.     Pharynx: Oropharynx is clear.  Eyes:     Extraocular Movements: Extraocular movements intact.     Conjunctiva/sclera: Conjunctivae normal.     Pupils: Pupils are equal, round, and reactive to light.  Cardiovascular:     Rate and Rhythm: Normal rate and regular rhythm.     Heart sounds: No murmur heard.    No friction rub. No gallop.  Pulmonary:     Effort: Pulmonary effort is normal.     Breath sounds: Normal breath sounds.  Abdominal:     General: Abdomen is flat. Bowel sounds are normal.     Palpations: Abdomen is soft.  Musculoskeletal:        General: No swelling. Normal range of motion.     Cervical back: Normal range of motion and neck supple.  Skin:    General: Skin is warm and dry.  Neurological:     General: No focal deficit present.     Mental Status: He is oriented to person, place, and time.  Psychiatric:        Mood and Affect: Mood normal.       Lab Results Lab Results  Component Value Date   WBC 14.4 (H) 04/09/2023   HGB 7.1 (L) 04/09/2023   HCT 22.9 (L) 04/09/2023   MCV 79.0 (L) 04/09/2023   PLT 304 04/09/2023    Lab Results  Component Value Date   CREATININE 3.50 (H) 04/09/2023   BUN 56 (H) 04/09/2023   NA 135 04/09/2023   K 4.2 04/09/2023   CL 106 04/09/2023   CO2 21 (L) 04/09/2023    Lab Results  Component Value Date   ALT 19 04/08/2023   AST 21 04/08/2023   ALKPHOS 68 04/08/2023   BILITOT 0.4 04/08/2023        Danelle Earthly, MD [facility] 04/09/2023, 1:49 PM   I have personally spent 52 minutes involved in face-to-face and non-face-to-face activities for this patient on the day of the visit. Professional time spent includes the following  activities: Preparing to see the patient (review of tests), Obtaining and/or reviewing separately obtained history (admission/discharge record), Performing a medically appropriate examination and/or evaluation , Ordering medications/tests/procedures, referring and communicating with other health care professionals, Documenting clinical information in the EMR, Independently interpreting results (not separately reported), Communicating results to the patient/family/caregiver, Counseling and educating the patient/family/caregiver and Care coordination (not separately reported).

## 2023-04-09 NOTE — Progress Notes (Signed)
Subjective: 2 Days Post-Op Procedure(s) (LRB): IRRIGATION AND DEBRIDEMENT KNEE WITH POLY EXCHANGE (Right) Patient reports pain as mild.   Patient seen in rounds for Dr. Charlann Boxer. Patient is well, and has had no acute complaints or problems. No acute events overnight. He looks much better this morning. More alert and conversational. He is frustrated with his condition, and a bit upset about recommendations to go to SNF. He did not have a good experience at [facility] last time.  We will continue therapy today.   Objective: Vital signs in last 24 hours: Temp:  [97.5 F (36.4 C)-98.1 F (36.7 C)] 98 F (36.7 C) (10/25 0346) Pulse Rate:  [62-84] 62 (10/25 0346) Resp:  [14-20] 14 (10/25 0346) BP: (113-132)/(46-66) 119/46 (10/25 0346) SpO2:  [91 %-100 %] 91 % (10/25 0346)  Intake/Output from previous day:  Intake/Output Summary (Last 24 hours) at 04/09/2023 0752 Last data filed at 04/09/2023 0100 Gross per 24 hour  Intake 480 ml  Output 600 ml  Net -120 ml     Intake/Output this shift: No intake/output data recorded.  Labs: Recent Labs    04/06/23 1351 04/06/23 1400 04/07/23 0006 04/08/23 0609 04/09/23 0355  HGB 10.0* 10.3* 8.6* 7.8* 7.1*   Recent Labs    04/08/23 0609 04/09/23 0355  WBC 11.0* 14.4*  RBC 3.26* 2.90*  HCT 25.8* 22.9*  PLT 311 304   Recent Labs    04/08/23 1051 04/09/23 0355  NA 138 135  K 4.8 4.2  CL 105 106  CO2 21* 21*  BUN 55* 56*  CREATININE 3.97* 3.50*  GLUCOSE 134* 152*  CALCIUM 8.5* 8.2*   No results for input(s): "LABPT", "INR" in the last 72 hours.  Exam: General - Patient is Alert and Oriented Extremity - Neurologically intact Sensation intact distally Intact pulses distally Dorsiflexion/Plantar flexion intact Dressing - dressing C/D/I Motor Function - intact, moving foot and toes well on exam.   Past Medical History:  Diagnosis Date   Acute kidney failure (HCC)    Anxiety    Arthritis    Back pain    BPH (benign  prostatic hypertrophy)    CHF (congestive heart failure) (HCC)    Clostridium difficile infection    Clotting disorder (HCC)    Depression    Diabetes (HCC) 12/11/2016   type 2    DVT (deep venous thrombosis) (HCC)    DVT of deep femoral vein, right (HCC) 06/29/2018   Edema, lower extremity    High cholesterol    Hypertension    Joint pain    Low back pain potentially associated with spinal stenosis    Neuromuscular disorder (HCC)    Neuropathy of lower extremity    bilateral   OSA (obstructive sleep apnea)    pt denies    Osteoarthritis    PE (pulmonary embolism)    Pneumonia    hx of x 2    Ventral hernia     Assessment/Plan: 2 Days Post-Op Procedure(s) (LRB): IRRIGATION AND DEBRIDEMENT KNEE WITH POLY EXCHANGE (Right) Principal Problem:   Infection of prosthetic right knee joint (HCC) Active Problems:   Essential hypertension   Acute renal failure superimposed on stage 3b chronic kidney disease (HCC)   DM2 (diabetes mellitus, type 2) (HCC)   Chronic systolic CHF (congestive heart failure) (HCC)   Paroxysmal atrial fibrillation (HCC)   Generalized weakness   Fall at home, initial encounter   Acute on chronic anemia   Hyperlipidemia   MRSA bacteremia   Enterococcal bacteremia  Streptococcal bacteremia  Estimated body mass index is 37.84 kg/m as calculated from the following:   Height as of this encounter: 5\' 4"  (1.626 m).   Weight as of this encounter: 100 kg. Advance diet Up with therapy   No growth thus far on intra-op cultures Recommendations per ID  Patient is very upset at the idea of SNF -- he had a very poor experience at [facility] Will appreciate the assistance of social work if this ends up being his needed disposition to find a good place for him  Back on Eliquis 5 BID ABLA on chronic anemia - Hgb 7.1 this AM - reports of dizziness with PT yesterday  Has not required pain meds    Rosalene Billings, PA-C Orthopedic Surgery [phone] 04/09/2023, 7:52 AM

## 2023-04-09 NOTE — Progress Notes (Signed)
Status: None (Preliminary result)   Collection Time: 04/08/23  6:11 AM   Specimen: BLOOD LEFT ARM  Result Value Ref Range Status   Specimen Description BLOOD LEFT ARM  Final   Special Requests   Final    BOTTLES DRAWN AEROBIC AND ANAEROBIC Blood Culture adequate volume   Culture   Final    NO GROWTH 1 DAY Performed at [facility], [address], [place], [place] [address]    Report Status PENDING  Incomplete    RADIOLOGY STUDIES/RESULTS: US RENAL  Result Date: 04/08/2023 CLINICAL DATA:  Acute renal injury EXAM: RENAL / URINARY TRACT ULTRASOUND COMPLETE COMPARISON:  CT from 12/19/2013 FINDINGS: Right Kidney: Renal measurements: 9.0 x 4.0 x 4.5 cm. = volume: 83.4 mL. Renal cysts are noted in the lower pole of the right kidney measuring up to 3.8 cm. These are simple in nature. No follow-up is recommended. Left Kidney: Renal measurements: 10.6 x 4.3 x 5.2 cm. = volume: 125.2 mL. Renal cysts are noted in the lower pole of the left  kidney. The largest of these measures 6.8 cm. These are also simple in nature. No further follow-up is recommended. Bladder: Appears normal for degree of bladder distention. Other: None. IMPRESSION: Bilateral simple cysts.  No other focal abnormality is noted. Electronically Signed   By: Alcide Clever M.D.   On: 04/08/2023 01:36   ECHOCARDIOGRAM COMPLETE  Result Date: 04/07/2023    ECHOCARDIOGRAM REPORT   Patient Name:   Tanner [name] Date of Exam: 04/07/2023 Medical Rec #:  [id number]      Height:       64.0 in Accession #:    [id number]     Weight:       222.2 lb Date of Birth:  [birth year]      BSA:          2.046 m Patient Age:    77 years       BP:           157/125 mmHg Patient Gender: M              HR:           73 bpm. Exam Location:  Inpatient Procedure: 2D Echo, Cardiac Doppler, Color Doppler and Intracardiac            Opacification Agent Indications:    Bacteremia  History:        Patient has prior history of Echocardiogram examinations, most                 recent 08/19/2022. Risk Factors:Hypertension.  Sonographer:    [employer] [facility] Referring Phys: [id number] [facility]  Sonographer Comments: Suboptimal apical window and patient is obese. IMPRESSIONS  1. Left ventricular ejection fraction, by estimation, is 60 to 65%. The left ventricle has normal function. The left ventricle has no regional wall motion abnormalities. Left ventricular diastolic parameters were normal.  2. Right ventricular systolic function is normal. The right ventricular size is normal.  3. The mitral valve is normal in structure. No evidence of mitral valve regurgitation. No evidence of mitral stenosis.  4. The aortic valve is normal in structure. Aortic valve regurgitation is not visualized. No aortic stenosis is present.  5. The inferior vena cava is normal in size with greater than 50% respiratory variability, suggesting right atrial pressure of 3 mmHg.  6. No obvious vegetations identified; consider TEE if clinically indicated. FINDINGS   Left Ventricle: Left ventricular ejection fraction, by estimation,  Status: None (Preliminary result)   Collection Time: 04/08/23  6:11 AM   Specimen: BLOOD LEFT ARM  Result Value Ref Range Status   Specimen Description BLOOD LEFT ARM  Final   Special Requests   Final    BOTTLES DRAWN AEROBIC AND ANAEROBIC Blood Culture adequate volume   Culture   Final    NO GROWTH 1 DAY Performed at [facility], [address], [place], [place] [address]    Report Status PENDING  Incomplete    RADIOLOGY STUDIES/RESULTS: US RENAL  Result Date: 04/08/2023 CLINICAL DATA:  Acute renal injury EXAM: RENAL / URINARY TRACT ULTRASOUND COMPLETE COMPARISON:  CT from 12/19/2013 FINDINGS: Right Kidney: Renal measurements: 9.0 x 4.0 x 4.5 cm. = volume: 83.4 mL. Renal cysts are noted in the lower pole of the right kidney measuring up to 3.8 cm. These are simple in nature. No follow-up is recommended. Left Kidney: Renal measurements: 10.6 x 4.3 x 5.2 cm. = volume: 125.2 mL. Renal cysts are noted in the lower pole of the left  kidney. The largest of these measures 6.8 cm. These are also simple in nature. No further follow-up is recommended. Bladder: Appears normal for degree of bladder distention. Other: None. IMPRESSION: Bilateral simple cysts.  No other focal abnormality is noted. Electronically Signed   By: Alcide Clever M.D.   On: 04/08/2023 01:36   ECHOCARDIOGRAM COMPLETE  Result Date: 04/07/2023    ECHOCARDIOGRAM REPORT   Patient Name:   Tanner [name] Date of Exam: 04/07/2023 Medical Rec #:  [id number]      Height:       64.0 in Accession #:    [id number]     Weight:       222.2 lb Date of Birth:  [birth year]      BSA:          2.046 m Patient Age:    77 years       BP:           157/125 mmHg Patient Gender: M              HR:           73 bpm. Exam Location:  Inpatient Procedure: 2D Echo, Cardiac Doppler, Color Doppler and Intracardiac            Opacification Agent Indications:    Bacteremia  History:        Patient has prior history of Echocardiogram examinations, most                 recent 08/19/2022. Risk Factors:Hypertension.  Sonographer:    [facility] Referring Phys: [id number] [facility]  Sonographer Comments: Suboptimal apical window and patient is obese. IMPRESSIONS  1. Left ventricular ejection fraction, by estimation, is 60 to 65%. The left ventricle has normal function. The left ventricle has no regional wall motion abnormalities. Left ventricular diastolic parameters were normal.  2. Right ventricular systolic function is normal. The right ventricular size is normal.  3. The mitral valve is normal in structure. No evidence of mitral valve regurgitation. No evidence of mitral stenosis.  4. The aortic valve is normal in structure. Aortic valve regurgitation is not visualized. No aortic stenosis is present.  5. The inferior vena cava is normal in size with greater than 50% respiratory variability, suggesting right atrial pressure of 3 mmHg.  6. No obvious vegetations identified; consider TEE if clinically indicated. FINDINGS   Left Ventricle: Left ventricular ejection fraction, by estimation,  Status: None (Preliminary result)   Collection Time: 04/08/23  6:11 AM   Specimen: BLOOD LEFT ARM  Result Value Ref Range Status   Specimen Description BLOOD LEFT ARM  Final   Special Requests   Final    BOTTLES DRAWN AEROBIC AND ANAEROBIC Blood Culture adequate volume   Culture   Final    NO GROWTH 1 DAY Performed at [facility], [address], [place], [place] [address]    Report Status PENDING  Incomplete    RADIOLOGY STUDIES/RESULTS: US RENAL  Result Date: 04/08/2023 CLINICAL DATA:  Acute renal injury EXAM: RENAL / URINARY TRACT ULTRASOUND COMPLETE COMPARISON:  CT from 12/19/2013 FINDINGS: Right Kidney: Renal measurements: 9.0 x 4.0 x 4.5 cm. = volume: 83.4 mL. Renal cysts are noted in the lower pole of the right kidney measuring up to 3.8 cm. These are simple in nature. No follow-up is recommended. Left Kidney: Renal measurements: 10.6 x 4.3 x 5.2 cm. = volume: 125.2 mL. Renal cysts are noted in the lower pole of the left  kidney. The largest of these measures 6.8 cm. These are also simple in nature. No further follow-up is recommended. Bladder: Appears normal for degree of bladder distention. Other: None. IMPRESSION: Bilateral simple cysts.  No other focal abnormality is noted. Electronically Signed   By: Alcide Clever M.D.   On: 04/08/2023 01:36   ECHOCARDIOGRAM COMPLETE  Result Date: 04/07/2023    ECHOCARDIOGRAM REPORT   Patient Name:   Tanner [name] Date of Exam: 04/07/2023 Medical Rec #:  [id number]      Height:       64.0 in Accession #:    [id number]     Weight:       222.2 lb Date of Birth:  [birth year]      BSA:          2.046 m Patient Age:    77 years       BP:           157/125 mmHg Patient Gender: M              HR:           73 bpm. Exam Location:  Inpatient Procedure: 2D Echo, Cardiac Doppler, Color Doppler and Intracardiac            Opacification Agent Indications:    Bacteremia  History:        Patient has prior history of Echocardiogram examinations, most                 recent 08/19/2022. Risk Factors:Hypertension.  Sonographer:    [employer] [facility] Referring Phys: [id number] [facility]  Sonographer Comments: Suboptimal apical window and patient is obese. IMPRESSIONS  1. Left ventricular ejection fraction, by estimation, is 60 to 65%. The left ventricle has normal function. The left ventricle has no regional wall motion abnormalities. Left ventricular diastolic parameters were normal.  2. Right ventricular systolic function is normal. The right ventricular size is normal.  3. The mitral valve is normal in structure. No evidence of mitral valve regurgitation. No evidence of mitral stenosis.  4. The aortic valve is normal in structure. Aortic valve regurgitation is not visualized. No aortic stenosis is present.  5. The inferior vena cava is normal in size with greater than 50% respiratory variability, suggesting right atrial pressure of 3 mmHg.  6. No obvious vegetations identified; consider TEE if clinically indicated. FINDINGS   Left Ventricle: Left ventricular ejection fraction, by estimation,  PROGRESS NOTE        PATIENT DETAILS Name: Tanner [name] Age: 77 y.o. Sex: male Date of Birth: 21-Jun-1945 Admit Date: 04/06/2023 Admitting Physician Angie Fava, DO WUJ:[id number], [facility]  Brief Summary: Patient is a 77 y.o.  male chronic HFrEF, DM-2, HTN, PAF on Eliquis-sent right knee arthroplasty 12/15/2022-presented with generalized weakness/ground-level fall-along with right knee discomfort for the past several days-upon further evaluation-he was found to have AKI with polymicrobial bacteremia-likely secondary to periprosthetic right knee infection (gram-negative rod on synovial fluid culture)  Significant events: 10/22>> admit to [facility]-I&D of right knee abscess by ED physician.  Significant studies: 3/6>> echo: EF 45-50% 10/23>> CXR: No PNA 10/23>> CT head: No acute intracranial abnormality 10/23>> CT C-spine: No fracture 10/23>> CT lumbar spine: No fracture 10/23>> x-ray right knee: Right knee arthroplasty without complication-joint effusion with subcutaneous edema anteriorly. 10/23>> renal ultrasound: No hydronephrosis. 1023>> echocardiogram: EF 60-65%, no obvious vegetations identified.  Significant microbiology data: 10/22>> blood culture: Staph aureus/Enterococcus faecalis 10/22>> superficial knee culture: MRSA 10/23>> intraoperative right knee culture: No growth 10/24>> blood culture: Negative  Procedures: 10/22>> superficial right knee abscess I&D by ED physician 10/23>> excisional/nonexcisional debridement of right knee, polyethylene revision-Dr. Constance Goltz.  Consults: Infectious disease Orthopedics  Subjective: No issues overnight-lying comfortably in bed.  Objective: Vitals: Blood pressure (!) 120/55, pulse 62, temperature 97.6 F (36.4 C), temperature source Oral, resp. rate 14, height 5\' 4"  (1.626 m), weight 100 kg, SpO2 96%.   Exam: Gen Exam:Alert awake-not in any distress HEENT:atraumatic, normocephalic Chest: B/L  clear to auscultation anteriorly CVS:S1S2 regular Abdomen:soft non tender, non distended Extremities: Right leg wrapped-left leg-dressing in place. Neurology: Non focal Skin: no rash  Pertinent Labs/Radiology:    Latest Ref Rng & Units 04/09/2023    3:55 AM 04/08/2023    6:09 AM 04/07/2023   12:06 AM  CBC  WBC 4.0 - 10.5 K/uL 14.4  11.0  14.4   Hemoglobin 13.0 - 17.0 g/dL 7.1  7.8  8.6   Hematocrit 39.0 - 52.0 % 22.9  25.8  28.9   Platelets 150 - 400 K/uL 304  311  306     Lab Results  Component Value Date   NA 135 04/09/2023   K 4.2 04/09/2023   CL 106 04/09/2023   CO2 21 (L) 04/09/2023      Assessment/Plan: AKI on CKD stage IIIb AKI likely hemodynamically mediated in the setting of bacteremia UA negative for proteinuria Renal ultrasound without hydronephrosis Renal function gradually improving with supportive care Avoid nephrotoxic agents Follow creatinine.  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-history of right knee total arthroplasty July 2024 Culture data as above Echo negative for vegetation Daptomycin/Rocephin Repeat blood cultures on 10/24 negative Await further recommendations from infectious disease  Mechanical fall/generalized weakness Likely due to debility in the setting of AKI/bacteremia Imaging negative for fractures PT/OT eval likely will require SNF  Chronic HFrEF Euvolemic Hold diuretics/Jardiance due to AKI  PAF Telemetry monitoring Discussion with orthopedics on 10/24--Eliquis resumed  Normocytic anemia Due to acute illness/AKI-no indication of GI loss-drop in hemoglobin overnight is likely due to IV fluid dilution/critical illness Follow CBC and transfuse PRBC accordingly.  DM-2 CBG stable SSI  Recent Labs    04/08/23 1555 04/08/23 2127 04/09/23 0812  GLUCAP 177* 198* 122*    HTN BP stable Resume Coreg over the next several days  PROGRESS NOTE        PATIENT DETAILS Name: Tanner [name] Age: 77 y.o. Sex: male Date of Birth: 21-Jun-1945 Admit Date: 04/06/2023 Admitting Physician Angie Fava, DO WUJ:[id number], [facility]  Brief Summary: Patient is a 77 y.o.  male chronic HFrEF, DM-2, HTN, PAF on Eliquis-sent right knee arthroplasty 12/15/2022-presented with generalized weakness/ground-level fall-along with right knee discomfort for the past several days-upon further evaluation-he was found to have AKI with polymicrobial bacteremia-likely secondary to periprosthetic right knee infection (gram-negative rod on synovial fluid culture)  Significant events: 10/22>> admit to [facility]-I&D of right knee abscess by ED physician.  Significant studies: 3/6>> echo: EF 45-50% 10/23>> CXR: No PNA 10/23>> CT head: No acute intracranial abnormality 10/23>> CT C-spine: No fracture 10/23>> CT lumbar spine: No fracture 10/23>> x-ray right knee: Right knee arthroplasty without complication-joint effusion with subcutaneous edema anteriorly. 10/23>> renal ultrasound: No hydronephrosis. 1023>> echocardiogram: EF 60-65%, no obvious vegetations identified.  Significant microbiology data: 10/22>> blood culture: Staph aureus/Enterococcus faecalis 10/22>> superficial knee culture: MRSA 10/23>> intraoperative right knee culture: No growth 10/24>> blood culture: Negative  Procedures: 10/22>> superficial right knee abscess I&D by ED physician 10/23>> excisional/nonexcisional debridement of right knee, polyethylene revision-Dr. Constance Goltz.  Consults: Infectious disease Orthopedics  Subjective: No issues overnight-lying comfortably in bed.  Objective: Vitals: Blood pressure (!) 120/55, pulse 62, temperature 97.6 F (36.4 C), temperature source Oral, resp. rate 14, height 5\' 4"  (1.626 m), weight 100 kg, SpO2 96%.   Exam: Gen Exam:Alert awake-not in any distress HEENT:atraumatic, normocephalic Chest: B/L  clear to auscultation anteriorly CVS:S1S2 regular Abdomen:soft non tender, non distended Extremities: Right leg wrapped-left leg-dressing in place. Neurology: Non focal Skin: no rash  Pertinent Labs/Radiology:    Latest Ref Rng & Units 04/09/2023    3:55 AM 04/08/2023    6:09 AM 04/07/2023   12:06 AM  CBC  WBC 4.0 - 10.5 K/uL 14.4  11.0  14.4   Hemoglobin 13.0 - 17.0 g/dL 7.1  7.8  8.6   Hematocrit 39.0 - 52.0 % 22.9  25.8  28.9   Platelets 150 - 400 K/uL 304  311  306     Lab Results  Component Value Date   NA 135 04/09/2023   K 4.2 04/09/2023   CL 106 04/09/2023   CO2 21 (L) 04/09/2023      Assessment/Plan: AKI on CKD stage IIIb AKI likely hemodynamically mediated in the setting of bacteremia UA negative for proteinuria Renal ultrasound without hydronephrosis Renal function gradually improving with supportive care Avoid nephrotoxic agents Follow creatinine.  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-history of right knee total arthroplasty July 2024 Culture data as above Echo negative for vegetation Daptomycin/Rocephin Repeat blood cultures on 10/24 negative Await further recommendations from infectious disease  Mechanical fall/generalized weakness Likely due to debility in the setting of AKI/bacteremia Imaging negative for fractures PT/OT eval likely will require SNF  Chronic HFrEF Euvolemic Hold diuretics/Jardiance due to AKI  PAF Telemetry monitoring Discussion with orthopedics on 10/24--Eliquis resumed  Normocytic anemia Due to acute illness/AKI-no indication of GI loss-drop in hemoglobin overnight is likely due to IV fluid dilution/critical illness Follow CBC and transfuse PRBC accordingly.  DM-2 CBG stable SSI  Recent Labs    04/08/23 1555 04/08/23 2127 04/09/23 0812  GLUCAP 177* 198* 122*    HTN BP stable Resume Coreg over the next several days  Status: None (Preliminary result)   Collection Time: 04/08/23  6:11 AM   Specimen: BLOOD LEFT ARM  Result Value Ref Range Status   Specimen Description BLOOD LEFT ARM  Final   Special Requests   Final    BOTTLES DRAWN AEROBIC AND ANAEROBIC Blood Culture adequate volume   Culture   Final    NO GROWTH 1 DAY Performed at [facility], [address], [place], [place] [address]    Report Status PENDING  Incomplete    RADIOLOGY STUDIES/RESULTS: US RENAL  Result Date: 04/08/2023 CLINICAL DATA:  Acute renal injury EXAM: RENAL / URINARY TRACT ULTRASOUND COMPLETE COMPARISON:  CT from 12/19/2013 FINDINGS: Right Kidney: Renal measurements: 9.0 x 4.0 x 4.5 cm. = volume: 83.4 mL. Renal cysts are noted in the lower pole of the right kidney measuring up to 3.8 cm. These are simple in nature. No follow-up is recommended. Left Kidney: Renal measurements: 10.6 x 4.3 x 5.2 cm. = volume: 125.2 mL. Renal cysts are noted in the lower pole of the left  kidney. The largest of these measures 6.8 cm. These are also simple in nature. No further follow-up is recommended. Bladder: Appears normal for degree of bladder distention. Other: None. IMPRESSION: Bilateral simple cysts.  No other focal abnormality is noted. Electronically Signed   By: Alcide Clever M.D.   On: 04/08/2023 01:36   ECHOCARDIOGRAM COMPLETE  Result Date: 04/07/2023    ECHOCARDIOGRAM REPORT   Patient Name:   Tanner [name] Date of Exam: 04/07/2023 Medical Rec #:  [id number]      Height:       64.0 in Accession #:    [id number]     Weight:       222.2 lb Date of Birth:  [birth year]      BSA:          2.046 m Patient Age:    77 years       BP:           157/125 mmHg Patient Gender: M              HR:           73 bpm. Exam Location:  Inpatient Procedure: 2D Echo, Cardiac Doppler, Color Doppler and Intracardiac            Opacification Agent Indications:    Bacteremia  History:        Patient has prior history of Echocardiogram examinations, most                 recent 08/19/2022. Risk Factors:Hypertension.  Sonographer:    [employer] Referring Phys: [id number] [facility]  Sonographer Comments: Suboptimal apical window and patient is obese. IMPRESSIONS  1. Left ventricular ejection fraction, by estimation, is 60 to 65%. The left ventricle has normal function. The left ventricle has no regional wall motion abnormalities. Left ventricular diastolic parameters were normal.  2. Right ventricular systolic function is normal. The right ventricular size is normal.  3. The mitral valve is normal in structure. No evidence of mitral valve regurgitation. No evidence of mitral stenosis.  4. The aortic valve is normal in structure. Aortic valve regurgitation is not visualized. No aortic stenosis is present.  5. The inferior vena cava is normal in size with greater than 50% respiratory variability, suggesting right atrial pressure of 3 mmHg.  6. No obvious vegetations identified; consider TEE if clinically indicated. FINDINGS   Left Ventricle: Left ventricular ejection fraction, by estimation,  PROGRESS NOTE        PATIENT DETAILS Name: Tanner [name] Age: 77 y.o. Sex: male Date of Birth: 21-Jun-1945 Admit Date: 04/06/2023 Admitting Physician Angie Fava, DO WUJ:[id number], [facility]  Brief Summary: Patient is a 77 y.o.  male chronic HFrEF, DM-2, HTN, PAF on Eliquis-sent right knee arthroplasty 12/15/2022-presented with generalized weakness/ground-level fall-along with right knee discomfort for the past several days-upon further evaluation-he was found to have AKI with polymicrobial bacteremia-likely secondary to periprosthetic right knee infection (gram-negative rod on synovial fluid culture)  Significant events: 10/22>> admit to [facility]-I&D of right knee abscess by ED physician.  Significant studies: 3/6>> echo: EF 45-50% 10/23>> CXR: No PNA 10/23>> CT head: No acute intracranial abnormality 10/23>> CT C-spine: No fracture 10/23>> CT lumbar spine: No fracture 10/23>> x-ray right knee: Right knee arthroplasty without complication-joint effusion with subcutaneous edema anteriorly. 10/23>> renal ultrasound: No hydronephrosis. 1023>> echocardiogram: EF 60-65%, no obvious vegetations identified.  Significant microbiology data: 10/22>> blood culture: Staph aureus/Enterococcus faecalis 10/22>> superficial knee culture: MRSA 10/23>> intraoperative right knee culture: No growth 10/24>> blood culture: Negative  Procedures: 10/22>> superficial right knee abscess I&D by ED physician 10/23>> excisional/nonexcisional debridement of right knee, polyethylene revision-Dr. Constance Goltz.  Consults: Infectious disease Orthopedics  Subjective: No issues overnight-lying comfortably in bed.  Objective: Vitals: Blood pressure (!) 120/55, pulse 62, temperature 97.6 F (36.4 C), temperature source Oral, resp. rate 14, height 5\' 4"  (1.626 m), weight 100 kg, SpO2 96%.   Exam: Gen Exam:Alert awake-not in any distress HEENT:atraumatic, normocephalic Chest: B/L  clear to auscultation anteriorly CVS:S1S2 regular Abdomen:soft non tender, non distended Extremities: Right leg wrapped-left leg-dressing in place. Neurology: Non focal Skin: no rash  Pertinent Labs/Radiology:    Latest Ref Rng & Units 04/09/2023    3:55 AM 04/08/2023    6:09 AM 04/07/2023   12:06 AM  CBC  WBC 4.0 - 10.5 K/uL 14.4  11.0  14.4   Hemoglobin 13.0 - 17.0 g/dL 7.1  7.8  8.6   Hematocrit 39.0 - 52.0 % 22.9  25.8  28.9   Platelets 150 - 400 K/uL 304  311  306     Lab Results  Component Value Date   NA 135 04/09/2023   K 4.2 04/09/2023   CL 106 04/09/2023   CO2 21 (L) 04/09/2023      Assessment/Plan: AKI on CKD stage IIIb AKI likely hemodynamically mediated in the setting of bacteremia UA negative for proteinuria Renal ultrasound without hydronephrosis Renal function gradually improving with supportive care Avoid nephrotoxic agents Follow creatinine.  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-history of right knee total arthroplasty July 2024 Culture data as above Echo negative for vegetation Daptomycin/Rocephin Repeat blood cultures on 10/24 negative Await further recommendations from infectious disease  Mechanical fall/generalized weakness Likely due to debility in the setting of AKI/bacteremia Imaging negative for fractures PT/OT eval likely will require SNF  Chronic HFrEF Euvolemic Hold diuretics/Jardiance due to AKI  PAF Telemetry monitoring Discussion with orthopedics on 10/24--Eliquis resumed  Normocytic anemia Due to acute illness/AKI-no indication of GI loss-drop in hemoglobin overnight is likely due to IV fluid dilution/critical illness Follow CBC and transfuse PRBC accordingly.  DM-2 CBG stable SSI  Recent Labs    04/08/23 1555 04/08/23 2127 04/09/23 0812  GLUCAP 177* 198* 122*    HTN BP stable Resume Coreg over the next several days  Status: None (Preliminary result)   Collection Time: 04/08/23  6:11 AM   Specimen: BLOOD LEFT ARM  Result Value Ref Range Status   Specimen Description BLOOD LEFT ARM  Final   Special Requests   Final    BOTTLES DRAWN AEROBIC AND ANAEROBIC Blood Culture adequate volume   Culture   Final    NO GROWTH 1 DAY Performed at [facility], [address], [place], [place] [address]    Report Status PENDING  Incomplete    RADIOLOGY STUDIES/RESULTS: US RENAL  Result Date: 04/08/2023 CLINICAL DATA:  Acute renal injury EXAM: RENAL / URINARY TRACT ULTRASOUND COMPLETE COMPARISON:  CT from 12/19/2013 FINDINGS: Right Kidney: Renal measurements: 9.0 x 4.0 x 4.5 cm. = volume: 83.4 mL. Renal cysts are noted in the lower pole of the right kidney measuring up to 3.8 cm. These are simple in nature. No follow-up is recommended. Left Kidney: Renal measurements: 10.6 x 4.3 x 5.2 cm. = volume: 125.2 mL. Renal cysts are noted in the lower pole of the left  kidney. The largest of these measures 6.8 cm. These are also simple in nature. No further follow-up is recommended. Bladder: Appears normal for degree of bladder distention. Other: None. IMPRESSION: Bilateral simple cysts.  No other focal abnormality is noted. Electronically Signed   By: Alcide Clever M.D.   On: 04/08/2023 01:36   ECHOCARDIOGRAM COMPLETE  Result Date: 04/07/2023    ECHOCARDIOGRAM REPORT   Patient Name:   Tanner [name] Date of Exam: 04/07/2023 Medical Rec #:  [id number]      Height:       64.0 in Accession #:    [id number]     Weight:       222.2 lb Date of Birth:  [birth year]      BSA:          2.046 m Patient Age:    77 years       BP:           157/125 mmHg Patient Gender: M              HR:           73 bpm. Exam Location:  Inpatient Procedure: 2D Echo, Cardiac Doppler, Color Doppler and Intracardiac            Opacification Agent Indications:    Bacteremia  History:        Patient has prior history of Echocardiogram examinations, most                 recent 08/19/2022. Risk Factors:Hypertension.  Sonographer:    [facility] Referring Phys: [id number] [facility]  Sonographer Comments: Suboptimal apical window and patient is obese. IMPRESSIONS  1. Left ventricular ejection fraction, by estimation, is 60 to 65%. The left ventricle has normal function. The left ventricle has no regional wall motion abnormalities. Left ventricular diastolic parameters were normal.  2. Right ventricular systolic function is normal. The right ventricular size is normal.  3. The mitral valve is normal in structure. No evidence of mitral valve regurgitation. No evidence of mitral stenosis.  4. The aortic valve is normal in structure. Aortic valve regurgitation is not visualized. No aortic stenosis is present.  5. The inferior vena cava is normal in size with greater than 50% respiratory variability, suggesting right atrial pressure of 3 mmHg.  6. No obvious vegetations identified; consider TEE if clinically indicated. FINDINGS   Left Ventricle: Left ventricular ejection fraction, by estimation,

## 2023-04-09 NOTE — Progress Notes (Signed)
Physical Therapy Treatment Patient Details Name: Tanner [name] MRN: [id number] DOB: 1945-08-18 Today's Date: 04/09/2023   History of Present Illness 77 y.o. male  admitted to [facility] on 04/06/2023 with cellulitis of the right lower extremity and generalized weakness. +fall, CT head and spine negative, 04/07/23 I&D with poly exchange Rt knee PMH includes R TKR 12/15/2022, L TKR 2022, anxiety, BPH, HF, depression, DMII, DVT on anticoagulation, HTN, PE, afib, CKDIII.    PT Comments  Pt received in chair with feet on floor, noted BLE edema especially in R foot. Larger socks obtained and pt agreeable to therapy session along with OT due to pt multidisciplinary therapy needs and decreased activity tolerance. Pt BP stable from sit>stand, still with BLE buckling as he fatigues, appears to be related more to deconditioning/BLE weakness rather than BP. Pt with somewhat improved insight into deficits this session, PTA reinforced with him need to build strength to ensure independence as he lives alone and his son works, pt receptive to discussion of SNF today. Worked on seated/standing BLE exercises for strengthening and pt able to initiate short gait trial at bedside with close chair follow and +2 minA for safety due to pt LE buckling with fatigue. Pt continues to benefit from PT services to progress toward functional mobility goals.     If plan is discharge home, recommend the following: Assistance with cooking/housework;Direct supervision/assist for medications management;Direct supervision/assist for financial management;Assist for transportation;Help with stairs or ramp for entrance;A lot of help with walking and/or transfers (+2 gait)   Can travel by private vehicle     No  Equipment Recommendations  None recommended by PT (continue to assess)    Recommendations for Other Services       Precautions / Restrictions Precautions Precautions: Fall Precaution Comments: multiple  trip/falls Restrictions Weight Bearing Restrictions: No     Mobility  Bed Mobility               General bed mobility comments: pt up in recliner on arrival and departure of staff    Transfers Overall transfer level: Needs assistance Equipment used: Rolling walker (2 wheels) Transfers: Sit to/from Stand Sit to Stand: Min assist, +2 safety/equipment           General transfer comment: STS x 4 from chair, cues each time for safe UE placement when sitting    Ambulation/Gait Ambulation/Gait assistance: Min assist, +2 safety/equipment Gait Distance (Feet): 8 Feet Assistive device: Rolling walker (2 wheels) Gait Pattern/deviations: Step-to pattern, Decreased stride length, Knee flexed in stance - right, Knee flexed in stance - left, Trunk flexed, Decreased dorsiflexion - right, Decreased dorsiflexion - left     Pre-gait activities: standing hip flexion x10 reps with seated break afterward prior to gait trial listed above General Gait Details: BP stable with standing BP assessment but pt fatigued after initial standing and pre-gait marching. On third attempt, pt taking low shuffled steps ~62ft prior to c/o fatigue and very close chair follow for safety throughout.   Stairs             Wheelchair Mobility     Tilt Bed    Modified Rankin (Stroke Patients Only)       Balance Overall balance assessment: Needs assistance Sitting-balance support: No upper extremity supported, Feet supported Sitting balance-Leahy Scale:  (Fair to good) Sitting balance - Comments: forward in chair   Standing balance support: Bilateral upper extremity supported, During functional activity Standing balance-Leahy Scale: Poor Standing balance comment: Able to  stand with CGA to minA (+2 safety) while unsupported only 5-10 seconds prior to needing to grasp RW again, bil knees slowly bending when unsupported; Single UE reaching also only 1-2" outside BOS anterior/laterally a few seconds  prior to having to reach back to RW to prevent BLE buckling.                            Cognition Arousal: Alert Behavior During Therapy: WFL for tasks assessed/performed Overall Cognitive Status: Within Functional Limits for tasks assessed Area of Impairment: Safety/judgement, Problem solving                     Memory: Decreased short-term memory Following Commands: Follows one step commands consistently Safety/Judgement: Decreased awareness of safety, Decreased awareness of deficits   Problem Solving: Requires verbal cues General Comments: Pt needs reminders each time to reach back to chair prior to sitting; decreased awareness of his deficits, tends to buckle with fatigue, needs frequent check-ins to assess for activity tolerance while standing.        Exercises General Exercises - Lower Extremity Ankle Circles/Pumps: AROM, Both, Supine, 10 reps Long Arc Quad: AROM, Both, 10 reps, Seated Hip Flexion/Marching: AROM, Both, 10 reps, Standing Other Exercises Other Exercises: IS x 10 reps pt achieves [phone] encouraged hourly use    General Comments General comments (skin integrity, edema, etc.): BP/MAP stable with sit>stand transfer, DBP remains soft (low Diastolic appears to be his baseline). HR 70's-80's bpm with exertion. SpO2 noisy signal while standing due to RW grip, but appears WFL low to mid 90's when resting on RA. IS brought to his room. His bil feet appear very swollen around area where sock  meets ankle, larger socks brought to room and PTA reinforced importance of keeping BLE (especially RLE) elevated when resting to prevent fluid pooling in his lower extremities. Pt reports some discomfort with his legs reclined but able to tolerate. Pillow to float his heels, reinforced with pt not to put pillow under his R knee post-op to prevent knee contracture, esp as pt tends to keep knees flexed in stance already. Some odor in room from his BLE wounds.       Pertinent Vitals/Pain Pain Assessment Pain Assessment: Faces Faces Pain Scale: Hurts little more Pain Location: BLEs (R>L), tailbone from sitting in chair Pain Descriptors / Indicators: Sore, Grimacing, Discomfort Pain Intervention(s): Monitored during session, Repositioned, Ice applied (ice to R knee post-acute)    Home Living                          Prior Function            PT Goals (current goals can now be found in the care plan section) Acute Rehab PT Goals Patient Stated Goal: to feel stronger so I can go home by myself (pt lives alone) PT Goal Formulation: With patient Time For Goal Achievement: 04/22/23 Progress towards PT goals: Progressing toward goals    Frequency    Min 1X/week      PT Plan      Co-evaluation PT/OT/SLP Co-Evaluation/Treatment: Yes Reason for Co-Treatment: For patient/therapist safety;To address functional/ADL transfers PT goals addressed during session: Mobility/safety with mobility;Balance;Proper use of DME;Strengthening/ROM        AM-PAC PT "6 Clicks" Mobility   Outcome Measure  Help needed turning from your back to your side while in a flat bed without using bedrails?: A  Little Help needed moving from lying on your back to sitting on the side of a flat bed without using bedrails?: A Little Help needed moving to and from a bed to a chair (including a wheelchair)?: A Little Help needed standing up from a chair using your arms (e.g., wheelchair or bedside chair)?: A Lot (+2 safety) Help needed to walk in hospital room?: Total Help needed climbing 3-5 steps with a railing? : Total 6 Click Score: 13    End of Session Equipment Utilized During Treatment: Gait belt Activity Tolerance: Patient tolerated treatment well;Patient limited by fatigue Patient left: in chair;with call bell/phone within reach;with chair alarm set;Other (comment) (BLE elevated in recliner) Nurse Communication: Mobility status PT Visit Diagnosis:  Other abnormalities of gait and mobility (R26.89);Muscle weakness (generalized) (M62.81);Repeated falls (R29.6)     Time: 3295-1884 PT Time Calculation (min) (ACUTE ONLY): 28 min  Charges:    $Therapeutic Exercise: 8-22 mins PT General Charges $$ ACUTE PT VISIT: 1 Visit                     Tanner [name] P., PTA Acute Rehabilitation Services Secure Chat Preferred 9a-5:30pm Office: [phone]    Dorathy Kinsman [facility] 04/09/2023, 1:54 PM

## 2023-04-09 NOTE — NC FL2 (Signed)
[place] MEDICAID FL2 LEVEL OF CARE FORM     IDENTIFICATION  Patient Name: Tanner [name] Birthdate: 1945/09/22 Sex: male Admission Date (Current Location): 04/06/2023  [facility] and [place] Number:  Producer, television/film/video and Address:  [facility] [place], [address] [place], [place] [address]      Provider Number: [id number]  Attending Physician Name and Address:  Maretta Bees, MD  Relative Name and Phone Number:       Current Level of Care: SNF Recommended Level of Care: Skilled Nursing Facility Prior Approval Number:    Date Approved/Denied:   PASRR Number: [id number]  Discharge Plan: SNF    Current Diagnoses: Patient Active Problem List   Diagnosis Date Noted   Generalized weakness 04/07/2023   Fall at home, initial encounter 04/07/2023   Acute on chronic anemia 04/07/2023   Hyperlipidemia 04/07/2023   MRSA bacteremia 04/07/2023   Enterococcal bacteremia 04/07/2023   Streptococcal bacteremia 04/07/2023   Infection of prosthetic right knee joint (HCC) 04/06/2023   Aspiration pneumonia (HCC) 03/18/2023   Pressure injury of skin 03/18/2023   Severe sepsis (HCC) 03/18/2023   S/P total knee arthroplasty, right 12/15/2022   Acute respiratory failure with hypoxemia (HCC) 10/22/2022   Paroxysmal atrial fibrillation (HCC) 08/19/2022   Jaw pain 06/24/2022   Left hand pain 05/29/2022   Chronic systolic CHF (congestive heart failure) (HCC) 02/23/2022   Moderate mitral regurgitation 02/23/2022   Leg swelling 01/29/2022   ED (erectile dysfunction) 01/13/2021   Cyst, dermoid, scalp and neck 01/13/2021   Chronic kidney disease (CKD), stage III (moderate) (HCC) 12/13/2019   Carpal tunnel syndrome 12/05/2019   Elevated creatine kinase 11/15/2019   Aortic atherosclerosis (HCC) 11/15/2019   Recurrent Clostridium difficile diarrhea 05/28/2017   Peripheral neuropathy 03/17/2017   DM2 (diabetes mellitus, type 2) (HCC) 12/11/2016   Allergic rhinitis  12/11/2016   Degenerative arthritis of left knee 01/30/2016   Morbid obesity (HCC) 01/30/2016   Routine general medical examination at a health care facility 04/20/2014   History of pulmonary embolus (PE) 12/19/2013   Acute renal failure superimposed on stage 3b chronic kidney disease (HCC) 12/19/2013   Anxiety state 03/29/2007   Depression 03/29/2007   OSA (obstructive sleep apnea) 03/29/2007   Unspecified glaucoma 03/29/2007   BPH (benign prostatic hyperplasia) 03/29/2007   OA (osteoarthritis) of knee 03/29/2007   Essential hypertension 03/29/2007   History of Bell's palsy 03/29/2007    Orientation RESPIRATION BLADDER Height & Weight     Time, Situation, Self, Place  Normal Continent Weight: 220 lb 7.4 oz (100 kg) Height:  5\' 4"  (162.6 cm)  BEHAVIORAL SYMPTOMS/MOOD NEUROLOGICAL BOWEL NUTRITION STATUS      Continent Diet (See dc summary)  AMBULATORY STATUS COMMUNICATION OF NEEDS Skin   Limited Assist Verbally Other (Comment), Skin abrasions, PU Stage and Appropriate Care (Abraision locations - Buttocks, Knee ; PU Stage 2 - dressing change PRN, dressing type foam)                       Personal Care Assistance Level of Assistance  Bathing, Feeding, Dressing Bathing Assistance: Maximum assistance Feeding assistance: Independent Dressing Assistance: Maximum assistance     Functional Limitations Info  Sight, Hearing, Speech Sight Info: Adequate Hearing Info: Adequate Speech Info: Adequate    SPECIAL CARE FACTORS FREQUENCY  OT (By licensed OT), PT (By licensed PT)     PT Frequency: 5x week OT Frequency: 5x week  Contractures Contractures Info: Not present    Additional Factors Info  Code Status, Allergies, Psychotropic Code Status Info: Full Allergies Info: Lisinopril, Amlodipine, Codeine Sulfate Psychotropic Info: Wellbutrin         Current Medications (04/09/2023):  This is the current hospital active medication list Current  Facility-Administered Medications  Medication Dose Route Frequency Provider Last Rate Last Admin   0.9 %  sodium chloride infusion   Intravenous Continuous Ghimire, Werner Lean, MD       acetaminophen (TYLENOL) tablet 650 mg  650 mg Oral Q6H Cassandria Anger, PA-C   650 mg at 04/09/23 4696   alum & mag hydroxide-simeth (MAALOX/MYLANTA) 200-200-20 MG/5ML suspension 30 mL  30 mL Oral Q4H PRN Cassandria Anger, PA-C       apixaban Everlene Balls) tablet 5 mg  5 mg Oral BID Cassandria Anger, PA-C   5 mg at 04/08/23 2217   bisacodyl (DULCOLAX) suppository 10 mg  10 mg Rectal Daily PRN Cassandria Anger, PA-C       cefTRIAXone (ROCEPHIN) 2 g in sodium chloride 0.9 % 100 mL IVPB  2 g Intravenous Q24H Cassandria Anger, PA-C 200 mL/hr at 04/08/23 1745 2 g at 04/08/23 1745   DAPTOmycin (CUBICIN) 800 mg in sodium chloride 0.9 % IVPB  10 mg/kg (Adjusted) Intravenous Q48H Ann Held, RPH 132 mL/hr at 04/08/23 1644 800 mg at 04/08/23 1644   diphenhydrAMINE (BENADRYL) 12.5 MG/5ML elixir 12.5-25 mg  12.5-25 mg Oral Q4H PRN Cassandria Anger, PA-C       HYDROmorphone (DILAUDID) injection 0.5-1 mg  0.5-1 mg Intravenous Q4H PRN Cassandria Anger, PA-C       insulin aspart (novoLOG) injection 0-6 Units  0-6 Units Subcutaneous TID WC Doristine Counter, RPH   1 Units at 04/08/23 1831   lactated ringers infusion   Intravenous Continuous Cassandria Anger, PA-C       melatonin tablet 3 mg  3 mg Oral QHS PRN Cassandria Anger, PA-C       menthol-cetylpyridinium (CEPACOL) lozenge 3 mg  1 lozenge Oral PRN Cassandria Anger, PA-C       Or   phenol (CHLORASEPTIC) mouth spray 1 spray  1 spray Mouth/Throat PRN Cassandria Anger, PA-C       methocarbamol (ROBAXIN) tablet 500 mg  500 mg Oral Q6H PRN Cassandria Anger, PA-C       Or   methocarbamol (ROBAXIN) injection 500 mg  500 mg Intravenous Q6H PRN Cassandria Anger, PA-C       metoCLOPramide (REGLAN) tablet 5-10 mg  5-10 mg Oral Q8H PRN Cassandria Anger, PA-C       Or    metoCLOPramide (REGLAN) injection 5-10 mg  5-10 mg Intravenous Q8H PRN Cassandria Anger, PA-C       naloxone [facility]) injection 0.4 mg  0.4 mg Intravenous PRN Cassandria Anger, PA-C       ondansetron [facility]) tablet 4 mg  4 mg Oral Q6H PRN Cassandria Anger, PA-C       Or   ondansetron [facility]) injection 4 mg  4 mg Intravenous Q6H PRN Cassandria Anger, PA-C       oxyCODONE (Oxy IR/ROXICODONE) immediate release tablet 2.5-5 mg  2.5-5 mg Oral Q4H PRN Cassandria Anger, PA-C       oxyCODONE (Oxy IR/ROXICODONE) immediate release tablet 5-10 mg  5-10 mg Oral Q4H PRN Rosalene Billings R, PA-C       polyethylene glycol (MIRALAX /  GLYCOLAX) packet 17 g  17 g Oral BID Cassandria Anger, PA-C   17 g at 04/08/23 2217   senna (SENOKOT) tablet 17.2 mg  2 tablet Oral QHS Cassandria Anger, PA-C   17.2 mg at 04/08/23 2216     Discharge Medications: Please see discharge summary for a list of discharge medications.  Relevant Imaging Results:  Relevant Lab Results:   Additional Information SSN: [id number]. Use [employer] Medicare Benefit; Will likely need 6 weeks daptomycin  Michaela Corner, LCSWA

## 2023-04-09 NOTE — Plan of Care (Signed)
Pt has rested quietly throughout the night with no distress noted. Alert and oriented. [id number]. NS at 50. SR on the monitor. Voids per urinal. On round the clock tylenol. No requests for further meds. Dressing to right leg CDI. No complaints voiced.     Problem: Education: Goal: Ability to describe self-care measures that may prevent or decrease complications (Diabetes Survival Skills Education) will improve Outcome: Progressing Goal: Individualized Educational Video(s) Outcome: Progressing   Problem: Coping: Goal: Ability to adjust to condition or change in health will improve Outcome: Progressing   Problem: Fluid Volume: Goal: Ability to maintain a balanced intake and output will improve Outcome: Progressing   Problem: Health Behavior/Discharge Planning: Goal: Ability to identify and utilize available resources and services will improve Outcome: Progressing Goal: Ability to manage health-related needs will improve Outcome: Progressing   Problem: Metabolic: Goal: Ability to maintain appropriate glucose levels will improve Outcome: Progressing   Problem: Nutritional: Goal: Maintenance of adequate nutrition will improve Outcome: Progressing Goal: Progress toward achieving an optimal weight will improve Outcome: Progressing   Problem: Skin Integrity: Goal: Risk for impaired skin integrity will decrease Outcome: Progressing   Problem: Tissue Perfusion: Goal: Adequacy of tissue perfusion will improve Outcome: Progressing   Problem: Education: Goal: Knowledge of General Education information will improve Description: Including pain rating scale, medication(s)/side effects and non-pharmacologic comfort measures Outcome: Progressing   Problem: Health Behavior/Discharge Planning: Goal: Ability to manage health-related needs will improve Outcome: Progressing   Problem: Clinical Measurements: Goal: Ability to maintain clinical measurements within normal limits will  improve Outcome: Progressing Goal: Will remain free from infection Outcome: Progressing Goal: Diagnostic test results will improve Outcome: Progressing Goal: Respiratory complications will improve Outcome: Progressing Goal: Cardiovascular complication will be avoided Outcome: Progressing   Problem: Activity: Goal: Risk for activity intolerance will decrease Outcome: Progressing   Problem: Nutrition: Goal: Adequate nutrition will be maintained Outcome: Progressing   Problem: Coping: Goal: Level of anxiety will decrease Outcome: Progressing   Problem: Elimination: Goal: Will not experience complications related to bowel motility Outcome: Progressing Goal: Will not experience complications related to urinary retention Outcome: Progressing   Problem: Pain Management: Goal: General experience of comfort will improve Outcome: Progressing   Problem: Safety: Goal: Ability to remain free from injury will improve Outcome: Progressing   Problem: Skin Integrity: Goal: Risk for impaired skin integrity will decrease Outcome: Progressing   Problem: Education: Goal: Knowledge of the prescribed therapeutic regimen will improve Outcome: Progressing Goal: Individualized Educational Video(s) Outcome: Progressing   Problem: Activity: Goal: Ability to avoid complications of mobility impairment will improve Outcome: Progressing Goal: Range of joint motion will improve Outcome: Progressing   Problem: Clinical Measurements: Goal: Postoperative complications will be avoided or minimized Outcome: Progressing   Problem: Pain Management: Goal: Pain level will decrease with appropriate interventions Outcome: Progressing   Problem: Skin Integrity: Goal: Will show signs of wound healing Outcome: Progressing

## 2023-04-09 NOTE — Progress Notes (Signed)
   [facility] has been requested to perform a transesophageal echocardiogram on Tanner [name] for bacteremia.  After careful review of history and examination, the risks and benefits of transesophageal echocardiogram have been explained including risks of esophageal damage, perforation (1:10,000 risk), bleeding, pharyngeal hematoma as well as other potential complications associated with conscious sedation including aspiration, arrhythmia, respiratory failure and death. Alternatives to treatment were discussed, questions were answered. Patient is willing to proceed.   Procedure is scheduled for 04/12/2023 at 8:15am with Dr. Zoila Shutter. Will make NPO at midnight the night before and place preprocedural orders.  Of note, hemoglobin is 7.1 today (down from 12.4 on 04/03/2023). Per review of primary team notes, this is felt to be due to acute illness and AKI. I will make sure he has daily CBC so we can ensure this is stable prior to procedure.  Corrin Parker, PA-C  04/09/2023 2:02 PM

## 2023-04-09 NOTE — Progress Notes (Signed)
Occupational Therapy Treatment Patient Details Name: Tanner [name] MRN: [id number] DOB: [birth year] Today's Date: 04/09/2023   History of present illness 77 y.o. male  admitted to [facility] on 04/06/2023 with cellulitis of the right lower extremity and generalized weakness. +fall, CT head and spine negative, 04/07/23 I&D with poly exchange Rt knee PMH includes R TKR 12/15/2022, L TKR 2022, anxiety, BPH, HF, depression, DMII, DVT on anticoagulation, HTN, PE, afib, CKDIII.   OT comments  Pt continues to have weakness in BLEs which buckle with fatigue, he has decreased activity tolerance and needs multiple rest breaks to minimize fatigue throughout session. Simulated standing ADLs but due to buckling his balance needed min to mod A to maintain. Reinforced use of ice to reduce onset of knee pain following work with rehab team. Recommend pt have +2 assist when working with therapies due to high fall risk. OT to continue to progress pt as able, DC plans remain appropriate for SNF.      If plan is discharge home, recommend the following:  A lot of help with bathing/dressing/bathroom;Assistance with cooking/housework;A lot of help with walking and/or transfers;Help with stairs or ramp for entrance   Equipment Recommendations  None recommended by OT (Pt has rec DME, suggest his ramp gets built at home)    Recommendations for Other Services      Precautions / Restrictions Precautions Precautions: Fall Precaution Comments: multiple trip/falls Restrictions Weight Bearing Restrictions: No       Mobility Bed Mobility               General bed mobility comments: pt up in recliner on arrival and departure of staff    Transfers Overall transfer level: Needs assistance Equipment used: Rolling walker (2 wheels) Transfers: Sit to/from Stand Sit to Stand: Min assist, +2 safety/equipment           General transfer comment: STS x 4 from chair, cues each time for safe UE placement  when sitting. Rest breaks in between standing attempts     Balance Overall balance assessment: Needs assistance Sitting-balance support: No upper extremity supported, Feet supported Sitting balance-Leahy Scale: Fair Sitting balance - Comments: forward in chair   Standing balance support: Bilateral upper extremity supported, During functional activity Standing balance-Leahy Scale: Poor Standing balance comment: Able to stand with CGA to minA (+2 safety) while unsupported only 5-10 seconds prior to needing to grasp RW again, bil knees slowly bending when unsupported; Single UE reaching also only 1-2" outside BOS anterior/laterally a few seconds prior to having to reach back to RW to prevent BLE buckling.                           ADL either performed or assessed with clinical judgement   ADL       Grooming: Sitting;Set up;Wash/dry face Grooming Details (indicate cue type and reason): simulated grooming in standing pt needing min to mod A as his BLEs begin to slowly buckle             Lower Body Dressing: Maximal assistance;Sitting/lateral leans Lower Body Dressing Details (indicate cue type and reason): donning socks               General ADL Comments: Dynamic reaching while standing, min A to maintain balance while performing reaching tasks    Extremity/Trunk Assessment              Vision       Perception  Praxis      Cognition Arousal: Alert Behavior During Therapy: WFL for tasks assessed/performed Overall Cognitive Status: Impaired/Different from baseline Area of Impairment: Safety/judgement, Problem solving                               General Comments: Pt needs reminders each time to reach back to chair prior to sitting; decreased awareness of his deficits, tends to buckle with fatigue, needs frequent check-ins to assess for activity tolerance while standing.        Exercises General Exercises - Lower Extremity Ankle  Circles/Pumps: AROM, Both, Supine, 10 reps Long Arc Quad: AROM, Both, 10 reps, Seated Hip Flexion/Marching: AROM, Both, 10 reps, Standing    Shoulder Instructions       General Comments BP/MAP stable with sit>stand transfer, DBP remains soft (low Diastolic appears to be his baseline). HR 70's-80's bpm with exertion. SpO2 noisy signal while standing due to RW grip, but appears WFL low to mid 90's when resting on RA. IS brought to his room, ice applied to R knee    Pertinent Vitals/ Pain       Pain Assessment Pain Assessment: Faces Faces Pain Scale: Hurts little more Pain Location: BLEs (R>L), tailbone from sitting in chair Pain Descriptors / Indicators: Sore, Grimacing, Discomfort Pain Intervention(s): Monitored during session, Repositioned, Ice applied (ice to R knee post-acute)  Home Living                                          Prior Functioning/Environment              Frequency  Min 1X/week        Progress Toward Goals  OT Goals(current goals can now be found in the care plan section)  Progress towards OT goals: Progressing toward goals  Acute Rehab OT Goals Patient Stated Goal: To go home OT Goal Formulation: With patient Time For Goal Achievement: 04/22/23 Potential to Achieve Goals: Good  Plan      Co-evaluation      Reason for Co-Treatment: For patient/therapist safety;To address functional/ADL transfers PT goals addressed during session: Mobility/safety with mobility;Balance;Proper use of DME;Strengthening/ROM        AM-PAC OT "6 Clicks" Daily Activity     Outcome Measure   Help from another person eating meals?: None Help from another person taking care of personal grooming?: A Little Help from another person toileting, which includes using toliet, bedpan, or urinal?: A Lot Help from another person bathing (including washing, rinsing, drying)?: A Lot Help from another person to put on and taking off regular upper body  clothing?: A Little Help from another person to put on and taking off regular lower body clothing?: A Lot 6 Click Score: 16    End of Session Equipment Utilized During Treatment: Gait belt;Rolling walker (2 wheels)  OT Visit Diagnosis: Other abnormalities of gait and mobility (R26.89);Unsteadiness on feet (R26.81)   Activity Tolerance Patient tolerated treatment well   Patient Left with call bell/phone within reach;in chair;with chair alarm set   Nurse Communication Mobility status (buckles with fatigue)        Time: 1300-1326 OT Time Calculation (min): 26 min  Charges: OT General Charges $OT Visit: 1 Visit OT Treatments $Therapeutic Activity: 8-22 mins  04/09/2023  AB, OTR/L  Acute Rehabilitation Services  Office: [phone]  Tristan Schroeder 04/09/2023, 2:29 PM

## 2023-04-09 NOTE — TOC Initial Note (Signed)
Transition of Care [facility]) - Initial/Assessment Note    Patient Details  Name: Tanner [name] MRN: [id number] Date of Birth: 12-Oct-1945  [facility]) CM/SW Contact:    Michaela Corner, LCSWA Phone Number: 04/09/2023, 9:53 AM  Clinical Narrative:                  CSW spoke with pts son, Luisa Hart, in regards to dc planning. Luisa Hart stated his father left [facility] due to them giving him a shot while sleeping, pt woke up upset with the facility staff for giving him a shot while sleeping. Luisa Hart stated pt lives alone and is able to make meals for himself, but would benefit from going to a SNF. Luisa Hart requested CSW look into [facility] - snf for his father. CSW completed FL2 and pt has active PASRR [id number].   TOC will continue to follow.   Expected Discharge Plan: Skilled Nursing Facility Barriers to Discharge: Continued Medical Work up, English as a second language teacher   Patient Goals and CMS Choice     Choice offered to / list presented to : Adult Children      Expected Discharge Plan and Services In-house Referral: Clinical Social Work     Living arrangements for the past 2 months: Single Family Home                                      Prior Living Arrangements/Services Living arrangements for the past 2 months: Single Family Home Lives with:: Self Patient language and need for interpreter reviewed:: Yes        Need for Family Participation in Patient Care: Yes (Comment) Care giver support system in place?: Yes (comment) Current home services: DME (Walker, cane) Criminal Activity/Legal Involvement Pertinent to Current Situation/Hospitalization: No - Comment as needed  Activities of Daily Living   ADL Screening (condition at time of admission) Independently performs ADLs?: No Does the patient have a NEW difficulty with bathing/dressing/toileting/self-feeding that is expected to last >3 days?: Yes (Initiates electronic notice to provider for possible OT  consult) Does the patient have a NEW difficulty with getting in/out of bed, walking, or climbing stairs that is expected to last >3 days?: Yes (Initiates electronic notice to provider for possible PT consult) Does the patient have a NEW difficulty with communication that is expected to last >3 days?: No Is the patient deaf or have difficulty hearing?: No Does the patient have difficulty seeing, even when wearing glasses/contacts?: No Does the patient have difficulty concentrating, remembering, or making decisions?: No  Permission Sought/Granted Permission sought to share information with : Family Supports Permission granted to share information with : Yes, Verbal Permission Granted  Share Information with NAME: Luisa Hart  Permission granted to share info w AGENCY: SNFs  Permission granted to share info w Relationship: Son  Permission granted to share info w Contact Information: [phone]  Emotional Assessment Appearance:: Appears stated age Attitude/Demeanor/Rapport: Unable to Assess Affect (typically observed): Unable to Assess Orientation: : Oriented to Self, Oriented to Place, Oriented to  Time, Oriented to Situation Alcohol / Substance Use: Not Applicable Psych Involvement: No (comment)  Admission diagnosis:  Abscess of knee [L02.419] Cellulitis of right knee [L03.115] Cellulitis of right lower extremity [L03.115] Generalized weakness [R53.1] Frequent falls [R29.6] Leukocytosis, unspecified type [D72.829] Patient Active Problem List   Diagnosis Date Noted   Generalized weakness 04/07/2023   Fall at home, initial encounter 04/07/2023   Acute  on chronic anemia 04/07/2023   Hyperlipidemia 04/07/2023   MRSA bacteremia 04/07/2023   Enterococcal bacteremia 04/07/2023   Streptococcal bacteremia 04/07/2023   Infection of prosthetic right knee joint (HCC) 04/06/2023   Aspiration pneumonia (HCC) 03/18/2023   Pressure injury of skin 03/18/2023   Severe sepsis (HCC) 03/18/2023    S/P total knee arthroplasty, right 12/15/2022   Acute respiratory failure with hypoxemia (HCC) 10/22/2022   Paroxysmal atrial fibrillation (HCC) 08/19/2022   Jaw pain 06/24/2022   Left hand pain 05/29/2022   Chronic systolic CHF (congestive heart failure) (HCC) 02/23/2022   Moderate mitral regurgitation 02/23/2022   Leg swelling 01/29/2022   ED (erectile dysfunction) 01/13/2021   Cyst, dermoid, scalp and neck 01/13/2021   Chronic kidney disease (CKD), stage III (moderate) (HCC) 12/13/2019   Carpal tunnel syndrome 12/05/2019   Elevated creatine kinase 11/15/2019   Aortic atherosclerosis (HCC) 11/15/2019   Recurrent Clostridium difficile diarrhea 05/28/2017   Peripheral neuropathy 03/17/2017   DM2 (diabetes mellitus, type 2) (HCC) 12/11/2016   Allergic rhinitis 12/11/2016   Degenerative arthritis of left knee 01/30/2016   Morbid obesity (HCC) 01/30/2016   Routine general medical examination at a health care facility 04/20/2014   History of pulmonary embolus (PE) 12/19/2013   Acute renal failure superimposed on stage 3b chronic kidney disease (HCC) 12/19/2013   Anxiety state 03/29/2007   Depression 03/29/2007   OSA (obstructive sleep apnea) 03/29/2007   Unspecified glaucoma 03/29/2007   BPH (benign prostatic hyperplasia) 03/29/2007   OA (osteoarthritis) of knee 03/29/2007   Essential hypertension 03/29/2007   History of Bell's palsy 03/29/2007   PCP:  Administration, [facility]:   [id number] [place], [place] - [address] [place] [address] Phone: [phone] Fax: [phone]  [facility] [address] [place] [address] Phone: [phone] Fax: [phone]  [id number] [place], [place] - [address] AT [facility] & [address] [place] [address] Phone: [phone] Fax: [phone]     Social Determinants of Health  (SDOH) Social History: SDOH Screenings   Food Insecurity: No Food Insecurity (04/07/2023)  Housing: Low Risk  (04/07/2023)  Transportation Needs: No Transportation Needs (04/07/2023)  Utilities: Not At Risk (04/07/2023)  Alcohol Screen: Low Risk  (10/27/2022)  Depression (PHQ2-9): Low Risk  (01/04/2023)  Financial Resource Strain: Low Risk  (10/27/2022)  Physical Activity: Inactive (10/27/2022)  Social Connections: Moderately Integrated (10/27/2022)  Stress: No Stress Concern Present (12/31/2022)  Recent Concern: Stress - Stress Concern Present (10/30/2022)  Tobacco Use: Medium Risk (04/07/2023)   SDOH Interventions:     Readmission Risk Interventions    08/20/2022   11:10 AM  Readmission Risk Prevention Plan  Transportation Screening Complete  PCP or Specialist Appt within 3-5 Days Complete  HRI or Home Care Consult Complete  Palliative Care Screening Not Applicable  Medication Review (RN Care Manager) Complete

## 2023-04-09 NOTE — Nursing Note (Signed)
Behavioral Health Integration--Brief Note  BHI Enrollment Date Patient was enrolled in BHI on  .     PCP Rhoderick Moody, DO   Collaborating Psychiatrist: No care team member to display  Behavioral Health Case Manager: Tasia Catchings, MSW        MSW spoke with patient this date via telephone. MSW explained BHI more in depth. Patient said he is not currently interested in medication, but is interested in linkage with additional support for seasonal depression. Patient is interested in BHI. MSW and patient scheduled initial assessments for next week, and encouraged communication with MSW if any questions arise before that time.   Total BHI time spent this encounter is 6 minutes.    Tasia Catchings, MSW  04/09/2023, 11:25  Behavioral Health Case Manager

## 2023-04-10 DIAGNOSIS — I480 Paroxysmal atrial fibrillation: Secondary | ICD-10-CM | POA: Diagnosis not present

## 2023-04-10 DIAGNOSIS — L02419 Cutaneous abscess of limb, unspecified: Secondary | ICD-10-CM | POA: Diagnosis not present

## 2023-04-10 DIAGNOSIS — R7881 Bacteremia: Secondary | ICD-10-CM | POA: Diagnosis not present

## 2023-04-10 DIAGNOSIS — N170 Acute kidney failure with tubular necrosis: Secondary | ICD-10-CM | POA: Diagnosis not present

## 2023-04-10 LAB — CULTURE, BLOOD (ROUTINE X 2): Special Requests: ADEQUATE

## 2023-04-10 LAB — CBC
HCT: 22.7 % — ABNORMAL LOW (ref 39.0–52.0)
Hemoglobin: 7 g/dL — ABNORMAL LOW (ref 13.0–17.0)
MCH: 24.4 pg — ABNORMAL LOW (ref 26.0–34.0)
MCHC: 30.8 g/dL (ref 30.0–36.0)
MCV: 79.1 fL — ABNORMAL LOW (ref 80.0–100.0)
Platelets: 300 10*3/uL (ref 150–400)
RBC: 2.87 MIL/uL — ABNORMAL LOW (ref 4.22–5.81)
RDW: 18.2 % — ABNORMAL HIGH (ref 11.5–15.5)
WBC: 10.2 10*3/uL (ref 4.0–10.5)
nRBC: 0 % (ref 0.0–0.2)

## 2023-04-10 LAB — BASIC METABOLIC PANEL
Anion gap: 10 (ref 5–15)
BUN: 54 mg/dL — ABNORMAL HIGH (ref 8–23)
CO2: 23 mmol/L (ref 22–32)
Calcium: 8.2 mg/dL — ABNORMAL LOW (ref 8.9–10.3)
Chloride: 106 mmol/L (ref 98–111)
Creatinine, Ser: 2.79 mg/dL — ABNORMAL HIGH (ref 0.61–1.24)
GFR, Estimated: 22 mL/min — ABNORMAL LOW (ref >60)
Glucose, Bld: 109 mg/dL — ABNORMAL HIGH (ref 70–99)
Potassium: 4.3 mmol/L (ref 3.5–5.1)
Sodium: 139 mmol/L (ref 135–145)

## 2023-04-10 LAB — GLUCOSE, CAPILLARY
Glucose-Capillary: 79 mg/dL (ref 70–99)
Glucose-Capillary: 93 mg/dL (ref 70–99)
Glucose-Capillary: 114 mg/dL — ABNORMAL HIGH (ref 70–99)
Glucose-Capillary: 141 mg/dL — ABNORMAL HIGH (ref 70–99)

## 2023-04-10 LAB — PREPARE RBC (CROSSMATCH)

## 2023-04-10 LAB — CK: Total CK: 25 U/L — ABNORMAL LOW (ref 49–397)

## 2023-04-10 MED ORDER — PANTOPRAZOLE SODIUM 40 MG PO TBEC
40.0000 mg | DELAYED_RELEASE_TABLET | Freq: Every day | ORAL | Status: DC
  Administered 2023-04-10 – 2023-04-14 (×5): 40 mg via ORAL
  Filled 2023-04-10 (×5): qty 1

## 2023-04-10 MED ORDER — ACETAMINOPHEN 325 MG PO TABS
650.0000 mg | ORAL_TABLET | Freq: Once | ORAL | Status: AC
  Administered 2023-04-10: 650 mg via ORAL
  Filled 2023-04-10: qty 2

## 2023-04-10 MED ORDER — SODIUM CHLORIDE 0.9% IV SOLUTION: Freq: Once | INTRAVENOUS | Status: AC

## 2023-04-10 MED ORDER — DIPHENHYDRAMINE HCL 25 MG PO CAPS
25.0000 mg | ORAL_CAPSULE | Freq: Once | ORAL | Status: AC
  Administered 2023-04-10: 25 mg via ORAL
  Filled 2023-04-10: qty 1

## 2023-04-10 NOTE — Progress Notes (Signed)
Pharmacy Antibiotic Note  Tanner [name] is a 77 y.o. male admitted on 04/06/2023 with bacteremia.  Pharmacy has been consulted for daptomycin dosing.   Renal function slowly improving, eCrCl 23.3 mL/min.  Plan: Continue daptomycin 800 mg (10 mg/kg adjBW) every 48 hours  Continue ceftriaxone 2 g every 24 hours per MD Trend cultures, renal function, and overall clinical picture  Height: 5\' 4"  (162.6 cm) Weight: 100 kg (220 lb 7.4 oz) IBW/kg (Calculated) : 59.2  Temp (24hrs), Avg:97.9 F (36.6 C), Min:97.4 F (36.3 C), Max:98.7 F (37.1 C)  Recent Labs  Lab 04/06/23 1400 04/06/23 1513 04/06/23 1540 04/07/23 0006 04/08/23 0609 04/08/23 1051 04/09/23 0355 04/10/23 0450  WBC 20.8*  --   --  14.4* 11.0*  --  14.4* 10.2  CREATININE  --   --    < > 4.64* 4.12* 3.97* 3.50* 2.79*  LATICACIDVEN  --  1.7  --   --   --   --   --   --    < > = values in this interval not displayed.    Estimated Creatinine Clearance: 23.3 mL/min (A) (by C-G formula based on SCr of 2.79 mg/dL (H)).    Allergies  Allergen Reactions   Lisinopril Other (See Comments)     Renal impairment   Amlodipine Swelling   Codeine Sulfate Itching and Nausea Only    Antimicrobials this admission: 10/22 Vancomycin >> 10/23 10/24 Ceftriaxone >>  10/24 Daptomycin >>  Microbiology results: 10/22 BCx: MRSA and E. Faecalis  10/22 superficial knee swab: rare GNRs , MRSA 10/23 knee tissue cx: staph aureus, reincubating 10/24 Bcx ngtd2  Thank you for allowing pharmacy to be a part of this patient's care.  Nicole Kindred, PharmD PGY1 Pharmacy Resident 04/10/2023 1:17 PM

## 2023-04-10 NOTE — Plan of Care (Signed)
  Problem: Education: Goal: Ability to describe self-care measures that may prevent or decrease complications (Diabetes Survival Skills Education) will improve Outcome: Progressing Goal: Individualized Educational Video(s) Outcome: Progressing   Problem: Coping: Goal: Ability to adjust to condition or change in health will improve Outcome: Progressing   Problem: Fluid Volume: Goal: Ability to maintain a balanced intake and output will improve Outcome: Progressing   Problem: Health Behavior/Discharge Planning: Goal: Ability to identify and utilize available resources and services will improve Outcome: Progressing Goal: Ability to manage health-related needs will improve Outcome: Progressing   Problem: Metabolic: Goal: Ability to maintain appropriate glucose levels will improve Outcome: Progressing   Problem: Nutritional: Goal: Maintenance of adequate nutrition will improve Outcome: Progressing Goal: Progress toward achieving an optimal weight will improve Outcome: Progressing   Problem: Skin Integrity: Goal: Risk for impaired skin integrity will decrease Outcome: Progressing   Problem: Tissue Perfusion: Goal: Adequacy of tissue perfusion will improve Outcome: Progressing   Problem: Education: Goal: Knowledge of General Education information will improve Description: Including pain rating scale, medication(s)/side effects and non-pharmacologic comfort measures Outcome: Progressing   Problem: Health Behavior/Discharge Planning: Goal: Ability to manage health-related needs will improve Outcome: Progressing   Problem: Clinical Measurements: Goal: Ability to maintain clinical measurements within normal limits will improve Outcome: Progressing Goal: Will remain free from infection Outcome: Progressing Goal: Diagnostic test results will improve Outcome: Progressing Goal: Respiratory complications will improve Outcome: Progressing Goal: Cardiovascular complication will  be avoided Outcome: Progressing   Problem: Activity: Goal: Risk for activity intolerance will decrease Outcome: Progressing   Problem: Nutrition: Goal: Adequate nutrition will be maintained Outcome: Progressing   Problem: Coping: Goal: Level of anxiety will decrease Outcome: Progressing   Problem: Elimination: Goal: Will not experience complications related to bowel motility Outcome: Progressing Goal: Will not experience complications related to urinary retention Outcome: Progressing   Problem: Pain Management: Goal: General experience of comfort will improve Outcome: Progressing   Problem: Safety: Goal: Ability to remain free from injury will improve Outcome: Progressing   Problem: Skin Integrity: Goal: Risk for impaired skin integrity will decrease Outcome: Progressing   Problem: Education: Goal: Knowledge of the prescribed therapeutic regimen will improve Outcome: Progressing Goal: Individualized Educational Video(s) Outcome: Progressing   Problem: Activity: Goal: Ability to avoid complications of mobility impairment will improve Outcome: Progressing Goal: Range of joint motion will improve Outcome: Progressing   Problem: Clinical Measurements: Goal: Postoperative complications will be avoided or minimized Outcome: Progressing   Problem: Pain Management: Goal: Pain level will decrease with appropriate interventions Outcome: Progressing   Problem: Skin Integrity: Goal: Will show signs of wound healing Outcome: Progressing

## 2023-04-10 NOTE — Progress Notes (Signed)
PROGRESS NOTE        PATIENT DETAILS Name: Tanner [name] Age: 77 y.o. Sex: male Date of Birth: September 12, 1945 Admit Date: 04/06/2023 Admitting Physician Angie Fava, DO QMV:[id number], [facility]  Brief Summary: Patient is a 77 y.o.  male chronic HFrEF, DM-2, HTN, PAF on Eliquis-sent right knee arthroplasty 12/15/2022-presented with generalized weakness/ground-level fall-along with right knee discomfort for the past several days-upon further evaluation-he was found to have AKI with polymicrobial bacteremia-likely secondary to periprosthetic right knee infection (gram-negative rod on synovial fluid culture)  Significant events: 10/22>> admit to [facility]-I&D of right knee abscess by ED physician.  Significant studies: 3/6>> echo: EF 45-50% 10/23>> CXR: No PNA 10/23>> CT head: No acute intracranial abnormality 10/23>> CT C-spine: No fracture 10/23>> CT lumbar spine: No fracture 10/23>> x-ray right knee: Right knee arthroplasty without complication-joint effusion with subcutaneous edema anteriorly. 10/23>> renal ultrasound: No hydronephrosis. 1023>> echocardiogram: EF 60-65%, no obvious vegetations identified.  Significant microbiology data: 10/22>> blood culture: MRSA/Enterococcus faecalis 10/22>> superficial knee culture: MRSA 10/23>> intraoperative right knee culture: Staph aureus 10/24>> blood culture: Negative  Procedures: 10/22>> superficial right knee abscess I&D by ED physician 10/23>> excisional/nonexcisional debridement of right knee, polyethylene revision-Dr. Constance Goltz.  Consults: Infectious disease Orthopedics  Subjective: No complaints-had a bowel movement earlier this morning-he did not look at the color-discussed with nursing staff-night shift nursing staff did not mention any black or bloody stools.  Objective: Vitals: Blood pressure (!) 133/58, pulse 60, temperature 97.6 F (36.4 C), temperature source Oral, resp. rate 16, height 5\' 4"   (1.626 m), weight 100 kg, SpO2 93%.   Exam: Gen Exam:Alert awake-not in any distress HEENT:atraumatic, normocephalic Chest: B/L clear to auscultation anteriorly CVS:S1S2 regular Abdomen:soft non tender, non distended Extremities:no edema-right leg continues to be wrapped. Neurology: Non focal Skin: no rash  Pertinent Labs/Radiology:    Latest Ref Rng & Units 04/10/2023    4:50 AM 04/09/2023    3:55 AM 04/08/2023    6:09 AM  CBC  WBC 4.0 - 10.5 K/uL 10.2  14.4  11.0   Hemoglobin 13.0 - 17.0 g/dL 7.0  7.1  7.8   Hematocrit 39.0 - 52.0 % 22.7  22.9  25.8   Platelets 150 - 400 K/uL 300  304  311     Lab Results  Component Value Date   NA 139 04/10/2023   K 4.3 04/10/2023   CL 106 04/10/2023   CO2 23 04/10/2023      Assessment/Plan: AKI on CKD stage IIIb AKI likely hemodynamically mediated in the setting of bacteremia UA negative for proteinuria Renal ultrasound without hydronephrosis Renal function gradually improving with supportive care Avoid nephrotoxic agents Follow creatinine.  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-history of right knee total arthroplasty July 2024 Culture data as above Echo negative for vegetation Daptomycin/Rocephin Repeat blood cultures on 10/24 negative TEE scheduled for 10/28 Await further recommendations from infectious disease  Mechanical fall/generalized weakness Likely due to debility in the setting of AKI/bacteremia Imaging negative for fractures PT/OT eval likely will require SNF  Chronic HFrEF Euvolemic Hold diuretics/Jardiance due to AKI  PAF Telemetry monitoring Discussion with orthopedics on 10/24--Eliquis resumed  Normocytic anemia Due to acute illness/AKI-no indication of GI loss-had a BM overnight-not obviously bloody or melanotic Will transfuse 1 unit of PRBC today Continue to watch closely for any signs of overt GI bleeding.  Cautiously  continue with Eliquis.   DM-2 CBG  stable SSI  Recent Labs    04/09/23 2127 04/10/23 0811 04/10/23 1209  GLUCAP 141* 79 93    HTN BP stable Resume Coreg over the next several days  Obesity: Estimated body mass index is 37.84 kg/m as calculated from the following:   Height as of this encounter: 5\' 4"  (1.626 m).   Weight as of this encounter: 100 kg.   Code status:   Code Status: Full Code   DVT Prophylaxis: SCDs Start: 04/08/23 1038 Place TED hose Start: 04/08/23 1038 SCDs Start: 04/06/23 2154 apixaban (ELIQUIS) tablet 5 mg    Family Communication: Son-Patrick-[phone]-updated over the phone 10/23   Disposition Plan: Status is: Inpatient Remains inpatient appropriate because: Severity of illness   Planned Discharge Destination:Skilled nursing facility   Diet: Diet Order             Diet NPO time specified  Diet effective midnight           Diet regular Room service appropriate? Yes; Fluid consistency: Thin  Diet effective now                     Antimicrobial agents: Anti-infectives (From admission, onward)    Start     Dose/Rate Route Frequency Ordered Stop   04/09/23 1730  cefTRIAXone (ROCEPHIN) 2 g in sodium chloride 0.9 % 100 mL IVPB        2 g 200 mL/hr over 30 Minutes Intravenous Every 24 hours 04/09/23 1423     04/08/23 1445  DAPTOmycin (CUBICIN) 800 mg in sodium chloride 0.9 % IVPB        10 mg/kg  75.5 kg (Adjusted) 132 mL/hr over 30 Minutes Intravenous Every 48 hours 04/08/23 1346     04/08/23 1400  DAPTOmycin (CUBICIN) IVPB 700 mg/134mL premix  Status:  Discontinued        700 mg 200 mL/hr over 30 Minutes Intravenous Every 48 hours 04/07/23 1336 04/08/23 1346   04/07/23 1807  vancomycin (VANCOCIN) powder  Status:  Discontinued          As needed 04/07/23 1807 04/07/23 1905   04/07/23 1615  ceFAZolin (ANCEF) IVPB 2g/100 mL premix        2 g 200 mL/hr over 30 Minutes Intravenous On call to O.R. 04/07/23 1600 04/07/23 1735   04/07/23 1600  cefTRIAXone (ROCEPHIN) 1 g  in sodium chloride 0.9 % 100 mL IVPB  Status:  Discontinued        1 g 200 mL/hr over 30 Minutes Intravenous Every 24 hours 04/06/23 2159 04/07/23 0702   04/07/23 1600  cefTRIAXone (ROCEPHIN) 2 g in sodium chloride 0.9 % 100 mL IVPB  Status:  Discontinued        2 g 200 mL/hr over 30 Minutes Intravenous Every 24 hours 04/07/23 0702 04/09/23 1414   04/06/23 2208  vancomycin variable dose per unstable renal function (pharmacist dosing)  Status:  Discontinued         Does not apply See admin instructions 04/06/23 2209 04/07/23 1100   04/06/23 1615  vancomycin (VANCOREADY) IVPB 2000 mg/400 mL        2,000 mg 200 mL/hr over 120 Minutes Intravenous  Once 04/06/23 1606 04/06/23 1939   04/06/23 1600  cefTRIAXone (ROCEPHIN) 2 g in sodium chloride 0.9 % 100 mL IVPB        2 g 200 mL/hr over 30 Minutes Intravenous  Once 04/06/23 1558 04/06/23 1726  MEDICATIONS: Scheduled Meds:  acetaminophen  650 mg Oral Q6H   apixaban  5 mg Oral BID   insulin aspart  0-6 Units Subcutaneous TID WC   polyethylene glycol  17 g Oral BID   senna  2 tablet Oral QHS   Continuous Infusions:  cefTRIAXone (ROCEPHIN)  IV 2 g (04/09/23 1903)   DAPTOmycin 800 mg (04/08/23 1644)   PRN Meds:.alum & mag hydroxide-simeth, bisacodyl, diphenhydrAMINE, HYDROmorphone (DILAUDID) injection, melatonin, menthol-cetylpyridinium **OR** phenol, methocarbamol **OR** methocarbamol (ROBAXIN) injection, metoCLOPramide **OR** metoCLOPramide (REGLAN) injection, naLOXone (NARCAN)  injection, ondansetron **OR** ondansetron (ZOFRAN) IV, oxyCODONE, oxyCODONE   I have personally reviewed following labs and imaging studies  LABORATORY DATA: CBC: Recent Labs  Lab 04/06/23 1400 04/07/23 0006 04/08/23 0609 04/09/23 0355 04/10/23 0450  WBC 20.8* 14.4* 11.0* 14.4* 10.2  NEUTROABS 17.1* 12.2*  --   --   --   HGB 10.3* 8.6* 7.8* 7.1* 7.0*  HCT 34.3* 28.9* 25.8* 22.9* 22.7*  MCV 79.2* 79.8* 79.1* 79.0* 79.1*  PLT 389 306 311 304 300     Basic Metabolic Panel: Recent Labs  Lab 04/06/23 2047 04/07/23 0006 04/08/23 0609 04/08/23 1051 04/09/23 0355 04/10/23 0450  NA  --  135 137 138 135 139  K  --  3.9 4.7 4.8 4.2 4.3  CL  --  100 105 105 106 106  CO2  --  21* 23 21* 21* 23  GLUCOSE  --  89 155* 134* 152* 109*  BUN  --  54* 55* 55* 56* 54*  CREATININE  --  4.64* 4.12* 3.97* 3.50* 2.79*  CALCIUM  --  8.1* 8.3* 8.5* 8.2* 8.2*  MG 2.2 2.3  --   --   --   --     GFR: Estimated Creatinine Clearance: 23.3 mL/min (A) (by C-G formula based on SCr of 2.79 mg/dL (H)).  Liver Function Tests: Recent Labs  Lab 04/06/23 1540 04/07/23 0006 04/08/23 0609  AST 36 31 21  ALT 27 25 19   ALKPHOS 87 80 68  BILITOT 0.5 0.5 0.4  PROT 5.8* 5.0* 4.9*  ALBUMIN 2.5* 2.1* 1.8*   Recent Labs  Lab 04/06/23 1540  LIPASE 37   No results for input(s): "AMMONIA" in the last 168 hours.  Coagulation Profile: No results for input(s): "INR", "PROTIME" in the last 168 hours.  Cardiac Enzymes: Recent Labs  Lab 04/06/23 1540 04/10/23 0450  CKTOTAL 739* 25*    BNP (last 3 results) No results for input(s): "PROBNP" in the last 8760 hours.  Lipid Profile: No results for input(s): "CHOL", "HDL", "LDLCALC", "TRIG", "CHOLHDL", "LDLDIRECT" in the last 72 hours.  Thyroid Function Tests: No results for input(s): "TSH", "T4TOTAL", "FREET4", "T3FREE", "THYROIDAB" in the last 72 hours.   Anemia Panel: No results for input(s): "VITAMINB12", "FOLATE", "FERRITIN", "TIBC", "IRON", "RETICCTPCT" in the last 72 hours.   Urine analysis:    Component Value Date/Time   COLORURINE YELLOW 04/06/2023 1614   APPEARANCEUR CLEAR 04/06/2023 1614   LABSPEC 1.017 04/06/2023 1614   PHURINE 5.0 04/06/2023 1614   GLUCOSEU >=500 (A) 04/06/2023 1614   GLUCOSEU NEGATIVE 09/13/2007 1028   HGBUR NEGATIVE 04/06/2023 1614   BILIRUBINUR NEGATIVE 04/06/2023 1614   BILIRUBINUR negative 04/19/2015 1417   BILIRUBINUR neg 02/12/2013 1449   KETONESUR  NEGATIVE 04/06/2023 1614   PROTEINUR NEGATIVE 04/06/2023 1614   UROBILINOGEN 0.2 04/19/2015 1417   UROBILINOGEN 0.2 mg/dL 78/46/9629 5284   NITRITE NEGATIVE 04/06/2023 1614   LEUKOCYTESUR NEGATIVE 04/06/2023 1614    Sepsis Labs:  Lactic Acid, Venous    Component Value Date/Time   LATICACIDVEN 1.7 04/06/2023 1513    MICROBIOLOGY: Recent Results (from the past 240 hour(s))  Culture, blood (routine x 2)     Status: Abnormal   Collection Time: 04/06/23  3:04 PM   Specimen: BLOOD RIGHT ARM  Result Value Ref Range Status   Specimen Description BLOOD RIGHT ARM  Final   Special Requests   Final    BOTTLES DRAWN AEROBIC AND ANAEROBIC Blood Culture adequate volume   Culture  Setup Time   Final    GRAM POSITIVE COCCI IN PAIRS IN CHAINS IN BOTH AEROBIC AND ANAEROBIC BOTTLES CRITICAL RESULT CALLED TO, READ BACK BY AND VERIFIED WITH: PHARMD Gareth Eagle [phone] [id number] FCP Performed at [facility], [address], [place], [place] [address]    Culture (A)  Final    ENTEROCOCCUS FAECALIS METHICILLIN RESISTANT STAPHYLOCOCCUS AUREUS    Report Status 04/10/2023 FINAL  Final   Organism ID, Bacteria ENTEROCOCCUS FAECALIS  Final   Organism ID, Bacteria METHICILLIN RESISTANT STAPHYLOCOCCUS AUREUS  Final      Susceptibility   Enterococcus faecalis - MIC*    AMPICILLIN <=2 SENSITIVE Sensitive     VANCOMYCIN 2 SENSITIVE Sensitive     GENTAMICIN SYNERGY SENSITIVE Sensitive     * ENTEROCOCCUS FAECALIS   Methicillin resistant staphylococcus aureus - MIC*    CIPROFLOXACIN >=8 RESISTANT Resistant     ERYTHROMYCIN >=8 RESISTANT Resistant     GENTAMICIN <=0.5 SENSITIVE Sensitive     OXACILLIN >=4 RESISTANT Resistant     TETRACYCLINE <=1 SENSITIVE Sensitive     VANCOMYCIN <=0.5 SENSITIVE Sensitive     TRIMETH/SULFA <=10 SENSITIVE Sensitive     CLINDAMYCIN <=0.25 SENSITIVE Sensitive     RIFAMPIN <=0.5 SENSITIVE Sensitive     Inducible Clindamycin NEGATIVE Sensitive     LINEZOLID 2 SENSITIVE  Sensitive     * METHICILLIN RESISTANT STAPHYLOCOCCUS AUREUS  Blood Culture ID Panel (Reflexed)     Status: Abnormal   Collection Time: 04/06/23  3:04 PM  Result Value Ref Range Status   Enterococcus faecalis DETECTED (A) NOT DETECTED Final    Comment: CRITICAL RESULT CALLED TO, READ BACK BY AND VERIFIED WITH: PHARMD RACHEL G. [phone] FCP    Enterococcus Faecium NOT DETECTED NOT DETECTED Final   Listeria monocytogenes NOT DETECTED NOT DETECTED Final   Staphylococcus species DETECTED (A) NOT DETECTED Final    Comment: CRITICAL RESULT CALLED TO, READ BACK BY AND VERIFIED WITH: PHARMD RACHEL G. [id number] FCP    Staphylococcus aureus (BCID) DETECTED (A) NOT DETECTED Final    Comment: Methicillin (oxacillin)-resistant Staphylococcus aureus (MRSA). MRSA is predictably resistant to beta-lactam antibiotics (except ceftaroline). Preferred therapy is vancomycin unless clinically contraindicated. Patient requires contact precautions if  hospitalized. CRITICAL RESULT CALLED TO, READ BACK BY AND VERIFIED WITH: PHARMD RACHEL G. [phone] FCP    Staphylococcus epidermidis NOT DETECTED NOT DETECTED Final   Staphylococcus lugdunensis NOT DETECTED NOT DETECTED Final   Streptococcus species DETECTED (A) NOT DETECTED Final    Comment: Not Enterococcus species, Streptococcus agalactiae, Streptococcus pyogenes, or Streptococcus pneumoniae. CRITICAL RESULT CALLED TO, READ BACK BY AND VERIFIED WITH: PHARMD RACHEL G. [phone] FCP    Streptococcus agalactiae NOT DETECTED NOT DETECTED Final   Streptococcus pneumoniae NOT DETECTED NOT DETECTED Final   Streptococcus pyogenes NOT DETECTED NOT DETECTED Final   A.calcoaceticus-baumannii NOT DETECTED NOT DETECTED Final   Bacteroides fragilis NOT DETECTED NOT DETECTED Final   Enterobacterales  NOT DETECTED NOT DETECTED Final   Enterobacter cloacae complex NOT DETECTED NOT DETECTED Final   Escherichia coli NOT DETECTED NOT DETECTED Final   Klebsiella  aerogenes NOT DETECTED NOT DETECTED Final   Klebsiella oxytoca NOT DETECTED NOT DETECTED Final   Klebsiella pneumoniae NOT DETECTED NOT DETECTED Final   Proteus species NOT DETECTED NOT DETECTED Final   Salmonella species NOT DETECTED NOT DETECTED Final   Serratia marcescens NOT DETECTED NOT DETECTED Final   Haemophilus influenzae NOT DETECTED NOT DETECTED Final   Neisseria meningitidis NOT DETECTED NOT DETECTED Final   Pseudomonas aeruginosa NOT DETECTED NOT DETECTED Final   Stenotrophomonas maltophilia NOT DETECTED NOT DETECTED Final   Candida albicans NOT DETECTED NOT DETECTED Final   Candida auris NOT DETECTED NOT DETECTED Final   Candida glabrata NOT DETECTED NOT DETECTED Final   Candida krusei NOT DETECTED NOT DETECTED Final   Candida parapsilosis NOT DETECTED NOT DETECTED Final   Candida tropicalis NOT DETECTED NOT DETECTED Final   Cryptococcus neoformans/gattii NOT DETECTED NOT DETECTED Final   Meth resistant mecA/C and MREJ DETECTED (A) NOT DETECTED Final    Comment: CRITICAL RESULT CALLED TO, READ BACK BY AND VERIFIED WITH: PHARMD RACHEL G. [id number] FCP    Vancomycin resistance NOT DETECTED NOT DETECTED Final    Comment: Performed at [facility], [address] [place], [place] [address]  Culture, blood (routine x 2)     Status: None (Preliminary result)   Collection Time: 04/06/23  6:19 PM   Specimen: BLOOD RIGHT HAND  Result Value Ref Range Status   Specimen Description BLOOD RIGHT HAND  Final   Special Requests   Final    BOTTLES DRAWN AEROBIC ONLY Blood Culture results may not be optimal due to an inadequate volume of blood received in culture bottles   Culture   Final    NO GROWTH 4 DAYS Performed at [facility], [address], [place], [place] [address]    Report Status PENDING  Incomplete  Aerobic Culture w Gram Stain (superficial specimen)     Status: None   Collection Time: 04/06/23  8:49 PM   Specimen: KNEE  Result Value Ref Range Status    Specimen Description KNEE  Final   Special Requests NONE  Final   Gram Stain   Final    ABUNDANT WBC PRESENT, PREDOMINANTLY PMN RARE GRAM NEGATIVE RODS Performed at [facility], [address] [place], [place] [address]    Culture RARE METHICILLIN RESISTANT STAPHYLOCOCCUS AUREUS  Final   Report Status 04/09/2023 FINAL  Final   Organism ID, Bacteria METHICILLIN RESISTANT STAPHYLOCOCCUS AUREUS  Final      Susceptibility   Methicillin resistant staphylococcus aureus - MIC*    CIPROFLOXACIN >=8 RESISTANT Resistant     ERYTHROMYCIN >=8 RESISTANT Resistant     GENTAMICIN <=0.5 SENSITIVE Sensitive     OXACILLIN >=4 RESISTANT Resistant     TETRACYCLINE <=1 SENSITIVE Sensitive     VANCOMYCIN <=0.5 SENSITIVE Sensitive     TRIMETH/SULFA <=10 SENSITIVE Sensitive     CLINDAMYCIN <=0.25 SENSITIVE Sensitive     RIFAMPIN <=0.5 SENSITIVE Sensitive     Inducible Clindamycin NEGATIVE Sensitive     LINEZOLID 2 SENSITIVE Sensitive     * RARE METHICILLIN RESISTANT STAPHYLOCOCCUS AUREUS  Aerobic/Anaerobic Culture w Gram Stain (surgical/deep wound)     Status: None (Preliminary result)   Collection Time: 04/07/23  6:09 PM   Specimen: Wound; Tissue  Result Value Ref Range  Status   Specimen Description TISSUE RIGHT KNEE  Final   Special Requests NONE  Final   Gram Stain   Final    MODERATE WBC PRESENT, PREDOMINANTLY PMN NO ORGANISMS SEEN    Culture   Final    RARE STAPHYLOCOCCUS AUREUS CULTURE REINCUBATED FOR BETTER GROWTH Performed at [facility], [address], [place], [place] [address]    Report Status PENDING  Incomplete  Culture, blood (Routine X 2) w Reflex to ID Panel     Status: None (Preliminary result)   Collection Time: 04/08/23  6:09 AM   Specimen: BLOOD LEFT HAND  Result Value Ref Range Status   Specimen Description BLOOD LEFT HAND  Final   Special Requests   Final    BOTTLES DRAWN AEROBIC AND ANAEROBIC Blood Culture adequate volume   Culture   Final    NO GROWTH 2  DAYS Performed at [facility], [address], [place], [place] [address]    Report Status PENDING  Incomplete  Culture, blood (Routine X 2) w Reflex to ID Panel     Status: None (Preliminary result)   Collection Time: 04/08/23  6:11 AM   Specimen: BLOOD LEFT ARM  Result Value Ref Range Status   Specimen Description BLOOD LEFT ARM  Final   Special Requests   Final    BOTTLES DRAWN AEROBIC AND ANAEROBIC Blood Culture adequate volume   Culture   Final    NO GROWTH 2 DAYS Performed at [facility], [address] [place], [place] [address]    Report Status PENDING  Incomplete    RADIOLOGY STUDIES/RESULTS: No results found.   LOS: 4 days   Jeoffrey Massed, MD  [facility]    To contact the attending provider between 7A-7P or the covering provider during after hours 7P-7A, please log into the web site [email or web address] and access using universal [place] password for that web site. If you do not have the password, please call the hospital operator.  04/10/2023, 12:29 PM

## 2023-04-10 NOTE — Plan of Care (Addendum)
Pt has rested quietly throughout the night with no distress noted. Alert and oriented. O2 on. SR on the  monitor. Voids per urinal. Had large BM on BSC. No complaints voiced.     Problem: Education: Goal: Ability to describe self-care measures that may prevent or decrease complications (Diabetes Survival Skills Education) will improve Outcome: Progressing Goal: Individualized Educational Video(s) Outcome: Progressing   Problem: Coping: Goal: Ability to adjust to condition or change in health will improve Outcome: Progressing   Problem: Fluid Volume: Goal: Ability to maintain a balanced intake and output will improve Outcome: Progressing   Problem: Health Behavior/Discharge Planning: Goal: Ability to identify and utilize available resources and services will improve Outcome: Progressing Goal: Ability to manage health-related needs will improve Outcome: Progressing   Problem: Metabolic: Goal: Ability to maintain appropriate glucose levels will improve Outcome: Progressing   Problem: Nutritional: Goal: Maintenance of adequate nutrition will improve Outcome: Progressing Goal: Progress toward achieving an optimal weight will improve Outcome: Progressing   Problem: Skin Integrity: Goal: Risk for impaired skin integrity will decrease Outcome: Progressing   Problem: Tissue Perfusion: Goal: Adequacy of tissue perfusion will improve Outcome: Progressing   Problem: Education: Goal: Knowledge of General Education information will improve Description: Including pain rating scale, medication(s)/side effects and non-pharmacologic comfort measures Outcome: Progressing   Problem: Health Behavior/Discharge Planning: Goal: Ability to manage health-related needs will improve Outcome: Progressing   Problem: Clinical Measurements: Goal: Ability to maintain clinical measurements within normal limits will improve Outcome: Progressing Goal: Will remain free from infection Outcome:  Progressing Goal: Diagnostic test results will improve Outcome: Progressing Goal: Respiratory complications will improve Outcome: Progressing Goal: Cardiovascular complication will be avoided Outcome: Progressing   Problem: Activity: Goal: Risk for activity intolerance will decrease Outcome: Progressing   Problem: Nutrition: Goal: Adequate nutrition will be maintained Outcome: Progressing   Problem: Coping: Goal: Level of anxiety will decrease Outcome: Progressing   Problem: Elimination: Goal: Will not experience complications related to bowel motility Outcome: Progressing Goal: Will not experience complications related to urinary retention Outcome: Progressing   Problem: Pain Management: Goal: General experience of comfort will improve Outcome: Progressing   Problem: Safety: Goal: Ability to remain free from injury will improve Outcome: Progressing   Problem: Skin Integrity: Goal: Risk for impaired skin integrity will decrease Outcome: Progressing   Problem: Education: Goal: Knowledge of the prescribed therapeutic regimen will improve Outcome: Progressing Goal: Individualized Educational Video(s) Outcome: Progressing   Problem: Activity: Goal: Ability to avoid complications of mobility impairment will improve Outcome: Progressing Goal: Range of joint motion will improve Outcome: Progressing   Problem: Clinical Measurements: Goal: Postoperative complications will be avoided or minimized Outcome: Progressing   Problem: Pain Management: Goal: Pain level will decrease with appropriate interventions Outcome: Progressing   Problem: Skin Integrity: Goal: Will show signs of wound healing Outcome: Progressing

## 2023-04-11 ENCOUNTER — Encounter (HOSPITAL_COMMUNITY): Payer: Self-pay | Admitting: Internal Medicine

## 2023-04-11 DIAGNOSIS — B952 Enterococcus as the cause of diseases classified elsewhere: Secondary | ICD-10-CM

## 2023-04-11 DIAGNOSIS — T8453XA Infection and inflammatory reaction due to internal right knee prosthesis, initial encounter: Secondary | ICD-10-CM | POA: Diagnosis not present

## 2023-04-11 DIAGNOSIS — I480 Paroxysmal atrial fibrillation: Secondary | ICD-10-CM | POA: Diagnosis not present

## 2023-04-11 DIAGNOSIS — R7881 Bacteremia: Secondary | ICD-10-CM | POA: Diagnosis not present

## 2023-04-11 DIAGNOSIS — D649 Anemia, unspecified: Secondary | ICD-10-CM | POA: Diagnosis not present

## 2023-04-11 LAB — BASIC METABOLIC PANEL
Anion gap: 8 (ref 5–15)
BUN: 47 mg/dL — ABNORMAL HIGH (ref 8–23)
CO2: 22 mmol/L (ref 22–32)
Calcium: 8.1 mg/dL — ABNORMAL LOW (ref 8.9–10.3)
Chloride: 108 mmol/L (ref 98–111)
Creatinine, Ser: 2.45 mg/dL — ABNORMAL HIGH (ref 0.61–1.24)
GFR, Estimated: 26 mL/min — ABNORMAL LOW (ref >60)
Glucose, Bld: 90 mg/dL (ref 70–99)
Potassium: 4.3 mmol/L (ref 3.5–5.1)
Sodium: 138 mmol/L (ref 135–145)

## 2023-04-11 LAB — CULTURE, BLOOD (ROUTINE X 2): Culture: NO GROWTH

## 2023-04-11 LAB — CBC
HCT: 25.2 % — ABNORMAL LOW (ref 39.0–52.0)
Hemoglobin: 7.7 g/dL — ABNORMAL LOW (ref 13.0–17.0)
MCH: 24.1 pg — ABNORMAL LOW (ref 26.0–34.0)
MCHC: 30.6 g/dL (ref 30.0–36.0)
MCV: 79 fL — ABNORMAL LOW (ref 80.0–100.0)
Platelets: 274 10*3/uL (ref 150–400)
RBC: 3.19 MIL/uL — ABNORMAL LOW (ref 4.22–5.81)
RDW: 18 % — ABNORMAL HIGH (ref 11.5–15.5)
WBC: 8.8 10*3/uL (ref 4.0–10.5)
nRBC: 0 % (ref 0.0–0.2)

## 2023-04-11 LAB — TYPE AND SCREEN
ABO/RH(D): A POS
Antibody Screen: NEGATIVE
Unit division: 0

## 2023-04-11 LAB — BPAM RBC
Blood Product Expiration Date: 202411192359
ISSUE DATE / TIME: 202410260858
Unit Type and Rh: 6200

## 2023-04-11 LAB — GLUCOSE, CAPILLARY
Glucose-Capillary: 145 mg/dL — ABNORMAL HIGH (ref 70–99)
Glucose-Capillary: 93 mg/dL (ref 70–99)
Glucose-Capillary: 96 mg/dL (ref 70–99)
Glucose-Capillary: 147 mg/dL — ABNORMAL HIGH (ref 70–99)

## 2023-04-11 MED ORDER — TORSEMIDE 20 MG PO TABS
40.0000 mg | ORAL_TABLET | Freq: Every day | ORAL | Status: DC
  Administered 2023-04-11: 40 mg via ORAL
  Filled 2023-04-11: qty 2

## 2023-04-11 MED ORDER — FINASTERIDE 5 MG PO TABS
5.0000 mg | ORAL_TABLET | Freq: Every day | ORAL | Status: DC
  Administered 2023-04-11 – 2023-04-13 (×3): 5 mg via ORAL
  Filled 2023-04-11 (×4): qty 1

## 2023-04-11 MED ORDER — CARVEDILOL 6.25 MG PO TABS
6.2500 mg | ORAL_TABLET | Freq: Two times a day (BID) | ORAL | Status: DC
  Administered 2023-04-11 – 2023-04-14 (×6): 6.25 mg via ORAL
  Filled 2023-04-11 (×6): qty 1

## 2023-04-11 NOTE — Anesthesia Preprocedure Evaluation (Addendum)
Anesthesia Evaluation  Patient identified by MRN, date of birth, ID band Patient awake    Reviewed: Allergy & Precautions, NPO status , Patient's Chart, lab work & pertinent test results, reviewed documented beta blocker date and time   Airway Mallampati: III  TM Distance: >3 FB     Dental  (+) Edentulous Upper, Edentulous Lower   Pulmonary sleep apnea , pneumonia, resolved, former smoker, PE   breath sounds clear to auscultation + decreased breath sounds      Cardiovascular hypertension, Pt. on medications and Pt. on home beta blockers +CHF and + DVT   Rhythm:Regular Rate:Normal  Echo 04/07/23 1. Left ventricular ejection fraction, by estimation, is 60 to 65%. The  left ventricle has normal function. The left ventricle has no regional  wall motion abnormalities. Left ventricular diastolic parameters were  normal.   2. Right ventricular systolic function is normal. The right ventricular  size is normal.   3. The mitral valve is normal in structure. No evidence of mitral valve  regurgitation. No evidence of mitral stenosis.   4. The aortic valve is normal in structure. Aortic valve regurgitation is  not visualized. No aortic stenosis is present.   5. The inferior vena cava is normal in size with greater than 50%  respiratory variability, suggesting right atrial pressure of 3 mmHg.   6. No obvious vegetations identified; consider TEE if clinically  indicated.   EKG 04/07/23 Atrial fibrillation, RBBB + LAFB pattern, LVH, baseline artifact    Neuro/Psych  PSYCHIATRIC DISORDERS Anxiety Depression    Peripheral neuroparhy  Neuromuscular disease    GI/Hepatic negative GI ROS, Neg liver ROS,,,  Endo/Other  diabetes, Well Controlled, Type 2  Obesity Hyperlipidemia  Renal/GU Renal InsufficiencyRenal disease   BPH    Musculoskeletal  (+) Arthritis , Osteoarthritis,  Infected right prosthetic knee   Abdominal  (+) + obese   Peds  Hematology  (+) Blood dyscrasia, anemia Eliquis therapy Bacteremia   Anesthesia Other Findings   Reproductive/Obstetrics                             Anesthesia Physical Anesthesia Plan  ASA: 3  Anesthesia Plan: MAC   Post-op Pain Management: Minimal or no pain anticipated   Induction: Intravenous  PONV Risk Score and Plan: 1 and Treatment may vary due to age or medical condition and Propofol infusion  Airway Management Planned: Natural Airway, Nasal Cannula and Simple Face Mask  Additional Equipment:   Intra-op Plan:   Post-operative Plan:   Informed Consent: I have reviewed the patients History and Physical, chart, labs and discussed the procedure including the risks, benefits and alternatives for the proposed anesthesia with the patient or authorized representative who has indicated his/her understanding and acceptance.     Dental advisory given  Plan Discussed with: CRNA and Anesthesiologist  Anesthesia Plan Comments:         Anesthesia Quick Evaluation

## 2023-04-11 NOTE — Consult Note (Signed)
WOC Nurse Consult Note: Reason for Consult: sutured L leg wound that bleeds  Wound type: full thickness traumatic  Pressure Injury POA: Yes Measurement: 2 separate wounds on L lower anterior leg; superior full thickness 4 cm x 5 cm x 0.1 100% pink moist; distal 6 cm x 5 cm that has been sutured  Wound bed: 100% red moist; some dark hemorrhagic tissue present distal wound  Drainage (amount, consistency, odor) minimal serosanguinous  Periwound: intact  Dressing procedure/placement/frequency: Clean L lower anterior leg wounds with NS, apply Xeroform gauze [place] [phone]) to both wounds beds every other day, cover with Telfa nonstick gauze and Kerlix roll gauze starting just above toes and ending right below knee. May secure with Ace bandage wrapped in same fashion.    POC discussed with bedside nurse and patient. WOC team will not follow. Re-consult if further needs arise.   Thank you,    Priscella Mann MSN, RN-BC, [employer] [phone]

## 2023-04-11 NOTE — Plan of Care (Signed)
  Problem: Education: Goal: Ability to describe self-care measures that may prevent or decrease complications (Diabetes Survival Skills Education) will improve Outcome: Progressing Goal: Individualized Educational Video(s) Outcome: Progressing   Problem: Coping: Goal: Ability to adjust to condition or change in health will improve Outcome: Progressing   Problem: Fluid Volume: Goal: Ability to maintain a balanced intake and output will improve Outcome: Progressing   Problem: Health Behavior/Discharge Planning: Goal: Ability to identify and utilize available resources and services will improve Outcome: Progressing Goal: Ability to manage health-related needs will improve Outcome: Progressing   Problem: Metabolic: Goal: Ability to maintain appropriate glucose levels will improve Outcome: Progressing   Problem: Nutritional: Goal: Maintenance of adequate nutrition will improve Outcome: Progressing Goal: Progress toward achieving an optimal weight will improve Outcome: Progressing   Problem: Skin Integrity: Goal: Risk for impaired skin integrity will decrease Outcome: Progressing   Problem: Tissue Perfusion: Goal: Adequacy of tissue perfusion will improve Outcome: Progressing   Problem: Education: Goal: Knowledge of General Education information will improve Description: Including pain rating scale, medication(s)/side effects and non-pharmacologic comfort measures Outcome: Progressing   Problem: Health Behavior/Discharge Planning: Goal: Ability to manage health-related needs will improve Outcome: Progressing   Problem: Clinical Measurements: Goal: Ability to maintain clinical measurements within normal limits will improve Outcome: Progressing Goal: Will remain free from infection Outcome: Progressing Goal: Diagnostic test results will improve Outcome: Progressing Goal: Respiratory complications will improve Outcome: Progressing Goal: Cardiovascular complication will  be avoided Outcome: Progressing   Problem: Activity: Goal: Risk for activity intolerance will decrease Outcome: Progressing   Problem: Nutrition: Goal: Adequate nutrition will be maintained Outcome: Progressing   Problem: Coping: Goal: Level of anxiety will decrease Outcome: Progressing   Problem: Elimination: Goal: Will not experience complications related to bowel motility Outcome: Progressing Goal: Will not experience complications related to urinary retention Outcome: Progressing   Problem: Pain Management: Goal: General experience of comfort will improve Outcome: Progressing   Problem: Safety: Goal: Ability to remain free from injury will improve Outcome: Progressing   Problem: Skin Integrity: Goal: Risk for impaired skin integrity will decrease Outcome: Progressing   Problem: Education: Goal: Knowledge of the prescribed therapeutic regimen will improve Outcome: Progressing Goal: Individualized Educational Video(s) Outcome: Progressing   Problem: Activity: Goal: Ability to avoid complications of mobility impairment will improve Outcome: Progressing Goal: Range of joint motion will improve Outcome: Progressing   Problem: Clinical Measurements: Goal: Postoperative complications will be avoided or minimized Outcome: Progressing   Problem: Pain Management: Goal: Pain level will decrease with appropriate interventions Outcome: Progressing   Problem: Skin Integrity: Goal: Will show signs of wound healing Outcome: Progressing

## 2023-04-11 NOTE — Progress Notes (Signed)
Mobility Specialist Progress Note    04/11/23 1621  Mobility  Activity Refused mobility   Checked on pt x2. Pt stated he was having a rough day with lasix and miralax. Will f/u as schedule permits.   Tanner [name] Mobility Specialist  Please Neurosurgeon or Rehab Office at [phone]

## 2023-04-11 NOTE — Progress Notes (Signed)
PROGRESS NOTE        PATIENT DETAILS Name: Tanner [name] Age: 77 y.o. Sex: male Date of Birth: 11-21-1945 Admit Date: 04/06/2023 Admitting Physician Angie Fava, DO ZOX:[id number], [facility]  Brief Summary: Patient is a 77 y.o.  male chronic HFrEF, DM-2, HTN, PAF on Eliquis-sent right knee arthroplasty 12/15/2022-presented with generalized weakness/ground-level fall-along with right knee discomfort for the past several days-upon further evaluation-he was found to have AKI with polymicrobial bacteremia-likely secondary to periprosthetic right knee infection (gram-negative rod on synovial fluid culture)  Significant events: 10/22>> admit to [facility]-I&D of right knee abscess by ED physician.  Significant studies: 3/6>> echo: EF 45-50% 10/23>> CXR: No PNA 10/23>> CT head: No acute intracranial abnormality 10/23>> CT C-spine: No fracture 10/23>> CT lumbar spine: No fracture 10/23>> x-ray right knee: Right knee arthroplasty without complication-joint effusion with subcutaneous edema anteriorly. 10/23>> renal ultrasound: No hydronephrosis. 1023>> echocardiogram: EF 60-65%, no obvious vegetations identified.  Significant microbiology data: 10/22>> blood culture: MRSA/Enterococcus faecalis 10/22>> superficial knee culture: MRSA 10/23>> intraoperative right knee culture: Staph aureus 10/24>> blood culture: Negative  Procedures: 10/22>> superficial right knee abscess I&D by ED physician 10/23>> excisional/nonexcisional debridement of right knee, polyethylene revision-Dr. Constance Goltz.  Consults: Infectious disease Orthopedics  Subjective: No complaints overnight-lying comfortably in bed.  Had a BM yesterday morning-no BM since then.  Objective: Vitals: Blood pressure (!) 182/64, pulse 69, temperature 98.2 F (36.8 C), temperature source Oral, resp. rate 16, height 5\' 4"  (1.626 m), weight 100 kg, SpO2 95%.   Exam: Gen Exam:Alert awake-not in any  distress HEENT:atraumatic, normocephalic Chest: B/L clear to auscultation anteriorly CVS:S1S2 regular Abdomen:soft non tender, non distended Extremities:++ edema Neurology: Non focal Skin: no rash  Pertinent Labs/Radiology:    Latest Ref Rng & Units 04/11/2023    4:17 AM 04/10/2023    4:50 AM 04/09/2023    3:55 AM  CBC  WBC 4.0 - 10.5 K/uL 8.8  10.2  14.4   Hemoglobin 13.0 - 17.0 g/dL 7.7  7.0  7.1   Hematocrit 39.0 - 52.0 % 25.2  22.7  22.9   Platelets 150 - 400 K/uL 274  300  304     Lab Results  Component Value Date   NA 138 04/11/2023   K 4.3 04/11/2023   CL 108 04/11/2023   CO2 22 04/11/2023      Assessment/Plan: AKI on CKD stage IIIb AKI likely hemodynamically mediated in the setting of bacteremia UA negative for proteinuria Renal ultrasound without hydronephrosis Renal function gradually improving with supportive care Developing edema-will start diuretics Continue to follow creatinine.  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-history of right knee total arthroplasty July 2024 Culture data as above Echo negative for vegetation Daptomycin/Rocephin Repeat blood cultures on 10/24 negative TEE scheduled for 10/28 Await further recommendations from infectious disease  Mechanical fall/generalized weakness Likely due to debility in the setting of AKI/bacteremia Imaging negative for fractures PT/OT eval-plan is for SNF  Acute on chronic HFrEF More edema lower extremities today-likely due to IV fluid resuscitation Stopping IV fluids Start diuretics Continue to hold Jardiance-given active infection  PAF Telemetry monitoring Discussion with orthopedics on 10/24--Eliquis resumed  Normocytic anemia Due to acute illness/AKI-no indication of GI loss-s/p 1 unit of PRBC yesterday-hemoglobin stable at 7.7 Discussed with nursing staff-continue to [facility] for melena/hematochezia Repeat CBC tomorrow morning Cautiously continue with  Eliquis-has no  evidence of GI bleeding.  DM-2 CBG stable SSI  Recent Labs    04/10/23 1522 04/10/23 2120 04/11/23 0751  GLUCAP 114* 141* 145*    HTN BP climbing up Resume Coreg  BPH Finasteride   Obesity: Estimated body mass index is 37.84 kg/m as calculated from the following:   Height as of this encounter: 5\' 4"  (1.626 m).   Weight as of this encounter: 100 kg.   Code status:   Code Status: Full Code   DVT Prophylaxis: SCDs Start: 04/08/23 1038 Place TED hose Start: 04/08/23 1038 SCDs Start: 04/06/23 2154 apixaban (ELIQUIS) tablet 5 mg    Family Communication: Son-Patrick-[phone]-updated over the phone 10/23   Disposition Plan: Status is: Inpatient Remains inpatient appropriate because: Severity of illness   Planned Discharge Destination:Skilled nursing facility   Diet: Diet Order             Diet NPO time specified  Diet effective midnight           Diet regular Room service appropriate? Yes; Fluid consistency: Thin  Diet effective now                     Antimicrobial agents: Anti-infectives (From admission, onward)    Start     Dose/Rate Route Frequency Ordered Stop   04/09/23 1730  cefTRIAXone (ROCEPHIN) 2 g in sodium chloride 0.9 % 100 mL IVPB        2 g 200 mL/hr over 30 Minutes Intravenous Every 24 hours 04/09/23 1423     04/08/23 1445  DAPTOmycin (CUBICIN) 800 mg in sodium chloride 0.9 % IVPB        10 mg/kg  75.5 kg (Adjusted) 132 mL/hr over 30 Minutes Intravenous Every 48 hours 04/08/23 1346     04/08/23 1400  DAPTOmycin (CUBICIN) IVPB 700 mg/132mL premix  Status:  Discontinued        700 mg 200 mL/hr over 30 Minutes Intravenous Every 48 hours 04/07/23 1336 04/08/23 1346   04/07/23 1807  vancomycin (VANCOCIN) powder  Status:  Discontinued          As needed 04/07/23 1807 04/07/23 1905   04/07/23 1615  ceFAZolin (ANCEF) IVPB 2g/100 mL premix        2 g 200 mL/hr over 30 Minutes Intravenous On call to O.R. 04/07/23 1600  04/07/23 1735   04/07/23 1600  cefTRIAXone (ROCEPHIN) 1 g in sodium chloride 0.9 % 100 mL IVPB  Status:  Discontinued        1 g 200 mL/hr over 30 Minutes Intravenous Every 24 hours 04/06/23 2159 04/07/23 0702   04/07/23 1600  cefTRIAXone (ROCEPHIN) 2 g in sodium chloride 0.9 % 100 mL IVPB  Status:  Discontinued        2 g 200 mL/hr over 30 Minutes Intravenous Every 24 hours 04/07/23 0702 04/09/23 1414   04/06/23 2208  vancomycin variable dose per unstable renal function (pharmacist dosing)  Status:  Discontinued         Does not apply See admin instructions 04/06/23 2209 04/07/23 1100   04/06/23 1615  vancomycin (VANCOREADY) IVPB 2000 mg/400 mL        2,000 mg 200 mL/hr over 120 Minutes Intravenous  Once 04/06/23 1606 04/06/23 1939   04/06/23 1600  cefTRIAXone (ROCEPHIN) 2 g in sodium chloride 0.9 % 100 mL IVPB        2 g 200 mL/hr over 30 Minutes Intravenous  Once 04/06/23 1558 04/06/23 1726  MEDICATIONS: Scheduled Meds:  acetaminophen  650 mg Oral Q6H   apixaban  5 mg Oral BID   insulin aspart  0-6 Units Subcutaneous TID WC   pantoprazole  40 mg Oral Q1200   polyethylene glycol  17 g Oral BID   senna  2 tablet Oral QHS   Continuous Infusions:  cefTRIAXone (ROCEPHIN)  IV 2 g (04/10/23 1759)   DAPTOmycin 800 mg (04/10/23 1858)   PRN Meds:.alum & mag hydroxide-simeth, bisacodyl, diphenhydrAMINE, HYDROmorphone (DILAUDID) injection, melatonin, menthol-cetylpyridinium **OR** phenol, methocarbamol **OR** methocarbamol (ROBAXIN) injection, metoCLOPramide **OR** metoCLOPramide (REGLAN) injection, naLOXone (NARCAN)  injection, ondansetron **OR** ondansetron (ZOFRAN) IV, oxyCODONE, oxyCODONE   I have personally reviewed following labs and imaging studies  LABORATORY DATA: CBC: Recent Labs  Lab 04/06/23 1400 04/07/23 0006 04/08/23 0609 04/09/23 0355 04/10/23 0450 04/11/23 0417  WBC 20.8* 14.4* 11.0* 14.4* 10.2 8.8  NEUTROABS 17.1* 12.2*  --   --   --   --   HGB 10.3*  8.6* 7.8* 7.1* 7.0* 7.7*  HCT 34.3* 28.9* 25.8* 22.9* 22.7* 25.2*  MCV 79.2* 79.8* 79.1* 79.0* 79.1* 79.0*  PLT 389 306 311 304 300 274    Basic Metabolic Panel: Recent Labs  Lab 04/06/23 2047 04/07/23 0006 04/08/23 0609 04/08/23 1051 04/09/23 0355 04/10/23 0450 04/11/23 0417  NA  --  135 137 138 135 139 138  K  --  3.9 4.7 4.8 4.2 4.3 4.3  CL  --  100 105 105 106 106 108  CO2  --  21* 23 21* 21* 23 22  GLUCOSE  --  89 155* 134* 152* 109* 90  BUN  --  54* 55* 55* 56* 54* 47*  CREATININE  --  4.64* 4.12* 3.97* 3.50* 2.79* 2.45*  CALCIUM  --  8.1* 8.3* 8.5* 8.2* 8.2* 8.1*  MG 2.2 2.3  --   --   --   --   --     GFR: Estimated Creatinine Clearance: 26.5 mL/min (A) (by C-G formula based on SCr of 2.45 mg/dL (H)).  Liver Function Tests: Recent Labs  Lab 04/06/23 1540 04/07/23 0006 04/08/23 0609  AST 36 31 21  ALT 27 25 19   ALKPHOS 87 80 68  BILITOT 0.5 0.5 0.4  PROT 5.8* 5.0* 4.9*  ALBUMIN 2.5* 2.1* 1.8*   Recent Labs  Lab 04/06/23 1540  LIPASE 37   No results for input(s): "AMMONIA" in the last 168 hours.  Coagulation Profile: No results for input(s): "INR", "PROTIME" in the last 168 hours.  Cardiac Enzymes: Recent Labs  Lab 04/06/23 1540 04/10/23 0450  CKTOTAL 739* 25*    BNP (last 3 results) No results for input(s): "PROBNP" in the last 8760 hours.  Lipid Profile: No results for input(s): "CHOL", "HDL", "LDLCALC", "TRIG", "CHOLHDL", "LDLDIRECT" in the last 72 hours.  Thyroid Function Tests: No results for input(s): "TSH", "T4TOTAL", "FREET4", "T3FREE", "THYROIDAB" in the last 72 hours.   Anemia Panel: No results for input(s): "VITAMINB12", "FOLATE", "FERRITIN", "TIBC", "IRON", "RETICCTPCT" in the last 72 hours.   Urine analysis:    Component Value Date/Time   COLORURINE YELLOW 04/06/2023 1614   APPEARANCEUR CLEAR 04/06/2023 1614   LABSPEC 1.017 04/06/2023 1614   PHURINE 5.0 04/06/2023 1614   GLUCOSEU >=500 (A) 04/06/2023 1614   GLUCOSEU  NEGATIVE 09/13/2007 1028   HGBUR NEGATIVE 04/06/2023 1614   BILIRUBINUR NEGATIVE 04/06/2023 1614   BILIRUBINUR negative 04/19/2015 1417   BILIRUBINUR neg 02/12/2013 1449   KETONESUR NEGATIVE 04/06/2023 1614   PROTEINUR NEGATIVE 04/06/2023  1614   UROBILINOGEN 0.2 04/19/2015 1417   UROBILINOGEN 0.2 mg/dL 16/03/9603 5409   NITRITE NEGATIVE 04/06/2023 1614   LEUKOCYTESUR NEGATIVE 04/06/2023 1614    Sepsis Labs: Lactic Acid, Venous    Component Value Date/Time   LATICACIDVEN 1.7 04/06/2023 1513    MICROBIOLOGY: Recent Results (from the past 240 hour(s))  Culture, blood (routine x 2)     Status: Abnormal   Collection Time: 04/06/23  3:04 PM   Specimen: BLOOD RIGHT ARM  Result Value Ref Range Status   Specimen Description BLOOD RIGHT ARM  Final   Special Requests   Final    BOTTLES DRAWN AEROBIC AND ANAEROBIC Blood Culture adequate volume   Culture  Setup Time   Final    GRAM POSITIVE COCCI IN PAIRS IN CHAINS IN BOTH AEROBIC AND ANAEROBIC BOTTLES CRITICAL RESULT CALLED TO, READ BACK BY AND VERIFIED WITH: PHARMD Gareth Eagle [phone] FCP Performed at [facility], [address], [place], [place] [address]    Culture (A)  Final    ENTEROCOCCUS FAECALIS METHICILLIN RESISTANT STAPHYLOCOCCUS AUREUS    Report Status 04/10/2023 FINAL  Final   Organism ID, Bacteria ENTEROCOCCUS FAECALIS  Final   Organism ID, Bacteria METHICILLIN RESISTANT STAPHYLOCOCCUS AUREUS  Final      Susceptibility   Enterococcus faecalis - MIC*    AMPICILLIN <=2 SENSITIVE Sensitive     VANCOMYCIN 2 SENSITIVE Sensitive     GENTAMICIN SYNERGY SENSITIVE Sensitive     * ENTEROCOCCUS FAECALIS   Methicillin resistant staphylococcus aureus - MIC*    CIPROFLOXACIN >=8 RESISTANT Resistant     ERYTHROMYCIN >=8 RESISTANT Resistant     GENTAMICIN <=0.5 SENSITIVE Sensitive     OXACILLIN >=4 RESISTANT Resistant     TETRACYCLINE <=1 SENSITIVE Sensitive     VANCOMYCIN <=0.5 SENSITIVE Sensitive     TRIMETH/SULFA  <=10 SENSITIVE Sensitive     CLINDAMYCIN <=0.25 SENSITIVE Sensitive     RIFAMPIN <=0.5 SENSITIVE Sensitive     Inducible Clindamycin NEGATIVE Sensitive     LINEZOLID 2 SENSITIVE Sensitive     * METHICILLIN RESISTANT STAPHYLOCOCCUS AUREUS  Blood Culture ID Panel (Reflexed)     Status: Abnormal   Collection Time: 04/06/23  3:04 PM  Result Value Ref Range Status   Enterococcus faecalis DETECTED (A) NOT DETECTED Final    Comment: CRITICAL RESULT CALLED TO, READ BACK BY AND VERIFIED WITH: PHARMD RACHEL G. [id number] [id number] FCP    Enterococcus Faecium NOT DETECTED NOT DETECTED Final   Listeria monocytogenes NOT DETECTED NOT DETECTED Final   Staphylococcus species DETECTED (A) NOT DETECTED Final    Comment: CRITICAL RESULT CALLED TO, READ BACK BY AND VERIFIED WITH: PHARMD RACHEL G. [phone] FCP    Staphylococcus aureus (BCID) DETECTED (A) NOT DETECTED Final    Comment: Methicillin (oxacillin)-resistant Staphylococcus aureus (MRSA). MRSA is predictably resistant to beta-lactam antibiotics (except ceftaroline). Preferred therapy is vancomycin unless clinically contraindicated. Patient requires contact precautions if  hospitalized. CRITICAL RESULT CALLED TO, READ BACK BY AND VERIFIED WITH: PHARMD RACHEL G. [phone] FCP    Staphylococcus epidermidis NOT DETECTED NOT DETECTED Final   Staphylococcus lugdunensis NOT DETECTED NOT DETECTED Final   Streptococcus species DETECTED (A) NOT DETECTED Final    Comment: Not Enterococcus species, Streptococcus agalactiae, Streptococcus pyogenes, or Streptococcus pneumoniae. CRITICAL RESULT CALLED TO, READ BACK BY AND VERIFIED WITH: PHARMD RACHEL G. [phone] FCP    Streptococcus agalactiae NOT DETECTED NOT DETECTED Final   Streptococcus pneumoniae NOT DETECTED NOT  DETECTED Final   Streptococcus pyogenes NOT DETECTED NOT DETECTED Final   A.calcoaceticus-baumannii NOT DETECTED NOT DETECTED Final   Bacteroides fragilis NOT DETECTED NOT DETECTED Final    Enterobacterales NOT DETECTED NOT DETECTED Final   Enterobacter cloacae complex NOT DETECTED NOT DETECTED Final   Escherichia coli NOT DETECTED NOT DETECTED Final   Klebsiella aerogenes NOT DETECTED NOT DETECTED Final   Klebsiella oxytoca NOT DETECTED NOT DETECTED Final   Klebsiella pneumoniae NOT DETECTED NOT DETECTED Final   Proteus species NOT DETECTED NOT DETECTED Final   Salmonella species NOT DETECTED NOT DETECTED Final   Serratia marcescens NOT DETECTED NOT DETECTED Final   Haemophilus influenzae NOT DETECTED NOT DETECTED Final   Neisseria meningitidis NOT DETECTED NOT DETECTED Final   Pseudomonas aeruginosa NOT DETECTED NOT DETECTED Final   Stenotrophomonas maltophilia NOT DETECTED NOT DETECTED Final   Candida albicans NOT DETECTED NOT DETECTED Final   Candida auris NOT DETECTED NOT DETECTED Final   Candida glabrata NOT DETECTED NOT DETECTED Final   Candida krusei NOT DETECTED NOT DETECTED Final   Candida parapsilosis NOT DETECTED NOT DETECTED Final   Candida tropicalis NOT DETECTED NOT DETECTED Final   Cryptococcus neoformans/gattii NOT DETECTED NOT DETECTED Final   Meth resistant mecA/C and MREJ DETECTED (A) NOT DETECTED Final    Comment: CRITICAL RESULT CALLED TO, READ BACK BY AND VERIFIED WITH: PHARMD RACHEL G. [phone] FCP    Vancomycin resistance NOT DETECTED NOT DETECTED Final    Comment: Performed at [facility], [address] [place], [place] [address]  Culture, blood (routine x 2)     Status: None   Collection Time: 04/06/23  6:19 PM   Specimen: BLOOD RIGHT HAND  Result Value Ref Range Status   Specimen Description BLOOD RIGHT HAND  Final   Special Requests   Final    BOTTLES DRAWN AEROBIC ONLY Blood Culture results may not be optimal due to an inadequate volume of blood received in culture bottles   Culture   Final    NO GROWTH 5 DAYS Performed at [facility], [address], [place], [place] [address]    Report Status 04/11/2023 FINAL  Final   Aerobic Culture w Gram Stain (superficial specimen)     Status: None   Collection Time: 04/06/23  8:49 PM   Specimen: KNEE  Result Value Ref Range Status   Specimen Description KNEE  Final   Special Requests NONE  Final   Gram Stain   Final    ABUNDANT WBC PRESENT, PREDOMINANTLY PMN RARE GRAM NEGATIVE RODS Performed at [facility], [address] [place], [place] [address]    Culture RARE METHICILLIN RESISTANT STAPHYLOCOCCUS AUREUS  Final   Report Status 04/09/2023 FINAL  Final   Organism ID, Bacteria METHICILLIN RESISTANT STAPHYLOCOCCUS AUREUS  Final      Susceptibility   Methicillin resistant staphylococcus aureus - MIC*    CIPROFLOXACIN >=8 RESISTANT Resistant     ERYTHROMYCIN >=8 RESISTANT Resistant     GENTAMICIN <=0.5 SENSITIVE Sensitive     OXACILLIN >=4 RESISTANT Resistant     TETRACYCLINE <=1 SENSITIVE Sensitive     VANCOMYCIN <=0.5 SENSITIVE Sensitive     TRIMETH/SULFA <=10 SENSITIVE Sensitive     CLINDAMYCIN <=0.25 SENSITIVE Sensitive     RIFAMPIN <=0.5 SENSITIVE Sensitive     Inducible Clindamycin NEGATIVE Sensitive     LINEZOLID 2 SENSITIVE Sensitive     * RARE METHICILLIN RESISTANT STAPHYLOCOCCUS AUREUS  Fungus Culture With  Stain     Status: None (Preliminary result)   Collection Time: 04/07/23  6:09 PM   Specimen: Wound; Tissue  Result Value Ref Range Status   Fungus Stain Final report  Final    Comment: (NOTE) Performed At: [facility] [address] [place], [place] [id number] Jolene Schimke MD WU:[id number]    Fungus (Mycology) Culture PENDING  Incomplete   Fungal Source TISSUE  Final    Comment: RIGHT KNEE Performed at [facility], [address] [place], [place] [address]   Aerobic/Anaerobic Culture w Gram Stain (surgical/deep wound)     Status: None (Preliminary result)   Collection Time: 04/07/23  6:09 PM   Specimen: Wound; Tissue  Result Value Ref Range Status   Specimen Description TISSUE RIGHT KNEE  Final   Special  Requests NONE  Final   Gram Stain   Final    MODERATE WBC PRESENT, PREDOMINANTLY PMN NO ORGANISMS SEEN    Culture   Final    RARE STAPHYLOCOCCUS AUREUS SUSCEPTIBILITIES TO FOLLOW Performed at [facility], [address] [place], [place] [address]    Report Status PENDING  Incomplete  Fungus Culture Result     Status: None   Collection Time: 04/07/23  6:09 PM  Result Value Ref Range Status   Result 1 Comment  Final    Comment: (NOTE) KOH/Calcofluor preparation:  no fungus observed. Performed At: [facility] [address] [place], [place] [id number] Jolene Schimke MD MW:[id number]   Culture, blood (Routine X 2) w Reflex to ID Panel     Status: None (Preliminary result)   Collection Time: 04/08/23  6:09 AM   Specimen: BLOOD LEFT HAND  Result Value Ref Range Status   Specimen Description BLOOD LEFT HAND  Final   Special Requests   Final    BOTTLES DRAWN AEROBIC AND ANAEROBIC Blood Culture adequate volume   Culture   Final    NO GROWTH 3 DAYS Performed at [facility], [address] [place], [place] [address]    Report Status PENDING  Incomplete  Culture, blood (Routine X 2) w Reflex to ID Panel     Status: None (Preliminary result)   Collection Time: 04/08/23  6:11 AM   Specimen: BLOOD LEFT ARM  Result Value Ref Range Status   Specimen Description BLOOD LEFT ARM  Final   Special Requests   Final    BOTTLES DRAWN AEROBIC AND ANAEROBIC Blood Culture adequate volume   Culture   Final    NO GROWTH 3 DAYS Performed at [facility], [address] [place], [place] [address]    Report Status PENDING  Incomplete    RADIOLOGY STUDIES/RESULTS: No results found.   LOS: 5 days   Jeoffrey Massed, MD  [facility]    To contact the attending provider between 7A-7P or the covering provider during after hours 7P-7A, please log into the web site [email or web address] and access using universal [place] password for that web site. If you do not  have the password, please call the hospital operator.  04/11/2023, 8:46 AM

## 2023-04-12 ENCOUNTER — Inpatient Hospital Stay (HOSPITAL_COMMUNITY): Payer: No Typology Code available for payment source | Admitting: Anesthesiology

## 2023-04-12 ENCOUNTER — Encounter (HOSPITAL_COMMUNITY)
Admission: EM | Disposition: A | Discharge status: Skilled Nursing Facility | Payer: Self-pay | Source: Home / Self Care | Attending: Internal Medicine

## 2023-04-12 ENCOUNTER — Other Ambulatory Visit: Payer: Self-pay

## 2023-04-12 ENCOUNTER — Inpatient Hospital Stay (HOSPITAL_COMMUNITY): Payer: No Typology Code available for payment source

## 2023-04-12 DIAGNOSIS — R7881 Bacteremia: Secondary | ICD-10-CM | POA: Diagnosis not present

## 2023-04-12 DIAGNOSIS — I480 Paroxysmal atrial fibrillation: Secondary | ICD-10-CM | POA: Diagnosis not present

## 2023-04-12 DIAGNOSIS — L02419 Cutaneous abscess of limb, unspecified: Secondary | ICD-10-CM | POA: Diagnosis not present

## 2023-04-12 DIAGNOSIS — N170 Acute kidney failure with tubular necrosis: Secondary | ICD-10-CM | POA: Diagnosis not present

## 2023-04-12 HISTORY — PX: TRANSESOPHAGEAL ECHOCARDIOGRAM (CATH LAB): EP1270

## 2023-04-12 LAB — BASIC METABOLIC PANEL
Anion gap: 11 (ref 5–15)
BUN: 39 mg/dL — ABNORMAL HIGH (ref 8–23)
CO2: 24 mmol/L (ref 22–32)
Calcium: 8.2 mg/dL — ABNORMAL LOW (ref 8.9–10.3)
Chloride: 106 mmol/L (ref 98–111)
Creatinine, Ser: 2.13 mg/dL — ABNORMAL HIGH (ref 0.61–1.24)
GFR, Estimated: 31 mL/min — ABNORMAL LOW (ref >60)
Glucose, Bld: 105 mg/dL — ABNORMAL HIGH (ref 70–99)
Potassium: 3.8 mmol/L (ref 3.5–5.1)
Sodium: 141 mmol/L (ref 135–145)

## 2023-04-12 LAB — GLUCOSE, CAPILLARY
Glucose-Capillary: 101 mg/dL — ABNORMAL HIGH (ref 70–99)
Glucose-Capillary: 93 mg/dL (ref 70–99)
Glucose-Capillary: 70 mg/dL (ref 70–99)
Glucose-Capillary: 114 mg/dL — ABNORMAL HIGH (ref 70–99)
Glucose-Capillary: 111 mg/dL — ABNORMAL HIGH (ref 70–99)

## 2023-04-12 LAB — AEROBIC/ANAEROBIC CULTURE W GRAM STAIN (SURGICAL/DEEP WOUND)

## 2023-04-12 LAB — CBC
HCT: 27.8 % — ABNORMAL LOW (ref 39.0–52.0)
Hemoglobin: 8.3 g/dL — ABNORMAL LOW (ref 13.0–17.0)
MCH: 23.9 pg — ABNORMAL LOW (ref 26.0–34.0)
MCHC: 29.9 g/dL — ABNORMAL LOW (ref 30.0–36.0)
MCV: 79.9 fL — ABNORMAL LOW (ref 80.0–100.0)
Platelets: 260 10*3/uL (ref 150–400)
RBC: 3.48 MIL/uL — ABNORMAL LOW (ref 4.22–5.81)
RDW: 18 % — ABNORMAL HIGH (ref 11.5–15.5)
WBC: 8.9 10*3/uL (ref 4.0–10.5)
nRBC: 0 % (ref 0.0–0.2)

## 2023-04-12 LAB — ECHO TEE

## 2023-04-12 SURGERY — TRANSESOPHAGEAL ECHOCARDIOGRAM (TEE) (CATHLAB)
Anesthesia: Monitor Anesthesia Care

## 2023-04-12 MED ORDER — PROPOFOL 10 MG/ML IV BOLUS
INTRAVENOUS | Status: DC | PRN
  Administered 2023-04-12: 40 mg via INTRAVENOUS
  Administered 2023-04-12: 20 mg via INTRAVENOUS
  Administered 2023-04-12: 40 mg via INTRAVENOUS

## 2023-04-12 MED ORDER — DAPTOMYCIN-SODIUM CHLORIDE 700-0.9 MG/100ML-% IV SOLN
700.0000 mg | Freq: Every day | INTRAVENOUS | Status: DC
  Administered 2023-04-13 – 2023-04-14 (×2): 700 mg via INTRAVENOUS
  Filled 2023-04-12 (×2): qty 100

## 2023-04-12 MED ORDER — SODIUM CHLORIDE 0.9 % IV SOLN
800.0000 mg | Freq: Every day | INTRAVENOUS | Status: AC
  Administered 2023-04-12: 800 mg via INTRAVENOUS
  Filled 2023-04-12: qty 16

## 2023-04-12 MED ORDER — SODIUM CHLORIDE 0.9 % IV SOLN
10.0000 mg/kg | Freq: Every day | INTRAVENOUS | Status: DC
  Filled 2023-04-12: qty 16

## 2023-04-12 MED ORDER — SODIUM CHLORIDE 0.9 % IV SOLN: INTRAVENOUS | Status: DC | PRN

## 2023-04-12 MED ORDER — CHLORHEXIDINE GLUCONATE CLOTH 2 % EX PADS
6.0000 | MEDICATED_PAD | Freq: Every day | CUTANEOUS | Status: DC
  Administered 2023-04-12 – 2023-04-14 (×3): 6 via TOPICAL

## 2023-04-12 MED ORDER — BUTAMBEN-TETRACAINE-BENZOCAINE 2-2-14 % EX AERO
INHALATION_SPRAY | CUTANEOUS | Status: DC | PRN
  Administered 2023-04-12: 1 via TOPICAL

## 2023-04-12 MED ORDER — DAPTOMYCIN-SODIUM CHLORIDE 700-0.9 MG/100ML-% IV SOLN
700.0000 mg | Freq: Every day | INTRAVENOUS | Status: DC
  Filled 2023-04-12: qty 100

## 2023-04-12 MED ORDER — TORSEMIDE 20 MG PO TABS
20.0000 mg | ORAL_TABLET | Freq: Every day | ORAL | Status: DC
  Administered 2023-04-12 – 2023-04-14 (×3): 20 mg via ORAL
  Filled 2023-04-12 (×3): qty 1

## 2023-04-12 MED ORDER — LIDOCAINE 2% (20 MG/ML) 5 ML SYRINGE
INTRAMUSCULAR | Status: DC | PRN
  Administered 2023-04-12: 50 mg via INTRAVENOUS

## 2023-04-12 MED ORDER — BUTAMBEN-TETRACAINE-BENZOCAINE 2-2-14 % EX AERO
INHALATION_SPRAY | CUTANEOUS | Status: AC
  Filled 2023-04-12: qty 20

## 2023-04-12 MED ORDER — SODIUM CHLORIDE 0.9% FLUSH
10.0000 mL | Freq: Two times a day (BID) | INTRAVENOUS | Status: DC
  Administered 2023-04-12 – 2023-04-14 (×5): 10 mL

## 2023-04-12 MED ORDER — PROPOFOL 500 MG/50ML IV EMUL
INTRAVENOUS | Status: DC | PRN
  Administered 2023-04-12: 40 ug/kg/min via INTRAVENOUS

## 2023-04-12 MED ORDER — SODIUM CHLORIDE 0.9% FLUSH: 10.0000 mL | INTRAVENOUS | Status: DC | PRN

## 2023-04-12 MED ORDER — DEXMEDETOMIDINE HCL IN NACL 80 MCG/20ML IV SOLN
INTRAVENOUS | Status: DC | PRN
  Administered 2023-04-12: 8 ug via INTRAVENOUS

## 2023-04-12 NOTE — TOC Progression Note (Signed)
Transition of Care [facility]) - Progression Note    Patient Details  Name: Tanner [name] MRN: [id number] Date of Birth: August 29, 1945  Transition of Care [facility]) CM/SW Contact  Mearl Latin, LCSW Phone Number: 04/12/2023, 1:54 PM  Clinical Narrative:    CSW initiated insurance process for [facility] in anticipation of medical readiness this week. CSW received insurance approval, Certification # [id number], effective 10/29-11/4.      Expected Discharge Plan: Skilled Nursing Facility Barriers to Discharge: Continued Medical Work up  Expected Discharge Plan and Services In-house Referral: Clinical Social Work     Living arrangements for the past 2 months: Single Family Home                                       Social Determinants of Health (SDOH) Interventions SDOH Screenings   Food Insecurity: No Food Insecurity (04/07/2023)  Housing: Low Risk  (04/07/2023)  Transportation Needs: No Transportation Needs (04/07/2023)  Utilities: Not At Risk (04/07/2023)  Alcohol Screen: Low Risk  (10/27/2022)  Depression (PHQ2-9): Low Risk  (01/04/2023)  Financial Resource Strain: Low Risk  (10/27/2022)  Physical Activity: Inactive (10/27/2022)  Social Connections: Moderately Integrated (10/27/2022)  Stress: No Stress Concern Present (12/31/2022)  Recent Concern: Stress - Stress Concern Present (10/30/2022)  Tobacco Use: Medium Risk (04/11/2023)    Readmission Risk Interventions    08/20/2022   11:10 AM  Readmission Risk Prevention Plan  Transportation Screening Complete  PCP or Specialist Appt within 3-5 Days Complete  HRI or Home Care Consult Complete  Palliative Care Screening Not Applicable  Medication Review (RN Care Manager) Complete

## 2023-04-12 NOTE — Interval H&P Note (Signed)
History and Physical Interval Note:  04/12/2023 8:26 AM  Tanner [name]  has presented today for surgery, with the diagnosis of bacteremia.  The various methods of treatment have been discussed with the patient and family. After consideration of risks, benefits and other options for treatment, the patient has consented to  Procedure(s): TRANSESOPHAGEAL ECHOCARDIOGRAM (N/A) as a surgical intervention.  [name] history has been reviewed, patient examined, no change in status, stable for surgery.  I have reviewed the patient's chart and labs.  Questions were answered to the patient's satisfaction.     Chrystie Nose

## 2023-04-12 NOTE — Anesthesia Postprocedure Evaluation (Signed)
Anesthesia Post Note  Patient: Tanner [name]  Procedure(s) Performed: TRANSESOPHAGEAL ECHOCARDIOGRAM     Patient location during evaluation: PACU Anesthesia Type: MAC Level of consciousness: awake and alert Pain management: pain level controlled Vital Signs Assessment: post-procedure vital signs reviewed and stable Respiratory status: spontaneous breathing, nonlabored ventilation and respiratory function stable Cardiovascular status: stable and blood pressure returned to baseline Postop Assessment: no apparent nausea or vomiting Anesthetic complications: no   No notable events documented.  Last Vitals:  Vitals:   04/12/23 0945 04/12/23 0950  BP: (!) 137/43 (!) 127/101  Pulse: 69 67  Resp: 16 16  Temp:    SpO2: 93% 92%    Last Pain:  Vitals:   04/12/23 0935  TempSrc: Temporal  PainSc: 0-No pain                 [place] Krist A.

## 2023-04-12 NOTE — Plan of Care (Signed)
  Problem: Education: Goal: Ability to describe self-care measures that may prevent or decrease complications (Diabetes Survival Skills Education) will improve Outcome: Progressing Goal: Individualized Educational Video(s) Outcome: Progressing   Problem: Coping: Goal: Ability to adjust to condition or change in health will improve Outcome: Progressing   Problem: Fluid Volume: Goal: Ability to maintain a balanced intake and output will improve Outcome: Progressing   Problem: Health Behavior/Discharge Planning: Goal: Ability to identify and utilize available resources and services will improve Outcome: Progressing Goal: Ability to manage health-related needs will improve Outcome: Progressing   Problem: Metabolic: Goal: Ability to maintain appropriate glucose levels will improve Outcome: Progressing   Problem: Nutritional: Goal: Maintenance of adequate nutrition will improve Outcome: Progressing Goal: Progress toward achieving an optimal weight will improve Outcome: Progressing   Problem: Skin Integrity: Goal: Risk for impaired skin integrity will decrease Outcome: Progressing   Problem: Tissue Perfusion: Goal: Adequacy of tissue perfusion will improve Outcome: Progressing   Problem: Education: Goal: Knowledge of General Education information will improve Description: Including pain rating scale, medication(s)/side effects and non-pharmacologic comfort measures Outcome: Progressing   Problem: Health Behavior/Discharge Planning: Goal: Ability to manage health-related needs will improve Outcome: Progressing   Problem: Clinical Measurements: Goal: Ability to maintain clinical measurements within normal limits will improve Outcome: Progressing Goal: Will remain free from infection Outcome: Progressing Goal: Diagnostic test results will improve Outcome: Progressing Goal: Respiratory complications will improve Outcome: Progressing Goal: Cardiovascular complication will  be avoided Outcome: Progressing   Problem: Activity: Goal: Risk for activity intolerance will decrease Outcome: Progressing   Problem: Nutrition: Goal: Adequate nutrition will be maintained Outcome: Progressing   Problem: Coping: Goal: Level of anxiety will decrease Outcome: Progressing   Problem: Elimination: Goal: Will not experience complications related to bowel motility Outcome: Progressing Goal: Will not experience complications related to urinary retention Outcome: Progressing   Problem: Pain Management: Goal: General experience of comfort will improve Outcome: Progressing   Problem: Safety: Goal: Ability to remain free from injury will improve Outcome: Progressing   Problem: Skin Integrity: Goal: Risk for impaired skin integrity will decrease Outcome: Progressing   Problem: Education: Goal: Knowledge of the prescribed therapeutic regimen will improve Outcome: Progressing Goal: Individualized Educational Video(s) Outcome: Progressing   Problem: Activity: Goal: Ability to avoid complications of mobility impairment will improve Outcome: Progressing Goal: Range of joint motion will improve Outcome: Progressing   Problem: Clinical Measurements: Goal: Postoperative complications will be avoided or minimized Outcome: Progressing   Problem: Pain Management: Goal: Pain level will decrease with appropriate interventions Outcome: Progressing   Problem: Skin Integrity: Goal: Will show signs of wound healing Outcome: Progressing

## 2023-04-12 NOTE — Progress Notes (Signed)
Peripherally Inserted Central Catheter Placement  The IV Nurse has discussed with the patient and/or persons authorized to consent for the patient, the purpose of this procedure and the potential benefits and risks involved with this procedure.  The benefits include less needle sticks, lab draws from the catheter, and the patient may be discharged home with the catheter. Risks include, but not limited to, infection, bleeding, blood clot (thrombus formation), and puncture of an artery; nerve damage and irregular heartbeat and possibility to perform a PICC exchange if needed/ordered by physician.  Alternatives to this procedure were also discussed.  Bard Power PICC patient education guide, fact sheet on infection prevention and patient information card has been provided to patient /or left at bedside.  Telephone consent obtained from son due to patient having anesthesia less than 24 hours ago.  PICC Placement Documentation  PICC Single Lumen 04/12/23 Right Brachial 38 cm 0 cm (Active)  Indication for Insertion or Continuance of Line Prolonged intravenous therapies;Home intravenous therapies (PICC only) 04/12/23 1254  Exposed Catheter (cm) 0 cm 04/12/23 1254  Site Assessment Clean, Dry, Intact 04/12/23 1254  Line Status Flushed;Saline locked;Blood return noted 04/12/23 1254  Dressing Type Transparent;Securing device 04/12/23 1254  Dressing Status Antimicrobial disc in place;Clean, Dry, Intact 04/12/23 1254  Line Care Connections checked and tightened 04/12/23 1254  Line Adjustment (NICU/IV Team Only) No 04/12/23 1254  Dressing Intervention New dressing;Adhesive placed at insertion site (IV team only) 04/12/23 1254  Dressing Change Due 04/19/23 04/12/23 1254       Cassey Bacigalupo, Lajean Manes 04/12/2023, 12:54 PM

## 2023-04-12 NOTE — Progress Notes (Signed)
Patient ID: Tanner [name], male   DOB: 15-Mar-1946, 77 y.o.   MRN: [id number] Subjective: 5 Days Post-Op Procedure(s) (LRB): IRRIGATION AND DEBRIDEMENT KNEE WITH POLY EXCHANGE (Right)    Patient reports pain as mild.  No complaints of right knee pain this am. More discouragement with overall clinical picture, significantly deconditioned with limited mobility  Objective:   VITALS:   Vitals:   04/11/23 2348 04/12/23 0306  BP: 118/64 (!) 157/59  Pulse: 71 67  Resp: 17 16  Temp: 97.8 F (36.6 C) 98.8 F (37.1 C)  SpO2: 94% 93%    Neurovascular intact Incision: dressing C/D/I Mild swelling on knee RLE edema significantly improved   LABS Recent Labs    04/10/23 0450 04/11/23 0417 04/12/23 0330  HGB 7.0* 7.7* 8.3*  HCT 22.7* 25.2* 27.8*  WBC 10.2 8.8 8.9  PLT 300 274 260    Recent Labs    04/10/23 0450 04/11/23 0417 04/12/23 0330  NA 139 138 141  K 4.3 4.3 3.8  BUN 54* 47* 39*  CREATININE 2.79* 2.45* 2.13*  GLUCOSE 109* 90 105*    No results for input(s): "LABPT", "INR" in the last 72 hours.   Assessment/Plan: 5 Days Post-Op Procedure(s) (LRB): IRRIGATION AND DEBRIDEMENT KNEE WITH POLY EXCHANGE (Right)   Up with therapy Continue IV antibiotics per ID and pharmacy for 6 weeks Oral suppression likely to allow for improvement in function and strengthening RTC in 2 weeks Call with any clinical concerns with knee while in hospital

## 2023-04-12 NOTE — CV Procedure (Signed)
TRANSESOPHAGEAL ECHOCARDIOGRAM (TEE) NOTE  INDICATIONS: infective endocarditis  PROCEDURE:   Informed consent was obtained prior to the procedure. The risks, benefits and alternatives for the procedure were discussed and the patient comprehended these risks.  Risks include, but are not limited to, cough, sore throat, vomiting, nausea, somnolence, esophageal and stomach trauma or perforation, bleeding, low blood pressure, aspiration, pneumonia, infection, trauma to the teeth and death.    After a procedural time-out, the patient was given propofol for sedation by anesthesia. See their separate report.  [name] heart rate, blood pressure, and oxygen saturation are monitored continuously during the procedure.The oropharynx was anesthetized with topical cetacaine.  The transesophageal probe was inserted in the esophagus and stomach without difficulty and multiple views were obtained.  The patient was kept under observation until the patient left the procedure room.  I was present face-to-face 100% of this time. The patient left the procedure room in stable condition.   Agitated microbubble saline contrast was not administered.  COMPLICATIONS:    There were no immediate complications.  Findings:  LEFT VENTRICLE: The left ventricular wall thickness is normal.  The left ventricular cavity is normal in size. Wall motion is normal.  LVEF is 60-65%.  RIGHT VENTRICLE:  The right ventricle is normal in structure and function without any thrombus or masses.    LEFT ATRIUM:  The left atrium is normal in size without any thrombus or masses.  There is not spontaneous echo contrast ("smoke") in the left atrium consistent with a low flow state.  LEFT ATRIAL APPENDAGE:  The left atrial appendage is free of any thrombus or masses. The appendage has single lobes. Pulse doppler indicates high flow in the appendage.  ATRIAL SEPTUM:  The atrial septum appears intact and is free of thrombus and/or  masses.  There is no evidence for interatrial shunting by color doppler.  RIGHT ATRIUM:  The right atrium is normal in size and function without any thrombus or masses.  MITRAL VALVE:  The mitral valve is normal in structure and function with  trivial  regurgitation.  There were no vegetations or stenosis.  AORTIC VALVE:  The aortic valve is trileaflet, normal in structure and function with  no  regurgitation.  There were no vegetations or stenosis  TRICUSPID VALVE:  The tricuspid valve is normal in structure and function with  trivial  regurgitation.  There were no vegetations or stenosis   PULMONIC VALVE:  The pulmonic valve is normal in structure and function with  trivial  regurgitation.  There were no vegetations or stenosis.   AORTIC ARCH, ASCENDING AND DESCENDING AORTA:  There was grade 2 Myrtis Ser et. Al, 1992) atherosclerosis of the distal aortic arch.  12. PULMONARY VEINS: Anomalous pulmonary venous return was not noted.  13. PERICARDIUM: The pericardium appeared normal and non-thickened.  There is no pericardial effusion.  IMPRESSION:   No endocarditis No LAA thrombus Negative for PFO by color doppler No significant valve disease Grade 2 aortic atheroma LVEF 60-65%, normal wall motion  RECOMMENDATIONS:     Antibiotics per ID recommendations for bacteremia  Time Spent Directly with the Patient:  45 minutes   Chrystie Nose, MD, [facility], FACP  [place]  [facility]  Medical Director of the [facility]  [facility] Diplomate of the American Board of Clinical Lipidology Attending Cardiologist  Direct Dial: [phone]  Fax: [phone]  Website:  [email or web address] Nicely Dadrian Ballantine 04/12/2023, 9:28 AM

## 2023-04-12 NOTE — Transfer of Care (Signed)
Immediate Anesthesia Transfer of Care Note  Patient: Tanner [name]  Procedure(s) Performed: TRANSESOPHAGEAL ECHOCARDIOGRAM  Patient Location: PACU and Cath Lab  Anesthesia Type:MAC  Level of Consciousness: awake  Airway & Oxygen Therapy: Patient Spontanous Breathing  Post-op Assessment: Report given to RN  Post vital signs: stable  Last Vitals:  Vitals Value Taken Time  BP    Temp    Pulse    Resp    SpO2      Last Pain:  Vitals:   04/12/23 0803  TempSrc: Temporal  PainSc: 0-No pain         Complications: No notable events documented.

## 2023-04-12 NOTE — Progress Notes (Addendum)
  Echocardiogram Echocardiogram Transesophageal has been performed.  Milda Smart 04/12/2023, 9:34 AM

## 2023-04-12 NOTE — Progress Notes (Addendum)
Pharmacy Antibiotic Note  Tanner [name] is a 77 y.o. male admitted on 04/06/2023 with bacteremia.  Pharmacy has been consulted for daptomycin dosing.   Currently on dapto/ceftriaxone for complicated bacteremia/septic knee. Scr has improved to 2.13. We will adjust daptomycin dose.   Plan: Change daptomycin 700 mg every 24 hours Continue ceftriaxone 2 g every 24 hours per MD   Height: 5\' 4"  (162.6 cm) Weight: 100 kg (220 lb 7.4 oz) IBW/kg (Calculated) : 59.2  Temp (24hrs), Avg:98.2 F (36.8 C), Min:97.8 F (36.6 C), Max:98.8 F (37.1 C)  Recent Labs  Lab 04/06/23 1513 04/06/23 1540 04/08/23 0609 04/08/23 1051 04/09/23 0355 04/10/23 0450 04/11/23 0417 04/12/23 0330  WBC  --    < > 11.0*  --  14.4* 10.2 8.8 8.9  CREATININE  --    < > 4.12* 3.97* 3.50* 2.79* 2.45* 2.13*  LATICACIDVEN 1.7  --   --   --   --   --   --   --    < > = values in this interval not displayed.    Estimated Creatinine Clearance: 30.5 mL/min (A) (by C-G formula based on SCr of 2.13 mg/dL (H)).    Allergies  Allergen Reactions   Lisinopril Other (See Comments)     Renal impairment   Amlodipine Swelling   Codeine Sulfate Itching and Nausea Only    Antimicrobials this admission: 10/22 Vancomycin x 1 10/22 Ceftriaxone >>  10/24 Daptomycin>>  Microbiology results: 10/24 bcx ngtd 10/23 bcx MRSA 10/22 bcx e faecalis + MRSA, BCID w/ strep species 10/22 knee cx MRSA, GNR on gram stain - didn't grow out  Ulyses Southward, PharmD, [place], AAHIVP, CPP Infectious Disease Pharmacist 04/12/2023 12:54 PM

## 2023-04-12 NOTE — Progress Notes (Signed)
PROGRESS NOTE        PATIENT DETAILS Name: Tanner [name] Age: 77 y.o. Sex: male Date of Birth: 1945-10-16 Admit Date: 04/06/2023 Admitting Physician Angie Fava, DO LKG:[id number], Veterans  Brief Summary: Patient is a 77 y.o.  male chronic HFrEF, DM-2, HTN, PAF on Eliquis-sent right knee arthroplasty 12/15/2022-presented with generalized weakness/ground-level fall-along with right knee discomfort for the past several days-upon further evaluation-he was found to have AKI with polymicrobial bacteremia-likely secondary to periprosthetic right knee infection (gram-negative rod on synovial fluid culture)  Significant events: 10/22>> admit to [facility]-I&D of right knee abscess by ED physician.  Significant studies: 3/6>> echo: EF 45-50% 10/23>> CXR: No PNA 10/23>> CT head: No acute intracranial abnormality 10/23>> CT C-spine: No fracture 10/23>> CT lumbar spine: No fracture 10/23>> x-ray right knee: Right knee arthroplasty without complication-joint effusion with subcutaneous edema anteriorly. 10/23>> renal ultrasound: No hydronephrosis. 1023>> echocardiogram: EF 60-65%, no obvious vegetations identified.  Significant microbiology data: 10/22>> blood culture: MRSA/Enterococcus faecalis 10/22>> superficial knee culture: MRSA 10/23>> intraoperative right knee culture: Staph aureus 10/24>> blood culture: Negative  Procedures: 10/22>> superficial right knee abscess I&D by ED physician 10/23>> excisional/nonexcisional debridement of right knee, polyethylene revision-Dr. Charlann Boxer.  Consults: Infectious disease Orthopedics  Subjective: Does not want to take Demadex anymore-apparently he urinated quite a bit yesterday.  No other complaints.  Objective: Vitals: Blood pressure (!) 150/60, pulse 67, temperature 97.9 F (36.6 C), temperature source Temporal, resp. rate 20, height 5\' 4"  (1.626 m), weight 100 kg, SpO2 92%.   Exam: Gen Exam:Alert awake-not in  any distress HEENT:atraumatic, normocephalic Chest: B/L clear to auscultation anteriorly CVS:S1S2 regular Abdomen:soft non tender, non distended Extremities:+edema Neurology: Non focal Skin: no rash  Pertinent Labs/Radiology:    Latest Ref Rng & Units 04/12/2023    3:30 AM 04/11/2023    4:17 AM 04/10/2023    4:50 AM  CBC  WBC 4.0 - 10.5 K/uL 8.9  8.8  10.2   Hemoglobin 13.0 - 17.0 g/dL 8.3  7.7  7.0   Hematocrit 39.0 - 52.0 % 27.8  25.2  22.7   Platelets 150 - 400 K/uL 260  274  300     Lab Results  Component Value Date   NA 141 04/12/2023   K 3.8 04/12/2023   CL 106 04/12/2023   CO2 24 04/12/2023      Assessment/Plan: AKI on CKD stage IIIb AKI likely hemodynamically mediated in the setting of bacteremia UA negative for proteinuria Renal ultrasound without hydronephrosis Renal function gradually improving with supportive care volume status much better-decrease Demadex to 20 mg  Continue to follow creatinine.    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  Mechanical fall/generalized weakness Likely due to debility in the setting of AKI/bacteremia Imaging negative for fractures PT/OT eval-plan is for SNF  Acute on chronic HFrEF Volume status markedly better-great urine output overnight Decrease Demadex to 20 mg daily.  PAF Telemetry monitoring Discussion with orthopedics on 10/24--Eliquis resumed  Normocytic anemia Due to acute illness/AKI-no indication of GI loss-s/p 1 unit of PRBC on 10/26-Hb now stable and increasing Discussed with nursing staff-had brown-colored stools 10/27-do not think there is any evidence  of GI bleeding Continue with Eliquis.    DM-2 CBG  stable SSI  Recent Labs    04/11/23 2157 04/12/23 0734 04/12/23 0815  GLUCAP 147* 101* 93    HTN BP relatively stable-continue Coreg  BPH Finasteride  Obesity: Estimated body mass index is 37.84 kg/m as calculated from the following:   Height as of this encounter: 5\' 4"  (1.626 m).   Weight as of this encounter: 100 kg.   Code status:   Code Status: Full Code   DVT Prophylaxis: SCDs Start: 04/08/23 1038 Place TED hose Start: 04/08/23 1038 SCDs Start: 04/06/23 2154 apixaban (ELIQUIS) tablet 5 mg    Family Communication: Son-Patrick-[phone]-updated over the phone 10/23   Disposition Plan: Status is: Inpatient Remains inpatient appropriate because: Severity of illness   Planned Discharge Destination:Skilled nursing facility   Diet: Diet Order             Diet NPO time specified Except for: Sips with Meds  Diet effective midnight                     Antimicrobial agents: Anti-infectives (From admission, onward)    Start     Dose/Rate Route Frequency Ordered Stop   04/09/23 1730  [MAR Hold]  cefTRIAXone (ROCEPHIN) 2 g in sodium chloride 0.9 % 100 mL IVPB        (MAR Hold since Mon 04/12/2023 at 0803.Hold Reason: Transfer to a Procedural area)   2 g 200 mL/hr over 30 Minutes Intravenous Every 24 hours 04/09/23 1423     04/08/23 1445  [MAR Hold]  DAPTOmycin (CUBICIN) 800 mg in sodium chloride 0.9 % IVPB        (MAR Hold since Mon 04/12/2023 at 0803.Hold Reason: Transfer to a Procedural area)   10 mg/kg  75.5 kg (Adjusted) 132 mL/hr over 30 Minutes Intravenous Every 48 hours 04/08/23 1346     04/08/23 1400  DAPTOmycin (CUBICIN) IVPB 700 mg/134mL premix  Status:  Discontinued        700 mg 200 mL/hr over 30 Minutes Intravenous Every 48 hours 04/07/23 1336 04/08/23 1346   04/07/23 1807  vancomycin (VANCOCIN) powder  Status:  Discontinued          As needed 04/07/23 1807 04/07/23 1905   04/07/23 1615  ceFAZolin (ANCEF) IVPB 2g/100 mL premix        2  g 200 mL/hr over 30 Minutes Intravenous On call to O.R. 04/07/23 1600 04/07/23 1735   04/07/23 1600  cefTRIAXone (ROCEPHIN) 1 g in sodium chloride 0.9 % 100 mL IVPB  Status:  Discontinued        1 g 200 mL/hr over 30 Minutes Intravenous Every 24 hours 04/06/23 2159 04/07/23 0702   04/07/23 1600  cefTRIAXone (ROCEPHIN) 2 g in sodium chloride 0.9 % 100 mL IVPB  Status:  Discontinued        2 g 200 mL/hr over 30 Minutes Intravenous Every 24 hours 04/07/23 0702 04/09/23 1414   04/06/23 2208  vancomycin variable dose per unstable renal function (pharmacist dosing)  Status:  Discontinued         Does not apply See admin instructions 04/06/23 2209 04/07/23 1100   04/06/23 1615  vancomycin (VANCOREADY) IVPB 2000 mg/400 mL        2,000 mg 200 mL/hr over 120 Minutes Intravenous  Once 04/06/23 1606 04/06/23 1939   04/06/23 1600  cefTRIAXone (ROCEPHIN) 2 g in sodium chloride 0.9 % 100 mL IVPB  2 g 200 mL/hr over 30 Minutes Intravenous  Once 04/06/23 1558 04/06/23 1726        MEDICATIONS: Scheduled Meds:  acetaminophen  650 mg Oral Q6H   [MAR Hold] apixaban  5 mg Oral BID   butamben-tetracaine-benzocaine       [MAR Hold] carvedilol  6.25 mg Oral BID WC   [MAR Hold] finasteride  5 mg Oral QHS   [MAR Hold] insulin aspart  0-6 Units Subcutaneous TID WC   [MAR Hold] pantoprazole  40 mg Oral Q1200   [MAR Hold] polyethylene glycol  17 g Oral BID   [MAR Hold] senna  2 tablet Oral QHS   [MAR Hold] torsemide  20 mg Oral Daily   Continuous Infusions:  [MAR Hold] cefTRIAXone (ROCEPHIN)  IV Stopped (04/11/23 1758)   [MAR Hold] DAPTOmycin Stopped (04/10/23 1928)   PRN Meds:.[MAR Hold] alum & mag hydroxide-simeth, [MAR Hold] bisacodyl, butamben-tetracaine-benzocaine, [MAR Hold] diphenhydrAMINE, HYDROmorphone (DILAUDID) injection, [MAR Hold] melatonin, [MAR Hold] menthol-cetylpyridinium **OR** [MAR Hold] phenol, methocarbamol **OR** methocarbamol (ROBAXIN) injection, metoCLOPramide **OR**  metoCLOPramide (REGLAN) injection, [MAR Hold] naLOXone (NARCAN)  injection, ondansetron **OR** ondansetron (ZOFRAN) IV, oxyCODONE, oxyCODONE   I have personally reviewed following labs and imaging studies  LABORATORY DATA: CBC: Recent Labs  Lab 04/06/23 1400 04/07/23 0006 04/08/23 0609 04/09/23 0355 04/10/23 0450 04/11/23 0417 04/12/23 0330  WBC 20.8* 14.4* 11.0* 14.4* 10.2 8.8 8.9  NEUTROABS 17.1* 12.2*  --   --   --   --   --   HGB 10.3* 8.6* 7.8* 7.1* 7.0* 7.7* 8.3*  HCT 34.3* 28.9* 25.8* 22.9* 22.7* 25.2* 27.8*  MCV 79.2* 79.8* 79.1* 79.0* 79.1* 79.0* 79.9*  PLT 389 306 311 304 300 274 260    Basic Metabolic Panel: Recent Labs  Lab 04/06/23 2047 04/07/23 0006 04/08/23 0609 04/08/23 1051 04/09/23 0355 04/10/23 0450 04/11/23 0417 04/12/23 0330  NA  --  135   < > 138 135 139 138 141  K  --  3.9   < > 4.8 4.2 4.3 4.3 3.8  CL  --  100   < > 105 106 106 108 106  CO2  --  21*   < > 21* 21* 23 22 24   GLUCOSE  --  89   < > 134* 152* 109* 90 105*  BUN  --  54*   < > 55* 56* 54* 47* 39*  CREATININE  --  4.64*   < > 3.97* 3.50* 2.79* 2.45* 2.13*  CALCIUM  --  8.1*   < > 8.5* 8.2* 8.2* 8.1* 8.2*  MG 2.2 2.3  --   --   --   --   --   --    < > = values in this interval not displayed.    GFR: Estimated Creatinine Clearance: 30.5 mL/min (A) (by C-G formula based on SCr of 2.13 mg/dL (H)).  Liver Function Tests: Recent Labs  Lab 04/06/23 1540 04/07/23 0006 04/08/23 0609  AST 36 31 21  ALT 27 25 19   ALKPHOS 87 80 68  BILITOT 0.5 0.5 0.4  PROT 5.8* 5.0* 4.9*  ALBUMIN 2.5* 2.1* 1.8*   Recent Labs  Lab 04/06/23 1540  LIPASE 37   No results for input(s): "AMMONIA" in the last 168 hours.  Coagulation Profile: No results for input(s): "INR", "PROTIME" in the last 168 hours.  Cardiac Enzymes: Recent Labs  Lab 04/06/23 1540 04/10/23 0450  CKTOTAL 739* 25*    BNP (last 3 results) No results for input(s): "  PROBNP" in the last 8760 hours.  Lipid Profile: No  results for input(s): "CHOL", "HDL", "LDLCALC", "TRIG", "CHOLHDL", "LDLDIRECT" in the last 72 hours.  Thyroid Function Tests: No results for input(s): "TSH", "T4TOTAL", "FREET4", "T3FREE", "THYROIDAB" in the last 72 hours.   Anemia Panel: No results for input(s): "VITAMINB12", "FOLATE", "FERRITIN", "TIBC", "IRON", "RETICCTPCT" in the last 72 hours.   Urine analysis:    Component Value Date/Time   COLORURINE YELLOW 04/06/2023 1614   APPEARANCEUR CLEAR 04/06/2023 1614   LABSPEC 1.017 04/06/2023 1614   PHURINE 5.0 04/06/2023 1614   GLUCOSEU >=500 (A) 04/06/2023 1614   GLUCOSEU NEGATIVE 09/13/2007 1028   HGBUR NEGATIVE 04/06/2023 1614   BILIRUBINUR NEGATIVE 04/06/2023 1614   BILIRUBINUR negative 04/19/2015 1417   BILIRUBINUR neg 02/12/2013 1449   KETONESUR NEGATIVE 04/06/2023 1614   PROTEINUR NEGATIVE 04/06/2023 1614   UROBILINOGEN 0.2 04/19/2015 1417   UROBILINOGEN 0.2 mg/dL 82/95/6213 0865   NITRITE NEGATIVE 04/06/2023 1614   LEUKOCYTESUR NEGATIVE 04/06/2023 1614    Sepsis Labs: Lactic Acid, Venous    Component Value Date/Time   LATICACIDVEN 1.7 04/06/2023 1513    MICROBIOLOGY: Recent Results (from the past 240 hour(s))  Culture, blood (routine x 2)     Status: Abnormal   Collection Time: 04/06/23  3:04 PM   Specimen: BLOOD RIGHT ARM  Result Value Ref Range Status   Specimen Description BLOOD RIGHT ARM  Final   Special Requests   Final    BOTTLES DRAWN AEROBIC AND ANAEROBIC Blood Culture adequate volume   Culture  Setup Time   Final    GRAM POSITIVE COCCI IN PAIRS IN CHAINS IN BOTH AEROBIC AND ANAEROBIC BOTTLES CRITICAL RESULT CALLED TO, READ BACK BY AND VERIFIED WITH: PHARMD Gareth Eagle [phone] FCP Performed at [facility], [address], [place], [place] [address]    Culture (A)  Final    ENTEROCOCCUS FAECALIS METHICILLIN RESISTANT STAPHYLOCOCCUS AUREUS    Report Status 04/10/2023 FINAL  Final   Organism ID, Bacteria ENTEROCOCCUS FAECALIS  Final    Organism ID, Bacteria METHICILLIN RESISTANT STAPHYLOCOCCUS AUREUS  Final      Susceptibility   Enterococcus faecalis - MIC*    AMPICILLIN <=2 SENSITIVE Sensitive     VANCOMYCIN 2 SENSITIVE Sensitive     GENTAMICIN SYNERGY SENSITIVE Sensitive     * ENTEROCOCCUS FAECALIS   Methicillin resistant staphylococcus aureus - MIC*    CIPROFLOXACIN >=8 RESISTANT Resistant     ERYTHROMYCIN >=8 RESISTANT Resistant     GENTAMICIN <=0.5 SENSITIVE Sensitive     OXACILLIN >=4 RESISTANT Resistant     TETRACYCLINE <=1 SENSITIVE Sensitive     VANCOMYCIN <=0.5 SENSITIVE Sensitive     TRIMETH/SULFA <=10 SENSITIVE Sensitive     CLINDAMYCIN <=0.25 SENSITIVE Sensitive     RIFAMPIN <=0.5 SENSITIVE Sensitive     Inducible Clindamycin NEGATIVE Sensitive     LINEZOLID 2 SENSITIVE Sensitive     * METHICILLIN RESISTANT STAPHYLOCOCCUS AUREUS  Blood Culture ID Panel (Reflexed)     Status: Abnormal   Collection Time: 04/06/23  3:04 PM  Result Value Ref Range Status   Enterococcus faecalis DETECTED (A) NOT DETECTED Final    Comment: CRITICAL RESULT CALLED TO, READ BACK BY AND VERIFIED WITH: PHARMD RACHEL G. [phone] FCP    Enterococcus Faecium NOT DETECTED NOT DETECTED Final   Listeria monocytogenes NOT DETECTED NOT DETECTED Final   Staphylococcus species DETECTED (A) NOT DETECTED Final    Comment: CRITICAL RESULT CALLED TO, READ BACK  BY AND VERIFIED WITH: PHARMD RACHEL G. [phone] FCP    Staphylococcus aureus (BCID) DETECTED (A) NOT DETECTED Final    Comment: Methicillin (oxacillin)-resistant Staphylococcus aureus (MRSA). MRSA is predictably resistant to beta-lactam antibiotics (except ceftaroline). Preferred therapy is vancomycin unless clinically contraindicated. Patient requires contact precautions if  hospitalized. CRITICAL RESULT CALLED TO, READ BACK BY AND VERIFIED WITH: PHARMD RACHEL G. [phone] FCP    Staphylococcus epidermidis NOT DETECTED NOT DETECTED Final   Staphylococcus lugdunensis NOT  DETECTED NOT DETECTED Final   Streptococcus species DETECTED (A) NOT DETECTED Final    Comment: Not Enterococcus species, Streptococcus agalactiae, Streptococcus pyogenes, or Streptococcus pneumoniae. CRITICAL RESULT CALLED TO, READ BACK BY AND VERIFIED WITH: PHARMD RACHEL G. [phone] FCP    Streptococcus agalactiae NOT DETECTED NOT DETECTED Final   Streptococcus pneumoniae NOT DETECTED NOT DETECTED Final   Streptococcus pyogenes NOT DETECTED NOT DETECTED Final   A.calcoaceticus-baumannii NOT DETECTED NOT DETECTED Final   Bacteroides fragilis NOT DETECTED NOT DETECTED Final   Enterobacterales NOT DETECTED NOT DETECTED Final   Enterobacter cloacae complex NOT DETECTED NOT DETECTED Final   Escherichia coli NOT DETECTED NOT DETECTED Final   Klebsiella aerogenes NOT DETECTED NOT DETECTED Final   Klebsiella oxytoca NOT DETECTED NOT DETECTED Final   Klebsiella pneumoniae NOT DETECTED NOT DETECTED Final   Proteus species NOT DETECTED NOT DETECTED Final   Salmonella species NOT DETECTED NOT DETECTED Final   Serratia marcescens NOT DETECTED NOT DETECTED Final   Haemophilus influenzae NOT DETECTED NOT DETECTED Final   Neisseria meningitidis NOT DETECTED NOT DETECTED Final   Pseudomonas aeruginosa NOT DETECTED NOT DETECTED Final   Stenotrophomonas maltophilia NOT DETECTED NOT DETECTED Final   Candida albicans NOT DETECTED NOT DETECTED Final   Candida auris NOT DETECTED NOT DETECTED Final   Candida glabrata NOT DETECTED NOT DETECTED Final   Candida krusei NOT DETECTED NOT DETECTED Final   Candida parapsilosis NOT DETECTED NOT DETECTED Final   Candida tropicalis NOT DETECTED NOT DETECTED Final   Cryptococcus neoformans/gattii NOT DETECTED NOT DETECTED Final   Meth resistant mecA/C and MREJ DETECTED (A) NOT DETECTED Final    Comment: CRITICAL RESULT CALLED TO, READ BACK BY AND VERIFIED WITH: PHARMD RACHEL G. [phone] FCP    Vancomycin resistance NOT DETECTED NOT DETECTED Final    Comment:  Performed at [facility], [address] [place], [place] [address]  Culture, blood (routine x 2)     Status: None   Collection Time: 04/06/23  6:19 PM   Specimen: BLOOD RIGHT HAND  Result Value Ref Range Status   Specimen Description BLOOD RIGHT HAND  Final   Special Requests   Final    BOTTLES DRAWN AEROBIC ONLY Blood Culture results may not be optimal due to an inadequate volume of blood received in culture bottles   Culture   Final    NO GROWTH 5 DAYS Performed at [facility], [address], [place], [place] [address]    Report Status 04/11/2023 FINAL  Final  Aerobic Culture w Gram Stain (superficial specimen)     Status: None   Collection Time: 04/06/23  8:49 PM   Specimen: KNEE  Result Value Ref Range Status   Specimen Description KNEE  Final   Special Requests NONE  Final   Gram Stain   Final    ABUNDANT WBC PRESENT, PREDOMINANTLY PMN RARE GRAM NEGATIVE RODS Performed at [facility], [address], [place], [place] [address]  Culture RARE METHICILLIN RESISTANT STAPHYLOCOCCUS AUREUS  Final   Report Status 04/09/2023 FINAL  Final   Organism ID, Bacteria METHICILLIN RESISTANT STAPHYLOCOCCUS AUREUS  Final      Susceptibility   Methicillin resistant staphylococcus aureus - MIC*    CIPROFLOXACIN >=8 RESISTANT Resistant     ERYTHROMYCIN >=8 RESISTANT Resistant     GENTAMICIN <=0.5 SENSITIVE Sensitive     OXACILLIN >=4 RESISTANT Resistant     TETRACYCLINE <=1 SENSITIVE Sensitive     VANCOMYCIN <=0.5 SENSITIVE Sensitive     TRIMETH/SULFA <=10 SENSITIVE Sensitive     CLINDAMYCIN <=0.25 SENSITIVE Sensitive     RIFAMPIN <=0.5 SENSITIVE Sensitive     Inducible Clindamycin NEGATIVE Sensitive     LINEZOLID 2 SENSITIVE Sensitive     * RARE METHICILLIN RESISTANT STAPHYLOCOCCUS AUREUS  Fungus Culture With Stain     Status: None (Preliminary result)   Collection Time: 04/07/23  6:09 PM   Specimen: Wound; Tissue  Result Value Ref Range Status   Fungus Stain  Final report  Final    Comment: (NOTE) Performed At: [facility] [address] [place], [place] [id number] Jolene Schimke MD NF:[id number]    Fungus (Mycology) Culture PENDING  Incomplete   Fungal Source TISSUE  Final    Comment: RIGHT KNEE Performed at [facility], [address] [place], [place] [address]   Aerobic/Anaerobic Culture w Gram Stain (surgical/deep wound)     Status: None (Preliminary result)   Collection Time: 04/07/23  6:09 PM   Specimen: Wound; Tissue  Result Value Ref Range Status   Specimen Description TISSUE RIGHT KNEE  Final   Special Requests NONE  Final   Gram Stain   Final    MODERATE WBC PRESENT, PREDOMINANTLY PMN NO ORGANISMS SEEN Performed at [facility], [address], [place], [place] [address]    Culture   Final    RARE METHICILLIN RESISTANT STAPHYLOCOCCUS AUREUS NO ANAEROBES ISOLATED; CULTURE IN PROGRESS FOR 5 DAYS    Report Status PENDING  Incomplete   Organism ID, Bacteria METHICILLIN RESISTANT STAPHYLOCOCCUS AUREUS  Final      Susceptibility   Methicillin resistant staphylococcus aureus - MIC*    CIPROFLOXACIN >=8 RESISTANT Resistant     ERYTHROMYCIN >=8 RESISTANT Resistant     GENTAMICIN <=0.5 SENSITIVE Sensitive     OXACILLIN >=4 RESISTANT Resistant     TETRACYCLINE <=1 SENSITIVE Sensitive     VANCOMYCIN <=0.5 SENSITIVE Sensitive     TRIMETH/SULFA <=10 SENSITIVE Sensitive     CLINDAMYCIN <=0.25 SENSITIVE Sensitive     RIFAMPIN <=0.5 SENSITIVE Sensitive     Inducible Clindamycin NEGATIVE Sensitive     LINEZOLID 2 SENSITIVE Sensitive     * RARE METHICILLIN RESISTANT STAPHYLOCOCCUS AUREUS  Fungus Culture Result     Status: None   Collection Time: 04/07/23  6:09 PM  Result Value Ref Range Status   Result 1 Comment  Final    Comment: (NOTE) KOH/Calcofluor preparation:  no fungus observed. Performed At: [facility] [address] [place], [place] [id number] Jolene Schimke MD VO:[id number]   Culture,  blood (Routine X 2) w Reflex to ID Panel     Status: None (Preliminary result)   Collection Time: 04/08/23  6:09 AM   Specimen: BLOOD LEFT HAND  Result Value Ref Range Status   Specimen Description BLOOD LEFT HAND  Final   Special Requests   Final    BOTTLES DRAWN AEROBIC AND ANAEROBIC Blood Culture adequate volume   Culture  Final    NO GROWTH 4 DAYS Performed at [facility], [address] [place], [place] [address]    Report Status PENDING  Incomplete  Culture, blood (Routine X 2) w Reflex to ID Panel     Status: None (Preliminary result)   Collection Time: 04/08/23  6:11 AM   Specimen: BLOOD LEFT ARM  Result Value Ref Range Status   Specimen Description BLOOD LEFT ARM  Final   Special Requests   Final    BOTTLES DRAWN AEROBIC AND ANAEROBIC Blood Culture adequate volume   Culture   Final    NO GROWTH 4 DAYS Performed at [facility], [address] [place], [place] [address]    Report Status PENDING  Incomplete    RADIOLOGY STUDIES/RESULTS: [name] EKG SITE RITE  Result Date: 04/12/2023 If Site Rite image not attached, placement could not be confirmed due to current cardiac rhythm.    LOS: 6 days   Jeoffrey Massed, MD  [facility]    To contact the attending provider between 7A-7P or the covering provider during after hours 7P-7A, please log into the web site [email or web address] and access using universal [facility] password for that web site. If you do not have the password, please call the hospital operator.  04/12/2023, 9:20 AM

## 2023-04-13 ENCOUNTER — Encounter (HOSPITAL_COMMUNITY): Payer: Self-pay | Admitting: Internal Medicine

## 2023-04-13 DIAGNOSIS — T8453XA Infection and inflammatory reaction due to internal right knee prosthesis, initial encounter: Secondary | ICD-10-CM | POA: Diagnosis not present

## 2023-04-13 DIAGNOSIS — R7881 Bacteremia: Secondary | ICD-10-CM | POA: Diagnosis not present

## 2023-04-13 DIAGNOSIS — D649 Anemia, unspecified: Secondary | ICD-10-CM | POA: Diagnosis not present

## 2023-04-13 DIAGNOSIS — I480 Paroxysmal atrial fibrillation: Secondary | ICD-10-CM | POA: Diagnosis not present

## 2023-04-13 LAB — BASIC METABOLIC PANEL
Anion gap: 10 (ref 5–15)
BUN: 30 mg/dL — ABNORMAL HIGH (ref 8–23)
CO2: 26 mmol/L (ref 22–32)
Calcium: 8.3 mg/dL — ABNORMAL LOW (ref 8.9–10.3)
Chloride: 103 mmol/L (ref 98–111)
Creatinine, Ser: 1.91 mg/dL — ABNORMAL HIGH (ref 0.61–1.24)
GFR, Estimated: 35 mL/min — ABNORMAL LOW (ref >60)
Glucose, Bld: 96 mg/dL (ref 70–99)
Potassium: 3.7 mmol/L (ref 3.5–5.1)
Sodium: 139 mmol/L (ref 135–145)

## 2023-04-13 LAB — CBC
HCT: 30.8 % — ABNORMAL LOW (ref 39.0–52.0)
Hemoglobin: 9.3 g/dL — ABNORMAL LOW (ref 13.0–17.0)
MCH: 24.3 pg — ABNORMAL LOW (ref 26.0–34.0)
MCHC: 30.2 g/dL (ref 30.0–36.0)
MCV: 80.6 fL (ref 80.0–100.0)
Platelets: 230 10*3/uL (ref 150–400)
RBC: 3.82 MIL/uL — ABNORMAL LOW (ref 4.22–5.81)
RDW: 18.8 % — ABNORMAL HIGH (ref 11.5–15.5)
WBC: 9.9 10*3/uL (ref 4.0–10.5)
nRBC: 0 % (ref 0.0–0.2)

## 2023-04-13 LAB — GLUCOSE, CAPILLARY
Glucose-Capillary: 95 mg/dL (ref 70–99)
Glucose-Capillary: 122 mg/dL — ABNORMAL HIGH (ref 70–99)
Glucose-Capillary: 88 mg/dL (ref 70–99)
Glucose-Capillary: 95 mg/dL (ref 70–99)

## 2023-04-13 MED ORDER — ACETAMINOPHEN 325 MG PO TABS: 650.0000 mg | ORAL_TABLET | Freq: Four times a day (QID) | ORAL | Status: DC | PRN

## 2023-04-13 NOTE — Plan of Care (Signed)
  Problem: Education: Goal: Ability to describe self-care measures that may prevent or decrease complications (Diabetes Survival Skills Education) will improve Outcome: Progressing Goal: Individualized Educational Video(s) Outcome: Progressing   Problem: Coping: Goal: Ability to adjust to condition or change in health will improve Outcome: Progressing   Problem: Fluid Volume: Goal: Ability to maintain a balanced intake and output will improve Outcome: Progressing   Problem: Health Behavior/Discharge Planning: Goal: Ability to identify and utilize available resources and services will improve Outcome: Progressing Goal: Ability to manage health-related needs will improve Outcome: Progressing   Problem: Metabolic: Goal: Ability to maintain appropriate glucose levels will improve Outcome: Progressing   Problem: Nutritional: Goal: Maintenance of adequate nutrition will improve Outcome: Progressing Goal: Progress toward achieving an optimal weight will improve Outcome: Progressing   Problem: Skin Integrity: Goal: Risk for impaired skin integrity will decrease Outcome: Progressing   Problem: Tissue Perfusion: Goal: Adequacy of tissue perfusion will improve Outcome: Progressing   Problem: Education: Goal: Knowledge of General Education information will improve Description: Including pain rating scale, medication(s)/side effects and non-pharmacologic comfort measures Outcome: Progressing   Problem: Health Behavior/Discharge Planning: Goal: Ability to manage health-related needs will improve Outcome: Progressing   Problem: Clinical Measurements: Goal: Ability to maintain clinical measurements within normal limits will improve Outcome: Progressing Goal: Will remain free from infection Outcome: Progressing Goal: Diagnostic test results will improve Outcome: Progressing Goal: Respiratory complications will improve Outcome: Progressing Goal: Cardiovascular complication will  be avoided Outcome: Progressing   Problem: Activity: Goal: Risk for activity intolerance will decrease Outcome: Progressing   Problem: Nutrition: Goal: Adequate nutrition will be maintained Outcome: Progressing   Problem: Coping: Goal: Level of anxiety will decrease Outcome: Progressing   Problem: Elimination: Goal: Will not experience complications related to bowel motility Outcome: Progressing Goal: Will not experience complications related to urinary retention Outcome: Progressing   Problem: Pain Management: Goal: General experience of comfort will improve Outcome: Progressing   Problem: Safety: Goal: Ability to remain free from injury will improve Outcome: Progressing   Problem: Skin Integrity: Goal: Risk for impaired skin integrity will decrease Outcome: Progressing   Problem: Education: Goal: Knowledge of the prescribed therapeutic regimen will improve Outcome: Progressing Goal: Individualized Educational Video(s) Outcome: Progressing   Problem: Activity: Goal: Ability to avoid complications of mobility impairment will improve Outcome: Progressing Goal: Range of joint motion will improve Outcome: Progressing   Problem: Clinical Measurements: Goal: Postoperative complications will be avoided or minimized Outcome: Progressing   Problem: Pain Management: Goal: Pain level will decrease with appropriate interventions Outcome: Progressing   Problem: Skin Integrity: Goal: Will show signs of wound healing Outcome: Progressing

## 2023-04-13 NOTE — TOC Progression Note (Signed)
Transition of Care [facility]) - Progression Note    Patient Details  Name: Tanner [name] MRN: [id number] Date of Birth: 07-06-1945  [facility] of [facility]) CM/SW Contact  Mearl Latin, LCSW Phone Number: 04/13/2023, 9:22 AM  Clinical Narrative:    CSW updated patient's son that [facility] patient when stable.    Expected Discharge Plan: Skilled Nursing Facility Barriers to Discharge: Continued Medical Work up  Expected Discharge Plan and Services In-house Referral: Clinical Social Work     Living arrangements for the past 2 months: Single Family Home                                       Social Determinants of Health (SDOH) Interventions SDOH Screenings   Food Insecurity: No Food Insecurity (04/07/2023)  Housing: Low Risk  (04/07/2023)  Transportation Needs: No Transportation Needs (04/07/2023)  Utilities: Not At Risk (04/07/2023)  Alcohol Screen: Low Risk  (10/27/2022)  Depression (PHQ2-9): Low Risk  (01/04/2023)  Financial Resource Strain: Low Risk  (10/27/2022)  Physical Activity: Inactive (10/27/2022)  Social Connections: Moderately Integrated (10/27/2022)  Stress: No Stress Concern Present (12/31/2022)  Recent Concern: Stress - Stress Concern Present (10/30/2022)  Tobacco Use: Medium Risk (04/11/2023)    Readmission Risk Interventions    08/20/2022   11:10 AM  Readmission Risk Prevention Plan  Transportation Screening Complete  PCP or Specialist Appt within 3-5 Days Complete  HRI or Home Care Consult Complete  Palliative Care Screening Not Applicable  Medication Review (RN Care Manager) Complete

## 2023-04-13 NOTE — Progress Notes (Signed)
Physical Therapy Treatment Patient Details Name: Tanner [name] MRN: [id number] DOB: 1946/03/24 Today's Date: 04/13/2023   History of Present Illness 77 y.o. male  admitted to [facility] on 04/06/2023 with cellulitis of the right lower extremity and generalized weakness. +fall, CT head and spine negative, 04/07/23 I&D with poly exchange Rt knee PMH includes R TKR 12/15/2022, L TKR 2022, anxiety, BPH, HF, depression, DMII, DVT on anticoagulation, HTN, PE, afib, CKDIII.    PT Comments  Pt making gradual progress.  He does still need assist for transfers, cues for safety, and fatigues easily.  Pt not ambulating household distances and does not have assist at home.  Continue to recommend Patient will benefit from continued inpatient follow up therapy, <3 hours/day at d/c and pt agreeable at this time.     If plan is discharge home, recommend the following: Assistance with cooking/housework;Direct supervision/assist for medications management;Direct supervision/assist for financial management;Assist for transportation;Help with stairs or ramp for entrance;A little help with walking and/or transfers;A little help with bathing/dressing/bathroom   Can travel by private vehicle        Equipment Recommendations  None recommended by PT    Recommendations for Other Services       Precautions / Restrictions Precautions Precautions: Fall Restrictions Weight Bearing Restrictions: Yes RLE Weight Bearing: Weight bearing as tolerated     Mobility  Bed Mobility Overal bed mobility: Needs Assistance Bed Mobility: Sit to Supine       Sit to supine: Min assist        Transfers Overall transfer level: Needs assistance Equipment used: Rolling walker (2 wheels) Transfers: Sit to/from Stand Sit to Stand: Min assist           General transfer comment: STS x 4 with cues for hand placment; min A to rise    Ambulation/Gait Ambulation/Gait assistance: Min assist Gait Distance (Feet):  12 Feet (8' then 12') Assistive device: Rolling walker (2 wheels) Gait Pattern/deviations: Step-to pattern, Decreased stride length, Knee flexed in stance - right, Knee flexed in stance - left, Shuffle Gait velocity: decreased     General Gait Details: Ambulated 8' then 12' with seated rest break; fatigued easily; chairs set up for rest breaks as needed.  Min A for RW and balance   Stairs             Wheelchair Mobility     Tilt Bed    Modified Rankin (Stroke Patients Only)       Balance Overall balance assessment: Needs assistance Sitting-balance support: No upper extremity supported, Feet supported Sitting balance-Leahy Scale: Fair     Standing balance support: Bilateral upper extremity supported, During functional activity Standing balance-Leahy Scale: Poor Standing balance comment: RW adn min A at times                            Cognition Arousal: Alert Behavior During Therapy: WFL for tasks assessed/performed Overall Cognitive Status: Within Functional Limits for tasks assessed                                          Exercises Total Joint Exercises Knee Flexion: AROM, Both, 20 reps, Seated General Exercises - Lower Extremity Ankle Circles/Pumps: AROM, Both, Seated, 20 reps Quad Sets: AROM, Both, 20 reps, Seated Long Arc Quad: AROM, Both, Seated, 20 reps Other Exercises Other Exercises:  2x10 on all exercises; cues for controlled full ROM Other Exercises: 3 x 30 sec stretch bil knee ext    General Comments General comments (skin integrity, edema, etc.): BP stable; HR 70s-90s; on RA with sats 90% or >      Pertinent Vitals/Pain Pain Assessment Pain Assessment: No/denies pain    Home Living                          Prior Function            PT Goals (current goals can now be found in the care plan section) Progress towards PT goals: Progressing toward goals    Frequency    Min 1X/week      PT  Plan      Co-evaluation              AM-PAC PT "6 Clicks" Mobility   Outcome Measure  Help needed turning from your back to your side while in a flat bed without using bedrails?: A Little Help needed moving from lying on your back to sitting on the side of a flat bed without using bedrails?: A Little Help needed moving to and from a bed to a chair (including a wheelchair)?: A Little Help needed standing up from a chair using your arms (e.g., wheelchair or bedside chair)?: A Little Help needed to walk in hospital room?: Total (<20') Help needed climbing 3-5 steps with a railing? : Total 6 Click Score: 14    End of Session Equipment Utilized During Treatment: Gait belt Activity Tolerance: Patient tolerated treatment well Patient left: with call bell/phone within reach;in bed;with bed alarm set Nurse Communication: Mobility status PT Visit Diagnosis: Other abnormalities of gait and mobility (R26.89);Muscle weakness (generalized) (M62.81);Repeated falls (R29.6)     Time: 5784-6962 PT Time Calculation (min) (ACUTE ONLY): 26 min  Charges:    $Gait Training: 8-22 mins $Therapeutic Exercise: 8-22 mins PT General Charges $$ ACUTE PT VISIT: 1 Visit                     Anise Salvo, PT Acute Rehab Services [facility] [phone]    Rayetta Humphrey 04/13/2023, 11:00 AM

## 2023-04-13 NOTE — Progress Notes (Signed)
Occupational Therapy Treatment Patient Details Name: Tanner [name] MRN: [id number] DOB: 1945/12/15 Today's Date: 04/13/2023   History of present illness 77 y.o. male  admitted to [facility] on 04/06/2023 with cellulitis of the right lower extremity and generalized weakness. +fall, CT head and spine negative, 04/07/23 I&D with poly exchange Rt knee PMH includes R TKR 12/15/2022, L TKR 2022, anxiety, BPH, HF, depression, DMII, DVT on anticoagulation, HTN, PE, afib, CKDIII.   OT comments  Pt progressing towards goals this session, able to walk room distance to door and back with CGA and use of RW. Pt needing x1 seated rest break to perform grooming task at sink with set up A. Pt min A for bed mobility, encouraged sitting up in chair x1 hour at end of session and educated on pillow placement for RLE (no pillow under knee). Pt presenting with impairments listed below, will follow acutely. Patient will benefit from continued inpatient follow up therapy, <3 hours/day to maximize safety/ind with ADLs/functional mobility.       If plan is discharge home, recommend the following:  A lot of help with bathing/dressing/bathroom;Assistance with cooking/housework;Help with stairs or ramp for entrance;A little help with walking and/or transfers   Equipment Recommendations  Other (comment) (defer)    Recommendations for Other Services      Precautions / Restrictions Precautions Precautions: Fall Precaution Comments: multiple trip/falls Restrictions Weight Bearing Restrictions: Yes RLE Weight Bearing: Weight bearing as tolerated       Mobility Bed Mobility Overal bed mobility: Needs Assistance Bed Mobility: Sit to Supine Rolling: Min assist              Transfers Overall transfer level: Needs assistance Equipment used: Rolling walker (2 wheels) Transfers: Sit to/from Stand Sit to Stand: Min assist                 Balance Overall balance assessment: Needs  assistance Sitting-balance support: No upper extremity supported, Feet supported Sitting balance-Leahy Scale: Good Sitting balance - Comments: reaches down toward feet to pull up socks without LOB   Standing balance support: Bilateral upper extremity supported, During functional activity Standing balance-Leahy Scale: Poor Standing balance comment: RW adn min A at times                           ADL either performed or assessed with clinical judgement   ADL Overall ADL's : Needs assistance/impaired Eating/Feeding: Independent;Sitting   Grooming: Set up;Sitting                   Toilet Transfer: Contact guard assist;Ambulation;Regular Toilet;Rolling walker (2 wheels)                  Extremity/Trunk Assessment Upper Extremity Assessment Upper Extremity Assessment: Generalized weakness   Lower Extremity Assessment Lower Extremity Assessment: Defer to PT evaluation        Vision   Vision Assessment?: No apparent visual deficits   Perception Perception Perception: Not tested   Praxis Praxis Praxis: Not tested    Cognition Arousal: Alert Behavior During Therapy: [facility] for tasks assessed/performed Overall Cognitive Status: Within Functional Limits for tasks assessed                                          Exercises      Shoulder Instructions  General Comments VSS on RA    Pertinent Vitals/ Pain       Pain Assessment Pain Assessment: No/denies pain  Home Living                                          Prior Functioning/Environment              Frequency  Min 1X/week        Progress Toward Goals  OT Goals(current goals can now be found in the care plan section)  Progress towards OT goals: Progressing toward goals  Acute Rehab OT Goals Patient Stated Goal: none stated OT Goal Formulation: With patient Time For Goal Achievement: 04/22/23 Potential to Achieve Goals: Good ADL  Goals Pt Will Perform Grooming: standing;with contact guard assist Pt Will Perform Lower Body Bathing: sitting/lateral leans;with contact guard assist;with set-up Pt Will Transfer to Toilet: with contact guard assist;stand pivot transfer;bedside commode Pt Will Perform Toileting - Clothing Manipulation and hygiene: with contact guard assist;sit to/from stand Pt Will Perform Tub/Shower Transfer: Shower transfer;shower seat;with contact guard assist  Plan      Co-evaluation                 AM-PAC OT "6 Clicks" Daily Activity     Outcome Measure   Help from another person eating meals?: None Help from another person taking care of personal grooming?: A Little Help from another person toileting, which includes using toliet, bedpan, or urinal?: A Lot Help from another person bathing (including washing, rinsing, drying)?: A Lot Help from another person to put on and taking off regular upper body clothing?: A Little Help from another person to put on and taking off regular lower body clothing?: A Lot 6 Click Score: 16    End of Session Equipment Utilized During Treatment: Gait belt;Rolling walker (2 wheels)  OT Visit Diagnosis: Other abnormalities of gait and mobility (R26.89);Unsteadiness on feet (R26.81)   Activity Tolerance Patient tolerated treatment well   Patient Left with call bell/phone within reach;in chair;with chair alarm set;with nursing/sitter in room   Nurse Communication Mobility status        Time: 1359-1420 OT Time Calculation (min): 21 min  Charges: OT General Charges $OT Visit: 1 Visit OT Treatments $Self Care/Home Management : 8-22 mins  Carver Fila, OTD, OTR/L [employer] [facility]erred Acute Rehab [phone]    Carver Fila Koonce 04/13/2023, 2:28 PM

## 2023-04-13 NOTE — Progress Notes (Signed)
PHARMACY CONSULT NOTE FOR:  OUTPATIENT  PARENTERAL ANTIBIOTIC THERAPY (OPAT)  Indication: MRSA R-knee PJI Regimen: Daptomycin 750 mg IV every 24 hours End date: 05/19/23  IV antibiotic discharge orders are pended. To discharging provider:  please sign these orders via discharge navigator,  Select New Orders & click on the button choice - Manage This Unsigned Work.     Thank you for allowing pharmacy to be a part of this patient's care.  Georgina Pillion, PharmD, BCPS, BCIDP Infectious Diseases Clinical Pharmacist 04/13/2023 10:30 AM   **Pharmacist phone directory can now be found on [email or web address] ([email or web address]).  Listed under [facility].

## 2023-04-13 NOTE — Progress Notes (Addendum)
PROGRESS NOTE        PATIENT DETAILS Name: Tanner [name] Age: 77 y.o. Sex: male Date of Birth: June 04, 1946 Admit Date: 04/06/2023 Admitting Physician Angie Fava, DO QIO:[id number], [employer]  Brief Summary: Patient is a 77 y.o.  male chronic HFrEF, DM-2, HTN, PAF on Eliquis-sent right knee arthroplasty 12/15/2022-presented with generalized weakness/ground-level fall-along with right knee discomfort for the past several days-upon further evaluation-he was found to have AKI with polymicrobial bacteremia-likely secondary to periprosthetic right knee infection (gram-negative rod on synovial fluid culture)  Significant events: 10/22>> admit to [facility]-I&D of right knee abscess by ED physician.  Significant studies: 3/6>> echo: EF 45-50% 10/23>> CXR: No PNA 10/23>> CT head: No acute intracranial abnormality 10/23>> CT C-spine: No fracture 10/23>> CT lumbar spine: No fracture 10/23>> x-ray right knee: Right knee arthroplasty without complication-joint effusion with subcutaneous edema anteriorly. 10/23>> renal ultrasound: No hydronephrosis. 1023>> echocardiogram: EF 60-65%, no obvious vegetations identified. 10/28>> TEE: No vegetations  Significant microbiology data: 10/22>> blood culture: MRSA/Enterococcus faecalis 10/22>> superficial knee culture: MRSA 10/23>> intraoperative right knee culture: MRSA 10/24>> blood culture: Negative  Procedures: 10/22>> superficial right knee abscess I&D by ED physician 10/23>> excisional/nonexcisional debridement of right knee, polyethylene revision-Dr. Charlann Boxer. 10/29>> PICC line placement  Consults: Infectious disease Orthopedics  Subjective: No complaints-lying comfortably in bed.  Objective: Vitals: Blood pressure (!) 158/69, pulse 77, temperature 98.2 F (36.8 C), temperature source Oral, resp. rate 19, height 5\' 4"  (1.626 m), weight 100 kg, SpO2 92%.   Exam: Gen Exam:Alert awake-not in any  distress HEENT:atraumatic, normocephalic Chest: B/L clear to auscultation anteriorly CVS:S1S2 regular Abdomen:soft non tender, non distended Extremities: Trace edema-see picture below left leg. Neurology: Non focal Skin: no rash    Pertinent Labs/Radiology:    Latest Ref Rng & Units 04/13/2023    3:28 AM 04/12/2023    3:30 AM 04/11/2023    4:17 AM  CBC  WBC 4.0 - 10.5 K/uL 9.9  8.9  8.8   Hemoglobin 13.0 - 17.0 g/dL 9.3  8.3  7.7   Hematocrit 39.0 - 52.0 % 30.8  27.8  25.2   Platelets 150 - 400 K/uL 230  260  274     Lab Results  Component Value Date   NA 139 04/13/2023   K 3.7 04/13/2023   CL 103 04/13/2023   CO2 26 04/13/2023      Assessment/Plan: AKI on CKD stage IIIb AKI likely hemodynamically mediated in the setting of bacteremia UA negative for proteinuria Renal ultrasound without hydronephrosis Renal function improved with supportive care and back to baseline   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  Mechanical fall/generalized weakness Likely due to debility in the setting of AKI/bacteremia Imaging negative for fractures PT/OT eval-plan is for SNF  Acute on chronic HFrEF Volume status improved Continue Demadex to 20 mg daily.  PAF Telemetry monitoring Discussion with orthopedics on 10/24--Eliquis resumed  Normocytic anemia Due to acute illness/AKI-no indication of GI loss-s/p 1 unit of PRBC on 10/26-Hb now stable and improved Follow CBC periodically  DM-2 CBG stable SSI  Recent Labs    04/12/23 1605 04/12/23 2129 04/13/23 1027  PROGRESS NOTE        PATIENT DETAILS Name: Tanner [name] Age: 77 y.o. Sex: male Date of Birth: June 04, 1946 Admit Date: 04/06/2023 Admitting Physician Angie Fava, DO QIO:[id number], [facility]  Brief Summary: Patient is a 77 y.o.  male chronic HFrEF, DM-2, HTN, PAF on Eliquis-sent right knee arthroplasty 12/15/2022-presented with generalized weakness/ground-level fall-along with right knee discomfort for the past several days-upon further evaluation-he was found to have AKI with polymicrobial bacteremia-likely secondary to periprosthetic right knee infection (gram-negative rod on synovial fluid culture)  Significant events: 10/22>> admit to [facility]-I&D of right knee abscess by ED physician.  Significant studies: 3/6>> echo: EF 45-50% 10/23>> CXR: No PNA 10/23>> CT head: No acute intracranial abnormality 10/23>> CT C-spine: No fracture 10/23>> CT lumbar spine: No fracture 10/23>> x-ray right knee: Right knee arthroplasty without complication-joint effusion with subcutaneous edema anteriorly. 10/23>> renal ultrasound: No hydronephrosis. 1023>> echocardiogram: EF 60-65%, no obvious vegetations identified. 10/28>> TEE: No vegetations  Significant microbiology data: 10/22>> blood culture: MRSA/Enterococcus faecalis 10/22>> superficial knee culture: MRSA 10/23>> intraoperative right knee culture: MRSA 10/24>> blood culture: Negative  Procedures: 10/22>> superficial right knee abscess I&D by ED physician 10/23>> excisional/nonexcisional debridement of right knee, polyethylene revision-Dr. Charlann Boxer. 10/29>> PICC line placement  Consults: Infectious disease Orthopedics  Subjective: No complaints-lying comfortably in bed.  Objective: Vitals: Blood pressure (!) 158/69, pulse 77, temperature 98.2 F (36.8 C), temperature source Oral, resp. rate 19, height 5\' 4"  (1.626 m), weight 100 kg, SpO2 92%.   Exam: Gen Exam:Alert awake-not in any  distress HEENT:atraumatic, normocephalic Chest: B/L clear to auscultation anteriorly CVS:S1S2 regular Abdomen:soft non tender, non distended Extremities: Trace edema-see picture below left leg. Neurology: Non focal Skin: no rash    Pertinent Labs/Radiology:    Latest Ref Rng & Units 04/13/2023    3:28 AM 04/12/2023    3:30 AM 04/11/2023    4:17 AM  CBC  WBC 4.0 - 10.5 K/uL 9.9  8.9  8.8   Hemoglobin 13.0 - 17.0 g/dL 9.3  8.3  7.7   Hematocrit 39.0 - 52.0 % 30.8  27.8  25.2   Platelets 150 - 400 K/uL 230  260  274     Lab Results  Component Value Date   NA 139 04/13/2023   K 3.7 04/13/2023   CL 103 04/13/2023   CO2 26 04/13/2023      Assessment/Plan: AKI on CKD stage IIIb AKI likely hemodynamically mediated in the setting of bacteremia UA negative for proteinuria Renal ultrasound without hydronephrosis Renal function improved with supportive care and back to baseline   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  Mechanical fall/generalized weakness Likely due to debility in the setting of AKI/bacteremia Imaging negative for fractures PT/OT eval-plan is for SNF  Acute on chronic HFrEF Volume status improved Continue Demadex to 20 mg daily.  PAF Telemetry monitoring Discussion with orthopedics on 10/24--Eliquis resumed  Normocytic anemia Due to acute illness/AKI-no indication of GI loss-s/p 1 unit of PRBC on 10/26-Hb now stable and improved Follow CBC periodically  DM-2 CBG stable SSI  Recent Labs    04/12/23 1605 04/12/23 2129 04/13/23 1027  Comment  Final    Comment: (NOTE) KOH/Calcofluor preparation:  no fungus observed. Performed At: [facility] [address] [place], [place] [id number] Jolene Schimke MD BJ:[id number]   Culture, blood (Routine X 2) w Reflex to ID Panel     Status: None (Preliminary result)   Collection Time: 04/08/23  6:09 AM   Specimen: BLOOD LEFT HAND  Result Value Ref Range Status   Specimen Description BLOOD LEFT HAND  Final   Special Requests   Final    BOTTLES DRAWN AEROBIC AND ANAEROBIC Blood Culture adequate volume   Culture   Final    NO GROWTH 4 DAYS Performed at [facility], [address], [place], [place] [address]    Report Status PENDING  Incomplete  Culture, blood (Routine X 2) w Reflex to ID Panel     Status: None (Preliminary result)   Collection Time: 04/08/23  6:11 AM   Specimen: BLOOD LEFT ARM  Result Value Ref Range Status   Specimen Description BLOOD LEFT ARM  Final   Special Requests   Final    BOTTLES DRAWN AEROBIC AND ANAEROBIC Blood Culture adequate volume   Culture   Final    NO GROWTH 4 DAYS Performed at [facility], [address] [place], [place] [address]    Report Status PENDING  Incomplete    RADIOLOGY STUDIES/RESULTS: ECHO TEE  Result Date: 04/12/2023    TRANSESOPHOGEAL ECHO REPORT   Patient Name:   Tanner [name] Date of Exam: 04/12/2023 Medical Rec #:  [id number]      Height:       64.0 in Accession #:    [id number]     Weight:       220.5 lb Date of Birth:  1945-10-03      BSA:          2.039 m Patient Age:    77 years       BP:           101/79 mmHg Patient Gender: M              HR:           71 bpm. Exam Location:  Inpatient Procedure: Transesophageal Echo, Color Doppler and Cardiac Doppler Indications:    Bacteremia  History:        Patient has  prior history of Echocardiogram examinations, most                 recent 04/07/2023. CHF; Risk Factors:Sleep Apnea, Hypertension                 and Diabetes. Hx of PE, CKD.  Sonographer:    Milda Smart Referring Phys: Corrin Parker PROCEDURE: After discussion of the risks and benefits of a TEE, an informed consent was obtained from the patient. TEE procedure time was 7 minutes. The transesophogeal probe was passed without difficulty through the esophogus of the patient. Imaged were  obtained with the patient in a supine position. Local oropharyngeal anesthetic was provided with Cetacaine. Sedation performed by different physician. The patient was monitored while under deep sedation. Anesthestetic sedation was provided intravenously  by Anesthesiology: 202.1mg  of Propofol, 100mg  of Lidocaine. Image quality was good. [name] vital signs; including heart rate, blood pressure, and oxygen saturation; remained stable throughout the procedure. The patient developed no complications during the procedure.  IMPRESSIONS  1. Left ventricular ejection fraction, by estimation, is 60 to 65%. The left ventricle has normal  Comment  Final    Comment: (NOTE) KOH/Calcofluor preparation:  no fungus observed. Performed At: [facility] [address] [place], [place] [id number] Jolene Schimke MD BJ:[id number]   Culture, blood (Routine X 2) w Reflex to ID Panel     Status: None (Preliminary result)   Collection Time: 04/08/23  6:09 AM   Specimen: BLOOD LEFT HAND  Result Value Ref Range Status   Specimen Description BLOOD LEFT HAND  Final   Special Requests   Final    BOTTLES DRAWN AEROBIC AND ANAEROBIC Blood Culture adequate volume   Culture   Final    NO GROWTH 4 DAYS Performed at [facility], [address], [place], [place] [address]    Report Status PENDING  Incomplete  Culture, blood (Routine X 2) w Reflex to ID Panel     Status: None (Preliminary result)   Collection Time: 04/08/23  6:11 AM   Specimen: BLOOD LEFT ARM  Result Value Ref Range Status   Specimen Description BLOOD LEFT ARM  Final   Special Requests   Final    BOTTLES DRAWN AEROBIC AND ANAEROBIC Blood Culture adequate volume   Culture   Final    NO GROWTH 4 DAYS Performed at [facility], [address] [place], [place] [address]    Report Status PENDING  Incomplete    RADIOLOGY STUDIES/RESULTS: ECHO TEE  Result Date: 04/12/2023    TRANSESOPHOGEAL ECHO REPORT   Patient Name:   Tanner [name] Date of Exam: 04/12/2023 Medical Rec #:  [id number]      Height:       64.0 in Accession #:    [id number]     Weight:       220.5 lb Date of Birth:  1945-10-03      BSA:          2.039 m Patient Age:    77 years       BP:           101/79 mmHg Patient Gender: M              HR:           71 bpm. Exam Location:  Inpatient Procedure: Transesophageal Echo, Color Doppler and Cardiac Doppler Indications:    Bacteremia  History:        Patient has  prior history of Echocardiogram examinations, most                 recent 04/07/2023. CHF; Risk Factors:Sleep Apnea, Hypertension                 and Diabetes. Hx of PE, CKD.  Sonographer:    Milda Smart Referring Phys: Corrin Parker PROCEDURE: After discussion of the risks and benefits of a TEE, an informed consent was obtained from the patient. TEE procedure time was 7 minutes. The transesophogeal probe was passed without difficulty through the esophogus of the patient. Imaged were  obtained with the patient in a supine position. Local oropharyngeal anesthetic was provided with Cetacaine. Sedation performed by different physician. The patient was monitored while under deep sedation. Anesthestetic sedation was provided intravenously  by Anesthesiology: 202.1mg  of Propofol, 100mg  of Lidocaine. Image quality was good. [name] vital signs; including heart rate, blood pressure, and oxygen saturation; remained stable throughout the procedure. The patient developed no complications during the procedure.  IMPRESSIONS  1. Left ventricular ejection fraction, by estimation, is 60 to 65%. The left ventricle has normal  PROGRESS NOTE        PATIENT DETAILS Name: Tanner [name] Age: 77 y.o. Sex: male Date of Birth: June 04, 1946 Admit Date: 04/06/2023 Admitting Physician Angie Fava, DO QIO:[id number], [facility]  Brief Summary: Patient is a 77 y.o.  male chronic HFrEF, DM-2, HTN, PAF on Eliquis-sent right knee arthroplasty 12/15/2022-presented with generalized weakness/ground-level fall-along with right knee discomfort for the past several days-upon further evaluation-he was found to have AKI with polymicrobial bacteremia-likely secondary to periprosthetic right knee infection (gram-negative rod on synovial fluid culture)  Significant events: 10/22>> admit to [facility]-I&D of right knee abscess by ED physician.  Significant studies: 3/6>> echo: EF 45-50% 10/23>> CXR: No PNA 10/23>> CT head: No acute intracranial abnormality 10/23>> CT C-spine: No fracture 10/23>> CT lumbar spine: No fracture 10/23>> x-ray right knee: Right knee arthroplasty without complication-joint effusion with subcutaneous edema anteriorly. 10/23>> renal ultrasound: No hydronephrosis. 1023>> echocardiogram: EF 60-65%, no obvious vegetations identified. 10/28>> TEE: No vegetations  Significant microbiology data: 10/22>> blood culture: MRSA/Enterococcus faecalis 10/22>> superficial knee culture: MRSA 10/23>> intraoperative right knee culture: MRSA 10/24>> blood culture: Negative  Procedures: 10/22>> superficial right knee abscess I&D by ED physician 10/23>> excisional/nonexcisional debridement of right knee, polyethylene revision-Dr. Charlann Boxer. 10/29>> PICC line placement  Consults: Infectious disease Orthopedics  Subjective: No complaints-lying comfortably in bed.  Objective: Vitals: Blood pressure (!) 158/69, pulse 77, temperature 98.2 F (36.8 C), temperature source Oral, resp. rate 19, height 5\' 4"  (1.626 m), weight 100 kg, SpO2 92%.   Exam: Gen Exam:Alert awake-not in any  distress HEENT:atraumatic, normocephalic Chest: B/L clear to auscultation anteriorly CVS:S1S2 regular Abdomen:soft non tender, non distended Extremities: Trace edema-see picture below left leg. Neurology: Non focal Skin: no rash    Pertinent Labs/Radiology:    Latest Ref Rng & Units 04/13/2023    3:28 AM 04/12/2023    3:30 AM 04/11/2023    4:17 AM  CBC  WBC 4.0 - 10.5 K/uL 9.9  8.9  8.8   Hemoglobin 13.0 - 17.0 g/dL 9.3  8.3  7.7   Hematocrit 39.0 - 52.0 % 30.8  27.8  25.2   Platelets 150 - 400 K/uL 230  260  274     Lab Results  Component Value Date   NA 139 04/13/2023   K 3.7 04/13/2023   CL 103 04/13/2023   CO2 26 04/13/2023      Assessment/Plan: AKI on CKD stage IIIb AKI likely hemodynamically mediated in the setting of bacteremia UA negative for proteinuria Renal ultrasound without hydronephrosis Renal function improved with supportive care and back to baseline   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  Mechanical fall/generalized weakness Likely due to debility in the setting of AKI/bacteremia Imaging negative for fractures PT/OT eval-plan is for SNF  Acute on chronic HFrEF Volume status improved Continue Demadex to 20 mg daily.  PAF Telemetry monitoring Discussion with orthopedics on 10/24--Eliquis resumed  Normocytic anemia Due to acute illness/AKI-no indication of GI loss-s/p 1 unit of PRBC on 10/26-Hb now stable and improved Follow CBC periodically  DM-2 CBG stable SSI  Recent Labs    04/12/23 1605 04/12/23 2129 04/13/23 1027  PROGRESS NOTE        PATIENT DETAILS Name: Tanner [name] Age: 77 y.o. Sex: male Date of Birth: June 04, 1946 Admit Date: 04/06/2023 Admitting Physician Angie Fava, DO QIO:[id number], [facility]  Brief Summary: Patient is a 77 y.o.  male chronic HFrEF, DM-2, HTN, PAF on Eliquis-sent right knee arthroplasty 12/15/2022-presented with generalized weakness/ground-level fall-along with right knee discomfort for the past several days-upon further evaluation-he was found to have AKI with polymicrobial bacteremia-likely secondary to periprosthetic right knee infection (gram-negative rod on synovial fluid culture)  Significant events: 10/22>> admit to [facility]-I&D of right knee abscess by ED physician.  Significant studies: 3/6>> echo: EF 45-50% 10/23>> CXR: No PNA 10/23>> CT head: No acute intracranial abnormality 10/23>> CT C-spine: No fracture 10/23>> CT lumbar spine: No fracture 10/23>> x-ray right knee: Right knee arthroplasty without complication-joint effusion with subcutaneous edema anteriorly. 10/23>> renal ultrasound: No hydronephrosis. 1023>> echocardiogram: EF 60-65%, no obvious vegetations identified. 10/28>> TEE: No vegetations  Significant microbiology data: 10/22>> blood culture: MRSA/Enterococcus faecalis 10/22>> superficial knee culture: MRSA 10/23>> intraoperative right knee culture: MRSA 10/24>> blood culture: Negative  Procedures: 10/22>> superficial right knee abscess I&D by ED physician 10/23>> excisional/nonexcisional debridement of right knee, polyethylene revision-Dr. Charlann Boxer. 10/29>> PICC line placement  Consults: Infectious disease Orthopedics  Subjective: No complaints-lying comfortably in bed.  Objective: Vitals: Blood pressure (!) 158/69, pulse 77, temperature 98.2 F (36.8 C), temperature source Oral, resp. rate 19, height 5\' 4"  (1.626 m), weight 100 kg, SpO2 92%.   Exam: Gen Exam:Alert awake-not in any  distress HEENT:atraumatic, normocephalic Chest: B/L clear to auscultation anteriorly CVS:S1S2 regular Abdomen:soft non tender, non distended Extremities: Trace edema-see picture below left leg. Neurology: Non focal Skin: no rash    Pertinent Labs/Radiology:    Latest Ref Rng & Units 04/13/2023    3:28 AM 04/12/2023    3:30 AM 04/11/2023    4:17 AM  CBC  WBC 4.0 - 10.5 K/uL 9.9  8.9  8.8   Hemoglobin 13.0 - 17.0 g/dL 9.3  8.3  7.7   Hematocrit 39.0 - 52.0 % 30.8  27.8  25.2   Platelets 150 - 400 K/uL 230  260  274     Lab Results  Component Value Date   NA 139 04/13/2023   K 3.7 04/13/2023   CL 103 04/13/2023   CO2 26 04/13/2023      Assessment/Plan: AKI on CKD stage IIIb AKI likely hemodynamically mediated in the setting of bacteremia UA negative for proteinuria Renal ultrasound without hydronephrosis Renal function improved with supportive care and back to baseline   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  Mechanical fall/generalized weakness Likely due to debility in the setting of AKI/bacteremia Imaging negative for fractures PT/OT eval-plan is for SNF  Acute on chronic HFrEF Volume status improved Continue Demadex to 20 mg daily.  PAF Telemetry monitoring Discussion with orthopedics on 10/24--Eliquis resumed  Normocytic anemia Due to acute illness/AKI-no indication of GI loss-s/p 1 unit of PRBC on 10/26-Hb now stable and improved Follow CBC periodically  DM-2 CBG stable SSI  Recent Labs    04/12/23 1605 04/12/23 2129 04/13/23 1027  PROGRESS NOTE        PATIENT DETAILS Name: Tanner [name] Age: 77 y.o. Sex: male Date of Birth: June 04, 1946 Admit Date: 04/06/2023 Admitting Physician Angie Fava, DO QIO:[id number], [facility]  Brief Summary: Patient is a 77 y.o.  male chronic HFrEF, DM-2, HTN, PAF on Eliquis-sent right knee arthroplasty 12/15/2022-presented with generalized weakness/ground-level fall-along with right knee discomfort for the past several days-upon further evaluation-he was found to have AKI with polymicrobial bacteremia-likely secondary to periprosthetic right knee infection (gram-negative rod on synovial fluid culture)  Significant events: 10/22>> admit to [facility]-I&D of right knee abscess by ED physician.  Significant studies: 3/6>> echo: EF 45-50% 10/23>> CXR: No PNA 10/23>> CT head: No acute intracranial abnormality 10/23>> CT C-spine: No fracture 10/23>> CT lumbar spine: No fracture 10/23>> x-ray right knee: Right knee arthroplasty without complication-joint effusion with subcutaneous edema anteriorly. 10/23>> renal ultrasound: No hydronephrosis. 1023>> echocardiogram: EF 60-65%, no obvious vegetations identified. 10/28>> TEE: No vegetations  Significant microbiology data: 10/22>> blood culture: MRSA/Enterococcus faecalis 10/22>> superficial knee culture: MRSA 10/23>> intraoperative right knee culture: MRSA 10/24>> blood culture: Negative  Procedures: 10/22>> superficial right knee abscess I&D by ED physician 10/23>> excisional/nonexcisional debridement of right knee, polyethylene revision-Dr. Charlann Boxer. 10/29>> PICC line placement  Consults: Infectious disease Orthopedics  Subjective: No complaints-lying comfortably in bed.  Objective: Vitals: Blood pressure (!) 158/69, pulse 77, temperature 98.2 F (36.8 C), temperature source Oral, resp. rate 19, height 5\' 4"  (1.626 m), weight 100 kg, SpO2 92%.   Exam: Gen Exam:Alert awake-not in any  distress HEENT:atraumatic, normocephalic Chest: B/L clear to auscultation anteriorly CVS:S1S2 regular Abdomen:soft non tender, non distended Extremities: Trace edema-see picture below left leg. Neurology: Non focal Skin: no rash    Pertinent Labs/Radiology:    Latest Ref Rng & Units 04/13/2023    3:28 AM 04/12/2023    3:30 AM 04/11/2023    4:17 AM  CBC  WBC 4.0 - 10.5 K/uL 9.9  8.9  8.8   Hemoglobin 13.0 - 17.0 g/dL 9.3  8.3  7.7   Hematocrit 39.0 - 52.0 % 30.8  27.8  25.2   Platelets 150 - 400 K/uL 230  260  274     Lab Results  Component Value Date   NA 139 04/13/2023   K 3.7 04/13/2023   CL 103 04/13/2023   CO2 26 04/13/2023      Assessment/Plan: AKI on CKD stage IIIb AKI likely hemodynamically mediated in the setting of bacteremia UA negative for proteinuria Renal ultrasound without hydronephrosis Renal function improved with supportive care and back to baseline   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  Mechanical fall/generalized weakness Likely due to debility in the setting of AKI/bacteremia Imaging negative for fractures PT/OT eval-plan is for SNF  Acute on chronic HFrEF Volume status improved Continue Demadex to 20 mg daily.  PAF Telemetry monitoring Discussion with orthopedics on 10/24--Eliquis resumed  Normocytic anemia Due to acute illness/AKI-no indication of GI loss-s/p 1 unit of PRBC on 10/26-Hb now stable and improved Follow CBC periodically  DM-2 CBG stable SSI  Recent Labs    04/12/23 1605 04/12/23 2129 04/13/23 1027  PROGRESS NOTE        PATIENT DETAILS Name: Tanner [name] Age: 77 y.o. Sex: male Date of Birth: June 04, 1946 Admit Date: 04/06/2023 Admitting Physician Angie Fava, DO QIO:[id number], [facility]  Brief Summary: Patient is a 77 y.o.  male chronic HFrEF, DM-2, HTN, PAF on Eliquis-sent right knee arthroplasty 12/15/2022-presented with generalized weakness/ground-level fall-along with right knee discomfort for the past several days-upon further evaluation-he was found to have AKI with polymicrobial bacteremia-likely secondary to periprosthetic right knee infection (gram-negative rod on synovial fluid culture)  Significant events: 10/22>> admit to [facility]-I&D of right knee abscess by ED physician.  Significant studies: 3/6>> echo: EF 45-50% 10/23>> CXR: No PNA 10/23>> CT head: No acute intracranial abnormality 10/23>> CT C-spine: No fracture 10/23>> CT lumbar spine: No fracture 10/23>> x-ray right knee: Right knee arthroplasty without complication-joint effusion with subcutaneous edema anteriorly. 10/23>> renal ultrasound: No hydronephrosis. 1023>> echocardiogram: EF 60-65%, no obvious vegetations identified. 10/28>> TEE: No vegetations  Significant microbiology data: 10/22>> blood culture: MRSA/Enterococcus faecalis 10/22>> superficial knee culture: MRSA 10/23>> intraoperative right knee culture: MRSA 10/24>> blood culture: Negative  Procedures: 10/22>> superficial right knee abscess I&D by ED physician 10/23>> excisional/nonexcisional debridement of right knee, polyethylene revision-Dr. Charlann Boxer. 10/29>> PICC line placement  Consults: Infectious disease Orthopedics  Subjective: No complaints-lying comfortably in bed.  Objective: Vitals: Blood pressure (!) 158/69, pulse 77, temperature 98.2 F (36.8 C), temperature source Oral, resp. rate 19, height 5\' 4"  (1.626 m), weight 100 kg, SpO2 92%.   Exam: Gen Exam:Alert awake-not in any  distress HEENT:atraumatic, normocephalic Chest: B/L clear to auscultation anteriorly CVS:S1S2 regular Abdomen:soft non tender, non distended Extremities: Trace edema-see picture below left leg. Neurology: Non focal Skin: no rash    Pertinent Labs/Radiology:    Latest Ref Rng & Units 04/13/2023    3:28 AM 04/12/2023    3:30 AM 04/11/2023    4:17 AM  CBC  WBC 4.0 - 10.5 K/uL 9.9  8.9  8.8   Hemoglobin 13.0 - 17.0 g/dL 9.3  8.3  7.7   Hematocrit 39.0 - 52.0 % 30.8  27.8  25.2   Platelets 150 - 400 K/uL 230  260  274     Lab Results  Component Value Date   NA 139 04/13/2023   K 3.7 04/13/2023   CL 103 04/13/2023   CO2 26 04/13/2023      Assessment/Plan: AKI on CKD stage IIIb AKI likely hemodynamically mediated in the setting of bacteremia UA negative for proteinuria Renal ultrasound without hydronephrosis Renal function improved with supportive care and back to baseline   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  Mechanical fall/generalized weakness Likely due to debility in the setting of AKI/bacteremia Imaging negative for fractures PT/OT eval-plan is for SNF  Acute on chronic HFrEF Volume status improved Continue Demadex to 20 mg daily.  PAF Telemetry monitoring Discussion with orthopedics on 10/24--Eliquis resumed  Normocytic anemia Due to acute illness/AKI-no indication of GI loss-s/p 1 unit of PRBC on 10/26-Hb now stable and improved Follow CBC periodically  DM-2 CBG stable SSI  Recent Labs    04/12/23 1605 04/12/23 2129 04/13/23 1027

## 2023-04-13 NOTE — Progress Notes (Signed)
Left leg sutures removed.

## 2023-04-13 NOTE — Progress Notes (Signed)
[facility]  Date of Admission:  04/06/2023   Total days of inpatient antibiotics 6  Principal Problem:   Infection of prosthetic right knee joint (HCC) Active Problems:   Essential hypertension   Acute renal failure superimposed on stage 3b chronic kidney disease (HCC)   DM2 (diabetes mellitus, type 2) (HCC)   Chronic systolic CHF (congestive heart failure) (HCC)   Paroxysmal atrial fibrillation (HCC)   Generalized weakness   Fall at home, initial encounter   Acute on chronic anemia   Hyperlipidemia   MRSA bacteremia   Enterococcal bacteremia   Streptococcal bacteremia          Assessment:  77 year old male with history of heart chronic systolic heart failure, diabetes melitis complicated by diabetic peripheral polyneuropathy, essential hypertension, hyperlipidemia, and A-fib on Eliquis, CKD stage III admitted to [facility] with concern for cellulitis of the right lower extremity.  #Polymicrobial bacteremia with E faecalis, MRSA secondary to right TKA #PJI right knee status post debridement and polyethylene exchange on 10/23 -Patient had underwent right TKA on 7/24 with Dr. Charlann Boxer. and notes increased pain for the last few days at the site.  - On arrival to the ED patient  20K.  Afebrile.   - Blood cultures obtained with BC ID positive for E faecalis, MRSA, strep species.  Blood cultures grew E faecalis and MRSA 1/2 - 10/22 knee culture form I&D in ED growing MRSA  with Gram stain GNR taken to the OR on 10/23 with orthopedics for right knee debridement, polyethylene revision. -   Purulence noted in the OR, cultures growing MRSA -TEE no veg Recommendations: - D/C vancomycin. Follow Dapto sens(GNR from knee aspirate did not grow, only MRSA as suck will target MRSA) -Start daptomycin to complete 6 weeks of abx form OR EOT 12/4 then transition to doxy 100mg  PO bid for chronic suppression -F/U ortho outpatient - Follow-up with infectious disease  outpatient   ID will sign off  OPAT ORDERS:  Diagnosis: Polymicrobial bacteremia with E faecalis, MRSA, secondary to right TKA  Culture Result: blood and OR Cx+ MRSA  Allergies  Allergen Reactions   Lisinopril Other (See Comments)     Renal impairment   Amlodipine Swelling   Codeine Sulfate Itching and Nausea Only     Discharge antibiotics to be given via PICC line:  Per pharmacy protocol Daptomycin 750 mg IV every 24 hours    Duration: 6 weeks End Date: 05/19/23  [facility] Care Per Protocol with [employer] Use: Home health RN for IV administration and teaching, line care and labs.    Labs weekly while on IV antibiotics: x__ CBC with differential __ BMP **TWICE WEEKLY ON VANCOMYCIN  __x CMP __x CRP __x ESR __ Vancomycin trough TWICE WEEKLY __ CK  __ Please pull PIC at completion of IV antibiotics __x Please leave PIC in place until doctor has seen patient or been notified  Fax weekly labs to [phone]  Clinic Follow Up Appt: 11/13  @ [facility] with Dr. Thedore Mins  Microbiology:   Antibiotics: Vancomycin 10/22 Ceftriaxone 10/22 >>  Cultures: Blood 10/20 to 1/2 E faecalis and MRSA 10/24 NG Urine  Other 10/22 knee aspirate MRSA, Gram stain showing GNR - 10/23 Staph aureus, reintubating  SUBJECTIVE: In chair.  No new complaints. Interval: Afebrile overnight.  WBC 9.9 Review of Systems: Review of Systems  All other systems reviewed and are negative.    Scheduled Meds:  apixaban  5  mg Oral BID   carvedilol  6.25 mg Oral BID WC   Chlorhexidine Gluconate Cloth  6 each Topical Daily   finasteride  5 mg Oral QHS   insulin aspart  0-6 Units Subcutaneous TID WC   pantoprazole  40 mg Oral Q1200   polyethylene glycol  17 g Oral BID   senna  2 tablet Oral QHS   sodium chloride flush  10-40 mL Intracatheter Q12H   torsemide  20 mg Oral Daily   Continuous Infusions:  DAPTOmycin     PRN Meds:.alum & mag hydroxide-simeth, bisacodyl, diphenhydrAMINE,  HYDROmorphone (DILAUDID) injection, melatonin, menthol-cetylpyridinium **OR** phenol, methocarbamol **OR** methocarbamol (ROBAXIN) injection, metoCLOPramide **OR** metoCLOPramide (REGLAN) injection, naLOXone (NARCAN)  injection, ondansetron **OR** ondansetron (ZOFRAN) IV, oxyCODONE, oxyCODONE, sodium chloride flush Allergies  Allergen Reactions   Lisinopril Other (See Comments)     Renal impairment   Amlodipine Swelling   Codeine Sulfate Itching and Nausea Only    OBJECTIVE: Vitals:   04/12/23 2314 04/13/23 0329 04/13/23 0815 04/13/23 1245  BP: (!) 149/70 (!) 152/62 (!) 158/69 (!) 151/64  Pulse: 70 69 77 67  Resp: 17 15 19 19   Temp: 98.1 F (36.7 C) 98 F (36.7 C) 98.2 F (36.8 C) 98.3 F (36.8 C)  TempSrc: Oral Oral Oral Oral  SpO2:  92%    Weight:      Height:       Body mass index is 37.84 kg/m.  Physical Exam Constitutional:      General: He is not in acute distress.    Appearance: He is normal weight. He is not toxic-appearing.  HENT:     Head: Normocephalic and atraumatic.     Right Ear: External ear normal.     Left Ear: External ear normal.     Nose: No congestion or rhinorrhea.     Mouth/Throat:     Mouth: Mucous membranes are moist.     Pharynx: Oropharynx is clear.  Eyes:     Extraocular Movements: Extraocular movements intact.     Conjunctiva/sclera: Conjunctivae normal.     Pupils: Pupils are equal, round, and reactive to light.  Cardiovascular:     Rate and Rhythm: Normal rate and regular rhythm.     Heart sounds: No murmur heard.    No friction rub. No gallop.  Pulmonary:     Effort: Pulmonary effort is normal.     Breath sounds: Normal breath sounds.  Abdominal:     General: Abdomen is flat. Bowel sounds are normal.     Palpations: Abdomen is soft.  Musculoskeletal:        General: No swelling.     Cervical back: Normal range of motion and neck supple.     Comments: Right knee bandaged  Skin:    General: Skin is warm and dry.  Neurological:      General: No focal deficit present.     Mental Status: He is oriented to person, place, and time.  Psychiatric:        Mood and Affect: Mood normal.       Lab Results Lab Results  Component Value Date   WBC 9.9 04/13/2023   HGB 9.3 (L) 04/13/2023   HCT 30.8 (L) 04/13/2023   MCV 80.6 04/13/2023   PLT 230 04/13/2023    Lab Results  Component Value Date   CREATININE 1.91 (H) 04/13/2023   BUN 30 (H) 04/13/2023   NA 139 04/13/2023   K 3.7 04/13/2023   CL 103 04/13/2023  CO2 26 04/13/2023    Lab Results  Component Value Date   ALT 19 04/08/2023   AST 21 04/08/2023   ALKPHOS 68 04/08/2023   BILITOT 0.4 04/08/2023        Danelle Earthly, MD [facility] 04/13/2023, 1:42 PM   I have personally spent 52 minutes involved in face-to-face and non-face-to-face activities for this patient on the day of the visit. Professional time spent includes the following activities: Preparing to see the patient (review of tests), Obtaining and/or reviewing separately obtained history (admission/discharge record), Performing a medically appropriate examination and/or evaluation , Ordering medications/tests/procedures, referring and communicating with other health care professionals, Documenting clinical information in the EMR, Independently interpreting results (not separately reported), Communicating results to the patient/family/caregiver, Counseling and educating the patient/family/caregiver and Care coordination (not separately reported).

## 2023-04-14 ENCOUNTER — Encounter: Payer: Self-pay | Admitting: *Deleted

## 2023-04-14 ENCOUNTER — Ambulatory Visit (HOSPITAL_COMMUNITY): Payer: Self-pay

## 2023-04-14 DIAGNOSIS — B9562 Methicillin resistant Staphylococcus aureus infection as the cause of diseases classified elsewhere: Secondary | ICD-10-CM | POA: Diagnosis not present

## 2023-04-14 DIAGNOSIS — B952 Enterococcus as the cause of diseases classified elsewhere: Secondary | ICD-10-CM | POA: Diagnosis not present

## 2023-04-14 DIAGNOSIS — I480 Paroxysmal atrial fibrillation: Secondary | ICD-10-CM | POA: Diagnosis not present

## 2023-04-14 DIAGNOSIS — F329 Major depressive disorder, single episode, unspecified: Secondary | ICD-10-CM | POA: Diagnosis not present

## 2023-04-14 DIAGNOSIS — R278 Other lack of coordination: Secondary | ICD-10-CM | POA: Diagnosis not present

## 2023-04-14 DIAGNOSIS — N183 Chronic kidney disease, stage 3 unspecified: Secondary | ICD-10-CM | POA: Diagnosis not present

## 2023-04-14 DIAGNOSIS — I5022 Chronic systolic (congestive) heart failure: Secondary | ICD-10-CM | POA: Diagnosis not present

## 2023-04-14 DIAGNOSIS — T8453XD Infection and inflammatory reaction due to internal right knee prosthesis, subsequent encounter: Secondary | ICD-10-CM | POA: Diagnosis not present

## 2023-04-14 DIAGNOSIS — E785 Hyperlipidemia, unspecified: Secondary | ICD-10-CM | POA: Diagnosis not present

## 2023-04-14 DIAGNOSIS — M6281 Muscle weakness (generalized): Secondary | ICD-10-CM | POA: Diagnosis not present

## 2023-04-14 DIAGNOSIS — J9601 Acute respiratory failure with hypoxia: Secondary | ICD-10-CM | POA: Diagnosis not present

## 2023-04-14 DIAGNOSIS — R7881 Bacteremia: Secondary | ICD-10-CM | POA: Diagnosis not present

## 2023-04-14 DIAGNOSIS — E1121 Type 2 diabetes mellitus with diabetic nephropathy: Secondary | ICD-10-CM | POA: Diagnosis not present

## 2023-04-14 DIAGNOSIS — N1832 Chronic kidney disease, stage 3b: Secondary | ICD-10-CM | POA: Diagnosis not present

## 2023-04-14 DIAGNOSIS — R2689 Other abnormalities of gait and mobility: Secondary | ICD-10-CM | POA: Diagnosis not present

## 2023-04-14 DIAGNOSIS — L89323 Pressure ulcer of left buttock, stage 3: Secondary | ICD-10-CM | POA: Diagnosis not present

## 2023-04-14 DIAGNOSIS — N170 Acute kidney failure with tubular necrosis: Secondary | ICD-10-CM | POA: Diagnosis not present

## 2023-04-14 DIAGNOSIS — I10 Essential (primary) hypertension: Secondary | ICD-10-CM | POA: Diagnosis not present

## 2023-04-14 DIAGNOSIS — K219 Gastro-esophageal reflux disease without esophagitis: Secondary | ICD-10-CM | POA: Diagnosis not present

## 2023-04-14 DIAGNOSIS — J690 Pneumonitis due to inhalation of food and vomit: Secondary | ICD-10-CM | POA: Diagnosis not present

## 2023-04-14 DIAGNOSIS — F338 Other recurrent depressive disorders: Secondary | ICD-10-CM

## 2023-04-14 LAB — CULTURE, BLOOD (ROUTINE X 2)
Culture: NO GROWTH
Culture: NO GROWTH
Special Requests: ADEQUATE
Special Requests: ADEQUATE

## 2023-04-14 LAB — GLUCOSE, CAPILLARY
Glucose-Capillary: 84 mg/dL (ref 70–99)
Glucose-Capillary: 97 mg/dL (ref 70–99)

## 2023-04-14 MED ORDER — DAPTOMYCIN IV (FOR PTA / DISCHARGE USE ONLY): 750.0000 mg | INTRAVENOUS | 0 refills | Status: DC

## 2023-04-14 MED ORDER — POLYETHYLENE GLYCOL 3350 17 G PO PACK: 17.0000 g | PACK | Freq: Every day | ORAL | Status: AC

## 2023-04-14 MED ORDER — PANTOPRAZOLE SODIUM 40 MG PO TBEC: 40.0000 mg | DELAYED_RELEASE_TABLET | Freq: Every day | ORAL | Status: AC

## 2023-04-14 MED ORDER — SENNA 8.6 MG PO TABS: 2.0000 | ORAL_TABLET | Freq: Every day | ORAL | Status: AC

## 2023-04-14 NOTE — Discharge Summary (Addendum)
PATIENT DETAILS Name: Tanner [name] Age: 77 y.o. Sex: male Date of Birth: 04/08/1946 MRN: [id number]. Admitting Physician: Angie Fava, DO GYI:[id number], Veterans  Admit Date: 04/06/2023 Discharge date: 04/14/2023  Recommendations for Outpatient Follow-up:  Follow up with PCP in 1-2 weeks Daptomycin until 12/4-then transition to doxycycline 100 mg p.o. twice daily for chronic suppression Follow-up with infectious disease as outpatient Ensure follow-up with orthopedics as outpatient Send weekly labs to infectious disease office-see below.  Admitted From:  Home  Disposition: Skilled nursing facility   Discharge Condition: good  CODE STATUS:   Code Status: Full Code   Diet recommendation:  Diet Order             Diet - low sodium heart healthy           Diet general           Diet heart healthy/carb modified Fluid consistency: Thin  Diet effective now                    Brief Summary: Patient is a 77 y.o.  male chronic HFrEF, DM-2, HTN, PAF on Eliquis-sent right knee arthroplasty 12/15/2022-presented with generalized weakness/ground-level fall-along with right knee discomfort for the past several days-upon further evaluation-he was found to have AKI with polymicrobial bacteremia-likely secondary to periprosthetic right knee infection (gram-negative rod on synovial fluid culture)   Significant events: 10/22>> admit to [facility]-I&D of right knee abscess by ED physician.   Significant studies: 3/6>> echo: EF 45-50% 10/23>> CXR: No PNA 10/23>> CT head: No acute intracranial abnormality 10/23>> CT C-spine: No fracture 10/23>> CT lumbar spine: No fracture 10/23>> x-ray right knee: Right knee arthroplasty without complication-joint effusion with subcutaneous edema anteriorly. 10/23>> renal ultrasound: No hydronephrosis. 1023>> echocardiogram: EF 60-65%, no obvious vegetations identified. 10/28>> TEE: No vegetations   Significant microbiology  data: 10/22>> blood culture: MRSA/Enterococcus faecalis 10/22>> superficial knee culture: MRSA 10/23>> intraoperative right knee culture: MRSA 10/24>> blood culture: Negative   Procedures: 10/22>> superficial right knee abscess I&D by ED physician 10/23>> excisional/nonexcisional debridement of right knee, polyethylene revision-Dr. Charlann Boxer. 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 Please fax weekly labs to infectious disease clinic. Please ensure follow-up with ID/orthopedics   Mechanical fall/generalized weakness Likely due to debility in the setting of AKI/bacteremia Imaging negative for fractures PT/OT eval-plan is for SNF   Acute on chronic HFrEF Volume status stable Continue Demadex to 20 mg daily.   PAF Continue/Coreg   Normocytic anemia Due to acute illness/AKI-no indication of GI loss-s/p 1 unit of PRBC on 10/26-Hb now stable and improved Follow CBC periodically   DM-2 CBG stable Resume Jardiance on discharge Infection issues are now stable.  HTN BP relatively stable-continue Coreg   BPH Finasteride   Recent history of left leg laceration Sutured 10/18-during an ED visit Discussed with general surgical team-picture reviewed-sutures removed 10/29.  Okay to  remove sutures today.   Obesity:  Home infusion instructions - [facility] )        04/14/23 0903   04/14/23 0000  Discharge wound care:       Comments: Wound care every other day    Comments: Clean L lower anterior leg wounds with NS, apply Xeroform gauze [place] [id number]) to both wounds beds every other day, cover with Telfa nonstick gauze and Kerlix roll gauze starting just above toes and ending right below knee. May secure with Ace bandage wrapped in same fashion.   04/14/23 6295            Follow-up Information     Durene Romans, MD. Schedule an appointment as soon as possible for a visit in 2 week(s).   Specialty: Orthopedic Surgery Contact information: [address] [place] [address] [place] [address] [phone]         Danelle Earthly, MD Follow up on 04/28/2023.   Specialty: Infectious Diseases Why: Appointment at 3 PM Contact information: [address] [place] [address] [phone]                Allergies  Allergen Reactions   Lisinopril Other (See Comments)     Renal impairment   Amlodipine Swelling   Codeine Sulfate Itching and Nausea Only     Other Procedures/Studies: ECHO TEE  Result Date: 04/12/2023    TRANSESOPHOGEAL  ECHO REPORT   Patient Name:   Tanner [name] Date of Exam: 04/12/2023 Medical Rec #:  [id number]      Height:       64.0 in Accession #:    [id number]     Weight:       220.5 lb Date of Birth:  June 27, 1945      BSA:          2.039 m Patient Age:    61 years       BP:           101/79 mmHg Patient Gender: M              HR:           71 bpm. Exam Location:  Inpatient Procedure: Transesophageal Echo, Color Doppler and Cardiac Doppler Indications:    Bacteremia  History:        Patient has prior history of Echocardiogram examinations, most                 recent 04/07/2023. CHF; Risk Factors:Sleep Apnea, Hypertension                 and Diabetes. Hx of PE, CKD.  Sonographer:    Milda Smart Referring Phys: Corrin Parker PROCEDURE: After discussion of the risks and benefits of a TEE, an informed consent was obtained from the patient. TEE procedure time was 7 minutes. The transesophogeal probe was passed without difficulty through the esophogus of the patient. Imaged were  obtained with the patient in a supine position. Local oropharyngeal anesthetic was provided with Cetacaine. Sedation performed by different physician. The patient was monitored while under deep sedation. Anesthestetic sedation was provided intravenously  by Anesthesiology: 202.1mg  of Propofol, 100mg  of Lidocaine. Image quality was good. [name] vital signs; including heart rate, blood pressure, and oxygen saturation; remained stable throughout the procedure. The patient developed no complications during the procedure.  IMPRESSIONS  1. Left ventricular ejection fraction, by estimation, is 60 to 65%. The left ventricle has normal function.  2. Right ventricular systolic function is normal. The  Home infusion instructions - [facility] )        04/14/23 0903   04/14/23 0000  Discharge wound care:       Comments: Wound care every other day    Comments: Clean L lower anterior leg wounds with NS, apply Xeroform gauze [place] [id number]) to both wounds beds every other day, cover with Telfa nonstick gauze and Kerlix roll gauze starting just above toes and ending right below knee. May secure with Ace bandage wrapped in same fashion.   04/14/23 6295            Follow-up Information     Durene Romans, MD. Schedule an appointment as soon as possible for a visit in 2 week(s).   Specialty: Orthopedic Surgery Contact information: [address] [place] [address] [place] [address] [phone]         Danelle Earthly, MD Follow up on 04/28/2023.   Specialty: Infectious Diseases Why: Appointment at 3 PM Contact information: [address] [place] [address] [phone]                Allergies  Allergen Reactions   Lisinopril Other (See Comments)     Renal impairment   Amlodipine Swelling   Codeine Sulfate Itching and Nausea Only     Other Procedures/Studies: ECHO TEE  Result Date: 04/12/2023    TRANSESOPHOGEAL  ECHO REPORT   Patient Name:   Tanner [name] Date of Exam: 04/12/2023 Medical Rec #:  [id number]      Height:       64.0 in Accession #:    [id number]     Weight:       220.5 lb Date of Birth:  June 27, 1945      BSA:          2.039 m Patient Age:    61 years       BP:           101/79 mmHg Patient Gender: M              HR:           71 bpm. Exam Location:  Inpatient Procedure: Transesophageal Echo, Color Doppler and Cardiac Doppler Indications:    Bacteremia  History:        Patient has prior history of Echocardiogram examinations, most                 recent 04/07/2023. CHF; Risk Factors:Sleep Apnea, Hypertension                 and Diabetes. Hx of PE, CKD.  Sonographer:    Milda Smart Referring Phys: Corrin Parker PROCEDURE: After discussion of the risks and benefits of a TEE, an informed consent was obtained from the patient. TEE procedure time was 7 minutes. The transesophogeal probe was passed without difficulty through the esophogus of the patient. Imaged were  obtained with the patient in a supine position. Local oropharyngeal anesthetic was provided with Cetacaine. Sedation performed by different physician. The patient was monitored while under deep sedation. Anesthestetic sedation was provided intravenously  by Anesthesiology: 202.1mg  of Propofol, 100mg  of Lidocaine. Image quality was good. [name] vital signs; including heart rate, blood pressure, and oxygen saturation; remained stable throughout the procedure. The patient developed no complications during the procedure.  IMPRESSIONS  1. Left ventricular ejection fraction, by estimation, is 60 to 65%. The left ventricle has normal function.  2. Right ventricular systolic function is normal. The  Date: 03/18/2023  Lower Venous DVT Study Patient Name:   FREDRIK HOLDERBY  Date of Exam:   03/18/2023 Medical Rec #: [id number]       Accession #:    [id number] Date of Birth: 04-04-1946       Patient Gender: M Patient Age:   27 years Exam Location:  [facility] Procedure:      VAS [name] LOWER EXTREMITY VENOUS (DVT) Referring Phys: Dow Adolph --------------------------------------------------------------------------------  Indications: Edema. Status post fall on right knee, knee effusion by X-ray. Right TKR 12/15/22  Comparison Study: Prior negative Right LEV done 01/07/22 Performing Technologist: Sherren Kerns RVS  Examination Guidelines: A complete evaluation includes B-mode imaging, spectral Doppler, color Doppler, and power Doppler as needed of all accessible portions of each vessel. Bilateral testing is considered an integral part of a complete examination. Limited examinations for reoccurring indications may be performed as noted. The reflux portion of the exam is performed with the patient in reverse Trendelenburg.  +--------+---------------+---------+-----------+----------------+-------------+ RIGHT   CompressibilityPhasicitySpontaneityProperties      Thrombus                                                                 Aging         +--------+---------------+---------+-----------+----------------+-------------+ CFV     Full                               pulsatile                                                                waveforms                     +--------+---------------+---------+-----------+----------------+-------------+ SFJ     Full                                                             +--------+---------------+---------+-----------+----------------+-------------+ FV Prox Full                               pulsatile                                                                waveforms                     +--------+---------------+---------+-----------+----------------+-------------+ FV Mid   Full                               pulsatile  PATIENT DETAILS Name: Tanner [name] Age: 77 y.o. Sex: male Date of Birth: 04/08/1946 MRN: [id number]. Admitting Physician: Angie Fava, DO GYI:[id number], Veterans  Admit Date: 04/06/2023 Discharge date: 04/14/2023  Recommendations for Outpatient Follow-up:  Follow up with PCP in 1-2 weeks Daptomycin until 12/4-then transition to doxycycline 100 mg p.o. twice daily for chronic suppression Follow-up with infectious disease as outpatient Ensure follow-up with orthopedics as outpatient Send weekly labs to infectious disease office-see below.  Admitted From:  Home  Disposition: Skilled nursing facility   Discharge Condition: good  CODE STATUS:   Code Status: Full Code   Diet recommendation:  Diet Order             Diet - low sodium heart healthy           Diet general           Diet heart healthy/carb modified Fluid consistency: Thin  Diet effective now                    Brief Summary: Patient is a 77 y.o.  male chronic HFrEF, DM-2, HTN, PAF on Eliquis-sent right knee arthroplasty 12/15/2022-presented with generalized weakness/ground-level fall-along with right knee discomfort for the past several days-upon further evaluation-he was found to have AKI with polymicrobial bacteremia-likely secondary to periprosthetic right knee infection (gram-negative rod on synovial fluid culture)   Significant events: 10/22>> admit to [facility]-I&D of right knee abscess by ED physician.   Significant studies: 3/6>> echo: EF 45-50% 10/23>> CXR: No PNA 10/23>> CT head: No acute intracranial abnormality 10/23>> CT C-spine: No fracture 10/23>> CT lumbar spine: No fracture 10/23>> x-ray right knee: Right knee arthroplasty without complication-joint effusion with subcutaneous edema anteriorly. 10/23>> renal ultrasound: No hydronephrosis. 1023>> echocardiogram: EF 60-65%, no obvious vegetations identified. 10/28>> TEE: No vegetations   Significant microbiology  data: 10/22>> blood culture: MRSA/Enterococcus faecalis 10/22>> superficial knee culture: MRSA 10/23>> intraoperative right knee culture: MRSA 10/24>> blood culture: Negative   Procedures: 10/22>> superficial right knee abscess I&D by ED physician 10/23>> excisional/nonexcisional debridement of right knee, polyethylene revision-Dr. Charlann Boxer. 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 Please fax weekly labs to infectious disease clinic. Please ensure follow-up with ID/orthopedics   Mechanical fall/generalized weakness Likely due to debility in the setting of AKI/bacteremia Imaging negative for fractures PT/OT eval-plan is for SNF   Acute on chronic HFrEF Volume status stable Continue Demadex to 20 mg daily.   PAF Continue/Coreg   Normocytic anemia Due to acute illness/AKI-no indication of GI loss-s/p 1 unit of PRBC on 10/26-Hb now stable and improved Follow CBC periodically   DM-2 CBG stable Resume Jardiance on discharge Infection issues are now stable.  HTN BP relatively stable-continue Coreg   BPH Finasteride   Recent history of left leg laceration Sutured 10/18-during an ED visit Discussed with general surgical team-picture reviewed-sutures removed 10/29.  Okay to  remove sutures today.   Obesity:  Home infusion instructions - [facility] )        04/14/23 0903   04/14/23 0000  Discharge wound care:       Comments: Wound care every other day    Comments: Clean L lower anterior leg wounds with NS, apply Xeroform gauze [place] [id number]) to both wounds beds every other day, cover with Telfa nonstick gauze and Kerlix roll gauze starting just above toes and ending right below knee. May secure with Ace bandage wrapped in same fashion.   04/14/23 6295            Follow-up Information     Durene Romans, MD. Schedule an appointment as soon as possible for a visit in 2 week(s).   Specialty: Orthopedic Surgery Contact information: [address] [place] [address] [place] [address] [phone]         Danelle Earthly, MD Follow up on 04/28/2023.   Specialty: Infectious Diseases Why: Appointment at 3 PM Contact information: [address] [place] [address] [phone]                Allergies  Allergen Reactions   Lisinopril Other (See Comments)     Renal impairment   Amlodipine Swelling   Codeine Sulfate Itching and Nausea Only     Other Procedures/Studies: ECHO TEE  Result Date: 04/12/2023    TRANSESOPHOGEAL  ECHO REPORT   Patient Name:   Tanner [name] Date of Exam: 04/12/2023 Medical Rec #:  [id number]      Height:       64.0 in Accession #:    [id number]     Weight:       220.5 lb Date of Birth:  June 27, 1945      BSA:          2.039 m Patient Age:    61 years       BP:           101/79 mmHg Patient Gender: M              HR:           71 bpm. Exam Location:  Inpatient Procedure: Transesophageal Echo, Color Doppler and Cardiac Doppler Indications:    Bacteremia  History:        Patient has prior history of Echocardiogram examinations, most                 recent 04/07/2023. CHF; Risk Factors:Sleep Apnea, Hypertension                 and Diabetes. Hx of PE, CKD.  Sonographer:    Milda Smart Referring Phys: Corrin Parker PROCEDURE: After discussion of the risks and benefits of a TEE, an informed consent was obtained from the patient. TEE procedure time was 7 minutes. The transesophogeal probe was passed without difficulty through the esophogus of the patient. Imaged were  obtained with the patient in a supine position. Local oropharyngeal anesthetic was provided with Cetacaine. Sedation performed by different physician. The patient was monitored while under deep sedation. Anesthestetic sedation was provided intravenously  by Anesthesiology: 202.1mg  of Propofol, 100mg  of Lidocaine. Image quality was good. [name] vital signs; including heart rate, blood pressure, and oxygen saturation; remained stable throughout the procedure. The patient developed no complications during the procedure.  IMPRESSIONS  1. Left ventricular ejection fraction, by estimation, is 60 to 65%. The left ventricle has normal function.  2. Right ventricular systolic function is normal. The  right ventricular size is normal.  3. No left atrial/left atrial appendage thrombus was detected.  4. The mitral valve is normal in structure. Trivial mitral valve regurgitation.  5. The aortic valve is tricuspid. Aortic valve regurgitation is not  visualized.  6. There is mild (Grade II) plaque involving the aortic arch. Conclusion(s)/Recommendation(s): No evidence of vegetation/infective endocarditis on this transesophageael echocardiogram. FINDINGS  Left Ventricle: Left ventricular ejection fraction, by estimation, is 60 to 65%. The left ventricle has normal function. The left ventricular internal cavity size was normal in size. Right Ventricle: The right ventricular size is normal. No increase in right ventricular wall thickness. Right ventricular systolic function is normal. Left Atrium: Left atrial size was normal in size. No left atrial/left atrial appendage thrombus was detected. Right Atrium: Right atrial size was normal in size. Pericardium: There is no evidence of pericardial effusion. Mitral Valve: The mitral valve is normal in structure. Trivial mitral valve regurgitation. Tricuspid Valve: The tricuspid valve is normal in structure. Tricuspid valve regurgitation is trivial. Aortic Valve: The aortic valve is tricuspid. Aortic valve regurgitation is not visualized. Pulmonic Valve: The pulmonic valve was normal in structure. Pulmonic valve regurgitation is not visualized. Aorta: The aortic root and ascending aorta are structurally normal, with no evidence of dilitation. There is mild (Grade II) plaque involving the aortic arch. IAS/Shunts: No atrial level shunt detected by color flow Doppler. Additional Comments: Spectral Doppler performed.  AORTA Ao Asc diam: 2.90 cm Zoila Shutter MD Electronically signed by Zoila Shutter MD Signature Date/Time: 04/12/2023/6:20:11 PM    Final    EP STUDY  Result Date: 04/12/2023 See surgical note for result.  [name] EKG SITE RITE  Result Date: 04/12/2023 If Site Rite image not attached, placement could not be confirmed due to current cardiac rhythm.  US RENAL  Result Date: 04/08/2023 CLINICAL DATA:  Acute renal injury EXAM: RENAL / URINARY TRACT ULTRASOUND COMPLETE COMPARISON:  CT from 12/19/2013  FINDINGS: Right Kidney: Renal measurements: 9.0 x 4.0 x 4.5 cm. = volume: 83.4 mL. Renal cysts are noted in the lower pole of the right kidney measuring up to 3.8 cm. These are simple in nature. No follow-up is recommended. Left Kidney: Renal measurements: 10.6 x 4.3 x 5.2 cm. = volume: 125.2 mL. Renal cysts are noted in the lower pole of the left kidney. The largest of these measures 6.8 cm. These are also simple in nature. No further follow-up is recommended. Bladder: Appears normal for degree of bladder distention. Other: None. IMPRESSION: Bilateral simple cysts.  No other focal abnormality is noted. Electronically Signed   By: Alcide Clever M.D.   On: 04/08/2023 01:36   ECHOCARDIOGRAM COMPLETE  Result Date: 04/07/2023    ECHOCARDIOGRAM REPORT   Patient Name:   Tanner [name] Date of Exam: 04/07/2023 Medical Rec #:  [id number]      Height:       64.0 in Accession #:    [id number]     Weight:       222.2 lb Date of Birth:  1945/12/08      BSA:          2.046 m Patient Age:    77 years       BP:           157/125 mmHg Patient Gender: M              HR:           73 bpm. Exam Location:  Inpatient Procedure: 2D  Date: 03/18/2023  Lower Venous DVT Study Patient Name:   FREDRIK HOLDERBY  Date of Exam:   03/18/2023 Medical Rec #: [id number]       Accession #:    [id number] Date of Birth: 04-04-1946       Patient Gender: M Patient Age:   27 years Exam Location:  [facility] Procedure:      VAS [name] LOWER EXTREMITY VENOUS (DVT) Referring Phys: Dow Adolph --------------------------------------------------------------------------------  Indications: Edema. Status post fall on right knee, knee effusion by X-ray. Right TKR 12/15/22  Comparison Study: Prior negative Right LEV done 01/07/22 Performing Technologist: Sherren Kerns RVS  Examination Guidelines: A complete evaluation includes B-mode imaging, spectral Doppler, color Doppler, and power Doppler as needed of all accessible portions of each vessel. Bilateral testing is considered an integral part of a complete examination. Limited examinations for reoccurring indications may be performed as noted. The reflux portion of the exam is performed with the patient in reverse Trendelenburg.  +--------+---------------+---------+-----------+----------------+-------------+ RIGHT   CompressibilityPhasicitySpontaneityProperties      Thrombus                                                                 Aging         +--------+---------------+---------+-----------+----------------+-------------+ CFV     Full                               pulsatile                                                                waveforms                     +--------+---------------+---------+-----------+----------------+-------------+ SFJ     Full                                                             +--------+---------------+---------+-----------+----------------+-------------+ FV Prox Full                               pulsatile                                                                waveforms                     +--------+---------------+---------+-----------+----------------+-------------+ FV Mid   Full                               pulsatile  exam was performed according to the departmental dose-optimization program which includes automated exposure control, adjustment of the mA and/or kV according to patient size and/or use of iterative reconstruction technique. COMPARISON:  CT abdomen pelvis 12/19/2013 FINDINGS: Segmentation: 5 lumbar type vertebrae. Alignment: Right convex scoliosis with apex at L2. Grade 1 anterolisthesis at L4-5. Vertebrae: Multilevel degenerative endplate remodeling throughout the lumbar spine, but greatest at L1-2 and L2-3. No acute fracture. No evidence of discitis-osteomyelitis. Paraspinal and other soft tissues: Enlarged prostate gland. Calcific aortic atherosclerosis. There is an incompletely visualized left renal contour abnormality that could be a  solid mass. Disc levels: L1-2: Disc bulge and facet arthrosis with mild spinal canal stenosis. Mild left foraminal stenosis. L2-3: Disc bulge and facet arthrosis with moderate spinal canal stenosis. L3-4: Disc bulge and facet arthrosis with mild spinal canal stenosis. Mild left foraminal stenosis. L4-5: Severe facet arthrosis with disc bulge and anterolisthesis. Severe spinal canal stenosis. Mild right foraminal stenosis. L5-S1: Unremarkable aside from mild facet arthrosis. IMPRESSION: 1. No acute fracture or static subluxation of the lumbar spine. 2. Severe spinal canal stenosis at L4-5 due to combination of disc bulge, facet arthrosis and grade 1 anterolisthesis. 3. Incompletely visualized left renal contour abnormality that could be a solid mass. Recommend further evaluation with renal ultrasound or CT abdomen pelvis with contrast. Aortic Atherosclerosis (ICD10-I70.0). Electronically Signed   By: Deatra Robinson M.D.   On: 04/06/2023 19:22   DG Chest Portable 1 View  Result Date: 04/06/2023 CLINICAL DATA:  Fall. Woke up on floor beside couch. Unknown down time. EXAM: PORTABLE CHEST 1 VIEW COMPARISON:  Chest radiographs 03/25/2023, 03/18/2023, 08/18/2022, 06/17/2022 FINDINGS: Cardiac silhouette and mediastinal contours are within limits. Mild calcification within the aortic arch. There are right-greater-than-left linear basilar densities that appear not significantly changed from 06/17/2022. Similar densities are seen on subsequent radiographs, suggesting chronic scarring. No definite acute airspace opacity. No pleural effusion or pneumothorax. Moderate multilevel degenerative disc changes of the thoracic spine. IMPRESSION: 1. No acute cardiopulmonary process. 2. Right-greater-than-left linear basilar densities that appear not significantly changed from 06/17/2022. Similar densities are seen on subsequent radiographs, suggesting chronic scarring. Electronically Signed   By: Neita Garnet M.D.   On: 04/06/2023  16:44   DG CHEST PORT 1 VIEW  Result Date: 03/25/2023 CLINICAL DATA:  Leukocytosis EXAM: PORTABLE CHEST 1 VIEW COMPARISON:  CXR 03/18/23 FINDINGS: Pleural effusion. No pneumothorax. Normal cardiac and mediastinal contours. Hazy bibasilar airspace opacities could represent atelectasis infection. Visualized upper abdomen is unremarkable. No radiographically apparent displaced rib fractures. IMPRESSION: Hazy bibasilar airspace opacities could represent atelectasis or infection. Electronically Signed   By: Lorenza Cambridge M.D.   On: 03/25/2023 12:35   DG Swallowing Func-Speech Pathology  Result Date: 03/19/2023 Table formatting from the original result was not included. Modified Barium Swallow Study Patient Details Name: UZZIEL RIEDINGER MRN: [id number] Date of Birth: [birth year] Today's Date: 03/19/2023 HPI/PMH: HPI: Dontre "Bill" EVREN ABRAHA is a 77 yo male presenting to ED with AMS, productive cough, and body aches. Found to be hypoxic, which was improved with placement of 4L [place]. CXR with bibasilar airspace opacities, R>L, which may be reflective of atelectasis or PNA. 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 Pt's oral phase is characterized by R anterior spillage and moderate residue, which pt has difficulty clearing with  PATIENT DETAILS Name: Tanner [name] Age: 77 y.o. Sex: male Date of Birth: 04/08/1946 MRN: [id number]. Admitting Physician: Angie Fava, DO GYI:[id number], [facility]  Admit Date: 04/06/2023 Discharge date: 04/14/2023  Recommendations for Outpatient Follow-up:  Follow up with PCP in 1-2 weeks Daptomycin until 12/4-then transition to doxycycline 100 mg p.o. twice daily for chronic suppression Follow-up with infectious disease as outpatient Ensure follow-up with orthopedics as outpatient Send weekly labs to infectious disease office-see below.  Admitted From:  Home  Disposition: Skilled nursing facility   Discharge Condition: good  CODE STATUS:   Code Status: Full Code   Diet recommendation:  Diet Order             Diet - low sodium heart healthy           Diet general           Diet heart healthy/carb modified Fluid consistency: Thin  Diet effective now                    Brief Summary: Patient is a 77 y.o.  male chronic HFrEF, DM-2, HTN, PAF on Eliquis-sent right knee arthroplasty 12/15/2022-presented with generalized weakness/ground-level fall-along with right knee discomfort for the past several days-upon further evaluation-he was found to have AKI with polymicrobial bacteremia-likely secondary to periprosthetic right knee infection (gram-negative rod on synovial fluid culture)   Significant events: 10/22>> admit to [facility]-I&D of right knee abscess by ED physician.   Significant studies: 3/6>> echo: EF 45-50% 10/23>> CXR: No PNA 10/23>> CT head: No acute intracranial abnormality 10/23>> CT C-spine: No fracture 10/23>> CT lumbar spine: No fracture 10/23>> x-ray right knee: Right knee arthroplasty without complication-joint effusion with subcutaneous edema anteriorly. 10/23>> renal ultrasound: No hydronephrosis. 1023>> echocardiogram: EF 60-65%, no obvious vegetations identified. 10/28>> TEE: No vegetations   Significant microbiology  data: 10/22>> blood culture: MRSA/Enterococcus faecalis 10/22>> superficial knee culture: MRSA 10/23>> intraoperative right knee culture: MRSA 10/24>> blood culture: Negative   Procedures: 10/22>> superficial right knee abscess I&D by ED physician 10/23>> excisional/nonexcisional debridement of right knee, polyethylene revision-Dr. Charlann Boxer. 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 Please fax weekly labs to infectious disease clinic. Please ensure follow-up with ID/orthopedics   Mechanical fall/generalized weakness Likely due to debility in the setting of AKI/bacteremia Imaging negative for fractures PT/OT eval-plan is for SNF   Acute on chronic HFrEF Volume status stable Continue Demadex to 20 mg daily.   PAF Continue/Coreg   Normocytic anemia Due to acute illness/AKI-no indication of GI loss-s/p 1 unit of PRBC on 10/26-Hb now stable and improved Follow CBC periodically   DM-2 CBG stable Resume Jardiance on discharge Infection issues are now stable.  HTN BP relatively stable-continue Coreg   BPH Finasteride   Recent history of left leg laceration Sutured 10/18-during an ED visit Discussed with general surgical team-picture reviewed-sutures removed 10/29.  Okay to  remove sutures today.   Obesity:  right ventricular size is normal.  3. No left atrial/left atrial appendage thrombus was detected.  4. The mitral valve is normal in structure. Trivial mitral valve regurgitation.  5. The aortic valve is tricuspid. Aortic valve regurgitation is not  visualized.  6. There is mild (Grade II) plaque involving the aortic arch. Conclusion(s)/Recommendation(s): No evidence of vegetation/infective endocarditis on this transesophageael echocardiogram. FINDINGS  Left Ventricle: Left ventricular ejection fraction, by estimation, is 60 to 65%. The left ventricle has normal function. The left ventricular internal cavity size was normal in size. Right Ventricle: The right ventricular size is normal. No increase in right ventricular wall thickness. Right ventricular systolic function is normal. Left Atrium: Left atrial size was normal in size. No left atrial/left atrial appendage thrombus was detected. Right Atrium: Right atrial size was normal in size. Pericardium: There is no evidence of pericardial effusion. Mitral Valve: The mitral valve is normal in structure. Trivial mitral valve regurgitation. Tricuspid Valve: The tricuspid valve is normal in structure. Tricuspid valve regurgitation is trivial. Aortic Valve: The aortic valve is tricuspid. Aortic valve regurgitation is not visualized. Pulmonic Valve: The pulmonic valve was normal in structure. Pulmonic valve regurgitation is not visualized. Aorta: The aortic root and ascending aorta are structurally normal, with no evidence of dilitation. There is mild (Grade II) plaque involving the aortic arch. IAS/Shunts: No atrial level shunt detected by color flow Doppler. Additional Comments: Spectral Doppler performed.  AORTA Ao Asc diam: 2.90 cm Zoila Shutter MD Electronically signed by Zoila Shutter MD Signature Date/Time: 04/12/2023/6:20:11 PM    Final    EP STUDY  Result Date: 04/12/2023 See surgical note for result.  [name] EKG SITE RITE  Result Date: 04/12/2023 If Site Rite image not attached, placement could not be confirmed due to current cardiac rhythm.  US RENAL  Result Date: 04/08/2023 CLINICAL DATA:  Acute renal injury EXAM: RENAL / URINARY TRACT ULTRASOUND COMPLETE COMPARISON:  CT from 12/19/2013  FINDINGS: Right Kidney: Renal measurements: 9.0 x 4.0 x 4.5 cm. = volume: 83.4 mL. Renal cysts are noted in the lower pole of the right kidney measuring up to 3.8 cm. These are simple in nature. No follow-up is recommended. Left Kidney: Renal measurements: 10.6 x 4.3 x 5.2 cm. = volume: 125.2 mL. Renal cysts are noted in the lower pole of the left kidney. The largest of these measures 6.8 cm. These are also simple in nature. No further follow-up is recommended. Bladder: Appears normal for degree of bladder distention. Other: None. IMPRESSION: Bilateral simple cysts.  No other focal abnormality is noted. Electronically Signed   By: Alcide Clever M.D.   On: 04/08/2023 01:36   ECHOCARDIOGRAM COMPLETE  Result Date: 04/07/2023    ECHOCARDIOGRAM REPORT   Patient Name:   Tanner [name] Date of Exam: 04/07/2023 Medical Rec #:  [id number]      Height:       64.0 in Accession #:    [id number]     Weight:       222.2 lb Date of Birth:  1945/12/08      BSA:          2.046 m Patient Age:    77 years       BP:           157/125 mmHg Patient Gender: M              HR:           73 bpm. Exam Location:  Inpatient Procedure: 2D  PATIENT DETAILS Name: Tanner [name] Age: 77 y.o. Sex: male Date of Birth: 04/08/1946 MRN: [id number]. Admitting Physician: Angie Fava, DO GYI:[id number], [facility]  Admit Date: 04/06/2023 Discharge date: 04/14/2023  Recommendations for Outpatient Follow-up:  Follow up with PCP in 1-2 weeks Daptomycin until 12/4-then transition to doxycycline 100 mg p.o. twice daily for chronic suppression Follow-up with infectious disease as outpatient Ensure follow-up with orthopedics as outpatient Send weekly labs to infectious disease office-see below.  Admitted From:  Home  Disposition: Skilled nursing facility   Discharge Condition: good  CODE STATUS:   Code Status: Full Code   Diet recommendation:  Diet Order             Diet - low sodium heart healthy           Diet general           Diet heart healthy/carb modified Fluid consistency: Thin  Diet effective now                    Brief Summary: Patient is a 77 y.o.  male chronic HFrEF, DM-2, HTN, PAF on Eliquis-sent right knee arthroplasty 12/15/2022-presented with generalized weakness/ground-level fall-along with right knee discomfort for the past several days-upon further evaluation-he was found to have AKI with polymicrobial bacteremia-likely secondary to periprosthetic right knee infection (gram-negative rod on synovial fluid culture)   Significant events: 10/22>> admit to [facility]-I&D of right knee abscess by ED physician.   Significant studies: 3/6>> echo: EF 45-50% 10/23>> CXR: No PNA 10/23>> CT head: No acute intracranial abnormality 10/23>> CT C-spine: No fracture 10/23>> CT lumbar spine: No fracture 10/23>> x-ray right knee: Right knee arthroplasty without complication-joint effusion with subcutaneous edema anteriorly. 10/23>> renal ultrasound: No hydronephrosis. 1023>> echocardiogram: EF 60-65%, no obvious vegetations identified. 10/28>> TEE: No vegetations   Significant microbiology  data: 10/22>> blood culture: MRSA/Enterococcus faecalis 10/22>> superficial knee culture: MRSA 10/23>> intraoperative right knee culture: MRSA 10/24>> blood culture: Negative   Procedures: 10/22>> superficial right knee abscess I&D by ED physician 10/23>> excisional/nonexcisional debridement of right knee, polyethylene revision-Dr. Charlann Boxer. 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 Please fax weekly labs to infectious disease clinic. Please ensure follow-up with ID/orthopedics   Mechanical fall/generalized weakness Likely due to debility in the setting of AKI/bacteremia Imaging negative for fractures PT/OT eval-plan is for SNF   Acute on chronic HFrEF Volume status stable Continue Demadex to 20 mg daily.   PAF Continue/Coreg   Normocytic anemia Due to acute illness/AKI-no indication of GI loss-s/p 1 unit of PRBC on 10/26-Hb now stable and improved Follow CBC periodically   DM-2 CBG stable Resume Jardiance on discharge Infection issues are now stable.  HTN BP relatively stable-continue Coreg   BPH Finasteride   Recent history of left leg laceration Sutured 10/18-during an ED visit Discussed with general surgical team-picture reviewed-sutures removed 10/29.  Okay to  remove sutures today.   Obesity:  PATIENT DETAILS Name: Tanner [name] Age: 77 y.o. Sex: male Date of Birth: 04/08/1946 MRN: [id number]. Admitting Physician: Angie Fava, DO GYI:[id number], [facility]  Admit Date: 04/06/2023 Discharge date: 04/14/2023  Recommendations for Outpatient Follow-up:  Follow up with PCP in 1-2 weeks Daptomycin until 12/4-then transition to doxycycline 100 mg p.o. twice daily for chronic suppression Follow-up with infectious disease as outpatient Ensure follow-up with orthopedics as outpatient Send weekly labs to infectious disease office-see below.  Admitted From:  Home  Disposition: Skilled nursing facility   Discharge Condition: good  CODE STATUS:   Code Status: Full Code   Diet recommendation:  Diet Order             Diet - low sodium heart healthy           Diet general           Diet heart healthy/carb modified Fluid consistency: Thin  Diet effective now                    Brief Summary: Patient is a 77 y.o.  male chronic HFrEF, DM-2, HTN, PAF on Eliquis-sent right knee arthroplasty 12/15/2022-presented with generalized weakness/ground-level fall-along with right knee discomfort for the past several days-upon further evaluation-he was found to have AKI with polymicrobial bacteremia-likely secondary to periprosthetic right knee infection (gram-negative rod on synovial fluid culture)   Significant events: 10/22>> admit to [facility]-I&D of right knee abscess by ED physician.   Significant studies: 3/6>> echo: EF 45-50% 10/23>> CXR: No PNA 10/23>> CT head: No acute intracranial abnormality 10/23>> CT C-spine: No fracture 10/23>> CT lumbar spine: No fracture 10/23>> x-ray right knee: Right knee arthroplasty without complication-joint effusion with subcutaneous edema anteriorly. 10/23>> renal ultrasound: No hydronephrosis. 1023>> echocardiogram: EF 60-65%, no obvious vegetations identified. 10/28>> TEE: No vegetations   Significant microbiology  data: 10/22>> blood culture: MRSA/Enterococcus faecalis 10/22>> superficial knee culture: MRSA 10/23>> intraoperative right knee culture: MRSA 10/24>> blood culture: Negative   Procedures: 10/22>> superficial right knee abscess I&D by ED physician 10/23>> excisional/nonexcisional debridement of right knee, polyethylene revision-Dr. Charlann Boxer. 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 Please fax weekly labs to infectious disease clinic. Please ensure follow-up with ID/orthopedics   Mechanical fall/generalized weakness Likely due to debility in the setting of AKI/bacteremia Imaging negative for fractures PT/OT eval-plan is for SNF   Acute on chronic HFrEF Volume status stable Continue Demadex to 20 mg daily.   PAF Continue/Coreg   Normocytic anemia Due to acute illness/AKI-no indication of GI loss-s/p 1 unit of PRBC on 10/26-Hb now stable and improved Follow CBC periodically   DM-2 CBG stable Resume Jardiance on discharge Infection issues are now stable.  HTN BP relatively stable-continue Coreg   BPH Finasteride   Recent history of left leg laceration Sutured 10/18-during an ED visit Discussed with general surgical team-picture reviewed-sutures removed 10/29.  Okay to  remove sutures today.   Obesity:  PATIENT DETAILS Name: Tanner [name] Age: 77 y.o. Sex: male Date of Birth: 04/08/1946 MRN: [id number]. Admitting Physician: Angie Fava, DO GYI:[id number], [facility]  Admit Date: 04/06/2023 Discharge date: 04/14/2023  Recommendations for Outpatient Follow-up:  Follow up with PCP in 1-2 weeks Daptomycin until 12/4-then transition to doxycycline 100 mg p.o. twice daily for chronic suppression Follow-up with infectious disease as outpatient Ensure follow-up with orthopedics as outpatient Send weekly labs to infectious disease office-see below.  Admitted From:  Home  Disposition: Skilled nursing facility   Discharge Condition: good  CODE STATUS:   Code Status: Full Code   Diet recommendation:  Diet Order             Diet - low sodium heart healthy           Diet general           Diet heart healthy/carb modified Fluid consistency: Thin  Diet effective now                    Brief Summary: Patient is a 77 y.o.  male chronic HFrEF, DM-2, HTN, PAF on Eliquis-sent right knee arthroplasty 12/15/2022-presented with generalized weakness/ground-level fall-along with right knee discomfort for the past several days-upon further evaluation-he was found to have AKI with polymicrobial bacteremia-likely secondary to periprosthetic right knee infection (gram-negative rod on synovial fluid culture)   Significant events: 10/22>> admit to [facility]-I&D of right knee abscess by ED physician.   Significant studies: 3/6>> echo: EF 45-50% 10/23>> CXR: No PNA 10/23>> CT head: No acute intracranial abnormality 10/23>> CT C-spine: No fracture 10/23>> CT lumbar spine: No fracture 10/23>> x-ray right knee: Right knee arthroplasty without complication-joint effusion with subcutaneous edema anteriorly. 10/23>> renal ultrasound: No hydronephrosis. 1023>> echocardiogram: EF 60-65%, no obvious vegetations identified. 10/28>> TEE: No vegetations   Significant microbiology  data: 10/22>> blood culture: MRSA/Enterococcus faecalis 10/22>> superficial knee culture: MRSA 10/23>> intraoperative right knee culture: MRSA 10/24>> blood culture: Negative   Procedures: 10/22>> superficial right knee abscess I&D by ED physician 10/23>> excisional/nonexcisional debridement of right knee, polyethylene revision-Dr. Charlann Boxer. 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 Please fax weekly labs to infectious disease clinic. Please ensure follow-up with ID/orthopedics   Mechanical fall/generalized weakness Likely due to debility in the setting of AKI/bacteremia Imaging negative for fractures PT/OT eval-plan is for SNF   Acute on chronic HFrEF Volume status stable Continue Demadex to 20 mg daily.   PAF Continue/Coreg   Normocytic anemia Due to acute illness/AKI-no indication of GI loss-s/p 1 unit of PRBC on 10/26-Hb now stable and improved Follow CBC periodically   DM-2 CBG stable Resume Jardiance on discharge Infection issues are now stable.  HTN BP relatively stable-continue Coreg   BPH Finasteride   Recent history of left leg laceration Sutured 10/18-during an ED visit Discussed with general surgical team-picture reviewed-sutures removed 10/29.  Okay to  remove sutures today.   Obesity:  right ventricular size is normal.  3. No left atrial/left atrial appendage thrombus was detected.  4. The mitral valve is normal in structure. Trivial mitral valve regurgitation.  5. The aortic valve is tricuspid. Aortic valve regurgitation is not  visualized.  6. There is mild (Grade II) plaque involving the aortic arch. Conclusion(s)/Recommendation(s): No evidence of vegetation/infective endocarditis on this transesophageael echocardiogram. FINDINGS  Left Ventricle: Left ventricular ejection fraction, by estimation, is 60 to 65%. The left ventricle has normal function. The left ventricular internal cavity size was normal in size. Right Ventricle: The right ventricular size is normal. No increase in right ventricular wall thickness. Right ventricular systolic function is normal. Left Atrium: Left atrial size was normal in size. No left atrial/left atrial appendage thrombus was detected. Right Atrium: Right atrial size was normal in size. Pericardium: There is no evidence of pericardial effusion. Mitral Valve: The mitral valve is normal in structure. Trivial mitral valve regurgitation. Tricuspid Valve: The tricuspid valve is normal in structure. Tricuspid valve regurgitation is trivial. Aortic Valve: The aortic valve is tricuspid. Aortic valve regurgitation is not visualized. Pulmonic Valve: The pulmonic valve was normal in structure. Pulmonic valve regurgitation is not visualized. Aorta: The aortic root and ascending aorta are structurally normal, with no evidence of dilitation. There is mild (Grade II) plaque involving the aortic arch. IAS/Shunts: No atrial level shunt detected by color flow Doppler. Additional Comments: Spectral Doppler performed.  AORTA Ao Asc diam: 2.90 cm Zoila Shutter MD Electronically signed by Zoila Shutter MD Signature Date/Time: 04/12/2023/6:20:11 PM    Final    EP STUDY  Result Date: 04/12/2023 See surgical note for result.  [name] EKG SITE RITE  Result Date: 04/12/2023 If Site Rite image not attached, placement could not be confirmed due to current cardiac rhythm.  US RENAL  Result Date: 04/08/2023 CLINICAL DATA:  Acute renal injury EXAM: RENAL / URINARY TRACT ULTRASOUND COMPLETE COMPARISON:  CT from 12/19/2013  FINDINGS: Right Kidney: Renal measurements: 9.0 x 4.0 x 4.5 cm. = volume: 83.4 mL. Renal cysts are noted in the lower pole of the right kidney measuring up to 3.8 cm. These are simple in nature. No follow-up is recommended. Left Kidney: Renal measurements: 10.6 x 4.3 x 5.2 cm. = volume: 125.2 mL. Renal cysts are noted in the lower pole of the left kidney. The largest of these measures 6.8 cm. These are also simple in nature. No further follow-up is recommended. Bladder: Appears normal for degree of bladder distention. Other: None. IMPRESSION: Bilateral simple cysts.  No other focal abnormality is noted. Electronically Signed   By: Alcide Clever M.D.   On: 04/08/2023 01:36   ECHOCARDIOGRAM COMPLETE  Result Date: 04/07/2023    ECHOCARDIOGRAM REPORT   Patient Name:   Tanner [name] Date of Exam: 04/07/2023 Medical Rec #:  [id number]      Height:       64.0 in Accession #:    [id number]     Weight:       222.2 lb Date of Birth:  1945/12/08      BSA:          2.046 m Patient Age:    77 years       BP:           157/125 mmHg Patient Gender: M              HR:           73 bpm. Exam Location:  Inpatient Procedure: 2D  exam was performed according to the departmental dose-optimization program which includes automated exposure control, adjustment of the mA and/or kV according to patient size and/or use of iterative reconstruction technique. COMPARISON:  CT abdomen pelvis 12/19/2013 FINDINGS: Segmentation: 5 lumbar type vertebrae. Alignment: Right convex scoliosis with apex at L2. Grade 1 anterolisthesis at L4-5. Vertebrae: Multilevel degenerative endplate remodeling throughout the lumbar spine, but greatest at L1-2 and L2-3. No acute fracture. No evidence of discitis-osteomyelitis. Paraspinal and other soft tissues: Enlarged prostate gland. Calcific aortic atherosclerosis. There is an incompletely visualized left renal contour abnormality that could be a  solid mass. Disc levels: L1-2: Disc bulge and facet arthrosis with mild spinal canal stenosis. Mild left foraminal stenosis. L2-3: Disc bulge and facet arthrosis with moderate spinal canal stenosis. L3-4: Disc bulge and facet arthrosis with mild spinal canal stenosis. Mild left foraminal stenosis. L4-5: Severe facet arthrosis with disc bulge and anterolisthesis. Severe spinal canal stenosis. Mild right foraminal stenosis. L5-S1: Unremarkable aside from mild facet arthrosis. IMPRESSION: 1. No acute fracture or static subluxation of the lumbar spine. 2. Severe spinal canal stenosis at L4-5 due to combination of disc bulge, facet arthrosis and grade 1 anterolisthesis. 3. Incompletely visualized left renal contour abnormality that could be a solid mass. Recommend further evaluation with renal ultrasound or CT abdomen pelvis with contrast. Aortic Atherosclerosis (ICD10-I70.0). Electronically Signed   By: Deatra Robinson M.D.   On: 04/06/2023 19:22   DG Chest Portable 1 View  Result Date: 04/06/2023 CLINICAL DATA:  Fall. Woke up on floor beside couch. Unknown down time. EXAM: PORTABLE CHEST 1 VIEW COMPARISON:  Chest radiographs 03/25/2023, 03/18/2023, 08/18/2022, 06/17/2022 FINDINGS: Cardiac silhouette and mediastinal contours are within limits. Mild calcification within the aortic arch. There are right-greater-than-left linear basilar densities that appear not significantly changed from 06/17/2022. Similar densities are seen on subsequent radiographs, suggesting chronic scarring. No definite acute airspace opacity. No pleural effusion or pneumothorax. Moderate multilevel degenerative disc changes of the thoracic spine. IMPRESSION: 1. No acute cardiopulmonary process. 2. Right-greater-than-left linear basilar densities that appear not significantly changed from 06/17/2022. Similar densities are seen on subsequent radiographs, suggesting chronic scarring. Electronically Signed   By: Neita Garnet M.D.   On: 04/06/2023  16:44   DG CHEST PORT 1 VIEW  Result Date: 03/25/2023 CLINICAL DATA:  Leukocytosis EXAM: PORTABLE CHEST 1 VIEW COMPARISON:  CXR 03/18/23 FINDINGS: Pleural effusion. No pneumothorax. Normal cardiac and mediastinal contours. Hazy bibasilar airspace opacities could represent atelectasis infection. Visualized upper abdomen is unremarkable. No radiographically apparent displaced rib fractures. IMPRESSION: Hazy bibasilar airspace opacities could represent atelectasis or infection. Electronically Signed   By: Lorenza Cambridge M.D.   On: 03/25/2023 12:35   DG Swallowing Func-Speech Pathology  Result Date: 03/19/2023 Table formatting from the original result was not included. Modified Barium Swallow Study Patient Details Name: UZZIEL RIEDINGER MRN: [id number] Date of Birth: [birth year] Today's Date: 03/19/2023 HPI/PMH: HPI: Dontre "Bill" EVREN ABRAHA is a 77 yo male presenting to ED with AMS, productive cough, and body aches. Found to be hypoxic, which was improved with placement of 4L [place]. CXR with bibasilar airspace opacities, R>L, which may be reflective of atelectasis or PNA. 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 Pt's oral phase is characterized by R anterior spillage and moderate residue, which pt has difficulty clearing with  PATIENT DETAILS Name: Tanner [name] Age: 77 y.o. Sex: male Date of Birth: 04/08/1946 MRN: [id number]. Admitting Physician: Angie Fava, DO GYI:[id number], Veterans  Admit Date: 04/06/2023 Discharge date: 04/14/2023  Recommendations for Outpatient Follow-up:  Follow up with PCP in 1-2 weeks Daptomycin until 12/4-then transition to doxycycline 100 mg p.o. twice daily for chronic suppression Follow-up with infectious disease as outpatient Ensure follow-up with orthopedics as outpatient Send weekly labs to infectious disease office-see below.  Admitted From:  Home  Disposition: Skilled nursing facility   Discharge Condition: good  CODE STATUS:   Code Status: Full Code   Diet recommendation:  Diet Order             Diet - low sodium heart healthy           Diet general           Diet heart healthy/carb modified Fluid consistency: Thin  Diet effective now                    Brief Summary: Patient is a 77 y.o.  male chronic HFrEF, DM-2, HTN, PAF on Eliquis-sent right knee arthroplasty 12/15/2022-presented with generalized weakness/ground-level fall-along with right knee discomfort for the past several days-upon further evaluation-he was found to have AKI with polymicrobial bacteremia-likely secondary to periprosthetic right knee infection (gram-negative rod on synovial fluid culture)   Significant events: 10/22>> admit to [facility]-I&D of right knee abscess by ED physician.   Significant studies: 3/6>> echo: EF 45-50% 10/23>> CXR: No PNA 10/23>> CT head: No acute intracranial abnormality 10/23>> CT C-spine: No fracture 10/23>> CT lumbar spine: No fracture 10/23>> x-ray right knee: Right knee arthroplasty without complication-joint effusion with subcutaneous edema anteriorly. 10/23>> renal ultrasound: No hydronephrosis. 1023>> echocardiogram: EF 60-65%, no obvious vegetations identified. 10/28>> TEE: No vegetations   Significant microbiology  data: 10/22>> blood culture: MRSA/Enterococcus faecalis 10/22>> superficial knee culture: MRSA 10/23>> intraoperative right knee culture: MRSA 10/24>> blood culture: Negative   Procedures: 10/22>> superficial right knee abscess I&D by ED physician 10/23>> excisional/nonexcisional debridement of right knee, polyethylene revision-Dr. Charlann Boxer. 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 Please fax weekly labs to infectious disease clinic. Please ensure follow-up with ID/orthopedics   Mechanical fall/generalized weakness Likely due to debility in the setting of AKI/bacteremia Imaging negative for fractures PT/OT eval-plan is for SNF   Acute on chronic HFrEF Volume status stable Continue Demadex to 20 mg daily.   PAF Continue/Coreg   Normocytic anemia Due to acute illness/AKI-no indication of GI loss-s/p 1 unit of PRBC on 10/26-Hb now stable and improved Follow CBC periodically   DM-2 CBG stable Resume Jardiance on discharge Infection issues are now stable.  HTN BP relatively stable-continue Coreg   BPH Finasteride   Recent history of left leg laceration Sutured 10/18-during an ED visit Discussed with general surgical team-picture reviewed-sutures removed 10/29.  Okay to  remove sutures today.   Obesity:  Date: 03/18/2023  Lower Venous DVT Study Patient Name:   FREDRIK HOLDERBY  Date of Exam:   03/18/2023 Medical Rec #: [id number]       Accession #:    [id number] Date of Birth: 04-04-1946       Patient Gender: M Patient Age:   27 years Exam Location:  [facility] Procedure:      VAS [name] LOWER EXTREMITY VENOUS (DVT) Referring Phys: Dow Adolph --------------------------------------------------------------------------------  Indications: Edema. Status post fall on right knee, knee effusion by X-ray. Right TKR 12/15/22  Comparison Study: Prior negative Right LEV done 01/07/22 Performing Technologist: Sherren Kerns RVS  Examination Guidelines: A complete evaluation includes B-mode imaging, spectral Doppler, color Doppler, and power Doppler as needed of all accessible portions of each vessel. Bilateral testing is considered an integral part of a complete examination. Limited examinations for reoccurring indications may be performed as noted. The reflux portion of the exam is performed with the patient in reverse Trendelenburg.  +--------+---------------+---------+-----------+----------------+-------------+ RIGHT   CompressibilityPhasicitySpontaneityProperties      Thrombus                                                                 Aging         +--------+---------------+---------+-----------+----------------+-------------+ CFV     Full                               pulsatile                                                                waveforms                     +--------+---------------+---------+-----------+----------------+-------------+ SFJ     Full                                                             +--------+---------------+---------+-----------+----------------+-------------+ FV Prox Full                               pulsatile                                                                waveforms                     +--------+---------------+---------+-----------+----------------+-------------+ FV Mid   Full                               pulsatile  exam was performed according to the departmental dose-optimization program which includes automated exposure control, adjustment of the mA and/or kV according to patient size and/or use of iterative reconstruction technique. COMPARISON:  CT abdomen pelvis 12/19/2013 FINDINGS: Segmentation: 5 lumbar type vertebrae. Alignment: Right convex scoliosis with apex at L2. Grade 1 anterolisthesis at L4-5. Vertebrae: Multilevel degenerative endplate remodeling throughout the lumbar spine, but greatest at L1-2 and L2-3. No acute fracture. No evidence of discitis-osteomyelitis. Paraspinal and other soft tissues: Enlarged prostate gland. Calcific aortic atherosclerosis. There is an incompletely visualized left renal contour abnormality that could be a  solid mass. Disc levels: L1-2: Disc bulge and facet arthrosis with mild spinal canal stenosis. Mild left foraminal stenosis. L2-3: Disc bulge and facet arthrosis with moderate spinal canal stenosis. L3-4: Disc bulge and facet arthrosis with mild spinal canal stenosis. Mild left foraminal stenosis. L4-5: Severe facet arthrosis with disc bulge and anterolisthesis. Severe spinal canal stenosis. Mild right foraminal stenosis. L5-S1: Unremarkable aside from mild facet arthrosis. IMPRESSION: 1. No acute fracture or static subluxation of the lumbar spine. 2. Severe spinal canal stenosis at L4-5 due to combination of disc bulge, facet arthrosis and grade 1 anterolisthesis. 3. Incompletely visualized left renal contour abnormality that could be a solid mass. Recommend further evaluation with renal ultrasound or CT abdomen pelvis with contrast. Aortic Atherosclerosis (ICD10-I70.0). Electronically Signed   By: Deatra Robinson M.D.   On: 04/06/2023 19:22   DG Chest Portable 1 View  Result Date: 04/06/2023 CLINICAL DATA:  Fall. Woke up on floor beside couch. Unknown down time. EXAM: PORTABLE CHEST 1 VIEW COMPARISON:  Chest radiographs 03/25/2023, 03/18/2023, 08/18/2022, 06/17/2022 FINDINGS: Cardiac silhouette and mediastinal contours are within limits. Mild calcification within the aortic arch. There are right-greater-than-left linear basilar densities that appear not significantly changed from 06/17/2022. Similar densities are seen on subsequent radiographs, suggesting chronic scarring. No definite acute airspace opacity. No pleural effusion or pneumothorax. Moderate multilevel degenerative disc changes of the thoracic spine. IMPRESSION: 1. No acute cardiopulmonary process. 2. Right-greater-than-left linear basilar densities that appear not significantly changed from 06/17/2022. Similar densities are seen on subsequent radiographs, suggesting chronic scarring. Electronically Signed   By: Neita Garnet M.D.   On: 04/06/2023  16:44   DG CHEST PORT 1 VIEW  Result Date: 03/25/2023 CLINICAL DATA:  Leukocytosis EXAM: PORTABLE CHEST 1 VIEW COMPARISON:  CXR 03/18/23 FINDINGS: Pleural effusion. No pneumothorax. Normal cardiac and mediastinal contours. Hazy bibasilar airspace opacities could represent atelectasis infection. Visualized upper abdomen is unremarkable. No radiographically apparent displaced rib fractures. IMPRESSION: Hazy bibasilar airspace opacities could represent atelectasis or infection. Electronically Signed   By: Lorenza Cambridge M.D.   On: 03/25/2023 12:35   DG Swallowing Func-Speech Pathology  Result Date: 03/19/2023 Table formatting from the original result was not included. Modified Barium Swallow Study Patient Details Name: UZZIEL RIEDINGER MRN: [id number] Date of Birth: [birth year] Today's Date: 03/19/2023 HPI/PMH: HPI: Dontre "Bill" EVREN ABRAHA is a 77 yo male presenting to ED with AMS, productive cough, and body aches. Found to be hypoxic, which was improved with placement of 4L [place]. CXR with bibasilar airspace opacities, R>L, which may be reflective of atelectasis or PNA. 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 Pt's oral phase is characterized by R anterior spillage and moderate residue, which pt has difficulty clearing with  LEFT: - No evidence of common  femoral vein obstruction.   *See table(s) above for measurements and observations. Electronically signed by Carolynn Sayers on 03/18/2023 at 6:56:31 PM.    Final    CT Head Wo Contrast  Result Date: 03/18/2023 CLINICAL DATA:  Altered mental status EXAM: CT HEAD WITHOUT CONTRAST TECHNIQUE: Contiguous axial images were obtained from the base of the skull through the vertex without intravenous contrast. RADIATION DOSE REDUCTION: This exam was performed according to the departmental dose-optimization program which includes automated exposure control, adjustment of the mA and/or kV according to patient size and/or use of iterative reconstruction technique. COMPARISON:  05/04/2022 FINDINGS: Brain: There is atrophy and chronic small vessel disease changes. No acute intracranial abnormality. Specifically, no hemorrhage, hydrocephalus, mass lesion, acute infarction, or significant intracranial injury. Vascular: No hyperdense vessel or unexpected calcification. Skull: No acute calvarial abnormality. Sinuses/Orbits: No acute findings Other: None IMPRESSION: Atrophy, chronic microvascular disease. No acute intracranial abnormality. Electronically Signed   By: Charlett Nose M.D.   On: 03/18/2023 01:57   DG Chest Portable 1 View  Result Date: 03/18/2023 CLINICAL DATA:  Knee pain.  Altered her mental status. EXAM: PORTABLE CHEST 1 VIEW COMPARISON:  08/18/2022 FINDINGS: Heart and mediastinal contours are within normal limits. Bibasilar airspace opacities, right greater than left, no effusions. No acute bony abnormality. IMPRESSION: Bibasilar airspace opacities, right greater than left. This could reflect atelectasis or pneumonia. Electronically Signed   By: Charlett Nose M.D.   On: 03/18/2023 01:42   DG Knee Complete 4 Views Right  Result Date: 03/18/2023 CLINICAL DATA:  Knee pain EXAM: RIGHT KNEE - COMPLETE 4+ VIEW COMPARISON:  None Available. FINDINGS: Prior right knee replacement. No hardware complicating feature. Large  joint effusion. Anterior soft tissue swelling. No acute bony abnormality. Specifically, no fracture, subluxation, or dislocation. IMPRESSION: Right knee replacement. Large joint effusion. No acute bony abnormality. Electronically Signed   By: Charlett Nose M.D.   On: 03/18/2023 01:41     TODAY-DAY OF DISCHARGE:  Subjective:   Carleene Overlie today has no headache,no chest abdominal pain,no new weakness tingling or numbness, feels much better wants to go home today.   Objective:   Blood pressure (!) 164/65, pulse 70, temperature 98 F (36.7 C), temperature source Oral, resp. rate 17, height 5\' 4"  (1.626 m), weight 100 kg, SpO2 92%.  Intake/Output Summary (Last 24 hours) at 04/14/2023 0904 Last data filed at 04/13/2023 1842 Gross per 24 hour  Intake 99.24 ml  Output 800 ml  Net -700.76 ml   Filed Weights   04/06/23 1347 04/07/23 0418 04/08/23 0500  Weight: 104.3 kg 100.8 kg 100 kg    Exam: Awake Alert, Oriented *3, No new F.N deficits, Normal affect [place].AT,PERRAL Supple Neck,No JVD, No cervical lymphadenopathy appriciated.  Symmetrical Chest wall movement, Good air movement bilaterally, CTAB RRR,No Gallops,Rubs or new Murmurs, No Parasternal Heave +ve B.Sounds, Abd Soft, Non tender, No organomegaly appriciated, No rebound -guarding or rigidity. No Cyanosis, Clubbing or edema, No new Rash or bruise   PERTINENT RADIOLOGIC STUDIES: ECHO TEE  Result Date: 04/12/2023    TRANSESOPHOGEAL ECHO REPORT   Patient Name:   Tanner [name] Date of Exam: 04/12/2023 Medical Rec #:  [id number]      Height:       64.0 in Accession #:    [id number]     Weight:       220.5 lb Date of Birth:  05-07-1946      BSA:

## 2023-04-14 NOTE — Care Management Important Message (Signed)
Important Message  Patient Details  Name: Tanner [name] MRN: [id number] Date of Birth: 02-04-1946   Important Message Given:  Yes - Medicare IM     Dorena Bodo 04/14/2023, 3:07 PM

## 2023-04-14 NOTE — TOC Transition Note (Signed)
Transition of Care [facility]) - CM/SW Discharge Note   Patient Details  Name: Tanner [name] MRN: [id number] Date of Birth: 01/21/1946  [facility] CM/SW Contact:  Michaela Corner, LCSWA Phone Number: 04/14/2023, 12:30 PM   Clinical Narrative:    Patient will DC to: [facility] Anticipated DC date: 04/14/2023 Family notified: Son - Luisa Hart  Transport by: Sharin Mons   Per MD patient ready for DC to [facility] room 605 . RN to call report prior to discharge [phone]). RN, patient, patient's family, and facility notified of DC. Discharge Summary and FL2 sent to facility. DC packet on chart. Ambulance transport requested for patient.   CSW will sign off for now as social work intervention is no longer needed. Please consult [name] again if new needs arise.      Final next level of care: Skilled Nursing Facility Barriers to Discharge: Barriers Resolved   Patient Goals and CMS Choice   Choice offered to / list presented to : Adult Children  Discharge Placement                Patient chooses bed at: [facility] Patient to be transferred to facility by: [facility] Name of family member notified: Son Luisa Hart Patient and family notified of of transfer: 04/14/23  Discharge Plan and Services Additional resources added to the After Visit Summary for   In-house Referral: Clinical Social Work                                   Social Determinants of Health (SDOH) Interventions SDOH Screenings   Food Insecurity: No Food Insecurity (04/07/2023)  Housing: Low Risk  (04/07/2023)  Transportation Needs: No Transportation Needs (04/07/2023)  Utilities: Not At Risk (04/07/2023)  Alcohol Screen: Low Risk  (10/27/2022)  Depression (PHQ2-9): Low Risk  (01/04/2023)  Financial Resource Strain: Low Risk  (10/27/2022)  Physical Activity: Inactive (10/27/2022)  Social Connections: Moderately Integrated (10/27/2022)  Stress: No Stress Concern Present (12/31/2022)  Recent  Concern: Stress - Stress Concern Present (10/30/2022)  Tobacco Use: Medium Risk (04/11/2023)     Readmission Risk Interventions    08/20/2022   11:10 AM  Readmission Risk Prevention Plan  Transportation Screening Complete  PCP or Specialist Appt within 3-5 Days Complete  HRI or Home Care Consult Complete  Palliative Care Screening Not Applicable  Medication Review (RN Care Manager) Complete

## 2023-04-14 NOTE — Consult Note (Signed)
[facility]  Consult   04/14/2023  [facility] MALTA 1946/01/21 [id number]  [facility]  Accountable Care Organization [ACO] Patient: Tanner [name]  Primary Care Provider:  Administration, Veterans  Insurance:  [employer] Medicare  Patient is extreme high risk with less than 30 days readmission and showing as currently active with [facility] RN Care Management for care coordination services.  Patient has been engaged by a [employer].  The community based plan of care has focused on disease management and community resource support.    Patient transition to SNF at [facility].  Plan: Alert Community RN CC and Community [facility] RN of patient at an affiliated facility.   Of note, [facility] Care Management services does not replace or interfere with any services that are needed or arranged by inpatient [facility] care management team.   Charlesetta Shanks, RN, BSN, CCM [place]  [facility], [facility] Liaison Direct Dial: [phone] or secure chat Website: [email or web address]

## 2023-04-14 NOTE — Plan of Care (Signed)
  Problem: Education: Goal: Ability to describe self-care measures that may prevent or decrease complications (Diabetes Survival Skills Education) will improve Outcome: Progressing Goal: Individualized Educational Video(s) Outcome: Progressing   Problem: Coping: Goal: Ability to adjust to condition or change in health will improve Outcome: Progressing   Problem: Fluid Volume: Goal: Ability to maintain a balanced intake and output will improve Outcome: Progressing   Problem: Health Behavior/Discharge Planning: Goal: Ability to identify and utilize available resources and services will improve Outcome: Progressing Goal: Ability to manage health-related needs will improve Outcome: Progressing   Problem: Metabolic: Goal: Ability to maintain appropriate glucose levels will improve Outcome: Progressing   Problem: Nutritional: Goal: Maintenance of adequate nutrition will improve Outcome: Progressing Goal: Progress toward achieving an optimal weight will improve Outcome: Progressing   Problem: Skin Integrity: Goal: Risk for impaired skin integrity will decrease Outcome: Progressing   Problem: Tissue Perfusion: Goal: Adequacy of tissue perfusion will improve Outcome: Progressing   Problem: Education: Goal: Knowledge of General Education information will improve Description: Including pain rating scale, medication(s)/side effects and non-pharmacologic comfort measures Outcome: Progressing   Problem: Health Behavior/Discharge Planning: Goal: Ability to manage health-related needs will improve Outcome: Progressing   Problem: Clinical Measurements: Goal: Ability to maintain clinical measurements within normal limits will improve Outcome: Progressing Goal: Will remain free from infection Outcome: Progressing Goal: Diagnostic test results will improve Outcome: Progressing Goal: Respiratory complications will improve Outcome: Progressing Goal: Cardiovascular complication will  be avoided Outcome: Progressing   Problem: Activity: Goal: Risk for activity intolerance will decrease Outcome: Progressing   Problem: Nutrition: Goal: Adequate nutrition will be maintained Outcome: Progressing   Problem: Coping: Goal: Level of anxiety will decrease Outcome: Progressing   Problem: Elimination: Goal: Will not experience complications related to bowel motility Outcome: Progressing Goal: Will not experience complications related to urinary retention Outcome: Progressing   Problem: Pain Management: Goal: General experience of comfort will improve Outcome: Progressing   Problem: Safety: Goal: Ability to remain free from injury will improve Outcome: Progressing   Problem: Skin Integrity: Goal: Risk for impaired skin integrity will decrease Outcome: Progressing   Problem: Education: Goal: Knowledge of the prescribed therapeutic regimen will improve Outcome: Progressing Goal: Individualized Educational Video(s) Outcome: Progressing   Problem: Activity: Goal: Ability to avoid complications of mobility impairment will improve Outcome: Progressing Goal: Range of joint motion will improve Outcome: Progressing   Problem: Clinical Measurements: Goal: Postoperative complications will be avoided or minimized Outcome: Progressing   Problem: Pain Management: Goal: Pain level will decrease with appropriate interventions Outcome: Progressing   Problem: Skin Integrity: Goal: Will show signs of wound healing Outcome: Progressing

## 2023-04-14 NOTE — Congregational Nurse Program (Unsigned)
[id number]/tcf-bill/at home with family support and [facility] for iv infusions.

## 2023-04-14 NOTE — Progress Notes (Signed)
1155: report attempted, phone number went right to nursing supervisor voicemail

## 2023-04-14 NOTE — Progress Notes (Signed)
Mobility Specialist Progress Note;   04/14/23 1020  Mobility  Activity Ambulated with assistance in room  Level of Assistance Minimal assist, patient does 75% or more  Assistive Device Front wheel walker  Distance Ambulated (ft) 20 ft  RLE Weight Bearing WBAT  Activity Response Tolerated well  Mobility Referral Yes  $Mobility charge 1 Mobility  Mobility Specialist Start Time (ACUTE ONLY) 1020  Mobility Specialist Stop Time (ACUTE ONLY) 1035  Mobility Specialist Time Calculation (min) (ACUTE ONLY) 15 min   Pt agreeable to mobility. Required minA for STS and minG during ambulation. No c/o knee pain during session. Pt back in bed with all needs met. Alarm on.   Caesar Bookman Mobility Specialist Please contact via SecureChat or Rehab Office [phone]

## 2023-04-14 NOTE — Nursing Note (Signed)
Behavioral Health Integration--Brief Note  BHI Enrollment Date Patient was enrolled in BHI on  .     PCP Rhoderick Moody, DO   Collaborating Psychiatrist: No care team member to display  Behavioral Health Case Manager: Tasia Catchings, MSW         MSW attempted to reach patient via telephone for initial assessments, no success   Total BHI time spent this encounter is   minutes.    Tasia Catchings, MSW  04/14/2023, 09:13  Behavioral Health Case Manager

## 2023-04-14 NOTE — Nursing Note (Signed)
POPULATION HEALTH  Behavioral Health Integration-Initial Assessment    Behavioral Health Integration --Psychiatric Collaborative Care --Initial Assessment  Behavioral Health Integration - BHI  Status: Enrolled  Effective Dates: 04/14/2023 - present  Responsible Staff: Tasia Catchings, MSW      Behavioral Health Integration Care Team:  RUE:AVWUJ Ossineke, DO  Collaborating Psychiatrist: Doristine Bosworth, MD    ASSESSMENT / PLAN:     Thomas [name] is a 77 year old male referred to BHI for seasonal depression. He is also working with [facility]. He reports he started to notice his seasonal depression 10-15 years ago. He tends to feel more anxious primary, that causes a low mood.   Bill engages with ease. He was born and raised in [place]. He was active in school playing football, track and wrestling. He is the eldest of 3 children. He described his childhood as "good". He reports his mother was "warm" and his father was not. He denies abuse, but reports his father was in charge of "spankings".   Patient went to college after high school, he was unable to finish due to financial stress. 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 He married at 62 and they had three daughters. His wife was an Charity fundraiser, she struggled in her 55's with alcoholism, but then found recovery in [place]. He reports the marriage was "rocky" during her active addiction.   Patient has experienced significant loss  In 2018 - his wife passed  In 2019 - his daughter passed  In 2020 - his nephew passed  In 2021 - his sister passed.    Patient remarried to his second wife vivian - they enjoyed traveling to see [place] and connected to spirituality together, she passed in 2023.    Patient has two daughters, living - one lives in [place] and does not visit often, the other lives in [place] and visits twice monthly.   Patient reports he find himself always needing a task and to "keep busy".   Patient  supports: daughter, church connection he is active in church weekly, golf league.   Sleep : patient reports he sleeps well, 6 hours per night, feeling rested.   Patient is open to therapy if recommended he would consider.  Patient has never been medicated for symptoms of depression or anxiety. He is not currently interested in medications, however MSW explained that recommendations can be discussed with PCP, and utilized or not at his level of comfort. Patient is agreeable.     Brief Interventions planned Emotional support and psychoeducation   Tasks To Do  My Tasks       Due Soon          Provide BHI Handout     Assigned to: Tasia Catchings.  Due: 04/14/2023.              Introduce Patient Case to [facility] Team for Initial Psych Consult     Assigned to: Tasia Catchings.  Due: 04/21/2023.              Identify Anxiety Symptoms and Facilitate Treatment     Assigned to: Tasia Catchings.  Due: 05/13/2023.  Priority: THREE.     Care Management Activities:      - anxiety screen reviewed  - depression screen reviewed      Notes:  Alleviate Barriers to Anxiety Treatment Success     Assigned to: Tasia Catchings.  Due: 05/13/2023.  Priority: THREE.     Care Management Activities:      - activity or exercise based on tolerance encouraged  - awareness of emotional and physical triggers encouraged  - barriers to care or meeting goals identified  - coping strategies encouraged      Notes:                                        Upcoming          BHI Psych Consult/Progression Review     Assigned to: Tasia Catchings.  Due: 05/14/2023.              BHI Monthly Validated Measures and Interventions     Assigned to: Tasia Catchings.  Due: 05/14/2023.              Monthly BHI Outreach     Assigned to: [employer].  Due: 05/17/2023.              BHI Monthly Billing     Assigned to: Tasia Catchings.  Due: 05/17/2023.              BHI Review Diagnosis     Assigned to: Tasia Catchings.   Due: 06/14/2023.              BHI Consider Relapse Prevention (Month 4)     Assigned to: [employer].  Due: 08/10/2023.              BHI Consider Relapse Prevention (Month 5)     Assigned to: [employer].  Due: 09/07/2023.              BHI Consider Relapse Prevention (Month 6)     Assigned to: [employer].  Due: 10/08/2023.              BHI Discuss Graduation with Patient (Month 7)     Assigned to: [employer].  Due: 11/09/2023.              BHI Discuss Graduation with Patient (Month 8)     Assigned to: [employer].  Due: 12/08/2023.              BHI Patient has been enrolled in BHI for 9 months. Discuss further plans with BHI Team.     Assigned to: Tasia Catchings.  Due: 01/07/2024.              BHI Patient has been enrolled in BHI for 12 months. Discuss further plans with BHI Team and graduation.     Assigned to: Tasia Catchings.  Due: 04/10/2024.                            Goals Addressed                   This Visit's Progress     Anxiety Symptoms Monitored and Managed        Evidence-based guidance:   Explore treatment options in a youth-friendly atmosphere of hope and optimism [e.g., cognitive behavioral therapy, pharmacologic therapy, adjunct therapy (applied relaxation, mindfulness, meditation)].   Encourage ?obooster? sessions of cognitive behavioral therapy to prevent relapse that may occur 6 to 24 months after treatment has concluded.   Prepare child and  caregiver for pharmacologic therapy (e.g., benzodiazepine) as short-term treatment to alleviate severe symptoms while allotting time for anxiolytic medication to become therapeutic, as well as allow for engagement    in treatment.   Prepare patient for short-term pharmacologic therapy when symptoms become debilitating (e.g., selective serotonin-reuptake inhibitor, serotonin-norepinephrine-reuptake inhibitor, tricyclic antidepressant).   Anticipate referral to behavioral health specialist or psychiatry when first-line  treatments are not effective, anxiety is complicated by alcohol or substance use, symptoms interfere with social functioning, secondary depression or    suicidality occurs.   Consider parent-based training when child participation in cognitive behavioral therapy is not possible or child is not responding to treatment.   Address barriers to treatment that may include long wait times, delays in scheduling appointments, lack of pediatric mental health providers, transportation, inadequate or no insurance or stigma associated with mental health    disorders and treatment.    Notes:             Total BHI time spent this encounter is 39 minutes.     Total BHI time spent this month is 45 minutes.    CONSENT:  Behavioral Health Integration services have been deemed to be appropriate by the patient's primary care provider for behavioral health condition(s): seasonal depression  Verbal consent obtained from the patient on the date of enrollment including permission to discuss personal health information and review their case with collaborating psychiatrist and case manager(s) in a weekly case conference.  Patient was informed about expected cost sharing, right to cancel the service and how to cancel the service.  The patient is provided with behavioral health case manager contact information and 24/7 nurse-on-call phone number.  SUBJECTIVE:      Past Behavioral Health History:  Previous Mental Health Treatment: counseling  Previous Mental Health Treatment Date: Micah Flesher to couples counseling years ago with first wife, this was a positive experience  Current Mental Health Treatment: none  Current Behavioral Health History:  Behavioral Health Care Management - Encounter Level:   Psychiatric Medication Assessment:       Social History     Socioeconomic History    Marital status: Widowed   Tobacco Use    Smoking status: Former     Types: Cigars    Smokeless tobacco: Current     Types: Chew, Snuff   Vaping Use    Vaping status: Never  Used   Substance and Sexual Activity    Alcohol use: Yes     Alcohol/week: 2.0 standard drinks of alcohol     Types: 2 Cans of beer per week     Comment: few days a week    Drug use: Never    Sexual activity: Not Currently     Social Determinants of Health     Financial Resource Strain: Low Risk  (03/24/2023)    Financial Resource Strain     SDOH Financial: No   Transportation Needs: Low Risk  (03/24/2023)    Transportation Needs     SDOH Transportation: No   Social Connections: Medium Risk (03/24/2023)    Social Connections     SDOH Social Isolation: 3 to 5 times a week   Intimate Partner Violence: Low Risk  (03/24/2023)    Intimate Partner Violence     SDOH Domestic Violence: No   Housing Stability: Low Risk  (03/24/2023)    Housing Stability     SDOH Housing Situation: I have housing.     SDOH Housing Worry: No     ADL  Assessment:  Ambulation: 0 - independent  Transferring: 0 - independent  Toileting: 0 - independent  Bathing: 0 - independent  Dressing: 0 - independent  Eating: 0 - independent  Communication: 0 - understands/communicates without difficulty  Swallowing: 0 - swallows foods/liquids without difficulty    OBJECTIVE:    Validated Measures:       04/14/2023    10:00 AM   Most Recent PHQ-9 Scores   PHQ 9 Total 5           04/14/2023    10:00 AM   Most Recent PHQ-9 Scores   PHQ 9 Total 5     Little interest or pleasure in doing things.: 0  Feeling down, depressed, or hopeless: 1  PHQ 2 Total: 1  Trouble falling or staying asleep, or sleeping too much.: 0  Feeling tired or having little energy: 1  Poor appetite or overeating: 2  Feeling bad about yourself/ that you are a failure in the past 2 weeks?: 1  Trouble concentrating on things in the past 2 weeks?: 0  Moving/Speaking slowly or being fidgety or restless  in the past 2 weeks?: 0  Thoughts that you would be better off DEAD, or of hurting yourself in some way.: 0  If you checked off any problems, how difficult have these problems made it for you to do your  work, take care of things at home, or get along with other people?: Not difficult at all  PHQ 9 Total: 5        04/14/2023    10:00 AM   Most Recent GAD-7 Scores   Gad-7 Score Total 4       Gad-7 Score Total: 4 (04/14/23 1000)  Feeling nervous,anxious,on edge: Several days  Not being able to stop or control worrying: Not at all  Worrying too much about different things: More than half the days  Trouble relaxing: Not at all  Being so restless that it is hard to sit still: Several days  Becoming easily annoyed or irritable: Not at all  Feeling afraid as if something awful might happen: Not at all      Tasia Catchings, MSW  04/14/2023, 10:38

## 2023-04-15 ENCOUNTER — Other Ambulatory Visit (INDEPENDENT_AMBULATORY_CARE_PROVIDER_SITE_OTHER): Payer: Self-pay

## 2023-04-15 DIAGNOSIS — E785 Hyperlipidemia, unspecified: Secondary | ICD-10-CM | POA: Diagnosis not present

## 2023-04-15 DIAGNOSIS — L89323 Pressure ulcer of left buttock, stage 3: Secondary | ICD-10-CM | POA: Diagnosis not present

## 2023-04-15 DIAGNOSIS — E1121 Type 2 diabetes mellitus with diabetic nephropathy: Secondary | ICD-10-CM | POA: Diagnosis not present

## 2023-04-15 DIAGNOSIS — F329 Major depressive disorder, single episode, unspecified: Secondary | ICD-10-CM | POA: Diagnosis not present

## 2023-04-15 DIAGNOSIS — E782 Mixed hyperlipidemia: Secondary | ICD-10-CM

## 2023-04-15 DIAGNOSIS — Z7189 Other specified counseling: Secondary | ICD-10-CM

## 2023-04-15 DIAGNOSIS — F338 Other recurrent depressive disorders: Secondary | ICD-10-CM

## 2023-04-15 DIAGNOSIS — I10 Essential (primary) hypertension: Secondary | ICD-10-CM

## 2023-04-15 LAB — MISC LABCORP TEST (SEND OUT)
LabCorp test name: 1
LabCorp test name: 1
Labcorp test code: 96388
Labcorp test code: 96388

## 2023-04-15 NOTE — Progress Notes (Signed)
Dr. Rhoderick Moody, DO      This patient has met the requirements to bill for COMPLEX Chronic Care Management this month.       Complex CCM attestation    I supervised and collaborated with the nurse providing chronic care management services for Salome Arnt who met moderate-high complexity medical decision-making due to modifications of the care plan or treatment plan/medications during this calendar month.    Rhoderick Moody, DO     Chronic Care Management Time Documentation on 04/15/2023 10:26.   Time spent during current encounter is 1 minutes.   Cumulative time during current month's episode (month-to-date) is 85 minutes.          ICD-10-CM    1. Encounter for counseling for care management of patient with chronic conditions and complex health needs using nurse-based model  Z71.89       2. Essential hypertension  I10       3. Mixed hyperlipidemia  E78.2       4. Seasonal depression (CMS HCC)  F33.8             acetaminophen (TYLENOL) 500 mg Oral Tablet, Take 1 Tablet (500 mg total) by mouth Every 4 hours as needed for Pain (pt takes tylenol pm)  alfuzosin (UROXATRAL) 10 mg Oral Tablet Sustained Release 24 hr, TAKE ONE TABLET BY MOUTH EVERY DAY  apixaban (ELIQUIS) 5 mg Oral Tablet, Take 1 Tablet (5 mg total) by mouth Twice daily SHIP TO PATIENT,   [id number]  STATE LICENSE- [id number]  NPI- [id number]  diltiazem HCl (TIAZAC) 240 mg Oral Capsule,Sustained Action 24 hr, TAKE ONE CAPSULE BY MOUTH DAILY  finasteride (PROSCAR) 5 mg Oral Tablet, TAKE 1 TABLET EVERY DAY  flecainide (TAMBOCOR) 100 mg Oral Tablet, TAKE ONE TABLET BY MOUTH TWICE DAILY  furosemide (LASIX) 40 mg Oral Tablet, TAKE ONE TABLET BY MOUTH EVERY DAY  losartan (COZAAR) 50 mg Oral Tablet, Take 1 Tablet (50 mg total) by mouth Once a day  nitroGLYCERIN (NITROSTAT) 0.4 mg Sublingual Tablet, Sublingual, Place 1 Tablet (0.4 mg total) under the tongue Every 5 minutes as needed for Chest pain  potassium chloride (KLOR-CON) 10 mEq Oral Tablet Sustained  Release, TAKE ONE TABLET BY MOUTH EVERY DAY  rosuvastatin (CRESTOR) 10 mg Oral Tablet, Take 1 Tablet (10 mg total) by mouth Every evening  SPIRIVA WITH HANDIHALER 18 mcg Inhalation Capsule, w/Inhalation Device, inhale contents of ONE capsule EVERY DAY AS DIRECTED  tiotropium bromide (SPIRIVA HANDIHALER) 18 mcg Inhalation Capsule, w/Inhalation Device, Take 1 Capsule (18 mcg total) by inhalation Once a day    No facility-administered medications prior to visit.      Thanks,  Your Chronic Care Management Team

## 2023-04-16 ENCOUNTER — Telehealth: Payer: Self-pay

## 2023-04-16 ENCOUNTER — Ambulatory Visit
Payer: Commercial Managed Care - PPO | Attending: Student in an Organized Health Care Education/Training Program | Admitting: Student in an Organized Health Care Education/Training Program

## 2023-04-16 ENCOUNTER — Encounter (HOSPITAL_COMMUNITY): Payer: Self-pay | Admitting: Student in an Organized Health Care Education/Training Program

## 2023-04-16 ENCOUNTER — Ambulatory Visit (HOSPITAL_COMMUNITY): Payer: Self-pay

## 2023-04-16 ENCOUNTER — Other Ambulatory Visit: Payer: Self-pay

## 2023-04-16 VITALS — BP 148/78 | HR 51 | Temp 97.3°F | Resp 16 | Ht 71.0 in | Wt 253.6 lb

## 2023-04-16 DIAGNOSIS — Z87891 Personal history of nicotine dependence: Secondary | ICD-10-CM

## 2023-04-16 DIAGNOSIS — J449 Chronic obstructive pulmonary disease, unspecified: Secondary | ICD-10-CM

## 2023-04-16 DIAGNOSIS — F338 Other recurrent depressive disorders: Secondary | ICD-10-CM

## 2023-04-16 LAB — MIC RESULT

## 2023-04-16 MED ORDER — SPIRIVA WITH HANDIHALER 18 MCG AND INHALATION CAPSULES
18.0000 ug | ORAL_CAPSULE | Freq: Every day | RESPIRATORY_TRACT | 11 refills | Status: DC
Start: 2023-04-16 — End: 2023-04-16

## 2023-04-16 MED ORDER — SPIRIVA WITH HANDIHALER 18 MCG AND INHALATION CAPSULES
18.0000 ug | ORAL_CAPSULE | Freq: Every day | RESPIRATORY_TRACT | 11 refills | Status: DC
Start: 2023-04-16 — End: 2024-02-07

## 2023-04-16 NOTE — Telephone Encounter (Signed)
Patient currently has [facility] on 11/13. Provider requesting to reschedule visit. Routing to provider for additional advise regarding OPAT needs through next scheduled visit.   Staff also spoke with nurse to fax over labs for review and left message for transpiration to call office to coordinate new visit. [phone]  Routing to [employer] as well to follow up on rescheduling appointment.  Valarie Cones, LPN

## 2023-04-16 NOTE — Progress Notes (Addendum)
PULMONARY OFFICE VISIT    Thomas [name], Eckart, 77 y.o. male  Date of Birth:  Nov 10, 1945    IMPRESSION:  Mild Chronic obstructive pulmonary disease gold stage a FEV1 84% predicted  Elevated left hemidiaphragm  Suspected sleep apnea  Former smoker  Secondhand Smoke exposure  Obesity  Atrial fibrillation/flutter with history of cardioversion  Hypertension  Left bundle branch block    RECOMMENDATIONS:  Continue Spiriva  Prior testing from 2020 shows normal diaphragmatic function  Patient continues to decline sleep apnea testing   Encouraged patient to continue weight loss, given beneficial effect on his or all health and is breathing. Offered weight loss clinic referral but patient wishes to continue to lose weight on his own.  Return to clinic in 1 year   Orders Placed This Encounter    SPIRIVA WITH HANDIHALER 18 mcg Inhalation Capsule, w/Inhalation Device      Problem List Items Addressed This Visit    None      CHIEF COMPLAINT:  Shortness of breath    HISTORY OF PRESENT ILLNESS:    Thomas [name] is a 77 y.o. male with PMH of COPD COLD stage B, former smoker, questionable elevated left diaphragm, obesity, hypertension, atrial fibrillation/flutter with history of cardioversion, left bundle branch block.      From my prior note 01/2021:  Patient is here to establish care.  He was diagnosed with COPD 2 years ago and was started on Anoro.  He states that he has been doing pretty well since he has been on air Anoro he.  He noted significant improvement in his shortness of breath.  He does not need albuterol at home.  He has not had any ED visits or hospitalizations.  He has not had any prednisone or antibiotics to use in the last 2 years.  He is able to walk on flat ground without any limitation in his activity, however he is only able to go up 3 flights of stairs before he gets short of breath.  Patient is High risk for OSA given the history of hypertension, elevated BMI, symptoms of snoring, occasional morning headaches,  and Epworth Sleepiness Scale of 10.   Per review of prior records from Care everywhere patient saw Dr. Hadley Pen from pulmonary in [facility] on 04/05/2019 at which time he was planned to be evaluated with stiff testing, sleep apnea testing and pulmonary function testing.  The testing was not completed at that time.  11/11/21  Patient reports that his breathing has gotten worse but he resumed taking Spiriva and since then he has been feeling better.  He also notes that his blood pressure is decreased since he resumed Spiriva use.  He does not report any other complaints.  He has been doing well with exertion.  We discussed at length the high risk of having sleep apnea and the benefits of treating it.  The patient at this time defers testing as he does not want to be bound to wearing a CPAP while going to sleep.  We also discussed alternative therapies if he is found to have sleep apnea however at this time he wishes not to pursue further testing.  Symptoms:  Shortness of breath on exertion  SABA use: none  Triggers:  Exertion.  No known allergic triggers  Intubations:  None  Exacerbations in the last year:  None  Prednisone use in the last year:  None  Antibiotic use in the last year:  None  Disease severity:  COPD  gold stage A  Tobacco:  Patient has been smoking cigars and pipes from age 19 to age 13.   Occupation: Engineer, water   Exposures: (asbestos, coal, silica, mold): none  Travel: none  Pets/birds: none  Family history of lung disorder: none   Family history of lung cancer: none  Brother has OSA    04/13/22 Reports no new respiratory complaints. Has been breathing better since he last 20-30lb over the last few months for walking more. He does not report and wheezing coughing chest discomfort or worsening dyspnea. EWSS today is 3. He recently lost his significant other, and is working on fixing up his old house that he used to live in 5 years ago, with the help of his daughter. He is  hoping to lose a few more pounds so and hopes it will help him breath better so he no longer needs to use Spiriva.    10/13/2022 patient states he is doing okay from a breathing standpoint.  He has not needed his rescue inhaler at all.  He does use Spiriva daily.  He does not feel that he needs it any longer as his breathing has improved significantly after losing 35 lb over the last few months.  No recent exacerbations of chronic obstructive pulmonary disease.  He does have fluctuation in his blood pressure and heart rate.  He has noted that his blood pressures on the lower side he does feel more tired and without energy.  He does have a log of his blood pressure and heart rate.  Advised to discuss with primary care and cardiologist.  He did injure his left leg while cutting firewood.  It is swollen and bruised and erythematous.  However he does have pedal pulses in his able to walk and move the left leg without any limitations.  He denies any paresthesias or difficulty ambulating.  Advised cold compresses elevating leg compression stockings and follow-up with PCP if symptoms do not improve over the next 3-4 days.    Today  04/16/23 patient reports he is doing okay. He has noted some worsening in his dyspnea but states it is likely due to him gaining weight over the last few months. Does not report any exacerbations or respiratory infections. States that he has been watching TV and eating junk food while he does so which is causing his weight gain. He usually uses Spiriva for a month or so then stops for a few weeks. He does note some improvement with Spiriva use.    Past Medical History:   Diagnosis Date    Arthritis     Atrial flutter (CMS HCC)     Cancer (CMS HCC)     Dysuria     Enteritis due to Rotavirus     Gout     Headache     Hemorrhoid     Hypercholesterolemia     Hypertension      Past Surgical History:   Procedure Laterality Date    HX HEMORRHOIDECTOMY        Cannot display prior to admission medications  because the patient has not been admitted in this contact.          No current facility-administered medications for this visit.    No Known Allergies    Social History     Tobacco Use    Smoking status: Former     Types: Cigars    Smokeless tobacco: Current     Types: Chew, Snuff   Substance Use  Topics    Alcohol use: Yes     Alcohol/week: 2.0 standard drinks of alcohol     Types: 2 Cans of beer per week     Comment: few days a week     Family History:  relevant history summarized above in the HPI.   No other family history of pulmonary significance.     ROS:   Other than ROS in the HPI, all other systems were negative.    EXAM:  Temperature: 36.3 C (97.3 F)  Heart Rate: 51  BP (Non-Invasive): (!) 148/78  Respiratory Rate: 16  SpO2: 96 %  General: Seated, in no acute distress  HEENT: Atraumatic normocephalic.   Neck: Trachea in the midline. Supple.   Cardiovascular: S1-S2 regular. No murmur.  Lungs: Good air entry bilaterally. No rales rhonchi or wheezing.  Extremities: No edema clubbing or cyanosis.  Neurological: Awake, alert , oriented. Nonfocal.  Psychiatric: Normal mood and affect.      Labs:    No results found for this or any previous visit (from the past 24 hour(s)).   Imaging Studies:      CXR:  None on file    CT CHEST:  CTA CHEST 01/26/2018, report on care everywhere, no images available for my personal interpretation and review  COMPARISON:  CTA chest 12/21/2017.   FINDINGS:   No evidence of acute pulmonary embolism.  No thoracic aortic aneurysm or   dissection.   The heart is normal in size without pericardial effusion.  No pathologically   enlarged lymph nodes in the chest.   Elevation of the left hemidiaphragm with likely left basilar atelectasis.  No   pneumothorax, pleural effusion, or consolidation.  Central airways are patent.   Nonobstructing left nephrolithiasis.  Upper abdomen otherwise demonstrates no   acute pathology.   Sternum is intact.  Thoracic vertebral body heights are preserved.  No  acute   displaced rib fracture identified.  No suspicious bony lesions.   IMPRESSION:   1.  No evidence of acute pulmonary embolism.   2.  Elevation of the left hemidiaphragm with likely left basilar atelectasis.  No pneumothorax, pleural effusion, or consolidation.   3.  Nonobstructing left nephrolithiasis.     PULMONARY FUNCTION TESTING  03/13/2021  RESULTS:  FEV1/FVC ratio is normal at 71%.  FEV1 is normal at 84% of predicted.  FVC is normal at 88% predicted.  Following albuterol, there is no significant improvement in spirometry.    Total lung capacity is normal at 116% of predicted.  Residual volume is increased at 155% of predicted.  Slow vital capacity is normal at 94% of predicted.  Diffusion capacity is reduced at 67% of predicted.  Flow volume loop is obstructed in appearance.  IMPRESSION:  Normal spirometry.    No significant improvement in spirometry following albuterol   Air trapping as indicated by increased residual volume 155% predicted.  Decreased diffusion capacity at 67% predicted, with no significant correction when considering alveolar volume   Though spirometry component is normal, the patient's lung volumes indicate air trapping, diffusion capacity is decreased and flow volume lopo is obstructed appearance.  This pattern suggests an underlying obstructive pulmonary limitation.  Further clinical and radiographic correlation is advised.    Spirometry 01/28/2018      Data:   Spirometry: The FVC is 3.6 L or 81% predicted which is normal.  The FEV1 is 2.36 L or 71% predicted which is mildly reduced.  The FEV1 to FVC ratio is 0.66 which  is low.  MVV is 82 L/min which is what is expected for this FEV1.   Interpretation: There is mild airflow obstruction.  There is no previous spirometry to compare this to.     Total time spent on encounter was 26 minutes. In addition to face to face time with the patient performing history and exam, this includes time spent before and after the visit reviewing  records, personally reviewing prior imaging, reviewing prior cardiac studies as available, reviewing prior outpatient notes as available, reviewing the patient's medication record, coordinating with office staff and time spent performing final documentation in the medical record.    Lacretia Leigh, MD

## 2023-04-16 NOTE — Nursing Note (Signed)
Behavioral Health Integration--Brief Note  BHI Enrollment Date Patient was enrolled in BHI on 04/14/2023  9:51 AM.     PCP Rhoderick Moody, DO   Collaborating Psychiatrist: Doristine Bosworth, MD  Behavioral Health Case Manager: Tasia Catchings, MSW      MSW mailed pt BHI information packet at the request of assigned BHI case manager, K. Chiplaskey.    Total BHI time spent this encounter is 0 minutes.    Patrick North, MSW  04/16/2023, 13:28  Behavioral Health Case Manager

## 2023-04-19 ENCOUNTER — Telehealth: Payer: Self-pay | Admitting: *Deleted

## 2023-04-19 ENCOUNTER — Other Ambulatory Visit: Payer: Self-pay | Admitting: *Deleted

## 2023-04-19 LAB — MINIMUM INHIBITORY CONC. (1 DRUG)

## 2023-04-19 NOTE — Patient Outreach (Signed)
Mr. Delaughter resides in [facility]. Mr. Sailors is active with chronic care management team RN CM.  Collaboration telephonic meeting with [place], SNF social worker. Care plan meeting is scheduled for tomorrow with son.   Will continue to follow and keep RN CM updated.   Raiford Noble, MSN, RN, BSN [place]  [facility], [facility] RN Post- Acute Care Coordinator Direct Dial: [phone]

## 2023-04-19 NOTE — Telephone Encounter (Signed)
Tct-bill to see how progress is going while in rehab.  No answer will follow back up qt another time.

## 2023-04-20 NOTE — Progress Notes (Signed)
  Care Coordination Note  04/20/2023 Name: JAKEN FREGIA MRN: [id number] DOB: Mar 16, 1946  BRECKON REEVES is a 77 y.o. year old male who is a primary care patient of [facility] and is actively engaged with the care management team. I reached out to [name] by phone today to assist with re-scheduling a follow up visit with the RN Case Manager  Follow up plan: Telephone appointment with care management team member scheduled for: 04/26/2023  Burman Nieves, [facility] Care Coordination Care Guide Direct Dial: [phone]

## 2023-04-22 ENCOUNTER — Ambulatory Visit (HOSPITAL_COMMUNITY): Payer: Self-pay

## 2023-04-22 ENCOUNTER — Ambulatory Visit (HOSPITAL_COMMUNITY): Payer: Self-pay | Admitting: Specialist

## 2023-04-22 DIAGNOSIS — F329 Major depressive disorder, single episode, unspecified: Secondary | ICD-10-CM | POA: Diagnosis not present

## 2023-04-22 DIAGNOSIS — E1121 Type 2 diabetes mellitus with diabetic nephropathy: Secondary | ICD-10-CM | POA: Diagnosis not present

## 2023-04-22 DIAGNOSIS — L89323 Pressure ulcer of left buttock, stage 3: Secondary | ICD-10-CM | POA: Diagnosis not present

## 2023-04-22 DIAGNOSIS — E785 Hyperlipidemia, unspecified: Secondary | ICD-10-CM | POA: Diagnosis not present

## 2023-04-22 DIAGNOSIS — Z634 Disappearance and death of family member: Secondary | ICD-10-CM

## 2023-04-22 DIAGNOSIS — F3289 Other specified depressive episodes: Secondary | ICD-10-CM

## 2023-04-22 DIAGNOSIS — F338 Other recurrent depressive disorders: Secondary | ICD-10-CM

## 2023-04-22 NOTE — Nursing Note (Signed)
BHI  Behavioral Health Integration --Psychiatric Case Review   Behavioral Health Integration - BHI  Status: Enrolled  Effective Dates: 04/14/2023 - present  Responsible Staff: Tasia Catchings, MSW      Behavioral Health Integration Care Team:  MVH:QIONG Viera [place], DO  Collaborating Psychiatrist: Doristine Bosworth, MD    ASSESSMENT/ PLAN: (Actions for PCP to Take)    MSW presented patient this date in psych case collaboration as a new patient. MSW highlighted patient symptoms, biopsychosocial, and apprehension to consideration of medications.   Recommendations for Case Manager (additional assessments or interventions  or linkages/referrals to consider): Continue to build rapport, emotional support, discussion of clubs for fall/winter when unable to golf.   Recommendations for PCP regarding pharmacologic management:  See Dr. Hyacinth Meeker note for details. We recommend changes (if appropriate) be made within 5 business days for continuation of care for patient. Patient is not interested in starting medications, however MSW did discuss the option of having the recommendations in the event there is a change in need.  Total BHI time spent this encounter is 10 minutes.   Total BHI time spent this month is 10 minutes.      SUBJECTIVE/ OBJECTIVE:  Thomas [name]'s case was reviewed by collaborating psychiatrist and BH case manager today in a telephone-video conference call.  The collaborating psychiatrist was provided a summary of the patient's behavioral health by the case manager including results of validated measures compared to baseline.  The psychiatrist reviewed health history and case manager notes in the medical record.    Summary of Past Behavioral Health History:  Behavioral Health Care Management - Patient Level:          PHQ 9 Follow Up Questionnaire            04/14/2023    10:00 AM   Most Recent PHQ-9 Scores   PHQ 9 Total 5           04/14/2023    10:00 AM   Most Recent PHQ-9 Scores   PHQ 9 Total 5     Little interest  or pleasure in doing things.: 0  Feeling down, depressed, or hopeless: 1  PHQ 2 Total: 1  Trouble falling or staying asleep, or sleeping too much.: 0  Feeling tired or having little energy: 1  Poor appetite or overeating: 2  Feeling bad about yourself/ that you are a failure in the past 2 weeks?: 1  Trouble concentrating on things in the past 2 weeks?: 0  Moving/Speaking slowly or being fidgety or restless  in the past 2 weeks?: 0  Thoughts that you would be better off DEAD, or of hurting yourself in some way.: 0  If you checked off any problems, how difficult have these problems made it for you to do your work, take care of things at home, or get along with other people?: Not difficult at all  PHQ 9 Total: 5    Last Gad-7 Scale:            04/14/2023    10:00 AM   Most Recent GAD-7 Scores   Gad-7 Score Total 4       Tasia Catchings, MSW  04/22/2023, 10:18   I have read and reviewed the social worker's note. I agree with the current plan of care.    Doristine Bosworth, MD

## 2023-04-22 NOTE — Progress Notes (Signed)
Behavioral Health Integration--Psychiatric Collaborative Care Note  BHI Enrollment Date: Patient was enrolled in BHI on 04/14/2023  9:51 AM.    PCP: Rhoderick Moody, DO   Collaborating Psychiatrist: Doristine Bosworth, MD, [facility]  Behavioral Health Case Manager: Tasia Catchings, MSW     Reviewed and discussed patient during BHI collaborative call. On 04/22/2023     Recommendations:   This new patient to BHI is a 77 year old male who lives alone after having out live 2 wives.  He complains of seasonal depressive symptoms for the past 15 years.  He feels anxious and low mood particularly when he does not have as much to do during the winter months.  Otherwise he is an active athlete and an avid golfer.  He is a retired Merchandiser, retail.  His original wife died in 05-27-17 followed by 1 daughter in 05/27/2018 and he remarried and that new wife died in 05-28-19 37.  He keeps himself very busy during the summertime but then finds himself aunts he and nervous and more depressed in the winter months.    One interpretation of this patient's symptoms is that he is keeping himself busy to avoid completely confronting the grief over the multiple losses of important people in his life that he has suffered.  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  He male well have seasonal depression as well.  He said he does not want to take medications although Wellbutrin has been FDA approved for this purpose.  Another option that he may embrace is the use of light therapy or phototherapy.  These devices can be obtained on [employer] if you search for a seasonal affective light device.  They should be at least 10,000 Lux in intensity and he should sit in front of it within 3 ft for a period of 30 minutes daily for a week before he determines if it is helping his mood or not.  Continue yet through the winter months and then stopping it in the spring is the usual course of action for this  treatment.  We have also suggested talking to him about increasing his winter activities by such things as joining clubs or finding hobbies to occupy himself during this lower winter months where he is less active.  We will continue to follow with further recommendations over time.  Suggest:  Consider phototherapy or light therapy for seasonal affective disorder as described above verses using Wellbutrin at doses of 150 mg to 300 mg XL per day taken in the morning as an FDA approved pharmacotherapy for seasonal depression.  Consider suggesting to the patient that he find more winter activities to keep him occupied  We will explore further the issue of unresolved grief and suggest appropriate counseling if he is interested.  We will continue to follow as well with further recommendations over time     To ensure continuity of care and full participation in this model, we kindly request that these recommendations be reviewed and addressed within the next five business days. If you have any questions or would like to discuss further, please feel free to reach out to me or the Behavioral Health Case Manager assigned to this patient.     Doristine Bosworth, MD , MD Collaborating Psychiatrist    To discuss further, please call [phone]      The above treatment considerations and suggestions are based on consultations with the patient's case manager and a review  of information available in the medical record.  I have not personally examined the patient.  All recommendations should be implemented with consideration of the patient's relevant prior history and current clinical status.  Please feel free to call me with any questions about the care of this patients.

## 2023-04-23 ENCOUNTER — Other Ambulatory Visit: Payer: Commercial Managed Care - PPO | Attending: Family Medicine

## 2023-04-23 ENCOUNTER — Other Ambulatory Visit: Payer: Self-pay

## 2023-04-23 DIAGNOSIS — I10 Essential (primary) hypertension: Secondary | ICD-10-CM | POA: Insufficient documentation

## 2023-04-23 DIAGNOSIS — E782 Mixed hyperlipidemia: Secondary | ICD-10-CM | POA: Insufficient documentation

## 2023-04-23 LAB — BASIC METABOLIC PANEL, FASTING
ANION GAP: 7 mmol/L (ref 4–13)
BUN/CREA RATIO: 22 (ref 6–22)
BUN: 19 mg/dL (ref 8–25)
CALCIUM: 8.9 mg/dL (ref 8.6–10.3)
CHLORIDE: 108 mmol/L (ref 96–111)
CO2 TOTAL: 31 mmol/L (ref 23–31)
CREATININE: 0.85 mg/dL (ref 0.75–1.35)
ESTIMATED GFR - MALE: >90 mL/min/BSA (ref >=60)
GLUCOSE: 96 mg/dL (ref 70–99)
POTASSIUM: 4 mmol/L (ref 3.5–5.1)
SODIUM: 146 mmol/L — ABNORMAL HIGH (ref 136–145)

## 2023-04-23 LAB — CBC
HCT: 44.7 % (ref 38.9–52.0)
HGB: 14.9 g/dL (ref 13.4–17.5)
MCH: 31.9 pg (ref 26.0–32.0)
MCHC: 33.3 g/dL (ref 31.0–35.5)
MCV: 95.7 fL (ref 78.0–100.0)
MPV: 10.5 fL (ref 8.7–12.5)
PLATELETS: 144 10*3/uL — ABNORMAL LOW (ref 150–400)
RBC: 4.67 10*6/uL (ref 4.50–6.10)
RDW-CV: 12.9 % (ref 11.5–15.5)
WBC: 6.3 10*3/uL (ref 3.7–11.0)

## 2023-04-23 LAB — LIPID PANEL
CHOL/HDL RATIO: 2
CHOLESTEROL: 118 mg/dL (ref 100–200)
HDL CHOL: 59 mg/dL (ref >=50)
LDL CALC: 48 mg/dL (ref <100)
NON-HDL: 59 mg/dL (ref <=190)
TRIGLYCERIDES: 42 mg/dL (ref <150)
VLDL CALC: 6 mg/dL (ref <30)

## 2023-04-23 LAB — AST (SGOT): AST (SGOT): 18 U/L (ref 11–34)

## 2023-04-23 LAB — ALT (SGPT): ALT (SGPT): 11 U/L (ref <43)

## 2023-04-24 ENCOUNTER — Encounter (HOSPITAL_COMMUNITY): Payer: Self-pay | Admitting: *Deleted

## 2023-04-24 ENCOUNTER — Emergency Department (HOSPITAL_COMMUNITY): Payer: No Typology Code available for payment source

## 2023-04-24 ENCOUNTER — Inpatient Hospital Stay (HOSPITAL_COMMUNITY)
Admission: EM | Admit: 2023-04-24 | Discharge: 2023-05-05 | DRG: 871 | Disposition: A | Discharge status: Skilled Nursing Facility | Payer: No Typology Code available for payment source | Source: Skilled Nursing Facility | Attending: Internal Medicine | Admitting: Internal Medicine

## 2023-04-24 ENCOUNTER — Other Ambulatory Visit: Payer: Self-pay

## 2023-04-24 DIAGNOSIS — N1832 Chronic kidney disease, stage 3b: Secondary | ICD-10-CM | POA: Diagnosis present

## 2023-04-24 DIAGNOSIS — R000 Tachycardia, unspecified: Secondary | ICD-10-CM | POA: Diagnosis not present

## 2023-04-24 DIAGNOSIS — J189 Pneumonia, unspecified organism: Principal | ICD-10-CM | POA: Insufficient documentation

## 2023-04-24 DIAGNOSIS — D631 Anemia in chronic kidney disease: Secondary | ICD-10-CM | POA: Diagnosis present

## 2023-04-24 DIAGNOSIS — I4510 Unspecified right bundle-branch block: Secondary | ICD-10-CM | POA: Diagnosis not present

## 2023-04-24 DIAGNOSIS — I130 Hypertensive heart and chronic kidney disease with heart failure and stage 1 through stage 4 chronic kidney disease, or unspecified chronic kidney disease: Secondary | ICD-10-CM | POA: Diagnosis present

## 2023-04-24 DIAGNOSIS — Z818 Family history of other mental and behavioral disorders: Secondary | ICD-10-CM

## 2023-04-24 DIAGNOSIS — I5022 Chronic systolic (congestive) heart failure: Secondary | ICD-10-CM | POA: Diagnosis present

## 2023-04-24 DIAGNOSIS — N400 Enlarged prostate without lower urinary tract symptoms: Secondary | ICD-10-CM | POA: Diagnosis present

## 2023-04-24 DIAGNOSIS — D7219 Other eosinophilia: Secondary | ICD-10-CM | POA: Diagnosis present

## 2023-04-24 DIAGNOSIS — L309 Dermatitis, unspecified: Secondary | ICD-10-CM | POA: Diagnosis present

## 2023-04-24 DIAGNOSIS — E876 Hypokalemia: Secondary | ICD-10-CM | POA: Diagnosis present

## 2023-04-24 DIAGNOSIS — I480 Paroxysmal atrial fibrillation: Secondary | ICD-10-CM | POA: Diagnosis present

## 2023-04-24 DIAGNOSIS — T80212A Local infection due to central venous catheter, initial encounter: Secondary | ICD-10-CM | POA: Diagnosis present

## 2023-04-24 DIAGNOSIS — E1142 Type 2 diabetes mellitus with diabetic polyneuropathy: Secondary | ICD-10-CM

## 2023-04-24 DIAGNOSIS — L03113 Cellulitis of right upper limb: Secondary | ICD-10-CM | POA: Diagnosis present

## 2023-04-24 DIAGNOSIS — I10 Essential (primary) hypertension: Secondary | ICD-10-CM | POA: Diagnosis present

## 2023-04-24 DIAGNOSIS — T368X5A Adverse effect of other systemic antibiotics, initial encounter: Secondary | ICD-10-CM | POA: Diagnosis present

## 2023-04-24 DIAGNOSIS — Z86711 Personal history of pulmonary embolism: Secondary | ICD-10-CM

## 2023-04-24 DIAGNOSIS — Z8614 Personal history of Methicillin resistant Staphylococcus aureus infection: Secondary | ICD-10-CM

## 2023-04-24 DIAGNOSIS — G9341 Metabolic encephalopathy: Secondary | ICD-10-CM

## 2023-04-24 DIAGNOSIS — E7800 Pure hypercholesterolemia, unspecified: Secondary | ICD-10-CM | POA: Diagnosis present

## 2023-04-24 DIAGNOSIS — L89322 Pressure ulcer of left buttock, stage 2: Secondary | ICD-10-CM | POA: Diagnosis present

## 2023-04-24 DIAGNOSIS — E119 Type 2 diabetes mellitus without complications: Secondary | ICD-10-CM

## 2023-04-24 DIAGNOSIS — Z87891 Personal history of nicotine dependence: Secondary | ICD-10-CM | POA: Diagnosis not present

## 2023-04-24 DIAGNOSIS — E1143 Type 2 diabetes mellitus with diabetic autonomic (poly)neuropathy: Secondary | ICD-10-CM | POA: Diagnosis not present

## 2023-04-24 DIAGNOSIS — E1122 Type 2 diabetes mellitus with diabetic chronic kidney disease: Secondary | ICD-10-CM | POA: Diagnosis present

## 2023-04-24 DIAGNOSIS — Z888 Allergy status to other drugs, medicaments and biological substances status: Secondary | ICD-10-CM

## 2023-04-24 DIAGNOSIS — Z7984 Long term (current) use of oral hypoglycemic drugs: Secondary | ICD-10-CM

## 2023-04-24 DIAGNOSIS — J9601 Acute respiratory failure with hypoxia: Principal | ICD-10-CM | POA: Diagnosis present

## 2023-04-24 DIAGNOSIS — J159 Unspecified bacterial pneumonia: Secondary | ICD-10-CM | POA: Diagnosis present

## 2023-04-24 DIAGNOSIS — R911 Solitary pulmonary nodule: Secondary | ICD-10-CM | POA: Diagnosis present

## 2023-04-24 DIAGNOSIS — R6520 Severe sepsis without septic shock: Secondary | ICD-10-CM | POA: Diagnosis present

## 2023-04-24 DIAGNOSIS — L89312 Pressure ulcer of right buttock, stage 2: Secondary | ICD-10-CM | POA: Diagnosis present

## 2023-04-24 DIAGNOSIS — Z6841 Body Mass Index (BMI) 40.0 and over, adult: Secondary | ICD-10-CM | POA: Diagnosis not present

## 2023-04-24 DIAGNOSIS — Z96653 Presence of artificial knee joint, bilateral: Secondary | ICD-10-CM | POA: Diagnosis present

## 2023-04-24 DIAGNOSIS — Z86718 Personal history of other venous thrombosis and embolism: Secondary | ICD-10-CM

## 2023-04-24 DIAGNOSIS — N179 Acute kidney failure, unspecified: Secondary | ICD-10-CM | POA: Diagnosis present

## 2023-04-24 DIAGNOSIS — A419 Sepsis, unspecified organism: Principal | ICD-10-CM | POA: Diagnosis present

## 2023-04-24 DIAGNOSIS — Y848 Other medical procedures as the cause of abnormal reaction of the patient, or of later complication, without mention of misadventure at the time of the procedure: Secondary | ICD-10-CM | POA: Diagnosis present

## 2023-04-24 DIAGNOSIS — Z8349 Family history of other endocrine, nutritional and metabolic diseases: Secondary | ICD-10-CM

## 2023-04-24 DIAGNOSIS — N390 Urinary tract infection, site not specified: Secondary | ICD-10-CM | POA: Diagnosis not present

## 2023-04-24 DIAGNOSIS — Z7901 Long term (current) use of anticoagulants: Secondary | ICD-10-CM

## 2023-04-24 DIAGNOSIS — T8453XA Infection and inflammatory reaction due to internal right knee prosthesis, initial encounter: Secondary | ICD-10-CM | POA: Diagnosis not present

## 2023-04-24 DIAGNOSIS — K5900 Constipation, unspecified: Secondary | ICD-10-CM | POA: Diagnosis present

## 2023-04-24 DIAGNOSIS — Z833 Family history of diabetes mellitus: Secondary | ICD-10-CM

## 2023-04-24 DIAGNOSIS — Z885 Allergy status to narcotic agent status: Secondary | ICD-10-CM

## 2023-04-24 DIAGNOSIS — Z79899 Other long term (current) drug therapy: Secondary | ICD-10-CM

## 2023-04-24 DIAGNOSIS — R0902 Hypoxemia: Secondary | ICD-10-CM | POA: Diagnosis not present

## 2023-04-24 DIAGNOSIS — N2889 Other specified disorders of kidney and ureter: Secondary | ICD-10-CM | POA: Diagnosis not present

## 2023-04-24 DIAGNOSIS — B9562 Methicillin resistant Staphylococcus aureus infection as the cause of diseases classified elsewhere: Secondary | ICD-10-CM | POA: Diagnosis not present

## 2023-04-24 DIAGNOSIS — Z820 Family history of epilepsy and other diseases of the nervous system: Secondary | ICD-10-CM

## 2023-04-24 DIAGNOSIS — R410 Disorientation, unspecified: Secondary | ICD-10-CM | POA: Diagnosis not present

## 2023-04-24 DIAGNOSIS — Z83438 Family history of other disorder of lipoprotein metabolism and other lipidemia: Secondary | ICD-10-CM

## 2023-04-24 DIAGNOSIS — R296 Repeated falls: Secondary | ICD-10-CM | POA: Diagnosis present

## 2023-04-24 LAB — CBC WITH DIFFERENTIAL/PLATELET
Abs Immature Granulocytes: 0.08 10*3/uL — ABNORMAL HIGH (ref 0.00–0.07)
Basophils Absolute: 0.1 10*3/uL (ref 0.0–0.1)
Basophils Relative: 0 %
Eosinophils Absolute: 0.2 10*3/uL (ref 0.0–0.5)
Eosinophils Relative: 1 %
HCT: 32 % — ABNORMAL LOW (ref 39.0–52.0)
Hemoglobin: 9.6 g/dL — ABNORMAL LOW (ref 13.0–17.0)
Immature Granulocytes: 1 %
Lymphocytes Relative: 6 %
Lymphs Abs: 0.6 10*3/uL — ABNORMAL LOW (ref 0.7–4.0)
MCH: 24.7 pg — ABNORMAL LOW (ref 26.0–34.0)
MCHC: 30 g/dL (ref 30.0–36.0)
MCV: 82.5 fL (ref 80.0–100.0)
Monocytes Absolute: 0.6 10*3/uL (ref 0.1–1.0)
Monocytes Relative: 5 %
Neutro Abs: 9.7 10*3/uL — ABNORMAL HIGH (ref 1.7–7.7)
Neutrophils Relative %: 87 %
Platelets: 220 10*3/uL (ref 150–400)
RBC: 3.88 MIL/uL — ABNORMAL LOW (ref 4.22–5.81)
RDW: 19.3 % — ABNORMAL HIGH (ref 11.5–15.5)
WBC: 11.2 10*3/uL — ABNORMAL HIGH (ref 4.0–10.5)
nRBC: 0 % (ref 0.0–0.2)

## 2023-04-24 LAB — COMPREHENSIVE METABOLIC PANEL
ALT: 15 U/L (ref 0–44)
AST: 17 U/L (ref 15–41)
Albumin: 2.4 g/dL — ABNORMAL LOW (ref 3.5–5.0)
Alkaline Phosphatase: 74 U/L (ref 38–126)
Anion gap: 11 (ref 5–15)
BUN: 24 mg/dL — ABNORMAL HIGH (ref 8–23)
CO2: 27 mmol/L (ref 22–32)
Calcium: 8.4 mg/dL — ABNORMAL LOW (ref 8.9–10.3)
Chloride: 99 mmol/L (ref 98–111)
Creatinine, Ser: 1.72 mg/dL — ABNORMAL HIGH (ref 0.61–1.24)
GFR, Estimated: 40 mL/min — ABNORMAL LOW (ref >60)
Glucose, Bld: 130 mg/dL — ABNORMAL HIGH (ref 70–99)
Potassium: 3.5 mmol/L (ref 3.5–5.1)
Sodium: 137 mmol/L (ref 135–145)
Total Bilirubin: 0.7 mg/dL (ref <1.2)
Total Protein: 5.4 g/dL — ABNORMAL LOW (ref 6.5–8.1)

## 2023-04-24 LAB — TROPONIN I (HIGH SENSITIVITY): Troponin I (High Sensitivity): 17 ng/L (ref <18)

## 2023-04-24 LAB — I-STAT CG4 LACTIC ACID, ED
Lactic Acid, Venous: 1.1 mmol/L (ref 0.5–1.9)
Lactic Acid, Venous: 1.4 mmol/L (ref 0.5–1.9)

## 2023-04-24 LAB — PROTIME-INR
INR: 1.4 — ABNORMAL HIGH (ref 0.8–1.2)
Prothrombin Time: 17.7 s — ABNORMAL HIGH (ref 11.4–15.2)

## 2023-04-24 MED ORDER — VANCOMYCIN HCL 2000 MG/400ML IV SOLN
2000.0000 mg | Freq: Once | INTRAVENOUS | Status: AC
  Administered 2023-04-24: 2000 mg via INTRAVENOUS
  Filled 2023-04-24: qty 400

## 2023-04-24 MED ORDER — SODIUM CHLORIDE 0.9 % IV SOLN
2.0000 g | Freq: Once | INTRAVENOUS | Status: AC
Start: 2023-04-24 — End: 2023-04-24
  Administered 2023-04-24: 2 g via INTRAVENOUS
  Filled 2023-04-24: qty 12.5

## 2023-04-24 MED ORDER — LACTATED RINGERS IV BOLUS
1000.0000 mL | Freq: Once | INTRAVENOUS | Status: AC
  Administered 2023-04-24: 1000 mL via INTRAVENOUS

## 2023-04-24 MED ORDER — IOHEXOL 350 MG/ML SOLN
60.0000 mL | Freq: Once | INTRAVENOUS | Status: AC | PRN
  Administered 2023-04-24: 60 mL via INTRAVENOUS

## 2023-04-24 MED ORDER — ACETAMINOPHEN 325 MG PO TABS
650.0000 mg | ORAL_TABLET | Freq: Once | ORAL | Status: AC | PRN
  Administered 2023-04-24: 650 mg via ORAL
  Filled 2023-04-24: qty 2

## 2023-04-24 MED ORDER — METRONIDAZOLE 500 MG/100ML IV SOLN
500.0000 mg | Freq: Once | INTRAVENOUS | Status: AC
  Administered 2023-04-24: 500 mg via INTRAVENOUS
  Filled 2023-04-24: qty 100

## 2023-04-24 NOTE — Assessment & Plan Note (Signed)
-   Patient was recently hospitalized from 10/22 to 10/30 for prosthetic right knee infection with MRSA/Enterococcus faecalis bacteremia s/p irrigation/debridement with poly exchange on 10/23.  He was followed by infectious disease and orthopedic.  He is to remain on IV daptomycin till 12/4 and then Doxy 100 mg twice daily indefinitely for suppressive therapy. -discussed with inpatient pharmacy and okay to continue on IV vancomycin. May need to touch base with ID in the morning

## 2023-04-24 NOTE — H&P (Incomplete)
History and Physical    Patient: Tanner [name] MVH:[id number] DOB: 1946-01-09 DOA: 04/24/2023 DOS: the patient was seen and examined on 04/25/2023 PCP: [facility]  Patient coming from: SNF  Chief Complaint:  Chief Complaint  Patient presents with  . Altered Mental Status   HPI: Tanner [name] is a 77 y.o. male with medical history significant of History of chronic systolic heart failure, paroxysmal atrial fibrillation, history of PE on Eliquis, type 2 diabetes, C. difficile, CKD 3B, morbid obesity, recent bacteremia who presents with altered mental status and hypoxia.  Patient is a limited history. He is oriented to self, place and time but thinks he lives at home alone even though he was sent to from SNF today. Reports several days of coughing but unable to provide other history.   Patient was recently hospitalized from 10/22 to 10/30 for prosthetic right knee infection with MRSA/Enterococcus faecalis bacteremia s/p irrigation/debridement with poly exchange on 10/23.  He was followed by infectious disease and orthopedic.  He is to remain on IV daptomycin till 12/4 and then Doxy 100 mg twice daily indefinitely for suppressive therapy. Prior to that he was hospitalized from 10/3-10/11 with severe sepsis secondary to bibasilar aspiration pneumonia and completed a course of IV ceftriaxone/azithromycin.  On arrival to the ED, he was febrile to 101F, heart rate of 100, BP of 130/84 and was hypoxic to 87% requiring 4 L  CBC with leukocytosis of 11.2 K, hemoglobin higher than usual baseline at 9.6.  Lactate within normal limits BMP with stable creatinine of 1.72  CT head is negative. Chest x-ray with persistent bilateral lower lobe airspace opacity.  Interval worsening of right middle lobe airspace opacity.  Finding could represent infection versus inflammation versus atelectasis versus malignancy.  Review of Systems: As mentioned in the history of present illness. All other  systems reviewed and are negative. Past Medical History:  Diagnosis Date  . Acute kidney failure (HCC)   . Anxiety   . Arthritis   . Back pain   . BPH (benign prostatic hypertrophy)   . CHF (congestive heart failure) (HCC)   . Clostridium difficile infection   . Clotting disorder (HCC)   . Depression   . Diabetes (HCC) 12/11/2016   type 2   . DVT (deep venous thrombosis) (HCC)   . DVT of deep femoral vein, right (HCC) 06/29/2018  . Edema, lower extremity   . High cholesterol   . Hypertension   . Joint pain   . Low back pain potentially associated with spinal stenosis   . Neuromuscular disorder (HCC)   . Neuropathy of lower extremity    bilateral  . OSA (obstructive sleep apnea)    pt denies   . Osteoarthritis   . PE (pulmonary embolism)   . Pneumonia    hx of x 2   . Ventral hernia    Past Surgical History:  Procedure Laterality Date  . CARDIOVERSION N/A 08/19/2022   Procedure: CARDIOVERSION;  Surgeon: Meriam Sprague, MD;  Location: [facility] ENDOSCOPY;  Service: Cardiovascular;  Laterality: N/A;  . EYE SURGERY    . HERNIA REPAIR  1999  . I & D KNEE WITH POLY EXCHANGE Right 04/07/2023   Procedure: IRRIGATION AND DEBRIDEMENT KNEE WITH POLY EXCHANGE;  Surgeon: Durene Romans, MD;  Location: [facility] OR;  Service: Orthopedics;  Laterality: Right;  . TOTAL KNEE ARTHROPLASTY Left 08/26/2020   Procedure: LEFT TOTAL KNEE ARTHROPLASTY;  Surgeon: Gean Birchwood, MD;  Location: [facility] ORS;  Service: Orthopedics;  Laterality: Left;  . TOTAL KNEE ARTHROPLASTY Right 12/15/2022   Procedure: TOTAL KNEE ARTHROPLASTY;  Surgeon: Durene Romans, MD;  Location: [facility] ORS;  Service: Orthopedics;  Laterality: Right;  . TRANSESOPHAGEAL ECHOCARDIOGRAM (CATH LAB) N/A 04/12/2023   Procedure: TRANSESOPHAGEAL ECHOCARDIOGRAM;  Surgeon: Chrystie Nose, MD;  Location: [facility] INVASIVE CV LAB;  Service: Cardiovascular;  Laterality: N/A;   Social History:  reports that he quit smoking about 21 years ago. His smoking use  included cigarettes. He started smoking about 54 years ago. He has a 66.1 pack-year smoking history. He has never used smokeless tobacco. He reports current alcohol use. He reports that he does not use drugs.  Allergies  Allergen Reactions  . Lisinopril Other (See Comments)     Renal impairment  . Amlodipine Swelling  . Codeine Sulfate Itching and Nausea Only    Family History  Problem Relation Age of Onset  . Diabetes Mother   . Pneumonia Mother   . Alzheimer's disease Mother   . Hyperlipidemia Mother   . Thyroid disease Mother   . Depression Mother   . Other Father 24       Drowned on boating accident  . Colon cancer Neg Hx   . Colon polyps Neg Hx     Prior to Admission medications   Medication Sig Start Date End Date Taking? Authorizing Provider  acetaminophen (TYLENOL) 500 MG tablet Take 500-1,000 mg by mouth as needed for moderate pain (pain score 4-6). 02/21/13   Norins, Rosalyn Gess, MD  apixaban (ELIQUIS) 5 MG TABS tablet Take 5 mg by mouth 2 (two) times daily.    [provider]  buPROPion (WELLBUTRIN SR) 150 MG 12 hr tablet Take 150 mg by mouth 2 (two) times daily.    [provider]  carvedilol (COREG) 6.25 MG tablet Take 1 tablet (6.25 mg total) by mouth 2 (two) times daily with a meal. 09/17/22 04/06/23  Myrlene Broker, MD  daptomycin (CUBICIN) IVPB Inject 750 mg into the vein daily. Indication:  MRSA R-knee PJI First Dose: Yes Last Day of Therapy:  05/19/23 Labs - Once weekly:  CBC/D, BMP, and CPK Labs - Once weekly: ESR and CRP Method of administration: IV Push Method of administration may be changed at the discretion of home infusion pharmacist based upon assessment of the patient and/or caregiver's ability to self-administer the medication ordered. 04/14/23 05/20/23  Ghimire, Werner Lean, MD  empagliflozin (JARDIANCE) 25 MG TABS tablet Take 12.5 mg by mouth daily.    [provider]  finasteride (PROSCAR) 5 MG tablet Take 5 mg by mouth at  bedtime.    [provider]  gabapentin (NEURONTIN) 300 MG capsule Take 600 mg by mouth 3 (three) times daily.    [provider]  loratadine (CLARITIN) 10 MG tablet Take 10 mg by mouth daily.    [provider]  Multiple Vitamins-Minerals (MULTIVITAMINS THER. W/MINERALS) TABS Take 1 tablet by mouth daily.    [provider]  nortriptyline (PAMELOR) 10 MG capsule Take 20 mg by mouth at bedtime.    [provider]  pantoprazole (PROTONIX) 40 MG tablet Take 1 tablet (40 mg total) by mouth daily at 12 noon. 04/14/23   Ghimire, Werner Lean, MD  polyethylene glycol (MIRALAX / GLYCOLAX) 17 g packet Take 17 g by mouth daily. 04/14/23   Ghimire, Werner Lean, MD  senna (SENOKOT) 8.6 MG TABS tablet Take 2 tablets (17.2 mg total) by mouth at bedtime. 04/14/23   GhimireWerner Lean, MD  torsemide (DEMADEX) 20 MG tablet TAKE 1 TABLET BY MOUTH EVERY DAY 01/18/23   Myrlene Broker, MD    Physical Exam: Vitals:   04/24/23 2130 04/24/23 2145 04/24/23 2245 04/24/23 2312  BP: (!) 129/54 (!) 128/52  (!) 109/49  Pulse: 89 86 81 80  Resp: 17 17 17 15   Temp:      TempSrc:      SpO2: 97% 97% 97% 100%  Weight:      Height:       Constitutional: NAD, calm, comfortable, Fatigued morbidly obese male lying in bed asleep  Eyes: lids and conjunctivae normal ENMT: Mucous membranes are moist.  Neck: no masses, no thyromegaly Respiratory: clear to auscultation bilaterally, no wheezing, no crackles. Normal respiratory effort. No accessory muscle use. On 3L via [place].  Cardiovascular: Regular rate and rhythm, no murmurs / rubs / gallops. +2 pitting edema of bilateral LE edema up to mid-tibial region.  Abdomen: no tenderness, soft  musculoskeletal: no clubbing / cyanosis. No joint deformity upper and lower extremities. Normal muscle tone.  Skin: Erythema of bilateral distal lower extremity likely from chronic lymphedema. Neurologic: CN 2-12 grossly intact.  Psychiatric: Normal  judgment and insight. Alert and oriented x 3. Normal mood.   Data Reviewed: {Tip this will not be part of the note when signed- Document your independent interpretation of telemetry tracing, EKG, lab, Radiology test or any other diagnostic tests. Add any new diagnostic test ordered today. (Optional):26781} See HPI  Assessment and Plan: * Acute hypoxic respiratory failure (HCC) Secondary to multifocal pneumonia requiring 3 L via nasal cannula -previously admitted for pneumonia with concerns for aspiration.  -keep on dysphagic 3 diet in the past -Continue IV vancomycin and cefepime  Essential hypertension -controlled. Continue Coreg, torsemide  History of pulmonary embolus (PE) -continue Eliquis  Lung nodule -Right lung nodules measuring up to 6 mm. Non-contrast chest CT at 3-6 months is recommended. If the nodules are stable at time of repeat CT, then future CT at 18-24 months  History of MRSA infection - Patient was recently hospitalized from 10/22 to 10/30 for prosthetic right knee infection with MRSA/Enterococcus faecalis bacteremia s/p irrigation/debridement with poly exchange on 10/23.  He was followed by infectious disease and orthopedic.  He is to remain on IV daptomycin till 12/4 and then Doxy 100 mg twice daily indefinitely for suppressive therapy.  Acute metabolic encephalopathy -secondary to sepsis and acute hypoxic respiratory failure secondary to multifocal pneumonia  -continue on IV antibiotics  Sepsis (HCC) - Presented with fever, tachycardia and leukocytosis with finding of multifocal pneumonia on CT chest -Continue IV antibiotics  Chronic systolic CHF (congestive heart failure) (HCC) -b/l LE edema on exam -continue Torsemide   DM2 (diabetes mellitus, type 2) (HCC) -controlled. A1C of 5.3 last month    Morbid obesity (HCC) BMI of 42      Advance Care Planning:   Code Status: Full Code -presumed   Consults: none  Family Communication: none at  bedside  Severity of Illness: The appropriate patient status for this patient is INPATIENT. Inpatient status is judged to be reasonable and necessary in order to provide the required intensity of service to ensure the patient's safety. [name] presenting symptoms, physical exam findings, and initial radiographic and laboratory data in the context of their chronic comorbidities is felt to place them at high risk for further clinical deterioration. Furthermore, it is not anticipated that the patient will be medically stable for discharge from the hospital within 2 midnights of  admission.   * I certify that at the point of admission it is my clinical judgment that the patient will require inpatient hospital care spanning beyond 2 midnights from the point of admission due to high intensity of service, high risk for further deterioration and high frequency of surveillance required.*  Author: Anselm Jungling, DO 04/25/2023 12:01 AM  For on call review [email or web address].

## 2023-04-24 NOTE — ED Triage Notes (Signed)
Pt from [facility] SNF for AMS. Per staff, pt was alert and oriented this morning; became more confused this afternoon. Oxygen sat noted to by 87% on room air. Pt denies any pain or sob "at this time". Pt had recent knee surgery then returned with infection to site. Currently, dried blood noted to R knee with bruising and warmth to the site.

## 2023-04-24 NOTE — ED Provider Notes (Signed)
Tanner [name] EMERGENCY DEPARTMENT AT [facility] Provider Note   CSN: [id number] Arrival date & time: 04/24/23  1921     History {Add pertinent medical, surgical, social history, OB history to HPI:1} Chief Complaint  Patient presents with   Altered Mental Status    Tanner [name] is a 77 y.o. male.  HPI       77 year old male with a history of type 2 diabetes, DVT, hypertension, hyperlipidemia, paroxysmal atrial fibrillation on Eliquis, right knee arthroplasty July 2024, admission for AKI and polymicrobial bacteremia (MRSA/enterococcus faecalis)likely secondary periprosthetic right knee infection (knee cultuure MRSA) 10/22-10/30 on daptomycin until 12/4 who presents with concern for 2 days of cough and altered mental status.  Tanner [name] reports that he has been coughing for the last 2 days, has not been able to raise anything up.  He denies specifically feeling short of breath.  Denies chest pain.  Denies nausea, vomiting, diarrhea, abdominal pain.  Denies any knee pain at all in his right knee, or other areas of pain.  He did have a fall walking out of the bathroom, and his son reports he has had 3 falls in the past 3 days.  His son reports today when he went to go see him he found him falling asleep during conversation, sleepy, not acting himself.  No focal numbness or weakness, has chronic drooping of his right face from prior Bell's palsy, denies any other changes.  Past Medical History:  Diagnosis Date   Acute kidney failure (HCC)    Anxiety    Arthritis    Back pain    BPH (benign prostatic hypertrophy)    CHF (congestive heart failure) (HCC)    Clostridium difficile infection    Clotting disorder (HCC)    Depression    Diabetes (HCC) 12/11/2016   type 2    DVT (deep venous thrombosis) (HCC)    DVT of deep femoral vein, right (HCC) 06/29/2018   Edema, lower extremity    High cholesterol    Hypertension    Joint pain    Low back pain potentially associated  with spinal stenosis    Neuromuscular disorder (HCC)    Neuropathy of lower extremity    bilateral   OSA (obstructive sleep apnea)    pt denies    Osteoarthritis    PE (pulmonary embolism)    Pneumonia    hx of x 2    Ventral hernia      Home Medications Prior to Admission medications   Medication Sig Start Date End Date Taking? Authorizing Provider  acetaminophen (TYLENOL) 500 MG tablet Take 500-1,000 mg by mouth as needed for moderate pain (pain score 4-6). 02/21/13   Norins, Rosalyn Gess, MD  apixaban (ELIQUIS) 5 MG TABS tablet Take 5 mg by mouth 2 (two) times daily.    [provider]  buPROPion (WELLBUTRIN SR) 150 MG 12 hr tablet Take 150 mg by mouth 2 (two) times daily.    [provider]  carvedilol (COREG) 6.25 MG tablet Take 1 tablet (6.25 mg total) by mouth 2 (two) times daily with a meal. 09/17/22 04/06/23  Myrlene Broker, MD  daptomycin (CUBICIN) IVPB Inject 750 mg into the vein daily. Indication:  MRSA R-knee PJI First Dose: Yes Last Day of Therapy:  05/19/23 Labs - Once weekly:  CBC/D, BMP, and CPK Labs - Once weekly: ESR and CRP Method of administration: IV Push Method of administration may be changed at the discretion of home infusion pharmacist  based upon assessment of the patient and/or caregiver's ability to self-administer the medication ordered. 04/14/23 05/20/23  Ghimire, Werner Lean, MD  empagliflozin (JARDIANCE) 25 MG TABS tablet Take 12.5 mg by mouth daily.    [provider]  finasteride (PROSCAR) 5 MG tablet Take 5 mg by mouth at bedtime.    [provider]  gabapentin (NEURONTIN) 300 MG capsule Take 600 mg by mouth 3 (three) times daily.    [provider]  loratadine (CLARITIN) 10 MG tablet Take 10 mg by mouth daily.    [provider]  Multiple Vitamins-Minerals (MULTIVITAMINS THER. W/MINERALS) TABS Take 1 tablet by mouth daily.    [provider]  nortriptyline (PAMELOR) 10 MG capsule Take 20 mg  by mouth at bedtime.    [provider]  pantoprazole (PROTONIX) 40 MG tablet Take 1 tablet (40 mg total) by mouth daily at 12 noon. 04/14/23   Ghimire, Werner Lean, MD  polyethylene glycol (MIRALAX / GLYCOLAX) 17 g packet Take 17 g by mouth daily. 04/14/23   Ghimire, Werner Lean, MD  senna (SENOKOT) 8.6 MG TABS tablet Take 2 tablets (17.2 mg total) by mouth at bedtime. 04/14/23   Ghimire, Werner Lean, MD  torsemide (DEMADEX) 20 MG tablet TAKE 1 TABLET BY MOUTH EVERY DAY 01/18/23   Myrlene Broker, MD      Allergies    Lisinopril, Amlodipine, and Codeine sulfate    Review of Systems   Review of Systems  Physical Exam Updated Vital Signs BP 130/83   Pulse 100   Temp (!) 101 F (38.3 C)   Resp (!) 22   SpO2 99%  Physical Exam  ED Results / Procedures / Treatments   Labs (all labs ordered are listed, but only abnormal results are displayed) Labs Reviewed  CULTURE, BLOOD (ROUTINE X 2)  CULTURE, BLOOD (ROUTINE X 2)  COMPREHENSIVE METABOLIC PANEL  CBC WITH DIFFERENTIAL/PLATELET  PROTIME-INR  URINALYSIS, W/ REFLEX TO CULTURE (INFECTION SUSPECTED)  I-STAT CG4 LACTIC ACID, ED    EKG EKG Interpretation Date/Time:  Saturday April 24 2023 19:24:22 EST Ventricular Rate:  102 PR Interval:  175 QRS Duration:  176 QT Interval:  365 QTC Calculation: 476 R Axis:   -60  Text Interpretation: Sinus tachycardia Probable left atrial enlargement Right bundle branch block LVH with IVCD and secondary repol abnrm Borderline prolonged QT interval Since prior ECG appears to b emore consistent with sinus tachycardia Confirmed by [name] ([id number]) on 04/24/2023 7:41:57 PM  Radiology No results found.  Procedures Procedures  {Document cardiac monitor, telemetry assessment procedure when appropriate:1}  Medications Ordered in ED Medications  acetaminophen (TYLENOL) tablet 650 mg (has no administration in time range)    ED Course/ Medical Decision Making/ A&P   {   Click  here for ABCD2, HEART and other calculatorsREFRESH Note before signing :1}                                77 year old male with a history of type 2 diabetes, DVT, hypertension, hyperlipidemia, paroxysmal atrial fibrillation on Eliquis, right knee arthroplasty July 2024, admission for AKI and polymicrobial bacteremia (MRSA/enterococcus faecalis)likely secondary periprosthetic right knee infection (knee cultuure MRSA) 10/22-10/30 on daptomycin until 12/4 who presents with concern for 2 days of cough and altered mental status.   {Document critical care time when appropriate:1} {Document review of labs and clinical decision tools ie heart score, Chads2Vasc2 etc:1}  {Document your independent  review of radiology images, and any outside records:1} {Document your discussion with family members, caretakers, and with consultants:1} {Document social determinants of health affecting pt's care:1} {Document your decision making why or why not admission, treatments were needed:1} Final Clinical Impression(s) / ED Diagnoses Final diagnoses:  None    Rx / DC Orders ED Discharge Orders     None

## 2023-04-24 NOTE — Assessment & Plan Note (Signed)
-   Presented with fever, tachycardia and leukocytosis with finding of multifocal pneumonia on CT chest -Continue IV antibiotics

## 2023-04-24 NOTE — Progress Notes (Signed)
ED Pharmacy Antibiotic Sign Off An antibiotic consult was received from an ED provider for cefepime and vancomycin per pharmacy dosing for sepsis. A chart review was completed to assess appropriateness.  A single dose of  flagyl  placed by the ED provider.   The following one time order(s) were placed per pharmacy consult:  cefepime 2000 mg x 1 dose vancomycin 2000 mg x 1 dose  Further antibiotic and/or antibiotic pharmacy consults should be ordered by the admitting provider if indicated.   Thank you for allowing pharmacy to be a part of this patient's care.   Delmar Landau, PharmD, BCPS 04/24/2023 7:56 PM ED Clinical Pharmacist -  [phone]

## 2023-04-24 NOTE — Assessment & Plan Note (Signed)
-  Right lung nodules measuring up to 6 mm. Non-contrast chest CT at 3-6 months is recommended. If the nodules are stable at time of repeat CT, then future CT at 18-24 months

## 2023-04-24 NOTE — H&P (Signed)
History and Physical    Patient: Tanner [name] DOB: 12/10/1945 DOA: 04/24/2023 DOS: the patient was seen and examined on 04/24/2023 PCP: [facility]  Patient coming from: SNF  Chief Complaint:  Chief Complaint  Patient presents with   Altered Mental Status   HPI: Tanner [name] is a 77 y.o. male with medical history significant of History of chronic systolic heart failure, paroxysmal atrial fibrillation, history of PE on Eliquis, type 2 diabetes, C. difficile, CKD 3B, morbid obesity, recent bacteremia who presents with altered mental status and hypoxia  Patient was recently hospitalized from 10/22 to 10/30 for prosthetic right knee infection with MRSA/Enterococcus faecalis bacteremia s/p irrigation/debridement with poly exchange on 10/23.  He was followed by infectious disease and orthopedic.  He is to remain on IV daptomycin till 12/4 and then Doxy 100 mg twice daily indefinitely for suppressive therapy. Prior to that he was hospitalized from 10/3-10/11 with severe sepsis secondary to bibasilar aspiration pneumonia and completed a course of IV ceftriaxone/azithromycin.  On arrival to the ED, he was febrile to 101F, heart rate of 100, BP of 130/84 and was hypoxic to 87% requiring 4 L  CBC with leukocytosis of 11.2 K, hemoglobin higher than usual baseline at 9.6.  Lactate within normal limits BMP with stable creatinine of 1.72  CT head is negative. Chest x-ray with persistent bilateral lower lobe airspace opacity.  Interval worsening of right middle lobe airspace opacity.  Finding could represent infection versus inflammation versus atelectasis versus malignancy.  Review of Systems: {ROS_Text:26778} Past Medical History:  Diagnosis Date   Acute kidney failure (HCC)    Anxiety    Arthritis    Back pain    BPH (benign prostatic hypertrophy)    CHF (congestive heart failure) (HCC)    Clostridium difficile infection    Clotting disorder (HCC)    Depression     Diabetes (HCC) 12/11/2016   type 2    DVT (deep venous thrombosis) (HCC)    DVT of deep femoral vein, right (HCC) 06/29/2018   Edema, lower extremity    High cholesterol    Hypertension    Joint pain    Low back pain potentially associated with spinal stenosis    Neuromuscular disorder (HCC)    Neuropathy of lower extremity    bilateral   OSA (obstructive sleep apnea)    pt denies    Osteoarthritis    PE (pulmonary embolism)    Pneumonia    hx of x 2    Ventral hernia    Past Surgical History:  Procedure Laterality Date   CARDIOVERSION N/A 08/19/2022   Procedure: CARDIOVERSION;  Surgeon: Meriam Sprague, MD;  Location: [facility] ENDOSCOPY;  Service: Cardiovascular;  Laterality: N/A;   EYE SURGERY     HERNIA REPAIR  1999   I & D KNEE WITH POLY EXCHANGE Right 04/07/2023   Procedure: IRRIGATION AND DEBRIDEMENT KNEE WITH POLY EXCHANGE;  Surgeon: Durene Romans, MD;  Location: [facility] OR;  Service: Orthopedics;  Laterality: Right;   TOTAL KNEE ARTHROPLASTY Left 08/26/2020   Procedure: LEFT TOTAL KNEE ARTHROPLASTY;  Surgeon: Gean Birchwood, MD;  Location: [facility] ORS;  Service: Orthopedics;  Laterality: Left;   TOTAL KNEE ARTHROPLASTY Right 12/15/2022   Procedure: TOTAL KNEE ARTHROPLASTY;  Surgeon: Durene Romans, MD;  Location: [facility] ORS;  Service: Orthopedics;  Laterality: Right;   TRANSESOPHAGEAL ECHOCARDIOGRAM (CATH LAB) N/A 04/12/2023   Procedure: TRANSESOPHAGEAL ECHOCARDIOGRAM;  Surgeon: Chrystie Nose, MD;  Location: [facility] INVASIVE CV LAB;  Service: Cardiovascular;  Laterality: N/A;   Social History:  reports that he quit smoking about 21 years ago. His smoking use included cigarettes. He started smoking about 54 years ago. He has a 66.1 pack-year smoking history. He has never used smokeless tobacco. He reports current alcohol use. He reports that he does not use drugs.  Allergies  Allergen Reactions   Lisinopril Other (See Comments)     Renal impairment   Amlodipine Swelling   Codeine Sulfate  Itching and Nausea Only    Family History  Problem Relation Age of Onset   Diabetes Mother    Pneumonia Mother    Alzheimer's disease Mother    Hyperlipidemia Mother    Thyroid disease Mother    Depression Mother    Other Father 86       Drowned on boating accident   Colon cancer Neg Hx    Colon polyps Neg Hx     Prior to Admission medications   Medication Sig Start Date End Date Taking? Authorizing Provider  acetaminophen (TYLENOL) 500 MG tablet Take 500-1,000 mg by mouth as needed for moderate pain (pain score 4-6). 02/21/13   Norins, Rosalyn Gess, MD  apixaban (ELIQUIS) 5 MG TABS tablet Take 5 mg by mouth 2 (two) times daily.    [provider]  buPROPion (WELLBUTRIN SR) 150 MG 12 hr tablet Take 150 mg by mouth 2 (two) times daily.    [provider]  carvedilol (COREG) 6.25 MG tablet Take 1 tablet (6.25 mg total) by mouth 2 (two) times daily with a meal. 09/17/22 04/06/23  Myrlene Broker, MD  daptomycin (CUBICIN) IVPB Inject 750 mg into the vein daily. Indication:  MRSA R-knee PJI First Dose: Yes Last Day of Therapy:  05/19/23 Labs - Once weekly:  CBC/D, BMP, and CPK Labs - Once weekly: ESR and CRP Method of administration: IV Push Method of administration may be changed at the discretion of home infusion pharmacist based upon assessment of the patient and/or caregiver's ability to self-administer the medication ordered. 04/14/23 05/20/23  Ghimire, Werner Lean, MD  empagliflozin (JARDIANCE) 25 MG TABS tablet Take 12.5 mg by mouth daily.    [provider]  finasteride (PROSCAR) 5 MG tablet Take 5 mg by mouth at bedtime.    [provider]  gabapentin (NEURONTIN) 300 MG capsule Take 600 mg by mouth 3 (three) times daily.    [provider]  loratadine (CLARITIN) 10 MG tablet Take 10 mg by mouth daily.    [provider]  Multiple Vitamins-Minerals (MULTIVITAMINS THER. W/MINERALS) TABS Take 1 tablet by mouth daily.    [provider]  nortriptyline (PAMELOR) 10 MG capsule Take 20 mg by mouth at bedtime.    [provider]  pantoprazole (PROTONIX) 40 MG tablet Take 1 tablet (40 mg total) by mouth daily at 12 noon. 04/14/23   Ghimire, Werner Lean, MD  polyethylene glycol (MIRALAX / GLYCOLAX) 17 g packet Take 17 g by mouth daily. 04/14/23   Ghimire, Werner Lean, MD  senna (SENOKOT) 8.6 MG TABS tablet Take 2 tablets (17.2 mg total) by mouth at bedtime. 04/14/23   Maretta Bees, MD  torsemide (DEMADEX) 20 MG tablet TAKE 1 TABLET BY MOUTH EVERY DAY 01/18/23   Myrlene Broker, MD    Physical Exam: Vitals:   04/24/23 2121 04/24/23 2122 04/24/23 2130 04/24/23 2145  BP:   (!) 129/54 (!) 128/52  Pulse:   89 86  Resp:   17 17  Temp: 98.3 F (36.8 C)     TempSrc: Oral     SpO2:   97% 97%  Weight:  111 kg    Height:  5\' 4"  (1.626 m)     *** Data Reviewed: {Tip this will not be part of the note when signed- Document your independent interpretation of telemetry tracing, EKG, lab, Radiology test or any other diagnostic tests. Add any new diagnostic test ordered today. (Optional):26781} {Results:26384}  Assessment and Plan: No notes have been filed under this hospital service. Service: Hospitalist     Advance Care Planning:   Code Status: Prior ***  Consults: ***  Family Communication: ***  Severity of Illness: {Observation/Inpatient:21159}  Author: Anselm Jungling, DO 04/24/2023 10:10 PM  For on call review [email or web address].

## 2023-04-24 NOTE — Assessment & Plan Note (Addendum)
Secondary to multifocal pneumonia requiring 3 L via nasal cannula -previously admitted for pneumonia with concerns for aspiration.  -keep on dysphagic 3 diet in the past -Continue IV vancomycin and cefepime

## 2023-04-24 NOTE — ED Notes (Signed)
Pt taken to CT.

## 2023-04-24 NOTE — Assessment & Plan Note (Signed)
-  secondary to sepsis and acute hypoxic respiratory failure secondary to multifocal pneumonia  -continue on IV antibiotics

## 2023-04-25 DIAGNOSIS — T8453XA Infection and inflammatory reaction due to internal right knee prosthesis, initial encounter: Secondary | ICD-10-CM

## 2023-04-25 DIAGNOSIS — J9601 Acute respiratory failure with hypoxia: Secondary | ICD-10-CM | POA: Diagnosis not present

## 2023-04-25 LAB — BASIC METABOLIC PANEL
Anion gap: 9 (ref 5–15)
BUN: 22 mg/dL (ref 8–23)
CO2: 29 mmol/L (ref 22–32)
Calcium: 8.1 mg/dL — ABNORMAL LOW (ref 8.9–10.3)
Chloride: 101 mmol/L (ref 98–111)
Creatinine, Ser: 1.64 mg/dL — ABNORMAL HIGH (ref 0.61–1.24)
GFR, Estimated: 43 mL/min — ABNORMAL LOW (ref >60)
Glucose, Bld: 110 mg/dL — ABNORMAL HIGH (ref 70–99)
Potassium: 3.5 mmol/L (ref 3.5–5.1)
Sodium: 139 mmol/L (ref 135–145)

## 2023-04-25 LAB — URINALYSIS, W/ REFLEX TO CULTURE (INFECTION SUSPECTED)
Bacteria, UA: NONE SEEN
Bilirubin Urine: NEGATIVE
Glucose, UA: >=500 mg/dL — AB
Hgb urine dipstick: NEGATIVE
Ketones, ur: NEGATIVE mg/dL
Nitrite: NEGATIVE
Protein, ur: NEGATIVE mg/dL
Specific Gravity, Urine: 1.02 (ref 1.005–1.030)
pH: 7 (ref 5.0–8.0)

## 2023-04-25 LAB — CBC
HCT: 29.2 % — ABNORMAL LOW (ref 39.0–52.0)
Hemoglobin: 8.6 g/dL — ABNORMAL LOW (ref 13.0–17.0)
MCH: 24.6 pg — ABNORMAL LOW (ref 26.0–34.0)
MCHC: 29.5 g/dL — ABNORMAL LOW (ref 30.0–36.0)
MCV: 83.7 fL (ref 80.0–100.0)
Platelets: 201 10*3/uL (ref 150–400)
RBC: 3.49 MIL/uL — ABNORMAL LOW (ref 4.22–5.81)
RDW: 19.5 % — ABNORMAL HIGH (ref 11.5–15.5)
WBC: 9.2 10*3/uL (ref 4.0–10.5)
nRBC: 0 % (ref 0.0–0.2)

## 2023-04-25 LAB — BRAIN NATRIURETIC PEPTIDE: B Natriuretic Peptide: 174.5 pg/mL — ABNORMAL HIGH (ref 0.0–100.0)

## 2023-04-25 MED ORDER — PANTOPRAZOLE SODIUM 40 MG PO TBEC
40.0000 mg | DELAYED_RELEASE_TABLET | Freq: Every day | ORAL | Status: DC
  Administered 2023-04-25 – 2023-05-02 (×8): 40 mg via ORAL
  Filled 2023-04-25 (×9): qty 1

## 2023-04-25 MED ORDER — CHLORHEXIDINE GLUCONATE CLOTH 2 % EX PADS
6.0000 | MEDICATED_PAD | Freq: Every day | CUTANEOUS | Status: DC
  Administered 2023-04-26 – 2023-05-04 (×8): 6 via TOPICAL

## 2023-04-25 MED ORDER — APIXABAN 5 MG PO TABS
5.0000 mg | ORAL_TABLET | Freq: Two times a day (BID) | ORAL | Status: DC
  Administered 2023-04-25 – 2023-05-05 (×22): 5 mg via ORAL
  Filled 2023-04-25 (×22): qty 1

## 2023-04-25 MED ORDER — SENNA 8.6 MG PO TABS
2.0000 | ORAL_TABLET | Freq: Every day | ORAL | Status: DC
  Administered 2023-04-25 – 2023-05-02 (×9): 17.2 mg via ORAL
  Filled 2023-04-25 (×12): qty 2

## 2023-04-25 MED ORDER — CARVEDILOL 6.25 MG PO TABS
6.2500 mg | ORAL_TABLET | Freq: Two times a day (BID) | ORAL | Status: DC
  Administered 2023-04-25 – 2023-05-05 (×20): 6.25 mg via ORAL
  Filled 2023-04-25 (×20): qty 1

## 2023-04-25 MED ORDER — SODIUM CHLORIDE 0.9 % IV SOLN
2.0000 g | Freq: Two times a day (BID) | INTRAVENOUS | Status: AC
  Administered 2023-04-25 – 2023-05-01 (×13): 2 g via INTRAVENOUS
  Filled 2023-04-25 (×14): qty 12.5

## 2023-04-25 MED ORDER — VANCOMYCIN HCL 750 MG/150ML IV SOLN
750.0000 mg | INTRAVENOUS | Status: DC
  Administered 2023-04-25 – 2023-04-26 (×2): 750 mg via INTRAVENOUS
  Filled 2023-04-25 (×2): qty 150

## 2023-04-25 MED ORDER — BUPROPION HCL ER (SR) 150 MG PO TB12
150.0000 mg | ORAL_TABLET | Freq: Two times a day (BID) | ORAL | Status: DC
  Administered 2023-04-25 – 2023-05-05 (×21): 150 mg via ORAL
  Filled 2023-04-25 (×22): qty 1

## 2023-04-25 MED ORDER — GABAPENTIN 300 MG PO CAPS
600.0000 mg | ORAL_CAPSULE | Freq: Three times a day (TID) | ORAL | Status: DC
  Administered 2023-04-25 – 2023-04-30 (×16): 600 mg via ORAL
  Filled 2023-04-25 (×16): qty 2

## 2023-04-25 MED ORDER — SODIUM CHLORIDE 0.9% FLUSH: 10.0000 mL | INTRAVENOUS | Status: DC | PRN

## 2023-04-25 MED ORDER — FINASTERIDE 5 MG PO TABS
5.0000 mg | ORAL_TABLET | Freq: Every day | ORAL | Status: DC
  Administered 2023-04-25 – 2023-05-04 (×11): 5 mg via ORAL
  Filled 2023-04-25 (×11): qty 1

## 2023-04-25 MED ORDER — LORATADINE 10 MG PO TABS
10.0000 mg | ORAL_TABLET | Freq: Every day | ORAL | Status: DC
  Administered 2023-04-25 – 2023-05-05 (×11): 10 mg via ORAL
  Filled 2023-04-25 (×11): qty 1

## 2023-04-25 MED ORDER — SODIUM CHLORIDE 0.9% FLUSH
10.0000 mL | Freq: Two times a day (BID) | INTRAVENOUS | Status: DC
  Administered 2023-04-26 – 2023-05-02 (×13): 10 mL

## 2023-04-25 MED ORDER — POLYETHYLENE GLYCOL 3350 17 G PO PACK
17.0000 g | PACK | Freq: Every day | ORAL | Status: DC
  Administered 2023-04-27 – 2023-05-02 (×5): 17 g via ORAL
  Filled 2023-04-25 (×10): qty 1

## 2023-04-25 MED ORDER — TORSEMIDE 20 MG PO TABS
20.0000 mg | ORAL_TABLET | Freq: Every day | ORAL | Status: DC
  Administered 2023-04-25 – 2023-05-01 (×7): 20 mg via ORAL
  Filled 2023-04-25 (×7): qty 1

## 2023-04-25 MED ORDER — NORTRIPTYLINE HCL 10 MG PO CAPS
20.0000 mg | ORAL_CAPSULE | Freq: Every day | ORAL | Status: DC
  Administered 2023-04-25 – 2023-05-04 (×11): 20 mg via ORAL
  Filled 2023-04-25 (×12): qty 2

## 2023-04-25 NOTE — Assessment & Plan Note (Signed)
-  controlled. A1C of 5.3 last month

## 2023-04-25 NOTE — Assessment & Plan Note (Signed)
continue Eliquis Currently bradycardic.  Discussed with family at this point would like to concentrate on comfort discontinue telemetry   

## 2023-04-25 NOTE — Consult Note (Signed)
[facility]  Total days of antibiotics 2 (vanco/cefepime)  Reason for Consult: pneumonia   Referring Physician: Idelle Leech  Principal Problem:   Acute hypoxic respiratory failure (HCC) Active Problems:   Essential hypertension   History of pulmonary embolus (PE)   Morbid obesity (HCC)   DM2 (diabetes mellitus, type 2) (HCC)   Multifocal pneumonia   Chronic systolic CHF (congestive heart failure) (HCC)   Sepsis (HCC)   Acute metabolic encephalopathy   History of MRSA infection   Lung nodule    HPI: Tanner [name] is a 77 y.o. male  with history of right knee tka on 7/24 but then had pji with mrsa and e.faecalis bacteremia but MRSA only from knee cultures s/p polyexchange by dr Charlann Boxer on 10/23. discharged from hospital on 10/29. Was discharged on daptomycni through 12/3 then on chronic suppression with doxycycline. He was doing well with daptomycin without difficulty. In the past week, started to have dry cough and increasing shortness of breath.at ED on 11/8 found to be febrile til 101F, hypoxi to 87% on 4L. Cxr showing bilateral airspace disease. CT showing R >L multifocal patchy opacities. His abtx changed to vancomycin and cefepime. Labs showed mildly elevated wbc but 87% N,   Past Medical History:  Diagnosis Date   Acute kidney failure (HCC)    Anxiety    Arthritis    Back pain    BPH (benign prostatic hypertrophy)    CHF (congestive heart failure) (HCC)    Clostridium difficile infection    Clotting disorder (HCC)    Depression    Diabetes (HCC) 12/11/2016   type 2    DVT (deep venous thrombosis) (HCC)    DVT of deep femoral vein, right (HCC) 06/29/2018   Edema, lower extremity    High cholesterol    Hypertension    Joint pain    Low back pain potentially associated with spinal stenosis    Neuromuscular disorder (HCC)    Neuropathy of lower extremity    bilateral   OSA (obstructive sleep apnea)    pt denies    Osteoarthritis    PE  (pulmonary embolism)    Pneumonia    hx of x 2    Ventral hernia     Allergies:  Allergies  Allergen Reactions   Lisinopril Other (See Comments)     Renal impairment   Amlodipine Swelling   Codeine Sulfate Itching and Nausea Only    MEDICATIONS:  apixaban  5 mg Oral BID   buPROPion  150 mg Oral BID   carvedilol  6.25 mg Oral BID WC   finasteride  5 mg Oral QHS   gabapentin  600 mg Oral TID   loratadine  10 mg Oral Daily   nortriptyline  20 mg Oral QHS   pantoprazole  40 mg Oral Q1200   polyethylene glycol  17 g Oral Daily   senna  2 tablet Oral QHS   torsemide  20 mg Oral Daily    Social History   Tobacco Use   Smoking status: Former    Current packs/day: 0.00    Average packs/day: 2.0 packs/day for 33.0 years (66.1 ttl pk-yrs)    Types: Cigarettes    Start date: 08/30/1968    Quit date: 07/02/2001    Years since quitting: 21.8   Smokeless tobacco: Never   Tobacco comments:    quit 12 years ago  Vaping Use   Vaping status: Never Used  Substance Use Topics  Alcohol use: Yes    Comment: seldom    Drug use: No    Family History  Problem Relation Age of Onset   Diabetes Mother    Pneumonia Mother    Alzheimer's disease Mother    Hyperlipidemia Mother    Thyroid disease Mother    Depression Mother    Other Father 46       Drowned on boating accident   Colon cancer Neg Hx    Colon polyps Neg Hx     Review of Systems - 12 point ros is negative other than what is mentioned above   OBJECTIVE: Temp:  [97.5 F (36.4 C)-101 F (38.3 C)] 98 F (36.7 C) (11/10 0749) Pulse Rate:  [66-100] 73 (11/10 1520) Resp:  [15-23] 17 (11/10 1520) BP: (100-144)/(46-83) 124/52 (11/10 1520) SpO2:  [87 %-100 %] 92 % (11/10 1520) Weight:  [100.4 kg-111 kg] 100.4 kg (11/10 0210) Physical Exam  Constitutional: He is oriented to person, place, and time. He appears well-developed and well-nourished. No distress.  HENT:  Mouth/Throat: Oropharynx is clear and moist. No  oropharyngeal exudate.  Cardiovascular: Normal rate, regular rhythm and normal heart sounds. Exam reveals no gallop and no friction rub.  No murmur heard.  Pulmonary/Chest: Effort normal and breath sounds normal. No respiratory distress. He has no wheezes.  Abdominal: Soft. Bowel sounds are normal. He exhibits no distension. There is no tenderness.  Lymphadenopathy:  He has no cervical adenopathy.  Neurological: He is alert and oriented to person, place, and time.  Skin: +cellulitis to picc line site Psychiatric: He has a normal mood and affect. His behavior is normal.    LABS: Results for orders placed or performed during the hospital encounter of 04/24/23 (from the past 48 hour(s))  Comprehensive metabolic panel     Status: Abnormal   Collection Time: 04/24/23  7:42 PM  Result Value Ref Range   Sodium 137 135 - 145 mmol/L   Potassium 3.5 3.5 - 5.1 mmol/L   Chloride 99 98 - 111 mmol/L   CO2 27 22 - 32 mmol/L   Glucose, Bld 130 (H) 70 - 99 mg/dL    Comment: Glucose reference range applies only to samples taken after fasting for at least 8 hours.   BUN 24 (H) 8 - 23 mg/dL   Creatinine, Ser 8.29 (H) 0.61 - 1.24 mg/dL   Calcium 8.4 (L) 8.9 - 10.3 mg/dL   Total Protein 5.4 (L) 6.5 - 8.1 g/dL   Albumin 2.4 (L) 3.5 - 5.0 g/dL   AST 17 15 - 41 U/L   ALT 15 0 - 44 U/L   Alkaline Phosphatase 74 38 - 126 U/L   Total Bilirubin 0.7 <1.2 mg/dL   GFR, Estimated 40 (L) >60 mL/min    Comment: (NOTE) Calculated using the CKD-EPI Creatinine Equation (2021)    Anion gap 11 5 - 15    Comment: Performed at [facility], [address], [place], [place] [address]  CBC with Differential     Status: Abnormal   Collection Time: 04/24/23  7:42 PM  Result Value Ref Range   WBC 11.2 (H) 4.0 - 10.5 K/uL   RBC 3.88 (L) 4.22 - 5.81 MIL/uL   Hemoglobin 9.6 (L) 13.0 - 17.0 g/dL   HCT 08.6 (L) 57.8 - 46.9 %   MCV 82.5 80.0 - 100.0 fL   MCH 24.7 (L) 26.0 - 34.0 pg   MCHC 30.0 30.0 - 36.0 g/dL    RDW 62.9 (  H) 11.5 - 15.5 %   Platelets 220 150 - 400 K/uL   nRBC 0.0 0.0 - 0.2 %   Neutrophils Relative % 87 %   Neutro Abs 9.7 (H) 1.7 - 7.7 K/uL   Lymphocytes Relative 6 %   Lymphs Abs 0.6 (L) 0.7 - 4.0 K/uL   Monocytes Relative 5 %   Monocytes Absolute 0.6 0.1 - 1.0 K/uL   Eosinophils Relative 1 %   Eosinophils Absolute 0.2 0.0 - 0.5 K/uL   Basophils Relative 0 %   Basophils Absolute 0.1 0.0 - 0.1 K/uL   Immature Granulocytes 1 %   Abs Immature Granulocytes 0.08 (H) 0.00 - 0.07 K/uL    Comment: Performed at [facility], [address] [place], [place] [address]  Protime-INR     Status: Abnormal   Collection Time: 04/24/23  7:42 PM  Result Value Ref Range   Prothrombin Time 17.7 (H) 11.4 - 15.2 seconds   INR 1.4 (H) 0.8 - 1.2    Comment: (NOTE) INR goal varies based on device and disease states. Performed at [facility], [address], [place], [place] [address]   I-Stat Lactic Acid, ED     Status: None   Collection Time: 04/24/23  7:49 PM  Result Value Ref Range   Lactic Acid, Venous 1.1 0.5 - 1.9 mmol/L  Culture, blood (Routine x 2)     Status: None (Preliminary result)   Collection Time: 04/24/23  8:33 PM   Specimen: BLOOD LEFT HAND  Result Value Ref Range   Specimen Description BLOOD LEFT HAND    Special Requests      BOTTLES DRAWN AEROBIC AND ANAEROBIC Blood Culture adequate volume   Culture      NO GROWTH < 12 HOURS Performed at [facility], [address], [place], [place] [address]    Report Status PENDING   Culture, blood (Routine x 2)     Status: None (Preliminary result)   Collection Time: 04/24/23  9:10 PM   Specimen: BLOOD  Result Value Ref Range   Specimen Description BLOOD LEFT ANTECUBITAL    Special Requests      BOTTLES DRAWN AEROBIC AND ANAEROBIC Blood Culture adequate volume   Culture      NO GROWTH < 12 HOURS Performed at [facility], [address], [place], [place] [address]    Report Status PENDING   I-Stat  Lactic Acid, ED     Status: None   Collection Time: 04/24/23  9:27 PM  Result Value Ref Range   Lactic Acid, Venous 1.4 0.5 - 1.9 mmol/L  Troponin I (High Sensitivity)     Status: None   Collection Time: 04/24/23 10:04 PM  Result Value Ref Range   Troponin I (High Sensitivity) 17 <18 ng/L    Comment: (NOTE) Elevated high sensitivity troponin I (hsTnI) values and significant  changes across serial measurements may suggest ACS but many other  chronic and acute conditions are known to elevate hsTnI results.  Refer to the "Links" section for chest pain algorithms and additional  guidance. Performed at [facility], [address], [place], [place] [address]   Urinalysis, w/ Reflex to Culture (Infection Suspected) -Urine, Clean Catch     Status: Abnormal   Collection Time: 04/25/23  1:00 AM  Result Value Ref Range   Specimen Source URINE, CLEAN CATCH    Color, Urine YELLOW YELLOW   APPearance CLEAR CLEAR   Specific Gravity,  Urine 1.020 1.005 - 1.030   pH 7.0 5.0 - 8.0   Glucose, UA >=500 (A) NEGATIVE mg/dL   Hgb urine dipstick NEGATIVE NEGATIVE   Bilirubin Urine NEGATIVE NEGATIVE   Ketones, ur NEGATIVE NEGATIVE mg/dL   Protein, ur NEGATIVE NEGATIVE mg/dL   Nitrite NEGATIVE NEGATIVE   Leukocytes,Ua TRACE (A) NEGATIVE   RBC / HPF 0-5 0 - 5 RBC/hpf   WBC, UA 0-5 0 - 5 WBC/hpf    Comment:        Reflex urine culture not performed if WBC <=10, OR if Squamous epithelial cells >5. If Squamous epithelial cells >5 suggest recollection.    Bacteria, UA NONE SEEN NONE SEEN   Squamous Epithelial / HPF 0-5 0 - 5 /HPF    Comment: Performed at [facility], [address], [place], [place] [address]  Brain natriuretic peptide     Status: Abnormal   Collection Time: 04/25/23  3:05 AM  Result Value Ref Range   B Natriuretic Peptide 174.5 (H) 0.0 - 100.0 pg/mL    Comment: Performed at [facility], [address] [place], [place] [address]  CBC     Status: Abnormal    Collection Time: 04/25/23  3:05 AM  Result Value Ref Range   WBC 9.2 4.0 - 10.5 K/uL   RBC 3.49 (L) 4.22 - 5.81 MIL/uL   Hemoglobin 8.6 (L) 13.0 - 17.0 g/dL   HCT 44.0 (L) 34.7 - 42.5 %   MCV 83.7 80.0 - 100.0 fL   MCH 24.6 (L) 26.0 - 34.0 pg   MCHC 29.5 (L) 30.0 - 36.0 g/dL   RDW 95.6 (H) 38.7 - 56.4 %   Platelets 201 150 - 400 K/uL   nRBC 0.0 0.0 - 0.2 %    Comment: Performed at [facility], [address] [place], [place] [address]  Basic metabolic panel     Status: Abnormal   Collection Time: 04/25/23  3:05 AM  Result Value Ref Range   Sodium 139 135 - 145 mmol/L   Potassium 3.5 3.5 - 5.1 mmol/L   Chloride 101 98 - 111 mmol/L   CO2 29 22 - 32 mmol/L   Glucose, Bld 110 (H) 70 - 99 mg/dL    Comment: Glucose reference range applies only to samples taken after fasting for at least 8 hours.   BUN 22 8 - 23 mg/dL   Creatinine, Ser 1.88 (H) 0.61 - 1.24 mg/dL   Calcium 8.1 (L) 8.9 - 10.3 mg/dL   GFR, Estimated 43 (L) >60 mL/min    Comment: (NOTE) Calculated using the CKD-EPI Creatinine Equation (2021)    Anion gap 9 5 - 15    Comment: Performed at [facility], [address] [place], [place] [address]    MICRO:  IMAGING: CT Chest W Contrast  Result Date: 04/24/2023 CLINICAL DATA:  Shortness of breath EXAM: CT CHEST WITH CONTRAST TECHNIQUE: Multidetector CT imaging of the chest was performed during intravenous contrast administration. RADIATION DOSE REDUCTION: This exam was performed according to the departmental dose-optimization program which includes automated exposure control, adjustment of the mA and/or kV according to patient size and/or use of iterative reconstruction technique. CONTRAST:  60mL OMNIPAQUE IOHEXOL 350 MG/ML SOLN COMPARISON:  CT angiogram chest 09/23/2017 FINDINGS: Cardiovascular: Right upper extremity PICC terminates in the right brachiocephalic vein. Heart is mildly enlarged. There is no pericardial effusion. Aorta is normal in size.  Mediastinum/Nodes: There are enlarged right hilar lymph nodes  measuring up to 12 mm. Visualized esophagus and thyroid gland are within normal limits. Lungs/Pleura: Mild emphysema present. Multifocal patchy airspace and ground-glass opacities are seen throughout the right lung most prominent in the right upper lobe. There also minimal patchy ground-glass and airspace opacities in the inferior left lower lobe. There is no pleural effusion or pneumothorax. There is a 6 mm right lower lobe nodule image 6/93. There are additional 6 mm nodules in the right middle lobe image 6/88. Upper Abdomen: No acute abnormality. Musculoskeletal: Degenerative changes affect the spine. IMPRESSION: 1. Multifocal patchy airspace and ground-glass opacities throughout the right lung and inferior left lower lobe compatible with multifocal pneumonia. 2. Right hilar lymphadenopathy, likely reactive. 3. Right lung nodules measuring up to 6 mm. Non-contrast chest CT at 3-6 months is recommended. If the nodules are stable at time of repeat CT, then future CT at 18-24 months (from today's scan) is considered optional for low-risk patients, but is recommended for high-risk patients. This recommendation follows the consensus statement: Guidelines for Management of Incidental Pulmonary Nodules Detected on CT Images: From the Fleischner Society 2017; Radiology 2017; 284:228-243. 4. Emphysema. Emphysema (ICD10-J43.9). Electronically Signed   By: Darliss Cheney M.D.   On: 04/24/2023 23:32   CT Head Wo Contrast  Result Date: 04/24/2023 CLINICAL DATA:  Trauma EXAM: CT HEAD WITHOUT CONTRAST TECHNIQUE: Contiguous axial images were obtained from the base of the skull through the vertex without intravenous contrast. RADIATION DOSE REDUCTION: This exam was performed according to the departmental dose-optimization program which includes automated exposure control, adjustment of the mA and/or kV according to patient size and/or use of iterative reconstruction  technique. COMPARISON:  Head CT 04/06/2023 FINDINGS: Brain: No evidence of acute infarction, hemorrhage, hydrocephalus, extra-axial collection or mass lesion/mass effect. Again seen is mild diffuse atrophy and mild periventricular white matter hypodensity, likely chronic small vessel ischemic change. Vascular: Atherosclerotic calcifications are present within the cavernous internal carotid arteries. Skull: Normal. Negative for fracture or focal lesion. Sinuses/Orbits: No acute finding. Other: None. IMPRESSION: 1. No acute intracranial process. 2. Mild diffuse atrophy and mild chronic small vessel ischemic change. Electronically Signed   By: Darliss Cheney M.D.   On: 04/24/2023 21:27   DG Chest Portable 1 View  Result Date: 04/24/2023 CLINICAL DATA:  new oxygen dependent EXAM: PORTABLE CHEST 1 VIEW COMPARISON:  Chest x-ray 04/06/2023, chest x-ray 09/23/2017, chest x-ray 08/18/2022 FINDINGS: Right PICC with tip overlying the proximal superior vena cava. The heart and mediastinal contours are within normal limits. Low lung volumes. Interval worsening of right middle lobe airspace opacity. Persistent bilateral lower lobe airspace opacities. No focal consolidation. No pulmonary edema. Possible trace right pleural effusion. No pneumothorax. No acute osseous abnormality. IMPRESSION: 1. Persistent bilateral lower lobe airspace opacities. Interval worsening of right middle lobe airspace opacity. Findings could represent infection versus inflammation versus atelectasis versus malignancy. Recommend CT chest with intravenous contrast (not CTPA) for further evaluation. 2. Possible trace right pleural effusion. 3. Right PICC with tip overlying the proximal superior vena cava. Electronically Signed   By: Tish Frederickson M.D.   On: 04/24/2023 21:23    HISTORICAL MICRO/IMAGING  Assessment/Plan:  77yo M with right knee PJI on daptomycin but now found to have multifocal pneumonia. Possibly could be related to daptomycin.    Recommend to continue with cefepime and vancomycin for the time being  Recommend to change out picc line from right arm to left arm due to localize cellulitis, small pustules  Will finish out mrsa pji  course with vancomycin but closely monitor kidney function.  More recs to follow.  Duke Salvia Drue Second MD MPH [facility] [phone]

## 2023-04-25 NOTE — Assessment & Plan Note (Signed)
-  controlled. Continue Coreg, torsemide

## 2023-04-25 NOTE — Progress Notes (Signed)
Pharmacy Antibiotic Note  Tanner [name] is a 77 y.o. male admitted on 04/24/2023 with altered mental status and hypoxia with multifocal pneumonia.  Pharmacy has been consulted for cefepime and vancomycin dosing.  PMH includes recent hospitalization for prosthetic knee infection with MRSA/enterococcus faecalis bacteremia s/p irrigation/debridement with poly exchange on 04/07/23. Patient previously on daptomycin until 05/19/2023 then to be converted to doxycycline 100mg  BID for suppressive therapy   WBC 11.2, sCr 1.72, lactate 1.4, Tmax 101 Blood cultures collected Given 1 dose of vancomycin 2g IV, cefepime 2g, and flagyl 500mg  IV in the ED  Plan: -Cefepime 2g IV every 12 hours -Vancomycin 2g IV x1 -Vancomycin 750mg  IV every 24 hours (AUC 466, IBW, Vd 0.5) -Monitor renal function -Follow up signs of clinical improvement, LOT, de-escalation of antibiotics   Height: 5\' 4"  (162.6 cm) Weight: 111 kg (244 lb 11.4 oz) IBW/kg (Calculated) : 59.2  Temp (24hrs), Avg:99.7 F (37.6 C), Min:98.3 F (36.8 C), Max:101 F (38.3 C)  Recent Labs  Lab 04/24/23 1942 04/24/23 1949 04/24/23 2127  WBC 11.2*  --   --   CREATININE 1.72*  --   --   LATICACIDVEN  --  1.1 1.4    Estimated Creatinine Clearance: 40 mL/min (A) (by C-G formula based on SCr of 1.72 mg/dL (H)).    Allergies  Allergen Reactions   Lisinopril Other (See Comments)     Renal impairment   Amlodipine Swelling   Codeine Sulfate Itching and Nausea Only    Antimicrobials this admission: Cefepime 11/9 >>  Flagyl 11/9 >>  Vancomycin 11/9 >>  Microbiology results: 10/24 bcx ngtd 10/23 bcx MRSA 10/22 bcx e faecalis + MRSA, BCID w/ strep species (no vancomycin resistance) 10/22 knee cx MRSA 11/9 BCx:   Thank you for allowing pharmacy to be a part of this patient's care.  Arabella Merles, PharmD. Clinical Pharmacist 04/25/2023 12:25 AM

## 2023-04-25 NOTE — Evaluation (Signed)
Physical Therapy Evaluation Patient Details Name: Tanner [name] MRN: [id number] DOB: [birth year] Today's Date: 04/25/2023  History of Present Illness  The pt is a 77 yo male presenting from SNF 11/9 with AMS and acute hypoxic resp failure due to multifocal PNA. PMH includes: CHF, afib, PE on Eliquis, DM II, C. difficile, CKD 3B, morbid obesity, L TKR 2022, R TKR 12/15/2022, and admission 04/06/23 for RLE cellulitis with I&D with poly exchange on 10/23.   Clinical Impression  Pt in bed upon arrival of PT, agreeable to evaluation at this time. Prior to this admission, pt was working on progressing ambulation distance with PT at rehab, reports staff still assisting for all OOB  mobility and all ADLs. The pt was able to complete bed mobility with minA this session, and sit-stand from EOB and recliner with minA-CGA. He was limited to 20-35 ft ambulation at a time due to fatigue and LE feeling "shaky" but had no overt LOB or need for increased assist as he fatigued. The pt reports good progress at rehab and would like to return to continue with progress once medically stable for d/c, is still not safe to return home alone at this point.   Pt on 2L upon my arrival with SpO2 at 100%. With trial of RA at rest, SpO2 remained 94-99%. When checked during ambulation, SpO2 to low of 93%. Pt denies SOB. Pt left on RA and RN aware.     If plan is discharge home, recommend the following: Assistance with cooking/housework;Direct supervision/assist for medications management;Direct supervision/assist for financial management;Assist for transportation;Help with stairs or ramp for entrance;A little help with walking and/or transfers;A little help with bathing/dressing/bathroom   Can travel by private vehicle   No    Equipment Recommendations None recommended by PT  Recommendations for Other Services       Functional Status Assessment Patient has had a recent decline in their functional status and demonstrates the  ability to make significant improvements in function in a reasonable and predictable amount of time.     Precautions / Restrictions Precautions Precautions: Fall Precaution Comments: watch O2 Restrictions Weight Bearing Restrictions: No      Mobility  Bed Mobility Overal bed mobility: Needs Assistance Bed Mobility: Sit to Supine Rolling: Min assist   Supine to sit: Min assist, HOB elevated     General bed mobility comments: minA for LE and trunk movements    Transfers Overall transfer level: Needs assistance Equipment used: Rolling walker (2 wheels) Transfers: Sit to/from Stand Sit to Stand: Min assist   Step pivot transfers: Min assist       General transfer comment: minA to CGA with cues for hand placement. minA from recliner. x 5 in session    Ambulation/Gait Ambulation/Gait assistance: Min assist Gait Distance (Feet): 20 Feet (+ 25 ft  + 35 ft) Assistive device: Rolling walker (2 wheels) Gait Pattern/deviations: Step-to pattern, Decreased stride length, Knee flexed in stance - right, Knee flexed in stance - left, Shuffle Gait velocity: decreased Gait velocity interpretation: <1.31 ft/sec, indicative of household ambulator   General Gait Details: seated rest due to legs feeling shaky    Balance Overall balance assessment: Needs assistance Sitting-balance support: No upper extremity supported, Feet supported Sitting balance-Leahy Scale: Good     Standing balance support: Bilateral upper extremity supported, During functional activity Standing balance-Leahy Scale: Poor Standing balance comment: RW and min A at times  Pertinent Vitals/Pain Pain Assessment Pain Assessment: No/denies pain Pain Intervention(s): Limited activity within patient's tolerance, Monitored during session, Repositioned    Home Living Family/patient expects to be discharged to:: Skilled nursing facility                   Additional  Comments: from SNF and hopeful to return to [facility]    Prior Function Prior Level of Function : Independent/Modified Independent;Driving;History of Falls (last six months)             Mobility Comments: prior to knee replacement, was independent but falling, reports walking short distances at rehab with assistance and RW since d/c in oct ADLs Comments: assist from rehab staff since d/c     Extremity/Trunk Assessment   Upper Extremity Assessment Upper Extremity Assessment: Overall WFL for tasks assessed    Lower Extremity Assessment Lower Extremity Assessment: RLE deficits/detail RLE Deficits / Details: limited knee extension, but [facility] for knee flexion. RLE Sensation: WNL RLE Coordination: WNL    Cervical / Trunk Assessment Cervical / Trunk Assessment: Kyphotic  Communication   Communication Communication: No apparent difficulties Cueing Techniques: Verbal cues  Cognition Arousal: Alert Behavior During Therapy: WFL for tasks assessed/performed Overall Cognitive Status: Difficult to assess Area of Impairment: Safety/judgement, Problem solving                         Safety/Judgement: Decreased awareness of safety   Problem Solving: Requires verbal cues General Comments: distracting environment with multiple family members, therapist, and RN. pt able to follow commands well, when answering questions he will make jokes, difficult to fully discern if using humor to mask cog deficits or not.        General Comments General comments (skin integrity, edema, etc.): VSS on RA        Assessment/Plan    PT Assessment Patient needs continued PT services  PT Problem List Decreased strength;Decreased mobility;Decreased safety awareness;Decreased range of motion;Decreased activity tolerance;Decreased balance;Decreased knowledge of use of DME;Pain;Cardiopulmonary status limiting activity;Decreased knowledge of precautions;Decreased cognition;Decreased  coordination;Obesity       PT Treatment Interventions DME instruction;Therapeutic activities;Gait training;Therapeutic exercise;Patient/family education;Balance training;Stair training;Functional mobility training;Cognitive remediation    PT Goals (Current goals can be found in the Care Plan section)  Acute Rehab PT Goals Patient Stated Goal: to feel stronger so I can go home by myself (pt lives alone) PT Goal Formulation: With patient Time For Goal Achievement: 05/09/23 Potential to Achieve Goals: Good    Frequency Min 1X/week        AM-PAC PT "6 Clicks" Mobility  Outcome Measure Help needed turning from your back to your side while in a flat bed without using bedrails?: A Little Help needed moving from lying on your back to sitting on the side of a flat bed without using bedrails?: A Little Help needed moving to and from a bed to a chair (including a wheelchair)?: A Little Help needed standing up from a chair using your arms (e.g., wheelchair or bedside chair)?: A Little Help needed to walk in hospital room?: A Lot Help needed climbing 3-5 steps with a railing? : Total 6 Click Score: 15    End of Session Equipment Utilized During Treatment: Gait belt Activity Tolerance: Patient tolerated treatment well Patient left: with call bell/phone within reach;in bed;in chair;with chair alarm set;with family/visitor present Nurse Communication: Mobility status PT Visit Diagnosis: Other abnormalities of gait and mobility (R26.89);Muscle weakness (generalized) (M62.81);Repeated falls (R29.6)  Time: 1610-9604 PT Time Calculation (min) (ACUTE ONLY): 42 min   Charges:   PT Evaluation $PT Eval Moderate Complexity: 1 Mod PT Treatments $Gait Training: 8-22 mins $Therapeutic Exercise: 8-22 mins PT General Charges $$ ACUTE PT VISIT: 1 Visit         Vickki Muff, PT, DPT   Acute Rehabilitation Department Office [phone] Secure Chat Communication Preferred  Ronnie Derby 04/25/2023, 3:48 PM

## 2023-04-25 NOTE — Progress Notes (Signed)
PROGRESS NOTE    Tanner [name]  ZOX:[id number] DOB: 1945/06/20 DOA: 04/24/2023 [facility]   Brief Narrative:  This 77 y.o. male with medical history significant for chronic systolic heart failure, paroxysmal atrial fibrillation, history of PE on Eliquis, type 2 diabetes, C. difficile, CKD 3B, morbid obesity, recent bacteremia who presents with altered mental status and hypoxia. Patient was recently hospitalized from 10/22 to 10/30 for prosthetic right knee infection with MRSA/Enterococcus faecalis bacteremia s/p irrigation/debridement with poly exchange on 10/23.  He was followed by infectious disease and orthopedic.  He is to remain on IV daptomycin till 12/4 and then Doxy 100 mg twice daily indefinitely for suppressive therapy. He was found to be febrile with a temp of 101, hypoxia with SpO2 87% on 4 L.  Chest x-ray shows persistent bilateral lower lobe airspace opacities.  Findings could represent infection versus inflammation versus malignancy or atelectasis.  Patient is admitted for further evaluation  Assessment & Plan:   Principal Problem:   Acute hypoxic respiratory failure (HCC) Active Problems:   Sepsis (HCC)   History of MRSA infection   Essential hypertension   History of pulmonary embolus (PE)   Morbid obesity (HCC)   DM2 (diabetes mellitus, type 2) (HCC)   Multifocal pneumonia   Chronic systolic CHF (congestive heart failure) (HCC)   Acute metabolic encephalopathy   Lung nodule  Acute hypoxic respiratory failure: Multifocal pneumonia: Likely secondary to multifocal pneumonia requiring 3 L of supplemental oxygen. Previously admitted for pneumonia with concerns for aspiration.  Continue dysphagia 3 diet. Empirically started on IV vancomycin, cefepime, Flagyl. De-escalate antibiotic as able to.   Sepsis [facility]): He presented with fever, tachycardia and leukocytosis with finding of multifocal pneumonia on CT chest Continue IV antibiotics   History of  MRSA infection: Patient was recently hospitalized from 10/22 to 10/30 for prosthetic right knee infection with MRSA/Enterococcus faecalis bacteremia s/p irrigation/debridement with poly exchange on 10/23.  He was followed by infectious disease and orthopedics.  He is to remain on IV daptomycin till 12/4 and then Doxy 100 mg twice daily indefinitely for suppressive therapy. Discussed with inpatient pharmacy and okay to continue on IV vancomycin.  Infectious disease consulted, follow-up recommendation.   Essential hypertension: Controlled. Continue Coreg, torsemide.   History of pulmonary embolus (PE) Continue Eliquis.   Lung nodule: Right lung nodules measuring up to 6 mm.  Non-contrast chest CT at 3-6 months is recommended. If the nodules are stable at time of repeat CT, then future CT at 18-24 months.   Acute metabolic encephalopathy: Likely sec. to sepsis and acute hypoxic respiratory failure secondary to multifocal pneumonia  Continue IV antibiotics. Patient appears back to his baseline mental status.   Chronic systolic CHF (congestive heart failure) (HCC) He is found to have  Bilateral LE edema on exam Continue Torsemide.   DM2 (diabetes mellitus, type 2) (HCC) Controlled. A1C of 5.3 last month      Morbid obesity (HCC) BMI of 42. Diet and exercise discussed in detail.   DVT prophylaxis:Eliquis Code Status:Full code Family Communication:No family at bed side Disposition Plan:     Status is: Inpatient Remains inpatient appropriate because: Admitted for acute hypoxic respiratory failure likely due to pneumonia.    Consultants:  Infectious diseases.  Procedures:  Antimicrobials:  Anti-infectives (From admission, onward)    Start     Dose/Rate Route Frequency Ordered Stop   04/25/23 2000  vancomycin (VANCOREADY) IVPB 750 mg/150 mL        750 mg  150 mL/hr over 60 Minutes Intravenous Every 24 hours 04/25/23 0033     04/25/23 0800  ceFEPIme (MAXIPIME) 2 g in sodium  chloride 0.9 % 100 mL IVPB        2 g 200 mL/hr over 30 Minutes Intravenous Every 12 hours 04/25/23 0033     04/24/23 2000  metroNIDAZOLE (FLAGYL) IVPB 500 mg        500 mg 100 mL/hr over 60 Minutes Intravenous  Once 04/24/23 1950 04/24/23 2314   04/24/23 2000  ceFEPIme (MAXIPIME) 2 g in sodium chloride 0.9 % 100 mL IVPB        2 g 200 mL/hr over 30 Minutes Intravenous  Once 04/24/23 1956 04/24/23 2158   04/24/23 2000  vancomycin (VANCOREADY) IVPB 2000 mg/400 mL        2,000 mg 200 mL/hr over 120 Minutes Intravenous  Once 04/24/23 1956 04/25/23 0043      Subjective: Patient was seen and examined at bedside. Overnight events noted.   Patient reports doing much better.  He he denies any pain in the right knee incision.  Objective: Vitals:   04/25/23 0210 04/25/23 0215 04/25/23 0540 04/25/23 0749  BP:  (!) 139/57 139/65 (!) 144/61  Pulse:  74 66 72  Resp:  16 16 17   Temp:  (!) 97.5 F (36.4 C) 98.2 F (36.8 C) 98 F (36.7 C)  TempSrc:  Oral  Oral  SpO2:  97% 100% 100%  Weight: 100.4 kg     Height: 5\' 4"  (1.626 m)       Intake/Output Summary (Last 24 hours) at 04/25/2023 1156 Last data filed at 04/25/2023 0043 Gross per 24 hour  Intake 1404.68 ml  Output --  Net 1404.68 ml   Filed Weights   04/24/23 2122 04/25/23 0210  Weight: 111 kg 100.4 kg    Examination:  General exam: Appears calm and comfortable, not in any acute distress.  Deconditioned Respiratory system: Clear to auscultation. Respiratory effort normal.  RR 15 Cardiovascular system: S1 & S2 heard, RRR. No JVD, murmurs, rubs, gallops or clicks. No pedal edema. Gastrointestinal system: Abdomen is non distended, soft and non tender.  Normal bowel sounds heard. Central nervous system: Alert and oriented x 3. No focal neurological deficits. Extremities: Right Knee incision noted, not infected. Skin: No rashes, lesions or ulcers Psychiatry: Judgement and insight appear normal. Mood & affect appropriate.      Data Reviewed: I have personally reviewed following labs and imaging studies  CBC: Recent Labs  Lab 04/24/23 1942 04/25/23 0305  WBC 11.2* 9.2  NEUTROABS 9.7*  --   HGB 9.6* 8.6*  HCT 32.0* 29.2*  MCV 82.5 83.7  PLT 220 201   Basic Metabolic Panel: Recent Labs  Lab 04/24/23 1942 04/25/23 0305  NA 137 139  K 3.5 3.5  CL 99 101  CO2 27 29  GLUCOSE 130* 110*  BUN 24* 22  CREATININE 1.72* 1.64*  CALCIUM 8.4* 8.1*   GFR: Estimated Creatinine Clearance: 39.7 mL/min (A) (by C-G formula based on SCr of 1.64 mg/dL (H)). Liver Function Tests: Recent Labs  Lab 04/24/23 1942  AST 17  ALT 15  ALKPHOS 74  BILITOT 0.7  PROT 5.4*  ALBUMIN 2.4*   No results for input(s): "LIPASE", "AMYLASE" in the last 168 hours. No results for input(s): "AMMONIA" in the last 168 hours. Coagulation Profile: Recent Labs  Lab 04/24/23 1942  INR 1.4*   Cardiac Enzymes: No results for input(s): "CKTOTAL", "CKMB", "CKMBINDEX", "TROPONINI" in the  last 168 hours. BNP (last 3 results) No results for input(s): "PROBNP" in the last 8760 hours. HbA1C: No results for input(s): "HGBA1C" in the last 72 hours. CBG: No results for input(s): "GLUCAP" in the last 168 hours. Lipid Profile: No results for input(s): "CHOL", "HDL", "LDLCALC", "TRIG", "CHOLHDL", "LDLDIRECT" in the last 72 hours. Thyroid Function Tests: No results for input(s): "TSH", "T4TOTAL", "FREET4", "T3FREE", "THYROIDAB" in the last 72 hours. Anemia Panel: No results for input(s): "VITAMINB12", "FOLATE", "FERRITIN", "TIBC", "IRON", "RETICCTPCT" in the last 72 hours. Sepsis Labs: Recent Labs  Lab 04/24/23 1949 04/24/23 2127  LATICACIDVEN 1.1 1.4    Recent Results (from the past 240 hour(s))  Culture, blood (Routine x 2)     Status: None (Preliminary result)   Collection Time: 04/24/23  8:33 PM   Specimen: BLOOD LEFT HAND  Result Value Ref Range Status   Specimen Description BLOOD LEFT HAND  Final   Special Requests    Final    BOTTLES DRAWN AEROBIC AND ANAEROBIC Blood Culture adequate volume   Culture   Final    NO GROWTH < 12 HOURS Performed at [facility], [address], [place], [place] [address]    Report Status PENDING  Incomplete  Culture, blood (Routine x 2)     Status: None (Preliminary result)   Collection Time: 04/24/23  9:10 PM   Specimen: BLOOD  Result Value Ref Range Status   Specimen Description BLOOD LEFT ANTECUBITAL  Final   Special Requests   Final    BOTTLES DRAWN AEROBIC AND ANAEROBIC Blood Culture adequate volume   Culture   Final    NO GROWTH < 12 HOURS Performed at [facility], [address], [place], [place] [address]    Report Status PENDING  Incomplete    Radiology Studies: CT Chest W Contrast  Result Date: 04/24/2023 CLINICAL DATA:  Shortness of breath EXAM: CT CHEST WITH CONTRAST TECHNIQUE: Multidetector CT imaging of the chest was performed during intravenous contrast administration. RADIATION DOSE REDUCTION: This exam was performed according to the departmental dose-optimization program which includes automated exposure control, adjustment of the mA and/or kV according to patient size and/or use of iterative reconstruction technique. CONTRAST:  60mL OMNIPAQUE IOHEXOL 350 MG/ML SOLN COMPARISON:  CT angiogram chest 09/23/2017 FINDINGS: Cardiovascular: Right upper extremity PICC terminates in the right brachiocephalic vein. Heart is mildly enlarged. There is no pericardial effusion. Aorta is normal in size. Mediastinum/Nodes: There are enlarged right hilar lymph nodes measuring up to 12 mm. Visualized esophagus and thyroid gland are within normal limits. Lungs/Pleura: Mild emphysema present. Multifocal patchy airspace and ground-glass opacities are seen throughout the right lung most prominent in the right upper lobe. There also minimal patchy ground-glass and airspace opacities in the inferior left lower lobe. There is no pleural effusion or pneumothorax. There is  a 6 mm right lower lobe nodule image 6/93. There are additional 6 mm nodules in the right middle lobe image 6/88. Upper Abdomen: No acute abnormality. Musculoskeletal: Degenerative changes affect the spine. IMPRESSION: 1. Multifocal patchy airspace and ground-glass opacities throughout the right lung and inferior left lower lobe compatible with multifocal pneumonia. 2. Right hilar lymphadenopathy, likely reactive. 3. Right lung nodules measuring up to 6 mm. Non-contrast chest CT at 3-6 months is recommended. If the nodules are stable at time of repeat CT, then future CT at 18-24 months (from today's scan) is considered optional for low-risk patients, but is recommended for high-risk patients. This recommendation follows the consensus  statement: Guidelines for Management of Incidental Pulmonary Nodules Detected on CT Images: From the Fleischner Society 2017; Radiology 2017; 284:228-243. 4. Emphysema. Emphysema (ICD10-J43.9). Electronically Signed   By: Darliss Cheney M.D.   On: 04/24/2023 23:32   CT Head Wo Contrast  Result Date: 04/24/2023 CLINICAL DATA:  Trauma EXAM: CT HEAD WITHOUT CONTRAST TECHNIQUE: Contiguous axial images were obtained from the base of the skull through the vertex without intravenous contrast. RADIATION DOSE REDUCTION: This exam was performed according to the departmental dose-optimization program which includes automated exposure control, adjustment of the mA and/or kV according to patient size and/or use of iterative reconstruction technique. COMPARISON:  Head CT 04/06/2023 FINDINGS: Brain: No evidence of acute infarction, hemorrhage, hydrocephalus, extra-axial collection or mass lesion/mass effect. Again seen is mild diffuse atrophy and mild periventricular white matter hypodensity, likely chronic small vessel ischemic change. Vascular: Atherosclerotic calcifications are present within the cavernous internal carotid arteries. Skull: Normal. Negative for fracture or focal lesion.  Sinuses/Orbits: No acute finding. Other: None. IMPRESSION: 1. No acute intracranial process. 2. Mild diffuse atrophy and mild chronic small vessel ischemic change. Electronically Signed   By: Darliss Cheney M.D.   On: 04/24/2023 21:27   DG Chest Portable 1 View  Result Date: 04/24/2023 CLINICAL DATA:  new oxygen dependent EXAM: PORTABLE CHEST 1 VIEW COMPARISON:  Chest x-ray 04/06/2023, chest x-ray 09/23/2017, chest x-ray 08/18/2022 FINDINGS: Right PICC with tip overlying the proximal superior vena cava. The heart and mediastinal contours are within normal limits. Low lung volumes. Interval worsening of right middle lobe airspace opacity. Persistent bilateral lower lobe airspace opacities. No focal consolidation. No pulmonary edema. Possible trace right pleural effusion. No pneumothorax. No acute osseous abnormality. IMPRESSION: 1. Persistent bilateral lower lobe airspace opacities. Interval worsening of right middle lobe airspace opacity. Findings could represent infection versus inflammation versus atelectasis versus malignancy. Recommend CT chest with intravenous contrast (not CTPA) for further evaluation. 2. Possible trace right pleural effusion. 3. Right PICC with tip overlying the proximal superior vena cava. Electronically Signed   By: Tish Frederickson M.D.   On: 04/24/2023 21:23     Scheduled Meds:  apixaban  5 mg Oral BID   buPROPion  150 mg Oral BID   carvedilol  6.25 mg Oral BID WC   finasteride  5 mg Oral QHS   gabapentin  600 mg Oral TID   loratadine  10 mg Oral Daily   nortriptyline  20 mg Oral QHS   pantoprazole  40 mg Oral Q1200   polyethylene glycol  17 g Oral Daily   senna  2 tablet Oral QHS   torsemide  20 mg Oral Daily   Continuous Infusions:  ceFEPime (MAXIPIME) IV 2 g (04/25/23 0932)   vancomycin       LOS: 1 day    Time spent: 50 mins    Willeen Niece, MD [facility]   If 7PM-7AM, please contact night-coverage

## 2023-04-25 NOTE — Plan of Care (Signed)

## 2023-04-25 NOTE — Assessment & Plan Note (Signed)
BMI of 42 

## 2023-04-25 NOTE — Assessment & Plan Note (Signed)
-  b/l LE edema on exam -continue Torsemide

## 2023-04-25 NOTE — ED Notes (Signed)
ED TO INPATIENT HANDOFF REPORT  ED Nurse Name and Phone #: [phone]  S Name/Age/Gender Tanner [name] 77 y.o. male Room/Bed: 040C/040C  Code Status   Code Status: Full Code  Home/SNF/Other Rehab Patient oriented to: self, place, time, and situation Is this baseline? Yes   Triage Complete: Triage complete  Chief Complaint Sepsis [facility]) [A41.9]  Triage Note Pt from [facility] for AMS. Per staff, pt was alert and oriented this morning; became more confused this afternoon. Oxygen sat noted to by 87% on room air. Pt denies any pain or sob "at this time". Pt had recent knee surgery then returned with infection to site. Currently, dried blood noted to R knee with bruising and warmth to the site.    Allergies Allergies  Allergen Reactions   Lisinopril Other (See Comments)     Renal impairment   Amlodipine Swelling   Codeine Sulfate Itching and Nausea Only    Level of Care/Admitting Diagnosis ED Disposition     ED Disposition  Admit   Condition  --   Comment  Hospital Area: [facility] [[id number]]  Level of Care: Telemetry Medical [[id number]]  May admit patient to [facility] or [facility] if equivalent level of care is available:: No  Covid Evaluation: Asymptomatic - no recent exposure (last 10 days) testing not required  Diagnosis: Sepsis [facility]) [[id number]]  Admitting Physician: Anselm Jungling [[id number]]  Attending Physician: Anselm Jungling [[id number]]  Certification:: I certify this patient will need inpatient services for at least 2 midnights  Expected Medical Readiness: 04/27/2023          B Medical/Surgery History Past Medical History:  Diagnosis Date   Acute kidney failure (HCC)    Anxiety    Arthritis    Back pain    BPH (benign prostatic hypertrophy)    CHF (congestive heart failure) (HCC)    Clostridium difficile infection    Clotting disorder (HCC)    Depression    Diabetes (HCC) 12/11/2016   type 2    DVT (deep venous thrombosis) (HCC)     DVT of deep femoral vein, right (HCC) 06/29/2018   Edema, lower extremity    High cholesterol    Hypertension    Joint pain    Low back pain potentially associated with spinal stenosis    Neuromuscular disorder (HCC)    Neuropathy of lower extremity    bilateral   OSA (obstructive sleep apnea)    pt denies    Osteoarthritis    PE (pulmonary embolism)    Pneumonia    hx of x 2    Ventral hernia    Past Surgical History:  Procedure Laterality Date   CARDIOVERSION N/A 08/19/2022   Procedure: CARDIOVERSION;  Surgeon: Meriam Sprague, MD;  Location: [facility] ENDOSCOPY;  Service: Cardiovascular;  Laterality: N/A;   EYE SURGERY     HERNIA REPAIR  1999   I & D KNEE WITH POLY EXCHANGE Right 04/07/2023   Procedure: IRRIGATION AND DEBRIDEMENT KNEE WITH POLY EXCHANGE;  Surgeon: Durene Romans, MD;  Location: [facility] OR;  Service: Orthopedics;  Laterality: Right;   TOTAL KNEE ARTHROPLASTY Left 08/26/2020   Procedure: LEFT TOTAL KNEE ARTHROPLASTY;  Surgeon: Gean Birchwood, MD;  Location: [facility] ORS;  Service: Orthopedics;  Laterality: Left;   TOTAL KNEE ARTHROPLASTY Right 12/15/2022   Procedure: TOTAL KNEE ARTHROPLASTY;  Surgeon: Durene Romans, MD;  Location: [facility] ORS;  Service: Orthopedics;  Laterality: Right;   TRANSESOPHAGEAL ECHOCARDIOGRAM (CATH LAB) N/A 04/12/2023  Procedure: TRANSESOPHAGEAL ECHOCARDIOGRAM;  Surgeon: Chrystie Nose, MD;  Location: [facility] INVASIVE CV LAB;  Service: Cardiovascular;  Laterality: N/A;     A IV Location/Drains/Wounds Patient Lines/Drains/Airways Status     Active Line/Drains/Airways     Name Placement date Placement time Site Days   Peripheral IV 04/24/23 22 G Left;Posterior Hand 04/24/23  1940  Hand  1   PICC Single Lumen 04/12/23 Right Brachial 38 cm 0 cm 04/12/23  1253  Brachial  13   Pressure Injury 03/18/23 Buttocks Left;Right Stage 2 -  Partial thickness loss of dermis presenting as a shallow open injury with a red, pink wound bed without slough. 03/18/23  0629  -- 38    Wound / Incision (Open or Dehisced) 03/18/23 Irritant Dermatitis (Moisture Associated Skin Damage) Abdomen Anterior;Left;Proximal;Right 03/18/23  0630  Abdomen  38   Wound / Incision (Open or Dehisced) 03/18/23 Irritant Dermatitis (Moisture Associated Skin Damage) Breast Left;Lower 03/18/23  0631  Breast  38   Wound / Incision (Open or Dehisced) 03/18/23 Irritant Dermatitis (Moisture Associated Skin Damage) Sacrum 03/18/23  0631  Sacrum  38   Wound / Incision (Open or Dehisced) 03/18/23 Skin tear Buttocks Left;Right 03/18/23  2952  Buttocks  38   Wound / Incision (Open or Dehisced) 04/10/23 Non-pressure wound Pretibial Left;Anterior Yellow; Red; Purple 04/10/23  1747  Pretibial  15            Intake/Output Last 24 hours  Intake/Output Summary (Last 24 hours) at 04/25/2023 0110 Last data filed at 04/25/2023 0043 Gross per 24 hour  Intake 1404.68 ml  Output --  Net 1404.68 ml    Labs/Imaging Results for orders placed or performed during the hospital encounter of 04/24/23 (from the past 48 hour(s))  Comprehensive metabolic panel     Status: Abnormal   Collection Time: 04/24/23  7:42 PM  Result Value Ref Range   Sodium 137 135 - 145 mmol/L   Potassium 3.5 3.5 - 5.1 mmol/L   Chloride 99 98 - 111 mmol/L   CO2 27 22 - 32 mmol/L   Glucose, Bld 130 (H) 70 - 99 mg/dL    Comment: Glucose reference range applies only to samples taken after fasting for at least 8 hours.   BUN 24 (H) 8 - 23 mg/dL   Creatinine, Ser 8.41 (H) 0.61 - 1.24 mg/dL   Calcium 8.4 (L) 8.9 - 10.3 mg/dL   Total Protein 5.4 (L) 6.5 - 8.1 g/dL   Albumin 2.4 (L) 3.5 - 5.0 g/dL   AST 17 15 - 41 U/L   ALT 15 0 - 44 U/L   Alkaline Phosphatase 74 38 - 126 U/L   Total Bilirubin 0.7 <1.2 mg/dL   GFR, Estimated 40 (L) >60 mL/min    Comment: (NOTE) Calculated using the CKD-EPI Creatinine Equation (2021)    Anion gap 11 5 - 15    Comment: Performed at [facility], [address], [place], [place] [address]  CBC  with Differential     Status: Abnormal   Collection Time: 04/24/23  7:42 PM  Result Value Ref Range   WBC 11.2 (H) 4.0 - 10.5 K/uL   RBC 3.88 (L) 4.22 - 5.81 MIL/uL   Hemoglobin 9.6 (L) 13.0 - 17.0 g/dL   HCT 10.2 (L) 72.5 - 36.6 %   MCV 82.5 80.0 - 100.0 fL   MCH 24.7 (L) 26.0 - 34.0 pg   MCHC 30.0 30.0 - 36.0 g/dL   RDW 44.0 (H) 34.7 -  15.5 %   Platelets 220 150 - 400 K/uL   nRBC 0.0 0.0 - 0.2 %   Neutrophils Relative % 87 %   Neutro Abs 9.7 (H) 1.7 - 7.7 K/uL   Lymphocytes Relative 6 %   Lymphs Abs 0.6 (L) 0.7 - 4.0 K/uL   Monocytes Relative 5 %   Monocytes Absolute 0.6 0.1 - 1.0 K/uL   Eosinophils Relative 1 %   Eosinophils Absolute 0.2 0.0 - 0.5 K/uL   Basophils Relative 0 %   Basophils Absolute 0.1 0.0 - 0.1 K/uL   Immature Granulocytes 1 %   Abs Immature Granulocytes 0.08 (H) 0.00 - 0.07 K/uL    Comment: Performed at [facility], [address] [place], [place] [address]  Protime-INR     Status: Abnormal   Collection Time: 04/24/23  7:42 PM  Result Value Ref Range   Prothrombin Time 17.7 (H) 11.4 - 15.2 seconds   INR 1.4 (H) 0.8 - 1.2    Comment: (NOTE) INR goal varies based on device and disease states. Performed at [facility], [address], [place], [place] [address]   I-Stat Lactic Acid, ED     Status: None   Collection Time: 04/24/23  7:49 PM  Result Value Ref Range   Lactic Acid, Venous 1.1 0.5 - 1.9 mmol/L  I-Stat Lactic Acid, ED     Status: None   Collection Time: 04/24/23  9:27 PM  Result Value Ref Range   Lactic Acid, Venous 1.4 0.5 - 1.9 mmol/L  Troponin I (High Sensitivity)     Status: None   Collection Time: 04/24/23 10:04 PM  Result Value Ref Range   Troponin I (High Sensitivity) 17 <18 ng/L    Comment: (NOTE) Elevated high sensitivity troponin I (hsTnI) values and significant  changes across serial measurements may suggest ACS but many other  chronic and acute conditions are known to elevate hsTnI results.  Refer to the  "Links" section for chest pain algorithms and additional  guidance. Performed at [facility], [address] [place], [place] [address]    CT Chest W Contrast  Result Date: 04/24/2023 CLINICAL DATA:  Shortness of breath EXAM: CT CHEST WITH CONTRAST TECHNIQUE: Multidetector CT imaging of the chest was performed during intravenous contrast administration. RADIATION DOSE REDUCTION: This exam was performed according to the departmental dose-optimization program which includes automated exposure control, adjustment of the mA and/or kV according to patient size and/or use of iterative reconstruction technique. CONTRAST:  60mL OMNIPAQUE IOHEXOL 350 MG/ML SOLN COMPARISON:  CT angiogram chest 09/23/2017 FINDINGS: Cardiovascular: Right upper extremity PICC terminates in the right brachiocephalic vein. Heart is mildly enlarged. There is no pericardial effusion. Aorta is normal in size. Mediastinum/Nodes: There are enlarged right hilar lymph nodes measuring up to 12 mm. Visualized esophagus and thyroid gland are within normal limits. Lungs/Pleura: Mild emphysema present. Multifocal patchy airspace and ground-glass opacities are seen throughout the right lung most prominent in the right upper lobe. There also minimal patchy ground-glass and airspace opacities in the inferior left lower lobe. There is no pleural effusion or pneumothorax. There is a 6 mm right lower lobe nodule image 6/93. There are additional 6 mm nodules in the right middle lobe image 6/88. Upper Abdomen: No acute abnormality. Musculoskeletal: Degenerative changes affect the spine. IMPRESSION: 1. Multifocal patchy airspace and ground-glass opacities throughout the right lung and inferior left lower lobe compatible with multifocal pneumonia. 2. Right hilar lymphadenopathy, likely reactive. 3. Right  lung nodules measuring up to 6 mm. Non-contrast chest CT at 3-6 months is recommended. If the nodules are stable at time of repeat CT, then future CT  at 18-24 months (from today's scan) is considered optional for low-risk patients, but is recommended for high-risk patients. This recommendation follows the consensus statement: Guidelines for Management of Incidental Pulmonary Nodules Detected on CT Images: From the Fleischner Society 2017; Radiology 2017; 284:228-243. 4. Emphysema. Emphysema (ICD10-J43.9). Electronically Signed   By: Darliss Cheney M.D.   On: 04/24/2023 23:32   CT Head Wo Contrast  Result Date: 04/24/2023 CLINICAL DATA:  Trauma EXAM: CT HEAD WITHOUT CONTRAST TECHNIQUE: Contiguous axial images were obtained from the base of the skull through the vertex without intravenous contrast. RADIATION DOSE REDUCTION: This exam was performed according to the departmental dose-optimization program which includes automated exposure control, adjustment of the mA and/or kV according to patient size and/or use of iterative reconstruction technique. COMPARISON:  Head CT 04/06/2023 FINDINGS: Brain: No evidence of acute infarction, hemorrhage, hydrocephalus, extra-axial collection or mass lesion/mass effect. Again seen is mild diffuse atrophy and mild periventricular white matter hypodensity, likely chronic small vessel ischemic change. Vascular: Atherosclerotic calcifications are present within the cavernous internal carotid arteries. Skull: Normal. Negative for fracture or focal lesion. Sinuses/Orbits: No acute finding. Other: None. IMPRESSION: 1. No acute intracranial process. 2. Mild diffuse atrophy and mild chronic small vessel ischemic change. Electronically Signed   By: Darliss Cheney M.D.   On: 04/24/2023 21:27   DG Chest Portable 1 View  Result Date: 04/24/2023 CLINICAL DATA:  new oxygen dependent EXAM: PORTABLE CHEST 1 VIEW COMPARISON:  Chest x-ray 04/06/2023, chest x-ray 09/23/2017, chest x-ray 08/18/2022 FINDINGS: Right PICC with tip overlying the proximal superior vena cava. The heart and mediastinal contours are within normal limits. Low lung  volumes. Interval worsening of right middle lobe airspace opacity. Persistent bilateral lower lobe airspace opacities. No focal consolidation. No pulmonary edema. Possible trace right pleural effusion. No pneumothorax. No acute osseous abnormality. IMPRESSION: 1. Persistent bilateral lower lobe airspace opacities. Interval worsening of right middle lobe airspace opacity. Findings could represent infection versus inflammation versus atelectasis versus malignancy. Recommend CT chest with intravenous contrast (not CTPA) for further evaluation. 2. Possible trace right pleural effusion. 3. Right PICC with tip overlying the proximal superior vena cava. Electronically Signed   By: Tish Frederickson M.D.   On: 04/24/2023 21:23    Pending Labs Unresulted Labs (From admission, onward)     Start     Ordered   04/25/23 0500  CBC  Tomorrow morning,   R        04/24/23 2346   04/25/23 0500  Basic metabolic panel  Tomorrow morning,   R        04/24/23 2346   04/24/23 1952  Brain natriuretic peptide  Once,   URGENT        04/24/23 1951   04/24/23 1943  Urinalysis, w/ Reflex to Culture (Infection Suspected) -Urine, Clean Catch  Once,   URGENT       Question:  Specimen Source  Answer:  Urine, Clean Catch   04/24/23 1942   04/24/23 1932  Culture, blood (Routine x 2)  BLOOD CULTURE X 2,   R (with STAT occurrences)      04/24/23 1932            Vitals/Pain Today's Vitals   04/24/23 2345 04/25/23 0000 04/25/23 0015 04/25/23 0044  BP:  (!) 135/59  (!) 130/55  Pulse: 82  78 77 76  Resp: 18   20  Temp:    98.3 F (36.8 C)  TempSrc:    Oral  SpO2: 99% 99% 99% 98%  Weight:      Height:      PainSc:        Isolation Precautions No active isolations  Medications Medications  carvedilol (COREG) tablet 6.25 mg (has no administration in time range)  torsemide (DEMADEX) tablet 20 mg (has no administration in time range)  buPROPion (WELLBUTRIN SR) 12 hr tablet 150 mg (has no administration in time range)   nortriptyline (PAMELOR) capsule 20 mg (20 mg Oral Given 04/25/23 0059)  pantoprazole (PROTONIX) EC tablet 40 mg (has no administration in time range)  polyethylene glycol (MIRALAX / GLYCOLAX) packet 17 g (has no administration in time range)  senna (SENOKOT) tablet 17.2 mg (17.2 mg Oral Given 04/25/23 0058)  finasteride (PROSCAR) tablet 5 mg (5 mg Oral Given 04/25/23 0058)  apixaban (ELIQUIS) tablet 5 mg (5 mg Oral Given 04/25/23 0059)  gabapentin (NEURONTIN) capsule 600 mg (has no administration in time range)  loratadine (CLARITIN) tablet 10 mg (has no administration in time range)  vancomycin (VANCOREADY) IVPB 750 mg/150 mL (has no administration in time range)  ceFEPIme (MAXIPIME) 2 g in sodium chloride 0.9 % 100 mL IVPB (has no administration in time range)  acetaminophen (TYLENOL) tablet 650 mg (650 mg Oral Given 04/24/23 1951)  metroNIDAZOLE (FLAGYL) IVPB 500 mg (0 mg Intravenous Stopped 04/24/23 2314)  lactated ringers bolus 1,000 mL (0 mLs Intravenous Stopped 04/25/23 0017)  ceFEPIme (MAXIPIME) 2 g in sodium chloride 0.9 % 100 mL IVPB (0 g Intravenous Stopped 04/24/23 2158)  vancomycin (VANCOREADY) IVPB 2000 mg/400 mL (0 mg Intravenous Stopped 04/25/23 0043)  iohexol (OMNIPAQUE) 350 MG/ML injection 60 mL (60 mLs Intravenous Contrast Given 04/24/23 2302)    Mobility walks with device     Focused Assessments Pulmonary Assessment Handoff:  Lung sounds: Bilateral Breath Sounds: Diminished L Breath Sounds: Diminished R Breath Sounds: Diminished O2 Device: Nasal Cannula O2 Flow Rate (L/min): 4 L/min    R Recommendations: See Admitting Provider Note  Report given to:   Additional Notes: Patient from [facility]

## 2023-04-26 ENCOUNTER — Other Ambulatory Visit: Payer: Self-pay

## 2023-04-26 DIAGNOSIS — B9562 Methicillin resistant Staphylococcus aureus infection as the cause of diseases classified elsewhere: Secondary | ICD-10-CM

## 2023-04-26 LAB — CBC
HCT: 30.2 % — ABNORMAL LOW (ref 39.0–52.0)
Hemoglobin: 9 g/dL — ABNORMAL LOW (ref 13.0–17.0)
MCH: 24.4 pg — ABNORMAL LOW (ref 26.0–34.0)
MCHC: 29.8 g/dL — ABNORMAL LOW (ref 30.0–36.0)
MCV: 81.8 fL (ref 80.0–100.0)
Platelets: 213 10*3/uL (ref 150–400)
RBC: 3.69 MIL/uL — ABNORMAL LOW (ref 4.22–5.81)
RDW: 19.3 % — ABNORMAL HIGH (ref 11.5–15.5)
WBC: 8.6 10*3/uL (ref 4.0–10.5)
nRBC: 0 % (ref 0.0–0.2)

## 2023-04-26 LAB — BASIC METABOLIC PANEL
Anion gap: 10 (ref 5–15)
BUN: 23 mg/dL (ref 8–23)
CO2: 27 mmol/L (ref 22–32)
Calcium: 8.4 mg/dL — ABNORMAL LOW (ref 8.9–10.3)
Chloride: 100 mmol/L (ref 98–111)
Creatinine, Ser: 1.59 mg/dL — ABNORMAL HIGH (ref 0.61–1.24)
GFR, Estimated: 44 mL/min — ABNORMAL LOW (ref >60)
Glucose, Bld: 95 mg/dL (ref 70–99)
Potassium: 3.4 mmol/L — ABNORMAL LOW (ref 3.5–5.1)
Sodium: 137 mmol/L (ref 135–145)

## 2023-04-26 LAB — MAGNESIUM: Magnesium: 1.9 mg/dL (ref 1.7–2.4)

## 2023-04-26 LAB — PHOSPHORUS: Phosphorus: 3.4 mg/dL (ref 2.5–4.6)

## 2023-04-26 MED ORDER — DIPHENHYDRAMINE HCL 25 MG PO CAPS
25.0000 mg | ORAL_CAPSULE | Freq: Four times a day (QID) | ORAL | Status: DC | PRN
  Administered 2023-04-26 – 2023-05-05 (×17): 25 mg via ORAL
  Filled 2023-04-26 (×18): qty 1

## 2023-04-26 MED ORDER — POTASSIUM CHLORIDE 20 MEQ PO PACK
40.0000 meq | PACK | Freq: Once | ORAL | Status: AC
  Administered 2023-04-26: 40 meq via ORAL
  Filled 2023-04-26: qty 2

## 2023-04-26 NOTE — Consult Note (Signed)
WOC Nurse Consult Note: Reason for Consult: Request a consult for LLE.  Wound type: Full thickness chronic in the middle of the leg, in the tibia bone. Measurement: 3X3 Wound bed: dark red, scar in the middle with red edges. Drainage moderate, yellow draining Dressing procedure/placement/frequency: Topical treatment orders provided for bedside nurses to perform as follows: 1. Cut piece of Aquacel [place] [phone]) and apply to left leg wound Q Mon and Thurs.  Cover with foam dressing.  Change foam dressing Q 3 days or PRN soiling. Moisten Aquacel with NS to assist with removal each time.  2. Foam dressing to left upper wound, change Q 3 days or PRN soiling.  Please re-consult if further assistance is needed.  Thank-you,  Cammie Mcgee MSN, RN, CWOCN, [facility], CNS [phone]

## 2023-04-26 NOTE — Progress Notes (Signed)
Pharmacy Antibiotic Note  Tanner [name] is a 77 y.o. male admitted on 04/24/2023 with altered mental status and hypoxia with multifocal pneumonia.  Pharmacy has been consulted for cefepime and vancomycin dosing.  PMH includes recent hospitalization for prosthetic knee infection with MRSA/enterococcus faecalis bacteremia s/p irrigation/debridement with poly exchange on 04/07/23. Patient previously on daptomycin until 05/19/2023 then to be converted to doxycycline 100mg  BID for suppressive therapy   WBC 11.2, sCr 1.72 >> 1.59, lactate 1.4, Tmax 101 Blood cultures collected Given 1 dose of vancomycin 2g IV, cefepime 2g, and flagyl 500mg  IV in the ED  Plan: -Cefepime 2g IV every 12 hours -adjust Vancomycin to 1000 mg IV every 24 hours (AUC 518, adjBW, Vd 0.5) -Monitor renal function -Follow up signs of clinical improvement, LOT, de-escalation of antibiotics   Height: 5\' 4"  (162.6 cm) Weight: 100.4 kg (221 lb 5.5 oz) IBW/kg (Calculated) : 59.2  Temp (24hrs), Avg:98.2 F (36.8 C), Min:97.7 F (36.5 C), Max:98.6 F (37 C)  Recent Labs  Lab 04/24/23 1942 04/24/23 1949 04/24/23 2127 04/25/23 0305 04/26/23 0406  WBC 11.2*  --   --  9.2 8.6  CREATININE 1.72*  --   --  1.64* 1.59*  LATICACIDVEN  --  1.1 1.4  --   --     Estimated Creatinine Clearance: 41 mL/min (A) (by C-G formula based on SCr of 1.59 mg/dL (H)).    Allergies  Allergen Reactions   Lisinopril Other (See Comments)     Renal impairment   Amlodipine Swelling   Codeine Sulfate Itching and Nausea Only    Antimicrobials this admission: Cefepime 11/9 >>  Flagyl 11/9 >>  Vancomycin 11/9 >>  Microbiology results: 10/24 bcx ngtdF 10/23 bcx MRSA 10/22 bcx e faecalis + MRSA, BCID w/ strep species (no vancomycin resistance) 10/22 knee cx MRSA 11/9 BCx: ngtd2  Thank you for allowing pharmacy to be a part of this patient's care.  Greta Doom BS, PharmD, BCPS Clinical Pharmacist 04/26/2023 7:53 AM  Contact:  [phone] after 3 PM  "Be curious, not judgmental..." -Debbora Dus

## 2023-04-26 NOTE — Progress Notes (Signed)
[facility]  Date of Admission:  04/24/2023     Total days of antibiotics 20         ASSESSMENT:  Tanner [name]'s blood cultures have remained clear in the setting of MRSA right prosthetic joint infection complicated by suspected multifocal pneumonia which may be related to daptomycin. Tolerating vancomycin and feeling better. Appears to have a dermatitis under his right PICC line. Will remove PICC line and change to left side. Surgical incision appears without infection. Monitor vancomycin levels and renal function for therapeutic drug monitoring. WIll continue previous plan for right prosthetic knee infection with IV end date of 05/19/23. Remaining medical and supportive care per Internal Medicine.   PLAN:  Continue current dose of vancomycin and cefepime.  Monitor cultures for bacteremia.  Continue post-operative wound care.  Change PICC to left side. Therapeutic drug monitoring of renal function and vancomycin levels.  Remaining medical and supportive care per Internal Medicine.    I have personally spent 27 minutes involved in face-to-face and non-face-to-face activities for this patient on the day of the visit. Professional time spent includes the following activities: Preparing to see the patient (review of tests), Obtaining and/or reviewing separately obtained history (admission/discharge record), Performing a medically appropriate examination and/or evaluation , Ordering medications/tests/procedures, referring and communicating with other health care professionals, Documenting clinical information in the EMR, Independently interpreting results (not separately reported), Communicating results to the patient/family/caregiver, Counseling and educating the patient/family/caregiver and Care coordination (not separately reported).   Principal Problem:   Acute hypoxic respiratory failure (HCC) Active Problems:   Essential hypertension   History of pulmonary  embolus (PE)   Morbid obesity (HCC)   DM2 (diabetes mellitus, type 2) (HCC)   Multifocal pneumonia   Chronic systolic CHF (congestive heart failure) (HCC)   Sepsis (HCC)   Acute metabolic encephalopathy   History of MRSA infection   Lung nodule    apixaban  5 mg Oral BID   buPROPion  150 mg Oral BID   carvedilol  6.25 mg Oral BID WC   Chlorhexidine Gluconate Cloth  6 each Topical Daily   finasteride  5 mg Oral QHS   gabapentin  600 mg Oral TID   loratadine  10 mg Oral Daily   nortriptyline  20 mg Oral QHS   pantoprazole  40 mg Oral Q1200   polyethylene glycol  17 g Oral Daily   senna  2 tablet Oral QHS   sodium chloride flush  10-40 mL Intracatheter Q12H   torsemide  20 mg Oral Daily    SUBJECTIVE:  Afebrile overnight with with no acute events. Feeling better.   Allergies  Allergen Reactions   Lisinopril Other (See Comments)     Renal impairment   Amlodipine Swelling   Codeine Sulfate Itching and Nausea Only     Review of Systems: Review of Systems  Constitutional:  Negative for chills, fever and weight loss.  Respiratory:  Negative for cough, shortness of breath and wheezing.   Cardiovascular:  Negative for chest pain and leg swelling.  Gastrointestinal:  Negative for abdominal pain, constipation, diarrhea, nausea and vomiting.  Skin:  Negative for rash.      OBJECTIVE: Vitals:   04/25/23 1946 04/26/23 0041 04/26/23 0752 04/26/23 0945  BP: (!) 135/56 (!) 143/70 (!) 134/56 117/82  Pulse: 82 87 80 90  Resp: 18 18 17    Temp: 98.6 F (37 C) 98.4 F (36.9 C) 97.7 F (36.5 C)   TempSrc:  Oral Oral   SpO2: 91% 92% 91%   Weight:      Height:       Body mass index is 37.99 kg/m.  Physical Exam Constitutional:      General: He is not in acute distress.    Appearance: He is well-developed.  Cardiovascular:     Rate and Rhythm: Normal rate and regular rhythm.     Heart sounds: Normal heart sounds.  Pulmonary:     Effort: Pulmonary effort is normal.      Breath sounds: Normal breath sounds.  Skin:    General: Skin is warm and dry.  Neurological:     Mental Status: He is alert and oriented to person, place, and time.            Lab Results Lab Results  Component Value Date   WBC 8.6 04/26/2023   HGB 9.0 (L) 04/26/2023   HCT 30.2 (L) 04/26/2023   MCV 81.8 04/26/2023   PLT 213 04/26/2023    Lab Results  Component Value Date   CREATININE 1.59 (H) 04/26/2023   BUN 23 04/26/2023   NA 137 04/26/2023   K 3.4 (L) 04/26/2023   CL 100 04/26/2023   CO2 27 04/26/2023    Lab Results  Component Value Date   ALT 15 04/24/2023   AST 17 04/24/2023   ALKPHOS 74 04/24/2023   BILITOT 0.7 04/24/2023     Microbiology: Recent Results (from the past 240 hour(s))  Culture, blood (Routine x 2)     Status: None (Preliminary result)   Collection Time: 04/24/23  8:33 PM   Specimen: BLOOD LEFT HAND  Result Value Ref Range Status   Specimen Description BLOOD LEFT HAND  Final   Special Requests   Final    BOTTLES DRAWN AEROBIC AND ANAEROBIC Blood Culture adequate volume   Culture   Final    NO GROWTH 2 DAYS Performed at [facility], [address], [place], [place] [address]    Report Status PENDING  Incomplete  Culture, blood (Routine x 2)     Status: None (Preliminary result)   Collection Time: 04/24/23  9:10 PM   Specimen: BLOOD  Result Value Ref Range Status   Specimen Description BLOOD LEFT ANTECUBITAL  Final   Special Requests   Final    BOTTLES DRAWN AEROBIC AND ANAEROBIC Blood Culture adequate volume   Culture   Final    NO GROWTH 2 DAYS Performed at [facility], [address] [place], [place] [address]    Report Status PENDING  Incomplete     Marcos Eke, NP [facility]  04/26/2023  11:06 AM

## 2023-04-26 NOTE — Progress Notes (Signed)
Mobility Specialist Progress Note:   04/26/23 0839  Mobility  Activity Ambulated with assistance in room;Transferred from bed to chair  Level of Assistance Contact guard assist, steadying assist  Assistive Device Front wheel walker  Distance Ambulated (ft) 25 ft  RLE Weight Bearing WBAT  Activity Response Tolerated well  Mobility Referral Yes  $Mobility charge 1 Mobility  Mobility Specialist Start Time (ACUTE ONLY) [id number]  Mobility Specialist Stop Time (ACUTE ONLY) 0847  Mobility Specialist Time Calculation (min) (ACUTE ONLY) 8 min   Pt received in bed, agreeable to mobility session. Ambulated in room with RW. Tolerated well, asx throughout. Agreeable to sit up in chair after session, call bell in reach, all needs met.   Feliciana Rossetti Mobility Specialist Please contact via Special educational needs teacher or  Rehab office at [phone]

## 2023-04-26 NOTE — Progress Notes (Signed)
Peripherally Inserted Central Catheter Placement  The IV Nurse has discussed with the patient and/or persons authorized to consent for the patient, the purpose of this procedure and the potential benefits and risks involved with this procedure.  The benefits include less needle sticks, lab draws from the catheter, and the patient may be discharged home with the catheter. Risks include, but not limited to, infection, bleeding, blood clot (thrombus formation), and puncture of an artery; nerve damage and irregular heartbeat and possibility to perform a PICC exchange if needed/ordered by physician.  Alternatives to this procedure were also discussed.  Bard Power PICC patient education guide, fact sheet on infection prevention and patient information card has been provided to patient /or left at bedside.    PICC Placement Documentation  PICC Single Lumen 04/26/23 Left Basilic 46 cm 0 cm (Active)  Indication for Insertion or Continuance of Line Prolonged intravenous therapies 04/26/23 1325  Exposed Catheter (cm) 0 cm 04/26/23 1325  Site Assessment Clean, Dry, Intact 04/26/23 1325  Line Status Flushed;Saline locked;Blood return noted 04/26/23 1325  Dressing Type Transparent;Securing device 04/26/23 1325  Dressing Status Antimicrobial disc in place;Clean, Dry, Intact 04/26/23 1325  Line Care Connections checked and tightened 04/26/23 1325  Dressing Intervention New dressing 04/26/23 1325  Dressing Change Due 05/03/23 04/26/23 1325       Tanner [name]  Fredric Mare 04/26/2023, 1:27 PM

## 2023-04-26 NOTE — Progress Notes (Signed)
Right arm PICC removed per order to be replaced in left arm. Post removal instructions discussed with patient, verbalizes understanding. Dressing remains clean/dry/intact at this time.

## 2023-04-26 NOTE — Plan of Care (Signed)
  Problem: Coping: Goal: Level of anxiety will decrease Outcome: Progressing   Problem: Elimination: Goal: Will not experience complications related to bowel motility Outcome: Progressing   Problem: Pain Management: Goal: General experience of comfort will improve Outcome: Progressing   Problem: Safety: Goal: Ability to remain free from injury will improve Outcome: Progressing

## 2023-04-26 NOTE — Progress Notes (Signed)
PROGRESS NOTE    MCKINNEY POPLAR  ZOX:[id number] DOB: October 07, 1945 DOA: 04/24/2023 PCP: [facility]   Brief Narrative:  This 77 y.o. male with medical history significant for chronic systolic heart failure, paroxysmal atrial fibrillation, history of PE on Eliquis, type 2 diabetes, C. difficile, CKD 3B, morbid obesity, recent bacteremia who presents with altered mental status and hypoxia. Patient was recently hospitalized from 10/22 to 10/30 for prosthetic right knee infection with MRSA/Enterococcus faecalis bacteremia s/p irrigation/debridement with poly exchange on 10/23.  He was followed by infectious disease and orthopedic.  He is to remain on IV daptomycin till 12/4 and then Doxy 100 mg twice daily indefinitely for suppressive therapy. He was found to be febrile with a temp of 101, hypoxia with SpO2 87% on 4 L.  Chest x-ray shows persistent bilateral lower lobe airspace opacities.  Findings could represent infection versus inflammation versus malignancy or atelectasis.  Patient is admitted for further evaluation  Assessment & Plan:   Principal Problem:   Acute hypoxic respiratory failure (HCC) Active Problems:   Sepsis (HCC)   History of MRSA infection   Essential hypertension   History of pulmonary embolus (PE)   Morbid obesity (HCC)   DM2 (diabetes mellitus, type 2) (HCC)   Multifocal pneumonia   Chronic systolic CHF (congestive heart failure) (HCC)   Acute metabolic encephalopathy   Lung nodule  Acute hypoxic respiratory failure: Multifocal pneumonia: Likely secondary to multifocal pneumonia requiring 3 L of supplemental oxygen. Previously admitted for pneumonia with concerns for aspiration.  Continue dysphagia 3 diet. Empirically started on IV vancomycin, cefepime. De-escalate antibiotic as able to.   Sepsis [facility]): He presented with fever, tachycardia and leukocytosis with finding of multifocal pneumonia on CT chest Continue IV antibiotics   History of MRSA  infection: Patient was recently hospitalized from 10/22 to 10/30 for prosthetic right knee infection with MRSA/Enterococcus faecalis bacteremia s/p irrigation/debridement with poly exchange on 10/23.  He was followed by infectious disease and orthopedics.  He is to remain on IV daptomycin till 12/4 and then Doxy 100 mg twice daily indefinitely for suppressive therapy. Discussed with inpatient pharmacy and okay to continue on IV vancomycin.  Infectious disease consulted, changed PICC line from right side to left side. Continue Vanco and cefepime for now.   Essential hypertension: Controlled. Continue Coreg, torsemide.   History of pulmonary embolus (PE) Continue Eliquis.   Lung nodule: Right lung nodules measuring up to 6 mm.  Non-contrast chest CT at 3-6 months is recommended. If the nodules are stable at time of repeat CT, then future CT at 18-24 months.   Acute metabolic encephalopathy: > Resolved. Likely sec. to sepsis and acute hypoxic respiratory failure secondary to multifocal pneumonia  Continue IV antibiotics. Patient appears back to his baseline mental status.   Chronic systolic CHF (congestive heart failure) (HCC) He is found to have  Bilateral LE edema on exam Continue Torsemide.   DM2 (diabetes mellitus, type 2) (HCC) Controlled. A1C of 5.3 last month    Morbid obesity (HCC) BMI of 42. Diet and exercise discussed in detail.   DVT prophylaxis:Eliquis Code Status:Full code Family Communication: No family at bed side Disposition Plan:     Status is: Inpatient Remains inpatient appropriate because: Admitted for acute hypoxic respiratory failure likely due to pneumonia. Infectious diseases consulted.  Consultants:  Infectious diseases.  Procedures:  Antimicrobials:  Anti-infectives (From admission, onward)    Start     Dose/Rate Route Frequency Ordered Stop   04/25/23 2000  vancomycin (  VANCOREADY) IVPB 750 mg/150 mL        750 mg 150 mL/hr over 60 Minutes  Intravenous Every 24 hours 04/25/23 0033     04/25/23 0800  ceFEPIme (MAXIPIME) 2 g in sodium chloride 0.9 % 100 mL IVPB        2 g 200 mL/hr over 30 Minutes Intravenous Every 12 hours 04/25/23 0033     04/24/23 2000  metroNIDAZOLE (FLAGYL) IVPB 500 mg        500 mg 100 mL/hr over 60 Minutes Intravenous  Once 04/24/23 1950 04/24/23 2314   04/24/23 2000  ceFEPIme (MAXIPIME) 2 g in sodium chloride 0.9 % 100 mL IVPB        2 g 200 mL/hr over 30 Minutes Intravenous  Once 04/24/23 1956 04/24/23 2158   04/24/23 2000  vancomycin (VANCOREADY) IVPB 2000 mg/400 mL        2,000 mg 200 mL/hr over 120 Minutes Intravenous  Once 04/24/23 1956 04/25/23 0043      Subjective: Patient was seen and examined at bedside. Overnight events noted.   Patient reports doing much better.  He was sitting comfortably on the chair watching television.   He reports severe itching in his body taking Benadryl as needed.  Objective: Vitals:   04/25/23 1946 04/26/23 0041 04/26/23 0752 04/26/23 0945  BP: (!) 135/56 (!) 143/70 (!) 134/56 117/82  Pulse: 82 87 80 90  Resp: 18 18 17    Temp: 98.6 F (37 C) 98.4 F (36.9 C) 97.7 F (36.5 C)   TempSrc:  Oral Oral   SpO2: 91% 92% 91%   Weight:      Height:        Intake/Output Summary (Last 24 hours) at 04/26/2023 1410 Last data filed at 04/26/2023 0351 Gross per 24 hour  Intake 350.89 ml  Output 700 ml  Net -349.11 ml   Filed Weights   04/24/23 2122 04/25/23 0210  Weight: 111 kg 100.4 kg    Examination:  General exam: Appears comfortable, not in any distress.  Deconditioned Respiratory system: CTA bilaterally. Respiratory effort normal.  RR 14 Cardiovascular system: S1 & S2 heard, RRR. No JVD, murmurs, rubs, gallops or clicks. No pedal edema. Gastrointestinal system: Abdomen is non distended, soft and non tender.  Normal bowel sounds heard. Central nervous system: Alert and oriented x 3. No focal neurological deficits. Extremities: Right Knee incision  noted, not infected. Skin: No rashes, lesions or ulcers Psychiatry: Judgement and insight appear normal. Mood & affect appropriate.     Data Reviewed: I have personally reviewed following labs and imaging studies  CBC: Recent Labs  Lab 04/24/23 1942 04/25/23 0305 04/26/23 0406  WBC 11.2* 9.2 8.6  NEUTROABS 9.7*  --   --   HGB 9.6* 8.6* 9.0*  HCT 32.0* 29.2* 30.2*  MCV 82.5 83.7 81.8  PLT 220 201 213   Basic Metabolic Panel: Recent Labs  Lab 04/24/23 1942 04/25/23 0305 04/26/23 0406  NA 137 139 137  K 3.5 3.5 3.4*  CL 99 101 100  CO2 27 29 27   GLUCOSE 130* 110* 95  BUN 24* 22 23  CREATININE 1.72* 1.64* 1.59*  CALCIUM 8.4* 8.1* 8.4*  MG  --   --  1.9  PHOS  --   --  3.4   GFR: Estimated Creatinine Clearance: 41 mL/min (A) (by C-G formula based on SCr of 1.59 mg/dL (H)). Liver Function Tests: Recent Labs  Lab 04/24/23 1942  AST 17  ALT 15  ALKPHOS 74  BILITOT 0.7  PROT 5.4*  ALBUMIN 2.4*   No results for input(s): "LIPASE", "AMYLASE" in the last 168 hours. No results for input(s): "AMMONIA" in the last 168 hours. Coagulation Profile: Recent Labs  Lab 04/24/23 1942  INR 1.4*   Cardiac Enzymes: No results for input(s): "CKTOTAL", "CKMB", "CKMBINDEX", "TROPONINI" in the last 168 hours. BNP (last 3 results) No results for input(s): "PROBNP" in the last 8760 hours. HbA1C: No results for input(s): "HGBA1C" in the last 72 hours. CBG: No results for input(s): "GLUCAP" in the last 168 hours. Lipid Profile: No results for input(s): "CHOL", "HDL", "LDLCALC", "TRIG", "CHOLHDL", "LDLDIRECT" in the last 72 hours. Thyroid Function Tests: No results for input(s): "TSH", "T4TOTAL", "FREET4", "T3FREE", "THYROIDAB" in the last 72 hours. Anemia Panel: No results for input(s): "VITAMINB12", "FOLATE", "FERRITIN", "TIBC", "IRON", "RETICCTPCT" in the last 72 hours. Sepsis Labs: Recent Labs  Lab 04/24/23 1949 04/24/23 2127  LATICACIDVEN 1.1 1.4    Recent Results  (from the past 240 hour(s))  Culture, blood (Routine x 2)     Status: None (Preliminary result)   Collection Time: 04/24/23  8:33 PM   Specimen: BLOOD LEFT HAND  Result Value Ref Range Status   Specimen Description BLOOD LEFT HAND  Final   Special Requests   Final    BOTTLES DRAWN AEROBIC AND ANAEROBIC Blood Culture adequate volume   Culture   Final    NO GROWTH 2 DAYS Performed at [facility], [address], [place], [place] [address]    Report Status PENDING  Incomplete  Culture, blood (Routine x 2)     Status: None (Preliminary result)   Collection Time: 04/24/23  9:10 PM   Specimen: BLOOD  Result Value Ref Range Status   Specimen Description BLOOD LEFT ANTECUBITAL  Final   Special Requests   Final    BOTTLES DRAWN AEROBIC AND ANAEROBIC Blood Culture adequate volume   Culture   Final    NO GROWTH 2 DAYS Performed at [facility], [address], [place], [place] [address]    Report Status PENDING  Incomplete    Radiology Studies: [name] EKG SITE RITE  Result Date: 04/26/2023 If Site Rite image not attached, placement could not be confirmed due to current cardiac rhythm.  CT Chest W Contrast  Result Date: 04/24/2023 CLINICAL DATA:  Shortness of breath EXAM: CT CHEST WITH CONTRAST TECHNIQUE: Multidetector CT imaging of the chest was performed during intravenous contrast administration. RADIATION DOSE REDUCTION: This exam was performed according to the departmental dose-optimization program which includes automated exposure control, adjustment of the mA and/or kV according to patient size and/or use of iterative reconstruction technique. CONTRAST:  60mL OMNIPAQUE IOHEXOL 350 MG/ML SOLN COMPARISON:  CT angiogram chest 09/23/2017 FINDINGS: Cardiovascular: Right upper extremity PICC terminates in the right brachiocephalic vein. Heart is mildly enlarged. There is no pericardial effusion. Aorta is normal in size. Mediastinum/Nodes: There are enlarged right hilar lymph nodes  measuring up to 12 mm. Visualized esophagus and thyroid gland are within normal limits. Lungs/Pleura: Mild emphysema present. Multifocal patchy airspace and ground-glass opacities are seen throughout the right lung most prominent in the right upper lobe. There also minimal patchy ground-glass and airspace opacities in the inferior left lower lobe. There is no pleural effusion or pneumothorax. There is a 6 mm right lower lobe nodule image 6/93. There are additional 6 mm nodules in the right middle lobe image 6/88. Upper Abdomen: No acute abnormality. Musculoskeletal: Degenerative changes affect the spine. IMPRESSION:  1. Multifocal patchy airspace and ground-glass opacities throughout the right lung and inferior left lower lobe compatible with multifocal pneumonia. 2. Right hilar lymphadenopathy, likely reactive. 3. Right lung nodules measuring up to 6 mm. Non-contrast chest CT at 3-6 months is recommended. If the nodules are stable at time of repeat CT, then future CT at 18-24 months (from today's scan) is considered optional for low-risk patients, but is recommended for high-risk patients. This recommendation follows the consensus statement: Guidelines for Management of Incidental Pulmonary Nodules Detected on CT Images: From the Fleischner Society 2017; Radiology 2017; 284:228-243. 4. Emphysema. Emphysema (ICD10-J43.9). Electronically Signed   By: Darliss Cheney M.D.   On: 04/24/2023 23:32   CT Head Wo Contrast  Result Date: 04/24/2023 CLINICAL DATA:  Trauma EXAM: CT HEAD WITHOUT CONTRAST TECHNIQUE: Contiguous axial images were obtained from the base of the skull through the vertex without intravenous contrast. RADIATION DOSE REDUCTION: This exam was performed according to the departmental dose-optimization program which includes automated exposure control, adjustment of the mA and/or kV according to patient size and/or use of iterative reconstruction technique. COMPARISON:  Head CT 04/06/2023 FINDINGS: Brain:  No evidence of acute infarction, hemorrhage, hydrocephalus, extra-axial collection or mass lesion/mass effect. Again seen is mild diffuse atrophy and mild periventricular white matter hypodensity, likely chronic small vessel ischemic change. Vascular: Atherosclerotic calcifications are present within the cavernous internal carotid arteries. Skull: Normal. Negative for fracture or focal lesion. Sinuses/Orbits: No acute finding. Other: None. IMPRESSION: 1. No acute intracranial process. 2. Mild diffuse atrophy and mild chronic small vessel ischemic change. Electronically Signed   By: Darliss Cheney M.D.   On: 04/24/2023 21:27   DG Chest Portable 1 View  Result Date: 04/24/2023 CLINICAL DATA:  new oxygen dependent EXAM: PORTABLE CHEST 1 VIEW COMPARISON:  Chest x-ray 04/06/2023, chest x-ray 09/23/2017, chest x-ray 08/18/2022 FINDINGS: Right PICC with tip overlying the proximal superior vena cava. The heart and mediastinal contours are within normal limits. Low lung volumes. Interval worsening of right middle lobe airspace opacity. Persistent bilateral lower lobe airspace opacities. No focal consolidation. No pulmonary edema. Possible trace right pleural effusion. No pneumothorax. No acute osseous abnormality. IMPRESSION: 1. Persistent bilateral lower lobe airspace opacities. Interval worsening of right middle lobe airspace opacity. Findings could represent infection versus inflammation versus atelectasis versus malignancy. Recommend CT chest with intravenous contrast (not CTPA) for further evaluation. 2. Possible trace right pleural effusion. 3. Right PICC with tip overlying the proximal superior vena cava. Electronically Signed   By: Tish Frederickson M.D.   On: 04/24/2023 21:23     Scheduled Meds:  apixaban  5 mg Oral BID   buPROPion  150 mg Oral BID   carvedilol  6.25 mg Oral BID WC   Chlorhexidine Gluconate Cloth  6 each Topical Daily   finasteride  5 mg Oral QHS   gabapentin  600 mg Oral TID   loratadine   10 mg Oral Daily   nortriptyline  20 mg Oral QHS   pantoprazole  40 mg Oral Q1200   polyethylene glycol  17 g Oral Daily   senna  2 tablet Oral QHS   sodium chloride flush  10-40 mL Intracatheter Q12H   torsemide  20 mg Oral Daily   Continuous Infusions:  ceFEPime (MAXIPIME) IV 2 g (04/25/23 2023)   vancomycin 750 mg (04/25/23 2027)     LOS: 2 days    Time spent: 35 mins    Willeen Niece, MD [facility]   If 7PM-7AM, please  contact night-coverage

## 2023-04-26 NOTE — TOC Initial Note (Signed)
Transition of Care [facility]) - Initial/Assessment Note    Patient Details  Name: Tanner [name] MRN: [id number] Date of Birth: Sep 11, 1945  Transition [facility]) CM/SW Contact:    Saman Umstead A [place], Theresia Majors Phone Number: 04/26/2023, 4:13 PM  Clinical Narrative:                  Update 11/14 1345  Pt can return to [facility] when stable. Authorization is needed to return, CSW to start authorization once nearing medical stability.   CSW contacted pt via phone. He informed CSW that he was from SNF at [facility] for short term rehab. He informed CSW that he would be ok with returning. Pt not yet medically stable. CSW to reached out to  [employer] to confirm pt can return once medically stable and need for insurance authorization.   TOC will continue to follow.  Expected Discharge Plan: Skilled Nursing Facility Barriers to Discharge: Continued Medical Work up, English as a second language teacher   Patient Goals and CMS Choice            Expected Discharge Plan and Services                                              Prior Living Arrangements/Services              Need for Family Participation in Patient Care: Yes (Comment) Care giver support system in place?: Yes (comment)      Activities of Daily Living   ADL Screening (condition at time of admission) Independently performs ADLs?: No Does the patient have a NEW difficulty with bathing/dressing/toileting/self-feeding that is expected to last >3 days?: No Does the patient have a NEW difficulty with getting in/out of bed, walking, or climbing stairs that is expected to last >3 days?: No Does the patient have a NEW difficulty with communication that is expected to last >3 days?: No Is the patient deaf or have difficulty hearing?: No Does the patient have difficulty seeing, even when wearing glasses/contacts?: No Does the patient have difficulty concentrating, remembering, or making decisions?: No  Permission  Sought/Granted                  Emotional Assessment Appearance:: Appears stated age Attitude/Demeanor/Rapport:  (normal) Affect (typically observed): Appropriate Orientation: : Oriented to Self, Oriented to Place, Oriented to Situation, Oriented to  Time Alcohol / Substance Use: Not Applicable Psych Involvement: No (comment)  Admission diagnosis:  Sepsis (HCC) [A41.9] Patient Active Problem List   Diagnosis Date Noted   Acute metabolic encephalopathy 04/24/2023   History of MRSA infection 04/24/2023   Lung nodule 04/24/2023   Generalized weakness 04/07/2023   Fall at home, initial encounter 04/07/2023   Acute on chronic anemia 04/07/2023   Hyperlipidemia 04/07/2023   MRSA bacteremia 04/07/2023   Enterococcal bacteremia 04/07/2023   Streptococcal bacteremia 04/07/2023   Infection of prosthetic right knee joint (HCC) 04/06/2023   Aspiration pneumonia (HCC) 03/18/2023   Pressure injury of skin 03/18/2023   Sepsis (HCC) 03/18/2023   S/P total knee arthroplasty, right 12/15/2022   Acute hypoxic respiratory failure (HCC) 10/22/2022   Paroxysmal atrial fibrillation (HCC) 08/19/2022   Jaw pain 06/24/2022   Left hand pain 05/29/2022   Chronic systolic CHF (congestive heart failure) (HCC) 02/23/2022   Moderate mitral regurgitation 02/23/2022   Leg swelling 01/29/2022   ED (erectile dysfunction) 01/13/2021  Cyst, dermoid, scalp and neck 01/13/2021   Chronic kidney disease (CKD), stage III (moderate) (HCC) 12/13/2019   Carpal tunnel syndrome 12/05/2019   Elevated creatine kinase 11/15/2019   Aortic atherosclerosis (HCC) 11/15/2019   Multifocal pneumonia 05/28/2017   Recurrent Clostridium difficile diarrhea 05/28/2017   Peripheral neuropathy 03/17/2017   DM2 (diabetes mellitus, type 2) (HCC) 12/11/2016   Allergic rhinitis 12/11/2016   Degenerative arthritis of left knee 01/30/2016   Morbid obesity (HCC) 01/30/2016   Routine general medical examination at a health care  facility 04/20/2014   History of pulmonary embolus (PE) 12/19/2013   Acute renal failure superimposed on stage 3b chronic kidney disease (HCC) 12/19/2013   Anxiety state 03/29/2007   Depression 03/29/2007   OSA (obstructive sleep apnea) 03/29/2007   Unspecified glaucoma 03/29/2007   BPH (benign prostatic hyperplasia) 03/29/2007   OA (osteoarthritis) of knee 03/29/2007   Essential hypertension 03/29/2007   History of Bell's palsy 03/29/2007   PCP:  [facility], [facility] Pharmacy:   [id number] [place], [place] - [address] [place] [address] Phone: [phone] Fax: [phone]  [facility] [address] [place] [address] Phone: [phone] Fax: [phone]  [id number] [place], [place] - [address] AT [facility] [address] [place] [address] Phone: [phone] Fax: [phone]     Social Determinants of Health (SDOH) Social History: SDOH Screenings   Food Insecurity: No Food Insecurity (04/25/2023)  Housing: Low Risk  (04/25/2023)  Transportation Needs: No Transportation Needs (04/25/2023)  Utilities: Not At Risk (04/25/2023)  Alcohol Screen: Low Risk  (10/27/2022)  Depression (PHQ2-9): Low Risk  (01/04/2023)  Financial Resource Strain: Low Risk  (10/27/2022)  Physical Activity: Inactive (10/27/2022)  Social Connections: Moderately Integrated (10/27/2022)  Stress: No Stress Concern Present (12/31/2022)  Recent Concern: Stress - Stress Concern Present (10/30/2022)  Tobacco Use: Medium Risk (04/24/2023)   SDOH Interventions:     Readmission Risk Interventions    08/20/2022   11:10 AM  Readmission Risk Prevention Plan  Transportation Screening Complete  PCP or Specialist Appt within 3-5 Days Complete  HRI or Home Care Consult Complete  Palliative Care Screening Not Applicable  Medication Review (RN Care Manager) Complete

## 2023-04-27 LAB — CBC
HCT: 29.8 % — ABNORMAL LOW (ref 39.0–52.0)
Hemoglobin: 8.9 g/dL — ABNORMAL LOW (ref 13.0–17.0)
MCH: 24.5 pg — ABNORMAL LOW (ref 26.0–34.0)
MCHC: 29.9 g/dL — ABNORMAL LOW (ref 30.0–36.0)
MCV: 81.9 fL (ref 80.0–100.0)
Platelets: 226 10*3/uL (ref 150–400)
RBC: 3.64 MIL/uL — ABNORMAL LOW (ref 4.22–5.81)
RDW: 19.2 % — ABNORMAL HIGH (ref 11.5–15.5)
WBC: 11 10*3/uL — ABNORMAL HIGH (ref 4.0–10.5)
nRBC: 0 % (ref 0.0–0.2)

## 2023-04-27 LAB — BASIC METABOLIC PANEL
Anion gap: 10 (ref 5–15)
BUN: 21 mg/dL (ref 8–23)
CO2: 29 mmol/L (ref 22–32)
Calcium: 8.2 mg/dL — ABNORMAL LOW (ref 8.9–10.3)
Chloride: 100 mmol/L (ref 98–111)
Creatinine, Ser: 1.96 mg/dL — ABNORMAL HIGH (ref 0.61–1.24)
GFR, Estimated: 34 mL/min — ABNORMAL LOW (ref >60)
Glucose, Bld: 96 mg/dL (ref 70–99)
Potassium: 3.3 mmol/L — ABNORMAL LOW (ref 3.5–5.1)
Sodium: 139 mmol/L (ref 135–145)

## 2023-04-27 LAB — MRSA NEXT GEN BY PCR, NASAL: MRSA by PCR Next Gen: DETECTED — AB

## 2023-04-27 LAB — MAGNESIUM: Magnesium: 1.7 mg/dL (ref 1.7–2.4)

## 2023-04-27 LAB — PHOSPHORUS: Phosphorus: 3.2 mg/dL (ref 2.5–4.6)

## 2023-04-27 MED ORDER — MUPIROCIN 2 % EX OINT
1.0000 | TOPICAL_OINTMENT | Freq: Two times a day (BID) | CUTANEOUS | Status: AC
  Administered 2023-04-27 – 2023-05-01 (×10): 1 via NASAL
  Filled 2023-04-27 (×3): qty 22

## 2023-04-27 MED ORDER — VANCOMYCIN HCL IN DEXTROSE 1-5 GM/200ML-% IV SOLN: 1000.0000 mg | INTRAVENOUS | Status: DC

## 2023-04-27 MED ORDER — VANCOMYCIN HCL 750 MG/150ML IV SOLN
750.0000 mg | INTRAVENOUS | Status: DC
  Administered 2023-04-27 – 2023-04-29 (×3): 750 mg via INTRAVENOUS
  Filled 2023-04-27 (×3): qty 150

## 2023-04-27 MED ORDER — POTASSIUM CHLORIDE CRYS ER 20 MEQ PO TBCR
40.0000 meq | EXTENDED_RELEASE_TABLET | ORAL | Status: AC
  Administered 2023-04-27 (×2): 40 meq via ORAL
  Filled 2023-04-27 (×2): qty 2

## 2023-04-27 NOTE — Progress Notes (Signed)
PROGRESS NOTE    Tanner [name]  ZOX:[id number] DOB: 09-20-1945 DOA: 04/24/2023 [facility]CP[facility]   Brief Narrative:  This 77 y.o. male with medical history significant for chronic systolic heart failure, paroxysmal atrial fibrillation, history of PE on Eliquis, type 2 diabetes, C. difficile, CKD 3B, morbid obesity, recent bacteremia who presents with altered mental status and hypoxia. Patient was recently hospitalized from 10/22 to 10/30 for prosthetic right knee infection with MRSA/Enterococcus faecalis bacteremia s/p irrigation/debridement with poly exchange on 10/23.  He was followed by infectious disease and orthopedics.  He is to remain on IV daptomycin till 05/19/23 and then Doxy 100 mg twice daily indefinitely for suppressive therapy. He was found to be febrile with a temp of 101, hypoxia with SpO2 87% on 4 L.  Chest x-ray shows persistent bilateral lower lobe airspace opacities.  Findings could represent infection versus inflammation versus malignancy or atelectasis.  Patient is admitted for further evaluation.  Assessment & Plan:   Principal Problem:   Acute hypoxic respiratory failure (HCC) Active Problems:   Sepsis (HCC)   History of MRSA infection   Essential hypertension   History of pulmonary embolus (PE)   Morbid obesity (HCC)   DM2 (diabetes mellitus, type 2) (HCC)   Multifocal pneumonia   Chronic systolic CHF (congestive heart failure) (HCC)   Acute metabolic encephalopathy   Lung nodule  Acute hypoxic respiratory failure: Multifocal pneumonia: Likely secondary to multifocal pneumonia requiring 3 L of supplemental oxygen. Previously admitted for pneumonia with concerns for aspiration.  Continue dysphagia 3 diet. Empirically started on IV vancomycin, cefepime. De-escalate antibiotic as able to. Infectious disease consulted there is a concern for daptomycin induced eosinophilic pneumonia. Pulmonology is consulted follow-up recommendation.   Sepsis  [facility]): He presented with fever, tachycardia and leukocytosis with finding of multifocal pneumonia on CT chest Continue IV antibiotics as per ID   History of MRSA infection: Patient was recently hospitalized from 10/22 to 10/30 for prosthetic right knee infection with MRSA/Enterococcus faecalis bacteremia s/p irrigation/debridement with poly exchange on 10/23.  He was followed by infectious disease and orthopedics.  He is to remain on IV daptomycin till 12/4 and then Doxy 100 mg twice daily indefinitely for suppressive therapy. Discussed with inpatient pharmacy and okay to continue on IV vancomycin.  Infectious disease consulted, changed PICC line from right side to left side. Continue Vanco and cefepime for now.   Essential hypertension: Controlled. Continue Coreg, torsemide.   History of pulmonary embolus (PE) Continue Eliquis.   Lung nodule: Right lung nodules measuring up to 6 mm.  Non-contrast chest CT at 3-6 months is recommended. If the nodules are stable at time of repeat CT, then future CT at 18-24 months.   Acute metabolic encephalopathy: > Resolved. Likely sec. to sepsis and acute hypoxic respiratory failure secondary to multifocal pneumonia. Continue IV antibiotics. Patient appears back to his baseline mental status.   Chronic systolic CHF (congestive heart failure) (HCC) He is found to have  Bilateral LE edema on exam Continue Torsemide.   DM2 (diabetes mellitus, type 2) (HCC) Controlled. A1C of 5.3 last month    Morbid obesity (HCC) BMI of 42. Diet and exercise discussed in detail.   DVT prophylaxis:Eliquis Code Status:Full code Family Communication: No family at bed side Disposition Plan:     Status is: Inpatient Remains inpatient appropriate because: Admitted for acute hypoxic respiratory failure likely due to pneumonia.  Infectious disease consulted,  suspecting daptomycin induced eosinophilic pneumonia, pulmonology consulted  Consultants:  Infectious  diseases. Pulmonology  Procedures:  Antimicrobials:  Anti-infectives (From admission, onward)    Start     Dose/Rate Route Frequency Ordered Stop   04/27/23 2000  vancomycin (VANCOCIN) IVPB 1000 mg/200 mL premix  Status:  Discontinued        1,000 mg 200 mL/hr over 60 Minutes Intravenous Every 24 hours 04/27/23 0821 04/27/23 1225   04/27/23 2000  vancomycin (VANCOREADY) IVPB 750 mg/150 mL        750 mg 150 mL/hr over 60 Minutes Intravenous Every 24 hours 04/27/23 1225     04/25/23 2000  vancomycin (VANCOREADY) IVPB 750 mg/150 mL  Status:  Discontinued        750 mg 150 mL/hr over 60 Minutes Intravenous Every 24 hours 04/25/23 0033 04/27/23 0821   04/25/23 0800  ceFEPIme (MAXIPIME) 2 g in sodium chloride 0.9 % 100 mL IVPB        2 g 200 mL/hr over 30 Minutes Intravenous Every 12 hours 04/25/23 0033     04/24/23 2000  metroNIDAZOLE (FLAGYL) IVPB 500 mg        500 mg 100 mL/hr over 60 Minutes Intravenous  Once 04/24/23 1950 04/24/23 2314   04/24/23 2000  ceFEPIme (MAXIPIME) 2 g in sodium chloride 0.9 % 100 mL IVPB        2 g 200 mL/hr over 30 Minutes Intravenous  Once 04/24/23 1956 04/24/23 2158   04/24/23 2000  vancomycin (VANCOREADY) IVPB 2000 mg/400 mL        2,000 mg 200 mL/hr over 120 Minutes Intravenous  Once 04/24/23 1956 04/25/23 0043      Subjective: Patient was seen and examined at bedside. Overnight events noted.   Patient reports doing much better.  He was comfortably sitting , watching television. He reports severe itching in his body,  taking Benadryl as needed.  Objective: Vitals:   04/26/23 2034 04/26/23 2146 04/26/23 2150 04/27/23 0818  BP: (!) 149/57 (!) 124/90  (!) 154/68  Pulse: 91 89  82  Resp: 18   16  Temp: 98.8 F (37.1 C)     TempSrc: Oral     SpO2:  (!) 88% 92% 92%  Weight:      Height:        Intake/Output Summary (Last 24 hours) at 04/27/2023 1326 Last data filed at 04/27/2023 0238 Gross per 24 hour  Intake 739.11 ml  Output 1700 ml   Net -960.89 ml   Filed Weights   04/24/23 2122 04/25/23 0210  Weight: 111 kg 100.4 kg    Examination:  General exam: Appears comfortable, not in any distress.  Deconditioned Respiratory system: CTA bilaterally. Respiratory effort normal.  RR 16 Cardiovascular system: S1 & S2 heard, regular rate and rhythm, no murmur.  No pedal edema. Gastrointestinal system: Abdomen is non distended, soft and non tender.  Normal bowel sounds heard. Central nervous system: Alert and oriented x 3. No focal neurological deficits. Extremities: Right Knee incision noted, not infected. Skin: No rashes, lesions or ulcers Psychiatry: Judgement and insight appear normal. Mood & affect appropriate.     Data Reviewed: I have personally reviewed following labs and imaging studies  CBC: Recent Labs  Lab 04/24/23 1942 04/25/23 0305 04/26/23 0406 04/27/23 0409  WBC 11.2* 9.2 8.6 11.0*  NEUTROABS 9.7*  --   --   --   HGB 9.6* 8.6* 9.0* 8.9*  HCT 32.0* 29.2* 30.2* 29.8*  MCV 82.5 83.7 81.8 81.9  PLT 220 201 213 226   Basic Metabolic Panel:  Recent Labs  Lab 04/24/23 1942 04/25/23 0305 04/26/23 0406 04/27/23 0500  NA 137 139 137 139  K 3.5 3.5 3.4* 3.3*  CL 99 101 100 100  CO2 27 29 27 29   GLUCOSE 130* 110* 95 96  BUN 24* 22 23 21   CREATININE 1.72* 1.64* 1.59* 1.96*  CALCIUM 8.4* 8.1* 8.4* 8.2*  MG  --   --  1.9 1.7  PHOS  --   --  3.4 3.2   GFR: Estimated Creatinine Clearance: 33.3 mL/min (A) (by C-G formula based on SCr of 1.96 mg/dL (H)). Liver Function Tests: Recent Labs  Lab 04/24/23 1942  AST 17  ALT 15  ALKPHOS 74  BILITOT 0.7  PROT 5.4*  ALBUMIN 2.4*   No results for input(s): "LIPASE", "AMYLASE" in the last 168 hours. No results for input(s): "AMMONIA" in the last 168 hours. Coagulation Profile: Recent Labs  Lab 04/24/23 1942  INR 1.4*   Cardiac Enzymes: No results for input(s): "CKTOTAL", "CKMB", "CKMBINDEX", "TROPONINI" in the last 168 hours. BNP (last 3  results) No results for input(s): "PROBNP" in the last 8760 hours. HbA1C: No results for input(s): "HGBA1C" in the last 72 hours. CBG: No results for input(s): "GLUCAP" in the last 168 hours. Lipid Profile: No results for input(s): "CHOL", "HDL", "LDLCALC", "TRIG", "CHOLHDL", "LDLDIRECT" in the last 72 hours. Thyroid Function Tests: No results for input(s): "TSH", "T4TOTAL", "FREET4", "T3FREE", "THYROIDAB" in the last 72 hours. Anemia Panel: No results for input(s): "VITAMINB12", "FOLATE", "FERRITIN", "TIBC", "IRON", "RETICCTPCT" in the last 72 hours. Sepsis Labs: Recent Labs  Lab 04/24/23 1949 04/24/23 2127  LATICACIDVEN 1.1 1.4    Recent Results (from the past 240 hour(s))  Culture, blood (Routine x 2)     Status: None (Preliminary result)   Collection Time: 04/24/23  8:33 PM   Specimen: BLOOD LEFT HAND  Result Value Ref Range Status   Specimen Description BLOOD LEFT HAND  Final   Special Requests   Final    BOTTLES DRAWN AEROBIC AND ANAEROBIC Blood Culture adequate volume   Culture   Final    NO GROWTH 3 DAYS Performed at [facility], [address] [place], [place] [address]    Report Status PENDING  Incomplete  Culture, blood (Routine x 2)     Status: None (Preliminary result)   Collection Time: 04/24/23  9:10 PM   Specimen: BLOOD  Result Value Ref Range Status   Specimen Description BLOOD LEFT ANTECUBITAL  Final   Special Requests   Final    BOTTLES DRAWN AEROBIC AND ANAEROBIC Blood Culture adequate volume   Culture   Final    NO GROWTH 3 DAYS Performed at [facility], [address] [place], [place] [address]    Report Status PENDING  Incomplete  MRSA Next Gen by PCR, Nasal     Status: Abnormal   Collection Time: 04/26/23 11:07 AM   Specimen: Nasal Mucosa; Nasal Swab  Result Value Ref Range Status   MRSA by PCR Next Gen DETECTED (A) NOT DETECTED Final    Comment: RESULT CALLED TO, READ BACK BY AND VERIFIED WITH: RN D MOSSICT [id number] AT 1204 BY  CM (NOTE) The GeneXpert MRSA Assay (FDA approved for NASAL specimens only), is one component of a comprehensive MRSA colonization surveillance program. It is not intended to diagnose MRSA infection nor to guide or monitor treatment for MRSA infections. Test performance is not FDA approved in patients less than 79 years old. Performed at [facility], [address] [place], [place] [address]     Radiology Studies: [name] EKG SITE RITE  Result Date: 04/26/2023 If Site Rite image not attached, placement could not be confirmed due to current cardiac rhythm.    Scheduled Meds:  apixaban  5 mg Oral BID   buPROPion  150 mg Oral BID   carvedilol  6.25 mg Oral BID WC   Chlorhexidine Gluconate Cloth  6 each Topical Daily   finasteride  5 mg Oral QHS   gabapentin  600 mg Oral TID   loratadine  10 mg Oral Daily   mupirocin ointment  1 Application Nasal BID   nortriptyline  20 mg Oral QHS   pantoprazole  40 mg Oral Q1200   polyethylene glycol  17 g Oral Daily   potassium chloride  40 mEq Oral Q4H   senna  2 tablet Oral QHS   sodium chloride flush  10-40 mL Intracatheter Q12H   torsemide  20 mg Oral Daily   Continuous Infusions:  ceFEPime (MAXIPIME) IV 2 g (04/27/23 1007)   vancomycin       LOS: 3 days    Time spent: 35 mins    Willeen Niece, MD [facility]   If 7PM-7AM, please contact night-coverage

## 2023-04-27 NOTE — Progress Notes (Signed)
I did place a call to patient's son Luisa Hart and Unique Darty did not answer their phones, generic voice box  Left a message that I will call back

## 2023-04-27 NOTE — Consult Note (Signed)
NAME:  Tanner [name], MRN:  [id number], DOB:  Apr 01, 1946, LOS: 3 ADMISSION DATE:  04/24/2023, CONSULTATION DATE:  04/27/2023 REFERRING MD:  Idelle Leech, hospitalist, CHIEF COMPLAINT:  pneumonia   History of Present Illness:  77 year old male with extensive past medical history including CHF, HTN, HLD, paroxysmal atrial fibrillation, PE/DVT on Eliquis, diabetes, c.difficile, CKD 3, obesity with recent admission for AKI and polymicrobial bacteremia (MRSA, e. Faecalis) from periprosthetic right knee infection, discharged on daptomycin who presented to the ED on 11/9 with altered mental status. Per EDP note, presented with two days of cough and altered mental status. Son prsented with patient and noted he had fallen three times in the three days prior to presentation. Son also noted him falling asleep during conversation, not acting himself. In the ED, noted to be febrile to 101, O2 sat 87% on room air. Labs notable for WBC 11.2, Cr 1.72, BNP 174.5. CXR with bilateral lower lobe opacities. CT head negative. CT chest with multifocal GGO consistent with multifocal pneumonia.   Pertinent  Medical History  CHF, HTN, HLD, paroxysmal atrial fibrillation, PE/DVT on Eliquis, diabetes, c.difficile, CKD 3, obesity  Significant Hospital Events: Including procedures, antibiotic start and stop dates in addition to other pertinent events   11/9: admitted with multifocal pneumonia  11/10: on vanc/flagyl/cefepime, ID consulted recommended con't vanc/cefepime, remove picc, ?daptomycin related pneumonia  11/11: consult pulm for ?steroids vs other for possible dapto induced eosinophilic pna   Interim History / Subjective:  States he has had cough for about 1 week. Not coughing anything up, although productive cough. Felt like he was maybe improving yesterday, but continues to have increased work of breathing. No sick contacts. History of smoking and quit in 2003.   Objective   Blood pressure (!) 154/68, pulse 82,  temperature 98.8 F (37.1 C), temperature source Oral, resp. rate 16, height 5\' 4"  (1.626 m), weight 100.4 kg, SpO2 92%.        Intake/Output Summary (Last 24 hours) at 04/27/2023 1325 Last data filed at 04/27/2023 9563 Gross per 24 hour  Intake 739.11 ml  Output 1700 ml  Net -960.89 ml   Filed Weights   04/24/23 2122 04/25/23 0210  Weight: 111 kg 100.4 kg    Examination: General: elderly, ill appearing male sitting OOB in chair on 2LNC  HENT: anicteric sclera, mucous membranes moist, EOMI Lungs: rhonchi bilaterally, upper airway congestion Cardiovascular: no murmur, rub, gallop Abdomen: rounded, soft  Extremities: no significant edema  Neuro: alert, oriented, appropriate GU: defer  Resolved Hospital Problem list     Assessment & Plan:  Acute hypoxic respiratory failure Multifocal pneumonia  Known recent MRSA/e faecalis bacteremia and discharged on daptomycin on 10/30 (to end course 12/4). Returning for 2 days of cough, altered mental status, fever. CT chest 11/9 with GGO concerning for multifocal pneumonia R>L. MRSA PCR +, blood cultures NGTD. On 2-3LNC.  Cefepime 11/09> Vancomycin 11/09> Plan  - bronchoscopy for definitive diagnosis will be optimal to rule out eosinophilic pneumonia versus a bacterial infection -Expectation with him being on antibiotics already is cultures may be negative but a BAL will at least give guidance with respect to eosinophilia which can help with guidance regarding steroids. - con't vanc/cefepime for now  - + urine legionella, strep, RVP  - needs swallow eval to rule out aspiration component   Bronchoscopy was discussed with patient Patient wants his son's input before agreeing to the procedure -I did place a call to both his sons and did not get  a response so far  Discontinue daptomycin -Dependent on dosing and  timeline, usually takes about 2 to 4 weeks of exposure Steroids can be considered as add on treatment This may also just be a  community-acquired pneumonia which should respond to antibiotics  Patient also did state that he coughs around post mealtimes -I believe he will benefit from swallow evaluation as he may have silent aspirations contributing to ongoing clinical state  Below per primary team  Hx MRSA infection HTN PE/DVT Chronic systolic CHF  DM2 Obesity   Best Practice (right click and "Reselect all SmartList Selections" daily)  Per primary team Diet/type: Regular consistency (see orders) DVT prophylaxis: DOAC GI prophylaxis: PPI Lines: N/A Foley:  N/A Code Status:  full code Last date of multidisciplinary goals of care discussion []   Labs   CBC: Recent Labs  Lab 04/24/23 1942 04/25/23 0305 04/26/23 0406 04/27/23 0409  WBC 11.2* 9.2 8.6 11.0*  NEUTROABS 9.7*  --   --   --   HGB 9.6* 8.6* 9.0* 8.9*  HCT 32.0* 29.2* 30.2* 29.8*  MCV 82.5 83.7 81.8 81.9  PLT 220 201 213 226    Basic Metabolic Panel: Recent Labs  Lab 04/24/23 1942 04/25/23 0305 04/26/23 0406 04/27/23 0500  NA 137 139 137 139  K 3.5 3.5 3.4* 3.3*  CL 99 101 100 100  CO2 27 29 27 29   GLUCOSE 130* 110* 95 96  BUN 24* 22 23 21   CREATININE 1.72* 1.64* 1.59* 1.96*  CALCIUM 8.4* 8.1* 8.4* 8.2*  MG  --   --  1.9 1.7  PHOS  --   --  3.4 3.2   GFR: Estimated Creatinine Clearance: 33.3 mL/min (A) (by C-G formula based on SCr of 1.96 mg/dL (H)). Recent Labs  Lab 04/24/23 1942 04/24/23 1949 04/24/23 2127 04/25/23 0305 04/26/23 0406 04/27/23 0409  WBC 11.2*  --   --  9.2 8.6 11.0*  LATICACIDVEN  --  1.1 1.4  --   --   --     Liver Function Tests: Recent Labs  Lab 04/24/23 1942  AST 17  ALT 15  ALKPHOS 74  BILITOT 0.7  PROT 5.4*  ALBUMIN 2.4*   No results for input(s): "LIPASE", "AMYLASE" in the last 168 hours. No results for input(s): "AMMONIA" in the last 168 hours.  ABG    Component Value Date/Time   HCO3 27.2 03/18/2023 1830   TCO2 27 03/18/2023 0211   O2SAT 57.7 03/18/2023 1830      Coagulation Profile: Recent Labs  Lab 04/24/23 1942  INR 1.4*    Cardiac Enzymes: No results for input(s): "CKTOTAL", "CKMB", "CKMBINDEX", "TROPONINI" in the last 168 hours.  HbA1C: Hemoglobin A1C  Date/Time Value Ref Range Status  04/15/2020 12:00 AM 5.4  Final   Hgb A1c MFr Bld  Date/Time Value Ref Range Status  03/22/2023 09:20 AM 5.3 4.8 - 5.6 % Final    Comment:    (NOTE) Pre diabetes:          5.7%-6.4%  Diabetes:              >6.4%  Glycemic control for   <7.0% adults with diabetes   12/02/2022 02:40 PM 4.8 4.8 - 5.6 % Final    Comment:    (NOTE) Pre diabetes:          5.7%-6.4%  Diabetes:              >6.4%  Glycemic control for   <7.0% adults with diabetes  CBG: No results for input(s): "GLUCAP" in the last 168 hours.  Review of Systems:   As above  Past Medical History:  He,  has a past medical history of Acute kidney failure (HCC), Anxiety, Arthritis, Back pain, BPH (benign prostatic hypertrophy), CHF (congestive heart failure) (HCC), Clostridium difficile infection, Clotting disorder (HCC), Depression, Diabetes (HCC) (12/11/2016), DVT (deep venous thrombosis) (HCC), DVT of deep femoral vein, right (HCC) (06/29/2018), Edema, lower extremity, High cholesterol, Hypertension, Joint pain, Low back pain potentially associated with spinal stenosis, Neuromuscular disorder (HCC), Neuropathy of lower extremity, OSA (obstructive sleep apnea), Osteoarthritis, PE (pulmonary embolism), Pneumonia, and Ventral hernia.   Surgical History:   Past Surgical History:  Procedure Laterality Date   CARDIOVERSION N/A 08/19/2022   Procedure: CARDIOVERSION;  Surgeon: Meriam Sprague, MD;  Location: [facility] ENDOSCOPY;  Service: Cardiovascular;  Laterality: N/A;   EYE SURGERY     HERNIA REPAIR  1999   I & D KNEE WITH POLY EXCHANGE Right 04/07/2023   Procedure: IRRIGATION AND DEBRIDEMENT KNEE WITH POLY EXCHANGE;  Surgeon: Durene Romans, MD;  Location: [facility] OR;  Service:  Orthopedics;  Laterality: Right;   TOTAL KNEE ARTHROPLASTY Left 08/26/2020   Procedure: LEFT TOTAL KNEE ARTHROPLASTY;  Surgeon: Gean Birchwood, MD;  Location: [facility] ORS;  Service: Orthopedics;  Laterality: Left;   TOTAL KNEE ARTHROPLASTY Right 12/15/2022   Procedure: TOTAL KNEE ARTHROPLASTY;  Surgeon: Durene Romans, MD;  Location: [facility] ORS;  Service: Orthopedics;  Laterality: Right;   TRANSESOPHAGEAL ECHOCARDIOGRAM (CATH LAB) N/A 04/12/2023   Procedure: TRANSESOPHAGEAL ECHOCARDIOGRAM;  Surgeon: Chrystie Nose, MD;  Location: [facility] INVASIVE CV LAB;  Service: Cardiovascular;  Laterality: N/A;     Social History:   reports that he quit smoking about 21 years ago. His smoking use included cigarettes. He started smoking about 54 years ago. He has a 66.1 pack-year smoking history. He has never used smokeless tobacco. He reports current alcohol use. He reports that he does not use drugs.   Family History:  His family history includes Alzheimer's disease in his mother; Depression in his mother; Diabetes in his mother; Hyperlipidemia in his mother; Other (age of onset: 60) in his father; Pneumonia in his mother; Thyroid disease in his mother. There is no history of Colon cancer or Colon polyps.   Allergies Allergies  Allergen Reactions   Lisinopril Other (See Comments)     Renal impairment   Amlodipine Swelling   Codeine Sulfate Itching and Nausea Only     Critical care time: n/a    [facility] 04/27/2023, 1:55 PM  Please see [email or web address] for pager details.  From 7A-7P if no response, please call [phone]. After hours, please call ELink [phone].   Virl Diamond, MD [place] [facility] Pager: See Loretha Stapler

## 2023-04-27 NOTE — Progress Notes (Signed)
Physical Therapy Treatment Patient Details Name: Tanner [name] MRN: [id number] DOB: 1945-12-29 Today's Date: 04/27/2023   History of Present Illness The pt is a 77 yo male presenting from [facility] 11[facility] with AMS and acute hypoxic resp failure due to multifocal PNA. PMH includes: CHF, afib, PE on Eliquis, DM II, C. difficile, CKD 3B, morbid obesity, L TKR 2022, R TKR 12/15/2022, and admission 04/06/23 for RLE cellulitis with I&D with poly exchange on 10/23.    PT Comments  Pt received sitting in the recliner and agreeable to session. Pt able to stand from recliner with CGA, however heavily relies on BUE due to BLE weakness. Pt demonstrates increased instability during standing marches with R knee buckling. Pt requires multiple seated rest breaks due to quick fatigue and pt reports feeling "shaky". Unable to progress gait distance due to fatigue and BLE instability. Pt continues to benefit from PT services to progress toward functional mobility goals.    If plan is discharge home, recommend the following: Assistance with cooking/housework;Direct supervision/assist for medications management;Direct supervision/assist for financial management;Assist for transportation;Help with stairs or ramp for entrance;A little help with walking and/or transfers;A little help with bathing/dressing/bathroom   Can travel by private vehicle     No  Equipment Recommendations  None recommended by PT    Recommendations for Other Services       Precautions / Restrictions Precautions Precautions: Fall Precaution Comments: watch O2 Restrictions Weight Bearing Restrictions: No RLE Weight Bearing: Weight bearing as tolerated     Mobility  Bed Mobility               General bed mobility comments: Pt received sitting in recliner    Transfers Overall transfer level: Needs assistance Equipment used: Rolling walker (2 wheels) Transfers: Sit to/from Stand Sit to Stand: Contact guard assist            General transfer comment: STS x5 from recliner with heavy reliance on BUE for power up. CGA for safety    Ambulation/Gait             Pre-gait activities: standing marching General Gait Details: deferred due to BLE instability and fatigue       Balance Overall balance assessment: Needs assistance Sitting-balance support: No upper extremity supported, Feet supported Sitting balance-Leahy Scale: Good Sitting balance - Comments: sitting in recliner   Standing balance support: Bilateral upper extremity supported, During functional activity, Reliant on assistive device for balance Standing balance-Leahy Scale: Poor Standing balance comment: reliant on RW support                            Cognition Arousal: Alert Behavior During Therapy: WFL for tasks assessed/performed Overall Cognitive Status: Difficult to assess                                 General Comments: Difficulty to understand pt at times and need for increased cues intermittently        Exercises General Exercises - Lower Extremity Long Arc Quad: AROM, Seated, 10 reps, Right Hip Flexion/Marching: AROM, Both, 10 reps, Standing (x2)    General Comments General comments (skin integrity, edema, etc.): Difficulty obtaining a stable SpO2 pleth, however reading 86-88% on RA at rest, so pt remained on 2 L throughout session      Pertinent Vitals/Pain Pain Assessment Pain Assessment: No/denies pain     PT Goals (  current goals can now be found in the care plan section) Acute Rehab PT Goals Patient Stated Goal: to feel stronger so I can go home by myself (pt lives alone) PT Goal Formulation: With patient Time For Goal Achievement: 05/09/23 Progress towards PT goals: Progressing toward goals    Frequency    Min 1X/week       AM-PAC PT "6 Clicks" Mobility   Outcome Measure  Help needed turning from your back to your side while in a flat bed without using bedrails?: A  Little Help needed moving from lying on your back to sitting on the side of a flat bed without using bedrails?: A Little Help needed moving to and from a bed to a chair (including a wheelchair)?: A Little Help needed standing up from a chair using your arms (e.g., wheelchair or bedside chair)?: A Little Help needed to walk in hospital room?: A Lot Help needed climbing 3-5 steps with a railing? : Total 6 Click Score: 15    End of Session Equipment Utilized During Treatment: Gait belt Activity Tolerance: Patient limited by fatigue Patient left: in chair;with call bell/phone within reach;with chair alarm set Nurse Communication: Mobility status PT Visit Diagnosis: Other abnormalities of gait and mobility (R26.89);Muscle weakness (generalized) (M62.81);Repeated falls (R29.6)     Time: 0347-4259 PT Time Calculation (min) (ACUTE ONLY): 25 min  Charges:    $Therapeutic Exercise: 8-22 mins $Therapeutic Activity: 8-22 mins PT General Charges $$ ACUTE PT VISIT: 1 Visit                     Johny Shock, PTA Acute Rehabilitation Services Secure Chat Preferred  [phone]    Johny Shock 04/27/2023, 1:44 PM

## 2023-04-27 NOTE — Progress Notes (Signed)
At 2153 RN was rounding on the patient. The patient had just been transferred to from the chair to the bed. The patient then stated that he wanted to reposition higher in the bed. Patient then stood up with his walker. As RN was assisting the patient lost his balance and began to descend slowly. RN was then able to assist patient to the floor with no injuries. Patient assisted back in bed by staff. No complaints of pain at this time. MD notified. Safety precautions remain in place.

## 2023-04-27 NOTE — Progress Notes (Signed)
Pharmacy Antibiotic Note  Tanner [name] is a 77 y.o. male admitted on 04/24/2023 with altered mental status and hypoxia with multifocal pneumonia.  Pharmacy has been consulted for cefepime and vancomycin dosing.  PMH includes recent hospitalization for prosthetic knee infection with MRSA/enterococcus faecalis bacteremia s/p irrigation/debridement with poly exchange on 04/07/23. Patient previously on daptomycin until 05/19/2023 then to be converted to doxycycline 100mg  BID for suppressive therapy   WBC 11.2, sCr 1.72 >> 1.59 >> 1.96, lactate 1.4, Tmax 101 Blood cultures collected Given 1 dose of vancomycin 2g IV, cefepime 2g, and flagyl 500mg  IV in the ED  Plan: -Cefepime 2g IV every 12 hours -adjust Vancomycin to 750 mg IV every 24 hours (AUC 467, adjBW, Vd 0.5) -Monitor renal function -Follow up signs of clinical improvement, LOT, de-escalation of antibiotics   Height: 5\' 4"  (162.6 cm) Weight: 100.4 kg (221 lb 5.5 oz) IBW/kg (Calculated) : 59.2  Temp (24hrs), Avg:98.5 F (36.9 C), Min:98.1 F (36.7 C), Max:98.8 F (37.1 C)  Recent Labs  Lab 04/24/23 1942 04/24/23 1949 04/24/23 2127 04/25/23 0305 04/26/23 0406 04/27/23 0409 04/27/23 0500  WBC 11.2*  --   --  9.2 8.6 11.0*  --   CREATININE 1.72*  --   --  1.64* 1.59*  --  1.96*  LATICACIDVEN  --  1.1 1.4  --   --   --   --     Estimated Creatinine Clearance: 33.3 mL/min (A) (by C-G formula based on SCr of 1.96 mg/dL (H)).    Allergies  Allergen Reactions   Lisinopril Other (See Comments)     Renal impairment   Amlodipine Swelling   Codeine Sulfate Itching and Nausea Only    Antimicrobials this admission: Cefepime 11/9 >>  Flagyl 11/9 >>  Vancomycin 11/9 >>  Microbiology results: 10/24 bcx ngtdF 10/23 bcx MRSA 10/22 bcx e faecalis + MRSA, BCID w/ strep species (no vancomycin resistance) 10/22 knee cx MRSA 11/9 BCx: ngtd3  Thank you for allowing pharmacy to be a part of this patient's care.  Greta Doom BS,  PharmD, BCPS Clinical Pharmacist 04/27/2023 12:25 PM  Contact: [phone] after 3 PM  "Be curious, not judgmental..." -Debbora Dus

## 2023-04-27 NOTE — Progress Notes (Signed)
Mobility Specialist Progress Note:    04/27/23 0845  Mobility  Activity Transferred from bed to chair  Level of Assistance Moderate assist, patient does 50-74%  Assistive Device Front wheel walker  Distance Ambulated (ft) 3 ft  Activity Response Tolerated fair  Mobility Referral Yes  $Mobility charge 1 Mobility  Mobility Specialist Start Time (ACUTE ONLY) 0845  Mobility Specialist Stop Time (ACUTE ONLY) 0855  Mobility Specialist Time Calculation (min) (ACUTE ONLY) 10 min   Pt received in bed, agreeable to mobility session. ModA required to stand. Deferred ambulation in room d/t knee buckling and weakness. Tolerated fairly, seemingly lethargic. Left pt in chair, alarm on, call bell in reach and all needs met.   Feliciana Rossetti Mobility Specialist Please contact via Special educational needs teacher or  Rehab office at [phone]

## 2023-04-27 NOTE — Result Encounter Note (Signed)
I will discuss with patient at upcoming visit.

## 2023-04-28 ENCOUNTER — Encounter (HOSPITAL_COMMUNITY): Payer: Self-pay | Admitting: Certified Registered"

## 2023-04-28 ENCOUNTER — Inpatient Hospital Stay: Payer: Medicare HMO | Admitting: Internal Medicine

## 2023-04-28 ENCOUNTER — Encounter (HOSPITAL_COMMUNITY): Payer: Self-pay | Admitting: Family Medicine

## 2023-04-28 ENCOUNTER — Encounter (HOSPITAL_COMMUNITY)
Admission: EM | Disposition: A | Discharge status: Skilled Nursing Facility | Payer: Self-pay | Source: Skilled Nursing Facility | Attending: Internal Medicine

## 2023-04-28 LAB — MAGNESIUM: Magnesium: 1.9 mg/dL (ref 1.7–2.4)

## 2023-04-28 LAB — BASIC METABOLIC PANEL WITH GFR
Anion gap: 7 (ref 5–15)
BUN: 21 mg/dL (ref 8–23)
CO2: 27 mmol/L (ref 22–32)
Calcium: 8.3 mg/dL — ABNORMAL LOW (ref 8.9–10.3)
Chloride: 102 mmol/L (ref 98–111)
Creatinine, Ser: 1.75 mg/dL — ABNORMAL HIGH (ref 0.61–1.24)
GFR, Estimated: 39 mL/min — ABNORMAL LOW
Glucose, Bld: 87 mg/dL (ref 70–99)
Potassium: 4.1 mmol/L (ref 3.5–5.1)
Sodium: 136 mmol/L (ref 135–145)

## 2023-04-28 LAB — CBC
HCT: 29.7 % — ABNORMAL LOW (ref 39.0–52.0)
Hemoglobin: 8.7 g/dL — ABNORMAL LOW (ref 13.0–17.0)
MCH: 24 pg — ABNORMAL LOW (ref 26.0–34.0)
MCHC: 29.3 g/dL — ABNORMAL LOW (ref 30.0–36.0)
MCV: 82 fL (ref 80.0–100.0)
Platelets: 204 10*3/uL (ref 150–400)
RBC: 3.62 MIL/uL — ABNORMAL LOW (ref 4.22–5.81)
RDW: 19 % — ABNORMAL HIGH (ref 11.5–15.5)
WBC: 9.9 10*3/uL (ref 4.0–10.5)
nRBC: 0 % (ref 0.0–0.2)

## 2023-04-28 LAB — VANCOMYCIN, PEAK: Vancomycin Pk: 15 ug/mL — ABNORMAL LOW (ref 30–40)

## 2023-04-28 LAB — PHOSPHORUS: Phosphorus: 3.4 mg/dL (ref 2.5–4.6)

## 2023-04-28 SURGERY — CANCELLED PROCEDURE
Laterality: Bilateral

## 2023-04-28 NOTE — Plan of Care (Signed)
  Problem: Education: Goal: Knowledge of General Education information will improve Description: Including pain rating scale, medication(s)/side effects and non-pharmacologic comfort measures Outcome: Progressing   Problem: Elimination: Goal: Will not experience complications related to urinary retention Outcome: Progressing   Problem: Pain Management: Goal: General experience of comfort will improve Outcome: Progressing

## 2023-04-28 NOTE — Progress Notes (Addendum)
   NAME:  Tanner [name], MRN:  [id number], DOB:  1945/07/12, LOS: 4 ADMISSION DATE:  04/24/2023, CONSULTATION DATE: 04/27/2023 REFERRING MD: Dr. Idelle Leech, CHIEF COMPLAINT: Pneumonia  History of Present Illness:  77 year old male with extensive past medical history including CHF, HTN, HLD, paroxysmal atrial fibrillation, PE/DVT on Eliquis, diabetes, c.difficile, CKD 3, obesity with recent admission for AKI and polymicrobial bacteremia (MRSA, e. Faecalis) from periprosthetic right knee infection, discharged on daptomycin who presented to the ED on 11/9 with altered mental status. Per EDP note, presented with two days of cough and altered mental status. Son prsented with patient and noted he had fallen three times in the three days prior to presentation. Son also noted him falling asleep during conversation, not acting himself. In the ED, noted to be febrile to 101, O2 sat 87% on room air. Labs notable for WBC 11.2, Cr 1.72, BNP 174.5. CXR with bilateral lower lobe opacities. CT head negative. CT chest with multifocal GGO consistent with multifocal pneumonia.   Pertinent  Medical History  CHF, HTN, HLD, paroxysmal atrial fibrillation, PE/DVT on Eliquis, diabetes, c.difficile, CKD 3, obesity    Significant Hospital Events: Including procedures, antibiotic start and stop dates in addition to other pertinent events   11/9: admitted with multifocal pneumonia  11/10: on vanc/flagyl/cefepime, ID consulted recommended con't vanc/cefepime, remove picc, ?daptomycin related pneumonia  11/11: consult pulm for ?steroids vs other for possible dapto induced eosinophilic pna   Interim History / Subjective:  Cough Feeling little bit better Did have a presentation with cough, fever past history of smoking quit 2003  Objective   Blood pressure 110/65, pulse 74, temperature 97.9 F (36.6 C), resp. rate 17, height 5\' 4"  (1.626 m), weight 100.4 kg, SpO2 93%.        Intake/Output Summary (Last 24 hours) at  04/28/2023 5956 Last data filed at 04/28/2023 3875 Gross per 24 hour  Intake 587 ml  Output 1200 ml  Net -613 ml   Filed Weights   04/24/23 2122 04/25/23 0210  Weight: 111 kg 100.4 kg    Examination: General: Elderly, does not appear to be in distress, on oxygen supplementation HENT: Moist oral mucosa Lungs: Bilateral rhonchi Cardiovascular: S1-S2 appreciated Abdomen: Soft, bowel sounds appreciated Extremities: No clubbing, no edema Neuro: Alert and oriented x 3 GU: Thad Ranger output  [facility] Problem list     Assessment & Plan:  Acute hypoxemic respiratory failure -Remains on oxygen supplementation   Multilobar pneumonia Concern for daptomycin induced lung injury -Bronchoscopy discussed with patient and his sons  -Agreeable to bronchoscopy -Need to rule out significant eosinophilia as this will be consistent with daptomycin injury  He remains on dual antibiotics including cefepime and vancomycin at present  Concern for aspiration -Suggest speech evaluation  History of MRSA infection, hypertension, PE/DVT, diastolic heart failure, type 2 diabetes Rest of the management per primary service   Case request placed to endoscopy Awaiting timing of bronchoscopy  Virl Diamond, MD [facility] Pager: See Loretha Stapler

## 2023-04-28 NOTE — H&P (View-Only) (Signed)
Bronchoscopy scheduled for 1315 today

## 2023-04-28 NOTE — Progress Notes (Signed)
[facility]                                                                               Tanner [name], is a 77 y.o. male, DOB - December 06, 1945, ZHY:[id number] Admit date - 04/24/2023    [facility] the [facility]  LOS - 4  days    Brief summary   77 y.o. male with medical history significant for chronic systolic heart failure, paroxysmal atrial fibrillation, history of PE on Eliquis, type 2 diabetes, C. difficile, CKD 3B, morbid obesity, recent bacteremia who presents with altered mental status and hypoxia. Patient was recently hospitalized from 10/22 to 10/30 for prosthetic right knee infection with MRSA/Enterococcus faecalis bacteremia s/p irrigation/debridement with poly exchange on 10/23.  He was followed by infectious disease and orthopedics.  He is to remain on IV daptomycin till 05/19/23 and then Doxy 100 mg twice daily indefinitely for suppressive therapy. He was found to be febrile with a temp of 101, hypoxia with SpO2 87% on 4 L.  Chest x-ray shows persistent bilateral lower lobe airspace opacities.  Findings could represent infection versus inflammation versus malignancy or atelectasis.  Patient is admitted for further evaluation.   Assessment & Plan    Assessment and Plan: * Acute hypoxic respiratory failure (HCC)/ Multifocal pneumonia.  Initially started the patient on IV vancomycin and cefepime.  ID consulted not on board PCCM consulted for evaluation of eosinophilic pneumonia from daptomycin and he is scheduled for bronchoscopy in the morning. Patient remains afebrile and WBC count improving Blood cultures have been negative so far  Sepsis (HCC) - Presented with fever, tachycardia and leukocytosis with finding of multifocal pneumonia on CT chest -Continue with antibiotics as per infectious disease  History of MRSA infection - Patient was recently hospitalized from 10/22 to 10/30 for prosthetic right knee infection with  MRSA/Enterococcus faecalis bacteremia s/p irrigation/debridement with poly exchange on 10/23.  He was followed by infectious disease and orthopedics.  Patient was supposed to be on IV daptomycin till 12/4 and then Doxy 100 mg twice daily indefinitely for suppressive therapy. -ID consulted, PICC line changed.  Essential hypertension Blood pressure parameters are optimal  History of pulmonary embolus (PE) -continue Eliquis  Lung nodule -Right lung nodules measuring up to 6 mm. Non-contrast chest CT at 3-6 months is recommended. If the nodules are stable at time of repeat CT, then future CT at 18-24 months  Acute metabolic encephalopathy -secondary to sepsis and acute hypoxic respiratory failure secondary to multifocal pneumonia  -Patient appears to be back to baseline he is alert and oriented.  And answering all questions appropriately  Chronic systolic CHF (congestive heart failure) (HCC) -b/l LE edema on exam -continue Torsemide   DM2 (diabetes mellitus, type 2) (HCC) -controlled. A1C of 5.3 last month    Morbid obesity (HCC) BMI of 42         RN Pressure Injury Documentation: Pressure Injury 03/18/23 Buttocks Left;Right Stage 2 -  Partial thickness loss of dermis presenting as a shallow open injury with a red, pink wound bed without slough. (Active)  03/18/23 0629  Location: Buttocks  Location Orientation: Left;Right  Staging: Stage 2 -  Partial thickness loss of dermis presenting as a shallow open injury with a red, pink wound bed without slough.  Wound Description (Comments):   Present on Admission: Yes  Dressing Type Foam - Lift dressing to assess site every shift 04/27/23 2013      Estimated body mass index is 37.99 kg/m as calculated from the following:   Height as of this encounter: 5\' 4"  (1.626 m).   Weight as of this encounter: 100.4 kg.  Code Status: full code.  DVT Prophylaxis:   apixaban (ELIQUIS) tablet 5 mg   Level of Care: Level of care:  Telemetry Medical Family Communication: none at bedside.   Disposition Plan:     Remains inpatient appropriate:  Bronchoscopy in am.   Procedures:  bRONCHOSCOPY Consultants:   PCCM  Antimicrobials:   Anti-infectives (From admission, onward)    Start     Dose/Rate Route Frequency Ordered Stop   04/27/23 2000  vancomycin (VANCOCIN) IVPB 1000 mg/200 mL premix  Status:  Discontinued        1,000 mg 200 mL/hr over 60 Minutes Intravenous Every 24 hours 04/27/23 0821 04/27/23 1225   04/27/23 2000  vancomycin (VANCOREADY) IVPB 750 mg/150 mL        750 mg 150 mL/hr over 60 Minutes Intravenous Every 24 hours 04/27/23 1225     04/25/23 2000  vancomycin (VANCOREADY) IVPB 750 mg/150 mL  Status:  Discontinued        750 mg 150 mL/hr over 60 Minutes Intravenous Every 24 hours 04/25/23 0033 04/27/23 0821   04/25/23 0800  ceFEPIme (MAXIPIME) 2 g in sodium chloride 0.9 % 100 mL IVPB        2 g 200 mL/hr over 30 Minutes Intravenous Every 12 hours 04/25/23 0033 05/02/23 0759   04/24/23 2000  metroNIDAZOLE (FLAGYL) IVPB 500 mg        500 mg 100 mL/hr over 60 Minutes Intravenous  Once 04/24/23 1950 04/24/23 2314   04/24/23 2000  ceFEPIme (MAXIPIME) 2 g in sodium chloride 0.9 % 100 mL IVPB        2 g 200 mL/hr over 30 Minutes Intravenous  Once 04/24/23 1956 04/24/23 2158   04/24/23 2000  vancomycin (VANCOREADY) IVPB 2000 mg/400 mL        2,000 mg 200 mL/hr over 120 Minutes Intravenous  Once 04/24/23 1956 04/25/23 0043        Medications  Scheduled Meds:  apixaban  5 mg Oral BID   buPROPion  150 mg Oral BID   carvedilol  6.25 mg Oral BID WC   Chlorhexidine Gluconate Cloth  6 each Topical Daily   finasteride  5 mg Oral QHS   gabapentin  600 mg Oral TID   loratadine  10 mg Oral Daily   mupirocin ointment  1 Application Nasal BID   nortriptyline  20 mg Oral QHS   pantoprazole  40 mg Oral Q1200   polyethylene glycol  17 g Oral Daily   senna  2 tablet Oral QHS   sodium chloride flush   10-40 mL Intracatheter Q12H   torsemide  20 mg Oral Daily   Continuous Infusions:  ceFEPime (MAXIPIME) IV 2 g (04/28/23 0903)   vancomycin Stopped (04/27/23 2223)   PRN Meds:.diphenhydrAMINE, sodium chloride flush    Subjective:   Renn Bucholz was seen and examined today.  Reports feeling good. Says he is sorry that he took apple sauce with meds.   Objective:   Vitals:   04/27/23 1537 04/27/23 2032 04/28/23  8657 04/28/23 0736  BP: 136/67 (!) 108/51 110/65 121/75  Pulse: 78 74 74 80  Resp: 17 17 17 17   Temp:  98.2 F (36.8 C) 97.9 F (36.6 C) 97.6 F (36.4 C)  TempSrc:      SpO2: 96% 94% 93% 95%  Weight:      Height:        Intake/Output Summary (Last 24 hours) at 04/28/2023 1530 Last data filed at 04/28/2023 1249 Gross per 24 hour  Intake 587 ml  Output 2000 ml  Net -1413 ml   Filed Weights   04/24/23 2122 04/25/23 0210  Weight: 111 kg 100.4 kg     Exam General exam: Appears calm and comfortable  Respiratory system: air entry fair. On 2 lit of [place] oxygen.  Cardiovascular system: S1 & S2 heard, RRR.  Gastrointestinal system: Abdomen is nondistended, soft and nontender.  Central nervous system: Alert and oriented. No focal neurological deficits. Extremities: No pedal edema Skin: No rashes, Psychiatry: Mood & affect appropriate.     Data Reviewed:  I have personally reviewed following labs and imaging studies   CBC Lab Results  Component Value Date   WBC 9.9 04/28/2023   RBC 3.62 (L) 04/28/2023   HGB 8.7 (L) 04/28/2023   HCT 29.7 (L) 04/28/2023   MCV 82.0 04/28/2023   MCH 24.0 (L) 04/28/2023   PLT 204 04/28/2023   MCHC 29.3 (L) 04/28/2023   RDW 19.0 (H) 04/28/2023   LYMPHSABS 0.6 (L) 04/24/2023   MONOABS 0.6 04/24/2023   EOSABS 0.2 04/24/2023   BASOSABS 0.1 04/24/2023     Last metabolic panel Lab Results  Component Value Date   NA 136 04/28/2023   K 4.1 04/28/2023   CL 102 04/28/2023   CO2 27 04/28/2023   BUN 21 04/28/2023    CREATININE 1.75 (H) 04/28/2023   GLUCOSE 87 04/28/2023   GFRNONAA 39 (L) 04/28/2023   GFRAA 55 (L) 01/23/2020   CALCIUM 8.3 (L) 04/28/2023   PHOS 3.4 04/28/2023   PROT 5.4 (L) 04/24/2023   ALBUMIN 2.4 (L) 04/24/2023   LABGLOB 1.8 01/23/2020   AGRATIO 2.3 (H) 01/23/2020   BILITOT 0.7 04/24/2023   ALKPHOS 74 04/24/2023   AST 17 04/24/2023   ALT 15 04/24/2023   ANIONGAP 7 04/28/2023    CBG (last 3)  No results for input(s): "GLUCAP" in the last 72 hours.    Coagulation Profile: Recent Labs  Lab 04/24/23 1942  INR 1.4*     Radiology Studies: No results found.     Kathlen Mody M.D. [facility] 04/28/2023, 3:30 PM  Available via Epic secure chat 7am-7pm After 7 pm, please refer to night coverage provider listed on amion.

## 2023-04-28 NOTE — Progress Notes (Signed)
Patient's son Luisa Hart updated.

## 2023-04-28 NOTE — Progress Notes (Signed)
Physical Therapy Treatment Patient Details Name: Tanner [name] MRN: [id number] DOB: 17-Dec-1945 Today's Date: 04/28/2023   History of Present Illness The pt is a 77 yo male presenting from [facility] with AMS and acute hypoxic resp failure due to multifocal PNA. PMH includes: CHF, afib, PE on Eliquis, DM II, C. difficile, CKD 3B, morbid obesity, L TKR 2022, R TKR 12/15/2022, and admission 04/06/23 for RLE cellulitis with I&D with poly exchange on 04/07/23.    PT Comments  Pt received in recliner, agreeable to therapy session and with good participation and tolerance for transfer and pre-gait training at bedside. Pt has been NPO all day and c/o fatigue so trembling and buckling in BLE, unable to progress to household distance gait trial today. Pt needing up to minA with increased time to perform transfers and pre-gait tasks with heavy UE reliance on RW support. Pt continues to benefit from PT services to progress toward functional mobility goals.    If plan is discharge home, recommend the following: Assistance with cooking/housework;Direct supervision/assist for medications management;Direct supervision/assist for financial management;Assist for transportation;Help with stairs or ramp for entrance;A little help with walking and/or transfers;A little help with bathing/dressing/bathroom   Can travel by private vehicle     No  Equipment Recommendations  None recommended by PT    Recommendations for Other Services       Precautions / Restrictions Precautions Precautions: Fall Precaution Comments: Contact precs; watch O2, hx of buckling Restrictions Weight Bearing Restrictions: No     Mobility  Bed Mobility Overal bed mobility: Needs Assistance Bed Mobility: Sit to Supine       Sit to supine: Contact guard assist, HOB elevated, Used rails   General bed mobility comments: Pt does well lifting BLE over EOB, using bed features    Transfers Overall transfer level: Needs  assistance Equipment used: Rolling walker (2 wheels) Transfers: Sit to/from Stand, Bed to chair/wheelchair/BSC Sit to Stand: Contact guard assist, Min assist   Step pivot transfers: Min assist       General transfer comment: minA initial sit<>stand from chair, CGA for second trial. STS also from EOB with minA and increased time/effort    Ambulation/Gait Ambulation/Gait assistance: Min assist Gait Distance (Feet): 4 Feet Assistive device: Rolling walker (2 wheels) Gait Pattern/deviations: Step-to pattern, Decreased stride length, Knee flexed in stance - right, Knee flexed in stance - left, Shuffle, Knees buckling     Pre-gait activities: standing hip flexion x10 reps ea General Gait Details: Deferred long distance due to BLE buckling/LE tremors and pt quick to fatigue. Standing pre-gait tasks and x2 bouts of 27ft with heavy BUE reliance on RW support prior to pt fatigue.   Stairs Stairs:  (not yet safe to attempt, pt buckling after a few steps at bedside)           Wheelchair Mobility     Tilt Bed    Modified Rankin (Stroke Patients Only)       Balance Overall balance assessment: Needs assistance Sitting-balance support: No upper extremity supported, Feet supported Sitting balance-Leahy Scale: Good Sitting balance - Comments: able to sit forward in chair without significant instability   Standing balance support: Bilateral upper extremity supported, During functional activity, Reliant on assistive device for balance Standing balance-Leahy Scale: Poor Standing balance comment: reliant on RW support                            Cognition Arousal: Alert Behavior  During Therapy: WFL for tasks assessed/performed Overall Cognitive Status: Difficult to assess Area of Impairment: Problem solving                       Following Commands: Follows one step commands consistently Safety/Judgement: Decreased awareness of safety   Problem Solving:  Requires verbal cues General Comments: Pt with baseline dysarthria and can be difficult to understand. PTA assisted him to get cream for dentures and pt still difficult to understand with dentures in. Pt making jokes and appears fully oriented but did not prompt him for date/time today.        Exercises General Exercises - Lower Extremity Long Arc Quad: AROM, Seated, 10 reps, Both Hip Flexion/Marching: AROM, Both, Standing, 20 reps (10 reps x2 sets)    General Comments General comments (skin integrity, edema, etc.): Pt c/o back itching and small red rash on some parts of his back, RN notified. BP 124/54 HR 78 bpm recliner prior to transfer to standing; BP 117/53 (68) seated EOB post-exertion; SpO2 95% on 2L O2 Noonday at end of session.      Pertinent Vitals/Pain Pain Assessment Pain Assessment: Faces Faces Pain Scale: Hurts a little bit Pain Location: pt does not localize, intermittent grimacing Pain Descriptors / Indicators: Grimacing Pain Intervention(s): Limited activity within patient's tolerance, Monitored during session, Repositioned     PT Goals (current goals can now be found in the care plan section) Acute Rehab PT Goals Patient Stated Goal: to feel stronger so I can go home by myself (pt lives alone) PT Goal Formulation: With patient Time For Goal Achievement: 05/09/23 Progress towards PT goals: Progressing toward goals    Frequency    Min 1X/week      PT Plan         AM-PAC PT "6 Clicks" Mobility   Outcome Measure  Help needed turning from your back to your side while in a flat bed without using bedrails?: A Little Help needed moving from lying on your back to sitting on the side of a flat bed without using bedrails?: A Little Help needed moving to and from a bed to a chair (including a wheelchair)?: A Little Help needed standing up from a chair using your arms (e.g., wheelchair or bedside chair)?: A Little Help needed to walk in hospital room?: Total  (<38ft) Help needed climbing 3-5 steps with a railing? : Total 6 Click Score: 14    End of Session Equipment Utilized During Treatment: Gait belt;Oxygen Activity Tolerance: Patient tolerated treatment well;Patient limited by fatigue;Other (comment) (pt has not yet eaten due to cancelled procedure, nursing staff aware and per RN pt has diet order now, can eat) Patient left: in bed;with call bell/phone within reach;with bed alarm set;Other (comment) (pt heels floated; pt awaiting lunch so HOB elevated) Nurse Communication: Mobility status;Other (comment) (c/o itching/rash on back) PT Visit Diagnosis: Other abnormalities of gait and mobility (R26.89);Muscle weakness (generalized) (M62.81);Repeated falls (R29.6)     Time: 1540-1616 PT Time Calculation (min) (ACUTE ONLY): 36 min  Charges:    $Therapeutic Exercise: 8-22 mins $Therapeutic Activity: 8-22 mins PT General Charges $$ ACUTE PT VISIT: 1 Visit                     Shajuana Mclucas P., PTA Acute Rehabilitation Services Secure Chat Preferred 9a-5:30pm Office: [phone]    [facility] 04/28/2023, 5:44 PM

## 2023-04-28 NOTE — Progress Notes (Signed)
Patient's bronch canceled for today due to pt not being NPO.  Patient to be rescheduled for tomorrow at 0730.    Roselie Awkward, RN

## 2023-04-28 NOTE — Progress Notes (Signed)
Bronchoscopy scheduled for 1315 today

## 2023-04-28 NOTE — Progress Notes (Signed)
[facility]  Date of Admission:  04/24/2023     Total days of antibiotics 22         ASSESSMENT: [name] presented with multifocal pneumonia after recent admission for polymicrobial (E. Faecalis, MRSA, and strep species) bacteremia and right prosthetic joint infection which was treated with daptomycin and now concern for possible eosinophilic pneumonia and was changed to vancomycin   Renal function fluctuating since admission with creatinine today 1.75 which is down from 1.96 yesterday. There is concern that he will not tolerate vancomycin until 12/4 when the course is complete. Will keep him on vanc as long as renal function is acceptable and then switch to linezolid if needed. Platelets are stable at 204. Awaiting bronchoscopy for further evaluation. If bronch shows eosinophilic pneumonia with no HCAP, can d/c cefepime which will continue for now. WBC decreasing at 9.9. He remains afebrile. Blood cultures from 11/9 remain no growth.   PLAN:  Continue vanc and cefepime Await bronch results Monitor renal function and vanc levels.  Monitor blood cultures for growth  I have personally spent 26 minutes involved in face-to-face and non-face-to-face activities for this patient on the day of the visit. Professional time spent includes the following activities: Preparing to see the patient (review of tests), Obtaining and/or reviewing separately obtained history (admission/discharge record), Performing a medically appropriate examination and/or evaluation , Ordering medications/tests/procedures, referring and communicating with other health care professionals, Documenting clinical information in the EMR, Independently interpreting results (not separately reported), Communicating results to the patient/family/caregiver, Counseling and educating the patient/family/caregiver and Care coordination (not separately reported).   Principal Problem:   Acute hypoxic respiratory  failure (HCC) Active Problems:   Essential hypertension   History of pulmonary embolus (PE)   Morbid obesity (HCC)   DM2 (diabetes mellitus, type 2) (HCC)   Multifocal pneumonia   Chronic systolic CHF (congestive heart failure) (HCC)   Sepsis (HCC)   Acute metabolic encephalopathy   History of MRSA infection   Lung nodule    apixaban  5 mg Oral BID   buPROPion  150 mg Oral BID   carvedilol  6.25 mg Oral BID WC   Chlorhexidine Gluconate Cloth  6 each Topical Daily   finasteride  5 mg Oral QHS   gabapentin  600 mg Oral TID   loratadine  10 mg Oral Daily   mupirocin ointment  1 Application Nasal BID   nortriptyline  20 mg Oral QHS   pantoprazole  40 mg Oral Q1200   polyethylene glycol  17 g Oral Daily   senna  2 tablet Oral QHS   sodium chloride flush  10-40 mL Intracatheter Q12H   torsemide  20 mg Oral Daily    SUBJECTIVE: Laying in bed, no complaints. Feels like his breathing is improved.   Allergies  Allergen Reactions   Lisinopril Other (See Comments)     Renal impairment   Amlodipine Swelling   Codeine Sulfate Itching and Nausea Only     Review of Systems: Review of Systems  Constitutional:  Negative for chills and fever.  Respiratory:  Positive for cough.   Cardiovascular: Negative.   Gastrointestinal: Negative.   Skin:  Negative for rash.  Neurological: Negative.       OBJECTIVE: Vitals:   04/27/23 1537 04/27/23 2032 04/28/23 0337 04/28/23 0736  BP: 136/67 (!) 108/51 110/65 121/75  Pulse: 78 74 74 80  Resp: 17 17 17 17   Temp:  98.2 F (36.8 C) 97.9 F (  36.6 C) 97.6 F (36.4 C)  TempSrc:      SpO2: 96% 94% 93% 95%  Weight:      Height:       Body mass index is 37.99 kg/m.  Physical Exam Cardiovascular:     Rate and Rhythm: Normal rate.  Pulmonary:     Effort: Pulmonary effort is normal. No respiratory distress.  Abdominal:     General: Abdomen is flat.     Palpations: Abdomen is soft.  Musculoskeletal:        General: No swelling or  tenderness.  Skin:    General: Skin is warm and dry.     Findings: No rash.  Neurological:     General: No focal deficit present.     Mental Status: He is alert and oriented to person, place, and time.     Lab Results Lab Results  Component Value Date   WBC 9.9 04/28/2023   HGB 8.7 (L) 04/28/2023   HCT 29.7 (L) 04/28/2023   MCV 82.0 04/28/2023   PLT 204 04/28/2023    Lab Results  Component Value Date   CREATININE 1.75 (H) 04/28/2023   BUN 21 04/28/2023   NA 136 04/28/2023   K 4.1 04/28/2023   CL 102 04/28/2023   CO2 27 04/28/2023    Lab Results  Component Value Date   ALT 15 04/24/2023   AST 17 04/24/2023   ALKPHOS 74 04/24/2023   BILITOT 0.7 04/24/2023     Microbiology: Recent Results (from the past 240 hour(s))  Culture, blood (Routine x 2)     Status: None (Preliminary result)   Collection Time: 04/24/23  8:33 PM   Specimen: BLOOD LEFT HAND  Result Value Ref Range Status   Specimen Description BLOOD LEFT HAND  Final   Special Requests   Final    BOTTLES DRAWN AEROBIC AND ANAEROBIC Blood Culture adequate volume   Culture   Final    NO GROWTH 4 DAYS Performed at [facility], [address] [place], [place] [address]    Report Status PENDING  Incomplete  Culture, blood (Routine x 2)     Status: None (Preliminary result)   Collection Time: 04/24/23  9:10 PM   Specimen: BLOOD  Result Value Ref Range Status   Specimen Description BLOOD LEFT ANTECUBITAL  Final   Special Requests   Final    BOTTLES DRAWN AEROBIC AND ANAEROBIC Blood Culture adequate volume   Culture   Final    NO GROWTH 4 DAYS Performed at [facility], [address], [place], [place] [address]    Report Status PENDING  Incomplete  MRSA Next Gen by PCR, Nasal     Status: Abnormal   Collection Time: 04/26/23 11:07 AM   Specimen: Nasal Mucosa; Nasal Swab  Result Value Ref Range Status   MRSA by PCR Next Gen DETECTED (A) NOT DETECTED Final    Comment: RESULT CALLED TO, READ  BACK BY AND VERIFIED WITH: RN D MOSSICT [id number] AT 1204 BY CM (NOTE) The GeneXpert MRSA Assay (FDA approved for NASAL specimens only), is one component of a comprehensive MRSA colonization surveillance program. It is not intended to diagnose MRSA infection nor to guide or monitor treatment for MRSA infections. Test performance is not FDA approved in patients less than 25 years old. Performed at [facility], [address], [place], [place] [address]     Deniece Ree RN, Student Nurse Practitioner

## 2023-04-28 NOTE — Plan of Care (Signed)
  Problem: Education: Goal: Knowledge of General Education information will improve Description: Including pain rating scale, medication(s)/side effects and non-pharmacologic comfort measures Outcome: Progressing   Problem: Health Behavior/Discharge Planning: Goal: Ability to manage health-related needs will improve Outcome: Progressing   Problem: Clinical Measurements: Goal: Ability to maintain clinical measurements within normal limits will improve Outcome: Progressing   Problem: Activity: Goal: Risk for activity intolerance will decrease Outcome: Progressing   Problem: Nutrition: Goal: Adequate nutrition will be maintained Outcome: Progressing   Problem: Coping: Goal: Level of anxiety will decrease Outcome: Progressing   Problem: Elimination: Goal: Will not experience complications related to bowel motility Outcome: Progressing Goal: Will not experience complications related to urinary retention Outcome: Progressing   Problem: Pain Management: Goal: General experience of comfort will improve Outcome: Progressing   Problem: Safety: Goal: Ability to remain free from injury will improve Outcome: Progressing   Problem: Skin Integrity: Goal: Risk for impaired skin integrity will decrease Outcome: Progressing

## 2023-04-29 ENCOUNTER — Inpatient Hospital Stay (HOSPITAL_COMMUNITY): Payer: No Typology Code available for payment source | Admitting: Anesthesiology

## 2023-04-29 ENCOUNTER — Encounter (HOSPITAL_COMMUNITY)
Admission: EM | Disposition: A | Discharge status: Skilled Nursing Facility | Payer: Self-pay | Source: Skilled Nursing Facility | Attending: Internal Medicine

## 2023-04-29 ENCOUNTER — Ambulatory Visit (HOSPITAL_COMMUNITY): Payer: Self-pay

## 2023-04-29 DIAGNOSIS — J189 Pneumonia, unspecified organism: Secondary | ICD-10-CM

## 2023-04-29 DIAGNOSIS — F338 Other recurrent depressive disorders: Secondary | ICD-10-CM

## 2023-04-29 HISTORY — PX: BRONCHIAL WASHINGS: SHX5105

## 2023-04-29 HISTORY — PX: VIDEO BRONCHOSCOPY: SHX5072

## 2023-04-29 LAB — GLUCOSE, CAPILLARY: Glucose-Capillary: 89 mg/dL (ref 70–99)

## 2023-04-29 LAB — BODY FLUID CELL COUNT WITH DIFFERENTIAL
Eos, Fluid: 3 %
Lymphs, Fluid: 1 %
Monocyte-Macrophage-Serous Fluid: 4 % — ABNORMAL LOW (ref 50–90)
Neutrophil Count, Fluid: 92 % — ABNORMAL HIGH (ref 0–25)
Total Nucleated Cell Count, Fluid: 680 uL (ref 0–1000)

## 2023-04-29 LAB — CULTURE, BLOOD (ROUTINE X 2)
Culture: NO GROWTH
Culture: NO GROWTH
Special Requests: ADEQUATE
Special Requests: ADEQUATE

## 2023-04-29 LAB — VANCOMYCIN, RANDOM: Vancomycin Rm: 19 ug/mL

## 2023-04-29 LAB — VANCOMYCIN, PEAK: Vancomycin Pk: 31 ug/mL (ref 30–40)

## 2023-04-29 SURGERY — VIDEO BRONCHOSCOPY WITHOUT FLUORO
Anesthesia: General | Laterality: Bilateral

## 2023-04-29 MED ORDER — ALBUTEROL SULFATE (2.5 MG/3ML) 0.083% IN NEBU
INHALATION_SOLUTION | RESPIRATORY_TRACT | Status: AC
  Filled 2023-04-29: qty 3

## 2023-04-29 MED ORDER — PROPOFOL 500 MG/50ML IV EMUL
INTRAVENOUS | Status: DC | PRN
  Administered 2023-04-29: 50 ug/kg/min via INTRAVENOUS

## 2023-04-29 MED ORDER — ONDANSETRON HCL 4 MG/2ML IJ SOLN
INTRAMUSCULAR | Status: DC | PRN
  Administered 2023-04-29: 4 mg via INTRAVENOUS

## 2023-04-29 MED ORDER — PROPOFOL 10 MG/ML IV BOLUS
INTRAVENOUS | Status: DC | PRN
  Administered 2023-04-29: 130 mg via INTRAVENOUS

## 2023-04-29 MED ORDER — PHENYLEPHRINE HCL-NACL 20-0.9 MG/250ML-% IV SOLN
INTRAVENOUS | Status: DC | PRN
  Administered 2023-04-29: 10 ug/min via INTRAVENOUS

## 2023-04-29 MED ORDER — ROCURONIUM BROMIDE 10 MG/ML (PF) SYRINGE
PREFILLED_SYRINGE | INTRAVENOUS | Status: DC | PRN
  Administered 2023-04-29: 50 mg via INTRAVENOUS

## 2023-04-29 MED ORDER — SUGAMMADEX SODIUM 200 MG/2ML IV SOLN
INTRAVENOUS | Status: DC | PRN
  Administered 2023-04-29: 200 mg via INTRAVENOUS

## 2023-04-29 MED ORDER — DEXAMETHASONE SODIUM PHOSPHATE 10 MG/ML IJ SOLN
INTRAMUSCULAR | Status: DC | PRN
  Administered 2023-04-29: 4 mg via INTRAVENOUS

## 2023-04-29 MED ORDER — FENTANYL CITRATE (PF) 100 MCG/2ML IJ SOLN: 25.0000 ug | INTRAMUSCULAR | Status: DC | PRN

## 2023-04-29 MED ORDER — LIDOCAINE 2% (20 MG/ML) 5 ML SYRINGE
INTRAMUSCULAR | Status: DC | PRN
  Administered 2023-04-29: 50 mg via INTRAVENOUS

## 2023-04-29 MED ORDER — PHENYLEPHRINE 80 MCG/ML (10ML) SYRINGE FOR IV PUSH (FOR BLOOD PRESSURE SUPPORT)
PREFILLED_SYRINGE | INTRAVENOUS | Status: DC | PRN
  Administered 2023-04-29 (×2): 80 ug via INTRAVENOUS
  Administered 2023-04-29: 160 ug via INTRAVENOUS

## 2023-04-29 MED ORDER — OXYCODONE HCL 5 MG PO TABS: 5.0000 mg | ORAL_TABLET | Freq: Once | ORAL | Status: DC | PRN

## 2023-04-29 MED ORDER — OXYCODONE HCL 5 MG/5ML PO SOLN: 5.0000 mg | Freq: Once | ORAL | Status: DC | PRN

## 2023-04-29 MED ORDER — ONDANSETRON HCL 4 MG/2ML IJ SOLN: 4.0000 mg | Freq: Four times a day (QID) | INTRAMUSCULAR | Status: DC | PRN

## 2023-04-29 MED ORDER — ALBUTEROL SULFATE (2.5 MG/3ML) 0.083% IN NEBU
2.5000 mg | INHALATION_SOLUTION | Freq: Once | RESPIRATORY_TRACT | Status: AC
  Administered 2023-04-29: 2.5 mg via RESPIRATORY_TRACT

## 2023-04-29 MED ORDER — SODIUM CHLORIDE 0.9 % IV SOLN: INTRAVENOUS | Status: DC | PRN

## 2023-04-29 NOTE — Interval H&P Note (Signed)
History and Physical Interval Note:  04/29/2023 7:31 AM  Tanner [name]  has presented today for surgery, with the diagnosis of pneumonia.  The various methods of treatment have been discussed with the patient and family. After consideration of risks, benefits and other options for treatment, the patient has consented to  Procedure(s) with comments: VIDEO BRONCHOSCOPY WITHOUT FLUORO (Bilateral) - 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 as a surgical intervention.  [name] history has been reviewed, patient examined, no change in status, stable for surgery.  I have reviewed the patient's chart and labs.  Questions were answered to the patient's satisfaction.     Apolo Cutshaw A Aleksa Catterton

## 2023-04-29 NOTE — Progress Notes (Signed)
[facility]                                                                               Tanner [name], is a 77 y.o. male, DOB - 10/04/1945, WJX:[id number] Admit date - 04/24/2023    Outpatient Primary MD for the patient is [facility]  LOS - 5  days    Brief summary   77 y.o. male with medical history significant for chronic systolic heart failure, paroxysmal atrial fibrillation, history of PE on Eliquis, type 2 diabetes, C. difficile, CKD 3B, morbid obesity, recent bacteremia who presents with altered mental status and hypoxia. Patient was recently hospitalized from 10/22 to 10/30 for prosthetic right knee infection with MRSA/Enterococcus faecalis bacteremia s/p irrigation/debridement with poly exchange on 10/23.  He was followed by infectious disease and orthopedics.  He is to remain on IV daptomycin till 05/19/23 and then Doxy 100 mg twice daily indefinitely for suppressive therapy. He was found to be febrile with a temp of 101, hypoxia with SpO2 87% on 4 L.  Chest x-ray shows persistent bilateral lower lobe airspace opacities.  Findings could represent infection versus inflammation versus malignancy or atelectasis.  Patient is admitted for further evaluation.   Assessment & Plan    Assessment and Plan: * Acute hypoxic respiratory failure (HCC)/ Multifocal pneumonia.  Initially started the patient on IV vancomycin and cefepime.  ID consulted not on board PCCM consulted for evaluation of eosinophilic pneumonia from daptomycin and he underwent bronchoscopy today, and BAL sent for analysis for eosinophilic pneumonia vs daptomycin induced lung injury.  Patient remains afebrile and WBC count improving. ID recommends to continue with vancomycin EOT till 12/.4/24 followed by oral suppression .  Complete 7 days of cefepime pending bronchoscopy findings.  Blood cultures have been negative so far  Sepsis (HCC) - Presented with fever, tachycardia and leukocytosis  with finding of multifocal pneumonia on CT chest -Continue with antibiotics as per infectious disease  History of MRSA infection - Patient was recently hospitalized from 10/22 to 10/30 for prosthetic right knee infection with MRSA/Enterococcus faecalis bacteremia s/p irrigation/debridement with poly exchange on 10/23.  He was followed by infectious disease and orthopedics.  Patient was supposed to be on IV daptomycin till 12/4 and then Doxy 100 mg twice daily indefinitely for suppressive therapy. -ID consulted, PICC line changed.  Essential hypertension Blood pressure parameters are well controlled.   History of pulmonary embolus (PE) -continue Eliquis  Lung nodule -Right lung nodules measuring up to 6 mm. Non-contrast chest CT at 3-6 months is recommended. If the nodules are stable at time of repeat CT, then future CT at 18-24 months  Acute metabolic encephalopathy -secondary to sepsis and acute hypoxic respiratory failure secondary to multifocal pneumonia  -Patient appears to be back to baseline he is alert and oriented.  And answering all questions appropriately. - pt reports feeling weak.  PT/OT on board  Chronic systolic CHF (congestive heart failure) (HCC) -b/l LE edema on exam -continue Torsemide   DM2 (diabetes mellitus, type 2) (HCC) -controlled. A1C of 5.3 last month    Morbid obesity (HCC) BMI of 42  Anemia of chronic disease.  Hemoglobin stable around 8.  Hypokalemia Replaced and repeat level within normal limits   Stage IIIb CKD Creatinine appears to be at baseline at this time    Paroxysmal atrial fibrillation Patient is on Eliquis anticoagulation Continue with Coreg 6.25 mg twice daily  RN Pressure Injury Documentation: Pressure Injury 03/18/23 Buttocks Left;Right Stage 2 -  Partial thickness loss of dermis presenting as a shallow open injury with a red, pink wound bed without slough. (Active)  03/18/23 0629  Location: Buttocks  Location  Orientation: Left;Right  Staging: Stage 2 -  Partial thickness loss of dermis presenting as a shallow open injury with a red, pink wound bed without slough.  Wound Description (Comments):   Present on Admission: Yes  Dressing Type Foam - Lift dressing to assess site every shift 04/27/23 2013      Estimated body mass index is 37.99 kg/m as calculated from the following:   Height as of this encounter: 5\' 4"  (1.626 m).   Weight as of this encounter: 100.4 kg.  Code Status: full code.  DVT Prophylaxis:   apixaban (ELIQUIS) tablet 5 mg   Level of Care: Level of care: Telemetry Medical Family Communication: none at bedside.   Disposition Plan:     Remains inpatient appropriate:  awaiting BAL results.   Procedures:  bRONCHOSCOPY Consultants:   PCCM ED  Antimicrobials:   Anti-infectives (From admission, onward)    Start     Dose/Rate Route Frequency Ordered Stop   04/27/23 2000  vancomycin (VANCOCIN) IVPB 1000 mg/200 mL premix  Status:  Discontinued        1,000 mg 200 mL/hr over 60 Minutes Intravenous Every 24 hours 04/27/23 0821 04/27/23 1225   04/27/23 2000  vancomycin (VANCOREADY) IVPB 750 mg/150 mL        750 mg 150 mL/hr over 60 Minutes Intravenous Every 24 hours 04/27/23 1225     04/25/23 2000  vancomycin (VANCOREADY) IVPB 750 mg/150 mL  Status:  Discontinued        750 mg 150 mL/hr over 60 Minutes Intravenous Every 24 hours 04/25/23 0033 04/27/23 0821   04/25/23 0800  ceFEPIme (MAXIPIME) 2 g in sodium chloride 0.9 % 100 mL IVPB        2 g 200 mL/hr over 30 Minutes Intravenous Every 12 hours 04/25/23 0033 05/02/23 0759   04/24/23 2000  metroNIDAZOLE (FLAGYL) IVPB 500 mg        500 mg 100 mL/hr over 60 Minutes Intravenous  Once 04/24/23 1950 04/24/23 2314   04/24/23 2000  ceFEPIme (MAXIPIME) 2 g in sodium chloride 0.9 % 100 mL IVPB        2 g 200 mL/hr over 30 Minutes Intravenous  Once 04/24/23 1956 04/24/23 2158   04/24/23 2000  vancomycin (VANCOREADY) IVPB 2000  mg/400 mL        2,000 mg 200 mL/hr over 120 Minutes Intravenous  Once 04/24/23 1956 04/25/23 0043        Medications  Scheduled Meds:  apixaban  5 mg Oral BID   buPROPion  150 mg Oral BID   carvedilol  6.25 mg Oral BID WC   Chlorhexidine Gluconate Cloth  6 each Topical Daily   finasteride  5 mg Oral QHS   gabapentin  600 mg Oral TID   loratadine  10 mg Oral Daily   mupirocin ointment  1 Application Nasal BID   nortriptyline  20 mg Oral QHS   pantoprazole  40 mg Oral Q1200   polyethylene glycol  17 g Oral Daily  senna  2 tablet Oral QHS   sodium chloride flush  10-40 mL Intracatheter Q12H   torsemide  20 mg Oral Daily   Continuous Infusions:  ceFEPime (MAXIPIME) IV 2 g (04/29/23 0958)   vancomycin 750 mg (04/28/23 2050)   PRN Meds:.diphenhydrAMINE, sodium chloride flush    Subjective:   Tanner [name] was seen and examined today.  No new complaints.   Objective:   Vitals:   04/29/23 0845 04/29/23 0855 04/29/23 0936 04/29/23 1346  BP: (!) 111/47  (!) 110/51 (!) 117/54  Pulse: 81 78 80 76  Resp: 20 18 16 17   Temp:  97.7 F (36.5 C) 98 F (36.7 C) 98 F (36.7 C)  TempSrc:   Oral Oral  SpO2: 98% 94% 94% 93%  Weight:      Height:        Intake/Output Summary (Last 24 hours) at 04/29/2023 1436 Last data filed at 04/29/2023 0809 Gross per 24 hour  Intake 150 ml  Output 1500 ml  Net -1350 ml   Filed Weights   04/24/23 2122 04/25/23 0210  Weight: 111 kg 100.4 kg     Exam General exam: Appears calm and comfortable  Respiratory system: Clear to auscultation. Respiratory effort normal. Cardiovascular system: S1 & S2 heard, RRR. No JVD, Gastrointestinal system: Abdomen is nondistended, soft and nontender.  Central nervous system: Alert and oriented.  Extremities: Pedal edema present Skin: No rashes,  Psychiatry: Mood & affect appropriate.     Data Reviewed:  I have personally reviewed following labs and imaging studies   CBC Lab Results   Component Value Date   WBC 9.9 04/28/2023   RBC 3.62 (L) 04/28/2023   HGB 8.7 (L) 04/28/2023   HCT 29.7 (L) 04/28/2023   MCV 82.0 04/28/2023   MCH 24.0 (L) 04/28/2023   PLT 204 04/28/2023   MCHC 29.3 (L) 04/28/2023   RDW 19.0 (H) 04/28/2023   LYMPHSABS 0.6 (L) 04/24/2023   MONOABS 0.6 04/24/2023   EOSABS 0.2 04/24/2023   BASOSABS 0.1 04/24/2023     Last metabolic panel Lab Results  Component Value Date   NA 136 04/28/2023   K 4.1 04/28/2023   CL 102 04/28/2023   CO2 27 04/28/2023   BUN 21 04/28/2023   CREATININE 1.75 (H) 04/28/2023   GLUCOSE 87 04/28/2023   GFRNONAA 39 (L) 04/28/2023   GFRAA 55 (L) 01/23/2020   CALCIUM 8.3 (L) 04/28/2023   PHOS 3.4 04/28/2023   PROT 5.4 (L) 04/24/2023   ALBUMIN 2.4 (L) 04/24/2023   LABGLOB 1.8 01/23/2020   AGRATIO 2.3 (H) 01/23/2020   BILITOT 0.7 04/24/2023   ALKPHOS 74 04/24/2023   AST 17 04/24/2023   ALT 15 04/24/2023   ANIONGAP 7 04/28/2023    CBG (last 3)  Recent Labs    04/29/23 0819  GLUCAP 89      Coagulation Profile: Recent Labs  Lab 04/24/23 1942  INR 1.4*     Radiology Studies: No results found.     Kathlen Mody M.D. [facility] 04/29/2023, 2:36 PM  Available via Epic secure chat 7am-7pm After 7 pm, please refer to night coverage provider listed on amion.

## 2023-04-29 NOTE — Consult Note (Signed)
[facility] Liaison Consult Note    04/29/2023  Tanner [name] [birth year] [id number]  [facility] Patient:  Primary Care Provider:  [facility],  this provider is listed to provide the community transition of care follow up and TOC calls  Insurance: [name] Medicare  Patient had been active with [facility] for care coordination services.  Patient has been assigned to be  engaged by a [employer].  The community based plan of care was to focused on disease management and community resource support.  Patient is noted as extreme high risk for unplanned readmission risk and returned to hospital from [facility] rehab. Patient has been recommended to return to SNF for ST rehab and this would need insurance authorization.  Patient has ongoing medical follow up needs.  Plan: Continue to follow with inpatient [facility] team for any additional community care coordination needs for post hospital/community needs.Pending disposition. ongoing     Of note, [facility] services does not replace or interfere with any services that are needed or arranged by inpatient [facility] care management team.   Charlesetta Shanks, RN, BSN, CCM [place]  [facility] Liaison Direct Dial: [phone] or secure chat Email: [name]

## 2023-04-29 NOTE — Anesthesia Procedure Notes (Signed)
Procedure Name: Intubation Date/Time: 04/29/2023 7:48 AM  Performed by: Camillia Herter, CRNAPre-anesthesia Checklist: Patient identified, Emergency Drugs available, Suction available and Patient being monitored Patient Re-evaluated:Patient Re-evaluated prior to induction Oxygen Delivery Method: Circle System Utilized Preoxygenation: Pre-oxygenation with 100% oxygen Induction Type: IV induction Ventilation: Mask ventilation without difficulty Laryngoscope Size: Miller and 3 Grade View: Grade I Tube type: Oral Tube size: 8.0 mm Number of attempts: 1 Airway Equipment and Method: Stylet Placement Confirmation: ETT inserted through vocal cords under direct vision, positive ETCO2 and breath sounds checked- equal and bilateral Secured at: 23 cm Tube secured with: Tape Dental Injury: Teeth and Oropharynx as per pre-operative assessment

## 2023-04-29 NOTE — Progress Notes (Signed)
[facility]  Date of Admission:  04/24/2023     Total days of antibiotics 23         ASSESSMENT:  Mr. Otter's bronchoscopy showed cell count with neutrophilia and no significant eosinophilia in the setting of suspected eosinophilic pneumonia related to daptomycin versus other bacterial pneumonia during treatment of of MRSA/Enterococcal prosthetic joint infection. Respiratory and AFB cultures are pending. Does not appear to be eosinophilic pneumonia. Discussed plan of care to continue with current dose of vancomycin and cefepime pending additional culture results. Surgical incision continues to look good with no evidence of infection. Remaining medical and supportive care per Internal Medicine.   PLAN:  Continue current dose of vancomycin and cefepime.  Monitor cultures for organisms.  Therapeutic drug monitoring of renal function and vancomycin levels. Remaining medical and supportive care per Internal Medicine.   I have personally spent 28 minutes involved in face-to-face and non-face-to-face activities for this patient on the day of the visit. Professional time spent includes the following activities: Preparing to see the patient (review of tests), Obtaining and/or reviewing separately obtained history (admission/discharge record), Performing a medically appropriate examination and/or evaluation , Ordering medications/tests/procedures, referring and communicating with other health care professionals, Documenting clinical information in the EMR, Independently interpreting results (not separately reported), Communicating results to the patient/family/caregiver, Counseling and educating the patient/family/caregiver and Care coordination (not separately reported).    Principal Problem:   Acute hypoxic respiratory failure (HCC) Active Problems:   Essential hypertension   History of pulmonary embolus (PE)   Morbid obesity (HCC)   DM2 (diabetes mellitus, type 2)  (HCC)   Multifocal pneumonia   Chronic systolic CHF (congestive heart failure) (HCC)   Sepsis (HCC)   Acute metabolic encephalopathy   History of MRSA infection   Lung nodule    apixaban  5 mg Oral BID   buPROPion  150 mg Oral BID   carvedilol  6.25 mg Oral BID WC   Chlorhexidine Gluconate Cloth  6 each Topical Daily   finasteride  5 mg Oral QHS   gabapentin  600 mg Oral TID   loratadine  10 mg Oral Daily   mupirocin ointment  1 Application Nasal BID   nortriptyline  20 mg Oral QHS   pantoprazole  40 mg Oral Q1200   polyethylene glycol  17 g Oral Daily   senna  2 tablet Oral QHS   sodium chloride flush  10-40 mL Intracatheter Q12H   torsemide  20 mg Oral Daily    SUBJECTIVE:  Afebrile overnight with no acute events. Feeling about the same s/p bronchoscopy.   Allergies  Allergen Reactions   Lisinopril Other (See Comments)     Renal impairment   Amlodipine Swelling   Codeine Sulfate Itching and Nausea Only     Review of Systems: Review of Systems  Constitutional:  Negative for chills, fever and weight loss.  Respiratory:  Positive for cough. Negative for shortness of breath and wheezing.   Cardiovascular:  Negative for chest pain and leg swelling.  Gastrointestinal:  Negative for abdominal pain, constipation, diarrhea, nausea and vomiting.  Skin:  Negative for rash.      OBJECTIVE: Vitals:   04/29/23 0845 04/29/23 0855 04/29/23 0936 04/29/23 1346  BP: (!) 111/47  (!) 110/51 (!) 117/54  Pulse: 81 78 80 76  Resp: 20 18 16 17   Temp:  97.7 F (36.5 C) 98 F (36.7 C) 98 F (36.7 C)  TempSrc:   Oral Oral  SpO2: 98% 94% 94% 93%  Weight:      Height:       Body mass index is 37.99 kg/m.  Physical Exam Constitutional:      General: He is not in acute distress.    Appearance: He is well-developed.     Interventions: Nasal cannula in place.     Comments: Lying in bed with head of bed elevated; pleasant.   Cardiovascular:     Rate and Rhythm: Normal rate  and regular rhythm.     Heart sounds: Normal heart sounds.  Pulmonary:     Effort: Pulmonary effort is normal.     Breath sounds: Normal breath sounds.  Skin:    General: Skin is warm and dry.  Neurological:     Mental Status: He is alert.     Lab Results Lab Results  Component Value Date   WBC 9.9 04/28/2023   HGB 8.7 (L) 04/28/2023   HCT 29.7 (L) 04/28/2023   MCV 82.0 04/28/2023   PLT 204 04/28/2023    Lab Results  Component Value Date   CREATININE 1.75 (H) 04/28/2023   BUN 21 04/28/2023   NA 136 04/28/2023   K 4.1 04/28/2023   CL 102 04/28/2023   CO2 27 04/28/2023    Lab Results  Component Value Date   ALT 15 04/24/2023   AST 17 04/24/2023   ALKPHOS 74 04/24/2023   BILITOT 0.7 04/24/2023     Microbiology: Recent Results (from the past 240 hour(s))  Culture, blood (Routine x 2)     Status: None   Collection Time: 04/24/23  8:33 PM   Specimen: BLOOD LEFT HAND  Result Value Ref Range Status   Specimen Description BLOOD LEFT HAND  Final   Special Requests   Final    BOTTLES DRAWN AEROBIC AND ANAEROBIC Blood Culture adequate volume   Culture   Final    NO GROWTH 5 DAYS Performed at [facility], [address] [place], [place] [address]    Report Status 04/29/2023 FINAL  Final  Culture, blood (Routine x 2)     Status: None   Collection Time: 04/24/23  9:10 PM   Specimen: BLOOD  Result Value Ref Range Status   Specimen Description BLOOD LEFT ANTECUBITAL  Final   Special Requests   Final    BOTTLES DRAWN AEROBIC AND ANAEROBIC Blood Culture adequate volume   Culture   Final    NO GROWTH 5 DAYS Performed at [facility], [address], [place], [place] [address]    Report Status 04/29/2023 FINAL  Final  MRSA Next Gen by PCR, Nasal     Status: Abnormal   Collection Time: 04/26/23 11:07 AM   Specimen: Nasal Mucosa; Nasal Swab  Result Value Ref Range Status   MRSA by PCR Next Gen DETECTED (A) NOT DETECTED Final    Comment: RESULT CALLED TO,  READ BACK BY AND VERIFIED WITH: RN D MOSSICT [id number] AT 1204 BY CM (NOTE) The GeneXpert MRSA Assay (FDA approved for NASAL specimens only), is one component of a comprehensive MRSA colonization surveillance program. It is not intended to diagnose MRSA infection nor to guide or monitor treatment for MRSA infections. Test performance is not FDA approved in patients less than 47 years old. Performed at [facility], [address], [place], [place] [address]      Marcos Eke, NP [facility]  04/29/2023  3:11 PM

## 2023-04-29 NOTE — Anesthesia Preprocedure Evaluation (Signed)
Anesthesia Evaluation  Patient identified by MRN, date of birth, ID band Patient awake    Reviewed: Allergy & Precautions, H&P , NPO status , Patient's Chart, lab work & pertinent test results  Airway Mallampati: II   Neck ROM: full    Dental   Pulmonary sleep apnea , pneumonia, unresolved, former smoker   breath sounds clear to auscultation       Cardiovascular hypertension, +CHF and + DVT   Rhythm:regular Rate:Normal     Neuro/Psych  PSYCHIATRIC DISORDERS Anxiety Depression       GI/Hepatic   Endo/Other  diabetes, Type 2    Renal/GU Renal InsufficiencyRenal disease     Musculoskeletal  (+) Arthritis ,    Abdominal   Peds  Hematology  (+) Blood dyscrasia, anemia   Anesthesia Other Findings   Reproductive/Obstetrics                             Anesthesia Physical Anesthesia Plan  ASA: 3  Anesthesia Plan: General   Post-op Pain Management:    Induction: Intravenous  PONV Risk Score and Plan: 2 and Ondansetron, Dexamethasone and Treatment may vary due to age or medical condition  Airway Management Planned: Oral ETT  Additional Equipment:   Intra-op Plan:   Post-operative Plan: Extubation in OR  Informed Consent: I have reviewed the patients History and Physical, chart, labs and discussed the procedure including the risks, benefits and alternatives for the proposed anesthesia with the patient or authorized representative who has indicated his/her understanding and acceptance.     Dental advisory given  Plan Discussed with: CRNA, Anesthesiologist and Surgeon  Anesthesia Plan Comments:        Anesthesia Quick Evaluation

## 2023-04-29 NOTE — Progress Notes (Signed)
   NAME:  Tanner [name], MRN:  [id number], DOB:  1945-07-04, LOS: 5 ADMISSION DATE:  04/24/2023, CONSULTATION DATE: 04/27/2023 REFERRING MD: Dr. Idelle Leech, CHIEF COMPLAINT: Pneumonia  History of Present Illness:  77 year old male with extensive past medical history including CHF, HTN, HLD, paroxysmal atrial fibrillation, PE/DVT on Eliquis, diabetes, c.difficile, CKD 3, obesity with recent admission for AKI and polymicrobial bacteremia (MRSA, e. Faecalis) from periprosthetic right knee infection, discharged on daptomycin who presented to the ED on 11/9 with altered mental status. Per EDP note, presented with two days of cough and altered mental status. Son prsented with patient and noted he had fallen three times in the three days prior to presentation. Son also noted him falling asleep during conversation, not acting himself. In the ED, noted to be febrile to 101, O2 sat 87% on room air. Labs notable for WBC 11.2, Cr 1.72, BNP 174.5. CXR with bilateral lower lobe opacities. CT head negative. CT chest with multifocal GGO consistent with multifocal pneumonia.   Pertinent  Medical History  CHF, HTN, HLD, paroxysmal atrial fibrillation, PE/DVT on Eliquis, diabetes, c.difficile, CKD 3, obesity    Significant Hospital Events: Including procedures, antibiotic start and stop dates in addition to other pertinent events   11/9: admitted with multifocal pneumonia  11/10: on vanc/flagyl/cefepime, ID consulted recommended con't vanc/cefepime, remove picc, ?daptomycin related pneumonia  11/11: consult pulm for ?steroids vs other for possible dapto induced eosinophilic pna  11/14: Tolerated bronchoscopy well  Interim History / Subjective:  Post bronchoscopy, feels fine  Objective   Blood pressure (!) 129/99, pulse 91, temperature (!) 97.2 F (36.2 C), temperature source Temporal, resp. rate 19, height 5\' 4"  (1.626 m), weight 100.4 kg, SpO2 92%.        Intake/Output Summary (Last 24 hours) at 04/29/2023  0815 Last data filed at 04/29/2023 4098 Gross per 24 hour  Intake --  Output 2300 ml  Net -2300 ml   Filed Weights   04/24/23 2122 04/25/23 0210  Weight: 111 kg 100.4 kg    Examination: General: Elderly, does not appear to be in distress HENT: Moist oral mucosa Lungs: Bilateral rhonchi Cardiovascular: S1-S2 appreciated Abdomen: Soft, bowel sounds appreciated Extremities: No clubbing, no edema Neuro: Alert and oriented x 3 GU: Thad Ranger output  [facility] Problem list     Assessment & Plan:   Acute hypoxemic respiratory failure -Continue oxygen supplementation  Multilobar pneumonia -Concern for daptomycin induced lung injury -Post bronchoscopy  Looking for eosinophilia in BAL fluid -Eosinophilic pneumonia usually with eosinophil counts in the high 20%'s -If no significant eosinophilia, infiltrate is not likely related to daptomycin -In that situation, will not need steroids  If significant eosinophilia, will need about 4 to 6 weeks of steroids -About 40 mg daily for about 5 to 7 days and then wean down to a low dose of about 10 mg daily to complete 6 weeks.  Follow-up on cultures from BAL  Will benefit from evaluation for silent aspirations  History of MRSA infection, hypertension, PE/DVT, diastolic heart failure, type 2 diabetes Rest of the management per primary service  Will continue to follow  Virl Diamond, MD [place] [facility] Pager: See Loretha Stapler

## 2023-04-29 NOTE — Transfer of Care (Signed)
Immediate Anesthesia Transfer of Care Note  Patient: Tanner [name]  Procedure(s) Performed: VIDEO BRONCHOSCOPY WITHOUT FLUORO (Bilateral) BRONCHIAL WASHINGS  Patient Location: PACU  Anesthesia Type:General  Level of Consciousness: drowsy  Airway & Oxygen Therapy: Patient Spontanous Breathing and Patient connected to nasal cannula oxygen  Post-op Assessment: Report given to RN and Post -op Vital signs reviewed and stable  Post vital signs: Reviewed and stable  Last Vitals:  Vitals Value Taken Time  BP 104/51 04/29/23 0830  Temp 36.5 C 04/29/23 0820  Pulse 81 04/29/23 0840  Resp 22 04/29/23 0840  SpO2 97 % 04/29/23 0840  Vitals shown include unfiled device data.  Last Pain:  Vitals:   04/29/23 0820  TempSrc:   PainSc: 0-No pain      Patients Stated Pain Goal: 0 (04/25/23 0903)  Complications: No notable events documented.

## 2023-04-29 NOTE — Progress Notes (Signed)
Pharmacy Antibiotic Note  Tanner [name] is a 77 y.o. male admitted on 04/24/2023 with altered mental status and hypoxia with multifocal pneumonia.  Pharmacy has been consulted for cefepime and vancomycin dosing.  PMH includes recent hospitalization for prosthetic knee infection with MRSA/enterococcus faecalis bacteremia s/p irrigation/debridement with poly exchange on 04/07/23. Patient previously on daptomycin until 05/19/2023 then to be converted to doxycycline 100mg  BID for suppressive therapy   WBC 11.2, sCr 1.72 >> 1.59 >> 1.96, lactate 1.4, Tmax 101 Blood cultures collected Given 1 dose of vancomycin 2g IV, cefepime 2g, and flagyl 500mg  IV in the ED  11/14 PM update:  Vancomycin AUC is therapeutic at 544 Scr 1.59>>1.96>>1.75  Plan: -Continue Vancomycin 750 mg IV q24h -Continue Cefepime 2g IV q12h -Watch Scr -Re-check vancomycin levels as needed  Height: 5\' 4"  (162.6 cm) Weight: 100.4 kg (221 lb 5.5 oz) IBW/kg (Calculated) : 59.2  Temp (24hrs), Avg:97.9 F (36.6 C), Min:97.2 F (36.2 C), Max:98.6 F (37 C)  Recent Labs  Lab 04/24/23 1942 04/24/23 1949 04/24/23 2127 04/25/23 0305 04/26/23 0406 04/27/23 0409 04/27/23 0500 04/28/23 0351 04/28/23 2054 04/29/23 1133 04/29/23 2309  WBC 11.2*  --   --  9.2 8.6 11.0*  --  9.9  --   --   --   CREATININE 1.72*  --   --  1.64* 1.59*  --  1.96* 1.75*  --   --   --   LATICACIDVEN  --  1.1 1.4  --   --   --   --   --   --   --   --   VANCOPEAK  --   --   --   --   --   --   --   --  15*  --  31  VANCORANDOM  --   --   --   --   --   --   --   --   --  19  --     Estimated Creatinine Clearance: 37.2 mL/min (A) (by C-G formula based on SCr of 1.75 mg/dL (H)).    Allergies  Allergen Reactions   Lisinopril Other (See Comments)     Renal impairment   Amlodipine Swelling   Codeine Sulfate Itching and Nausea Only    Antimicrobials this admission: Cefepime 11/9 >>  Flagyl 11/9 x 1 Vancomycin 11/9 >>  Microbiology  results: 10/24 bcx ngtdF 10/23 bcx MRSA 10/22 bcx e faecalis + MRSA, BCID w/ strep species (no vancomycin resistance) 10/22 knee cx MRSA 11/9 BCx: NG  Abran Duke, PharmD, BCPS Clinical Pharmacist Phone: [phone]

## 2023-04-29 NOTE — Progress Notes (Signed)
At 1330 LPN saw pt was trying to get up . Patient stated he needed to go to bath room. Pt assisted to bathroom with walker tried to turn to get to toilet seat and started to descend slowly. Lpn assisted pt to ground. Noticed a small skin tear to left elbow. Patient assisted back in bed by staff. NO complaints of pain at this time. MD notified. Orthostatic vitals were completed.  Tanner [name] pt son has been notified at this time. All safety precautions in place.

## 2023-04-29 NOTE — Progress Notes (Signed)
Mobility Specialist Progress Note:   04/29/23 0900  Mobility  Activity Transferred from bed to chair (WC>B)  Level of Assistance Minimal assist, patient does 75% or more  Assistive Device Front wheel walker  Distance Ambulated (ft) 5 ft  RLE Weight Bearing WBAT  Activity Response Tolerated well  Mobility Referral Yes  $Mobility charge 1 Mobility  Mobility Specialist Start Time (ACUTE ONLY) 0900  Mobility Specialist Stop Time (ACUTE ONLY) [id number]  Mobility Specialist Time Calculation (min) (ACUTE ONLY) 6 min   Pt returned to floor from procedure. Assisted with transferring pt WC>B. Required MinA with RW d/t pt's knees buckling. Pt sitting comfortably in chair, alarm on, call bell in reach, all needs met.   Feliciana Rossetti Mobility Specialist Please contact via Special educational needs teacher or  Rehab office at [phone]

## 2023-04-29 NOTE — Progress Notes (Signed)
No significant BAL eosinophilia  Unlikely to be eosinophilic pneumonia   Continue current management for pneumonia  Favor further evaluation for possible silent aspirations

## 2023-04-29 NOTE — Op Note (Signed)
[facility] Cardiopulmonary Patient Name: Tanner [name] Pocedure Date: 04/29/2023 MRN: [id number] Attending MD: Tomma Lightning MD, MD, [id number] Date of Birth: 1946-04-12 CSN: Finalized Age: 77 Admit Type: Inpatient Gender: Male Procedure:             Bronchoscopy Indications:           Bilateral bacterial pneumonia Providers:             Citlaly Camplin A. Wynona Neat MD, MD, Vicki Mallet, RN, Priscella Mann, Technician Referring MD:           Medicines:             General Anesthesia, See the Anesthesia note for                         documentation of the administered medications Complications:         No immediate complications Estimated Blood Loss:  Estimated blood loss: none. Procedure:             Pre-Anesthesia Assessment:                        - The heart rate, respiratory rate, oxygen                         saturations, blood pressure, adequacy of pulmonary                         ventilation, and response to care were monitored                         throughout the procedure.                        - The anesthesia plan was to use general anesthesia.                        After obtaining informed consent, the bronchoscope was                         passed under direct vision. Throughout the procedure,                         the patient's blood pressure, pulse, and oxygen                         saturations were monitored continuously. the [id number]                         ([id number]) Olympus bronchoscope was introduced through                         the mouth, via the endotracheal tube (the patient was                         intubated for the procedure) and advanced to the                         tracheobronchial tree  of both lungs. The procedure was                         accomplished without difficulty. The patient tolerated                         the procedure well. The patient tolerated the                         procedure  well. Scope In: Scope Out: Findings:      Right Lung Abnormalities: The bronchoscope was advanced until wedged at       the desired location for bronchoalveolar lavage. BAL was performed in       the RLL lateral basal segment (B9) of the lung and sent for cell count       and differential, routine cytology and bacterial, AFB and fungal       analysis. 140 mL of fluid were instilled. 60 mL were returned. The       return was mucoid. There were no mucoid plugs in the return fluid.       Multiple specimens were obtained and pooled into one specimen, which was       sent for analysis.      Bilateral Lung Abnormalities: Copious exudate was found in the trachea. Impression:            - Bilateral bacterial pneumonia                        - Bronchoalveolar lavage was performed.                        - Exudate was found in the trachea. Moderate Sedation:      An independent trained observer was present and continuously monitored       the patient. Recommendation:        - Await test results. Procedure Code(s):     --- Professional ---                        [phone], Bronchoscopy, rigid or flexible, including                         fluoroscopic guidance, when performed; with bronchial                         alveolar lavage Diagnosis Code(s):     --- Professional ---                        J15.9, Unspecified bacterial pneumonia                        R09.89, Other specified symptoms and signs involving                         the circulatory and respiratory systems CPT copyright 2022 [employer]. All rights reserved. The codes documented in this report are preliminary and upon coder review may  be revised to meet current compliance requirements. Virl Diamond, MD Tomma Lightning MD, MD 04/29/2023 8:08:50 AM This report has been signed electronically. Number of Addenda: 0

## 2023-04-29 NOTE — Nursing Note (Signed)
Behavioral Health Integration--Brief Note  [facility] Enrollment Date Patient was enrolled in [facility] on 04/14/2023  9:51 AM.     PCP Rhoderick Moody, DO   Collaborating Psychiatrist: Doristine Bosworth, MD  Behavioral Health Case Manager: Tasia Catchings, MSW        MSW made contact with patient via telephone. He was in the middle of a project and agreed to a call next week for follow up.   Total [facility] time spent this encounter is 1 minutes.    Tasia Catchings, MSW  04/29/2023, 13:08  Behavioral Health Case Manager

## 2023-04-30 ENCOUNTER — Inpatient Hospital Stay (HOSPITAL_COMMUNITY): Payer: No Typology Code available for payment source

## 2023-04-30 DIAGNOSIS — E1143 Type 2 diabetes mellitus with diabetic autonomic (poly)neuropathy: Secondary | ICD-10-CM

## 2023-04-30 LAB — CBC WITH DIFFERENTIAL/PLATELET
Abs Immature Granulocytes: 0.12 10*3/uL — ABNORMAL HIGH (ref 0.00–0.07)
Basophils Absolute: 0 10*3/uL (ref 0.0–0.1)
Basophils Relative: 0 %
Eosinophils Absolute: 0 10*3/uL (ref 0.0–0.5)
Eosinophils Relative: 0 %
HCT: 28.2 % — ABNORMAL LOW (ref 39.0–52.0)
Hemoglobin: 8.1 g/dL — ABNORMAL LOW (ref 13.0–17.0)
Immature Granulocytes: 1 %
Lymphocytes Relative: 8 %
Lymphs Abs: 0.7 10*3/uL (ref 0.7–4.0)
MCH: 23.6 pg — ABNORMAL LOW (ref 26.0–34.0)
MCHC: 28.7 g/dL — ABNORMAL LOW (ref 30.0–36.0)
MCV: 82.2 fL (ref 80.0–100.0)
Monocytes Absolute: 0.6 10*3/uL (ref 0.1–1.0)
Monocytes Relative: 6 %
Neutro Abs: 7.9 10*3/uL — ABNORMAL HIGH (ref 1.7–7.7)
Neutrophils Relative %: 85 %
Platelets: 202 10*3/uL (ref 150–400)
RBC: 3.43 MIL/uL — ABNORMAL LOW (ref 4.22–5.81)
RDW: 19.1 % — ABNORMAL HIGH (ref 11.5–15.5)
WBC: 9.4 10*3/uL (ref 4.0–10.5)
nRBC: 0 % (ref 0.0–0.2)

## 2023-04-30 LAB — STREP PNEUMONIAE URINARY ANTIGEN: Strep Pneumo Urinary Antigen: NEGATIVE

## 2023-04-30 LAB — BASIC METABOLIC PANEL
Anion gap: 9 (ref 5–15)
BUN: 29 mg/dL — ABNORMAL HIGH (ref 8–23)
CO2: 26 mmol/L (ref 22–32)
Calcium: 8.7 mg/dL — ABNORMAL LOW (ref 8.9–10.3)
Chloride: 101 mmol/L (ref 98–111)
Creatinine, Ser: 2.03 mg/dL — ABNORMAL HIGH (ref 0.61–1.24)
GFR, Estimated: 33 mL/min — ABNORMAL LOW (ref >60)
Glucose, Bld: 138 mg/dL — ABNORMAL HIGH (ref 70–99)
Potassium: 4 mmol/L (ref 3.5–5.1)
Sodium: 136 mmol/L (ref 135–145)

## 2023-04-30 LAB — CYTOLOGY - NON PAP

## 2023-04-30 MED ORDER — LINEZOLID 600 MG PO TABS
600.0000 mg | ORAL_TABLET | Freq: Two times a day (BID) | ORAL | Status: DC
  Administered 2023-04-30 – 2023-05-05 (×10): 600 mg via ORAL
  Filled 2023-04-30 (×10): qty 1

## 2023-04-30 MED ORDER — GABAPENTIN 300 MG PO CAPS
300.0000 mg | ORAL_CAPSULE | Freq: Three times a day (TID) | ORAL | Status: DC
  Administered 2023-04-30 – 2023-05-05 (×14): 300 mg via ORAL
  Filled 2023-04-30 (×15): qty 1

## 2023-04-30 NOTE — Progress Notes (Signed)
[facility]                                                                               Tanner [name], is a 77 y.o. male, DOB - Jan 04, 1946, UEA:[id number] Admit date - 04/24/2023    Outpatient Primary MD for the patient is [facility]  LOS - 6  days    Brief summary   77 y.o. male with medical history significant for chronic systolic heart failure, paroxysmal atrial fibrillation, history of PE on Eliquis, type 2 diabetes, C. difficile, CKD 3B, morbid obesity, recent bacteremia who presents with altered mental status and hypoxia. Patient was recently hospitalized from 10/22 to 10/30 for prosthetic right knee infection with MRSA/Enterococcus faecalis bacteremia s/p irrigation/debridement with poly exchange on 10/23.  He was followed by infectious disease and orthopedics.  He is to remain on IV daptomycin till 05/19/23 and then Doxy 100 mg twice daily indefinitely for suppressive therapy. He was found to be febrile with a temp of 101, hypoxia with SpO2 87% on 4 L.  Chest x-ray shows persistent bilateral lower lobe airspace opacities.  Findings could represent infection versus inflammation versus malignancy or atelectasis.  Patient is admitted for further evaluation.   Assessment & Plan    Assessment and Plan: * Acute hypoxic respiratory failure (HCC)/ Multifocal pneumonia.  Initially started the patient on IV vancomycin and cefepime.  ID consulted not on board PCCM consulted for evaluation of eosinophilic pneumonia from daptomycin and he underwent bronchoscopy today, and BAL sent for analysis for eosinophilic pneumonia vs daptomycin induced lung injury.  Patient remains afebrile and WBC count wnl ID recommends to continue with zyvox EOT till 12/.4/24 followed by oral suppression .  Complete 7 days of cefepime pending bronchoscopy findings.  Blood cultures have been negative so far SLP evaluation ordered to evaluate for aspiration.  Transitioned to regular diet  with nectar thick liquids.   Sepsis (HCC) - Presented with fever, tachycardia and leukocytosis with finding of multifocal pneumonia on CT chest -Continue with antibiotics as per infectious disease  History of MRSA infection - Patient was recently hospitalized from 10/22 to 10/30 for prosthetic right knee infection with MRSA/Enterococcus faecalis bacteremia s/p irrigation/debridement with poly exchange on 10/23.  He was followed by infectious disease and orthopedics.  Patient was supposed to be on IV daptomycin till 12/4 and then Doxy 100 mg twice daily indefinitely for suppressive therapy. Transitioned to zyvox 600mg  BID.  -ID consulted, PICC line changed.  Essential hypertension Blood pressure parameters are optimal  History of pulmonary embolus (PE) -continue Eliquis  Lung nodule -Right lung nodules measuring up to 6 mm. Non-contrast chest CT at 3-6 months is recommended. If the nodules are stable at time of repeat CT, then future CT at 18-24 months  Acute metabolic encephalopathy -secondary to sepsis and acute hypoxic respiratory failure secondary to multifocal pneumonia  -Patient appears to be back to baseline he is alert and oriented.  And answering all questions appropriately. - pt reports feeling weak.  PT/OT on board.  Chronic systolic CHF (congestive heart failure) (HCC) -b/l LE edema on exam -continue Torsemide   DM2 (diabetes mellitus, type 2) (HCC) -controlled. A1C of 5.3  last month  CBG (last 3)  Recent Labs    04/29/23 0819  GLUCAP 89      Morbid obesity (HCC) BMI of 42  Anemia of chronic disease.  Hemoglobin stable around 8.   Hypokalemia Replaced and repeat level within normal limits   Acute on Stage IIIb CKD Creatinine slightly elevated than baseline .  Creatinine 2 this morning, baseline appears to be around 1.7 to 1.8.     Paroxysmal atrial fibrillation Patient is on Eliquis anticoagulation Continue with Coreg 6.25 mg twice daily  RN  Pressure Injury Documentation: Pressure Injury 03/18/23 Buttocks Left;Right Stage 2 -  Partial thickness loss of dermis presenting as a shallow open injury with a red, pink wound bed without slough. (Active)  03/18/23 0629  Location: Buttocks  Location Orientation: Left;Right  Staging: Stage 2 -  Partial thickness loss of dermis presenting as a shallow open injury with a red, pink wound bed without slough.  Wound Description (Comments):   Present on Admission: Yes  Dressing Type Foam - Lift dressing to assess site every shift 04/27/23 2013      Estimated body mass index is 37.99 kg/m as calculated from the following:   Height as of this encounter: 5\' 4"  (1.626 m).   Weight as of this encounter: 100.4 kg.  Code Status: full code.  DVT Prophylaxis:   apixaban (ELIQUIS) tablet 5 mg   Level of Care: Level of care: Telemetry Medical Family Communication: none at bedside. Discussed with family over the phone and updated the results.   Disposition Plan:     Remains inpatient appropriate:  awaiting BAL results.   Procedures:  bRONCHOSCOPY Consultants:   PCCM ED  Antimicrobials:   Anti-infectives (From admission, onward)    Start     Dose/Rate Route Frequency Ordered Stop   04/30/23 2000  linezolid (ZYVOX) tablet 600 mg        600 mg Oral Every 12 hours 04/30/23 0900 05/20/23 0959   04/27/23 2000  vancomycin (VANCOCIN) IVPB 1000 mg/200 mL premix  Status:  Discontinued        1,000 mg 200 mL/hr over 60 Minutes Intravenous Every 24 hours 04/27/23 0821 04/27/23 1225   04/27/23 2000  vancomycin (VANCOREADY) IVPB 750 mg/150 mL  Status:  Discontinued        750 mg 150 mL/hr over 60 Minutes Intravenous Every 24 hours 04/27/23 1225 04/30/23 0900   04/25/23 2000  vancomycin (VANCOREADY) IVPB 750 mg/150 mL  Status:  Discontinued        750 mg 150 mL/hr over 60 Minutes Intravenous Every 24 hours 04/25/23 0033 04/27/23 0821   04/25/23 0800  ceFEPIme (MAXIPIME) 2 g in sodium chloride 0.9 %  100 mL IVPB        2 g 200 mL/hr over 30 Minutes Intravenous Every 12 hours 04/25/23 0033 05/01/23 1959   04/24/23 2000  metroNIDAZOLE (FLAGYL) IVPB 500 mg        500 mg 100 mL/hr over 60 Minutes Intravenous  Once 04/24/23 1950 04/24/23 2314   04/24/23 2000  ceFEPIme (MAXIPIME) 2 g in sodium chloride 0.9 % 100 mL IVPB        2 g 200 mL/hr over 30 Minutes Intravenous  Once 04/24/23 1956 04/24/23 2158   04/24/23 2000  vancomycin (VANCOREADY) IVPB 2000 mg/400 mL        2,000 mg 200 mL/hr over 120 Minutes Intravenous  Once 04/24/23 1956 04/25/23 0043        Medications  Scheduled  Meds:  apixaban  5 mg Oral BID   buPROPion  150 mg Oral BID   carvedilol  6.25 mg Oral BID WC   Chlorhexidine Gluconate Cloth  6 each Topical Daily   finasteride  5 mg Oral QHS   gabapentin  300 mg Oral TID   linezolid  600 mg Oral Q12H   loratadine  10 mg Oral Daily   mupirocin ointment  1 Application Nasal BID   nortriptyline  20 mg Oral QHS   pantoprazole  40 mg Oral Q1200   polyethylene glycol  17 g Oral Daily   senna  2 tablet Oral QHS   sodium chloride flush  10-40 mL Intracatheter Q12H   torsemide  20 mg Oral Daily   Continuous Infusions:  ceFEPime (MAXIPIME) IV 2 g (04/30/23 0807)   PRN Meds:.diphenhydrAMINE, sodium chloride flush    Subjective:   Tanner [name] was seen and examined today.  No chest pain. Remains on 2lit of Cherry Oxygen.   Objective:   Vitals:   04/29/23 1618 04/29/23 1900 04/30/23 0453 04/30/23 0739  BP: (!) 124/57 122/84 138/62 (!) 126/59  Pulse: 70 74 71 68  Resp: 18 18 18 18   Temp: (!) 97.5 F (36.4 C) 97.7 F (36.5 C) (!) 97.4 F (36.3 C) 98 F (36.7 C)  TempSrc: Oral Oral Oral   SpO2: 98%  (!) 84% 97%  Weight:      Height:        Intake/Output Summary (Last 24 hours) at 04/30/2023 1550 Last data filed at 04/30/2023 0757 Gross per 24 hour  Intake 980 ml  Output 400 ml  Net 580 ml   Filed Weights   04/24/23 2122 04/25/23 0210  Weight: 111 kg 100.4  kg     Exam General exam: Appears calm and comfortable  Respiratory system: diminished at bases, on 2lit of Roe oxygen.  Cardiovascular system: S1 & S2 heard, RRR.  Gastrointestinal system: Abdomen is nondistended, soft and nontender.  Central nervous system: Alert and oriented. No focal neurological deficits. Extremities: Symmetric 5 x 5 power. Skin: No rashes, lesions or ulcers Psychiatry:  Mood & affect appropriate.      Data Reviewed:  I have personally reviewed following labs and imaging studies   CBC Lab Results  Component Value Date   WBC 9.4 04/30/2023   RBC 3.43 (L) 04/30/2023   HGB 8.1 (L) 04/30/2023   HCT 28.2 (L) 04/30/2023   MCV 82.2 04/30/2023   MCH 23.6 (L) 04/30/2023   PLT 202 04/30/2023   MCHC 28.7 (L) 04/30/2023   RDW 19.1 (H) 04/30/2023   LYMPHSABS 0.7 04/30/2023   MONOABS 0.6 04/30/2023   EOSABS 0.0 04/30/2023   BASOSABS 0.0 04/30/2023     Last metabolic panel Lab Results  Component Value Date   NA 136 04/30/2023   K 4.0 04/30/2023   CL 101 04/30/2023   CO2 26 04/30/2023   BUN 29 (H) 04/30/2023   CREATININE 2.03 (H) 04/30/2023   GLUCOSE 138 (H) 04/30/2023   GFRNONAA 33 (L) 04/30/2023   GFRAA 55 (L) 01/23/2020   CALCIUM 8.7 (L) 04/30/2023   PHOS 3.4 04/28/2023   PROT 5.4 (L) 04/24/2023   ALBUMIN 2.4 (L) 04/24/2023   LABGLOB 1.8 01/23/2020   AGRATIO 2.3 (H) 01/23/2020   BILITOT 0.7 04/24/2023   ALKPHOS 74 04/24/2023   AST 17 04/24/2023   ALT 15 04/24/2023   ANIONGAP 9 04/30/2023    CBG (last 3)  Recent Labs    04/29/23  1610  GLUCAP 89      Coagulation Profile: Recent Labs  Lab 04/24/23 1942  INR 1.4*     Radiology Studies: DG Swallowing Func-Speech Pathology  Result Date: 04/30/2023 Table formatting from the original result was not included. Modified Barium Swallow Study Patient Details Name: SAURAV SHARER MRN: [id number] Date of Birth: 11-04-1945 Today's Date: 04/30/2023 HPI/PMH: HPI: 77 year old male with extensive  past medical history with recent admission for AKI and polymicrobial bacteremia (MRSA, e. Faecalis) from periprosthetic right knee infection, discharged on daptomycin who presented to the ED on 11/9 with altered mental status. Per EDP note, presented with two days of cough and altered mental status. Son prsented with patient and noted he had fallen three times in the three days prior to presentation. Son also noted him falling asleep during conversation, not acting himself. CT chest with multifocal GGO consistent with multifocal pneumonia. In prior admissio pt diagnosed with oral dysphagia with Bell's Palsy with high risk of aspiration post swallow due to residue. Other PMH including CHF, HTN, HLD, paroxysmal atrial fibrillation, PE/DVT on Eliquis, diabetes, c.difficile, CKD 3, obesity Clinical Impression: Clinical Impression: Pt exhibits high risk of aspiration with liquids and of particulate matter from solids. Pt has oral residue post swallow that he senses and transits, but does not often swallow. This oral residue spills and collects with moderate vallecular residue (base of tongue weakness) and pools in the pyriform sinuses. Pt has no sensation of this. He silently aspirates residue as he is moving or talking, or taking his next sips or bites. He also has aspiraiton of nectar thick liquids before the swallow, but a chin tuck improves timing and helps clear vallecualr residue. Oral residue with nectar is minimal, suspect due to a more cohesive bolus. Pt is unable to complete a second dry in swallow despite heavy verbal and visual cues. He moves his head all around and thrusts his tongue but rarely achieves a second swallow. A chin tuck with nectar thick liquids and regualr solids is recommended. Pt will benefit from training a second swallow, improving cough mechanics and learing a preventative cough given poor sensation. Risk of aspiration will persist. Factors that may increase risk of adverse event in  presence of aspiration Rubye Oaks & Clearance Coots 2021): Factors that may increase risk of adverse event in presence of aspiration Rubye Oaks & Clearance Coots 2021): Limited mobility; Frail or deconditioned; Reduced cognitive function; Inadequate oral hygiene; Weak cough; Aspiration of thick, dense, and/or acidic materials Recommendations/Plan: Swallowing Evaluation Recommendations Swallowing Evaluation Recommendations Recommendations: PO diet PO Diet Recommendation: Regular; Mildly thick liquids (Level 2, nectar thick) Liquid Administration via: Straw; Cup Medication Administration: Whole meds with puree Supervision: Patient able to self-feed Swallowing strategies  : Slow rate; Small bites/sips; Chin tuck; Follow solids with liquids; Hard cough after swallowing Postural changes: Position pt fully upright for meals Oral care recommendations: Oral care BID (2x/day) Treatment Plan Treatment Plan Treatment recommendations: Therapy as outlined in treatment plan below Follow-up recommendations: Skilled nursing-short term rehab (<3 hours/day) Functional status assessment: Patient has had a recent decline in their functional status and demonstrates the ability to make significant improvements in function in a reasonable and predictable amount of time. Treatment frequency: Min 2x/week Treatment duration: 2 weeks Interventions: Aspiration precaution training; Patient/family education; Trials of upgraded texture/liquids; Diet toleration management by SLP; Compensatory techniques Recommendations Recommendations for follow up therapy are one component of a multi-disciplinary discharge planning process, led by the attending physician.  Recommendations may be updated based on patient status,  additional functional criteria and insurance authorization. Assessment: Orofacial Exam: Orofacial Exam Oral Cavity: Oral Hygiene: Xerostomia Oral Cavity - Dentition: Dentures, top; Dentures, bottom Orofacial Anatomy: WFL Oral Motor/Sensory Function: Suspected  cranial nerve impairment CN V - Trigeminal: Right motor impairment CN VII - Facial: Right motor impairment CN IX - Glossopharyngeal, CN X - Vagus: WFL CN XII - Hypoglossal: WFL Anatomy: Anatomy: WFL Boluses Administered: Boluses Administered Boluses Administered: Thin liquids (Level 0); Mildly thick liquids (Level 2, nectar thick); Solid; Puree  Oral Impairment Domain: Oral Impairment Domain Lip Closure: No labial escape Tongue control during bolus hold: Cohesive bolus between tongue to palatal seal Bolus preparation/mastication: Slow prolonged chewing/mashing with complete recollection Bolus transport/lingual motion: Repetitive/disorganized tongue motion Oral residue: Residue collection on oral structures Location of oral residue : Floor of mouth; Tongue; Lateral sulci Initiation of pharyngeal swallow : Pyriform sinuses  Pharyngeal Impairment Domain: Pharyngeal Impairment Domain Soft palate elevation: No bolus between soft palate (SP)/pharyngeal wall (PW) Laryngeal elevation: Complete superior movement of thyroid cartilage with complete approximation of arytenoids to epiglottic petiole Anterior hyoid excursion: Complete anterior movement Epiglottic movement: Complete inversion Laryngeal vestibule closure: Complete, no air/contrast in laryngeal vestibule Pharyngeal stripping wave : Present - complete Pharyngeal contraction (A/P view only): N/A Pharyngoesophageal segment opening: Complete distension and complete duration, no obstruction of flow Tongue base retraction: Narrow column of contrast or air between tongue base and PPW Pharyngeal residue: Collection of residue within or on pharyngeal structures Location of pharyngeal residue: Valleculae  Esophageal Impairment Domain: No data recorded Pill: No data recorded Penetration/Aspiration Scale Score: Penetration/Aspiration Scale Score 1.  Material does not enter airway: Puree; Solid 4.  Material enters airway, CONTACTS cords then ejected out: Mildly thick liquids  (Level 2, nectar thick) 7.  Material enters airway, passes BELOW cords and not ejected out despite cough attempt by patient: Thin liquids (Level 0) 8.  Material enters airway, passes BELOW cords without attempt by patient to eject out (silent aspiration) : Thin liquids (Level 0) Compensatory Strategies: Compensatory Strategies Compensatory strategies: Yes Straw: Ineffective Effortful swallow: Ineffective Multiple swallows: Ineffective Chin tuck: Effective Effective Chin Tuck: Mildly thick liquid (Level 2, nectar thick) Liquid wash: Effective Effective Liquid Wash: Mildly thick liquid (Level 2, nectar thick)   General Information: No data recorded Diet Prior to this Study: Regular; Thin liquids (Level 0)   Temperature : Normal   No data recorded  No data recorded  History of Recent Intubation: No  Behavior/Cognition: Alert; Cooperative; Pleasant mood Self-Feeding Abilities: Able to self-feed Baseline vocal quality/speech: Normal Volitional Cough: Able to elicit Volitional Swallow: Unable to elicit (struggles to elicit) No data recorded Goal Planning: Prognosis for improved oropharyngeal function: Fair Barriers to Reach Goals: Cognitive deficits No data recorded No data recorded Consulted and agree with results and recommendations: Patient Pain: Pain Assessment Pain Assessment: Faces Faces Pain Scale: 2 Pain Location: R knee Pain Descriptors / Indicators: Grimacing Pain Intervention(s): Monitored during session; Repositioned End of Session: Start Time:SLP Start Time (ACUTE ONLY): 1330 Stop Time: SLP Stop Time (ACUTE ONLY): 1345 Time Calculation:SLP Time Calculation (min) (ACUTE ONLY): 15 min Charges: SLP Evaluations $ SLP Speech Visit: 1 Visit SLP Evaluations $BSS Swallow: 1 Procedure $MBS Swallow: 1 Procedure $Swallowing Treatment: 1 Procedure SLP visit diagnosis: SLP Visit Diagnosis: Dysphagia, oropharyngeal phase (R13.12) Past Medical History: Past Medical History: Diagnosis Date  Acute kidney failure (HCC)    Anxiety   Arthritis   Back pain   BPH (benign prostatic hypertrophy)   CHF (congestive heart failure) (  HCC)   Clostridium difficile infection   Clotting disorder (HCC)   Depression   Diabetes (HCC) 12/11/2016  type 2   DVT (deep venous thrombosis) (HCC)   DVT of deep femoral vein, right (HCC) 06/29/2018  Edema, lower extremity   High cholesterol   Hypertension   Joint pain   Low back pain potentially associated with spinal stenosis   Neuromuscular disorder (HCC)   Neuropathy of lower extremity   bilateral  OSA (obstructive sleep apnea)   pt denies   Osteoarthritis   PE (pulmonary embolism)   Pneumonia   hx of x 2   Ventral hernia  Past Surgical History: Past Surgical History: Procedure Laterality Date  CARDIOVERSION N/A 08/19/2022  Procedure: CARDIOVERSION;  Surgeon: Meriam Sprague, MD;  Location: [facility] ENDOSCOPY;  Service: Cardiovascular;  Laterality: N/A;  EYE SURGERY    HERNIA REPAIR  1999  I & D KNEE WITH POLY EXCHANGE Right 04/07/2023  Procedure: IRRIGATION AND DEBRIDEMENT KNEE WITH POLY EXCHANGE;  Surgeon: Durene Romans, MD;  Location: [facility] OR;  Service: Orthopedics;  Laterality: Right;  TOTAL KNEE ARTHROPLASTY Left 08/26/2020  Procedure: LEFT TOTAL KNEE ARTHROPLASTY;  Surgeon: Gean Birchwood, MD;  Location: [facility] ORS;  Service: Orthopedics;  Laterality: Left;  TOTAL KNEE ARTHROPLASTY Right 12/15/2022  Procedure: TOTAL KNEE ARTHROPLASTY;  Surgeon: Durene Romans, MD;  Location: [facility] ORS;  Service: Orthopedics;  Laterality: Right;  TRANSESOPHAGEAL ECHOCARDIOGRAM (CATH LAB) N/A 04/12/2023  Procedure: TRANSESOPHAGEAL ECHOCARDIOGRAM;  Surgeon: Chrystie Nose, MD;  Location: [facility] INVASIVE CV LAB;  Service: Cardiovascular;  Laterality: N/A; [place]Blo[place], [name] 04/30/2023, 2:31 PM       Kathlen Mody M.D. [facility] 04/30/2023, 3:50 PM  Available via Epic secure chat 7am-7pm After 7 pm, please refer to night coverage provider listed on amion.

## 2023-04-30 NOTE — Progress Notes (Signed)
Modified Barium Swallow Study  Patient Details  Name: Tanner [name] MRN: [id number] Date of Birth: 1946/04/16  Today's Date: 04/30/2023  Modified Barium Swallow completed.  Full report located under Chart Review in the Imaging Section.  History of Present Illness 77 year old male with extensive past medical history with recent admission for AKI and polymicrobial bacteremia (MRSA, e. Faecalis) from periprosthetic right knee infection, discharged on daptomycin who presented to the ED on 11/9 with altered mental status. Per EDP note, presented with two days of cough and altered mental status. Son prsented with patient and noted he had fallen three times in the three days prior to presentation. Son also noted him falling asleep during conversation, not acting himself. CT chest with multifocal GGO consistent with multifocal pneumonia. In prior admissio pt diagnosed with oral dysphagia with Bell's Palsy with high risk of aspiration post swallow due to residue. Other PMH including CHF, HTN, HLD, paroxysmal atrial fibrillation, PE/DVT on Eliquis, diabetes, c.difficile, CKD 3, obesity   Clinical Impression Pt exhibits high risk of aspiration with liquids and of particulate matter from solids. Pt has oral residue post swallow that he senses and transits, but does not often swallow. This oral residue spills and collects with moderate vallecular residue (base of tongue weakness) and pools in the pyriform sinuses. Pt has no sensation of this. He silently aspirates residue as he is moving or talking, or taking his next sips or bites. He also has aspiration of nectar thick liquids before the swallow, but a chin tuck improves timing and helps clear vallecualr residue. Oral residue with nectar is minimal, suspect due to a more cohesive bolus. Pt is unable to complete a second dry in swallow despite heavy verbal and visual cues. He moves his head all around and thrusts his tongue but rarely achieves a second swallow. A  chin tuck with nectar thick liquids and regular solids is recommended. Pt will benefit from training a second swallow, improving cough mechanics and learing a preventative cough given poor sensation. Risk of aspiration will persist.  Factors that may increase risk of adverse event in presence of aspiration Rubye Oaks & Clearance Coots 2021): Limited mobility;Frail or deconditioned;Reduced cognitive function;Inadequate oral hygiene;Weak cough;Aspiration of thick, dense, and/or acidic materials  Swallow Evaluation Recommendations Recommendations: PO diet PO Diet Recommendation: Regular;Mildly thick liquids (Level 2, nectar thick) Liquid Administration via: Straw;Cup Medication Administration: Whole meds with puree Supervision: Patient able to self-feed Swallowing strategies  : Slow rate;Small bites/sips;Chin tuck;Follow solids with liquids;Hard cough after swallowing Postural changes: Position pt fully upright for meals Oral care recommendations: Oral care BID (2x/day)      Dessiree Sze, [name] 04/30/2023,2:30 PM

## 2023-04-30 NOTE — Anesthesia Postprocedure Evaluation (Signed)
Anesthesia Post Note  Patient: Tanner [name]  Procedure(s) Performed: VIDEO BRONCHOSCOPY WITHOUT FLUORO (Bilateral) BRONCHIAL WASHINGS     Patient location during evaluation: PACU Anesthesia Type: General Level of consciousness: awake and alert Pain management: pain level controlled Vital Signs Assessment: post-procedure vital signs reviewed and stable Respiratory status: spontaneous breathing, nonlabored ventilation, respiratory function stable and patient connected to nasal cannula oxygen Cardiovascular status: blood pressure returned to baseline and stable Postop Assessment: no apparent nausea or vomiting Anesthetic complications: no   No notable events documented.  Last Vitals:  Vitals:   04/29/23 1900 04/30/23 0453  BP: 122/84 138/62  Pulse: 74 71  Resp: 18 18  Temp: 36.5 C (!) 36.3 C  SpO2:  (!) 84%    Last Pain:  Vitals:   04/30/23 0453  TempSrc: Oral  PainSc:                  Jendaya Gossett S

## 2023-04-30 NOTE — Progress Notes (Signed)
Physical Therapy Treatment Patient Details Name: Tanner [name] MRN: [id number] DOB: 1946-06-07 Today's Date: 04/30/2023   History of Present Illness The pt is a 77 yo male presenting from SNF 11[facility] with AMS and acute hypoxic resp failure due to multifocal PNA. PMH includes: CHF, afib, PE on Eliquis, DM II, C. difficile, CKD 3B, morbid obesity, L TKR 2022, R TKR 12/15/2022, and admission 04/06/23 for RLE cellulitis with I&D with poly exchange on 04/07/23.    PT Comments  Pt received in supine and agreeable to session. Pt able to sit to EOB with increased time, but no physical assist. Pt demonstrates UE and LE tremors with WB during mobility tasks causing increased instability. Pt able to step pivot to the recliner, however requires assist due to B knee instability. Deferred further ambulation for safety, but pt able to participate in static standing marches. Pt demonstrates increased knee (R>L) buckling and trunk flexion with increased fatigue requiring seated rest breaks. Pt continues to benefit from PT services to progress toward functional mobility goals.    If plan is discharge home, recommend the following: Assistance with cooking/housework;Direct supervision/assist for medications management;Direct supervision/assist for financial management;Assist for transportation;Help with stairs or ramp for entrance;A little help with walking and/or transfers;A little help with bathing/dressing/bathroom   Can travel by private vehicle     No  Equipment Recommendations  None recommended by PT    Recommendations for Other Services       Precautions / Restrictions Precautions Precautions: Fall Precaution Comments: Contact precs; watch O2, hx of buckling Restrictions Weight Bearing Restrictions: No     Mobility  Bed Mobility Overal bed mobility: Needs Assistance Bed Mobility: Supine to Sit     Supine to sit: Contact guard, HOB elevated     General bed mobility comments: increased time     Transfers Overall transfer level: Needs assistance Equipment used: Rolling walker (2 wheels) Transfers: Sit to/from Stand, Bed to chair/wheelchair/BSC Sit to Stand: Contact guard assist, Min assist   Step pivot transfers: Min assist       General transfer comment: Min A to stand from EOB progressing to CGA from recliner with improved BUE support. Min A for balance and stability during steps to the chair due to B knee instability and tremors    Ambulation/Gait             Pre-gait activities: static standing marching 2 sets x10 reps         Balance Overall balance assessment: Needs assistance Sitting-balance support: No upper extremity supported, Feet supported Sitting balance-Leahy Scale: Good Sitting balance - Comments: sitting EOB   Standing balance support: Bilateral upper extremity supported, During functional activity, Reliant on assistive device for balance Standing balance-Leahy Scale: Poor Standing balance comment: reliant on RW support                            Cognition Arousal: Alert Behavior During Therapy: WFL for tasks assessed/performed Overall Cognitive Status: No family/caregiver present to determine baseline cognitive functioning                                          Exercises General Exercises - Lower Extremity Hip Flexion/Marching: AROM, Both, Standing, 10 reps (x2)    General Comments        Pertinent Vitals/Pain Pain Assessment Pain Assessment: Faces Faces Pain  Scale: Hurts a little bit Pain Location: R knee Pain Descriptors / Indicators: Grimacing Pain Intervention(s): Monitored during session, Repositioned     PT Goals (current goals can now be found in the care plan section) Acute Rehab PT Goals Patient Stated Goal: to feel stronger so I can go home by myself (pt lives alone) PT Goal Formulation: With patient Time For Goal Achievement: 05/09/23 Progress towards PT goals: Progressing toward  goals    Frequency    Min 1X/week       AM-PAC PT "6 Clicks" Mobility   Outcome Measure  Help needed turning from your back to your side while in a flat bed without using bedrails?: A Little Help needed moving from lying on your back to sitting on the side of a flat bed without using bedrails?: A Little Help needed moving to and from a bed to a chair (including a wheelchair)?: A Little Help needed standing up from a chair using your arms (e.g., wheelchair or bedside chair)?: A Little Help needed to walk in hospital room?: Total Help needed climbing 3-5 steps with a railing? : Total 6 Click Score: 14    End of Session Equipment Utilized During Treatment: Gait belt;Oxygen Activity Tolerance: Patient tolerated treatment well;Patient limited by fatigue Patient left: with chair alarm set;in chair;with call bell/phone within reach;with nursing/sitter in room Nurse Communication: Mobility status PT Visit Diagnosis: Other abnormalities of gait and mobility (R26.89);Muscle weakness (generalized) (M62.81);Repeated falls (R29.6)     Time: 4098-1191 PT Time Calculation (min) (ACUTE ONLY): 27 min  Charges:    $Therapeutic Exercise: 8-22 mins $Therapeutic Activity: 8-22 mins PT General Charges $$ ACUTE PT VISIT: 1 Visit                     Johny Shock, PTA Acute Rehabilitation Services Secure Chat Preferred  [phone]    Johny Shock 04/30/2023, 12:31 PM

## 2023-04-30 NOTE — Progress Notes (Signed)
ID brief note  Afebrile On 2L Gantt BAL cx incubating     Latest Ref Rng & Units 04/30/2023    2:24 AM 04/28/2023    3:51 AM 04/27/2023    4:09 AM  CBC  WBC 4.0 - 10.5 K/uL 9.4  9.9  11.0   Hemoglobin 13.0 - 17.0 g/dL 8.1  8.7  8.9   Hematocrit 39.0 - 52.0 % 28.2  29.7  29.8   Platelets 150 - 400 K/uL 202  204  226       Latest Ref Rng & Units 04/30/2023    2:24 AM 04/28/2023    3:51 AM 04/27/2023    5:00 AM  CMP  Glucose 70 - 99 mg/dL 829  87  96   BUN 8 - 23 mg/dL 29  21  21    Creatinine 0.61 - 1.24 mg/dL 5.62  1.30  8.65   Sodium 135 - 145 mmol/L 136  136  139   Potassium 3.5 - 5.1 mmol/L 4.0  4.1  3.3   Chloride 98 - 111 mmol/L 101  102  100   CO2 22 - 32 mmol/L 26  27  29    Calcium 8.9 - 10.3 mg/dL 8.7  8.3  8.2    Will switch Vancomycin to linezolid to complete course of PJI EOT 12/4 to be followed by PO doxycycline for suppression Complete 7 days of cefepime for possible HAP Fu BAL cx Dr Renold Don covering this weekend and will fu cultures   Patient has a fu appt with myself on 11/27 at 4 pm   Odette Fraction, MD Infectious Disease Physician [facility] [address] [place], [place] [address] Phone: [phone]  Fax: [phone]

## 2023-04-30 NOTE — Evaluation (Signed)
Clinical/Bedside Swallow Evaluation Patient Details  Name: Tanner [name] MRN: [id number] Date of Birth: March 21, 1946  Today's Date: 04/30/2023 Time: SLP Start Time (ACUTE ONLY): 0900 SLP Stop Time (ACUTE ONLY): 0920 SLP Time Calculation (min) (ACUTE ONLY): 20 min  Past Medical History:  Past Medical History:  Diagnosis Date   Acute kidney failure (HCC)    Anxiety    Arthritis    Back pain    BPH (benign prostatic hypertrophy)    CHF (congestive heart failure) (HCC)    Clostridium difficile infection    Clotting disorder (HCC)    Depression    Diabetes (HCC) 12/11/2016   type 2    DVT (deep venous thrombosis) (HCC)    DVT of deep femoral vein, right (HCC) 06/29/2018   Edema, lower extremity    High cholesterol    Hypertension    Joint pain    Low back pain potentially associated with spinal stenosis    Neuromuscular disorder (HCC)    Neuropathy of lower extremity    bilateral   OSA (obstructive sleep apnea)    pt denies    Osteoarthritis    PE (pulmonary embolism)    Pneumonia    hx of x 2    Ventral hernia    Past Surgical History:  Past Surgical History:  Procedure Laterality Date   CARDIOVERSION N/A 08/19/2022   Procedure: CARDIOVERSION;  Surgeon: Meriam Sprague, MD;  Location: [facility] ENDOSCOPY;  Service: Cardiovascular;  Laterality: N/A;   EYE SURGERY     HERNIA REPAIR  1999   I & D KNEE WITH POLY EXCHANGE Right 04/07/2023   Procedure: IRRIGATION AND DEBRIDEMENT KNEE WITH POLY EXCHANGE;  Surgeon: Durene Romans, MD;  Location: [facility] OR;  Service: Orthopedics;  Laterality: Right;   TOTAL KNEE ARTHROPLASTY Left 08/26/2020   Procedure: LEFT TOTAL KNEE ARTHROPLASTY;  Surgeon: Gean Birchwood, MD;  Location: [facility] ORS;  Service: Orthopedics;  Laterality: Left;   TOTAL KNEE ARTHROPLASTY Right 12/15/2022   Procedure: TOTAL KNEE ARTHROPLASTY;  Surgeon: Durene Romans, MD;  Location: [facility] ORS;  Service: Orthopedics;  Laterality: Right;   TRANSESOPHAGEAL ECHOCARDIOGRAM (CATH LAB) N/A  04/12/2023   Procedure: TRANSESOPHAGEAL ECHOCARDIOGRAM;  Surgeon: Chrystie Nose, MD;  Location: [facility] INVASIVE CV LAB;  Service: Cardiovascular;  Laterality: N/A;   HPI:  77 year old male with extensive past medical history with recent admission for AKI and polymicrobial bacteremia (MRSA, e. Faecalis) from periprosthetic right knee infection, discharged on daptomycin who presented to the ED on 11/9 with altered mental status. Per EDP note, presented with two days of cough and altered mental status. Son prsented with patient and noted he had fallen three times in the three days prior to presentation. Son also noted him falling asleep during conversation, not acting himself. CT chest with multifocal GGO consistent with multifocal pneumonia. In prior admissio pt diagnosed with oral dysphagia with Bell's Palsy with high risk of aspiration post swallow due to residue. Other PMH including CHF, HTN, HLD, paroxysmal atrial fibrillation, PE/DVT on Eliquis, diabetes, c.difficile, CKD 3, obesity    Assessment / Plan / Recommendation  Clinical Impression  Visit pt at the end of breakfast. Pt consumed 100% of meal and exhibited no coughing during visit. No oral residue observed though mild right facial weakness  still present from a history of bell plasy. Pt reports he chews on the left. He mostly eats processed foods since he lives alone, does not cook. He is worried about his swallowing and says he often coughs after  eating solids. He has no memory of his prior MBS in October. Will will plan to repeat today given concern for recurrent pna and question of occult aspiration.      Aspiration Risk       Diet Recommendation Regular;Thin liquid    Liquid Administration via: Cup;Straw Medication Administration: Whole meds with liquid Supervision: Patient able to self feed    Other  Recommendations      Recommendations for follow up therapy are one component of a multi-disciplinary discharge planning process, led  by the attending physician.  Recommendations may be updated based on patient status, additional functional criteria and insurance authorization.  Follow up Recommendations        Assistance Recommended at Discharge    Functional Status Assessment    Frequency and Duration            Prognosis        Swallow Study   General HPI: 77 year old male with extensive past medical history with recent admission for AKI and polymicrobial bacteremia (MRSA, e. Faecalis) from periprosthetic right knee infection, discharged on daptomycin who presented to the ED on 11/9 with altered mental status. Per EDP note, presented with two days of cough and altered mental status. Son prsented with patient and noted he had fallen three times in the three days prior to presentation. Son also noted him falling asleep during conversation, not acting himself. CT chest with multifocal GGO consistent with multifocal pneumonia. In prior admissio pt diagnosed with oral dysphagia with Bell's Palsy with high risk of aspiration post swallow due to residue. Other PMH including CHF, HTN, HLD, paroxysmal atrial fibrillation, PE/DVT on Eliquis, diabetes, c.difficile, CKD 3, obesity Type of Study: Bedside Swallow Evaluation Diet Prior to this Study: Regular;Thin liquids (Level 0) Temperature Spikes Noted: No Respiratory Status: Room air History of Recent Intubation: No Behavior/Cognition: Alert;Cooperative;Pleasant mood Oral Cavity Assessment: Within Functional Limits Oral Care Completed by SLP: No Oral Cavity - Dentition: Dentures, top;Dentures, bottom Vision: Functional for self-feeding Self-Feeding Abilities: Able to feed self Patient Positioning: Upright in bed Baseline Vocal Quality: Normal Volitional Cough: Strong Volitional Swallow: Able to elicit    Oral/Motor/Sensory Function Overall Oral Motor/Sensory Function: Mild impairment Facial ROM: Reduced right;Suspected CN VII (facial) dysfunction Facial Symmetry:  Abnormal symmetry right;Suspected CN VII (facial) dysfunction Facial Strength: Reduced right;Suspected CN VII (facial) dysfunction Lingual ROM: Within Functional Limits Lingual Symmetry: Within Functional Limits Lingual Strength: Within Functional Limits Lingual Sensation: Within Functional Limits Velum: Within Functional Limits Mandible: Within Functional Limits   Ice Chips     Thin Liquid Thin Liquid: Within functional limits Presentation: Straw    Nectar Thick Nectar Thick Liquid: Not tested   Honey Thick Honey Thick Liquid: Not tested   Puree Puree: Not tested   Solid     Solid: Within functional limits      Roth Ress, [name] 04/30/2023,10:55 AM

## 2023-04-30 NOTE — TOC Progression Note (Addendum)
Transition of Care [facility]) - Progression Note    Patient Details  Name: Tanner [name] MRN: [id number] Date of Birth: 1946-03-22  Transition of [facility]) CM/SW Contact  Dellie Burns [place], [place] Phone Number: 04/30/2023, 1:22 PM  Clinical Narrative:   per MD, pt potentially ready for dc early next week. Confirmed with [place] at [place] pt is able to return pending auth and medical clearance. Requested CMA begin auth with [name] as this can take a few business days to complete. Will follow and assist as indicated.   Drucie Opitz REFERENCE #[id number], status pending  Dellie Burns, MSW, LCSW [phone] (coverage)      Expected Discharge Plan: Skilled Nursing Facility Barriers to Discharge: Continued Medical Work up, English as a second language teacher  Expected Discharge Plan and Services                                               Social Determinants of Health (SDOH) Interventions SDOH Screenings   Food Insecurity: No Food Insecurity (04/25/2023)  Housing: Low Risk  (04/25/2023)  Transportation Needs: No Transportation Needs (04/25/2023)  Utilities: Not At Risk (04/25/2023)  Alcohol Screen: Low Risk  (10/27/2022)  Depression (PHQ2-9): Low Risk  (01/04/2023)  Financial Resource Strain: Low Risk  (10/27/2022)  Physical Activity: Inactive (10/27/2022)  Social Connections: Moderately Integrated (10/27/2022)  Stress: No Stress Concern Present (12/31/2022)  Recent Concern: Stress - Stress Concern Present (10/30/2022)  Tobacco Use: Medium Risk (04/28/2023)    Readmission Risk Interventions    08/20/2022   11:10 AM  Readmission Risk Prevention Plan  Transportation Screening Complete  PCP or Specialist Appt within 3-5 Days Complete  HRI or Home Care Consult Complete  Palliative Care Screening Not Applicable  Medication Review (RN Care Manager) Complete

## 2023-04-30 NOTE — Progress Notes (Signed)
   NAME:  Tanner [name], MRN:  [id number], DOB:  Mar 14, 1946, LOS: 6 ADMISSION DATE:  04/24/2023, CONSULTATION DATE: 04/27/2023 REFERRING MD: Dr. Idelle Leech, CHIEF COMPLAINT: Pneumonia  History of Present Illness:  77 year old male with extensive past medical history including CHF, HTN, HLD, paroxysmal atrial fibrillation, PE/DVT on Eliquis, diabetes, c.difficile, CKD 3, obesity with recent admission for AKI and polymicrobial bacteremia (MRSA, e. Faecalis) from periprosthetic right knee infection, discharged on daptomycin who presented to the ED on 11/9 with altered mental status. Per EDP note, presented with two days of cough and altered mental status. Son prsented with patient and noted he had fallen three times in the three days prior to presentation. Son also noted him falling asleep during conversation, not acting himself. In the ED, noted to be febrile to 101, O2 sat 87% on room air. Labs notable for WBC 11.2, Cr 1.72, BNP 174.5. CXR with bilateral lower lobe opacities. CT head negative. CT chest with multifocal GGO consistent with multifocal pneumonia.   Pertinent  Medical History  CHF, HTN, HLD, paroxysmal atrial fibrillation, PE/DVT on Eliquis, diabetes, c.difficile, CKD 3, obesity    Significant Hospital Events: Including procedures, antibiotic start and stop dates in addition to other pertinent events   11/9: admitted with multifocal pneumonia  11/10: on vanc/flagyl/cefepime, ID consulted recommended con't vanc/cefepime, remove picc, ?daptomycin related pneumonia  11/11: consult pulm for ?steroids vs other for possible dapto induced eosinophilic pna  11/14: Tolerated bronchoscopy well  Interim History / Subjective:  Reports not feeling well  Objective   Blood pressure (!) 126/59, pulse 68, temperature 98 F (36.7 C), resp. rate 18, height 5\' 4"  (1.626 m), weight 100.4 kg, SpO2 97%.        Intake/Output Summary (Last 24 hours) at 04/30/2023 1137 Last data filed at 04/30/2023  0757 Gross per 24 hour  Intake 1220 ml  Output 400 ml  Net 820 ml   Filed Weights   04/24/23 2122 04/25/23 0210  Weight: 111 kg 100.4 kg    Examination: Elderly male in no acute distress.  He does appear to be choking on his food. Creased breath sounds bilaterally with coarse rhonchi Heart sounds are regular Abdomen soft nontender Extremities warm 1+ edema  Resolved Hospital Problem list     Assessment & Plan:   Acute hypoxemic respiratory failure Continue oxygen supplementation  Multilobar pneumonia Status post bronchoscopy Concern for aspiration  Looking for eosinophilia in BAL fluid Continue to monitor    Follow-up on cultures from BAL  Will benefit from evaluation for silent aspirations  History of MRSA infection, hypertension, PE/DVT, diastolic heart failure, type 2 diabetes Rest of the management per primary service  Will continue to follow  Brett Canales Taris Galindo ACNP Acute Care Nurse Practitioner [facility] Pulmonary/Critical Care Please consult Amion 04/30/2023, 11:40 AM

## 2023-05-01 LAB — CBC WITH DIFFERENTIAL/PLATELET
Abs Immature Granulocytes: 0.15 10*3/uL — ABNORMAL HIGH (ref 0.00–0.07)
Basophils Absolute: 0.1 10*3/uL (ref 0.0–0.1)
Basophils Relative: 1 %
Eosinophils Absolute: 0.7 10*3/uL — ABNORMAL HIGH (ref 0.0–0.5)
Eosinophils Relative: 7 %
HCT: 32.1 % — ABNORMAL LOW (ref 39.0–52.0)
Hemoglobin: 9.3 g/dL — ABNORMAL LOW (ref 13.0–17.0)
Immature Granulocytes: 1 %
Lymphocytes Relative: 9 %
Lymphs Abs: 1 10*3/uL (ref 0.7–4.0)
MCH: 24 pg — ABNORMAL LOW (ref 26.0–34.0)
MCHC: 29 g/dL — ABNORMAL LOW (ref 30.0–36.0)
MCV: 82.7 fL (ref 80.0–100.0)
Monocytes Absolute: 0.7 10*3/uL (ref 0.1–1.0)
Monocytes Relative: 6 %
Neutro Abs: 8.1 10*3/uL — ABNORMAL HIGH (ref 1.7–7.7)
Neutrophils Relative %: 76 %
Platelets: 259 10*3/uL (ref 150–400)
RBC: 3.88 MIL/uL — ABNORMAL LOW (ref 4.22–5.81)
RDW: 19.2 % — ABNORMAL HIGH (ref 11.5–15.5)
WBC: 10.7 10*3/uL — ABNORMAL HIGH (ref 4.0–10.5)
nRBC: 0 % (ref 0.0–0.2)

## 2023-05-01 LAB — ACID FAST SMEAR (AFB, MYCOBACTERIA): Acid Fast Smear: NEGATIVE

## 2023-05-01 LAB — BASIC METABOLIC PANEL
Anion gap: 11 (ref 5–15)
BUN: 29 mg/dL — ABNORMAL HIGH (ref 8–23)
CO2: 27 mmol/L (ref 22–32)
Calcium: 8.9 mg/dL (ref 8.9–10.3)
Chloride: 100 mmol/L (ref 98–111)
Creatinine, Ser: 1.97 mg/dL — ABNORMAL HIGH (ref 0.61–1.24)
GFR, Estimated: 34 mL/min — ABNORMAL LOW (ref >60)
Glucose, Bld: 165 mg/dL — ABNORMAL HIGH (ref 70–99)
Potassium: 4 mmol/L (ref 3.5–5.1)
Sodium: 138 mmol/L (ref 135–145)

## 2023-05-01 MED ORDER — BISACODYL 10 MG RE SUPP
10.0000 mg | Freq: Every day | RECTAL | Status: DC | PRN
  Filled 2023-05-01: qty 1

## 2023-05-01 NOTE — Progress Notes (Signed)
[facility]                                                                               Tanner [name], is a 77 y.o. male, DOB - 1945-11-03, RJJ:[id number] Admit date - 04/24/2023    Outpatient Primary [facility] for the [facility] is [facility]  LOS - 7  days    Brief summary   77 y.o. male with medical history significant for chronic systolic heart failure, paroxysmal atrial fibrillation, history of PE on Eliquis, type 2 diabetes, C. difficile, CKD 3B, morbid obesity, recent bacteremia who presents with altered mental status and hypoxia. Patient was recently hospitalized from 10/22 to 10/30 for prosthetic right knee infection with MRSA/Enterococcus faecalis bacteremia s/p irrigation/debridement with poly exchange on 10/23.  He was followed by infectious disease and orthopedics.  He is to remain on IV daptomycin till 05/19/23 and then Doxy 100 mg twice daily indefinitely for suppressive therapy. He was found to be febrile with a temp of 101, hypoxia with SpO2 87% on 4 L.  Chest x-ray shows persistent bilateral lower lobe airspace opacities.  Findings could represent infection versus inflammation versus malignancy or atelectasis.  Patient is admitted for further evaluation.   Assessment & Plan    Assessment and Plan: * Acute hypoxic respiratory failure (HCC)/ Multifocal pneumonia.  Initially started the patient on IV vancomycin and cefepime.  ID consulted not on board PCCM consulted for evaluation of eosinophilic pneumonia from daptomycin and he underwent bronchoscopy today, and BAL sent for analysis for eosinophilic pneumonia vs daptomycin induced lung injury.  Patient remains afebrile and WBC count wnl ID recommends to continue with zyvox EOT till 12/.4/24 followed by oral suppression .  Completed 7 days of cefepime.  Blood cultures have been negative so far SLP evaluation ordered to evaluate for aspiration.  Transitioned to regular diet with nectar thick liquids.    Sepsis (HCC) - Presented with fever, tachycardia and leukocytosis with finding of multifocal pneumonia on CT chest - resolved.  -Continue with antibiotics as per infectious disease  History of MRSA infection - Patient was recently hospitalized from 10/22 to 10/30 for prosthetic right knee infection with MRSA/Enterococcus faecalis bacteremia s/p irrigation/debridement with poly exchange on 10/23.  He was followed by infectious disease and orthopedics.  Patient was supposed to be on IV daptomycin till 12/4 and then Doxy 100 mg twice daily indefinitely for suppressive therapy.  ID on board and Transitioned to zyvox 600mg  BID.  Recommend discontinuing the PICC line on discharge.   Essential hypertension Blood pressure parameters are well controlled.   History of pulmonary embolus (PE) -continue Eliquis - Family is thinking about hold ing the eliquis in view of his recurrent falls.   Lung nodule -Right lung nodules measuring up to 6 mm. Non-contrast chest CT at 3-6 months is recommended. If the nodules are stable at time of repeat CT, then future CT at 18-24 months  Acute metabolic encephalopathy -secondary to sepsis and acute hypoxic respiratory failure secondary to multifocal pneumonia  -Patient appears to be back to baseline he is alert and oriented.  And answering all questions appropriately. - pt reports feeling weak.  PT/OT on board, plan  for SNF.   Chronic systolic CHF (congestive heart failure) (HCC) -b/l LE edema on exam - chronic. Torsemide held today for mild AKI.   DM2 (diabetes mellitus, type 2) (HCC) -controlled. A1C of 5.3 last month  CBG (last 3)  Recent Labs    04/29/23 0819  GLUCAP 89   Diet liberalized.    Morbid obesity (HCC) BMI of 42  Anemia of chronic disease.  Hemoglobin stable around 8.   Hypokalemia Replaced and repeat level within normal limits   Acute on Stage IIIb CKD Creatinine slightly elevated than baseline .  Unclear if its from  vancomycin. Will d/c torsemide for 1 to 2 days and recheck creatinine on Monday.     Paroxysmal atrial fibrillation Patient is on Eliquis anticoagulation Continue with Coreg 6.25 mg twice daily  RN Pressure Injury Documentation: Pressure Injury 03/18/23 Buttocks Left;Right Stage 2 -  Partial thickness loss of dermis presenting as a shallow open injury with a red, pink wound bed without slough. (Active)  03/18/23 0629  Location: Buttocks  Location Orientation: Left;Right  Staging: Stage 2 -  Partial thickness loss of dermis presenting as a shallow open injury with a red, pink wound bed without slough.  Wound Description (Comments):   Present on Admission: Yes  Dressing Type Gauze (Comment) 04/30/23 2300      Estimated body mass index is 37.99 kg/m as calculated from the following:   Height as of this encounter: 5\' 4"  (1.626 m).   Weight as of this encounter: 100.4 kg.  Code Status: full code.  DVT Prophylaxis:   apixaban (ELIQUIS) tablet 5 mg   Level of Care: Level of care: Telemetry Medical Family Communication: none at bedside. Discussed with family over the phone and updated the results.   Disposition Plan:     Remains inpatient appropriate:  awaiting BAL results.  Plan for discharge when [name] is obtained.  Procedures:  bRONCHOSCOPY Consultants:   PCCM ID  Antimicrobials:   Anti-infectives (From admission, onward)    Start     Dose/Rate Route Frequency Ordered Stop   04/30/23 2000  linezolid (ZYVOX) tablet 600 mg        600 mg Oral Every 12 hours 04/30/23 0900 05/20/23 0959   04/27/23 2000  vancomycin (VANCOCIN) IVPB 1000 mg/200 mL premix  Status:  Discontinued        1,000 mg 200 mL/hr over 60 Minutes Intravenous Every 24 hours 04/27/23 0821 04/27/23 1225   04/27/23 2000  vancomycin (VANCOREADY) IVPB 750 mg/150 mL  Status:  Discontinued        750 mg 150 mL/hr over 60 Minutes Intravenous Every 24 hours 04/27/23 1225 04/30/23 0900   04/25/23 2000  vancomycin  (VANCOREADY) IVPB 750 mg/150 mL  Status:  Discontinued        750 mg 150 mL/hr over 60 Minutes Intravenous Every 24 hours 04/25/23 0033 04/27/23 0821   04/25/23 0800  ceFEPIme (MAXIPIME) 2 g in sodium chloride 0.9 % 100 mL IVPB        2 g 200 mL/hr over 30 Minutes Intravenous Every 12 hours 04/25/23 0033 05/01/23 1056   04/24/23 2000  metroNIDAZOLE (FLAGYL) IVPB 500 mg        500 mg 100 mL/hr over 60 Minutes Intravenous  Once 04/24/23 1950 04/24/23 2314   04/24/23 2000  ceFEPIme (MAXIPIME) 2 g in sodium chloride 0.9 % 100 mL IVPB        2 g 200 mL/hr over 30 Minutes Intravenous  Once 04/24/23 1956  04/24/23 2158   04/24/23 2000  vancomycin (VANCOREADY) IVPB 2000 mg/400 mL        2,000 mg 200 mL/hr over 120 Minutes Intravenous  Once 04/24/23 1956 04/25/23 0043        Medications  Scheduled Meds:  apixaban  5 mg Oral BID   buPROPion  150 mg Oral BID   carvedilol  6.25 mg Oral BID WC   Chlorhexidine Gluconate Cloth  6 each Topical Daily   finasteride  5 mg Oral QHS   gabapentin  300 mg Oral TID   linezolid  600 mg Oral Q12H   loratadine  10 mg Oral Daily   mupirocin ointment  1 Application Nasal BID   nortriptyline  20 mg Oral QHS   pantoprazole  40 mg Oral Q1200   polyethylene glycol  17 g Oral Daily   senna  2 tablet Oral QHS   sodium chloride flush  10-40 mL Intracatheter Q12H   torsemide  20 mg Oral Daily   Continuous Infusions:   PRN Meds:.bisacodyl, diphenhydrAMINE, sodium chloride flush    Subjective:   Tanner [name] was seen and examined today.  Requesting something for constipation.   Objective:   Vitals:   04/30/23 2012 05/01/23 0439 05/01/23 0821 05/01/23 1601  BP: (!) 124/53 129/63 132/62 (!) 120/52  Pulse: 75 71 73 68  Resp: 18 18 16 16   Temp: 98.7 F (37.1 C) 97.6 F (36.4 C) 97.8 F (36.6 C) 97.8 F (36.6 C)  TempSrc: Oral Oral    SpO2: 97% 95% (!) 87% 95%  Weight:      Height:        Intake/Output Summary (Last 24 hours) at 05/01/2023  1654 Last data filed at 05/01/2023 0950 Gross per 24 hour  Intake 291.11 ml  Output 400 ml  Net -108.89 ml   Filed Weights   04/24/23 2122 04/25/23 0210  Weight: 111 kg 100.4 kg     Exam General exam: Appears calm and comfortable  Respiratory system: Clear to auscultation. Respiratory effort normal. Cardiovascular system: S1 & S2 heard, RRR. No JVD, Gastrointestinal system: Abdomen is nondistended, soft and nontender.  Central nervous system: Alert and oriented. Extremities:pedal edema present.  Skin: No rashes,  Psychiatry: MOOD IS APPROPRIATE.       Data Reviewed:  I have personally reviewed following labs and imaging studies   CBC Lab Results  Component Value Date   WBC 10.7 (H) 05/01/2023   RBC 3.88 (L) 05/01/2023   HGB 9.3 (L) 05/01/2023   HCT 32.1 (L) 05/01/2023   MCV 82.7 05/01/2023   MCH 24.0 (L) 05/01/2023   PLT 259 05/01/2023   MCHC 29.0 (L) 05/01/2023   RDW 19.2 (H) 05/01/2023   LYMPHSABS 1.0 05/01/2023   MONOABS 0.7 05/01/2023   EOSABS 0.7 (H) 05/01/2023   BASOSABS 0.1 05/01/2023     Last metabolic panel Lab Results  Component Value Date   NA 138 05/01/2023   K 4.0 05/01/2023   CL 100 05/01/2023   CO2 27 05/01/2023   BUN 29 (H) 05/01/2023   CREATININE 1.97 (H) 05/01/2023   GLUCOSE 165 (H) 05/01/2023   GFRNONAA 34 (L) 05/01/2023   GFRAA 55 (L) 01/23/2020   CALCIUM 8.9 05/01/2023   PHOS 3.4 04/28/2023   PROT 5.4 (L) 04/24/2023   ALBUMIN 2.4 (L) 04/24/2023   LABGLOB 1.8 01/23/2020   AGRATIO 2.3 (H) 01/23/2020   BILITOT 0.7 04/24/2023   ALKPHOS 74 04/24/2023   AST 17 04/24/2023   ALT  15 04/24/2023   ANIONGAP 11 05/01/2023    CBG (last 3)  Recent Labs    04/29/23 0819  GLUCAP 89      Coagulation Profile: Recent Labs  Lab 04/24/23 1942  INR 1.4*     Radiology Studies: DG Swallowing Func-Speech Pathology  Result Date: 04/30/2023 Table formatting from the original result was not included. Modified Barium Swallow Study  Patient Details Name: Tanner [name] MRN: [id number] Date of Birth: 1945/11/19 Today's Date: 04/30/2023 HPI/PMH: HPI: 77 year old male with extensive past medical history with recent admission for AKI and polymicrobial bacteremia (MRSA, e. Faecalis) from periprosthetic right knee infection, discharged on daptomycin who presented to the ED on 11/9 with altered mental status. Per EDP note, presented with two days of cough and altered mental status. Son prsented with patient and noted he had fallen three times in the three days prior to presentation. Son also noted him falling asleep during conversation, not acting himself. CT chest with multifocal GGO consistent with multifocal pneumonia. In prior admissio pt diagnosed with oral dysphagia with Bell's Palsy with high risk of aspiration post swallow due to residue. Other PMH including CHF, HTN, HLD, paroxysmal atrial fibrillation, PE/DVT on Eliquis, diabetes, c.difficile, CKD 3, obesity Clinical Impression: Clinical Impression: Pt exhibits high risk of aspiration with liquids and of particulate matter from solids. Pt has oral residue post swallow that he senses and transits, but does not often swallow. This oral residue spills and collects with moderate vallecular residue (base of tongue weakness) and pools in the pyriform sinuses. Pt has no sensation of this. He silently aspirates residue as he is moving or talking, or taking his next sips or bites. He also has aspiraiton of nectar thick liquids before the swallow, but a chin tuck improves timing and helps clear vallecualr residue. Oral residue with nectar is minimal, suspect due to a more cohesive bolus. Pt is unable to complete a second dry in swallow despite heavy verbal and visual cues. He moves his head all around and thrusts his tongue but rarely achieves a second swallow. A chin tuck with nectar thick liquids and regualr solids is recommended. Pt will benefit from training a second swallow, improving cough  mechanics and learing a preventative cough given poor sensation. Risk of aspiration will persist. Factors that may increase risk of adverse event in presence of aspiration Rubye Oaks [employer] Clearance Coots 2021): Factors that may increase risk of adverse event in presence of aspiration Rubye Oaks [employer] Clearance Coots 2021): Limited mobility; Frail or deconditioned; Reduced cognitive function; Inadequate oral hygiene; Weak cough; Aspiration of thick, dense, and/or acidic materials Recommendations/Plan: Swallowing Evaluation Recommendations Swallowing Evaluation Recommendations Recommendations: PO diet PO Diet Recommendation: Regular; Mildly thick liquids (Level 2, nectar thick) Liquid Administration via: Straw; Cup Medication Administration: Whole meds with puree Supervision: Patient able to self-feed Swallowing strategies  : Slow rate; Small bites/sips; Chin tuck; Follow solids with liquids; Hard cough after swallowing Postural changes: Position pt fully upright for meals Oral care recommendations: Oral care BID (2x/day) Treatment Plan Treatment Plan Treatment recommendations: Therapy as outlined in treatment plan below Follow-up recommendations: Skilled nursing-short term rehab (<3 hours/day) Functional status assessment: Patient has had a recent decline in their functional status and demonstrates the ability to make significant improvements in function in a reasonable and predictable amount of time. Treatment frequency: Min 2x/week Treatment duration: 2 weeks Interventions: Aspiration precaution training; Patient/family education; Trials of upgraded texture/liquids; Diet toleration management by SLP; Compensatory techniques Recommendations Recommendations for follow up therapy are one component  of a multi-disciplinary discharge planning process, led by the attending physician.  Recommendations may be updated based on patient status, additional functional criteria and insurance authorization. Assessment: Orofacial Exam: Orofacial Exam Oral  Cavity: Oral Hygiene: Xerostomia Oral Cavity - Dentition: Dentures, top; Dentures, bottom Orofacial Anatomy: WFL Oral Motor/Sensory Function: Suspected cranial nerve impairment CN V - Trigeminal: Right motor impairment CN VII - Facial: Right motor impairment CN IX - Glossopharyngeal, CN X - Vagus: WFL CN XII - Hypoglossal: WFL Anatomy: Anatomy: WFL Boluses Administered: Boluses Administered Boluses Administered: Thin liquids (Level 0); Mildly thick liquids (Level 2, nectar thick); Solid; Puree  Oral Impairment Domain: Oral Impairment Domain Lip Closure: No labial escape Tongue control during bolus hold: Cohesive bolus between tongue to palatal seal Bolus preparation/mastication: Slow prolonged chewing/mashing with complete recollection Bolus transport/lingual motion: Repetitive/disorganized tongue motion Oral residue: Residue collection on oral structures Location of oral residue : Floor of mouth; Tongue; Lateral sulci Initiation of pharyngeal swallow : Pyriform sinuses  Pharyngeal Impairment Domain: Pharyngeal Impairment Domain Soft palate elevation: No bolus between soft palate (SP)/pharyngeal wall (PW) Laryngeal elevation: Complete superior movement of thyroid cartilage with complete approximation of arytenoids to epiglottic petiole Anterior hyoid excursion: Complete anterior movement Epiglottic movement: Complete inversion Laryngeal vestibule closure: Complete, no air/contrast in laryngeal vestibule Pharyngeal stripping wave : Present - complete Pharyngeal contraction (A/P view only): N/A Pharyngoesophageal segment opening: Complete distension and complete duration, no obstruction of flow Tongue base retraction: Narrow column of contrast or air between tongue base and PPW Pharyngeal residue: Collection of residue within or on pharyngeal structures Location of pharyngeal residue: Valleculae  Esophageal Impairment Domain: No data recorded Pill: No data recorded Penetration/Aspiration Scale Score:  Penetration/Aspiration Scale Score 1.  Material does not enter airway: Puree; Solid 4.  Material enters airway, CONTACTS cords then ejected out: Mildly thick liquids (Level 2, nectar thick) 7.  Material enters airway, passes BELOW cords and not ejected out despite cough attempt by patient: Thin liquids (Level 0) 8.  Material enters airway, passes BELOW cords without attempt by patient to eject out (silent aspiration) : Thin liquids (Level 0) Compensatory Strategies: Compensatory Strategies Compensatory strategies: Yes Straw: Ineffective Effortful swallow: Ineffective Multiple swallows: Ineffective Chin tuck: Effective Effective Chin Tuck: Mildly thick liquid (Level 2, nectar thick) Liquid wash: Effective Effective Liquid Wash: Mildly thick liquid (Level 2, nectar thick)   General Information: No data recorded Diet Prior to this Study: Regular; Thin liquids (Level 0)   Temperature : Normal   No data recorded  No data recorded  History of Recent Intubation: No  Behavior/Cognition: Alert; Cooperative; Pleasant mood Self-Feeding Abilities: Able to self-feed Baseline vocal quality/speech: Normal Volitional Cough: Able to elicit Volitional Swallow: Unable to elicit (struggles to elicit) No data recorded Goal Planning: Prognosis for improved oropharyngeal function: Fair Barriers to Reach Goals: Cognitive deficits No data recorded No data recorded Consulted and agree with results and recommendations: Patient Pain: Pain Assessment Pain Assessment: Faces Faces Pain Scale: 2 Pain Location: R knee Pain Descriptors / Indicators: Grimacing Pain Intervention(s): Monitored during session; Repositioned End of Session: Start Time:SLP Start Time (ACUTE ONLY): 1330 Stop Time: SLP Stop Time (ACUTE ONLY): 1345 Time Calculation:SLP Time Calculation (min) (ACUTE ONLY): 15 min Charges: SLP Evaluations $ SLP Speech Visit: 1 Visit SLP Evaluations $BSS Swallow: 1 Procedure $MBS Swallow: 1 Procedure $Swallowing Treatment: 1 Procedure SLP visit  diagnosis: SLP Visit Diagnosis: Dysphagia, oropharyngeal phase (R13.12) Past Medical History: Past Medical History: Diagnosis Date  Acute kidney failure (HCC)  Anxiety   Arthritis   Back pain   BPH (benign prostatic hypertrophy)   CHF (congestive heart failure) (HCC)   Clostridium difficile infection   Clotting disorder (HCC)   Depression   Diabetes (HCC) 12/11/2016  type 2   DVT (deep venous thrombosis) (HCC)   DVT of deep femoral vein, right (HCC) 06/29/2018  Edema, lower extremity   High cholesterol   Hypertension   Joint pain   Low back pain potentially associated with spinal stenosis   Neuromuscular disorder (HCC)   Neuropathy of lower extremity   bilateral  OSA (obstructive sleep apnea)   pt denies   Osteoarthritis   PE (pulmonary embolism)   Pneumonia   hx of x 2   Ventral hernia  Past Surgical History: Past Surgical History: Procedure Laterality Date  CARDIOVERSION N/A 08/19/2022  Procedure: CARDIOVERSION;  Surgeon: Meriam Sprague, MD;  Location: [facility] ENDOSCOPY;  Service: Cardiovascular;  Laterality: N/A;  EYE SURGERY    HERNIA REPAIR  1999  I & D KNEE WITH POLY EXCHANGE Right 04/07/2023  Procedure: IRRIGATION AND DEBRIDEMENT KNEE WITH POLY EXCHANGE;  Surgeon: Durene Romans, MD;  Location: [facility] OR;  Service: Orthopedics;  Laterality: Right;  TOTAL KNEE ARTHROPLASTY Left 08/26/2020  Procedure: LEFT TOTAL KNEE ARTHROPLASTY;  Surgeon: Gean Birchwood, MD;  Location: [facility] ORS;  Service: Orthopedics;  Laterality: Left;  TOTAL KNEE ARTHROPLASTY Right 12/15/2022  Procedure: TOTAL KNEE ARTHROPLASTY;  Surgeon: Durene Romans, MD;  Location: [facility] ORS;  Service: Orthopedics;  Laterality: Right;  TRANSESOPHAGEAL ECHOCARDIOGRAM (CATH LAB) N/A 04/12/2023  Procedure: TRANSESOPHAGEAL ECHOCARDIOGRAM;  Surgeon: Chrystie Nose, MD;  Location: [facility] INVASIVE CV LAB;  Service: Cardiovascular;  Laterality: N/A; DeBlois, [name] 04/30/2023, 2:31 PM       Kathlen Mody M.D. [facility] 05/01/2023, 4:54 PM  Available via  Epic secure chat 7am-7pm After 7 pm, please refer to night coverage provider listed on amion.

## 2023-05-01 NOTE — Plan of Care (Signed)

## 2023-05-01 NOTE — Progress Notes (Signed)
Received report from Gerlene Burdock, RN and assumed care of pt.  Pt assessed. Pt provided water and ice, diet soda.  Pillow placed under legs and legs elevated. No other voiced or observed needs at this time.

## 2023-05-02 ENCOUNTER — Encounter (HOSPITAL_COMMUNITY): Payer: Self-pay | Admitting: Pulmonary Disease

## 2023-05-02 LAB — LEGIONELLA PNEUMOPHILA SEROGP 1 UR AG: L. pneumophila Serogp 1 Ur Ag: NEGATIVE

## 2023-05-02 LAB — GLUCOSE, CAPILLARY: Glucose-Capillary: 125 mg/dL — ABNORMAL HIGH (ref 70–99)

## 2023-05-02 MED ORDER — ACETAMINOPHEN 325 MG PO TABS
650.0000 mg | ORAL_TABLET | ORAL | Status: DC | PRN
  Administered 2023-05-02: 650 mg via ORAL
  Filled 2023-05-02: qty 2

## 2023-05-02 MED ORDER — PANTOPRAZOLE SODIUM 40 MG PO TBEC: 40.0000 mg | DELAYED_RELEASE_TABLET | Freq: Every day | ORAL | Status: DC

## 2023-05-02 MED ORDER — PANTOPRAZOLE SODIUM 40 MG PO TBEC
40.0000 mg | DELAYED_RELEASE_TABLET | Freq: Every day | ORAL | Status: DC
  Administered 2023-05-03 – 2023-05-05 (×3): 40 mg via ORAL
  Filled 2023-05-02 (×3): qty 1

## 2023-05-02 NOTE — Progress Notes (Signed)
Mobility Specialist Progress Note:    05/02/23 0845  Mobility  Activity Transferred from bed to chair  Level of Assistance Moderate assist, patient does 50-74%  Assistive Device Front wheel walker  Distance Ambulated (ft) 2 ft  Activity Response Tolerated well  Mobility Referral Yes  $Mobility charge 1 Mobility  Mobility Specialist Start Time (ACUTE ONLY) 0845  Mobility Specialist Stop Time (ACUTE ONLY) 0857  Mobility Specialist Time Calculation (min) (ACUTE ONLY) 12 min   Pt received in bed, agreeable to mobility session. Worsening knee buckling requiring ModA to stand and pivot with RW to transfer to chair. Tolerated well, asx throughout. Left pt in chair, all needs met, call bell in reach, and chair alarm on. RN notified.   Feliciana Rossetti Mobility Specialist Please contact via Special educational needs teacher or  Rehab office at [phone]

## 2023-05-02 NOTE — Plan of Care (Signed)

## 2023-05-02 NOTE — Plan of Care (Signed)
Id brief note   Bal cx ngtd On linezolid for hx septic knee with mrsa and e faecalis bsi Concern for daptomycin related eosinophilic pna    Continue linezolid F/u final bal cx

## 2023-05-02 NOTE — Progress Notes (Signed)
[facility]                                                                               Tanner [name], is a 77 y.o. male, DOB - 12-Jun-1946, QMV:[id number] Admit date - 04/24/2023    [facility] for the [facility]  LOS - 8  days    Brief summary   77 y.o. male with medical history significant for chronic systolic heart failure, paroxysmal atrial fibrillation, history of PE on Eliquis, type 2 diabetes, C. difficile, CKD 3B, morbid obesity, recent bacteremia who presents with altered mental status and hypoxia. Patient was recently hospitalized from 10/22 to 10/30 for prosthetic right knee infection with MRSA/Enterococcus faecalis bacteremia s/p irrigation/debridement with poly exchange on 10/23.  He was followed by infectious disease and orthopedics.  He is to remain on IV daptomycin till 05/19/23 and then Doxy 100 mg twice daily indefinitely for suppressive therapy. He was found to be febrile with a temp of 101, hypoxia with SpO2 87% on 4 L.  Chest x-ray shows persistent bilateral lower lobe airspace opacities.  Findings could represent infection versus inflammation versus malignancy or atelectasis.  Patient is admitted for further evaluation.   Assessment & Plan    Assessment and Plan: * Acute hypoxic respiratory failure (HCC)/ Multifocal pneumonia.  Initially started the patient on IV vancomycin and cefepime.  ID consulted not on board PCCM consulted for evaluation of eosinophilic pneumonia from daptomycin and he underwent bronchoscopy today, and BAL sent for analysis for eosinophilic pneumonia vs daptomycin induced lung injury.  Patient remains afebrile and WBC count wnl ID recommends to continue with zyvox EOT till 12/.4/24 followed by oral suppression .  Completed 7 days of cefepime.  Blood cultures have been negative so far. BAL cultures still pending,  SLP evaluation ordered to evaluate for aspiration.  Transitioned to regular diet  with nectar thick liquids.   Sepsis (HCC) - Presented with fever, tachycardia and leukocytosis with finding of multifocal pneumonia on CT chest - resolved.  -Continue with antibiotics as per infectious disease  History of MRSA infection - Patient was recently hospitalized from 10/22 to 10/30 for prosthetic right knee infection with MRSA/Enterococcus faecalis bacteremia s/p irrigation/debridement with poly exchange on 10/23.  He was followed by infectious disease and orthopedics.  Patient was supposed to be on IV daptomycin till 12/4 and then Doxy 100 mg twice daily indefinitely for suppressive therapy.  ID on board and Transitioned to zyvox 600mg  BID.  BAL cultures are pending.  Recommend discontinuing the PICC line on discharge.   Essential hypertension Blood pressure parameters are optimal.  History of pulmonary embolus (PE) -continue Eliquis - Family is thinking about hold ing the eliquis in view of his recurrent falls.   Lung nodule -Right lung nodules measuring up to 6 mm. Non-contrast chest CT at 3-6 months is recommended. If the nodules are stable at time of repeat CT, then future CT at 18-24 months  Acute metabolic encephalopathy -secondary to sepsis and acute hypoxic respiratory failure secondary to multifocal pneumonia  -Patient appears to be back to baseline he is alert and oriented.  And answering all questions appropriately. - pt  reports feeling weak.  PT/OT on board, plan for SNF.   Chronic systolic CHF (congestive heart failure) (HCC) -b/l LE edema on exam - chronic. Torsemide held today for mild AKI.   DM2 (diabetes mellitus, type 2) (HCC) -controlled. A1C of 5.3 last month  CBG (last 3)  Recent Labs    05/02/23 1149  GLUCAP 125*   Diet liberalized.    Morbid obesity (HCC) BMI of 42  Anemia of chronic disease.  Hemoglobin stable around 8.   Hypokalemia Replaced and repeat level within normal limits   Acute on Stage IIIb CKD Creatinine slightly  elevated than baseline .  Unclear if its from vancomycin. Will d/c torsemide for 1 to 2 days and recheck creatinine on Monday. Creatinine improved to 1.9.     Paroxysmal atrial fibrillation Patient is on Eliquis anticoagulation Continue with Coreg 6.25 mg twice daily   Constipation:  Resolved with dulcolax suppository.   RN Pressure Injury Documentation: Pressure Injury 03/18/23 Buttocks Left;Right Stage 2 -  Partial thickness loss of dermis presenting as a shallow open injury with a red, pink wound bed without slough. (Active)  03/18/23 0629  Location: Buttocks  Location Orientation: Left;Right  Staging: Stage 2 -  Partial thickness loss of dermis presenting as a shallow open injury with a red, pink wound bed without slough.  Wound Description (Comments):   Present on Admission: Yes  Dressing Type Foam - Lift dressing to assess site every shift 05/02/23 0900      Estimated body mass index is 37.99 kg/m as calculated from the following:   Height as of this encounter: 5\' 4"  (1.626 m).   Weight as of this encounter: 100.4 kg.  Code Status: full code.  DVT Prophylaxis:   apixaban (ELIQUIS) tablet 5 mg   Level of Care: Level of care: Telemetry Medical Family Communication: none at bedside. Discussed with family over the phone and updated the results.   Disposition Plan:     Remains inpatient appropriate:  awaiting BAL results.  Plan for discharge when [name] is obtained.  Procedures:  bRONCHOSCOPY Consultants:   PCCM ID  Antimicrobials:   Anti-infectives (From admission, onward)    Start     Dose/Rate Route Frequency Ordered Stop   04/30/23 2000  linezolid (ZYVOX) tablet 600 mg        600 mg Oral Every 12 hours 04/30/23 0900 05/20/23 0959   04/27/23 2000  vancomycin (VANCOCIN) IVPB 1000 mg/200 mL premix  Status:  Discontinued        1,000 mg 200 mL/hr over 60 Minutes Intravenous Every 24 hours 04/27/23 0821 04/27/23 1225   04/27/23 2000  vancomycin (VANCOREADY) IVPB  750 mg/150 mL  Status:  Discontinued        750 mg 150 mL/hr over 60 Minutes Intravenous Every 24 hours 04/27/23 1225 04/30/23 0900   04/25/23 2000  vancomycin (VANCOREADY) IVPB 750 mg/150 mL  Status:  Discontinued        750 mg 150 mL/hr over 60 Minutes Intravenous Every 24 hours 04/25/23 0033 04/27/23 0821   04/25/23 0800  ceFEPIme (MAXIPIME) 2 g in sodium chloride 0.9 % 100 mL IVPB        2 g 200 mL/hr over 30 Minutes Intravenous Every 12 hours 04/25/23 0033 05/01/23 1056   04/24/23 2000  metroNIDAZOLE (FLAGYL) IVPB 500 mg        500 mg 100 mL/hr over 60 Minutes Intravenous  Once 04/24/23 1950 04/24/23 2314   04/24/23 2000  ceFEPIme (MAXIPIME) 2  g in sodium chloride 0.9 % 100 mL IVPB        2 g 200 mL/hr over 30 Minutes Intravenous  Once 04/24/23 1956 04/24/23 2158   04/24/23 2000  vancomycin (VANCOREADY) IVPB 2000 mg/400 mL        2,000 mg 200 mL/hr over 120 Minutes Intravenous  Once 04/24/23 1956 04/25/23 0043        Medications  Scheduled Meds:  apixaban  5 mg Oral BID   buPROPion  150 mg Oral BID   carvedilol  6.25 mg Oral BID WC   Chlorhexidine Gluconate Cloth  6 each Topical Daily   finasteride  5 mg Oral QHS   gabapentin  300 mg Oral TID   linezolid  600 mg Oral Q12H   loratadine  10 mg Oral Daily   nortriptyline  20 mg Oral QHS   [START ON 05/03/2023] pantoprazole  40 mg Oral Daily   polyethylene glycol  17 g Oral Daily   senna  2 tablet Oral QHS   sodium chloride flush  10-40 mL Intracatheter Q12H   Continuous Infusions:   PRN Meds:.acetaminophen, bisacodyl, diphenhydrAMINE, sodium chloride flush    Subjective:   Antavius Buracker was seen and examined today.  No new complaints today.  Objective:   Vitals:   05/01/23 2049 05/02/23 0415 05/02/23 0741 05/02/23 1555  BP: 132/62 (!) 142/64 (!) 129/53 (!) 125/57  Pulse: 72 82 77 69  Resp: 17 18 16 16   Temp: 99.1 F (37.3 C) 98.7 F (37.1 C) 97.7 F (36.5 C) 97.7 F (36.5 C)  TempSrc: Axillary Axillary  Oral   SpO2: 96% 96% 93% 97%  Weight:      Height:        Intake/Output Summary (Last 24 hours) at 05/02/2023 1927 Last data filed at 05/02/2023 0920 Gross per 24 hour  Intake 60 ml  Output 450 ml  Net -390 ml   Filed Weights   04/24/23 2122 04/25/23 0210  Weight: 111 kg 100.4 kg     Exam General exam: Appears calm and comfortable  Respiratory system: Clear to auscultation. Respiratory effort normal. Cardiovascular system: S1 & S2 heard, RRR.  Gastrointestinal system: Abdomen is nondistended, soft and nontender.  Central nervous system: Alert and oriented. Extremities: Symmetric 5 x 5 power. Skin: No rashes, lesions or ulcers Psychiatry: Mood & affect appropriate.        Data Reviewed:  I have personally reviewed following labs and imaging studies   CBC Lab Results  Component Value Date   WBC 10.7 (H) 05/01/2023   RBC 3.88 (L) 05/01/2023   HGB 9.3 (L) 05/01/2023   HCT 32.1 (L) 05/01/2023   MCV 82.7 05/01/2023   MCH 24.0 (L) 05/01/2023   PLT 259 05/01/2023   MCHC 29.0 (L) 05/01/2023   RDW 19.2 (H) 05/01/2023   LYMPHSABS 1.0 05/01/2023   MONOABS 0.7 05/01/2023   EOSABS 0.7 (H) 05/01/2023   BASOSABS 0.1 05/01/2023     Last metabolic panel Lab Results  Component Value Date   NA 138 05/01/2023   K 4.0 05/01/2023   CL 100 05/01/2023   CO2 27 05/01/2023   BUN 29 (H) 05/01/2023   CREATININE 1.97 (H) 05/01/2023   GLUCOSE 165 (H) 05/01/2023   GFRNONAA 34 (L) 05/01/2023   GFRAA 55 (L) 01/23/2020   CALCIUM 8.9 05/01/2023   PHOS 3.4 04/28/2023   PROT 5.4 (L) 04/24/2023   ALBUMIN 2.4 (L) 04/24/2023   LABGLOB 1.8 01/23/2020   AGRATIO 2.3 (H)  01/23/2020   BILITOT 0.7 04/24/2023   ALKPHOS 74 04/24/2023   AST 17 04/24/2023   ALT 15 04/24/2023   ANIONGAP 11 05/01/2023    CBG (last 3)  Recent Labs    05/02/23 1149  GLUCAP 125*      Coagulation Profile: No results for input(s): "INR", "PROTIME" in the last 168 hours.    Radiology Studies: No  results found.     Kathlen Mody M.D. [facility] 05/02/2023, 7:27 PM  Available via Epic secure chat 7am-7pm After 7 pm, please refer to night coverage provider listed on amion.

## 2023-05-03 ENCOUNTER — Inpatient Hospital Stay (HOSPITAL_COMMUNITY): Payer: No Typology Code available for payment source

## 2023-05-03 LAB — BASIC METABOLIC PANEL
Anion gap: 9 (ref 5–15)
BUN: 28 mg/dL — ABNORMAL HIGH (ref 8–23)
CO2: 28 mmol/L (ref 22–32)
Calcium: 8.6 mg/dL — ABNORMAL LOW (ref 8.9–10.3)
Chloride: 103 mmol/L (ref 98–111)
Creatinine, Ser: 2.39 mg/dL — ABNORMAL HIGH (ref 0.61–1.24)
GFR, Estimated: 27 mL/min — ABNORMAL LOW (ref >60)
Glucose, Bld: 88 mg/dL (ref 70–99)
Potassium: 3.4 mmol/L — ABNORMAL LOW (ref 3.5–5.1)
Sodium: 140 mmol/L (ref 135–145)

## 2023-05-03 LAB — CULTURE, RESPIRATORY W GRAM STAIN

## 2023-05-03 MED ORDER — POTASSIUM CHLORIDE CRYS ER 20 MEQ PO TBCR
40.0000 meq | EXTENDED_RELEASE_TABLET | Freq: Once | ORAL | Status: AC
  Administered 2023-05-03: 40 meq via ORAL
  Filled 2023-05-03: qty 2

## 2023-05-03 MED ORDER — SODIUM CHLORIDE 0.9 % IV SOLN: INTRAVENOUS | Status: DC

## 2023-05-03 NOTE — Progress Notes (Signed)
Physical Therapy Treatment Patient Details Name: Tanner [name] MRN: [id number] DOB: Dec 04, 1945 Today's Date: 05/03/2023   History of Present Illness The pt is a 77 yo male presenting from SNF 11/9 with AMS and acute hypoxic resp failure due to multifocal PNA. PMH includes: CHF, afib, PE on Eliquis, DM II, C. difficile, CKD 3B, morbid obesity, L TKR 2022, R TKR 12/15/2022, and admission 04/06/23 for RLE cellulitis with I&D with poly exchange on 04/07/23.    PT Comments  Pt tolerates treatment well, demonstrating improved stability this session. PT notes increased knee flexion throughout the gait cycle, however no LE tremors or buckling observed. Pt will benefit from continued frequent mobilization in an effort to improve activity tolerance and reduce falls risk. Exercise focused on knee extension will be beneficial to aide in reducing falls risk. PT continues to recommend short term inpatient PT services.    If plan is discharge home, recommend the following: Assistance with cooking/housework;Direct supervision/assist for medications management;Direct supervision/assist for financial management;Assist for transportation;Help with stairs or ramp for entrance;A little help with walking and/or transfers;A little help with bathing/dressing/bathroom   Can travel by private vehicle     No  Equipment Recommendations  None recommended by PT    Recommendations for Other Services       Precautions / Restrictions Precautions Precautions: Fall Precaution Comments: Contact precs; watch O2, hx of buckling Restrictions Weight Bearing Restrictions: No     Mobility  Bed Mobility Overal bed mobility: Needs Assistance Bed Mobility: Supine to Sit     Supine to sit: Supervision, HOB elevated          Transfers Overall transfer level: Needs assistance Equipment used: Rolling walker (2 wheels) Transfers: Sit to/from Stand, Bed to chair/wheelchair/BSC Sit to Stand: Contact guard assist   Step  pivot transfers: Contact guard assist            Ambulation/Gait Ambulation/Gait assistance: Contact guard assist Gait Distance (Feet): 20 Feet (20' x 2 trials) Assistive device: Rolling walker (2 wheels) Gait Pattern/deviations: Step-to pattern Gait velocity: reduced Gait velocity interpretation: <1.31 ft/sec, indicative of household ambulator   General Gait Details: slowed step-to gait, increased knee flexion during stance phase but no bucking or tremors noted in LE this session   Stairs             Wheelchair Mobility     Tilt Bed    Modified Rankin (Stroke Patients Only)       Balance Overall balance assessment: Needs assistance Sitting-balance support: No upper extremity supported, Feet supported Sitting balance-Leahy Scale: Good     Standing balance support: Bilateral upper extremity supported, Reliant on assistive device for balance Standing balance-Leahy Scale: Poor                              Cognition Arousal: Alert Behavior During Therapy: WFL for tasks assessed/performed Overall Cognitive Status: No family/caregiver present to determine baseline cognitive functioning Area of Impairment: Problem solving                             Problem Solving: Slow processing          Exercises      General Comments General comments (skin integrity, edema, etc.): VSS on RA      Pertinent Vitals/Pain Pain Assessment Pain Assessment: No/denies pain    Home Living  Prior Function            PT Goals (current goals can now be found in the care plan section) Acute Rehab PT Goals Patient Stated Goal: to feel stronger so I can go home by myself (pt lives alone) Progress towards PT goals: Progressing toward goals    Frequency    Min 1X/week      PT Plan      Co-evaluation              AM-PAC PT "6 Clicks" Mobility   Outcome Measure  Help needed turning from your back  to your side while in a flat bed without using bedrails?: A Little Help needed moving from lying on your back to sitting on the side of a flat bed without using bedrails?: A Little Help needed moving to and from a bed to a chair (including a wheelchair)?: A Little Help needed standing up from a chair using your arms (e.g., wheelchair or bedside chair)?: A Little Help needed to walk in hospital room?: A Little Help needed climbing 3-5 steps with a railing? : Total 6 Click Score: 16    End of Session Equipment Utilized During Treatment: Gait belt Activity Tolerance: Patient tolerated treatment well Patient left: in chair;with call bell/phone within reach;with chair alarm set Nurse Communication: Mobility status PT Visit Diagnosis: Other abnormalities of gait and mobility (R26.89);Muscle weakness (generalized) (M62.81);Repeated falls (R29.6)     Time: 1211-1228 PT Time Calculation (min) (ACUTE ONLY): 17 min  Charges:    $Gait Training: 8-22 mins PT General Charges $$ ACUTE PT VISIT: 1 Visit                     Arlyss Gandy, PT, DPT Acute Rehabilitation Office [phone]    Arlyss Gandy 05/03/2023, 12:38 PM

## 2023-05-03 NOTE — Progress Notes (Signed)
[facility]                                                                               [name], is a 77 y.o. male, DOB - 09/21/1945, QIO:[id number] Admit date - 04/24/2023    Outpatient Primary MD for the patient is [facility]  LOS - 9  days    Brief summary   77 y.o. male with medical history significant for chronic systolic heart failure, paroxysmal atrial fibrillation, history of PE on Eliquis, type 2 diabetes, C. difficile, CKD 3B, morbid obesity, recent bacteremia who presents with altered mental status and hypoxia. Patient was recently hospitalized from 10/22 to 10/30 for prosthetic right knee infection with MRSA/Enterococcus faecalis bacteremia s/p irrigation/debridement with poly exchange on 10/23.  He was followed by infectious disease and orthopedics.  He is to remain on IV daptomycin till 05/19/23 and then Doxy 100 mg twice daily indefinitely for suppressive therapy. He was found to be febrile with a temp of 101, hypoxia with SpO2 87% on 4 L.  Chest x-ray shows persistent bilateral lower lobe airspace opacities.  Findings could represent infection versus inflammation versus malignancy or atelectasis.  Patient is admitted for further evaluation.   Assessment & Plan    Assessment and Plan: * Acute hypoxic respiratory failure (HCC)/ Multifocal pneumonia.  Initially started the patient on IV vancomycin and cefepime.  ID consulted not on board PCCM consulted for evaluation of eosinophilic pneumonia from daptomycin and he underwent bronchoscopy today, and BAL sent for analysis for eosinophilic pneumonia vs daptomycin induced lung injury.  Patient remains afebrile and WBC count wnl ID recommends to continue with zyvox EOT till 12/.4/24 followed by oral suppression .  Completed 7 days of cefepime.  Blood cultures have been negative so far. BAL cultures still pending,  SLP evaluation ordered to evaluate for aspiration.  Transitioned to regular diet  with nectar thick liquids.   Sepsis (HCC) - Presented with fever, tachycardia and leukocytosis with finding of multifocal pneumonia on CT chest - resolved.  -Continue with antibiotics as per infectious disease  History of MRSA infection - Patient was recently hospitalized from 10/22 to 10/30 for prosthetic right knee infection with MRSA/Enterococcus faecalis bacteremia s/p irrigation/debridement with poly exchange on 10/23.  He was followed by infectious disease and orthopedics.  Patient was supposed to be on IV daptomycin till 12/4 and then Doxy 100 mg twice daily indefinitely for suppressive therapy.  ID on board and Transitioned to zyvox 600mg  BID.  BAL cultures are pending.  Recommend discontinuing the PICC line on discharge.   Essential hypertension Blood pressure parameters are optimal.  History of pulmonary embolus (PE) -continue Eliquis - Family is thinking about hold ing the eliquis in view of his recurrent falls.   Lung nodule -Right lung nodules measuring up to 6 mm. Non-contrast chest CT at 3-6 months is recommended. If the nodules are stable at time of repeat CT, then future CT at 18-24 months  Acute metabolic encephalopathy -secondary to sepsis and acute hypoxic respiratory failure secondary to multifocal pneumonia  -Patient appears to be back to baseline he is alert and oriented.  And answering all questions appropriately. - pt  reports feeling weak.  PT/OT on board, plan for SNF.   Chronic systolic CHF (congestive heart failure) (HCC) -b/l LE edema on exam - chronic. Torsemide held for AKI.   DM2 (diabetes mellitus, type 2) (HCC) -controlled. A1C of 5.3 last month  CBG (last 3)  Recent Labs    05/02/23 1149  GLUCAP 125*   Diet liberalized.    Morbid obesity (HCC) BMI of 42  Anemia of chronic disease.  Hemoglobin stable around 8.   Hypokalemia Replaced and repeat level within normal limits   Acute on Stage IIIb CKD Creatinine slightly elevated than  baseline .  Unclear if its from vancomycin.Torsemide discontinued. Creatinine up from 2 days ago.  He denies any urinary symptoms.  Will get US renal and UA today.     Paroxysmal atrial fibrillation Patient is on Eliquis anticoagulation Continue with Coreg 6.25 mg twice daily   Constipation:  Resolved with dulcolax suppository.   RN Pressure Injury Documentation: Pressure Injury 03/18/23 Buttocks Left;Right Stage 2 -  Partial thickness loss of dermis presenting as a shallow open injury with a red, pink wound bed without slough. (Active)  03/18/23 0629  Location: Buttocks  Location Orientation: Left;Right  Staging: Stage 2 -  Partial thickness loss of dermis presenting as a shallow open injury with a red, pink wound bed without slough.  Wound Description (Comments):   Present on Admission: Yes  Dressing Type Foam - Lift dressing to assess site every shift 05/02/23 1950      Estimated body mass index is 37.99 kg/m as calculated from the following:   Height as of this encounter: 5\' 4"  (1.626 m).   Weight as of this encounter: 100.4 kg.  Code Status: full code.  DVT Prophylaxis:   apixaban (ELIQUIS) tablet 5 mg   Level of Care: Level of care: Telemetry Medical Family Communication: none at bedside. Discussed with family over the phone and updated the results.   Disposition Plan:     Remains inpatient appropriate:  awaiting BAL results.  Plan for discharge when [name] is obtained.  Procedures:  bRONCHOSCOPY Consultants:   PCCM ID  Antimicrobials:   Anti-infectives (From admission, onward)    Start     Dose/Rate Route Frequency Ordered Stop   04/30/23 2000  linezolid (ZYVOX) tablet 600 mg        600 mg Oral Every 12 hours 04/30/23 0900 05/20/23 0959   04/27/23 2000  vancomycin (VANCOCIN) IVPB 1000 mg/200 mL premix  Status:  Discontinued        1,000 mg 200 mL/hr over 60 Minutes Intravenous Every 24 hours 04/27/23 0821 04/27/23 1225   04/27/23 2000  vancomycin  (VANCOREADY) IVPB 750 mg/150 mL  Status:  Discontinued        750 mg 150 mL/hr over 60 Minutes Intravenous Every 24 hours 04/27/23 1225 04/30/23 0900   04/25/23 2000  vancomycin (VANCOREADY) IVPB 750 mg/150 mL  Status:  Discontinued        750 mg 150 mL/hr over 60 Minutes Intravenous Every 24 hours 04/25/23 0033 04/27/23 0821   04/25/23 0800  ceFEPIme (MAXIPIME) 2 g in sodium chloride 0.9 % 100 mL IVPB        2 g 200 mL/hr over 30 Minutes Intravenous Every 12 hours 04/25/23 0033 05/01/23 1056   04/24/23 2000  metroNIDAZOLE (FLAGYL) IVPB 500 mg        500 mg 100 mL/hr over 60 Minutes Intravenous  Once 04/24/23 1950 04/24/23 2314   04/24/23 2000  ceFEPIme (  MAXIPIME) 2 g in sodium chloride 0.9 % 100 mL IVPB        2 g 200 mL/hr over 30 Minutes Intravenous  Once 04/24/23 1956 04/24/23 2158   04/24/23 2000  vancomycin (VANCOREADY) IVPB 2000 mg/400 mL        2,000 mg 200 mL/hr over 120 Minutes Intravenous  Once 04/24/23 1956 04/25/23 0043        Medications  Scheduled Meds:  apixaban  5 mg Oral BID   buPROPion  150 mg Oral BID   carvedilol  6.25 mg Oral BID WC   Chlorhexidine Gluconate Cloth  6 each Topical Daily   finasteride  5 mg Oral QHS   gabapentin  300 mg Oral TID   linezolid  600 mg Oral Q12H   loratadine  10 mg Oral Daily   nortriptyline  20 mg Oral QHS   pantoprazole  40 mg Oral Daily   polyethylene glycol  17 g Oral Daily   potassium chloride  40 mEq Oral Once   senna  2 tablet Oral QHS   sodium chloride flush  10-40 mL Intracatheter Q12H   Continuous Infusions:   PRN Meds:.acetaminophen, bisacodyl, diphenhydrAMINE, sodium chloride flush    Subjective:   Tanner [name] was seen and examined today.  Little fussy earlier this am as per RN. But he denie any new complaints.   Objective:   Vitals:   05/03/23 0615 05/03/23 0826 05/03/23 1140 05/03/23 1716  BP: 124/62 137/85 (!) 143/75 133/68  Pulse: 70 81 79 68  Resp: 18   18  Temp: 97.9 F (36.6 C)      TempSrc: Oral     SpO2: 96%   100%  Weight:      Height:        Intake/Output Summary (Last 24 hours) at 05/03/2023 1717 Last data filed at 05/03/2023 0500 Gross per 24 hour  Intake --  Output 250 ml  Net -250 ml   Filed Weights   04/24/23 2122 04/25/23 0210  Weight: 111 kg 100.4 kg     Exam General exam: Appears calm and comfortable  Respiratory system: Clear to auscultation. Respiratory effort normal. Cardiovascular system: S1 & S2 heard, RRR. No JVD, Gastrointestinal system: Abdomen is nondistended, soft and nontender.  Central nervous system: Alert and oriented.  Extremities: pedal edema present.  Skin: No rashes,  Psychiatry: Mood & affect appropriate.         Data Reviewed:  I have personally reviewed following labs and imaging studies   CBC Lab Results  Component Value Date   WBC 10.7 (H) 05/01/2023   RBC 3.88 (L) 05/01/2023   HGB 9.3 (L) 05/01/2023   HCT 32.1 (L) 05/01/2023   MCV 82.7 05/01/2023   MCH 24.0 (L) 05/01/2023   PLT 259 05/01/2023   MCHC 29.0 (L) 05/01/2023   RDW 19.2 (H) 05/01/2023   LYMPHSABS 1.0 05/01/2023   MONOABS 0.7 05/01/2023   EOSABS 0.7 (H) 05/01/2023   BASOSABS 0.1 05/01/2023     Last metabolic panel Lab Results  Component Value Date   NA 140 05/03/2023   K 3.4 (L) 05/03/2023   CL 103 05/03/2023   CO2 28 05/03/2023   BUN 28 (H) 05/03/2023   CREATININE 2.39 (H) 05/03/2023   GLUCOSE 88 05/03/2023   GFRNONAA 27 (L) 05/03/2023   GFRAA 55 (L) 01/23/2020   CALCIUM 8.6 (L) 05/03/2023   PHOS 3.4 04/28/2023   PROT 5.4 (L) 04/24/2023   ALBUMIN 2.4 (L) 04/24/2023  LABGLOB 1.8 01/23/2020   AGRATIO 2.3 (H) 01/23/2020   BILITOT 0.7 04/24/2023   ALKPHOS 74 04/24/2023   AST 17 04/24/2023   ALT 15 04/24/2023   ANIONGAP 9 05/03/2023    CBG (last 3)  Recent Labs    05/02/23 1149  GLUCAP 125*      Coagulation Profile: No results for input(s): "INR", "PROTIME" in the last 168 hours.    Radiology Studies: No  results found.     Kathlen Mody M.D. [facility] 05/03/2023, 5:17 PM  Available via Epic secure chat 7am-7pm After 7 pm, please refer to night coverage provider listed on amion.

## 2023-05-03 NOTE — Discharge Instructions (Signed)

## 2023-05-03 NOTE — Progress Notes (Signed)
SLP Cancellation Note  Patient Details Name: Tanner [name] MRN: [id number] DOB: 03-15-1946   Cancelled treatment:       Reason Eval/Treat Not Completed: Other (comment) Pt working with mobility team  Tanner [name], [name] 05/03/2023, 2:38 PM

## 2023-05-03 NOTE — Plan of Care (Signed)

## 2023-05-03 NOTE — TOC Progression Note (Addendum)
Transition of Care [facility]) - Progression Note    Patient Details  Name: Tanner [name] MRN: [id number] Date of Birth: 1945/12/27  Transition of [facility]) CM/SW Contact  Yasamin Karel A [place], [place] Phone Number: 05/03/2023, 10:09 AM  Clinical Narrative:     Pt' authorization request approved. Dates:  11/18 - 11/24 NRD 11/25   WUJ#[id number]  Facility and provided updated .   Pt's insurance [name] is still pending. Pt able to DC once auth is completed.   Bed at facility available tomorrow.   TOC will continue to follow.   Expected Discharge Plan: Skilled Nursing Facility Barriers to Discharge: Continued Medical Work up, English as a second language teacher  Expected Discharge Plan and Services                                               Social Determinants of Health (SDOH) Interventions SDOH Screenings   Food Insecurity: No Food Insecurity (04/25/2023)  Housing: Low Risk  (04/25/2023)  Transportation Needs: No Transportation Needs (04/25/2023)  Utilities: Not At Risk (04/25/2023)  Alcohol Screen: Low Risk  (10/27/2022)  Depression (PHQ2-9): Low Risk  (01/04/2023)  Financial Resource Strain: Low Risk  (10/27/2022)  Physical Activity: Inactive (10/27/2022)  Social Connections: Moderately Integrated (10/27/2022)  Stress: No Stress Concern Present (12/31/2022)  Recent Concern: Stress - Stress Concern Present (10/30/2022)  Tobacco Use: Medium Risk (04/28/2023)    Readmission Risk Interventions    08/20/2022   11:10 AM  Readmission Risk Prevention Plan  Transportation Screening Complete  PCP or Specialist Appt within 3-5 Days Complete  HRI or Home Care Consult Complete  Palliative Care Screening Not Applicable  Medication Review (RN Care Manager) Complete

## 2023-05-03 NOTE — Progress Notes (Addendum)
Mobility Specialist Progress Note:    05/03/23 1430  Mobility  Activity Ambulated with assistance in room  Level of Assistance Moderate assist, patient does 50-74%  Assistive Device Front wheel walker  Distance Ambulated (ft) 30 ft  RLE Weight Bearing WBAT  Activity Response Tolerated well  Mobility Referral Yes  $Mobility charge 1 Mobility  Mobility Specialist Start Time (ACUTE ONLY) 1430  Mobility Specialist Stop Time (ACUTE ONLY) 1445  Mobility Specialist Time Calculation (min) (ACUTE ONLY) 15 min   Pt received in chair, eager for mobility session. Ambulated in room with RW, ModA required to stand. MinA/ CGA during ambulation. Took seated rest break on way back to chair from door d/t knee weakness. ModA again to stand and return to chair. Call bell in reach, all needs met.   Feliciana Rossetti Mobility Specialist Please contact via Special educational needs teacher or  Rehab office at [phone]

## 2023-05-04 ENCOUNTER — Inpatient Hospital Stay (HOSPITAL_COMMUNITY): Payer: No Typology Code available for payment source

## 2023-05-04 LAB — BASIC METABOLIC PANEL
Anion gap: 9 (ref 5–15)
BUN: 29 mg/dL — ABNORMAL HIGH (ref 8–23)
CO2: 26 mmol/L (ref 22–32)
Calcium: 9 mg/dL (ref 8.9–10.3)
Chloride: 106 mmol/L (ref 98–111)
Creatinine, Ser: 2.4 mg/dL — ABNORMAL HIGH (ref 0.61–1.24)
GFR, Estimated: 27 mL/min — ABNORMAL LOW (ref >60)
Glucose, Bld: 102 mg/dL — ABNORMAL HIGH (ref 70–99)
Potassium: 4 mmol/L (ref 3.5–5.1)
Sodium: 141 mmol/L (ref 135–145)

## 2023-05-04 LAB — URINALYSIS, ROUTINE W REFLEX MICROSCOPIC
Bilirubin Urine: NEGATIVE
Glucose, UA: NEGATIVE mg/dL
Hgb urine dipstick: NEGATIVE
Ketones, ur: NEGATIVE mg/dL
Leukocytes,Ua: NEGATIVE
Nitrite: NEGATIVE
Protein, ur: 100 mg/dL — AB
Specific Gravity, Urine: 1.016 (ref 1.005–1.030)
pH: 5 (ref 5.0–8.0)

## 2023-05-04 LAB — CBC WITH DIFFERENTIAL/PLATELET
Abs Immature Granulocytes: 0.3 10*3/uL — ABNORMAL HIGH (ref 0.00–0.07)
Basophils Absolute: 0.1 10*3/uL (ref 0.0–0.1)
Basophils Relative: 1 %
Eosinophils Absolute: 0.6 10*3/uL — ABNORMAL HIGH (ref 0.0–0.5)
Eosinophils Relative: 6 %
HCT: 35.9 % — ABNORMAL LOW (ref 39.0–52.0)
Hemoglobin: 10.6 g/dL — ABNORMAL LOW (ref 13.0–17.0)
Immature Granulocytes: 3 %
Lymphocytes Relative: 14 %
Lymphs Abs: 1.4 10*3/uL (ref 0.7–4.0)
MCH: 24.4 pg — ABNORMAL LOW (ref 26.0–34.0)
MCHC: 29.5 g/dL — ABNORMAL LOW (ref 30.0–36.0)
MCV: 82.7 fL (ref 80.0–100.0)
Monocytes Absolute: 0.6 10*3/uL (ref 0.1–1.0)
Monocytes Relative: 6 %
Neutro Abs: 6.8 10*3/uL (ref 1.7–7.7)
Neutrophils Relative %: 70 %
Platelets: 219 10*3/uL (ref 150–400)
RBC: 4.34 MIL/uL (ref 4.22–5.81)
RDW: 19.2 % — ABNORMAL HIGH (ref 11.5–15.5)
WBC: 9.8 10*3/uL (ref 4.0–10.5)
nRBC: 0 % (ref 0.0–0.2)

## 2023-05-04 LAB — CREATININE, URINE, RANDOM: Creatinine, Urine: 120 mg/dL

## 2023-05-04 MED ORDER — SODIUM CHLORIDE 0.9 % IV SOLN: INTRAVENOUS | Status: DC

## 2023-05-04 NOTE — Plan of Care (Signed)

## 2023-05-04 NOTE — Progress Notes (Signed)
[facility]  Date of Admission:  04/24/2023     Principal Problem:   Acute hypoxic respiratory failure (HCC) Active Problems:   Essential hypertension   History of pulmonary embolus (PE)   Morbid obesity (HCC)   DM2 (diabetes mellitus, type 2) (HCC)   Multifocal pneumonia   Chronic systolic CHF (congestive heart failure) (HCC)   Sepsis (HCC)   Acute metabolic encephalopathy   History of MRSA infection   Lung nodule          Assessment: 77 year old male with polymicrobial bacteremia with E faecalis and MRSA secondary to right TKA status post debridement and poly exchange on 10/23 discharged on daptomycin to complete 6 weeks then transition to p.o. Doxy for chronic suppression admitted for: #Suspected daptomycin induced eosinophilia - Noted that he had tach cough x 1 week prior to admission.  CT showed right greater than left multifocal patchy opacity.  No peripheral eosinophilia.  ID engaged patient/vancomycin - Patient underwent bronchoscopy on 11/14 with cultures no growth with only 3% eosinophils and cell count.  Completed about 7 days of cefepime/bank for possible HAP. Marland Kitchen  Recommendations: -Okay to complete antibiotic course with linezolid t EOT 12/4 p.o. suppression with doxycycline.  Although patient had bacteremia with E faecalis and MRSA, cultures from the OR only grew MRSA as such we will target with doxycycline for MRSA. - Follow-up with infectious disease on 11/27  ID will sign off  Microbiology:   Antibiotics: Cefepime 11/9 - 11/16 Vancomycin 11/9 - 11/14 Linezolid 11/15-present  SUBJECTIVE: In chair.  No new complaints. Interval: Febrile overnight.  Review of Systems: Review of Systems  All other systems reviewed and are negative.    Scheduled Meds:  apixaban  5 mg Oral BID   buPROPion  150 mg Oral BID   carvedilol  6.25 mg Oral BID WC   Chlorhexidine Gluconate Cloth  6 each Topical Daily   finasteride  5 mg Oral  QHS   gabapentin  300 mg Oral TID   linezolid  600 mg Oral Q12H   loratadine  10 mg Oral Daily   nortriptyline  20 mg Oral QHS   pantoprazole  40 mg Oral Daily   polyethylene glycol  17 g Oral Daily   senna  2 tablet Oral QHS   sodium chloride flush  10-40 mL Intracatheter Q12H   Continuous Infusions: PRN Meds:.acetaminophen, bisacodyl, diphenhydrAMINE, sodium chloride flush Allergies  Allergen Reactions   Lisinopril Other (See Comments)     Renal impairment   Amlodipine Swelling   Codeine Sulfate Itching and Nausea Only    OBJECTIVE: Vitals:   05/03/23 2103 05/04/23 0412 05/04/23 0544 05/04/23 0752  BP: (!) 119/53 129/60  (!) 126/54  Pulse: 84 87  74  Resp: 18 17  18   Temp: 97.8 F (36.6 C) 97.9 F (36.6 C)  97.9 F (36.6 C)  TempSrc: Oral Oral    SpO2: 91% (!) 88% 94% 100%  Weight:      Height:       Body mass index is 37.99 kg/m.  Physical Exam Constitutional:      General: He is not in acute distress.    Appearance: He is normal weight. He is not toxic-appearing.  HENT:     Head: Normocephalic and atraumatic.     Right Ear: External ear normal.     Left Ear: External ear normal.     Nose: No congestion or rhinorrhea.  Mouth/Throat:     Mouth: Mucous membranes are moist.     Pharynx: Oropharynx is clear.  Eyes:     Extraocular Movements: Extraocular movements intact.     Conjunctiva/sclera: Conjunctivae normal.     Pupils: Pupils are equal, round, and reactive to light.  Cardiovascular:     Rate and Rhythm: Normal rate and regular rhythm.     Heart sounds: No murmur heard.    No friction rub. No gallop.  Pulmonary:     Effort: Pulmonary effort is normal.     Breath sounds: Normal breath sounds.  Abdominal:     General: Abdomen is flat. Bowel sounds are normal.     Palpations: Abdomen is soft.  Musculoskeletal:        General: No swelling.     Cervical back: Normal range of motion and neck supple.  Skin:    General: Skin is warm and dry.   Neurological:     General: No focal deficit present.     Mental Status: He is oriented to person, place, and time.  Psychiatric:        Mood and Affect: Mood normal.       Lab Results Lab Results  Component Value Date   WBC 9.8 05/04/2023   HGB 10.6 (L) 05/04/2023   HCT 35.9 (L) 05/04/2023   MCV 82.7 05/04/2023   PLT 219 05/04/2023    Lab Results  Component Value Date   CREATININE 2.40 (H) 05/04/2023   BUN 29 (H) 05/04/2023   NA 141 05/04/2023   K 4.0 05/04/2023   CL 106 05/04/2023   CO2 26 05/04/2023    Lab Results  Component Value Date   ALT 15 04/24/2023   AST 17 04/24/2023   ALKPHOS 74 04/24/2023   BILITOT 0.7 04/24/2023        Danelle Earthly, MD [facility] 05/04/2023, 2:27 PM  I have personally spent 52 minutes involved in face-to-face and non-face-to-face activities for this patient on the day of the visit. Professional time spent includes the following activities: Preparing to see the patient (review of tests), Obtaining and/or reviewing separately obtained history (admission/discharge record), Performing a medically appropriate examination and/or evaluation , Ordering medications/tests/procedures, referring and communicating with other health care professionals, Documenting clinical information in the EMR, Independently interpreting results (not separately reported), Communicating results to the patient/family/caregiver, Counseling and educating the patient/family/caregiver and Care coordination (not separately reported).

## 2023-05-04 NOTE — Progress Notes (Signed)
Mobility Specialist Progress Note:   05/04/23 1430  Mobility  Activity Transferred from bed to chair  Level of Assistance Contact guard assist, steadying assist  Assistive Device Front wheel walker  Distance Ambulated (ft) 5 ft  Activity Response Tolerated well  Mobility Referral Yes  $Mobility charge 1 Mobility  Mobility Specialist Start Time (ACUTE ONLY) 1430  Mobility Specialist Stop Time (ACUTE ONLY) 1445  Mobility Specialist Time Calculation (min) (ACUTE ONLY) 15 min   Pt agreeable to mobility session. Required only minG to stand and pivot to chair. Pt continues to display decr cognition, however able to follow instructions. Left with all needs met, alarm on.   Addison Lank Mobility Specialist Please contact via SecureChat or  Rehab office at [phone]

## 2023-05-04 NOTE — Plan of Care (Signed)
  Problem: Education: Goal: Knowledge of General Education information will improve Description: Including pain rating scale, medication(s)/side effects and non-pharmacologic comfort measures 05/04/2023 0506 by Scheryl Darter, LPN Outcome: Progressing 05/04/2023 0505 by Scheryl Darter, LPN Outcome: Progressing   Problem: Health Behavior/Discharge Planning: Goal: Ability to manage health-related needs will improve 05/04/2023 0506 by Scheryl Darter, LPN Outcome: Progressing 05/04/2023 0505 by Nancy Nordmann R, LPN Outcome: Progressing   Problem: Clinical Measurements: Goal: Ability to maintain clinical measurements within normal limits will improve 05/04/2023 0506 by Scheryl Darter, LPN Outcome: Progressing 05/04/2023 0505 by Nancy Nordmann R, LPN Outcome: Progressing Goal: Will remain free from infection 05/04/2023 0506 by Scheryl Darter, LPN Outcome: Progressing 05/04/2023 0505 by Nancy Nordmann R, LPN Outcome: Progressing Goal: Diagnostic test results will improve 05/04/2023 0506 by Scheryl Darter, LPN Outcome: Progressing 05/04/2023 0505 by Nancy Nordmann R, LPN Outcome: Progressing Goal: Respiratory complications will improve 05/04/2023 0506 by Scheryl Darter, LPN Outcome: Progressing 05/04/2023 0505 by Nancy Nordmann R, LPN Outcome: Progressing Goal: Cardiovascular complication will be avoided 05/04/2023 0506 by Scheryl Darter, LPN Outcome: Progressing 05/04/2023 0505 by Scheryl Darter, LPN Outcome: Progressing   Problem: Activity: Goal: Risk for activity intolerance will decrease 05/04/2023 0506 by Nancy Nordmann R, LPN Outcome: Progressing 05/04/2023 0505 by Nancy Nordmann R, LPN Outcome: Progressing   Problem: Nutrition: Goal: Adequate nutrition will be maintained 05/04/2023 0506 by Scheryl Darter, LPN Outcome: Progressing 05/04/2023 0505 by Nancy Nordmann R, LPN Outcome: Progressing   Problem: Coping: Goal: Level of anxiety will decrease 05/04/2023  0506 by Scheryl Darter, LPN Outcome: Progressing 05/04/2023 0505 by Scheryl Darter, LPN Outcome: Progressing   Problem: Elimination: Goal: Will not experience complications related to bowel motility 05/04/2023 0506 by Scheryl Darter, LPN Outcome: Progressing 05/04/2023 0505 by Scheryl Darter, LPN Outcome: Progressing Goal: Will not experience complications related to urinary retention 05/04/2023 0506 by Scheryl Darter, LPN Outcome: Progressing 05/04/2023 0505 by Nancy Nordmann R, LPN Outcome: Progressing   Problem: Pain Management: Goal: General experience of comfort will improve 05/04/2023 0506 by Scheryl Darter, LPN Outcome: Progressing 05/04/2023 0505 by Scheryl Darter, LPN Outcome: Progressing   Problem: Safety: Goal: Ability to remain free from injury will improve 05/04/2023 0506 by Scheryl Darter, LPN Outcome: Progressing 05/04/2023 0505 by Scheryl Darter, LPN Outcome: Progressing   Problem: Skin Integrity: Goal: Risk for impaired skin integrity will decrease 05/04/2023 0506 by Scheryl Darter, LPN Outcome: Progressing 05/04/2023 0505 by Scheryl Darter, LPN Outcome: Progressing

## 2023-05-04 NOTE — Progress Notes (Addendum)
[facility]                                                                               Tanner [name], is a 77 y.o. male, DOB - [birth year], ZOX:[id number] Admit date - 04/24/2023    [facility] the [facility]  LOS - 10  days    Brief summary   77 y.o. male with medical history significant for chronic systolic heart failure, paroxysmal atrial fibrillation, history of PE on Eliquis, type 2 diabetes, C. difficile, CKD 3B, morbid obesity, recent bacteremia who presents with altered mental status and hypoxia. Patient was recently hospitalized from 10/22 to 10/30 for prosthetic right knee infection with MRSA/Enterococcus faecalis bacteremia s/p irrigation/debridement with poly exchange on 10/23.  He was followed by infectious disease and orthopedics.  Plan to continue with IV daptomycin till 05/19/23 and then Doxy 100 mg twice daily indefinitely for suppressive therapy. But he was readmitted for fever,  hypoxia with SpO2 87% on 4 L.  Chest x-ray shows persistent bilateral lower lobe airspace opacities. He is being treated for MRSA infection. PCCM consulted and he underwent bronchoscopy and BAL sent, with final cultures showing some candida albicans. ID on board and recommendations given.  Hospital course significant for AKI of unclear etiology. US renal shows solid renal mass. Unable to do an MRI abdomen due to shrapnel. Unable to use contrast due to AKI.     Assessment & Plan    Assessment and Plan: * Acute hypoxic respiratory failure (HCC)/ Multifocal pneumonia.  Initially started the patient on IV vancomycin and cefepime later on transitioned to oral zyvox to complete the course.  PCCM consulted for evaluation of eosinophilic pneumonia from daptomycin  He underwent bronchoscopy today, and BAL sent for analysis for eosinophilic pneumonia vs daptomycin induced lung injury.  ID recommends to continue with zyvox EOT till 05/19/23 followed by  oral suppression with oral doxycycline indefinitely.  Patient remains afebrile and WBC count wnl. Blood cultures have been negative so far.  SLP evaluation ordered to evaluate for aspiration.  Transitioned to regular diet with nectar thick liquids.   Sepsis (HCC) - Presented with fever, tachycardia and leukocytosis with finding of multifocal pneumonia on CT chest - resolved.    History of MRSA infection - Patient was recently hospitalized from 10/22 to 10/30 for prosthetic right knee infection with MRSA/Enterococcus faecalis bacteremia s/p irrigation/debridement with poly exchange on 10/23.  He was followed by infectious disease and orthopedics.  Patient was supposed to be on IV daptomycin till 12/4 and then Doxy 100 mg twice daily indefinitely for suppressive therapy.  ID on board and Transitioned to zyvox 600mg  BID.  BAL cultures rare candida albicans with mixed organisms.   Essential hypertension Blood pressure parameters are optimal.  History of pulmonary embolus (PE) -continue Eliquis - Family is thinking about holding the eliquis in view of his recurrent falls.   Lung nodule -Right lung nodules measuring up to 6 mm. Non-contrast chest CT at 3-6 months is recommended. If the nodules are stable at time of repeat CT, then future CT at 18-24 months.  Acute metabolic encephalopathy -secondary to sepsis and acute hypoxic respiratory  failure secondary to multifocal pneumonia  -Patient appears to be back to baseline he is alert and oriented.  And answering all questions appropriately. -  PT/OT on board, plan for SNF.   Chronic systolic CHF (congestive heart failure) (HCC) -b/l LE edema on exam - chronic. Torsemide held for AKI.   DM2 (diabetes mellitus, type 2) (HCC) -controlled. A1C of 5.3 last month  CBG (last 3)  Recent Labs    05/02/23 1149  GLUCAP 125*   Diet liberalized.    Morbid obesity (HCC) BMI of 42  Anemia of chronic disease.  Hemoglobin of 10.6 today.     Hypokalemia Replaced and repeat level within normal limits   Acute on Stage IIIb CKD Unclear if its from vancomycin vs dehydration.  Torsemide discontinued. Creatinine up to 2.4 from baseline of 1.7 from 2 days ago.  He denies any urinary symptoms. UA Ordered and pending.  He was empirically started on IV fluids. Rpeat renal parameters in am. If creatinine improved he is good to be discharged to [facility].  US renal shows left renal mass in the mid portion. Ordered MRI abdomen, but it couldn't be done due to shrapnel, ct with contrast couldn't be done due to AKI.  CT ABD without contrast ordered for further evaluation.     Paroxysmal atrial fibrillation Patient is on Eliquis anticoagulation Continue with Coreg 6.25 mg twice daily   Constipation:  Resolved with dulcolax suppository.   RN Pressure Injury Documentation: Pressure Injury 03/18/23 Buttocks Left;Right Stage 2 -  Partial thickness loss of dermis presenting as a shallow open injury with a red, pink wound bed without slough. (Active)  03/18/23 0629  Location: Buttocks  Location Orientation: Left;Right  Staging: Stage 2 -  Partial thickness loss of dermis presenting as a shallow open injury with a red, pink wound bed without slough.  Wound Description (Comments):   Present on Admission: Yes  Dressing Type Foam - Lift dressing to assess site every shift 05/03/23 2300      Estimated body mass index is 37.99 kg/m as calculated from the following:   Height as of this encounter: 5\' 4"  (1.626 m).   Weight as of this encounter: 100.4 kg.  Code Status: full code.  DVT Prophylaxis:   apixaban (ELIQUIS) tablet 5 mg   Level of Care: Level of care: Telemetry Medical Family Communication: none at bedside. Discussed with family over the phone and updated the results.   Disposition Plan:     Remains inpatient appropriate:  waiting for creatinine to improve.   Procedures:  Bronchoscopy.   Consultants:    PCCM ID  Antimicrobials:   Anti-infectives (From admission, onward)    Start     Dose/Rate Route Frequency Ordered Stop   04/30/23 2000  linezolid (ZYVOX) tablet 600 mg        600 mg Oral Every 12 hours 04/30/23 0900 05/20/23 0959   04/27/23 2000  vancomycin (VANCOCIN) IVPB 1000 mg/200 mL premix  Status:  Discontinued        1,000 mg 200 mL/hr over 60 Minutes Intravenous Every 24 hours 04/27/23 0821 04/27/23 1225   04/27/23 2000  vancomycin (VANCOREADY) IVPB 750 mg/150 mL  Status:  Discontinued        750 mg 150 mL/hr over 60 Minutes Intravenous Every 24 hours 04/27/23 1225 04/30/23 0900   04/25/23 2000  vancomycin (VANCOREADY) IVPB 750 mg/150 mL  Status:  Discontinued        750 mg 150 mL/hr over 60  Minutes Intravenous Every 24 hours 04/25/23 0033 04/27/23 0821   04/25/23 0800  ceFEPIme (MAXIPIME) 2 g in sodium chloride 0.9 % 100 mL IVPB        2 g 200 mL/hr over 30 Minutes Intravenous Every 12 hours 04/25/23 0033 05/01/23 1056   04/24/23 2000  metroNIDAZOLE (FLAGYL) IVPB 500 mg        500 mg 100 mL/hr over 60 Minutes Intravenous  Once 04/24/23 1950 04/24/23 2314   04/24/23 2000  ceFEPIme (MAXIPIME) 2 g in sodium chloride 0.9 % 100 mL IVPB        2 g 200 mL/hr over 30 Minutes Intravenous  Once 04/24/23 1956 04/24/23 2158   04/24/23 2000  vancomycin (VANCOREADY) IVPB 2000 mg/400 mL        2,000 mg 200 mL/hr over 120 Minutes Intravenous  Once 04/24/23 1956 04/25/23 0043        Medications  Scheduled Meds:  apixaban  5 mg Oral BID   buPROPion  150 mg Oral BID   carvedilol  6.25 mg Oral BID WC   Chlorhexidine Gluconate Cloth  6 each Topical Daily   finasteride  5 mg Oral QHS   gabapentin  300 mg Oral TID   linezolid  600 mg Oral Q12H   loratadine  10 mg Oral Daily   nortriptyline  20 mg Oral QHS   pantoprazole  40 mg Oral Daily   polyethylene glycol  17 g Oral Daily   senna  2 tablet Oral QHS   sodium chloride flush  10-40 mL Intracatheter Q12H   Continuous  Infusions:  sodium chloride      PRN Meds:.acetaminophen, bisacodyl, diphenhydrAMINE, sodium chloride flush    Subjective:   Tanner [name] was seen and examined today. No new complaints.   Objective:   Vitals:   05/04/23 0412 05/04/23 0544 05/04/23 0752 05/04/23 1622  BP: 129/60  (!) 126/54 (!) 159/64  Pulse: 87  74 77  Resp: 17  18   Temp: 97.9 F (36.6 C)  97.9 F (36.6 C)   TempSrc: Oral     SpO2: (!) 88% 94% 100% 100%  Weight:      Height:        Intake/Output Summary (Last 24 hours) at 05/04/2023 1748 Last data filed at 05/04/2023 0600 Gross per 24 hour  Intake 10 ml  Output 600 ml  Net -590 ml   Filed Weights   04/24/23 2122 04/25/23 0210  Weight: 111 kg 100.4 kg     Exam General exam: Appears calm and comfortable  Respiratory system: Clear to auscultation. Respiratory effort normal. On RA. Cardiovascular system: S1 & S2 heard, RRR. No JVD,  Gastrointestinal system: Abdomen is nondistended, soft and nontender.  Central nervous system: Alert and oriented. No focal neurological deficits. Extremities: pedal edema present.  Skin: No rashes,  Psychiatry: Mood & affect appropriate.          Data Reviewed:  I have personally reviewed following labs and imaging studies   CBC Lab Results  Component Value Date   WBC 9.8 05/04/2023   RBC 4.34 05/04/2023   HGB 10.6 (L) 05/04/2023   HCT 35.9 (L) 05/04/2023   MCV 82.7 05/04/2023   MCH 24.4 (L) 05/04/2023   PLT 219 05/04/2023   MCHC 29.5 (L) 05/04/2023   RDW 19.2 (H) 05/04/2023   LYMPHSABS 1.4 05/04/2023   MONOABS 0.6 05/04/2023   EOSABS 0.6 (H) 05/04/2023   BASOSABS 0.1 05/04/2023     Last metabolic panel  Lab Results  Component Value Date   NA 141 05/04/2023   K 4.0 05/04/2023   CL 106 05/04/2023   CO2 26 05/04/2023   BUN 29 (H) 05/04/2023   CREATININE 2.40 (H) 05/04/2023   GLUCOSE 102 (H) 05/04/2023   GFRNONAA 27 (L) 05/04/2023   GFRAA 55 (L) 01/23/2020   CALCIUM 9.0 05/04/2023    PHOS 3.4 04/28/2023   PROT 5.4 (L) 04/24/2023   ALBUMIN 2.4 (L) 04/24/2023   LABGLOB 1.8 01/23/2020   AGRATIO 2.3 (H) 01/23/2020   BILITOT 0.7 04/24/2023   ALKPHOS 74 04/24/2023   AST 17 04/24/2023   ALT 15 04/24/2023   ANIONGAP 9 05/04/2023    CBG (last 3)  Recent Labs    05/02/23 1149  GLUCAP 125*      Coagulation Profile: No results for input(s): "INR", "PROTIME" in the last 168 hours.    Radiology Studies: US RENAL  Result Date: 05/04/2023 CLINICAL DATA:  Acute renal injury EXAM: RENAL / URINARY TRACT ULTRASOUND COMPLETE COMPARISON:  04/07/2023 FINDINGS: Right Kidney: Renal measurements: 10.3 x 4.9 x 5.2 cm. = volume: 135 mL. No mass lesion or hydronephrosis is noted. Cortical cysts are noted. Largest of these measures 4.3 cm. There are simple in nature and no further follow-up is recommended. Left Kidney: Renal measurements: 10.9 x 6.8 x 6.3 cm. = volume: 244 mL. No hydronephrosis is noted. Simple cyst measuring up to 7.1 cm is noted in the lower pole of the left kidney stable from the prior exam. There is suggestion of a solid lesion within the midportion of the left kidney which was not noted on the most recent ultrasound but was seen on a recent CT of the lumbar spine. Bladder: Appears normal for degree of bladder distention. Other: None. IMPRESSION: Simple cysts bilaterally.  No follow-up is recommended. Solid mass lesion in the midportion of the left kidney. Further evaluation by means of contrast enhanced CT or MRI is recommended. Electronically Signed   By: Alcide Clever M.D.   On: 05/04/2023 01:02       Kathlen Mody M.D. [facility] 05/04/2023, 5:48 PM  Available via Epic secure chat 7am-7pm After 7 pm, please refer to night coverage provider listed on amion.

## 2023-05-05 DIAGNOSIS — N2889 Other specified disorders of kidney and ureter: Secondary | ICD-10-CM | POA: Diagnosis present

## 2023-05-05 LAB — BASIC METABOLIC PANEL
Anion gap: 7 (ref 5–15)
BUN: 28 mg/dL — ABNORMAL HIGH (ref 8–23)
CO2: 28 mmol/L (ref 22–32)
Calcium: 8.9 mg/dL (ref 8.9–10.3)
Chloride: 109 mmol/L (ref 98–111)
Creatinine, Ser: 2.23 mg/dL — ABNORMAL HIGH (ref 0.61–1.24)
GFR, Estimated: 29 mL/min — ABNORMAL LOW (ref >60)
Glucose, Bld: 94 mg/dL (ref 70–99)
Potassium: 3.8 mmol/L (ref 3.5–5.1)
Sodium: 144 mmol/L (ref 135–145)

## 2023-05-05 MED ORDER — LINEZOLID 600 MG PO TABS: 600.0000 mg | ORAL_TABLET | Freq: Two times a day (BID) | ORAL | Status: AC

## 2023-05-05 MED ORDER — DOXYCYCLINE HYCLATE 100 MG PO CAPS: 100.0000 mg | ORAL_CAPSULE | Freq: Two times a day (BID) | ORAL | Status: AC

## 2023-05-05 MED ORDER — GABAPENTIN 300 MG PO CAPS: 300.0000 mg | ORAL_CAPSULE | Freq: Three times a day (TID) | ORAL | Status: DC

## 2023-05-05 NOTE — Progress Notes (Signed)
Attempted to call report twice to [facility]. Unable to reach receiving nurse at this time.

## 2023-05-05 NOTE — Discharge Summary (Signed)
Physician Discharge Summary   Patient: Tanner [name] MRN: [id number] DOB: 1945/10/06  Admit date:     04/24/2023  Discharge date: 05/05/23  Discharge Physician: Thad Ranger, MD    PCP: [facility]   Recommendations at discharge:   ID recommended to continue Zyvox, end of treatment on 05/19/2023 followed by oral suppression with doxycycline to start on 05/20/2023 Diet: Regular diet with nectar thick liquids Recommend outpatient urology follow-up for incidental finding of renal mass versus hemorrhagic cyst seen on ultrasound and CT abdomen.  Discharge Diagnoses:    Acute hypoxic respiratory failure (HCC)   Sepsis (HCC), POA   History of MRSA infection   Essential hypertension   History of pulmonary embolus (PE)   Morbid obesity (HCC)   DM2 (diabetes mellitus, type 2) (HCC)   Multifocal pneumonia   Chronic systolic CHF (congestive heart failure) (HCC)   Acute metabolic encephalopathy   Lung nodule   Hospital Course: 77 y.o. male with medical history significant for chronic systolic heart failure, paroxysmal atrial fibrillation, history of PE on Eliquis, type 2 diabetes, C. difficile, CKD 3B, morbid obesity, recent bacteremia who presents with altered mental status and hypoxia. Patient was recently hospitalized from 10/22 to 10/30 for prosthetic right knee infection with MRSA/Enterococcus faecalis bacteremia s/p irrigation/debridement with poly exchange on 10/23.  He was followed by infectious disease and orthopedics.  Plan to continue with IV daptomycin till 05/19/23 and then Doxy 100 mg twice daily indefinitely for suppressive therapy. But he was readmitted for fever,  hypoxia with SpO2 87% on 4 L.  Chest x-ray shows persistent bilateral lower lobe airspace opacities. He is being treated for MRSA infection. PCCM consulted and he underwent bronchoscopy and BAL sent, with final cultures showing some candida albicans. ID on board and recommendations given.   Hospital course  significant for AKI of unclear etiology. US renal shows solid renal mass. Unable to do an MRI abdomen due to shrapnel. Unable to use contrast due to AKI.    Assessment and Plan:  Acute hypoxic respiratory failure (HCC)/ Multifocal pneumonia.  -Initially started the patient on IV vancomycin and cefepime.  Later on transitioned to oral zyvox to complete the course.  -PCCM was consulted for evaluation of possibility of eosinophilic pneumonia from daptomycin  -Underwent bronchoscopy on 11/14, per report, BAL- no malignant cells were identified, severe inflammation is present -Patient was seen by ID, Dr. Thedore Mins, suspect daptomycin induced eosinophilia.  Recommended to continue antibiotic course with the linezolid with end of treatment on 05/19/2023, followed by p.o. suppression with doxycycline  -Repeat blood test negative so far -He has follow-up appointment with Dr. Isaiah Serge on 05/12/2023 at 4 PM. -Underwent SLP evaluation, recommended regular diet with nectar thick liquids    Sepsis (HCC), POA - Presented with fever, tachycardia and leukocytosis with finding of multifocal pneumonia on CT chest -Sepsis physiology resolved.      History of MRSA infection - Patient was recently hospitalized from 10/22 to 10/30 for prosthetic right knee infection with MRSA/Enterococcus faecalis bacteremia s/p irrigation/debridement with poly exchange on 10/23.  He was followed by infectious disease and orthopedics.  Patient was supposed to be on IV daptomycin till 12/4 and then Doxy 100 mg twice daily indefinitely for suppressive therapy.  -ID was consulted during this admission and recommended to complete course with Zyvox 600 mg twice daily, end of treatment on 12//24 followed by oral suppression with doxycycline.  -BAL cultures rare candida albicans with mixed organisms.   Acute on Stage IIIb CKD -  Unclear if its from vancomycin vs dehydration.  - Torsemide discontinued. Creatinine up to 2.4 from baseline of  1.7-1.9 -UA negative for UTI, patient was empirically placed on IV fluids. -US renal shows left renal mass in the mid portion. Ordered MRI abdomen, but it couldn't be done due to shrapnel, ct with contrast couldn't be done due to AKI.  CT ABD without contrast showed a 5.3x 5.1 cm mixed attenuation solid-appearing lesion in the midportion of left kidney corresponding to recent ultrasound, may represent solid neoplasm although possibility of hemorrhagic cyst.  Recommended MRI evaluation on an outpatient basis to allow for operative management imaging. -Placed ambulatory referral to urology for follow-up     Essential hypertension Blood pressure parameters are optimal.   History of pulmonary embolus (PE) -continue Eliquis for now - Family is thinking about holding the eliquis in view of his recurrent falls.    Lung nodule -Right lung nodules measuring up to 6 mm. Non-contrast chest CT at 3-6 months is recommended. If the nodules are stable at time of repeat CT, then future CT at 18-24 months.   Acute metabolic encephalopathy -secondary to sepsis and acute hypoxic respiratory failure secondary to multifocal pneumonia  -  PT/OT on board, plan for SNF.  -Currently appears to be back to baseline, alert and oriented, responding to all questions appropriately   Chronic systolic CHF (congestive heart failure) (HCC) -b/l LE edema on exam - chronic. Torsemide held for AKI.    DM2 (diabetes mellitus, type 2) (HCC) -controlled. A1C of 5.3 last month  -Continue regular diet   Morbid obesity (HCC) BMI of 42   Anemia of chronic disease.  Hemoglobin of 10.6      Hypokalemia Resolved     Paroxysmal atrial fibrillation Patient is on Eliquis for anticoagulation Continue with Coreg 6.25 mg twice daily     Constipation:  Resolved, continue MiraLAX, senna  Obesity  Estimated body mass index is 37.99 kg/m as calculated from the following:   Height as of this encounter: 5\' 4"  (1.626 m).    Weight as of this encounter: 100.4 kg.  Pressure injury -Buttocks stage II, POA, -Foam left dressings every shift      Pain control - [place] Controlled Substance Reporting System database was reviewed. and patient was instructed, not to drive, operate heavy machinery, perform activities at heights, swimming or participation in water activities or provide baby-sitting services while on Pain, Sleep and Anxiety Medications; until their outpatient Physician has advised to do so again. Also recommended to not to take more than prescribed Pain, Sleep and Anxiety Medications.  Consultants: Infectious disease Procedures performed: None Disposition: Skilled nursing facility Diet recommendation:  Discharge Diet Orders (From admission, onward)     Start     Ordered   05/05/23 0000  Diet general        05/05/23 1049           Regular diet with nectar thick liquids DISCHARGE MEDICATION: Allergies as of 05/05/2023       Reactions   Lisinopril Other (See Comments)    Renal impairment   Amlodipine Swelling   Codeine Sulfate Itching, Nausea Only        Medication List     STOP taking these medications    daptomycin IVPB Commonly known as: CUBICIN   Jardiance 25 MG Tabs tablet Generic drug: empagliflozin   torsemide 20 MG tablet Commonly known as: DEMADEX       TAKE these medications    acetaminophen  500 MG tablet Commonly known as: TYLENOL Take 1,000 mg by mouth every 8 (eight) hours as needed for moderate pain (pain score 4-6).   buPROPion 150 MG 12 hr tablet Commonly known as: WELLBUTRIN SR Take 150 mg by mouth 2 (two) times daily.   carvedilol 6.25 MG tablet Commonly known as: COREG Take 1 tablet (6.25 mg total) by mouth 2 (two) times daily with a meal.   doxycycline 100 MG capsule Commonly known as: VIBRAMYCIN Take 1 capsule (100 mg total) by mouth 2 (two) times daily. Start doxycycline AFTER completing the course of linezolid (Zyvox) and continue  indefinitely Start taking on: May 20, 2023   Eliquis 5 MG Tabs tablet Generic drug: apixaban Take 5 mg by mouth 2 (two) times daily.   finasteride 5 MG tablet Commonly known as: PROSCAR Take 5 mg by mouth at bedtime.   gabapentin 300 MG capsule Commonly known as: NEURONTIN Take 1 capsule (300 mg total) by mouth 3 (three) times daily. What changed: how much to take   guaiFENesin 100 MG/5ML liquid Commonly known as: ROBITUSSIN Take 5 mLs by mouth every 4 (four) hours as needed for cough or to loosen phlegm.   linezolid 600 MG tablet Commonly known as: ZYVOX Take 1 tablet (600 mg total) by mouth every 12 (twelve) hours for 14 days. End of treatment on 05/19/2023   loratadine 10 MG tablet Commonly known as: CLARITIN Take 10 mg by mouth daily.   multivitamins ther. w/minerals Tabs tablet Take 1 tablet by mouth daily.   nortriptyline 10 MG capsule Commonly known as: PAMELOR Take 20 mg by mouth at bedtime.   pantoprazole 40 MG tablet Commonly known as: PROTONIX Take 1 tablet (40 mg total) by mouth daily at 12 noon.   polyethylene glycol 17 g packet Commonly known as: MIRALAX / GLYCOLAX Take 17 g by mouth daily.   Pro-Stat Liqd Take 30 mLs by mouth in the morning and at bedtime.   senna 8.6 MG Tabs tablet Commonly known as: SENOKOT Take 2 tablets (17.2 mg total) by mouth at bedtime.               Discharge Care Instructions  (From admission, onward)           Start     Ordered   05/05/23 0000  Discharge wound care:       Comments: Wound care  Until discontinued      Comments: 1. Cut piece of Aquacel and apply to left leg wound Q Mon and Thurs.  Cover with foam dressing.  Change foam dressing Q 3 days or PRN soiling. Moisten Aquacel with NS to assist with removal each time.  2. Foam dressing to left upper wound, change Q 3 days or PRN soiling.   05/05/23 1049            Follow-up Information     [facility] Schedule an appointment  as soon as possible for a visit in 2 week(s).   Why: for hospital follow-up Contact information: [address] [place] [address] [phone]         Odette Fraction, MD Follow up on 05/12/2023.   Specialty: Infectious Diseases Why: At 4:00 PM, for hospital follow-up Contact information: [address] [place] [address] [phone]         Maisie Fus, MD Follow up on 05/06/2023.   Specialty: Cardiology Why: At 1:20 PM, for hospital follow-up Contact information: [address] [place]  [place] [address] [phone]         Josph Macho, MD Follow up on 06/02/2023.   Specialty: Oncology Why: At 3:00 pm Contact information: [address] [place] [address] [phone]                Discharge Exam: Ceasar Mons Weights   04/24/23 2122 04/25/23 0210  Weight: 111 kg 100.4 kg   S: No acute complaints, wondering when he will be leaving.  No fevers or chills, no acute pain.  BP (!) 146/63 (BP Location: Right Arm)   Pulse 70   Temp 98 F (36.7 C) (Oral)   Resp 17   Ht 5\' 4"  (1.626 m)   Wt 100.4 kg   SpO2 99%   BMI 37.99 kg/m   Physical Exam General: Alert and oriented x 3, NAD Cardiovascular: S1 S2 clear, RRR.  Respiratory: CTAB, no wheezing, rales Gastrointestinal: Soft, nontender, nondistended, NBS Ext: no pedal edema bilaterally Neuro: no new deficits Psych: Normal affect   Condition at discharge: fair  The results of significant diagnostics from this hospitalization (including imaging, microbiology, ancillary and laboratory) are listed below for reference.   Imaging Studies: CT ABDOMEN PELVIS WO CONTRAST  Result Date: 05/05/2023 CLINICAL DATA:  Possible solid mass within the left kidney EXAM: CT ABDOMEN AND PELVIS WITHOUT CONTRAST TECHNIQUE: Multidetector CT imaging of the abdomen and pelvis was performed following the standard protocol without IV contrast. RADIATION DOSE REDUCTION:  This exam was performed according to the departmental dose-optimization program which includes automated exposure control, adjustment of the mA and/or kV according to patient size and/or use of iterative reconstruction technique. COMPARISON:  Ultrasound from the previous day, CT from 12/19/2013. FINDINGS: Lower chest: Mild scarring is noted in the bases bilaterally. Hepatobiliary: No focal liver abnormality is seen. No gallstones, gallbladder wall thickening, or biliary dilatation. Pancreas: Unremarkable. No pancreatic ductal dilatation or surrounding inflammatory changes. Spleen: Normal in size without focal abnormality. Adrenals/Urinary Tract: Right adrenal myelolipoma is noted stable in appearance from prior CT examination. Left adrenal gland appears within normal limits. Kidneys demonstrate multiple bilateral renal cysts similar to that seen on prior ultrasound and CT examinations. No renal calculi are noted. In the midportion of the left kidney there is a 5.1 x 5.3 cm mixed attenuation lesion similar to that noted on the prior ultrasound examination. Again this likely represents a solid neoplasm till proven otherwise. No obstructive changes are seen. The bladder is within normal limits. Stomach/Bowel: Contrast material is noted within the colon from recent swallowing exam. The appendix appears within normal limits. No obstructive or inflammatory changes of the small bowel are noted. Stomach is within normal limits. Vascular/Lymphatic: Aortic atherosclerosis. No enlarged abdominal or pelvic lymph nodes. Reproductive: Prostate is unremarkable. Other: No abdominal wall hernia or abnormality. No abdominopelvic ascites. Musculoskeletal: Degenerative changes of lumbar spine are noted. IMPRESSION: 5.3 x 5.1 cm mixed attenuation solid appearing lesion in the midportion of the left kidney. This corresponds to that seen on recent ultrasound examination but is incompletely evaluated on this noncontrast study. This may  represent a solid neoplasm although the possibility of hemorrhagic cyst would deserve consideration as well mixed attenuation. Ideally MRI evaluation would be preferred on an outpatient basis to allow for optimum imaging. Renal cysts stable in appearance from previous exams. No follow-up is recommended. No other focal abnormality is noted. Electronically Signed   By: Alcide Clever M.D.   On: 05/05/2023 00:31   US RENAL  Result Date:  05/04/2023 CLINICAL DATA:  Acute renal injury EXAM: RENAL / URINARY TRACT ULTRASOUND COMPLETE COMPARISON:  04/07/2023 FINDINGS: Right Kidney: Renal measurements: 10.3 x 4.9 x 5.2 cm. = volume: 135 mL. No mass lesion or hydronephrosis is noted. Cortical cysts are noted. Largest of these measures 4.3 cm. There are simple in nature and no further follow-up is recommended. Left Kidney: Renal measurements: 10.9 x 6.8 x 6.3 cm. = volume: 244 mL. No hydronephrosis is noted. Simple cyst measuring up to 7.1 cm is noted in the lower pole of the left kidney stable from the prior exam. There is suggestion of a solid lesion within the midportion of the left kidney which was not noted on the most recent ultrasound but was seen on a recent CT of the lumbar spine. Bladder: Appears normal for degree of bladder distention. Other: None. IMPRESSION: Simple cysts bilaterally.  No follow-up is recommended. Solid mass lesion in the midportion of the left kidney. Further evaluation by means of contrast enhanced CT or MRI is recommended. Electronically Signed   By: Alcide Clever M.D.   On: 05/04/2023 01:02   DG Swallowing Func-Speech Pathology  Result Date: 04/30/2023 Table formatting from the original result was not included. Modified Barium Swallow Study Patient Details Name: ABDALRAHMAN MANFORD MRN: [id number] Date of Birth: Jul 30, 1945 Today's Date: 04/30/2023 HPI/PMH: HPI: 77 year old male with extensive past medical history with recent admission for AKI and polymicrobial bacteremia (MRSA, e. Faecalis) from  periprosthetic right knee infection, discharged on daptomycin who presented to the ED on 11/9 with altered mental status. Per EDP note, presented with two days of cough and altered mental status. Son prsented with patient and noted he had fallen three times in the three days prior to presentation. Son also noted him falling asleep during conversation, not acting himself. CT chest with multifocal GGO consistent with multifocal pneumonia. In prior admissio pt diagnosed with oral dysphagia with Bell's Palsy with high risk of aspiration post swallow due to residue. Other PMH including CHF, HTN, HLD, paroxysmal atrial fibrillation, PE/DVT on Eliquis, diabetes, c.difficile, CKD 3, obesity Clinical Impression: Clinical Impression: Pt exhibits high risk of aspiration with liquids and of particulate matter from solids. Pt has oral residue post swallow that he senses and transits, but does not often swallow. This oral residue spills and collects with moderate vallecular residue (base of tongue weakness) and pools in the pyriform sinuses. Pt has no sensation of this. He silently aspirates residue as he is moving or talking, or taking his next sips or bites. He also has aspiraiton of nectar thick liquids before the swallow, but a chin tuck improves timing and helps clear vallecualr residue. Oral residue with nectar is minimal, suspect due to a more cohesive bolus. Pt is unable to complete a second dry in swallow despite heavy verbal and visual cues. He moves his head all around and thrusts his tongue but rarely achieves a second swallow. A chin tuck with nectar thick liquids and regualr solids is recommended. Pt will benefit from training a second swallow, improving cough mechanics and learing a preventative cough given poor sensation. Risk of aspiration will persist. Factors that may increase risk of adverse event in presence of aspiration Rubye Oaks [employer] Clearance Coots 2021): Factors that may increase risk of adverse event in presence of  aspiration Rubye Oaks [employer] Clearance Coots 2021): Limited mobility; Frail or deconditioned; Reduced cognitive function; Inadequate oral hygiene; Weak cough; Aspiration of thick, dense, and/or acidic materials Recommendations/Plan: Swallowing Evaluation Recommendations Swallowing Evaluation Recommendations Recommendations: PO diet PO  Diet Recommendation: Regular; Mildly thick liquids (Level 2, nectar thick) Liquid Administration via: Straw; Cup Medication Administration: Whole meds with puree Supervision: Patient able to self-feed Swallowing strategies  : Slow rate; Small bites/sips; Chin tuck; Follow solids with liquids; Hard cough after swallowing Postural changes: Position pt fully upright for meals Oral care recommendations: Oral care BID (2x/day) Treatment Plan Treatment Plan Treatment recommendations: Therapy as outlined in treatment plan below Follow-up recommendations: Skilled nursing-short term rehab (<3 hours/day) Functional status assessment: Patient has had a recent decline in their functional status and demonstrates the ability to make significant improvements in function in a reasonable and predictable amount of time. Treatment frequency: Min 2x/week Treatment duration: 2 weeks Interventions: Aspiration precaution training; Patient/family education; Trials of upgraded texture/liquids; Diet toleration management by SLP; Compensatory techniques Recommendations Recommendations for follow up therapy are one component of a multi-disciplinary discharge planning process, led by the attending physician.  Recommendations may be updated based on patient status, additional functional criteria and insurance authorization. Assessment: Orofacial Exam: Orofacial Exam Oral Cavity: Oral Hygiene: Xerostomia Oral Cavity - Dentition: Dentures, top; Dentures, bottom Orofacial Anatomy: WFL Oral Motor/Sensory Function: Suspected cranial nerve impairment CN V - Trigeminal: Right motor impairment CN VII - Facial: Right motor impairment CN IX  - Glossopharyngeal, CN X - Vagus: WFL CN XII - Hypoglossal: WFL Anatomy: Anatomy: WFL Boluses Administered: Boluses Administered Boluses Administered: Thin liquids (Level 0); Mildly thick liquids (Level 2, nectar thick); Solid; Puree  Oral Impairment Domain: Oral Impairment Domain Lip Closure: No labial escape Tongue control during bolus hold: Cohesive bolus between tongue to palatal seal Bolus preparation/mastication: Slow prolonged chewing/mashing with complete recollection Bolus transport/lingual motion: Repetitive/disorganized tongue motion Oral residue: Residue collection on oral structures Location of oral residue : Floor of mouth; Tongue; Lateral sulci Initiation of pharyngeal swallow : Pyriform sinuses  Pharyngeal Impairment Domain: Pharyngeal Impairment Domain Soft palate elevation: No bolus between soft palate (SP)/pharyngeal wall (PW) Laryngeal elevation: Complete superior movement of thyroid cartilage with complete approximation of arytenoids to epiglottic petiole Anterior hyoid excursion: Complete anterior movement Epiglottic movement: Complete inversion Laryngeal vestibule closure: Complete, no air/contrast in laryngeal vestibule Pharyngeal stripping wave : Present - complete Pharyngeal contraction (A/P view only): N/A Pharyngoesophageal segment opening: Complete distension and complete duration, no obstruction of flow Tongue base retraction: Narrow column of contrast or air between tongue base and PPW Pharyngeal residue: Collection of residue within or on pharyngeal structures Location of pharyngeal residue: Valleculae  Esophageal Impairment Domain: No data recorded Pill: No data recorded Penetration/Aspiration Scale Score: Penetration/Aspiration Scale Score 1.  Material does not enter airway: Puree; Solid 4.  Material enters airway, CONTACTS cords then ejected out: Mildly thick liquids (Level 2, nectar thick) 7.  Material enters airway, passes BELOW cords and not ejected out despite cough attempt by  patient: Thin liquids (Level 0) 8.  Material enters airway, passes BELOW cords without attempt by patient to eject out (silent aspiration) : Thin liquids (Level 0) Compensatory Strategies: Compensatory Strategies Compensatory strategies: Yes Straw: Ineffective Effortful swallow: Ineffective Multiple swallows: Ineffective Chin tuck: Effective Effective Chin Tuck: Mildly thick liquid (Level 2, nectar thick) Liquid wash: Effective Effective Liquid Wash: Mildly thick liquid (Level 2, nectar thick)   General Information: No data recorded Diet Prior to this Study: Regular; Thin liquids (Level 0)   Temperature : Normal   No data recorded  No data recorded  History of Recent Intubation: No  Behavior/Cognition: Alert; Cooperative; Pleasant mood Self-Feeding Abilities: Able to self-feed Baseline vocal quality/speech: Normal  Volitional Cough: Able to elicit Volitional Swallow: Unable to elicit (struggles to elicit) No data recorded Goal Planning: Prognosis for improved oropharyngeal function: Fair Barriers to Reach Goals: Cognitive deficits No data recorded No data recorded Consulted and agree with results and recommendations: Patient Pain: Pain Assessment Pain Assessment: Faces Faces Pain Scale: 2 Pain Location: R knee Pain Descriptors / Indicators: Grimacing Pain Intervention(s): Monitored during session; Repositioned End of Session: Start Time:SLP Start Time (ACUTE ONLY): 1330 Stop Time: SLP Stop Time (ACUTE ONLY): 1345 Time Calculation:SLP Time Calculation (min) (ACUTE ONLY): 15 min Charges: SLP Evaluations $ SLP Speech Visit: 1 Visit SLP Evaluations $BSS Swallow: 1 Procedure $MBS Swallow: 1 Procedure $Swallowing Treatment: 1 Procedure SLP visit diagnosis: SLP Visit Diagnosis: Dysphagia, oropharyngeal phase (R13.12) Past Medical History: Past Medical History: Diagnosis Date  Acute kidney failure (HCC)   Anxiety   Arthritis   Back pain   BPH (benign prostatic hypertrophy)   CHF (congestive heart failure) (HCC)   Clostridium  difficile infection   Clotting disorder (HCC)   Depression   Diabetes (HCC) 12/11/2016  type 2   DVT (deep venous thrombosis) (HCC)   DVT of deep femoral vein, right (HCC) 06/29/2018  Edema, lower extremity   High cholesterol   Hypertension   Joint pain   Low back pain potentially associated with spinal stenosis   Neuromuscular disorder (HCC)   Neuropathy of lower extremity   bilateral  OSA (obstructive sleep apnea)   pt denies   Osteoarthritis   PE (pulmonary embolism)   Pneumonia   hx of x 2   Ventral hernia  Past Surgical History: Past Surgical History: Procedure Laterality Date  CARDIOVERSION N/A 08/19/2022  Procedure: CARDIOVERSION;  Surgeon: Meriam Sprague, MD;  Location: [facility] ENDOSCOPY;  Service: Cardiovascular;  Laterality: N/A;  EYE SURGERY    HERNIA REPAIR  1999  I & D KNEE WITH POLY EXCHANGE Right 04/07/2023  Procedure: IRRIGATION AND DEBRIDEMENT KNEE WITH POLY EXCHANGE;  Surgeon: Durene Romans, MD;  Location: [facility] OR;  Service: Orthopedics;  Laterality: Right;  TOTAL KNEE ARTHROPLASTY Left 08/26/2020  Procedure: LEFT TOTAL KNEE ARTHROPLASTY;  Surgeon: Gean Birchwood, MD;  Location: [facility] ORS;  Service: Orthopedics;  Laterality: Left;  TOTAL KNEE ARTHROPLASTY Right 12/15/2022  Procedure: TOTAL KNEE ARTHROPLASTY;  Surgeon: Durene Romans, MD;  Location: [facility] ORS;  Service: Orthopedics;  Laterality: Right;  TRANSESOPHAGEAL ECHOCARDIOGRAM (CATH LAB) N/A 04/12/2023  Procedure: TRANSESOPHAGEAL ECHOCARDIOGRAM;  Surgeon: Chrystie Nose, MD;  Location: [facility] IN[facility]ASIVE [facility] LAB;  Service: Cardiovascular;  Laterality: N/A; [place], [name] 04/30/2023, 2:31 PM   [name] EKG SITE RITE  Result Date: 04/26/2023 If Site Rite image not attached, placement could not be confirmed due to current cardiac rhythm.  CT Chest W Contrast  Result Date: 04/24/2023 CLINICAL DATA:  Shortness of breath EXAM: CT CHEST WITH CONTRAST TECHNIQUE: Multidetector CT imaging of the chest was performed during intravenous contrast administration.  RADIATION DOSE REDUCTION: This exam was performed according to the departmental dose-optimization program which includes automated exposure control, adjustment of the mA and/or kV according to patient size and/or use of iterative reconstruction technique. CONTRAST:  60mL OMNIPAQUE IOHEXOL 350 MG/ML SOLN COMPARISON:  CT angiogram chest 09/23/2017 FINDINGS: Cardiovascular: Right upper extremity PICC terminates in the right brachiocephalic vein. Heart is mildly enlarged. There is no pericardial effusion. Aorta is normal in size. Mediastinum/Nodes: There are enlarged right hilar lymph nodes measuring up to 12 mm. Visualized esophagus and thyroid gland are within normal limits. Lungs/Pleura: Mild emphysema present. Multifocal patchy  airspace and ground-glass opacities are seen throughout the right lung most prominent in the right upper lobe. There also minimal patchy ground-glass and airspace opacities in the inferior left lower lobe. There is no pleural effusion or pneumothorax. There is a 6 mm right lower lobe nodule image 6/93. There are additional 6 mm nodules in the right middle lobe image 6/88. Upper Abdomen: No acute abnormality. Musculoskeletal: Degenerative changes affect the spine. IMPRESSION: 1. Multifocal patchy airspace and ground-glass opacities throughout the right lung and inferior left lower lobe compatible with multifocal pneumonia. 2. Right hilar lymphadenopathy, likely reactive. 3. Right lung nodules measuring up to 6 mm. Non-contrast chest CT at 3-6 months is recommended. If the nodules are stable at time of repeat CT, then future CT at 18-24 months (from today's scan) is considered optional for low-risk patients, but is recommended for high-risk patients. This recommendation follows the consensus statement: Guidelines for Management of Incidental Pulmonary Nodules Detected on CT Images: From the Fleischner Society 2017; Radiology 2017; 284:228-243. 4. Emphysema. Emphysema (ICD10-J43.9).  Electronically Signed   By: Darliss Cheney M.D.   On: 04/24/2023 23:32   CT Head Wo Contrast  Result Date: 04/24/2023 CLINICAL DATA:  Trauma EXAM: CT HEAD WITHOUT CONTRAST TECHNIQUE: Contiguous axial images were obtained from the base of the skull through the vertex without intravenous contrast. RADIATION DOSE REDUCTION: This exam was performed according to the departmental dose-optimization program which includes automated exposure control, adjustment of the mA and/or kV according to patient size and/or use of iterative reconstruction technique. COMPARISON:  Head CT 04/06/2023 FINDINGS: Brain: No evidence of acute infarction, hemorrhage, hydrocephalus, extra-axial collection or mass lesion/mass effect. Again seen is mild diffuse atrophy and mild periventricular white matter hypodensity, likely chronic small vessel ischemic change. Vascular: Atherosclerotic calcifications are present within the cavernous internal carotid arteries. Skull: Normal. Negative for fracture or focal lesion. Sinuses/Orbits: No acute finding. Other: None. IMPRESSION: 1. No acute intracranial process. 2. Mild diffuse atrophy and mild chronic small vessel ischemic change. Electronically Signed   By: Darliss Cheney M.D.   On: 04/24/2023 21:27   DG Chest Portable 1 View  Result Date: 04/24/2023 CLINICAL DATA:  new oxygen dependent EXAM: PORTABLE CHEST 1 VIEW COMPARISON:  Chest x-ray 04/06/2023, chest x-ray 09/23/2017, chest x-ray 08/18/2022 FINDINGS: Right PICC with tip overlying the proximal superior vena cava. The heart and mediastinal contours are within normal limits. Low lung volumes. Interval worsening of right middle lobe airspace opacity. Persistent bilateral lower lobe airspace opacities. No focal consolidation. No pulmonary edema. Possible trace right pleural effusion. No pneumothorax. No acute osseous abnormality. IMPRESSION: 1. Persistent bilateral lower lobe airspace opacities. Interval worsening of right middle lobe airspace  opacity. Findings could represent infection versus inflammation versus atelectasis versus malignancy. Recommend CT chest with intravenous contrast (not CTPA) for further evaluation. 2. Possible trace right pleural effusion. 3. Right PICC with tip overlying the proximal superior vena cava. Electronically Signed   By: Tish Frederickson M.D.   On: 04/24/2023 21:23   ECHO TEE  Result Date: 04/12/2023    TRANSESOPHOGEAL ECHO REPORT   Patient Name:   CONRAD ABITZ Date of Exam: 04/12/2023 Medical Rec #:  [id number]      Height:       64.0 in Accession #:    [id number]     Weight:       220.5 lb Date of Birth:  05/07/1946      BSA:          2.039 m  Patient Age:    77 years       BP:           101/79 mmHg Patient Gender: M              HR:           71 bpm. Exam Location:  Inpatient Procedure: Transesophageal Echo, Color Doppler and Cardiac Doppler Indications:    Bacteremia  History:        Patient has prior history of Echocardiogram examinations, most                 recent 04/07/2023. CHF; Risk Factors:Sleep Apnea, Hypertension                 and Diabetes. Hx of PE, CKD.  Sonographer:    Milda Smart Referring Phys: Corrin Parker PROCEDURE: After discussion of the risks and benefits of a TEE, an informed consent was obtained from the patient. TEE procedure time was 7 minutes. The transesophogeal probe was passed without difficulty through the esophogus of the patient. Imaged were  obtained with the patient in a supine position. Local oropharyngeal anesthetic was provided with Cetacaine. Sedation performed by different physician. The patient was monitored while under deep sedation. Anesthestetic sedation was provided intravenously  by Anesthesiology: 202.1mg  of Propofol, 100mg  of Lidocaine. Image quality was good. [name] vital signs; including heart rate, blood pressure, and oxygen saturation; remained stable throughout the procedure. The patient developed no complications during the procedure.   IMPRESSIONS  1. Left ventricular ejection fraction, by estimation, is 60 to 65%. The left ventricle has normal function.  2. Right ventricular systolic function is normal. The right ventricular size is normal.  3. No left atrial/left atrial appendage thrombus was detected.  4. The mitral valve is normal in structure. Trivial mitral valve regurgitation.  5. The aortic valve is tricuspid. Aortic valve regurgitation is not visualized.  6. There is mild (Grade II) plaque involving the aortic arch. Conclusion(s)/Recommendation(s): No evidence of vegetation/infective endocarditis on this transesophageael echocardiogram. FINDINGS  Left Ventricle: Left ventricular ejection fraction, by estimation, is 60 to 65%. The left ventricle has normal function. The left ventricular internal cavity size was normal in size. Right Ventricle: The right ventricular size is normal. No increase in right ventricular wall thickness. Right ventricular systolic function is normal. Left Atrium: Left atrial size was normal in size. No left atrial/left atrial appendage thrombus was detected. Right Atrium: Right atrial size was normal in size. Pericardium: There is no evidence of pericardial effusion. Mitral Valve: The mitral valve is normal in structure. Trivial mitral valve regurgitation. Tricuspid Valve: The tricuspid valve is normal in structure. Tricuspid valve regurgitation is trivial. Aortic Valve: The aortic valve is tricuspid. Aortic valve regurgitation is not visualized. Pulmonic Valve: The pulmonic valve was normal in structure. Pulmonic valve regurgitation is not visualized. Aorta: The aortic root and ascending aorta are structurally normal, with no evidence of dilitation. There is mild (Grade II) plaque involving the aortic arch. IAS/Shunts: No atrial level shunt detected by color flow Doppler. Additional Comments: Spectral Doppler performed.  AORTA Ao Asc diam: 2.90 cm Zoila Shutter MD Electronically signed by Zoila Shutter MD  Signature Date/Time: 04/12/2023/6:20:11 PM    Final    EP STUDY  Result Date: 04/12/2023 See surgical note for result.  [name] EKG SITE RITE  Result Date: 04/12/2023 If Site Rite image not attached, placement could not be confirmed due to current cardiac rhythm.  US RENAL  Result  Date: 04/08/2023 CLINICAL DATA:  Acute renal injury EXAM: RENAL / URINARY TRACT ULTRASOUND COMPLETE COMPARISON:  CT from 12/19/2013 FINDINGS: Right Kidney: Renal measurements: 9.0 x 4.0 x 4.5 cm. = volume: 83.4 mL. Renal cysts are noted in the lower pole of the right kidney measuring up to 3.8 cm. These are simple in nature. No follow-up is recommended. Left Kidney: Renal measurements: 10.6 x 4.3 x 5.2 cm. = volume: 125.2 mL. Renal cysts are noted in the lower pole of the left kidney. The largest of these measures 6.8 cm. These are also simple in nature. No further follow-up is recommended. Bladder: Appears normal for degree of bladder distention. Other: None. IMPRESSION: Bilateral simple cysts.  No other focal abnormality is noted. Electronically Signed   By: Alcide Clever M.D.   On: 04/08/2023 01:36   ECHOCARDIOGRAM COMPLETE  Result Date: 04/07/2023    ECHOCARDIOGRAM REPORT   Patient Name:   MOHAMAD WESTGATE Date of Exam: 04/07/2023 Medical Rec #:  [id number]      Height:       64.0 in Accession #:    [id number]     Weight:       222.2 lb Date of Birth:  1945-06-23      BSA:          2.046 m Patient Age:    15 years       BP:           157/125 mmHg Patient Gender: M              HR:           73 bpm. Exam Location:  Inpatient Procedure: 2D Echo, Cardiac Doppler, Color Doppler and Intracardiac            Opacification Agent Indications:    Bacteremia  History:        Patient has prior history of Echocardiogram examinations, most                 recent 08/19/2022. Risk Factors:Hypertension.  Sonographer:    Dr [facility] Referring Phys: [id number] [facility] [place]  Sonographer Comments: Suboptimal apical window and patient is obese. IMPRESSIONS   1. Left ventricular ejection fraction, by estimation, is 60 to 65%. The left ventricle has normal function. The left ventricle has no regional wall motion abnormalities. Left ventricular diastolic parameters were normal.  2. Right ventricular systolic function is normal. The right ventricular size is normal.  3. The mitral valve is normal in structure. No evidence of mitral valve regurgitation. No evidence of mitral stenosis.  4. The aortic valve is normal in structure. Aortic valve regurgitation is not visualized. No aortic stenosis is present.  5. The inferior vena cava is normal in size with greater than 50% respiratory variability, suggesting right atrial pressure of 3 mmHg.  6. No obvious vegetations identified; consider TEE if clinically indicated. FINDINGS  Left Ventricle: Left ventricular ejection fraction, by estimation, is 60 to 65%. The left ventricle has normal function. The left ventricle has no regional wall motion abnormalities. Definity contrast agent was given IV to delineate the left ventricular  endocardial borders. The left ventricular internal cavity size was normal in size. There is no left ventricular hypertrophy. Left ventricular diastolic parameters were normal. Right Ventricle: The right ventricular size is normal. No increase in right ventricular wall thickness. Right ventricular systolic function is normal. Left Atrium: Left atrial size was normal in size. Right Atrium: Right atrial size was normal in size. Pericardium: There  is no evidence of pericardial effusion. Mitral Valve: The mitral valve is normal in structure. No evidence of mitral valve regurgitation. No evidence of mitral valve stenosis. Tricuspid Valve: The tricuspid valve is normal in structure. Tricuspid valve regurgitation is not demonstrated. No evidence of tricuspid stenosis. Aortic Valve: The aortic valve is normal in structure. Aortic valve regurgitation is not visualized. No aortic stenosis is present. Aortic valve  mean gradient measures 5.0 mmHg. Aortic valve peak gradient measures 9.9 mmHg. Aortic valve area, by VTI measures 2.44 cm. Pulmonic Valve: The pulmonic valve was normal in structure. Pulmonic valve regurgitation is not visualized. No evidence of pulmonic stenosis. Aorta: The aortic root is normal in size and structure. Venous: The inferior vena cava is normal in size with greater than 50% respiratory variability, suggesting right atrial pressure of 3 mmHg. IAS/Shunts: No atrial level shunt detected by color flow Doppler.  LEFT VENTRICLE PLAX 2D LVIDd:         5.60 cm      Diastology LVIDs:         3.40 cm      LV e' medial:    9.17 cm/s LV PW:         1.10 cm      LV E/e' medial:  10.3 LV IVS:        0.80 cm      LV e' lateral:   12.70 cm/s LVOT diam:     2.20 cm      LV E/e' lateral: 7.5 LV SV:         69 LV SV Index:   34 LVOT Area:     3.80 cm  LV Volumes (MOD) LV vol d, MOD A2C: 115.0 ml LV vol d, MOD A4C: 141.0 ml LV vol s, MOD A2C: 17.7 ml LV vol s, MOD A4C: 26.7 ml LV SV MOD A2C:     97.3 ml LV SV MOD A4C:     141.0 ml LV SV MOD BP:      109.9 ml RIGHT VENTRICLE             IVC RV Basal diam:  4.10 cm     IVC diam: 1.80 cm RV S prime:     15.20 cm/s TAPSE (M-mode): 3.2 cm LEFT ATRIUM           Index        RIGHT ATRIUM           Index LA diam:      3.90 cm 1.91 cm/m   RA Area:     20.50 cm LA Vol (A2C): 65.3 ml 31.92 ml/m  RA Volume:   58.80 ml  28.74 ml/m LA Vol (A4C): 44.2 ml 21.60 ml/m  AORTIC VALVE                     PULMONIC VALVE AV Area (Vmax):    2.61 cm      PV Vmax:       1.27 m/s AV Area (Vmean):   2.46 cm      PV Vmean:      73.600 cm/s AV Area (VTI):     2.44 cm      PV VTI:        0.211 m AV Vmax:           157.00 cm/s   PV Peak grad:  6.5 mmHg AV Vmean:          104.000 cm/s  PV Mean  grad:  3.0 mmHg AV VTI:            0.283 m AV Peak Grad:      9.9 mmHg AV Mean Grad:      5.0 mmHg LVOT Vmax:         108.00 cm/s LVOT Vmean:        67.300 cm/s LVOT VTI:          0.182 m LVOT/AV VTI  ratio: 0.64  AORTA Ao Root diam: 3.00 cm Ao Asc diam:  2.90 cm MITRAL VALVE MV Area (PHT): 3.77 cm    SHUNTS MV Decel Time: 201 msec    Systemic VTI:  0.18 m MV E velocity: 94.70 cm/s  Systemic Diam: 2.20 cm MV A velocity: 98.40 cm/s MV E/A ratio:  0.96 Aditya Sabharwal Electronically signed by Dorthula Nettles Signature Date/Time: 04/07/2023/3:48:24 PM    Final    DG Knee 1-2 Views Right  Result Date: 04/06/2023 CLINICAL DATA:  Fall today, edema. Technologist notes state status post fluid removed from knee at the bedside. EXAM: RIGHT KNEE - 1-2 VIEW COMPARISON:  None Available. FINDINGS: Right knee arthroplasty in expected alignment. No periprosthetic lucency or fracture. Prior patellar resurfacing. No erosive change. Suspected joint effusion with subcutaneous edema anteriorly. Small foci of gas in the anterior soft tissue edema may be related to recent aspiration. IMPRESSION: 1. Right knee arthroplasty without complication. 2. Suspected joint effusion with subcutaneous edema anteriorly. Small foci of gas in the anterior soft tissue edema may be related to recent aspiration. Electronically Signed   By: Narda Rutherford M.D.   On: 04/06/2023 23:12   CT Head Wo Contrast  Result Date: 04/06/2023 CLINICAL DATA:  Head trauma EXAM: CT HEAD WITHOUT CONTRAST CT CERVICAL SPINE WITHOUT CONTRAST TECHNIQUE: Multidetector CT imaging of the head and cervical spine was performed following the standard protocol without intravenous contrast. Multiplanar CT image reconstructions of the cervical spine were also generated. RADIATION DOSE REDUCTION: This exam was performed according to the departmental dose-optimization program which includes automated exposure control, adjustment of the mA and/or kV according to patient size and/or use of iterative reconstruction technique. COMPARISON:  None Available. FINDINGS: CT HEAD FINDINGS Brain: There is no mass, hemorrhage or extra-axial collection. The size and configuration of the  ventricles and extra-axial CSF spaces are normal. There is hypoattenuation of the periventricular white matter, most commonly indicating chronic ischemic microangiopathy. Vascular: No abnormal hyperdensity of the major intracranial arteries or dural venous sinuses. No intracranial atherosclerosis. Skull: The visualized skull base, calvarium and extracranial soft tissues are normal. Sinuses/Orbits: No fluid levels or advanced mucosal thickening of the visualized paranasal sinuses. No mastoid or middle ear effusion. The orbits are normal. CT CERVICAL SPINE FINDINGS Alignment: No static subluxation. Facets are aligned. Occipital condyles are normally positioned. Skull base and vertebrae: No acute fracture. Soft tissues and spinal canal: No prevertebral fluid or swelling. No visible canal hematoma. Disc levels: No advanced spinal canal or neural foraminal stenosis. Upper chest: Mild biapical emphysema. Other: Normal visualized paraspinal cervical soft tissues. IMPRESSION: 1. No acute intracranial abnormality. 2. Chronic ischemic microangiopathy. 3. No acute fracture or static subluxation of the cervical spine. Emphysema (ICD10-J43.9). Electronically Signed   By: Deatra Robinson M.D.   On: 04/06/2023 19:37   CT Cervical Spine Wo Contrast  Result Date: 04/06/2023 CLINICAL DATA:  Head trauma EXAM: CT HEAD WITHOUT CONTRAST CT CERVICAL SPINE WITHOUT CONTRAST TECHNIQUE: Multidetector CT imaging of the head and cervical spine was performed following the standard  protocol without intravenous contrast. Multiplanar CT image reconstructions of the cervical spine were also generated. RADIATION DOSE REDUCTION: This exam was performed according to the departmental dose-optimization program which includes automated exposure control, adjustment of the mA and/or kV according to patient size and/or use of iterative reconstruction technique. COMPARISON:  None Available. FINDINGS: CT HEAD FINDINGS Brain: There is no mass, hemorrhage or  extra-axial collection. The size and configuration of the ventricles and extra-axial CSF spaces are normal. There is hypoattenuation of the periventricular white matter, most commonly indicating chronic ischemic microangiopathy. Vascular: No abnormal hyperdensity of the major intracranial arteries or dural venous sinuses. No intracranial atherosclerosis. Skull: The visualized skull base, calvarium and extracranial soft tissues are normal. Sinuses/Orbits: No fluid levels or advanced mucosal thickening of the visualized paranasal sinuses. No mastoid or middle ear effusion. The orbits are normal. CT CERVICAL SPINE FINDINGS Alignment: No static subluxation. Facets are aligned. Occipital condyles are normally positioned. Skull base and vertebrae: No acute fracture. Soft tissues and spinal canal: No prevertebral fluid or swelling. No visible canal hematoma. Disc levels: No advanced spinal canal or neural foraminal stenosis. Upper chest: Mild biapical emphysema. Other: Normal visualized paraspinal cervical soft tissues. IMPRESSION: 1. No acute intracranial abnormality. 2. Chronic ischemic microangiopathy. 3. No acute fracture or static subluxation of the cervical spine. Emphysema (ICD10-J43.9). Electronically Signed   By: Deatra Robinson M.D.   On: 04/06/2023 19:37   CT Lumbar Spine Wo Contrast  Result Date: 04/06/2023 CLINICAL DATA:  Fall EXAM: CT LUMBAR SPINE WITHOUT CONTRAST TECHNIQUE: Multidetector CT imaging of the lumbar spine was performed without intravenous contrast administration. Multiplanar CT image reconstructions were also generated. RADIATION DOSE REDUCTION: This exam was performed according to the departmental dose-optimization program which includes automated exposure control, adjustment of the mA and/or kV according to patient size and/or use of iterative reconstruction technique. COMPARISON:  CT abdomen pelvis 12/19/2013 FINDINGS: Segmentation: 5 lumbar type vertebrae. Alignment: Right convex  scoliosis with apex at L2. Grade 1 anterolisthesis at L4-5. Vertebrae: Multilevel degenerative endplate remodeling throughout the lumbar spine, but greatest at L1-2 and L2-3. No acute fracture. No evidence of discitis-osteomyelitis. Paraspinal and other soft tissues: Enlarged prostate gland. Calcific aortic atherosclerosis. There is an incompletely visualized left renal contour abnormality that could be a solid mass. Disc levels: L1-2: Disc bulge and facet arthrosis with mild spinal canal stenosis. Mild left foraminal stenosis. L2-3: Disc bulge and facet arthrosis with moderate spinal canal stenosis. L3-4: Disc bulge and facet arthrosis with mild spinal canal stenosis. Mild left foraminal stenosis. L4-5: Severe facet arthrosis with disc bulge and anterolisthesis. Severe spinal canal stenosis. Mild right foraminal stenosis. L5-S1: Unremarkable aside from mild facet arthrosis. IMPRESSION: 1. No acute fracture or static subluxation of the lumbar spine. 2. Severe spinal canal stenosis at L4-5 due to combination of disc bulge, facet arthrosis and grade 1 anterolisthesis. 3. Incompletely visualized left renal contour abnormality that could be a solid mass. Recommend further evaluation with renal ultrasound or CT abdomen pelvis with contrast. Aortic Atherosclerosis (ICD10-I70.0). Electronically Signed   By: Deatra Robinson M.D.   On: 04/06/2023 19:22   DG Chest Portable 1 View  Result Date: 04/06/2023 CLINICAL DATA:  Fall. Woke up on floor beside couch. Unknown down time. EXAM: PORTABLE CHEST 1 VIEW COMPARISON:  Chest radiographs 03/25/2023, 03/18/2023, 08/18/2022, 06/17/2022 FINDINGS: Cardiac silhouette and mediastinal contours are within limits. Mild calcification within the aortic arch. There are right-greater-than-left linear basilar densities that appear not significantly changed from 06/17/2022. Similar densities are seen  on subsequent radiographs, suggesting chronic scarring. No definite acute airspace opacity.  No pleural effusion or pneumothorax. Moderate multilevel degenerative disc changes of the thoracic spine. IMPRESSION: 1. No acute cardiopulmonary process. 2. Right-greater-than-left linear basilar densities that appear not significantly changed from 06/17/2022. Similar densities are seen on subsequent radiographs, suggesting chronic scarring. Electronically Signed   By: Neita Garnet M.D.   On: 04/06/2023 16:44    Microbiology: Results for orders placed or performed during the hospital encounter of 04/24/23  Culture, blood (Routine x 2)     Status: None   Collection Time: 04/24/23  8:33 PM   Specimen: BLOOD LEFT HAND  Result Value Ref Range Status   Specimen Description BLOOD LEFT HAND  Final   Special Requests   Final    BOTTLES DRAWN AEROBIC AND ANAEROBIC Blood Culture adequate volume   Culture   Final    NO GROWTH 5 DAYS Performed at [facility], [address], [place], [place] [address]    Report Status 04/29/2023 FINAL  Final  Culture, blood (Routine x 2)     Status: None   Collection Time: 04/24/23  9:10 PM   Specimen: BLOOD  Result Value Ref Range Status   Specimen Description BLOOD LEFT ANTECUBITAL  Final   Special Requests   Final    BOTTLES DRAWN AEROBIC AND ANAEROBIC Blood Culture adequate volume   Culture   Final    NO GROWTH 5 DAYS Performed at [facility], [address] [place], [place] [address]    Report Status 04/29/2023 FINAL  Final  MRSA Next Gen by PCR, Nasal     Status: Abnormal   Collection Time: 04/26/23 11:07 AM   Specimen: Nasal Mucosa; Nasal Swab  Result Value Ref Range Status   MRSA by PCR Next Gen DETECTED (A) NOT DETECTED Final    Comment: RESULT CALLED TO, READ BACK BY AND VERIFIED WITH: RN D MOSSICT [id number] AT 1204 BY CM (NOTE) The GeneXpert MRSA Assay (FDA approved for NASAL specimens only), is one component of a comprehensive MRSA colonization surveillance program. It is not intended to diagnose MRSA infection nor to guide or  monitor treatment for MRSA infections. Test performance is not FDA approved in patients less than 44 years old. Performed at [facility], [address] [place], [place] [address]   Culture, Respiratory w Gram Stain     Status: None   Collection Time: 04/29/23  8:06 AM   Specimen: Bronchial Alveolar Lavage; Respiratory  Result Value Ref Range Status   Specimen Description BRONCHIAL ALVEOLAR LAVAGE  Final   Special Requests RLL  Final   Gram Stain   Final    ABUNDANT WBC PRESENT, PREDOMINANTLY PMN NO ORGANISMS SEEN    Culture   Final    RARE CANDIDA ALBICANS WITHIN MIXED ORGANISMS Performed at [facility], [address], [place], [place] [address]    Report Status 05/03/2023 FINAL  Final  Acid Fast Smear (AFB)     Status: None   Collection Time: 04/29/23  8:06 AM   Specimen: Bronchial Alveolar Lavage; Respiratory  Result Value Ref Range Status   AFB Specimen Processing Concentration  Final   Acid Fast Smear Negative  Final    Comment: (NOTE) Performed At: [facility] [address] [place], [place] [id number] Jolene Schimke MD XL:[id number]    Source (AFB) BRONCHIAL ALVEOLAR LAVAGE  Final    Comment: Performed at [facility], [address] [place], [place] [address]    Labs: CBC: Recent Labs  Lab 04/30/23 0224 05/01/23 1216 05/04/23 1026  WBC 9.4 10.7* 9.8  NEUTROABS 7.9* 8.1* 6.8  HGB 8.1* 9.3* 10.6*  HCT 28.2* 32.1* 35.9*  MCV 82.2 82.7 82.7  PLT 202 259 219   Basic Metabolic Panel: Recent Labs  Lab 04/30/23 0224 05/01/23 1216 05/03/23 0216 05/04/23 0650 05/05/23 0625  NA 136 138 140 141 144  K 4.0 4.0 3.4* 4.0 3.8  CL 101 100 103 106 109  CO2 26 27 28 26 28   GLUCOSE 138* 165* 88 102* 94  BUN 29* 29* 28* 29* 28*  CREATININE 2.03* 1.97* 2.39* 2.40* 2.23*  CALCIUM 8.7* 8.9 8.6* 9.0 8.9   Liver Function Tests: No results for input(s): "AST", "ALT", "ALKPHOS", "BILITOT", "PROT", "ALBUMIN" in the last 168  hours. CBG: Recent Labs  Lab 04/29/23 0819 05/02/23 1149  GLUCAP 89 125*    Discharge time spent: greater than 30 minutes.  Signed: Thad Ranger, MD [facility] 05/05/2023

## 2023-05-05 NOTE — TOC Transition Note (Signed)
Transition of Care [facility]) - CM/SW Discharge Note   Patient Details  Name: Tanner [name] MRN: [id number] Date of Birth: 01/04/1946  Transition of Care [facility]) CM/SW Contact:  Aamori Mcmasters A [place], Theresia Majors Phone Number: 05/05/2023, 11:10 AM   Clinical Narrative:     Patient will DC to: [facility]  Anticipated DC date: 05/05/23  Family notified: Delight Stare  Transport by: Sharin Mons  Authorization approval:  Dates:  11/18 - 11/24 NRD 11/25    Ref #[id number]     Per MD patient ready for DC to [facility] . RN, patient, patient's family, and facility notified of DC. Discharge Summary and FL2 sent to facility. RN to call report prior to discharge [phone]). DC packet on chart. Ambulance transport requested for patient.     CSW will sign off for now as social work intervention is no longer needed. Please consult [name] again if new needs arise.   Final next level of care: Skilled Nursing Facility Barriers to Discharge: Barriers Resolved   Patient Goals and CMS Choice      Discharge Placement                Patient chooses bed at: [facility] Patient to be transferred to facility by: [facility] Name of family member notified: Tanner [name] Patient and family notified of of transfer: 05/05/23  Discharge Plan and Services Additional resources added to the After Visit Summary for                                       Social Determinants of Health (SDOH) Interventions SDOH Screenings   Food Insecurity: No Food Insecurity (04/25/2023)  Housing: Low Risk  (04/25/2023)  Transportation Needs: No Transportation Needs (04/25/2023)  Utilities: Not At Risk (04/25/2023)  Alcohol Screen: Low Risk  (10/27/2022)  Depression (PHQ2-9): Low Risk  (01/04/2023)  Financial Resource Strain: Low Risk  (10/27/2022)  Physical Activity: Inactive (10/27/2022)  Social Connections: Moderately Integrated (10/27/2022)  Stress: No Stress Concern Present (12/31/2022)  Recent Concern: Stress - Stress  Concern Present (10/30/2022)  Tobacco Use: Medium Risk (04/28/2023)     Readmission Risk Interventions    08/20/2022   11:10 AM  Readmission Risk Prevention Plan  Transportation Screening Complete  PCP or Specialist Appt within 3-5 Days Complete  HRI or Home Care Consult Complete  Palliative Care Screening Not Applicable  Medication Review (RN Care Manager) Complete

## 2023-05-05 NOTE — Progress Notes (Signed)
Physical Therapy Treatment Patient Details Name: Tanner [name] MRN: [id number] DOB: 1945-06-25 Today's Date: 05/05/2023   History of Present Illness The pt is a 77 yo male presenting from SNF 11/9 with AMS and acute hypoxic resp failure due to multifocal PNA. PMH includes: CHF, afib, PE on Eliquis, DM II, C. difficile, CKD 3B, morbid obesity, L TKR 2022, R TKR 12/15/2022, and admission 04/06/23 for RLE cellulitis with I&D with poly exchange on 04/07/23.    PT Comments  Pt received sitting in the recliner and agreeable to session. Pt reports feeling better today and demonstrates improved activity tolerance. Pt able to tolerate short gait trial in the room, however continues to demonstrate increased B knee and trunk flexion with increased fatigue. Pt able to perform static standing marches with focus on maintaining knee extension during stance phase with pt demonstrating improvement, however pt requires intermittent min A to carryover during short gait trial. Pt continues to benefit from PT services to progress toward functional mobility goals.    If plan is discharge home, recommend the following: Assistance with cooking/housework;Direct supervision/assist for medications management;Direct supervision/assist for financial management;Assist for transportation;Help with stairs or ramp for entrance;A little help with walking and/or transfers;A little help with bathing/dressing/bathroom   Can travel by private vehicle     No  Equipment Recommendations  None recommended by PT    Recommendations for Other Services       Precautions / Restrictions Precautions Precautions: Fall Precaution Comments: Contact precs; watch O2, hx of buckling Restrictions Weight Bearing Restrictions: No RLE Weight Bearing: Weight bearing as tolerated     Mobility  Bed Mobility               General bed mobility comments: Pt up in recliner upon arrival    Transfers Overall transfer level: Needs  assistance Equipment used: Rolling walker (2 wheels) Transfers: Sit to/from Stand Sit to Stand: Contact guard assist           General transfer comment: cues for hand placement with increased time for power up and CGA for safety    Ambulation/Gait Ambulation/Gait assistance: Contact guard assist, Min assist Gait Distance (Feet): 20 Feet (+10) Assistive device: Rolling walker (2 wheels) Gait Pattern/deviations: Step-through pattern, Decreased stride length, Trunk flexed, Knee flexed in stance - left, Knee flexed in stance - right Gait velocity: reduced   Pre-gait activities: static standing marching General Gait Details: slow gait with increased knee flexion with fatigue. Pt able to slightly improve during second short trial with tactile cues and intermittent min A      Balance Overall balance assessment: Needs assistance Sitting-balance support: No upper extremity supported, Feet supported Sitting balance-Leahy Scale: Good     Standing balance support: Bilateral upper extremity supported, Reliant on assistive device for balance, During functional activity Standing balance-Leahy Scale: Poor Standing balance comment: reliant on RW support                            Cognition Arousal: Alert Behavior During Therapy: WFL for tasks assessed/performed Overall Cognitive Status: No family/caregiver present to determine baseline cognitive functioning                                          Exercises General Exercises - Lower Extremity Hip Flexion/Marching: AROM, Both, Standing, 10 reps (x2)    General Comments  General comments (skin integrity, edema, etc.): SpO2 97% on 2L at end of session      Pertinent Vitals/Pain Pain Assessment Pain Assessment: No/denies pain     PT Goals (current goals can now be found in the care plan section) Acute Rehab PT Goals Patient Stated Goal: to feel stronger so I can go home by myself (pt lives alone) PT  Goal Formulation: With patient Time For Goal Achievement: 05/09/23 Progress towards PT goals: Progressing toward goals    Frequency    Min 1X/week       AM-PAC PT "6 Clicks" Mobility   Outcome Measure  Help needed turning from your back to your side while in a flat bed without using bedrails?: A Little Help needed moving from lying on your back to sitting on the side of a flat bed without using bedrails?: A Little Help needed moving to and from a bed to a chair (including a wheelchair)?: A Little Help needed standing up from a chair using your arms (e.g., wheelchair or bedside chair)?: A Little Help needed to walk in hospital room?: A Little Help needed climbing 3-5 steps with a railing? : Total 6 Click Score: 16    End of Session Equipment Utilized During Treatment: Gait belt;Oxygen Activity Tolerance: Patient tolerated treatment well Patient left: in chair;with call bell/phone within reach;with chair alarm set Nurse Communication: Mobility status PT Visit Diagnosis: Other abnormalities of gait and mobility (R26.89);Muscle weakness (generalized) (M62.81);Repeated falls (R29.6)     Time: 1031-1100 PT Time Calculation (min) (ACUTE ONLY): 29 min  Charges:    $Gait Training: 8-22 mins $Therapeutic Exercise: 8-22 mins PT General Charges $$ ACUTE PT VISIT: 1 Visit                     Johny Shock, PTA Acute Rehabilitation Services Secure Chat Preferred  [phone]    Johny Shock 05/05/2023, 11:12 AM

## 2023-05-05 NOTE — Progress Notes (Signed)
Speech Language Pathology Treatment: Dysphagia  Patient Details Name: Tanner [name] MRN: [id number] DOB: Dec 26, 1945 Today's Date: 05/05/2023 Time: 7829-5621 SLP Time Calculation (min) (ACUTE ONLY): 8 min  Assessment / Plan / Recommendation Clinical Impression  Pt recalled and was independent with chin tuck with liquids with sips nectar thick grape juice. Reiterated clinical reasoning for chin tuck and pt asked if that's why he used to cough when drinking and therapist stated that may have been experiencing liquids attempting to to enter trachea but unable to state with certainty. He denies coughing recently when eating or drinking. He did not exhibit s/s aspiration during observation with nectar thick liquids and trial of solid texture was timely without oral residue. Pt is scheduled to transfer to SNF today. Recommend continue regular texture, nectar thick liquids with chin tuck and pt would benefit from continued ST at SNF and outpatient MBS when appropriate per therapist for potential to upgrade liquids.     HPI HPI: 77 year old male with extensive past medical history with recent admission for AKI and polymicrobial bacteremia (MRSA, e. Faecalis) from periprosthetic right knee infection, discharged on daptomycin who presented to the ED on 11/9 with altered mental status. Per EDP note, presented with two days of cough and altered mental status. Son prsented with patient and noted he had fallen three times in the three days prior to presentation. Son also noted him falling asleep during conversation, not acting himself. CT chest with multifocal GGO consistent with multifocal pneumonia. In prior admissio pt diagnosed with oral dysphagia with Bell's Palsy with high risk of aspiration post swallow due to residue. Other PMH including CHF, HTN, HLD, paroxysmal atrial fibrillation, PE/DVT on Eliquis, diabetes, c.difficile, CKD 3, obesity      SLP Plan  Continue with current plan of care (pt being  discharged to SNF today)      Recommendations for follow up therapy are one component of a multi-disciplinary discharge planning process, led by the attending physician.  Recommendations may be updated based on patient status, additional functional criteria and insurance authorization.    Recommendations  Diet recommendations: Regular;Nectar-thick liquid Liquids provided via: Straw;Cup Medication Administration: Whole meds with liquid Supervision: Patient able to self feed;Intermittent supervision to cue for compensatory strategies Compensations: Slow rate;Small sips/bites;Chin tuck (with liquids) Postural Changes and/or Swallow Maneuvers: Seated upright 90 degrees;Chin tuck                  Oral care BID   Intermittent Supervision/Assistance Dysphagia, oropharyngeal phase (R13.12)     Continue with current plan of care (pt being discharged to SNF today)     Royce Macadamia  05/05/2023, 1:08 PM

## 2023-05-05 NOTE — Progress Notes (Signed)
Mobility Specialist Progress Note:    05/05/23 0830  Mobility  Activity Transferred from bed to chair  Level of Assistance Contact guard assist, steadying assist  Assistive Device Front wheel walker  Distance Ambulated (ft) 3 ft  Activity Response Tolerated well  Mobility Referral Yes  $Mobility charge 1 Mobility  Mobility Specialist Start Time (ACUTE ONLY) 0830  Mobility Specialist Stop Time (ACUTE ONLY) 0835  Mobility Specialist Time Calculation (min) (ACUTE ONLY) 5 min   Pt received in bed, agreeable to mobility session. Tolerated well, asx throughout. Left pt sitting comfortably in chair, all needs met, call bell in reach, alarm on.   Tanner [name] Mobility Specialist Please contact via Special educational needs teacher or  Rehab office at [phone]

## 2023-05-06 ENCOUNTER — Ambulatory Visit: Payer: Medicare HMO | Attending: Internal Medicine | Admitting: Internal Medicine

## 2023-05-06 ENCOUNTER — Other Ambulatory Visit (INDEPENDENT_AMBULATORY_CARE_PROVIDER_SITE_OTHER): Payer: Self-pay

## 2023-05-06 ENCOUNTER — Other Ambulatory Visit: Payer: Self-pay | Admitting: *Deleted

## 2023-05-06 DIAGNOSIS — Z7189 Other specified counseling: Secondary | ICD-10-CM

## 2023-05-06 LAB — FUNGUS CULTURE WITH STAIN

## 2023-05-06 LAB — FUNGAL ORGANISM REFLEX

## 2023-05-06 LAB — FUNGUS CULTURE RESULT

## 2023-05-06 NOTE — Progress Notes (Deleted)
Cardiology Office Note:    Date:  05/06/2023   ID:  Tanner [name], DOB 1946-04-25, MRN [id number]  PCP:  Administration, [facility] Providers Cardiologist:  Maisie Fus, MD     Referring MD: Myrlene Broker, *   No chief complaint on file. Paroxysmal atrial fibrillation  History of Present Illness:    Tanner [name] is a 77 y.o. male with a hx of HFpEF, type II DM, CKD stage III, DVT and PE (on prior eliquis), hypertension,  who is being seen 08/18/2022 for the evaluation of afib with RVR. He was brought via EMS with SOB. He underwent DCCV on 3/6 with 1 shock at 200J. He restored sinus rhythm. He was started on IV amiodarone which was transitioned to oral amiodarone. His home atenolol to coreg. He was mainatined on eliquis. TSH is normal. He had some decompensation and had IV lasix. He was transitioned to his home torsemide.   Had hospital admission 03/26/2022, saw Dr. Anne Fu. He was had leg edema. He was noted to have moderate MR. Managed with diuretics.   Today, blood pressures well controlled at home 120s-130s. He has felt fair since he left the hospital. He notes scale at the hospital he did not have clothes on. At home he weighed 232.3. He notes planning for knee replacement, [facility].  His sons help him with grocery shopping.  He has 5 stairs going into his house , he denies CP or SOB. No palpitations. No syncope. No orthopnea or PND. Had prior sleep study that was negative.  Wt Readings from Last 3 Encounters:  04/25/23 221 lb 5.5 oz (100.4 kg)  04/08/23 220 lb 7.4 oz (100 kg)  04/03/23 230 lb (104.3 kg)     Cardiology Studies: TTE 08/19/2022: EF 45-50%, can be challenging to assess EF with afib. Otherwise no significant valve dx. RA pressure 3 mmHg.   Past Medical History:  Diagnosis Date   Acute kidney failure (HCC)    Anxiety    Arthritis    Back pain    BPH (benign prostatic hypertrophy)    CHF (congestive heart failure)  (HCC)    Clostridium difficile infection    Clotting disorder (HCC)    Depression    Diabetes (HCC) 12/11/2016   type 2    DVT (deep venous thrombosis) (HCC)    DVT of deep femoral vein, right (HCC) 06/29/2018   Edema, lower extremity    High cholesterol    Hypertension    Joint pain    Low back pain potentially associated with spinal stenosis    Neuromuscular disorder (HCC)    Neuropathy of lower extremity    bilateral   OSA (obstructive sleep apnea)    pt denies    Osteoarthritis    PE (pulmonary embolism)    Pneumonia    hx of x 2    Ventral hernia     Past Surgical History:  Procedure Laterality Date   BRONCHIAL WASHINGS  04/29/2023   Procedure: BRONCHIAL WASHINGS;  Surgeon: Tomma Lightning, MD;  Location: [facility];  Service: Pulmonary;;   CARDIOVERSION N/A 08/19/2022   Procedure: CARDIOVERSION;  Surgeon: Meriam Sprague, MD;  Location: [facility] ENDOS[facility];  Service: Cardiovascular;  Laterality: N/A;   EYE SURGERY     HERNIA REPAIR  1999   I & D KNEE WITH POLY EXCHANGE Right 04/07/2023   Procedure: IRRIGATION AND DEBRIDEMENT KNEE WITH POLY EXCHANGE;  Surgeon: Durene Romans, MD;  Location: [facility]  OR;  Service: Orthopedics;  Laterality: Right;   TOTAL KNEE ARTHROPLASTY Left 08/26/2020   Procedure: LEFT TOTAL KNEE ARTHROPLASTY;  Surgeon: Gean Birchwood, MD;  Location: [facility] ORS;  Service: Orthopedics;  Laterality: Left;   TOTAL KNEE ARTHROPLASTY Right 12/15/2022   Procedure: TOTAL KNEE ARTHROPLASTY;  Surgeon: Durene Romans, MD;  Location: [facility] ORS;  Service: Orthopedics;  Laterality: Right;   TRANSESOPHAGEAL ECHOCARDIOGRAM (CATH LAB) N/A 04/12/2023   Procedure: TRANSESOPHAGEAL ECHOCARDIOGRAM;  Surgeon: Chrystie Nose, MD;  Location: [facility] INVASIVE CV LAB;  Service: Cardiovascular;  Laterality: N/A;   VIDEO BRONCHOSCOPY Bilateral 04/29/2023   Procedure: VIDEO BRONCHOSCOPY WITHOUT FLUORO;  Surgeon: Tomma Lightning, MD;  Location: [facility];  Service: Pulmonary;  Laterality:  Bilateral;  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     Amino Acids-Protein Hydrolys (PRO-STAT) LIQD Take 30 mLs by mouth in the morning and at bedtime.     apixaban (ELIQUIS) 5 MG TABS tablet Take 5 mg by mouth 2 (two) times daily.     buPROPion (WELLBUTRIN SR) 150 MG 12 hr tablet Take 150 mg by mouth 2 (two) times daily.     carvedilol (COREG) 6.25 MG tablet Take 1 tablet (6.25 mg total) by mouth 2 (two) times daily with a meal. 180 tablet 1   [START ON 05/20/2023] doxycycline (VIBRAMYCIN) 100 MG capsule Take 1 capsule (100 mg total) by mouth 2 (two) times daily. Start doxycycline AFTER completing the course of linezolid (Zyvox) and continue indefinitely     finasteride (PROSCAR) 5 MG tablet Take 5 mg by mouth at bedtime.     gabapentin (NEURONTIN) 300 MG capsule Take 1 capsule (300 mg total) by mouth 3 (three) times daily.     guaiFENesin (ROBITUSSIN) 100 MG/5ML liquid Take 5 mLs by mouth every 4 (four) hours as needed for cough or to loosen phlegm.     linezolid (ZYVOX) 600 MG tablet Take 1 tablet (600 mg total) by mouth every 12 (twelve) hours for 14 days. End of treatment on 05/19/2023     loratadine (CLARITIN) 10 MG tablet Take 10 mg by mouth daily.     Multiple Vitamins-Minerals (MULTIVITAMINS THER. W/MINERALS) TABS Take 1 tablet by mouth daily.     nortriptyline (PAMELOR) 10 MG capsule Take 20 mg by mouth at bedtime.     pantoprazole (PROTONIX) 40 MG tablet Take 1 tablet (40 mg total) by mouth daily at 12 noon.     polyethylene glycol (MIRALAX / GLYCOLAX) 17 g packet Take 17 g by mouth daily.     senna (SENOKOT) 8.6 MG TABS tablet Take 2 tablets (17.2 mg total) by mouth at bedtime.     No current facility-administered medications on file prior to visit.      Allergies:   Lisinopril, Amlodipine, and Codeine sulfate   Social History   Socioeconomic History   Marital status: Widowed    Spouse name: Not on file   Number of children: 2   Years of education: Not on file   Highest education level: Not on file  Occupational History   Occupation: Retired  Tobacco Use   Smoking status: Former    Current packs/day: 0.00    Average packs/day: 2.0 packs/day for 33.0 years (66.1 ttl pk-yrs)    Types: Cigarettes  Start date: 08/30/1968    Quit date: 07/02/2001    Years since quitting: 21.8   Smokeless tobacco: Never   Tobacco comments:    quit 12 years ago  Vaping Use   Vaping status: Never Used  Substance and Sexual Activity   Alcohol use: Yes    Comment: seldom    Drug use: No   Sexual activity: Not Currently  Other Topics Concern   Not on file  Social History Narrative   [employer]   [employer]-2 years   Married '66    2 sons '68, '72; 3 grandchildren   [employer] doctor.  Retired.    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    Social Determinants of Health   Financial Resource Strain: Low Risk  (10/27/2022)   Overall Financial Resource Strain (CARDIA)    Difficulty of Paying Living Expenses: Not hard at all  Food Insecurity: No Food Insecurity (04/25/2023)   Hunger Vital Sign    Worried About Running Out of Food in the Last Year: Never true    Ran Out of Food in the Last Year: Never true  Transportation Needs: No Transportation Needs (04/25/2023)   PRAPARE - Administrator, Civil Service (Medical): No    Lack of Transportation (Non-Medical): No  Physical Activity: Inactive (10/27/2022)   Exercise Vital Sign    Days of Exercise per Week: 0 days    Minutes of Exercise per Session: 0 min  Stress: No Stress Concern Present (12/31/2022)   [employer] of Occupational Health - Occupational Stress Questionnaire    Feeling of Stress : Only a little  Recent Concern: Stress - Stress Concern Present (10/30/2022)    [employer] of Occupational Health - Occupational Stress Questionnaire    Feeling of Stress : Rather much  Social Connections: Moderately Integrated (10/27/2022)   Social Connection and Isolation Panel [NHANES]    Frequency of Communication with Friends and Family: More than three times a week    Frequency of Social Gatherings with Friends and Family: Once a week    Attends Religious Services: More than 4 times per year    Active Member of [employer] or Organizations: Yes    Attends Banker Meetings: More than 4 times per year    Marital Status: Widowed     Family History: [name] family history includes Alzheimer's disease in his mother; Depression in his mother; Diabetes in his mother; Hyperlipidemia in his mother; Other (age of onset: 44) in his father; Pneumonia in his mother; Thyroid disease in his mother. There is no history of Colon cancer or Colon polyps.  ROS:   Please see the history of present illness.     All other systems reviewed and are negative.  EKGs/Labs/Other Studies Reviewed:    The following studies were reviewed today:  EKG:  EKG is  ordered today.  The ekg ordered today demonstrates   None today  Recent Labs: 04/07/2023: TSH 2.721 04/24/2023: ALT 15 04/25/2023: B Natriuretic Peptide 174.5 04/28/2023: Magnesium 1.9 05/04/2023: Hemoglobin 10.6; Platelets 219 05/05/2023: BUN 28; Creatinine, Ser 2.23; Potassium 3.8; Sodium 144  Recent Lipid Panel    Component Value Date/Time   CHOL 105 12/04/2021 1200   CHOL 109 10/12/2019 1459   TRIG 172.0 (H) 12/04/2021 1200   TRIG 187 (H) 03/30/2006 0907   HDL 30.20 (L) 12/04/2021 1200   HDL 30 (L) 10/12/2019 1459   CHOLHDL 3 12/04/2021 1200   VLDL 34.4 12/04/2021 1200  LDLCALC 41 12/04/2021 1200   LDLCALC 50 10/12/2019 1459   LDLDIRECT 65.9 09/13/2007 1028     Risk Assessment/Calculations:    CHA2DS2-VASc Score = 8   This indicates a 10.8% annual risk of stroke. [name] score is  based upon: CHF History: 1 HTN History: 1 Diabetes History: 1 Stroke History: 2 (Recurrent idiopathic pulmonary emboli/left leg thrombus) Vascular Disease History: 1 Age Score: 2 Gender Score: 0   {This patient has a significant risk of stroke if diagnosed with atrial fibrillation.  Please consider VKA or DOAC agent for anticoagulation if the bleeding risk is acceptable.   You can also use the SmartPhrase .[email or web address] for documentation.   :[id number]}    Physical Exam:    VS:   There were no vitals filed for this visit.   Wt Readings from Last 3 Encounters:  04/25/23 221 lb 5.5 oz (100.4 kg)  04/08/23 220 lb 7.4 oz (100 kg)  04/03/23 230 lb (104.3 kg)     GEN: Obese, Well nourished, well developed in no acute distress HEENT: Normal NECK: No JVD; No carotid bruits LYMPHATICS: No lymphadenopathy CARDIAC: RRR, no murmurs, rubs, gallops RESPIRATORY:  Clear to auscultation without rales, wheezing or rhonchi  ABDOMEN: Soft, non-tender, non-distended MUSCULOSKELETAL:  No edema; No deformity  SKIN: Warm and dry NEUROLOGIC:  Alert and oriented x 3 PSYCHIATRIC:  Normal affect   ASSESSMENT:    HFpEF: weight stable. No leg edema. He is euvolemic. Continue torsemide 20 mg daily. Continue jardiance 10 mg  daily  pAF: new onset in March 2024. DCCV 08/19/2022 with restoration of sinus rhythm. In NSR. Continue coreg. Continue eliquis 5 mg BID  HLD: lipitor 80 mg daily  HTN: well controlled at home. Continue coreg 6.25 mg BID  Pre-Op: Planned for knee replacement. No active chest pain.He can do >4 METS without significant symptoms. RCRI Class II; 6.0% risk of an adverse cardiac event. He is acceptable cardiac risk for knee replacement PLAN:    In order of problems listed above:  Hepatic function test, TSH Follow up 6 months       Medication Adjustments/Labs and Tests Ordered: Current medicines are reviewed at length with the patient today.  Concerns regarding medicines are  outlined above.  No orders of the defined types were placed in this encounter.  No orders of the defined types were placed in this encounter.   There are no Patient Instructions on file for this visit.   Signed, Maisie Fus, MD  05/06/2023 1:27 PM    [facility]

## 2023-05-06 NOTE — Patient Outreach (Signed)
Mr. Dionisios readmitted back to [facility]. He was active with chronic care management team prior.   Facility site visit to [facility] today. Went to bedside to speak with Mr. Stude. However he was off the unit in the dining room.   Spoke with Deseree, [employer] social worker, to make aware writer is following for transition plans/needs.   Will continue to follow and keep CCM updated.   Raiford Noble, MSN, RN, BSN [place]  [facility] RN Post- Acute Care Manager Direct Dial: [phone]

## 2023-05-06 NOTE — Nursing Note (Signed)
POPULATION HEALTH    DISEASE MANAGEMENT COORDINATOR    [facility] made first call attempt to patient. Patient did not answer [facility] left voicemail with call back information. [facility] will call patient again within 48 hours.         Alden Benjamin, RN, BSN  Disease Management Coordinator  [employer]  [phone]

## 2023-05-07 ENCOUNTER — Other Ambulatory Visit (INDEPENDENT_AMBULATORY_CARE_PROVIDER_SITE_OTHER): Payer: Self-pay

## 2023-05-07 ENCOUNTER — Ambulatory Visit (HOSPITAL_COMMUNITY): Payer: Self-pay

## 2023-05-07 DIAGNOSIS — E1121 Type 2 diabetes mellitus with diabetic nephropathy: Secondary | ICD-10-CM | POA: Diagnosis not present

## 2023-05-07 DIAGNOSIS — E785 Hyperlipidemia, unspecified: Secondary | ICD-10-CM | POA: Diagnosis not present

## 2023-05-07 DIAGNOSIS — L89323 Pressure ulcer of left buttock, stage 3: Secondary | ICD-10-CM | POA: Diagnosis not present

## 2023-05-07 DIAGNOSIS — F329 Major depressive disorder, single episode, unspecified: Secondary | ICD-10-CM | POA: Diagnosis not present

## 2023-05-07 DIAGNOSIS — Z7189 Other specified counseling: Secondary | ICD-10-CM

## 2023-05-07 DIAGNOSIS — F338 Other recurrent depressive disorders: Secondary | ICD-10-CM

## 2023-05-07 NOTE — Nursing Note (Signed)
POPULATION HEALTH  Behavioral Health Integration-Follow Up       BHI Enrollment Date Patient was enrolled in BHI on 04/14/2023  9:51 AM.    PCP Rhoderick Moody, DO   Behavioral Health Case Manager Tasia Catchings, MSW  Collaborating Psychiatrist Doristine Bosworth, MD    ASSESSMENT/ PLAN:    MSW spoke with patient via telephone this date for general follow up. Dr. Hyacinth Meeker has placed recommendations in his note on 04/22/23. Patient is hesitant to consider medications for seasonal depressive symptoms or anxiety, but recommendations will remain in the event there is a need. MSW discussed light therapy with patient, he was interested, MSW sent some information via MyChart for him to explore further.   Patient denies any needs currently. He had some questions about recent lab work, he intended to contact [facility] back, MSW did provide her direct line to patient.   Patient reports he has been working on projects and enjoys that. Today it is snowing, he reports he likes "seeing the white".   MSW will follow and encouraged ongoing communication of needs or support.   Brief Intervention: Emotional Support  Mindfulness  Other Brief Intervention  Follow with regular PHQ-9    SUBJECTIVE:    Current Behavioral Health History:  Behavioral Health Care Management - Encounter Level:   Recent Suicidal Thoughts?: NONE  Recent Homicidal Thoughts?: denies homicidal ideation  Psychiatric Medication Assessment:  Medication(s) seem effective?: too soon to tell  Brief Interventions Provided this Encounter: Emotional Support, Mindfulness, Other Brief Intervention  Safety risk related to mental health?: Neg       Past Behavioral Health History:  Behavioral Health Care Management - Patient Level:          OBJECTIVE:  Validated Measures:       04/14/2023    10:00 AM   Most Recent PHQ-9 Scores   PHQ 9 Total 5       Little interest or pleasure in doing things.: 0  Feeling down, depressed, or hopeless: 1  PHQ 2 Total: 1  Trouble falling or staying  asleep, or sleeping too much.: 0  Feeling tired or having little energy: 1  Poor appetite or overeating: 2  Feeling bad about yourself/ that you are a failure in the past 2 weeks?: 1  Trouble concentrating on things in the past 2 weeks?: 0  Moving/Speaking slowly or being fidgety or restless  in the past 2 weeks?: 0  Thoughts that you would be better off DEAD, or of hurting yourself in some way.: 0  If you checked off any problems, how difficult have these problems made it for you to do your work, take care of things at home, or get along with other people?: Not difficult at all  PHQ 9 Total: 5      Care Management Tasks Completed:  Chart review completed.  Social determinants of health reviewed and updated as needed.  Care plan reviewed/discussed/updated.  Interventions ongoing and updated as needed.  Goals addressed and updated.  Interventions ongoing and updated as needed.  Education completed.  Self management plan reviewed and updated.  See patient care coordination note.  Barriers to health, care plan, and goals reviewed.  Interventions ongoing and updated as needed.  Health maintenance reviewed and discussed with patient.  Health maintenance updated as applicable.  Discussed upcoming appointment dates and times.  Reinforced importance of keeping all provider appointments.  Reinforced the importance of taking all medications as prescribed.  Medical history, surgical history, hospitalizations,  and medications reviewed and updated as applicable.    Ongoing and Updated Assessment Interventions:  PHQ9    Case Manager To Do List for the Next Interaction:  Routine follow up  Follow up on light therapy    Plan to call patient in ~ one month to reassess and update plan of care.  Instructed patient to call with change in symptoms or as needed prior to next follow up.      Tasia Catchings, MSW  05/07/2023, 11:08  Behavioral Health Case Manager

## 2023-05-07 NOTE — Nursing Note (Signed)
POPULATION HEALTH    COMPLEX CARE MANAGEMENT    Chronic Care Management - CCM  Status: Enrolled  Effective Dates: 12/02/2022 - present  Responsible Staff: Alden Benjamin, RN    Behavioral Health Integration - BHI  Status: Enrolled  Effective Dates: 04/14/2023 - present  Responsible Staff: Tasia Catchings, MSW          Patient reports to be doing well . Patient spoke to [facility] Specialist today. Things are going well with the program . Patient has checked BP recently last BP was 109/64. Patient denies any SOB. He saw Pulmonary after spirometry and PFTs stated patient had mild increase in airway disease. Patient denies CP at this time. He did has a bout of pain but he took tylenol and it went away he didn't feel it was cardiac related.  Patient had blood work all was within normal limits except platelets were 144. [facility] notified patient he is just a little under the normal range starts at 150. [facility] notified patient that PCP made a note to address with patient at next appointment on 12/23. Patient verbalized understanding. Patient has no acute needs at this time. [facility] continues to follow patient.     Care Management Tasks Completed:  Chart review completed.  Social determinants of health reviewed and updated as needed.  Care plan reviewed/discussed/updated.  Interventions ongoing and updated as needed.  Goals addressed and updated.  Interventions ongoing and updated as needed.  Education completed.  Self management plan reviewed and updated.  See patient care coordination note.  Barriers to health, care plan, and goals reviewed.  Interventions ongoing and updated as needed.  Health maintenance/care gaps reviewed and discussed with patient.  Health maintenance/care gaps updated as applicable.  Discussed upcoming appointment dates and times.  Reinforced importance of keeping all provider appointments.  Reinforced the importance of taking all medications as prescribed.  Medical history, surgical history, hospitalizations,  and medications reviewed and updated as applicable.    Ongoing and Updated Assessment Interventions:  Encouraged BP checks   Discussed proper technique of taking BP  Discussed BHI Program  Reviewed Pulmonary Care   Confirmed PCP appointment   Discussed General Health     Case Manager To Do List for the Next Interaction:  Monthly follow up  F/U BHI   F/U BP   F/U PCP appointment  12/23  F/U blood work platelets 144    Plan to call patient in ~ one month to reassess and update plan of care.  Instructed patient to call with change in symptoms or as needed prior to next follow up.      Alden Benjamin, RN

## 2023-05-10 DIAGNOSIS — I5022 Chronic systolic (congestive) heart failure: Secondary | ICD-10-CM | POA: Diagnosis not present

## 2023-05-10 DIAGNOSIS — R1312 Dysphagia, oropharyngeal phase: Secondary | ICD-10-CM | POA: Diagnosis not present

## 2023-05-10 DIAGNOSIS — I480 Paroxysmal atrial fibrillation: Secondary | ICD-10-CM | POA: Diagnosis not present

## 2023-05-10 DIAGNOSIS — N1832 Chronic kidney disease, stage 3b: Secondary | ICD-10-CM | POA: Diagnosis not present

## 2023-05-10 DIAGNOSIS — F324 Major depressive disorder, single episode, in partial remission: Secondary | ICD-10-CM | POA: Diagnosis not present

## 2023-05-10 DIAGNOSIS — E785 Hyperlipidemia, unspecified: Secondary | ICD-10-CM | POA: Diagnosis not present

## 2023-05-10 DIAGNOSIS — I10 Essential (primary) hypertension: Secondary | ICD-10-CM | POA: Diagnosis not present

## 2023-05-10 DIAGNOSIS — J9601 Acute respiratory failure with hypoxia: Secondary | ICD-10-CM | POA: Diagnosis not present

## 2023-05-10 DIAGNOSIS — W19XXXA Unspecified fall, initial encounter: Secondary | ICD-10-CM

## 2023-05-10 DIAGNOSIS — E1121 Type 2 diabetes mellitus with diabetic nephropathy: Secondary | ICD-10-CM | POA: Diagnosis not present

## 2023-05-10 DIAGNOSIS — F329 Major depressive disorder, single episode, unspecified: Secondary | ICD-10-CM | POA: Diagnosis not present

## 2023-05-12 ENCOUNTER — Inpatient Hospital Stay: Payer: Self-pay | Admitting: Infectious Diseases

## 2023-05-12 ENCOUNTER — Telehealth: Payer: Self-pay

## 2023-05-12 ENCOUNTER — Encounter: Payer: Self-pay | Admitting: Infectious Diseases

## 2023-05-12 ENCOUNTER — Other Ambulatory Visit (INDEPENDENT_AMBULATORY_CARE_PROVIDER_SITE_OTHER): Payer: Self-pay

## 2023-05-12 DIAGNOSIS — F338 Other recurrent depressive disorders: Secondary | ICD-10-CM

## 2023-05-12 DIAGNOSIS — E782 Mixed hyperlipidemia: Secondary | ICD-10-CM

## 2023-05-12 DIAGNOSIS — I10 Essential (primary) hypertension: Secondary | ICD-10-CM

## 2023-05-12 DIAGNOSIS — Z7189 Other specified counseling: Secondary | ICD-10-CM

## 2023-05-12 NOTE — Telephone Encounter (Signed)
After speaking to nurse at [facility]  was transferred to transportation office and left vm with Deseree stating patient missed appt that may not have been discussed upon [facility] discharge but needs to be seen next week. Gave call back number and requested lab results and med list be sent with patient to visit next week or first available per Dr Elinor Parkinson.

## 2023-05-12 NOTE — Progress Notes (Signed)
Dr. Rhoderick Moody, DO  ,    This patient has met the requirements to bill for COMPLEX Chronic Care Management this month.       Please add .[email or web address] to this note for attestation and billing.      Chronic Care Management Time Documentation on 05/12/2023 10:41.   Time spent during current encounter is 1 minutes.   Cumulative time during current month's episode (month-to-date) is 55 minutes.          ICD-10-CM    1. Encounter for counseling for care management of patient with chronic conditions and complex health needs using nurse-based model  Z71.89       2. Seasonal depression (CMS HCC)  F33.8       3. Essential hypertension  I10       4. Mixed hyperlipidemia  E78.2             acetaminophen (TYLENOL) 500 mg Oral Tablet, Take 1 Tablet (500 mg total) by mouth Every 4 hours as needed for Pain (pt takes tylenol pm)  alfuzosin (UROXATRAL) 10 mg Oral Tablet Sustained Release 24 hr, TAKE ONE TABLET BY MOUTH EVERY DAY  apixaban (ELIQUIS) 5 mg Oral Tablet, Take 1 Tablet (5 mg total) by mouth Twice daily SHIP TO PATIENT,   [id number]  STATE LICENSE- [id number]  NPI- [id number]  diltiazem HCl (TIAZAC) 240 mg Oral Capsule,Sustained Action 24 hr, TAKE ONE CAPSULE BY MOUTH DAILY  finasteride (PROSCAR) 5 mg Oral Tablet, TAKE 1 TABLET EVERY DAY  flecainide (TAMBOCOR) 100 mg Oral Tablet, TAKE ONE TABLET BY MOUTH TWICE DAILY  furosemide (LASIX) 40 mg Oral Tablet, TAKE ONE TABLET BY MOUTH EVERY DAY  losartan (COZAAR) 50 mg Oral Tablet, Take 1 Tablet (50 mg total) by mouth Once a day  nitroGLYCERIN (NITROSTAT) 0.4 mg Sublingual Tablet, Sublingual, Place 1 Tablet (0.4 mg total) under the tongue Every 5 minutes as needed for Chest pain  potassium chloride (KLOR-CON) 10 mEq Oral Tablet Sustained Release, TAKE ONE TABLET BY MOUTH EVERY DAY  rosuvastatin (CRESTOR) 10 mg Oral Tablet, Take 1 Tablet (10 mg total) by mouth Every evening  SPIRIVA WITH HANDIHALER 18 mcg Inhalation Capsule, w/Inhalation Device, Take 1 Capsule (18 mcg total)  by inhalation Once a day    No facility-administered medications prior to visit.      Thanks,  Your Chronic Care Management Team

## 2023-05-14 ENCOUNTER — Ambulatory Visit (INDEPENDENT_AMBULATORY_CARE_PROVIDER_SITE_OTHER): Payer: Self-pay | Admitting: Family Medicine

## 2023-05-14 DIAGNOSIS — F338 Other recurrent depressive disorders: Secondary | ICD-10-CM

## 2023-05-14 NOTE — Progress Notes (Signed)
Behavioral Health Integration --Last encounter this month  Behavioral Health Integration - BHI  Status: Enrolled  Effective Dates: 04/14/2023 - present  Responsible Staff: Tasia Catchings, MSW      BHI Care Team:   Primary Care Provider Rhoderick Moody, DO  Behavioral Health Case Manager Tasia Catchings, MSW  Collaborating Psychiatrist Doristine Bosworth, MD    Behavioral Health Integration Psychiatric Collaborative Care services have been performed by behavioral health case manager during this calendar month under the supervision of primary care provider Rhoderick Moody, DO for behavioral health condition(s):  (F33.8) Seasonal depression (CMS [facility])  (primary encounter diagnosis)      The patient hasbeen added to a patient roster for weekly case load review with collaborating psychiatrist.  [name] treatment plan and status has been reviewed in weekly case conference with collaborating psychiatrist.    Brief interventions provided by case manager during this calendar month: emotional support, mindfulness, and mood regulation    Validated Measures used to monitor patient progress during this calendar month:      04/14/2023    10:00 AM   Most Recent PHQ-9 Scores   PHQ 9 Total 5           04/14/2023    10:00 AM   Most Recent GAD-7 Scores   Gad-7 Score Total 4           Total BHI time spent this month is 21 minutes.     Tasia Catchings, MSW  05/14/2023, 11:40

## 2023-05-16 DIAGNOSIS — E785 Hyperlipidemia, unspecified: Secondary | ICD-10-CM | POA: Diagnosis not present

## 2023-05-16 DIAGNOSIS — R1312 Dysphagia, oropharyngeal phase: Secondary | ICD-10-CM | POA: Diagnosis not present

## 2023-05-16 DIAGNOSIS — I10 Essential (primary) hypertension: Secondary | ICD-10-CM | POA: Diagnosis not present

## 2023-05-16 DIAGNOSIS — N183 Chronic kidney disease, stage 3 unspecified: Secondary | ICD-10-CM | POA: Diagnosis not present

## 2023-05-16 DIAGNOSIS — N1832 Chronic kidney disease, stage 3b: Secondary | ICD-10-CM | POA: Diagnosis not present

## 2023-05-16 DIAGNOSIS — K219 Gastro-esophageal reflux disease without esophagitis: Secondary | ICD-10-CM | POA: Diagnosis not present

## 2023-05-16 DIAGNOSIS — I480 Paroxysmal atrial fibrillation: Secondary | ICD-10-CM | POA: Diagnosis not present

## 2023-05-16 DIAGNOSIS — R41841 Cognitive communication deficit: Secondary | ICD-10-CM | POA: Diagnosis not present

## 2023-05-16 DIAGNOSIS — F329 Major depressive disorder, single episode, unspecified: Secondary | ICD-10-CM | POA: Diagnosis not present

## 2023-05-16 DIAGNOSIS — T8453XD Infection and inflammatory reaction due to internal right knee prosthesis, subsequent encounter: Secondary | ICD-10-CM | POA: Diagnosis not present

## 2023-05-16 DIAGNOSIS — R471 Dysarthria and anarthria: Secondary | ICD-10-CM | POA: Diagnosis not present

## 2023-05-16 DIAGNOSIS — R2681 Unsteadiness on feet: Secondary | ICD-10-CM | POA: Diagnosis not present

## 2023-05-16 DIAGNOSIS — J690 Pneumonitis due to inhalation of food and vomit: Secondary | ICD-10-CM | POA: Diagnosis not present

## 2023-05-16 DIAGNOSIS — F324 Major depressive disorder, single episode, in partial remission: Secondary | ICD-10-CM | POA: Diagnosis not present

## 2023-05-16 DIAGNOSIS — I5022 Chronic systolic (congestive) heart failure: Secondary | ICD-10-CM | POA: Diagnosis not present

## 2023-05-16 DIAGNOSIS — M6281 Muscle weakness (generalized): Secondary | ICD-10-CM | POA: Diagnosis not present

## 2023-05-16 DIAGNOSIS — J9601 Acute respiratory failure with hypoxia: Secondary | ICD-10-CM | POA: Diagnosis not present

## 2023-05-16 DIAGNOSIS — E1121 Type 2 diabetes mellitus with diabetic nephropathy: Secondary | ICD-10-CM | POA: Diagnosis not present

## 2023-05-16 DIAGNOSIS — R278 Other lack of coordination: Secondary | ICD-10-CM | POA: Diagnosis not present

## 2023-05-16 DIAGNOSIS — L97811 Non-pressure chronic ulcer of other part of right lower leg limited to breakdown of skin: Secondary | ICD-10-CM | POA: Diagnosis not present

## 2023-05-18 ENCOUNTER — Ambulatory Visit (HOSPITAL_COMMUNITY): Payer: Self-pay

## 2023-05-18 ENCOUNTER — Other Ambulatory Visit: Payer: Self-pay | Admitting: *Deleted

## 2023-05-18 DIAGNOSIS — F338 Other recurrent depressive disorders: Secondary | ICD-10-CM

## 2023-05-18 NOTE — Patient Outreach (Signed)
Late entry for 05/17/23. [name] resides in [facility]. Active with complex care management team prior.   Telephonic meeting with D[place], SNF social worker. Anticipated transition plan is to return home alone. Son Luisa Hart is primary contact.   Will continue to follow and plan outreach to son.  Tanner Noble, MSN, Tanner [name], Tanner [name] [place]  [facility], [name] Post- Acute Care Manager Direct Dial: [phone]

## 2023-05-18 NOTE — Nursing Note (Signed)
[facility]  Behavioral Health Integration --Psychiatric Case Review   Behavioral Health Integration - BHI  Status: Enrolled  Effective Dates: 04/14/2023 - present  Responsible Staff: Tasia Catchings, MSW      Behavioral Health Integration Care Team:  [id number] [place], DO  Collaborating Psychiatrist: Doristine Bosworth, MD    ASSESSMENT/ PLAN: (Actions for PCP to Take)     MSW provided update to Dr. Hyacinth Meeker this date, patient remains not interest in medication management at this time, but was open to education on light therapy, and MSW provided additional resources for light therapy.   Recommendations for Case Manager (additional assessments or interventions  or linkages/referrals to consider): Psychoeducation, emotional support, PHQ9.   Recommendations for PCP regarding pharmacologic management:  See Dr. Hyacinth Meeker note for details. We recommend changes (if appropriate) be made within 5 business days for continuation of care for patient. No change in recommendations   Total BHI time spent this encounter is 5 minutes.   Total BHI time spent this month is 5 minutes.      SUBJECTIVE/ OBJECTIVE:  Thomas [name]'s case was reviewed by collaborating psychiatrist and BH case manager today in a telephone-video conference call.  The collaborating psychiatrist was provided a summary of the patient's behavioral health by the case manager including results of validated measures compared to baseline.  The psychiatrist reviewed health history and case manager notes in the medical record.    Summary of Past Behavioral Health History:  Behavioral Health Care Management - Patient Level:          PHQ 9 Follow Up Questionnaire            04/14/2023    10:00 AM   Most Recent PHQ-9 Scores   PHQ 9 Total 5           04/14/2023    10:00 AM   Most Recent PHQ-9 Scores   PHQ 9 Total 5     Little interest or pleasure in doing things.: 0  Feeling down, depressed, or hopeless: 1  PHQ 2 Total: 1  Trouble falling or staying asleep, or sleeping too much.:  0  Feeling tired or having little energy: 1  Poor appetite or overeating: 2  Feeling bad about yourself/ that you are a failure in the past 2 weeks?: 1  Trouble concentrating on things in the past 2 weeks?: 0  Moving/Speaking slowly or being fidgety or restless  in the past 2 weeks?: 0  Thoughts that you would be better off DEAD, or of hurting yourself in some way.: 0  If you checked off any problems, how difficult have these problems made it for you to do your work, take care of things at home, or get along with other people?: Not difficult at all  PHQ 9 Total: 5    Last Gad-7 Scale:            04/14/2023    10:00 AM   Most Recent GAD-7 Scores   Gad-7 Score Total 4       Tasia Catchings, MSW  05/18/2023, 10:44   I have read and reviewed the social worker's note. I agree with the current plan of care.    Doristine Bosworth, MD

## 2023-05-19 DIAGNOSIS — I10 Essential (primary) hypertension: Secondary | ICD-10-CM | POA: Diagnosis not present

## 2023-05-19 DIAGNOSIS — K219 Gastro-esophageal reflux disease without esophagitis: Secondary | ICD-10-CM | POA: Diagnosis not present

## 2023-05-19 DIAGNOSIS — E1121 Type 2 diabetes mellitus with diabetic nephropathy: Secondary | ICD-10-CM | POA: Diagnosis not present

## 2023-05-19 DIAGNOSIS — F329 Major depressive disorder, single episode, unspecified: Secondary | ICD-10-CM | POA: Diagnosis not present

## 2023-05-19 DIAGNOSIS — R1312 Dysphagia, oropharyngeal phase: Secondary | ICD-10-CM | POA: Diagnosis not present

## 2023-05-19 DIAGNOSIS — N1832 Chronic kidney disease, stage 3b: Secondary | ICD-10-CM | POA: Diagnosis not present

## 2023-05-19 DIAGNOSIS — J9601 Acute respiratory failure with hypoxia: Secondary | ICD-10-CM | POA: Diagnosis not present

## 2023-05-19 DIAGNOSIS — E785 Hyperlipidemia, unspecified: Secondary | ICD-10-CM | POA: Diagnosis not present

## 2023-05-19 DIAGNOSIS — I5022 Chronic systolic (congestive) heart failure: Secondary | ICD-10-CM | POA: Diagnosis not present

## 2023-05-19 DIAGNOSIS — I480 Paroxysmal atrial fibrillation: Secondary | ICD-10-CM | POA: Diagnosis not present

## 2023-05-19 DIAGNOSIS — F324 Major depressive disorder, single episode, in partial remission: Secondary | ICD-10-CM | POA: Diagnosis not present

## 2023-05-24 ENCOUNTER — Ambulatory Visit (HOSPITAL_COMMUNITY): Payer: Self-pay

## 2023-05-24 DIAGNOSIS — N1832 Chronic kidney disease, stage 3b: Secondary | ICD-10-CM | POA: Diagnosis not present

## 2023-05-24 DIAGNOSIS — I5022 Chronic systolic (congestive) heart failure: Secondary | ICD-10-CM | POA: Diagnosis not present

## 2023-05-24 DIAGNOSIS — F324 Major depressive disorder, single episode, in partial remission: Secondary | ICD-10-CM | POA: Diagnosis not present

## 2023-05-24 DIAGNOSIS — J9601 Acute respiratory failure with hypoxia: Secondary | ICD-10-CM | POA: Diagnosis not present

## 2023-05-24 DIAGNOSIS — R1312 Dysphagia, oropharyngeal phase: Secondary | ICD-10-CM | POA: Diagnosis not present

## 2023-05-24 DIAGNOSIS — I480 Paroxysmal atrial fibrillation: Secondary | ICD-10-CM | POA: Diagnosis not present

## 2023-05-24 DIAGNOSIS — K219 Gastro-esophageal reflux disease without esophagitis: Secondary | ICD-10-CM | POA: Diagnosis not present

## 2023-05-24 DIAGNOSIS — I10 Essential (primary) hypertension: Secondary | ICD-10-CM | POA: Diagnosis not present

## 2023-05-24 DIAGNOSIS — E1121 Type 2 diabetes mellitus with diabetic nephropathy: Secondary | ICD-10-CM | POA: Diagnosis not present

## 2023-05-24 DIAGNOSIS — F338 Other recurrent depressive disorders: Secondary | ICD-10-CM

## 2023-05-24 NOTE — Nursing Note (Signed)
POPULATION HEALTH  Behavioral Health Integration - Psychiatric Collaborative Care - Follow Up    BHI Enrollment Date Patient was enrolled in BHI on 04/14/2023  9:51 AM.    PCP Rhoderick Moody, DO   Behavioral Health Case Manager Tasia Catchings, MSW  Collaborating Psychiatrist Doristine Bosworth, MD     ASSESSMENT/ PLAN:     MSW spoke with patient via telephone this date. He reports he has been feeling fluctuation in mood, MSW explored that with him. He reports he notices feeling "down". Patient reports he feels he has not seen or socialized as he would like to and that impacts his mood. Patient daughter is coming today to visit and he is looking forward to that. He identified he will speak with her about the light therapy recommendations.   Patient also talked with MSW about consideration of "checking out the senior center this week" to which MSW supported.   Patient declined interest in caring for animal companion.   MSW will follow.  Phq9 completed  Patient denies any thoughts of self harm.      Brief Intervention(s):   Emotional Support  Communication Skills  Follow with regular PHQ-9     Patient Goals:    Goals Addressed                   This Visit's Progress     Anxiety Symptoms Monitored and Managed   Not on track     Evidence-based guidance:   Explore treatment options in a youth-friendly atmosphere of hope and optimism [e.g., cognitive behavioral therapy, pharmacologic therapy, adjunct therapy (applied relaxation, mindfulness, meditation)].   Encourage ?obooster? sessions of cognitive behavioral therapy to prevent relapse that may occur 6 to 24 months after treatment has concluded.   Prepare child and caregiver for pharmacologic therapy (e.g., benzodiazepine) as short-term treatment to alleviate severe symptoms while allotting time for anxiolytic medication to become therapeutic, as well as allow for engagement    in treatment.   Prepare patient for short-term pharmacologic therapy when symptoms become  debilitating (e.g., selective serotonin-reuptake inhibitor, serotonin-norepinephrine-reuptake inhibitor, tricyclic antidepressant).   Anticipate referral to behavioral health specialist or psychiatry when first-line treatments are not effective, anxiety is complicated by alcohol or substance use, symptoms interfere with social functioning, secondary depression or    suicidality occurs.   Consider parent-based training when child participation in cognitive behavioral therapy is not possible or child is not responding to treatment.   Address barriers to treatment that may include long wait times, delays in scheduling appointments, lack of pediatric mental health providers, transportation, inadequate or no insurance or stigma associated with mental health    disorders and treatment.    Notes:                 SUBJECTIVE:    Current Behavioral Health History:  Behavioral Health Care Management - Encounter Level:   Current Symptoms: depressed mood  Recent Suicidal Thoughts?: NONE  Recent Homicidal Thoughts?: denies homicidal ideation  Psychiatric Medication Assessment:  Medication(s) seem effective?: too soon to tell  Medication Adverse Effects: No  Brief Interventions Provided this Encounter: Emotional Support, Communication Skills  Safety risk related to mental health?: Neg          OBJECTIVE:  Validated Measures:       04/14/2023    10:00 AM 05/24/2023    11:00 AM   Most Recent PHQ-9 Scores   PHQ 9 Total 5 5       Little interest  or pleasure in doing things.: 1  Feeling down, depressed, or hopeless: 1  PHQ 2 Total: 2  Trouble falling or staying asleep, or sleeping too much.: 0  Feeling tired or having little energy: 0  Poor appetite or overeating: 2  Feeling bad about yourself/ that you are a failure in the past 2 weeks?: 0  Trouble concentrating on things in the past 2 weeks?: 0  Moving/Speaking slowly or being fidgety or restless  in the past 2 weeks?: 1  Thoughts that you would be better off DEAD, or of hurting  yourself in some way.: 0  If you checked off any problems, how difficult have these problems made it for you to do your work, take care of things at home, or get along with other people?: Not difficult at all  PHQ 9 Total: 5          Ongoing and Updated Assessment Interventions:  PHQ9     Case Manager To Do List for the Next Interaction:   Routine follow up  Follow up on light therapy        Plan to call patient in ~ one month to reassess and update plan of care.  Instructed patient to call with change in symptoms or as needed prior to next follow up.       Tasia Catchings, MSW  05/24/2023, 11:44  Behavioral Health Case Manager

## 2023-05-26 DIAGNOSIS — F329 Major depressive disorder, single episode, unspecified: Secondary | ICD-10-CM | POA: Diagnosis not present

## 2023-05-26 DIAGNOSIS — L97811 Non-pressure chronic ulcer of other part of right lower leg limited to breakdown of skin: Secondary | ICD-10-CM | POA: Diagnosis not present

## 2023-05-26 DIAGNOSIS — E785 Hyperlipidemia, unspecified: Secondary | ICD-10-CM | POA: Diagnosis not present

## 2023-05-26 DIAGNOSIS — E1121 Type 2 diabetes mellitus with diabetic nephropathy: Secondary | ICD-10-CM | POA: Diagnosis not present

## 2023-05-31 DIAGNOSIS — L97811 Non-pressure chronic ulcer of other part of right lower leg limited to breakdown of skin: Secondary | ICD-10-CM | POA: Diagnosis not present

## 2023-05-31 DIAGNOSIS — F324 Major depressive disorder, single episode, in partial remission: Secondary | ICD-10-CM | POA: Diagnosis not present

## 2023-05-31 DIAGNOSIS — K219 Gastro-esophageal reflux disease without esophagitis: Secondary | ICD-10-CM | POA: Diagnosis not present

## 2023-05-31 DIAGNOSIS — N1832 Chronic kidney disease, stage 3b: Secondary | ICD-10-CM | POA: Diagnosis not present

## 2023-05-31 DIAGNOSIS — J9601 Acute respiratory failure with hypoxia: Secondary | ICD-10-CM | POA: Diagnosis not present

## 2023-05-31 DIAGNOSIS — F329 Major depressive disorder, single episode, unspecified: Secondary | ICD-10-CM | POA: Diagnosis not present

## 2023-05-31 DIAGNOSIS — I5022 Chronic systolic (congestive) heart failure: Secondary | ICD-10-CM | POA: Diagnosis not present

## 2023-05-31 DIAGNOSIS — I10 Essential (primary) hypertension: Secondary | ICD-10-CM | POA: Diagnosis not present

## 2023-05-31 DIAGNOSIS — E1121 Type 2 diabetes mellitus with diabetic nephropathy: Secondary | ICD-10-CM | POA: Diagnosis not present

## 2023-05-31 DIAGNOSIS — R1312 Dysphagia, oropharyngeal phase: Secondary | ICD-10-CM | POA: Diagnosis not present

## 2023-05-31 DIAGNOSIS — I480 Paroxysmal atrial fibrillation: Secondary | ICD-10-CM | POA: Diagnosis not present

## 2023-05-31 DIAGNOSIS — E785 Hyperlipidemia, unspecified: Secondary | ICD-10-CM | POA: Diagnosis not present

## 2023-06-01 DIAGNOSIS — I10 Essential (primary) hypertension: Secondary | ICD-10-CM | POA: Diagnosis not present

## 2023-06-01 DIAGNOSIS — J9601 Acute respiratory failure with hypoxia: Secondary | ICD-10-CM | POA: Diagnosis not present

## 2023-06-01 DIAGNOSIS — E1121 Type 2 diabetes mellitus with diabetic nephropathy: Secondary | ICD-10-CM | POA: Diagnosis not present

## 2023-06-01 DIAGNOSIS — I480 Paroxysmal atrial fibrillation: Secondary | ICD-10-CM | POA: Diagnosis not present

## 2023-06-01 DIAGNOSIS — W19XXXA Unspecified fall, initial encounter: Secondary | ICD-10-CM

## 2023-06-01 DIAGNOSIS — F324 Major depressive disorder, single episode, in partial remission: Secondary | ICD-10-CM | POA: Diagnosis not present

## 2023-06-01 DIAGNOSIS — R1312 Dysphagia, oropharyngeal phase: Secondary | ICD-10-CM | POA: Diagnosis not present

## 2023-06-01 DIAGNOSIS — I5022 Chronic systolic (congestive) heart failure: Secondary | ICD-10-CM | POA: Diagnosis not present

## 2023-06-01 DIAGNOSIS — K219 Gastro-esophageal reflux disease without esophagitis: Secondary | ICD-10-CM | POA: Diagnosis not present

## 2023-06-01 DIAGNOSIS — N1832 Chronic kidney disease, stage 3b: Secondary | ICD-10-CM | POA: Diagnosis not present

## 2023-06-02 ENCOUNTER — Inpatient Hospital Stay: Payer: Medicare HMO | Attending: Internal Medicine

## 2023-06-02 ENCOUNTER — Inpatient Hospital Stay: Payer: Medicare HMO | Admitting: Hematology & Oncology

## 2023-06-02 ENCOUNTER — Ambulatory Visit: Payer: Self-pay | Admitting: Urology

## 2023-06-03 ENCOUNTER — Other Ambulatory Visit (INDEPENDENT_AMBULATORY_CARE_PROVIDER_SITE_OTHER): Payer: Self-pay

## 2023-06-03 DIAGNOSIS — Z7189 Other specified counseling: Secondary | ICD-10-CM

## 2023-06-03 LAB — FUNGUS CULTURE WITH STAIN

## 2023-06-03 LAB — FUNGAL ORGANISM REFLEX

## 2023-06-03 LAB — FUNGUS CULTURE RESULT

## 2023-06-03 NOTE — Nursing Note (Signed)
POPULATION HEALTH    DISEASE MANAGEMENT COORDINATOR  [facility] called patient for monthly follow up , patient did not answer. DMC left voicemail with contact information. Dmc will make a second call attempt within 48 business hours.         Alden Benjamin, RN, BSN  Disease Management Coordinator  [employer]  [phone]

## 2023-06-03 NOTE — Nursing Note (Signed)
POPULATION HEALTH    COMPLEX CARE MANAGEMENT    Chronic Care Management - CCM  Status: Enrolled  Effective Dates: 12/02/2022 - present  Responsible Staff: Alden Benjamin, RN    Behavioral Health Integration - BHI  Status: Enrolled  Effective Dates: 04/14/2023 - present  Responsible Staff: Tasia Catchings, MSW          Patient reports to be doing okay. He states he has been having a cough throat pain x a few months asked if losartan caused that as a side effect, DMC noted that she was not aware of that being a side effect but to ask PCP during visit on 12/23 to address. DMC offered to make appointment sooner patient declined. Patient also stated he has been getting chest pain only on his left side while laying down, denies chest pain any other other time, denies indigestion x 2 weeks. Patient also states SOB on and off, breathes better when he is outside, patient takes inhaler but does not assist in relief. DMC educated patient on Cardiac Health . DMC did suggest patient go to ED and that she can let PCP know . DMC suggested that patient could get ECG and blood work  for cardiac check. Patient declined and stated he would wait for PCP appointment on 12/23. Dmc explained risks,  offered one more time patient still declined. Patient does have urology appointment 12/26 he is deciding if he wants a Uro lift. Patient was curious if he needed to take his medications after procedure, [facility] noted that some patients do and some do not, [facility] suggest that patient express his concerns at appointment. Patients daughter is having him for Christmas. Patient is still involved in BHI he is considering light therapy. Patients BP was 140/70's usually. However last was 118/72. Patient denies any other complaints. Patient doe snot need any refills at this time.  DMC will continue to follow patient.     Care Management Tasks Completed:  Chart review completed.  Social determinants of health reviewed and updated as needed.  Care plan  reviewed/discussed/updated.  Interventions ongoing and updated as needed.  Goals addressed and updated.  Interventions ongoing and updated as needed.  Education completed.  Self management plan reviewed and updated.  See patient care coordination note.  Barriers to health, care plan, and goals reviewed.  Interventions ongoing and updated as needed.  Health maintenance/care gaps reviewed and discussed with patient.  Health maintenance/care gaps updated as applicable.  Discussed upcoming appointment dates and times.  Reinforced importance of keeping all provider appointments.  Reinforced the importance of taking all medications as prescribed.  Medical history, surgical history, hospitalizations, and medications reviewed and updated as applicable.    Ongoing and Updated Assessment Interventions:  Encouraged BP checks  Discussed CP/ SOB   Discussed Urology appointment questions   Reviewed acute cough and throat pain x months losartan ?   Offered to contact PCP regarding health issues , patient declined has appointment 12/23.     Case Manager To Do List for the Next Interaction:  Monthly follow up  F/U CP & SOB  F/U PCP   F/U Urology  F/U BHI     Plan to call patient in ~ one month to reassess and update plan of care.  Instructed patient to call with change in symptoms or as needed prior to next follow up.      Alden Benjamin, RN

## 2023-06-04 DIAGNOSIS — J9601 Acute respiratory failure with hypoxia: Secondary | ICD-10-CM | POA: Diagnosis not present

## 2023-06-04 DIAGNOSIS — I480 Paroxysmal atrial fibrillation: Secondary | ICD-10-CM | POA: Diagnosis not present

## 2023-06-04 DIAGNOSIS — E1121 Type 2 diabetes mellitus with diabetic nephropathy: Secondary | ICD-10-CM | POA: Diagnosis not present

## 2023-06-04 DIAGNOSIS — W19XXXA Unspecified fall, initial encounter: Secondary | ICD-10-CM

## 2023-06-04 DIAGNOSIS — N1832 Chronic kidney disease, stage 3b: Secondary | ICD-10-CM | POA: Diagnosis not present

## 2023-06-04 DIAGNOSIS — I5022 Chronic systolic (congestive) heart failure: Secondary | ICD-10-CM | POA: Diagnosis not present

## 2023-06-04 DIAGNOSIS — R1312 Dysphagia, oropharyngeal phase: Secondary | ICD-10-CM | POA: Diagnosis not present

## 2023-06-04 DIAGNOSIS — I10 Essential (primary) hypertension: Secondary | ICD-10-CM | POA: Diagnosis not present

## 2023-06-04 DIAGNOSIS — F324 Major depressive disorder, single episode, in partial remission: Secondary | ICD-10-CM | POA: Diagnosis not present

## 2023-06-07 ENCOUNTER — Telehealth: Payer: Self-pay | Admitting: *Deleted

## 2023-06-07 ENCOUNTER — Ambulatory Visit (INDEPENDENT_AMBULATORY_CARE_PROVIDER_SITE_OTHER): Payer: Commercial Managed Care - PPO | Admitting: Family Medicine

## 2023-06-07 ENCOUNTER — Ambulatory Visit (INDEPENDENT_AMBULATORY_CARE_PROVIDER_SITE_OTHER): Payer: Self-pay | Admitting: Family Medicine

## 2023-06-07 ENCOUNTER — Other Ambulatory Visit: Payer: Self-pay

## 2023-06-07 ENCOUNTER — Encounter (INDEPENDENT_AMBULATORY_CARE_PROVIDER_SITE_OTHER): Payer: Self-pay | Admitting: Family Medicine

## 2023-06-07 ENCOUNTER — Encounter: Payer: Self-pay | Admitting: *Deleted

## 2023-06-07 VITALS — BP 130/62 | HR 53 | Ht 70.88 in | Wt 253.0 lb

## 2023-06-07 DIAGNOSIS — E785 Hyperlipidemia, unspecified: Secondary | ICD-10-CM | POA: Diagnosis not present

## 2023-06-07 DIAGNOSIS — F329 Major depressive disorder, single episode, unspecified: Secondary | ICD-10-CM | POA: Diagnosis not present

## 2023-06-07 DIAGNOSIS — E1121 Type 2 diabetes mellitus with diabetic nephropathy: Secondary | ICD-10-CM | POA: Diagnosis not present

## 2023-06-07 DIAGNOSIS — L97811 Non-pressure chronic ulcer of other part of right lower leg limited to breakdown of skin: Secondary | ICD-10-CM | POA: Diagnosis not present

## 2023-06-07 DIAGNOSIS — E782 Mixed hyperlipidemia: Secondary | ICD-10-CM

## 2023-06-07 DIAGNOSIS — Z87891 Personal history of nicotine dependence: Secondary | ICD-10-CM

## 2023-06-07 DIAGNOSIS — I10 Essential (primary) hypertension: Secondary | ICD-10-CM

## 2023-06-07 DIAGNOSIS — F338 Other recurrent depressive disorders: Secondary | ICD-10-CM

## 2023-06-07 DIAGNOSIS — Z0000 Encounter for general adult medical examination without abnormal findings: Secondary | ICD-10-CM

## 2023-06-07 DIAGNOSIS — G250 Essential tremor: Secondary | ICD-10-CM

## 2023-06-07 DIAGNOSIS — B351 Tinea unguium: Secondary | ICD-10-CM

## 2023-06-07 DIAGNOSIS — K146 Glossodynia: Secondary | ICD-10-CM

## 2023-06-07 DIAGNOSIS — Z23 Encounter for immunization: Secondary | ICD-10-CM

## 2023-06-07 DIAGNOSIS — N400 Enlarged prostate without lower urinary tract symptoms: Secondary | ICD-10-CM

## 2023-06-07 NOTE — Telephone Encounter (Signed)
Tct-patient to see progress and los.  Plan is for patient to return home  once iv abx are finished.

## 2023-06-07 NOTE — Progress Notes (Signed)
PRIMARY CARE, [facility]  [address]  [place] [address]    Medicare Annual Wellness Visit    Name: Thomas [name] MRN:  [id number]   Date: 06/07/2023 Age: 77 y.o.       SUBJECTIVE:   Thomas [name] is a 77 y.o. male for presenting for Medicare Wellness exam, as well as follow up of his chronic medical problems.   I have reviewed and reconciled the medication list with the patient today.    Patient is independent and does perform most of his own activities of daily living.  He does have multiple complaints and concerns today.  He does follow with urology for BPH.  They are considering doing a UroLift on him.  He does still have symptoms and he is wondering if he will still need to continue his medications after the procedure.  I did discuss with him that he would have to discuss that with the urologist during the procedure I would not be able to answer that question for him.    He does feel that he has some discolored toenails and they are very thick and difficult for him to cut.  He does have normal sensation in his toes.  There is no pain.  He would like to be referred to Podiatry.    He does have shortness a breath at times but it is usually if he goes too far.  He knows that he has been gaining weight and he is not been exercising.  He denies any exertional chest pain.  He does follow routinely with Cardiology and has had testing done recently.  He has been doing a lot of splitting wood and he feel that that was good exercise for him and he wondered if that was okay for him to be doing.  He has been having some pain in the back of his tongue.  He can not really see if there is anything back there but it is just on the 1 side and it is causing him discomfort especially with eating.  He has been having some tremor in both of his upper extremities but it is only when he goes to do something and it is not bothersome to him at this time he just wondered if it was a side effect to  medication.  His head does not shake.  He is not dropping things.  He has no weakness of his hands.    Regarding hypertension: He continues on losartan in his tolerating it well.  His blood pressure has been well controlled.    Regarding hyperlipidemia: He does continue on Crestor 10 mg and tolerates the medication well.  Lipids are at goal.  Most recent labs as below.  Lab Results   Component Value Date    CHOLESTEROL 118 04/23/2023    HDLCHOL 59 04/23/2023    LDLCHOL 48 04/23/2023    TRIG 42 04/23/2023        Comprehensive Health Assessment:  Paper document COMPREHENSIVE HEALTH ASSESSMENT reviewed and scanned into medical record    I have reviewed and updated as appropriate the past medical, family and social history. 06/07/2023 as summarized below:  Past Medical History:   Diagnosis Date    Arthritis     Atrial flutter (CMS HCC)     Cancer (CMS HCC)     Dysuria     Enteritis due to Rotavirus     Gout     Headache     Hemorrhoid  Hypercholesterolemia     Hypertension      Past Surgical History:   Procedure Laterality Date    Hx hemorrhoidectomy       Current Outpatient Medications   Medication Sig    acetaminophen (TYLENOL) 500 mg Oral Tablet Take 1 Tablet (500 mg total) by mouth Every 4 hours as needed for Pain (pt takes tylenol pm)    alfuzosin (UROXATRAL) 10 mg Oral Tablet Sustained Release 24 hr TAKE ONE TABLET BY MOUTH EVERY DAY    apixaban (ELIQUIS) 5 mg Oral Tablet Take 1 Tablet (5 mg total) by mouth Twice daily SHIP TO PATIENT,   [id number]  STATE LICENSE- [id number]  NPI- [id number]    diltiazem HCl (TIAZAC) 240 mg Oral Capsule,Sustained Action 24 hr TAKE ONE CAPSULE BY MOUTH DAILY    finasteride (PROSCAR) 5 mg Oral Tablet TAKE 1 TABLET EVERY DAY    flecainide (TAMBOCOR) 100 mg Oral Tablet TAKE ONE TABLET BY MOUTH TWICE DAILY    furosemide (LASIX) 40 mg Oral Tablet TAKE ONE TABLET BY MOUTH EVERY DAY    losartan (COZAAR) 50 mg Oral Tablet TAKE ONE TABLET BY MOUTH ONCE DAILY    nitroGLYCERIN  (NITROSTAT) 0.4 mg Sublingual Tablet, Sublingual Place 1 Tablet (0.4 mg total) under the tongue Every 5 minutes as needed for Chest pain    potassium chloride (KLOR-CON) 10 mEq Oral Tablet Sustained Release TAKE ONE TABLET BY MOUTH EVERY DAY    rosuvastatin (CRESTOR) 10 mg Oral Tablet Take 1 Tablet (10 mg total) by mouth Every evening    SPIRIVA WITH HANDIHALER 18 mcg Inhalation Capsule, w/Inhalation Device Take 1 Capsule (18 mcg total) by inhalation Once a day     Family Medical History:       Problem Relation (Age of Onset)    Cancer Mother    Hypertension (High Blood Pressure) Mother, Father            Social History     Socioeconomic History    Marital status: Widowed   Tobacco Use    Smoking status: Former     Types: Cigars    Smokeless tobacco: Current     Types: Chew, Snuff   Vaping Use    Vaping status: Never Used   Substance and Sexual Activity    Alcohol use: Yes     Alcohol/week: 2.0 standard drinks of alcohol     Types: 2 Cans of beer per week     Comment: few days a week    Drug use: Never    Sexual activity: Not Currently     Social Determinants of Health     Financial Resource Strain: Low Risk  (03/24/2023)    Financial Resource Strain     SDOH Financial: No   Transportation Needs: Low Risk  (03/24/2023)    Transportation Needs     SDOH Transportation: No   Social Connections: Medium Risk (03/24/2023)    Social Connections     SDOH Social Isolation: 3 to 5 times a week   Intimate Partner Violence: Low Risk  (03/24/2023)    Intimate Partner Violence     SDOH Domestic Violence: No   Housing Stability: Low Risk  (03/24/2023)    Housing Stability     SDOH Housing Situation: I have housing.     SDOH Housing Worry: No   Health Literacy: Low Risk  (12/02/2022)    Health Literacy     SDOH Health Literacy: Never   Employment Status: Low Risk  (03/24/2023)  Employment Status     SDOH Employment: Otherwise unemployed but not seeking work (ex. Consulting civil engineer, retired, disabled, unpaid primary care giver)         List of  Current Health Care Providers   Care Team       PCP       Name Type Specialty Phone Number    Rhoderick Moody, [place] Physician [facility]ILY [facility]INE [phone]              Care Team       Name Type Specialty Phone Number    Alden Benjamin, RN Registered Nurse Not available Not available    Tasia Catchings, MSW Social Worker Not available [phone]    Doristine Bosworth, MD Physician PSYCHIATRY [phone]                      Health Maintenance   Topic Date Due    RSV Adult 60+ or Pregnancy (1 - 1-dose 75+ series) Never done    Shingles Vaccine (2 of 2) 08/26/2022    Covid-19 Vaccine (6 - 2024-25 season) 02/14/2023    Adult Tdap-Td (1 - Tdap) 05/25/2026    Hepatitis C screening  Completed    Influenza Vaccine  Completed    Medicare Annual Wellness Visit - Calendar Year Insurers  Completed    Pneumococcal Vaccination, Age 55+  Completed     Medicare Wellness Assessment   Medicare initial or wellness physical in the last year?: Yes  Advance Directives   Does patient have a living will or MPOA: No           Advance directive information given to the patient today?: Patient Declined      Activities of Daily Living   Do you need help with dressing, bathing, or walking?: No   Do you need help with shopping, housekeeping, medications, or finances?: No   Do you have rugs in hallways, broken steps, or poor lighting?: No   Do you have grab bars in your bathroom, non-slip strips in your tub, and hand rails on your stairs?: Yes (non slip strips in tub)   Cognitive Function Screen (1=Yes, 0=No)   What is you age?: Correct   What is the time to the nearest hour?: Correct   What is the year?: Correct   What is the name of this clinic?: Correct   Can the patient recognize two persons (the doctor, the nurse, home help, etc.)?: Correct   What is the date of your birth? (day and month sufficient) : Correct   In what year did World War II end?: Correct   Who is the current president of the [place]?: Correct   Count from 20 down to 1?:  Correct   What address did I give you earlier?: Correct   Total Score: 10   Interpretation of Total Score: Greater than 6 Normal   Fall Risk Screen   Do you feel unsteady when standing or walking?: Yes (sometimes)  Do you worry about falling?: No  Have you fallen in the past year?: No  Timed up and go test (in seconds): 10   Depression Screen     Little interest or pleasure in doing things.: More than half the days (pt is concerned for SAD.)  Feeling down, depressed, or hopeless: More than half the days (pt is concerned for SAD.)  PHQ 2 Total: 4  Trouble falling or staying asleep, or sleeping too much.: Several Days (sleep patterns are all over  the place)  Feeling tired or having little energy: Several Days  Poor appetite or overeating: More than half the days  Feeling bad about yourself/ that you are a failure in the past 2 weeks?: Not at all  Trouble concentrating on things in the past 2 weeks?: Not at all  Moving/Speaking slowly or being fidgety or restless  in the past 2 weeks?: Not at all  Thoughts that you would be better off DEAD, or of hurting yourself in some way.: Not at all  PHQ 9 Total: 8  Interpretation of Total Score: 5-9 Mild depression     Pain Score   Pain Score:   3    Substance Use-Abuse Screening     Tobacco Use     In Past 12 MONTHS, how often have you used any tobacco product (for example, cigarettes, e-cigarettes, cigars, pipes, or smokeless tobacco)?: Daily (smokeless tobacco)  In the PAST 3 MONTHS, did you smoke a cigarette containing tobacco or use any other nicotine delivery product (i.e., e-cigarette, vaping or chewing tobacco)?: Yes  In the PAST 3 MONTHS, did you usually smoke more than 10 cigarettes, vape, use an e-cigarette or chew tobacco more than 10 times each day?: No (once a day in all day)  In the PAST 3 MONTHS, did you usually smoke/use an e-cigarette, vape or chew tobacco within 30 minutes after waking?: No     Alcohol use     In the PAST 12 MONTHS, how often have you had 5  (men)/4 (women) or more drinks containing alcohol in one day?: Never     Prescription Drug Use     In the PAST 12 months, how often have you used any prescription medications just for the feeling, more than prescribed, or that were not prescribed for you? Prescriptions may include: opioids, benzodiazepines, medications for ADHD: Never           Illicit Drug Use   In the PAST 12 MONTHS, how often have you used any drugs, including marijuana, cocaine or crack, heroin, methamphetamine, hallucinogens, ecstasy/MDMA?: Never                                  OBJECTIVE:   BP 130/62   Pulse 53   Ht 1.8 m (5' 10.88")   Wt 115 kg (253 lb)   SpO2 96%   BMI 35.41 kg/m        Physical Exam  Constitutional:       General: He is not in acute distress.     Appearance: Normal appearance.   HENT:      Head: Normocephalic and atraumatic.      Right Ear: Tympanic membrane normal.      Left Ear: Tympanic membrane normal.      Nose: Nose normal.      Mouth/Throat:      Mouth: Mucous membranes are moist.      Pharynx: Oropharynx is clear. No posterior oropharyngeal erythema.   Eyes:      Extraocular Movements: Extraocular movements intact.      Pupils: Pupils are equal, round, and reactive to light.   Neck:      Vascular: No carotid bruit.   Cardiovascular:      Rate and Rhythm: Normal rate and regular rhythm.      Heart sounds: No murmur heard.  Pulmonary:      Effort: Pulmonary effort is normal.  Breath sounds: Normal breath sounds. No wheezing.   Abdominal:      General: Abdomen is flat. Bowel sounds are normal.      Palpations: Abdomen is soft.      Tenderness: There is no abdominal tenderness.   Musculoskeletal:         General: No swelling, tenderness or signs of injury. Normal range of motion.      Cervical back: Neck supple.   Lymphadenopathy:      Cervical: No cervical adenopathy.   Skin:     General: Skin is warm and dry.      Findings: No rash.   Neurological:      General: No focal deficit present.      Mental Status:  He is alert and oriented to person, place, and time.      Cranial Nerves: No cranial nerve deficit.   Psychiatric:         Mood and Affect: Mood normal.         Behavior: Behavior normal.        Health Maintenance Due   Topic Date Due    RSV Adult 60+ or Pregnancy (1 - 1-dose 75+ series) Never done    Shingles Vaccine (2 of 2) 08/26/2022    Covid-19 Vaccine (6 - 2024-25 season) 02/14/2023      ASSESSMENT & PLAN:   Assessment/Plan   1. Medicare annual wellness visit, subsequent -flu shot was given today.  I did recommend RSV vaccination as well as shingles vaccination at the pharmacy   2. Other recurrent depressive disorders -she is continuing to speak with a therapist.  He really does not want to start any medication at this time   3. Onychomycosis -he was referred to Podiatry   4. Tongue pain -he was referred to ENT for further evaluation and direct visualization   5. Past history of chewing tobacco use -ENT referral   6. Essential hypertension -blood pressure well controlled continue losartan   7. Mixed hyperlipidemia -continue Crestor 10 mg.  Lipids are controlled   8. Benign prostatic hyperplasia, unspecified whether lower urinary tract symptoms present -continue following up with Urology.   9. Benign essential tremor -his symptoms are minor and he does not want to start any new medication at this time..  Will continue to follow this      Identified Risk Factors/ Recommended Actions     Fall Risk Follow up plan of care: Vitamin D supplementation advised  Discussed optimizing home safety  Footwear and potential problems addressed        Patient declined Advanced Directives information.        Orders Placed This Encounter    Flu Vaccine High-Dose, 65+,0.5 mL IM (Admin)    Refer to [facility] [employer]. [facility]    Referral to External Provider (AMB)          The patient has been educated about risk factors and recommended preventive care. Written Prevention Plan completed/ updated and given to  patient (see After Visit Summary).    Return in about 4 months (around 10/06/2023).    Rhoderick Moody, DO

## 2023-06-07 NOTE — Patient Instructions (Signed)
Medicare Preventive Services  Medicare coverage information Recommendation for YOU   Heart Disease and Diabetes   Lipid profile every 5 years or more often if at risk for cardiovascular disease  Lab Results   Component Value Date    CHOLESTEROL 118 04/23/2023    HDLCHOL 59 04/23/2023    LDLCHOL 48 04/23/2023    TRIG 42 04/23/2023       Diabetes Screening with Blood Glucose test or Glucose Tolerance Test Yearly for those at risk for diabetes, up to two tests per year for those with prediabetes  Last Glucose: 96     Diabetes Self-Management Training   Initial training ten hours per year, and follow-up training two hours per subsequent year. Optional for those with diabetes    Medical Nutrition Therapy   Three hours of one-on-one counseling in first year, two hours in subsequent years. Optional for those with diabetes, kidney disease   Intensive Behavioral Therapy for Obesity  Face-to-face counseling, first month every week, month 2-6 every other week, month 7-12 every month if continued progress is documented Optional for those with Body Mass Index 30 or higher  Your Body mass index is 35.41 kg/m.   Tobacco Cessation (Quitting) Counseling   Two attempts per year, max 4 sessions per attempt, up to 8 sessions per year Optional for those who use tobacco    Cancer Screening Last Completion Date   Colorectal screening   For anyone age 90 to 62 or any age if high risk:  Screening Colonoscopy every 10 yrs if low risk,  more frequent if higher risk  OR  Cologuard Stool DNA test once every 3 years OR  Fecal Occult Blood Testing yearly OR  Flexible  Sigmoidoscopy  every 5 yr OR  CT Colonography every 5 yrs    See below for due date if applicable.   Prostate Cancer Screening  Prostate Specific Antigen blood test based on joint decision making with your provider for ages 71-69  A joint decision between you and your primary care provider   Lung Cancer Screening  Annual low dose computed tomography (LDCT scan) is recommended for  those age 28-80 who smoked 20 pack-years and are current smokers or quit smoking within past 15 years, after counseling by your doctor or nurse clinician about the possible benefits or harms.   See below for due date if applicable.   Vaccinations   Respiratory syncytial virus (RSV)  Age 37 years or older: Based on shared clinical decision-making with your provider.  Pneumococcal Vaccine  Recommended routinely age 35+ with one or two separate vaccines based on your risk. Recommended before age 51 if medical conditions with increased risk  Seasonal Influenza Vaccine  Once every flu season   Hepatitis B Vaccine  3 doses if risk (including anyone with diabetes or liver disease)  Shingles Vaccine  Two doses at age 34 or older  Diphtheria Tetanus Pertussis Vaccine  ONCE as adult, booster every 10 years     Immunization History   Administered Date(s) Administered   . Covid-19 Vaccine,Pfizer Bivalent,11mcg/0.3ml,12 yrs+ 04/01/2021   . Covid-19 Vaccine,Pfizer-BioNTech,Purple Top,40yrs+ 08/04/2019, 08/25/2019, 05/08/2020, 10/01/2020   . Diptheria & Tetanus Toxoid,Adsorbed, 49YRS & OLDER 05/25/2016   . High-Dose Influenza Vaccine, 65+ 05/25/2016, 03/02/2019   . INFLUENZA VIRUS VACCINE (ADMIN) 05/25/2016, 03/26/2017, 05/07/2018   . Influenza Vaccine, 6 month-adult 05/07/2018, 03/29/2020, 04/01/2021   . Influenza Vaccine, 65+ 03/29/2020, 04/01/2022   . PREVNAR 13 01/22/2016, 05/24/2018   . Pneumovax 07/14/2013   .  Shingrix - Zoster Vaccine 07/01/2022     Shingles vaccine and Diphtheria Tetanus Pertussis vaccines are available at pharmacies or local health department without a prescription.   Other Preventative Screening  Last Completion Date   Glaucoma Screening   Yearly if in high risk group such as diabetes, family history, African American age 31+ or Hispanic American age 55+ See your Eye Care Provider   Hepatitis C Screening   Recommended  for those born between ages 18-79 years. --10/28/2021  See below for due date if  applicable.     HIV Testing  Recommended routinely at least ONCE, covered every year for age 51 to 21 regardless of risk, and every year for age over 24 who ask for the test or higher risk. Yearly or up to 3 times in pregnancy    See below for due date if applicable.  Bone Densitometry   Screening is recommended for Men ages 19 and above with one or more risk factor: androgen deprivation therapy for prostate cancer, hypogonadism, frailty, primary hyperparathyroidism, hyperthyroidism  For men diagnosed with osteoporosis, follow up is recommended every years or a frequency recommended by your provider     See below for due date if applicable.   Abdominal Aortic Aneurysm Screening Ultrasound   Once with a family history of abdominal aortic aneurysms OR a male between65-75 and have smoked at least 100 cigarettes in your lifetime.     See below for due date if applicable.       Your Personalized Schedule for Preventive Tests   Health Maintenance: Pending and Last Completed       Date Due Completion Date    RSV Adult 60+ or Pregnancy (1 - 1-dose 75+ series) Never done ---    Shingles Vaccine (2 of 2) 08/26/2022 07/01/2022    Influenza Vaccine (1) 02/14/2023 04/01/2022    Covid-19 Vaccine (6 - 2024-25 season) 02/14/2023 04/01/2021    Adult Tdap-Td (1 - Tdap) 05/25/2026 05/25/2016              For Information on Advanced Directives for Health Care:  [place]:  [email or web address]  [place], [place], [place], [place] General Information: [email or web address]

## 2023-06-07 NOTE — Nursing Note (Addendum)
06/07/23 1103   Medicare Wellness Assessment   Medicare initial or wellness physical in the last year? Yes   Advance Directives   Does patient have a living will or MPOA No   Advance directive information given to the patient today? Patient Declined   Activities of Daily Living   Do you need help with dressing, bathing, or walking? No   Do you need help with shopping, housekeeping, medications, or finances? No   Do you have rugs in hallways, broken steps, or poor lighting? No   Do you have grab bars in your bathroom, non-slip strips in your tub, and hand rails on your stairs? Yes  (non slip strips in tub)   Cognitive Function Screen   What is you age? 1   What is the time to the nearest hour? 1   What is the year? 1   What is the name of this clinic? 1   Can the patient recognize two persons (the doctor, the nurse, home help, etc.)? 1   What is the date of your birth? (day and month sufficient)  1   In what year did World War II end? 1   Who is the current president of the [place]? 1   Count from 20 down to 1? 1   What address did I give you earlier? 1   Total Score 10   Interpretation of Total Score Greater than 6 Normal   Depression Screen   Little interest or pleasure in doing things. 2  (pt is concerned for SAD.)   Feeling down, depressed, or hopeless 2  (pt is concerned for SAD.)   PHQ 2 Total 4   Trouble falling or staying asleep, or sleeping too much. 1  (sleep patterns are all over the place)   Feeling tired or having little energy 1   Poor appetite or overeating 2   Feeling bad about yourself/ that you are a failure in the past 2 weeks? 0   Trouble concentrating on things in the past 2 weeks? 0   Moving/Speaking slowly or being fidgety or restless  in the past 2 weeks? 0   Thoughts that you would be better off DEAD, or of hurting yourself in some way. 0   If you checked off any problems, how difficult have these problems made it for you to do your work, take care of things at home, or get along  with other people? Not difficult at all   PHQ 9 Total 8   Interpretation of Total Score Mild depression   Pain Score   Pain Score Three   Substance Use Screening   In Past 12 MONTHS, how often have you used any tobacco product (for example, cigarettes, e-cigarettes, cigars, pipes, or smokeless tobacco)? Daily  (smokeless tobacco)   In the PAST 3 MONTHS, did you smoke a cigarette containing tobacco or use any other nicotine delivery product (i.e., e-cigarette, vaping or chewing tobacco)? Yes   In the PAST 3 MONTHS, did you usually smoke more than 10 cigarettes, vape, use an e-cigarette or chew tobacco more than 10 times each day? No  (once a day in all day)   In the PAST 3 MONTHS, did you usually smoke/use an e-cigarette, vape or chew tobacco within 30 minutes after waking? No   In the PAST 12 MONTHS, how often have you had 5 (men)/4 (women) or more drinks containing alcohol in one day? Never   In the PAST 12 months, how often have  you used any prescription medications just for the feeling, more than prescribed, or that were not prescribed for you? Prescriptions may include: opioids, benzodiazepines, medications for ADHD Never   In the PAST 12 MONTHS, how often have you used any drugs, including marijuana, cocaine or crack, heroin, methamphetamine, hallucinogens, ecstasy/MDMA? Never   Fall Risk Assessment   Do you feel unsteady when standing or walking? Yes  (sometimes)   Do you worry about falling? No   Have you fallen in the past year? No   Timed up and go test (in seconds) 10   Urinary Incontinence Screen   Do you ever leak urine when you don't want to? No

## 2023-06-07 NOTE — Nursing Note (Signed)
Pt presents in office for Medicare annual. Pt is concerned about S.A.D. was told to try light therapy.   Pt has a list of questions -    Toes are purple from lack of circulation? Normal?  Increased SOB due to gaining weight?  Med side effects tremors, coughing, back of mouth hurts and drymouth?  Little cut on hand for 3 days of bleeding is this caused by Eliquis?   Increased chest pain when laying on left side opposed to right.  2 urology meds will be DC if i get urolift?  Spliting wood is good excersize, is he to old?

## 2023-06-07 NOTE — Telephone Encounter (Signed)
Regarding: Clinical Question  ----- Message from [place] [facility] sent at 06/07/2023  2:36 PM EST -----  Copied From CRM [id number].[id number] called stating Dr. Lawerance Bach does not see anyone for any toenail related issues, so the Pt needs a referral to a different podiatrist.

## 2023-06-10 ENCOUNTER — Ambulatory Visit (INDEPENDENT_AMBULATORY_CARE_PROVIDER_SITE_OTHER): Payer: Self-pay | Admitting: Urology

## 2023-06-10 NOTE — Progress Notes (Deleted)
[facility] MEDICINE  [place] [facility] - FOLLOWUP    PATIENT NAME :Thomas [name]   DATE OF SERVICE: 06/10/2023  SERVICE LOCATION: [facility]      ARTICE MINSKY is a 77 y.o. male who presents for followup of weakened stream and hesitancy with hematospermia.    He has been on oral medications for some time, and has had minimal improvement with additional Proscar.  He continues on Proscar and Uroxatral.  He denies any new changes in his urinary symptoms, in his here to review results from his recent transrectal ultrasound.  He has been able to review some literature regarding minimally invasive therapies, and is leaning towards UroLift.    From review of prior imaging, he is undergone CT scan of the abdomen pelvis in 2020, showing a 6.6 x 5.3 x 4.2cm = 76.7g prostate.    AUASS  10/2022 - 14 / 5 on max medical therapy.  05/2023 -     Past Medical History  Past Medical History:   Diagnosis Date    Arthritis     Atrial flutter (CMS HCC)     Cancer (CMS HCC)     Dysuria     Enteritis due to Rotavirus     Gout     Headache     Hemorrhoid     Hypercholesterolemia     Hypertension            Past Surgical History  Past Surgical History:   Procedure Laterality Date    HX HEMORRHOIDECTOMY             Allergies  No Known Allergies      Medications  acetaminophen (TYLENOL) 500 mg Oral Tablet, Take 1 Tablet (500 mg total) by mouth Every 4 hours as needed for Pain (pt takes tylenol pm)  alfuzosin (UROXATRAL) 10 mg Oral Tablet Sustained Release 24 hr, TAKE ONE TABLET BY MOUTH EVERY DAY  apixaban (ELIQUIS) 5 mg Oral Tablet, Take 1 Tablet (5 mg total) by mouth Twice daily SHIP TO PATIENT,   [id number]  STATE LICENSE- [id number]  NPI- [id number]  diltiazem HCl (TIAZAC) 240 mg Oral Capsule,Sustained Action 24 hr, TAKE ONE CAPSULE BY MOUTH DAILY  finasteride (PROSCAR) 5 mg Oral Tablet, TAKE 1 TABLET EVERY DAY  flecainide (TAMBOCOR) 100 mg Oral Tablet, TAKE ONE TABLET BY MOUTH TWICE DAILY  furosemide (LASIX)  40 mg Oral Tablet, TAKE ONE TABLET BY MOUTH EVERY DAY  losartan (COZAAR) 50 mg Oral Tablet, Take 1 Tablet (50 mg total) by mouth Once a day  nitroGLYCERIN (NITROSTAT) 0.4 mg Sublingual Tablet, Sublingual, Place 1 Tablet (0.4 mg total) under the tongue Every 5 minutes as needed for Chest pain  potassium chloride (KLOR-CON) 10 mEq Oral Tablet Sustained Release, TAKE ONE TABLET BY MOUTH EVERY DAY  rosuvastatin (CRESTOR) 10 mg Oral Tablet, Take 1 Tablet (10 mg total) by mouth Every evening  SPIRIVA WITH HANDIHALER 18 mcg Inhalation Capsule, w/Inhalation Device, Take 1 Capsule (18 mcg total) by inhalation Once a day    No facility-administered medications prior to visit.      OBJECTIVE:  There were no vitals taken for this visit.      General: No apparent distress, well appearing  HEENT:  NCAT, EOMI, OP clear  Neck:  supple, trachea midline  RESP:  Nonlabored breathing, no use of accessory muscles   CV: No extremity swelling  Neuro:  Neurological exam is consistent with patients age.     GU:  Phallus within  normal limits, orthotopic meatus.  Scrotal skin and contents within normal limits  DRE: completed last office visit  Psych: alert, appropriate mood, and in no acute distress.      MSK: Ambulates without assistance, bilateral compression stockings in place      PSA:  05/2016 - 1.30  06/2017 - 2.60  04/2021 - 2.22  04/2022 - 1.66    TRUS Suzing  11/2022  1. Prostate gland heterogeneous echogenicity, no solid mass; a few tiny nonaggressive cysts.   2. Prostate gland not enlarged, 42.7 cm3 (25.0-55.0 cm3).        ASSESSMENT:   No diagnosis found.              PLAN: 77 year old male with history of BPH w/ LUTS and hematospermia  - we discussed his AUA symptom score, and considerable hypertrophy of the prostate seen on cystoscopy, and findings of prostate less than 50 g.  We discussed continued path of observation, alternative alpha blockers or 5 alpha reductase inhibitors, PDE5 inhibitors, and surgical options. We  discussed minimally invasive therapies, particularly UroLift and Rezum, and goals for improvement in his quality of life.  We discussed ejaculatory changes, erectile dysfunction, and he was not currently bothered by these, as he remains a widower.  - I will see him back in 6 months with a symptom check, or earlier if he decides to proceed with intervention.  - 20 minutes was spent in review of his recent prostate ultrasound results.  We reviewed these images in the office, as well as has a additional discussion regarding bladder outlet management options.    No orders of the defined types were placed in this encounter.    Nolon Nations, MD   [facility]

## 2023-06-11 ENCOUNTER — Other Ambulatory Visit (HOSPITAL_COMMUNITY): Payer: Self-pay | Admitting: *Deleted

## 2023-06-11 ENCOUNTER — Ambulatory Visit (HOSPITAL_COMMUNITY): Payer: Self-pay

## 2023-06-11 DIAGNOSIS — I10 Essential (primary) hypertension: Secondary | ICD-10-CM | POA: Diagnosis not present

## 2023-06-11 DIAGNOSIS — W19XXXA Unspecified fall, initial encounter: Secondary | ICD-10-CM

## 2023-06-11 DIAGNOSIS — R1310 Dysphagia, unspecified: Secondary | ICD-10-CM

## 2023-06-11 DIAGNOSIS — E1121 Type 2 diabetes mellitus with diabetic nephropathy: Secondary | ICD-10-CM | POA: Diagnosis not present

## 2023-06-11 DIAGNOSIS — N1832 Chronic kidney disease, stage 3b: Secondary | ICD-10-CM | POA: Diagnosis not present

## 2023-06-11 DIAGNOSIS — I480 Paroxysmal atrial fibrillation: Secondary | ICD-10-CM | POA: Diagnosis not present

## 2023-06-11 DIAGNOSIS — R1312 Dysphagia, oropharyngeal phase: Secondary | ICD-10-CM | POA: Diagnosis not present

## 2023-06-11 DIAGNOSIS — K219 Gastro-esophageal reflux disease without esophagitis: Secondary | ICD-10-CM | POA: Diagnosis not present

## 2023-06-11 DIAGNOSIS — I5022 Chronic systolic (congestive) heart failure: Secondary | ICD-10-CM | POA: Diagnosis not present

## 2023-06-11 DIAGNOSIS — J9601 Acute respiratory failure with hypoxia: Secondary | ICD-10-CM | POA: Diagnosis not present

## 2023-06-11 DIAGNOSIS — F324 Major depressive disorder, single episode, in partial remission: Secondary | ICD-10-CM | POA: Diagnosis not present

## 2023-06-11 DIAGNOSIS — F338 Other recurrent depressive disorders: Secondary | ICD-10-CM

## 2023-06-11 NOTE — Nursing Note (Signed)
Behavioral Health Integration--Brief Note  BHI Enrollment Date Patient was enrolled in BHI on 04/14/2023  9:51 AM.     PCP Rhoderick Moody, DO   Collaborating Psychiatrist: Doristine Bosworth, MD  Behavioral Health Case Manager: Tasia Catchings, MSW        Patient called MSW on 06/10/23 and alerted he did purchase a therapy lamp. MSW attempted to reach patient back this date, no success. MSW left message and MSW availability for returned call.   Total BHI time spent this encounter is   minutes.    Tasia Catchings, MSW  06/11/2023, 09:02  Behavioral Health Case Manager

## 2023-06-11 NOTE — Nursing Note (Signed)
POPULATION HEALTH  Behavioral Health Integration - Psychiatric Collaborative Care - Follow Up    BHI Enrollment Date Patient was enrolled in BHI on 04/14/2023  9:51 AM.    PCP Rhoderick Moody, DO   Behavioral Health Case Manager Tasia Catchings, MSW  Collaborating Psychiatrist Doristine Bosworth, MD     ASSESSMENT/ PLAN:     MSW spoke with patient via telephone this date, his daughter got him a light therapy lamp for christmas. He is setting it up, and needed reminded on length of time Dr. Hyacinth Meeker recommended, which is 30 minutes for 7 days to assess effectiveness. Patient reports he used it yesterday for the first time and noticed increase motivation. MSW will follow.      Brief Intervention(s):   Emotional Support  Follow with regular PHQ-9     Patient Goals:    Goals Addressed                   This Visit's Progress     Anxiety Symptoms Monitored and Managed   On track     Evidence-based guidance:   Explore treatment options in a youth-friendly atmosphere of hope and optimism [e.g., cognitive behavioral therapy, pharmacologic therapy, adjunct therapy (applied relaxation, mindfulness, meditation)].   Encourage ?obooster? sessions of cognitive behavioral therapy to prevent relapse that may occur 6 to 24 months after treatment has concluded.   Prepare child and caregiver for pharmacologic therapy (e.g., benzodiazepine) as short-term treatment to alleviate severe symptoms while allotting time for anxiolytic medication to become therapeutic, as well as allow for engagement    in treatment.   Prepare patient for short-term pharmacologic therapy when symptoms become debilitating (e.g., selective serotonin-reuptake inhibitor, serotonin-norepinephrine-reuptake inhibitor, tricyclic antidepressant).   Anticipate referral to behavioral health specialist or psychiatry when first-line treatments are not effective, anxiety is complicated by alcohol or substance use, symptoms interfere with social functioning, secondary  depression or    suicidality occurs.   Consider parent-based training when child participation in cognitive behavioral therapy is not possible or child is not responding to treatment.   Address barriers to treatment that may include long wait times, delays in scheduling appointments, lack of pediatric mental health providers, transportation, inadequate or no insurance or stigma associated with mental health    disorders and treatment.    Notes:                 SUBJECTIVE:    Current Behavioral Health History:  Behavioral Health Care Management - Encounter Level:   Recent Suicidal Thoughts?: NONE  Psychiatric Medication Assessment:  Brief Interventions Provided this Encounter: Emotional Support  Safety risk related to mental health?: Neg          OBJECTIVE:  Validated Measures:       04/14/2023    10:00 AM 05/24/2023    11:00 AM 06/07/2023    11:03 AM   Most Recent PHQ-9 Scores   PHQ 9 Total 5 5 8        Little interest or pleasure in doing things.: 2 (pt is concerned for SAD.)  Feeling down, depressed, or hopeless: 2 (pt is concerned for SAD.)  PHQ 2 Total: 4  Trouble falling or staying asleep, or sleeping too much.: 1 (sleep patterns are all over the place)  Feeling tired or having little energy: 1  Poor appetite or overeating: 2  Feeling bad about yourself/ that you are a failure in the past 2 weeks?: 0  Trouble concentrating on things in the past 2  weeks?: 0  Moving/Speaking slowly or being fidgety or restless  in the past 2 weeks?: 0  Thoughts that you would be better off DEAD, or of hurting yourself in some way.: 0  If you checked off any problems, how difficult have these problems made it for you to do your work, take care of things at home, or get along with other people?: Not difficult at all  PHQ 9 Total: 8  Interpretation of Total Score: Mild depression          Ongoing and Updated Assessment Interventions:  PHQ9  GAD       Case Manager To Do List for the Next Interaction:   Routine follow up and follow up  on light therapy.      Plan to call patient in ~ one month to reassess and update plan of care.  Instructed patient to call with change in symptoms or as needed prior to next follow up.       Tasia Catchings, MSW  06/11/2023, 11:16  Behavioral Health Case Manager

## 2023-06-14 ENCOUNTER — Other Ambulatory Visit (INDEPENDENT_AMBULATORY_CARE_PROVIDER_SITE_OTHER): Payer: Self-pay | Admitting: Physician Assistant

## 2023-06-14 ENCOUNTER — Other Ambulatory Visit (INDEPENDENT_AMBULATORY_CARE_PROVIDER_SITE_OTHER): Payer: Self-pay | Admitting: Family Medicine

## 2023-06-14 NOTE — Telephone Encounter (Signed)
Last Visit:06/07/2023     Upcoming appointments: 10/11/2023           Joaquin Courts, MA  06/14/2023, 16:50

## 2023-06-15 ENCOUNTER — Ambulatory Visit (HOSPITAL_COMMUNITY): Payer: Self-pay

## 2023-06-15 ENCOUNTER — Ambulatory Visit (INDEPENDENT_AMBULATORY_CARE_PROVIDER_SITE_OTHER): Payer: Self-pay | Admitting: Family Medicine

## 2023-06-15 DIAGNOSIS — F338 Other recurrent depressive disorders: Secondary | ICD-10-CM

## 2023-06-15 NOTE — Nursing Note (Signed)
POPULATION HEALTH  Behavioral Health Integration - Psychiatric Case Review     Behavioral Health Integration - Psychiatric Collaborative Care   Behavioral Health Integration - BHI  Status: Enrolled  Effective Dates: 04/14/2023 - present  Responsible Staff: Tasia Catchings, MSW      Behavioral Health Integration Care Team:  [id number] [place], DO  Behavioral Health Case Manager Tasia Catchings, MSW  Collaborating Psychiatrist: Doristine Bosworth, MD     ASSESSMENT/ PLAN: (Actions for PCP to Take)       MSW provided update to Dr. Hyacinth Meeker on patient's starting light therapy, first experience was positive and resulted in increased motivation. No further recommendations for PCP at this time.   Recommendations for PCP regarding pharmacologic management: See Dr. Hyacinth Meeker note for details. We recommend changes (if appropriate) be made within 5 business days for continuation of care for patient.     Recommendations for Case Manager (additional assessments or interventions  or linkages/referrals to consider): Monitor effectiveness of light therapy, encourage consideration of therapy specifically through fall/winter, PHQ9, emotional support.         SUBJECTIVE/ OBJECTIVE:  Thomas [name]'s case was reviewed by collaborating psychiatrist and BH case manager today in a telephone-video conference call.  The collaborating psychiatrist was provided a summary of the patient's behavioral health by the case manager including results of validated measures compared to baseline. The psychiatrist reviewed health history and case manager notes in the medical record.     Diagnosis Review:   No suggestions/revisions to diagnosis made at this time.      Program Progression Review:   BHI Program Proression Review: Continue Enrollment within Behavioral Health Integration      Summary of Past Behavioral Health History:  Past Behavioral Health History:     Current Behavioral Health History:  Behavioral Health Care Management - Encounter Level:    Psychiatric Medication Assessment:          PHQ 9 Follow Up Questionnaire:            04/14/2023    10:00 AM 05/24/2023    11:00 AM 06/07/2023    11:03 AM   Most Recent PHQ-9 Scores   PHQ 9 Total 5 5 8            06/07/2023    11:03 AM   Most Recent PHQ-9 Scores   PHQ 9 Total 8     Little interest or pleasure in doing things.: 2 (pt is concerned for SAD.)  Feeling down, depressed, or hopeless: 2 (pt is concerned for SAD.)  PHQ 2 Total: 4  Trouble falling or staying asleep, or sleeping too much.: 1 (sleep patterns are all over the place)  Feeling tired or having little energy: 1  Poor appetite or overeating: 2  Feeling bad about yourself/ that you are a failure in the past 2 weeks?: 0  Trouble concentrating on things in the past 2 weeks?: 0  Moving/Speaking slowly or being fidgety or restless  in the past 2 weeks?: 0  Thoughts that you would be better off DEAD, or of hurting yourself in some way.: 0  If you checked off any problems, how difficult have these problems made it for you to do your work, take care of things at home, or get along with other people?: Not difficult at all  PHQ 9 Total: 8  Interpretation of Total Score: Mild depression    Last Gad-7 Scale:            04/14/2023  10:00 AM   Most Recent GAD-7 Scores   Gad-7 Score Total 4          Patient Goals:    Goals Addressed    None          Total BHI time spent this encounter is 5 minutes.     Total BHI time spent this month is 23 minutes.       Tasia Catchings, MSW  06/15/2023, 10:35  I have read and reviewed the social worker's note. I agree with the current plan of care.    Doristine Bosworth, MD

## 2023-06-15 NOTE — Progress Notes (Signed)
Behavioral Health Integration --Last encounter this month  Behavioral Health Integration - BHI  Status: Enrolled  Effective Dates: 04/14/2023 - present  Responsible Staff: Tasia Catchings, MSW      BHI Care Team:   Primary Care Provider Rhoderick Moody, DO  Behavioral Health Case Manager Tasia Catchings, MSW  Collaborating Psychiatrist Doristine Bosworth, MD    Behavioral Health Integration Psychiatric Collaborative Care services have been performed by behavioral health case manager during this calendar month under the supervision of primary care provider Rhoderick Moody, DO for behavioral health condition(s):  (F33.8) Seasonal depression (CMS [facility])  (primary encounter diagnosis)      The patient hasbeen added to a patient roster for weekly case load review with collaborating psychiatrist.  [name] treatment plan and status has been reviewed in weekly case conference with collaborating psychiatrist.    Brief interventions provided by case manager during this calendar month: emotional support, mindfulness, and light therapy support    Validated Measures used to monitor patient progress during this calendar month:      04/14/2023    10:00 AM 05/24/2023    11:00 AM 06/07/2023    11:03 AM   Most Recent PHQ-9 Scores   PHQ 9 Total 5 5 8            04/14/2023    10:00 AM   Most Recent GAD-7 Scores   Gad-7 Score Total 4           Total BHI time spent this month is 23 minutes.     Tasia Catchings, MSW  06/15/2023, 13:32

## 2023-06-17 LAB — ACID FAST CULTURE WITH REFLEXED SENSITIVITIES (MYCOBACTERIA): Acid Fast Culture: NEGATIVE

## 2023-06-18 ENCOUNTER — Other Ambulatory Visit (INDEPENDENT_AMBULATORY_CARE_PROVIDER_SITE_OTHER): Payer: Self-pay

## 2023-06-18 DIAGNOSIS — I10 Essential (primary) hypertension: Secondary | ICD-10-CM

## 2023-06-18 DIAGNOSIS — E782 Mixed hyperlipidemia: Secondary | ICD-10-CM

## 2023-06-18 DIAGNOSIS — Z7189 Other specified counseling: Secondary | ICD-10-CM

## 2023-06-18 DIAGNOSIS — F338 Other recurrent depressive disorders: Secondary | ICD-10-CM

## 2023-06-18 NOTE — Progress Notes (Signed)
Dr. Rhoderick Moody, DO ,    This patient has met the requirements to bill for COMPLEX Chronic Care Management this month.     Please add .[email or web address] to this note for attestation and billing.      Chronic Care Management Time Documentation on 06/18/2023 09:20.   Time spent during current encounter is 1 minutes.   Cumulative time during current month's episode (month-to-date) is 45 minutes.          ICD-10-CM    1. Seasonal depression (CMS HCC)  F33.8       2. Encounter for counseling for care management of patient with chronic conditions and complex health needs using nurse-based model  Z71.89       3. Essential hypertension  I10       4. Mixed hyperlipidemia  E78.2             acetaminophen (TYLENOL) 500 mg Oral Tablet, Take 1 Tablet (500 mg total) by mouth Every 4 hours as needed for Pain (pt takes tylenol pm)  alfuzosin (UROXATRAL) 10 mg Oral Tablet Sustained Release 24 hr, TAKE ONE TABLET BY MOUTH EVERY DAY  apixaban (ELIQUIS) 5 mg Oral Tablet, Take 1 Tablet (5 mg total) by mouth Twice daily SHIP TO PATIENT,   [id number]  STATE LICENSE- [id number] L  NPI- [id number]  diltiazem HCl (TIAZAC) 240 mg Oral Capsule,Sustained Action 24 hr, TAKE ONE CAPSULE BY MOUTH DAILY  finasteride (PROSCAR) 5 mg Oral Tablet, TAKE 1 TABLET EVERY DAY  flecainide (TAMBOCOR) 100 mg Oral Tablet, TAKE ONE TABLET BY MOUTH TWICE DAILY  furosemide (LASIX) 40 mg Oral Tablet, TAKE ONE TABLET BY MOUTH EVERY DAY  losartan (COZAAR) 50 mg Oral Tablet, TAKE ONE TABLET BY MOUTH ONCE DAILY  nitroGLYCERIN (NITROSTAT) 0.4 mg Sublingual Tablet, Sublingual, Place 1 Tablet (0.4 mg total) under the tongue Every 5 minutes as needed for Chest pain  potassium chloride (KLOR-CON) 10 mEq Oral Tablet Sustained Release, TAKE ONE TABLET BY MOUTH EVERY DAY  rosuvastatin (CRESTOR) 10 mg Oral Tablet, Take 1 Tablet (10 mg total) by mouth Every evening  SPIRIVA WITH HANDIHALER 18 mcg Inhalation Capsule, w/Inhalation Device, Take 1 Capsule (18 mcg total) by inhalation  Once a day    No facility-administered medications prior to visit.      Thanks,  Your Chronic Care Management Team

## 2023-06-21 ENCOUNTER — Ambulatory Visit (INDEPENDENT_AMBULATORY_CARE_PROVIDER_SITE_OTHER): Payer: Self-pay | Admitting: Otolaryngology

## 2023-06-21 ENCOUNTER — Ambulatory Visit (HOSPITAL_COMMUNITY): Payer: Self-pay

## 2023-06-21 DIAGNOSIS — F338 Other recurrent depressive disorders: Secondary | ICD-10-CM

## 2023-06-21 NOTE — Nursing Note (Signed)
POPULATION HEALTH  Behavioral Health Integration - Psychiatric Collaborative Care - Follow Up    BHI Enrollment Date Patient was enrolled in BHI on 04/14/2023  9:51 AM.    PCP Rhoderick Moody, DO   Behavioral Health Case Manager Tasia Catchings, MSW  Collaborating Psychiatrist Doristine Bosworth, MD     ASSESSMENT/ PLAN:      MSW spoke with patient via telephone for follow up. He reports he is has been using the light therapy. He desires some additional information based on what to expect. He reports after the initial exposure, he has not felt quiet as significant impact despite using daily. He said he is currently about 6 feet away for an hour and a half, and finds that more beneficial than the 30 minutes at 3 feet. MSW will discuss further with Dr. Hyacinth Meeker in next psych collab meeting.   Patient is agreeable to linkage to therapy, in office preferred or willing to do telehealth. [place] or [place], however he identified [place] is a closer location.   MSW left message for Serenity Brock.   MSW spoke with rachel from [place] [facility] - they have one therapy whom accepts patient insurance, she is currently on a wait, but she was going to e-mail the therapist to check availability, then contact patient directly when there is an opening.   MSW will follow.      Brief Intervention(s):   Emotional Support  Follow with regular PHQ-9     Patient Goals:    Goals Addressed                   This Visit's Progress     Anxiety Symptoms Monitored and Managed   On track     Evidence-based guidance:   Explore treatment options in a youth-friendly atmosphere of hope and optimism [e.g., cognitive behavioral therapy, pharmacologic therapy, adjunct therapy (applied relaxation, mindfulness, meditation)].   Encourage ?obooster? sessions of cognitive behavioral therapy to prevent relapse that may occur 6 to 24 months after treatment has concluded.   Prepare child and caregiver for pharmacologic therapy (e.g.,  benzodiazepine) as short-term treatment to alleviate severe symptoms while allotting time for anxiolytic medication to become therapeutic, as well as allow for engagement    in treatment.   Prepare patient for short-term pharmacologic therapy when symptoms become debilitating (e.g., selective serotonin-reuptake inhibitor, serotonin-norepinephrine-reuptake inhibitor, tricyclic antidepressant).   Anticipate referral to behavioral health specialist or psychiatry when first-line treatments are not effective, anxiety is complicated by alcohol or substance use, symptoms interfere with social functioning, secondary depression or    suicidality occurs.   Consider parent-based training when child participation in cognitive behavioral therapy is not possible or child is not responding to treatment.   Address barriers to treatment that may include long wait times, delays in scheduling appointments, lack of pediatric mental health providers, transportation, inadequate or no insurance or stigma associated with mental health    disorders and treatment.    Notes:                 SUBJECTIVE:    Current Behavioral Health History:  Behavioral Health Care Management - Encounter Level:   Current Symptoms: depressed mood  Recent Suicidal Thoughts?: NONE  Recent Homicidal Thoughts?: denies homicidal ideation  Psychiatric Medication Assessment:  Brief Interventions Provided this Encounter: Emotional Support  Safety risk related to mental health?: Neg          OBJECTIVE:  Validated Measures:       04/14/2023  10:00 AM 05/24/2023    11:00 AM 06/07/2023    11:03 AM   Most Recent PHQ-9 Scores   PHQ 9 Total 5 5 8        Little interest or pleasure in doing things.: 2 (pt is concerned for SAD.)  Feeling down, depressed, or hopeless: 2 (pt is concerned for SAD.)  PHQ 2 Total: 4  Trouble falling or staying asleep, or sleeping too much.: 1 (sleep patterns are all over the place)  Feeling tired or having little energy: 1  Poor appetite or  overeating: 2  Feeling bad about yourself/ that you are a failure in the past 2 weeks?: 0  Trouble concentrating on things in the past 2 weeks?: 0  Moving/Speaking slowly or being fidgety or restless  in the past 2 weeks?: 0  Thoughts that you would be better off DEAD, or of hurting yourself in some way.: 0  If you checked off any problems, how difficult have these problems made it for you to do your work, take care of things at home, or get along with other people?: Not difficult at all  PHQ 9 Total: 8  Interpretation of Total Score: Mild depression          Ongoing and Updated Assessment Interventions:   PHQ9  GAD     Case Manager To Do List for the Next Interaction:   Routine follow up   Follow up on therapy referrals, and seek additional providers if needed.      Plan to call patient in ~ one month to reassess and update plan of care.  Instructed patient to call with change in symptoms or as needed prior to next follow up.       Tasia Catchings, MSW  06/21/2023, 12:31  Behavioral Health Case Manager

## 2023-06-22 ENCOUNTER — Ambulatory Visit (HOSPITAL_COMMUNITY): Payer: Self-pay | Admitting: Specialist

## 2023-06-22 ENCOUNTER — Ambulatory Visit (HOSPITAL_COMMUNITY): Payer: Self-pay

## 2023-06-22 DIAGNOSIS — F4321 Adjustment disorder with depressed mood: Secondary | ICD-10-CM

## 2023-06-22 DIAGNOSIS — F338 Other recurrent depressive disorders: Secondary | ICD-10-CM

## 2023-06-22 DIAGNOSIS — F3341 Major depressive disorder, recurrent, in partial remission: Secondary | ICD-10-CM

## 2023-06-22 DIAGNOSIS — R4589 Other symptoms and signs involving emotional state: Secondary | ICD-10-CM

## 2023-06-22 NOTE — Nursing Note (Signed)
POPULATION HEALTH  Behavioral Health Integration - Psychiatric Case Review     Behavioral Health Integration - Psychiatric Collaborative Care   Behavioral Health Integration - BHI  Status: Enrolled  Effective Dates: 04/14/2023 - present  Responsible Staff: Tasia Catchings, MSW      Behavioral Health Integration Care Team:  KY[facility]:CWCBJ [place], DO  Behavioral Health Case Manager Tasia Catchings, MSW  Collaborating Psychiatrist: Doristine Bosworth, MD     ASSESSMENT/ PLAN: (Actions for PCP to Take)     NO change for PCP at this time, patient remains uninterested in medication management.   Recommendations for PCP regarding pharmacologic management: See Dr. Hyacinth Meeker note for details. We recommend changes (if appropriate) be made within 5 business days for continuation of care for patient.     Recommendations for Case Manager (additional assessments or interventions  or linkages/referrals to consider): Dr. Hyacinth Meeker would like to speak with patient directly. MSW informed Dr. Hyacinth Meeker of patient's desire to know more about the light therapy, and his time and distance from the light.   Dr. Hyacinth Meeker plans to call patient.  MSW encouraged to continue to work toward linkage to therapy. MSW to provide emotional support, continue with pHQ9.         SUBJECTIVE/ OBJECTIVE:  Thomas [name]'s case was reviewed by collaborating psychiatrist and BH case manager today in a telephone-video conference call.  The collaborating psychiatrist was provided a summary of the patient's behavioral health by the case manager including results of validated measures compared to baseline. The psychiatrist reviewed health history and case manager notes in the medical record.     Diagnosis Review:   No suggestions/revisions to diagnosis made at this time.      Program Progression Review:   BHI Program Proression Review: Continue Enrollment within Behavioral Health Integration      Summary of Past Behavioral Health History:  Past Behavioral Health  History:     Current Behavioral Health History:  Behavioral Health Care Management - Encounter Level:   Psychiatric Medication Assessment:          PHQ 9 Follow Up Questionnaire:            04/14/2023    10:00 AM 05/24/2023    11:00 AM 06/07/2023    11:03 AM   Most Recent PHQ-9 Scores   PHQ 9 Total 5 5 8            06/07/2023    11:03 AM   Most Recent PHQ-9 Scores   PHQ 9 Total 8     Little interest or pleasure in doing things.: 2 (pt is concerned for SAD.)  Feeling down, depressed, or hopeless: 2 (pt is concerned for SAD.)  PHQ 2 Total: 4  Trouble falling or staying asleep, or sleeping too much.: 1 (sleep patterns are all over the place)  Feeling tired or having little energy: 1  Poor appetite or overeating: 2  Feeling bad about yourself/ that you are a failure in the past 2 weeks?: 0  Trouble concentrating on things in the past 2 weeks?: 0  Moving/Speaking slowly or being fidgety or restless  in the past 2 weeks?: 0  Thoughts that you would be better off DEAD, or of hurting yourself in some way.: 0  If you checked off any problems, how difficult have these problems made it for you to do your work, take care of things at home, or get along with other people?: Not difficult at all  PHQ 9 Total: 8  Interpretation of Total Score: Mild depression    Last Gad-7 Scale:            04/14/2023    10:00 AM   Most Recent GAD-7 Scores   Gad-7 Score Total 4          Patient Goals:    Goals Addressed    None          Total BHI time spent this encounter is 5 minutes.     Total BHI time spent this month is 25 minutes.       Tasia Catchings, MSW  06/22/2023,   I have read and reviewed the social worker's note. I agree with the current plan of care.    Doristine Bosworth, MD

## 2023-06-22 NOTE — Progress Notes (Signed)
Behavioral Health Integration--Psychiatric Collaborative Care Note  BHI Enrollment Date: Patient was enrolled in BHI on 04/14/2023  9:51 AM.    PCP: Rhoderick Moody, DO   Collaborating Psychiatrist: Doristine Bosworth, MD,  Medicine Psychiatry  Behavioral Health Case Manager: Tasia Catchings, MSW     Reviewed and discussed patient during BHI collaborative call. On 06/22/2023     Recommendations:   This pt struggles with grief, loneliness and seasonal mood changes for which bright white light therapy helped at first but now he is not so sure. I tried to call twice to explain how to use the light correctly but could not catch him at home. The energy form the light falls off with there square of the distance and thus he should position the light within 18-24 inches having it bather the face but it need not be stared at. One can read or do other things for 30-45 minutes every am. He is not interested in meds, otherwise, he could also be given Wellbutrin which works well.  I will coniute to try to call him to have this discussion on the proper use fo the light device. We are searching for a therapist for him.      To ensure continuity of care and full participation in this model, we kindly request that these recommendations be reviewed and addressed within the next five business days. If you have any questions or would like to discuss further, please feel free to reach out to me or the Behavioral Health Case Manager assigned to this patient.     Doristine Bosworth, MD , MD Collaborating Psychiatrist    To discuss further, please call [phone]      The above treatment considerations and suggestions are based on consultations with the patient's case manager and a review of information available in the medical record.  I have not personally examined the patient.  All recommendations should be implemented with consideration of the patient's relevant prior history and current clinical status.  Please feel free to call me with  any questions about the care of this patients.

## 2023-06-23 ENCOUNTER — Telehealth: Payer: Self-pay | Admitting: *Deleted

## 2023-06-24 ENCOUNTER — Other Ambulatory Visit (INDEPENDENT_AMBULATORY_CARE_PROVIDER_SITE_OTHER): Payer: Self-pay

## 2023-06-24 DIAGNOSIS — Z7189 Other specified counseling: Secondary | ICD-10-CM

## 2023-06-24 NOTE — Progress Notes (Signed)
ENT, [facility]  [address]  [place] [address]  [phone]    PATIENT NAME:  Thomas [name]  MRN:  [id number]  DOB:  December 10, 1945  DATE OF SERVICE: 06/28/2023    Chief Complaint:  No chief complaint on file.      HPI:  Thomas [name] is a 78 y.o. male presenting as a new patient for evaluation of mouth symptoms.     Past Medical History:  Past Medical History:   Diagnosis Date    Arthritis     Atrial flutter (CMS HCC)     Cancer (CMS HCC)     Dysuria     Enteritis due to Rotavirus     Gout     Headache     Hemorrhoid     Hypercholesterolemia     Hypertension          Past Surgical History:  Past Surgical History:   Procedure Laterality Date    HX HEMORRHOIDECTOMY        Family History:  Family Medical History:       Problem Relation (Age of Onset)    Cancer Mother    Hypertension (High Blood Pressure) Mother, Father            Social History:  Social History     Tobacco Use   Smoking Status Former    Types: Cigars   Smokeless Tobacco Current    Types: Chew, Snuff     Social History     Substance and Sexual Activity   Alcohol Use Yes    Alcohol/week: 2.0 standard drinks of alcohol    Types: 2 Cans of beer per week    Comment: few days a week     Social History     Occupational History    Not on file       Medications:  No outpatient medications have been marked as taking for the 06/28/23 encounter (Appointment) with Marcene Duos, MD.       Allergies:  No Known Allergies    Review of Systems:                                                        All other systems reviewed and found to be negative.    Physical Exam:  There were no vitals taken for this visit.  There is no height or weight on file to calculate BMI.  General Appearance: Pleasant, cooperative, healthy, and in no acute distress.  Eyes: Conjunctivae/corneas clear, PERRLA, EOM's intact.  Head and Face: Normocephalic, atraumatic.  Face symmetric, no obvious lesions.   Right Ear:       Pinnae: Normal shape and position.         External auditory canals: Patent without inflammation.       Tympanic membranes:  Intact, translucent, midposition, middle ear aerated.  Left Ear:       Pinnae: Normal shape and position.        External auditory canals: Patent without inflammation.       Tympanic membranes:  Intact, translucent, midposition, middle ear aerated.  Nose: External pyramid midline. Mucosa normal. No purulence, polyps, or crusts.   Oral Cavity/Oropharynx: No mucosal lesions, masses, or pharyngeal asymmetry.  Tonsils: Deferred.  Nasopharynx: Deferred.  Hypopharynx/Larynx: Deferred.  Neck: no cervical  adenopathy, no palpable thyroid or salivary gland masses  Thyroid: no significant thyroid abnormality by palpation  Cardiovascular: Good perfusion of upper extremities.  No cyanosis of the hands or fingers.  Lungs: No apparent stridorous breathing. No acute distress.  Skin: Skin warm and dry.  Neurologic: Cranial nerves:  grossly intact.  Psychiatric: Alert and oriented x 3.    Procedure:       Data Reviewed:  N/A    Assessment:  ***    Plan:  No orders of the defined types were placed in this encounter.    ***  F/U {OLIFOLLOW:51218} for reevaluation      Marcene Duos, MD  Department of Otolaryngology  [facility]    PCP: Rhoderick Moody, DO  [address]  [place] [address]   REF: Rhoderick Moody, DO  [address]  [place],  [place] [address]

## 2023-06-24 NOTE — Nursing Note (Signed)
POPULATION HEALTH    DISEASE MANAGEMENT COORDINATOR    Patient reports that he received a letter from [facility] stating that his Eliquis coverage will expire and no longer covered, patient was wondering how much it would be . DMC encouraged patient to call insurance company and ask. DMC offered extra help application if needed.  Also he was notified that he would need a copay for his two medications at [facility] and may have to change pharmacy. DMC noted to call insurance and call [facility] back and will assist with pending new script to new pharmacy if needed.       Alden Benjamin, RN, BSN  Disease Management Coordinator  [employer]  [phone]

## 2023-06-25 ENCOUNTER — Other Ambulatory Visit: Payer: Self-pay | Admitting: *Deleted

## 2023-06-25 DIAGNOSIS — I10 Essential (primary) hypertension: Secondary | ICD-10-CM | POA: Diagnosis not present

## 2023-06-25 DIAGNOSIS — I5022 Chronic systolic (congestive) heart failure: Secondary | ICD-10-CM | POA: Diagnosis not present

## 2023-06-25 DIAGNOSIS — R1312 Dysphagia, oropharyngeal phase: Secondary | ICD-10-CM | POA: Diagnosis not present

## 2023-06-25 DIAGNOSIS — I480 Paroxysmal atrial fibrillation: Secondary | ICD-10-CM | POA: Diagnosis not present

## 2023-06-25 DIAGNOSIS — F324 Major depressive disorder, single episode, in partial remission: Secondary | ICD-10-CM | POA: Diagnosis not present

## 2023-06-25 DIAGNOSIS — K219 Gastro-esophageal reflux disease without esophagitis: Secondary | ICD-10-CM | POA: Diagnosis not present

## 2023-06-25 DIAGNOSIS — N1832 Chronic kidney disease, stage 3b: Secondary | ICD-10-CM | POA: Diagnosis not present

## 2023-06-25 DIAGNOSIS — E1121 Type 2 diabetes mellitus with diabetic nephropathy: Secondary | ICD-10-CM | POA: Diagnosis not present

## 2023-06-25 DIAGNOSIS — J9601 Acute respiratory failure with hypoxia: Secondary | ICD-10-CM | POA: Diagnosis not present

## 2023-06-25 NOTE — Patient Outreach (Signed)
 Post- Acute Care Manager follow up. Mr. Vidales resides in Mr. She[place] is active with [facility] CCM team.   Previous update received from [place], [employer] social worker indicating Mr. [name] continues to work with therapy. Transition plans not stated.  Will continue to follow and plan outreach to Mr. Hunger son [name]).  Updated [facility] RNCM.  Pablo Hurst, MSN, RN, BSN [place]  [facility], [facility] Commun[facility] RN Post- Acute Care Manager Direct Dial: [phone]

## 2023-06-28 ENCOUNTER — Ambulatory Visit (INDEPENDENT_AMBULATORY_CARE_PROVIDER_SITE_OTHER): Payer: Commercial Managed Care - PPO | Admitting: Otolaryngology

## 2023-06-28 ENCOUNTER — Other Ambulatory Visit: Payer: Self-pay

## 2023-06-28 ENCOUNTER — Encounter (INDEPENDENT_AMBULATORY_CARE_PROVIDER_SITE_OTHER): Payer: Self-pay | Admitting: Otolaryngology

## 2023-06-28 ENCOUNTER — Other Ambulatory Visit: Payer: Self-pay | Admitting: *Deleted

## 2023-06-28 VITALS — Temp 97.3°F | Ht 71.0 in | Wt 259.6 lb

## 2023-06-28 DIAGNOSIS — J9601 Acute respiratory failure with hypoxia: Secondary | ICD-10-CM | POA: Diagnosis not present

## 2023-06-28 DIAGNOSIS — B370 Candidal stomatitis: Secondary | ICD-10-CM

## 2023-06-28 DIAGNOSIS — H6120 Impacted cerumen, unspecified ear: Secondary | ICD-10-CM

## 2023-06-28 DIAGNOSIS — Z87891 Personal history of nicotine dependence: Secondary | ICD-10-CM

## 2023-06-28 DIAGNOSIS — H6123 Impacted cerumen, bilateral: Secondary | ICD-10-CM

## 2023-06-28 DIAGNOSIS — K146 Glossodynia: Secondary | ICD-10-CM

## 2023-06-28 MED ORDER — LIDOCAINE 4% AND PHENYLEPHRINE 1% NASAL SOLUTION
3.0000 [drp] | Freq: Once | NASAL | Status: AC
Start: 2023-06-28 — End: 2023-06-28
  Administered 2023-06-28: 3 [drp] via NASAL

## 2023-06-28 MED ORDER — CLOTRIMAZOLE 10 MG TROCHE
10.0000 mg | Freq: Three times a day (TID) | 1 refills | Status: DC
Start: 2023-06-28 — End: 2023-07-06

## 2023-06-28 MED ORDER — NYSTATIN 100,000 UNIT/ML ORAL SUSPENSION
5.0000 mL | Freq: Four times a day (QID) | ORAL | 1 refills | Status: DC
Start: 2023-06-28 — End: 2023-07-06

## 2023-06-28 NOTE — Patient Outreach (Signed)
 Post- Acute Care Manager follow up. Tanner [name] resides in [facility]. He is active with [facility]) chronic care management team.   Telephone call made to [facility] (son/HCPOA) [phone]. Patient identifiers confirmed. Tanner [name] reports Tanner [name] will return home today. States appeal was lost and he did not want to do a second level appeal due to the potential costs if 2nd level appeal was lost too.   Tanner [name] reports Tanner [name] will return home alone. States he is doing better than he was before. States he is at wheelchair level. Reports Tanner [name] has medical alert system. States he has installed grab bars in the home but is in need of a ramp. Writer to email ramp rental information to [place]. Also discussed engaging with [facility] for potential benefits for ramp and care giver assistance at home. Discussed clinical research associate will refer for SW assistance.   Tanner [name] reports Tanner [name] will have [facility] home health. Writer has also sent secure message and voicemail to SNF social worker.    Tanner [name] (son/DPR/HCPOA) reports he is primary contact at [phone]  Update sent to [facility] team.  Pablo Hurst, MSN, RN, BSN [place]  [facility], Healthy Communities RN Post- Acute Care Manager Direct Dial: [phone]

## 2023-06-28 NOTE — Procedures (Signed)
ENT, [facility]  [address] [place] [address]    Procedure Note    Name: Thomas [name] MRN:  [id number]   Date: 06/28/2023 DOB:  [birth year] (77 y.o.)         Ear Cerumen Removal    Performed by: Marcene Duos, MD  Authorized by: Marcene Duos, MD    Consent:     Consent obtained:  Verbal    Consent given by:  Patient    Risks, benefits, and alternatives were discussed: yes    Universal protocol:     Procedure explained and questions answered to patient or proxy's satisfaction: yes      Relevant documents present and verified: yes    Post-procedure details:     Procedure completion:  Tolerated  Comments:      Cerumen Removal:    Bilateral EAC(s) examined under binocular microscopy . Tympanic membranes occluded. Impacted cerumen was cleaned from the canal(s) using curette and #7 suction.  Patient tolerated procedure well.  Underlying anatomy was normal.       A time-out was performed before the procedure to validate patient identification and insure right patient, right procedure, right equipment.        Frederich Chick, SCRIBE    I personally performed the services described in this documentation, as scribed  in my presence, and it is both accurate  and complete.    Marcene Duos, MD

## 2023-06-28 NOTE — Procedures (Signed)
ENT, [facility]  [address] [place] [address]    Procedure Note    Name: Thomas [name] MRN:  [id number]   Date: 06/28/2023 DOB:  [birth year] (77 y.o.)         Generic Procedure    Performed by: Marcene Duos, MD  Authorized by: Marcene Duos, MD    Time Out:     Immediately before the procedure, a time out was called:  Yes    Patient verified:  Yes    Procedure Verified:  Yes    Site Verified:  Yes  Documentation:      Flexible Fiberoptic Nasopharyngolaryngoscopy   Anesthesia: Topical 4% lidocaine and phenlyephrine  Findings: Visualization of the hypopharynx/larynx with mirror not adequate for examination and fiberoptic examination performed. After the nasal cavity was anesthetized/decongested with topical lidocaine and neosynephrine, the scope was thus advanced through the left nasal passage    Nasopharynx:  Normal mucosa and no lesions of the nasopharyngeal walls. The Eustachian tube orifices/ fossae of Rosenmueller were normal.   Hypopharynx:  No lesions of the epiglottis, posterior pharyngeal walls, or piriform mucosa. There is no pooling of secretions.   Larynx:  The vocal cords are mobile and adduct to midline bilaterally.  There is good abduction with sniff. The airway is patent.   Subglottis:  Grossly normal with limited visualization.    Noting: Thrush noted on back of tongue       A time-out was performed before the procedure to validate patient identification and insure right patient, right procedure, right equipment.        Frederich Chick, SCRIBE    I personally performed the services described in this documentation, as scribed  in my presence, and it is both accurate  and complete.    Marcene Duos, MD

## 2023-06-29 ENCOUNTER — Other Ambulatory Visit: Payer: Self-pay | Admitting: *Deleted

## 2023-06-29 ENCOUNTER — Telehealth: Payer: Self-pay | Admitting: *Deleted

## 2023-06-29 DIAGNOSIS — I5022 Chronic systolic (congestive) heart failure: Secondary | ICD-10-CM

## 2023-06-29 NOTE — Progress Notes (Signed)
 Complex Care Management Note Care Guide Note  06/29/2023 Name: Tanner [name] MRN: [id number] DOB: 1946/03/04   Complex Care Management Outreach Attempts: An unsuccessful telephone outreach was attempted today to offer the patient information about available complex care management services.  Follow Up Plan:  Additional outreach attempts will be made to offer the patient complex care management information and services.   Encounter Outcome:  No Answer  Harlene Satterfield  Care Coordination Care Guide  Direct Dial: [phone]

## 2023-06-29 NOTE — Progress Notes (Signed)
 Complex Care Management Note  Care Guide Note 06/29/2023 Name: Tanner [name] MRN: [id number] DOB: 1946/06/09  Tanner [name] is a 78 y.o. year old male who sees Amin, Saad, MD for primary care. I reached out to Tanner [name] by phone today to offer complex care management services.  Tanner [name] was given information about Complex Care Management services today including:   The Complex Care Management services include support from the care team which includes your Nurse Coordinator, Clinical Social Worker, or Pharmacist.  The Complex Care Management team is here to help remove barriers to the health concerns and goals most important to you. Complex Care Management services are voluntary, and the patient may decline or stop services at any time by request to their care team member.   Complex Care Management Consent Status: Patient agreed to services and verbal consent obtained. (By Tanner [name] POA )  Follow up plan:  Telephone appointment with complex care management team member scheduled for:  [facility] 06/30/23 and SW 1/16  Encounter Outcome:  Patient Scheduled  [facility] Coordination Care Guide  Direct Dial: [phone]

## 2023-06-30 ENCOUNTER — Ambulatory Visit: Payer: Self-pay

## 2023-06-30 NOTE — Patient Instructions (Signed)
 Visit Information  Thank you for taking time to visit with me today. Please don't hesitate to contact me if I can be of assistance to you.   Following are the goals we discussed today:   Goals Addressed             This Visit's Progress    Gain strength for better mobility       Interventions Today    Flowsheet Row Most Recent Value  Chronic Disease   Chronic disease during today's visit Congestive Heart Failure (CHF), Diabetes, Chronic Kidney Disease/End Stage Renal Disease (ESRD)  General Interventions   General Interventions Discussed/Reviewed General Interventions Discussed, Doctor Visits, Durable Medical Equipment (DME)  Doctor Visits Discussed/Reviewed Doctor Visits Discussed, PCP  Durable Medical Equipment (DME) Wheelchair  PCP/Specialist Visits Compliance with follow-up visit  Education Interventions   Education Provided Provided Education  Provided Verbal Education On [employer], Other  [Discussed heart failure and monitoring.  Discussed home health services and [facility] social work for possible ramp]  Safety Interventions   Safety Discussed/Reviewed Safety Reviewed, Fall Risk, Safety Discussed  Advanced Directive Interventions   Advanced Directives Discussed/Reviewed Advanced Directives Discussed             Next appointment is by telephone on 07/14/23 at 130 pm with Juana Wallace, RN  Please call the care guide team at [phone] if you need to cancel or reschedule your appointment.   If you are experiencing a Mental Health or Behavioral Health Crisis or need someone to talk to, please call the Suicide and Crisis Lifeline: 988   Patient verbalizes understanding of instructions and care plan provided today and agrees to view in MyChart. Active MyChart status and patient understanding of how to access instructions and care plan via MyChart confirmed with patient.     The patient has been provided with contact information for the care management team and has  been advised to call with any health related questions or concerns.   Larken Urias J Shamyia Grandpre, RN, MSN [facility], [facility] RN Care Manager Direct Dial: [phone]  Fax: [phone] Website: [email or web address]

## 2023-06-30 NOTE — Patient Outreach (Signed)
  Care Coordination   Initial Visit Note   06/30/2023 Name: Tanner [name] MRN: [id number] DOB: July 04, 1945  Tanner [name] is a 78 y.o. year old male who sees Tita Form, MD for primary care. I  spoke with son [name].  He reports patient doing okay since being at home. Patient lives alone but [name] does visit.  He states patient at this time can walk some but mainly stays in wheelchair as he has more weakness due to ongoing health issues and hospitalizations.  [name] is unable to review medications as patient manages his own medications. He states [facility] came by on yesterday for a visit but [name] states he was not present for the visit.  He is inquiring about a possible ramp through the [place].  Advised him that I could get him information about the VA home program and that could possibly help him.  He verbalized understanding states his dad is [facility].  PCP appointment set for 07-13-23 with his PCP Dr. [name].      What matters to the patients health and wellness today?  Gain strength for better mobility    Goals Addressed             This Visit's Progress    Gain strength for better mobility       Interventions Today    Flowsheet Row Most Recent Value  Chronic Disease   Chronic disease during today's visit Congestive Heart Failure (CHF), Diabetes, Chronic Kidney Disease/End Stage Renal Disease (ESRD)  General Interventions   General Interventions Discussed/Reviewed General Interventions Discussed, Doctor Visits, Durable Medical Equipment (DME)  Doctor Visits Discussed/Reviewed Doctor Visits Discussed, PCP  Durable Medical Equipment (DME) Wheelchair  PCP/Specialist Visits Compliance with follow-up visit  Education Interventions   Education Provided Provided Education  Provided Verbal Education On [employer], Other  [Discussed heart failure and monitoring.  Discussed home health services and [facility] social work for possible ramp]  Safety  Interventions   Safety Discussed/Reviewed Safety Reviewed, Fall Risk, Safety Discussed  Advanced Directive Interventions   Advanced Directives Discussed/Reviewed Advanced Directives Discussed              SDOH assessments and interventions completed:  Yes  SDOH Interventions Today    Flowsheet Row Most Recent Value  SDOH Interventions   Food Insecurity Interventions Intervention Not Indicated  Housing Interventions Intervention Not Indicated  Transportation Interventions Intervention Not Indicated  Utilities Interventions Intervention Not Indicated        Care Coordination Interventions:  Yes, provided   Follow up plan: Follow up call scheduled for 07/14/23    Encounter Outcome:  Patient Visit Completed   Elowyn Raupp J Christyanna Mckeon, RN, MSN [place]  [facility], [facility] Health RN Care Manager Direct Dial: [phone]  Fax: [phone] Website: [email or web address]

## 2023-07-01 ENCOUNTER — Ambulatory Visit: Payer: Self-pay | Admitting: Licensed Clinical Social Worker

## 2023-07-01 ENCOUNTER — Other Ambulatory Visit (INDEPENDENT_AMBULATORY_CARE_PROVIDER_SITE_OTHER): Payer: Self-pay | Admitting: Physician Assistant

## 2023-07-01 NOTE — Telephone Encounter (Signed)
Spoke with son pt is home and doing well.  [facility] is coming as scheduled.  Is able to walk short distances.  No other needs.

## 2023-07-01 NOTE — Patient Instructions (Signed)
Visit Information  Thank you for taking time to visit with me today. Please don't hesitate to contact me if I can be of assistance to you.   Following are the goals we discussed today:   Goals Addressed             This Visit's Progress    Increase access to community resources       Care Coordination Interventions: Assessed Social Determinants of Health Reviewed all upcoming appointments in Epic system Solution-Focused Strategies employed:  Active listening / Reflection utilized  Complete referral for Meals on Wheels Contact [facility] for assistance with wheelchair ramp Contact [facility] to schedule new appt, send message to care team with update on pt's new care needs.         Our next appointment is by telephone on 07/22/2023.  Please call the care guide team at [phone] if you need to cancel or reschedule your appointment.   If you are experiencing a Mental Health or Behavioral Health Crisis or need someone to talk to, please call the Suicide and Crisis Lifeline: 988  Patient verbalizes understanding of instructions and care plan provided today and agrees to view in MyChart. Active MyChart status and patient understanding of how to access instructions and care plan via MyChart confirmed with patient.     Telephone follow up appointment with care management team member scheduled for: 07/22/2023

## 2023-07-01 NOTE — Patient Outreach (Signed)
  Care Coordination   Initial Visit Note   07/01/2023 Name: Tanner [name] MRN: [id number] DOB: September 30, 1945  Tanner [name] is a 78 y.o. year old male who sees No primary care provider on file. for primary care. I spoke with  [name] by phone today.  What matters to the patients health and wellness today?  Increasing access to community resources.    Goals Addressed             This Visit's Progress    Increase access to community resources       Care Coordination Interventions: Assessed Social Determinants of Health Reviewed all upcoming appointments in Epic system Solution-Focused Strategies employed:  Active listening / Reflection utilized  Complete referral for Meals on Wheels Contact [facility] for assistance with wheelchair ramp Contact [facility] to schedule new appt, send message to care team with update on pt's new care needs.          SDOH assessments and interventions completed:  Yes     Care Coordination Interventions:  Yes, provided  Interventions Today    Flowsheet Row Most Recent Value  Chronic Disease   Chronic disease during today's visit Other  [Community Resources]  General Interventions   General Interventions Discussed/Reviewed Community Resources  [Pt was recently discharged from skilled facility, pt is now wheelchair bound and could use MOW and a ramp. CSW was able to complete MOW referral, updated family on waitlist - they are assisting pt with food for now. Updated them on [place] [facility] for ramp assist]  Education Interventions   Education Provided Provided Education  [CSW and son discussed navigating [facility] benefits - pt was most recently seen by [place] four months ago - no upcoming appt. We discussed making new appt and sending care team message to update them on new care needs.]  Safety Interventions   Safety Discussed/Reviewed Home Safety  [Son reports pt is doing well at home with managing his care needs. Pt has multiple family members who visit daily. Pt  has a fall button and we discussed placing cameras in the home to be able to check on pt.]        Follow up plan: Follow up call scheduled for 07/22/2023    Encounter Outcome:  Patient Visit Completed   Kenton Kingfisher, LCSW [place], [facility] Licensed Clinical Social Worker Care Coordinator [phone]

## 2023-07-02 ENCOUNTER — Telehealth (INDEPENDENT_AMBULATORY_CARE_PROVIDER_SITE_OTHER): Payer: Self-pay | Admitting: Family Medicine

## 2023-07-02 ENCOUNTER — Ambulatory Visit (HOSPITAL_COMMUNITY): Payer: Self-pay

## 2023-07-02 DIAGNOSIS — F338 Other recurrent depressive disorders: Secondary | ICD-10-CM

## 2023-07-02 NOTE — Telephone Encounter (Signed)
Received Fax from [facility] [facility] Fax [phone] Phone [phone].     They are requesting     Flecainide 100 mg tablet Fill date 06/25/2023    If no quantity is filled they will fill 100 day supply.     Refer to Careers information officer for reference.

## 2023-07-02 NOTE — Nursing Note (Signed)
POPULATION HEALTH  Behavioral Health Integration - Psychiatric Collaborative Care - Follow Up    BHI Enrollment Date Patient was enrolled in BHI on 04/14/2023  9:51 AM.    PCP Rhoderick Moody, DO   Behavioral Health Case Manager Tasia Catchings, MSW  Collaborating Psychiatrist Doristine Bosworth, MD     ASSESSMENT/ PLAN:     MSW spoke with patient via telephone this date. He has not yet connected with Dr. Hyacinth Meeker via telephone - he asked MSW to provide Dr. Hyacinth Meeker with his cell phone number rather than his house, as he does wish to speak with Dr. Hyacinth Meeker still about the light therapy.    Patient reports he has had some "on and off" pain in his left shoulder. He has not contacted his PCP about. MSW offered to contact PCP office to try to get an appointment for him to be seen. He declined. MSW talked with patient about considering a visit to the Emergency room. He reports this has happened once before by history and "it wasn't a heart attack". Patient said he thinks it could be a muscle from chopping wood. MSW still encouraged him to consider being checked out, he said if he returns he will. MSW will alert [facility] and send a high priority note to PCP as well to let be aware.      Brief Intervention(s):   Emotional Support  Communication Skills  Follow with regular PHQ-9     Patient Goals:    Goals Addressed                   This Visit's Progress     Anxiety Symptoms Monitored and Managed   On track     Evidence-based guidance:   Explore treatment options in a youth-friendly atmosphere of hope and optimism [e.g., cognitive behavioral therapy, pharmacologic therapy, adjunct therapy (applied relaxation, mindfulness, meditation)].   Encourage ?obooster? sessions of cognitive behavioral therapy to prevent relapse that may occur 6 to 24 months after treatment has concluded.   Prepare child and caregiver for pharmacologic therapy (e.g., benzodiazepine) as short-term treatment to alleviate severe symptoms while allotting  time for anxiolytic medication to become therapeutic, as well as allow for engagement    in treatment.   Prepare patient for short-term pharmacologic therapy when symptoms become debilitating (e.g., selective serotonin-reuptake inhibitor, serotonin-norepinephrine-reuptake inhibitor, tricyclic antidepressant).   Anticipate referral to behavioral health specialist or psychiatry when first-line treatments are not effective, anxiety is complicated by alcohol or substance use, symptoms interfere with social functioning, secondary depression or    suicidality occurs.   Consider parent-based training when child participation in cognitive behavioral therapy is not possible or child is not responding to treatment.   Address barriers to treatment that may include long wait times, delays in scheduling appointments, lack of pediatric mental health providers, transportation, inadequate or no insurance or stigma associated with mental health    disorders and treatment.    Notes:                 SUBJECTIVE:    Current Behavioral Health History:  Behavioral Health Care Management - Encounter Level:   Current Symptoms: is nervous/anxious  Recent Suicidal Thoughts?: NONE  Recent Homicidal Thoughts?: denies homicidal ideation  Psychiatric Medication Assessment:  Brief Interventions Provided this Encounter: Emotional Support, Communication Skills  Safety risk related to mental health?: Neg          OBJECTIVE:  Validated Measures:       04/14/2023  10:00 AM 05/24/2023    11:00 AM 06/07/2023    11:03 AM   Most Recent PHQ-9 Scores   PHQ 9 Total 5 5 8        Little interest or pleasure in doing things.: 2 (pt is concerned for SAD.)  Feeling down, depressed, or hopeless: 2 (pt is concerned for SAD.)  PHQ 2 Total: 4  Trouble falling or staying asleep, or sleeping too much.: 1 (sleep patterns are all over the place)  Feeling tired or having little energy: 1  Poor appetite or overeating: 2  Feeling bad about yourself/ that you are a failure in  the past 2 weeks?: 0  Trouble concentrating on things in the past 2 weeks?: 0  Moving/Speaking slowly or being fidgety or restless  in the past 2 weeks?: 0  Thoughts that you would be better off DEAD, or of hurting yourself in some way.: 0  If you checked off any problems, how difficult have these problems made it for you to do your work, take care of things at home, or get along with other people?: Not difficult at all  PHQ 9 Total: 8  Interpretation of Total Score: Mild depression          Ongoing and Updated Assessment Interventions:  PHQ9 and GAD     Case Manager To Do List for the Next Interaction:   Routine follow up       Plan to call patient in ~ one month to reassess and update plan of care.  Instructed patient to call with change in symptoms or as needed prior to next follow up.       Tasia Catchings, MSW  07/02/2023, 11:35  Behavioral Health Case Manager

## 2023-07-03 NOTE — Telephone Encounter (Signed)
This needs to be coming from his Cardiologist

## 2023-07-05 ENCOUNTER — Other Ambulatory Visit (INDEPENDENT_AMBULATORY_CARE_PROVIDER_SITE_OTHER): Payer: Self-pay | Admitting: Family

## 2023-07-05 MED ORDER — FLECAINIDE 100 MG TABLET
100.0000 mg | ORAL_TABLET | Freq: Two times a day (BID) | ORAL | 5 refills | Status: DC
Start: 2023-07-05 — End: 2023-11-24

## 2023-07-05 NOTE — Telephone Encounter (Signed)
REVIEWED ACTIVE MEDICATIONS. PARAMETERS FOR REFILL MET PER DEPARTMENTAL REFILL PROTOCOL. REFILL SENT ELECTRONICALLY TO PHARMACY. ORDER FORWARDED TO PROVIDER TO CO-SIGN.  Saxton Chain, RN

## 2023-07-05 NOTE — Telephone Encounter (Signed)
Script sent  

## 2023-07-05 NOTE — Telephone Encounter (Signed)
Can you please refer to the media manager for the medication for pt.

## 2023-07-06 ENCOUNTER — Other Ambulatory Visit (INDEPENDENT_AMBULATORY_CARE_PROVIDER_SITE_OTHER): Payer: Self-pay

## 2023-07-06 ENCOUNTER — Ambulatory Visit (HOSPITAL_COMMUNITY): Payer: Self-pay

## 2023-07-06 ENCOUNTER — Telehealth: Payer: Self-pay | Admitting: Internal Medicine

## 2023-07-06 DIAGNOSIS — F338 Other recurrent depressive disorders: Secondary | ICD-10-CM

## 2023-07-06 DIAGNOSIS — Z7189 Other specified counseling: Secondary | ICD-10-CM

## 2023-07-06 MED ORDER — FUROSEMIDE 40 MG TABLET
40.0000 mg | ORAL_TABLET | Freq: Every day | ORAL | 3 refills | Status: DC
Start: 2023-07-06 — End: 2023-09-17

## 2023-07-06 MED ORDER — CLOTRIMAZOLE 10 MG TROCHE
10.0000 mg | Freq: Three times a day (TID) | 1 refills | Status: DC
Start: 2023-07-06 — End: 2023-09-02

## 2023-07-06 MED ORDER — NYSTATIN 100,000 UNIT/ML ORAL SUSPENSION
5.0000 mL | Freq: Four times a day (QID) | ORAL | 1 refills | Status: DC
Start: 2023-07-06 — End: 2023-10-14

## 2023-07-06 MED ORDER — POTASSIUM CHLORIDE ER 10 MEQ TABLET,EXTENDED RELEASE
10.0000 meq | ORAL_TABLET | Freq: Every day | ORAL | 5 refills | Status: AC
Start: 2023-07-06 — End: ?

## 2023-07-06 MED ORDER — ALFUZOSIN ER 10 MG TABLET,EXTENDED RELEASE 24 HR
10.0000 mg | ORAL_TABLET | Freq: Every day | ORAL | 0 refills | Status: DC
Start: 2023-07-06 — End: 2023-07-08

## 2023-07-06 MED ORDER — ROSUVASTATIN 10 MG TABLET
10.0000 mg | ORAL_TABLET | Freq: Every evening | ORAL | 3 refills | Status: DC
Start: 2023-07-06 — End: 2024-04-22

## 2023-07-06 MED ORDER — LOSARTAN 50 MG TABLET
50.0000 mg | ORAL_TABLET | Freq: Every day | ORAL | 3 refills | Status: DC
Start: 2023-07-06 — End: 2023-10-14

## 2023-07-06 NOTE — Telephone Encounter (Signed)
Copied from CRM [id number]. Topic: Clinical - Home Health Verbal Orders >> Jul 06, 2023  8:56 AM Steele Sizer wrote: Caller/Agency: [facility]  Callback Number: [phone]  Service Requested: Occupational Therapy Frequency: 1 time a week for 5 weeks  Any new concerns about the patient? No

## 2023-07-06 NOTE — Nursing Note (Signed)
POPULATION HEALTH  Behavioral Health Integration - Psychiatric Case Review     Behavioral Health Integration - Psychiatric Collaborative Care   Behavioral Health Integration - BHI  Status: Enrolled  Effective Dates: 04/14/2023 - present  Responsible Staff: Tasia Catchings, MSW      Behavioral Health Integration Care Team:  [id number]:[id number] Odessa, DO  Behavioral Health Case Manager Tasia Catchings, MSW  Collaborating Psychiatrist: Doristine Bosworth, MD     ASSESSMENT/ PLAN: (Actions for PCP to Take)     Dr. Hyacinth Meeker will try patient's cell phone as requested to speak with patient directly. No change at this time.   Recommendations for PCP regarding pharmacologic management: See Dr. Hyacinth Meeker note for details. We recommend changes (if appropriate) be made within 5 business days for continuation of care for patient.     Recommendations for Case Manager (additional assessments or interventions  or linkages/referrals to consider): MSW provided update to Dr. Hyacinth Meeker that MSW did get patient on a waitlist for therapy that accepts his insurance and is geographically close by for him. MSW provided Dr. Hyacinth Meeker with patient's cell phone number to contact the patient directly.         SUBJECTIVE/ OBJECTIVE:  Thomas [name]'s case was reviewed by collaborating psychiatrist and BH case manager today in a telephone-video conference call.  The collaborating psychiatrist was provided a summary of the patient's behavioral health by the case manager including results of validated measures compared to baseline. The psychiatrist reviewed health history and case manager notes in the medical record.     Diagnosis Review:   No change      Program Progression Review:   BHI Program Proression Review: Continue Enrollment within Behavioral Health Integration      Summary of Past Behavioral Health History:  Past Behavioral Health History:     Current Behavioral Health History:  Behavioral Health Care Management - Encounter Level:   Psychiatric  Medication Assessment:          PHQ 9 Follow Up Questionnaire:            04/14/2023    10:00 AM 05/24/2023    11:00 AM 06/07/2023    11:03 AM   Most Recent PHQ-9 Scores   PHQ 9 Total 5 5 8            06/07/2023    11:03 AM   Most Recent PHQ-9 Scores   PHQ 9 Total 8     Little interest or pleasure in doing things.: 2 (pt is concerned for SAD.)  Feeling down, depressed, or hopeless: 2 (pt is concerned for SAD.)  PHQ 2 Total: 4  Trouble falling or staying asleep, or sleeping too much.: 1 (sleep patterns are all over the place)  Feeling tired or having little energy: 1  Poor appetite or overeating: 2  Feeling bad about yourself/ that you are a failure in the past 2 weeks?: 0  Trouble concentrating on things in the past 2 weeks?: 0  Moving/Speaking slowly or being fidgety or restless  in the past 2 weeks?: 0  Thoughts that you would be better off DEAD, or of hurting yourself in some way.: 0  If you checked off any problems, how difficult have these problems made it for you to do your work, take care of things at home, or get along with other people?: Not difficult at all  PHQ 9 Total: 8  Interpretation of Total Score: Mild depression    Last Gad-7 Scale:  04/14/2023    10:00 AM   Most Recent GAD-7 Scores   Gad-7 Score Total 4          Patient Goals:    Goals Addressed    None          Total BHI time spent this encounter is 5 minutes.     Total BHI time spent this month is 35 minutes.       Tasia Catchings, MSW  07/06/2023, 18:45  I have read and reviewed the social worker's note. I agree with the current plan of care.    Doristine Bosworth, MD

## 2023-07-06 NOTE — Nursing Note (Signed)
POPULATION HEALTH    COMPLEX CARE MANAGEMENT    Chronic Care Management - CCM  Status: Enrolled  Effective Dates: 12/02/2022 - present  Responsible Staff: Alden Benjamin, RN    Behavioral Health Integration - BHI  Status: Enrolled  Effective Dates: 04/14/2023 - present  Responsible Staff: Tasia Catchings, MSW          Patient reports to be doing well. He does state that he started having eft shoulder pain worsening on 1/9 it was around 8 out of ten. Patient states it has happen three days  since then original start date of 1/9. Patient states the other three times pain was 4 out of 10. Patient did check his blood pressure when it was 8/10 and it was 136/72. Patient is okay with [facility] sending message for potential order of outpatient testing. Patient did not take any Nitro during any of the pain episodes. Patient denies SOB.  Patient states he is active and does cut firewood and he also  was sweeping walks during  this winter weather. Patient was concerned when [facility] stated they would have higher costs for Eliquis and other medications if he continues to use [facility], patient did contact [employer] and they will cover medications if all go through [employer] with lower costs. [facility] will pend new scripts and change any other pharmacy from [facility] to [facility]. [facility] will also update patient on providers response from messages. [facility] will continue to follow patient.     Care Management Tasks Completed:  Chart review completed.  Social determinants of health reviewed and updated as needed.  Care plan reviewed/discussed/updated.  Interventions ongoing and updated as needed.  Goals addressed and updated.  Interventions ongoing and updated as needed.  Education completed.  Self management plan reviewed and updated.  See patient care coordination note.  Barriers to health, care plan, and goals reviewed.  Interventions ongoing and updated as needed.  Health maintenance/care gaps reviewed and discussed with patient.   Health maintenance/care gaps updated as applicable.  Discussed upcoming appointment dates and times.  Reinforced importance of keeping all provider appointments.  Reinforced the importance of taking all medications as prescribed.  Medical history, surgical history, hospitalizations, and medications reviewed and updated as applicable.    Ongoing and Updated Assessment Interventions:  Encouraged BP Checks  Discussed Uro appointment and pro's cons of Uro lift  Confirmed upcoming appointments Uro and Otopharyngeal  Reviewed Patients pain log   Discussed patients  need to [facility] all [facility] due to [facility] changes  Pending new scripts to providers  for changes  Messaging PCP regarding should pain and cardiac concern    Case Manager To Do List for the Next Interaction:  Monthly follow up  F/U Shoulder Pain   F/U Uro Appointment  F/U Otopharyngeal appointment  F/U CP / BP     Plan to call patient in ~ one month to reassess and update plan of care.  Instructed patient to call with change in symptoms or as needed prior to next follow up.      Alden Benjamin, RN

## 2023-07-07 ENCOUNTER — Telehealth: Payer: Self-pay | Admitting: *Deleted

## 2023-07-07 NOTE — Telephone Encounter (Signed)
LVM with Zacharia from [facility].

## 2023-07-07 NOTE — Telephone Encounter (Signed)
Only okay for verbals if patient keeps upcoming apt

## 2023-07-08 ENCOUNTER — Encounter (HOSPITAL_COMMUNITY): Payer: Medicare HMO

## 2023-07-08 ENCOUNTER — Ambulatory Visit: Payer: Commercial Managed Care - PPO | Attending: Urology | Admitting: Urology

## 2023-07-08 ENCOUNTER — Other Ambulatory Visit: Payer: Self-pay

## 2023-07-08 ENCOUNTER — Encounter (INDEPENDENT_AMBULATORY_CARE_PROVIDER_SITE_OTHER): Payer: Self-pay | Admitting: Urology

## 2023-07-08 ENCOUNTER — Encounter (HOSPITAL_COMMUNITY): Payer: Self-pay

## 2023-07-08 VITALS — Resp 18 | Ht 71.0 in | Wt 259.0 lb

## 2023-07-08 DIAGNOSIS — R361 Hematospermia: Secondary | ICD-10-CM

## 2023-07-08 DIAGNOSIS — N401 Enlarged prostate with lower urinary tract symptoms: Secondary | ICD-10-CM | POA: Insufficient documentation

## 2023-07-08 DIAGNOSIS — R3912 Poor urinary stream: Secondary | ICD-10-CM

## 2023-07-08 DIAGNOSIS — R3911 Hesitancy of micturition: Secondary | ICD-10-CM

## 2023-07-08 MED ORDER — FINASTERIDE 5 MG TABLET: 5.0000 mg | ORAL_TABLET | Freq: Every day | ORAL | 3 refills | Status: DC

## 2023-07-08 MED ORDER — ALFUZOSIN ER 10 MG TABLET,EXTENDED RELEASE 24 HR: 10.0000 mg | ORAL_TABLET | Freq: Every day | ORAL | 3 refills | Status: DC

## 2023-07-08 NOTE — Progress Notes (Signed)
[facility]   [place] [facility] - [facility]PERATED BY [facility]  FOLLOW UP PATIENT VISIT    PATIENT NAME :Thomas [name]   DATE OF SERVICE: 07/08/2023  SERVICE LOCATION: [facility]      Thomas [name] is a 78 y.o. male who presents for followup of weakened stream and hesitancy with hematospermia.    He has been on oral medications for some time, and has had minimal improvement with additional Proscar.  He continues on Proscar and Uroxatral.  He denies any new changes in his urinary symptoms. He has been able to review some literature regarding minimally invasive therapies, and is leaning towards UroLift.    His most recent imaging shows a 43 g prostate.     AUASS  10/2022 - 14 / 5 on max medical therapy.  06/2023 - 8/3     Past Medical History  Past Medical History:   Diagnosis Date    Arthritis     Atrial flutter (CMS HCC)     Cancer (CMS HCC)     Dysuria     Enteritis due to Rotavirus     Gout     Headache     Hemorrhoid     Hypercholesterolemia     Hypertension            Past Surgical History  Past Surgical History:   Procedure Laterality Date    HX HEMORRHOIDECTOMY             Allergies  No Known Allergies      Medications  acetaminophen (TYLENOL) 500 mg Oral Tablet, Take 1 Tablet (500 mg total) by mouth Every 4 hours as needed for Pain (pt takes tylenol pm)  apixaban (ELIQUIS) 5 mg Oral Tablet, Take 1 Tablet (5 mg total) by mouth Twice daily SHIP TO PATIENT,   [id number]  STATE LICENSE- [id number]  NPI- [id number]  clotrimazole (MYCELEX) 10 mg Mucous Membrane Troche, Take 1 Troche (10 mg total) by mouth Three times a day  diltiazem HCl (TIAZAC) 240 mg Oral Capsule,Sustained Action 24 hr, TAKE ONE CAPSULE BY MOUTH DAILY  flecainide (TAMBOCOR) 100 mg Oral Tablet, Take 1 Tablet (100 mg total) by mouth Twice daily  furosemide (LASIX) 40 mg Oral Tablet, Take 1 Tablet (40 mg total) by mouth Once a day  losartan (COZAAR) 50 mg Oral Tablet, Take 1 Tablet (50 mg total) by mouth Once a day  nitroGLYCERIN  (NITROSTAT) 0.4 mg Sublingual Tablet, Sublingual, Place 1 Tablet (0.4 mg total) under the tongue Every 5 minutes as needed for Chest pain  nystatin (MYCOSTATIN) 100,000 unit/mL Oral Suspension, Take 5 mL by mouth Four times a day  potassium chloride (KLOR-CON) 10 mEq Oral Tablet Sustained Release, Take 1 Tablet (10 mEq total) by mouth Once a day  rosuvastatin (CRESTOR) 10 mg Oral Tablet, Take 1 Tablet (10 mg total) by mouth Every evening  SPIRIVA WITH HANDIHALER 18 mcg Inhalation Capsule, w/Inhalation Device, Take 1 Capsule (18 mcg total) by inhalation Once a day  alfuzosin (UROXATRAL) 10 mg Oral Tablet Sustained Release 24 hr, Take 1 Tablet (10 mg total) by mouth Once a day  finasteride (PROSCAR) 5 mg Oral Tablet, TAKE 1 TABLET EVERY DAY    No facility-administered medications prior to visit.      OBJECTIVE:  Resp 18   Ht 1.803 m (5\' 11" )   Wt 117 kg (259 lb)   BMI 36.12 kg/m       General: No apparent distress, well appearing  HEENT:  NCAT, EOMI, OP clear  Neck:  supple, trachea midline  RESP:  Nonlabored breathing, no use of accessory muscles   CV: No extremity swelling  Neuro:  Neurological exam is consistent with patients age.     GU:  Phallus within normal limits, orthotopic meatus.  Scrotal skin and contents within normal limits  DRE: completed last office visit  Psych: alert, appropriate mood, and in no acute distress.      MSK: Ambulates without assistance, bilateral compression stockings in place      PSA:  05/2016 - 1.30  06/2017 - 2.60  04/2021 - 2.22  04/2022 - 1.66    TRUS Suzing  11/2022  1. Prostate gland heterogeneous echogenicity, no solid mass; a few tiny nonaggressive cysts.   2. Prostate gland not enlarged, 42.7 cm3 (25.0-55.0 cm3).        ASSESSMENT:   Encounter Diagnoses   Name Primary?    Enlarged prostate with lower urinary tract symptoms (LUTS) Yes           PLAN: 78 year old male with history of BPH w/ LUTS and hematospermia  - we discussed his AUA symptom score, and considerable  hypertrophy of the prostate seen on cystoscopy, and findings of prostate less than 50 g.  We had a long discussion regarding options of continued path of observation, alternative alpha blockers or 5 alpha reductase inhibitors, PDE5 inhibitors, and surgical options. We discussed minimally invasive therapies interested in multiple questions he had, particularly UroLift and Rezum, and also related to perioperative expectations and goals for improvement in his quality of life. Patient to call should he choose to proceed with intervention.  Otherwise plan followup in 1 year      Orders Placed This Encounter    alfuzosin (UROXATRAL) 10 mg Oral Tablet Sustained Release 24 hr    finasteride (PROSCAR) 5 mg Oral Tablet     Return in about 1 year (around 07/07/2024) for MD visit, PVR.      Nolon Nations, MD   [facility]

## 2023-07-08 NOTE — Progress Notes (Signed)
ENT, [facility]  [address]  [place] [address]  [phone]    PATIENT NAME:  Thomas [name]  MRN:  [id number]  DOB:  [birth year]  DATE OF SERVICE: 07/12/2023    Chief Complaint:  Tongue Pain (2 week recheck improved)      HPI:  Thomas [name] is a 78 y.o. male  who was last seen on 06/28/2023 for evaluation of mouth symptoms. He reports she has pain in the back of his tongue. He states it has been ongoing for 3-4 months. He reports the pain as a dull pain, overall a 3-4 on a 0-10 scale. He does note it has gotten as bad as a 6-7 in pain.  At this time bilateral cerumen impactions were removed, a flexible laryngoscopy was performed in clinic noting thrush on the base of his tongue. He was prescribed Mycelex and Nystatin for oral thrush.     Thomas [name] presents for his 2 week follow up evaluation. He reports he has been doing much better.     Review of Systems:                                                        All other systems reviewed and found to be negative.    Physical Exam:  Temperature 36.3 C (97.4 F), height 1.803 m (5\' 11" ), weight 117 kg (257 lb 3.2 oz).  Body mass index is 35.87 kg/m.  General Appearance: Pleasant, cooperative, healthy, and in no acute distress.  Eyes: Conjunctivae/corneas clear, PERRLA, EOM's intact.  Head and Face: Normocephalic, atraumatic.  Face symmetric, no obvious lesions.   Right Ear:       Pinnae: Normal shape and position.        External auditory canals: Patent without inflammation.         Tympanic membranes:  Intact, translucent, midposition, middle ear aerated.  Left Ear:       Pinnae: Normal shape and position.        External auditory canals: Patent without inflammation.         Tympanic membranes:  Intact, translucent, midposition, middle ear aerated.  Nose: External pyramid midline. Mucosa normal. No purulence, polyps, or crusts.   Oral Cavity/Oropharynx: No mucosal lesions, masses, or pharyngeal asymmetry.  Tonsils: symmetric  and minimal tissue   Nasopharynx: Deferred.  Hypopharynx/Larynx: Deferred.  Neck: no cervical adenopathy, no palpable thyroid or salivary gland masses  Thyroid: no significant thyroid abnormality by palpation  Cardiovascular: Good perfusion of upper extremities.  No cyanosis of the hands or fingers.  Lungs: No apparent stridorous breathing. No acute distress.  Skin: Skin warm and dry.  Neurologic: Cranial nerves:  grossly intact.  Psychiatric: Alert and oriented x 3.    Procedure:       Data Reviewed:  N/A    Assessment:  Oral thrush     Plan:    Patients symptoms are improving upon today's examination.    Continue Mycelex and Nystatin for oral thrush PRN  F/U PRN for reevaluation      Marcene Duos, MD  Department of Otolaryngology  [facility]    PCP: Rhoderick Moody, DO  [address]  [place] [address]   REF: Rhoderick Moody, DO  [address]  [place],  [place] [address]  I am scribing for, and in the presence of Dr. Cinda Quest for services provided on 07/12/2023.  Frederich Chick, SCRIBE   Hannah Turtzer, SCRIBE  07/12/2023, 14:45  I personally performed the services described in this documentation, as scribed  in my presence, and it is both accurate  and complete.    Marcene Duos, MD

## 2023-07-09 ENCOUNTER — Ambulatory Visit (HOSPITAL_COMMUNITY): Payer: Medicare HMO

## 2023-07-09 ENCOUNTER — Encounter (HOSPITAL_COMMUNITY): Payer: Self-pay

## 2023-07-09 ENCOUNTER — Ambulatory Visit (HOSPITAL_COMMUNITY): Admission: RE | Admit: 2023-07-09 | Payer: Medicare HMO | Source: Ambulatory Visit

## 2023-07-12 ENCOUNTER — Other Ambulatory Visit (INDEPENDENT_AMBULATORY_CARE_PROVIDER_SITE_OTHER): Payer: Self-pay | Admitting: Physician Assistant

## 2023-07-12 ENCOUNTER — Other Ambulatory Visit (INDEPENDENT_AMBULATORY_CARE_PROVIDER_SITE_OTHER): Payer: Self-pay | Admitting: Family Medicine

## 2023-07-12 ENCOUNTER — Ambulatory Visit (INDEPENDENT_AMBULATORY_CARE_PROVIDER_SITE_OTHER): Payer: Commercial Managed Care - PPO | Admitting: Otolaryngology

## 2023-07-12 ENCOUNTER — Encounter (INDEPENDENT_AMBULATORY_CARE_PROVIDER_SITE_OTHER): Payer: Self-pay | Admitting: Otolaryngology

## 2023-07-12 ENCOUNTER — Other Ambulatory Visit: Payer: Self-pay

## 2023-07-12 VITALS — Temp 97.4°F | Ht 71.0 in | Wt 257.2 lb

## 2023-07-12 DIAGNOSIS — B370 Candidal stomatitis: Secondary | ICD-10-CM

## 2023-07-12 NOTE — Telephone Encounter (Signed)
Last Visit:06/07/2023     Upcoming appointments: 07/19/2023           Joaquin Courts, MA  07/12/2023, 16:56

## 2023-07-13 ENCOUNTER — Ambulatory Visit (HOSPITAL_COMMUNITY): Payer: Self-pay

## 2023-07-13 ENCOUNTER — Other Ambulatory Visit (INDEPENDENT_AMBULATORY_CARE_PROVIDER_SITE_OTHER): Payer: Self-pay

## 2023-07-13 ENCOUNTER — Ambulatory Visit: Payer: Medicare HMO | Admitting: Internal Medicine

## 2023-07-13 VITALS — BP 156/74 | HR 71 | Temp 98.0°F | Wt 221.0 lb

## 2023-07-13 DIAGNOSIS — N179 Acute kidney failure, unspecified: Secondary | ICD-10-CM | POA: Diagnosis not present

## 2023-07-13 DIAGNOSIS — R531 Weakness: Secondary | ICD-10-CM

## 2023-07-13 DIAGNOSIS — N1832 Chronic kidney disease, stage 3b: Secondary | ICD-10-CM

## 2023-07-13 DIAGNOSIS — I5022 Chronic systolic (congestive) heart failure: Secondary | ICD-10-CM

## 2023-07-13 DIAGNOSIS — T8453XD Infection and inflammatory reaction due to internal right knee prosthesis, subsequent encounter: Secondary | ICD-10-CM

## 2023-07-13 DIAGNOSIS — D649 Anemia, unspecified: Secondary | ICD-10-CM | POA: Diagnosis not present

## 2023-07-13 DIAGNOSIS — Z7984 Long term (current) use of oral hypoglycemic drugs: Secondary | ICD-10-CM

## 2023-07-13 DIAGNOSIS — N2889 Other specified disorders of kidney and ureter: Secondary | ICD-10-CM

## 2023-07-13 DIAGNOSIS — E1143 Type 2 diabetes mellitus with diabetic autonomic (poly)neuropathy: Secondary | ICD-10-CM

## 2023-07-13 DIAGNOSIS — Z7189 Other specified counseling: Secondary | ICD-10-CM

## 2023-07-13 DIAGNOSIS — F338 Other recurrent depressive disorders: Secondary | ICD-10-CM

## 2023-07-13 LAB — RENAL FUNCTION PANEL
Albumin: 3.6 g/dL (ref 3.5–5.2)
BUN: 22 mg/dL (ref 6–23)
CO2: 29 meq/L (ref 19–32)
Calcium: 8.9 mg/dL (ref 8.4–10.5)
Chloride: 105 meq/L (ref 96–112)
Creatinine, Ser: 1.7 mg/dL — ABNORMAL HIGH (ref 0.40–1.50)
GFR: 38.08 mL/min — ABNORMAL LOW (ref >60.00)
Glucose, Bld: 67 mg/dL — ABNORMAL LOW (ref 70–99)
Phosphorus: 3.5 mg/dL (ref 2.3–4.6)
Potassium: 4.1 meq/L (ref 3.5–5.1)
Sodium: 140 meq/L (ref 135–145)

## 2023-07-13 LAB — HEPATIC FUNCTION PANEL
ALT: 12 U/L (ref 0–53)
AST: 16 U/L (ref 0–37)
Albumin: 3.6 g/dL (ref 3.5–5.2)
Alkaline Phosphatase: 96 U/L (ref 39–117)
Bilirubin, Direct: 0.1 mg/dL (ref 0.0–0.3)
Total Bilirubin: 0.4 mg/dL (ref 0.2–1.2)
Total Protein: 6 g/dL (ref 6.0–8.3)

## 2023-07-13 MED ORDER — DILTIAZEM ER 240 MG CAPSULE,24 HR,EXTENDED RELEASE
240.0000 mg | ORAL_CAPSULE | Freq: Every day | ORAL | 1 refills | Status: DC
Start: 2023-07-13 — End: 2023-09-02

## 2023-07-13 NOTE — Patient Instructions (Signed)
WE will check the labs today.

## 2023-07-13 NOTE — Progress Notes (Signed)
   Subjective:   Patient ID: Tanner [name], male    DOB: [birth year], 78 y.o.   MRN: [id number]  HPI The patient is a 78 YO man coming in for nursing home follow up (in hospital for pneumonia back in nov 2024 and did rehab, was having some swallowing issues in hospital and was on thickened liquids for some time, reviewed discharge documents). He has been using normal liquids since at nursing home and at home. Having speech and pt/ot to house. Voice is finally starting to get back to normal. He is taking his medications as per list. Some weakness but improving. Wants to know if he needs oxygen still.   PMH, [facility], social history reviewed and updated  Review of Systems  Constitutional:  Positive for activity change, appetite change and fatigue.  HENT: Negative.    Eyes: Negative.   Respiratory:  Positive for cough and shortness of breath. Negative for chest tightness.   Cardiovascular:  Negative for chest pain, palpitations and leg swelling.  Gastrointestinal:  Negative for abdominal distention, abdominal pain, constipation, diarrhea, nausea and vomiting.  Musculoskeletal:  Positive for arthralgias and gait problem.  Skin: Negative.   Neurological:  Positive for weakness.  Psychiatric/Behavioral: Negative.      Objective:  Physical Exam Constitutional:      Appearance: He is well-developed.  HENT:     Head: Normocephalic and atraumatic.  Cardiovascular:     Rate and Rhythm: Normal rate and regular rhythm.  Pulmonary:     Effort: Pulmonary effort is normal. No respiratory distress.     Breath sounds: Normal breath sounds. No wheezing or rales.     Comments: No oxygen in place during visit Abdominal:     General: Bowel sounds are normal. There is no distension.     Palpations: Abdomen is soft.     Tenderness: There is no abdominal tenderness. There is no rebound.  Musculoskeletal:     Cervical back: Normal range of motion.  Skin:    General: Skin is warm and dry.  Neurological:      Mental Status: He is alert and oriented to person, place, and time.     Coordination: Coordination abnormal.     Vitals:   07/13/23 1403 07/13/23 1406  BP: (!) 156/74 (!) 156/74  Pulse: 71   Temp: 98 F (36.7 C)   TempSrc: Oral   SpO2: 92%   Weight: 221 lb (100.2 kg)     Assessment & Plan:  Visit time 25 minutes in face to face communication with patient and coordination of care, additional 15 minutes spent in record review, coordination or care, ordering tests, communicating/referring to other healthcare professionals, documenting in medical records all on the same day of the visit for total time 40 minutes spent on the visit.

## 2023-07-13 NOTE — Telephone Encounter (Signed)
Regarding: Refill Request  ----- Message from Ramona P sent at 07/13/2023 11:45 AM EST -----  Copied From CRM [phone].Amato, BILL E called to request a prescription refill.     --Please send to the pharmacy listed below---    Patients last appointment: 12.23.24    Patients upcoming appointment:  2.3.25      ------diltiazem HCl (TIAZAC) 240 mg Oral Capsule,Sustained Action 24 hr, TAKE ONE CAPSULE BY MOUTH DAILY    -    [facility] - [place], [place] - [address]    [address] [place] [address]    Phone: [phone] Fax: [phone]    Hours: Not open 24 hours   -

## 2023-07-13 NOTE — Nursing Note (Signed)
POPULATION HEALTH    DISEASE MANAGEMENT COORDINATOR    [facility] called patient to check on  his shoulder chest pain, patient states its better,he did state he has an appointment and will ask PCP about testing etc. [facility] confirmed appointment time for 4:30pm. [facility] will continue to follow patient.       Alden Benjamin, RN, BSN  Disease Management Coordinator  [employer]  [phone]

## 2023-07-13 NOTE — Nursing Note (Signed)
POPULATION HEALTH  Behavioral Health Integration - Psychiatric Case Review     Behavioral Health Integration - Psychiatric Collaborative Care   Behavioral Health Integration - BHI  Status: Enrolled  Effective Dates: 04/14/2023 - present  Responsible Staff: Tasia Catchings, MSW      Behavioral Health Integration Care Team:  [id number]:[id number] [place], DO  Behavioral Health Case Manager Tasia Catchings, MSW  Collaborating Psychiatrist: Doristine Bosworth, MD     ASSESSMENT/ PLAN: (Actions for PCP to Take)      No change for PCP at this time.   Recommendations for PCP regarding pharmacologic management: See Dr. Hyacinth Meeker note for details. We recommend changes (if appropriate) be made within 5 business days for continuation of care for patient.     Recommendations for Case Manager (additional assessments or interventions  or linkages/referrals to consider):  MSW discussed with Dr. Hyacinth Meeker, he did speak with patient directly, and light therapy directions were given. MSW to continue with emotional support, linkage with resources as needed.         SUBJECTIVE/ OBJECTIVE:  Thomas [name]'s case was reviewed by collaborating psychiatrist and BH case manager today in a telephone-video conference call.  The collaborating psychiatrist was provided a summary of the patient's behavioral health by the case manager including results of validated measures compared to baseline. The psychiatrist reviewed health history and case manager notes in the medical record.     Diagnosis Review:   No suggestions/revisions to diagnosis made at this time.      Program Progression Review:   BHI Program Proression Review: Continue Enrollment within Behavioral Health Integration      Summary of Past Behavioral Health History:  Past Behavioral Health History:     Current Behavioral Health History:  Behavioral Health Care Management - Encounter Level:   Psychiatric Medication Assessment:          PHQ 9 Follow Up Questionnaire:            04/14/2023    10:00  AM 05/24/2023    11:00 AM 06/07/2023    11:03 AM   Most Recent PHQ-9 Scores   PHQ 9 Total 5 5 8            06/07/2023    11:03 AM   Most Recent PHQ-9 Scores   PHQ 9 Total 8     Little interest or pleasure in doing things.: 2 (pt is concerned for SAD.)  Feeling down, depressed, or hopeless: 2 (pt is concerned for SAD.)  PHQ 2 Total: 4  Trouble falling or staying asleep, or sleeping too much.: 1 (sleep patterns are all over the place)  Feeling tired or having little energy: 1  Poor appetite or overeating: 2  Feeling bad about yourself/ that you are a failure in the past 2 weeks?: 0  Trouble concentrating on things in the past 2 weeks?: 0  Moving/Speaking slowly or being fidgety or restless  in the past 2 weeks?: 0  Thoughts that you would be better off DEAD, or of hurting yourself in some way.: 0  If you checked off any problems, how difficult have these problems made it for you to do your work, take care of things at home, or get along with other people?: Not difficult at all  PHQ 9 Total: 8  Interpretation of Total Score: Mild depression    Last Gad-7 Scale:            04/14/2023    10:00 AM   Most Recent GAD-7  Scores   Gad-7 Score Total 4          Patient Goals:    Goals Addressed    None          Total BHI time spent this encounter is 2 minutes.     Total BHI time spent this month is 37 minutes.       Tasia Catchings, MSW  07/13/2023, 09:48  I have read and reviewed the social worker's note. I agree with the current plan of care.    Doristine Bosworth, MD

## 2023-07-14 ENCOUNTER — Telehealth (HOSPITAL_COMMUNITY): Payer: Self-pay | Admitting: Family Medicine

## 2023-07-14 ENCOUNTER — Encounter: Payer: Self-pay | Admitting: Internal Medicine

## 2023-07-14 ENCOUNTER — Ambulatory Visit: Payer: Self-pay

## 2023-07-14 DIAGNOSIS — I5022 Chronic systolic (congestive) heart failure: Secondary | ICD-10-CM

## 2023-07-14 DIAGNOSIS — N1831 Chronic kidney disease, stage 3a: Secondary | ICD-10-CM

## 2023-07-14 LAB — CBC
HCT: 33 % — ABNORMAL LOW (ref 39.0–52.0)
Hemoglobin: 10.3 g/dL — ABNORMAL LOW (ref 13.0–17.0)
MCHC: 31.1 g/dL (ref 30.0–36.0)
MCV: 78.8 fL (ref 78.0–100.0)
Platelets: 245 10*3/uL (ref 150.0–400.0)
RBC: 4.19 Mil/uL — ABNORMAL LOW (ref 4.22–5.81)
RDW: 19.6 % — ABNORMAL HIGH (ref 11.5–15.5)
WBC: 8.6 10*3/uL (ref 4.0–10.5)

## 2023-07-14 NOTE — Telephone Encounter (Signed)
Contacted pt by phone today to inquire the No Show OP MBS on 07/09/23; pt did not have a reason. When asked if he wanted to reschedule, the pt declined. It was a paper order from [facility], SLP) - attempted to reach Drenda Freeze with the outcome, left message for her with the office staff who answered the call. AHARRIS

## 2023-07-14 NOTE — Patient Outreach (Signed)
  Care Coordination   Initial Visit Note   07/14/2023 Name: Tanner [name] MRN: [id number] DOB: 04-28-1946  Tanner [name] is a 78 y.o. year old male who sees Myrlene Broker, MD for primary care. I spoke with  Tanner [name] by phone today.  What matters to the patients health and wellness today?  Mr. Zink reports he is doing good. Lives alone, but has family support. Multiple hospitalizations Right knee surgery July 2024, 03/18/23-03/26/23 aspiration pneumonia; 04/06/23-04/14/23 infection prosthetic right knee joint; 04/24/23-05/05/23 acute multifocal pneumonia/sepsis. Patient has been in skilled care since October 2024. He states he has his medications. However, medication list in chart was not complete with what patient is taking. In addition discrepancy noted with Neurontin. Patient states he is taking as prescribed by the [facility] and what patient states he is taking. He is receptive to referral to clinical pharmacy for medications reconciliation. He is without questions at this time. Active with [facility] home health.  Goals Addressed             This Visit's Progress    Assistance with Health Management       Interventions Today    Flowsheet Row Most Recent Value  Chronic Disease   Chronic disease during today's visit Diabetes, Other, Congestive Heart Failure (CHF)  General Interventions   General Interventions Discussed/Reviewed General Interventions Reviewed, Doctor Visits  Doctor Visits Discussed/Reviewed Doctor Visits Discussed, PCP, Specialist  Durable Medical Equipment (DME) BP Cuff  PCP/Specialist Visits Compliance with follow-up visit  [last visit with PCP 07/13/23. [facility]. per patient, he has a telephone visit with provider at [place]. RNCM encouraged patient to schedule follow up appointment with cardiologist (last seen March 2024).]  Education Interventions   Education Provided Provided Education  Provided Verbal Education  On Medication, Exercise, When to see the doctor  [advised to take medications as prescribed, attend provider visit as recommended, discussed importance of taking medications as prescribed. [facility].]  Pharmacy Interventions   Pharmacy Dicussed/Reviewed Pharmacy Topics Discussed, Referral to Pharmacist, Medication Adherence  [medication review completed. Medications listed in chart were listed from SNF. RNCM added medications to the list that patient states he is taking. Patient also received medications from the [place]. Referral to clinical pharmacist.]  Medication Adherence Not taking medication  [referral for medication reconciliation to assist with adherence and accuracy of medication list. patient has been in skilled care approximately 4 months, discharged from hospital on __ in addition to [facility]  Safety Interventions   Safety Discussed/Reviewed Safety Discussed, Fall Risk, Home Safety  Home Safety Assistive Devices  [confirmed active with home health, [facility]]            SDOH assessments and interventions completed:  No  Care Coordination Interventions:  Yes, provided   Follow up plan: Follow up call scheduled for 08/09/22    Encounter Outcome:  Patient Visit Completed   Kathyrn Sheriff, RN, MSN, BSN, CCM [place]  [facility], Population Health Case Manager Phone: [phone]

## 2023-07-15 ENCOUNTER — Ambulatory Visit (HOSPITAL_COMMUNITY): Payer: Self-pay

## 2023-07-15 DIAGNOSIS — F338 Other recurrent depressive disorders: Secondary | ICD-10-CM

## 2023-07-15 NOTE — Nursing Note (Signed)
POPULATION HEALTH  Behavioral Health Integration - Psychiatric Collaborative Care - Follow Up    BHI Enrollment Date Patient was enrolled in BHI on 04/14/2023  9:51 AM.    PCP Rhoderick Moody, DO   Behavioral Health Case Manager Tasia Catchings, MSW  Collaborating Psychiatrist Doristine Bosworth, MD     ASSESSMENT/ PLAN:       MSW spoke with patient via telephone this date. PHQ9 completed. Patient reports he has been feeling down. He was interested in exploring a relationship with a woman, and she declined interest. MSW offered emotional support. Patient has not yet received call back from [place] [facility] for therapy services. MSW did contact and left message to inquire about wait time estimate.  MSW will follow and encouraged ongoing communication of needs.     Brief Intervention(s):   Emotional Support  Follow with regular PHQ-9     Patient Goals:    Goals Addressed                   This Visit's Progress     Anxiety Symptoms Monitored and Managed   On track     Evidence-based guidance:   Explore treatment options in a youth-friendly atmosphere of hope and optimism [e.g., cognitive behavioral therapy, pharmacologic therapy, adjunct therapy (applied relaxation, mindfulness, meditation)].   Encourage ?obooster? sessions of cognitive behavioral therapy to prevent relapse that may occur 6 to 24 months after treatment has concluded.   Prepare child and caregiver for pharmacologic therapy (e.g., benzodiazepine) as short-term treatment to alleviate severe symptoms while allotting time for anxiolytic medication to become therapeutic, as well as allow for engagement    in treatment.   Prepare patient for short-term pharmacologic therapy when symptoms become debilitating (e.g., selective serotonin-reuptake inhibitor, serotonin-norepinephrine-reuptake inhibitor, tricyclic antidepressant).   Anticipate referral to behavioral health specialist or psychiatry when first-line treatments are not effective, anxiety is  complicated by alcohol or substance use, symptoms interfere with social functioning, secondary depression or    suicidality occurs.   Consider parent-based training when child participation in cognitive behavioral therapy is not possible or child is not responding to treatment.   Address barriers to treatment that may include long wait times, delays in scheduling appointments, lack of pediatric mental health providers, transportation, inadequate or no insurance or stigma associated with mental health    disorders and treatment.    Notes:                 SUBJECTIVE:    Current Behavioral Health History:  Behavioral Health Care Management - Encounter Level:   Current Symptoms: depressed mood  Recent Suicidal Thoughts?: NONE  Recent Homicidal Thoughts?: denies homicidal ideation  Psychiatric Medication Assessment:  Medication(s) seem effective?: too soon to tell  Medication Adverse Effects: No  Brief Interventions Provided this Encounter: Emotional Support  Safety risk related to mental health?: Neg          OBJECTIVE:  Validated Measures:       05/24/2023    11:00 AM 06/07/2023    11:03 AM 07/15/2023    10:00 AM   Most Recent PHQ-9 Scores   PHQ 9 Total 5 8 5        Little interest or pleasure in doing things.: 1  Feeling down, depressed, or hopeless: 1  PHQ 2 Total: 2  Trouble falling or staying asleep, or sleeping too much.: 0  Feeling tired or having little energy: 1  Poor appetite or overeating: 1  Feeling bad about yourself/ that  you are a failure in the past 2 weeks?: 0  Trouble concentrating on things in the past 2 weeks?: 1  Moving/Speaking slowly or being fidgety or restless  in the past 2 weeks?: 0  Thoughts that you would be better off DEAD, or of hurting yourself in some way.: 0  If you checked off any problems, how difficult have these problems made it for you to do your work, take care of things at home, or get along with other people?: Not difficult at all  PHQ 9 Total: 5  Interpretation of Total Score:  Mild depression          Ongoing and Updated Assessment Interventions:  PHQ9     Case Manager To Do List for the Next Interaction:   Routine follow up       Plan to call patient in ~ one month to reassess and update plan of care.  Instructed patient to call with change in symptoms or as needed prior to next follow up.       Tasia Catchings, MSW  07/15/2023, 10:35  Behavioral Health Case Manager

## 2023-07-16 ENCOUNTER — Encounter: Payer: Self-pay | Admitting: Internal Medicine

## 2023-07-16 ENCOUNTER — Ambulatory Visit (INDEPENDENT_AMBULATORY_CARE_PROVIDER_SITE_OTHER): Payer: Self-pay | Admitting: Family Medicine

## 2023-07-16 ENCOUNTER — Other Ambulatory Visit (INDEPENDENT_AMBULATORY_CARE_PROVIDER_SITE_OTHER): Payer: Self-pay

## 2023-07-16 DIAGNOSIS — Z7189 Other specified counseling: Secondary | ICD-10-CM

## 2023-07-16 DIAGNOSIS — I10 Essential (primary) hypertension: Secondary | ICD-10-CM

## 2023-07-16 DIAGNOSIS — F338 Other recurrent depressive disorders: Secondary | ICD-10-CM

## 2023-07-16 DIAGNOSIS — E782 Mixed hyperlipidemia: Secondary | ICD-10-CM

## 2023-07-16 NOTE — Assessment & Plan Note (Signed)
BMI 37 and complicated by heart failure and diabetes. Counseled about diet.

## 2023-07-16 NOTE — Assessment & Plan Note (Signed)
Recent HgA1c in last 6 months not in diabetic range. Taking jardiance more for heart failure. Recommend at least every 6 months Hga1c.

## 2023-07-16 NOTE — Assessment & Plan Note (Signed)
Checking CMP for stability and to see if AKI is fully resolved.

## 2023-07-16 NOTE — Assessment & Plan Note (Signed)
No flare today and is taking coreg and jardiance and torsemide 20 mg daily. Continue if labs stable checking CBC and CMP.

## 2023-07-16 NOTE — Assessment & Plan Note (Signed)
Checking CBC and adjust as needed. Not known if any labs at SNF no records available. No clinical signs of bleeding.

## 2023-07-16 NOTE — Assessment & Plan Note (Signed)
Unclear if repeat labs since leaving hospital as no records to review. Checking CMP and adjust as needed.

## 2023-07-16 NOTE — Assessment & Plan Note (Signed)
Checking CT with and without contrast as he cannot do MRI. We discussed that this could be a cancerous mass and this would help [name] characterize this. He was aware of cysts only and reviewed he does have cysts also which do not need follow up but this is a separate area.

## 2023-07-16 NOTE — Assessment & Plan Note (Signed)
Seeing ortho and no pain in the knee currently.

## 2023-07-16 NOTE — Assessment & Plan Note (Signed)
Working with Pt/Ot and speech at home.

## 2023-07-16 NOTE — Progress Notes (Signed)
Behavioral Health Integration --Last encounter this month  Behavioral Health Integration - BHI  Status: Enrolled  Effective Dates: 04/14/2023 - present  Responsible Staff: Tasia Catchings, MSW      BHI Care Team:   Primary Care Provider Rhoderick Moody, DO  Behavioral Health Case Manager Tasia Catchings, MSW  Collaborating Psychiatrist Doristine Bosworth, MD    Behavioral Health Integration Psychiatric Collaborative Care services have been performed by behavioral health case manager during this calendar month under the supervision of primary care provider Rhoderick Moody, DO for behavioral health condition(s):  (F33.8) Seasonal depression (CMS [facility])  (primary encounter diagnosis)      The patient hasbeen added to a patient roster for weekly case load review with collaborating psychiatrist.  [name] treatment plan and status has been reviewed in weekly case conference with collaborating psychiatrist.    Brief interventions provided by case manager during this calendar month: communication skills, emotional support, and mood regulation    Validated Measures used to monitor patient progress during this calendar month:      05/24/2023    11:00 AM 06/07/2023    11:03 AM 07/15/2023    10:00 AM   Most Recent PHQ-9 Scores   PHQ 9 Total 5 8 5            04/14/2023    10:00 AM   Most Recent GAD-7 Scores   Gad-7 Score Total 4           Total BHI time spent this month is 49 minutes.     Tasia Catchings, MSW  07/16/2023, 12:28

## 2023-07-16 NOTE — Progress Notes (Signed)
Dr. Rhoderick Moody, DO  ,    This patient has met the requirements to bill for COMPLEX Chronic Care Management this month.       Complex CCM attestation    I supervised and collaborated with the nurse providing chronic care management services for Thomas [name] who met moderate-high complexity medical decision-making due to modifications of the care plan or treatment plan/medications during this calendar month.    Rhoderick Moody, DO   Chronic Care Management Time Documentation on 07/16/2023 10:16.   Time spent during current encounter is 1 minutes.   Cumulative time during current month's episode (month-to-date) is 91 minutes.          ICD-10-CM    1. Encounter for counseling for care management of patient with chronic conditions and complex health needs using nurse-based model  Z71.89       2. Essential hypertension  I10       3. Mixed hyperlipidemia  E78.2             acetaminophen (TYLENOL) 500 mg Oral Tablet, Take 1 Tablet (500 mg total) by mouth Every 4 hours as needed for Pain (pt takes tylenol pm)  alfuzosin (UROXATRAL) 10 mg Oral Tablet Sustained Release 24 hr, Take 1 Tablet (10 mg total) by mouth Once a day  apixaban (ELIQUIS) 5 mg Oral Tablet, Take 1 Tablet (5 mg total) by mouth Twice daily SHIP TO PATIENT,   [id number]  STATE LICENSE- [id number]  NPI- [id number]  clotrimazole (MYCELEX) 10 mg Mucous Membrane Troche, Take 1 Troche (10 mg total) by mouth Three times a day  diltiazem HCl (TIAZAC) 240 mg Oral Capsule,Sustained Action 24 hr, Take 1 Capsule (240 mg total) by mouth Once a day  finasteride (PROSCAR) 5 mg Oral Tablet, Take 1 Tablet (5 mg total) by mouth Once a day  flecainide (TAMBOCOR) 100 mg Oral Tablet, Take 1 Tablet (100 mg total) by mouth Twice daily  furosemide (LASIX) 40 mg Oral Tablet, Take 1 Tablet (40 mg total) by mouth Once a day  losartan (COZAAR) 50 mg Oral Tablet, Take 1 Tablet (50 mg total) by mouth Once a day  nitroGLYCERIN (NITROSTAT) 0.4 mg Sublingual Tablet, Sublingual, Place 1  Tablet (0.4 mg total) under the tongue Every 5 minutes as needed for Chest pain  nystatin (MYCOSTATIN) 100,000 unit/mL Oral Suspension, Take 5 mL by mouth Four times a day  potassium chloride (KLOR-CON) 10 mEq Oral Tablet Sustained Release, Take 1 Tablet (10 mEq total) by mouth Once a day  rosuvastatin (CRESTOR) 10 mg Oral Tablet, Take 1 Tablet (10 mg total) by mouth Every evening  SPIRIVA WITH HANDIHALER 18 mcg Inhalation Capsule, w/Inhalation Device, Take 1 Capsule (18 mcg total) by inhalation Once a day    No facility-administered medications prior to visit.      Thanks,  Your Chronic Care Management Team

## 2023-07-19 ENCOUNTER — Other Ambulatory Visit: Payer: Self-pay

## 2023-07-19 ENCOUNTER — Ambulatory Visit (INDEPENDENT_AMBULATORY_CARE_PROVIDER_SITE_OTHER): Payer: Commercial Managed Care - PPO | Admitting: Family Medicine

## 2023-07-19 ENCOUNTER — Ambulatory Visit (HOSPITAL_COMMUNITY)
Admission: RE | Admit: 2023-07-19 | Discharge: 2023-07-19 | Disposition: A | Discharge status: Home / Self Care | Payer: Commercial Managed Care - PPO | Source: Ambulatory Visit | Attending: Family Medicine

## 2023-07-19 ENCOUNTER — Inpatient Hospital Stay (HOSPITAL_BASED_OUTPATIENT_CLINIC_OR_DEPARTMENT_OTHER)
Admission: RE | Admit: 2023-07-19 | Discharge: 2023-07-19 | Disposition: A | Discharge status: Home / Self Care | Payer: Commercial Managed Care - PPO | Source: Ambulatory Visit | Attending: Family Medicine

## 2023-07-19 ENCOUNTER — Encounter (INDEPENDENT_AMBULATORY_CARE_PROVIDER_SITE_OTHER): Payer: Self-pay | Admitting: Family Medicine

## 2023-07-19 ENCOUNTER — Ambulatory Visit
Admission: RE | Admit: 2023-07-19 | Discharge: 2023-07-19 | Disposition: A | Discharge status: Home / Self Care | Payer: Commercial Managed Care - PPO | Source: Ambulatory Visit | Attending: Family Medicine | Admitting: Family Medicine

## 2023-07-19 VITALS — BP 138/72 | HR 67 | Ht 70.98 in | Wt 254.0 lb

## 2023-07-19 DIAGNOSIS — M25512 Pain in left shoulder: Secondary | ICD-10-CM

## 2023-07-19 DIAGNOSIS — M898X1 Other specified disorders of bone, shoulder: Secondary | ICD-10-CM | POA: Insufficient documentation

## 2023-07-19 DIAGNOSIS — M19012 Primary osteoarthritis, left shoulder: Secondary | ICD-10-CM

## 2023-07-19 NOTE — Nursing Note (Signed)
Pt presents in office for L shoulder pain. It happened 65mo ago was muscular and now Ferin the nurse said he should get it checked out.

## 2023-07-19 NOTE — Progress Notes (Signed)
PRIMARY CARE, [facility]  [address] [place] [address]    Progress Note    Encounter Date: 07/19/2023    Patient ID:  Thomas [name]  VZD:[id number]    DOB: [birth year]  Age: 78 y.o. male  Subjective   Subjective:     Chief Complaint   Patient presents with    Shoulder Pain     The history is provided by the patient.   Arm Pain   This is a new problem. Episode onset: about a month ago, left scapular pain, posterior shoulder. The problem has been waxing and waning (Sometimes it is worse than others and he does not think it is because of any specific reason.). Pertinent negatives include no joint locking, joint swelling, limited range of motion, numbness, stiffness or tingling. Associated symptoms comments: Nothing makes it worse or better.  He has tried taking Tylenol and moist heat. The treatment provided no relief.     Current Outpatient Medications   Medication Sig    acetaminophen (TYLENOL) 500 mg Oral Tablet Take 1 Tablet (500 mg total) by mouth Every 4 hours as needed for Pain (pt takes tylenol pm)    alfuzosin (UROXATRAL) 10 mg Oral Tablet Sustained Release 24 hr Take 1 Tablet (10 mg total) by mouth Once a day    apixaban (ELIQUIS) 5 mg Oral Tablet Take 1 Tablet (5 mg total) by mouth Twice daily SHIP TO PATIENT,   PIR-[id number]  STATE LICENSE- [id number]  NPI- [id number]    clotrimazole (MYCELEX) 10 mg Mucous Membrane Troche Take 1 Troche (10 mg total) by mouth Three times a day    diltiazem HCl (TIAZAC) 240 mg Oral Capsule,Sustained Action 24 hr Take 1 Capsule (240 mg total) by mouth Once a day    finasteride (PROSCAR) 5 mg Oral Tablet Take 1 Tablet (5 mg total) by mouth Once a day    flecainide (TAMBOCOR) 100 mg Oral Tablet Take 1 Tablet (100 mg total) by mouth Twice daily    furosemide (LASIX) 40 mg Oral Tablet Take 1 Tablet (40 mg total) by mouth Once a day    losartan (COZAAR) 50 mg Oral Tablet Take 1 Tablet (50 mg total) by mouth Once a day    nitroGLYCERIN (NITROSTAT)  0.4 mg Sublingual Tablet, Sublingual Place 1 Tablet (0.4 mg total) under the tongue Every 5 minutes as needed for Chest pain    nystatin (MYCOSTATIN) 100,000 unit/mL Oral Suspension Take 5 mL by mouth Four times a day    potassium chloride (KLOR-CON) 10 mEq Oral Tablet Sustained Release Take 1 Tablet (10 mEq total) by mouth Once a day    rosuvastatin (CRESTOR) 10 mg Oral Tablet Take 1 Tablet (10 mg total) by mouth Every evening    SPIRIVA WITH HANDIHALER 18 mcg Inhalation Capsule, w/Inhalation Device Take 1 Capsule (18 mcg total) by inhalation Once a day     No Known Allergies  Past Medical History:   Diagnosis Date    Arthritis     Atrial flutter (CMS HCC)     Cancer (CMS HCC)     Dysuria     Enteritis due to Rotavirus     Gout     Headache     Hemorrhoid     Hypercholesterolemia     Hypertension          Past Surgical History:   Procedure Laterality Date    HX HEMORRHOIDECTOMY           Family Medical  History:       Problem Relation (Age of Onset)    Cancer Mother    Hypertension (High Blood Pressure) Mother, Father            Social History     Tobacco Use    Smoking status: Former     Types: Cigars    Smokeless tobacco: Current     Types: Chew, Snuff   Vaping Use    Vaping status: Never Used   Substance Use Topics    Alcohol use: Yes     Alcohol/week: 2.0 standard drinks of alcohol     Types: 2 Cans of beer per week     Comment: few days a week    Drug use: Never       Review of Systems   Constitutional:  Negative for activity change and unexpected weight change.   Respiratory:  Negative for cough, shortness of breath and wheezing.    Cardiovascular:  Negative for chest pain and palpitations.   Gastrointestinal:  Negative for diarrhea, nausea and vomiting.   Musculoskeletal:  Positive for back pain. Negative for myalgias, neck pain and stiffness.   Skin:  Negative for rash.   Neurological:  Negative for tingling, weakness and numbness.        Objective   Objective:   Vitals: BP 138/72   Pulse 67   Ht 1.803 m  (5' 10.98")   Wt 115 kg (254 lb)   SpO2 98%   BMI 35.44 kg/m         Physical Exam  Constitutional:       General: He is not in acute distress.     Appearance: Normal appearance. He is not ill-appearing.   Cardiovascular:      Rate and Rhythm: Normal rate and regular rhythm.      Heart sounds: No murmur heard.  Pulmonary:      Effort: Pulmonary effort is normal.      Breath sounds: Normal breath sounds. No wheezing.   Musculoskeletal:      Comments: He does have some tenderness to palpation in the left lower scapular area.  No decreased range of motion in the shoulder   Lymphadenopathy:      Cervical: No cervical adenopathy.   Skin:     General: Skin is warm and dry.      Findings: No rash.   Neurological:      General: No focal deficit present.      Mental Status: He is alert.      Motor: No weakness.      Coordination: Coordination normal.      Gait: Gait normal.      Deep Tendon Reflexes: Reflexes normal.          Assessment & Plan:     ENCOUNTER DIAGNOSES     ICD-10-CM   1. Acute pain of left shoulder  M25.512   2. Pain of left scapula  M89.8X1     Will start with plain films of bath the shoulder and scapular area and also refer him physical therapy.  Further recommendations and most likely orthopedic referral if he is not getting any better.  Continue Tylenol and heat no anti-inflammatories since he is on Eliquis.  Orders Placed This Encounter    XR AP SHOULDER LEFT [IMG1906]    XR SCAPULA LEFT [IMG79]    XR SCAPULA LEFT [IMG79]    PT Evaluate and Treat       Return if symptoms worsen  or fail to improve.    Rhoderick Moody, DO

## 2023-07-20 ENCOUNTER — Encounter: Payer: Self-pay | Admitting: Internal Medicine

## 2023-07-21 ENCOUNTER — Telehealth (INDEPENDENT_AMBULATORY_CARE_PROVIDER_SITE_OTHER): Payer: Self-pay | Admitting: Family Medicine

## 2023-07-21 ENCOUNTER — Telehealth: Payer: Self-pay

## 2023-07-21 NOTE — Progress Notes (Signed)
 Care Guide Pharmacy Note  07/21/2023 Name: [name] MRN: [id number] DOB: 06-Jul-1945  Referred By: Rollene Almarie LABOR, MD Reason for referral: Care Coordination (Outreach to schedule with Pharm d )   Tanner [name] is a 78 y.o. year old male who is a primary care patient of Rollene Almarie LABOR, MD.  [name] was referred to the pharmacist for assistance related to: CHF and CKD Stage 3  Successful contact was made with the patient to discuss pharmacy services including being ready for the pharmacist to call at least 5 minutes before the scheduled appointment time and to have medication bottles and any blood pressure readings ready for review. The patient agreed to meet with the pharmacist via telephone visit on (date/time).08/05/2023  Jeoffrey Buffalo , RMA     [place]  [place] [facility] Guide  Direct Dial: [phone]  Website: [email or web address]

## 2023-07-21 NOTE — Telephone Encounter (Signed)
-----   Message from Rhoderick Moody, DO sent at 07/20/2023  5:02 PM EST -----  Let the patient know that his scapula x-ray was negative, shoulder x-ray does show some degenerative changes but otherwise normal.  I would recommend referral to physical therapy for evaluation and treatment of this pain

## 2023-07-21 NOTE — Telephone Encounter (Signed)
Pt notified and voiced understanding. Pt states he is scheduled for pt next Thursday with Sunday Corn

## 2023-07-23 ENCOUNTER — Ambulatory Visit (INDEPENDENT_AMBULATORY_CARE_PROVIDER_SITE_OTHER): Payer: Self-pay | Admitting: Family Medicine

## 2023-07-23 NOTE — Telephone Encounter (Signed)
Regarding: Clinical Question  ----- Message from Adelene Amas sent at 07/23/2023  2:42 PM EST -----  Copied From CRM [id number].  [name], Thomas E called with a clinical question. Patient calling in requesting a call back to discuss an referral to an external provider. Patient thinks this may be about a podiatrist. Patient requesting call back. Please advise. Thank you.

## 2023-07-23 NOTE — Telephone Encounter (Signed)
Spoke to pt, this was in regards to referral from December, I did look into this and info was faxed, I did give pot their number to schedule

## 2023-07-26 ENCOUNTER — Ambulatory Visit (HOSPITAL_COMMUNITY)
Admission: RE | Admit: 2023-07-26 | Discharge: 2023-07-26 | Disposition: A | Discharge status: Still In Hospital | Payer: Commercial Managed Care - PPO | Source: Ambulatory Visit | Attending: Family Medicine | Admitting: Family Medicine

## 2023-07-26 ENCOUNTER — Other Ambulatory Visit: Payer: Self-pay

## 2023-07-26 ENCOUNTER — Encounter (HOSPITAL_COMMUNITY): Payer: Self-pay

## 2023-07-26 DIAGNOSIS — M25512 Pain in left shoulder: Secondary | ICD-10-CM | POA: Insufficient documentation

## 2023-07-26 DIAGNOSIS — M898X1 Other specified disorders of bone, shoulder: Secondary | ICD-10-CM | POA: Insufficient documentation

## 2023-07-26 NOTE — PT Evaluation (Signed)
[address], [place], [place], [address]  (Office252 [phone]   (Fax) [phone]   Outpatient Rehabilitation Services  Physical Therapy Evaluation     Date: 07/26/2023  Patient Name: Thomas [name]  Date of Birth: [birth year]  MRN: [id number]  Payor: Payor: HUMANA MEDICARE / Plan: Francine Graven CHOICE PPO / Product Type: PPO /   Referring Physician: Rhoderick Moody, DO   Next Physician Follow-up Visit: TBD  Diagnosis:   Acute pain of left shoulder [M25.512]  - Primary      Pain of left scapula [M89.8X1]        PMH:   Past Medical History:   Diagnosis Date    Arthritis     Atrial flutter (CMS HCC)     Cancer (CMS HCC)     Dysuria     Enteritis due to Rotavirus     Gout     Headache     Hemorrhoid     Hypercholesterolemia     Hypertension        Past Surgical History:   Procedure Laterality Date    HX HEMORRHOIDECTOMY              HPI   Thomas [name] is a 78 y.o., male, who presents to [facility] outpatient [facility] clinic with a chief complain of intermittent L shoulder pain with UE activity.  He describes the pain as sharp and notes functional difficulty with lifting, carrying firewood, sitting at kitchen table,.  He states the pain radiates into the L scapular region as well.  He is using Tylenol at night to help manage his symptoms.  He rates his pain at 8/10.    Diagnostic tests: L shoulder x-rays taken on 07/19/23 show  IMPRESSION:  Degenerative change without convincing acute osseous abnormality.:       Examination findings   Quick Disabilities of the Arm, Shoulder and Hand (DASH) questionnaire score: 18% disability    ROM:   Right AROM Left AROM L PROM   Shoulder flexion WNL 150 160   Shoulder abduction WNL 150 160   Shoulder ER WNL 60 70   Shoulder IR/extension WNL 45 50   Cervical ROM WNL in all planes   Elbow ROM WNL WNL -   Wrist ROM WNL WNL -       Strength:   Right  Left    Shoulder flexion 5/5 4/5   Shoulder abduction 5/5 4/5   Shoulder ER 5/5 4/5   Shoulder IR 5/5 4/5   Elbow flexion 5/5 4+/5    Elbow extension 5/5 4+/5     Palpation: Pain was elicited on palpation of L upper trap, rhomboids, middle trap, deltoid, and supraspinatus/ infraspinatus musculature    Joint Mobility: mild hypomobility in ant/post/inf directions    Posture: Patient was seated with  rounding of bilateral shoulders with mild forward head, bilateral scapula elevated and protracted with delayed upward rotation and premature over activation of upper trapezius.    Sensation: intact          Clinical impressions   [name] presents to outpatient physical therapy with signs and symptoms related to L shoulder pain. Impairments demonstrated today include limited ROM, decreased strength, and a decline in functional mobility. Each of these impairments significantly impacts the patient's quality of life by limiting their ability to carryout ADLs/IADL including ifting, carrying firewood, sitting at kitchen table,.. The QuickDASH score further indicates personal disability. Pt will benefit from physical therapy services to address these  impairments. he was taught a HEP and performed exercises that included B scapular retraction, shoulder shrugs, standing shoulder flexion, and standing shoulder abduction.     Evaluation Complexity: Moderate complexity       Treatment   Manual therapy:x 15 minutes as PT performed STM to L upper trap, rhomboids, middle trap, deltoid, and supraspinatus/ infraspinatus musculature followed by PROM into shoulder flex/abd/IR/ER      Therapeutic exercise x 15 minutes as PT instructed patient in performance of: B scapular retraction, shoulder shrugs, standing shoulder flexion, and standing shoulder abduction x 20 each       Goals   STG:  In 2 weeks, pt will demonstrate compliance with their HEP to maximize outcomes with skilled intervention.  In 2 weeks, pt will improve QuickDASH score from 18% disability to 15% disability in order to improve QOL.   In 2 weeks, pt will decrease their worst reported pain levels from  8 to 6 to promote more normalized function.   In 2 weeks, pt will show improved sitting posture to decrease undo stress to L shoulder / interscapular region with sitting    LTG:   In 12 weeks, pt will improve QuickDASH score from 18% disability to 9% disability in order to return to prior level of functional capabilities.   In 12 weeks, pt will decrease their worst reported pain levels from 8 to 3 improve QOL and promote more normalized function.   In 12 weeks, pt will improve L UE strength to 5/5 to allow patient to perform lifting and carrying of firewood without increase in symptoms    Patient Goals: eliminate L shoulder / interscapular pain       Plan of care   Interventions to be included in the patient's plan of care include electrical stimulation, therapeutic ultrasound, taping, ice packs, moist heat, vasocompression, manual therapy, gait training, therapeutic exercise, therapeutic activity, and neuromuscular re-education. Manual therapy and modalities will be used as needed for pain control. Patient will participate in an exercise program that will help improve independence and overall level of function. Continue to progress per patient's tolerance.      Recommendations: Patient will benefit from physical therapy services for 2-3 days a week for 12 weeks. Plan to reassess as needed.     Certification of Plan               From: 07/26/23  To: 10/20/23     This is to certify that the above named patient who is under my care requires outpatient therapy services as described in the above treatment plan. The plan of care that I have established/approved will be reviewed periodically to determine continued need.    This plan of care has been sent to the referring physician for review and signature.             ____________________________________          __________                  ___________  Physician Signature                                                         Date  Time     The  risks/benefits of therapy have been discussed with the patient and he/she is in agreement with the established plan of care.       Evaluating therapist   Sunday Corn, PT  07/26/2023, 14:45

## 2023-07-28 ENCOUNTER — Other Ambulatory Visit: Payer: Self-pay

## 2023-07-28 ENCOUNTER — Ambulatory Visit (HOSPITAL_COMMUNITY)
Admission: RE | Admit: 2023-07-28 | Discharge: 2023-07-28 | Disposition: A | Discharge status: Still In Hospital | Payer: Commercial Managed Care - PPO | Source: Ambulatory Visit | Attending: Family Medicine | Admitting: Family Medicine

## 2023-07-28 ENCOUNTER — Telehealth (INDEPENDENT_AMBULATORY_CARE_PROVIDER_SITE_OTHER): Payer: Self-pay

## 2023-07-28 NOTE — Telephone Encounter (Signed)
[name]  CARDIOLOGY, Thomas [name]  [address]  [place] [address]  [phone]  Thomas [name]  Date of Service:  07/28/2023  MRN:  [id number]    Called to return patients voicemail, no answer. I left a detailed voicemail for patient to call back on my ext.    Thomas Dembeck, MA  07/28/2023, 11:27

## 2023-07-28 NOTE — PT Treatment (Signed)
[address], [place], [place], [address]  ([phone]   (Fax) [phone]    Outpatient Rehabilitation Services  .PTVISIT     Date: 07/28/2023    Patient Name: Thomas [name]  Date of Birth: [birth year]  MRN: [id number]  Payor: Payor: HUMANA MEDICARE / Plan: Francine Graven CHOICE PPO / Product Type: PPO /   Referring Physician: Rhoderick Moody, DO   Next Physician Follow-up Visit: TBD  Diagnosis:   Acute pain of left shoulder [M25.512]  - Primary      Pain of left scapula [M89.8X1]        Certification of Plan   From: 07/26/23  To: 10/20/23   Visit #:2     Subjective   Patient presents to OP PT treatment with chief complaint of mild interscapular and cervical spine soreness for a few hours a few days ago, but feels pretty good today.    Today's pain 4/10    Objective   Manual therapy:x 15 minutes as PT performed STM to L upper trap, rhomboids, middle trap, deltoid, and supraspinatus/ infraspinatus musculature followed by PROM into shoulder flex/abd/IR/ER        Therapeutic exercise x 25 minutes   AROM into cervical flex/ext/B rot x 20 each  B scapular retraction,   shoulder shrugs,   standing shoulder flexion, and   standing shoulder abduction x 20 each  GTB rows x 20  GTB pull backs x 20  Nu-step- arms (NV)      Start Time: 3:30 pm  End time: 4:12 pm        Assessment   Patient with good initial response to treatment with less frequent and less intense pain.      Plan   Next session add nu-step (arms).    The risks/benefits of therapy have been discussed with the patient and he/she is in agreement with the established plan of care.    Therapist   Sunday Corn, PT

## 2023-07-29 ENCOUNTER — Telehealth: Payer: Self-pay | Admitting: Internal Medicine

## 2023-07-29 DIAGNOSIS — J9601 Acute respiratory failure with hypoxia: Secondary | ICD-10-CM | POA: Diagnosis not present

## 2023-07-29 NOTE — Telephone Encounter (Signed)
Copied from CRM [id number]. Topic: Clinical - Request for Lab/Test Order >> Jul 29, 2023 11:16 AM Tiffany H wrote: Reason for CRM: Aldean Jewett called to verify whether or not patient is actively being seen at our clinic. Lamar Laundry will be sending in verbal orders for home health to be signed. Please return.   Phone: [phone] Fax: [phone]

## 2023-07-30 ENCOUNTER — Telehealth: Payer: Self-pay

## 2023-07-30 ENCOUNTER — Ambulatory Visit (HOSPITAL_COMMUNITY): Payer: Self-pay

## 2023-07-30 DIAGNOSIS — F338 Other recurrent depressive disorders: Secondary | ICD-10-CM

## 2023-07-30 NOTE — Telephone Encounter (Signed)
Copied from CRM [id number]. Topic: Clinical - Home Health Verbal Orders >> Jul 30, 2023  8:54 AM Truddie Crumble wrote: Caller/Agency: zacharia from [facility] home health Callback Number: [phone] Service Requested: Occupational Therapy Frequency: one time a week for three weeks Any new concerns about the patient? No

## 2023-07-30 NOTE — Nursing Note (Signed)
POPULATION HEALTH  Behavioral Health Integration - Psychiatric Collaborative Care - Follow Up    [facility]HI Enrollment Date Patient was enrolled in B[facility] on 04/14/2023  9:51 AM.    PCP Rhoderick Moody, DO   Behavioral Health Case Manager Tasia Catchings, MSW  Collaborating Psychiatrist Doristine Bosworth, MD     ASSESSMENT/ PLAN:     MSW spoke with patient via telephone this date. He went to the senior center yesterday and reports the socialization was helpful with mood. He also reports using the light therapy and it has "helped immensely". Patient has not yet heard back from [facility]. MSW spoke with him about geographical locations to explore therapy services. Patient identified [facility] [place] and [place].  MSW spoke with [facility] [place] [phone], they are on an extended wait for Medicare patients.   MSW left message for [facility] in [facility] [phone].  The counseling house in [place] [phone] is online only.   MSW will follow.      Brief Intervention(s):   Emotional Support  Follow with regular PHQ-9     Patient Goals:    Goals Addressed                   This Visit's Progress     Anxiety Symptoms Monitored and Managed   On track     Evidence-based guidance:   Explore treatment options in a youth-friendly atmosphere of hope and optimism [e.g., cognitive behavioral therapy, pharmacologic therapy, adjunct therapy (applied relaxation, mindfulness, meditation)].   Encourage ?obooster? sessions of cognitive behavioral therapy to prevent relapse that may occur 6 to 24 months after treatment has concluded.   Prepare child and caregiver for pharmacologic therapy (e.g., benzodiazepine) as short-term treatment to alleviate severe symptoms while allotting time for anxiolytic medication to become therapeutic, as well as allow for engagement    in treatment.   Prepare patient for short-term pharmacologic therapy when symptoms become debilitating (e.g.,  selective serotonin-reuptake inhibitor, serotonin-norepinephrine-reuptake inhibitor, tricyclic antidepressant).   Anticipate referral to behavioral health specialist or psychiatry when first-line treatments are not effective, anxiety is complicated by alcohol or substance use, symptoms interfere with social functioning, secondary depression or    suicidality occurs.   Consider parent-based training when child participation in cognitive behavioral therapy is not possible or child is not responding to treatment.   Address barriers to treatment that may include long wait times, delays in scheduling appointments, lack of pediatric mental health providers, transportation, inadequate or no insurance or stigma associated with mental health    disorders and treatment.    Notes:                 SUBJECTIVE:    Current Behavioral Health History:  Behavioral Health Care Management - Encounter Level:   Current Symptoms: is nervous/anxious  Recent Suicidal Thoughts?: NONE  Recent Homicidal Thoughts?: denies homicidal ideation  Psychiatric Medication Assessment:  Medication(s) seem effective?: too soon to tell  Medication Adverse Effects: No  Brief Interventions Provided this Encounter: Emotional Support  Safety risk related to mental health?: Neg          OBJECTIVE:  Validated Measures:       05/24/2023    11:00 AM 06/07/2023    11:03 AM 07/15/2023    10:00 AM   Most Recent PHQ-9 Scores   PHQ 9 Total 5 8 5        Little interest or pleasure in doing things.: 1  Feeling down,  depressed, or hopeless: 1  PHQ 2 Total: 2  Trouble falling or staying asleep, or sleeping too much.: 0  Feeling tired or having little energy: 1  Poor appetite or overeating: 1  Feeling bad about yourself/ that you are a failure in the past 2 weeks?: 0  Trouble concentrating on things in the past 2 weeks?: 1  Moving/Speaking slowly or being fidgety or restless  in the past 2 weeks?: 0  Thoughts that you would be better off DEAD, or of hurting yourself in some  way.: 0  If you checked off any problems, how difficult have these problems made it for you to do your work, take care of things at home, or get along with other people?: Not difficult at all  PHQ 9 Total: 5  Interpretation of Total Score: Mild depression          Ongoing and Updated Assessment Interventions:  PHQ9     Case Manager To Do List for the Next Interaction:   Routine follow up          Plan to call patient in ~ one month to reassess and update plan of care.  Instructed patient to call with change in symptoms or as needed prior to next follow up.       Tasia Catchings, MSW  07/30/2023, 10:51  Behavioral Health Case Manager

## 2023-08-02 ENCOUNTER — Other Ambulatory Visit: Payer: Self-pay

## 2023-08-02 ENCOUNTER — Ambulatory Visit (HOSPITAL_COMMUNITY)
Admission: RE | Admit: 2023-08-02 | Discharge: 2023-08-02 | Disposition: A | Discharge status: Still In Hospital | Payer: Commercial Managed Care - PPO | Source: Ambulatory Visit | Attending: Family Medicine | Admitting: Family Medicine

## 2023-08-02 NOTE — Telephone Encounter (Signed)
 Fine for verbals

## 2023-08-02 NOTE — Telephone Encounter (Signed)
Called and gave verbal ok orders

## 2023-08-02 NOTE — PT Treatment (Signed)
[address] [place], [place], [address]  ([phone]   (Fax) [phone]    Outpatient Rehabilitation Services  .PTVISIT     Date: 08/02/2023    Patient Name: Thomas [name]  Date of Birth: 1945-12-30  MRN: [id number]  Payor: Payor: HUMANA MEDICARE / Plan: Francine Graven CHOICE PPO / Product Type: PPO /   Referring Physician: Rhoderick Moody, DO   Next Physician Follow-up Visit: TBD  Diagnosis:   Acute pain of left shoulder [M25.512]  - Primary      Pain of left scapula [M89.8X1]         Certification of Plan   From: 07/26/23  To: 10/20/23   Visit #:3     Subjective   Patient presents to OP PT treatment with no neck pain this afternoon.  He notes this is the best he has felt in months.    Today's pain 0/10    Objective   Manual therapy:x 15 minutes as PT performed STM to L upper trap, rhomboids, middle trap, deltoid, and supraspinatus/ infraspinatus musculature followed by PROM into shoulder flex/abd/IR/ER        Therapeutic exercise x 30 minutes   AROM into cervical flex/ext/B rot x 20 each  B scapular retraction x 20  shoulder shrugs x 20  standing shoulder flexion x 20  standing shoulder abduction x 20  GTB rows x 20  GTB pull backs x 20  Nu-step- arms x 5 minutes  Cable column rows (NV)      Start Time: 2:25 pm  End time: 3:13 pm        Assessment   Patient has responded well  and has seen a reduction in his pain and improve ROM.  He is still working on Print production planner.      Plan   Next session add cable column rows.    The risks/benefits of therapy have been discussed with the patient and he/she is in agreement with the established plan of care.    Therapist   Sunday Corn, PT

## 2023-08-03 ENCOUNTER — Ambulatory Visit (HOSPITAL_COMMUNITY): Payer: Self-pay

## 2023-08-03 ENCOUNTER — Telehealth: Payer: Self-pay

## 2023-08-03 DIAGNOSIS — F329 Major depressive disorder, single episode, unspecified: Secondary | ICD-10-CM | POA: Diagnosis not present

## 2023-08-03 DIAGNOSIS — E785 Hyperlipidemia, unspecified: Secondary | ICD-10-CM | POA: Diagnosis not present

## 2023-08-03 DIAGNOSIS — J189 Pneumonia, unspecified organism: Secondary | ICD-10-CM | POA: Diagnosis not present

## 2023-08-03 DIAGNOSIS — I480 Paroxysmal atrial fibrillation: Secondary | ICD-10-CM | POA: Diagnosis not present

## 2023-08-03 DIAGNOSIS — R238 Other skin changes: Secondary | ICD-10-CM | POA: Diagnosis not present

## 2023-08-03 DIAGNOSIS — N1832 Chronic kidney disease, stage 3b: Secondary | ICD-10-CM | POA: Diagnosis not present

## 2023-08-03 DIAGNOSIS — I130 Hypertensive heart and chronic kidney disease with heart failure and stage 1 through stage 4 chronic kidney disease, or unspecified chronic kidney disease: Secondary | ICD-10-CM | POA: Diagnosis not present

## 2023-08-03 DIAGNOSIS — E1122 Type 2 diabetes mellitus with diabetic chronic kidney disease: Secondary | ICD-10-CM | POA: Diagnosis not present

## 2023-08-03 DIAGNOSIS — I5022 Chronic systolic (congestive) heart failure: Secondary | ICD-10-CM | POA: Diagnosis not present

## 2023-08-03 DIAGNOSIS — K219 Gastro-esophageal reflux disease without esophagitis: Secondary | ICD-10-CM | POA: Diagnosis not present

## 2023-08-03 DIAGNOSIS — T8453XD Infection and inflammatory reaction due to internal right knee prosthesis, subsequent encounter: Secondary | ICD-10-CM | POA: Diagnosis not present

## 2023-08-03 DIAGNOSIS — F338 Other recurrent depressive disorders: Secondary | ICD-10-CM

## 2023-08-03 NOTE — Telephone Encounter (Signed)
Copied from CRM [id number]. Topic: General - Other >> Aug 03, 2023  4:06 PM [place] J wrote: Reason for CRM: Lamar Laundry from [facility] calling in to confirm that the orders put in for the 14th need to be signed and faxed back. They are not verbal orders.  Fax: [phone]

## 2023-08-03 NOTE — Nursing Note (Signed)
POPULATION HEALTH  Behavioral Health Integration - Psychiatric Case Review     Behavioral Health Integration - Psychiatric Collaborative Care   Behavioral Health Integration - BHI  Status: Enrolled  Effective Dates: 04/14/2023 - present  Responsible Staff: Tasia Catchings, MSW      Behavioral Health Integration Care Team:  ZOX:WRUEA [place], DO  Behavioral Health Case Manager Tasia Catchings, MSW  Collaborating Psychiatrist: Doristine Bosworth, MD     ASSESSMENT/ PLAN: (Actions for PCP to Take)      No change at this time.   Recommendations for PCP regarding pharmacologic management: See Dr. Hyacinth Meeker note for details. We recommend changes (if appropriate) be made within 5 business days for continuation of care for patient.     Recommendations for Case Manager (additional assessments or interventions  or linkages/referrals to consider): MSW provided update to Dr. Hyacinth Meeker of patients reports of positive outcomes since starting light therapy at home. MSW to continue seeking outpatient therapy for patient. MSW ongoing emotional support.         SUBJECTIVE/ OBJECTIVE:  Thomas [name]'s case was reviewed by collaborating psychiatrist and BH case manager today in a telephone-video conference call.  The collaborating psychiatrist was provided a summary of the patient's behavioral health by the case manager including results of validated measures compared to baseline. The psychiatrist reviewed health history and case manager notes in the medical record.     Diagnosis Review:   No suggestions/revisions to diagnosis made at this time.      Program Progression Review:   BHI Program Proression Review: Continue Enrollment within Behavioral Health Integration      Summary of Past Behavioral Health History:  Past Behavioral Health History:     Current Behavioral Health History:  Behavioral Health Care Management - Encounter Level:   Psychiatric Medication Assessment:          PHQ 9 Follow Up Questionnaire:            05/24/2023     11:00 AM 06/07/2023    11:03 AM 07/15/2023    10:00 AM   Most Recent PHQ-9 Scores   PHQ 9 Total 5 8 5            07/15/2023    10:00 AM   Most Recent PHQ-9 Scores   PHQ 9 Total 5     Little interest or pleasure in doing things.: 1  Feeling down, depressed, or hopeless: 1  PHQ 2 Total: 2  Trouble falling or staying asleep, or sleeping too much.: 0  Feeling tired or having little energy: 1  Poor appetite or overeating: 1  Feeling bad about yourself/ that you are a failure in the past 2 weeks?: 0  Trouble concentrating on things in the past 2 weeks?: 1  Moving/Speaking slowly or being fidgety or restless  in the past 2 weeks?: 0  Thoughts that you would be better off DEAD, or of hurting yourself in some way.: 0  If you checked off any problems, how difficult have these problems made it for you to do your work, take care of things at home, or get along with other people?: Not difficult at all  PHQ 9 Total: 5  Interpretation of Total Score: Mild depression    Last Gad-7 Scale:            04/14/2023    10:00 AM   Most Recent GAD-7 Scores   Gad-7 Score Total 4          Patient Goals:  Goals Addressed    None          Total BHI time spent this encounter is 4 minutes.     Total BHI time spent this month is 18 minutes.       Tasia Catchings, MSW  08/03/2023, 10:21  I have read and reviewed the social worker's note. I agree with the current plan of care.    Doristine Bosworth, MD

## 2023-08-04 ENCOUNTER — Ambulatory Visit
Admission: RE | Admit: 2023-08-04 | Discharge: 2023-08-04 | Disposition: A | Discharge status: Still In Hospital | Payer: Commercial Managed Care - PPO | Source: Ambulatory Visit

## 2023-08-04 NOTE — Telephone Encounter (Signed)
 Noted

## 2023-08-04 NOTE — PT Treatment (Signed)
[address] [place], [place], [address]  ([phone]   (Fax) [phone]    Outpatient Rehabilitation Services  .PTVISIT     Date: 08/04/2023    Patient Name: Thomas [name]  Date of Birth: 12-27-1945  MRN: [id number]  Payor: Payor: HUMANA MEDICARE / Plan: Francine Graven CHOICE PPO / Product Type: PPO /   Referring Physician: Rhoderick Moody, DO   Next Physician Follow-up Visit: TBD  Diagnosis:   Acute pain of left shoulder [M25.512]  - Primary      Pain of left scapula [M89.8X1]         Certification of Plan   From: 07/26/23  To: 10/20/23   Visit #:4     Subjective   Patient presents to OP PT treatment with chief complaint of minimal cervical spine pain today.  He does notes he has modified his activities to avoid exacerbation of symptoms.    Today's pain 0/10    Objective   Manual therapy:x 15 minutes as PT performed STM to L upper trap, rhomboids, middle trap, deltoid, and supraspinatus/ infraspinatus musculature followed by PROM into shoulder flex/abd/IR/ER        Therapeutic exercise x 30 minutes   AROM into cervical flex/ext/B rot x 20 each  B scapular retraction x 20  shoulder shrugs x 20  standing shoulder flexion x 20  standing shoulder abduction x 20  GTB rows x 20  GTB pull backs x 20  Nu-step- arms x 6 minutes  Cable column rows  18# x 20      Start Time: 2:30 pm  End time: 3:19 pm        Assessment   Patient continues to respond well to current plan of treatment.      Plan   Continue to strengthen cervical spine, postural, and UE musculature.    The risks/benefits of therapy have been discussed with the patient and he/she is in agreement with the established plan of care.    Therapist   Sunday Corn, PT

## 2023-08-05 ENCOUNTER — Other Ambulatory Visit: Payer: Medicare HMO | Admitting: Pharmacist

## 2023-08-05 ENCOUNTER — Other Ambulatory Visit (INDEPENDENT_AMBULATORY_CARE_PROVIDER_SITE_OTHER): Payer: Self-pay

## 2023-08-05 DIAGNOSIS — J309 Allergic rhinitis, unspecified: Secondary | ICD-10-CM

## 2023-08-05 DIAGNOSIS — Z7189 Other specified counseling: Secondary | ICD-10-CM

## 2023-08-05 MED ORDER — CETIRIZINE HCL 10 MG PO TABS: 10.0000 mg | ORAL_TABLET | Freq: Every day | ORAL | 0 refills | Status: AC

## 2023-08-05 NOTE — Progress Notes (Signed)
 08/06/2023 Name: Tanner [name] MRN: [id number] DOB: 1945/10/03  Chief Complaint  Patient presents with   Medication Management   Medication review    Tanner [name] is a 78 y.o. year old male who presented for a telephone visit.   They were referred to the pharmacist by their Case Management Team  for assistance in managing  medication review .    Subjective:  Care Team: Primary Care Provider: Myrlene Broker, MD ; Next Scheduled Visit: none scheduled   Medication Access/Adherence  Current Pharmacy:  [id number] [place], [place] - [address] [place] [address] Phone: [phone] Fax: [phone]  [facility] [address] [place] [address] Phone: [phone] Fax: [phone]  [id number] [place], [place] - [address] AT [facility] OF [facility] & [address] [place] [address] Phone: [phone] Fax: [phone]  [id number] - [address], [place] - [address] AT [facility] [address] & [facility] [address] [place] [address] Phone: [phone] Fax: [phone]   Patient reports affordability concerns with their medications: No  Patient reports access/transportation concerns to their pharmacy: No  Patient reports adherence concerns with their medications:  No     Reviewed and updated medication list with patient as below Pt notes he is almost out of doxycycline and is wondering if he needs a refill   Objective:  Lab Results  Component Value Date   HGBA1C 5.3 03/22/2023    Lab Results  Component Value Date   CREATININE 1.70 (H) 07/13/2023   BUN 22 07/13/2023   NA 140 07/13/2023   [name] 4.1 07/13/2023   CL 105 07/13/2023   CO2 29 07/13/2023    Lab Results  Component Value Date   CHOL 105 12/04/2021   HDL 30.20 (L) 12/04/2021   LDLCALC 41 12/04/2021   LDLDIRECT 65.9 09/13/2007   TRIG 172.0  (H) 12/04/2021   CHOLHDL 3 12/04/2021    Medications Reviewed Today     Reviewed by Bonita Quin, RPH (Pharmacist) on 08/06/23 at 1634  Med List Status: <None>   Medication Order Taking? Sig Documenting Provider Last Dose Status Informant  acetaminophen (TYLENOL) 500 MG tablet [id number]  Take 1,000 mg by mouth every 8 (eight) hours as needed for moderate pain (pain score 4-6). Norins, Rosalyn Gess, MD  Active Nursing Home Medication Administration Guide [facility])           Med Note [facility], [place] Apr 25, 2023 12:25 AM) Recently admitted to [facility] 04/14/23  amiodarone (PACERONE) 200 MG tablet [id number] Yes Take 200 mg by mouth daily. [provider] Taking Active Self  apixaban (ELIQUIS) 5 MG TABS tablet [id number] Yes Take 5 mg by mouth 2 (two) times daily. [provider] Taking Active Nursing Home Medication Administration Guide (MAG)  atorvastatin (LIPITOR) 80 MG tablet [id number] Yes Take 80 mg by mouth daily. [provider] Taking Active Self  buPROPion (WELLBUTRIN SR) 150 MG 12 hr tablet [id number] Yes Take 150 mg by mouth 2 (two) times daily. [provider] Taking Active Nursing Home Medication Administration Guide (MAG)  carvedilol (COREG) 6.25 MG tablet [id number] Yes Take 1 tablet (6.25 mg total) by mouth 2 (two) times daily with a meal. Myrlene Broker, MD Taking Active Nursing Home Medication Administration Guide (MAG)  Med Note Yevette Edwards   Tue Apr 06, 2023  9:52 PM)    cetirizine (ZYRTEC) 10 MG tablet [id number] Yes Take 1 tablet (10 mg total) by mouth daily. Myrlene Broker, MD  Active   diphenhydrAMINE (BENADRYL) 25 MG tablet [id number] Yes Take 25 mg by mouth at bedtime. [provider] Taking Active Self  doxycycline (VIBRAMYCIN) 100 MG capsule [id number] Yes Take 1 capsule (100 mg total) by mouth 2 (two) times daily. Start doxycycline AFTER completing the course of linezolid (Zyvox) and continue  indefinitely Rai, Ripudeep K, MD Taking Active   empagliflozin (JARDIANCE) 10 MG TABS tablet [id number] Yes Take 25 mg by mouth daily. 1/2 tablet daily [provider] Taking Active Self  finasteride (PROSCAR) 5 MG tablet [id number] Yes Take 5 mg by mouth at bedtime. [provider] Taking Active Nursing Home Medication Administration Guide (MAG)  gabapentin (NEURONTIN) 300 MG capsule [id number] Yes Take 1 capsule (300 mg total) by mouth 3 (three) times daily. Cathren Harsh, MD Taking Active            Med Note Earlene Plater, [place]   Wed Jul 14, 2023  1:51 PM) Patient reports received from the [place]: takes 2 capsules in am, 2 caps at lunch, 2 capsules at supper and 1 at bedtime.   guaiFENesin (ROBITUSSIN) 100 MG/5ML liquid [id number]  Take 5 mLs by mouth every 4 (four) hours as needed for cough or to loosen phlegm. [provider]  Active Nursing Home Medication Administration Guide [facility])           Med Note (WHITE, [place] Apr 25, 2023 12:25 AM) Recently admitted to [facility] 04/14/23   Multiple Vitamins-Minerals (MULTIVITAMINS THER. W/MINERALS) Tanner [name] [id number]  Take 1 tablet by mouth daily. [provider]  Active Nursing Home Medication Administration Guide (MAG)  nortriptyline (PAMELOR) 10 MG capsule [id number] Yes Take 20 mg by mouth at bedtime. [provider] Taking Active Nursing Home Medication Administration Guide (MAG)  pantoprazole (PROTONIX) 40 MG tablet [id number] No Take 1 tablet (40 mg total) by mouth daily at 12 noon.  Patient not taking: Reported on 08/05/2023   Tanner Bees, MD Not Taking Active Nursing Home Medication Administration Guide (MAG)  polyethylene glycol (MIRALAX / GLYCOLAX) 17 g packet [id number]  Take 17 g by mouth daily. Tanner Bees, MD  Active Nursing Home Medication Administration Guide (MAG)           Med Note Earlene Plater, [place]   Wed Jul 14, 2023  1:53 PM) Reports takes as needed  senna (SENOKOT) 8.6 MG TABS tablet  [id number]  Take 2 tablets (17.2 mg total) by mouth at bedtime. Tanner Bees, MD  Active Nursing Home Medication Administration Guide (MAG)           Med Note Earlene Plater, [place]   Wed Jul 14, 2023  1:53 PM) Leanora Ivanoff as needed  torsemide (DEMADEX) 20 MG tablet [id number] Yes Take 20 mg by mouth daily. [provider] Taking Active Self  Med List Note Larrie Kass, CPhT 04/25/23 0006): [facility] [phone]              Assessment/Plan:   Medication review: - Recommended trying cetirizine instead of benadryl for itching due to anticholingeric effects with diphenhydramine - Due to renal function, gabapentin max dose should be 900 mg/day. Pt is taking 2100 mg per day total currently. He gets this from the [place]. Recommended reducing dose to 1 tablet  TID and speak to [facility] provider about his dose w/ his renal function. - PCP notes pt requires ID follow up for doxycycline, his previous appt got cancelled while was in SNF. [facility] notes [facility]. Pt informed that he should follow up with ID.  Follow Up Plan: PRN  Arbutus Leas, PharmD, BCPS, CPP Clinical Pharmacist Practitioner [facility] [phone]

## 2023-08-05 NOTE — Nursing Note (Signed)
POPULATION HEALTH    COMPLEX CARE MANAGEMENT    Chronic Care Management - CCM  Status: Enrolled  Effective Dates: 12/02/2022 - present  Responsible Staff: Alden Benjamin, RN    Behavioral Health Integration - BHI  Status: Enrolled  Effective Dates: 04/14/2023 - present  Responsible Staff: Tasia Catchings, MSW          Patient reports to be doing well. He did see PCP and she felt should breast chest pain was muscle related xrays revealed Degenerative changes. Patient was recommended PT . Patient has been attending and the pain was resolved after the first few visits.  Patient continued to cut fire wood and usually daily activities. Patient did have a twinge under breast/ chest area during PT but it did go away.  Patient does have an appointment with cardio 4/23 1:40pm. Patient confirmed the next few PT appointments 2/24 at 2:30pm and 2/26 at 2:30pm. Patient denies  current CP / SOB. Patient BP has been controled last was 138/72. Positive results after light therapy, patient feels much better .  [facility] will continue to follow patient     Care Management Tasks Completed:  Chart review completed.  Social determinants of health reviewed and updated as needed.  Care plan reviewed/discussed/updated.  Interventions ongoing and updated as needed.  Goals addressed and updated.  Interventions ongoing and updated as needed.  Education completed.  Self management plan reviewed and updated.  See patient care coordination note.  Barriers to health, care plan, and goals reviewed.  Interventions ongoing and updated as needed.  Health maintenance/care gaps reviewed and discussed with patient.  Health maintenance/care gaps updated as applicable.  Discussed upcoming appointment dates and times.  Reinforced importance of keeping all provider appointments.  Reinforced the importance of taking all medications as prescribed.  Medical history, surgical history, hospitalizations, and medications reviewed and updated as  applicable.    Ongoing and Updated Assessment Interventions:  Encouraged BP Checks   Reviewed BHI Light therapy   Encouraged to call if any Chest Pain  Discussed Shoulder Xrays / PT   Confirmed upcoming appointments     Case Manager To Do List for the Next Interaction:  Monthly follow up  F/U PT shoulder chest pain  F/U cardiac   F/U BP     Plan to call patient in ~ one month to reassess and update plan of care.  Instructed patient to call with change in symptoms or as needed prior to next follow up.      Alden Benjamin, RN

## 2023-08-06 ENCOUNTER — Ambulatory Visit
Admission: RE | Admit: 2023-08-06 | Discharge: 2023-08-06 | Disposition: A | Discharge status: Home / Self Care | Payer: Medicare HMO | Source: Ambulatory Visit | Attending: Internal Medicine | Admitting: Internal Medicine

## 2023-08-06 ENCOUNTER — Telehealth: Payer: Self-pay

## 2023-08-06 DIAGNOSIS — N2889 Other specified disorders of kidney and ureter: Secondary | ICD-10-CM

## 2023-08-06 NOTE — Telephone Encounter (Signed)
Copied from CRM [id number]. Topic: Clinical - Lab/Test Results >> Aug 06, 2023  3:36 PM Sonny Dandy B wrote: Reason for CRM: [facility]  called requesting to request the provider to order Kidney, Renal CT with contrast. Or MRI. They stated pt was unable to complete the test. They started 5 IV and every one of them Blew. They are requesting the provide to send pt to another  the hospital. To get a ultrasound Guided IV. [phone]

## 2023-08-06 NOTE — Patient Instructions (Signed)
 It was a pleasure speaking with you today!    Feel free to call with any questions or concerns!  Arbutus Leas, PharmD, BCPS, CPP Clinical Pharmacist Practitioner [place] [facility] at [facility] [phone]

## 2023-08-09 ENCOUNTER — Other Ambulatory Visit: Payer: Self-pay

## 2023-08-09 ENCOUNTER — Ambulatory Visit (HOSPITAL_COMMUNITY)
Admission: RE | Admit: 2023-08-09 | Discharge: 2023-08-09 | Disposition: A | Discharge status: Still In Hospital | Payer: Commercial Managed Care - PPO | Source: Ambulatory Visit | Attending: Family Medicine | Admitting: Family Medicine

## 2023-08-09 NOTE — Telephone Encounter (Signed)
 Can you see if referrals can just change location to hospital and use same order for CT with and without.

## 2023-08-09 NOTE — PT Treatment (Signed)
 [address] [place], [place], [address]  ([phone]   (Fax) [phone]    Outpatient Rehabilitation Services  .PTVISIT     Date: 08/09/2023    Patient Name: Thomas [name]  Date of Birth: 07/28/1945  MRN: [id number]  Payor: Payor: HUMANA MEDICARE / Plan: Francine Graven CHOICE PPO / Product Type: PPO /   Referring Physician: Rhoderick Moody, DO   Next Physician Follow-up Visit: TBD  Diagnosis:   Acute pain of left shoulder [M25.512]  - Primary      Pain of left scapula [M89.8X1]         Certification of Plan   From: 07/26/23  To: 10/20/23   Visit #:5    Subjective   Patient presents to OP PT treatment with no pain complaints this afternoon.  He notes this is the best he has felt in a long time.    Today's pain 0/10    Objective   Manual therapy:x 15 minutes as PT performed STM to L upper trap, rhomboids, middle trap, deltoid, and supraspinatus/ infraspinatus musculature followed by PROM into shoulder flex/abd/IR/ER        Therapeutic exercise x 30 minutes   AROM into cervical flex/ext/B rot x 20 each  B scapular retraction x 20  shoulder shrugs x 20  standing shoulder flexion x 20  standing shoulder abduction x 20  GTB rows x 20  GTB pull backs x 20  Nu-step- arms x 6 minutes  Cable column rows  18# x 20         Start Time: 1:58 pm  End time: 2:55 pm        Assessment   Patient is pleased with the progress he has made and remains compliant with his HEP.      Plan   Prpeare for DC to HEP if signs and symptoms will allow.    The risks/benefits of therapy have been discussed with the patient and he/she is in agreement with the established plan of care.    Therapist   Sunday Corn, PT

## 2023-08-10 ENCOUNTER — Ambulatory Visit: Payer: Self-pay

## 2023-08-10 ENCOUNTER — Telehealth: Payer: Self-pay | Admitting: Internal Medicine

## 2023-08-10 NOTE — Telephone Encounter (Signed)
 Fine for verbals

## 2023-08-10 NOTE — Patient Instructions (Signed)
 Visit Information  Thank you for taking time to visit with me today. Please don't hesitate to contact me if I can be of assistance to you.   Following are the goals we discussed today:  Contact Infectious Disease Provider, Dr. Thedore Mins [phone] to update on status of your knee and schedule appointment as soon as possible Contact Cardiologist, Dr. Wyline Mood, [phone], to schedule an follow up appointment Continue with therapy sessions as recommended Eat healthy Take medications as Prescribed Contact your provider with health questions or concerns  Our next appointment is by telephone on 08/19/23 at 11:30 am  Please call the care guide team at [phone] if you need to cancel or reschedule your appointment.   If you are experiencing a Mental Health or Behavioral Health Crisis or need someone to talk to, please call the Suicide and Crisis Lifeline: 988 call the [place] National Suicide Prevention Lifeline: 254-056-2316 or TTY: 972-686-1841 TTY 941-494-0923) to talk to a trained counselor   Kathyrn Sheriff, RN, MSN, BSN, CCM [place]  [facility], Population Health Case Manager Phone: [phone]

## 2023-08-10 NOTE — Telephone Encounter (Signed)
 Copied from CRM [id number]. Topic: Clinical - Home Health Verbal Orders >> Aug 10, 2023 10:45 AM Claudine Mouton wrote: Caller/Agency: Sonya with [facility]lyn[facility] Jaeger Number: [phone] Service Requested: Speech Therapy Frequency: NA Any new concerns about the patient? Yes  Add on discipline Reschedule speech therapy  Original orders submitted on 08/02/23

## 2023-08-10 NOTE — Patient Outreach (Signed)
 Care Coordination   Follow Up Visit Note   08/10/2023 Name: Tanner [name] MRN: [id number] DOB: [birth year]  Tanner [name] is a 78 y.o. year old male who sees Tanner Broker, MD for primary care. I spoke with  Tanner [name] by phone today.  What matters to the patients health and wellness today? Discharged from [facility] rehab 06/28/23 04/24/23-05/05/23 acute multifocal pneumonia/hypoxic resp failure/sepsis transferred to [facility] 04/06/23-04/14/23 infection prosthetic right knee joint  03/18/23-03/26/23 aspiration pneumonia 12/2022 R TKA H/o stage 3b CKD, HTN, CHF, DM  Tanner [name] remains active with home health. Patient reports OT made home visit today. He reports right knee reddened with some tenderness noted. Patient reports he has scheduled an appointment with orthopedic provider tomorrow. Due to being in skilled facility for extended period of time, patient has not had a follow up with ID. RNCM also recommended patient contact ID, Dr. Thedore Mins to notify and schedule an appointment. Contact number provided. Patient states he will call to schedule. Also encouraged to contact cardiology, Dr. Wyline Mood, to schedule a follow up with cardiologist.  Reviewed instructions per clinical pharmacy outreach on 08/05/23. Patient states he has decreased amount of neurontin he is taking. He states he is down to 4 tablets/day and states when he knows his pain will be controlled with that dose that he will decrease to 3 tablets daily. [facility] [place] [facility].   Goals Addressed             This Visit's Progress    Assistance with Health Management       Interventions Today    Flowsheet Row Most Recent Value  Chronic Disease   Chronic disease during today's visit Other, Congestive Heart Failure (CHF), Chronic Kidney Disease/End Stage Renal Disease (ESRD), Hypertension (HTN)  [discharge from [facility] 06/28/23]  General Interventions    General Interventions Discussed/Reviewed General Interventions Reviewed, Doctor Visits, Durable Medical Equipment (DME)  [Evaluation of current treatment plan for health condition and patient's adherence to plan.]  Doctor Visits Discussed/Reviewed PCP, Specialist, Doctor Visits Reviewed  Durable Medical Equipment (DME) Wheelchair, Other  [patient reports [facility] put up a metal ramp at patient's home]  Wheelchair Motorized  [patients reports getting assessed by the [place] regarding motorized chair]  PCP/Specialist Visits Compliance with follow-up visit  [advised to schedule appointment with ID(contact number provided) and cardiologist, Dr. Wyline Mood  Exercise Interventions   Exercise Discussed/Reviewed Physical Activity, Exercise Reviewed  Tanner [name] with home health therapy]  Education Interventions   Education Provided Provided Education  Provided Verbal Education On Medication, Exercise, When to see the doctor  [advised to take medications as prescribed, eat healthy, work with therapist as recommended, contact provider with health questions/concerns.]  Nutrition Interventions   Nutrition Discussed/Reviewed Nutrition Discussed  Pharmacy Interventions   Pharmacy Dicussed/Reviewed Pharmacy Topics Reviewed  [reviewed with patient instructions/recommendations per clinical pharmacist]  Safety Interventions   Safety Discussed/Reviewed Safety Reviewed, Fall Risk  [confirmed still active with therapy, discussed importance of performing exercises recommended by therapist]            SDOH assessments and interventions completed:  Yes  Care Coordination Interventions:  Yes, provided   Follow up plan: Follow up call scheduled for 08/19/23    Encounter Outcome:  Patient Visit Completed   Tanner Sheriff, RN, MSN, BSN, CCM [place]  [facility], Population Health Case Manager Phone: [phone]

## 2023-08-11 ENCOUNTER — Ambulatory Visit
Admission: RE | Admit: 2023-08-11 | Discharge: 2023-08-11 | Disposition: A | Discharge status: Still In Hospital | Payer: Self-pay | Source: Ambulatory Visit | Attending: Family Medicine | Admitting: Family Medicine

## 2023-08-11 ENCOUNTER — Other Ambulatory Visit: Payer: Self-pay

## 2023-08-11 DIAGNOSIS — Z96651 Presence of right artificial knee joint: Secondary | ICD-10-CM | POA: Diagnosis not present

## 2023-08-11 DIAGNOSIS — M25561 Pain in right knee: Secondary | ICD-10-CM | POA: Diagnosis not present

## 2023-08-11 NOTE — PT Treatment (Signed)
 [address] [place], [place], [address]  ([phone]   (Fax) [phone]    Outpatient Rehabilitation Services  PT Discharge Note    Date: 08/11/2023    Patient Name: Thomas [name]  Date of Birth: 1945-09-30  MRN: [id number]  Payor: Payor: [employer] MEDICARE / Plan: Francine Graven CHOICE PPO / Product Type: PPO /   Referring Physician: Rhoderick Moody, DO   Next Physician Follow-up Visit: TBD  Diagnosis:   Acute pain of left shoulder [M25.512]  - Primary      Pain of left scapula [M89.8X1]         Certification of Plan   From: 07/26/23  To: 10/20/23   Visit #:6     Subjective   Patient presents to OP PT treatment with no complaints.       Objective   Manual therapy:x 15 minutes  STM to L upper trap, rhomboids, middle trap, deltoid, and supraspinatus/ infraspinatus musculature  PROM into shoulder flex/abd/IR/ER        Therapeutic exercise x 25 minutes   AROM into cervical flex/ext/B rot x 20 each  B scapular retraction x 20  shoulder shrugs x 20  standing shoulder flexion x 20  standing shoulder abduction x 20  GTB rows x 20  GTB pull backs x 20  Nu-step- arms x 6 minutes  Cable column rows  18# x 20      Quick Disabilities of the Arm, Shoulder and Hand (DASH) questionnaire score: 0% disability     ROM:    Right AROM Left AROM L PROM   Shoulder flexion WNL 165 170   Shoulder abduction WNL 165 170   Shoulder ER WNL 75 80   Shoulder IR/extension WNL 60 65   Cervical ROM WNL in all planes   Elbow ROM WNL WNL -   Wrist ROM WNL WNL -         Strength:    Right  Left    Shoulder flexion 5/5 5/5   Shoulder abduction 5/5 5/5   Shoulder ER 5/5 5/5   Shoulder IR 5/5 5/5   Elbow flexion 5/5 5/5   Elbow extension 5/5 5/5      STG:  In 2 weeks, pt will demonstrate compliance with their HEP to maximize outcomes with skilled intervention. MET  In 2 weeks, pt will improve QuickDASH score from 18% disability to 15% disability in order to improve QOL. MET  In 2 weeks, pt will decrease their worst reported pain levels from 8 to 6 to  promote more normalized function. MET  In 2 weeks, pt will show improved sitting posture to decrease undo stress to L shoulder / interscapular region with sitting. MET     LTG:   In 12 weeks, pt will improve QuickDASH score from 18% disability to 9% disability in order to return to prior level of functional capabilities. MET  In 12 weeks, pt will decrease their worst reported pain levels from 8 to 3 improve QOL and promote more normalized function. MET  In 12 weeks, pt will improve L UE strength to 5/5 to allow patient to perform lifting and carrying of firewood without increase in symptoms. MET     Patient Goals: eliminate L shoulder / interscapular pain    Start Time: 14:20  End time: 15:00        Assessment   Pt rec'd STM and PROM as above, f/b ex program as detailed above. Pt re assessed today as noted above which revealed pt  has achieved all goals and will D/C PT services @ this time to HEP.      Plan   D/C PT.    The risks/benefits of therapy have been discussed with the patient and he/she is in agreement with the established plan of care.    Therapist   Candis Shine, PTA

## 2023-08-12 NOTE — Telephone Encounter (Signed)
 Copied from CRM [id number]. Topic: General - Other >> Aug 12, 2023 11:02 AM Gurney Maxin H wrote: Reason for CRM: [facility] is calling stating they sent orders for speech therapy and reschedule appointment but need orders signed that was faxed to office on 2/13. Advised caller verbals were given and caller stated they need the orders signed and returned.  Sonya [phone]

## 2023-08-12 NOTE — Telephone Encounter (Signed)
 Called and left verbals for the patient

## 2023-08-13 ENCOUNTER — Ambulatory Visit (INDEPENDENT_AMBULATORY_CARE_PROVIDER_SITE_OTHER): Payer: Self-pay | Admitting: Family Medicine

## 2023-08-13 ENCOUNTER — Other Ambulatory Visit (INDEPENDENT_AMBULATORY_CARE_PROVIDER_SITE_OTHER): Payer: Self-pay

## 2023-08-13 DIAGNOSIS — F338 Other recurrent depressive disorders: Secondary | ICD-10-CM

## 2023-08-13 DIAGNOSIS — I10 Essential (primary) hypertension: Secondary | ICD-10-CM

## 2023-08-13 DIAGNOSIS — Z7189 Other specified counseling: Secondary | ICD-10-CM

## 2023-08-13 DIAGNOSIS — E782 Mixed hyperlipidemia: Secondary | ICD-10-CM

## 2023-08-13 NOTE — Progress Notes (Signed)
 Behavioral Health Integration --Last encounter this month  Behavioral Health Integration - B[facility]  Status: Enrolled  Effective Dates: 04/14/2023 - present  Responsible Staff: Tasia Catchings, MSW      BHI Care Team:   Primary Care Provider Rhoderick Moody, DO  Behavioral Health Case Manager Tasia Catchings, MSW  Collaborating Psychiatrist Doristine Bosworth, MD    Behavioral Health Integration Psychiatric Collaborative Care services have been performed by behavioral health case manager during this calendar month under the supervision of primary care provider Rhoderick Moody, DO for behavioral health condition(s):  No diagnosis found.    The patient hasbeen added to a patient roster for weekly case load review with collaborating psychiatrist.  [name] treatment plan and status has been reviewed in weekly case conference with collaborating psychiatrist.    Brief interventions provided by case manager during this calendar month: emotional support    Validated Measures used to monitor patient progress during this calendar month:      05/24/2023    11:00 AM 06/07/2023    11:03 AM 07/15/2023    10:00 AM   Most Recent PHQ-9 Scores   PHQ 9 Total 5 8 5            04/14/2023    10:00 AM   Most Recent GAD-7 Scores   Gad-7 Score Total 4           Total BHI time spent this month is 18 minutes.     Tasia Catchings, MSW  08/13/2023, 10:26

## 2023-08-13 NOTE — Progress Notes (Signed)
 Dr. Rhoderick Moody, DO  ,    This patient has met the requirements to bill for Chronic Care Management services this month. Please sign this encounter as you typically would any other encounter. There is NO SPECIAL ATTESTATION needed . COSIGN ONLY. Please let us know if you have any questions at all.    Chronic Care Management Time Documentation on 08/13/2023 16:35.   Time spent during current encounter is 1 minutes.   Cumulative time during current month's episode (month-to-date) is 40 minutes.          ICD-10-CM    1. Encounter for counseling for care management of patient with chronic conditions and complex health needs using nurse-based model  Z71.89       2. Seasonal depression (CMS HCC)  F33.8       3. Essential hypertension  I10       4. Mixed hyperlipidemia  E78.2             acetaminophen (TYLENOL) 500 mg Oral Tablet, Take 1 Tablet (500 mg total) by mouth Every 4 hours as needed for Pain (pt takes tylenol pm)  alfuzosin (UROXATRAL) 10 mg Oral Tablet Sustained Release 24 hr, Take 1 Tablet (10 mg total) by mouth Once a day  apixaban (ELIQUIS) 5 mg Oral Tablet, Take 1 Tablet (5 mg total) by mouth Twice daily SHIP TO PATIENT,   UEA-[id number]  STATE LICENSE- [id number]  NPI- [id number]  clotrimazole (MYCELEX) 10 mg Mucous Membrane Troche, Take 1 Troche (10 mg total) by mouth Three times a day  diltiazem HCl (TIAZAC) 240 mg Oral Capsule,Sustained Action 24 hr, Take 1 Capsule (240 mg total) by mouth Once a day  finasteride (PROSCAR) 5 mg Oral Tablet, Take 1 Tablet (5 mg total) by mouth Once a day  flecainide (TAMBOCOR) 100 mg Oral Tablet, Take 1 Tablet (100 mg total) by mouth Twice daily  furosemide (LASIX) 40 mg Oral Tablet, Take 1 Tablet (40 mg total) by mouth Once a day  losartan (COZAAR) 50 mg Oral Tablet, Take 1 Tablet (50 mg total) by mouth Once a day  nitroGLYCERIN (NITROSTAT) 0.4 mg Sublingual Tablet, Sublingual, Place 1 Tablet (0.4 mg total) under the tongue Every 5 minutes as needed for Chest pain  nystatin  (MYCOSTATIN) 100,000 unit/mL Oral Suspension, Take 5 mL by mouth Four times a day  potassium chloride (KLOR-CON) 10 mEq Oral Tablet Sustained Release, Take 1 Tablet (10 mEq total) by mouth Once a day  rosuvastatin (CRESTOR) 10 mg Oral Tablet, Take 1 Tablet (10 mg total) by mouth Every evening  SPIRIVA WITH HANDIHALER 18 mcg Inhalation Capsule, w/Inhalation Device, Take 1 Capsule (18 mcg total) by inhalation Once a day    No facility-administered medications prior to visit.        Thanks,  Your Chronic Care Management Team

## 2023-08-18 ENCOUNTER — Ambulatory Visit (HOSPITAL_COMMUNITY): Payer: Self-pay

## 2023-08-18 DIAGNOSIS — F338 Other recurrent depressive disorders: Secondary | ICD-10-CM

## 2023-08-18 NOTE — Nursing Note (Signed)
 POPULATION HEALTH  Behavioral Health Integration - Psychiatric Collaborative Care - Follow Up    BHI Enrollment Date Patient was enrolled in BHI on 04/14/2023  9:51 AM.    PCP Rhoderick Moody, DO   Behavioral Health Case Manager Tasia Catchings, MSW  Collaborating Psychiatrist Doristine Bosworth, MD     ASSESSMENT/ PLAN:     MSW spoke with patient via telephone this date for routine follow up. He continues with light therapy, no more movement with therapy, he is on waitlist with [facility], no success with finding accepting therapist as of yet. Patient reports doing well, he has been active and reports overall improvement of mood. PHQ9 completed. Msw encouraged ongoing communication of need. MSW and patient discussed the role socialization has on his mood, he identifies it significant helps.      Brief Intervention(s):   Emotional Support  Mood Regulation  Follow with regular PHQ-9     Patient Goals:    Goals Addressed                   This Visit's Progress     Anxiety Symptoms Monitored and Managed   On track     Evidence-based guidance:   Explore treatment options in a youth-friendly atmosphere of hope and optimism [e.g., cognitive behavioral therapy, pharmacologic therapy, adjunct therapy (applied relaxation, mindfulness, meditation)].   Encourage ?obooster? sessions of cognitive behavioral therapy to prevent relapse that may occur 6 to 24 months after treatment has concluded.   Prepare child and caregiver for pharmacologic therapy (e.g., benzodiazepine) as short-term treatment to alleviate severe symptoms while allotting time for anxiolytic medication to become therapeutic, as well as allow for engagement    in treatment.   Prepare patient for short-term pharmacologic therapy when symptoms become debilitating (e.g., selective serotonin-reuptake inhibitor, serotonin-norepinephrine-reuptake inhibitor, tricyclic antidepressant).   Anticipate referral to behavioral health specialist or psychiatry  when first-line treatments are not effective, anxiety is complicated by alcohol or substance use, symptoms interfere with social functioning, secondary depression or    suicidality occurs.   Consider parent-based training when child participation in cognitive behavioral therapy is not possible or child is not responding to treatment.   Address barriers to treatment that may include long wait times, delays in scheduling appointments, lack of pediatric mental health providers, transportation, inadequate or no insurance or stigma associated with mental health    disorders and treatment.    Notes:                 SUBJECTIVE:    Current Behavioral Health History:  Behavioral Health Care Management - Encounter Level:   Recent Suicidal Thoughts?: NONE  Recent Homicidal Thoughts?: denies homicidal ideation  Psychiatric Medication Assessment:  Medication(s) seem effective?: too soon to tell  Brief Interventions Provided this Encounter: Emotional Support, Mood Regulation  Safety risk related to mental health?: Neg          OBJECTIVE:  Validated Measures:       06/07/2023    11:03 AM 07/15/2023    10:00 AM 08/18/2023    11:00 AM   Most Recent PHQ-9 Scores   PHQ 9 Total 8 5 0       Little interest or pleasure in doing things.: 0  Feeling down, depressed, or hopeless: 0  PHQ 2 Total: 0  Trouble falling or staying asleep, or sleeping too much.: 0  Feeling tired or having little energy: 0  Poor appetite or overeating: 0  Feeling bad about yourself/ that  you are a failure in the past 2 weeks?: 0  Trouble concentrating on things in the past 2 weeks?: 0  Moving/Speaking slowly or being fidgety or restless  in the past 2 weeks?: 0  Thoughts that you would be better off DEAD, or of hurting yourself in some way.: 0  If you checked off any problems, how difficult have these problems made it for you to do your work, take care of things at home, or get along with other people?: Not difficult at all  PHQ 9 Total: 0  Interpretation of Total  Score: Mild depression          Ongoing and Updated Assessment Interventions:  PHQ9     Case Manager To Do List for the Next Interaction:   Routine follow up  Consideration of relapse prevention to discuss with Dr. Hyacinth Meeker.      Plan to call patient in ~ one month to reassess and update plan of care.  Instructed patient to call with change in symptoms or as needed prior to next follow up.       Tasia Catchings, MSW  08/18/2023, 11:07  Behavioral Health Case Manager

## 2023-08-19 ENCOUNTER — Telehealth: Payer: Self-pay

## 2023-08-19 NOTE — Patient Outreach (Signed)
 Care Coordination   08/19/2023 Name: Tanner [name] MRN: [id number] DOB: 1946/03/04   Care Coordination Outreach Attempts:  An unsuccessful outreach was attempted for an appointment today.  Follow Up Plan:  Additional outreach attempts will be made to offer the patient complex care management information and services.   Encounter Outcome:  No Answer   Care Coordination Interventions:  No, not indicated    Kathyrn Sheriff, RN, MSN, BSN, CCM [place]  [facility], Population Health Case Manager Phone: [phone]

## 2023-08-24 NOTE — Telephone Encounter (Signed)
 Please fax paper orders

## 2023-08-25 NOTE — Telephone Encounter (Signed)
 done

## 2023-08-26 DIAGNOSIS — J9601 Acute respiratory failure with hypoxia: Secondary | ICD-10-CM | POA: Diagnosis not present

## 2023-08-30 NOTE — Telephone Encounter (Signed)
 [facility] has still not received the pw.   Please fax again.  I advised Sonya to come in and we could give her a physical copy if it is convenient for her.

## 2023-08-30 NOTE — Telephone Encounter (Signed)
 Done

## 2023-08-30 NOTE — Telephone Encounter (Signed)
 [facility] came in today and picked up form

## 2023-08-31 NOTE — Telephone Encounter (Signed)
 Tanner [name] came to the office and said the wrong form was given to them. They provided the correct form and it has been placed in the provider's box up front. They would like for it to be faxed to [phone]. Tanner [name] would like a call when it is faxed at [phone].

## 2023-09-01 ENCOUNTER — Telehealth: Payer: Self-pay | Admitting: Internal Medicine

## 2023-09-01 ENCOUNTER — Encounter: Payer: Self-pay | Admitting: Internal Medicine

## 2023-09-01 NOTE — Telephone Encounter (Signed)
 Copied from CRM [id number]. Topic: General - Other >> Sep 01, 2023 10:53 AM Truddie Crumble wrote: Reason for CRM: adraine from [facility] called stating she went into the office on yesterday to drop off an order for patient and she has not heard anything back. The office told me that it is in the provider box [phone]

## 2023-09-02 ENCOUNTER — Other Ambulatory Visit (INDEPENDENT_AMBULATORY_CARE_PROVIDER_SITE_OTHER): Payer: Self-pay

## 2023-09-02 ENCOUNTER — Ambulatory Visit (HOSPITAL_COMMUNITY): Payer: Self-pay

## 2023-09-02 DIAGNOSIS — F338 Other recurrent depressive disorders: Secondary | ICD-10-CM

## 2023-09-02 DIAGNOSIS — Z7189 Other specified counseling: Secondary | ICD-10-CM

## 2023-09-02 MED ORDER — NITROGLYCERIN 0.4 MG SUBLINGUAL TABLET: 0.4000 mg | SUBLINGUAL_TABLET | SUBLINGUAL | 1 refills | Status: DC | PRN

## 2023-09-02 MED ORDER — CLOTRIMAZOLE 10 MG TROCHE
10.0000 mg | Freq: Three times a day (TID) | 1 refills | Status: DC
Start: 2023-09-02 — End: 2023-10-14

## 2023-09-02 MED ORDER — DILTIAZEM ER 240 MG CAPSULE,24 HR,EXTENDED RELEASE
240.0000 mg | ORAL_CAPSULE | Freq: Every day | ORAL | 1 refills | Status: DC
Start: 2023-09-02 — End: 2023-12-21

## 2023-09-02 NOTE — Telephone Encounter (Signed)
 Please sign when you have a chance

## 2023-09-02 NOTE — Telephone Encounter (Signed)
 I have faxed this back over

## 2023-09-02 NOTE — Nursing Note (Signed)
 POPULATION HEALTH  Behavioral Health Integration - Psychiatric Case Review     Behavioral Health Integration - Psychiatric Collaborative Care   Behavioral Health Integration - BHI  Status: Enrolled  Effective Dates: 04/14/2023 - present  Responsible Staff: Luanna Rung, MSW      Behavioral Health Integration Care Team:  [id number] [place], DO  Behavioral Health Case Manager Luanna Rung, MSW  Collaborating Psychiatrist: Helene Loader, MD     ASSESSMENT/ PLAN: (Actions for PCP to Take)     Relapse prevention. No change for PCP.   Recommendations for PCP regarding pharmacologic management: See Dr. Annabell Key note for details. We recommend changes (if appropriate) be made within 5 business days for continuation of care for patient.     Recommendations for Case Manager (additional assessments or interventions  or linkages/referrals to consider): MSW and Dr. Annabell Key discussed patient, PHQ9 reviewed. Patient continues with light therapy. Patient on waitlist with [facility] for therapy services.    Dr. Annabell Key encouraged light therapy halt in may/june and continue in the fall.   Relapse prevention. MSW continue with emotional support and prepare for graduation.      SUBJECTIVE/ OBJECTIVE:  Thomas [name]'s case was reviewed by collaborating psychiatrist and BH case manager today in a telephone-video conference call.  The collaborating psychiatrist was provided a summary of the patient's behavioral health by the case manager including results of validated measures compared to baseline. The psychiatrist reviewed health history and case manager notes in the medical record.     Diagnosis Review:   No suggestions/revisions to diagnosis made at this time.      Program Progression Review:   BHI Program Proression Review: Consider Relapse Prevention      Summary of Past Behavioral Health History:  Past Behavioral Health History:     Current Behavioral Health History:  Behavioral Health Care Management -  Encounter Level:   Psychiatric Medication Assessment:          PHQ 9 Follow Up Questionnaire:            06/07/2023    11:03 AM 07/15/2023    10:00 AM 08/18/2023    11:00 AM   Most Recent PHQ-9 Scores   PHQ 9 Total 8 5 0           08/18/2023    11:00 AM   Most Recent PHQ-9 Scores   PHQ 9 Total 0     Little interest or pleasure in doing things.: 0  Feeling down, depressed, or hopeless: 0  PHQ 2 Total: 0  Trouble falling or staying asleep, or sleeping too much.: 0  Feeling tired or having little energy: 0  Poor appetite or overeating: 0  Feeling bad about yourself/ that you are a failure in the past 2 weeks?: 0  Trouble concentrating on things in the past 2 weeks?: 0  Moving/Speaking slowly or being fidgety or restless  in the past 2 weeks?: 0  Thoughts that you would be better off DEAD, or of hurting yourself in some way.: 0  If you checked off any problems, how difficult have these problems made it for you to do your work, take care of things at home, or get along with other people?: Not difficult at all  PHQ 9 Total: 0  Interpretation of Total Score: Mild depression    Last Gad-7 Scale:            04/14/2023    10:00 AM   Most Recent GAD-7 Scores  Gad-7 Score Total 4          Patient Goals:    Goals Addressed    None          Total BHI time spent this encounter is 5 minutes.     Total BHI time spent this month is 11 minutes.       Luanna Rung, MSW  09/02/2023, 13:32  I have read and reviewed the social worker's note. I agree with the current plan of care.    Helene Loader, MD

## 2023-09-02 NOTE — Nursing Note (Signed)
 POPULATION HEALTH    DISEASE MANAGEMENT COORDINATOR    Patient reports to be doing really well. He has not had any chest or shoulder pain since he completed PT. Patient remains active outside. He is also implementing a healthy diet. Patient denies CP, SOB,and Dizziness. Patient still in BHI program. He continues to get light therapy he is on the wait list for [facility] therapy. Patient has Podiatry appointment next Tuesday . Patient has Cardiac 4/23 at 1:25pm and PCP 4/28 at 3:15pm.  Patient plans to attend all appointments. Patient will be in need of a few medications. [facility] will pend scripts to PCP. Patient is going to play cards with his friends later today. No other needs at this time. [facility] will continue to follow patient.     Chronic Care Management Interventions:   Chart review completed.  Goals addressed and updated.  Reviewed/discussed/updated care plan.    Reviewed/discussed/updated plan of care summary.  Education reinforced.  See patient care coordination note.  Discussed upcoming appointment dates and times.  Reinforced the importance of keeping all provider appointments.  Reinforced importance of taking all medications as prescribed.  Health maintenance reviewed/discussed/updated.  Confirmed patient has direct contact information and 24 hour nurse navigator number.   Encouraged Activity  Discussed Cardiac health   Pended needed scripts   Discussed mental health  Confirmed upcoming appointments     Case Manager To Do List for the Next Interaction:  Routine follow up  F/U Podiatry  F/U cardio  F/U PCP    Instructed patient to call with change in symptoms or as needed prior to next follow up.      Katina Parlor, RN

## 2023-09-06 ENCOUNTER — Ambulatory Visit (HOSPITAL_COMMUNITY): Payer: Self-pay

## 2023-09-06 DIAGNOSIS — F338 Other recurrent depressive disorders: Secondary | ICD-10-CM

## 2023-09-06 NOTE — Nursing Note (Signed)
 POPULATION HEALTH  Behavioral Health Integration - Psychiatric Collaborative Care - Follow Up    BHI Enrollment Date Patient was enrolled in BHI on 04/14/2023  9:51 AM.    PCP Karey Otter, DO   Behavioral Health Case Manager Luanna Rung, MSW  Collaborating Psychiatrist Helene Loader, MD     ASSESSMENT/ PLAN:     MSW spoke with patient via telephone this date. He reports overall doing well. He is [facility] the [facility] center [facility] and has enjoyed playing cards and making new friends. Light therapy discussed, packing up in May as recommended by Dr. Annabell Key, patient reported "makes sense".  MSW and patient discussed relapse prevention.      Brief Intervention(s):   Emotional Support  Follow with regular PHQ-9     Patient Goals:    Goals Addressed                   This Visit's Progress     Anxiety Symptoms Monitored and Managed   On track     Evidence-based guidance:   Explore treatment options in a youth-friendly atmosphere of hope and optimism [e.g., cognitive behavioral therapy, pharmacologic therapy, adjunct therapy (applied relaxation, mindfulness, meditation)].   Encourage ?obooster? sessions of cognitive behavioral therapy to prevent relapse that may occur 6 to 24 months after treatment has concluded.   Prepare child and caregiver for pharmacologic therapy (e.g., benzodiazepine) as short-term treatment to alleviate severe symptoms while allotting time for anxiolytic medication to become therapeutic, as well as allow for engagement    in treatment.   Prepare patient for short-term pharmacologic therapy when symptoms become debilitating (e.g., selective serotonin-reuptake inhibitor, serotonin-norepinephrine-reuptake inhibitor, tricyclic antidepressant).   Anticipate referral to behavioral health specialist or psychiatry when first-line treatments are not effective, anxiety is complicated by alcohol or substance use, symptoms interfere with social functioning, secondary depression or     suicidality occurs.   Consider parent-based training when child participation in cognitive behavioral therapy is not possible or child is not responding to treatment.   Address barriers to treatment that may include long wait times, delays in scheduling appointments, lack of pediatric mental health providers, transportation, inadequate or no insurance or stigma associated with mental health    disorders and treatment.    Notes:                 SUBJECTIVE:    Current Behavioral Health History:  Behavioral Health Care Management - Encounter Level:   Recent Suicidal Thoughts?: NONE  Recent Homicidal Thoughts?: denies homicidal ideation  Psychiatric Medication Assessment:  Brief Interventions Provided this Encounter: Emotional Support  Safety risk related to mental health?: Neg          OBJECTIVE:  Validated Measures:       06/07/2023    11:03 AM 07/15/2023    10:00 AM 08/18/2023    11:00 AM   Most Recent PHQ-9 Scores   PHQ 9 Total 8 5 0       Little interest or pleasure in doing things.: 0  Feeling down, depressed, or hopeless: 0  PHQ 2 Total: 0  Trouble falling or staying asleep, or sleeping too much.: 0  Feeling tired or having little energy: 0  Poor appetite or overeating: 0  Feeling bad about yourself/ that you are a failure in the past 2 weeks?: 0  Trouble concentrating on things in the past 2 weeks?: 0  Moving/Speaking slowly or being fidgety or restless  in the past 2  weeks?: 0  Thoughts that you would be better off DEAD, or of hurting yourself in some way.: 0  If you checked off any problems, how difficult have these problems made it for you to do your work, take care of things at home, or get along with other people?: Not difficult at all  PHQ 9 Total: 0  Interpretation of Total Score: Mild depression          Ongoing and Updated Assessment Interventions:  PHQ9      Case Manager To Do List for the Next Interaction:   Routine follow up  Assess for readiness for graduation      Plan to call patient in ~ one month  to reassess and update plan of care.  Instructed patient to call with change in symptoms or as needed prior to next follow up.       Luanna Rung, MSW  09/06/2023, 11:20  Behavioral Health Case Manager

## 2023-09-08 ENCOUNTER — Other Ambulatory Visit: Payer: Self-pay

## 2023-09-08 NOTE — Progress Notes (Signed)
 This patient is appearing on a report for being at risk of failing the adherence measure for cholesterol (statin) and diabetes medications this calendar year.   Medication: Empagliflozin 10mg  daily Last fill date: 10/02/2022 for 90 day supply  Medication: Atorvastatin 80mg  daily  Last fill date: 11/28/2022 for 30 day supply   Reviewed medication indication, dosing, and goals of therapy.  Patient obtaining medications through [facility] mail order.  No intervention needed.   Verdene Rio, PharmD PGY1 Pharmacy Resident

## 2023-09-09 ENCOUNTER — Encounter (INDEPENDENT_AMBULATORY_CARE_PROVIDER_SITE_OTHER): Payer: Self-pay | Admitting: PHYSICIAN ASSISTANT

## 2023-09-10 ENCOUNTER — Telehealth (INDEPENDENT_AMBULATORY_CARE_PROVIDER_SITE_OTHER): Payer: Self-pay

## 2023-09-10 NOTE — Nursing Note (Signed)
 Spoke with patient and moved his appointment to 4/14 with Ludwig Safer per his request

## 2023-09-13 ENCOUNTER — Ambulatory Visit: Admitting: Internal Medicine

## 2023-09-13 ENCOUNTER — Ambulatory Visit (INDEPENDENT_AMBULATORY_CARE_PROVIDER_SITE_OTHER): Admitting: Internal Medicine

## 2023-09-13 ENCOUNTER — Ambulatory Visit (INDEPENDENT_AMBULATORY_CARE_PROVIDER_SITE_OTHER): Payer: Self-pay | Admitting: Family Medicine

## 2023-09-13 ENCOUNTER — Other Ambulatory Visit (INDEPENDENT_AMBULATORY_CARE_PROVIDER_SITE_OTHER): Payer: Self-pay

## 2023-09-13 ENCOUNTER — Encounter: Payer: Self-pay | Admitting: Internal Medicine

## 2023-09-13 VITALS — BP 140/72 | HR 76 | Temp 98.1°F | Ht 64.0 in | Wt 220.0 lb

## 2023-09-13 DIAGNOSIS — N1832 Chronic kidney disease, stage 3b: Secondary | ICD-10-CM | POA: Diagnosis not present

## 2023-09-13 DIAGNOSIS — T8453XD Infection and inflammatory reaction due to internal right knee prosthesis, subsequent encounter: Secondary | ICD-10-CM

## 2023-09-13 DIAGNOSIS — L02415 Cutaneous abscess of right lower limb: Secondary | ICD-10-CM | POA: Insufficient documentation

## 2023-09-13 DIAGNOSIS — L03115 Cellulitis of right lower limb: Secondary | ICD-10-CM | POA: Diagnosis not present

## 2023-09-13 DIAGNOSIS — N2889 Other specified disorders of kidney and ureter: Secondary | ICD-10-CM | POA: Diagnosis not present

## 2023-09-13 MED ORDER — SULFAMETHOXAZOLE-TRIMETHOPRIM 800-160 MG PO TABS: 1.0000 | ORAL_TABLET | Freq: Two times a day (BID) | ORAL | 0 refills | Status: AC

## 2023-09-13 NOTE — Assessment & Plan Note (Signed)
 Last reading at 3b stage. He was unable to get an IV and contrast due to technical skill so reordered without IV contrast.

## 2023-09-13 NOTE — Progress Notes (Signed)
   Subjective:   Patient ID: Tanner [name], male    DOB: 1945-12-20, 78 y.o.   MRN: [id number]  HPI The patient is a 78 YO man coming in for leg swelling and redness. Started 2 weeks ago. Weight is stable. Still taking torsemide and this is causing frequent urination. Is still taking doxycycline which was recommended lifelong. Knee is still weak and not that functional which makes getting around difficult.   Review of Systems  Constitutional: Negative.   HENT: Negative.    Eyes: Negative.   Respiratory:  Negative for cough, chest tightness and shortness of breath.   Cardiovascular:  Positive for leg swelling. Negative for chest pain and palpitations.  Gastrointestinal:  Negative for abdominal distention, abdominal pain, constipation, diarrhea, nausea and vomiting.  Musculoskeletal:  Positive for arthralgias and joint swelling.  Skin: Negative.   Neurological:  Positive for weakness.  Psychiatric/Behavioral: Negative.      Objective:  Physical Exam Constitutional:      Appearance: He is well-developed.  HENT:     Head: Normocephalic and atraumatic.  Cardiovascular:     Rate and Rhythm: Normal rate and regular rhythm.  Pulmonary:     Effort: Pulmonary effort is normal. No respiratory distress.     Breath sounds: Normal breath sounds. No wheezing or rales.  Abdominal:     General: Bowel sounds are normal. There is no distension.     Palpations: Abdomen is soft.     Tenderness: There is no abdominal tenderness. There is no rebound.  Musculoskeletal:        General: Tenderness present.     Cervical back: Normal range of motion.     Right lower leg: Edema present.     Comments: Right leg with 2-3+ edema to knee and including knee, Left leg with minimal edema. Redness on the right lower leg.   Skin:    General: Skin is warm and dry.  Neurological:     Mental Status: He is alert and oriented to person, place, and time.     Coordination: Coordination abnormal.     Vitals:    09/13/23 1103  BP: (!) 140/72  Pulse: 76  Temp: 98.1 F (36.7 C)  SpO2: (!) 86%  Weight: 220 lb (99.8 kg)  Height: 5\' 4"  (1.626 m)    Assessment & Plan:  Visit time 20 minutes in face to face communication with patient and coordination of care, additional 15 minutes spent in record review, coordination or care, ordering tests, communicating/referring to other healthcare professionals, documenting in medical records all on the same day of the visit for total time 35 minutes spent on the visit.

## 2023-09-13 NOTE — Assessment & Plan Note (Signed)
 With significant swelling and redness. Rx bactrim 2 week course. If not improved will need close follow up. He had MRSA infection right knee and saw ortho Feb 2025 last. He was recommended lifelong doxycyline by ID but ortho was considering stopping in 1-2 months. His functional status is not great. He is taking eliquis and has not missed any doses making DVT unlikely.

## 2023-09-13 NOTE — Assessment & Plan Note (Signed)
 There is more redness and swelling in the right leg today. Rx bactrim 2 week course. If no improvement or worsening may need visit with orthopedics for fluid collection from the knee. I do recommend to continue doxycycline long term.

## 2023-09-13 NOTE — Patient Instructions (Signed)
 We will get the CT scan of the stomach without contrast to check the kidneys.  We will send in bactrim to take 1 pill twice a day for 2 weeks to help the right leg.

## 2023-09-13 NOTE — Assessment & Plan Note (Signed)
 He did try to get scan done about 1 month ago but IV failed and this was never rescheduled. Given that we want to know about size progression will order CT abdomen without contrast and likely does not need oral either (we are just looking to characterize the size of the renal mass).

## 2023-09-13 NOTE — Progress Notes (Signed)
 Behavioral Health Integration --Last encounter this month  Behavioral Health Integration - [facility]  Status: Enrolled  Effective Dates: 04/14/2023 - present  Responsible Staff: Luanna Rung, MSW      BHI Care Team:   Primary Care Provider Karey Otter, DO  Behavioral Health Case Manager Luanna Rung, MSW  Collaborating Psychiatrist Helene Loader, MD    Behavioral Health Integration Psychiatric Collaborative Care services have been performed by behavioral health case manager during this calendar month under the supervision of primary care provider Karey Otter, DO for behavioral health condition(s):  (F33.8) Seasonal depression (CMS [facility])  (primary encounter diagnosis)      The patient hasbeen added to a patient roster for weekly case load review with collaborating psychiatrist.  [name] treatment plan and status has been reviewed in weekly case conference with collaborating psychiatrist.    Brief interventions provided by case manager during this calendar month: emotional support    Validated Measures used to monitor patient progress during this calendar month:      06/07/2023    11:03 AM 07/15/2023    10:00 AM 08/18/2023    11:00 AM   Most Recent PHQ-9 Scores   PHQ 9 Total 8 5 0           04/14/2023    10:00 AM   Most Recent GAD-7 Scores   Gad-7 Score Total 4           Total BHI time spent this month is 17 minutes.     Luanna Rung, MSW  09/13/2023, 12:17

## 2023-09-13 NOTE — Progress Notes (Signed)
 Dr. Karey Otter, DO  ,    This patient has met the requirements to bill for Chronic Care Management services this month. Please sign this encounter as you typically would any other encounter. There is NO SPECIAL ATTESTATION needed . COSIGN ONLY. Please let us  know if you have any questions at all.    Chronic Care Management Time Documentation on 09/13/2023 13:09.   Time spent during current encounter is 1 minutes.   Cumulative time during current month's episode (month-to-date) is 45 minutes.          ICD-10-CM    1. Essential hypertension  I10       2. Encounter for counseling for care management of patient with chronic conditions and complex health needs using nurse-based model  Z71.89       3. Mixed hyperlipidemia  E78.2             acetaminophen (TYLENOL) 500 mg Oral Tablet, Take 1 Tablet (500 mg total) by mouth Every 4 hours as needed for Pain (pt takes tylenol pm)  alfuzosin  (UROXATRAL ) 10 mg Oral Tablet Sustained Release 24 hr, Take 1 Tablet (10 mg total) by mouth Once a day  apixaban  (ELIQUIS ) 5 mg Oral Tablet, Take 1 Tablet (5 mg total) by mouth Twice daily SHIP TO PATIENT,   DEA-[id number]  STATE LICENSE- [id number]  NPI- [id number]  clotrimazole  (MYCELEX ) 10 mg Mucous Membrane Troche, Take 1 Troche (10 mg total) by mouth Three times a day  diltiazem  HCl (TIAZAC ) 240 mg Oral Capsule,Sustained Action 24 hr, Take 1 Capsule (240 mg total) by mouth Daily  finasteride  (PROSCAR ) 5 mg Oral Tablet, Take 1 Tablet (5 mg total) by mouth Once a day  flecainide  (TAMBOCOR ) 100 mg Oral Tablet, Take 1 Tablet (100 mg total) by mouth Twice daily  furosemide  (LASIX ) 40 mg Oral Tablet, Take 1 Tablet (40 mg total) by mouth Once a day  losartan  (COZAAR ) 50 mg Oral Tablet, Take 1 Tablet (50 mg total) by mouth Once a day  nitroGLYCERIN  (NITROSTAT ) 0.4 mg Sublingual Tablet, Sublingual, Place 1 Tablet (0.4 mg total) under the tongue Every 5 minutes as needed for Chest pain  nystatin  (MYCOSTATIN ) 100,000 unit/mL Oral Suspension, Take  5 mL by mouth Four times a day  potassium chloride  (KLOR-CON ) 10 mEq Oral Tablet Sustained Release, Take 1 Tablet (10 mEq total) by mouth Once a day  rosuvastatin  (CRESTOR ) 10 mg Oral Tablet, Take 1 Tablet (10 mg total) by mouth Every evening  SPIRIVA  WITH HANDIHALER 18 mcg Inhalation Capsule, w/Inhalation Device, Take 1 Capsule (18 mcg total) by inhalation Once a day    No facility-administered medications prior to visit.        Thanks,  Your Chronic Care Management Team

## 2023-09-14 ENCOUNTER — Encounter: Payer: Self-pay | Admitting: Internal Medicine

## 2023-09-15 ENCOUNTER — Other Ambulatory Visit

## 2023-09-17 ENCOUNTER — Other Ambulatory Visit (INDEPENDENT_AMBULATORY_CARE_PROVIDER_SITE_OTHER): Payer: Self-pay | Admitting: Family Medicine

## 2023-09-17 ENCOUNTER — Other Ambulatory Visit (INDEPENDENT_AMBULATORY_CARE_PROVIDER_SITE_OTHER): Payer: Self-pay | Admitting: Physician Assistant

## 2023-09-17 NOTE — Telephone Encounter (Signed)
 Last Visit:06/07/23  Upcoming appointments: 10/11/2023           Dorenda Gandy, MA  09/17/2023, 09:55

## 2023-09-26 DIAGNOSIS — J9601 Acute respiratory failure with hypoxia: Secondary | ICD-10-CM | POA: Diagnosis not present

## 2023-09-27 ENCOUNTER — Ambulatory Visit: Payer: Self-pay | Attending: Medical | Admitting: Medical

## 2023-09-27 ENCOUNTER — Encounter (INDEPENDENT_AMBULATORY_CARE_PROVIDER_SITE_OTHER): Payer: Self-pay | Admitting: Medical

## 2023-09-27 ENCOUNTER — Other Ambulatory Visit: Payer: Self-pay

## 2023-09-27 VITALS — BP 136/80 | HR 50 | Ht 70.0 in | Wt 251.0 lb

## 2023-09-27 DIAGNOSIS — E782 Mixed hyperlipidemia: Secondary | ICD-10-CM | POA: Insufficient documentation

## 2023-09-27 DIAGNOSIS — I4892 Unspecified atrial flutter: Secondary | ICD-10-CM | POA: Insufficient documentation

## 2023-09-27 DIAGNOSIS — I10 Essential (primary) hypertension: Secondary | ICD-10-CM | POA: Insufficient documentation

## 2023-09-27 NOTE — Progress Notes (Signed)
 [facility]  CARDIOLOGY, Damita Dull  [address]  [place] [address]  [phone]    Thomas [name] is a 78 y.o. male seen in the office on 09/27/2023    Chief Complaint   Patient presents with    Cardiac Care Follow Up         Patient Active Problem List   Diagnosis    Atrial flutter    Body mass index (BMI) of 33.0 to 33.9 in adult    Essential hypertension    Hyperlipidemia    Left bundle branch hemiblock    Paroxysmal atrial flutter (CMS HCC)    History of cardioversion    History of cardiac cath-Overall normal coronary anatomy 12/26/2018    Cardiac left ventricular ejection fraction 55% on TTE 12/24/21    Acute pain of left shoulder       History of Present Illness: This is 78 year old male patient with a past medical history of paroxysmal atrial flutter/fib with history of TEE/DCCV and reoccurrence atrial fibrillation /flutter (now rate controlled), hypertension, hyperlipidemia, gout, and and chronic obstructive pulmonary disease.  Last EKG 03/24/2023 showing sinus bradycardia with first-degree AV block and PACs at 52 beats per minute.    He comes in now for routine follow-up.  He has a journal fist recent blood pressure values.  Patient tells me that he has had some fluctuations over the course of the last month.  He is compliant with his medications however not compliant with dosing schedule.  Depending on his work schedule and activities he may not take his  medications on time.  He continues salt his foods.  He denies headache, tinnitus or visual disturbance.  He reports no chest pain, palpitations or shortness of breath.  He requires no refills at this time.  Current Outpatient Medications   Medication Sig    acetaminophen (TYLENOL) 500 mg Oral Tablet Take 1 Tablet (500 mg total) by mouth Every 4 hours as needed for Pain (pt takes tylenol pm)    alfuzosin (UROXATRAL) 10 mg Oral Tablet Sustained Release 24 hr Take 1 Tablet (10 mg total) by mouth Once a day     apixaban (ELIQUIS) 5 mg Oral Tablet Take 1 Tablet (5 mg total) by mouth Twice daily SHIP TO PATIENT,   DEA-[id number]  STATE LICENSE- [id number] L  NPI- [id number]    clotrimazole (MYCELEX) 10 mg Mucous Membrane Troche Take 1 Troche (10 mg total) by mouth Three times a day    diltiazem HCl (TIAZAC) 240 mg Oral Capsule,Sustained Action 24 hr Take 1 Capsule (240 mg total) by mouth Daily    finasteride (PROSCAR) 5 mg Oral Tablet Take 1 Tablet (5 mg total) by mouth Once a day    flecainide (TAMBOCOR) 100 mg Oral Tablet Take 1 Tablet (100 mg total) by mouth Twice daily    furosemide (LASIX) 40 mg Oral Tablet TAKE 1 TABLET ONE TIME DAILY    losartan (COZAAR) 50 mg Oral Tablet Take 1 Tablet (50 mg total) by mouth Once a day    nitroGLYCERIN (NITROSTAT) 0.4 mg Sublingual Tablet, Sublingual Place 1 Tablet (0.4 mg total) under the tongue Every 5 minutes as needed for Chest pain    nystatin (MYCOSTATIN) 100,000 unit/mL Oral Suspension Take 5 mL by mouth Four times a day    potassium chloride (KLOR-CON) 10 mEq Oral Tablet Sustained Release Take 1 Tablet (10 mEq total) by mouth Once a day    rosuvastatin (CRESTOR) 10 mg Oral Tablet Take 1  Tablet (10 mg total) by mouth Every evening    SPIRIVA WITH HANDIHALER 18 mcg Inhalation Capsule, w/Inhalation Device Take 1 Capsule (18 mcg total) by inhalation Once a day       No Known Allergies    Family Medical History:       Problem Relation (Age of Onset)    Cancer Mother    Hypertension (High Blood Pressure) Mother, Father              Social History     Socioeconomic History    Marital status: Widowed   Tobacco Use    Smoking status: Former     Types: Cigars    Smokeless tobacco: Current     Types: Chew, Snuff   Vaping Use    Vaping status: Never Used   Substance and Sexual Activity    Alcohol use: Yes     Alcohol/week: 2.0 standard drinks of alcohol     Types: 2 Cans of beer per week     Comment: few days a week    Drug use: Never    Sexual activity: Not Currently     Social  Determinants of Health     Financial Resource Strain: Low Risk  (09/27/2023)    Financial Resource Strain     SDOH Financial: No   Transportation Needs: Low Risk  (09/27/2023)    Transportation Needs     SDOH Transportation: No   Social Connections: Medium Risk (09/27/2023)    Social Connections     SDOH Social Isolation: 3 to 5 times a week   Intimate Partner Violence: Low Risk  (09/27/2023)    Intimate Partner Violence     SDOH Domestic Violence: No   Housing Stability: Low Risk  (09/27/2023)    Housing Stability     SDOH Housing Situation: I have housing.     SDOH Housing Worry: No         Physical Exam:  BP 136/80 (Site: Left Arm, Patient Position: Sitting)   Pulse 50   Ht 1.778 m (5\' 10" )   Wt 114 kg (251 lb)   SpO2 91%   BMI 36.01 kg/m       Body mass index is 36.01 kg/m.  Wt Readings from Last 5 Encounters:   09/27/23 114 kg (251 lb)   07/19/23 115 kg (254 lb)   07/12/23 117 kg (257 lb 3.2 oz)   07/08/23 117 kg (259 lb)   06/28/23 118 kg (259 lb 9.6 oz)     The patient is in no distress. Skin is warm and dry. There is no neck vein distention. No carotid bruits heard. The lungs are clear bilaterally. The heart is regular with a grade 1-2/6 systolic murmur.  The abdomen is obese, normoactive, soft and non-tender. The aorta is not palpable. Extremities show no significant edema.  Distal pulses are intact.      PERTINENT DIAGNOSTICS:  Last echocardiogram completed  Last BMP  (Last result in the past 2 years)        Na   K   Cl   CO2   BUN   Cr   Calcium   Glucose   Glucose-Fasting        04/23/23 0658 146   4.0   108   31   19   0.85   8.9  Comment: Gadolinium-containing contrast can interfere with calcium measurement.     96  Last CBC  (Last result in the past 2 years)        WBC   HGB   HCT   MCV   Platelets      04/23/23 0658 6.3   14.9   44.7   95.7   144             Last Lipid Panel  (Last result in the past 2 years)        Cholesterol   HDL   LDL   Direct LDL   Triglycerides      04/23/23  0658 118   59   48  Comment: <100 mg/dL, Optimal  161-096 mg/dL, Near/Above Optimal  045-409 mg/dL, Borderline High  811-914 mg/dL, High  >=782 mg/dL, Very high     42             Lab Results   Component Value Date    TSH 1.665 05/01/2022      Transthoracic echocardiogram completed 12/24/2021  Technical Quality:: The study images were of technically adequate quality.  Quality: The study images were of technically adequate quality.     Reason For Study: Atrial flutter, unspecified type (CMS HCC),Essential hypertension,DOE (dyspnea on exertion)     Conclusions  NSR at HR of 40s throughout.  Normal biventricular size and function.  Normal atrial sizes.  No significant valvular pathology is noted.  Other findings as below.  No previous echo images are available for comparison     Findings:  Procedure: Transthoracic complete echo, 2D, spectral and tissue Doppler, color flow Doppler, M-mode.  Left Ventricle: Normal left ventricular size. Normal geometry. Mild left ventricular hypertrophy. Left ventricular systolic function is normal. The left ventricular ejection fraction by visual  assessment is estimated to be 55%. No segmental/regional wall motion abnormalities identified. Left ventricular diastolic parameters are normal.  Right Ventricle: Normal right ventricular size. Normal right ventricular systolic function. Right ventricular systolic pressure is normal. Right ventricular systolic pressure is .  Left Atrium: The left atrium is normal in size.  Right Atrium: The right atrium is of normal size.  Mitral valve: Mild mitral annular calcification. There is mild mitral regurgitation.  Tricuspid valve: The tricuspid valve is normal. Trace tricuspid regurgitation present.  Aortic valve: The aortic valve is normal. Trileaflet aortic valve.  Pulmonic valve: The pulmonic valve is normal. Trace pulmonic valve regurgitation present.  Pulmonary Artery:: The pulmonary artery appears normal.  Atrial Septum: The interatrial  septum is normal in appearance.  IVC: Normal IVC size with >50% inspiratory collapse (estimated RA pressure 3 mmHg).  Aorta: The aortic root is of normal size.  Pericardium: Normal pericardium with no pericardial effusion.    A 7 day event monitor ordered incomplete 06/11/2021 due to symptoms of "heart flutters" revealed rare ventricular ectopic beats with no significant complex ectopy.  No cardiac arrhythmias we were identified.  Minimal heart rate was 40 beats per minute with maximum heart rate of 82 beats per minute and average heart rate 53.      Left heart catheterization 12/26/2018  1. Left main short and normal   2. Lad proximal normal size and normal distal vessel very small  3. Diagonals normal   4. AV groove circumflex is normal   5. Obtuse marginal normal left circumflex is codominant vessel   6. Right codominant vessel is normal   7. LV-gram shows EF of 50%.    IMPRESSION:  Assessment/Plan   1. Paroxysmal atrial flutter (CMS HCC)  2. Mixed hyperlipidemia    3. Essential hypertension            PLAN:    1. Paroxysmal atrial flutter (CMS HCC) (Primary)  -Patient appears to be maintaining sinus rhythm at this time.  -continue Tambocor 100 mg b.i.d., anti Tiazac 240 mg daily  -anticoagulated with Eliquis 5 mg b.i.d..  Reports no signs or symptoms of bleeding with use.  He is well educated on signs and symptoms of bleeding as well as steps to take should bleeding occur.    2. Mixed hyperlipidemia  -recent labs reviewed; LDL C well-controlled  -continue Crestor 10 mg nightly    3. Essential hypertension  BP slightly elevated today at 136/80.  Recommendations for patient to discontinue exogenous dietary salt and avoid over-the-counter NSAIDs.  I will have patient monitor his blood pressure over the next 2 weeks and phone our office should he maintain systolic readings above 130 or diastolic readings above 90; we will adjust medications accordingly if needed.    -Continue Tiazac 240 mg daily, Cozaar 50 mg  daily Lasix 40 mg daily    As patient continues have fluctuating blood pressure values however I feel this is secondary to his diet and continue use of salt, I have educated patient on the need to reduce/remove these steps rates from his diet.  I have additionally educated patient on the avoidance of over-the-counter NSAIDs.  He verbalized understanding.  He will monitor his blood pressures as noted above and phone our office should he continue to have elevated pressures after adjustments in diet.  No adjustments have been made to his current cardiac therapies.  We will see him back in 6 months or sooner if needed.  He will phone with any questions or concerns.  Dr. Francie Massing was readily available for discussion with regard to this patient's care.    No orders of the defined types were placed in this encounter.          Cardiology, [facility]  [facility], [place]  [address]  [place] [address]  [phone] Primary Care, [facility], [place]  [address]  [place] [address]  [phone] Pulmonary, [facility], [place]  [address]  [place] [address]  [phone]       Albin Fischer, PA-C

## 2023-09-29 ENCOUNTER — Ambulatory Visit: Payer: Self-pay

## 2023-09-29 NOTE — Telephone Encounter (Signed)
 Chief Complaint: swelling leg Symptoms: swelling and redness Frequency: over 3 weeks Pertinent Negatives: Patient denies fever Disposition: [] ED /[] Urgent Care (no appt availability in office) / [] Appointment(In office/virtual)/ []  [facility]/ [] Home Care/ [] Refused Recommended Disposition /[] [facility]/ [x]  Follow-up with PCP Additional Notes: PT called and states pt leg  Is not any better than before states it looks the exact same as before and pt state that pain is 3/10.  PT states that some way the pt was not been taking his doxycyline as it got out of his medication rotation and she doesn't know for how long but he does have a bottle and she will had it back to his fill case starting back tonight. Did not book appt as will need to reach out to his son to schedule appt and PT wanted to know if he just needs to restart doxycyline and then follow up or you would like to see him.   Copied from CRM [id number]. Topic: Clinical - Red Word Triage >> Sep 29, 2023  3:34 PM Karole Pacer C wrote: Red Word that prompted transfer to Nurse Triage: Patient was on antibiotic for leg patient was seen on 3/31 patients leg is still larger than the other. It inflamed swollen and red causing the patient pain. Reason for Disposition  [1] MILD swelling of both ankles (i.e., pedal edema) AND [2] new-onset or worsening  Answer Assessment - Initial Assessment Questions 1. ONSET: "When did the swelling start?" (e.g., minutes, hours, days)    Last month 2. LOCATION: "What part of the leg is swollen?"  "Are both legs swollen or just one leg?"     Right leg  3. SEVERITY: "How bad is the swelling?" (e.g., localized; mild, moderate, severe)   - Localized: Small area of swelling localized to one leg.   - MILD pedal edema: Swelling limited to foot and ankle, pitting edema < 1/4 inch (6 mm) deep, rest and elevation eliminate most or all swelling.   - MODERATE edema: Swelling of lower leg to knee, pitting  edema > 1/4 inch (6 mm) deep, rest and elevation only partially reduce swelling.   - SEVERE edema: Swelling extends above knee, facial or hand swelling present.      1-2 inches bigger than other leg 4. REDNESS: "Does the swelling look red or infected?"     Red  and infected 5. PAIN: "Is the swelling painful to touch?" If Yes, ask: "How painful is it?"   (Scale 1-10; mild, moderate or severe)     3/10 6. FEVER: "Do you have a fever?" If Yes, ask: "What is it, how was it measured, and when did it start?"      no 7. CAUSE: "What do you think is causing the leg swelling?"     unknown 10. OTHER SYMPTOMS: "Do you have any other symptoms?" (e.g., chest pain, difficulty breathing)       Leg pain  Protocols used: Leg Swelling and Edema-A-AH

## 2023-09-30 ENCOUNTER — Ambulatory Visit (HOSPITAL_COMMUNITY): Payer: Self-pay

## 2023-09-30 DIAGNOSIS — F338 Other recurrent depressive disorders: Secondary | ICD-10-CM

## 2023-09-30 NOTE — Nursing Note (Signed)
 Behavioral Health Integration--Call Attempt  Patient was enrolled in BHI on 04/14/2023  9:51 AM.   The patient is   in BHI.    BHI Care Team:   Primary Care Provider Karey Otter, DO  Behavioral Health Case Manager Luanna Rung, MSW  Collaborating Psychiatrist Helene Loader, MD     This MSW attempted to contact patient. Patient unreachable at this time. MSW left message with return contact information.     Luanna Rung, MSW  09/30/2023, 12:13  Behavioral Health Case Manager

## 2023-10-01 ENCOUNTER — Ambulatory Visit (HOSPITAL_COMMUNITY): Payer: Self-pay

## 2023-10-01 DIAGNOSIS — F338 Other recurrent depressive disorders: Secondary | ICD-10-CM

## 2023-10-01 NOTE — Nursing Note (Signed)
 Behavioral Health Integration--Brief Note  BHI Enrollment Date Patient was enrolled in BHI on 04/14/2023  9:51 AM.     PCP Karey Otter, DO   Collaborating Psychiatrist: Helene Loader, MD  Behavioral Health Case Manager: Luanna Rung, MSW         MSW received voicemail from patient returning MSW call. MSW attempted call back, left message.   Total BHI time spent this encounter is   minutes.    Luanna Rung, MSW  10/01/2023, 09:31  Behavioral Health Case Manager

## 2023-10-04 ENCOUNTER — Other Ambulatory Visit (INDEPENDENT_AMBULATORY_CARE_PROVIDER_SITE_OTHER): Payer: Self-pay

## 2023-10-04 DIAGNOSIS — Z7189 Other specified counseling: Secondary | ICD-10-CM

## 2023-10-04 NOTE — Telephone Encounter (Signed)
**Note De-identified  [place] Obfuscation** Please advise 

## 2023-10-04 NOTE — Telephone Encounter (Signed)
 Patient needs to be seen.

## 2023-10-04 NOTE — Telephone Encounter (Signed)
 What did the RN recommended?

## 2023-10-04 NOTE — Telephone Encounter (Signed)
 It doesn't look like RN had made any recommendation to the patient. Only wanted to know if patient need to be seen again? Or can have another round of antibiotics.

## 2023-10-04 NOTE — Nursing Note (Signed)
 POPULATION HEALTH    DISEASE MANAGEMENT COORDINATOR    [facility] called patient for monthly follow up. Patient did not answer. [facility] left voicemail with contact information. [facility] will contact patient tomorrow for a second call.    Katina Parlor, RN, BSN  Disease Management Coordinator  [employer]  [phone]

## 2023-10-05 ENCOUNTER — Other Ambulatory Visit (INDEPENDENT_AMBULATORY_CARE_PROVIDER_SITE_OTHER): Payer: Self-pay

## 2023-10-05 DIAGNOSIS — Z7189 Other specified counseling: Secondary | ICD-10-CM

## 2023-10-05 NOTE — Nursing Note (Signed)
 POPULATION HEALTH    DISEASE MANAGEMENT COORDINATOR    [facility] called patient for monthly follow up. Patient did not answer . [facility] left voicemail for patient with contact information. [facility] will make another call prior to month end.       Katina Parlor, RN, BSN  Disease Management Coordinator  [employer]  [phone]

## 2023-10-06 ENCOUNTER — Ambulatory Visit (HOSPITAL_COMMUNITY): Payer: Self-pay

## 2023-10-06 ENCOUNTER — Ambulatory Visit (INDEPENDENT_AMBULATORY_CARE_PROVIDER_SITE_OTHER): Payer: Self-pay

## 2023-10-06 DIAGNOSIS — F338 Other recurrent depressive disorders: Secondary | ICD-10-CM

## 2023-10-06 NOTE — Nursing Note (Signed)
 POPULATION HEALTH  Behavioral Health Integration - Psychiatric Collaborative Care - Follow Up    BHI Enrollment Date Patient was enrolled in BHI on 04/14/2023  9:51 AM.    PCP Karey Otter, DO   Behavioral Health Case Manager Luanna Rung, MSW  Collaborating Psychiatrist Helene Loader, MD     ASSESSMENT/ PLAN:     MSW spoke with patient via telephone this date. He engages with ease. He has been busy with church, working outside, golf and going twice a week to play cards with some friends. Patient participated in PHQ9. Symptom stability and relapse prevention discussed. Patient did have a friend that passed last week that he spoke to MSW about. MSW offered psychoeducation and emotional support. Stages of grief were discussed.        Brief Intervention(s):      Follow with regular PHQ-9     Patient Goals:    Goals Addressed                   This Visit's Progress     Anxiety Symptoms Monitored and Managed   On track     Evidence-based guidance:   Explore treatment options in a youth-friendly atmosphere of hope and optimism [e.g., cognitive behavioral therapy, pharmacologic therapy, adjunct therapy (applied relaxation, mindfulness, meditation)].   Encourage ?obooster? sessions of cognitive behavioral therapy to prevent relapse that may occur 6 to 24 months after treatment has concluded.   Prepare child and caregiver for pharmacologic therapy (e.g., benzodiazepine) as short-term treatment to alleviate severe symptoms while allotting time for anxiolytic medication to become therapeutic, as well as allow for engagement    in treatment.   Prepare patient for short-term pharmacologic therapy when symptoms become debilitating (e.g., selective serotonin-reuptake inhibitor, serotonin-norepinephrine-reuptake inhibitor, tricyclic antidepressant).   Anticipate referral to behavioral health specialist or psychiatry when first-line treatments are not effective, anxiety is complicated by alcohol or substance use,  symptoms interfere with social functioning, secondary depression or    suicidality occurs.   Consider parent-based training when child participation in cognitive behavioral therapy is not possible or child is not responding to treatment.   Address barriers to treatment that may include long wait times, delays in scheduling appointments, lack of pediatric mental health providers, transportation, inadequate or no insurance or stigma associated with mental health    disorders and treatment.    Notes:                 SUBJECTIVE:    Current Behavioral Health History:  Behavioral Health Care Management - Encounter Level:   Psychiatric Medication Assessment:          OBJECTIVE:  Validated Measures:       07/15/2023    10:00 AM 08/18/2023    11:00 AM 10/06/2023     9:43 AM   Most Recent PHQ-9 Scores   PHQ 9 Total 5 0 0       Little interest or pleasure in doing things.: 0  Feeling down, depressed, or hopeless: 0  PHQ 2 Total: 0  Trouble falling or staying asleep, or sleeping too much.: 0  Feeling tired or having little energy: 0  Poor appetite or overeating: 0  Feeling bad about yourself/ that you are a failure in the past 2 weeks?: 0  Trouble concentrating on things in the past 2 weeks?: 0  Moving/Speaking slowly or being fidgety or restless  in the past 2 weeks?: 0  Thoughts that you would be better off DEAD, or  of hurting yourself in some way.: 0  If you checked off any problems, how difficult have these problems made it for you to do your work, take care of things at home, or get along with other people?: Not difficult at all  PHQ 9 Total: 0  Interpretation of Total Score: No depression          Ongoing and Updated Assessment Interventions:  PHQ9 and relapse prevention planning      Case Manager To Do List for the Next Interaction:   Routine follow up        Plan to call patient in ~ one month to reassess and update plan of care.  Instructed patient to call with change in symptoms or as needed prior to next follow up.        Luanna Rung, MSW  10/06/2023, 10:02  Behavioral Health Case Manager

## 2023-10-11 ENCOUNTER — Encounter (INDEPENDENT_AMBULATORY_CARE_PROVIDER_SITE_OTHER): Payer: Self-pay | Admitting: Family Medicine

## 2023-10-12 ENCOUNTER — Telehealth: Payer: Self-pay | Admitting: *Deleted

## 2023-10-12 ENCOUNTER — Ambulatory Visit: Payer: Self-pay

## 2023-10-12 ENCOUNTER — Ambulatory Visit (HOSPITAL_COMMUNITY): Payer: Self-pay

## 2023-10-12 ENCOUNTER — Other Ambulatory Visit: Payer: Self-pay | Admitting: Internal Medicine

## 2023-10-12 DIAGNOSIS — F338 Other recurrent depressive disorders: Secondary | ICD-10-CM

## 2023-10-12 NOTE — Progress Notes (Signed)
 Complex Care Management Care Guide Note  10/12/2023 Name: Tanner [name] MRN: [id number] DOB: 08/24/1945  Tanner [name] is a 78 y.o. year old male who is a primary care patient of Adelia Homestead, MD and is actively engaged with the care management team. I reached out to Baruch Bosch by phone today to assist with re-scheduling  with the RN Case Manager.  Follow up plan: Unsuccessful telephone outreach attempt made. A HIPAA compliant phone message was left for the patient providing contact information and requesting a return call.  Kandis Ormond, CMA [place]  [facility], [facility] Guide Direct Dial: [phone]  Fax: [phone] Website: [email or web address]

## 2023-10-12 NOTE — Telephone Encounter (Signed)
 Copied from CRM [id number]. Topic: Clinical - Medication Refill >> Oct 12, 2023  9:37 AM Tanner [name] wrote: Most Recent Primary Care Visit:  Provider: Bambi Lever A  Department: [facility]  Visit Type: OFFICE VISIT  Date: 09/13/2023  Medication: Prednisone , Lidocaine  (For jaw pain)  Has the patient contacted their pharmacy? Yes (Agent: If no, request that the patient contact the pharmacy for the refill. If patient does not wish to contact the pharmacy document the reason why and proceed with request.) (Agent: If yes, when and what did the pharmacy advise?)  Is this the correct pharmacy for this prescription? Yes If no, delete pharmacy and type the correct one.  This is the patient's preferred pharmacy:  [id number] [name], [place] - [address] [name] [address] Phone: [phone] Fax: [phone]   Has the prescription been filled recently? No  Is the patient out of the medication? Yes  Has the patient been seen for an appointment in the last year OR does the patient have an upcoming appointment? Yes  Can we respond through MyChart? Yes  Agent: Please be advised that Rx refills may take up to 3 business days. We ask that you follow-up with your pharmacy.

## 2023-10-12 NOTE — Telephone Encounter (Signed)
 Copied From CRM [id number]. Reason for Triage: Patient's brother called in 2x today regarding med refill for extreme jaw pain, follow up regarding scheduling if available. Patient not present at time of call.   Chief Complaint: Severe mouth pain Symptoms: Difficulty swallowing/not able to eat Frequency: This weekend-today Pertinent Negatives: Patient denies relief Disposition: [] ED /[x] Urgent Care (no appt availability in office) / [x] Appointment(In office/virtual)/ []  [facility]/ [] Home Care/ [x] Refused Recommended Disposition /[] [facility]/ []  Follow-up with PCP Additional Notes: This RN was able to connect with patient. Patient stated he is experiencing severe mouth pain. Patient stated pain onset over the weekend. Patient stated his pain is a 10 at this time. Patient stated he has not been able to eat or chew anything today. Patient denied difficulty breathing. Patient stated he was treated for this in the past with prednisone  and lidocaine . Patient is requesting those medications at this time. Advised patient to see a provider within 4 hours, given the amount of pain he's in. Patient declined and requested medications again. Advised I would route this conversation to office. Please advise.   Reason for Disposition  [1] SEVERE mouth pain (e.g., excruciating) AND [2] not improved after 2 hours of pain medicine  Answer Assessment - Initial Assessment Questions 1. ONSET: "When did the mouth start hurting?" (e.g., hours or days ago)      Over the weekend, states he has been treated for this before in the past  2. SEVERITY: "How bad is the pain?" (Scale 1-10; mild, moderate or severe)   - MILD (1-3):  doesn't interfere with eating or normal activities   - MODERATE (4-7): interferes with eating some solids and normal activities   - SEVERE (8-10):  excruciating pain, interferes with most normal activities   - SEVERE DYSPHAGIA: can't swallow liquids, drooling     Rates pain a  10 at this time 3. SORES: "Are there any sores or ulcers in the mouth?" If Yes, ask: "What part of the mouth are the sores in?"     Denies 4. FEVER: "Do you have a fever?" If Yes, ask: "What is your temperature, how was it measured, and when did it start?"     Low grade fever 5. CAUSE: "What do you think is causing the mouth pain?"     Unknown, states prednisone  and lidocaine  have helped in the past 6. OTHER SYMPTOMS: "Do you have any other symptoms?" (e.g., difficulty breathing)     Mild difficulty swallowing- states he hasn't been able to eat or chew anything, denies difficulty breathing  Protocols used: Mouth Pain-A-AH

## 2023-10-12 NOTE — Nursing Note (Signed)
 POPULATION HEALTH  Behavioral Health Integration - Psychiatric Case Review     Behavioral Health Integration - Psychiatric Collaborative Care   Behavioral Health Integration - BHI  Status: Enrolled  Effective Dates: 04/14/2023 - present  Responsible Staff: Luanna Rung, MSW      Behavioral Health Integration Care Team:  HYQ:MVHQI [place], DO  Behavioral Health Case Manager Luanna Rung, MSW  Collaborating Psychiatrist: Helene Loader, MD     ASSESSMENT/ PLAN: (Actions for PCP to Take)      NO change, Patient in relapse prevention and preparing for graduation.   Recommendations for PCP regarding pharmacologic management: See Dr. Annabell Key note for details. We recommend changes (if appropriate) be made within 5 business days for continuation of care for patient.     Recommendations for Case Manager (additional assessments or interventions  or linkages/referrals to consider): Graduation.         SUBJECTIVE/ OBJECTIVE:  Thomas [name]'s case was reviewed by collaborating psychiatrist and BH case manager today in a telephone-video conference call.  The collaborating psychiatrist was provided a summary of the patient's behavioral health by the case manager including results of validated measures compared to baseline. The psychiatrist reviewed health history and case manager notes in the medical record.     Diagnosis Review:   No suggestions/revisions to diagnosis made at this time.      Program Progression Review:   BHI Program Proression Review: Consider Graduation from [employer] [facility] Integration     Summary of Past Behavioral Health History:  Past Behavioral Health History:     Current Behavioral Health History:  Behavioral Health Care Management - Encounter Level:   Psychiatric Medication Assessment:          PHQ 9 Follow Up Questionnaire:            07/15/2023    10:00 AM 08/18/2023    11:00 AM 10/06/2023     9:43 AM   Most Recent PHQ-9 Scores   PHQ 9 Total 5 0 0           10/06/2023     9:43 AM   Most  Recent PHQ-9 Scores   PHQ 9 Total 0     Little interest or pleasure in doing things.: 0  Feeling down, depressed, or hopeless: 0  PHQ 2 Total: 0  Trouble falling or staying asleep, or sleeping too much.: 0  Feeling tired or having little energy: 0  Poor appetite or overeating: 0  Feeling bad about yourself/ that you are a failure in the past 2 weeks?: 0  Trouble concentrating on things in the past 2 weeks?: 0  Moving/Speaking slowly or being fidgety or restless  in the past 2 weeks?: 0  Thoughts that you would be better off DEAD, or of hurting yourself in some way.: 0  If you checked off any problems, how difficult have these problems made it for you to do your work, take care of things at home, or get along with other people?: Not difficult at all  PHQ 9 Total: 0  Interpretation of Total Score: No depression    Last Gad-7 Scale:            04/14/2023    10:00 AM   Most Recent GAD-7 Scores   Gad-7 Score Total 4          Patient Goals:    Goals Addressed    None          Total BHI time spent this  encounter is 3 minutes.     Total BHI time spent this month is 15 minutes.       Luanna Rung, MSW  10/12/2023, 09:42    I have read and reviewed the social worker's note. I agree with the current plan of care.    Helene Loader, MD

## 2023-10-13 ENCOUNTER — Other Ambulatory Visit: Payer: Self-pay | Admitting: Internal Medicine

## 2023-10-13 ENCOUNTER — Ambulatory Visit (INDEPENDENT_AMBULATORY_CARE_PROVIDER_SITE_OTHER): Payer: Self-pay | Admitting: Family Medicine

## 2023-10-13 ENCOUNTER — Other Ambulatory Visit (INDEPENDENT_AMBULATORY_CARE_PROVIDER_SITE_OTHER): Payer: Self-pay

## 2023-10-13 DIAGNOSIS — F338 Other recurrent depressive disorders: Secondary | ICD-10-CM

## 2023-10-13 DIAGNOSIS — E782 Mixed hyperlipidemia: Secondary | ICD-10-CM

## 2023-10-13 DIAGNOSIS — I10 Essential (primary) hypertension: Secondary | ICD-10-CM

## 2023-10-13 DIAGNOSIS — Z7189 Other specified counseling: Secondary | ICD-10-CM

## 2023-10-13 MED ORDER — PREDNISONE 20 MG PO TABS: 40.0000 mg | ORAL_TABLET | Freq: Every day | ORAL | 0 refills | Status: DC

## 2023-10-13 MED ORDER — LIDOCAINE VISCOUS HCL 2 % MT SOLN: 15.0000 mL | OROMUCOSAL | 0 refills | Status: DC | PRN

## 2023-10-13 NOTE — Telephone Encounter (Signed)
 Sent in prednisone  not sure what type of lidocaine  they are requesting the viscous or patches.

## 2023-10-13 NOTE — Telephone Encounter (Signed)
 I did sent in viscous

## 2023-10-13 NOTE — Telephone Encounter (Signed)
Schedule appointment for patient.

## 2023-10-13 NOTE — Progress Notes (Signed)
 Behavioral Health Integration --Last encounter this month  Behavioral Health Integration - BHI  Status: Enrolled  Effective Dates: 04/14/2023 - present  Responsible Staff: Luanna Rung, MSW      BHI Care Team:   Primary Care Provider Karey Otter, DO  Behavioral Health Case Manager Luanna Rung, MSW  Collaborating Psychiatrist Helene Loader, MD    Behavioral Health Integration Psychiatric Collaborative Care services have been performed by behavioral health case manager during this calendar month under the supervision of primary care provider Karey Otter, DO for behavioral health condition(s):  (F33.8) Seasonal depression (CMS [facility])  (primary encounter diagnosis)      The patient hasbeen added to a patient roster for weekly case load review with collaborating psychiatrist.  [name] treatment plan and status has been reviewed in weekly case conference with collaborating psychiatrist.    Brief interventions provided by case manager during this calendar month: emotional support and prep for relapse prevention.     Validated Measures used to monitor patient progress during this calendar month:      07/15/2023    10:00 AM 08/18/2023    11:00 AM 10/06/2023     9:43 AM   Most Recent PHQ-9 Scores   PHQ 9 Total 5 0 0           04/14/2023    10:00 AM   Most Recent GAD-7 Scores   Gad-7 Score Total 4           Total BHI time spent this month is 15 minutes.     Luanna Rung, MSW  10/13/2023, 13:21

## 2023-10-13 NOTE — Progress Notes (Signed)
 Dr. Karey Otter, DO  ,    This patient has met the requirements to bill for Chronic Care Management services this month. Please sign this encounter as you typically would any other encounter. There is NO SPECIAL ATTESTATION needed . COSIGN ONLY. Please let us  know if you have any questions at all.    Chronic Care Management Time Documentation on 10/13/2023 12:17.   Time spent during current encounter is 1 minutes.   Cumulative time during current month's episode (month-to-date) is 35 minutes.          ICD-10-CM    1. Seasonal depression (CMS HCC)  F33.8       2. Encounter for counseling for care management of patient with chronic conditions and complex health needs using nurse-based model  Z71.89       3. Essential hypertension  I10       4. Mixed hyperlipidemia  E78.2             acetaminophen (TYLENOL) 500 mg Oral Tablet, Take 1 Tablet (500 mg total) by mouth Every 4 hours as needed for Pain (pt takes tylenol pm)  alfuzosin  (UROXATRAL ) 10 mg Oral Tablet Sustained Release 24 hr, Take 1 Tablet (10 mg total) by mouth Once a day  apixaban  (ELIQUIS ) 5 mg Oral Tablet, Take 1 Tablet (5 mg total) by mouth Twice daily SHIP TO PATIENT,   DEA-[id number]  STATE LICENSE- [id number]  NPI- [id number]  clotrimazole  (MYCELEX ) 10 mg Mucous Membrane Troche, Take 1 Troche (10 mg total) by mouth Three times a day  diltiazem  HCl (TIAZAC ) 240 mg Oral Capsule,Sustained Action 24 hr, Take 1 Capsule (240 mg total) by mouth Daily  finasteride  (PROSCAR ) 5 mg Oral Tablet, Take 1 Tablet (5 mg total) by mouth Once a day  flecainide  (TAMBOCOR ) 100 mg Oral Tablet, Take 1 Tablet (100 mg total) by mouth Twice daily  furosemide  (LASIX ) 40 mg Oral Tablet, TAKE 1 TABLET ONE TIME DAILY  losartan  (COZAAR ) 50 mg Oral Tablet, Take 1 Tablet (50 mg total) by mouth Once a day  nitroGLYCERIN  (NITROSTAT ) 0.4 mg Sublingual Tablet, Sublingual, Place 1 Tablet (0.4 mg total) under the tongue Every 5 minutes as needed for Chest pain  nystatin  (MYCOSTATIN ) 100,000  unit/mL Oral Suspension, Take 5 mL by mouth Four times a day  potassium chloride  (KLOR-CON ) 10 mEq Oral Tablet Sustained Release, Take 1 Tablet (10 mEq total) by mouth Once a day  rosuvastatin  (CRESTOR ) 10 mg Oral Tablet, Take 1 Tablet (10 mg total) by mouth Every evening  SPIRIVA  WITH HANDIHALER 18 mcg Inhalation Capsule, w/Inhalation Device, Take 1 Capsule (18 mcg total) by inhalation Once a day    No facility-administered medications prior to visit.        Thanks,  Your Chronic Care Management Team

## 2023-10-14 ENCOUNTER — Ambulatory Visit (INDEPENDENT_AMBULATORY_CARE_PROVIDER_SITE_OTHER): Admitting: Family Medicine

## 2023-10-14 ENCOUNTER — Encounter (INDEPENDENT_AMBULATORY_CARE_PROVIDER_SITE_OTHER): Payer: Self-pay | Admitting: Family Medicine

## 2023-10-14 ENCOUNTER — Other Ambulatory Visit: Payer: Self-pay

## 2023-10-14 VITALS — BP 142/78 | HR 70 | Temp 97.8°F | Ht 70.0 in | Wt 251.0 lb

## 2023-10-14 DIAGNOSIS — I4892 Unspecified atrial flutter: Secondary | ICD-10-CM

## 2023-10-14 DIAGNOSIS — F17220 Nicotine dependence, chewing tobacco, uncomplicated: Secondary | ICD-10-CM

## 2023-10-14 DIAGNOSIS — J449 Chronic obstructive pulmonary disease, unspecified: Secondary | ICD-10-CM

## 2023-10-14 DIAGNOSIS — R600 Localized edema: Secondary | ICD-10-CM

## 2023-10-14 DIAGNOSIS — G250 Essential tremor: Secondary | ICD-10-CM

## 2023-10-14 DIAGNOSIS — Z1211 Encounter for screening for malignant neoplasm of colon: Secondary | ICD-10-CM

## 2023-10-14 DIAGNOSIS — N529 Male erectile dysfunction, unspecified: Secondary | ICD-10-CM

## 2023-10-14 DIAGNOSIS — I10 Essential (primary) hypertension: Secondary | ICD-10-CM

## 2023-10-14 DIAGNOSIS — Z7901 Long term (current) use of anticoagulants: Secondary | ICD-10-CM

## 2023-10-14 DIAGNOSIS — B370 Candidal stomatitis: Secondary | ICD-10-CM

## 2023-10-14 DIAGNOSIS — N400 Enlarged prostate without lower urinary tract symptoms: Secondary | ICD-10-CM

## 2023-10-14 MED ORDER — CARVEDILOL PHOSPHATE ER 10 MG CAPSULE,EXT.RELEASE24HR MULTIPHASE
1.0000 | ORAL_CAPSULE | Freq: Every day | ORAL | 3 refills | Status: DC
Start: 2023-10-14 — End: 2023-10-26

## 2023-10-14 MED ORDER — NYSTATIN 100,000 UNIT/ML ORAL SUSPENSION
5.0000 mL | Freq: Four times a day (QID) | ORAL | 1 refills | Status: DC
Start: 2023-10-14 — End: 2023-11-30

## 2023-10-14 NOTE — Nursing Note (Addendum)
 Pt presents to office for 4 mo follow up. Pt has a list of questions:  1 Tongue pain on side. ENT said it was some kind of fungus (thrush) is this a Spiriva  Side effect?   2 Liver function - Monitored why?   3 Viagra is generic equivalent?   4 12 cough drops a day due to cough and raspy voice  5 Left Ear itching last 3 months  6 Pharmacist reported meds are on contraindication list

## 2023-10-14 NOTE — Progress Notes (Signed)
 [facility] PRIMARY CARE  PRIMARY CARE, [facility]  [address] [place] [address]  [phone]     Thomas [name] is a 78 year old male who presents today for a six-month follow-up of hypertension and chronic medical problems.  The patient reports current adherence to recommended diet is good. The pt is compliant with medicine.  Regarding HTN:  He continues on a low dose of losartan  he is already on diltiazem  for rate control.  He denies the following symptoms: headache, blurred vision, chest discomfort, shortness of breath and palpitations.  Side effects of medication:dry cough  Regarding lipid treatment: He is taking Crestor  10 mg and is having no side effects.  Lab Results   Component Value Date    CHOLESTEROL 118 04/23/2023    HDLCHOL 59 04/23/2023    LDLCHOL 48 04/23/2023    TRIG 42 04/23/2023      He does have a history of atrial flutter and has follow-up with cardiology.  He was just seen there recently and no changes were made to his regimen.  They were slightly concerned about his blood pressure but they felt that this was secondary to dietary sodium intake and counseled him on that.  He continues on his Eliquis  as his anticoagulant, and a calcium channel blocker for rate control.  He also continues on flecainide .  He denies having any bleeding.  He denies any palpitations, lightheadedness, dizziness or chest pain.   he does have a history of chronic obstructive pulmonary disease and follows with pulmonology.  He continues on Spiriva .  He has not having any pulmonary symptoms at this time.  He does have history of lower extremity edema and continues to take Lasix  and potassium.  This does help control his swelling.  Kidney function has been excellent  He does follow with urology for history of BPH with lower urinary lower urinary symptoms.  He does continue to follow up with Urology and continues on both Uroxatral  and Proscar   He states he was treated by ENT for  thrush at 1 point and thought maybe it was a side effects to the Spiriva .  I explained to him that this is not an inhaled corticosteroid so she would not be causing thrush but he should still continue to rinse his mouth thoroughly after using it.  In the meantime he would like to have treatment with Mycostatin  which she used in the past and it helped  Most recent Cologuard 10/31/2020, negative    REVIEW OF SYSTEMS:  GENERAL: negative for fevers/chills, fatigue, significant weight change, sleep disturbance  HEENT:denies visual changes, sore throat or URI symptoms  RESPIRATORY:  Currently denying shortness of breath, cough or wheezing  CARDIAC:  Negative for chest pain or palpitations  GI: no nausea/vomitting/diarrhea, no abdominal pain  MUSCULOSKELETAL: no myalgias or arthragias, no recent trauma  NEUROLOGIC: no headache, visual changes or mental status changes  GU: no urinary symptoms or CVA tenderness      No Known Allergies    Current Outpatient Medications   Medication Sig    acetaminophen (TYLENOL) 500 mg Oral Tablet Take 1 Tablet (500 mg total) by mouth Every 4 hours as needed for Pain (pt takes tylenol pm)    alfuzosin  (UROXATRAL ) 10 mg Oral Tablet Sustained Release 24 hr Take 1 Tablet (10 mg total) by mouth Once a day    Carvedilol  Phosphate (COREG  CR) 10 mg Oral Cap, Multiphasic Release 24 hr Take 1 Capsule (10 mg total) by mouth Daily  diltiazem  HCl (TIAZAC ) 240 mg Oral Capsule,Sustained Action 24 hr Take 1 Capsule (240 mg total) by mouth Daily    ELIQUIS  5 mg Oral Tablet TAKE 1 TABLET TWICE DAILY    finasteride  (PROSCAR ) 5 mg Oral Tablet Take 1 Tablet (5 mg total) by mouth Once a day    flecainide  (TAMBOCOR ) 100 mg Oral Tablet Take 1 Tablet (100 mg total) by mouth Twice daily    furosemide  (LASIX ) 40 mg Oral Tablet TAKE 1 TABLET ONE TIME DAILY    nitroGLYCERIN  (NITROSTAT ) 0.4 mg Sublingual Tablet, Sublingual Place 1 Tablet (0.4 mg total) under the tongue Every 5 minutes as needed for Chest pain     nystatin  (MYCOSTATIN ) 100,000 unit/mL Oral Suspension Take 5 mL by mouth Four times a day As needed for thrush    potassium chloride  (KLOR-CON ) 10 mEq Oral Tablet Sustained Release Take 1 Tablet (10 mEq total) by mouth Once a day    rosuvastatin  (CRESTOR ) 10 mg Oral Tablet Take 1 Tablet (10 mg total) by mouth Every evening    SPIRIVA  WITH HANDIHALER 18 mcg Inhalation Capsule, w/Inhalation Device Take 1 Capsule (18 mcg total) by inhalation Once a day     Past Medical History:   Diagnosis Date    Arthritis     Atrial flutter     Cancer (CMS HCC)     Dysuria     Enteritis due to Rotavirus     Gout     Headache     Hemorrhoid     Hypercholesterolemia     Hypertension          Past Surgical History:   Procedure Laterality Date    HX HEMORRHOIDECTOMY           Social History     Tobacco Use    Smoking status: Former     Types: Cigars    Smokeless tobacco: Current     Types: Chew, Snuff   Substance Use Topics    Alcohol use: Yes     Alcohol/week: 2.0 standard drinks of alcohol     Types: 2 Cans of beer per week     Comment: few days a week        PHYSICAL EXAM:   The patient appears to be in no acute distress.  Vitals: BP (!) 142/78 (Site: Left Arm, Patient Position: Sitting, Cuff Size: Adult)   Pulse 70   Temp 36.6 C (97.8 F) (Temporal)   Ht 1.778 m (5\' 10" )   Wt 114 kg (251 lb)   SpO2 98%   BMI 36.01 kg/m      Respiratory: clear to ascultation B/L, no respiratory distress, equal BS throughout, no bibasilar rales  Heart: normal sinus rhythm today, no murmur, no edema, normal peripheral pulses  Abdomen: soft, nontender, positive bowel sounds  Extremities: no edema, no calf pain or tenderness  Neuro: AAOx3, cranial nerves's intact, DTR's 2/4 throughout  Skin: warm and dry, no rashes or lesions    ASSESSMENT:     ICD-10-CM    1. Essential hypertension - I10 ALT (SGPT)-blood pressure not well controlled.  I am going to add Coreg  XR for control better control of his blood pressure.     AST (SGOT)     BASIC METABOLIC  PANEL, FASTING     LIPID PANEL     CBC      2. Chronic obstructive pulmonary disease, unspecified COPD type (CMS HCC)  J44.9 Continue Spiriva  and follow up with pulmonology  3. Screening for colon cancer  Z12.11 Cologuard colon cancer screening      4. Paroxysmal atrial flutter (CMS HCC)  I48.92 Continue anti arrhythmic and rate controlling medications given by Cardiology.      5. Edema of both lower legs  R60.0 Continue Lasix  and potassium      6. Benign prostatic hyperplasia, unspecified whether lower urinary tract symptoms present  N40.0 Continue current regimen and follow up with Urology      7. Benign essential tremor  G25.0       8. Oral thrush  B37.0 Treatment with oral Mycostatin .  Rinse mouth well after using inhalers      9. Erectile dysfunction, unspecified erectile dysfunction type  N52.9 He can did discuss this further with his urologist         PLAN:   I reviewed appropriate screenings and HCM with patient.    Orders Placed This Encounter    ALT (SGPT)    AST (SGOT)    BASIC METABOLIC PANEL, FASTING    LIPID PANEL    CBC    nystatin  (MYCOSTATIN ) 100,000 unit/mL Oral Suspension    Carvedilol  Phosphate (COREG  CR) 10 mg Oral Cap, Multiphasic Release 24 hr    Cologuard colon cancer screening     Medication: continue current medication regimen unchanged  Return in about 4 months (around 02/14/2024) for blood work first.

## 2023-10-18 ENCOUNTER — Other Ambulatory Visit (INDEPENDENT_AMBULATORY_CARE_PROVIDER_SITE_OTHER): Payer: Self-pay | Admitting: Family Medicine

## 2023-10-18 NOTE — Progress Notes (Signed)
 Complex Care Management Care Guide Note  10/18/2023 Name: Tanner [name] MRN: [id number] DOB: 1945-08-29  Tanner [name] is a 78 y.o. year old male who is a primary care patient of Adelia Homestead, MD and is actively engaged with the care management team. I reached out to Baruch Bosch by phone today to assist with re-scheduling  with the RN Case Manager.  Follow up plan: pt declined to reschedule at this time   Kandis Ormond, CMA [facility]  [facility], [place] Behavioral Health Guide Direct Dial: [phone]  Fax: [phone] Website: [email or web address]

## 2023-10-18 NOTE — Telephone Encounter (Signed)
 Last Visit:10/14/2023     Upcoming appointments: 02/21/2024           Christain Courser, MA  10/18/2023, 10:53

## 2023-10-19 NOTE — Patient Outreach (Signed)
 Complex Care Management   Visit Note  10/19/2023  Name:  Tanner [name] MRN: [id number] DOB: Apr 18, 1946   RNCM received message from care guide. Patient declines rescheduling for follow up at this time.   Follow Up Plan:   Goals closed. Case closed.   Lindi Revering, RN, MSN, BSN, CCM [place]  [facility], Population Health Case Manager Phone: [phone]

## 2023-10-25 ENCOUNTER — Other Ambulatory Visit: Payer: Self-pay | Admitting: Internal Medicine

## 2023-10-26 ENCOUNTER — Ambulatory Visit (INDEPENDENT_AMBULATORY_CARE_PROVIDER_SITE_OTHER): Payer: Self-pay | Admitting: Family Medicine

## 2023-10-26 DIAGNOSIS — J9601 Acute respiratory failure with hypoxia: Secondary | ICD-10-CM | POA: Diagnosis not present

## 2023-10-26 MED ORDER — CARVEDILOL 6.25 MG TABLET
6.2500 mg | ORAL_TABLET | Freq: Two times a day (BID) | ORAL | 3 refills | Status: DC
Start: 2023-10-26 — End: 2023-11-15

## 2023-10-26 NOTE — Telephone Encounter (Signed)
 Okay, sent the b.i.d. version to the pharmacy instead

## 2023-10-26 NOTE — Telephone Encounter (Signed)
 Did not pend or change meds, unsure since this will not be  XR if dose of sig will change

## 2023-10-26 NOTE — Telephone Encounter (Signed)
 Regarding: Refill Request  ----- Message from [place] P sent at 10/26/2023 10:11 AM EDT -----  Copied From CRM #[id number].[facility] (Pharmacy) called to request a prescription refill.     They state that the script below is for an extended release, and that is not covered by the insurance, so this needs switched to a standard release.      Carvedilol  Phosphate (COREG  CR) 10 mg Oral Cap, Multiphasic Release 24 hr, Take 1 Capsule (10 mg total) by mouth Daily           Preferred Pharmacy     [facility] - [place], [place] - [address]    [address] [place] [address]    Phone: [phone] Fax: [phone]    Hours: Not open 24 hours    [facility] [place], [place] - [address]

## 2023-10-28 NOTE — Progress Notes (Signed)
 This encounter was created in error - please disregard.  CALLED X3 no answer/LDM to reschedule visit.

## 2023-11-03 ENCOUNTER — Other Ambulatory Visit (INDEPENDENT_AMBULATORY_CARE_PROVIDER_SITE_OTHER): Payer: Self-pay

## 2023-11-03 DIAGNOSIS — Z7189 Other specified counseling: Secondary | ICD-10-CM

## 2023-11-03 NOTE — Nursing Note (Signed)
 POPULATION HEALTH    COMPLEX CARE MANAGEMENT    Chronic Care Management - CCM  Status: Enrolled  Effective Dates: 12/02/2022 - present  Responsible Staff: Katina Parlor, RN    Behavioral Health Integration - BHI  Status: Enrolled  Effective Dates: 04/14/2023 - present  Responsible Staff: Luanna Rung, MSW          Patient reports to be doing well. He did just have a birthday . Patient celebrated with friends and daughter. Patient remains on healthy diet. Patient continues to check BP twice a day . Last office visit reported higher BP by office 142/78. Patient states usual numbers below 140's and diastolic 60-70's. Last two 141/65 and 128/63. Patient did have Coreg  dose changed due to insurance coverage. 6.25 mg bid. Patient started new dosage today. Patient noted his heart rate was 38 and that it normally is 40-45. Patient denied lightheaded, dizziness,CP. Patient states he did sleep a little over 8 hours. Patient feels good. DMC noted to document and contact [facility] if Heart rate remains in 30's medication dosages may need changed. Patient verbalized understanding and will continue to monitor. Patient remains active. His anxiety and depression are controled. No medication refills needed at this time. DMC will continue to monitor patient.     Care Management Tasks Completed:  Chart review completed.  Social determinants of health reviewed and updated as needed.  The following Care plan reviewed/discussed/updated.  Cardiac and wellness.  Interventions ongoing and updated as needed.  Goals addressed and updated.  Interventions ongoing and updated as needed.  The following Education completed. Heart rate control, medication changes, Diet, Hypertension monitoring.   Self management plan reviewed and updated.  See patient care coordination note.  Barriers to health, care plan, and goals reviewed.  Interventions ongoing and updated as needed.  The following Health maintenance/care gaps reviewed and discussed with  patient.  Patient has Cologuard, however [employer] may not cover , letter sent to cover awaiting on approval.  Health maintenance/care gaps updated as applicable.  Discussed upcoming appointment dates and times.  Reinforced importance of keeping all provider appointments.  Reinforced the importance of taking all medications as prescribed.  Medical history, surgical history, hospitalizations, and medications reviewed and updated as applicable.    Ongoing and Updated Assessment Interventions:  Dicussed general health   Confirmed start of Coreg  6.25 dosage   Discussed recent PCP appointment   Encouraged BP Checks   Reviewed higher BP at office 142/78  Encouraged healthy diet   Reviewed heart rate readings   Encouraged to contact if Heart rate remains below 40 .     Case Manager To Do List for the Next Interaction:  Monthly follow up  F/U Heart Rate   F/U BP   F/U Diet   F/U general health     Plan to call patient in ~ one month to reassess and update plan of care.  Instructed patient to call with change in symptoms or as needed prior to next follow up.      Katina Parlor, RN

## 2023-11-04 ENCOUNTER — Ambulatory Visit (HOSPITAL_COMMUNITY): Payer: Self-pay

## 2023-11-04 DIAGNOSIS — F338 Other recurrent depressive disorders: Secondary | ICD-10-CM

## 2023-11-04 NOTE — Nursing Note (Signed)
 Behavioral Health Integration--Call Attempt  Patient was enrolled in BHI on 04/14/2023  9:51 AM.   The patient is   in BHI.    BHI Care Team:   Primary Care Provider Karey Otter, DO  Behavioral Health Case Manager Luanna Rung, MSW  Collaborating Psychiatrist Helene Loader, MD     This MSW attempted to contact patient. Patient unreachable at this time.    Luanna Rung, MSW  11/04/2023, 13:22  Behavioral Health Case Manager

## 2023-11-05 ENCOUNTER — Other Ambulatory Visit (INDEPENDENT_AMBULATORY_CARE_PROVIDER_SITE_OTHER): Payer: Self-pay

## 2023-11-05 ENCOUNTER — Telehealth (INDEPENDENT_AMBULATORY_CARE_PROVIDER_SITE_OTHER): Payer: Self-pay | Admitting: Medical

## 2023-11-05 ENCOUNTER — Ambulatory Visit (HOSPITAL_COMMUNITY): Payer: Self-pay

## 2023-11-05 DIAGNOSIS — Z7189 Other specified counseling: Secondary | ICD-10-CM

## 2023-11-05 DIAGNOSIS — F338 Other recurrent depressive disorders: Secondary | ICD-10-CM

## 2023-11-05 NOTE — Nursing Note (Signed)
 POPULATION HEALTH    DISEASE MANAGEMENT COORDINATOR    Dmc reviewed response from [facility] in cardiology, [facility] called patient and reviewed verbal understanding of recommendation to stop coreg  at this time with low BP reading. Dmc will continue to follow.     Katina Parlor, RN

## 2023-11-05 NOTE — Nursing Note (Signed)
 POPULATION HEALTH    DISEASE MANAGEMENT COORDINATOR    Patient reports to [facility] that he took Heart rate an dit is reading 34 and 36. His BP this am was also 97/57. He does feel cold, no dizziness, lightheaded, or CP/ SOB. DMC encouraged to drink fluids and change positions from sitting to standing slowly. [facility] will message PCP and Cardiac to make aware of readings. DMC will respond to patient with reply.       Katina Parlor, RN, BSN  Disease Management Coordinator  [employer]  [phone]

## 2023-11-05 NOTE — Telephone Encounter (Signed)
 I have reached out to the patient enquiring on his low heart rates.  Patient reports recently being initiated on carvedilol  6.25 mg b.i.d. for blood pressure control by his primary care service due to experiencing cough with use of losartan .    He is currently on Tiazac  240 mg with regard to rate control given his atrial fibrillation.  Our recommendations at this time as not to add an additional AV nodal medication as this is precipitating his low heart rates in the 30s.      I have spoken with the patient and informed him I will reach out to his primary care provider with recommendations for medication adjustment.  He may fare better with the use of hydralazine  25 mg t.i.d. going forward for BP control.  Recommendations are to monitor patient once beta-blocker has been discontinued.  I will leave adjustments of antihypertensive therapies to PCP service.    A copy of this will be forwarded to his primary care provider with our current recommendations.    Sharyn Deforest, PA-C

## 2023-11-05 NOTE — Nursing Note (Signed)
 Behavioral Health Integration--Call Attempt  Patient was enrolled in BHI on 04/14/2023  9:51 AM.   The patient is   in BHI.    BHI Care Team:   Primary Care Provider Karey Otter, DO  Behavioral Health Case Manager Luanna Rung, MSW  Collaborating Psychiatrist Helene Loader, MD      Patient returned MSW call this morning and left message.    This MSW attempted to contact patient. Patient unreachable at this time.    Luanna Rung, MSW  11/05/2023, 12:46  Behavioral Health Case Manager

## 2023-11-09 ENCOUNTER — Ambulatory Visit: Payer: Self-pay

## 2023-11-09 ENCOUNTER — Ambulatory Visit (HOSPITAL_COMMUNITY): Payer: Self-pay

## 2023-11-09 DIAGNOSIS — F338 Other recurrent depressive disorders: Secondary | ICD-10-CM

## 2023-11-09 NOTE — Telephone Encounter (Signed)
 Copied from CRM [id number]. Topic: Clinical - Red Word Triage >> Nov 09, 2023  9:18 AM Howard Macho wrote: Red Word that prompted transfer to Nurse Triage: patient is having pain inside his jar couple of weeks    Chief Complaint: Left Side of  inside of mouth pain Symptoms: pain Frequency: Over a month but worse in the past two weeks Pertinent Negatives: Patient denies chest pain, difficulty breathing, swelling, fever.  Disposition: [] ED /[] Urgent Care (no appt availability in office) / [] Appointment(In office/virtual)/ []  [facility]/ [] Home Care/ [x] Refused Recommended Disposition /[] [place] [facility]/ [x]  Follow-up with PCP Additional Notes: Patient called and advised that the left side of his jaw inside his mouth has been hurting. Patient states that he was given a steroid (Prednisone ) and viscous lidocaine  about a month ago. He denies any chest pain, difficulty breathing, swelling, fever. Patient states that this is off and on and he has been using lidocaine  but doesn't want to keep using lidocaine  all the time. He states it is a little better but not completely. Patient states biting down too hard or if anything cold gets in this area it exacerbates the pain. Patient states that he would like to know about a referral to Ears/Nose/Throat doctor about this. Patient states that they might've discussed this the last time he was in person. Patient states that wearing his dentures make the pain worse. He states that he has been dealing with this a long time. He also had an issue with it about a year ago when he was in office for his PCP to see him. He first wanted to just see about if his PCP wanted to refer him to an Ears/Nose/Throat specialist or if she wanted to see him again in the office. Patient is also made aware to call us  back with any changes and if things get worse to go to the Emergency Room or call 911 if needed. Patient verbalized understanding.    Reason for  Disposition  [1] MODERATE pain (e.g., interferes with normal activities) AND [2] constant AND [3] present > 24 hours  Answer Assessment - Initial Assessment Questions 1. ONSET: "When did the pain start?" (e.g., minutes, hours, days)     Over a month but worse in the past two weeks 2. ONSET: "Does the pain come and go, or has it been constant since it started?" (e.g., constant, intermittent, fleeting)     Comes and goes 3. SEVERITY: "How bad is the pain?"   (Scale 1-10; mild, moderate or severe)   - MILD (1-3): doesn't interfere with normal activities    - MODERATE (4-7): interferes with normal activities or awakens from sleep    - SEVERE (8-10): excruciating pain, unable to do any normal activities      "Comes and goes and I have been using lidocaine  as well 4. LOCATION: "Where does it hurt?"      Left side of jaw 5. RASH: "Is there any redness, rash, or swelling of the face?"     no 6. FEVER: "Do you have a fever?" If Yes, ask: "What is it, how was it measured, and when did it start?"      No 7. OTHER SYMPTOMS: "Do you have any other symptoms?" (e.g., fever, toothache, nasal discharge, nasal congestion, clicking sensation in jaw joint)     Runny nose  Protocols used: Face Pain-A-AH

## 2023-11-09 NOTE — Nursing Note (Signed)
 Behavioral Health Integration--Brief Note  BHI Enrollment Date Patient was enrolled in BHI on 04/14/2023  9:51 AM.     PCP Karey Otter, DO   Collaborating Psychiatrist: Helene Loader, MD  Behavioral Health Case Manager: Luanna Rung, MSW       Patient returned MSW call on 5/23. MSW attempted to reach patient via telephone this date. No answer. MSW left message with plan to contact him again tomorrow morning.   Total BHI time spent this encounter is   minutes.    Luanna Rung, MSW  11/09/2023, 12:35  Behavioral Health Case Manager

## 2023-11-10 ENCOUNTER — Ambulatory Visit (HOSPITAL_COMMUNITY): Payer: Self-pay

## 2023-11-10 ENCOUNTER — Ambulatory Visit (INDEPENDENT_AMBULATORY_CARE_PROVIDER_SITE_OTHER): Payer: Self-pay | Admitting: Family Medicine

## 2023-11-10 ENCOUNTER — Other Ambulatory Visit (INDEPENDENT_AMBULATORY_CARE_PROVIDER_SITE_OTHER): Payer: Self-pay

## 2023-11-10 DIAGNOSIS — Z7189 Other specified counseling: Secondary | ICD-10-CM

## 2023-11-10 DIAGNOSIS — F338 Other recurrent depressive disorders: Secondary | ICD-10-CM

## 2023-11-10 NOTE — Telephone Encounter (Signed)
 Can have office visit. Lidocaine  is very safe so if effective that would be best to continue.

## 2023-11-10 NOTE — Nursing Note (Signed)
 Behavioral Health Integration--Brief Note  BHI Enrollment Date Patient was enrolled in BHI on 04/14/2023  9:51 AM.     PCP Karey Otter, DO   Collaborating Psychiatrist: Helene Loader, MD  Behavioral Health Case Manager: Luanna Rung, MSW       Patient graduated from [facility].   Total BHI time spent this encounter is   minutes.    Luanna Rung, MSW  11/10/2023, 09:42  Behavioral Health Case Manager

## 2023-11-10 NOTE — Nursing Note (Signed)
 POPULATION HEALTH  Behavioral Health Integration - Psychiatric Collaborative Care - Follow Up    BHI Enrollment Date Patient was enrolled in BHI on 04/14/2023  9:51 AM.    PCP Karey Otter, DO   Behavioral Health Case Manager Luanna Rung, MSW  Collaborating Psychiatrist Helene Loader, MD     ASSESSMENT/ PLAN:    MSW spoke with patient via telephone this date. Relapse prevention plan and PHQ9 completed. Graduation discussed, and patient in agreement.   Patient graduating from [facility].      Brief Intervention(s):   Emotional Support  Follow with regular PHQ-9     Patient Goals:    Goals Addressed                   This Visit's Progress     COMPLETED: Anxiety Symptoms Monitored and Managed        Evidence-based guidance:   Explore treatment options in a youth-friendly atmosphere of hope and optimism [e.g., cognitive behavioral therapy, pharmacologic therapy, adjunct therapy (applied relaxation, mindfulness, meditation)].   Encourage ?obooster? sessions of cognitive behavioral therapy to prevent relapse that may occur 6 to 24 months after treatment has concluded.   Prepare child and caregiver for pharmacologic therapy (e.g., benzodiazepine) as short-term treatment to alleviate severe symptoms while allotting time for anxiolytic medication to become therapeutic, as well as allow for engagement    in treatment.   Prepare patient for short-term pharmacologic therapy when symptoms become debilitating (e.g., selective serotonin-reuptake inhibitor, serotonin-norepinephrine-reuptake inhibitor, tricyclic antidepressant).   Anticipate referral to behavioral health specialist or psychiatry when first-line treatments are not effective, anxiety is complicated by alcohol or substance use, symptoms interfere with social functioning, secondary depression or    suicidality occurs.   Consider parent-based training when child participation in cognitive behavioral therapy is not possible or child is not responding to treatment.    Address barriers to treatment that may include long wait times, delays in scheduling appointments, lack of pediatric mental health providers, transportation, inadequate or no insurance or stigma associated with mental health    disorders and treatment.    Notes:                 SUBJECTIVE:    Current Behavioral Health History:  Behavioral Health Care Management - Encounter Level:   Recent Suicidal Thoughts?: NONE  Recent Homicidal Thoughts?: denies homicidal ideation  Psychiatric Medication Assessment:  Medication Adverse Effects: No  Brief Interventions Provided this Encounter: Emotional Support  Safety risk related to mental health?: Neg          OBJECTIVE:  Validated Measures:       08/18/2023    11:00 AM 10/06/2023     9:43 AM 11/10/2023     9:00 AM   Most Recent PHQ-9 Scores   PHQ 9 Total 0 0 0       Little interest or pleasure in doing things.: 0  Feeling down, depressed, or hopeless: 0  PHQ 2 Total: 0  Trouble falling or staying asleep, or sleeping too much.: 0  Feeling tired or having little energy: 0  Poor appetite or overeating: 0  Feeling bad about yourself/ that you are a failure in the past 2 weeks?: 0  Trouble concentrating on things in the past 2 weeks?: 0  Moving/Speaking slowly or being fidgety or restless  in the past 2 weeks?: 0  Thoughts that you would be better off DEAD, or of hurting yourself in some way.: 0  If you checked  off any problems, how difficult have these problems made it for you to do your work, take care of things at home, or get along with other people?: Not difficult at all  PHQ 9 Total: 0  Interpretation of Total Score: No depression          Ongoing and Updated Assessment Interventions:  N/a      Case Manager To Do List for the Next Interaction:    [facility] closing           Luanna Rung, MSW  11/10/2023, 09:33  Behavioral Health Case Manager

## 2023-11-10 NOTE — Nursing Note (Signed)
 POPULATION HEALTH    DISEASE MANAGEMENT COORDINATOR    Patient reports after stopping Coreg  completely BP now running high at 180/81 178/84 and 174/86. He noted that he did take one coreg  when reading was high and Bp resolved back to normal. Patient wants provider input. DMC will Contact Cardio and PCP. Patients PCP replied with telling patient  that he can take half of the 6.25 mg in am and half in evening. Patient verbalized understanding and will continue to monitor. BP .       Katina Parlor, RN, BSN  Disease Management Coordinator  [employer]  [phone]

## 2023-11-10 NOTE — Progress Notes (Signed)
 Behavioral Health Integration --Last encounter this month  Behavioral Health Integration - BHI  Status: Enrolled  Effective Dates: 04/14/2023 - present  Responsible Staff: Luanna Rung, MSW      BHI Care Team:   Primary Care Provider Karey Otter, DO  Behavioral Health Case Manager Luanna Rung, MSW  Collaborating Psychiatrist Helene Loader, MD    Behavioral Health Integration Psychiatric Collaborative Care services have been performed by behavioral health case manager during this calendar month under the supervision of primary care provider Karey Otter, DO for behavioral health condition(s):  (F33.8) Seasonal depression (CMS [facility])  (primary encounter diagnosis)      The patient hasbeen added to a patient roster for weekly case load review with collaborating psychiatrist.  [name] treatment plan and status has been reviewed in weekly case conference with collaborating psychiatrist.    Brief interventions provided by case manager during this calendar month: emotional support    Validated Measures used to monitor patient progress during this calendar month:      08/18/2023    11:00 AM 10/06/2023     9:43 AM 11/10/2023     9:00 AM   Most Recent PHQ-9 Scores   PHQ 9 Total 0 0 0           04/14/2023    10:00 AM   Most Recent GAD-7 Scores   Gad-7 Score Total 4           Total BHI time spent this month is 9 minutes.     Luanna Rung, MSW  11/10/2023, 09:40

## 2023-11-11 ENCOUNTER — Other Ambulatory Visit (INDEPENDENT_AMBULATORY_CARE_PROVIDER_SITE_OTHER): Payer: Self-pay

## 2023-11-11 DIAGNOSIS — Z7189 Other specified counseling: Secondary | ICD-10-CM

## 2023-11-11 NOTE — Nursing Note (Signed)
 POPULATION HEALTH    DISEASE MANAGEMENT COORDINATOR    Dmc received another message pre cardio regarding BP Heart rate and Coreg  medication and dosage. DMC did forward message to PCP , being that PCP instructed patient to take 1/2 of 6.25 Coreg  BID. Due to conflict PCP request patient to call in and schedule na appointment to address BP and heart rate. DMC called and relayed message to patient. Patient verbalized understanding and will call office today to schedule an appointment.       Katina Parlor, RN, BSN  Disease Management Coordinator  Population Health  [phone]      Katina Parlor, RN

## 2023-11-11 NOTE — Nursing Note (Signed)
 POPULATION HEALTH    DISEASE MANAGEMENT COORDINATOR    Patient called and left message for [facility] that he did call PCP office for appointment unable to get in until June 10th . He is nervous about the Coreg  and stated maybe PCP can give him something instead until appoint or go back on losartan  . [facility] will message provider  and update patient.     Katina Parlor, RN

## 2023-11-12 ENCOUNTER — Other Ambulatory Visit: Payer: Self-pay | Admitting: Internal Medicine

## 2023-11-12 ENCOUNTER — Telehealth (INDEPENDENT_AMBULATORY_CARE_PROVIDER_SITE_OTHER): Payer: Self-pay | Admitting: Family Medicine

## 2023-11-12 ENCOUNTER — Other Ambulatory Visit (INDEPENDENT_AMBULATORY_CARE_PROVIDER_SITE_OTHER): Payer: Self-pay

## 2023-11-12 DIAGNOSIS — I10 Essential (primary) hypertension: Secondary | ICD-10-CM

## 2023-11-12 DIAGNOSIS — E782 Mixed hyperlipidemia: Secondary | ICD-10-CM

## 2023-11-12 DIAGNOSIS — Z7189 Other specified counseling: Secondary | ICD-10-CM

## 2023-11-12 NOTE — Telephone Encounter (Signed)
 Unsure if below was addressed, this was in staff messages, i did copy and paste to this

## 2023-11-12 NOTE — Telephone Encounter (Signed)
 W: High BP  Received: Shaune Delaine, Sarah, DO  P [facility]  Is his heart rate not any better now that we lower the dose of his carvedilol ?  What as it running now?  I do not know why he stopped the losartan  in the 1st place is there reason why?  Did he have side effects to it or something          Previous Messages       ----- Message -----  From: Katina Parlor, RN  Sent: 11/11/2023   3:49 PM EDT  To: Karey Otter, DO  Subject: FW: High BP                                      Sorry This has been an ongoing thread. Patient is scheduled for June 10th . He is worried about taking Coreg  and the lower heart rate BP issue, he was asking if another med can be given until the appointment or to go back on losartan  .... I told him I would pass along message but for him to continue to check BP and heart rate    Thank you  ----- Message -----  From: Katina Parlor, RN  Sent: 11/11/2023  10:08 AM EDT  To: Christain Courser, MA  Subject: RE: High BP                                      Perfect I spoke with Raenette Bumps he is calling office to schedule an appointment :)  ----- Message -----  From: Christain Courser, MA  Sent: 11/11/2023   9:50 AM EDT  To: Katina Parlor, RN  Subject: FW: High BP                                        ----- Message -----  From: Karey Otter, DO  Sent: 11/10/2023   5:06 PM EDT  To: [facility]  Subject: FW: High BP                                      0k, that is true the reason that we decrease his carvedilol  was because of the heart rate.  So make sure he stays on that lower dose of carvedilol  then.  We can always put him on something different for his blood pressure.  Please ask if he is willing to come in for an office visit so we can assess this.  ----- Message -----  From: Katina Parlor, RN  Sent: 11/10/2023   5:02 PM EDT  To: Karey Otter, DO  Subject: FW: High BP                                      Hello Cardo replied to message with below message I am forwarding  .  ----- Message -----  From: Sharyn Deforest, PA-C  Sent: 11/10/2023   3:14 PM EDT  To: Katina Parlor, RN  Subject: RE: High BP  My only concern is that of bradycardia with the use of both moderate dose calcium channel blocker and beta-blocker.  I would also want to monitor his QT interval.  If you have the capability of performing EKGs I would advice to reassess with a EKGs if he is going to be resuming carvedilol  at a low dose.      Thank you.  Ludwig Safer  ----- Message -----  From: Katina Parlor, RN  Sent: 11/10/2023   2:43 PM EDT  To: Sharyn Deforest, PA-C  Subject: RE: High BP                                      Hello yes he was aware to stop medication but took one pill on his own when his BP started to raise. Dr. Tresia Fruit did reply and instructed him to take half of the 6.25mg  in am and half in evening to see how he did with that . I did contact patient to let him know.  ----- Message -----  From: Sharyn Deforest, Kirby Peoples  Sent: 11/10/2023   2:33 PM EDT  To: Katina Parlor, RN  Subject: RE: High BP                                      We took him off of the carvedilol  as his heart rate was down into the 30s.  He probably would be better with hydralazine  25 mg b.i.d. or t.i.d. dosing.  Thank you.  ----- Message -----  From: Katina Parlor, RN  Sent: 11/10/2023   1:16 PM EDT  To: Karey Otter, DO; Sharyn Deforest, PA-C  Subject: High BP                                          Hello,      So sorry for the message, Raenette Bumps contacted me and stated his BP has been running higher the past few days after discontinuing the Coreg  6.5mg  BID. Readings : 180/81 , 178/84 and 174/86 .  Patient did state he took one 6.25mg  Coreg  and BP resolved to normal level. He wanted me to reach out for guidance  on medication.      Thank you,  Farran

## 2023-11-12 NOTE — Progress Notes (Signed)
 Dr. Karey Otter, DO  ,    This patient has met the requirements to bill for COMPLEX Chronic Care Management this month.     Please add .[id number] to this note for attestation and billing.      Chronic Care Management Time Documentation on 11/12/2023 16:03.   Time spent during current encounter is 1 minutes.   Cumulative time during current month's episode (month-to-date) is 90 minutes.          ICD-10-CM    1. Encounter for counseling for care management of patient with chronic conditions and complex health needs using nurse-based model  Z71.89       2. Essential hypertension  I10       3. Mixed hyperlipidemia  E78.2             acetaminophen (TYLENOL) 500 mg Oral Tablet, Take 1 Tablet (500 mg total) by mouth Every 4 hours as needed for Pain (pt takes tylenol pm)  alfuzosin  (UROXATRAL ) 10 mg Oral Tablet Sustained Release 24 hr, Take 1 Tablet (10 mg total) by mouth Once a day  carvediloL  (COREG ) 6.25 mg Oral Tablet, Take 1 Tablet (6.25 mg total) by mouth Twice daily with food  diltiazem  HCl (TIAZAC ) 240 mg Oral Capsule,Sustained Action 24 hr, Take 1 Capsule (240 mg total) by mouth Daily  ELIQUIS  5 mg Oral Tablet, TAKE 1 TABLET TWICE DAILY  finasteride  (PROSCAR ) 5 mg Oral Tablet, Take 1 Tablet (5 mg total) by mouth Once a day  flecainide  (TAMBOCOR ) 100 mg Oral Tablet, Take 1 Tablet (100 mg total) by mouth Twice daily  furosemide  (LASIX ) 40 mg Oral Tablet, TAKE 1 TABLET ONE TIME DAILY  nitroGLYCERIN  (NITROSTAT ) 0.4 mg Sublingual Tablet, Sublingual, Place 1 Tablet (0.4 mg total) under the tongue Every 5 minutes as needed for Chest pain  nystatin  (MYCOSTATIN ) 100,000 unit/mL Oral Suspension, Take 5 mL by mouth Four times a day As needed for thrush  potassium chloride  (KLOR-CON ) 10 mEq Oral Tablet Sustained Release, Take 1 Tablet (10 mEq total) by mouth Once a day  rosuvastatin  (CRESTOR ) 10 mg Oral Tablet, Take 1 Tablet (10 mg total) by mouth Every evening  SPIRIVA  WITH HANDIHALER 18 mcg Inhalation Capsule,  w/Inhalation Device, Take 1 Capsule (18 mcg total) by inhalation Once a day    No facility-administered medications prior to visit.      Thanks,  Your Chronic Care Management Team

## 2023-11-15 MED ORDER — LOSARTAN 50 MG TABLET
50.0000 mg | ORAL_TABLET | Freq: Every day | ORAL | Status: DC
Start: 2023-11-15 — End: 2023-11-23

## 2023-11-15 NOTE — Telephone Encounter (Signed)
 S/W pt he went back to Losartan . His heart rate is back at normal range. C/O w/ carvedilol  -Chest Pain Heart palpatations and swelling of legs. Pt had stopped Losartan  due to excessive coughing and hoarseness.

## 2023-11-15 NOTE — Telephone Encounter (Signed)
Ok, med list updated

## 2023-11-23 ENCOUNTER — Other Ambulatory Visit: Payer: Self-pay

## 2023-11-23 ENCOUNTER — Encounter (INDEPENDENT_AMBULATORY_CARE_PROVIDER_SITE_OTHER): Payer: Self-pay | Admitting: Family Medicine

## 2023-11-23 ENCOUNTER — Ambulatory Visit (INDEPENDENT_AMBULATORY_CARE_PROVIDER_SITE_OTHER): Payer: Self-pay | Admitting: Family Medicine

## 2023-11-23 VITALS — BP 130/72 | HR 48 | Temp 97.3°F | Ht 70.0 in | Wt 255.2 lb

## 2023-11-23 DIAGNOSIS — R001 Bradycardia, unspecified: Secondary | ICD-10-CM

## 2023-11-23 DIAGNOSIS — I4891 Unspecified atrial fibrillation: Secondary | ICD-10-CM

## 2023-11-23 DIAGNOSIS — I10 Essential (primary) hypertension: Secondary | ICD-10-CM

## 2023-11-23 MED ORDER — HYDRALAZINE 10 MG TABLET
10.0000 mg | ORAL_TABLET | Freq: Three times a day (TID) | ORAL | 0 refills | Status: DC
Start: 2023-11-23 — End: 2023-11-26

## 2023-11-23 NOTE — Progress Notes (Signed)
 [facility] PRIMARY CARE  PRIMARY CARE, [facility]  [address] [place] [address]  [phone]     Thomas [name] is a 78 y.o. male who presents today for follow-up of hypertension   The patient reports current adherence to recommended diet is good. The pt is compliant with medicine.  He was seen here about one-month ago and at that time his blood pressure was not well controlled.  He was complaining of also having side effects to the losartan  of a nagging cough.  He had side effects to lisinopril  also producing a cough in the past.  At that time we did add a long-acting carvedilol  to help with his blood pressure as his heart rate was 70.  Unfortunately the insurance would not cover the long-acting carvedilol  so we gave him a small dose of short-acting Coreg .  The patient states that he did not tolerate the medication.  He had several side effects including chest pain, shortness a breath, palpitations, weight gain and low heart rate.  The patient has been calling in and talking to the [facility] integration nurse, Elinor Guardian, we were concerned about his low heart rate.  She was sending me staff messages about what to do with the patient's medication.  Initially we decrease his dose of carvedilol  but he still was bradycardic, in his blood pressure started to go back up.  He states he was willing to go back on the losartan  even though it caused a cough because he knew it helped with his blood pressure so we decided he would go back on the losartan  and stopped the carvedilol .  Now his blood pressure is a lot better and his heart rate is back up in the 40s and 50s where he states it usually is and he feels well at that number.  He also states he does not feel well if his blood pressure goes too low he said that that puts him into atrial fibrillation if it goes less than 130.  He also tells me today that he does not want to stay on the losartan  because he said the cough is to  annoying and nagging.  He wants carvedilol  to be added to his allergy list because he feels that he had every side effects to that medication as well.  REVIEW OF SYSTEMS:  GENERAL: negative for fatigue, significant weight change, sleep disturbance  HEENT:denies visual changes, sore throat, or URI symptoms  RESPIRATORY: No shortness of breath, cough, wheeze  CARDIAC: No chest pains; no palpitations, exertional dyspnea  GI: no N/V/D, no abdominal pain or change in bowel habits  MUSCULOSKELETAL: no myalgias or arthragias, no recent trauma   NEUROLOGIC: no headache, visual changes or mental status changes  GU: no urinary symptoms or CVA tenderness      Allergies[1]  Current Outpatient Medications   Medication Sig    acetaminophen (TYLENOL) 500 mg Oral Tablet Take 1 Tablet (500 mg total) by mouth Every 4 hours as needed for Pain (pt takes tylenol pm)    alfuzosin  (UROXATRAL ) 10 mg Oral Tablet Sustained Release 24 hr Take 1 Tablet (10 mg total) by mouth Once a day    diltiazem  HCl (TIAZAC ) 240 mg Oral Capsule,Sustained Action 24 hr Take 1 Capsule (240 mg total) by mouth Daily    ELIQUIS  5 mg Oral Tablet TAKE 1 TABLET TWICE DAILY    finasteride  (PROSCAR ) 5 mg Oral Tablet Take 1 Tablet (5 mg total) by mouth Once a day  flecainide  (TAMBOCOR ) 100 mg Oral Tablet Take 1 Tablet (100 mg total) by mouth Twice daily    furosemide  (LASIX ) 40 mg Oral Tablet TAKE 1 TABLET ONE TIME DAILY    hydrALAZINE  (APRESOLINE ) 10 mg Oral Tablet Take 1 Tablet (10 mg total) by mouth Three times a day    nitroGLYCERIN  (NITROSTAT ) 0.4 mg Sublingual Tablet, Sublingual Place 1 Tablet (0.4 mg total) under the tongue Every 5 minutes as needed for Chest pain    nystatin  (MYCOSTATIN ) 100,000 unit/mL Oral Suspension Take 5 mL by mouth Four times a day As needed for thrush    potassium chloride  (KLOR-CON ) 10 mEq Oral Tablet Sustained Release Take 1 Tablet (10 mEq total) by mouth Once a day    rosuvastatin  (CRESTOR ) 10 mg Oral Tablet Take 1 Tablet (10 mg  total) by mouth Every evening    SPIRIVA  WITH HANDIHALER 18 mcg Inhalation Capsule, w/Inhalation Device Take 1 Capsule (18 mcg total) by inhalation Once a day     Past Medical History:   Diagnosis Date    Arthritis     Atrial flutter     Cancer (CMS HCC)     Dysuria     Enteritis due to Rotavirus     Gout     Headache     Hemorrhoid     Hypercholesterolemia     Hypertension          Family Medical History:       Problem Relation (Age of Onset)    Cancer Mother    Hypertension (High Blood Pressure) Mother, Father            Social History     Tobacco Use    Smoking status: Former     Types: Cigars    Smokeless tobacco: Current     Types: Chew, Snuff   Substance Use Topics    Alcohol use: Yes     Alcohol/week: 2.0 standard drinks of alcohol     Types: 2 Cans of beer per week     Comment: few days a week        PHYSICAL EXAM:   The patient appears to be in no acute distress.  Vitals:  Vitals:    11/23/23 0831   BP: 130/72   Pulse: (!) 48   Temp: 36.3 C (97.3 F)   SpO2: 97%   Weight: 116 kg (255 lb 3.2 oz)   Height: 1.778 m (5' 10)   BMI: 36.62      HEENT: Normocephalic, atraumatic, PERRLA, EOMI, oropharyx benign, TM's benign  Respiratory: CTA, no resp distress, equal BS  Heart: RRR, no murmur, no edema, normal pulses  GI: soft, NT, positive bowel sounds  Extremities: no edema, no calf pain or tenderness  Neuro: AAOx3, CN's intact, DTR's 2/4 throughout    ASSESSMENT:     ICD-10-CM    1. Primary hypertension  I10       2. Bradycardia  R00.1       3. Atrial fibrillation with controlled ventricular rate (CMS HCC)  I48.91           We are stopping the losartan  because of a cough.  I did add lisinopril , losartan  and carvedilol  all to his allergy list because he had side effects to all of them.  We are not going to put any other rate-controlling medications on board because his heart rate is already in the 40s.  He states that he feels well when it is in the 40s but  I suggest that once we get his blood pressure under  control we even decrease his calcium channel blocker even more so that his heart rate gets out of the 40s.  Will add hydralazine  at a low dose of 10 mg b.i.d. and see him back in 2 weeks.  If his blood pressure is controlled we will leave it they are if it is still not controlled we will increase the dose of hydralazine .  Again also considering he has never had rate control issues with his atrial fibrillation most likely we are going to decrease his dose of calcium channel blocker.  I did discuss this at length with the patient today and he voices understanding           Follow Up:  Return 2-3 weeks.  Orders Placed This Encounter    hydrALAZINE  (APRESOLINE ) 10 mg Oral Tablet        Karey Otter, DO         [1]   Allergies  Allergen Reactions    Lisinopril   Other Adverse Reaction (Add comment)     cough    Losartan   Other Adverse Reaction (Add comment)     cough    Carvedilol   Other Adverse Reaction (Add comment)     decreased heart rate

## 2023-11-23 NOTE — Nursing Note (Signed)
 Pt is present for a follow up. Pt reports he is allergic to beta blockers, this caused a decrease in heart rate, chest pain, swelling of the leg.

## 2023-11-24 ENCOUNTER — Other Ambulatory Visit (INDEPENDENT_AMBULATORY_CARE_PROVIDER_SITE_OTHER): Payer: Self-pay | Admitting: Family

## 2023-11-24 NOTE — Telephone Encounter (Signed)
 REVIEWED ACTIVE MEDICATIONS. PARAMETERS FOR REFILL MET PER DEPARTMENTAL REFILL PROTOCOL. REFILL SENT ELECTRONICALLY TO PHARMACY. ORDER FORWARDED TO PROVIDER TO CO-SIGN.  Miquel Dunn, RN

## 2023-11-26 ENCOUNTER — Other Ambulatory Visit (INDEPENDENT_AMBULATORY_CARE_PROVIDER_SITE_OTHER): Payer: Self-pay | Admitting: Family Medicine

## 2023-11-26 DIAGNOSIS — J9601 Acute respiratory failure with hypoxia: Secondary | ICD-10-CM | POA: Diagnosis not present

## 2023-11-26 MED ORDER — HYDRALAZINE 10 MG TABLET
10.0000 mg | ORAL_TABLET | Freq: Three times a day (TID) | ORAL | 1 refills | Status: DC
Start: 2023-11-26 — End: 2023-12-21

## 2023-11-26 NOTE — Telephone Encounter (Signed)
 Regarding: Dr Karey Otter, 90 day supply refill  ----- Message from Barton Like sent at 11/26/2023  2:20 PM EDT -----  Copied From CRM #[id number] (Self) called to request a prescription refill- requesting a 90 day supply sent:      Disp Refills Start End   hydrALAZINE  (APRESOLINE ) 10 mg Oral Tablet 30 Tablet 0 11/23/2023 -   Sig - Route: Take 1 Tablet (10 mg total) by mouth Three times a day - Oral   Sent to pharmacy as: hydrALAZINE  10 mg tablet (APRESOLINE )   Class: E-Rx   Non-formulary Exception Code: [email or web address]/No Formulary Info Available   E-Prescribing Status: Receipt confirmed by pharmacy (11/23/2023  9:26 AM EDT)     Preferred Pharmacy     [facility] - [place], [place] - [address]    [address] [place] [address]    Phone: [phone] Fax: [phone]    Hours: Not open 24 hours     Thank you,  Maryhelen Snide

## 2023-11-26 NOTE — Telephone Encounter (Signed)
 Last Visit:11/23/2023     Upcoming appointments: 12/21/2023           [facility] Azarria Balint, MA  11/26/2023, 14:45

## 2023-11-29 ENCOUNTER — Other Ambulatory Visit (INDEPENDENT_AMBULATORY_CARE_PROVIDER_SITE_OTHER): Payer: Self-pay | Admitting: Family Medicine

## 2023-11-30 NOTE — Telephone Encounter (Signed)
 Last Visit:11/23/2023     Upcoming appointments: 12/21/2023           Christain Courser, MA  11/30/2023, 09:04

## 2023-12-02 NOTE — Progress Notes (Unsigned)
 Subjective:    Patient ID: Tanner [name], male    DOB: Oct 20, 1945, 78 y.o.   MRN: [id number]      HPI [name] is here for No chief complaint on file.        Medications and allergies reviewed with patient and updated if appropriate.  Current Outpatient Medications on File Prior to Visit  Medication Sig Dispense Refill   acetaminophen  (TYLENOL ) 500 MG tablet Take 1,000 mg by mouth every 8 (eight) hours as needed for moderate pain (pain score 4-6).     amiodarone  (PACERONE ) 200 MG tablet TAKE 1 TABLET BY MOUTH DAILY 270 tablet 0   apixaban  (ELIQUIS ) 5 MG TABS tablet Take 5 mg by mouth 2 (two) times daily.     atorvastatin  (LIPITOR ) 80 MG tablet Take 80 mg by mouth daily.     buPROPion  (WELLBUTRIN  SR) 150 MG 12 hr tablet Take 150 mg by mouth 2 (two) times daily.     carvedilol  (COREG ) 6.25 MG tablet TAKE 1 TABLET BY MOUTH 2 TIMES DAILY WITH A MEAL. 180 tablet 1   cetirizine  (ZYRTEC ) 10 MG tablet Take 1 tablet (10 mg total) by mouth daily. 30 tablet 0   diphenhydrAMINE  (BENADRYL ) 25 MG tablet Take 25 mg by mouth at bedtime.     doxycycline  (VIBRAMYCIN ) 100 MG capsule Take 1 capsule (100 mg total) by mouth 2 (two) times daily. Start doxycycline  AFTER completing the course of linezolid  (Zyvox ) and continue indefinitely     empagliflozin  (JARDIANCE ) 10 MG TABS tablet Take 25 mg by mouth daily. 1/2 tablet daily     finasteride  (PROSCAR ) 5 MG tablet Take 5 mg by mouth at bedtime.     gabapentin  (NEURONTIN ) 300 MG capsule Take 1 capsule (300 mg total) by mouth 3 (three) times daily.     guaiFENesin  (ROBITUSSIN) 100 MG/5ML liquid Take 5 mLs by mouth every 4 (four) hours as needed for cough or to loosen phlegm.     lidocaine  (XYLOCAINE ) 2 % solution USE AS DIRECTED 15 MLS IN THE MOUTH OR THROAT AS NEEDED FOR MOUTH PAIN. 100 mL 3   lidocaine  (XYLOCAINE ) 4 % external solution Apply topically as needed.     Multiple Vitamins-Minerals (MULTIVITAMINS THER. W/MINERALS) TABS Take 1 tablet by  mouth daily.     nortriptyline  (PAMELOR ) 10 MG capsule Take 20 mg by mouth at bedtime.     pantoprazole  (PROTONIX ) 40 MG tablet Take 1 tablet (40 mg total) by mouth daily at 12 noon.     polyethylene glycol (MIRALAX  / GLYCOLAX ) 17 g packet Take 17 g by mouth daily.     potassium chloride  (KLOR-CON ) 10 MEQ tablet Take 10 mEq by mouth daily.     predniSONE  (DELTASONE ) 1 MG tablet Take 1 mg by mouth daily with breakfast.     predniSONE  (DELTASONE ) 20 MG tablet Take 2 tablets (40 mg total) by mouth daily with breakfast. 10 tablet 0   senna (SENOKOT) 8.6 MG TABS tablet Take 2 tablets (17.2 mg total) by mouth at bedtime.     torsemide  (DEMADEX ) 20 MG tablet Take 20 mg by mouth daily.     No current facility-administered medications on file prior to visit.    Review of Systems     Objective:  There were no vitals filed for this visit. BP Readings from Last 3 Encounters:  09/13/23 (!) 140/72  07/13/23 (!) 156/74  05/05/23 (!) 146/63   Wt Readings from Last 3 Encounters:  10/28/23 220 lb (99.8 kg)  09/13/23 220 lb (99.8 kg)  07/13/23 221 lb (100.2 kg)   There is no height or weight on file to calculate BMI.    Physical Exam         Assessment & Plan:    See Problem List for Assessment and Plan of chronic medical problems.

## 2023-12-03 ENCOUNTER — Ambulatory Visit: Admitting: Internal Medicine

## 2023-12-03 ENCOUNTER — Encounter: Payer: Self-pay | Admitting: Internal Medicine

## 2023-12-03 VITALS — BP 130/70 | HR 48 | Temp 98.0°F | Ht 64.0 in | Wt 222.0 lb

## 2023-12-03 DIAGNOSIS — R29898 Other symptoms and signs involving the musculoskeletal system: Secondary | ICD-10-CM | POA: Diagnosis not present

## 2023-12-03 DIAGNOSIS — L89153 Pressure ulcer of sacral region, stage 3: Secondary | ICD-10-CM

## 2023-12-03 DIAGNOSIS — R5381 Other malaise: Secondary | ICD-10-CM

## 2023-12-03 MED ORDER — AMOXICILLIN-POT CLAVULANATE 875-125 MG PO TABS: 1.0000 | ORAL_TABLET | Freq: Two times a day (BID) | ORAL | 0 refills | Status: DC

## 2023-12-03 MED ORDER — TRAMADOL HCL 50 MG PO TABS: 50.0000 mg | ORAL_TABLET | Freq: Three times a day (TID) | ORAL | 0 refills | Status: AC | PRN

## 2023-12-03 NOTE — Patient Instructions (Addendum)
    Keep pressure off the sore area.      Medications changes include :   Augmentin  twice a day x 10 days, Tramadol  for severe pain    A referral was ordered home health nurse  and PT and someone will call you to schedule an appointment.

## 2023-12-06 ENCOUNTER — Ambulatory Visit (INDEPENDENT_AMBULATORY_CARE_PROVIDER_SITE_OTHER): Payer: Self-pay | Admitting: Family Medicine

## 2023-12-06 ENCOUNTER — Other Ambulatory Visit (INDEPENDENT_AMBULATORY_CARE_PROVIDER_SITE_OTHER): Payer: Self-pay

## 2023-12-06 ENCOUNTER — Other Ambulatory Visit (INDEPENDENT_AMBULATORY_CARE_PROVIDER_SITE_OTHER): Payer: Self-pay | Admitting: Family Medicine

## 2023-12-06 DIAGNOSIS — Z7189 Other specified counseling: Secondary | ICD-10-CM

## 2023-12-06 NOTE — Nursing Note (Signed)
 POPULATION HEALTH    COMPLEX CARE MANAGEMENT    Chronic Care Management - CCM  Status: Enrolled  Effective Dates: 12/02/2022 - present  Responsible Staff: Braileigh Landenberger, RN          Patient reports to be doing well. He did have a PCP appointment on 6/10 to review medications. Coreg  was removed from list . Patient was placed on losartan  however started with cough and hoarse voice. Patient then tried hydralazine  and became muscle aches weakness, nausea, and dizziness. Patient than placed back on losartan . Patient BP back to 130/72. Heart rate 45-50. Patient feels better but does not like the side effect. [facility] will look for other rate and BP medications with conditions for research . [facility] will educate on what is found. Patient denies CP and SOB. No other complaints at this time. No medication needs. [facility] will continue to monitor.     Care Management Tasks Completed:  Chart review completed.  Social determinants of health reviewed and updated as needed.  The following Care plan on general health , BP, and cardiac were  reviewed/discussed/updated.  Interventions ongoing and updated as needed.  Goals addressed and updated.  Interventions ongoing and updated as needed.  The following Education on Cardiac, heat exhaustion, and medication were  completed.  Self management plan reviewed and updated.  See patient care coordination note.  Barriers to health, care plan, and goals reviewed.  Interventions ongoing and updated as needed.  The following Health maintenance/care gaps on Vaccinations were reviewed and discussed with patient.  Health maintenance/care gaps updated as applicable.  Discussed upcoming appointment dates and times.  Reinforced importance of keeping all provider appointments.  Reinforced the importance of taking all medications as prescribed.  Medical history, surgical history, hospitalizations, and medications reviewed and updated as applicable.    Ongoing and Updated Assessment Interventions:  Reviewed  medication changes   Encouraged BP checks   Encouraged HR Checks  Discussed side effects   Educated on Heat exhaustion   Reviewed General Health     Case Manager To Do List for the Next Interaction:  Monthly follow up  F/U PCP in July   F/U BP   F/U Heart rate   Confirmed upcoming appointments     Plan to call patient in ~ one month to reassess and update plan of care.  Instructed patient to call with change in symptoms or as needed prior to next follow up.      Ryan Adler, RN

## 2023-12-06 NOTE — Telephone Encounter (Signed)
 Last Visit:11/23/23    Upcoming appointments: Visit date not found           Lanier Felty Emmett, CNA  12/06/2023, 14:09

## 2023-12-07 NOTE — Telephone Encounter (Signed)
-----   Message from Charlie Avena, MD PhD sent at 12/06/2023  5:07 PM EDT -----  Please let the patient know that their Cologuard results were negative.  Recheck in 3 years.    ----- Message -----  From: Edi, Incoming Results [facility]  Sent: 12/06/2023  11:15 AM EDT  To: Lauraine Plants, DO

## 2023-12-07 NOTE — Telephone Encounter (Signed)
 Pt notified and voiced understanding

## 2023-12-11 ENCOUNTER — Other Ambulatory Visit: Payer: Self-pay

## 2023-12-11 ENCOUNTER — Inpatient Hospital Stay (HOSPITAL_COMMUNITY)
Admission: EM | Admit: 2023-12-11 | Discharge: 2023-12-17 | DRG: 602 | Disposition: A | Discharge status: Skilled Nursing Facility | Attending: Family Medicine | Admitting: Family Medicine

## 2023-12-11 ENCOUNTER — Emergency Department (HOSPITAL_COMMUNITY)

## 2023-12-11 DIAGNOSIS — L03317 Cellulitis of buttock: Principal | ICD-10-CM | POA: Diagnosis present

## 2023-12-11 DIAGNOSIS — Z7409 Other reduced mobility: Secondary | ICD-10-CM | POA: Diagnosis present

## 2023-12-11 DIAGNOSIS — L89303 Pressure ulcer of unspecified buttock, stage 3: Secondary | ICD-10-CM

## 2023-12-11 DIAGNOSIS — G43909 Migraine, unspecified, not intractable, without status migrainosus: Secondary | ICD-10-CM | POA: Diagnosis not present

## 2023-12-11 DIAGNOSIS — F419 Anxiety disorder, unspecified: Secondary | ICD-10-CM | POA: Diagnosis not present

## 2023-12-11 DIAGNOSIS — F32A Depression, unspecified: Secondary | ICD-10-CM | POA: Diagnosis not present

## 2023-12-11 DIAGNOSIS — Z86711 Personal history of pulmonary embolism: Secondary | ICD-10-CM

## 2023-12-11 DIAGNOSIS — G9341 Metabolic encephalopathy: Secondary | ICD-10-CM | POA: Diagnosis not present

## 2023-12-11 DIAGNOSIS — I130 Hypertensive heart and chronic kidney disease with heart failure and stage 1 through stage 4 chronic kidney disease, or unspecified chronic kidney disease: Secondary | ICD-10-CM | POA: Diagnosis not present

## 2023-12-11 DIAGNOSIS — L8995 Pressure ulcer of unspecified site, unstageable: Secondary | ICD-10-CM | POA: Diagnosis not present

## 2023-12-11 DIAGNOSIS — I480 Paroxysmal atrial fibrillation: Secondary | ICD-10-CM | POA: Diagnosis present

## 2023-12-11 DIAGNOSIS — J9811 Atelectasis: Secondary | ICD-10-CM | POA: Diagnosis not present

## 2023-12-11 DIAGNOSIS — Z7901 Long term (current) use of anticoagulants: Secondary | ICD-10-CM | POA: Diagnosis not present

## 2023-12-11 DIAGNOSIS — E785 Hyperlipidemia, unspecified: Secondary | ICD-10-CM | POA: Diagnosis not present

## 2023-12-11 DIAGNOSIS — I5033 Acute on chronic diastolic (congestive) heart failure: Principal | ICD-10-CM | POA: Diagnosis present

## 2023-12-11 DIAGNOSIS — E1142 Type 2 diabetes mellitus with diabetic polyneuropathy: Secondary | ICD-10-CM | POA: Diagnosis present

## 2023-12-11 DIAGNOSIS — Z86718 Personal history of other venous thrombosis and embolism: Secondary | ICD-10-CM

## 2023-12-11 DIAGNOSIS — N1832 Chronic kidney disease, stage 3b: Secondary | ICD-10-CM | POA: Diagnosis not present

## 2023-12-11 DIAGNOSIS — Z7401 Bed confinement status: Secondary | ICD-10-CM | POA: Diagnosis not present

## 2023-12-11 DIAGNOSIS — L89309 Pressure ulcer of unspecified buttock, unspecified stage: Principal | ICD-10-CM

## 2023-12-11 DIAGNOSIS — R0600 Dyspnea, unspecified: Secondary | ICD-10-CM | POA: Diagnosis not present

## 2023-12-11 DIAGNOSIS — Z993 Dependence on wheelchair: Secondary | ICD-10-CM

## 2023-12-11 DIAGNOSIS — K219 Gastro-esophageal reflux disease without esophagitis: Secondary | ICD-10-CM | POA: Diagnosis not present

## 2023-12-11 DIAGNOSIS — E1122 Type 2 diabetes mellitus with diabetic chronic kidney disease: Secondary | ICD-10-CM | POA: Diagnosis present

## 2023-12-11 DIAGNOSIS — Z7984 Long term (current) use of oral hypoglycemic drugs: Secondary | ICD-10-CM

## 2023-12-11 DIAGNOSIS — R41841 Cognitive communication deficit: Secondary | ICD-10-CM | POA: Diagnosis not present

## 2023-12-11 DIAGNOSIS — E7800 Pure hypercholesterolemia, unspecified: Secondary | ICD-10-CM | POA: Diagnosis not present

## 2023-12-11 DIAGNOSIS — J9601 Acute respiratory failure with hypoxia: Secondary | ICD-10-CM | POA: Diagnosis not present

## 2023-12-11 DIAGNOSIS — L89313 Pressure ulcer of right buttock, stage 3: Secondary | ICD-10-CM | POA: Diagnosis present

## 2023-12-11 DIAGNOSIS — Z79899 Other long term (current) drug therapy: Secondary | ICD-10-CM

## 2023-12-11 DIAGNOSIS — N400 Enlarged prostate without lower urinary tract symptoms: Secondary | ICD-10-CM | POA: Diagnosis not present

## 2023-12-11 DIAGNOSIS — I700 Atherosclerosis of aorta: Secondary | ICD-10-CM | POA: Diagnosis not present

## 2023-12-11 DIAGNOSIS — M6281 Muscle weakness (generalized): Secondary | ICD-10-CM | POA: Diagnosis not present

## 2023-12-11 DIAGNOSIS — Z6837 Body mass index (BMI) 37.0-37.9, adult: Secondary | ICD-10-CM | POA: Diagnosis not present

## 2023-12-11 DIAGNOSIS — I82593 Chronic embolism and thrombosis of other specified deep vein of lower extremity, bilateral: Secondary | ICD-10-CM | POA: Diagnosis not present

## 2023-12-11 DIAGNOSIS — R2689 Other abnormalities of gait and mobility: Secondary | ICD-10-CM | POA: Diagnosis not present

## 2023-12-11 DIAGNOSIS — G4489 Other headache syndrome: Secondary | ICD-10-CM | POA: Diagnosis not present

## 2023-12-11 DIAGNOSIS — Z792 Long term (current) use of antibiotics: Secondary | ICD-10-CM

## 2023-12-11 DIAGNOSIS — R0902 Hypoxemia: Secondary | ICD-10-CM | POA: Diagnosis not present

## 2023-12-11 DIAGNOSIS — I129 Hypertensive chronic kidney disease with stage 1 through stage 4 chronic kidney disease, or unspecified chronic kidney disease: Secondary | ICD-10-CM | POA: Diagnosis not present

## 2023-12-11 DIAGNOSIS — M7989 Other specified soft tissue disorders: Secondary | ICD-10-CM | POA: Diagnosis not present

## 2023-12-11 DIAGNOSIS — L89323 Pressure ulcer of left buttock, stage 3: Secondary | ICD-10-CM | POA: Diagnosis present

## 2023-12-11 DIAGNOSIS — I10 Essential (primary) hypertension: Secondary | ICD-10-CM | POA: Diagnosis not present

## 2023-12-11 DIAGNOSIS — R609 Edema, unspecified: Secondary | ICD-10-CM

## 2023-12-11 DIAGNOSIS — R0609 Other forms of dyspnea: Secondary | ICD-10-CM | POA: Diagnosis not present

## 2023-12-11 DIAGNOSIS — E66812 Obesity, class 2: Secondary | ICD-10-CM | POA: Diagnosis present

## 2023-12-11 DIAGNOSIS — Z87891 Personal history of nicotine dependence: Secondary | ICD-10-CM

## 2023-12-11 LAB — CBC WITH DIFFERENTIAL/PLATELET
Abs Immature Granulocytes: 0.05 10*3/uL (ref 0.00–0.07)
Basophils Absolute: 0.1 10*3/uL (ref 0.0–0.1)
Basophils Relative: 1 %
Eosinophils Absolute: 0.1 10*3/uL (ref 0.0–0.5)
Eosinophils Relative: 1 %
HCT: 39 % (ref 39.0–52.0)
Hemoglobin: 11 g/dL — ABNORMAL LOW (ref 13.0–17.0)
Immature Granulocytes: 1 %
Lymphocytes Relative: 18 %
Lymphs Abs: 1.6 10*3/uL (ref 0.7–4.0)
MCH: 22.4 pg — ABNORMAL LOW (ref 26.0–34.0)
MCHC: 28.2 g/dL — ABNORMAL LOW (ref 30.0–36.0)
MCV: 79.6 fL — ABNORMAL LOW (ref 80.0–100.0)
Monocytes Absolute: 0.5 10*3/uL (ref 0.1–1.0)
Monocytes Relative: 6 %
Neutro Abs: 6.8 10*3/uL (ref 1.7–7.7)
Neutrophils Relative %: 73 %
Platelets: 226 10*3/uL (ref 150–400)
RBC: 4.9 MIL/uL (ref 4.22–5.81)
RDW: 26 % — ABNORMAL HIGH (ref 11.5–15.5)
WBC: 9.2 10*3/uL (ref 4.0–10.5)
nRBC: 0 % (ref 0.0–0.2)

## 2023-12-11 LAB — COMPREHENSIVE METABOLIC PANEL WITH GFR
ALT: 11 U/L (ref 0–44)
AST: 15 U/L (ref 15–41)
Albumin: 2.7 g/dL — ABNORMAL LOW (ref 3.5–5.0)
Alkaline Phosphatase: 86 U/L (ref 38–126)
Anion gap: 9 (ref 5–15)
BUN: 23 mg/dL (ref 8–23)
CO2: 22 mmol/L (ref 22–32)
Calcium: 8.8 mg/dL — ABNORMAL LOW (ref 8.9–10.3)
Chloride: 107 mmol/L (ref 98–111)
Creatinine, Ser: 1.63 mg/dL — ABNORMAL HIGH (ref 0.61–1.24)
GFR, Estimated: 43 mL/min — ABNORMAL LOW (ref >60)
Glucose, Bld: 72 mg/dL (ref 70–99)
Potassium: 4.6 mmol/L (ref 3.5–5.1)
Sodium: 138 mmol/L (ref 135–145)
Total Bilirubin: 0.7 mg/dL (ref 0.0–1.2)
Total Protein: 5.7 g/dL — ABNORMAL LOW (ref 6.5–8.1)

## 2023-12-11 LAB — TROPONIN I (HIGH SENSITIVITY)
Troponin I (High Sensitivity): 7 ng/L (ref <18)
Troponin I (High Sensitivity): 8 ng/L (ref <18)

## 2023-12-11 LAB — SEDIMENTATION RATE: Sed Rate: 13 mm/h (ref 0–16)

## 2023-12-11 LAB — BRAIN NATRIURETIC PEPTIDE: B Natriuretic Peptide: 427.7 pg/mL — ABNORMAL HIGH (ref 0.0–100.0)

## 2023-12-11 LAB — C-REACTIVE PROTEIN: CRP: 0.7 mg/dL (ref <1.0)

## 2023-12-11 MED ORDER — ACETAMINOPHEN 325 MG PO TABS
650.0000 mg | ORAL_TABLET | Freq: Four times a day (QID) | ORAL | Status: DC | PRN
  Administered 2023-12-12: 650 mg via ORAL
  Filled 2023-12-11: qty 2

## 2023-12-11 MED ORDER — HYDROCODONE-ACETAMINOPHEN 5-325 MG PO TABS
1.0000 | ORAL_TABLET | Freq: Once | ORAL | Status: AC
  Administered 2023-12-11: 1 via ORAL
  Filled 2023-12-11: qty 1

## 2023-12-11 MED ORDER — ONDANSETRON HCL 4 MG PO TABS: 4.0000 mg | ORAL_TABLET | Freq: Four times a day (QID) | ORAL | Status: DC | PRN

## 2023-12-11 MED ORDER — SENNOSIDES-DOCUSATE SODIUM 8.6-50 MG PO TABS
1.0000 | ORAL_TABLET | Freq: Every evening | ORAL | Status: DC | PRN
Start: 2023-12-11 — End: 2023-12-17
  Administered 2023-12-16: 1 via ORAL
  Filled 2023-12-11 (×2): qty 1

## 2023-12-11 MED ORDER — ONDANSETRON HCL 4 MG/2ML IJ SOLN: 4.0000 mg | Freq: Four times a day (QID) | INTRAMUSCULAR | Status: DC | PRN

## 2023-12-11 MED ORDER — ACETAMINOPHEN 650 MG RE SUPP: 650.0000 mg | Freq: Four times a day (QID) | RECTAL | Status: DC | PRN

## 2023-12-11 MED ORDER — FUROSEMIDE 10 MG/ML IJ SOLN
40.0000 mg | Freq: Once | INTRAMUSCULAR | Status: AC
  Administered 2023-12-11: 40 mg via INTRAVENOUS
  Filled 2023-12-11: qty 4

## 2023-12-11 MED ORDER — SODIUM CHLORIDE 0.9 % IV SOLN
2.0000 g | Freq: Once | INTRAVENOUS | Status: AC
  Administered 2023-12-11: 2 g via INTRAVENOUS
  Filled 2023-12-11: qty 20

## 2023-12-11 NOTE — H&P (Signed)
 History and Physical  Tanner [name] FMW:[id number] DOB: May 17, 1946 DOA: 12/11/2023  [facility]   Chief Complaint: Leg swelling, wound check  HPI: Tanner [name] is a 78 y.o. male with medical history significant for A-fib on Eliquis , PE/DVT, chronic diastolic heart failure, HTN, HLD, anxiety and depression, T2DM, C. difficile colitis, CKD 3B, morbid obesity, BPH, wheelchair-bound and stage III pressure ulcer who presents to the ER for evaluation of worsening LE swelling, wound check and dyspnea on exertion. Patient reports that he developed a small pressure ulcer on his buttocks during a hospitalization last December. He is wheelchair-bound and has gradually worsening to stage III pressure ulcer.  He was seen by the PCP 3 weeks ago and prescribed 10-day course of Augmentin . Last night, he noticed some blood from the wound after wiping.  Patient also reports some dyspnea on exertion and worsening lower extremity edema but no shortness of breath at rest. He endorsed mild left-sided headache but denies any fevers, chills, nausea, vomiting, abdominal pain, dysuria or chest pain.  ED Course: Initial vitals show temp 97.4, RR 16, HR 68, BP 167/73, SpO2 95% on room air. Initial labs significant for creatinine 1.63, albumin 2.7, BNP 428, WBC 9.2, Hgb 11.0, normal troponin, CRP and sed rate. CXR shows no acute cardio pulmonary disease.Tanner [name] Pt received IV Rocephin , IV Lasix  40 mg x 1 and Norco 5-325 mg x 1. [facility] was consulted for admission.   Review of Systems: Please see HPI for pertinent positives and negatives. A complete 10 system review of systems are otherwise negative.  Past Medical History:  Diagnosis Date   Acute kidney failure (HCC)    Anxiety    Arthritis    Back pain    BPH (benign prostatic hypertrophy)    CHF (congestive heart failure) (HCC)    Clostridium difficile infection    Clotting disorder (HCC)    Depression    Diabetes (HCC) 12/11/2016   type 2    DVT (deep  venous thrombosis) (HCC)    DVT of deep femoral vein, right (HCC) 06/29/2018   Edema, lower extremity    High cholesterol    Hypertension    Joint pain    Low back pain potentially associated with spinal stenosis    Neuromuscular disorder (HCC)    Neuropathy of lower extremity    bilateral   OSA (obstructive sleep apnea)    pt denies    Osteoarthritis    PE (pulmonary embolism)    Pneumonia    hx of x 2    Ventral hernia    Past Surgical History:  Procedure Laterality Date   BRONCHIAL WASHINGS  04/29/2023   Procedure: BRONCHIAL WASHINGS;  Surgeon: Neda Jennet LABOR, MD;  Location: [facility];  Service: Pulmonary;;   CARDIOVERSION N/A 08/19/2022   Procedure: CARDIOVERSION;  Surgeon: Hobart Powell BRAVO, MD;  Location: [facility];  Service: Cardiovascular;  Laterality: N/A;   EYE SURGERY     HERNIA REPAIR  1999   I & D KNEE WITH POLY EXCHANGE Right 04/07/2023   Procedure: IRRIGATION AND DEBRIDEMENT KNEE WITH POLY EXCHANGE;  Surgeon: Ernie Cough, MD;  Location: [facility] OR;  Service: Orthopedics;  Laterality: Right;   TOTAL KNEE ARTHROPLASTY Left 08/26/2020   Procedure: LEFT TOTAL KNEE ARTHROPLASTY;  Surgeon: Liam Lerner, MD;  Location: [facility] ORS;  Service: Orthopedics;  Laterality: Left;   TOTAL KNEE ARTHROPLASTY Right 12/15/2022   Procedure: TOTAL KNEE ARTHROPLASTY;  Surgeon: Ernie Cough, MD;  Location: [facility] ORS;  Service: Orthopedics;  Laterality: Right;   TRANSESOPHAGEAL ECHOCARDIOGRAM (CATH LAB) N/A 04/12/2023   Procedure: TRANSESOPHAGEAL ECHOCARDIOGRAM;  Surgeon: Mona Vinie BROCKS, MD;  Location: [facility];  Service: Cardiovascular;  Laterality: N/A;   VIDEO BRONCHOSCOPY Bilateral 04/29/2023   Procedure: VIDEO BRONCHOSCOPY WITHOUT FLUORO;  Surgeon: Neda Jennet LABOR, MD;  Location: [facility];  Service: Pulmonary;  Laterality: Bilateral;  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 His smoking use included  cigarettes. He started smoking about 55 years ago. He has a 66.1 pack-year smoking history. He has never used smokeless tobacco. He reports current alcohol use. He reports that he does not use drugs.  Allergies  Allergen Reactions   Lisinopril Other (See Comments)     Renal impairment   Amlodipine Swelling   Codeine Sulfate Itching and Nausea Only    Family History  Problem Relation Age of Onset   Diabetes Mother    Pneumonia Mother    Alzheimer's disease Mother    Hyperlipidemia Mother    Thyroid  disease Mother    Depression Mother    Other Father 30       Drowned on boating accident   Colon cancer Neg Hx    Colon polyps Neg Hx      Prior to Admission medications   Medication Sig Start Date End Date Taking? Authorizing Provider  acetaminophen  (TYLENOL ) 500 MG tablet Take 1,000 mg by mouth every 8 (eight) hours as needed for moderate pain (pain score 4-6). 02/21/13   Norins, Michael E, MD  amiodarone  (PACERONE ) 200 MG tablet TAKE 1 TABLET BY MOUTH DAILY 11/12/23   Rollene Almarie LABOR, MD  amoxicillin -clavulanate (AUGMENTIN ) 875-125 MG tablet Take 1 tablet by mouth 2 (two) times daily for 10 days. 12/03/23 12/13/23  Geofm Glade PARAS, MD  apixaban  (ELIQUIS ) 5 MG TABS tablet Take 5 mg by mouth 2 (two) times daily.    [provider]  atorvastatin  (LIPITOR ) 80 MG tablet Take 80 mg by mouth daily.    [provider]  buPROPion  (WELLBUTRIN  SR) 150 MG 12 hr tablet Take 150 mg by mouth 2 (two) times daily.    [provider]  carvedilol  (COREG ) 6.25 MG tablet TAKE 1 TABLET BY MOUTH 2 TIMES DAILY WITH A MEAL. 11/01/23   Rollene Almarie LABOR, MD  cetirizine  (ZYRTEC ) 10 MG tablet Take 1 tablet (10 mg total) by mouth daily. 08/05/23   Rollene Almarie LABOR, MD  diclofenac Sodium (VOLTAREN) 1 % GEL Apply topically. 11/12/23   [provider]  diphenhydrAMINE  (BENADRYL ) 25 MG tablet Take 25 mg by mouth at bedtime.    [provider]  doxycycline   (VIBRAMYCIN ) 100 MG capsule Take 1 capsule (100 mg total) by mouth 2 (two) times daily. Start doxycycline  AFTER completing the course of linezolid  (Zyvox ) and continue indefinitely 05/20/23   Rai, Nydia POUR, MD  empagliflozin  (JARDIANCE ) 10 MG TABS tablet Take 25 mg by mouth daily. 1/2 tablet daily    [provider]  finasteride  (PROSCAR ) 5 MG tablet Take 5 mg by mouth at bedtime.    [provider]  gabapentin  (NEURONTIN ) 300 MG capsule Take 1 capsule (300 mg total) by mouth 3 (three) times daily. 05/05/23   Rai, Nydia POUR, MD  guaiFENesin  (ROBITUSSIN) 100 MG/5ML liquid Take 5 mLs by mouth every 4 (four) hours as needed for cough or to loosen phlegm.    [provider]  lidocaine  (XYLOCAINE ) 2 % solution  USE AS DIRECTED 15 MLS IN THE MOUTH OR THROAT AS NEEDED FOR MOUTH PAIN. 11/01/23   Rollene Almarie LABOR, MD  lidocaine  (XYLOCAINE ) 4 % external solution Apply topically as needed.    [provider]  Multiple Vitamins-Minerals (MULTIVITAMINS THER. W/MINERALS) TABS Take 1 tablet by mouth daily.    [provider]  nortriptyline  (PAMELOR ) 10 MG capsule Take 20 mg by mouth at bedtime.    [provider]  pantoprazole  (PROTONIX ) 40 MG tablet Take 1 tablet (40 mg total) by mouth daily at 12 noon. 04/14/23   Ghimire, Donalda HERO, MD  polyethylene glycol (MIRALAX  / GLYCOLAX ) 17 g packet Take 17 g by mouth daily. 04/14/23   Ghimire, Donalda HERO, MD  potassium chloride  (KLOR-CON ) 10 MEQ tablet Take 10 mEq by mouth daily. 07/28/23   [provider]  predniSONE  (DELTASONE ) 1 MG tablet Take 1 mg by mouth daily with breakfast.    [provider]  predniSONE  (DELTASONE ) 20 MG tablet Take 2 tablets (40 mg total) by mouth daily with breakfast. 10/13/23   Rollene Almarie LABOR, MD  senna (SENOKOT) 8.6 MG TABS tablet Take 2 tablets (17.2 mg total) by mouth at bedtime. 04/14/23   Ghimire, Donalda HERO, MD  SUMAtriptan (IMITREX) 25 MG tablet Take 25 mg by  mouth. 11/12/23   [provider]  torsemide  (DEMADEX ) 20 MG tablet Take 20 mg by mouth daily.    [provider]    Physical Exam: BP (!) 167/73 (BP Location: Left Arm)   Pulse 68   Temp (!) 97.4 F (36.3 C) (Oral)   Resp 16   SpO2 95%  General: Pleasant, well-appearing elderly man laying in hallway bed. No acute distress. HEENT: [place]/AT. Anicteric sclera CV: RRR. No murmurs, rubs, or gallops. 2-3+ BLE edema Pulmonary: Lungs CTAB. Normal effort. Bibasilar rales Abdominal: Soft, nontender, nondistended. Normal bowel sounds. Extremities: Palpable radial and DP pulses. Normal ROM. Skin: Warm and dry. Hyperpigmentation of the lower extremities. Stage III pressure ulcer of the buttocks with surrounding erythema (see media tab) Neuro: A&Ox3. Moves all extremities. Normal sensation to light touch. No focal deficit. Psych: Normal mood and affect          Labs on Admission:  Basic Metabolic Panel: Recent Labs  Lab 12/11/23 1654  NA 138  K 4.6  CL 107  CO2 22  GLUCOSE 72  BUN 23  CREATININE 1.63*  CALCIUM  8.8*   Liver Function Tests: Recent Labs  Lab 12/11/23 1654  AST 15  ALT 11  ALKPHOS 86  BILITOT 0.7  PROT 5.7*  ALBUMIN 2.7*   No results for input(s): LIPASE, AMYLASE in the last 168 hours. No results for input(s): AMMONIA in the last 168 hours. CBC: Recent Labs  Lab 12/11/23 1654  WBC 9.2  NEUTROABS 6.8  HGB 11.0*  HCT 39.0  MCV 79.6*  PLT 226   Cardiac Enzymes: No results for input(s): CKTOTAL, CKMB, CKMBINDEX, TROPONINI in the last 168 hours. BNP (last 3 results) Recent Labs    04/06/23 1415 04/25/23 0305 12/11/23 1716  BNP 280.2* 174.5* 427.7*    ProBNP (last 3 results) No results for input(s): PROBNP in the last 8760 hours.  CBG: No results for input(s): GLUCAP in the last 168 hours.  Radiological Exams on Admission: DG Chest Portable 1 View Result Date: 12/11/2023 CLINICAL DATA:  dyspnea, concern for pulm  edema EXAM: PORTABLE CHEST - 1 VIEW COMPARISON:  April 24, 2023 FINDINGS: Streaky bibasilar atelectasis. No focal airspace consolidation, pleural  effusion, or pneumothorax. No cardiomegaly. Aortic atherosclerosis. No acute fracture or destructive lesions. Multilevel thoracic osteophytosis. IMPRESSION: No acute cardiopulmonary abnormality. Electronically Signed   By: Rogelia Myers M.D.   On: 12/11/2023 16:57   Assessment/Plan Tanner [name] is a 78 y.o. male with medical history significant for  A-fib on Eliquis , PE/DVT, chronic diastolic heart failure, HTN, HLD, anxiety and depression, T2DM, C. difficile colitis, CKD 3B, morbid obesity, BPH, wheelchair-bound and stage III pressure ulcer who presents to the ER for evaluation of worsening LE swelling, wound check and dyspnea on exertion and admitted for acute on chronic diastolic heart failure and cellulitis  # Acute on chronic diastolic heart failure - Last TTE on October 2024 shows EF 60-65%, normal diastolic parameters and no valvular abnormality - Pt presented with lower extremity swelling and dyspnea on exertion - Pt with laboratory and clinical signs of CHF exacerbation - Patient on torsemide  20 mg daily at home - Start IV lasix  40 mg twice daily - Continue Coreg  and potassium supplementation - Follow up repeat echocardiogram - Strict I&O, daily weights - Apply TED hose - Maintain K+ > 4.0, Mag > 2.0 - Telemetry  # Cellulitis # Stage III pressure ulcer - Currently on 10-day course of Augmentin  with end date of 6/30 - On exam, there is evidence of mild cellulitis superimposed on his pressure ulcer - Continue IV Rocephin  - Trend CBC, fever curve - Wound care consulted, appreciate recs  # A-fib - EKG on admission shows sinus rhythm with first-degree AV block with RBBB and LAFB - HR stable in the 60s to 70s - Continue Coreg , amiodarone  and Eliquis  - Trend and replete electrolytes - Telemetry  # PE/DVT - Continue Eliquis   #  HTN - BP elevated with SBP in the 160s to 170s - Continue Coreg   # HLD - Continue atorvastatin   # Anxiety and depression - Continue bupropion  and nortriptyline   # BPH - Continue finasteride   # GERD - Continue Protonix   # Migraine headache - Reported mild left-sided headache on admission - Continue as needed sumatriptan  DVT prophylaxis: Eliquis     Code Status: Full Code  Consults called: None  Family Communication: Discussed admission with son at bedside  Severity of Illness: The appropriate patient status for this patient is INPATIENT. Inpatient status is judged to be reasonable and necessary in order to provide the required intensity of service to ensure the patient's safety. [name] presenting symptoms, physical exam findings, and initial radiographic and laboratory data in the context of their chronic comorbidities is felt to place them at high risk for further clinical deterioration. Furthermore, it is not anticipated that the patient will be medically stable for discharge from the hospital within 2 midnights of admission.   * I certify that at the point of admission it is my clinical judgment that the patient will require inpatient hospital care spanning beyond 2 midnights from the point of admission due to high intensity of service, high risk for further deterioration and high frequency of surveillance required.*  Level of care: Telemetry   This record has been created using Conservation officer, historic buildings. Errors have been sought and corrected, but may not always be located. Such creation errors do not reflect on the standard of care.   Tanner Claretta HERO, MD 12/11/2023, 6:53 PM [facility] Pager: [phone] Isaiah 41:10   If 7PM-7AM, please contact night-coverage [email or web address] Password [email or web address]

## 2023-12-11 NOTE — ED Triage Notes (Signed)
 Pt arrives today BIB EMS for wound check. Was given augmentin  and tramadol  for stg3 sacral wound by PCP states no improvement. Uses wheelchair to get around at home, lives alone. Pt endorses he had some chest pain/sob (used 3Lo2 yesterday) but no cp today. Also c/o intermittent L sided headache ongoing for a few days. Pt did not take his medications today due to not feeling well, also has edema bilateral lower extremities, not new but worsened

## 2023-12-11 NOTE — ED Notes (Signed)
 Changed pt with help of tech. Pt able to roll independently in bed, has stage 3 breakdown on buttocks bilaterally, cleaned up for incontinent episode

## 2023-12-11 NOTE — ED Notes (Signed)
 Pt back from x-ray.

## 2023-12-11 NOTE — ED Provider Notes (Signed)
 [facility] Provider Note  CSN: [id number] Arrival date & time: 12/11/23 1522  Chief Complaint(s) Wound Check  HPI Tanner [name] is a 78 y.o. male with PMH PE on Eliquis , paroxysmal A-fib, T2DM, C. difficile colitis, CKD 3, morbid obesity, wheelchair-bound who presents emergency room for evaluation of multiple complaints including lower extremity edema, shortness of breath and a wound near the rectum.  Patient states that he saw his primary care physician on 12/03/2023 and was started on Augmentin  for a pressure wound.  States that he feels that his pain is worsening and he started to notice some bleeding from the wound.  Also endorsing worsening dyspnea with exertion and worsening lower extremity edema.  Endorses headache but denies chest pain, abdominal pain, nausea, vomiting or other systemic symptoms.   Past Medical History Past Medical History:  Diagnosis Date   Acute kidney failure (HCC)    Anxiety    Arthritis    Back pain    BPH (benign prostatic hypertrophy)    CHF (congestive heart failure) (HCC)    Clostridium difficile infection    Clotting disorder (HCC)    Depression    Diabetes (HCC) 12/11/2016   type 2    DVT (deep venous thrombosis) (HCC)    DVT of deep femoral vein, right (HCC) 06/29/2018   Edema, lower extremity    High cholesterol    Hypertension    Joint pain    Low back pain potentially associated with spinal stenosis    Neuromuscular disorder (HCC)    Neuropathy of lower extremity    bilateral   OSA (obstructive sleep apnea)    pt denies    Osteoarthritis    PE (pulmonary embolism)    Pneumonia    hx of x 2    Ventral hernia    Patient Active Problem List   Diagnosis Date Noted   Cellulitis of buttock 12/12/2023   Pressure injury of buttock, stage 3 (HCC) 12/12/2023   Acute on chronic diastolic (congestive) heart failure (HCC) 12/11/2023   Cellulitis and abscess of right leg 09/13/2023   Renal mass  05/05/2023   History of MRSA infection 04/24/2023   Lung nodule 04/24/2023   Generalized weakness 04/07/2023   Hyperlipidemia 04/07/2023   MRSA bacteremia 04/07/2023   Streptococcal bacteremia 04/07/2023   Infection of prosthetic right knee joint (HCC) 04/06/2023   S/P total knee arthroplasty, right 12/15/2022   Paroxysmal atrial fibrillation (HCC) 08/19/2022   Jaw pain 06/24/2022   Left hand pain 05/29/2022   Chronic systolic CHF (congestive heart failure) (HCC) 02/23/2022   Moderate mitral regurgitation 02/23/2022   Leg swelling 01/29/2022   ED (erectile dysfunction) 01/13/2021   Cyst, dermoid, scalp and neck 01/13/2021   Chronic kidney disease (CKD), stage III (moderate) (HCC) 12/13/2019   Carpal tunnel syndrome 12/05/2019   Aortic atherosclerosis (HCC) 11/15/2019   Peripheral neuropathy 03/17/2017   DM2 (diabetes mellitus, type 2) (HCC) 12/11/2016   Allergic rhinitis 12/11/2016   Degenerative arthritis of left knee 01/30/2016   Morbid obesity (HCC) 01/30/2016   Routine general medical examination at a health care facility 04/20/2014   History of pulmonary embolus (PE) 12/19/2013   Anxiety state 03/29/2007   Depression 03/29/2007   OSA (obstructive sleep apnea) 03/29/2007   Unspecified glaucoma 03/29/2007   BPH (benign prostatic hyperplasia) 03/29/2007   OA (osteoarthritis) of knee 03/29/2007   Essential hypertension 03/29/2007   History of Bell's palsy 03/29/2007   Home Medication(s) Prior to Admission  medications   Medication Sig Start Date End Date Taking? Authorizing Provider  acetaminophen  (TYLENOL ) 500 MG tablet Take 1,000 mg by mouth every 8 (eight) hours as needed for moderate pain (pain score 4-6). 02/21/13  Yes Norins, Michael E, MD  amiodarone  (PACERONE ) 200 MG tablet TAKE 1 TABLET BY MOUTH DAILY Patient taking differently: Take 200 mg by mouth in the morning. 11/12/23  Yes Rollene Almarie LABOR, MD  amoxicillin -clavulanate (AUGMENTIN ) 875-125 MG tablet Take 1  tablet by mouth 2 (two) times daily for 10 days. 12/03/23 12/13/23 Yes Burns, Glade PARAS, MD  apixaban  (ELIQUIS ) 5 MG TABS tablet Take 5 mg by mouth 2 (two) times daily.   Yes [provider]  atorvastatin  (LIPITOR ) 80 MG tablet Take 80 mg by mouth in the morning.   Yes [provider]  buPROPion  (WELLBUTRIN  SR) 150 MG 12 hr tablet Take 150 mg by mouth 2 (two) times daily.   Yes [provider]  carvedilol  (COREG ) 6.25 MG tablet TAKE 1 TABLET BY MOUTH 2 TIMES DAILY WITH A MEAL. 11/01/23  Yes Rollene Almarie LABOR, MD  cetirizine  (ZYRTEC ) 10 MG tablet Take 1 tablet (10 mg total) by mouth daily. 08/05/23  Yes Rollene Almarie LABOR, MD  diclofenac Sodium (VOLTAREN) 1 % GEL Apply 2 g topically as needed. 11/12/23  Yes [provider]  diphenhydrAMINE  (BENADRYL ) 25 MG tablet Take 25 mg by mouth at bedtime.   Yes [provider]  doxycycline  (VIBRAMYCIN ) 100 MG capsule Take 1 capsule (100 mg total) by mouth 2 (two) times daily. Start doxycycline  AFTER completing the course of linezolid  (Zyvox ) and continue indefinitely 05/20/23  Yes Rai, Ripudeep K, MD  empagliflozin  (JARDIANCE ) 10 MG TABS tablet Take 5 mg by mouth daily.   Yes [provider]  finasteride  (PROSCAR ) 5 MG tablet Take 5 mg by mouth at bedtime.   Yes [provider]  gabapentin  (NEURONTIN ) 300 MG capsule Take 1 capsule (300 mg total) by mouth 3 (three) times daily. Patient taking differently: Take 300-600 mg by mouth 3 (three) times daily. 600mg  tid , 1 tab qhs 05/05/23  Yes Rai, Ripudeep K, MD  guaiFENesin  (ROBITUSSIN) 100 MG/5ML liquid Take 5 mLs by mouth every 4 (four) hours as needed for cough or to loosen phlegm.   Yes [provider]  lidocaine  (XYLOCAINE ) 2 % solution USE AS DIRECTED 15 MLS IN THE MOUTH OR THROAT AS NEEDED FOR MOUTH PAIN. 11/01/23  Yes Rollene Almarie LABOR, MD  Multiple Vitamins-Minerals (MULTIVITAMINS THER. W/MINERALS) TABS Take 1 tablet by mouth every  evening.   Yes [provider]  nortriptyline  (PAMELOR ) 10 MG capsule Take 20 mg by mouth at bedtime.   Yes [provider]  pantoprazole  (PROTONIX ) 40 MG tablet Take 1 tablet (40 mg total) by mouth daily at 12 noon. 04/14/23  Yes Ghimire, Donalda HERO, MD  polyethylene glycol (MIRALAX  / GLYCOLAX ) 17 g packet Take 17 g by mouth daily. Patient taking differently: Take 17 g by mouth as needed for mild constipation or moderate constipation. 04/14/23  Yes Ghimire, Donalda HERO, MD  potassium chloride  (KLOR-CON ) 10 MEQ tablet Take 10 mEq by mouth every evening. 07/28/23  Yes [provider]  senna (SENOKOT) 8.6 MG TABS tablet Take 2 tablets (17.2 mg total) by mouth at bedtime. Patient taking differently: Take 2 tablets by mouth as needed for mild constipation or moderate constipation. 04/14/23  Yes Ghimire, Donalda HERO, MD  SUMAtriptan (IMITREX) 25 MG tablet Take 25 mg by mouth every 2 (two)  hours as needed for migraine. 11/12/23  Yes [provider]  torsemide  (DEMADEX ) 20 MG tablet Take 20 mg by mouth in the morning.   Yes [provider]                                                                                                                                    Past Surgical History Past Surgical History:  Procedure Laterality Date   BRONCHIAL WASHINGS  04/29/2023   Procedure: BRONCHIAL WASHINGS;  Surgeon: Neda Jennet LABOR, MD;  Location: [facility] ENDOSCOPY;  Service: Pulmonary;;   CARDIOVERSION N/A 08/19/2022   Procedure: CARDIOVERSION;  Surgeon: Hobart Powell BRAVO, MD;  Location: [facility] ENDOSCOPY;  Service: Cardiovascular;  Laterality: N/A;   EYE SURGERY     HERNIA REPAIR  1999   I & D KNEE WITH POLY EXCHANGE Right 04/07/2023   Procedure: IRRIGATION AND DEBRIDEMENT KNEE WITH POLY EXCHANGE;  Surgeon: Ernie Cough, MD;  Location: [facility] OR;  Service: Orthopedics;  Laterality: Right;   TOTAL KNEE ARTHROPLASTY Left 08/26/2020   Procedure: LEFT TOTAL KNEE ARTHROPLASTY;   Surgeon: Liam Lerner, MD;  Location: [facility] ORS;  Service: Orthopedics;  Laterality: Left;   TOTAL KNEE ARTHROPLASTY Right 12/15/2022   Procedure: TOTAL KNEE ARTHROPLASTY;  Surgeon: Ernie Cough, MD;  Location: [facility] ORS;  Service: Orthopedics;  Laterality: Right;   TRANSESOPHAGEAL ECHOCARDIOGRAM (CATH LAB) N/A 04/12/2023   Procedure: TRANSESOPHAGEAL ECHOCARDIOGRAM;  Surgeon: Mona Vinie BROCKS, MD;  Location: [facility] INVASIVE CV LAB;  Service: Cardiovascular;  Laterality: N/A;   VIDEO BRONCHOSCOPY Bilateral 04/29/2023   Procedure: VIDEO BRONCHOSCOPY WITHOUT FLUORO;  Surgeon: Neda Jennet LABOR, MD;  Location: [facility] ENDOSCOPY;  Service: Pulmonary;  Laterality: Bilateral;  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   Family History Family History  Problem Relation Age of Onset   Diabetes Mother    Pneumonia Mother    Alzheimer's disease Mother    Hyperlipidemia Mother    Thyroid  disease Mother    Depression Mother    Other Father 81       Drowned on boating accident   Colon cancer Neg Hx    Colon polyps Neg Hx     Social History Social History   Tobacco Use   Smoking status: Former    Current packs/day: 0.00    Average packs/day: 2.0 packs/day for 33.0 years (66.1 ttl pk-yrs)    Types: Cigarettes    Start date: 08/30/1968    Quit date: 07/02/2001    Years since quitting: 22.4   Smokeless tobacco: Never   Tobacco comments:    quit 12 years ago  Vaping Use   Vaping status: Never Used  Substance Use Topics   Alcohol use: Yes    Comment: seldom    Drug use: No   Allergies Lisinopril, Amlodipine, and Codeine sulfate  Review of Systems Review of Systems  Respiratory:  Positive for shortness of breath.   Cardiovascular:  Positive for leg swelling.  Skin:  Positive for wound.  Neurological:  Positive for headaches.    Physical Exam Vital Signs  I have reviewed the triage vital signs BP (!) 163/68 (BP Location: Left Arm)   Pulse 69   Temp 98.3 F (36.8 C) (Oral)   Resp 18   SpO2 95%    Physical Exam Constitutional:      General: He is not in acute distress.    Appearance: Normal appearance.  HENT:     Head: Normocephalic and atraumatic.     Nose: No congestion or rhinorrhea.   Eyes:     General:        Right eye: No discharge.        Left eye: No discharge.     Extraocular Movements: Extraocular movements intact.     Pupils: Pupils are equal, round, and reactive to light.    Cardiovascular:     Rate and Rhythm: Normal rate and regular rhythm.     Heart sounds: No murmur heard. Pulmonary:     Effort: No respiratory distress.     Breath sounds: No wheezing or rales.  Abdominal:     General: There is no distension.     Tenderness: There is no abdominal tenderness.   Musculoskeletal:        General: Normal range of motion.     Cervical back: Normal range of motion.     Right lower leg: Edema present.     Left lower leg: Edema present.   Skin:    General: Skin is warm and dry.     Findings: Lesion present.   Neurological:     General: No focal deficit present.     Mental Status: He is alert.      ED Results and Treatments Labs (all labs ordered are listed, but only abnormal results are displayed) Labs Reviewed  COMPREHENSIVE METABOLIC PANEL WITH GFR - Abnormal; Notable for the following components:      Result Value   Creatinine, Ser 1.63 (*)    Calcium  8.8 (*)    Total Protein 5.7 (*)    Albumin 2.7 (*)    GFR, Estimated 43 (*)    All other components within normal limits  CBC WITH DIFFERENTIAL/PLATELET - Abnormal; Notable for the following components:   Hemoglobin 11.0 (*)    MCV 79.6 (*)    MCH 22.4 (*)    MCHC 28.2 (*)    RDW 26.0 (*)    All other components within normal limits  BRAIN NATRIURETIC PEPTIDE - Abnormal; Notable for the following components:   B Natriuretic Peptide 427.7 (*)    All other components within normal limits  SEDIMENTATION RATE  C-REACTIVE PROTEIN  MAGNESIUM   PHOSPHORUS  BASIC METABOLIC PANEL WITH GFR   CBC  TROPONIN I (HIGH SENSITIVITY)  TROPONIN I (HIGH SENSITIVITY)  Radiology DG Chest Portable 1 View Result Date: 12/11/2023 CLINICAL DATA:  dyspnea, concern for pulm edema EXAM: PORTABLE CHEST - 1 VIEW COMPARISON:  April 24, 2023 FINDINGS: Streaky bibasilar atelectasis. No focal airspace consolidation, pleural effusion, or pneumothorax. No cardiomegaly. Aortic atherosclerosis. No acute fracture or destructive lesions. Multilevel thoracic osteophytosis. IMPRESSION: No acute cardiopulmonary abnormality. Electronically Signed   By: Rogelia Myers M.D.   On: 12/11/2023 16:57    Pertinent labs & imaging results that were available during my care of the patient were reviewed by me and considered in my medical decision making (see MDM for details).  Medications Ordered in ED Medications  acetaminophen  (TYLENOL ) tablet 650 mg (650 mg Oral Given 12/12/23 0058)    Or  acetaminophen  (TYLENOL ) suppository 650 mg ( Rectal See Alternative 12/12/23 0058)  senna-docusate (Senokot-S) tablet 1 tablet (has no administration in time range)  ondansetron  (ZOFRAN ) tablet 4 mg (has no administration in time range)    Or  ondansetron  (ZOFRAN ) injection 4 mg (has no administration in time range)  SUMAtriptan (IMITREX) tablet 25 mg (has no administration in time range)  amiodarone  (PACERONE ) tablet 200 mg (has no administration in time range)  atorvastatin  (LIPITOR ) tablet 80 mg (has no administration in time range)  carvedilol  (COREG ) tablet 6.25 mg (has no administration in time range)  buPROPion  (WELLBUTRIN  SR) 12 hr tablet 150 mg (150 mg Oral Given 12/12/23 0058)  nortriptyline  (PAMELOR ) capsule 20 mg (20 mg Oral Given 12/12/23 0059)  pantoprazole  (PROTONIX ) EC tablet 40 mg (has no administration in time range)  finasteride  (PROSCAR ) tablet 5 mg (5 mg Oral Given 12/12/23 0058)  apixaban   (ELIQUIS ) tablet 5 mg (5 mg Oral Given 12/12/23 0058)  gabapentin  (NEURONTIN ) capsule 300 mg (300 mg Oral Given 12/12/23 0059)  multivitamin with minerals tablet 1 tablet (has no administration in time range)  potassium chloride  (KLOR-CON ) CR tablet 10 mEq (has no administration in time range)  loratadine  (CLARITIN ) tablet 10 mg (has no administration in time range)  diphenhydrAMINE  (BENADRYL ) capsule 25 mg (25 mg Oral Given 12/12/23 0058)  cefTRIAXone  (ROCEPHIN ) 1 g in sodium chloride  0.9 % 100 mL IVPB (has no administration in time range)  gabapentin  (NEURONTIN ) capsule 600 mg (has no administration in time range)  furosemide  (LASIX ) injection 40 mg (has no administration in time range)  HYDROcodone -acetaminophen  (NORCO/VICODIN) 5-325 MG per tablet 1 tablet (1 tablet Oral Given 12/11/23 1629)  furosemide  (LASIX ) injection 40 mg (40 mg Intravenous Given 12/11/23 1847)  cefTRIAXone  (ROCEPHIN ) 2 g in sodium chloride  0.9 % 100 mL IVPB (0 g Intravenous Stopping previously hung infusion 12/11/23 2051)                                                                                                                                     Procedures Procedures  (including critical care time)  Medical Decision Making / ED Course   This patient presents to the ED  for concern of shortness of breath, leg swelling, sacral wound, this involves an extensive number of treatment options, and is a complaint that carries with it a high risk of complications and morbidity.  The differential diagnosis includes pressure ulcer, cellulitis, osteomyelitis, wound infection, CHF exacerbation, hypoalbuminemia, renal failure  MDM: Patient seen emerged from for evaluation of multiple complaints as described above.  Physical exam with bilateral lower extremity pitting edema, stage II for stage III pressure ulcer around the rectum as seen in the picture above.  Laboratory evaluation with a hemoglobin of 11.0, creatinine 1.63,  albumin 2.7, ESR, CRP negative, BNP elevated to 427.7, high-sensitivity troponin is normal.  Chest x-ray unremarkable.  Patient given IV Lasix  for diuresis and started on ceftriaxone  for his prior TURP.  Will require hospital admission for shortness of breath and fluid overload with failure of outpatient diuretic regimen and developing pressure ulcer.  Patient admitted   Additional history obtained: -Additional history obtained from family member -External records from outside source obtained and reviewed including: Chart review including previous notes, labs, imaging, consultation notes   Lab Tests: -I ordered, reviewed, and interpreted labs.   The pertinent results include:   Labs Reviewed  COMPREHENSIVE METABOLIC PANEL WITH GFR - Abnormal; Notable for the following components:      Result Value   Creatinine, Ser 1.63 (*)    Calcium  8.8 (*)    Total Protein 5.7 (*)    Albumin 2.7 (*)    GFR, Estimated 43 (*)    All other components within normal limits  CBC WITH DIFFERENTIAL/PLATELET - Abnormal; Notable for the following components:   Hemoglobin 11.0 (*)    MCV 79.6 (*)    MCH 22.4 (*)    MCHC 28.2 (*)    RDW 26.0 (*)    All other components within normal limits  BRAIN NATRIURETIC PEPTIDE - Abnormal; Notable for the following components:   B Natriuretic Peptide 427.7 (*)    All other components within normal limits  SEDIMENTATION RATE  C-REACTIVE PROTEIN  MAGNESIUM   PHOSPHORUS  BASIC METABOLIC PANEL WITH GFR  CBC  TROPONIN I (HIGH SENSITIVITY)  TROPONIN I (HIGH SENSITIVITY)      Imaging Studies ordered: I ordered imaging studies including CXR I independently visualized and interpreted imaging. I agree with the radiologist interpretation   Medicines ordered and prescription drug management: Meds ordered this encounter  Medications   HYDROcodone -acetaminophen  (NORCO/VICODIN) 5-325 MG per tablet 1 tablet    Refill:  0   furosemide  (LASIX ) injection 40 mg    cefTRIAXone  (ROCEPHIN ) 2 g in sodium chloride  0.9 % 100 mL IVPB    Antibiotic Indication::   Cellulitis   OR Linked Order Group    acetaminophen  (TYLENOL ) tablet 650 mg    acetaminophen  (TYLENOL ) suppository 650 mg   senna-docusate (Senokot-S) tablet 1 tablet   OR Linked Order Group    ondansetron  (ZOFRAN ) tablet 4 mg    ondansetron  (ZOFRAN ) injection 4 mg   SUMAtriptan (IMITREX) tablet 25 mg   amiodarone  (PACERONE ) tablet 200 mg   atorvastatin  (LIPITOR ) tablet 80 mg   carvedilol  (COREG ) tablet 6.25 mg   buPROPion  (WELLBUTRIN  SR) 12 hr tablet 150 mg   nortriptyline  (PAMELOR ) capsule 20 mg   pantoprazole  (PROTONIX ) EC tablet 40 mg   finasteride  (PROSCAR ) tablet 5 mg   apixaban  (ELIQUIS ) tablet 5 mg   gabapentin  (NEURONTIN ) capsule 300 mg    Patient taking differently: 600mg  tid , 1 tab qhs     multivitamin with minerals  tablet 1 tablet   potassium chloride  (KLOR-CON ) CR tablet 10 mEq   loratadine  (CLARITIN ) tablet 10 mg   diphenhydrAMINE  (BENADRYL ) capsule 25 mg   cefTRIAXone  (ROCEPHIN ) 1 g in sodium chloride  0.9 % 100 mL IVPB    Antibiotic Indication::   Cellulitis   gabapentin  (NEURONTIN ) capsule 600 mg   furosemide  (LASIX ) injection 40 mg    -I have reviewed the patients home medicines and have made adjustments as needed  Critical interventions none  Social Determinants of Health:  Factors impacting patients care include: none   Reevaluation: After the interventions noted above, I reevaluated the patient and found that they have :improved  Co morbidities that complicate the patient evaluation  Past Medical History:  Diagnosis Date   Acute kidney failure (HCC)    Anxiety    Arthritis    Back pain    BPH (benign prostatic hypertrophy)    CHF (congestive heart failure) (HCC)    Clostridium difficile infection    Clotting disorder (HCC)    Depression    Diabetes (HCC) 12/11/2016   type 2    DVT (deep venous thrombosis) (HCC)    DVT of deep femoral vein, right  (HCC) 06/29/2018   Edema, lower extremity    High cholesterol    Hypertension    Joint pain    Low back pain potentially associated with spinal stenosis    Neuromuscular disorder (HCC)    Neuropathy of lower extremity    bilateral   OSA (obstructive sleep apnea)    pt denies    Osteoarthritis    PE (pulmonary embolism)    Pneumonia    hx of x 2    Ventral hernia       Dispostion: I considered admission for this patient, and patient will require hospital admission for fluid overload and pressure ulcer     Final Clinical Impression(s) / ED Diagnoses Final diagnoses:  Pressure injury of skin of buttock, unspecified injury stage, unspecified laterality  Edema, unspecified type     @PCDICTATION @    Albertina Dixon, MD 12/12/23 0131

## 2023-12-11 NOTE — ED Notes (Signed)
 Denies chest pain and sob at this time

## 2023-12-12 ENCOUNTER — Encounter (HOSPITAL_COMMUNITY): Payer: Self-pay | Admitting: Student

## 2023-12-12 ENCOUNTER — Inpatient Hospital Stay (HOSPITAL_COMMUNITY)

## 2023-12-12 DIAGNOSIS — L89303 Pressure ulcer of unspecified buttock, stage 3: Secondary | ICD-10-CM

## 2023-12-12 DIAGNOSIS — R0609 Other forms of dyspnea: Secondary | ICD-10-CM

## 2023-12-12 DIAGNOSIS — L03317 Cellulitis of buttock: Secondary | ICD-10-CM

## 2023-12-12 DIAGNOSIS — I5033 Acute on chronic diastolic (congestive) heart failure: Secondary | ICD-10-CM | POA: Diagnosis not present

## 2023-12-12 LAB — CBC
HCT: 37.5 % — ABNORMAL LOW (ref 39.0–52.0)
Hemoglobin: 10.5 g/dL — ABNORMAL LOW (ref 13.0–17.0)
MCH: 22.7 pg — ABNORMAL LOW (ref 26.0–34.0)
MCHC: 28 g/dL — ABNORMAL LOW (ref 30.0–36.0)
MCV: 81.2 fL (ref 80.0–100.0)
Platelets: 222 10*3/uL (ref 150–400)
RBC: 4.62 MIL/uL (ref 4.22–5.81)
RDW: 26.1 % — ABNORMAL HIGH (ref 11.5–15.5)
WBC: 9.4 10*3/uL (ref 4.0–10.5)
nRBC: 0 % (ref 0.0–0.2)

## 2023-12-12 LAB — ECHOCARDIOGRAM COMPLETE
AR max vel: 2.76 cm2
AV Area VTI: 2.89 cm2
AV Area mean vel: 2.63 cm2
AV Mean grad: 4 mmHg
AV Peak grad: 8.6 mmHg
Ao pk vel: 1.47 m/s
Area-P 1/2: 3.93 cm2
Height: 64 in
P 1/2 time: 516 ms
S' Lateral: 3.3 cm
Weight: 3485.03 [oz_av]

## 2023-12-12 LAB — BASIC METABOLIC PANEL WITH GFR
Anion gap: 10 (ref 5–15)
BUN: 25 mg/dL — ABNORMAL HIGH (ref 8–23)
CO2: 23 mmol/L (ref 22–32)
Calcium: 8.5 mg/dL — ABNORMAL LOW (ref 8.9–10.3)
Chloride: 105 mmol/L (ref 98–111)
Creatinine, Ser: 1.66 mg/dL — ABNORMAL HIGH (ref 0.61–1.24)
GFR, Estimated: 42 mL/min — ABNORMAL LOW (ref >60)
Glucose, Bld: 83 mg/dL (ref 70–99)
Potassium: 4.3 mmol/L (ref 3.5–5.1)
Sodium: 138 mmol/L (ref 135–145)

## 2023-12-12 LAB — PHOSPHORUS: Phosphorus: 3.8 mg/dL (ref 2.5–4.6)

## 2023-12-12 LAB — MAGNESIUM: Magnesium: 2 mg/dL (ref 1.7–2.4)

## 2023-12-12 MED ORDER — POTASSIUM CHLORIDE ER 10 MEQ PO TBCR
10.0000 meq | EXTENDED_RELEASE_TABLET | Freq: Every evening | ORAL | Status: DC
  Administered 2023-12-12 – 2023-12-16 (×5): 10 meq via ORAL
  Filled 2023-12-12 (×11): qty 1

## 2023-12-12 MED ORDER — CARVEDILOL 6.25 MG PO TABS
6.2500 mg | ORAL_TABLET | Freq: Two times a day (BID) | ORAL | Status: DC
  Administered 2023-12-12 – 2023-12-17 (×11): 6.25 mg via ORAL
  Filled 2023-12-12 (×12): qty 1

## 2023-12-12 MED ORDER — ADULT MULTIVITAMIN W/MINERALS CH
1.0000 | ORAL_TABLET | Freq: Every evening | ORAL | Status: DC
  Administered 2023-12-12 – 2023-12-17 (×6): 1 via ORAL
  Filled 2023-12-12 (×7): qty 1

## 2023-12-12 MED ORDER — GABAPENTIN 300 MG PO CAPS
600.0000 mg | ORAL_CAPSULE | Freq: Three times a day (TID) | ORAL | Status: DC
  Administered 2023-12-12 – 2023-12-17 (×15): 600 mg via ORAL
  Filled 2023-12-12 (×14): qty 2

## 2023-12-12 MED ORDER — PANTOPRAZOLE SODIUM 40 MG PO TBEC
40.0000 mg | DELAYED_RELEASE_TABLET | Freq: Every day | ORAL | Status: DC
  Administered 2023-12-12 – 2023-12-17 (×6): 40 mg via ORAL
  Filled 2023-12-12 (×6): qty 1

## 2023-12-12 MED ORDER — BUPROPION HCL ER (SR) 150 MG PO TB12
150.0000 mg | ORAL_TABLET | Freq: Two times a day (BID) | ORAL | Status: DC
  Administered 2023-12-12 – 2023-12-17 (×12): 150 mg via ORAL
  Filled 2023-12-12 (×12): qty 1

## 2023-12-12 MED ORDER — LORATADINE 10 MG PO TABS
10.0000 mg | ORAL_TABLET | Freq: Every day | ORAL | Status: DC
  Administered 2023-12-12 – 2023-12-17 (×6): 10 mg via ORAL
  Filled 2023-12-12 (×7): qty 1

## 2023-12-12 MED ORDER — APIXABAN 5 MG PO TABS
5.0000 mg | ORAL_TABLET | Freq: Two times a day (BID) | ORAL | Status: DC
  Administered 2023-12-12 – 2023-12-17 (×12): 5 mg via ORAL
  Filled 2023-12-12 (×12): qty 1

## 2023-12-12 MED ORDER — NORTRIPTYLINE HCL 10 MG PO CAPS
20.0000 mg | ORAL_CAPSULE | Freq: Every day | ORAL | Status: DC
  Administered 2023-12-12 – 2023-12-16 (×6): 20 mg via ORAL
  Filled 2023-12-12 (×7): qty 2

## 2023-12-12 MED ORDER — FUROSEMIDE 10 MG/ML IJ SOLN
40.0000 mg | Freq: Two times a day (BID) | INTRAMUSCULAR | Status: DC
  Administered 2023-12-12 (×2): 40 mg via INTRAVENOUS
  Filled 2023-12-12 (×2): qty 4

## 2023-12-12 MED ORDER — SODIUM CHLORIDE 0.9 % IV SOLN
1.0000 g | INTRAVENOUS | Status: DC
  Administered 2023-12-12 – 2023-12-16 (×5): 1 g via INTRAVENOUS
  Filled 2023-12-12 (×5): qty 10

## 2023-12-12 MED ORDER — GABAPENTIN 300 MG PO CAPS
300.0000 mg | ORAL_CAPSULE | Freq: Every day | ORAL | Status: DC
  Administered 2023-12-12 – 2023-12-16 (×6): 300 mg via ORAL
  Filled 2023-12-12 (×5): qty 1
  Filled 2023-12-12: qty 3

## 2023-12-12 MED ORDER — SUMATRIPTAN SUCCINATE 25 MG PO TABS: 25.0000 mg | ORAL_TABLET | ORAL | Status: DC | PRN

## 2023-12-12 MED ORDER — FINASTERIDE 5 MG PO TABS
5.0000 mg | ORAL_TABLET | Freq: Every day | ORAL | Status: DC
  Administered 2023-12-12 – 2023-12-16 (×6): 5 mg via ORAL
  Filled 2023-12-12 (×7): qty 1

## 2023-12-12 MED ORDER — AMIODARONE HCL 200 MG PO TABS
200.0000 mg | ORAL_TABLET | Freq: Every day | ORAL | Status: DC
  Administered 2023-12-12 – 2023-12-17 (×6): 200 mg via ORAL
  Filled 2023-12-12 (×6): qty 1

## 2023-12-12 MED ORDER — DIPHENHYDRAMINE HCL 25 MG PO CAPS
25.0000 mg | ORAL_CAPSULE | Freq: Every day | ORAL | Status: DC
  Administered 2023-12-12 – 2023-12-13 (×3): 25 mg via ORAL
  Filled 2023-12-12 (×3): qty 1

## 2023-12-12 MED ORDER — ATORVASTATIN CALCIUM 40 MG PO TABS
80.0000 mg | ORAL_TABLET | Freq: Every day | ORAL | Status: DC
  Administered 2023-12-12 – 2023-12-17 (×6): 80 mg via ORAL
  Filled 2023-12-12 (×6): qty 2

## 2023-12-12 MED ORDER — GERHARDT'S BUTT CREAM
TOPICAL_CREAM | Freq: Two times a day (BID) | CUTANEOUS | Status: DC
  Filled 2023-12-12: qty 60

## 2023-12-12 NOTE — Progress Notes (Signed)
*  PRELIMINARY RESULTS* Echocardiogram 2D Echocardiogram has been performed.  Tanner [name] Stallion 12/12/2023, 9:56 AM

## 2023-12-12 NOTE — Consult Note (Signed)
 WOC Nurse Consult Note: Reason for Consult: stage 3 pressure injury History DM, HTN, CHF, WC bound Wound type: Unstageable Pressure Injury: right buttock Unstageable Pressure Injury: left buttock ICD; sacrum; incontinence related  Pressure Injury POA: Yes/ Measurement: see nursing flow sheets Wound bed: both are covered with yellow 100%  Drainage (amount, consistency, odor) see nursing flow sheets Periwound: irritant contact dermatitis  Dressing procedure/placement/frequency: Cleanse wounds on the buttocks with saline, pat dry Apply Santyl to gauze pads and place over wounds, top with ABD pads, secure with tape of mesh underwear.  Low air loss mattress for moisture management and pressure redistribution Gerhardt's to ICD Turn and reposition per hospital policy Consult RD for wound supplementation PT to consult for [facility] cushion/pressure relief   Re consult if needed, will not follow at this time. Thanks  Alisa Stjames [employer], RN,CWOCN, CNS, CWON-AP [phone])

## 2023-12-12 NOTE — Progress Notes (Addendum)
 PROGRESS NOTE  Tanner [name]  DOB: 1946-05-28  PCP: [facility] FMW:[id number]  DOA: 12/11/2023  LOS: 1 day  Hospital Day: 2  Brief narrative: Tanner [name] is a 78 y.o. male with PMH significant for DM2, HTN, HLD, obesity, OSA, CHF, DVT/PE, A-fib on Eliquis , chronic diastolic CHF, neuropathy, anxiety/depression, who is wheelchair-bound, and has a stage III pressure ulcers. 6/28, patient presented to the ED for evaluation of worsening bilateral lower EXTR swelling, dyspnea on exertion and also worsening buttocks wound. Lives at home alone, wheelchair-bound.  Patient reports that he developed a small pressure ulcer on his buttocks during a hospitalization last December. He is wheelchair-bound and has gradually worsening to stage III pressure ulcer.  He was seen by the PCP 3 weeks ago and prescribed 10-day course of Augmentin .  The day before presentation, he noticed some blood from the wound after wiping.    In the ED, patient is afebrile, blood pressure in 160s, breathing on room air at rest Labs with WC count 9.2, hemoglobin 11, BNP 428, troponin normal, CRP normal, BUN/creatinine 23/1.63 Chest x-ray without any acute cardiopulmonary disease Patient was started on IV Lasix , IV Rocephin , Norco Admitted to [facility]   Subjective: Patient was seen and examined this morning. Propped up in bed.  Not in distress. Has chronic stasis changes in both legs but patient states both legs are swollen more than normal.  Reports compliance to Eliquis  He is afraid that the pressure ulcer on his buttocks is getting worse. Chart reviewed. Remains afebrile, blood pressure in 160s Most recent labs from this morning with WBC count normal, hemoglobin 10.5, BUN/creatinine 25/1.66  Assessment and plan: Acute on chronic diastolic heart failure HTN Presented with dyspnea on exertion and bilateral lower extremity swelling  last TTE on October 2024 showed EF 60-65% Has been started on IV Lasix  40  mg twice daily Pending repeat echocardiogram PTA meds- Coreg  6.25 mg twice daily, torsemide  20 mg daily, Jardiance  5 mg daily Continued on Coreg  and Jardiance .  Diuretics as above. Continue to monitor for daily intake output, weight, blood pressure, BNP, renal function and electrolytes. Net IO Since Admission: -1,785 mL [12/12/23 1029] Recent Labs  Lab 12/11/23 1654 12/11/23 1716 12/12/23 0440  BNP  --  427.7*  --   BUN 23  --  25*  CREATININE 1.63*  --  1.66*  NA 138  --  138  K 4.6  --  4.3  MG  --   --  2.0    Stage III pressure ulcer Currently on 10-day course of Augmentin  with end date of 6/30 On exam, there is mild cellulitis superimposed on his pressure ulcer.  Picture attached as below Started on IV Rocephin  No fever, WBC count normal Continue to monitor Wound care consulted, appreciate recs Recent Labs  Lab 12/11/23 1654 12/12/23 0440  WBC 9.2 9.4   Chronic bilateral lower extremity edema Chronic lower extremity cellulitis Although he claims compliance to Eliquis , I am worried about risk of recurrence of DVT.  Will obtain ultrasound duplex.  A-fib EKG on admission shows sinus rhythm with first-degree AV block with RBBB and LAFB HR stable in the 60s to 70s Continue Coreg , amiodarone  and Eliquis  Trend and replete electrolytes Telemetry   PE/DVT Continue Eliquis    HLD Continue atorvastatin    Anxiety and depression Continue bupropion  and nortriptyline    BPH Continue finasteride    GERD Continue Protonix    Migraine headache Reported mild left-sided headache on admission Continue as needed sumatriptan peripheral  neuropathy  Peripheral neuropathy Continue gabapentin    Mobility: Wheelchair-bound..  May need placement.  PT eval, OT eval ordered.  Goals of care   Code Status: Full Code    DVT prophylaxis:  Place TED hose Start: 12/12/23 0046 apixaban  (ELIQUIS ) tablet 5 mg   Antimicrobials: IV Rocephin  Fluid: None Consultants: None Family  Communication: None at bedside  Status: Inpatient Level of care:  Telemetry   Patient is from: Home Needs to continue in-hospital care: Needs IV antibiotics, further workup Anticipated d/c to: Home versus placement.  Pending PT eval      Diet:  Diet Order             Diet Heart Room service appropriate? Yes; Fluid consistency: Thin  Diet effective now                   Scheduled Meds:  amiodarone   200 mg Oral Daily   apixaban   5 mg Oral BID   atorvastatin   80 mg Oral Daily   buPROPion   150 mg Oral BID   carvedilol   6.25 mg Oral BID WC   diphenhydrAMINE   25 mg Oral QHS   finasteride   5 mg Oral QHS   furosemide   40 mg Intravenous BID   gabapentin   300 mg Oral QHS   gabapentin   600 mg Oral TID AC   loratadine   10 mg Oral Daily   multivitamin with minerals  1 tablet Oral QPM   nortriptyline   20 mg Oral QHS   pantoprazole   40 mg Oral Q1200   potassium chloride   10 mEq Oral QPM    PRN meds: acetaminophen  **OR** acetaminophen , ondansetron  **OR** ondansetron  (ZOFRAN ) IV, senna-docusate, SUMAtriptan   Infusions:   cefTRIAXone  (ROCEPHIN )  IV      Antimicrobials: Anti-infectives (From admission, onward)    Start     Dose/Rate Route Frequency Ordered Stop   12/12/23 1800  cefTRIAXone  (ROCEPHIN ) 1 g in sodium chloride  0.9 % 100 mL IVPB        1 g 200 mL/hr over 30 Minutes Intravenous Every 24 hours 12/12/23 0044     12/11/23 1800  cefTRIAXone  (ROCEPHIN ) 2 g in sodium chloride  0.9 % 100 mL IVPB        2 g 200 mL/hr over 30 Minutes Intravenous  Once 12/11/23 1754 12/11/23 2051       Objective: Vitals:   12/12/23 0229 12/12/23 0617  BP: (!) 132/58 (!) 161/60  Pulse: 70 74  Resp: 19 18  Temp: (!) 97.2 F (36.2 C) 98 F (36.7 C)  SpO2: 95% 94%    Intake/Output Summary (Last 24 hours) at 12/12/2023 1029 Last data filed at 12/12/2023 9062 Gross per 24 hour  Intake 340 ml  Output 2125 ml  Net -1785 ml   Filed Weights   12/12/23 0659  Weight: 98.8 kg    Weight change:  Body mass index is 37.39 kg/m.   Physical Exam: General exam: Pleasant, elderly Caucasian male Skin: No rashes, lesions or ulcers. HEENT: Atraumatic, normocephalic, no obvious bleeding Lungs: Clear to auscultation bilaterally,  CVS: S1, S2, no murmur,   GI/Abd: Soft, nontender, nondistended, bowel sound present,   CNS: Alert, awake, oriented x 3 Psychiatry: Sad affect Extremities: 1+ bilateral pedal edema, chronic cellulitis changes   Data Review: I have personally reviewed the laboratory data and studies available.  F/u labs ordered Unresulted Labs (From admission, onward)     Start     Ordered   12/13/23 0500  Basic metabolic panel  with GFR  Tomorrow morning,   R        12/12/23 1016   12/13/23 0500  CBC with Differential/Platelet  Tomorrow morning,   R        12/12/23 1016            Signed, Chapman Rota, MD [facility] 12/12/2023

## 2023-12-13 ENCOUNTER — Inpatient Hospital Stay (HOSPITAL_COMMUNITY)

## 2023-12-13 ENCOUNTER — Other Ambulatory Visit (INDEPENDENT_AMBULATORY_CARE_PROVIDER_SITE_OTHER): Payer: Self-pay

## 2023-12-13 DIAGNOSIS — M7989 Other specified soft tissue disorders: Secondary | ICD-10-CM

## 2023-12-13 DIAGNOSIS — I5033 Acute on chronic diastolic (congestive) heart failure: Secondary | ICD-10-CM | POA: Diagnosis not present

## 2023-12-13 DIAGNOSIS — E782 Mixed hyperlipidemia: Secondary | ICD-10-CM

## 2023-12-13 DIAGNOSIS — Z7189 Other specified counseling: Secondary | ICD-10-CM

## 2023-12-13 DIAGNOSIS — I10 Essential (primary) hypertension: Secondary | ICD-10-CM

## 2023-12-13 DIAGNOSIS — F338 Other recurrent depressive disorders: Secondary | ICD-10-CM

## 2023-12-13 LAB — BASIC METABOLIC PANEL WITH GFR
Anion gap: 8 (ref 5–15)
BUN: 29 mg/dL — ABNORMAL HIGH (ref 8–23)
CO2: 26 mmol/L (ref 22–32)
Calcium: 8.2 mg/dL — ABNORMAL LOW (ref 8.9–10.3)
Chloride: 102 mmol/L (ref 98–111)
Creatinine, Ser: 1.89 mg/dL — ABNORMAL HIGH (ref 0.61–1.24)
GFR, Estimated: 36 mL/min — ABNORMAL LOW (ref >60)
Glucose, Bld: 85 mg/dL (ref 70–99)
Potassium: 3.7 mmol/L (ref 3.5–5.1)
Sodium: 136 mmol/L (ref 135–145)

## 2023-12-13 LAB — CBC WITH DIFFERENTIAL/PLATELET
Abs Immature Granulocytes: 0.06 10*3/uL (ref 0.00–0.07)
Basophils Absolute: 0.1 10*3/uL (ref 0.0–0.1)
Basophils Relative: 1 %
Eosinophils Absolute: 0.2 10*3/uL (ref 0.0–0.5)
Eosinophils Relative: 2 %
HCT: 36.2 % — ABNORMAL LOW (ref 39.0–52.0)
Hemoglobin: 10.1 g/dL — ABNORMAL LOW (ref 13.0–17.0)
Immature Granulocytes: 1 %
Lymphocytes Relative: 24 %
Lymphs Abs: 2.4 10*3/uL (ref 0.7–4.0)
MCH: 22 pg — ABNORMAL LOW (ref 26.0–34.0)
MCHC: 27.9 g/dL — ABNORMAL LOW (ref 30.0–36.0)
MCV: 78.9 fL — ABNORMAL LOW (ref 80.0–100.0)
Monocytes Absolute: 0.8 10*3/uL (ref 0.1–1.0)
Monocytes Relative: 8 %
Neutro Abs: 6.5 10*3/uL (ref 1.7–7.7)
Neutrophils Relative %: 64 %
Platelets: 223 10*3/uL (ref 150–400)
RBC: 4.59 MIL/uL (ref 4.22–5.81)
RDW: 25.9 % — ABNORMAL HIGH (ref 11.5–15.5)
WBC: 10 10*3/uL (ref 4.0–10.5)
nRBC: 0 % (ref 0.0–0.2)

## 2023-12-13 LAB — MRSA NEXT GEN BY PCR, NASAL: MRSA by PCR Next Gen: NOT DETECTED

## 2023-12-13 MED ORDER — ORAL CARE MOUTH RINSE: 15.0000 mL | OROMUCOSAL | Status: DC | PRN

## 2023-12-13 MED ORDER — COLLAGENASE 250 UNIT/GM EX OINT
TOPICAL_OINTMENT | Freq: Every day | CUTANEOUS | Status: DC
  Filled 2023-12-13: qty 30

## 2023-12-13 MED ORDER — JUVEN PO PACK
1.0000 | PACK | Freq: Two times a day (BID) | ORAL | Status: DC
  Administered 2023-12-13 – 2023-12-17 (×8): 1 via ORAL
  Filled 2023-12-13 (×8): qty 1

## 2023-12-13 MED ORDER — ZINC SULFATE 220 (50 ZN) MG PO CAPS
220.0000 mg | ORAL_CAPSULE | Freq: Every day | ORAL | Status: DC
  Administered 2023-12-13 – 2023-12-17 (×5): 220 mg via ORAL
  Filled 2023-12-13 (×5): qty 1

## 2023-12-13 MED ORDER — VITAMIN C 500 MG PO TABS
500.0000 mg | ORAL_TABLET | Freq: Two times a day (BID) | ORAL | Status: DC
  Administered 2023-12-13 – 2023-12-17 (×9): 500 mg via ORAL
  Filled 2023-12-13 (×9): qty 1

## 2023-12-13 NOTE — Progress Notes (Signed)
 PROGRESS NOTE  Tanner [name]  DOB: Apr 16, 1946  PCP: [facility] FMW:[id number]  DOA: 12/11/2023  LOS: 2 days  Hospital Day: 3  Brief narrative: Tanner [name] is a 78 y.o. male with PMH significant for DM2, HTN, HLD, obesity, OSA, CHF, DVT/PE, A-fib on Eliquis , chronic diastolic CHF, neuropathy, anxiety/depression, who is wheelchair-bound, and has a stage III pressure ulcers. 6/28, patient presented to the ED for evaluation of worsening bilateral lower EXTR swelling, dyspnea on exertion and also worsening buttocks wound. Lives at home alone, wheelchair-bound.  Patient reports that he developed a small pressure ulcer on his buttocks during a hospitalization last December. He is wheelchair-bound and has gradually worsening to stage III pressure ulcer.  He was seen by the PCP 3 weeks ago and prescribed 10-day course of Augmentin .  The day before presentation, he noticed some blood from the wound after wiping.    In the ED, patient is afebrile, blood pressure in 160s, breathing on room air at rest Labs with WC count 9.2, hemoglobin 11, BNP 428, troponin normal, CRP normal, BUN/creatinine 23/1.63 Chest x-ray without any acute cardiopulmonary disease Patient was started on IV Lasix , IV Rocephin , Norco Admitted to [facility]   Subjective: Patient was seen and examined this afternoon. Lying on bed.  Not in distress.  Not on supplemental oxygen . Creatinine worsened this morning and hence Lasix  held Pending PT eval  Assessment and plan: Acute on chronic diastolic heart failure HTN Presented with dyspnea on exertion and bilateral lower extremity swelling  last TTE on October 2024 showed EF 60-65% Was started on IV Lasix  40 mg twice daily Echocardiogram showed EF of 60 to 65%, no WMA, grade 1 diastolic dysfunction, RV size and function normal PTA meds- Coreg  6.25 mg twice daily, torsemide  20 mg daily, Jardiance  5 mg daily Continued on Coreg  and Jardiance .  Lasix  held because of  elevated creatinine today. Continue to monitor for daily intake output, weight, blood pressure, BNP, renal function and electrolytes. Net IO Since Admission: -5,125 mL [12/13/23 1420] Recent Labs  Lab 12/11/23 1654 12/11/23 1716 12/12/23 0440 12/13/23 0422  BNP  --  427.7*  --   --   BUN 23  --  25* 29*  CREATININE 1.63*  --  1.66* 1.89*  NA 138  --  138 136  K 4.6  --  4.3 3.7  MG  --   --  2.0  --     Stage III pressure ulcer Currently on 10-day course of Augmentin  with end date of 6/30 On exam, there is mild cellulitis superimposed on his pressure ulcer.  Picture attached as below Currently on IV Rocephin  No fever, WBC count normal Continue to monitor Wound care consult appreciated Recent Labs  Lab 12/11/23 1654 12/12/23 0440 12/13/23 0422  WBC 9.2 9.4 10.0   Chronic bilateral lower extremity edema Chronic lower extremity cellulitis Although he claims compliance to Eliquis , I am worried about risk of recurrence of DVT.  Pending ultrasound duplex.  A-fib EKG on admission shows sinus rhythm with first-degree AV block with RBBB and LAFB HR stable in the 60s to 70s Continue Coreg , amiodarone  and Eliquis  Trend and replete electrolytes Telemetry   PE/DVT Continue Eliquis    HLD Continue atorvastatin    Anxiety and depression Continue bupropion  and nortriptyline    BPH Continue finasteride    GERD Continue Protonix    Migraine headache Reported mild left-sided headache on admission Continue as needed sumatriptan peripheral neuropathy  Peripheral neuropathy Continue gabapentin    Mobility: Wheelchair-bound.SABRA  May need placement.  PT eval, OT eval pending  Goals of care   Code Status: Full Code    DVT prophylaxis:  Place TED hose Start: 12/12/23 0046 apixaban  (ELIQUIS ) tablet 5 mg   Antimicrobials: IV Rocephin  Fluid: None Consultants: None Family Communication: None at bedside  Status: Inpatient Level of care:  Telemetry   Patient is from:  Home Needs to continue in-hospital care: Needs IV antibiotics, further workup Anticipated d/c to: Home versus placement.  Pending PT eval      Diet:  Diet Order             Diet 2 gram sodium Room service appropriate? Yes; Fluid consistency: Thin  Diet effective now                   Scheduled Meds:  amiodarone   200 mg Oral Daily   apixaban   5 mg Oral BID   ascorbic acid  500 mg Oral BID   atorvastatin   80 mg Oral Daily   buPROPion   150 mg Oral BID   carvedilol   6.25 mg Oral BID WC   collagenase   Topical Daily   diphenhydrAMINE   25 mg Oral QHS   finasteride   5 mg Oral QHS   gabapentin   300 mg Oral QHS   gabapentin   600 mg Oral TID AC   Gerhardt's butt cream   Topical BID   loratadine   10 mg Oral Daily   multivitamin with minerals  1 tablet Oral QPM   nortriptyline   20 mg Oral QHS   nutrition supplement (JUVEN)  1 packet Oral BID BM   pantoprazole   40 mg Oral Q1200   potassium chloride   10 mEq Oral QPM   zinc sulfate (50mg  elemental zinc)  220 mg Oral Daily    PRN meds: acetaminophen  **OR** acetaminophen , ondansetron  **OR** ondansetron  (ZOFRAN ) IV, mouth rinse, senna-docusate, SUMAtriptan   Infusions:   cefTRIAXone  (ROCEPHIN )  IV 1 g (12/12/23 1827)    Antimicrobials: Anti-infectives (From admission, onward)    Start     Dose/Rate Route Frequency Ordered Stop   12/12/23 1800  cefTRIAXone  (ROCEPHIN ) 1 g in sodium chloride  0.9 % 100 mL IVPB        1 g 200 mL/hr over 30 Minutes Intravenous Every 24 hours 12/12/23 0044     12/11/23 1800  cefTRIAXone  (ROCEPHIN ) 2 g in sodium chloride  0.9 % 100 mL IVPB        2 g 200 mL/hr over 30 Minutes Intravenous  Once 12/11/23 1754 12/11/23 2051       Objective: Vitals:   12/13/23 0513 12/13/23 1212  BP: (!) 144/56 (!) 150/56  Pulse: 70 68  Resp: 16 16  Temp: 98.2 F (36.8 C) 98.4 F (36.9 C)  SpO2: 91% 90%    Intake/Output Summary (Last 24 hours) at 12/13/2023 1420 Last data filed at 12/13/2023 1300 Gross  per 24 hour  Intake 440 ml  Output 3200 ml  Net -2760 ml   Filed Weights   12/12/23 0659  Weight: 98.8 kg   Weight change:  Body mass index is 37.39 kg/m.   Physical Exam: General exam: Pleasant, elderly Caucasian male.  Not in distress Skin: No rashes, lesions or ulcers. HEENT: Atraumatic, normocephalic, no obvious bleeding Lungs: Clear to auscultation bilaterally,  CVS: S1, S2, no murmur,   GI/Abd: Soft, nontender, nondistended, bowel sound present,   CNS: Alert, awake, oriented x 3 Psychiatry: Sad affect Extremities: Improving bilateral pedal edema, chronic stasis changes   Data  Review: I have personally reviewed the laboratory data and studies available.  F/u labs ordered Unresulted Labs (From admission, onward)     Start     Ordered   12/13/23 1045  MRSA Next Gen by PCR, Nasal  Once,   R        12/13/23 1045   12/13/23 1018  Vitamin A  Once,   R        12/13/23 1018            Signed, Chapman Rota, MD [facility] 12/13/2023

## 2023-12-13 NOTE — Progress Notes (Signed)
 BLE venous duplex has been completed.   Results can be found under chart review under CV PROC. 12/13/2023 2:43 PM Crisanto Nied RVT, RDMS

## 2023-12-13 NOTE — Evaluation (Signed)
 Physical Therapy Evaluation Patient Details Name: Tanner [name] MRN: [id number] DOB: 1945/07/02 Today's Date: 12/13/2023  History of Present Illness  Pt is 78 yo male admitted on 12/11/23 with acute on chronic heart failure as well as stage III pressure ulcer on sacrum. Pt with hx including but not limited to  DM2, HTN, HLD, obesity, OSA, CHF, DVT/PE, A-fib on Eliquis , chronic diastolic CHF, neuropathy, anxiety/depression, Bil TKA with R side requiring I and D 10/24.  Clinical Impression  Pt admitted with above diagnosis. At baseline, pt lives alone.  He reports was at [facility] and was walking some then but has mostly been in w/c since return home.  Pt would like to progress to some walking but does report weakness in R knee that limits.  Pt lives alone with intermittent support.  Today, pt agreeable to PT and reports feels weaker than baseline.  He required min A and use of rails for transfers.  For OOB, had +2 for safety and required min A to squat pivot.  If progressing to ambulation would need +2 for chair follow due to R knee instability/weakness.  Pt is below his baseline.  Pt currently with functional limitations due to the deficits listed below (see PT Problem List). Pt will benefit from acute skilled PT to increase their independence and safety with mobility to allow discharge.  Patient will benefit from continued inpatient follow up therapy, <3 hours/day at d/c ; however, due to chronic conditions - may need to consider more long term options in near future (assist at home vs ALF)         If plan is discharge home, recommend the following: A little help with walking and/or transfers;A little help with bathing/dressing/bathroom;Assistance with cooking/housework;Help with stairs or ramp for entrance   Can travel by private vehicle   No    Equipment Recommendations None recommended by PT  Recommendations for Other Services       Functional Status Assessment Patient has had a  recent decline in their functional status and demonstrates the ability to make significant improvements in function in a reasonable and predictable amount of time.     Precautions / Restrictions Precautions Precautions: Fall      Mobility  Bed Mobility Overal bed mobility: Needs Assistance Bed Mobility: Supine to Sit, Sit to Supine     Supine to sit: Min assist, Used rails, HOB elevated Sit to supine: Min assist, Used rails, HOB elevated        Transfers Overall transfer level: Needs assistance Equipment used: Rolling walker (2 wheels), None Transfers: Sit to/from Stand, Bed to chair/wheelchair/BSC Sit to Stand: Min assist, +2 safety/equipment     Squat pivot transfers: Min assist, +2 safety/equipment     General transfer comment: Pt performed STS x 1 from bed with RW and min A of 2.  He also performed squat pivot to and from chair with min A of 2 - pt uses armrest of chair or bed to assist with transfers    Ambulation/Gait               General Gait Details: Pt reports only taking a couple of steps since returning home in January due to R knee weakness.  He would like to get back to walking.  Stairs            Wheelchair Mobility     Tilt Bed    Modified Rankin (Stroke Patients Only)       Balance Overall balance assessment:  Needs assistance Sitting-balance support: Bilateral upper extremity supported, No upper extremity supported Sitting balance-Leahy Scale: Fair Sitting balance - Comments: Prefers UE support; could static sit without support; is on air mattress   Standing balance support: Bilateral upper extremity supported Standing balance-Leahy Scale: Poor                               Pertinent Vitals/Pain Pain Assessment Pain Assessment: 0-10 Pain Score: 6  Pain Location: buttock - wound Pain Descriptors / Indicators: Discomfort, Sore Pain Intervention(s): Limited activity within patient's tolerance, Monitored during  session, Premedicated before session, Repositioned    Home Living Family/patient expects to be discharged to:: Private residence Living Arrangements: Alone Available Help at Discharge: Family;Available PRN/intermittently Type of Home: House Home Access: Ramped entrance       Home Layout: One level Home Equipment: Agricultural consultant (2 wheels);Cane - single point;Grab bars - tub/shower;Wheelchair - manual;Tub bench Additional Comments: Reports supposed to be getting electric chair but has not yet; Has life alert button    Prior Function Prior Level of Function : Needs assist             Mobility Comments: Mainly uses w/c, can transfer independently; reports limited steps since January - states was walking some in rehab back in january ADLs Comments: reports independent with ADLs but getting more difficulty; has assist for IADLs     Extremity/Trunk Assessment   Upper Extremity Assessment Upper Extremity Assessment: RUE deficits/detail;LUE deficits/detail RUE Deficits / Details: ROM WFL; MMT 4/5 LUE Deficits / Details: ROM WFL; MMT 4/5    Lower Extremity Assessment Lower Extremity Assessment: LLE deficits/detail;RLE deficits/detail RLE Deficits / Details: Knee lacking ~10 extension; MMT : grossly 4/5 throghout LLE Deficits / Details: Knee lacking ~10 extension; MMT : grossly 4/5 throghout    Cervical / Trunk Assessment Cervical / Trunk Assessment: Kyphotic;Other exceptions Cervical / Trunk Exceptions: Hx of Bell's Palsy with R facial droop and slurred speech  Communication   Communication Factors Affecting Communication: Reduced clarity of speech;Other (comment) (slurred speech - baseline, hx of Bell's Palsy)    Cognition Arousal: Alert Behavior During Therapy: WFL for tasks assessed/performed   PT - Cognitive impairments: No apparent impairments                         Following commands: Intact       Cueing       General Comments General comments  (skin integrity, edema, etc.): VSS; admitted with stage III on sacrum    Exercises     Assessment/Plan    PT Assessment Patient needs continued PT services  PT Problem List Decreased strength;Pain;Decreased range of motion;Decreased activity tolerance;Decreased knowledge of use of DME;Decreased balance;Decreased mobility       PT Treatment Interventions DME instruction;Therapeutic exercise;Gait training;Functional mobility training;Therapeutic activities;Patient/family education;Balance training;Wheelchair mobility training    PT Goals (Current goals can be found in the Care Plan section)  Acute Rehab PT Goals Patient Stated Goal: Get stronger, be able to walk again some PT Goal Formulation: With patient Time For Goal Achievement: 12/27/23 Potential to Achieve Goals: Fair    Frequency Min 1X/week     Co-evaluation               AM-PAC PT 6 Clicks Mobility  Outcome Measure Help needed turning from your back to your side while in a flat bed without using bedrails?: A Lot Help needed  moving from lying on your back to sitting on the side of a flat bed without using bedrails?: A Lot Help needed moving to and from a bed to a chair (including a wheelchair)?: A Lot Help needed standing up from a chair using your arms (e.g., wheelchair or bedside chair)?: A Lot Help needed to walk in hospital room?: Total Help needed climbing 3-5 steps with a railing? : Total 6 Click Score: 10    End of Session Equipment Utilized During Treatment: Gait belt Activity Tolerance: Patient tolerated treatment well Patient left: in bed;with call bell/phone within reach Nurse Communication: Mobility status PT Visit Diagnosis: Other abnormalities of gait and mobility (R26.89);Muscle weakness (generalized) (M62.81)    Time: 8545-8475 PT Time Calculation (min) (ACUTE ONLY): 30 min   Charges:   PT Evaluation $PT Eval Low Complexity: 1 Low PT Treatments $Therapeutic Activity: 8-22 mins PT  General Charges $$ ACUTE PT VISIT: 1 Visit         Benjiman, PT Acute Rehab [facility] [phone]   Benjiman VEAR [place] 12/13/2023, 4:14 PM

## 2023-12-13 NOTE — Progress Notes (Signed)
 Patient found to be 82% on RA on spot check. Patient denies SOB, states he has worn 3L O2 in the past but has not needed it in 2-3 months. 2L [place] applied - sats increased to 94%. MD made aware, no new orders, will continue [place] at this time.

## 2023-12-13 NOTE — Progress Notes (Signed)
 Initial Nutrition Assessment  DOCUMENTATION CODES:   Obesity unspecified  INTERVENTION:   -Liberalize diet to 2 gram sodium for wider variety of meal selections -MVI with minerals daily -500 mg vitamin C BID -220 mg zinc sulfate daily x 14 days -Magic cup BID with meals, each supplement provides 290 kcal and 9 grams of protein  -1 packet Juven BID, each packet provides 95 calories, 2.5 grams of protein (collagen), and 9.8 grams of carbohydrate (3 grams sugar); also contains 7 grams of L-arginine and L-glutamine, 300 mg vitamin C, 15 mg vitamin E, 1.2 mcg vitamin B-12, 9.5 mg zinc, 200 mg calcium , and 1.5 g  Calcium  Beta-hydroxy-Beta-methylbutyrate to support wound healing  -Check and replete for micronutrient deficiencies which may impede wound healing (vitamin A)   NUTRITION DIAGNOSIS:   Increased nutrient needs related to wound healing as evidenced by estimated needs.  GOAL:   Patient will meet greater than or equal to 90% of their needs  MONITOR:   PO intake, Supplement acceptance  REASON FOR ASSESSMENT:   Consult Wound healing  ASSESSMENT:   with medical history significant for A-fib on Eliquis , PE/DVT, chronic diastolic heart failure, HTN, HLD, anxiety and depression, T2DM, C. difficile colitis, CKD 3B, morbid obesity, BPH, wheelchair-bound and stage III pressure ulcer who presents to the ER for evaluation of worsening LE swelling, wound check and dyspnea on exertion.  Pt admitted with CHF and cellulitis.   Reviewed I/O's: -3 L x 24 hours and -5.1 L since admission  UOP: 3.6 L x 24 hours  Pt unavailable at time of visit. Attempted to speak with pt via call to hospital room phone, however, unable to reach. RD unable to obtain further nutrition-related history or complete nutrition-focused physical exam at this time.    Per [facility] notes, pt with ICD to sacrum, and unstainable pressure injuries to rt and lt buttocks. Per H&P, wound have been present since December 2024.  Pt is wheelchair bound at baseline.   Pt currently on a heart healthy diet. Noted meal completions 100%. Case discussed with RN, who reports intake has improved since admission and he consumed 100% of his breakfast.   Reviewed wt hx; pt has experienced a 6.1% wt loss over the past 7 months, which is not significant for time frame. Per nursing assessment, pt with moderate edema, which may be masking true weight loss as well as fat and muscle depletions.   Pt with increased nutritional needs for wound healing and would greatly benefit from addition of oral nutrition supplements.   Medications reviewed and include neurontin .   Labs reviewed: CBGS: 125 (inpatient orders for glycemic control are none).    Diet Order:   Diet Order             Diet 2 gram sodium Room service appropriate? Yes; Fluid consistency: Thin  Diet effective now                   EDUCATION NEEDS:   No education needs have been identified at this time  Skin:  Skin Assessment: Skin Integrity Issues: Skin Integrity Issues:: Other (Comment), Unstageable Unstageable: rt and lt buttocks Other: IAD sacrum  Last BM:  12/10/23  Height:   Ht Readings from Last 1 Encounters:  12/12/23 5' 4 (1.626 m)    Weight:   Wt Readings from Last 1 Encounters:  12/12/23 98.8 kg    Ideal Body Weight:  59.1 kg  BMI:  Body mass index is 37.39 kg/m.  Estimated Nutritional  Needs:   Kcal:  1750-1950  Protein:  90-105 grams  Fluid:  1.7-1.9 L    Margery ORN, RD, LDN, CDCES Registered Dietitian III Certified Diabetes Care and Education Specialist If unable to reach this RD, please use RD Inpatient group chat on secure chat between hours of 8am-4 pm daily

## 2023-12-13 NOTE — Progress Notes (Signed)
 Dr. Lauraine Plants, DO  ,    This patient has met the requirements to bill for Chronic Care Management services this month. Please sign this encounter as you typically would any other encounter. There is NO SPECIAL ATTESTATION needed . COSIGN ONLY. Please let us  know if you have any questions at all.    Chronic Care Management Time Documentation on 12/13/2023 14:43.   Time spent during current encounter is 1 minutes.   Cumulative time during current month's episode (month-to-date) is 40 minutes.          ICD-10-CM    1. Encounter for counseling for care management of patient with chronic conditions and complex health needs using nurse-based model  Z71.89       2. Essential hypertension  I10       3. Seasonal depression (CMS HCC)  F33.8       4. Mixed hyperlipidemia  E78.2             acetaminophen (TYLENOL) 500 mg Oral Tablet, Take 1 Tablet (500 mg total) by mouth Every 4 hours as needed for Pain (pt takes tylenol pm)  alfuzosin  (UROXATRAL ) 10 mg Oral Tablet Sustained Release 24 hr, Take 1 Tablet (10 mg total) by mouth Once a day  diltiazem  HCl (TIAZAC ) 240 mg Oral Capsule,Sustained Action 24 hr, Take 1 Capsule (240 mg total) by mouth Daily  ELIQUIS  5 mg Oral Tablet, TAKE 1 TABLET TWICE DAILY  finasteride  (PROSCAR ) 5 mg Oral Tablet, Take 1 Tablet (5 mg total) by mouth Once a day  flecainide  (TAMBOCOR ) 100 mg Oral Tablet, TAKE 1 TABLET TWICE DAILY  furosemide  (LASIX ) 40 mg Oral Tablet, TAKE 1 TABLET ONE TIME DAILY  hydrALAZINE  (APRESOLINE ) 10 mg Oral Tablet, Take 1 Tablet (10 mg total) by mouth Three times a day  nitroGLYCERIN  (NITROSTAT ) 0.4 mg Sublingual Tablet, Sublingual, Place 1 Tablet (0.4 mg total) under the tongue Every 5 minutes as needed for Chest pain  nystatin  (MYCOSTATIN ) 100,000 unit/mL Oral Suspension, TAKE 5 MLS BY MOUTH FOUR TIMES A DAY AS NEEDED FOR THRUSH  potassium chloride  (K-DUR) 10 mEq Oral Tab Sust.Rel. Particle/Crystal, TAKE 1 TABLET ONE TIME DAILY  rosuvastatin  (CRESTOR ) 10 mg Oral Tablet, Take  1 Tablet (10 mg total) by mouth Every evening  SPIRIVA  WITH HANDIHALER 18 mcg Inhalation Capsule, w/Inhalation Device, Take 1 Capsule (18 mcg total) by inhalation Once a day    No facility-administered medications prior to visit.        Thanks,  Your Chronic Care Management Team

## 2023-12-14 DIAGNOSIS — I5033 Acute on chronic diastolic (congestive) heart failure: Secondary | ICD-10-CM | POA: Diagnosis not present

## 2023-12-14 MED ORDER — MELATONIN 5 MG PO TABS
5.0000 mg | ORAL_TABLET | Freq: Every day | ORAL | Status: DC
  Administered 2023-12-14 – 2023-12-16 (×3): 5 mg via ORAL
  Filled 2023-12-14 (×3): qty 1

## 2023-12-14 NOTE — Progress Notes (Signed)
 PROGRESS NOTE  Tanner [name]  DOB: May 26, 1946  PCP: [facility] FMW:[id number]  DOA: 12/11/2023  LOS: 3 days  Hospital Day: 4  Brief narrative: Tanner [name] is a 78 y.o. male with PMH significant for DM2, HTN, HLD, obesity, OSA, CHF, DVT/PE, A-fib on Eliquis , chronic diastolic CHF, neuropathy, anxiety/depression, who is wheelchair-bound, and has a stage III pressure ulcers. 6/28, patient presented to the ED for evaluation of worsening bilateral lower EXTR swelling, dyspnea on exertion and also worsening buttocks wound. Lives at home alone, wheelchair-bound.  Patient reports that he developed a small pressure ulcer on his buttocks during a hospitalization last December. He is wheelchair-bound and has gradually worsening to stage III pressure ulcer.  He was seen by the PCP 3 weeks ago and prescribed 10-day course of Augmentin .  The day before presentation, he noticed some blood from the wound after wiping.    In the ED, patient is afebrile, blood pressure in 160s, breathing on room air at rest Labs with WC count 9.2, hemoglobin 11, BNP 428, troponin normal, CRP normal, BUN/creatinine 23/1.63 Chest x-ray without any acute cardiopulmonary disease Patient was started on IV Lasix , IV Rocephin , Norco Admitted to [facility]   Subjective: Patient was seen and examined this morning. Somnolent at the time of my eval.  Per RN, earlier he was restless and trying to pull out lines. Vital signs stable.  On 2 L oxygen . Seen by PT SNF recommended  Assessment and plan: Acute on chronic diastolic heart failure HTN Presented with dyspnea on exertion and bilateral lower extremity swelling  last TTE on October 2024 showed EF 60-65% Was started on IV Lasix  40 mg twice daily Echocardiogram showed EF of 60 to 65%, no WMA, grade 1 diastolic dysfunction, RV size and function normal PTA meds- Coreg  6.25 mg twice daily, torsemide  20 mg daily, Jardiance  5 mg daily Continued on Coreg  and Jardiance .   Lasix  held because of elevated creatinine.  To repeat labs tomorrow. Continue to monitor for daily intake output, weight, blood pressure, BNP, renal function and electrolytes. Net IO Since Admission: -6,025 mL [12/14/23 1359] Recent Labs  Lab 12/11/23 1654 12/11/23 1716 12/12/23 0440 12/13/23 0422  BNP  --  427.7*  --   --   BUN 23  --  25* 29*  CREATININE 1.63*  --  1.66* 1.89*  NA 138  --  138 136  K 4.6  --  4.3 3.7  MG  --   --  2.0  --     Stage III pressure ulcer Currently on 10-day course of Augmentin  with end date of 6/30 On exam, there is mild cellulitis superimposed on his pressure ulcer.  Picture attached as below Currently on IV Rocephin . No fever, WBC count normal Continue to monitor Wound care consult appreciated Recent Labs  Lab 12/11/23 1654 12/12/23 0440 12/13/23 0422  WBC 9.2 9.4 10.0   Acute medical encephalopathy Somewhat confused and restless this morning. Somnolent at the time of my eval.  No focal deficit.   Probably going into hospital induced delirium. Hold of any sedatives.  Melatonin scheduled nightly to improve circadian rhythm  Chronic bilateral lower extremity edema Chronic lower extremity cellulitis H/o DVT/PE Ultrasound duplex negative for DVT. Chronically anticoagulated on Eliquis .  Seems compliant Ace wraps applied in both legs  A-fib EKG on admission shows sinus rhythm with first-degree AV block with RBBB and LAFB HR stable in the 60s to 70s Continue Coreg , amiodarone  and Eliquis  To monitor in telemetry  HLD Continue atorvastatin    Anxiety and depression Continue bupropion  and nortriptyline    BPH Continue finasteride    GERD Continue Protonix    Migraine headache Reported mild left-sided headache on admission Continue as needed sumatriptan peripheral neuropathy  Peripheral neuropathy Continue gabapentin   Impaired mobility Mostly in wheelchair. PT recommended SNF  Goals of care   Code Status: Full Code    DVT  prophylaxis:  Place TED hose Start: 12/12/23 0046 apixaban  (ELIQUIS ) tablet 5 mg   Antimicrobials: IV Rocephin  Fluid: None Consultants: None Family Communication: None at bedside  Status: Inpatient Level of care:  Telemetry   Patient is from: Home Needs to continue in-hospital care: Needs IV antibiotics, further workup Anticipated d/c to: Home versus placement.  Pending PT eval      Diet:  Diet Order             Diet 2 gram sodium Room service appropriate? Yes; Fluid consistency: Thin  Diet effective now                   Scheduled Meds:  amiodarone   200 mg Oral Daily   apixaban   5 mg Oral BID   ascorbic acid  500 mg Oral BID   atorvastatin   80 mg Oral Daily   buPROPion   150 mg Oral BID   carvedilol   6.25 mg Oral BID WC   collagenase   Topical Daily   finasteride   5 mg Oral QHS   gabapentin   300 mg Oral QHS   gabapentin   600 mg Oral TID AC   Gerhardt's butt cream   Topical BID   loratadine   10 mg Oral Daily   melatonin  5 mg Oral QHS   multivitamin with minerals  1 tablet Oral QPM   nortriptyline   20 mg Oral QHS   nutrition supplement (JUVEN)  1 packet Oral BID BM   pantoprazole   40 mg Oral Q1200   potassium chloride   10 mEq Oral QPM   zinc sulfate (50mg  elemental zinc)  220 mg Oral Daily    PRN meds: acetaminophen  **OR** acetaminophen , ondansetron  **OR** ondansetron  (ZOFRAN ) IV, mouth rinse, senna-docusate, SUMAtriptan   Infusions:   cefTRIAXone  (ROCEPHIN )  IV 1 g (12/13/23 1625)    Antimicrobials: Anti-infectives (From admission, onward)    Start     Dose/Rate Route Frequency Ordered Stop   12/12/23 1800  cefTRIAXone  (ROCEPHIN ) 1 g in sodium chloride  0.9 % 100 mL IVPB        1 g 200 mL/hr over 30 Minutes Intravenous Every 24 hours 12/12/23 0044     12/11/23 1800  cefTRIAXone  (ROCEPHIN ) 2 g in sodium chloride  0.9 % 100 mL IVPB        2 g 200 mL/hr over 30 Minutes Intravenous  Once 12/11/23 1754 12/11/23 2051       Objective: Vitals:    12/14/23 0323 12/14/23 1009  BP: (!) 119/47 (!) 117/45  Pulse: 75 (!) 57  Resp: 14 16  Temp: 98.8 F (37.1 C) (!) 97.5 F (36.4 C)  SpO2: 93% 99%    Intake/Output Summary (Last 24 hours) at 12/14/2023 1359 Last data filed at 12/14/2023 0651 Gross per 24 hour  Intake 150 ml  Output 1050 ml  Net -900 ml   Filed Weights   12/12/23 0659  Weight: 98.8 kg   Weight change:  Body mass index is 37.39 kg/m.   Physical Exam: General exam: Pleasant, elderly Caucasian male.  Not in distress Skin: No rashes, lesions or ulcers. HEENT: Atraumatic, normocephalic,  no obvious bleeding Lungs: Clear to auscultation bilaterally,  CVS: S1, S2, no murmur,   GI/Abd: Soft, nontender, nondistended, bowel sound present,   CNS: Somnolent.  Opens eyes on command.  Oriented to place only Psychiatry: Sad affect Extremities: Improving bilateral pedal edema, chronic stasis changes.  Ace wrap on both legs   Data Review: I have personally reviewed the laboratory data and studies available.  F/u labs ordered Unresulted Labs (From admission, onward)     Start     Ordered   12/13/23 1018  Vitamin A  Once,   R        12/13/23 1018            Signed, Chapman Rota, MD [facility] 12/14/2023

## 2023-12-14 NOTE — TOC Initial Note (Signed)
 Transition of Care [facility]) - Initial/Assessment Note    Patient Details  Name: Tanner [name] MRN: [id number] Date of Birth: 02/01/1946  [facility] CM/SW Contact:    [name], LCSW Phone Number: 12/14/2023, 4:39 PM  Clinical Narrative:                  CSW spoke with the pt regarding recommendations for SNF placement. The pt is currently coming from home and reports being wheelchair-bound. He shared that he was walking to some extent when discharged from his previous SNF. The pt is agreeable to SNF placement and would like to use his [employer]. CSW explained the process, noting that insurance authorization will be required. The pt requested that CSW provide updates to his sons. CSW spoke with pt's son [name] and provided update . TOC to follow.   Expected Discharge Plan: Skilled Nursing Facility Barriers to Discharge: Continued Medical Work up   Patient Goals and CMS Choice Patient states their goals for this hospitalization and ongoing recovery are:: SNf to gt stonger CMS Medicare.gov Compare Post Acute Care list provided to:: Patient Choice offered to / list presented to : Patient      Expected Discharge Plan and Services       Living arrangements for the past 2 months: Single Family Home                                      Prior Living Arrangements/Services Living arrangements for the past 2 months: Single Family Home Lives with:: Self Patient language and need for interpreter reviewed:: Yes Do you feel safe going back to the place where you live?: Yes      Need for Family Participation in Patient Care: No (Comment) Care giver support system in place?: No (comment) Current home services: DME Criminal Activity/Legal Involvement Pertinent to Current Situation/Hospitalization: No - Comment as needed  Activities of Daily Living   ADL Screening (condition at time of admission) Independently performs ADLs?: No Does the  patient have a NEW difficulty with bathing/dressing/toileting/self-feeding that is expected to last >3 days?: No Does the patient have a NEW difficulty with getting in/out of bed, walking, or climbing stairs that is expected to last >3 days?: No Does the patient have a NEW difficulty with communication that is expected to last >3 days?: No Is the patient deaf or have difficulty hearing?: No Does the patient have difficulty seeing, even when wearing glasses/contacts?: Yes Does the patient have difficulty concentrating, remembering, or making decisions?: No  Permission Sought/Granted                  Emotional Assessment Appearance:: Appears stated age Attitude/Demeanor/Rapport: Gracious Affect (typically observed): Accepting Orientation: : Oriented to Self, Oriented to Place, Oriented to  Time, Oriented to Situation   Psych Involvement: No (comment)  Admission diagnosis:  Acute on chronic diastolic (congestive) heart failure (HCC) [I50.33] Patient Active Problem List   Diagnosis Date Noted   Cellulitis of buttock 12/12/2023   Pressure injury of buttock, stage 3 (HCC) 12/12/2023   Acute on chronic diastolic (congestive) heart failure (HCC) 12/11/2023   Cellulitis and abscess of right leg 09/13/2023   Renal mass 05/05/2023   History of MRSA infection 04/24/2023   Lung nodule 04/24/2023   Generalized weakness 04/07/2023   Hyperlipidemia 04/07/2023   MRSA bacteremia 04/07/2023   Streptococcal bacteremia 04/07/2023   Infection  of prosthetic right knee joint (HCC) 04/06/2023   S/P total knee arthroplasty, right 12/15/2022   Paroxysmal atrial fibrillation (HCC) 08/19/2022   Jaw pain 06/24/2022   Left hand pain 05/29/2022   Chronic systolic CHF (congestive heart failure) (HCC) 02/23/2022   Moderate mitral regurgitation 02/23/2022   Leg swelling 01/29/2022   ED (erectile dysfunction) 01/13/2021   Cyst, dermoid, scalp and neck 01/13/2021   Chronic kidney disease (CKD), stage III  (moderate) (HCC) 12/13/2019   Carpal tunnel syndrome 12/05/2019   Aortic atherosclerosis (HCC) 11/15/2019   Peripheral neuropathy 03/17/2017   DM2 (diabetes mellitus, type 2) (HCC) 12/11/2016   Allergic rhinitis 12/11/2016   Degenerative arthritis of left knee 01/30/2016   Morbid obesity (HCC) 01/30/2016   Routine general medical examination at a health care facility 04/20/2014   History of pulmonary embolus (PE) 12/19/2013   Anxiety state 03/29/2007   Depression 03/29/2007   OSA (obstructive sleep apnea) 03/29/2007   Unspecified glaucoma 03/29/2007   BPH (benign prostatic hyperplasia) 03/29/2007   OA (osteoarthritis) of knee 03/29/2007   Essential hypertension 03/29/2007   History of Bell's palsy 03/29/2007   PCP:  [facility]:   [id number] [place], [place] - [address] AT [address] [place] [address] Phone: [phone] Fax: [phone]     Social Drivers of Health (SDOH) Social History: SDOH Screenings   Food Insecurity: No Food Insecurity (12/11/2023)  Housing: Low Risk  (12/11/2023)  Transportation Needs: No Transportation Needs (12/11/2023)  Utilities: Not At Risk (12/11/2023)  Alcohol Screen: Low Risk  (10/27/2022)  Depression (PHQ2-9): High Risk (09/13/2023)  Financial Resource Strain: Low Risk  (10/27/2022)  Physical Activity: Inactive (10/27/2022)  Social Connections: Moderately Integrated (12/11/2023)  Stress: No Stress Concern Present (12/31/2022)  Recent Concern: Stress - Stress Concern Present (10/30/2022)  Tobacco Use: Medium Risk (12/12/2023)   SDOH Interventions:     Readmission Risk Interventions    08/20/2022   11:10 AM  Readmission Risk Prevention Plan  Transportation Screening Complete  PCP or Specialist Appt within 3-5 Days Complete  HRI or Home Care Consult Complete  Palliative Care Screening Not Applicable  Medication Review (RN Care Manager) Complete

## 2023-12-14 NOTE — Evaluation (Signed)
 Occupational Therapy Evaluation Patient Details Name: Tanner [name] MRN: [id number] DOB: August 06, 1945 Today's Date: 12/14/2023   History of Present Illness   Pt is 78 yo male admitted on 12/11/23 with acute on chronic heart failure as well as stage III pressure ulcer on sacrum. Pt with hx including but not limited to  DM2, HTN, HLD, obesity, OSA, CHF, DVT/PE, A-fib on Eliquis , chronic diastolic CHF, neuropathy, anxiety/depression, Bil TKA with R side requiring I and D 10/24.     Clinical Impressions PTA, patient was living alone requiring increased assistance the past few months since January becoming more confined to w/c vs RW use and increased assist with A/IADL's.  Currently, patient presents with deficits outlined below (see OT Problem List for details) most significantly pain, decreased skin integrity, strength, balance and activity tolerance and higher level cog/processing skills limiting BADL's and functional mobility.  Patient requires continued Acute care hospital level OT services to progress safety and functional performance and allow for discharge. Patient will benefit from continued inpatient follow up therapy, <3 hours/day.        If plan is discharge home, recommend the following:   Two people to help with walking and/or transfers;A lot of help with bathing/dressing/bathroom;Assistance with cooking/housework;Direct supervision/assist for medications management;Direct supervision/assist for financial management;Assist for transportation;Help with stairs or ramp for entrance;Supervision due to cognitive status     Functional Status Assessment   Patient has had a recent decline in their functional status and demonstrates the ability to make significant improvements in function in a reasonable and predictable amount of time.     Equipment Recommendations   Other (comment) (rec life chair and power w/c assessment post rehab venue)      Precautions/Restrictions    Precautions Precautions: Fall Restrictions Weight Bearing Restrictions Per Provider Order: No     Mobility Bed Mobility Overal bed mobility: Needs Assistance Bed Mobility: Supine to Sit, Sit to Supine     Supine to sit: Min assist, Used rails, HOB elevated Sit to supine: Min assist, Used rails, HOB elevated   General bed mobility comments: lateral scoots to [facility] and bridging with rails and cues    Transfers Overall transfer level: Needs assistance Equipment used: Rolling walker (2 wheels), None Transfers: Sit to/from Stand, Bed to chair/wheelchair/BSC Sit to Stand: Min assist, +2 safety/equipment   Squat pivot transfers: Min assist, +2 safety/equipment       General transfer comment: STS with min A +2 for side stepping with RW up to [facility] post + 2 recliner to bed transfer with nursing      Balance Overall balance assessment: Needs assistance Sitting-balance support: Bilateral upper extremity supported, No upper extremity supported Sitting balance-Leahy Scale: Fair Sitting balance - Comments: Prefers UE support; could static sit without support; is on air mattress   Standing balance support: Bilateral upper extremity supported Standing balance-Leahy Scale: Poor                             ADL either performed or assessed with clinical judgement   ADL Overall ADL's : Needs assistance/impaired Eating/Feeding: Set up;Sitting   Grooming: Wash/dry hands;Wash/dry face;Oral care;Brushing hair;Sitting;Set up   Upper Body Bathing: Minimal assistance;Sitting   Lower Body Bathing: Maximal assistance;Sitting/lateral leans   Upper Body Dressing : Contact guard assist;Sitting   Lower Body Dressing: Maximal assistance;Sitting/lateral leans   Toilet Transfer: Maximal assistance;BSC/3in1   Toileting- Clothing Manipulation and Hygiene: Maximal assistance;Sitting/lateral lean  Functional mobility during ADLs: Moderate assistance General ADL Comments: decreased  functional reach to LE's and difficulty with hygiene for peri and buttocks     Vision Baseline Vision/History: 1 Wears glasses;0 No visual deficits Ability to See in Adequate Light: 0 Adequate Patient Visual Report: No change from baseline Vision Assessment?: No apparent visual deficits;Wears glasses for reading            Pertinent Vitals/Pain Pain Assessment Pain Assessment: 0-10 Pain Score: 4  Pain Location: buttock - wound Pain Descriptors / Indicators: Discomfort, Sore Pain Intervention(s): Monitored during session, Repositioned, Relaxation, Premedicated before session     Extremity/Trunk Assessment Upper Extremity Assessment Upper Extremity Assessment: Generalized weakness;Right hand dominant   Lower Extremity Assessment Lower Extremity Assessment: Defer to PT evaluation   Cervical / Trunk Assessment Cervical / Trunk Assessment: Kyphotic;Other exceptions Cervical / Trunk Exceptions: Hx of Bell's Palsy with R facial droop and slurred speech   Communication Communication Communication: No apparent difficulties (low volume) Factors Affecting Communication: Reduced clarity of speech;Other (comment) (slurred speech - baseline, hx of Bell's Palsy)   Cognition Arousal: Alert Behavior During Therapy: WFL for tasks assessed/performed Cognition: History of cognitive impairments             OT - Cognition Comments: higher level processing, STM and executive functioning                 Following commands: Intact       Cueing  General Comments   Cueing Techniques: Verbal cues  repositioned in air mattress bed with pressure releif with pillows on L hip and LE's with elevation,           Home Living Family/patient expects to be discharged to:: Private residence Living Arrangements: Alone Available Help at Discharge: Family;Available PRN/intermittently Type of Home: House Home Access: Ramped entrance     Home Layout: One level     Bathroom  Shower/Tub: Arts development officer Toilet: Handicapped height     Home Equipment: Agricultural consultant (2 wheels);Cane - single point;Grab bars - tub/shower;Wheelchair - manual;Tub bench   Additional Comments: Reports supposed to be getting electric chair but has not yet; PT called during eval from [place] to reschedule power w/c appt,  Has life alert button      Prior Functioning/Environment Prior Level of Function : Needs assist             Mobility Comments: Mainly uses w/c, can transfer independently; reports limited steps since January - states was walking some in rehab back in january ADLs Comments: reports independent with ADLs but getting more difficulty; has assist for IADLs    OT Problem List: Decreased strength;Decreased activity tolerance;Impaired balance (sitting and/or standing);Decreased coordination;Decreased cognition;Decreased safety awareness;Decreased knowledge of use of DME or AE;Decreased knowledge of precautions;Cardiopulmonary status limiting activity;Obesity;Pain;Increased edema   OT Treatment/Interventions: Self-care/ADL training;Therapeutic exercise;Neuromuscular education;Energy conservation;DME and/or AE instruction;Therapeutic activities;Cognitive remediation/compensation;Patient/family education;Balance training      OT Goals(Current goals can be found in the care plan section)   Acute Rehab OT Goals Patient Stated Goal: to get stronger OT Goal Formulation: With patient Time For Goal Achievement: 12/28/23 Potential to Achieve Goals: Fair ADL Goals Pt Will Perform Lower Body Bathing: with min assist;with adaptive equipment;sitting/lateral leans Pt Will Perform Lower Body Dressing: with adaptive equipment;with min assist;sitting/lateral leans Pt Will Transfer to Toilet: bedside commode;with mod assist;squat pivot transfer Pt Will Perform Toileting - Clothing Manipulation and hygiene: with mod assist;sitting/lateral leans Pt/caregiver will Perform Home  Exercise Program: Increased strength;Independently;With  written HEP provided;Both right and left upper extremity   OT Frequency:  Min 2X/week       AM-PAC OT 6 Clicks Daily Activity     Outcome Measure Help from another person eating meals?: A Little Help from another person taking care of personal grooming?: A Little Help from another person toileting, which includes using toliet, bedpan, or urinal?: A Lot Help from another person bathing (including washing, rinsing, drying)?: A Lot Help from another person to put on and taking off regular upper body clothing?: A Little Help from another person to put on and taking off regular lower body clothing?: A Lot 6 Click Score: 15   End of Session Equipment Utilized During Treatment: Gait belt;Rolling walker (2 wheels) Nurse Communication: Mobility status  Activity Tolerance: Patient tolerated treatment well Patient left: in bed;with call bell/phone within reach;with bed alarm set  OT Visit Diagnosis: Unsteadiness on feet (R26.81);Other abnormalities of gait and mobility (R26.89);Muscle weakness (generalized) (M62.81);History of falling (Z91.81);Cognitive communication deficit (R41.841);Pain;Hemiplegia and hemiparesis Hemiplegia - Right/Left: Right Pain - part of body:  (buttocks)                Time: 8647-8581 OT Time Calculation (min): 26 min Charges:  OT General Charges $OT Visit: 1 Visit OT Evaluation $OT Eval Low Complexity: 1 Low Caidin Heidenreich OT/L Acute Rehabilitation Department  [phone]  12/14/2023, 4:44 PM

## 2023-12-14 NOTE — NC FL2 (Signed)
 [place]  MEDICAID FL2 LEVEL OF CARE FORM     IDENTIFICATION  Patient Name: Tanner [name] Birthdate: 06/17/1945 Sex: male Admission Date (Current Location): 12/11/2023  [facility] and [place] Number:  Producer, television/film/video and Address:  [facility],  [address], [place] [address]      Provider Number: [id number]  Attending Physician Name and Address:  Arlice Reichert, MD  Relative Name and Phone Number:       Current Level of Care: Hospital Recommended Level of Care: Skilled Nursing Facility Prior Approval Number:    Date Approved/Denied:   PASRR Number: [id number]  Discharge Plan: SNF    Current Diagnoses: Patient Active Problem List   Diagnosis Date Noted   Cellulitis of buttock 12/12/2023   Pressure injury of buttock, stage 3 (HCC) 12/12/2023   Acute on chronic diastolic (congestive) heart failure (HCC) 12/11/2023   Cellulitis and abscess of right leg 09/13/2023   Renal mass 05/05/2023   History of MRSA infection 04/24/2023   Lung nodule 04/24/2023   Generalized weakness 04/07/2023   Hyperlipidemia 04/07/2023   MRSA bacteremia 04/07/2023   Streptococcal bacteremia 04/07/2023   Infection of prosthetic right knee joint (HCC) 04/06/2023   S/P total knee arthroplasty, right 12/15/2022   Paroxysmal atrial fibrillation (HCC) 08/19/2022   Jaw pain 06/24/2022   Left hand pain 05/29/2022   Chronic systolic CHF (congestive heart failure) (HCC) 02/23/2022   Moderate mitral regurgitation 02/23/2022   Leg swelling 01/29/2022   ED (erectile dysfunction) 01/13/2021   Cyst, dermoid, scalp and neck 01/13/2021   Chronic kidney disease (CKD), stage III (moderate) (HCC) 12/13/2019   Carpal tunnel syndrome 12/05/2019   Aortic atherosclerosis (HCC) 11/15/2019   Peripheral neuropathy 03/17/2017   DM2 (diabetes mellitus, type 2) (HCC) 12/11/2016   Allergic rhinitis 12/11/2016   Degenerative arthritis of left knee 01/30/2016   Morbid obesity (HCC) 01/30/2016    Routine general medical examination at a health care facility 04/20/2014   History of pulmonary embolus (PE) 12/19/2013   Anxiety state 03/29/2007   Depression 03/29/2007   OSA (obstructive sleep apnea) 03/29/2007   Unspecified glaucoma 03/29/2007   BPH (benign prostatic hyperplasia) 03/29/2007   OA (osteoarthritis) of knee 03/29/2007   Essential hypertension 03/29/2007   History of Bell's palsy 03/29/2007    Orientation RESPIRATION BLADDER Height & Weight     Self, Time, Situation, Place  O2 (2L) Incontinent Weight: 217 lb 13 oz (98.8 kg) Height:  5' 4 (162.6 cm)  BEHAVIORAL SYMPTOMS/MOOD NEUROLOGICAL BOWEL NUTRITION STATUS      Continent Diet (2 gram sodium)  AMBULATORY STATUS COMMUNICATION OF NEEDS Skin   Extensive Assist Verbally PU Stage and Appropriate Care (see d/c summary)                       Personal Care Assistance Level of Assistance  Bathing, Feeding, Dressing Bathing Assistance: Limited assistance Feeding assistance: Independent Dressing Assistance: Limited assistance     Functional Limitations Info  Sight, Hearing, Speech Sight Info: Impaired (glasses) Hearing Info: Adequate Speech Info: Adequate    SPECIAL CARE FACTORS FREQUENCY  PT (By licensed PT), OT (By licensed OT)     PT Frequency: 5 x a week OT Frequency: 5 x a week            Contractures Contractures Info: Not present    Additional Factors Info  Code Status, Allergies Code Status Info: full Allergies Info: Lisinopril  Amlodipine  Codeine Sulfate  Current Medications (12/14/2023):  This is the current hospital active medication list Current Facility-Administered Medications  Medication Dose Route Frequency Provider Last Rate Last Admin   acetaminophen  (TYLENOL ) tablet 650 mg  650 mg Oral Q6H PRN Amponsah, Prosper M, MD   650 mg at 12/12/23 9941   Or   acetaminophen  (TYLENOL ) suppository 650 mg  650 mg Rectal Q6H PRN Lou Claretta HERO, MD       amiodarone   (PACERONE ) tablet 200 mg  200 mg Oral Daily Amponsah, Prosper M, MD   200 mg at 12/14/23 0830   apixaban  (ELIQUIS ) tablet 5 mg  5 mg Oral BID Amponsah, Prosper M, MD   5 mg at 12/14/23 0830   ascorbic acid (VITAMIN C) tablet 500 mg  500 mg Oral BID Dahal, Chapman, MD   500 mg at 12/14/23 0830   atorvastatin  (LIPITOR ) tablet 80 mg  80 mg Oral Daily Amponsah, Prosper M, MD   80 mg at 12/14/23 0830   buPROPion  (WELLBUTRIN  SR) 12 hr tablet 150 mg  150 mg Oral BID Amponsah, Prosper M, MD   150 mg at 12/14/23 0830   carvedilol  (COREG ) tablet 6.25 mg  6.25 mg Oral BID WC Amponsah, Prosper M, MD   6.25 mg at 12/14/23 0830   cefTRIAXone  (ROCEPHIN ) 1 g in sodium chloride  0.9 % 100 mL IVPB  1 g Intravenous Q24H Amponsah, Prosper M, MD 200 mL/hr at 12/13/23 1625 1 g at 12/13/23 1625   collagenase (SANTYL) ointment   Topical Daily Dahal, Chapman, MD   Given at 12/14/23 0830   finasteride  (PROSCAR ) tablet 5 mg  5 mg Oral QHS Amponsah, Prosper M, MD   5 mg at 12/13/23 2048   gabapentin  (NEURONTIN ) capsule 300 mg  300 mg Oral QHS Amponsah, Prosper M, MD   300 mg at 12/13/23 2048   gabapentin  (NEURONTIN ) capsule 600 mg  600 mg Oral TID AC Amponsah, Prosper M, MD   600 mg at 12/14/23 1210   Gerhardt's butt cream   Topical BID Dahal, Binaya, MD   Given at 12/14/23 0830   loratadine  (CLARITIN ) tablet 10 mg  10 mg Oral Daily Amponsah, Prosper M, MD   10 mg at 12/14/23 0830   melatonin tablet 5 mg  5 mg Oral QHS Dahal, Binaya, MD       multivitamin with minerals tablet 1 tablet  1 tablet Oral QPM Lou Claretta HERO, MD   1 tablet at 12/13/23 1625   nortriptyline  (PAMELOR ) capsule 20 mg  20 mg Oral QHS Amponsah, Prosper M, MD   20 mg at 12/13/23 2048   nutrition supplement (JUVEN) (JUVEN) powder packet 1 packet  1 packet Oral BID BM Dahal, Chapman, MD   1 packet at 12/14/23 1210   ondansetron  (ZOFRAN ) tablet 4 mg  4 mg Oral Q6H PRN Amponsah, Prosper M, MD       Or   ondansetron  (ZOFRAN ) injection 4 mg  4 mg Intravenous  Q6H PRN Amponsah, Prosper M, MD       Oral care mouth rinse  15 mL Mouth Rinse PRN Dahal, Binaya, MD       pantoprazole  (PROTONIX ) EC tablet 40 mg  40 mg Oral Q1200 Amponsah, Prosper M, MD   40 mg at 12/14/23 1210   potassium chloride  (KLOR-CON ) CR tablet 10 mEq  10 mEq Oral QPM Amponsah, Prosper M, MD   10 mEq at 12/13/23 1626   senna-docusate (Senokot-S) tablet 1 tablet  1 tablet Oral QHS PRN Lou Claretta HERO,  MD       SUMAtriptan (IMITREX) tablet 25 mg  25 mg Oral Q2H PRN Amponsah, Prosper M, MD       zinc sulfate (50mg  elemental zinc) capsule 220 mg  220 mg Oral Daily Dahal, Binaya, MD   220 mg at 12/14/23 0830     Discharge Medications: Please see discharge summary for a list of discharge medications.  Relevant Imaging Results:  Relevant Lab Results:   Additional Information [id number]  Tawni HERO Emily Forse, LCSW

## 2023-12-15 DIAGNOSIS — I5033 Acute on chronic diastolic (congestive) heart failure: Secondary | ICD-10-CM | POA: Diagnosis not present

## 2023-12-15 DIAGNOSIS — L03317 Cellulitis of buttock: Secondary | ICD-10-CM | POA: Diagnosis not present

## 2023-12-15 DIAGNOSIS — L89303 Pressure ulcer of unspecified buttock, stage 3: Secondary | ICD-10-CM | POA: Diagnosis not present

## 2023-12-15 LAB — BASIC METABOLIC PANEL WITH GFR
Anion gap: 8 (ref 5–15)
BUN: 32 mg/dL — ABNORMAL HIGH (ref 8–23)
CO2: 26 mmol/L (ref 22–32)
Calcium: 8.5 mg/dL — ABNORMAL LOW (ref 8.9–10.3)
Chloride: 103 mmol/L (ref 98–111)
Creatinine, Ser: 1.62 mg/dL — ABNORMAL HIGH (ref 0.61–1.24)
GFR, Estimated: 43 mL/min — ABNORMAL LOW (ref >60)
Glucose, Bld: 117 mg/dL — ABNORMAL HIGH (ref 70–99)
Potassium: 4 mmol/L (ref 3.5–5.1)
Sodium: 137 mmol/L (ref 135–145)

## 2023-12-15 MED ORDER — DOXYCYCLINE HYCLATE 100 MG PO TABS
100.0000 mg | ORAL_TABLET | Freq: Two times a day (BID) | ORAL | Status: DC
  Administered 2023-12-15 – 2023-12-17 (×4): 100 mg via ORAL
  Filled 2023-12-15 (×4): qty 1

## 2023-12-15 NOTE — TOC Progression Note (Signed)
 Transition of Care [facility]) - Progression Note    Patient Details  Name: Tanner [name] MRN: [id number] Date of Birth: 1945/06/21  Transition of [facility]) CM/SW Contact  [name], LCSW Phone Number: 12/15/2023, 11:46 AM  Clinical Narrative:    CSW presented bed offers, pt is requesting time to review. CSW also emailed pt's son [name] bed offers. TOC to follow.     Expected Discharge Plan: Skilled Nursing Facility Barriers to Discharge: Continued Medical Work up  Expected Discharge Plan and Services       Living arrangements for the past 2 months: Single Family Home                                       Social Determinants of Health (SDOH) Interventions SDOH Screenings   Food Insecurity: No Food Insecurity (12/11/2023)  Housing: Low Risk  (12/11/2023)  Transportation Needs: No Transportation Needs (12/11/2023)  Utilities: Not At Risk (12/11/2023)  Alcohol Screen: Low Risk  (10/27/2022)  Depression (PHQ2-9): High Risk (09/13/2023)  Financial Resource Strain: Low Risk  (10/27/2022)  Physical Activity: Inactive (10/27/2022)  Social Connections: Moderately Integrated (12/11/2023)  Stress: No Stress Concern Present (12/31/2022)  Recent Concern: Stress - Stress Concern Present (10/30/2022)  Tobacco Use: Medium Risk (12/12/2023)    Readmission Risk Interventions    08/20/2022   11:10 AM  Readmission Risk Prevention Plan  Transportation Screening Complete  PCP or Specialist Appt within 3-5 Days Complete  HRI or Home Care Consult Complete  Palliative Care Screening Not Applicable  Medication Review (RN Care Manager) Complete

## 2023-12-15 NOTE — Plan of Care (Signed)
  Problem: Clinical Measurements: Goal: Ability to maintain clinical measurements within normal limits will improve Outcome: Progressing Goal: Will remain free from infection Outcome: Progressing Goal: Diagnostic test results will improve Outcome: Progressing Goal: Respiratory complications will improve Outcome: Progressing Goal: Cardiovascular complication will be avoided Outcome: Progressing   Problem: Nutrition: Goal: Adequate nutrition will be maintained Outcome: Progressing   Problem: Coping: Goal: Level of anxiety will decrease Outcome: Progressing   Problem: Activity: Goal: Risk for activity intolerance will decrease Outcome: Not Progressing

## 2023-12-15 NOTE — Progress Notes (Signed)
 PROGRESS NOTE    Tanner [name]  FMW:[id number] DOB: 1945-07-03 DOA: 12/11/2023 PCP: [facility]   Brief Narrative: Tanner [name] is a 78 y.o. male with a history of diabetes mellitus type 2, hypertension, hyperlipidemia, obesity, OSA, CHF, DVT, PE, admission-of heart failure, neuropathy, anxiety, depression below, wheelchair-bound.  Patient presented secondary to leg swelling was found to have evidence of acute on chronic diastolic heart failure.  Patient was started on Lasix  IV diuresis.  Patient also found to have evidence of a stage III pressure ulcer with surrounding cellulitis started on antibiotics for management.   Assessment and Plan:  Acute on chronic diastolic heart failure Patient with lower extremity edema and mildly elevated BNP of 427.  Patient with concern for acute on chronic heart failure.  Patient started on Lasix  IV.  Echocardiogram significant for an LVEF of 66 5% with grade 1 diastolic dysfunction.  Weight on admission of 217.81 pounds with no further weights obtained.  Net -6825 mL output documented. Lasix  discontinued.  Acute respiratory failure with hypoxia -Wean oxygen  to room air -Ambulatory pulse ox  Primary hypertension -Coreg   Pressure injury, stage III Cellulitis Infected with surrounding cellulitis. Patient managed with ceftriaxone  - Continue ceftriaxone  IV  History of prosthetic joint infection Patient is currently on chronic doxycycline . - Continue outpatient doxycycline   Acute metabolic encephalopathy Transient. Resolved.  Chronic bilateral lower extremity edema History of DVT/PE Venous duplex ordered and negative for DVT. -Continue Eliquis   Atrial fibrillation -Continue amiodarone  and Eliquis   Hyperlipidemia -Continue Lipitor   Anxiety Depression -Continue Wellbutrin   BPH -Continue finasteride   GERD -Continue Protonix   Migraine headache Noted.  Peripheral neuropathy -Continue gabapentin    DVT  prophylaxis: Eliquis  Code Status:   Code Status: Full Code Family Communication: None at bedside Disposition Plan: Discharge to SNF pending bed availability   Consultants:  None  Procedures:  Transthoracic Echocardiogram  Antimicrobials: Ceftriaxone  Doxycycline     Subjective: No specific concerns today. Breathing well.  Objective: BP (!) 113/48 (BP Location: Left Arm)   Pulse 60   Temp 97.7 F (36.5 C) (Oral)   Resp 16   Ht 5' 4 (1.626 m)   Wt 98.8 kg   SpO2 95%   BMI 37.39 kg/m   Examination:  General exam: Appears calm and comfortable Respiratory system: Clear to auscultation. Respiratory effort normal. Cardiovascular system: S1 & S2 heard, RRR. No murmurs. Gastrointestinal system: Abdomen is nondistended, soft and nontender. Normal bowel sounds heard. Central nervous system: Alert and oriented. No focal neurological deficits. Psychiatry: Judgement and insight appear normal. Mood & affect appropriate.    Data Reviewed: I have personally reviewed following labs and imaging studies  CBC Lab Results  Component Value Date   WBC 10.0 12/13/2023   RBC 4.59 12/13/2023   HGB 10.1 (L) 12/13/2023   HCT 36.2 (L) 12/13/2023   MCV 78.9 (L) 12/13/2023   MCH 22.0 (L) 12/13/2023   PLT 223 12/13/2023   MCHC 27.9 (L) 12/13/2023   RDW 25.9 (H) 12/13/2023   LYMPHSABS 2.4 12/13/2023   MONOABS 0.8 12/13/2023   EOSABS 0.2 12/13/2023   BASOSABS 0.1 12/13/2023     Last metabolic panel Lab Results  Component Value Date   NA 136 12/13/2023   K 3.7 12/13/2023   CL 102 12/13/2023   CO2 26 12/13/2023   BUN 29 (H) 12/13/2023   CREATININE 1.89 (H) 12/13/2023   GLUCOSE 85 12/13/2023   GFRNONAA 36 (L) 12/13/2023   GFRAA 55 (L) 01/23/2020   CALCIUM  8.2 (  L) 12/13/2023   PHOS 3.8 12/12/2023   PROT 5.7 (L) 12/11/2023   ALBUMIN 2.7 (L) 12/11/2023   LABGLOB 1.8 01/23/2020   AGRATIO 2.3 (H) 01/23/2020   BILITOT 0.7 12/11/2023   ALKPHOS 86 12/11/2023   AST 15 12/11/2023    ALT 11 12/11/2023   ANIONGAP 8 12/13/2023    GFR: Estimated Creatinine Clearance: 33.6 mL/min (A) (by C-G formula based on SCr of 1.89 mg/dL (H)).  Recent Results (from the past 240 hours)  MRSA Next Gen by PCR, Nasal     Status: None   Collection Time: 12/13/23 12:48 PM   Specimen: Nasal Mucosa; Nasal Swab  Result Value Ref Range Status   MRSA by PCR Next Gen NOT DETECTED NOT DETECTED Final    Comment: (NOTE) The GeneXpert MRSA Assay (FDA approved for NASAL specimens only), is one component of a comprehensive MRSA colonization surveillance program. It is not intended to diagnose MRSA infection nor to guide or monitor treatment for MRSA infections. Test performance is not FDA approved in patients less than 50 years old. Performed at [facility], [address], [place], [place] [address]       Radiology Studies: VAS US  LOWER EXTREMITY VENOUS (DVT) Result Date: 12/13/2023  Lower Venous DVT Study Patient Name:  Tanner [name]  Date of Exam:   12/13/2023 Medical Rec #: [id number]       Accession #:    [id number] Date of Birth: [birth year]       Patient Gender: M Patient Age:   49 years Exam Location:  [facility] Procedure:      VAS US  LOWER EXTREMITY VENOUS (DVT) Referring Phys: CHAPMAN ROTA --------------------------------------------------------------------------------  Indications: Edema.  Risk Factors: Hx of PE DVT LLE 2018 & 2014. Anticoagulation: Eliquis . Limitations: Poor ultrasound/tissue interface and body habitus. Comparison Study: Previous exam on 03/18/2023 was negative for DVT Performing Technologist: Ezzie POTTERS RVT, RDMS  Examination Guidelines: A complete evaluation includes B-mode imaging, spectral Doppler, color Doppler, and power Doppler as needed of all accessible portions of each vessel. Bilateral testing is considered an integral part of a complete examination. Limited examinations for reoccurring indications may be performed as noted. The reflux  portion of the exam is performed with the patient in reverse Trendelenburg.  +---------+---------------+---------+-----------+----------+--------------+ RIGHT    CompressibilityPhasicitySpontaneityPropertiesThrombus Aging +---------+---------------+---------+-----------+----------+--------------+ CFV      Full           Yes      Yes                                 +---------+---------------+---------+-----------+----------+--------------+ SFJ      Full                                                        +---------+---------------+---------+-----------+----------+--------------+ FV Prox  Full           Yes      Yes                                 +---------+---------------+---------+-----------+----------+--------------+ FV Mid   Full           Yes      Yes                                 +---------+---------------+---------+-----------+----------+--------------+  FV DistalFull           Yes      Yes                                 +---------+---------------+---------+-----------+----------+--------------+ PFV      Full                                                        +---------+---------------+---------+-----------+----------+--------------+ POP      Full           Yes      Yes                                 +---------+---------------+---------+-----------+----------+--------------+ PTV      Full                                                        +---------+---------------+---------+-----------+----------+--------------+ PERO     Full                                                        +---------+---------------+---------+-----------+----------+--------------+   +---------+---------------+---------+-----------+----------+-------------------+ LEFT     CompressibilityPhasicitySpontaneityPropertiesThrombus Aging      +---------+---------------+---------+-----------+----------+-------------------+ CFV      Full           Yes       Yes                                      +---------+---------------+---------+-----------+----------+-------------------+ SFJ      Full                                                             +---------+---------------+---------+-----------+----------+-------------------+ FV Prox  Full           Yes      Yes                                      +---------+---------------+---------+-----------+----------+-------------------+ FV Mid   Full           Yes      Yes                                      +---------+---------------+---------+-----------+----------+-------------------+ FV DistalFull           Yes      Yes                  Not  well visualized +---------+---------------+---------+-----------+----------+-------------------+ PFV      Full                                                             +---------+---------------+---------+-----------+----------+-------------------+ POP      Full           Yes      Yes                                      +---------+---------------+---------+-----------+----------+-------------------+ PTV      Full                                                             +---------+---------------+---------+-----------+----------+-------------------+ PERO     Full                                                             +---------+---------------+---------+-----------+----------+-------------------+     Summary: BILATERAL: - No evidence of deep vein thrombosis seen in the lower extremities, bilaterally. -No evidence of popliteal cyst, bilaterally.   *See table(s) above for measurements and observations. Electronically signed by Fonda Rim on 12/13/2023 at 4:43:07 PM.    Final       LOS: 4 days    Elgin Lam, MD [facility] 12/15/2023, 10:32 AM   If 7PM-7AM, please contact night-coverage [email or web address]

## 2023-12-15 NOTE — Hospital Course (Addendum)
 Tanner [name] is a 78 y.o. male with a history of diabetes mellitus type 2, hypertension, hyperlipidemia, obesity, OSA, CHF, DVT, PE, admission-of heart failure, neuropathy, anxiety, depression below, wheelchair-bound.  Patient presented secondary to leg swelling was found to have evidence of acute on chronic diastolic heart failure.  Patient was started on Lasix  IV diuresis.  Patient also found to have evidence of a stage III pressure ulcer with surrounding cellulitis started on antibiotics for management.

## 2023-12-16 ENCOUNTER — Encounter: Payer: Self-pay | Admitting: *Deleted

## 2023-12-16 DIAGNOSIS — L03317 Cellulitis of buttock: Secondary | ICD-10-CM | POA: Diagnosis not present

## 2023-12-16 DIAGNOSIS — L89303 Pressure ulcer of unspecified buttock, stage 3: Secondary | ICD-10-CM | POA: Diagnosis not present

## 2023-12-16 DIAGNOSIS — I5033 Acute on chronic diastolic (congestive) heart failure: Secondary | ICD-10-CM | POA: Diagnosis not present

## 2023-12-16 NOTE — TOC Progression Note (Signed)
 Transition of Care [facility]) - Progression Note    Patient Details  Name: Tanner [name] MRN: [id number] Date of Birth: 29-Apr-1946  [facility] CM/SW Contact  [name], LCSW Phone Number: 12/16/2023, 2:57 PM  Clinical Narrative:     Pt has chosen [employer], CSW to start insurance authorization for SNF placement. TOC to follow.  Expected Discharge Plan: Skilled Nursing Facility Barriers to Discharge: Continued Medical Work up  Expected Discharge Plan and Services       Living arrangements for the past 2 months: Single Family Home                                       Social Determinants of Health (SDOH) Interventions SDOH Screenings   Food Insecurity: No Food Insecurity (12/11/2023)  Housing: Low Risk  (12/11/2023)  Transportation Needs: No Transportation Needs (12/11/2023)  Utilities: Not At Risk (12/11/2023)  Alcohol Screen: Low Risk  (10/27/2022)  Depression (PHQ2-9): High Risk (09/13/2023)  Financial Resource Strain: Low Risk  (10/27/2022)  Physical Activity: Inactive (10/27/2022)  Social Connections: Moderately Integrated (12/11/2023)  Stress: No Stress Concern Present (12/31/2022)  Recent Concern: Stress - Stress Concern Present (10/30/2022)  Tobacco Use: Medium Risk (12/12/2023)    Readmission Risk Interventions    08/20/2022   11:10 AM  Readmission Risk Prevention Plan  Transportation Screening Complete  PCP or Specialist Appt within 3-5 Days Complete  HRI or Home Care Consult Complete  Palliative Care Screening Not Applicable  Medication Review (RN Care Manager) Complete

## 2023-12-16 NOTE — Congregational Nurse Program (Signed)
  Dept: [phone]   Congregational Nurse Program Note  Date of Encounter: 12/16/2023  Past Medical History: Past Medical History:  Diagnosis Date   Acute kidney failure (HCC)    Anxiety    Arthritis    Back pain    BPH (benign prostatic hypertrophy)    CHF (congestive heart failure) (HCC)    Clostridium difficile infection    Clotting disorder (HCC)    Depression    Diabetes (HCC) 12/11/2016   type 2    DVT (deep venous thrombosis) (HCC)    DVT of deep femoral vein, right (HCC) 06/29/2018   Edema, lower extremity    High cholesterol    Hypertension    Joint pain    Low back pain potentially associated with spinal stenosis    Neuromuscular disorder (HCC)    Neuropathy of lower extremity    bilateral   OSA (obstructive sleep apnea)    pt denies    Osteoarthritis    PE (pulmonary embolism)    Pneumonia    hx of x 2    Ventral hernia     Encounter Details:  Community Questionnaire - 12/16/23 1532     Questionnaire           Ask client: Do you give verbal consent for me to treat you today? Yes    Student Assistance N/A    [facility]    Population Status Unknown    Insurance Medicare;Private or VA Insurance    Insurance/Financial Assistance Referral N/A    Medication N/A    Medical Provider Yes    Screening Referrals Made N/A    Medical Referrals Made N/A    Medical Appointment Completed N/A    CNP Interventions Case Management;Advocate/Support    Screenings CN Performed N/A    ED Visit Averted N/A    Life-Saving Intervention Made N/A

## 2023-12-16 NOTE — Congregational Nurse Program (Signed)
 Patient with sacral pressure ulcer. Lives alone. No family near by to help with care. Will need placement. Last snf attended was [facility]. Note to case manager

## 2023-12-16 NOTE — Progress Notes (Signed)
 Physical Therapy Treatment Patient Details Name: Tanner [name] MRN: [id number] DOB: 1945/08/10 Today's Date: 12/16/2023   History of Present Illness Pt is 78 yo male admitted on 12/11/23 with acute on chronic heart failure as well as stage III pressure ulcer on sacrum. Pt with hx including but not limited to  DM2, HTN, HLD, obesity, OSA, CHF, DVT/PE, A-fib on Eliquis , chronic diastolic CHF, neuropathy, anxiety/depression, Bil TKA with R side requiring I and D 10/24.    PT Comments  Pt OOB in recliner visiting with his [name].  Severe R lean.  Noted B knee flexion tightness.  Assisted back to bed was difficult.  General bed mobility comments: Required Mod/Max Assist back to bed with full support B LE and Total Assist to scoot to [facility].  Positioned to comfort R sidelying multiple pillows. General bed mobility comments: Required Mod/Max Assist back to bed with full support B LE and Total Assist to scoot to [facility].  Positioned to comfort R sidelying multiple pillows. LPT has rec Pt will need ST Rehab at SNF to address mobility and functional decline prior to safely returning home.    If plan is discharge home, recommend the following: A little help with walking and/or transfers;A little help with bathing/dressing/bathroom;Assistance with cooking/housework;Help with stairs or ramp for entrance   Can travel by private vehicle     No  Equipment Recommendations  None recommended by PT    Recommendations for Other Services       Precautions / Restrictions Precautions Precautions: Fall Precaution/Restrictions Comments: Hx Bells Palsy NON amb/WC level Restrictions Weight Bearing Restrictions Per Provider Order: No     Mobility  Bed Mobility Overal bed mobility: Needs Assistance Bed Mobility: Sit to Supine       Sit to supine: Mod assist, Max assist, +2 for physical assistance, +2 for safety/equipment   General bed mobility comments: Required Mod/Max Assist back to bed with full support B LE  and Total Assist to scoot to [facility].  Positioned to comfort R sidelying multiple pillows.    Transfers Overall transfer level: Needs assistance Equipment used: None               General transfer comment: performed a lateral scoot to his RIGHT with difficult requiring + 2 Max Assist.  Posture is poor forward flexed.  Max c/o weakness.  Max c/o buttock pain with scooting.    Ambulation/Gait               General Gait Details: non amb since January   Stairs             Wheelchair Mobility     Tilt Bed    Modified Rankin (Stroke Patients Only)       Balance                                            Communication Communication Communication: No apparent difficulties Factors Affecting Communication: Reduced clarity of speech;Other (comment)  Cognition Arousal: Alert Behavior During Therapy: WFL for tasks assessed/performed   PT - Cognitive impairments: No apparent impairments                       PT - Cognition Comments: AxO x 3 pleasant Following commands: Intact      Cueing Cueing Techniques: Verbal cues  Exercises      General Comments  Pertinent Vitals/Pain Pain Assessment Pain Assessment: Faces Faces Pain Scale: Hurts little more Pain Location: buttock with activity scooting to HOB Pain Descriptors / Indicators: Discomfort, Sore Pain Intervention(s): Monitored during session, Repositioned    Home Living                          Prior Function            PT Goals (current goals can now be found in the care plan section) Progress towards PT goals: Progressing toward goals    Frequency    Min 1X/week      PT Plan      Co-evaluation              AM-PAC PT 6 Clicks Mobility   Outcome Measure  Help needed turning from your back to your side while in a flat bed without using bedrails?: A Lot Help needed moving from lying on your back to sitting on the side of a flat bed  without using bedrails?: A Lot Help needed moving to and from a bed to a chair (including a wheelchair)?: A Lot Help needed standing up from a chair using your arms (e.g., wheelchair or bedside chair)?: A Lot Help needed to walk in hospital room?: Total Help needed climbing 3-5 steps with a railing? : Total 6 Click Score: 10    End of Session Equipment Utilized During Treatment: Gait belt Activity Tolerance: Patient limited by fatigue Patient left: in bed;with call bell/phone within reach Nurse Communication: Mobility status PT Visit Diagnosis: Other abnormalities of gait and mobility (R26.89);Muscle weakness (generalized) (M62.81)     Time: 8485-8469 PT Time Calculation (min) (ACUTE ONLY): 16 min  Charges:    $Therapeutic Activity: 8-22 mins PT General Charges $$ ACUTE PT VISIT: 1 Visit                    Katheryn Leap  PTA Acute  Rehabilitation Services Office M-F          [phone]

## 2023-12-16 NOTE — Progress Notes (Addendum)
 PROGRESS NOTE    Tanner [name]  FMW:[id number] DOB: [birth year] DOA: 12/11/2023 PCP: [facility]   Brief Narrative: Tanner [name] is a 78 y.o. male with a history of diabetes mellitus type 2, hypertension, hyperlipidemia, obesity, OSA, CHF, DVT, PE, admission-of heart failure, neuropathy, anxiety, depression below, wheelchair-bound.  Patient presented secondary to leg swelling was found to have evidence of acute on chronic diastolic heart failure.  Patient was started on Lasix  IV diuresis.  Patient also found to have evidence of a stage III pressure ulcer with surrounding cellulitis started on antibiotics for management.   Assessment and Plan:  Acute on chronic diastolic heart failure Patient with lower extremity edema and mildly elevated BNP of 427.  Patient with concern for acute on chronic heart failure.  Patient started on Lasix  IV.  Echocardiogram significant for an LVEF of 66 5% with grade 1 diastolic dysfunction.  Weight on admission of 217.81 pounds with no further weights obtained.  Net -7,545 mL output documented. Lasix  discontinued.  Acute respiratory failure with hypoxia -Wean oxygen  to room air  Primary hypertension -Coreg   Pressure injury, stage III Cellulitis Infected with surrounding cellulitis. Patient managed with ceftriaxone  -Continue Ceftriaxone  IV  History of prosthetic joint infection Patient is currently on chronic doxycycline . -Continue outpatient doxycycline   Acute metabolic encephalopathy Transient. Resolved.  Chronic bilateral lower extremity edema History of DVT/PE Venous duplex ordered and negative for DVT. -Continue Eliquis   Atrial fibrillation -Continue amiodarone  and Eliquis   Hyperlipidemia -Continue Lipitor   Anxiety Depression -Continue Wellbutrin   BPH -Continue finasteride   GERD -Continue Protonix   Migraine headache Noted.  Peripheral neuropathy -Continue gabapentin   Obesity, class II Estimated body mass  index is 37.39 kg/m as calculated from the following:   Height as of this encounter: 5' 4 (1.626 m).   Weight as of this encounter: 98.8 kg.   DVT prophylaxis: Eliquis  Code Status:   Code Status: Full Code Family Communication: None at bedside Disposition Plan: Discharge to SNF pending bed availability   Consultants:  None  Procedures:  Transthoracic Echocardiogram  Antimicrobials: Ceftriaxone  Doxycycline     Subjective: No issues this morning except taking a while to be transferred to chair.  Objective: BP (!) 113/43 (BP Location: Left Arm)   Pulse 73   Temp 98 F (36.7 C) (Oral)   Resp (!) 22   Ht 5' 4 (1.626 m)   Wt 98.8 kg   SpO2 (!) 85%   BMI 37.39 kg/m   Examination:  General exam: Appears calm and comfortable Respiratory system: Clear to auscultation. Respiratory effort normal. Cardiovascular system: S1 & S2 heard, RRR. No murmurs, rubs, gallops or clicks. Gastrointestinal system: Abdomen is nondistended, soft and nontender. Normal bowel sounds heard. Central nervous system: Alert and oriented. No focal neurological deficits. Psychiatry: Judgement and insight appear normal. Mood & affect appropriate.    Data Reviewed: I have personally reviewed following labs and imaging studies  CBC Lab Results  Component Value Date   WBC 10.0 12/13/2023   RBC 4.59 12/13/2023   HGB 10.1 (L) 12/13/2023   HCT 36.2 (L) 12/13/2023   MCV 78.9 (L) 12/13/2023   MCH 22.0 (L) 12/13/2023   PLT 223 12/13/2023   MCHC 27.9 (L) 12/13/2023   RDW 25.9 (H) 12/13/2023   LYMPHSABS 2.4 12/13/2023   MONOABS 0.8 12/13/2023   EOSABS 0.2 12/13/2023   BASOSABS 0.1 12/13/2023     Last metabolic panel Lab Results  Component Value Date   NA 137 12/15/2023   K 4.0  12/15/2023   CL 103 12/15/2023   CO2 26 12/15/2023   BUN 32 (H) 12/15/2023   CREATININE 1.62 (H) 12/15/2023   GLUCOSE 117 (H) 12/15/2023   GFRNONAA 43 (L) 12/15/2023   GFRAA 55 (L) 01/23/2020   CALCIUM  8.5 (L)  12/15/2023   PHOS 3.8 12/12/2023   PROT 5.7 (L) 12/11/2023   ALBUMIN 2.7 (L) 12/11/2023   LABGLOB 1.8 01/23/2020   AGRATIO 2.3 (H) 01/23/2020   BILITOT 0.7 12/11/2023   ALKPHOS 86 12/11/2023   AST 15 12/11/2023   ALT 11 12/11/2023   ANIONGAP 8 12/15/2023    GFR: Estimated Creatinine Clearance: 39.2 mL/min (A) (by C-G formula based on SCr of 1.62 mg/dL (H)).  Recent Results (from the past 240 hours)  MRSA Next Gen by PCR, Nasal     Status: None   Collection Time: 12/13/23 12:48 PM   Specimen: Nasal Mucosa; Nasal Swab  Result Value Ref Range Status   MRSA by PCR Next Gen NOT DETECTED NOT DETECTED Final    Comment: (NOTE) The GeneXpert MRSA Assay (FDA approved for NASAL specimens only), is one component of a comprehensive MRSA colonization surveillance program. It is not intended to diagnose MRSA infection nor to guide or monitor treatment for MRSA infections. Test performance is not FDA approved in patients less than 70 years old. Performed at [facility], [address] [place], [place] [address]       Radiology Studies: No results found.     LOS: 5 days    Elgin Lam, MD [facility] 12/16/2023, 11:44 AM   If 7PM-7AM, please contact night-coverage [email or web address]

## 2023-12-17 DIAGNOSIS — E785 Hyperlipidemia, unspecified: Secondary | ICD-10-CM | POA: Diagnosis not present

## 2023-12-17 DIAGNOSIS — G909 Disorder of the autonomic nervous system, unspecified: Secondary | ICD-10-CM | POA: Diagnosis not present

## 2023-12-17 DIAGNOSIS — E119 Type 2 diabetes mellitus without complications: Secondary | ICD-10-CM | POA: Diagnosis not present

## 2023-12-17 DIAGNOSIS — L89303 Pressure ulcer of unspecified buttock, stage 3: Secondary | ICD-10-CM | POA: Diagnosis not present

## 2023-12-17 DIAGNOSIS — I82409 Acute embolism and thrombosis of unspecified deep veins of unspecified lower extremity: Secondary | ICD-10-CM | POA: Diagnosis not present

## 2023-12-17 DIAGNOSIS — L89323 Pressure ulcer of left buttock, stage 3: Secondary | ICD-10-CM | POA: Diagnosis not present

## 2023-12-17 DIAGNOSIS — N1832 Chronic kidney disease, stage 3b: Secondary | ICD-10-CM | POA: Diagnosis not present

## 2023-12-17 DIAGNOSIS — R609 Edema, unspecified: Secondary | ICD-10-CM | POA: Diagnosis not present

## 2023-12-17 DIAGNOSIS — I5031 Acute diastolic (congestive) heart failure: Secondary | ICD-10-CM | POA: Diagnosis not present

## 2023-12-17 DIAGNOSIS — I5033 Acute on chronic diastolic (congestive) heart failure: Secondary | ICD-10-CM | POA: Diagnosis not present

## 2023-12-17 DIAGNOSIS — I509 Heart failure, unspecified: Secondary | ICD-10-CM | POA: Diagnosis not present

## 2023-12-17 DIAGNOSIS — I480 Paroxysmal atrial fibrillation: Secondary | ICD-10-CM | POA: Diagnosis not present

## 2023-12-17 DIAGNOSIS — E1142 Type 2 diabetes mellitus with diabetic polyneuropathy: Secondary | ICD-10-CM | POA: Diagnosis not present

## 2023-12-17 DIAGNOSIS — R0902 Hypoxemia: Secondary | ICD-10-CM | POA: Diagnosis not present

## 2023-12-17 DIAGNOSIS — J9601 Acute respiratory failure with hypoxia: Secondary | ICD-10-CM | POA: Diagnosis not present

## 2023-12-17 DIAGNOSIS — R2689 Other abnormalities of gait and mobility: Secondary | ICD-10-CM | POA: Diagnosis not present

## 2023-12-17 DIAGNOSIS — I5032 Chronic diastolic (congestive) heart failure: Secondary | ICD-10-CM | POA: Diagnosis not present

## 2023-12-17 DIAGNOSIS — I82593 Chronic embolism and thrombosis of other specified deep vein of lower extremity, bilateral: Secondary | ICD-10-CM | POA: Diagnosis not present

## 2023-12-17 DIAGNOSIS — I10 Essential (primary) hypertension: Secondary | ICD-10-CM | POA: Diagnosis not present

## 2023-12-17 DIAGNOSIS — I2510 Atherosclerotic heart disease of native coronary artery without angina pectoris: Secondary | ICD-10-CM | POA: Diagnosis not present

## 2023-12-17 DIAGNOSIS — F32A Depression, unspecified: Secondary | ICD-10-CM | POA: Diagnosis not present

## 2023-12-17 DIAGNOSIS — E1122 Type 2 diabetes mellitus with diabetic chronic kidney disease: Secondary | ICD-10-CM | POA: Diagnosis not present

## 2023-12-17 DIAGNOSIS — Z86711 Personal history of pulmonary embolism: Secondary | ICD-10-CM | POA: Diagnosis not present

## 2023-12-17 DIAGNOSIS — L89313 Pressure ulcer of right buttock, stage 3: Secondary | ICD-10-CM | POA: Diagnosis not present

## 2023-12-17 DIAGNOSIS — L03317 Cellulitis of buttock: Secondary | ICD-10-CM | POA: Diagnosis not present

## 2023-12-17 DIAGNOSIS — I129 Hypertensive chronic kidney disease with stage 1 through stage 4 chronic kidney disease, or unspecified chronic kidney disease: Secondary | ICD-10-CM | POA: Diagnosis not present

## 2023-12-17 DIAGNOSIS — N400 Enlarged prostate without lower urinary tract symptoms: Secondary | ICD-10-CM | POA: Diagnosis not present

## 2023-12-17 DIAGNOSIS — R451 Restlessness and agitation: Secondary | ICD-10-CM | POA: Diagnosis not present

## 2023-12-17 DIAGNOSIS — I5030 Unspecified diastolic (congestive) heart failure: Secondary | ICD-10-CM | POA: Diagnosis not present

## 2023-12-17 DIAGNOSIS — I4891 Unspecified atrial fibrillation: Secondary | ICD-10-CM | POA: Diagnosis not present

## 2023-12-17 DIAGNOSIS — F329 Major depressive disorder, single episode, unspecified: Secondary | ICD-10-CM | POA: Diagnosis not present

## 2023-12-17 DIAGNOSIS — Z7401 Bed confinement status: Secondary | ICD-10-CM | POA: Diagnosis not present

## 2023-12-17 DIAGNOSIS — F419 Anxiety disorder, unspecified: Secondary | ICD-10-CM | POA: Diagnosis not present

## 2023-12-17 DIAGNOSIS — T8450XA Infection and inflammatory reaction due to unspecified internal joint prosthesis, initial encounter: Secondary | ICD-10-CM | POA: Diagnosis not present

## 2023-12-17 DIAGNOSIS — M6281 Muscle weakness (generalized): Secondary | ICD-10-CM | POA: Diagnosis not present

## 2023-12-17 DIAGNOSIS — R41841 Cognitive communication deficit: Secondary | ICD-10-CM | POA: Diagnosis not present

## 2023-12-17 MED ORDER — ASCORBIC ACID 500 MG PO TABS: 500.0000 mg | ORAL_TABLET | Freq: Two times a day (BID) | ORAL | Status: AC

## 2023-12-17 MED ORDER — GABAPENTIN 300 MG PO CAPS: 300.0000 mg | ORAL_CAPSULE | Freq: Two times a day (BID) | ORAL | Status: AC

## 2023-12-17 MED ORDER — JUVEN PO PACK: 1.0000 | PACK | Freq: Two times a day (BID) | ORAL | Status: AC

## 2023-12-17 MED ORDER — ZINC SULFATE 220 (50 ZN) MG PO CAPS
220.0000 mg | ORAL_CAPSULE | Freq: Every day | ORAL | Status: AC
Start: 2023-12-18 — End: ?

## 2023-12-17 MED ORDER — COLLAGENASE 250 UNIT/GM EX OINT: TOPICAL_OINTMENT | Freq: Every day | CUTANEOUS | Status: AC

## 2023-12-17 MED ORDER — SODIUM CHLORIDE 0.9 % IV SOLN
1.0000 g | Freq: Once | INTRAVENOUS | Status: AC
  Administered 2023-12-17: 1 g via INTRAVENOUS
  Filled 2023-12-17: qty 10

## 2023-12-17 MED ORDER — MELATONIN 5 MG PO TABS: 5.0000 mg | ORAL_TABLET | Freq: Every day | ORAL | Status: AC

## 2023-12-17 NOTE — Progress Notes (Signed)
 Report successfully called to [facility]

## 2023-12-17 NOTE — Progress Notes (Signed)
 Occupational Therapy Treatment Patient Details Name: Tanner [name] MRN: [id number] DOB: Nov 25, 1945 Today's Date: 12/17/2023   History of present illness Pt is 78 yo male admitted on 12/11/23 with acute on chronic heart failure as well as stage III pressure ulcer on sacrum. Pt with hx including but not limited to  DM2, HTN, HLD, obesity, OSA, CHF, DVT/PE, A-fib on Eliquis , chronic diastolic CHF, neuropathy, anxiety/depression, Bil TKA with R side requiring I and D 10/24.   OT comments  Patient seen for skilled OT session this am. Improvements noted on postural alignment and STS ability with support during ADL's and transfers. Addressed skin integrity, positioning and activity tolerance with steady progress.  See below for current status. Patient requires continued Acute care hospital level OT services to progress safety and functional performance and allow for discharge. Patient will benefit from continued inpatient follow up therapy, <3 hours/day         If plan is discharge home, recommend the following:  Two people to help with walking and/or transfers;A lot of help with bathing/dressing/bathroom;Assistance with cooking/housework;Direct supervision/assist for medications management;Direct supervision/assist for financial management;Assist for transportation;Help with stairs or ramp for entrance;Supervision due to cognitive status   Equipment Recommendations  Other (comment) ([facility] was to eval for power w/c but patient hospitalized and appt moved to august, tba for otherwise needs post rehab)       Precautions / Restrictions Precautions Precautions: Fall Precaution/Restrictions Comments: Hx Bells Palsy NON amb/WC level Restrictions Weight Bearing Restrictions Per Provider Order: No       Mobility Bed Mobility Overal bed mobility: Needs Assistance Bed Mobility: Supine to Sit     Supine to sit: Min assist, HOB elevated, Used rails     General bed mobility comments: improved upright  posture this session    Transfers Overall transfer level: Needs assistance Equipment used:  (use of STEDY frame for standing tolerance and bed to recliner) Transfers: Sit to/from Stand, Bed to chair/wheelchair/BSC Sit to Stand: Mod assist, From elevated surface           General transfer comment: improved core strength and upright posture Transfer via Lift Equipment: Stedy   Balance Overall balance assessment: Needs assistance Sitting-balance support: Bilateral upper extremity supported, No upper extremity supported Sitting balance-Leahy Scale: Fair     Standing balance support: Bilateral upper extremity supported Standing balance-Leahy Scale: Poor                             ADL either performed or assessed with clinical judgement   ADL Overall ADL's : Needs assistance/impaired Eating/Feeding: Set up;Sitting   Grooming: Wash/dry hands;Wash/dry face;Oral care;Sitting;Set up Grooming Details (indicate cue type and reason): performed some hygiene in STEDY frame standing level Upper Body Bathing: Minimal assistance;Sitting                           Functional mobility during ADLs: Moderate assistance;+2 for physical assistance;+2 for safety/equipment General ADL Comments: increased sititng posture and STS x 6 in STEDY frame up to 2-3 minutes for simple self care and transfers    Extremity/Trunk Assessment Upper Extremity Assessment Upper Extremity Assessment: Generalized weakness   Lower Extremity Assessment Lower Extremity Assessment: Defer to PT evaluation        Vision   Vision Assessment?: No apparent visual deficits;Wears glasses for reading         Communication Communication Communication: No apparent difficulties  Cognition Arousal: Alert Behavior During Therapy: WFL for tasks assessed/performed Cognition: History of cognitive impairments             OT - Cognition Comments: higher level processing, STM and executive  functioning                 Following commands: Intact        Cueing   Cueing Techniques: Verbal cues        General Comments LE wraps intact, SpO2 96% on 3 ltrs via [place]    Pertinent Vitals/ Pain       Pain Assessment Pain Assessment: Faces Faces Pain Scale: Hurts little more Pain Location: peri and buttcks region Pain Descriptors / Indicators: Discomfort, Sore Pain Intervention(s): Monitored during session, Repositioned, Other (comment) (patient applied warm cloth to peri region for releif)   Frequency  Min 2X/week        Progress Toward Goals  OT Goals(current goals can now be found in the care plan section)  Progress towards OT goals: Progressing toward goals  Acute Rehab OT Goals Patient Stated Goal: to keep getting better OT Goal Formulation: With patient Time For Goal Achievement: 12/28/23 Potential to Achieve Goals: Fair ADL Goals Pt Will Perform Lower Body Bathing: with min assist;with adaptive equipment;sitting/lateral leans Pt Will Perform Lower Body Dressing: with adaptive equipment;with min assist;sitting/lateral leans Pt Will Transfer to Toilet: bedside commode;with mod assist;squat pivot transfer Pt Will Perform Toileting - Clothing Manipulation and hygiene: with mod assist;sitting/lateral leans Pt/caregiver will Perform Home Exercise Program: Increased strength;Independently;With written HEP provided;Both right and left upper extremity  Plan         AM-PAC OT 6 Clicks Daily Activity     Outcome Measure   Help from another person eating meals?: A Little Help from another person taking care of personal grooming?: A Little Help from another person toileting, which includes using toliet, bedpan, or urinal?: A Lot Help from another person bathing (including washing, rinsing, drying)?: A Lot Help from another person to put on and taking off regular upper body clothing?: A Little Help from another person to put on and taking off regular lower  body clothing?: A Lot 6 Click Score: 15    End of Session Equipment Utilized During Treatment: Gait belt;Other (comment) (STEDY)  OT Visit Diagnosis: Unsteadiness on feet (R26.81);Other abnormalities of gait and mobility (R26.89);Muscle weakness (generalized) (M62.81);History of falling (Z91.81);Cognitive communication deficit (R41.841);Pain;Hemiplegia and hemiparesis Symptoms and signs involving cognitive functions: Cerebral infarction Hemiplegia - Right/Left: Right   Activity Tolerance Patient tolerated treatment well   Patient Left in chair;with call bell/phone within reach;with chair alarm set   Nurse Communication Mobility status;Need for lift equipment;Other (comment) (STEDY was helpful this session to allow more time in standing with added support and safety)        Time: 9057-8973 OT Time Calculation (min): 44 min  Charges: OT General Charges $OT Visit: 1 Visit OT Treatments $Self Care/Home Management : 8-22 mins $Therapeutic Activity: 8-22 mins  Gissele Narducci OT/L Acute Rehabilitation Department  [phone]  12/17/2023, 11:33 AM

## 2023-12-17 NOTE — Progress Notes (Signed)
 This RN tried to call report x2 with no answer to [facility] [place]. Will attempt once more as time allows.

## 2023-12-17 NOTE — Care Management Important Message (Signed)
 Important Message  Patient Details IM Letter given. Name: Tanner [name] MRN: [id number] Date of Birth: 1945/09/14   Important Message Given:  Yes - Medicare IM     [name] 12/17/2023, 11:18 AM

## 2023-12-17 NOTE — TOC Progression Note (Addendum)
 Transition of Care [facility]) - Progression Note    Patient Details  Name: Tanner [name] MRN: [id number] Date of Birth: 1945-08-12  Transition of Care [facility]) CM/SW Contact  [name], [place] Phone Number: 12/17/2023, 9:32 AM  Clinical Narrative:    Pt's insurance auth for SNF placement was approved Auth ID# [id number]. TOC to follow.  ADDEN 11:30am Pt to d/c to [facility], room 503 RN to call report to [phone]. CSW spoke with pt's son [name] he agreed with d/c plan. Facility is requesting transport to be called around 1:30pm. PTAR arranged TOC sign off.    Expected Discharge Plan: Skilled Nursing Facility Barriers to Discharge: Continued Medical Work up  Expected Discharge Plan and Services       Living arrangements for the past 2 months: Single Family Home                                       Social Determinants of Health (SDOH) Interventions SDOH Screenings   Food Insecurity: No Food Insecurity (12/11/2023)  Housing: Low Risk  (12/11/2023)  Transportation Needs: No Transportation Needs (12/11/2023)  Utilities: Not At Risk (12/11/2023)  Alcohol Screen: Low Risk  (10/27/2022)  Depression (PHQ2-9): High Risk (09/13/2023)  Financial Resource Strain: Low Risk  (10/27/2022)  Physical Activity: Inactive (10/27/2022)  Social Connections: Moderately Integrated (12/11/2023)  Stress: No Stress Concern Present (12/31/2022)  Recent Concern: Stress - Stress Concern Present (10/30/2022)  Tobacco Use: Medium Risk (12/12/2023)    Readmission Risk Interventions    08/20/2022   11:10 AM  Readmission Risk Prevention Plan  Transportation Screening Complete  PCP or Specialist Appt within 3-5 Days Complete  HRI or Home Care Consult Complete  Palliative Care Screening Not Applicable  Medication Review (RN Care Manager) Complete

## 2023-12-17 NOTE — Discharge Summary (Signed)
 Physician Discharge Summary   Patient: Tanner [name] MRN: [id number] DOB: 1945-08-06  Admit date:     12/11/2023  Discharge date: 12/17/23  Discharge Physician: Elgin Lam, MD   PCP: Center, [facility]   Recommendations at discharge:  PCP visit for hospital follow-up  Discharge Diagnoses: Principal Problem:   Acute on chronic diastolic (congestive) heart failure (HCC) Active Problems:   Cellulitis of buttock   Pressure injury of buttock, stage 3 (HCC)  Resolved Problems:   * No resolved hospital problems. [facility] Course: Tanner [name] is a 78 y.o. male with a history of diabetes mellitus type 2, hypertension, hyperlipidemia, obesity, OSA, CHF, DVT, PE, admission-of heart failure, neuropathy, anxiety, depression below, wheelchair-bound.  Patient presented secondary to leg swelling was found to have evidence of acute on chronic diastolic heart failure.  Patient was started on Lasix  IV diuresis.  Patient also found to have evidence of a stage III pressure ulcer with surrounding cellulitis started on antibiotics for management.  Assessment and Plan:  Acute on chronic diastolic heart failure Patient with lower extremity edema and mildly elevated BNP of 427.  Patient with concern for acute on chronic heart failure.  Patient started on Lasix  IV.  Echocardiogram significant for an LVEF of 66 5% with grade 1 diastolic dysfunction.  Weight on admission of 217.81 pounds with no further weights obtained. Lasix  discontinued.  Net -8,304 mL output documented, however intake not accurately documented.   Acute respiratory failure with hypoxia Weaned oxygen  to room air.   Primary hypertension Continue Coreg    Pressure injury, stage III Cellulitis Infected with surrounding cellulitis. Patient managed with ceftriaxone . Completed a 7-day course prior to discharge. Wound care consulted with recommendations (6/29): Dressing procedure/placement/frequency: Cleanse wounds on the buttocks  with saline, pat dry Apply Santyl  to gauze pads and place over wounds, top with ABD pads, secure with tape of mesh underwear.  Low air loss mattress for moisture management and pressure redistribution Gerhardt's to ICD   History of prosthetic joint infection Patient is currently on chronic doxycycline . Continue outpatient doxycycline .   Acute metabolic encephalopathy Transient. Resolved.   Chronic bilateral lower extremity edema History of DVT/PE Venous duplex ordered and negative for DVT. Continue Eliquis .   Atrial fibrillation Continue amiodarone  and Eliquis    Hyperlipidemia Continue Lipitor    Anxiety Depression Continue Wellbutrin    BPH Continue finasteride    GERD Continue Protonix    Migraine headache Noted.   Peripheral neuropathy Continue gabapentin ; dose decreased for renal function. May titrate back up as able based on renal function.   Obesity, class II Estimated body mass index is 37.39 kg/m as calculated from the following:   Height as of this encounter: 5' 4 (1.626 m).   Weight as of this encounter: 98.8 kg.  Consultants:  None   Procedures:  Transthoracic Echocardiogram  Disposition: Skilled nursing facility Diet recommendation: Cardiac diet   DISCHARGE MEDICATION: Allergies as of 12/17/2023       Reactions   Lisinopril Other (See Comments)    Renal impairment   Amlodipine Swelling   Codeine Sulfate Itching, Nausea Only        Medication List     STOP taking these medications    amoxicillin -clavulanate 875-125 MG tablet Commonly known as: AUGMENTIN    diphenhydrAMINE  25 MG tablet Commonly known as: BENADRYL        TAKE these medications    acetaminophen  500 MG tablet Commonly known as: TYLENOL  Take 1,000 mg by mouth every 8 (eight) hours as needed  for moderate pain (pain score 4-6).   amiodarone  200 MG tablet Commonly known as: PACERONE  TAKE 1 TABLET BY MOUTH DAILY What changed: when to take this   ascorbic acid  500 MG  tablet Commonly known as: VITAMIN C  Take 1 tablet (500 mg total) by mouth 2 (two) times daily.   atorvastatin  80 MG tablet Commonly known as: LIPITOR  Take 80 mg by mouth in the morning.   buPROPion  150 MG 12 hr tablet Commonly known as: WELLBUTRIN  SR Take 150 mg by mouth 2 (two) times daily.   carvedilol  6.25 MG tablet Commonly known as: COREG  TAKE 1 TABLET BY MOUTH 2 TIMES DAILY WITH A MEAL.   cetirizine  10 MG tablet Commonly known as: ZYRTEC  Take 1 tablet (10 mg total) by mouth daily.   collagenase  250 UNIT/GM ointment Commonly known as: SANTYL  Apply topically daily for 9 days. Apply to buttock wound Start taking on: December 18, 2023   diclofenac Sodium 1 % Gel Commonly known as: VOLTAREN Apply 2 g topically as needed.   doxycycline  100 MG capsule Commonly known as: VIBRAMYCIN  Take 1 capsule (100 mg total) by mouth 2 (two) times daily. Start doxycycline  AFTER completing the course of linezolid  (Zyvox ) and continue indefinitely   Eliquis  5 MG Tabs tablet Generic drug: apixaban  Take 5 mg by mouth 2 (two) times daily.   finasteride  5 MG tablet Commonly known as: PROSCAR  Take 5 mg by mouth at bedtime.   gabapentin  300 MG capsule Commonly known as: NEURONTIN  Take 1 capsule (300 mg total) by mouth 2 (two) times daily. What changed: when to take this   guaiFENesin  100 MG/5ML liquid Commonly known as: ROBITUSSIN Take 5 mLs by mouth every 4 (four) hours as needed for cough or to loosen phlegm.   Jardiance  10 MG Tabs tablet Generic drug: empagliflozin  Take 5 mg by mouth daily.   lidocaine  2 % solution Commonly known as: XYLOCAINE  USE AS DIRECTED 15 MLS IN THE MOUTH OR THROAT AS NEEDED FOR MOUTH PAIN.   melatonin 5 MG Tabs Take 1 tablet (5 mg total) by mouth at bedtime.   multivitamins ther. w/minerals Tabs tablet Take 1 tablet by mouth every evening.   nortriptyline  10 MG capsule Commonly known as: PAMELOR  Take 20 mg by mouth at bedtime.   nutrition supplement  (JUVEN) Pack Take 1 packet by mouth 2 (two) times daily between meals.   pantoprazole  40 MG tablet Commonly known as: PROTONIX  Take 1 tablet (40 mg total) by mouth daily at 12 noon.   polyethylene glycol 17 g packet Commonly known as: MIRALAX  / GLYCOLAX  Take 17 g by mouth daily. What changed:  when to take this reasons to take this   potassium chloride  10 MEQ tablet Commonly known as: KLOR-CON  Take 10 mEq by mouth every evening.   senna 8.6 MG Tabs tablet Commonly known as: SENOKOT Take 2 tablets (17.2 mg total) by mouth at bedtime. What changed:  when to take this reasons to take this   SUMAtriptan  25 MG tablet Commonly known as: IMITREX  Take 25 mg by mouth every 2 (two) hours as needed for migraine.   torsemide  20 MG tablet Commonly known as: DEMADEX  Take 20 mg by mouth in the morning.   zinc  sulfate (50mg  elemental zinc ) 220 (50 Zn) MG capsule Take 1 capsule (220 mg total) by mouth daily. Start taking on: December 18, 2023        Follow-up Information     Center, [facility]. Schedule an appointment as soon as  possible for a visit in 1 week(s).   Specialty: General Practice Why: For hospital follow-up Contact information: [address] [place] [address] [phone]                Discharge Exam: BP (!) 149/54 (BP Location: Right Arm)   Pulse 81   Temp 99.2 F (37.3 C) (Oral)   Resp 17   Ht 5' 4 (1.626 m)   Wt 98.8 kg   SpO2 93%   BMI 37.39 kg/m   General exam: Appears calm and comfortable Respiratory system: Respiratory effort normal. Central nervous system: Alert and oriented. Psychiatry: Judgement and insight appear normal. Mood & affect appropriate.   Condition at discharge: stable  The results of significant diagnostics from this hospitalization (including imaging, microbiology, ancillary and laboratory) are listed below for reference.   Imaging Studies: VAS US  LOWER EXTREMITY VENOUS (DVT) Result Date: 12/13/2023  Lower Venous  DVT Study Patient Name:  TION TSE  Date of Exam:   12/13/2023 Medical Rec #: [id number]       Accession #:    [id number] Date of Birth: 23-Jul-1945       Patient Gender: M Patient Age:   66 years Exam Location:  [facility] Procedure:      VAS US  LOWER EXTREMITY VENOUS (DVT) Referring Phys: CHAPMAN ROTA --------------------------------------------------------------------------------  Indications: Edema.  Risk Factors: Hx of PE DVT LLE 2018 & 2014. Anticoagulation: Eliquis . Limitations: Poor ultrasound/tissue interface and body habitus. Comparison Study: Previous exam on 03/18/2023 was negative for DVT Performing Technologist: Ezzie POTTERS RVT, RDMS  Examination Guidelines: A complete evaluation includes B-mode imaging, spectral Doppler, color Doppler, and power Doppler as needed of all accessible portions of each vessel. Bilateral testing is considered an integral part of a complete examination. Limited examinations for reoccurring indications may be performed as noted. The reflux portion of the exam is performed with the patient in reverse Trendelenburg.  +---------+---------------+---------+-----------+----------+--------------+ RIGHT    CompressibilityPhasicitySpontaneityPropertiesThrombus Aging +---------+---------------+---------+-----------+----------+--------------+ CFV      Full           Yes      Yes                                 +---------+---------------+---------+-----------+----------+--------------+ SFJ      Full                                                        +---------+---------------+---------+-----------+----------+--------------+ FV Prox  Full           Yes      Yes                                 +---------+---------------+---------+-----------+----------+--------------+ FV Mid   Full           Yes      Yes                                 +---------+---------------+---------+-----------+----------+--------------+ FV DistalFull           Yes       Yes                                 +---------+---------------+---------+-----------+----------+--------------+  PFV      Full                                                        +---------+---------------+---------+-----------+----------+--------------+ POP      Full           Yes      Yes                                 +---------+---------------+---------+-----------+----------+--------------+ PTV      Full                                                        +---------+---------------+---------+-----------+----------+--------------+ PERO     Full                                                        +---------+---------------+---------+-----------+----------+--------------+   +---------+---------------+---------+-----------+----------+-------------------+ LEFT     CompressibilityPhasicitySpontaneityPropertiesThrombus Aging      +---------+---------------+---------+-----------+----------+-------------------+ CFV      Full           Yes      Yes                                      +---------+---------------+---------+-----------+----------+-------------------+ SFJ      Full                                                             +---------+---------------+---------+-----------+----------+-------------------+ FV Prox  Full           Yes      Yes                                      +---------+---------------+---------+-----------+----------+-------------------+ FV Mid   Full           Yes      Yes                                      +---------+---------------+---------+-----------+----------+-------------------+ FV DistalFull           Yes      Yes                  Not well visualized +---------+---------------+---------+-----------+----------+-------------------+ PFV      Full                                                             +---------+---------------+---------+-----------+----------+-------------------+  POP       Full           Yes      Yes                                      +---------+---------------+---------+-----------+----------+-------------------+ PTV      Full                                                             +---------+---------------+---------+-----------+----------+-------------------+ PERO     Full                                                             +---------+---------------+---------+-----------+----------+-------------------+     Summary: BILATERAL: - No evidence of deep vein thrombosis seen in the lower extremities, bilaterally. -No evidence of popliteal cyst, bilaterally.   *See table(s) above for measurements and observations. Electronically signed by Fonda Rim on 12/13/2023 at 4:43:07 PM.    Final    ECHOCARDIOGRAM COMPLETE Result Date: 12/12/2023    ECHOCARDIOGRAM REPORT   Patient Name:   JESTEN CAPPUCCIO Date of Exam: 12/12/2023 Medical Rec #:  [id number]      Height:       64.0 in Accession #:    [id number]     Weight:       217.8 lb Date of Birth:  June 09, 1946      BSA:          2.029 m Patient Age:    78 years       BP:           161/60 mmHg Patient Gender: M              HR:           72 bpm. Exam Location:  Inpatient Procedure: 2D Echo, Cardiac Doppler and Color Doppler (Both Spectral and Color            Flow Doppler were utilized during procedure). Indications:    Dyspnea  History:        Patient has prior history of Echocardiogram examinations, most                 recent 04/07/2023. CHF.  Sonographer:    Benard Stallion Referring Phys: [id number] PROSPER M AMPONSAH IMPRESSIONS  1. Left ventricular ejection fraction, by estimation, is 60 to 65%. The left ventricle has normal function. The left ventricle has no regional wall motion abnormalities. Left ventricular diastolic parameters are consistent with Grade I diastolic dysfunction (impaired relaxation).  2. Right ventricular systolic function is normal. The right ventricular size is normal.  3. Left  atrial size was mildly dilated.  4. The mitral valve is grossly normal. Trivial mitral valve regurgitation. No evidence of mitral stenosis.  5. The aortic valve is tricuspid. Aortic valve regurgitation is mild. No aortic stenosis is present. FINDINGS  Left Ventricle: Left ventricular ejection fraction, by estimation, is 60 to 65%. The left ventricle has normal function. The left ventricle has no regional wall motion  abnormalities. The left ventricular internal cavity size was normal in size. There is  no left ventricular hypertrophy. Left ventricular diastolic parameters are consistent with Grade I diastolic dysfunction (impaired relaxation). Right Ventricle: The right ventricular size is normal. No increase in right ventricular wall thickness. Right ventricular systolic function is normal. Left Atrium: Left atrial size was mildly dilated. Right Atrium: Right atrial size was normal in size. Pericardium: There is no evidence of pericardial effusion. Mitral Valve: The mitral valve is grossly normal. Trivial mitral valve regurgitation. No evidence of mitral valve stenosis. Tricuspid Valve: The tricuspid valve is grossly normal. Tricuspid valve regurgitation is trivial. No evidence of tricuspid stenosis. Aortic Valve: The aortic valve is tricuspid. Aortic valve regurgitation is mild. Aortic regurgitation PHT measures 516 msec. No aortic stenosis is present. Aortic valve mean gradient measures 4.0 mmHg. Aortic valve peak gradient measures 8.6 mmHg. Aortic  valve area, by VTI measures 2.89 cm. Pulmonic Valve: The pulmonic valve was grossly normal. Pulmonic valve regurgitation is not visualized. No evidence of pulmonic stenosis. Aorta: The aortic root and ascending aorta are structurally normal, with no evidence of dilitation. Venous: The right lower pulmonary vein is normal. The inferior vena cava was not well visualized. IAS/Shunts: The atrial septum is grossly normal.  LEFT VENTRICLE PLAX 2D LVIDd:         5.20 cm    Diastology LVIDs:         3.30 cm   LV e' medial:    5.66 cm/s LV PW:         1.00 cm   LV E/e' medial:  11.2 LV IVS:        1.00 cm   LV e' lateral:   7.40 cm/s LVOT diam:     2.30 cm   LV E/e' lateral: 8.6 LV SV:         77 LV SV Index:   38 LVOT Area:     4.15 cm  RIGHT VENTRICLE RV Basal diam:  3.00 cm RV Mid diam:    3.10 cm RV S prime:     14.70 cm/s TAPSE (M-mode): 2.5 cm LEFT ATRIUM             Index        RIGHT ATRIUM           Index LA diam:        4.20 cm 2.07 cm/m   RA Area:     14.80 cm LA Vol (A2C):   68.4 ml 33.72 ml/m  RA Volume:   29.90 ml  14.74 ml/m LA Vol (A4C):   72.4 ml 35.69 ml/m LA Biplane Vol: 77.1 ml 38.01 ml/m  AORTIC VALVE AV Area (Vmax):    2.76 cm AV Area (Vmean):   2.63 cm AV Area (VTI):     2.89 cm AV Vmax:           147.00 cm/s AV Vmean:          97.200 cm/s AV VTI:            0.266 m AV Peak Grad:      8.6 mmHg AV Mean Grad:      4.0 mmHg LVOT Vmax:         97.50 cm/s LVOT Vmean:        61.600 cm/s LVOT VTI:          0.185 m LVOT/AV VTI ratio: 0.70 AI PHT:  516 msec  AORTA Ao Root diam: 3.10 cm Ao Asc diam:  3.10 cm MITRAL VALVE                TRICUSPID VALVE MV Area (PHT): 3.93 cm     TR Peak grad:   32.3 mmHg MV Decel Time: 193 msec     TR Vmax:        284.00 cm/s MV E velocity: 63.40 cm/s MV A velocity: 105.00 cm/s  SHUNTS MV E/A ratio:  0.60         Systemic VTI:  0.18 m                             Systemic Diam: 2.30 cm Darryle Decent MD Electronically signed by Darryle Decent MD Signature Date/Time: 12/12/2023/11:45:42 AM    Final    DG Chest Portable 1 View Result Date: 12/11/2023 CLINICAL DATA:  dyspnea, concern for pulm edema EXAM: PORTABLE CHEST - 1 VIEW COMPARISON:  April 24, 2023 FINDINGS: Streaky bibasilar atelectasis. No focal airspace consolidation, pleural effusion, or pneumothorax. No cardiomegaly. Aortic atherosclerosis. No acute fracture or destructive lesions. Multilevel thoracic osteophytosis. IMPRESSION: No acute cardiopulmonary  abnormality. Electronically Signed   By: Rogelia Myers M.D.   On: 12/11/2023 16:57    Microbiology: Results for orders placed or performed during the hospital encounter of 12/11/23  MRSA Next Gen by PCR, Nasal     Status: None   Collection Time: 12/13/23 12:48 PM   Specimen: Nasal Mucosa; Nasal Swab  Result Value Ref Range Status   MRSA by PCR Next Gen NOT DETECTED NOT DETECTED Final    Comment: (NOTE) The GeneXpert MRSA Assay (FDA approved for NASAL specimens only), is one component of a comprehensive MRSA colonization surveillance program. It is not intended to diagnose MRSA infection nor to guide or monitor treatment for MRSA infections. Test performance is not FDA approved in patients less than 69 years old. Performed at [facility], [address] [place], [place] [address]     Labs: CBC: Recent Labs  Lab 12/11/23 1654 12/12/23 0440 12/13/23 0422  WBC 9.2 9.4 10.0  NEUTROABS 6.8  --  6.5  HGB 11.0* 10.5* 10.1*  HCT 39.0 37.5* 36.2*  MCV 79.6* 81.2 78.9*  PLT 226 222 223   Basic Metabolic Panel: Recent Labs  Lab 12/11/23 1654 12/12/23 0440 12/13/23 0422 12/15/23 0936  NA 138 138 136 137  K 4.6 4.3 3.7 4.0  CL 107 105 102 103  CO2 22 23 26 26   GLUCOSE 72 83 85 117*  BUN 23 25* 29* 32*  CREATININE 1.63* 1.66* 1.89* 1.62*  CALCIUM  8.8* 8.5* 8.2* 8.5*  MG  --  2.0  --   --   PHOS  --  3.8  --   --    Liver Function Tests: Recent Labs  Lab 12/11/23 1654  AST 15  ALT 11  ALKPHOS 86  BILITOT 0.7  PROT 5.7*  ALBUMIN 2.7*    Discharge time spent: 35 minutes.  Signed: Elgin Lam, MD [facility] 12/17/2023

## 2023-12-20 DIAGNOSIS — I4891 Unspecified atrial fibrillation: Secondary | ICD-10-CM | POA: Diagnosis not present

## 2023-12-20 DIAGNOSIS — G909 Disorder of the autonomic nervous system, unspecified: Secondary | ICD-10-CM | POA: Diagnosis not present

## 2023-12-20 DIAGNOSIS — M6281 Muscle weakness (generalized): Secondary | ICD-10-CM | POA: Diagnosis not present

## 2023-12-20 DIAGNOSIS — L89313 Pressure ulcer of right buttock, stage 3: Secondary | ICD-10-CM | POA: Diagnosis not present

## 2023-12-20 DIAGNOSIS — I5032 Chronic diastolic (congestive) heart failure: Secondary | ICD-10-CM | POA: Diagnosis not present

## 2023-12-20 DIAGNOSIS — L89323 Pressure ulcer of left buttock, stage 3: Secondary | ICD-10-CM | POA: Diagnosis not present

## 2023-12-20 DIAGNOSIS — E119 Type 2 diabetes mellitus without complications: Secondary | ICD-10-CM | POA: Diagnosis not present

## 2023-12-20 DIAGNOSIS — R2689 Other abnormalities of gait and mobility: Secondary | ICD-10-CM | POA: Diagnosis not present

## 2023-12-20 DIAGNOSIS — F329 Major depressive disorder, single episode, unspecified: Secondary | ICD-10-CM | POA: Diagnosis not present

## 2023-12-20 DIAGNOSIS — N400 Enlarged prostate without lower urinary tract symptoms: Secondary | ICD-10-CM | POA: Diagnosis not present

## 2023-12-20 DIAGNOSIS — F32A Depression, unspecified: Secondary | ICD-10-CM | POA: Diagnosis not present

## 2023-12-20 DIAGNOSIS — I10 Essential (primary) hypertension: Secondary | ICD-10-CM | POA: Diagnosis not present

## 2023-12-20 DIAGNOSIS — I509 Heart failure, unspecified: Secondary | ICD-10-CM | POA: Diagnosis not present

## 2023-12-21 ENCOUNTER — Encounter (INDEPENDENT_AMBULATORY_CARE_PROVIDER_SITE_OTHER): Payer: Self-pay | Admitting: Family Medicine

## 2023-12-21 ENCOUNTER — Other Ambulatory Visit: Payer: Self-pay

## 2023-12-21 ENCOUNTER — Ambulatory Visit (INDEPENDENT_AMBULATORY_CARE_PROVIDER_SITE_OTHER): Payer: Self-pay | Admitting: Family Medicine

## 2023-12-21 VITALS — BP 122/82 | HR 44 | Ht 70.0 in | Wt 256.2 lb

## 2023-12-21 DIAGNOSIS — M6281 Muscle weakness (generalized): Secondary | ICD-10-CM | POA: Diagnosis not present

## 2023-12-21 DIAGNOSIS — I82409 Acute embolism and thrombosis of unspecified deep veins of unspecified lower extremity: Secondary | ICD-10-CM | POA: Diagnosis not present

## 2023-12-21 DIAGNOSIS — I509 Heart failure, unspecified: Secondary | ICD-10-CM | POA: Diagnosis not present

## 2023-12-21 DIAGNOSIS — I480 Paroxysmal atrial fibrillation: Secondary | ICD-10-CM | POA: Diagnosis not present

## 2023-12-21 DIAGNOSIS — E119 Type 2 diabetes mellitus without complications: Secondary | ICD-10-CM | POA: Diagnosis not present

## 2023-12-21 DIAGNOSIS — I2510 Atherosclerotic heart disease of native coronary artery without angina pectoris: Secondary | ICD-10-CM | POA: Diagnosis not present

## 2023-12-21 DIAGNOSIS — I5031 Acute diastolic (congestive) heart failure: Secondary | ICD-10-CM | POA: Diagnosis not present

## 2023-12-21 DIAGNOSIS — F32A Depression, unspecified: Secondary | ICD-10-CM | POA: Diagnosis not present

## 2023-12-21 DIAGNOSIS — N400 Enlarged prostate without lower urinary tract symptoms: Secondary | ICD-10-CM | POA: Diagnosis not present

## 2023-12-21 DIAGNOSIS — I10 Essential (primary) hypertension: Secondary | ICD-10-CM | POA: Diagnosis not present

## 2023-12-21 DIAGNOSIS — F329 Major depressive disorder, single episode, unspecified: Secondary | ICD-10-CM | POA: Diagnosis not present

## 2023-12-21 DIAGNOSIS — I4891 Unspecified atrial fibrillation: Secondary | ICD-10-CM | POA: Diagnosis not present

## 2023-12-21 DIAGNOSIS — T8450XA Infection and inflammatory reaction due to unspecified internal joint prosthesis, initial encounter: Secondary | ICD-10-CM | POA: Diagnosis not present

## 2023-12-21 DIAGNOSIS — R451 Restlessness and agitation: Secondary | ICD-10-CM | POA: Diagnosis not present

## 2023-12-21 DIAGNOSIS — R001 Bradycardia, unspecified: Secondary | ICD-10-CM

## 2023-12-21 LAB — VITAMIN A: Vitamin A (Retinoic Acid): 33.4 ug/dL (ref 22.0–69.5)

## 2023-12-21 MED ORDER — DILTIAZEM CD 120 MG CAPSULE,EXTENDED RELEASE 24 HR
120.0000 mg | ORAL_CAPSULE | Freq: Every day | ORAL | 1 refills | Status: DC
Start: 2023-12-21 — End: 2024-03-31

## 2023-12-21 MED ORDER — LOSARTAN 50 MG TABLET
50.0000 mg | ORAL_TABLET | Freq: Every day | ORAL | 1 refills | Status: DC
Start: 2023-12-21 — End: 2024-03-31

## 2023-12-21 NOTE — Nursing Note (Signed)
Pt here for 3 week follow up

## 2023-12-21 NOTE — Progress Notes (Signed)
 [facility] PRIMARY CARE  PRIMARY CARE, [facility]  [address] [place] [address]  [phone]     Thomas [name] is a 78 y.o. male who presents today for follow-up of hypertension   The patient reports current adherence to recommended diet is good. The pt is compliant with medicine.    We had switched him from losartan  to hydralazine .  He was developing a cough from the losartan .  He has also had side effects to multiple other classes of antihypertensives including ACE inhibitors and beta-blockers.  However, after he started the hydralazine  he has started experiencing fatigue and leg cramps.  Several days later he started with an increased cough and some other side effects and stopped the medication because he was unable to tolerate it.  He then went back on his losartan  50 mg that he had at home.  He states the cough was not that bad and he is able to tolerate that and would rather take that is in the hydralazine .  He is still continuing his diltiazem  240 mg also.  Blood pressure has been much better controlled at home the lowest has been 100 systolic.  He still is consistently in the 40s and low 50s for his heart rate but is not having any symptoms from tha  REVIEW OF SYSTEMS:  GENERAL: negative for fatigue, significant weight change, sleep disturbance  HEENT:denies visual changes, sore throat, or URI symptoms  RESPIRATORY: No shortness of breath, cough, wheeze  CARDIAC: No chest pains; no palpitations, exertional dyspnea  GI: no N/V/D, no abdominal pain or change in bowel habits  MUSCULOSKELETAL: no myalgias or arthragias, no recent trauma   NEUROLOGIC: no headache, visual changes or mental status changes  GU: no urinary symptoms or CVA tenderness      Allergies[1]  Current Outpatient Medications   Medication Sig    acetaminophen (TYLENOL) 500 mg Oral Tablet Take 1 Tablet (500 mg total) by mouth Every 4 hours as needed for Pain (pt takes tylenol pm)    alfuzosin   (UROXATRAL ) 10 mg Oral Tablet Sustained Release 24 hr Take 1 Tablet (10 mg total) by mouth Once a day    dilTIAZem  (CARDIZEM  CD) 120 mg Oral Capsule, Sust. Release 24 hr Take 1 Capsule (120 mg total) by mouth Daily    ELIQUIS  5 mg Oral Tablet TAKE 1 TABLET TWICE DAILY    finasteride  (PROSCAR ) 5 mg Oral Tablet Take 1 Tablet (5 mg total) by mouth Once a day    flecainide  (TAMBOCOR ) 100 mg Oral Tablet TAKE 1 TABLET TWICE DAILY    furosemide  (LASIX ) 40 mg Oral Tablet TAKE 1 TABLET ONE TIME DAILY    losartan  (COZAAR ) 50 mg Oral Tablet Take 1 Tablet (50 mg total) by mouth Daily    nitroGLYCERIN  (NITROSTAT ) 0.4 mg Sublingual Tablet, Sublingual Place 1 Tablet (0.4 mg total) under the tongue Every 5 minutes as needed for Chest pain    nystatin  (MYCOSTATIN ) 100,000 unit/mL Oral Suspension TAKE 5 MLS BY MOUTH FOUR TIMES A DAY AS NEEDED FOR THRUSH    potassium chloride  (K-DUR) 10 mEq Oral Tab Sust.Rel. Particle/Crystal TAKE 1 TABLET ONE TIME DAILY    rosuvastatin  (CRESTOR ) 10 mg Oral Tablet Take 1 Tablet (10 mg total) by mouth Every evening    SPIRIVA  WITH HANDIHALER 18 mcg Inhalation Capsule, w/Inhalation Device Take 1 Capsule (18 mcg total) by inhalation Once a day     Past Medical History:   Diagnosis Date    Arthritis  Atrial flutter     Cancer (CMS HCC)     Dysuria     Enteritis due to Rotavirus     Gout     Headache     Hemorrhoid     Hypercholesterolemia     Hypertension          Family Medical History:       Problem Relation (Age of Onset)    Cancer Mother    Hypertension (High Blood Pressure) Mother, Father            Social History     Tobacco Use    Smoking status: Former     Types: Cigars    Smokeless tobacco: Current     Types: Chew, Snuff   Substance Use Topics    Alcohol use: Yes     Alcohol/week: 2.0 standard drinks of alcohol     Types: 2 Cans of beer per week     Comment: few days a week        PHYSICAL EXAM:   The patient appears to be in no acute distress.  Vitals:  When I repeated his blood pressure it  was 112/76  Vitals:    12/21/23 0742   BP: 122/82   Pulse: (!) 44   SpO2: 96%   Weight: 116 kg (256 lb 3.2 oz)   Height: 1.778 m (5' 10)   BMI: 36.76      HEENT: Normocephalic, atraumatic, PERRLA, EOMI, oropharyx benign, TM's benign  Respiratory: CTA, no resp distress, equal BS  Heart:  Bradycardic, no murmur, no edema, normal pulses  GI: soft, NT, positive bowel sounds  Extremities: no edema, no calf pain or tenderness  Neuro: AAOx3, CN's intact, DTR's 2/4 throughout    ASSESSMENT:     ICD-10-CM    1. Primary hypertension  I10 Blood pressure is much better controlled now however the patient did have side effects from the hydralazine  in his not willing to take that medication secondary to side effects but he decided to go back on the losartan .  He states that he is able to tolerate the cough that the losartan  was giving him.  Now that his blood pressure is better controlled with the losartan  50 mg I am going to decrease his dose of diltiazem  help improve his heart rate.  He is to monitor his blood pressures very carefully at home      2. Bradycardia  R00.1 He is seems to be pretty consistently in the 40s and even though he is asymptomatic I am going to decrease his dose of diltiazem  220 mg to try to improve his heart rate.  He has never had an issue with rate control with his atrial fibrillation      3. Atrial fibrillation with controlled ventricular rate (CMS HCC)  I48.91 Continue anticoagulation and a lower dose of rate-controlling medication             Follow Up:  Return if symptoms worsen or fail to improve, and routine care.  Orders Placed This Encounter    losartan  (COZAAR ) 50 mg Oral Tablet    dilTIAZem  (CARDIZEM  CD) 120 mg Oral Capsule, Sust. Release 24 hr        Lauraine Plants, DO         [1]   Allergies  Allergen Reactions    Carvedilol   Other Adverse Reaction (Add comment)     decreased heart rate    Lisinopril   Other Adverse Reaction (Add comment)  cough    Hydralazine   Other Adverse Reaction (Add  comment)     Leg cramps, fatigue    Losartan   Other Adverse Reaction (Add comment)     Cough, but able to tolerate

## 2023-12-21 NOTE — Nursing Note (Signed)
 12/21/23 0746   Fall Risk Assessment   Do you feel unsteady when standing or walking? Yes   Do you worry about falling? No   Have you fallen in the past year? No

## 2023-12-22 DIAGNOSIS — R451 Restlessness and agitation: Secondary | ICD-10-CM | POA: Diagnosis not present

## 2023-12-22 DIAGNOSIS — M6281 Muscle weakness (generalized): Secondary | ICD-10-CM | POA: Diagnosis not present

## 2023-12-22 DIAGNOSIS — E119 Type 2 diabetes mellitus without complications: Secondary | ICD-10-CM | POA: Diagnosis not present

## 2023-12-22 DIAGNOSIS — I4891 Unspecified atrial fibrillation: Secondary | ICD-10-CM | POA: Diagnosis not present

## 2023-12-22 DIAGNOSIS — I10 Essential (primary) hypertension: Secondary | ICD-10-CM | POA: Diagnosis not present

## 2023-12-22 DIAGNOSIS — N400 Enlarged prostate without lower urinary tract symptoms: Secondary | ICD-10-CM | POA: Diagnosis not present

## 2023-12-22 DIAGNOSIS — F329 Major depressive disorder, single episode, unspecified: Secondary | ICD-10-CM | POA: Diagnosis not present

## 2023-12-22 DIAGNOSIS — I509 Heart failure, unspecified: Secondary | ICD-10-CM | POA: Diagnosis not present

## 2023-12-23 DIAGNOSIS — M6281 Muscle weakness (generalized): Secondary | ICD-10-CM | POA: Diagnosis not present

## 2023-12-23 DIAGNOSIS — L89323 Pressure ulcer of left buttock, stage 3: Secondary | ICD-10-CM | POA: Diagnosis not present

## 2023-12-23 DIAGNOSIS — F32A Depression, unspecified: Secondary | ICD-10-CM | POA: Diagnosis not present

## 2023-12-23 DIAGNOSIS — I4891 Unspecified atrial fibrillation: Secondary | ICD-10-CM | POA: Diagnosis not present

## 2023-12-23 DIAGNOSIS — R2689 Other abnormalities of gait and mobility: Secondary | ICD-10-CM | POA: Diagnosis not present

## 2023-12-23 DIAGNOSIS — I5032 Chronic diastolic (congestive) heart failure: Secondary | ICD-10-CM | POA: Diagnosis not present

## 2023-12-23 DIAGNOSIS — L89313 Pressure ulcer of right buttock, stage 3: Secondary | ICD-10-CM | POA: Diagnosis not present

## 2023-12-24 DIAGNOSIS — R451 Restlessness and agitation: Secondary | ICD-10-CM | POA: Diagnosis not present

## 2023-12-24 DIAGNOSIS — I10 Essential (primary) hypertension: Secondary | ICD-10-CM | POA: Diagnosis not present

## 2023-12-24 DIAGNOSIS — F329 Major depressive disorder, single episode, unspecified: Secondary | ICD-10-CM | POA: Diagnosis not present

## 2023-12-24 DIAGNOSIS — I509 Heart failure, unspecified: Secondary | ICD-10-CM | POA: Diagnosis not present

## 2023-12-24 DIAGNOSIS — M6281 Muscle weakness (generalized): Secondary | ICD-10-CM | POA: Diagnosis not present

## 2023-12-24 DIAGNOSIS — E119 Type 2 diabetes mellitus without complications: Secondary | ICD-10-CM | POA: Diagnosis not present

## 2023-12-24 DIAGNOSIS — N400 Enlarged prostate without lower urinary tract symptoms: Secondary | ICD-10-CM | POA: Diagnosis not present

## 2023-12-24 DIAGNOSIS — I4891 Unspecified atrial fibrillation: Secondary | ICD-10-CM | POA: Diagnosis not present

## 2023-12-26 DIAGNOSIS — J9601 Acute respiratory failure with hypoxia: Secondary | ICD-10-CM | POA: Diagnosis not present

## 2023-12-27 DIAGNOSIS — E119 Type 2 diabetes mellitus without complications: Secondary | ICD-10-CM | POA: Diagnosis not present

## 2023-12-27 DIAGNOSIS — I4891 Unspecified atrial fibrillation: Secondary | ICD-10-CM | POA: Diagnosis not present

## 2023-12-27 DIAGNOSIS — F329 Major depressive disorder, single episode, unspecified: Secondary | ICD-10-CM | POA: Diagnosis not present

## 2023-12-27 DIAGNOSIS — M6281 Muscle weakness (generalized): Secondary | ICD-10-CM | POA: Diagnosis not present

## 2023-12-27 DIAGNOSIS — I509 Heart failure, unspecified: Secondary | ICD-10-CM | POA: Diagnosis not present

## 2023-12-27 DIAGNOSIS — L89323 Pressure ulcer of left buttock, stage 3: Secondary | ICD-10-CM | POA: Diagnosis not present

## 2023-12-27 DIAGNOSIS — I10 Essential (primary) hypertension: Secondary | ICD-10-CM | POA: Diagnosis not present

## 2023-12-27 DIAGNOSIS — R451 Restlessness and agitation: Secondary | ICD-10-CM | POA: Diagnosis not present

## 2023-12-27 DIAGNOSIS — I5032 Chronic diastolic (congestive) heart failure: Secondary | ICD-10-CM | POA: Diagnosis not present

## 2023-12-27 DIAGNOSIS — N400 Enlarged prostate without lower urinary tract symptoms: Secondary | ICD-10-CM | POA: Diagnosis not present

## 2023-12-27 DIAGNOSIS — R2689 Other abnormalities of gait and mobility: Secondary | ICD-10-CM | POA: Diagnosis not present

## 2023-12-27 DIAGNOSIS — L89313 Pressure ulcer of right buttock, stage 3: Secondary | ICD-10-CM | POA: Diagnosis not present

## 2023-12-27 DIAGNOSIS — F32A Depression, unspecified: Secondary | ICD-10-CM | POA: Diagnosis not present

## 2023-12-29 DIAGNOSIS — I509 Heart failure, unspecified: Secondary | ICD-10-CM | POA: Diagnosis not present

## 2023-12-29 DIAGNOSIS — F329 Major depressive disorder, single episode, unspecified: Secondary | ICD-10-CM | POA: Diagnosis not present

## 2023-12-29 DIAGNOSIS — N400 Enlarged prostate without lower urinary tract symptoms: Secondary | ICD-10-CM | POA: Diagnosis not present

## 2023-12-29 DIAGNOSIS — E119 Type 2 diabetes mellitus without complications: Secondary | ICD-10-CM | POA: Diagnosis not present

## 2023-12-29 DIAGNOSIS — M6281 Muscle weakness (generalized): Secondary | ICD-10-CM | POA: Diagnosis not present

## 2023-12-29 DIAGNOSIS — I4891 Unspecified atrial fibrillation: Secondary | ICD-10-CM | POA: Diagnosis not present

## 2023-12-29 DIAGNOSIS — I10 Essential (primary) hypertension: Secondary | ICD-10-CM | POA: Diagnosis not present

## 2023-12-29 DIAGNOSIS — R451 Restlessness and agitation: Secondary | ICD-10-CM | POA: Diagnosis not present

## 2023-12-30 DIAGNOSIS — M6281 Muscle weakness (generalized): Secondary | ICD-10-CM | POA: Diagnosis not present

## 2023-12-30 DIAGNOSIS — I5032 Chronic diastolic (congestive) heart failure: Secondary | ICD-10-CM | POA: Diagnosis not present

## 2023-12-30 DIAGNOSIS — I4891 Unspecified atrial fibrillation: Secondary | ICD-10-CM | POA: Diagnosis not present

## 2023-12-30 DIAGNOSIS — F32A Depression, unspecified: Secondary | ICD-10-CM | POA: Diagnosis not present

## 2023-12-30 DIAGNOSIS — R2689 Other abnormalities of gait and mobility: Secondary | ICD-10-CM | POA: Diagnosis not present

## 2023-12-30 DIAGNOSIS — L89313 Pressure ulcer of right buttock, stage 3: Secondary | ICD-10-CM | POA: Diagnosis not present

## 2023-12-30 DIAGNOSIS — L89323 Pressure ulcer of left buttock, stage 3: Secondary | ICD-10-CM | POA: Diagnosis not present

## 2023-12-31 DIAGNOSIS — I10 Essential (primary) hypertension: Secondary | ICD-10-CM | POA: Diagnosis not present

## 2023-12-31 DIAGNOSIS — F329 Major depressive disorder, single episode, unspecified: Secondary | ICD-10-CM | POA: Diagnosis not present

## 2023-12-31 DIAGNOSIS — I509 Heart failure, unspecified: Secondary | ICD-10-CM | POA: Diagnosis not present

## 2023-12-31 DIAGNOSIS — R451 Restlessness and agitation: Secondary | ICD-10-CM | POA: Diagnosis not present

## 2023-12-31 DIAGNOSIS — E119 Type 2 diabetes mellitus without complications: Secondary | ICD-10-CM | POA: Diagnosis not present

## 2023-12-31 DIAGNOSIS — I4891 Unspecified atrial fibrillation: Secondary | ICD-10-CM | POA: Diagnosis not present

## 2023-12-31 DIAGNOSIS — M6281 Muscle weakness (generalized): Secondary | ICD-10-CM | POA: Diagnosis not present

## 2023-12-31 DIAGNOSIS — N400 Enlarged prostate without lower urinary tract symptoms: Secondary | ICD-10-CM | POA: Diagnosis not present

## 2024-01-03 DIAGNOSIS — I509 Heart failure, unspecified: Secondary | ICD-10-CM | POA: Diagnosis not present

## 2024-01-03 DIAGNOSIS — E119 Type 2 diabetes mellitus without complications: Secondary | ICD-10-CM | POA: Diagnosis not present

## 2024-01-03 DIAGNOSIS — R2689 Other abnormalities of gait and mobility: Secondary | ICD-10-CM | POA: Diagnosis not present

## 2024-01-03 DIAGNOSIS — N400 Enlarged prostate without lower urinary tract symptoms: Secondary | ICD-10-CM | POA: Diagnosis not present

## 2024-01-03 DIAGNOSIS — L89323 Pressure ulcer of left buttock, stage 3: Secondary | ICD-10-CM | POA: Diagnosis not present

## 2024-01-03 DIAGNOSIS — R451 Restlessness and agitation: Secondary | ICD-10-CM | POA: Diagnosis not present

## 2024-01-03 DIAGNOSIS — M6281 Muscle weakness (generalized): Secondary | ICD-10-CM | POA: Diagnosis not present

## 2024-01-03 DIAGNOSIS — I10 Essential (primary) hypertension: Secondary | ICD-10-CM | POA: Diagnosis not present

## 2024-01-03 DIAGNOSIS — F329 Major depressive disorder, single episode, unspecified: Secondary | ICD-10-CM | POA: Diagnosis not present

## 2024-01-03 DIAGNOSIS — L89313 Pressure ulcer of right buttock, stage 3: Secondary | ICD-10-CM | POA: Diagnosis not present

## 2024-01-03 DIAGNOSIS — F32A Depression, unspecified: Secondary | ICD-10-CM | POA: Diagnosis not present

## 2024-01-03 DIAGNOSIS — I5032 Chronic diastolic (congestive) heart failure: Secondary | ICD-10-CM | POA: Diagnosis not present

## 2024-01-03 DIAGNOSIS — I4891 Unspecified atrial fibrillation: Secondary | ICD-10-CM | POA: Diagnosis not present

## 2024-01-04 ENCOUNTER — Ambulatory Visit: Attending: Cardiology | Admitting: Cardiology

## 2024-01-04 ENCOUNTER — Encounter: Payer: Self-pay | Admitting: Cardiology

## 2024-01-04 VITALS — BP 116/74 | HR 67 | Ht 64.0 in | Wt 211.0 lb

## 2024-01-04 DIAGNOSIS — I509 Heart failure, unspecified: Secondary | ICD-10-CM | POA: Diagnosis not present

## 2024-01-04 DIAGNOSIS — F329 Major depressive disorder, single episode, unspecified: Secondary | ICD-10-CM | POA: Diagnosis not present

## 2024-01-04 DIAGNOSIS — I10 Essential (primary) hypertension: Secondary | ICD-10-CM | POA: Diagnosis not present

## 2024-01-04 DIAGNOSIS — I480 Paroxysmal atrial fibrillation: Secondary | ICD-10-CM | POA: Diagnosis not present

## 2024-01-04 DIAGNOSIS — N400 Enlarged prostate without lower urinary tract symptoms: Secondary | ICD-10-CM | POA: Diagnosis not present

## 2024-01-04 DIAGNOSIS — M6281 Muscle weakness (generalized): Secondary | ICD-10-CM | POA: Diagnosis not present

## 2024-01-04 DIAGNOSIS — I5030 Unspecified diastolic (congestive) heart failure: Secondary | ICD-10-CM | POA: Diagnosis not present

## 2024-01-04 DIAGNOSIS — I4891 Unspecified atrial fibrillation: Secondary | ICD-10-CM | POA: Diagnosis not present

## 2024-01-04 DIAGNOSIS — R451 Restlessness and agitation: Secondary | ICD-10-CM | POA: Diagnosis not present

## 2024-01-04 DIAGNOSIS — E119 Type 2 diabetes mellitus without complications: Secondary | ICD-10-CM | POA: Diagnosis not present

## 2024-01-04 NOTE — Progress Notes (Signed)
 Cardiology Office Note:  .   Date:  01/04/2024  ID:  Tanner [name], DOB 17-Sep-1945, MRN [id number] PCP: [facility]  [facility] Providers Cardiologist:  Alvan Ronal BRAVO, MD (Inactive)     History of Present Illness: .   Tanner [name] is a 78 y.o. male Discussed the use of AI scribe software for clinical note transcription with the patient, who gave verbal consent to proceed.  History of Present Illness Tanner [name] is a 78 year old male with atrial fibrillation who presents for cardiovascular follow-up.  He has a history of atrial fibrillation and underwent DC cardioversion in March 2024, which successfully restored sinus rhythm. He was initially treated with IV amiodarone  and transitioned to oral amiodarone , currently taking 200 mg daily without any reported issues. He occasionally experiences sensations of his heart 'vibrating' or feeling like an 'earthquake,' which resolve spontaneously. His CHADs VASc score is 8.  He has a history of diabetes managed with Jardiance  10 mg daily. He also takes atorvastatin  80 mg daily for cholesterol management, with an LDL of 41 mg/dL. His hemoglobin was 10 g/dL in June 2025, indicating mild anemia. Recent lab results show a creatinine level of 1.6 mg/dL, ALT of 11 U/L, and TSH of 2.7 mIU/L.  He has a history of deep vein thrombosis and pulmonary embolism, currently managed with Eliquis  5 mg twice daily. He has been hospitalized multiple times, including a recent admission where he required home oxygen  due to feeling unwell, but he has not needed oxygen  since. He mentions a past MRSA scare related to his leg.  Socially, he is retired and frequently visits nursing homes.      ROS: Left arm bruise/bleeding. In Wheelchair. SNF  Studies Reviewed: .        Results LABS Hb: 10 (11/2023) Cr: 1.6 (11/2023) ALT: 11 (11/2023) TSH: 2.7 (11/2023) LDL: 41 (11/2023)  DIAGNOSTIC Transesophageal echocardiogram: No evidence  of endocarditis. Normal ventricular function. (03/23/2023) Risk Assessment/Calculations:            Physical Exam:   VS:  BP 116/74   Pulse 67   Ht 5' 4 (1.626 m)   Wt 211 lb (95.7 kg)   SpO2 93%   BMI 36.22 kg/m    Wt Readings from Last 3 Encounters:  01/04/24 211 lb (95.7 kg)  12/12/23 217 lb 13 oz (98.8 kg)  12/03/23 222 lb (100.7 kg)    GEN: Well nourished, well developed in no acute distress NECK: No JVD; No carotid bruits CARDIAC: RRR, no murmurs, no rubs, no gallops RESPIRATORY:  Clear to auscultation without rales, wheezing or rhonchi  ABDOMEN: Soft, non-tender, non-distended EXTREMITIES:  No edema; No deformity   ASSESSMENT AND PLAN: .    Assessment and Plan Assessment & Plan Chronic systolic heart failure with episodes of atrial fibrillation Well-managed with amiodarone  200 mg daily, maintaining sinus rhythm. No recent exacerbations or need for supplemental oxygen , indicating stable heart function. - Continue amiodarone  200 mg daily  Atrial fibrillation Paroxysmal atrial fibrillation managed with amiodarone . Previous DC cardioversion in March 2024 successfully restored sinus rhythm. Chad's VASc score is 8, indicating high risk for thromboembolic events. - Continue Eliquis  5 mg twice daily for stroke prevention  Deep vein thrombosis and pulmonary embolism Managed with Eliquis . No recent thromboembolic events reported. - Continue Eliquis  5 mg twice daily  Diabetes mellitus, type 2 Managed with Jardiance  10 mg daily, providing cardiovascular benefits and reducing risk of heart failure and stroke. -  Continue Jardiance  10 mg daily  Hyperlipidemia Well-controlled with atorvastatin . LDL levels are excellent at 41 mg/dL. - Continue atorvastatin  80 mg daily  Anemia Mild anemia with hemoglobin level of 10 g/dL.         Dispo: 6 mth Tessa  Signed, Oneil Parchment, MD

## 2024-01-04 NOTE — Patient Instructions (Signed)
 Medication Instructions:  The current medical regimen is effective;  continue present plan and medications.  *If you need a refill on your cardiac medications before your next appointment, please call your pharmacy*  Follow-Up: At [facility], you and your health needs are our priority.  As part of our continuing mission to provide you with exceptional heart care, our providers are all part of one team.  This team includes your primary Cardiologist (physician) and Advanced Practice Providers or APPs (Physician Assistants and Nurse Practitioners) who all work together to provide you with the care you need, when you need it.  Your next appointment:   6 month(s)  Provider:   Orren Fabry, PA       We recommend signing up for the patient portal called MyChart.  Sign up information is provided on this After Visit Summary.  MyChart is used to connect with patients for Virtual Visits (Telemedicine).  Patients are able to view lab/test results, encounter notes, upcoming appointments, etc.  Non-urgent messages can be sent to your provider as well.   To learn more about what you can do with MyChart, go to [email or web address].

## 2024-01-05 ENCOUNTER — Other Ambulatory Visit (INDEPENDENT_AMBULATORY_CARE_PROVIDER_SITE_OTHER): Payer: Self-pay

## 2024-01-05 DIAGNOSIS — R451 Restlessness and agitation: Secondary | ICD-10-CM | POA: Diagnosis not present

## 2024-01-05 DIAGNOSIS — I4891 Unspecified atrial fibrillation: Secondary | ICD-10-CM | POA: Diagnosis not present

## 2024-01-05 DIAGNOSIS — I10 Essential (primary) hypertension: Secondary | ICD-10-CM | POA: Diagnosis not present

## 2024-01-05 DIAGNOSIS — M6281 Muscle weakness (generalized): Secondary | ICD-10-CM | POA: Diagnosis not present

## 2024-01-05 DIAGNOSIS — I509 Heart failure, unspecified: Secondary | ICD-10-CM | POA: Diagnosis not present

## 2024-01-05 DIAGNOSIS — E119 Type 2 diabetes mellitus without complications: Secondary | ICD-10-CM | POA: Diagnosis not present

## 2024-01-05 DIAGNOSIS — F329 Major depressive disorder, single episode, unspecified: Secondary | ICD-10-CM | POA: Diagnosis not present

## 2024-01-05 DIAGNOSIS — N400 Enlarged prostate without lower urinary tract symptoms: Secondary | ICD-10-CM | POA: Diagnosis not present

## 2024-01-05 DIAGNOSIS — Z7189 Other specified counseling: Secondary | ICD-10-CM

## 2024-01-05 NOTE — Nursing Note (Signed)
 POPULATION HEALTH    COMPLEX CARE MANAGEMENT    Chronic Care Management - CCM  Status: Enrolled  Effective Dates: 12/02/2022 - present  Responsible Staff: Diontae Route, RN          Patient reports to be doing really well. Patient did see PCP for Hypertension follow up. Patient was placed on  hydralazine  but had bad side effects so he switched back to losartan  . Patients BO has been controled last was 122/ 82. Patients heart rate remains in 40-50's. He is not symptomatic. Denies CP, dizziness, SOB. PCP decreased his cardizem  to 120mg  from 220mg . Patient just started new dose a couple of days ago . Patient will update [facility] on any changes. [facility] informed patient that CCM program is overdue to close. Patient would like to re enroll being that he is having the recent BP and HR issues. [facility] will close than re enroll patient.  Patient has been following a healthy diet. [facility] reviewed Cologuard results . They were Negative follow up in 3 years. No medication refill needs at this time. [facility] will continue to follow patient.     Care Management Tasks Completed:  Chart review completed.  Social determinants of health reviewed and updated as needed.  The following Care plan HTN, Rate Control, and Lifestyle were reviewed/discussed/updated.  Interventions ongoing and updated as needed.  Goals addressed and updated.  Interventions ongoing and updated as needed.  The following Education  on HTN, Heart Rate, and general health were completed.  Self management plan reviewed and updated.  See patient care coordination note.  Barriers to health, care plan, and goals reviewed.  Interventions ongoing and updated as needed.  The following Health maintenance/care gaps r on Cologuard , vaccinations were reviewed and discussed with patient.  Health maintenance/care gaps updated as applicable.  Discussed upcoming appointment dates and times.  Reinforced importance of keeping all provider appointments.  Reinforced the importance of taking all  medications as prescribed.  Medical history, surgical history, hospitalizations, and medications reviewed and updated as applicable.    Ongoing and Updated Assessment Interventions:  Reviewed Medication changes Losartan  50 mg and Cardizem  220mg  to 120mg        Case Manager To Do List for the Next Interaction:  Monthly follow up  F/U BP   F/U HR  F/U General health   F/U Cardizem  changes dropping from 220 mg  to 120 mg     Plan to call patient in ~ one month to reassess and update plan of care.  Instructed patient to call with change in symptoms or as needed prior to next follow up.      Ryan Adler, RN

## 2024-01-10 ENCOUNTER — Encounter (INDEPENDENT_AMBULATORY_CARE_PROVIDER_SITE_OTHER): Payer: Self-pay

## 2024-01-10 ENCOUNTER — Other Ambulatory Visit (INDEPENDENT_AMBULATORY_CARE_PROVIDER_SITE_OTHER): Payer: Self-pay

## 2024-01-10 DIAGNOSIS — Z7189 Other specified counseling: Secondary | ICD-10-CM

## 2024-01-10 NOTE — Nursing Note (Signed)
 Completed Program     [facility] discussed closing care coordination with patient. Patient closed from care coordination services and has no further needs. Patient verbalized understanding and agreement. This [facility] will be removed from Care Team at this time. All contact information was given. Encouraged patient to call clinic with any questions and/or concerns in the future. Ryan Adler, RN 01/10/2024 11:49    Clinic phone number [phone]    Nurse Navigator Phone Number- [phone]    Future Appointments   Date Time Provider Department Center   02/21/2024 11:45 AM Barbie Domino, DO [facility] [place]   03/31/2024  1:40 PM Howard-Diggs, Olivia [facility]ONNA [facility]   04/17/2024  8:40 AM Simmie Edu, MD [facility]   07/12/2024  2:00 PM Annell Route, MD [facility]

## 2024-01-10 NOTE — Nursing Note (Signed)
 Care Coordination Identified Note    DATE: 01/10/2024, 11:52  PATIENT NAME: Thomas [name]  MRN: [id number]  DOB: Jun 22, 1945  PCP: Lauraine Plants, DO  INSURANCE: Payor: [employer] MEDICARE / Plan: [employer] CHOICE PPO / Product Type: PPO /   . The pa  The Patient has been marked as identified at this time for potential enrollment. Chart review completed.  A future outgoing call will be made to patient within 48 hours to discuss Care Coordination and obtain verbal consent if interested.     Ryan Adler, RN  01/10/2024  11:52

## 2024-01-10 NOTE — Nursing Note (Signed)
 POPULATION HEALTH    COMPLEX CARE MANAGEMENT    Chronic Care Management - CCM  Status: Enrolled  Effective Dates: 01/10/2024 - present  Responsible Staff: Bozek, Farran, RN        Patient reports to be doing well. He wanted to re enroll into the Ccm Program . Patient has had recent issue with BP high but heart rate lower. PCP and Cardio consulted and  medications were changed. Patient is okay with current regimen . Last BP Was 122/82. Heart rate 45-50. Patient lives at home alone. He is independent with no assistive devices. Patient is active. He meets friends for Gold every week, attends church two x a week, attends two different senior centers. Patient has many friends and daughter as support. Patient has no issues with medication compliance. He had history of Depression, especially during winter months. Patient was in [facility] program but graduated. Patient did have light therapy which was a success. Patient feels better in the summer and spring months. Patient is up to date on care gaps other than vaccinations which he declines at this time. No other concerns. Patient follow cardiology as well. Patient feels safe where he is living. No acute needs at this time. [facility] will continue to follow patient.     Care Management Tasks Completed:  Chart review completed.  Full intake assessment completed.  Social determinants of health completed.  The following Care plan  on HTN Cardio were initiated.   Goals addressed and updated.  The following education  on HTN, HR , Cardiac general health, and lifestyle were completed.   Self management plan initiated and provided to patient.  See patient care coordination note.  Barriers to health, care plan, and goals identified.  Interventions addressed below as applicable.  Provided patient with direct contact information and 24 hour nurse navigator number.  Provided patient with enrollment pamphlet, business card, and enrollment letter.The following Health maintenance/care gaps  on  vaccinations were reviewed and discussed with patient.  Health maintenance/care gaps updated as applicable.   Discussed upcoming appointment dates and times.  Reinforced importance of keeping all provider appointments.  Reinforced the importance of taking all medications as prescribed.  Medical history, surgical history, hospitalizations, and medications reviewed and updated as applicable.    ASSESSMENT INTERVENTIONS:  SDOH   Lives at home alone . No problems identified in home.     Community Resources   Patient goes to two senior centers for events.     Visual & Hearing   Patient wears glasses     ADLs  Completes ADL's independently without assistive devices.     Life Planning Activities   No current POA or MLW .     Cultural and Linguistic Needs   No current cultural or linguistic needs.     Caregiver Resources:   Patient is close with family, daughter visits weekly at times.     Behavioral Health Status   Patient has history of depression, he was enrolled in [facility]HI. Graduated program. Patient had light therapy [place]. Denies any needs at this time.     Assessment of Benefits:  Healthcare Benefits:Value Based Agreement  Healthcare Benefits Assessment:Assessed and adequate to cover care    Additional Interventions:  Encouraged BP Checks   Encouraged HR Checks   Encouraged Healthy Diet   Encouraged Activity Educate don HTN ,and cardiac s/s   Reviewed general Health     Case Manager To Do List for the Next Interaction:  Monthly outreachF/U BP  F/U HR   Confirmed next PCP appointment in September   F/U Diet activity     Plan to call patient in ~ one month to reassess and update plan of care.  Instructed patient to call with change in symptoms or as needed prior to next follow up.      Ryan Adler, RN    Chronic Care Management Consent  He meets criteria for Chronic Care Management (CCM) services, as he has multiple chronic conditions and is at risk for hospitalization, deterioration of function and/or  exacerbation.    Patient was given information about Chronic Care Management services today including:   CCM service includes personalized support from designated clinical staff supervised by his physician, including individualized plan of care and coordination with other care providers  24/7 contact phone numbers for assistance for urgent and routine care needs.  Service will only be billed when office clinical staff spend 20 minutes or more in a month to coordinate care.  Only one practitioner may furnish and bill the service in a calendar month.  The patient may stop CCM services at any time (effective at the end of the month) by phone call to the office staff.  The patient will be responsible for cost sharing (co-pay) of up to 20% of the service fee (after annual deductible is met).    The patient agrees to Chronic Care Management services.  Continuity Practitioner (for routine appointments): Lauraine Plants, DO

## 2024-01-10 NOTE — Nursing Note (Deleted)
 POPULATION HEALTH    COMPLEX CARE MANAGEMENT    Chronic Care Management - CCM  Status: Enrolled  Effective Dates: 01/10/2024 - present  Responsible Staff: Atanacio Claude, RN          Patient reports:  ***    Care Management Tasks Completed:  Chart review completed.  Social determinants of health reviewed and updated as needed.  The following Care plan reviewed/discussed/updated. *** Interventions ongoing and updated as needed.  Goals addressed and updated.  Interventions ongoing and updated as needed.  The following Education completed.***  Self management plan reviewed and updated.  See patient care coordination note.  Barriers to health, care plan, and goals reviewed.  Interventions ongoing and updated as needed.  The following Health maintenance/care gaps reviewed and discussed with patient. *** Health maintenance/care gaps updated as applicable.  Discussed upcoming appointment dates and times.  Reinforced importance of keeping all provider appointments.  Reinforced the importance of taking all medications as prescribed.  Medical history, surgical history, hospitalizations, and medications reviewed and updated as applicable.    Ongoing and Updated Assessment Interventions:  Encouraged BP Checks   Encouraged HR Checks   Encouraged Healthy Diet   Encouraged Activity Educate don HTN ,and cardiac s/s   Reviewed general Health       Case Manager To Do List for the Next Interaction:  Monthly follow up  F/U BP   F/U HR   Confirmed next PCP appointment in September   F/U Diet activity     Plan to call patient in ~ one month to reassess and update plan of care.  Instructed patient to call with change in symptoms or as needed prior to next follow up.      Claude Atanacio, RN

## 2024-01-11 ENCOUNTER — Telehealth: Payer: Self-pay | Admitting: *Deleted

## 2024-01-11 NOTE — Telephone Encounter (Signed)
 Copied from CRM [id number]. Topic: Clinical - Home Health Verbal Orders >> Jan 10, 2024  4:20 PM Mercer PEDLAR wrote: Caller/Agency: [facility] Callback Number: [phone] Service Requested: Occupational Therapy Frequency: 1 week 4, phone call the following week, 1 week 2.  Any new concerns about the patient? Yes Patient had a fall on 01/07/2024.

## 2024-01-11 NOTE — Telephone Encounter (Signed)
 Not a patient at our office [facility].

## 2024-01-11 NOTE — Telephone Encounter (Signed)
 Copied from CRM [id number]. Topic: Clinical - Home Health Verbal Orders >> Jan 11, 2024  4:18 PM Miquel SAILOR wrote: Caller/Agency: Leita from [place] Number: [phone] Service Requested: Physical Therapy Frequency: 2week 3 1 week 5  Any new concerns about the patient? No

## 2024-01-11 NOTE — Telephone Encounter (Signed)
 Return call to Leita PT - informed pt is not a pt at [facility]. Telephone # for Dr Rollene, family medicine, given to her.

## 2024-01-12 ENCOUNTER — Other Ambulatory Visit (INDEPENDENT_AMBULATORY_CARE_PROVIDER_SITE_OTHER): Payer: Self-pay | Admitting: Family Medicine

## 2024-01-12 NOTE — Telephone Encounter (Signed)
 Last Visit:12/21/2023     Upcoming appointments: 02/21/2024           Nat Lime, MA  01/12/2024, 11:29

## 2024-01-13 ENCOUNTER — Other Ambulatory Visit (INDEPENDENT_AMBULATORY_CARE_PROVIDER_SITE_OTHER): Payer: Self-pay

## 2024-01-13 DIAGNOSIS — Z7189 Other specified counseling: Secondary | ICD-10-CM

## 2024-01-13 DIAGNOSIS — I10 Essential (primary) hypertension: Secondary | ICD-10-CM

## 2024-01-13 DIAGNOSIS — F338 Other recurrent depressive disorders: Secondary | ICD-10-CM

## 2024-01-13 DIAGNOSIS — E782 Mixed hyperlipidemia: Secondary | ICD-10-CM

## 2024-01-13 NOTE — Progress Notes (Signed)
 Dr. .Lauraine Plants, DO      This patient has met the requirements to bill for COMPLEX Chronic Care Management this month.     Complex CCM attestation    I supervised and collaborated with the nurse providing chronic care management services for Elsie FORBES Potters who met moderate-high complexity medical decision-making due to modifications of the care plan or treatment plan/medications during this calendar month.    Lauraine Plants, DO   Chronic Care Management Time Documentation on 01/13/2024 11:12.   Time spent during current encounter is 1 minutes.   Cumulative time during current month's episode (month-to-date) is 115 minutes.          ICD-10-CM    1. Encounter for counseling for care management of patient with chronic conditions and complex health needs using nurse-based model  Z71.89       2. Essential hypertension  I10       3. Seasonal depression (CMS HCC)  F33.8       4. Mixed hyperlipidemia  E78.2             acetaminophen (TYLENOL) 500 mg Oral Tablet, Take 1 Tablet (500 mg total) by mouth Every 4 hours as needed for Pain (pt takes tylenol pm)  alfuzosin  (UROXATRAL ) 10 mg Oral Tablet Sustained Release 24 hr, Take 1 Tablet (10 mg total) by mouth Once a day  dilTIAZem  (CARDIZEM  CD) 120 mg Oral Capsule, Sust. Release 24 hr, Take 1 Capsule (120 mg total) by mouth Daily  ELIQUIS  5 mg Oral Tablet, TAKE 1 TABLET TWICE DAILY  finasteride  (PROSCAR ) 5 mg Oral Tablet, Take 1 Tablet (5 mg total) by mouth Once a day  flecainide  (TAMBOCOR ) 100 mg Oral Tablet, TAKE 1 TABLET TWICE DAILY  furosemide  (LASIX ) 40 mg Oral Tablet, TAKE 1 TABLET ONE TIME DAILY  losartan  (COZAAR ) 50 mg Oral Tablet, Take 1 Tablet (50 mg total) by mouth Daily  nitroGLYCERIN  (NITROSTAT ) 0.4 mg Sublingual Tablet, Sublingual, Place 1 Tablet (0.4 mg total) under the tongue Every 5 minutes as needed for Chest pain  nystatin  (MYCOSTATIN ) 100,000 unit/mL Oral Suspension, TAKE 5 MLS BY MOUTH FOUR TIMES A DAY AS NEEDED FOR THRUSH  potassium chloride  (K-DUR) 10 mEq  Oral Tab Sust.Rel. Particle/Crystal, TAKE 1 TABLET ONE TIME DAILY  rosuvastatin  (CRESTOR ) 10 mg Oral Tablet, Take 1 Tablet (10 mg total) by mouth Every evening  SPIRIVA  WITH HANDIHALER 18 mcg Inhalation Capsule, w/Inhalation Device, Take 1 Capsule (18 mcg total) by inhalation Once a day    No facility-administered medications prior to visit.      Thanks,  Your Chronic Care Management Team

## 2024-01-14 NOTE — Telephone Encounter (Signed)
 RTC call to Concepc[place], OT  [facility].  Patient is not a patient of the [facility].  His PCP is Dr. Almarie Cleveland.  Number was given in message.

## 2024-01-14 NOTE — Telephone Encounter (Unsigned)
 Copied from CRM [id number]. Topic: Clinical - Home Health Verbal Orders >> Jan 14, 2024  9:03 AM Miquel SAILOR wrote: Caller/Agency:Yushoria from  [facility]  Callback Number: [phone] Service Requested: Occupational Therapy Frequency: 1 week for 4/1 week 2 Also Report PT fell 07/25 but is ok no injury's Any new concerns about the patient? No

## 2024-01-14 NOTE — Telephone Encounter (Signed)
 Pt has appt for hospital f/u on 8/11

## 2024-01-14 NOTE — Telephone Encounter (Signed)
 Would need hospital follow up for orders

## 2024-01-24 ENCOUNTER — Ambulatory Visit: Admitting: Internal Medicine

## 2024-01-24 ENCOUNTER — Encounter: Payer: Self-pay | Admitting: Internal Medicine

## 2024-01-24 VITALS — BP 110/70 | HR 67 | Temp 97.9°F | Ht 64.0 in | Wt 213.0 lb

## 2024-01-24 DIAGNOSIS — N1832 Chronic kidney disease, stage 3b: Secondary | ICD-10-CM | POA: Diagnosis not present

## 2024-01-24 DIAGNOSIS — E1143 Type 2 diabetes mellitus with diabetic autonomic (poly)neuropathy: Secondary | ICD-10-CM | POA: Diagnosis not present

## 2024-01-24 DIAGNOSIS — I5022 Chronic systolic (congestive) heart failure: Secondary | ICD-10-CM

## 2024-01-24 DIAGNOSIS — L02415 Cutaneous abscess of right lower limb: Secondary | ICD-10-CM

## 2024-01-24 DIAGNOSIS — L89303 Pressure ulcer of unspecified buttock, stage 3: Secondary | ICD-10-CM

## 2024-01-24 DIAGNOSIS — L03115 Cellulitis of right lower limb: Secondary | ICD-10-CM | POA: Diagnosis not present

## 2024-01-24 DIAGNOSIS — T8453XD Infection and inflammatory reaction due to internal right knee prosthesis, subsequent encounter: Secondary | ICD-10-CM

## 2024-01-24 LAB — COMPREHENSIVE METABOLIC PANEL WITH GFR
ALT: 19 U/L (ref 0–53)
AST: 19 U/L (ref 0–37)
Albumin: 3.3 g/dL — ABNORMAL LOW (ref 3.5–5.2)
Alkaline Phosphatase: 98 U/L (ref 39–117)
BUN: 29 mg/dL — ABNORMAL HIGH (ref 6–23)
CO2: 29 meq/L (ref 19–32)
Calcium: 8.5 mg/dL (ref 8.4–10.5)
Chloride: 104 meq/L (ref 96–112)
Creatinine, Ser: 2.07 mg/dL — ABNORMAL HIGH (ref 0.40–1.50)
GFR: 29.96 mL/min — ABNORMAL LOW (ref >60.00)
Glucose, Bld: 101 mg/dL — ABNORMAL HIGH (ref 70–99)
Potassium: 3.8 meq/L (ref 3.5–5.1)
Sodium: 141 meq/L (ref 135–145)
Total Bilirubin: 0.4 mg/dL (ref 0.2–1.2)
Total Protein: 5.5 g/dL — ABNORMAL LOW (ref 6.0–8.3)

## 2024-01-24 LAB — LIPID PANEL
Cholesterol: 83 mg/dL (ref 0–200)
HDL: 40.4 mg/dL (ref >39.00)
LDL Cholesterol: 29 mg/dL (ref 0–99)
NonHDL: 42.16
Total CHOL/HDL Ratio: 2
Triglycerides: 66 mg/dL (ref 0.0–149.0)
VLDL: 13.2 mg/dL (ref 0.0–40.0)

## 2024-01-24 LAB — HEMOGLOBIN A1C: Hgb A1c MFr Bld: 5.6 % (ref 4.6–6.5)

## 2024-01-24 LAB — MICROALBUMIN / CREATININE URINE RATIO
Creatinine,U: 66.3 mg/dL
Microalb Creat Ratio: 41.8 mg/g — ABNORMAL HIGH (ref 0.0–30.0)
Microalb, Ur: 2.8 mg/dL — ABNORMAL HIGH (ref 0.0–1.9)

## 2024-01-24 LAB — CBC
HCT: 31.3 % — ABNORMAL LOW (ref 39.0–52.0)
Hemoglobin: 9.7 g/dL — ABNORMAL LOW (ref 13.0–17.0)
MCHC: 31 g/dL (ref 30.0–36.0)
MCV: 75.7 fl — ABNORMAL LOW (ref 78.0–100.0)
Platelets: 184 K/uL (ref 150.0–400.0)
RBC: 4.13 Mil/uL — ABNORMAL LOW (ref 4.22–5.81)
RDW: 19.5 % — ABNORMAL HIGH (ref 11.5–15.5)
WBC: 7.8 K/uL (ref 4.0–10.5)

## 2024-01-24 NOTE — Patient Instructions (Signed)
 We will make sure the home health continues.

## 2024-01-24 NOTE — Progress Notes (Signed)
 Subjective:   Patient ID: Tanner [name], male    DOB: [birth year], 78 y.o.   MRN: [id number]  Discussed the use of AI scribe software for clinical note transcription with the patient, who gave verbal consent to proceed.  History of Present Illness Timon Geissinger is a 78 year old male with MRSA infection in his knee replacement who presents with weakness and difficulty walking.  He was hospitalized from the end of June to the beginning of July, during which he received intravenous fluids and oxygen  therapy. After discharge, he spent time at [facility] for rehabilitation and returned home approximately two to three weeks ago. Since returning home, he has experienced persistent weakness and is concerned about his ability to walk. He is currently using a walker but is frustrated with his mobility limitations and desires to walk without assistance.  He has a history of MRSA infection in his knee replacement, for which he underwent a procedure involving drainage and component exchange. He is currently on doxycycline  capsules twice daily to manage the infection. The infection has been working its way around to the front of his leg.  He receives home health services, including weekly visits from a nurse for bandaging and physical therapy. He also participates in occupational therapy, which he finds beneficial for improving functionality. He has difficulty getting up after a fall due to weakness and uses an air mattress to assist him in getting up from the floor.  No recent breathing issues and no use of oxygen  since returning home. He recently saw a cardiologist who was satisfied with his condition. He experiences occasional constipation, which is managed with medication. He notes swelling in his legs, particularly in one leg, which remains larger than the other. He has some scratches from bandaging but no significant wounds or sores on his feet.  He has a wound on his backside that is not yet  closed and recently started bleeding, but he managed it with a patch and reports no further bleeding. Sitting is uncomfortable due to the wound, and he uses gel cushions for relief. Prolonged sitting, especially on the toilet, causes numbness in his feet.   Review of Systems  Constitutional:  Positive for activity change and fatigue.  HENT: Negative.    Eyes: Negative.   Respiratory:  Negative for cough, chest tightness and shortness of breath.   Cardiovascular:  Negative for chest pain, palpitations and leg swelling.  Gastrointestinal:  Negative for abdominal distention, abdominal pain, constipation, diarrhea, nausea and vomiting.  Musculoskeletal:  Positive for arthralgias and gait problem.  Skin: Negative.   Neurological:  Positive for weakness.  Psychiatric/Behavioral: Negative.      Objective:  Physical Exam Constitutional:      Appearance: He is well-developed.  HENT:     Head: Normocephalic and atraumatic.  Cardiovascular:     Rate and Rhythm: Normal rate and regular rhythm.  Pulmonary:     Effort: Pulmonary effort is normal. No respiratory distress.     Breath sounds: Normal breath sounds. No wheezing or rales.  Abdominal:     General: Bowel sounds are normal. There is no distension.     Palpations: Abdomen is soft.     Tenderness: There is no abdominal tenderness. There is no rebound.  Musculoskeletal:        General: Tenderness present.     Cervical back: Normal range of motion.     Right lower leg: Edema present.     Left lower leg: Edema  present.  Skin:    General: Skin is warm and dry.  Neurological:     Mental Status: He is alert and oriented to person, place, and time.     Coordination: Coordination normal.     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 Doxycycline  treatment continues, offering effectiveness with a lower risk of long-term side effects. Continue doxycycline  as prescribed and monitor for gastrointestinal side effects.  Impaired mobility due to knee prosthesis infection   Mobility is impaired due to the chronic MRSA infection in the right knee prosthesis. Physical and occupational therapy are ongoing to improve mobility and strength. The potential use of assistive devices has been discussed. Continue physical and occupational therapy. Contact home health services to confirm continuation of therapy.  Chronic wound of posterior leg   A chronic infection involving knee prosthesis Ongoing wound care includes weekly nurse visits. Continue weekly nurse visits for wound care and monitor the wound for signs of infection or further bleeding. Continue doxycycline  indefinitely.   Bilateral lower extremity edema   Bilateral lower extremity edema remains stable. Continue current management strategies.  Constipation   Intermittent constipation is managed with medication, with no new gastrointestinal symptoms. Continue current constipation management.

## 2024-01-25 ENCOUNTER — Ambulatory Visit: Payer: Self-pay | Admitting: Internal Medicine

## 2024-01-26 DIAGNOSIS — J9601 Acute respiratory failure with hypoxia: Secondary | ICD-10-CM | POA: Diagnosis not present

## 2024-01-27 ENCOUNTER — Encounter: Payer: Self-pay | Admitting: Internal Medicine

## 2024-01-27 NOTE — Assessment & Plan Note (Signed)
 Continue doxycycline  indefinitely for suppression. Discussed at length that this infection is likely not able to be cured but rather controlled. He is not a good candidate for further knee surgeries.

## 2024-01-27 NOTE — Assessment & Plan Note (Signed)
 Is on doxycycline  for suppression and will continue indefinitely.

## 2024-01-27 NOTE — Assessment & Plan Note (Signed)
 Checking HgA1c, lipid panel and UACR adjust as needed.

## 2024-01-27 NOTE — Assessment & Plan Note (Signed)
 Improving and weekly wound visits through home health to continue.

## 2024-01-27 NOTE — Assessment & Plan Note (Signed)
Checking CMP for stability. Adjust as needed. 

## 2024-01-27 NOTE — Assessment & Plan Note (Signed)
 No signs of flare today. Checking CMP and adjust torsemide  dosing as needed.

## 2024-02-03 ENCOUNTER — Telehealth: Payer: Self-pay

## 2024-02-03 NOTE — Telephone Encounter (Signed)
 Home Health Certification  [facility] Orders fax back successfully

## 2024-02-06 ENCOUNTER — Other Ambulatory Visit (HOSPITAL_COMMUNITY): Payer: Self-pay | Admitting: Student in an Organized Health Care Education/Training Program

## 2024-02-06 ENCOUNTER — Other Ambulatory Visit (INDEPENDENT_AMBULATORY_CARE_PROVIDER_SITE_OTHER): Payer: Self-pay | Admitting: Family Medicine

## 2024-02-07 NOTE — Telephone Encounter (Signed)
 Last Visit:12/21/2023     Upcoming appointments: 02/21/2024           Nat Lime, MA  02/07/2024, 07:40

## 2024-02-08 ENCOUNTER — Telehealth: Payer: Self-pay

## 2024-02-08 ENCOUNTER — Other Ambulatory Visit (INDEPENDENT_AMBULATORY_CARE_PROVIDER_SITE_OTHER): Payer: Self-pay

## 2024-02-08 DIAGNOSIS — Z7189 Other specified counseling: Secondary | ICD-10-CM

## 2024-02-08 NOTE — Telephone Encounter (Signed)
 [facility] Forms received 02/03/2024 and they have been fax back successfully on 02/04/2024

## 2024-02-08 NOTE — Telephone Encounter (Signed)
 Copied from CRM [id number]. Topic: General - Other >> Feb 08, 2024  3:02 PM Robinson H wrote: Reason for CRM: [facility] calling to check status of orders for add on discipline and home health plan of care was faxed to office on 7/29  [facility] [phone]

## 2024-02-08 NOTE — Telephone Encounter (Signed)
 I have fax this over again today both orders for add on discipline home health plan and fax went through successfully

## 2024-02-08 NOTE — Nursing Note (Signed)
 POPULATION HEALTH    DISEASE MANAGEMENT COORDINATOR    [facility] called patient for monthly follow up . Patient did not answer. [facility] left voicemail with contact information.       Ryan Adler, RN

## 2024-02-09 NOTE — Telephone Encounter (Signed)
 Home Health orders and add on discipline orders have been fax back successfully as of 02/08/2024

## 2024-02-11 ENCOUNTER — Other Ambulatory Visit (INDEPENDENT_AMBULATORY_CARE_PROVIDER_SITE_OTHER): Payer: Self-pay

## 2024-02-11 DIAGNOSIS — I10 Essential (primary) hypertension: Secondary | ICD-10-CM

## 2024-02-11 DIAGNOSIS — E782 Mixed hyperlipidemia: Secondary | ICD-10-CM

## 2024-02-11 DIAGNOSIS — Z7189 Other specified counseling: Secondary | ICD-10-CM

## 2024-02-11 NOTE — Progress Notes (Signed)
 Dr. Lauraine Plants, DO  ,    This patient does not meet the requirements to bill for Chronic Care Management services this month. 19 minutes or less was spent coordinating care for the patient. Please sign this encounter as you typically would any other encounter. There is NO SPECIAL ATTESTATION needed . Please let us  know if you have any questions at all.    Chronic Care Management Time Documentation on 02/11/2024 14:56.   Time spent during current encounter is 1 minutes.   Cumulative time during current month's episode (month-to-date) is 10 minutes.          ICD-10-CM    1. Encounter for counseling for care management of patient with chronic conditions and complex health needs using nurse-based model  Z71.89       2. Essential hypertension  I10       3. Mixed hyperlipidemia  E78.2             acetaminophen (TYLENOL) 500 mg Oral Tablet, Take 1 Tablet (500 mg total) by mouth Every 4 hours as needed for Pain (pt takes tylenol pm)  alfuzosin  (UROXATRAL ) 10 mg Oral Tablet Sustained Release 24 hr, Take 1 Tablet (10 mg total) by mouth Once a day  dilTIAZem  (CARDIZEM  CD) 120 mg Oral Capsule, Sust. Release 24 hr, Take 1 Capsule (120 mg total) by mouth Daily  ELIQUIS  5 mg Oral Tablet, TAKE 1 TABLET TWICE DAILY  finasteride  (PROSCAR ) 5 mg Oral Tablet, Take 1 Tablet (5 mg total) by mouth Once a day  flecainide  (TAMBOCOR ) 100 mg Oral Tablet, TAKE 1 TABLET TWICE DAILY  furosemide  (LASIX ) 40 mg Oral Tablet, TAKE 1 TABLET EVERY DAY  losartan  (COZAAR ) 50 mg Oral Tablet, Take 1 Tablet (50 mg total) by mouth Daily  nitroGLYCERIN  (NITROSTAT ) 0.4 mg Sublingual Tablet, Sublingual, Place 1 Tablet (0.4 mg total) under the tongue Every 5 minutes as needed for Chest pain  nystatin  (MYCOSTATIN ) 100,000 unit/mL Oral Suspension, TAKE 5 MLS BY MOUTH FOUR TIMES A DAY AS NEEDED FOR THRUSH  potassium chloride  (K-DUR) 10 mEq Oral Tab Sust.Rel. Particle/Crystal, TAKE 1 TABLET ONE TIME DAILY  rosuvastatin  (CRESTOR ) 10 mg Oral Tablet, Take 1 Tablet (10  mg total) by mouth Every evening  SPIRIVA  WITH HANDIHALER 18 mcg Inhalation Capsule, w/Inhalation Device, INHALE THE CONTENTS OF 1 CAPSULE EVERY DAY    No facility-administered medications prior to visit.        Thanks,  Your Chronic Care Management Team

## 2024-02-16 ENCOUNTER — Ambulatory Visit: Attending: Family Medicine

## 2024-02-16 ENCOUNTER — Other Ambulatory Visit: Payer: Self-pay

## 2024-02-16 DIAGNOSIS — I10 Essential (primary) hypertension: Secondary | ICD-10-CM | POA: Insufficient documentation

## 2024-02-16 DIAGNOSIS — Z23 Encounter for immunization: Secondary | ICD-10-CM | POA: Insufficient documentation

## 2024-02-16 LAB — AST (SGOT): AST (SGOT): 18 U/L (ref 11–34)

## 2024-02-16 LAB — CBC
HCT: 46.5 % (ref 38.9–52.0)
HGB: 15.2 g/dL (ref 13.4–17.5)
MCH: 31.1 pg (ref 26.0–32.0)
MCHC: 32.7 g/dL (ref 31.0–35.5)
MCV: 95.3 fL (ref 78.0–100.0)
MPV: 10.6 fL (ref 8.7–12.5)
PLATELETS: 141 x10ˆ3/uL — ABNORMAL LOW (ref 150–400)
RBC: 4.88 x10ˆ6/uL (ref 4.50–6.10)
RDW-CV: 12.6 % (ref 11.5–15.5)
WBC: 6.5 x10ˆ3/uL (ref 3.7–11.0)

## 2024-02-16 LAB — LIPID PANEL
CHOL/HDL RATIO: 2.2
CHOLESTEROL: 135 mg/dL (ref 100–200)
HDL CHOL: 62 mg/dL (ref >=50)
LDL CALC: 61 mg/dL (ref <100)
NON-HDL: 73 mg/dL (ref <=190)
TRIGLYCERIDES: 58 mg/dL (ref <150)
VLDL CALC: 8 mg/dL (ref <30)

## 2024-02-16 LAB — BASIC METABOLIC PANEL, FASTING
ANION GAP: 5 mmol/L (ref 4–13)
BUN/CREA RATIO: 21 (ref 6–22)
BUN: 18 mg/dL (ref 8–25)
CALCIUM: 8.6 mg/dL (ref 8.6–10.3)
CHLORIDE: 107 mmol/L (ref 96–111)
CO2 TOTAL: 32 mmol/L — ABNORMAL HIGH (ref 23–31)
CREATININE: 0.87 mg/dL (ref 0.75–1.35)
GLUCOSE: 91 mg/dL (ref 70–99)
POTASSIUM: 3.9 mmol/L (ref 3.5–5.1)
SODIUM: 144 mmol/L (ref 136–145)
eGFRcr - MALE: 88 mL/min/1.73mˆ2 (ref >=60)

## 2024-02-16 LAB — ALT (SGPT): ALT (SGPT): 17 U/L (ref <43)

## 2024-02-18 ENCOUNTER — Telehealth: Payer: Self-pay

## 2024-02-21 ENCOUNTER — Encounter (INDEPENDENT_AMBULATORY_CARE_PROVIDER_SITE_OTHER): Payer: Self-pay | Admitting: Family Medicine

## 2024-02-21 ENCOUNTER — Other Ambulatory Visit: Payer: Self-pay

## 2024-02-21 ENCOUNTER — Ambulatory Visit (INDEPENDENT_AMBULATORY_CARE_PROVIDER_SITE_OTHER): Payer: Self-pay | Admitting: Family Medicine

## 2024-02-21 VITALS — BP 135/75 | HR 48 | Temp 97.8°F | Ht 70.0 in | Wt 240.0 lb

## 2024-02-21 DIAGNOSIS — N400 Enlarged prostate without lower urinary tract symptoms: Secondary | ICD-10-CM

## 2024-02-21 DIAGNOSIS — R001 Bradycardia, unspecified: Secondary | ICD-10-CM

## 2024-02-21 DIAGNOSIS — J449 Chronic obstructive pulmonary disease, unspecified: Secondary | ICD-10-CM

## 2024-02-21 DIAGNOSIS — F338 Other recurrent depressive disorders: Secondary | ICD-10-CM

## 2024-02-21 DIAGNOSIS — I10 Essential (primary) hypertension: Secondary | ICD-10-CM

## 2024-02-21 DIAGNOSIS — R600 Localized edema: Secondary | ICD-10-CM

## 2024-02-21 DIAGNOSIS — I4892 Unspecified atrial flutter: Secondary | ICD-10-CM

## 2024-02-21 DIAGNOSIS — N401 Enlarged prostate with lower urinary tract symptoms: Secondary | ICD-10-CM

## 2024-02-21 MED ORDER — METHYLPREDNISOLONE 4 MG TABLETS IN A DOSE PACK: ORAL_TABLET | ORAL | 0 refills | Status: DC

## 2024-02-21 NOTE — Telephone Encounter (Signed)
 Forms sent back successfully

## 2024-02-21 NOTE — Progress Notes (Signed)
 [facility] PRIMARY CARE  PRIMARY CARE, [facility]  [address] [place] [address]  [phone]     Thomas [name] is a 78 year old male who presents today for a six-month follow-up of hypertension and chronic medical problems.  The patient reports current adherence to recommended diet is good. The pt is compliant with medicine.  Regarding HTN:  Since his last regular office visit he did have a couple of visits for blood pressure issues.  He was asking to be taken off of losartan  because he felt that he was developing a cough, we then put him on hydralazine .  He then started experiencing side effects such as fatigue and cramps from the hydralazine  so he decided to go back on his losartan  50 mg.  Also his heart rate was still consistently low in the 40s so we decreased his dose of diltiazem  CD to 120 mg.  We just made these changes about 2 months ago.  Blood pressure has been well controlled with systolic numbers in the 120s and 130s, rarely over 140.  He is able to tolerate the losartan .  He now thinks his cough may has been just secondary to allergies.  Now, his heart rate has been 45-55 at home now. (Up 8-10 beats per minute on average since we decrease dose diltiazem .)  He does have an appointment coming up with Cardiology in October.  He feels fine has no symptoms of lightheadedness, dizziness or near-syncope.    Regarding lipid treatment: He is taking Crestor  10 mg and is having no side effects.  Lab Results   Component Value Date    CHOLESTEROL 135 02/16/2024    HDLCHOL 62 02/16/2024    LDLCHOL 61 02/16/2024    TRIG 58 02/16/2024      He does have a history of atrial flutter and has follow-up with cardiology.  He continues on his Eliquis  as his anticoagulant, and a calcium channel blocker for rate control.(as above).   He denies having any bleeding.  He denies any palpitations, lightheadedness, dizziness or chest pain.   he does have a history of chronic obstructive  pulmonary disease and follows with pulmonology.  He continues on Spiriva .  He has not having any pulmonary symptoms at this time.  He does have history of lower extremity edema and continues to take Lasix  and potassium.  This does help control his swelling.  Kidney function has been excellent  He does follow with urology for history of BPH with lower urinary lower urinary symptoms.  He does continue to follow up with Urology and continues on both Uroxatral  and Proscar     Most recent Cologuard 11/30/2023, negative    REVIEW OF SYSTEMS:  GENERAL: negative for fevers/chills, fatigue, significant weight change, sleep disturbance  HEENT:denies visual changes, sore throat or URI symptoms  RESPIRATORY:  Currently denying shortness of breath, cough or wheezing  CARDIAC:  Negative for chest pain or palpitations  GI: no nausea/vomitting/diarrhea, no abdominal pain  MUSCULOSKELETAL: no myalgias or arthragias, no recent trauma  NEUROLOGIC: no headache, visual changes or mental status changes  GU: no urinary symptoms or CVA tenderness      Allergies   Allergen Reactions    Carvedilol   Other Adverse Reaction (Add comment)     decreased heart rate    Lisinopril   Other Adverse Reaction (Add comment)     cough    Flomax  [Tamsulosin ] Nausea/ Vomiting    Hydralazine   Other Adverse Reaction (Add comment)     Leg  cramps, fatigue    Losartan   Other Adverse Reaction (Add comment)     Cough, but able to tolerate       Current Outpatient Medications   Medication Sig    acetaminophen (TYLENOL) 500 mg Oral Tablet Take 1 Tablet (500 mg total) by mouth Every 4 hours as needed for Pain (pt takes tylenol pm)    alfuzosin  (UROXATRAL ) 10 mg Oral Tablet Sustained Release 24 hr Take 1 Tablet (10 mg total) by mouth Once a day    dilTIAZem  (CARDIZEM  CD) 120 mg Oral Capsule, Sust. Release 24 hr Take 1 Capsule (120 mg total) by mouth Daily    ELIQUIS  5 mg Oral Tablet TAKE 1 TABLET TWICE DAILY    finasteride  (PROSCAR ) 5 mg Oral Tablet Take 1 Tablet (5 mg  total) by mouth Once a day    furosemide  (LASIX ) 40 mg Oral Tablet TAKE 1 TABLET EVERY DAY    losartan  (COZAAR ) 50 mg Oral Tablet Take 1 Tablet (50 mg total) by mouth Daily    Methylprednisolone  (MEDROL  DOSEPACK) 4 mg Oral Tablets, Dose Pack Take as instructed.    nitroGLYCERIN  (NITROSTAT ) 0.4 mg Sublingual Tablet, Sublingual Place 1 Tablet (0.4 mg total) under the tongue Every 5 minutes as needed for Chest pain    nystatin  (MYCOSTATIN ) 100,000 unit/mL Oral Suspension TAKE 5 MLS BY MOUTH FOUR TIMES A DAY AS NEEDED FOR THRUSH    potassium chloride  (K-DUR) 10 mEq Oral Tab Sust.Rel. Particle/Crystal TAKE 1 TABLET ONE TIME DAILY    rosuvastatin  (CRESTOR ) 10 mg Oral Tablet Take 1 Tablet (10 mg total) by mouth Every evening    SPIRIVA  WITH HANDIHALER 18 mcg Inhalation Capsule, w/Inhalation Device INHALE THE CONTENTS OF 1 CAPSULE EVERY DAY     Past Medical History:   Diagnosis Date    Arthritis     Atrial flutter     Cancer (CMS HCC)     Dysuria     Enteritis due to Rotavirus     Gout     Headache     Hemorrhoid     Hypercholesterolemia     Hypertension          Past Surgical History:   Procedure Laterality Date    HX HEMORRHOIDECTOMY           Social History     Tobacco Use    Smoking status: Former     Types: Cigars    Smokeless tobacco: Current     Types: Chew, Snuff   Substance Use Topics    Alcohol use: Yes     Alcohol/week: 2.0 standard drinks of alcohol     Types: 2 Cans of beer per week     Comment: few days a week        PHYSICAL EXAM:   The patient appears to be in no acute distress.  Vitals: BP 135/75 (Site: Left Arm, Patient Position: Sitting, Cuff Size: Adult)   Pulse (!) 48   Temp 36.6 C (97.8 F) (Temporal)   Ht 1.778 m (5' 10)   Wt 109 kg (240 lb)   SpO2 96%   BMI 34.44 kg/m      Respiratory: clear to ascultation B/L, no respiratory distress, equal BS throughout, no bibasilar rales  Heart: normal sinus rhythm today, no murmur, no edema, normal peripheral pulses  Abdomen: soft, nontender, positive  bowel sounds  Extremities: no edema, no calf pain or tenderness  Neuro: AAOx3, cranial nerves's intact, DTR's 2/4 throughout  Skin: warm and dry,  no rashes or lesions    ASSESSMENT:     ICD-10-CM    1. Primary hypertension  I10 BASIC METABOLIC PANEL, FASTING-his blood pressure is now well controlled with his current regimen of losartan  and diltiazem .     CBC      2. Bradycardia  R00.1 This has not improved since decreasing the dose of diltiazem .  He is going to follow up with his cardiologist as well.  He is completely asymptomatic from his mild bradycardia.  He will let us  know if he were to develop any symptoms and continue to monitor his heart rate at home.      3. Seasonal depression (CMS HCC)  F33.8       4. Chronic obstructive pulmonary disease, unspecified COPD type (CMS HCC)  J44.9 Continue current regimen of Spiriva .      5. Paroxysmal atrial flutter (CMS HCC)  I48.92 Continue rate control with beta-blocker and anticoagulation with Eliquis .  Continue following up with Cardiology.      6. Benign prostatic hyperplasia, unspecified whether lower urinary tract symptoms present  N40.0 Continue following up with Urology      7. Edema of both lower legs  R60.0 Continue Lasix  and potassium.         PLAN:   I reviewed appropriate screenings and HCM with patient.    Orders Placed This Encounter    BASIC METABOLIC PANEL, FASTING    CBC    Methylprednisolone  (MEDROL  DOSEPACK) 4 mg Oral Tablets, Dose Pack     Medication: continue current medication regimen unchanged  Return in about 3 months (around 05/22/2024), or For Medicare wellness, for blood work first.

## 2024-02-21 NOTE — Nursing Note (Signed)
 Pt presents to office for follow up.

## 2024-02-24 ENCOUNTER — Ambulatory Visit (INDEPENDENT_AMBULATORY_CARE_PROVIDER_SITE_OTHER)

## 2024-02-24 VITALS — Ht 64.0 in | Wt 213.0 lb

## 2024-02-24 DIAGNOSIS — Z0000 Encounter for general adult medical examination without abnormal findings: Secondary | ICD-10-CM

## 2024-02-24 NOTE — Progress Notes (Signed)
 Subjective:   Tanner [name] is a 78 y.o. who presents for a Medicare Wellness preventive visit.  As a reminder, Annual Wellness Visits don't include a physical exam, and some assessments may be limited, especially if this visit is performed virtually. We may recommend an in-person follow-up visit with your provider if needed.  Visit Complete: Virtual I connected with  Tanner [name] on 02/24/24 by a audio enabled telemedicine application and verified that I am speaking with the correct person using two identifiers.  Patient Location: Home  Provider Location: Office/Clinic  I discussed the limitations of evaluation and management by telemedicine. The patient expressed understanding and agreed to proceed.  Vital Signs: Because this visit was a virtual/telehealth visit, some criteria may be missing or patient reported. Any vitals not documented were not able to be obtained and vitals that have been documented are patient reported.  VideoDeclined- This patient declined Librarian, academic. Therefore the visit was completed with audio only.  Persons Participating in Visit: Patient.  AWV Questionnaire: No: Patient Medicare AWV questionnaire was not completed prior to this visit.  Cardiac Risk Factors include: advanced age (>9men, >11 women);hypertension;male gender;diabetes mellitus;Other (see comment), Risk factor comments: OSA     Objective:    Today's Vitals   02/24/24 1411  Weight: 213 lb (96.6 kg)  Height: 5' 4 (1.626 m)   Body mass index is 36.56 kg/m.     02/24/2024    2:17 PM 12/11/2023    8:00 PM 12/11/2023    3:44 PM 04/24/2023    7:31 PM 04/12/2023    8:08 AM 04/07/2023    4:20 AM 04/06/2023    1:49 PM  Advanced Directives  Does Patient Have a Medical Advance Directive? Yes Yes No Yes Yes Yes Yes  Type of Estate agent of [place];Living will Healthcare Power of Attorney  Living will;Healthcare Power of Attorney  Living will;Healthcare Power of [employer] Power of [employer] Power of [place];Living will  Does patient want to make changes to medical advance directive?  No - Patient declined  No - Patient declined  No - Patient declined No - Patient declined  Copy of Healthcare Power of Attorney in Chart? No - copy requested No - copy requested    No - copy requested No - copy requested  Would patient like information on creating a medical advance directive?   No - Patient declined    No - Patient declined    Current Medications (verified) Outpatient Encounter Medications as of 02/24/2024  Medication Sig   acetaminophen  (TYLENOL ) 500 MG tablet Take 1,000 mg by mouth every 8 (eight) hours as needed for moderate pain (pain score 4-6).   amiodarone  (PACERONE ) 200 MG tablet TAKE 1 TABLET BY MOUTH DAILY (Patient taking differently: Take 200 mg by mouth in the morning.)   apixaban  (ELIQUIS ) 5 MG TABS tablet Take 5 mg by mouth 2 (two) times daily.   ascorbic acid  (VITAMIN C ) 500 MG tablet Take 1 tablet (500 mg total) by mouth 2 (two) times daily.   atorvastatin  (LIPITOR ) 80 MG tablet Take 80 mg by mouth in the morning.   buPROPion  (WELLBUTRIN  SR) 150 MG 12 hr tablet Take 150 mg by mouth 2 (two) times daily.   carvedilol  (COREG ) 6.25 MG tablet TAKE 1 TABLET BY MOUTH 2 TIMES DAILY WITH A MEAL.   cetirizine  (ZYRTEC ) 10 MG tablet Take 1 tablet (10 mg total) by mouth daily.   diclofenac Sodium (VOLTAREN) 1 %  GEL Apply 2 g topically as needed.   doxycycline  (VIBRAMYCIN ) 100 MG capsule Take 1 capsule (100 mg total) by mouth 2 (two) times daily. Start doxycycline  AFTER completing the course of linezolid  (Zyvox ) and continue indefinitely   empagliflozin  (JARDIANCE ) 10 MG TABS tablet Take 5 mg by mouth daily.   finasteride  (PROSCAR ) 5 MG tablet Take 5 mg by mouth at bedtime.   gabapentin  (NEURONTIN ) 300 MG capsule Take 1 capsule (300 mg total) by mouth 2 (two) times daily.   guaiFENesin  (ROBITUSSIN) 100  MG/5ML liquid Take 5 mLs by mouth every 4 (four) hours as needed for cough or to loosen phlegm.   lidocaine  (XYLOCAINE ) 2 % solution USE AS DIRECTED 15 MLS IN THE MOUTH OR THROAT AS NEEDED FOR MOUTH PAIN.   melatonin 5 MG TABS Take 1 tablet (5 mg total) by mouth at bedtime.   Multiple Vitamins-Minerals (MULTIVITAMINS THER. W/MINERALS) TABS Take 1 tablet by mouth every evening.   nortriptyline  (PAMELOR ) 10 MG capsule Take 20 mg by mouth at bedtime.   nutrition supplement, JUVEN, (JUVEN) PACK Take 1 packet by mouth 2 (two) times daily between meals.   pantoprazole  (PROTONIX ) 40 MG tablet Take 1 tablet (40 mg total) by mouth daily at 12 noon.   polyethylene glycol (MIRALAX  / GLYCOLAX ) 17 g packet Take 17 g by mouth daily. (Patient taking differently: Take 17 g by mouth as needed for mild constipation or moderate constipation.)   potassium chloride  (KLOR-CON ) 10 MEQ tablet Take 10 mEq by mouth every evening.   senna (SENOKOT) 8.6 MG TABS tablet Take 2 tablets (17.2 mg total) by mouth at bedtime. (Patient taking differently: Take 2 tablets by mouth as needed for mild constipation or moderate constipation.)   SUMAtriptan  (IMITREX ) 25 MG tablet Take 25 mg by mouth every 2 (two) hours as needed for migraine.   torsemide  (DEMADEX ) 20 MG tablet Take 20 mg by mouth in the morning.   zinc  sulfate, 50mg  elemental zinc , 220 (50 Zn) MG capsule Take 1 capsule (220 mg total) by mouth daily.   No facility-administered encounter medications on file as of 02/24/2024.    Allergies (verified) Lisinopril, Amlodipine, and Codeine sulfate   History: Past Medical History:  Diagnosis Date   Acute kidney failure (HCC)    Anxiety    Arthritis    Back pain    BPH (benign prostatic hypertrophy)    CHF (congestive heart failure) (HCC)    Clostridium difficile infection    Clotting disorder (HCC)    Depression    Diabetes (HCC) 12/11/2016   type 2    DVT (deep venous thrombosis) (HCC)    DVT of deep femoral vein,  right (HCC) 06/29/2018   Edema, lower extremity    High cholesterol    Hypertension    Joint pain    Low back pain potentially associated with spinal stenosis    Neuromuscular disorder (HCC)    Neuropathy of lower extremity    bilateral   OSA (obstructive sleep apnea)    pt denies    Osteoarthritis    PE (pulmonary embolism)    Pneumonia    hx of x 2    Ventral hernia    Past Surgical History:  Procedure Laterality Date   BRONCHIAL WASHINGS  04/29/2023   Procedure: BRONCHIAL WASHINGS;  Surgeon: Neda Jennet LABOR, MD;  Location: [facility] ENDOSCOPY;  Service: Pulmonary;;   CARDIOVERSION N/A 08/19/2022   Procedure: CARDIOVERSION;  Surgeon: Hobart Powell BRAVO, MD;  Location: [facility] ENDOSCOPY;  Service:  Cardiovascular;  Laterality: N/A;   EYE SURGERY     HERNIA REPAIR  1999   I & D KNEE WITH POLY EXCHANGE Right 04/07/2023   Procedure: IRRIGATION AND DEBRIDEMENT KNEE WITH POLY EXCHANGE;  Surgeon: Ernie Cough, MD;  Location: [facility] OR;  Service: Orthopedics;  Laterality: Right;   TOTAL KNEE ARTHROPLASTY Left 08/26/2020   Procedure: LEFT TOTAL KNEE ARTHROPLASTY;  Surgeon: Liam Lerner, MD;  Location: [facility] ORS;  Service: Orthopedics;  Laterality: Left;   TOTAL KNEE ARTHROPLASTY Right 12/15/2022   Procedure: TOTAL KNEE ARTHROPLASTY;  Surgeon: Ernie Cough, MD;  Location: [facility] ORS;  Service: Orthopedics;  Laterality: Right;   TRANSESOPHAGEAL ECHOCARDIOGRAM (CATH LAB) N/A 04/12/2023   Procedure: TRANSESOPHAGEAL ECHOCARDIOGRAM;  Surgeon: Mona Vinie BROCKS, MD;  Location: [facility] INVASIVE CV LAB;  Service: Cardiovascular;  Laterality: N/A;   VIDEO BRONCHOSCOPY Bilateral 04/29/2023   Procedure: VIDEO BRONCHOSCOPY WITHOUT FLUORO;  Surgeon: Neda Jennet LABOR, MD;  Location: [facility] ENDOSCOPY;  Service: Pulmonary;  Laterality: Bilateral;  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   Family History  Problem Relation Age of Onset   Diabetes Mother    Pneumonia Mother    Alzheimer's disease Mother    Hyperlipidemia Mother     Thyroid  disease Mother    Depression Mother    Other Father 39       Drowned on boating accident   Colon cancer Neg Hx    Colon polyps Neg Hx    Social History   Socioeconomic History   Marital status: Widowed    Spouse name: Not on file   Number of children: 2   Years of education: Not on file   Highest education level: Not on file  Occupational History   Occupation: Retired  Tobacco Use   Smoking status: Former    Current packs/day: 0.00    Average packs/day: 2.0 packs/day for 33.0 years (66.1 ttl pk-yrs)    Types: Cigarettes    Start date: 08/30/1968    Quit date: 07/02/2001    Years since quitting: 22.6   Smokeless tobacco: Never   Tobacco comments:    quit 12 years ago  Vaping Use   Vaping status: Never Used  Substance and Sexual Activity   Alcohol use: Yes    Comment: seldom    Drug use: No   Sexual activity: Not Currently  Other Topics Concern   Not on file  Social History Narrative   [employer]   [employer]-2 years   Married '66    2 sons '68, '72; 3 grandchildren   [employer] doctor.  Retired.    Lives alone/2025   Social Drivers of Health   Financial Resource Strain: Low Risk  (10/27/2022)   Overall Financial Resource Strain (CARDIA)    Difficulty of Paying Living Expenses: Not hard at all  Food Insecurity: No Food Insecurity (12/11/2023)   Hunger Vital Sign    Worried About Running Out of Food in the Last Year: Never true    Ran Out of Food in the Last Year: Never true  Transportation Needs: No Transportation Needs (02/24/2024)   PRAPARE - Administrator, Civil Service (Medical): No    Lack of Transportation (Non-Medical): No  Physical Activity: Inactive (02/24/2024)   Exercise Vital Sign    Days of Exercise per Week: 0 days    Minutes of Exercise per Session: 0 min  Stress: No Stress Concern Present (02/24/2024)   [employer] of Occupational Health - Occupational Stress Questionnaire    Feeling of Stress:  Not at all  Social  Connections: Socially Isolated (02/24/2024)   Social Connection and Isolation Panel    Frequency of Communication with Friends and Family: More than three times a week    Frequency of Social Gatherings with Friends and Family: Once a week    Attends Religious Services: Never    Database administrator or Organizations: No    Attends Banker Meetings: Never    Marital Status: Widowed    Tobacco Counseling Counseling given: Not Answered Tobacco comments: quit 12 years ago    Clinical Intake:  Pre-visit preparation completed: Yes  Pain : No/denies pain     BMI - recorded: 36.56 Nutritional Status: BMI > 30  Obese Nutritional Risks: None Diabetes: Yes Did pt. bring in CBG monitor from home?: No  Lab Results  Component Value Date   HGBA1C 5.6 01/24/2024   HGBA1C 5.3 03/22/2023   HGBA1C 4.8 12/02/2022     How often do you need to have someone help you when you read instructions, pamphlets, or other written materials from your doctor or pharmacy?: 1 - Never  Interpreter Needed?: No  Information entered by :: Karinne Schmader, RMA   Activities of Daily Living     02/24/2024    2:13 PM 12/11/2023    8:00 PM  In your present state of health, do you have any difficulty performing the following activities:  Hearing? 1 0  Comment Has hearing aides   Vision? 0 1  Difficulty concentrating or making decisions? 0 0  Walking or climbing stairs? 0   Dressing or bathing? 0   Doing errands, shopping? 0 1  Comment has rides from family   Preparing Food and eating ? N   Using the Toilet? N   In the past six months, have you accidently leaked urine? N   Do you have problems with loss of bowel control? N   Managing your Medications? N   Managing your Finances? N   Housekeeping or managing your Housekeeping? N     Patient Care Team: Rollene Almarie LABOR, MD as PCP - General (Internal Medicine) Alvan Ronal BRAVO, MD (Inactive) as PCP - Cardiology (Cardiology) Rollene Almarie LABOR, MD as PCP - Family Medicine (Internal Medicine) Szabat, Toribio BROCKS, [facility] (Inactive) (Pharmacist) Ernie Cough, MD as Consulting Physician (Orthopedic Surgery)  I have updated your Care Teams any recent Medical Services you may have received from other providers in the past year.     Assessment:   This is a routine wellness examination for Tanner [name].  Hearing/Vision screen Hearing Screening - Comments:: Has hearing aides Vision Screening - Comments:: [facility]   Goals Addressed   None    Depression Screen     02/24/2024    2:27 PM 01/24/2024   10:27 AM 09/13/2023   11:09 AM 06/30/2023    2:14 PM 01/04/2023    1:37 PM 10/27/2022    1:12 PM 10/01/2022    3:10 PM  PHQ 2/9 Scores  PHQ - 2 Score 2 0 6  0 0 0  PHQ- 9 Score 2 0 12  0 0   Exception Documentation    Other- indicate reason in comment box     Not completed    Son does assessment       Fall Risk     02/24/2024    2:20 PM 09/13/2023   11:09 AM 07/13/2023    2:06 PM 06/30/2023    2:21 PM 01/04/2023    1:36  PM  Fall Risk   Falls in the past year? 1 1 0 0 0  Number falls in past yr: 1 1 0  0  Injury with Fall? 0 1 0  0  Risk for fall due to : Impaired balance/gait History of fall(s)     Follow up Falls evaluation completed;Falls prevention discussed  Falls evaluation completed  Falls evaluation completed    MEDICARE RISK AT HOME:  Medicare Risk at Home Any stairs in or around the home?: Yes (outside in front/and has a ramp) If so, are there any without handrails?: No Home free of loose throw rugs in walkways, pet beds, electrical cords, etc?: Yes Adequate lighting in your home to reduce risk of falls?: No Life alert?: Yes Use of a cane, walker or w/c?: Yes (walker and W/C) Grab bars in the bathroom?: Yes Shower chair or bench in shower?: Yes Elevated toilet seat or a handicapped toilet?: Yes  TIMED UP AND GO:  Was the test performed?  No  Cognitive Function: Declined/Normal: No  cognitive concerns noted by patient or family. Patient alert, oriented, able to answer questions appropriately and recall recent events. No signs of memory loss or confusion.        10/27/2022    1:18 PM 11/25/2021    4:21 PM  6CIT Screen  What Year? 0 points 0 points  What month? 0 points 0 points  What time? 0 points 0 points  Count back from 20 0 points 0 points  Months in reverse 0 points 0 points  Repeat phrase 0 points 0 points  Total Score 0 points 0 points    Immunizations Immunization History  Administered Date(s) Administered   Fluad Quad(high Dose 65+) 02/22/2019, 04/15/2020   H1N1 05/25/2008   INFLUENZA, HIGH DOSE SEASONAL PF 02/26/2016, 03/17/2017, 02/28/2018   Influenza, Seasonal, Injecte, Preservative Fre 04/02/2009, 04/03/2010, 03/24/2013, 04/06/2014   Influenza,inj,Quad PF,6+ Mos 03/15/2015, 02/25/2021   Influenza-Unspecified 02/16/2008, 03/11/2011, 05/06/2012, 02/14/2015, 03/31/2016, 03/15/2017, 02/22/2019, 04/29/2022   Moderna Covid-19 Fall Seasonal Vaccine 5yrs & older 04/29/2022   Moderna Sars-Covid-2 Vaccination 07/31/2019, 08/29/2019, 05/07/2020, 01/20/2021   Pneumococcal Conjugate-13 09/11/2013, 03/15/2015   Pneumococcal Polysaccharide-23 04/03/2010   Td 04/15/2020   Td (Adult), 2 Lf Tetanus Toxid, Preservative Free 04/15/2020   Tdap 11/27/2008   Zoster Recombinant(Shingrix) 06/19/2021, 09/15/2021   Zoster, Live 02/05/2010    Screening Tests Health Maintenance  Topic Date Due   OPHTHALMOLOGY EXAM  Never done   Medicare Annual Wellness (AWV)  10/27/2023   Influenza Vaccine  01/14/2024   COVID-19 Vaccine (7 - 2025-26 season) 02/14/2024   HEMOGLOBIN A1C  07/26/2024   Diabetic kidney evaluation - eGFR measurement  01/23/2025   Diabetic kidney evaluation - Urine ACR  01/23/2025   FOOT EXAM  01/23/2025   DTaP/Tdap/Td (4 - Td or Tdap) 04/15/2030   Pneumococcal Vaccine: 50+ Years  Completed   Hepatitis C Screening  Completed   Zoster Vaccines-  Shingrix  Completed   HPV VACCINES  Aged Out   Meningococcal B Vaccine  Aged Out   Colonoscopy  Discontinued    Health Maintenance Items Addressed: See Nurse Notes at the end of this note  Additional Screening:  Vision Screening: Recommended annual ophthalmology exams for early detection of glaucoma and other disorders of the eye. Is the patient up to date with their annual eye exam?  No  Who is the provider or what is the name of the office in which the patient attends [facility] exams[facility]  [place]  Dental Screening: Recommended annual dental exams for proper oral hygiene  Community Resource Referral / Chronic Care Management: CRR required this visit?  No   CCM required this visit?  No   Plan:    I have personally reviewed and noted the following in the patient's chart:   Medical and social history Use of alcohol, tobacco or illicit drugs  Current medications and supplements including opioid prescriptions. Patient is not currently taking opioid prescriptions. Functional ability and status Nutritional status Physical activity Advanced directives List of other physicians Hospitalizations, surgeries, and ER visits in previous 12 months Vitals Screenings to include cognitive, depression, and falls Referrals and appointments  In addition, I have reviewed and discussed with patient certain preventive protocols, quality metrics, and best practice recommendations. A written personalized care plan for preventive services as well as general preventive health recommendations were provided to patient.   Lark Runk L Elias Bordner, CMA   02/24/2024   After Visit Summary: (MyChart) Due to this being a telephonic visit, the after visit summary with patients personalized plan was offered to patient via MyChart   Notes: Patient is due for a diabetic eye exam.  He has concerns he would like to discuss about his walking ability, with Dr. Rollene.  Patient stated [facility], so after that appointment, he stated that he will follow up with Dr. Rollene.  Patient also would like to discuss getting put on some viagra, as he stated that he has a girlfriend now.

## 2024-02-24 NOTE — Patient Instructions (Signed)
 Tanner [name],  Thank you for taking the time for your Medicare Wellness Visit. I appreciate your continued commitment to your health goals. Please review the care plan we discussed, and feel free to reach out if I can assist you further.  Medicare recommends these wellness visits once per year to help you and your care team stay ahead of potential health issues. These visits are designed to focus on prevention, allowing your provider to concentrate on managing your acute and chronic conditions during your regular appointments.  Please note that Annual Wellness Visits do not include a physical exam. Some assessments may be limited, especially if the visit was conducted virtually. If needed, we may recommend a separate in-person follow-up with your provider.  Ongoing Care Seeing your primary care provider every 3 to 6 months helps us  monitor your health and provide consistent, personalized care. Last office visit on 01/24/2024.    Referrals If a referral was made during today's visit and you haven't received any updates within two weeks, please contact the referred provider directly to check on the status.  Recommended Screenings:  Health Maintenance  Topic Date Due   Eye exam for diabetics  Never done   Medicare Annual Wellness Visit  10/27/2023   Flu Shot  01/14/2024   COVID-19 Vaccine (7 - 2025-26 season) 02/14/2024   Hemoglobin A1C  07/26/2024   Yearly kidney function blood test for diabetes  01/23/2025   Yearly kidney health urinalysis for diabetes  01/23/2025   Complete foot exam   01/23/2025   DTaP/Tdap/Td vaccine (4 - Td or Tdap) 04/15/2030   Pneumococcal Vaccine for age over 45  Completed   Hepatitis C Screening  Completed   Zoster (Shingles) Vaccine  Completed   HPV Vaccine  Aged Out   Meningitis B Vaccine  Aged Out   Colon Cancer Screening  Discontinued       02/24/2024    2:17 PM  Advanced Directives  Does Patient Have a Medical Advance Directive? Yes  Type of Sports coach of [place];Living will  Copy of Healthcare Power of Attorney in Chart? No - copy requested   Advance Care Planning is important because it: Ensures you receive medical care that aligns with your values, goals, and preferences. Provides guidance to your family and loved ones, reducing the emotional burden of decision-making during critical moments.  Vision: Annual vision screenings are recommended for early detection of glaucoma, cataracts, and diabetic retinopathy. These exams can also reveal signs of chronic conditions such as diabetes and high blood pressure.  Dental: Annual dental screenings help detect early signs of oral cancer, gum disease, and other conditions linked to overall health, including heart disease and diabetes.  Please see the attached documents for additional preventive care recommendations.

## 2024-02-25 ENCOUNTER — Other Ambulatory Visit (INDEPENDENT_AMBULATORY_CARE_PROVIDER_SITE_OTHER): Payer: Self-pay | Admitting: Family Medicine

## 2024-02-25 NOTE — Telephone Encounter (Signed)
 Last Visit:02/21/2024     Upcoming appointments: 06/21/2024           Nat Lime, MA  02/25/2024, 11:09

## 2024-02-26 DIAGNOSIS — J9601 Acute respiratory failure with hypoxia: Secondary | ICD-10-CM | POA: Diagnosis not present

## 2024-03-10 ENCOUNTER — Other Ambulatory Visit (INDEPENDENT_AMBULATORY_CARE_PROVIDER_SITE_OTHER): Payer: Self-pay

## 2024-03-10 DIAGNOSIS — Z7189 Other specified counseling: Secondary | ICD-10-CM

## 2024-03-10 NOTE — Nursing Note (Signed)
 POPULATION HEALTH    COMPLEX CARE MANAGEMENT    Chronic Care Management - CCM  Status: Enrolled  Effective Dates: 01/10/2024 - present  Responsible Staff: Atanacio Claude, RN          Patient reports to be doing okay . He has been a little upset and sad lately. Patient noted that for the past 6 weeks he has been mad a God for losing 8 people in his life in 7 years. [facility] offered support. [facility] offered [facility] consult , whom he spoke and worked with kate in the past patient agreeable. Patient saw Dr. Barbie and was given a medrol  dose pack for poison. He is breathing okay no Chest pain or SOB., Patient has minimal leg edema . He remains active and eating healthy. Patient BP has been stable last was 135/75. Patient confirmed upcoming cardiac appointment. No medications needed. [facility] will continue to follow patient.     Care Management Tasks Completed:  Chart review completed.  Social determinants of health reviewed and updated as needed.  The following Care plan Lifestyle,  HTN, Wellness were reviewed/discussed/updated.  Interventions ongoing and updated as needed.  Goals addressed and updated.  Interventions ongoing and updated as needed.  The following Education HTN, General Wellness, Safety, Depression anxiety were completed.  Self management plan reviewed and updated.  See patient care coordination note.  Barriers to health, care plan, and goals reviewed.  Interventions ongoing and updated as needed.  The following Health maintenance/care gaps Vaccinations were  reviewed and discussed with patient.  Health maintenance/care gaps updated as applicable.  Discussed upcoming appointment dates and times.  Reinforced importance of keeping all provider appointments.  Reinforced the importance of taking all medications as prescribed.  Medical history, surgical history, hospitalizations, and medications reviewed and updated as applicable.    Ongoing and Updated Assessment Interventions:  Discussed mood   Reviewed upcoming  appointments  Discussed cardiac health   Encouraged BP checks   Referral placed to [facility] to [facility]     Case Manager To Do List for the Next Interaction:  Monthly follow up  F.U BP   F/U Cardiac   F/U Breathing   F/U Bhi referral     Plan to call patient in ~ one month to reassess and update plan of care.  Instructed patient to call with change in symptoms or as needed prior to next follow up.      Claude Atanacio, RN

## 2024-03-13 ENCOUNTER — Encounter (HOSPITAL_COMMUNITY): Payer: Self-pay

## 2024-03-13 ENCOUNTER — Telehealth (INDEPENDENT_AMBULATORY_CARE_PROVIDER_SITE_OTHER): Payer: Self-pay | Admitting: Family Medicine

## 2024-03-13 ENCOUNTER — Ambulatory Visit (HOSPITAL_COMMUNITY): Payer: Self-pay

## 2024-03-13 DIAGNOSIS — F338 Other recurrent depressive disorders: Secondary | ICD-10-CM

## 2024-03-13 DIAGNOSIS — F32A Depression, unspecified: Secondary | ICD-10-CM

## 2024-03-13 NOTE — Telephone Encounter (Signed)
-----   Message from Ryan NOVAK, [place] sent at 03/10/2024  3:35 PM EDT -----  Regarding: [facility] referral  Hello, I spoke with Bill today, he seemed a little down and depressed. I offered support . I know he was in [facility] program before he said he would be interested in speaking with [facility] again. The referral has to come from PCP, if you would , could you please place referral , type BHI in search box an dit will populate.     Thank you so much ,   Farran

## 2024-03-13 NOTE — Nursing Note (Signed)
 Behavioral Health Integration--Enrollment In Progress  Population Health Management  BHI Enrollment Status: In Progress      Patient was enrolled in BHI on 04/14/2023  9:51 AM.   The patient is   in BHI.    BHI Care Team:   Primary Care Provider Lauraine Plants, DO  Behavioral Health Case Manager Leobardo Collar, MSW  Collaborating Psychiatrist No care team member to display     Received referral to [facility] Integration from Primary Care Provider. The patient has been marked as identified for potential enrollment. A future outgoing call will be made to the patient to further discuss Behavioral Health Integration and obtain verbal consent if interested.    Patient known to MSW from previous BHI enrollment. MSW will discuss with [facility] as well and Dr. Cleotilde.   Collar Leobardo, MSW  03/13/2024, 12:31  Behavioral Health Case Manager

## 2024-03-14 ENCOUNTER — Other Ambulatory Visit (INDEPENDENT_AMBULATORY_CARE_PROVIDER_SITE_OTHER): Payer: Self-pay

## 2024-03-14 ENCOUNTER — Ambulatory Visit (HOSPITAL_COMMUNITY): Payer: Self-pay

## 2024-03-14 DIAGNOSIS — E782 Mixed hyperlipidemia: Secondary | ICD-10-CM

## 2024-03-14 DIAGNOSIS — I10 Essential (primary) hypertension: Secondary | ICD-10-CM

## 2024-03-14 DIAGNOSIS — F338 Other recurrent depressive disorders: Secondary | ICD-10-CM

## 2024-03-14 DIAGNOSIS — Z7189 Other specified counseling: Secondary | ICD-10-CM

## 2024-03-14 NOTE — Nursing Note (Signed)
 Behavioral Health Integration--Brief Note  BHI Enrollment Date Patient was enrolled in BHI on 04/14/2023  9:51 AM.     PCP Lauraine Plants, DO   Collaborating Psychiatrist: No care team member to display  Behavioral Health Case Manager: Leobardo Collar, MSW         MSW discussed referral with Dr. Cleotilde this date. Patient re-referred to BHI, seasonal depression and depressive symptoms. Patient by history was graduated with recommendation for therapy and patient was not interested in medication. Light therapy was recommended during BHI enrollment.   Upon discussion with Dr. Cleotilde, MSW to contact patient and explore needs. Consider enrollment with education on BHI short term stabilization goal, and linkage to therapy for long term support and explore return to light therapy.   Total BHI time spent this encounter is 0 minutes.    Collar Leobardo, MSW  03/14/2024, 10:12  Behavioral Health Case Manager

## 2024-03-14 NOTE — Progress Notes (Signed)
 Dr. Lauraine Plants, DO      This patient has met the requirements to bill for Chronic Care Management services this month. Please sign this encounter as you typically would any other encounter. There is NO SPECIAL ATTESTATION needed . COSIGN ONLY. Please let us  know if you have any questions at all.    Chronic Care Management Time Documentation on 03/14/2024 13:21.   Time spent during current encounter is 1 minutes.   Cumulative time during current month's episode (month-to-date) is 45 minutes.          ICD-10-CM    1. Encounter for counseling for care management of patient with chronic conditions and complex health needs using nurse-based model  Z71.89       2. Essential hypertension  I10       3. Seasonal depression (CMS HCC)  F33.8       4. Mixed hyperlipidemia  E78.2             acetaminophen (TYLENOL) 500 mg Oral Tablet, Take 1 Tablet (500 mg total) by mouth Every 4 hours as needed for Pain (pt takes tylenol pm)  alfuzosin  (UROXATRAL ) 10 mg Oral Tablet Sustained Release 24 hr, Take 1 Tablet (10 mg total) by mouth Once a day  dilTIAZem  (CARDIZEM  CD) 120 mg Oral Capsule, Sust. Release 24 hr, Take 1 Capsule (120 mg total) by mouth Daily  ELIQUIS  5 mg Oral Tablet, TAKE 1 TABLET TWICE DAILY  finasteride  (PROSCAR ) 5 mg Oral Tablet, Take 1 Tablet (5 mg total) by mouth Once a day  furosemide  (LASIX ) 40 mg Oral Tablet, TAKE 1 TABLET EVERY DAY  losartan  (COZAAR ) 50 mg Oral Tablet, Take 1 Tablet (50 mg total) by mouth Daily  Methylprednisolone  (MEDROL  DOSEPACK) 4 mg Oral Tablets, Dose Pack, Take as instructed.  nitroGLYCERIN  (NITROSTAT ) 0.4 mg Sublingual Tablet, Sublingual, Place 1 Tablet (0.4 mg total) under the tongue Every 5 minutes as needed for Chest pain  nystatin  (MYCOSTATIN ) 100,000 unit/mL Oral Suspension, TAKE 5 MLS BY MOUTH FOUR TIMES A DAY AS NEEDED FOR THRUSH  potassium chloride  (K-DUR) 10 mEq Oral Tab Sust.Rel. Particle/Crystal, TAKE 1 TABLET ONE TIME DAILY  rosuvastatin  (CRESTOR ) 10 mg Oral Tablet, Take 1  Tablet (10 mg total) by mouth Every evening  SPIRIVA  WITH HANDIHALER 18 mcg Inhalation Capsule, w/Inhalation Device, INHALE THE CONTENTS OF 1 CAPSULE EVERY DAY    No facility-administered medications prior to visit.        Thanks,  Your Chronic Care Management Team

## 2024-03-15 ENCOUNTER — Ambulatory Visit (HOSPITAL_COMMUNITY): Payer: Self-pay

## 2024-03-15 DIAGNOSIS — F338 Other recurrent depressive disorders: Secondary | ICD-10-CM

## 2024-03-15 NOTE — Nursing Note (Addendum)
 Behavioral Health Integration--Brief Note  BHI Enrollment Date Patient was enrolled in BHI on 04/14/2023  9:51 AM.     PCP Lauraine Plants, DO   Collaborating Psychiatrist: No care team member to display  Behavioral Health Case Manager: Leobardo Collar, MSW          MSW reviewed call back from patient this date. He reports he has been suffering from symptoms related to grief of loss of people he cared significantly for over the last 7 years. Patient reports he wants to begin therapy. He no longer has resources MSW provided by history. He was agreeable to referral to [facility] for consideration of telehealth. He also was agreeable to additional therapy options in his area. MSW searched [facility] provider lists and compiled and sent to patient via MyChart with his her permission.     [facility] [place] - [phone]  [place] - [phone]  [facility] [place] [phone]  [facility] [place] - [phone]  [facility] [place] - [phone]    At this time patient reports he would like to try therapy rather than exploration of medication management.   Total BHI time spent this encounter is 17 minutes.    Collar Leobardo, MSW  03/15/2024, 13:29  Behavioral Health Case Manager          Behavioral Health Integration--Call Attempt  Patient was enrolled in BHI on 04/14/2023  9:51 AM.   The patient is   in BHI.    BHI Care Team:   Primary Care Provider Lauraine Plants, DO  Behavioral Health Case Manager Leobardo Collar, MSW  Collaborating Psychiatrist No care team member to display     This MSW attempted to contact patient. Patient unreachable at this time.  MSW left message with returned contact information.     Collar Leobardo, MSW  03/15/2024, 13:06  Behavioral Health Case Manager

## 2024-03-20 ENCOUNTER — Ambulatory Visit (HOSPITAL_COMMUNITY): Payer: Self-pay

## 2024-03-20 DIAGNOSIS — F338 Other recurrent depressive disorders: Secondary | ICD-10-CM

## 2024-03-20 NOTE — Nursing Note (Signed)
 Behavioral Health Integration--Brief Note  BHI Enrollment Date Patient was enrolled in BHI on 04/14/2023  9:51 AM.     PCP Lauraine Plants, DO   Collaborating Psychiatrist: No care team member to display  Behavioral Health Case Manager: Leobardo Collar, MSW         MSW spoke with patient via telephone this date to follow up. He reports he did receive the outpatient therapy resources MSW provided. Patient said he wishes to be more socially active and feels that helps his mood. He intends to set up therapy.   MSW closing BHI referral at this time, as patient is seeking therapy services.   Total BHI time spent this encounter is 3 minutes.    Collar Leobardo, MSW  03/20/2024, 14:15  Behavioral Health Case Manager

## 2024-03-22 ENCOUNTER — Telehealth (INDEPENDENT_AMBULATORY_CARE_PROVIDER_SITE_OTHER)

## 2024-03-22 DIAGNOSIS — F3341 Major depressive disorder, recurrent, in partial remission: Secondary | ICD-10-CM

## 2024-03-22 DIAGNOSIS — F329 Major depressive disorder, single episode, unspecified: Secondary | ICD-10-CM

## 2024-03-22 NOTE — Progress Notes (Signed)
 [place]  [facility]  & [facility]  Department of Behavioral Medicine and Psychiatry  [facility]  New Patient Intake    TELEMEDICINE DOCUMENTATION:    Patient Location:  MyChart video visit from home address: [address]  [place] [address]    Patient/family aware of provider location:  yes  Patient/family consent for telemedicine:  yes  Examination observed and performed by:  Rollo Relic, LICSW   I have discussed the risks/burdens of in person vs. Telehealth care with my patient. My patient agrees that the risks/burdens associated with an in-person service outweigh the benefits associated with furnishing the in person visit due to physical distance and travel. This will be reassessed at each visit.     PATIENT NAME:  Thomas [name]  DATE OF BIRTH:  [birth year]  CHART NUMBER: [id number]  PAYOR SOURCE:    Payor: [employer] MEDICARE / Plan: [employer] CHOICE PPO / Product Type: PPO /   LOS: [id number]    DATE OF SERVICE: 03/22/2024    Time started: 4:11PM -  Time stopped: 4:56PM    Chief Complaint(s)  Loneliness     History of Present Illness  Patient is a 78 year old widowed Caucasian man presenting for initial assessment to begin individual therapy.  He denies any current anxiety and depression as he feels his biggest concern his loneliness.  He denies SI/HI; AH/VH; nor was there any evidence of delusional or paranoid thinking. He admits being angry with God after losing 8 close people in a 7 year period.     Substance Use History  Substance Use: Denies any drug use; will have one drink 2-5 days a week depending on the week. Denies drinking to change the way he feels or to cope.    Gambling: Denies     Substance Addiction Treatment: Denies     Legal History: Denies     Past Psychiatric History  Current providers- Denies   History Inpatient- Denies  History Outpatient- Saw a therapist in his 30s due to control issues  Suicide attempts- Denies    Family history of  mental health/addiction:  Depression: Denies  Anxiety: Denies  Bipolar D/O: Denies   Schizophrenia: Denies  Suicide Attempts: Denies  Alcohol: Denies  Drugs: Denies    Medical History  Hypertension; high cholesterol; COPD; atrial flutter    Allergy and Current Medications  Allergies as of 03/22/2024 - Reviewed 02/21/2024   Allergen Reaction Noted    Carvedilol   Other Adverse Reaction (Add comment) 11/23/2023    Lisinopril   Other Adverse Reaction (Add comment) 11/23/2023    Flomax  [tamsulosin ] Nausea/ Vomiting 07/30/2021    Hydralazine   Other Adverse Reaction (Add comment) 12/21/2023    Losartan   Other Adverse Reaction (Add comment) 11/23/2023     Current Medications[1]    Social History  Patient is a 78 y.o. White male male from [place] [address], [place].  Thomas [name] is a widow and is not in a relationship. He does  have children, 3 daughters, one is deceased.  Thomas [name] does not  report having difficulty paying rent/mortgage. He denies having a history of homelessness.  He denies significant financial stressors or debt.     Client was raised by his parents, and has a brother, and one sister who passed away  6 months after his wife did.He describes having anxiety in his childhood, but does not know why.  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 He denies any trauma, abuse,  or neglect. He felt more loved by his mother than his father. He described his father as detached.  Client has some college level education.  He had three years of college, but did not complete.  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 He  does not  report having difficulty with literacy; can read and write adequately.  Patient reports he was not in special education.   Client denies ongoing or past legal concerns.  Client denies service in the [employer].    Mental Status    Eye Contact:  good.    Behavior:  cooperative.    Attention:  good.    Speech:  normal rate and volume.    Motor:  no psychomotor  retardation or agitation.    Mood:  euthymic.    Affect:  congruent to mood.    Thought Process:  goal oriented.    Thought Content:  no paranoia or delusions.    Perception:  no hallucinations endorsed  Cognition:  good abstract ability.    Insight:  good.    Judgement:  good.  Appetite: Normal   Sleep: Normal; gets 6-9 hours a night     Assessment:  Depression, NOS    Positive Supports:   Youngest daughter; few friends; brother     History of Suicidal/Homicidal/Self-Injurious Behaviors:  Denies    Summary: Thomas [name] is a 78 y.o. widowed White male with some college-level education. He has presented at [facility] for an intake assessment and primary concern of loneliness. Thomas [name] does have a history of psychiatric treatment. He is Fully oriented to person, place, time and situation.  Thomas [name] current stressors include Poor Coping Skills.    Treatment plan at this time will be for client to attend individual therapy sessions.  Client's goals for treatment include To learn healthy coping skills to effectively manage loneliness, grief and depression, developing and using coping skills, developing and using relaxation techniques, and developing and using containment strategies.      Rollo Relic, MSW, LICSW  Clinical Therapist   Department of Behavioral Medicine & Psychiatry  03/22/2024, 16:12         [1]   Current Outpatient Medications:     acetaminophen (TYLENOL) 500 mg Oral Tablet, Take 1 Tablet (500 mg total) by mouth Every 4 hours as needed for Pain (pt takes tylenol pm), Disp: , Rfl:     alfuzosin  (UROXATRAL ) 10 mg Oral Tablet Sustained Release 24 hr, Take 1 Tablet (10 mg total) by mouth Once a day, Disp: 90 Tablet, Rfl: 3    dilTIAZem  (CARDIZEM  CD) 120 mg Oral Capsule, Sust. Release 24 hr, Take 1 Capsule (120 mg total) by mouth Daily, Disp: 90 Capsule, Rfl: 1    ELIQUIS  5 mg Oral Tablet, TAKE 1 TABLET TWICE DAILY, Disp: 180 Tablet, Rfl: 3    finasteride  (PROSCAR ) 5 mg Oral  Tablet, Take 1 Tablet (5 mg total) by mouth Once a day, Disp: 90 Tablet, Rfl: 3    furosemide  (LASIX ) 40 mg Oral Tablet, TAKE 1 TABLET EVERY DAY, Disp: 90 Tablet, Rfl: 3    losartan  (COZAAR ) 50 mg Oral Tablet, Take 1 Tablet (50 mg total) by mouth Daily, Disp: 90 Tablet, Rfl: 1    Methylprednisolone  (MEDROL  DOSEPACK) 4 mg Oral Tablets, Dose Pack, Take as instructed., Disp: 21 Tablet, Rfl: 0    nitroGLYCERIN  (NITROSTAT ) 0.4 mg Sublingual Tablet, Sublingual, Place 1 Tablet (0.4 mg total) under the  tongue Every 5 minutes as needed for Chest pain, Disp: 30 Tablet, Rfl: 1    nystatin  (MYCOSTATIN ) 100,000 unit/mL Oral Suspension, TAKE 5 MLS BY MOUTH FOUR TIMES A DAY AS NEEDED FOR THRUSH, Disp: 473 mL, Rfl: 1    potassium chloride  (K-DUR) 10 mEq Oral Tab Sust.Rel. Particle/Crystal, TAKE 1 TABLET ONE TIME DAILY, Disp: 90 Tablet, Rfl: 3    rosuvastatin  (CRESTOR ) 10 mg Oral Tablet, Take 1 Tablet (10 mg total) by mouth Every evening, Disp: 90 Tablet, Rfl: 3    SPIRIVA  WITH HANDIHALER 18 mcg Inhalation Capsule, w/Inhalation Device, INHALE THE CONTENTS OF 1 CAPSULE EVERY DAY, Disp: 90 Capsule, Rfl: 3

## 2024-03-27 ENCOUNTER — Telehealth: Payer: Self-pay | Admitting: *Deleted

## 2024-03-27 DIAGNOSIS — J9601 Acute respiratory failure with hypoxia: Secondary | ICD-10-CM | POA: Diagnosis not present

## 2024-03-27 NOTE — Telephone Encounter (Signed)
 Copied from CRM [id number]. Topic: Clinical - Home Health Verbal Orders >> Mar 27, 2024  2:20 PM Diannia H wrote: Caller/Agency: Zacharia/ [facility] Callback Number: [phone] Service Requested: Occupational Therapy Frequency: 1 time a week 4 weeks, then a phone call, 1 time a week 2 weeks Any new concerns about the patient? No

## 2024-03-31 ENCOUNTER — Encounter (INDEPENDENT_AMBULATORY_CARE_PROVIDER_SITE_OTHER): Payer: Self-pay | Admitting: PHYSICIAN ASSISTANT

## 2024-03-31 ENCOUNTER — Other Ambulatory Visit: Payer: Self-pay

## 2024-03-31 ENCOUNTER — Encounter (INDEPENDENT_AMBULATORY_CARE_PROVIDER_SITE_OTHER): Payer: Self-pay | Admitting: Medical

## 2024-03-31 ENCOUNTER — Ambulatory Visit: Attending: PHYSICIAN ASSISTANT | Admitting: PHYSICIAN ASSISTANT

## 2024-03-31 ENCOUNTER — Ambulatory Visit (INDEPENDENT_AMBULATORY_CARE_PROVIDER_SITE_OTHER): Payer: Self-pay | Admitting: Medical

## 2024-03-31 VITALS — BP 142/65 | HR 83 | Wt 251.0 lb

## 2024-03-31 VITALS — BP 122/82 | HR 55 | Ht 70.0 in | Wt 251.0 lb

## 2024-03-31 DIAGNOSIS — I4892 Unspecified atrial flutter: Secondary | ICD-10-CM | POA: Insufficient documentation

## 2024-03-31 DIAGNOSIS — E782 Mixed hyperlipidemia: Secondary | ICD-10-CM | POA: Insufficient documentation

## 2024-03-31 DIAGNOSIS — I10 Essential (primary) hypertension: Secondary | ICD-10-CM | POA: Insufficient documentation

## 2024-03-31 LAB — ECG 12 LEAD
Atrial Rate: 51 {beats}/min
Calculated P Axis: 48 degrees
Calculated R Axis: -70 degrees
Calculated T Axis: 62 degrees
PR Interval: 222 ms
QRS Duration: 92 ms
QT Interval: 434 ms
QTC Calculation: 400 ms
Ventricular rate: 51 {beats}/min

## 2024-03-31 MED ORDER — LOSARTAN 50 MG TABLET
50.0000 mg | ORAL_TABLET | Freq: Two times a day (BID) | ORAL | 1 refills | Status: DC
Start: 2024-03-31 — End: 2024-04-14

## 2024-03-31 NOTE — Progress Notes (Signed)
 CARDIOLOGY, [facility]  [address]  [place] [address]  Operated by [facility]    Name: Thomas [name] MRN:  [id number]   Date: 03/31/2024 DOB:  10-Feb-1946 (78 y.o.)        Thomas [name] is a 78 y.o. male seen in the office on 03/31/2024    Chief Complaint   Patient presents with    Follow Up 6 Months     PAF         This note was created with assistance from [employer] via capture of conversational audio. Consent was obtained from the patient and all parties present prior to recording.      History of Present Illness  RONNEL [name] is a 78 year old male WITH A PAST MEDICAL HISTORY INCLUDING PAF, HYPERTENSION, HYPERLIPIDEMIA, CHRONIC OBSTRUCTIVE PULMONARY DISEASE, GOUT.    Patient's monitors that has blood pressure diligently at home, and brought in that has blood pressure for review, patient does have significant variability and blood pressure from 110s to 160s systolically.  Very few episodes of hypotension.  That has or blood pressure readings throughout the day mostly in the afternoon in the blood pressure seems to be higher.  However he does remain very physically active for for his age such as splitting firewood.    That has expressing no concerns today.  Although does report mono and intermittent chest discomfort 1 that has scale of 10.  We will brief.  That has sublingual nitro at home but does not use them as reports with the pain that has not we will require intervention and resolves on its own. denies any new pattern or association with the exertion or worsening chest pain, shortness of breath, or palpitations.  That has history of CAD use CVA.    Currently on flecainide  and diltiazem  and Eliquis  were as paroxysmal atrial fibrillation.  Compliant with the medications.  No significant bleeding issues on Eliquis .  No anemia on recent lab work.    He receives meals on wheels and tries to maintain a regular diet, tries avoiding added salt. He uses hot sauce as a substitute for salt but is  unaware of its sodium content. He denies using NSAIDs, opting for acetaminophen at night.    He has a history of leg swelling for the past ten years, which improves with elevation. He was previously on hydrochlorothiazide but has been on furosemide  for a long time. His leg swelling is improved in the morning.  No orthopnea, PND, or other symptoms of heart failure.      Review of Systems   Constitutional: Negative for decreased appetite, malaise/fatigue, night sweats, weight gain and weight loss.   Cardiovascular:  Positive for irregular heartbeat. Negative for chest pain, dyspnea on exertion, leg swelling, near-syncope, orthopnea, palpitations, paroxysmal nocturnal dyspnea and syncope.   Respiratory:  Negative for shortness of breath.    Hematologic/Lymphatic: Does not bruise/bleed easily.   All other systems reviewed and are negative.        Current Outpatient Medications   Medication Sig    acetaminophen (TYLENOL) 500 mg Oral Tablet Take 1 Tablet (500 mg total) by mouth Every 4 hours as needed for Pain (pt takes tylenol pm)    alfuzosin  (UROXATRAL ) 10 mg Oral Tablet Sustained Release 24 hr Take 1 Tablet (10 mg total) by mouth Once a day    ELIQUIS  5 mg Oral Tablet TAKE 1 TABLET TWICE DAILY    finasteride  (PROSCAR ) 5 mg Oral Tablet Take 1  Tablet (5 mg total) by mouth Once a day    flecainide  (TAMBOCOR ) 100 mg Oral Tablet Take 1 Tablet (100 mg total) by mouth Twice daily    furosemide  (LASIX ) 40 mg Oral Tablet TAKE 1 TABLET EVERY DAY    losartan  (COZAAR ) 50 mg Oral Tablet Take 1 Tablet (50 mg total) by mouth Twice daily    Methylprednisolone  (MEDROL  DOSEPACK) 4 mg Oral Tablets, Dose Pack Take as instructed.    nitroGLYCERIN  (NITROSTAT ) 0.4 mg Sublingual Tablet, Sublingual Place 1 Tablet (0.4 mg total) under the tongue Every 5 minutes as needed for Chest pain    nystatin  (MYCOSTATIN ) 100,000 unit/mL Oral Suspension TAKE 5 MLS BY MOUTH FOUR TIMES A DAY AS NEEDED FOR THRUSH    potassium chloride  (K-DUR) 10 mEq Oral Tab  Sust.Rel. Particle/Crystal TAKE 1 TABLET ONE TIME DAILY    rosuvastatin  (CRESTOR ) 10 mg Oral Tablet Take 1 Tablet (10 mg total) by mouth Every evening    SPIRIVA  WITH HANDIHALER 18 mcg Inhalation Capsule, w/Inhalation Device INHALE THE CONTENTS OF 1 CAPSULE EVERY DAY       Allergies[1]  Family Medical History:       Problem Relation (Age of Onset)    Cancer Mother    Hypertension (High Blood Pressure) Mother, Father            Social History     Socioeconomic History    Marital status: Widowed   Tobacco Use    Smoking status: Former     Types: Cigars    Smokeless tobacco: Current     Types: Chew, Snuff   Vaping Use    Vaping status: Never Used   Substance and Sexual Activity    Alcohol use: Yes     Alcohol/week: 2.0 standard drinks of alcohol     Types: 2 Cans of beer per week     Comment: few days a week    Drug use: Never    Sexual activity: Not Currently     Social Determinants of Health     Financial Resource Strain: Low Risk (03/31/2024)    Financial Resource Strain     SDOH Financial: No   Transportation Needs: Low Risk (03/31/2024)    Transportation Needs     SDOH Transportation: No   Social Connections: Low Risk (03/31/2024)    Social Connections     SDOH Social Isolation: 5 or more times a week   Intimate Partner Violence: Low Risk (03/31/2024)    Intimate Partner Violence     SDOH Domestic Violence: No   Housing Stability: Low Risk (03/31/2024)    Housing Stability     SDOH Housing Situation: I have housing.     SDOH Housing Worry: No         Physical Exam:  BP 122/82 (Site: Left Arm, Patient Position: Sitting)   Pulse 55   Ht 1.778 m (5' 10)   Wt 114 kg (251 lb)   SpO2 95%   BMI 36.01 kg/m       Body mass index is 36.01 kg/m.  Wt Readings from Last 5 Encounters:   03/31/24 114 kg (251 lb)   03/31/24 114 kg (251 lb)   02/21/24 109 kg (240 lb)   12/21/23 116 kg (256 lb 3.2 oz)   11/23/23 116 kg (255 lb 3.2 oz)     Physical Exam  Vitals and nursing note reviewed.   Constitutional:       General: He  is not in acute distress.  Appearance: Normal appearance. He is obese. He is not ill-appearing or toxic-appearing.   Neck:      Vascular: Hepatojugular reflux present. No carotid bruit or JVD.   Cardiovascular:      Rate and Rhythm: Regular rhythm. Bradycardia present.      Pulses: Normal pulses.           Radial pulses are 2+ on the right side and 2+ on the left side.      Heart sounds: Normal heart sounds. No murmur heard.     No friction rub. No gallop.   Pulmonary:      Effort: Pulmonary effort is normal.      Breath sounds: Normal breath sounds.   Musculoskeletal:      Right lower leg: No edema.      Left lower leg: No edema.   Skin:     General: Skin is warm and dry.   Neurological:      General: No focal deficit present.      Mental Status: He is alert and oriented to person, place, and time.          PERTINENT DIAGNOSTICS:    Lab Results   Component Value Date    CHOLESTEROL 135 02/16/2024    HDLCHOL 62 02/16/2024    LDLCHOL 61 02/16/2024    TRIG 58 02/16/2024       Last BMP  (Last result in the past 2 years)        Na   K   Cl   CO2   BUN   Cr   Calcium   Glucose   Glucose-Fasting        02/16/24 0830 144   3.9   107   32   18   0.87   8.6  Comment: Gadolinium-containing contrast can interfere with calcium measurement.     91                  Last CBC  (Last result in the past 2 years)        WBC   HGB   HCT   MCV   Platelets      02/16/24 0830 6.5   15.2   46.5   95.3   141               ECHO  Results for orders placed during the hospital encounter of 12/24/21    TRANSTHORACIC ECHOCARDIOGRAM - ADULT 12/24/2021  2:58 PM    Narrative  **See full report in linked PDF document**    Version: 1    +--------------------------------------+  :                                      :  +--------------------------------------+  [address] [place] [address]    Transthoracic Echocardiographic Report    ______________________________________________________________________________  Name: JCEON, ALVERIO                             MRN: [id number]                                   Weight: 267.004 lb  Study Date: 12/24/2021, 2: 29 PM  DOB: [birth year]                                 Height: 71 in  Gender: Male                                                                                       BSA: 2.38 m2  Patient Location: [facility]  Referring Physician: STOUT, HEIDI  Ordering Physician: STOUT, HEIDI  Tech: RDCS JAIME GOODWIN  ______________________________________________________________________________  Technical Quality:: The study images were of technically adequate quality.  Quality: The study images were of technically adequate quality.    Reason For Study: Atrial flutter, unspecified type (CMS HCC),Essential hypertension,DOE (dyspnea on exertion)    Conclusions  NSR at HR of 40s throughout.  Normal biventricular size and function.  Normal atrial sizes.  No significant valvular pathology is noted.  Other findings as below.  No previous echo images are available for comparison    Findings:  Procedure: Transthoracic complete echo, 2D, spectral and tissue Doppler, color flow Doppler, M-mode.  Left Ventricle: Normal left ventricular size. Normal geometry. Mild left ventricular hypertrophy. Left ventricular systolic function is normal. The left ventricular ejection fraction by visual  assessment is estimated to be 55%. No segmental/regional wall motion abnormalities identified. Left ventricular diastolic parameters are normal.  Right Ventricle: Normal right ventricular size. Normal right ventricular systolic function. Right ventricular systolic pressure is normal. Right ventricular systolic pressure is .  Left Atrium: The left atrium is normal in size.  Right Atrium: The right atrium is of normal size.  Mitral valve: Mild mitral annular calcification. There is mild mitral regurgitation.  Tricuspid valve: The tricuspid valve is normal. Trace tricuspid regurgitation present.  Aortic  valve: The aortic valve is normal. Trileaflet aortic valve.  Pulmonic valve: The pulmonic valve is normal. Trace pulmonic valve regurgitation present.  Pulmonary Artery:: The pulmonary artery appears normal.  Atrial Septum: The interatrial septum is normal in appearance.  IVC: Normal IVC size with >50% inspiratory collapse (estimated RA pressure 3 mmHg).  Aorta: The aortic root is of normal size.  Pericardium: Normal pericardium with no pericardial effusion.    2D/ M Mode                                                                        Doppler  LVIDd: 5.0 cm                             F: (3.8-5.2)/ M: (4.2-5.8)              AV Peak Vel: 175.0 cm/sec               (100-170  LVIDs: 3.1 cm  F: (2.2-3.5/ M: 2.5-4.0)                                                       (70-90)  IVSd: 1.09 cm                             F: (0.6-0.9)/ M: (0.6-1.0               AV max PG: 12.0 mmHg                   (2.0-9.0)  AV Mean PG: 6.0 mmHg                   (2.0-4.0  LVPWd: 1.05 cm                           F: (0.6-0.9)/ M: (0.6-1.0)              AVA Vmax): 3.1 cm2  AVA VTI: 3.2 cm2  AV DI (VTI): 0.91  AV DI (vel): 0.89  LV Mass: 200.7 grams                      F: (67-162)/ M: (88-224)  LV Mass Index: 84.2 grams/m2              F: (43-95)/ M: (49-115)                 LVOT diam: 2.10 cm  RWT: 0.42                                                                        LVOT Peak Vel: 156.0 cm/sec  LA dimension: 4.3 cm                      F: (2.7-3.8)/ M: (3.0-4.0)  LAV (MOD-bp): 76.1 ml                    F: (1.5-2.3)/ M: (1.5-2.3)              LVOT Peak PG: 9.7 mmHg  F: (8-24)/ M: (11-31)                   LVOT Mean PG: 5.0 mmHg  F: (1.5-2.3)/ M: (1.5-2.3)              LVOT VTI: 38.6 cm  F: (8-24)/ M: (11-31)  RA Area 4ch: 17.0 cm2                     F: (15-27)/ M: (18-32)                  AV VTI: 42.2 cm  F: (8-20)/ M: (10-24)  F: (4.5-11)/ M: (5-12.6)                AV VR: 0.89  F: (1.6-6.4)/ M:  (2.0-7.4)  42-56  SV(LVOT): 133.7 ml  F: (32-74)/ M: (36-87)                   SI(LVOT): 56.1 ml/m2  F: (8-36)/ M: (10-44)  20.5-27.5  F: (46-106)/ M: (62-150)  F: (14-42)/ M: (21-61)                  MV E Peak Vel: 112.0 cm/sec  MV A Peak Vel: 116.0 cm/sec  MV Decel time: 0.18 sec  F: (54-74)/ M: (52-72)  EDV(MOD-sp4): 118.0 ml                                                           Lat Peak E' Vel: 8.6 cm/sec  Lat E/E': 13.0  Med Peak E' Vel: 8.4 cm/sec  ESV(MOD-sp4): 44.9 ml                                                            Med E/E': 13.4  MV V max: 119.0 cm/sec  LV FS: 39.1 %  AoR Diam: 3.4 cm                         F:: (2.7-3.3)/ M: (3.1-3.7)  F: (2.3-2.9)/ M: (2.6-3.2)              MV VTI: 55.6 cm  F: (2.3-2.9)/ M: (2.6-3.2)              MVA (VTI): 2.40 cm2  MV V max: 119.0 cm/sec  MV dec slope: 622.0 cm/sec2  TR Vmax: 250.0 cm/sec  TR Peak PG: 25.0 mmHg  IVC Diam: 1.62 cm    ______________________________________________________________________________  Electronically signed by: SUSA MORTIMER, MD 12/24/2021, 5: 54 PM       Cath  No results found for this or any previous visit.     IMPRESSION:  Assessment/Plan   1. Paroxysmal atrial flutter (CMS HCC)    2. Mixed hyperlipidemia    3. Essential hypertension        Assessment & Plan  PAF on flecainide  antiarrhythmic therapy  Intermittent irregular heartbeats - noted on by patient's home blood pressure machine, not felt by patient.  - EKG shows sinus rhythm with a first-degree block, normal QTC. No PVCs or PACs benign. Flecainide  effective in maintaining sinus rhythm.    - Continue flecainide  100 mg twice daily.  - stop diltiazem  for bradycardia  - No bleeding on Eliquis , continue Eliquis .  Discussed signs symptoms of bleeding and went to seek medical treatment.  - Order echocardiogram.    CHA2DS2-VASc Stroke Risk Points  Current as of 5 minutes ago        3 0 to 9 Points:      No Change          The CHA2DS2-VASc risk  score (Lip GH, et al. 2009.  2010 American College of Chest Physicians) quantifies the risk of stroke for a patient with atrial fibrillation. For patients without atrial fibrillation or under the age of 35 this score appears as N/A. Higher score values generally indicate higher risk of stroke.  Points Metrics   0 Has Congestive Heart Failure:  No     Patients with congestive heart failure get 1 point.    Current as of 5 minutes ago   1 Has Hypertension:  Yes     Patients with hypertension get 1 point.    Current as of 5 minutes ago   2 Age:  17     Patients 49 to 79 years old get 1 point, or patients 75 years and older get 2 points.    Current as of 5 minutes ago   0 Has Diabetes:  No     Patients with diabetes get 1 point.    Current as of 5 minutes ago   0 Had Stroke:  No  Had TIA:  No  Had Thromboembolism:  No     Patients who have had a stroke, TIA, or thromboembolism get 2 points.    Current as of 5 minutes ago   0 Has Vascular Disease:  No     Patients with vascular disease get 1 point.    Current as of 5 minutes ago   0 Clinically Relevant Sex:  Male     Patients with a clinically relevant sex of Male get 1 point.    Current as of 5 minutes ago                Hypertension  Variable Blood pressure readings in the office, on my evaluation 2 attempts both elevated at 138/82 mmHg.  Patient is also has a elevated blood pressure readings on his home BP list  - increase losartan  100 mg daily divided b.i.d.SABRA  - continue Lasix  and potassium, potassium of 3.9 on recent bmp.  - Encourage dietary modifications to reduce sodium intake.  - last echocardiogram showing LVH, we will repeat echocardiogram    Lower extremity edema  Chronic edema with no significant changes.  Resolves with a elevation overnight  - Improves with leg elevation.  - Continue furosemide  40 mg daily.  - repeat echocardiogram      Hyperlipidemia  Cholesterol levels desirable  - Continue Crestor  10 mg daily.  - lipids per PCP  monitoring        Orders Placed This Encounter    TRANSTHORACIC ECHOCARDIOGRAM - ADULT COMPLETE    losartan  (COZAAR ) 50 mg Oral Tablet     Return in about 6 months (around 09/29/2024). Or as needed. Notify cardiology office with questions or concerns.  Chart sent to Dr. Norval for review.  He was readily available at time of office visit for discussion regarding this patient.  Thana Backer, APRN, CNP   03/31/2024 15:46                        [1]   Allergies  Allergen Reactions    Carvedilol   Other Adverse Reaction (Add comment)     decreased heart rate    Lisinopril   Other Adverse Reaction (Add comment)     cough    Flomax  [Tamsulosin ] Nausea/ Vomiting    Hydralazine   Other Adverse Reaction (Add comment)     Leg cramps, fatigue    Losartan   Other Adverse Reaction (Add comment)     Cough, but able to tolerate

## 2024-03-31 NOTE — Patient Instructions (Signed)
 NURSING VISIT IN OFFICE 2 WEEKS FOR BLOOD PRESSURE CHECK.  BRING HOME BLOOD PRESSURE MONITOR.

## 2024-04-03 ENCOUNTER — Telehealth: Payer: Self-pay | Admitting: Internal Medicine

## 2024-04-03 NOTE — Telephone Encounter (Signed)
 Ok for [employer]

## 2024-04-03 NOTE — Telephone Encounter (Signed)
 Called and LVM on secure line

## 2024-04-03 NOTE — Telephone Encounter (Signed)
 Message was sent to [facility] in error. I am forwarding to patient's pcp for follow up.  Copied from CRM #[id number]. Topic: Clinical - Home Health Verbal Orders >> Mar 31, 2024  2:27 PM DeAngela L wrote: Caller/Agency: Zachariah Occupational Therapist calling with [facility]  Callback Number: [phone] secured line  Any new concerns about the patient? This is an old concern but more bigger and more swollen than normal on the lower right extremity the patient is really concerned for his right knee

## 2024-04-03 NOTE — Telephone Encounter (Signed)
 Can we call patient and find out if he needs visit for knee

## 2024-04-05 ENCOUNTER — Telehealth (INDEPENDENT_AMBULATORY_CARE_PROVIDER_SITE_OTHER): Payer: Self-pay

## 2024-04-05 DIAGNOSIS — F3341 Major depressive disorder, recurrent, in partial remission: Secondary | ICD-10-CM

## 2024-04-05 DIAGNOSIS — F4321 Adjustment disorder with depressed mood: Secondary | ICD-10-CM

## 2024-04-05 NOTE — Telephone Encounter (Signed)
 Called patient and was able to get him scheduled for a visit with PCP to be assessed on the right knee

## 2024-04-05 NOTE — Progress Notes (Signed)
 [place] [facility]   [facility]  Outpatient Progress Note    TELEMEDICINE DOCUMENTATION:    Patient Location:  MyChart video visit from home address: [address]  [place] [address]    Patient/family aware of provider location:  yes  Patient/family consent for telemedicine:  yes  Examination observed and performed by:  Thomas [name], LICSW       Client: Thomas [name]  Medical Record Number: [id number]  Payor Source: Payor: [employer] MEDICARE / Plan: [facility] CHOICE PPO / Product Type: PPO /   Date: 04/05/2024   CPT Code: 09162  SESSION Information      Start Time: 9:06AM  End Time: 10:02AM    Subjective:   Thomas [name] presents today for an individual therapy session to continue addressing ongoing struggles related to loneliness due multiple losses. He denies any current symptoms as he states he has been feeling better. Current stressors he is experiencing include: ongoing loneliness. [name] presents stating that he is feeling better since last session as he described topic from last session resonated with him that he had been blaming God for his losses. He states he has since made a list of whom he has lost and is working through his grief with each one. He admits this felt cathartic. He admits he has cut back his drinking some, but did have a fall recently where he missed his chair while having a few beers. He denies there being a pattern and was not injured.    Objective:   Appearance: dressed appropriately   Mood: euthymic  Affect: congruent to mood   Behavior: Good eye contact and Good participation   Suicidal Ideation/Homicidal Ideation: Denies    Assessment:     ICD-10-CM    1. Recurrent major depressive disorder, in partial remission (CMS HCC)  F33.41       2. Grief  F43.21           Procedures:    Client attended an individual therapy session. Developing and using coping skills and developing and using relaxation techniques. Continued to build therapeutic rapport being the first  therapy session following intake.  Worked on managing and being with grief in order to move through the grief to acceptance.  He admits not being sure whether he is blocking his grief, but is open to working on this.  He states still having the ashes of his deceased wife in the kitchen and is uncertain with what he wants to do with them. He expressed intent to talk to his daughter about this.      Goals:   To learn healthy coping skills to effectively manage loneliness, grief and depression, developing and using coping skills, developing and using relaxation techniques, and developing and using containment strategies.       PLAN:    Review coping mechanisms used next session.  Return to clinic in 2 weeks or sooner if needed.     Thomas [name], MSW, LICSW  Clinical Therapist  [facility]  04/05/2024, 09:07  Late Entry for: 04/05/2024

## 2024-04-07 ENCOUNTER — Encounter (INDEPENDENT_AMBULATORY_CARE_PROVIDER_SITE_OTHER): Payer: Self-pay | Admitting: Medical

## 2024-04-11 ENCOUNTER — Other Ambulatory Visit (INDEPENDENT_AMBULATORY_CARE_PROVIDER_SITE_OTHER): Payer: Self-pay

## 2024-04-11 DIAGNOSIS — Z7189 Other specified counseling: Secondary | ICD-10-CM

## 2024-04-11 NOTE — Nursing Note (Signed)
 POPULATION HEALTH    COMPLEX CARE MANAGEMENT    Chronic Care Management - CCM  Status: Enrolled  Effective Dates: 01/10/2024 - present  Responsible Staff: Orien Mayhall, RN          Patient reports to be doing well. He has had some medication changes losartan  50 bid and stop Cardizem . Patient BP has been elevated since changes at times 167 or 145. Patient to have BP checks on Friday by RN. Patient denies any other recent Chest pain. Patient did note he fell and hit his head a couple of weeks ago. He has a headache on and off, [facility] offered support and or further testing, patient declined at this time. He will let [facility] if it continues. Patient Confirmed upcoming ECHO and therapy appointments. Last encounter patient was feeling a bit down he did receive out patient therapy information and established with tele appointments through Behavioral medicine. Patient has had a few sessions and he feels a lot better. Patient was instructed if he ever feels lightheaded of dizzy to not stand up take some deep breaths drink some liquids prior to standing or changing positions. Patient has no medication needs at this time. [facility] will continue to follow patient.     Care Management Tasks Completed:  Chart review completed.  Social determinants of health reviewed and updated as needed.  The following Care plan Anxiety, cardiac, HTN were reviewed/discussed/updated.  Interventions ongoing and updated as needed.  Goals addressed and updated.  Interventions ongoing and updated as needed.  The following Education on Hypotension/ Hypertension/ Diet/ Activity, cardiac were  completed.  Self management plan reviewed and updated.  See patient care coordination note.  Barriers to health, care plan, and goals reviewed.  Interventions ongoing and updated as needed.  The following Health maintenance/care gaps on vaccinations were  reviewed and discussed with patient.  Health maintenance/care gaps updated as applicable.  Discussed upcoming  appointment dates and times.  Reinforced importance of keeping all provider appointments.  Reinforced the importance of taking all medications as prescribed.  Medical history, surgical history, hospitalizations, and medications reviewed and updated as applicable.    Ongoing and Updated Assessment Interventions:  Encouraged activity  Discussed fall   Encouraged BP Checks  Discussed plan of care when dizzy or lightheaded  Confirmed upcoming appointments  Reviewed cardiac status  Discussed medication changes  Reviewed Behavorial therapy sessions     Case Manager To Do List for the Next Interaction:  Monthly follow up  F/U BP   F/U Medications  F/U Cardio  F/U Therapy sessions  F/U General health     Plan to call patient in ~ one month to reassess and update plan of care.  Instructed patient to call with change in symptoms or as needed prior to next follow up.      Ryan Adler, RN

## 2024-04-12 ENCOUNTER — Other Ambulatory Visit (INDEPENDENT_AMBULATORY_CARE_PROVIDER_SITE_OTHER): Payer: Self-pay | Admitting: Family Medicine

## 2024-04-12 NOTE — Telephone Encounter (Signed)
 Last Visit:02/21/2024     Upcoming appointments: 06/21/2024           Lyle Coad, MA  04/12/2024, 11:19

## 2024-04-13 ENCOUNTER — Other Ambulatory Visit: Payer: Self-pay

## 2024-04-14 ENCOUNTER — Other Ambulatory Visit: Payer: Self-pay

## 2024-04-14 ENCOUNTER — Other Ambulatory Visit (INDEPENDENT_AMBULATORY_CARE_PROVIDER_SITE_OTHER): Payer: Self-pay | Admitting: PHYSICIAN ASSISTANT

## 2024-04-14 ENCOUNTER — Ambulatory Visit: Payer: Self-pay

## 2024-04-14 ENCOUNTER — Other Ambulatory Visit (INDEPENDENT_AMBULATORY_CARE_PROVIDER_SITE_OTHER): Payer: Self-pay

## 2024-04-14 DIAGNOSIS — F338 Other recurrent depressive disorders: Secondary | ICD-10-CM

## 2024-04-14 DIAGNOSIS — Z7189 Other specified counseling: Secondary | ICD-10-CM

## 2024-04-14 DIAGNOSIS — E782 Mixed hyperlipidemia: Secondary | ICD-10-CM

## 2024-04-14 DIAGNOSIS — I10 Essential (primary) hypertension: Secondary | ICD-10-CM

## 2024-04-14 DIAGNOSIS — Z0130 Encounter for examination of blood pressure without abnormal findings: Secondary | ICD-10-CM

## 2024-04-14 MED ORDER — LOSARTAN 50 MG TABLET: ORAL_TABLET | ORAL | 3 refills | Status: DC

## 2024-04-14 NOTE — Progress Notes (Signed)
 Dr. Lauraine Plants, DO  ,    This patient has met the requirements to bill for Chronic Care Management services this month. Please sign this encounter as you typically would any other encounter. There is NO SPECIAL ATTESTATION needed . COSIGN ONLY. Please let us  know if you have any questions at all.    Chronic Care Management Time Documentation on 04/14/2024 11:34.   Time spent during current encounter is 1 minutes.   Cumulative time during current month's episode (month-to-date) is 40 minutes.          ICD-10-CM    1. Encounter for counseling for care management of patient with chronic conditions and complex health needs using nurse-based model  Z71.89       2. Seasonal depression (CMS HCC)  F33.8       3. Essential hypertension  I10       4. Mixed hyperlipidemia  E78.2             acetaminophen (TYLENOL) 500 mg Oral Tablet, Take 1 Tablet (500 mg total) by mouth Every 4 hours as needed for Pain (pt takes tylenol pm)  alfuzosin  (UROXATRAL ) 10 mg Oral Tablet Sustained Release 24 hr, Take 1 Tablet (10 mg total) by mouth Once a day  ELIQUIS  5 mg Oral Tablet, TAKE 1 TABLET TWICE DAILY  finasteride  (PROSCAR ) 5 mg Oral Tablet, Take 1 Tablet (5 mg total) by mouth Once a day  flecainide  (TAMBOCOR ) 100 mg Oral Tablet, Take 1 Tablet (100 mg total) by mouth Twice daily  furosemide  (LASIX ) 40 mg Oral Tablet, TAKE 1 TABLET EVERY DAY  losartan  (COZAAR ) 50 mg Oral Tablet, Take 1 Tablet (50 mg total) by mouth Twice daily  Methylprednisolone  (MEDROL  DOSEPACK) 4 mg Oral Tablets, Dose Pack, Take as instructed.  nitroGLYCERIN  (NITROSTAT ) 0.4 mg Sublingual Tablet, Sublingual, Place 1 Tablet (0.4 mg total) under the tongue Every 5 minutes as needed for Chest pain  nystatin  (MYCOSTATIN ) 100,000 unit/mL Oral Suspension, TAKE 5 MLS BY MOUTH FOUR TIMES A DAY AS NEEDED FOR THRUSH  potassium chloride  (K-DUR) 10 mEq Oral Tab Sust.Rel. Particle/Crystal, TAKE 1 TABLET ONE TIME DAILY  rosuvastatin  (CRESTOR ) 10 mg Oral Tablet, Take 1 Tablet (10 mg  total) by mouth Every evening  SPIRIVA  WITH HANDIHALER 18 mcg Inhalation Capsule, w/Inhalation Device, INHALE THE CONTENTS OF 1 CAPSULE EVERY DAY    No facility-administered medications prior to visit.        Thanks,  Your Chronic Care Management Team

## 2024-04-14 NOTE — Progress Notes (Signed)
 Patient here for BP check. Manual BP 144/78 and patient home BP monitor reading 166/89. Patient states he is compliant with all his medications. Discussed with Al Backer, CRNP and he wants losartan  increased to 100 mg in am and 50 mg in pm and for patient to obtain a new home BP monitor. Patient updated on POC and is agreeable. All questions answered and he verbalized understanding.

## 2024-04-14 NOTE — Telephone Encounter (Signed)
 REVIEWED ACTIVE MEDICATIONS. PARAMETERS FOR REFILL MET PER DEPARTMENTAL REFILL PROTOCOL. REFILL SENT ELECTRONICALLY TO PHARMACY. ORDER FORWARDED TO PROVIDER TO CO-SIGN.  Pleasant Fantasia, RN

## 2024-04-17 ENCOUNTER — Ambulatory Visit (HOSPITAL_COMMUNITY): Payer: Self-pay | Admitting: Student in an Organized Health Care Education/Training Program

## 2024-04-17 ENCOUNTER — Ambulatory Visit (INDEPENDENT_AMBULATORY_CARE_PROVIDER_SITE_OTHER): Admitting: Internal Medicine

## 2024-04-17 ENCOUNTER — Encounter: Payer: Self-pay | Admitting: Internal Medicine

## 2024-04-17 VITALS — BP 130/60 | HR 73 | Temp 98.5°F | Ht 64.0 in | Wt 223.0 lb

## 2024-04-17 DIAGNOSIS — T8453XD Infection and inflammatory reaction due to internal right knee prosthesis, subsequent encounter: Secondary | ICD-10-CM

## 2024-04-17 DIAGNOSIS — Z23 Encounter for immunization: Secondary | ICD-10-CM

## 2024-04-17 LAB — CBC WITH DIFFERENTIAL/PLATELET
Basophils Absolute: 0.1 K/uL (ref 0.0–0.1)
Basophils Relative: 0.7 % (ref 0.0–3.0)
Eosinophils Absolute: 0.3 K/uL (ref 0.0–0.7)
Eosinophils Relative: 3.8 % (ref 0.0–5.0)
HCT: 33.3 % — ABNORMAL LOW (ref 39.0–52.0)
Hemoglobin: 10.4 g/dL — ABNORMAL LOW (ref 13.0–17.0)
Lymphocytes Relative: 18 % (ref 12.0–46.0)
Lymphs Abs: 1.5 K/uL (ref 0.7–4.0)
MCHC: 31.2 g/dL (ref 30.0–36.0)
MCV: 72 fl — ABNORMAL LOW (ref 78.0–100.0)
Monocytes Absolute: 0.6 K/uL (ref 0.1–1.0)
Monocytes Relative: 7 % (ref 3.0–12.0)
Neutro Abs: 6 K/uL (ref 1.4–7.7)
Neutrophils Relative %: 70.5 % (ref 43.0–77.0)
Platelets: 227 K/uL (ref 150.0–400.0)
RBC: 4.63 Mil/uL (ref 4.22–5.81)
RDW: 20.2 % — ABNORMAL HIGH (ref 11.5–15.5)
WBC: 8.5 K/uL (ref 4.0–10.5)

## 2024-04-17 LAB — C-REACTIVE PROTEIN: CRP: 1.5 mg/dL (ref 0.5–20.0)

## 2024-04-17 LAB — SEDIMENTATION RATE: Sed Rate: 33 mm/h — ABNORMAL HIGH (ref 0–20)

## 2024-04-17 NOTE — Progress Notes (Signed)
 Subjective:   Patient ID: Tanner [name], male    DOB: 1946/01/29, 78 y.o.   MRN: [id number]  Discussed the use of AI scribe software for clinical note transcription with the patient, who gave verbal consent to proceed.  History of Present Illness Tanner [name] is a 78 year old male with a history of knee replacement surgery who presents with knee swelling and instability.  He has significant swelling in his knee, which is visibly larger compared to the other knee. The knee had previously been infected but had returned to near normal size before it began to swell again. The swelling has progressively worsened over time.  He describes mild pain in the knee, primarily when touched, and notes that the redness has extended down the leg. He continues to take doxycycline  but is concerned about the knee's temperature and hardness, as noted by home health aides. No fevers, chills, or systemic symptoms.  The knee's instability is a major issue, as it frequently collapses, making it difficult for him to walk even short distances, such as from the kitchen to the living room. He recalls that after the initial knee replacement surgery, the hardware was swapped out in an attempt to address the infection. Despite these efforts, he remains on suppressive antibiotics to prevent further bacterial growth.  He is frustrated with his limited mobility, stating that he cannot walk on the knee due to pain and instability. He mentions using a walker but still struggles with basic movements around the house. No new respiratory or gastrointestinal symptoms.  Review of Systems  Constitutional: Negative.   HENT: Negative.    Eyes: Negative.   Respiratory:  Negative for cough, chest tightness and shortness of breath.   Cardiovascular:  Negative for chest pain, palpitations and leg swelling.  Gastrointestinal:  Negative for abdominal distention, abdominal pain, constipation, diarrhea, nausea and vomiting.   Musculoskeletal:  Positive for arthralgias, gait problem and joint swelling.  Skin: Negative.   Psychiatric/Behavioral: Negative.      Objective:  Physical Exam Constitutional:      Appearance: He is well-developed.  HENT:     Head: Normocephalic and atraumatic.  Cardiovascular:     Rate and Rhythm: Normal rate and regular rhythm.  Pulmonary:     Effort: Pulmonary effort is normal. No respiratory distress.     Breath sounds: Normal breath sounds. No wheezing or rales.  Abdominal:     General: Bowel sounds are normal. There is no distension.     Palpations: Abdomen is soft.     Tenderness: There is no abdominal tenderness.  Musculoskeletal:        General: Tenderness present.     Cervical back: Normal range of motion.     Comments: Right knee swollen with some heat and mild redness overall stable from prior  Skin:    General: Skin is warm and dry.  Neurological:     Mental Status: He is alert and oriented to person, place, and time.     Coordination: Coordination abnormal.     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 Previous interventions have been ineffective  with PT. He is on doxycycline  for suppression. He is open to a second surgical opinion. Ordered blood work to assess inflammatory markers CRP and ESR and CBC w/diff. Referred to a different orthopedic surgeon for a second opinion.

## 2024-04-17 NOTE — Patient Instructions (Signed)
 We are checking the labs today and will get you in with a second opinion surgeon about the knee.

## 2024-04-18 ENCOUNTER — Ambulatory Visit: Payer: Self-pay | Admitting: Internal Medicine

## 2024-04-19 ENCOUNTER — Telehealth: Payer: Self-pay

## 2024-04-19 ENCOUNTER — Other Ambulatory Visit: Payer: Self-pay

## 2024-04-19 ENCOUNTER — Ambulatory Visit
Admission: RE | Admit: 2024-04-19 | Discharge: 2024-04-19 | Disposition: A | Discharge status: Home / Self Care | Payer: Self-pay | Source: Ambulatory Visit | Attending: PHYSICIAN ASSISTANT | Admitting: PHYSICIAN ASSISTANT

## 2024-04-19 DIAGNOSIS — I4892 Unspecified atrial flutter: Secondary | ICD-10-CM | POA: Insufficient documentation

## 2024-04-19 DIAGNOSIS — E782 Mixed hyperlipidemia: Secondary | ICD-10-CM | POA: Insufficient documentation

## 2024-04-19 DIAGNOSIS — I10 Essential (primary) hypertension: Secondary | ICD-10-CM | POA: Insufficient documentation

## 2024-04-19 DIAGNOSIS — I340 Nonrheumatic mitral (valve) insufficiency: Secondary | ICD-10-CM

## 2024-04-19 DIAGNOSIS — I519 Heart disease, unspecified: Secondary | ICD-10-CM

## 2024-04-19 LAB — TRANSTHORACIC ECHOCARDIOGRAM - ADULT
EF VISUAL ESTIMATE: 55
EF: 60

## 2024-04-19 NOTE — Telephone Encounter (Signed)
 Called and left message for pt to call office to notify him of results. Per Dr. Rollene, there were no signs of flare in the infection in the knee.

## 2024-04-19 NOTE — Telephone Encounter (Signed)
 Copied from CRM [id number]. Topic: General - Other >> Apr 19, 2024  1:40 PM Donna BRAVO wrote: Reason for CRM: patient returning message patient does not know what it is about I routed this to the wrong office please disregard. Needs to be routed to [facility] at the office who stated she could not see who had called the patient.

## 2024-04-20 ENCOUNTER — Telehealth (INDEPENDENT_AMBULATORY_CARE_PROVIDER_SITE_OTHER): Payer: Self-pay

## 2024-04-20 DIAGNOSIS — F4321 Adjustment disorder with depressed mood: Secondary | ICD-10-CM

## 2024-04-20 DIAGNOSIS — F3341 Major depressive disorder, recurrent, in partial remission: Secondary | ICD-10-CM

## 2024-04-20 NOTE — Progress Notes (Signed)
 [place]  [facility]  Outpatient Progress Note    TELEMEDICINE DOCUMENTATION:    Patient Location:  MyChart video visit from home address: [address]  [place] [address]    Patient/family aware of provider location:  yes  Patient/family consent for telemedicine:  yes  Examination observed and performed by:  Thomas [name], LICSW       Client: Thomas [name]  Medical Record Number: [id number]  Payor Source: Payor: [employer] MEDICARE / Plan: [facility] CHOICE PPO / Product Type: PPO /   Date: 04/20/2024   CPT Code: 09162  SESSION Information      Start Time: 9:06AM  End Time: 9:58AM    Subjective:   Thomas [name] presents today for an individual therapy session to continue addressing ongoing struggles related to loneliness due multiple losses. He denies any current symptoms as he states he has been feeling better. Current stressors he is experiencing include: ongoing loneliness. [name] presents stating that he monitored his drinking over the last 16 days and saw where he drank 9 of those days.  He states that 5 of these days were social, 2 was while he watched football, and one day he was trying to decrease physical pain. He shares his mood has been somewhat less since last session which he contributes to being lonely at night and the recent time change.  He rates his mood between 5-6, from a scale 1-10, 10 being the best.     Objective:   Appearance: dressed appropriately   Mood: euthymic  Affect: congruent to mood   Behavior: Good eye contact and Good participation   Suicidal Ideation/Homicidal Ideation: Denies    Assessment:     ICD-10-CM    1. Recurrent major depressive disorder, in partial remission (CMS HCC)  F33.41       2. Grief  F43.21           Procedures:    Client attended an individual therapy session. Developing and using coping skills and developing and using relaxation techniques.  Continued to work on building his social circle and necessary skills to help achieve these  goals.  He was responsive to practicing skills.  He indicated he was going to start jounrnaling.    Goals:   To learn healthy coping skills to effectively manage loneliness, grief and depression, developing and using coping skills, developing and using relaxation techniques, and developing and using containment strategies.       PLAN:    Review coping mechanisms used next session.  Return to clinic in 2 weeks or sooner if needed.     Thomas [name], MSW, LICSW  Clinical Therapist  [facility]  04/20/2024, 09:04  Late Entry for: 04/20/2024

## 2024-04-21 ENCOUNTER — Other Ambulatory Visit (INDEPENDENT_AMBULATORY_CARE_PROVIDER_SITE_OTHER): Payer: Self-pay | Admitting: Family Medicine

## 2024-04-21 NOTE — Telephone Encounter (Signed)
 Last Visit:02/21/24    Upcoming appointments: Visit date not found           Lyle Coad, [place]  04/21/2024, 13:39

## 2024-04-27 ENCOUNTER — Ambulatory Visit: Admitting: Orthopedic Surgery

## 2024-04-27 ENCOUNTER — Other Ambulatory Visit (INDEPENDENT_AMBULATORY_CARE_PROVIDER_SITE_OTHER): Payer: Self-pay

## 2024-04-27 DIAGNOSIS — T8459XA Infection and inflammatory reaction due to other internal joint prosthesis, initial encounter: Secondary | ICD-10-CM

## 2024-04-27 DIAGNOSIS — M25561 Pain in right knee: Secondary | ICD-10-CM | POA: Diagnosis not present

## 2024-04-27 DIAGNOSIS — G8929 Other chronic pain: Secondary | ICD-10-CM | POA: Diagnosis not present

## 2024-04-27 DIAGNOSIS — Z96659 Presence of unspecified artificial knee joint: Secondary | ICD-10-CM

## 2024-04-27 DIAGNOSIS — J9601 Acute respiratory failure with hypoxia: Secondary | ICD-10-CM | POA: Diagnosis not present

## 2024-04-29 ENCOUNTER — Other Ambulatory Visit: Payer: Self-pay | Admitting: Internal Medicine

## 2024-05-01 ENCOUNTER — Ambulatory Visit (INDEPENDENT_AMBULATORY_CARE_PROVIDER_SITE_OTHER): Payer: Self-pay | Admitting: PHYSICIAN ASSISTANT

## 2024-05-01 ENCOUNTER — Encounter: Payer: Self-pay | Admitting: Orthopedic Surgery

## 2024-05-01 NOTE — Progress Notes (Signed)
 Office Visit Note   Patient: Tanner [name]           Date of Birth: Sep 02, 1945           MRN: [id number] Visit Date: 04/27/2024              Requested by: Rollene Almarie LABOR, MD [address] [place],  [place] [address] PCP: Rollene Almarie LABOR, MD  Chief Complaint  Patient presents with   Right Knee - Wound Check    12/15/2022 TKR with Dr. Ernie 03/2023 I&D poly exchange Dr. Ernie       HPI: Discussed the use of AI scribe software for clinical note transcription with the patient, who gave verbal consent to proceed.  History of Present Illness Tanner [name] is a 78 year old male with a history of MRSA infection in the right knee who presents with significant swelling and weakness in the right knee.  He has a history of MRSA infection in the right knee, which was treated during a hospital visit for pneumonia. The infection was addressed with a procedure, and he is currently on doxycycline  for ongoing management.  He describes significant swelling in the right knee, noting it is about twice the size of the other leg. The swelling has been persistent and is accompanied by redness. He experiences weakness in the knee, stating it 'just collapses' and he is unable to walk, necessitating the use of a wheelchair.  Living alone, he is concerned about his ability to perform strength training exercises independently. He has access to a gym but is unsure about his ability to drive and get in and out of a car.  He is frustrated with the current state of his knee, noting that the swelling and weakness have significantly impacted his daily life. He is concerned about the long-term implications of the MRSA infection and the potential need for further intervention.     Assessment & Plan: Visit Diagnoses:  1. Chronic pain of right knee   2. Infection of total knee replacement, initial encounter     Plan: Assessment and Plan Assessment & Plan Chronic right knee pain and  weakness following infected total knee arthroplasty with MRSA prosthetic joint infection Chronic right knee pain and weakness due to MRSA prosthetic joint infection. Significant swelling and 30-degree extensor lag. No palpable defect in extensor mechanism. Current treatment includes doxycycline . Discussed above-knee amputation, but significant strength improvement needed. Revision total knee arthroplasty unlikely to succeed. - Continue doxycycline  for MRSA infection. - Recommend daily strength training exercises to improve knee strength. - Discussed potential for above-knee amputation if strength does not improve significantly. - Re-evaluate in two months.  Impaired mobility and functional limitation due to right knee weakness Impaired mobility and functional limitation due to right knee weakness. Difficulty walking and standing due to weakness and swelling. Emphasized need for strength improvement before considering amputation, as prosthesis requires more energy. - Recommend daily strength training exercises, including standing up and down from a chair, starting with a higher chair and progressing to a lower chair. - Advise using a chair with arms for support during exercises. - Encourage gradual increase in exercise intensity, aiming for 10 repetitions per hour. - Discuss potential use of weights or a backpack to increase resistance during exercises.      Follow-Up Instructions: Return if symptoms worsen or fail to improve.   Ortho Exam  Patient is alert, oriented, no adenopathy, well-dressed, normal affect, normal respiratory effort. Physical Exam MUSCULOSKELETAL:  Significant swelling around the right total knee with MRSA. Thirty degree extensor lag in the right knee. No palpable defect in the extensor mechanism. Can hold a straight leg raise with a thirty degree extensor lag. Flexion past 120 degrees. Extremely weak with swelling.      Imaging: No results found. No images are  attached to the encounter.  Labs: Lab Results  Component Value Date   HGBA1C 5.6 01/24/2024   HGBA1C 5.3 03/22/2023   HGBA1C 4.8 12/02/2022   ESRSEDRATE 33 (H) 04/17/2024   ESRSEDRATE 13 12/11/2023   ESRSEDRATE 58 (H) 04/07/2023   CRP 1.5 04/17/2024   CRP 0.7 12/11/2023   CRP 13.4 (H) 04/07/2023   LABURIC 10.3 (H) 11/29/2017   REPTSTATUS 05/03/2023 FINAL 04/29/2023   GRAMSTAIN  04/29/2023    ABUNDANT WBC PRESENT, PREDOMINANTLY PMN NO ORGANISMS SEEN    CULT  04/29/2023    RARE CANDIDA ALBICANS WITHIN MIXED ORGANISMS Performed at [facility], [address], [place], [place] [address]    LABORGA METHICILLIN RESISTANT STAPHYLOCOCCUS AUREUS 04/07/2023     Lab Results  Component Value Date   ALBUMIN 3.3 (L) 01/24/2024   ALBUMIN 2.7 (L) 12/11/2023   ALBUMIN 3.6 07/13/2023   ALBUMIN 3.6 07/13/2023    Lab Results  Component Value Date   MG 2.0 12/12/2023   MG 1.9 04/28/2023   MG 1.7 04/27/2023   Lab Results  Component Value Date   VD25OH 49.73 12/04/2021   VD25OH 45.21 01/13/2021   VD25OH 51 04/15/2020    No results found for: PREALBUMIN    Latest Ref Rng & Units 04/17/2024    4:08 PM 01/24/2024   10:51 AM 12/13/2023    4:22 AM  CBC EXTENDED  WBC 4.0 - 10.5 K/uL 8.5  7.8  10.0   RBC 4.22 - 5.81 Mil/uL 4.63  4.13  4.59   Hemoglobin 13.0 - 17.0 g/dL 89.5  9.7  89.8   HCT 60.9 - 52.0 % 33.3  31.3  36.2   Platelets 150.0 - 400.0 K/uL 227.0  184.0  223   NEUT# 1.4 - 7.7 K/uL 6.0   6.5   Lymph# 0.7 - 4.0 K/uL 1.5   2.4      There is no height or weight on file to calculate BMI.  Orders:  Orders Placed This Encounter  Procedures   XR Knee 1-2 Views Right   No orders of the defined types were placed in this encounter.    Procedures: No procedures performed  Clinical Data: No additional findings.  ROS:  All other systems negative, except as noted in the HPI. Review of Systems  Objective: Vital Signs: There were no vitals taken for this  visit.  Specialty Comments:  No specialty comments available.  PMFS History: Patient Active Problem List   Diagnosis Date Noted   Cellulitis of buttock 12/12/2023   Pressure injury of buttock, stage 3 (HCC) 12/12/2023   Cellulitis and abscess of right leg 09/13/2023   Renal mass 05/05/2023   History of MRSA infection 04/24/2023   Lung nodule 04/24/2023   Generalized weakness 04/07/2023   Hyperlipidemia 04/07/2023   MRSA bacteremia 04/07/2023   Streptococcal bacteremia 04/07/2023   Infection of prosthetic right knee joint 04/06/2023   S/P total knee arthroplasty, right 12/15/2022   Paroxysmal atrial fibrillation (HCC) 08/19/2022   Jaw pain 06/24/2022   Left hand pain 05/29/2022   Chronic systolic CHF (congestive heart failure) (HCC) 02/23/2022   Moderate mitral regurgitation 02/23/2022   Leg  swelling 01/29/2022   ED (erectile dysfunction) 01/13/2021   Cyst, dermoid, scalp and neck 01/13/2021   Chronic kidney disease (CKD), stage III (moderate) (HCC) 12/13/2019   Carpal tunnel syndrome 12/05/2019   Aortic atherosclerosis 11/15/2019   Peripheral neuropathy 03/17/2017   DM2 (diabetes mellitus, type 2) (HCC) 12/11/2016   Allergic rhinitis 12/11/2016   Degenerative arthritis of left knee 01/30/2016   Morbid obesity (HCC) 01/30/2016   Routine general medical examination at a health care facility 04/20/2014   History of pulmonary embolus (PE) 12/19/2013   Anxiety state 03/29/2007   Depression 03/29/2007   OSA (obstructive sleep apnea) 03/29/2007   Unspecified glaucoma 03/29/2007   BPH (benign prostatic hyperplasia) 03/29/2007   OA (osteoarthritis) of knee 03/29/2007   Essential hypertension 03/29/2007   History of Bell's palsy 03/29/2007   Past Medical History:  Diagnosis Date   Acute kidney failure    Anxiety    Arthritis    Back pain    BPH (benign prostatic hypertrophy)    CHF (congestive heart failure) (HCC)    Clostridium difficile infection    Clotting disorder     Depression    Diabetes (HCC) 12/11/2016   type 2    DVT (deep venous thrombosis) (HCC)    DVT of deep femoral vein, right (HCC) 06/29/2018   Edema, lower extremity    High cholesterol    Hypertension    Joint pain    Low back pain potentially associated with spinal stenosis    Neuromuscular disorder (HCC)    Neuropathy of lower extremity    bilateral   OSA (obstructive sleep apnea)    pt denies    Osteoarthritis    PE (pulmonary embolism)    Pneumonia    hx of x 2    Ventral hernia     Family History  Problem Relation Age of Onset   Diabetes Mother    Pneumonia Mother    Alzheimer's disease Mother    Hyperlipidemia Mother    Thyroid  disease Mother    Depression Mother    Other Father 29       Drowned on boating accident   Colon cancer Neg Hx    Colon polyps Neg Hx     Past Surgical History:  Procedure Laterality Date   BRONCHIAL WASHINGS  04/29/2023   Procedure: BRONCHIAL WASHINGS;  Surgeon: Neda Jennet LABOR, MD;  Location: [facility];  Service: Pulmonary;;   CARDIOVERSION N/A 08/19/2022   Procedure: CARDIOVERSION;  Surgeon: Hobart Powell BRAVO, MD;  Location: [facility];  Service: Cardiovascular;  Laterality: N/A;   EYE SURGERY     HERNIA REPAIR  1999   I & D KNEE WITH POLY EXCHANGE Right 04/07/2023   Procedure: IRRIGATION AND DEBRIDEMENT KNEE WITH POLY EXCHANGE;  Surgeon: Ernie Cough, MD;  Location: [facility];  Service: Orthopedics;  Laterality: Right;   TOTAL KNEE ARTHROPLASTY Left 08/26/2020   Procedure: LEFT TOTAL KNEE ARTHROPLASTY;  Surgeon: Liam Lerner, MD;  Location: [facility];  Service: Orthopedics;  Laterality: Left;   TOTAL KNEE ARTHROPLASTY Right 12/15/2022   Procedure: TOTAL KNEE ARTHROPLASTY;  Surgeon: Ernie Cough, MD;  Location: [facility];  Service: Orthopedics;  Laterality: Right;   TRANSESOPHAGEAL ECHOCARDIOGRAM (CATH LAB) N/A 04/12/2023   Procedure: TRANSESOPHAGEAL ECHOCARDIOGRAM;  Surgeon: Mona Vinie BROCKS, MD;  Location: [facility];   Service: Cardiovascular;  Laterality: N/A;   VIDEO BRONCHOSCOPY Bilateral 04/29/2023   Procedure: VIDEO BRONCHOSCOPY WITHOUT FLUORO;  Surgeon: Neda Jennet LABOR, MD;  Location: [facility];  Service: Pulmonary;  Laterality: Bilateral;  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

## 2024-05-01 NOTE — Result Encounter Note (Signed)
 Please let patient know, mild AS, no intervention at this time. Monitor echo q25yrs. mild MR, trace TR, remainder of echo WNL, LVEF 55-60%, no LVH. Continue current treatments.

## 2024-05-02 ENCOUNTER — Encounter (INDEPENDENT_AMBULATORY_CARE_PROVIDER_SITE_OTHER): Payer: Self-pay | Admitting: PHYSICIAN ASSISTANT

## 2024-05-09 ENCOUNTER — Other Ambulatory Visit (INDEPENDENT_AMBULATORY_CARE_PROVIDER_SITE_OTHER): Payer: Self-pay | Admitting: Family Medicine

## 2024-05-09 ENCOUNTER — Other Ambulatory Visit (INDEPENDENT_AMBULATORY_CARE_PROVIDER_SITE_OTHER): Payer: Self-pay

## 2024-05-09 DIAGNOSIS — Z7189 Other specified counseling: Secondary | ICD-10-CM

## 2024-05-09 NOTE — Nursing Note (Signed)
 POPULATION HEALTH    DISEASE MANAGEMENT COORDINATOR  [facility] called patient. No answer. [facility] left voicemail with contact information.     Ryan Adler, RN

## 2024-05-10 ENCOUNTER — Ambulatory Visit (INDEPENDENT_AMBULATORY_CARE_PROVIDER_SITE_OTHER): Payer: Self-pay | Admitting: Family Medicine

## 2024-05-10 ENCOUNTER — Other Ambulatory Visit (INDEPENDENT_AMBULATORY_CARE_PROVIDER_SITE_OTHER): Payer: Self-pay

## 2024-05-10 DIAGNOSIS — Z7189 Other specified counseling: Secondary | ICD-10-CM

## 2024-05-10 DIAGNOSIS — I4892 Unspecified atrial flutter: Secondary | ICD-10-CM

## 2024-05-10 DIAGNOSIS — I10 Essential (primary) hypertension: Secondary | ICD-10-CM

## 2024-05-10 NOTE — Progress Notes (Signed)
 Dr. Lauraine Plants, DO  ,    This patient has met the requirements to bill for Chronic Care Management services this month. Please sign this encounter as you typically would any other encounter. There is NO SPECIAL ATTESTATION needed . COSIGN ONLY. Please let us  know if you have any questions at all.    Chronic Care Management Time Documentation on 05/10/2024 16:21.   Time spent during current encounter is 1 minutes.   Cumulative time during current month's episode (month-to-date) is 50 minutes.          ICD-10-CM    1. Encounter for counseling for care management of patient with chronic conditions and complex health needs using nurse-based model  Z71.89       2. Atrial flutter, unspecified type (CMS HCC)  I48.92       3. Essential hypertension  I10             acetaminophen (TYLENOL) 500 mg Oral Tablet, Take 1 Tablet (500 mg total) by mouth Every 4 hours as needed for Pain (pt takes tylenol pm)  alfuzosin  (UROXATRAL ) 10 mg Oral Tablet Sustained Release 24 hr, Take 1 Tablet (10 mg total) by mouth Once a day  ELIQUIS  5 mg Oral Tablet, TAKE 1 TABLET TWICE DAILY  finasteride  (PROSCAR ) 5 mg Oral Tablet, Take 1 Tablet (5 mg total) by mouth Once a day  flecainide  (TAMBOCOR ) 100 mg Oral Tablet, Take 1 Tablet (100 mg total) by mouth Twice daily  furosemide  (LASIX ) 40 mg Oral Tablet, TAKE 1 TABLET EVERY DAY  losartan  (COZAAR ) 50 mg Oral Tablet, Take 2 tablets (100 mg) in am and 1 tablet (50 mg) in pm  Methylprednisolone  (MEDROL  DOSEPACK) 4 mg Oral Tablets, Dose Pack, Take as instructed.  nitroGLYCERIN  (NITROSTAT ) 0.4 mg Sublingual Tablet, Sublingual, Place 1 Tablet (0.4 mg total) under the tongue Every 5 minutes as needed for Chest pain  nystatin  (MYCOSTATIN ) 100,000 unit/mL Oral Suspension, TAKE 5 ML BY MOUTH FOUR TIMES A DAY AS NEEDED FOR THRUSH  potassium chloride  (K-DUR) 10 mEq Oral Tab Sust.Rel. Particle/Crystal, TAKE 1 TABLET ONE TIME DAILY  rosuvastatin  (CRESTOR ) 10 mg Oral Tablet, TAKE 1 TABLET EVERY EVENING  SPIRIVA   WITH HANDIHALER 18 mcg Inhalation Capsule, w/Inhalation Device, INHALE THE CONTENTS OF 1 CAPSULE EVERY DAY    No facility-administered medications prior to visit.        Thanks,  Your Chronic Care Management Team

## 2024-05-10 NOTE — Addendum Note (Signed)
 Addended by: Aime Carreras on: 05/10/2024 04:18 PM     Modules accepted: Orders

## 2024-05-10 NOTE — Telephone Encounter (Signed)
 Per pt no vm left, i do not see anything, did someone call him?

## 2024-05-10 NOTE — Telephone Encounter (Signed)
I did not call this pt.

## 2024-05-10 NOTE — Telephone Encounter (Signed)
 Last Visit:02/21/2024     Upcoming appointments: 05/10/2024           Waddell Pam, MA  05/10/2024, 11:00

## 2024-05-10 NOTE — Telephone Encounter (Signed)
 Regarding: Returning Call  ----- Message from [place] sent at 05/09/2024  4:20 PM EST -----  Copied From CRM #[id number].[name], Thomas E (Self) is returning a call they received from the staff they state was from family medicine.  Please advise.

## 2024-05-10 NOTE — Nursing Note (Signed)
 POPULATION HEALTH    COMPLEX CARE MANAGEMENT    Chronic Care Management - CCM  Status: Enrolled  Effective Dates: 01/10/2024 - present  Responsible Staff: Jasmain Ahlberg, RN          Patient reports to be doing well. His BP is stable  122/82.Patient denies CP and sob. Patient has pulmonary appointment on 12/3. Patient is in need of new BP cuff. [facility] will place consult to PN for assistance. DMC encouraged a healthy diet and activity. Patient is going to daughters for thanksgiving. Patient reports no more dizziness. Patient is not in need of any medications. DMC will continue to follow patient .    Care Management Tasks Completed:  Chart review completed.  Social determinants of health reviewed and updated as needed.  The following Care plan on HTN, Diet, activity were reviewed/discussed/updated.  Interventions ongoing and updated as needed.  Goals addressed and updated.  Interventions ongoing and updated as needed.  The following Education on diet, activity, HTN were  completed.  Self management plan reviewed and updated.  See patient care coordination note.  Barriers to health, care plan, and goals reviewed.  Interventions ongoing and updated as needed.  The following Health maintenance/care gaps on vaccinations and medicare wellness were  reviewed and discussed with patient.  Health maintenance/care gaps updated as applicable.  Discussed upcoming appointment dates and times.  Reinforced importance of keeping all provider appointments.  Reinforced the importance of taking all medications as prescribed.  Medical history, surgical history, hospitalizations, and medications reviewed and updated as applicable.    Ongoing and Updated Assessment Interventions:  Discussed therapy and progress  Encouraged BP Checks   Encouraged Activity  Encouraged healthy diet   Confirmed upcoming appointments   Discussed need for new BP cuff   Referral sent to PN for assistance in providing cuff      Case Manager To Do List for the Next  Interaction:  Monthly follow up  F/U BP Cuff  F/U BP  F/U Pulmonary  F/U therapy     Plan to call patient in ~ one month to reassess and update plan of care.  Instructed patient to call with change in symptoms or as needed prior to next follow up.      Ryan Adler, RN

## 2024-05-15 ENCOUNTER — Other Ambulatory Visit (INDEPENDENT_AMBULATORY_CARE_PROVIDER_SITE_OTHER): Payer: Self-pay

## 2024-05-15 ENCOUNTER — Other Ambulatory Visit (INDEPENDENT_AMBULATORY_CARE_PROVIDER_SITE_OTHER): Payer: Self-pay | Admitting: Urology

## 2024-05-15 DIAGNOSIS — Z7189 Other specified counseling: Secondary | ICD-10-CM

## 2024-05-15 NOTE — Nursing Note (Signed)
 Population Health - Patient Outreach    Patient Navigator mailed XL BP Cuff and Monitor and pulse ox to the address listed in patient's chart.     Harvy PARAS Cupp-Settimio, RN  05/15/2024, 13:23

## 2024-05-16 ENCOUNTER — Telehealth (INDEPENDENT_AMBULATORY_CARE_PROVIDER_SITE_OTHER): Payer: Self-pay | Admitting: PHYSICIAN ASSISTANT

## 2024-05-16 NOTE — Nursing Note (Signed)
 The insurance is requesting a prior auth for losartan  100 mg in am and 50 mg in pm. I was ordered this was but her office note from 03/31/24 you wrote Losartan  50 mg BID . Can you clarify which dose he should be taking? Thank you

## 2024-05-17 ENCOUNTER — Ambulatory Visit: Payer: Self-pay

## 2024-05-17 ENCOUNTER — Other Ambulatory Visit: Payer: Self-pay

## 2024-05-17 ENCOUNTER — Encounter (HOSPITAL_COMMUNITY): Payer: Self-pay

## 2024-05-17 VITALS — BP 141/84 | HR 55 | Temp 97.8°F | Ht 70.0 in | Wt 251.0 lb

## 2024-05-17 DIAGNOSIS — J449 Chronic obstructive pulmonary disease, unspecified: Secondary | ICD-10-CM

## 2024-05-17 NOTE — Progress Notes (Signed)
 PULMONARY, [facility]  Operated by [facility]  [address] [place] [address]  Dept: [phone]  Dept Fax: [phone]   PULMONARY OFFICE VISIT  Thomas, [name], 78 y.o. male  Date of Birth:  1945/06/26  Date of service: 05/17/2024  ASSESSMENT AND PLAN    ASSESSMENT:  COPD - mild  Thrush ?    RECOMMENDATIONS:  Continue Spiriva   Would diagnose throat infection definitively  May need to see ID  Took picture on patients phone of lesion      Return to clinic in: 6 months    Treavor Blomquist, MD    SUBJECTIVE     Thomas [name] is a 78 y.o. male who presents to the pulmonary office as a follow up.       History of: COPD    On Spiriva   No recent steroids  Gets thrush per ENT and PCP  Treated with nystatin       If Smoked:  Age started Smoking: 21  Age quit smoking 39        Occupation:  engineer, water  Exposures/Inhalation hx: (asbestos, coal, silica, mold): None      PULMONARY FUNCTION TESTS:  [facility]  Operated by [facility]  [address]  [place] [address]  Dept: [phone]  Dept Fax: [phone]                  PULMONARY FUNCTION TESTING     RESULTS:  FEV1/FVC ratio is normal at 71%.  FEV1 is normal at 84% of predicted.  FVC is normal at 88% predicted.  Following albuterol , there is no significant improvement in spirometry.       Total lung capacity is normal at 116% of predicted.  Residual volume is increased at 155% of predicted.  Slow vital capacity is normal at 94% of predicted.  Diffusion capacity is reduced at 67% of predicted.  Flow volume loop is obstructed in appearance.     IMPRESSION:  Normal spirometry.    No significant improvement in spirometry following albuterol    Air trapping as indicated by increased residual volume 155% predicted.  Decreased diffusion capacity at 67% predicted, with no significant correction when considering alveolar volume      Though spirometry component is normal, the patient's lung volumes indicate air  trapping, diffusion capacity is decreased and flow volume lopo is obstructed appearance.  This pattern suggests an underlying obstructive pulmonary limitation.     Further clinical and radiographic correlation is advise       CHEST IMAGING:  INDICATION:  Chronic cough.     TECHNIQUE:  Frontal and lateral chest.      COMPARISON:  02/05/2021 XR Chest.     FINDINGS:    The cardiomediastinal silhouette is normal in size.  There is no consolidation or atelectasis in either lung.  There are no pleural effusions.  There is no pneumothorax.  No acute osseous process.       IMPRESSION:  No acute pulmonary pathology.    ROS:   Other than ROS in the HPI, all other systems were negative.    EXAM:  Temperature: 36.6 C (97.8 F)  Heart Rate: 55  BP (Non-Invasive): (!) 141/84  SpO2: 94 %  General: does not appear in acute distress  HEENT: Atraumatic normocephalic.   Mouth: R sided lesion buccal mucosa, erythematous with central vesicle  Neck: Trachea in the midline. Supple.   Cardiovascular: S1-S2 heard  Lungs: Bilateral air entry  noted.  Abdomen: Soft, nontender.   Musculoskeletal: No joint swelling or erythema or tenderness  Extremities: No edema clubbing or cyanosis.  Skin: Dry, warm to touch. Normal turgor.   Neurological: Awake, alert , oriented. Nonfocal.  Psychiatric: Normal mood and affect.      Total time spent on encounter was 20 minutes. In addition to face to face time with the patient performing history and exam, this includes time spent before and after the visit reviewing records, personally reviewing prior imaging, reviewing prior cardiac studies as available, reviewing prior outpatient notes as available, reviewing the patient's medication record, coordinating with office staff and time spent performing final documentation in the medical record.    Thomas Code, MD

## 2024-05-17 NOTE — Nursing Note (Signed)
 05/17/24 1500   CAT Assessment   Cough 3   Phlegm 0 (no phlegm at all)   Chest Tightness 0 (not tight at all)   Breathlessness 3   Limited activities at home 1   Confidence leaving home 1   Sleep 1   Energy 2   Total CAT Score 11

## 2024-05-17 NOTE — Nursing Note (Signed)
 05/17/24 1500   Situation   Sitting and Reading 1   Watching TV 1   Sitting inactive in a public place. 0   As a passenger in a car for an hour without a break. 0   Lying down to rest in the afternoon when circumstances permit. 0   Sitting and Talking to someone 0   Sitting quietly after a lunch without alcohol 1   In a car, while stopped for a few minutes in traffic 0   Epworth Sleepiness Scale Score   Score total 3     Neck Cir- 18 inches.

## 2024-05-18 ENCOUNTER — Telehealth: Payer: Self-pay

## 2024-05-18 DIAGNOSIS — F4321 Adjustment disorder with depressed mood: Secondary | ICD-10-CM

## 2024-05-18 DIAGNOSIS — F3341 Major depressive disorder, recurrent, in partial remission: Secondary | ICD-10-CM

## 2024-05-18 DIAGNOSIS — R4589 Other symptoms and signs involving emotional state: Secondary | ICD-10-CM

## 2024-05-18 NOTE — Progress Notes (Signed)
 Thomas [name]  [facility]  Outpatient Progress Note    TELEMEDICINE DOCUMENTATION:    Patient Location:  MyChart video visit from home address: [address]  [place] [address]    Patient/family aware of provider location:  yes  Patient/family consent for telemedicine:  yes  Examination observed and performed by:  Rollo Relic, LICSW       Client: Thomas [name]  Medical Record Number: [id number]  Payor Source: Payor: [employer] MEDICARE / Plan: [facility] CHOICE PPO / Product Type: PPO /   Date: 05/18/2024   CPT Code: 09165  SESSION Information      Start Time: 9:15AM  End Time: 10:00AM    Subjective:   Thomas [name] presents today for an individual therapy session to continue addressing ongoing struggles related to loneliness due multiple losses. He presents 15 minutes late stating that he feels overwhelmed. He denies any current symptoms as he states he has been feeling better. Current stressors he is experiencing include: ongoing loneliness. Thomas [name] presents stating that he is feeling overwhelmed as he had been informed recently by [employer] that he was not in their system.  He has since gotten this resolved.  He is also scheduled to have a new furnace installed this morning.  He is also concerned as he has been struggling with grief.     Objective:   Appearance: dressed appropriately   Mood: Dysphoric  Affect: congruent to mood   Behavior: Good eye contact and Good participation   Suicidal Ideation/Homicidal Ideation: Denies    Assessment:     ICD-10-CM    1. Recurrent major depressive disorder, in partial remission (CMS HCC)  F33.41       2. Grief  F43.21       3. Loneliness  R45.89           Procedures:    Client attended an individual therapy session. Developing and using coping skills and developing and using relaxation techniques.  Utilized the [employer] of Cognitive Behavioral Therapy to instruct client on the relationship of events, thoughts, and emotions and work toward  disputing distressing thoughts and development of more effective beliefs.  Discussed coping strategies for his grief pointing out to look for the message in his emotions and giving self compassion.  Discussed different activities to engage in that uses his strengths. He states liking the idea of looking into bus trips.  He admits needing to get past doing this alone, but expressed intent to move forward.      Goals:   To learn healthy coping skills to effectively manage loneliness, grief and depression, developing and using coping skills, developing and using relaxation techniques, and developing and using containment strategies.       PLAN:    Review coping mechanisms used next session.  Return to clinic in 2 weeks or sooner if needed.     Rollo Relic, MSW, LICSW  Clinical Therapist  [place] [facility]  05/18/2024, 09:18  Late Entry for: 05/18/2024

## 2024-05-19 ENCOUNTER — Telehealth: Payer: Self-pay

## 2024-05-19 NOTE — Telephone Encounter (Signed)
 Copied from CRM [id number]. Topic: General - Other >> May 19, 2024 11:29 AM Revonda D wrote: Reason for CRM: Zack with [facility] [employer] is calling to verify if the office received the forms he faxed on 12/2 and would like to have them signed and faxed back. Zack would like a callback with an update [phone].

## 2024-05-22 NOTE — Telephone Encounter (Signed)
 Pt is unable to transfer care due to the provider being too far out. Pt would like to stay with Dr. Rollene at this time. Advised if pt needs assistance, pt to contact office.

## 2024-05-23 ENCOUNTER — Telehealth: Payer: Self-pay

## 2024-05-23 NOTE — Telephone Encounter (Signed)
 Copied from CRM #[id number]. Topic: General - Call Back - No Documentation >> May 23, 2024 10:05 AM Alfonso ORN wrote: Reason for CRM: [facility] called to f/u on fax 12/3. Called clinic who advised to send f/u message for someone to contact him back with update.

## 2024-05-24 ENCOUNTER — Encounter: Payer: Self-pay | Admitting: Physician Assistant

## 2024-05-24 ENCOUNTER — Other Ambulatory Visit (INDEPENDENT_AMBULATORY_CARE_PROVIDER_SITE_OTHER): Payer: Self-pay | Admitting: PHYSICIAN ASSISTANT

## 2024-05-24 ENCOUNTER — Encounter (INDEPENDENT_AMBULATORY_CARE_PROVIDER_SITE_OTHER): Payer: Self-pay | Admitting: Family Medicine

## 2024-05-24 MED ORDER — LOSARTAN 100 MG TABLET: ORAL_TABLET | ORAL | 3 refills | Status: DC

## 2024-05-24 NOTE — Telephone Encounter (Signed)
 Spoke with [name] and notified that request was denied by provider. Form initially faxed on 12/9 with this information. Requested that form be faxed again stating that the request was denied.   Refaxed on 12/10.

## 2024-05-24 NOTE — Telephone Encounter (Signed)
 I sent a script for losartan  100 mg tabs take 1 in am daily and 1/2 tablet in pm daily. I spoke with [facility] and they were able to run the medication through the insurance. She states he should receive the prescription in 3-5 days. I called and advised patient

## 2024-05-24 NOTE — Nursing Note (Signed)
 REVIEWED ACTIVE MEDICATIONS. PARAMETERS FOR REFILL MET PER DEPARTMENTAL REFILL PROTOCOL. REFILL SENT ELECTRONICALLY TO PHARMACY. ORDER FORWARDED TO PROVIDER TO CO-SIGN.  Pleasant Fantasia, RN

## 2024-05-27 DIAGNOSIS — J9601 Acute respiratory failure with hypoxia: Secondary | ICD-10-CM | POA: Diagnosis not present

## 2024-06-05 ENCOUNTER — Other Ambulatory Visit (INDEPENDENT_AMBULATORY_CARE_PROVIDER_SITE_OTHER): Payer: Self-pay

## 2024-06-05 DIAGNOSIS — Z7189 Other specified counseling: Secondary | ICD-10-CM

## 2024-06-05 NOTE — Progress Notes (Signed)
 Opened in error

## 2024-06-05 NOTE — Nursing Note (Signed)
 POPULATION HEALTH    COMPLEX CARE MANAGEMENT    Chronic Care Management - CCM  Status: Enrolled  Effective Dates: 01/10/2024 - present  Responsible Staff: Dream Nodal, RN          Patient reports to be doing well. Patient followed with Pulmonary. He is on Spiriva  and he noted no change in cough, however today he is having increased coughing. [facility] messaged Pulmonary. [facility] will share guidance. Patient also had a lesion on right cheek it seems to be better. Pulmonary wanted PCP to assess the need of ENT or ID at next appointment. [facility] messaged PCP. Patient Has been checking BP no significant changes 140's / 80's. Patient was placed on 100 mg in am and 50 mg in afternoon. [facility] will note to Cardio . Patient is switching to [facility] in January. He wants to have a mail order pharmacy from [place] . DMC will place referral to pharmacy to assist patient in the new year. Patient confirmed upcoming appointments. Patient also will see Tobie and is to have a uro procedure. [facility]MC will send some information to patient to review and encouraged to ask questions for procedure. Patient also still seeing therapist  and feels good. Follow up in two weeks.     Care Management Tasks Completed:  Chart review completed.  Social determinants of health reviewed and updated as needed.  The following Care plan on HTN, Pulmonary, General Wellness were reviewed/discussed/updated.  Interventions ongoing and updated as needed.  Goals addressed and updated.  Interventions ongoing and updated as needed.  The following Education on BP, Breathing, Diet, Cough, were  completed.  Self management plan reviewed and updated.  See patient care coordination note.  Barriers to health, care plan, and goals reviewed.  Interventions ongoing and updated as needed.  The following Health maintenance/care gaps Vaccinations were  reviewed and discussed with patient.  Health maintenance/care gaps updated as applicable.  Discussed upcoming appointment dates and times.   Reinforced importance of keeping all provider appointments.  Reinforced the importance of taking all medications as prescribed.  Medical history, surgical history, hospitalizations, and medications reviewed and updated as applicable.    Ongoing and Updated Assessment Interventions:  Encouraged BP Checks   Discussed Pulmonary appointment   Reviewed increase cough   Message Pulmonary to notify   Message PCP  to make aware of Lesion in mouth   Referral to Pharmacy  for [facility] mail order delivery   Discussed therapy sessions     Case Manager To Do List for the Next Interaction:  Monthly follow up  F/U BP   F/U Lesion in mouth   F/U PCP   F/U Coughing Spiriva   F/U consult Pharmacy     Plan to call patient in ~ one month to reassess and update plan of care.  Instructed patient to call with change in symptoms or as needed prior to next follow up.      Ryan Adler, RN

## 2024-06-05 NOTE — Progress Notes (Signed)
 Population Health    Reason for Encounter: Medication Reconciliation    I was consulted by Ryan Adler to help assist Bill with transfer his medications to a new pharmacy. I tried to contact Thomas [name] today to discuss his medications and help him get the medications transferred, but was unable to reach him at this time. I left a message and instructed him to call me back at his earliest convenience. I will continue to follow the profile until I have the opportunity to speak with the patient to discuss his medications and address any questions or concerns that he may have.       Quality measures: Patient is not due for any quality measures at this time        Lab Results   Component Value Date    CHOLESTEROL 135 02/16/2024    HDLCHOL 62 02/16/2024    LDLCHOL 61 02/16/2024    TRIG 58 02/16/2024        Lab Results   Component Value Date    TSH 1.665 05/01/2022         Current Outpatient Medications   Medication Sig    acetaminophen (TYLENOL) 500 mg Oral Tablet Take 1 Tablet (500 mg total) by mouth Every 4 hours as needed for Pain (pt takes tylenol pm)    alfuzosin  (UROXATRAL ) 10 mg Oral Tablet Sustained Release 24 hr Take 1 Tablet (10 mg total) by mouth Once a day    ELIQUIS  5 mg Oral Tablet TAKE 1 TABLET TWICE DAILY    finasteride  (PROSCAR ) 5 mg Oral Tablet TAKE 1 TABLET ONE TIME DAILY    flecainide  (TAMBOCOR ) 100 mg Oral Tablet Take 1 Tablet (100 mg total) by mouth Twice daily    furosemide  (LASIX ) 40 mg Oral Tablet TAKE 1 TABLET EVERY DAY    losartan  (COZAAR ) 100 mg Oral Tablet Take 1 Tablet (100 mg total) by mouth Every morning AND 0.5 Tablets (50 mg total) Every night.    Methylprednisolone  (MEDROL  DOSEPACK) 4 mg Oral Tablets, Dose Pack Take as instructed. (Patient not taking: Reported on 05/17/2024)    nitroGLYCERIN  (NITROSTAT ) 0.4 mg Sublingual Tablet, Sublingual Place 1 Tablet (0.4 mg total) under the tongue Every 5 minutes as needed for Chest pain    nystatin  (MYCOSTATIN ) 100,000 unit/mL Oral Suspension TAKE 5 ML  BY MOUTH FOUR TIMES A DAY AS NEEDED FOR THRUSH    potassium chloride  (K-DUR) 10 mEq Oral Tab Sust.Rel. Particle/Crystal TAKE 1 TABLET ONE TIME DAILY    rosuvastatin  (CRESTOR ) 10 mg Oral Tablet TAKE 1 TABLET EVERY EVENING    SPIRIVA  WITH HANDIHALER 18 mcg Inhalation Capsule, w/Inhalation Device INHALE THE CONTENTS OF 1 CAPSULE EVERY DAY     Thank you for allowing me to care for this patient.  Please feel free to contact me with any questions or concerns.      Thanks!  Cesar Nose, [place]  [phone] ext [phone]

## 2024-06-07 ENCOUNTER — Other Ambulatory Visit (INDEPENDENT_AMBULATORY_CARE_PROVIDER_SITE_OTHER): Payer: Self-pay

## 2024-06-07 DIAGNOSIS — Z7189 Other specified counseling: Secondary | ICD-10-CM

## 2024-06-07 NOTE — Progress Notes (Signed)
 Population Health    Reason for Encounter: Medication Reconciliation     I was consulted by Ryan Adler to help assist Thomas [name] with transferring his medications to a new pharmacy. I have tried several attempts to contact Thomas [name] to discuss his medications and help him get the medications transferred, but have been unable to reach him at this time. I will continue to follow the profile until I have the opportunity to speak with the patient to discuss his medications and address any questions or concerns that he may have.         Quality measures: Patient is not due for any quality measures at this time      Problem list:    Lab Results   Component Value Date    CHOLESTEROL 135 02/16/2024    HDLCHOL 62 02/16/2024    LDLCHOL 61 02/16/2024    TRIG 58 02/16/2024        Lab Results   Component Value Date    TSH 1.665 05/01/2022         Current Outpatient Medications   Medication Sig    acetaminophen (TYLENOL) 500 mg Oral Tablet Take 1 Tablet (500 mg total) by mouth Every 4 hours as needed for Pain (pt takes tylenol pm)    alfuzosin  (UROXATRAL ) 10 mg Oral Tablet Sustained Release 24 hr Take 1 Tablet (10 mg total) by mouth Once a day    ELIQUIS  5 mg Oral Tablet TAKE 1 TABLET TWICE DAILY    finasteride  (PROSCAR ) 5 mg Oral Tablet TAKE 1 TABLET ONE TIME DAILY    flecainide  (TAMBOCOR ) 100 mg Oral Tablet Take 1 Tablet (100 mg total) by mouth Twice daily    furosemide  (LASIX ) 40 mg Oral Tablet TAKE 1 TABLET EVERY DAY    losartan  (COZAAR ) 100 mg Oral Tablet Take 1 Tablet (100 mg total) by mouth Every morning AND 0.5 Tablets (50 mg total) Every night.    Methylprednisolone  (MEDROL  DOSEPACK) 4 mg Oral Tablets, Dose Pack Take as instructed. (Patient not taking: Reported on 05/17/2024)    nitroGLYCERIN  (NITROSTAT ) 0.4 mg Sublingual Tablet, Sublingual Place 1 Tablet (0.4 mg total) under the tongue Every 5 minutes as needed for Chest pain    nystatin  (MYCOSTATIN ) 100,000 unit/mL Oral Suspension TAKE 5 ML BY MOUTH FOUR TIMES A DAY AS NEEDED  FOR THRUSH    potassium chloride  (K-DUR) 10 mEq Oral Tab Sust.Rel. Particle/Crystal TAKE 1 TABLET ONE TIME DAILY    rosuvastatin  (CRESTOR ) 10 mg Oral Tablet TAKE 1 TABLET EVERY EVENING    SPIRIVA  WITH HANDIHALER 18 mcg Inhalation Capsule, w/Inhalation Device INHALE THE CONTENTS OF 1 CAPSULE EVERY DAY     Thank you for allowing me to care for this patient.  Please feel free to contact me with any questions or concerns.      Thanks!  Thomas [name], [place]  [phone] ext [phone]

## 2024-06-11 ENCOUNTER — Other Ambulatory Visit: Payer: Self-pay

## 2024-06-12 ENCOUNTER — Other Ambulatory Visit: Payer: Self-pay

## 2024-06-12 ENCOUNTER — Other Ambulatory Visit (INDEPENDENT_AMBULATORY_CARE_PROVIDER_SITE_OTHER): Payer: Self-pay

## 2024-06-12 DIAGNOSIS — Z7189 Other specified counseling: Secondary | ICD-10-CM

## 2024-06-12 MED ORDER — FUROSEMIDE 40 MG TABLET
40.0000 mg | ORAL_TABLET | Freq: Every day | ORAL | 3 refills | Status: AC
  Filled 2024-06-12 – 2024-06-15 (×2): qty 90, 90d supply, fill #0

## 2024-06-12 MED ORDER — FINASTERIDE 5 MG TABLET
5.0000 mg | ORAL_TABLET | Freq: Every day | ORAL | 0 refills | Status: AC
  Filled 2024-06-12 – 2024-06-15 (×2): qty 90, 90d supply, fill #0

## 2024-06-12 MED ORDER — ALFUZOSIN ER 10 MG TABLET,EXTENDED RELEASE 24 HR
10.0000 mg | ORAL_TABLET | Freq: Every day | ORAL | 0 refills | Status: AC
  Filled 2024-06-12 – 2024-06-15 (×2): qty 90, 90d supply, fill #0

## 2024-06-12 MED ORDER — POTASSIUM CHLORIDE ER 10 MEQ TABLET,EXTENDED RELEASE(PART/CRYST)
10.0000 meq | ORAL_TABLET | Freq: Every day | ORAL | 3 refills | Status: DC
  Filled 2024-06-12 – 2024-06-16 (×3): qty 90, 90d supply, fill #0

## 2024-06-12 MED ORDER — APIXABAN 5 MG TABLET
5.0000 mg | ORAL_TABLET | Freq: Two times a day (BID) | ORAL | 3 refills | Status: AC
  Filled 2024-06-12 – 2024-06-15 (×2): qty 180, 90d supply, fill #0

## 2024-06-12 MED ORDER — LOSARTAN 100 MG TABLET
ORAL_TABLET | ORAL | 3 refills | Status: AC
  Filled 2024-06-12 – 2024-06-15 (×2): qty 135, 90d supply, fill #0

## 2024-06-12 MED ORDER — NITROGLYCERIN 0.4 MG SUBLINGUAL TABLET
0.4000 mg | SUBLINGUAL_TABLET | SUBLINGUAL | 1 refills | Status: AC | PRN
  Filled 2024-06-12 – 2024-06-15 (×2): qty 25, 8d supply, fill #0

## 2024-06-12 NOTE — Progress Notes (Signed)
 Population Health    Reason for Encounter: Medication Reconciliation     I was consulted by Ryan Adler to help assist Thomas [name] with transferring his medications to a new pharmacy. I called today and was able to speak with Thomas [name] to discuss all of his medications. Patient reports that he was interested in transferring to [facility] because he is switching his insurance plan to [facility]. I thoroughly explained the transfer process to the patient and told him that once his profile is set up and completed at [facility] pharmacy, one of the pharmacists there would then call [facility] to get all of his active medications transferred. Patient also needs to have a card on file at [facility] pharmacy for them to charge for any copays as the patient does want to utilize their delivery service. Patient reports that he fully understood the process and would call [facility] this week to give them that information. I also made sure that the patient had my correct contact information and told him to call me if he had any issues with the new pharmacy. Patient was agreeable and thanked us  for our service.         Quality measures: Patient is not due for any quality measures at this time      Problem list:    Lab Results   Component Value Date    CHOLESTEROL 135 02/16/2024    HDLCHOL 62 02/16/2024    LDLCHOL 61 02/16/2024    TRIG 58 02/16/2024        Lab Results   Component Value Date    TSH 1.665 05/01/2022         Current Outpatient Medications   Medication Sig    acetaminophen (TYLENOL) 500 mg Oral Tablet Take 1 Tablet (500 mg total) by mouth Every 4 hours as needed for Pain (pt takes tylenol pm)    alfuzosin  (UROXATRAL ) 10 mg Oral Tablet Sustained Release 24 hr Take 1 Tablet (10 mg total) by mouth Once a day    ELIQUIS  5 mg Oral Tablet TAKE 1 TABLET TWICE DAILY    finasteride  (PROSCAR ) 5 mg Oral Tablet TAKE 1 TABLET ONE TIME DAILY    flecainide  (TAMBOCOR ) 100 mg Oral Tablet Take 1 Tablet (100 mg  total) by mouth Twice daily    furosemide  (LASIX ) 40 mg Oral Tablet TAKE 1 TABLET EVERY DAY    losartan  (COZAAR ) 100 mg Oral Tablet Take 1 Tablet (100 mg total) by mouth Every morning AND 0.5 Tablets (50 mg total) Every night.    Methylprednisolone  (MEDROL  DOSEPACK) 4 mg Oral Tablets, Dose Pack Take as instructed. (Patient not taking: Reported on 05/17/2024)    nitroGLYCERIN  (NITROSTAT ) 0.4 mg Sublingual Tablet, Sublingual Place 1 Tablet (0.4 mg total) under the tongue Every 5 minutes as needed for Chest pain    nystatin  (MYCOSTATIN ) 100,000 unit/mL Oral Suspension TAKE 5 ML BY MOUTH FOUR TIMES A DAY AS NEEDED FOR THRUSH    potassium chloride  (K-DUR) 10 mEq Oral Tab Sust.Rel. Particle/Crystal TAKE 1 TABLET ONE TIME DAILY    rosuvastatin  (CRESTOR ) 10 mg Oral Tablet TAKE 1 TABLET EVERY EVENING    SPIRIVA  WITH HANDIHALER 18 mcg Inhalation Capsule, w/Inhalation Device INHALE THE CONTENTS OF 1 CAPSULE EVERY DAY     Thank you for allowing me to care for this patient.  Please feel free to contact me with any questions or concerns.      Thanks!  Cesar Nose, [place]  [phone] ext [phone]

## 2024-06-12 NOTE — Nursing Note (Signed)
 POPULATION HEALTH    DISEASE MANAGEMENT COORDINATOR    Patient  called [facility] to Notify he was working with Pharmacist Ty and that he needs new scripts sent to [facility], that his old mail order pharmacy normally takes a long time to send over per [facility] ([place]). [facility] will create a new encounter and will pend medications.   Ryan Adler, RN

## 2024-06-13 ENCOUNTER — Other Ambulatory Visit (INDEPENDENT_AMBULATORY_CARE_PROVIDER_SITE_OTHER): Payer: Self-pay

## 2024-06-13 ENCOUNTER — Other Ambulatory Visit: Payer: Self-pay

## 2024-06-13 DIAGNOSIS — I10 Essential (primary) hypertension: Secondary | ICD-10-CM

## 2024-06-13 DIAGNOSIS — J449 Chronic obstructive pulmonary disease, unspecified: Secondary | ICD-10-CM

## 2024-06-13 DIAGNOSIS — Z7189 Other specified counseling: Secondary | ICD-10-CM

## 2024-06-13 DIAGNOSIS — I4892 Unspecified atrial flutter: Secondary | ICD-10-CM

## 2024-06-13 MED ORDER — FLECAINIDE 100 MG TABLET
100.0000 mg | ORAL_TABLET | Freq: Two times a day (BID) | ORAL | 3 refills | Status: AC
  Filled 2024-06-13 – 2024-06-16 (×2): qty 180, 90d supply, fill #0

## 2024-06-13 NOTE — Progress Notes (Signed)
 Dr. Lauraine Plants, DO      This patient has met the requirements to bill for COMPLEX Chronic Care Management this month.       Complex CCM attestation    I supervised and collaborated with the nurse providing chronic care management services for Thomas [name] who met moderate-high complexity medical decision-making due to modifications of the care plan or treatment plan/medications during this calendar month.    Lauraine Plants, DO     Chronic Care Management Time Documentation on 06/13/2024 11:46.   Time spent during current encounter is 1 minutes.   Cumulative time during current month's episode (month-to-date) is 80 minutes.          ICD-10-CM    1. Encounter for counseling for care management of patient with chronic conditions and complex health needs using nurse-based model  Z71.89       2. Atrial flutter, unspecified type (CMS HCC)  I48.92       3. Essential hypertension  I10       4. Chronic obstructive pulmonary disease, unspecified COPD type (CMS HCC)  J44.9             acetaminophen (TYLENOL) 500 mg Oral Tablet, Take 1 Tablet (500 mg total) by mouth Every 4 hours as needed for Pain (pt takes tylenol pm)  alfuzosin  (UROXATRAL ) 10 mg Oral Tablet Sustained Release 24 hr, Take 1 Tablet (10 mg total) by mouth Daily  apixaban  (ELIQUIS ) 5 mg Oral Tablet, Take 1 Tablet (5 mg total) by mouth Twice daily  finasteride  (PROSCAR ) 5 mg Oral Tablet, Take 1 Tablet (5 mg total) by mouth Daily  flecainide  (TAMBOCOR ) 100 mg Oral Tablet, Take 1 Tablet (100 mg total) by mouth Twice daily  furosemide  (LASIX ) 40 mg Oral Tablet, Take 1 Tablet (40 mg total) by mouth Daily  losartan  (COZAAR ) 100 mg Oral Tablet, Take 1 Tablet (100 mg total) by mouth Every morning AND 1/2 Tablet (50 mg total) Every night.  Methylprednisolone  (MEDROL  DOSEPACK) 4 mg Oral Tablets, Dose Pack, Take as instructed. (Patient not taking: Reported on 05/17/2024)  nitroGLYCERIN  (NITROSTAT ) 0.4 mg Sublingual Tablet, Sublingual, Place 1 Tablet (0.4 mg total) under  the tongue Every 5 minutes as needed for Chest pain for 3 doses over 15 minutes  nystatin  (MYCOSTATIN ) 100,000 unit/mL Oral Suspension, TAKE 5 ML BY MOUTH FOUR TIMES A DAY AS NEEDED FOR THRUSH  potassium chloride  (K-DUR) 10 mEq Oral Tab Sust.Rel. Particle/Crystal, Take 1 Tablet (10 mEq total) by mouth Daily  rosuvastatin  (CRESTOR ) 10 mg Oral Tablet, TAKE 1 TABLET EVERY EVENING  SPIRIVA  WITH HANDIHALER 18 mcg Inhalation Capsule, w/Inhalation Device, INHALE THE CONTENTS OF 1 CAPSULE EVERY DAY    No facility-administered medications prior to visit.      Thanks,  Your Chronic Care Management Team

## 2024-06-15 ENCOUNTER — Other Ambulatory Visit: Payer: Self-pay

## 2024-06-16 ENCOUNTER — Other Ambulatory Visit: Payer: Self-pay

## 2024-06-19 ENCOUNTER — Other Ambulatory Visit: Payer: Self-pay

## 2024-06-19 MED ORDER — SPIRIVA WITH HANDIHALER 18 MCG AND INHALATION CAPSULES
18.0000 ug | ORAL_CAPSULE | Freq: Every day | RESPIRATORY_TRACT | 3 refills | Status: AC
  Filled 2024-06-19: qty 30, 30d supply, fill #0

## 2024-06-20 ENCOUNTER — Other Ambulatory Visit: Payer: Self-pay

## 2024-06-20 ENCOUNTER — Ambulatory Visit (INDEPENDENT_AMBULATORY_CARE_PROVIDER_SITE_OTHER): Payer: Self-pay | Admitting: Family Medicine

## 2024-06-20 ENCOUNTER — Ambulatory Visit: Attending: INTERNAL MEDICINE

## 2024-06-20 DIAGNOSIS — I10 Essential (primary) hypertension: Secondary | ICD-10-CM | POA: Insufficient documentation

## 2024-06-20 LAB — BASIC METABOLIC PANEL, FASTING
ANION GAP: 5 mmol/L (ref 4–13)
BUN/CREA RATIO: 21 (ref 6–22)
BUN: 19 mg/dL (ref 8–25)
CALCIUM: 9.3 mg/dL (ref 8.6–10.3)
CHLORIDE: 105 mmol/L (ref 96–111)
CO2 TOTAL: 34 mmol/L — ABNORMAL HIGH (ref 23–31)
CREATININE: 0.89 mg/dL (ref 0.75–1.35)
GLUCOSE: 89 mg/dL (ref 70–99)
POTASSIUM: 3.3 mmol/L — ABNORMAL LOW (ref 3.5–5.1)
SODIUM: 144 mmol/L (ref 136–145)
eGFRcr - MALE: 87 mL/min/1.73mˆ2 (ref >=60)

## 2024-06-20 LAB — CBC
HCT: 44.6 % (ref 38.9–52.0)
HGB: 15 g/dL (ref 13.4–17.5)
MCH: 31.6 pg (ref 26.0–32.0)
MCHC: 33.6 g/dL (ref 31.0–35.5)
MPV: 10.1 fL (ref 8.7–12.5)
PLATELETS: 151 x10ˆ3/uL (ref 150–400)
RBC: 4.75 x10ˆ6/uL (ref 4.50–6.10)
RDW-CV: 12.3 % (ref 11.5–15.5)

## 2024-06-20 NOTE — Result Encounter Note (Signed)
 I will discuss with patient at upcoming visit.

## 2024-06-21 ENCOUNTER — Encounter (INDEPENDENT_AMBULATORY_CARE_PROVIDER_SITE_OTHER): Payer: Self-pay | Admitting: Family Medicine

## 2024-06-21 ENCOUNTER — Other Ambulatory Visit: Payer: Self-pay

## 2024-06-21 ENCOUNTER — Ambulatory Visit (INDEPENDENT_AMBULATORY_CARE_PROVIDER_SITE_OTHER): Admitting: Family Medicine

## 2024-06-21 VITALS — BP 130/82 | HR 60 | Ht 70.0 in | Wt 255.2 lb

## 2024-06-21 DIAGNOSIS — J449 Chronic obstructive pulmonary disease, unspecified: Secondary | ICD-10-CM

## 2024-06-21 DIAGNOSIS — R001 Bradycardia, unspecified: Secondary | ICD-10-CM

## 2024-06-21 DIAGNOSIS — Z23 Encounter for immunization: Secondary | ICD-10-CM

## 2024-06-21 DIAGNOSIS — I10 Essential (primary) hypertension: Secondary | ICD-10-CM

## 2024-06-21 DIAGNOSIS — I4892 Unspecified atrial flutter: Secondary | ICD-10-CM

## 2024-06-21 DIAGNOSIS — Z0000 Encounter for general adult medical examination without abnormal findings: Secondary | ICD-10-CM

## 2024-06-21 DIAGNOSIS — F17220 Nicotine dependence, chewing tobacco, uncomplicated: Secondary | ICD-10-CM

## 2024-06-21 DIAGNOSIS — E782 Mixed hyperlipidemia: Secondary | ICD-10-CM

## 2024-06-21 MED ORDER — POTASSIUM CHLORIDE ER 10 MEQ TABLET,EXTENDED RELEASE(PART/CRYST): 10.0000 meq | ORAL_TABLET | Freq: Two times a day (BID) | ORAL | 3 refills | Status: AC

## 2024-06-21 MED ORDER — ROSUVASTATIN 10 MG TABLET
10.0000 mg | ORAL_TABLET | Freq: Every evening | ORAL | 3 refills | Status: AC
  Filled 2024-06-21: qty 90, 90d supply, fill #0

## 2024-06-21 NOTE — Nursing Note (Signed)
 06/21/24 1200   Advance Directives   Does patient have a living will or MPOA No   Advance directive information given to the patient today? Patient Declined   Activities of Daily Living   Do you need help with dressing, bathing, or walking? No   Do you need help with shopping, housekeeping, medications, or finances? No   Do you have rugs in hallways, broken steps, or poor lighting? No   Do you have grab bars in your bathroom, non-slip strips in your tub, and hand rails on your stairs? Yes  (handrails)   Cognitive Function Screen   What is you age? 1   What is the time to the nearest hour? 1   What is the year? 1   What is the name of this clinic? 1   Can the patient recognize two persons (the doctor, the nurse, home help, etc.)? 1   What is the date of your birth? (day and month sufficient)  1   In what year did World War II end? 1   Who is the current president of the [place] ? 1   Count from 20 down to 1? 1   What address did I give you earlier? 1   Total Score 10   Depression Screen   Little interest or pleasure in doing things. 1   Feeling down, depressed, or hopeless 1   PHQ 2 Total 2   Pain Score   Pain Score Zero   Substance Use Screening   In Past 12 MONTHS, how often have you used any tobacco product (for example, cigarettes, e-cigarettes, cigars, pipes, or smokeless tobacco)? Daily   In the PAST 3 MONTHS, did you smoke a cigarette containing tobacco or use any other nicotine delivery product (i.e., e-cigarette, vaping or chewing tobacco)? Yes   In the PAST 3 MONTHS, did you usually smoke more than 10 cigarettes, vape, use an e-cigarette or chew tobacco more than 10 times each day? No   In the PAST 3 MONTHS, did you usually smoke/use an e-cigarette, vape or chew tobacco within 30 minutes after waking? No   In the PAST 12 MONTHS, how often have you had 5 (men)/4 (women) or more drinks containing alcohol in one day? Never   In the PAST 12 months, how often have you used any prescription medications  just for the feeling, more than prescribed, or that were not prescribed for you? Prescriptions may include: opioids, benzodiazepines, medications for ADHD Never   In the PAST 12 MONTHS, how often have you used any drugs, including marijuana, cocaine or crack, heroin, methamphetamine, hallucinogens, ecstasy/MDMA? Never   Fall Risk Assessment   Do you feel unsteady when standing or walking? No   Do you worry about falling? No   Have you fallen in the past year? No   Urinary Incontinence Screen   Do you ever leak urine when you don't want to? YES

## 2024-06-21 NOTE — Nursing Note (Signed)
 Immunization administered       Name Date Dose VIS Date Route    Influenza Vaccine, 6 month-adult 06/21/2024 0.5 mL 07/16/2023 Intramuscular    Site: Left deltoid    Given By: Electa [place], MA    Manufacturer: [employer]    Lot: [id number]    Comment: TS MA    NDC: [id number]

## 2024-06-21 NOTE — Progress Notes (Signed)
 PRIMARY CARE, [facility]  [address]  [place] [address]    Medicare Annual Wellness Visit    Name: Thomas [name] MRN:  [id number]   Date: 06/21/2024 Age: 79 y.o.       SUBJECTIVE:   Thomas [name] is a 79 y.o. male for presenting for Medicare Wellness exam and .   I have reviewed and reconciled the medication list with the patient today.    I am dictating us  he has been independent and able to perform all of his own activities of daily living  No change in his family or personal medical history since last office visit  The only change in medications was that cardiology did stop his diltiazem  altogether.  He has been having bradycardic episodes and we continued to decrease the medication they stopped it altogether and increased his losartan  to 150 mg  He does feel that his blood pressure is better controlled at home now.  All those he states on asleep he felt a little bit better when his blood pressure was running a little higher.  He is no longer having bradycardia    Regarding hyperlipidemia: He continues on Crestor  10 mg and tolerates it well.  Lab Results   Component Value Date    CHOLESTEROL 135 02/16/2024    HDLCHOL 62 02/16/2024    LDLCHOL 61 02/16/2024    TRIG 58 02/16/2024        Regarding heart disease: He does have history of atrial flutter and follows with Cardiology routinely.  He is not on a rate-controlling medication right now as above but he does continue on Eliquis  as his anticoagulant.  He denies having any bleeding.    He does have history of Chronic Obstructive Pulmonary Disease and follows with pulmonology.  He continues on Spiriva .  He is not having any pulmonary symptoms at this time.    Most recent Cologuard 11/30/2023    Comprehensive Health Assessment:  Paper document COMPREHENSIVE HEALTH ASSESSMENT reviewed and scanned into medical record    I have reviewed and updated as appropriate the past medical, family and social history. 06/21/2024 as summarized  below:  Past Medical History:   Diagnosis Date    Arthritis     Atrial flutter     Cancer (CMS HCC)     Dysuria     Enteritis due to Rotavirus     Gout     Headache     Hemorrhoid     Hypercholesterolemia     Hypertension      Past Surgical History:   Procedure Laterality Date    Hx hemorrhoidectomy       Current Outpatient Medications   Medication Sig    acetaminophen (TYLENOL) 500 mg Oral Tablet Take 1 Tablet (500 mg total) by mouth Every 4 hours as needed for Pain (pt takes tylenol pm)    alfuzosin  (UROXATRAL ) 10 mg Oral Tablet Sustained Release 24 hr Take 1 Tablet (10 mg total) by mouth Daily    apixaban  (ELIQUIS ) 5 mg Oral Tablet Take 1 Tablet (5 mg total) by mouth Twice daily    finasteride  (PROSCAR ) 5 mg Oral Tablet Take 1 Tablet (5 mg total) by mouth Daily    flecainide  (TAMBOCOR ) 100 mg Oral Tablet Take 1 Tablet (100 mg total) by mouth Twice daily    furosemide  (LASIX ) 40 mg Oral Tablet Take 1 Tablet (40 mg total) by mouth Daily    losartan  (COZAAR ) 100 mg Oral Tablet Take 1 Tablet (  100 mg total) by mouth Every morning AND 1/2 Tablet (50 mg total) Every night.    nitroGLYCERIN  (NITROSTAT ) 0.4 mg Sublingual Tablet, Sublingual Place 1 Tablet (0.4 mg total) under the tongue Every 5 minutes as needed for Chest pain for 3 doses over 15 minutes    potassium chloride  (K-DUR) 10 mEq Oral Tab Sust.Rel. Particle/Crystal Take 1 Tablet (10 mEq total) by mouth Twice daily    rosuvastatin  (CRESTOR ) 10 mg Oral Tablet Take 1 Tablet (10 mg total) by mouth Every evening    SPIRIVA  WITH HANDIHALER 18 mcg Inhalation Capsule, w/Inhalation Device Take 1 Capsule (18 mcg total) by inhalation Daily     Family Medical History:       Problem Relation (Age of Onset)    Cancer Mother    Hypertension (High Blood Pressure) Mother, Father            Social History     Socioeconomic History    Marital status: Widowed   Tobacco Use    Smoking status: Former     Types: Cigars    Smokeless tobacco: Current     Types: Chew, Snuff   Vaping Use     Vaping status: Never Used   Substance and Sexual Activity    Alcohol use: Yes     Alcohol/week: 2.0 standard drinks of alcohol     Types: 2 Cans of beer per week     Comment: few days a week    Drug use: Never    Sexual activity: Not Currently     Social Determinants of Health     Financial Resource Strain: Low Risk (03/31/2024)    Financial Resource Strain     SDOH Financial: No   Transportation Needs: Low Risk (03/31/2024)    Transportation Needs     SDOH Transportation: No   Social Connections: Low Risk (03/31/2024)    Social Connections     SDOH Social Isolation: 5 or more times a week   Intimate Partner Violence: Low Risk (03/31/2024)    Intimate Partner Violence     SDOH Domestic Violence: No   Housing Stability: Low Risk (03/31/2024)    Housing Stability     SDOH Housing Situation: I have housing.     SDOH Housing Worry: No   Health Literacy: Low Risk (01/10/2024)    Health Literacy     SDOH Health Literacy: Never   Employment Status: Low Risk (03/31/2024)    Employment Status     SDOH Employment: Otherwise unemployed but not seeking work (ex. consulting civil engineer, retired, disabled, unpaid primary care giver)         List of Current Health Care Providers   Care Team       PCP       Name Type Specialty Phone Number    Barbie Domino, [facility] [phone]              Care Team       Name Type Specialty Phone Number    Atanacio Claude, RN Registered Nurse Not available Not available                      Health Maintenance   Topic Date Due    Tetanus-Diptheria Vaccines (1 - Tdap) 05/26/2016    RSV Adult 60+ or Pregnancy (1 - 1-dose 75+ series) Never done    Shingles Vaccine (2 of 2) 08/26/2022    Covid-19 Vaccine (Shared decision making) (6 - 2025-26 season) 02/14/2024  Hepatitis C screening  Completed    Influenza Vaccine  Completed    Medicare Annual Wellness Visit - Calendar Year Insurers  Completed    Pneumococcal Vaccination, Age 31+  Completed     Medicare Wellness Assessment      Advance  Directives   Does patient have a living will or MPOA: No           Advance directive information given to the patient today?: Patient Declined      Activities of Daily Living   Do you need help with dressing, bathing, or walking?: No   Do you need help with shopping, housekeeping, medications, or finances?: No   Do you have rugs in hallways, broken steps, or poor lighting?: No   Do you have grab bars in your bathroom, non-slip strips in your tub, and hand rails on your stairs?: Yes (handrails)   Cognitive Function Screen (1=Yes, 0=No)   What is you age?: Correct   What is the time to the nearest hour?: Correct   What is the year?: Correct   What is the name of this clinic?: Correct   Can the patient recognize two persons (the doctor, the nurse, home help, etc.)?: Correct   What is the date of your birth? (day and month sufficient) : Correct   In what year did World War II end?: Correct   Who is the current president of the [place] ?: Correct   Count from 20 down to 1?: Correct   What address did I give you earlier?: Correct   Total Score: 10       Fall Risk Screen   Do you feel unsteady when standing or walking?: No  Do you worry about falling?: No  Have you fallen in the past year?: No   Depression Screen     Little interest or pleasure in doing things.: Several Days  Feeling down, depressed, or hopeless: Several Days  PHQ 2 Total: 2     Pain Score   Pain Score:   0 - No pain    Substance Use-Abuse Screening     Tobacco Use     In Past 12 MONTHS, how often have you used any tobacco product (for example, cigarettes, e-cigarettes, cigars, pipes, or smokeless tobacco)?: Daily  In the PAST 3 MONTHS, did you smoke a cigarette containing tobacco or use any other nicotine delivery product (i.e., e-cigarette, vaping or chewing tobacco)?: Yes  In the PAST 3 MONTHS, did you usually smoke more than 10 cigarettes, vape, use an e-cigarette or chew tobacco more than 10 times each day?: No  In the PAST 3 MONTHS, did you  usually smoke/use an e-cigarette, vape or chew tobacco within 30 minutes after waking?: No     Alcohol use     In the PAST 12 MONTHS, how often have you had 5 (men)/4 (women) or more drinks containing alcohol in one day?: Never     Prescription Drug Use     In the PAST 12 months, how often have you used any prescription medications just for the feeling, more than prescribed, or that were not prescribed for you? Prescriptions may include: opioids, benzodiazepines, medications for ADHD: Never           Illicit Drug Use   In the PAST 12 MONTHS, how often have you used any drugs, including marijuana, cocaine or crack, heroin, methamphetamine, hallucinogens, ecstasy/MDMA?: Never            Urine Incontinence Screen  Urinary Incontinence Screen  Do you ever leak urine when you don't want to?: YES              OBJECTIVE:   BP 130/82   Pulse 60   Ht 1.778 m (5' 10)   Wt 116 kg (255 lb 3.2 oz)   SpO2 95%   BMI 36.62 kg/m      Physical Exam  Constitutional:       General: He is not in acute distress.     Appearance: Normal appearance.   HENT:      Head: Normocephalic and atraumatic.      Mouth/Throat:      Mouth: Mucous membranes are moist.      Pharynx: Oropharynx is clear. No posterior oropharyngeal erythema.   Eyes:      Extraocular Movements: Extraocular movements intact.      Pupils: Pupils are equal, round, and reactive to light.   Neck:      Vascular: No carotid bruit.   Cardiovascular:      Rate and Rhythm: Normal rate and regular rhythm.      Heart sounds: No murmur heard.  Pulmonary:      Effort: Pulmonary effort is normal.      Breath sounds: Normal breath sounds. No wheezing.   Abdominal:      General: Abdomen is flat. Bowel sounds are normal.      Palpations: Abdomen is soft.      Tenderness: There is no abdominal tenderness.   Musculoskeletal:         General: No swelling, tenderness or signs of injury. Normal range of motion.      Cervical back: Neck supple.   Lymphadenopathy:      Cervical: No cervical  adenopathy.   Skin:     General: Skin is warm and dry.      Findings: No rash.   Neurological:      General: No focal deficit present.      Mental Status: He is alert and oriented to person, place, and time.      Cranial Nerves: No cranial nerve deficit.   Psychiatric:         Mood and Affect: Mood normal.         Behavior: Behavior normal.            Health Maintenance Due   Topic Date Due    Tetanus-Diptheria Vaccines (1 - Tdap) 05/26/2016    RSV Adult 60+ or Pregnancy (1 - 1-dose 75+ series) Never done    Shingles Vaccine (2 of 2) 08/26/2022    Covid-19 Vaccine (Shared decision making) (6 - 2025-26 season) 02/14/2024      ASSESSMENT & PLAN:   Assessment/Plan   1. Medicare annual wellness visit, subsequent -flu shot was given today.   2. Chronic obstructive pulmonary disease, unspecified COPD type (CMS HCC) -continue Spiriva  and following up with pulmonology   3. Atrial flutter --rate is well controlled as he is not on any rate rough controlling medications.  Continue Eliquis .  No bleeding.  Continue following up with Cardiology   4. Need for influenza vaccination    5. Primary hypertension -blood pressure well controlled now with his current dose of angiotensin receptor blocker given by Cardiology   6. Bradycardia -no further bradycardia now that he is off calcium channel blocker altogether   7. Mixed hyperlipidemia -continuing new current dose of statin.  Fasting lipids are controlled      Identified Risk Factors/ Recommended  Actions         Urinary Incontinence Plan of Care: Behavioral interventions with bladder training, pelvic floor muscle training, and/or prompted voiding    Patient declined Advanced Directives information.        Orders Placed This Encounter    CANCELED: Flu Vaccine,6 month-adult,0.5 mL IM (Admin)    Flu Vaccine,6 month-adult,0.5 mL IM (Admin)    ALT (SGPT)    AST (SGOT)    BASIC METABOLIC PANEL, FASTING    LIPID PANEL    CBC    rosuvastatin  (CRESTOR ) 10 mg Oral Tablet    potassium chloride   (K-DUR) 10 mEq Oral Tab Sust.Rel. Particle/Crystal        The patient has been educated about risk factors and recommended preventive care. Written Prevention Plan completed/ updated and given to patient (see After Visit Summary).    Return in about 4 months (around 10/19/2024) for blood work first.    Lauraine Plants, DO

## 2024-06-21 NOTE — Nursing Note (Signed)
 Pt presents in office for medicare exam

## 2024-06-21 NOTE — Patient Instructions (Signed)
 Medicare Preventive Services  Medicare coverage information Recommendation for YOU   Heart Disease and Diabetes   Lipid profile every 5 years or more often if at risk for cardiovascular disease  Lab Results   Component Value Date    CHOLESTEROL 135 02/16/2024    HDLCHOL 62 02/16/2024    LDLCHOL 61 02/16/2024    TRIG 58 02/16/2024       Diabetes Screening with Blood Glucose test or Glucose Tolerance Test Yearly for those at risk for diabetes, up to two tests per year for those with prediabetes  Last Glucose: 89     Diabetes Self-Management Training   Initial training ten hours per year, and follow-up training two hours per subsequent year. Optional for those with diabetes    Medical Nutrition Therapy   Three hours of one-on-one counseling in first year, two hours in subsequent years. Optional for those with diabetes, kidney disease   Intensive Behavioral Therapy for Obesity  Face-to-face counseling, first month every week, month 2-6 every other week, month 7-12 every month if continued progress is documented Optional for those with Body Mass Index 30 or higher  Your Body mass index is 36.62 kg/m.   Tobacco Cessation (Quitting) Counseling   Two attempts per year, max 4 sessions per attempt, up to 8 sessions per year Optional for those who use tobacco    Cancer Screening Last Completion Date   Colorectal screening   For anyone age 64 to 33 or any age if high risk:  Screening Colonoscopy every 10 yrs if low risk,  more frequent if higher risk  OR  Cologuard Stool DNA test once every 3 years OR  Fecal Occult Blood Testing yearly OR  Flexible  Sigmoidoscopy  every 5 yr OR  CT Colonography every 5 yrs    See below for due date if applicable.   Prostate Cancer Screening  Prostate Specific Antigen blood test based on joint decision making with your provider for ages 25-69  A joint decision between you and your primary care provider   Lung Cancer Screening  Annual low dose computed tomography (LDCT scan) is recommended for  those age 29-80 who smoked 20 pack-years and are current smokers or quit smoking within past 15 years, after counseling by your doctor or nurse clinician about the possible benefits or harms.   See below for due date if applicable.   Vaccinations   Respiratory syncytial virus (RSV)  Age 3 years or older: Based on shared clinical decision-making with your provider.  Pneumococcal Vaccine  Recommended routinely age 16+ with one or two separate vaccines based on your risk. Recommended before age 29 if medical conditions with increased risk  Seasonal Influenza Vaccine  Once every flu season   Hepatitis B Vaccine  3 doses if risk (including anyone with diabetes or liver disease)  Shingles Vaccine  Two doses at age 11 or older  Diphtheria Tetanus Pertussis Vaccine  ONCE as adult, booster every 10 years     Immunization History   Administered Date(s) Administered    Covid-19 Vaccine,Pfizer Bivalent,80mcg/0.3ml,12 yrs+ 04/01/2021    Covid-19 Vaccine,Pfizer-BioNTech,Purple Top,46yrs+ 08/04/2019, 08/25/2019, 05/08/2020, 10/01/2020    Diptheria & Tetanus Toxoid,Adsorbed, 58YRS & OLDER 05/25/2016    High-Dose Influenza Vaccine, 65+ (FLUZONE HD) 05/25/2016, 03/02/2019, 06/07/2023    INFLUENZA VIRUS VACCINE (ADMIN) 05/25/2016, 03/26/2017, 05/07/2018    Influenza Vaccine, 6 month-adult 05/07/2018, 03/29/2020, 04/01/2021    Influenza Vaccine, 65+ (FLUAD) 03/29/2020, 04/01/2022    PREVNAR 13 01/22/2016, 05/24/2018  Pneumovax 07/14/2013    Shingrix  - Zoster Vaccine 07/01/2022     Shingles vaccine and Diphtheria Tetanus Pertussis vaccines are available at pharmacies or local health department without a prescription.   Other Preventative Screening  Last Completion Date   Glaucoma Screening   Yearly if in high risk group such as diabetes, family history, African American age 27+ or Hispanic American age 48+ See your Eye Care Provider   Hepatitis C Screening   Recommended  for those born between ages 18-79 years.  --10/28/2021  See below for due date if applicable.     HIV Testing  Recommended routinely at least ONCE, covered every year for age 90 to 48 regardless of risk, and every year for age over 49 who ask for the test or higher risk. Yearly or up to 3 times in pregnancy    See below for due date if applicable.  Bone Densitometry   Screening is recommended for Men ages 14 and above with one or more risk factor: androgen deprivation therapy for prostate cancer, hypogonadism, frailty, primary hyperparathyroidism, hyperthyroidism  For men diagnosed with osteoporosis, follow up is recommended every years or a frequency recommended by your provider     See below for due date if applicable.   Abdominal Aortic Aneurysm Screening Ultrasound   Once with a family history of abdominal aortic aneurysms OR a male between65-75 and have smoked at least 100 cigarettes in your lifetime.     See below for due date if applicable.       Your Personalized Schedule for Preventive Tests   Health Maintenance: Pending and Last Completed       Date Due Completion Date    Tetanus-Diptheria Vaccines (1 - Tdap) 05/26/2016 05/25/2016    RSV Adult 60+ or Pregnancy (1 - 1-dose 75+ series) Never done ---    Shingles Vaccine (2 of 2) 08/26/2022 07/01/2022    Influenza Vaccine (1) 02/14/2024 06/07/2023    Covid-19 Vaccine (Shared decision making) (6 - 2025-26 season) 02/14/2024 04/01/2021              For Information on Advanced Directives for Health Care:  [place]:  [email or web address]  [place], [place], [place], [place] General Information: [email or web address]

## 2024-06-22 ENCOUNTER — Encounter (INDEPENDENT_AMBULATORY_CARE_PROVIDER_SITE_OTHER): Payer: Self-pay

## 2024-06-22 ENCOUNTER — Other Ambulatory Visit: Payer: Self-pay

## 2024-06-27 ENCOUNTER — Ambulatory Visit: Admitting: Orthopedic Surgery

## 2024-06-28 ENCOUNTER — Other Ambulatory Visit: Payer: Self-pay

## 2024-06-29 ENCOUNTER — Ambulatory Visit: Payer: Self-pay

## 2024-06-29 DIAGNOSIS — Z7189 Other specified counseling: Secondary | ICD-10-CM

## 2024-06-30 ENCOUNTER — Other Ambulatory Visit: Payer: Self-pay

## 2024-06-30 ENCOUNTER — Other Ambulatory Visit (INDEPENDENT_AMBULATORY_CARE_PROVIDER_SITE_OTHER): Payer: Self-pay

## 2024-06-30 ENCOUNTER — Encounter (INDEPENDENT_AMBULATORY_CARE_PROVIDER_SITE_OTHER): Payer: Self-pay

## 2024-06-30 DIAGNOSIS — Z7189 Other specified counseling: Secondary | ICD-10-CM

## 2024-06-30 NOTE — Nursing Note (Signed)
 Population Health Management   Support for Social Needs  Social Work - Brief Note   DATE: 06/30/2024, 10:50  PATIENT NAME: Thomas [name]  MRN: [id number]  DOB: 06-20-1945  PCP: Lauraine Plants, DO  INSURANCE: Payor: [employer] / Plan: [employer] [place] [facility] / Product Type: PPO /     REASON FOR ENCOUNTER:     MSW received an urgent Support for Social Needs referral from [facility] Case Manager, Harlene re: assistance with food resources. MSW completed chart review, prior to contacting patient.   Patient answered and was available to speak with MSW. MSW introduced self, and explained reason for call. Patient reports to MSW that he resides alone, has supportive daughter that lives in [place], [place]. Patient receives home delivered meals through the [facility] in [place] 5 days a week. Patient denies food insecurity. Patient reports that he believes there may have been a misunderstanding as he was asking questions related to the [employer] Card, and inquired if he could buy food with it. Patient aware that this is not a benefit. Patient confirms that he has food in his house, and is not in need. MSW did educate patient if a need arouse, [place] has a food pantry every second Thursday at the [employer] from 12-2. Patient verbalized understanding and appreciative of the information. Patient reports no social needs at this time. Educated patient on [employer] Social Work services if there ever is a need, explained that he can inform his [facility], or PCP to make a new referral.       Burnard Mullet, MSW  06/30/2024, 10:50  Social Worker, [employer]

## 2024-07-06 ENCOUNTER — Other Ambulatory Visit (INDEPENDENT_AMBULATORY_CARE_PROVIDER_SITE_OTHER): Payer: Self-pay

## 2024-07-06 DIAGNOSIS — Z7189 Other specified counseling: Secondary | ICD-10-CM

## 2024-07-06 NOTE — Nursing Note (Signed)
 POPULATION HEALTH    COMPLEX CARE MANAGEMENT    Chronic Care Management - CCM  Status: Enrolled  Effective Dates: 01/10/2024 - present  Responsible Staff: Kayd Launer, RN          Patient reports to  be doing well. He saw PCP a week ago recently . His blood work looked good PCP increased Potassium. CO2 levels were down a bit patient does have mild COPD instructed to take deep breaths. Patient did stop Spiriva  he stated his cough and dry mouth have since stopped. He does not feel any worse after stopping he does still have his rescue inhaler. Patient BP stable 130/82. Patient Denies Chest pain and SOB. Patient continues healthy diet and activity. He is going to go outside and get fresh air and cut wood. Patient is ready for impeding weather he does have a wood burner. Patient has no complaints with [facility] and [facility]. Al has been going well. [facility] confirmed upcoming urology appointment on 1/28. Patient still unsure if he wants to go through with [facility]. He did confirm he got [facility] 's education send previously. He plans to discuss with Dr. Annell. No other needs at this time. [facility] will continue to follow patient.     Care Management Tasks Completed:  Chart review completed.  Social determinants of health reviewed and updated as needed.  The following Care plan  ON Cardiac, Safety, COPD were reviewed/discussed/updated.  Interventions ongoing and updated as needed.  Goals addressed and updated.  Interventions ongoing and updated as needed.  The following Education  On HTN, COPD, Medications , and safety were completed.  Self management plan reviewed and updated.  See patient care coordination note.  Barriers to health, care plan, and goals reviewed.  Interventions ongoing and updated as needed.  The following Health maintenance/care gaps Vaccinations were  reviewed and discussed with patient.  Health maintenance/care gaps updated as applicable.  Discussed upcoming appointment dates and times.  Reinforced importance  of keeping all provider appointments.  Reinforced the importance of taking all medications as prescribed.  Medical history, surgical history, hospitalizations, and medications reviewed and updated as applicable.    Ongoing and Updated Assessment Interventions:  Discussed PCP appointment  Encouraged BP Checks   Encouraged deep breaths   Confirmed upcoming appointments   Reviewed recent Blood work     Case Manager To Do List for the Next Interaction:  Monthly follow up  F/U Urology  F/U BP   F/U COPD  F/U General Health     Plan to call patient in ~ one month to reassess and update plan of care.  Instructed patient to call with change in symptoms or as needed prior to next follow up.      Ryan Adler, RN

## 2024-07-12 ENCOUNTER — Ambulatory Visit (INDEPENDENT_AMBULATORY_CARE_PROVIDER_SITE_OTHER): Payer: Self-pay | Admitting: Urology

## 2024-07-13 ENCOUNTER — Other Ambulatory Visit (INDEPENDENT_AMBULATORY_CARE_PROVIDER_SITE_OTHER): Payer: Self-pay

## 2024-07-13 ENCOUNTER — Telehealth: Payer: Self-pay

## 2024-07-13 DIAGNOSIS — I4892 Unspecified atrial flutter: Secondary | ICD-10-CM

## 2024-07-13 DIAGNOSIS — Z7189 Other specified counseling: Secondary | ICD-10-CM

## 2024-07-13 DIAGNOSIS — I10 Essential (primary) hypertension: Secondary | ICD-10-CM

## 2024-07-13 NOTE — Telephone Encounter (Signed)
 Copied from CRM [id number]. Topic: Clinical - Medication Question >> Jul 13, 2024  1:17 PM Avram MATSU wrote: Reason for CRM: patient stated the provider need to send a notice that amiodarone  (PACERONE ) 200 MG tablet [[id number]] is that medication the patient should be taking faxed to [facility]. Please advise [phone]  Fax:[phone] MD Louella Harlene

## 2024-07-13 NOTE — Progress Notes (Signed)
 Lauraine Plants, DO      This patient has met the requirements to bill for Chronic Care Management services this month. Please sign this encounter as you typically would any other encounter. There is NO SPECIAL ATTESTATION needed . COSIGN ONLY. Please let us  know if you have any questions at all.    Chronic Care Management Time Documentation on 07/13/2024 13:57.   Time spent during current encounter is 1 minutes.   Cumulative time during current month's episode (month-to-date) is 40 minutes.          ICD-10-CM    1. Encounter for counseling for care management of patient with chronic conditions and complex health needs using nurse-based model  Z71.89       2. Essential hypertension  I10       3. Atrial flutter, unspecified type (CMS HCC)  I48.92             acetaminophen (TYLENOL) 500 mg Oral Tablet, Take 1 Tablet (500 mg total) by mouth Every 4 hours as needed for Pain (pt takes tylenol pm)  alfuzosin  (UROXATRAL ) 10 mg Oral Tablet Sustained Release 24 hr, Take 1 Tablet (10 mg total) by mouth Daily  apixaban  (ELIQUIS ) 5 mg Oral Tablet, Take 1 Tablet (5 mg total) by mouth Twice daily  finasteride  (PROSCAR ) 5 mg Oral Tablet, Take 1 Tablet (5 mg total) by mouth Daily  flecainide  (TAMBOCOR ) 100 mg Oral Tablet, Take 1 Tablet (100 mg total) by mouth Twice daily  furosemide  (LASIX ) 40 mg Oral Tablet, Take 1 Tablet (40 mg total) by mouth Daily  losartan  (COZAAR ) 100 mg Oral Tablet, Take 1 Tablet (100 mg total) by mouth Every morning AND 1/2 Tablet (50 mg total) Every night.  nitroGLYCERIN  (NITROSTAT ) 0.4 mg Sublingual Tablet, Sublingual, Place 1 Tablet (0.4 mg total) under the tongue Every 5 minutes as needed for Chest pain for 3 doses over 15 minutes  potassium chloride  (K-DUR) 10 mEq Oral Tab Sust.Rel. Particle/Crystal, Take 1 Tablet (10 mEq total) by mouth Twice daily  rosuvastatin  (CRESTOR ) 10 mg Oral Tablet, Take 1 Tablet (10 mg total) by mouth Every evening  SPIRIVA  WITH HANDIHALER 18 mcg Inhalation Capsule, w/Inhalation  Device, Take 1 Capsule (18 mcg total) by inhalation Daily    No facility-administered medications prior to visit.        Thanks,  Your Chronic Care Management Team

## 2024-07-14 NOTE — Telephone Encounter (Signed)
 Pt is aware of Dr. Marcel recommendations.

## 2024-07-14 NOTE — Telephone Encounter (Signed)
 This note should come from his cardiologist who prescribes this for him so have him call cardiology for this.

## 2024-07-19 ENCOUNTER — Encounter (HOSPITAL_COMMUNITY): Payer: Self-pay

## 2024-07-19 ENCOUNTER — Encounter (INDEPENDENT_AMBULATORY_CARE_PROVIDER_SITE_OTHER): Payer: Self-pay

## 2024-07-21 ENCOUNTER — Telehealth: Payer: Self-pay | Admitting: Cardiology

## 2024-07-21 NOTE — Telephone Encounter (Signed)
 Pt c/o medication issue:  1. Name of Medication:   amiodarone  (PACERONE ) 200 MG tablet    2. How are you currently taking this medication (dosage and times per day)? As written   3. Are you having a reaction (difficulty breathing--STAT)? No   4. What is your medication issue? [facility] Jeffrie that its okay for them to prescribe this medication for him.   Dr. DOROTHA Bride  Phone number: [phone]

## 2024-09-29 ENCOUNTER — Ambulatory Visit (INDEPENDENT_AMBULATORY_CARE_PROVIDER_SITE_OTHER): Payer: Self-pay | Admitting: Medical

## 2024-10-25 ENCOUNTER — Encounter (INDEPENDENT_AMBULATORY_CARE_PROVIDER_SITE_OTHER): Payer: Self-pay | Admitting: Family Medicine

## 2024-11-15 ENCOUNTER — Ambulatory Visit (HOSPITAL_COMMUNITY): Payer: Self-pay
